# Patient Record
Sex: Female | Born: 1965 | Race: White | Hispanic: No | Marital: Married | Smoking: Former smoker
Health system: Southern US, Community
[De-identification: ages and names within clinical notes are randomized; demographics above are authoritative.]

## PROBLEM LIST (undated history)

## (undated) DIAGNOSIS — Z9989 Dependence on other enabling machines and devices: Secondary | ICD-10-CM

## (undated) DIAGNOSIS — M722 Plantar fascial fibromatosis: Secondary | ICD-10-CM

## (undated) DIAGNOSIS — G5601 Carpal tunnel syndrome, right upper limb: Secondary | ICD-10-CM

## (undated) DIAGNOSIS — I10 Essential (primary) hypertension: Secondary | ICD-10-CM

## (undated) DIAGNOSIS — G4733 Obstructive sleep apnea (adult) (pediatric): Secondary | ICD-10-CM

## (undated) DIAGNOSIS — G8929 Other chronic pain: Secondary | ICD-10-CM

## (undated) DIAGNOSIS — K8020 Calculus of gallbladder without cholecystitis without obstruction: Secondary | ICD-10-CM

## (undated) DIAGNOSIS — E1159 Type 2 diabetes mellitus with other circulatory complications: Secondary | ICD-10-CM

## (undated) DIAGNOSIS — I499 Cardiac arrhythmia, unspecified: Secondary | ICD-10-CM

## (undated) DIAGNOSIS — F419 Anxiety disorder, unspecified: Secondary | ICD-10-CM

## (undated) DIAGNOSIS — M1990 Unspecified osteoarthritis, unspecified site: Secondary | ICD-10-CM

## (undated) DIAGNOSIS — M47816 Spondylosis without myelopathy or radiculopathy, lumbar region: Secondary | ICD-10-CM

## (undated) DIAGNOSIS — F988 Other specified behavioral and emotional disorders with onset usually occurring in childhood and adolescence: Secondary | ICD-10-CM

## (undated) DIAGNOSIS — R413 Other amnesia: Secondary | ICD-10-CM

## (undated) DIAGNOSIS — M4800 Spinal stenosis, site unspecified: Secondary | ICD-10-CM

## (undated) DIAGNOSIS — O903 Peripartum cardiomyopathy: Secondary | ICD-10-CM

## (undated) DIAGNOSIS — E1169 Type 2 diabetes mellitus with other specified complication: Secondary | ICD-10-CM

## (undated) DIAGNOSIS — E1165 Type 2 diabetes mellitus with hyperglycemia: Secondary | ICD-10-CM

## (undated) DIAGNOSIS — R0600 Dyspnea, unspecified: Secondary | ICD-10-CM

## (undated) DIAGNOSIS — I428 Other cardiomyopathies: Secondary | ICD-10-CM

## (undated) DIAGNOSIS — Z87891 Personal history of nicotine dependence: Secondary | ICD-10-CM

## (undated) DIAGNOSIS — G43909 Migraine, unspecified, not intractable, without status migrainosus: Secondary | ICD-10-CM

## (undated) DIAGNOSIS — F32A Depression, unspecified: Secondary | ICD-10-CM

## (undated) DIAGNOSIS — Z6841 Body Mass Index (BMI) 40.0 and over, adult: Secondary | ICD-10-CM

## (undated) DIAGNOSIS — Z0389 Encounter for observation for other suspected diseases and conditions ruled out: Secondary | ICD-10-CM

## (undated) DIAGNOSIS — I509 Heart failure, unspecified: Secondary | ICD-10-CM

## (undated) DIAGNOSIS — F329 Major depressive disorder, single episode, unspecified: Secondary | ICD-10-CM

## (undated) DIAGNOSIS — I5022 Chronic systolic (congestive) heart failure: Secondary | ICD-10-CM

## (undated) DIAGNOSIS — E785 Hyperlipidemia, unspecified: Secondary | ICD-10-CM

## (undated) HISTORY — DX: Anxiety disorder, unspecified: F41.9

## (undated) HISTORY — DX: Type 2 diabetes mellitus with other specified complication: E11.69

## (undated) HISTORY — DX: Other specified behavioral and emotional disorders with onset usually occurring in childhood and adolescence: F98.8

## (undated) HISTORY — DX: Other cardiomyopathies: I42.8

## (undated) HISTORY — PX: OTHER SURGICAL HISTORY: SHX169

## (undated) HISTORY — DX: Type 2 diabetes mellitus with other circulatory complications: E11.59

## (undated) HISTORY — DX: Spondylosis without myelopathy or radiculopathy, lumbar region: M47.816

## (undated) HISTORY — DX: Body Mass Index (BMI) 40.0 and over, adult: Z684

## (undated) HISTORY — PX: CHOLECYSTECTOMY: SHX55

## (undated) HISTORY — DX: Morbid (severe) obesity due to excess calories: E66.01

## (undated) HISTORY — DX: Depression, unspecified: F32.A

## (undated) HISTORY — DX: Migraine, unspecified, not intractable, without status migrainosus: G43.909

## (undated) HISTORY — DX: Essential (primary) hypertension: I10

## (undated) HISTORY — DX: Spinal stenosis, site unspecified: M48.00

## (undated) HISTORY — DX: Obstructive sleep apnea (adult) (pediatric): G47.33

## (undated) HISTORY — DX: Peripartum cardiomyopathy: O90.3

## (undated) HISTORY — DX: Hyperlipidemia, unspecified: E78.5

## (undated) HISTORY — DX: Type 2 diabetes mellitus with hyperglycemia: E11.65

## (undated) HISTORY — DX: Major depressive disorder, single episode, unspecified: F32.9

## (undated) HISTORY — DX: Personal history of nicotine dependence: Z87.891

## (undated) HISTORY — DX: Dependence on other enabling machines and devices: Z99.89

## (undated) HISTORY — DX: Carpal tunnel syndrome, right upper limb: G56.01

## (undated) HISTORY — DX: Plantar fascial fibromatosis: M72.2

## (undated) HISTORY — DX: Encounter for observation for other suspected diseases and conditions ruled out: Z03.89

---

## 2002-04-28 ENCOUNTER — Encounter: Admission: RE | Admit: 2002-04-28 | Discharge: 2002-04-28 | Payer: Self-pay | Admitting: Internal Medicine

## 2002-05-03 ENCOUNTER — Inpatient Hospital Stay (HOSPITAL_COMMUNITY): Admission: AD | Admit: 2002-05-03 | Discharge: 2002-05-03 | Payer: Self-pay | Admitting: Obstetrics and Gynecology

## 2002-05-07 ENCOUNTER — Ambulatory Visit (HOSPITAL_COMMUNITY): Admission: RE | Admit: 2002-05-07 | Discharge: 2002-05-07 | Payer: Self-pay | Admitting: Internal Medicine

## 2002-05-10 ENCOUNTER — Encounter: Admission: RE | Admit: 2002-05-10 | Discharge: 2002-05-10 | Payer: Self-pay | Admitting: Internal Medicine

## 2002-05-12 ENCOUNTER — Encounter: Admission: RE | Admit: 2002-05-12 | Discharge: 2002-05-12 | Payer: Self-pay | Admitting: Internal Medicine

## 2002-05-31 ENCOUNTER — Encounter: Admission: RE | Admit: 2002-05-31 | Discharge: 2002-05-31 | Payer: Self-pay | Admitting: Internal Medicine

## 2002-07-19 ENCOUNTER — Encounter: Admission: RE | Admit: 2002-07-19 | Discharge: 2002-07-19 | Payer: Self-pay | Admitting: Internal Medicine

## 2002-08-31 ENCOUNTER — Encounter: Admission: RE | Admit: 2002-08-31 | Discharge: 2002-08-31 | Payer: Self-pay | Admitting: Internal Medicine

## 2002-09-13 ENCOUNTER — Encounter: Admission: RE | Admit: 2002-09-13 | Discharge: 2002-09-13 | Payer: Self-pay | Admitting: Internal Medicine

## 2002-09-16 ENCOUNTER — Emergency Department (HOSPITAL_COMMUNITY): Admission: EM | Admit: 2002-09-16 | Discharge: 2002-09-16 | Payer: Self-pay | Admitting: *Deleted

## 2002-09-16 ENCOUNTER — Encounter: Payer: Self-pay | Admitting: Emergency Medicine

## 2002-09-20 ENCOUNTER — Encounter (INDEPENDENT_AMBULATORY_CARE_PROVIDER_SITE_OTHER): Payer: Self-pay | Admitting: Specialist

## 2002-09-20 ENCOUNTER — Ambulatory Visit (HOSPITAL_COMMUNITY): Admission: RE | Admit: 2002-09-20 | Discharge: 2002-09-20 | Payer: Self-pay | Admitting: Obstetrics & Gynecology

## 2002-09-29 ENCOUNTER — Encounter: Admission: RE | Admit: 2002-09-29 | Discharge: 2002-09-29 | Payer: Self-pay | Admitting: Obstetrics and Gynecology

## 2002-11-16 ENCOUNTER — Encounter: Admission: RE | Admit: 2002-11-16 | Discharge: 2002-11-16 | Payer: Self-pay | Admitting: Obstetrics and Gynecology

## 2002-12-14 ENCOUNTER — Encounter: Admission: RE | Admit: 2002-12-14 | Discharge: 2002-12-14 | Payer: Self-pay | Admitting: Obstetrics and Gynecology

## 2002-12-20 ENCOUNTER — Encounter: Admission: RE | Admit: 2002-12-20 | Discharge: 2002-12-20 | Payer: Self-pay | Admitting: Internal Medicine

## 2003-02-10 ENCOUNTER — Encounter: Admission: RE | Admit: 2003-02-10 | Discharge: 2003-02-10 | Payer: Self-pay | Admitting: Internal Medicine

## 2003-05-19 ENCOUNTER — Encounter: Admission: RE | Admit: 2003-05-19 | Discharge: 2003-05-19 | Payer: Self-pay | Admitting: Internal Medicine

## 2003-11-17 ENCOUNTER — Ambulatory Visit: Payer: Self-pay | Admitting: Internal Medicine

## 2003-11-23 ENCOUNTER — Ambulatory Visit: Payer: Self-pay | Admitting: Cardiology

## 2003-12-19 ENCOUNTER — Ambulatory Visit: Payer: Self-pay | Admitting: Internal Medicine

## 2003-12-20 ENCOUNTER — Ambulatory Visit: Payer: Self-pay | Admitting: Obstetrics and Gynecology

## 2004-01-10 ENCOUNTER — Ambulatory Visit: Payer: Self-pay | Admitting: Internal Medicine

## 2004-02-21 ENCOUNTER — Emergency Department (HOSPITAL_COMMUNITY): Admission: EM | Admit: 2004-02-21 | Discharge: 2004-02-21 | Payer: Self-pay | Admitting: Family Medicine

## 2004-04-19 ENCOUNTER — Ambulatory Visit: Payer: Self-pay | Admitting: Internal Medicine

## 2004-05-02 ENCOUNTER — Ambulatory Visit: Payer: Self-pay | Admitting: Internal Medicine

## 2004-05-21 ENCOUNTER — Ambulatory Visit: Payer: Self-pay | Admitting: Cardiology

## 2004-06-16 ENCOUNTER — Emergency Department (HOSPITAL_COMMUNITY): Admission: EM | Admit: 2004-06-16 | Discharge: 2004-06-16 | Payer: Self-pay | Admitting: Family Medicine

## 2004-06-19 ENCOUNTER — Ambulatory Visit: Payer: Self-pay

## 2004-06-30 ENCOUNTER — Emergency Department (HOSPITAL_COMMUNITY): Admission: EM | Admit: 2004-06-30 | Discharge: 2004-06-30 | Payer: Self-pay | Admitting: Family Medicine

## 2004-07-02 ENCOUNTER — Ambulatory Visit: Payer: Self-pay | Admitting: Internal Medicine

## 2004-08-22 ENCOUNTER — Inpatient Hospital Stay (HOSPITAL_COMMUNITY): Admission: AD | Admit: 2004-08-22 | Discharge: 2004-08-22 | Payer: Self-pay | Admitting: *Deleted

## 2004-08-30 ENCOUNTER — Ambulatory Visit: Payer: Self-pay | Admitting: Obstetrics and Gynecology

## 2004-09-30 ENCOUNTER — Emergency Department (HOSPITAL_COMMUNITY): Admission: EM | Admit: 2004-09-30 | Discharge: 2004-09-30 | Payer: Self-pay | Admitting: Emergency Medicine

## 2004-10-11 ENCOUNTER — Ambulatory Visit: Payer: Self-pay | Admitting: *Deleted

## 2004-12-31 ENCOUNTER — Ambulatory Visit: Payer: Self-pay | Admitting: Cardiology

## 2005-01-03 ENCOUNTER — Ambulatory Visit: Payer: Self-pay | Admitting: Internal Medicine

## 2005-03-08 ENCOUNTER — Ambulatory Visit: Payer: Self-pay | Admitting: Internal Medicine

## 2005-03-23 ENCOUNTER — Emergency Department (HOSPITAL_COMMUNITY): Admission: EM | Admit: 2005-03-23 | Discharge: 2005-03-23 | Payer: Self-pay | Admitting: Emergency Medicine

## 2005-04-05 ENCOUNTER — Ambulatory Visit: Payer: Self-pay | Admitting: Family Medicine

## 2005-04-22 ENCOUNTER — Ambulatory Visit: Payer: Self-pay | Admitting: Internal Medicine

## 2005-04-25 ENCOUNTER — Encounter (INDEPENDENT_AMBULATORY_CARE_PROVIDER_SITE_OTHER): Payer: Self-pay | Admitting: Internal Medicine

## 2005-04-25 LAB — CONVERTED CEMR LAB: Pap Smear: NORMAL

## 2005-06-03 ENCOUNTER — Ambulatory Visit (HOSPITAL_COMMUNITY): Admission: RE | Admit: 2005-06-03 | Discharge: 2005-06-03 | Payer: Self-pay | Admitting: *Deleted

## 2005-06-05 ENCOUNTER — Encounter: Admission: RE | Admit: 2005-06-05 | Discharge: 2005-06-05 | Payer: Self-pay

## 2005-06-17 ENCOUNTER — Ambulatory Visit: Payer: Self-pay | Admitting: Cardiology

## 2005-09-06 ENCOUNTER — Ambulatory Visit: Payer: Self-pay | Admitting: Internal Medicine

## 2005-12-27 ENCOUNTER — Encounter: Admission: RE | Admit: 2005-12-27 | Discharge: 2005-12-27 | Payer: Self-pay | Admitting: Obstetrics and Gynecology

## 2006-01-02 ENCOUNTER — Ambulatory Visit: Payer: Self-pay | Admitting: Internal Medicine

## 2006-01-09 ENCOUNTER — Ambulatory Visit: Payer: Self-pay | Admitting: Cardiology

## 2006-04-28 ENCOUNTER — Ambulatory Visit: Payer: Self-pay | Admitting: Internal Medicine

## 2006-05-26 ENCOUNTER — Ambulatory Visit: Payer: Self-pay | Admitting: Internal Medicine

## 2006-06-05 ENCOUNTER — Encounter: Admission: RE | Admit: 2006-06-05 | Discharge: 2006-06-05 | Payer: Self-pay | Admitting: Internal Medicine

## 2006-07-04 ENCOUNTER — Ambulatory Visit: Payer: Self-pay | Admitting: Cardiology

## 2006-07-04 LAB — CONVERTED CEMR LAB
BUN: 18 mg/dL (ref 6–23)
CO2: 29 meq/L (ref 19–32)
Calcium: 9.1 mg/dL (ref 8.4–10.5)
Chloride: 108 meq/L (ref 96–112)
Cholesterol: 163 mg/dL (ref 0–200)
Creatinine, Ser: 0.8 mg/dL (ref 0.4–1.2)
GFR calc Af Amer: 102 mL/min
GFR calc non Af Amer: 84 mL/min
Glucose, Bld: 92 mg/dL (ref 70–99)
HDL: 37.9 mg/dL — ABNORMAL LOW (ref >39.0)
LDL Cholesterol: 109 mg/dL — ABNORMAL HIGH (ref 0–99)
Potassium: 3.8 meq/L (ref 3.5–5.1)
Sodium: 141 meq/L (ref 135–145)
Total CHOL/HDL Ratio: 4.3
Triglycerides: 83 mg/dL (ref 0–149)
VLDL: 17 mg/dL (ref 0–40)

## 2006-07-12 ENCOUNTER — Encounter (INDEPENDENT_AMBULATORY_CARE_PROVIDER_SITE_OTHER): Payer: Self-pay | Admitting: Internal Medicine

## 2006-07-12 DIAGNOSIS — G43909 Migraine, unspecified, not intractable, without status migrainosus: Secondary | ICD-10-CM | POA: Insufficient documentation

## 2006-07-12 DIAGNOSIS — Z6841 Body Mass Index (BMI) 40.0 and over, adult: Secondary | ICD-10-CM

## 2006-07-12 DIAGNOSIS — F988 Other specified behavioral and emotional disorders with onset usually occurring in childhood and adolescence: Secondary | ICD-10-CM | POA: Insufficient documentation

## 2006-07-12 DIAGNOSIS — I428 Other cardiomyopathies: Secondary | ICD-10-CM

## 2006-07-12 HISTORY — DX: Other cardiomyopathies: I42.8

## 2006-07-12 HISTORY — DX: Morbid (severe) obesity due to excess calories: E66.01

## 2006-07-15 ENCOUNTER — Ambulatory Visit: Payer: Self-pay | Admitting: Cardiology

## 2006-07-25 ENCOUNTER — Ambulatory Visit: Payer: Self-pay | Admitting: Cardiology

## 2006-09-19 ENCOUNTER — Emergency Department (HOSPITAL_COMMUNITY): Admission: EM | Admit: 2006-09-19 | Discharge: 2006-09-19 | Payer: Self-pay | Admitting: Emergency Medicine

## 2006-10-02 ENCOUNTER — Other Ambulatory Visit: Payer: Self-pay

## 2006-10-03 ENCOUNTER — Inpatient Hospital Stay (HOSPITAL_COMMUNITY): Admission: AD | Admit: 2006-10-03 | Discharge: 2006-10-06 | Payer: Self-pay | Admitting: Psychiatry

## 2006-10-03 ENCOUNTER — Other Ambulatory Visit: Payer: Self-pay | Admitting: Emergency Medicine

## 2006-10-03 ENCOUNTER — Ambulatory Visit: Payer: Self-pay | Admitting: Psychiatry

## 2006-10-24 ENCOUNTER — Ambulatory Visit (HOSPITAL_COMMUNITY): Admission: RE | Admit: 2006-10-24 | Discharge: 2006-10-24 | Payer: Self-pay | Admitting: Internal Medicine

## 2006-12-11 ENCOUNTER — Encounter: Admission: RE | Admit: 2006-12-11 | Discharge: 2006-12-11 | Payer: Self-pay | Admitting: Internal Medicine

## 2007-01-05 ENCOUNTER — Ambulatory Visit: Payer: Self-pay | Admitting: Cardiology

## 2007-01-13 ENCOUNTER — Ambulatory Visit: Payer: Self-pay

## 2007-01-13 ENCOUNTER — Encounter: Payer: Self-pay | Admitting: Cardiology

## 2007-02-12 ENCOUNTER — Ambulatory Visit: Payer: Self-pay | Admitting: Internal Medicine

## 2007-02-12 LAB — CONVERTED CEMR LAB
ALT: 18 units/L (ref 0–35)
AST: 21 units/L (ref 0–37)
Albumin: 4.3 g/dL (ref 3.5–5.2)
Alkaline Phosphatase: 36 units/L — ABNORMAL LOW (ref 39–117)
BUN: 15 mg/dL (ref 6–23)
Basophils Absolute: 0.1 10*3/uL (ref 0.0–0.1)
Basophils Relative: 1 % (ref 0–1)
CO2: 24 meq/L (ref 19–32)
Calcium: 9.3 mg/dL (ref 8.4–10.5)
Chloride: 104 meq/L (ref 96–112)
Cholesterol: 187 mg/dL (ref 0–200)
Creatinine, Ser: 0.75 mg/dL (ref 0.40–1.20)
Eosinophils Absolute: 0.2 10*3/uL (ref 0.2–0.7)
Eosinophils Relative: 2 % (ref 0–5)
Glucose, Bld: 77 mg/dL (ref 70–99)
HCT: 47.3 % — ABNORMAL HIGH (ref 36.0–46.0)
HDL: 62 mg/dL (ref >39)
Hemoglobin: 15.6 g/dL — ABNORMAL HIGH (ref 12.0–15.0)
LDL Cholesterol: 104 mg/dL — ABNORMAL HIGH (ref 0–99)
Lymphocytes Relative: 28 % (ref 12–46)
Lymphs Abs: 2.9 10*3/uL (ref 0.7–4.0)
MCHC: 33 g/dL (ref 30.0–36.0)
MCV: 89.8 fL (ref 78.0–100.0)
Monocytes Absolute: 0.7 10*3/uL (ref 0.1–1.0)
Monocytes Relative: 7 % (ref 3–12)
Neutro Abs: 6.3 10*3/uL (ref 1.7–7.7)
Neutrophils Relative %: 63 % (ref 43–77)
Platelets: 269 10*3/uL (ref 150–400)
Potassium: 4.3 meq/L (ref 3.5–5.3)
RBC: 5.27 M/uL — ABNORMAL HIGH (ref 3.87–5.11)
RDW: 14.2 % (ref 11.5–15.5)
Sodium: 140 meq/L (ref 135–145)
TSH: 1.671 microintl units/mL (ref 0.350–5.50)
Total Bilirubin: 0.6 mg/dL (ref 0.3–1.2)
Total CHOL/HDL Ratio: 3
Total Protein: 7.3 g/dL (ref 6.0–8.3)
Triglycerides: 103 mg/dL (ref <150)
VLDL: 21 mg/dL (ref 0–40)
WBC: 10 10*3/uL (ref 4.0–10.5)

## 2007-04-09 ENCOUNTER — Ambulatory Visit: Payer: Self-pay | Admitting: Internal Medicine

## 2007-04-09 ENCOUNTER — Encounter: Payer: Self-pay | Admitting: Internal Medicine

## 2007-04-09 LAB — CONVERTED CEMR LAB
Chlamydia, DNA Probe: NEGATIVE
GC Probe Amp, Genital: NEGATIVE

## 2007-04-30 ENCOUNTER — Ambulatory Visit: Payer: Self-pay | Admitting: Internal Medicine

## 2007-05-18 ENCOUNTER — Ambulatory Visit: Payer: Self-pay | Admitting: Cardiology

## 2007-05-18 LAB — CONVERTED CEMR LAB: TSH: 2.92 microintl units/mL (ref 0.35–5.50)

## 2007-05-25 ENCOUNTER — Emergency Department (HOSPITAL_COMMUNITY): Admission: EM | Admit: 2007-05-25 | Discharge: 2007-05-25 | Payer: Self-pay | Admitting: Emergency Medicine

## 2007-06-18 ENCOUNTER — Encounter: Admission: RE | Admit: 2007-06-18 | Discharge: 2007-06-18 | Payer: Self-pay | Admitting: Internal Medicine

## 2007-06-23 ENCOUNTER — Ambulatory Visit: Payer: Self-pay | Admitting: Cardiology

## 2007-06-23 LAB — CONVERTED CEMR LAB
BUN: 18 mg/dL (ref 6–23)
CO2: 29 meq/L (ref 19–32)
Calcium: 9.8 mg/dL (ref 8.4–10.5)
Chloride: 106 meq/L (ref 96–112)
Creatinine, Ser: 0.8 mg/dL (ref 0.4–1.2)
GFR calc Af Amer: 101 mL/min
GFR calc non Af Amer: 84 mL/min
Glucose, Bld: 83 mg/dL (ref 70–99)
Magnesium: 2.2 mg/dL (ref 1.5–2.5)
Potassium: 4.4 meq/L (ref 3.5–5.1)
Sodium: 140 meq/L (ref 135–145)

## 2007-07-01 ENCOUNTER — Ambulatory Visit: Payer: Self-pay | Admitting: Internal Medicine

## 2007-07-12 ENCOUNTER — Emergency Department (HOSPITAL_COMMUNITY): Admission: EM | Admit: 2007-07-12 | Discharge: 2007-07-12 | Payer: Self-pay | Admitting: Family Medicine

## 2007-07-14 ENCOUNTER — Emergency Department (HOSPITAL_COMMUNITY): Admission: EM | Admit: 2007-07-14 | Discharge: 2007-07-14 | Payer: Self-pay | Admitting: Emergency Medicine

## 2007-11-24 ENCOUNTER — Ambulatory Visit: Payer: Self-pay | Admitting: Internal Medicine

## 2007-12-04 ENCOUNTER — Ambulatory Visit: Payer: Self-pay | Admitting: Cardiology

## 2008-04-14 ENCOUNTER — Ambulatory Visit: Payer: Self-pay | Admitting: Internal Medicine

## 2008-04-14 LAB — CONVERTED CEMR LAB
ALT: 24 units/L (ref 0–35)
AST: 22 units/L (ref 0–37)
Albumin: 4.1 g/dL (ref 3.5–5.2)
Alkaline Phosphatase: 35 units/L — ABNORMAL LOW (ref 39–117)
BUN: 20 mg/dL (ref 6–23)
Basophils Absolute: 0.1 10*3/uL (ref 0.0–0.1)
Basophils Relative: 1 % (ref 0–1)
CO2: 25 meq/L (ref 19–32)
Calcium: 9.7 mg/dL (ref 8.4–10.5)
Chloride: 105 meq/L (ref 96–112)
Cholesterol: 241 mg/dL — ABNORMAL HIGH (ref 0–200)
Creatinine, Ser: 0.72 mg/dL (ref 0.40–1.20)
Eosinophils Absolute: 0.2 10*3/uL (ref 0.0–0.7)
Eosinophils Relative: 2 % (ref 0–5)
Glucose, Bld: 129 mg/dL — ABNORMAL HIGH (ref 70–99)
HCT: 47.4 % — ABNORMAL HIGH (ref 36.0–46.0)
HDL: 67 mg/dL (ref >39)
Hemoglobin: 15.3 g/dL — ABNORMAL HIGH (ref 12.0–15.0)
LDL Cholesterol: 146 mg/dL — ABNORMAL HIGH (ref 0–99)
Lymphocytes Relative: 21 % (ref 12–46)
Lymphs Abs: 2 10*3/uL (ref 0.7–4.0)
MCHC: 32.3 g/dL (ref 30.0–36.0)
MCV: 89.9 fL (ref 78.0–100.0)
Monocytes Absolute: 0.6 10*3/uL (ref 0.1–1.0)
Monocytes Relative: 6 % (ref 3–12)
Neutro Abs: 6.5 10*3/uL (ref 1.7–7.7)
Neutrophils Relative %: 70 % (ref 43–77)
Platelets: 259 10*3/uL (ref 150–400)
Potassium: 4.2 meq/L (ref 3.5–5.3)
RBC: 5.27 M/uL — ABNORMAL HIGH (ref 3.87–5.11)
RDW: 14.4 % (ref 11.5–15.5)
Sodium: 141 meq/L (ref 135–145)
TSH: 3.656 microintl units/mL (ref 0.350–4.50)
Total Bilirubin: 0.6 mg/dL (ref 0.3–1.2)
Total CHOL/HDL Ratio: 3.6
Total Protein: 7.4 g/dL (ref 6.0–8.3)
Triglycerides: 142 mg/dL (ref <150)
VLDL: 28 mg/dL (ref 0–40)
WBC: 9.3 10*3/uL (ref 4.0–10.5)

## 2008-05-09 ENCOUNTER — Ambulatory Visit: Payer: Self-pay | Admitting: Internal Medicine

## 2008-05-09 LAB — CONVERTED CEMR LAB
ALT: 20 units/L (ref 0–35)
AST: 21 units/L (ref 0–37)
Albumin: 4.1 g/dL (ref 3.5–5.2)
Alkaline Phosphatase: 36 units/L — ABNORMAL LOW (ref 39–117)
BUN: 12 mg/dL (ref 6–23)
Basophils Absolute: 0.1 10*3/uL (ref 0.0–0.1)
Basophils Relative: 1 % (ref 0–1)
CO2: 24 meq/L (ref 19–32)
Calcium: 9.4 mg/dL (ref 8.4–10.5)
Chloride: 104 meq/L (ref 96–112)
Cholesterol: 237 mg/dL — ABNORMAL HIGH (ref 0–200)
Creatinine, Ser: 0.72 mg/dL (ref 0.40–1.20)
Eosinophils Absolute: 0.2 10*3/uL (ref 0.0–0.7)
Eosinophils Relative: 2 % (ref 0–5)
Glucose, Bld: 89 mg/dL (ref 70–99)
HCT: 47.5 % — ABNORMAL HIGH (ref 36.0–46.0)
HDL: 62 mg/dL (ref >39)
Hemoglobin: 15 g/dL (ref 12.0–15.0)
LDL Cholesterol: 148 mg/dL — ABNORMAL HIGH (ref 0–99)
Lymphocytes Relative: 20 % (ref 12–46)
Lymphs Abs: 2.1 10*3/uL (ref 0.7–4.0)
MCHC: 31.6 g/dL (ref 30.0–36.0)
MCV: 89.3 fL (ref 78.0–100.0)
Monocytes Absolute: 0.6 10*3/uL (ref 0.1–1.0)
Monocytes Relative: 6 % (ref 3–12)
Neutro Abs: 7.6 10*3/uL (ref 1.7–7.7)
Neutrophils Relative %: 72 % (ref 43–77)
Platelets: 275 10*3/uL (ref 150–400)
Potassium: 4.2 meq/L (ref 3.5–5.3)
RBC: 5.32 M/uL — ABNORMAL HIGH (ref 3.87–5.11)
RDW: 14.2 % (ref 11.5–15.5)
Sodium: 141 meq/L (ref 135–145)
Total Bilirubin: 0.5 mg/dL (ref 0.3–1.2)
Total CHOL/HDL Ratio: 3.8
Total Protein: 7.2 g/dL (ref 6.0–8.3)
Triglycerides: 136 mg/dL (ref <150)
VLDL: 27 mg/dL (ref 0–40)
WBC: 10.5 10*3/uL (ref 4.0–10.5)

## 2008-06-03 ENCOUNTER — Ambulatory Visit: Payer: Self-pay | Admitting: Cardiology

## 2008-06-03 ENCOUNTER — Encounter: Payer: Self-pay | Admitting: Cardiology

## 2008-06-03 DIAGNOSIS — I152 Hypertension secondary to endocrine disorders: Secondary | ICD-10-CM

## 2008-06-03 DIAGNOSIS — E1159 Type 2 diabetes mellitus with other circulatory complications: Secondary | ICD-10-CM | POA: Insufficient documentation

## 2008-06-03 DIAGNOSIS — I10 Essential (primary) hypertension: Secondary | ICD-10-CM

## 2008-06-03 HISTORY — DX: Hypertension secondary to endocrine disorders: I15.2

## 2008-06-03 HISTORY — DX: Type 2 diabetes mellitus with other circulatory complications: E11.59

## 2008-06-08 ENCOUNTER — Telehealth (INDEPENDENT_AMBULATORY_CARE_PROVIDER_SITE_OTHER): Payer: Self-pay | Admitting: *Deleted

## 2008-06-14 ENCOUNTER — Telehealth: Payer: Self-pay | Admitting: Cardiology

## 2008-09-22 ENCOUNTER — Encounter: Admission: RE | Admit: 2008-09-22 | Discharge: 2008-09-22 | Payer: Self-pay | Admitting: Internal Medicine

## 2008-09-28 ENCOUNTER — Telehealth: Payer: Self-pay | Admitting: Cardiology

## 2008-10-10 ENCOUNTER — Telehealth: Payer: Self-pay | Admitting: Cardiology

## 2008-10-26 LAB — CONVERTED CEMR LAB: Pap Smear: NEGATIVE

## 2008-11-09 ENCOUNTER — Ambulatory Visit: Payer: Self-pay | Admitting: Internal Medicine

## 2008-11-09 ENCOUNTER — Telehealth (INDEPENDENT_AMBULATORY_CARE_PROVIDER_SITE_OTHER): Payer: Self-pay | Admitting: *Deleted

## 2008-11-10 ENCOUNTER — Ambulatory Visit: Payer: Self-pay | Admitting: Internal Medicine

## 2008-11-11 ENCOUNTER — Telehealth (INDEPENDENT_AMBULATORY_CARE_PROVIDER_SITE_OTHER): Payer: Self-pay | Admitting: *Deleted

## 2008-11-11 ENCOUNTER — Inpatient Hospital Stay (HOSPITAL_COMMUNITY): Admission: AD | Admit: 2008-11-11 | Discharge: 2008-11-11 | Payer: Self-pay | Admitting: Obstetrics & Gynecology

## 2008-11-17 ENCOUNTER — Ambulatory Visit: Payer: Self-pay | Admitting: Obstetrics & Gynecology

## 2008-11-17 ENCOUNTER — Encounter: Payer: Self-pay | Admitting: Advanced Practice Midwife

## 2008-11-17 ENCOUNTER — Other Ambulatory Visit: Admission: RE | Admit: 2008-11-17 | Discharge: 2008-11-17 | Payer: Self-pay | Admitting: Obstetrics & Gynecology

## 2008-12-02 ENCOUNTER — Ambulatory Visit: Payer: Self-pay | Admitting: Obstetrics & Gynecology

## 2008-12-27 ENCOUNTER — Emergency Department (HOSPITAL_COMMUNITY): Admission: EM | Admit: 2008-12-27 | Discharge: 2008-12-27 | Payer: Self-pay | Admitting: Emergency Medicine

## 2008-12-27 ENCOUNTER — Telehealth (INDEPENDENT_AMBULATORY_CARE_PROVIDER_SITE_OTHER): Payer: Self-pay | Admitting: *Deleted

## 2008-12-29 ENCOUNTER — Telehealth (INDEPENDENT_AMBULATORY_CARE_PROVIDER_SITE_OTHER): Payer: Self-pay | Admitting: *Deleted

## 2008-12-30 ENCOUNTER — Ambulatory Visit: Payer: Self-pay | Admitting: Internal Medicine

## 2009-01-16 ENCOUNTER — Ambulatory Visit: Payer: Self-pay | Admitting: Cardiology

## 2009-01-16 DIAGNOSIS — R9431 Abnormal electrocardiogram [ECG] [EKG]: Secondary | ICD-10-CM | POA: Insufficient documentation

## 2009-02-15 ENCOUNTER — Ambulatory Visit: Payer: Self-pay | Admitting: Internal Medicine

## 2009-03-19 ENCOUNTER — Emergency Department (HOSPITAL_COMMUNITY): Admission: EM | Admit: 2009-03-19 | Discharge: 2009-03-19 | Payer: Self-pay | Admitting: Family Medicine

## 2009-03-20 ENCOUNTER — Telehealth: Payer: Self-pay | Admitting: Cardiology

## 2009-03-23 ENCOUNTER — Encounter: Payer: Self-pay | Admitting: Cardiology

## 2009-04-05 ENCOUNTER — Ambulatory Visit: Payer: Self-pay | Admitting: Internal Medicine

## 2009-04-05 DIAGNOSIS — G5600 Carpal tunnel syndrome, unspecified upper limb: Secondary | ICD-10-CM | POA: Insufficient documentation

## 2009-04-07 ENCOUNTER — Telehealth: Payer: Self-pay | Admitting: Cardiology

## 2009-04-26 ENCOUNTER — Telehealth: Payer: Self-pay | Admitting: Cardiology

## 2009-07-28 ENCOUNTER — Telehealth: Payer: Self-pay | Admitting: Cardiology

## 2009-08-23 ENCOUNTER — Telehealth: Payer: Self-pay | Admitting: Cardiology

## 2009-08-23 ENCOUNTER — Ambulatory Visit: Payer: Self-pay | Admitting: Cardiology

## 2009-10-10 ENCOUNTER — Encounter: Admission: RE | Admit: 2009-10-10 | Discharge: 2009-10-10 | Payer: Self-pay | Admitting: Internal Medicine

## 2009-10-10 LAB — HM MAMMOGRAPHY: HM Mammogram: NEGATIVE

## 2009-12-04 ENCOUNTER — Encounter: Payer: Self-pay | Admitting: Cardiology

## 2010-03-18 ENCOUNTER — Encounter: Payer: Self-pay | Admitting: Internal Medicine

## 2010-03-27 NOTE — Progress Notes (Signed)
Summary: question on lamisil   Phone Note Call from Patient Call back at [phone]   Caller: Patient Reason for Call: Talk to Nurse Summary of Call: pt has bad fungus ,legs,feet,hands, went to dermatologist and was given lamisil 250 mg. pt wants to know if this is ok. call pt [phone] Initial call taken by: Ammie del Tomasa Rand,  June 14, 2008 11:00 AM    OK to use Lamisil as ordered. LM on VM.  Charolotte Capuchin, RN  June 14, 2008 11:55 AM

## 2010-03-27 NOTE — Assessment & Plan Note (Signed)
Summary: [facility]   Visit Type:  Follow-up Primary Provider:  Donia Guiles, MD  CC:  Previous Cardiomyopathy.  History of Present Illness: The patient presents for follow up of the above.  As has been the case on multiple visits, she has no acute cardiac complaints.  Her problems all center around depression, anxiety and I am convinced mania.  She has palpitations related to stress.  She has SOB because of weight gain secondary to over eating to deal with the stress.  She does not have PND or orthopnea or chest pain.  Current Medications (verified): 1)  Coreg 25 Mg Tabs (Carvedilol) .[name].. 1tpobid 2)  Furosemide 20 Mg Tabs (Furosemide) .[name].. 1tpoqam 3)  Lamictal  Tabs (Lamotrigine Tabs) .... Current Dosing and Medication Managed By Mental Health 4)  Adderall  Tabs (Amphetamine-Dextroamphetamine Tabs) .... Current Dosing and Medication Managed By Mental Health 5)  Enalapril Maleate 20 Mg Tabs (Enalapril Maleate) .[name].. 1tpobid 6)  Ibuprofen 800 Mg Tabs (Ibuprofen) .[name].. 1tpoq8hprn For Back Pain  Allergies (verified): 1)  ! Penicillin   Past History:  Past Medical History:    Reviewed history from 06/02/2008 and no changes required:     Cardiomyopathy (presumed postpartum previously, EF now 60%),      obesity, hypertension, PVCs, depression, and attention deficit disorder  Past Surgical History:    Reviewed history from 06/02/2008 and no changes required:    Gastric bypass  Review of Systems       As stated in the HPI and negative for all other systems.  Vital Signs:  Patient profile:   45 year old female Height:      64 inches Weight:      279 pounds BMI:     48.06 Pulse rate:   85 / minute BP sitting:   140 / 97  (right arm)  Physical Exam  General:  Well developed, well nourished, in no acute distress. Head:  normocephalic and atraumatic Eyes:  PERRLA/EOM intact; conjunctiva and lids normal. Mouth:  Teeth, gums and palate normal. Oral mucosa normal. Neck:   Neck supple, no JVD. No masses, thyromegaly or abnormal cervical nodes. Chest Wall:  no deformities or breast masses noted Lungs:  Clear bilaterally to auscultation and percussion. Heart:  Non-displaced PMI, chest non-tender; regular rate and rhythm, S1, S2 without murmurs, rubs or gallops. Carotid upstroke normal, no bruit. Normal abdominal aortic size, no bruits. Femorals normal pulses, no bruits. Pedals normal pulses. No edema, no varicosities. Abdomen:  Bowel sounds positive; abdomen soft and non-tender without masses, organomegaly, or hernias noted. No hepatosplenomegaly, obese Msk:  Back normal, normal gait. Muscle strength and tone normal. Pulses:  pulses normal in all 4 extremities Extremities:  No clubbing or cyanosis. Neurologic:  Alert and oriented x 3. Psych:  depressed affect, anxious, and agitated.     EKG  Procedure date:  06/03/2008  Findings:      Normal sinus rhythm. No acute ST T wave changes.  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  There is no evidence that it is reduced now.  She will continue with the meds as listed.  Problem # 2:  ADHD (ICD-314.01) Her psychiatric problems or her major issue. I have encouraged close followup with mental health.  Problem # 3:  ESSENTIAL HYPERTENSION, BENIGN (ICD-401.1) Her blood pressure is borderline. She weight loss. I will not change her meds.  She understands the goal is systolic less than 140 and  diastolic less than 90  Patient Instructions: 1)  I will see her in 6 months.

## 2010-03-27 NOTE — Progress Notes (Signed)
Summary: triage/blood pressure  Phone Note Call from Patient   Reason for Call: Talk to Nurse Summary of Call: Patient needs F/U on Blood Pressure.Marland KitchenMarland KitchenMarland KitchenDiscussed with Dr. Reche Dixon.Marland KitchenMarland KitchenOkay to put in Acute...to have blood pressure evaluated.... Initial call taken by: Conchita Paris,  December 29, 2008 2:46 PM

## 2010-03-27 NOTE — Letter (Signed)
Summary: [facility] Progress Note   [facility] Progress Note   Imported By: Roderic Ovens 01/03/2010 10:11:48  _____________________________________________________________________  External Attachment:    Type:   Image     Comment:   External Document

## 2010-03-27 NOTE — Assessment & Plan Note (Signed)
Summary: new / medicare/ # / cd   Vital Signs:  Patient profile:   45 year old female Height:      64 inches (162.56 cm) Weight:      284.2 pounds (129.18 kg) O2 Sat:      97 % on Room air Temp:     99.1 degrees F (37.28 degrees C) oral Pulse rate:   90 / minute BP sitting:   142 / 92  (left arm) Cuff size:   large  Vitals Entered By: Orlan Leavens (April 05, 2009 8:58 AM)  O2 Flow:  Room air CC: New patient Is Patient Diabetic? No Pain Assessment Patient in pain? no        Primary Care Provider:  Newt Lukes MD  CC:  New patient.  History of Present Illness: new pt to me and our division - here to est care with PCP  1) cardiomyopathy -  follows with cards ([facility]) for same - reports compliance with ongoing medical treatment and no changes in medication dose or frequency. denies adverse side effects related to current therapy. no SOB or edema, no CP  2) depression/anxiety/ADHD - follows with behav health for med mgmt of same - has appointment this week reports mood and behaviour have been stable without flares  3) HA and migraine hx - generally controlled with lamicatal, as needed ibuprofen no change in meds - compliant as rx'd  4) HTN - recently begun on amlodipine in addition to other cardiac meds deneis swelling or edema, no HA or vision changes, no weakness  5) c/o carpal tunnel symptoms - right side prev only occ symptoms (<1-2/m), now almost daily worst awaking in AM not using (nevewr rx'd) wrist splint not interested in surg  Preventive Screening-Counseling & Management  Alcohol-Tobacco     Alcohol drinks/day: 0     Alcohol Counseling: not indicated; patient does not drink     Smoking Status: quit     Tobacco Counseling: not to resume use of tobacco products  Caffeine-Diet-Exercise     Does Patient Exercise: no     Exercise Counseling: to improve exercise regimen  Safety-Violence-Falls     Seat Belt Use: yes     Firearms in the  Home: no firearms in the home     Smoke Detectors: yes     Violence in the Home: no risk noted     Fall Risk Counseling: not indicated; no significant falls noted  Clinical Review Panels:  Prevention   Last Mammogram:  No specific mammographic evidence of malignancy.   (10/26/2008)   Last Pap Smear:  Interpretation/Result:Negative for intraepithelial Lesion or Malignancy.    (10/26/2008)  Lipid Management   Cholesterol:  237 (05/09/2008)   LDL (bad choesterol):  148 (05/09/2008)   HDL (good cholesterol):  62 (05/09/2008)  CBC   WBC:  10.5 (05/09/2008)   RBC:  5.32 (05/09/2008)   Hgb:  15.0 (05/09/2008)   Hct:  47.5 (05/09/2008)   Platelets:  275 (05/09/2008)   MCV  89.3 (05/09/2008)   MCHC  31.6 (05/09/2008)   RDW  14.2 (05/09/2008)   PMN:  72 (05/09/2008)   Lymphs:  20 (05/09/2008)   Monos:  6 (05/09/2008)   Eosinophils:  2 (05/09/2008)   Basophil:  1 (05/09/2008)  Complete Metabolic Panel   Glucose:  89 (05/09/2008)   Sodium:  141 (05/09/2008)   Potassium:  4.2 (05/09/2008)   Chloride:  104 (05/09/2008)   CO2:  24 (05/09/2008)   BUN:  12 (05/09/2008)   Creatinine:  0.72 (05/09/2008)   Albumin:  4.1 (05/09/2008)   Total Protein:  7.2 (05/09/2008)   Calcium:  9.4 (05/09/2008)   Total Bili:  0.5 (05/09/2008)   Alk Phos:  36 (05/09/2008)   SGPT (ALT):  20 (05/09/2008)   SGOT (AST):  21 (05/09/2008)   Current Medications (verified): 1)  Furosemide 20 Mg Tabs (Furosemide) .[name].. 1tpoqam 2)  Adderall  Tabs (Amphetamine-Dextroamphetamine Tabs) .... Current Dosing and Medication Managed By Mental Health 3)  Enalapril Maleate 20 Mg Tabs (Enalapril Maleate) .[name].. 1tpobid 4)  Ibuprofen 800 Mg Tabs (Ibuprofen) .[name].. 1tpoq8hprn For Back Pain 5)  Carvedilol 25 Mg Tabs (Carvedilol) .... Take 1 Tablet By Mouth Twice A Day 6)  Carvedilol 6.25 Mg Tabs (Carvedilol) .... Take 1 Tablet By Mouth Twice A Day 7)  Klor-Con M10 10 Meq Cr-Tabs (Potassium Chloride Crys Cr) .[name].. 1 By Mouth  Daily 8)  Amlodipine Besylate 5 Mg Tabs (Amlodipine Besylate) .... One By Mouth Daily 9)  Lamictal 200 Mg Tabs (Lamotrigine) .... Take 1 Two Times A Day 10)  Clonazepam 0.5 Mg Tabs (Clonazepam) .... Take 1 Two Times A Day  Allergies: 1)  ! Penicillin 2)  ! * Cymbalta  Past History:  Past Medical History: Cardiomyopathy (presumed postpartum previously, EF now 60%) obesity hypertension  PVCs depression/anxiety ADHD  MD rooster: [facility]  Past Surgical History: Gastric bypass Child birth (2003) Cardiomyopathy (2003)  Family History: Reviewed history from 06/02/2008 and no changes required.  Significant for hypertension, diabetes, and colitis. Family History of Alcoholism/Addiction  Social History: She is a Futures trader and disabled.   She denies tobacco - quit 2005; no alcohol or drug use. married, lives with spouse (when he is not traveling) and son Smoking Status:  quit Does Patient Exercise:  no Seat Belt Use:  yes  Review of Systems       see HPI above. I have reviewed all other systems and they were negative.   Physical Exam  General:  Well developed, well nourished, in no acute distress. overweight-appearing.   Eyes:  vision grossly intact; pupils equal, round and reactive to light.  conjunctiva and lids normal.    Ears:  normal pinnae bilaterally, without erythema, swelling, or tenderness to palpation. TMs clear, without effusion, or cerumen impaction. Hearing grossly normal bilaterally   Mouth:  teeth and gums in good repair; mucous membranes moist, without lesions or ulcers. oropharynx clear without exudate, no erythema.  Neck:  No deformities, masses, or tenderness noted. Lungs:  normal respiratory effort, no intercostal retractions or use of accessory muscles; normal breath sounds bilaterally - no crackles and no wheezes.    Heart:  normal rate, regular rhythm, no murmur, and no rub. BLE without edema. normal DP  pulses and normal cap refill in all 4 extremities    Abdomen:  obese, soft, non-tender, normal bowel sounds, no distention; no masses and no appreciable hepatomegaly or splenomegaly.   Msk:  No deformity or scoliosis noted of thoracic or lumbar spine.   Neurologic:  alert & oriented X3 and cranial nerves II-XII symetrically intact.  strength normal in all extremities, sensation intact to light touch, and gait normal. speech fluent without dysarthria or aphasia; follows commands with good comprehension.  Skin:  no rashes, vesicles, ulcers, or erythema. No nodules or irregularity to palpation.  Cervical Nodes:  No lymphadenopathy noted Axillary Nodes:  No palpable lymphadenopathy Psych:  Oriented X3, memory intact for recent and remote,  normally interactive, good eye contact, not anxious appearing, not depressed appearing, and not agitated.      Impression & Recommendations:  Problem # 1:  ESSENTIAL HYPERTENSION, BENIGN (ICD-401.1)  Her updated medication list for this problem includes:    Furosemide 20 Mg Tabs (Furosemide) .[name]... 1 by mouth once daily    Enalapril Maleate 20 Mg Tabs (Enalapril maleate) .[name]... 1 by mouth two times a day    Amlodipine Besylate 5 Mg Tabs (Amlodipine besylate) ..... One by mouth daily    Carvedilol 25 Mg Tabs (Carvedilol) .[name]... Take 1 tablet by mouth twice a day    Carvedilol 6.25 Mg Tabs (Carvedilol) .[name]... Take 1 tablet by mouth twice a day  BP today: 142/92 Prior BP: 158/108 (01/16/2009)  Labs Reviewed: K+: 4.2 (05/09/2008) Creat: : 0.72 (05/09/2008)   Chol: 237 (05/09/2008)   HDL: 62 (05/09/2008)   LDL: 148 (05/09/2008)   TG: 136 (05/09/2008)  Problem # 2:  OBESITY (ICD-278.00)  We had a discussion about losing weight with diet and exercise.  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  She will continue on the meds as listed.  Problem # 4:  ADHD (ICD-314.01)  Her psychiatric problems or her major issue. I have encouraged close followup with mental health.  Problem # 5:  DEPRESSION (ICD-311) cont to follow at [facility] for same  The following medications were removed from the medication list:    Clonazepam 0.125 Mg Tbdp (Clonazepam) .[name]... 1 by mouth as needed Her updated medication list for this problem includes:    Clonazepam 0.5 Mg Tabs (Clonazepam) .[name]... Take 1 two times a day  Problem # 6:  MIGRAINE HEADACHE (ICD-346.90)  Her updated medication list for this problem includes:    Carvedilol 25 Mg Tabs (Carvedilol) .[name]... Take 1 tablet by mouth twice a day    Carvedilol 6.25 Mg Tabs (Carvedilol) .[name]... Take 1 tablet by mouth twice a day    Ibuprofen 800 Mg Tabs (Ibuprofen) .[name]... 1 by mouth every 8 hours as needed for back pain  Problem # 7:  CARPAL TUNNEL SYNDROME, RIGHT (ICD-354.0) advised use of cockup wrist splint and B6 100u/d - consider NCS or hand eval if symptoms persisit or worsen Labs Reviewed: TSH: 3.656 (04/14/2008)     Problem # 8:  other  Time spent with patient 45 minutes, more than 50% of this time was spent counseling patient on depression and ADHD, weight control, reviewing her meds and medical hx; also education and recs for conserv tx of CTS to hopefully avoid surgery  Complete Medication List: 1)  Furosemide 20 Mg Tabs (Furosemide) .[name].. 1 by mouth once daily 2)  Klor-con M10 10 Meq Cr-tabs (Potassium chloride crys cr) .[name].. 1 by mouth daily 3)  Enalapril Maleate 20 Mg Tabs (Enalapril maleate) .[name].. 1 by mouth two times a day 4)  Amlodipine Besylate 5 Mg Tabs (Amlodipine besylate) .... One by mouth daily 5)  Carvedilol 25 Mg Tabs (Carvedilol) .... Take 1 tablet by mouth twice a day 6)  Carvedilol 6.25 Mg Tabs (Carvedilol) .... Take 1 tablet by mouth twice a day 7)  Lamictal 200 Mg Tabs (Lamotrigine) .... Take 1 two times a day 8)  Adderall  Tabs (Amphetamine-dextroamphetamine tabs) .... Current dosing and medication managed by mental health 9)  Clonazepam 0.5 Mg Tabs (Clonazepam) .Marland KitchenMarland KitchenMarland Kitchen  Take 1 two times a day 10)  Womens Multivitamin Plus Tabs (Multiple vitamins-minerals) .[name].. 1 by mouth once daily 11)  Ibuprofen 800 Mg Tabs (Ibuprofen) .[name].. 1 by mouth every 8 hours as needed for back pain  Patient Instructions: 1)  it was good to see you today.  2)  no medication changes recommended today - continue as currently prescribed 3)  it is important that you work on losing weight - monitor your diet and consume fewer calories such as less carbohydrates (sugar) and less fat. you also need to increase your physical activity level - start by walking for 10-20 minutes 3 times per week and work up to 30 minutes 4-5 times each week.  4)  wear cock up wrist splint at bedtime and as needed and take Vit B6 100 units/day for your carpal tunnel symptoms - let us know if this becomes worse 5)  Please schedule a follow-up appointment in 6 months, sooner if problems.      Pap Smear  Procedure date:  10/26/2008  Findings:      Interpretation/Result:Negative for intraepithelial Lesion or Malignancy.     Mammogram  Procedure date:  10/26/2008  Findings:      No specific mammographic evidence of malignancy.      Orders Added: 1)  New Patient Level IV [62130]

## 2010-03-27 NOTE — Progress Notes (Signed)
Summary: faxed to Manuela Neptune  Phone Note From Other Clinic   Summary of Call: I received a phone call from Manuela Neptune [facility], [address]  [place], [place]) concerning this patient.  The patient will need further (increased) pharmacologic treatment of her ADD.  She has had no recent cardiac symptoms and her EF is much improved compared with previous.  Based on this and per [facility] suggestions that patient has no obvious increased risk for the meds used to treat this condition.  I will follow her going forward. Initial call taken by: [name], MD, [facility],  April 26, 2009 4:45 PM

## 2010-03-27 NOTE — Progress Notes (Signed)
Summary: RETAIN FLUIDS/ B/P ISSUES  Phone Note Call from Patient Call back at Home Phone [phone] Call back at [phone]- CELL   Caller: Patient Reason for Call: Talk to Nurse Summary of Call: NOT FEELLING WELL, RETAIN FLUID X 3 DAYS .  UP/DOWN B/P WAS NOT TAKEN TODAY.  Initial call taken by: Lorne Skeens,  September 28, 2008 12:25 PM  Follow-up for Phone Call        taking meds and furosemide 20mg  in am as directed, bilat LE edema L>R, feet feel, no sob, going on for about 3-4days, explained ef had returned to normal, pt reports no change in diet regarding salt and no extra fluid intake, will take extra lasix today and c/b if not better Meredith Staggers, RN  September 28, 2008 1:52 PM

## 2010-03-27 NOTE — Assessment & Plan Note (Signed)
Summary: rov   Visit Type:  Follow-up Primary Provider:  Newt Lukes MD  CC:  HTN and Cardiomyopathy.  History of Present Illness: [name] presents for followup of her cardiomyopathy and hypertension. As usual her complaints are noncardiac. She has significant problems with ADHD and weight and depression. She's had problems with consistent medical followup for this. This is unfortunate because at one point in time she had made significant strides in her mood and weight loss and has since slid backward. Despite this she doesn't report any shortness of breath with usual activities. She's not having any PND or orthopnea. She's not having any chest pressure, neck or arm discomfort. She is having no new palpitations.  Current Medications (verified): 1)  Furosemide 20 Mg Tabs (Furosemide) .[name].. 1 By Mouth Once Daily 2)  Klor-Con M10 10 Meq Cr-Tabs (Potassium Chloride Crys Cr) .[name].. 1 By Mouth Daily 3)  Enalapril Maleate 20 Mg Tabs (Enalapril Maleate) .[name].. 1 By Mouth Two Times A Day 4)  Amlodipine Besylate 5 Mg Tabs (Amlodipine Besylate) .... Stopped 5)  Coreg 6.25 Mg Tabs (Carvedilol) .... One Tablet Twice A Day 6)  Lamictal 200 Mg Tabs (Lamotrigine) .... Take 1 Two Times A Day 7)  Adderall  Tabs (Amphetamine-Dextroamphetamine Tabs) .... Current Dosing and Medication Managed By Mental Health 8)  Clonazepam 0.5 Mg Tabs (Clonazepam) .... Take 1 Two Times A Day As Needed Q 9)  Womens Multivitamin Plus  Tabs (Multiple Vitamins-Minerals) .[name].. 1 By Mouth Once Daily 10)  Ibuprofen 800 Mg Tabs (Ibuprofen) .[name].. 1 By Mouth Every 8 Hours As Needed For Back Pain 11)  Coreg 25 Mg Tabs (Carvedilol) .... One Twice A Day  Allergies (verified): 1)  ! Penicillin 2)  ! * Cymbalta  Past History:  Past Medical History: Cardiomyopathy (presumed postpartum previously, EF now 60%) obesity hypertension  PVCs depression/anxiety ADHD  MD roster: [facility]  Past  Surgical History: Reviewed history from 04/05/2009 and no changes required. Gastric bypass Child birth (2003) Cardiomyopathy (2003)  Review of Systems       As stated in the HPI and negative for all other systems.   Vital Signs:  Patient profile:   45 year old female Height:      64 inches Weight:      299 pounds BMI:     51.51 Pulse rate:   102 / minute Resp:     18 per minute BP sitting:   142 / 94  (right arm)  Vitals Entered By: Marrion Coy, CNA (August 23, 2009 10:37 AM)  Physical Exam  General:  Well developed, well nourished, in no acute distress. Head:  normocephalic and atraumatic Neck:  Neck supple, no JVD. No masses, thyromegaly or abnormal cervical nodes. Chest Wall:  no deformities or breast masses noted Lungs:  Clear bilaterally to auscultation and percussion. Heart:  Non-displaced PMI, chest non-tender; regular rate and rhythm, S1, S2 without murmurs, rubs or gallops. Carotid upstroke normal, no bruit. Normal abdominal aortic size, no bruits. Femorals normal pulses, no bruits. Pedals normal pulses. No edema, no varicosities. Abdomen:  Bowel sounds positive; abdomen soft and non-tender without masses, organomegaly, or hernias noted. No hepatosplenomegaly. Msk:  Back normal, normal gait. Muscle strength and tone normal. Extremities:  No clubbing or cyanosis. Neurologic:  Alert and oriented x 3. Skin:  Intact without lesions or rashes. Cervical Nodes:  no significant adenopathy Psych:  anxious.     EKG  Procedure date:  08/23/2009  Findings:  catheter sinus rhythm, rate 102, rightward axis, QTC prolonged, no acute ST-T wave changes  Impression & Recommendations:  Problem # 1:  ESSENTIAL HYPERTENSION, BENIGN (ICD-401.1) Her blood pressure is slightly elevated. She needs weight loss. I will increase her carvedilol as well. 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 She is having no new symptoms. No further testing is suggested.  Problem # 3:  ABNORMAL ELECTROCARDIOGRAM (ICD-794.31) She does have a mildly prolonged QTC and needs to avoid further QT prolonging drugs. She has had no symptoms related to this.  Patient Instructions: 1)  Your physician recommends that you schedule a follow-up appointment in: 12 months 2)  Your physician has recommended you make the following change in your medication: Increase coreg to 37.5 mg by mouth two times a day 3)  Ibuprofen 800 mg by mouth daily as needed Prescriptions: IBUPROFEN 800 MG TABS (IBUPROFEN) take one tablet by mouth daily as needed  #90 x 0   Entered by:   Dossie Arbour, RN, BSN   Authorized by:   [name], MD, [facility]   Signed by:   Dossie Arbour, RN, BSN on 08/23/2009   Method used:   Electronically to        [id number] (retail)       [address]       [place], [place]  [address]       Ph: [phone] or [phone]       Fax: [phone]   RxID:   [id number] COREG 12.5 MG TABS (CARVEDILOL) take one tablet by mouth daily  #180 x 3   Entered by:   Dossie Arbour, RN, BSN   Authorized by:   [name], MD, [facility]   Signed by:   Dossie Arbour, RN, BSN on 08/23/2009   Method used:   Electronically to        [id number] (retail)       [address]       [place], [place]  [address]       Ph: [phone] or [phone]       Fax: [phone]   RxID:   [id number]  The above prescription was reviewed before the patient left the office.  It was noted to be sent in for once daily but is to be twice daily.  This was corrected in the pharmacy and with the patient. [name], MD, [facility]  August 23, 2009 2:32 PM

## 2010-03-27 NOTE — Progress Notes (Signed)
  Medications Added CARVEDILOL 25 MG TABS (CARVEDILOL) Take 1 tablet by mouth twice a day CARVEDILOL 6.25 MG TABS (CARVEDILOL) Take 1 tablet by mouth twice a day KLOR-CON M10 10 MEQ CR-TABS (POTASSIUM CHLORIDE CRYS CR)        Phone Note Refill Request Call back at [phone] Message from:  Patient on June 08, 2008 8:25 AM  Refills Requested: Medication #1:  COREG 25 MG TABS 1tpobid per pt call the insurance still will not approve for non generic med and she is allergic. What else cah she do  Initial call taken by: Omer Jack,  June 08, 2008 8:26 AM  Follow-up for Phone Call        Rx refill handled  see Rx refill request Omer Jack  Jul 05, 2008 12:59 PM     New/Updated Medications: CARVEDILOL 25 MG TABS (CARVEDILOL) Take 1 tablet by mouth twice a day CARVEDILOL 6.25 MG TABS (CARVEDILOL) Take 1 tablet by mouth twice a day KLOR-CON M10 10 MEQ CR-TABS (POTASSIUM CHLORIDE CRYS CR)

## 2010-03-27 NOTE — Progress Notes (Signed)
Summary: calling regarding some meds  Phone Note Call from Patient Call back at Home Phone [phone]   Caller: Patient Summary of Call: returning call about some meds Initial call taken by: Judie Grieve,  October 10, 2008 10:58 AM  Follow-up for Phone Call        talked with patient-pt requesting refill for Coreg 6.25mg  twice daily- pt states she takes a Coreg 6.25 and 25mg  two times a day  I will refill this for her and have made her an appt with Dr [name] for 12/15/08-I asked her to bring all of her medication with her at the time of her OV with Dr [name]     Prescriptions: COREG 6.25 MG TABS (CARVEDILOL) one by mouth bid  #180 x 3   Entered by:   Katina Dung, RN, BSN   Authorized by:   [name], MD, [facility]   Signed by:   Katina Dung, RN, BSN on 10/10/2008   Method used:   Electronically to        [facility]       [address]       [place], [place]  [address]       Ph: [phone] or [phone]       Fax: [phone]   RxID:   [id number]

## 2010-03-27 NOTE — Progress Notes (Signed)
Summary: refill meds  Phone Note Refill Request Call back at Home Phone [phone] Message from:  Patient on July 28, 2009 12:39 PM  Refills Requested: Medication #1:  FUROSEMIDE 20 MG TABS 1 by mouth once daily   Supply Requested: 3 months  Medication #2:  KLOR-CON M10 10 MEQ CR-TABS 1 by mouth daily   Supply Requested: 3 months  Medication #3:  ENALAPRIL MALEATE 20 MG TABS 1 by mouth two times a day   [facility] Requested[facility] [address] [facility][phone]   Method Requested: Fax to Local Pharmacy Initial call taken by: Lorne Skeens,  July 28, 2009 12:40 PM  Follow-up for Phone Call        Spoke wtih pharmacy rx sent in for 90 day supply.  Pt notified. Marrion Coy, CNA  July 28, 2009 1:37 PM   Follow-up by: Marrion Coy, CNA,  July 28, 2009 1:37 PM

## 2010-03-27 NOTE — Progress Notes (Signed)
Summary: needs letter stating she has to have Coreg and not carvedilol  Phone Note Call from Patient Call back at Home Phone [phone]   Caller: Patient Reason for Call: Talk to Nurse Summary of Call: needs letter stating why she needs coreg 25 mg and that she has tried all generic brands, letter needs to have attn appeals department, with fast appeals fax to [phone] [facility] Account # [id number] Initial call taken by: Migdalia Dk,  March 20, 2009 11:59 AM  Follow-up for Phone Call        PT CALLING AGAIN ABOUT PAPER WORK.SIGN Follow-up by: Judie Grieve,  March 22, 2009 2:44 PM  Additional Follow-up for Phone Call Additional follow up Details #1::        OK to write note.  She has been intolerant of carvedilol. Additional Follow-up by: [name], MD, [facility],  March 23, 2009 8:58 AM    Additional Follow-up for Phone Call Additional follow up Details #2::    Letter typed and faxed as requested Follow-up by: Charolotte Capuchin, RN,  March 23, 2009 9:39 AM

## 2010-03-27 NOTE — Progress Notes (Signed)
Summary: triage/headache/HTN  Phone Note Call from Patient   Caller: Patient Reason for Call: Talk to Nurse Summary of Call: Patient called stating that she has had severe headaches since 10/23.Marland KitchenMarland Kitchenpatient states the headaches are in the "top, back and just all over my head". Patient denies worse headache ever head, but says it is really bad.Marland KitchenMarland KitchenPatient has a history of migraine headaches.Marland KitchenMarland KitchenShe has been taking Ibuprofen 800mg  for it and it eases it a little but comes right back.Marland KitchenMarland KitchenShe has also been taking claritin.Marland KitchenMarland KitchenPatient denies nausea...usually when its a migraine she has nausea....patient took Blood Pressure while we were on the phone and it was 190/124  pulse 99.Marland KitchenMarland KitchenAdvised patient to have husband take her to ED (we do not have any acute visits left today)...Marland KitchenMarland KitchenHusband was there and she said he would take her.... Initial call taken by: Conchita Paris,  December 27, 2008 11:21 AM

## 2010-03-27 NOTE — Progress Notes (Signed)
Summary: Coreg  Phone Note Outgoing Call   Summary of Call: Prescription originally sent to Pharmacy was for Coreg 12. 5 mg by mouth daily. It should be for Coreg 12. 5 mg by mouth two times a day. Pharmacy called and order changed.  I called pt to let her know to update med list.  Pt verbally understood to take 37. 5 mg Coreg by mouth two times a day while here for office visit today. Left message to call back.  Initial call taken by: Dossie Arbour, RN, BSN,  August 23, 2009 12:39 PM  Follow-up for Phone Call        Spoke with pt and gave her above information. She has not picked prescription up yet and I assured her that prescription at pharmacy has correct directions.  She verbalizes understanding of how to take coreg. Follow-up by: Dossie Arbour, RN, BSN,  August 24, 2009 5:49 PM

## 2010-03-27 NOTE — Assessment & Plan Note (Signed)
Summary: f60m   Visit Type:  Follow-up Primary Provider:  Donia Guiles, MD  CC:  Cardiomyopathy.  History of Present Illness: The patient presents for followup of her hypertension and previous cardiomyopathy. Since the last visit she has had problems with headaches. She has had at least one episode of a severe headache with a blood pressure recorded at 190/107. She was seen in the emergency room. She says that she was treated for the headache. She had no change to her antihypertensives. She saw a primary provider and has not changed. She is unhappy with the level of service from her primary clinic. She does take her blood pressure at home and it's been running in the 150s systolic above 100 diastolic. She had one episode of lower extremity swelling several weeks ago that persisted for a few days.  She's not been particularly active. She's had a steady weight gain. However, she denies any shortness of breath, PND or orthopnea. She's had no palpitations, presyncope or syncope. She has no chest pain.  Current Medications (verified): 1)  Furosemide 20 Mg Tabs (Furosemide) .[name].. 1tpoqam 2)  Lamictal  Tabs (Lamotrigine Tabs) .... Current Dosing and Medication Managed By Mental Health 3)  Adderall  Tabs (Amphetamine-Dextroamphetamine Tabs) .... Current Dosing and Medication Managed By Mental Health 4)  Enalapril Maleate 20 Mg Tabs (Enalapril Maleate) .[name].. 1tpobid 5)  Ibuprofen 800 Mg Tabs (Ibuprofen) .[name].. 1tpoq8hprn For Back Pain 6)  Carvedilol 25 Mg Tabs (Carvedilol) .... Take 1 Tablet By Mouth Twice A Day 7)  Carvedilol 6.25 Mg Tabs (Carvedilol) .... Take 1 Tablet By Mouth Twice A Day 8)  Klor-Con M10 10 Meq Cr-Tabs (Potassium Chloride Crys Cr) .[name].. 1 By Mouth Daily 9)  Clonazepam 0.125 Mg Tbdp (Clonazepam) .[name].. 1 By Mouth As Needed  Allergies (verified): 1)  ! Penicillin  Past History:  Past Medical History: Reviewed history from 06/02/2008 and no changes required.  Cardiomyopathy (presumed  postpartum previously, EF now 60%),   obesity, hypertension, PVCs, depression, and attention deficit disorder  Past Surgical History: Reviewed history from 06/02/2008 and no changes required. Gastric bypass  Review of Systems       As stated in the HPI and negative for all other systems.   Vital Signs:  Patient profile:   45 year old female Height:      64 inches Weight:      286 pounds BMI:     49.27 Pulse rate:   92 / minute Resp:     16 per minute BP sitting:   158 / 108  (right arm)  Vitals Entered By: Marrion Coy, CNA (January 16, 2009 8:43 AM)  Physical Exam  General:  Well developed, well nourished, in no acute distress. Head:  normocephalic and atraumatic Eyes:  PERRLA/EOM intact; conjunctiva and lids normal. Mouth:  Teeth, gums and palate normal. Oral mucosa normal. Neck:  Neck supple, no JVD. No masses, thyromegaly or abnormal cervical nodes. Chest Wall:  no deformities or breast masses noted Lungs:  Clear bilaterally to auscultation and percussion. Abdomen:  Bowel sounds positive; abdomen soft and non-tender without masses, organomegaly, or hernias noted. No hepatosplenomegaly. Msk:  Back normal, normal gait. Muscle strength and tone normal. Extremities:  No clubbing or cyanosis. Neurologic:  Alert and oriented x 3. Skin:  Intact without lesions or rashes. Cervical Nodes:  no significant adenopathy Axillary Nodes:  no significant adenopathy Psych:  Normal affect.   Detailed Cardiovascular Exam  Neck    Carotids: Carotids full and equal bilaterally  without bruits.      Neck Veins: Normal, no JVD.    Heart    Inspection: no deformities or lifts noted.      Palpation: normal PMI with no thrills palpable.      Auscultation: regular rate and rhythm, S1, S2 without murmurs, rubs, gallops, or clicks.    Vascular    Abdominal Aorta: no palpable masses, pulsations, or audible bruits.      Femoral Pulses: normal femoral pulses bilaterally.      Pedal  Pulses: normal pedal pulses bilaterally.      Radial Pulses: normal radial pulses bilaterally.      Peripheral Circulation: no clubbing, cyanosis, or edema noted with normal capillary refill.     EKG  Procedure date:  01/16/2009  Findings:      nsinus rhythm, rate 92, left axis deviation, QTC prolonged, no acute ST-T wave changes.  Impression & Recommendations:  Problem # 1:  ESSENTIAL HYPERTENSION, BENIGN (ICD-401.1) Her blood pressure is above target. Therapeutic lifestyle changes are not working. Therefore, I will add Norvasc 5 mg daily. Orders: EKG w/ Interpretation (93000)  Problem # 2:  ABNORMAL ELECTROCARDIOGRAM (ICD-794.31) She does have a mildly prolonged QTC. However, she's had no symptoms related to this. She would need to avoid QT prolonging drugs going forward. We will repeat EKGs in the future.  Problem # 3:  OBESITY (ICD-278.00) We again had a discussion about losing weight with diet and exercise.  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 She will continue on the meds as listed. Orders: EKG w/ Interpretation (93000)  Problem # 5:  EDEMA (ICD-782.3) We discussed p.r.n. dosing of Lasix.  Patient Instructions: 1)  Your physician recommends that you schedule a follow-up appointment in: 6 month with Dr [name] 2)  Your physician has recommended you make the following change in your medication: Norvasc (Amlodipine 5 mg daily) 3)  Your physician recommends a low cholesterol, low fat diet. 4)  Your physician encouraged you to lose weight for better health. Prescriptions: AMLODIPINE BESYLATE 5 MG TABS (AMLODIPINE BESYLATE) one by mouth daily  #90 x 3   Entered by:   Charolotte Capuchin, RN   Authorized by:   [name], MD, [facility]   Signed by:   Charolotte Capuchin, RN on 01/16/2009   Method used:   [facility]ronically to        [facility]       [address]       [place], [place]  [address]       Ph: [phone] or [phone]       Fax: [phone]   RxID:   [id number]

## 2010-03-27 NOTE — Letter (Signed)
Summary: Generic Letter  Architectural technologist, Main Office  [address]   [place], [place] [address]   Phone: [phone]  Fax: [phone]          March 23, 2009 MRN: [id number]    RE: GITTY OSTERLUND [id number] [place], [place]  [address]    To Whom It may Concern:  Ms Mundo is under my care for history of cardiomyopathy.  In the past, I have managed her successfully with brand name carvedilol (Coreg).  She  has had a significant improvement in her ejection fraction with this and other therapies.  When we have tried to switch her to generic carvedilol, she has been intolerant of this medication with severe side effects.  Therefore, I requested exception to the rule for generics and requested this patient please be prescribed Coreg at the requested dose.  If you have any further questions about this please do not hesitate to call me.     Sincerely,     Dr [name]

## 2010-03-27 NOTE — Progress Notes (Signed)
Summary: question regarding coreg/   Phone Note Call from Patient Call back at Home Phone [phone] Call back at [phone]    Caller: Patient Reason for Call: Talk to Nurse Summary of Call: Per pt calling regarding her rx coreg, pls would like a call back today.  Initial call taken by: Lorne Skeens,  April 07, 2009 4:30 PM  Follow-up for Phone Call        spoke with pt, she had recieved carvedilol 25 and 6.25 mg tablets from mail order and need Coreg brand name as she is allergic to carvedilol.  I spoke with [facility] and they d/c the rx and have marked her file that she should only have Coreg.  Pt is aware and new rx has been [facility] Follow-up by: Charolotte Capuchin, RN,  April 07, 2009 5:51 PM    New/Updated Medications: COREG 6.25 MG TABS (CARVEDILOL) one tablet twice a day [BMN] COREG 25 MG TABS (CARVEDILOL) one twice a day [BMN] Prescriptions: COREG 25 MG TABS (CARVEDILOL) one twice a day Brand medically necessary #180 x 3   Entered by:   Charolotte Capuchin, RN   Authorized by:   [name], MD, [facility]   Signed by:   Charolotte Capuchin, RN on 04/07/2009   Method used:   Electronically to        [facility]       [address]       [place], [place]  [address]       Ph: [phone] or [phone]       Fax: [phone]   RxID:   [id number] COREG 6.25 MG TABS (CARVEDILOL) one tablet twice a day Brand medically necessary #180 x 3   Entered by:   Charolotte Capuchin, RN   Authorized by:   [name], MD, [facility]   Signed by:   Charolotte Capuchin, RN on 04/07/2009   Method used:   Electronically to        [facility]       [address]       [place], [place]  [address]       Ph: [phone] or [phone]       Fax: [phone]   RxID:   [id number]

## 2010-03-27 NOTE — Progress Notes (Signed)
Summary: Bleeding  Phone Note Call from Patient   Reason for Call: Talk to Nurse Summary of Call: patient left message...she had just seen Dr. Reche Dixon 9/15 with abd cramping for one week.Marland KitchenMarland KitchenAt that time she didn't have any bleeding..States Dr. Reche Dixon had thought she had a virus...patient states using tampon for bleeding...not as heavy as normal period but more than a little bright red blood.Marland KitchenMarland KitchenI spoke with Dr. Reche Dixon and he thought this probably was a period that may be irregular.Marland KitchenMarland KitchenI called patient back and she was about to [facility] into [facility]Marland KitchenMarland KitchenI informed patient of my conversation with Dr. Kerrin Champagne..however patient feels that since this is not like the periods she has always had there is something else wrong.Marland KitchenMarland KitchenAdvised patient to keep [place] informed.. Initial call taken by: Conchita Paris,  November 11, 2008 9:46 AM

## 2010-03-27 NOTE — Progress Notes (Signed)
Summary: triage call-abdominal cramping * 1 week  Phone Note Call from Patient   Details for Reason: Triage call: request appointment today for abdominal cramps original onset 11-02-08 associated with103 fever, next day felt some better so declined going to ER or MD, 11-04-08 started feeling worse again, improves and then has pain again. Seemed to resolve on or about 11-06-08. States as of yesterday she started having the pain-cramps, again, limiting ability to walk up and down steps secondary to abdominal pain. [phone], [phone]  Describes pain and diffuse, beneath belly button, across abdomen, today pain 3 /10, c/o nausea, denies vomiting, last bm-today, states had some diarrhea yesterday, states now is "pasty" having to wipe multiple times to get clean Vaginal discharge-denies Worsens with ambulation, contact with area and lifting legs to get into bed- improves with IBU Denies urinary burning, frequency or urgency Currently afebrile Denies injury Last pap-2/09-normal with Candida present Last menstrual cycle-states cycles are regular and occurred around July 29th   Acute appointment with Dr. Reche Dixon at 1030. Will need UA, wet prep collected when rooming for providers review.   Initial call taken by: Blondell Reveal RN,  November 09, 2008 9:43 AM

## 2010-05-30 LAB — POCT I-STAT, CHEM 8
BUN: 13 mg/dL (ref 6–23)
Calcium, Ion: 1.19 mmol/L (ref 1.12–1.32)
Chloride: 103 mEq/L (ref 96–112)
Creatinine, Ser: 0.5 mg/dL (ref 0.4–1.2)
Glucose, Bld: 87 mg/dL (ref 70–99)
HCT: 42 % (ref 36.0–46.0)
Hemoglobin: 14.3 g/dL (ref 12.0–15.0)
Potassium: 4 mEq/L (ref 3.5–5.1)
Sodium: 139 mEq/L (ref 135–145)
TCO2: 28 mmol/L (ref 0–100)

## 2010-06-01 LAB — CBC
HCT: 41.8 % (ref 36.0–46.0)
Hemoglobin: 14.1 g/dL (ref 12.0–15.0)
MCHC: 33.8 g/dL (ref 30.0–36.0)
MCV: 89.5 fL (ref 78.0–100.0)
Platelets: 268 10*3/uL (ref 150–400)
RBC: 4.67 MIL/uL (ref 3.87–5.11)
RDW: 13.5 % (ref 11.5–15.5)
WBC: 9.9 10*3/uL (ref 4.0–10.5)

## 2010-06-01 LAB — URINALYSIS, ROUTINE W REFLEX MICROSCOPIC
Bilirubin Urine: NEGATIVE
Glucose, UA: NEGATIVE mg/dL
Ketones, ur: NEGATIVE mg/dL
Leukocytes, UA: NEGATIVE
Nitrite: NEGATIVE
Protein, ur: NEGATIVE mg/dL
Specific Gravity, Urine: 1.02 (ref 1.005–1.030)
Urobilinogen, UA: 0.2 mg/dL (ref 0.0–1.0)
pH: 5.5 (ref 5.0–8.0)

## 2010-06-01 LAB — WET PREP, GENITAL
Clue Cells Wet Prep HPF POC: NONE SEEN
Trich, Wet Prep: NONE SEEN
Yeast Wet Prep HPF POC: NONE SEEN

## 2010-06-01 LAB — GC/CHLAMYDIA PROBE AMP, GENITAL
Chlamydia, DNA Probe: NEGATIVE
GC Probe Amp, Genital: NEGATIVE

## 2010-06-01 LAB — URINE MICROSCOPIC-ADD ON

## 2010-06-01 LAB — POCT PREGNANCY, URINE
Preg Test, Ur: NEGATIVE
Preg Test, Ur: NEGATIVE

## 2010-07-10 NOTE — Assessment & Plan Note (Signed)
[facility]                            CARDIOLOGY OFFICE NOTE   DAO, MEARNS                       MRN:          [id number]  DATE:07/04/2006                            DOB:          27-Apr-1965    PRIMARY:  Dr. Donia Guiles.   REASON FOR PRESENTATION:  Evaluate the patient for dilated  cardiomyopathy.   HISTORY OF PRESENT ILLNESS:  The patient returns for followup.  She is  now 45 years old.  She has done well since I last saw her.  She has  actually lost 45 pounds on a diet.  She states she feels better.  She is  doing heart strides at [facility]  She is not having any chest  discomfort, neck or arm discomfort.  She is not having any palpitations,  pre-syncope, or syncope.  She is having no PND or orthopnea.  She has  emotional stress, which she is actually doing well with this.   PAST MEDICAL HISTORY:  Cardiomyopathy (questionably related to  pregnancy).  Morbid obesity.  Hypertension.  Depression.  Attention  deficit disorder.   ALLERGIES:  PENICILLIN.   CURRENT MEDICATIONS:  1. Multivitamin.  2. Klor-Con 10 mEq daily.  3. Coreg 37.5 mg b.i.d.  4. Lamictal 200 mg b.i.d.  5. Enalapril 20 mg b.i.d.  6. Nuvaring.  7. Furosemide 20 mg daily.  8. Adderall 10 mg b.i.d.   REVIEW OF SYSTEMS:  As stated in the HPI and otherwise negative for  other systems.   PHYSICAL EXAMINATION:  The patient is in no distress.  Blood pressure 145/94 (repeat 138/86), heart rate 88 and regular, weight  238 pounds, body mass index 40.  HEENT:  Eyelids unremarkable.  Pupils equal, round, and reactive to  light.  Fundi not visualized.  Oral mucosa unremarkable.  NECK:  No jugular venous distension at 45 degrees.  Carotid upstroke  brisk and symmetric, no bruits, thyromegaly.  LYMPHATICS:  No cervical, axillary, or inguinal adenopathy.  LUNGS:  Clear to auscultation bilaterally.  BACK:  No costovertebral angle tenderness.  CHEST:   Unremarkable.  HEART:  PMI not displaced or sustained, S1 and S2 within normal limits,  no S3, no S4, no clicks, rubs, murmurs.  ABDOMEN:  Obese, positive bowel sounds, normal in frequency and pitch,  no bruits, rebound, guarding.  No midline pulsatile mass, hepatomegaly,  splenomegaly.  SKIN:  No rashes, no nodules.  EXTREMITIES:  With 2+ pulses throughout, no edema.   EKG:  Sinus rhythm, rate 88, axis within normal limits, intervals within  normal limits.  No acute ST-T wave changes.   ASSESSMENT AND PLAN:  1. Cardiomyopathy.  The patient is doing well with regard to this.      She has had an improvement in her ejection fraction on previous      evaluations.  She has class I symptoms.  At this point, she will      continue the medications as listed.  2. I greatly applaud her weight loss and encourage more of the same.  3. Risk reduction.  She requests a prescription  for omega 3 fatty      acids, and I think this is reasonable.  I have written for this at      [place] 1 g b.i.d.  4. Medications, she asked today why she was not taking an aspirin a      day.  We had a long discussion today in the office about the      various risk reductions (greater than a half hour).  She does not      need aspirin.  5. Followup.  We will see her again in 6 months.     [name], MD, [facility]     JH/MedQ  DD: 07/04/2006  DT: 07/04/2006  Job #: [id number]

## 2010-07-10 NOTE — H&P (Signed)
[name], [name] NO.:  [id number]   MEDICAL RECORD NO.:  [id number]          PATIENT TYPE:  IPS   LOCATION:  0502                          FACILITY:  [facility]   PHYSICIAN:  Geoffery Lyons, M.D.      DATE OF BIRTH:  [birth year]   DATE OF ADMISSION:  10/03/2006  DATE OF DISCHARGE:                       PSYCHIATRIC ADMISSION ASSESSMENT   IDENTIFYING INFORMATION:  This is an involuntary admission to the  services of Dr. Geoffery Lyons.  This is a 45 year old divorcing white  female.  She presented to the ED at [facility] yesterday.  EMS  brought her in.  She had taken an unknown amount of Klonopin  approximately 50 minutes ago.  This was her second suicide attempt.  Her  UDS was positive for benzodiazepines as well as amphetamines.  Her  alcohol level was less than 5.  She is prescribed Klonopin and Adderall.  Apparently, she went to a therapy session yesterday where her husband  indicated that he was ready to not be married anymore and this triggered  this attempt.  They have been having issues for quite some time and this  was also the reason on September 19, 2006 that she also took some amount of  extra Klonopin at that point in time.   PAST PSYCHIATRIC HISTORY:  She is under the care of Ellis Savage, NP, and  Junie Bame, therapist.   SOCIAL HISTORY:  She is very difficult to get information out of.  She  is quite tangential, circumstantial but apparently she does receive  disability.  She is married and she does have a 105-year-old son.   FAMILY HISTORY:  She denies.   ALCOHOL/DRUG HISTORY:  She denies.   PRIMARY CARE PHYSICIAN:  Primary care Alanie Syler is Dr. Leta Jungling __________  from [facility].   MEDICATIONS:  The medication list is Adderall 10 mg b.i.d., Klonopin 0.5  mg b.i.d., Coreg 25 mg b.i.d., enalapril maleate oral 20 mg b.i.d., Klor-  Con 10 mg q.d., Lamictal 200 mg q.a.m. and q.h.s., Lasix 20 mg q.d. and  she has also has a NuvaRing in place.   ALLERGIES:  She stated that CYMBALTA gave her mood swings and she also  has an allergy to PENICILLIN.   POSITIVE PHYSICAL FINDINGS:  She is obese.  She was medically cleared in  the ED.  On admission to our unit, we do not have a height or weight.  She would not cooperate.  Her temperature is 98.6, her blood pressure  121/80, pulse 95, respirations 22.  She is status post a cholecystectomy  and she had pregnancy-induced cardiomyopathy that is quite improved  apparently at this point in time.   LABORATORY DATA:  She no other remarkable lab findings.   MENTAL STATUS EXAM:  She is an obese white female.  She is alert x4.  She is mildly irritable.  She gives history in a disorganized way.  She  is evasive in answering questions about overdose and circumstances that  led to it.  She is not overtly delusional.  There are no signs and  symptoms of psychosis.  Her mood is moderately depressed.  Affect is  flat.  Acknowledges she is in crisis but attributes this to problems and  confusion around providers and treatment.   IMPRESSION:  AXIS I:  Mood disorder not otherwise specified.  She  reports a history for attention-deficit disorder and is prescribed  Adderall.  AXIS II:  Cluster B traits.  AXIS III:  Obesity, cardiomyopathy, hypertension.  AXIS IV:  Problems with primary support group.  AXIS V:   PLAN:  To increase the data and history.  We are going to explore  available supports for her.  Review her current treatment.   ESTIMATED LENGTH OF STAY:  Undetermined at this time.      Mickie Leonarda Salon, P.A.-C.      Geoffery Lyons, M.D.  Electronically Signed    MD/MEDQ  D:  10/04/2006  T:  10/04/2006  Job:  [id number]

## 2010-07-10 NOTE — Assessment & Plan Note (Signed)
[facility] HEALTHCARE                            CARDIOLOGY OFFICE NOTE   [name], [name]                       MRN:          [id number]  DATE:07/15/2006                            DOB:          14-Jul-1965    PRIMARY CARE PHYSICIAN:  Dr. Donia Guiles.   REASON FOR PRESENTATION:  Evaluate patient with lightheadedness.   HISTORY OF PRESENT ILLNESS:  The patient called on Friday because she  felt lightheaded. She was standing in line at the post office. She felt  lightheaded but did not have to sit down. She did not lose  consciousness. She felt poorly over the weekend. She continued to have  some dizziness particularly with standing. She had some symptoms  consistent with mild orthostasis. She did record blood pressures that  have been lower than previous. In fact she was in the 90s over 70s at 1  point. She did have some discomfort under her left breast. This was  sharp and fleeting. She has had no substernal chest pressure, neck, or  arm discomfort. She has had no palpitations. She has had no new  shortness of breath, denies any PND, or orthopnea. She just feels  fatigued. She has had no fevers or chills. She has had no diarrhea or  constipation.   The patient did feel dizzy and had these symptoms. She was seen at [facility] in [place] and actually had reasonable blood pressure with no  arrhythmias noted.   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 She previously had an ejection fraction of 30% to 40%.  However, her most recent echo demonstrated her ejection fraction to be  approximately 50%), obesity (the patient has done a beautiful job losing  weight and continues to be on a diet), hypertension, depression,  attention deficit disorder.   ALLERGIES:  PENICILLIN.   MEDICATIONS:  1. Multivitamin.  2. Klor-Con 10 mEq daily.  3. Coreg 37.5 mg b.i.d.  4. Lamictal.  5. Enalapril 20 mg b.i.d.  6. Nuvaring.  7. Furosemide 20 mg daily.  8. Adderall 10 mg b.i.d.   REVIEW OF SYSTEMS:  As stated in the HPI and otherwise negative for  other systems.   PHYSICAL EXAMINATION:  The patient is in no distress. Blood pressure  123/83, heart rate 76 and regular, weight 235 pounds, body mass index  40. Blood pressure with slight orthostatic drop (123/83 down to 108/72  at 5 minutes of standing).  HEENT: __________ Unremarkable. Pupils equal, round, and reactive to  light. Fundi not Visualized. Oral mucosa unremarkable.  NECK: No jugular venous distension, wave form within normal limits.  Carotid upstrokes bruits and symmetric. No bruits. No thyromegaly.  LYMPHATICS: No cervical, axillary, inguinal adenopathy.  LUNGS: Clear to auscultation bilaterally.  BACK: No costovertebral angle tenderness.  CHEST: Unremarkable.  HEART: PMI not displaced or sustained. S1, and S2 within normal limits.  No S3. No S4, no clicks, rubs, no murmurs.  ABDOMEN: Obese, positive bowel sounds normal in frequency and pitch. No  bruits. No rebound. No guarding. No midline pulsatile mass. No  hepatomegaly. No  splenomegaly.  SKIN: No rashes. No nodules.  EXTREMITIES: 2 + pulses throughout. No edema, no cyanosis, or clubbing.  NEURO: Oriented to person, place, and time. Cranial nerves II-XII  grossly intact, motor grossly intact throughout.   EKG: Sinus rhythm, rate 76, left axis deviation, no acute ST-T wave  changes.   ASSESSMENT/PLAN:  1. Dizziness, the patient's dizziness is of unclear etiology. She does      have some orthostatic blood pressure drop and has had some low      blood pressures. Therefore, I am going to have her hold her      diuretic for 2 day. I am going reduce her Coreg to 25 mg b.i.d. She      will then let us know how she feels. She had recent labs that      included  a chemistry which were fine. If by the end of the week      she is not feeling better, we will see her back and make further       adjustments. She knows to come to the emergency room should she      have any increasing symptoms, pre syncope, or syncope.  2. Cardiomyopathy, I will make medication changes as above.  3. Follow up,  I will see her back in a few  months if she does      better.     [name], MD, [facility]  Electronically Signed    JH/MedQ  DD: 07/15/2006  DT: 07/15/2006  Job #: [id number]

## 2010-07-10 NOTE — Assessment & Plan Note (Signed)
[facility] HEALTHCARE                            CARDIOLOGY OFFICE NOTE   ANGLES, TREVIZO                       MRN:          [id number]  DATE:05/18/2007                            DOB:          [birth year]    PRIMARY:  Dineen Kid. Reche Dixon, M.D.   REASON FOR PRESENTATION:  Patient with tachycardia.   HISTORY OF PRESENT ILLNESS:  The patient is 45 years old.  She has a  history of a cardiomyopathy felt to be postpartum.  Her last ejection  fraction; however, last fall was 60%.  She is undergoing quite a bit of  emotional stress.  She has not been exercising, trying to deal with  this.  She got back to the gym recently.  With a minimal a level of  activity; her heart rate, she said was racing and skipping.  She said  that people at the gym did record her heart rate, and told her that she  could not exercise, again, until she had doctors excuse.  She said that  she was only able to go a couple minutes on the bicycle before getting  fatigued.  This is unusual for her.  Again, she does report that she has  not been exercising.  She says at night, her heart is beating fast.  But, again, she admits to being under incredible emotional stress more  than her baseline.  She is not describing PND or orthopnea.  She is not  describing presyncope or syncope.  She has not been having any chest  discomfort.  She was tearful in the office today.   PAST MEDICAL HISTORY:  1. Cardiomyopathy (see above).  2. Obesity.  3. Hypertension.  4. Depression.  5. Attention deficit disorder.   ALLERGIES:  PENICILLIN and intolerance to generic carvedilol.   MEDICATIONS:  1. Multivitamin.  2. Klor-Con.  3. Lamictal 200 mg b.i.d.  4. Enalapril 20 mg b.i.d.  5. Furosemide 20 mg daily.  6. Adderall.  7. Coreg 25 mg b.i.d.   REVIEW OF SYSTEMS:  As stated in the HPI and otherwise negative for  other systems.   PHYSICAL EXAMINATION:  The patient is in no distress.  Her blood  pressure 149/98, heart rate 91 and regular.  HEENT:  Eyes are unremarkable, pupils equal, round, react to light,  fundi not visualized.  Oral mucosa normal.  NECK:  No jugular distention at 45 degrees.  Carotid upstroke brisk and  symmetric.  No bruits or thyromegaly.  LYMPHATICS:  No cervical, axillary, or inguinal adenopathy.  LUNGS:  Clear to auscultation bilaterally.  BACK:  No costovertebral tenderness.  CHEST:  Unremarkable.  HEART:  PMI not displaced or sustained, S1-S2 within normal limits, no  S3-S4, no clicks, no rubs, no murmurs.  ABDOMEN:  Obese, positive bowel sounds normal in frequency and pitch, no  bruits, no rebound, no guarding or midline pulsatile mass.  No  hepatomegaly, no splenomegaly.  SKIN:  No rashes, no nodules.  EXTREMITIES:  2+ pulses throughout, no edema, no cyanosis, no clubbing.  NEURO:  Oriented to person, place, time.  Cranial nerves  II-XII grossly  intact.  Motor grossly intact.   EKG:  Sinus rhythm, rate 90, axis within normal limits, intervals within  normal limits, no acute ST-wave changes.   ASSESSMENT AND PLAN:  1. Palpitations.  I spent greater than 1/2 hour talking to this      patient about all of her social situation.  She is under quite a      bit of stress and it is very difficult.  I think that this is the      predominant reason for her tachy palpitations.  However, I have to      be sure that she did not have any other dysrhythmia; and so I would      like her to wear a 24-hour Holter monitor and go back to the gym.      We can see exactly what is happening with the level of exertion      which she will be trying to achieve for her exercise regimen.  We      are also going to check a TSH as I do not see one recently.  2. Cardiomyopathy.  This is resolved.  However, she will continue the      medications as listed.  3. Anxiety.  Again, we talked about issues for quite a awhile.  She      does have a therapist and needs to continue to  work through these      issues with him.  4. Followup.  Will see back in about a month or sooner if needed.     [name], MD, [facility]  Electronically Signed    JH/MedQ  DD: 05/18/2007  DT: 05/18/2007  Job #: [id number]   cc:   Dineen Kid. Reche Dixon, M.D.

## 2010-07-10 NOTE — Assessment & Plan Note (Signed)
[facility] HEALTHCARE                            CARDIOLOGY OFFICE NOTE   [name], [name]                       MRN:          [id number]  DATE:06/23/2007                            DOB:          1965-10-04    PRIMARY CARE PHYSICIAN:  Dineen Kid. Reche Dixon, M.D.   REASON FOR PRESENTATION:  Evaluate the patient with palpitations.   HISTORY OF PRESENT ILLNESS:  The patient is a pleasant 45 year old with  a history of postpartum cardiomyopathy, though her ejection fraction has  improved.  The last visit, she was having palpitations.  An event  monitor demonstrated PVCs.  She was having these about 1/60-70 beats.  She noticed these.  She was under quite a bit of stress  She is under a  little less stress now.  She notices palpations more when she is  resting.  She has not had any presyncope or syncope.  She is not having  any new chest discomfort.  She is not having any new shortness of breath  and denies PND or orthopnea.   PAST MEDICAL HISTORY:  1. Previous cardiomyopathy (presumed postpartum).  2. Obesity.  3. Hypertension.  4. PVCs.  5. Depression.  6. Attention deficit disorder.   ALLERGIES:  PENICILLIN, AND INTOLERANCES TO GENERIC CARVEDILOL.   MEDICATIONS:  1. Multivitamin.  2. Klor-Con.  3. Lamictal.  4. Enalapril 20 mg b.i.d.  5. Furosemide 20 mg daily.  6. Adderall 10 mg b.i.d.  7. Coreg 25 mg b.i.d.   REVIEW OF SYSTEMS:  As stated in the HPI, and otherwise negative for  other systems.   PHYSICAL EXAMINATION:  GENERAL:  The patient is in no distress.  VITAL SIGNS:  Blood pressure 133/88, heart rate 96 and regular, weight  235 pounds.  HEENT:  Eyes unremarkable.  Pupils equal, round and reactive to light.  Fundi not visualized.  Oral mucosa unremarkable.  NECK:  No JVD at 45 degrees.  Carotid upstroke brisk and symmetric.  No  bruits or thyromegaly.  LYMPHATICS:  No adenopathy.  LUNGS:  Clear to auscultation bilaterally.  BACK:  No  costovertebral tenderness.  CHEST:  Unremarkable.  HEART:  PMI not displaced or sustained.  S1, S2 within normal limits.  No S3, S4, clicks, rubs, murmurs.  ABDOMEN:  Obese, positive bowel sounds.  Normal in frequency and pitch.  No bruits, rebound, guarding.  No midline pulsatile mass.  No  hepatomegaly.  No splenomegaly.  SKIN:  No rashes, no nodules.  EXTREMITIES:  Pulses 2+ without edema.   ASSESSMENT/PLAN:  1. PVCs.  The patient is having frequent PVCs.  I do not think this is      related to her Adderall.  At this point, I am going to try to      increase her Carvedilol.  We tried to give her 6.25 b.i.d. in      addition to her 57.  However, she only takes brand and could not      get this.  I have written prescriptions for Coreg 25 mg plus 6.25      mg b.i.d.  Hopefully, she can get this filled.  We will check a B-      met and a magnesium today.  2. Cardiomyopathy.  Her ejection fraction is back to normal.  She will      be managed with medications as listed.  3. Depression.  I have encouraged continued follow up with Dr. Reche Dixon.   FOLLOWUP:  I will see her back in about four months or sooner if needed.     Samantha Rotunda, MD, [facility]  Electronically Signed    JH/MedQ  DD: 06/23/2007  DT: 06/23/2007  Job #: [id number]   cc:   Dineen Kid. Reche Dixon, M.D.

## 2010-07-10 NOTE — Assessment & Plan Note (Signed)
[facility] HEALTHCARE                            CARDIOLOGY OFFICE NOTE   DANYA, SPEARMAN                       MRN:          [id number]  DATE:12/04/2007                            DOB:          1965-07-09    PRIMARY CARE PHYSICIAN:  Dineen Kid. Reche Dixon, MD.   REASON FOR PRESENTATION:  Evaluate the patient with palpitations.   HISTORY OF PRESENT ILLNESS:  The patient is a 45 year old white female  with history palpitations.  She has had a long history of stress and  anxiety.  She has had a cardiomyopathy, but her ejection fraction in the  last evaluation was 65%.  She has had documented PVCs.  She returns for  followup.  As she usual states she had a lot of emotional stress.  Her  husband who had been gone for months has now back.  She dealing with  this.  She has some gained weight from not exercising and eating too  much.  She has been essentially a single mom racing her young son.  She  denies any new chest discomfort, neck or arm discomfort.  She has not  had any new palpitations, though she will occasionally feel these.  She  has not had any presyncope or syncope.  She has not had any new  shortness of breath.  Denies any PND or orthopnea.   PAST MEDICAL HISTORY:  Cardiomyopathy (presumed postpartum previously),  obesity, hypertension, PVCs, depression, and attention deficit disorder.   ALLERGIES:  PENICILLIN.  She is also intolerant to GENERIC CARVEDILOL.   MEDICATIONS:  1. Multivitamin.  2. Klor-Con.  3. Lamictal.  4. Enalapril 20 mg b.i.d.  5. Furosemide 20 mg daily.  6. Adderall 10 mg b.i.d.  7. Coreg 31.25 mg b.i.d.   REVIEW OF SYSTEMS:  As stated in the HPI and otherwise negative for  other systems.   PHYSICAL EXAMINATION:  GENERAL:  The patient is in no distress.  She is  pleasant.  VITAL SIGNS:  Blood pressure 144/96, heart rate 79 and regular, weight  249 pounds, and body mass index 41.  HEENT:  Eyelids are unremarkable, pupils are  equal, round and reactive  to light, fundi not visualized, oral mucosa normal.  NECK:  No jugular venous distention at 45 degrees, carotid upstroke  brisk and symmetric, no bruits, no thyromegaly.  LYMPHATICS:  No cervical, axillary, or inguinal adenopathy.  LUNGS:  Clear to auscultation bilaterally.  BACK:  No costovertebral angle tenderness.  CHEST:  Unremarkable.  HEART:  PMI not displaced or sustained, S1 and S2 within normal limits.  No S3, no S4, no clicks, on rubs, no murmurs.  ABDOMEN:  Obese, positive bowel sounds normal in frequency and pitch, no  bruits, no rebound, no guarding or midline pulsatile mass, no  hepatomegaly, no splenomegaly.  SKIN:  No rashes, no nodule.  EXTREMITIES:  2+ pulses, no edema.   ASSESSMENT AND PLAN:  1. Palpitations.  The patient is no longer petechiae bothered by      these.  No further cardiac workup is suggested.  2. Cardiomyopathy.  Her ejection fraction  has improved overtime.  She      remained on the medicines as listed.  3. Attention deficit disorder.  I did suggest that it would be safe      for her to be on a higher dose of Adderall per psychiatrist thought      this was necessary.  She has a normal ejection fraction now and no      abnormalities on her EKG.  4. Followup.  I will see her back in 6 months or sooner if needed.     [name], MD, [facility]  Electronically Signed    JH/MedQ  DD: 12/04/2007  DT: 12/05/2007  Job #: [id number]   cc:   Dineen Kid. Reche Dixon, M.D.

## 2010-07-10 NOTE — Letter (Signed)
May 24, 2008     RE:  KALYNN, DECLERCQ  MRN:  [id number]  /  DOB:  Nov 03, 1965   To whom it may concern:   Ms. Blakney is under my care for history of cardiomyopathy.  In the past,  I have managed her successfully with brand name carvedilol (Coreg).  She  has had a significant improvement in her ejection fraction with this and  other therapies.  When we have tried to switch her to generic  carvedilol, she has been intolerant of this medications with severe side  effects.  Therefore, I requested exception to the rule for generics and  requested this patient please be prescribed Coreg at the requested dose.  If you have any further questions about this please do not hesitate to  call me.    Sincerely,      [name], MD, [facility]  Electronically Signed    JH/MedQ  DD: 05/24/2008  DT: 05/25/2008  Job #: [id number]

## 2010-07-10 NOTE — Assessment & Plan Note (Signed)
[facility] HEALTHCARE                            CARDIOLOGY OFFICE NOTE   [name], [name]                       MRN:          [id number]  DATE:01/05/2007                            DOB:          03-18-1965    PRIMARY CARE PHYSICIAN:  [name], M.D.   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   HISTORY OF PRESENT ILLNESS:  Patient is 45 years old.  She presents for  followup of cardiomyopathy.  Since I last saw her, she has had  significant social stress.  She has been at [facility] with suicidal  gesture.  She is going through a divorce.  She is quite depressed and  anxious.  She is getting help at mental health.  She is still working  out a couple of times per week.  She is very fatigued, but she admits  that she is sleeping very poorly.  She is not getting any chest  pressure, neck or arm discomfort.  She is not getting any palpitations,  presyncope, or syncope.  She denies any PND or orthopnea.   PAST MEDICAL HISTORY:  1. Cardiomyopathy (questionably related to pregnancy).  She previously      had an ejection fraction of 30-40%.  However, her most recent echo      demonstrated her ejection fraction to be approximately 50%.  2. Obesity.  3. Hypertension.  4. Depression.  5. Attention-deficit disorder.   ALLERGIES:  PENICILLIN.  An intolerance of GENERIC CARVEDILOL.   MEDICATIONS:  1. Multivitamins.  2. Klor-Con.  3. Lamictal.  4. Enalapril 20 mg b.i.d.  5. Furosemide 20 mg daily.  6. Adderall.  7. Coreg 25 mg b.i.d.   REVIEW OF SYSTEMS:  As stated in the HPI, otherwise negative for other  systems.   PHYSICAL EXAMINATION:  Patient is in no distress.  Blood pressure 144/92, heart rate 85 and regular.  HEENT:  Eyelids unremarkable.  Pupils are equal, round and reactive to  light.  Fundi not visualized.  Oral mucosa unremarkable.  NECK:  No jugular venous distention at 45 degrees.  Carotid upstroke  brisk  and symmetric.  No bruits, no thyromegaly.  LYMPHATICS:  No cervical, axillary, or inguinal adenopathy.  LUNGS:  Clear to auscultation bilaterally.  BACK:  No costovertebral angle tenderness.  CHEST:  Unremarkable.  HEART:  PMI not displaced or sustained.  S1 and S2 within normal limits.  No S3, no S4, no clicks, rubs, or murmurs.  ABDOMEN:  Obese, positive bowel sounds.  Normal in frequency and pitch.  No bruits, rebound, guarding.  There are no midline pulsatile masses.  No hepatomegaly, no splenomegaly.  SKIN:  No rashes, no nodules.  EXTREMITIES:  Pulses 2+ throughout.  No cyanosis, no clubbing, no edema.  NEUROLOGIC:  Alert and oriented to person, place, and time.  Cranial  nerves II-XII grossly intact.  Motor grossly intact throughout.   EKG:  Sinus rhythm, rate 85, axis within normal limits, intervals within  normal limits.  No acute ST-T wave changes.   ASSESSMENT/PLAN:  1. Cardiomyopathy:  It has been  a couple of years since I have      reassessed the patient's ejection fraction.  She has a little more      dyspnea than she had previously.  Therefore, I will go ahead and      get an echocardiogram.  Further evaluation based on these results.  2. Depression:  This is the patient's most profound problem.  She has      done very well in the past to lift herself up from difficult      situations and manage her psychiatric problems.  I gave her      encouragement that she could continue to do the same.  She is going      to follow up with mental health.  She is having some trouble      getting into Dr. Benjaman Lobe office, some problems including the pain      she is having after a fall.  I will talk with Dr. Reche [name] to see if      we can facilitate her followup with him.  3. Obesity:  She is encouraged to continue the exercise and lose      weight.  4. Followup:  We will see her back in six months or sooner if needed.     [name], MD, [facility]  Electronically Signed     JH/MedQ  DD: 01/05/2007  DT: 01/06/2007  Job #: [id number]   cc:   [name], M.D.

## 2010-07-10 NOTE — Assessment & Plan Note (Signed)
[facility]                            CARDIOLOGY OFFICE NOTE   REET, SCHARRER                       MRN:          [id number]  DATE:07/25/2006                            DOB:          [birth year]    REASON FOR VISIT:  [name] is an add-on today for nausea and just  not feeling well.   She is going through a lot with her husband and has had a lot of anxiety  recently.   She is followed by Dr. 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  Dr.  [name] saw her on Jul 15, 2006 at which time she was having some  lightheadedness.  He cut back on her Coreg from 37.5 to 25 mg b.i.d.  Remarkably, she was mildly orthostatic that day going from 123 to 108.   She is not having any more problems per se with orthostatic symptoms.  Her biggest issue is just nausea without vomiting.  She has had no  fever, chills, headache, abdominal pain, cramps, night sweats, melena,  hematochezia, diarrhea or constipation.   MEDICATIONS:  Her medications are unchanged except the Coreg is now at  25 mg b.i.d.  She is on an excellent program.   She has been eating on a regular basis and tries to eat five small meals  a day.  She stays well hydrated.   PHYSICAL EXAMINATION:  VITAL SIGNS:  Her blood pressure today is 116/81,  pulse is 87 and regular.  Her weight is 235 which is identical to what  she was on Jul 15, 2006.  GENERAL APPEARANCE:  She is in no acute distress.  She looks very  depressed and tired.  HEENT:  Normocephalic, atraumatic.  Pupils equal, round, reactive to  light and accommodation. Extraocular movements intact. Sclerae clear.  Facial symmetry is normal.  Dentition is satisfactory.  NECK:  Supple.  Carotid upstrokes are equal bilaterally without bruits.  There is no JVD.  Thyroid is not enlarged.  Trachea is midline.  LUNGS:  Clear to auscultation.  HEART:  Reveals a poorly appreciated PMI.   She has normal S1 and S2  without gallop.  ABDOMEN:  Exam is soft, nondistended.  She is obese making organomegaly  difficult to assess.  There is no discrete tenderness.  There is  specifically no right upper quadrant tenderness or Murphy's sign.  EXTREMITIES:  Reveal trace edema.  Pulses are present.  NEUROLOGICAL:  Intact.  SKIN:  Is unremarkable.   DISCUSSION:  I had a nice talk with [name].  I think this is mostly  related to stress.  I do not see any organic etiology for her nausea  today.  I think she is euvolemic and I have made no  changes in her program.  I think cutting the Coreg back has helped her  symptomatically.  I will have her return to Dr. [name] as scheduled.     Thomas C. Daleen Squibb, MD, [facility]  Electronically Signed    TCW/MedQ  DD: 07/25/2006  DT: 07/25/2006  Job #:  [id number]   cc:   [name], MD, [facility]

## 2010-07-13 NOTE — Assessment & Plan Note (Signed)
[facility]                              CARDIOLOGY OFFICE NOTE   Samantha, FERRAIOLO                       MRN:          [id number]  DATE:01/09/2006                            DOB:          1966-02-11    The patient is a pleasant 45 year old white female with a prior history of a  dilatated cardiomyopathy. The patient presents for follow-up of the above.  She has done well since I last saw her. She spent a month in [place] and did  a lot of walking there. With that, she did not have any chest pain or  excessive dyspnea. She has had no palpitations, pre-syncope, or syncope. She  has had no PND or orthopnea.   PAST MEDICAL HISTORY:  1. Cardiomyopathy (questionably related to pregnancy).  2. Morbid obesity.  3. Hypertension.  4. Depression.  5. Attention deficit disorder.   ALLERGIES:  PENICILLIN.   MEDICATIONS:  1. Multivitamin.  2. Klor-Con 10 mEq daily.  3. Coreg 37.5 mg b.i.d.  4. Lamictal 200 mg b.i.d.  5. Enalapril 200 mg b.i.d.  6. Nuva-Ring birth control.  7. Adderall 10 mg daily.   REVIEW OF SYSTEMS:  As stated in the HPI and otherwise negative for other  systems.   PHYSICAL EXAMINATION:  The patient is in no distress. She has a pleasant  affect. Her blood pressure is 126/80, heart rate is 96 and regular, weight  282 pounds, body mass index is 46.  HEENT: Eyes unremarkable. Pupils equal, round, and reactive to light. Fundi  within normal limits. Oral mucosa is unremarkable.  NECK: No jugular venous distention at 45 degrees. Carotid upstroke brisk and  symmetrical. No bruit, no thyromegaly.  LYMPHATICS: No adenopathy.  LUNGS: Clear to auscultation bilaterally.  BACK: No costovertebral angle tenderness.  CHEST: Unremarkable.  HEART: PMI not displaced or sustained. S1, S2 within normal limits. No S3.  No S4. No murmurs.  ABDOMEN: Obese, positive bowel sounds, normal in frequency, pitch. No  bruits. No rebounds. No guarding. No  midline pulsatile mass. No  hepatomegaly, splenomegaly.  SKIN: No rashes, no nodules.  EXTREMITIES: 2+ pulses, no edema.   EKG: Sinus rhythm, rate 96, axis within normal limits, intervals within  normal limits, no acute ST wave change.   ASSESSMENT/PLAN:  1. Cardiomyopathy. The patient has had much improved ejection fraction.      She is having no symptoms at this point. She will continue with      medications as listed. No further cardiovascular testing is suggested.  2. Obesity. She understands the need to lose weight with diet and      exercise.  3. Followup. Will see her back in 6 months.     [name], MD, [facility]  Electronically Signed    JH/MedQ  DD: 01/09/2006  DT: 01/09/2006  Job #: [id number]   cc:   Dineen Kid. Reche Dixon, M.D.

## 2010-07-13 NOTE — Group Therapy Note (Signed)
NAMETIJA, BISS NO.:  [id number]   MEDICAL RECORD NO.:  [id number]          PATIENT TYPE:  WOC   LOCATION:  [facility]                   FACILITY:  [facility]   PHYSICIAN:  Tinnie Gens, MD        DATE OF BIRTH:  [birth year]   DATE OF SERVICE:  04/05/2005                                    CLINIC NOTE   CHIEF COMPLAINT:  Yearly examination.   HISTORY OF PRESENT ILLNESS:  Patient is a 45 year old gravida 2, para 1-0-1-  1 who is an established patient of this practice and has been treated for  amenorrhea and anovulatory bleeding.  In the past she has previously been on  NuvaRing.  She had the Mirena IUD removed previously.  The NuvaRing seems to  be working well for her.   PAST MEDICAL HISTORY:  1.  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  2.  ADHD.  3.  Depression.  4.  Anxiety.   PAST SURGICAL HISTORY:  1.  D&C.  2.  Cholecystectomy.   OB HISTORY:  She is a G2, P1.  One vaginal delivery.  One AB for medical  necessity secondary to cardiomyopathy.   GYN:  No history of abnormal Pap.  No history of STDs.   SOCIAL HISTORY:  She is a homemaker and disabled.  She denies tobacco,  alcohol, or drug use.   FAMILY HISTORY:  Significant for hypertension, diabetes, and colitis.   MEDICATIONS:  1.  Coreg 2.5 mg one p.o. b.i.d.  2.  Furosemide 25 mg one p.o. daily.  3.  Klor-Con 10 mEq one p.o. daily.  4.  Enalapril 20 mg one p.o. b.i.d.  5.  Lamictal 20 mg one p.o. b.i.d.  6.  Adderall 10 mg one p.o. b.i.d.  7.  NuvaRing one every month.   ALLERGIES:  PENICILLIN.   14-point review of systems were reviewed.  The patient complains of some  sinus pressure and ear pain today.  She had previously been on medications  for this and has followed up with some physician.  She denies shortness of  breath, chest pain, nausea, vomiting, diarrhea, constipation, and abdominal  pain, headache, vision changes.  She does report some  mild memory loss.  This has been going on for years.  She denies significant lower extremity  swelling.  She reports normal cycles.   PREVENTIVE SERVICES:  Last Pap smear was October of 2005.  She has never had  a mammogram.  She turns 40 this year and no history of colonoscopy.  Patient  says maybe had cholesterol checked by her cardiologist who is at [facility].   PHYSICAL EXAMINATION:  VITAL SIGNS:  As noted in the chart.  Weight is 272  today.  Blood pressure 121/76, pulse 92.  GENERAL:  She is an obese female in no acute distress.  NECK:  Supple with normal thyroid.  LUNGS:  Clear bilaterally.  CV:  Regular rate and rhythm.  No murmurs, rubs, or gallops.  ABDOMEN:  Soft, nontender, nondistended.  BREASTS:  Symmetric with everted  nipples.  No significant masses detected.  There is no supraclavicular or axillary adenopathy.  GENITOURINARY:  Normal external female genitalia.  The vagina is pink and  rugated.  Cervix is posterior, small, without lesion.  The uterus and adnexa  cannot be well appreciated secondary to body habitus.   IMPRESSION:  1.  Yearly examination.  2.  Anovulatory amenorrhea.  Cycles regulated with NuvaRing.  3.  Significant history of postpartum cardiomyopathy.  4.  Anxiety, depression, and ADHD.   PLAN:  1.  Have refilled her NuvaRing today.  2.  Have scheduled her for a mammogram coming in April.  3.  If the patient's next Pap is normal she can probably get every other      year Paps.  4.  Follow up with primary care physician for other preventive medication      needs.           ______________________________  Tinnie Gens, MD     TP/MEDQ  D:  04/05/2005  T:  04/05/2005  Job:  [id number]

## 2010-07-13 NOTE — Group Therapy Note (Signed)
[name], [name] NO.:  [id number]   MEDICAL RECORD NO.:  [id number]          PATIENT TYPE:  WOC   LOCATION[facility]  [facility]                   FACILITY:  [facility]   PHYSICIAN:  Argentina Donovan, MD        DATE OF BIRTH:  May 31, 1965   DATE OF SERVICE:  08/30/2004                                    CLINIC NOTE   REASON FOR VISIT:  The patient is a 45 year old white female gravida 2 para  1-0-1-1 who had severe cardiomyopathy with her last pregnancy and was sent  to [place] for birth control. We have tried many methods. She could not tolerate  the Mirena IUD because of cramps and has been fairly successful on the  NuvaRing over the past year. Several months ago she started having  breakthrough bleeding, it stopped for a month, has started again, and it  seems to be working fine. We have discussed with her the European method for  taking it. She is going to continue on the NuvaRing. However, she did go in  with her bleeding problem to the emergency room and they got an ultrasound  which showed two tiny 1 cm fibroids and a normal-size uterus with a normal  endometrium. Other than that, she has been in good health and her  cardiomyopathy has not posed any problem. She does have a problem with  fatigue and she does weigh 276 pounds and is 5 feet 3 inches. I talked to  her about her waking and sleeping habits. She does snore, she does wake up  not well rested, and I think that sleep apnea may very well be a problem  with this patient. We have, number one, suggested that before she tries to  get pregnant again she talks to a cardiologist who is familiar with  cardiomyopathy in a pregnant patient and I suggested that she call one of  the [facility]. Secondly, when she gets back from [place],  we will go ahead and get her scheduled for a sleep study. And thirdly, will  have her continue on the NuvaRing the way that she is doing. In the event  that the breakthrough bleeding  starts again, I would do a D&C and  hysteroscopy to evaluate the endometrial cavity.       PR/MEDQ  D:  08/30/2004  T:  08/30/2004  Job:  [id number]

## 2010-07-13 NOTE — Group Therapy Note (Signed)
NAMESIMAYA, LUMADUE NO.:  [id number]   MEDICAL RECORD NO.:  [id number]          PATIENT TYPE:  WOC   [facility]ATION[facility]                   [facility]ACILITY[facility]  [facility]   PHYSICIAN:  Argentina Donovan, MD        DATE OF BIRTH:  28-Jul-1965   DATE OF SERVICE:  12/20/2003                                    CLINIC NOTE   HISTORY:  The patient is a 45 year old 0, 1, 0, 0, 1 with a severe  cardiomyopathy, probably secondary to her last pregnancy, who has been  warned never to get pregnant again, and has been on NuvaRing with success.  For financial reasons she switched for a short time to the Ortho-Novum and  has had some heavy bleeding since then, but is going back on the NuvaRing.  Her other medications are __________, furosemide, Klor-Con and Enalapril.   PHYSICAL EXAMINATION:  ABDOMEN:  Soft, nontender, with no masses, or  organomegaly.  PELVIC:  External genitalia normal.  Uterus within normal limits.  Vagina is  clean, well-rugated.  Cervix is clean, nulliparous.  I could not do on  palpating because of the habitus of the patient, nor could the adnexa.  A  Pap smear was taken.  An annual NuvaRing was given to the patient, and she will be taking it with  a three-month on, one week off schedule.      PR/MEDQ  D:  12/20/2003  T:  12/20/2003  Job:  [id number]

## 2010-07-13 NOTE — Group Therapy Note (Signed)
   [name], [name]                          ACCOUNT NO.:  [id number]   MEDICAL RECORD NO.:  [id number]                   PATIENT TYPE:  OUT   LOCATION:  [facility]                           FACILITY:  [facility]   PHYSICIAN:  Argentina Donovan, MD                     DATE OF BIRTH:  09/23/1965   DATE OF SERVICE:  12/14/2002                                    CLINIC NOTE   CHIEF COMPLAINT:  This is a 45 year old patient who we saw shortly after a  miscarriage in August who wanted an IUD but because she had just had a D&C  we suggested the NuvaRing for one month and then we would put the IUD in  later.  She came in because she was at her wits end weighing 290 pounds,  having a cardiomyopathy which really contraindicated any further  pregnancies.  We tried the IUD Mirena on her in September and she came in  today unable to tolerate it with severe cramping, so the IUD was removed.  Apparently, her cardiologist has told her that she should not be on a birth  control pill but I see no reason not to put her on the pill and she has no  other alternative.  We want to avoid surgery.  She cannot tolerate the IUD.  She has no money to afford either the patch or the NuvaRing, and she is  afraid of the weight gain from the Depo-Provera, and we can probably supply  her every three months with at least three packs gratis of birth control  pills from our sample supply.  This patient has serious problems and I think  we should work with her to see if we cannot help her solve some of these, at  least as far as birth control goes.  I have told her to discuss with her  cardiologist what the basic reason is she did not want her to go on the pill  and see if she had any alternative.  Her husband is from [place] and so  vasectomy is out of the question.   IMPRESSION:  Cardiomyopathy, for family planning consultation.                                               Argentina Donovan, MD    PR/MEDQ  D:  12/14/2002  T:   12/14/2002  Job:  [id number]

## 2010-07-13 NOTE — Group Therapy Note (Signed)
NAMEJACLYNE, [name] NO.:  [id number]   MEDICAL RECORD NO.:  [id number]          PATIENT TYPE:  WOC   [facility]ATION[facility]                   [facility]   PHYSICIAN:  Carolanne Grumbling, M.D.   DATE OF BIRTH:  1965/09/25   DATE OF SERVICE:                                    CLINIC NOTE   HISTORY OF PRESENT ILLNESS:  The patient is a 45 year old G1, P1 who  presents with irregular menses.  She was last seen by Dr. Okey [name] on August 30, 2004 for heavy periods.  Please see dictation for full details.  Today, the  patient reports that Jul 23, 2004 through August 25, 2004 she bled everyday and  then, on August 26, 2004, she placed a Nuvaring in, and did not DC that until  September 16, 2004.  She placed another Nuvaring on September 23, 2004, and will not  remove it until October 14, 2004.  The patient is concerned because she did  not have a period following the removal of the last Nuvaring.  She endorses  multiple menstrual cramps.  She does not think she could be pregnant.   PHYSICAL EXAMINATION:  VITAL SIGNS:  Temperature 99.9, blood pressure  117/84, pulse 97, weight 275.8, height 5 feet 3 inches.  GENERAL:  Well-developed, well-nourished female in no apparent distress.   LABORATORY DATA:  Urine beta hCG is negative.   IMPRESSION:  Dysfunctional uterine bleeding; currently amenorrhea.   PLAN:  She is discussed with Dr. Okey [name].  He reassures the patient that this  can be normal, and no interventions are warranted at this time.  She is to  continue using the Nuvaring, and after her next cycle, she can start using  the European method of using the Nuvaring continuously.  If she continues to  be amenorrheic following those 2 cycles then she can return to clinic to  discuss her options if she so desires.           ______________________________  Carolanne Grumbling, M.D.     TW/MEDQ  D:  10/11/2004  T:  10/11/2004  Job:  [id number]

## 2010-07-13 NOTE — Discharge Summary (Signed)
[name], [name] NO.:  [id number]   MEDICAL RECORD NO.:  [id number]          PATIENT TYPE:  IPS   LOCATION:  0502                          FACILITY[facility]   PHYSICIAN:  Geoffery Lyons, M.D.      DATE OF BIRTH:  December 11, 1965   DATE OF ADMISSION:  10/03/2006  DATE OF DISCHARGE:  10/06/2006                               DISCHARGE SUMMARY   CHIEF COMPLAINT AND PRESENT ILLNESS:  This was the first admission to  [facility] for this 45 year old divorcing white  female.  Presented to the ED at [facility].  EMS brought her in.  She  had taken an unknown amount of Klonopin.  Second suicide gesture.  Being  prescribed Klonopin and Adderall.  Apparently, she went to a therapy  session where her husband indicated that he was ready to not be married  anymore.  This triggered this attempt.  They had been having issues for  quite some time.   PAST PSYCHIATRIC HISTORY:  Under the care of [name] and [name].   ALCOHOL/DRUG HISTORY:  Denies active use of any substances.   MEDICATIONS:  Adderall 10 mg twice a day, Klonopin 0.5 mg twice a day,  Coreg 25 mg twice a day, enalapril 20 mg twice a day, Klor-Con 10 mg per  day, Lamictal 200 mg in the morning and at bedtime, Lasix 20 mg per day.   PHYSICAL EXAMINATION:  Performed and failed to show any acute findings.   LABORATORY DATA:  Unremarkable.   MENTAL STATUS EXAM:  Alert, cooperative female, irritable, somewhat  disorganized, some pressure, evasive in answering questions about  overdose and circumstances that led to it.  No evidence of delusions.  Mood is anxious, irritable, depressed.  Affect is constricted.  No  active suicidal or homicidal ideation.  No hallucinations.  Cognition  well-preserved.   ADMISSION DIAGNOSES:  AXIS I:  Mood disorder not otherwise specified.  Attention-deficit hyperactivity disorder, by history.  AXIS II:  No diagnosis.  AXIS III:  Cardiomyopathy, hypertension.  AXIS  IV:  Moderate.  AXIS V:  GAF upon admission 35; highest GAF in the last year 60.   HOSPITAL COURSE:  She was admitted.  She was started in individual and  group psychotherapy.  She reported a history of depression and anxiety  that turned out to be ADHD.  Disorganized, unreliable historian.  By  October 06, 2006, endorsed she was having a difficult time.  She had been  in this area for 4-1/2 years.  Husband in [place].  She is raising  their 40-year-old child.  He was coming __________  to months to be here  with them.  The plan was for eventually them getting back together but  it was taking too long to get reunited.  Apparently, husband called her,  he is Muslim, and he went to [place] and got married to another female.  According to the patient and his belief system, he can marry more than  once.  This has created a lot of stress for her.  Endorsed depression,  ADHD.  She was apparently given Cymbalta and increased up to 60 mg and  this produced mood swings with suicidal thoughts.  She was told to go  back down to 30.  She has been very upset, irritable, agitated, somewhat  circumstantial, tangential.  At the time of this evaluation, more  focused, more contained, endorsed she was not crazy, that she felt these  were side effects from the medication and that she really was ready to  go.  Mother was in this area from [place].  Endorsed that she has a  good support system.  Endorsed no suicidal or homicidal ideation.  Endorsed that she just got upset with the situation with the husband and  that Cymbalta 60 mg per day threw her into some mood swings.  Somewhat  anxious but mostly upset because she was in the hospital so we had a  very hard time trying to establish a baseline.  We worked on having a  family session with the mother where all these things could be  clarified.  The social worker, [name], and got interview with the  mother.  Apparently, the mother stated that the  patient needed to stay  in the hospital longer due to being angry with the husband and need to  have the hole repaired in the roof of the home prior to discharge.  The  patient wanted to leave the hospital and those were not criteria to  maintain her.  She continued to deny active suicidal or homicidal  ideation.  There was a family session on October 06, 2006.  She said she  was ready to go home.  Wanted to see her son.  She said that she  understood that the relationship was over, wanted a divorce, but was  wanting to be home and take care of her child, did not want him to leave  to go to [place] and take him with him.  As she was stable enough and  the family, more so the mother and the best friend, were in the session  and they were concurring that she was going to recover faster being at  home, we went ahead and discharged to outpatient follow-up.   DISCHARGE DIAGNOSES:  AXIS I:  Mood disorder not otherwise specified.  Attention-deficit hyperactivity disorder, by history.  AXIS II:  No diagnosis.  AXIS III:  Cardiomyopathy, hypertension.  AXIS IV:  Moderate.  AXIS V:  GAF upon discharge 55-60.   DISCHARGE MEDICATIONS:  1. K-Dur 10 mEq daily.  2. Lamictal 200 mg in the morning, 200 mg at bedtime.  3. Lasix 20 mg per day.  4. Coreg 25 mg twice a day.  5. Vasotec 20 mg twice a day.   FOLLOWUP:  [name] at [facility] and [name] for  psychotherapy.      Geoffery Lyons, M.D.  Electronically Signed     IL/MEDQ  D:  10/28/2006  T:  10/28/2006  Job:  [id number]

## 2010-07-13 NOTE — Op Note (Signed)
   [name], [name]                          ACCOUNT NO.:  [id number]   MEDICAL RECORD NO.:  [id number]                   PATIENT TYPE:  AMB   LOCATION:  SDC                                  FACILITY:  [facility]   PHYSICIAN:  Ilda Mori, M.D.                DATE OF BIRTH:  1965/07/10   DATE OF PROCEDURE:  09/20/2002  DATE OF DISCHARGE:                                 OPERATIVE REPORT   PREOPERATIVE DIAGNOSIS:  Therapeutic abortion.   POSTOPERATIVE DIAGNOSIS:  Therapeutic abortion.   PROCEDURE:  Dilatation evacuation.   SURGEON:  Ilda Mori, M.D.   ANESTHESIA:  IV sedation with paracervical block.   ESTIMATED BLOOD LOSS:  20 mL.   FINDINGS:  Products of conception consistent with a seven-week intrauterine  pregnancy.   INDICATIONS FOR PROCEDURE:  This is a 45 year old gravida 3, para 1 female  who was found to have an intrauterine pregnancy on ultrasound of  approximately seven weeks gestation at this time.  The patient's past  medical history is complicated by a nonischemic cardiomyopathy which  occurred after the birth of her first child in February of 2003.  Her last  evaluation in March of 2004, showed an ejection fraction of 30 to 40% with  diffuse left ventricular hypokinesis.  Her cardiologist and family doctor  and a perinatal consultation all agreed that to continue this present  pregnancy would be a potentially life-threatening situation for the mother.  After this counseling, the patient elected to terminate the pregnancy.   DESCRIPTION OF PROCEDURE:  The patient was taken to the operating room and  placed in the dorsal lithotomy position.  IV sedation was administered.  The  perineum and vagina were prepped and draped in sterile fashion.  The  anterior lip of the cervix was grasped with single-tooth tenaculum, 20 mL of  1% lidocaine was infiltrated in the paracervical tissues.  The internal os  of the cervix was dilated with Shawnie Pons dilators to 21 [place].  A  #7 suction  curette was introduced into the endometrial cavity and the products of  conception were evacuated.  The procedure was then terminated and the  patient left the operating room in good condition.                                               Ilda Mori, M.D.   RK/MEDQ  D:  09/20/2002  T:  09/20/2002  Job:  [id number]   cc:   Jules Schick, M.D.  [facility]  [facility]   [name], M.D.

## 2010-07-25 ENCOUNTER — Encounter: Payer: Self-pay | Admitting: Internal Medicine

## 2010-07-26 ENCOUNTER — Telehealth: Payer: Self-pay | Admitting: *Deleted

## 2010-07-26 ENCOUNTER — Ambulatory Visit (INDEPENDENT_AMBULATORY_CARE_PROVIDER_SITE_OTHER)
Admission: RE | Admit: 2010-07-26 | Discharge: 2010-07-26 | Disposition: A | Discharge status: Home / Self Care | Payer: Medicare Other | Source: Ambulatory Visit | Attending: Internal Medicine | Admitting: Internal Medicine

## 2010-07-26 ENCOUNTER — Encounter: Payer: Self-pay | Admitting: Internal Medicine

## 2010-07-26 ENCOUNTER — Ambulatory Visit (INDEPENDENT_AMBULATORY_CARE_PROVIDER_SITE_OTHER): Payer: Medicare Other | Admitting: Internal Medicine

## 2010-07-26 DIAGNOSIS — M25561 Pain in right knee: Secondary | ICD-10-CM

## 2010-07-26 DIAGNOSIS — M25519 Pain in unspecified shoulder: Secondary | ICD-10-CM

## 2010-07-26 DIAGNOSIS — M25511 Pain in right shoulder: Secondary | ICD-10-CM

## 2010-07-26 DIAGNOSIS — M25569 Pain in unspecified knee: Secondary | ICD-10-CM

## 2010-07-26 MED ORDER — IBUPROFEN 800 MG PO TABS: 800.0000 mg | ORAL_TABLET | Freq: Three times a day (TID) | ORAL | Status: DC | PRN

## 2010-07-26 MED ORDER — KETOROLAC TROMETHAMINE 30 MG/ML IJ SOLN
30.0000 mg | Freq: Once | INTRAMUSCULAR | Status: AC
  Administered 2010-07-26: 30 mg via INTRAMUSCULAR

## 2010-07-26 NOTE — Progress Notes (Signed)
  Subjective:    Patient ID: [name], female    DOB: 03-10-1965, 45 y.o.   MRN: [id number]  HPI complains of right knee pain - Onset 4 weeks ago - No swelling but feels "mushy" No precipitating trauma recalled - no remote injury or hx same Also Right shoulder pain x 2 weeks - no precipitating injury Most pain with reaching behind back  Also reviewed chronic medical issues: cardiomyopathy hx-  follows with cards (hochrein) for same -  reports compliance with ongoing medical treatment and no changes in medication dose or frequency.  denies adverse side effects related to current therapy. no SOB or edema, no CP   depression/anxiety/ADHD -  follows with behav health for med mgmt of same - reports mood and behaviour have been stable without flares   HA and migraine hx -  generally controlled with lamicatal, as needed ibuprofen  no change in meds - compliant as rx'd   HTN - the patient reports compliance with medication(s) as prescribed. Denies adverse side effects -no swelling or edema, no HA or vision changes, no weakness    Past Medical History  Diagnosis Date  . ADHD   . CARDIOMYOPATHY, PERIPARTUM, POSTPARTUM   . MIGRAINE HEADACHE   . OBESITY   . DEPRESSION   . Essential hypertension, benign   . CARPAL TUNNEL SYNDROME, RIGHT   . Anxiety      Review of Systems  Cardiovascular: Negative for chest pain.  Musculoskeletal: Negative for back pain.  Neurological: Negative for weakness.       Objective:   Physical Exam BP 132/90  Pulse 103  Temp(Src) 98.1 F (36.7 C) (Oral)  Ht 5\' 4"  (1.626 m)  Wt 295 lb (133.811 kg)  BMI 50.64 kg/m2  SpO2 96% Physical Exam  Constitutional: She is morbidly obese; oriented to person, place, and time. She appears well-developed and well-nourished. No distress.  Cardiovascular: Normal rate, regular rhythm and normal heart sounds.  No murmur heard. No BLE edema. Pulmonary/Chest: Effort normal and breath sounds normal. No respiratory  distress. She has no wheezes.  Musculoskeletal: No gross deformities. R knee with boggy synovitis, tender to palpation of joint line with crepitus on ROM. R shoulder with +impingement signs (painful reaching behind back)  Wt Readings from Last 3 Encounters:  07/26/10 295 lb (133.811 kg)  08/23/09 299 lb (135.626 kg)  04/05/09 284 lb 3.2 oz (128.912 kg)        Assessment & Plan:  R knee pain - suspect underlying DJD, exac by weight/stress - check xray now - refill ibuprofen and refer to sports med/ortho R shoulder pain - tendonitis - tx as for knee with NSAIDs, xray and ortho eval/refer - if symptoms not improved with NSAIDs, could consider steroid injections IM Tordol given today for acute pain symptoms

## 2010-07-26 NOTE — Telephone Encounter (Signed)
At visit [facility] [place], med was sent there, will [facility]

## 2010-07-26 NOTE — Telephone Encounter (Signed)
Pt is requesting a 90 day supply on her Ibuprofen she states she can get for same price as a 30 day supply.Marland KitchenMarland Kitchen5/31/12@11 :37am/LMB

## 2010-07-26 NOTE — Patient Instructions (Signed)
It was good to see you today. Test(s) ordered today. Your results will be called to you after review (48-72hours after test completion). If any changes need to be made, you will be notified at that time. we'll make referral to orthopedic specialist for your pains. Our office will contact you regarding appointment(s) once made. Tordol shot for pain and refill on ibuprofen done -= Your prescription(s) have been submitted to your pharmacy. Please take as directed and contact our office if you believe you are having problem(s) with the medication(s).

## 2010-07-26 NOTE — Telephone Encounter (Signed)
Agree as here - thanks

## 2010-07-26 NOTE — Telephone Encounter (Signed)
Per md ok to send 90 day # 120. Sent new rx to pharmacy..07/26/10@12 :01pm/LMB

## 2010-08-06 ENCOUNTER — Ambulatory Visit: Payer: Medicare Other | Attending: Sports Medicine | Admitting: Physical Therapy

## 2010-08-06 DIAGNOSIS — M2560 Stiffness of unspecified joint, not elsewhere classified: Secondary | ICD-10-CM | POA: Insufficient documentation

## 2010-08-06 DIAGNOSIS — M25569 Pain in unspecified knee: Secondary | ICD-10-CM | POA: Insufficient documentation

## 2010-08-06 DIAGNOSIS — IMO0001 Reserved for inherently not codable concepts without codable children: Secondary | ICD-10-CM | POA: Insufficient documentation

## 2010-08-06 DIAGNOSIS — M25519 Pain in unspecified shoulder: Secondary | ICD-10-CM | POA: Insufficient documentation

## 2010-08-08 ENCOUNTER — Ambulatory Visit: Payer: Medicare Other | Admitting: Physical Therapy

## 2010-08-10 ENCOUNTER — Ambulatory Visit: Payer: Medicare Other | Admitting: Physical Therapy

## 2010-08-13 ENCOUNTER — Ambulatory Visit: Payer: Medicare Other | Admitting: Physical Therapy

## 2010-08-15 ENCOUNTER — Encounter: Payer: Medicare Other | Admitting: Physical Therapy

## 2010-08-17 ENCOUNTER — Encounter: Payer: Medicare Other | Admitting: Physical Therapy

## 2010-08-20 ENCOUNTER — Encounter: Payer: Medicare Other | Admitting: Physical Therapy

## 2010-08-20 ENCOUNTER — Other Ambulatory Visit: Payer: Self-pay | Admitting: Cardiology

## 2010-08-22 ENCOUNTER — Encounter: Payer: Medicare Other | Admitting: Physical Therapy

## 2010-08-24 ENCOUNTER — Encounter: Payer: Medicare Other | Admitting: Physical Therapy

## 2010-08-27 ENCOUNTER — Encounter: Payer: Medicare Other | Admitting: Physical Therapy

## 2010-08-31 ENCOUNTER — Encounter: Payer: Medicare Other | Admitting: Physical Therapy

## 2010-11-21 LAB — CK TOTAL AND CKMB (NOT AT ARMC)
CK, MB: 4
Relative Index: 3.2 — ABNORMAL HIGH
Total CK: 126

## 2010-11-21 LAB — DIFFERENTIAL
Basophils Absolute: 0.1
Basophils Relative: 1
Eosinophils Absolute: 0.2
Eosinophils Relative: 2
Lymphocytes Relative: 18
Lymphs Abs: 1.8
Monocytes Absolute: 0.8
Monocytes Relative: 7
Neutro Abs: 7.5
Neutrophils Relative %: 72

## 2010-11-21 LAB — POCT I-STAT, CHEM 8
BUN: 18
Calcium, Ion: 1.07 — ABNORMAL LOW
Chloride: 102
Creatinine, Ser: 0.8
Glucose, Bld: 99
HCT: 44
Hemoglobin: 15
Potassium: 4.1
Sodium: 138
TCO2: 27

## 2010-11-21 LAB — D-DIMER, QUANTITATIVE: D-Dimer, Quant: 0.26

## 2010-11-21 LAB — TROPONIN I
Troponin I: 0.01
Troponin I: <0.01

## 2010-11-21 LAB — CBC
HCT: 41.4
Hemoglobin: 14.4
MCHC: 34.8
MCV: 86.4
Platelets: 265
RBC: 4.78
RDW: 13.2
WBC: 10.3

## 2010-11-21 LAB — POCT CARDIAC MARKERS
CKMB, poc: 1.6
Myoglobin, poc: 40.8
Operator id: 288831
Troponin i, poc: <0.05

## 2010-12-03 ENCOUNTER — Other Ambulatory Visit: Payer: Self-pay | Admitting: *Deleted

## 2010-12-03 DIAGNOSIS — I10 Essential (primary) hypertension: Secondary | ICD-10-CM

## 2010-12-07 ENCOUNTER — Encounter (INDEPENDENT_AMBULATORY_CARE_PROVIDER_SITE_OTHER): Payer: Medicare Other

## 2010-12-07 ENCOUNTER — Telehealth: Payer: Self-pay | Admitting: Cardiology

## 2010-12-07 ENCOUNTER — Other Ambulatory Visit: Payer: Self-pay | Admitting: *Deleted

## 2010-12-07 DIAGNOSIS — R0989 Other specified symptoms and signs involving the circulatory and respiratory systems: Secondary | ICD-10-CM

## 2010-12-07 DIAGNOSIS — I10 Essential (primary) hypertension: Secondary | ICD-10-CM

## 2010-12-07 MED ORDER — CARVEDILOL 12.5 MG PO TABS: 12.5000 mg | ORAL_TABLET | Freq: Two times a day (BID) | ORAL | Status: DC

## 2010-12-07 MED ORDER — ENALAPRIL MALEATE 20 MG PO TABS: 20.0000 mg | ORAL_TABLET | Freq: Two times a day (BID) | ORAL | Status: DC

## 2010-12-07 NOTE — Telephone Encounter (Signed)
Walk In Pt Form " Pt Needs Refill On Medication" sent to [facility]  12/07/10/km

## 2010-12-07 NOTE — Telephone Encounter (Signed)
Per pt walk in pt needs refills and they were sent into pharmacy

## 2010-12-10 ENCOUNTER — Other Ambulatory Visit: Payer: Self-pay

## 2010-12-10 LAB — CBC
HCT: 39.9
HCT: 46
Hemoglobin: 13.5
Hemoglobin: 15.5 — ABNORMAL HIGH
MCHC: 34
MCHC: 33.7
MCV: 87.9
MCV: 88.5
Platelets: 226
Platelets: 237
RBC: 4.53
RBC: 5.2 — ABNORMAL HIGH
RDW: 14
RDW: 14.4 — ABNORMAL HIGH
WBC: 7.4
WBC: 8.2

## 2010-12-10 LAB — BASIC METABOLIC PANEL
BUN: 14
BUN: 10
CO2: 27
CO2: 29
Calcium: 9
Calcium: 9.8
Chloride: 107
Chloride: 102
Creatinine, Ser: 0.8
Creatinine, Ser: 0.87
GFR calc Af Amer: >60
GFR calc Af Amer: >60
GFR calc non Af Amer: >60
GFR calc non Af Amer: >60
Glucose, Bld: 101 — ABNORMAL HIGH
Glucose, Bld: 100 — ABNORMAL HIGH
Potassium: 3.5
Potassium: 3.7
Sodium: 139
Sodium: 140

## 2010-12-10 LAB — DIFFERENTIAL
Basophils Absolute: 0
Basophils Absolute: 0.1
Basophils Relative: 0
Basophils Relative: 1
Eosinophils Absolute: 0.1
Eosinophils Absolute: 0.2
Eosinophils Relative: 1
Eosinophils Relative: 2
Lymphocytes Relative: 21
Lymphocytes Relative: 28
Lymphs Abs: 1.6
Lymphs Abs: 2.3
Monocytes Absolute: 0.5
Monocytes Absolute: 0.6
Monocytes Relative: 7
Monocytes Relative: 7
Neutro Abs: 5.2
Neutro Abs: 5.1
Neutrophils Relative %: 70
Neutrophils Relative %: 62

## 2010-12-10 LAB — RAPID URINE DRUG SCREEN, HOSP PERFORMED
Amphetamines: POSITIVE — AB
Amphetamines: POSITIVE — AB
Barbiturates: NOT DETECTED
Barbiturates: NOT DETECTED
Benzodiazepines: POSITIVE — AB
Benzodiazepines: POSITIVE — AB
Cocaine: NOT DETECTED
Cocaine: NOT DETECTED
Opiates: NOT DETECTED
Opiates: NOT DETECTED
Tetrahydrocannabinol: NOT DETECTED
Tetrahydrocannabinol: NOT DETECTED

## 2010-12-10 LAB — ACETAMINOPHEN LEVEL
Acetaminophen (Tylenol), Serum: <0.1 — ABNORMAL LOW
Acetaminophen (Tylenol), Serum: <10 — ABNORMAL LOW

## 2010-12-10 LAB — SALICYLATE LEVEL
Salicylate Lvl: <4
Salicylate Lvl: <4

## 2010-12-10 LAB — ETHANOL
Alcohol, Ethyl (B): <5
Alcohol, Ethyl (B): <5

## 2010-12-10 LAB — PREGNANCY, URINE: Preg Test, Ur: NEGATIVE

## 2010-12-11 ENCOUNTER — Telehealth: Payer: Self-pay | Admitting: Cardiology

## 2010-12-11 DIAGNOSIS — I5022 Chronic systolic (congestive) heart failure: Secondary | ICD-10-CM

## 2010-12-11 NOTE — Telephone Encounter (Signed)
[facility] Coreg 6.25 and 12.5 in generic, or will need fax with ok on refills stating brand name only

## 2010-12-13 ENCOUNTER — Ambulatory Visit (INDEPENDENT_AMBULATORY_CARE_PROVIDER_SITE_OTHER): Payer: Medicare Other | Admitting: Cardiology

## 2010-12-13 ENCOUNTER — Encounter: Payer: Self-pay | Admitting: Cardiology

## 2010-12-13 ENCOUNTER — Other Ambulatory Visit: Payer: Self-pay | Admitting: *Deleted

## 2010-12-13 DIAGNOSIS — I10 Essential (primary) hypertension: Secondary | ICD-10-CM

## 2010-12-13 DIAGNOSIS — F909 Attention-deficit hyperactivity disorder, unspecified type: Secondary | ICD-10-CM

## 2010-12-13 DIAGNOSIS — E669 Obesity, unspecified: Secondary | ICD-10-CM

## 2010-12-13 DIAGNOSIS — O903 Peripartum cardiomyopathy: Secondary | ICD-10-CM

## 2010-12-13 MED ORDER — AMLODIPINE BESYLATE 5 MG PO TABS: 5.0000 mg | ORAL_TABLET | Freq: Every day | ORAL | Status: DC

## 2010-12-13 MED ORDER — CARVEDILOL 6.25 MG PO TABS: 6.2500 mg | ORAL_TABLET | Freq: Two times a day (BID) | ORAL | Status: DC

## 2010-12-13 MED ORDER — CARVEDILOL 25 MG PO TABS: 25.0000 mg | ORAL_TABLET | Freq: Two times a day (BID) | ORAL | Status: DC

## 2010-12-13 MED ORDER — FUROSEMIDE 20 MG PO TABS: 20.0000 mg | ORAL_TABLET | Freq: Every day | ORAL | Status: DC

## 2010-12-13 MED ORDER — ENALAPRIL MALEATE 20 MG PO TABS: 20.0000 mg | ORAL_TABLET | Freq: Two times a day (BID) | ORAL | Status: DC

## 2010-12-13 MED ORDER — MULTI-VITAMIN/MINERALS PO TABS: 1.0000 | ORAL_TABLET | Freq: Every day | ORAL | Status: DC

## 2010-12-13 MED ORDER — POTASSIUM CHLORIDE CRYS ER 10 MEQ PO TBCR: 10.0000 meq | EXTENDED_RELEASE_TABLET | Freq: Every day | ORAL | Status: DC

## 2010-12-13 NOTE — Telephone Encounter (Signed)
Patient has an appointment today. [name] will verify dose with patient today.

## 2010-12-13 NOTE — Telephone Encounter (Signed)
Patient has an appointment today. [name] will verify dose with patient. [name]

## 2010-12-13 NOTE — Patient Instructions (Signed)
Please start Norvasc (amlodipine) 5 mg a day Continue all other medications as listed  Please keep a blood pressure diary and bring with you to your appointment in 3 weeks.  Your physician has requested that you have an echocardiogram the same day you see Tereso Newcomer. Echocardiography is a painless test that uses sound waves to create images of your heart. It provides your doctor with information about the size and shape of your heart and how well your heart's chambers and valves are working. This procedure takes approximately one hour. There are no restrictions for this procedure.  Follow up with Tereso Newcomer, PA in 3 weeks.

## 2010-12-13 NOTE — Progress Notes (Signed)
HPI The patient was referred back by her psychiatrist.  She is do for follow up of her HTN and previous cardiomyopathy.  I have not seen her for a year. Her psychiatrist recently questioned whether she should continue Adderall as her blood pressure has been elevated. This medication has worked remarkably for her ADD. The patient does not want to stop this drug and less there is a significant contraindication.  She had a previous cardiomyopathy with a mildly reduced ejection fraction. However, her last echo in 08 demonstrated an EF of 60%. I did put a 24-hour blood pressure monitor on her and it demonstrated that her blood pressures are consistently elevated even during sleep. This is predominantly systolic. She has had one episode of chest discomfort about 2 days ago. This was a tightness while shopping. She became anxious with this. This passed after several minutes. She's had some palpitations rarely. She's had some dizziness and lightheadedness. She climbs stairs frequently and cannot bring on any symptoms with this. She does have chronic shortness of breath probably related to weight and deconditioning. However, she is not describing any new PND or orthopnea.  Allergies  Allergen Reactions  . Duloxetine     REACTION: Sucidal thoughts  . Penicillins     REACTION: rash    Current Outpatient Prescriptions  Medication Sig Dispense Refill  . amphetamine-dextroamphetamine (ADDERALL XR) 20 MG 24 hr capsule Take 20 mg by mouth every morning.        . carvedilol (COREG) 25 MG tablet Take 25 mg by mouth 2 (two) times daily with a meal. PT TAKES BOTH 6.25 mg bid and 25mg  bid       . carvedilol (COREG) 6.25 MG tablet Take 6.25 mg by mouth 2 (two) times daily with a meal.       . clonazePAM (KLONOPIN) 1 MG tablet Take 1 mg by mouth 2 (two) times daily as needed.        . enalapril (VASOTEC) 20 MG tablet Take 1 tablet (20 mg total) by mouth 2 (two) times daily.  180 tablet  1  . furosemide (LASIX) 20 MG  tablet TAKE 1 TABLET EVERY DAY  90 tablet  2  . ibuprofen (ADVIL,MOTRIN) 800 MG tablet Take 1 tablet (800 mg total) by mouth every 8 (eight) hours as needed.  120 tablet  1  . KLOR-CON M10 10 MEQ tablet TAKE 1 TABLET EVERY DAY  90 tablet  2    Past Medical History  Diagnosis Date  . ADHD   . CARDIOMYOPATHY, PERIPARTUM, POSTPARTUM     resolved, EF 60%  . MIGRAINE HEADACHE   . OBESITY   . DEPRESSION   . Essential hypertension, benign   . CARPAL TUNNEL SYNDROME, RIGHT   . Anxiety     Past Surgical History  Procedure Date  . Gastric bypass   . Child birth 2003  . Cardiomyopathy 2003    ROS:   PHYSICAL EXAM BP 142/92  Pulse 93  Ht 5\' 4"  (1.626 m)  Wt 292 lb (132.45 kg)  BMI 50.12 kg/m2 GENERAL:  Well appearing HEENT:  Pupils equal round and reactive, fundi not visualized, oral mucosa unremarkable NECK:  No jugular venous distention, waveform within normal limits, carotid upstroke brisk and symmetric, no bruits, no thyromegaly LYMPHATICS:  No cervical, inguinal adenopathy LUNGS:  Clear to auscultation bilaterally BACK:  No CVA tenderness CHEST:  Unremarkable HEART:  PMI not displaced or sustained,S1 and S2 within normal limits, no S3, no S4, no clicks,  no rubs, no murmurs ABD:  Flat, positive bowel sounds normal in frequency in pitch, no bruits, no rebound, no guarding, no midline pulsatile mass, no hepatomegaly, no splenomegaly, obese EXT:  2 plus pulses throughout, no edema, no cyanosis no clubbing SKIN:  No rashes no nodules NEURO:  Cranial nerves II through XII grossly intact, motor grossly intact throughout PSYCH:  Cognitively intact, oriented to person place and time  EKG:  Sinus rhythm, rate 94, LAD, LVH by voltage criteria, intervals within normal limits, no acute ST-T wave changes.   ASSESSMENT AND PLAN

## 2010-12-13 NOTE — Assessment & Plan Note (Signed)
We have discussed at length multiple times in the past the need for weight loss.

## 2010-12-13 NOTE — Assessment & Plan Note (Signed)
This has been a severe problem in the past and it has responded very well to her current meds.  I will check the echo and control the BP.  I do not see a clear contraindication to her current meds if we get her BP controlled.

## 2010-12-13 NOTE — Assessment & Plan Note (Signed)
I will add Norvasc 5 mg daily to her meds.  She will keep a BP diary.  We will make further adjustments based on home readings.

## 2010-12-13 NOTE — Telephone Encounter (Signed)
Pt saw Dr [name] today refill was done by [facility] RN

## 2010-12-13 NOTE — Assessment & Plan Note (Signed)
I will order a follow up echo when she comes back with her BP diary in about 3 weeks.

## 2010-12-19 NOTE — Telephone Encounter (Signed)
Pt calling wanting 3 month supply of multi-vitamins. Pt said one month supply was called and would like that changed to 3 month supply. Please return pt call to discuss if other 2 months will be called into pharmacy for pt. Please call back.

## 2010-12-19 NOTE — Telephone Encounter (Signed)
Pt calling stating that medicare doesn't pay for pt Coreg and pt is allergic to the generic and pt wants to know if nurse can get pt samples of coreg. Please return pt call to discuss further.

## 2010-12-20 MED ORDER — MULTIPLE VITAMINS-MINERALS PO CAPS: 1.0000 | ORAL_CAPSULE | Freq: Every day | ORAL | Status: DC

## 2010-12-20 NOTE — Telephone Encounter (Signed)
I have obtained the prior approval paperwork (after several attempts to have it faxed to [place]).  It has been completed and will be faxed back to the company along with prior documentation of pts need for Coreg brand name.  A need RX will be sent into the pharmacy for a 90 day supply of a multi-vitamin.  Will contact pt with approval of brand name Coreg once it has been received.

## 2010-12-20 NOTE — Telephone Encounter (Signed)
Addended by: Sharin Grave on: 12/20/2010 04:51 PM   Modules accepted: Orders

## 2011-01-03 ENCOUNTER — Other Ambulatory Visit (HOSPITAL_COMMUNITY): Payer: Medicare Other

## 2011-01-03 ENCOUNTER — Ambulatory Visit: Payer: Medicare Other | Admitting: Physician Assistant

## 2011-01-15 ENCOUNTER — Other Ambulatory Visit (HOSPITAL_COMMUNITY): Payer: Medicare Other

## 2011-01-15 ENCOUNTER — Other Ambulatory Visit: Payer: Medicare Other

## 2011-01-15 ENCOUNTER — Ambulatory Visit (INDEPENDENT_AMBULATORY_CARE_PROVIDER_SITE_OTHER): Payer: Medicare Other | Admitting: Internal Medicine

## 2011-01-15 ENCOUNTER — Other Ambulatory Visit: Payer: Self-pay | Admitting: Internal Medicine

## 2011-01-15 ENCOUNTER — Encounter: Payer: Self-pay | Admitting: Internal Medicine

## 2011-01-15 ENCOUNTER — Ambulatory Visit: Payer: Medicare Other | Admitting: Physician Assistant

## 2011-01-15 ENCOUNTER — Telehealth: Payer: Self-pay | Admitting: *Deleted

## 2011-01-15 VITALS — BP 130/90 | HR 93 | Temp 98.8°F

## 2011-01-15 DIAGNOSIS — Z23 Encounter for immunization: Secondary | ICD-10-CM

## 2011-01-15 DIAGNOSIS — J069 Acute upper respiratory infection, unspecified: Secondary | ICD-10-CM

## 2011-01-15 DIAGNOSIS — I10 Essential (primary) hypertension: Secondary | ICD-10-CM

## 2011-01-15 DIAGNOSIS — J029 Acute pharyngitis, unspecified: Secondary | ICD-10-CM

## 2011-01-15 LAB — POCT RAPID STREP A (OFFICE): Rapid Strep A Screen: NEGATIVE

## 2011-01-15 MED ORDER — PNEUMOCOCCAL VAC POLYVALENT 25 MCG/0.5ML IJ INJ: 0.5000 mL | INJECTION | Freq: Once | INTRAMUSCULAR | Status: DC

## 2011-01-15 NOTE — Patient Instructions (Signed)
It was good to see you today. Rapid strep and throat culture ordered today. Your results will be called to you after review (48-72hours after test completion). If any changes need to be made, you will be notified at that time. If you develop worsening symptoms or fever, call and we can reconsider antibiotics, but it does not appear necessary to use antibiotics at this time. Use salt water gargle and Tylenol as needed for symptoms related to viral infection Other medications reviewed and no changes recommended at this time Schedule followup appointment in 2-4 weeks once feeling well for other blood work such as diabetes and cholesterol as reviewed Flu shot and pneumonia shot done today

## 2011-01-15 NOTE — Progress Notes (Signed)
  Subjective:    Patient ID: [name], female    DOB: 08/22/1965, 45 y.o.   MRN: [id number]  HPI  complains of sore throat  Onset 4 days ago, slightly improved since onset Reports exposure to sick contacts and Strep associated with myalgias and fatigue, especially nocturnal Also dry cough in last 24 hours  Past Medical History  Diagnosis Date  . ADHD   . CARDIOMYOPATHY, PERIPARTUM, POSTPARTUM     resolved, EF 60%  . MIGRAINE HEADACHE   . OBESITY   . DEPRESSION   . Essential hypertension, benign   . CARPAL TUNNEL SYNDROME, RIGHT   . Anxiety     Review of Systems  Constitutional: Positive for chills and fatigue. Negative for fever and unexpected weight change.  Respiratory: Positive for cough. Negative for shortness of breath.   Cardiovascular: Negative for chest pain and leg swelling.  Neurological: Negative for light-headedness and headaches.       Objective:   Physical Exam BP 130/90  Pulse 93  Temp(Src) 98.8 F (37.1 C) (Oral)  SpO2 96% Wt Readings from Last 3 Encounters:  12/13/10 292 lb (132.45 kg)  07/26/10 295 lb (133.811 kg)  08/23/09 299 lb (135.626 kg)   Constitutional: She is morbidly obese but appears well-developed and well-nourished. No distress.  HENT: Head: Normocephalic and atraumatic. Ears: B TMs ok, no erythema or effusion; Nose: Nose normal.  Mouth/Throat: Oropharynx is mildly red but clear and moist. No oropharyngeal exudate.  Eyes: Conjunctivae and EOM are normal. Pupils are equal, round, and reactive to light. No scleral icterus.  Neck: Normal range of motion. Neck supple. No JVD or LAD present. No thyromegaly present.  Cardiovascular: Normal rate, regular rhythm and normal heart sounds.  No murmur heard. No BLE edema. Pulmonary/Chest: Effort normal and breath sounds normal. No respiratory distress. She has no wheezes.    Lab Results  Component Value Date   WBC 9.9 11/11/2008   HGB 14.3 12/27/2008   HCT 42.0 12/27/2008   PLT 268  11/11/2008   GLUCOSE 87 12/27/2008   CHOL 237* 05/09/2008   TRIG 136 05/09/2008   HDL 62 05/09/2008   LDLCALC 148* 05/09/2008   ALT 20 05/09/2008   AST 21 05/09/2008   NA 139 12/27/2008   K 4.0 12/27/2008   CL 103 12/27/2008   CREATININE 0.5 12/27/2008   BUN 13 12/27/2008   CO2 24 05/09/2008   TSH 3.656 04/14/2008        Assessment & Plan:  Pharyngitis, acute - recent strep exposure - suspect symptoms are viral in etiology but will check rapid strep and culture at patient request - hold antibiotics unless positive test results, symptomatic care recommended: Tylenol, hydration and salt gargles Okay for immunizations as requested

## 2011-01-15 NOTE — Telephone Encounter (Signed)
Called pt back advise her to use cold press on arm, but if she develop fever sign of severe allergic reaction SOB, wheezing, hives, weakness, fast heart beat or dizziness to go to Er or urgent care to ger evaluated. If pain isn't better please make f/u appt with Dr. Felicity Coyer to be re-evaluted....01/15/11@4 :45pm/LMB

## 2011-01-15 NOTE — Telephone Encounter (Signed)
Pt came in this am and received a pneumonia shot in her (R) arm, Pt states now arm is very painful, hot to the touch, denies any swelling or other symptoms. ? If she is having allergic reaction. Did inform pt will send msg to another md to see what they advise...01/15/11@3 :59pm/LMB

## 2011-01-15 NOTE — Assessment & Plan Note (Signed)
Addition of Norvasc to beta blocker October 2012 by cardiology Blood pressure improved - The current medical regimen is effective;  continue present plan and medications.  BP Readings from Last 3 Encounters:  01/15/11 130/90  12/13/10 142/92  07/26/10 132/90

## 2011-01-16 ENCOUNTER — Telehealth: Payer: Self-pay

## 2011-01-16 NOTE — Telephone Encounter (Signed)
[facility] Triage Call Report Triage Record Num: [id number] Operator: Sula Rumple Patient Name: [name] Call Date & Time: 01/15/2011 9:56:02PM Patient Phone: [phone] PCP: Donia Guiles Patient Gender: Female PCP Fax : [phone] Patient DOB: [birth year] [facility] Name[facility] Reason for Call: Pt calling on 01/15/11 states she had a flu shot and pneumonia shot today in the office/ Pneumonia shot injection site is swollen/hard/warm to touch/not red/painful/ swelling is the size of a palm of a hand/pt was advised ice and ibuprofen/pt is afebrile/all emergent sx ruled out per Skin Lesions protocol. Protocol(s) Used: Immunization Reactions (Pediatric) Recommended Outcome per Protocol: Provide Home/Self Care Reason for Outcome: Pneumococcus vaccine reactions Care Advice: ~ 01/15/2011 10:21:42PM Page 1 of 1 [email or web address]

## 2011-01-16 NOTE — Telephone Encounter (Signed)
Noted and agree as advised - thanks 

## 2011-01-16 NOTE — Telephone Encounter (Signed)
[facility] Triage Call Report Triage Record Num: [id number] Operator: Lyn Hollingshead Patient Name: [name] Call Date & Time: 01/15/2011 6:44:32PM Patient Phone: [phone] PCP: Donia Guiles Patient Gender: Female PCP Fax : [phone] Patient DOB: 12-22-1965 [facility] Name[facility] Reason for Call: Caller: Diandra/Patient; PCP: Rene Paci; CB#: [phone]; Call Reason: Large, Hard, Red Area On Arm Where Pt Rcvd Pneumonia Shot Today; Sx Onset: 01/15/2011; Sx Notes: ; Afebrile; Wt: ; Guideline Used: Skin Lesion; Disp:Home care advice; Appt Scheduled?: no Pt received Pneumonia vaccine today and has hard, red, swollen area. She also complain of pain in the arm. She states that the area that is swollen is about the size of her hand. Emergent s/s of Skin lesion r/o. Pt to try ice pack and Ibuprofen. F/U call scheduled. Pt will not be home until between 8:30 and 9:00pm. Recommended pt get a temp for return call, she was not at a point that she could take it during call. Protocol(s) Used: Skin Lesions Recommended Outcome per Protocol: Provide Home/Self Care Reason for Outcome: Localized soreness/tenderness, swelling or discoloration of skin at injection site (including immunizations) Care Advice: ~ Apply a cool compress to the area for 20 minutes 3 to 4 times a day to relieve discomfort. Analgesic/Antipyretic Advice - Acetaminophen: Consider acetaminophen as directed on label or by pharmacist/provider for pain or fever PRECAUTIONS: - Use if there is no history of liver disease, alcoholism, or intake of three or more alcohol drinks per day - Only if approved by provider during pregnancy or when breastfeeding - During pregnancy, acetaminophen should not be taken more than 3 consecutive days without telling provider - Do not exceed recommended dose or frequency ~ Vaccination / Immunization Advice: - Side effects of vaccination / immunization vary with each type. (See  [employer] topic "Immunizations - Adult" for specific information.) - Common mild side effects of a vaccination / immunization may include mild redness, general swelling, soreness or a bruise at the injection site, a mild or moderate fever up to 101.5 F (38.6 C), headache, muscle aches, joint aches or tiredness. These symptoms may last 1 to 3 days. - Call provider if symptoms do not improve with 3 days of home care. - Call EMS 911 if having signs and symptoms of a severe allergic reaction (such as severe difficulty breathing, throat tightness, chest pain, sudden swelling of face, lips or tongue, etc.). Life-threatening reactions from vaccines are very rare. ~ 01/15/2011 7:04:20PM Page 1 of 1 [email or web address]

## 2011-01-17 LAB — CULTURE, GROUP A STREP: Organism ID, Bacteria: NORMAL

## 2011-01-24 ENCOUNTER — Other Ambulatory Visit (HOSPITAL_COMMUNITY): Payer: Medicare Other | Admitting: Radiology

## 2011-01-24 ENCOUNTER — Telehealth: Payer: Self-pay | Admitting: *Deleted

## 2011-01-24 ENCOUNTER — Ambulatory Visit: Payer: Medicare Other | Admitting: Physician Assistant

## 2011-01-24 NOTE — Telephone Encounter (Signed)
I have returned call and spoken with psychiatrist regarding same. Psychiatrist will continue to manage Adderall prescription and will call here periodically to review her blood pressures on our records from office visit encounters here.

## 2011-01-24 NOTE — Telephone Encounter (Signed)
Requesting to speak with md. Pt came in to be evaluated. Want to rx adderal xr 20 mg daily, but pt BP was elevated. Have spoke with pt cardiologist and they will be monitoring BP. Want to discuss will pcp comfortable taking over rx the adderal...01/24/11@8 :24am/LMB

## 2011-02-01 ENCOUNTER — Ambulatory Visit: Payer: Medicare Other | Admitting: Physician Assistant

## 2011-02-01 ENCOUNTER — Other Ambulatory Visit (HOSPITAL_COMMUNITY): Payer: Medicare Other | Admitting: Radiology

## 2011-02-04 ENCOUNTER — Ambulatory Visit: Payer: Medicare Other | Admitting: Internal Medicine

## 2011-02-11 ENCOUNTER — Ambulatory Visit: Payer: Medicare Other | Admitting: Internal Medicine

## 2011-03-08 ENCOUNTER — Encounter: Payer: Self-pay | Admitting: Internal Medicine

## 2011-03-08 ENCOUNTER — Ambulatory Visit (INDEPENDENT_AMBULATORY_CARE_PROVIDER_SITE_OTHER): Payer: Medicare Other | Admitting: Internal Medicine

## 2011-03-08 ENCOUNTER — Telehealth: Payer: Self-pay | Admitting: Internal Medicine

## 2011-03-08 ENCOUNTER — Ambulatory Visit (INDEPENDENT_AMBULATORY_CARE_PROVIDER_SITE_OTHER)
Admission: RE | Admit: 2011-03-08 | Discharge: 2011-03-08 | Disposition: A | Discharge status: Home / Self Care | Payer: Medicare Other | Source: Ambulatory Visit | Attending: Internal Medicine | Admitting: Internal Medicine

## 2011-03-08 VITALS — BP 126/88 | HR 80 | Temp 98.4°F | Resp 16

## 2011-03-08 DIAGNOSIS — M542 Cervicalgia: Secondary | ICD-10-CM

## 2011-03-08 DIAGNOSIS — M436 Torticollis: Secondary | ICD-10-CM | POA: Insufficient documentation

## 2011-03-08 MED ORDER — METHOCARBAMOL 750 MG PO TABS: 750.0000 mg | ORAL_TABLET | Freq: Four times a day (QID) | ORAL | Status: AC

## 2011-03-08 NOTE — Patient Instructions (Signed)
Torticollis, Acute You have suddenly (acutely) developed a twisted neck (torticollis). This is usually a self-limited condition. CAUSES  Acute torticollis may be caused by malposition, trauma or infection. Most commonly, acute torticollis is caused by sleeping in an awkward position. Torticollis may also be caused by the flexion, extension or twisting of the neck muscles beyond their normal position. Sometimes, the exact cause may not be known. SYMPTOMS  Usually, there is pain and limited movement of the neck. Your neck may twist to one side. DIAGNOSIS  The diagnosis is often made by physical examination. X-rays, CT scans or MRIs may be done if there is a history of trauma or concern of infection. TREATMENT  For a common, stiff neck that develops during sleep, treatment is focused on relaxing the contracted neck muscle. Medications (including shots) may be used to treat the problem. Most cases resolve in several days. Torticollis usually responds to conservative physical therapy. If left untreated, the shortened and spastic neck muscle can cause deformities in the face and neck. Rarely, surgery is required. HOME CARE INSTRUCTIONS   Use over-the-counter and prescription medications as directed by your caregiver.   Do stretching exercises and massage the neck as directed by your caregiver.   Follow up with physical therapy if needed and as directed by your caregiver.  SEEK IMMEDIATE MEDICAL CARE IF:   You develop difficulty breathing or noisy breathing (stridor).   You drool, develop trouble swallowing or have pain with swallowing.   You develop numbness or weakness in the hands or feet.   You have changes in speech or vision.   You have problems with urination or bowel movements.   You have difficulty walking.   You have a fever.   You have increased pain.  MAKE SURE YOU:   Understand these instructions.   Will watch your condition.   Will get help right away if you are not  doing well or get worse.  Document Released: 02/09/2000 Document Revised: 10/24/2010 Document Reviewed: 03/22/2009 [employer] Patient Information 2012 [employer]. 

## 2011-03-09 ENCOUNTER — Encounter: Payer: Self-pay | Admitting: Internal Medicine

## 2011-03-09 NOTE — Assessment & Plan Note (Signed)
I will check a plain film of the c-spine to look for structural lesion

## 2011-03-09 NOTE — Assessment & Plan Note (Signed)
She will continue ibuprofen and will add on robaxin for additional symptom relief, also gave her pt ed material

## 2011-03-09 NOTE — Progress Notes (Signed)
Subjective:    Patient ID: [name], female    DOB: [birth year], 46 y.o.   MRN: [id number]  Neck Pain  This is a new problem. Episode onset: for 3 weeks. The problem occurs intermittently. The problem has been unchanged. The pain is associated with a sleep position (sleeping in a car during a long trip to [place]). The pain is present in the right side. The quality of the pain is described as aching. The pain is at a severity of 3/10. The pain is mild. The symptoms are aggravated by position. Stiffness is present all day. Pertinent negatives include no chest pain, fever, headaches, leg pain, numbness, pain with swallowing, paresis, photophobia, syncope, tingling, trouble swallowing, visual change, weakness or weight loss. She has tried NSAIDs for the symptoms. The treatment provided moderate relief.      Review of Systems  Constitutional: Negative.  Negative for fever and weight loss.  HENT: Positive for neck pain. Negative for trouble swallowing.   Eyes: Negative.  Negative for photophobia.  Respiratory: Negative.   Cardiovascular: Negative.  Negative for chest pain and syncope.  Gastrointestinal: Negative.   Genitourinary: Negative.   Skin: Negative.   Neurological: Negative for tingling, weakness, numbness and headaches.  Hematological: Negative.   Psychiatric/Behavioral: Negative.        Objective:   Physical Exam  Vitals reviewed. Constitutional: She is oriented to person, place, and time. She appears well-developed and well-nourished. No distress.  HENT:  Head: Normocephalic and atraumatic.  Mouth/Throat: Oropharynx is clear and moist. No oropharyngeal exudate.  Eyes: Conjunctivae are normal. Right eye exhibits no discharge. Left eye exhibits no discharge. No scleral icterus.  Neck: Normal range of motion. Neck supple. No JVD present. No tracheal deviation present. No thyromegaly present.  Cardiovascular: Normal rate, regular rhythm, normal heart sounds and intact distal  pulses.  Exam reveals no gallop and no friction rub.   No murmur heard. Pulmonary/Chest: Breath sounds normal. No stridor. No respiratory distress. She has no wheezes. She has no rales. She exhibits no tenderness.  Abdominal: Soft. Bowel sounds are normal. She exhibits no distension. There is no tenderness. There is no rebound and no guarding.  Musculoskeletal: Normal range of motion. She exhibits no edema and no tenderness.       Cervical back: Normal. She exhibits normal range of motion, no tenderness, no bony tenderness, no swelling, no edema, no deformity, no laceration, no pain, no spasm and normal pulse.  Lymphadenopathy:    She has no cervical adenopathy.  Neurological: She is alert and oriented to person, place, and time. She has normal strength. She displays no atrophy, no tremor and normal reflexes. No cranial nerve deficit or sensory deficit. She exhibits normal muscle tone. She displays a negative Romberg sign. She displays no seizure activity. Coordination and gait normal.  Reflex Scores:      Tricep reflexes are 1+ on the right side and 1+ on the left side.      Bicep reflexes are 1+ on the right side.      Brachioradialis reflexes are 1+ on the right side and 1+ on the left side.      Patellar reflexes are 1+ on the right side.      Achilles reflexes are 1+ on the right side and 1+ on the left side. Skin: Skin is warm and dry. No rash noted. She is not diaphoretic. No erythema. No pallor.  Psychiatric: She has a normal mood and affect. Her behavior is normal. Judgment  and thought content normal.          Assessment & Plan:

## 2011-03-12 ENCOUNTER — Encounter: Payer: Self-pay | Admitting: Internal Medicine

## 2011-03-13 ENCOUNTER — Ambulatory Visit: Payer: Medicare Other | Admitting: Internal Medicine

## 2011-03-25 ENCOUNTER — Encounter: Payer: Self-pay | Admitting: Internal Medicine

## 2011-03-25 ENCOUNTER — Ambulatory Visit (INDEPENDENT_AMBULATORY_CARE_PROVIDER_SITE_OTHER): Payer: Medicare Other | Admitting: Internal Medicine

## 2011-03-25 ENCOUNTER — Telehealth: Payer: Self-pay

## 2011-03-25 VITALS — BP 138/80 | HR 64 | Temp 98.7°F | Resp 20

## 2011-03-25 DIAGNOSIS — M5410 Radiculopathy, site unspecified: Secondary | ICD-10-CM | POA: Insufficient documentation

## 2011-03-25 DIAGNOSIS — M5412 Radiculopathy, cervical region: Secondary | ICD-10-CM

## 2011-03-25 DIAGNOSIS — I10 Essential (primary) hypertension: Secondary | ICD-10-CM

## 2011-03-25 DIAGNOSIS — M542 Cervicalgia: Secondary | ICD-10-CM

## 2011-03-25 DIAGNOSIS — IMO0002 Reserved for concepts with insufficient information to code with codable children: Secondary | ICD-10-CM

## 2011-03-25 DIAGNOSIS — J209 Acute bronchitis, unspecified: Secondary | ICD-10-CM

## 2011-03-25 MED ORDER — METHOCARBAMOL 750 MG PO TABS: 750.0000 mg | ORAL_TABLET | Freq: Four times a day (QID) | ORAL | Status: DC

## 2011-03-25 MED ORDER — PROMETHAZINE-DM 6.25-15 MG/5ML PO SYRP: 5.0000 mL | ORAL_SOLUTION | Freq: Four times a day (QID) | ORAL | Status: AC | PRN

## 2011-03-25 MED ORDER — OXYCODONE-ACETAMINOPHEN 7.5-325 MG PO TABS: 1.0000 | ORAL_TABLET | ORAL | Status: AC | PRN

## 2011-03-25 MED ORDER — AZITHROMYCIN 500 MG PO TABS: 500.0000 mg | ORAL_TABLET | Freq: Every day | ORAL | Status: AC

## 2011-03-25 NOTE — Assessment & Plan Note (Signed)
Continue ibuprofen and robaxin, add percocet for additional relief of symptoms, refer to pain med for evaluation and treatment

## 2011-03-25 NOTE — Assessment & Plan Note (Signed)
Start zpak for the infection and a cough suppressant 

## 2011-03-25 NOTE — Patient Instructions (Signed)

## 2011-03-25 NOTE — Progress Notes (Signed)
Subjective:    Patient ID: [name], female    DOB: [birth year], 46 y.o.   MRN: [id number]  Neck Pain  This is a recurrent problem. The current episode started more than 1 month ago. The problem occurs constantly. The problem has been gradually worsening. The pain is associated with nothing. The pain is present in the right side. The quality of the pain is described as shooting and stabbing. The pain is at a severity of 6/10. The pain is moderate. The symptoms are aggravated by coughing. The pain is worse during the day. Stiffness is present all day. Associated symptoms include paresis (in her RUE). Pertinent negatives include no chest pain, fever, headaches, leg pain, numbness, pain with swallowing, photophobia, syncope, trouble swallowing, visual change, weakness or weight loss. She has tried NSAIDs and muscle relaxants for the symptoms. The treatment provided mild relief.  Cough This is a new problem. The current episode started in the past 7 days. The problem has been gradually worsening. The problem occurs every few minutes. The cough is productive of purulent sputum. Associated symptoms include chills, nasal congestion, postnasal drip, rhinorrhea and a sore throat. Pertinent negatives include no chest pain, ear congestion, ear pain, fever, headaches, heartburn, hemoptysis, myalgias, rash, shortness of breath, sweats, weight loss or wheezing. The symptoms are aggravated by nothing. She has tried OTC cough suppressant for the symptoms. The treatment provided mild relief.      Review of Systems  Constitutional: Positive for chills. Negative for fever, weight loss, diaphoresis, activity change, appetite change, fatigue and unexpected weight change.  HENT: Positive for sore throat, rhinorrhea, sneezing, neck pain, postnasal drip and sinus pressure. Negative for hearing loss, ear pain, nosebleeds, congestion, facial swelling, drooling, mouth sores, trouble swallowing, neck stiffness, dental  problem, voice change, tinnitus and ear discharge.   Eyes: Negative.  Negative for photophobia.  Respiratory: Positive for cough. Negative for apnea, hemoptysis, choking, chest tightness, shortness of breath, wheezing and stridor.   Cardiovascular: Negative for chest pain, palpitations, leg swelling and syncope.  Gastrointestinal: Negative for heartburn, nausea, vomiting, abdominal pain, diarrhea, constipation, blood in stool and abdominal distention.  Genitourinary: Negative for dysuria, urgency, frequency, hematuria, flank pain, decreased urine volume, enuresis, difficulty urinating and dyspareunia.  Musculoskeletal: Negative for myalgias, back pain, joint swelling, arthralgias and gait problem.  Skin: Negative for color change, pallor, rash and wound.  Neurological: Negative for dizziness, tremors, seizures, syncope, facial asymmetry, speech difficulty, weakness, light-headedness, numbness and headaches.  Hematological: Negative for adenopathy. Does not bruise/bleed easily.  Psychiatric/Behavioral: Negative.        Objective:   Physical Exam  Vitals reviewed. Constitutional: She is oriented to person, place, and time. She appears well-developed and well-nourished. No distress.  HENT:  Head: Normocephalic and atraumatic.  Mouth/Throat: Oropharynx is clear and moist. No oropharyngeal exudate.  Eyes: Conjunctivae are normal. Right eye exhibits no discharge. Left eye exhibits no discharge. No scleral icterus.  Neck: Normal range of motion. Neck supple. No JVD present. No tracheal deviation present. No thyromegaly present.  Cardiovascular: Normal rate, regular rhythm, normal heart sounds and intact distal pulses.  Exam reveals no gallop and no friction rub.   No murmur heard. Pulmonary/Chest: Effort normal and breath sounds normal. No stridor. No respiratory distress. She has no wheezes. She has no rales. She exhibits no tenderness.  Abdominal: Soft. Bowel sounds are normal. She exhibits no  distension and no mass. There is no tenderness. There is no rebound and no guarding.  Musculoskeletal: She  exhibits no edema and no tenderness.       Cervical back: Normal. She exhibits normal range of motion, no tenderness, no bony tenderness, no swelling, no edema, no deformity, no laceration, no pain, no spasm and normal pulse.  Lymphadenopathy:    She has no cervical adenopathy.  Neurological: She is alert and oriented to person, place, and time. She has normal strength. She displays no atrophy and normal reflexes. No cranial nerve deficit or sensory deficit. She displays a negative Romberg sign. Coordination and gait normal. She displays no Babinski's sign on the right side. She displays no Babinski's sign on the left side.  Reflex Scores:      Tricep reflexes are 1+ on the right side and 1+ on the left side.      Bicep reflexes are 1+ on the right side and 1+ on the left side.      Brachioradialis reflexes are 1+ on the right side and 1+ on the left side.      Patellar reflexes are 1+ on the right side and 1+ on the left side.      Achilles reflexes are 1+ on the right side. Skin: Skin is warm and dry. No rash noted. She is not diaphoretic. No erythema. No pallor.  Psychiatric: She has a normal mood and affect. Her behavior is normal. Judgment and thought content normal.          Lab Results  Component Value Date   WBC 9.9 11/11/2008   HGB 14.3 12/27/2008   HCT 42.0 12/27/2008   PLT 268 11/11/2008   GLUCOSE 87 12/27/2008   CHOL 237* 05/09/2008   TRIG 136 05/09/2008   HDL 62 05/09/2008   LDLCALC 148* 05/09/2008   ALT 20 05/09/2008   AST 21 05/09/2008   NA 139 12/27/2008   K 4.0 12/27/2008   CL 103 12/27/2008   CREATININE 0.5 12/27/2008   BUN 13 12/27/2008   CO2 24 05/09/2008   TSH 3.656 04/14/2008   Assessment & Plan:

## 2011-03-25 NOTE — Telephone Encounter (Signed)
I do not think she is contagious anymore

## 2011-03-25 NOTE — Assessment & Plan Note (Signed)
Her BP is well controlled 

## 2011-03-25 NOTE — Telephone Encounter (Signed)
Patient notified

## 2011-03-25 NOTE — Assessment & Plan Note (Signed)
As above.

## 2011-03-25 NOTE — Telephone Encounter (Signed)
Patient called [facility] stating that she was seen today at office visit and would like to know if she is contagious and if so how long. Please advise Thanks

## 2011-03-26 ENCOUNTER — Telehealth: Payer: Self-pay

## 2011-03-26 NOTE — Telephone Encounter (Signed)
Promethazine Dm not covered by insurance. Please advise on alternative. Thanks

## 2011-04-03 ENCOUNTER — Other Ambulatory Visit (INDEPENDENT_AMBULATORY_CARE_PROVIDER_SITE_OTHER): Payer: Medicare Other

## 2011-04-03 ENCOUNTER — Telehealth: Payer: Self-pay | Admitting: *Deleted

## 2011-04-03 ENCOUNTER — Encounter: Payer: Self-pay | Admitting: Internal Medicine

## 2011-04-03 ENCOUNTER — Ambulatory Visit (INDEPENDENT_AMBULATORY_CARE_PROVIDER_SITE_OTHER): Payer: Medicare Other | Admitting: Internal Medicine

## 2011-04-03 DIAGNOSIS — M5412 Radiculopathy, cervical region: Secondary | ICD-10-CM

## 2011-04-03 DIAGNOSIS — I10 Essential (primary) hypertension: Secondary | ICD-10-CM

## 2011-04-03 DIAGNOSIS — R5383 Other fatigue: Secondary | ICD-10-CM

## 2011-04-03 DIAGNOSIS — R5381 Other malaise: Secondary | ICD-10-CM

## 2011-04-03 LAB — TSH: TSH: 2.48 u[IU]/mL (ref 0.35–5.50)

## 2011-04-03 LAB — LIPID PANEL
Cholesterol: 206 mg/dL — ABNORMAL HIGH (ref 0–200)
HDL: 50 mg/dL (ref >39.00)
Total CHOL/HDL Ratio: 4
Triglycerides: 118 mg/dL (ref 0.0–149.0)
VLDL: 23.6 mg/dL (ref 0.0–40.0)

## 2011-04-03 LAB — HEPATIC FUNCTION PANEL
ALT: 31 U/L (ref 0–35)
AST: 25 U/L (ref 0–37)
Albumin: 3.9 g/dL (ref 3.5–5.2)
Alkaline Phosphatase: 34 U/L — ABNORMAL LOW (ref 39–117)
Bilirubin, Direct: 0.1 mg/dL (ref 0.0–0.3)
Total Bilirubin: 0.5 mg/dL (ref 0.3–1.2)
Total Protein: 7.5 g/dL (ref 6.0–8.3)

## 2011-04-03 LAB — CBC WITH DIFFERENTIAL/PLATELET
Basophils Absolute: 0.1 10*3/uL (ref 0.0–0.1)
Basophils Relative: 0.5 % (ref 0.0–3.0)
Eosinophils Absolute: 0.1 10*3/uL (ref 0.0–0.7)
Eosinophils Relative: 1 % (ref 0.0–5.0)
HCT: 44.4 % (ref 36.0–46.0)
Hemoglobin: 15.2 g/dL — ABNORMAL HIGH (ref 12.0–15.0)
Lymphocytes Relative: 22.6 % (ref 12.0–46.0)
Lymphs Abs: 2.5 10*3/uL (ref 0.7–4.0)
MCHC: 34.2 g/dL (ref 30.0–36.0)
MCV: 86.6 fl (ref 78.0–100.0)
Monocytes Absolute: 0.8 10*3/uL (ref 0.1–1.0)
Monocytes Relative: 6.9 % (ref 3.0–12.0)
Neutro Abs: 7.7 10*3/uL (ref 1.4–7.7)
Neutrophils Relative %: 69 % (ref 43.0–77.0)
Platelets: 288 10*3/uL (ref 150.0–400.0)
RBC: 5.13 Mil/uL — ABNORMAL HIGH (ref 3.87–5.11)
RDW: 15.3 % — ABNORMAL HIGH (ref 11.5–14.6)
WBC: 11.2 10*3/uL — ABNORMAL HIGH (ref 4.5–10.5)

## 2011-04-03 LAB — BASIC METABOLIC PANEL
BUN: 13 mg/dL (ref 6–23)
CO2: 29 mEq/L (ref 19–32)
Calcium: 9.1 mg/dL (ref 8.4–10.5)
Chloride: 104 mEq/L (ref 96–112)
Creatinine, Ser: 0.7 mg/dL (ref 0.4–1.2)
GFR: 104.37 mL/min (ref >60.00)
Glucose, Bld: 98 mg/dL (ref 70–99)
Potassium: 4 mEq/L (ref 3.5–5.1)
Sodium: 140 mEq/L (ref 135–145)

## 2011-04-03 LAB — LDL CHOLESTEROL, DIRECT: Direct LDL: 143.8 mg/dL

## 2011-04-03 MED ORDER — NYSTATIN 100000 UNIT/ML MT SUSP: 500000.0000 [IU] | Freq: Four times a day (QID) | OROMUCOSAL | Status: DC

## 2011-04-03 NOTE — Progress Notes (Signed)
Subjective:    Patient ID: [name], female    DOB: [birth year], 46 y.o.   MRN: [id number]  HPI here for follow up -  reviewed chronic medical issues:  cardiomyopathy hx-  follows with cards (hochrein) for same -  reports compliance with ongoing medical treatment and no changes in medication dose or frequency.  denies adverse side effects related to current therapy. no SOB or edema, no CP   depression/anxiety/ADHD -  follows with behav health for med mgmt of same - reports mood and behaviour have been stable without flares   HA and migraine hx -  generally controlled with lamicatal, as needed ibuprofen  no change in meds - compliant as rx'd   HTN - the patient reports compliance with medication(s) as prescribed. Denies adverse side effects -no swelling or edema, no HA or vision changes, no weakness   Also complains of thrush - recent antibiotics for bronchitis symptoms   Neck pain - seen by by partner for same 1/11 and 1/28 > approved with conservative care including muscle relaxants, ibuprofen and when necessary Percocet - complains of weakness and numbness in bilateral upper extremity - also right greater than left-sided neck pain and back pain - no falls or balance problems   Past Medical History  Diagnosis Date  . ADHD   . CARDIOMYOPATHY, PERIPARTUM, POSTPARTUM     resolved, EF 60%  . MIGRAINE HEADACHE   . OBESITY   . DEPRESSION   . Essential hypertension, benign   . CARPAL TUNNEL SYNDROME, RIGHT   . Anxiety      Review of Systems  Cardiovascular: Negative for chest pain.  Musculoskeletal: Negative for back pain.  Neurological: Negative for weakness.       Objective:   Physical Exam  BP 128/90  Pulse 95  Temp(Src) 98.2 F (36.8 C) (Oral)  Wt 288 lb (130.636 kg)  SpO2 96% Wt Readings from Last 3 Encounters:  04/03/11 288 lb (130.636 kg)  12/13/10 292 lb (132.45 kg)  07/26/10 295 lb (133.811 kg)   Constitutional: She is morbidly obese. She appears  well-developed and well-nourished. No distress. HEENT: Oropharynx with thrush. Neck: Left greater than right paravertebral neck spasm - but full range of motion and supple. No lymphadenopathy  Cardiovascular: Normal rate, regular rhythm and normal heart sounds.  No murmur heard. No BLE edema. Pulmonary/Chest: Effort normal and breath sounds normal. No respiratory distress. She has no wheezes.  MSkel; Back: full range of motion of thoracic and lumbar spine. Non tender to palpation. Negative straight leg raise. DTR's are symmetrically intact. Sensation intact in all dermatomes of the lower extremities. Full strength to manual muscle testing. patient is able to heel toe walk without difficulty and ambulates with normal gait. Neurologic: Bilaterally hand grip slightly diminished strength - 4+/5 B UE strength - CN 2-12 symmetrically intach   Lab Results  Component Value Date   WBC 9.9 11/11/2008   HGB 14.3 12/27/2008   HCT 42.0 12/27/2008   PLT 268 11/11/2008   GLUCOSE 87 12/27/2008   CHOL 237* 05/09/2008   TRIG 136 05/09/2008   HDL 62 05/09/2008   LDLCALC 148* 05/09/2008   ALT 20 05/09/2008   AST 21 05/09/2008   NA 139 12/27/2008   K 4.0 12/27/2008   CL 103 12/27/2008   CREATININE 0.5 12/27/2008   BUN 13 12/27/2008   CO2 24 05/09/2008   TSH 3.656 04/14/2008        Assessment & Plan:  Neck pain x  1 month - unresponseive to [employer] care - complains of BUE numbness - 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

## 2011-04-03 NOTE — Telephone Encounter (Signed)
Yes - agree as here - thanks [employer]

## 2011-04-03 NOTE — Patient Instructions (Signed)
It was good to see you today. We have reviewed your prior records including labs and tests today Medications reviewed, no changes at this time. Nystatin solution for thrush - Your prescription(s) have been submitted to your pharmacy. Please take as directed and contact our office if you believe you are having problem(s) with the medication(s). Test(s) ordered today. Your results will be called to you after review (48-72hours after test completion). If any changes need to be made, you will be notified at that time. we'll make referral for MRI neck and to spine specialist. Our office will contact you regarding appointment(s) once made. We'll cancel referral to pain clinic for now Please schedule followup in 6 weeks to continue review, call sooner if problems.

## 2011-04-03 NOTE — Telephone Encounter (Signed)
Pt was referred by Dr. Yetta Barre for pain management. Pt had appt this am with Dr. Felicity Coyer discuss issue. Per Dr. Felicity Coyer pt doesn't need pain management at this time, Was told to call and cx appt. Called [facility] spoke with April cx appt for 04/12/11 per md... 04/03/11@10 :01am/LMB

## 2011-04-04 ENCOUNTER — Other Ambulatory Visit: Payer: Self-pay | Admitting: Internal Medicine

## 2011-04-04 ENCOUNTER — Encounter: Payer: Self-pay | Admitting: Internal Medicine

## 2011-04-04 DIAGNOSIS — E1169 Type 2 diabetes mellitus with other specified complication: Secondary | ICD-10-CM | POA: Insufficient documentation

## 2011-04-04 DIAGNOSIS — E785 Hyperlipidemia, unspecified: Secondary | ICD-10-CM | POA: Insufficient documentation

## 2011-04-04 HISTORY — DX: Type 2 diabetes mellitus with other specified complication: E11.69

## 2011-04-04 MED ORDER — SIMVASTATIN 20 MG PO TABS: 20.0000 mg | ORAL_TABLET | Freq: Every day | ORAL | Status: DC

## 2011-04-09 ENCOUNTER — Other Ambulatory Visit: Payer: Medicare Other

## 2011-04-10 ENCOUNTER — Ambulatory Visit
Admission: RE | Admit: 2011-04-10 | Discharge: 2011-04-10 | Disposition: A | Discharge status: Home / Self Care | Payer: Medicare Other | Source: Ambulatory Visit | Attending: Internal Medicine | Admitting: Internal Medicine

## 2011-04-10 DIAGNOSIS — M5412 Radiculopathy, cervical region: Secondary | ICD-10-CM

## 2011-04-12 ENCOUNTER — Ambulatory Visit: Payer: Medicare Other | Admitting: Physical Medicine & Rehabilitation

## 2011-04-18 ENCOUNTER — Telehealth: Payer: Self-pay

## 2011-04-18 NOTE — Telephone Encounter (Signed)
Pt called stating she has an area on the back of her tongue that resembles a skin tag that is painful. Pt is unsure if this is a side effect of oral thrush she was recently treated for, please advise, does pt need OV to evaluate?

## 2011-04-19 NOTE — Telephone Encounter (Signed)
Pt advised and will call back if needed for referral to dentist or oral surgeon.

## 2011-04-19 NOTE — Telephone Encounter (Signed)
Tongue growths are not "side effects" of thrush or its treatment - i would advise OTC ora-gel for pain control and OV with dentist or oral surg if unimproved or worse in next 7-10d - thanks

## 2011-05-16 ENCOUNTER — Ambulatory Visit (INDEPENDENT_AMBULATORY_CARE_PROVIDER_SITE_OTHER): Payer: Medicare Other | Admitting: Internal Medicine

## 2011-05-16 ENCOUNTER — Encounter: Payer: Self-pay | Admitting: Internal Medicine

## 2011-05-16 VITALS — BP 142/92 | HR 86 | Temp 98.6°F | Ht 65.0 in | Wt 280.0 lb

## 2011-05-16 DIAGNOSIS — M5412 Radiculopathy, cervical region: Secondary | ICD-10-CM

## 2011-05-16 DIAGNOSIS — E785 Hyperlipidemia, unspecified: Secondary | ICD-10-CM

## 2011-05-16 DIAGNOSIS — I10 Essential (primary) hypertension: Secondary | ICD-10-CM

## 2011-05-16 MED ORDER — HYDRALAZINE HCL 10 MG PO TABS: 10.0000 mg | ORAL_TABLET | Freq: Three times a day (TID) | ORAL | Status: DC

## 2011-05-16 NOTE — Assessment & Plan Note (Signed)
Prescribed simva 03/2011 - intermittent compliance thus far Encouraged compliance by reviewing labs and goal for CM hx

## 2011-05-16 NOTE — Assessment & Plan Note (Signed)
MRI 03/2011: Mr Cervical Spine Wo Contrast   IMPRESSION: C3-4:  Non compressive disc bulge.  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  Indentation of the right side of the cord.  C5-6:  Central to right-sided osteophyte and disc herniation with caudal migration of disc material behind C6.  Indentation of the right side of the cord.  C6-7:  Shallow disc protrusion without apparent neural compression.  C7-T1:  Shallow disc protrusion without apparent neural compression.  Because of the findings at the C4-5 and C5-6, surgical consultation is suggested.  Original Report Authenticated By: Thomasenia Sales, M.D.   Has seen spine and scoli specialist (saullo) 03/2011 Prev improved s/p pred pak Pt will follow up there as needed if recurrent pain

## 2011-05-16 NOTE — Assessment & Plan Note (Signed)
BP Readings from Last 3 Encounters:  05/16/11 142/92  04/03/11 128/90  03/25/11 138/80   Needs slightly tighter control -  On max ACEI, and beta-blocker -  No higher on amlodipine due to co administration with simvastatin Add hydralazine 10 tid -  Recheck here or with cards on same in next 2-6 weeks

## 2011-05-16 NOTE — Progress Notes (Signed)
  Subjective:    Patient ID: [name], female    DOB: [birth year], 46 y.o.   MRN: [id number]  HPI here for follow up -  reviewed chronic medical issues:  Neck pain - seen by by partner for same 1/11 and 1/28 > then me 03/2011  MRI showed impingement - seen by Saullo at spine specialist for same  improved with conservative care including pred pak,  muscle relaxants, ibuprofen and prn Percocet - improved of weakness and numbness in bilateral upper extremity - also improved right greater than left-sided neck pain and back pain - no falls or balance problems  cardiomyopathy hx-  follows with cards (hochrein) for same - on beta-blocker, acei, and lasix reports compliance with ongoing medical treatment and no changes in medication dose or frequency.  denies adverse side effects related to current therapy. no shortness of breath, edema, or chest pain    depression/anxiety/ADHD -  follows with behav health for med mgmt of same - reports mood and behaviour have been stable without flares   HTN - the patient reports compliance with medication(s) as prescribed, but "too high". Denies adverse side effects -no swelling or edema, no HA or vision changes, no weakness   dyslipidemia - rx'd simva 03/2011 but intermittent compliance   Past Medical History  Diagnosis Date  . ADHD   . CARDIOMYOPATHY, PERIPARTUM, POSTPARTUM     resolved, EF 60%  . MIGRAINE HEADACHE   . OBESITY   . DEPRESSION   . Essential hypertension, benign   . CARPAL TUNNEL SYNDROME, RIGHT   . Anxiety      Review of Systems  Cardiovascular: Negative for chest pain and palpitations.  Musculoskeletal: Negative for back pain and gait problem.  Neurological: Negative for weakness and numbness.       Objective:   Physical Exam  BP 142/92  Pulse 86  Temp(Src) 98.6 F (37 C) (Oral)  Ht 5\' 5"  (1.651 m)  Wt 280 lb (127.007 kg)  BMI 46.59 kg/m2  SpO2 96% Wt Readings from Last 3 Encounters:  05/16/11 280 lb (127.007 kg)    04/03/11 288 lb (130.636 kg)  12/13/10 292 lb (132.45 kg)   Constitutional: She is morbidly obese. She appears well-developed and well-nourished. No distress. Neck: Left greater than right paravertebral neck spasm - but full range of motion and supple. No lymphadenopathy  Cardiovascular: Normal rate, regular rhythm and normal heart sounds.  No murmur heard. No BLE edema. Pulmonary/Chest: Effort normal and breath sounds normal. No respiratory distress. She has no wheezes.  Neurologic: Bilaterally hand grip slightly diminished strength - 4+/5 B UE strength - CN 2-12 symmetrically intach   Lab Results  Component Value Date   WBC 11.2* 04/03/2011   HGB 15.2* 04/03/2011   HCT 44.4 04/03/2011   PLT 288.0 04/03/2011   GLUCOSE 98 04/03/2011   CHOL 206* 04/03/2011   TRIG 118.0 04/03/2011   HDL 50.00 04/03/2011   LDLDIRECT 143.8 04/03/2011   LDLCALC 148* 05/09/2008   ALT 31 04/03/2011   AST 25 04/03/2011   NA 140 04/03/2011   K 4.0 04/03/2011   CL 104 04/03/2011   CREATININE 0.7 04/03/2011   BUN 13 04/03/2011   CO2 29 04/03/2011   TSH 2.48 04/03/2011        Assessment & Plan:   See problem list. Medications and labs reviewed today.

## 2011-05-16 NOTE — Patient Instructions (Signed)
It was good to see you today. Start hydralazine 10mg  3x/day for blood pressure - also take simvastatin for cholesterol as reviewed today Your prescription(s) have been submitted to your pharmacy. Please take as directed and contact our office if you believe you are having problem(s) with the medication(s). No other med changes - Please schedule followup in 8 weeks to recheck blood pressure , call sooner if problems. Good job on your weight loss! keep up the good work!

## 2011-06-10 ENCOUNTER — Encounter: Payer: Self-pay | Admitting: Internal Medicine

## 2011-06-10 ENCOUNTER — Telehealth: Payer: Self-pay | Admitting: Internal Medicine

## 2011-06-10 ENCOUNTER — Encounter: Payer: Self-pay | Admitting: *Deleted

## 2011-06-10 ENCOUNTER — Ambulatory Visit (INDEPENDENT_AMBULATORY_CARE_PROVIDER_SITE_OTHER): Payer: Medicare Other | Admitting: Internal Medicine

## 2011-06-10 VITALS — BP 130/82 | HR 84 | Temp 98.5°F | Ht 65.0 in | Wt 282.1 lb

## 2011-06-10 DIAGNOSIS — R059 Cough, unspecified: Secondary | ICD-10-CM

## 2011-06-10 DIAGNOSIS — F909 Attention-deficit hyperactivity disorder, unspecified type: Secondary | ICD-10-CM

## 2011-06-10 DIAGNOSIS — R05 Cough: Secondary | ICD-10-CM

## 2011-06-10 DIAGNOSIS — J309 Allergic rhinitis, unspecified: Secondary | ICD-10-CM

## 2011-06-10 MED ORDER — AMPHETAMINE-DEXTROAMPHET ER 20 MG PO CP24: 20.0000 mg | ORAL_CAPSULE | ORAL | Status: DC

## 2011-06-10 MED ORDER — LORATADINE 10 MG PO TABS
10.0000 mg | ORAL_TABLET | Freq: Every day | ORAL | Status: DC | PRN
Start: 2011-06-10 — End: 2013-05-20

## 2011-06-10 MED ORDER — PROMETHAZINE-DM 6.25-15 MG/5ML PO SYRP: 5.0000 mL | ORAL_SOLUTION | Freq: Four times a day (QID) | ORAL | Status: DC | PRN

## 2011-06-10 MED ORDER — BENZONATATE 200 MG PO CAPS: 200.0000 mg | ORAL_CAPSULE | Freq: Three times a day (TID) | ORAL | Status: AC | PRN

## 2011-06-10 NOTE — Assessment & Plan Note (Signed)
Refill on adderall today - Cleared by cards to continue same fall 2012 - BP ok

## 2011-06-10 NOTE — Telephone Encounter (Signed)
Pt left msg on vm stating insurance want cover rx's that was sent in. Tessalon Pearls is $44 & cough syrup is $25, and pt states she can't afford. Requesting md to change to something else... 06/10/11@3 :39pm/LMB

## 2011-06-10 NOTE — Telephone Encounter (Signed)
Nothing rx is more affordable - try OTC Robitussin DM 4x/day instead (generic store brand ok)

## 2011-06-10 NOTE — Progress Notes (Signed)
  Subjective:    Patient ID: [name], female    DOB: 07-14-1965, 46 y.o.   MRN: [id number]  Cough This is a new problem. The current episode started 1 to 4 weeks ago. The problem has been waxing and waning. The problem occurs every few minutes. The cough is non-productive. Associated symptoms include nasal congestion, postnasal drip and a sore throat. Pertinent negatives include no chest pain, chills, heartburn, hemoptysis, shortness of breath or wheezing. The symptoms are aggravated by pollens. She has tried OTC cough suppressant for the symptoms.   Past Medical History  Diagnosis Date  . ADHD   . CARDIOMYOPATHY, PERIPARTUM, POSTPARTUM     resolved, EF 60%  . MIGRAINE HEADACHE   . OBESITY   . DEPRESSION   . Essential hypertension, benign   . CARPAL TUNNEL SYNDROME, RIGHT   . Anxiety      Review of Systems  Constitutional: Negative for chills.  HENT: Positive for sore throat and postnasal drip.   Respiratory: Positive for cough. Negative for hemoptysis, shortness of breath and wheezing.   Cardiovascular: Negative for chest pain.  Gastrointestinal: Negative for heartburn.       Objective:   Physical Exam BP 130/82  Pulse 84  Temp(Src) 98.5 F (36.9 C) (Oral)  Ht 5\' 5"  (1.651 m)  Wt 282 lb 1.9 oz (127.969 kg)  BMI 46.95 kg/m2  SpO2 97% Wt Readings from Last 3 Encounters:  06/10/11 282 lb 1.9 oz (127.969 kg)  05/16/11 280 lb (127.007 kg)  04/03/11 288 lb (130.636 kg)   Constitutional: She appears well-developed and well-nourished. No distress.  HENT: Head: Normocephalic and atraumatic. Nontender sinus to palpation Ears: B TMs ok, no erythema or effusion; Nose: Nose normal. Mouth/Throat: Oropharynx is red but no oropharyngeal exudate.  cobblestoning with PND Eyes: Conjunctivae and EOM are normal. Pupils are equal, round, and reactive to light. No scleral icterus.  Neck: Normal range of motion. Neck supple. No JVD present. No thyromegaly present.  Cardiovascular:  Normal rate, regular rhythm and normal heart sounds.  No murmur heard. No BLE edema. Pulmonary/Chest: Dry hacking cough with conversational effort. Effort normal and breath sounds normal. No respiratory distress. She has no wheezes. Psychiatric: She has a normal mood and affect. Her behavior is normal. Judgment and thought content normal.    Lab Results  Component Value Date   WBC 11.2* 04/03/2011   HGB 15.2* 04/03/2011   HCT 44.4 04/03/2011   PLT 288.0 04/03/2011   GLUCOSE 98 04/03/2011   CHOL 206* 04/03/2011   TRIG 118.0 04/03/2011   HDL 50.00 04/03/2011   LDLDIRECT 143.8 04/03/2011   LDLCALC 148* 05/09/2008   ALT 31 04/03/2011   AST 25 04/03/2011   NA 140 04/03/2011   K 4.0 04/03/2011   CL 104 04/03/2011   CREATININE 0.7 04/03/2011   BUN 13 04/03/2011   CO2 29 04/03/2011   TSH 2.48 04/03/2011       Assessment & Plan:  Cough - post nasal drip Suspect component of allergic rhinitis   Add antihistamine and cough suppression - ex done No role for abx reviewed - pt to call if worse of unimproved  Also requests jury excuse note - generated same today

## 2011-06-10 NOTE — Patient Instructions (Signed)
It was good to see you today. If you develop worsening symptoms or fever, call and we can reconsider antibiotics, but it does not appear necessary to use antibiotics at this time. Start Claritin once daily for next 2 weeks and use promethazine DM or Tessalon for cough symptoms - Your prescription(s) have been submitted to your pharmacy. Please take as directed and contact our office if you believe you are having problem(s) with the medication(s). Use salt water gargle for throat and Tylenol as needed  Payton Mccallum excuse note provided today as requested Refill on Adderall as requested

## 2011-06-11 NOTE — Telephone Encounter (Signed)
Notified pt with md recommendations.. 06/11/11@9 :21am/LMB

## 2011-06-12 ENCOUNTER — Telehealth: Payer: Self-pay | Admitting: *Deleted

## 2011-06-12 NOTE — Telephone Encounter (Signed)
Notified pt with md response.. 06/12/11@2 :27pm/LMB

## 2011-06-12 NOTE — Telephone Encounter (Signed)
Pt states she want to double check with md on cough syrup she recommend. Pharmacist states that the Robitussin DM will raise her BP & she is already hypertensive.Marland KitchenMarland KitchenMarland Kitchen4/17/13@2 :02pm/LMB

## 2011-06-12 NOTE — Telephone Encounter (Signed)
Robitussin DM is ok for pt with hypertension  -

## 2011-06-14 ENCOUNTER — Other Ambulatory Visit: Payer: Self-pay | Admitting: Cardiology

## 2011-06-14 NOTE — Telephone Encounter (Signed)
..   Requested Prescriptions   Pending Prescriptions Disp Refills  . enalapril (VASOTEC) 20 MG tablet [Pharmacy Med Name: ENALAPRIL MALEATE 20 MG TAB] 180 tablet 0    Sig: TAKE 1 TABLET TWICE A DAY

## 2011-06-18 ENCOUNTER — Ambulatory Visit (INDEPENDENT_AMBULATORY_CARE_PROVIDER_SITE_OTHER): Payer: Medicare Other | Admitting: Cardiology

## 2011-06-18 ENCOUNTER — Encounter: Payer: Self-pay | Admitting: *Deleted

## 2011-06-18 ENCOUNTER — Encounter: Payer: Self-pay | Admitting: Cardiology

## 2011-06-18 DIAGNOSIS — F3289 Other specified depressive episodes: Secondary | ICD-10-CM

## 2011-06-18 DIAGNOSIS — E669 Obesity, unspecified: Secondary | ICD-10-CM

## 2011-06-18 DIAGNOSIS — O903 Peripartum cardiomyopathy: Secondary | ICD-10-CM

## 2011-06-18 DIAGNOSIS — I10 Essential (primary) hypertension: Secondary | ICD-10-CM

## 2011-06-18 DIAGNOSIS — I428 Other cardiomyopathies: Secondary | ICD-10-CM

## 2011-06-18 DIAGNOSIS — F329 Major depressive disorder, single episode, unspecified: Secondary | ICD-10-CM

## 2011-06-18 DIAGNOSIS — M542 Cervicalgia: Secondary | ICD-10-CM

## 2011-06-18 MED ORDER — HYDRALAZINE HCL 10 MG PO TABS: 10.0000 mg | ORAL_TABLET | Freq: Three times a day (TID) | ORAL | Status: DC

## 2011-06-18 MED ORDER — CARVEDILOL 25 MG PO TABS: 25.0000 mg | ORAL_TABLET | Freq: Two times a day (BID) | ORAL | Status: DC

## 2011-06-18 MED ORDER — ENALAPRIL MALEATE 20 MG PO TABS: 20.0000 mg | ORAL_TABLET | Freq: Two times a day (BID) | ORAL | Status: DC

## 2011-06-18 MED ORDER — FUROSEMIDE 20 MG PO TABS: 20.0000 mg | ORAL_TABLET | Freq: Every day | ORAL | Status: DC

## 2011-06-18 MED ORDER — SIMVASTATIN 20 MG PO TABS: 20.0000 mg | ORAL_TABLET | Freq: Every day | ORAL | Status: DC

## 2011-06-18 MED ORDER — POTASSIUM CHLORIDE CRYS ER 10 MEQ PO TBCR: 10.0000 meq | EXTENDED_RELEASE_TABLET | Freq: Every day | ORAL | Status: DC

## 2011-06-18 MED ORDER — CARVEDILOL 6.25 MG PO TABS: 6.2500 mg | ORAL_TABLET | Freq: Two times a day (BID) | ORAL | Status: DC

## 2011-06-18 MED ORDER — AMLODIPINE BESYLATE 5 MG PO TABS: 5.0000 mg | ORAL_TABLET | Freq: Every day | ORAL | Status: DC

## 2011-06-18 NOTE — Assessment & Plan Note (Signed)
I reviewed her medications with her.  She will follow with her spine doctors.

## 2011-06-18 NOTE — Assessment & Plan Note (Signed)
She has canceled recent echoes and I would like to get another baseline ejection fraction and so I will reschedule an echocardiogram. She will continue her medications.

## 2011-06-18 NOTE — Assessment & Plan Note (Signed)
We discussed her life stresses and in particular her ADHD son.  (Greater than 40 minutes with this appt. With 75% face to face time.)

## 2011-06-18 NOTE — Patient Instructions (Signed)
The current medical regimen is effective;  continue present plan and medications.  Your physician has requested that you have an echocardiogram. Echocardiography is a painless test that uses sound waves to create images of your heart. It provides your doctor with information about the size and shape of your heart and how well your heart's chambers and valves are working. This procedure takes approximately one hour. There are no restrictions for this procedure.  Follow up in 1 year with Dr Hochrein.  You will receive a letter in the mail 2 months before you are due.  Please call us when you receive this letter to schedule your follow up appointment.  

## 2011-06-18 NOTE — Assessment & Plan Note (Signed)
Her blood pressure is too high today but she will be several blood pressure readings over the last few months and they were normal. She laments taking so many medications but I convinced her that this is necessary.

## 2011-06-18 NOTE — Progress Notes (Signed)
HPI The patient presents for followup of her previous cardiomyopathy and hypertension. Since last saw her she has had no new cardiovascular complaints. She denies any chest pressure, neck or arm discomfort. She's had no new palpitations, presyncope or syncope. She has some chronic dyspnea but no new complaints. Her biggest issue continues to be emotional stress.    Allergies  Allergen Reactions  . Duloxetine     REACTION: Sucidal thoughts  . Penicillins     REACTION: rash    Current Outpatient Prescriptions  Medication Sig Dispense Refill  . amLODipine (NORVASC) 5 MG tablet Take 1 tablet (5 mg total) by mouth daily.  90 tablet  3  . amphetamine-dextroamphetamine (ADDERALL XR) 20 MG 24 hr capsule Take 1 capsule (20 mg total) by mouth every morning.  30 capsule  0  . carvedilol (COREG) 25 MG tablet Take 1 tablet (25 mg total) by mouth 2 (two) times daily with a meal. PT TAKES BOTH 6.25 mg bid and 25mg  bid  180 tablet  3  . carvedilol (COREG) 6.25 MG tablet Take 1 tablet (6.25 mg total) by mouth 2 (two) times daily with a meal.  180 tablet  3  . clonazePAM (KLONOPIN) 1 MG tablet Take 1 mg by mouth 2 (two) times daily as needed.        . enalapril (VASOTEC) 20 MG tablet Take 1 tablet (20 mg total) by mouth 2 (two) times daily.  180 tablet  3  . furosemide (LASIX) 20 MG tablet Take 1 tablet (20 mg total) by mouth daily.  90 tablet  3  . gabapentin (NEURONTIN) 300 MG capsule Take 300 mg by mouth 3 (three) times daily.      . hydrALAZINE (APRESOLINE) 10 MG tablet Take 1 tablet (10 mg total) by mouth 3 (three) times daily.  90 tablet  1  . ibuprofen (ADVIL,MOTRIN) 800 MG tablet Take 1 tablet (800 mg total) by mouth every 8 (eight) hours as needed.  120 tablet  1  . loratadine (CLARITIN) 10 MG tablet Take 1 tablet (10 mg total) by mouth daily as needed for allergies.  30 tablet  2  . methocarbamol (ROBAXIN) 750 MG tablet Take 750 mg by mouth 4 (four) times daily as needed.      . Multiple  Vitamins-Minerals CAPS Take 1 capsule by mouth daily.  90 capsule  3  . oxyCODONE-acetaminophen (ROXICET) 5-325 MG/5ML solution Take by mouth every 4 (four) hours as needed.      . potassium chloride (KLOR-CON M10) 10 MEQ tablet Take 1 tablet (10 mEq total) by mouth daily.  90 tablet  3  . simvastatin (ZOCOR) 20 MG tablet Take 1 tablet (20 mg total) by mouth at bedtime.  90 tablet  3  . benzonatate (TESSALON) 200 MG capsule Take 1 capsule (200 mg total) by mouth 3 (three) times daily as needed for cough.  30 capsule  1  . DISCONTD: enalapril (VASOTEC) 20 MG tablet TAKE 1 TABLET TWICE A DAY  180 tablet  0    Past Medical History  Diagnosis Date  . ADHD   . CARDIOMYOPATHY, PERIPARTUM, POSTPARTUM     resolved, EF 60%  . MIGRAINE HEADACHE   . OBESITY   . DEPRESSION   . Essential hypertension, benign   . CARPAL TUNNEL SYNDROME, RIGHT   . Anxiety     Past Surgical History  Procedure Date  . Gastric bypass   . Child birth 2003  . Cardiomyopathy 2003   ROS:  Spine pain.  Otherwise as stated in the HPI and negative for all other systems.  PHYSICAL EXAM BP 150/100  Pulse 86  Ht 5\' 4"  (1.626 m)  Wt 279 lb 1.9 oz (126.608 kg)  BMI 47.91 kg/m2 GENERAL:  Well appearing HEENT:  Pupils equal round and reactive, fundi not visualized, oral mucosa unremarkable NECK:  No jugular venous distention, waveform within normal limits, carotid upstroke brisk and symmetric, no bruits, no thyromegaly LYMPHATICS:  No cervical, inguinal adenopathy LUNGS:  Clear to auscultation bilaterally BACK:  No CVA tenderness CHEST:  Unremarkable HEART:  PMI not displaced or sustained,S1 and S2 within normal limits, no S3, no S4, no clicks, no rubs, no murmurs ABD:  Flat, positive bowel sounds normal in frequency in pitch, no bruits, no rebound, no guarding, no midline pulsatile mass, no hepatomegaly, no splenomegaly, obese EXT:  2 plus pulses throughout, no edema, no cyanosis no clubbing NEURO:  Cranial nerves II  through XII grossly intact, motor grossly intact throughout PSYCH:  Cognitively intact, oriented to person place and time, tearful  EKG:  Sinus rhythm, rate 86, LAD, intervals within normal limits, no acute ST-T wave changes. 06/18/2011  ASSESSMENT AND PLAN

## 2011-06-18 NOTE — Assessment & Plan Note (Signed)
The patient understands the need to lose weight with diet and exercise. We have discussed specific strategies for this.  

## 2011-06-27 ENCOUNTER — Ambulatory Visit (HOSPITAL_COMMUNITY): Payer: Medicare Other | Attending: Cardiology

## 2011-06-27 ENCOUNTER — Other Ambulatory Visit: Payer: Self-pay

## 2011-06-27 DIAGNOSIS — I10 Essential (primary) hypertension: Secondary | ICD-10-CM | POA: Insufficient documentation

## 2011-06-27 DIAGNOSIS — E669 Obesity, unspecified: Secondary | ICD-10-CM | POA: Insufficient documentation

## 2011-06-27 DIAGNOSIS — I428 Other cardiomyopathies: Secondary | ICD-10-CM | POA: Insufficient documentation

## 2011-07-18 ENCOUNTER — Ambulatory Visit: Payer: Medicare Other | Admitting: Internal Medicine

## 2011-08-19 ENCOUNTER — Other Ambulatory Visit: Payer: Self-pay | Admitting: Internal Medicine

## 2011-09-11 NOTE — Telephone Encounter (Signed)
error 

## 2011-10-07 ENCOUNTER — Telehealth: Payer: Self-pay | Admitting: Cardiology

## 2011-10-07 NOTE — Telephone Encounter (Signed)
Patient called because she said has been diagnose recently with CHF. She travel to [place] on July 17 th via car her LE got edematous. Pt  is back in [place] now , the edema is worse. Sometimes the swelling is better other times is worse even when  she put her feet up. Patient said her feet and ankle are tight, painful and burning, she is not able to wear shoes, also she is not able to see her ankles because of the swelling. She denies SOB. Patient was offered an appointment with the PA, when Dr. [name] is in the office this week because MD does not have any openings on his schedule until 11/05/11, pt refused. She  does not want to see any one else. Patient would like to see if MD agreed to [facility] his schedule.

## 2011-10-07 NOTE — Telephone Encounter (Signed)
New msg Pt has noticed some swelling of lower legs and wanted to talk to you

## 2011-10-08 NOTE — Telephone Encounter (Signed)
The patient will need to be seen by an extender if I have no openings.  She can be scheduled on a day that I am in the office.

## 2011-10-09 NOTE — Telephone Encounter (Signed)
Called the patient about coming in on 10/10/2011 to see [name] while Dr [name] was here and she did not want to. She also stated that she was a sleep and was not quite with it, and that she would call the office back tomorrow

## 2011-10-22 ENCOUNTER — Telehealth: Payer: Self-pay | Admitting: Cardiology

## 2011-10-22 NOTE — Telephone Encounter (Signed)
Pt states she has been having bilateral edema and leg pain for over one month now but she has been out of town.  She is highly concerned and wants to be seen.  She does not want to see any one but Dr [name].  Pt given a work in appt for Friday at 12:15 pm

## 2011-10-22 NOTE — Telephone Encounter (Signed)
New Problem:    Patient called in wanting to speak with you because she wants to be scheduled to see Dr. [name] within the next two weeks while she is in town, because her both of her legs (up to her knee) and feet have been swollen for the past month and a half.  Patient states that her legs and feet are in pain, like the skin is going to tear and feels like they are burning.  Patient states that her breathing is fine and does not complain of any other issues.  Patient declined to see a PA/NP when she called the last time and this time.  Patient declined to wait until 11/13/11 to see Dr. [name].  Patient does not prefer to see other physicians that are not familiar with her case.  Please call back.

## 2011-10-25 ENCOUNTER — Ambulatory Visit (INDEPENDENT_AMBULATORY_CARE_PROVIDER_SITE_OTHER): Payer: Medicare Other | Admitting: Cardiology

## 2011-10-25 ENCOUNTER — Encounter: Payer: Self-pay | Admitting: Cardiology

## 2011-10-25 VITALS — BP 138/90 | HR 88 | Ht 64.0 in | Wt 293.1 lb

## 2011-10-25 DIAGNOSIS — R0602 Shortness of breath: Secondary | ICD-10-CM

## 2011-10-25 DIAGNOSIS — I10 Essential (primary) hypertension: Secondary | ICD-10-CM

## 2011-10-25 DIAGNOSIS — O903 Peripartum cardiomyopathy: Secondary | ICD-10-CM

## 2011-10-25 DIAGNOSIS — I509 Heart failure, unspecified: Secondary | ICD-10-CM

## 2011-10-25 DIAGNOSIS — R609 Edema, unspecified: Secondary | ICD-10-CM

## 2011-10-25 DIAGNOSIS — R600 Localized edema: Secondary | ICD-10-CM

## 2011-10-25 LAB — BASIC METABOLIC PANEL
BUN: 10 mg/dL (ref 6–23)
CO2: 27 mEq/L (ref 19–32)
Calcium: 9.1 mg/dL (ref 8.4–10.5)
Chloride: 104 mEq/L (ref 96–112)
Creatinine, Ser: 0.9 mg/dL (ref 0.4–1.2)
GFR: 72.44 mL/min (ref >60.00)
Glucose, Bld: 83 mg/dL (ref 70–99)
Potassium: 3.7 mEq/L (ref 3.5–5.1)
Sodium: 138 mEq/L (ref 135–145)

## 2011-10-25 LAB — BRAIN NATRIURETIC PEPTIDE: Pro B Natriuretic peptide (BNP): 32 pg/mL (ref 0.0–100.0)

## 2011-10-25 MED ORDER — CARVEDILOL 6.25 MG PO TABS: 6.2500 mg | ORAL_TABLET | Freq: Two times a day (BID) | ORAL | Status: DC

## 2011-10-25 MED ORDER — CARVEDILOL 25 MG PO TABS: 25.0000 mg | ORAL_TABLET | Freq: Two times a day (BID) | ORAL | Status: DC

## 2011-10-25 NOTE — Patient Instructions (Addendum)
Your physician recommends that you schedule a follow-up appointment in: 1 month with Tereso Newcomer, PA  Your physician has requested that you have an echocardiogram. Echocardiography is a painless test that uses sound waves to create images of your heart. It provides your doctor with information about the size and shape of your heart and how well your heart's chambers and valves are working. This procedure takes approximately one hour. There are no restrictions for this procedure.   Your physician has requested that you have a lower or upper extremity venous duplex. This test is an ultrasound of the veins in the legs or arms. It looks at venous blood flow that carries blood from the heart to the legs or arms. Allow one hour for a Lower Venous exam. Allow thirty minutes for an Upper Venous exam. There are no restrictions or special instructions.  Your physician recommends that you return for lab work in:  5 days--BMP    Your physician has recommended you make the following change in your medication: Increase furosemide to 40 mg twice daily for 4 days then return to normal dose. Increase potassium to 20 meq daily for 4 days then return to normal dose.

## 2011-10-25 NOTE — Progress Notes (Signed)
HPI The patient presents for evaluation of edema. She said she was driving to work about 6 weeks ago and noticed increased swelling on the drive up. She had significant lower extremity edema through her entire trip though she probably be more salt as they were eating out quite a bit. However, on coming back to [place] 2 weeks ago she's continued had increased lower extremity swelling. I do note that her weight is up 14 pounds since last visit. She has had no new shortness of breath, PND or orthopnea interestingly. She's not had any chest pressure, neck or arm discomfort. She does have left greater than right lower extremity swelling with some discomfort that she's had her left leg. She has not changed her diuretic dose.   Allergies  Allergen Reactions  . Duloxetine     REACTION: Sucidal thoughts  . Penicillins     REACTION: rash    Current Outpatient Prescriptions  Medication Sig Dispense Refill  . amLODipine (NORVASC) 5 MG tablet Take 1 tablet (5 mg total) by mouth daily.  90 tablet  3  . amphetamine-dextroamphetamine (ADDERALL XR) 20 MG 24 hr capsule Take 1 capsule (20 mg total) by mouth every morning.  30 capsule  0  . carvedilol (COREG) 25 MG tablet Take 1 tablet (25 mg total) by mouth 2 (two) times daily with a meal. PT TAKES BOTH 6.25 mg bid and 25mg  bid  180 tablet  3  . carvedilol (COREG) 6.25 MG tablet Take 1 tablet (6.25 mg total) by mouth 2 (two) times daily with a meal.  180 tablet  3  . clonazePAM (KLONOPIN) 1 MG tablet Take 1 mg by mouth 2 (two) times daily as needed.        . enalapril (VASOTEC) 20 MG tablet Take 1 tablet (20 mg total) by mouth 2 (two) times daily.  180 tablet  3  . furosemide (LASIX) 20 MG tablet Take 1 tablet (20 mg total) by mouth daily.  90 tablet  3  . gabapentin (NEURONTIN) 300 MG capsule Take 300 mg by mouth as needed.       . hydrALAZINE (APRESOLINE) 10 MG tablet Take 1 tablet (10 mg total) by mouth 3 (three) times daily.  90 tablet  3  .  ibuprofen (ADVIL,MOTRIN) 800 MG tablet Take 1 tablet (800 mg total) by mouth every 8 (eight) hours as needed.  120 tablet  1  . lamoTRIgine (LAMICTAL) 200 MG tablet Take 200 mg by mouth daily.      [name] loratadine (CLARITIN) 10 MG tablet Take 1 tablet (10 mg total) by mouth daily as needed for allergies.  30 tablet  2  . methocarbamol (ROBAXIN) 750 MG tablet Take 750 mg by mouth 4 (four) times daily as needed.      [name] oxyCODONE-acetaminophen (ROXICET) 5-325 MG/5ML solution Take by mouth every 4 (four) hours as needed.      . potassium chloride (KLOR-CON M10) 10 MEQ tablet Take 1 tablet (10 mEq total) by mouth daily.  90 tablet  3  . simvastatin (ZOCOR) 20 MG tablet Take 1 tablet (20 mg total) by mouth at bedtime.  90 tablet  3  . DISCONTD: hydrALAZINE (APRESOLINE) 10 MG tablet TAKE 1 TABLET (10 MG TOTAL) BY MOUTH 3 (THREE) TIMES DAILY.  90 tablet  1    Past Medical History  Diagnosis Date  . ADHD   . CARDIOMYOPATHY, PERIPARTUM, POSTPARTUM     resolved, EF 60%  . MIGRAINE HEADACHE   .  OBESITY   . DEPRESSION   . Essential hypertension, benign   . CARPAL TUNNEL SYNDROME, RIGHT   . Anxiety     Past Surgical History  Procedure Date  . Gastric bypass   . Child birth 2003  . Cardiomyopathy 2003   ROS:  Spine pain.  Otherwise as stated in the HPI and negative for all other systems.  PHYSICAL EXAM BP 138/90  Pulse 88  Ht 5\' 4"  (1.626 m)  Wt 293 lb 1.9 oz (132.958 kg)  BMI 50.31 kg/m2 GENERAL:  Well appearing HEENT:  Pupils equal round and reactive, fundi not visualized, oral mucosa unremarkable NECK:  No jugular venous distention, waveform within normal limits, carotid upstroke brisk and symmetric, no bruits, no thyromegaly LYMPHATICS:  No cervical, inguinal adenopathy LUNGS:  Clear to auscultation bilaterally BACK:  No CVA tenderness CHEST:  Unremarkable HEART:  PMI not displaced or sustained,S1 and S2 within normal limits, no S3, no S4, no clicks, no rubs, no murmurs ABD:  Flat,  positive bowel sounds normal in frequency in pitch, no bruits, no rebound, no guarding, no midline pulsatile mass, no hepatomegaly, no splenomegaly, obese EXT:  2 plus pulses throughout, mild to moderate left greater than right lower extremityedema, no cyanosis no clubbing NEURO:  Cranial nerves II through XII grossly intact, motor grossly intact throughout PSYCH:  Cognitively intact, oriented to person place and time, tearful  EKG:  Sinus rhythm, rate 88, axis within normal limits, intervals within normal limits, no acute ST-T wave changes. 10/25/2011  ASSESSMENT AND PLAN  Edema - This may be related to increased salt intake recently. At this point I will however need to exclude DVT with lower extremity venous Dopplers. I am going to have her increase her Lasix to 40 mg twice a day for 4 days with increased potassium to 20 mEq. She'll get a basic metabolic profile today and a basic metabolic profile in 5 days. She'll restrict her salt and keep her feet elevated.  CARDIOMYOPATHY, PERIPARTUM, POSTPARTUM -  She says that this is reminiscent of her previous acute heart failure though it is atypical. She'll get a BNP and a repeat echocardiogram.   ESSENTIAL HYPERTENSION, BENIGN -  The blood pressure is at target. No change in medications is indicated. We will continue with therapeutic lifestyle changes (TLC).

## 2011-10-30 ENCOUNTER — Other Ambulatory Visit (INDEPENDENT_AMBULATORY_CARE_PROVIDER_SITE_OTHER): Payer: Medicare Other

## 2011-10-30 ENCOUNTER — Encounter (INDEPENDENT_AMBULATORY_CARE_PROVIDER_SITE_OTHER): Payer: Medicare Other

## 2011-10-30 DIAGNOSIS — R0602 Shortness of breath: Secondary | ICD-10-CM

## 2011-10-30 DIAGNOSIS — O903 Peripartum cardiomyopathy: Secondary | ICD-10-CM

## 2011-10-30 DIAGNOSIS — I10 Essential (primary) hypertension: Secondary | ICD-10-CM

## 2011-10-30 DIAGNOSIS — M79609 Pain in unspecified limb: Secondary | ICD-10-CM

## 2011-10-30 DIAGNOSIS — R609 Edema, unspecified: Secondary | ICD-10-CM

## 2011-10-30 LAB — BASIC METABOLIC PANEL
BUN: 17 mg/dL (ref 6–23)
CO2: 31 mEq/L (ref 19–32)
Calcium: 9.4 mg/dL (ref 8.4–10.5)
Chloride: 101 mEq/L (ref 96–112)
Creatinine, Ser: 0.8 mg/dL (ref 0.4–1.2)
GFR: 85.62 mL/min (ref >60.00)
Glucose, Bld: 86 mg/dL (ref 70–99)
Potassium: 3.9 mEq/L (ref 3.5–5.1)
Sodium: 139 mEq/L (ref 135–145)

## 2011-11-06 ENCOUNTER — Ambulatory Visit (HOSPITAL_COMMUNITY): Payer: Medicare Other | Attending: Cardiology

## 2011-11-06 DIAGNOSIS — O903 Peripartum cardiomyopathy: Secondary | ICD-10-CM

## 2011-11-06 DIAGNOSIS — R609 Edema, unspecified: Secondary | ICD-10-CM | POA: Insufficient documentation

## 2011-11-06 DIAGNOSIS — Z87891 Personal history of nicotine dependence: Secondary | ICD-10-CM | POA: Insufficient documentation

## 2011-11-06 DIAGNOSIS — I428 Other cardiomyopathies: Secondary | ICD-10-CM

## 2011-11-06 DIAGNOSIS — I10 Essential (primary) hypertension: Secondary | ICD-10-CM | POA: Insufficient documentation

## 2011-11-06 NOTE — Progress Notes (Signed)
Echocardiogram performed.  

## 2011-11-13 ENCOUNTER — Telehealth: Payer: Self-pay | Admitting: Cardiovascular Disease

## 2011-11-13 NOTE — Telephone Encounter (Signed)
I left a message of the patient's results on her identified voice mail of her lower extremity venous ultrasound and echo.

## 2011-11-13 NOTE — Telephone Encounter (Signed)
Pt rtn call to [place] from yesterday

## 2011-11-18 ENCOUNTER — Encounter: Payer: Self-pay | Admitting: Internal Medicine

## 2011-11-18 ENCOUNTER — Ambulatory Visit (INDEPENDENT_AMBULATORY_CARE_PROVIDER_SITE_OTHER): Payer: Medicare Other | Admitting: Internal Medicine

## 2011-11-18 ENCOUNTER — Telehealth: Payer: Self-pay | Admitting: Cardiology

## 2011-11-18 VITALS — BP 140/100 | HR 96 | Temp 98.5°F | Resp 15 | Wt 301.0 lb

## 2011-11-18 DIAGNOSIS — Z23 Encounter for immunization: Secondary | ICD-10-CM

## 2011-11-18 DIAGNOSIS — I10 Essential (primary) hypertension: Secondary | ICD-10-CM

## 2011-11-18 DIAGNOSIS — R609 Edema, unspecified: Secondary | ICD-10-CM

## 2011-11-18 DIAGNOSIS — Z79899 Other long term (current) drug therapy: Secondary | ICD-10-CM

## 2011-11-18 MED ORDER — FUROSEMIDE 40 MG PO TABS: 40.0000 mg | ORAL_TABLET | Freq: Every day | ORAL | Status: DC

## 2011-11-18 MED ORDER — HYDRALAZINE HCL 25 MG PO TABS: 25.0000 mg | ORAL_TABLET | Freq: Three times a day (TID) | ORAL | Status: DC

## 2011-11-18 MED ORDER — AMPHETAMINE-DEXTROAMPHET ER 30 MG PO CP24: 30.0000 mg | ORAL_CAPSULE | ORAL | Status: DC

## 2011-11-18 MED ORDER — TRAMADOL HCL 50 MG PO TABS: 50.0000 mg | ORAL_TABLET | Freq: Three times a day (TID) | ORAL | Status: DC | PRN

## 2011-11-18 NOTE — Assessment & Plan Note (Signed)
BP Readings from Last 3 Encounters:  11/18/11 140/100  10/25/11 138/90  06/18/11 150/100   Needs slightly tighter control -  Stop amlodipine due to edema On max ACEI, and beta-blocker -  increase hydralazine to 25 tid and increase lasix due to edema Recheck here or with cards on same in next few week

## 2011-11-18 NOTE — Telephone Encounter (Signed)
Advised patient. She has an appointment scheduled for 9/30 with Bing Neighbors PA, will schedule lab check then.

## 2011-11-18 NOTE — Telephone Encounter (Signed)
New problem:  Dr. Felicity Coyer changed the dosage of furosemide . Please advise if Potassium should be changed as well.

## 2011-11-18 NOTE — Telephone Encounter (Signed)
She can continue on the current dose of potassium and get a BMET in two weeks.

## 2011-11-18 NOTE — Progress Notes (Signed)
Subjective:    Patient ID: [name], female    DOB: August 12, 1965, 46 y.o.   MRN: [id number]  HPI here for [name] edema x 6-8 weeks -  Recent evaluation with cardiology shows preserved LV and no DVT on Doppler Admits to sodium indiscretion due to dietary problems but no medication changes or NSIADS associated with "tightness" feeling and L knee pain, but no trauma recalled  Also reviewed chronic medical issues:  Neck pain, chronic - MRI 03/2011 showed impingement - seen by Saullo at spine specialist for same  improved with conservative care including pred pak,  muscle relaxants, ibuprofen and prn Percocet - improved of weakness and numbness in bilateral upper extremity - also improved right greater than left-sided neck pain and back pain - no falls or balance problems  cardiomyopathy hx- resolved by echo, normal LVEF 60% 10/2011 follows with cards (hochrein) for same - on beta-blocker, acei, and lasix reports compliance with ongoing medical treatment and no changes in medication dose or frequency.  denies adverse side effects related to current therapy. no shortness of breath, edema, or chest pain    depression/anxiety/ADHD -  follows with behav health for med mgmt of same - reports mood and behaviour have been stable without flares   HTN - the patient reports compliance with medication(s) as prescribed - ? adverse side effects -increasing edema, see above. No headache or vision changes, no weakness   dyslipidemia - prescribed simva 03/2011 but intermittent compliance   Past Medical History  Diagnosis Date  . ADHD   . CARDIOMYOPATHY, PERIPARTUM, POSTPARTUM     resolved, EF 60% echo 10/2011  . MIGRAINE HEADACHE   . OBESITY   . DEPRESSION   . Essential hypertension, benign   . CARPAL TUNNEL SYNDROME, RIGHT   . Anxiety      Review of Systems  Cardiovascular: Negative for chest pain and palpitations.  Musculoskeletal: Negative for back pain and gait problem.  Neurological:  Negative for weakness and numbness.       Objective:   Physical Exam  BP 140/100  Pulse 96  Temp 98.5 F (36.9 C) (Oral)  Resp 15  Wt 301 lb (136.533 kg)  SpO2 96% Wt Readings from Last 3 Encounters:  11/18/11 301 lb (136.533 kg)  10/25/11 293 lb 1.9 oz (132.958 kg)  06/18/11 279 lb 1.9 oz (126.608 kg)   Constitutional: She is morbidly obese. She appears well-developed and well-nourished. No distress. Neck: mild paravertebral neck spasm - but full range of motion and supple. No lymphadenopathy  Cardiovascular: Normal rate, regular rhythm and normal heart sounds.  No murmur heard. Mild [name] fullness knee down as compared to RLE, but no edema at this time - shows photos of marked L>R LE edema at end of day. Pulmonary/Chest: Effort normal and breath sounds normal. No respiratory distress. She has no wheezes.  MSekl: L knee - boggy synovitis - minimally tender to palpation over joint line; FROM and ligamentous function intact    Lab Results  Component Value Date   WBC 11.2* 04/03/2011   HGB 15.2* 04/03/2011   HCT 44.4 04/03/2011   PLT 288.0 04/03/2011   GLUCOSE 86 10/30/2011   CHOL 206* 04/03/2011   TRIG 118.0 04/03/2011   HDL 50.00 04/03/2011   LDLDIRECT 143.8 04/03/2011   LDLCALC 148* 05/09/2008   ALT 31 04/03/2011   AST 25 04/03/2011   NA 139 10/30/2011   K 3.9 10/30/2011   CL 101 10/30/2011   CREATININE 0.8 10/30/2011  BUN 17 10/30/2011   CO2 31 10/30/2011   TSH 2.48 04/03/2011       Assessment & Plan:  [name] edema - no DVT or CHF with cards eval earlier this month - suspect venous insuff exacerbated by obesity and diet/Na indiscretion - The patient is reassured that these symptoms do not appear to represent a serious or threatening condition.  Stop amlodipine (see HTN next), increase diuretic and advised compression hose in addition to weight loss and diet changes  See problem list. Medications and labs reviewed today.

## 2011-11-18 NOTE — Patient Instructions (Signed)
It was good to see you today. We have reviewed your prior records including labs and tests today Stop amlodipine and ibuprofen now due to swelling Increase furosemide to 40mg  daily Increase hydralazine to 25 mg 3x/day Use tramadol as needed for pain in place of ibuprofen Work on lifestyle changes as discussed (low fat, low carb, increased protein diet; improved exercise efforts; weight loss) to control sugar, blood pressure and cholesterol levels and/or reduce risk of developing other medical problems. Please schedule followup in 3 months to recheck blood pressure and weight, call sooner if problems.

## 2011-11-18 NOTE — Telephone Encounter (Signed)
Patient saw Dr Felicity Coyer today and she increased her Lasix to 40 mg (from 20 mg) daily.  Patient is concerned that her potassium 10 meq daily was not increased. Per patient she discussed with the nurse and she asked Dr Felicity Coyer and was told to continue same dose.  Patients last potassium was 3.9 on 10/30/11.  Patient wanted for Dr [name] to be aware of Lasix change and his opinion on the potassium.  Advised he was out of the office but would forward to him and Orlie Dakin RN for review. Did advise patient to increase potassium intake with her food for now.

## 2011-11-21 ENCOUNTER — Telehealth: Payer: Self-pay | Admitting: *Deleted

## 2011-11-21 DIAGNOSIS — M25569 Pain in unspecified knee: Secondary | ICD-10-CM

## 2011-11-21 NOTE — Telephone Encounter (Signed)
Pt states that she was recently switched to Tramadol for pain medication and it is not helping. Pt wants to know if dosage can be increased or if she can be switched to an alternative medication. Please advise.

## 2011-11-22 MED ORDER — TRAMADOL HCL 50 MG PO TABS: 50.0000 mg | ORAL_TABLET | Freq: Four times a day (QID) | ORAL | Status: DC | PRN

## 2011-11-22 NOTE — Telephone Encounter (Signed)
May increase tramadol dose to 1 or 2 tablets every 6 hours as needed for pain, max 8 tablets in 24 hours

## 2011-11-22 NOTE — Telephone Encounter (Signed)
Left message for pt to callback office.  

## 2011-11-25 ENCOUNTER — Other Ambulatory Visit (INDEPENDENT_AMBULATORY_CARE_PROVIDER_SITE_OTHER): Payer: Medicare Other

## 2011-11-25 ENCOUNTER — Encounter: Payer: Self-pay | Admitting: Physician Assistant

## 2011-11-25 ENCOUNTER — Telehealth: Payer: Self-pay | Admitting: Cardiology

## 2011-11-25 ENCOUNTER — Ambulatory Visit (INDEPENDENT_AMBULATORY_CARE_PROVIDER_SITE_OTHER): Payer: Medicare Other | Admitting: Physician Assistant

## 2011-11-25 VITALS — BP 133/82 | HR 96 | Resp 18 | Ht 64.0 in | Wt 297.0 lb

## 2011-11-25 DIAGNOSIS — Z79899 Other long term (current) drug therapy: Secondary | ICD-10-CM

## 2011-11-25 DIAGNOSIS — I10 Essential (primary) hypertension: Secondary | ICD-10-CM

## 2011-11-25 DIAGNOSIS — R609 Edema, unspecified: Secondary | ICD-10-CM

## 2011-11-25 DIAGNOSIS — O903 Peripartum cardiomyopathy: Secondary | ICD-10-CM

## 2011-11-25 LAB — BASIC METABOLIC PANEL
BUN: 17 mg/dL (ref 6–23)
CO2: 28 mEq/L (ref 19–32)
Calcium: 9.2 mg/dL (ref 8.4–10.5)
Chloride: 103 mEq/L (ref 96–112)
Creatinine, Ser: 0.7 mg/dL (ref 0.4–1.2)
GFR: 95.54 mL/min (ref >60.00)
Glucose, Bld: 87 mg/dL (ref 70–99)
Potassium: 4.1 mEq/L (ref 3.5–5.1)
Sodium: 138 mEq/L (ref 135–145)

## 2011-11-25 NOTE — Telephone Encounter (Signed)
New  Problem:  Seen today by Tereso Newcomer . need procedure code for ted hose stocking

## 2011-11-25 NOTE — Patient Instructions (Signed)
Your physician recommends that you have lab work today: bmp  You have been given an order for compression stockings to be worn. If the prescription version is not covered for you, then you may try an over the counter support sock.  Your physician recommends that you continue on your current medications as directed. Please refer to the Current Medication list given to you today.  Your physician wants you to follow-up in: 6 months with Dr. [name]. You will receive a reminder letter in the mail two months in advance. If you don't receive a letter, please call our office to schedule the follow-up appointment.

## 2011-11-25 NOTE — Telephone Encounter (Signed)
Pt informed of MD's advisement. Pt wants to know if Tramadol can be used for HA, or if she needs to take something else for her HA if she has one (pt would take Ibuprofen for HA and knee pain but MD told her to d/c).

## 2011-11-25 NOTE — Telephone Encounter (Signed)
Per Charmaine/ [name] in the coding/billing department- the CPT code for knee high support stockings at 30-40 mmHG is A6531. This has been given to the patient as she states medicare will not cover these, but medicare may. They were asking for the CPT code.

## 2011-11-25 NOTE — Progress Notes (Signed)
[address] [place], [place]  [address] Phone: [phone] Fax:  [phone]  Date:  11/25/2011   Name:  [name]   DOB:  11/07/1965   MRN:  [id number]  PCP:  Rene Paci, MD  Primary Cardiologist:  Dr. [name]  Primary Electrophysiologist:  None    History of Present Illness: [name] is a 46 y.o. female who returns for followup on edema.  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 Her ejection fraction was previously 30-40%. This has returned to normal. She was recently seen in followup by Dr. [name] 10/25/11. She had increased lower extremity edema. She was given several days of Lasix and asked to keep her legs elevated. She was set up for followup echocardiogram as well as LE venous Dopplers. Echocardiogram demonstrated stable LV function with an ejection fraction of 50%. Venous Dopplers were negative for DVT.  Since being, she has seen her PCP. Her Norvasc was discontinued. Her hydralazine was adjusted for blood pressure. She was also asked to increase her Lasix. She continues to have LAD edema. This is worse on the left. Of note, she has left knee pain. She just had her pain medications adjusted by her PCP. She denies significant changes in her dyspnea. She denies orthopnea, PND. She denies chest pain. She denies syncope.   Labs (2/13): K 4, creatinine 0.7, ALT 31, LDL 143.8, Hgb 15.2, TSH 2.48 Labs (8/13): K 3.7, creatinine 0.9 Labs (9/13): K 3.9, creatinine 0.8   Wt Readings from Last 3 Encounters:  11/25/11 297 lb (134.718 kg)  11/18/11 301 lb (136.533 kg)  10/25/11 293 lb 1.9 oz (132.958 kg)     Past Medical History  Diagnosis Date  . ADHD   . CARDIOMYOPATHY, PERIPARTUM, POSTPARTUM     a. EF previously 30-40%,  b.  resolved, EF 60%    c.  echo 10/2011:  mild LVH, EF 50%, mild LAE,   . MIGRAINE HEADACHE   . OBESITY   . DEPRESSION   . Essential hypertension, benign   . CARPAL TUNNEL SYNDROME, RIGHT    . Anxiety   . Edema     a. LE venous dopplers negative for DVT 10/2011    Current Outpatient Prescriptions  Medication Sig Dispense Refill  . amphetamine-dextroamphetamine (ADDERALL XR) 30 MG 24 hr capsule Take 1 capsule (30 mg total) by mouth every morning.  30 capsule  0  . carvedilol (COREG) 25 MG tablet Take 1 tablet (25 mg total) by mouth 2 (two) times daily with a meal. PT TAKES BOTH 6.25 mg bid and 25mg  bid  180 tablet  3  . carvedilol (COREG) 6.25 MG tablet Take 1 tablet (6.25 mg total) by mouth 2 (two) times daily with a meal.  180 tablet  3  . clonazePAM (KLONOPIN) 1 MG tablet Take 1 mg by mouth 2 (two) times daily as needed.        . enalapril (VASOTEC) 20 MG tablet Take 1 tablet (20 mg total) by mouth 2 (two) times daily.  180 tablet  3  . furosemide (LASIX) 40 MG tablet Take 1 tablet (40 mg total) by mouth daily.  90 tablet  3  . gabapentin (NEURONTIN) 300 MG capsule Take 300 mg by mouth as needed.       . hydrALAZINE (APRESOLINE) 25 MG tablet Take 1 tablet (25 mg total) by mouth 3 (three) times daily.  90 tablet  3  . lamoTRIgine (LAMICTAL) 200 MG  tablet Take 200 mg by mouth daily.      [name] loratadine (CLARITIN) 10 MG tablet Take 1 tablet (10 mg total) by mouth daily as needed for allergies.  30 tablet  2  . methocarbamol (ROBAXIN) 750 MG tablet Take 750 mg by mouth 4 (four) times daily as needed.      . potassium chloride (KLOR-CON M10) 10 MEQ tablet Take 1 tablet (10 mEq total) by mouth daily.  90 tablet  3  . simvastatin (ZOCOR) 20 MG tablet Take 1 tablet (20 mg total) by mouth at bedtime.  90 tablet  3  . traMADol (ULTRAM) 50 MG tablet Take 1-2 tablets (50-100 mg total) by mouth every 6 (six) hours as needed for pain.  60 tablet  0    Allergies: Allergies  Allergen Reactions  . Duloxetine     REACTION: Sucidal thoughts  . Penicillins     REACTION: rash    History  Substance Use Topics  . Smoking status: Former Smoker    Quit date: 02/25/1997  . Smokeless tobacco:  Not on file   Comment: Married, lives with spouse (when he is not traveling) and son  . Alcohol Use: No     PHYSICAL EXAM: VS:  BP 133/82  Pulse 96  Resp 18  Ht 5\' 4"  (1.626 m)  Wt 297 lb (134.718 kg)  BMI 50.98 kg/m2  SpO2 93% Well nourished, well developed, in no acute distress HEENT: normal Neck: no JVD at 90 Cardiac:  normal S1, S2; RRR; no murmur Lungs:  clear to auscultation bilaterally, no wheezing, rhonchi or rales Abd: soft, nontender, no hepatomegaly Ext: Very trace bilateral LE edema, L>R Skin: warm and dry Neuro:  CNs 2-12 intact, no focal abnormalities noted  EKG:  Sinus rhythm, heart rate 96, poor R wave progression, no ischemic changes, no change from prior tracing      ASSESSMENT AND PLAN:  1. Edema:  Overall seems to be consistent with venous insufficiency. I recommended continuing her current regimen. Her left leg is probably worse due to her ongoing knee pain. I have given her a prescription for lower extremity compression stockings. She has been instructed to keep her legs elevated as much as possible. She has been asked to increase activity and to lose weight. Check bmet to follow up on renal fxn and K+ today.  2. Peripartum Cardiomyopathy: Her ejection fraction remains preserved.  Followup with Dr. [name] in 6 months.  3. Hypertension:  Controlled.  4. Knee Pain: I have asked her to followup with her primary care provider for this issue.  Signed, Tereso Newcomer, PA-C  9:10 AM 11/25/2011

## 2011-11-25 NOTE — Telephone Encounter (Signed)
Okay to use tramadol for any pain, headache or musculoskeletal Refer to ortho done as requested

## 2011-11-25 NOTE — Telephone Encounter (Signed)
Pt also would like a referral for a orthopedic specialist to evaluate her knee pain and swelling.

## 2011-11-26 NOTE — Telephone Encounter (Signed)
No, I feel her leg swelling is related to knee.  Therefore, pt should see orthopedist first. Refer to ortho already done Can refer to vein specialist after orthopedist evaluation if still needed

## 2011-11-26 NOTE — Telephone Encounter (Signed)
Pt advised and is requesting a referral to a vein specialist to evaluate leg swelling.

## 2011-11-26 NOTE — Telephone Encounter (Signed)
Pt advised and states that "she's wrong and she's not going to argue but she's not listening and doesn't even care about my health". Pt also says that "either she is not listening or she just does not care" but she has cancelled appointment with Dr Charlett Blake because she has her own ortho which she has made an appointment to see this week.

## 2011-11-26 NOTE — Telephone Encounter (Signed)
Left message on machine for pt to return my call  

## 2011-11-27 ENCOUNTER — Telehealth: Payer: Self-pay | Admitting: Internal Medicine

## 2011-11-27 NOTE — Telephone Encounter (Signed)
Ok, thanks for the note.  I feel this pt would best be served by a PCP with whom she has more trust.   Truddie Hidden, let's generate a dismissal letter from my practice - thanks

## 2011-11-27 NOTE — Telephone Encounter (Signed)
Dismissal Letter sent by Certified Mail 11/27/2011  Received the Return Receipt showing the patient has picked up the Dismissal Letter 12/16/2011

## 2011-11-27 NOTE — Telephone Encounter (Signed)
Pt dismissal activation form completed, signed, and placed on Lou's desk

## 2011-12-04 ENCOUNTER — Telehealth: Payer: Self-pay | Admitting: Cardiology

## 2011-12-04 NOTE — Telephone Encounter (Signed)
plz return call to patient [phone] regarding leg swelling  And coldness issues.  She would like to possibly be referred to vein specialist.

## 2011-12-04 NOTE — Telephone Encounter (Signed)
Unable to call pt from the [facility] office - will have to try to call back next day in the office

## 2011-12-06 NOTE — Telephone Encounter (Signed)
PT AWARE  AWAITING DR [facility] TO REVIEW  WILL CALL  BACK  ONCE ANSWER GIVEN./CY

## 2011-12-06 NOTE — Telephone Encounter (Signed)
Pt states she has been having an increasing problem with burning feeling in both feet but they are cold.  She describes "pins and needles" with pain radiating into her ankles and up into her legs.  The discomfort is keeping her up some nights.  Discussed with pt re: self referral to Dr Donia Ast.  She will call them for an appointment

## 2011-12-06 NOTE — Telephone Encounter (Signed)
Dr Molli Hazard  [address] [facility]  [address] [place]  [place]

## 2011-12-06 NOTE — Telephone Encounter (Signed)
Attempted have pt come into the office for further evaluation.  She refused stating she only wants a referral to a vein specialist.  Will forward to Dr [name] for recommendations.

## 2011-12-09 ENCOUNTER — Telehealth: Payer: Self-pay | Admitting: *Deleted

## 2011-12-09 NOTE — Telephone Encounter (Signed)
Pt was given the name of Dr Hilda Blades.

## 2011-12-09 NOTE — Telephone Encounter (Signed)
Message copied by Sharin Grave on Mon Dec 09, 2011 10:07 AM ------      Message from: Sherri Rad C      Created: Mon Nov 25, 2011 10:03 AM       Dr. [name] and Elita Quick,            Misty Stanley saw Thornton today and is very concerned about the edema in her legs. Scott gave her a prescription today for support socks to be worn and to follow up with Dr. Felicity Coyer. She states she is having a hard time having a lot of confidence in Dr. Felicity Coyer. She wants to get to the bottom of her edema. She is wanting to see a specialist. I advised she try her support hose first. She still wants to see someone or know from you would you recommend a vein specialist. I told her I would get you a message and that [name] would call her back with recommendations.            Thanks

## 2012-01-15 ENCOUNTER — Telehealth: Payer: Self-pay | Admitting: Cardiology

## 2012-01-15 NOTE — Telephone Encounter (Signed)
No SBE or clearance needed per Dr [name].  Office aware. Note will be faxed at their request.

## 2012-01-15 NOTE — Telephone Encounter (Signed)
Pt is having emergency dental surgery today for a broken tooth @ [facility] Dr. Leta Jungling L. Rementer and they need to know if she needs pre meds or clearance of some sort. Please call asap she is there now.

## 2012-01-21 ENCOUNTER — Other Ambulatory Visit: Payer: Self-pay | Admitting: Cardiology

## 2012-02-06 ENCOUNTER — Ambulatory Visit: Payer: Medicare Other | Admitting: Internal Medicine

## 2012-03-11 ENCOUNTER — Telehealth: Payer: Self-pay | Admitting: *Deleted

## 2012-03-11 ENCOUNTER — Telehealth: Payer: Self-pay | Admitting: Cardiology

## 2012-03-11 NOTE — Telephone Encounter (Signed)
Per husband - pt is asleep - states pt has not been feeling well due to her depression and has not been taking her medications as ordered.  He reports she has been complaining of chest pain and palpitations.  She has been given an appointment with an extender for 1/16 at 11:30 am.  Husband is aware and will let pt know.

## 2012-03-11 NOTE — Telephone Encounter (Signed)
Per husband - pt does not want to see anyone but Dr [name] - aware his next available appointment is not until 1/31.  Pt is going to restart her medications as listed however she will be starting carvedilol to 6.25 mg one bid.  Husband states understanding and will call back if any problems or concerns prior to the appointment.

## 2012-03-11 NOTE — Telephone Encounter (Signed)
New Problem:    Patient's husband is calling in because the patient is not feeling well and would like to be seen today.  Patient has not been taking her medication for the past month and a half.  Please call back.

## 2012-03-12 ENCOUNTER — Ambulatory Visit: Payer: Medicare Other | Admitting: Nurse Practitioner

## 2012-03-23 ENCOUNTER — Telehealth: Payer: Self-pay | Admitting: Cardiology

## 2012-03-23 NOTE — Telephone Encounter (Signed)
Pt was scheduled to see PA 1/16 at 11:30 but she didn't want to see a PA.  She was scheduled to see Dr [name] 1/31 at 3pm and her husband was agreeable.  Pt re-scheduled to see Dr [name] per her request.

## 2012-03-23 NOTE — Telephone Encounter (Signed)
Pt called asking For Copies Of Records, She has appt Friday W/ Hochrein She will Sign ROI and Pick up records after Appt. 03/23/12/KM

## 2012-03-23 NOTE — Telephone Encounter (Signed)
New Problem:    Patient called in wanting to know why her appointment on 03/27/12 is at 3:00pm instead of 11:30am like she believes her husband was told.  Please call back.

## 2012-03-27 ENCOUNTER — Telehealth: Payer: Self-pay | Admitting: Cardiology

## 2012-03-27 ENCOUNTER — Ambulatory Visit: Payer: Medicare Other | Admitting: Cardiology

## 2012-03-27 NOTE — Telephone Encounter (Signed)
Pt Cancelled Appt For Today (03/27/12) W/ Hochrein I will place her records in My Drawer For Whenever She Decides to Pick up 03/27/12/KM

## 2012-04-23 ENCOUNTER — Ambulatory Visit: Payer: Medicare Other | Admitting: Cardiology

## 2012-05-18 ENCOUNTER — Other Ambulatory Visit: Payer: Self-pay | Admitting: *Deleted

## 2012-05-18 ENCOUNTER — Encounter: Payer: Self-pay | Admitting: Cardiology

## 2012-05-18 ENCOUNTER — Ambulatory Visit (INDEPENDENT_AMBULATORY_CARE_PROVIDER_SITE_OTHER): Payer: Medicare Other | Admitting: Cardiology

## 2012-05-18 VITALS — BP 142/90 | HR 97 | Ht 64.0 in | Wt 294.2 lb

## 2012-05-18 DIAGNOSIS — I10 Essential (primary) hypertension: Secondary | ICD-10-CM

## 2012-05-18 DIAGNOSIS — E669 Obesity, unspecified: Secondary | ICD-10-CM

## 2012-05-18 DIAGNOSIS — O903 Peripartum cardiomyopathy: Secondary | ICD-10-CM

## 2012-05-18 LAB — BASIC METABOLIC PANEL
BUN: 12 mg/dL (ref 6–23)
CO2: 29 mEq/L (ref 19–32)
Calcium: 8.9 mg/dL (ref 8.4–10.5)
Chloride: 102 mEq/L (ref 96–112)
Creatinine, Ser: 0.8 mg/dL (ref 0.4–1.2)
GFR: 79.43 mL/min (ref >60.00)
Glucose, Bld: 100 mg/dL — ABNORMAL HIGH (ref 70–99)
Potassium: 4.3 mEq/L (ref 3.5–5.1)
Sodium: 137 mEq/L (ref 135–145)

## 2012-05-18 MED ORDER — CARVEDILOL 25 MG PO TABS: 25.0000 mg | ORAL_TABLET | Freq: Two times a day (BID) | ORAL | Status: DC

## 2012-05-18 MED ORDER — SIMVASTATIN 20 MG PO TABS: 20.0000 mg | ORAL_TABLET | Freq: Every day | ORAL | Status: DC

## 2012-05-18 MED ORDER — FUROSEMIDE 20 MG PO TABS: 20.0000 mg | ORAL_TABLET | Freq: Every day | ORAL | Status: DC

## 2012-05-18 MED ORDER — POTASSIUM CHLORIDE CRYS ER 10 MEQ PO TBCR: 10.0000 meq | EXTENDED_RELEASE_TABLET | Freq: Every day | ORAL | Status: DC

## 2012-05-18 MED ORDER — ENALAPRIL MALEATE 20 MG PO TABS: 20.0000 mg | ORAL_TABLET | Freq: Two times a day (BID) | ORAL | Status: DC

## 2012-05-18 MED ORDER — HYDRALAZINE HCL 25 MG PO TABS: 25.0000 mg | ORAL_TABLET | Freq: Every day | ORAL | Status: DC

## 2012-05-18 MED ORDER — CARVEDILOL 6.25 MG PO TABS: 6.2500 mg | ORAL_TABLET | Freq: Two times a day (BID) | ORAL | Status: DC

## 2012-05-18 MED ORDER — CARVEDILOL 25 MG PO TABS: ORAL_TABLET | ORAL | Status: DC

## 2012-05-18 NOTE — Progress Notes (Signed)
HPI The patient presents for evaluation of edema and palpitations.   She has called several times with very symptoms. However, clearly she's very depressed and has a lot of stress and anxiety.  She was fired by Dr. Felicity Coyer and is going to start seeing Dr. Reche Dixon.  She says she's going to walk into psychiatry clinic tomorrow and reestablish care. She's been having leg pain and has some venous insufficiency. She has seen vein a vascular specialist. Edema that she had is improving she wears compression stockings. She's had palpitations, dyspnea, chest discomfort. However, these have been intermittent and associated with stress. She did have a BNP in the 30s last September.   Allergies  Allergen Reactions  . Duloxetine     REACTION: Sucidal thoughts  . Penicillins     REACTION: rash    Current Outpatient Prescriptions  Medication Sig Dispense Refill  . amphetamine-dextroamphetamine (ADDERALL XR) 30 MG 24 hr capsule Take 1 capsule (30 mg total) by mouth every morning.  30 capsule  0  . carvedilol (COREG) 25 MG tablet Take 25 mg by mouth 2 (two) times daily with a meal. PT TAKES BOTH 6.25 mg bid and 25mg  bid NAME BRAND ONLY      . carvedilol (COREG) 6.25 MG tablet Take 6.25 mg by mouth 2 (two) times daily with a meal. BRAND NAME ONLY      . clonazePAM (KLONOPIN) 1 MG tablet Take 1 mg by mouth 2 (two) times daily as needed.        . enalapril (VASOTEC) 20 MG tablet Take 1 tablet (20 mg total) by mouth 2 (two) times daily.  180 tablet  3  . furosemide (LASIX) 40 MG tablet Take 20 mg by mouth daily.      [name] gabapentin (NEURONTIN) 300 MG capsule Take 300 mg by mouth as needed.       . hydrALAZINE (APRESOLINE) 25 MG tablet Take 25 mg by mouth daily.      [name] ibuprofen (ADVIL,MOTRIN) 800 MG tablet Take 800 mg by mouth as needed for pain.      [name] lamoTRIgine (LAMICTAL) 200 MG tablet Take 200 mg by mouth daily.      [name] loratadine (CLARITIN) 10 MG tablet Take 1 tablet (10 mg total) by mouth daily as needed  for allergies.  30 tablet  2  . potassium chloride (KLOR-CON M10) 10 MEQ tablet Take 1 tablet (10 mEq total) by mouth daily.  90 tablet  3  . simvastatin (ZOCOR) 20 MG tablet Take 20 mg by mouth daily.      . traMADol (ULTRAM) 50 MG tablet Take 1-2 tablets (50-100 mg total) by mouth every 6 (six) hours as needed for pain.  60 tablet  0   No current facility-administered medications for this visit.    Past Medical History  Diagnosis Date  . ADHD   . CARDIOMYOPATHY, PERIPARTUM, POSTPARTUM     a. EF previously 30-40%,  b.  resolved, EF 60%    c.  echo 10/2011:  mild LVH, EF 50%, mild LAE,   . MIGRAINE HEADACHE   . OBESITY   . DEPRESSION   . Essential hypertension, benign   . CARPAL TUNNEL SYNDROME, RIGHT   . Anxiety   . Edema     a. LE venous dopplers negative for DVT 10/2011    Past Surgical History  Procedure Laterality Date  . Gastric bypass    . Child birth  2003  . Cardiomyopathy  2003   ROS:  As stated in the HPI and negative for all other systems.  PHYSICAL EXAM BP 142/90  Pulse 97  Ht 5\' 4"  (1.626 m)  Wt 294 lb 3.2 oz (133.448 kg)  BMI 50.47 kg/m2 GENERAL:  Well appearing HEENT:  Pupils equal round and reactive, fundi not visualized, oral mucosa unremarkable NECK:  No jugular venous distention, waveform within normal limits, carotid upstroke brisk and symmetric, no bruits, no thyromegaly LYMPHATICS:  No cervical, inguinal adenopathy LUNGS:  Clear to auscultation bilaterally BACK:  No CVA tenderness CHEST:  Unremarkable HEART:  PMI not displaced or sustained,S1 and S2 within normal limits, no S3, no S4, no clicks, no rubs, no murmurs ABD:  Flat, positive bowel sounds normal in frequency in pitch, no bruits, no rebound, no guarding, no midline pulsatile mass, no hepatomegaly, no splenomegaly, obese EXT:  2 plus pulses throughout, mild to moderate left greater than right lower extremityedema, no cyanosis no clubbing NEURO:  Cranial nerves II through XII grossly intact,  motor grossly intact throughout PSYCH:  Cognitively intact, oriented to person place and time, tearful   ASSESSMENT AND PLAN  Edema - This is improved and related to venous insufficiency. No further cardiovascular testing is suggested.  CARDIOMYOPATHY, PERIPARTUM, POSTPARTUM -  Her EF was low normal when checked in September. BNP was normal. No further evaluation is indicated.  ESSENTIAL HYPERTENSION, BENIGN -  The blood pressure is at target. No change in medications is indicated. We will continue with therapeutic lifestyle changes (TLC).  ANXIETY DEPRESSION - This is the main issue. This drives many of her sympotms.  I will defer to Dr. Reche Dixon   (Greater than 25 minutes reviewing all data with greater than 50% face to face with the patient).

## 2012-05-18 NOTE — Patient Instructions (Addendum)
The current medical regimen is effective;  continue present plan and medications.  Please have blood work today (BMP).  Follow up with Dr Reche Dixon for cholesterol treatment and testing.  Follow up in 1 year with Dr [name].  You will receive a letter in the mail 2 months before you are due.  Please call us when you receive this letter to schedule your follow up appointment.

## 2012-05-19 ENCOUNTER — Telehealth: Payer: Self-pay | Admitting: *Deleted

## 2012-05-19 NOTE — Telephone Encounter (Signed)
Message copied by Tarri Fuller on Tue May 19, 2012  3:36 PM ------      Message from: [name]      Created: Mon May 18, 2012  5:25 PM       The labs are OK.  Call Ms. Farha with the results and send results to the primary provider.       ------

## 2012-05-19 NOTE — Telephone Encounter (Signed)
pt notified about lab results with verbal understanding , will fax to PCP as well

## 2012-06-05 ENCOUNTER — Telehealth: Payer: Self-pay | Admitting: Cardiology

## 2012-06-05 NOTE — Telephone Encounter (Signed)
Per pt call - very upset crying - pt has slurred speech and is very difficult to understand.  States from what I can understand she took Clonidine Sunday night because she couldn't sleep as Rxed by Dr Reche Dixon.  When she woke Monday she reports she wasn't "doing so good"  She called her friend who took her to [facility].  She reports being there since Monday but doesn't know what is going on.  She reports she is not getting her medications correctly and needs [place] to clarify them.  She is begging me for help and not to forget her.  She reports she sees an emergency exit and may try to go thru it.  I advised her she should not do this.  Instructed pt I will attempt to call to help figure out what is going on and to clarify her medications.  I called [facility] to talk her nurse but was told she was on break and I need to call back in 15 minutes.  I will attempt to call back.

## 2012-06-05 NOTE — Telephone Encounter (Signed)
New Problem:    Patient called in wanting to speak with you.

## 2012-06-05 NOTE — Telephone Encounter (Signed)
Spoke with [name] who is aware I located documentation of the reason pt needs brand name only.  (rash and indigestion).  She will make note of it in the chart.  She is aware that pt has again called here wanting me to review the medications she is being been there for her psychiatric condition.  [name] is aware I informed the pt I have no control over medications RXd there and no knowledge of psychiatric drugs as related to her case. She is going to discuss with pt not to continue to call here or her phone privileges will be revoked.

## 2012-06-05 NOTE — Telephone Encounter (Signed)
Called and spoke [name] who is the nurse taking care of the patient today.  I voiced my concerns with her as conveyed by the pt.  Darlene states understanding however is not at liberty to discuss pt's care with me due to HIPPA.  She is aware the pt states she doesn't know why she is there, she is over medicated, can not walk without holding on to something and they are giving her her medications incorrectly.    While typing this note - the pt has called and has signed a release allowing them to discuss her care with me.  Pt wants to go home but they are telling her she cant because of instability with walking.  She states she is having trouble walking because of the medications they are giving her.  The nurse there is to call back to discuss her care.

## 2012-06-05 NOTE — Telephone Encounter (Signed)
[name] called back from [facility]  Pt had an involuntary commitment for suicidal ideation, manic depression misuse of medications.  They are adjusting her psychiatric medications and this may be the cause of her slurred speech and mental status changes.  [name] needs the reason as to why pt can only take brand name coreg.  Advised there is no documentation of the reason in this electronic medical record but it is known to me that the pt has only been able to tolerate brand name for a number of years.  Aware I will locate the documentation and call her back.  As soon as I got off the phone with the nurse - pt has called pt again and is upset because of her medications and what she is being given.  She is aware I have no control over the medications she is given while she is there.  She asked that I verify her medication with [name] so that she knows what she is taking.  She does not trust them (the caregivers) there.

## 2012-07-23 ENCOUNTER — Telehealth: Payer: Self-pay | Admitting: Cardiology

## 2012-07-23 NOTE — Telephone Encounter (Signed)
Ok to use that dietary supplement.

## 2012-07-23 NOTE — Telephone Encounter (Signed)
Pt aware.

## 2012-07-23 NOTE — Telephone Encounter (Signed)
New problem     A friend of hers had told her they had use papaya  to make a shake to reduce or help with her blood pressure. Please advise is this safe or any concern from Dr. [name]

## 2012-07-23 NOTE — Telephone Encounter (Signed)
Will have MD review

## 2012-07-23 NOTE — Telephone Encounter (Signed)
Per Dr [name] OK to use papaya

## 2012-10-20 ENCOUNTER — Telehealth: Payer: Self-pay | Admitting: Cardiology

## 2012-10-20 NOTE — Telephone Encounter (Signed)
New Prob  Pt states she needs clearance to have a root canal. She said she believes that [facility] faxed a letter today. She wants to make sure we got it and she also said she needs to keep a copy as well. She asked if you could give her a call back.

## 2012-10-20 NOTE — Telephone Encounter (Signed)
Pt aware form received and that I will take it to [facility] office tomorrow for Dr [name] to review and complete.  Form needs to be faxed to [phone] [facility] and to [phone] [facility]

## 2012-10-21 ENCOUNTER — Telehealth: Payer: Self-pay | Admitting: Cardiology

## 2012-10-21 NOTE — Telephone Encounter (Signed)
Pt aware paperwork faxed to MD for dental procedure to both [phone] and [phone]  Original mailed to pt at her request

## 2012-10-21 NOTE — Telephone Encounter (Signed)
New problem   Pt is calling in reference to the letter that was suppose to be written about cardiac clearance before sx. Please call pt

## 2012-10-21 NOTE — Telephone Encounter (Signed)
New Problem   Pt states the fax that was sent to the Dr. Isidore Moos was not legible and the dentist states they will not complete the procedure on 8/27.

## 2012-10-21 NOTE — Telephone Encounter (Signed)
Pt aware paperwork has been completed and faxed again.  Confirmation of fax going thru to both offices was received.

## 2012-10-21 NOTE — Telephone Encounter (Signed)
**Note De-Identified  Obfuscation** Pt states that she needs letter re faxed to both dentist offices and to include in note how high pts BP can be to have dental procedures (?). She needs notes faxed by 3:45 as their office closes at 4 pm.

## 2012-10-27 ENCOUNTER — Telehealth: Payer: Self-pay | Admitting: Cardiology

## 2012-10-27 NOTE — Telephone Encounter (Signed)
New Prob  Pt states that the office that was going to do the root canal cannot do it. She wants to know can you fax it to [phone] [facility] and then can you mail her a copy.

## 2012-11-10 NOTE — Telephone Encounter (Signed)
Left messages for pt who never returned my call

## 2013-02-08 ENCOUNTER — Telehealth: Payer: Self-pay | Admitting: Cardiology

## 2013-02-08 NOTE — Telephone Encounter (Signed)
Last ov summary faxed to Dr. Norval Gable office at [phone].  Awaiting confirmation

## 2013-02-08 NOTE — Telephone Encounter (Signed)
Spoke with Marchelle Folks at [facility] who states the paperwork was received and that she will contact the patient.

## 2013-02-08 NOTE — Telephone Encounter (Signed)
Spoke with patient who states she is having excruciating tooth pain and needs extraction.  Patient states she needs cardiac clearance to have extraction tomorrow.  Clearance was faxed several months ago to a different dentist and patient states they would not do the procedure.  States she has been given the run-around and has finally found a dentist that can do the work she needs.  Per patient [facility] will not accept the previous clearance faxed to the other offices - states it has to be on their letterhead.  Patient states [facility] is faxing clearance and she would like it faxed back to them today.  I advised patient that Dr. [name] is not in the office today but that I will ask Dr. Myrtis Ser, the DOD to sign clearance once I get the fax.  I advised patient that I will call her back to let her know whether or not clearance has been given.  Patient verbalized understanding.

## 2013-02-08 NOTE — Telephone Encounter (Signed)
New problem   Pt need tooth extraction and dental office [facility] is faxing over clearance form for pt to have sx tomorrow. Pt need form to be filled out ASAP so she can go out of town.

## 2013-02-09 NOTE — Telephone Encounter (Signed)
Follow Up  Pt called requesting a referral to a dentist that will take pt's with heart conditions. Please call

## 2013-02-09 NOTE — Telephone Encounter (Signed)
New message  Pt called states that the dental office is still stating that they do not have the clearance.. Would like a call back to assist.

## 2013-02-11 ENCOUNTER — Encounter: Payer: Self-pay | Admitting: *Deleted

## 2013-02-11 NOTE — Telephone Encounter (Signed)
Letter faxed to Dr [facility] for clearance to number below.  Lm for pt that letter of clearance was faxed and their office will contact her once records have been reviewed.

## 2013-04-10 ENCOUNTER — Other Ambulatory Visit: Payer: Self-pay | Admitting: Cardiology

## 2013-05-17 ENCOUNTER — Emergency Department (HOSPITAL_COMMUNITY)
Admission: EM | Admit: 2013-05-17 | Discharge: 2013-05-17 | Disposition: A | Discharge status: Home / Self Care | Payer: Medicare Other | Attending: Emergency Medicine | Admitting: Emergency Medicine

## 2013-05-17 ENCOUNTER — Emergency Department (HOSPITAL_COMMUNITY): Payer: Medicare Other

## 2013-05-17 ENCOUNTER — Encounter (HOSPITAL_COMMUNITY): Payer: Self-pay | Admitting: Emergency Medicine

## 2013-05-17 DIAGNOSIS — Z9889 Other specified postprocedural states: Secondary | ICD-10-CM | POA: Insufficient documentation

## 2013-05-17 DIAGNOSIS — Z9884 Bariatric surgery status: Secondary | ICD-10-CM | POA: Insufficient documentation

## 2013-05-17 DIAGNOSIS — I10 Essential (primary) hypertension: Secondary | ICD-10-CM | POA: Insufficient documentation

## 2013-05-17 DIAGNOSIS — I509 Heart failure, unspecified: Secondary | ICD-10-CM | POA: Insufficient documentation

## 2013-05-17 DIAGNOSIS — Z9089 Acquired absence of other organs: Secondary | ICD-10-CM | POA: Insufficient documentation

## 2013-05-17 DIAGNOSIS — Z3202 Encounter for pregnancy test, result negative: Secondary | ICD-10-CM | POA: Insufficient documentation

## 2013-05-17 DIAGNOSIS — Z8639 Personal history of other endocrine, nutritional and metabolic disease: Secondary | ICD-10-CM | POA: Insufficient documentation

## 2013-05-17 DIAGNOSIS — G43909 Migraine, unspecified, not intractable, without status migrainosus: Secondary | ICD-10-CM | POA: Insufficient documentation

## 2013-05-17 DIAGNOSIS — Z880 Allergy status to penicillin: Secondary | ICD-10-CM | POA: Insufficient documentation

## 2013-05-17 DIAGNOSIS — Z79899 Other long term (current) drug therapy: Secondary | ICD-10-CM | POA: Insufficient documentation

## 2013-05-17 DIAGNOSIS — R1013 Epigastric pain: Secondary | ICD-10-CM | POA: Insufficient documentation

## 2013-05-17 DIAGNOSIS — Z87891 Personal history of nicotine dependence: Secondary | ICD-10-CM | POA: Insufficient documentation

## 2013-05-17 DIAGNOSIS — F411 Generalized anxiety disorder: Secondary | ICD-10-CM | POA: Insufficient documentation

## 2013-05-17 DIAGNOSIS — F909 Attention-deficit hyperactivity disorder, unspecified type: Secondary | ICD-10-CM | POA: Insufficient documentation

## 2013-05-17 DIAGNOSIS — Z862 Personal history of diseases of the blood and blood-forming organs and certain disorders involving the immune mechanism: Secondary | ICD-10-CM | POA: Insufficient documentation

## 2013-05-17 LAB — I-STAT TROPONIN, ED: Troponin i, poc: 0.01 ng/mL (ref 0.00–0.08)

## 2013-05-17 LAB — URINALYSIS, ROUTINE W REFLEX MICROSCOPIC
Bilirubin Urine: NEGATIVE
Glucose, UA: NEGATIVE mg/dL
Hgb urine dipstick: NEGATIVE
Ketones, ur: NEGATIVE mg/dL
Leukocytes, UA: NEGATIVE
Nitrite: NEGATIVE
Protein, ur: 30 mg/dL — AB
Specific Gravity, Urine: 1.023 (ref 1.005–1.030)
Urobilinogen, UA: 1 mg/dL (ref 0.0–1.0)
pH: 7 (ref 5.0–8.0)

## 2013-05-17 LAB — URINE MICROSCOPIC-ADD ON

## 2013-05-17 LAB — CBC
HCT: 43.3 % (ref 36.0–46.0)
Hemoglobin: 14 g/dL (ref 12.0–15.0)
MCH: 26.9 pg (ref 26.0–34.0)
MCHC: 32.3 g/dL (ref 30.0–36.0)
MCV: 83.1 fL (ref 78.0–100.0)
Platelets: 304 10*3/uL (ref 150–400)
RBC: 5.21 MIL/uL — ABNORMAL HIGH (ref 3.87–5.11)
RDW: 15.5 % (ref 11.5–15.5)
WBC: 10.2 10*3/uL (ref 4.0–10.5)

## 2013-05-17 LAB — BASIC METABOLIC PANEL
BUN: 14 mg/dL (ref 6–23)
CO2: 26 mEq/L (ref 19–32)
Calcium: 8.9 mg/dL (ref 8.4–10.5)
Chloride: 103 mEq/L (ref 96–112)
Creatinine, Ser: 0.62 mg/dL (ref 0.50–1.10)
GFR calc Af Amer: >90 mL/min (ref >90)
GFR calc non Af Amer: >90 mL/min (ref >90)
Glucose, Bld: 130 mg/dL — ABNORMAL HIGH (ref 70–99)
Potassium: 3.7 mEq/L (ref 3.7–5.3)
Sodium: 143 mEq/L (ref 137–147)

## 2013-05-17 LAB — HEPATIC FUNCTION PANEL
ALT: 19 U/L (ref 0–35)
AST: 20 U/L (ref 0–37)
Albumin: 3.5 g/dL (ref 3.5–5.2)
Alkaline Phosphatase: 37 U/L — ABNORMAL LOW (ref 39–117)
Bilirubin, Direct: <0.2 mg/dL (ref 0.0–0.3)
Total Bilirubin: 1.1 mg/dL (ref 0.3–1.2)
Total Protein: 7.3 g/dL (ref 6.0–8.3)

## 2013-05-17 LAB — POC URINE PREG, ED: Preg Test, Ur: NEGATIVE

## 2013-05-17 LAB — PRO B NATRIURETIC PEPTIDE: Pro B Natriuretic peptide (BNP): 1491 pg/mL — ABNORMAL HIGH (ref 0–125)

## 2013-05-17 LAB — LIPASE, BLOOD: Lipase: 25 U/L (ref 11–59)

## 2013-05-17 MED ORDER — PANTOPRAZOLE SODIUM 40 MG PO TBEC
40.0000 mg | DELAYED_RELEASE_TABLET | Freq: Once | ORAL | Status: AC
  Administered 2013-05-17: 40 mg via ORAL
  Filled 2013-05-17: qty 1

## 2013-05-17 MED ORDER — FAMOTIDINE 20 MG PO TABS
20.0000 mg | ORAL_TABLET | Freq: Once | ORAL | Status: AC
  Administered 2013-05-17: 20 mg via ORAL
  Filled 2013-05-17: qty 1

## 2013-05-17 MED ORDER — NITROGLYCERIN 2 % TD OINT
1.0000 [in_us] | TOPICAL_OINTMENT | Freq: Once | TRANSDERMAL | Status: AC
  Administered 2013-05-17: 1 [in_us] via TOPICAL
  Filled 2013-05-17: qty 1

## 2013-05-17 MED ORDER — FUROSEMIDE 10 MG/ML IJ SOLN
40.0000 mg | Freq: Once | INTRAMUSCULAR | Status: AC
  Administered 2013-05-17: 40 mg via INTRAVENOUS
  Filled 2013-05-17: qty 4

## 2013-05-17 MED ORDER — GI COCKTAIL ~~LOC~~
30.0000 mL | Freq: Once | ORAL | Status: AC
  Administered 2013-05-17: 30 mL via ORAL
  Filled 2013-05-17: qty 30

## 2013-05-17 NOTE — ED Notes (Signed)
Had the  Same full feeling a few times before  Last year and last month took some prune juice never has been constipated before she states but when she was kid she would maybe go every 3 days

## 2013-05-17 NOTE — Discharge Instructions (Signed)
Heart Failure Heart failure means your heart has trouble pumping blood. This makes it hard for your body to work well. Heart failure is usually a long-term (chronic) condition. You must take good care of yourself and follow your doctor's treatment plan. HOME CARE  Take your heart medicine as told by your doctor.  Do not stop taking medicine unless your doctor tells you to.  Do not skip any dose of medicine.  Refill your medicines before they run out.  Take other medicines only as told by your doctor or pharmacist.  Stay active if told by your doctor. The elderly and people with severe heart failure should talk with a doctor about physical activity.  Eat heart healthy foods. Choose foods that are without trans fat and are low in saturated fat, cholesterol, and salt (sodium). This includes fresh or frozen fruits and vegetables, fish, lean meats, fat-free or low-fat dairy foods, whole grains, and high-fiber foods. Lentils and dried peas and beans (legumes) are also good choices.  Limit salt if told by your doctor.  Cook in a healthy way. Roast, grill, broil, bake, poach, steam, or stir-fry foods.  Limit fluids as told by your doctor.  Weigh yourself every morning. Do this after you pee (urinate) and before you eat breakfast. Write down your weight to give to your doctor.  Take your blood pressure and write it down if your doctor tell you to.  Ask your doctor how to check your pulse. Check your pulse as told.  Lose weight if told by your doctor.  Stop smoking or chewing tobacco. Do not use gum or patches that help you quit without your doctor's approval.  Schedule and go to doctor visits as told.  Nonpregnant women should have no more than 1 drink a day. Men should have no more than 2 drinks a day. Talk to your doctor about drinking alcohol.  Stop illegal drug use.  Stay current with shots (immunizations).  Manage your health conditions as told by your doctor.  Learn to manage  your stress.  Rest when you are tired.  If it is really hot outside:  Avoid intense activities.  Use air conditioning or fans, or get in a cooler place.  Avoid caffeine and alcohol.  Wear loose-fitting, lightweight, and light-colored clothing.  If it is really cold outside:  Avoid intense activities.  Layer your clothing.  Wear mittens or gloves, a hat, and a scarf when going outside.  Avoid alcohol.  Learn about heart failure and get support as needed.  Get help to maintain or improve your quality of life and your ability to care for yourself as needed. GET HELP IF:   You gain 03 lb/1.4 kg or more in 1 day or 05 lb/2.3 kg in a week.  You are more short of breath than usual.  You cannot do your normal activities.  You tire easily.  You cough more than normal, especially with activity.  You have any or more puffiness (swelling) in areas such as your hands, feet, ankles, or belly (abdomen).  You cannot sleep because it is hard to breathe.  You feel like your heart is beating fast (palpitations).  You get dizzy or lightheaded when you stand up. GET HELP RIGHT AWAY IF:   You have worse trouble breathing.  There is a change in mental status, such as becoming less alert or not being able to focus.  You have chest pain or discomfort.  You faint.  You develop a fever or  other concerns. MAKE SURE YOU:   Understand these instructions.  Will watch your condition.  Will get help right away if you are not doing well or get worse. Document Released: 11/21/2007 Document Revised: 06/08/2012 Document Reviewed: 09/12/2011 [facility] Patient Information 2014 [place], [place].  Restart your Lasix and take 40 mg daily for the next 2 days, then resume 20mg  once daily.

## 2013-05-17 NOTE — ED Notes (Signed)
To xray via str

## 2013-05-17 NOTE — ED Provider Notes (Addendum)
CSN: [id number]     Arrival date & time 05/17/13  1660 History   First MD Initiated Contact with Patient 05/17/13 [phone]     Chief Complaint  Patient presents with  . Shortness of Breath  . Abdominal Pain     (Consider location/radiation/quality/duration/timing/severity/associated sxs/prior Treatment) HPI 48 year old female with history of laparoscopic cholecystectomy in patient states she did not had gastric bypass surgery which is contrary to the past surgical history documented in the patient's record; patient states the past surgical history in the computer record is wrong; patient presents with gradual onset 4 day history of epigastric fullness which is mild discomfort constant worse with palpation worse with position changes worse with eating or drinking nonexertional nonpleuritic in without chest pain without fever with mild to moderate exertional shortness of breath; it is also hard to take a deep breath because her abdomen feels full she is no nausea vomiting no diarrhea no bloody stool no dysuria no vaginal bleeding no vaginal discharge. There is no treatment prior to arrival. She does have a slight cough for last few days. She has a history of postpartum cardiomyopathy which is improved (EF 30% improved to 50%) she also has a history of peripheral edema in the past which is also not problem currently. She ran out of her Lasix when her symptoms started. Pt has not weighed herself recently. Past Medical History  Diagnosis Date  . ADHD   . CARDIOMYOPATHY, PERIPARTUM, POSTPARTUM     a. EF previously 30-40%,  b.  resolved, EF 60%    c.  echo 10/2011:  mild LVH, EF 50%, mild LAE,   . MIGRAINE HEADACHE   . OBESITY   . DEPRESSION   . Essential hypertension, benign   . CARPAL TUNNEL SYNDROME, RIGHT   . Anxiety   . Edema     a. LE venous dopplers negative for DVT 10/2011   Past Surgical History  Procedure Laterality Date  . Gastric bypass    . Child birth  2003  . Cardiomyopathy  2003    Family History  Problem Relation Age of Onset  . Hypertension Other   . Diabetes Other   . Colitis Other   . Alcohol abuse Other    History  Substance Use Topics  . Smoking status: Former Smoker    Quit date: 02/25/1997  . Smokeless tobacco: Not on file     Comment: Married, lives with spouse (when he is not traveling) and son  . Alcohol Use: No   OB History   Grav Para Term Preterm Abortions TAB SAB Ect Mult Living                 Review of Systems  10 Systems reviewed and are negative for acute change except as noted in the HPI.  Allergies  Duloxetine and Penicillins  Home Medications   Current Outpatient Rx  Name  Route  Sig  Dispense  Refill  . amLODipine (NORVASC) 5 MG tablet   Oral   Take 5 mg by mouth daily.         [name] amphetamine-dextroamphetamine (ADDERALL XR) 30 MG 24 hr capsule   Oral   Take 1 capsule (30 mg total) by mouth every morning.   30 capsule   0   . carvedilol (COREG) 25 MG tablet   Oral   Take 25 mg by mouth 2 (two) times daily with a meal.         . carvedilol (COREG) 6.25 MG tablet  Oral   Take 6.25 mg by mouth 2 (two) times daily with a meal.         . dextromethorphan-guaiFENesin (TUSSIN DM) 10-100 MG/5ML liquid   Oral   Take 10 mLs by mouth every 4 (four) hours as needed for cough.         . enalapril (VASOTEC) 20 MG tablet   Oral   Take 1 tablet (20 mg total) by mouth 2 (two) times daily.   180 tablet   3   . fluconazole (DIFLUCAN) 150 MG tablet   Oral   Take 150 mg by mouth once.         . furosemide (LASIX) 20 MG tablet   Oral   Take 1 tablet (20 mg total) by mouth daily.   90 tablet   3   . lamoTRIgine (LAMICTAL) 200 MG tablet   Oral   Take 200 mg by mouth daily.         . potassium chloride (KLOR-CON M10) 10 MEQ tablet   Oral   Take 1 tablet (10 mEq total) by mouth daily.   90 tablet   3   . simvastatin (ZOCOR) 20 MG tablet   Oral   Take 1 tablet (20 mg total) by mouth daily.   90  tablet   3   . clindamycin (CLEOCIN T) 1 % lotion   Topical   Apply 1 application topically as directed.         [name] EXPIRED: loratadine (CLARITIN) 10 MG tablet   Oral   Take 1 tablet (10 mg total) by mouth daily as needed for allergies.   30 tablet   2    BP 130/92  Pulse 91  Temp(Src) 98.4 F (36.9 C) (Oral)  Resp 19  Ht 5' (1.524 m)  SpO2 95% Physical Exam  Nursing note and vitals reviewed. Constitutional:  Awake, alert, nontoxic appearance.  HENT:  Head: Atraumatic.  Eyes: Right eye exhibits no discharge. Left eye exhibits no discharge.  Neck: Neck supple.  Cardiovascular: Normal rate and regular rhythm.   No murmur heard. Pulmonary/Chest: Effort normal and breath sounds normal. No respiratory distress. She has no wheezes. She has no rales. She exhibits no tenderness.  Normal room air pulse oximetry at 95%  Abdominal: Soft. Bowel sounds are normal. She exhibits no distension and no mass. There is tenderness. There is no rebound and no guarding.  Minimal epigastric tenderness only; the rest of the abdomen is nontender without rebound  Musculoskeletal: She exhibits no edema and no tenderness.  Baseline ROM, no obvious new focal weakness.  Neurological:  Mental status and motor strength appears baseline for patient and situation.  Skin: No rash noted.  Psychiatric: She has a normal mood and affect.    ED Course  Procedures (including critical care time) Patient understand and agree with initial ED impression and plan with expectations set for ED visit. Pt feels improved after observation and/or treatment in ED. Labs and CXR suggest HF exacerbation. Pt has Cards appt this week and will re-start Lasix. Patient voided over 1 L in the emergency room feels better once discharge. [place] Reviewed  CBC - Abnormal; Notable for the following:    RBC 5.21 (*)    All other components within normal limits  BASIC METABOLIC PANEL - Abnormal; Notable for the  following:    Glucose, Bld 130 (*)    All other components within normal limits  PRO B NATRIURETIC PEPTIDE - Abnormal; Notable  for the following:    Pro B Natriuretic peptide (BNP) 1491.0 (*)    All other components within normal limits  URINALYSIS, ROUTINE W REFLEX MICROSCOPIC - Abnormal; Notable for the following:    APPearance CLOUDY (*)    Protein, ur 30 (*)    All other components within normal limits  HEPATIC FUNCTION PANEL - Abnormal; Notable for the following:    Alkaline Phosphatase 37 (*)    All other components within normal limits  URINE MICROSCOPIC-ADD ON - Abnormal; Notable for the following:    Squamous Epithelial / LPF FEW (*)    Bacteria, UA FEW (*)    All other components within normal limits  LIPASE, BLOOD  I-STAT TROPOININ, ED  POC URINE PREG, ED   Imaging Review Dg Chest 2 View  05/17/2013   CLINICAL DATA:  Shortness of breath hypertension, cardiomyopathy.  EXAM: CHEST  2 VIEW  COMPARISON:  12/25/2012  FINDINGS: There is cardiomegaly. Diffuse interstitial prominence throughout the lungs has increased since prior study concerning for interstitial edema. Bibasilar focal atelectasis or edema. No effusions. No acute bony abnormality.  IMPRESSION: Findings compatible with mild to moderate CHF.   Electronically Signed   By: Rolm Baptise M.D.   On: 05/17/2013 10:10     EKG Interpretation   Date/Time:  Monday May 17 2013 08:23:49 EDT Ventricular Rate:  107 PR Interval:  154 QRS Duration: 90 QT Interval:  370 QTC Calculation: 493 R Axis:   4 Text Interpretation:  Sinus tachycardia with occasional Premature  ventricular complexes and Fusion complexes Biatrial enlargement  Nonspecific T wave abnormality No significant change since last tracing  Confirmed by [facility]  MD, Jenny Reichmann ([id number]) on 05/17/2013 8:39:32 AM      MDM   Final diagnoses:  Heart failure    I doubt any other EMC precluding discharge at this time including, but not necessarily limited to the  following:SBI, ACS.    Babette Relic, MD 05/17/13 1816  Babette Relic, MD 05/17/13 [phone]

## 2013-05-17 NOTE — ED Notes (Signed)
Pt reports shortness of breath and epigastric fullness feeling onset Friday. Pt reports history of cardiomyopathy and symptoms similar.

## 2013-05-17 NOTE — ED Notes (Signed)
Pt states has had sob cough and bloating feeling in stomach since Friday  Cough made her feel more sob and  She took some tussinex for the cough no nausea she states no diarrhea or vomiting  Only eating small amts eating big makes her feel fuller.

## 2013-05-17 NOTE — ED Notes (Signed)
Patient states she has not taken any of her home medications since Thursday.

## 2013-05-18 ENCOUNTER — Telehealth (HOSPITAL_BASED_OUTPATIENT_CLINIC_OR_DEPARTMENT_OTHER): Payer: Self-pay | Admitting: Emergency Medicine

## 2013-05-18 NOTE — Telephone Encounter (Signed)
patient called stated she was told she would recieve an RX of Lasix and did not receive RX.  Notified Dr Stevie Kern. T.O. Given for  RX Lasix 20 mg tabs, take 40 mg today and tomorrow, then 20 mg daily, dispense 10 tab, no refills, read back and verified. RX  called to [facility] [phone].

## 2013-05-20 ENCOUNTER — Encounter: Payer: Self-pay | Admitting: Cardiology

## 2013-05-20 ENCOUNTER — Ambulatory Visit: Payer: Medicare Other | Admitting: Cardiology

## 2013-05-20 ENCOUNTER — Encounter: Payer: Self-pay | Admitting: *Deleted

## 2013-05-20 ENCOUNTER — Ambulatory Visit (INDEPENDENT_AMBULATORY_CARE_PROVIDER_SITE_OTHER): Payer: Medicare Other | Admitting: Cardiology

## 2013-05-20 VITALS — BP 120/88 | HR 87 | Ht 64.0 in | Wt 281.0 lb

## 2013-05-20 DIAGNOSIS — Z79899 Other long term (current) drug therapy: Secondary | ICD-10-CM

## 2013-05-20 DIAGNOSIS — R0602 Shortness of breath: Secondary | ICD-10-CM

## 2013-05-20 DIAGNOSIS — R0609 Other forms of dyspnea: Secondary | ICD-10-CM

## 2013-05-20 DIAGNOSIS — R9431 Abnormal electrocardiogram [ECG] [EKG]: Secondary | ICD-10-CM

## 2013-05-20 DIAGNOSIS — O903 Peripartum cardiomyopathy: Secondary | ICD-10-CM

## 2013-05-20 DIAGNOSIS — R0989 Other specified symptoms and signs involving the circulatory and respiratory systems: Secondary | ICD-10-CM

## 2013-05-20 DIAGNOSIS — I10 Essential (primary) hypertension: Secondary | ICD-10-CM

## 2013-05-20 DIAGNOSIS — R0683 Snoring: Secondary | ICD-10-CM

## 2013-05-20 LAB — BASIC METABOLIC PANEL
BUN: 19 mg/dL (ref 6–23)
CO2: 29 mEq/L (ref 19–32)
Calcium: 9.3 mg/dL (ref 8.4–10.5)
Chloride: 103 mEq/L (ref 96–112)
Creatinine, Ser: 1 mg/dL (ref 0.4–1.2)
GFR: 62.18 mL/min (ref >60.00)
Glucose, Bld: 100 mg/dL — ABNORMAL HIGH (ref 70–99)
Potassium: 4.3 mEq/L (ref 3.5–5.1)
Sodium: 142 mEq/L (ref 135–145)

## 2013-05-20 LAB — BRAIN NATRIURETIC PEPTIDE: Pro B Natriuretic peptide (BNP): 191 pg/mL — ABNORMAL HIGH (ref 0.0–100.0)

## 2013-05-20 MED ORDER — FUROSEMIDE 40 MG PO TABS: 40.0000 mg | ORAL_TABLET | Freq: Every day | ORAL | Status: DC

## 2013-05-20 MED ORDER — CARVEDILOL 6.25 MG PO TABS: 6.2500 mg | ORAL_TABLET | Freq: Two times a day (BID) | ORAL | Status: DC

## 2013-05-20 MED ORDER — AMLODIPINE BESYLATE 5 MG PO TABS: 5.0000 mg | ORAL_TABLET | Freq: Every day | ORAL | Status: DC

## 2013-05-20 MED ORDER — ENALAPRIL MALEATE 20 MG PO TABS: 20.0000 mg | ORAL_TABLET | Freq: Two times a day (BID) | ORAL | Status: DC

## 2013-05-20 MED ORDER — CARVEDILOL 25 MG PO TABS: 25.0000 mg | ORAL_TABLET | Freq: Two times a day (BID) | ORAL | Status: DC

## 2013-05-20 MED ORDER — POTASSIUM CHLORIDE CRYS ER 20 MEQ PO TBCR: 20.0000 meq | EXTENDED_RELEASE_TABLET | Freq: Every day | ORAL | Status: DC

## 2013-05-20 NOTE — Patient Instructions (Addendum)
The current medical regimen is effective;  continue present plan and medications.  Please have blood work today (BMP and BNP)  Your physician has requested that you have an echocardiogram. Echocardiography is a painless test that uses sound waves to create images of your heart. It provides your doctor with information about the size and shape of your heart and how well your heart's chambers and valves are working. This procedure takes approximately one hour. There are no restrictions for this procedure.  Your physician has recommended that you have a sleep study. This test records several body functions during sleep, including: brain activity, eye movement, oxygen and carbon dioxide blood levels, heart rate and rhythm, breathing rate and rhythm, the flow of air through your mouth and nose, snoring, body muscle movements, and chest and belly movement.  Follow up in 1 month with Margaret Pyle.

## 2013-05-20 NOTE — Progress Notes (Signed)
HPI The patient presents for evaluation of edema and palpitations.   She was in the ED recently with some mild dyspnea and possibly volume overload. She reports that this started with more GI symptoms. She would feel or after eating very little. He then had some shortness of breath. She had this a couple of times. It became so severe that on Monday she presented to the emergency room. There she was found to have a mildly elevated BNP level as well as edema on chest x-ray. She was treated with increased dose of diuretic. She did report that recently she had an episode of shortness of breath and some elevated blood pressures and received an IV medication at Dr. Ronnell Freshwater office. I don't have these records. Her blood pressure in the emergency room was okay. She says she still not breathing at baseline but better than she was the other day. Her weight is actually down from their peak. Her swelling is not excessive compared to previous. She's not having any new palpitations, presyncope or syncope. She did have some slight soreness to deep inspiration with all of this. She's trying to watch her salt. She's not describing any new PND or orthopnea currently.   Allergies  Allergen Reactions  . Duloxetine     REACTION: Sucidal thoughts  . Penicillins     REACTION: rash    Current Outpatient Prescriptions  Medication Sig Dispense Refill  . amLODipine (NORVASC) 5 MG tablet Take 5 mg by mouth daily.      [name] amphetamine-dextroamphetamine (ADDERALL XR) 30 MG 24 hr capsule Take 1 capsule (30 mg total) by mouth every morning.  30 capsule  0  . carvedilol (COREG) 25 MG tablet Take 25 mg by mouth 2 (two) times daily with a meal.      . carvedilol (COREG) 6.25 MG tablet Take 6.25 mg by mouth 2 (two) times daily with a meal.      . clindamycin (CLEOCIN T) 1 % lotion Apply 1 application topically as directed.      [name] dextromethorphan-guaiFENesin (TUSSIN DM) 10-100 MG/5ML liquid Take 10 mLs by mouth every 4 (four)  hours as needed for cough.      . enalapril (VASOTEC) 20 MG tablet Take 1 tablet (20 mg total) by mouth 2 (two) times daily.  180 tablet  3  . fluconazole (DIFLUCAN) 150 MG tablet Take 150 mg by mouth once.      . furosemide (LASIX) 20 MG tablet Take 1 tablet (20 mg total) by mouth daily.  90 tablet  3  . ibuprofen (ADVIL,MOTRIN) 800 MG tablet       . ipratropium (ATROVENT) 0.03 % nasal spray PRN      . lamoTRIgine (LAMICTAL) 200 MG tablet Take 200 mg by mouth daily.      . potassium chloride (KLOR-CON M10) 10 MEQ tablet Take 1 tablet (10 mEq total) by mouth daily.  90 tablet  3  . simvastatin (ZOCOR) 20 MG tablet Take 20 mg by mouth daily.       No current facility-administered medications for this visit.    Past Medical History  Diagnosis Date  . ADHD   . CARDIOMYOPATHY, PERIPARTUM, POSTPARTUM     a. EF previously 30-40%,  b.  resolved, EF 60%    c.  echo 10/2011:  mild LVH, EF 50%, mild LAE,   . MIGRAINE HEADACHE   . OBESITY   . DEPRESSION   . Essential hypertension, benign   . CARPAL TUNNEL SYNDROME, RIGHT   .  Anxiety   . Edema     a. LE venous dopplers negative for DVT 10/2011    Past Surgical History  Procedure Laterality Date  . Gastric bypass    . Child birth  2003  . Cardiomyopathy  2003   ROS:  Snoring, depression.  Otherwise as stated in the HPI and negative for all other systems.  PHYSICAL EXAM BP 120/88  Pulse 87  Ht 5\' 4"  (1.626 m)  Wt 281 lb (127.461 kg)  BMI 48.21 kg/m2 GENERAL:  Well appearing NECK:  No jugular venous distention, waveform within normal limits, carotid upstroke brisk and symmetric, no bruits, no thyromegaly LUNGS:  Clear to auscultation bilaterally BACK:  No CVA tenderness CHEST:  Unremarkable HEART:  PMI not displaced or sustained,S1 and S2 within normal limits, no S3, no S4, no clicks, no rubs, no murmurs ABD:  Flat, positive bowel sounds normal in frequency in pitch, no bruits, no rebound, no guarding, no midline pulsatile mass, no  hepatomegaly, no splenomegaly, obese EXT:  2 plus pulses throughout, mild to moderate left greater than right lower extremityedema, no cyanosis no clubbing  EKG:  Sinus rhythm, rate 87, premature ectopic complexes, QTC prolonged, poor anterior R wave progression.  05/20/2013  ASSESSMENT AND PLAN  EDEMA - I will continue the 40 mg of Lasix for now.    CARDIOMYOPATHY, PERIPARTUM, POSTPARTUM -  I will check an echocardiogram given the recent presentation.   ESSENTIAL HYPERTENSION, BENIGN -  The blood pressure is at target. No change in medications is indicated. We will continue with therapeutic lifestyle changes (TLC).  ANXIETY DEPRESSION - She reports increased depression.  This has been managed by TALBOT, DAVID Loletha Grayer, MD  SNORING/APNEA - I will order a sleep study.

## 2013-06-08 ENCOUNTER — Ambulatory Visit (HOSPITAL_COMMUNITY): Payer: Medicare Other | Attending: Cardiovascular Disease | Admitting: Cardiology

## 2013-06-08 ENCOUNTER — Other Ambulatory Visit (HOSPITAL_COMMUNITY): Payer: Self-pay | Admitting: Cardiology

## 2013-06-08 DIAGNOSIS — O903 Peripartum cardiomyopathy: Secondary | ICD-10-CM

## 2013-06-08 DIAGNOSIS — I429 Cardiomyopathy, unspecified: Secondary | ICD-10-CM

## 2013-06-08 DIAGNOSIS — R0601 Orthopnea: Secondary | ICD-10-CM

## 2013-06-08 DIAGNOSIS — R9431 Abnormal electrocardiogram [ECG] [EKG]: Secondary | ICD-10-CM

## 2013-06-08 NOTE — Progress Notes (Signed)
Echo performed. 

## 2013-06-16 ENCOUNTER — Encounter: Payer: Self-pay | Admitting: *Deleted

## 2013-06-16 ENCOUNTER — Telehealth: Payer: Self-pay | Admitting: Cardiology

## 2013-06-16 NOTE — Telephone Encounter (Signed)
New problem         Pt needs a call back she is having problems with her teeth and would like to know what she should do 2 of her fillings fell out one down to the nerve.  Pt stated she has Cardiomyopathy and usually she needs a letter for the DDS. She is in a lot of pain.    Pt is scheduled for a Colonoscopy in May and she may go under, does she need to cancel that and reschedule it.     Please give her a call.

## 2013-06-16 NOTE — Telephone Encounter (Signed)
Pt aware she may have dental work and colonoscopy without restriction.  She is requesting another letter stating she can have dental work which will be completed and mailed to her home address today.

## 2013-06-21 ENCOUNTER — Telehealth: Payer: Self-pay | Admitting: Cardiology

## 2013-06-21 NOTE — Telephone Encounter (Signed)
Printed copy of letter from 4/22 stating this information but hand wrote OK to take medications and OK to have X-rays.  Left message for pt this as been completed.  Letter taken to MR to be faxed.

## 2013-06-21 NOTE — Telephone Encounter (Signed)
New Message  Pt called states that she has been sent a letter for cardiac clearance for dental proceedure. Pt has an appt on 4/28 at 9 am . Pt states Dr. Juleen China is requesting a new letter confirming she may take all of her med's without adjustment and that no pre-meds are needed. The letter will also need to state that it is [facility] for her to have X rays. Please call to confirm that the letter has been faxed.   Fax # [phone] Attn; Dr. Eugene Gavia

## 2013-06-22 ENCOUNTER — Encounter: Payer: Self-pay | Admitting: Cardiology

## 2013-06-22 ENCOUNTER — Ambulatory Visit: Payer: Medicare Other | Admitting: Physician Assistant

## 2013-06-22 ENCOUNTER — Ambulatory Visit (INDEPENDENT_AMBULATORY_CARE_PROVIDER_SITE_OTHER): Payer: Medicare Other | Admitting: Cardiology

## 2013-06-22 VITALS — BP 152/92 | HR 89 | Ht 64.0 in | Wt 280.3 lb

## 2013-06-22 DIAGNOSIS — O903 Peripartum cardiomyopathy: Secondary | ICD-10-CM

## 2013-06-22 MED ORDER — CARVEDILOL 12.5 MG PO TABS: 12.5000 mg | ORAL_TABLET | Freq: Two times a day (BID) | ORAL | Status: DC

## 2013-06-22 NOTE — Patient Instructions (Signed)
Please increase your Carvedilol to 37.5 mg twice a day. Continue all other medications as listed.  Follow up in 2 months with Dr Percival Spanish.

## 2013-06-22 NOTE — Progress Notes (Signed)
HPI The patient presents for evaluation of edema and palpitations and cardiomyopathy.   Recently she was in the emergency room with heart failure symptoms. I sent her echocardiogram which didn't demonstrate her ejection fraction to be about 30%. The last one was probably about 50%. When I first started treating her her EF was 30%. She's had no acute chest pain or other symptoms. She does think that her breathing is better with Lasix if she has been taking. She denies any further PND or orthopnea. She's had no new palpitations, presyncope or syncope. She's had no PND though she had some transient left leg edema recently. As usual she is under a lot of stress.    Allergies  Allergen Reactions  . Duloxetine     REACTION: Sucidal thoughts  . Penicillins     REACTION: rash    Current Outpatient Prescriptions  Medication Sig Dispense Refill  . amLODipine (NORVASC) 5 MG tablet Take 1 tablet (5 mg total) by mouth daily.  90 tablet  3  . amphetamine-dextroamphetamine (ADDERALL XR) 30 MG 24 hr capsule Take 1 capsule (30 mg total) by mouth every morning.  30 capsule  0  . carvedilol (COREG) 25 MG tablet Take 1 tablet (25 mg total) by mouth 2 (two) times daily with a meal.  180 tablet  3  . carvedilol (COREG) 6.25 MG tablet Take 1 tablet (6.25 mg total) by mouth 2 (two) times daily with a meal.  180 tablet  3  . clindamycin (CLEOCIN T) 1 % lotion Apply 1 application topically as directed.      . clonazePAM (KLONOPIN) 1 MG tablet Take 1 mg by mouth as needed.      . enalapril (VASOTEC) 20 MG tablet Take 1 tablet (20 mg total) by mouth 2 (two) times daily.  180 tablet  3  . fluconazole (DIFLUCAN) 150 MG tablet Take 150 mg by mouth once.      . furosemide (LASIX) 40 MG tablet Take 1 tablet (40 mg total) by mouth daily.  90 tablet  3  . ibuprofen (ADVIL,MOTRIN) 800 MG tablet       . ipratropium (ATROVENT) 0.03 % nasal spray PRN      . lamoTRIgine (LAMICTAL) 200 MG tablet Take 200 mg by mouth daily.        . potassium chloride SA (KLOR-CON M10) 20 MEQ tablet Take 1 tablet (20 mEq total) by mouth daily.  90 tablet  3  . simvastatin (ZOCOR) 20 MG tablet Take 20 mg by mouth daily.       No current facility-administered medications for this visit.    Past Medical History  Diagnosis Date  . ADHD   . CARDIOMYOPATHY, PERIPARTUM, POSTPARTUM     a. EF previously 30-40%,  b.  resolved, EF 60%    c.  echo 10/2011:  mild LVH, EF 50%, mild LAE,   . MIGRAINE HEADACHE   . OBESITY   . DEPRESSION   . Essential hypertension, benign   . CARPAL TUNNEL SYNDROME, RIGHT   . Anxiety   . Edema     a. LE venous dopplers negative for DVT 10/2011    Past Surgical History  Procedure Laterality Date  . Child birth  2003  . Cholecystectomy     ROS:  Snoring, depression, anxiety.  Otherwise as stated in the HPI and negative for all other systems.  PHYSICAL EXAM BP 152/92  Pulse 89  Ht 5\' 4"  (1.626 m)  Wt 280 lb 4.8 oz (  127.143 kg)  BMI 48.09 kg/m2 GENERAL:  Well appearing NECK:  No jugular venous distention, waveform within normal limits, carotid upstroke brisk and symmetric, no bruits, no thyromegaly LUNGS:  Clear to auscultation bilaterally BACK:  No CVA tenderness CHEST:  Unremarkable HEART:  PMI not displaced or sustained,S1 and S2 within normal limits, no S3, no S4, no clicks, no rubs, no murmurs ABD:  Flat, positive bowel sounds normal in frequency in pitch, no bruits, no rebound, no guarding, no midline pulsatile mass, no hepatomegaly, no splenomegaly, obese EXT:  2 plus pulses throughout, mild to moderate left greater than right lower extremityedema, no cyanosis no clubbing  ASSESSMENT AND PLAN  EDEMA - I will continue the 40 mg of Lasix for now and other meds as listed.    CARDIOMYOPATHY, PERIPARTUM, POSTPARTUM -   her ejection fraction is back down to 30% which was what was previously. I am going to continue to titrate her medications by increasing her beta blocker. She will be getting a  sleep study as below. At this point I do not think repeat ischemia study is  Indicated that she's had no symptoms consistent with an ischemic etiology.  And no risk factors.    ESSENTIAL HYPERTENSION, BENIGN -  This is being managed in the context of treating his CHF  ANXIETY DEPRESSION - This has been managed by TALBOT, DAVID C, MD.  We again spent a long time talking about this.    SNORING/APNEA - She was scheduled for a sleep study but had to cancel this.  This is important and she will reschedule.     (Greater than 40 minutes reviewing all data with greater than 50% face to face with the patient).

## 2013-06-24 ENCOUNTER — Encounter (HOSPITAL_BASED_OUTPATIENT_CLINIC_OR_DEPARTMENT_OTHER): Payer: Medicare Other

## 2013-07-09 ENCOUNTER — Encounter (HOSPITAL_BASED_OUTPATIENT_CLINIC_OR_DEPARTMENT_OTHER): Payer: Medicare Other

## 2013-07-14 ENCOUNTER — Telehealth: Payer: Self-pay | Admitting: Cardiology

## 2013-07-14 NOTE — Telephone Encounter (Signed)
New Message  Jasmine with [facility] with [facility] rehab states that they have received a referal via fax. They cannot use the Diagnosis code.. please call back to change the code.

## 2013-07-14 NOTE — Telephone Encounter (Signed)
Spoke with [place] from rehab at [facility].  She needs DX code to be changed to SOB which she does carry.  She will fax the form back so that it can be changed.

## 2013-07-16 NOTE — Telephone Encounter (Signed)
Paperwork received, corrected and given to MR to be faxed back to [facility].

## 2013-07-28 ENCOUNTER — Telehealth: Payer: Self-pay | Admitting: Cardiology

## 2013-08-11 ENCOUNTER — Ambulatory Visit: Payer: Medicare Other | Admitting: Cardiology

## 2013-08-13 ENCOUNTER — Other Ambulatory Visit: Payer: Self-pay | Admitting: *Deleted

## 2013-08-13 DIAGNOSIS — Z79899 Other long term (current) drug therapy: Secondary | ICD-10-CM

## 2013-08-13 DIAGNOSIS — I10 Essential (primary) hypertension: Secondary | ICD-10-CM

## 2013-08-13 NOTE — Telephone Encounter (Signed)
Will send to refills to follow up - RX was send into pharmacy as Brand name only.    According to lab work [name] had requested pt have repeat BMP back in 05/2013 and I don't see where it was done unless it was done somewhere else. Rose - please ask her if she has had bloodwork anywhere else.

## 2013-08-13 NOTE — Telephone Encounter (Signed)
New Message   Pt called states that she is allergic to the off brand coreg can she get the name brand version And will she need a lab before 11/08/2013////SR

## 2013-11-08 ENCOUNTER — Ambulatory Visit (INDEPENDENT_AMBULATORY_CARE_PROVIDER_SITE_OTHER): Payer: Medicare Other | Admitting: Cardiology

## 2013-11-08 ENCOUNTER — Encounter: Payer: Self-pay | Admitting: Cardiology

## 2013-11-08 ENCOUNTER — Telehealth: Payer: Self-pay | Admitting: Cardiology

## 2013-11-08 VITALS — BP 148/90 | HR 90 | Ht 64.0 in | Wt 294.7 lb

## 2013-11-08 DIAGNOSIS — E782 Mixed hyperlipidemia: Secondary | ICD-10-CM

## 2013-11-08 DIAGNOSIS — R0989 Other specified symptoms and signs involving the circulatory and respiratory systems: Secondary | ICD-10-CM

## 2013-11-08 DIAGNOSIS — I10 Essential (primary) hypertension: Secondary | ICD-10-CM

## 2013-11-08 DIAGNOSIS — R0602 Shortness of breath: Secondary | ICD-10-CM

## 2013-11-08 DIAGNOSIS — Z79899 Other long term (current) drug therapy: Secondary | ICD-10-CM

## 2013-11-08 DIAGNOSIS — O903 Peripartum cardiomyopathy: Secondary | ICD-10-CM

## 2013-11-08 DIAGNOSIS — R5383 Other fatigue: Secondary | ICD-10-CM

## 2013-11-08 DIAGNOSIS — R5381 Other malaise: Secondary | ICD-10-CM

## 2013-11-08 LAB — COMPLETE METABOLIC PANEL WITH GFR
ALT: 16 U/L (ref 0–35)
AST: 16 U/L (ref 0–37)
Albumin: 3.8 g/dL (ref 3.5–5.2)
Alkaline Phosphatase: 33 U/L — ABNORMAL LOW (ref 39–117)
BUN: 7 mg/dL (ref 6–23)
CO2: 25 mEq/L (ref 19–32)
Calcium: 8.7 mg/dL (ref 8.4–10.5)
Chloride: 103 mEq/L (ref 96–112)
Creat: 0.53 mg/dL (ref 0.50–1.10)
GFR, Est African American: >89 mL/min
GFR, Est Non African American: >89 mL/min
Glucose, Bld: 92 mg/dL (ref 70–99)
Potassium: 4.1 mEq/L (ref 3.5–5.3)
Sodium: 140 mEq/L (ref 135–145)
Total Bilirubin: 0.5 mg/dL (ref 0.2–1.2)
Total Protein: 6.5 g/dL (ref 6.0–8.3)

## 2013-11-08 LAB — LIPID PANEL
Cholesterol: 155 mg/dL (ref 0–200)
HDL: 47 mg/dL (ref >39)
LDL Cholesterol: 78 mg/dL (ref 0–99)
Total CHOL/HDL Ratio: 3.3 Ratio
Triglycerides: 148 mg/dL (ref <150)
VLDL: 30 mg/dL (ref 0–40)

## 2013-11-08 LAB — TSH: TSH: 1.496 u[IU]/mL (ref 0.350–4.500)

## 2013-11-08 MED ORDER — SIMVASTATIN 20 MG PO TABS: 20.0000 mg | ORAL_TABLET | Freq: Every day | ORAL | Status: DC

## 2013-11-08 MED ORDER — FUROSEMIDE 40 MG PO TABS: 40.0000 mg | ORAL_TABLET | Freq: Every day | ORAL | Status: DC

## 2013-11-08 MED ORDER — ENALAPRIL MALEATE 20 MG PO TABS: 20.0000 mg | ORAL_TABLET | Freq: Two times a day (BID) | ORAL | Status: DC

## 2013-11-08 MED ORDER — POTASSIUM CHLORIDE CRYS ER 20 MEQ PO TBCR: 20.0000 meq | EXTENDED_RELEASE_TABLET | Freq: Every day | ORAL | Status: DC

## 2013-11-08 NOTE — Telephone Encounter (Signed)
Returned call to patient she stated lab cancelled.

## 2013-11-08 NOTE — Telephone Encounter (Signed)
Pt called in stating that she is downstairs at [facility] and there were not labs put in for her and she has an appt with Dr. Percival Spanish at 9:15am today. Please send labs ASAP.   Thanks

## 2013-11-08 NOTE — Patient Instructions (Signed)
Your physician recommends that you schedule a follow-up appointment in:  6 months  Have your labs done today  We are ordering an Echo in 6 months

## 2013-11-08 NOTE — Progress Notes (Signed)
HPI The patient presents for evaluation of edema and palpitations and cardiomyopathy.  Since last saw her she continues to have problems with depression more than any other issue it seems. She does get short of breath walking a moderate distance on level ground and her weight is up slightly but her swelling is less than previous. She's had no acute exacerbations of her CHF. She denies any chest pressure, neck or arm discomfort. She does need to have apparently a procedure for uterine polyps. She also did not schedule her colonoscopy and didn't follow through with the sleep study.  Allergies  Allergen Reactions  . Duloxetine     REACTION: Sucidal thoughts  . Penicillins     REACTION: rash    Current Outpatient Prescriptions  Medication Sig Dispense Refill  . amLODipine (NORVASC) 5 MG tablet Take 10 mg by mouth daily.      [name] amphetamine-dextroamphetamine (ADDERALL XR) 30 MG 24 hr capsule Take 1 capsule (30 mg total) by mouth every morning.  30 capsule  0  . carvedilol (COREG) 12.5 MG tablet Take 1 tablet (12.5 mg total) by mouth 2 (two) times daily with a meal.  180 tablet  3  . carvedilol (COREG) 25 MG tablet Take 1 tablet (25 mg total) by mouth 2 (two) times daily with a meal.  180 tablet  3  . clindamycin (CLEOCIN T) 1 % lotion Apply 1 application topically as directed.      . clindamycin (CLEOCIN) 300 MG capsule Take 300 mg by mouth as needed.       . clonazePAM (KLONOPIN) 1 MG tablet Take 1 mg by mouth as needed.      . enalapril (VASOTEC) 20 MG tablet Take 1 tablet (20 mg total) by mouth 2 (two) times daily.  180 tablet  3  . furosemide (LASIX) 40 MG tablet Take 1 tablet (40 mg total) by mouth daily.  90 tablet  3  . hydrocortisone valerate ointment (WEST-CORT) 0.2 %       . ibuprofen (ADVIL,MOTRIN) 800 MG tablet Take 800 mg by mouth as needed.       [name] ipratropium (ATROVENT) 0.03 % nasal spray 1 spray. PRN      . lamoTRIgine (LAMICTAL) 200 MG tablet Take 200 mg by mouth daily.        . potassium chloride SA (KLOR-CON M10) 20 MEQ tablet Take 1 tablet (20 mEq total) by mouth daily.  90 tablet  3  . simvastatin (ZOCOR) 20 MG tablet Take 20 mg by mouth daily.       No current facility-administered medications for this visit.    Past Medical History  Diagnosis Date  . ADHD   . CARDIOMYOPATHY, PERIPARTUM, POSTPARTUM     a. EF previously 30-40%,  b.  resolved, EF 60%    c.  echo 10/2011:  mild LVH, EF 50%, mild LAE,   . MIGRAINE HEADACHE   . OBESITY   . DEPRESSION   . Essential hypertension, benign   . CARPAL TUNNEL SYNDROME, RIGHT   . Anxiety   . Edema     a. LE venous dopplers negative for DVT 10/2011    Past Surgical History  Procedure Laterality Date  . Child birth  2003  . Cholecystectomy     ROS:  As stated in the HPI and negative for all other systems.  PHYSICAL EXAM BP 148/90  Pulse 90  Ht 5\' 4"  (1.626 m)  Wt 294 lb 11.2 oz (133.675 kg)  BMI  50.56 kg/m2 GENERAL:  Well appearing NECK:  No jugular venous distention, waveform within normal limits, carotid upstroke brisk and symmetric, no bruits, no thyromegaly LUNGS:  Clear to auscultation bilaterally BACK:  No CVA tenderness CHEST:  Unremarkable HEART:  PMI not displaced or sustained,S1 and S2 within normal limits, no S3, no S4, no clicks, no rubs, no murmurs ABD:  Flat, positive bowel sounds normal in frequency in pitch, no bruits, no rebound, no guarding, no midline pulsatile mass, no hepatomegaly, no splenomegaly, obese EXT:  2 plus pulses throughout, mild to trace left greater than right lower extremity edema, no cyanosis no clubbing NEURO:  Tearful  EKG:  Sinus rhythm, rate 71, axis within normal limits, intervals within normal limits, premature ectopic complexes, poor anterior R wave progression.  11/08/2013   ASSESSMENT AND PLAN  EDEMA - I will continue the 40 mg of Lasix for now and other meds as listed.  She will get a BMET today.  We discussed lower salt as she has not been completely  compliant.    CARDIOMYOPATHY, PERIPARTUM, POSTPARTUM -  Her ejection fraction is 30%. She is at target with her meds.  I would like for her to have a sleep study but she hasn't scheduled this. I do think she seems to be euvolemic at this point.  ESSENTIAL HYPERTENSION, BENIGN -  This is being managed in the context of treating his CHF.  Her BP is slightly elevated but she did just have her Norvasc increased.   ANXIETY DEPRESSION - This has been managed by TALBOT, DAVID C, MD.  She is very depressed but she has no active suicidal thoughts per our conversation.  However, she should have active follow up with her psychiatrist.    Estanislado Emms - She was scheduled for a sleep study but had to cancel this.  This is important and she will reschedule.    PREOP -   She says that she might be having a GYN procedure. I don't have the details on this might require general anesthesia. She's not in overt heart failure though she does have a cardiomyopathy. Provided it is a low blood loss expected for this procedure she would not be high risk based on current ACC/AHA guidelines that she would have to have close follow up of her respiratory status given presumed sleep apnea, morbid obesity and her nonischemic cardiomyopathy.  I think these things would make her moderate risk for respiratory complications or arrhythmias post procedure and she would be at risk for volume overload and would need very careful attention to fluid management procedure.

## 2013-11-09 LAB — BRAIN NATRIURETIC PEPTIDE: Brain Natriuretic Peptide: 156.1 pg/mL — ABNORMAL HIGH (ref 0.0–100.0)

## 2013-11-26 ENCOUNTER — Inpatient Hospital Stay (HOSPITAL_COMMUNITY)
Admission: EM | Admit: 2013-11-26 | Discharge: 2013-11-28 | DRG: 292 | Disposition: A | Discharge status: Home / Self Care | Payer: Medicare Other | Attending: Cardiology | Admitting: Cardiology

## 2013-11-26 ENCOUNTER — Encounter (HOSPITAL_COMMUNITY): Payer: Self-pay | Admitting: Emergency Medicine

## 2013-11-26 DIAGNOSIS — Z91148 Patient's other noncompliance with medication regimen for other reason: Secondary | ICD-10-CM

## 2013-11-26 DIAGNOSIS — I5021 Acute systolic (congestive) heart failure: Secondary | ICD-10-CM

## 2013-11-26 DIAGNOSIS — I110 Hypertensive heart disease with heart failure: Principal | ICD-10-CM | POA: Diagnosis present

## 2013-11-26 DIAGNOSIS — Z880 Allergy status to penicillin: Secondary | ICD-10-CM

## 2013-11-26 DIAGNOSIS — I428 Other cardiomyopathies: Secondary | ICD-10-CM | POA: Diagnosis present

## 2013-11-26 DIAGNOSIS — Z6841 Body Mass Index (BMI) 40.0 and over, adult: Secondary | ICD-10-CM

## 2013-11-26 DIAGNOSIS — I5023 Acute on chronic systolic (congestive) heart failure: Secondary | ICD-10-CM | POA: Diagnosis present

## 2013-11-26 DIAGNOSIS — I5043 Acute on chronic combined systolic (congestive) and diastolic (congestive) heart failure: Secondary | ICD-10-CM | POA: Diagnosis present

## 2013-11-26 DIAGNOSIS — O903 Peripartum cardiomyopathy: Secondary | ICD-10-CM

## 2013-11-26 DIAGNOSIS — I10 Essential (primary) hypertension: Secondary | ICD-10-CM

## 2013-11-26 DIAGNOSIS — E1169 Type 2 diabetes mellitus with other specified complication: Secondary | ICD-10-CM | POA: Diagnosis present

## 2013-11-26 DIAGNOSIS — E876 Hypokalemia: Secondary | ICD-10-CM | POA: Diagnosis present

## 2013-11-26 DIAGNOSIS — I517 Cardiomegaly: Secondary | ICD-10-CM | POA: Diagnosis present

## 2013-11-26 DIAGNOSIS — F418 Other specified anxiety disorders: Secondary | ICD-10-CM | POA: Diagnosis present

## 2013-11-26 DIAGNOSIS — Z888 Allergy status to other drugs, medicaments and biological substances status: Secondary | ICD-10-CM

## 2013-11-26 DIAGNOSIS — F329 Major depressive disorder, single episode, unspecified: Secondary | ICD-10-CM | POA: Diagnosis present

## 2013-11-26 DIAGNOSIS — E785 Hyperlipidemia, unspecified: Secondary | ICD-10-CM | POA: Diagnosis present

## 2013-11-26 DIAGNOSIS — Z87891 Personal history of nicotine dependence: Secondary | ICD-10-CM

## 2013-11-26 DIAGNOSIS — E1159 Type 2 diabetes mellitus with other circulatory complications: Secondary | ICD-10-CM | POA: Diagnosis present

## 2013-11-26 DIAGNOSIS — O94 Sequelae of complication of pregnancy, childbirth, and the puerperium: Secondary | ICD-10-CM

## 2013-11-26 DIAGNOSIS — Z9114 Patient's other noncompliance with medication regimen: Secondary | ICD-10-CM | POA: Diagnosis present

## 2013-11-26 DIAGNOSIS — F908 Attention-deficit hyperactivity disorder, other type: Secondary | ICD-10-CM

## 2013-11-26 DIAGNOSIS — F909 Attention-deficit hyperactivity disorder, unspecified type: Secondary | ICD-10-CM | POA: Diagnosis present

## 2013-11-26 DIAGNOSIS — Z79899 Other long term (current) drug therapy: Secondary | ICD-10-CM

## 2013-11-26 HISTORY — DX: Heart failure, unspecified: I50.9

## 2013-11-26 LAB — BASIC METABOLIC PANEL
Anion gap: 13 (ref 5–15)
BUN: 12 mg/dL (ref 6–23)
CO2: 27 mEq/L (ref 19–32)
Calcium: 9.2 mg/dL (ref 8.4–10.5)
Chloride: 100 mEq/L (ref 96–112)
Creatinine, Ser: 0.56 mg/dL (ref 0.50–1.10)
GFR calc Af Amer: >90 mL/min (ref >90)
GFR calc non Af Amer: >90 mL/min (ref >90)
Glucose, Bld: 104 mg/dL — ABNORMAL HIGH (ref 70–99)
Potassium: 3.4 mEq/L — ABNORMAL LOW (ref 3.7–5.3)
Sodium: 140 mEq/L (ref 137–147)

## 2013-11-26 LAB — CBC
HCT: 40 % (ref 36.0–46.0)
Hemoglobin: 13.5 g/dL (ref 12.0–15.0)
MCH: 28.1 pg (ref 26.0–34.0)
MCHC: 33.8 g/dL (ref 30.0–36.0)
MCV: 83.2 fL (ref 78.0–100.0)
Platelets: 274 10*3/uL (ref 150–400)
RBC: 4.81 MIL/uL (ref 3.87–5.11)
RDW: 15.2 % (ref 11.5–15.5)
WBC: 10.7 10*3/uL — ABNORMAL HIGH (ref 4.0–10.5)

## 2013-11-26 LAB — PRO B NATRIURETIC PEPTIDE: Pro B Natriuretic peptide (BNP): 1785 pg/mL — ABNORMAL HIGH (ref 0–125)

## 2013-11-26 LAB — I-STAT TROPONIN, ED: Troponin i, poc: 0.01 ng/mL (ref 0.00–0.08)

## 2013-11-26 NOTE — ED Notes (Signed)
Patient here with numerous complaints. Most concerning is her complaints of shortness of breath and chest pain. States history of cardiomyopathy. Has had ongoing shortness of breath for several days now. Chest pain has been intermittent. Also explains she has been under a lot of stress and sometimes that will cause her to have problems like this as she is prone to Anxiety. Additionally reports that her doctors changed her ADD medications around 11/02/13 and she hasn't been able to get the medications filled since that time, and she has been without those.

## 2013-11-27 ENCOUNTER — Emergency Department (HOSPITAL_COMMUNITY): Payer: Medicare Other

## 2013-11-27 ENCOUNTER — Encounter (HOSPITAL_COMMUNITY): Payer: Self-pay | Admitting: General Practice

## 2013-11-27 DIAGNOSIS — I5023 Acute on chronic systolic (congestive) heart failure: Secondary | ICD-10-CM | POA: Diagnosis present

## 2013-11-27 DIAGNOSIS — F418 Other specified anxiety disorders: Secondary | ICD-10-CM | POA: Diagnosis present

## 2013-11-27 DIAGNOSIS — E876 Hypokalemia: Secondary | ICD-10-CM | POA: Diagnosis present

## 2013-11-27 DIAGNOSIS — Z87891 Personal history of nicotine dependence: Secondary | ICD-10-CM | POA: Diagnosis not present

## 2013-11-27 DIAGNOSIS — F419 Anxiety disorder, unspecified: Secondary | ICD-10-CM | POA: Diagnosis present

## 2013-11-27 DIAGNOSIS — Z79899 Other long term (current) drug therapy: Secondary | ICD-10-CM | POA: Diagnosis not present

## 2013-11-27 DIAGNOSIS — Z6841 Body Mass Index (BMI) 40.0 and over, adult: Secondary | ICD-10-CM | POA: Diagnosis not present

## 2013-11-27 DIAGNOSIS — I5021 Acute systolic (congestive) heart failure: Secondary | ICD-10-CM | POA: Diagnosis present

## 2013-11-27 DIAGNOSIS — I428 Other cardiomyopathies: Secondary | ICD-10-CM | POA: Diagnosis present

## 2013-11-27 DIAGNOSIS — I10 Essential (primary) hypertension: Secondary | ICD-10-CM

## 2013-11-27 DIAGNOSIS — I5043 Acute on chronic combined systolic (congestive) and diastolic (congestive) heart failure: Secondary | ICD-10-CM | POA: Diagnosis present

## 2013-11-27 DIAGNOSIS — F909 Attention-deficit hyperactivity disorder, unspecified type: Secondary | ICD-10-CM | POA: Diagnosis present

## 2013-11-27 DIAGNOSIS — Z880 Allergy status to penicillin: Secondary | ICD-10-CM | POA: Diagnosis not present

## 2013-11-27 DIAGNOSIS — E785 Hyperlipidemia, unspecified: Secondary | ICD-10-CM | POA: Diagnosis present

## 2013-11-27 DIAGNOSIS — I517 Cardiomegaly: Secondary | ICD-10-CM | POA: Diagnosis present

## 2013-11-27 DIAGNOSIS — Z9114 Patient's other noncompliance with medication regimen: Secondary | ICD-10-CM | POA: Diagnosis present

## 2013-11-27 DIAGNOSIS — Z888 Allergy status to other drugs, medicaments and biological substances status: Secondary | ICD-10-CM | POA: Diagnosis not present

## 2013-11-27 DIAGNOSIS — F329 Major depressive disorder, single episode, unspecified: Secondary | ICD-10-CM | POA: Diagnosis present

## 2013-11-27 DIAGNOSIS — I429 Cardiomyopathy, unspecified: Secondary | ICD-10-CM

## 2013-11-27 DIAGNOSIS — I110 Hypertensive heart disease with heart failure: Secondary | ICD-10-CM | POA: Diagnosis present

## 2013-11-27 LAB — MAGNESIUM: Magnesium: 1.7 mg/dL (ref 1.5–2.5)

## 2013-11-27 LAB — TROPONIN I
Troponin I: <0.3 ng/mL (ref <0.30)
Troponin I: <0.3 ng/mL (ref <0.30)

## 2013-11-27 MED ORDER — SODIUM CHLORIDE 0.9 % IJ SOLN
3.0000 mL | Freq: Two times a day (BID) | INTRAMUSCULAR | Status: DC
  Administered 2013-11-27 – 2013-11-28 (×3): 3 mL via INTRAVENOUS

## 2013-11-27 MED ORDER — LORAZEPAM 1 MG PO TABS
1.0000 mg | ORAL_TABLET | Freq: Once | ORAL | Status: AC
  Administered 2013-11-27: 1 mg via ORAL
  Filled 2013-11-27: qty 1

## 2013-11-27 MED ORDER — CARVEDILOL 12.5 MG PO TABS
12.5000 mg | ORAL_TABLET | Freq: Two times a day (BID) | ORAL | Status: DC
  Administered 2013-11-27 – 2013-11-28 (×3): 12.5 mg via ORAL
  Filled 2013-11-27 (×5): qty 1

## 2013-11-27 MED ORDER — AMPHETAMINE-DEXTROAMPHET ER 30 MG PO CP24: 30.0000 mg | ORAL_CAPSULE | Freq: Every day | ORAL | Status: DC

## 2013-11-27 MED ORDER — AMPHETAMINE-DEXTROAMPHET ER 10 MG PO CP24
30.0000 mg | ORAL_CAPSULE | Freq: Every day | ORAL | Status: DC
  Administered 2013-11-27 – 2013-11-28 (×2): 30 mg via ORAL
  Filled 2013-11-27 (×2): qty 3

## 2013-11-27 MED ORDER — ENALAPRIL MALEATE 20 MG PO TABS
20.0000 mg | ORAL_TABLET | Freq: Two times a day (BID) | ORAL | Status: DC
  Administered 2013-11-27 – 2013-11-28 (×3): 20 mg via ORAL
  Filled 2013-11-27 (×4): qty 1

## 2013-11-27 MED ORDER — SODIUM CHLORIDE 0.9 % IJ SOLN: 3.0000 mL | INTRAMUSCULAR | Status: DC | PRN

## 2013-11-27 MED ORDER — CARVEDILOL 25 MG PO TABS
37.5000 mg | ORAL_TABLET | Freq: Two times a day (BID) | ORAL | Status: DC
  Filled 2013-11-27 (×3): qty 1

## 2013-11-27 MED ORDER — SODIUM CHLORIDE 0.9 % IV SOLN: 250.0000 mL | INTRAVENOUS | Status: DC | PRN

## 2013-11-27 MED ORDER — INFLUENZA VAC SPLIT QUAD 0.5 ML IM SUSY
0.5000 mL | PREFILLED_SYRINGE | INTRAMUSCULAR | Status: AC
  Administered 2013-11-27: 0.5 mL via INTRAMUSCULAR
  Filled 2013-11-27: qty 0.5

## 2013-11-27 MED ORDER — FUROSEMIDE 40 MG PO TABS
40.0000 mg | ORAL_TABLET | Freq: Every day | ORAL | Status: DC
  Administered 2013-11-28: 40 mg via ORAL
  Filled 2013-11-27: qty 1

## 2013-11-27 MED ORDER — CLONAZEPAM 0.5 MG PO TABS
1.0000 mg | ORAL_TABLET | Freq: Three times a day (TID) | ORAL | Status: DC | PRN
  Administered 2013-11-27 (×2): 1 mg via ORAL
  Filled 2013-11-27 (×2): qty 2

## 2013-11-27 MED ORDER — LAMOTRIGINE 200 MG PO TABS
200.0000 mg | ORAL_TABLET | Freq: Every day | ORAL | Status: DC
  Administered 2013-11-27 – 2013-11-28 (×2): 200 mg via ORAL
  Filled 2013-11-27 (×3): qty 1

## 2013-11-27 MED ORDER — POTASSIUM CHLORIDE CRYS ER 20 MEQ PO TBCR
40.0000 meq | EXTENDED_RELEASE_TABLET | Freq: Once | ORAL | Status: AC
Start: 2013-11-27 — End: 2013-11-27
  Administered 2013-11-27: 40 meq via ORAL
  Filled 2013-11-27: qty 2

## 2013-11-27 MED ORDER — POTASSIUM CHLORIDE CRYS ER 20 MEQ PO TBCR
40.0000 meq | EXTENDED_RELEASE_TABLET | Freq: Once | ORAL | Status: AC
  Administered 2013-11-27: 40 meq via ORAL
  Filled 2013-11-27: qty 2

## 2013-11-27 MED ORDER — ONDANSETRON HCL 4 MG/2ML IJ SOLN: 4.0000 mg | Freq: Four times a day (QID) | INTRAMUSCULAR | Status: DC | PRN

## 2013-11-27 MED ORDER — SIMVASTATIN 20 MG PO TABS
20.0000 mg | ORAL_TABLET | Freq: Every day | ORAL | Status: DC
  Administered 2013-11-27 – 2013-11-28 (×2): 20 mg via ORAL
  Filled 2013-11-27 (×2): qty 1

## 2013-11-27 MED ORDER — ACETAMINOPHEN 325 MG PO TABS: 650.0000 mg | ORAL_TABLET | ORAL | Status: DC | PRN

## 2013-11-27 MED ORDER — AMLODIPINE BESYLATE 10 MG PO TABS
10.0000 mg | ORAL_TABLET | Freq: Every day | ORAL | Status: DC
  Administered 2013-11-27 – 2013-11-28 (×2): 10 mg via ORAL
  Filled 2013-11-27 (×2): qty 1

## 2013-11-27 MED ORDER — FUROSEMIDE 10 MG/ML IJ SOLN
80.0000 mg | Freq: Once | INTRAMUSCULAR | Status: AC
Start: 2013-11-27 — End: 2013-11-27
  Administered 2013-11-27: 80 mg via INTRAVENOUS
  Filled 2013-11-27: qty 8

## 2013-11-27 MED ORDER — FUROSEMIDE 10 MG/ML IJ SOLN
40.0000 mg | Freq: Two times a day (BID) | INTRAMUSCULAR | Status: AC
  Administered 2013-11-27 (×2): 40 mg via INTRAVENOUS
  Filled 2013-11-27 (×3): qty 4

## 2013-11-27 MED ORDER — MAGNESIUM SULFATE 40 MG/ML IJ SOLN
2.0000 g | Freq: Once | INTRAMUSCULAR | Status: AC
  Administered 2013-11-27: 2 g via INTRAVENOUS
  Filled 2013-11-27: qty 50

## 2013-11-27 MED ORDER — HEPARIN SODIUM (PORCINE) 5000 UNIT/ML IJ SOLN
5000.0000 [IU] | Freq: Three times a day (TID) | INTRAMUSCULAR | Status: DC
  Administered 2013-11-27 – 2013-11-28 (×4): 5000 [IU] via SUBCUTANEOUS
  Filled 2013-11-27 (×6): qty 1

## 2013-11-27 NOTE — Progress Notes (Signed)
UR completed 

## 2013-11-27 NOTE — ED Notes (Signed)
Pt. assisted to [facility] tolerated well. Assisted back to stretcher.

## 2013-11-27 NOTE — H&P (Signed)
Cardiology History and Physical  PCP: Marijean Bravo, MD Cardiologist: Dr. Minus Breeding  History of Present Illness (and review of medical records): [name] is a 48 y.o. female who presents for evaluation of shortness of breath.  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  She presents with 3-4 days of progressive shortness of breath and dyspnea on exertion with mild activities.  She initally thought it was due her ongoing stress exacerbating anxiety.  However, symptoms have persisted more than usually for her anxiety.  Associated symptoms reported include PND, 3-4 pillow orthopnea, wheezing, swelling of hands and legs.  She also reports mild chest discomfort 4/10 which last a few secs.  She has been non-compliant with medications over the past 2-3 weeks.  In the ED she was found to have BNP 1785, trp 0.01 and CXR with mild pulmonary edema.  Review of Systems Respiratory: positive for wheezing Cardiovascular: negative for near-syncope and syncope Musculoskeletal:positive for back pain Behavioral/Psych: positive for anxiety, depression and increseased social stressors Further review of systems was otherwise negative other than stated in HPI.  Echo 05/2013 ------------------------------------------------------------ Study Conclusions  - Left ventricle: The cavity size was severely dilated. Wall thickness was normal. Systolic function was moderately to severely reduced. The estimated ejection fraction was in the range of 30% to 35%. Diffuse hypokinesis. Akinesis of the midinferior myocardium. Features are consistent with a pseudonormal left ventricular filling pattern, with concomitant abnormal relaxation and increased filling pressure (grade 2 diastolic dysfunction). - Left atrium: The atrium was mildly to moderately dilated.   Patient Active Problem List   Diagnosis Date Noted  . Acute CHF (congestive heart failure) 11/27/2013  . Dyslipidemia 04/04/2011   . Cervical radicular pain 03/25/2011  . Neck pain, acute 03/08/2011  . CARPAL TUNNEL SYNDROME, RIGHT 04/05/2009  . ABNORMAL ELECTROCARDIOGRAM 01/16/2009  . ESSENTIAL HYPERTENSION, BENIGN 06/03/2008  . OBESITY 07/12/2006  . DEPRESSION 07/12/2006  . ADHD 07/12/2006  . MIGRAINE HEADACHE 07/12/2006  . CARDIOMYOPATHY, PERIPARTUM, POSTPARTUM 07/12/2006   Past Medical History  Diagnosis Date  . ADHD   . CARDIOMYOPATHY, PERIPARTUM, POSTPARTUM     a. EF previously 30-40%,  b.  resolved, EF 60%    c.  echo 10/2011:  mild LVH, EF 50%, mild LAE,   . MIGRAINE HEADACHE   . OBESITY   . DEPRESSION   . Essential hypertension, benign   . CARPAL TUNNEL SYNDROME, RIGHT   . Anxiety   . Edema     a. LE venous dopplers negative for DVT 10/2011  . CHF (congestive heart failure)     Past Surgical History  Procedure Laterality Date  . Child birth  2003  . Cholecystectomy      Prescriptions prior to admission  Medication Sig Dispense Refill  . amLODipine (NORVASC) 5 MG tablet Take 10 mg by mouth daily.      [name] amphetamine-dextroamphetamine (ADDERALL XR) 30 MG 24 hr capsule Take 30 mg by mouth daily.      . carvedilol (COREG) 12.5 MG tablet Take 12.5 mg by mouth 2 (two) times daily with a meal. *takes along with 87m doses for 37.527mbid*      . carvedilol (COREG) 25 MG tablet Take 25 mg by mouth 2 (two) times daily with a meal. *takes along with 12.mg doses for 37.100m69mid*      . clonazePAM (KLONOPIN) 1 MG tablet Take 1 mg by mouth 3 (three) times daily as needed for anxiety.       .[name]  enalapril (VASOTEC) 20 MG tablet Take 20 mg by mouth 2 (two) times daily.      . furosemide (LASIX) 40 MG tablet Take 40 mg by mouth daily.      [name] ibuprofen (ADVIL,MOTRIN) 800 MG tablet Take 800 mg by mouth every 8 (eight) hours as needed for headache or mild pain.       [name] lamoTRIgine (LAMICTAL) 200 MG tablet Take 200 mg by mouth daily.      . potassium chloride SA (K-DUR,KLOR-CON) 20 MEQ tablet Take 20 mEq by mouth daily.       . simvastatin (ZOCOR) 20 MG tablet Take 20 mg by mouth daily.       Allergies  Allergen Reactions  . Duloxetine     REACTION: Sucidal thoughts  . Penicillins     REACTION: rash  . Prozac [Fluoxetine Hcl] Other (See Comments)    More depressed    History  Substance Use Topics  . Smoking status: Former Smoker    Quit date: 02/25/1997  . Smokeless tobacco: Not on file     Comment: Married, lives with spouse (when he is not traveling) and son  . Alcohol Use: No    Family History  Problem Relation Age of Onset  . Hypertension Other   . Diabetes Other   . Colitis Other   . Alcohol abuse Other      Objective:  Patient Vitals for the past 8 hrs:  BP Pulse Resp SpO2 Height Weight  11/27/13 0357 - - - - 5' 4"  (1.626 m) 134.3 kg (296 lb 1.2 oz)  11/27/13 0330 124/73 mmHg 92 26 89 % - -  11/27/13 0315 140/82 mmHg 91 24 91 % - -  11/27/13 0300 148/90 mmHg 95 27 93 % - -  11/27/13 0245 137/62 mmHg 65 26 96 % - -  11/27/13 0230 164/107 mmHg 99 24 94 % - -  11/27/13 0215 159/98 mmHg 95 17 97 % - -  11/27/13 0200 153/109 mmHg 101 18 97 % - -  11/27/13 0100 120/74 mmHg 98 18 97 % - -  11/27/13 0045 144/99 mmHg 97 13 98 % - -  11/27/13 0030 151/101 mmHg 99 13 92 % - -  11/27/13 0015 154/100 mmHg 99 20 97 % - -  11/27/13 0000 149/94 mmHg 104 20 98 % - -  11/26/13 2358 154/101 mmHg 104 17 99 % - -   General appearance: obese female, no distress Head: Normocephalic, without obvious abnormality, atraumatic Eyes: conjunctivae/corneas clear. PERRL, EOM's intact. Neck: thick neck difficult to appreciate JVD Lungs: clear to auscultation bilaterally Chest wall: no tenderness Heart: regular rate and rhythm, S1, S2 normal, no murmur,  Abdomen: soft, non-tender; bowel sounds normal Extremities: BLE  edema Pulses: 2+ and symmetric Neurologic: Grossly normal  Results for orders placed during the hospital encounter of 11/26/13 (from the past 48 hour(s))  CBC     Status: Abnormal    Collection Time    11/26/13  9:35 PM      Result Value Ref Range   WBC 10.7 (*) 4.0 - 10.5 K/uL   RBC 4.81  3.87 - 5.11 MIL/uL   Hemoglobin 13.5  12.0 - 15.0 g/dL   HCT 40.0  36.0 - 46.0 %   MCV 83.2  78.0 - 100.0 fL   MCH 28.1  26.0 - 34.0 pg   MCHC 33.8  30.0 - 36.0 g/dL   RDW 15.2  11.5 - 15.5 %   Platelets 274  150 - 400 K/uL  PRO B NATRIURETIC PEPTIDE     Status: Abnormal   Collection Time    11/26/13  9:35 PM      Result Value Ref Range   Pro B Natriuretic peptide (BNP) 1785.0 (*) 0 - 125 pg/mL  BASIC METABOLIC PANEL     Status: Abnormal   Collection Time    11/26/13  9:35 PM      Result Value Ref Range   Sodium 140  137 - 147 mEq/L   Potassium 3.4 (*) 3.7 - 5.3 mEq/L   Chloride 100  96 - 112 mEq/L   CO2 27  19 - 32 mEq/L   Glucose, Bld 104 (*) 70 - 99 mg/dL   BUN 12  6 - 23 mg/dL   Creatinine, Ser 0.56  0.50 - 1.10 mg/dL   Calcium 9.2  8.4 - 10.5 mg/dL   GFR calc non Af Amer >90  >90 mL/min   GFR calc Af Amer >90  >90 mL/min   Comment: (NOTE)     The eGFR has been calculated using the CKD EPI equation.     This calculation has not been validated in all clinical situations.     eGFR's persistently <90 mL/min signify possible Chronic Kidney     Disease.   Anion gap 13  5 - 15  I-STAT TROPOININ, ED     Status: None   Collection Time    11/26/13  9:42 PM      Result Value Ref Range   Troponin i, poc 0.01  0.00 - 0.08 ng/mL   Comment 3            Comment: Due to the release kinetics of cTnI,     a negative result within the first hours     of the onset of symptoms does not rule out     myocardial infarction with certainty.     If myocardial infarction is still suspected,     repeat the test at appropriate intervals.  MAGNESIUM     Status: None   Collection Time    11/27/13  4:34 AM      Result Value Ref Range   Magnesium 1.7  1.5 - 2.5 mg/dL  TROPONIN I     Status: None   Collection Time    11/27/13  4:34 AM      Result Value Ref Range   Troponin I <0.30   <0.30 ng/mL   Comment:            Due to the release kinetics of cTnI,     a negative result within the first hours     of the onset of symptoms does not rule out     myocardial infarction with certainty.     If myocardial infarction is still suspected,     repeat the test at appropriate intervals.   Dg Chest Portable 1 View  11/27/2013   CLINICAL DATA:  Left mid chest pain and upper back pain. Shortness of breath.  EXAM: PORTABLE CHEST - 1 VIEW  COMPARISON:  Chest radiograph 05/17/2013  FINDINGS: Stable cardiomegaly. Low lung volumes. Mild bilateral diffuse interstitial pulmonary opacities. No definite pleural effusion or pneumothorax. Regional skeleton is unremarkable.  IMPRESSION: Cardiomegaly. Mild interstitial pulmonary opacities suggestive of mild interstitial pulmonary edema.   Electronically Signed   By: Lovey Newcomer M.D.   On: 11/27/2013 01:20    ECG:  Sinus tachycardia, HR 115, LAD, probably left atrial abnormality, other  than increased HR, likely no significant change from prior.  Assessment: Acute on chronic CHF, combined systolic and diastolic  Nonischemic CMP EF 30-35% Hypertensive heart disease Hypokalemia Hypomagnesemia Morbid Obesity Non-compliance  Plan: 1. Cardiology Observation  2. Continuous monitoring on Telemetry. 3. Repeat ekg on admit, prn chest pain or arrythmia 4. Trend cardiac biomarkers, check tsh 5. IV diuresis with Lasix 26m BID, strict I/Os, daily weights 6. Continue home ACEi, titrate back up BB 7. Also Norvasc for additional BP control 8. Replace electrolytes. 9. Discussed importance of compliance with medications.

## 2013-11-27 NOTE — Progress Notes (Signed)
Attempted to receive report from ED.

## 2013-11-27 NOTE — Progress Notes (Addendum)
3L net negative overnight. She is breathing better. Troponin negative x 2. BP medications adjusted and subsequently her BP is improved. No clear indication for echo (last in 4/15), will d/c. Continue IV diuresis, may be able to d/c home tomorrow if appropriately diuresed.  Pixie Casino, MD, [facility] Attending Cardiologist [place]

## 2013-11-27 NOTE — ED Notes (Signed)
Attempted report 

## 2013-11-27 NOTE — ED Provider Notes (Signed)
CSN: [id number]     Arrival date & time 11/26/13  2044 History   First MD Initiated Contact with Patient 11/27/13 0009     Chief Complaint  Patient presents with  . Shortness of Breath  . Chest Pain     Patient is a 48 y.o. female presenting with shortness of breath and chest pain.  Shortness of Breath Severity:  Moderate Onset quality:  Gradual Duration:  4 days Timing:  Intermittent Progression:  Worsening Chronicity:  Recurrent Relieved by:  Rest Worsened by:  Exertion Associated symptoms: chest pain   Associated symptoms: no cough, no fever and no vomiting   Chest Pain Associated symptoms: shortness of breath   Associated symptoms: no cough, no fever and not vomiting   pt presents for SOB for past 4 days She reports it has been worsening, worse with exertion and lying flat, improved with sitting up She also reports brief episodes of sharp chest pain No fever/cough She has h/o postpartum cardiomyopathy and is actively managed by cardiology She reports this feels similar to prior CHF episodes   Past Medical History  Diagnosis Date  . ADHD   . CARDIOMYOPATHY, PERIPARTUM, POSTPARTUM     a. EF previously 30-40%,  b.  resolved, EF 60%    c.  echo 10/2011:  mild LVH, EF 50%, mild LAE,   . MIGRAINE HEADACHE   . OBESITY   . DEPRESSION   . Essential hypertension, benign   . CARPAL TUNNEL SYNDROME, RIGHT   . Anxiety   . Edema     a. LE venous dopplers negative for DVT 10/2011   Past Surgical History  Procedure Laterality Date  . Child birth  2003  . Cholecystectomy     Family History  Problem Relation Age of Onset  . Hypertension Other   . Diabetes Other   . Colitis Other   . Alcohol abuse Other    History  Substance Use Topics  . Smoking status: Former Smoker    Quit date: 02/25/1997  . Smokeless tobacco: Not on file     Comment: Married, lives with spouse (when he is not traveling) and son  . Alcohol Use: No   OB History   Grav Para Term Preterm Abortions  TAB SAB Ect Mult Living                 Review of Systems  Constitutional: Negative for fever.  Respiratory: Positive for shortness of breath. Negative for cough.   Cardiovascular: Positive for chest pain.  Gastrointestinal: Negative for vomiting.  Psychiatric/Behavioral: The patient is nervous/anxious.   All other systems reviewed and are negative.     Allergies  Duloxetine and Penicillins  Home Medications   Prior to Admission medications   Medication Sig Start Date End Date Taking? Authorizing Provider  amLODipine (NORVASC) 5 MG tablet Take 10 mg by mouth daily. 05/20/13   Minus Breeding, MD  amphetamine-dextroamphetamine (ADDERALL XR) 30 MG 24 hr capsule Take 1 capsule (30 mg total) by mouth every morning. 11/18/11   Rowe Clack, MD  carvedilol (COREG) 12.5 MG tablet Take 1 tablet (12.5 mg total) by mouth 2 (two) times daily with a meal. 06/22/13   Minus Breeding, MD  carvedilol (COREG) 25 MG tablet Take 1 tablet (25 mg total) by mouth 2 (two) times daily with a meal. 05/20/13   Minus Breeding, MD  clindamycin (CLEOCIN T) 1 % lotion Apply 1 application topically as directed. 05/13/13   Historical Provider, MD  clindamycin (  CLEOCIN) 300 MG capsule Take 300 mg by mouth as needed.  09/01/13   Historical Provider, MD  clonazePAM (KLONOPIN) 1 MG tablet Take 1 mg by mouth as needed. 06/08/13   Historical Provider, MD  enalapril (VASOTEC) 20 MG tablet Take 1 tablet (20 mg total) by mouth 2 (two) times daily. 11/08/13   Minus Breeding, MD  furosemide (LASIX) 40 MG tablet Take 1 tablet (40 mg total) by mouth daily. 11/08/13   Minus Breeding, MD  hydrocortisone valerate ointment (WEST-CORT) 0.2 %  08/19/13   Historical Provider, MD  ibuprofen (ADVIL,MOTRIN) 800 MG tablet Take 800 mg by mouth as needed.  05/14/13   Historical Provider, MD  ipratropium (ATROVENT) 0.03 % nasal spray 1 spray. PRN 05/13/13   Historical Provider, MD  lamoTRIgine (LAMICTAL) 200 MG tablet Take 200 mg by mouth daily.     Historical Provider, MD  potassium chloride SA (K-DUR,KLOR-CON) 20 MEQ tablet Take 1 tablet (20 mEq total) by mouth daily. 11/08/13   Minus Breeding, MD  simvastatin (ZOCOR) 20 MG tablet Take 1 tablet (20 mg total) by mouth daily. 11/08/13 12/13/14  Minus Breeding, MD   BP 154/101  Pulse 104  Temp(Src) 97.8 F (36.6 C) (Oral)  Resp 17  Ht 5\' 4"  (1.626 m)  Wt 295 lb (133.811 kg)  BMI 50.61 kg/m2  SpO2 99% Physical Exam CONSTITUTIONAL: Well developed/well nourished HEAD: Normocephalic/atraumatic EYES: EOMI/PERRL ENMT: Mucous membranes moist NECK: supple no meningeal signs SPINE:entire spine nontender CV: S1/S2 noted, no murmurs/rubs/gallops noted LUNGS: crackles bilaterally no apparent distress ABDOMEN: soft, nontender, no rebound or guarding GU:no cva tenderness NEURO: Pt is awake/alert, moves all extremitiesx4 EXTREMITIES: pulses normal, full ROM. Edema noted to bilateral LE SKIN: warm, color normal PSYCH: no abnormalities of mood noted  ED Course  Procedures 1:09 AM Pt with likely CHF exacerbation Lasix/NTG ordered D/w dr Colon Flattery with cardiology, will evaluate for admission  Labs Review Labs Reviewed  CBC - Abnormal; Notable for the following:    WBC 10.7 (*)    All other components within normal limits  PRO B NATRIURETIC PEPTIDE - Abnormal; Notable for the following:    Pro B Natriuretic peptide (BNP) 1785.0 (*)    All other components within normal limits  BASIC METABOLIC PANEL - Abnormal; Notable for the following:    Potassium 3.4 (*)    Glucose, Bld 104 (*)    All other components within normal limits  I-STAT TROPOININ, ED    Imaging Review No results found.   EKG Interpretation   Date/Time:  Friday November 26 2013 20:49:21 EDT Ventricular Rate:  115 PR Interval:  152 QRS Duration: 92 QT Interval:  352 QTC Calculation: 486 R Axis:   -39 Text Interpretation:  Sinus tachycardia Right atrial enlargement Left axis  deviation Pulmonary disease pattern  Minimal voltage criteria for LVH, may  be normal variant Abnormal ECG No significant change since last tracing  Confirmed by Christy Gentles  MD, Elenore Rota ([id number]) on 11/27/2013 12:06:19 AM      MDM   Final diagnoses:  Acute systolic congestive heart failure    Nursing notes including past medical history and social history reviewed and considered in documentation Labs/vital reviewed and considered xrays reviewed and considered Previous records reviewed and considered     Sharyon Cable, MD 11/27/13 0109

## 2013-11-27 NOTE — ED Provider Notes (Signed)
NTG not ordered as BP drop transiently, cardiology here to see patient  Sharyon Cable, MD 11/27/13 [phone]

## 2013-11-27 NOTE — ED Notes (Signed)
Admit MD at bedside

## 2013-11-28 DIAGNOSIS — I10 Essential (primary) hypertension: Secondary | ICD-10-CM

## 2013-11-28 DIAGNOSIS — I5023 Acute on chronic systolic (congestive) heart failure: Secondary | ICD-10-CM

## 2013-11-28 DIAGNOSIS — I509 Heart failure, unspecified: Secondary | ICD-10-CM

## 2013-11-28 DIAGNOSIS — O903 Peripartum cardiomyopathy: Secondary | ICD-10-CM

## 2013-11-28 DIAGNOSIS — Z9114 Patient's other noncompliance with medication regimen: Secondary | ICD-10-CM

## 2013-11-28 DIAGNOSIS — F908 Attention-deficit hyperactivity disorder, other type: Secondary | ICD-10-CM

## 2013-11-28 LAB — BASIC METABOLIC PANEL
Anion gap: 13 (ref 5–15)
BUN: 24 mg/dL — ABNORMAL HIGH (ref 6–23)
CO2: 26 mEq/L (ref 19–32)
Calcium: 9.5 mg/dL (ref 8.4–10.5)
Chloride: 98 mEq/L (ref 96–112)
Creatinine, Ser: 0.75 mg/dL (ref 0.50–1.10)
GFR calc Af Amer: >90 mL/min (ref >90)
GFR calc non Af Amer: >90 mL/min (ref >90)
Glucose, Bld: 107 mg/dL — ABNORMAL HIGH (ref 70–99)
Potassium: 4 mEq/L (ref 3.7–5.3)
Sodium: 137 mEq/L (ref 137–147)

## 2013-11-28 LAB — PRO B NATRIURETIC PEPTIDE: Pro B Natriuretic peptide (BNP): 796.2 pg/mL — ABNORMAL HIGH (ref 0–125)

## 2013-11-28 MED ORDER — FUROSEMIDE 40 MG PO TABS: 40.0000 mg | ORAL_TABLET | Freq: Every day | ORAL | Status: DC

## 2013-11-28 MED ORDER — CARVEDILOL 12.5 MG PO TABS: 12.5000 mg | ORAL_TABLET | Freq: Two times a day (BID) | ORAL | Status: DC

## 2013-11-28 MED ORDER — POTASSIUM CHLORIDE CRYS ER 20 MEQ PO TBCR: 20.0000 meq | EXTENDED_RELEASE_TABLET | Freq: Every day | ORAL | Status: DC

## 2013-11-28 MED ORDER — AMLODIPINE BESYLATE 10 MG PO TABS: 10.0000 mg | ORAL_TABLET | Freq: Every day | ORAL | Status: DC

## 2013-11-28 MED ORDER — ACETAMINOPHEN 325 MG PO TABS: 650.0000 mg | ORAL_TABLET | ORAL | Status: DC | PRN

## 2013-11-28 MED ORDER — SIMVASTATIN 20 MG PO TABS: 20.0000 mg | ORAL_TABLET | Freq: Every day | ORAL | Status: DC

## 2013-11-28 MED ORDER — ENALAPRIL MALEATE 20 MG PO TABS: 20.0000 mg | ORAL_TABLET | Freq: Two times a day (BID) | ORAL | Status: DC

## 2013-11-28 NOTE — Progress Notes (Signed)
Patient is being discharged. All DC instructions given, patient verbalizes understanding. DC IV, DC Tele.

## 2013-11-28 NOTE — Discharge Summary (Signed)
Patient ID: [name],  MRN: [id number], DOB/AGE: [birth year] 48 y.o.  Admit date: 11/26/2013 Discharge date: 11/28/2013  Primary Care Provider: Marijean Bravo, MD Primary Cardiologist: Dr Percival Spanish  Discharge Diagnoses Principal Problem:   Acute on chronic systolic CHF (congestive heart failure), NYHA class 3 Active Problems:   Uncontrolled hypertension   CARDIOMYOPATHY, PERIPARTUM, POSTPARTUM   Noncompliance with medications   Morbid obesity   Dyslipidemia   Attention deficit hyperactivity disorder (ADHD)    Hospital Course:  48 y.o. Female, followed by Dr Percival Spanish,  Presented 11/26/13 for evaluation of shortness of breath. 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 She presents with 3-4 days of progressive shortness of breath and dyspnea on exertion with mild activities. She initally thought it was due her ongoing stress exacerbating anxiety. She has been non-compliant with medications over the past 2-3 weeks. In the ED she was found to have BNP 1785, trp 0.01 and CXR with mild pulmonary edema. She was admitted, diuresed with IV Lasix, and her cardiac medications resumed. Dr Debara Pickett feels she can be discharged 11/27/13.    Discharge Vitals:  Blood pressure 105/63, pulse 79, temperature 97.5 F (36.4 C), temperature source Oral, resp. rate 20, height 5\' 4"  (1.626 m), weight 293 lb 10.4 oz (133.2 kg), last menstrual period 11/24/2013, SpO2 98.00%.    Labs: Results for orders placed during the hospital encounter of 11/26/13 (from the past 24 hour(s))  BASIC METABOLIC PANEL     Status: Abnormal   Collection Time    11/28/13  3:49 AM      Result Value Ref Range   Sodium 137  137 - 147 mEq/L   Potassium 4.0  3.7 - 5.3 mEq/L   Chloride 98  96 - 112 mEq/L   CO2 26  19 - 32 mEq/L   Glucose, Bld 107 (*) 70 - 99 mg/dL   BUN 24 (*) 6 - 23 mg/dL   Creatinine, Ser 0.75  0.50 - 1.10 mg/dL   Calcium 9.5  8.4 - 10.5 mg/dL   GFR calc non Af Amer >90   >90 mL/min   GFR calc Af Amer >90  >90 mL/min   Anion gap 13  5 - 15  PRO B NATRIURETIC PEPTIDE     Status: Abnormal   Collection Time    11/28/13  3:49 AM      Result Value Ref Range   Pro B Natriuretic peptide (BNP) 796.2 (*) 0 - 125 pg/mL    Disposition:      Follow-up Information   Follow up with Minus Breeding, MD. (office will call you)    Specialty:  Cardiology   Contact information:   [address] [place] [address] [phone]       Discharge Medications:    Medication List         acetaminophen 325 MG tablet  Commonly known as:  TYLENOL  Take 2 tablets (650 mg total) by mouth every 4 (four) hours as needed for headache or mild pain.     amLODipine 10 MG tablet  Commonly known as:  NORVASC  Take 1 tablet (10 mg total) by mouth daily.     amphetamine-dextroamphetamine 30 MG 24 hr capsule  Commonly known as:  ADDERALL XR  Take 30 mg by mouth daily.     carvedilol 12.5 MG tablet  Commonly known as:  COREG  Take 1 tablet (12.5 mg total) by mouth 2 (two) times daily  with a meal.     clonazePAM 1 MG tablet  Commonly known as:  KLONOPIN  Take 1 mg by mouth 3 (three) times daily as needed for anxiety.     enalapril 20 MG tablet  Commonly known as:  VASOTEC  Take 1 tablet (20 mg total) by mouth 2 (two) times daily.     furosemide 40 MG tablet  Commonly known as:  LASIX  Take 1 tablet (40 mg total) by mouth daily.     ibuprofen 800 MG tablet  Commonly known as:  ADVIL,MOTRIN  Take 800 mg by mouth every 8 (eight) hours as needed for headache or mild pain.     lamoTRIgine 200 MG tablet  Commonly known as:  LAMICTAL  Take 200 mg by mouth daily.     potassium chloride SA 20 MEQ tablet  Commonly known as:  K-DUR,KLOR-CON  Take 1 tablet (20 mEq total) by mouth daily.     simvastatin 20 MG tablet  Commonly known as:  ZOCOR  Take 1 tablet (20 mg total) by mouth daily.         Duration of Discharge Encounter: Greater than 30 minutes  including physician time.  Angelena Form PA-C 11/28/2013 10:17 AM

## 2013-11-28 NOTE — Plan of Care (Signed)
Problem: Phase I Progression Outcomes Goal: EF % per last Echo/documented,Core Reminder form on chart Outcome: Completed/Met Date Met:  11/28/13 EF 30-35%(April 2015)

## 2013-11-28 NOTE — Progress Notes (Signed)
DAILY PROGRESS NOTE  Subjective:  Further net negative. Weight is down from 296 to 293 lbs.  Blood pressure appears much better controlled today. BNP improved from 1785 to 796.  Objective:  Temp:  [98.1 F (36.7 C)-98.4 F (36.9 C)] 98.1 F (36.7 C) (10/04 0456) Pulse Rate:  [85-100] 88 (10/04 0456) Resp:  [18-20] 18 (10/04 0456) BP: (106-131)/(67-86) 121/86 mmHg (10/04 0456) SpO2:  [96 %-98 %] 98 % (10/04 0456) Weight:  [293 lb 10.4 oz (133.2 kg)] 293 lb 10.4 oz (133.2 kg) (10/04 0456) Weight change: -1 lb 5.6 oz (-0.611 kg)  Intake/Output from previous day: 10/03 0701 - 10/04 0700 In: 1080 [P.O.:1080] Out: 1900 [Urine:1900]  Intake/Output from this shift:    Medications: Current Facility-Administered Medications  Medication Dose Route Frequency Provider Last Rate Last Dose  . 0.9 %  sodium chloride infusion  250 mL Intravenous PRN Alwyn Pea, MD      . acetaminophen (TYLENOL) tablet 650 mg  650 mg Oral Q4H PRN Alwyn Pea, MD      . amLODipine (NORVASC) tablet 10 mg  10 mg Oral Daily Alwyn Pea, MD   10 mg at 11/27/13 1048  . amphetamine-dextroamphetamine (ADDERALL XR) 24 hr capsule 30 mg  30 mg Oral Daily Satira Sark, MD   30 mg at 11/27/13 1516  . carvedilol (COREG) tablet 12.5 mg  12.5 mg Oral BID WC Alwyn Pea, MD   12.5 mg at 11/28/13 5830  . clonazePAM (KLONOPIN) tablet 1 mg  1 mg Oral TID PRN Alwyn Pea, MD   1 mg at 11/27/13 2222  . enalapril (VASOTEC) tablet 20 mg  20 mg Oral BID Alwyn Pea, MD   20 mg at 11/27/13 2221  . furosemide (LASIX) tablet 40 mg  40 mg Oral Daily Pixie Casino, MD      . heparin injection 5,000 Units  5,000 Units Subcutaneous 3 times per day Alwyn Pea, MD   5,000 Units at 11/28/13 [phone]  . lamoTRIgine (LAMICTAL) tablet 200 mg  200 mg Oral Daily Pixie Casino, MD   200 mg at 11/27/13 1517  . ondansetron (ZOFRAN) injection 4 mg  4 mg Intravenous Q6H PRN Alwyn Pea, MD        . simvastatin (ZOCOR) tablet 20 mg  20 mg Oral Daily Alwyn Pea, MD   20 mg at 11/27/13 1047  . sodium chloride 0.9 % injection 3 mL  3 mL Intravenous Q12H Alwyn Pea, MD   3 mL at 11/27/13 2238  . sodium chloride 0.9 % injection 3 mL  3 mL Intravenous PRN Alwyn Pea, MD        Physical Exam: General appearance: alert and no distress Neck: no carotid bruit and no JVD Lungs: clear to auscultation bilaterally Heart: regular rate and rhythm, S1, S2 normal, no murmur, click, rub or gallop Abdomen: soft, non-tender; bowel sounds normal; no masses,  no organomegaly Extremities: extremities normal, atraumatic, no cyanosis or edema Pulses: 2+ and symmetric Skin: Skin color, texture, turgor normal. No rashes or lesions Neurologic: Grossly normal Psych: Normal  Lab Results: Results for orders placed during the hospital encounter of 11/26/13 (from the past 48 hour(s))  CBC     Status: Abnormal   Collection Time    11/26/13  9:35 PM      Result Value Ref Range   WBC 10.7 (*) 4.0 - 10.5 K/uL   RBC 4.81  3.87 - 5.11 MIL/uL   Hemoglobin 13.5  12.0 - 15.0  g/dL   HCT 40.0  36.0 - 46.0 %   MCV 83.2  78.0 - 100.0 fL   MCH 28.1  26.0 - 34.0 pg   MCHC 33.8  30.0 - 36.0 g/dL   RDW 15.2  11.5 - 15.5 %   Platelets 274  150 - 400 K/uL  PRO B NATRIURETIC PEPTIDE     Status: Abnormal   Collection Time    11/26/13  9:35 PM      Result Value Ref Range   Pro B Natriuretic peptide (BNP) 1785.0 (*) 0 - 125 pg/mL  BASIC METABOLIC PANEL     Status: Abnormal   Collection Time    11/26/13  9:35 PM      Result Value Ref Range   Sodium 140  137 - 147 mEq/L   Potassium 3.4 (*) 3.7 - 5.3 mEq/L   Chloride 100  96 - 112 mEq/L   CO2 27  19 - 32 mEq/L   Glucose, Bld 104 (*) 70 - 99 mg/dL   BUN 12  6 - 23 mg/dL   Creatinine, Ser 0.56  0.50 - 1.10 mg/dL   Calcium 9.2  8.4 - 10.5 mg/dL   GFR calc non Af Amer >90  >90 mL/min   GFR calc Af Amer >90  >90 mL/min   Comment: (NOTE)     The eGFR  has been calculated using the CKD EPI equation.     This calculation has not been validated in all clinical situations.     eGFR's persistently <90 mL/min signify possible Chronic Kidney     Disease.   Anion gap 13  5 - 15  I-STAT TROPOININ, ED     Status: None   Collection Time    11/26/13  9:42 PM      Result Value Ref Range   Troponin i, poc 0.01  0.00 - 0.08 ng/mL   Comment 3            Comment: Due to the release kinetics of cTnI,     a negative result within the first hours     of the onset of symptoms does not rule out     myocardial infarction with certainty.     If myocardial infarction is still suspected,     repeat the test at appropriate intervals.  MAGNESIUM     Status: None   Collection Time    11/27/13  4:34 AM      Result Value Ref Range   Magnesium 1.7  1.5 - 2.5 mg/dL  TROPONIN I     Status: None   Collection Time    11/27/13  4:34 AM      Result Value Ref Range   Troponin I <0.30  <0.30 ng/mL   Comment:            Due to the release kinetics of cTnI,     a negative result within the first hours     of the onset of symptoms does not rule out     myocardial infarction with certainty.     If myocardial infarction is still suspected,     repeat the test at appropriate intervals.  TROPONIN I     Status: None   Collection Time    11/27/13 10:00 AM      Result Value Ref Range   Troponin I <0.30  <0.30 ng/mL   Comment:            Due to the release  kinetics of cTnI,     a negative result within the first hours     of the onset of symptoms does not rule out     myocardial infarction with certainty.     If myocardial infarction is still suspected,     repeat the test at appropriate intervals.  BASIC METABOLIC PANEL     Status: Abnormal   Collection Time    11/28/13  3:49 AM      Result Value Ref Range   Sodium 137  137 - 147 mEq/L   Potassium 4.0  3.7 - 5.3 mEq/L   Chloride 98  96 - 112 mEq/L   CO2 26  19 - 32 mEq/L   Glucose, Bld 107 (*) 70 - 99 mg/dL     BUN 24 (*) 6 - 23 mg/dL   Comment: DELTA CHECK NOTED   Creatinine, Ser 0.75  0.50 - 1.10 mg/dL   Calcium 9.5  8.4 - 10.5 mg/dL   GFR calc non Af Amer >90  >90 mL/min   GFR calc Af Amer >90  >90 mL/min   Comment: (NOTE)     The eGFR has been calculated using the CKD EPI equation.     This calculation has not been validated in all clinical situations.     eGFR's persistently <90 mL/min signify possible Chronic Kidney     Disease.   Anion gap 13  5 - 15  PRO B NATRIURETIC PEPTIDE     Status: Abnormal   Collection Time    11/28/13  3:49 AM      Result Value Ref Range   Pro B Natriuretic peptide (BNP) 796.2 (*) 0 - 125 pg/mL    Imaging: Dg Chest Portable 1 View  11/27/2013   CLINICAL DATA:  Left mid chest pain and upper back pain. Shortness of breath.  EXAM: PORTABLE CHEST - 1 VIEW  COMPARISON:  Chest radiograph 05/17/2013  FINDINGS: Stable cardiomegaly. Low lung volumes. Mild bilateral diffuse interstitial pulmonary opacities. No definite pleural effusion or pneumothorax. Regional skeleton is unremarkable.  IMPRESSION: Cardiomegaly. Mild interstitial pulmonary opacities suggestive of mild interstitial pulmonary edema.   Electronically Signed   By: Lovey Newcomer M.D.   On: 11/27/2013 01:20    Assessment:  Principal Problem:   Acute on chronic systolic CHF (congestive heart failure), NYHA class 3 Active Problems:   Morbid obesity   Malignant essential hypertension with CHF, NYHA class 3   CARDIOMYOPATHY, PERIPARTUM, POSTPARTUM   Noncompliance with medications   Plan:  1. Improved today after re-establishing medications. Closing on diuretic endpoint. Wants to go home to care for her son with Asperger's.  I believe she is adequately diuresed. Will transition back to home lasix 40 mg daily today. Ok for d/c from my standpoint. Follow-up with Dr. Percival Spanish or MLP in 5-7 days.   Time Spent Directly with Patient:  15 minutes  Length of Stay:  LOS: 2 days   Pixie Casino, MD,  [facility] Attending Cardiologist [facility]  HILTY,Kenneth C 11/28/2013, 7:54 AM

## 2013-11-28 NOTE — Discharge Instructions (Signed)

## 2013-11-29 ENCOUNTER — Telehealth: Payer: Self-pay | Admitting: Cardiology

## 2013-11-29 NOTE — Telephone Encounter (Signed)
Closed encounter °

## 2013-12-05 ENCOUNTER — Inpatient Hospital Stay (HOSPITAL_COMMUNITY)
Admission: AD | Admit: 2013-12-05 | Discharge: 2013-12-08 | DRG: 885 | Disposition: A | Discharge status: Home / Self Care | Payer: Medicare Other | Source: Intra-hospital | Attending: Psychiatry | Admitting: Psychiatry

## 2013-12-05 ENCOUNTER — Encounter (HOSPITAL_COMMUNITY): Payer: Self-pay | Admitting: *Deleted

## 2013-12-05 DIAGNOSIS — F909 Attention-deficit hyperactivity disorder, unspecified type: Secondary | ICD-10-CM | POA: Diagnosis present

## 2013-12-05 DIAGNOSIS — F332 Major depressive disorder, recurrent severe without psychotic features: Secondary | ICD-10-CM | POA: Diagnosis present

## 2013-12-05 DIAGNOSIS — I5022 Chronic systolic (congestive) heart failure: Secondary | ICD-10-CM | POA: Diagnosis present

## 2013-12-05 DIAGNOSIS — F418 Other specified anxiety disorders: Secondary | ICD-10-CM | POA: Diagnosis present

## 2013-12-05 DIAGNOSIS — E1159 Type 2 diabetes mellitus with other circulatory complications: Secondary | ICD-10-CM | POA: Diagnosis present

## 2013-12-05 DIAGNOSIS — O903 Peripartum cardiomyopathy: Secondary | ICD-10-CM

## 2013-12-05 DIAGNOSIS — Z8249 Family history of ischemic heart disease and other diseases of the circulatory system: Secondary | ICD-10-CM

## 2013-12-05 DIAGNOSIS — Z6841 Body Mass Index (BMI) 40.0 and over, adult: Secondary | ICD-10-CM

## 2013-12-05 DIAGNOSIS — E785 Hyperlipidemia, unspecified: Secondary | ICD-10-CM

## 2013-12-05 DIAGNOSIS — Z87891 Personal history of nicotine dependence: Secondary | ICD-10-CM

## 2013-12-05 DIAGNOSIS — I5042 Chronic combined systolic (congestive) and diastolic (congestive) heart failure: Secondary | ICD-10-CM | POA: Diagnosis present

## 2013-12-05 DIAGNOSIS — M5412 Radiculopathy, cervical region: Secondary | ICD-10-CM

## 2013-12-05 DIAGNOSIS — I10 Essential (primary) hypertension: Secondary | ICD-10-CM | POA: Diagnosis present

## 2013-12-05 DIAGNOSIS — Z599 Problem related to housing and economic circumstances, unspecified: Secondary | ICD-10-CM

## 2013-12-05 DIAGNOSIS — Z833 Family history of diabetes mellitus: Secondary | ICD-10-CM

## 2013-12-05 DIAGNOSIS — E1169 Type 2 diabetes mellitus with other specified complication: Secondary | ICD-10-CM | POA: Diagnosis present

## 2013-12-05 DIAGNOSIS — G4700 Insomnia, unspecified: Secondary | ICD-10-CM | POA: Diagnosis present

## 2013-12-05 DIAGNOSIS — M542 Cervicalgia: Secondary | ICD-10-CM | POA: Diagnosis present

## 2013-12-05 DIAGNOSIS — I428 Other cardiomyopathies: Secondary | ICD-10-CM | POA: Diagnosis present

## 2013-12-05 DIAGNOSIS — M5410 Radiculopathy, site unspecified: Secondary | ICD-10-CM | POA: Diagnosis present

## 2013-12-05 HISTORY — DX: Chronic systolic (congestive) heart failure: I50.22

## 2013-12-05 LAB — COMPREHENSIVE METABOLIC PANEL
ALT: 21 U/L (ref 0–35)
AST: 20 U/L (ref 0–37)
Albumin: 3.5 g/dL (ref 3.5–5.2)
Alkaline Phosphatase: 37 U/L — ABNORMAL LOW (ref 39–117)
Anion gap: 12 (ref 5–15)
BUN: 18 mg/dL (ref 6–23)
CO2: 27 mEq/L (ref 19–32)
Calcium: 9.1 mg/dL (ref 8.4–10.5)
Chloride: 103 mEq/L (ref 96–112)
Creatinine, Ser: 0.74 mg/dL (ref 0.50–1.10)
GFR calc Af Amer: >90 mL/min (ref >90)
GFR calc non Af Amer: >90 mL/min (ref >90)
Glucose, Bld: 120 mg/dL — ABNORMAL HIGH (ref 70–99)
Potassium: 4 mEq/L (ref 3.7–5.3)
Sodium: 142 mEq/L (ref 137–147)
Total Bilirubin: 0.5 mg/dL (ref 0.3–1.2)
Total Protein: 7.3 g/dL (ref 6.0–8.3)

## 2013-12-05 LAB — TSH: TSH: 1.53 u[IU]/mL (ref 0.350–4.500)

## 2013-12-05 LAB — CBC
HCT: 43.6 % (ref 36.0–46.0)
Hemoglobin: 14.3 g/dL (ref 12.0–15.0)
MCH: 27.8 pg (ref 26.0–34.0)
MCHC: 32.8 g/dL (ref 30.0–36.0)
MCV: 84.7 fL (ref 78.0–100.0)
Platelets: 318 10*3/uL (ref 150–400)
RBC: 5.15 MIL/uL — ABNORMAL HIGH (ref 3.87–5.11)
RDW: 14.9 % (ref 11.5–15.5)
WBC: 10.2 10*3/uL (ref 4.0–10.5)

## 2013-12-05 MED ORDER — IBUPROFEN 600 MG PO TABS
600.0000 mg | ORAL_TABLET | Freq: Three times a day (TID) | ORAL | Status: DC | PRN
  Administered 2013-12-05: 600 mg via ORAL
  Filled 2013-12-05: qty 1

## 2013-12-05 MED ORDER — LORAZEPAM 2 MG/ML IJ SOLN
2.0000 mg | Freq: Once | INTRAMUSCULAR | Status: AC
  Administered 2013-12-05: 2 mg via INTRAMUSCULAR

## 2013-12-05 MED ORDER — CLONAZEPAM 1 MG PO TABS
1.0000 mg | ORAL_TABLET | Freq: Three times a day (TID) | ORAL | Status: DC | PRN
  Administered 2013-12-05 – 2013-12-08 (×6): 1 mg via ORAL
  Filled 2013-12-05 (×7): qty 1

## 2013-12-05 MED ORDER — AMLODIPINE BESYLATE 10 MG PO TABS
10.0000 mg | ORAL_TABLET | Freq: Every day | ORAL | Status: DC
  Administered 2013-12-05 – 2013-12-08 (×4): 10 mg via ORAL
  Filled 2013-12-05 (×5): qty 1
  Filled 2013-12-05: qty 2
  Filled 2013-12-05: qty 1

## 2013-12-05 MED ORDER — CARVEDILOL 12.5 MG PO TABS
12.5000 mg | ORAL_TABLET | Freq: Two times a day (BID) | ORAL | Status: DC
  Administered 2013-12-05 – 2013-12-07 (×5): 12.5 mg via ORAL
  Filled 2013-12-05 (×11): qty 1

## 2013-12-05 MED ORDER — FUROSEMIDE 40 MG PO TABS
40.0000 mg | ORAL_TABLET | Freq: Every day | ORAL | Status: DC
  Administered 2013-12-05 – 2013-12-08 (×4): 40 mg via ORAL
  Filled 2013-12-05 (×6): qty 1
  Filled 2013-12-05: qty 2

## 2013-12-05 MED ORDER — ENALAPRIL MALEATE 20 MG PO TABS
20.0000 mg | ORAL_TABLET | Freq: Two times a day (BID) | ORAL | Status: DC
  Administered 2013-12-05 – 2013-12-08 (×6): 20 mg via ORAL
  Filled 2013-12-05 (×9): qty 1
  Filled 2013-12-05: qty 4
  Filled 2013-12-05: qty 1

## 2013-12-05 MED ORDER — LAMOTRIGINE 100 MG PO TABS
200.0000 mg | ORAL_TABLET | Freq: Every day | ORAL | Status: DC
  Administered 2013-12-05 – 2013-12-08 (×4): 200 mg via ORAL
  Filled 2013-12-05 (×3): qty 1
  Filled 2013-12-05: qty 6
  Filled 2013-12-05: qty 1
  Filled 2013-12-05: qty 2
  Filled 2013-12-05: qty 1

## 2013-12-05 MED ORDER — ALUM & MAG HYDROXIDE-SIMETH 200-200-20 MG/5ML PO SUSP
30.0000 mL | ORAL | Status: DC | PRN
  Administered 2013-12-06 – 2013-12-07 (×2): 30 mL via ORAL

## 2013-12-05 MED ORDER — SIMVASTATIN 20 MG PO TABS
20.0000 mg | ORAL_TABLET | Freq: Every day | ORAL | Status: DC
  Administered 2013-12-06 – 2013-12-07 (×2): 20 mg via ORAL
  Filled 2013-12-05 (×5): qty 1

## 2013-12-05 MED ORDER — ACETAMINOPHEN 325 MG PO TABS
650.0000 mg | ORAL_TABLET | Freq: Four times a day (QID) | ORAL | Status: DC | PRN
  Administered 2013-12-07: 650 mg via ORAL
  Filled 2013-12-05: qty 2

## 2013-12-05 MED ORDER — POTASSIUM CHLORIDE CRYS ER 20 MEQ PO TBCR
20.0000 meq | EXTENDED_RELEASE_TABLET | Freq: Every day | ORAL | Status: DC
  Administered 2013-12-06 – 2013-12-08 (×3): 20 meq via ORAL
  Filled 2013-12-05 (×5): qty 1

## 2013-12-05 MED ORDER — MAGNESIUM HYDROXIDE 400 MG/5ML PO SUSP: 30.0000 mL | Freq: Every day | ORAL | Status: DC | PRN

## 2013-12-05 NOTE — Progress Notes (Signed)
Patient ID: [name], female   DOB: [birth year], 48 y.o.   MRN: [id number] Admit Note. D. Patient direct admit to adult unit [facility]. Pt reports feeling depressed, with suicidal ideations. Pt upon admission very tearful, anxious. Pt reports several stressors including son with mental illness, chronic pain, multiple co-morbidities, financial stress. Pt reports suicidal ideation but is able to contract for safety. Pt HIGH FALL RISK. Pt upon admission very anxious, notified May Augistin NP and orders received for Ativan 2mg  IM stat. Pt oriented to the unit. Skin searched and noted rash to pannus/ lower abdomen. Pt accepted injection without issue. Will continue to monitor q 15 minute for safety.

## 2013-12-05 NOTE — Progress Notes (Signed)
Psychoeducational Group Note  Date:  12/05/2013 Time:  2343  Group Topic/Focus:  Wrap-Up Group:   The focus of this group is to help patients review their daily goal of treatment and discuss progress on daily workbooks.  Participation Level: Did Not Attend  Participation Quality:  Not Applicable  Affect:  Not Applicable  Cognitive:  Not Applicable  Insight:  Not Applicable  Engagement in Group: Not Applicable  Additional Comments: The patient did not attend group since her visitor was still on the unit.   Archie Balboa S 12/05/2013, 11:42 PM

## 2013-12-05 NOTE — Progress Notes (Signed)
Patient received from [facility] directly. She called 911 with SI. She has history of depression. She was received at [facility] and patient was highly anxious and she was covering her face crying. She has extensive cardiac history of Obesity/Hypertension/cardiomyopathy/CHF and was just recently admitted to [facility] on 10/3 for progressive dyspnea and anxiety. She was discharged 11/28/13 with cardiology follow up. [facility] Jearld Adjutant M.D.) called and discussed patient status. We will consult with Hospitalist Service as needed. Ativan 2 mg one time with good effect. She was able to complete [facility] Triage and Nursing admission calm and cooperating. CBC/CMP/TSH ordered. Resumed medications from home.

## 2013-12-05 NOTE — BH Assessment (Deleted)
Patient received from [facility] directly.  She called 911 with SI.  She has history of depression.  She was received at [facility] and patient was highly anxious and she was covering her face crying.  She has extensive cardiac history of Obesity/Hypertension/cardiomyopathy/CHF and was just recently admitted to [facility] on 10/3 for progressive dyspnea and anxiety.  She was discharged 11/28/13 with cardiology follow up.  [facility] Jearld Adjutant M.D.) called and discussed patient status.  We will consult with Hospitalist Service as needed.  Ativan 2 mg one time with good effect.  She was able to complete [facility] Triage and Nursing admission calm and cooperating.  CBC/CMP/TSH ordered.  Resumed medications from home.

## 2013-12-06 ENCOUNTER — Encounter (HOSPITAL_COMMUNITY): Payer: Self-pay | Admitting: Internal Medicine

## 2013-12-06 DIAGNOSIS — F909 Attention-deficit hyperactivity disorder, unspecified type: Secondary | ICD-10-CM

## 2013-12-06 DIAGNOSIS — O903 Peripartum cardiomyopathy: Secondary | ICD-10-CM

## 2013-12-06 DIAGNOSIS — M5412 Radiculopathy, cervical region: Secondary | ICD-10-CM

## 2013-12-06 DIAGNOSIS — I5022 Chronic systolic (congestive) heart failure: Secondary | ICD-10-CM | POA: Diagnosis present

## 2013-12-06 DIAGNOSIS — F332 Major depressive disorder, recurrent severe without psychotic features: Principal | ICD-10-CM

## 2013-12-06 DIAGNOSIS — I5042 Chronic combined systolic (congestive) and diastolic (congestive) heart failure: Secondary | ICD-10-CM | POA: Diagnosis present

## 2013-12-06 DIAGNOSIS — I10 Essential (primary) hypertension: Secondary | ICD-10-CM

## 2013-12-06 MED ORDER — AMPHETAMINE-DEXTROAMPHET ER 10 MG PO CP24
30.0000 mg | ORAL_CAPSULE | Freq: Every day | ORAL | Status: DC
  Administered 2013-12-06 – 2013-12-08 (×3): 30 mg via ORAL
  Filled 2013-12-06 (×3): qty 3

## 2013-12-06 MED ORDER — IBUPROFEN 800 MG PO TABS
800.0000 mg | ORAL_TABLET | Freq: Three times a day (TID) | ORAL | Status: DC | PRN
  Administered 2013-12-06 – 2013-12-08 (×3): 800 mg via ORAL
  Filled 2013-12-06 (×3): qty 1

## 2013-12-06 MED ORDER — PANTOPRAZOLE SODIUM 40 MG PO TBEC
40.0000 mg | DELAYED_RELEASE_TABLET | Freq: Every day | ORAL | Status: DC
  Administered 2013-12-08: 40 mg via ORAL
  Filled 2013-12-06 (×5): qty 1

## 2013-12-06 NOTE — Progress Notes (Signed)
[place] Group Notes:  (Nursing/MHT/Case Management/Adjunct)  Date:  12/06/2013  Time:  1:40 PM  Type of Therapy:  Therapeutic Activity  Participation Level:  Minimal  Participation Quality:  Drowsy and Engaged  Activity: Patients participated in jeopardy like game, answering questions relating to wellness in categories such as Fruits, Fitness, Veggies, Exercise, and Food Safety     Clint Bolder 12/06/2013, 1:40 PM

## 2013-12-06 NOTE — Progress Notes (Signed)
Patient ID: [name], female   DOB: Dec 14, 1965, 48 y.o.   MRN: [id number] She has been up  And interacting with peers and staff.She has requested and received prn medications for anxiety and pain today that was effective. Internal medication came and saw her today see new order.  She has been on the phone often today. Self inventory this AM. Depression 1, hopelessness 1, anxiety 7. Denies withdrawal symptoms and SI thoughts. She has not c/o any chest discomfort today

## 2013-12-06 NOTE — [facility] Group Notes (Signed)
[facility] LCSW Group Therapy 12/06/2013  1:15 PM   Type of Therapy: Group Therapy  Participation Level: Did Not Attend.   Tilden Fossa, MSW, [place] Worker [facility] [phone]

## 2013-12-06 NOTE — H&P (Signed)
Psychiatric Admission Assessment Adult  Patient Identification:  [name] Date of Evaluation:  12/06/2013 Chief Complaint:  DEPRESSION History of Present Illness:  Major depression Elements:  Location:  Major depression. Quality:  Feelings of hopelessness, worthlessness, insomnia, desperation. Severity:  Severe, developed SI. Timing:  Overwhelmed at the care and scope of son's Asperger's diagnosis along with his other medical conditions. Duration:  Long history, PMH-ADD. Context:  Overwhelmed since moving to [place] from [place].  [name] Associated Signs/Synptoms: Depression Symptoms:  depressed mood, anhedonia, insomnia, fatigue, feelings of worthlessness/guilt, hopelessness, (Hypo) Manic Symptoms:  Labiality of Mood, Anxiety Symptoms:  Excessive Worry, Psychotic Symptoms:  NA PTSD Symptoms: Negative Total Time spent with patient: 45 minutes  Psychiatric Specialty Exam: Physical Exam  Constitutional: She appears well-developed.  HENT:  Head: Normocephalic.  Eyes: Right eye exhibits no discharge. Left eye exhibits no discharge. No scleral icterus.  Neck: Normal range of motion.  Cardiovascular: Normal rate.   Respiratory: Effort normal.  GI: Soft.  Musculoskeletal: Normal range of motion.  Neurological: She is alert.  Skin: Skin is warm.  Psychiatric: Her speech is normal. Judgment normal. Her mood appears anxious. She is withdrawn. Cognition and memory are normal. She exhibits a depressed mood.    Review of Systems  Constitutional: Negative.   HENT: Negative.   Eyes: Negative.   Respiratory: Negative.   Cardiovascular: Negative.   Genitourinary: Negative.   Musculoskeletal: Negative.   Skin: Negative.   Neurological: Negative.   Endo/Heme/Allergies: Negative.   Psychiatric/Behavioral: Positive for depression. Negative for suicidal ideas, hallucinations, memory loss and substance abuse. The patient is nervous/anxious and has insomnia.     Blood pressure 105/66, pulse 76,  temperature 97.8 F (36.6 C), temperature source Oral, resp. rate 16, height _0  (1.626 m), weight 131.543 kg (290 lb), last menstrual period 11/24/2013, SpO2 98.00%.Body mass index is 49.75 kg/(m^2).   General Appearance: Disheveled  Eye Contact:: Good  Speech: Normal Rate  Volume: Normal  Mood: depressed and anxious  Affect: Labile and anxious  Thought Process: Goal Directed and Linear  Orientation: Full (Time, Place, and Person)  Thought Content: no hallucinations, no delusions, ruminative about current stressors  Suicidal Thoughts: No- at this time denies any suicidal plan or intention and contracts for safety on the unit  Homicidal Thoughts: No  Memory: recent and remote grossly intact  Judgement: Fair  Insight: Fair  Psychomotor Activity: Decreased  Concentration: Good  Recall: Good  Fund of Knowledge:Good  Language: Good  Akathisia: No  Handed: Right  AIMS (if indicated): Assets: Communication Skills  Resilience  Sleep: Number of Hours: 4.75   Musculoskeletal: Strength & Muscle Tone: within normal limits Gait & Station: normal Patient leans: N/A  Past Psychiatric History: Diagnosis:  Depression  Hospitalizations:    Outpatient Care:    Substance Abuse Care:  Denies  Self-Mutilation:  Denies  Suicidal Attempts:  Denies  Violent Behaviors:  Denies   Past Medical History:   Past Medical History  Diagnosis Date  . ADHD   . CARDIOMYOPATHY, PERIPARTUM, POSTPARTUM     a. EF previously 30-40%,  b.  resolved, EF 60%    c.  echo 10/2011:  mild LVH, EF 50%, mild LAE,   . MIGRAINE HEADACHE   . OBESITY   . DEPRESSION   . Essential hypertension, benign   . CARPAL TUNNEL SYNDROME, RIGHT   . Anxiety   . Edema     a. LE venous dopplers negative for DVT 10/2011  . CHF (congestive heart failure)  Cardiac History:  Cardiomyopathy, EF 30-35%, CHF Allergies:   Allergies  Allergen Reactions  . Duloxetine     REACTION: Sucidal thoughts  . Penicillins     REACTION:  rash  . Prozac [Fluoxetine Hcl] Other (See Comments)    More depressed   PTA Medications: Prescriptions prior to admission  Medication Sig Dispense Refill  . amLODipine (NORVASC) 10 MG tablet Take 1 tablet (10 mg total) by mouth daily.  30 tablet  11  . amphetamine-dextroamphetamine (ADDERALL XR) 30 MG 24 hr capsule Take 30 mg by mouth daily.      . carvedilol (COREG) 12.5 MG tablet Take 1 tablet (12.5 mg total) by mouth 2 (two) times daily with a meal.  60 tablet  11  . clonazePAM (KLONOPIN) 1 MG tablet Take 1 mg by mouth 3 (three) times daily as needed for anxiety.       . enalapril (VASOTEC) 20 MG tablet Take 1 tablet (20 mg total) by mouth 2 (two) times daily.  30 tablet  11  . furosemide (LASIX) 40 MG tablet Take 1 tablet (40 mg total) by mouth daily.  30 tablet  11  . ibuprofen (ADVIL,MOTRIN) 800 MG tablet Take 800 mg by mouth every 8 (eight) hours as needed for headache or mild pain.       [name] lamoTRIgine (LAMICTAL) 200 MG tablet Take 200 mg by mouth daily.      . potassium chloride SA (K-DUR,KLOR-CON) 20 MEQ tablet Take 1 tablet (20 mEq total) by mouth daily.  30 tablet  11  . simvastatin (ZOCOR) 20 MG tablet Take 1 tablet (20 mg total) by mouth daily.  30 tablet  11  . acetaminophen (TYLENOL) 325 MG tablet Take 2 tablets (650 mg total) by mouth every 4 (four) hours as needed for headache or mild pain.        Previous Psychotropic Medications:  Medication/Dose  Had allergic reactions when taking Duloxetine and Fluoxetine               Substance Abuse History in the last 12 months:  No.  Consequences of Substance Abuse: Negative  Social History:  reports that she quit smoking about 16 years ago. She does not have any smokeless tobacco history on file. She reports that she does not drink alcohol or use illicit drugs. Additional Social History: Pain Medications: ibuprofen  Prescriptions: see pta medication  History of alcohol / drug use?: No history of alcohol / drug  abuse Longest period of sobriety (when/how long): n/a Withdrawal Symptoms:  (n/a)  Current Place of Residence:  [employer] of Birth:  [place] Family Members:  Son Marital Status:  Married Children:  Sons:  1  Daughters: Relationships: Education:  Dentist Problems/Performance: Religious Beliefs/Practices: History of Abuse (Emotional/Phsycial/Sexual) Occupational Experiences; Nature conservation officer History:  None. Legal History: Hobbies/Interests:  Family History:   Family History  Problem Relation Age of Onset  . Hypertension Other   . Diabetes Other   . Colitis Other   . Alcohol abuse Other     Results for orders placed during the hospital encounter of 12/05/13 (from the past 72 hour(s))  CBC     Status: Abnormal   Collection Time    12/05/13  7:15 PM      Result Value Ref Range   WBC 10.2  4.0 - 10.5 K/uL   RBC 5.15 (*) 3.87 - 5.11 MIL/uL   Hemoglobin 14.3  12.0 - 15.0 g/dL   HCT 43.6  36.0 - 46.0 %  MCV 84.7  78.0 - 100.0 fL   MCH 27.8  26.0 - 34.0 pg   MCHC 32.8  30.0 - 36.0 g/dL   RDW 14.9  11.5 - 15.5 %   Platelets 318  150 - 400 K/uL   Comment: Performed at [place]     Status: Abnormal   Collection Time    12/05/13  7:15 PM      Result Value Ref Range   Sodium 142  137 - 147 mEq/L   Potassium 4.0  3.7 - 5.3 mEq/L   Chloride 103  96 - 112 mEq/L   CO2 27  19 - 32 mEq/L   Glucose, Bld 120 (*) 70 - 99 mg/dL   BUN 18  6 - 23 mg/dL   Creatinine, Ser 0.74  0.50 - 1.10 mg/dL   Calcium 9.1  8.4 - 10.5 mg/dL   Total Protein 7.3  6.0 - 8.3 g/dL   Albumin 3.5  3.5 - 5.2 g/dL   AST 20  0 - 37 U/L   ALT 21  0 - 35 U/L   Alkaline Phosphatase 37 (*) 39 - 117 U/L   Total Bilirubin 0.5  0.3 - 1.2 mg/dL   GFR calc non Af Amer >90  >90 mL/min   GFR calc Af Amer >90  >90 mL/min   Comment: (NOTE)     The eGFR has been calculated using the CKD EPI equation.     This calculation has not been validated in all  clinical situations.     eGFR's persistently <90 mL/min signify possible Chronic Kidney     Disease.   Anion gap 12  5 - 15   Comment: Performed at [facility]  TSH     Status: None   Collection Time    12/05/13  7:15 PM      Result Value Ref Range   TSH 1.530  0.350 - 4.500 uIU/mL   Comment: Performed at [facility]   Psychological Evaluations:  Assessment:   DSM5:  Schizophrenia Disorders:  NA Obsessive-Compulsive Disorders:  NA Trauma-Stressor Disorders:  NA Substance/Addictive Disorders:  NA Depressive Disorders:  Major Depressive Disorder - Severe (296.23)  AXIS I:  Major Depression, Recurrent severe AXIS II:  Deferred AXIS III:   Past Medical History  Diagnosis Date  . ADHD   . CARDIOMYOPATHY, PERIPARTUM, POSTPARTUM     a. EF previously 30-40%,  b.  resolved, EF 60%    c.  echo 10/2011:  mild LVH, EF 50%, mild LAE,   . MIGRAINE HEADACHE   . OBESITY   . DEPRESSION   . Essential hypertension, benign   . CARPAL TUNNEL SYNDROME, RIGHT   . Anxiety   . Edema     a. LE venous dopplers negative for DVT 10/2011  . CHF (congestive heart failure)    AXIS IV:  economic problems, occupational problems and other psychosocial or environmental problems AXIS V:  41-50 serious symptoms  Treatment Plan/Recommendations:   Observation Level/Precautions:  15 minute checks  Laboratory:  Labs resulted, reviewed, and stable at this time.   Psychotherapy:  Group therapy, individual therapy, psychoeducation  Medications:  See MAR above  Consultations: None    Discharge Concerns: None    Estimated LOS: 5-7 days  Other:  N/A    Treatment Plan Summary: Daily contact with patient to assess and evaluate symptoms and progress in treatment Medication management Current Medications:  Current Facility-Administered Medications  Medication Dose Route Frequency Provider Last Rate Last Dose  . acetaminophen (TYLENOL) tablet 650 mg  650 mg Oral Q6H PRN Janett Labella, NP      . alum & mag hydroxide-simeth (MAALOX/MYLANTA) 200-200-20 MG/5ML suspension 30 mL  30 mL Oral Q4H PRN Janett Labella, NP      . amLODipine (NORVASC) tablet 10 mg  10 mg Oral Daily Janett Labella, NP   10 mg at 12/06/13 0925  . carvedilol (COREG) tablet 12.5 mg  12.5 mg Oral BID WC Janett Labella, NP   12.5 mg at 12/06/13 0926  . clonazePAM (KLONOPIN) tablet 1 mg  1 mg Oral TID PRN Janett Labella, NP   1 mg at 12/05/13 2315  . enalapril (VASOTEC) tablet 20 mg  20 mg Oral BID Freda Munro May Agustin, NP   20 mg at 12/06/13 5694  . furosemide (LASIX) tablet 40 mg  40 mg Oral Daily Janett Labella, NP   40 mg at 12/06/13 0925  . ibuprofen (ADVIL,MOTRIN) tablet 600 mg  600 mg Oral Q8H PRN Malena Peer, NP   600 mg at 12/05/13 2356  . lamoTRIgine (LAMICTAL) tablet 200 mg  200 mg Oral Daily Janett Labella, NP   200 mg at 12/06/13 3700  . magnesium hydroxide (MILK OF MAGNESIA) suspension 30 mL  30 mL Oral Daily PRN Freda Munro May Agustin, NP      . potassium chloride SA (K-DUR,KLOR-CON) CR tablet 20 mEq  20 mEq Oral Daily Evanna Glenda Chroman, NP   20 mEq at 12/06/13 0925  . simvastatin (ZOCOR) tablet 20 mg  20 mg Oral q1800 Janett Labella, NP       I certify that inpatient services furnished can reasonably be expected to improve the patient's condition.   AGUSTIN, [place], AGNP-BC 10/12/20159:56 AM  I have discussed case with NP and have met with patient. Agree with NP assessment and plan Patient is a 48 year old female, admitted to the hospital due to depression and anxiety in the context of severe stressors. She has a history of pregnancy induced cardiomyopathy, and has had CHF in the past. She has a teenaged son who has Asperger's syndrome and who needs significant care from her. Her husband found a job in [place] so he is most often gone from the house and she feels overwhelmed without his support.  States she has a history of ADHD and has been on  Adderall XR for years.  Presents with anxiety, depression, neuro-vegetative symptoms of depression, but is not currently suicidal and is not psychotic. Have requested Hospitalist Consult, as patient is concerned about current medications/doses for CHF/ Cardiac History- depending on report/opinion of Hospitalist will consider restarting Adderall, which patient is concerned about not being on. Will continue to monitor mood and affect.

## 2013-12-06 NOTE — Progress Notes (Signed)
Attended group 

## 2013-12-06 NOTE — BHH Group Notes (Signed)
[facility] LCSW Aftercare Discharge Planning Group Note   12/06/2013 10:55 AM  Participation Quality: limited, guarded    Mood/Affect:  Depressed and Lethargic  Depression Rating:  Unrated, called out of group  Anxiety Rating:  Unrated, called out of group  Thoughts of Suicide:  Yes, says "maybe, it gets harder every day" Will you contract for safety?   NA, called out of group for meds, CSW will follow up  Current AVH:  No  Plan for Discharge/Comments:  Had appt at [facility] for therapy, appt cancelled by txist, pt would like to be rescheduled, wants help w parenting stress due to recent diagnosis of Aspergers in 68 yo son.    Transportation Means: unsure, did not complete group  Supports: husband is "part time"  Husband, works in [place] as he cannot find job locally.  Beverely Pace

## 2013-12-06 NOTE — Progress Notes (Signed)
Patient ID: [name], female   DOB: 07-12-1965, 48 y.o.   MRN: [id number] D)  Shaila has been trying to adjust to the unit this evening, and admits to having a lot of anxiety, most of it related to her health.  She states she developed post partum cardiomyopathy, noticing SOB 4 days after she went home from the hospital and having the feeling she was going to die if she didn't get help.  States she has been able to manage the last 12 years, but was in the hospital 6 months ago with CHF, and again this month, and stated in the 6 months between the hospitalizations her EF has dropped significantly, but was unable to give a number.  She shared she is anxious about her son, 34, who has asperger's, and is afraid that she will need help and doesn't want him to find her or have to call an ambulance.  Also afraid that her husband will be in [place], where he has opened a shop, and not be there for herself and her son.  Talked for quite some time about her experiences with SOB and trying to cope here in [place] while her husband is away.  Her family is from [place] and isn't happy here, but doesn't feel they can afford to live in [place].   A)   Requested and was given klonopin for her anxiety as well as other meds ordered for this evening.  Was given time for 1:1 and validated her concerns, and was given support and reassurance that staff would be available to help her.  Was able to contract for safety.  Will continue to monitor for safety, and continue POC R)  Safety maintained.
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
## 2013-12-06 NOTE — Consult Note (Signed)
[facility] Medical Consultation  [name] NGE:[id number] DOB: June 23, 1965 DOA: 12/05/2013 PCP: Marijean Bravo, MD   Requesting physician: Dr. Sindy Messing Date of consultation: 12/06/2013 Reason for consultation: Medication reviewed/management of medical issues  Impression/Recommendations Principal Problem:   MDD (major depressive disorder), recurrent episode, severe Active Problems:   Morbid obesity   Attention deficit hyperactivity disorder (ADHD)   Uncontrolled hypertension   CARDIOMYOPATHY, PERIPARTUM, POSTPARTUM   Neck pain, acute   Cervical radicular pain   Dyslipidemia   Chronic systolic CHF (congestive heart failure), NYHA class 3  #1 recurrent major depressive disorder/anxiety Per primary team.  #2 chronic systolic heart failure NYHA class III/history of peripartum/postpartum cardiomyopathy Currently well compensated. Patient currently asymptomatic.Changes were explained to the patient and she seemed to understand and is okay with her current regimen. I agree with continuing current doses of medications that patient was discharged on including Norvasc, Coreg, enalapril, Lasix, potassium, Zocor. Followup with cardiologist on discharge.  #3 hyperlipidemia Continue Zocor current dose.  #4 hypertension Stable. Continue current regimen of Lasix, Coreg, Norvasc, enalapril  #5 ADHD Okay from a medical standpoint to resume her home regimen of Adderall.  #6 chronic neck pain/chronic cervical radicular pain Patient was on ibuprofen 800 mg 3 times daily as needed. Continue current dose.   #7 morbid obesity  #8 prophylaxis Will place on a PPI for GI prophylaxis. DVT prophylaxis per primary team.     I will followup again tomorrow. Please contact me if I can be of assistance in the meanwhile. Thank you for this consultation.  Chief Complaint: Depression/Anxiety  HPI:  Patient is a 48 year old Caucasian female with history of postpartum cardiomyopathy EF of 30-35%,  hypertension, hyperlipidemia, ADHD who was recently hospitalized 11/26/2013 to 11/28/2013 for acute on chronic systolic heart failure who was admitted to behavioral [place] for major depressive disorder and anxiety. We were consulted for management of patient's medications for her chronic medical issues. Patient denies any fevers, no chills, no nausea, no vomiting, no abdominal pain, no chest pain, no shortness of breath, no paroxysmal nocturnal dyspnea, no orthopnea, no dysuria, no abdominal pain, no diarrhea, no constipation, no weakness. Patient states was on higher doses of Coreg prior to her hospitalization however was not compliant with these. Patient states when she was discharged it was explained to her that patient was not compliant with her medications and a such she was discharged on a lower dose which may then be possibly titrated at a later date. Patient is somewhat concerned that she may need to be on a higher dose that she was supposed to be on prior to her hospitalization for her CHF exacerbation. Patient also concerned that she has not been resumed back on Adderall.  Review of Systems: As per history of present illness otherwise negative As per history of present illness otherwise negative.  Past Medical History  Diagnosis Date  . ADHD   . CARDIOMYOPATHY, PERIPARTUM, POSTPARTUM     a. EF previously 30-40%,  b.  resolved, EF 60%    c.  echo 10/2011:  mild LVH, EF 50%, mild LAE,   . MIGRAINE HEADACHE   . OBESITY   . DEPRESSION   . Essential hypertension, benign   . CARPAL TUNNEL SYNDROME, RIGHT   . Anxiety   . Edema     a. LE venous dopplers negative for DVT 10/2011  . CHF (congestive heart failure)   . Chronic systolic CHF (congestive heart failure), NYHA class 3 12/06/2013   Past Surgical History  Procedure Laterality Date  . Child birth  2003  . Cholecystectomy     Social History:  reports that she quit smoking about 16 years ago. She does not have any smokeless  tobacco history on file. She reports that she does not drink alcohol or use illicit drugs.  Allergies  Allergen Reactions  . Duloxetine     REACTION: Sucidal thoughts  . Penicillins     REACTION: rash  . Prozac [Fluoxetine Hcl] Other (See Comments)    More depressed   Family History  Problem Relation Age of Onset  . Hypertension Other   . Diabetes Other   . Colitis Other   . Alcohol abuse Other     Prior to Admission medications   Medication Sig Start Date End Date Taking? Authorizing Provider  amLODipine (NORVASC) 10 MG tablet Take 1 tablet (10 mg total) by mouth daily. 11/28/13  Yes Luke K Kilroy, PA-C  amphetamine-dextroamphetamine (ADDERALL XR) 30 MG 24 hr capsule Take 30 mg by mouth daily.   Yes Historical Provider, MD  carvedilol (COREG) 12.5 MG tablet Take 1 tablet (12.5 mg total) by mouth 2 (two) times daily with a meal. 11/28/13  Yes Luke K Kilroy, PA-C  clonazePAM (KLONOPIN) 1 MG tablet Take 1 mg by mouth 3 (three) times daily as needed for anxiety.  06/08/13  Yes Historical Provider, MD  enalapril (VASOTEC) 20 MG tablet Take 1 tablet (20 mg total) by mouth 2 (two) times daily. 11/28/13  Yes Luke K Kilroy, PA-C  furosemide (LASIX) 40 MG tablet Take 1 tablet (40 mg total) by mouth daily. 11/28/13  Yes Luke K Kilroy, PA-C  ibuprofen (ADVIL,MOTRIN) 800 MG tablet Take 800 mg by mouth every 8 (eight) hours as needed for headache or mild pain.  05/14/13  Yes Historical Provider, MD  lamoTRIgine (LAMICTAL) 200 MG tablet Take 200 mg by mouth daily.   Yes Historical Provider, MD  potassium chloride SA (K-DUR,KLOR-CON) 20 MEQ tablet Take 1 tablet (20 mEq total) by mouth daily. 11/28/13  Yes Luke K Kilroy, PA-C  simvastatin (ZOCOR) 20 MG tablet Take 1 tablet (20 mg total) by mouth daily. 11/28/13  Yes Erlene Quan, PA-C  acetaminophen (TYLENOL) 325 MG tablet Take 2 tablets (650 mg total) by mouth every 4 (four) hours as needed for headache or mild pain. 11/28/13   Erlene Quan, PA-C    Physical Exam: Blood pressure 110/75, pulse 89, temperature 97.8 F (36.6 C), temperature source Oral, resp. rate 16, height 5\' 4"  (1.626 m), weight 131.543 kg (290 lb), last menstrual period 11/24/2013, SpO2 98.00%. Filed Vitals:   12/06/13 1301  BP: 110/75  Pulse: 89  Temp:   Resp:      General:  Well-developed well-nourished in no acute cardiopulmonary distress. Speaking in full sentences.  Eyes: PERRLA. EOMI  ENT: Oropharynx is clear, no lesions, no exudates. Neck is supple with no lymphadenopathy.  Neck: Supple with no lymphadenopathy.  Cardiovascular: Regular rate rhythm no murmurs rubs or gallops. Distant heart sounds secondary to body habitus.  Respiratory: Lungs clear to auscultation bilaterally. No wheezes, no crackles, no rhonchi.  Abdomen: Soft, nontender, nondistended, positive bowel sounds.  Skin: No rashes or lesions.  Musculoskeletal: 5 out of 5 bilateral upper extremity strength. 5 out of 5 bilateral lower extremity strength.  Psychiatric: Alert. Fair mood. Fair affect. Fair insight. Fair judgment.  Neurologic: Alert and oriented x3. Cranial nerves II through XII are grossly intact. No focal deficits.  Labs on Admission:  Basic Metabolic  Panel:  Recent Labs Lab 12/05/13 1915  NA 142  K 4.0  CL 103  CO2 27  GLUCOSE 120*  BUN 18  CREATININE 0.74  CALCIUM 9.1   Liver Function Tests:  Recent Labs Lab 12/05/13 1915  AST 20  ALT 21  ALKPHOS 37*  BILITOT 0.5  PROT 7.3  ALBUMIN 3.5   No results found for this basename: LIPASE, AMYLASE,  in the last 168 hours No results found for this basename: AMMONIA,  in the last 168 hours CBC:  Recent Labs Lab 12/05/13 1915  WBC 10.2  HGB 14.3  HCT 43.6  MCV 84.7  PLT 318   Cardiac Enzymes: No results found for this basename: CKTOTAL, CKMB, CKMBINDEX, TROPONINI,  in the last 168 hours BNP: No components found with this basename: POCBNP,  CBG: No results found for this basename: GLUCAP,  in  the last 168 hours  Radiological Exams on Admission: No results found.  EKG: None  Time spent: [place] MD [facility] Pager [phone]  If 7PM-7AM, please contact night-coverage [email or web address] Password [email or web address] 12/06/2013, 4:08 PM

## 2013-12-07 MED ORDER — SERTRALINE HCL 50 MG PO TABS
50.0000 mg | ORAL_TABLET | Freq: Every day | ORAL | Status: DC
  Administered 2013-12-07 – 2013-12-08 (×2): 50 mg via ORAL
  Filled 2013-12-07 (×2): qty 1
  Filled 2013-12-07: qty 3
  Filled 2013-12-07 (×2): qty 1

## 2013-12-07 NOTE — Progress Notes (Signed)
Patient ID: [name], female   DOB: September 18, 1965, 48 y.o.   MRN: [id number] She has been up and about interacting more today with peers and staff. Has requested one prn for anxiety this afternoon. Has been calmer pleasant today. Spoke of having a family meeting tomorrow.

## 2013-12-07 NOTE — Progress Notes (Signed)
Pt did not attend AA meeting during evening group time, despite being prompted to do so by staff.

## 2013-12-07 NOTE — Progress Notes (Signed)
[facility] MD Progress Note  12/07/2013 12:47 PM [name]  MRN:  [id number] Subjective: States that she feels anxious and subjectively depressed , but  Acknowledges mood is better. She states she is hoping to discharge soon. Objective: I have discussed case with treatment team and have met with patient. As per notes, patient has been intermittently labile and irritable. She has been going to groups and is visible in milieu.  Husband visited yesterday. Patient states that husband being in town currently ( normally husband is in [place]) is very reassuring and is helping her feel better. Patient has  has continued to report significant anxiety, and tends to ruminate about her stressors.  She also describes some depression and is hoping for " some antidepressant that will help my mood and my anxiety symptoms" She states that in the past Prozac did not work, felt more depressed- of note, she listed it as allergy upon admission, but at this time DENIES any actual side effect or allergy from Prozac, simply that it did not work for her. She does remember having tried Zoloft years ago and does not remember having had side effects or problems with it.  Appreciate Hospitalist Consult- continuing current doses of cardiac medications advised.  Patient has been restarted on adderall, which she states she has been on for years for ADHD and without which she has difficulty focusing and completing tasks. At this time denies medication side effects.   Diagnosis:  Major Depression, ADHD by History  Total Time spent with patient: 25 minutes    ADL's: improved grooming  Sleep: fair - wakes up often  Appetite:  Good   Suicidal Ideation:  Currently denies any suicidal ideations Homicidal Ideation:  Currently denies any homicidal ideations AEB (as evidenced by):  Psychiatric Specialty Exam: Physical Exam  Review of Systems  Constitutional: Negative for fever and chills.  Eyes: Negative.   Respiratory: Negative  for cough, shortness of breath and wheezing.   Cardiovascular: Negative for chest pain.  Gastrointestinal: Negative for nausea, vomiting, abdominal pain and diarrhea.  Genitourinary: Negative for dysuria, urgency and frequency.  Skin: Negative for rash.  Neurological: Negative for headaches.  Psychiatric/Behavioral: Positive for depression. Negative for suicidal ideas and hallucinations. The patient is nervous/anxious.     Blood pressure 126/84, pulse 88, temperature 97.8 F (36.6 C), temperature source Oral, resp. rate 20, height 5' 4"  (1.626 m), weight 131.543 kg (290 lb), last menstrual period 11/24/2013, SpO2 98.00%.Body mass index is 49.75 kg/(m^2).  General Appearance: improved grooming   Eye Contact::  Good  Speech:  Normal Rate  Volume:  Normal  Mood:  improving  Affect:  more reactive, smiles often  Thought Process:  Goal Directed and Linear  Orientation:  Other:  fully alert and attentive   Thought Content:  denies hallucinations, no delusions, ruminates about her stressors, particularly the stress of being a single parent to her son, as hier husband was out of state.  Suicidal Thoughts:  No  Homicidal Thoughts:  No  Memory:  recent and remote grossly intact   Judgement:  Fair  Insight:  Fair  Psychomotor Activity:  Normal  Concentration:  Good  Recall:  Good  Fund of Knowledge:Good  Language: Good  Akathisia:  No  Handed:  Right  AIMS (if indicated):     Assets:  Communication Skills Desire for Improvement Resilience Social Support  Sleep:  Number of Hours: 4.75   Musculoskeletal: Strength & Muscle Tone: within normal limits Gait & Station: normal Patient leans:  N/A  Current Medications: Current Facility-Administered Medications  Medication Dose Route Frequency Provider Last Rate Last Dose  . acetaminophen (TYLENOL) tablet 650 mg  650 mg Oral Q6H PRN Janett Labella, NP      . alum & mag hydroxide-simeth (MAALOX/MYLANTA) 200-200-20 MG/5ML suspension 30 mL   30 mL Oral Q4H PRN Janett Labella, NP   30 mL at 12/06/13 2323  . amLODipine (NORVASC) tablet 10 mg  10 mg Oral Daily Janett Labella, NP   10 mg at 12/07/13 0846  . amphetamine-dextroamphetamine (ADDERALL XR) 24 hr capsule 30 mg  30 mg Oral Daily Eugenie Filler, MD   30 mg at 12/07/13 0846  . carvedilol (COREG) tablet 12.5 mg  12.5 mg Oral BID WC Janett Labella, NP   12.5 mg at 12/07/13 0847  . clonazePAM (KLONOPIN) tablet 1 mg  1 mg Oral TID PRN Janett Labella, NP   1 mg at 12/06/13 2220  . enalapril (VASOTEC) tablet 20 mg  20 mg Oral BID Freda Munro May Agustin, NP   20 mg at 12/07/13 0846  . furosemide (LASIX) tablet 40 mg  40 mg Oral Daily Janett Labella, NP   40 mg at 12/07/13 0846  . ibuprofen (ADVIL,MOTRIN) tablet 800 mg  800 mg Oral Q8H PRN Neita Garnet, MD   800 mg at 12/06/13 1343  . lamoTRIgine (LAMICTAL) tablet 200 mg  200 mg Oral Daily Janett Labella, NP   200 mg at 12/07/13 0846  . magnesium hydroxide (MILK OF MAGNESIA) suspension 30 mL  30 mL Oral Daily PRN Freda Munro May Agustin, NP      . pantoprazole (PROTONIX) EC tablet 40 mg  40 mg Oral Q0600 Irine Seal V, MD      . potassium chloride SA (K-DUR,KLOR-CON) CR tablet 20 mEq  20 mEq Oral Daily Evanna Glenda Chroman, NP   20 mEq at 12/07/13 0846  . simvastatin (ZOCOR) tablet 20 mg  20 mg Oral q1800 Janett Labella, NP   20 mg at 12/06/13 5809    Lab Results:  Results for orders placed during the hospital encounter of 12/05/13 (from the past 48 hour(s))  CBC     Status: Abnormal   Collection Time    12/05/13  7:15 PM      Result Value Ref Range   WBC 10.2  4.0 - 10.5 K/uL   RBC 5.15 (*) 3.87 - 5.11 MIL/uL   Hemoglobin 14.3  12.0 - 15.0 g/dL   HCT 43.6  36.0 - 46.0 %   MCV 84.7  78.0 - 100.0 fL   MCH 27.8  26.0 - 34.0 pg   MCHC 32.8  30.0 - 36.0 g/dL   RDW 14.9  11.5 - 15.5 %   Platelets 318  150 - 400 K/uL   Comment: Performed at [facility] PANEL      Status: Abnormal   Collection Time    12/05/13  7:15 PM      Result Value Ref Range   Sodium 142  137 - 147 mEq/L   Potassium 4.0  3.7 - 5.3 mEq/L   Chloride 103  96 - 112 mEq/L   CO2 27  19 - 32 mEq/L   Glucose, Bld 120 (*) 70 - 99 mg/dL   BUN 18  6 - 23 mg/dL   Creatinine, Ser 0.74  0.50 - 1.10 mg/dL   Calcium 9.1  8.4 - 10.5 mg/dL   Total Protein 7.3  6.0 - 8.3 g/dL   Albumin 3.5  3.5 - 5.2 g/dL   AST 20  0 - 37 U/L   ALT 21  0 - 35 U/L   Alkaline Phosphatase 37 (*) 39 - 117 U/L   Total Bilirubin 0.5  0.3 - 1.2 mg/dL   GFR calc non Af Amer >90  >90 mL/min   GFR calc Af Amer >90  >90 mL/min   Comment: (NOTE)     The eGFR has been calculated using the CKD EPI equation.     This calculation has not been validated in all clinical situations.     eGFR's persistently <90 mL/min signify possible Chronic Kidney     Disease.   Anion gap 12  5 - 15   Comment: Performed at [facility]  TSH     Status: None   Collection Time    12/05/13  7:15 PM      Result Value Ref Range   TSH 1.530  0.350 - 4.500 uIU/mL   Comment: Performed at [facility]    Physical Findings: AIMS: Facial and Oral Movements Muscles of Facial Expression: None, normal Lips and Perioral Area: None, normal Jaw: None, normal Tongue: None, normal,Extremity Movements Upper (arms, wrists, hands, fingers): None, normal Lower (legs, knees, ankles, toes): None, normal, Trunk Movements Neck, shoulders, hips: None, normal, Overall Severity Severity of abnormal movements (highest score from questions above): None, normal Incapacitation due to abnormal movements: None, normal Patient's awareness of abnormal movements (rate only patient's report): No Awareness, Dental Status Current problems with teeth and/or dentures?: No Does patient usually wear dentures?: No  CIWA:    COWS:     Assessment: Patient is improved , she presents with improved mood and affect, and is focusing more on being  discharged soon. The fact that her husband is now in town, and they are working on options that will not separate him from her, is helping her to feel better. She does, however, describe chronic anxiety and depression and is interested in an SSRI  To address this. As above, remembers a prior zoloft trial as well tolerated.  Denies any side effects from Adderall/ Lamictal, and vitals are stable.    Treatment Plan Summary: Daily contact with patient to assess and evaluate symptoms and progress in treatment Medication management See below   Plan: Continue inpatient treatment  Lamictal 200 mgrs QDAY Adderall XR 30 mgrs QDAY Start Zoloft 50 mgrs QDAY - side effects and rationale discussed  Klonopin PRNS for severe anxiety Consider discharge soon as she continues to improve - treatment team working on disposition planning     Medical Decision Making Problem Points:  Established problem, stable/improving (1), Review of last therapy session (1) and Review of psycho-social stressors (1) Data Points:  Review or order clinical lab tests (1) Review of medication regiment & side effects (2) Review of new medications or change in dosage (2)  I certify that inpatient services furnished can reasonably be expected to improve the patient's condition.   Latravion Graves 12/07/2013, 12:47 PM

## 2013-12-07 NOTE — Progress Notes (Signed)
Recreation Therapy Notes  Animal-Assisted Activity/Therapy (AAA/T) Program Checklist/Progress Notes Patient Eligibility Criteria Checklist & Daily Group note for Rec Tx Intervention  Date: 10.13.2015 Time: 2:45pm Location: [address]   AAA/T Program Assumption of Risk Form signed by Patient/ or Parent Legal Guardian yes  Patient is free of allergies or sever asthma yes  Patient reports no fear of animals yes  Patient reports no history of cruelty to animals yes   Patient understands his/her participation is voluntary yes  Patient washes hands before animal contact yes  Patient washes hands after animal contact yes  Behavioral Response: Appropriate   Education: Hand Washing, Appropriate Animal Interaction   Education Outcome: Acknowledges education.   Clinical Observations/Feedback: Patient arrived to session during last 10 minutes. While in group session patient was engaged and attentive, petting therapy dog appropriate, asking appropriate questions about therapy dog and his training and sharing stories about her pets at home. 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    Laureen Ochs Yarelin Reichardt, LRT/CTRS  Lane Hacker 12/07/2013 4:51 PM

## 2013-12-07 NOTE — BHH Group Notes (Signed)
[facility] LCSW Group Therapy  12/07/2013   1:15 PM   Type of Therapy:  Group Therapy  Participation Level:  Active  Participation Quality:  Attentive, Sharing and Supportive  Affect:  Depressed and Flat  Cognitive:  Alert and Oriented  Insight:  Developing/Improving and Engaged  Engagement in Therapy:  Developing/Improving and Engaged  Modes of Intervention:  Clarification, Confrontation, Discussion, Education, Exploration, Limit-setting, Orientation, Problem-solving, Rapport Building, Art therapist, Socialization and Support  Summary of Progress/Problems: The topic for group therapy was feelings about diagnosis.  Pt actively participated in group discussion on their past and current diagnosis and how they feel towards this.  Pt also identified how society and family members judge them, based on their diagnosis as well as stereotypes and stigmas.  Patient discussed the difficulty of living with ADD as well as her frustrations regarding finding appropriate and effective outpatient services. CSW's and other group members provided patient with emotional support and encouragement.  Tilden Fossa, MSW, [place] Worker [facility] [phone]

## 2013-12-07 NOTE — Progress Notes (Signed)
Nothing further to add at this time. Will sign off. Call with questions.
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
## 2013-12-07 NOTE — Tx Team (Signed)
Interdisciplinary Treatment Plan Update (Adult) Date: 12/07/2013   Time Reviewed: 9:30 AM  Progress in Treatment: Attending groups: Patient new to milieu, CSW continuing to assess  Participating in groups: Patient new to milieu, CSW continuing to assess Taking medication as prescribed: Yes Tolerating medication: Yes Family/Significant other contact made: No, CSW continuing to assess for appropriate collateral contact Patient understands diagnosis: Yes Discussing patient identified problems/goals with staff: Yes Medical problems stabilized or resolved: Yes Denies suicidal/homicidal ideation: Treatment team continuing to assess Issues/concerns per patient self-inventory: Yes Other:  New problem(s) identified: N/A  Discharge Plan or Barriers: CSW continuing to assess.  Reason for Continuation of Hospitalization:  Depression Anxiety Medication Stabilization   Comments: N/A  Estimated length of stay: 3-5 days  For review of initial/current patient goals, please see plan of care. Patient received from [facility] directly. She called 911 with SI. She has history of depression. She was received at [facility] and patient was highly anxious and she was covering her face crying. She has extensive cardiac history of Obesity/Hypertension/cardiomyopathy/CHF and was just recently admitted to [facility] on 10/3 for progressive dyspnea and anxiety. She was discharged 11/28/13 with cardiology follow up.   Attendees: Patient:    Family:    Physician: Dr. Parke Poisson; Dr. Sabra Heck 12/07/2013 9:30 AM  Nursing: Darrol Angel; Satira Sark, RN 12/07/2013 9:30 AM  Clinical Social Worker: Tilden Fossa,  [place] 12/07/2013 9:30 AM  Other: Joette Catching, LCSW 12/07/2013 9:30 AM  Other: Marijo Sanes Liaison 12/07/2013 9:30 AM  Other: Lars Pinks, Case Manager 12/07/2013 9:30 AM  Other:  12/07/2013 9:30 AM  Other:    Other:    Other:    Other:    Other:         Scribe for Treatment Team:   Tilden Fossa, MSW, [employer] [phone]

## 2013-12-07 NOTE — Progress Notes (Signed)
Pt has been anxious and irritable most of the evening.  At the beginning of the shift, her husband was visiting, and she was crying when he left.  He was going home to get some things that she said she needed.  When he returned, writer was asked to go out to the lobby and go through the things so he could take what she could not have back home.  Pt had specifically asked about glasses and husband had brought three pair, along with her purse and some undergarments.  Writer went through items and brought what pt could have back to the unit.  Pt was irritable that she could not have some of the items and became argumentative with Probation officer and MHT on the hall.  Pt was reminded of the rules and told that her husband could bring the items back in the morning and enquire of administration about them.  Later, pt was given her prn Klonopin for bedtime and also Mylanta for indigestion.  Pt denies SI/HI/AV at this time.  Discharge plans are in process.  Pt medicated as ordered.  Support and encouragement offered.  Safety maintained with q15 minute checks.

## 2013-12-08 MED ORDER — LAMOTRIGINE 200 MG PO TABS: 200.0000 mg | ORAL_TABLET | Freq: Every day | ORAL | Status: DC

## 2013-12-08 MED ORDER — AMPHETAMINE-DEXTROAMPHET ER 30 MG PO CP24: 30.0000 mg | ORAL_CAPSULE | Freq: Every day | ORAL | Status: DC

## 2013-12-08 MED ORDER — SERTRALINE HCL 50 MG PO TABS: 50.0000 mg | ORAL_TABLET | Freq: Every day | ORAL | Status: DC

## 2013-12-08 MED ORDER — ENALAPRIL MALEATE 20 MG PO TABS: 20.0000 mg | ORAL_TABLET | Freq: Two times a day (BID) | ORAL | Status: DC

## 2013-12-08 MED ORDER — POTASSIUM CHLORIDE CRYS ER 20 MEQ PO TBCR: 20.0000 meq | EXTENDED_RELEASE_TABLET | Freq: Every day | ORAL | Status: DC

## 2013-12-08 MED ORDER — FUROSEMIDE 40 MG PO TABS: 40.0000 mg | ORAL_TABLET | Freq: Every day | ORAL | Status: DC

## 2013-12-08 MED ORDER — CARVEDILOL 12.5 MG PO TABS: 12.5000 mg | ORAL_TABLET | Freq: Two times a day (BID) | ORAL | Status: DC

## 2013-12-08 MED ORDER — SIMVASTATIN 20 MG PO TABS: 20.0000 mg | ORAL_TABLET | Freq: Every day | ORAL | Status: DC

## 2013-12-08 MED ORDER — AMLODIPINE BESYLATE 10 MG PO TABS: 10.0000 mg | ORAL_TABLET | Freq: Every day | ORAL | Status: DC

## 2013-12-08 NOTE — Progress Notes (Signed)
Pt reports she has had a better day today.  She says she has attended groups and feels her thoughts are more positive.  Pt appears to be brighter today.  Pt denies SI/HI/AV.  Pt reports to this writer that she is having a family meeting tomorrow, and then will probably be discharged.  Pt feels her meds are working for her, although she does report that the Coreg she is being given here is the generic and she does not tolerate the generic well.  Writer put a MD note in that pt needs the Coreg to be brand name.  Pt is taking Ibuprofen for her back pain and alternating with the tylenol.  Pt voices no other needs at this time.  Support and encouragement offered.  Safety maintained with q15 minute checks.

## 2013-12-08 NOTE — BHH Group Notes (Signed)
   [facility] LCSW Aftercare Discharge Planning Group Note  12/08/2013  8:45 AM   Participation Quality: Alert, Appropriate and Oriented  Mood/Affect: Appropriate  Depression Rating: Unable to rate with number but reports that she experiences no to little depression at this time  Anxiety Rating: Unable to rate with number but reports that she feels anxious to return home today  Thoughts of Suicide: Pt denies SI/HI  Will you contract for safety? Yes  Current AVH: Pt denies  Plan for Discharge/Comments: Pt attended discharge planning group and actively participated in group. CSW provided pt with today's workbook. Patient reports that she feels well today and is ready to discharge home with her family. Patient agreeable to follow up with Dr. Darylene Price at [facility] for medication management.   Transportation Means: Pt reports access to transportation  Supports: Patient's husband has been identified as a support.  Tilden Fossa, MSW, [place] Worker [facility] [phone]

## 2013-12-08 NOTE — Plan of Care (Signed)
Problem: Alteration in mood Goal: LTG-Patient reports reduction in suicidal thoughts (Patient reports reduction in suicidal thoughts and is able to verbalize a safety plan for whenever patient is feeling suicidal)  Outcome: Completed/Met Date Met:  12/08/13 Pt denies suicidal thoughts at this time.

## 2013-12-08 NOTE — Discharge Summary (Signed)
Physician Discharge Summary Note  Patient:  [name] is an 48 y.o., female MRN:  [id number] DOB:  1965-09-27 Patient phone:  [phone] (home)  Patient address:   [place] [address],  Total Time spent with patient: 30 minutes  Date of Admission:  12/05/2013 Date of Discharge: 12/08/13  Reason for Admission:  Depression  Discharge Diagnoses: Principal Problem:   MDD (major depressive disorder), recurrent episode, severe Active Problems:   Morbid obesity   Attention deficit hyperactivity disorder (ADHD)   Uncontrolled hypertension   CARDIOMYOPATHY, PERIPARTUM, POSTPARTUM   Neck pain, acute   Cervical radicular pain   Dyslipidemia   Chronic systolic CHF (congestive heart failure), NYHA class 3  See Physician SRA Psychiatric Specialty Exam: Physical Exam  Psychiatric: She has a normal mood and affect. Her speech is normal and behavior is normal. Judgment and thought content normal. Cognition and memory are normal.    Review of Systems  Constitutional: Negative.   HENT: Negative.   Eyes: Negative.   Respiratory: Negative.   Cardiovascular: Negative.   Gastrointestinal: Negative.   Genitourinary: Negative.   Musculoskeletal: Negative.   Skin: Negative.   Neurological: Negative.   Endo/Heme/Allergies: Negative.   Psychiatric/Behavioral: Positive for depression (Stable with treatment ). The patient is nervous/anxious (Stable with treatment ).     Blood pressure 121/82, pulse 79, temperature 98.2 F (36.8 C), temperature source Oral, resp. rate 20, height 5' 4"  (1.626 m), weight 131.543 kg (290 lb), last menstrual period 11/24/2013, SpO2 98.00%.Body mass index is 49.75 kg/(m^2).   Past Psychiatric History: See H&P Diagnosis:  Hospitalizations:  Outpatient Care:  Substance Abuse Care:  Self-Mutilation:  Suicidal Attempts:  Violent Behaviors:   Musculoskeletal: Strength & Muscle Tone: within normal limits Gait & Station:  normal Patient leans: N/A  DSM5: Axis Diagnosis:  AXIS I: Major Depression, without psychotic symptoms, versus Adjustment Disorder with Depressed Mood . ADHD by history.  AXIS II: Deferred  AXIS III:  Past Medical History   Diagnosis  Date   .  ADHD    .  CARDIOMYOPATHY, PERIPARTUM, POSTPARTUM      a. EF previously 30-40%, b. resolved, EF 60% c. echo 10/2011: mild LVH, EF 50%, mild LAE,   .  MIGRAINE HEADACHE    .  OBESITY    .  DEPRESSION    .  Essential hypertension, benign    .  CARPAL TUNNEL SYNDROME, RIGHT    .  Anxiety    .  Edema      a. LE venous dopplers negative for DVT 10/2011   .  CHF (congestive heart failure)    .  Chronic systolic CHF (congestive heart failure), NYHA class 3  12/06/2013    AXIS IV: chronic medical illness, son has Asperger's Syndrome, husband out of state for work  AXIS V: 51-60 moderate symptoms   Level of Care:  OP  Hospital Course: [name] is a 48 year old female, admitted to the hospital due to depression and anxiety in the context of severe stressors. She has a history of pregnancy induced cardiomyopathy, and has had CHF in the past. She has a teenaged son who has Asperger's syndrome and who needs significant care from her. Her husband found a job in [place] so he is most often gone from the house and she feels overwhelmed without his support. States she has a history of ADHD and has been on Adderall XR for years. Presents with anxiety, depression, neuro-vegetative symptoms of  depression, but is not currently suicidal and is not psychotic.         [name] was admitted to the adult 300 unit where she was evaluated and her symptoms were identified. Medication management was discussed and implemented. Her home medications of Lamictal, Adderrall XR, and Klonopin was continued. She was started on Zoloft 50 mg daily for symptoms of depression and anxiety. The patient had reported that the antidepressant Prozac had not worked for her in the past.  Patient clarified that she actually did not have an allergy to Prozac as listed. She was encouraged to participate in unit programming. Medical problems were identified and treated appropriately. Patient was seen by a cardiologist to evaluate the appropriateness of her medications for her history of congestive heart failure.  Home medication was restarted as needed. She was evaluated each day by a clinical provider to ascertain the patient's response to treatment.  Improvement was noted by the patient's report of decreasing symptoms, improved sleep and appetite, affect, medication tolerance, behavior, and participation in unit programming.  The patient was asked each day to complete a self inventory noting mood, mental status, pain, new symptoms, anxiety and concerns.         She responded well to medication and being in a therapeutic and supportive environment. Positive and appropriate behavior was noted and the patient was motivated for recovery.  She worked closely with the treatment team and case manager to develop a discharge plan with appropriate goals. Coping skills, problem solving as well as relaxation therapies were also part of the unit programming.         By the day of discharge she was in much improved condition than upon admission. Patient was also feeling less stress due to her husband being back in town to provide more support. Symptoms were reported as significantly decreased or resolved completely. The patient denied SI/HI and voiced no AVH. She was motivated to continue taking medication with a goal of continued improvement in mental health. [name] was discharged home with a plan to follow up as noted below.  Consults:  cardiology  Significant Diagnostic Studies:  Chemistry panel, elevated BNP ordered by cardiology, CBC  Discharge Vitals:   Blood pressure 121/82, pulse 79, temperature 98.2 F (36.8 C), temperature source Oral, resp. rate 20, height 5' 4"  (1.626 m), weight 131.543  kg (290 lb), last menstrual period 11/24/2013, SpO2 98.00%. Body mass index is 49.75 kg/(m^2). Lab Results:   Results for orders placed during the hospital encounter of 12/05/13 (from the past 72 hour(s))  CBC     Status: Abnormal   Collection Time    12/05/13  7:15 PM      Result Value Ref Range   WBC 10.2  4.0 - 10.5 K/uL   RBC 5.15 (*) 3.87 - 5.11 MIL/uL   Hemoglobin 14.3  12.0 - 15.0 g/dL   HCT 43.6  36.0 - 46.0 %   MCV 84.7  78.0 - 100.0 fL   MCH 27.8  26.0 - 34.0 pg   MCHC 32.8  30.0 - 36.0 g/dL   RDW 14.9  11.5 - 15.5 %   Platelets 318  150 - 400 K/uL   Comment: Performed at [facility] PANEL     Status: Abnormal   Collection Time    12/05/13  7:15 PM      Result Value Ref Range   Sodium 142  137 - 147 mEq/L   Potassium 4.0  3.7 - 5.3 mEq/L   Chloride 103  96 - 112 mEq/L   CO2 27  19 - 32 mEq/L   Glucose, Bld 120 (*) 70 - 99 mg/dL   BUN 18  6 - 23 mg/dL   Creatinine, Ser 0.74  0.50 - 1.10 mg/dL   Calcium 9.1  8.4 - 10.5 mg/dL   Total Protein 7.3  6.0 - 8.3 g/dL   Albumin 3.5  3.5 - 5.2 g/dL   AST 20  0 - 37 U/L   ALT 21  0 - 35 U/L   Alkaline Phosphatase 37 (*) 39 - 117 U/L   Total Bilirubin 0.5  0.3 - 1.2 mg/dL   GFR calc non Af Amer >90  >90 mL/min   GFR calc Af Amer >90  >90 mL/min   Comment: (NOTE)     The eGFR has been calculated using the CKD EPI equation.     This calculation has not been validated in all clinical situations.     eGFR's persistently <90 mL/min signify possible Chronic Kidney     Disease.   Anion gap 12  5 - 15   Comment: Performed at [facility]  TSH     Status: None   Collection Time    12/05/13  7:15 PM      Result Value Ref Range   TSH 1.530  0.350 - 4.500 uIU/mL   Comment: Performed at [facility]    Physical Findings: AIMS: Facial and Oral Movements Muscles of Facial Expression: None, normal Lips and Perioral Area: None, normal Jaw: None, normal Tongue:  None, normal,Extremity Movements Upper (arms, wrists, hands, fingers): None, normal Lower (legs, knees, ankles, toes): None, normal, Trunk Movements Neck, shoulders, hips: None, normal, Overall Severity Severity of abnormal movements (highest score from questions above): None, normal Incapacitation due to abnormal movements: None, normal Patient's awareness of abnormal movements (rate only patient's report): No Awareness, Dental Status Current problems with teeth and/or dentures?: No Does patient usually wear dentures?: No  CIWA:    COWS:     Psychiatric Specialty Exam: See Psychiatric Specialty Exam and Suicide Risk Assessment completed by Attending Physician prior to discharge.  Discharge destination:  Home  Is patient on multiple antipsychotic therapies at discharge:  No   Has Patient had three or more failed trials of antipsychotic monotherapy by history:  No  Recommended Plan for Multiple Antipsychotic Therapies: NA      Discharge Instructions   Discharge instructions    Complete by:  As directed   Please follow up with your Primary Care Provider for further management of medical problems such as Hypertension.            Medication List       Indication   amLODipine 10 MG tablet  Commonly known as:  NORVASC  Take 1 tablet (10 mg total) by mouth daily.   Indication:  High Blood Pressure     amphetamine-dextroamphetamine 30 MG 24 hr capsule  Commonly known as:  ADDERALL XR  Take 1 capsule (30 mg total) by mouth daily.   Indication:  Attention Deficit Hyperactivity Disorder     carvedilol 12.5 MG tablet  Commonly known as:  COREG  Take 1 tablet (12.5 mg total) by mouth 2 (two) times daily with a meal.   Indication:  High Blood Pressure of Unknown Cause     clonazePAM 1 MG tablet  Commonly known as:  KLONOPIN  Take 1 mg by mouth 3 (three)  times daily as needed for anxiety.      enalapril 20 MG tablet  Commonly known as:  VASOTEC  Take 1 tablet (20 mg total)  by mouth 2 (two) times daily.   Indication:  High Blood Pressure     furosemide 40 MG tablet  Commonly known as:  LASIX  Take 1 tablet (40 mg total) by mouth daily.   Indication:  Edema     ibuprofen 800 MG tablet  Commonly known as:  ADVIL,MOTRIN  Take 800 mg by mouth every 8 (eight) hours as needed for headache or mild pain.      lamoTRIgine 200 MG tablet  Commonly known as:  LAMICTAL  Take 1 tablet (200 mg total) by mouth daily.   Indication:  Mood Stabilization     potassium chloride SA 20 MEQ tablet  Commonly known as:  K-DUR,KLOR-CON  Take 1 tablet (20 mEq total) by mouth daily.   Indication:  Low Amount of Potassium in the Blood     sertraline 50 MG tablet  Commonly known as:  ZOLOFT  Take 1 tablet (50 mg total) by mouth daily.   Indication:  Major Depressive Disorder     simvastatin 20 MG tablet  Commonly known as:  ZOCOR  Take 1 tablet (20 mg total) by mouth daily.   Indication:  Inherited Heterozygous Hypercholesterolemia       Follow-up Information   Follow up with [facility] On 12/21/2013. (Therapy appointment with Dr. Elita Boone at 11:15 am. Please call office if you need to reschedule.)    Contact information:   [address] [place], [place] [address] Appointments: [phone]       Follow up with [facility] On 01/06/2014. (Please follow up with Crystal Monegue for medication management appointment at 10:45 am. Please call office if you need to reschedule.)    Contact information:   Address: [address] [place] [place], [place] [address] [phone]     Follow-up recommendations:   Activity: As tolerated  Diet: Heart Healthy  Tests: NA  Other: See below   Comments:   Take all your medications as prescribed by your mental healthcare provider.  Report any adverse effects and or reactions from your medicines to your outpatient provider promptly.  Patient is instructed and cautioned to not  engage in alcohol and or illegal drug use while on prescription medicines.  In the event of worsening symptoms, patient is instructed to call the crisis hotline, 911 and or go to the nearest ED for appropriate evaluation and treatment of symptoms.  Follow-up with your primary care provider for your other medical issues, concerns and or health care needs.   Total Discharge Time:  Greater than 30 minutes.  SignedElmarie Shiley NP-C 12/08/2013, 3:19 PM  Patient seen, Suicide Assessment Completed.  Disposition Plan Reviewed

## 2013-12-08 NOTE — Clinical Social Work Note (Signed)
CSW attempted to contact patient's Husband [name] [phone], no answer. CSW left voicemail, awaiting return call.  Tilden Fossa, MSW, [place] Worker [facility] [phone]

## 2013-12-08 NOTE — BHH Group Notes (Signed)
[place] LCSW Group Therapy 12/08/2013  1:15 PM   Type of Therapy: Group Therapy  Participation Level: Did Not Attend.   Tilden Fossa, MSW, [place] Worker [facility] [phone]

## 2013-12-08 NOTE — Plan of Care (Signed)
Problem: Diagnosis: Increased Risk For Suicide Attempt Goal: LTG-Patient Will Report Improved Mood and Deny Suicidal LTG (by discharge) Patient will report improved mood and deny suicidal ideation.  Outcome: Completed/Met Date Met:  12/08/13 Pt reports she is feeling better and has been able to talk to her husband about her feelings.  She says she is no longer having suicidal thoughts.

## 2013-12-08 NOTE — BHH Suicide Risk Assessment (Signed)
[place] INPATIENT:  Family/Significant Other Suicide Prevention Education  Suicide Prevention Education:  Education Completed; Husband [name] [phone],  (name of family member/significant other) has been identified by the patient as the family member/significant other with whom the patient will be residing, and identified as the person(s) who will aid the patient in the event of a mental health crisis (suicidal ideations/suicide attempt).  With written consent from the patient, the family member/significant other has been provided the following suicide prevention education, prior to the and/or following the discharge of the patient.  The suicide prevention education provided includes the following:  Suicide risk factors  Suicide prevention and interventions  National Suicide Hotline telephone number  [facility] assessment telephone number  [facility] Emergency Assistance [place] and/or Residential Mobile Crisis Unit telephone number  Request made of family/significant other to:  Remove weapons (e.g., guns, rifles, knives), all items previously/currently identified as safety concern.    Remove drugs/medications (over-the-counter, prescriptions, illicit drugs), all items previously/currently identified as a safety concern.  The family member/significant other verbalizes understanding of the suicide prevention education information provided.  The family member/significant other agrees to remove the items of safety concern listed above.  Ola Raap, Casimiro Needle 12/08/2013, 11:42 AM

## 2013-12-08 NOTE — Progress Notes (Signed)
Patient discharged per physician order; patient denies SI/HI and A/V hallucinations; patient received samples, prescriptions, and copy of AVS after it was reviewed; patient received her personal belongings of perfume that was in her medication drawer and it was placed; patient had no other questions or concerns at this time; patient verbalized and signed that she received all belongings from bedside and locker; patient left the unit ambulatory
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
## 2013-12-08 NOTE — Progress Notes (Signed)
[facility] Adult Case Management Discharge Plan :  Will you be returning to the same living situation after discharge: Yes,  patient will return home with her husband and son At discharge, do you have transportation home?:Yes,  patient's husband will provide transportation Do you have the ability to pay for your medications:Yes,  patient will be provided with prescriptions at discharge. She verbalizes her ability to obtain medications.  Release of information consent forms completed and in the chart;  Patient's signature needed at discharge.  Patient to Follow up at: Follow-up Information   Follow up with [facility] On 12/21/2013. (Therapy appointment with Dr. Elita Boone at 11:15 am. Please call office if you need to reschedule.)    Contact information:   [address] [place], [place] [address] Appointments: [phone]       Follow up with [facility] On 01/06/2014. (Please follow up with Crystal Monegue for medication management appointment at 10:45 am. Please call office if you need to reschedule.)    Contact information:   Address: [address] [place], [place] [address] [phone]      Patient denies SI/HI:   Yes,  denies    Safety Planning and Suicide Prevention discussed:  Yes,  with patient and husband  Demorris Choyce, Casimiro Needle 12/08/2013, 11:42 AM

## 2013-12-08 NOTE — Plan of Care (Signed)
Problem: Ineffective individual coping Goal: STG: Patient will remain free from self harm Outcome: Completed/Met Date Met:  12/08/13 Pt has remained free from self harm and is med compliant on the unit.

## 2013-12-08 NOTE — BHH Suicide Risk Assessment (Signed)
Demographic Factors:  48 year old female, married, has a teenaged son.   Total Time spent with patient: 30 minutes  Psychiatric Specialty Exam: Physical Exam  ROS  Blood pressure 121/82, pulse 79, temperature 98.2 F (36.8 C), temperature source Oral, resp. rate 20, height 5\' 4"  (1.626 m), weight 131.543 kg (290 lb), last menstrual period 11/24/2013, SpO2 98.00%.Body mass index is 49.75 kg/(m^2).  General Appearance: improved grooming  Eye Contact::  Good  Speech:  Normal Rate  Volume:  Normal  Mood:  improved mood and affect  Affect:  affect is more reactive, smiles often and appropriately  Thought Process:  Goal Directed and Linear  Orientation:  Full (Time, Place, and Person)  Thought Content:  no hallucinations, no delusions, less ruminative about stressors  Suicidal Thoughts:  No- at this time denies any suicidal ideations or self injurious thoughts .  Homicidal Thoughts:  No  Memory:  recent and remote grossly intact  Judgement:  Other:  improved   Insight:  Fair  Psychomotor Activity:  Normal  Concentration:  Good  Recall:  Good  Fund of Knowledge:Good  Language: Good  Akathisia:  No  Handed:  Right  AIMS (if indicated):     Assets:  Communication Skills Desire for Improvement Resilience  Sleep:  Number of Hours: 5    Musculoskeletal: Strength & Muscle Tone: within normal limits Gait & Station: normal Patient leans: N/A   Mental Status Per Nursing Assessment::   On Admission:  Suicidal ideation indicated by patient  Current Mental Status by Physician: As noted, at this time patient is improved, with improved mood, fuller range of affect, no thought disorder, less ruminative, not suicidal , not homicidal, future oriented  Loss Factors: Son has Asperger's and OCD symptoms, patient's husband working out of state, chronic medical issues ( has history of cardiomyopathy)   Historical Factors: History of Anxiety and Depression related to psychosocial  stressors.   Risk Reduction Factors:   Responsible for children under 29 years of age, Sense of responsibility to family, Living with another person, especially a relative and Positive coping skills or problem solving skills  Continued Clinical Symptoms:  As above, mood and affect currently improved   Cognitive Features That Contribute To Risk:  Fully alert and attentive, oriented x 3- no gross cognitive deficits noted   Suicide Risk:  Mild:  Suicidal ideation of limited frequency, intensity, duration, and specificity.  There are no identifiable plans, no associated intent, mild dysphoria and related symptoms, good self-control (both objective and subjective assessment), few other risk factors, and identifiable protective factors, including available and accessible social support.  Discharge Diagnoses:   AXIS I:  Major Depression, without psychotic symptoms, versus Adjustment Disorder with Depressed Mood . ADHD by history. AXIS II:  Deferred AXIS III:   Past Medical History  Diagnosis Date  . ADHD   . CARDIOMYOPATHY, PERIPARTUM, POSTPARTUM     a. EF previously 30-40%,  b.  resolved, EF 60%    c.  echo 10/2011:  mild LVH, EF 50%, mild LAE,   . MIGRAINE HEADACHE   . OBESITY   . DEPRESSION   . Essential hypertension, benign   . CARPAL TUNNEL SYNDROME, RIGHT   . Anxiety   . Edema     a. LE venous dopplers negative for DVT 10/2011  . CHF (congestive heart failure)   . Chronic systolic CHF (congestive heart failure), NYHA class 3 12/06/2013   AXIS IV:  chronic medical illness, son has Asperger's Syndrome,  husband out of state for work AXIS V:  51-60 moderate symptoms  Plan Of Care/Follow-up recommendations:  Activity:  As tolerated Diet:  Heart Healthy Tests:  NA Other:  See below  Is patient on multiple antipsychotic therapies at discharge:  No   Has Patient had three or more failed trials of antipsychotic monotherapy by history:  No  Recommended Plan for Multiple  Antipsychotic Therapies: NA  Patient is leaving unit in good spirits. Plans to return home- husband currently in town which patient states is very helpful for her. Follow up as below  Follow up with [facility] On 12/21/2013. (Therapy appointment with Dr. Elita Boone at 11:15 am. Please call office if you need to reschedule.)  Contact information:  [address]  [place], [place] [address]  Appointments: [phone]  Follow up with [facility] On 01/06/2014. (Please follow up with Crystal Monegue for medication management appointment at 10:45 am. Please call office if you need to reschedule.)  Contact information:  Address: [address] [place], [place] [address]  [phone]   Also, has established cardiologist for ongoing outpatient medical care as needed. ( Dr. Percival Spanish). States she has appointment within the next two weeks.   COBOS, FERNANDO 12/08/2013, 12:42 PM

## 2013-12-13 NOTE — Progress Notes (Signed)
Patient Discharge Instructions:  After Visit Summary (AVS):   Faxed to:  12/13/13 Discharge Summary Note:   Faxed to:  12/13/13 Psychiatric Admission Assessment Note:   Faxed to:  12/13/13 Suicide Risk Assessment - Discharge Assessment:   Faxed to:  12/13/13 Faxed/Sent to the Next Level Care provider:  12/13/13 Faxed to [place] @ [phone] Faxed to [place] @ [place], 12/13/2013, 1:39 PM

## 2013-12-14 ENCOUNTER — Ambulatory Visit: Payer: Medicare Other | Admitting: Physician Assistant

## 2013-12-29 ENCOUNTER — Encounter: Payer: Self-pay | Admitting: Cardiology

## 2013-12-29 ENCOUNTER — Ambulatory Visit (INDEPENDENT_AMBULATORY_CARE_PROVIDER_SITE_OTHER): Payer: Medicare Other | Admitting: Cardiology

## 2013-12-29 VITALS — BP 134/94 | HR 92 | Ht 64.0 in | Wt 289.0 lb

## 2013-12-29 DIAGNOSIS — I10 Essential (primary) hypertension: Secondary | ICD-10-CM

## 2013-12-29 DIAGNOSIS — O903 Peripartum cardiomyopathy: Secondary | ICD-10-CM

## 2013-12-29 DIAGNOSIS — I5022 Chronic systolic (congestive) heart failure: Secondary | ICD-10-CM

## 2013-12-29 DIAGNOSIS — I5023 Acute on chronic systolic (congestive) heart failure: Secondary | ICD-10-CM

## 2013-12-29 NOTE — Progress Notes (Signed)
HPI The patient presents for evaluation of edema and palpitations and cardiomyopathy.  Since last saw her she was in the hospital with volume overload and some chest discomfort. She was also hospitalized and has been managed for depression.  She thinks her breathing is better. However, she was concerned because she did have left arm discomfort prior to coming to the hospital. She has continued to have some fleeting chest discomfort. She still under a great deal of stress. She's not having the severe swelling that she was having more acute shortness of breath she has her chronic dyspnea. She's not having any new PND or orthopnea. Chest pain is difficult to quantify or qualify her for her. It doesn't seem to be reproducible with activity.  I reviewed the hospital records.    Allergies  Allergen Reactions  . Duloxetine     REACTION: Sucidal thoughts  . Penicillins     REACTION: rash  . Prozac [Fluoxetine Hcl] Other (See Comments)    More depressed    Current Outpatient Prescriptions  Medication Sig Dispense Refill  . amLODipine (NORVASC) 10 MG tablet Take 1 tablet (10 mg total) by mouth daily. 30 tablet 11  . amphetamine-dextroamphetamine (ADDERALL XR) 30 MG 24 hr capsule Take 1 capsule (30 mg total) by mouth daily.  0  . carvedilol (COREG) 12.5 MG tablet Take 1 tablet (12.5 mg total) by mouth 2 (two) times daily with a meal. 60 tablet 11  . carvedilol (COREG) 25 MG tablet Take 25 mg by mouth 2 (two) times daily with a meal.    . clonazePAM (KLONOPIN) 1 MG tablet Take 1 mg by mouth 3 (three) times daily as needed for anxiety.     . enalapril (VASOTEC) 20 MG tablet Take 1 tablet (20 mg total) by mouth 2 (two) times daily. 30 tablet 11  . furosemide (LASIX) 40 MG tablet Take 1 tablet (40 mg total) by mouth daily. 30 tablet 11  . ibuprofen (ADVIL,MOTRIN) 800 MG tablet Take 800 mg by mouth every 8 (eight) hours as needed for headache or mild pain.     [name] lamoTRIgine (LAMICTAL) 200 MG tablet Take  1 tablet (200 mg total) by mouth daily. 30 tablet 0  . potassium chloride SA (K-DUR,KLOR-CON) 20 MEQ tablet Take 1 tablet (20 mEq total) by mouth daily. 30 tablet 11  . sertraline (ZOLOFT) 50 MG tablet Take 1 tablet (50 mg total) by mouth daily. 30 tablet 0  . simvastatin (ZOCOR) 20 MG tablet Take 1 tablet (20 mg total) by mouth daily. 30 tablet 11   No current facility-administered medications for this visit.    Past Medical History  Diagnosis Date  . ADHD   . CARDIOMYOPATHY, PERIPARTUM, POSTPARTUM     a. EF previously 30-40%,  b.  resolved, EF 60%    c.  echo 10/2011:  mild LVH, EF 50%, mild LAE,   . MIGRAINE HEADACHE   . OBESITY   . DEPRESSION   . Essential hypertension, benign   . CARPAL TUNNEL SYNDROME, RIGHT   . Anxiety   . Edema     a. LE venous dopplers negative for DVT 10/2011  . CHF (congestive heart failure)   . Chronic systolic CHF (congestive heart failure), NYHA class 3 12/06/2013    Past Surgical History  Procedure Laterality Date  . Child birth  2003  . Cholecystectomy     ROS:  As stated in the HPI and negative for all other systems.  PHYSICAL EXAM BP 134/94  mmHg  Pulse 92  Ht 5\' 4"  (1.626 m)  Wt 289 lb (131.09 kg)  BMI 49.58 kg/m2 GENERAL:  Well appearing NECK:  No jugular venous distention, waveform within normal limits, carotid upstroke brisk and symmetric, no bruits, no thyromegaly LUNGS:  Clear to auscultation bilaterally BACK:  No CVA tenderness CHEST:  Unremarkable HEART:  PMI not displaced or sustained,S1 and S2 within normal limits, no S3, no S4, no clicks, no rubs, no murmurs ABD:  Flat, positive bowel sounds normal in frequency in pitch, no bruits, no rebound, no guarding, no midline pulsatile mass, no hepatomegaly, no splenomegaly, obese EXT:  2 plus pulses throughout, mild to trace left greater than right lower extremity edema, no cyanosis no clubbing NEURO:  Tearful   ASSESSMENT AND PLAN  EDEMA - She will continue the meds as  listed.  CARDIOMYOPATHY, PERIPARTUM, POSTPARTUM -  Her ejection fraction is 30%. She is at target with her meds. I do think she seems to be euvolemic at this point.  She will remain on the meds as listed.    ESSENTIAL HYPERTENSION, BENIGN -  This is being managed in the context of treating his CHF.  Her BP is slightly elevated but she did just have her Norvasc increased.   ANXIETY DEPRESSION - This has been managed by TALBOT, DAVID C, MD.  She is now on Zoloft.     SNORING/APNEA - Again she is supposed to schedule a sleep study but she has not done this.  I will reorder this.    CHEST PAIN - She needs a stress test.  She would not be able to walk on a treadmill.  She will have a [employer].

## 2013-12-29 NOTE — Patient Instructions (Signed)
Schedule Lexiscan Myoview follow instructions given.  Schedule Sleep Study   Your physician wants you to follow-up in: 6 months. You will receive a reminder letter in the mail two months in advance. If you don't receive a letter, please call our office to schedule the follow-up appointment.

## 2014-01-10 ENCOUNTER — Telehealth: Payer: Self-pay | Admitting: Cardiology

## 2014-01-10 NOTE — Telephone Encounter (Signed)
Spoke with pt, discussed with kristin alvstad pharm md, okay given for patient to take the ansaid.

## 2014-01-10 NOTE — Telephone Encounter (Signed)
Patient in excruciating pain (back).  SHe was prescribed Nabumetone 500 mg.  She wants to make sure it is OK for her to take it.

## 2014-01-11 ENCOUNTER — Ambulatory Visit (INDEPENDENT_AMBULATORY_CARE_PROVIDER_SITE_OTHER): Payer: Medicare Other | Admitting: Psychiatry

## 2014-01-11 ENCOUNTER — Encounter (INDEPENDENT_AMBULATORY_CARE_PROVIDER_SITE_OTHER): Payer: Self-pay

## 2014-01-11 ENCOUNTER — Encounter (HOSPITAL_COMMUNITY): Payer: Self-pay | Admitting: Psychiatry

## 2014-01-11 VITALS — BP 126/74 | HR 93 | Ht 64.0 in | Wt 292.0 lb

## 2014-01-11 DIAGNOSIS — F329 Major depressive disorder, single episode, unspecified: Secondary | ICD-10-CM

## 2014-01-11 DIAGNOSIS — F331 Major depressive disorder, recurrent, moderate: Secondary | ICD-10-CM

## 2014-01-11 DIAGNOSIS — F909 Attention-deficit hyperactivity disorder, unspecified type: Secondary | ICD-10-CM

## 2014-01-11 DIAGNOSIS — F419 Anxiety disorder, unspecified: Secondary | ICD-10-CM

## 2014-01-11 DIAGNOSIS — F988 Other specified behavioral and emotional disorders with onset usually occurring in childhood and adolescence: Secondary | ICD-10-CM

## 2014-01-11 MED ORDER — CLONAZEPAM 1 MG PO TABS: 1.0000 mg | ORAL_TABLET | Freq: Two times a day (BID) | ORAL | Status: DC

## 2014-01-11 MED ORDER — SERTRALINE HCL 50 MG PO TABS: 100.0000 mg | ORAL_TABLET | Freq: Every day | ORAL | Status: DC

## 2014-01-11 MED ORDER — LAMOTRIGINE 200 MG PO TABS: 200.0000 mg | ORAL_TABLET | Freq: Every day | ORAL | Status: DC

## 2014-01-11 MED ORDER — AMPHETAMINE-DEXTROAMPHET ER 30 MG PO CP24: 30.0000 mg | ORAL_CAPSULE | Freq: Every day | ORAL | Status: DC

## 2014-01-11 NOTE — Progress Notes (Signed)
Psychiatric Assessment Adult  Patient Identification:  [name] Date of Evaluation:  01/11/2014 Chief Complaint: Depression, ADHD by history  History of Chief Complaint:   Chief Complaint  Patient presents with  . Depression  . ADD    HPI 48 year old female, known to me from recent psychiatric admission for depression. ( 12/05/13 through the 15th) . At the time she had been feeling overwhelmed and quite depressed in the context of her husband being away in [place] while she took care of their home and her 32 year old son ( who has Asperger's) .  She improved significantly and was discharged back home. She returns today to establish outpatient psychiatric treatment here at [facility]. She had been referred to [facility], but states she prefers to follow up here. States that her PCP renewed her medications as prescribed upon discharge to allow her enough time to see this provider without running out. In general she has been doing well and at this time does not endorse or present with any severe depression. Minimizes neuro- vegetative symptoms of depression and is not anhedonic or sad. Her husband has returned from [facility] and they are now together , which is a contributor to her feeling better and stable at this time. Patient does continue to feel subjectively anxious, and states that she has a long history of having a " touch of OCD", such as having certain rituals, particularly in home cleaning. It does not appear, however, that she has any symptoms of enough severity to warrant a current diagnosis of OCD. For many years she has been on Adderall XR at 30 mgrs a day- denies any misuse or abuse, prescribed for ADHD symptoms. She denies having any side effects at this time. Patient is also on Klonopin, which she has taken for a long time, and which she is agreeing to gradually taper down. She has been on Lamictal for a period of years- she states she is unsure why it was prescribed- she suspects that  the prescribed may have thought she had bipolar disorder, which she is not endorsing a history of , but states " It does not cause side effects and seems to help my mood". Patient has been on Zoloft 50 mgrs QDAY over the last several weeks and has had no side effects thus far. Review of Systems No headaches, No chest pain, no shortness of breath , no orthopnea or DNP at this time,  No coughing , no dysuria, no edema.  Physical Exam- not performed  Depressive Symptoms: anxiety, not depressed at this time  (Hypo) Manic Symptoms:   Elevated Mood:  No Irritable Mood:  No Grandiosity:  No Distractibility:  Yes Labiality of Mood:  No Delusions:  No Hallucinations:  No Impulsivity:  No Sexually Inappropriate Behavior:  No Financial Extravagance:  No Flight of Ideas:  No  Anxiety Symptoms: Excessive Worry:  No Panic Symptoms:  No Agoraphobia:  No Obsessive Compulsive: Yes  Symptoms: states she tends to count and recheck but not of enough severity or duration to warrant a diagnosis of OCD at this time Specific Phobias:  No Social Anxiety:  No  Psychotic Symptoms:  Hallucinations: No None Delusions:  No Paranoia:  No   Ideas of Reference:  No  PTSD Symptoms: Ever had a traumatic exposure:  No Had a traumatic exposure in the last month:  No Re-experiencing: No None Hypervigilance:  No Hyperarousal: No None Avoidance: No None  Traumatic Brain Injury: No   Past Psychiatric History:  Diagnosis: MDD, ADHD  Hospitalizations: One recent psychiatric admission  Here at [facility] in October/15  Outpatient Care: Establishing outpatient treatment here.  Substance Abuse Care: Denies substance abuse other than cannabis use many years ago  Self-Mutilation: Denies   Suicidal Attempts: Denies   Violent Behaviors: Denies    Past Medical History:   As below- and patient has a history of CHF, which first started as a complication of pregnancy. She states that it has been stable for a period of time  and currently has no symptoms. She does have a cardiologist, and in fact has an upcoming routine stress test scheduled. Of note, she states her cardiologist knows about her psychiatric medications, to include her being on Adderall XR for a period of years, and has told her that she can continue taking it. Past Medical History  Diagnosis Date  . ADHD   . CARDIOMYOPATHY, PERIPARTUM, POSTPARTUM     a. EF previously 30-40%,  b.  resolved, EF 60%    c.  echo 10/2011:  mild LVH, EF 50%, mild LAE,   . MIGRAINE HEADACHE   . OBESITY   . DEPRESSION   . Essential hypertension, benign   . CARPAL TUNNEL SYNDROME, RIGHT   . Anxiety   . Edema     a. LE venous dopplers negative for DVT 10/2011  . CHF (congestive heart failure)   . Chronic systolic CHF (congestive heart failure), NYHA class 3 12/06/2013   History of Loss of Consciousness:  No Seizure History:  No Cardiac History:  Yes- CHF as per history Allergies:   Allergies  Allergen Reactions  . Duloxetine     REACTION: Sucidal thoughts  . Penicillins     REACTION: rash  . Prozac [Fluoxetine Hcl] Other (See Comments)    More depressed   Current Medications:  Current Outpatient Prescriptions  Medication Sig Dispense Refill  . amLODipine (NORVASC) 10 MG tablet Take 1 tablet (10 mg total) by mouth daily. 30 tablet 11  . amphetamine-dextroamphetamine (ADDERALL XR) 30 MG 24 hr capsule Take 1 capsule (30 mg total) by mouth daily.  0  . carvedilol (COREG) 12.5 MG tablet Take 1 tablet (12.5 mg total) by mouth 2 (two) times daily with a meal. 60 tablet 11  . carvedilol (COREG) 25 MG tablet Take 25 mg by mouth 2 (two) times daily with a meal.    . clonazePAM (KLONOPIN) 1 MG tablet Take 1 mg by mouth 3 (three) times daily as needed for anxiety.     . enalapril (VASOTEC) 20 MG tablet Take 1 tablet (20 mg total) by mouth 2 (two) times daily. 30 tablet 11  . furosemide (LASIX) 40 MG tablet Take 1 tablet (40 mg total) by mouth daily. 30 tablet 11  .  ibuprofen (ADVIL,MOTRIN) 800 MG tablet Take 800 mg by mouth every 8 (eight) hours as needed for headache or mild pain.     [name] lamoTRIgine (LAMICTAL) 200 MG tablet Take 1 tablet (200 mg total) by mouth daily. 30 tablet 0  . potassium chloride SA (K-DUR,KLOR-CON) 20 MEQ tablet Take 1 tablet (20 mEq total) by mouth daily. 30 tablet 11  . sertraline (ZOLOFT) 50 MG tablet Take 1 tablet (50 mg total) by mouth daily. 30 tablet 0  . simvastatin (ZOCOR) 20 MG tablet Take 1 tablet (20 mg total) by mouth daily. 30 tablet 11   No current facility-administered medications for this visit.    Previous Psychotropic Medications:  Medication Dose  Klonopin 1 mgr TID PRN  Adderall XR 30 mgrs QAM   Lamictal 200 mgrs QDAY    Zoloft 50 mgrs QDAY             Substance Abuse History in the last 12 months: Substance Age of 1st Use Last Use Amount Specific Type  Nicotine    stopped smoking 20 years ago      Alcohol No history      Cannabis No history of drug abuse  Other than cannabis in her 20's     Opiates      Cocaine      Methamphetamines      LSD      Ecstasy      Benzodiazepines      Caffeine      Inhalants      Others:                          Medical Consequences of Substance Abuse:  None   Legal Consequences of Substance Abuse: N/A  Family Consequences of Substance Abuse: N/A   Blackouts:  Denies  DT's:   Denies  Withdrawal Symptoms:  No None  Social History: Current Place of Residence:[place]  Place of Birth:  Family Members:  Marital Status:  Married Children:   Sons: one adolescent son, who has Asperger's ( Mild)   Daughters:  Relationships: husband supportive, currently living with her ( normally works in [employer])  Education:  Dentist Problems/Performance:  Religious Beliefs/Practices:  History of Abuse: none Pensions consultant; 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 Legal History: None  Hobbies/Interests: prefers to stay at home  with family  Family History:   Family History  Problem Relation Age of Onset  . Hypertension Other   . Diabetes Other   . Colitis Other   . Alcohol abuse Other     Mental Status Examination/Evaluation: Objective:  Appearance: Well Groomed  Eye Contact::  Good  Speech:  Normal Rate  Volume:  Normal  Mood:  Euthymic at this time , full range of affect   Affect:  Full Range  Thought Process:  Goal Directed and Linear  Orientation:  Full (Time, Place, and Person)  Thought Content:  no hallucinations, no delusions   Suicidal Thoughts:  No  Homicidal Thoughts:  No  Judgement:  Good  Insight:  Present  Psychomotor Activity:  Normal  Akathisia:  Negative  Handed:  Right  AIMS (if indicated):    Assets:  Communication Skills Desire for Improvement Resilience Social Support    Laboratory/X-Ray Psychological Evaluation(s)       Assessment:  48 year old female with a history of Major Depression and ADHD, S/P psychiatric admission for depression about one and a a half months ago. Currently presents stable , euthymic, and tolerating medications well. She does have some chronic anxiety symptoms, but at this time is functioning well in daily activities.  AXIS I MDD without psychotic features, ADHD   AXIS II Deferred  AXIS III Past Medical History  Diagnosis Date  . ADHD   . CARDIOMYOPATHY, PERIPARTUM, POSTPARTUM     a. EF previously 30-40%,  b.  resolved, EF 60%    c.  echo 10/2011:  mild LVH, EF 50%, mild LAE,   . MIGRAINE HEADACHE   . OBESITY   . DEPRESSION   . Essential hypertension, benign   . CARPAL TUNNEL SYNDROME, RIGHT   . Anxiety   . Edema     a. LE  venous dopplers negative for DVT 10/2011  . CHF (congestive heart failure)   . Chronic systolic CHF (congestive heart failure), NYHA class 3 12/06/2013     AXIS IV Son has Asperger's, car broke down  AXIS V 61-70 mild symptoms   Treatment Plan/Recommendations:  Plan of Care: Will see in one to two months, agrees to  contact me sooner if any worsening prior   Laboratory:  None   Psychotherapy: Has a therapist, Dr. Ivan Croft at [facility]  Medications: Increase ZOLOFT to 100 mgrs QDAY- Continue KLONOPIN at 1 mgr BID, continue ADDERALL XR  30 mgrs QAM, continue LAMICTAL 200 mgrs QDAY   Routine PRN Medications:  No  Consultations: No   Safety Concerns:  Patient aware of potential for adderall to increase BP and pulse- states she has been on it for many years without incident and that her cardiologist is in agreement with her to take it.  Agrees to contact clinic if any worsening prior to next appt in 5-6 weeks  Other:  Scheduled  By her Cardiolotgist for  A stress test in 2 weeks ( routine)     Neita Garnet, MD 11/17/20152:04 PM

## 2014-01-17 ENCOUNTER — Other Ambulatory Visit: Payer: Self-pay

## 2014-01-17 DIAGNOSIS — Z1231 Encounter for screening mammogram for malignant neoplasm of breast: Secondary | ICD-10-CM

## 2014-01-18 ENCOUNTER — Encounter (HOSPITAL_COMMUNITY): Payer: Self-pay

## 2014-01-18 ENCOUNTER — Telehealth (HOSPITAL_COMMUNITY): Payer: Self-pay

## 2014-01-18 NOTE — Telephone Encounter (Signed)
Encounter complete. 

## 2014-01-19 ENCOUNTER — Encounter (HOSPITAL_COMMUNITY): Payer: Self-pay

## 2014-01-21 ENCOUNTER — Telehealth: Payer: Self-pay | Admitting: Cardiology

## 2014-01-21 NOTE — Telephone Encounter (Signed)
Called back and got answering machine.

## 2014-01-21 NOTE — Telephone Encounter (Signed)
Returned call to patient no answer.LMTC. 

## 2014-01-21 NOTE — Telephone Encounter (Signed)
New Msg   Patient husband returning call. Please contact at [phone].

## 2014-01-24 ENCOUNTER — Telehealth: Payer: Self-pay | Admitting: Cardiology

## 2014-01-24 NOTE — Telephone Encounter (Signed)
Pt. Coming in tomorrow for stress test

## 2014-01-24 NOTE — Telephone Encounter (Signed)
Patient informed. 

## 2014-01-24 NOTE — Telephone Encounter (Signed)
Pt called in stating that she has been having some severe back pain and is on numerous pain meds. She would like to know if she should proceed with having these procedures this week. Please call  Thanks

## 2014-01-25 ENCOUNTER — Ambulatory Visit (HOSPITAL_COMMUNITY)
Admission: RE | Admit: 2014-01-25 | Discharge: 2014-01-25 | Disposition: A | Discharge status: Home / Self Care | Payer: Medicare Other | Source: Ambulatory Visit | Attending: Cardiovascular Disease | Admitting: Cardiovascular Disease

## 2014-01-25 DIAGNOSIS — Z79899 Other long term (current) drug therapy: Secondary | ICD-10-CM | POA: Diagnosis not present

## 2014-01-25 DIAGNOSIS — I5022 Chronic systolic (congestive) heart failure: Secondary | ICD-10-CM | POA: Diagnosis present

## 2014-01-25 DIAGNOSIS — I10 Essential (primary) hypertension: Secondary | ICD-10-CM | POA: Insufficient documentation

## 2014-01-25 DIAGNOSIS — O903 Peripartum cardiomyopathy: Secondary | ICD-10-CM | POA: Insufficient documentation

## 2014-01-25 DIAGNOSIS — I5023 Acute on chronic systolic (congestive) heart failure: Secondary | ICD-10-CM | POA: Diagnosis not present

## 2014-01-25 MED ORDER — AMINOPHYLLINE 25 MG/ML IV SOLN
200.0000 mg | Freq: Once | INTRAVENOUS | Status: AC
  Administered 2014-01-25: 200 mg via INTRAVENOUS

## 2014-01-25 MED ORDER — REGADENOSON 0.4 MG/5ML IV SOLN
0.4000 mg | Freq: Once | INTRAVENOUS | Status: AC
  Administered 2014-01-25: 0.4 mg via INTRAVENOUS

## 2014-01-25 MED ORDER — TECHNETIUM TC 99M SESTAMIBI GENERIC - CARDIOLITE
31.3000 | Freq: Once | INTRAVENOUS | Status: AC | PRN
  Administered 2014-01-25: 31.3 via INTRAVENOUS

## 2014-01-25 NOTE — Procedures (Addendum)
[place] [address] [place] [address] [phone]  Cardiology Nuclear Med Study  [name] is a 48 y.o. female     MRN : [id number]     DOB: [birth year]  Procedure Date: 01/25/2014  Nuclear Med Background Indication for Stress Test:  Evaluation for Ischemia History:  Cardiomyopathy, Chronic CHF, no prior MPI Cardiac Risk Factors: History of Smoking, Hypertension and Lipids  Symptoms:  Chest Pain, DOE, Palpitations and SOB   Nuclear Pre-Procedure Caffeine/Decaff Intake:  9:00pm NPO After: 7:00am   IV Site: L Forearm  IV 0.9% NS with Angio Cath:  22g  Chest Size (in):  n/a IV Started by: Otho Perl, CNMT  Height: 5\' 4"  (1.626 m)  Cup Size: DDD  BMI:  Body mass index is 49.58 kg/(m^2). Weight:  289 lb (131.09 kg)   Tech Comments:  n/a    Nuclear Med Study 1 or 2 day study: 2 day  Stress Test Type:  [place] Provider:  Minus Breeding, MD   Resting Radionuclide: Technetium 61m Sestamibi  Resting Radionuclide Dose: 31.3 mCi   Stress Radionuclide:  Technetium 75m Sestamibi  Stress Radionuclide Dose: 31.5 mCi           Stress Protocol Rest HR: 74 Stress HR: 81  Rest BP: 100/70 Stress BP: 86/47  Exercise Time (min): n/a METS: n/a   Predicted Max HR: 172 bpm % Max HR: 48.26 bpm Rate Pressure Product: 8300  Dose of Adenosine (mg):  n/a Dose of Lexiscan: 0.4 mg  Dose of Atropine (mg): n/a Dose of Dobutamine: n/a mcg/kg/min (at max HR)  Stress Test Technologist: Leane Para, CCT Nuclear Technologist: Otho Perl, CNMT   Rest Procedure:  Myocardial perfusion imaging was performed at rest 45 minutes following the intravenous administration of Technetium 106m Sestamibi. Stress Procedure:  The patient received IV Lexiscan 0.4 mg over 15-seconds.  Technetium 11m Sestamibi injected IV at 30-seconds.  Patient experienced mild SOB and Hypotension; without symptoms and 200 mg Aminophylline IV  was administered.  There were no significant changes with Lexiscan.  Quantitative spect images were obtained after a 45 minute delay.  Transient Ischemic Dilatation (Normal <1.22):  0.97 QGS EDV:  196 ml QGS ESV:  125 ml LV Ejection Fraction: 37%       Rest ECG: NSR - Normal EKG  Stress ECG: No significant change from baseline ECG  QPS Raw Data Images:  Normal; no motion artifact; normal heart/lung ratio. Stress Images:  Normal homogeneous uptake in all areas of the myocardium. Rest Images:  Normal homogeneous uptake in all areas of the myocardium. Subtraction (SDS):  No evidence of ischemia.  Impression Exercise Capacity:  Lexiscan with no exercise. BP Response:  Hypotensive blood pressure response. Clinical Symptoms:  No significant symptoms noted. ECG Impression:  No significant ST segment change suggestive of ischemia. Comparison with Prior Nuclear Study: No images to compare  Overall Impression:  Low risk stress nuclear study LV dilatation, apical thinning.  LV Wall Motion:  Moderate global LV dysfunction c/w 2D echo   Lorretta Harp, MD  01/26/2014 11:56 AM

## 2014-01-26 ENCOUNTER — Ambulatory Visit (HOSPITAL_COMMUNITY)
Admission: RE | Admit: 2014-01-26 | Discharge: 2014-01-26 | Disposition: A | Discharge status: Home / Self Care | Payer: Medicare Other | Source: Ambulatory Visit | Attending: Cardiovascular Disease | Admitting: Cardiovascular Disease

## 2014-01-26 DIAGNOSIS — I10 Essential (primary) hypertension: Secondary | ICD-10-CM | POA: Diagnosis not present

## 2014-01-26 DIAGNOSIS — I509 Heart failure, unspecified: Secondary | ICD-10-CM | POA: Diagnosis not present

## 2014-01-26 DIAGNOSIS — I5023 Acute on chronic systolic (congestive) heart failure: Secondary | ICD-10-CM

## 2014-01-26 DIAGNOSIS — F17210 Nicotine dependence, cigarettes, uncomplicated: Secondary | ICD-10-CM | POA: Insufficient documentation

## 2014-01-26 DIAGNOSIS — I429 Cardiomyopathy, unspecified: Secondary | ICD-10-CM | POA: Diagnosis not present

## 2014-01-26 DIAGNOSIS — Z136 Encounter for screening for cardiovascular disorders: Secondary | ICD-10-CM | POA: Diagnosis present

## 2014-01-26 MED ORDER — TECHNETIUM TC 99M SESTAMIBI GENERIC - CARDIOLITE
30.0000 | Freq: Once | INTRAVENOUS | Status: AC | PRN
  Administered 2014-01-26: 30 via INTRAVENOUS

## 2014-01-26 MED ORDER — TECHNETIUM TC 99M SESTAMIBI GENERIC - CARDIOLITE: 30.0000 | Freq: Once | INTRAVENOUS | Status: AC | PRN

## 2014-02-08 ENCOUNTER — Ambulatory Visit (INDEPENDENT_AMBULATORY_CARE_PROVIDER_SITE_OTHER): Payer: Medicare Other | Admitting: Psychiatry

## 2014-02-08 ENCOUNTER — Encounter (HOSPITAL_COMMUNITY): Payer: Self-pay | Admitting: Psychiatry

## 2014-02-08 VITALS — BP 126/78 | HR 88 | Wt 289.0 lb

## 2014-02-08 DIAGNOSIS — F419 Anxiety disorder, unspecified: Secondary | ICD-10-CM

## 2014-02-08 DIAGNOSIS — F909 Attention-deficit hyperactivity disorder, unspecified type: Secondary | ICD-10-CM

## 2014-02-08 DIAGNOSIS — F329 Major depressive disorder, single episode, unspecified: Secondary | ICD-10-CM

## 2014-02-08 DIAGNOSIS — F331 Major depressive disorder, recurrent, moderate: Secondary | ICD-10-CM

## 2014-02-08 MED ORDER — CLONAZEPAM 1 MG PO TABS: 1.0000 mg | ORAL_TABLET | Freq: Two times a day (BID) | ORAL | Status: DC

## 2014-02-08 MED ORDER — LAMOTRIGINE 200 MG PO TABS: 200.0000 mg | ORAL_TABLET | Freq: Every day | ORAL | Status: DC

## 2014-02-08 MED ORDER — SERTRALINE HCL 100 MG PO TABS: 100.0000 mg | ORAL_TABLET | Freq: Every day | ORAL | Status: DC

## 2014-02-08 MED ORDER — SERTRALINE HCL 50 MG PO TABS: 100.0000 mg | ORAL_TABLET | Freq: Every day | ORAL | Status: DC

## 2014-02-08 NOTE — Progress Notes (Signed)
   [facility] Follow-up Outpatient Visit- Medication Management Note- 25 minutes   [name] 05/02/1965  Date: 02/08/14    Subjective:  Patient returns for medication management and support. States that overall she has been doing well, but has been facing some increased stressors. She recently has developed sciatica and needed a course of steroids and a local injection. She is feeling a " little better" but continues to have some pain. Her brother in Sports coach, who resides in [place], has had an MI and has needed a toe surgery due to DM. Her husband has travelled to [place] to visit  Brother, and plans to return next week to pick up patient and go back to [place] to visit other family members for the holiday. Patient reports anxiety related to the above stressors , but states that overall she is doing well and states that the Zoloft " does seem to be working". Objective : At this time patient presents with improved mood and affect. Some ongoing anxiety related to stressors as above. Denies medication side effects. Feels medications are helping. We reviewed doses and potential side effects. Of note, she denies any misuse or abuse of Klonopin. We discussed stressors as above and coping skills she uses to address stressors. Of note, patient sees an outpatient therapist in addition to medication management. Regarding her medical illnesses ( History of CHF) , states she has been stable, and denies worsening symptoms, orthopnea or DPN at this time. Sleep, appetite, energy level are all unchanged . Denies anhedonia. NO suicidal ideations.  Filed Vitals:   02/08/14 1314  BP: 126/78  Pulse: 88    Mental Status Examination  Appearance: Well groomed Alert: Yes Attention: good  Cooperative: Yes Eye Contact: Good Speech: Normal in tone , volume and rate  Psychomotor Activity: Normal Memory/Concentration: recent and remote grossly intact  Oriented: 0x3 Mood: Euthymic Affect: Appropriate and  Congruent Thought Processes and Associations: Goal Directed and Linear Fund of Knowledge: Good Thought Content: No hallucinations, no delusions, no suicidal ideations, no homicidal ideations, future oriented, for example thinking of what Christmas gifts to buy for family members pending her upcoming visit to [place]. Insight: Good Judgement: Good  Diagnosis: MDD/ADHD/Anxiety Disorder NOS  ASSESSMENT: Currently stable, and presents euthymic, in spite of stressors relating to brother in law being physically ill, upcoming trip to [place] for holidays. Tolerating medications well and states Zoloft 100 mgrsd QDAY is effective and well tolerated   Treatment Plan:  RENEW MEDICATIONS- patient is requesting 90 day supply to make her copay more affordable.  Renew ZOLOFT 100 mgrs QDAY, LAMICTAL 200 mgrsd QDAY  ( 90 day supply), and renew KLONOPIN 1 mgr BID ( 30 day supply with two refills)- of note,  Written paper script given for Klonopin as computer/printer did not print out Klonopin script. ( Adderall is prescribed by her PCP at this time)  Patient to return in 5- 6  weeks and agrees to contact me sooner if any worsening or concerns prior .     Neita Garnet, MD

## 2014-02-09 ENCOUNTER — Ambulatory Visit: Payer: Self-pay

## 2014-03-22 ENCOUNTER — Ambulatory Visit (HOSPITAL_COMMUNITY): Payer: Self-pay | Admitting: Psychiatry

## 2014-03-29 ENCOUNTER — Ambulatory Visit (INDEPENDENT_AMBULATORY_CARE_PROVIDER_SITE_OTHER): Payer: Medicare Other | Admitting: Psychiatry

## 2014-03-29 DIAGNOSIS — F909 Attention-deficit hyperactivity disorder, unspecified type: Secondary | ICD-10-CM

## 2014-03-29 DIAGNOSIS — F329 Major depressive disorder, single episode, unspecified: Secondary | ICD-10-CM

## 2014-03-29 DIAGNOSIS — F331 Major depressive disorder, recurrent, moderate: Secondary | ICD-10-CM

## 2014-03-29 DIAGNOSIS — F419 Anxiety disorder, unspecified: Secondary | ICD-10-CM

## 2014-03-29 MED ORDER — CLONAZEPAM 1 MG PO TABS: 1.0000 mg | ORAL_TABLET | Freq: Three times a day (TID) | ORAL | Status: DC | PRN

## 2014-03-29 MED ORDER — LAMOTRIGINE 200 MG PO TABS: 200.0000 mg | ORAL_TABLET | Freq: Every day | ORAL | Status: DC

## 2014-03-29 MED ORDER — SERTRALINE HCL 100 MG PO TABS: 150.0000 mg | ORAL_TABLET | Freq: Every day | ORAL | Status: DC

## 2014-03-29 NOTE — Progress Notes (Signed)
[facility] [phone] Progress Note  [name] [id number] 49 y.o.  03/29/2014 6:47 PM  I met with patient for 30 minutes  Subjective-  Patient states she has not been doing well recently. She has felt depressed, often overwhelmed. She describes severe psychosocial stressors. She reports her husband, who is originally from [place], has apparently left for [place], without telling her. She states she suspects he has another woman there. She states she has though of divorcing him, but loves him, and is also dependent on him financially.  Reports that this has happened in the past and that when he leaves overseas, he usually return in 2 months or so. She reports that without his support it is more difficult to pay bills, take care of household, etc.  Objective: Patient reports significant psychosocial stressors as above. Although depressed, she denies any suicidal ideations, and states she has her son to care for and because of him she would never consider hurting self. States that although often feeling tired and overwhelmed, she has generally been able to take care of basic activities of daily life such as cooking, cleaning house, taking son to school, etc. She states sleep is variable, appetite is "OK", energy level tends to be low. She denies any SI and denies any psychotic symptoms. She states she has been struggling with increased anxiety, at times feels " panicky".  She denies medication side effects, and feels medications are helping albeit partially. She had contacted me via phone regarding these stressors recently and I had recommended she consider IOP level of care- patient states " I can't do this because I can't afford it". She does state she would agree to see a therapist in addition to seeing me for medication management. She responds well to support, encouragement, empathy.    History of Present Illness: Suicidal Ideation: No Plan Formed: No Patient has means to carry  out plan: No  Homicidal Ideation: No Plan Formed: No Patient has means to carry out plan: No  Review of Systems: Psychiatric: Agitation: No Hallucination: No Depressed Mood: Yes Insomnia: Yes ( intermittent)  Hypersomnia: No Altered Concentration: No Feels Worthless: decreased sense of self esteem Grandiose Ideas: Negative Belief In Special Powers: No New/Increased Substance Abuse: No Compulsions: No  Neurologic: Headache: No Seizure: No Paresthesias: No  Past Medical Family, Social History: as above, patient currently facing significant stressors.  Outpatient Encounter Prescriptions as of 03/29/2014  Medication Sig  . amLODipine (NORVASC) 10 MG tablet Take 1 tablet (10 mg total) by mouth daily.  [name] amphetamine-dextroamphetamine (ADDERALL XR) 30 MG 24 hr capsule Take 1 capsule (30 mg total) by mouth daily.  . carvedilol (COREG) 12.5 MG tablet Take 1 tablet (12.5 mg total) by mouth 2 (two) times daily with a meal.  . carvedilol (COREG) 25 MG tablet Take 25 mg by mouth 2 (two) times daily with a meal.  . clonazePAM (KLONOPIN) 1 MG tablet Take 1 tablet (1 mg total) by mouth 3 (three) times daily as needed for anxiety.  . enalapril (VASOTEC) 20 MG tablet Take 1 tablet (20 mg total) by mouth 2 (two) times daily.  . furosemide (LASIX) 40 MG tablet Take 1 tablet (40 mg total) by mouth daily.  [name] ibuprofen (ADVIL,MOTRIN) 800 MG tablet Take 800 mg by mouth every 8 (eight) hours as needed for headache or mild pain.   [name] lamoTRIgine (LAMICTAL) 200 MG tablet Take 1 tablet (200 mg total) by mouth daily.  . potassium chloride SA (K-DUR,KLOR-CON) 20 MEQ tablet  Take 1 tablet (20 mEq total) by mouth daily.  . sertraline (ZOLOFT) 100 MG tablet Take 1.5 tablets (150 mg total) by mouth daily.  . simvastatin (ZOCOR) 20 MG tablet Take 1 tablet (20 mg total) by mouth daily.  . [DISCONTINUED] clonazePAM (KLONOPIN) 1 MG tablet Take 1 tablet (1 mg total) by mouth 2 (two) times daily.  . [DISCONTINUED]  clonazePAM (KLONOPIN) 1 MG tablet Take 1 tablet (1 mg total) by mouth 3 (three) times daily as needed for anxiety.  . [DISCONTINUED] lamoTRIgine (LAMICTAL) 200 MG tablet Take 1 tablet (200 mg total) by mouth daily.  . [DISCONTINUED] sertraline (ZOLOFT) 100 MG tablet Take 1 tablet (100 mg total) by mouth daily.    Past Psychiatric History/Hospitalization(s): Anxiety: Yes Bipolar Disorder: No Depression: Yes Mania: No Psychosis: No Schizophrenia: No Personality Disorder: No Hospitalization for psychiatric illness: Yes- for depression History of Electroconvulsive Shock Therapy: No Prior Suicide Attempts: No  Physical Exam: Constitutional:  There were no vitals taken for this visit.  General Appearance: well groomed , fully alert, attentive, oriented x 3   Musculoskeletal: Strength & Muscle Tone: within normal limits Gait & Station: normal Patient leans: N/A  Psychiatric: Speech (describe rate, volume, coherence, spontaneity, and abnormalities if any):  Normal   Thought Process (describe rate, content, abstract reasoning, and computation): linear   Associations: Coherent and Relevant  Thoughts:  No suicidal ideations, no homicidal ideations, no hallucinations, no delusions  Mental Status: Orientation:alert , attentive, oriented x 3  Mood & Affect: depressed affect and sad , tearful when discussing psychosocial stressors Attention Span & Concentration: within normal limits   Medical Decision Making (Choose Three): Review of Psycho-Social Stressors (1), Established Problem, Worsening (2) and Review of Medication Regimen & Side Effects (2)  Assessment: Patient more depressed and anxious in the context of significant marital, psychosocial stressors. She is not suicidal or psychotic. She identifies her son as a protective factor against considering suicide and remains future oriented. She is tolerating medications well.   Dx- Major Depression without psychotic symptoms, ADHD by  history.    Plan:  1. Patient referred to individual psychotherapy, hopefully every week for now. 2. Will see again in two weeks- agrees to contact clinic sooner if worsening prior and agrees to go to ED if any SI should emerge. 3. Increase Klonopin to 1 mgr BID + 1 mgr PRN for severe anxiety ( max dose 3 mgrs/ 24 hours)  4. Increase Zoloft to 150 mgrs QDAY  5. Lamictal 200 mgrs QDAY ( Of note, patient has been on stimulant medication for years ( Adderall) for ADHD- denies side effects. I have reviewed side effect profile with her, and potential for abuse and anxiogenic effect of stimulants. Patient denies having any side effects and states she cannot function well without it. It is prescribed by PCP.)    Neita Garnet, MD 03/29/2014

## 2014-04-12 ENCOUNTER — Ambulatory Visit (INDEPENDENT_AMBULATORY_CARE_PROVIDER_SITE_OTHER): Payer: Medicare Other | Admitting: Psychiatry

## 2014-04-12 VITALS — BP 146/94 | HR 103 | Ht 64.0 in | Wt 288.4 lb

## 2014-04-12 DIAGNOSIS — F339 Major depressive disorder, recurrent, unspecified: Secondary | ICD-10-CM

## 2014-04-12 DIAGNOSIS — F331 Major depressive disorder, recurrent, moderate: Secondary | ICD-10-CM

## 2014-04-12 DIAGNOSIS — F419 Anxiety disorder, unspecified: Secondary | ICD-10-CM

## 2014-04-12 MED ORDER — SERTRALINE HCL 100 MG PO TABS: 100.0000 mg | ORAL_TABLET | Freq: Two times a day (BID) | ORAL | Status: DC

## 2014-04-12 MED ORDER — CLONAZEPAM 1 MG PO TABS: 1.0000 mg | ORAL_TABLET | Freq: Three times a day (TID) | ORAL | Status: DC | PRN

## 2014-04-12 MED ORDER — LAMOTRIGINE 200 MG PO TABS: 200.0000 mg | ORAL_TABLET | Freq: Every day | ORAL | Status: DC

## 2014-04-12 NOTE — Progress Notes (Signed)
[facility] [phone] Progress Note  [name] [id number] 49 y.o.  04/12/2014 1:43 PM  Duration- 30 minutes  Subjective- patient states it has been difficult for her recently due to her husband being away, out of the [place], and states she has been ruminating about terminating the relationship, divorcing, but states she does not want to do this because she  Loves him still and because she is financially dependent upon him.  Objective - patient presents partially improved compared to her most recent visit. Her grooming is improved, her range of affect is better, and she smiled and even laughed during session appropriately . She has been able to function better in daily activities, and spoke about being able to contact the Heating and Air repairman after her heater broke, and then contact them again when it was not fixed properly. She was also able to organize a sleep over for her 14 year old son, and to arrange for  His birthday party. She has been texting back and forth with her husband , who is currently in [place]. She states husband has been Engineering geologist and said that he will be back " soon". She states, however, that she has lost trust in him. She does not endorse any major neuro-vegetative symptoms of depression. She does have chronic SOB with exertion related to her cardiac illness, and has some chronic insomnia. We have reviewed medication side effects- she is tolerating these well. She denies any misuse or abuse of BZD, and states she normally takes  Two tablets a day, and only takes a third one occasionally when  She feels she is going into a panic attack. No side effects on Zoloft, and there has been good, but partial response to it. I have again encouraged her to consider tapering down her Stimulant medication, and expressed concern about how this medication might have negative cardiac side effects such as tachycardia , HTN  ( patient has a history of CHF). Of note, this  medication is prescribed by her PCP. Patient states she  Been on this medication for years, has no side effects, and  that her both  PCP and Cardiologist know she is on it . Patient responds well to support, encouragement, empathy, review of coping skills .  History of Present Illness: Suicidal Ideation: No Plan Formed: No Patient has means to carry out plan: No  Homicidal Ideation: No Plan Formed: No Patient has means to carry out plan: No  Review of Systems: No headaches, no chest pain, shortness of breath upon exertion, frequent diarrhea/loose stools, no worsening peripheral edema Psychiatric: Agitation: No Hallucination: No Depressed Mood: Yes- but improved compared to prior  Insomnia: Yes Hypersomnia: No Altered Concentration: No Feels Worthless: No Grandiose Ideas: No Belief In Special Powers: No New/Increased Substance Abuse: No Compulsions: No  Neurologic: Headache: Negative Seizure: Negative Paresthesias: No    Outpatient Encounter Prescriptions as of 04/12/2014  Medication Sig  . amLODipine (NORVASC) 10 MG tablet Take 1 tablet (10 mg total) by mouth daily.  [name] amphetamine-dextroamphetamine (ADDERALL XR) 30 MG 24 hr capsule Take 1 capsule (30 mg total) by mouth daily.  . carvedilol (COREG) 12.5 MG tablet Take 1 tablet (12.5 mg total) by mouth 2 (two) times daily with a meal.  . carvedilol (COREG) 25 MG tablet Take 25 mg by mouth 2 (two) times daily with a meal.  . clonazePAM (KLONOPIN) 1 MG tablet Take 1 tablet (1 mg total) by mouth 3 (three) times daily as needed for anxiety.  [name]  enalapril (VASOTEC) 20 MG tablet Take 1 tablet (20 mg total) by mouth 2 (two) times daily.  . furosemide (LASIX) 40 MG tablet Take 1 tablet (40 mg total) by mouth daily.  [name] ibuprofen (ADVIL,MOTRIN) 800 MG tablet Take 800 mg by mouth every 8 (eight) hours as needed for headache or mild pain.   [name] lamoTRIgine (LAMICTAL) 200 MG tablet Take 1 tablet (200 mg total) by mouth daily.  . potassium  chloride SA (K-DUR,KLOR-CON) 20 MEQ tablet Take 1 tablet (20 mEq total) by mouth daily.  . sertraline (ZOLOFT) 100 MG tablet Take 1.5 tablets (150 mg total) by mouth daily.  . simvastatin (ZOCOR) 20 MG tablet Take 1 tablet (20 mg total) by mouth daily.    Past Psychiatric History/Hospitalization(s): Anxiety: Yes Bipolar Disorder: No Depression: Yes Mania: No Psychosis: No Schizophrenia: No Personality Disorder: No Hospitalization for psychiatric illness: Yes- for depression History of Electroconvulsive Shock Therapy: No Prior Suicide Attempts: No  Physical Exam: Constitutional:  BP 146/94 mmHg  Pulse 103  Ht 5\' 4"  (1.626 m)  Wt 288 lb 6.4 oz (130.817 kg)  BMI 49.48 kg/m2  General Appearance: alert, oriented, no acute distress and obese  Musculoskeletal: Strength & Muscle Tone: within normal limits Gait & Station: normal Patient leans: N/A  Psychiatric: Speech (describe rate, volume, coherence, spontaneity, and abnormalities if any): normal   Thought Process (describe rate, content, abstract reasoning, and computation): linear , well organized   Associations: Coherent  Thoughts: normal and no hallucinations, no delusions, no suicidal ideations, denies any homicidal ideations  Mental Status: Orientation: fully alert and attentive and oriented x 3  Mood & Affect: depressed affect, but improved mood compared to prior presentation and affect more reactive  Attention Span & Concentration: normal   Medical Decision Making (Choose Three): Established Problem, Stable/Improving (1), Review of Psycho-Social Stressors (1), Review of Last Therapy Session (1) and Review of Medication Regimen & Side Effects (2)  Assessment: Patient  Has chronic depression, recently worsened due to marital stress, husband leaving for [place] to visit his relatives, and limited local support. However, she is functioning better, and able to address daily stressors and functioning better than before.  Denies medication side effects and is responding well to Zoloft thus far .   Dx- MDD  Recurrent, without psychotic features    Plan: Will see in four weeks , patient agrees to contact me sooner if any worsening prior - patient also seeing psychotherapist for individual therapy tomorrow for the first time. Renew Klonopin 1 mgr BID + 1 mgr PRN if severe anxiety Increase Zoloft to 100 mgrs BID Renew Lamictal 200 mgrs Hubert Azure, MD 04/12/2014

## 2014-04-13 ENCOUNTER — Ambulatory Visit (INDEPENDENT_AMBULATORY_CARE_PROVIDER_SITE_OTHER): Payer: Medicare Other | Admitting: Clinical

## 2014-04-13 ENCOUNTER — Encounter (HOSPITAL_COMMUNITY): Payer: Self-pay | Admitting: Clinical

## 2014-04-13 ENCOUNTER — Telehealth (HOSPITAL_COMMUNITY): Payer: Self-pay

## 2014-04-13 DIAGNOSIS — F411 Generalized anxiety disorder: Secondary | ICD-10-CM | POA: Diagnosis not present

## 2014-04-13 DIAGNOSIS — F331 Major depressive disorder, recurrent, moderate: Secondary | ICD-10-CM | POA: Diagnosis not present
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
## 2014-04-20 ENCOUNTER — Ambulatory Visit (INDEPENDENT_AMBULATORY_CARE_PROVIDER_SITE_OTHER): Payer: Medicare Other | Admitting: Clinical

## 2014-04-20 ENCOUNTER — Telehealth (HOSPITAL_COMMUNITY): Payer: Self-pay | Admitting: *Deleted

## 2014-04-20 ENCOUNTER — Telehealth: Payer: Self-pay | Admitting: Cardiology

## 2014-04-20 DIAGNOSIS — F411 Generalized anxiety disorder: Secondary | ICD-10-CM | POA: Diagnosis not present

## 2014-04-20 DIAGNOSIS — F331 Major depressive disorder, recurrent, moderate: Secondary | ICD-10-CM | POA: Diagnosis not present

## 2014-04-20 MED ORDER — SERTRALINE HCL 100 MG PO TABS: 100.0000 mg | ORAL_TABLET | Freq: Two times a day (BID) | ORAL | Status: DC

## 2014-04-20 NOTE — Telephone Encounter (Signed)
Spoke with patient via phone- doing better, no SI. Tolerating medications well. Due to concern about copay, is asking for Zoloft to be called in x 30 days rather than 15 days. Has appt. To see me in about three weeks.  Will contact pharmacy to approve as above.

## 2014-04-20 NOTE — Telephone Encounter (Signed)
RE: sleep aid  Pt called PCP first. Pt was advised to contact Dr. Percival Spanish in advance of her sleep study to see if a sleep aid would be recommended. She is requesting something to help her sleep for that night while test is conducted.  I let patient know I would route request to Dr. Percival Spanish, he could determine appropriate use.

## 2014-04-20 NOTE — Telephone Encounter (Signed)
Met with Dr. Parke Poisson today who requested this nurse send in a new order for patient's Zoloft 15m, 2 BID with 1 refill, #60 as he was now approving a 30 day supply of medication after earlier conversation with patient this week.  E-scribed in new order to [id number]   Called patient to inform the new order was e-scribed in to her pharmacy.  [facility] inform of new 30 day supply order e-scribed over and to make sure they did not continue to fill from order 04/12/14.  Pharmacist, MRonalee Beltsconfirmed new order was there and canceled out refill from previous order as will now fill prescription from new order.

## 2014-04-20 NOTE — Telephone Encounter (Signed)
[name] is calling to see if Dr. Percival Spanish would send her some sleep aid medication to her pharmacy to hel p her sleep . FYI: having some problems with her phone freezing , if she does not answer please call back .  Thanks

## 2014-04-21 MED ORDER — ZOLPIDEM TARTRATE 5 MG PO TABS: 5.0000 mg | ORAL_TABLET | Freq: Once | ORAL | Status: DC

## 2014-04-21 NOTE — Telephone Encounter (Signed)
Received call back from sleep clinic. They are not authorized to give meds. Was advised to call in Rx for patient.

## 2014-04-21 NOTE — Telephone Encounter (Signed)
I checked with one of the sleep MDs.  It is OK for her to have a one time dose of 5 mg Ambien for the sleep study.  Please check with the people administering the study.  Can they give that to her at the time of the study?

## 2014-04-21 NOTE — Telephone Encounter (Signed)
Pt called. Advised that I had attempted to get in touch w/ sleep clinic and am waiting on callback.  Pt preferred pharmacy is [facility]..will call in rx if I don't hear from Sleep clinic this afternoon. She understands that due to med effects she would have to wait until she arrived at her destination before taking the medication.

## 2014-04-21 NOTE — Telephone Encounter (Signed)
Left message w/ sleep disorders center [phone]

## 2014-04-21 NOTE — Telephone Encounter (Signed)
Called in Rx to pt's preferred pharmacy. Called pt to notify. She expressed thanks and understanding of instructions given.

## 2014-04-22 ENCOUNTER — Encounter (HOSPITAL_COMMUNITY): Payer: Self-pay | Admitting: Clinical

## 2014-04-22 ENCOUNTER — Ambulatory Visit (HOSPITAL_BASED_OUTPATIENT_CLINIC_OR_DEPARTMENT_OTHER): Payer: Medicare Other | Attending: Cardiology

## 2014-04-22 VITALS — Ht 64.0 in | Wt 289.0 lb

## 2014-04-22 DIAGNOSIS — I10 Essential (primary) hypertension: Secondary | ICD-10-CM | POA: Diagnosis not present

## 2014-04-22 DIAGNOSIS — I5022 Chronic systolic (congestive) heart failure: Secondary | ICD-10-CM

## 2014-04-22 DIAGNOSIS — Z6841 Body Mass Index (BMI) 40.0 and over, adult: Secondary | ICD-10-CM | POA: Insufficient documentation

## 2014-04-22 DIAGNOSIS — G4733 Obstructive sleep apnea (adult) (pediatric): Secondary | ICD-10-CM

## 2014-04-22 DIAGNOSIS — Z9989 Dependence on other enabling machines and devices: Secondary | ICD-10-CM

## 2014-04-22 DIAGNOSIS — O903 Peripartum cardiomyopathy: Secondary | ICD-10-CM | POA: Insufficient documentation

## 2014-04-22 DIAGNOSIS — I5023 Acute on chronic systolic (congestive) heart failure: Secondary | ICD-10-CM | POA: Diagnosis not present

## 2014-04-22 NOTE — Progress Notes (Signed)
   THERAPIST PROGRESS NOTE  Session Time: 12:30- 1:30  Participation Level: Active  Behavioral Response: Casual and NeatAlertDepressed  Type of Therapy: Individual Therapy  Treatment Goals addressed: improve psychiatric symptoms, emotional regulation   Interventions: Motivational Interviewing and Other: Grounding and mindfulness techniques  Summary: Livianna Petraglia is a 49 y.o. female who presents with Major Depressive Disorder and Generalized Anxiety Disorder  Suicidal/Homicidal: Nowithout intent/plan  Therapist Response:  Belvia met with clinician for an individual session. Monike discussed her psychiatric symptoms and her current life stressors. Amira shared that she had been having a lot of stress due to factors in her marriage. She shared about some of her husbands behaviors and how they affect her. She shared also about some of the challenges she is having with her son due to his behaviors. She shared that she believes he is on the Autism Spectrum and that he is getting tested for this. Tarri shared that her stressors cause her to cry a lot and that she feels like she has no one who understands them. Client shared some of her thoughts about the stressors. Clinician introduced grounding and mindfulness techniques. Clinician discussed the purpose for the techniques. Client and clinician practiced the techniques together. Client and clinician discussed the process and application of the techniques. Rease asked some clarifying questions. Claretha agreed to practice 2 techniques daily until next session.  Plan: Return again in 1 week.  Diagnosis: Axis I: Major Depressive Disorder and Generalized Anxiety Disorder        Daryan Cagley A, LCSW 04/22/2014

## 2014-04-24 NOTE — Addendum Note (Signed)
Addended by: Shelva Majestic A on: 04/24/2014 02:26 PM   Modules accepted: Level of Service

## 2014-04-24 NOTE — Sleep Study (Signed)
NAME: [name] DATE OF BIRTH:  12-18-1965 MEDICAL RECORD NUMBER [id number]  LOCATION: [facility]  PHYSICIAN: KELLY,THOMAS A  DATE OF STUDY: 04/22/2014  SLEEP STUDY TYPE: Split Night/Positive Airway Pressure Titration               REFERRING PHYSICIAN: Minus Breeding, MD  INDICATION FOR STUDY:  [name] is a 49 year old female who is referred for sleep study by Dr. Orlean Patten and to evaluate for sleep apnea.  Patient has a history of hypertension, obesity, and prior cardiomyopathy.  She does snore and notes daytime fatigue.  EPWORTH SLEEPINESS SCORE:  10 , which is compatible with excessive daytime sleepiness. HEIGHT: 5' 4"  (162.6 cm)  WEIGHT: 289 lb (131.09 kg)    Body mass index is 49.58 kg/(m^2).  NECK SIZE: 17 in.  MEDICATIONS:  zolpidem (AMBIEN) 5 MG tablet 5 mg, Once simvastatin (ZOCOR) 20 MG tablet 20 mg, Daily sertraline (ZOLOFT) 100 MG tablet 100 mg, 2 times daily potassium chloride SA (K-DUR,KLOR-CON) 20 MEQ tablet 20 mEq, Daily lamoTRIgine (LAMICTAL) 200 MG tablet 200 mg, Daily ibuprofen (ADVIL,MOTRIN) 800 MG tablet 800 mg, Every 8 hours PRN  Note: Received from: External Pharmacy (Written 05/20/2013 0938)  furosemide (LASIX) 40 MG tablet 40 mg, Daily enalapril (VASOTEC) 20 MG tablet 20 mg, 2 times daily clonazePAM (KLONOPIN) 1 MG tablet 1 mg, 3 times daily PRN carvedilol (COREG) 25 MG tablet 25 mg, 2 times daily with meals  View all carvedilol notes  carvedilol (COREG) 12.5 MG tablet 12.5 mg, 2 times daily with meals  View all carvedilol notes  amphetamine-dextroamphetamine (ADDERALL XR) 30 MG 24 hr capsule 30 mg, Daily amLODipine (NORVASC) 10 MG tablet 10 mg, Daily   SLEEP ARCHITECTURE:  The patient met split-night criteria.  On the diagnostic portion of the study, she slept for 120 minutes out of 122.5 minutes.  Sleep onset to sleep was 29.5 minutes.  REM sleep was not achieved.  She slept 10 minutes in stage I (8.3%), 110 minutes in stage II  (91.7%), 0 minutes in stage III or REM sleep.  Due to an AHI of greater than 15/hr after 2 hours, she was transitioned to CPAP titration.  She slept 189.5 minutes out of a sleep.  Of time of 214.5 minutes with a sleep efficiency of 88.1% with CPAP therapy.  She slept 7.5 minutes in stage I (4.0%), 169.5 minutes in stage II, and Pred disease 89.4%), 0 minutes in stage III, and 12.5 minutes and REM sleep (6.6%).  With CPAP titration she spent 91 minutes suine and 98.5 minutes with nonsupine sleep.  Moderate to severe snoring was noted on the baseline portion of the study, which resolved with CPAP therapy.  On the diagnostic baseline study there were 27 arousals with an index of 13.5 and with  CPAP there were 20 arousals with an index of 6.3   RESPIRATORY DATA:  On the baseline portion of the study, there were 2 obstructive apneas, 0 central apneas and 0 mixed apneas apneas, 40, hypopneas.  The apnea-hypopnea index (AHI) was 33.1 per hour which places the patient in the category of severe obstructive sleep apnea-hypopnea syndrome.  CPAP titration was initiated at 5 cm water pressure and was titrated up to 11 cm water pressure.  Due to continued oxygen desaturation 1 L/m of supplemental oxygen was administered.  The AHI at 11 cm pressure was 4.4/hr.   OXYGEN DATA:  On the diagnostic portion of the study, the lowest oxygen desaturation with  non-REM sleep was 69%.  REM sleep was not achieved.  With CPAP therapy, the lowest oxygen desaturation was 72% at 5 cm water pressure.  Due to continued low oxygen saturations, supplemental oxygen at 1 L/m was implemented and the O2 saturation increased to 88%.  CARDIAC DATA:  The patient was in sinus rhythm at an average heart rate of 83 bpm on the diagnostic portion and 73 bpm with CPAP titration.  There were rare to occasional PACs and PVCs.  MOVEMENT/PARASOMNIA:  There were 0 periodic limb movements.  IMPRESSION/ RECOMMENDATION:   Severe obstructive sleep  apnea/hypoxia syndrome. Absence of REM sleep on the diagnostic portion of the study. Respiratory events with oxygen desaturation to 69% with non-REM sleep on the diagnostic baseline study. Abnormal sleep architecture related to respiratory events. No evidence for nocturnal myoclonus  Successful CPAP titration with initial use of CPAP with C-Flex/EPR of 3 at 11 cm water pressure with 1 L/m of supplemental oxygen, and heated humidification.  The patient used an F&P Pilaro Q mask for this CPAP portion of the study.  Recommend download in 30 days in sleep clinic evaluation.    [place], American Board of Sleep Medicine  ELECTRONICALLY SIGNED ON:  04/24/2014, 2:04 PM [place] PH: [phone]   FX: [phone] [place]

## 2014-04-26 ENCOUNTER — Ambulatory Visit (INDEPENDENT_AMBULATORY_CARE_PROVIDER_SITE_OTHER): Payer: Medicare Other | Admitting: Clinical

## 2014-04-26 ENCOUNTER — Encounter (HOSPITAL_COMMUNITY): Payer: Self-pay | Admitting: Clinical

## 2014-04-26 DIAGNOSIS — F411 Generalized anxiety disorder: Secondary | ICD-10-CM | POA: Diagnosis not present

## 2014-04-26 DIAGNOSIS — F331 Major depressive disorder, recurrent, moderate: Secondary | ICD-10-CM | POA: Diagnosis not present

## 2014-04-26 NOTE — Progress Notes (Signed)
   THERAPIST PROGRESS NOTE  Session Time: 1:02 -2:00  Participation Level: Active  Behavioral Response: DisheveledAlertDepressed  Type of Therapy: Individual Therapy  Treatment Goals addressed: improve psychiatric symptoms, emotional regulations skills   Interventions: Motivational Interviewing and Other: Grounding and Mindfulness techniques  Summary: [name] is a 49 y.o. female who presents with Major Depressive Disorder, recurrent, moderate and generalized anxiety disorder .   Suicidal/Homicidal: Nowithout intent/plan  Therapist Response:  [name] met with clinician for an individual session. [name] discussed her psychiatric symptoms, her current life events and her homework. [name] shared that she had not been feeling well. She shared that she had an upset stomach but choose to attend session anyway for fear of being charged for not canceling 24 hours prior. [name] shared that she had gone to a sleep study and believes she has sleep apnea. She shared that this event was traumatic because when trying on the sleep mask she had sensations that reminded her of her heart problems. [name] shared that she was unable to do her daily grounding and mindfulness homework because she was too busy preparing for her sleep study. Client and clinician discussed the homework and its importance in her progress towards her goals. [name] shared a story about her family in which she began to cry. Clinician suggested that they practice a grounding technique together so that client could more easily make the transition from her therapy appointment to her regular life. Client and clinician practiced a technique together. [name] agreed to practice 2 techniques daily until next session. Clinician gave client a packet on improving self esteem (one of client's goals) [name] agreed to complete the packet and bring it to her next session.  Plan: Return again in 1 -2 weeks.  Diagnosis: Axis I: Major Depressive Disorder,  recurrent, moderate and generalized anxiety disorder .      [name] A, LCSW 04/26/2014

## 2014-04-27 DIAGNOSIS — F418 Other specified anxiety disorders: Secondary | ICD-10-CM | POA: Diagnosis not present

## 2014-04-27 DIAGNOSIS — F339 Major depressive disorder, recurrent, unspecified: Secondary | ICD-10-CM | POA: Diagnosis not present

## 2014-04-27 DIAGNOSIS — F909 Attention-deficit hyperactivity disorder, unspecified type: Secondary | ICD-10-CM | POA: Diagnosis not present

## 2014-04-27 DIAGNOSIS — M79605 Pain in left leg: Secondary | ICD-10-CM | POA: Diagnosis not present

## 2014-04-27 DIAGNOSIS — O903 Peripartum cardiomyopathy: Secondary | ICD-10-CM | POA: Diagnosis not present

## 2014-04-28 ENCOUNTER — Encounter: Payer: Self-pay | Admitting: Cardiovascular Disease

## 2014-05-02 ENCOUNTER — Telehealth: Payer: Self-pay | Admitting: Cardiology

## 2014-05-02 NOTE — Telephone Encounter (Signed)
Please call her and tell her it might be several days before the sleep doctor reads the study.  We will call her when we have the result.

## 2014-05-02 NOTE — Telephone Encounter (Signed)
I called pt back & explained the wait on results.   Pt then opened a conversation about having a lot of anxiety about wearing CPAP.  She feels this will be adding to her already apparent state of anxiety.  She states that during the sleep study, she noted to the technician that she thinks her uvula may be origin of airway obstruction and wanted to know how to proceed on the possibility of a procedure to cut or reduce the uvula. I advised that I will bring this to Dr. Rosezella Florida attention in case it had not been noted. Will defer to him for specialist referral or next steps. She feels a surgery, if warranted, would be a preference to wearing CPAP. I did explain that this may not preclude the need to wear CPAP.

## 2014-05-02 NOTE — Telephone Encounter (Signed)
Pt anxious about sleep study results. I do not see a result note.  She has CPAP company calling her and she's unsure what she needs to do as she hasn't heart results yet.

## 2014-05-02 NOTE — Telephone Encounter (Signed)
Pt would like sleep study results from 04-22-14 please.

## 2014-05-03 ENCOUNTER — Ambulatory Visit (HOSPITAL_COMMUNITY): Payer: Self-pay | Admitting: Clinical

## 2014-05-10 ENCOUNTER — Ambulatory Visit (HOSPITAL_COMMUNITY): Payer: Self-pay | Admitting: Psychiatry

## 2014-05-10 NOTE — Progress Notes (Unsigned)
05/10/14 at 3,00 PM  Phone Contact.  Patient did not come to her outpatient appt. I called her . She states she has a migraine, so could not come in for appt today. Plans to reschedule. Denies SI, states she has enough medication for now, will call if any issues prior to next appt.  Neita Garnet, MD

## 2014-05-12 ENCOUNTER — Ambulatory Visit (HOSPITAL_COMMUNITY): Payer: Self-pay | Admitting: Clinical

## 2014-05-16 ENCOUNTER — Telehealth (HOSPITAL_COMMUNITY): Payer: Self-pay

## 2014-05-17 ENCOUNTER — Ambulatory Visit (HOSPITAL_COMMUNITY): Payer: Self-pay | Admitting: Clinical

## 2014-05-24 ENCOUNTER — Ambulatory Visit (HOSPITAL_COMMUNITY): Payer: Self-pay | Admitting: Clinical

## 2014-05-29 ENCOUNTER — Other Ambulatory Visit: Payer: Self-pay | Admitting: Cardiology

## 2014-05-29 ENCOUNTER — Other Ambulatory Visit (HOSPITAL_COMMUNITY): Payer: Self-pay | Admitting: Psychiatry

## 2014-06-28 ENCOUNTER — Telehealth: Payer: Self-pay | Admitting: Cardiology

## 2014-06-28 NOTE — Telephone Encounter (Signed)
Pt had her sleep study test about 2 months ago. She was told that Dr Warren Lacy would call her with the results.Pt still have not heard anything.

## 2014-06-28 NOTE — Telephone Encounter (Signed)
Message sent to Dr.Hochrein. 

## 2014-06-29 NOTE — Telephone Encounter (Signed)
Please let her know that this was severe sleep apnea.  I am not sure why the I did not see the results in my in basket.  She needs to have an appt with Dr. Claiborne Billings

## 2014-06-29 NOTE — Telephone Encounter (Signed)
Left message for patient to return call.

## 2014-07-01 ENCOUNTER — Telehealth: Payer: Self-pay | Admitting: *Deleted

## 2014-07-01 NOTE — Telephone Encounter (Signed)
Left message to return a call to discuss sleep study results. 

## 2014-07-01 NOTE — Telephone Encounter (Signed)
Left message [facility] going to try to submit CPAP order through to Medicare since cut off missed by only 2 days. We will notify her of outcome next week.

## 2014-07-01 NOTE — Telephone Encounter (Signed)
Phone call with patient referencing her sleep study results. Her results were explained to her in great detail. All of her questions were answered to her satisfaction. Patient admits to [employer] company calling her on 3 separate occasions and she never returned a call to them. I explained to her that Medicare patients have to be set up within a certain time from the doctors appointment. She has now passed the 6 month time frame by 2 days. I have spoken with Blair Heys with [name]. She is going to attempt to run it through Medicare to see if it will be accepted. If not the patient may have to repeat the sleep study. Patient was made aware of this.

## 2014-07-01 NOTE — Telephone Encounter (Signed)
Appt canceled per Mariann Laster - pt needs to be on CPAP x2 months before return visit. Pt was given this instruction, advised to call at her convenience to set this up.

## 2014-07-01 NOTE — Telephone Encounter (Signed)
Called pt in regards to Dr. Rosezella Florida last office note.  This was a lengthy conversation (+20 mins.) Pt states she was hesitant to start CPAP, O2. Still wants to see a specialist about her uvula. Informed that best recommendation was to start on CPAP use asap. We discussed rationale for CPAP, home O2 equipment, this was ordered by Dr. Claiborne Billings - explained to pt that she needed to be seen by Dr. Claiborne Billings to f/u on this. Scheduled appt for 6/27.  Some evident emotional barriers present when trying to communicate w/ patient. Known psych history which she acknowledges. She continually restates hesitancy to begin suggested interventions for her sleep apnea. Compliance may be at issue due to patient's readiness for decision making. She does verbalize understanding of the importance of starting on CPAP at physician's order, f/u w/ Dr. Claiborne Billings for this.

## 2014-07-03 ENCOUNTER — Other Ambulatory Visit (HOSPITAL_COMMUNITY): Payer: Self-pay | Admitting: Psychiatry

## 2014-07-04 ENCOUNTER — Telehealth: Payer: Self-pay | Admitting: Cardiovascular Disease

## 2014-07-04 NOTE — Telephone Encounter (Signed)
Jeani Hawking is calling back to speak with Mariann Laster about an order that was put in for the pt. Please call  Thanks

## 2014-07-05 ENCOUNTER — Ambulatory Visit (INDEPENDENT_AMBULATORY_CARE_PROVIDER_SITE_OTHER): Payer: Medicare Other | Admitting: Psychiatry

## 2014-07-05 ENCOUNTER — Encounter (HOSPITAL_COMMUNITY): Payer: Self-pay | Admitting: Psychiatry

## 2014-07-05 VITALS — BP 128/84 | HR 88 | Ht 64.0 in | Wt 291.2 lb

## 2014-07-05 DIAGNOSIS — F419 Anxiety disorder, unspecified: Secondary | ICD-10-CM | POA: Diagnosis not present

## 2014-07-05 DIAGNOSIS — F331 Major depressive disorder, recurrent, moderate: Secondary | ICD-10-CM | POA: Diagnosis not present

## 2014-07-05 MED ORDER — SERTRALINE HCL 100 MG PO TABS: 100.0000 mg | ORAL_TABLET | Freq: Two times a day (BID) | ORAL | Status: DC

## 2014-07-05 MED ORDER — LAMOTRIGINE 200 MG PO TABS: 200.0000 mg | ORAL_TABLET | Freq: Every day | ORAL | Status: DC

## 2014-07-05 MED ORDER — CLONAZEPAM 1 MG PO TABS: 1.0000 mg | ORAL_TABLET | Freq: Two times a day (BID) | ORAL | Status: DC | PRN

## 2014-07-05 NOTE — Telephone Encounter (Signed)
Returned a call to [employer] with [name]. She called to inform me that the order for the patient's oxygen will not be covered due to the patient not having a lung diagnosis. The patient would also need to go have another CPAP titration study with specific diagnosis termination of hypoxemia or some type of hyperventilation and more than likely it will still be denied. The orders for the CPAP machine has been done and Blair Heys is working to get the patient set up for this. Dr. Claiborne Billings will be notified of this.

## 2014-07-05 NOTE — Progress Notes (Signed)
[facility] [phone] Progress Note  [name] [id number] 49 y.o.  07/05/2014 2:23 PM  Subjective - at this time patient is improved . She had not returned to clinic in two to three months, but had maintained some phone contact . She had been feeling more depressed in the context of ( primarily) , marital stress, but also related to significant financial difficulties . Her marital situation is currently stabilized , and her mood is better at this time.  Objective - patient has a history of severe recurrent major depression. She had been feeling more depressed recently , after she found out her husband, who is from [place], had travelled there  For several weeks . He returned a few weeks ago, and as noted, she is now feeling better. She states she is less depressed, and presents with a full range of affect. Insofar as medications , she denies medication side effects and she feels they are helping . She is on Klonopin, Lamictal, Zoloft. She is also on Stimulant medication prescribed by her PCP.( Denies side effects, states she feels better on it, with improved concentration and energy)  She states she takes one and sometimes two Klonopins per day, but not more than that- denies any abuse or misuse. Patient has a history of Chronic Cardial Illness/CHF- states she has been stable , and denies any current CHF symptoms. Of note, she states she was recently diagnosed with sleep apnea and is in the process of  Being fitted with CPAP mask.  Today she came to session with her husband and  At her request I met with her first and then with both of them together. Meeting with patient was 20 minutes and with patient/husband 25 minutes . They  spoke about their  situation/stressors.  Husband has a job in [place], but does not need to be there all the time and can often stay in [place] several weeks at a time. Husband, who is from [place], has a  Young son there , who lives with the mother/mother's  family. He states that he feels obligated to go visit periodically, and states that there are also cultural issues he has to take into account and that a child with no father is less tolerated /accepted by society there, so he feels obligated to go periodically.  He  recently left for [place] for several weeks as he found out that son was having foot surgery. He admits it is difficult for him to face patient and let her know he is leaving, " because I don't want to ruin her day", so he often calls after he has left . Patient reports that  When he is gone she tends to feel worse, very depressed, sad, and subjectively helpless with issues such as car breaking down, dealing with unforseen issues, as husband normally deals with these. She also says that because this has happened a few times already, she feels more distrustful of him  and wants him to stop going there without her, and wants him to consider bringing his son to live with them here in [place] so he does not feel compelled to leave . This stressor is not new, and has  Caused marital tension although they state that when husband is here they have a pleasant , good relationship. She denies any domestic violence. Both reiterated their love for each other during meeting.    History of Present Illness: Suicidal Ideation: No Plan Formed: No Patient has means to carry out plan:  No  Homicidal Ideation: No Plan Formed: No Patient has means to carry out plan: No  Review of Systems: Psychiatric: Agitation: No Hallucination: No Depressed Mood: Yes- but improved  Insomnia: Yes- improved as well  Hypersomnia: No Altered Concentration: No Feels Worthless: Yes- improved over recent weeks , tends to feel better when husband in town. Grandiose Ideas: No Belief In Special Powers: No New/Increased Substance Abuse: No Compulsions: No  Neurologic: Headache: No Seizure: No Paresthesias: No  Past Medical Family, Social History:  See above    Outpatient Encounter Prescriptions as of 07/05/2014  Medication Sig  . amLODipine (NORVASC) 10 MG tablet Take 1 tablet (10 mg total) by mouth daily.  [name] amphetamine-dextroamphetamine (ADDERALL XR) 30 MG 24 hr capsule Take 1 capsule (30 mg total) by mouth daily.  . carvedilol (COREG) 12.5 MG tablet Take 1 tablet (12.5 mg total) by mouth 2 (two) times daily with a meal.  . carvedilol (COREG) 25 MG tablet Take 25 mg by mouth 2 (two) times daily with a meal.  . clonazePAM (KLONOPIN) 1 MG tablet Take 1 tablet (1 mg total) by mouth 3 (three) times daily as needed for anxiety.  . enalapril (VASOTEC) 20 MG tablet Take 1 tablet (20 mg total) by mouth 2 (two) times daily.  . enalapril (VASOTEC) 20 MG tablet TAKE 1 TABLET (20 MG TOTAL) BY MOUTH 2 (TWO) TIMES DAILY.  . furosemide (LASIX) 40 MG tablet Take 1 tablet (40 mg total) by mouth daily.  . furosemide (LASIX) 40 MG tablet TAKE 1 TABLET (40 MG TOTAL) BY MOUTH DAILY.  [name] ibuprofen (ADVIL,MOTRIN) 800 MG tablet Take 800 mg by mouth every 8 (eight) hours as needed for headache or mild pain.   [name] lamoTRIgine (LAMICTAL) 200 MG tablet Take 1 tablet (200 mg total) by mouth daily.  . potassium chloride SA (K-DUR,KLOR-CON) 20 MEQ tablet Take 1 tablet (20 mEq total) by mouth daily.  . sertraline (ZOLOFT) 100 MG tablet Take 1 tablet (100 mg total) by mouth 2 (two) times daily.  . simvastatin (ZOCOR) 20 MG tablet Take 1 tablet (20 mg total) by mouth daily.  [name] zolpidem (AMBIEN) 5 MG tablet Take 1 tablet (5 mg total) by mouth once. For use at sleep study on 04/22/14.   No facility-administered encounter medications on file as of 07/05/2014.    Past Psychiatric History/Hospitalization(s): Anxiety: Yes Bipolar Disorder: No Depression: Yes Mania: No Psychosis: No Schizophrenia: No Personality Disorder: No Hospitalization for psychiatric illness: Yes History of Electroconvulsive Shock Therapy: No Prior Suicide Attempts: No  Physical Exam: Constitutional:  BP  128/84 mmHg  Pulse 88  Ht 5' 4"  (1.626 m)  Wt 291 lb 3.2 oz (132.087 kg)  BMI 49.96 kg/m2  General Appearance: alert, oriented, no acute distress and obese  Musculoskeletal: Strength & Muscle Tone: within normal limits Gait & Station: normal Patient leans: N/A  Psychiatric: Speech (describe rate, volume, coherence, spontaneity, and abnormalities if any):  Normal in tone, volume and rate   Thought Process (describe rate, content, abstract reasoning, and computation):  Linear ,well organized   Associations: Coherent and Relevant  Thoughts: no hallucinations, no delusions, not internally preoccupied   Mental Status: Orientation: fully oriented x 3  Mood & Affect: normal affect and at this time reactive , bright affect  Attention Span & Concentration:  Within normal   Medical Decision Making (Choose Three): Established Problem, Stable/Improving (1), Review of Psycho-Social Stressors (1) and Review of Medication Regimen & Side Effects (2)  Assessment: patient currently significantly  improved compared to recent. She returns for appt after 2-3 months. Had been feeling more depressed but is now better, and today seems euthymic. Describes her depressive symptoms fluctuate at least partially relating to marital stress. Husband is from [place] and sometimes travels there for weeks to be with a son he has there, often without telling her or allowing her to prepare for this. Patient states  This situation has resulted in decreased trust and worries  about infidelity.   At this time, she reports improved mood and admits she feels much better when " he is home". Tolerating medications well, denies side effects.  Axis I:  MDD, Recurrent, no psychotic symptoms  Axis II:  Deferred   Axis III: Obesity, history of CHF, history of Sleep Apnea  Axis IV: marital stress, son has Asperger's Disorder, financial stressors  Axis V: 60-65 at present    Plan:  1. Lamictal 200 mgrs QDAY, Zoloft 100  mgrs BID for Depression. 2. Klonopin 1 mgr BID PRN for Anxiety. 3. Encouraged patient to work on getting her CPAP to manage sleep apnea and we reviewed how Sleep apnea can adversely affect mood . 4. Encouraged FAMILY /COUPLES THERAPY and offered to provide referral if wanted . 5. Have reviewed with patient concern about stimulant medication being potentially addictive and potentially causing cardiac side effects- she states her cardiologist is aware of medication/dose and that she functions better on it . It is being prescribed by her PCP. Of note, patient requests 90 day scripts as it makes her copay affordable . Agrees to return in one month, sooner if any worsening issue prior .   Neita Garnet, MD 07/05/2014

## 2014-07-05 NOTE — Telephone Encounter (Signed)
acknowledged

## 2014-07-12 ENCOUNTER — Telehealth: Payer: Self-pay | Admitting: Cardiology

## 2014-07-12 MED ORDER — CARVEDILOL 12.5 MG PO TABS: 12.5000 mg | ORAL_TABLET | Freq: Two times a day (BID) | ORAL | Status: DC

## 2014-07-12 MED ORDER — CARVEDILOL 25 MG PO TABS: 25.0000 mg | ORAL_TABLET | Freq: Two times a day (BID) | ORAL | Status: DC

## 2014-07-12 MED ORDER — ENALAPRIL MALEATE 20 MG PO TABS: 20.0000 mg | ORAL_TABLET | Freq: Two times a day (BID) | ORAL | Status: DC

## 2014-07-12 MED ORDER — FUROSEMIDE 40 MG PO TABS: 40.0000 mg | ORAL_TABLET | Freq: Every day | ORAL | Status: DC

## 2014-07-12 MED ORDER — AMLODIPINE BESYLATE 10 MG PO TABS: 10.0000 mg | ORAL_TABLET | Freq: Every day | ORAL | Status: DC

## 2014-07-12 MED ORDER — SIMVASTATIN 20 MG PO TABS: 20.0000 mg | ORAL_TABLET | Freq: Every day | ORAL | Status: DC

## 2014-07-12 MED ORDER — POTASSIUM CHLORIDE CRYS ER 20 MEQ PO TBCR: 20.0000 meq | EXTENDED_RELEASE_TABLET | Freq: Every day | ORAL | Status: DC

## 2014-07-12 NOTE — Telephone Encounter (Signed)
°  1. Which medications need to be refilled? Carvedilol 12.5&25mg , Klor- Con, Simvastatin, Enalapril,Amlodipine,Furosemide, and Zolpidem  2. Which pharmacy is medication to be sent to? [facility] on [address].   3. Do they need a 30 day or 90 day supply? 90  4. Would they like a call back once the medication has been sent to the pharmacy? Yes   * She also would like to speak with Dr. Evette Georges nurse about the sleep apnea and oxygen test. She says Mariann Laster was suppose to contact her last week. *   Please call Thanks

## 2014-07-12 NOTE — Telephone Encounter (Signed)
Rx(s) sent to pharmacy electronically. Patient notified.   Appointment rescheduled from July to June per patient preference.   Message routed to [place] to contact patient

## 2014-07-14 ENCOUNTER — Telehealth: Payer: Self-pay | Admitting: *Deleted

## 2014-07-14 ENCOUNTER — Telehealth: Payer: Self-pay | Admitting: Cardiovascular Disease

## 2014-07-14 NOTE — Telephone Encounter (Signed)
Please call.

## 2014-07-14 NOTE — Telephone Encounter (Signed)
Returned corrected CPAP order to [facility] signed by Dr. Percival Spanish (primary cardiologist) in Dr. Evette Georges absence.

## 2014-07-14 NOTE — Telephone Encounter (Signed)
Returned a call to [place] [facility]. She called to inform me that the CPAP order received was stamped and medicare will not accept. She will send over a new order on their sheet for a hand signature. Since Dr. Claiborne Billings is out of town this week I will have her primary cardiologist Dr. Percival Spanish to sign the order for patient to receive her CPAP and supplies.

## 2014-07-15 NOTE — Telephone Encounter (Signed)
Can this encounter be closed?

## 2014-07-20 DIAGNOSIS — F909 Attention-deficit hyperactivity disorder, unspecified type: Secondary | ICD-10-CM | POA: Diagnosis not present

## 2014-07-20 DIAGNOSIS — M25561 Pain in right knee: Secondary | ICD-10-CM | POA: Diagnosis not present

## 2014-07-20 DIAGNOSIS — G4733 Obstructive sleep apnea (adult) (pediatric): Secondary | ICD-10-CM | POA: Diagnosis not present

## 2014-07-20 DIAGNOSIS — G5602 Carpal tunnel syndrome, left upper limb: Secondary | ICD-10-CM | POA: Diagnosis not present

## 2014-07-20 DIAGNOSIS — G5601 Carpal tunnel syndrome, right upper limb: Secondary | ICD-10-CM | POA: Diagnosis not present

## 2014-07-20 DIAGNOSIS — M545 Low back pain: Secondary | ICD-10-CM | POA: Diagnosis not present

## 2014-07-20 DIAGNOSIS — M79672 Pain in left foot: Secondary | ICD-10-CM | POA: Diagnosis not present

## 2014-07-20 DIAGNOSIS — F418 Other specified anxiety disorders: Secondary | ICD-10-CM | POA: Diagnosis not present

## 2014-08-02 ENCOUNTER — Ambulatory Visit (INDEPENDENT_AMBULATORY_CARE_PROVIDER_SITE_OTHER): Payer: Medicare Other | Admitting: Psychiatry

## 2014-08-02 ENCOUNTER — Encounter (HOSPITAL_COMMUNITY): Payer: Self-pay | Admitting: Psychiatry

## 2014-08-02 DIAGNOSIS — F419 Anxiety disorder, unspecified: Secondary | ICD-10-CM

## 2014-08-02 DIAGNOSIS — F339 Major depressive disorder, recurrent, unspecified: Secondary | ICD-10-CM

## 2014-08-02 DIAGNOSIS — F331 Major depressive disorder, recurrent, moderate: Secondary | ICD-10-CM

## 2014-08-02 MED ORDER — SERTRALINE HCL 100 MG PO TABS: 100.0000 mg | ORAL_TABLET | Freq: Two times a day (BID) | ORAL | Status: DC

## 2014-08-02 MED ORDER — LAMOTRIGINE 200 MG PO TABS: 200.0000 mg | ORAL_TABLET | Freq: Every day | ORAL | Status: DC

## 2014-08-02 MED ORDER — CLONAZEPAM 1 MG PO TABS: 1.0000 mg | ORAL_TABLET | Freq: Two times a day (BID) | ORAL | Status: DC | PRN

## 2014-08-02 NOTE — Progress Notes (Signed)
[facility] MD Progress Note  08/02/2014 2:33 PM [name]  MRN:  [id number] Subjective:   Patient states she feels better. She has been less depressed, and has been feeling more animated. She is not anhedonic at this time and for example she states she has started gardening as a pastime, is more interested in reading.  She is also less anxious overall. At this time she is tolerating medications well and denies medication side effects. Of note, she states she was recently formally diagnosed with Sleep Apnea. She had been fearful of CPAP machine, as when she first tried it on she felt smothered, short of breath, very anxious. Thankfully, she states she was given a larger mask she tolerates well, and has been using it regularly, with overall improvement in quality of sleep.   Objective : Patient presents with improved mood and affect . At this time euthymic, with a full range of affect.  Mood seems to have improved/ stabilized at least partly in the context of improved marital relationship. Her husband has been home consistently except for short trips to [facility] on business, and she states " when he is here with me it is wonderful".  She is not describing or endorsing any current neuro-vegetative symptoms of depression. She has a history of pregnancy related cardiomyopathy, for which she follows up with a Cardiologist , no recent CHF symptoms endorsed . Of note, patient states that during her most recent episode of depression, which was severe, she had stopped going to her therapist at [facility], due to anhedonia, lack of energy to get out of the house. Now that she is feeling better, she is wanting to resume treatment , as she felt it was helping. I have encouraged her to do so. She is denying any abuse or misuse of prescribed Klonopin- states she occasionally takes less than prescribed , but most often takes it as prescribed. We have reviewed side effects,and she is aware of abuse potential. Regarding  Lamictal, patient has been on this medication for years with good response and tolerance. There is no clear history of bipolarity, but she feels Lamictal has helped her mood .    Principal Problem:  Major Depression  Recurrent .  Diagnosis:   Patient Active Problem List   Diagnosis Date Noted  . Chronic systolic CHF (congestive heart failure), NYHA class 3 [I50.22] 12/06/2013  . MDD (major depressive disorder), recurrent episode, severe [F33.2] 12/05/2013  . Noncompliance with medications [Z91.14] 11/28/2013  . Acute on chronic systolic CHF (congestive heart failure), NYHA class 3 [I50.23] 11/27/2013  . Dyslipidemia [E78.5] 04/04/2011  . Cervical radicular pain [M54.12] 03/25/2011  . Neck pain, acute [M54.2] 03/08/2011  . CARPAL TUNNEL SYNDROME, RIGHT [G56.00] 04/05/2009  . ABNORMAL ELECTROCARDIOGRAM [R94.31] 01/16/2009  . Uncontrolled hypertension [I10] 06/03/2008  . Morbid obesity [E66.01] 07/12/2006  . DEPRESSION [F32.9] 07/12/2006  . Attention deficit hyperactivity disorder (ADHD) [F90.9] 07/12/2006  . MIGRAINE HEADACHE [G43.909] 07/12/2006  . 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 ADHD   . CARDIOMYOPATHY, PERIPARTUM, POSTPARTUM     a. EF previously 30-40%,  b.  resolved, EF 60%    c.  echo 10/2011:  mild LVH, EF 50%, mild LAE,   . MIGRAINE HEADACHE   . OBESITY   . DEPRESSION   . Essential hypertension, benign   . CARPAL TUNNEL SYNDROME, RIGHT   . Anxiety   .  Edema     a. LE venous dopplers negative for DVT 10/2011  . CHF (congestive heart failure)   . Chronic systolic CHF (congestive heart failure), NYHA class 3 12/06/2013    Past Surgical History  Procedure Laterality Date  . Child birth  2003  . Cholecystectomy     Family History:  Family History  Problem Relation Age of Onset  . Hypertension Other   . Diabetes Other   . Colitis  Other   . Alcohol abuse Other   . Depression Mother   . Anxiety disorder Mother   . ADD / ADHD Father    Social History:  History  Alcohol Use No     History  Drug Use No    History   Social History  . Marital Status: Married    Spouse Name: N/A  . Number of Children: N/A  . Years of Education: N/A   Social History Main Topics  . Smoking status: Former Smoker    Quit date: 02/25/1997  . Smokeless tobacco: Not on file     Comment: Married, lives with spouse (when he is not traveling) and son  . Alcohol Use: No  . Drug Use: No  . Sexual Activity: Not Currently   Other Topics Concern  . Not on file   Social History Narrative   Additional History:    Sleep: improved   Appetite:  Good   Assessment:   Musculoskeletal: Strength & Muscle Tone: within normal limits Gait & Station: normal Patient leans: N/A   Psychiatric Specialty Exam: Physical Exam  ROS no rash, no current  shortness of breath or chest pain . No Nausea, no vomiting.  There were no vitals taken for this visit.There is no weight on file to calculate BMI.  General Appearance: Well Groomed  Engineer, water::  Good  Speech:  Normal Rate  Volume:  Normal  Mood:  Euthymic  Affect:  Appropriate and Full Range  Thought Process:  Goal Directed and Linear  Orientation:  Full (Time, Place, and Person)  Thought Content:  denies hallucinations, no delusions   Suicidal Thoughts:  No  Homicidal Thoughts:  No  Memory:  recent and remote intact   Judgement:  Good  Insight:  Present  Psychomotor Activity:  Normal  Concentration:  Good  Recall:  Good  Fund of Knowledge:Good  Language: Good  Akathisia:  Negative  Handed:  Right  AIMS (if indicated):     Assets:  Communication Skills Desire for Improvement Housing Resilience  ADL's:  Intact- improved compared to prior visits   Cognition: WNL  Sleep:   States she is sleeping better now that she has a CPAP machine .     Current Medications: Current  Outpatient Prescriptions  Medication Sig Dispense Refill  . amLODipine (NORVASC) 10 MG tablet Take 1 tablet (10 mg total) by mouth daily. 90 tablet 3  . amphetamine-dextroamphetamine (ADDERALL XR) 30 MG 24 hr capsule Take 1 capsule (30 mg total) by mouth daily. 30 capsule 0  . carvedilol (COREG) 12.5 MG tablet Take 1 tablet (12.5 mg total) by mouth 2 (two) times daily with a meal. 180 tablet 3  . carvedilol (COREG) 25 MG tablet Take 1 tablet (25 mg total) by mouth 2 (two) times daily with a meal. 180 tablet 3  . clonazePAM (KLONOPIN) 1 MG tablet Take 1 tablet (1 mg total) by mouth 2 (two) times daily as needed for anxiety. 60 tablet 1  . enalapril (VASOTEC) 20 MG tablet TAKE  1 TABLET (20 MG TOTAL) BY MOUTH 2 (TWO) TIMES DAILY. 180 tablet 0  . enalapril (VASOTEC) 20 MG tablet Take 1 tablet (20 mg total) by mouth 2 (two) times daily. 180 tablet 3  . furosemide (LASIX) 40 MG tablet TAKE 1 TABLET (40 MG TOTAL) BY MOUTH DAILY. 90 tablet 0  . furosemide (LASIX) 40 MG tablet Take 1 tablet (40 mg total) by mouth daily. 90 tablet 3  . ibuprofen (ADVIL,MOTRIN) 800 MG tablet Take 800 mg by mouth every 8 (eight) hours as needed for headache or mild pain.     [name] lamoTRIgine (LAMICTAL) 200 MG tablet Take 1 tablet (200 mg total) by mouth daily. 90 tablet 0  . potassium chloride SA (K-DUR,KLOR-CON) 20 MEQ tablet Take 1 tablet (20 mEq total) by mouth daily. 90 tablet 3  . sertraline (ZOLOFT) 100 MG tablet Take 1 tablet (100 mg total) by mouth 2 (two) times daily. 180 tablet 0  . simvastatin (ZOCOR) 20 MG tablet Take 1 tablet (20 mg total) by mouth daily. 90 tablet 3  . zolpidem (AMBIEN) 5 MG tablet Take 1 tablet (5 mg total) by mouth once. For use at sleep study on 04/22/14. 1 tablet 0   No current facility-administered medications for this visit.    Lab Results: No results found for this or any previous visit (from the past 48 hour(s)).  Physical Findings: AIMS:  , ,  ,  ,    CIWA:    COWS:       Assessment- at this time patient stable, euthymic, no symptoms of depression. Doing well in daily activities. Improvement seems at least partly to coincide with and be related to improved relationship with husband, and increased marital stability at this time. Tolerating medications well, feels they are helping. Of note, now on CPAP for documented sleep apnea.  Treatment Plan Summary: Will see patient in 8 weeks, but agrees to contact me sooner if any worsening prior. Have renewed medications as above - ( Lamictal 200 mgrs QDAY, Zoloft 100 mgrs BID, Klonopin 1 mgr BID ). Of note, patient has requested 90 day refill in order to minimize copay .  As above, Patient is wanting to resume psychotherapy  With her therapist at [facility] ). States she had stopped going , had missed appt. During most recent episode of severe depression, due to " not having the energy to make the appt because of my depression". Now that she is better wants to resume treatment there , as she feels it was very helpful. I support this and have encouraged her to do so.    Medical Decision Making:  Established Problem, Stable/Improving (1), Review of Psycho-Social Stressors (1) and Review of Medication Regimen & Side Effects (2)     COBOS, FERNANDO 08/02/2014, 2:33 PM

## 2014-08-11 ENCOUNTER — Encounter: Payer: Self-pay | Admitting: Cardiology

## 2014-08-11 ENCOUNTER — Ambulatory Visit (INDEPENDENT_AMBULATORY_CARE_PROVIDER_SITE_OTHER): Payer: Medicare Other | Admitting: Cardiology

## 2014-08-11 VITALS — BP 120/90 | HR 91 | Ht 64.0 in | Wt 295.2 lb

## 2014-08-11 DIAGNOSIS — I5022 Chronic systolic (congestive) heart failure: Secondary | ICD-10-CM | POA: Diagnosis not present

## 2014-08-11 DIAGNOSIS — I10 Essential (primary) hypertension: Secondary | ICD-10-CM

## 2014-08-11 NOTE — Patient Instructions (Signed)
Your physician recommends that you schedule a follow-up appointment in 1 month with Dr Claiborne Billings for evaluation of sleep apnea.  Dr Percival Spanish recommends that you schedule a follow-up appointment in 6 months. You will receive a reminder letter in the mail two months in advance. If you don't receive a letter, please call our office to schedule the follow-up appointment.

## 2014-08-11 NOTE — Progress Notes (Signed)
HPI The patient presents for evaluation of edema and palpitations and cardiomyopathy.   Her last EF was 35%.  She did have a stress perfusion study that did not demonstrate ischemia.  She had a sleep study.  She has been diagnosed with sleep apnea.  She seems to be doing a little bit better emotionally. She seems to be euvolemic. Her breathing is not acutely decompensated. She still gets short of breath with some activities. Her weight is up slowly but I don't think this is fluid weight. She's not having any chest discomfort, neck or arm discomfort. He's not having any palpitations, presyncope or syncope. Her edema is reduced.  Allergies  Allergen Reactions  . Duloxetine     REACTION: Sucidal thoughts  . Penicillins     REACTION: rash  . Prozac [Fluoxetine Hcl] Other (See Comments)    More depressed    Current Outpatient Prescriptions  Medication Sig Dispense Refill  . amLODipine (NORVASC) 10 MG tablet Take 1 tablet (10 mg total) by mouth daily. 90 tablet 3  . amphetamine-dextroamphetamine (ADDERALL XR) 30 MG 24 hr capsule Take 1 capsule (30 mg total) by mouth daily. 30 capsule 0  . carvedilol (COREG) 12.5 MG tablet Take 1 tablet (12.5 mg total) by mouth 2 (two) times daily with a meal. 180 tablet 3  . carvedilol (COREG) 25 MG tablet Take 1 tablet (25 mg total) by mouth 2 (two) times daily with a meal. 180 tablet 3  . clonazePAM (KLONOPIN) 1 MG tablet Take 1 tablet (1 mg total) by mouth 2 (two) times daily as needed for anxiety. 60 tablet 0  . enalapril (VASOTEC) 20 MG tablet TAKE 1 TABLET (20 MG TOTAL) BY MOUTH 2 (TWO) TIMES DAILY. 180 tablet 0  . enalapril (VASOTEC) 20 MG tablet Take 1 tablet (20 mg total) by mouth 2 (two) times daily. 180 tablet 3  . furosemide (LASIX) 40 MG tablet TAKE 1 TABLET (40 MG TOTAL) BY MOUTH DAILY. 90 tablet 0  . furosemide (LASIX) 40 MG tablet Take 1 tablet (40 mg total) by mouth daily. 90 tablet 3  . ibuprofen (ADVIL,MOTRIN) 800 MG tablet Take 800 mg by  mouth every 8 (eight) hours as needed for headache or mild pain.     [name] lamoTRIgine (LAMICTAL) 200 MG tablet Take 1 tablet (200 mg total) by mouth daily. 90 tablet 1  . potassium chloride SA (K-DUR,KLOR-CON) 20 MEQ tablet Take 1 tablet (20 mEq total) by mouth daily. 90 tablet 3  . sertraline (ZOLOFT) 100 MG tablet Take 1 tablet (100 mg total) by mouth 2 (two) times daily. 180 tablet 1  . simvastatin (ZOCOR) 20 MG tablet Take 1 tablet (20 mg total) by mouth daily. 90 tablet 3  . zolpidem (AMBIEN) 5 MG tablet Take 1 tablet (5 mg total) by mouth once. For use at sleep study on 04/22/14. 1 tablet 0   No current facility-administered medications for this visit.    Past Medical History  Diagnosis Date  . ADHD   . CARDIOMYOPATHY, PERIPARTUM, POSTPARTUM     a. EF previously 30-40%,  b.  resolved, EF 60%    c.  echo 10/2011:  mild LVH, EF 50%, mild LAE,   . MIGRAINE HEADACHE   . OBESITY   . DEPRESSION   . Essential hypertension, benign   . CARPAL TUNNEL SYNDROME, RIGHT   . Anxiety   . Edema     a. LE venous dopplers negative for DVT 10/2011  . CHF (congestive heart  failure)   . Chronic systolic CHF (congestive heart failure), NYHA class 3 12/06/2013    Past Surgical History  Procedure Laterality Date  . Child birth  2003  . Cholecystectomy     ROS:  As stated in the HPI and negative for all other systems.  PHYSICAL EXAM BP 120/90 mmHg  Pulse 91  Ht 5\' 4"  (1.626 m)  Wt 295 lb 3.2 oz (133.902 kg)  BMI 50.65 kg/m2 GENERAL:  Well appearing NECK:  No jugular venous distention, waveform within normal limits, carotid upstroke brisk and symmetric, no bruits, no thyromegaly LUNGS:  Clear to auscultation bilaterally BACK:  No CVA tenderness CHEST:  Unremarkable HEART:  PMI not displaced or sustained,S1 and S2 within normal limits, no S3, no S4, no clicks, no rubs, no murmurs ABD:  Flat, positive bowel sounds normal in frequency in pitch, no bruits, no rebound, no guarding, no midline pulsatile  mass, no hepatomegaly, no splenomegaly, obese EXT:  2 plus pulses throughout, mild to trace left greater than right lower extremity edema, no cyanosis no clubbing NEURO: Nonfocal.    EKG:  Sinus rhythm, rate 91, axis within normal limits, poor anterior R wave progression, no acute ST-T wave changes. 08/11/2014  ASSESSMENT AND PLAN  EDEMA - She will continue the meds as listed.  She seems to be euvolemic  CARDIOMYOPATHY, PERIPARTUM, POSTPARTUM -  Her ejection fraction is 35%. She is at target with her meds. I do think she seems to be euvolemic at this point.  She will remain on the meds as listed.    ESSENTIAL HYPERTENSION, BENIGN -  This is being managed in the context of treating his CHF.  No change in therapy is indicated.   ANXIETY DEPRESSION - This has been managed by a psychiatrist  She seems to be doing better.   APNEA - She has been diagnosed with sleep apnea. She is wearing CPAP and having a little trouble with it. I will schedule her to see Dr. Claiborne Billings.

## 2014-08-22 ENCOUNTER — Encounter: Payer: Self-pay | Admitting: Cardiovascular Disease

## 2014-08-22 ENCOUNTER — Ambulatory Visit: Payer: Self-pay | Admitting: Cardiovascular Disease

## 2014-08-30 ENCOUNTER — Encounter: Payer: Self-pay | Admitting: *Deleted

## 2014-09-01 ENCOUNTER — Telehealth: Payer: Self-pay | Admitting: Cardiovascular Disease

## 2014-09-01 NOTE — Telephone Encounter (Signed)
[name] is is calling because she has some questions about her machine , please call    Thanks

## 2014-09-05 NOTE — Telephone Encounter (Signed)
Returned a call to patient on Friday 09/02/14. She informs me that  She has some problems with her machine.complains that she has not heard back from [place] with [facility], and she is leaving town this weekend. I told her that I will call to see if I can get in touch with Caryl Pina and have her to contact before she leaves town.

## 2014-09-07 ENCOUNTER — Ambulatory Visit: Payer: Self-pay | Admitting: Cardiology

## 2014-09-15 ENCOUNTER — Ambulatory Visit: Payer: Self-pay | Admitting: Cardiovascular Disease

## 2014-09-16 ENCOUNTER — Telehealth: Payer: Self-pay | Admitting: Cardiovascular Disease

## 2014-09-16 NOTE — Telephone Encounter (Signed)
Received a call from [facility] with [name].She stated they received form back on patient,but needs Dr.Kelly's signature.She will refax form.

## 2014-10-04 ENCOUNTER — Ambulatory Visit (INDEPENDENT_AMBULATORY_CARE_PROVIDER_SITE_OTHER): Payer: Medicare Other | Admitting: Psychiatry

## 2014-10-04 VITALS — BP 118/88 | HR 112 | Ht 64.0 in | Wt 294.2 lb

## 2014-10-04 DIAGNOSIS — F339 Major depressive disorder, recurrent, unspecified: Secondary | ICD-10-CM

## 2014-10-04 DIAGNOSIS — F331 Major depressive disorder, recurrent, moderate: Secondary | ICD-10-CM

## 2014-10-05 ENCOUNTER — Encounter (HOSPITAL_COMMUNITY): Payer: Self-pay | Admitting: Psychiatry

## 2014-10-05 NOTE — Progress Notes (Signed)
[facility] MD Progress Note  10/05/2014 4:58 PM [name]  MRN:  [id number] Subjective:  Patient reports feeling more depressed recently, which she attributes to a series of stressors. Her husband has again returned to [place] , and she has difficulty handling daily household responsibilities on her own. She states her son , who has autism spectrum disorder, can be challenging to manage as well. Her major issue is that she " had told my husband that if he did this to me again I would leave him" so that now that he has again left to [place] /left her alone she is thinking about separation /divorce, which is " really difficult to think about ". States she recently visited her family in [place], and recently took her son to [place].  She is hoping she can relocate to [facility] as she has family there but does not have the finances. She states that she thought she was going to inherit a house from family member, but got only a small share . Objective : Patient presents depressed, ruminative about above stressors, intermittently tearful. However, range of affect improved as session progressed, and she did smile at times appropriately during session. Describes some decreased energy level , at times feels anhedonic, although states she enjoyed recent  Trips to [place] and to [place] .  Denies suicidal ideations. States medications are well tolerated, denies side effects. Denies abusing prescribed medications . Principal Problem:  MDD without psychotic features, recurrent . Diagnosis:   Patient Active Problem List   Diagnosis Date Noted  . Chronic systolic CHF (congestive heart failure), NYHA class 3 [I50.22] 12/06/2013  . MDD (major depressive disorder), recurrent episode, severe [F33.2] 12/05/2013  . Noncompliance with medications [Z91.14] 11/28/2013  . Acute on chronic systolic CHF (congestive heart failure), NYHA class 3 [I50.23] 11/27/2013  . Dyslipidemia [E78.5] 04/04/2011  . Cervical radicular pain [M54.12]  03/25/2011  . Neck pain, acute [M54.2] 03/08/2011  . CARPAL TUNNEL SYNDROME, RIGHT [G56.00] 04/05/2009  . ABNORMAL ELECTROCARDIOGRAM [R94.31] 01/16/2009  . Uncontrolled hypertension [I10] 06/03/2008  . Morbid obesity [E66.01] 07/12/2006  . DEPRESSION [F32.9] 07/12/2006  . Attention deficit hyperactivity disorder (ADHD) [F90.9] 07/12/2006  . MIGRAINE HEADACHE [G43.909] 07/12/2006  . 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 ADHD   . CARDIOMYOPATHY, PERIPARTUM, POSTPARTUM     a. EF previously 30-40%,  b.  resolved, EF 60%    c.  echo 10/2011:  mild LVH, EF 50%, mild LAE,   . MIGRAINE HEADACHE   . OBESITY   . DEPRESSION   . Essential hypertension, benign   . CARPAL TUNNEL SYNDROME, RIGHT   . Anxiety   . Edema     a. LE venous dopplers negative for DVT 10/2011  . CHF (congestive heart failure)   . Chronic systolic CHF (congestive heart failure), NYHA class 3 12/06/2013    Past Surgical History  Procedure Laterality Date  . Child birth  2003  . Cholecystectomy     Family History:  Family History  Problem Relation Age of Onset  . Hypertension Other   . Diabetes Other   . Colitis Other   . Alcohol abuse Other   . Depression Mother   . Anxiety disorder Mother   . ADD / ADHD Father    Social History:  History  Alcohol Use No     History  Drug Use No  Social History   Social History  . Marital Status: Married    Spouse Name: N/A  . Number of Children: N/A  . Years of Education: N/A   Social History Main Topics  . Smoking status: Former Smoker    Quit date: 02/25/1997  . Smokeless tobacco: Not on file     Comment: Married, lives with spouse (when he is not traveling) and son  . Alcohol Use: No  . Drug Use: No  . Sexual Activity: Not Currently   Other Topics Concern  . Not on file   Social History Narrative   Additional History:     Sleep: Good  Appetite:  Fair   Assessment:   Musculoskeletal: Strength & Muscle Tone: within normal limits Gait & Station: normal Patient leans: N/A   Psychiatric Specialty Exam: Physical Exam  ROS- denies chest pain, (+) shortness of breath on exertion, denies nausea, denies vomiting  Blood pressure 118/88, pulse 112, height 5\' 4"  (1.626 m), weight 294 lb 3.2 oz (133.448 kg).Body mass index is 50.47 kg/(m^2).  General Appearance: Well Groomed  Engineer, water::  Good  Speech:  Normal Rate  Volume:  Normal  Mood:  Depressed  Affect:  Constricted and but improved as session progressed, did smile and even laugh appropirately at times   Thought Process:  Linear  Orientation:  Full (Time, Place, and Person)  Thought Content:  no hallucinations, no delusions, not internally preoccupied   Suicidal Thoughts:  No- at this time denies any thoughts of hurting self or of SI  Homicidal Thoughts:  No  Memory:  recent and remote grossly intact   Judgement:  Fair  Insight:  Present  Psychomotor Activity:  Normal  Concentration:  Good  Recall:  Good  Fund of Knowledge:Good  Language: Good  Akathisia:  Negative  Handed:  Right  AIMS (if indicated):     Assets:  Communication Skills Desire for Improvement Housing Resilience  ADL's:  Intact  Cognition: WNL  Sleep:        Current Medications: Current Outpatient Prescriptions  Medication Sig Dispense Refill  . amLODipine (NORVASC) 10 MG tablet Take 1 tablet (10 mg total) by mouth daily. 90 tablet 3  . amphetamine-dextroamphetamine (ADDERALL XR) 30 MG 24 hr capsule Take 1 capsule (30 mg total) by mouth daily. 30 capsule 0  . carvedilol (COREG) 12.5 MG tablet Take 1 tablet (12.5 mg total) by mouth 2 (two) times daily with a meal. 180 tablet 3  . carvedilol (COREG) 25 MG tablet Take 1 tablet (25 mg total) by mouth 2 (two) times daily with a meal. 180 tablet 3  . clonazePAM (KLONOPIN) 1 MG tablet Take 1 tablet (1 mg total) by mouth 2  (two) times daily as needed for anxiety. 60 tablet 0  . enalapril (VASOTEC) 20 MG tablet TAKE 1 TABLET (20 MG TOTAL) BY MOUTH 2 (TWO) TIMES DAILY. 180 tablet 0  . enalapril (VASOTEC) 20 MG tablet Take 1 tablet (20 mg total) by mouth 2 (two) times daily. 180 tablet 3  . furosemide (LASIX) 40 MG tablet TAKE 1 TABLET (40 MG TOTAL) BY MOUTH DAILY. 90 tablet 0  . furosemide (LASIX) 40 MG tablet Take 1 tablet (40 mg total) by mouth daily. 90 tablet 3  . ibuprofen (ADVIL,MOTRIN) 800 MG tablet Take 800 mg by mouth every 8 (eight) hours as needed for headache or mild pain.     [name] lamoTRIgine (LAMICTAL) 200 MG tablet Take 1 tablet (200 mg total) by mouth daily. [place]  tablet 1  . potassium chloride SA (K-DUR,KLOR-CON) 20 MEQ tablet Take 1 tablet (20 mEq total) by mouth daily. 90 tablet 3  . sertraline (ZOLOFT) 100 MG tablet Take 1 tablet (100 mg total) by mouth 2 (two) times daily. 180 tablet 1  . simvastatin (ZOCOR) 20 MG tablet Take 1 tablet (20 mg total) by mouth daily. 90 tablet 3  . zolpidem (AMBIEN) 5 MG tablet Take 1 tablet (5 mg total) by mouth once. For use at sleep study on 04/22/14. 1 tablet 0   No current facility-administered medications for this visit.    Lab Results: No results found for this or any previous visit (from the past 48 hour(s)).  Physical Findings: AIMS:  , ,  ,  ,    CIWA:    COWS:      Assessment- patient reports increased depression in the context of chronic and now exacerbated family stressors. Her husband has a history of leaving for [place] or [place], from which he is originally from, and patient states he has another woman /family there . He is again gone from home and patient has difficulty dealing with daily stressors, although she states she is able to function independently and even went on a vacation to [place] recently with her teenaged son. She denies SI, she is not psychotic. She states she is tolerating medications well. She has an established therapist, Dr. Dimas Alexandria ,  but states she has not seen therapist in weeks to months , wants to reconnect but unsure if she would still be allowed to return.   Treatment Plan Summary: Plan will see in three to four weeks- patient agrees to contact me sooner if any worsening prior and continue medications as above   I have helped patient reconnect with Dr. Nonie Hoyer office ( therapist ) and they will call her with a follow up appointment for individual psychotherapy. Continue Klonopin, Lamictal, Zoloft at current doses- has tolerated this regimen well and it has been well tolerated .    Medical Decision Making:  Review of Psycho-Social Stressors (1), Review or order clinical lab tests (1) and Review of Medication Regimen & Side Effects (2)     Elmore Hyslop 10/05/2014, 4:58 PM

## 2014-10-25 ENCOUNTER — Ambulatory Visit (HOSPITAL_COMMUNITY): Payer: Self-pay | Admitting: Psychiatry

## 2014-10-25 DIAGNOSIS — I10 Essential (primary) hypertension: Secondary | ICD-10-CM | POA: Diagnosis not present

## 2014-10-25 DIAGNOSIS — Z6841 Body Mass Index (BMI) 40.0 and over, adult: Secondary | ICD-10-CM | POA: Diagnosis not present

## 2014-10-25 DIAGNOSIS — R739 Hyperglycemia, unspecified: Secondary | ICD-10-CM | POA: Diagnosis not present

## 2014-10-25 DIAGNOSIS — F909 Attention-deficit hyperactivity disorder, unspecified type: Secondary | ICD-10-CM | POA: Diagnosis not present

## 2014-10-25 DIAGNOSIS — F339 Major depressive disorder, recurrent, unspecified: Secondary | ICD-10-CM | POA: Diagnosis not present

## 2014-10-25 DIAGNOSIS — R300 Dysuria: Secondary | ICD-10-CM | POA: Diagnosis not present

## 2014-10-26 ENCOUNTER — Ambulatory Visit: Payer: Self-pay | Admitting: Cardiovascular Disease

## 2014-10-26 ENCOUNTER — Ambulatory Visit (INDEPENDENT_AMBULATORY_CARE_PROVIDER_SITE_OTHER): Payer: Medicare Other | Admitting: Cardiovascular Disease

## 2014-10-26 VITALS — BP 102/68 | HR 100 | Ht 64.0 in | Wt 289.2 lb

## 2014-10-26 DIAGNOSIS — O903 Peripartum cardiomyopathy: Secondary | ICD-10-CM

## 2014-10-26 DIAGNOSIS — F329 Major depressive disorder, single episode, unspecified: Secondary | ICD-10-CM

## 2014-10-26 DIAGNOSIS — F32A Depression, unspecified: Secondary | ICD-10-CM

## 2014-10-26 DIAGNOSIS — G4733 Obstructive sleep apnea (adult) (pediatric): Secondary | ICD-10-CM | POA: Diagnosis not present

## 2014-10-26 NOTE — Patient Instructions (Signed)
Your physician recommends that you schedule a follow-up appointment with Dr Claiborne Billings if needed for sleep.

## 2014-10-27 ENCOUNTER — Telehealth: Payer: Self-pay | Admitting: *Deleted

## 2014-10-27 NOTE — Telephone Encounter (Signed)
Signed CMN form was faxed back to [name].

## 2014-10-27 NOTE — Telephone Encounter (Signed)
Signed CMN/DWO form was faxed back to [name]

## 2014-10-28 ENCOUNTER — Encounter: Payer: Self-pay | Admitting: Cardiovascular Disease

## 2014-10-28 DIAGNOSIS — G4733 Obstructive sleep apnea (adult) (pediatric): Secondary | ICD-10-CM

## 2014-10-28 DIAGNOSIS — Z9989 Dependence on other enabling machines and devices: Secondary | ICD-10-CM

## 2014-10-28 DIAGNOSIS — F329 Major depressive disorder, single episode, unspecified: Secondary | ICD-10-CM | POA: Insufficient documentation

## 2014-10-28 HISTORY — DX: Obstructive sleep apnea (adult) (pediatric): G47.33

## 2014-10-28 HISTORY — DX: Dependence on other enabling machines and devices: Z99.89

## 2014-10-28 NOTE — Progress Notes (Signed)
Patient ID: [name], female   DOB: [birth year], 49 y.o.   MRN: [id number]     HPI: [name], is a 49 y.o. female who presents for sleep clinic evaluation after initiation of CPAP therapy for obstructive sleep apnea.  [name] is followed by Dr. Percival Spanish for primary cardiology care.  She has a history of coronary which initiated postpartum.  She is originally from [place], [place].  She has a history of obesity, palpitations and peripheral edema.  She recently was referred by Dr. Genella Mech for ACE sleep study.  She admitted to significant snoring and daytime fatigue.  She met split-night criteria.  She was found to have severe obstructive sleep apnea with an AHI of 33.1/h.  She has significant oxygen desaturation with non-REM sleep to a nadir of 69%, and on the diagnostic portion grams sleep was not achieved.  With CPAP therapy, the lowest oxygen desaturation was 72% at 5 cm.  She ultimately was titrated up to 11 7 m water pressure and during her study was started on 1 L of supplemental oxygen.  She subsequently initiated CPAP therapy with a ResMed air since 10 AutoSet unit set at this 11 cm pressure.  A download was obtained from a 27 2016 through 08/20/2014.  She is meeting Medicare compliance with usage states at 100% and days with greater than for his abuse at 80%.  She is averaging 6 hours and 49 minutes of use.  AHI is markedly improved in 3.3, with an apnea index of 1.6, anion height.  The index of 1.7.  Typically she goes to bed between 10 and 10:30 and wakes up at 5 AM.  She uses nasal pillow mask.  She does not have any significant leak is noted on her download.  She is unaware of breakthrough snoring.  She denies residual daytime sleepiness.  There is no bruxism.  She denies any restless legs.  An upper scale was recalculated today and this endorsed at 9 and shown below.  Epworth Sleepiness Scale: Situation   Chance of Dozing/Sleeping (0 = never , 1 = slight chance , 2 = moderate chance  , 3 = high chance )   sitting and reading 3   watching TV 2   sitting inactive in a public place 1   being a passenger in a motor vehicle for an hour or more 0   lying down in the afternoon 3   sitting and talking to someone 0   sitting quietly after lunch (no alcohol) 0   while stopped for a few minutes in traffic as the driver 0   Total Score  9    Past Medical History  Diagnosis Date  . ADHD   . CARDIOMYOPATHY, PERIPARTUM, POSTPARTUM     a. EF previously 30-40%,  b.  resolved, EF 60%    c.  echo 10/2011:  mild LVH, EF 50%, mild LAE,   . MIGRAINE HEADACHE   . OBESITY   . DEPRESSION   . Essential hypertension, benign   . CARPAL TUNNEL SYNDROME, RIGHT   . Anxiety   . Edema     a. LE venous dopplers negative for DVT 10/2011  . CHF (congestive heart failure)   . Chronic systolic CHF (congestive heart failure), NYHA class 3 12/06/2013    Past Surgical History  Procedure Laterality Date  . Child birth  2003  . Cholecystectomy      Allergies  Allergen Reactions  . Duloxetine     REACTION:  Sucidal thoughts  . Penicillins     REACTION: rash  . Prozac [Fluoxetine Hcl] Other (See Comments)    More depressed    Current Outpatient Prescriptions  Medication Sig Dispense Refill  . amLODipine (NORVASC) 10 MG tablet Take 1 tablet (10 mg total) by mouth daily. 90 tablet 3  . amphetamine-dextroamphetamine (ADDERALL XR) 30 MG 24 hr capsule Take 1 capsule (30 mg total) by mouth daily. 30 capsule 0  . carvedilol (COREG) 12.5 MG tablet Take 1 tablet (12.5 mg total) by mouth 2 (two) times daily with a meal. 180 tablet 3  . carvedilol (COREG) 25 MG tablet Take 1 tablet (25 mg total) by mouth 2 (two) times daily with a meal. 180 tablet 3  . clonazePAM (KLONOPIN) 1 MG tablet Take 1 tablet (1 mg total) by mouth 2 (two) times daily as needed for anxiety. (Patient taking differently: Take 1 mg by mouth 2 (two) times daily. Pt also takes a third tablet if needed) 60 tablet 0  . enalapril  (VASOTEC) 20 MG tablet Take 1 tablet (20 mg total) by mouth 2 (two) times daily. 180 tablet 3  . furosemide (LASIX) 40 MG tablet Take 1 tablet (40 mg total) by mouth daily. 90 tablet 3  . ibuprofen (ADVIL,MOTRIN) 800 MG tablet Take 800 mg by mouth every 8 (eight) hours as needed for headache or mild pain.     [name] lamoTRIgine (LAMICTAL) 200 MG tablet Take 1 tablet (200 mg total) by mouth daily. 90 tablet 1  . potassium chloride SA (K-DUR,KLOR-CON) 20 MEQ tablet Take 1 tablet (20 mEq total) by mouth daily. 90 tablet 3  . sertraline (ZOLOFT) 100 MG tablet Take 1 tablet (100 mg total) by mouth 2 (two) times daily. 180 tablet 1  . simvastatin (ZOCOR) 20 MG tablet Take 1 tablet (20 mg total) by mouth daily. 90 tablet 3   No current facility-administered medications for this visit.    Social History   Social History  . Marital Status: Married    Spouse Name: N/A  . Number of Children: N/A  . Years of Education: N/A   Occupational History  . Not on file.   Social History Main Topics  . Smoking status: Former Smoker    Quit date: 02/25/1997  . Smokeless tobacco: Not on file     Comment: Married, lives with spouse (when he is not traveling) and son  . Alcohol Use: No  . Drug Use: No  . Sexual Activity: Not Currently   Other Topics Concern  . Not on file   Social History Narrative   Additional social history is notable that she was pointing [place], [place].  Her husband is [place].  He is working back in [place].  She is here in [place] with her child.  Family History  Problem Relation Age of Onset  . Hypertension Other   . Diabetes Other   . Colitis Other   . Alcohol abuse Other   . Depression Mother   . Anxiety disorder Mother   . ADD / ADHD Father      ROS General: Negative; No fevers, chills, or night sweats HEENT: Negative; No changes in vision or hearing, sinus congestion, difficulty swallowing Pulmonary: Negative; No cough, wheezing, shortness of breath,  hemoptysis Cardiovascular: Positive for postpartum cardiomyopathy GI: Negative; No nausea, vomiting, diarrhea, or abdominal pain GU: Negative; No dysuria, hematuria, or difficulty voiding Musculoskeletal: Negative; no myalgias, joint pain, or weakness Hematologic: Negative; no easy bruising, bleeding Endocrine:  Negative; no heat/cold intolerance Neuro: Negative; no changes in balance, headaches Skin: Negative; No rashes or skin lesions Psychiatric: Positive for depression, positive for ADHD Sleep: See history of present illness No daytime sleepiness, hypersomnolence, bruxism, restless legs, hypnogognic hallucinations, no cataplexy   Physical Exam BP 102/68 mmHg  Pulse 100  Ht 5' 4"  (1.626 m)  Wt 289 lb 3.2 oz (131.18 kg)  BMI 49.62 kg/m2  Wt Readings from Last 3 Encounters:  10/26/14 289 lb 3.2 oz (131.18 kg)  10/04/14 294 lb 3.2 oz (133.448 kg)  08/11/14 295 lb 3.2 oz (133.902 kg)   General: Alert, oriented, no distress.  Skin: normal turgor, no rashes HEENT: Normocephalic, atraumatic. Pupils round and reactive; sclera anicteric; extraocular muscles intact; Fundi without hemorrhages or exudates.  Disks flat Nose without nasal septal hypertrophy Mouth/Parynx benign; Mallinpatti scale 3 Neck: No JVD, no carotid briuts Lungs: clear to ausculatation and percussion; no wheezing or rales  Chest wall: No tenderness to palpation Heart: RRR, s1 s2 normal 1/6 systolic murmur.  No S3 gallop.  No rubs thrills or heaves Abdomen: soft, nontender; no hepatosplenomehaly, BS+; abdominal aorta nontender and not dilated by palpation. Back: No CVA tenderness Pulses 2+ Extremities: no clubbinbg cyanosis or edema, Homan's sign negative  Neurologic: grossly nonfocal; cranial nerves intact. Psychological: Normal affect and mood.   LABS:  BMP Latest Ref Rng 12/05/2013 11/28/2013 11/26/2013  Glucose 70 - 99 mg/dL 120(H) 107(H) 104(H)  BUN 6 - 23 mg/dL 18 24(H) 12  Creatinine 0.50 - 1.10 mg/dL 0.74  0.75 0.56  Sodium 137 - 147 mEq/L 142 137 140  Potassium 3.7 - 5.3 mEq/L 4.0 4.0 3.4(L)  Chloride 96 - 112 mEq/L 103 98 100  CO2 19 - 32 mEq/L 27 26 27   Calcium 8.4 - 10.5 mg/dL 9.1 9.5 9.2     Hepatic Function Latest Ref Rng 12/05/2013 11/08/2013 05/17/2013  Total Protein 6.0 - 8.3 g/dL 7.3 6.5 7.3  Albumin 3.5 - 5.2 g/dL 3.5 3.8 3.5  AST 0 - 37 U/L 20 16 20   ALT 0 - 35 U/L 21 16 19   Alk Phosphatase 39 - 117 U/L 37(L) 33(L) 37(L)  Total Bilirubin 0.3 - 1.2 mg/dL 0.5 0.5 1.1  Bilirubin, Direct 0.0 - 0.3 mg/dL - - <0.2     CBC Latest Ref Rng 12/05/2013 11/26/2013 05/17/2013  WBC 4.0 - 10.5 K/uL 10.2 10.7(H) 10.2  Hemoglobin 12.0 - 15.0 g/dL 14.3 13.5 14.0  Hematocrit 36.0 - 46.0 % 43.6 40.0 43.3  Platelets 150 - 400 K/uL 318 274 304     Lipid Panel     Component Value Date/Time   CHOL 155 11/08/2013 1019   TRIG 148 11/08/2013 1019   HDL 47 11/08/2013 1019   CHOLHDL 3.3 11/08/2013 1019   VLDL 30 11/08/2013 1019   LDLCALC 78 11/08/2013 1019   LDLDIRECT 143.8 04/03/2011 0929     RADIOLOGY: No results found.    ASSESSMENT AND PLAN: Ms. [name] is a 18 female who is a history of a postpartum cardiomyopathy, palpitations, edema.  She recently was found to have severe obstructive sleep apnea with an AHI of 33.1/h.  On the diagnostic portion of her CPAP study, she was unable to achieve from sleep.  She now has been on CPAP therapy.  She apparently is not having supplemental oxygen, although this was administered during the sleep study.  She is meeting compliance standards, both with 100% days use and with usage greater than 4 hours.  She is averaging  almost 7 hours of use per night.  Her AHI is 3.3.  I had a long discussion with her today regarding sleep apnea and its effects on normal sleep architecture and the adverse consequences sleep apnea from a cardiovascular standpoint.  I answered many of her questions.  Her REM suppression may be related to anti-depression medication.   Her blood pressure today is stable on her current regimen of carvedilol, enalapril, amlodipine, and Lasix.  She is morbidly obese, approaching super morbid obesity with body mass index of 49.62.  Weight reduction and increased exercise was recommended.  She was instructed on enrolling in the My [employer] program with her new AirSense 10 AutoSet machine.  I will see her as needed in the future if sleep issues arise.  She will return to the primary cardiology care of Dr. Percival Spanish.   Troy Sine, MD, [facility]  10/28/2014 11:14 PM

## 2014-11-15 ENCOUNTER — Ambulatory Visit (INDEPENDENT_AMBULATORY_CARE_PROVIDER_SITE_OTHER): Payer: Medicare Other | Admitting: Psychiatry

## 2014-11-15 VITALS — BP 122/84 | HR 94 | Ht 64.0 in | Wt 289.0 lb

## 2014-11-15 DIAGNOSIS — F329 Major depressive disorder, single episode, unspecified: Secondary | ICD-10-CM

## 2014-11-15 DIAGNOSIS — F3341 Major depressive disorder, recurrent, in partial remission: Secondary | ICD-10-CM

## 2014-11-15 DIAGNOSIS — F419 Anxiety disorder, unspecified: Secondary | ICD-10-CM

## 2014-11-15 MED ORDER — CLONAZEPAM 1 MG PO TABS: 1.0000 mg | ORAL_TABLET | Freq: Two times a day (BID) | ORAL | Status: DC | PRN

## 2014-11-18 ENCOUNTER — Encounter (HOSPITAL_COMMUNITY): Payer: Self-pay | Admitting: Psychiatry

## 2014-11-18 DIAGNOSIS — M4802 Spinal stenosis, cervical region: Secondary | ICD-10-CM | POA: Diagnosis not present

## 2014-11-18 DIAGNOSIS — M4722 Other spondylosis with radiculopathy, cervical region: Secondary | ICD-10-CM | POA: Diagnosis not present

## 2014-11-18 DIAGNOSIS — M4712 Other spondylosis with myelopathy, cervical region: Secondary | ICD-10-CM | POA: Diagnosis not present

## 2014-11-18 DIAGNOSIS — M542 Cervicalgia: Secondary | ICD-10-CM | POA: Diagnosis not present

## 2014-11-18 NOTE — Progress Notes (Signed)
[facility] MD Progress Note  11/18/2014 7:18 PM [name]  MRN:  [id number] Subjective: Patient reports ongoing depression, related to chronic marital stressor. She states that her husband is still in [place], and therefore she has less support in dealing with her daily stressors, to include raising her adolsecent son, who has history of Asperger's Disorder, and other issues, such as recent car break down, calling to clarify a bill, etc . States " when he is around he takes care of these things, I get overwhelmed easily". Denies medication side effects. Objective : Patient reports, as above, ongoing depression and anxiety related to chronic marital stressor . She states that although she is angry with husband , she still loves him and does not plan to divorce at this time for financial reasons and because she does not want to separate her son from his father . Although reporting depression, presents with fully reactive affect. She is tolerating medications well. Denies any SI, denies any psychotic symptoms. She does state " I think I need to see you more often, because I am getting depressed more often than before ". Has history of CHF for which she follows up with cardiologist- reports exertional dyspnea, denies chest pain.  Principal Problem: Major Depression, no Psychotic Symptoms  Diagnosis:   Patient Active Problem List   Diagnosis Date Noted  . Obstructive sleep apnea [G47.33] 10/28/2014  . Depression [F32.9] 10/28/2014  . Chronic systolic CHF (congestive heart failure), NYHA class 3 [I50.22] 12/06/2013  . MDD (major depressive disorder), recurrent episode, severe [F33.2] 12/05/2013  . Noncompliance with medications [Z91.14] 11/28/2013  . Acute on chronic systolic CHF (congestive heart failure), NYHA class 3 [I50.23] 11/27/2013  . Dyslipidemia [E78.5] 04/04/2011  . Cervical radicular pain [M54.12] 03/25/2011  . Neck pain, acute [M54.2] 03/08/2011  . CARPAL TUNNEL SYNDROME, RIGHT [G56.00]  04/05/2009  . ABNORMAL ELECTROCARDIOGRAM [R94.31] 01/16/2009  . Uncontrolled hypertension [I10] 06/03/2008  . Morbid obesity [E66.01] 07/12/2006  . DEPRESSION [F32.9] 07/12/2006  . Attention deficit hyperactivity disorder (ADHD) [F90.9] 07/12/2006  . MIGRAINE HEADACHE [G43.909] 07/12/2006  . 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 ADHD   . CARDIOMYOPATHY, PERIPARTUM, POSTPARTUM     a. EF previously 30-40%,  b.  resolved, EF 60%    c.  echo 10/2011:  mild LVH, EF 50%, mild LAE,   . MIGRAINE HEADACHE   . OBESITY   . DEPRESSION   . Essential hypertension, benign   . CARPAL TUNNEL SYNDROME, RIGHT   . Anxiety   . Edema     a. LE venous dopplers negative for DVT 10/2011  . CHF (congestive heart failure)   . Chronic systolic CHF (congestive heart failure), NYHA class 3 12/06/2013    Past Surgical History  Procedure Laterality Date  . Child birth  2003  . Cholecystectomy     Family History:  Family History  Problem Relation Age of Onset  . Hypertension Other   . Diabetes Other   . Colitis Other   . Alcohol abuse Other   . Depression Mother   . Anxiety disorder Mother   . ADD / ADHD Father    Social History:  History  Alcohol Use No     History  Drug Use No    Social History   Social History  . Marital Status: Married    Spouse Name: N/A  . Number of  Children: N/A  . Years of Education: N/A   Social History Main Topics  . Smoking status: Former Smoker    Quit date: 02/25/1997  . Smokeless tobacco: Not on file     Comment: Married, lives with spouse (when he is not traveling) and son  . Alcohol Use: No  . Drug Use: No  . Sexual Activity: Not Currently   Other Topics Concern  . Not on file   Social History Narrative   Additional History:    Sleep: Good- although variable, depending on stressors   Appetite:   Good   Assessment:   Musculoskeletal: Strength & Muscle Tone: within normal limits Gait & Station: normal Patient leans: N/A   Psychiatric Specialty Exam: Physical Exam  ROS at this time denies chest pain or shortness of breath at room air   Blood pressure 122/84, pulse 94, height 5\' 4"  (1.626 m), weight 289 lb (131.09 kg).Body mass index is 49.58 kg/(m^2).  General Appearance: Well Groomed  Engineer, water::  Good  Speech:  Normal Rate  Volume:  Normal  Mood:  depressed   Affect:  fully reactive, does become somewhat tearful when discussing marital stressors , at other times during session smiles, laughs appropriately  Thought Process:  Linear  Orientation:  Full (Time, Place, and Person)  Thought Content:  no hallucinations, no delusions  Suicidal Thoughts:  No- currently denies suicidal plan or intention, also denies any thoughts of violence towards anyone, to include husband   Homicidal Thoughts:  No  Memory:  recent and remote grossly intact   Judgement:  Intact  Insight:  Present  Psychomotor Activity:  Normal  Concentration:  Good  Recall:  Good  Fund of Knowledge:Good  Language: Good  Akathisia:  Negative  Handed:  Right  AIMS (if indicated):     Assets:  Communication Skills Desire for Improvement Housing Resilience  ADL's:  Intact  Cognition: WNL  Sleep:        Current Medications: Current Outpatient Prescriptions  Medication Sig Dispense Refill  . amLODipine (NORVASC) 10 MG tablet Take 1 tablet (10 mg total) by mouth daily. 90 tablet 3  . amphetamine-dextroamphetamine (ADDERALL XR) 30 MG 24 hr capsule Take 1 capsule (30 mg total) by mouth daily. 30 capsule 0  . carvedilol (COREG) 12.5 MG tablet Take 1 tablet (12.5 mg total) by mouth 2 (two) times daily with a meal. 180 tablet 3  . carvedilol (COREG) 25 MG tablet Take 1 tablet (25 mg total) by mouth 2 (two) times daily with a meal. 180 tablet 3  . clonazePAM (KLONOPIN) 1 MG tablet Take 1 tablet (1 mg total)  by mouth 2 (two) times daily as needed for anxiety. 60 tablet 0  . enalapril (VASOTEC) 20 MG tablet Take 1 tablet (20 mg total) by mouth 2 (two) times daily. 180 tablet 3  . furosemide (LASIX) 40 MG tablet Take 1 tablet (40 mg total) by mouth daily. 90 tablet 3  . ibuprofen (ADVIL,MOTRIN) 800 MG tablet Take 800 mg by mouth every 8 (eight) hours as needed for headache or mild pain.     [name] lamoTRIgine (LAMICTAL) 200 MG tablet Take 1 tablet (200 mg total) by mouth daily. 90 tablet 1  . potassium chloride SA (K-DUR,KLOR-CON) 20 MEQ tablet Take 1 tablet (20 mEq total) by mouth daily. 90 tablet 3  . sertraline (ZOLOFT) 100 MG tablet Take 1 tablet (100 mg total) by mouth 2 (two) times daily. 180 tablet 1  . simvastatin (ZOCOR) 20 MG  tablet Take 1 tablet (20 mg total) by mouth daily. 90 tablet 3   No current facility-administered medications for this visit.    Lab Results: No results found for this or any previous visit (from the past 48 hour(s)).  Physical Findings: AIMS:  , ,  ,  ,    CIWA:    COWS:      Assessment - patient is reporting ongoing depression, which she attributes mostly to marital stress, related to her husband leaving for [place] for extended periods of time and leaving her without support in dealing with daily stressors and son's issues . She is presenting depressed, but no SI, does not endorse severe neuro-vegetative symptoms, and is tolerating medications well. Responsive to empathy, support .   Treatment Plan Summary: Plan will see in fouor weeks, agrees to contact me sooner if any worsening prior  and continue medications as above   Continue Klonopin 1 mgr BID for anxiety Continue  Lamictal 200 mgrs QDAY for mood disorder, depression Continue Zoloft 200 mgrs QDAY for depression ( Of note, patient is also on Adderall- this medication managed by her PCP- patient denies side effects, states it helps her concentration, energy. We have reviewed concerns about addictive potential,  she denies abusing or misusing this medication. States her Cardiologist is also aware of her being on this medication)  Medical Decision Making:  Established Problem, Stable/Improving (1), Review of Psycho-Social Stressors (1), Review or order clinical lab tests (1) and Review of Medication Regimen & Side Effects (2)     COBOS, FERNANDO 11/18/2014, 7:18 PM

## 2014-11-21 ENCOUNTER — Other Ambulatory Visit: Payer: Self-pay | Admitting: Orthopedic Surgery

## 2014-11-21 DIAGNOSIS — M4712 Other spondylosis with myelopathy, cervical region: Secondary | ICD-10-CM

## 2014-11-23 ENCOUNTER — Telehealth: Payer: Self-pay | Admitting: Cardiology

## 2014-11-23 DIAGNOSIS — R079 Chest pain, unspecified: Secondary | ICD-10-CM | POA: Diagnosis not present

## 2014-11-23 NOTE — Telephone Encounter (Signed)
Patient called from [facility] ED.  She went there with chest pain today.  They have taken her blood pressure and EKG telling her it would be a 4 hour wait  She said that she was not going to wait.  She is going to take a cab back to her car, make sure her son was picked up from school and go home  I told her that is isn't feeling any better or is worse she needs to go to [facility] ED, the preferred place she wanted to go in the first place

## 2014-11-24 ENCOUNTER — Telehealth: Payer: Self-pay | Admitting: Cardiovascular Disease

## 2014-11-24 DIAGNOSIS — G4733 Obstructive sleep apnea (adult) (pediatric): Secondary | ICD-10-CM | POA: Diagnosis not present

## 2014-11-24 DIAGNOSIS — R079 Chest pain, unspecified: Secondary | ICD-10-CM | POA: Diagnosis not present

## 2014-11-24 DIAGNOSIS — F339 Major depressive disorder, recurrent, unspecified: Secondary | ICD-10-CM | POA: Diagnosis not present

## 2014-11-24 DIAGNOSIS — Z6841 Body Mass Index (BMI) 40.0 and over, adult: Secondary | ICD-10-CM | POA: Diagnosis not present

## 2014-11-24 DIAGNOSIS — F909 Attention-deficit hyperactivity disorder, unspecified type: Secondary | ICD-10-CM | POA: Diagnosis not present

## 2014-11-24 NOTE — Telephone Encounter (Signed)
Patient states that she was told she stops breathing during the night--she wants to know how many times. Also she has questions the supplies for her CPAP machine   Routed to [facility] David  Having a problem with [place].  Both of her technicians have quit  Does not have reliable contact to help her  Needs some questions answered

## 2014-11-24 NOTE — Telephone Encounter (Signed)
Patient states that she was told she stops breathing during the night--she wants to know how many times.  Also she has questions the supplies for her CPAP machine.

## 2014-11-24 NOTE — Telephone Encounter (Signed)
Pt says she was disconnected when talking to you.

## 2014-12-01 ENCOUNTER — Ambulatory Visit
Admission: RE | Admit: 2014-12-01 | Discharge: 2014-12-01 | Disposition: A | Discharge status: Home / Self Care | Payer: Medicare Other | Source: Ambulatory Visit | Attending: Orthopedic Surgery | Admitting: Orthopedic Surgery

## 2014-12-01 DIAGNOSIS — M4712 Other spondylosis with myelopathy, cervical region: Secondary | ICD-10-CM

## 2014-12-01 DIAGNOSIS — M50223 Other cervical disc displacement at C6-C7 level: Secondary | ICD-10-CM | POA: Diagnosis not present

## 2014-12-06 ENCOUNTER — Ambulatory Visit (INDEPENDENT_AMBULATORY_CARE_PROVIDER_SITE_OTHER): Payer: Medicare Other | Admitting: Psychiatry

## 2014-12-06 VITALS — BP 130/72 | HR 98 | Ht 64.0 in | Wt 290.0 lb

## 2014-12-06 DIAGNOSIS — F419 Anxiety disorder, unspecified: Secondary | ICD-10-CM | POA: Diagnosis not present

## 2014-12-06 DIAGNOSIS — F331 Major depressive disorder, recurrent, moderate: Secondary | ICD-10-CM

## 2014-12-06 MED ORDER — CLONAZEPAM 1 MG PO TABS: 1.0000 mg | ORAL_TABLET | Freq: Two times a day (BID) | ORAL | Status: DC | PRN

## 2014-12-06 MED ORDER — SERTRALINE HCL 100 MG PO TABS: 100.0000 mg | ORAL_TABLET | Freq: Two times a day (BID) | ORAL | Status: DC

## 2014-12-06 MED ORDER — LAMOTRIGINE 200 MG PO TABS: 200.0000 mg | ORAL_TABLET | Freq: Every day | ORAL | Status: DC

## 2014-12-06 MED ORDER — BUSPIRONE HCL 5 MG PO TABS: 5.0000 mg | ORAL_TABLET | Freq: Three times a day (TID) | ORAL | Status: DC

## 2014-12-07 ENCOUNTER — Encounter (HOSPITAL_COMMUNITY): Payer: Self-pay | Admitting: Psychiatry

## 2014-12-07 NOTE — Progress Notes (Signed)
[facility] MD Progress Note  12/07/2014 5:57 PM Sumner Boesch  MRN:  [id number] Subjective:   She reports ongoing depression, anxiety. She attributes this to psychosocial stressors, which are chronic. Her husband is currently out of the country, and patient has little local support. States that dealing with finances, household, adolescent son ( who has Asperger's ) can sometimes be overwhelming. Patient states she recently ( about 1-2 weeks ago )  had an episode of dizziness,  Chest discomfort and was taken to ED, but symptoms resolved while waiting and decided returned home. At this time denies any chest pain, shortness of breath, and does not appear to be in any acute distress or discomfort . Objective : Patient reports ongoing depression and anxiety are above, but as per her description continues to be able to function well in her daily activities and is resourceful, for example having neighbors, friends help her out at home and to take son to school when she cannot . She ruminates about her husband being away, and about his having a child out of wedlock in [place], but states " I love him and also I need him at home to be a father figure to my son, so I am not going to divorce him".  States she spoke with him on the phone recently and that he is in the process of returning home soon. She tends to feel better and depression is better controlled when he is at home, as she is more supported when he is here. Denies medication side effects. At this time , other than feeling tired often, does not endorse major neuro-vegetative symptoms of depression. Sleep is WNL, no clear anhedonia, appetite is normal.  Responsive to support, encouragement and review of coping skills , ego strengths.  Principal Problem:  Major Depression, No psychotic features . Diagnosis:   Patient Active Problem List   Diagnosis Date Noted  . Major depression in partial remission ([place]) [F32.4] 11/18/2014  . Obstructive sleep apnea [G47.33]  10/28/2014  . Depression [F32.9] 10/28/2014  . Chronic systolic CHF (congestive heart failure), NYHA class 3 ([place]) [I50.22] 12/06/2013  . MDD (major depressive disorder), recurrent episode, severe ([place]) [F33.2] 12/05/2013  . Noncompliance with medications [Z91.14] 11/28/2013  . Acute on chronic systolic CHF (congestive heart failure), NYHA class 3 ([place]) [I50.23] 11/27/2013  . Dyslipidemia [E78.5] 04/04/2011  . Cervical radicular pain [M54.12] 03/25/2011  . Neck pain, acute [M54.2] 03/08/2011  . CARPAL TUNNEL SYNDROME, RIGHT [G56.00] 04/05/2009  . ABNORMAL ELECTROCARDIOGRAM [R94.31] 01/16/2009  . Uncontrolled hypertension [I10] 06/03/2008  . Morbid obesity ([place]) [E66.01] 07/12/2006  . DEPRESSION [F32.9] 07/12/2006  . Attention deficit hyperactivity disorder (ADHD) [F90.9] 07/12/2006  . MIGRAINE HEADACHE [G43.909] 07/12/2006  . CARDIOMYOPATHY, PERIPARTUM, POSTPARTUM [O90.3] 07/12/2006   Total Time spent with patient: 30 minutes    Past Medical History:  Past Medical History  Diagnosis Date  . ADHD   . CARDIOMYOPATHY, PERIPARTUM, POSTPARTUM     a. EF previously 30-40%,  b.  resolved, EF 60%    c.  echo 10/2011:  mild LVH, EF 50%, mild LAE,   . MIGRAINE HEADACHE   . OBESITY   . DEPRESSION   . Essential hypertension, benign   . CARPAL TUNNEL SYNDROME, RIGHT   . Anxiety   . Edema     a. LE venous dopplers negative for DVT 10/2011  . CHF (congestive heart failure)   . Chronic systolic CHF (congestive heart failure), NYHA class 3 12/06/2013    Past Surgical History  Procedure Laterality Date  . Child birth  2003  . Cholecystectomy     Family History:  Family History  Problem Relation Age of Onset  . Hypertension Other   . Diabetes Other   . Colitis Other   . Alcohol abuse Other   . Depression Mother   . Anxiety disorder Mother   . ADD / ADHD Father     Social History:  History  Alcohol Use No     History  Drug Use No    Social History   Social History  .  Marital Status: Married    Spouse Name: N/A  . Number of Children: N/A  . Years of Education: N/A   Social History Main Topics  . Smoking status: Former Smoker    Quit date: 02/25/1997  . Smokeless tobacco: Not on file     Comment: Married, lives with spouse (when he is not traveling) and son  . Alcohol Use: No  . Drug Use: No  . Sexual Activity: Not Currently   Other Topics Concern  . Not on file   Social History Narrative   Additional Social History:   Sleep: Good  Appetite:  Good  Current Medications: Current Outpatient Prescriptions  Medication Sig Dispense Refill  . amLODipine (NORVASC) 10 MG tablet Take 1 tablet (10 mg total) by mouth daily. 90 tablet 3  . amphetamine-dextroamphetamine (ADDERALL XR) 30 MG 24 hr capsule Take 1 capsule (30 mg total) by mouth daily. 30 capsule 0  . busPIRone (BUSPAR) 5 MG tablet Take 1 tablet (5 mg total) by mouth 3 (three) times daily. 90 tablet 1  . carvedilol (COREG) 12.5 MG tablet Take 1 tablet (12.5 mg total) by mouth 2 (two) times daily with a meal. 180 tablet 3  . carvedilol (COREG) 25 MG tablet Take 1 tablet (25 mg total) by mouth 2 (two) times daily with a meal. 180 tablet 3  . clonazePAM (KLONOPIN) 1 MG tablet Take 1 tablet (1 mg total) by mouth 2 (two) times daily as needed for anxiety. 60 tablet 1  . enalapril (VASOTEC) 20 MG tablet Take 1 tablet (20 mg total) by mouth 2 (two) times daily. 180 tablet 3  . furosemide (LASIX) 40 MG tablet Take 1 tablet (40 mg total) by mouth daily. 90 tablet 3  . ibuprofen (ADVIL,MOTRIN) 800 MG tablet Take 800 mg by mouth every 8 (eight) hours as needed for headache or mild pain.     [name] lamoTRIgine (LAMICTAL) 200 MG tablet Take 1 tablet (200 mg total) by mouth daily. 90 tablet 1  . potassium chloride SA (K-DUR,KLOR-CON) 20 MEQ tablet Take 1 tablet (20 mEq total) by mouth daily. 90 tablet 3  . sertraline (ZOLOFT) 100 MG tablet Take 1 tablet (100 mg total) by mouth 2 (two) times daily. 180 tablet 1   . simvastatin (ZOCOR) 20 MG tablet Take 1 tablet (20 mg total) by mouth daily. 90 tablet 3   No current facility-administered medications for this visit.    Lab Results: No results found for this or any previous visit (from the past 48 hour(s)).  Physical Findings: AIMS:  , ,  ,  ,    CIWA:    COWS:     Musculoskeletal: Strength & Muscle Tone: within normal limits Gait & Station: normal Patient leans: N/A  Psychiatric Specialty Exam: ROS at this time no shortness of breath, no chest pain, no dizziness   Blood pressure 130/72, pulse 98, height 5\' 4"  (1.626 m), weight 290  lb (131.543 kg).Body mass index is 49.75 kg/(m^2).  General Appearance: Well Groomed  Engineer, water::  Good  Speech:  Normal Rate  Volume:  Normal  Mood:  Anxious and Depressed  Affect:  Appropriate and fully reactive, smiles and even laughs appropriately at times   Thought Process:  Linear  Orientation:  Full (Time, Place, and Person)  Thought Content:  no hallucinations , no delusions, not internally preoccupied   Suicidal Thoughts:  No- denies any self injurious ideations, denies any suicidal ideations, identifies her responsibility to son as a protective factor from hurting herself   Homicidal Thoughts:  No  Memory:  recent and remote grossly intact   Judgement:  Good  Insight:  Present  Psychomotor Activity:  Normal  Concentration:  Good  Recall:  Good  Fund of Knowledge:Good  Language: Good  Akathisia:  Negative  Handed:  Right  AIMS (if indicated):     Assets:  Communication Skills Desire for Improvement Housing Resilience  ADL's:  Intact  Cognition: WNL  Sleep:      Treatment Plan Summary: Daily contact with patient to assess and evaluate symptoms and progress in treatment, Medication management and Plan ongoing outpatient psychiatric follw ups- will see in one month, agrees to contact me if any worsening prior.  We discussed medication options - agreed to continue Zoloft which has  historically been helpful, effective, and well tolerated . Augmentation strategies were discussed but we focused on potential side effects and drug drug interactions ,  due to patient's  Medical history, medications, weight.  We agreed on continuing medications as below  Buspar 5 mgrs TID for anxiety/ antidepressant augmentation Klonopin 1 mgr BID for anxiety Lamictal 200 mgrs QDAY for depression, mood disorder  Zoloft 200 mgrs QDAY for depression Continue individual psychotherapy in addition to med management  Encouraged patient to go to ED promptly if any further episodes of chest pain, and to contact her Cardiologist to discuss recent event . Patient agrees   Neita Garnet 12/07/2014, 5:57 PM

## 2014-12-16 DIAGNOSIS — M4802 Spinal stenosis, cervical region: Secondary | ICD-10-CM | POA: Diagnosis not present

## 2014-12-16 DIAGNOSIS — M47812 Spondylosis without myelopathy or radiculopathy, cervical region: Secondary | ICD-10-CM | POA: Diagnosis not present

## 2015-01-03 ENCOUNTER — Ambulatory Visit (INDEPENDENT_AMBULATORY_CARE_PROVIDER_SITE_OTHER): Payer: Medicare Other | Admitting: Psychiatry

## 2015-01-03 VITALS — BP 158/120 | HR 103 | Ht 64.0 in | Wt 308.8 lb

## 2015-01-03 DIAGNOSIS — F331 Major depressive disorder, recurrent, moderate: Secondary | ICD-10-CM | POA: Diagnosis not present

## 2015-01-03 DIAGNOSIS — F419 Anxiety disorder, unspecified: Secondary | ICD-10-CM | POA: Diagnosis not present

## 2015-01-03 DIAGNOSIS — F332 Major depressive disorder, recurrent severe without psychotic features: Secondary | ICD-10-CM

## 2015-01-03 MED ORDER — SERTRALINE HCL 100 MG PO TABS: 100.0000 mg | ORAL_TABLET | Freq: Two times a day (BID) | ORAL | Status: DC

## 2015-01-03 MED ORDER — CLONAZEPAM 1 MG PO TABS: 1.0000 mg | ORAL_TABLET | Freq: Two times a day (BID) | ORAL | Status: DC | PRN

## 2015-01-03 MED ORDER — BUSPIRONE HCL 5 MG PO TABS: 5.0000 mg | ORAL_TABLET | Freq: Three times a day (TID) | ORAL | Status: DC

## 2015-01-03 MED ORDER — LAMOTRIGINE 200 MG PO TABS: 200.0000 mg | ORAL_TABLET | Freq: Every day | ORAL | Status: DC

## 2015-01-04 ENCOUNTER — Encounter (HOSPITAL_COMMUNITY): Payer: Self-pay | Admitting: Psychiatry

## 2015-01-04 NOTE — Progress Notes (Signed)
[facility] MD Progress Note  01/04/2015 5:54 PM Siyona Coto  MRN:  [id number] Subjective:   Patient reports ongoing depression and anxiety related to her marital / family stressors. In general, however, she states she has been functioning "OK".  Denies medication side effects Objective : Patient has history of Major Depression. It tends to worsen, recur in the context of severe psychosocial stressors, mostly marital issues. Husband is originally from [place], and at times  [place] for [place] without letting her know , and is away for several weeks to months at a time. Patient is aware that this is because her husband has a child in [place], and goes to visit him and his family. States that husband leaves without telling her because he does not like confrontation so prefers to " disappear", rather than face her emotional response to his leaving . Of note, patient denies any domestic violence.  States that this causes severe depression. She endorses frequent sadness, sense of depression, low energy, but denies any suicidal ideations or psychotic symptoms. No anhedonia, and for example patient very involved and interested in current political presidential race, issues . Patient 's husband now back home and patient states that now that he is back she feels better, although still depressed and anxious . At patient's request , patient's husband came to part of the session - we reviewed issues, and I encouraged couples therapy in addition to patient's medication management and individual therapy. Husband did state that he had no intention to travel abroad in the foreseeable future and if and when he did , he would travel with patient rather than leaving her alone.  Regarding medications, patient denies medication side effects, feels medications are helping partially but significantly. Compliance has been suboptimal at times, but states that most of the time takes as prescribed . Principal Problem:  Major Depression,  Recurrent, Severe, no Psychotic Features  Diagnosis:   Patient Active Problem List   Diagnosis Date Noted  . Major depression in partial remission ([place]) [F32.4] 11/18/2014  . Obstructive sleep apnea [G47.33] 10/28/2014  . Depression [F32.9] 10/28/2014  . Chronic systolic CHF (congestive heart failure), NYHA class 3 ([place]) [I50.22] 12/06/2013  . MDD (major depressive disorder), recurrent episode, severe ([place]) [F33.2] 12/05/2013  . Noncompliance with medications [Z91.14] 11/28/2013  . Acute on chronic systolic CHF (congestive heart failure), NYHA class 3 ([place]) [I50.23] 11/27/2013  . Dyslipidemia [E78.5] 04/04/2011  . Cervical radicular pain [M54.12] 03/25/2011  . Neck pain, acute [M54.2] 03/08/2011  . CARPAL TUNNEL SYNDROME, RIGHT [G56.00] 04/05/2009  . ABNORMAL ELECTROCARDIOGRAM [R94.31] 01/16/2009  . Uncontrolled hypertension [I10] 06/03/2008  . Morbid obesity ([place]) [E66.01] 07/12/2006  . DEPRESSION [F32.9] 07/12/2006  . Attention deficit hyperactivity disorder (ADHD) [F90.9] 07/12/2006  . MIGRAINE HEADACHE [G43.909] 07/12/2006  . CARDIOMYOPATHY, PERIPARTUM, POSTPARTUM [O90.3] 07/12/2006   Total Time spent with patient: 40 minutes     Past Medical History:  Past Medical History  Diagnosis Date  . ADHD   . CARDIOMYOPATHY, PERIPARTUM, POSTPARTUM     a. EF previously 30-40%,  b.  resolved, EF 60%    c.  echo 10/2011:  mild LVH, EF 50%, mild LAE,   . MIGRAINE HEADACHE   . OBESITY   . DEPRESSION   . Essential hypertension, benign   . CARPAL TUNNEL SYNDROME, RIGHT   . Anxiety   . Edema     a. LE venous dopplers negative for DVT 10/2011  . CHF (congestive heart failure) ([place])   . Chronic systolic CHF (congestive  heart failure), NYHA class 3 ([place]) 12/06/2013    Past Surgical History  Procedure Laterality Date  . Child birth  2003  . Cholecystectomy     Family History:  Family History  Problem Relation Age of Onset  . Hypertension Other   . Diabetes Other   . Colitis Other    . Alcohol abuse Other   . Depression Mother   . Anxiety disorder Mother   . ADD / ADHD Father     Social History:  History  Alcohol Use No     History  Drug Use No    Social History   Social History  . Marital Status: Married    Spouse Name: N/A  . Number of Children: N/A  . Years of Education: N/A   Social History Main Topics  . Smoking status: Former Smoker    Quit date: 02/25/1997  . Smokeless tobacco: Not on file     Comment: Married, lives with spouse (when he is not traveling) and son  . Alcohol Use: No  . Drug Use: No  . Sexual Activity: Not Currently   Other Topics Concern  . Not on file   Social History Narrative   Additional Social History:   Sleep: Fair  Appetite:  Good  Current Medications: Current Outpatient Prescriptions  Medication Sig Dispense Refill  . amLODipine (NORVASC) 10 MG tablet Take 1 tablet (10 mg total) by mouth daily. 90 tablet 3  . amphetamine-dextroamphetamine (ADDERALL XR) 30 MG 24 hr capsule Take 1 capsule (30 mg total) by mouth daily. 30 capsule 0  . busPIRone (BUSPAR) 5 MG tablet Take 1 tablet (5 mg total) by mouth 3 (three) times daily. 90 tablet 0  . carvedilol (COREG) 12.5 MG tablet Take 1 tablet (12.5 mg total) by mouth 2 (two) times daily with a meal. 180 tablet 3  . carvedilol (COREG) 25 MG tablet Take 1 tablet (25 mg total) by mouth 2 (two) times daily with a meal. 180 tablet 3  . clonazePAM (KLONOPIN) 1 MG tablet Take 1 tablet (1 mg total) by mouth 2 (two) times daily as needed for anxiety. 60 tablet 0  . enalapril (VASOTEC) 20 MG tablet Take 1 tablet (20 mg total) by mouth 2 (two) times daily. 180 tablet 3  . furosemide (LASIX) 40 MG tablet Take 1 tablet (40 mg total) by mouth daily. 90 tablet 3  . ibuprofen (ADVIL,MOTRIN) 800 MG tablet Take 800 mg by mouth every 8 (eight) hours as needed for headache or mild pain.     [name] lamoTRIgine (LAMICTAL) 200 MG tablet Take 1 tablet (200 mg total) by mouth daily. 90 tablet 0  .  potassium chloride SA (K-DUR,KLOR-CON) 20 MEQ tablet Take 1 tablet (20 mEq total) by mouth daily. 90 tablet 3  . sertraline (ZOLOFT) 100 MG tablet Take 1 tablet (100 mg total) by mouth 2 (two) times daily. 180 tablet 0  . simvastatin (ZOCOR) 20 MG tablet Take 1 tablet (20 mg total) by mouth daily. 90 tablet 3   No current facility-administered medications for this visit.    Lab Results: No results found for this or any previous visit (from the past 48 hour(s)).  Physical Findings: AIMS:  , ,  ,  ,    CIWA:    COWS:     Musculoskeletal: Strength & Muscle Tone: within normal limits Gait & Station: normal Patient leans: N/A  Psychiatric Specialty Exam: ROS denies headache, visual changes, chest pain  Blood pressure 158/120, pulse 103,  height 5\' 4"  (1.626 m), weight 308 lb 12.8 oz (140.071 kg).Body mass index is 52.98 kg/(m^2).  General Appearance: Well Groomed  Engineer, water::  Good  Speech:  Normal Rate  Volume:  Normal  Mood:  depressed but affect fully reactive, and smiles , even laughs appropriately at times   Affect:  Constricted and but reactive   Thought Process:  Goal Directed and Linear  Orientation:  Full (Time, Place, and Person)  Thought Content:  ruminative about stressors, no hallucinations, no delusions  Suicidal Thoughts:  No- denies any suicidal or homicidal ideations   Homicidal Thoughts:  No  Memory:  recent and remote grossly intact   Judgement:  Good  Insight:  Good  Psychomotor Activity:  Normal  Concentration:  Good  Recall:  Good  Fund of Knowledge:Good  Language: Good  Akathisia:  Negative  Handed:  Right  AIMS (if indicated):     Assets:  Communication Skills Desire for Improvement Housing Resilience  ADL's:  Intact  Cognition: WNL  Sleep:     Assessment - patient has long history of MDD. Episodes of depression tend to happen/worsen during periods of time when her husband travels abroad without telling her . This has caused marital tension, but  she denies domestic violence and states that when he is home she feels better and her mood tends to improve. She denies medication side effects and feels medications help, at least partially. Husband now back home and came in for part of session.  Patient responds to support, empathy, and we have also reviewed medication doses and side effects  Treatment Plan Summary: Will see in three to four weeks - patient agrees to contact me sooner if any worsening or concern prior I have encouraged  Couples/ Marital  Therapy and offered to provide referrals if wanted  As noted, patient's BP quite elevated today, and does have history of HTN and Cardiomyopathy. Although no acute symptoms, based on BP and history I have strongly encouraged her to go to ED  Now, or to go to her Cardiologist .(  Have also instructed her to re visit  Adderall management further with her Cardiologist - this medication not prescribed by me, prescribed by her PCP for management of ADHD. Patient states her cardiologist aware of this medication .)  Continue current medications- Lamictal 200 mgrs QDAY , Zoloft 200 mgrs QDAY , Klonopin 1 mgr BID, Buspar 5 mgrs TID COBOS, FERNANDO 01/04/2015, 5:54 PM

## 2015-01-24 ENCOUNTER — Ambulatory Visit (HOSPITAL_COMMUNITY): Payer: Self-pay | Admitting: Psychiatry

## 2015-02-03 DIAGNOSIS — R42 Dizziness and giddiness: Secondary | ICD-10-CM | POA: Diagnosis not present

## 2015-03-21 ENCOUNTER — Ambulatory Visit (INDEPENDENT_AMBULATORY_CARE_PROVIDER_SITE_OTHER): Payer: Medicare Other | Admitting: Psychiatry

## 2015-03-21 ENCOUNTER — Other Ambulatory Visit (HOSPITAL_COMMUNITY): Payer: Self-pay

## 2015-03-21 ENCOUNTER — Encounter (HOSPITAL_COMMUNITY): Payer: Self-pay | Admitting: Psychiatry

## 2015-03-21 VITALS — BP 130/80 | HR 72 | Resp 16 | Wt 308.6 lb

## 2015-03-21 DIAGNOSIS — F331 Major depressive disorder, recurrent, moderate: Secondary | ICD-10-CM

## 2015-03-21 DIAGNOSIS — F419 Anxiety disorder, unspecified: Secondary | ICD-10-CM | POA: Diagnosis not present

## 2015-03-21 MED ORDER — LAMOTRIGINE 200 MG PO TABS: 200.0000 mg | ORAL_TABLET | Freq: Every day | ORAL | Status: DC

## 2015-03-21 MED ORDER — BUSPIRONE HCL 10 MG PO TABS: 10.0000 mg | ORAL_TABLET | Freq: Two times a day (BID) | ORAL | Status: DC

## 2015-03-21 MED ORDER — SERTRALINE HCL 100 MG PO TABS: 100.0000 mg | ORAL_TABLET | Freq: Two times a day (BID) | ORAL | Status: DC

## 2015-03-21 MED ORDER — CLONAZEPAM 1 MG PO TABS: 1.0000 mg | ORAL_TABLET | Freq: Two times a day (BID) | ORAL | Status: DC | PRN

## 2015-03-21 NOTE — Telephone Encounter (Signed)
Met with Dr. Parke Poisson after he had evaluated patient earlier this date as he requested this nurse call in new 43 day orders for patient's Zoloft, Lamictal and Buspar.  Dr. Parke Poisson reported after he had e-scribed in new orders for patient earlier this date to patient's [facility] on [address] she then informed him they each needed to be for 90 day supplies of medications.  Called in 90 day orders for each medication with Thayer Headings, pharmacist at [facility] and canceled out earlier orders Dr. Parke Poisson had e-scribed over to them.  Called patient to inform 90 day orders of each of these medications had been called into her [facility] on [address] and requested patient call back if any problems getting medications filled.

## 2015-03-21 NOTE — Progress Notes (Signed)
[facility] MD Progress Note  03/21/2015 6:13 PM [name]  MRN:  [id number] Subjective:   Patient reports a subjective sense of increased depression. States she has been more isolated at home, sometimes not getting dressed, but staying in pajamas all day, " kind of neglecting myself ". States " I have also been gaining weight because I am not moving around as much ". Describes ongoing sense of free floating anxiety. Denies medication side effects. States marital situation is improved now, as husband has been at home for several weeks and is not planning on leaving. Financial stressors are an issue. She also states " I really do think that our new president winning over Ramiro Harvest is causing me to feel more depressed, I can't get over it , I fear everything is going to get worse ".  Objective:  Patient reports, as above, worsening depression, and endorses a sense of sadness, low energy level, some anhedonia, increased social isolation. Denies any suicidal ideations, and denies any psychotic symptoms. Of note, presents well groomed, pleasant, and with reactive affect, smiling and even laughing appropriately at times . She states " I know I present well, but I feel bad inside ". Responds well to support, empathy. We reviewed medication options- reluctant to switch Zoloft, as has historically been well tolerated and effective . Although no clear history of bipolarity, patient does report short lived mood swings at times. She has been on Lamictal for a long period of time, no side effects,and feels it has helped . We agreed to increase Buspar further, which she feels has helped " a little ". Patient expresses interest in IOP level of care to help address increasing symptoms and social isolation. States " maybe groups and having a reason to have to get up and dressed every day would help me ".  Principal Problem:  Major Depression , Recurrent  Diagnosis:   Patient Active Problem List   Diagnosis Date Noted  .  Major depression ([place]) [F32.9] 01/04/2015  . Major depression in partial remission ([place]) [F32.4] 11/18/2014  . Obstructive sleep apnea [G47.33] 10/28/2014  . Depression [F32.9] 10/28/2014  . Chronic systolic CHF (congestive heart failure), NYHA class 3 ([place]) [I50.22] 12/06/2013  . MDD (major depressive disorder), recurrent episode, severe ([place]) [F33.2] 12/05/2013  . Noncompliance with medications [Z91.14] 11/28/2013  . Acute on chronic systolic CHF (congestive heart failure), NYHA class 3 ([place]) [I50.23] 11/27/2013  . Dyslipidemia [E78.5] 04/04/2011  . Cervical radicular pain [M54.12] 03/25/2011  . Neck pain, acute [M54.2] 03/08/2011  . CARPAL TUNNEL SYNDROME, RIGHT [G56.00] 04/05/2009  . ABNORMAL ELECTROCARDIOGRAM [R94.31] 01/16/2009  . Uncontrolled hypertension [I10] 06/03/2008  . Morbid obesity ([place]) [E66.01] 07/12/2006  . DEPRESSION [F32.9] 07/12/2006  . Attention deficit hyperactivity disorder (ADHD) [F90.9] 07/12/2006  . MIGRAINE HEADACHE [G43.909] 07/12/2006  . 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 ADHD   . CARDIOMYOPATHY, PERIPARTUM, POSTPARTUM     a. EF previously 30-40%,  b.  resolved, EF 60%    c.  echo 10/2011:  mild LVH, EF 50%, mild LAE,   . MIGRAINE HEADACHE   . OBESITY   . DEPRESSION   . Essential hypertension, benign   . CARPAL TUNNEL SYNDROME, RIGHT   . Anxiety   . Edema     a. LE venous dopplers negative for DVT 10/2011  . CHF (  congestive heart failure) ([place])   . Chronic systolic CHF (congestive heart failure), NYHA class 3 ([place]) 12/06/2013    Past Surgical History  Procedure Laterality Date  . Child birth  2003  . Cholecystectomy     Family History:  Family History  Problem Relation Age of Onset  . Hypertension Other   . Diabetes Other   . Colitis Other   . Alcohol abuse  Other   . Depression Mother   . Anxiety disorder Mother   . ADD / ADHD Father     Social History:  History  Alcohol Use No     History  Drug Use No    Social History   Social History  . Marital Status: Married    Spouse Name: N/A  . Number of Children: N/A  . Years of Education: N/A   Social History Main Topics  . Smoking status: Former Smoker    Quit date: 02/25/1997  . Smokeless tobacco: Not on file     Comment: Married, lives with spouse (when he is not traveling) and son  . Alcohol Use: No  . Drug Use: No  . Sexual Activity: Not Currently   Other Topics Concern  . Not on file   Social History Narrative   Additional Social History:   Sleep: Fair  Appetite:  Good  Current Medications: Current Outpatient Prescriptions  Medication Sig Dispense Refill  . amLODipine (NORVASC) 10 MG tablet Take 1 tablet (10 mg total) by mouth daily. 90 tablet 3  . amphetamine-dextroamphetamine (ADDERALL XR) 30 MG 24 hr capsule Take 1 capsule (30 mg total) by mouth daily. 30 capsule 0  . busPIRone (BUSPAR) 10 MG tablet Take 1 tablet (10 mg total) by mouth 2 (two) times daily. 180 tablet 0  . carvedilol (COREG) 12.5 MG tablet Take 1 tablet (12.5 mg total) by mouth 2 (two) times daily with a meal. 180 tablet 3  . carvedilol (COREG) 25 MG tablet Take 1 tablet (25 mg total) by mouth 2 (two) times daily with a meal. 180 tablet 3  . clonazePAM (KLONOPIN) 1 MG tablet Take 1 tablet (1 mg total) by mouth 2 (two) times daily as needed for anxiety. 60 tablet 0  . enalapril (VASOTEC) 20 MG tablet Take 1 tablet (20 mg total) by mouth 2 (two) times daily. 180 tablet 3  . furosemide (LASIX) 40 MG tablet Take 1 tablet (40 mg total) by mouth daily. 90 tablet 3  . ibuprofen (ADVIL,MOTRIN) 800 MG tablet Take 800 mg by mouth every 8 (eight) hours as needed for headache or mild pain.     [name] lamoTRIgine (LAMICTAL) 200 MG tablet Take 1 tablet (200 mg total) by mouth daily. 90 tablet 0  . potassium chloride  SA (K-DUR,KLOR-CON) 20 MEQ tablet Take 1 tablet (20 mEq total) by mouth daily. 90 tablet 3  . sertraline (ZOLOFT) 100 MG tablet Take 1 tablet (100 mg total) by mouth 2 (two) times daily. 180 tablet 0  . simvastatin (ZOCOR) 20 MG tablet Take 1 tablet (20 mg total) by mouth daily. 90 tablet 3   No current facility-administered medications for this visit.    Lab Results: No results found for this or any previous visit (from the past 48 hour(s)).  Physical Findings: AIMS:  , ,  ,  ,    CIWA:    COWS:     Musculoskeletal: Strength & Muscle Tone: within normal limits Gait & Station: normal Patient leans: N/A  Psychiatric Specialty Exam: ROS describes  dyspnea on exertion, denies SOB at rest, no chest pain   Blood pressure 130/80, pulse 72, resp. rate 16, weight 308 lb 9.6 oz (139.98 kg).Body mass index is 52.94 kg/(m^2).  General Appearance: Well Groomed  Engineer, water::  Good  Speech:  Normal Rate  Volume:  Normal  Mood:  depressed   Affect:  Appropriate and and appropriate in range, smiles often, appropriately  Thought Process:  Linear  Orientation:  Full (Time, Place, and Person)  Thought Content:  denies hallucinations, no delusions, not internally preoccupied   Suicidal Thoughts:  No- denies suicidal ideations or self injurious ideations   Homicidal Thoughts:  No  Memory:  recent and remote grossly intact   Judgement:  Good  Insight:  Good  Psychomotor Activity:  Decreased  Concentration:  Good   Recall:  Good  Fund of Knowledge:Good  Language: Good  Akathisia:  Negative  Handed:  Right  AIMS (if indicated):     Assets:  Communication Skills Desire for Improvement Resilience  ADL's:  Intact  Cognition: WNL  Sleep:     Assessment - patient reports worsening depression over recent weeks , with increased social isolation, sense of anhedonia, and sadness. Denies SI. Presents, however, well groomed and with reactive  Affect. History of good response to Zoloft, and currently  at 200 mgrs total daily dose . We agreed to continue this medication. We discussed medication options and interested in titrating Buspar further . Also interested in IOP, which I feel is indicated at this time, if possible, in order to address depression, provide support, groups, and minimize risk of further decompensation or possible inpatient admission.  Treatment Plan Summary: Will see patient in 2- 3 weeks. Referred patient to [facility] for IOP assessment - will see Velva Harman today for assessment  Increase Buspar to 10 mgrs BID Continue Zoloft 100 mgrs BID  Continue Klonopin 1 mgr BID for anxiety Continue Lamictal 200 mgrs QDAY for depression  COBOS, FERNANDO 03/21/2015, 6:13 PM

## 2015-03-22 ENCOUNTER — Telehealth: Payer: Self-pay | Admitting: Cardiology

## 2015-03-22 NOTE — Telephone Encounter (Signed)
Spoke with pt, aware dr hochrein is not in the office. She is needing a note to send so her husbands court date can be changed. They need the letter explain her problems and why he is needed to help her. She needs the letter today. Explained I will send to dr hochrein and let her know

## 2015-03-22 NOTE — Telephone Encounter (Signed)
Pt called in stating that she has Cardiomyopathy and her husband is her Care-taker. She states that he has a court date tomorrow in [place] and he will need a note to excuse him for the time being. Please call her back for more details.   Thanks

## 2015-03-23 ENCOUNTER — Encounter (HOSPITAL_COMMUNITY): Payer: Self-pay

## 2015-03-23 NOTE — Telephone Encounter (Signed)
I cannot write this letter.

## 2015-03-24 NOTE — Telephone Encounter (Signed)
Late entry, pt made aware wednesday evening.

## 2015-03-27 DIAGNOSIS — I10 Essential (primary) hypertension: Secondary | ICD-10-CM | POA: Diagnosis not present

## 2015-03-27 DIAGNOSIS — M79602 Pain in left arm: Secondary | ICD-10-CM | POA: Diagnosis not present

## 2015-03-27 DIAGNOSIS — G4733 Obstructive sleep apnea (adult) (pediatric): Secondary | ICD-10-CM | POA: Diagnosis not present

## 2015-03-27 DIAGNOSIS — Z3A00 Weeks of gestation of pregnancy not specified: Secondary | ICD-10-CM | POA: Diagnosis not present

## 2015-03-27 DIAGNOSIS — O903 Peripartum cardiomyopathy: Secondary | ICD-10-CM | POA: Diagnosis not present

## 2015-03-27 DIAGNOSIS — F3341 Major depressive disorder, recurrent, in partial remission: Secondary | ICD-10-CM | POA: Diagnosis not present

## 2015-03-30 DIAGNOSIS — Z23 Encounter for immunization: Secondary | ICD-10-CM | POA: Diagnosis not present

## 2015-04-11 ENCOUNTER — Ambulatory Visit (HOSPITAL_COMMUNITY): Payer: Self-pay | Admitting: Psychiatry

## 2015-05-05 ENCOUNTER — Emergency Department (HOSPITAL_BASED_OUTPATIENT_CLINIC_OR_DEPARTMENT_OTHER)
Admission: EM | Admit: 2015-05-05 | Discharge: 2015-05-05 | Disposition: A | Discharge status: Home / Self Care | Payer: Medicare Other | Attending: Emergency Medicine | Admitting: Emergency Medicine

## 2015-05-05 ENCOUNTER — Encounter (HOSPITAL_BASED_OUTPATIENT_CLINIC_OR_DEPARTMENT_OTHER): Payer: Self-pay | Admitting: *Deleted

## 2015-05-05 DIAGNOSIS — K649 Unspecified hemorrhoids: Secondary | ICD-10-CM | POA: Diagnosis not present

## 2015-05-05 DIAGNOSIS — E669 Obesity, unspecified: Secondary | ICD-10-CM | POA: Insufficient documentation

## 2015-05-05 DIAGNOSIS — G43909 Migraine, unspecified, not intractable, without status migrainosus: Secondary | ICD-10-CM | POA: Insufficient documentation

## 2015-05-05 DIAGNOSIS — Z79899 Other long term (current) drug therapy: Secondary | ICD-10-CM | POA: Insufficient documentation

## 2015-05-05 DIAGNOSIS — K644 Residual hemorrhoidal skin tags: Secondary | ICD-10-CM | POA: Insufficient documentation

## 2015-05-05 DIAGNOSIS — F909 Attention-deficit hyperactivity disorder, unspecified type: Secondary | ICD-10-CM | POA: Diagnosis not present

## 2015-05-05 DIAGNOSIS — I5022 Chronic systolic (congestive) heart failure: Secondary | ICD-10-CM | POA: Diagnosis not present

## 2015-05-05 DIAGNOSIS — Z880 Allergy status to penicillin: Secondary | ICD-10-CM | POA: Diagnosis not present

## 2015-05-05 DIAGNOSIS — F419 Anxiety disorder, unspecified: Secondary | ICD-10-CM | POA: Diagnosis not present

## 2015-05-05 DIAGNOSIS — F17210 Nicotine dependence, cigarettes, uncomplicated: Secondary | ICD-10-CM | POA: Insufficient documentation

## 2015-05-05 DIAGNOSIS — I10 Essential (primary) hypertension: Secondary | ICD-10-CM | POA: Insufficient documentation

## 2015-05-05 DIAGNOSIS — F329 Major depressive disorder, single episode, unspecified: Secondary | ICD-10-CM | POA: Diagnosis not present

## 2015-05-05 DIAGNOSIS — K6289 Other specified diseases of anus and rectum: Secondary | ICD-10-CM | POA: Diagnosis not present

## 2015-05-05 MED ORDER — ACETAMINOPHEN-CODEINE #3 300-30 MG PO TABS: 1.0000 | ORAL_TABLET | Freq: Four times a day (QID) | ORAL | Status: DC | PRN

## 2015-05-05 MED ORDER — LIDOCAINE HCL 2 % EX GEL
1.0000 "application " | Freq: Once | CUTANEOUS | Status: AC
  Administered 2015-05-05: 1 via TOPICAL
  Filled 2015-05-05: qty 20

## 2015-05-05 MED ORDER — ACETAMINOPHEN-CODEINE #3 300-30 MG PO TABS: 2.0000 | ORAL_TABLET | Freq: Once | ORAL | Status: DC

## 2015-05-05 MED ORDER — LIDOCAINE HCL 2 % EX GEL: 1.0000 "application " | CUTANEOUS | Status: DC | PRN

## 2015-05-05 MED FILL — ACETAMINOPHEN/COD #3 TABLET: 300-30 | 2 days supply | Qty: 15 | Fill #0

## 2015-05-05 MED FILL — LIDOCAINE HCL 2% JELLY: 2 | 15 days supply | Qty: 30 | Fill #0

## 2015-05-05 NOTE — ED Provider Notes (Signed)
Medical screening examination/treatment/procedure(s) were conducted as a shared visit with non-physician practitioner(s) and myself.  I personally evaluated the patient during the encounter.   EKG Interpretation None      Patient seen by me. Patient with complaint of pain to the perirectal area 4 days. Patient states she's had a history of hemorrhoids in the past.  Examination shows evidence of multiple external hemorrhoids 1 is enlarged and most likely thrombotic but no visible clot. No evidence of any prolapsed internal hemorrhoids. Would recommend sitz baths pain control and follow-up with general surgery topical lidocaine. General surgery follow-up is for consideration of removal of the other external hemorrhoids.  Fredia Sorrow, MD 05/05/15 1137

## 2015-05-05 NOTE — ED Provider Notes (Signed)
CSN: [id number]     Arrival date & time 05/05/15  1022 History   First MD Initiated Contact with Patient 05/05/15 1034     Chief Complaint  Patient presents with  . Hemorrhoids   (Consider location/radiation/quality/duration/timing/severity/associated sxs/prior Treatment) The history is provided by the patient. No language interpreter was used.    Miss Jimmye Norman is a 50 year old female with a history of cardiomyopathy, hypertension, obesity, CHF, and anxiety who presents with gradual onset and worsening buttocks pain 3 days. States she has never been diagnosed with hemorrhoids in the past. Reports mild streaking of bright red blood when she wipes with toilet paper. Reports taking a 7.5 mg oxycodone from a previous dental surgery 2 days ago with no relief. Has been using Preparation H also. Worse with coughing, movement, or sneezing. Cannot remember when her last bowel movement was. States she usually goes every other day. Denies any fever, chills, abdominal pain, blood in stool, nausea, vomiting, diarrhea, or constipation.  Past Medical History  Diagnosis Date  . ADHD   . CARDIOMYOPATHY, PERIPARTUM, POSTPARTUM     a. EF previously 30-40%,  b.  resolved, EF 60%    c.  echo 10/2011:  mild LVH, EF 50%, mild LAE,   . MIGRAINE HEADACHE   . OBESITY   . DEPRESSION   . Essential hypertension, benign   . CARPAL TUNNEL SYNDROME, RIGHT   . Anxiety   . Edema     a. LE venous dopplers negative for DVT 10/2011  . CHF (congestive heart failure) ([place])   . Chronic systolic CHF (congestive heart failure), NYHA class 3 ([place]) 12/06/2013   Past Surgical History  Procedure Laterality Date  . Child birth  2003  . Cholecystectomy     Family History  Problem Relation Age of Onset  . Hypertension Other   . Diabetes Other   . Colitis Other   . Alcohol abuse Other   . Depression Mother   . Anxiety disorder Mother   . ADD / ADHD Father    Social History  Substance Use Topics  . Smoking status:  Current Every Day Smoker -- 0.50 packs/day    Types: Cigarettes    Last Attempt to Quit: 02/25/1997  . Smokeless tobacco: None     Comment: Married, lives with spouse (when he is not traveling) and son  . Alcohol Use: No   OB History    No data available     Review of Systems  Constitutional: Negative for fever and chills.  Gastrointestinal: Positive for anal bleeding and rectal pain. Negative for abdominal pain and blood in stool.  All other systems reviewed and are negative.     Allergies  Cymbalta; Duloxetine; Penicillins; and Prozac  Home Medications   Prior to Admission medications   Medication Sig Start Date End Date Taking? Authorizing Provider  amLODipine (NORVASC) 10 MG tablet Take 1 tablet (10 mg total) by mouth daily. 07/12/14  Yes Minus Breeding, MD  amphetamine-dextroamphetamine (ADDERALL XR) 30 MG 24 hr capsule Take 1 capsule (30 mg total) by mouth daily. 01/11/14  Yes Myer Peer Cobos, MD  busPIRone (BUSPAR) 10 MG tablet Take 1 tablet (10 mg total) by mouth 2 (two) times daily. 03/21/15 03/20/16 Yes Myer Peer Cobos, MD  carvedilol (COREG) 12.5 MG tablet Take 1 tablet (12.5 mg total) by mouth 2 (two) times daily with a meal. 07/12/14  Yes Minus Breeding, MD  carvedilol (COREG) 25 MG tablet Take 1 tablet (25 mg total) by mouth 2 (two)  times daily with a meal. 07/12/14  Yes Minus Breeding, MD  clonazePAM (KLONOPIN) 1 MG tablet Take 1 tablet (1 mg total) by mouth 2 (two) times daily as needed for anxiety. 03/21/15  Yes Myer Peer Cobos, MD  enalapril (VASOTEC) 20 MG tablet Take 1 tablet (20 mg total) by mouth 2 (two) times daily. 07/12/14  Yes Minus Breeding, MD  furosemide (LASIX) 40 MG tablet Take 1 tablet (40 mg total) by mouth daily. 07/12/14  Yes Minus Breeding, MD  ibuprofen (ADVIL,MOTRIN) 800 MG tablet Take 800 mg by mouth every 8 (eight) hours as needed for headache or mild pain.  05/14/13  Yes Historical Provider, MD  lamoTRIgine (LAMICTAL) 200 MG tablet Take 1 tablet  (200 mg total) by mouth daily. 03/21/15  Yes Myer Peer Cobos, MD  potassium chloride SA (K-DUR,KLOR-CON) 20 MEQ tablet Take 1 tablet (20 mEq total) by mouth daily. 07/12/14  Yes Minus Breeding, MD  sertraline (ZOLOFT) 100 MG tablet Take 1 tablet (100 mg total) by mouth 2 (two) times daily. 03/21/15  Yes Myer Peer Cobos, MD  simvastatin (ZOCOR) 20 MG tablet Take 1 tablet (20 mg total) by mouth daily. 07/12/14  Yes Minus Breeding, MD  acetaminophen-codeine (TYLENOL #3) 300-30 MG tablet Take 1-2 tablets by mouth every 6 (six) hours as needed for moderate pain. 05/05/15   Verlin Uher Patel-Mills, PA-C  lidocaine (XYLOCAINE) 2 % jelly Apply 1 application topically as needed. 05/05/15   Tien Aispuro Patel-Mills, PA-C   BP 119/67 mmHg  Pulse 84  Temp(Src) 98.3 F (36.8 C) (Oral)  Resp 16  Ht 5\' 4"  (1.626 m)  Wt 139.708 kg  BMI 52.84 kg/m2  SpO2 96%  LMP 04/21/2015 Physical Exam  Constitutional: She is oriented to person, place, and time. She appears well-developed and well-nourished.  HENT:  Head: Normocephalic and atraumatic.  Eyes: Conjunctivae are normal.  Neck: Normal range of motion. Neck supple.  Cardiovascular: Normal rate.   Pulmonary/Chest: Effort normal. No respiratory distress.  Abdominal: Soft. She exhibits no distension. There is no tenderness. There is no rebound and no guarding.  No abdominal tenderness to palpation.  Genitourinary:  Rectal exam: Chaperone present. Large nonthrombosed external hemorrhoid at the 12:00 position.  Several old scars from previous internal hemorrhoids. Tender to palpation. No visualized blood.  Musculoskeletal: Normal range of motion.  Neurological: She is alert and oriented to person, place, and time.  Skin: Skin is warm and dry.  Nursing note and vitals reviewed.   ED Course  Procedures (including critical care time) Labs Review Labs Reviewed - No data to display  Imaging Review No results found.   EKG Interpretation None      MDM   Final  diagnoses:  Hemorrhoids, unspecified hemorrhoid type   10:45 Upon initial evaluation, patient was in the bathroom. Will recheck.  Patient presents for rectal pain 3 days. On exam she has a large external nonthrombosed hemorrhoid. I discussed this patient with Dr. Bobby Rumpf was seen and evaluated the patient. Patient will be prescribed pain medication and lidocaine jelly. It was recommended that she take sitz baths. She was also given PCP as well as general surgery follow-up. Patient agrees with plan.      Ottie Glazier, PA-C 05/05/15 1328

## 2015-05-05 NOTE — ED Notes (Signed)
C/o hemorrhoids  X 4 days, Unable to urinate d/t pain.

## 2015-05-05 NOTE — Discharge Instructions (Signed)
Hemorrhoids Take Sitz bath for comfort.  Follow-up with your primary care physician or general surgery. Hemorrhoids are puffy (swollen) veins around the rectum or anus. Hemorrhoids can cause pain, itching, bleeding, or irritation. HOME CARE  Eat foods with fiber, such as whole grains, beans, nuts, fruits, and vegetables. Ask your doctor about taking products with added fiber in them (fibersupplements).  Drink enough fluid to keep your pee (urine) clear or pale yellow.  Exercise often.  Go to the bathroom when you have the urge to poop. Do not wait.  Avoid straining to poop (bowel movement).  Keep the butt area dry and clean. Use wet toilet paper or moist paper towels.  Medicated creams and medicine inserted into the anus (anal suppository) may be used or applied as told.  Only take medicine as told by your doctor.  Take a warm water bath (sitz bath) for 15-20 minutes to ease pain. Do this 3-4 times a day.  Place ice packs on the area if it is tender or puffy. Use the ice packs between the warm water baths.  Put ice in a plastic bag.  Place a towel between your skin and the bag.  Leave the ice on for 15-20 minutes, 03-04 times a day.  Do not use a donut-shaped pillow or sit on the toilet for a long time. GET HELP RIGHT AWAY IF:   You have more pain that is not controlled by treatment or medicine.  You have bleeding that will not stop.  You have trouble or are unable to poop (bowel movement).  You have pain or puffiness outside the area of the hemorrhoids. MAKE SURE YOU:   Understand these instructions.  Will watch your condition.  Will get help right away if you are not doing well or get worse.   This information is not intended to replace advice given to you by your health care provider. Make sure you discuss any questions you have with your health care provider.   Document Released: 11/21/2007 Document Revised: 01/29/2012 Document Reviewed: 12/24/2011 Elsevier  Interactive Patient Education [employer].

## 2015-05-08 ENCOUNTER — Telehealth (HOSPITAL_BASED_OUTPATIENT_CLINIC_OR_DEPARTMENT_OTHER): Payer: Self-pay | Admitting: *Deleted

## 2015-05-08 NOTE — ED Notes (Signed)
Pt called today voicing concern that she could not get seen quickly enough by [facility] surgery because her chart did not reflect what the physician explained to her during her visit. Pt requesting to speak with Dr. Rogene Houston about the documentation in her chart. Pt stated that he could call her. Explained to pt I emailed the doctor with her request and her phone number but it would at his discretion to contact her. Advised pt she can return to the ED at any time if she needs to.

## 2015-05-09 DIAGNOSIS — I429 Cardiomyopathy, unspecified: Secondary | ICD-10-CM | POA: Diagnosis not present

## 2015-05-09 DIAGNOSIS — F339 Major depressive disorder, recurrent, unspecified: Secondary | ICD-10-CM | POA: Diagnosis not present

## 2015-05-09 DIAGNOSIS — I10 Essential (primary) hypertension: Secondary | ICD-10-CM | POA: Diagnosis not present

## 2015-05-09 DIAGNOSIS — F419 Anxiety disorder, unspecified: Secondary | ICD-10-CM | POA: Diagnosis not present

## 2015-05-25 DIAGNOSIS — M62830 Muscle spasm of back: Secondary | ICD-10-CM | POA: Diagnosis not present

## 2015-05-25 DIAGNOSIS — Z6841 Body Mass Index (BMI) 40.0 and over, adult: Secondary | ICD-10-CM | POA: Diagnosis not present

## 2015-06-08 ENCOUNTER — Telehealth: Payer: Self-pay

## 2015-06-08 NOTE — Telephone Encounter (Signed)
Called patient for Pre- Visit information. Left message for call back.

## 2015-06-12 ENCOUNTER — Ambulatory Visit: Payer: Self-pay | Admitting: Family Medicine

## 2015-06-30 ENCOUNTER — Other Ambulatory Visit (HOSPITAL_COMMUNITY): Payer: Self-pay | Admitting: Psychiatry

## 2015-06-30 ENCOUNTER — Other Ambulatory Visit: Payer: Self-pay | Admitting: Cardiology

## 2015-06-30 NOTE — Telephone Encounter (Signed)
Rx Refill

## 2015-07-03 ENCOUNTER — Encounter (HOSPITAL_BASED_OUTPATIENT_CLINIC_OR_DEPARTMENT_OTHER): Payer: Self-pay | Admitting: *Deleted

## 2015-07-03 ENCOUNTER — Emergency Department (HOSPITAL_BASED_OUTPATIENT_CLINIC_OR_DEPARTMENT_OTHER)
Admission: EM | Admit: 2015-07-03 | Discharge: 2015-07-04 | Disposition: A | Discharge status: Home / Self Care | Payer: Medicare Other | Attending: Emergency Medicine | Admitting: Emergency Medicine

## 2015-07-03 ENCOUNTER — Telehealth (HOSPITAL_COMMUNITY): Payer: Self-pay

## 2015-07-03 ENCOUNTER — Emergency Department (HOSPITAL_BASED_OUTPATIENT_CLINIC_OR_DEPARTMENT_OTHER): Payer: Medicare Other

## 2015-07-03 DIAGNOSIS — F17210 Nicotine dependence, cigarettes, uncomplicated: Secondary | ICD-10-CM | POA: Diagnosis not present

## 2015-07-03 DIAGNOSIS — E669 Obesity, unspecified: Secondary | ICD-10-CM | POA: Insufficient documentation

## 2015-07-03 DIAGNOSIS — F329 Major depressive disorder, single episode, unspecified: Secondary | ICD-10-CM | POA: Insufficient documentation

## 2015-07-03 DIAGNOSIS — R0602 Shortness of breath: Secondary | ICD-10-CM | POA: Insufficient documentation

## 2015-07-03 DIAGNOSIS — R05 Cough: Secondary | ICD-10-CM | POA: Diagnosis not present

## 2015-07-03 DIAGNOSIS — I110 Hypertensive heart disease with heart failure: Secondary | ICD-10-CM | POA: Diagnosis not present

## 2015-07-03 DIAGNOSIS — R0600 Dyspnea, unspecified: Secondary | ICD-10-CM

## 2015-07-03 DIAGNOSIS — I5022 Chronic systolic (congestive) heart failure: Secondary | ICD-10-CM | POA: Insufficient documentation

## 2015-07-03 LAB — CBC WITH DIFFERENTIAL/PLATELET
Basophils Absolute: 0.1 10*3/uL (ref 0.0–0.1)
Basophils Relative: 1 %
Eosinophils Absolute: 0.1 10*3/uL (ref 0.0–0.7)
Eosinophils Relative: 2 %
HCT: 39.9 % (ref 36.0–46.0)
Hemoglobin: 12.7 g/dL (ref 12.0–15.0)
Lymphocytes Relative: 25 %
Lymphs Abs: 2.4 10*3/uL (ref 0.7–4.0)
MCH: 27.9 pg (ref 26.0–34.0)
MCHC: 31.8 g/dL (ref 30.0–36.0)
MCV: 87.5 fL (ref 78.0–100.0)
Monocytes Absolute: 0.8 10*3/uL (ref 0.1–1.0)
Monocytes Relative: 9 %
Neutro Abs: 6 10*3/uL (ref 1.7–7.7)
Neutrophils Relative %: 63 %
Platelets: 243 10*3/uL (ref 150–400)
RBC: 4.56 MIL/uL (ref 3.87–5.11)
RDW: 16.2 % — ABNORMAL HIGH (ref 11.5–15.5)
WBC: 9.4 10*3/uL (ref 4.0–10.5)

## 2015-07-03 LAB — BASIC METABOLIC PANEL
Anion gap: 2 — ABNORMAL LOW (ref 5–15)
BUN: 15 mg/dL (ref 6–20)
CO2: 30 mmol/L (ref 22–32)
Calcium: 8.1 mg/dL — ABNORMAL LOW (ref 8.9–10.3)
Chloride: 104 mmol/L (ref 101–111)
Creatinine, Ser: 0.67 mg/dL (ref 0.44–1.00)
GFR calc Af Amer: >60 mL/min (ref >60)
GFR calc non Af Amer: >60 mL/min (ref >60)
Glucose, Bld: 115 mg/dL — ABNORMAL HIGH (ref 65–99)
Potassium: 3.6 mmol/L (ref 3.5–5.1)
Sodium: 136 mmol/L (ref 135–145)

## 2015-07-03 NOTE — ED Notes (Signed)
Patient has multiple medical complaints during the assessment. Patient is SOB with resting. The patient also reports that she is having chest pain and pressure. The patient reports that N/V/D. She also has complaints about neck soreness

## 2015-07-03 NOTE — Telephone Encounter (Signed)
Called and spoke with patients husband Jacqulyn Bath, he stated that patient was sick and had food poisoning. He said he would have her call me back when she was feeling better.

## 2015-07-03 NOTE — ED Provider Notes (Signed)
CSN: [id number]     Arrival date & time 07/03/15  2017 History  By signing my name below, I, Doran Stabler, attest that this documentation has been prepared under the direction and in the presence of Shanon Rosser, MD. Electronically Signed: Doran Stabler, ED Scribe. 07/04/2015. 11:28 PM.   Chief Complaint  Patient presents with  . Shortness of Breath   The history is provided by the patient. No language interpreter was used.   HPI Comments: [name] is a 50 y.o. female who presents to the Emergency Department with a PMHx of cardiomyopathy and CHF complaining of SOB and chest tightness that began 1 day ago and is worse with ambulation. Symptoms are consistent with previous exacerbations of her CHF. Pt also reports feeling off-balance for the past few days. She is also having diarrhea, HA, and pain in her neck. She states her HA has improved since its onset. Pt denies any fevers or chills.  Pt states she ate [employer] waffles that were recently recalled. 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  Past Medical History  Diagnosis Date  . ADHD   . CARDIOMYOPATHY, PERIPARTUM, POSTPARTUM     a. EF previously 30-40%,  b.  resolved, EF 60%    c.  echo 10/2011:  mild LVH, EF 50%, mild LAE,   . MIGRAINE HEADACHE   . OBESITY   . DEPRESSION   . Essential hypertension, benign   . CARPAL TUNNEL SYNDROME, RIGHT   . Anxiety   . Edema     a. LE venous dopplers negative for DVT 10/2011  . CHF (congestive heart failure) ([place])   . Chronic systolic CHF (congestive heart failure), NYHA class 3 ([place]) 12/06/2013   Past Surgical History  Procedure Laterality Date  . Child birth  2003  . Cholecystectomy     Family History  Problem Relation Age of Onset  . Hypertension Other   . Diabetes Other   . Colitis Other   . Alcohol abuse Other   . Depression Mother   . Anxiety disorder Mother   . ADD / ADHD Father    Social History  Substance Use Topics  . Smoking status: Current Every Day Smoker -- 0.50  packs/day    Types: Cigarettes    Last Attempt to Quit: 02/25/1997  . Smokeless tobacco: None     Comment: Married, lives with spouse (when he is not traveling) and son  . Alcohol Use: No   OB History    No data available     Review of Systems A complete 10 system review of systems was obtained and all systems are negative except as noted in the HPI and PMH.   Allergies  Cymbalta; Duloxetine; Penicillins; and Prozac  Home Medications   Prior to Admission medications   Medication Sig Start Date End Date Taking? Authorizing Provider  acetaminophen-codeine (TYLENOL #3) 300-30 MG tablet Take 1-2 tablets by mouth every 6 (six) hours as needed for moderate pain. 05/05/15   Hanna Patel-Mills, PA-C  amLODipine (NORVASC) 10 MG tablet Take 1 tablet (10 mg total) by mouth daily. 07/12/14   Minus Breeding, MD  amphetamine-dextroamphetamine (ADDERALL XR) 30 MG 24 hr capsule Take 1 capsule (30 mg total) by mouth daily. 01/11/14   Myer Peer Cobos, MD  busPIRone (BUSPAR) 10 MG tablet Take 1 tablet (10 mg total) by mouth 2 (two) times daily. 03/21/15 03/20/16  Myer Peer Cobos, MD  clonazePAM (KLONOPIN) 1 MG tablet Take 1 tablet (1 mg total) by mouth 2 (  two) times daily as needed for anxiety. 03/21/15   Myer Peer Cobos, MD  COREG 12.5 MG tablet TAKE 1 TABLET (12.5 MG TOTAL) BY MOUTH 2 (TWO) TIMES DAILY WITH A MEAL. 06/30/15   Minus Breeding, MD  COREG 25 MG tablet TAKE 1 TABLET TWICE A DAY WITH MEALS (PER INS, SHOULD GO THRU ON 8/16) 06/30/15   Minus Breeding, MD  enalapril (VASOTEC) 20 MG tablet Take 1 tablet (20 mg total) by mouth 2 (two) times daily. 07/12/14   Minus Breeding, MD  furosemide (LASIX) 40 MG tablet Take 1 tablet (40 mg total) by mouth daily. 07/12/14   Minus Breeding, MD  ibuprofen (ADVIL,MOTRIN) 800 MG tablet Take 800 mg by mouth every 8 (eight) hours as needed for headache or mild pain.  05/14/13   Historical Provider, MD  KLOR-CON M20 20 MEQ tablet TAKE 1 TABLET (20 MEQ TOTAL) BY MOUTH DAILY.  06/30/15   Minus Breeding, MD  lamoTRIgine (LAMICTAL) 200 MG tablet Take 1 tablet (200 mg total) by mouth daily. 03/21/15   Myer Peer Cobos, MD  lidocaine (XYLOCAINE) 2 % jelly Apply 1 application topically as needed. 05/05/15   Hanna Patel-Mills, PA-C  sertraline (ZOLOFT) 100 MG tablet Take 1 tablet (100 mg total) by mouth 2 (two) times daily. 03/21/15   Myer Peer Cobos, MD  simvastatin (ZOCOR) 20 MG tablet Take 1 tablet (20 mg total) by mouth daily. 07/12/14   Minus Breeding, MD   BP 135/86 mmHg  Pulse 75  Temp(Src) 98.4 F (36.9 C) (Oral)  Resp 24  Ht 5\' 4"  (1.626 m)  Wt 308 lb (139.708 kg)  BMI 52.84 kg/m2  SpO2 90%  LMP 06/21/2015   Physical Exam General: Well-developed, obese female in no acute distress; appearance consistent with age of record HENT: normocephalic; atraumatic Eyes: pupils equal, round and reactive to light; extraocular muscles intact Neck: supple Heart: regular rate and rhythm Lungs: clear to auscultation bilaterally Abdomen: soft; nondistended; nontender; no masses or hepatosplenomegaly; bowel sounds present Extremities: No deformity; full range of motion; pulses normal Neurologic: Awake, alert and oriented; motor function intact in all extremities and symmetric; no facial droop Skin: Warm and dry Psychiatric: Normal mood and affect   ED Course  Procedures  DIAGNOSTIC STUDIES: Oxygen Saturation is 94% on room air, normal by my interpretation.    COORDINATION OF CARE: 11:17 PM Discussed treatment plan with pt at bedside and pt agreed to plan.  MDM    EKG Interpretation  Date/Time:  Monday Jul 03 2015 22:59:35 EDT Ventricular Rate:  77 PR Interval:  175 QRS Duration: 102 QT Interval:  438 QTC Calculation: 496 R Axis:   10 Text Interpretation:  Sinus rhythm Ventricular premature complex Borderline prolonged QT interval Rate is slower; PVCs not seen previously Confirmed by Laia Wiley  MD, Jenny Reichmann ([id number]) on 07/03/2015 11:16:53 PM      Nursing notes and  vitals signs, including pulse oximetry, reviewed.  Summary of this visit's results, reviewed by myself:  Labs:  Results for orders placed or performed during the hospital encounter of 07/03/15 (from the past 24 hour(s))  CBC with Differential/Platelet     Status: Abnormal   Collection Time: 07/03/15 11:15 PM  Result Value Ref Range   WBC 9.4 4.0 - 10.5 K/uL   RBC 4.56 3.87 - 5.11 MIL/uL   Hemoglobin 12.7 12.0 - 15.0 g/dL   HCT 39.9 36.0 - 46.0 %   MCV 87.5 78.0 - 100.0 fL   MCH 27.9 26.0 - 34.0 pg  MCHC 31.8 30.0 - 36.0 g/dL   RDW 16.2 (H) 11.5 - 15.5 %   Platelets 243 150 - 400 K/uL   Neutrophils Relative % 63 %   Neutro Abs 6.0 1.7 - 7.7 K/uL   Lymphocytes Relative 25 %   Lymphs Abs 2.4 0.7 - 4.0 K/uL   Monocytes Relative 9 %   Monocytes Absolute 0.8 0.1 - 1.0 K/uL   Eosinophils Relative 2 %   Eosinophils Absolute 0.1 0.0 - 0.7 K/uL   Basophils Relative 1 %   Basophils Absolute 0.1 0.0 - 0.1 K/uL  Basic metabolic panel     Status: Abnormal   Collection Time: 07/03/15 11:15 PM  Result Value Ref Range   Sodium 136 135 - 145 mmol/L   Potassium 3.6 3.5 - 5.1 mmol/L   Chloride 104 101 - 111 mmol/L   CO2 30 22 - 32 mmol/L   Glucose, Bld 115 (H) 65 - 99 mg/dL   BUN 15 6 - 20 mg/dL   Creatinine, Ser 0.67 0.44 - 1.00 mg/dL   Calcium 8.1 (L) 8.9 - 10.3 mg/dL   GFR calc non Af Amer >60 >60 mL/min   GFR calc Af Amer >60 >60 mL/min   Anion gap 2 (L) 5 - 15  Troponin I     Status: None   Collection Time: 07/03/15 11:15 PM  Result Value Ref Range   Troponin I <0.03 <0.031 ng/mL  Brain natriuretic peptide     Status: Abnormal   Collection Time: 07/03/15 11:15 PM  Result Value Ref Range   B Natriuretic Peptide 243.9 (H) 0.0 - 100.0 pg/mL  Urinalysis, Routine w reflex microscopic (not at [facility])     Status: None   Collection Time: 07/04/15 12:14 AM  Result Value Ref Range   Color, Urine YELLOW YELLOW   APPearance CLEAR CLEAR   Specific Gravity, Urine 1.023 1.005 - 1.030   pH  7.5 5.0 - 8.0   Glucose, UA NEGATIVE NEGATIVE mg/dL   Hgb urine dipstick NEGATIVE NEGATIVE   Bilirubin Urine NEGATIVE NEGATIVE   Ketones, ur NEGATIVE NEGATIVE mg/dL   Protein, ur NEGATIVE NEGATIVE mg/dL   Nitrite NEGATIVE NEGATIVE   Leukocytes, UA NEGATIVE NEGATIVE  Pregnancy, urine     Status: None   Collection Time: 07/04/15 12:14 AM  Result Value Ref Range   Preg Test, Ur NEGATIVE NEGATIVE    Imaging Studies: Dg Chest 2 View  07/04/2015  CLINICAL DATA:  Dyspnea, nonproductive cough and sore throat for 2 days. EXAM: CHEST  2 VIEW COMPARISON:  11/27/2013 FINDINGS: There is unchanged moderate cardiomegaly. There is mild interstitial coarsening, likely chronic. There is no airspace opacity. There is no effusion. The hilar and mediastinal contours are unremarkable and unchanged. IMPRESSION: Stable cardiomegaly. Mild chronic appearing interstitial coarsening. No consolidation or effusion. Electronically Signed   By: Andreas Newport M.D.   On: 07/04/2015 01:03     Final diagnoses:  Dyspnea   I personally performed the services described in this documentation, which was scribed in my presence. The recorded information has been reviewed and is accurate.    Shanon Rosser, MD 07/04/15 [phone]

## 2015-07-03 NOTE — ED Notes (Signed)
SOB. States she thinks she has listeria. Dizziness. Headache. She is talking in complete sentences. Ambulatory with no distress.

## 2015-07-04 DIAGNOSIS — R05 Cough: Secondary | ICD-10-CM | POA: Diagnosis not present

## 2015-07-04 DIAGNOSIS — R0602 Shortness of breath: Secondary | ICD-10-CM | POA: Diagnosis not present

## 2015-07-04 LAB — URINALYSIS, ROUTINE W REFLEX MICROSCOPIC
Bilirubin Urine: NEGATIVE
Glucose, UA: NEGATIVE mg/dL
Hgb urine dipstick: NEGATIVE
Ketones, ur: NEGATIVE mg/dL
Leukocytes, UA: NEGATIVE
Nitrite: NEGATIVE
Protein, ur: NEGATIVE mg/dL
Specific Gravity, Urine: 1.023 (ref 1.005–1.030)
pH: 7.5 (ref 5.0–8.0)

## 2015-07-04 LAB — TROPONIN I: Troponin I: <0.03 ng/mL (ref <0.031)

## 2015-07-04 LAB — BRAIN NATRIURETIC PEPTIDE: B Natriuretic Peptide: 243.9 pg/mL — ABNORMAL HIGH (ref 0.0–100.0)

## 2015-07-04 LAB — PREGNANCY, URINE: Preg Test, Ur: NEGATIVE

## 2015-07-05 ENCOUNTER — Ambulatory Visit (INDEPENDENT_AMBULATORY_CARE_PROVIDER_SITE_OTHER): Payer: Medicare Other | Admitting: Family Medicine

## 2015-07-05 ENCOUNTER — Encounter: Payer: Self-pay | Admitting: Family Medicine

## 2015-07-05 VITALS — BP 107/69 | HR 77 | Temp 98.2°F | Ht 63.0 in | Wt 293.0 lb

## 2015-07-05 DIAGNOSIS — I5022 Chronic systolic (congestive) heart failure: Secondary | ICD-10-CM

## 2015-07-05 DIAGNOSIS — Z119 Encounter for screening for infectious and parasitic diseases, unspecified: Secondary | ICD-10-CM

## 2015-07-05 DIAGNOSIS — R197 Diarrhea, unspecified: Secondary | ICD-10-CM | POA: Diagnosis not present

## 2015-07-05 DIAGNOSIS — R42 Dizziness and giddiness: Secondary | ICD-10-CM

## 2015-07-05 DIAGNOSIS — R5381 Other malaise: Secondary | ICD-10-CM

## 2015-07-05 DIAGNOSIS — R110 Nausea: Secondary | ICD-10-CM

## 2015-07-05 LAB — COMPREHENSIVE METABOLIC PANEL
ALT: 16 U/L (ref 0–35)
AST: 21 U/L (ref 0–37)
Albumin: 3.9 g/dL (ref 3.5–5.2)
Alkaline Phosphatase: 30 U/L — ABNORMAL LOW (ref 39–117)
BUN: 12 mg/dL (ref 6–23)
CO2: 30 mEq/L (ref 19–32)
Calcium: 9.4 mg/dL (ref 8.4–10.5)
Chloride: 102 mEq/L (ref 96–112)
Creatinine, Ser: 0.8 mg/dL (ref 0.40–1.20)
GFR: 80.66 mL/min (ref >60.00)
Glucose, Bld: 126 mg/dL — ABNORMAL HIGH (ref 70–99)
Potassium: 3.7 mEq/L (ref 3.5–5.1)
Sodium: 142 mEq/L (ref 135–145)
Total Bilirubin: 1.4 mg/dL — ABNORMAL HIGH (ref 0.2–1.2)
Total Protein: 7.6 g/dL (ref 6.0–8.3)

## 2015-07-05 MED ORDER — MECLIZINE HCL 12.5 MG PO TABS: 12.5000 mg | ORAL_TABLET | Freq: Three times a day (TID) | ORAL | Status: DC | PRN

## 2015-07-05 NOTE — Progress Notes (Addendum)
[place] at [facility] [address] [place], [place] [address] [phone]  Date:  07/05/2015   Name:  [name]   DOB:  07-Apr-1965   MRN:  [id number]  PCP:  Lamar Blinks, MD    Chief Complaint: Establish Care   History of Present Illness:  [name] is a 50 y.o. very pleasant female patient who presents with the following:  Here today as a new patient. She is a pt of Dr. Parke Poisson for her psychiatric concerns. Last visit with him in January- noted to have worsening depression, isolation, sadness.  She uses zoloft  She was in the ER on Monday with complaint of SOB, she was afraid that her CHF was getting worse. Also had complaint of diarrhea and was concerned about listeria from eating some waffles that were recalled for listeria contamination.  Tropoinin negative, BNP middle of the road at 243, CXR did not show any acute fluid effusion.    Her primary cardiologist is Dr. Percival Spanish.  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  Most recent echo 2015, EF 30- 35%.  Negative myoview 12/15  Also a few days ago she was "off balance" and fell a couple of times.  She has noted "some really bad headaches, some neck and shoulder soreness or stiffness that started out in the neck and it went down towards the shoulder blades. Some muscle aches.  I started getting the diarrhea, I had one episode of chills, my husband checked my temperature and it was normal."    She thinks that she may have listeria from reading about the symptoms.    No vomiting, no fever. She does complain of nausea She notes loose stools and has brought [place] a sample today She has had vertigo in the past and thinks that she may have this again. She cannot describe her sx well but states she will feel dizzy more if she moves her head quickly.  This seems to be what resulted in her falls the other day  She does take lasix 40 once a day at  baseline.  Also 20 meq of potassium   BP Readings from Last 3 Encounters:  07/05/15 107/69  07/04/15 129/73  05/05/15 119/67     Wt Readings from Last 3 Encounters:  07/05/15 293 lb (132.904 kg)  07/03/15 308 lb (139.708 kg)  05/05/15 308 lb (139.708 kg)     Patient Active Problem List   Diagnosis Date Noted  . Major depression ([place]) 01/04/2015  . Major depression in partial remission ([place]) 11/18/2014  . Obstructive sleep apnea 10/28/2014  . Depression 10/28/2014  . Chronic systolic CHF (congestive heart failure), NYHA class 3 ([place]) 12/06/2013  . MDD (major depressive disorder), recurrent episode, severe ([place]) 12/05/2013  . Noncompliance with medications 11/28/2013  . Acute on chronic systolic CHF (congestive heart failure), NYHA class 3 ([place]) 11/27/2013  . Dyslipidemia 04/04/2011  . Cervical radicular pain 03/25/2011  . Neck pain, acute 03/08/2011  . CARPAL TUNNEL SYNDROME, RIGHT 04/05/2009  . ABNORMAL ELECTROCARDIOGRAM 01/16/2009  . Uncontrolled hypertension 06/03/2008  . Morbid obesity ([place]) 07/12/2006  . DEPRESSION 07/12/2006  . Attention deficit hyperactivity disorder (ADHD) 07/12/2006  . MIGRAINE HEADACHE 07/12/2006  . CARDIOMYOPATHY, PERIPARTUM, POSTPARTUM 07/12/2006    Past Medical History  Diagnosis Date  . ADHD   . CARDIOMYOPATHY, PERIPARTUM, POSTPARTUM     a. EF previously 30-40%,  b.  resolved, EF 60%  c.  echo 10/2011:  mild LVH, EF 50%, mild LAE,   . MIGRAINE HEADACHE   . OBESITY   . DEPRESSION   . Essential hypertension, benign   . CARPAL TUNNEL SYNDROME, RIGHT   . Anxiety   . Edema     a. LE venous dopplers negative for DVT 10/2011  . CHF (congestive heart failure) ([place])   . Chronic systolic CHF (congestive heart failure), NYHA class 3 ([place]) 12/06/2013    Past Surgical History  Procedure Laterality Date  . Child birth  2003  . Cholecystectomy      Social History  Substance Use Topics  . Smoking status: Current Every Day Smoker -- 0.50  packs/day    Types: Cigarettes    Last Attempt to Quit: 02/25/1997  . Smokeless tobacco: None     Comment: Married, lives with spouse (when he is not traveling) and son  . Alcohol Use: No    Family History  Problem Relation Age of Onset  . Hypertension Other   . Diabetes Other   . Colitis Other   . Alcohol abuse Other   . Depression Mother   . Anxiety disorder Mother   . ADD / ADHD Father     Allergies  Allergen Reactions  . Cymbalta [Duloxetine Hcl] Other (See Comments)    depressed  . Duloxetine     REACTION: Sucidal thoughts  . Penicillins     REACTION: rash  . Prozac [Fluoxetine Hcl] Other (See Comments)    More depressed    Medication list has been reviewed and updated.  Current Outpatient Prescriptions on File Prior to Visit  Medication Sig Dispense Refill  . acetaminophen-codeine (TYLENOL #3) 300-30 MG tablet Take 1-2 tablets by mouth every 6 (six) hours as needed for moderate pain. 15 tablet 0  . amLODipine (NORVASC) 10 MG tablet Take 1 tablet (10 mg total) by mouth daily. 90 tablet 3  . amphetamine-dextroamphetamine (ADDERALL XR) 30 MG 24 hr capsule Take 1 capsule (30 mg total) by mouth daily. 30 capsule 0  . busPIRone (BUSPAR) 10 MG tablet Take 1 tablet (10 mg total) by mouth 2 (two) times daily. 180 tablet 0  . clonazePAM (KLONOPIN) 1 MG tablet Take 1 tablet (1 mg total) by mouth 2 (two) times daily as needed for anxiety. 60 tablet 0  . COREG 12.5 MG tablet TAKE 1 TABLET (12.5 MG TOTAL) BY MOUTH 2 (TWO) TIMES DAILY WITH A MEAL. 180 tablet 3  . COREG 25 MG tablet TAKE 1 TABLET TWICE A DAY WITH MEALS (PER INS, SHOULD GO THRU ON 8/16) 180 tablet 2  . enalapril (VASOTEC) 20 MG tablet Take 1 tablet (20 mg total) by mouth 2 (two) times daily. 180 tablet 3  . furosemide (LASIX) 40 MG tablet Take 1 tablet (40 mg total) by mouth daily. 90 tablet 3  . ibuprofen (ADVIL,MOTRIN) 800 MG tablet Take 800 mg by mouth every 8 (eight) hours as needed for headache or mild pain.      [name] KLOR-CON M20 20 MEQ tablet TAKE 1 TABLET (20 MEQ TOTAL) BY MOUTH DAILY. 90 tablet 3  . lamoTRIgine (LAMICTAL) 200 MG tablet Take 1 tablet (200 mg total) by mouth daily. 90 tablet 0  . lidocaine (XYLOCAINE) 2 % jelly Apply 1 application topically as needed. 30 mL 0  . sertraline (ZOLOFT) 100 MG tablet Take 1 tablet (100 mg total) by mouth 2 (two) times daily. 180 tablet 0  . simvastatin (ZOCOR) 20 MG tablet Take 1 tablet (  20 mg total) by mouth daily. 90 tablet 3   No current facility-administered medications on file prior to visit.    Review of Systems:  As per HPI- otherwise negative.   Physical Examination: Filed Vitals:   07/05/15 0950 07/05/15 0955  BP: 107/69   Pulse: 74 77  Temp: 98.2 F (36.8 C)    Filed Vitals:   07/05/15 0950  Height: 5\' 3"  (1.6 m)  Weight: 293 lb (132.904 kg)   Body mass index is 51.92 kg/(m^2). Ideal Body Weight: Weight in (lb) to have BMI = 25: 140.8  GEN: WDWN, NAD, Non-toxic, A & O x 3,quite obese, looks well HEENT: Atraumatic, Normocephalic. Neck supple. No masses, No LAD.  Bilateral TM wnl, oropharynx normal.  PEERL,EOMI.   Ears and Nose: No external deformity. CV: RRR, No M/G/R. No JVD. No thrill. No extra heart sounds. PULM: CTA B, no wheezes, crackles, rhonchi. No retractions. No resp. distress. No accessory muscle use. ABD: S, NT, ND. No rebound. No HSM. EXTR: No c/c/.  Pt feels that she has edema of her ankles.  No pitting or other obvious edema on exam NEURO Normal gait. Normal strength, sensation and ROM of all extremities  PSYCH: Normally interactive. Conversant. Not depressed or anxious appearing.  Calm demeanor.    Assessment and Plan: Chronic systolic congestive heart failure ([place]) - Plan: Comprehensive metabolic panel  Diarrhea, unspecified type - Plan: Stool culture  Malaise - Plan: Blood culture (routine single)  Screening examination for infectious disease - Plan: Blood culture (routine single)  Nausea without  vomiting - Plan: meclizine (ANTIVERT) 12.5 MG tablet  Vertigo  Here today to establish care and with numerous other issues.   Her weight is stable to decreased which is reassuring that her CHF is not out of hand.  She does feel like she is swollen though and has some sx of orthopnea.  Normal CXR 2 days ago.  cmp today- if lytes ok she can increase her lasix for a couple of days.     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  She would like to cultrue her stool which is ok.  Also very doubtful of any systemic infection but will do a blood culture for completeness  She has vertigo and nausea.  Recent prolonged QTc on EKG so will not use zofran.  Will treat with meclizine as needed  Will call her to go over labs when results are in [place], MD  Called her to go over labs 5/15- Randell Loop is adding on a listeria PCR test for [place]. Left message with her labs so far, will be back in touch with listeria. I hope that she is feeling better, let me know if not   Results for orders placed or performed in visit on 07/05/15  Stool culture  Result Value Ref Range   Organism ID, Bacteria No Salmonella,Shigella,Campylobacter,Yersinia,or    Organism ID, Bacteria No E.coli [id number] isolated.   Blood culture (routine single)  Result Value Ref Range   Preliminary Report Blood Culture received; No Growth to date;    Preliminary Report Culture will be held for 5 days before issuing    Preliminary Report a Final Negative report.   Comprehensive metabolic panel  Result Value Ref Range   Sodium 142 135 - 145 mEq/L   Potassium 3.7 3.5 - 5.1 mEq/L   Chloride 102 96 - 112 mEq/L   CO2 30 19 - 32 mEq/L   Glucose, Bld  126 (H) 70 - 99 mg/dL   BUN 12 6 - 23 mg/dL   Creatinine, Ser 0.80 0.40 - 1.20 mg/dL   Total Bilirubin 1.4 (H) 0.2 - 1.2 mg/dL   Alkaline Phosphatase 30 (L) 39 - 117 U/L   AST 21 0 - 37 U/L   ALT 16 0 - 35 U/L   Total Protein 7.6 6.0 - 8.3 g/dL    Albumin 3.9 3.5 - 5.2 g/dL   Calcium 9.4 8.4 - 10.5 mg/dL   GFR 80.66 >60.00 mL/min

## 2015-07-05 NOTE — Patient Instructions (Signed)
It was nice to see you today We will check your stool for listeria and I will also have the lab do a blood culture. 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 I will check your metabolic profile today and make sure you can tolerate an extra dose of lasix for a day or two.   For the time being cut your amlodipine in half- 5mg  a day  I will be in touch with your labs asap

## 2015-07-05 NOTE — Progress Notes (Signed)
Pre visit review using our clinic review tool, if applicable. No additional management support is needed unless otherwise documented below in the visit note. 

## 2015-07-09 LAB — STOOL CULTURE

## 2015-07-11 LAB — CULTURE, BLOOD (SINGLE): Organism ID, Bacteria: NO GROWTH

## 2015-07-19 ENCOUNTER — Encounter: Payer: Self-pay | Admitting: Family Medicine

## 2015-07-19 ENCOUNTER — Ambulatory Visit (INDEPENDENT_AMBULATORY_CARE_PROVIDER_SITE_OTHER): Payer: Medicare Other | Admitting: Family Medicine

## 2015-07-19 VITALS — BP 131/84 | HR 86 | Temp 98.8°F | Ht 63.0 in | Wt 294.0 lb

## 2015-07-19 DIAGNOSIS — F909 Attention-deficit hyperactivity disorder, unspecified type: Secondary | ICD-10-CM

## 2015-07-19 DIAGNOSIS — F988 Other specified behavioral and emotional disorders with onset usually occurring in childhood and adolescence: Secondary | ICD-10-CM

## 2015-07-19 DIAGNOSIS — I5022 Chronic systolic (congestive) heart failure: Secondary | ICD-10-CM

## 2015-07-19 DIAGNOSIS — Z79899 Other long term (current) drug therapy: Secondary | ICD-10-CM | POA: Diagnosis not present

## 2015-07-19 MED ORDER — AMPHETAMINE-DEXTROAMPHET ER 30 MG PO CP24: 30.0000 mg | ORAL_CAPSULE | Freq: Every day | ORAL | Status: DC

## 2015-07-19 NOTE — Patient Instructions (Addendum)
Please come and see me in a couple of months for a pap unless I find a recent one in Dr. Ronnell Freshwater records  Please stop by the lab for a urine drug screen today (required for all of our patients on controlled medications such as adderall), and also please fill out a records release for Dr. Jobe Igo today at the front desk  LAB- UDS please

## 2015-07-19 NOTE — Progress Notes (Signed)
[place] at [facility] [address], [place], [place] [address] [phone]  Date:  07/19/2015   Name:  [name]   DOB:  1965-05-30   MRN:  [id number]  PCP:  Lamar Blinks, MD    Chief Complaint: Follow-up   History of Present Illness:  [name] is a 50 y.o. very pleasant female patient who presents with the following:  History of CHF.  Here today for a follow-up visit.  Last seen by myself on 5/10; partial HPI as follows  Her primary cardiologist is Dr. Percival Spanish. 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 Most recent echo 2015, EF 30- 35%. Negative myoview 12/15  Wt Readings from Last 3 Encounters:  07/19/15 294 lb (133.358 kg)  07/05/15 293 lb (132.904 kg)  07/03/15 308 lb (139.708 kg)    She notes that her last PCP Dr. Jobe Igo was treating her with adderall for ADD. She did not mention this to me at our last visit.   The [facility] does show that she has received addreall ER 30 mg monthly fills.  Most recently filled 07/05/15.  No unexpected entries. She wonders if I can take over this rs for her  She is taking both 12.5 and 25 of coreg BID to equal 37.5 mg  Her psychiatrist rx buspar and lamictal for her- he does not prescribe her adderall.   She was dx with ADD at approx age 22 She has tried other medications but has had SE such as elevation of her BP or interference with herother meds  Patient Active Problem List   Diagnosis Date Noted  . Major depression ([place]) 01/04/2015  . Major depression in partial remission ([place]) 11/18/2014  . Obstructive sleep apnea 10/28/2014  . Depression 10/28/2014  . Chronic systolic CHF (congestive heart failure), NYHA class 3 ([place]) 12/06/2013  . MDD (major depressive disorder), recurrent episode, severe ([place]) 12/05/2013  . Noncompliance with medications 11/28/2013  . Acute on chronic systolic CHF (congestive heart failure), NYHA class 3 ([place])  11/27/2013  . Dyslipidemia 04/04/2011  . Cervical radicular pain 03/25/2011  . Neck pain, acute 03/08/2011  . CARPAL TUNNEL SYNDROME, RIGHT 04/05/2009  . ABNORMAL ELECTROCARDIOGRAM 01/16/2009  . Uncontrolled hypertension 06/03/2008  . Morbid obesity ([place]) 07/12/2006  . DEPRESSION 07/12/2006  . Attention deficit hyperactivity disorder (ADHD) 07/12/2006  . MIGRAINE HEADACHE 07/12/2006  . CARDIOMYOPATHY, PERIPARTUM, POSTPARTUM 07/12/2006    Past Medical History  Diagnosis Date  . ADHD   . CARDIOMYOPATHY, PERIPARTUM, POSTPARTUM     a. EF previously 30-40%,  b.  resolved, EF 60%    c.  echo 10/2011:  mild LVH, EF 50%, mild LAE,   . MIGRAINE HEADACHE   . OBESITY   . DEPRESSION   . Essential hypertension, benign   . CARPAL TUNNEL SYNDROME, RIGHT   . Anxiety   . Edema     a. LE venous dopplers negative for DVT 10/2011  . CHF (congestive heart failure) ([place])   . Chronic systolic CHF (congestive heart failure), NYHA class 3 ([place]) 12/06/2013  . Sleep apnea     Past Surgical History  Procedure Laterality Date  . Child birth  2003  . Cholecystectomy      Social History  Substance Use Topics  . Smoking status: Current Every Day Smoker -- 0.50 packs/day    Types: Cigarettes    Last Attempt to Quit: 02/25/1997  . Smokeless tobacco: None  Comment: Married, lives with spouse (when he is not traveling) and son  . Alcohol Use: No    Family History  Problem Relation Age of Onset  . Hypertension Other   . Diabetes Other   . Colitis Other   . Alcohol abuse Other   . Depression Mother   . Anxiety disorder Mother   . ADD / ADHD Father     Allergies  Allergen Reactions  . Cymbalta [Duloxetine Hcl] Other (See Comments)    depressed  . Duloxetine     REACTION: Sucidal thoughts  . Penicillins     REACTION: rash  . Prozac [Fluoxetine Hcl] Other (See Comments)    More depressed    Medication list has been reviewed and updated.  Current Outpatient Prescriptions on File Prior  to Visit  Medication Sig Dispense Refill  . amLODipine (NORVASC) 10 MG tablet Take 1 tablet (10 mg total) by mouth daily. 90 tablet 3  . amphetamine-dextroamphetamine (ADDERALL XR) 30 MG 24 hr capsule Take 1 capsule (30 mg total) by mouth daily. 30 capsule 0  . busPIRone (BUSPAR) 10 MG tablet Take 1 tablet (10 mg total) by mouth 2 (two) times daily. 180 tablet 0  . clonazePAM (KLONOPIN) 1 MG tablet Take 1 tablet (1 mg total) by mouth 2 (two) times daily as needed for anxiety. (Patient taking differently: Take 1 mg by mouth. Take 1 tablet (1 mg total) by mouth 2 (two) times daily for anxiety. Take third tablet if needed.) 60 tablet 0  . COREG 12.5 MG tablet TAKE 1 TABLET (12.5 MG TOTAL) BY MOUTH 2 (TWO) TIMES DAILY WITH A MEAL. 180 tablet 3  . COREG 25 MG tablet TAKE 1 TABLET TWICE A DAY WITH MEALS (PER INS, SHOULD GO THRU ON 8/16) 180 tablet 2  . enalapril (VASOTEC) 20 MG tablet Take 1 tablet (20 mg total) by mouth 2 (two) times daily. 180 tablet 3  . furosemide (LASIX) 40 MG tablet Take 1 tablet (40 mg total) by mouth daily. 90 tablet 3  . ibuprofen (ADVIL,MOTRIN) 800 MG tablet Take 800 mg by mouth every 8 (eight) hours as needed for headache or mild pain.     [name] KLOR-CON M20 20 MEQ tablet TAKE 1 TABLET (20 MEQ TOTAL) BY MOUTH DAILY. 90 tablet 3  . lamoTRIgine (LAMICTAL) 200 MG tablet Take 1 tablet (200 mg total) by mouth daily. 90 tablet 0  . lidocaine (XYLOCAINE) 2 % jelly Apply 1 application topically as needed. 30 mL 0  . sertraline (ZOLOFT) 100 MG tablet Take 1 tablet (100 mg total) by mouth 2 (two) times daily. 180 tablet 0  . simvastatin (ZOCOR) 20 MG tablet Take 1 tablet (20 mg total) by mouth daily. 90 tablet 3  . meclizine (ANTIVERT) 12.5 MG tablet Take 1 tablet (12.5 mg total) by mouth 3 (three) times daily as needed for dizziness. Or nausea (Patient not taking: Reported on 07/19/2015) 30 tablet 0   No current facility-administered medications on file prior to visit.    Review of  Systems:  As per HPI- otherwise negative.   Physical Examination: Filed Vitals:   07/19/15 0952  BP: 131/84  Pulse: 86  Temp: 98.8 F (37.1 C)   Filed Vitals:   07/19/15 0952  Height: 5\' 3"  (1.6 m)  Weight: 294 lb (133.358 kg)   Body mass index is 52.09 kg/(m^2). Ideal Body Weight: Weight in (lb) to have BMI = 25: 140.8  GEN: WDWN, NAD, Non-toxic, A & O x 3, obese,  looks well HEENT: Atraumatic, Normocephalic. Neck supple. No masses, No LAD. Ears and Nose: No external deformity. CV: RRR, No M/G/R. No JVD. No thrill. No extra heart sounds. PULM: CTA B, no wheezes, crackles, rhonchi. No retractions. No resp. distress. No accessory muscle use. ABD: S, NT, ND, +BS. No rebound. No HSM. EXTR: No c/c/e NEURO Normal gait.  PSYCH: Normally interactive. Conversant. Not depressed or anxious appearing.  Calm demeanor.    Assessment and Plan: ADD (attention deficit disorder) - Plan: amphetamine-dextroamphetamine (ADDERALL XR) 30 MG 24 hr capsule, DISCONTINUED: amphetamine-dextroamphetamine (ADDERALL XR) 30 MG 24 hr capsule  Chronic systolic congestive heart failure (H[facility])  Morbid obesity, unspecified obesity type ([place])  Began to rx her adderall xr for her today. She will take UDS and we will get her on normal routine of refills every 3 months and face to face visit every 6 months assuming all goes well She is taking her cardiac medications and her weight is stable, no sx of CHF exacerbation or hypervolemia right now unfortunately she is quite obese with BMI over 50 which is detrimental to her overall health  Records release today Plan to recheck in 2 months   Signed Lamar Blinks, MD

## 2015-07-19 NOTE — Progress Notes (Signed)
Pre visit review using our clinic tool,if applicable. No additional management support is needed unless otherwise documented below in the visit note.  

## 2015-07-21 ENCOUNTER — Telehealth: Payer: Self-pay | Admitting: Family Medicine

## 2015-07-21 NOTE — Telephone Encounter (Signed)
Leanne from [facility] called in because she received a release form requesting pt's records. She says that there records are also on EPIC.

## 2015-07-28 NOTE — Telephone Encounter (Signed)
Called her- got her drug screen which showed adderall and klonopin as expected.  She reports that she is still taking klonopin on a regular basis from her psychiatrist.  Her drug screen is low risk

## 2015-08-23 ENCOUNTER — Encounter: Payer: Self-pay | Admitting: Family Medicine

## 2015-08-31 ENCOUNTER — Ambulatory Visit (INDEPENDENT_AMBULATORY_CARE_PROVIDER_SITE_OTHER): Payer: Medicare Other | Admitting: Family Medicine

## 2015-08-31 ENCOUNTER — Encounter: Payer: Self-pay | Admitting: Family Medicine

## 2015-08-31 VITALS — BP 115/67 | HR 92 | Temp 98.0°F | Ht 63.0 in | Wt 284.0 lb

## 2015-08-31 DIAGNOSIS — M5441 Lumbago with sciatica, right side: Secondary | ICD-10-CM

## 2015-08-31 DIAGNOSIS — M79641 Pain in right hand: Secondary | ICD-10-CM | POA: Diagnosis not present

## 2015-08-31 DIAGNOSIS — M79642 Pain in left hand: Secondary | ICD-10-CM | POA: Diagnosis not present

## 2015-08-31 MED ORDER — CYCLOBENZAPRINE HCL 10 MG PO TABS: ORAL_TABLET | ORAL | Status: DC

## 2015-08-31 MED ORDER — OXYCODONE-ACETAMINOPHEN 2.5-325 MG PO TABS: 1.0000 | ORAL_TABLET | Freq: Four times a day (QID) | ORAL | Status: DC | PRN

## 2015-08-31 NOTE — Patient Instructions (Signed)
We will try oxycodone and flexeril for your back pain.  We need to be cautious with these as you are on other sedating medications as well. Avoid driving or drinking any alcohol while you are using these medications  Avoid taking both oxycodone and flexeril at the same time unless absolutely necessary.  If you are also taking your klonopin I would recommend that you take a 1/2 klonopin only  I would recommend that you follow-up with your spine specialist to help [place] with your back pain in the future  [phone]

## 2015-08-31 NOTE — Progress Notes (Signed)
[facility] [address], [place], [place] [address] [phone]  Date:  08/31/2015   Name:  [name]   DOB:  Oct 15, 1965   MRN:  [id number]  PCP:  Lamar Blinks, MD    Chief Complaint: Body pain   History of Present Illness:  [name] is a 50 y.o. very pleasant female patient who presents with the following:  History of CHF due to post- partum cardiomyopathy.  She was last admitted 11/2013 with acute on chronic CHF with most recent EF 30-35% April 2015.  Negative stress 01/2014  She is treated for ADD with adderall Psychiatry treats her with buspar and lamictal for depression and anxiety.  She is not known to have psychosis or bipolar disorder.   Wt Readings from Last 3 Encounters:  08/31/15 284 lb (128.822 kg)  07/19/15 294 lb (133.358 kg)  07/05/15 293 lb (132.904 kg)   She states that "I'm in trouble, I'm in a lot of pain."  She describes various pains in several locations of her body.  She is a challenging historian and gives information about a lot of different issues that occurred at different times in rapid sucession.   [facility] (spinal stenosis)- she most recently saw Dr. Sherlyn Lick at some point in the last few years, but it seems she does not have a surgical case at that time.    Most recent cervical spine MRI 11/2014 1. Similar appearance of right paramedian disc protrusions at C4-5 and more prominently at C5-6 with distortion of the right side of the cord at both levels but no abnormal signal. 2. Mild uncovertebral and facet disease at C2-3 and C3-4 without significant stenosis. 3. Mild left foraminal narrowing at C4-5. 4. Shallow disc protrusions at C6-7 and C7-T1 without significant stenosis.  Also states that she has spine disease ine her lower back- MRI of her lower back not available currently She notes that "my back hurts all the  time."  She has bilateral hand pain that will be intermittent and came back 7- 10 days ago. She has been told by neurosurg that this was due to her neck.  It is worse in the mornings She also noted onset of right sided sciatica about a month ago, will wax and wane  She was on lyrica in the past- maybe 1-2 years ago. This did seem to help her.  She had some left at home and started taking it again 3 days ago. It does seem to be helping her with her hand pain but does not help with her neck or back pain  She is supposed to travel to [place] by car soon and is worried about how she will do during this long trip  We called her old PCP who had treated her with toradol, percocet, cyclobenzaprine for her back pain flares.  She was using 7.5 mg of oxycodone per dose at that time She uses klonopin 1mg  BID.  Has used antivert for vertigo or nausea in the past but is not taking this now  Patient Active Problem List   Diagnosis Date Noted  . Major depression ([place]) 01/04/2015  . Major depression in partial remission ([place]) 11/18/2014  . Obstructive sleep apnea 10/28/2014  . Depression 10/28/2014  . Chronic systolic CHF (congestive heart failure), NYHA class 3 ([place]) 12/06/2013  . MDD (major depressive disorder), recurrent episode, severe ([place]) 12/05/2013  .  Noncompliance with medications 11/28/2013  . Acute on chronic systolic CHF (congestive heart failure), NYHA class 3 ([place]) 11/27/2013  . Dyslipidemia 04/04/2011  . Cervical radicular pain 03/25/2011  . Neck pain, acute 03/08/2011  . CARPAL TUNNEL SYNDROME, RIGHT 04/05/2009  . ABNORMAL ELECTROCARDIOGRAM 01/16/2009  . Uncontrolled hypertension 06/03/2008  . Morbid obesity ([place]) 07/12/2006  . DEPRESSION 07/12/2006  . Attention deficit hyperactivity disorder (ADHD) 07/12/2006  . MIGRAINE HEADACHE 07/12/2006  . CARDIOMYOPATHY, PERIPARTUM, POSTPARTUM 07/12/2006    Past Medical History  Diagnosis Date  . ADHD   . CARDIOMYOPATHY, PERIPARTUM, POSTPARTUM      a. EF previously 30-40%,  b.  resolved, EF 60%    c.  echo 10/2011:  mild LVH, EF 50%, mild LAE,   . MIGRAINE HEADACHE   . OBESITY   . DEPRESSION   . Essential hypertension, benign   . CARPAL TUNNEL SYNDROME, RIGHT   . Anxiety   . Edema     a. LE venous dopplers negative for DVT 10/2011  . CHF (congestive heart failure) ([place])   . Chronic systolic CHF (congestive heart failure), NYHA class 3 ([place]) 12/06/2013  . Sleep apnea     Past Surgical History  Procedure Laterality Date  . Child birth  2003  . Cholecystectomy      Social History  Substance Use Topics  . Smoking status: Current Every Day Smoker -- 0.50 packs/day    Types: Cigarettes    Last Attempt to Quit: 02/25/1997  . Smokeless tobacco: None     Comment: Married, lives with spouse (when he is not traveling) and son  . Alcohol Use: No    Family History  Problem Relation Age of Onset  . Hypertension Other   . Diabetes Other   . Colitis Other   . Alcohol abuse Other   . Depression Mother   . Anxiety disorder Mother   . ADD / ADHD Father     Allergies  Allergen Reactions  . Cymbalta [Duloxetine Hcl] Other (See Comments)    depressed  . Duloxetine     REACTION: Sucidal thoughts  . Penicillins     REACTION: rash  . Prozac [Fluoxetine Hcl] Other (See Comments)    More depressed    Medication list has been reviewed and updated.  Current Outpatient Prescriptions on File Prior to Visit  Medication Sig Dispense Refill  . amLODipine (NORVASC) 10 MG tablet Take 1 tablet (10 mg total) by mouth daily. 90 tablet 3  . amphetamine-dextroamphetamine (ADDERALL XR) 30 MG 24 hr capsule Take 1 capsule (30 mg total) by mouth daily. Ok to fill on 30 days after rx 30 capsule 0  . busPIRone (BUSPAR) 10 MG tablet Take 1 tablet (10 mg total) by mouth 2 (two) times daily. 180 tablet 0  . clonazePAM (KLONOPIN) 1 MG tablet Take 1 tablet (1 mg total) by mouth 2 (two) times daily as needed for anxiety. (Patient taking differently: Take  1 mg by mouth. Take 1 tablet (1 mg total) by mouth 2 (two) times daily for anxiety. Take third tablet if needed.) 60 tablet 0  . COREG 12.5 MG tablet TAKE 1 TABLET (12.5 MG TOTAL) BY MOUTH 2 (TWO) TIMES DAILY WITH A MEAL. 180 tablet 3  . COREG 25 MG tablet TAKE 1 TABLET TWICE A DAY WITH MEALS (PER INS, SHOULD GO THRU ON 8/16) 180 tablet 2  . enalapril (VASOTEC) 20 MG tablet Take 1 tablet (20 mg total) by mouth 2 (two) times daily. 180 tablet 3  .  furosemide (LASIX) 40 MG tablet Take 1 tablet (40 mg total) by mouth daily. 90 tablet 3  . ibuprofen (ADVIL,MOTRIN) 800 MG tablet Take 800 mg by mouth every 8 (eight) hours as needed for headache or mild pain.     [name] KLOR-CON M20 20 MEQ tablet TAKE 1 TABLET (20 MEQ TOTAL) BY MOUTH DAILY. 90 tablet 3  . lamoTRIgine (LAMICTAL) 200 MG tablet Take 1 tablet (200 mg total) by mouth daily. 90 tablet 0  . meclizine (ANTIVERT) 12.5 MG tablet Take 1 tablet (12.5 mg total) by mouth 3 (three) times daily as needed for dizziness. Or nausea 30 tablet 0  . sertraline (ZOLOFT) 100 MG tablet Take 1 tablet (100 mg total) by mouth 2 (two) times daily. 180 tablet 0  . simvastatin (ZOCOR) 20 MG tablet Take 1 tablet (20 mg total) by mouth daily. 90 tablet 3   No current facility-administered medications on file prior to visit.    Review of Systems:  As per HPI- otherwise negative.   Physical Examination: Filed Vitals:   08/31/15 1607  BP: 115/67  Pulse: 92  Temp: 98 F (36.7 C)   Filed Vitals:   08/31/15 1607  Height: 5\' 3"  (1.6 m)  Weight: 284 lb (128.822 kg)   Body mass index is 50.32 kg/(m^2). Ideal Body Weight: Weight in (lb) to have BMI = 25: 140.8  GEN: WDWN, NAD, Non-toxic, A & O x 3, BMI 50 HEENT: Atraumatic, Normocephalic. Neck supple. No masses, No LAD. Ears and Nose: No external deformity. CV: RRR, No M/G/R. No JVD. No thrill. No extra heart sounds. PULM: CTA B, no wheezes, crackles, rhonchi. No retractions. No resp. distress. No accessory muscle  use. EXTR: No c/c/e NEURO Normal gait.  PSYCH: Normally interactive. Conversant. Not depressed or anxious appearing.  Calm demeanor.  Bilateral hands and wrists are normal.  Back exam is somewhat limited by body habitus.  She endorses tenderness in the paraspinous lumbar muscles.  Flexion is restricted.  Appears uncomfortable in getting up onto exam table, but has normal bilateral LE strength, sensation and DTR, negative SLR bilaterally    Assessment and Plan: Right-sided low back pain with right-sided sciatica - Plan: cyclobenzaprine (FLEXERIL) 10 MG tablet, oxycodone-acetaminophen (PERCOCET) 2.5-325 MG tablet  Bilateral hand pain  Morbid obesity, unspecified obesity type ([place])  Here today with complaint of multiple sites of pain in her body  She would like me to tx percocet 7.5 as well as flexeril.  However explained that I am concerned about excessive sedation- she is also taking klonopin and lamictal.  rx for 2.5 mg of oxycodone as below.  Also asked her to follow-up with her NSG; she has what sounds like a long history of spine trouble that I do not have access to.   Meds ordered this encounter  Medications  . cyclobenzaprine (FLEXERIL) 10 MG tablet    Sig: Take 1/2 pill twice a day as needed for muscle spasm    Dispense:  30 tablet    Refill:  0  . oxycodone-acetaminophen (PERCOCET) 2.5-325 MG tablet    Sig: Take 1 tablet by mouth every 6 (six) hours as needed for pain.    Dispense:  30 tablet    Refill:  0   We will try oxycodone and flexeril for your back pain.  We need to be cautious with these as you are on other sedating medications as well. Avoid driving or drinking any alcohol while you are using these medications  Avoid taking both  oxycodone and flexeril at the same time unless absolutely necessary.  If you are also taking your klonopin I would recommend that you take a 1/2 klonopin only  I would recommend that you follow-up with your spine specialist to help [place] with your  back pain in the future  [id number]    Signed Lamar Blinks, MD

## 2015-08-31 NOTE — Progress Notes (Signed)
Pre visit review using our clinic review tool, if applicable. No additional management support is needed unless otherwise documented below in the visit note. 

## 2015-09-05 ENCOUNTER — Telehealth: Payer: Self-pay | Admitting: Cardiology

## 2015-09-05 ENCOUNTER — Other Ambulatory Visit: Payer: Self-pay | Admitting: Cardiology

## 2015-09-05 ENCOUNTER — Other Ambulatory Visit (HOSPITAL_COMMUNITY): Payer: Self-pay | Admitting: Psychiatry

## 2015-09-05 NOTE — Telephone Encounter (Signed)
New message     Patient calling aware that Dr. Percival Spanish is not in the office today only wants to speak with Dr. Percival Spanish when he returns.     Patient PCP move to [place]. Patient having some issues with Dr. Edilia Bo regarding pain medication - percocet 2.5 -325mg     No chest pain, No sob.

## 2015-09-05 NOTE — Telephone Encounter (Signed)
Returned call to patient. She had a number of concerns r/t her new PCP. She stated she wanted Dr [employer] professional opinion if it was ok for her to be on Percocet 7.5/325 and a muscle relaxer r/t her heart. Dr Lorelei Pont did not prescribe her the medications the patient was asking for. Patient is due for office visit with Dr Percival Spanish. This was scheduled for 10/16/15 as patient is leaving soon to go to [place] for about a month. She talked in length about her spinal stenosis pain in her neck and back and is concerned about her pain management. She says she has a spinal doctor so I encouraged her to reach out to him if necessary. She is looking forward speaking with Dr Percival Spanish about her concerns. She is content with the plan to discuss at appt in august.   Will forward to Dr Percival Spanish as an Juluis Rainier.

## 2015-09-06 ENCOUNTER — Other Ambulatory Visit: Payer: Self-pay | Admitting: Cardiology

## 2015-10-15 NOTE — Progress Notes (Addendum)
HPI The patient presents for evaluation of edema and palpitations and cardiomyopathy.   Her last EF was 35%.  She did have a stress perfusion study that did not demonstrate ischemia in 2015.  She had a sleep study.  She has been diagnosed with sleep apnea..  She has many issues with anxiety and somatic complaints.  She has significant complaints of back pain neck pain and neuropathy. She talked at great length about this today. She says that she's not been given some of her pain medications because of her history of heart failure. When we finally started talking about cardiac issues she doesn't seem to have any acute complaints. However, I was able to review and ER visit from May of this year. She had some dyspnea and a very mildly elevated BNP although there was no other evidence of edema. She's not describing any new PND or orthopnea. She's not having any palpitations, presyncope or syncope. She only had lower extremity swelling she had to drive to [place] recently.  Allergies  Allergen Reactions  . Cymbalta [Duloxetine Hcl] Other (See Comments)    depressed  . Duloxetine     REACTION: Sucidal thoughts  . Penicillins     REACTION: rash  . Prozac [Fluoxetine Hcl] Other (See Comments)    More depressed    Current Outpatient Prescriptions  Medication Sig Dispense Refill  . amLODipine (NORVASC) 10 MG tablet Take 1 tablet (10 mg total) by mouth daily. 90 tablet 3  . amphetamine-dextroamphetamine (ADDERALL XR) 30 MG 24 hr capsule Take 1 capsule (30 mg total) by mouth daily. Ok to fill on 30 days after rx 30 capsule 0  . busPIRone (BUSPAR) 10 MG tablet TAKE 1 TABLET TWICE A DAY 180 tablet 0  . carvedilol (COREG) 12.5 MG tablet Take 1 tablet (12.5 mg total) by mouth 2 (two) times daily with a meal. 180 tablet 3  . carvedilol (COREG) 25 MG tablet Take 1 tablet (25 mg total) by mouth 2 (two) times daily with a meal. 180 tablet 3  . clonazePAM (KLONOPIN) 1 MG tablet TAKE 1 TABLET BY MOUTH 3 TIMES A  DAY AS NEEDED FOR ANXIETY 90 tablet 1  . cyclobenzaprine (FLEXERIL) 10 MG tablet Take 1/2 pill twice a day as needed for muscle spasm 30 tablet 0  . enalapril (VASOTEC) 20 MG tablet Take 1 tablet (20 mg total) by mouth 2 (two) times daily. 180 tablet 3  . furosemide (LASIX) 40 MG tablet Take 1 tablet (40 mg total) by mouth daily. 90 tablet 3  . ibuprofen (ADVIL,MOTRIN) 800 MG tablet Take 800 mg by mouth every 8 (eight) hours as needed for headache or mild pain.     [name] lamoTRIgine (LAMICTAL) 200 MG tablet TAKE 1 TABLET EVERY DAY 90 tablet 0  . oxycodone-acetaminophen (PERCOCET) 2.5-325 MG tablet Take 1 tablet by mouth every 6 (six) hours as needed for pain. 30 tablet 0  . oxyCODONE-acetaminophen (PERCOCET) 7.5-325 MG tablet Take 1 tablet by mouth every 4 (four) hours as needed for severe pain.    . potassium chloride SA (KLOR-CON M20) 20 MEQ tablet Take 1 tablet (20 mEq total) by mouth daily. 90 tablet 3  . sertraline (ZOLOFT) 100 MG tablet TAKE 1 TABLET TWICE A DAY 180 tablet 0  . simvastatin (ZOCOR) 20 MG tablet Take 1 tablet (20 mg total) by mouth daily at 6 PM. 90 tablet 3   No current facility-administered medications for this visit.     Past Medical History:  Diagnosis Date  . ADHD   . Anxiety   . CARDIOMYOPATHY, PERIPARTUM, POSTPARTUM    a. EF previously 30-40%,  b.  resolved, EF 60%    c.  echo 10/2011:  mild LVH, EF 50%, mild LAE,   . CARPAL TUNNEL SYNDROME, RIGHT   . CHF (congestive heart failure) ([place])   . Chronic systolic CHF (congestive heart failure), NYHA class 3 ([place]) 12/06/2013  . DEPRESSION   . Edema    a. LE venous dopplers negative for DVT 10/2011  . Essential hypertension, benign   . MIGRAINE HEADACHE   . OBESITY   . Sleep apnea     Past Surgical History:  Procedure Laterality Date  . child birth  2003  . CHOLECYSTECTOMY     ROS:  As stated in the HPI and negative for all other systems.  PHYSICAL EXAM BP 116/68 (BP Location: Left Arm, Patient Position:  Sitting, Cuff Size: Large)   Pulse 94   Ht 5\' 3"  (1.6 m)   Wt 294 lb 9.6 oz (133.6 kg)   BMI 52.19 kg/m  GENERAL:  Well appearing NECK:  No jugular venous distention, waveform within normal limits, carotid upstroke brisk and symmetric, no bruits, no thyromegaly LUNGS:  Clear to auscultation bilaterally BACK:  No CVA tenderness CHEST:  Unremarkable HEART:  PMI not displaced or sustained,S1 and S2 within normal limits, no S3, no S4, no clicks, no rubs, no murmurs ABD:  Flat, positive bowel sounds normal in frequency in pitch, no bruits, no rebound, no guarding, no midline pulsatile mass, no hepatomegaly, no splenomegaly, obese EXT:  2 plus pulses throughout, mild to trace left greater than right lower extremity edema, no cyanosis no clubbing NEURO: Nonfocal.    EKG:  Sinus rhythm, rate 77, ectopy, axis within normal limits, poor anterior R wave progression, no acute ST-T wave changes.   07/15/15  ASSESSMENT AND PLAN  EDEMA - She will continue the meds as listed.  She seems to be euvolemic.  I will be checking an echocardiogram.  Further medical management will be based on this.   CARDIOMYOPATHY, PERIPARTUM, POSTPARTUM -  As above  ESSENTIAL HYPERTENSION, BENIGN -  This is being managed in the context of treating his CHF.  No change in therapy is indicated.   ANXIETY DEPRESSION - This is ongoing.  She continues to have active therapy for this.   APNEA - She has been diagnosed with sleep apnea. She has been prescribed CPAP  BACK PAIN - I told her I will defer management to her primary provider.  She was apparently on nonsteroidals which is helping.  These would not be absolutely contraindicated unless she has worsening dyspnea.    ED records reviewed.

## 2015-10-16 ENCOUNTER — Ambulatory Visit (INDEPENDENT_AMBULATORY_CARE_PROVIDER_SITE_OTHER): Payer: Medicare Other | Admitting: Cardiology

## 2015-10-16 ENCOUNTER — Encounter: Payer: Self-pay | Admitting: Cardiology

## 2015-10-16 VITALS — BP 116/68 | HR 94 | Ht 63.0 in | Wt 294.6 lb

## 2015-10-16 DIAGNOSIS — I5043 Acute on chronic combined systolic (congestive) and diastolic (congestive) heart failure: Secondary | ICD-10-CM

## 2015-10-16 DIAGNOSIS — R0602 Shortness of breath: Secondary | ICD-10-CM

## 2015-10-16 DIAGNOSIS — I429 Cardiomyopathy, unspecified: Secondary | ICD-10-CM

## 2015-10-16 MED ORDER — CARVEDILOL 12.5 MG PO TABS: 12.5000 mg | ORAL_TABLET | Freq: Two times a day (BID) | ORAL | 3 refills | Status: DC

## 2015-10-16 MED ORDER — ENALAPRIL MALEATE 20 MG PO TABS: 20.0000 mg | ORAL_TABLET | Freq: Two times a day (BID) | ORAL | 3 refills | Status: DC

## 2015-10-16 MED ORDER — AMLODIPINE BESYLATE 10 MG PO TABS: 10.0000 mg | ORAL_TABLET | Freq: Every day | ORAL | 3 refills | Status: DC

## 2015-10-16 MED ORDER — FUROSEMIDE 40 MG PO TABS: 40.0000 mg | ORAL_TABLET | Freq: Every day | ORAL | 3 refills | Status: DC

## 2015-10-16 MED ORDER — CARVEDILOL 25 MG PO TABS: 25.0000 mg | ORAL_TABLET | Freq: Two times a day (BID) | ORAL | 3 refills | Status: DC

## 2015-10-16 MED ORDER — POTASSIUM CHLORIDE CRYS ER 20 MEQ PO TBCR: 20.0000 meq | EXTENDED_RELEASE_TABLET | Freq: Every day | ORAL | 3 refills | Status: DC

## 2015-10-16 MED ORDER — SIMVASTATIN 20 MG PO TABS: 20.0000 mg | ORAL_TABLET | Freq: Every day | ORAL | 3 refills | Status: DC

## 2015-10-16 NOTE — Patient Instructions (Signed)
Medication Instructions:  Continue current medication  Labwork: NONE  Testing/Procedures: Your physician has requested that you have an echocardiogram. Echocardiography is a painless test that uses sound waves to create images of your heart. It provides your doctor with information about the size and shape of your heart and how well your heart's chambers and valves are working. This procedure takes approximately one hour. There are no restrictions for this procedure.  Follow-Up: Your physician wants you to follow-up in: 1 Year. You will receive a reminder letter in the mail two months in advance. If you don't receive a letter, please call our office to schedule the follow-up appointment.   Any Other Special Instructions Will Be Listed Below (If Applicable).   If you need a refill on your cardiac medications before your next appointment, please call your pharmacy.

## 2015-10-17 ENCOUNTER — Encounter (HOSPITAL_COMMUNITY): Payer: Self-pay | Admitting: Psychiatry

## 2015-10-17 ENCOUNTER — Ambulatory Visit (HOSPITAL_COMMUNITY): Payer: Self-pay | Admitting: Psychiatry

## 2015-10-17 VITALS — BP 128/74 | HR 106 | Ht 63.0 in | Wt 298.6 lb

## 2015-10-17 DIAGNOSIS — F332 Major depressive disorder, recurrent severe without psychotic features: Secondary | ICD-10-CM

## 2015-10-18 MED ORDER — CLONAZEPAM 1 MG PO TABS: 1.0000 mg | ORAL_TABLET | Freq: Two times a day (BID) | ORAL | 1 refills | Status: DC

## 2015-10-18 NOTE — Progress Notes (Unsigned)
BH MD/PA/NP OP Progress Note  10/18/2015 6:38 PM [name]  MRN:  [id number]  Chief Complaint: patient returns for medication management appointment  Subjective:   Reports she has been doing relatively well, although reports some ongoing , chronic depression  and anxiety in the context of stressors . Denies medication side effects. Objective : Patient returns for follow up- last seen several months ago.  She reports she continues to feel depressed, anxious, related to chronic stressors. Mainly these are family stressors, her husband often travels out of the country without her knowledge,leaving her the responsibilities of managing home, finances and their teenaged son, who has learning disabilities . Another stressor is that her PCP of many years retired and she had a strong therapeutic alliance with him. States " the new Doctor did not want to continue some of the medications ".  Reports she has recently had some increased back pain, due to which she was prescribed oxycodone , which she states she has been taking occasionally when her back pain intensifies . States has not taken recently . In spite of above stressors, states that she has been functioning well in her daily activities, taking care of her home, helping her son transition to another school, and recently travelling to [facility] to see her parents and family Does not endorse any major neuro-vegetative symptoms of depression , although states energy level tends to be low .  Denies suicidal ideations, denies any homicidal ideations  Presents with reactive affect, smiles at times appropriately .   Visit Diagnosis:  History of MDD  Past Psychiatric History: Depression , Anxiety   Past Medical History:  Past Medical History:  Diagnosis Date  . ADHD   . Anxiety   . CARDIOMYOPATHY, PERIPARTUM, POSTPARTUM    a. EF previously 30-40%,  b.  resolved, EF 60%    c.  echo 10/2011:  mild LVH, EF 50%, mild LAE,   . CARPAL TUNNEL SYNDROME, RIGHT    . CHF (congestive heart failure) ([place])   . Chronic systolic CHF (congestive heart failure), NYHA class 3 ([place]) 12/06/2013  . DEPRESSION   . Edema    a. LE venous dopplers negative for DVT 10/2011  . Essential hypertension, benign   . MIGRAINE HEADACHE   . OBESITY   . Sleep apnea     Past Surgical History:  Procedure Laterality Date  . child birth  2003  . CHOLECYSTECTOMY        Family History:  Family History  Problem Relation Age of Onset  . Depression Mother   . Anxiety disorder Mother   . ADD / ADHD Father   . Hypertension Other   . Diabetes Other   . Colitis Other   . Alcohol abuse Other     Social History:  Social History   Social History  . Marital status: Married    Spouse name: N/A  . Number of children: N/A  . Years of education: N/A   Social History Main Topics  . Smoking status: Current Every Day Smoker    Packs/day: 0.50    Types: Cigarettes    Last attempt to quit: 02/25/1997  . Smokeless tobacco: None     Comment: Married, lives with spouse (when he is not traveling) and son  . Alcohol use No  . Drug use: No  . Sexual activity: Not Currently   Other Topics Concern  . None   Social History Narrative  . None    Allergies:  Allergies  Allergen Reactions  .  Cymbalta [Duloxetine Hcl] Other (See Comments)    depressed  . Duloxetine     REACTION: Sucidal thoughts  . Penicillins     REACTION: rash  . Prozac [Fluoxetine Hcl] Other (See Comments)    More depressed    Metabolic Disorder Labs: No results found for: HGBA1C, MPG No results found for: PROLACTIN Lab Results  Component Value Date   CHOL 155 11/08/2013   TRIG 148 11/08/2013   HDL 47 11/08/2013   CHOLHDL 3.3 11/08/2013   VLDL 30 11/08/2013   LDLCALC 78 11/08/2013   LDLCALC 148 (H) 05/09/2008     Current Medications: Current Outpatient Prescriptions  Medication Sig Dispense Refill  . amLODipine (NORVASC) 10 MG tablet Take 1 tablet (10 mg total) by mouth daily. 90 tablet  3  . amphetamine-dextroamphetamine (ADDERALL XR) 30 MG 24 hr capsule Take 1 capsule (30 mg total) by mouth daily. Ok to fill on 30 days after rx 30 capsule 0  . busPIRone (BUSPAR) 10 MG tablet TAKE 1 TABLET TWICE A DAY 180 tablet 0  . carvedilol (COREG) 12.5 MG tablet Take 1 tablet (12.5 mg total) by mouth 2 (two) times daily with a meal. 180 tablet 3  . carvedilol (COREG) 25 MG tablet Take 1 tablet (25 mg total) by mouth 2 (two) times daily with a meal. 180 tablet 3  . clonazePAM (KLONOPIN) 1 MG tablet TAKE 1 TABLET BY MOUTH 3 TIMES A DAY AS NEEDED FOR ANXIETY 90 tablet 1  . cyclobenzaprine (FLEXERIL) 10 MG tablet Take 1/2 pill twice a day as needed for muscle spasm 30 tablet 0  . enalapril (VASOTEC) 20 MG tablet Take 1 tablet (20 mg total) by mouth 2 (two) times daily. 180 tablet 3  . furosemide (LASIX) 40 MG tablet Take 1 tablet (40 mg total) by mouth daily. 90 tablet 3  . ibuprofen (ADVIL,MOTRIN) 800 MG tablet Take 800 mg by mouth every 8 (eight) hours as needed for headache or mild pain.     [name] lamoTRIgine (LAMICTAL) 200 MG tablet TAKE 1 TABLET EVERY DAY 90 tablet 0  . oxycodone-acetaminophen (PERCOCET) 2.5-325 MG tablet Take 1 tablet by mouth every 6 (six) hours as needed for pain. 30 tablet 0  . oxyCODONE-acetaminophen (PERCOCET) 7.5-325 MG tablet Take 1 tablet by mouth every 4 (four) hours as needed for severe pain.    . potassium chloride SA (KLOR-CON M20) 20 MEQ tablet Take 1 tablet (20 mEq total) by mouth daily. 90 tablet 3  . sertraline (ZOLOFT) 100 MG tablet TAKE 1 TABLET TWICE A DAY 180 tablet 0  . simvastatin (ZOCOR) 20 MG tablet Take 1 tablet (20 mg total) by mouth daily at 6 PM. 90 tablet 3   No current facility-administered medications for this visit.     Neurologic: Headache: Negative Seizure: Negative Paresthesias: Negative  Musculoskeletal: Strength & Muscle Tone: within normal limits Gait & Station: normal Patient leans: N/A  Psychiatric Specialty Exam: ROS no  chest pain or shortness of breath at room air at this time, recent back pain, now improving   Blood pressure 128/74, pulse (!) 106, height 5\' 3"  (1.6 m), weight 298 lb 9.6 oz (135.4 kg).Body mass index is 52.89 kg/m.  General Appearance: Well Groomed  Eye Contact:  Good  Speech:  Normal Rate  Volume:  Normal  Mood:  reports feeling some depression which she attributes to stressors   Affect:  Appropriate and reactive, smiles often and appropriately   Thought Process:  Goal Directed  Orientation:  Full (Time, Place, and Person)  Thought Content: no hallucinations, no delusions , not internally preoccupied    Suicidal Thoughts:  No denies any suicidal or self injurious ideations, has established her son as a protective factor from considering suicide   Homicidal Thoughts:  No denies violent or homicidal ideations   Memory:  recent and remote grossly intact   Judgement:  Other:  good   Insight:  Good  Psychomotor Activity:  Normal  Concentration:  Concentration: Good and Attention Span: Good  Recall:  Good  Fund of Knowledge: Good  Language: Good  Akathisia:  Negative  Handed:  Right  AIMS (if indicated):  No abnormal involuntary movements noted or reported  Assets:  Desire for Improvement Resilience  ADL's:  Intact  Cognition: WNL  Sleep:   Stable    Assessment - patient functioning well in daily activities at this time, reports some chronic depression and anxiety related mainly to marital stressors. Her husband works in [employer], is from another country, and often travels abroad without telling her, leaving her with home [place] responsibilities . She presents , however, euthymic, with a reactive , full range of affect . Denies medication side effects. *Of note, states she was recently prescribed Percocet for back pain, although not taking regularly . I have cautioned her about risk of accidental overdose and excessive sedation particularly as she is treated with Klonopin- patient  expresses understanding of not combining these medications   Treatment Plan Summary:Medication management and Plan will see in one month . Patient agrees to contact me sooner if any worsening prior . No medication changes at this time- patient does not need script at this time . Patient instructed not to combine BZD with Opiate analgesic- expresses understanding- see above  Of note, currently taking Klonopin 1 mgr BID  Of note, patient now OFF Adderall, which had been prescribed by PCP, states new PCP did not want to continue this medication .    Neita Garnet, MD 10/18/2015, 6:38 PM

## 2015-10-24 ENCOUNTER — Other Ambulatory Visit: Payer: Self-pay | Admitting: Family Medicine

## 2015-10-25 ENCOUNTER — Other Ambulatory Visit: Payer: Self-pay | Admitting: Emergency Medicine

## 2015-10-25 ENCOUNTER — Telehealth: Payer: Self-pay | Admitting: Cardiology

## 2015-10-25 MED ORDER — IBUPROFEN 800 MG PO TABS: 800.0000 mg | ORAL_TABLET | Freq: Three times a day (TID) | ORAL | 1 refills | Status: DC | PRN

## 2015-10-25 NOTE — Telephone Encounter (Signed)
I did send my note but also sent a staff message today.

## 2015-10-25 NOTE — Telephone Encounter (Signed)
Returned patient call:  Patient reports she had OV with MD Hochrein on 8/21 and had a discussion with him regarding this issue and wanted to follow up.    Reports she use to see MD Jobe Igo for PCP for 13 years but he moved to [facility] and was transferred over to MD Copland.  Pt reports she recently went to see MD Copland for her back issues/pain and MD did not want to give her her usual medication for pain that MD Jobe Igo use to give her (toradol) d/t cardiomyopathy. Reports MD Jobe Igo use to give this to her  if needed.  Pt reports she discussed these medications with MD Hochrein and he approved of her taking them and was informed he would contact MD Copland to inform her of his approval.    Pt wanting to know if MD Hochrein has contacted MD Copland to let her know he approved of this medication and if he had a recommendation of another PCP that would be a good fit for her.    Advised pt I would send message to MD [facility] for advice and follow up.

## 2015-10-25 NOTE — Telephone Encounter (Signed)
Received refill request for IBUPROFEN 800 MG TABLET. Last office visit 08/31/15 and last refill 05/14/2013. Will it be ok to refill? Please advise.

## 2015-10-25 NOTE — Telephone Encounter (Signed)
Received call from patient-made aware that MD Hochrein has sent his note and a message to PCP today.  Pt verbalized understanding.

## 2015-10-25 NOTE — Telephone Encounter (Signed)
Pt wants to know if Dr Warren Lacy have contacted Dr Radene Ou primary doctor? This was regarding medications that he thought she should continue to take that was prescribed previously by Dr Lysle Pearl.Also would like Dr Warren Lacy to recommend another primary doctor.Please call pt for further details.

## 2015-10-25 NOTE — Telephone Encounter (Signed)
Recent note from cardiology- ok to use NSAIDs for her as long as no sx of CHF

## 2015-10-25 NOTE — Telephone Encounter (Signed)
Attempt to call patient-no answer, lmtcb. 

## 2015-10-26 ENCOUNTER — Other Ambulatory Visit: Payer: Self-pay

## 2015-10-26 ENCOUNTER — Ambulatory Visit (HOSPITAL_COMMUNITY): Payer: Medicare Other | Attending: Cardiology

## 2015-10-26 DIAGNOSIS — I110 Hypertensive heart disease with heart failure: Secondary | ICD-10-CM | POA: Insufficient documentation

## 2015-10-26 DIAGNOSIS — E785 Hyperlipidemia, unspecified: Secondary | ICD-10-CM | POA: Diagnosis not present

## 2015-10-26 DIAGNOSIS — Z720 Tobacco use: Secondary | ICD-10-CM | POA: Insufficient documentation

## 2015-10-26 DIAGNOSIS — I429 Cardiomyopathy, unspecified: Secondary | ICD-10-CM

## 2015-10-26 DIAGNOSIS — I509 Heart failure, unspecified: Secondary | ICD-10-CM | POA: Diagnosis not present

## 2015-10-26 DIAGNOSIS — Z6841 Body Mass Index (BMI) 40.0 and over, adult: Secondary | ICD-10-CM | POA: Diagnosis not present

## 2015-10-26 DIAGNOSIS — R0602 Shortness of breath: Secondary | ICD-10-CM | POA: Diagnosis not present

## 2015-11-07 DIAGNOSIS — G959 Disease of spinal cord, unspecified: Secondary | ICD-10-CM | POA: Diagnosis not present

## 2015-11-07 DIAGNOSIS — M545 Low back pain: Secondary | ICD-10-CM | POA: Diagnosis not present

## 2015-11-07 DIAGNOSIS — M542 Cervicalgia: Secondary | ICD-10-CM | POA: Diagnosis not present

## 2015-11-08 ENCOUNTER — Other Ambulatory Visit: Payer: Self-pay | Admitting: Orthopedic Surgery

## 2015-11-08 DIAGNOSIS — M545 Low back pain: Secondary | ICD-10-CM

## 2015-11-08 DIAGNOSIS — G959 Disease of spinal cord, unspecified: Secondary | ICD-10-CM

## 2015-11-08 DIAGNOSIS — M546 Pain in thoracic spine: Secondary | ICD-10-CM

## 2015-11-14 ENCOUNTER — Ambulatory Visit (HOSPITAL_COMMUNITY): Payer: Self-pay | Admitting: Psychiatry

## 2015-12-28 ENCOUNTER — Telehealth: Payer: Self-pay | Admitting: Cardiology

## 2015-12-28 NOTE — Telephone Encounter (Signed)
Patient states someone called while she was on the phone.  rn informed patient not sure who called but they should call back if needed

## 2015-12-28 NOTE — Telephone Encounter (Signed)
Follow up   Pt verbalized that someone called her and she is returning the call

## 2015-12-28 NOTE — Telephone Encounter (Signed)
Spoke to patient - instruction given to make an appointment with an extender.or see a primary. RN  discussed with patient of her options- office visit ,or hospital visit , if needed. Patient states she does not have a primary . They left town. Patient still adamant in seeing Dr Everlean Alstrom spent 15 minutes discussing with patient  - the options for assistance. - see an extender tomorrow -go to ER if symptoms are concerning and she can not wait - offered an appointment later in the month with Dr Percival Spanish. Patient decide to take the appointment and schedule an appt later this month.appointment made.

## 2015-12-28 NOTE — Telephone Encounter (Signed)
Spoke to patient  C/o fullness in abdomen for the last week to week in half. States difficulty in stomach  Or drinking. Difficulty to get symptoms from patient - has not weight herself Has not checked blood pressures. - unable to give precise days that have taken extra furosemide- only states she has taken every other day but not sure how long . - states she under stress , been home with sonl( Asperger) lately by herself, Patient request to se Dr Percival Spanish only Offered appointmtent with extenders  Today or tomorrow. Patient decline,wanted RN to review with Dr Percival Spanish. WILL DISCUSS WITH Dr Percival Spanish and patient back

## 2015-12-28 NOTE — Telephone Encounter (Signed)
New message  Pt's husband calling  Wife has harding of the stomach after eating tiny amounts/effects breathing  Wants a call back

## 2015-12-29 ENCOUNTER — Encounter: Payer: Self-pay | Admitting: Physician Assistant

## 2015-12-29 ENCOUNTER — Ambulatory Visit (INDEPENDENT_AMBULATORY_CARE_PROVIDER_SITE_OTHER): Payer: Medicare Other | Admitting: Physician Assistant

## 2015-12-29 VITALS — BP 120/76 | HR 78 | Ht 63.0 in | Wt 297.0 lb

## 2015-12-29 DIAGNOSIS — R1010 Upper abdominal pain, unspecified: Secondary | ICD-10-CM | POA: Diagnosis not present

## 2015-12-29 DIAGNOSIS — I5023 Acute on chronic systolic (congestive) heart failure: Secondary | ICD-10-CM | POA: Diagnosis not present

## 2015-12-29 DIAGNOSIS — O903 Peripartum cardiomyopathy: Secondary | ICD-10-CM | POA: Diagnosis not present

## 2015-12-29 DIAGNOSIS — Z23 Encounter for immunization: Secondary | ICD-10-CM

## 2015-12-29 DIAGNOSIS — I10 Essential (primary) hypertension: Secondary | ICD-10-CM | POA: Diagnosis not present

## 2015-12-29 MED ORDER — DOCUSATE SODIUM 100 MG PO CAPS: ORAL_CAPSULE | ORAL | 4 refills | Status: DC

## 2015-12-29 MED ORDER — FUROSEMIDE 40 MG PO TABS: 40.0000 mg | ORAL_TABLET | Freq: Every day | ORAL | 3 refills | Status: DC

## 2015-12-29 NOTE — Patient Instructions (Addendum)
Medication Instructions:  START Colace 100mg  Take 1-2 tabs by mouth twice a day You can take 1 extra 40mg  tablet of Lasix as needed   Labwork: Your physician recommends that you return for lab work in: TODAY-BNP, BMP  Testing/Procedures: None   Follow-Up: Your physician recommends that you schedule a follow-up appointment in: 1st Available WITH DR [facility]  Any Other Special Instructions Will Be Listed Below (If Applicable).  1. CONTINUE TO CHECK YOUR WEIGHT DAILY 2. Referral to GI to Dr Rod Mae ph. [phone]  If you need a refill on your cardiac medications before your next appointment, please call your pharmacy.

## 2015-12-29 NOTE — Progress Notes (Signed)
Cardiology Office Note   Date:  12/29/2015   ID:  [name], DOB 1965/04/16, MRN [id number]  PCP:  Samantha Blinks, MD  Cardiologist:  Dr [name] 10/16/2015  Samantha Ferries, PA-C   Chief Complaint  Patient presents with  . Shortness of Breath    stomach pain    History of Present Illness: [name] is a 50 y.o. female with a history of NICM w/ peripartum EF 30-40%>>50% in 2013>>30-35% 09/2015 echo, ADHD, S-D-CHF, depression, HTN, OSA, obesity.  [name] presents for Evaluation of her volume status.  [name] has chronic social stressors because she is the primary caregiver for her son who has asked burgers. Her husband was out of the country for prolonged periods of time, and she does not have a lot of help locally. He is now back and she is able to get some of her issues addressed.  She has daytime edema. She was having some nighttime edema, but took the extra Lasix for the last 5/7 days. Her lower extremity edema has improved. She states that her legs become just huge during the day, but she is not having any lower extremity edema at this time and it is in the afternoon. She has not been able to wear compression socks but the knee-high stockings that she has on now have a good amount of elastic in them. There is minimal edema present.  She describes significant dyspnea on exertion. She states that she can't walk from the bed to the bathroom which is only about 10 feet, without getting short of breath. However, she is able to care for her son, and take care of the household. She has dyspnea with this but just stops to rest when she needs to. Her dyspnea becomes much worse after meals. Most of her problems seen potentiated by GI issues.  What had been happening on an intermittent basis, but is now daily, is that when she eats, her upper abdomen will become very firm and hard. She states that she is eating very little because of this (but has not lost any weight). She drinks  water to take her pills, and even has some affects with just the water. Her stomach will get firm and hard and make her feel short of breath after she eats. This effect will last several hours.  She also reports problems with constipation. Her stools have sometimes been hard and difficult to pass. Other stools have been like "peanut butter". She has not seen any blood.  She saw [name] in [place] a couple of years ago, and was supposed to have a colonoscopy. However, because her husband was once again out of town, she never had the procedure done. She is willing to have the evaluation performed now.   Past Medical History:  Diagnosis Date  . ADHD   . Anxiety   . CARDIOMYOPATHY, PERIPARTUM, POSTPARTUM    a. EF previously 30-40%,  b.  resolved, EF 60%    c.  echo 10/2011:  mild LVH, EF 50%, mild LAE,   . CARPAL TUNNEL SYNDROME, RIGHT   . CHF (congestive heart failure) ([place])   . Chronic systolic CHF (congestive heart failure), NYHA class 3 ([place]) 12/06/2013  . DEPRESSION   . Edema    a. LE venous dopplers negative for DVT 10/2011  . Essential hypertension, benign   . MIGRAINE HEADACHE   . OBESITY   . Sleep apnea     Past Surgical History:  Procedure Laterality Date  .  child birth  2003  . CHOLECYSTECTOMY      Current Outpatient Prescriptions  Medication Sig Dispense Refill  . amLODipine (NORVASC) 10 MG tablet Take 1 tablet (10 mg total) by mouth daily. 90 tablet 3  . amphetamine-dextroamphetamine (ADDERALL XR) 30 MG 24 hr capsule Take 1 capsule by mouth daily.    . busPIRone (BUSPAR) 10 MG tablet TAKE 1 TABLET TWICE A DAY 180 tablet 0  . carvedilol (COREG) 12.5 MG tablet Take 1 tablet (12.5 mg total) by mouth 2 (two) times daily with a meal. 180 tablet 3  . carvedilol (COREG) 25 MG tablet Take 1 tablet (25 mg total) by mouth 2 (two) times daily with a meal. 180 tablet 3  . clonazePAM (KLONOPIN) 1 MG tablet Take 1 tablet (1 mg total) by mouth 2 (two) times daily. 90 tablet 1  .  cyclobenzaprine (FLEXERIL) 10 MG tablet Take 1/2 pill twice a day as needed for muscle spasm 30 tablet 0  . enalapril (VASOTEC) 20 MG tablet Take 1 tablet (20 mg total) by mouth 2 (two) times daily. 180 tablet 3  . furosemide (LASIX) 40 MG tablet Take 1 tablet (40 mg total) by mouth daily. 90 tablet 3  . ibuprofen (ADVIL,MOTRIN) 800 MG tablet Take 1 tablet (800 mg total) by mouth every 8 (eight) hours as needed for headache or mild pain. 30 tablet 1  . lamoTRIgine (LAMICTAL) 200 MG tablet TAKE 1 TABLET EVERY DAY 90 tablet 0  . oxycodone-acetaminophen (PERCOCET) 2.5-325 MG tablet Take 1 tablet by mouth every 6 (six) hours as needed for pain. 30 tablet 0  . oxyCODONE-acetaminophen (PERCOCET) 7.5-325 MG tablet Take 1 tablet by mouth every 4 (four) hours as needed for severe pain.    . potassium chloride SA (KLOR-CON M20) 20 MEQ tablet Take 1 tablet (20 mEq total) by mouth daily. 90 tablet 3  . sertraline (ZOLOFT) 100 MG tablet TAKE 1 TABLET TWICE A DAY 180 tablet 0  . simvastatin (ZOCOR) 20 MG tablet Take 1 tablet (20 mg total) by mouth daily at 6 PM. 90 tablet 3   No current facility-administered medications for this visit.     Allergies:   Cymbalta [duloxetine hcl]; Duloxetine; Penicillins; and Prozac [fluoxetine hcl]    Social History:  The patient  reports that she has been smoking Cigarettes.  She has been smoking about 0.50 packs per day. She has never used smokeless tobacco. She reports that she does not drink alcohol or use drugs.   Family History:  The patient's family history includes ADD / ADHD in her father; Alcohol abuse in her other; Anxiety disorder in her mother; Colitis in her other; Depression in her mother; Diabetes in her other; Hypertension in her other.    ROS:  Please see the history of present illness. All other systems are reviewed and negative.    PHYSICAL EXAM: VS:  BP 120/76   Pulse 78   Ht 5\' 3"  (1.6 m)   Wt 297 lb (134.7 kg)   BMI 52.61 kg/m  , BMI Body mass  index is 52.61 kg/m. GEN: Well nourished, well developed, female in no acute distress  HEENT: normal for age  Neck: no JVD, no carotid bruit, no masses Cardiac: RRR; Soft murmur, no rubs, or gallops Respiratory: Decreased breath sounds bases but clear to auscultation bilaterally, normal work of breathing GI: soft, nontender, nondistended, + BS Samantha: no deformity or atrophy; minimal trace edema; distal pulses are 2+ in all 4 extremities   Skin:  warm and dry, no rash Neuro:  Strength and sensation are intact Psych: euthymic mood, full affect   EKG:  EKG is ordered today. The ekg ordered today demonstrates sinus rhythm, heart rate 78, meets criteria for LVH  ECHO: 10/26/2015 - Left ventricle: The cavity size was mildly dilated. Wall   thickness was increased in a pattern of mild LVH. Systolic   function was moderately to severely reduced. The estimated   ejection fraction was in the range of 30% to 35%. Diffuse   hypokinesis. Features are consistent with a pseudonormal left   ventricular filling pattern, with concomitant abnormal relaxation   and increased filling pressure (grade 2 diastolic dysfunction). - Aortic valve: There was no stenosis. - Mitral valve: There was no significant regurgitation. - Right ventricle: The cavity size was normal. Systolic function   was normal. - Pulmonary arteries: No complete TR doppler jet so unable to   estimate PA systolic pressure. - Systemic veins: IVC poorly visualized. Impressions: - Mildly dilated LV with mild LV hypertrophy. EF 30-35%, diffuse   hypokinesis. Moderate diastolic dysfunction. Normal RV size and   systolic function. No significant valvular abnormalities.    Recent Labs: 07/03/2015: B Natriuretic Peptide 243.9; Hemoglobin 12.7; Platelets 243 07/05/2015: ALT 16; BUN 12; Creatinine, Ser 0.80; Potassium 3.7; Sodium 142    Lipid Panel    Component Value Date/Time   CHOL 155 11/08/2013 1019   TRIG 148 11/08/2013 1019   HDL  47 11/08/2013 1019   CHOLHDL 3.3 11/08/2013 1019   VLDL 30 11/08/2013 1019   LDLCALC 78 11/08/2013 1019   LDLDIRECT 143.8 04/03/2011 0929     Wt Readings from Last 3 Encounters:  12/29/15 297 lb (134.7 kg)  10/16/15 294 lb 9.6 oz (133.6 kg)  08/31/15 284 lb (128.8 kg)     Other studies Reviewed: Additional studies/ records that were reviewed today include: Office notes and testing.  ASSESSMENT AND PLAN:  1.  Acute on chronic combined systolic and diastolic CHF: Although her weight is up, she is not severely volume overloaded by exam. We will check a BMET and a BNP. She is advised that it is okay for her to continue doing the extra Lasix tablet daily as needed for lower extremity edema. She was also advised that 80 mg of Lasix a day is not an extremely high dose and if that is how much it takes to control her volume, that will be okay. She ambulated with staff in the office, and was able to go several hundred feet. She felt short of breath, but her oxygen saturation remained 95% or better.  2. GI issues: It seems that her GI issues are affecting her cardiac issues. We will refer her back to [name] to continue her GI evaluation which was incomplete cause of family issues. She is encouraged to eat small frequent meals. Her GI exam today was essentially benign.  3. Gen. health care: She requested flu vaccine today and one was given  Current medicines are reviewed at length with the patient today.  The patient has concerns regarding medicines. Concerns were addressed  The following changes have been made:  Give her extra Lasix tablets so she can take 2 a day as needed.  Labs/ tests ordered today include:   Orders Placed This Encounter  Procedures  . Flu Vaccine QUAD 36+ mos IM  . Basic metabolic panel  . Brain natriuretic peptide  . Ambulatory referral to Gastroenterology  . EKG 12-Lead  Disposition:   FU with Dr. Percival [name]  Signed, Samantha Ferries, PA-C  12/29/2015 5:17 PM     [place] Phone: [phone]; Fax: [phone]  This note was written with the assistance of speech recognition software. Please excuse any transcriptional errors.

## 2016-01-02 ENCOUNTER — Encounter: Payer: Self-pay | Admitting: Cardiology

## 2016-01-02 ENCOUNTER — Telehealth: Payer: Self-pay | Admitting: Cardiology

## 2016-01-02 NOTE — Telephone Encounter (Signed)
Left message for patient to call back to schedule next available appt with Dr. Percival Spanish.  Also, patient has been referred to Dr. Rod Mae at [place] [phone]).  Patient should be able to call and schedule appt.  I tried to call twice and was on hold for long periods of time and did not get through.

## 2016-01-08 ENCOUNTER — Ambulatory Visit: Payer: Medicare Other | Admitting: Cardiology

## 2016-01-15 NOTE — Telephone Encounter (Signed)
Follow up    Pt verbalized that she is calling for rn

## 2016-01-16 DIAGNOSIS — R197 Diarrhea, unspecified: Secondary | ICD-10-CM | POA: Diagnosis not present

## 2016-01-16 DIAGNOSIS — R109 Unspecified abdominal pain: Secondary | ICD-10-CM | POA: Diagnosis not present

## 2016-01-16 DIAGNOSIS — K589 Irritable bowel syndrome without diarrhea: Secondary | ICD-10-CM | POA: Diagnosis not present

## 2016-01-16 DIAGNOSIS — K5900 Constipation, unspecified: Secondary | ICD-10-CM | POA: Diagnosis not present

## 2016-01-17 DIAGNOSIS — R6881 Early satiety: Secondary | ICD-10-CM | POA: Diagnosis not present

## 2016-01-17 DIAGNOSIS — K219 Gastro-esophageal reflux disease without esophagitis: Secondary | ICD-10-CM | POA: Diagnosis not present

## 2016-01-17 DIAGNOSIS — K760 Fatty (change of) liver, not elsewhere classified: Secondary | ICD-10-CM | POA: Diagnosis not present

## 2016-01-17 DIAGNOSIS — K5900 Constipation, unspecified: Secondary | ICD-10-CM | POA: Diagnosis not present

## 2016-01-17 DIAGNOSIS — Z9049 Acquired absence of other specified parts of digestive tract: Secondary | ICD-10-CM | POA: Diagnosis not present

## 2016-01-17 DIAGNOSIS — K921 Melena: Secondary | ICD-10-CM | POA: Diagnosis not present

## 2016-01-17 DIAGNOSIS — R140 Abdominal distension (gaseous): Secondary | ICD-10-CM | POA: Diagnosis not present

## 2016-01-17 DIAGNOSIS — R188 Other ascites: Secondary | ICD-10-CM | POA: Diagnosis not present

## 2016-01-24 DIAGNOSIS — H5203 Hypermetropia, bilateral: Secondary | ICD-10-CM | POA: Diagnosis not present

## 2016-01-24 DIAGNOSIS — H52221 Regular astigmatism, right eye: Secondary | ICD-10-CM | POA: Diagnosis not present

## 2016-01-24 DIAGNOSIS — H35033 Hypertensive retinopathy, bilateral: Secondary | ICD-10-CM | POA: Diagnosis not present

## 2016-01-24 DIAGNOSIS — H524 Presbyopia: Secondary | ICD-10-CM | POA: Diagnosis not present

## 2016-01-24 DIAGNOSIS — H04203 Unspecified epiphora, bilateral lacrimal glands: Secondary | ICD-10-CM | POA: Diagnosis not present

## 2016-01-25 ENCOUNTER — Telehealth: Payer: Self-pay | Admitting: Cardiology

## 2016-01-25 DIAGNOSIS — O903 Peripartum cardiomyopathy: Secondary | ICD-10-CM

## 2016-01-25 DIAGNOSIS — K760 Fatty (change of) liver, not elsewhere classified: Secondary | ICD-10-CM | POA: Diagnosis not present

## 2016-01-25 DIAGNOSIS — R160 Hepatomegaly, not elsewhere classified: Secondary | ICD-10-CM | POA: Diagnosis not present

## 2016-01-25 DIAGNOSIS — R1010 Upper abdominal pain, unspecified: Secondary | ICD-10-CM

## 2016-01-25 DIAGNOSIS — I5023 Acute on chronic systolic (congestive) heart failure: Secondary | ICD-10-CM

## 2016-01-25 DIAGNOSIS — I10 Essential (primary) hypertension: Secondary | ICD-10-CM

## 2016-01-25 NOTE — Telephone Encounter (Signed)
Spoke with Raiann at [facility] she states that she dos not see that pt was there 01-17-16 and will need to come in for re-draw and labs have to be re-ordered.  Lab re-ordered  Left detailed message for pt to come in for re-draw before Tuesday so lab will be back for appt

## 2016-01-25 NOTE — Telephone Encounter (Signed)
New Message ° °Pt call requesting to speak with RN about lab results. Please call back to discuss  °

## 2016-01-25 NOTE — Telephone Encounter (Signed)
Spoke with pt states that she is call ing for her lab results-she states that she went to the lab 01-17-16  I do not see that they have been drawn. LMTCB at [facility] lab to see what happened.

## 2016-02-01 NOTE — Progress Notes (Deleted)
HPI The patient presents for evaluation of edema and palpitations and cardiomyopathy.   Her last EF was 35%.  She did have a stress perfusion study that did not demonstrate ischemia in 2015.  She had a sleep study.  She has been diagnosed with sleep apnea..  She has many issues with anxiety and somatic complaints.  She has significant complaints of back pain neck pain and neuropathy.  She recently saw Rosaria Ferries PAc.  ***   She talked at great length about this today. She says that she's not been given some of her pain medications because of her history of heart failure. When we finally started talking about cardiac issues she doesn't seem to have any acute complaints. However, I was able to review and ER visit from May of this year. She had some dyspnea and a very mildly elevated BNP although there was no other evidence of edema. She's not describing any new PND or orthopnea. She's not having any palpitations, presyncope or syncope. She only had lower extremity swelling she had to drive to [place] recently.  Allergies  Allergen Reactions  . Cymbalta [Duloxetine Hcl] Other (See Comments)    depressed  . Duloxetine     REACTION: Sucidal thoughts  . Penicillins     REACTION: rash  . Prozac [Fluoxetine Hcl] Other (See Comments)    More depressed    Current Outpatient Prescriptions  Medication Sig Dispense Refill  . amLODipine (NORVASC) 10 MG tablet Take 1 tablet (10 mg total) by mouth daily. 90 tablet 3  . amphetamine-dextroamphetamine (ADDERALL XR) 30 MG 24 hr capsule Take 1 capsule by mouth daily.    . busPIRone (BUSPAR) 10 MG tablet TAKE 1 TABLET TWICE A DAY 180 tablet 0  . carvedilol (COREG) 12.5 MG tablet Take 1 tablet (12.5 mg total) by mouth 2 (two) times daily with a meal. 180 tablet 3  . carvedilol (COREG) 25 MG tablet Take 1 tablet (25 mg total) by mouth 2 (two) times daily with a meal. 180 tablet 3  . clonazePAM (KLONOPIN) 1 MG tablet Take 1 tablet (1 mg total) by mouth 2  (two) times daily. 90 tablet 1  . cyclobenzaprine (FLEXERIL) 10 MG tablet Take 1/2 pill twice a day as needed for muscle spasm 30 tablet 0  . docusate sodium (COLACE) 100 MG capsule Take 1-2 tabs by mouth twice a day 120 capsule 4  . enalapril (VASOTEC) 20 MG tablet Take 1 tablet (20 mg total) by mouth 2 (two) times daily. 180 tablet 3  . furosemide (LASIX) 40 MG tablet Take 1 tablet (40 mg total) by mouth daily. Ok to take a extra tab daily as needed 180 tablet 3  . ibuprofen (ADVIL,MOTRIN) 800 MG tablet Take 1 tablet (800 mg total) by mouth every 8 (eight) hours as needed for headache or mild pain. 30 tablet 1  . lamoTRIgine (LAMICTAL) 200 MG tablet TAKE 1 TABLET EVERY DAY 90 tablet 0  . oxycodone-acetaminophen (PERCOCET) 2.5-325 MG tablet Take 1 tablet by mouth every 6 (six) hours as needed for pain. 30 tablet 0  . oxyCODONE-acetaminophen (PERCOCET) 7.5-325 MG tablet Take 1 tablet by mouth every 4 (four) hours as needed for severe pain.    . potassium chloride SA (KLOR-CON M20) 20 MEQ tablet Take 1 tablet (20 mEq total) by mouth daily. 90 tablet 3  . sertraline (ZOLOFT) 100 MG tablet TAKE 1 TABLET TWICE A DAY 180 tablet 0  . simvastatin (ZOCOR) 20 MG tablet Take 1  tablet (20 mg total) by mouth daily at 6 PM. 90 tablet 3   No current facility-administered medications for this visit.     Past Medical History:  Diagnosis Date  . ADHD   . Anxiety   . CARDIOMYOPATHY, PERIPARTUM, POSTPARTUM    a. EF previously 30-40%,  b.  resolved, EF 60%    c.  echo 10/2011:  mild LVH, EF 50%, mild LAE,   . CARPAL TUNNEL SYNDROME, RIGHT   . CHF (congestive heart failure) ([place])   . Chronic systolic CHF (congestive heart failure), NYHA class 3 ([place]) 12/06/2013  . DEPRESSION   . Edema    a. LE venous dopplers negative for DVT 10/2011  . Essential hypertension, benign   . MIGRAINE HEADACHE   . OBESITY   . Sleep apnea     Past Surgical History:  Procedure Laterality Date  . child birth  2003  .  CHOLECYSTECTOMY     ROS:  As stated in the HPI and negative for all other systems.  PHYSICAL EXAM There were no vitals taken for this visit. GENERAL:  Well appearing NECK:  No jugular venous distention, waveform within normal limits, carotid upstroke brisk and symmetric, no bruits, no thyromegaly LUNGS:  Clear to auscultation bilaterally BACK:  No CVA tenderness CHEST:  Unremarkable HEART:  PMI not displaced or sustained,S1 and S2 within normal limits, no S3, no S4, no clicks, no rubs, no murmurs ABD:  Flat, positive bowel sounds normal in frequency in pitch, no bruits, no rebound, no guarding, no midline pulsatile mass, no hepatomegaly, no splenomegaly, obese EXT:  2 plus pulses throughout, mild to trace left greater than right lower extremity edema, no cyanosis no clubbing NEURO: Nonfocal.    EKG:  Sinus rhythm, rate ***, ectopy, axis within normal limits, poor anterior R wave progression, no acute ST-T wave changes.   07/15/15  ASSESSMENT AND PLAN  EDEMA - She will continue the meds as listed.  She seems to be euvolemic.  I will be checking an echocardiogram.  Further medical management will be based on this.   CARDIOMYOPATHY, PERIPARTUM, POSTPARTUM -  As above  ESSENTIAL HYPERTENSION, BENIGN -  This is being managed in the context of treating his CHF.  No change in therapy is indicated.   ANXIETY DEPRESSION - This is ongoing.  She continues to have active therapy for this.   APNEA - She has been diagnosed with sleep apnea. She has been prescribed CPAP  BACK PAIN - I told her I will defer management to her primary provider.  She was apparently on nonsteroidals which is helping.  These would not be absolutely contraindicated unless she has worsening dyspnea.    ED records reviewed.

## 2016-02-02 ENCOUNTER — Ambulatory Visit: Payer: Medicare Other | Admitting: Cardiology

## 2016-02-06 ENCOUNTER — Telehealth: Payer: Self-pay | Admitting: Cardiology

## 2016-02-06 NOTE — Telephone Encounter (Signed)
Left detailed message.   

## 2016-02-06 NOTE — Telephone Encounter (Signed)
Request for surgical clearance:  1. What type of surgery is being performed? Colonoscopy and Endo   2. When is this surgery scheduled? 02-12-16   3. Are there any medications that need to be held prior to surgery and how long?General Cardiac Clearance-needs this aasap  4. Name of physician performing surgery?Dr Tora Duck   5. What is your office phone and fax number? [phone] and fax is [phone]

## 2016-02-06 NOTE — Telephone Encounter (Signed)
Faxed via Epic

## 2016-02-06 NOTE — Telephone Encounter (Signed)
OK for procedure.  Need low volume prep however.

## 2016-02-12 DIAGNOSIS — Z880 Allergy status to penicillin: Secondary | ICD-10-CM | POA: Diagnosis not present

## 2016-02-12 DIAGNOSIS — R110 Nausea: Secondary | ICD-10-CM | POA: Diagnosis not present

## 2016-02-12 DIAGNOSIS — G4730 Sleep apnea, unspecified: Secondary | ICD-10-CM | POA: Diagnosis not present

## 2016-02-12 DIAGNOSIS — I10 Essential (primary) hypertension: Secondary | ICD-10-CM | POA: Diagnosis not present

## 2016-02-12 DIAGNOSIS — E7800 Pure hypercholesterolemia, unspecified: Secondary | ICD-10-CM | POA: Diagnosis not present

## 2016-02-12 DIAGNOSIS — Z79899 Other long term (current) drug therapy: Secondary | ICD-10-CM | POA: Diagnosis not present

## 2016-02-12 DIAGNOSIS — K5730 Diverticulosis of large intestine without perforation or abscess without bleeding: Secondary | ICD-10-CM | POA: Diagnosis not present

## 2016-02-12 DIAGNOSIS — I429 Cardiomyopathy, unspecified: Secondary | ICD-10-CM | POA: Diagnosis not present

## 2016-02-12 DIAGNOSIS — Z6841 Body Mass Index (BMI) 40.0 and over, adult: Secondary | ICD-10-CM | POA: Diagnosis not present

## 2016-02-12 DIAGNOSIS — Z888 Allergy status to other drugs, medicaments and biological substances status: Secondary | ICD-10-CM | POA: Diagnosis not present

## 2016-02-12 DIAGNOSIS — K648 Other hemorrhoids: Secondary | ICD-10-CM | POA: Diagnosis not present

## 2016-02-12 DIAGNOSIS — K5909 Other constipation: Secondary | ICD-10-CM | POA: Diagnosis not present

## 2016-02-12 DIAGNOSIS — K5900 Constipation, unspecified: Secondary | ICD-10-CM | POA: Diagnosis not present

## 2016-02-12 DIAGNOSIS — Z87891 Personal history of nicotine dependence: Secondary | ICD-10-CM | POA: Diagnosis not present

## 2016-02-12 DIAGNOSIS — K210 Gastro-esophageal reflux disease with esophagitis: Secondary | ICD-10-CM | POA: Diagnosis not present

## 2016-03-11 ENCOUNTER — Ambulatory Visit: Payer: Medicare Other | Admitting: Cardiology

## 2016-04-02 ENCOUNTER — Telehealth: Payer: Self-pay | Admitting: Cardiology

## 2016-04-02 NOTE — Telephone Encounter (Signed)
OK to see APP

## 2016-04-02 NOTE — Telephone Encounter (Signed)
New message     Pt c/o Shortness Of Breath: STAT if SOB developed within the last 24 hours or pt is noticeably SOB on the phone  1. Are you currently SOB (can you hear that pt is SOB on the phone)? no  2. How long have you been experiencing SOB? Over a month   3. Are you SOB when sitting or when up moving around? Yes , cant walk from bed to bathroom without SOB, cant breathe when she lays down   4. Are you currently experiencing any other symptoms?  Having pain in neck to rt shoulder and shoulder blade

## 2016-04-02 NOTE — Telephone Encounter (Signed)
New message    Pt also thinks she has pneumonia maybe? Having pain in Rt breast area? Yesterday she started getting pains on left side under breast

## 2016-04-02 NOTE — Telephone Encounter (Signed)
Pt of Dr. Percival Spanish Upcoming visit on 2/16.  Spoke to patient. She gave a very lengthy, disjointed narrative regarding her continued shortness of breath and feeling of getting tired at minimal exertion.  Regarding chest symptoms, she denies congestion, coughing, but notes pain on breathing She was very obviously breathing heavily on phone. When I asked about this, she states it has been going on for months.  She has declined interest in going to ED. She is fearful of catching flu. "Would have went to the emergency room a few times, but with the outbreak of the flu" didn't want to go.  Getting out of breath with activity. Noted 30-35% EF in August essentially unchanged from prior study. She's taking extra lasix as advised.  Informs me that as advised when seen by Suanne Marker in November, she went to GI doctor for workup, which included endo and colonoscopy, all looks OK  States "Acid reflux meds helped"  I asked about PCP follow up. She denies really having any acute issues. Also states  "I don't have a primary care". Explains her long establihsed PCP moved to [facility], and gave her name of Dr. Edilia Bo, patient only saw her twice and didn't feel like she wanted to listen to her. Never went back.  Offered APP visit this week. Patient potentially amenable to this, but strongly prefers to see Dr. Percival Spanish. Also wants recommendation for new PCP.  Will route for advice.

## 2016-04-03 ENCOUNTER — Ambulatory Visit (INDEPENDENT_AMBULATORY_CARE_PROVIDER_SITE_OTHER): Payer: Medicare Other | Admitting: Cardiology

## 2016-04-03 ENCOUNTER — Ambulatory Visit
Admission: RE | Admit: 2016-04-03 | Discharge: 2016-04-03 | Disposition: A | Discharge status: Home / Self Care | Payer: Medicare Other | Source: Ambulatory Visit | Attending: Cardiology | Admitting: Cardiology

## 2016-04-03 ENCOUNTER — Encounter: Payer: Self-pay | Admitting: Cardiology

## 2016-04-03 VITALS — BP 137/86 | HR 92 | Ht 63.0 in | Wt 313.0 lb

## 2016-04-03 DIAGNOSIS — R0602 Shortness of breath: Secondary | ICD-10-CM | POA: Diagnosis not present

## 2016-04-03 LAB — BASIC METABOLIC PANEL
BUN: 21 mg/dL (ref 7–25)
CO2: 28 mmol/L (ref 20–31)
Calcium: 9.1 mg/dL (ref 8.6–10.4)
Chloride: 103 mmol/L (ref 98–110)
Creat: 0.88 mg/dL (ref 0.50–1.05)
Glucose, Bld: 105 mg/dL — ABNORMAL HIGH (ref 65–99)
Potassium: 4.2 mmol/L (ref 3.5–5.3)
Sodium: 141 mmol/L (ref 135–146)

## 2016-04-03 LAB — CBC
HCT: 40.2 % (ref 35.0–45.0)
Hemoglobin: 12.7 g/dL (ref 11.7–15.5)
MCH: 26.1 pg — ABNORMAL LOW (ref 27.0–33.0)
MCHC: 31.6 g/dL — ABNORMAL LOW (ref 32.0–36.0)
MCV: 82.5 fL (ref 80.0–100.0)
MPV: 9.1 fL (ref 7.5–12.5)
Platelets: 265 10*3/uL (ref 140–400)
RBC: 4.87 MIL/uL (ref 3.80–5.10)
RDW: 16.1 % — ABNORMAL HIGH (ref 11.0–15.0)
WBC: 9.7 10*3/uL (ref 3.8–10.8)

## 2016-04-03 NOTE — Telephone Encounter (Signed)
[place] has open spots today. Called patient, amenable to visit this AM.  Of note, pt had a BMET and BNP drawn over a month ago, our system shows these as still active orders/never completed. She's asking if she'll need to get these redrawn prior to visit. Advised results would probably not be ready by time of visit, would just have her come to appt and provider can order needed labs. Pt voiced understanding and thanks.

## 2016-04-03 NOTE — Progress Notes (Signed)
04/03/2016 [name]   [birth year]  [id number]  Primary Physician Lamar Blinks, MD Primary Cardiologist: Dr. Percival Spanish    Reason for Visit/CC: Exertional Dyspnea   HPI:  The patient is a 51 year old female, followed by Dr. 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 Her last EF was 30- 35% by echo in 2015. She did have a stress perfusion study in 2015 that did not demonstrate ischemia.  She is also obese with a history of sleep apnea. She has anxiety.   She presents to clinic today as an add-on for evaluation of dyspnea. She reports a history of chronic dyspnea however she notes worsening symptoms over the last several weeks. She is very short of breath with any type of physical activity. While in clinic today, the patient was observed ambulating from the bathroom back to her exam room and appeared very short of breath with exertion. Oxygen saturations were checked and were stable in the low 90s.  Symptoms improve with rest. She also notes associated atypical chest pain along her right and left rib cage. This is sharp in nature and also pleuritic, exacerbated by deep breathing. She also notes similar pain between her shoulder blades. She denies any lower extremity pain or swelling however she does admit to recent prolonged travel by car. She traveled to [place] in late December and returned in early January. This was a 10+ hour car ride, each way. She has no prior history of blood clots. She is not visibly volume overloaded on exam. EKG shows NSR with possible LAE. No arrhythmias or ST abnormalities. She reports full compliance with Lasix. BP is 137/66. HR is in the 80s.     Current Meds  Medication Sig  . amLODipine (NORVASC) 10 MG tablet Take 1 tablet (10 mg total) by mouth daily.  [name] amphetamine-dextroamphetamine (ADDERALL XR) 30 MG 24 hr capsule Take 1 capsule by mouth daily.  . busPIRone (BUSPAR) 10 MG tablet TAKE 1 TABLET TWICE A DAY  .  carvedilol (COREG) 12.5 MG tablet Take 1 tablet (12.5 mg total) by mouth 2 (two) times daily with a meal.  . carvedilol (COREG) 25 MG tablet Take 1 tablet (25 mg total) by mouth 2 (two) times daily with a meal.  . clonazePAM (KLONOPIN) 1 MG tablet Take 1 tablet (1 mg total) by mouth 2 (two) times daily.  . cyclobenzaprine (FLEXERIL) 10 MG tablet Take 1/2 pill twice a day as needed for muscle spasm  . docusate sodium (COLACE) 100 MG capsule Take 1-2 tabs by mouth twice a day  . enalapril (VASOTEC) 20 MG tablet Take 1 tablet (20 mg total) by mouth 2 (two) times daily.  . furosemide (LASIX) 40 MG tablet Take 1 tablet (40 mg total) by mouth daily. Ok to take a extra tab daily as needed  . ibuprofen (ADVIL,MOTRIN) 800 MG tablet Take 1 tablet (800 mg total) by mouth every 8 (eight) hours as needed for headache or mild pain.  [name] lamoTRIgine (LAMICTAL) 200 MG tablet TAKE 1 TABLET EVERY DAY  . oxycodone-acetaminophen (PERCOCET) 2.5-325 MG tablet Take 1 tablet by mouth every 6 (six) hours as needed for pain.  [name] oxyCODONE-acetaminophen (PERCOCET) 7.5-325 MG tablet Take 1 tablet by mouth every 4 (four) hours as needed for severe pain.  . potassium chloride SA (KLOR-CON M20) 20 MEQ tablet Take 1 tablet (20 mEq total) by mouth daily.  . sertraline (ZOLOFT) 100 MG tablet TAKE 1 TABLET TWICE A DAY  . simvastatin (ZOCOR)  20 MG tablet Take 1 tablet (20 mg total) by mouth daily at 6 PM.   Allergies  Allergen Reactions  . Fluoxetine Other (See Comments)    More depressed  . Cymbalta [Duloxetine Hcl] Other (See Comments)    depressed  . Duloxetine     REACTION: Sucidal thoughts  . Penicillins     REACTION: rash  . Prozac [Fluoxetine Hcl] Other (See Comments)    More depressed   Past Medical History:  Diagnosis Date  . ADHD   . Anxiety   . CARDIOMYOPATHY, PERIPARTUM, POSTPARTUM    a. EF previously 30-40%,  b.  resolved, EF 60%    c.  echo 10/2011:  mild LVH, EF 50%, mild LAE,   . CARPAL TUNNEL SYNDROME,  RIGHT   . CHF (congestive heart failure) ([place])   . Chronic systolic CHF (congestive heart failure), NYHA class 3 ([place]) 12/06/2013  . DEPRESSION   . Edema    a. LE venous dopplers negative for DVT 10/2011  . Essential hypertension, benign   . MIGRAINE HEADACHE   . OBESITY   . Sleep apnea    Family History  Problem Relation Age of Onset  . Depression Mother   . Anxiety disorder Mother   . ADD / ADHD Father   . Hypertension Other   . Diabetes Other   . Colitis Other   . Alcohol abuse Other    Past Surgical History:  Procedure Laterality Date  . child birth  2003  . CHOLECYSTECTOMY     Social History   Social History  . Marital status: Married    Spouse name: N/A  . Number of children: N/A  . Years of education: N/A   Occupational History  . Not on file.   Social History Main Topics  . Smoking status: Current Every Day Smoker    Packs/day: 0.50    Types: Cigarettes    Last attempt to quit: 02/25/1997  . Smokeless tobacco: Never Used     Comment: Married, lives with spouse (when he is not traveling) and son  . Alcohol use No  . Drug use: No  . Sexual activity: Not Currently   Other Topics Concern  . Not on file   Social History Narrative  . No narrative on file     Review of Systems: General: negative for chills, fever, night sweats or weight changes.  Cardiovascular: negative for chest pain, dyspnea on exertion, edema, orthopnea, palpitations, paroxysmal nocturnal dyspnea or shortness of breath Dermatological: negative for rash Respiratory: negative for cough or wheezing Urologic: negative for hematuria Abdominal: negative for nausea, vomiting, diarrhea, bright red blood per rectum, melena, or hematemesis Neurologic: negative for visual changes, syncope, or dizziness All other systems reviewed and are otherwise negative except as noted above.   Physical Exam:  Blood pressure 137/86, pulse 92, height 5\' 3"  (1.6 m), weight (!) 313 lb (142 kg).  General  appearance: alert, cooperative and mildly dyspnic after exertion, obese  Neck: no carotid bruit and no JVD Lungs: clear to auscultation bilaterally Heart: regular rate and rhythm, S1, S2 normal, no murmur, click, rub or gallop Extremities: trace bilateral LEE Pulses: 2+ and symmetric Skin: Skin color, texture, turgor normal. No rashes or lesions Neurologic: Grossly normal  EKG NSR. Possible LAE  ASSESSMENT AND PLAN:   1. Exertional Dyspnea: her O2 sats are stable in clinic in the low 90s. EKG shows NSR w/o ST abnormalties or arrhthymias. Vital signs are stable. Lung and cardiac exams are fairly  unremarkable. Given associated pleuritic chest pain and recent prolonged travel by car to [place], we will need to r/o the possibility of PE. We will start with a d-dimer. If +, she will need a CT scan. We will also check a CBC to r/o anemia and BNP to r/o acute CHF. We will check a BMP as well, in the event that we will need to increase her diuretics. We will also check a CXR to r/o other acute cardiopulmonary disease. She did have a low risk stress test in 2015, however if the above w/u returns negative, then we will need to consider repeat ischemic testing to r/o the presence of CAD. I think she may benefit from a Right +/- LHC and possibly repeat echo. She has f/u with Dr. Percival Spanish next week. He can determine need for additional testing based on results of w/u we conduct today.    [name] PA-C 04/03/2016 11:57 AM

## 2016-04-03 NOTE — Patient Instructions (Addendum)
Medication Instructions:  Your physician recommends that you continue on your current medications as directed. Please refer to the Current Medication list given to you today.  Labwork: Your physician recommends that you return for lab work in: TODAY-D-DIMER, CBC, BMP, BNP  Testing/Procedures: A chest x-ray takes a picture of the organs and structures inside the chest, including the heart, lungs, and blood vessels. This test can show several things, including, whether the heart is enlarges; whether fluid is building up in the lungs; and whether pacemaker / defibrillator leads are still in place.  [place]   [phone]  Follow-Up: Your physician recommends that you schedule a follow-up appointment in: [place]   Any Other Special Instructions Will Be Listed Below (If Applicable).  If you need a refill on your cardiac medications before your next appointment, please call your pharmacy.

## 2016-04-04 ENCOUNTER — Telehealth: Payer: Self-pay | Admitting: Cardiology

## 2016-04-04 ENCOUNTER — Emergency Department (HOSPITAL_COMMUNITY): Payer: Medicare Other

## 2016-04-04 ENCOUNTER — Emergency Department (HOSPITAL_COMMUNITY)
Admission: EM | Admit: 2016-04-04 | Discharge: 2016-04-04 | Disposition: A | Discharge status: Home / Self Care | Payer: Medicare Other | Attending: Emergency Medicine | Admitting: Emergency Medicine

## 2016-04-04 ENCOUNTER — Encounter (HOSPITAL_COMMUNITY): Payer: Self-pay | Admitting: Emergency Medicine

## 2016-04-04 ENCOUNTER — Telehealth: Payer: Self-pay | Admitting: *Deleted

## 2016-04-04 DIAGNOSIS — F17210 Nicotine dependence, cigarettes, uncomplicated: Secondary | ICD-10-CM | POA: Diagnosis not present

## 2016-04-04 DIAGNOSIS — F909 Attention-deficit hyperactivity disorder, unspecified type: Secondary | ICD-10-CM | POA: Diagnosis not present

## 2016-04-04 DIAGNOSIS — Z79899 Other long term (current) drug therapy: Secondary | ICD-10-CM | POA: Diagnosis not present

## 2016-04-04 DIAGNOSIS — R0602 Shortness of breath: Secondary | ICD-10-CM | POA: Diagnosis not present

## 2016-04-04 DIAGNOSIS — I10 Essential (primary) hypertension: Secondary | ICD-10-CM | POA: Diagnosis not present

## 2016-04-04 DIAGNOSIS — I110 Hypertensive heart disease with heart failure: Secondary | ICD-10-CM | POA: Insufficient documentation

## 2016-04-04 DIAGNOSIS — I5023 Acute on chronic systolic (congestive) heart failure: Secondary | ICD-10-CM | POA: Diagnosis not present

## 2016-04-04 DIAGNOSIS — R7989 Other specified abnormal findings of blood chemistry: Secondary | ICD-10-CM

## 2016-04-04 DIAGNOSIS — I509 Heart failure, unspecified: Secondary | ICD-10-CM | POA: Diagnosis not present

## 2016-04-04 LAB — CBC WITH DIFFERENTIAL/PLATELET
Basophils Absolute: 0.1 10*3/uL (ref 0.0–0.1)
Basophils Relative: 1 %
Eosinophils Absolute: 0.2 10*3/uL (ref 0.0–0.7)
Eosinophils Relative: 2 %
HCT: 39.5 % (ref 36.0–46.0)
Hemoglobin: 12.4 g/dL (ref 12.0–15.0)
Lymphocytes Relative: 22 %
Lymphs Abs: 2.4 10*3/uL (ref 0.7–4.0)
MCH: 26.4 pg (ref 26.0–34.0)
MCHC: 31.4 g/dL (ref 30.0–36.0)
MCV: 84 fL (ref 78.0–100.0)
Monocytes Absolute: 0.9 10*3/uL (ref 0.1–1.0)
Monocytes Relative: 8 %
Neutro Abs: 7.5 10*3/uL (ref 1.7–7.7)
Neutrophils Relative %: 67 %
Platelets: 259 10*3/uL (ref 150–400)
RBC: 4.7 MIL/uL (ref 3.87–5.11)
RDW: 15.8 % — ABNORMAL HIGH (ref 11.5–15.5)
WBC: 11 10*3/uL — ABNORMAL HIGH (ref 4.0–10.5)

## 2016-04-04 LAB — D-DIMER, QUANTITATIVE: D-Dimer, Quant: 0.85 mcg/mL FEU — ABNORMAL HIGH (ref <0.50)

## 2016-04-04 LAB — BRAIN NATRIURETIC PEPTIDE
B Natriuretic Peptide: 139.5 pg/mL — ABNORMAL HIGH (ref 0.0–100.0)
Brain Natriuretic Peptide: 157.2 pg/mL — ABNORMAL HIGH (ref <100)

## 2016-04-04 LAB — I-STAT CHEM 8, ED
BUN: 25 mg/dL — ABNORMAL HIGH (ref 6–20)
Calcium, Ion: 1.12 mmol/L — ABNORMAL LOW (ref 1.15–1.40)
Chloride: 105 mmol/L (ref 101–111)
Creatinine, Ser: 0.8 mg/dL (ref 0.44–1.00)
Glucose, Bld: 106 mg/dL — ABNORMAL HIGH (ref 65–99)
HCT: 40 % (ref 36.0–46.0)
Hemoglobin: 13.6 g/dL (ref 12.0–15.0)
Potassium: 4 mmol/L (ref 3.5–5.1)
Sodium: 141 mmol/L (ref 135–145)
TCO2: 27 mmol/L (ref 0–100)

## 2016-04-04 LAB — BASIC METABOLIC PANEL
Anion gap: 8 (ref 5–15)
BUN: 22 mg/dL — ABNORMAL HIGH (ref 6–20)
CO2: 26 mmol/L (ref 22–32)
Calcium: 9 mg/dL (ref 8.9–10.3)
Chloride: 104 mmol/L (ref 101–111)
Creatinine, Ser: 0.83 mg/dL (ref 0.44–1.00)
GFR calc Af Amer: >60 mL/min (ref >60)
GFR calc non Af Amer: >60 mL/min (ref >60)
Glucose, Bld: 112 mg/dL — ABNORMAL HIGH (ref 65–99)
Potassium: 4.1 mmol/L (ref 3.5–5.1)
Sodium: 138 mmol/L (ref 135–145)

## 2016-04-04 LAB — I-STAT TROPONIN, ED: Troponin i, poc: 0 ng/mL (ref 0.00–0.08)

## 2016-04-04 MED ORDER — IOPAMIDOL (ISOVUE-370) INJECTION 76%
INTRAVENOUS | Status: AC
  Administered 2016-04-04: 100 mL
  Filled 2016-04-04: qty 100

## 2016-04-04 NOTE — Telephone Encounter (Signed)
Returned call to patient.She stated she feels bad.Sob cannot walk from room to room without being sob.She has gained 13 lbs since 11/17.Stated she wanted to know results of recent lab and cxr.Spoke to Dr.Hochrein he advised to go to [facility] ER.

## 2016-04-04 NOTE — ED Notes (Signed)
Received call from PCP.  States patient being sent to ED for elevated d-dimer.

## 2016-04-04 NOTE — ED Provider Notes (Signed)
[place] DEPT Provider Note   CSN: [id number] Arrival date & time: 04/04/16  1630     History   Chief Complaint Chief Complaint  Patient presents with  . Shortness of Breath    HPI Secora Villegas is a 51 y.o. female.  51 yo F with exertional dyspnea for past month.  Seen at cards clinic yesterday with +ddimer, called to come in here for further eval.  Doesn't feel she is more fluid overloaded than normal.  Chest pain with this as well.  Denies cough, congestion, fevers.    The history is provided by the patient.  Shortness of Breath  This is a new problem. The average episode lasts 1 month. The problem occurs continuously.The current episode started more than 1 week ago. The problem has not changed since onset.Pertinent negatives include no fever, no headaches, no rhinorrhea, no wheezing, no chest pain and no vomiting. She has tried nothing for the symptoms. The treatment provided no relief. Associated medical issues include heart failure. Associated medical issues do not include COPD.    Past Medical History:  Diagnosis Date  . ADHD   . Anxiety   . CARDIOMYOPATHY, PERIPARTUM, POSTPARTUM    a. EF previously 30-40%,  b.  resolved, EF 60%    c.  echo 10/2011:  mild LVH, EF 50%, mild LAE,   . CARPAL TUNNEL SYNDROME, RIGHT   . CHF (congestive heart failure) ([place])   . Chronic systolic CHF (congestive heart failure), NYHA class 3 ([place]) 12/06/2013  . DEPRESSION   . Edema    a. LE venous dopplers negative for DVT 10/2011  . Essential hypertension, benign   . MIGRAINE HEADACHE   . OBESITY   . Sleep apnea     Patient Active Problem List   Diagnosis Date Noted  . Major depression 01/04/2015  . Major depression in partial remission ([place]) 11/18/2014  . Obstructive sleep apnea 10/28/2014  . Depression 10/28/2014  . Chronic systolic CHF (congestive heart failure), NYHA class 3 ([place]) 12/06/2013  . MDD (major depressive disorder), recurrent episode, severe ([place]) 12/05/2013  .  Noncompliance with medications 11/28/2013  . Acute on chronic systolic CHF (congestive heart failure), NYHA class 3 ([place]) 11/27/2013  . Dyslipidemia 04/04/2011  . Cervical radicular pain 03/25/2011  . Neck pain, acute 03/08/2011  . CARPAL TUNNEL SYNDROME, RIGHT 04/05/2009  . ABNORMAL ELECTROCARDIOGRAM 01/16/2009  . Uncontrolled hypertension 06/03/2008  . Morbid obesity ([place]) 07/12/2006  . DEPRESSION 07/12/2006  . Attention deficit hyperactivity disorder (ADHD) 07/12/2006  . MIGRAINE HEADACHE 07/12/2006  . CARDIOMYOPATHY, PERIPARTUM, POSTPARTUM 07/12/2006    Past Surgical History:  Procedure Laterality Date  . child birth  2003  . CHOLECYSTECTOMY      OB History    No data available       Home Medications    Prior to Admission medications   Medication Sig Start Date End Date Taking? Authorizing Provider  amLODipine (NORVASC) 10 MG tablet Take 1 tablet (10 mg total) by mouth daily. 10/16/15   Minus Breeding, MD  amphetamine-dextroamphetamine (ADDERALL XR) 30 MG 24 hr capsule Take 1 capsule by mouth daily. 10/26/15   Historical Provider, MD  busPIRone (BUSPAR) 10 MG tablet TAKE 1 TABLET TWICE A DAY 09/07/15   Jenne Campus, MD  carvedilol (COREG) 12.5 MG tablet Take 1 tablet (12.5 mg total) by mouth 2 (two) times daily with a meal. 10/16/15   Minus Breeding, MD  carvedilol (COREG) 25 MG tablet Take 1 tablet (25 mg total) by mouth  2 (two) times daily with a meal. 10/16/15   Minus Breeding, MD  clonazePAM (KLONOPIN) 1 MG tablet Take 1 tablet (1 mg total) by mouth 2 (two) times daily. 10/18/15   Jenne Campus, MD  cyclobenzaprine (FLEXERIL) 10 MG tablet Take 1/2 pill twice a day as needed for muscle spasm 08/31/15   Gay Filler Copland, MD  docusate sodium (COLACE) 100 MG capsule Take 1-2 tabs by mouth twice a day 12/29/15   Evelene Croon Barrett, PA-C  enalapril (VASOTEC) 20 MG tablet Take 1 tablet (20 mg total) by mouth 2 (two) times daily. 10/16/15   Minus Breeding, MD  furosemide (LASIX)  40 MG tablet Take 1 tablet (40 mg total) by mouth daily. Ok to take a extra tab daily as needed 12/29/15   Evelene Croon Barrett, PA-C  ibuprofen (ADVIL,MOTRIN) 800 MG tablet Take 1 tablet (800 mg total) by mouth every 8 (eight) hours as needed for headache or mild pain. 10/25/15   Darreld Mclean, MD  lamoTRIgine (LAMICTAL) 200 MG tablet TAKE 1 TABLET EVERY DAY 09/07/15   Jenne Campus, MD  oxycodone-acetaminophen (PERCOCET) 2.5-325 MG tablet Take 1 tablet by mouth every 6 (six) hours as needed for pain. 08/31/15   Gay Filler Copland, MD  oxyCODONE-acetaminophen (PERCOCET) 7.5-325 MG tablet Take 1 tablet by mouth every 4 (four) hours as needed for severe pain.    Historical Provider, MD  potassium chloride SA (KLOR-CON M20) 20 MEQ tablet Take 1 tablet (20 mEq total) by mouth daily. 10/16/15   Minus Breeding, MD  sertraline (ZOLOFT) 100 MG tablet TAKE 1 TABLET TWICE A DAY 09/07/15   Jenne Campus, MD  simvastatin (ZOCOR) 20 MG tablet Take 1 tablet (20 mg total) by mouth daily at 6 PM. 10/16/15   Minus Breeding, MD    Family History Family History  Problem Relation Age of Onset  . Depression Mother   . Anxiety disorder Mother   . ADD / ADHD Father   . Hypertension Other   . Diabetes Other   . Colitis Other   . Alcohol abuse Other     Social History Social History  Substance Use Topics  . Smoking status: Current Every Day Smoker    Packs/day: 0.50    Types: Cigarettes    Last attempt to quit: 02/25/1997  . Smokeless tobacco: Never Used     Comment: Married, lives with spouse (when he is not traveling) and son  . Alcohol use No     Allergies   Fluoxetine; Cymbalta [duloxetine hcl]; Duloxetine; Penicillins; and Prozac [fluoxetine hcl]   Review of Systems Review of Systems  Constitutional: Negative for chills and fever.  HENT: Negative for congestion and rhinorrhea.   Eyes: Negative for redness and visual disturbance.  Respiratory: Positive for shortness of breath. Negative for  wheezing.   Cardiovascular: Negative for chest pain and palpitations.  Gastrointestinal: Negative for nausea and vomiting.  Genitourinary: Negative for dysuria and urgency.  Musculoskeletal: Negative for arthralgias and myalgias.  Skin: Negative for pallor and wound.  Neurological: Negative for dizziness and headaches.     Physical Exam Updated Vital Signs BP 126/87   Pulse 83   Temp 99.1 F (37.3 C) (Oral)   Resp 20   SpO2 94%   Physical Exam  Constitutional: She is oriented to person, place, and time. She appears well-developed and well-nourished. No distress.  HENT:  Head: Normocephalic and atraumatic.  Eyes: EOM are normal. Pupils are equal, round, and reactive to light.  Neck: Normal range of motion. Neck supple.  Cardiovascular: Normal rate and regular rhythm.  Exam reveals no gallop and no friction rub.   No murmur heard. Pulmonary/Chest: Effort normal. She has no wheezes. She has no rales.  Abdominal: Soft. She exhibits no distension. There is no tenderness.  Musculoskeletal: She exhibits no edema or tenderness.  Neurological: She is alert and oriented to person, place, and time.  Skin: Skin is warm and dry. She is not diaphoretic.  Psychiatric: She has a normal mood and affect. Her behavior is normal.  Nursing note and vitals reviewed.    ED Treatments / Results  Labs (all labs ordered are listed, but only abnormal results are displayed) Labs Reviewed  CBC WITH DIFFERENTIAL/PLATELET - Abnormal; Notable for the following:       Result Value   WBC 11.0 (*)    RDW 15.8 (*)    All other components within normal limits  BASIC METABOLIC PANEL - Abnormal; Notable for the following:    Glucose, Bld 112 (*)    BUN 22 (*)    All other components within normal limits  BRAIN NATRIURETIC PEPTIDE - Abnormal; Notable for the following:    B Natriuretic Peptide 139.5 (*)    All other components within normal limits  I-STAT CHEM 8, ED - Abnormal; Notable for the  following:    BUN 25 (*)    Glucose, Bld 106 (*)    Calcium, Ion 1.12 (*)    All other components within normal limits  I-STAT TROPOININ, ED    EKG  EKG Interpretation  Date/Time:  Thursday April 04 2016 16:36:55 EST Ventricular Rate:  91 PR Interval:  166 QRS Duration: 94 QT Interval:  388 QTC Calculation: 477 R Axis:   -37 Text Interpretation:  Normal sinus rhythm Left axis deviation Anterior infarct , age undetermined Abnormal ECG No significant change since last tracing Confirmed by Kyrianna Barletta MD, Quillian Quince [phone]) on 04/04/2016 6:26:09 PM       Radiology Dg Chest 2 View  Result Date: 04/03/2016 CLINICAL DATA:  Recurrent shortness of breath over the last month. EXAM: CHEST  2 VIEW COMPARISON:  07/04/2015 FINDINGS: Chronic cardiomegaly. Mediastinal shadows are normal. There is mild patchy density both lung bases that could be atelectasis or mild pneumonia. There is fluid in the fissures and probable venous hypertension. No frank edema. IMPRESSION: Mild density in the lower lungs a could be mild atelectasis or pneumonia. Small amount of fluid in the fissures. These films could be consistent with mild congestive heart failure and/or mild basilar pneumonia. Electronically Signed   By: Nelson Chimes M.D.   On: 04/03/2016 14:44   Ct Angio Chest Pe W And/or Wo Contrast  Result Date: 04/04/2016 CLINICAL DATA:  Dyspnea, intermittent chest pain x1 month with elevated D-dimer. Current smoker, hypertension and CHF. EXAM: CT ANGIOGRAPHY CHEST WITH CONTRAST TECHNIQUE: Multidetector CT imaging of the chest was performed using the standard protocol during bolus administration of intravenous contrast. Multiplanar CT image reconstructions and MIPs were obtained to evaluate the vascular anatomy. CONTRAST:  100 cc Isovue 370 IV COMPARISON:  None. FINDINGS: Cardiovascular: No large central pulmonary embolus. Respiratory motion artifacts limit assessment of more distal pulmonary arterial branches beyond the second  order branches. No aortic aneurysm or dissection. Cardiomegaly with coronary arteriosclerosis. Mediastinum/Nodes: No mediastinal nor axillary lymphadenopathy. Mildly enlarged reactive hilar lymph nodes, the greatest 13 mm on the left. Unremarkable esophagus. Trachea and mainstem bronchi are patent. Lungs/Pleura: Respiratory motion artifacts limit assessment. Minimal thorax,  effusion or pulmonary consolidations. Mild diffuse ground-glass opacity in the lungs likely related to hypoventilatory change. Upper Abdomen: Cholecystectomy. The visualized adrenal glands, pancreas and spleen are unremarkable. Musculoskeletal: Degenerative change along the dorsal spine. No acute osseous abnormality. Review of the MIP images confirms the above findings. IMPRESSION: No large central pulmonary embolus. No pneumonic consolidation or effusion. No pneumothorax. Electronically Signed   By: Ashley Royalty M.D.   On: 04/04/2016 19:41    Procedures Procedures (including critical care time)  Medications Ordered in ED Medications  iopamidol (ISOVUE-370) 76 % injection (100 mLs  Contrast Given 04/04/16 1917)     Initial Impression / Assessment and Plan / ED Course  I have reviewed the triage vital signs and the nursing notes.  Pertinent labs & imaging results that were available during my care of the patient were reviewed by me and considered in my medical decision making (see chart for details).     51 yo F with exertional sob.  Going on for past month.  + ddimer in cards office yesterday, will CT for possible PE.  CT scan with no PE, no pulmonary edema, no pneumonia.  Offered admission with patient. Discussed the risks and benefits of admission. Patient's electing for outpatient follow-up with her cardiologist. I discussed that I was unable to determine whether or not this was a heart attack or not and that she may need serial troponins overnight and stress test. She will call her cardiologist in the morning.  11:25 PM:   I have discussed the diagnosis/risks/treatment options with the patient and family and believe the pt to be eligible for discharge home to follow-up with PCP. We also discussed returning to the ED immediately if new or worsening sx occur. We discussed the sx which are most concerning (e.g., sudden worsening pain, fever, inability to tolerate by mouth) that necessitate immediate return. Medications administered to the patient during their visit and any new prescriptions provided to the patient are listed below.  Medications given during this visit Medications  iopamidol (ISOVUE-370) 76 % injection (100 mLs  Contrast Given 04/04/16 1917)     The patient appears reasonably screen and/or stabilized for discharge and I doubt any other medical condition or other [facility] requiring further screening, evaluation, or treatment in the ED at this time prior to discharge.     Final Clinical Impressions(s) / ED Diagnoses   Final diagnoses:  SOB (shortness of breath) on exertion    New Prescriptions Discharge Medication List as of 04/04/2016  9:29 PM       Deno Etienne, DO 04/04/16 2325

## 2016-04-04 NOTE — ED Notes (Signed)
Pt ambulated in hallway SPO2 dropped to 92% at it's lowest though pt. Appeared very SOB during ambulation trial

## 2016-04-04 NOTE — Addendum Note (Signed)
Addended by: Gaetano Net on: 04/04/2016 03:59 PM   Modules accepted: Orders

## 2016-04-04 NOTE — Telephone Encounter (Signed)
BRITTIANY SIMMONS PA  CALLED RE: PT  HAS  ELEVATED  D DIMER   NEEDS TO  GO TO ER  FOR  CHEST CT TO RULE OUT  PE .PT  AWARE  AND  IS ON WAY TO  [place] .Adonis Housekeeper

## 2016-04-04 NOTE — Telephone Encounter (Signed)
I instructed triage to call pt and inform to go to ED for STAT CT to r/o PE in the setting of dyspnea and an elevated d-dimer

## 2016-04-04 NOTE — Telephone Encounter (Signed)
Returned pts call and she is heading to the ER.

## 2016-04-04 NOTE — ED Triage Notes (Signed)
Pt here with increased SOB and pain in chest worse with cough; pt sent here for possible CHF or PNA

## 2016-04-04 NOTE — ED Notes (Signed)
PT wants something for pain

## 2016-04-04 NOTE — Telephone Encounter (Signed)
Pt wants to know if her xrays results are back from yesterday please.Also if her lab results are ready. Please call today if possible.

## 2016-04-05 ENCOUNTER — Telehealth: Payer: Self-pay | Admitting: Cardiology

## 2016-04-05 NOTE — Telephone Encounter (Signed)
Patient made aware of results as well as recommendations from [place] PA to increase Lasix 40mg  BID over the weekend.  Pt verbalized understanding and aware of appointment on Monday.

## 2016-04-05 NOTE — Telephone Encounter (Signed)
Close encounter 

## 2016-04-05 NOTE — Telephone Encounter (Signed)
Results of CT Scan did not show any blood clot in lungs. Her BNP was slightly elevated. I recommend that she increase her Lasix to 40 mg BID over the weekend and to keep her f/u with Dr. Percival Spanish on Monday to reassess symptoms and for repeat BMP. Her other labs were ok. No anemia. Her white count was 9.7 so I don't think this is PNA. CXR looks similar to the once she had ilast May. We may need to consider a Right and left heart cath to further assess the cause of her dyspnea. I will leave this decision to Dr. Percival Spanish.

## 2016-04-05 NOTE — Telephone Encounter (Signed)
Returned call and spoke to patient- patient very concerned about ongoing symptoms and confused about her blood work and testing that she has had completed.    After chart review-advised patient of why she was sent to the ER and why the CT scan was done.  Also reviewed her chest xray and lab work with her as well.  Reassured patient that so far the testing is reassuring at this point and she has an appointment on Monday 2/12 with Dr. Percival Spanish to discuss further testing due to her ongoing symptoms.    Patient verbalized understanding and appreciated talking with her.  Patient states if Dr. Percival Spanish does not think this is cardiac related she would like to be referred to a pulmonologist.  Also requesting to be referred to a PCP-as hers moved out of town and she does not want to go back to her current PCP.  Request I let Dr. Percival Spanish know this so he is aware for appointment on Monday.    Advised I would make Dr. Percival Spanish aware and she can discuss with him on Monday.  Forwarded to Dr. Percival Spanish and primary to make aware.

## 2016-04-05 NOTE — Telephone Encounter (Signed)
[name] is calling on behalf of wife, [name]. He states that his wife was told to go to the ER yesterday during her visit with [place] for a CT scan and to have blood work completed. The ER could not find the diagnosis, however the results for the CT scan and the blood work were "horrible." [name] would like a call back on what his wife should do next and if she needs to come in for a stress test. Please call, thanks.

## 2016-04-07 NOTE — Progress Notes (Signed)
HPI The patient presents for evaluation of edema and palpitations and cardiomyopathy.   Her last EF was 35%.  This has been stable since 2015.  I repeated this in August of last year.   She did have a stress perfusion study that did not demonstrate ischemia in 2015.  She had a sleep study.  She has been diagnosed with sleep apnea.  Since I last saw her she had had an office visit in which she had some pleuritic type chest pain and she has had increasing SOB.  She did have oxygen saturations in the office that were in the low 90s with ambulation.  However, she was very subjectively SOB.   She had a mildly elevated D Dimer and BNP.  She has had multiple phone calls to the office complaining of SOB.  We sent her to the ED.  I reviewed these records from last week.  She had a CT which demonstrated no PE and no edema or evidence of pneumonia.   POC troponin was negative.  She returns for follow up.  She continues to describe dyspnea with activities such as walking a short distance on level ground. She's not describing chest pressure or neck discomfort. She's not describing palpitations, presyncope or syncope. She does have some mild chronic lower leg swelling. She's not describing PND or orthopnea.  She does have some left arm discomfort when she is coughing that she feels down in her wrist.   Allergies  Allergen Reactions  . Fluoxetine Other (See Comments)    More depressed  . Cymbalta [Duloxetine Hcl] Other (See Comments)    depressed  . Duloxetine     REACTION: Sucidal thoughts  . Penicillins     REACTION: rash  . Prozac [Fluoxetine Hcl] Other (See Comments)    More depressed    Current Outpatient Prescriptions  Medication Sig Dispense Refill  . amLODipine (NORVASC) 10 MG tablet Take 1 tablet (10 mg total) by mouth daily. 90 tablet 3  . busPIRone (BUSPAR) 10 MG tablet TAKE 1 TABLET TWICE A DAY 180 tablet 0  . carvedilol (COREG) 12.5 MG tablet Take 1 tablet (12.5 mg total) by mouth 2 (two)  times daily with a meal. 180 tablet 3  . carvedilol (COREG) 25 MG tablet Take 1 tablet (25 mg total) by mouth 2 (two) times daily with a meal. 180 tablet 3  . clonazePAM (KLONOPIN) 1 MG tablet Take 1 tablet (1 mg total) by mouth 2 (two) times daily. 90 tablet 1  . cyclobenzaprine (FLEXERIL) 10 MG tablet Take 1/2 pill twice a day as needed for muscle spasm (Patient taking differently: Take 5 mg by mouth 2 (two) times daily as needed for muscle spasms. Take 1/2 pill twice a day as needed for muscle spasm) 30 tablet 0  . docusate sodium (COLACE) 100 MG capsule Take 1-2 tabs by mouth twice a day 120 capsule 4  . enalapril (VASOTEC) 20 MG tablet Take 1 tablet (20 mg total) by mouth 2 (two) times daily. 180 tablet 3  . furosemide (LASIX) 40 MG tablet Take 1 tablet (40 mg total) by mouth daily. Ok to take a extra tab daily as needed 180 tablet 3  . ibuprofen (ADVIL,MOTRIN) 800 MG tablet Take 1 tablet (800 mg total) by mouth every 8 (eight) hours as needed for headache or mild pain. 30 tablet 1  . lamoTRIgine (LAMICTAL) 200 MG tablet TAKE 1 TABLET EVERY DAY 90 tablet 0  . oxyCODONE-acetaminophen (PERCOCET) 7.5-325 MG tablet Take  1 tablet by mouth every 4 (four) hours as needed for severe pain.    . potassium chloride SA (KLOR-CON M20) 20 MEQ tablet Take 1 tablet (20 mEq total) by mouth daily. 90 tablet 3  . sertraline (ZOLOFT) 100 MG tablet TAKE 1 TABLET TWICE A DAY 180 tablet 0  . simvastatin (ZOCOR) 20 MG tablet Take 1 tablet (20 mg total) by mouth daily at 6 PM. 90 tablet 3   No current facility-administered medications for this visit.     Past Medical History:  Diagnosis Date  . ADHD   . Anxiety   . CARDIOMYOPATHY, PERIPARTUM, POSTPARTUM    a. EF previously 30-40%,  b.  resolved, EF 60%    c.  echo 10/2011:  mild LVH, EF 50%, mild LAE,   . CARPAL TUNNEL SYNDROME, RIGHT   . CHF (congestive heart failure) ([place])   . Chronic systolic CHF (congestive heart failure), NYHA class 3 ([place]) 12/06/2013  .  DEPRESSION   . Edema    a. LE venous dopplers negative for DVT 10/2011  . Essential hypertension, benign   . MIGRAINE HEADACHE   . OBESITY   . Sleep apnea     Past Surgical History:  Procedure Laterality Date  . child birth  2003  . CHOLECYSTECTOMY     ROS:  As stated in the HPI and negative for all other systems.  PHYSICAL EXAM BP 106/72   Pulse 84   Resp (!) 96   Ht 5\' 3"  (1.6 m)   Wt (!) 310 lb (140.6 kg)   BMI 54.91 kg/m  GENERAL:  Well appearing NECK:  No jugular venous distention, waveform within normal limits, carotid upstroke brisk and symmetric, no bruits, no thyromegaly LUNGS:  Clear to auscultation bilaterally BACK:  No CVA tenderness CHEST:  Unremarkable HEART:  PMI not displaced or sustained,S1 and S2 within normal limits, no S3, no S4, no clicks, no rubs, no murmurs ABD:  Flat, positive bowel sounds normal in frequency in pitch, no bruits, no rebound, no guarding, no midline pulsatile mass, no hepatomegaly, no splenomegaly, obese EXT:  2 plus pulses throughout, mild to trace left greater than right lower extremity edema, no cyanosis no clubbing NEURO: Nonfocal.     ASSESSMENT AND PLAN  DYSPNEA -  This is the predominant complaints.  However, there has been no evidence of decompensated CHF.  She did not have a clinical or Xray evidence for pneumonia or URI.  She has no evidence for PE.     Her weight is up 16 lbs since I saw her last year.  However, given the fact that she has the cardiomyopathy and worsening symptoms right and left heart catheterization is indicated. The patient understands that risks included but are not limited to stroke (1 in 1000), death (1 in 56), kidney failure [usually temporary] (1 in 500), bleeding (1 in 200), allergic reaction [possibly serious] (1 in 200).  The patient understands and agrees to proceed.  I will check another basic metabolic profile again today.  EDEMA - This will be evaluated as above. She's on a higher dose of Lasix  and she will continue with this.  CARDIOMYOPATHY, PERIPARTUM, POSTPARTUM -  As above  ESSENTIAL HYPERTENSION, BENIGN -  This is being managed in the context of treating his CHF.  No change in therapy is indicated.   ANXIETY DEPRESSION - This is ongoing.  She continues to have active therapy for this.   APNEA - She has been diagnosed with sleep  apnea. She has been prescribed CPAP.    BACK PAIN - I told her I will defer management to her primary provider.   ED records reviewed for this visit.

## 2016-04-08 ENCOUNTER — Encounter: Payer: Self-pay | Admitting: Cardiology

## 2016-04-08 ENCOUNTER — Ambulatory Visit (INDEPENDENT_AMBULATORY_CARE_PROVIDER_SITE_OTHER): Payer: Medicare Other | Admitting: Cardiology

## 2016-04-08 VITALS — BP 106/72 | HR 84 | Resp 96 | Ht 63.0 in | Wt 310.0 lb

## 2016-04-08 DIAGNOSIS — R5383 Other fatigue: Secondary | ICD-10-CM | POA: Diagnosis not present

## 2016-04-08 DIAGNOSIS — D689 Coagulation defect, unspecified: Secondary | ICD-10-CM | POA: Diagnosis not present

## 2016-04-08 DIAGNOSIS — R0602 Shortness of breath: Secondary | ICD-10-CM

## 2016-04-08 DIAGNOSIS — Z01812 Encounter for preprocedural laboratory examination: Secondary | ICD-10-CM

## 2016-04-08 DIAGNOSIS — I5023 Acute on chronic systolic (congestive) heart failure: Secondary | ICD-10-CM | POA: Diagnosis not present

## 2016-04-08 LAB — BASIC METABOLIC PANEL
BUN: 20 mg/dL (ref 7–25)
CO2: 31 mmol/L (ref 20–31)
Calcium: 9.1 mg/dL (ref 8.6–10.4)
Chloride: 84 mmol/L — ABNORMAL LOW (ref 98–110)
Creat: 0.91 mg/dL (ref 0.50–1.05)
Glucose, Bld: 99 mg/dL (ref 65–99)
Potassium: 4 mmol/L (ref 3.5–5.3)
Sodium: 141 mmol/L (ref 135–146)

## 2016-04-08 LAB — TSH: TSH: 5.45 mIU/L — ABNORMAL HIGH

## 2016-04-08 NOTE — Patient Instructions (Signed)
Medication Instructions:  Continue current medications  Labwork: Pre-Op Labs  Testing/Procedures: Your physician has requested that you have a Left and Right cardiac catheterization. Cardiac catheterization is used to diagnose and/or treat various heart conditions. Doctors may recommend this procedure for a number of different reasons. The most common reason is to evaluate chest pain. Chest pain can be a symptom of coronary artery disease (CAD), and cardiac catheterization can show whether plaque is narrowing or blocking your heart's arteries. This procedure is also used to evaluate the valves, as well as measure the blood flow and oxygen levels in different parts of your heart. For further information please visit [email or web address]. Please follow instruction sheet, as given.  Follow-Up: Your physician recommends that you schedule a follow-up appointment in: As Cath   Any Other Special Instructions Will Be Listed Below (If Applicable).   If you need a refill on your cardiac medications before your next appointment, please call your pharmacy.

## 2016-04-09 LAB — PROTIME-INR
INR: 1
Prothrombin Time: 10.8 s (ref 9.0–11.5)

## 2016-04-09 LAB — CBC
HCT: 43.5 % (ref 35.0–45.0)
Hemoglobin: 13.6 g/dL (ref 11.7–15.5)
MCH: 26.5 pg — ABNORMAL LOW (ref 27.0–33.0)
MCHC: 31.3 g/dL — ABNORMAL LOW (ref 32.0–36.0)
MCV: 84.8 fL (ref 80.0–100.0)
MPV: 9.3 fL (ref 7.5–12.5)
Platelets: 329 10*3/uL (ref 140–400)
RBC: 5.13 MIL/uL — ABNORMAL HIGH (ref 3.80–5.10)
RDW: 16.1 % — ABNORMAL HIGH (ref 11.0–15.0)
WBC: 9.5 10*3/uL (ref 3.8–10.8)

## 2016-04-09 LAB — APTT: aPTT: 31 s (ref 22–34)

## 2016-04-11 ENCOUNTER — Ambulatory Visit (HOSPITAL_COMMUNITY)
Admission: RE | Admit: 2016-04-11 | Discharge: 2016-04-11 | Disposition: A | Discharge status: Home / Self Care | Payer: Medicare Other | Source: Ambulatory Visit | Attending: Cardiovascular Disease | Admitting: Cardiovascular Disease

## 2016-04-11 ENCOUNTER — Other Ambulatory Visit: Payer: Self-pay | Admitting: Cardiology

## 2016-04-11 ENCOUNTER — Encounter (HOSPITAL_COMMUNITY)
Admission: RE | Disposition: A | Discharge status: Home / Self Care | Payer: Self-pay | Source: Ambulatory Visit | Attending: Cardiovascular Disease

## 2016-04-11 DIAGNOSIS — I428 Other cardiomyopathies: Secondary | ICD-10-CM | POA: Insufficient documentation

## 2016-04-11 DIAGNOSIS — Z6841 Body Mass Index (BMI) 40.0 and over, adult: Secondary | ICD-10-CM | POA: Diagnosis not present

## 2016-04-11 DIAGNOSIS — R609 Edema, unspecified: Secondary | ICD-10-CM | POA: Diagnosis not present

## 2016-04-11 DIAGNOSIS — I110 Hypertensive heart disease with heart failure: Secondary | ICD-10-CM | POA: Diagnosis not present

## 2016-04-11 DIAGNOSIS — F909 Attention-deficit hyperactivity disorder, unspecified type: Secondary | ICD-10-CM | POA: Insufficient documentation

## 2016-04-11 DIAGNOSIS — F329 Major depressive disorder, single episode, unspecified: Secondary | ICD-10-CM | POA: Diagnosis not present

## 2016-04-11 DIAGNOSIS — G4730 Sleep apnea, unspecified: Secondary | ICD-10-CM | POA: Insufficient documentation

## 2016-04-11 DIAGNOSIS — I5023 Acute on chronic systolic (congestive) heart failure: Secondary | ICD-10-CM

## 2016-04-11 DIAGNOSIS — I5043 Acute on chronic combined systolic (congestive) and diastolic (congestive) heart failure: Secondary | ICD-10-CM | POA: Diagnosis not present

## 2016-04-11 DIAGNOSIS — G43909 Migraine, unspecified, not intractable, without status migrainosus: Secondary | ICD-10-CM | POA: Insufficient documentation

## 2016-04-11 DIAGNOSIS — F419 Anxiety disorder, unspecified: Secondary | ICD-10-CM | POA: Insufficient documentation

## 2016-04-11 DIAGNOSIS — M549 Dorsalgia, unspecified: Secondary | ICD-10-CM | POA: Insufficient documentation

## 2016-04-11 DIAGNOSIS — Z880 Allergy status to penicillin: Secondary | ICD-10-CM | POA: Diagnosis not present

## 2016-04-11 HISTORY — PX: RIGHT/LEFT HEART CATH AND CORONARY ANGIOGRAPHY: CATH118266

## 2016-04-11 LAB — POCT I-STAT 3, ART BLOOD GAS (G3+)
Acid-Base Excess: 4 mmol/L — ABNORMAL HIGH (ref 0.0–2.0)
Bicarbonate: 29.8 mmol/L — ABNORMAL HIGH (ref 20.0–28.0)
O2 Saturation: 95 %
TCO2: 31 mmol/L (ref 0–100)
pCO2 arterial: 47.7 mmHg (ref 32.0–48.0)
pH, Arterial: 7.403 (ref 7.350–7.450)
pO2, Arterial: 77 mmHg — ABNORMAL LOW (ref 83.0–108.0)

## 2016-04-11 LAB — POCT I-STAT 3, VENOUS BLOOD GAS (G3P V)
Acid-Base Excess: 3 mmol/L — ABNORMAL HIGH (ref 0.0–2.0)
Bicarbonate: 29.5 mmol/L — ABNORMAL HIGH (ref 20.0–28.0)
O2 Saturation: 72 %
TCO2: 31 mmol/L (ref 0–100)
pCO2, Ven: 50.7 mmHg (ref 44.0–60.0)
pH, Ven: 7.373 (ref 7.250–7.430)
pO2, Ven: 40 mmHg (ref 32.0–45.0)

## 2016-04-11 LAB — HCG, SERUM, QUALITATIVE: Preg, Serum: NEGATIVE

## 2016-04-11 SURGERY — RIGHT/LEFT HEART CATH AND CORONARY ANGIOGRAPHY
Anesthesia: LOCAL

## 2016-04-11 MED ORDER — ASPIRIN 81 MG PO CHEW
CHEWABLE_TABLET | ORAL | Status: AC
  Administered 2016-04-11: 81 mg via ORAL
  Filled 2016-04-11: qty 1

## 2016-04-11 MED ORDER — LIDOCAINE HCL (PF) 1 % IJ SOLN
INTRAMUSCULAR | Status: AC
Start: 2016-04-11 — End: 2016-04-11
  Filled 2016-04-11: qty 30

## 2016-04-11 MED ORDER — SODIUM CHLORIDE 0.9 % IV SOLN: INTRAVENOUS | Status: AC

## 2016-04-11 MED ORDER — HEPARIN SODIUM (PORCINE) 1000 UNIT/ML IJ SOLN
INTRAMUSCULAR | Status: AC
  Filled 2016-04-11: qty 1

## 2016-04-11 MED ORDER — LIDOCAINE HCL (PF) 1 % IJ SOLN
INTRAMUSCULAR | Status: DC | PRN
  Administered 2016-04-11: 2 mL
  Administered 2016-04-11: 3 mL

## 2016-04-11 MED ORDER — SODIUM CHLORIDE 0.9% FLUSH: 3.0000 mL | INTRAVENOUS | Status: DC | PRN

## 2016-04-11 MED ORDER — SODIUM CHLORIDE 0.9 % IV SOLN: 250.0000 mL | INTRAVENOUS | Status: DC | PRN

## 2016-04-11 MED ORDER — HEPARIN (PORCINE) IN NACL 2-0.9 UNIT/ML-% IJ SOLN
INTRAMUSCULAR | Status: DC | PRN
  Administered 2016-04-11: 1000 mL

## 2016-04-11 MED ORDER — ASPIRIN 81 MG PO CHEW
81.0000 mg | CHEWABLE_TABLET | ORAL | Status: AC
  Administered 2016-04-11: 81 mg via ORAL

## 2016-04-11 MED ORDER — VERAPAMIL HCL 2.5 MG/ML IV SOLN
INTRAVENOUS | Status: DC | PRN
  Administered 2016-04-11: 10 mL via INTRA_ARTERIAL

## 2016-04-11 MED ORDER — IOPAMIDOL (ISOVUE-370) INJECTION 76%
INTRAVENOUS | Status: DC | PRN
  Administered 2016-04-11: 70 mL via INTRA_ARTERIAL

## 2016-04-11 MED ORDER — MIDAZOLAM HCL 2 MG/2ML IJ SOLN
INTRAMUSCULAR | Status: DC | PRN
  Administered 2016-04-11 (×2): 1 mg via INTRAVENOUS

## 2016-04-11 MED ORDER — HEPARIN (PORCINE) IN NACL 2-0.9 UNIT/ML-% IJ SOLN
INTRAMUSCULAR | Status: AC
  Filled 2016-04-11: qty 1000

## 2016-04-11 MED ORDER — IOPAMIDOL (ISOVUE-370) INJECTION 76%
INTRAVENOUS | Status: AC
  Filled 2016-04-11: qty 100

## 2016-04-11 MED ORDER — VERAPAMIL HCL 2.5 MG/ML IV SOLN
INTRAVENOUS | Status: AC
  Filled 2016-04-11: qty 2

## 2016-04-11 MED ORDER — SODIUM CHLORIDE 0.9 % IV SOLN
INTRAVENOUS | Status: DC
  Administered 2016-04-11: 10:00:00 via INTRAVENOUS

## 2016-04-11 MED ORDER — MIDAZOLAM HCL 2 MG/2ML IJ SOLN
INTRAMUSCULAR | Status: AC
  Filled 2016-04-11: qty 2

## 2016-04-11 MED ORDER — SODIUM CHLORIDE 0.9% FLUSH: 3.0000 mL | Freq: Two times a day (BID) | INTRAVENOUS | Status: DC

## 2016-04-11 MED ORDER — FENTANYL CITRATE (PF) 100 MCG/2ML IJ SOLN
INTRAMUSCULAR | Status: AC
  Filled 2016-04-11: qty 2

## 2016-04-11 MED ORDER — HEPARIN SODIUM (PORCINE) 1000 UNIT/ML IJ SOLN
INTRAMUSCULAR | Status: DC | PRN
  Administered 2016-04-11: 6000 [IU] via INTRAVENOUS

## 2016-04-11 MED ORDER — FENTANYL CITRATE (PF) 100 MCG/2ML IJ SOLN
INTRAMUSCULAR | Status: DC | PRN
  Administered 2016-04-11 (×2): 25 ug via INTRAVENOUS

## 2016-04-11 SURGICAL SUPPLY — 14 items
CATH BALLN WEDGE 5F 110CM (CATHETERS) ×1 IMPLANT
CATH INFINITI 5 FR JL3.5 (CATHETERS) ×1 IMPLANT
CATH INFINITI 5FR ANG PIGTAIL (CATHETERS) ×1 IMPLANT
CATH INFINITI JR4 5F (CATHETERS) ×1 IMPLANT
DEVICE RAD COMP TR BAND LRG (VASCULAR PRODUCTS) ×1 IMPLANT
GLIDESHEATH SLEND SS 6F .021 (SHEATH) ×2 IMPLANT
GUIDEWIRE .025 260CM (WIRE) ×1 IMPLANT
GUIDEWIRE INQWIRE 1.5J.035X260 (WIRE) IMPLANT
INQWIRE 1.5J .035X260CM (WIRE) ×2
KIT HEART LEFT (KITS) ×2 IMPLANT
PACK CARDIAC CATHETERIZATION (CUSTOM PROCEDURE TRAY) ×2 IMPLANT
SYR MEDRAD MARK V 150ML (SYRINGE) ×2 IMPLANT
TRANSDUCER W/STOPCOCK (MISCELLANEOUS) ×2 IMPLANT
TUBING CIL FLEX 10 FLL-RA (TUBING) ×2 IMPLANT

## 2016-04-11 NOTE — Interval H&P Note (Signed)
History and Physical Interval Note:  04/11/2016 10:11 AM  [name]  has presented today for cardiac cath with the diagnosis of dyspnea. The various methods of treatment have been discussed with the patient and family. After consideration of risks, benefits and other options for treatment, the patient has consented to  Procedure(s): Right/Left Heart Cath and Coronary Angiography (N/A) as a surgical intervention .  The patient's history has been reviewed, patient examined, no change in status, stable for surgery.  I have reviewed the patient's chart and labs.  Questions were answered to the patient's satisfaction.    Cath Lab Visit (complete for each Cath Lab visit)  Clinical Evaluation Leading to the Procedure:   ACS: No.  Non-ACS:    Anginal Classification: CCS II  Anti-ischemic medical therapy: Maximal Therapy (2 or more classes of medications)  Non-Invasive Test Results: No non-invasive testing performed  Prior CABG: No previous CABG         Lauree Chandler

## 2016-04-11 NOTE — H&P (View-Only) (Signed)
HPI The patient presents for evaluation of edema and palpitations and cardiomyopathy.   Her last EF was 35%.  This has been stable since 2015.  I repeated this in August of last year.   She did have a stress perfusion study that did not demonstrate ischemia in 2015.  She had a sleep study.  She has been diagnosed with sleep apnea.  Since I last saw her she had had an office visit in which she had some pleuritic type chest pain and she has had increasing SOB.  She did have oxygen saturations in the office that were in the low 90s with ambulation.  However, she was very subjectively SOB.   She had a mildly elevated D Dimer and BNP.  She has had multiple phone calls to the office complaining of SOB.  We sent her to the ED.  I reviewed these records from last week.  She had a CT which demonstrated no PE and no edema or evidence of pneumonia.   POC troponin was negative.  She returns for follow up.  She continues to describe dyspnea with activities such as walking a short distance on level ground. She's not describing chest pressure or neck discomfort. She's not describing palpitations, presyncope or syncope. She does have some mild chronic lower leg swelling. She's not describing PND or orthopnea.  She does have some left arm discomfort when she is coughing that she feels down in her wrist.   Allergies  Allergen Reactions  . Fluoxetine Other (See Comments)    More depressed  . Cymbalta [Duloxetine Hcl] Other (See Comments)    depressed  . Duloxetine     REACTION: Sucidal thoughts  . Penicillins     REACTION: rash  . Prozac [Fluoxetine Hcl] Other (See Comments)    More depressed    Current Outpatient Prescriptions  Medication Sig Dispense Refill  . amLODipine (NORVASC) 10 MG tablet Take 1 tablet (10 mg total) by mouth daily. 90 tablet 3  . busPIRone (BUSPAR) 10 MG tablet TAKE 1 TABLET TWICE A DAY 180 tablet 0  . carvedilol (COREG) 12.5 MG tablet Take 1 tablet (12.5 mg total) by mouth 2 (two)  times daily with a meal. 180 tablet 3  . carvedilol (COREG) 25 MG tablet Take 1 tablet (25 mg total) by mouth 2 (two) times daily with a meal. 180 tablet 3  . clonazePAM (KLONOPIN) 1 MG tablet Take 1 tablet (1 mg total) by mouth 2 (two) times daily. 90 tablet 1  . cyclobenzaprine (FLEXERIL) 10 MG tablet Take 1/2 pill twice a day as needed for muscle spasm (Patient taking differently: Take 5 mg by mouth 2 (two) times daily as needed for muscle spasms. Take 1/2 pill twice a day as needed for muscle spasm) 30 tablet 0  . docusate sodium (COLACE) 100 MG capsule Take 1-2 tabs by mouth twice a day 120 capsule 4  . enalapril (VASOTEC) 20 MG tablet Take 1 tablet (20 mg total) by mouth 2 (two) times daily. 180 tablet 3  . furosemide (LASIX) 40 MG tablet Take 1 tablet (40 mg total) by mouth daily. Ok to take a extra tab daily as needed 180 tablet 3  . ibuprofen (ADVIL,MOTRIN) 800 MG tablet Take 1 tablet (800 mg total) by mouth every 8 (eight) hours as needed for headache or mild pain. 30 tablet 1  . lamoTRIgine (LAMICTAL) 200 MG tablet TAKE 1 TABLET EVERY DAY 90 tablet 0  . oxyCODONE-acetaminophen (PERCOCET) 7.5-325 MG tablet Take  1 tablet by mouth every 4 (four) hours as needed for severe pain.    . potassium chloride SA (KLOR-CON M20) 20 MEQ tablet Take 1 tablet (20 mEq total) by mouth daily. 90 tablet 3  . sertraline (ZOLOFT) 100 MG tablet TAKE 1 TABLET TWICE A DAY 180 tablet 0  . simvastatin (ZOCOR) 20 MG tablet Take 1 tablet (20 mg total) by mouth daily at 6 PM. 90 tablet 3   No current facility-administered medications for this visit.     Past Medical History:  Diagnosis Date  . ADHD   . Anxiety   . CARDIOMYOPATHY, PERIPARTUM, POSTPARTUM    a. EF previously 30-40%,  b.  resolved, EF 60%    c.  echo 10/2011:  mild LVH, EF 50%, mild LAE,   . CARPAL TUNNEL SYNDROME, RIGHT   . CHF (congestive heart failure) ([place])   . Chronic systolic CHF (congestive heart failure), NYHA class 3 ([place]) 12/06/2013  .  DEPRESSION   . Edema    a. LE venous dopplers negative for DVT 10/2011  . Essential hypertension, benign   . MIGRAINE HEADACHE   . OBESITY   . Sleep apnea     Past Surgical History:  Procedure Laterality Date  . child birth  2003  . CHOLECYSTECTOMY     ROS:  As stated in the HPI and negative for all other systems.  PHYSICAL EXAM BP 106/72   Pulse 84   Resp (!) 96   Ht 5\' 3"  (1.6 m)   Wt (!) 310 lb (140.6 kg)   BMI 54.91 kg/m  GENERAL:  Well appearing NECK:  No jugular venous distention, waveform within normal limits, carotid upstroke brisk and symmetric, no bruits, no thyromegaly LUNGS:  Clear to auscultation bilaterally BACK:  No CVA tenderness CHEST:  Unremarkable HEART:  PMI not displaced or sustained,S1 and S2 within normal limits, no S3, no S4, no clicks, no rubs, no murmurs ABD:  Flat, positive bowel sounds normal in frequency in pitch, no bruits, no rebound, no guarding, no midline pulsatile mass, no hepatomegaly, no splenomegaly, obese EXT:  2 plus pulses throughout, mild to trace left greater than right lower extremity edema, no cyanosis no clubbing NEURO: Nonfocal.     ASSESSMENT AND PLAN  DYSPNEA -  This is the predominant complaints.  However, there has been no evidence of decompensated CHF.  She did not have a clinical or Xray evidence for pneumonia or URI.  She has no evidence for PE.     Her weight is up 16 lbs since I saw her last year.  However, given the fact that she has the cardiomyopathy and worsening symptoms right and left heart catheterization is indicated. The patient understands that risks included but are not limited to stroke (1 in 1000), death (1 in 68), kidney failure [usually temporary] (1 in 500), bleeding (1 in 200), allergic reaction [possibly serious] (1 in 200).  The patient understands and agrees to proceed.  I will check another basic metabolic profile again today.  EDEMA - This will be evaluated as above. She's on a higher dose of Lasix  and she will continue with this.  CARDIOMYOPATHY, PERIPARTUM, POSTPARTUM -  As above  ESSENTIAL HYPERTENSION, BENIGN -  This is being managed in the context of treating his CHF.  No change in therapy is indicated.   ANXIETY DEPRESSION - This is ongoing.  She continues to have active therapy for this.   APNEA - She has been diagnosed with sleep  apnea. She has been prescribed CPAP.    BACK PAIN - I told her I will defer management to her primary provider.   ED records reviewed for this visit.

## 2016-04-11 NOTE — Discharge Instructions (Signed)

## 2016-04-12 ENCOUNTER — Encounter (HOSPITAL_COMMUNITY): Payer: Self-pay | Admitting: Cardiovascular Disease

## 2016-04-12 ENCOUNTER — Ambulatory Visit: Payer: Self-pay | Admitting: Cardiology

## 2016-04-17 ENCOUNTER — Ambulatory Visit (INDEPENDENT_AMBULATORY_CARE_PROVIDER_SITE_OTHER): Payer: Medicare Other | Admitting: Cardiology

## 2016-04-17 ENCOUNTER — Encounter: Payer: Self-pay | Admitting: Cardiology

## 2016-04-17 VITALS — BP 112/72 | HR 83 | Ht 63.0 in | Wt 307.0 lb

## 2016-04-17 DIAGNOSIS — I5023 Acute on chronic systolic (congestive) heart failure: Secondary | ICD-10-CM

## 2016-04-17 DIAGNOSIS — F909 Attention-deficit hyperactivity disorder, unspecified type: Secondary | ICD-10-CM

## 2016-04-17 DIAGNOSIS — G4733 Obstructive sleep apnea (adult) (pediatric): Secondary | ICD-10-CM | POA: Diagnosis not present

## 2016-04-17 DIAGNOSIS — IMO0001 Reserved for inherently not codable concepts without codable children: Secondary | ICD-10-CM

## 2016-04-17 DIAGNOSIS — Z79899 Other long term (current) drug therapy: Secondary | ICD-10-CM | POA: Diagnosis not present

## 2016-04-17 DIAGNOSIS — I428 Other cardiomyopathies: Secondary | ICD-10-CM

## 2016-04-17 DIAGNOSIS — Z8659 Personal history of other mental and behavioral disorders: Secondary | ICD-10-CM

## 2016-04-17 DIAGNOSIS — Z0389 Encounter for observation for other suspected diseases and conditions ruled out: Secondary | ICD-10-CM | POA: Diagnosis not present

## 2016-04-17 HISTORY — DX: Reserved for inherently not codable concepts without codable children: IMO0001

## 2016-04-17 LAB — BASIC METABOLIC PANEL
BUN: 20 mg/dL (ref 7–25)
CO2: 28 mmol/L (ref 20–31)
Calcium: 9.1 mg/dL (ref 8.6–10.4)
Chloride: 103 mmol/L (ref 98–110)
Creat: 1.01 mg/dL (ref 0.50–1.05)
Glucose, Bld: 94 mg/dL (ref 65–99)
Potassium: 4.5 mmol/L (ref 3.5–5.3)
Sodium: 140 mmol/L (ref 135–146)

## 2016-04-17 NOTE — Patient Instructions (Signed)
Medication Instructions:  Your physician recommends that you continue on your current medications as directed. Please refer to the Current Medication list given to you today.  If you need a refill on your cardiac medications before your next appointment, please call your pharmacy.  Labwork: BMP TODAY AT [facility] ON THE 1ST FLOOR  Follow-Up: Your physician recommends that you schedule a follow-up appointment in: [place]   Thank you for choosing [facility] at [facility]!!    Kerby Moors, LPN

## 2016-04-17 NOTE — Assessment & Plan Note (Signed)
Most recent EF was 25-30% by cath 04/11/16

## 2016-04-17 NOTE — Assessment & Plan Note (Signed)
Normal cors 04/11/16

## 2016-04-17 NOTE — Assessment & Plan Note (Signed)
BMI 54 

## 2016-04-17 NOTE — Assessment & Plan Note (Signed)
Seems stable

## 2016-04-17 NOTE — Progress Notes (Signed)
04/17/2016 [name]   1965-08-23  [id number]  Primary Physician Samantha Blinks, MD Primary Cardiologist: Dr [name]  HPI:  51 y/o morbidly obese female with a history of cardiomyopathy-presumed to be NICM.  She did have a stress perfusion study that did not demonstrate ischemia in 2015.  Her last EF was 35% by echo in August of 2017. She has been diagnosed with sleep apnea and is on C-pap. She saw [name] in the office 04/03/16. Labs were drawn including a D-dimer. This was elevated -0.85 and she was sent to the ED for a CT.  The CTA demonstrated no PE and no edema,  And no evidence of pneumonia.  POC troponin was negative.  Her BNP was slightly elevated 157, and on 04/04/16 [name] suggested she increase her lasix to 40 mg BID. She returned for follow up with Dr [name] 04/08/16.  She continued to describe dyspnea with activities such as walking a short distance on level ground. She did not describe chest pressure or neck discomfort, palpitations, presyncope or syncope. She does have some mild chronic lower leg swelling. Dr [name] felt it would be to proceed with a Rt and Lt heart cath and this was done 04/11/16. This revealed normal coronaries, EF 25-30%, mild pulmonary HTN, and elevated LVEDP. At cath it was suggested she increase her Lasix to 40 mg BID (though actually she was already on that dose).   She is in the office today for follow up. She is a difficult historian, often straying off topic. She admits she is doing a little better-less DOE. She attributes this to a diet she started. She has lost some wgt- 307 lbs today (she was 313 on 04/03/16). When I suggested this was secondary to increased Lasix and diuresis, she dismissed this.    Current Outpatient Prescriptions  Medication Sig Dispense Refill  . amLODipine (NORVASC) 10 MG tablet Take 1 tablet (10 mg total) by mouth daily. 90 tablet 3  . busPIRone (BUSPAR) 10 MG tablet TAKE 1 TABLET TWICE A DAY 180 tablet 0  .  carvedilol (COREG) 12.5 MG tablet Take 1 tablet (12.5 mg total) by mouth 2 (two) times daily with a meal. 180 tablet 3  . carvedilol (COREG) 25 MG tablet Take 1 tablet (25 mg total) by mouth 2 (two) times daily with a meal. 180 tablet 3  . clonazePAM (KLONOPIN) 1 MG tablet Take 1 tablet (1 mg total) by mouth 2 (two) times daily. 90 tablet 1  . cyclobenzaprine (FLEXERIL) 10 MG tablet Take 1/2 pill twice a day as needed for muscle spasm (Patient taking differently: Take 5 mg by mouth 2 (two) times daily as needed for muscle spasms. Take 1/2 pill twice a day as needed for muscle spasm) 30 tablet 0  . docusate sodium (COLACE) 100 MG capsule Take 1-2 tabs by mouth twice a day 120 capsule 4  . enalapril (VASOTEC) 20 MG tablet Take 1 tablet (20 mg total) by mouth 2 (two) times daily. 180 tablet 3  . furosemide (LASIX) 40 MG tablet Take 40 mg by mouth 2 (two) times daily.    [name] ibuprofen (ADVIL,MOTRIN) 800 MG tablet Take 1 tablet (800 mg total) by mouth every 8 (eight) hours as needed for headache or mild pain. 30 tablet 1  . lamoTRIgine (LAMICTAL) 200 MG tablet TAKE 1 TABLET EVERY DAY 90 tablet 0  . oxyCODONE-acetaminophen (PERCOCET) 7.5-325 MG tablet Take 1 tablet by mouth every 4 (four) hours as needed for severe pain.    [name]  potassium chloride SA (KLOR-CON M20) 20 MEQ tablet Take 1 tablet (20 mEq total) by mouth daily. 90 tablet 3  . sertraline (ZOLOFT) 100 MG tablet TAKE 1 TABLET TWICE A DAY 180 tablet 0  . simvastatin (ZOCOR) 20 MG tablet Take 1 tablet (20 mg total) by mouth daily at 6 PM. 90 tablet 3   No current facility-administered medications for this visit.     Allergies  Allergen Reactions  . Fluoxetine Other (See Comments)    More depressed  . Cymbalta [Duloxetine Hcl] Other (See Comments)    depressed  . Duloxetine     REACTION: Sucidal thoughts  . Penicillins     REACTION: rash  . Prozac [Fluoxetine Hcl] Other (See Comments)    More depressed    Social History   Social History    . Marital status: Married    Spouse name: N/A  . Number of children: N/A  . Years of education: N/A   Occupational History  . Not on file.   Social History Main Topics  . Smoking status: Current Every Day Smoker    Packs/day: 0.50    Types: Cigarettes    Last attempt to quit: 02/25/1997  . Smokeless tobacco: Never Used     Comment: Married, lives with spouse (when he is not traveling) and son  . Alcohol use No  . Drug use: No  . Sexual activity: Not Currently   Other Topics Concern  . Not on file   Social History Narrative  . No narrative on file     Review of Systems: General: negative for chills, fever, night sweats or weight changes.  Cardiovascular: negative for chest pain, edema, orthopnea, palpitations, paroxysmal nocturnal dyspnea  Dermatological: negative for rash Respiratory: negative for cough or wheezing Urologic: negative for hematuria Abdominal: negative for nausea, vomiting, diarrhea, bright red blood per rectum, melena, or hematemesis Neurologic: negative for visual changes, syncope, or dizziness All other systems reviewed and are otherwise negative except as noted above.    Blood pressure 112/72, pulse 83, height 5\' 3"  (1.6 m), weight (!) 307 lb (139.3 kg).  General appearance: alert, cooperative, no distress and morbidly obese Neck: no carotid bruit and no JVD Lungs: clear to auscultation bilaterally Heart: regular rate and rhythm Extremities: no edema Skin: Skin color, texture, turgor normal. No rashes or lesions Neurologic: Grossly normal    ASSESSMENT AND PLAN:   Acute on chronic systolic heart failure (HCC) Recent LVEDP at cath with a slightly elevated BNP (only 139- not sure this will be a reliable marker for her).  She has diuresed 6 lbs since 04/03/16  Normal coronary arteries Normal cors 04/11/16  NICM (nonischemic cardiomyopathy) ([place]) Most recent EF was 25-30% by cath 04/11/16  Obstructive sleep apnea Reports compliance with  C-pap  Morbid obesity (HCC) BMI 54  History of depression Seems stable  Attention deficit hyperactivity disorder (ADHD) This was apparent to a slight degree during my interview   PLAN  I had a long discussion with Ms Daves and tried to explain the recent diagnostic tests and the results. She also told me she recently had a complete GI which was essential normal. She seems a little frustrated that her tests haven't turned up something new to explain her dyspnea. I don't think she is convinced she if still fluid overloaded.  I suggested she continue Lasix 40 mg BID for now and continue until her renal function bumps or she gets closer to her dry wgt.  After talking with  her I think her dry is around 295 lbs. She'll need a follow up echo in 3 months and possibly be considered for transition to [facility] once she is volume stable, and consideration for an ICD if her EF is still depressed.   I asked her if she had ever considered bariatric surgery and she said she won't do that. She tells me she has lost 100 lbs before on diet in the past and will do it again. She asked about a referral to a pulmonologist and a referral to [facility]" [facility] and I told I would discuss these issues with Dr [name]. She also wants a referral to another PCP.   Kerin Ransom PA-C 04/17/2016 12:38 PM

## 2016-04-17 NOTE — Assessment & Plan Note (Signed)
Recent LVEDP at cath with a slightly elevated BNP (only 139- not sure this will be a reliable marker for her).  She has diuresed 6 lbs since 04/03/16

## 2016-04-17 NOTE — Assessment & Plan Note (Signed)
Reports compliance with C-pap 

## 2016-04-17 NOTE — Assessment & Plan Note (Signed)
This was apparent to a slight degree during my interview

## 2016-04-18 ENCOUNTER — Telehealth: Payer: Self-pay | Admitting: Cardiology

## 2016-04-18 NOTE — Telephone Encounter (Signed)
Patient notified of preliminary BMET results - all values normal. Advised she will contacted if changes are warranted by provider.

## 2016-04-18 NOTE — Telephone Encounter (Signed)
New Message     Pt calling to get blood work results

## 2016-04-26 ENCOUNTER — Other Ambulatory Visit (HOSPITAL_COMMUNITY): Payer: Self-pay | Admitting: Psychiatry

## 2016-04-26 ENCOUNTER — Other Ambulatory Visit: Payer: Self-pay | Admitting: Family Medicine

## 2016-04-29 ENCOUNTER — Other Ambulatory Visit: Payer: Self-pay | Admitting: Emergency Medicine

## 2016-04-29 MED ORDER — IBUPROFEN 800 MG PO TABS: 800.0000 mg | ORAL_TABLET | Freq: Three times a day (TID) | ORAL | 1 refills | Status: DC | PRN

## 2016-04-29 NOTE — Telephone Encounter (Signed)
Received refill request for ibuprofen (ADVIL, MOTRIN) 800 MG tablet. Last office visit 08/31/15 and last refill 10/25/15. Is it ok to refill? Please advise.

## 2016-05-01 ENCOUNTER — Encounter: Payer: Self-pay | Admitting: *Deleted

## 2016-05-14 ENCOUNTER — Ambulatory Visit (HOSPITAL_COMMUNITY): Payer: Self-pay | Admitting: Psychiatry

## 2016-05-21 ENCOUNTER — Other Ambulatory Visit (HOSPITAL_COMMUNITY): Payer: Self-pay

## 2016-05-21 ENCOUNTER — Ambulatory Visit (INDEPENDENT_AMBULATORY_CARE_PROVIDER_SITE_OTHER): Payer: Medicare Other | Admitting: Psychiatry

## 2016-05-21 ENCOUNTER — Encounter (HOSPITAL_COMMUNITY): Payer: Self-pay | Admitting: Psychiatry

## 2016-05-21 VITALS — BP 108/70 | HR 88 | Ht 63.75 in | Wt 296.0 lb

## 2016-05-21 DIAGNOSIS — Z79899 Other long term (current) drug therapy: Secondary | ICD-10-CM

## 2016-05-21 DIAGNOSIS — Z888 Allergy status to other drugs, medicaments and biological substances status: Secondary | ICD-10-CM

## 2016-05-21 DIAGNOSIS — Z818 Family history of other mental and behavioral disorders: Secondary | ICD-10-CM | POA: Diagnosis not present

## 2016-05-21 DIAGNOSIS — F330 Major depressive disorder, recurrent, mild: Secondary | ICD-10-CM | POA: Diagnosis not present

## 2016-05-21 DIAGNOSIS — F17210 Nicotine dependence, cigarettes, uncomplicated: Secondary | ICD-10-CM

## 2016-05-21 DIAGNOSIS — Z811 Family history of alcohol abuse and dependence: Secondary | ICD-10-CM | POA: Diagnosis not present

## 2016-05-21 DIAGNOSIS — Z880 Allergy status to penicillin: Secondary | ICD-10-CM | POA: Diagnosis not present

## 2016-05-21 MED ORDER — CLONAZEPAM 1 MG PO TABS: ORAL_TABLET | ORAL | 0 refills | Status: DC

## 2016-05-21 MED ORDER — SERTRALINE HCL 100 MG PO TABS: ORAL_TABLET | ORAL | 0 refills | Status: DC

## 2016-05-21 MED ORDER — BUSPIRONE HCL 10 MG PO TABS: 10.0000 mg | ORAL_TABLET | Freq: Two times a day (BID) | ORAL | 0 refills | Status: DC

## 2016-05-22 NOTE — Progress Notes (Signed)
BH MD/PA/NP OP Progress Note  05/22/2016 3:54 PM [name]  MRN:  [id number]  Chief Complaint:  Returns for follow up - history of depression . Chief Complaint    Depression     Subjective: patient reports she has continued to experience some chronic depression, associated to chronic and persistent stressors- husband often travels to [facility] and abroad, sometimes without telling her , and leading her to carry home responsibilities by herself often. Other stressors are her adolescent son being diagnosed with  Asperger's Disorder and financial difficulties .   Objective : Patient had not returned since 8/17. She states she had been in [place] to visit family, and had also been dealing with some chronic medical symptoms- intermittent abdominal pain, discomfort, which she states was worked up with endoscopy, colonoscopy, with no findings. She reports she is doing "OK", and as above, reports history of chronic stressors, and states she tends to feel chronically anxious and depressed. She is functioning well in her activities, which include taking care of household and of her teenaged son. She denies any suicidal ideations. She does not endorse any significant neuro-vegetative symptoms of depression. At this time presents with a fully reactive affect . Patient reports that because she has not renewed medications recently she has either run out ( or has been taking less dose) of her medications  She is unsure of exact doses she is taking at this time. We reviewed at length. It appears that she is no longer taking Lamictal, and may have recently ran out of Zoloft, but is unsure . She states she still has some Klonopin, and states she takes one tablet ( 1 mgr) per day a few  times a week. She states she is no longer taking Adderall , which had been prescribed by another physician, and states she is not taking any Opiate analgesics either.  Visit Diagnosis: No diagnosis found.  Past Psychiatric History:  history of depression, anxiety  Past Medical History:  Past Medical History:  Diagnosis Date  . ADHD   . Anxiety   . CARDIOMYOPATHY, PERIPARTUM, POSTPARTUM    a. EF previously 30-40%,  b.  resolved, EF 60%    c.  echo 10/2011:  mild LVH, EF 50%, mild LAE,   . CARPAL TUNNEL SYNDROME, RIGHT   . CHF (congestive heart failure) ([place])   . Chronic systolic CHF (congestive heart failure), NYHA class 3 ([place]) 12/06/2013  . DEPRESSION   . Edema    a. LE venous dopplers negative for DVT 10/2011  . Essential hypertension, benign   . MIGRAINE HEADACHE   . OBESITY   . Sleep apnea     Past Surgical History:  Procedure Laterality Date  . child birth  2003  . CHOLECYSTECTOMY    . RIGHT/LEFT HEART CATH AND CORONARY ANGIOGRAPHY N/A 04/11/2016   Procedure: Right/Left Heart Cath and Coronary Angiography;  Surgeon: Burnell Blanks, MD;  Location: [facility];  Service: Cardiovascular;  Laterality: N/A;    Family Psychiatric History: non contributory   Family History:  Family History  Problem Relation Age of Onset  . Depression Mother   . Anxiety disorder Mother   . ADD / ADHD Father   . Hypertension Other   . Diabetes Other   . Colitis Other   . Alcohol abuse Other     Social History:  Social History   Social History  . Marital status: Married    Spouse name: N/A  . Number of children: N/A  .  Years of education: N/A   Social History Main Topics  . Smoking status: Current Every Day Smoker    Packs/day: 0.50    Types: Cigarettes    Last attempt to quit: 02/25/1997  . Smokeless tobacco: Never Used     Comment: Married, lives with spouse (when he is not traveling) and son  . Alcohol use No  . Drug use: No  . Sexual activity: Yes    Partners: Male    Birth control/ protection: None   Other Topics Concern  . None   Social History Narrative  . None    Allergies:  Allergies  Allergen Reactions  . Fluoxetine Other (See Comments)    More depressed  . Cymbalta  [Duloxetine Hcl] Other (See Comments)    depressed  . Duloxetine     REACTION: Sucidal thoughts  . Penicillins     REACTION: rash  . Prozac [Fluoxetine Hcl] Other (See Comments)    More depressed    Metabolic Disorder Labs: No results found for: HGBA1C, MPG No results found for: PROLACTIN Lab Results  Component Value Date   CHOL 155 11/08/2013   TRIG 148 11/08/2013   HDL 47 11/08/2013   CHOLHDL 3.3 11/08/2013   VLDL 30 11/08/2013   LDLCALC 78 11/08/2013   LDLCALC 148 (H) 05/09/2008     Current Medications: Current Outpatient Prescriptions  Medication Sig Dispense Refill  . amLODipine (NORVASC) 10 MG tablet Take 1 tablet (10 mg total) by mouth daily. 90 tablet 3  . carvedilol (COREG) 12.5 MG tablet Take 1 tablet (12.5 mg total) by mouth 2 (two) times daily with a meal. 180 tablet 3  . carvedilol (COREG) 25 MG tablet Take 1 tablet (25 mg total) by mouth 2 (two) times daily with a meal. 180 tablet 3  . enalapril (VASOTEC) 20 MG tablet Take 1 tablet (20 mg total) by mouth 2 (two) times daily. 180 tablet 3  . furosemide (LASIX) 40 MG tablet Take 40 mg by mouth 2 (two) times daily.    [name] ibuprofen (ADVIL,MOTRIN) 800 MG tablet Take 1 tablet (800 mg total) by mouth every 8 (eight) hours as needed for headache or mild pain. 30 tablet 1  . potassium chloride SA (KLOR-CON M20) 20 MEQ tablet Take 1 tablet (20 mEq total) by mouth daily. 90 tablet 3  . simvastatin (ZOCOR) 20 MG tablet Take 1 tablet (20 mg total) by mouth daily at 6 PM. 90 tablet 3  . busPIRone (BUSPAR) 10 MG tablet Take 1 tablet (10 mg total) by mouth 2 (two) times daily. 180 tablet 0  . clonazePAM (KLONOPIN) 1 MG tablet Take .5 mg qd prn anxiety 30 tablet 0  . cyclobenzaprine (FLEXERIL) 10 MG tablet Take 1/2 pill twice a day as needed for muscle spasm (Patient not taking: Reported on 05/21/2016) 30 tablet 0  . docusate sodium (COLACE) 100 MG capsule Take 1-2 tabs by mouth twice a day (Patient not taking: Reported on 05/21/2016)  120 capsule 4  . sertraline (ZOLOFT) 100 MG tablet 1 tablet po qd 90 tablet 0   No current facility-administered medications for this visit.     Neurologic: Headache: Negative Seizure: Negative Paresthesias: Negative  Musculoskeletal: Strength & Muscle Tone: within normal limits Gait & Station: normal Patient leans: N/A  Psychiatric Specialty Exam: ROS reports dyspnea on exertion , but no SOB at room air at this time, no chest pain   Blood pressure 108/70, pulse 88, height 5' 3.75" (1.619 m), weight 134.3 kg (296  lb), SpO2 95 %.Body mass index is 51.21 kg/m.  General Appearance: Well Groomed  Eye Contact:  Good  Speech:  Normal Rate  Volume:  Normal  Mood:  reports chronic depression, but states she is stable at this time  Affect:  Appropriate and Full Range  Thought Process:  Linear and Descriptions of Associations: Intact  Orientation:  Full (Time, Place, and Person)  Thought Content: no hallucinations, no delusions    Suicidal Thoughts:  No denies suicidal or self injurious ideations, denies any homicidal or violent ideations  Homicidal Thoughts:  No  Memory:  recent and remote grossly intact   Judgement:  Intact  Insight:  Present  Psychomotor Activity:  Normal  Concentration:  Concentration: Good and Attention Span: Good  Recall:  Good  Fund of Knowledge: Good  Language: Good  Akathisia:  Negative  Handed:  Right  AIMS (if indicated):   Assets:  Communication Skills Desire for Improvement Social Support  ADL's:  Intact  Cognition: WNL  Sleep:  Stable    Patient was last seen for follow up on 8/ 2017. At this time presents stable, with full range of affect and no significant neuro-vegetative symptoms. No SI. She has ran out of some of her medications. She has history of good response , tolerance to Buspar, Zoloft .  Treatment Plan Summary:Medication management and Plan will see in one month, agrees to contact me sooner if any worsening prior   Continue ZOLOFT  at 100 mgrs QDAY at this time- for depression Continue BUSPAR at 10 mgrs BID at this time- for anxiety  We discussed rationale to minimize long term use of BZDs if possible- at this time:   Continue KLONOPIN at 0.5 mgrs QDAY PRN for anxiety  Will not restart LAMICTAL at this time, as has been off it for a period of time and presents stable.  Jenne Campus, MD 05/22/2016, 3:54 PM

## 2016-05-23 NOTE — Progress Notes (Signed)
HPI The patient presents for evaluation of edema and palpitations and cardiomyopathy.   Her last EF was 35%.  This had been stable since 2015.  I repeated this in August of last year.   She did have a stress perfusion study that did not demonstrate ischemia in 2015.  She had a sleep study.  She has been diagnosed with sleep apnea.  She ws in the ED recently with SOB.   She had a CT which demonstrated no PE and no edema or evidence of pneumonia.   POC troponin was negative.  She had cath 04/11/16. This revealed normal coronaries, EF 25-30%, mild pulmonary HTN, and elevated LVEDP.  She returns for follow up.   She is doing better.  She is dieting and has lost weight.  Her breathing is improved but still not at baseline.  The patient denies any new symptoms such as chest discomfort, neck or arm discomfort. There have been no reported palpitations, presyncope or syncope.   Allergies  Allergen Reactions  . Fluoxetine Other (See Comments)    More depressed  . Cymbalta [Duloxetine Hcl] Other (See Comments)    depressed  . Duloxetine     REACTION: Sucidal thoughts  . Penicillins     REACTION: rash  . Prozac [Fluoxetine Hcl] Other (See Comments)    More depressed    Current Outpatient Prescriptions  Medication Sig Dispense Refill  . amLODipine (NORVASC) 10 MG tablet Take 1 tablet (10 mg total) by mouth daily. 90 tablet 3  . busPIRone (BUSPAR) 10 MG tablet Take 1 tablet (10 mg total) by mouth 2 (two) times daily. 180 tablet 0  . carvedilol (COREG) 12.5 MG tablet Take 1 tablet (12.5 mg total) by mouth 2 (two) times daily with a meal. 180 tablet 3  . carvedilol (COREG) 25 MG tablet Take 1 tablet (25 mg total) by mouth 2 (two) times daily with a meal. 180 tablet 3  . clonazePAM (KLONOPIN) 1 MG tablet Take .5 mg qd prn anxiety 30 tablet 0  . cyclobenzaprine (FLEXERIL) 10 MG tablet Take 1/2 pill twice a day as needed for muscle spasm 30 tablet 0  . docusate sodium (COLACE) 100 MG capsule Take 1-2 tabs  by mouth twice a day 120 capsule 4  . enalapril (VASOTEC) 20 MG tablet Take 1 tablet (20 mg total) by mouth 2 (two) times daily. 180 tablet 3  . furosemide (LASIX) 40 MG tablet Take 1 tablet (40 mg total) by mouth 2 (two) times daily. 180 tablet 3  . ibuprofen (ADVIL,MOTRIN) 800 MG tablet Take 1 tablet (800 mg total) by mouth every 8 (eight) hours as needed for headache or mild pain. 30 tablet 1  . potassium chloride SA (KLOR-CON M20) 20 MEQ tablet Take 1 tablet (20 mEq total) by mouth daily. 90 tablet 3  . sertraline (ZOLOFT) 100 MG tablet 1 tablet po qd 90 tablet 0  . simvastatin (ZOCOR) 20 MG tablet Take 1 tablet (20 mg total) by mouth daily at 6 PM. 90 tablet 3   No current facility-administered medications for this visit.     Past Medical History:  Diagnosis Date  . ADHD   . Anxiety   . CARDIOMYOPATHY, PERIPARTUM, POSTPARTUM    a. EF previously 30-40%,  b.  resolved, EF 60%    c.  echo 10/2011:  mild LVH, EF 50%, mild LAE,   . CARPAL TUNNEL SYNDROME, RIGHT   . CHF (congestive heart failure) ([place])   . Chronic systolic  CHF (congestive heart failure), NYHA class 3 ([place]) 12/06/2013  . DEPRESSION   . Edema    a. LE venous dopplers negative for DVT 10/2011  . Essential hypertension, benign   . MIGRAINE HEADACHE   . OBESITY   . Sleep apnea     Past Surgical History:  Procedure Laterality Date  . child birth  2003  . CHOLECYSTECTOMY    . RIGHT/LEFT HEART CATH AND CORONARY ANGIOGRAPHY N/A 04/11/2016   Procedure: Right/Left Heart Cath and Coronary Angiography;  Surgeon: Burnell Blanks, MD;  Location: [facility];  Service: Cardiovascular;  Laterality: N/A;   ROS:     As stated in the HPI and negative for all other systems.  PHYSICAL EXAM BP 123/82   Pulse 83   Ht 5' 3.5" (1.613 m)   Wt 299 lb 9.6 oz (135.9 kg)   BMI 52.24 kg/m  GENERAL:  Well appearing NECK:  No jugular venous distention, waveform within normal limits, carotid upstroke brisk and symmetric, no  bruits, no thyromegaly LUNGS:  Clear to auscultation bilaterally BACK:  No CVA tenderness CHEST:  Unremarkable HEART:  PMI not displaced or sustained,S1 and S2 within normal limits, no S3, no S4, no clicks, no rubs, no no murmurs ABD:  Flat, positive bowel sounds normal in frequency in pitch, no bruits, no rebound, no guarding, no midline pulsatile mass, no hepatomegaly, no splenomegaly, obese EXT:  2 plus pulses throughout, trace edema, no cyanosis no clubbing NEURO: Nonfocal.    EKG: NA  Lab Results  Component Value Date   TSH 5.45 (H) 04/08/2016     ASSESSMENT AND PLAN  DYSPNEA -  This is improved. However, she's not at baseline. Her pulmonary function testing.    OBESITY - I am very pleased with her weight loss. She will continue with more of the same.  EDEMA - This is improved.  No change in thein meds is indicated.   CARDIOMYOPATHY, PERIPARTUM, POSTPARTUM -  As above  ESSENTIAL HYPERTENSION, BENIGN -  This is being managed in the context of treating his CHF.  No change in therapy is indicated.   BP is at target  ANXIETY DEPRESSION -  This is ongoing.  She continues to have active therapy for this.   She did see a psychiatrist recently.   APNEA - She has been diagnosed with sleep apnea. She has been prescribed CPAP.   She says this works well  [place] - I told her I will defer management to her primary provider.  She wants a new PCP.  I gave her some names.

## 2016-05-24 ENCOUNTER — Ambulatory Visit (INDEPENDENT_AMBULATORY_CARE_PROVIDER_SITE_OTHER): Payer: Medicare Other | Admitting: Cardiology

## 2016-05-24 ENCOUNTER — Encounter: Payer: Self-pay | Admitting: Cardiology

## 2016-05-24 ENCOUNTER — Telehealth: Payer: Self-pay | Admitting: *Deleted

## 2016-05-24 VITALS — BP 123/82 | HR 83 | Ht 63.5 in | Wt 299.6 lb

## 2016-05-24 DIAGNOSIS — M7989 Other specified soft tissue disorders: Secondary | ICD-10-CM | POA: Diagnosis not present

## 2016-05-24 DIAGNOSIS — R0602 Shortness of breath: Secondary | ICD-10-CM | POA: Diagnosis not present

## 2016-05-24 DIAGNOSIS — I5022 Chronic systolic (congestive) heart failure: Secondary | ICD-10-CM

## 2016-05-24 MED ORDER — FUROSEMIDE 40 MG PO TABS: 40.0000 mg | ORAL_TABLET | Freq: Two times a day (BID) | ORAL | 3 refills | Status: DC

## 2016-05-24 NOTE — Patient Instructions (Signed)
If you need a refill on your cardiac medications before your next appointment, please call your pharmacy.  Labwork: NONE  Testing/Procedures: Your physician has recommended that you have a pulmonary function test. Pulmonary Function Tests are a group of tests that measure how well air moves in and out of your lungs.   Follow-Up: Your physician wants you to follow-up in: 6 MONTHS You will receive a reminder letter in the mail two months in advance. If you don't receive a letter, please call our office July 2018 to schedule the [facility] follow-up appointment.   Thank you for choosing [facility] at [facility], LPN

## 2016-05-24 NOTE — Telephone Encounter (Signed)
Cardiac rehab form faxed to [facility]

## 2016-06-17 ENCOUNTER — Telehealth: Payer: Self-pay | Admitting: Cardiology

## 2016-06-17 NOTE — Telephone Encounter (Signed)
Pt wants to know who was the doctor that Dr Warren Lacy wanted her to see for her breathing problems? She wanted you to make an appointment for her.

## 2016-06-17 NOTE — Telephone Encounter (Signed)
Returned the phone call to the patient. She needs her PFT to be scheduled. It has been routed to scheduling.

## 2016-06-18 ENCOUNTER — Encounter: Payer: Self-pay | Admitting: Family Medicine

## 2016-06-18 ENCOUNTER — Ambulatory Visit (INDEPENDENT_AMBULATORY_CARE_PROVIDER_SITE_OTHER): Payer: Medicare Other | Admitting: Family Medicine

## 2016-06-18 ENCOUNTER — Telehealth: Payer: Self-pay | Admitting: Cardiology

## 2016-06-18 VITALS — BP 128/88 | HR 84 | Temp 98.5°F | Ht 63.5 in | Wt 293.4 lb

## 2016-06-18 DIAGNOSIS — F331 Major depressive disorder, recurrent, moderate: Secondary | ICD-10-CM | POA: Diagnosis not present

## 2016-06-18 DIAGNOSIS — G4733 Obstructive sleep apnea (adult) (pediatric): Secondary | ICD-10-CM | POA: Diagnosis not present

## 2016-06-18 DIAGNOSIS — F900 Attention-deficit hyperactivity disorder, predominantly inattentive type: Secondary | ICD-10-CM

## 2016-06-18 DIAGNOSIS — F17200 Nicotine dependence, unspecified, uncomplicated: Secondary | ICD-10-CM | POA: Diagnosis not present

## 2016-06-18 DIAGNOSIS — I10 Essential (primary) hypertension: Secondary | ICD-10-CM

## 2016-06-18 DIAGNOSIS — I428 Other cardiomyopathies: Secondary | ICD-10-CM

## 2016-06-18 DIAGNOSIS — R0602 Shortness of breath: Secondary | ICD-10-CM | POA: Diagnosis not present

## 2016-06-18 DIAGNOSIS — E785 Hyperlipidemia, unspecified: Secondary | ICD-10-CM

## 2016-06-18 NOTE — Progress Notes (Signed)
[name] is a 51 y.o. female is here to [place].   History of Present Illness:   [name] CMA acting as scribe for Dr. Juleen China.  Shortness of Breath  This is a chronic problem. The problem occurs daily. The problem has been unchanged. Associated symptoms include neck pain. Pertinent negatives include no abdominal pain, chest pain, claudication, coryza, ear pain, fever, headaches, hemoptysis, leg pain, leg swelling, orthopnea, PND, rash, rhinorrhea, sore throat, sputum production, swollen glands, syncope, vomiting or wheezing. The symptoms are aggravated by any activity and lying flat. Risk factors include smoking. She has tried body position changes and rest for the symptoms. The treatment provided mild relief. Her past medical history is significant for allergies and a heart failure.   SEE A/P FOR OTHER CHRONIC ISSUES REVIEWED WITH PATIENT.  Health Maintenance Due  Topic Date Due  . TETANUS/TDAP  05/28/1984  . MAMMOGRAM  05/29/2015  . COLONOSCOPY  05/29/2015  . PAP SMEAR  08/08/2015   PMHx, SurgHx, SocialHx, Medications, and Allergies were reviewed in the Visit Navigator and updated as appropriate.   Past Medical History:  Diagnosis Date  . ADD (attention deficit disorder)   . Anxiety   . Cardiomyopathy, peripartum, postpartum   . Carpal tunnel syndrome of right wrist   . CHF (congestive heart failure) ([place])   . Chronic systolic CHF (congestive heart failure), NYHA class 3 ([place]) 12/06/2013  . Depression   . Essential hypertension, benign   . Migraines   . Morbid obesity ([place])   . OSA (obstructive sleep apnea)   . Plantar fasciitis   . Sleep apnea     Past Surgical History:  Procedure Laterality Date  . CHOLECYSTECTOMY    . RIGHT/LEFT HEART CATH AND CORONARY ANGIOGRAPHY N/A 04/11/2016   Procedure: Right/Left Heart Cath and Coronary Angiography;  Surgeon: Burnell Blanks, MD;  Location: [facility];  Service: Cardiovascular;  Laterality: N/A;      Family History  Problem Relation Age of Onset  . Depression Mother   . Anxiety disorder Mother   . Diabetes Mother   . Hypertension Mother   . ADD / ADHD Father   . Diabetes Father   . Hypertension Father   . Hypertension Other   . Diabetes Other   . Colitis Other   . Alcohol abuse Other    Social History  Substance Use Topics  . Smoking status: Current Every Day Smoker    Packs/day: 0.50    Types: Cigarettes    Last attempt to quit: 02/25/1997  . Smokeless tobacco: Never Used     Comment: Married, lives with spouse (when he is not traveling) and son  . Alcohol use No   Current Medications and Allergies:   .  amLODipine (NORVASC) 10 MG tablet, Take 1 tablet (10 mg total) by mouth daily., Disp: 90 tablet, Rfl: 3 .  busPIRone (BUSPAR) 10 MG tablet, Take 1 tablet (10 mg total) by mouth 2 (two) times daily., Disp: 180 tablet, Rfl: 0 .  carvedilol (COREG) 12.5 MG tablet, Take 1 tablet (12.5 mg total) by mouth 2 (two) times daily with a meal., Disp: 180 tablet, Rfl: 3 .  carvedilol (COREG) 25 MG tablet, Take 1 tablet (25 mg total) by mouth 2 (two) times daily with a meal., Disp: 180 tablet, Rfl: 3 .  clonazePAM (KLONOPIN) 1 MG tablet, Take .5 mg qd prn anxiety, Disp: 30 tablet, Rfl: 0 .  cyclobenzaprine (FLEXERIL) 10 MG tablet, Take 1/2 pill twice  a day as needed for muscle spasm, Disp: 30 tablet, Rfl: 0 .  docusate sodium (COLACE) 100 MG capsule, Take 1-2 tabs by mouth twice a day, Disp: 120 capsule, Rfl: 4 .  enalapril (VASOTEC) 20 MG tablet, Take 1 tablet (20 mg total) by mouth 2 (two) times daily., Disp: 180 tablet, Rfl: 3 .  furosemide (LASIX) 40 MG tablet, Take 1 tablet (40 mg total) by mouth 2 (two) times daily., Disp: 180 tablet, Rfl: 3 .  ibuprofen (ADVIL,MOTRIN) 800 MG tablet, Take 1 tablet (800 mg total) by mouth every 8 (eight) hours as needed for headache or mild pain. .  potassium chloride SA (KLOR-CON M20) 20 MEQ tablet, Take 1 tablet (20 mEq total) by mouth daily.,  Disp: 90 tablet, Rfl: 3 .  sertraline (ZOLOFT) 100 MG tablet, 1 tablet po qd, Disp: 90 tablet, Rfl: 0 .  simvastatin (ZOCOR) 20 MG tablet, Take 1 tablet (20 mg total) by mouth daily at 6 PM., Disp: 90 tablet, Rfl: 3  Allergies  Allergen Reactions  . Fluoxetine Other (See Comments)    More depressed  . Cymbalta [Duloxetine Hcl] Other (See Comments)    depressed  . Duloxetine     REACTION: Sucidal thoughts  . Penicillins     REACTION: rash  . Prozac [Fluoxetine Hcl] Other (See Comments)    More depressed   Review of Systems:   Review of Systems  Constitutional: Positive for malaise/fatigue. Negative for chills and fever.  HENT: Positive for congestion. Negative for ear pain, rhinorrhea, sinus pain and sore throat.   Eyes: Negative for blurred vision and double vision.  Respiratory: Positive for shortness of breath. Negative for cough, hemoptysis, sputum production and wheezing.   Cardiovascular: Negative for chest pain, palpitations, orthopnea, claudication, leg swelling, syncope and PND.  Gastrointestinal: Positive for nausea. Negative for abdominal pain, constipation, diarrhea and vomiting.       When driving.  Musculoskeletal: Positive for neck pain.       Chronic.  Skin: Negative for itching and rash.  Neurological: Negative for dizziness and headaches.  Psychiatric/Behavioral: Positive for depression. Negative for hallucinations and memory loss.       Being treated for depression.   Vitals:   Vitals:   06/18/16 1109  BP: 128/88  Pulse: 84  Temp: 98.5 F (36.9 C)  TempSrc: Oral  SpO2: 95%  Weight: 293 lb 6.4 oz (133.1 kg)  Height: 5' 3.5" (1.613 m)     Body mass index is 51.16 kg/m.  Physical Exam:   Physical Exam  Constitutional: She appears well-nourished.  HENT:  Head: Normocephalic and atraumatic.  Eyes: EOM are normal. Pupils are equal, round, and reactive to light.  Neck: Normal range of motion. Neck supple.  Cardiovascular: Normal rate, regular  rhythm, normal heart sounds and intact distal pulses.   Pulmonary/Chest: Effort normal.  Abdominal: Soft.  Skin: Skin is warm.  Psychiatric: She has a normal mood and affect. Her behavior is normal.  Nursing note and vitals reviewed.  Assessment and Plan:    Problem List Items Addressed This Visit      Cardiovascular and Mediastinum   Hypertension    Taking medications as prescribed without side effects.   Wt Readings from Last 3 Encounters:  06/18/16 293 lb 6.4 oz (133.1 kg)  05/24/16 299 lb 9.6 oz (135.9 kg)  04/17/16 (!) 307 lb (139.3 kg)   Reports that she has been smoking Cigarettes.  She has been smoking about 0.50 packs per day.  BP Readings from Last 3 Encounters:  06/18/16 128/88  05/24/16 123/82  04/17/16 112/72   Lab Results  Component Value Date   CREATININE 1.01 04/17/2016        NICM (nonischemic cardiomyopathy) ([place])    Catheterization on 04/11/16.   There is moderate left ventricular systolic dysfunction.  LV end diastolic pressure is moderately elevated.  The left ventricular ejection fraction is 25-35% by visual estimate.  There is no mitral valve regurgitation.  Hemodynamic findings consistent with mild pulmonary hypertension.   1. No angiographic evidence of CAD 2. Non-ischemic cardiomyopathy with elevated filling pressures.         Respiratory   Obstructive sleep apnea syndrome    CPAP compliant.         Other   Morbid obesity ([place])    BMI 51. 06/22/2016 The patient is asked to make an attempt to improve diet and exercise patterns to aid in medical management of this problem.       Dyslipidemia    Controlled on Zocor 20 mg daily. Due for lipid panel.      Major depressive disorder    Followed by [facility] at [facility]. Buspirone 10 MG BID, Sertraline 100 daily, Lamictal.  Hx: Has been severe, with 2 hospitalizations in past. Husband supportive when home-has an electronics store owned with his family in the [place], [place],  and he on occasion abruptly will leave to manage the store, creating upheavals at home. Her son has Asperger's and requires a good deal of support and management. She has a few very supportive friends, and keeps in touch with her family in [facility]). Was getting therapy at [facility] (Dr Bebe Shaggy) and patient got depressed "I could not get out of bed to get to my appointments" which led to a disruption in her care. She is trying to determine whether to re-establish at [facility] or get started with counseling in [place]. Dr Parke Poisson made some initial referrals for her but there were issues with insurance or copayments.No current decompensations, SI, and functional capacity relatively intact. Son's schooling currently an issue (at a charter school which is not meeting his needs).      Attention deficit disorder    I am okay with restarting the medication, with approval from Cardiology.      SOB (shortness of breath) - Primary    Ongoing issue. EF 25-35%. Current smoker. Morbidly obese. Deconditioned. Upcoming appointment with Pulmonology for PFTs.         . Reviewed expectations re: course of current medical issues. . Discussed self-management of symptoms. . Outlined signs and symptoms indicating need for more acute intervention. . Patient verbalized understanding and all questions were answered. . See orders for this visit as documented in the electronic medical record. . Patient received an After Visit Summary.  Records requested if needed. I spent 60 minutes with this patient, greater than 50% was face-to-face time counseling regarding the above diagnoses.  CMA served as Education administrator during this visit. History, Physical, and Plan performed by medical provider. Documentation and orders reviewed and attested to. Briscoe Deutscher, D.O.  Briscoe Deutscher, [place], [facility] [place] [facility] 06/22/2016   Follow-up: 2 weeks. NOTE: The patient has many complicated medical issues and is difficult  to redirect. Will meet more frequently until we are able to go over all concerns.   Meds ordered this encounter  Medications  . lamoTRIgine (LAMICTAL) 200 MG tablet    Sig: Take 200 mg by mouth daily.  There are no discontinued medications. No orders of the defined types were placed in this encounter.

## 2016-06-18 NOTE — Telephone Encounter (Signed)
Called patient regarding PFT test scheduled 07-09-16 at 9 a.m. At [facility] on [facility].  No inhalers 3 hours before, no smoking 1 hour before, no alcohol 4 hours before and no vigorous exercise 30 minutes before.  Also wear loose clothing.  Patient understood the directions.

## 2016-06-18 NOTE — Progress Notes (Signed)
Pre visit review using our clinic review tool, if applicable. No additional management support is needed unless otherwise documented below in the visit note. 

## 2016-06-22 ENCOUNTER — Encounter: Payer: Self-pay | Admitting: Family Medicine

## 2016-06-22 DIAGNOSIS — R0602 Shortness of breath: Secondary | ICD-10-CM | POA: Insufficient documentation

## 2016-06-22 DIAGNOSIS — Z87891 Personal history of nicotine dependence: Secondary | ICD-10-CM

## 2016-06-22 HISTORY — DX: Personal history of nicotine dependence: Z87.891

## 2016-06-22 NOTE — Assessment & Plan Note (Signed)
Controlled on Zocor 20 mg daily. Due for lipid panel.

## 2016-06-22 NOTE — Assessment & Plan Note (Signed)
BMI 51. 06/22/2016 The patient is asked to make an attempt to improve diet and exercise patterns to aid in medical management of this problem.

## 2016-06-22 NOTE — Assessment & Plan Note (Signed)
Followed by [facility] at [facility]. Buspirone 10 MG BID, Sertraline 100 daily, Lamictal.  Hx: Has been severe, with 2 hospitalizations in past. Husband supportive when home-has an electronics store owned with his family in the [place], [place], and he on occasion abruptly will leave to manage the store, creating upheavals at home. Her son has Asperger's and requires a good deal of support and management. She has a few very supportive friends, and keeps in touch with her family in [facility]). Was getting therapy at [facility] (Dr Bebe Shaggy) and patient got depressed "I could not get out of bed to get to my appointments" which led to a disruption in her care. She is trying to determine whether to re-establish at [facility] or get started with counseling in [place]. Dr Parke Poisson made some initial referrals for her but there were issues with insurance or copayments.No current decompensations, SI, and functional capacity relatively intact. Son's schooling currently an issue (at a charter school which is not meeting his needs).

## 2016-06-22 NOTE — Assessment & Plan Note (Signed)
Taking medications as prescribed without side effects.   Wt Readings from Last 3 Encounters:  06/18/16 293 lb 6.4 oz (133.1 kg)  05/24/16 299 lb 9.6 oz (135.9 kg)  04/17/16 (!) 307 lb (139.3 kg)   Reports that she has been smoking Cigarettes.  She has been smoking about 0.50 packs per day.   BP Readings from Last 3 Encounters:  06/18/16 128/88  05/24/16 123/82  04/17/16 112/72   Lab Results  Component Value Date   CREATININE 1.01 04/17/2016

## 2016-06-22 NOTE — Assessment & Plan Note (Addendum)
Ongoing issue. EF 25-35%. Current smoker. Morbidly obese. Deconditioned. Upcoming appointment with Pulmonology for PFTs.

## 2016-06-22 NOTE — Assessment & Plan Note (Signed)
Catheterization on 04/11/16.   There is moderate left ventricular systolic dysfunction.  LV end diastolic pressure is moderately elevated.  The left ventricular ejection fraction is 25-35% by visual estimate.  There is no mitral valve regurgitation.  Hemodynamic findings consistent with mild pulmonary hypertension.   1. No angiographic evidence of CAD 2. Non-ischemic cardiomyopathy with elevated filling pressures.

## 2016-06-22 NOTE — Assessment & Plan Note (Signed)
I am okay with restarting the medication, with approval from Cardiology.

## 2016-06-22 NOTE — Assessment & Plan Note (Signed)
I advised patient to quit smoking, and offered support. [facility] QUITLINE: 1-800-QUIT-NOW (1-800-784-8669).  

## 2016-06-22 NOTE — Assessment & Plan Note (Signed)
CPAP compliant.

## 2016-06-25 ENCOUNTER — Ambulatory Visit (HOSPITAL_COMMUNITY): Payer: Self-pay | Admitting: Psychiatry

## 2016-06-25 NOTE — Progress Notes (Signed)
Pre visit review using our clinic review tool, if applicable. No additional management support is needed unless otherwise documented below in the visit note. 

## 2016-06-25 NOTE — Progress Notes (Addendum)
Subjective:   [name] is a 51 y.o. female who presents for an Initial Medicare Annual Wellness Visit.  Pt sees psychiatrist monthly. Pt states she suffers from depression. Possible needs referral for therapist. Pt denies any suicidal thoughts or actions.  Pt would also like to be formally tested for ADD and Asperger's.  Review of Systems    No ROS.  Medicare Wellness Visit.  Cardiac Risk Factors include: dyslipidemia;hypertension;obesity (BMI >30kg/m2)   Sleep patterns: Goes to sleep at 11. Wakes up at 5:00 due to an extensive morning routine. States she feels tired all the time.   Living environment; residence and Firearm Safety: Lives with husband and son in [place]. Seat Belt Safety/Bike Helmet: Wears seat belt.   Counseling:   Eye Exam- Yearly. Dental- Yearly.  Female:   Pap-   Dr Mable Fill    Mammo-  Pt believes it has been over a year. Prefers to go to the [facility]. Will check with her OBGYN with date needed.            CCS- 02/12/16 Mild reflux esophagitis Sigmoid Diverticulosis, internal hemorrhoids     Objective:    Today's Vitals   06/26/16 1112  BP: 118/70  Pulse: 88  Resp: 16  SpO2: 96%  Weight: 295 lb (133.8 kg)  Height: 5\' 4"  (1.626 m)   Body mass index is 50.64 kg/m.   Current Medications (verified) Outpatient Encounter Prescriptions as of 06/26/2016  Medication Sig  . amLODipine (NORVASC) 10 MG tablet Take 1 tablet (10 mg total) by mouth daily.  . busPIRone (BUSPAR) 10 MG tablet Take 1 tablet (10 mg total) by mouth 2 (two) times daily.  . carvedilol (COREG) 12.5 MG tablet Take 1 tablet (12.5 mg total) by mouth 2 (two) times daily with a meal.  . carvedilol (COREG) 25 MG tablet Take 1 tablet (25 mg total) by mouth 2 (two) times daily with a meal.  . clonazePAM (KLONOPIN) 1 MG tablet Take .5 mg qd prn anxiety  . cyclobenzaprine (FLEXERIL) 10 MG tablet Take 1/2 pill twice a day as needed for muscle spasm  . docusate sodium (COLACE) 100 MG  capsule Take 1-2 tabs by mouth twice a day  . enalapril (VASOTEC) 20 MG tablet Take 1 tablet (20 mg total) by mouth 2 (two) times daily.  . furosemide (LASIX) 40 MG tablet Take 1 tablet (40 mg total) by mouth 2 (two) times daily.  [name] ibuprofen (ADVIL,MOTRIN) 800 MG tablet Take 1 tablet (800 mg total) by mouth every 8 (eight) hours as needed for headache or mild pain.  [name] lamoTRIgine (LAMICTAL) 200 MG tablet Take 200 mg by mouth daily.  . potassium chloride SA (KLOR-CON M20) 20 MEQ tablet Take 1 tablet (20 mEq total) by mouth daily.  . sertraline (ZOLOFT) 100 MG tablet 1 tablet po qd  . simvastatin (ZOCOR) 20 MG tablet Take 1 tablet (20 mg total) by mouth daily at 6 PM.   No facility-administered encounter medications on file as of 06/26/2016.     Allergies (verified) Fluoxetine; Cymbalta [duloxetine hcl]; Duloxetine; Penicillins; and Prozac [fluoxetine hcl]   History: Past Medical History:  Diagnosis Date  . ADD (attention deficit disorder)   . Anxiety   . Back pain   . Cardiomyopathy, peripartum, postpartum   . Carpal tunnel syndrome of right wrist   . CHF (congestive heart failure) ([place])   . Chronic systolic CHF (congestive heart failure), NYHA class 3 ([place]) 12/06/2013  . Depression   .  Essential hypertension, benign   . Migraines   . Morbid obesity ([place])   . OSA (obstructive sleep apnea)   . Plantar fasciitis   . Plantar fasciitis   . Sleep apnea   . Spinal stenosis   . Vaso vagal episode    Past Surgical History:  Procedure Laterality Date  . CHOLECYSTECTOMY    . RIGHT/LEFT HEART CATH AND CORONARY ANGIOGRAPHY N/A 04/11/2016   Procedure: Right/Left Heart Cath and Coronary Angiography;  Surgeon: Burnell Blanks, MD;  Location: [facility];  Service: Cardiovascular;  Laterality: N/A;   Family History  Problem Relation Age of Onset  . Depression Mother   . Anxiety disorder Mother   . Diabetes Mother   . Hypertension Mother   . ADD / ADHD Father   . Diabetes  Father   . Hypertension Father   . Hypertension Other   . Diabetes Other   . Colitis Other   . Alcohol abuse Other    Social History   Occupational History  . Not on file.   Social History Main Topics  . Smoking status: Current Every Day Smoker    Packs/day: 0.50    Types: Cigarettes    Last attempt to quit: 02/25/1997  . Smokeless tobacco: Never Used     Comment: Married, lives with spouse (when he is not traveling) and son  . Alcohol use No  . Drug use: No  . Sexual activity: Yes    Partners: Male    Birth control/ protection: None    Tobacco Counseling Ready to quit: Not Answered Counseling given: Not Answered   Activities of Daily Living In your present state of health, do you have any difficulty performing the following activities: 06/26/2016 04/11/2016  Hearing? N N  Vision? N N  Difficulty concentrating or making decisions? Y N  Walking or climbing stairs? Y N  Dressing or bathing? N N  Doing errands, shopping? N -  Preparing Food and eating ? N -  Using the Toilet? N -  In the past six months, have you accidently leaked urine? Y -  Do you have problems with loss of bowel control? N -  Managing your Medications? N -  Managing your Finances? N -  Housekeeping or managing your Housekeeping? N -  Some recent data might be hidden    Immunizations and Health Maintenance Immunization History  Administered Date(s) Administered  . Influenza Split 01/15/2011, 11/18/2011  . Influenza, High Dose Seasonal PF 01/15/2011, 11/18/2011, 11/27/2013, 03/30/2015  . Influenza,inj,Quad PF,36+ Mos 11/27/2013, 12/29/2015  . Pneumococcal Polysaccharide-23 01/15/2011   Health Maintenance Due  Topic Date Due  . TETANUS/TDAP  05/28/1984  . MAMMOGRAM  05/29/2015  . COLONOSCOPY  05/29/2015  . PAP SMEAR  08/08/2015    Patient Care Team: Briscoe Deutscher, DO as PCP - General (Family Medicine) Minus Breeding, MD as Consulting Physician (Cardiology)  Indicate any recent Medical  Services you may have received from other than [facility] providers in the past year (date may be approximate).     Assessment:   This is a routine wellness examination for [name].   Hearing/Vision screen Hearing Screening Comments: Able to hear conversational tones w/o difficulty. No issues reported.   Vision Screening Comments: Wears glasses.   Dietary issues and exercise activities discussed: Current Exercise Habits: The patient does not participate in regular exercise at present, Exercise limited by: respiratory conditions(s)   Diet (meal preparation, eat out, water intake, caffeinated beverages, dairy products, fruits and vegetables): states  she has been on a diet and eating 5 meals/day but the last week she has been stress eating and does not feel satisfied with food.      Goals    . Follow up with Pulmonologist       Depression Screen PHQ 2/9 Scores 06/26/2016 07/19/2015 07/05/2015  PHQ - 2 Score 4 2 0  PHQ- 9 Score 21 5 -  Exception Documentation - Other- indicate reason in comment box -  Not completed - Patient treated for depression and anxiety, Family HX -    Fall Risk Fall Risk  07/19/2015 07/05/2015  Falls in the past year? No Yes  Number falls in past yr: - 2 or more  Injury with Fall? - No    Cognitive Function:   Ad8 score reviewed for issues:  Issues making decisions:no  Less interest in hobbies / activities:no  Repeats questions, stories (family complaining):no  Trouble using ordinary gadgets (microwave, computer, phone):no  Forgets the month or year: no  Mismanaging finances: no  Remembering appts:no  Daily problems with thinking and/or memory:no Ad8 score is=0      Screening Tests Health Maintenance  Topic Date Due  . TETANUS/TDAP  05/28/1984  . MAMMOGRAM  05/29/2015  . COLONOSCOPY  05/29/2015  . PAP SMEAR  08/08/2015  . INFLUENZA VACCINE  09/25/2016  . HIV Screening  Completed      Plan:    Bring a copy of your advance directives to your  next office visit. Request records for [facility] and Dr Mable Fill.   I have personally reviewed and noted the following in the patient's chart:   . Medical and social history . Use of alcohol, tobacco or illicit drugs  . Current medications and supplements . Functional ability and status . Nutritional status . Physical activity . Advanced directives . List of other physicians . Vitals . Screenings to include cognitive, depression, and falls . Referrals and appointments  In addition, I have reviewed and discussed with patient certain preventive protocols, quality metrics, and best practice recommendations. A written personalized care plan for preventive services as well as general preventive health recommendations were provided to patient.     Ree Edman, RN   06/26/2016    I have personally reviewed the Medicare Annual Wellness questionnaire and have noted 1. The patient's medical and social history 2. Their use of alcohol, tobacco or illicit drugs 3. Their current medications and supplements 4. The patient's functional ability including ADL's, fall risks, home safety risks and hearing or visual impairment. 5. Diet and physical activities 6. Evidence for depression or mood disorders 7. Reviewed Updated provider list, see scanned forms and [facility] Snapshot.   The patients weight, height, BMI and visual acuity have been recorded in the chart I have made referrals, counseling and provided education to the patient based review of the above and I have provided the pt with a written personalized care plan for preventive services.  I have provided the patient with a copy of your personalized plan for preventive services. Instructed to take the time to review along with their updated medication list.   Briscoe Deutscher, D.O. [place], [facility]

## 2016-06-26 ENCOUNTER — Ambulatory Visit (INDEPENDENT_AMBULATORY_CARE_PROVIDER_SITE_OTHER): Payer: Medicare Other | Admitting: *Deleted

## 2016-06-26 ENCOUNTER — Other Ambulatory Visit (HOSPITAL_COMMUNITY): Payer: Self-pay

## 2016-06-26 ENCOUNTER — Encounter: Payer: Self-pay | Admitting: *Deleted

## 2016-06-26 ENCOUNTER — Encounter (HOSPITAL_COMMUNITY): Payer: Self-pay | Admitting: Psychiatry

## 2016-06-26 ENCOUNTER — Ambulatory Visit (INDEPENDENT_AMBULATORY_CARE_PROVIDER_SITE_OTHER): Payer: Medicare Other | Admitting: Psychiatry

## 2016-06-26 VITALS — BP 118/70 | HR 88 | Resp 16 | Ht 64.0 in | Wt 295.0 lb

## 2016-06-26 VITALS — BP 120/70 | HR 80 | Ht 64.0 in | Wt 296.0 lb

## 2016-06-26 DIAGNOSIS — Z79899 Other long term (current) drug therapy: Secondary | ICD-10-CM

## 2016-06-26 DIAGNOSIS — Z811 Family history of alcohol abuse and dependence: Secondary | ICD-10-CM | POA: Diagnosis not present

## 2016-06-26 DIAGNOSIS — F17210 Nicotine dependence, cigarettes, uncomplicated: Secondary | ICD-10-CM | POA: Diagnosis not present

## 2016-06-26 DIAGNOSIS — F3341 Major depressive disorder, recurrent, in partial remission: Secondary | ICD-10-CM

## 2016-06-26 DIAGNOSIS — Z0000 Encounter for general adult medical examination without abnormal findings: Secondary | ICD-10-CM

## 2016-06-26 DIAGNOSIS — Z818 Family history of other mental and behavioral disorders: Secondary | ICD-10-CM

## 2016-06-26 MED ORDER — CLONAZEPAM 1 MG PO TABS: ORAL_TABLET | ORAL | 0 refills | Status: DC

## 2016-06-26 NOTE — Patient Instructions (Addendum)
Bring a copy of your advance directives to your next office visit. Follow up with PCP as instructed.

## 2016-06-28 NOTE — Progress Notes (Signed)
[place] MD/PA/NP OP Progress Note  06/28/2016 6:14 PM [name]  MRN:  [id number]  Chief Complaint: return for medication management Subjective:  Overall doing well in daily activities. Has been feeling better physically. Continues to describe chronic stressors, mainly chronic financial difficulties, husband spending a lot of time away from home ( he is currently in [place]), and dealing with her adolescent son, who has Asperger's.  Reports medications are well tolerated, denies side effects. Objective :  Patient reports anxiety relating to above mentioned stressors, but overall has been doing well. Currently denies severe depression or significant neuro-vegetative symptoms of depression, denies any suicidal ideations, and presents euthymic, with an appropriate and reactive affect. Denies suicidal ideations . She had complained of intermittent but significant abdominal discomfort on her last visit, for which she had seen her PCP and Cardiologist. States this has improved and currently does not present uncomfortable or in any distress .  Visit Diagnosis:  MDD , no psychotic features  Past Psychiatric History: Depression  Past Medical History:  Past Medical History:  Diagnosis Date  . ADD (attention deficit disorder)   . Anxiety   . Back pain   . Cardiomyopathy, peripartum, postpartum   . Carpal tunnel syndrome of right wrist   . CHF (congestive heart failure) ([place])   . Chronic systolic CHF (congestive heart failure), NYHA class 3 ([place]) 12/06/2013  . Depression   . Essential hypertension, benign   . Migraines   . Morbid obesity ([place])   . OSA (obstructive sleep apnea)   . Plantar fasciitis   . Plantar fasciitis   . Sleep apnea   . Spinal stenosis   . Vaso vagal episode     Past Surgical History:  Procedure Laterality Date  . CHOLECYSTECTOMY    . RIGHT/LEFT HEART CATH AND CORONARY ANGIOGRAPHY N/A 04/11/2016   Procedure: Right/Left Heart Cath and Coronary Angiography;  Surgeon: Burnell Blanks, MD;  Location: [facility];  Service: Cardiovascular;  Laterality: N/A;    Family Psychiatric History: non contributory   Family History:  Family History  Problem Relation Age of Onset  . Depression Mother   . Anxiety disorder Mother   . Diabetes Mother   . Hypertension Mother   . ADD / ADHD Father   . Diabetes Father   . Hypertension Father   . Hypertension Other   . Diabetes Other   . Colitis Other   . Alcohol abuse Other     Social History:  Social History   Social History  . Marital status: Married    Spouse name: N/A  . Number of children: N/A  . Years of education: N/A   Social History Main Topics  . Smoking status: Current Some Day Smoker    Packs/day: 0.50    Types: Cigarettes    Last attempt to quit: 02/25/1997  . Smokeless tobacco: Never Used     Comment: Married, lives with spouse (when he is not traveling) and son  . Alcohol use No  . Drug use: No  . Sexual activity: Yes    Partners: Male    Birth control/ protection: None   Other Topics Concern  . None   Social History Narrative  . None    Allergies:  Allergies  Allergen Reactions  . Fluoxetine Other (See Comments)    More depressed  . Cymbalta [Duloxetine Hcl] Other (See Comments)    depressed  . Duloxetine     REACTION: Sucidal thoughts  . Penicillins  REACTION: rash  . Prozac [Fluoxetine Hcl] Other (See Comments)    More depressed    Metabolic Disorder Labs: No results found for: HGBA1C, MPG No results found for: PROLACTIN Lab Results  Component Value Date   CHOL 155 11/08/2013   TRIG 148 11/08/2013   HDL 47 11/08/2013   CHOLHDL 3.3 11/08/2013   VLDL 30 11/08/2013   LDLCALC 78 11/08/2013   LDLCALC 148 (H) 05/09/2008     Current Medications: Current Outpatient Prescriptions  Medication Sig Dispense Refill  . amLODipine (NORVASC) 10 MG tablet Take 1 tablet (10 mg total) by mouth daily. 90 tablet 3  . busPIRone (BUSPAR) 10 MG tablet Take 1 tablet (10 mg  total) by mouth 2 (two) times daily. 180 tablet 0  . carvedilol (COREG) 12.5 MG tablet Take 1 tablet (12.5 mg total) by mouth 2 (two) times daily with a meal. 180 tablet 3  . carvedilol (COREG) 25 MG tablet Take 1 tablet (25 mg total) by mouth 2 (two) times daily with a meal. 180 tablet 3  . clonazePAM (KLONOPIN) 1 MG tablet Take .5 mg qd prn anxiety 15 tablet 0  . cyclobenzaprine (FLEXERIL) 10 MG tablet Take 1/2 pill twice a day as needed for muscle spasm 30 tablet 0  . docusate sodium (COLACE) 100 MG capsule Take 1-2 tabs by mouth twice a day 120 capsule 4  . enalapril (VASOTEC) 20 MG tablet Take 1 tablet (20 mg total) by mouth 2 (two) times daily. 180 tablet 3  . furosemide (LASIX) 40 MG tablet Take 1 tablet (40 mg total) by mouth 2 (two) times daily. 180 tablet 3  . ibuprofen (ADVIL,MOTRIN) 800 MG tablet Take 1 tablet (800 mg total) by mouth every 8 (eight) hours as needed for headache or mild pain. 30 tablet 1  . lamoTRIgine (LAMICTAL) 200 MG tablet Take 200 mg by mouth daily.    . potassium chloride SA (KLOR-CON M20) 20 MEQ tablet Take 1 tablet (20 mEq total) by mouth daily. 90 tablet 3  . sertraline (ZOLOFT) 100 MG tablet 1 tablet po qd 90 tablet 0  . simvastatin (ZOCOR) 20 MG tablet Take 1 tablet (20 mg total) by mouth daily at 6 PM. 90 tablet 3   No current facility-administered medications for this visit.     Neurologic: Headache: No Seizure: No Paresthesias: No  Musculoskeletal: Strength & Muscle Tone: within normal limits Gait & Station: normal Patient leans: N/A  Psychiatric Specialty Exam: ROS no current chest pain or shortness of breath, no vomiting   Blood pressure 120/70, pulse 80, height 5\' 4"  (1.626 m), weight 134.3 kg (296 lb).Body mass index is 50.81 kg/m.  General Appearance: Well Groomed  Eye Contact:  Good  Speech:  Normal Rate  Volume:  Normal  Mood:  improved, presents euthymic  Affect:  Full Range  Thought Process:  Linear and Descriptions of  Associations: Intact  Orientation:  Full (Time, Place, and Person)  Thought Content: no hallucinations, no delusions, ruminative about stressors   Suicidal Thoughts:  No denies suicidal or self injurious ideations, denies homicidal ideations  Homicidal Thoughts:  No  Memory:  recent and remote grossly intact   Judgement:  Good  Insight:  Good  Psychomotor Activity:  Normal  Concentration:  Concentration: Good and Attention Span: Good  Recall:  Good  Fund of Knowledge: Good  Language: Good  Akathisia:  Negative  Handed:  Right  AIMS (if indicated):  No abnormal movements noted or reported  Assets:  Communication  Skills Desire for Improvement Housing Resilience  ADL's:  Intact  Cognition: WNL  Sleep:  OK     Treatment Plan Summary:Medication management Will see in 4-56 weeks Patient agrees to contact me sooner if any worsening prior Buspar 10 mgrs BID for anxiety, and as augmentation Lamictal 200 mgrs QDAY for mood disorder  Zoloft 100 mgrs QDAY for depression Klonopin 0.5 mgrs QDAY PRN for anxiety as needed   Jenne Campus, MD 06/28/2016, 6:14 PM

## 2016-07-09 ENCOUNTER — Telehealth: Payer: Self-pay | Admitting: Family Medicine

## 2016-07-09 ENCOUNTER — Ambulatory Visit (INDEPENDENT_AMBULATORY_CARE_PROVIDER_SITE_OTHER): Payer: Medicare Other | Admitting: Internal Medicine

## 2016-07-09 DIAGNOSIS — R0602 Shortness of breath: Secondary | ICD-10-CM | POA: Diagnosis not present

## 2016-07-09 LAB — PULMONARY FUNCTION TEST
DL/VA % pred: 99 %
DL/VA: 4.72 ml/min/mmHg/L
DLCO cor % pred: 69 %
DLCO cor: 16.32 ml/min/mmHg
DLCO unc % pred: 72 %
DLCO unc: 17.2 ml/min/mmHg
FEF 25-75 Post: 2.27 L/sec
FEF 25-75 Pre: 1.91 L/sec
FEF2575-%Change-Post: 18 %
FEF2575-%Pred-Post: 84 %
FEF2575-%Pred-Pre: 71 %
FEV1-%Change-Post: 2 %
FEV1-%Pred-Post: 75 %
FEV1-%Pred-Pre: 73 %
FEV1-Post: 2.06 L
FEV1-Pre: 2.01 L
FEV1FVC-%Change-Post: 3 %
FEV1FVC-%Pred-Pre: 101 %
FEV6-%Change-Post: -3 %
FEV6-%Pred-Post: 71 %
FEV6-%Pred-Pre: 73 %
FEV6-Post: 2.41 L
FEV6-Pre: 2.48 L
FEV6FVC-%Pred-Post: 103 %
FEV6FVC-%Pred-Pre: 103 %
FVC-%Change-Post: 0 %
FVC-%Pred-Post: 71 %
FVC-%Pred-Pre: 71 %
FVC-Post: 2.46 L
FVC-Pre: 2.48 L
Post FEV1/FVC ratio: 84 %
Post FEV6/FVC ratio: 100 %
Pre FEV1/FVC ratio: 81 %
Pre FEV6/FVC Ratio: 100 %
RV % pred: 80 %
RV: 1.43 L
TLC % pred: 76 %
TLC: 3.79 L

## 2016-07-09 NOTE — Telephone Encounter (Signed)
ROI fax to Dr. Norman Clay office

## 2016-07-09 NOTE — Progress Notes (Signed)
PFT done today. 

## 2016-07-12 ENCOUNTER — Telehealth: Payer: Self-pay | Admitting: Family Medicine

## 2016-07-12 NOTE — Telephone Encounter (Signed)
Rec'd from [facility] forward 3 pages to [place] DO

## 2016-07-12 NOTE — Telephone Encounter (Signed)
Rec'd from [facility] forward 21 pages to [employer] DO

## 2016-07-15 ENCOUNTER — Telehealth: Payer: Self-pay | Admitting: *Deleted

## 2016-07-15 DIAGNOSIS — R942 Abnormal results of pulmonary function studies: Secondary | ICD-10-CM

## 2016-07-15 NOTE — Telephone Encounter (Signed)
Referral for Pulmonologist placed

## 2016-07-15 NOTE — Telephone Encounter (Signed)
-----   Message from Minus Breeding, MD sent at 07/14/2016  6:21 PM EDT ----- Please refer to pulmonary.

## 2016-07-18 ENCOUNTER — Ambulatory Visit (INDEPENDENT_AMBULATORY_CARE_PROVIDER_SITE_OTHER): Payer: Medicare Other | Admitting: Family Medicine

## 2016-07-18 ENCOUNTER — Encounter: Payer: Self-pay | Admitting: Family Medicine

## 2016-07-18 ENCOUNTER — Ambulatory Visit (INDEPENDENT_AMBULATORY_CARE_PROVIDER_SITE_OTHER): Payer: Medicare Other

## 2016-07-18 VITALS — BP 118/76 | HR 78 | Temp 98.0°F | Ht 63.5 in | Wt 296.4 lb

## 2016-07-18 DIAGNOSIS — R1013 Epigastric pain: Secondary | ICD-10-CM

## 2016-07-18 DIAGNOSIS — Z13220 Encounter for screening for lipoid disorders: Secondary | ICD-10-CM | POA: Diagnosis not present

## 2016-07-18 DIAGNOSIS — I10 Essential (primary) hypertension: Secondary | ICD-10-CM

## 2016-07-18 DIAGNOSIS — R140 Abdominal distension (gaseous): Secondary | ICD-10-CM

## 2016-07-18 DIAGNOSIS — R0602 Shortness of breath: Secondary | ICD-10-CM

## 2016-07-18 DIAGNOSIS — K5900 Constipation, unspecified: Secondary | ICD-10-CM

## 2016-07-18 LAB — LIPID PANEL
Cholesterol: 204 mg/dL — ABNORMAL HIGH (ref 0–200)
HDL: 51.1 mg/dL (ref >39.00)
LDL Cholesterol: 128 mg/dL — ABNORMAL HIGH (ref 0–99)
NonHDL: 152.69
Total CHOL/HDL Ratio: 4
Triglycerides: 123 mg/dL (ref 0.0–149.0)
VLDL: 24.6 mg/dL (ref 0.0–40.0)

## 2016-07-18 MED ORDER — OMEPRAZOLE 20 MG PO CPDR: 20.0000 mg | DELAYED_RELEASE_CAPSULE | Freq: Every day | ORAL | 3 refills | Status: DC

## 2016-07-18 MED ORDER — PSYLLIUM 0.52 G PO CAPS: 0.5200 g | ORAL_CAPSULE | Freq: Every day | ORAL | 0 refills | Status: DC

## 2016-07-18 NOTE — Progress Notes (Signed)
[name] is a 51 y.o. female is here for follow up.  History of Present Illness:   [name] CMA acting as scribe for Dr. Juleen China.  CC: Patient comes in today for a follow up on her shortness of breath. PFT have been done and they come back abnormal. Patient stated that she thinks that it is her stomach that is causing the shortness of breath. She stated that the shortness of breath is intermittent.  Shortness of Breath  This is a chronic problem. The current episode started more than 1 month ago. The problem occurs daily. The problem has been waxing and waning. Associated symptoms include headaches. Pertinent negatives include no abdominal pain, chest pain, ear pain, fever, hemoptysis, leg pain, orthopnea, PND, rhinorrhea, sore throat, sputum production, syncope, vomiting or wheezing. The symptoms are aggravated by exercise, eating and lying flat. She has tried body position changes and rest for the symptoms. The treatment provided no relief. Her past medical history is significant for a heart failure.  Constipation  This is a recurrent problem. The problem has been waxing and waning since onset. Her stool frequency is 2 to 3 times per week. The stool is described as firm. The patient is not on a high fiber diet. She does not exercise regularly. There has not been adequate water intake. Associated symptoms include back pain. Pertinent negatives include no abdominal pain, diarrhea, fever, nausea or vomiting. Risk factors include obesity, immobility and stress. She has tried nothing for the symptoms.      Health Maintenance Due  Topic Date Due  . TETANUS/TDAP  05/28/1984  . MAMMOGRAM  05/29/2015  . PAP SMEAR  08/08/2015   PMHx, SurgHx, SocialHx, FamHx, Medications, and Allergies were reviewed in the Visit Navigator and updated as appropriate.   Patient Active Problem List   Diagnosis Date Noted  . SOB (shortness of breath) 06/22/2016  . Smoker 06/22/2016  . Normal coronary  arteries 04/17/2016  . Obstructive sleep apnea syndrome 10/28/2014  . Major depressive disorder 10/28/2014  . Chronic systolic CHF (congestive heart failure), NYHA class 3 ([place]) 12/06/2013  . Dyslipidemia 04/04/2011  . Hypertension 06/03/2008  . Morbid obesity ([place]) 07/12/2006  . NICM (nonischemic cardiomyopathy) ([place]) 07/12/2006  . Attention deficit disorder 07/12/2006   Social History  Substance Use Topics  . Smoking status: Current Some Day Smoker    Packs/day: 0.50    Types: Cigarettes    Last attempt to quit: 02/25/1997  . Smokeless tobacco: Never Used     Comment: Married, lives with spouse (when he is not traveling) and son  . Alcohol use No   Current Medications and Allergies:   .  amLODipine (NORVASC) 10 MG tablet, Take 1 tablet (10 mg total) by mouth daily., Disp: 90 tablet, Rfl: 3 .  busPIRone (BUSPAR) 10 MG tablet, Take 1 tablet (10 mg total) by mouth 2 (two) times daily., Disp: 180 tablet, Rfl: 0 .  carvedilol (COREG) 12.5 MG tablet, Take 1 tablet (12.5 mg total) by mouth 2 (two) times daily with a meal., Disp: 180 tablet, Rfl: 3 .  carvedilol (COREG) 25 MG tablet, Take 1 tablet (25 mg total) by mouth 2 (two) times daily with a meal., Disp: 180 tablet, Rfl: 3 .  clonazePAM (KLONOPIN) 1 MG tablet, Take .5 mg qd prn anxiety, Disp: 15 tablet, Rfl: 0 .  cyclobenzaprine (FLEXERIL) 10 MG tablet, Take 1/2 pill twice a day as needed for muscle spasm, Disp: 30 tablet, Rfl: 0 .  docusate sodium (  COLACE) 100 MG capsule, Take 1-2 tabs by mouth twice a day, Disp: 120 capsule, Rfl: 4 .  enalapril (VASOTEC) 20 MG tablet, Take 1 tablet (20 mg total) by mouth 2 (two) times daily., Disp: 180 tablet, Rfl: 3 .  furosemide (LASIX) 40 MG tablet, Take 1 tablet (40 mg total) by mouth 2 (two) times daily., Disp: 180 tablet, Rfl: 3 .  ibuprofen (ADVIL,MOTRIN) 800 MG tablet, Take 1 tablet (800 mg total) by mouth every 8 (eight) hours as needed for headache or mild pain. [name]  lamoTRIgine (LAMICTAL) 200  MG tablet, Take 200 mg by mouth daily., Disp: , Rfl:  .  potassium chloride SA (KLOR-CON M20) 20 MEQ tablet, Take 1 tablet (20 mEq total) by mouth daily., Disp: 90 tablet, Rfl: 3 .  sertraline (ZOLOFT) 100 MG tablet, 1 tablet po qd, Disp: 90 tablet, Rfl: 0 .  simvastatin (ZOCOR) 20 MG tablet, Take 1 tablet (20 mg total) by mouth daily at 6 PM.  Allergies  Allergen Reactions  . Fluoxetine Other (See Comments)    More depressed  . Cymbalta [Duloxetine Hcl] Other (See Comments)    depressed  . Duloxetine     REACTION: Sucidal thoughts  . Penicillins     REACTION: rash  . Prozac [Fluoxetine Hcl] Other (See Comments)    More depressed   Review of Systems   Review of Systems  Constitutional: Positive for malaise/fatigue. Negative for chills and fever.  HENT: Negative for ear pain, rhinorrhea, sinus pain and sore throat.   Eyes: Negative for blurred vision and double vision.  Respiratory: Positive for shortness of breath. Negative for cough, hemoptysis, sputum production and wheezing.   Cardiovascular: Negative for chest pain, palpitations, orthopnea, syncope and PND.  Gastrointestinal: Positive for constipation. Negative for abdominal pain, diarrhea, nausea and vomiting.  Musculoskeletal: Positive for back pain. Negative for joint pain.       Chronic.   Neurological: Positive for dizziness and headaches.  Psychiatric/Behavioral: Positive for depression. Negative for hallucinations and memory loss.    Vitals:   Vitals:   07/18/16 1033  BP: 118/76  Pulse: 78  Temp: 98 F (36.7 C)  TempSrc: Oral  SpO2: 97%  Weight: 296 lb 6.4 oz (134.4 kg)  Height: 5' 3.5" (1.613 m)     Body mass index is 51.68 kg/m.   Physical Exam:   Physical Exam  Constitutional: She appears well-developed and well-nourished. No distress.  HENT:  Head: Normocephalic and atraumatic.  Eyes: EOM are normal. Pupils are equal, round, and reactive to light.  Neck: Normal range of motion. Neck supple.    Cardiovascular: Normal rate, regular rhythm, normal heart sounds and intact distal pulses.   Pulmonary/Chest: Effort normal.  Abdominal: Soft. She exhibits distension. There is no guarding.  Neurological: Coordination abnormal.  Skin: Skin is warm.  Psychiatric: She has a normal mood and affect. Her behavior is normal.  Nursing note and vitals reviewed.   Results for orders placed or performed in visit on 07/18/16  Lipid panel  Result Value Ref Range   Cholesterol 204 (H) 0 - 200 mg/dL   Triglycerides 123.0 0.0 - 149.0 mg/dL   HDL 51.10 >39.00 mg/dL   VLDL 24.6 0.0 - 40.0 mg/dL   LDL Cholesterol 128 (H) 0 - 99 mg/dL   Total CHOL/HDL Ratio 4    NonHDL 152.69    EXAM: ABDOMEN - 1 VIEW  COMPARISON:  None.  FINDINGS: The bowel gas pattern is normal. Status post cholecystectomy. Phleboliths are noted  in the pelvis. Moderate amount of stool is seen throughout the colon.  IMPRESSION: Moderate stool burden is noted. No abnormal bowel dilatation is noted.  Assessment and Plan:   Jazman was seen today for follow-up.  Diagnoses and all orders for this visit:  Constipation, unspecified constipation type Comments: See AVS for management that was discussed. May be contributing to SOB. Orders: -     psyllium (METAMUCIL) 0.52 g capsule; Take 1 capsule (0.52 g total) by mouth daily.  Dyspepsia Comments: The patient mentions significant hunger, even after eating, associated with SOB and bloating. Will cover for possible gastritis/reflux. Orders: -     omeprazole (PRILOSEC) 20 MG capsule; Take 1 capsule (20 mg total) by mouth daily.  Need for lipid screening -     Lipid panel  Essential hypertension Comments: Well controlled.  No signs of complications, medication side effects, or red flags.  Continue current regimen.   Orders: -     Lipid panel  Abdominal bloating Comments: KUB with excess stool. Orders: -     DG Abd 1 View  SOB (shortness of  breath) Comments: Multifactorial. Recently with slightly abnormal PFTs. Hx of CHF. Deconditioned. Bowel distention. Some evidence of of reflux. Will maximize the last two. Will help to look into CardioPulm Rehab.     . Reviewed expectations re: course of current medical issues. . Discussed self-management of symptoms. . Outlined signs and symptoms indicating need for more acute intervention. . Patient verbalized understanding and all questions were answered. [name] Health Maintenance issues including appropriate healthy diet, exercise, and smoking avoidance were discussed with patient. . See orders for this visit as documented in the electronic medical record. . Patient received an After Visit Summary.  CMA served as Education administrator during this visit. History, Physical, and Plan performed by medical provider. The above documentation has been reviewed and is accurate and complete. Briscoe Deutscher, D.O.  Briscoe Deutscher, DO [place] [place] 07/21/2016  Future Appointments Date Time Provider [place]  07/23/2016 1:30 PM [place], Myer Peer, MD [facility] None  10/18/2016 9:45 AM Briscoe Deutscher, DO [facility] None  06/27/2017 10:00 AM Stephanie Acre, RN [facility] None

## 2016-07-20 ENCOUNTER — Other Ambulatory Visit (HOSPITAL_COMMUNITY): Payer: Self-pay | Admitting: Psychiatry

## 2016-07-21 NOTE — Patient Instructions (Signed)
MANAGEMENT OF CHRONIC CONSTIPATION   Push fluids, all day, everyday  Eat lots of high fiber foods-fruits, veggies, bran and whole grain instead of white bread  Be active everyday. Inactivity makes constipation worse.  Add psyllium daily (Metamucil) which comes in capsules now. Start very low dose and work up to recommended dose on bottle daily.  Stay away from Milk of Magnesia or any magnesium containing laxative, unless you need it to clear things out rarely. It is an addictive laxative and your gut will become dependent on it.  If that is not working, I would start Miralax, which you can buy in generic 17 gms daily. It's a powder and not an "addictive laxative". Take it every day and titrate the dose up or down to get the daily BM.  

## 2016-07-23 ENCOUNTER — Ambulatory Visit (HOSPITAL_COMMUNITY): Payer: Self-pay | Admitting: Psychiatry

## 2016-10-15 ENCOUNTER — Other Ambulatory Visit: Payer: Self-pay | Admitting: Cardiology

## 2016-10-15 ENCOUNTER — Other Ambulatory Visit (HOSPITAL_COMMUNITY): Payer: Self-pay | Admitting: Psychiatry

## 2016-10-17 ENCOUNTER — Other Ambulatory Visit (HOSPITAL_COMMUNITY): Payer: Self-pay

## 2016-10-17 MED ORDER — SERTRALINE HCL 100 MG PO TABS: ORAL_TABLET | ORAL | 0 refills | Status: DC

## 2016-10-18 ENCOUNTER — Other Ambulatory Visit: Payer: Self-pay

## 2016-10-18 ENCOUNTER — Ambulatory Visit (INDEPENDENT_AMBULATORY_CARE_PROVIDER_SITE_OTHER): Payer: Medicare Other | Admitting: Family Medicine

## 2016-10-18 ENCOUNTER — Encounter: Payer: Self-pay | Admitting: Family Medicine

## 2016-10-18 VITALS — BP 130/92 | HR 90 | Ht 63.0 in | Wt 330.4 lb

## 2016-10-18 DIAGNOSIS — I428 Other cardiomyopathies: Secondary | ICD-10-CM

## 2016-10-18 DIAGNOSIS — F331 Major depressive disorder, recurrent, moderate: Secondary | ICD-10-CM | POA: Diagnosis not present

## 2016-10-18 DIAGNOSIS — R198 Other specified symptoms and signs involving the digestive system and abdomen: Secondary | ICD-10-CM | POA: Diagnosis not present

## 2016-10-18 DIAGNOSIS — R300 Dysuria: Secondary | ICD-10-CM

## 2016-10-18 DIAGNOSIS — R635 Abnormal weight gain: Secondary | ICD-10-CM | POA: Diagnosis not present

## 2016-10-18 DIAGNOSIS — F902 Attention-deficit hyperactivity disorder, combined type: Secondary | ICD-10-CM

## 2016-10-18 DIAGNOSIS — M7702 Medial epicondylitis, left elbow: Secondary | ICD-10-CM

## 2016-10-18 DIAGNOSIS — F17200 Nicotine dependence, unspecified, uncomplicated: Secondary | ICD-10-CM

## 2016-10-18 LAB — URINALYSIS, ROUTINE W REFLEX MICROSCOPIC
Bilirubin Urine: NEGATIVE
Hgb urine dipstick: NEGATIVE
Ketones, ur: NEGATIVE
Leukocytes, UA: NEGATIVE
Nitrite: NEGATIVE
RBC / HPF: NONE SEEN (ref 0–?)
Specific Gravity, Urine: 1.025 (ref 1.000–1.030)
Urine Glucose: NEGATIVE
Urobilinogen, UA: 0.2 (ref 0.0–1.0)
pH: 5.5 (ref 5.0–8.0)

## 2016-10-18 LAB — POCT URINALYSIS DIPSTICK
Bilirubin, UA: NEGATIVE
Blood, UA: NEGATIVE
Glucose, UA: NEGATIVE
Ketones, UA: NEGATIVE
Leukocytes, UA: NEGATIVE
Nitrite, UA: NEGATIVE
Protein, UA: 15
Spec Grav, UA: >=1.03 — AB (ref 1.010–1.025)
Urobilinogen, UA: 0.2 E.U./dL
pH, UA: 6 (ref 5.0–8.0)

## 2016-10-18 LAB — POCT GLYCOSYLATED HEMOGLOBIN (HGB A1C): Hemoglobin A1C: 6

## 2016-10-18 MED ORDER — DICYCLOMINE HCL 20 MG PO TABS: 20.0000 mg | ORAL_TABLET | Freq: Four times a day (QID) | ORAL | 0 refills | Status: DC

## 2016-10-18 NOTE — Progress Notes (Signed)
[name] is a 51 y.o. female is here for follow up.  History of Present Illness:   [name] CMA acting as scribe for Dr. Juleen China.  HPI: Patient comes in today for a follow up.  Dysuria. Patient is having some urinary frequency and dysuria. She denies fever, chills, abdominal pain, and vaginal discharge.  SOB. Ongoing issue that waxes and wanes. It has increased lately. She denies CP, wheeze, cough, increased edema.   Stress. Patient got a call from the hospital in [place] about mother being in hospital 2 days ago. Patient stated that the hospital made it sound like her mother was dying. Patient was upset because of being so far away. Her mother is now out of ICU and in a private room. Patient was upset because her mothers nursing home did not call her. Patient talked about multiple childhood issues that upsets her.   Diarrhea. Patient stated that yesterday that she had diarrhea yesterday and was afraid to drive anywhere because of having to go to the bathroom. Later that day she felt like she was constipation. No issues today, but she fears that anxiety may cause some of her bowel issues. No prior diagnosis of IBS. She did improve after starting psyllium at our last visit.   Arm Pain. Left side, at elbow. Intermittent. Pain is worse with extension. No skin changes. Started shortly after lifting a heavy item recently. Patient stated that she seen a commercial on TV about IV dyes causing problems with muscle pain.   Weight gain. Patient stated that she has gained a lot of weight - 34 pounds since last visit. She stated that she does binge eat, has not exercised at all, spends most of her day on the sofa, and stopped her low carbohydrate diet.   Health Maintenance Due  Topic Date Due  . TETANUS/TDAP  05/28/1984  . MAMMOGRAM  05/29/2015  . PAP SMEAR  08/08/2015  . INFLUENZA VACCINE  09/25/2016   Depression screen [facility] 2/9 10/18/2016 06/26/2016 07/19/2015  Decreased Interest 2 2 1     Down, Depressed, Hopeless 3 2 1   PHQ - 2 Score 5 4 2   Altered sleeping 0 2 1  Tired, decreased energy 0 3 1  Change in appetite 1 3 1   Feeling bad or failure about yourself  1 3 0  Trouble concentrating 0 3 0  Moving slowly or fidgety/restless 0 3 0  Suicidal thoughts 1 0 0  PHQ-9 Score 8 21 5   Difficult doing work/chores Not difficult at all Extremely dIfficult Somewhat difficult  Some recent data might be hidden   PMHx, SurgHx, SocialHx, FamHx, Medications, and Allergies were reviewed in the Visit Navigator and updated as appropriate.   Patient Active Problem List   Diagnosis Date Noted  . CHF (congestive heart failure), NYHA class IV, acute on chronic, systolic ([place]) 28/36/6294  . Hyperglycemia 10/19/2016  . SOB (shortness of breath) 06/22/2016  . Smoker 06/22/2016  . Normal coronary arteries 04/17/2016  . Obstructive sleep apnea syndrome 10/28/2014  . Major depressive disorder 10/28/2014  . Chronic systolic CHF (congestive heart failure), NYHA class 3 ([place]) 12/06/2013  . Dyslipidemia 04/04/2011  . Hypertension 06/03/2008  . Morbid obesity ([place]) 07/12/2006  . NICM (nonischemic cardiomyopathy) ([place]) 07/12/2006  . Attention deficit disorder 07/12/2006   Social History  Substance Use Topics  . Smoking status: Current Some Day Smoker    Packs/day: 0.50    Types: Cigarettes    Last attempt to quit: 02/25/1997  .  Smokeless tobacco: Never Used     Comment: Married, lives with spouse (when he is not traveling) and son  . Alcohol use No   Current Medications and Allergies:   .  amLODipine (NORVASC) 10 MG tablet, Take 1 tablet (10 mg total) by mouth daily., Disp: 90 tablet, Rfl: 3 .  busPIRone (BUSPAR) 10 MG tablet, Take 1 tablet (10 mg total) by mouth 2 (two) times daily., Disp: 180 tablet, Rfl: 0 .  carvedilol (COREG) 25 MG tablet, Take 1 tablet (25 mg total) by mouth 2 (two) times daily with a meal., Disp: 180 tablet, Rfl: 3 .  clonazePAM (KLONOPIN) 1 MG tablet, Take .5 mg  qd prn anxiety, Disp: 15 tablet, Rfl: 0 .  docusate sodium (COLACE) 100 MG capsule, Take 1-2 tabs by mouth twice a day, Disp: 120 capsule, Rfl: 4 .  enalapril (VASOTEC) 20 MG tablet, Take 1 tablet (20 mg total) by mouth 2 (two) times daily., Disp: 180 tablet, Rfl: 3 .  furosemide (LASIX) 40 MG tablet, Take 1 tablet (40 mg total) by mouth 2 (two) times daily., Disp: 180 tablet, Rfl: 3 .  lamoTRIgine (LAMICTAL) 200 MG tablet, Take 200 mg by mouth daily., Disp: , Rfl:  .  omeprazole (PRILOSEC) 20 MG capsule, Take 1 capsule (20 mg total) by mouth daily. (Patient not taking: Reported on 10/18/2016), Disp: 30 capsule, Rfl: 3 .  potassium chloride SA (KLOR-CON M20) 20 MEQ tablet, Take 1 tablet (20 mEq total) by mouth daily., Disp: 90 tablet, Rfl: 3 .  psyllium (METAMUCIL) 0.52 g capsule, Take 1 capsule (0.52 g total) by mouth daily., Disp: 30 capsule, Rfl: 0 .  sertraline (ZOLOFT) 100 MG tablet, 1 tablet po qd, Disp: 90 tablet, Rfl: 0 .  simvastatin (ZOCOR) 20 MG tablet, Take 1 tablet (20 mg total) by mouth daily at 6 PM., Disp: 90 tablet, Rfl: 3    Allergies  Allergen Reactions  . Fluoxetine Other (See Comments)    More depressed  . Cymbalta [Duloxetine Hcl] Other (See Comments)    depressed  . Duloxetine     REACTION: Sucidal thoughts  . Penicillins     REACTION: rash Has patient had a PCN reaction causing immediate rash, facial/tongue/throat swelling, SOB or lightheadedness with hypotension: YES Has patient had a PCN reaction causing severe rash involving mucus membranes or skin necrosis: NO Has patient had a PCN reaction that required hospitalization: YES Has patient had a PCN reaction occurring within the last 10 years: NO If all of the above answers are "NO", then may proceed with Cephalosporin use.   . Prozac [Fluoxetine Hcl] Other (See Comments)    More depressed   Review of Systems   Pertinent items are noted in the HPI. Otherwise, ROS is negative.  Vitals:   Vitals:    10/18/16 0857  BP: (!) 130/92  Pulse: 90  SpO2: 94%  Weight: (!) 330 lb 6.4 oz (149.9 kg)  Height: 5\' 3"  (1.6 m)     Body mass index is 58.53 kg/m.   Physical Exam:   Physical Exam  Constitutional: She appears well-nourished.  HENT:  Head: Normocephalic and atraumatic.  Eyes: Pupils are equal, round, and reactive to light. EOM are normal.  Neck: Normal range of motion. Neck supple.  Cardiovascular: Normal rate, regular rhythm and intact distal pulses.   Pulmonary/Chest: Effort normal. No respiratory distress. She has no wheezes.  Abdominal: Soft. Bowel sounds are normal. There is no tenderness.  Musculoskeletal: Normal range of motion.  Left  medial epicondylitis.  Neurological: She is alert.  Skin: Skin is warm.  Psychiatric: She has a normal mood and affect. Her behavior is normal.  Nursing note and vitals reviewed.   Results for orders placed or performed in visit on 10/18/16  POCT urinalysis dipstick  Result Value Ref Range   Color, UA yellow    Clarity, UA clear    Glucose, UA negative    Bilirubin, UA negative    Ketones, UA negatve    Spec Grav, UA >=1.030 (A) 1.010 - 1.025   Blood, UA negative    pH, UA 6.0 5.0 - 8.0   Protein, UA 15 mg    Urobilinogen, UA 0.2 0.2 or 1.0 E.U./dL   Nitrite, UA negative    Leukocytes, UA Negative Negative   Assessment and Plan:   Lindamarie was seen today for follow-up.  Diagnoses and all orders for this visit:  Burning with urination Comments: Urine without LE/nitrites. Will hold treatment today and have the patient monitor her symptoms closely. Orders: -     Urinalysis, Routine w reflex microscopic  Weight gain Comments: Significant. Noticeable around abdomen since last visit.  Orders: -     POCT glycosylated hemoglobin (Hb A1C)  Medial epicondylitis of elbow, left Comments: Mild tendonitis +/- strain. Discussed symptomatic care, stretches, and exercises.   NICM (nonischemic cardiomyopathy) ([place]) Comments: Most  recent EF was 25-30% by cath 04/11/16.   Attention deficit hyperactivity disorder (ADHD), combined type Comments: Patient would like to restart Adderral. Will need clearance from Cardiology. I don't believe that they will give it.   Smoker Comments: Recommend smoking cessation.  Morbid obesity ([place]) Comments: Significant weight gain. Hx of CHF, but patient reports binging behavior and lack of movement. Will look into Bariatric treatment options for patient.   Moderate episode of recurrent major depressive disorder ([place]) Comments: Consider bipolar features and favor improved mood stabilizer treatment.   Alternating constipation and diarrhea Comments: Consider IBS. Trial Bentyl. Orders: -     dicyclomine (BENTYL) 20 MG tablet; Take 1 tablet (20 mg total) by mouth every 6 (six) hours. (Patient not taking: Reported on 10/19/2016)   . Reviewed expectations re: course of current medical issues. . Discussed self-management of symptoms. . Outlined signs and symptoms indicating need for more acute intervention. . Patient verbalized understanding and all questions were answered. [name] Health Maintenance issues including appropriate healthy diet, exercise, and smoking avoidance were discussed with patient. . See orders for this visit as documented in the electronic medical record. . Patient received an After Visit Summary.  CMA served as Education administrator during this visit. History, Physical, and Plan performed by medical provider. The above documentation has been reviewed and is accurate and complete. Briscoe Deutscher, D.O.  Briscoe Deutscher, DO [place] 10/19/2016  Future Appointments Date Time Provider [place]  01/13/2017 11:30 AM Briscoe Deutscher, DO [facility] None  06/27/2017 10:00 AM Riki Sheer, Tyler Aas, RN [facility] None

## 2016-10-19 ENCOUNTER — Observation Stay (HOSPITAL_COMMUNITY)
Admission: EM | Admit: 2016-10-19 | Discharge: 2016-10-20 | Disposition: A | Discharge status: Home / Self Care | Payer: Medicare Other | Attending: Internal Medicine | Admitting: Internal Medicine

## 2016-10-19 ENCOUNTER — Encounter (HOSPITAL_COMMUNITY): Payer: Self-pay | Admitting: Emergency Medicine

## 2016-10-19 ENCOUNTER — Emergency Department (HOSPITAL_COMMUNITY): Payer: Medicare Other

## 2016-10-19 DIAGNOSIS — I5089 Other heart failure: Secondary | ICD-10-CM | POA: Diagnosis not present

## 2016-10-19 DIAGNOSIS — Z79899 Other long term (current) drug therapy: Secondary | ICD-10-CM | POA: Insufficient documentation

## 2016-10-19 DIAGNOSIS — I110 Hypertensive heart disease with heart failure: Principal | ICD-10-CM | POA: Insufficient documentation

## 2016-10-19 DIAGNOSIS — F419 Anxiety disorder, unspecified: Secondary | ICD-10-CM | POA: Diagnosis not present

## 2016-10-19 DIAGNOSIS — Z6841 Body Mass Index (BMI) 40.0 and over, adult: Secondary | ICD-10-CM | POA: Insufficient documentation

## 2016-10-19 DIAGNOSIS — I509 Heart failure, unspecified: Secondary | ICD-10-CM | POA: Diagnosis not present

## 2016-10-19 DIAGNOSIS — G4733 Obstructive sleep apnea (adult) (pediatric): Secondary | ICD-10-CM | POA: Diagnosis not present

## 2016-10-19 DIAGNOSIS — R0602 Shortness of breath: Secondary | ICD-10-CM | POA: Diagnosis not present

## 2016-10-19 DIAGNOSIS — M5441 Lumbago with sciatica, right side: Secondary | ICD-10-CM | POA: Insufficient documentation

## 2016-10-19 DIAGNOSIS — I428 Other cardiomyopathies: Secondary | ICD-10-CM | POA: Diagnosis not present

## 2016-10-19 DIAGNOSIS — E1169 Type 2 diabetes mellitus with other specified complication: Secondary | ICD-10-CM | POA: Diagnosis present

## 2016-10-19 DIAGNOSIS — F17210 Nicotine dependence, cigarettes, uncomplicated: Secondary | ICD-10-CM | POA: Insufficient documentation

## 2016-10-19 DIAGNOSIS — E1159 Type 2 diabetes mellitus with other circulatory complications: Secondary | ICD-10-CM | POA: Diagnosis present

## 2016-10-19 DIAGNOSIS — I5023 Acute on chronic systolic (congestive) heart failure: Secondary | ICD-10-CM | POA: Diagnosis present

## 2016-10-19 DIAGNOSIS — I5022 Chronic systolic (congestive) heart failure: Secondary | ICD-10-CM

## 2016-10-19 DIAGNOSIS — E876 Hypokalemia: Secondary | ICD-10-CM | POA: Insufficient documentation

## 2016-10-19 DIAGNOSIS — K5900 Constipation, unspecified: Secondary | ICD-10-CM | POA: Insufficient documentation

## 2016-10-19 DIAGNOSIS — E785 Hyperlipidemia, unspecified: Secondary | ICD-10-CM | POA: Diagnosis present

## 2016-10-19 DIAGNOSIS — I700 Atherosclerosis of aorta: Secondary | ICD-10-CM | POA: Insufficient documentation

## 2016-10-19 DIAGNOSIS — F909 Attention-deficit hyperactivity disorder, unspecified type: Secondary | ICD-10-CM | POA: Diagnosis not present

## 2016-10-19 DIAGNOSIS — G43909 Migraine, unspecified, not intractable, without status migrainosus: Secondary | ICD-10-CM | POA: Insufficient documentation

## 2016-10-19 DIAGNOSIS — I152 Hypertension secondary to endocrine disorders: Secondary | ICD-10-CM | POA: Diagnosis present

## 2016-10-19 DIAGNOSIS — Z9989 Dependence on other enabling machines and devices: Secondary | ICD-10-CM | POA: Insufficient documentation

## 2016-10-19 DIAGNOSIS — F329 Major depressive disorder, single episode, unspecified: Secondary | ICD-10-CM | POA: Diagnosis not present

## 2016-10-19 DIAGNOSIS — I10 Essential (primary) hypertension: Secondary | ICD-10-CM

## 2016-10-19 DIAGNOSIS — Z880 Allergy status to penicillin: Secondary | ICD-10-CM | POA: Diagnosis not present

## 2016-10-19 DIAGNOSIS — R0682 Tachypnea, not elsewhere classified: Secondary | ICD-10-CM | POA: Diagnosis not present

## 2016-10-19 DIAGNOSIS — R739 Hyperglycemia, unspecified: Secondary | ICD-10-CM | POA: Diagnosis not present

## 2016-10-19 DIAGNOSIS — Z888 Allergy status to other drugs, medicaments and biological substances status: Secondary | ICD-10-CM | POA: Diagnosis not present

## 2016-10-19 DIAGNOSIS — Z87891 Personal history of nicotine dependence: Secondary | ICD-10-CM | POA: Diagnosis present

## 2016-10-19 DIAGNOSIS — R079 Chest pain, unspecified: Secondary | ICD-10-CM | POA: Diagnosis not present

## 2016-10-19 DIAGNOSIS — F988 Other specified behavioral and emotional disorders with onset usually occurring in childhood and adolescence: Secondary | ICD-10-CM | POA: Diagnosis not present

## 2016-10-19 LAB — TROPONIN I: Troponin I: <0.03 ng/mL (ref <0.03)

## 2016-10-19 LAB — CREATININE, SERUM
Creatinine, Ser: 0.93 mg/dL (ref 0.44–1.00)
GFR calc Af Amer: >60 mL/min (ref >60)
GFR calc non Af Amer: >60 mL/min (ref >60)

## 2016-10-19 LAB — COMPREHENSIVE METABOLIC PANEL
ALT: 31 U/L (ref 14–54)
AST: 31 U/L (ref 15–41)
Albumin: 3.2 g/dL — ABNORMAL LOW (ref 3.5–5.0)
Alkaline Phosphatase: 32 U/L — ABNORMAL LOW (ref 38–126)
Anion gap: 10 (ref 5–15)
BUN: 13 mg/dL (ref 6–20)
CO2: 25 mmol/L (ref 22–32)
Calcium: 8.4 mg/dL — ABNORMAL LOW (ref 8.9–10.3)
Chloride: 107 mmol/L (ref 101–111)
Creatinine, Ser: 0.85 mg/dL (ref 0.44–1.00)
GFR calc Af Amer: >60 mL/min (ref >60)
GFR calc non Af Amer: >60 mL/min (ref >60)
Glucose, Bld: 148 mg/dL — ABNORMAL HIGH (ref 65–99)
Potassium: 4.3 mmol/L (ref 3.5–5.1)
Sodium: 142 mmol/L (ref 135–145)
Total Bilirubin: 1.2 mg/dL (ref 0.3–1.2)
Total Protein: 6.5 g/dL (ref 6.5–8.1)

## 2016-10-19 LAB — CBC WITH DIFFERENTIAL/PLATELET
Basophils Absolute: 0 10*3/uL (ref 0.0–0.1)
Basophils Relative: 0 %
Eosinophils Absolute: 0.1 10*3/uL (ref 0.0–0.7)
Eosinophils Relative: 0 %
HCT: 40 % (ref 36.0–46.0)
Hemoglobin: 12.6 g/dL (ref 12.0–15.0)
Lymphocytes Relative: 8 %
Lymphs Abs: 1.3 10*3/uL (ref 0.7–4.0)
MCH: 27.1 pg (ref 26.0–34.0)
MCHC: 31.5 g/dL (ref 30.0–36.0)
MCV: 86 fL (ref 78.0–100.0)
Monocytes Absolute: 0.9 10*3/uL (ref 0.1–1.0)
Monocytes Relative: 6 %
Neutro Abs: 13.1 10*3/uL — ABNORMAL HIGH (ref 1.7–7.7)
Neutrophils Relative %: 86 %
Platelets: 227 10*3/uL (ref 150–400)
RBC: 4.65 MIL/uL (ref 3.87–5.11)
RDW: 15 % (ref 11.5–15.5)
WBC: 15.3 10*3/uL — ABNORMAL HIGH (ref 4.0–10.5)

## 2016-10-19 LAB — HEMOGLOBIN A1C
Hgb A1c MFr Bld: 6 % — ABNORMAL HIGH (ref 4.8–5.6)
Hgb A1c MFr Bld: 6 % — ABNORMAL HIGH (ref 4.8–5.6)
Mean Plasma Glucose: 125.5 mg/dL
Mean Plasma Glucose: 125.5 mg/dL

## 2016-10-19 LAB — CBC
HCT: 40.5 % (ref 36.0–46.0)
Hemoglobin: 13 g/dL (ref 12.0–15.0)
MCH: 27.4 pg (ref 26.0–34.0)
MCHC: 32.1 g/dL (ref 30.0–36.0)
MCV: 85.4 fL (ref 78.0–100.0)
Platelets: 252 10*3/uL (ref 150–400)
RBC: 4.74 MIL/uL (ref 3.87–5.11)
RDW: 15 % (ref 11.5–15.5)
WBC: 11.8 10*3/uL — ABNORMAL HIGH (ref 4.0–10.5)

## 2016-10-19 LAB — TSH: TSH: 0.804 u[IU]/mL (ref 0.350–4.500)

## 2016-10-19 LAB — BRAIN NATRIURETIC PEPTIDE: B Natriuretic Peptide: 296.2 pg/mL — ABNORMAL HIGH (ref 0.0–100.0)

## 2016-10-19 MED ORDER — TRAMADOL HCL 50 MG PO TABS
50.0000 mg | ORAL_TABLET | Freq: Four times a day (QID) | ORAL | Status: DC | PRN
Start: 2016-10-19 — End: 2016-10-20
  Administered 2016-10-20: 50 mg via ORAL
  Filled 2016-10-19: qty 1

## 2016-10-19 MED ORDER — METHYLPREDNISOLONE SODIUM SUCC 125 MG IJ SOLR
125.0000 mg | Freq: Once | INTRAMUSCULAR | Status: AC
  Administered 2016-10-19: 125 mg via INTRAVENOUS
  Filled 2016-10-19: qty 2

## 2016-10-19 MED ORDER — ENOXAPARIN SODIUM 40 MG/0.4ML ~~LOC~~ SOLN
40.0000 mg | SUBCUTANEOUS | Status: DC
  Administered 2016-10-19: 40 mg via SUBCUTANEOUS
  Filled 2016-10-19: qty 0.4

## 2016-10-19 MED ORDER — CARVEDILOL 25 MG PO TABS
25.0000 mg | ORAL_TABLET | Freq: Two times a day (BID) | ORAL | Status: DC
Start: 2016-10-20 — End: 2016-10-20
  Administered 2016-10-20 (×2): 25 mg via ORAL
  Filled 2016-10-19 (×2): qty 1

## 2016-10-19 MED ORDER — LORAZEPAM 2 MG/ML IJ SOLN
1.0000 mg | Freq: Once | INTRAMUSCULAR | Status: AC
  Administered 2016-10-19: 1 mg via INTRAVENOUS
  Filled 2016-10-19: qty 1

## 2016-10-19 MED ORDER — FUROSEMIDE 10 MG/ML IJ SOLN
80.0000 mg | Freq: Once | INTRAMUSCULAR | Status: AC
  Administered 2016-10-19: 80 mg via INTRAVENOUS
  Filled 2016-10-19: qty 8

## 2016-10-19 MED ORDER — CLONAZEPAM 0.5 MG PO TABS
0.5000 mg | ORAL_TABLET | Freq: Every day | ORAL | Status: DC | PRN
  Administered 2016-10-19: 0.5 mg via ORAL
  Filled 2016-10-19: qty 1

## 2016-10-19 MED ORDER — DICYCLOMINE HCL 20 MG PO TABS: 20.0000 mg | ORAL_TABLET | Freq: Four times a day (QID) | ORAL | Status: DC

## 2016-10-19 MED ORDER — SODIUM CHLORIDE 0.9% FLUSH
3.0000 mL | Freq: Two times a day (BID) | INTRAVENOUS | Status: DC
  Administered 2016-10-20: 3 mL via INTRAVENOUS

## 2016-10-19 MED ORDER — IOPAMIDOL (ISOVUE-370) INJECTION 76%
INTRAVENOUS | Status: AC
  Administered 2016-10-19: 100 mL via INTRAVENOUS
  Filled 2016-10-19: qty 100

## 2016-10-19 MED ORDER — ENALAPRIL MALEATE 20 MG PO TABS
20.0000 mg | ORAL_TABLET | Freq: Two times a day (BID) | ORAL | Status: DC
  Administered 2016-10-19 – 2016-10-20 (×2): 20 mg via ORAL
  Filled 2016-10-19 (×3): qty 1

## 2016-10-19 MED ORDER — ACETAMINOPHEN 650 MG RE SUPP: 650.0000 mg | Freq: Four times a day (QID) | RECTAL | Status: DC | PRN

## 2016-10-19 MED ORDER — DOCUSATE SODIUM 100 MG PO CAPS: 100.0000 mg | ORAL_CAPSULE | Freq: Every day | ORAL | Status: DC

## 2016-10-19 MED ORDER — SODIUM CHLORIDE 0.9% FLUSH: 3.0000 mL | INTRAVENOUS | Status: DC | PRN

## 2016-10-19 MED ORDER — SIMVASTATIN 20 MG PO TABS
20.0000 mg | ORAL_TABLET | Freq: Every day | ORAL | Status: DC
  Administered 2016-10-20: 20 mg via ORAL
  Filled 2016-10-19: qty 1

## 2016-10-19 MED ORDER — AMLODIPINE BESYLATE 10 MG PO TABS
10.0000 mg | ORAL_TABLET | Freq: Every day | ORAL | Status: DC
  Administered 2016-10-20: 10 mg via ORAL
  Filled 2016-10-19: qty 1

## 2016-10-19 MED ORDER — FUROSEMIDE 10 MG/ML IJ SOLN
80.0000 mg | Freq: Two times a day (BID) | INTRAMUSCULAR | Status: DC
  Administered 2016-10-19 – 2016-10-20 (×3): 80 mg via INTRAVENOUS
  Filled 2016-10-19 (×3): qty 8

## 2016-10-19 MED ORDER — SODIUM CHLORIDE 0.9% FLUSH
3.0000 mL | Freq: Two times a day (BID) | INTRAVENOUS | Status: DC
  Administered 2016-10-19 – 2016-10-20 (×2): 3 mL via INTRAVENOUS

## 2016-10-19 MED ORDER — SERTRALINE HCL 100 MG PO TABS
100.0000 mg | ORAL_TABLET | Freq: Every day | ORAL | Status: DC
  Administered 2016-10-20: 100 mg via ORAL
  Filled 2016-10-19: qty 1

## 2016-10-19 MED ORDER — AMPHETAMINE-DEXTROAMPHET ER 30 MG PO CP24: 30.0000 mg | ORAL_CAPSULE | Freq: Every day | ORAL | Status: DC

## 2016-10-19 MED ORDER — LAMOTRIGINE 25 MG PO TABS
200.0000 mg | ORAL_TABLET | Freq: Every day | ORAL | Status: DC
  Administered 2016-10-20: 200 mg via ORAL
  Filled 2016-10-19: qty 8

## 2016-10-19 MED ORDER — SODIUM CHLORIDE 0.9 % IV SOLN
INTRAVENOUS | Status: DC
  Administered 2016-10-19: 11:00:00 via INTRAVENOUS

## 2016-10-19 MED ORDER — BUSPIRONE HCL 10 MG PO TABS
10.0000 mg | ORAL_TABLET | Freq: Two times a day (BID) | ORAL | Status: DC
  Administered 2016-10-19 – 2016-10-20 (×2): 10 mg via ORAL
  Filled 2016-10-19 (×2): qty 1

## 2016-10-19 MED ORDER — SODIUM CHLORIDE 0.9 % IV SOLN: 250.0000 mL | INTRAVENOUS | Status: DC | PRN

## 2016-10-19 MED ORDER — CYCLOBENZAPRINE HCL 5 MG PO TABS: 5.0000 mg | ORAL_TABLET | Freq: Two times a day (BID) | ORAL | Status: DC | PRN

## 2016-10-19 MED ORDER — POLYETHYLENE GLYCOL 3350 17 G PO PACK: 17.0000 g | PACK | Freq: Every day | ORAL | Status: DC | PRN

## 2016-10-19 MED ORDER — PSYLLIUM 95 % PO PACK
1.0000 | PACK | Freq: Every day | ORAL | Status: DC
  Administered 2016-10-20: 1 via ORAL
  Filled 2016-10-19: qty 1

## 2016-10-19 MED ORDER — ACETAMINOPHEN 325 MG PO TABS: 650.0000 mg | ORAL_TABLET | Freq: Four times a day (QID) | ORAL | Status: DC | PRN

## 2016-10-19 MED ORDER — POTASSIUM CHLORIDE CRYS ER 20 MEQ PO TBCR
20.0000 meq | EXTENDED_RELEASE_TABLET | Freq: Every day | ORAL | Status: DC
  Administered 2016-10-20: 20 meq via ORAL
  Filled 2016-10-19: qty 1

## 2016-10-19 NOTE — ED Notes (Signed)
Attempted report 

## 2016-10-19 NOTE — ED Notes (Signed)
Pt ate a meal.

## 2016-10-19 NOTE — ED Notes (Signed)
Respiratory rate and ease of breathing has improved, pt able to talk without becoming short of breath.

## 2016-10-19 NOTE — H&P (Signed)
History and Physical    [name] JME:[id number] DOB: Apr 04, 1965 DOA: 10/19/2016  PCP: Briscoe Deutscher, DO  Patient coming from: Home  I have personally extensively reviewed patient's old medical records in [place]  Chief Complaint: Shortness of breath 3 days  HPI: [name] is a 51 y.o. female with medical history significant of pregnancy-induced cardiomyopathy, hypertension, ADHD, and super morbid obesity who presents from home complaining of shortness of breath that began 3 days ago. She complains of orthopnea which is increased in the number of pillows she uses, she has significant dyspnea on exertion as well. She denies any anginal chest pain. She has no cough or fever. She has a lot of stress and anxiety due to a special needs child. EMS was called today to the home and patient was found to be in extremis. She was given albuterol and Solu-Medrol and placed on a CPAP machine. She was transferred here for further evaluation. She states that she has had a 30 pound weight gain but she thinks it's because she went off her diet that's been over 3 weeks. She currently weighs more than 330 pounds. She is compliant with her medicine and recently saw her primary care physician just yesterday. She wanted to refer her for further testing for elevated blood glucoses. Don't think the shortness of breath was discussed at this meeting. Patient states that she is compliant with a fluid restriction drinking 1-1/2 L of water. Unfortunately she does not count ice cream or other semisolid liquids as a fluid in her fluid count. She does not weigh herself daily. She is extremely worried about getting home for her son. He must be at school on Monday and she is his only transportation. She worries that he may lose his financial aid for the private school he is currently attending.  She was diagnosed with pregnancy-induced cardiomyopathy 15 years ago. She has a great deal stress in her life and states she  has difficulty not stress eating. With regards to her cardiomyopathy she had a right and left heart catheterization in February 2018 which showed an ejection fraction of 25-35%. At that time plan was to increase her Lasix to 40 mg twice a day until she was to follow-up in the office. She remains on that dose.  ED Course: Patient was rapidly assessed and found to have COPD versus CHF. Chest x-ray showed small right and left pleural effusions and atelectasis. Her exam was so impressive that a CTA of the chest was obtained and she was found to have small right greater than left pleural effusions, atelectasis and scattered groundglass opacities consistent with fluid volume overload. Given her degree of shortness of breath, oxygen requirement which is new for her and the fact that she required CPAP on presentation she was referred to me for further evaluation and management.  Review of Systems: As per HPI otherwise complete review of systems negative.    Past Medical History:  Diagnosis Date  . ADD (attention deficit disorder)   . Anxiety   . Back pain   . Cardiomyopathy, peripartum, postpartum   . Carpal tunnel syndrome of right wrist   . CHF (congestive heart failure) ([place])   . Chronic systolic CHF (congestive heart failure), NYHA class 3 ([place]) 12/06/2013  . Depression   . Essential hypertension, benign   . Migraines   . Morbid obesity ([place])   . OSA (obstructive sleep apnea)   . Plantar fasciitis   . Plantar fasciitis   . Sleep  apnea   . Spinal stenosis   . Vaso vagal episode     Past Surgical History:  Procedure Laterality Date  . CHOLECYSTECTOMY    . RIGHT/LEFT HEART CATH AND CORONARY ANGIOGRAPHY N/A 04/11/2016   Procedure: Right/Left Heart Cath and Coronary Angiography;  Surgeon: Burnell Blanks, MD;  Location: [facility];  Service: Cardiovascular;  Laterality: N/A;     reports that she has been smoking Cigarettes.  She has been smoking about 0.50 packs per day. She  has never used smokeless tobacco. She reports that she does not drink alcohol or use drugs.  Allergies  Allergen Reactions  . Fluoxetine Other (See Comments)    More depressed  . Cymbalta [Duloxetine Hcl] Other (See Comments)    depressed  . Duloxetine     REACTION: Sucidal thoughts  . Penicillins     REACTION: rash  . Prozac [Fluoxetine Hcl] Other (See Comments)    More depressed    Family History  Problem Relation Age of Onset  . Depression Mother   . Anxiety disorder Mother   . Diabetes Mother   . Hypertension Mother   . ADD / ADHD Father   . Diabetes Father   . Hypertension Father   . Hypertension Other   . Diabetes Other   . Colitis Other   . Alcohol abuse Other      Prior to Admission medications   Medication Sig Start Date End Date Taking? Authorizing Provider  amLODipine (NORVASC) 10 MG tablet Take 1 tablet (10 mg total) by mouth daily. 10/16/15   Minus Breeding, MD  amphetamine-dextroamphetamine (ADDERALL XR) 30 MG 24 hr capsule Take 30 mg by mouth daily.    [provider]  busPIRone (BUSPAR) 10 MG tablet Take 1 tablet (10 mg total) by mouth 2 (two) times daily. 05/21/16   Cobos, Myer Peer, MD  carvedilol (COREG) 25 MG tablet Take 1 tablet (25 mg total) by mouth 2 (two) times daily with a meal. 10/16/15   Minus Breeding, MD  clonazePAM (KLONOPIN) 1 MG tablet Take .5 mg qd prn anxiety 06/26/16   Cobos, Myer Peer, MD  COREG 12.5 MG tablet TAKE 1 TABLET BY MOUTH TWICE A DAY WITH A MEAL 10/15/16   Minus Breeding, MD  cyclobenzaprine (FLEXERIL) 10 MG tablet Take 1/2 pill twice a day as needed for muscle spasm 08/31/15   Copland, Gay Filler, MD  dicyclomine (BENTYL) 20 MG tablet Take 1 tablet (20 mg total) by mouth every 6 (six) hours. 10/18/16   Briscoe Deutscher, DO  docusate sodium (COLACE) 100 MG capsule Take 1-2 tabs by mouth twice a day 12/29/15   Barrett, Evelene Croon, PA-C  enalapril (VASOTEC) 20 MG tablet Take 1 tablet (20 mg total) by mouth 2 (two) times daily.  10/16/15   Minus Breeding, MD  furosemide (LASIX) 40 MG tablet Take 1 tablet (40 mg total) by mouth 2 (two) times daily. 05/24/16   Minus Breeding, MD  ibuprofen (ADVIL,MOTRIN) 800 MG tablet Take 1 tablet (800 mg total) by mouth every 8 (eight) hours as needed for headache or mild pain. 04/29/16   Copland, Gay Filler, MD  lamoTRIgine (LAMICTAL) 200 MG tablet Take 200 mg by mouth daily.    [provider]  omeprazole (PRILOSEC) 20 MG capsule Take 1 capsule (20 mg total) by mouth daily. Patient not taking: Reported on 10/18/2016 07/18/16   Briscoe Deutscher, DO  potassium chloride SA (KLOR-CON M20) 20 MEQ tablet Take 1 tablet (20 mEq total) by  mouth daily. 10/16/15   Minus Breeding, MD  psyllium (METAMUCIL) 0.52 g capsule Take 1 capsule (0.52 g total) by mouth daily. 07/18/16   Briscoe Deutscher, DO  sertraline (ZOLOFT) 100 MG tablet 1 tablet po qd 10/17/16   Cobos, Myer Peer, MD  simvastatin (ZOCOR) 20 MG tablet Take 1 tablet (20 mg total) by mouth daily at 6 PM. 10/16/15   Minus Breeding, MD    Physical Exam: Vitals:   10/19/16 1400 10/19/16 1430 10/19/16 1500 10/19/16 1530  BP: 129/89 135/86 (!) 139/94 (!) 146/93  Pulse:  87 92 88  Resp: 19   (!) 25  Temp:      TempSrc:      SpO2:  95% 95% 96%  Weight:      Height:       .TCS Constitutional: NAD, calm, comfortable Vitals:   10/19/16 1400 10/19/16 1430 10/19/16 1500 10/19/16 1530  BP: 129/89 135/86 (!) 139/94 (!) 146/93  Pulse:  87 92 88  Resp: 19   (!) 25  Temp:      TempSrc:      SpO2:  95% 95% 96%  Weight:      Height:       Eyes: PERRL, lids and conjunctivae normal ENMT: Mucous membranes are moist. Posterior pharynx clear of any exudate or lesions.Normal dentition.  Neck: normal, supple, no masses, no thyromegaly, Unable to appreciate JVD Respiratory: Coarse breath sounds bilaterally, no wheezing, bilateral Rales at the bases one third of the way up. Increase respiratory effort. No accessory muscle use.  Cardiovascular:  Regular rate and rhythm, no murmurs / rubs / gallops. Bilateral lower extremity edema palpable below the calves and at the hips. 2+ pedal pulses. No carotid bruits.  Abdomen: no tenderness, no masses palpated. No hepatosplenomegaly. Bowel sounds positive.  Musculoskeletal: no clubbing / cyanosis. No joint deformity upper and lower extremities. Good ROM, no contractures. Normal muscle tone.  Skin: no rashes, lesions, ulcers. No induration Neurologic: CN 2-12 grossly intact. Sensation intact, DTR normal. Strength 5/5 in all 4.  Psychiatric: Normal judgment and insight. Alert and oriented x 3. Normal mood.     Labs on Admission: I have personally reviewed following labs and imaging studies  CBC:  Recent Labs Lab 10/19/16 1009  WBC 15.3*  NEUTROABS 13.1*  HGB 12.6  HCT 40.0  MCV 86.0  PLT 341   Basic Metabolic Panel:  Recent Labs Lab 10/19/16 1009  NA 142  K 4.3  CL 107  CO2 25  GLUCOSE 148*  BUN 13  CREATININE 0.85  CALCIUM 8.4*   GFR: Estimated Creatinine Clearance: 112.9 mL/min (by C-G formula based on SCr of 0.85 mg/dL). Liver Function Tests:  Recent Labs Lab 10/19/16 1009  AST 31  ALT 31  ALKPHOS 32*  BILITOT 1.2  PROT 6.5  ALBUMIN 3.2*   No results for input(s): LIPASE, AMYLASE in the last 168 hours. No results for input(s): AMMONIA in the last 168 hours. Coagulation Profile: No results for input(s): INR, PROTIME in the last 168 hours. Cardiac Enzymes:  Recent Labs Lab 10/19/16 1009  TROPONINI <0.03   BNP (last 3 results) No results for input(s): PROBNP in the last 8760 hours. HbA1C:  Recent Labs  10/18/16 1013  HGBA1C 6.0   CBG: No results for input(s): GLUCAP in the last 168 hours. Lipid Profile: No results for input(s): CHOL, HDL, LDLCALC, TRIG, CHOLHDL, LDLDIRECT in the last 72 hours. Thyroid Function Tests: No results for input(s): TSH, T4TOTAL, FREET4, T3FREE, THYROIDAB in  the last 72 hours. Anemia Panel: No results for input(s):  VITAMINB12, FOLATE, FERRITIN, TIBC, IRON, RETICCTPCT in the last 72 hours. Urine analysis:    Component Value Date/Time   COLORURINE YELLOW 10/18/2016 1010   APPEARANCEUR SL CLOUDY (A) 10/18/2016 1010   LABSPEC 1.025 10/18/2016 1010   PHURINE 5.5 10/18/2016 1010   GLUCOSEU NEGATIVE 10/18/2016 1010   HGBUR NEGATIVE 10/18/2016 1010   BILIRUBINUR NEGATIVE 10/18/2016 1010   BILIRUBINUR negative 10/18/2016 0921   KETONESUR NEGATIVE 10/18/2016 1010   PROTEINUR 15 mg 10/18/2016 0921   PROTEINUR NEGATIVE 07/04/2015 0014   UROBILINOGEN 0.2 10/18/2016 1010   NITRITE NEGATIVE 10/18/2016 1010   LEUKOCYTESUR NEGATIVE 10/18/2016 1010    Radiological Exams on Admission: Dg Chest 2 View  Result Date: 10/19/2016 CLINICAL DATA:  Shortness of breath. EXAM: CHEST  2 VIEW COMPARISON:  April 03, 2016 FINDINGS: Markedly limited study due to the low volume technique and patient body habitus. Haziness over the right chest is largely due to overlapping soft tissues. Cardiomegaly. The hila and mediastinum are unremarkable. No pneumothorax. No nodules or masses. No focal infiltrates. No overt edema. IMPRESSION: The study is markedly limited due to the low volume technique and patient body habitus. Haziness over the right chest is largely due to overlapping soft tissues. No overt edema. Electronically Signed   By: Dorise Bullion III M.D   On: 10/19/2016 11:36   Ct Angio Chest Pe W/cm &/or Wo Cm  Result Date: 10/19/2016 CLINICAL DATA:  Pt reports shortness of breath with hx of CHF. Pt states swelling throughout EXAM: CT ANGIOGRAPHY CHEST TECHNIQUE: Multidetector CT imaging through the chest was performed using the standard protocol during bolus administration of intravenous contrast. Multiplanar reconstructed images and MIPs were obtained and reviewed to evaluate the vascular anatomy. CONTRAST:  100 cc Isovue 370 COMPARISON:  04/04/2016 FINDINGS: CTA CHEST FINDINGS Cardiovascular: Right ventricle nondilated.  Satisfactory opacification of pulmonary arteries noted, and there is no evidence of pulmonary emboli. Adequate contrast opacification of the thoracic aorta with no evidence of dissection, aneurysm, or stenosis. There is classic 3-vessel brachiocephalic arch anatomy without proximal stenosis. Minimal plaque in the distal aortic arch. Mediastinum/Nodes: No pericardial effusion. No hilar or mediastinal adenopathy. Lungs/Pleura: Small pleural effusions right greater than left. Dependent atelectasis posteriorly in both lower lobes. Scattered geographic areas of mild ground-glass attenuation. Patient motion during the acquisition degrades fine detail. No pneumothorax. Musculoskeletal: Anterior vertebral endplate spurring at multiple levels in the mid and lower thoracic spine. No fracture or worrisome bone lesion. Review of the MIP images confirms the above findings. Upper abdomen:  No acute findings. IMPRESSION: 1. Negative for acute PE or thoracic aortic dissection. 2. Small pleural effusions and geographic mild ground-glass opacities throughout both lungs suggesting mild fluid overload. 3.  Aortic Atherosclerosis (ICD10-170.0) Electronically Signed   By: Lucrezia Europe M.D.   On: 10/19/2016 14:23    EKG: Independently reviewed. SR, LAE, abnormal RWP, LVH  Assessment/Plan Principal Problem:   CHF (congestive heart failure), NYHA class IV, acute on chronic, systolic (HCC) Active Problems:   Morbid obesity ([place])   Hypertension   Obstructive sleep apnea syndrome   Dyslipidemia   Major depressive disorder   Attention deficit disorder   Smoker   Hyperglycemia  1. Congestive heart failure [place] heart cessation class for acute on chronic systolic: Patient will be admitted into the hospital she is already on appropriate beta blockade, ACE inhibitor and lipid lowering medication. I believe the patient could benefit from reeducation about  congestive heart failure and management at home. She needs better  understanding of her fluid restriction as well as value of daily weights.  2. Morbid obesity: I spent a significant time discussing with the patient and anti-inflammatory low glycemic index diet for weight loss. We discussed an 18 hour day fast as well as appropriate choices for anti-inflammatory foods. She will need further discussion going forward.  3. Hypertension: Blood pressures were acceptable this morning's afternoon they have risen. Patient awaiting afternoon meds. We'll continue home medications.  4. Obstructive sleep apnea syndrome: Patient will use her CPAP from home. We'll ask respiratory to insure that she gets it.  5. Dyslipidemia: continue statin.  6. Major depressive disorder: continue Zoloft.  7. Attention deficit disorder: Patient would like to restart her Adderall. This will need to be cleared by cardiology.  8. Tobacco use: patient reports smoking approximately half a pack a day of cigarettes on some days. She is encouraged to quit smoking. Commence smoking cessation counseling prior to discharge  9. Hyperglycemia: Patient has had elevated blood glucoses on several readings. Will check a hemoglobin A1c.  Patient would very much like to discharge tomorrow as she is concerned about getting her son to school on Monday. She is placed in observation with the hopes of discharge tomorrow.  DVT prophylaxis: Lovenox Code Status: Full Family Communication: No family present. Disposition Plan: Home Consults called: None Admission status: Observation   Lady Deutscher MD, FACP [facility] Pager [phone]  If 7PM-7AM, please contact night-coverage [email or web address] Password [facility]  10/19/2016, 3:55 PM

## 2016-10-19 NOTE — ED Provider Notes (Addendum)
[facility] DEPT Provider Note   CSN: [id number] Arrival date & time: 10/19/16  4696     History   Chief Complaint Chief Complaint  Patient presents with  . Shortness of Breath    HPI [name] is a 51 y.o. female.  51 year old female with history of chronic systolic heart failure presents with shortness of breath 3 days. Describes orthopnea as well as dyspnea on exertion. Denies any anginal chest pain. No cough or fever. Does note increased stress and anxiety. Called EMS today and patient was found to be an extremis. She was given albuterol, Solu-Medrol as well as placed on Ci-pap And transported here.no further history obtainable due to her current condition      Past Medical History:  Diagnosis Date  . ADD (attention deficit disorder)   . Anxiety   . Back pain   . Cardiomyopathy, peripartum, postpartum   . Carpal tunnel syndrome of right wrist   . CHF (congestive heart failure) ([place])   . Chronic systolic CHF (congestive heart failure), NYHA class 3 ([place]) 12/06/2013  . Depression   . Essential hypertension, benign   . Migraines   . Morbid obesity ([place])   . OSA (obstructive sleep apnea)   . Plantar fasciitis   . Plantar fasciitis   . Sleep apnea   . Spinal stenosis   . Vaso vagal episode     Patient Active Problem List   Diagnosis Date Noted  . SOB (shortness of breath) 06/22/2016  . Smoker 06/22/2016  . Normal coronary arteries 04/17/2016  . Obstructive sleep apnea syndrome 10/28/2014  . Major depressive disorder 10/28/2014  . Chronic systolic CHF (congestive heart failure), NYHA class 3 ([place]) 12/06/2013  . Dyslipidemia 04/04/2011  . Hypertension 06/03/2008  . Morbid obesity ([place]) 07/12/2006  . NICM (nonischemic cardiomyopathy) ([place]) 07/12/2006  . Attention deficit disorder 07/12/2006    Past Surgical History:  Procedure Laterality Date  . CHOLECYSTECTOMY    . RIGHT/LEFT HEART CATH AND CORONARY ANGIOGRAPHY N/A 04/11/2016   Procedure: Right/Left  Heart Cath and Coronary Angiography;  Surgeon: Burnell Blanks, MD;  Location: [facility];  Service: Cardiovascular;  Laterality: N/A;    OB History    No data available       Home Medications    Prior to Admission medications   Medication Sig Start Date End Date Taking? Authorizing Provider  amLODipine (NORVASC) 10 MG tablet Take 1 tablet (10 mg total) by mouth daily. 10/16/15   Minus Breeding, MD  amphetamine-dextroamphetamine (ADDERALL XR) 30 MG 24 hr capsule Take 30 mg by mouth daily.    [provider]  busPIRone (BUSPAR) 10 MG tablet Take 1 tablet (10 mg total) by mouth 2 (two) times daily. 05/21/16   Cobos, Myer Peer, MD  carvedilol (COREG) 25 MG tablet Take 1 tablet (25 mg total) by mouth 2 (two) times daily with a meal. 10/16/15   Minus Breeding, MD  clonazePAM (KLONOPIN) 1 MG tablet Take .5 mg qd prn anxiety 06/26/16   Cobos, Myer Peer, MD  COREG 12.5 MG tablet TAKE 1 TABLET BY MOUTH TWICE A DAY WITH A MEAL 10/15/16   Minus Breeding, MD  cyclobenzaprine (FLEXERIL) 10 MG tablet Take 1/2 pill twice a day as needed for muscle spasm 08/31/15   Copland, Gay Filler, MD  dicyclomine (BENTYL) 20 MG tablet Take 1 tablet (20 mg total) by mouth every 6 (six) hours. 10/18/16   Briscoe Deutscher, DO  docusate sodium (COLACE) 100 MG capsule Take 1-2 tabs  by mouth twice a day 12/29/15   Barrett, Evelene Croon, PA-C  enalapril (VASOTEC) 20 MG tablet Take 1 tablet (20 mg total) by mouth 2 (two) times daily. 10/16/15   Minus Breeding, MD  furosemide (LASIX) 40 MG tablet Take 1 tablet (40 mg total) by mouth 2 (two) times daily. 05/24/16   Minus Breeding, MD  ibuprofen (ADVIL,MOTRIN) 800 MG tablet Take 1 tablet (800 mg total) by mouth every 8 (eight) hours as needed for headache or mild pain. 04/29/16   Copland, Gay Filler, MD  lamoTRIgine (LAMICTAL) 200 MG tablet Take 200 mg by mouth daily.    [provider]  omeprazole (PRILOSEC) 20 MG capsule Take 1 capsule (20 mg total) by mouth  daily. Patient not taking: Reported on 10/18/2016 07/18/16   Briscoe Deutscher, DO  potassium chloride SA (KLOR-CON M20) 20 MEQ tablet Take 1 tablet (20 mEq total) by mouth daily. 10/16/15   Minus Breeding, MD  psyllium (METAMUCIL) 0.52 g capsule Take 1 capsule (0.52 g total) by mouth daily. 07/18/16   Briscoe Deutscher, DO  sertraline (ZOLOFT) 100 MG tablet 1 tablet po qd 10/17/16   Cobos, Myer Peer, MD  simvastatin (ZOCOR) 20 MG tablet Take 1 tablet (20 mg total) by mouth daily at 6 PM. 10/16/15   Minus Breeding, MD    Family History Family History  Problem Relation Age of Onset  . Depression Mother   . Anxiety disorder Mother   . Diabetes Mother   . Hypertension Mother   . ADD / ADHD Father   . Diabetes Father   . Hypertension Father   . Hypertension Other   . Diabetes Other   . Colitis Other   . Alcohol abuse Other     Social History Social History  Substance Use Topics  . Smoking status: Current Some Day Smoker    Packs/day: 0.50    Types: Cigarettes    Last attempt to quit: 02/25/1997  . Smokeless tobacco: Never Used     Comment: Married, lives with spouse (when he is not traveling) and son  . Alcohol use No     Allergies   Fluoxetine; Cymbalta [duloxetine hcl]; Duloxetine; Penicillins; and Prozac [fluoxetine hcl]   Review of Systems Review of Systems  Unable to perform ROS: Acuity of condition     Physical Exam Updated Vital Signs BP 126/77   Pulse 87   Temp 98.5 F (36.9 C) (Temporal)   Resp (!) 21   Ht 1.6 m (5\' 3" )   Wt (!) 149.7 kg (330 lb)   SpO2 95%   BMI 58.46 kg/m   Physical Exam  Constitutional: She is oriented to person, place, and time. She appears well-developed and well-nourished.  Non-toxic appearance. No distress.  HENT:  Head: Normocephalic and atraumatic.  Eyes: Pupils are equal, round, and reactive to light. Conjunctivae, EOM and lids are normal.  Neck: Normal range of motion. Neck supple. No tracheal deviation present. No thyroid mass  present.  Cardiovascular: Normal rate, regular rhythm and normal heart sounds.  Exam reveals no gallop.   No murmur heard. Pulmonary/Chest: Effort normal. No stridor. No respiratory distress. She has decreased breath sounds in the right lower field and the left lower field. She has wheezes in the right lower field and the left lower field. She has no rhonchi. She has no rales.  Abdominal: Soft. Normal appearance and bowel sounds are normal. She exhibits no distension. There is no tenderness. There is no rebound and no CVA tenderness.  Musculoskeletal: Normal range of motion. She exhibits no edema or tenderness.  Lymphadenopathy:  3 plus bilateral le edema  Neurological: She is alert and oriented to person, place, and time. She has normal strength. No cranial nerve deficit or sensory deficit. GCS eye subscore is 4. GCS verbal subscore is 5. GCS motor subscore is 6.  Skin: Skin is warm and dry. No abrasion and no rash noted.  Psychiatric: Her behavior is normal. Her mood appears anxious. Her speech is rapid and/or pressured.  Nursing note and vitals reviewed.    ED Treatments / Results  Labs (all labs ordered are listed, but only abnormal results are displayed) Labs Reviewed  BRAIN NATRIURETIC PEPTIDE  CBC WITH DIFFERENTIAL/PLATELET  COMPREHENSIVE METABOLIC PANEL  TROPONIN I    EKG  EKG Interpretation  Date/Time:  Saturday October 19 2016 09:52:51 EDT Ventricular Rate:  87 PR Interval:    QRS Duration: 96 QT Interval:  383 QTC Calculation: 461 R Axis:   -23 Text Interpretation:  Sinus rhythm Probable left atrial enlargement Abnormal R-wave progression, late transition Left ventricular hypertrophy ST elev, probable normal early repol pattern No significant change since last tracing Confirmed by Lacretia Leigh ([id number]) on 10/19/2016 10:17:09 AM       Radiology No results found.  Procedures Procedures (including critical care time)  Medications Ordered in ED Medications  0.9 %   sodium chloride infusion (not administered)  LORazepam (ATIVAN) injection 1 mg (not administered)     Initial Impression / Assessment and Plan / ED Course  I have reviewed the triage vital signs and the nursing notes.  Pertinent labs & imaging results that were available during my care of the patient were reviewed by me and considered in my medical decision making (see chart for details).    Patient arrived on C-pap initially. Pulse oximetry pretty stable and she was taken off this therapy. Was placed on 4 L nasal cannula. She is also very anxious and medicated with Ativan 1 mg. Feels much better at this time. Patient's BNP is mildly elevated and CT consistent with fluid overload. Patient given Lasix 80 mg IV push and will admit to the hospitalist service.   CRITICAL CARE Performed by: Leota Jacobsen Total critical care time: 50 minutes Critical care time was exclusive of separately billable procedures and treating other patients. Critical care was necessary to treat or prevent imminent or life-threatening deterioration. Critical care was time spent personally by me on the following activities: development of treatment plan with patient and/or surrogate as well as nursing, discussions with consultants, evaluation of patient's response to treatment, examination of patient, obtaining history from patient or surrogate, ordering and performing treatments and interventions, ordering and review of laboratory studies, ordering and review of radiographic studies, pulse oximetry and re-evaluation of patient's condition.   Final Clinical Impressions(s) / ED Diagnoses   Final diagnoses:  SOB (shortness of breath)    New Prescriptions New Prescriptions   No medications on file     Lacretia Leigh, MD 10/19/16 1433    Lacretia Leigh, MD 10/19/16 [phone]

## 2016-10-19 NOTE — ED Triage Notes (Addendum)
Pt arrives on CPAP via EMS. Pt reports shortness of breath with hx of CHF. Pt states swelling throughout. Pt became short of breath with exertion when moving from stretcher to bed.  Pt arrives with 20G PIV placed to [place] by EMS attempts X2 with obvious infiltration to IV. Removed by me.  20GPIV placed to L lateral AC, good blood return noted. Pt trialed on nasal cannula, pt talking rapidly and frequently.  Pt states she stopped taking her Adderal.

## 2016-10-19 NOTE — Progress Notes (Signed)
New pt admission from ED. Pt brought to the floor in stable condition. Vitals taken. Initial Assessment done. All immediate pertinent needs to patient addressed. Patient Guide given to patient. Important safety instructions relating to hospitalization reviewed with patient. Patient verbalized understanding. Will continue to monitor pt. 

## 2016-10-19 NOTE — ED Notes (Signed)
Patient transported to X-ray 

## 2016-10-20 ENCOUNTER — Other Ambulatory Visit (HOSPITAL_COMMUNITY): Payer: Self-pay | Admitting: Psychiatry

## 2016-10-20 ENCOUNTER — Other Ambulatory Visit: Payer: Self-pay | Admitting: Family Medicine

## 2016-10-20 ENCOUNTER — Other Ambulatory Visit: Payer: Self-pay | Admitting: Cardiology

## 2016-10-20 ENCOUNTER — Encounter (HOSPITAL_COMMUNITY): Payer: Self-pay | Admitting: *Deleted

## 2016-10-20 DIAGNOSIS — I5023 Acute on chronic systolic (congestive) heart failure: Secondary | ICD-10-CM | POA: Diagnosis not present

## 2016-10-20 DIAGNOSIS — F331 Major depressive disorder, recurrent, moderate: Secondary | ICD-10-CM | POA: Diagnosis not present

## 2016-10-20 DIAGNOSIS — E785 Hyperlipidemia, unspecified: Secondary | ICD-10-CM

## 2016-10-20 DIAGNOSIS — I5022 Chronic systolic (congestive) heart failure: Secondary | ICD-10-CM

## 2016-10-20 DIAGNOSIS — I110 Hypertensive heart disease with heart failure: Secondary | ICD-10-CM | POA: Diagnosis not present

## 2016-10-20 LAB — BASIC METABOLIC PANEL
Anion gap: 10 (ref 5–15)
BUN: 21 mg/dL — ABNORMAL HIGH (ref 6–20)
CO2: 32 mmol/L (ref 22–32)
Calcium: 8.5 mg/dL — ABNORMAL LOW (ref 8.9–10.3)
Chloride: 101 mmol/L (ref 101–111)
Creatinine, Ser: 0.87 mg/dL (ref 0.44–1.00)
GFR calc Af Amer: >60 mL/min (ref >60)
GFR calc non Af Amer: >60 mL/min (ref >60)
Glucose, Bld: 164 mg/dL — ABNORMAL HIGH (ref 65–99)
Potassium: 3.1 mmol/L — ABNORMAL LOW (ref 3.5–5.1)
Sodium: 143 mmol/L (ref 135–145)

## 2016-10-20 LAB — HIV ANTIBODY (ROUTINE TESTING W REFLEX): HIV Screen 4th Generation wRfx: NONREACTIVE

## 2016-10-20 MED ORDER — POTASSIUM CHLORIDE CRYS ER 20 MEQ PO TBCR
40.0000 meq | EXTENDED_RELEASE_TABLET | Freq: Once | ORAL | Status: AC
  Administered 2016-10-20: 40 meq via ORAL
  Filled 2016-10-20: qty 2

## 2016-10-20 NOTE — Consult Note (Signed)
Cardiology Consultation:   Patient ID: [name]; [id number]; 19-Dec-1965   Admit date: 10/19/2016 Date of Consult: 10/20/2016  Primary Care Provider: Briscoe Deutscher, DO Primary Cardiologist: Dr Percival Spanish Primary Electrophysiologist:  n/a   Patient Profile:   [name] is a 51 y.o. female with a hx of NICM w/ peripartum EF 30-40%>>50% in 2013>>30-35% 09/2015 echo, ADHD, S-D-CHF, depression, HTN, OSA, obesity, who is being seen today for the evaluation of CHF at the request of Dr Grandville Silos.  History of Present Illness:   [name] admits that she has been stressed by multiple family issues recently. In addition, she had to travel to [place] to see her very ill mother.   In the setting of all this, she got off the eating plan that was helping her lose weight. She started medicating herself with food. She has gained about 30 lbs.  She has not been tracking her weight. She has not been watching what she eats.  She was doing pretty well from a symptom standpoint until 08/21. She got a call that her mother was in the hospital, she felt very stressed. She having cramping pain in her abdomen, not new.  She developed increasing DOE, was getting SOB going back and forth to the bathroom. She describes orthopnea and PND.  Since admission, she has diuresed with 2 doses of IV Lasix. She has lost 7 lbs and is breathing much better. She has some mild LE edema, but gets this during the day.   She was only taking the Lasix 40 mg once a day.    Past Medical History:  Diagnosis Date  . ADD (attention deficit disorder)   . Anxiety   . Back pain   . Cardiomyopathy, peripartum, postpartum   . Carpal tunnel syndrome of right wrist   . CHF (congestive heart failure) ([place])   . Chronic systolic CHF (congestive heart failure), NYHA class 3 ([place]) 12/06/2013  . Depression   . Essential hypertension, benign   . Migraines   . Morbid obesity ([place])   . OSA (obstructive sleep apnea)   . Plantar fasciitis    . Plantar fasciitis   . Sleep apnea   . Spinal stenosis   . Vaso vagal episode     Past Surgical History:  Procedure Laterality Date  . CHOLECYSTECTOMY    . RIGHT/LEFT HEART CATH AND CORONARY ANGIOGRAPHY N/A 04/11/2016   Procedure: Right/Left Heart Cath and Coronary Angiography;  Surgeon: Burnell Blanks, MD;  Location: [facility];  Service: Cardiovascular;  Laterality: N/A;     Inpatient Medications: Scheduled Meds: . amLODipine  10 mg Oral Daily  . busPIRone  10 mg Oral BID  . carvedilol  25 mg Oral BID WC  . enalapril  20 mg Oral BID  . enoxaparin (LOVENOX) injection  40 mg Subcutaneous Q24H  . furosemide  80 mg Intravenous BID  . lamoTRIgine  200 mg Oral Daily  . potassium chloride SA  20 mEq Oral Daily  . psyllium  1 packet Oral Daily  . sertraline  100 mg Oral Daily  . simvastatin  20 mg Oral q1800  . sodium chloride flush  3 mL Intravenous Q12H  . sodium chloride flush  3 mL Intravenous Q12H   Continuous Infusions: . sodium chloride     PRN Meds: sodium chloride, acetaminophen **OR** acetaminophen, clonazePAM, cyclobenzaprine, polyethylene glycol, sodium chloride flush, traMADol Medication Sig  amLODipine (NORVASC) 10 MG tablet Take 1 tablet (10 mg total) by mouth daily.  busPIRone (  BUSPAR) 10 MG tablet Take 1 tablet (10 mg total) by mouth 2 (two) times daily.  carvedilol (COREG) 25 MG tablet Take 1 tablet (25 mg total) by mouth 2 (two) times daily with a meal.  clonazePAM (KLONOPIN) 1 MG tablet Take .5 mg qd prn anxiety  enalapril (VASOTEC) 20 MG tablet Take 1 tablet (20 mg total) by mouth 2 (two) times daily.  furosemide (LASIX) 40 MG tablet Take 1 tablet (40 mg total) by mouth 2 (two) times daily.  ibuprofen (ADVIL,MOTRIN) 800 MG tablet Take 1 tablet (800 mg total) by mouth every 8 (eight) hours as needed for headache or mild pain.  lamoTRIgine (LAMICTAL) 200 MG tablet Take 200 mg by mouth daily.  potassium chloride SA (KLOR-CON M20) 20 MEQ tablet  Take 1 tablet (20 mEq total) by mouth daily.  psyllium (METAMUCIL) 0.52 g capsule Take 1 capsule (0.52 g total) by mouth daily.  sertraline (ZOLOFT) 100 MG tablet 1 tablet po qd Patient taking differently: Take 100 mg by mouth daily.   simvastatin (ZOCOR) 20 MG tablet Take 1 tablet (20 mg total) by mouth daily at 6 PM. Patient taking differently: Take 20 mg by mouth daily.   cyclobenzaprine (FLEXERIL) 10 MG tablet Take 1/2 pill twice a day as needed for muscle spasm  dicyclomine (BENTYL) 20 MG tablet Take 1 tablet (20 mg total) by mouth every 6 (six) hours. Patient not taking: Reported on 10/19/2016    Allergies:    Allergies  Allergen Reactions  . Fluoxetine Other (See Comments)    More depressed  . Cymbalta [Duloxetine Hcl] Other (See Comments)    depressed  . Duloxetine     REACTION: Sucidal thoughts  . Penicillins     REACTION: rash Has patient had a PCN reaction causing immediate rash, facial/tongue/throat swelling, SOB or lightheadedness with hypotension: YES Has patient had a PCN reaction causing severe rash involving mucus membranes or skin necrosis: NO Has patient had a PCN reaction that required hospitalization: YES Has patient had a PCN reaction occurring within the last 10 years: NO If all of the above answers are "NO", then may proceed with Cephalosporin use.   . Prozac [Fluoxetine Hcl] Other (See Comments)    More depressed   Social History:   Social History   Social History  . Marital status: Married    Spouse name: N/A  . Number of children: N/A  . Years of education: N/A   Occupational History  . Not on file.   Social History Main Topics  . Smoking status: Current Some Day Smoker    Packs/day: 0.50    Types: Cigarettes    Last attempt to quit: 02/25/1997  . Smokeless tobacco: Never Used     Comment: Married, lives with spouse (when he is not traveling) and son  . Alcohol use No  . Drug use: No  . Sexual activity: Yes    Partners: Male    Birth  control/ protection: None   Other Topics Concern  . Not on file   Social History Narrative  . No narrative on file    Family History:   The patient's family history includes ADD / ADHD in her father; Alcohol abuse in her other; Anxiety disorder in her mother; Colitis in her other; Depression in her mother; Diabetes in her father, mother, and other; Hypertension in her father, mother, and other. Pt indicated that her mother is alive. She indicated that her father is alive. She indicated that her sister is alive.  She indicated that her maternal grandmother is deceased. She indicated that her maternal grandfather is deceased. She indicated that her paternal grandmother is deceased. She indicated that her paternal grandfather is deceased. She indicated that the status of her other is unknown.    ROS:  Please see the history of present illness.  All other ROS reviewed and negative.      Physical Exam/Data:   Vitals:   10/20/16 0117 10/20/16 0255 10/20/16 0554 10/20/16 1200  BP: 132/84  124/71 (!) 147/62  Pulse: 91 90 (!) 108 87  Resp:  16  18  Temp:   98 F (36.7 C) 97.9 F (36.6 C)  TempSrc:   Oral Oral  SpO2: 94% 94% 92% 94%  Weight:   (!) 323 lb 6.4 oz (146.7 kg)   Height:        Intake/Output Summary (Last 24 hours) at 10/20/16 1638 Last data filed at 10/20/16 1307  Gross per 24 hour  Intake              309 ml  Output             4925 ml  Net            -4616 ml   Filed Weights   10/19/16 0955 10/19/16 1800 10/20/16 0554  Weight: (!) 330 lb (149.7 kg) (!) 327 lb 14.4 oz (148.7 kg) (!) 323 lb 6.4 oz (146.7 kg)   Body mass index is 57.29 kg/m.  General:  Well nourished, well developed, in no acute distress HEENT: normal Lymph: no adenopathy Neck: no JVD seen, difficult to assess 2nd body habitus Endocrine:  No thryomegaly Vascular: No carotid bruits; 4/4 pulses 2+  Cardiac:  normal S1, S2; RRR; no murmur  Lungs:  clear to auscultation bilaterally, no wheezing,  rhonchi or rales  Abd: soft, nontender, no hepatomegaly  Ext: 1+ edema Musculoskeletal:  No deformities, BUE and BLE strength normal and equal Skin: warm and dry  Neuro:  CNs 2-12 intact, no focal abnormalities noted Psych:  Normal affect   EKG:  The EKG was personally reviewed and demonstrates:  SR, HR 87, no acute ischemic changes Telemetry:  Telemetry was personally reviewed and demonstrates:  SR, ST and a 12 bt run of NSVT  Relevant CV Studies: ECHO: 10/26/2015 - Left ventricle: The cavity size was mildly dilated. Wall   thickness was increased in a pattern of mild LVH. Systolic   function was moderately to severely reduced. The estimated   ejection fraction was in the range of 30% to 35%. Diffuse   hypokinesis. Features are consistent with a pseudonormal left   ventricular filling pattern, with concomitant abnormal relaxation   and increased filling pressure (grade 2 diastolic dysfunction). - Aortic valve: There was no stenosis. - Mitral valve: There was no significant regurgitation. - Right ventricle: The cavity size was normal. Systolic function   was normal. - Pulmonary arteries: No complete TR doppler jet so unable to   estimate PA systolic pressure. - Systemic veins: IVC poorly visualized. Impressions: - Mildly dilated LV with mild LV hypertrophy. EF 30-35%, diffuse   hypokinesis. Moderate diastolic dysfunction. Normal RV size and   systolic function. No significant valvular abnormalities.  CARDIAC CATH: 04/11/2016  There is moderate left ventricular systolic dysfunction.  LV end diastolic pressure is moderately elevated.  The left ventricular ejection fraction is 25-35% by visual estimate.  There is no mitral valve regurgitation.  Hemodynamic findings consistent with mild pulmonary hypertension.  1. No  angiographic evidence of CAD 2. Non-ischemic cardiomyopathy with elevated filling pressures.  Recommendations: I will have her increase her Lasix to 40 mg po  BID until she is seen in follow up in 1 week in our office.    Laboratory Data:  Chemistry Recent Labs Lab 10/19/16 1009 10/19/16 1846 10/20/16 0512  NA 142  --  143  K 4.3  --  3.1*  CL 107  --  101  CO2 25  --  32  GLUCOSE 148*  --  164*  BUN 13  --  21*  CREATININE 0.85 0.93 0.87  CALCIUM 8.4*  --  8.5*  GFRNONAA >60 >60 >60  GFRAA >60 >60 >60  ANIONGAP 10  --  10     Recent Labs Lab 10/19/16 1009  PROT 6.5  ALBUMIN 3.2*  AST 31  ALT 31  ALKPHOS 32*  BILITOT 1.2   Hematology Recent Labs Lab 10/19/16 1009 10/19/16 1846  WBC 15.3* 11.8*  RBC 4.65 4.74  HGB 12.6 13.0  HCT 40.0 40.5  MCV 86.0 85.4  MCH 27.1 27.4  MCHC 31.5 32.1  RDW 15.0 15.0  PLT 227 252   Cardiac Enzymes Recent Labs Lab 10/19/16 1009  TROPONINI <0.03     BNP Recent Labs Lab 10/19/16 1009  BNP 296.2*    Radiology/Studies:  Dg Chest 2 View  Result Date: 10/19/2016 CLINICAL DATA:  Shortness of breath. EXAM: CHEST  2 VIEW COMPARISON:  April 03, 2016 FINDINGS: Markedly limited study due to the low volume technique and patient body habitus. Haziness over the right chest is largely due to overlapping soft tissues. Cardiomegaly. The hila and mediastinum are unremarkable. No pneumothorax. No nodules or masses. No focal infiltrates. No overt edema. IMPRESSION: The study is markedly limited due to the low volume technique and patient body habitus. Haziness over the right chest is largely due to overlapping soft tissues. No overt edema. Electronically Signed   By: Dorise Bullion III M.D   On: 10/19/2016 11:36   Ct Angio Chest Pe W/cm &/or Wo Cm  Result Date: 10/19/2016 CLINICAL DATA:  Pt reports shortness of breath with hx of CHF. Pt states swelling throughout EXAM: CT ANGIOGRAPHY CHEST TECHNIQUE: Multidetector CT imaging through the chest was performed using the standard protocol during bolus administration of intravenous contrast. Multiplanar reconstructed images and MIPs were obtained  and reviewed to evaluate the vascular anatomy. CONTRAST:  100 cc Isovue 370 COMPARISON:  04/04/2016 FINDINGS: CTA CHEST FINDINGS Cardiovascular: Right ventricle nondilated. Satisfactory opacification of pulmonary arteries noted, and there is no evidence of pulmonary emboli. Adequate contrast opacification of the thoracic aorta with no evidence of dissection, aneurysm, or stenosis. There is classic 3-vessel brachiocephalic arch anatomy without proximal stenosis. Minimal plaque in the distal aortic arch. Mediastinum/Nodes: No pericardial effusion. No hilar or mediastinal adenopathy. Lungs/Pleura: Small pleural effusions right greater than left. Dependent atelectasis posteriorly in both lower lobes. Scattered geographic areas of mild ground-glass attenuation. Patient motion during the acquisition degrades fine detail. No pneumothorax. Musculoskeletal: Anterior vertebral endplate spurring at multiple levels in the mid and lower thoracic spine. No fracture or worrisome bone lesion. Review of the MIP images confirms the above findings. Upper abdomen:  No acute findings. IMPRESSION: 1. Negative for acute PE or thoracic aortic dissection. 2. Small pleural effusions and geographic mild ground-glass opacities throughout both lungs suggesting mild fluid overload. 3.  Aortic Atherosclerosis (ICD10-170.0) Electronically Signed   By: Lucrezia Europe M.D.   On: 10/19/2016 14:23  Assessment and Plan:   Principal Problem: 1.  CHF (congestive heart failure), NYHA class IV, acute on chronic, systolic (HCC) - agree with diuresis - re-inenforced dietary compliance - she medicates herself with food in times of stress - encouraged her to do daily weights - if she is willing to do daily weights and was given parameters to take extra Lasix prn for 3 lbs in a day or 5 lbs in a week. - MD advise if any further inpatient workup is needed. (her son is to start school tomorrow, it is a special school and she needs to be the one to take  him)  2. Hypokalemia - give an additional 40 meq Kdur today  Otherwise, per IM Active Problems:   Morbid obesity ([place])   Hypertension   Dyslipidemia   Obstructive sleep apnea syndrome   Major depressive disorder   Attention deficit disorder   Smoker   Hyperglycemia    Signed, Lenoard Aden  10/20/2016 4:38 PM   History and all data above reviewed.  Patient examined.  I agree with the findings as above. She is well known to me.  She has nonischemic cardiomyopathy. However, many of her symptoms are related to her poor social situation.   She has been stress eating.  She is not watching her salt.  She doesn't weight herself.  She does report that she takes her meds.  Her dyspnea became more acute over the last 4 or so days.  She feels better now that she has diuresed 4 liters.    The patient exam reveals COR:RRR  ,  Lungs: Clear  ,  Abd: Positive bowel sounds, no rebound no guarding, Ext Mild edema  .  All available labs, radiology testing, previous records reviewed. Agree with documented assessment and plan. Acute on chronic systolic and diastolic HF.  She has done well with IV diuresis.  We discussed (again) salt and fluid and daily weights.  We discussed PRN lasix.  I think she can go home and follow closely with [place].      Jeneen Rinks Drako Maese  6:28 PM  10/20/2016

## 2016-10-20 NOTE — Progress Notes (Signed)
Patient went to bathroom  approx. [id number] and could not get her monitor to come  back on. CCMD allled to inform this writer who was in a chemo room. Informed CCMD that I was working on patient's tele box. Soon Ronalee Belts and this Probation officer changed leads, but still could not get it to turn on. Josph Macho also tried. Prior to leaving, this Probation officer asked Lauren to also tried. Well after few minutes, she got it on.

## 2016-10-20 NOTE — Progress Notes (Signed)
Pt has been given discharge teaching, Tele monitor d/c IV d/c waiting on transportation.

## 2016-10-20 NOTE — Discharge Summary (Signed)
Physician Discharge Summary  [name] ACZ:[id number] DOB: May 07, 1965 DOA: 10/19/2016  PCP: Briscoe Deutscher, DO  Admit date: 10/19/2016 Discharge date: 10/20/2016  Time spent: 60 minutes  Recommendations for Outpatient Follow-up:  1. Follow-up with Dr. Percival [name], cardiology 1-2 weeks. On follow-up patient will need a basic metabolic profile done to follow-up on electrolytes and renal function. Patient's CHF will need to be reassessed.   Discharge Diagnoses:  Principal Problem:   Acute on chronic systolic CHF (congestive heart failure) (HCC) Active Problems:   Morbid obesity ([place])   Hypertension   Dyslipidemia   Obstructive sleep apnea syndrome   Major depressive disorder   Attention deficit disorder   Smoker   Hyperglycemia   Discharge Condition: Stable and improved  Diet recommendation: Heart healthy  Filed Weights   10/19/16 0955 10/19/16 1800 10/20/16 0554  Weight: (!) 149.7 kg (330 lb) (!) 148.7 kg (327 lb 14.4 oz) (!) 146.7 kg (323 lb 6.4 oz)    History of present illness:  Per Dr. Orinda [name] is a 51 y.o. female with medical history significant of pregnancy-induced cardiomyopathy, hypertension, ADHD, and super morbid obesity who presents from home complaining of shortness of breath that began 3 days ago. She complains of orthopnea which is increased in the number of pillows she uses, she has significant dyspnea on exertion as well. She denies any anginal chest pain. She has no cough or fever. She has a lot of stress and anxiety due to a special needs child. EMS was called today to the home and patient was found to be in extremis. She was given albuterol and Solu-Medrol and placed on a CPAP machine. She was transferred here for further evaluation. She states that she has had a 30 pound weight gain but she thinks it's because she went off her diet that's been over 3 weeks. She currently weighs more than 330 pounds. She is compliant with her medicine and recently saw  her primary care physician just yesterday. She wanted to refer her for further testing for elevated blood glucoses. Don't think the shortness of breath was discussed at this meeting. Patient states that she is compliant with a fluid restriction drinking 1-1/2 L of water. Unfortunately she does not count ice cream or other semisolid liquids as a fluid in her fluid count. She does not weigh herself daily. She is extremely worried about getting home for her son. He must be at school on Monday and she is his only transportation. She worries that he may lose his financial aid for the private school he is currently attending.  She was diagnosed with pregnancy-induced cardiomyopathy 15 years ago. She has a great deal stress in her life and states she has difficulty not stress eating. With regards to her cardiomyopathy she had a right and left heart catheterization in February 2018 which showed an ejection fraction of 25-35%. At that time plan was to increase her Lasix to 40 mg twice a day until she was to follow-up in the office. She remains on that dose.  ED Course: Patient was rapidly assessed and found to have COPD versus CHF. Chest x-ray showed small right and left pleural effusions and atelectasis. Her exam was so impressive that a CTA of the chest was obtained and she was found to have small right greater than left pleural effusions, atelectasis and scattered groundglass opacities consistent with fluid volume overload. Given her degree of shortness of breath, oxygen requirement which is new for her and the fact that she required  CPAP on presentation she was referred to me for further evaluation and management.   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 Patient had been under a lot of stress and had some dietary indiscretions. Chest x-ray which was done was limited due to body habitus. CT angiogram of the chest was done which was negative for PE however consistent with pulmonary edema/volume overload. Patient was admitted to telemetry placed on IV Lasix and diuresed during the hospitalization. Patient was -5.766 L. Patient improved clinically. Patient was also maintained on home regimen of Norvasc, Coreg, Vasotec, Zocor. Due to patient's complex cardiac history cardiology was consulted. Patient was also noted to be anxious to leave on the day of admission as his son was starting school on Monday with special needs and she had to be the person to take him to school and was concerned that he will lose his [name] if he was unable to get to school. Cardiology assessed the patient and felt patient was diuresed adequately and cleared patient for discharge. Patient be discharged home on home regimen of Lasix 40 mg twice daily. Patient is to follow-up with cardiology in the outpatient setting in the next 1-2 weeks.  #2 hypertension Patient was maintained on home regimen of Norvasc, Coreg, Vasotec. Outpatient follow-up.  #3 morbid obesity  #4 major depressive disorder/anxiety Patient was maintained on a home regimen of Zoloft and Lamictal and BuSpar. Outpatient follow-up with a psychiatrist.  #5 hyperlipidemia  patient was maintained on a home regimen of statin.  The rest of patient's chronic medical issues remained stable.  #6  Procedures:  Chest x-ray 10/19/2016  CT angiogram chest 10/19/2016  Consultations:  Cardiology: Dr. Percival [name] 10/20/2016  Discharge Exam: Vitals:   10/20/16 0554 10/20/16 1200  BP: 124/71 (!) 147/62  Pulse: (!) 108 87  Resp:  18  Temp: 98 F (36.7 C) 97.9 F (36.6 C)  SpO2: 92% 94%     General: NAD Cardiovascular: RRR Respiratory: CTAB  Discharge Instructions   Discharge Instructions    Amb Referral to HF Clinic    Complete by:  As directed    Diet - low sodium heart healthy    Complete by:  As directed    Increase activity slowly    Complete by:  As directed      Current Discharge Medication List    CONTINUE these medications which have NOT CHANGED   Details  amLODipine (NORVASC) 10 MG tablet Take 1 tablet (10 mg total) by mouth daily. Qty: 90 tablet, Refills: 3    busPIRone (BUSPAR) 10 MG tablet Take 1 tablet (10 mg total) by mouth 2 (two) times daily. Qty: 180 tablet, Refills: 0    carvedilol (COREG) 25 MG tablet Take 1 tablet (25 mg total) by mouth 2 (two) times daily with a meal. Qty: 180 tablet, Refills: 3    clonazePAM (KLONOPIN) 1 MG tablet Take .5 mg qd prn anxiety Qty: 15 tablet, Refills: 0    enalapril (VASOTEC) 20 MG tablet Take 1 tablet (20 mg total) by mouth 2 (two) times daily. Qty: 180 tablet, Refills: 3    furosemide (LASIX) 40 MG tablet  Take 1 tablet (40 mg total) by mouth 2 (two) times daily. Qty: 180 tablet, Refills: 3    ibuprofen (ADVIL,MOTRIN) 800 MG tablet Take 1 tablet (800 mg total) by mouth every 8 (eight) hours as needed for headache or mild pain. Qty: 30 tablet, Refills: 1    lamoTRIgine (LAMICTAL) 200 MG tablet Take 200 mg by mouth daily.    potassium chloride SA (KLOR-CON M20) 20 MEQ tablet Take 1 tablet (20 mEq total) by mouth daily. Qty: 90 tablet, Refills: 3    psyllium (METAMUCIL) 0.52 g capsule Take 1 capsule (0.52 g total) by mouth daily. Qty: 30 capsule, Refills: 0   Associated Diagnoses: Constipation, unspecified constipation type    sertraline (ZOLOFT) 100 MG tablet 1 tablet po qd Qty: 90 tablet, Refills: 0    simvastatin (ZOCOR) 20 MG tablet Take 1 tablet (20 mg total) by mouth daily at 6 PM. Qty: 90 tablet, Refills: 3    cyclobenzaprine (FLEXERIL) 10 MG tablet Take 1/2 pill twice a day as needed  for muscle spasm Qty: 30 tablet, Refills: 0   Associated Diagnoses: Right-sided low back pain with right-sided sciatica    dicyclomine (BENTYL) 20 MG tablet Take 1 tablet (20 mg total) by mouth every 6 (six) hours. Qty: 30 tablet, Refills: 0   Associated Diagnoses: Alternating constipation and diarrhea       Allergies  Allergen Reactions  . Fluoxetine Other (See Comments)    More depressed  . Cymbalta [Duloxetine Hcl] Other (See Comments)    depressed  . Duloxetine     REACTION: Sucidal thoughts  . Penicillins     REACTION: rash Has patient had a PCN reaction causing immediate rash, facial/tongue/throat swelling, SOB or lightheadedness with hypotension: YES Has patient had a PCN reaction causing severe rash involving mucus membranes or skin necrosis: NO Has patient had a PCN reaction that required hospitalization: YES Has patient had a PCN reaction occurring within the last 10 years: NO If all of the above answers are "NO", then may proceed with Cephalosporin use.   . Prozac [Fluoxetine Hcl] Other (See Comments)    More depressed   Follow-up Information    Minus Breeding, MD. Schedule an appointment as soon as possible for a visit in 1 week(s).   Specialty:  Cardiology Why:  f/u in 1-2 weeks. Contact information: [address] [place] [address] [phone]            The results of significant diagnostics from this hospitalization (including imaging, microbiology, ancillary and laboratory) are listed below for reference.    Significant Diagnostic Studies: Dg Chest 2 View  Result Date: 10/19/2016 CLINICAL DATA:  Shortness of breath. EXAM: CHEST  2 VIEW COMPARISON:  April 03, 2016 FINDINGS: Markedly limited study due to the low volume technique and patient body habitus. Haziness over the right chest is largely due to overlapping soft tissues. Cardiomegaly. The hila and mediastinum are unremarkable. No pneumothorax. No nodules or masses. No focal  infiltrates. No overt edema. IMPRESSION: The study is markedly limited due to the low volume technique and patient body habitus. Haziness over the right chest is largely due to overlapping soft tissues. No overt edema. Electronically Signed   By: Dorise Bullion III M.D   On: 10/19/2016 11:36   Ct Angio Chest Pe W/cm &/or Wo Cm  Result Date: 10/19/2016 CLINICAL DATA:  Pt reports shortness of breath with hx of CHF. Pt states swelling throughout EXAM: CT ANGIOGRAPHY CHEST TECHNIQUE: Multidetector CT imaging  through the chest was performed using the standard protocol during bolus administration of intravenous contrast. Multiplanar reconstructed images and MIPs were obtained and reviewed to evaluate the vascular anatomy. CONTRAST:  100 cc Isovue 370 COMPARISON:  04/04/2016 FINDINGS: CTA CHEST FINDINGS Cardiovascular: Right ventricle nondilated. Satisfactory opacification of pulmonary arteries noted, and there is no evidence of pulmonary emboli. Adequate contrast opacification of the thoracic aorta with no evidence of dissection, aneurysm, or stenosis. There is classic 3-vessel brachiocephalic arch anatomy without proximal stenosis. Minimal plaque in the distal aortic arch. Mediastinum/Nodes: No pericardial effusion. No hilar or mediastinal adenopathy. Lungs/Pleura: Small pleural effusions right greater than left. Dependent atelectasis posteriorly in both lower lobes. Scattered geographic areas of mild ground-glass attenuation. Patient motion during the acquisition degrades fine detail. No pneumothorax. Musculoskeletal: Anterior vertebral endplate spurring at multiple levels in the mid and lower thoracic spine. No fracture or worrisome bone lesion. Review of the MIP images confirms the above findings. Upper abdomen:  No acute findings. IMPRESSION: 1. Negative for acute PE or thoracic aortic dissection. 2. Small pleural effusions and geographic mild ground-glass opacities throughout both lungs suggesting mild fluid  overload. 3.  Aortic Atherosclerosis (ICD10-170.0) Electronically Signed   By: Lucrezia Europe M.D.   On: 10/19/2016 14:23    Microbiology: No results found for this or any previous visit (from the past 240 hour(s)).   Labs: Basic Metabolic Panel:  Recent Labs Lab 10/19/16 1009 10/19/16 1846 10/20/16 0512  NA 142  --  143  K 4.3  --  3.1*  CL 107  --  101  CO2 25  --  32  GLUCOSE 148*  --  164*  BUN 13  --  21*  CREATININE 0.85 0.93 0.87  CALCIUM 8.4*  --  8.5*   Liver Function Tests:  Recent Labs Lab 10/19/16 1009  AST 31  ALT 31  ALKPHOS 32*  BILITOT 1.2  PROT 6.5  ALBUMIN 3.2*   No results for input(s): LIPASE, AMYLASE in the last 168 hours. No results for input(s): AMMONIA in the last 168 hours. CBC:  Recent Labs Lab 10/19/16 1009 10/19/16 1846  WBC 15.3* 11.8*  NEUTROABS 13.1*  --   HGB 12.6 13.0  HCT 40.0 40.5  MCV 86.0 85.4  PLT 227 252   Cardiac Enzymes:  Recent Labs Lab 10/19/16 1009  TROPONINI <0.03   BNP: BNP (last 3 results)  Recent Labs  04/03/16 1303 04/04/16 1839 10/19/16 1009  BNP 157.2* 139.5* 296.2*    ProBNP (last 3 results) No results for input(s): PROBNP in the last 8760 hours.  CBG: No results for input(s): GLUCAP in the last 168 hours.     SignedIrine Seal MD.  [facility] 10/20/2016, 7:03 PM

## 2016-10-20 NOTE — Discharge Instructions (Signed)
WEIGH DAILY  If your breathing is ok, your weight tomorrow morning is your dry weight. If you gain 3 lbs in a day or 5 lbs in a week, take Lasix 40 mg bid till your weight gets down.

## 2016-10-20 NOTE — Progress Notes (Signed)
Pt has a concern that nobody has seen her so far in terms of cardiologist and want to be discharged today since her son has a school tomorrow, paged MD regarding her concerns

## 2016-10-21 ENCOUNTER — Telehealth: Payer: Self-pay | Admitting: Cardiology

## 2016-10-21 MED ORDER — CARVEDILOL 25 MG PO TABS: 25.0000 mg | ORAL_TABLET | Freq: Two times a day (BID) | ORAL | 3 refills | Status: DC

## 2016-10-21 MED ORDER — ENALAPRIL MALEATE 20 MG PO TABS: 20.0000 mg | ORAL_TABLET | Freq: Two times a day (BID) | ORAL | 3 refills | Status: DC

## 2016-10-21 MED ORDER — SIMVASTATIN 20 MG PO TABS: 20.0000 mg | ORAL_TABLET | Freq: Every day | ORAL | 3 refills | Status: DC

## 2016-10-21 MED ORDER — COREG 12.5 MG PO TABS: 12.5000 mg | ORAL_TABLET | Freq: Two times a day (BID) | ORAL | 3 refills | Status: DC

## 2016-10-21 MED ORDER — POTASSIUM CHLORIDE CRYS ER 20 MEQ PO TBCR: 20.0000 meq | EXTENDED_RELEASE_TABLET | Freq: Every day | ORAL | 3 refills | Status: DC

## 2016-10-21 MED ORDER — AMLODIPINE BESYLATE 10 MG PO TABS: 10.0000 mg | ORAL_TABLET | Freq: Every day | ORAL | 3 refills | Status: DC

## 2016-10-21 MED ORDER — FUROSEMIDE 40 MG PO TABS: 40.0000 mg | ORAL_TABLET | Freq: Two times a day (BID) | ORAL | 3 refills | Status: DC

## 2016-10-21 NOTE — Telephone Encounter (Signed)
New Message   *STAT* If patient is at the pharmacy, call can be transferred to refill team.   1. Which medications need to be refilled? (please list name of each medication and dose if known) Potassium 20, Amlodipine 10, Simvastatin 20, Enalapril 20, coreg 25  2. Which pharmacy/location (including street and city if local pharmacy) is medication to be sent to? [facility] [address]  3. Do they need a 30 day or 90 day supply? [place]

## 2016-10-21 NOTE — Telephone Encounter (Signed)
REFILL 

## 2016-10-21 NOTE — Telephone Encounter (Signed)
New message      Pt c/o medication issue:  1. Name of Medication: coreg 2. How are you currently taking this medication (dosage and times per day)? 25mg  3. Are you having a reaction (difficulty breathing--STAT)? no  4. What is your medication issue?  Calling to get prior authorization on medication

## 2016-10-21 NOTE — Telephone Encounter (Signed)
New Message   Pt c/o medication issue:  1. Name of Medication:  Coreg  2. How are you currently taking this medication (dosage and times per day)?  25mg   3. Are you having a reaction (difficulty breathing--STAT)? no  4. What is your medication issue?  This medication needs a prior authorization sent in

## 2016-10-21 NOTE — Telephone Encounter (Signed)
Left message for pt PA is complete and approved for one year #[id number].

## 2016-10-21 NOTE — Telephone Encounter (Signed)
[facility] [address] is aware.  Per pt---she takes brand name COREG. Takes 25 mg and 12.5 mg both twice daily.  Also sent in 90 day supply of Furosemide, Amlodipine, Simvastatin, Enalapril, and Potassium.

## 2016-10-21 NOTE — Telephone Encounter (Signed)
Follow up       Just letting you know pt is at the pharmacy. I advised pt the best time to go to the pharmacy to get medication is after pharmacy has called to let them know it is ready.  I told her all I could do was to let nurse know she is at [facility] not guarantee it would be filled while she waited

## 2016-11-22 ENCOUNTER — Encounter: Payer: Self-pay | Admitting: Cardiology

## 2016-12-03 NOTE — Progress Notes (Signed)
HPI The patient presents for evaluation of cardiomyopathy.   She had cath 04/11/16. This revealed normal coronaries, EF 25-30%, mild pulmonary HTN, and elevated LVEDP.  She was in the hospital in August and I saw her at that time.  She had acute on chronic diastolic HF.  She has come back today and she is profoundly depressed. She is not expressing any suicidal ideation but she has tremendous social stress. This interferes with her ability to care for herself. She has been taking her medications. She takes her diuretic twice daily and then as needed. Her weight seemed to be stable. However, she has a little more shortness of breath today. Some mild lower extremity swelling. She doesn't watch salt routinely particularly when her mood is as down as this is. She's not having any chest pressure, neck or arm discomfort. She denies any PND or orthopnea. She's not having any new palpitations, presyncope or syncope.  Allergies  Allergen Reactions  . Fluoxetine Other (See Comments)    More depressed  . Cymbalta [Duloxetine Hcl] Other (See Comments)    depressed  . Duloxetine     REACTION: Sucidal thoughts  . Penicillins     REACTION: rash Has patient had a PCN reaction causing immediate rash, facial/tongue/throat swelling, SOB or lightheadedness with hypotension: YES Has patient had a PCN reaction causing severe rash involving mucus membranes or skin necrosis: NO Has patient had a PCN reaction that required hospitalization: YES Has patient had a PCN reaction occurring within the last 10 years: NO If all of the above answers are "NO", then may proceed with Cephalosporin use.   . Prozac [Fluoxetine Hcl] Other (See Comments)    More depressed    Current Outpatient Prescriptions  Medication Sig Dispense Refill  . amLODipine (NORVASC) 10 MG tablet TAKE 1 TABLET BY MOUTH EVERY DAY 90 tablet 2  . busPIRone (BUSPAR) 10 MG tablet Take 1 tablet (10 mg total) by mouth 2 (two) times daily. 180 tablet 0  .  carvedilol (COREG) 25 MG tablet Take 1 tablet (25 mg total) by mouth 2 (two) times daily with a meal. 180 tablet 3  . clonazePAM (KLONOPIN) 1 MG tablet Take .5 mg qd prn anxiety 15 tablet 0  . COREG 12.5 MG tablet Take 1 tablet (12.5 mg total) by mouth 2 (two) times daily. 180 tablet 3  . cyclobenzaprine (FLEXERIL) 10 MG tablet Take 1/2 pill twice a day as needed for muscle spasm 30 tablet 0  . dicyclomine (BENTYL) 20 MG tablet Take 1 tablet (20 mg total) by mouth every 6 (six) hours. 30 tablet 0  . enalapril (VASOTEC) 20 MG tablet TAKE 1 TABLET (20 MG TOTAL) BY MOUTH 2 (TWO) TIMES DAILY. 180 tablet 2  . ibuprofen (ADVIL,MOTRIN) 800 MG tablet Take 1 tablet (800 mg total) by mouth every 8 (eight) hours as needed for headache or mild pain. 30 tablet 1  . lamoTRIgine (LAMICTAL) 200 MG tablet Take 200 mg by mouth daily.    . potassium chloride SA (KLOR-CON M20) 20 MEQ tablet Take 1 tablet (20 mEq total) by mouth daily. 90 tablet 3  . psyllium (METAMUCIL) 0.52 g capsule Take 1 capsule (0.52 g total) by mouth daily. 30 capsule 0  . sertraline (ZOLOFT) 100 MG tablet 1 tablet po qd (Patient taking differently: Take 100 mg by mouth daily. ) 90 tablet 0  . simvastatin (ZOCOR) 20 MG tablet TAKE 1 TABLET (20 MG TOTAL) BY MOUTH DAILY. 90 tablet 2  .  torsemide (DEMADEX) 20 MG tablet Take 1 tablet (20 mg total) by mouth 2 (two) times daily. As needed 210 tablet 3   No current facility-administered medications for this visit.     Past Medical History:  Diagnosis Date  . ADD (attention deficit disorder)   . Anxiety   . Back pain   . Cardiomyopathy, peripartum, postpartum   . Carpal tunnel syndrome of right wrist   . CHF (congestive heart failure) ([place])   . Chronic systolic CHF (congestive heart failure), NYHA class 3 ([place]) 12/06/2013  . Depression   . Essential hypertension, benign   . Migraines   . Morbid obesity ([place])   . OSA (obstructive sleep apnea)   . Plantar fasciitis   . Plantar fasciitis   .  Sleep apnea   . Spinal stenosis   . Vaso vagal episode     Past Surgical History:  Procedure Laterality Date  . CHOLECYSTECTOMY    . RIGHT/LEFT HEART CATH AND CORONARY ANGIOGRAPHY N/A 04/11/2016   Procedure: Right/Left Heart Cath and Coronary Angiography;  Surgeon: Burnell Blanks, MD;  Location: [place] [facility];  Service: Cardiovascular;  Laterality: N/A;   ROS:     As stated in the HPI and negative for all other systems.  PHYSICAL EXAM BP (!) 146/104   Pulse 95   Ht 5\' 6"  (1.676 m)   Wt (!) 329 lb (149.2 kg)   SpO2 94%   BMI 53.10 kg/m   GENERAL:  Well appearing NECK:  No jugular venous distention, waveform within normal limits, carotid upstroke brisk and symmetric, no bruits, no thyromegaly LUNGS:  Clear to auscultation bilaterally CHEST:  Unremarkable HEART:  PMI not displaced or sustained,S1 and S2 within normal limits, no S3, no S4, no clicks, no rubs, no murmurs ABD:  Flat, positive bowel sounds normal in frequency in pitch, no bruits, no rebound, no guarding, no midline pulsatile mass, no hepatomegaly, no splenomegaly EXT:  2 plus pulses throughout, mild/mod leg bilateral edema, no cyanosis no clubbing PSYCH:  Very tearful    EKG: NA  Lab Results  Component Value Date   TSH 0.804 10/19/2016     ASSESSMENT AND PLAN  DYSPNEA -  She has some increased dyspnea today. I'm to switch her to torsemide 20 mg twice daily and then as needed. She's a basic metabolic profile in 7 days.  She will remain on the other meds.    OBESITY - She has had stable weights.  She is not mentally in a place where she can work on this.   I am very pleased with her weight loss. She will continue with more of the same.  CARDIOMYOPATHY, PERIPARTUM, POSTPARTUM -  EF was as above.  I will continue to manage this with meds as listed.   ESSENTIAL HYPERTENSION, BENIGN -  This is being managed in the context of treating his CHF.  Her BP is not at target.  However, she is on  polypharmacy and her social situation makes this all very difficult.  I will continue to assess and address.   ANXIETY DEPRESSION -  She is not expressing suicidal ideation. However, is very depressed. Her social situation is awful. She might need to be hospitalized but she has no one to take care of her son. Her husband comes and goes and is going for long periods of time and currently is gone. I will talk with her primary provider to see if there might be any social services that could be  prescribed.  APNEA - She has used CPAP. With this therapy.

## 2016-12-04 ENCOUNTER — Ambulatory Visit (INDEPENDENT_AMBULATORY_CARE_PROVIDER_SITE_OTHER): Payer: Medicare Other | Admitting: Cardiology

## 2016-12-04 ENCOUNTER — Encounter: Payer: Self-pay | Admitting: Cardiology

## 2016-12-04 VITALS — BP 146/104 | HR 95 | Ht 66.0 in | Wt 329.0 lb

## 2016-12-04 DIAGNOSIS — I5043 Acute on chronic combined systolic (congestive) and diastolic (congestive) heart failure: Secondary | ICD-10-CM

## 2016-12-04 DIAGNOSIS — Z79899 Other long term (current) drug therapy: Secondary | ICD-10-CM

## 2016-12-04 MED ORDER — TORSEMIDE 20 MG PO TABS: 20.0000 mg | ORAL_TABLET | Freq: Two times a day (BID) | ORAL | 3 refills | Status: DC

## 2016-12-04 NOTE — Patient Instructions (Signed)
Medication Instructions:  STOP Furosemide  START- Torsemide 20 mg twice a day   If you need a refill on your cardiac medications before your next appointment, please call your pharmacy.  Labwork: BMP in 2 week HERE IN OUR OFFICE AT [facility]  Testing/Procedures: None Ordered  Follow-Up: Your physician wants you to follow-up in: 2 Months.    Thank you for choosing [facility] at [facility]!!

## 2016-12-05 ENCOUNTER — Encounter: Payer: Self-pay | Admitting: Cardiology

## 2016-12-21 ENCOUNTER — Other Ambulatory Visit: Payer: Self-pay | Admitting: Family Medicine

## 2016-12-21 DIAGNOSIS — K5900 Constipation, unspecified: Secondary | ICD-10-CM

## 2016-12-23 NOTE — Telephone Encounter (Signed)
Please advise on medication refill. You have never prescribed this.

## 2016-12-26 ENCOUNTER — Other Ambulatory Visit: Payer: Self-pay | Admitting: Emergency Medicine

## 2016-12-26 MED ORDER — IBUPROFEN 800 MG PO TABS: 800.0000 mg | ORAL_TABLET | Freq: Three times a day (TID) | ORAL | 1 refills | Status: DC | PRN

## 2017-01-13 ENCOUNTER — Encounter: Payer: Self-pay | Admitting: Family Medicine

## 2017-01-13 ENCOUNTER — Ambulatory Visit (INDEPENDENT_AMBULATORY_CARE_PROVIDER_SITE_OTHER): Payer: Medicare Other | Admitting: Family Medicine

## 2017-01-13 VITALS — BP 136/78 | HR 80 | Temp 97.9°F | Ht 66.0 in | Wt 332.0 lb

## 2017-01-13 DIAGNOSIS — Z23 Encounter for immunization: Secondary | ICD-10-CM

## 2017-01-13 DIAGNOSIS — G4733 Obstructive sleep apnea (adult) (pediatric): Secondary | ICD-10-CM | POA: Diagnosis not present

## 2017-01-13 DIAGNOSIS — I5022 Chronic systolic (congestive) heart failure: Secondary | ICD-10-CM

## 2017-01-13 DIAGNOSIS — F332 Major depressive disorder, recurrent severe without psychotic features: Secondary | ICD-10-CM

## 2017-01-13 DIAGNOSIS — F900 Attention-deficit hyperactivity disorder, predominantly inattentive type: Secondary | ICD-10-CM

## 2017-01-13 DIAGNOSIS — R7303 Prediabetes: Secondary | ICD-10-CM

## 2017-01-13 LAB — COMPREHENSIVE METABOLIC PANEL
ALT: 24 U/L (ref 0–35)
AST: 21 U/L (ref 0–37)
Albumin: 3.8 g/dL (ref 3.5–5.2)
Alkaline Phosphatase: 32 U/L — ABNORMAL LOW (ref 39–117)
BUN: 18 mg/dL (ref 6–23)
CO2: 33 mEq/L — ABNORMAL HIGH (ref 19–32)
Calcium: 9.5 mg/dL (ref 8.4–10.5)
Chloride: 100 mEq/L (ref 96–112)
Creatinine, Ser: 0.76 mg/dL (ref 0.40–1.20)
GFR: 85.06 mL/min (ref >60.00)
Glucose, Bld: 112 mg/dL — ABNORMAL HIGH (ref 70–99)
Potassium: 3.9 mEq/L (ref 3.5–5.1)
Sodium: 138 mEq/L (ref 135–145)
Total Bilirubin: 0.6 mg/dL (ref 0.2–1.2)
Total Protein: 7 g/dL (ref 6.0–8.3)

## 2017-01-13 NOTE — Progress Notes (Signed)
[name] is a 51 y.o. female is here for follow up.  History of Present Illness:   HPI: See Assessment and Plan section for Problem Based Charting of issues discussed today.  Health Maintenance Due  Topic Date Due  . TETANUS/TDAP  05/28/1984  . MAMMOGRAM  05/29/2015  . PAP SMEAR  08/08/2015   Depression screen [facility] 2/9 10/18/2016 06/26/2016 07/19/2015  Decreased Interest 2 2 1   Down, Depressed, Hopeless 3 2 1   PHQ - 2 Score 5 4 2   Altered sleeping 0 2 1  Tired, decreased energy 0 3 1  Change in appetite 1 3 1   Feeling bad or failure about yourself  1 3 0  Trouble concentrating 0 3 0  Moving slowly or fidgety/restless 0 3 0  Suicidal thoughts 1 0 0  PHQ-9 Score 8 21 5   Difficult doing work/chores Not difficult at all Extremely dIfficult Somewhat difficult  Some recent data might be hidden   PMHx, SurgHx, SocialHx, FamHx, Medications, and Allergies were reviewed in the Visit Navigator and updated as appropriate.   Patient Active Problem List   Diagnosis Date Noted  . Acute on chronic systolic CHF (congestive heart failure) ([place]) 10/19/2016  . Hyperglycemia 10/19/2016  . SOB (shortness of breath) 06/22/2016  . Smoker 06/22/2016  . Normal coronary arteries 04/17/2016  . Obstructive sleep apnea syndrome 10/28/2014  . Major depressive disorder 10/28/2014  . Chronic systolic CHF (congestive heart failure), NYHA class 3 ([place]) 12/06/2013  . Dyslipidemia 04/04/2011  . Hypertension 06/03/2008  . Morbid obesity ([place]) 07/12/2006  . NICM (nonischemic cardiomyopathy) ([place]) 07/12/2006  . Attention deficit disorder 07/12/2006   Social History   Tobacco Use  . Smoking status: Current Some Day Smoker    Packs/day: 0.50    Types: Cigarettes    Last attempt to quit: 02/25/1997    Years since quitting: 19.9  . Smokeless tobacco: Never Used  . Tobacco comment: Married, lives with spouse (when he is not traveling) and son  Substance Use Topics  . Alcohol use: No  . Drug use: No    Current Medications and Allergies:   .  amLODipine (NORVASC) 10 MG tablet, TAKE 1 TABLET BY MOUTH EVERY DAY, Disp: 90 tablet, Rfl: 2 .  busPIRone (BUSPAR) 10 MG tablet, Take 1 tablet (10 mg total) by mouth 2 (two) times daily., Disp: 180 tablet, Rfl: 0 .  carvedilol (COREG) 25 MG tablet, Take 1 tablet (25 mg total) by mouth 2 (two) times daily with a meal., Disp: 180 tablet, Rfl: 3 .  clonazePAM (KLONOPIN) 1 MG tablet, Take .5 mg qd prn anxiety, Disp: 15 tablet, Rfl: 0 .  COREG 12.5 MG tablet, Take 1 tablet (12.5 mg total) by mouth 2 (two) times daily., Disp: 180 tablet, Rfl: 3 .  [facility] DAILY FIBER 0.52 g capsule, TAKE 1 CAPSULE (0.52 G TOTAL) BY MOUTH DAILY., Disp: 100 capsule, Rfl: 0 .  cyclobenzaprine (FLEXERIL) 10 MG tablet, Take 1/2 pill twice a day as needed for muscle spasm, Disp: 30 tablet, Rfl: 0 .  dicyclomine (BENTYL) 20 MG tablet, Take 1 tablet (20 mg total) by mouth every 6 (six) hours., Disp: 30 tablet, Rfl: 0 .  enalapril (VASOTEC) 20 MG tablet, TAKE 1 TABLET (20 MG TOTAL) BY MOUTH 2 (TWO) TIMES DAILY., Disp: 180 tablet, Rfl: 2 .  ibuprofen (ADVIL,MOTRIN) 800 MG tablet, Take 1 tablet (800 mg total) by mouth every 8 (eight) hours as needed for headache or mild pain., Disp: 30 tablet, Rfl: 1 .  lamoTRIgine (LAMICTAL) 200 MG tablet, Take 200 mg by mouth daily., Disp: , Rfl:  .  potassium chloride SA (KLOR-CON M20) 20 MEQ tablet, Take 1 tablet (20 mEq total) by mouth daily., Disp: 90 tablet, Rfl: 3 .  sertraline (ZOLOFT) 100 MG tablet, 1 tablet po qd (Patient taking differently: Take 100 mg by mouth daily. ), Disp: 90 tablet, Rfl: 0 .  simvastatin (ZOCOR) 20 MG tablet, TAKE 1 TABLET (20 MG TOTAL) BY MOUTH DAILY., Disp: 90 tablet, Rfl: 2 .  torsemide (DEMADEX) 20 MG tablet, Take 1 tablet (20 mg total) by mouth 2 (two) times daily. As needed, Disp: 210 tablet, Rfl: 3   Allergies  Allergen Reactions  . Fluoxetine Other (See Comments)    More depressed  . Cymbalta [Duloxetine Hcl]  Other (See Comments)    depressed  . Duloxetine     REACTION: Sucidal thoughts  . Penicillins     REACTION: rash Has patient had a PCN reaction causing immediate rash, facial/tongue/throat swelling, SOB or lightheadedness with hypotension: YES Has patient had a PCN reaction causing severe rash involving mucus membranes or skin necrosis: NO Has patient had a PCN reaction that required hospitalization: YES Has patient had a PCN reaction occurring within the last 10 years: NO If all of the above answers are "NO", then may proceed with Cephalosporin use.   . Prozac [Fluoxetine Hcl] Other (See Comments)    More depressed   Review of Systems   Pertinent items are noted in the HPI. Otherwise, ROS is negative.  Vitals:   Vitals:   01/13/17 1151  BP: 136/78  Pulse: 80  Temp: 97.9 F (36.6 C)  TempSrc: Oral  SpO2: 95%  Weight: (!) 332 lb (150.6 kg)  Height: 5' 6"  (1.676 m)     Body mass index is 53.59 kg/m. Physical Exam:   Physical Exam  Constitutional: She appears well-nourished.  HENT:  Head: Normocephalic and atraumatic.  Eyes: EOM are normal. Pupils are equal, round, and reactive to light.  Neck: Normal range of motion. Neck supple.  Cardiovascular: Normal rate, regular rhythm, normal heart sounds and intact distal pulses.  Pulmonary/Chest: Effort normal.  Abdominal: Soft.  Skin: Skin is warm.  Psychiatric: She has a normal mood and affect. Her behavior is normal.  Nursing note and vitals reviewed.   Results for orders placed or performed in visit on 01/13/17  Comp Met (CMET)  Result Value Ref Range   Sodium 138 135 - 145 mEq/L   Potassium 3.9 3.5 - 5.1 mEq/L   Chloride 100 96 - 112 mEq/L   CO2 33 (H) 19 - 32 mEq/L   Glucose, Bld 112 (H) 70 - 99 mg/dL   BUN 18 6 - 23 mg/dL   Creatinine, Ser 0.76 0.40 - 1.20 mg/dL   Total Bilirubin 0.6 0.2 - 1.2 mg/dL   Alkaline Phosphatase 32 (L) 39 - 117 U/L   AST 21 0 - 37 U/L   ALT 24 0 - 35 U/L   Total Protein 7.0 6.0 -  8.3 g/dL   Albumin 3.8 3.5 - 5.2 g/dL   Calcium 9.5 8.4 - 10.5 mg/dL   GFR 85.06 >60.00 mL/min   Assessment and Plan:   [name] was seen today for follow-up.  Diagnoses and all orders for this visit:  Morbid obesity ([place]) Comments: She is not exercising or watching food choices. See below. The patient is asked to make an attempt to improve diet and exercise patterns to aid in medical management of  this problem. I will discuss starting a GLP-1 at our next visit in one month.   Chronic systolic congestive heart failure [facility]) Comments: Patient saw her Cardiologist last month. She feels that she is becoming fluid overloaded again. Will increase Torsemide for a few days as trial. Discussed the importance of movement and low salt diet. CMP above.  Wt Readings from Last 3 Encounters:  01/13/17 (!) 332 lb (150.6 kg)  12/04/16 (!) 329 lb (149.2 kg)  10/20/16 (!) 323 lb 6.4 oz (146.7 kg)   Orders: -     Comp Met (CMET)  Attention deficit hyperactivity disorder (ADHD), predominantly inattentive type Comments: No stimulants due to CHF.  Prediabetes Comments: A1c is 6.0. Will discuss GLP1 at next visit for preDM, cardiac, and weight benefits.   OSA (obstructive sleep apnea) Comments: Patient not using CPAP. This is essential is treating her other issues. We discussed this at length. Will ask TCM for help.   NOTE: She is sleeping up to 18 hours per day. Can fall asleep on the toilet.   Need for immunization against influenza -     Flu Vaccine QUAD 36+ mos IM  Severe episode of recurrent major depressive disorder, without psychotic features ([place]) Comments: She has a Publishing rights manager that she has not been seeing due to "feeling too bad." We discussed this issues for > 25 minutes. No SI/HI. Much of this is situational, exacerbated by her medical issues, though she has a baseline Dx of bipolar disorder and ADHD. We discussed emergency plans.    . Reviewed expectations re:  course of current medical issues. . Discussed self-management of symptoms. . Outlined signs and symptoms indicating need for more acute intervention. . Patient verbalized understanding and all questions were answered. [name] Health Maintenance issues including appropriate healthy diet, exercise, and smoking avoidance were discussed with patient. . See orders for this visit as documented in the electronic medical record. . Patient received an After Visit Summary.  Briscoe Deutscher, DO [place] [place] 01/19/2017  Future Appointments  Date Time Provider [place]  01/30/2017  9:00 AM Minus Breeding, MD [facility]  02/12/2017  9:00 AM Briscoe Deutscher, DO [facility] None  06/27/2017 10:00 AM Williemae Area, RN [facility] None   Records requested if needed. Time spent with the patient: 60 minutes, of which >50% was spent in obtaining information about her symptoms, reviewing her previous labs, evaluations, and treatments, counseling her about her condition (please see the discussed topics above), and developing a plan to further investigate it; she had a number of questions which I addressed.

## 2017-01-13 NOTE — Patient Instructions (Signed)
I will see if we can help with you CPAP supplies. YOU HAVE TO USE THIS!!!!  You have prediabetes. Follow up in one month to discuss weight loss. The medication that we use for this will help with prediabetes management.   Follow up with your Psychiatrist.   Increase your Torsemide to twice daily for one week. Make sure to take potassium with it.   Check your weight daily.

## 2017-01-19 ENCOUNTER — Encounter: Payer: Self-pay | Admitting: Family Medicine

## 2017-01-29 NOTE — Progress Notes (Signed)
HPI The patient presents for evaluation of cardiomyopathy.   She had cath 04/11/16. This revealed normal coronaries, EF 25-30%, mild pulmonary HTN, and elevated LVEDP.  She was in the hospital in August.  She had acute on chronic diastolic HF.  She came back to the office after this and she is profoundly depressed.   I called her primary MD to follow up.   I switched her to torsemide at that appt.  Since then she is felt somewhat better.  She still has shortness of breath with activity.  Her weights been slowly up and she has been watching her diet.  She has not been active because she is had chronic back pain.  She is actually taking 40 mg of torsemide most days.  She is not describing any chest pressure, neck or arm discomfort.  Not having any new palpitations, presyncope or syncope.  Allergies  Allergen Reactions  . Fluoxetine Other (See Comments)    More depressed  . Cymbalta [Duloxetine Hcl] Other (See Comments)    depressed  . Duloxetine     REACTION: Sucidal thoughts  . Penicillins     REACTION: rash Has patient had a PCN reaction causing immediate rash, facial/tongue/throat swelling, SOB or lightheadedness with hypotension: YES Has patient had a PCN reaction causing severe rash involving mucus membranes or skin necrosis: NO Has patient had a PCN reaction that required hospitalization: YES Has patient had a PCN reaction occurring within the last 10 years: NO If all of the above answers are "NO", then may proceed with Cephalosporin use.   . Prozac [Fluoxetine Hcl] Other (See Comments)    More depressed    Current Outpatient Medications  Medication Sig Dispense Refill  . amLODipine (NORVASC) 10 MG tablet TAKE 1 TABLET BY MOUTH EVERY DAY 90 tablet 2  . busPIRone (BUSPAR) 10 MG tablet Take 1 tablet (10 mg total) by mouth 2 (two) times daily. 180 tablet 0  . carvedilol (COREG) 25 MG tablet Take 1 tablet (25 mg total) by mouth 2 (two) times daily with a meal. 180 tablet 3  .  clonazePAM (KLONOPIN) 1 MG tablet Take .5 mg qd prn anxiety 15 tablet 0  . COREG 12.5 MG tablet Take 1 tablet (12.5 mg total) by mouth 2 (two) times daily. 180 tablet 3  . [facility] DAILY FIBER 0.52 g capsule TAKE 1 CAPSULE (0.52 G TOTAL) BY MOUTH DAILY. 100 capsule 0  . enalapril (VASOTEC) 20 MG tablet TAKE 1 TABLET (20 MG TOTAL) BY MOUTH 2 (TWO) TIMES DAILY. 180 tablet 2  . ibuprofen (ADVIL,MOTRIN) 800 MG tablet Take 1 tablet (800 mg total) by mouth every 8 (eight) hours as needed for headache or mild pain. 30 tablet 1  . lamoTRIgine (LAMICTAL) 200 MG tablet Take 200 mg by mouth daily.    . potassium chloride SA (KLOR-CON M20) 20 MEQ tablet Take 1 tablet (20 mEq total) by mouth daily. 90 tablet 3  . sertraline (ZOLOFT) 100 MG tablet 1 tablet po qd (Patient taking differently: Take 100 mg by mouth daily. ) 90 tablet 0  . simvastatin (ZOCOR) 20 MG tablet TAKE 1 TABLET (20 MG TOTAL) BY MOUTH DAILY. 90 tablet 2  . torsemide (DEMADEX) 20 MG tablet Take 40 mg in the morning and 20 mg 6 hours later    . cyclobenzaprine (FLEXERIL) 10 MG tablet Take 1/2 pill twice a day as needed for muscle spasm 30 tablet 0  . dicyclomine (BENTYL) 20 MG tablet Take 1  tablet (20 mg total) by mouth every 6 (six) hours. 30 tablet 0  . spironolactone (ALDACTONE) 25 MG tablet Take 1 tablet (25 mg total) by mouth daily. 90 tablet 3   No current facility-administered medications for this visit.     Past Medical History:  Diagnosis Date  . ADD (attention deficit disorder)   . Anxiety   . Back pain   . Cardiomyopathy, peripartum, postpartum   . Carpal tunnel syndrome of right wrist   . CHF (congestive heart failure) ([place])   . Chronic systolic CHF (congestive heart failure), NYHA class 3 ([place]) 12/06/2013  . Depression   . Essential hypertension, benign   . Migraines   . Morbid obesity ([place])   . OSA (obstructive sleep apnea)   . Plantar fasciitis   . Plantar fasciitis   . Sleep apnea   . Spinal stenosis   . Vaso vagal  episode     Past Surgical History:  Procedure Laterality Date  . CHOLECYSTECTOMY    . RIGHT/LEFT HEART CATH AND CORONARY ANGIOGRAPHY N/A 04/11/2016   Procedure: Right/Left Heart Cath and Coronary Angiography;  Surgeon: Burnell Blanks, MD;  Location: [facility];  Service: Cardiovascular;  Laterality: N/A;   ROS:    As stated in the HPI and negative for all other systems.  PHYSICAL EXAM BP 118/81   Pulse 76   Ht 5\' 4"  (1.626 m)   Wt (!) 331 lb (150.1 kg)   BMI 56.82 kg/m   GENERAL:  Well appearing NECK:  No jugular venous distention, waveform within normal limits, carotid upstroke brisk and symmetric, no bruits, no thyromegaly LUNGS:  Clear to auscultation bilaterally CHEST:  Unremarkable HEART:  PMI not displaced or sustained,S1 and S2 within normal limits, no S3, no S4, no clicks, no rubs, no murmurs ABD:  Flat, positive bowel sounds normal in frequency in pitch, no bruits, no rebound, no guarding, no midline pulsatile mass, no hepatomegaly, no splenomegaly EXT:  2 plus pulses throughout, moderate edema, no cyanosis no clubbing    EKG: NA   Lab Results  Component Value Date   TSH 0.804 10/19/2016     ASSESSMENT AND PLAN  DYSPNEA -  Today I am going to increase her torsemide to 40 mg in the morning and 20 mg 6 hours later.  I am also going to add Spiriva lactone 25 mg as below.  OBESITY - She is going to be referred to [facility].   She needs increased exercise.    CARDIOMYOPATHY, PERIPARTUM, POSTPARTUM -  EF was as above.  I will follow guidelines for spironolactone follow up in heart failure.  (Check potassium levels and  renal function 3--4 days and 1 week, then at least monthly for first 3 months and every 3 months thereafter after initiation of spironolactone.)    ESSENTIAL HYPERTENSION, BENIGN -  This is being managed in the context of treating his CHF.       ANXIETY DEPRESSION - She saw Briscoe Deutscher, DO and is also encouraged to follow up  with her psychologist and psychiatrist.    APNEA - She was not using CPAP.  However, she wants to start using this and needs a Sleep Clinic Appt.

## 2017-01-30 ENCOUNTER — Ambulatory Visit (INDEPENDENT_AMBULATORY_CARE_PROVIDER_SITE_OTHER): Payer: Medicare Other | Admitting: Cardiology

## 2017-01-30 ENCOUNTER — Encounter: Payer: Self-pay | Admitting: Cardiology

## 2017-01-30 VITALS — BP 118/81 | HR 76 | Ht 64.0 in | Wt 331.0 lb

## 2017-01-30 DIAGNOSIS — Z79899 Other long term (current) drug therapy: Secondary | ICD-10-CM

## 2017-01-30 DIAGNOSIS — I5023 Acute on chronic systolic (congestive) heart failure: Secondary | ICD-10-CM | POA: Diagnosis not present

## 2017-01-30 DIAGNOSIS — G4730 Sleep apnea, unspecified: Secondary | ICD-10-CM

## 2017-01-30 DIAGNOSIS — I10 Essential (primary) hypertension: Secondary | ICD-10-CM | POA: Diagnosis not present

## 2017-01-30 MED ORDER — SPIRONOLACTONE 25 MG PO TABS: 25.0000 mg | ORAL_TABLET | Freq: Every day | ORAL | 3 refills | Status: DC

## 2017-01-30 NOTE — Patient Instructions (Addendum)
Medication Instructions:  START- Spironolactone 25 mg daily INCREASE- Torsemide 40 mg in the morning and 20 mg 6 hours later  If you need a refill on your cardiac medications before your next appointment, please call your pharmacy.  Labwork: BMP 3-4 days, BMP in 1 week, BMP in 1 Month. HERE IN OUR OFFICE AT [facility]  Take the provided lab slips for you to take with you to the lab for you blood draw.   You will NOT need to fast   You may go to any [facility] lab that is convenient for you however, we do have a lab in our office that is able to assist you. You do NOT need an appointment for our lab. Once in our office lobby there is a podium to the right of the check-in desk where you are to sign-in and ring a doorbell to alert [place] you are here. Lab is open Monday-Friday from 8:00am to 4:00pm; and is closed for lunch from 12:45p-1:45pm   Testing/Procedures: None Ordered   Follow-Up: Your physician wants you to follow-up in: Sleep Consult with Dr Claiborne Billings.  Your physician recommends that you schedule a follow-up appointment in: 6 Months with Dr Percival Spanish.    Thank you for choosing [facility] at [facility]!!

## 2017-02-04 ENCOUNTER — Ambulatory Visit (HOSPITAL_COMMUNITY): Payer: Self-pay | Admitting: Psychiatry

## 2017-02-06 ENCOUNTER — Other Ambulatory Visit: Payer: Self-pay | Admitting: *Deleted

## 2017-02-09 ENCOUNTER — Other Ambulatory Visit (HOSPITAL_COMMUNITY): Payer: Self-pay | Admitting: Psychiatry

## 2017-02-11 ENCOUNTER — Encounter (HOSPITAL_COMMUNITY): Payer: Self-pay | Admitting: Psychiatry

## 2017-02-11 ENCOUNTER — Ambulatory Visit (INDEPENDENT_AMBULATORY_CARE_PROVIDER_SITE_OTHER): Payer: Medicare Other | Admitting: Psychiatry

## 2017-02-11 VITALS — BP 136/82 | HR 99 | Ht 63.0 in | Wt 330.4 lb

## 2017-02-11 DIAGNOSIS — Z87891 Personal history of nicotine dependence: Secondary | ICD-10-CM

## 2017-02-11 DIAGNOSIS — Z818 Family history of other mental and behavioral disorders: Secondary | ICD-10-CM

## 2017-02-11 DIAGNOSIS — F331 Major depressive disorder, recurrent, moderate: Secondary | ICD-10-CM

## 2017-02-11 DIAGNOSIS — Z811 Family history of alcohol abuse and dependence: Secondary | ICD-10-CM

## 2017-02-11 MED ORDER — BUSPIRONE HCL 10 MG PO TABS: 10.0000 mg | ORAL_TABLET | Freq: Two times a day (BID) | ORAL | 0 refills | Status: DC

## 2017-02-11 MED ORDER — SERTRALINE HCL 100 MG PO TABS: ORAL_TABLET | ORAL | 0 refills | Status: DC

## 2017-02-11 MED ORDER — CLONAZEPAM 1 MG PO TABS: ORAL_TABLET | ORAL | 1 refills | Status: DC

## 2017-02-11 MED ORDER — LAMOTRIGINE 200 MG PO TABS: 200.0000 mg | ORAL_TABLET | Freq: Every day | ORAL | 1 refills | Status: DC

## 2017-02-11 NOTE — Progress Notes (Signed)
BH MD/PA/NP OP Progress Note  02/11/2017 3:24 PM [name]  MRN:  [id number]  Chief Complaint: returns for medication management- history of depression and anxiety  HPI: Patient had not returned for appointment in several months.  She states she has been having increased symptoms related to her history of Cardiac Disease ( has history of HTN, Cardiomyopathy) and has been seeing her cardiologist regularly, who is in the process of adjusting her medications. States she is now feeling " a little better", but still easily fatigued, with exertional dyspnea, and concerned that she has gained about 20lbs over recent weeks to months. Reports some increased depression, anxiety related to the above. She presents with full range, bright affect, and denies any suicidal ideations. Denies anhedonia. Chronic family stressors ( husband had history of travelling abroad without her knowledge, son has history of Autism Spectrum Disorder) have improved. Son is doing well in school, his grades have improved, relationship with husband improved . Denies medication side effects.  Visit Diagnosis: MDD Past Psychiatric History: history of depression  Past Medical History:  Past Medical History:  Diagnosis Date  . ADD (attention deficit disorder)   . Anxiety   . Back pain   . Cardiomyopathy, peripartum, postpartum   . Carpal tunnel syndrome of right wrist   . CHF (congestive heart failure) ([place])   . Chronic systolic CHF (congestive heart failure), NYHA class 3 ([place]) 12/06/2013  . Depression   . Essential hypertension, benign   . Migraines   . Morbid obesity ([place])   . OSA (obstructive sleep apnea)   . Plantar fasciitis   . Plantar fasciitis   . Sleep apnea   . Spinal stenosis   . Vaso vagal episode     Past Surgical History:  Procedure Laterality Date  . CHOLECYSTECTOMY    . RIGHT/LEFT HEART CATH AND CORONARY ANGIOGRAPHY N/A 04/11/2016   Procedure: Right/Left Heart Cath and Coronary Angiography;   Surgeon: Burnell Blanks, MD;  Location: [facility];  Service: Cardiovascular;  Laterality: N/A;    Family Psychiatric History: non contributory   Family History:  Family History  Problem Relation Age of Onset  . Depression Mother   . Anxiety disorder Mother   . Diabetes Mother   . Hypertension Mother   . ADD / ADHD Father   . Diabetes Father   . Hypertension Father   . Hypertension Other   . Diabetes Other   . Colitis Other   . Alcohol abuse Other     Social History:  Social History   Socioeconomic History  . Marital status: Married    Spouse name: None  . Number of children: None  . Years of education: None  . Highest education level: None  Social Needs  . Financial resource strain: None  . Food insecurity - worry: None  . Food insecurity - inability: None  . Transportation needs - medical: None  . Transportation needs - non-medical: None  Occupational History  . None  Tobacco Use  . Smoking status: Former Smoker    Packs/day: 0.50    Types: Cigarettes    Last attempt to quit: 02/25/1997    Years since quitting: 19.9  . Smokeless tobacco: Never Used  . Tobacco comment: Married, lives with spouse (when he is not traveling) and son  Substance and Sexual Activity  . Alcohol use: No  . Drug use: No  . Sexual activity: Yes    Partners: Male    Birth control/protection: None  Other  Topics Concern  . None  Social History Narrative  . None    Allergies:  Allergies  Allergen Reactions  . Fluoxetine Other (See Comments)    More depressed  . Cymbalta [Duloxetine Hcl] Other (See Comments)    depressed  . Duloxetine     REACTION: Sucidal thoughts  . Penicillins     REACTION: rash Has patient had a PCN reaction causing immediate rash, facial/tongue/throat swelling, SOB or lightheadedness with hypotension: YES Has patient had a PCN reaction causing severe rash involving mucus membranes or skin necrosis: NO Has patient had a PCN reaction that  required hospitalization: YES Has patient had a PCN reaction occurring within the last 10 years: NO If all of the above answers are "NO", then may proceed with Cephalosporin use.   . Prozac [Fluoxetine Hcl] Other (See Comments)    More depressed    Metabolic Disorder Labs: Lab Results  Component Value Date   HGBA1C 6.0 (H) 10/19/2016   HGBA1C 6.0 (H) 10/19/2016   MPG 125.5 10/19/2016   MPG 125.5 10/19/2016   No results found for: PROLACTIN Lab Results  Component Value Date   CHOL 204 (H) 07/18/2016   TRIG 123.0 07/18/2016   HDL 51.10 07/18/2016   CHOLHDL 4 07/18/2016   VLDL 24.6 07/18/2016   LDLCALC 128 (H) 07/18/2016   LDLCALC 78 11/08/2013   Lab Results  Component Value Date   TSH 0.804 10/19/2016   TSH 5.45 (H) 04/08/2016    Therapeutic Level Labs: No results found for: LITHIUM No results found for: VALPROATE No components found for:  CBMZ  Current Medications: Current Outpatient Medications  Medication Sig Dispense Refill  . amLODipine (NORVASC) 10 MG tablet TAKE 1 TABLET BY MOUTH EVERY DAY 90 tablet 2  . busPIRone (BUSPAR) 10 MG tablet Take 1 tablet (10 mg total) by mouth 2 (two) times daily. 180 tablet 0  . carvedilol (COREG) 25 MG tablet Take 1 tablet (25 mg total) by mouth 2 (two) times daily with a meal. 180 tablet 3  . clonazePAM (KLONOPIN) 1 MG tablet Take .5 mg qd prn anxiety 30 tablet 1  . COREG 12.5 MG tablet Take 1 tablet (12.5 mg total) by mouth 2 (two) times daily. 180 tablet 3  . [facility] DAILY FIBER 0.52 g capsule TAKE 1 CAPSULE (0.52 G TOTAL) BY MOUTH DAILY. 100 capsule 0  . cyclobenzaprine (FLEXERIL) 10 MG tablet Take 1/2 pill twice a day as needed for muscle spasm 30 tablet 0  . dicyclomine (BENTYL) 20 MG tablet Take 1 tablet (20 mg total) by mouth every 6 (six) hours. 30 tablet 0  . enalapril (VASOTEC) 20 MG tablet TAKE 1 TABLET (20 MG TOTAL) BY MOUTH 2 (TWO) TIMES DAILY. 180 tablet 2  . ibuprofen (ADVIL,MOTRIN) 800 MG tablet Take 1 tablet (800 mg  total) by mouth every 8 (eight) hours as needed for headache or mild pain. 30 tablet 1  . lamoTRIgine (LAMICTAL) 200 MG tablet Take 1 tablet (200 mg total) by mouth daily. 30 tablet 1  . potassium chloride SA (KLOR-CON M20) 20 MEQ tablet Take 1 tablet (20 mEq total) by mouth daily. 90 tablet 3  . sertraline (ZOLOFT) 100 MG tablet Take one and half tablets PO QDAY 90 tablet 0  . simvastatin (ZOCOR) 20 MG tablet TAKE 1 TABLET (20 MG TOTAL) BY MOUTH DAILY. 90 tablet 2  . spironolactone (ALDACTONE) 25 MG tablet Take 1 tablet (25 mg total) by mouth daily. 90 tablet 3  . torsemide (DEMADEX)  20 MG tablet Take 40 mg in the morning and 20 mg 6 hours later     No current facility-administered medications for this visit.      Musculoskeletal: Strength & Muscle Tone: within normal limits Gait & Station: normal Patient leans: Backward  Psychiatric Specialty Exam: ROS reports exertional dyspnea, denies chest pain at present , no vomiting , but reports intermittent nausea  Blood pressure 136/82, pulse 99, height 5\' 3"  (1.6 m), weight (!) 149.9 kg (330 lb 6.4 oz).Body mass index is 58.53 kg/m.  General Appearance: Well Groomed  Eye Contact:  Good  Speech:  Normal Rate  Volume:  Normal  Mood:  reports some increased depression  Affect:  Appropriate and Full Range  Thought Process:  Linear and Descriptions of Associations: Intact  Orientation:  Full (Time, Place, and Person)  Thought Content: no hallucinations, no delusions, not internally preoccupied    Suicidal Thoughts:  No denies suicidal or self injurious ideations, denies homicidal or violent ideations  Homicidal Thoughts:  No  Memory:  recent and remote grossly intact   Judgement:  Other:  good   Insight:  Present  Psychomotor Activity:  Decreased  Concentration:  Concentration: Good and Attention Span: Good  Recall:  Good  Fund of Knowledge: Good  Language: Good  Akathisia:  Negative  Handed:  Right  AIMS (if indicated):   Assets:   Desire for Improvement Resilience  ADL's:  Intact  Cognition: WNL  Sleep:  Good   Screenings: AUDIT     Admission (Discharged) from 12/05/2013 in [facility]  Alcohol Use Disorder Identification Test Final Score (AUDIT)  0    PHQ2-9     Office Visit from 10/18/2016 in [facility] from 06/26/2016 in [facility] from 07/19/2015 in [facility] Visit from 07/05/2015 in [facility] at [facility]  PHQ-2 Total Score  5  4  2   0  PHQ-9 Total Score  8  21  5   No data       Assessment and Plan: patient reports some increased depression in the context of medical illness ( history of cardiomyopathy,), as she has had increased symptoms over recent weeks- reports dyspnea o exertion, peripheral edema. At this time breathing comfortably at room air . She is seeing her cardiologist regularly and reports medications are being adjusted . She presents with bright and full range of affect, denies any SI. History of long term management, good response and no side effects to Zoloft /Lamictal .  Increase Zoloft from 100 mgrs QDAY to 150 mgrs QDAY . No other medication changes at this time . Will see patient in one month, agrees to contact me sooner if any worsening prior .   Jenne Campus, MD 02/11/2017, 3:24 PM

## 2017-02-12 ENCOUNTER — Ambulatory Visit (INDEPENDENT_AMBULATORY_CARE_PROVIDER_SITE_OTHER): Payer: Medicare Other | Admitting: Family Medicine

## 2017-02-12 ENCOUNTER — Encounter: Payer: Self-pay | Admitting: Family Medicine

## 2017-02-12 DIAGNOSIS — G4733 Obstructive sleep apnea (adult) (pediatric): Secondary | ICD-10-CM | POA: Diagnosis not present

## 2017-02-12 DIAGNOSIS — I5022 Chronic systolic (congestive) heart failure: Secondary | ICD-10-CM

## 2017-02-12 DIAGNOSIS — G8929 Other chronic pain: Secondary | ICD-10-CM | POA: Diagnosis not present

## 2017-02-12 DIAGNOSIS — R7303 Prediabetes: Secondary | ICD-10-CM | POA: Diagnosis not present

## 2017-02-12 DIAGNOSIS — M545 Low back pain: Secondary | ICD-10-CM

## 2017-02-12 DIAGNOSIS — F17200 Nicotine dependence, unspecified, uncomplicated: Secondary | ICD-10-CM

## 2017-02-12 LAB — BRAIN NATRIURETIC PEPTIDE: Pro B Natriuretic peptide (BNP): 44 pg/mL (ref 0.0–100.0)

## 2017-02-12 LAB — COMPREHENSIVE METABOLIC PANEL
ALT: 18 U/L (ref 0–35)
AST: 19 U/L (ref 0–37)
Albumin: 4.1 g/dL (ref 3.5–5.2)
Alkaline Phosphatase: 44 U/L (ref 39–117)
BUN: 24 mg/dL — ABNORMAL HIGH (ref 6–23)
CO2: 31 mEq/L (ref 19–32)
Calcium: 9.3 mg/dL (ref 8.4–10.5)
Chloride: 97 mEq/L (ref 96–112)
Creatinine, Ser: 1.03 mg/dL (ref 0.40–1.20)
GFR: 59.87 mL/min — ABNORMAL LOW (ref >60.00)
Glucose, Bld: 106 mg/dL — ABNORMAL HIGH (ref 70–99)
Potassium: 4.5 mEq/L (ref 3.5–5.1)
Sodium: 139 mEq/L (ref 135–145)
Total Bilirubin: 0.6 mg/dL (ref 0.2–1.2)
Total Protein: 7.6 g/dL (ref 6.0–8.3)

## 2017-02-12 LAB — POCT GLYCOSYLATED HEMOGLOBIN (HGB A1C): Hemoglobin A1C: 6

## 2017-02-12 MED ORDER — GABAPENTIN 100 MG PO CAPS
200.0000 mg | ORAL_CAPSULE | Freq: Three times a day (TID) | ORAL | 3 refills | Status: DC
Start: 2017-02-12 — End: 2017-03-24

## 2017-02-12 NOTE — Progress Notes (Addendum)
[name] is a 51 y.o. female is here for follow up.  History of Present Illness:   HPI: See Assessment and Plan section for Problem Based Charting of issues discussed today.  Health Maintenance Due  Topic Date Due  . TETANUS/TDAP  05/28/1984  . MAMMOGRAM  05/29/2015  . PAP SMEAR  08/08/2015   Depression screen [facility] 2/9 10/18/2016 06/26/2016 07/19/2015  Decreased Interest 2 2 1   Down, Depressed, Hopeless 3 2 1   PHQ - 2 Score 5 4 2   Altered sleeping 0 2 1  Tired, decreased energy 0 3 1  Change in appetite 1 3 1   Feeling bad or failure about yourself  1 3 0  Trouble concentrating 0 3 0  Moving slowly or fidgety/restless 0 3 0  Suicidal thoughts 1 0 0  PHQ-9 Score 8 21 5   Difficult doing work/chores Not difficult at all Extremely dIfficult Somewhat difficult  Some recent data might be hidden   PMHx, SurgHx, SocialHx, FamHx, Medications, and Allergies were reviewed in the Visit Navigator and updated as appropriate.   Patient Active Problem List   Diagnosis Date Noted  . Acute on chronic systolic CHF (congestive heart failure) ([place]) 10/19/2016  . Hyperglycemia 10/19/2016  . SOB (shortness of breath) 06/22/2016  . Smoker 06/22/2016  . Normal coronary arteries 04/17/2016  . Obstructive sleep apnea syndrome 10/28/2014  . Major depressive disorder 10/28/2014  . Chronic systolic CHF (congestive heart failure), NYHA class 3 ([place]) 12/06/2013  . Dyslipidemia 04/04/2011  . Hypertension 06/03/2008  . Morbid obesity ([place]) 07/12/2006  . NICM (nonischemic cardiomyopathy) ([place]) 07/12/2006  . Attention deficit disorder 07/12/2006   Social History   Tobacco Use  . Smoking status: Former Smoker    Packs/day: 0.50    Types: Cigarettes    Last attempt to quit: 02/25/1997    Years since quitting: 19.9  . Smokeless tobacco: Never Used  . Tobacco comment: Married, lives with spouse (when he is not traveling) and son  Substance Use Topics  . Alcohol use: No  . Drug use: No   Current  Medications and Allergies:   .  amLODipine (NORVASC) 10 MG tablet, TAKE 1 TABLET BY MOUTH EVERY DAY, Disp: 90 tablet, Rfl: 2 .  busPIRone (BUSPAR) 10 MG tablet, Take 1 tablet (10 mg total) by mouth 2 (two) times daily., Disp: 180 tablet, Rfl: 0 .  carvedilol (COREG) 25 MG tablet, Take 1 tablet (25 mg total) by mouth 2 (two) times daily with a meal., Disp: 180 tablet, Rfl: 3 .  clonazePAM (KLONOPIN) 1 MG tablet, Take .5 mg qd prn anxiety, Disp: 30 tablet, Rfl: 1 .  COREG 12.5 MG tablet, Take 1 tablet (12.5 mg total) by mouth 2 (two) times daily., Disp: 180 tablet, Rfl: 3 .  [facility] DAILY FIBER 0.52 g capsule, TAKE 1 CAPSULE (0.52 G TOTAL) BY MOUTH DAILY., Disp: 100 capsule, Rfl: 0 .  cyclobenzaprine (FLEXERIL) 10 MG tablet, Take 1/2 pill twice a day as needed for muscle spasm, Disp: 30 tablet, Rfl: 0 .  dicyclomine (BENTYL) 20 MG tablet, Take 1 tablet (20 mg total) by mouth every 6 (six) hours., Disp: 30 tablet, Rfl: 0 .  enalapril (VASOTEC) 20 MG tablet, TAKE 1 TABLET (20 MG TOTAL) BY MOUTH 2 (TWO) TIMES DAILY., Disp: 180 tablet, Rfl: 2 .  ibuprofen (ADVIL,MOTRIN) 800 MG tablet, Take 1 tablet (800 mg total) by mouth every 8 (eight) hours as needed for headache or mild pain., Disp: 30 tablet, Rfl: 1 .  lamoTRIgine (LAMICTAL) 200 MG tablet, Take 1 tablet (200 mg total) by mouth daily., Disp: 30 tablet, Rfl: 1 .  potassium chloride SA (KLOR-CON M20) 20 MEQ tablet, Take 1 tablet (20 mEq total) by mouth daily., Disp: 90 tablet, Rfl: 3 .  sertraline (ZOLOFT) 100 MG tablet, Take one and half tablets PO QDAY, Disp: 90 tablet, Rfl: 0 .  simvastatin (ZOCOR) 20 MG tablet, TAKE 1 TABLET (20 MG TOTAL) BY MOUTH DAILY., Disp: 90 tablet, Rfl: 2 .  spironolactone (ALDACTONE) 25 MG tablet, Take 1 tablet (25 mg total) by mouth daily., Disp: 90 tablet, Rfl: 3 .  torsemide (DEMADEX) 20 MG tablet, Take 40 mg in the morning and 20 mg 6 hours later, Disp: , Rfl:    Allergies  Allergen Reactions  . Fluoxetine Other (See  Comments)    More depressed  . Cymbalta [Duloxetine Hcl] Other (See Comments)    depressed  . Duloxetine     REACTION: Sucidal thoughts  . Penicillins     REACTION: rash Has patient had a PCN reaction causing immediate rash, facial/tongue/throat swelling, SOB or lightheadedness with hypotension: YES Has patient had a PCN reaction causing severe rash involving mucus membranes or skin necrosis: NO Has patient had a PCN reaction that required hospitalization: YES Has patient had a PCN reaction occurring within the last 10 years: NO If all of the above answers are "NO", then may proceed with Cephalosporin use.   . Prozac [Fluoxetine Hcl] Other (See Comments)    More depressed   Review of Systems   Pertinent items are noted in the HPI. Otherwise, ROS is negative.  Vitals:   Vitals:   02/12/17 0904  BP: 110/62  Pulse: 80  Temp: (!) 97.1 F (36.2 C)  TempSrc: Oral  SpO2: 94%  Weight: (!) 330 lb 6.4 oz (149.9 kg)     Body mass index is 58.53 kg/m.   Physical Exam:   Physical Exam  Constitutional: She appears well-nourished.  HENT:  Head: Normocephalic and atraumatic.  Eyes: EOM are normal. Pupils are equal, round, and reactive to light.  Neck: Normal range of motion. Neck supple.  Cardiovascular: Normal rate, regular rhythm, normal heart sounds and intact distal pulses.  Pulmonary/Chest: Effort normal.  Abdominal: Soft.  Skin: Skin is warm.  Psychiatric: Her behavior is normal. Her affect is labile. Her speech is tangential.  Nursing note and vitals reviewed.   Results for orders placed or performed in visit on 01/13/17  Comp Met (CMET)  Result Value Ref Range   Sodium 138 135 - 145 mEq/L   Potassium 3.9 3.5 - 5.1 mEq/L   Chloride 100 96 - 112 mEq/L   CO2 33 (H) 19 - 32 mEq/L   Glucose, Bld 112 (H) 70 - 99 mg/dL   BUN 18 6 - 23 mg/dL   Creatinine, Ser 0.76 0.40 - 1.20 mg/dL   Total Bilirubin 0.6 0.2 - 1.2 mg/dL   Alkaline Phosphatase 32 (L) 39 - 117 U/L   AST 21  0 - 37 U/L   ALT 24 0 - 35 U/L   Total Protein 7.0 6.0 - 8.3 g/dL   Albumin 3.8 3.5 - 5.2 g/dL   Calcium 9.5 8.4 - 10.5 mg/dL   GFR 85.06 >60.00 mL/min   Lab Results  Component Value Date   HGBA1C 6.0 (H) 10/19/2016   HGBA1C 6.0 (H) 10/19/2016   Assessment and Plan:   Talani was seen today for follow-up.  Diagnoses and all orders for this visit:  Morbid obesity ([place]) Comments: Patient's weight is stable today.  Today, she states multiple times that she is worried about fluid overload. BNP WNL. Taking diuretic. We reviewed the option of Bariatric Surgery. Will discuss further weight loss option at our next visit.   Obstructive sleep apnea syndrome Comments: Patient continues to be noncompliant with CPAP.  We discussed the importance of obtaining the CPAP.  Prediabetes Comments: Current symptoms: Edema, SOB. Maintaining a diabetic diet? []   YES  [x]   NO Trying to exercise on a regular basis? []   YES  [x]   NO  On ACE inhibitor or angiotensin II receptor blocker? [x]   YES  []   NO  Lab Results  Component Value Date   HGBA1C 6.0 02/12/2017    No results found for: Derl Barrow  Lab Results  Component Value Date   CHOL 204 (H) 07/18/2016   HDL 51.10 07/18/2016   LDLCALC 128 (H) 07/18/2016   LDLDIRECT 143.8 04/03/2011   TRIG 123.0 07/18/2016   CHOLHDL 4 07/18/2016     Wt Readings from Last 3 Encounters:  02/12/17 (!) 330 lb 6.4 oz (149.9 kg)  01/30/17 (!) 331 lb (150.1 kg)  01/13/17 (!) 332 lb (150.6 kg)   BP Readings from Last 3 Encounters:  02/12/17 110/62  01/30/17 118/81  01/13/17 136/78   Lab Results  Component Value Date   CREATININE 1.03 67/20/9470   Chronic systolic CHF (congestive heart failure), NYHA class 3 ([facility]) Comments: Followed by cardiology.  She stopped torsemide and spironolactone x 3 days ago because she is "tired of peeing." She has not felt a change in edema. BNP 44 today. She states that she became dizzy - with mild nausea. Had a  few low BP numbers - 102/67, 95/64 with pulse 69. [facility] and asked for [facility]ist for guidance. She did call any of her doctors. "I was dying."  Smoker Comments: I advised patient to quit smoking, and offered support.  QUITLINE: 1-800-QUIT-NOW (731)103-4619).  Back Pain Wearing a back brace bought on TV infomercial. Not helping.   . Reviewed expectations re: course of current medical issues. . Discussed self-management of symptoms. . Outlined signs and symptoms indicating need for more acute intervention. . Patient verbalized understanding and all questions were answered. [name] Health Maintenance issues including appropriate healthy diet, exercise, and smoking avoidance were discussed with patient. . See orders for this visit as documented in the electronic medical record. . Patient received an After Visit Summary.  Briscoe Deutscher, DO [place], [place] 02/12/2017  Future Appointments  Date Time Provider [place]  03/18/2017  1:00 PM Cobos, Myer Peer, MD [facility] None  03/26/2017 11:00 AM Troy Sine, MD [facility]  06/27/2017 10:00 AM Williemae Area, RN [facility]   Orders Placed This Encounter  Procedures  . Comprehensive metabolic panel  . Brain natriuretic peptide  . POCT glycosylated hemoglobin (Hb A1C)   Meds ordered this encounter  Medications  . gabapentin (NEURONTIN) 100 MG capsule    Sig: Take 2 capsules (200 mg total) by mouth 3 (three) times daily.    Dispense:  90 capsule    Refill:  3   Records requested if needed. Time spent with the patient: 30 minutes, of which >50% was spent in obtaining information about her symptoms, reviewing her previous labs, evaluations, and treatments, counseling her about her condition (please see the discussed topics above), and developing a plan to further investigate it; she had a number of questions which I addressed.

## 2017-02-21 ENCOUNTER — Ambulatory Visit: Payer: Medicare Other | Admitting: Family Medicine

## 2017-03-05 ENCOUNTER — Other Ambulatory Visit: Payer: Self-pay | Admitting: *Deleted

## 2017-03-05 NOTE — Telephone Encounter (Signed)
Called to talk with [name] to asked if she had started the Spironolactone given to her in December, and if so had she had the blood work done, pt husband stated she is asleep at the moment and will call back after she wake up.

## 2017-03-11 ENCOUNTER — Encounter: Payer: Self-pay | Admitting: Family Medicine

## 2017-03-11 ENCOUNTER — Ambulatory Visit (INDEPENDENT_AMBULATORY_CARE_PROVIDER_SITE_OTHER): Payer: Medicare Other | Admitting: Family Medicine

## 2017-03-11 ENCOUNTER — Ambulatory Visit (INDEPENDENT_AMBULATORY_CARE_PROVIDER_SITE_OTHER): Payer: Medicare Other

## 2017-03-11 ENCOUNTER — Telehealth: Payer: Self-pay | Admitting: Family Medicine

## 2017-03-11 VITALS — BP 136/68 | HR 86

## 2017-03-11 DIAGNOSIS — R300 Dysuria: Secondary | ICD-10-CM | POA: Diagnosis not present

## 2017-03-11 DIAGNOSIS — M545 Low back pain: Secondary | ICD-10-CM | POA: Diagnosis not present

## 2017-03-11 DIAGNOSIS — M48061 Spinal stenosis, lumbar region without neurogenic claudication: Secondary | ICD-10-CM | POA: Diagnosis not present

## 2017-03-11 LAB — COMPREHENSIVE METABOLIC PANEL
ALT: 16 U/L (ref 0–35)
AST: 16 U/L (ref 0–37)
Albumin: 3.8 g/dL (ref 3.5–5.2)
Alkaline Phosphatase: 41 U/L (ref 39–117)
BUN: 20 mg/dL (ref 6–23)
CO2: 32 mEq/L (ref 19–32)
Calcium: 8.7 mg/dL (ref 8.4–10.5)
Chloride: 101 mEq/L (ref 96–112)
Creatinine, Ser: 0.85 mg/dL (ref 0.40–1.20)
GFR: 74.71 mL/min (ref >60.00)
Glucose, Bld: 124 mg/dL — ABNORMAL HIGH (ref 70–99)
Potassium: 4 mEq/L (ref 3.5–5.1)
Sodium: 141 mEq/L (ref 135–145)
Total Bilirubin: 0.5 mg/dL (ref 0.2–1.2)
Total Protein: 7.3 g/dL (ref 6.0–8.3)

## 2017-03-11 LAB — POCT URINALYSIS DIPSTICK
Bilirubin, UA: NEGATIVE
Blood, UA: NEGATIVE
Glucose, UA: NEGATIVE
Ketones, UA: NEGATIVE
Nitrite, UA: NEGATIVE
Protein, UA: NEGATIVE
Spec Grav, UA: 1.02 (ref 1.010–1.025)
Urobilinogen, UA: 0.2 E.U./dL
pH, UA: 5 (ref 5.0–8.0)

## 2017-03-11 MED ORDER — HYDROCODONE-ACETAMINOPHEN 5-325 MG PO TABS: 1.0000 | ORAL_TABLET | Freq: Four times a day (QID) | ORAL | 0 refills | Status: DC | PRN

## 2017-03-11 NOTE — Telephone Encounter (Signed)
Please advise 

## 2017-03-11 NOTE — Telephone Encounter (Signed)
Patient evaluated today

## 2017-03-11 NOTE — Telephone Encounter (Signed)
Patient came into the office to make an appointment for a follow up and Ellie was speaking with her. Ellie stated that the patient was hunched over the counter and complaining of back pains. Ellie offered to make her an appointment today however, the patient declined and stated that she did not want to go to the ER.  I came over and encouraged the patient to see the triage nurse today. The patient then stated she felt dizzy and nauseous. I got her a wheelchair and went to get the triage nurse [name] to speak with the patient.

## 2017-03-11 NOTE — Telephone Encounter (Signed)
The patient would like to know if she needs to take her potassium. She states that her cardiologist changed her medications, and it caused her potassium to make her sick. The patient states she will make an appointment with her cardiologist, however would like to know what to do about her potassium in the meanwhile.   The patient also would like to know if she should make an appointment with a spine and scoliosis doctor, due to her back issues.  The patient also states she thinks she has alzheimer's.   Please advise the patient on these issues.

## 2017-03-11 NOTE — Telephone Encounter (Signed)
We are seeing patient.

## 2017-03-12 LAB — URINE CULTURE
MICRO NUMBER:: 90060468
Result:: NO GROWTH
SPECIMEN QUALITY:: ADEQUATE

## 2017-03-14 ENCOUNTER — Telehealth: Payer: Self-pay

## 2017-03-14 NOTE — Telephone Encounter (Signed)
Called patient and moved appointment. To 38min for extended time.

## 2017-03-16 NOTE — Progress Notes (Signed)
[name] is a 52 y.o. female here for an acute visit.  History of Present Illness:   HPI:   PMHx, SurgHx, SocialHx, Medications, and Allergies were reviewed in the Visit Navigator and updated as appropriate.  Current Medications:   Current Outpatient Medications:  .  amLODipine (NORVASC) 10 MG tablet, TAKE 1 TABLET BY MOUTH EVERY DAY, Disp: 90 tablet, Rfl: 2 .  busPIRone (BUSPAR) 10 MG tablet, Take 1 tablet (10 mg total) by mouth 2 (two) times daily., Disp: 180 tablet, Rfl: 0 .  carvedilol (COREG) 25 MG tablet, Take 1 tablet (25 mg total) by mouth 2 (two) times daily with a meal., Disp: 180 tablet, Rfl: 3 .  clonazePAM (KLONOPIN) 1 MG tablet, Take .5 mg qd prn anxiety, Disp: 30 tablet, Rfl: 1 .  COREG 12.5 MG tablet, Take 1 tablet (12.5 mg total) by mouth 2 (two) times daily., Disp: 180 tablet, Rfl: 3 .  [facility] DAILY FIBER 0.52 g capsule, TAKE 1 CAPSULE (0.52 G TOTAL) BY MOUTH DAILY., Disp: 100 capsule, Rfl: 0 .  dicyclomine (BENTYL) 20 MG tablet, Take 1 tablet (20 mg total) by mouth every 6 (six) hours., Disp: 30 tablet, Rfl: 0 .  enalapril (VASOTEC) 20 MG tablet, TAKE 1 TABLET (20 MG TOTAL) BY MOUTH 2 (TWO) TIMES DAILY., Disp: 180 tablet, Rfl: 2 .  gabapentin (NEURONTIN) 100 MG capsule, Take 2 capsules (200 mg total) by mouth 3 (three) times daily., Disp: 90 capsule, Rfl: 3 .  ibuprofen (ADVIL,MOTRIN) 800 MG tablet, Take 1 tablet (800 mg total) by mouth every 8 (eight) hours as needed for headache or mild pain., Disp: 30 tablet, Rfl: 1 .  lamoTRIgine (LAMICTAL) 200 MG tablet, Take 1 tablet (200 mg total) by mouth daily., Disp: 30 tablet, Rfl: 1 .  potassium chloride SA (KLOR-CON M20) 20 MEQ tablet, Take 1 tablet (20 mEq total) by mouth daily., Disp: 90 tablet, Rfl: 3 .  sertraline (ZOLOFT) 100 MG tablet, Take one and half tablets PO QDAY, Disp: 90 tablet, Rfl: 0 .  simvastatin (ZOCOR) 20 MG tablet, TAKE 1 TABLET (20 MG TOTAL) BY MOUTH DAILY., Disp: 90 tablet, Rfl: 2 .  torsemide  (DEMADEX) 20 MG tablet, Take 40 mg in the morning and 20 mg 6 hours later, Disp: , Rfl:    Allergies  Allergen Reactions  . Fluoxetine Other (See Comments)    More depressed  . Cymbalta [Duloxetine Hcl] Other (See Comments)    depressed  . Duloxetine     REACTION: Sucidal thoughts  . Penicillins     REACTION: rash Has patient had a PCN reaction causing immediate rash, facial/tongue/throat swelling, SOB or lightheadedness with hypotension: YES Has patient had a PCN reaction causing severe rash involving mucus membranes or skin necrosis: NO Has patient had a PCN reaction that required hospitalization: YES Has patient had a PCN reaction occurring within the last 10 years: NO If all of the above answers are "NO", then may proceed with Cephalosporin use.   . Prozac [Fluoxetine Hcl] Other (See Comments)    More depressed   Review of Systems:   Pertinent items are noted in the HPI. Otherwise, ROS is negative.  Vitals:   Vitals:   03/11/17 1029  BP: 136/68  Pulse: 86  SpO2: 96%     There is no height or weight on file to calculate BMI.  Physical Exam:   Physical Exam  Constitutional: She appears well-nourished.  HENT:  Head: Normocephalic and atraumatic.  Eyes: EOM are normal.  Pupils are equal, round, and reactive to light.  Neck: Normal range of motion. Neck supple.  Cardiovascular: Normal rate, regular rhythm, normal heart sounds and intact distal pulses.  Pulmonary/Chest: Effort normal.  Abdominal: Soft.  Musculoskeletal:  In wheelchair.  Skin: Skin is warm.  Psychiatric: She has a normal mood and affect. Her behavior is normal.  Nursing note and vitals reviewed.   Results for orders placed or performed in visit on 03/11/17  Urine Culture  Result Value Ref Range   MICRO NUMBER: [id number]    SPECIMEN QUALITY: ADEQUATE    Sample Source NOT GIVEN    STATUS: FINAL    Result: No Growth   Comprehensive metabolic panel  Result Value Ref Range   Sodium 141 135 - 145  mEq/L   Potassium 4.0 3.5 - 5.1 mEq/L   Chloride 101 96 - 112 mEq/L   CO2 32 19 - 32 mEq/L   Glucose, Bld 124 (H) 70 - 99 mg/dL   BUN 20 6 - 23 mg/dL   Creatinine, Ser 0.85 0.40 - 1.20 mg/dL   Total Bilirubin 0.5 0.2 - 1.2 mg/dL   Alkaline Phosphatase 41 39 - 117 U/L   AST 16 0 - 37 U/L   ALT 16 0 - 35 U/L   Total Protein 7.3 6.0 - 8.3 g/dL   Albumin 3.8 3.5 - 5.2 g/dL   Calcium 8.7 8.4 - 10.5 mg/dL   GFR 74.71 >60.00 mL/min  POCT urinalysis dipstick  Result Value Ref Range   Color, UA Yellow    Clarity, UA Cloudy    Glucose, UA Negative    Bilirubin, UA Negative    Ketones, UA Negative    Spec Grav, UA 1.020 1.010 - 1.025   Blood, UA Negative    pH, UA 5.0 5.0 - 8.0   Protein, UA Negative    Urobilinogen, UA 0.2 0.2 or 1.0 E.U./dL   Nitrite, UA Negative    Leukocytes, UA Small (1+) (A) Negative   Appearance     Odor     EXAM: LUMBAR SPINE - 2-3 VIEW  COMPARISON:  Supine abdominal radiograph of Jul 18, 2016  FINDINGS: The lumbar vertebral bodies are preserved in height. There is chronic levocurvature centered at L2 which is part of an S shaped thoracolumbar scoliosis which was better demonstrated on the previous study. There is disc space narrowing at L1-2, L3-4, and L4-5. There is no spondylolisthesis. There is mild facet joint hypertrophy from L3 through S1. The pedicles and transverse processes are intact where visualized.  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 No compression fracture or spondylolisthesis.  Assessment and Plan:   1. Acute low back pain, unspecified back pain laterality, with sciatica presence unspecified  Low Back Pain Patient complains of acute on chronic low back pain. The patient first noted symptoms several years ago. It was related to no known injury. The pain is rated severe, and is located at the across the lower back. The pain is described as aching, sharp, soreness and stiffness  and occurs all day. The symptoms have been progressive. Symptoms are exacerbated by extension. Factors which relieve the pain include nothing. Other associated symptoms include burning pain in the right leg and burning pain in the left leg. Previous history of symptoms: the problem is long-standing. Treatment efforts have included rest and OTC NSAIDS, without relief.  Will recommend evaluation by PMR to see if injections could help.   - POCT urinalysis dipstick -  DG Lumbar Spine 2-3 Views; Future - HYDROcodone-acetaminophen (NORCO/VICODIN) 5-325 MG tablet; Take 1 tablet by mouth every 6 (six) hours as needed for moderate pain.  Dispense: 20 tablet; Refill: 0 - Comprehensive metabolic panel - Urine Culture  2. Dysuria - Urine Culture   . Reviewed expectations re: course of current medical issues. . Discussed self-management of symptoms. . Outlined signs and symptoms indicating need for more acute intervention. . Patient verbalized understanding and all questions were answered. [name] Health Maintenance issues including appropriate healthy diet, exercise, and smoking avoidance were discussed with patient. . See orders for this visit as documented in the electronic medical record. . Patient received an After Visit Summary.   Briscoe Deutscher, DO [place], [facility] 03/16/2017

## 2017-03-17 ENCOUNTER — Ambulatory Visit: Payer: Medicare Other | Admitting: Family Medicine

## 2017-03-18 ENCOUNTER — Ambulatory Visit: Payer: Medicare Other | Admitting: Family Medicine

## 2017-03-18 ENCOUNTER — Ambulatory Visit (HOSPITAL_COMMUNITY): Payer: Medicare Other | Admitting: Psychiatry

## 2017-03-21 ENCOUNTER — Encounter: Payer: Self-pay | Admitting: Family Medicine

## 2017-03-24 ENCOUNTER — Other Ambulatory Visit: Payer: Self-pay

## 2017-03-24 ENCOUNTER — Telehealth (HOSPITAL_COMMUNITY): Payer: Self-pay

## 2017-03-24 ENCOUNTER — Ambulatory Visit: Payer: Medicare Other | Admitting: Family Medicine

## 2017-03-24 MED ORDER — GABAPENTIN 100 MG PO CAPS: 200.0000 mg | ORAL_CAPSULE | Freq: Three times a day (TID) | ORAL | 1 refills | Status: DC

## 2017-03-24 NOTE — Telephone Encounter (Signed)
I spoke to the patient about her appointment and she states she is unable to come in at this time. She would like to know when is the next time you will have available. Please advise, thank you

## 2017-03-25 ENCOUNTER — Ambulatory Visit (HOSPITAL_COMMUNITY): Payer: Self-pay | Admitting: Psychiatry

## 2017-03-26 ENCOUNTER — Ambulatory Visit: Payer: Self-pay | Admitting: Cardiovascular Disease

## 2017-04-19 ENCOUNTER — Telehealth (HOSPITAL_COMMUNITY): Payer: Self-pay

## 2017-04-19 DIAGNOSIS — F331 Major depressive disorder, recurrent, moderate: Secondary | ICD-10-CM

## 2017-04-19 NOTE — Telephone Encounter (Signed)
OK to renew medication 1 month, no refills  Please also ask her to reschedule an appointment to see me Thanks Dr. Parke Poisson

## 2017-04-19 NOTE — Telephone Encounter (Signed)
Medication refill request - Fax from [facility] requesting a refill of patient's prescribed Lamictal. Patient last seen 02/11/17, cancelled appt. 03/25/17 and has not called back to reschedule.

## 2017-04-21 MED ORDER — LAMOTRIGINE 200 MG PO TABS: 200.0000 mg | ORAL_TABLET | Freq: Every day | ORAL | 0 refills | Status: DC

## 2017-04-21 NOTE — Telephone Encounter (Signed)
A new one month order for patient's Lamictal e-scribed to [facility]'s [facility] on [address] this date per Dr. Parke Poisson authorization and with instruction, evaluation required for further refills.

## 2017-04-28 ENCOUNTER — Other Ambulatory Visit: Payer: Self-pay | Admitting: *Deleted

## 2017-05-24 ENCOUNTER — Other Ambulatory Visit (HOSPITAL_COMMUNITY): Payer: Self-pay | Admitting: Psychiatry

## 2017-05-24 DIAGNOSIS — F331 Major depressive disorder, recurrent, moderate: Secondary | ICD-10-CM

## 2017-06-05 ENCOUNTER — Telehealth: Payer: Self-pay | Admitting: Family Medicine

## 2017-06-05 NOTE — Telephone Encounter (Signed)
Copied from [place] [phone]. Topic: Quick Communication - See Telephone Encounter >> Jun 05, 2017 11:02 AM Boyd Kerbs wrote: CRM for notification.   Pt. Is saying she is in [place] (had to go to see her mom, she is not doing well) saying she is in a lot of pain with her back and Sciatica.   She does not know what to do.  She is having a lot of anxiety due to her pain and her mom.  Asking if Dr. Juleen China or nurse would call her on what she can do   See Telephone encounter for: 06/05/17.

## 2017-06-05 NOTE — Telephone Encounter (Signed)
Patient states she is going through a lot now- she is a lot of pain. Patient is in [place] to stay with her mother ( in nursing home with COPD- hospice has been called in)- she is very stressed- she is having a lot of pain in her right leg and shoulder. She is taking hydrocodone 5-325 mg and Cyclobenzaprine 5 mg. Patient has lower back pain and she is using stairs/walking. She is using the medication at night because she can't drive on them. Patient states she is having spasms on the R. Patient wants to know if she should come back home for appointment? Her spinal doctor does not have appointment until 4/25. Contact # [phone]. Patient wants to know if there is anything that Dr Juleen China can do for her.

## 2017-06-05 NOTE — Telephone Encounter (Signed)
Do you want me to have her come in or tell her to go to urgent care near her?

## 2017-06-06 NOTE — Telephone Encounter (Signed)
She should go to urgent care.

## 2017-06-06 NOTE — Telephone Encounter (Signed)
Spoke with patient and she stated that she is going to be coming home next week. She is wanting to see Dr. Juleen China when she gets back in town. I told her to wait until she gets in town to call and schedule. I advised that if she starts to feel worse to go to an urgent care. She stated that she does not want to see an urgent care at this time. Her husband is coming to pick her up to bring her home. She again stated that she would rather wait until she got back home to be seen due to her doctors knowing her.

## 2017-06-18 ENCOUNTER — Other Ambulatory Visit: Payer: Self-pay | Admitting: Family Medicine

## 2017-06-27 ENCOUNTER — Ambulatory Visit: Payer: Self-pay | Admitting: *Deleted

## 2017-07-02 ENCOUNTER — Ambulatory Visit: Payer: Medicare Other | Admitting: Family Medicine

## 2017-07-03 ENCOUNTER — Telehealth (HOSPITAL_COMMUNITY): Payer: Self-pay

## 2017-07-03 NOTE — Telephone Encounter (Signed)
Patient is calling, she is out of state and has been helping her mother - her mom passed away yesterday. Patient was very tearful and is requesting a call from you as soon as possible. She said she is at her dads and does not get great reception, but she would like you to try. Her number is [phone], thank you

## 2017-07-10 ENCOUNTER — Ambulatory Visit: Payer: Self-pay | Admitting: *Deleted

## 2017-07-14 ENCOUNTER — Ambulatory Visit (INDEPENDENT_AMBULATORY_CARE_PROVIDER_SITE_OTHER): Payer: Medicare Other

## 2017-07-14 ENCOUNTER — Ambulatory Visit: Payer: Self-pay

## 2017-07-14 ENCOUNTER — Telehealth: Payer: Self-pay | Admitting: Cardiology

## 2017-07-14 ENCOUNTER — Ambulatory Visit (INDEPENDENT_AMBULATORY_CARE_PROVIDER_SITE_OTHER): Payer: Medicare Other | Admitting: Family Medicine

## 2017-07-14 ENCOUNTER — Encounter: Payer: Self-pay | Admitting: Family Medicine

## 2017-07-14 VITALS — BP 126/68 | HR 82 | Temp 97.6°F | Ht 64.0 in | Wt 330.8 lb

## 2017-07-14 DIAGNOSIS — R0602 Shortness of breath: Secondary | ICD-10-CM

## 2017-07-14 DIAGNOSIS — R635 Abnormal weight gain: Secondary | ICD-10-CM

## 2017-07-14 DIAGNOSIS — R1084 Generalized abdominal pain: Secondary | ICD-10-CM

## 2017-07-14 MED ORDER — POTASSIUM CHLORIDE CRYS ER 20 MEQ PO TBCR: 20.0000 meq | EXTENDED_RELEASE_TABLET | Freq: Two times a day (BID) | ORAL | 3 refills | Status: DC

## 2017-07-14 NOTE — Progress Notes (Signed)
[name] is a 52 y.o. female is here for follow up.  History of Present Illness:   [name], CMA acting as scribe for Dr. Briscoe Deutscher.   Patient just coming back from [place] where she was taking care of mother until she passed. She was their for over two months.   [name] is a 52 y.o. female who presents for evaluation of shortness of breath. Dyspnea has been moderate and severe, and generally lasting hours. Episodes usually occur all day and have been gradually worsening. Associated symptoms include: leg swelling, light-headed/dizzy and palpitations. The symptoms are aggravated by anxiety, exertion, recumbency and stress, and relieved by nothing. She called he Cardiologist and was told to double Lasix, which she has done for a few days.  Patient also complains of generalized abdominal fullness, bloating, and some RUQ discomfort. No bowel changes.  Health Maintenance Due  Topic Date Due  . TETANUS/TDAP  05/28/1984  . MAMMOGRAM  05/29/2015  . PAP SMEAR  08/08/2015   Depression screen [facility] 2/9 10/18/2016 06/26/2016 07/19/2015  Decreased Interest 2 2 1   Down, Depressed, Hopeless 3 2 1   PHQ - 2 Score 5 4 2   Altered sleeping 0 2 1  Tired, decreased energy 0 3 1  Change in appetite 1 3 1   Feeling bad or failure about yourself  1 3 0  Trouble concentrating 0 3 0  Moving slowly or fidgety/restless 0 3 0  Suicidal thoughts 1 0 0  PHQ-9 Score 8 21 5   Difficult doing work/chores Not difficult at all Extremely dIfficult Somewhat difficult  Some recent data might be hidden   PMHx, SurgHx, SocialHx, FamHx, Medications, and Allergies were reviewed in the Visit Navigator and updated as appropriate.   Patient Active Problem List   Diagnosis Date Noted  . Acute on chronic systolic CHF (congestive heart failure) ([name]) 10/19/2016  . Hyperglycemia 10/19/2016  . SOB (shortness of breath) 06/22/2016  . Smoker 06/22/2016  . Normal coronary arteries 04/17/2016  . Obstructive sleep  apnea syndrome 10/28/2014  . Major depressive disorder 10/28/2014  . Chronic systolic CHF (congestive heart failure), NYHA class 3 ([place]) 12/06/2013  . Dyslipidemia 04/04/2011  . Hypertension 06/03/2008  . Morbid obesity ([place]) 07/12/2006  . NICM (nonischemic cardiomyopathy) ([place]) 07/12/2006  . Attention deficit disorder 07/12/2006   Social History   Tobacco Use  . Smoking status: Former Smoker    Packs/day: 0.50    Types: Cigarettes    Last attempt to quit: 02/25/1997    Years since quitting: 20.3  . Smokeless tobacco: Never Used  . Tobacco comment: Married, lives with spouse (when he is not traveling) and son  Substance Use Topics  . Alcohol use: No  . Drug use: No   Current Medications and Allergies:    Current Outpatient Medications:  .  amLODipine (NORVASC) 10 MG tablet, TAKE 1 TABLET BY MOUTH EVERY DAY, Disp: 90 tablet, Rfl: 2 .  busPIRone (BUSPAR) 10 MG tablet, Take 1 tablet (10 mg total) by mouth 2 (two) times daily., Disp: 180 tablet, Rfl: 0 .  carvedilol (COREG) 25 MG tablet, Take 1 tablet (25 mg total) by mouth 2 (two) times daily with a meal., Disp: 180 tablet, Rfl: 3 .  clonazePAM (KLONOPIN) 1 MG tablet, Take .5 mg qd prn anxiety, Disp: 30 tablet, Rfl: 1 .  COREG 12.5 MG tablet, Take 1 tablet (12.5 mg total) by mouth 2 (two) times daily., Disp: 180 tablet, Rfl: 3 .  [facility] DAILY FIBER 0.52 g  capsule, TAKE 1 CAPSULE (0.52 G TOTAL) BY MOUTH DAILY., Disp: 100 capsule, Rfl: 0 .  dicyclomine (BENTYL) 20 MG tablet, Take 1 tablet (20 mg total) by mouth every 6 (six) hours., Disp: 30 tablet, Rfl: 0 .  enalapril (VASOTEC) 20 MG tablet, TAKE 1 TABLET (20 MG TOTAL) BY MOUTH 2 (TWO) TIMES DAILY., Disp: 180 tablet, Rfl: 2 .  gabapentin (NEURONTIN) 100 MG capsule, TAKE 2 CAPSULES (200 MG TOTAL) BY MOUTH 3 (THREE) TIMES DAILY., Disp: 270 capsule, Rfl: 0 .  HYDROcodone-acetaminophen (NORCO/VICODIN) 5-325 MG tablet, Take 1 tablet by mouth every 6 (six) hours as needed for moderate pain.,  Disp: 20 tablet, Rfl: 0 .  ibuprofen (ADVIL,MOTRIN) 800 MG tablet, Take 1 tablet (800 mg total) by mouth every 8 (eight) hours as needed for headache or mild pain., Disp: 30 tablet, Rfl: 1 .  lamoTRIgine (LAMICTAL) 200 MG tablet, Take 1 tablet (200 mg total) by mouth daily., Disp: 30 tablet, Rfl: 0 .  potassium chloride SA (KLOR-CON M20) 20 MEQ tablet, Take 1 tablet (20 mEq total) by mouth 2 (two) times daily., Disp: 180 tablet, Rfl: 3 .  sertraline (ZOLOFT) 100 MG tablet, Take one and half tablets PO QDAY, Disp: 90 tablet, Rfl: 0 .  simvastatin (ZOCOR) 20 MG tablet, TAKE 1 TABLET (20 MG TOTAL) BY MOUTH DAILY., Disp: 90 tablet, Rfl: 2    Allergies  Allergen Reactions  . Fluoxetine Other (See Comments)    More depressed  . Cymbalta [Duloxetine Hcl] Other (See Comments)    depressed  . Duloxetine     REACTION: Sucidal thoughts  . Penicillins     REACTION: rash Has patient had a PCN reaction causing immediate rash, facial/tongue/throat swelling, SOB or lightheadedness with hypotension: YES Has patient had a PCN reaction causing severe rash involving mucus membranes or skin necrosis: NO Has patient had a PCN reaction that required hospitalization: YES Has patient had a PCN reaction occurring within the last 10 years: NO If all of the above answers are "NO", then may proceed with Cephalosporin use.   . Prozac [Fluoxetine Hcl] Other (See Comments)    More depressed   Review of Systems   Pertinent items are noted in the HPI. Otherwise, ROS is negative.  Vitals:   Vitals:   07/14/17 1422  BP: 126/68  Pulse: 82  Temp: 97.6 F (36.4 C)  TempSrc: Oral  SpO2: 93%  Weight: (!) 330 lb 12.8 oz (150 kg)  Height: 5\' 4"  (1.626 m)     Body mass index is 56.78 kg/m.  Physical Exam:   Physical Exam  Constitutional: She appears well-nourished.  HENT:  Head: Normocephalic and atraumatic.  Eyes: Pupils are equal, round, and reactive to light. EOM are normal.  Neck: Normal range of  motion. Neck supple.  Cardiovascular: Regular rhythm and intact distal pulses. Tachycardia present.  Murmur heard.  Systolic murmur is present with a grade of 3/6. Pulmonary/Chest: Effort normal. She has wheezes. She has rales.  Abdominal: Soft. There is generalized tenderness.  Musculoskeletal: She exhibits edema.  Skin: Skin is warm. Capillary refill takes less than 2 seconds.  Psychiatric: She has a normal mood and affect. Her behavior is normal.  Nursing note and vitals reviewed.   Assessment and Plan:   Edith was seen today for shortness of breath.  Diagnoses and all orders for this visit:  Shortness of breath -     DG Chest 2 View; Future -     Ambulatory referral to Pulmonology -  CT Abdomen Pelvis W Contrast -     CBC with Differential/Platelet -     Comprehensive metabolic panel -     TSH  Generalized abdominal pain -     CT Abdomen Pelvis W Contrast -     CBC with Differential/Platelet -     Comprehensive metabolic panel -     TSH  Weight gain -     CT Abdomen Pelvis W Contrast -     CBC with Differential/Platelet -     Comprehensive metabolic panel -     TSH   . Reviewed expectations re: course of current medical issues. . Discussed self-management of symptoms. . Outlined signs and symptoms indicating need for more acute intervention. . Patient verbalized understanding and all questions were answered. [name] Health Maintenance issues including appropriate healthy diet, exercise, and smoking avoidance were discussed with patient. . See orders for this visit as documented in the electronic medical record. . Patient received an After Visit Summary.  Briscoe Deutscher, DO [place], [facility] 07/14/2017  Future Appointments  Date Time Provider [place]  08/19/2017 10:20 AM Minus Breeding, MD [facility]   CMA served as scribe during this visit. History, Physical, and Plan performed by medical provider. The above documentation has been  reviewed and is accurate and complete. Briscoe Deutscher, D.O.  Records requested if needed. Time spent with the patient: 60 minutes, of which >50% was spent in obtaining information about her symptoms, reviewing her previous labs, evaluations, and treatments, counseling her about her condition (please see the discussed topics above), and developing a plan to further investigate it; she had a number of questions which I addressed.

## 2017-07-14 NOTE — Telephone Encounter (Signed)
Patient came in for appointment.  

## 2017-07-14 NOTE — Telephone Encounter (Signed)
See note

## 2017-07-14 NOTE — Telephone Encounter (Signed)
Spoke with husband and patient and offered 3:00 appointment but patient is currently at PCP. Advised to have PCP address issue and if needs cardiology call back.

## 2017-07-14 NOTE — Telephone Encounter (Signed)
Pt. Reports she has been having shortness of breath "for over a year and it's just getting worse." Reports she has been in [place] taking care of her mother ,who has recently died. States sometimes she has wheezing. Gets short of breath walking around in her home. Denies chest pain or cough. Appointment made for today. Reason for Disposition . [1] MODERATE longstanding difficulty breathing (e.g., speaks in phrases, SOB even at rest, pulse 100-120) AND [2] SAME as normal  Answer Assessment - Initial Assessment Questions 1. RESPIRATORY STATUS: "Describe your breathing?" (e.g., wheezing, shortness of breath, unable to speak, severe coughing)      Shortness of breath at rest; periods of wheezing 2. ONSET: "When did this breathing problem begin?"      More than a year ago 3. PATTERN "Does the difficult breathing come and go, or has it been constant since it started?"      Getting worse 4. SEVERITY: "How bad is your breathing?" (e.g., mild, moderate, severe)    - MILD: No SOB at rest, mild SOB with walking, speaks normally in sentences, can lay down, no retractions, pulse < 100.    - MODERATE: SOB at rest, SOB with minimal exertion and prefers to sit, cannot lie down flat, speaks in phrases, mild retractions, audible wheezing, pulse 100-120.    - SEVERE: Very SOB at rest, speaks in single words, struggling to breathe, sitting hunched forward, retractions, pulse > 120      Moderate 5. RECURRENT SYMPTOM: "Have you had difficulty breathing before?" If so, ask: "When was the last time?" and "What happened that time?"      This has been going on for years 6. CARDIAC HISTORY: "Do you have any history of heart disease?" (e.g., heart attack, angina, bypass surgery, angioplasty)      Cardiomyopathy 7. LUNG HISTORY: "Do you have any history of lung disease?"  (e.g., pulmonary embolus, asthma, emphysema)     No 8. CAUSE: "What do you think is causing the breathing problem?"      Unsure 9. OTHER SYMPTOMS: "Do  you have any other symptoms? (e.g., dizziness, runny nose, cough, chest pain, fever)     No 10. PREGNANCY: "Is there any chance you are pregnant?" "When was your last menstrual period?"       No 11. TRAVEL: "Have you traveled out of the country in the last month?" (e.g., travel history, exposures)       No  Protocols used: BREATHING DIFFICULTY-A-AH

## 2017-07-14 NOTE — Telephone Encounter (Signed)
New message    Pt c/o Shortness Of Breath: STAT if SOB developed within the last 24 hours or pt is noticeably SOB on the phone  1. Are you currently SOB (can you hear that pt is SOB on the phone)? Yes. Can not hear on phone, speaking to husband  2. How long have you been experiencing SOB? Started 3 days ago  3. Are you SOB when sitting or when up moving around? Both.   4. Are you currently experiencing any other symptoms? Pt husband states that pt is having a full feeling in her stomach   Pt husband requested appt. Wanted to see Dr. Percival Spanish, scheduled for June 23rd, 10:20am. Pt was put on waiting list.

## 2017-07-14 NOTE — Progress Notes (Deleted)
[name] is a 51 y.o. female here for an acute visit.  History of Present Illness:   [name], CMA acting as scribe for Dr. Briscoe Deutscher.   HPI: Patient here today for Shortness of breath. She is not having any cough. Some wheezing. She has been taking double lasix like her cardiology directed her to.  She is working on getting follow up with cardiology.   Patient just coming back from [place] where she was taking care of mother until she passed. She was their for over two months. She had hard time with nursing home making mistakes.   PMHx, SurgHx, SocialHx, Medications, and Allergies were reviewed in the Visit Navigator and updated as appropriate.  Current Medications:   Current Outpatient Medications:  .  amLODipine (NORVASC) 10 MG tablet, TAKE 1 TABLET BY MOUTH EVERY DAY, Disp: 90 tablet, Rfl: 2 .  busPIRone (BUSPAR) 10 MG tablet, Take 1 tablet (10 mg total) by mouth 2 (two) times daily., Disp: 180 tablet, Rfl: 0 .  carvedilol (COREG) 25 MG tablet, Take 1 tablet (25 mg total) by mouth 2 (two) times daily with a meal., Disp: 180 tablet, Rfl: 3 .  clonazePAM (KLONOPIN) 1 MG tablet, Take .5 mg qd prn anxiety, Disp: 30 tablet, Rfl: 1 .  COREG 12.5 MG tablet, Take 1 tablet (12.5 mg total) by mouth 2 (two) times daily., Disp: 180 tablet, Rfl: 3 .  [facility] DAILY FIBER 0.52 g capsule, TAKE 1 CAPSULE (0.52 G TOTAL) BY MOUTH DAILY., Disp: 100 capsule, Rfl: 0 .  dicyclomine (BENTYL) 20 MG tablet, Take 1 tablet (20 mg total) by mouth every 6 (six) hours., Disp: 30 tablet, Rfl: 0 .  enalapril (VASOTEC) 20 MG tablet, TAKE 1 TABLET (20 MG TOTAL) BY MOUTH 2 (TWO) TIMES DAILY., Disp: 180 tablet, Rfl: 2 .  gabapentin (NEURONTIN) 100 MG capsule, TAKE 2 CAPSULES (200 MG TOTAL) BY MOUTH 3 (THREE) TIMES DAILY., Disp: 270 capsule, Rfl: 0 .  HYDROcodone-acetaminophen (NORCO/VICODIN) 5-325 MG tablet, Take 1 tablet by mouth every 6 (six) hours as needed for moderate pain., Disp: 20 tablet, Rfl: 0 .   ibuprofen (ADVIL,MOTRIN) 800 MG tablet, Take 1 tablet (800 mg total) by mouth every 8 (eight) hours as needed for headache or mild pain., Disp: 30 tablet, Rfl: 1 .  lamoTRIgine (LAMICTAL) 200 MG tablet, Take 1 tablet (200 mg total) by mouth daily., Disp: 30 tablet, Rfl: 0 .  potassium chloride SA (KLOR-CON M20) 20 MEQ tablet, Take 1 tablet (20 mEq total) by mouth daily., Disp: 90 tablet, Rfl: 3 .  sertraline (ZOLOFT) 100 MG tablet, Take one and half tablets PO QDAY, Disp: 90 tablet, Rfl: 0 .  simvastatin (ZOCOR) 20 MG tablet, TAKE 1 TABLET (20 MG TOTAL) BY MOUTH DAILY., Disp: 90 tablet, Rfl: 2 .  torsemide (DEMADEX) 20 MG tablet, Take 40 mg in the morning and 20 mg 6 hours later, Disp: , Rfl:    Allergies  Allergen Reactions  . Fluoxetine Other (See Comments)    More depressed  . Cymbalta [Duloxetine Hcl] Other (See Comments)    depressed  . Duloxetine     REACTION: Sucidal thoughts  . Penicillins     REACTION: rash Has patient had a PCN reaction causing immediate rash, facial/tongue/throat swelling, SOB or lightheadedness with hypotension: YES Has patient had a PCN reaction causing severe rash involving mucus membranes or skin necrosis: NO Has patient had a PCN reaction that required hospitalization: YES Has patient had a PCN reaction occurring  within the last 10 years: NO If all of the above answers are "NO", then may proceed with Cephalosporin use.   . Prozac [Fluoxetine Hcl] Other (See Comments)    More depressed   Review of Systems:   Pertinent items are noted in the HPI. Otherwise, ROS is negative.  Vitals:   Vitals:   07/14/17 1422  BP: 126/68  Pulse: 82  Temp: 97.6 F (36.4 C)  TempSrc: Oral  SpO2: 93%  Weight: (!) 330 lb 12.8 oz (150 kg)  Height: 5\' 4"  (1.626 m)     Body mass index is 56.78 kg/m.  Physical Exam:   Physical Exam Results for orders placed or performed in visit on 03/11/17  Urine Culture  Result Value Ref Range   MICRO NUMBER: [id number]     SPECIMEN QUALITY: ADEQUATE    Sample Source NOT GIVEN    STATUS: FINAL    Result: No Growth   Comprehensive metabolic panel  Result Value Ref Range   Sodium 141 135 - 145 mEq/L   Potassium 4.0 3.5 - 5.1 mEq/L   Chloride 101 96 - 112 mEq/L   CO2 32 19 - 32 mEq/L   Glucose, Bld 124 (H) 70 - 99 mg/dL   BUN 20 6 - 23 mg/dL   Creatinine, Ser 0.85 0.40 - 1.20 mg/dL   Total Bilirubin 0.5 0.2 - 1.2 mg/dL   Alkaline Phosphatase 41 39 - 117 U/L   AST 16 0 - 37 U/L   ALT 16 0 - 35 U/L   Total Protein 7.3 6.0 - 8.3 g/dL   Albumin 3.8 3.5 - 5.2 g/dL   Calcium 8.7 8.4 - 10.5 mg/dL   GFR 74.71 >60.00 mL/min  POCT urinalysis dipstick  Result Value Ref Range   Color, UA Yellow    Clarity, UA Cloudy    Glucose, UA Negative    Bilirubin, UA Negative    Ketones, UA Negative    Spec Grav, UA 1.020 1.010 - 1.025   Blood, UA Negative    pH, UA 5.0 5.0 - 8.0   Protein, UA Negative    Urobilinogen, UA 0.2 0.2 or 1.0 E.U./dL   Nitrite, UA Negative    Leukocytes, UA Small (1+) (A) Negative   Appearance     Odor      Assessment and Plan:   There are no diagnoses linked to this encounter.  . Reviewed expectations re: course of current medical issues. . Discussed self-management of symptoms. . Outlined signs and symptoms indicating need for more acute intervention. . Patient verbalized understanding and all questions were answered. [name] Health Maintenance issues including appropriate healthy diet, exercise, and smoking avoidance were discussed with patient. . See orders for this visit as documented in the electronic medical record. . Patient received an After Visit Summary.  CMA served as Education administrator during this visit. History, Physical, and Plan performed by medical provider. The above documentation has been reviewed and is accurate and complete. Briscoe Deutscher, [place], [place], [place] 07/14/2017  ***Did you take ownership of the note?

## 2017-07-15 LAB — CBC WITH DIFFERENTIAL/PLATELET
Basophils Absolute: 0.1 10*3/uL (ref 0.0–0.1)
Basophils Relative: 0.9 % (ref 0.0–3.0)
Eosinophils Absolute: 0.1 10*3/uL (ref 0.0–0.7)
Eosinophils Relative: 1.2 % (ref 0.0–5.0)
HCT: 42.3 % (ref 36.0–46.0)
Hemoglobin: 14 g/dL (ref 12.0–15.0)
Lymphocytes Relative: 17.7 % (ref 12.0–46.0)
Lymphs Abs: 1.9 10*3/uL (ref 0.7–4.0)
MCHC: 33.1 g/dL (ref 30.0–36.0)
MCV: 84.2 fl (ref 78.0–100.0)
Monocytes Absolute: 0.7 10*3/uL (ref 0.1–1.0)
Monocytes Relative: 6.4 % (ref 3.0–12.0)
Neutro Abs: 7.7 10*3/uL (ref 1.4–7.7)
Neutrophils Relative %: 73.8 % (ref 43.0–77.0)
Platelets: 275 10*3/uL (ref 150.0–400.0)
RBC: 5.02 Mil/uL (ref 3.87–5.11)
RDW: 15.1 % (ref 11.5–15.5)
WBC: 10.5 10*3/uL (ref 4.0–10.5)

## 2017-07-15 LAB — COMPREHENSIVE METABOLIC PANEL
ALT: 17 U/L (ref 0–35)
AST: 19 U/L (ref 0–37)
Albumin: 3.8 g/dL (ref 3.5–5.2)
Alkaline Phosphatase: 33 U/L — ABNORMAL LOW (ref 39–117)
BUN: 10 mg/dL (ref 6–23)
CO2: 31 mEq/L (ref 19–32)
Calcium: 9 mg/dL (ref 8.4–10.5)
Chloride: 98 mEq/L (ref 96–112)
Creatinine, Ser: 0.71 mg/dL (ref 0.40–1.20)
GFR: 91.83 mL/min (ref >60.00)
Glucose, Bld: 99 mg/dL (ref 70–99)
Potassium: 4 mEq/L (ref 3.5–5.1)
Sodium: 138 mEq/L (ref 135–145)
Total Bilirubin: 0.7 mg/dL (ref 0.2–1.2)
Total Protein: 7.6 g/dL (ref 6.0–8.3)

## 2017-07-15 LAB — TSH: TSH: 4.18 u[IU]/mL (ref 0.35–4.50)

## 2017-07-20 ENCOUNTER — Encounter: Payer: Self-pay | Admitting: Family Medicine

## 2017-07-22 ENCOUNTER — Telehealth: Payer: Self-pay | Admitting: Family Medicine

## 2017-07-22 NOTE — Telephone Encounter (Signed)
See note

## 2017-07-22 NOTE — Telephone Encounter (Unsigned)
Copied from [place] [phone]. Topic: Quick Communication - See Telephone Encounter >> Jul 22, 2017 10:50 AM Neva Seat wrote: Lab results from last Mon 07-14-17. Pt is having a rash that has been on legs and hands. - Not sure if it's ringworm. Pt needing Cpap supplier - needing supplies.  Pt needing to know if she is being referred to the pulmonologist - CT scan. Pt is having heavy periods.  Furosemide and  potassium chloride SA (KLOR-CON M20) 20 MEQ tablet - pt is having questions on her dosage and directions.

## 2017-07-23 NOTE — Telephone Encounter (Signed)
Left message to return call to our office.  

## 2017-07-23 NOTE — Telephone Encounter (Signed)
Called patient to address all the following:   Lab results from last Mon 07-14-17. We have not received lab results informed patient that I would send message to provider.  Pt is having a rash that has been on legs and hands. - Not sure if it's ringworm. Patient informed that she will need to be evaluated for that app made in 1 hr slot on Monday  Pt needing Cpap supplier - needing supplies. I know that we have been back and forth patient states that she is using cpap but she is almost out of supplies. States that we have ordered and she received call but could not talk at the time and never got call back.  Pt needing to know if she is being referred to the pulmonologist - CT scan. Has app Monday for CT and referral in process. Patient informed.   Pt is having heavy periods.  Does not have cycle but every few months states she has been on for 5 days now and is very heavy. Bleeding through large pad every 15-20 minutes. She is having large clots as well. I advised multiple times to go to ED that this need to be evaluated. She states that we sent to GYN in past for it. I have reviewed chart and do not see referral. Patient refused to go to ED.     Furosemide and  potassium chloride SA (KLOR-CON M20) 20 MEQ tablet - pt is having questions on her dosage and directions. Patient wants to know if she should be taking two tab twice a day or one bid.   **Pt is in long app on Monday.

## 2017-07-28 ENCOUNTER — Ambulatory Visit (INDEPENDENT_AMBULATORY_CARE_PROVIDER_SITE_OTHER): Payer: Medicare Other | Admitting: Family Medicine

## 2017-07-28 ENCOUNTER — Ambulatory Visit (INDEPENDENT_AMBULATORY_CARE_PROVIDER_SITE_OTHER)
Admission: RE | Admit: 2017-07-28 | Discharge: 2017-07-28 | Disposition: A | Discharge status: Home / Self Care | Payer: Medicare Other | Source: Ambulatory Visit | Attending: Family Medicine | Admitting: Family Medicine

## 2017-07-28 VITALS — BP 118/72 | HR 85 | Temp 98.9°F | Ht 64.0 in | Wt 329.6 lb

## 2017-07-28 DIAGNOSIS — G4733 Obstructive sleep apnea (adult) (pediatric): Secondary | ICD-10-CM | POA: Diagnosis not present

## 2017-07-28 DIAGNOSIS — R0602 Shortness of breath: Secondary | ICD-10-CM | POA: Diagnosis not present

## 2017-07-28 DIAGNOSIS — R1084 Generalized abdominal pain: Secondary | ICD-10-CM

## 2017-07-28 DIAGNOSIS — F902 Attention-deficit hyperactivity disorder, combined type: Secondary | ICD-10-CM | POA: Diagnosis not present

## 2017-07-28 DIAGNOSIS — N200 Calculus of kidney: Secondary | ICD-10-CM | POA: Diagnosis not present

## 2017-07-28 DIAGNOSIS — R21 Rash and other nonspecific skin eruption: Secondary | ICD-10-CM

## 2017-07-28 DIAGNOSIS — N926 Irregular menstruation, unspecified: Secondary | ICD-10-CM | POA: Diagnosis not present

## 2017-07-28 MED ORDER — IOPAMIDOL (ISOVUE-300) INJECTION 61%
100.0000 mL | Freq: Once | INTRAVENOUS | Status: AC | PRN
  Administered 2017-07-28: 100 mL via INTRAVENOUS

## 2017-07-28 MED ORDER — CLOTRIMAZOLE-BETAMETHASONE 1-0.05 % EX CREA: 1.0000 "application " | TOPICAL_CREAM | Freq: Two times a day (BID) | CUTANEOUS | 2 refills | Status: DC

## 2017-07-28 MED ORDER — PSYLLIUM 500 MG PO CAPS: ORAL_CAPSULE | ORAL | 2 refills | Status: DC

## 2017-07-28 NOTE — Progress Notes (Signed)
[name] is a 52 y.o. female is here for follow up.  History of Present Illness:  [name], CMA acting as scribe for Dr. Briscoe Deutscher.   HPI: Patient in for follow up on CT as well as labs. She also has detailed message that she would like to address as well.    Rash:Both hands, both legs and feet. She has few spots that look like ring worms.   Labs: Lab results were reviewed with patient in office informed that they are good.   Menstrual cycle: Patient states that she has had abnormal cycle for several years but got better as she got older. Now she does not have every month but they are very heavy. She is not able to walk around the room without having to change tampon. She was sent to GYN but patient never followed up. She would like to have another referral to GYN to talk about that further.   Results CT reviewed with patient in office and all questions were answered. Copy given of labs and both CT and labs for patient records.   Abdominal pain: Patient is concerned that with normal CT why is she still having a lot of tightness and abdominal pain that causes shortness of breath.  She is not able to to walk across the room with out shortness of breath. Patient is not interested in having bariatric surgery and does not feel that it would be helpful for her. She would like to get [place]. She has had hard time getting covered in the past by her insurance. She is afraid to exercise at all with out supervision. She is not interested in meeting with nutrition.   She has not seen in therapy recently but will work on getting appointment with them. She is having increased depression due to medical issues and emotional issues.  She feels like a lot of problems are emotional. She is having increased anxiety due to taking her medications.   Health Maintenance Due  Topic Date Due  . TETANUS/TDAP  05/28/1984  . MAMMOGRAM  05/29/2015  . PAP SMEAR   08/08/2015   Depression screen [facility] 2/9 10/18/2016 06/26/2016 07/19/2015  Decreased Interest 2 2 1   Down, Depressed, Hopeless 3 2 1   PHQ - 2 Score 5 4 2   Altered sleeping 0 2 1  Tired, decreased energy 0 3 1  Change in appetite 1 3 1   Feeling bad or failure about yourself  1 3 0  Trouble concentrating 0 3 0  Moving slowly or fidgety/restless 0 3 0  Suicidal thoughts 1 0 0  PHQ-9 Score 8 21 5   Difficult doing work/chores Not difficult at all Extremely dIfficult Somewhat difficult  Some recent data might be hidden   PMHx, SurgHx, SocialHx, FamHx, Medications, and Allergies were reviewed in the Visit Navigator and updated as appropriate.   Patient Active Problem List   Diagnosis Date Noted  . Acute on chronic systolic CHF (congestive heart failure) ([place]) 10/19/2016  . Hyperglycemia 10/19/2016  . SOB (shortness of breath) 06/22/2016  . Smoker 06/22/2016  . Normal coronary arteries 04/17/2016  . Obstructive sleep apnea syndrome 10/28/2014  . Major depressive disorder 10/28/2014  . Chronic systolic CHF (congestive heart failure), NYHA class 3 ([place]) 12/06/2013  . Dyslipidemia 04/04/2011  . Hypertension 06/03/2008  . Morbid obesity ([place]) 07/12/2006  . NICM (nonischemic cardiomyopathy) ([place]) 07/12/2006  . Attention deficit disorder 07/12/2006   Social History   Tobacco Use  .  Smoking status: Former Smoker    Packs/day: 0.50    Types: Cigarettes    Last attempt to quit: 02/25/1997    Years since quitting: 20.4  . Smokeless tobacco: Never Used  . Tobacco comment: Married, lives with spouse (when he is not traveling) and son  Substance Use Topics  . Alcohol use: No  . Drug use: No   Current Medications and Allergies:   Current Outpatient Medications:  .  amLODipine (NORVASC) 10 MG tablet, TAKE 1 TABLET BY MOUTH EVERY DAY, Disp: 90 tablet, Rfl: 2 .  busPIRone (BUSPAR) 10 MG tablet, Take 1 tablet (10 mg total) by mouth 2 (two) times daily., Disp: 180 tablet, Rfl: 0 .  carvedilol  (COREG) 25 MG tablet, Take 1 tablet (25 mg total) by mouth 2 (two) times daily with a meal., Disp: 180 tablet, Rfl: 3 .  clonazePAM (KLONOPIN) 1 MG tablet, Take .5 mg qd prn anxiety, Disp: 30 tablet, Rfl: 1 .  COREG 12.5 MG tablet, Take 1 tablet (12.5 mg total) by mouth 2 (two) times daily., Disp: 180 tablet, Rfl: 3 .  clotrimazole-betamethasone (LOTRISONE) cream, Apply 1 application topically 2 (two) times daily., Disp: 45 g, Rfl: 2 .  dicyclomine (BENTYL) 20 MG tablet, Take 1 tablet (20 mg total) by mouth every 6 (six) hours. (Patient not taking: Reported on 07/28/2017), Disp: 30 tablet, Rfl: 0 .  enalapril (VASOTEC) 20 MG tablet, TAKE 1 TABLET (20 MG TOTAL) BY MOUTH 2 (TWO) TIMES DAILY. (Patient not taking: Reported on 07/28/2017), Disp: 180 tablet, Rfl: 2 .  furosemide (LASIX) 20 MG tablet, Two tablet by mouth twice a day, Disp: 360 tablet, Rfl: 1 .  gabapentin (NEURONTIN) 100 MG capsule, TAKE 2 CAPSULES (200 MG TOTAL) BY MOUTH 3 (THREE) TIMES DAILY. (Patient not taking: Reported on 07/28/2017), Disp: 270 capsule, Rfl: 0 .  HYDROcodone-acetaminophen (NORCO/VICODIN) 5-325 MG tablet, Take 1 tablet by mouth every 6 (six) hours as needed for moderate pain. (Patient not taking: Reported on 07/28/2017), Disp: 20 tablet, Rfl: 0 .  ibuprofen (ADVIL,MOTRIN) 800 MG tablet, Take 1 tablet (800 mg total) by mouth every 8 (eight) hours as needed for headache or mild pain. (Patient not taking: Reported on 07/28/2017), Disp: 30 tablet, Rfl: 1 .  lamoTRIgine (LAMICTAL) 200 MG tablet, Take 1 tablet (200 mg total) by mouth daily. (Patient not taking: Reported on 07/28/2017), Disp: 30 tablet, Rfl: 0 .  potassium chloride SA (KLOR-CON M20) 20 MEQ tablet, Take 1 tablet (20 mEq total) by mouth 2 (two) times daily. (Patient not taking: Reported on 07/28/2017), Disp: 180 tablet, Rfl: 3 .  Psyllium (NAT-RUL PSYLLIUM SEED HUSKS) 500 MG CAPS, 1-2 po qid as needed, Disp: 240 each, Rfl: 2 .  sertraline (ZOLOFT) 100 MG tablet, Take one and  half tablets PO QDAY (Patient not taking: Reported on 07/28/2017), Disp: 90 tablet, Rfl: 0 .  simvastatin (ZOCOR) 20 MG tablet, TAKE 1 TABLET (20 MG TOTAL) BY MOUTH DAILY. (Patient not taking: Reported on 07/28/2017), Disp: 90 tablet, Rfl: 2   Allergies  Allergen Reactions  . Fluoxetine Other (See Comments)    More depressed  . Cymbalta [Duloxetine Hcl] Other (See Comments)    depressed  . Duloxetine     REACTION: Sucidal thoughts  . Penicillins     REACTION: rash Has patient had a PCN reaction causing immediate rash, facial/tongue/throat swelling, SOB or lightheadedness with hypotension: YES Has patient had a PCN reaction causing severe rash involving mucus membranes or skin necrosis: NO Has patient  had a PCN reaction that required hospitalization: YES Has patient had a PCN reaction occurring within the last 10 years: NO If all of the above answers are "NO", then may proceed with Cephalosporin use.   . Prozac [Fluoxetine Hcl] Other (See Comments)    More depressed   Review of Systems   Pertinent items are noted in the HPI. Otherwise, ROS is negative.  Vitals:   Vitals:   07/28/17 1257  BP: 118/72  Pulse: 85  Temp: 98.9 F (37.2 C)  TempSrc: Oral  SpO2: 93%  Weight: (!) 329 lb 9.6 oz (149.5 kg)  Height: 5\' 4"  (1.626 m)     Body mass index is 56.58 kg/m.  Physical Exam:   Physical Exam  Constitutional: She appears well-nourished.  HENT:  Head: Normocephalic and atraumatic.  Eyes: Pupils are equal, round, and reactive to light. EOM are normal.  Neck: Normal range of motion. Neck supple.  Cardiovascular: Regular rhythm and intact distal pulses. Tachycardia present.  Murmur heard.  Systolic murmur is present with a grade of 3/6. Pulmonary/Chest: Effort normal.  Abdominal: Soft. There is generalized tenderness.  Musculoskeletal: She exhibits edema.  Skin: Skin is warm. Capillary refill takes less than 2 seconds.  Psychiatric: She has a normal mood and affect. Her  behavior is normal. Her speech is tangential.  Nursing note and vitals reviewed.  Results for orders placed or performed in visit on 07/14/17  CBC with Differential/Platelet  Result Value Ref Range   WBC 10.5 4.0 - 10.5 K/uL   RBC 5.02 3.87 - 5.11 Mil/uL   Hemoglobin 14.0 12.0 - 15.0 g/dL   HCT 42.3 36.0 - 46.0 %   MCV 84.2 78.0 - 100.0 fl   MCHC 33.1 30.0 - 36.0 g/dL   RDW 15.1 11.5 - 15.5 %   Platelets 275.0 150.0 - 400.0 K/uL   Neutrophils Relative % 73.8 43.0 - 77.0 %   Lymphocytes Relative 17.7 12.0 - 46.0 %   Monocytes Relative 6.4 3.0 - 12.0 %   Eosinophils Relative 1.2 0.0 - 5.0 %   Basophils Relative 0.9 0.0 - 3.0 %   Neutro Abs 7.7 1.4 - 7.7 K/uL   Lymphs Abs 1.9 0.7 - 4.0 K/uL   Monocytes Absolute 0.7 0.1 - 1.0 K/uL   Eosinophils Absolute 0.1 0.0 - 0.7 K/uL   Basophils Absolute 0.1 0.0 - 0.1 K/uL  Comprehensive metabolic panel  Result Value Ref Range   Sodium 138 135 - 145 mEq/L   Potassium 4.0 3.5 - 5.1 mEq/L   Chloride 98 96 - 112 mEq/L   CO2 31 19 - 32 mEq/L   Glucose, Bld 99 70 - 99 mg/dL   BUN 10 6 - 23 mg/dL   Creatinine, Ser 0.71 0.40 - 1.20 mg/dL   Total Bilirubin 0.7 0.2 - 1.2 mg/dL   Alkaline Phosphatase 33 (L) 39 - 117 U/L   AST 19 0 - 37 U/L   ALT 17 0 - 35 U/L   Total Protein 7.6 6.0 - 8.3 g/dL   Albumin 3.8 3.5 - 5.2 g/dL   Calcium 9.0 8.4 - 10.5 mg/dL   GFR 91.83 >60.00 mL/min  TSH  Result Value Ref Range   TSH 4.18 0.35 - 4.50 uIU/mL    Assessment and Plan:   [name] was seen today for follow-up.  Diagnoses and all orders for this visit:  Abnormal menses Comments: To GYN. Was offered ablation in the past.  Orders: -     Ambulatory referral to  Gynecology  Rash of hands Comments: Atopic appearing.  Morbid obesity ([place]) Comments: Discussed weight loss treatments, including bariatric surgery. Patient states that she knows how to lose weight and will restart her diet. Interested in PT.  SOB (shortness of breath) Comments: CT  chest, abdomen, pelvis without cause. Likely all obesity-related, deconditioning.   Obstructive sleep apnea syndrome Comments: Will order CPAP supplies. Referral to Pulmonology.  Attention deficit hyperactivity disorder (ADHD), combined type Comments: No stimulants due to CVD.  Other orders -     Psyllium (NAT-RUL PSYLLIUM SEED HUSKS) 500 MG CAPS; 1-2 po qid as needed -     clotrimazole-betamethasone (LOTRISONE) cream; Apply 1 application topically 2 (two) times daily.    . Reviewed expectations re: course of current medical issues. . Discussed self-management of symptoms. . Outlined signs and symptoms indicating need for more acute intervention. . Patient verbalized understanding and all questions were answered. [name] Health Maintenance issues including appropriate healthy diet, exercise, and smoking avoidance were discussed with patient. . See orders for this visit as documented in the electronic medical record. . Patient received an After Visit Summary.  Briscoe Deutscher, DO [place], [place] 08/03/2017  Future Appointments  Date Time Provider [place]  08/19/2017 10:20 AM Minus Breeding, MD [facility]  08/21/2017  3:00 PM Salvadore Dom, MD [place] None  09/02/2017  9:15 AM Marshell Garfinkel, MD [facility] None  10/28/2017  9:20 AM Briscoe Deutscher, DO [facility]   CMA served as scribe during this visit. History, Physical, and Plan performed by medical provider. The above documentation has been reviewed and is accurate and complete. Briscoe Deutscher, D.O.  Records requested if needed. Time spent with the patient: 60 minutes, of which >50% was spent in obtaining information about her symptoms, reviewing her previous labs, evaluations, and treatments, counseling her about her condition (please see the discussed topics above), and developing a plan to further investigate it; she had a number of questions which I addressed.

## 2017-07-31 ENCOUNTER — Ambulatory Visit: Payer: Self-pay

## 2017-07-31 ENCOUNTER — Other Ambulatory Visit: Payer: Self-pay

## 2017-07-31 NOTE — Telephone Encounter (Signed)
Please advise was going to call in refills but not sure what dose of furosemide she is on.

## 2017-07-31 NOTE — Telephone Encounter (Signed)
See note

## 2017-07-31 NOTE — Telephone Encounter (Signed)
  Reason for Disposition . General information question, no triage required and triager able to answer question  Answer Assessment - Initial Assessment Questions 1. REASON FOR CALL or QUESTION: "What is your reason for calling today?" or "How can I best help you?" or "What question do you have that I can help answer?"     Pt calling regarding several issues: Pt states that she is having intermittent issues with SOB and her CPAP. Pt states that she has been worked up several times and has had imaging, blood work, colonoscopy and endoscopy and tests were all negative. Pt states that she feels like her stomach is bloated like she "ate an elephant." Pt states this has been an issue for several years.  She was asking about if a referral has been made for to the pulmonologist.   NT made appt for pt July 9 at 0915 with Dr. Concepcion Living. Pt provided with address and phone number to [facility] pulmonology. She stated that she needs new CPAP supplies. She stated that her breathing issues are affecting her life and she keeps gaining weight. Pt stated that she was interested in calling [facility] to try to get active again.   Pt requesting contact info for supplier for CPAP supplies. Pt is also requesting a 90 day supply for her Furosemide which she stated that she is taking 2 pills in the am and 2 pills in the evening. This drug was not noted on her med list. She also stated that she needs 90 prescription for her potassium. She stated that Dr Juleen China increased dose from 1 per day to 2 per day. She would [facility] [address]  Protocols used: INFORMATION ONLY CALL-A-AH

## 2017-08-01 MED ORDER — FUROSEMIDE 20 MG PO TABS: ORAL_TABLET | ORAL | 1 refills | Status: DC

## 2017-08-01 NOTE — Telephone Encounter (Signed)
Called patient let her know that we called in refills and order will be sent for c pap

## 2017-08-01 NOTE — Telephone Encounter (Signed)
To Pulmonology for CPAP supplies, sleep study items. Call in Lasix 20 mg tabs for her. Okay 90 day refill.

## 2017-08-01 NOTE — Addendum Note (Signed)
Addended by: Francella Solian on: 08/01/2017 03:38 PM   Modules accepted: Orders

## 2017-08-03 ENCOUNTER — Encounter: Payer: Self-pay | Admitting: Family Medicine

## 2017-08-04 ENCOUNTER — Telehealth: Payer: Self-pay | Admitting: Cardiology

## 2017-08-04 NOTE — Telephone Encounter (Signed)
New Message:      Pt wants to get enrolled in the Cardiac Rehab Program,she will fax over the referral.

## 2017-08-04 NOTE — Telephone Encounter (Signed)
Faxed signed cardiac rehab form to [facility] [place].

## 2017-08-04 NOTE — Telephone Encounter (Signed)
Will forward to nya, dr hochrein's medical assistant to watch for referral.

## 2017-08-07 ENCOUNTER — Telehealth: Payer: Self-pay | Admitting: Family Medicine

## 2017-08-07 NOTE — Telephone Encounter (Signed)
See note.   Copied from [place] [id number]. Topic: Referral - Status >> Jul 31, 2017  9:45 AM Conception Chancy, NT wrote: Reason for CRM: patient is calling and states she would like a status on her referral for Pulmonary as she has not heard from anyone and as well as the status of getting Cpap supplies. She states she has not heard from a supply company. She has also not heard from [facility]. She would like a rush on this.  >> Jul 31, 2017 12:32 PM Jasper Loser, CMA wrote: I will check on pulmonology referral. I do not see recent cardiology referral. Will forward to [facility] to follow-up on CPAP supplies and cardiology referral.  >> Jul 31, 2017  1:11 PM Jasper Loser, CMA wrote: It appears pt was contacted today by pulmonology and scheduled for 09/02/17. >> Aug 07, 2017  9:59 AM Ivar Drape wrote: [place] w/[facility] [phone] stated that they need the statement that the patient is complying and benefiting from Cpap therapy on the chart notes. Also the order does not have the doctor's credentials on it.  It just has the doctor's signature on it. So they will need a new order done.

## 2017-08-11 NOTE — Telephone Encounter (Signed)
Patient has app with pulmonology on 09/02/17 at 9:15 per note. Last phone conversation with patient she was given that information by me as well as contact information for that office. I have sent in order for supplies and they should have contacted patient. When I call back I will give her contact for them so that she can call. Just wanted to check and see if you had any idea how to send referral to [facility] ?

## 2017-08-11 NOTE — Telephone Encounter (Signed)
[facility] is the cardiopulmonary rehab, I believe. Send to [place] to complete.

## 2017-08-14 ENCOUNTER — Other Ambulatory Visit: Payer: Self-pay

## 2017-08-14 DIAGNOSIS — R0602 Shortness of breath: Secondary | ICD-10-CM

## 2017-08-15 ENCOUNTER — Telehealth: Payer: Self-pay | Admitting: Family Medicine

## 2017-08-15 NOTE — Telephone Encounter (Signed)
Copied from [place] [phone]. Topic: Quick Communication - See Telephone Encounter >> Aug 15, 2017 10:52 AM Ahmed Prima L wrote: CRM for notification. See Telephone encounter for: 08/15/17.  Raven, from [facility] called and needs chart notes on the patient , the ones in epic did not say she was complying with the cpap. Also, the order that was received at their office did not have any of Dr Alcario Drought information on it.  Call back @ [phone], Fax number [phone]

## 2017-08-15 NOTE — Telephone Encounter (Signed)
See note

## 2017-08-18 ENCOUNTER — Encounter: Payer: Self-pay | Admitting: Cardiology

## 2017-08-18 NOTE — Telephone Encounter (Signed)
[name] I can not figure out if I put the referral in right will you check for me?

## 2017-08-18 NOTE — Progress Notes (Addendum)
HPI The patient presents for evaluation of cardiomyopathy.   She had cath 04/11/16. This revealed normal coronaries, EF 25-30%, mild pulmonary HTN, and elevated LVEDP.  She was in the hospital in August 2018.  She had acute on chronic systolic and diastolic HF. She presents for follow up.   She presents for follow-up.  Her weights have been stable for about a year.  She has had some abdominal distention.  She describes a firm mass.  She can take a deep breath but it gets fullness in her chest when she does this and she feels like she has perhaps early satiety.  She is tried to diet and she has not lost weight.  She does get chronically short of breath walking short to moderate distance on level ground.  She has chronic lower extremity swelling.  She is not having any new PND or orthopnea.  She is not having any new palpitations, presyncope or syncope.  She did have a CT of her abdomen chest and I reviewed these and there were no findings and in particular no suggestion of why she might feel like she has a mass.  She is actually seen a gastroenterologist as well and reports EGD and colonoscopy without etiology.  She is due to see a pulmonologist.  She is had a lot of stress at home.  Since I last saw her her mother died in [place].  She had to go there and take care of those affairs.    Allergies  Allergen Reactions  . Fluoxetine Other (See Comments)    More depressed  . Cymbalta [Duloxetine Hcl] Other (See Comments)    depressed  . Duloxetine     REACTION: Sucidal thoughts  . Penicillins     REACTION: rash Has patient had a PCN reaction causing immediate rash, facial/tongue/throat swelling, SOB or lightheadedness with hypotension: YES Has patient had a PCN reaction causing severe rash involving mucus membranes or skin necrosis: NO Has patient had a PCN reaction that required hospitalization: YES Has patient had a PCN reaction occurring within the last 10 years: NO If all of the above  answers are "NO", then may proceed with Cephalosporin use.   . Prozac [Fluoxetine Hcl] Other (See Comments)    More depressed    Current Outpatient Medications  Medication Sig Dispense Refill  . amLODipine (NORVASC) 10 MG tablet TAKE 1 TABLET BY MOUTH EVERY DAY 90 tablet 2  . busPIRone (BUSPAR) 10 MG tablet Take 1 tablet (10 mg total) by mouth 2 (two) times daily. 180 tablet 0  . carvedilol (COREG) 25 MG tablet Take 1 tablet (25 mg total) by mouth 2 (two) times daily with a meal. 180 tablet 3  . clonazePAM (KLONOPIN) 1 MG tablet Take .5 mg qd prn anxiety 30 tablet 1  . clotrimazole-betamethasone (LOTRISONE) cream Apply 1 application topically 2 (two) times daily. 45 g 2  . COREG 12.5 MG tablet Take 1 tablet (12.5 mg total) by mouth 2 (two) times daily. 180 tablet 3  . dicyclomine (BENTYL) 20 MG tablet Take 1 tablet (20 mg total) by mouth every 6 (six) hours. 30 tablet 0  . enalapril (VASOTEC) 20 MG tablet TAKE 1 TABLET (20 MG TOTAL) BY MOUTH 2 (TWO) TIMES DAILY. 180 tablet 2  . furosemide (LASIX) 20 MG tablet Two tablet by mouth twice a day 360 tablet 1  . gabapentin (NEURONTIN) 100 MG capsule TAKE 2 CAPSULES (200 MG TOTAL) BY MOUTH 3 (THREE) TIMES DAILY. Murphy  capsule 0  . HYDROcodone-acetaminophen (NORCO/VICODIN) 5-325 MG tablet Take 1 tablet by mouth every 6 (six) hours as needed for moderate pain. 20 tablet 0  . ibuprofen (ADVIL,MOTRIN) 800 MG tablet Take 1 tablet (800 mg total) by mouth every 8 (eight) hours as needed for headache or mild pain. 30 tablet 1  . lamoTRIgine (LAMICTAL) 200 MG tablet Take 1 tablet (200 mg total) by mouth daily. 30 tablet 0  . potassium chloride SA (KLOR-CON M20) 20 MEQ tablet Take 1 tablet (20 mEq total) by mouth 2 (two) times daily. 180 tablet 3  . Psyllium (NAT-RUL PSYLLIUM SEED HUSKS) 500 MG CAPS 1-2 po qid as needed 240 each 2  . sertraline (ZOLOFT) 100 MG tablet Take one and half tablets PO QDAY 90 tablet 0  . simvastatin (ZOCOR) 20 MG tablet TAKE 1 TABLET  (20 MG TOTAL) BY MOUTH DAILY. 90 tablet 2   No current facility-administered medications for this visit.     Past Medical History:  Diagnosis Date  . ADD (attention deficit disorder)   . Anxiety   . Cardiomyopathy, peripartum, postpartum   . Carpal tunnel syndrome of right wrist   . CHF (congestive heart failure) ([place])   . Chronic systolic CHF (congestive heart failure), NYHA class 3 ([place]) 12/06/2013  . Depression   . Essential hypertension, benign   . Migraines   . Morbid obesity ([place])   . OSA (obstructive sleep apnea)   . Plantar fasciitis   . Spinal stenosis     Past Surgical History:  Procedure Laterality Date  . CHOLECYSTECTOMY    . RIGHT/LEFT HEART CATH AND CORONARY ANGIOGRAPHY N/A 04/11/2016   Procedure: Right/Left Heart Cath and Coronary Angiography;  Surgeon: Burnell Blanks, MD;  Location: [facility];  Service: Cardiovascular;  Laterality: N/A;   ROS:    As stated in the HPI and negative for all other systems.  PHYSICAL EXAM BP 110/62   Pulse 84   Ht 5\' 4"  (1.626 m)   Wt (!) 332 lb (150.6 kg)   LMP 07/20/2017   BMI 56.99 kg/m   GENERAL:  Well appearing NECK:  No jugular venous distention, waveform within normal limits, carotid upstroke brisk and symmetric, no bruits, no thyromegaly LUNGS:  Clear to auscultation bilaterally CHEST:  Unremarkable HEART:  PMI not displaced or sustained,S1 and S2 within normal limits, no S3, no S4, no clicks, no rubs, no murmurs ABD:  Flat, positive bowel sounds normal in frequency in pitch, no bruits, no rebound, no guarding, no midline pulsatile mass, no hepatomegaly, no splenomegaly, morbidly obese EXT:  2 plus pulses throughout, moderate edema, no cyanosis no clubbing  EKG:   Sinus rhythm, rate 86, left axis deviation, QTC prolonged, possible left atrial enlargement, no acute ST-T wave changes.  Lab Results  Component Value Date   TSH 4.18 07/14/2017     ASSESSMENT AND PLAN  DYSPNEA -  The patient seems  to have baseline dyspnea.  Her volume actually seems to be relatively well controlled.  She has a sense of fullness that has been extensively worked up in looking over her data I do not see a clear etiology for this.  I am going to check an echocardiogram.  Labs are up-to-date.  We could consider increased diuresis.  I will await the results of the echocardiogram.  OBESITY - She has been battling this for years.  She is going to finally start Heart Strides and perhaps this will help.   CHRONIC SYSTOLIC HF -  I will check the echo as above.  I had to go back through the records to figure out why the Spironolactone disappeared.  It went away in December of last year after I was trying to titrate it.  Her primary doctor reports that the patient stated that she stopped it because she was "tired of peeing".  She otherwise tolerated this.  She did not have any issues with excessive diuresis or hyperkalemia.  I will have to see if I can talk her into this again in the future.  ESSENTIAL HYPERTENSION, BENIGN -  This is being managed in the context of treating his CHF  ANXIETY DEPRESSION - She is to follow with Briscoe Deutscher, DO about this.   APNEA - I will bring her back to talk to our sleep nurse about her multiple questions.

## 2017-08-19 ENCOUNTER — Ambulatory Visit (INDEPENDENT_AMBULATORY_CARE_PROVIDER_SITE_OTHER): Payer: Medicare Other | Admitting: Cardiology

## 2017-08-19 ENCOUNTER — Encounter: Payer: Self-pay | Admitting: Cardiology

## 2017-08-19 ENCOUNTER — Encounter

## 2017-08-19 VITALS — BP 110/62 | HR 84 | Ht 64.0 in | Wt 332.0 lb

## 2017-08-19 DIAGNOSIS — G4730 Sleep apnea, unspecified: Secondary | ICD-10-CM | POA: Diagnosis not present

## 2017-08-19 DIAGNOSIS — I5022 Chronic systolic (congestive) heart failure: Secondary | ICD-10-CM

## 2017-08-19 NOTE — Patient Instructions (Signed)
Medication Instructions:  Continue current medications  If you need a refill on your cardiac medications before your next appointment, please call your pharmacy.  Labwork: None Ordered   Testing/Procedures: Your physician has requested that you have an echocardiogram. Echocardiography is a painless test that uses sound waves to create images of your heart. It provides your doctor with information about the size and shape of your heart and how well your heart's chambers and valves are working. This procedure takes approximately one hour. There are no restrictions for this procedure.   Follow-Up: Your physician wants you to follow-up in: 1 Year. You should receive a reminder letter in the mail two months in advance. If you do not receive a letter, please call our office [phone].      Thank you for choosing [facility]!!       

## 2017-08-20 NOTE — Telephone Encounter (Signed)
Called office do not have order at all can you re send that for me.

## 2017-08-21 ENCOUNTER — Encounter

## 2017-08-21 ENCOUNTER — Ambulatory Visit: Payer: Medicare Other | Admitting: Obstetrics and Gynecology

## 2017-08-22 ENCOUNTER — Ambulatory Visit (HOSPITAL_COMMUNITY): Payer: Medicare Other | Attending: Cardiovascular Disease

## 2017-08-22 ENCOUNTER — Other Ambulatory Visit: Payer: Self-pay

## 2017-08-22 DIAGNOSIS — G4733 Obstructive sleep apnea (adult) (pediatric): Secondary | ICD-10-CM | POA: Diagnosis not present

## 2017-08-22 DIAGNOSIS — I429 Cardiomyopathy, unspecified: Secondary | ICD-10-CM | POA: Diagnosis not present

## 2017-08-22 DIAGNOSIS — I110 Hypertensive heart disease with heart failure: Secondary | ICD-10-CM | POA: Diagnosis not present

## 2017-08-22 DIAGNOSIS — I5022 Chronic systolic (congestive) heart failure: Secondary | ICD-10-CM

## 2017-08-22 DIAGNOSIS — Z6841 Body Mass Index (BMI) 40.0 and over, adult: Secondary | ICD-10-CM | POA: Insufficient documentation

## 2017-08-25 ENCOUNTER — Telehealth: Payer: Self-pay | Admitting: *Deleted

## 2017-08-26 ENCOUNTER — Ambulatory Visit (HOSPITAL_COMMUNITY): Payer: Self-pay | Admitting: Psychiatry

## 2017-08-26 ENCOUNTER — Telehealth: Payer: Self-pay | Admitting: *Deleted

## 2017-08-26 NOTE — Telephone Encounter (Signed)
Called patient per Dr Percival Spanish. Patient states that she needs new CPAP supplies however she doesn't have a MDE provider. Patient was notified that she needs a compliance appointment with Dr Claiborne Billings since she has MCR/MCD insurance. I also informed her that I pulled up her CPAP usage information and it shows that she hasn't been using her machine consistently. It shows she last used the machine on June 9th.  Patient gave me multiple reasons why there has been breaks in her using the machine. I stated to the patient that per medicare guidelines she has to be compliant before they will cover any of her CPAP supplies. If compliance cannot be shown she will have to start the process all over again beginning with having another sleep study. While searching the chart I was able to find her compliance note from 2016. At that time Dr Claiborne Billings stated in his note that he would see her only as needed. The patient's download shows her AHI to be elevated at 12. The patient was informed of this and  that I will give the download to Dr Claiborne Billings for review and recommendation.  He may or may not make adjustments to her CPAP pressure until she can get appointment to see him. I will call her again tomorrow to advise of Dr Evette Georges recommendation. Meantime she needs to use the machine nightly in order to become compliant for insurance purposes. Patient voiced understanding and will wait for a return phone call from me tomorrow. I spent 30 minutes with this patient on the phone discussing the importance of  CPAP compliance.

## 2017-08-26 NOTE — Telephone Encounter (Signed)
Left message Dr Claiborne Billings did adjust her CPAP settings. She is to start using her machine tonight and every night in order to become compliant. Someone from our office will be contacting her [facility] appointment.

## 2017-09-02 ENCOUNTER — Ambulatory Visit (INDEPENDENT_AMBULATORY_CARE_PROVIDER_SITE_OTHER): Payer: Medicare Other | Admitting: Pulmonary Disease

## 2017-09-02 ENCOUNTER — Encounter: Payer: Self-pay | Admitting: Pulmonary Disease

## 2017-09-02 ENCOUNTER — Ambulatory Visit (HOSPITAL_COMMUNITY): Payer: Self-pay | Admitting: Psychiatry

## 2017-09-02 VITALS — BP 132/78 | HR 88 | Ht 64.0 in | Wt 331.4 lb

## 2017-09-02 DIAGNOSIS — R0602 Shortness of breath: Secondary | ICD-10-CM | POA: Diagnosis not present

## 2017-09-02 LAB — NITRIC OXIDE: Nitric Oxide: 14

## 2017-09-02 MED ORDER — FLUTICASONE FUROATE-VILANTEROL 200-25 MCG/INH IN AEPB: 1.0000 | INHALATION_SPRAY | Freq: Every day | RESPIRATORY_TRACT | 4 refills | Status: DC

## 2017-09-02 MED ORDER — ALBUTEROL SULFATE HFA 108 (90 BASE) MCG/ACT IN AERS: 2.0000 | INHALATION_SPRAY | Freq: Four times a day (QID) | RESPIRATORY_TRACT | 2 refills | Status: DC | PRN

## 2017-09-02 MED ORDER — FLUTICASONE FUROATE-VILANTEROL 200-25 MCG/INH IN AEPB: 1.0000 | INHALATION_SPRAY | Freq: Every day | RESPIRATORY_TRACT | 0 refills | Status: DC

## 2017-09-02 NOTE — Progress Notes (Signed)
[name]    [id number]    Sep 23, 1965  Primary Care Physician:Wallace, Danae Chen, DO  Referring Physician: Briscoe Deutscher, [place] [address], [place] [address]  Chief complaint: Consult for dyspnea  HPI: 52 year old with history of nonischemic cardiomyopathy, cath with clean coronaries, EF 25-30%, mild pulmonary hypertension, OSA, BMI 57 Referred here for evaluation of dyspnea.  States that she has a feeling of fullness, firmness in the abdomen associated with dyspnea, inability to take a deep breath.  There are no abnormalities on CT chest abdomen pelvis.  She has been worked up by GI recently and had EGD, colonoscopy which are normal as per the patient.  I do not have these records to review.   She also follows with Dr. Percival Spanish for management of chronic systolic heart failure.  She had a recent echocardiogram done and the cardiology office is planning to start her on Entresto  Has history of OSA but is not compliant with CPAP. She is working with cardiology to get her CPAP restarted Also has issues with obesity and gained 30 pounds over the past year.  She is working on starting an exercise program to get her weight down.  Noted to have stress and anxiety, personal stressors at home.  Recently she spend some time in [place] as her mother was ill.  Her mother passed away in 07/25/2017  Pets: 2 cats, no birds, farm animals, dogs Occupation: Used to work in a Theatre manager, nursing home, as a Surveyor, minerals.  Disabled since 2005 Exposures: No known exposures, no mold, hot tub, Jacuzzi Smoking history: 5-pack-year smoker.  Quit in 2016 Travel history: Used to live in [place].  No significant recent travel. Relevant family history: No significant family history.  Outpatient Encounter Medications as of 09/02/2017  Medication Sig  . amLODipine (NORVASC) 10 MG tablet TAKE 1 TABLET BY MOUTH EVERY DAY  . busPIRone (BUSPAR) 10 MG tablet Take 1 tablet (10 mg total) by mouth 2 (two)  times daily.  . carvedilol (COREG) 25 MG tablet Take 1 tablet (25 mg total) by mouth 2 (two) times daily with a meal.  . clonazePAM (KLONOPIN) 1 MG tablet Take .5 mg qd prn anxiety  . clotrimazole-betamethasone (LOTRISONE) cream Apply 1 application topically 2 (two) times daily.  [name] COREG 12.5 MG tablet Take 1 tablet (12.5 mg total) by mouth 2 (two) times daily.  [name] dicyclomine (BENTYL) 20 MG tablet Take 1 tablet (20 mg total) by mouth every 6 (six) hours.  . enalapril (VASOTEC) 20 MG tablet TAKE 1 TABLET (20 MG TOTAL) BY MOUTH 2 (TWO) TIMES DAILY.  . furosemide (LASIX) 20 MG tablet Two tablet by mouth twice a day  . gabapentin (NEURONTIN) 100 MG capsule TAKE 2 CAPSULES (200 MG TOTAL) BY MOUTH 3 (THREE) TIMES DAILY.  [name] HYDROcodone-acetaminophen (NORCO/VICODIN) 5-325 MG tablet Take 1 tablet by mouth every 6 (six) hours as needed for moderate pain.  [name] ibuprofen (ADVIL,MOTRIN) 800 MG tablet Take 1 tablet (800 mg total) by mouth every 8 (eight) hours as needed for headache or mild pain.  [name] lamoTRIgine (LAMICTAL) 200 MG tablet Take 1 tablet (200 mg total) by mouth daily.  . potassium chloride SA (KLOR-CON M20) 20 MEQ tablet Take 1 tablet (20 mEq total) by mouth 2 (two) times daily.  . Psyllium (NAT-RUL PSYLLIUM SEED HUSKS) 500 MG CAPS 1-2 po qid as needed  . sertraline (ZOLOFT) 100 MG tablet Take one and half tablets PO  QDAY  . simvastatin (ZOCOR) 20 MG tablet TAKE 1 TABLET (20 MG TOTAL) BY MOUTH DAILY.  . [DISCONTINUED] fluticasone furoate-vilanterol (BREO ELLIPTA) 200-25 MCG/INH AEPB Inhale 1 puff into the lungs daily for 1 day.  . [DISCONTINUED] fluticasone furoate-vilanterol (BREO ELLIPTA) 200-25 MCG/INH AEPB Inhale 1 puff into the lungs daily for 1 day.   No facility-administered encounter medications on file as of 09/02/2017.     Allergies as of 09/02/2017 - Review Complete 09/02/2017  Allergen Reaction Noted  . Fluoxetine Other (See Comments) 11/27/2013  . Cymbalta [duloxetine hcl] Other (See  Comments) 05/05/2015  . Duloxetine    . Penicillins    . Prozac [fluoxetine hcl] Other (See Comments) 11/27/2013    Past Medical History:  Diagnosis Date  . ADD (attention deficit disorder)   . Anxiety   . Cardiomyopathy, peripartum, postpartum   . Carpal tunnel syndrome of right wrist   . CHF (congestive heart failure) ([place])   . Chronic systolic CHF (congestive heart failure), NYHA class 3 ([place]) 12/06/2013  . Depression   . Essential hypertension, benign   . Migraines   . Morbid obesity ([place])   . OSA (obstructive sleep apnea)   . Plantar fasciitis   . Spinal stenosis     Past Surgical History:  Procedure Laterality Date  . CHOLECYSTECTOMY    . RIGHT/LEFT HEART CATH AND CORONARY ANGIOGRAPHY N/A 04/11/2016   Procedure: Right/Left Heart Cath and Coronary Angiography;  Surgeon: Burnell Blanks, MD;  Location: [facility];  Service: Cardiovascular;  Laterality: N/A;    Family History  Problem Relation Age of Onset  . Depression Mother   . Anxiety disorder Mother   . Diabetes Mother   . Hypertension Mother   . ADD / ADHD Father   . Diabetes Father   . Hypertension Father   . Hypertension Other   . Diabetes Other   . Colitis Other   . Alcohol abuse Other     Social History   Socioeconomic History  . Marital status: Married    Spouse name: Not on file  . Number of children: Not on file  . Years of education: Not on file  . Highest education level: Not on file  Occupational History  . Not on file  Social Needs  . Financial resource strain: Not on file  . Food insecurity:    Worry: Not on file    Inability: Not on file  . Transportation needs:    Medical: Not on file    Non-medical: Not on file  Tobacco Use  . Smoking status: Former Smoker    Packs/day: 0.50    Types: Cigarettes    Last attempt to quit: 02/25/1997    Years since quitting: 20.5  . Smokeless tobacco: Never Used  . Tobacco comment: Married, lives with spouse (when he is not  traveling) and son  Substance and Sexual Activity  . Alcohol use: No  . Drug use: No  . Sexual activity: Yes    Partners: Male    Birth control/protection: None  Lifestyle  . Physical activity:    Days per week: Not on file    Minutes per session: Not on file  . Stress: Not on file  Relationships  . Social connections:    Talks on phone: Not on file    Gets together: Not on file    Attends religious service: Not on file    Active member of club or organization: Not on file  Attends meetings of clubs or organizations: Not on file    Relationship status: Not on file  . Intimate partner violence:    Fear of current or ex partner: Not on file    Emotionally abused: Not on file    Physically abused: Not on file    Forced sexual activity: Not on file  Other Topics Concern  . Not on file  Social History Narrative  . Not on file    Review of systems: Review of Systems  Constitutional: Negative for fever and chills.  HENT: Negative.   Eyes: Negative for blurred vision.  Respiratory: as per HPI  Cardiovascular: Negative for chest pain and palpitations.  Gastrointestinal: Negative for vomiting, diarrhea, blood per rectum. Genitourinary: Negative for dysuria, urgency, frequency and hematuria.  Musculoskeletal: Negative for myalgias, back pain and joint pain.  Skin: Negative for itching and rash.  Neurological: Negative for dizziness, tremors, focal weakness, seizures and loss of consciousness.  Endo/Heme/Allergies: Negative for environmental allergies.  Psychiatric/Behavioral: Negative for depression, suicidal ideas and hallucinations.  All other systems reviewed and are negative.  Physical Exam: Blood pressure 132/78, pulse 88, height 5\' 4"  (1.626 m), weight (!) 331 lb 6.4 oz (150.3 kg), SpO2 93 %. Gen:      No acute distress HEENT:  EOMI, sclera anicteric Neck:     No masses; no thyromegaly Lungs:    Clear to auscultation bilaterally; normal respiratory effort CV:          Regular rate and rhythm; no murmurs Abd:      + bowel sounds; soft, non-tender; no palpable masses, no distension Ext:    No edema; adequate peripheral perfusion Skin:      Warm and dry; no rash Neuro: alert and oriented x 3 Psych: normal mood and affect  Data Reviewed: CT chest abdomen pelvis 07/28/2017- normal lungs with no interstitial opacity.  No acute abnormality in the chest or abdomen pelvis.  I have reviewed the images personally.  Echocardiogram 08/22/2017-  Left ventricle: The cavity size was moderately dilated. Wall   thickness was increased in a pattern of mild LVH. Systolic   function was moderately reduced. The estimated ejection fraction   was in the range of 35% to 40%. Diffuse hypokinesis. Features are   consistent with a pseudonormal left ventricular filling pattern,   with concomitant abnormal relaxation and increased filling   pressure (grade 2 diastolic dysfunction).  FENO 09/02/2017-14  PFTs 07/09/2016 FVC 2.46 [71%], FEV1 2.06 (1 5%], F/F 84, TLC 76%, DLCO 72%,  DLCO/VA 99% No obstruction, minimal restriction, diffusion defect.  Assessment:  Evaluation for dyspnea PFTs reviewed with no significant obstruction.  There is mild restriction and diffusion impairment that corrects for alveolar volume.  These abnormalities are due to her body habitus given the reduction in RV.  There is no evidence of interstitial lung disease or lung abnormalities on recent CT of the chest  Suspect that her dyspnea is mainly secondary to a combination of deconditioning, body habitus, cardiomyopathy I will schedule her for a cardiopulmonary exercise test for better evaluation.  Try albuterol rescue inhaler as needed  Cardiomyopathy Follow up with cardiology.  I have asked her to contact her cardiologist office since they are trying to get an touch with her to get started on Entresto  OSA Encouraged her to start using the CPAP.  She follows up with heart failure clinic for management of  CPAP.  Plan/Recommendations: - Cardiopulmonary exercise test - Albuterol as needed. - Encouraged weight loss  and exercise.  Marshell Garfinkel MD [facility] 09/02/2017, 9:31 AM  CC: Briscoe Deutscher, DO

## 2017-09-02 NOTE — Patient Instructions (Signed)
We will give you an albuterol inhaler to help with the symptoms of dyspnea.  Use it as needed up to 4 times a day We will schedule you for a cardiopulmonary exercise test to further evaluate the reason for your shortness of breath Please continue to work on weight loss   Make sure you call your cardiologist as they are trying to get in touch with you regarding your medications Follow-up in 1 to 2 months.

## 2017-09-03 ENCOUNTER — Telehealth (HOSPITAL_COMMUNITY): Payer: Self-pay

## 2017-09-03 NOTE — Telephone Encounter (Signed)
Appointment - Left a message after speaking with patient 09/02/17 to reschedule for Dr. Parke Poisson that day. Requested patient call back to confirm offered time to see him on 09/10/17 at 9 or 10 am.

## 2017-09-08 ENCOUNTER — Other Ambulatory Visit: Payer: Self-pay | Admitting: Family Medicine

## 2017-09-08 NOTE — Telephone Encounter (Signed)
Ok to refill 

## 2017-09-08 NOTE — Progress Notes (Signed)
52 y.o.  [id number]. MarriedCaucasianF referred by Dr Juleen China for evaluation of abnormal uterine bleeding. She is bleeding every 3-4 months x 5-6 days, very heavy. At the heaviest she is going through a super tampon every 15 minutes, now using poise tampons (for leakage) because her bleeding so heavily. Occasionally gets light headed.  Last period was last month.  Long term night sweats (since she was a teen). No hot flashes.  Sexually active (but not for many months), no contraception. Occasional deep dyspareunia.   She has a 9 year old son, she developed cardiomyopathy during that pregnancy. He has Asperger's and ADD. She was advised to terminate her next pregnancy secondary to her heart issues.    Recent normal TSH, not anemic in 5/19.   No LMP recorded. (Menstrual status: Irregular Periods).          Sexually active: Yes.    The current method of family planning is none.    Exercising: No.   Smoker:no  Health Maintenance: Pap:   2014 History of abnormal Pap: no MMG:2014 Colonoscopy: 2019 BMD:  none  TDaP: unsure  Gardasil: none   reports that she quit smoking about 20 years ago. Her smoking use included cigarettes. She smoked 0.50 packs per day. She has never used smokeless tobacco. She reports that she does not drink alcohol or use drugs. On disability secondary to Cardiomyopathy and back issues.   Past Medical History:  Diagnosis Date  . ADD (attention deficit disorder)   . Anxiety   . Cardiomyopathy, peripartum, postpartum   . Carpal tunnel syndrome of right wrist   . CHF (congestive heart failure) ([place])   . Chronic systolic CHF (congestive heart failure), NYHA class 3 ([place]) 12/06/2013  . Depression   . Essential hypertension, benign   . Migraines   . Morbid obesity ([place])   . OSA (obstructive sleep apnea)   . Plantar fasciitis   . Spinal stenosis     Past Surgical History:  Procedure Laterality Date  . CHOLECYSTECTOMY    . RIGHT/LEFT HEART CATH AND CORONARY  ANGIOGRAPHY N/A 04/11/2016   Procedure: Right/Left Heart Cath and Coronary Angiography;  Surgeon: Burnell Blanks, MD;  Location: [facility];  Service: Cardiovascular;  Laterality: N/A;    Current Outpatient Medications  Medication Sig Dispense Refill  . amLODipine (NORVASC) 10 MG tablet TAKE 1 TABLET BY MOUTH EVERY DAY 90 tablet 2  . busPIRone (BUSPAR) 10 MG tablet Take 1 tablet (10 mg total) by mouth 2 (two) times daily. 180 tablet 0  . carvedilol (COREG) 25 MG tablet Take 1 tablet (25 mg total) by mouth 2 (two) times daily with a meal. 180 tablet 3  . clonazePAM (KLONOPIN) 1 MG tablet Take .5 mg qd prn anxiety 30 tablet 1  . clotrimazole-betamethasone (LOTRISONE) cream Apply 1 application topically 2 (two) times daily. 45 g 2  . COREG 12.5 MG tablet Take 1 tablet (12.5 mg total) by mouth 2 (two) times daily. 180 tablet 3  . dicyclomine (BENTYL) 20 MG tablet Take 1 tablet (20 mg total) by mouth every 6 (six) hours. 30 tablet 0  . enalapril (VASOTEC) 20 MG tablet TAKE 1 TABLET (20 MG TOTAL) BY MOUTH 2 (TWO) TIMES DAILY. 180 tablet 2  . furosemide (LASIX) 20 MG tablet Two tablet by mouth twice a day 360 tablet 1  . gabapentin (NEURONTIN) 100 MG capsule TAKE 2 CAPSULES BY MOUTH 3 TIMES DAILY. 270 capsule 0  . ibuprofen (ADVIL,MOTRIN) 800 MG tablet Take  1 tablet (800 mg total) by mouth every 8 (eight) hours as needed for headache or mild pain. 30 tablet 1  . lamoTRIgine (LAMICTAL) 200 MG tablet Take 1 tablet (200 mg total) by mouth daily. 30 tablet 0  . potassium chloride SA (KLOR-CON M20) 20 MEQ tablet Take 1 tablet (20 mEq total) by mouth 2 (two) times daily. 180 tablet 3  . Psyllium (NAT-RUL PSYLLIUM SEED HUSKS) 500 MG CAPS 1-2 po qid as needed 240 each 2  . sertraline (ZOLOFT) 100 MG tablet Take one and half tablets PO QDAY 90 tablet 0  . simvastatin (ZOCOR) 20 MG tablet TAKE 1 TABLET (20 MG TOTAL) BY MOUTH DAILY. 90 tablet 2  . albuterol (PROVENTIL HFA;VENTOLIN HFA) 108 (90  Base) MCG/ACT inhaler Inhale 2 puffs into the lungs every 6 (six) hours as needed for wheezing or shortness of breath. (Patient not taking: Reported on 09/09/2017) 1 Inhaler 2  . HYDROcodone-acetaminophen (NORCO/VICODIN) 5-325 MG tablet Take 1 tablet by mouth every 6 (six) hours as needed for moderate pain. (Patient not taking: Reported on 09/09/2017) 20 tablet 0   No current facility-administered medications for this visit.     Family History  Problem Relation Age of Onset  . Depression Mother   . Anxiety disorder Mother   . Diabetes Mother   . Hypertension Mother   . ADD / ADHD Father   . Diabetes Father   . Hypertension Father   . Hypertension Other   . Diabetes Other   . Colitis Other   . Alcohol abuse Other     Review of Systems  Constitutional: Negative.   HENT: Negative.   Eyes: Negative.   Respiratory: Negative.   Cardiovascular: Negative.   Gastrointestinal: Negative.   Endocrine: Negative.   Genitourinary:       Menstrual cramps Fibroids per patient Urinary leakage  Musculoskeletal: Negative.   Skin: Negative.   Allergic/Immunologic: Negative.   Neurological: Negative.   Hematological: Negative.   Psychiatric/Behavioral:       Anxiety depression    Exam:   BP 134/83 (BP Location: Left Arm, Patient Position: Sitting, Cuff Size: Large)   Pulse 88   Ht 5\' 4"  (1.626 m)   Wt (!) 338 lb (153.3 kg)   BMI 58.02 kg/m   Weight change: @WEIGHTCHANGE @ Height:   Height: 5\' 4"  (162.6 cm)  Ht Readings from Last 3 Encounters:  09/09/17 5\' 4"  (1.626 m)  09/02/17 5\' 4"  (1.626 m)  08/19/17 5\' 4"  (1.626 m)    General appearance: alert, cooperative and appears stated age Head: Normocephalic, without obvious abnormality, atraumatic Neck: no adenopathy, supple, symmetrical, trachea midline and thyroid normal to inspection and palpation Lungs: clear to auscultation bilaterally Cardiovascular: regular rate and rhythm Breasts: normal appearance, no masses or  tenderness Abdomen: soft, non-tender; non distended,  no masses,  no organomegaly Extremities: extremities normal, atraumatic, no cyanosis or edema Skin: Skin color, texture, turgor normal. No rashes or lesions Lymph nodes: Cervical, supraclavicular, and axillary nodes normal. No abnormal inguinal nodes palpated Neurologic: Grossly normal   Pelvic: External genitalia:  no lesions              Urethra:  normal appearing urethra with no masses, tenderness or lesions              Bartholins and Skenes: normal                 Vagina: normal appearing vagina with normal color and discharge, no lesions  Cervix: polyp noted, removed with ringed forceps               Bimanual Exam:  Uterus:  exam limitied by BMI, but no clear masses              Adnexa: no mass, fullness, tenderness               Rectovaginal: Confirms               Anus:  normal sphincter tone, no lesions  Chaperone was present for exam.  A:  Oligomenorrhea, likely perimenopausal, normal TSH  Abnormal uterine bleeding, at risk of hyperplasia. Normal TSH and CBC in 5/19  Obesity  Multiple medical issues   P:   Pap with hpv   UPT negative  Return for ultrasound, possible sonohysterogram, possible endometrial biopsy  Suspect she may get anemic after an episode of heavy bleeding.   CC: Dr Juleen China

## 2017-09-09 ENCOUNTER — Other Ambulatory Visit (HOSPITAL_COMMUNITY)
Admission: RE | Admit: 2017-09-09 | Discharge: 2017-09-09 | Disposition: A | Discharge status: Home / Self Care | Payer: Medicare Other | Source: Ambulatory Visit | Attending: Obstetrics and Gynecology | Admitting: Obstetrics and Gynecology

## 2017-09-09 ENCOUNTER — Encounter: Payer: Self-pay | Admitting: Obstetrics and Gynecology

## 2017-09-09 ENCOUNTER — Ambulatory Visit: Payer: Medicare Other | Admitting: Obstetrics and Gynecology

## 2017-09-09 VITALS — BP 134/83 | HR 88 | Ht 64.0 in | Wt 338.0 lb

## 2017-09-09 DIAGNOSIS — Z6841 Body Mass Index (BMI) 40.0 and over, adult: Secondary | ICD-10-CM

## 2017-09-09 DIAGNOSIS — R32 Unspecified urinary incontinence: Secondary | ICD-10-CM

## 2017-09-09 DIAGNOSIS — N921 Excessive and frequent menstruation with irregular cycle: Secondary | ICD-10-CM | POA: Diagnosis not present

## 2017-09-09 DIAGNOSIS — N914 Secondary oligomenorrhea: Secondary | ICD-10-CM

## 2017-09-09 DIAGNOSIS — N841 Polyp of cervix uteri: Secondary | ICD-10-CM

## 2017-09-09 LAB — POCT URINE PREGNANCY: Preg Test, Ur: NEGATIVE

## 2017-09-09 LAB — POCT URINALYSIS DIPSTICK
Bilirubin, UA: NEGATIVE
Blood, UA: NEGATIVE
Glucose, UA: NEGATIVE
Ketones, UA: NEGATIVE
Leukocytes, UA: NEGATIVE
Nitrite, UA: NEGATIVE
Protein, UA: NEGATIVE
Spec Grav, UA: 1.01 (ref 1.010–1.025)
Urobilinogen, UA: 0.2 E.U./dL
pH, UA: 5 (ref 5.0–8.0)

## 2017-09-11 ENCOUNTER — Telehealth: Payer: Self-pay | Admitting: Obstetrics and Gynecology

## 2017-09-11 LAB — CYTOLOGY - PAP
Diagnosis: NEGATIVE
HPV: NOT DETECTED

## 2017-09-11 NOTE — Telephone Encounter (Signed)
Call returned to patient to schedule recommended ultrasound with possible sonohysterogram and possible endometrial biopsy. Patient advises her last monthly period was approximately 08/19/17. Explained to patient if needs to proceed with the additional procedures, we may need to scheduling around her cycle.   Patient has additional questions as to what to expect with these procedures. Patient states she had some polyps removed at appointment on 09/09/17 and said she is still having some bleeding and burning and wants to know how the recommended procedures compare to the polyp removal.  Routing to Triage Nurse for review

## 2017-09-11 NOTE — Telephone Encounter (Signed)
Patient left message returning call to [facility]. Ph: [phone]

## 2017-09-12 NOTE — Telephone Encounter (Signed)
Left message to call Krystine Pabst at [phone].  

## 2017-09-15 ENCOUNTER — Encounter: Payer: Self-pay | Admitting: *Deleted

## 2017-09-16 NOTE — Telephone Encounter (Signed)
Left message to call Brasen Bundren at [phone].  

## 2017-09-16 NOTE — Telephone Encounter (Signed)
Spoke with patient, questions answered regarding PUS, possible SHGM and EMB. Patient denies any current bleeding, vaginal odor or d/c. Patient reports "burning where polyp was removed, not really sure of location". Recommended patient keep PUS as scheduled for 09/30/17, return call to office if any new symptoms develop.   Routing to provider for final review. Patient is agreeable to disposition. Will close encounter.   Cc: Lerry Liner

## 2017-09-18 ENCOUNTER — Ambulatory Visit (HOSPITAL_COMMUNITY): Payer: Medicare Other | Admitting: Psychiatry

## 2017-09-18 ENCOUNTER — Encounter (HOSPITAL_COMMUNITY): Payer: Self-pay | Admitting: Psychiatry

## 2017-09-18 DIAGNOSIS — F331 Major depressive disorder, recurrent, moderate: Secondary | ICD-10-CM

## 2017-09-18 MED ORDER — LAMOTRIGINE 200 MG PO TABS: 200.0000 mg | ORAL_TABLET | Freq: Every day | ORAL | 1 refills | Status: DC

## 2017-09-18 MED ORDER — BUSPIRONE HCL 10 MG PO TABS: 10.0000 mg | ORAL_TABLET | Freq: Two times a day (BID) | ORAL | 1 refills | Status: DC

## 2017-09-18 MED ORDER — CLONAZEPAM 1 MG PO TABS: ORAL_TABLET | ORAL | 1 refills | Status: DC

## 2017-09-18 MED ORDER — SERTRALINE HCL 100 MG PO TABS: ORAL_TABLET | ORAL | 1 refills | Status: DC

## 2017-09-18 NOTE — Progress Notes (Unsigned)
BH MD/PA/NP OP Progress Note  09/18/2017 12:22 PM [name]  MRN:  [id number]  Chief Complaint: history of depresssion HPI: returns for medication management  Patient is a 52 year old married female, lives with husband and a 7 year old son. Unemployed . She had not returned for follow up in several months, but had contacted me via phone expressing interest in resuming regular appointments. She had spent several weeks/months in [place] due to her mother's failing health. Mother passed away recently.  At this time patient reports some persistent depression, anxiety , which she currently attributes mostly to concerns about her physical health and chronic medical issues . She reports she has exertion dyspnea, for which she has been followed by her Cardiologist and her Pulmonologist. Further work up in progress, and states she is scheduled for stress test . She has a history of cardiomyopathy, morbid obesity, Obstructive Sleep Apnea. She also describes deconditioning due to difficulty with moving or exercising . Due to above reports she has been feeling depressed, discouraged. Denies suicidal ideations, no psychotic symptoms. Describes mild anhedonia, but states she enjoys spending time with husband, son, and is future oriented, for example spoke about her plans to dye her hair in another color and about wanting to spend more time with her son during his Summer vacation. States she has remained compliant with psychiatric medications, denies side effects. Reports she has continued to take Lamictal regularly .  Denies side effects.  Visit Diagnosis:    ICD-10-CM   1. Moderate episode of recurrent major depressive disorder (HCC) F33.1 lamoTRIgine (LAMICTAL) 200 MG tablet    Past Psychiatric History: depression  Past Medical History:  Past Medical History:  Diagnosis Date  . ADD (attention deficit disorder)   . Anxiety   . Cardiomyopathy, peripartum, postpartum   . Carpal tunnel syndrome of right  wrist   . CHF (congestive heart failure) ([place])   . Chronic systolic CHF (congestive heart failure), NYHA class 3 ([place]) 12/06/2013  . Depression   . Essential hypertension, benign   . Migraines   . Morbid obesity ([place])   . OSA (obstructive sleep apnea)   . Plantar fasciitis   . Spinal stenosis     Past Surgical History:  Procedure Laterality Date  . CHOLECYSTECTOMY    . RIGHT/LEFT HEART CATH AND CORONARY ANGIOGRAPHY N/A 04/11/2016   Procedure: Right/Left Heart Cath and Coronary Angiography;  Surgeon: Burnell Blanks, MD;  Location: [facility];  Service: Cardiovascular;  Laterality: N/A;    Family Psychiatric History:   Family History:  Family History  Problem Relation Age of Onset  . Depression Mother   . Anxiety disorder Mother   . Diabetes Mother   . Hypertension Mother   . ADD / ADHD Father   . Diabetes Father   . Hypertension Father   . Hypertension Other   . Diabetes Other   . Colitis Other   . Alcohol abuse Other     Social History:  Social History   Socioeconomic History  . Marital status: Married    Spouse name: Not on file  . Number of children: Not on file  . Years of education: Not on file  . Highest education level: Not on file  Occupational History  . Not on file  Social Needs  . Financial resource strain: Not on file  . Food insecurity:    Worry: Not on file    Inability: Not on file  . Transportation needs:  Medical: Not on file    Non-medical: Not on file  Tobacco Use  . Smoking status: Former Smoker    Packs/day: 0.50    Types: Cigarettes    Last attempt to quit: 02/25/1997    Years since quitting: 20.5  . Smokeless tobacco: Never Used  . Tobacco comment: Married, lives with spouse (when he is not traveling) and son  Substance and Sexual Activity  . Alcohol use: No  . Drug use: No  . Sexual activity: Yes    Partners: Male    Birth control/protection: None  Lifestyle  . Physical activity:    Days per week: Not on file     Minutes per session: Not on file  . Stress: Not on file  Relationships  . Social connections:    Talks on phone: Not on file    Gets together: Not on file    Attends religious service: Not on file    Active member of club or organization: Not on file    Attends meetings of clubs or organizations: Not on file    Relationship status: Not on file  Other Topics Concern  . Not on file  Social History Narrative  . Not on file    Allergies:  Allergies  Allergen Reactions  . Fluoxetine Other (See Comments)    More depressed  . Cymbalta [Duloxetine Hcl] Other (See Comments)    depressed  . Duloxetine     REACTION: Sucidal thoughts  . Penicillins     REACTION: rash Has patient had a PCN reaction causing immediate rash, facial/tongue/throat swelling, SOB or lightheadedness with hypotension: YES Has patient had a PCN reaction causing severe rash involving mucus membranes or skin necrosis: NO Has patient had a PCN reaction that required hospitalization: YES Has patient had a PCN reaction occurring within the last 10 years: NO If all of the above answers are "NO", then may proceed with Cephalosporin use.   . Prozac [Fluoxetine Hcl] Other (See Comments)    More depressed    Metabolic Disorder Labs: Lab Results  Component Value Date   HGBA1C 6.0 02/12/2017   MPG 125.5 10/19/2016   MPG 125.5 10/19/2016   No results found for: PROLACTIN Lab Results  Component Value Date   CHOL 204 (H) 07/18/2016   TRIG 123.0 07/18/2016   HDL 51.10 07/18/2016   CHOLHDL 4 07/18/2016   VLDL 24.6 07/18/2016   LDLCALC 128 (H) 07/18/2016   LDLCALC 78 11/08/2013   Lab Results  Component Value Date   TSH 4.18 07/14/2017   TSH 0.804 10/19/2016    Therapeutic Level Labs: No results found for: LITHIUM No results found for: VALPROATE No components found for:  CBMZ  Current Medications: Current Outpatient Medications  Medication Sig Dispense Refill  . amLODipine (NORVASC) 10 MG tablet TAKE 1  TABLET BY MOUTH EVERY DAY 90 tablet 2  . busPIRone (BUSPAR) 10 MG tablet Take 1 tablet (10 mg total) by mouth 2 (two) times daily. 60 tablet 1  . carvedilol (COREG) 25 MG tablet Take 1 tablet (25 mg total) by mouth 2 (two) times daily with a meal. 180 tablet 3  . clonazePAM (KLONOPIN) 1 MG tablet Take .5 mg qd prn anxiety 21 tablet 1  . clotrimazole-betamethasone (LOTRISONE) cream Apply 1 application topically 2 (two) times daily. 45 g 2  . COREG 12.5 MG tablet Take 1 tablet (12.5 mg total) by mouth 2 (two) times daily. 180 tablet 3  . dicyclomine (BENTYL) 20 MG tablet Take 1  tablet (20 mg total) by mouth every 6 (six) hours. 30 tablet 0  . enalapril (VASOTEC) 20 MG tablet TAKE 1 TABLET (20 MG TOTAL) BY MOUTH 2 (TWO) TIMES DAILY. 180 tablet 2  . furosemide (LASIX) 20 MG tablet Two tablet by mouth twice a day 360 tablet 1  . gabapentin (NEURONTIN) 100 MG capsule TAKE 2 CAPSULES BY MOUTH 3 TIMES DAILY. 270 capsule 0  . ibuprofen (ADVIL,MOTRIN) 800 MG tablet Take 1 tablet (800 mg total) by mouth every 8 (eight) hours as needed for headache or mild pain. 30 tablet 1  . lamoTRIgine (LAMICTAL) 200 MG tablet Take 1 tablet (200 mg total) by mouth daily. 30 tablet 1  . potassium chloride SA (KLOR-CON M20) 20 MEQ tablet Take 1 tablet (20 mEq total) by mouth 2 (two) times daily. 180 tablet 3  . Psyllium (NAT-RUL PSYLLIUM SEED HUSKS) 500 MG CAPS 1-2 po qid as needed 240 each 2  . sertraline (ZOLOFT) 100 MG tablet Take two tablets PO QDAY 60 tablet 1  . simvastatin (ZOCOR) 20 MG tablet TAKE 1 TABLET (20 MG TOTAL) BY MOUTH DAILY. 90 tablet 2  . albuterol (PROVENTIL HFA;VENTOLIN HFA) 108 (90 Base) MCG/ACT inhaler Inhale 2 puffs into the lungs every 6 (six) hours as needed for wheezing or shortness of breath. (Patient not taking: Reported on 09/09/2017) 1 Inhaler 2  . HYDROcodone-acetaminophen (NORCO/VICODIN) 5-325 MG tablet Take 1 tablet by mouth every 6 (six) hours as needed for moderate pain. (Patient not taking:  Reported on 09/09/2017) 20 tablet 0   No current facility-administered medications for this visit.      Musculoskeletal: Strength & Muscle Tone: within normal limits Gait & Station: normal Patient leans: N/A  Psychiatric Specialty Exam: ROS reports dyspnea on exertion, denies chest pain, describes intermittent epigastric discomfort, no rash    Blood pressure 111/65, pulse 88, height 5' 3.5" (1.613 m), weight (!) 149.7 kg (330 lb).Body mass index is 57.54 kg/m.  General Appearance: Well Groomed  Eye Contact:  Good  Speech:  Normal Rate  Volume:  Normal  Mood:  reports she feels depressed, which she attributes to stressors as above  Affect:  somewhat constricted, but reactive, smiles and even laughs appropriately during session  Thought Process:  Linear and Descriptions of Associations: Intact  Orientation:  Full (Time, Place, and Person)  Thought Content: no hallucinations, no delusions, not internally preoccupied    Suicidal Thoughts:  No denies suicidal or self injurious ideations  Homicidal Thoughts:  No  Memory:  recent and remote grossly intact   Judgement:  Other:  present  Insight:  Present  Psychomotor Activity:  Decreased  Concentration:  Concentration: Good and Attention Span: Good  Recall:  Good  Fund of Knowledge: Good  Language: Good  Akathisia:  Negative  Handed:  Right  AIMS (if indicated): not done  Assets:  Desire for Improvement Resilience  ADL's:  Intact  Cognition: WNL  Sleep:  Good   Screenings: AUDIT     Admission (Discharged) from 12/05/2013 in [facility]  Alcohol Use Disorder Identification Test Final Score (AUDIT)  0    PHQ2-9     Office Visit from 10/18/2016 in [facility] from 06/26/2016 in [facility] from 07/19/2015 in [facility] Visit from 07/05/2015 in [facility] at  [facility]  PHQ-2 Total Score  5  4  2   0  PHQ-9 Total Score  8  21  5   -       Assessment and Plan: 52 year old female, history of depression, presents for follow up appointment. Describes feeling depressed related to medical issues and to loss of her mother earlier this year. Affect is fully reactive at this time. Denies suicidal ideations and is future oriented . She reports history of dyspnea on exertion which has limited her ability to function on daily basis- she is being followed by both a Cardiologist and a Pulmonologist for this condition. She reports that due to her weight it is difficult for her to exercise, resulting in increased weight and deconditioning . We have reviewed the likely benefits of gradual weight loss, and I have encouraged her to ask her Cardiologist and PCP about whether bariatric surgery could be a possible option for her.  Insofar as psychiatric medications, she states she is taking them regularly,  tolerating them well, feels they are at least partially helpful.  Renew Lamcital 200 mgrs QDAY, Klonopin now decreased to 0.5 mgrs QDAY PRN for anxiety, Zoloft, now increased to 200 mgrs QDAY , Buspar at 10 mgrs BID. She is aware of side effects, including risk of severe rash on Lamictal.  Will see in 3-4 weeks , agrees to contact me sooner if any worsening prior . I have recommended she also start oupatient individual psychotherapy in addition to psychiatric medication management.   Jenne Campus, MD 09/18/2017, 12:22 PM

## 2017-09-19 ENCOUNTER — Other Ambulatory Visit: Payer: Self-pay | Admitting: Obstetrics and Gynecology

## 2017-09-19 ENCOUNTER — Telehealth (HOSPITAL_COMMUNITY): Payer: Self-pay | Admitting: Cardiology

## 2017-09-19 DIAGNOSIS — Z1231 Encounter for screening mammogram for malignant neoplasm of breast: Secondary | ICD-10-CM

## 2017-09-19 NOTE — Telephone Encounter (Signed)
Spoke with patient regarding echo results. Patient stated that she was very upset after not having the results, and being told we had tried to contact her after she has never received any phone calls. I advised patient I was sorry to hear that she was having all of those issues, but I was glad to go over the results with her. Patient was given echo results, set up with an appointment to see Almyra Deforest, PA to discuss the medication that Dr.Hochrein advised she should start.   Patient had no questions or concerns.

## 2017-09-19 NOTE — Telephone Encounter (Signed)
Pt called for her ECHO results 6/28 please give her a call.

## 2017-09-19 NOTE — Telephone Encounter (Signed)
Follow up  ° ° °Patient is returning call in reference to echocardiogram.  °

## 2017-09-23 ENCOUNTER — Ambulatory Visit (HOSPITAL_COMMUNITY): Payer: Medicare Other | Attending: Pulmonary Disease

## 2017-09-23 DIAGNOSIS — R0602 Shortness of breath: Secondary | ICD-10-CM | POA: Insufficient documentation

## 2017-09-24 ENCOUNTER — Ambulatory Visit (HOSPITAL_COMMUNITY): Payer: Self-pay | Admitting: Psychiatry

## 2017-09-29 ENCOUNTER — Telehealth: Payer: Self-pay | Admitting: Obstetrics and Gynecology

## 2017-09-29 NOTE — Telephone Encounter (Signed)
Call from [place], [place] [employer] at approximately 1215.  Patient is safe and not in danger.   Routing to Dr Talbert Nan for review. Encounter closed.

## 2017-09-29 NOTE — Telephone Encounter (Signed)
Patient called in and stated she needed to cancel her appointment for her ultrasound procedure scheduled for 10/11/17 with Dr. Talbert Nan as her car was in the shop and she did not have transportation. Patient's speech was slurred and she stated she was depressed and "about to jump out the window". Call was disconnected, I immediately notified Lamont Snowball, RN to call patient back.

## 2017-09-29 NOTE — Telephone Encounter (Addendum)
Call to patient approximately 1125 following patient call to our office.  States " I am not doing good."  She is at home with 52 year old son. Sounds groggy and has slurred speech. Voicing frustration over health, car and financial issues. Husband out of country and delayed return is adding to her stress. States "so overwhelmed."  Asked if she had taken any medication. States has only taken her regular medication. Can take extra Clonazepam but denies having taken any extra today. Denies thoughts to harm self or others. States " I dont know what she thinks I said."  Agrees to Call Dr Sindy Messing. Has her son with her and is able to reach out to neighbors for help if needed.    Olivia Mackie Fast RN present for 10 minute call.  Call to non-emergent [facility]. Requested wellness check on patient.

## 2017-09-29 NOTE — Progress Notes (Deleted)
GYNECOLOGY  VISIT   HPI: 52 y.o.   Married  Caucasian  female   [id number] with No LMP recorded. (Menstrual status: Irregular Periods).   here for consult after PUS and EMB.  GYNECOLOGIC HISTORY: No LMP recorded. (Menstrual status: Irregular Periods). Contraception:None Menopausal hormone therapy: None        OB History    Gravida  2   Para  1   Term  1   Preterm      AB  1   Living  1     SAB      TAB  1   Ectopic      Multiple      Live Births                 Patient Active Problem List   Diagnosis Date Noted  . Acute on chronic systolic CHF (congestive heart failure) ([place]) 10/19/2016  . Hyperglycemia 10/19/2016  . SOB (shortness of breath) 06/22/2016  . Smoker 06/22/2016  . Normal coronary arteries 04/17/2016  . Obstructive sleep apnea syndrome 10/28/2014  . Major depressive disorder 10/28/2014  . Chronic systolic CHF (congestive heart failure), NYHA class 3 ([place]) 12/06/2013  . Dyslipidemia 04/04/2011  . Hypertension 06/03/2008  . Morbid obesity ([place]) 07/12/2006  . NICM (nonischemic cardiomyopathy) ([place]) 07/12/2006  . Attention deficit disorder 07/12/2006    Past Medical History:  Diagnosis Date  . ADD (attention deficit disorder)   . Anxiety   . Cardiomyopathy, peripartum, postpartum   . Carpal tunnel syndrome of right wrist   . CHF (congestive heart failure) ([place])   . Chronic systolic CHF (congestive heart failure), NYHA class 3 ([place]) 12/06/2013  . Depression   . Essential hypertension, benign   . Migraines   . Morbid obesity ([place])   . OSA (obstructive sleep apnea)   . Plantar fasciitis   . Spinal stenosis     Past Surgical History:  Procedure Laterality Date  . CHOLECYSTECTOMY    . RIGHT/LEFT HEART CATH AND CORONARY ANGIOGRAPHY N/A 04/11/2016   Procedure: Right/Left Heart Cath and Coronary Angiography;  Surgeon: Burnell Blanks, MD;  Location: [facility];  Service: Cardiovascular;  Laterality: N/A;    Current  Outpatient Medications  Medication Sig Dispense Refill  . albuterol (PROVENTIL HFA;VENTOLIN HFA) 108 (90 Base) MCG/ACT inhaler Inhale 2 puffs into the lungs every 6 (six) hours as needed for wheezing or shortness of breath. (Patient not taking: Reported on 09/09/2017) 1 Inhaler 2  . amLODipine (NORVASC) 10 MG tablet TAKE 1 TABLET BY MOUTH EVERY DAY 90 tablet 2  . busPIRone (BUSPAR) 10 MG tablet Take 1 tablet (10 mg total) by mouth 2 (two) times daily. 60 tablet 1  . carvedilol (COREG) 25 MG tablet Take 1 tablet (25 mg total) by mouth 2 (two) times daily with a meal. 180 tablet 3  . clonazePAM (KLONOPIN) 1 MG tablet Take .5 mg qd prn anxiety 21 tablet 1  . clotrimazole-betamethasone (LOTRISONE) cream Apply 1 application topically 2 (two) times daily. 45 g 2  . COREG 12.5 MG tablet Take 1 tablet (12.5 mg total) by mouth 2 (two) times daily. 180 tablet 3  . dicyclomine (BENTYL) 20 MG tablet Take 1 tablet (20 mg total) by mouth every 6 (six) hours. 30 tablet 0  . enalapril (VASOTEC) 20 MG tablet TAKE 1 TABLET (20 MG TOTAL) BY MOUTH 2 (TWO) TIMES DAILY. 180 tablet 2  . furosemide (LASIX) 20 MG tablet Two tablet by mouth twice a  day 360 tablet 1  . gabapentin (NEURONTIN) 100 MG capsule TAKE 2 CAPSULES BY MOUTH 3 TIMES DAILY. 270 capsule 0  . HYDROcodone-acetaminophen (NORCO/VICODIN) 5-325 MG tablet Take 1 tablet by mouth every 6 (six) hours as needed for moderate pain. (Patient not taking: Reported on 09/09/2017) 20 tablet 0  . ibuprofen (ADVIL,MOTRIN) 800 MG tablet Take 1 tablet (800 mg total) by mouth every 8 (eight) hours as needed for headache or mild pain. 30 tablet 1  . lamoTRIgine (LAMICTAL) 200 MG tablet Take 1 tablet (200 mg total) by mouth daily. 30 tablet 1  . potassium chloride SA (KLOR-CON M20) 20 MEQ tablet Take 1 tablet (20 mEq total) by mouth 2 (two) times daily. 180 tablet 3  . Psyllium (NAT-RUL PSYLLIUM SEED HUSKS) 500 MG CAPS 1-2 po qid as needed 240 each 2  . sertraline (ZOLOFT) 100 MG  tablet Take two tablets PO QDAY 60 tablet 1  . simvastatin (ZOCOR) 20 MG tablet TAKE 1 TABLET (20 MG TOTAL) BY MOUTH DAILY. 90 tablet 2   No current facility-administered medications for this visit.      ALLERGIES: Fluoxetine; Cymbalta [duloxetine hcl]; Duloxetine; Penicillins; and Prozac [fluoxetine hcl]  Family History  Problem Relation Age of Onset  . Depression Mother   . Anxiety disorder Mother   . Diabetes Mother   . Hypertension Mother   . ADD / ADHD Father   . Diabetes Father   . Hypertension Father   . Hypertension Other   . Diabetes Other   . Colitis Other   . Alcohol abuse Other     Social History   Socioeconomic History  . Marital status: Married    Spouse name: Not on file  . Number of children: Not on file  . Years of education: Not on file  . Highest education level: Not on file  Occupational History  . Not on file  Social Needs  . Financial resource strain: Not on file  . Food insecurity:    Worry: Not on file    Inability: Not on file  . Transportation needs:    Medical: Not on file    Non-medical: Not on file  Tobacco Use  . Smoking status: Former Smoker    Packs/day: 0.50    Types: Cigarettes    Last attempt to quit: 02/25/1997    Years since quitting: 20.6  . Smokeless tobacco: Never Used  . Tobacco comment: Married, lives with spouse (when he is not traveling) and son  Substance and Sexual Activity  . Alcohol use: No  . Drug use: No  . Sexual activity: Yes    Partners: Male    Birth control/protection: None  Lifestyle  . Physical activity:    Days per week: Not on file    Minutes per session: Not on file  . Stress: Not on file  Relationships  . Social connections:    Talks on phone: Not on file    Gets together: Not on file    Attends religious service: Not on file    Active member of club or organization: Not on file    Attends meetings of clubs or organizations: Not on file    Relationship status: Not on file  . Intimate partner  violence:    Fear of current or ex partner: Not on file    Emotionally abused: Not on file    Physically abused: Not on file    Forced sexual activity: Not on file  Other Topics Concern  .  Not on file  Social History Narrative  . Not on file    ROS  PHYSICAL EXAMINATION:    There were no vitals taken for this visit.    General appearance: alert, cooperative and appears stated age Neck: no adenopathy, supple, symmetrical, trachea midline and thyroid {[facility] AMB PHY EX THYROID NORM DEFAULT:[phone]::"normal to inspection and palpation"} Breasts: {Exam; breast:13139::"normal appearance, no masses or tenderness"} Abdomen: soft, non-tender; non distended, no masses,  no organomegaly  Pelvic: External genitalia:  no lesions              Urethra:  normal appearing urethra with no masses, tenderness or lesions              Bartholins and Skenes: normal                 Vagina: normal appearing vagina with normal color and discharge, no lesions              Cervix: {CH[facility] AMB PHY EX CERVIX NORM DEFAULT:[phone]::"no lesions"}              Bimanual Exam:  Uterus:  {CH[facility] AMB PHY EX UTERUS NORM [phone]::"normal size, contour, position, consistency, mobility, non-tender"}              Adnexa: {CHL AMB PHY EX ADNEXA NO MASS DEFAULT:[phone]::"no mass, fullness, tenderness"}              Rectovaginal: {yes no:[id number]}.  Confirms.              Anus:  normal sphincter tone, no lesions  Chaperone was present for exam.  ASSESSMENT     PLAN    An After Visit Summary was printed and given to the patient.  *** minutes face to face time of which over 50% was spent in counseling.

## 2017-09-30 ENCOUNTER — Other Ambulatory Visit: Payer: Medicare Other

## 2017-09-30 ENCOUNTER — Other Ambulatory Visit: Payer: Medicare Other | Admitting: Obstetrics and Gynecology

## 2017-10-01 ENCOUNTER — Other Ambulatory Visit: Payer: Self-pay | Admitting: Cardiology

## 2017-10-01 ENCOUNTER — Other Ambulatory Visit: Payer: Self-pay | Admitting: Family Medicine

## 2017-10-01 NOTE — Telephone Encounter (Signed)
Rx sent to pharmacy   

## 2017-10-03 ENCOUNTER — Telehealth: Payer: Self-pay | Admitting: *Deleted

## 2017-10-03 NOTE — Telephone Encounter (Signed)
Returned a call to patient. She called to say that she hasn't heard from me nor her MDE company to get new supplies. She states that I told her that I was going to get her new supplies. I told her that I did not say this to her. Per our last conversation I stated to her that she will need to have appointment to see Dr Claiborne Billings. Also he adjusted her CPAP settings until she could get in due to her AHI being elevated. The patient was very argumentative about what said and states she cannot sleep for fear of breathing in "bacteria" while she is sleeping. At this point I just told the patient that it is pointless to discuss the previous conversation since we both recall it differently. I then asked her how far away she lives. I can possibly provide her with a new sample mask and tubing until she can get supplies. She states she does not live far, however her husband just returned from being out of the country and he would need to bring her because her car has broken down. She asked what day next week she could come. I told the patient that I will be here everyday next week with the exception of Thursday and Friday. I then told her that since she is concerned about breathing in bacteria I feel she needs to come by today. After some further reluctance, she agreed to come by the office today. She states she has to first shower and get dressed. I also informed the patient that I will send a referral to [facility] since she states her other [facility] ([name]) is located in [place].

## 2017-10-03 NOTE — Telephone Encounter (Signed)
Patient arrived  @ 4:45 pm to pick up CPAP mask sample. A hose was not provided as the one I have wasn't compatible to her machine. I looked at the hose that she is currently using and it looked fine. There were no obvious signs of it being unclean nor was there any visible tears or holes. She put the sample mask on and adjusted it to fit while here in the office. After a lengthy conversation about her overall health the patient appeared satisfied with the mask and left. She was instructed to call me to let me know how the mask worked for her. She was also informed that once [facility] receives her insurance benefit information they will be contacting her. Their contact information has been provided to the patient.

## 2017-10-07 ENCOUNTER — Other Ambulatory Visit: Payer: Self-pay | Admitting: Obstetrics and Gynecology

## 2017-10-07 ENCOUNTER — Ambulatory Visit (INDEPENDENT_AMBULATORY_CARE_PROVIDER_SITE_OTHER): Payer: Medicare Other

## 2017-10-07 ENCOUNTER — Other Ambulatory Visit: Payer: Self-pay

## 2017-10-07 ENCOUNTER — Encounter: Payer: Self-pay | Admitting: Obstetrics and Gynecology

## 2017-10-07 ENCOUNTER — Ambulatory Visit (INDEPENDENT_AMBULATORY_CARE_PROVIDER_SITE_OTHER): Payer: Medicare Other | Admitting: Obstetrics and Gynecology

## 2017-10-07 VITALS — BP 130/80 | HR 64 | Wt 345.0 lb

## 2017-10-07 DIAGNOSIS — E1165 Type 2 diabetes mellitus with hyperglycemia: Secondary | ICD-10-CM | POA: Insufficient documentation

## 2017-10-07 DIAGNOSIS — N921 Excessive and frequent menstruation with irregular cycle: Secondary | ICD-10-CM

## 2017-10-07 NOTE — Progress Notes (Signed)
GYNECOLOGY  VISIT   HPI: 52 y.o.   Married  Caucasian  female   [id number] with Patient's last menstrual period was 08/18/2017 (exact date).   here for consult after PUS done for AUB.  Recent normal TSH, not anemic in 5/19.   GYNECOLOGIC HISTORY: Patient's last menstrual period was 08/18/2017 (exact date). Contraception:None Menopausal hormone therapy: None        OB History    Gravida  2   Para  1   Term  1   Preterm      AB  1   Living  1     SAB      TAB  1   Ectopic      Multiple      Live Births                 Patient Active Problem List   Diagnosis Date Noted  . Acute on chronic systolic CHF (congestive heart failure) ([place]) 10/19/2016  . Hyperglycemia 10/19/2016  . SOB (shortness of breath) 06/22/2016  . Smoker 06/22/2016  . Normal coronary arteries 04/17/2016  . Obstructive sleep apnea syndrome 10/28/2014  . Major depressive disorder 10/28/2014  . Chronic systolic CHF (congestive heart failure), NYHA class 3 ([place]) 12/06/2013  . Dyslipidemia 04/04/2011  . Hypertension 06/03/2008  . Morbid obesity ([place]) 07/12/2006  . NICM (nonischemic cardiomyopathy) ([place]) 07/12/2006  . Attention deficit disorder 07/12/2006    Past Medical History:  Diagnosis Date  . ADD (attention deficit disorder)   . Anxiety   . Cardiomyopathy, peripartum, postpartum   . Carpal tunnel syndrome of right wrist   . CHF (congestive heart failure) ([place])   . Chronic systolic CHF (congestive heart failure), NYHA class 3 ([place]) 12/06/2013  . Depression   . Essential hypertension, benign   . Migraines   . Morbid obesity ([place])   . OSA (obstructive sleep apnea)   . Plantar fasciitis   . Spinal stenosis     Past Surgical History:  Procedure Laterality Date  . CHOLECYSTECTOMY    . RIGHT/LEFT HEART CATH AND CORONARY ANGIOGRAPHY N/A 04/11/2016   Procedure: Right/Left Heart Cath and Coronary Angiography;  Surgeon: Burnell Blanks, MD;  Location: [facility];  Service:  Cardiovascular;  Laterality: N/A;    Current Outpatient Medications  Medication Sig Dispense Refill  . amLODipine (NORVASC) 10 MG tablet TAKE 1 TABLET BY MOUTH EVERY DAY 90 tablet 2  . busPIRone (BUSPAR) 10 MG tablet Take 1 tablet (10 mg total) by mouth 2 (two) times daily. 60 tablet 1  . carvedilol (COREG) 25 MG tablet Take 1 tablet (25 mg total) by mouth 2 (two) times daily with a meal. 180 tablet 3  . clonazePAM (KLONOPIN) 1 MG tablet Take .5 mg qd prn anxiety 21 tablet 1  . clotrimazole-betamethasone (LOTRISONE) cream Apply 1 application topically 2 (two) times daily. 45 g 2  . COREG 12.5 MG tablet Take 1 tablet (12.5 mg total) by mouth 2 (two) times daily. 180 tablet 3  . dicyclomine (BENTYL) 20 MG tablet Take 1 tablet (20 mg total) by mouth every 6 (six) hours. 30 tablet 0  . enalapril (VASOTEC) 20 MG tablet TAKE 1 TABLET (20 MG TOTAL) BY MOUTH 2 (TWO) TIMES DAILY. 180 tablet 2  . furosemide (LASIX) 20 MG tablet Two tablet by mouth twice a day 360 tablet 1  . gabapentin (NEURONTIN) 100 MG capsule TAKE 2 CAPSULES BY MOUTH 3 TIMES DAILY. 270 capsule 0  . HYDROcodone-acetaminophen (NORCO/VICODIN)  5-325 MG tablet Take 1 tablet by mouth every 6 (six) hours as needed for moderate pain. 20 tablet 0  . ibuprofen (ADVIL,MOTRIN) 800 MG tablet TAKE 1 TABLET (800 MG TOTAL) BY MOUTH EVERY 8 (EIGHT) HOURS AS NEEDED FOR HEADACHE OR MILD PAIN. 14 tablet 0  . lamoTRIgine (LAMICTAL) 200 MG tablet Take 1 tablet (200 mg total) by mouth daily. 30 tablet 1  . potassium chloride SA (KLOR-CON M20) 20 MEQ tablet Take 1 tablet (20 mEq total) by mouth 2 (two) times daily. 180 tablet 3  . Psyllium (NAT-RUL PSYLLIUM SEED HUSKS) 500 MG CAPS 1-2 po qid as needed 240 each 2  . sertraline (ZOLOFT) 100 MG tablet Take two tablets PO QDAY 60 tablet 1  . simvastatin (ZOCOR) 20 MG tablet TAKE 1 TABLET BY MOUTH EVERY DAY 90 tablet 2  . albuterol (PROVENTIL HFA;VENTOLIN HFA) 108 (90 Base) MCG/ACT inhaler Inhale 2 puffs into the  lungs every 6 (six) hours as needed for wheezing or shortness of breath. (Patient not taking: Reported on 10/07/2017) 1 Inhaler 2   No current facility-administered medications for this visit.      ALLERGIES: Fluoxetine; Cymbalta [duloxetine hcl]; Duloxetine; Penicillins; and Prozac [fluoxetine hcl]  Family History  Problem Relation Age of Onset  . Depression Mother   . Anxiety disorder Mother   . Diabetes Mother   . Hypertension Mother   . ADD / ADHD Father   . Diabetes Father   . Hypertension Father   . Hypertension Other   . Diabetes Other   . Colitis Other   . Alcohol abuse Other     Social History   Socioeconomic History  . Marital status: Married    Spouse name: Not on file  . Number of children: Not on file  . Years of education: Not on file  . Highest education level: Not on file  Occupational History  . Not on file  Social Needs  . Financial resource strain: Not on file  . Food insecurity:    Worry: Not on file    Inability: Not on file  . Transportation needs:    Medical: Not on file    Non-medical: Not on file  Tobacco Use  . Smoking status: Former Smoker    Packs/day: 0.50    Types: Cigarettes    Last attempt to quit: 02/25/1997    Years since quitting: 20.6  . Smokeless tobacco: Never Used  . Tobacco comment: Married, lives with spouse (when he is not traveling) and son  Substance and Sexual Activity  . Alcohol use: No  . Drug use: No  . Sexual activity: Yes    Partners: Male    Birth control/protection: None  Lifestyle  . Physical activity:    Days per week: Not on file    Minutes per session: Not on file  . Stress: Not on file  Relationships  . Social connections:    Talks on phone: Not on file    Gets together: Not on file    Attends religious service: Not on file    Active member of club or organization: Not on file    Attends meetings of clubs or organizations: Not on file    Relationship status: Not on file  . Intimate partner  violence:    Fear of current or ex partner: Not on file    Emotionally abused: Not on file    Physically abused: Not on file    Forced sexual activity: Not on file  Other Topics Concern  . Not on file  Social History Narrative  . Not on file    Review of Systems  Constitutional: Negative.        Weight gain  HENT: Negative.   Eyes: Negative.   Respiratory: Negative.   Cardiovascular: Positive for leg swelling.  Gastrointestinal: Positive for constipation and diarrhea.       Change in stools Bloating  Genitourinary: Positive for frequency and urgency.       Excessive bleeding Painful menses Menses changes Nocturia  Musculoskeletal: Positive for back pain and joint pain.  Skin: Positive for itching and rash.  Neurological: Negative.   Endo/Heme/Allergies:       Excessive thirst Craving sweets  Psychiatric/Behavioral: The patient is nervous/anxious.        Depression    PHYSICAL EXAMINATION:    BP 130/80 (BP Location: Right Arm, Patient Position: Sitting, Cuff Size: Large)   Pulse 64   Wt (!) 345 lb (156.5 kg)   LMP 08/18/2017 (Exact Date)   BMI 60.16 kg/m     General appearance: alert, cooperative and appears stated age  Pelvic: External genitalia:  no lesions              Urethra:  normal appearing urethra with no masses, tenderness or lesions              Bartholins and Skenes: normal                 Vagina: normal appearing vagina with normal color and discharge, no lesions              Cervix:  no lesions  Sonohysterogram The procedure and risks of the procedure were reviewed with the patient, consent form was signed. A speculum was placed in the vagina and the cervix was cleansed with betadine. The sonohysterogram catheter was inserted into the uterine cavity without difficulty. The speculum was removed. Saline was infused under direct observation with the ultrasound. An intracavitary defect was noted, concerning for a possible polyp. The catheter was removed.      Chaperone was present for exam.  Ultrasound images were reviewed with the patient and her husband  ASSESSMENT AUB, defect on sonohysterogram concerning for endometrial polyp    PLAN Recommend hysteroscopy, D&C She will need pre-op clearance   An After Visit Summary was printed and given to the patient.  ~15 minutes face to face time of which over 50% was spent in counseling.   CC: Dr Juleen China, Dr Percival Spanish

## 2017-10-08 ENCOUNTER — Encounter: Payer: Self-pay | Admitting: Obstetrics and Gynecology

## 2017-10-09 ENCOUNTER — Encounter: Payer: Self-pay | Admitting: Physician Assistant

## 2017-10-09 ENCOUNTER — Ambulatory Visit (INDEPENDENT_AMBULATORY_CARE_PROVIDER_SITE_OTHER): Payer: Medicare Other | Admitting: Physician Assistant

## 2017-10-09 ENCOUNTER — Other Ambulatory Visit: Payer: Self-pay | Admitting: Cardiology

## 2017-10-09 VITALS — BP 134/86 | HR 81 | Ht 63.5 in | Wt 348.0 lb

## 2017-10-09 DIAGNOSIS — G4733 Obstructive sleep apnea (adult) (pediatric): Secondary | ICD-10-CM | POA: Diagnosis not present

## 2017-10-09 DIAGNOSIS — M7989 Other specified soft tissue disorders: Secondary | ICD-10-CM

## 2017-10-09 DIAGNOSIS — M79605 Pain in left leg: Secondary | ICD-10-CM | POA: Diagnosis not present

## 2017-10-09 DIAGNOSIS — M79604 Pain in right leg: Secondary | ICD-10-CM

## 2017-10-09 DIAGNOSIS — R7303 Prediabetes: Secondary | ICD-10-CM | POA: Diagnosis not present

## 2017-10-09 DIAGNOSIS — I420 Dilated cardiomyopathy: Secondary | ICD-10-CM

## 2017-10-09 DIAGNOSIS — R0600 Dyspnea, unspecified: Secondary | ICD-10-CM

## 2017-10-09 DIAGNOSIS — R0609 Other forms of dyspnea: Secondary | ICD-10-CM

## 2017-10-09 MED ORDER — FUROSEMIDE 40 MG PO TABS: 40.0000 mg | ORAL_TABLET | Freq: Every day | ORAL | 0 refills | Status: DC

## 2017-10-09 MED ORDER — SACUBITRIL-VALSARTAN 49-51 MG PO TABS: 1.0000 | ORAL_TABLET | Freq: Two times a day (BID) | ORAL | 0 refills | Status: DC

## 2017-10-09 MED ORDER — POTASSIUM CHLORIDE CRYS ER 20 MEQ PO TBCR: 20.0000 meq | EXTENDED_RELEASE_TABLET | Freq: Every day | ORAL | 3 refills | Status: DC

## 2017-10-09 MED ORDER — FUROSEMIDE 40 MG PO TABS: ORAL_TABLET | ORAL | 0 refills | Status: DC

## 2017-10-09 NOTE — Patient Instructions (Addendum)
Medication Instructions:  INCREASE Lasix to 40mg  twice daily for 3 days then decrease Lasix to 40mg  once a day  INCREASE Potassium to 75meq for twice a day for 3 days then decrease to 24meq once a day DISCONTINUE Enalapril  START Entresto 49/51mg  Take 1 tablet twice a day START THIS MEDICATION TOMORROW AFTERNOON  If you need a refill on your cardiac medications before your next appointment, please call your pharmacy.  Labwork:  NONE   Testing/Procedures: Your physician has requested that you have a lower extremity venous duplex. This test is an ultrasound of the veins in the legs. It looks at venous blood flow that carries blood from the heart to the legs. Allow one hour for a Lower Venous exam.  There are no restrictions or special instructions.  Follow-Up: Your physician recommends that you schedule a follow-up appointment in: 2 WEEKS WITH HAO MENG, PA-C  Any Other Special Instructions Will Be Listed Below (If Applicable).

## 2017-10-09 NOTE — Progress Notes (Signed)
Cardiology Office Note    Date:  10/11/2017   ID:  [name], DOB Mar 05, 1965, MRN [id number]  PCP:  Briscoe Deutscher, DO  Cardiologist:  Dr. Percival Spanish OSA: Dr. Claiborne Billings   Chief Complaint  Patient presents with  . Follow-up    pt is very SOB on the walk to the exam room--denies CP; states she is a little dizzy; also states he gets SOB on minimal exertion frequently    History of Present Illness:  [name] is a 52 y.o. 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  Patient had a normal coronary arteries on cardiac catheterization in February 2018.  Ejection fraction was 25 to 30% the time.  This also showed elevated LVEDP and mild pulmonary hypertension.  She had acute on chronic combined systolic and diastolic heart failure in August 2018.  She has chronic dyspnea on exertion.  Most recent echocardiogram obtained on 08/14/2017 showed EF 35 to 40%, diffuse hypokinesis, grade 2 DD.  Given persistent LV dysfunction, it was recommended for her to start on Entresto.  Recent cardiopulmonary stress test on 09/25/2017 was limited due to submaximal effort to do an exercise, normal functional capacity when compared to matched sedentary norms regarding her gas exchange, she did desaturate with exercise to as low as 88% with peak exercise which may contribute to her syndrome.  Presents today for cardiology office visit.  She was very short of breath walking in.  In the past month, she has gained roughly 10 pounds.  Her most recent echocardiogram showed her EF remain low.  On further review, she had positive d-dimer and negative CT angiogram of the chest in 2018.  However given her persistent shortness of breath, I do worry about DVT and PE.  I will obtain lower extremity venous Doppler.  I think majority of her shortness of breath is her uncontrolled weight gain combining with decreased  ejection fraction.  Still taking 40 mg twice daily of Lasix, she has only been taking it on a daily basis.  If the same applied to the potassium supplement, she is taking 20 mEq daily instead.  According to her husband and the patient, she did not want to go to the bathroom too frequently.  Interestingly enough, on physical exam, I do not think she is severely volume overloaded.  I will however increase her Lasix and potassium supplement for 3 days only as a trial.  During the meantime, I started her on Entresto therapy after discontinuing her enalapril.  She is aware that she need to hold enalapril for 36 hours before she started on the [facility] therapy.  During the meantime, and I encouraged her to lose more weight and increase activity level.  During that visit she has to go to the bathroom, upon returning, her O2 saturation was 85%.  Initially, I think she qualify for home oxygen, however on a formal ambulatory O2 test, her O2 saturations stayed at 89% or above throughout the entire walk.  I will hold off on ordering ambulatory O2 for her.   Past Medical History:  Diagnosis Date  . ADD (attention deficit disorder)   . Anxiety   . Cardiomyopathy, peripartum, postpartum   . Carpal tunnel syndrome of right wrist   . CHF (congestive heart failure) ([place])   . Chronic systolic CHF (congestive heart failure), NYHA class 3 ([place]) 12/06/2013  . Depression   . Essential hypertension, benign   .  Migraines   . Morbid obesity ([place])   . OSA (obstructive sleep apnea)   . Plantar fasciitis   . Prediabetes   . Spinal stenosis     Past Surgical History:  Procedure Laterality Date  . CHOLECYSTECTOMY    . RIGHT/LEFT HEART CATH AND CORONARY ANGIOGRAPHY N/A 04/11/2016   Procedure: Right/Left Heart Cath and Coronary Angiography;  Surgeon: Burnell Blanks, MD;  Location: [facility];  Service: Cardiovascular;  Laterality: N/A;    Current Medications: Outpatient Medications Prior to Visit    Medication Sig Dispense Refill  . albuterol (PROVENTIL HFA;VENTOLIN HFA) 108 (90 Base) MCG/ACT inhaler Inhale 2 puffs into the lungs every 6 (six) hours as needed for wheezing or shortness of breath. 1 Inhaler 2  . amLODipine (NORVASC) 10 MG tablet TAKE 1 TABLET BY MOUTH EVERY DAY 90 tablet 2  . busPIRone (BUSPAR) 10 MG tablet Take 1 tablet (10 mg total) by mouth 2 (two) times daily. 60 tablet 1  . carvedilol (COREG) 25 MG tablet Take 1 tablet (25 mg total) by mouth 2 (two) times daily with a meal. 180 tablet 3  . clonazePAM (KLONOPIN) 1 MG tablet Take .5 mg qd prn anxiety 21 tablet 1  . clotrimazole-betamethasone (LOTRISONE) cream Apply 1 application topically 2 (two) times daily. 45 g 2  . dicyclomine (BENTYL) 20 MG tablet Take 1 tablet (20 mg total) by mouth every 6 (six) hours. 30 tablet 0  . gabapentin (NEURONTIN) 100 MG capsule TAKE 2 CAPSULES BY MOUTH 3 TIMES DAILY. 270 capsule 0  . HYDROcodone-acetaminophen (NORCO/VICODIN) 5-325 MG tablet Take 1 tablet by mouth every 6 (six) hours as needed for moderate pain. 20 tablet 0  . ibuprofen (ADVIL,MOTRIN) 800 MG tablet TAKE 1 TABLET (800 MG TOTAL) BY MOUTH EVERY 8 (EIGHT) HOURS AS NEEDED FOR HEADACHE OR MILD PAIN. 14 tablet 0  . lamoTRIgine (LAMICTAL) 200 MG tablet Take 1 tablet (200 mg total) by mouth daily. 30 tablet 1  . Psyllium (NAT-RUL PSYLLIUM SEED HUSKS) 500 MG CAPS 1-2 po qid as needed 240 each 2  . sertraline (ZOLOFT) 100 MG tablet Take two tablets PO QDAY 60 tablet 1  . simvastatin (ZOCOR) 20 MG tablet TAKE 1 TABLET BY MOUTH EVERY DAY 90 tablet 2  . COREG 12.5 MG tablet Take 1 tablet (12.5 mg total) by mouth 2 (two) times daily. 180 tablet 3  . enalapril (VASOTEC) 20 MG tablet TAKE 1 TABLET (20 MG TOTAL) BY MOUTH 2 (TWO) TIMES DAILY. 180 tablet 2  . furosemide (LASIX) 20 MG tablet Two tablet by mouth twice a day 360 tablet 1  . potassium chloride SA (KLOR-CON M20) 20 MEQ tablet Take 1 tablet (20 mEq total) by mouth 2 (two) times  daily. 180 tablet 3   No facility-administered medications prior to visit.      Allergies:   Fluoxetine; Cymbalta [duloxetine hcl]; Duloxetine; Penicillins; and Prozac [fluoxetine hcl]   Social History   Socioeconomic History  . Marital status: Married    Spouse name: Not on file  . Number of children: Not on file  . Years of education: Not on file  . Highest education level: Not on file  Occupational History  . Not on file  Social Needs  . Financial resource strain: Not on file  . Food insecurity:    Worry: Not on file    Inability: Not on file  . Transportation needs:    Medical: Not on file    Non-medical: Not on file  Tobacco Use  . Smoking status: Former Smoker    Packs/day: 0.50    Types: Cigarettes    Last attempt to quit: 02/25/1997    Years since quitting: 20.6  . Smokeless tobacco: Never Used  . Tobacco comment: Married, lives with spouse (when he is not traveling) and son  Substance and Sexual Activity  . Alcohol use: No  . Drug use: No  . Sexual activity: Yes    Partners: Male    Birth control/protection: None  Lifestyle  . Physical activity:    Days per week: Not on file    Minutes per session: Not on file  . Stress: Not on file  Relationships  . Social connections:    Talks on phone: Not on file    Gets together: Not on file    Attends religious service: Not on file    Active member of club or organization: Not on file    Attends meetings of clubs or organizations: Not on file    Relationship status: Not on file  Other Topics Concern  . Not on file  Social History Narrative  . Not on file     Family History:  The patient's  family history includes ADD / ADHD in her father; Alcohol abuse in her other; Anxiety disorder in her mother; Colitis in her other; Depression in her mother; Diabetes in her father, mother, and other; Hypertension in her father, mother, and other.   ROS:   Please see the history of present illness.    ROS All other systems  reviewed and are negative.   PHYSICAL EXAM:   VS:  BP 134/86 (BP Location: Left Arm, Patient Position: Sitting, Cuff Size: Large)   Pulse 81   Ht 5' 3.5" (1.613 m)   Wt (!) 348 lb (157.9 kg)   BMI 60.68 kg/m    GEN: Well nourished, well developed, in no acute distress  HEENT: normal  Neck: no JVD, carotid bruits, or masses Cardiac: RRR; no murmurs, rubs, or gallops,no edema  Respiratory:  clear to auscultation bilaterally, normal work of breathing GI: soft, nontender, nondistended, + BS MS: no deformity or atrophy  Skin: warm and dry, no rash Neuro:  Alert and Oriented x 3, Strength and sensation are intact Psych: euthymic mood, full affect  Wt Readings from Last 3 Encounters:  10/09/17 (!) 348 lb (157.9 kg)  10/07/17 (!) 345 lb (156.5 kg)  09/09/17 (!) 338 lb (153.3 kg)      Studies/Labs Reviewed:   EKG:  EKG is not ordered today.    Recent Labs: 10/19/2016: B Natriuretic Peptide 296.2 02/12/2017: Pro B Natriuretic peptide (BNP) 44.0 07/14/2017: ALT 17; BUN 10; Creatinine, Ser 0.71; Hemoglobin 14.0; Platelets 275.0; Potassium 4.0; Sodium 138; TSH 4.18   Lipid Panel    Component Value Date/Time   CHOL 204 (H) 07/18/2016 1125   TRIG 123.0 07/18/2016 1125   HDL 51.10 07/18/2016 1125   CHOLHDL 4 07/18/2016 1125   VLDL 24.6 07/18/2016 1125   LDLCALC 128 (H) 07/18/2016 1125   LDLDIRECT 143.8 04/03/2011 0929    Additional studies/ records that were reviewed today include:   Cath 04/11/2016  There is moderate left ventricular systolic dysfunction.  LV end diastolic pressure is moderately elevated.  The left ventricular ejection fraction is 25-35% by visual estimate.  There is no mitral valve regurgitation.  Hemodynamic findings consistent with mild pulmonary hypertension.   1. No angiographic evidence of CAD 2. Non-ischemic cardiomyopathy with elevated filling pressures.  Recommendations: I will have her increase her Lasix to 40 mg po BID until she is seen in  follow up in 1 week in our office.     Echo 08/22/2017 LV EF: 35% -   40% Study Conclusions  - Left ventricle: The cavity size was moderately dilated. Wall   thickness was increased in a pattern of mild LVH. Systolic   function was moderately reduced. The estimated ejection fraction   was in the range of 35% to 40%. Diffuse hypokinesis. Features are   consistent with a pseudonormal left ventricular filling pattern,   with concomitant abnormal relaxation and increased filling   pressure (grade 2 diastolic dysfunction).   Cardiopulmonary Stress test 09/25/2017 Conclusion: The interpretation of this test is limited due to submaximal effort during the exercise. Based on available data, exercise testing with gas exchange demonstrates normal functional capacity when compared to matched sedentary norms. However, patient desaturated with exertion to a low of 88% at peak exercise, which is likely a primary factor of her symptoms. Patient is also morbidly obese and is severely limited due to her body habitus and related restrictive lung physiology. Please correlate Clinically,   ASSESSMENT:    1. DOE (dyspnea on exertion)   2. Swelling of lower extremity   3. Pain in both lower extremities   4. Morbid obesity ([place])   5. OSA (obstructive sleep apnea)   6. Prediabetes   7. Dilated cardiomyopathy ([place])      PLAN:  In order of problems listed above:  1. Dyspnea on exertion: This is likely multifactorial in nature.  Likely due to combination of morbid obesity and LV dysfunction.  She likely has obesity hypoventilation syndrome as well.  On initial arrival, her O2 saturation was 85% after walking from the bathroom to the exam room, however on formal ambulatory O2 test, her O2 saturation remained 89% and above.  She is very short of breath with minimal exertion.  I recommend a lower extremity venous Doppler to rule out DVT.  During the meantime, we need to focus on both weight loss and improvement of  her ejection fraction.  Otherwise, long-term prognosis would be very poor.  So want to do a trial of higher dose of diuretic.  She apparently has not been taking her Lasix or potassium as twice daily dosing as previously indicated, I instructed her to take them twice a day for the next 3 days before resuming daily dosing.  2. Dilated cardiomyopathy: She has a history of postpartum cardiomyopathy.  Ejection fraction on the most recent echocardiogram in June was 35%.  He is on carvedilol.  She was previously on spironolactone, however this was discontinued.  I will start her on Entresto for now.  In the future I hope to wean off amlodipine and continue to uptitrate heart failure medications including Entresto and reinitiating spironolactone.  Once she is maximized on a good regimen of heart failure medications, I plan to repeat echocardiogram in 3 months.  3. Lower extremity pain: I did not see obvious ipsilateral swelling on the lower extremity, however given that shortness of breath, I recommend a venous Doppler to rule out DVT  4. Morbid obesity: This is more the main problem, aggressive weight loss is recommended.  Fortunately, the shortness of breath on exertion is inhibiting her ability to exercise.  5. Prediabetes: Managed by primary care provider  6. Obstructive sleep apnea: On CPAP    Medication Adjustments/Labs and Tests Ordered: Current medicines are reviewed at length with  the patient today.  Concerns regarding medicines are outlined above.  Medication changes, Labs and Tests ordered today are listed in the Patient Instructions below. Patient Instructions  Medication Instructions:  INCREASE Lasix to 40mg  twice daily for 3 days then decrease Lasix to 40mg  once a day  INCREASE Potassium to 20meq for twice a day for 3 days then decrease to 45meq once a day DISCONTINUE Enalapril  START Entresto 49/51mg  Take 1 tablet twice a day START THIS MEDICATION TOMORROW AFTERNOON  If you need a  refill on your cardiac medications before your next appointment, please call your pharmacy.  Labwork:  NONE   Testing/Procedures: Your physician has requested that you have a lower extremity venous duplex. This test is an ultrasound of the veins in the legs. It looks at venous blood flow that carries blood from the heart to the legs. Allow one hour for a Lower Venous exam.  There are no restrictions or special instructions.  Follow-Up: Your physician recommends that you schedule a follow-up appointment in: 2 WEEKS WITH Seleta Hovland, PA-C  Any Other Special Instructions Will Be Listed Below (If Applicable).          Hilbert Corrigan, [place]  10/11/2017 3:30 PM    [address], [place], [place]  [address] Phone: [phone]; Fax: [phone]

## 2017-10-10 ENCOUNTER — Other Ambulatory Visit: Payer: Self-pay

## 2017-10-10 ENCOUNTER — Ambulatory Visit
Admission: RE | Admit: 2017-10-10 | Discharge: 2017-10-10 | Disposition: A | Discharge status: Home / Self Care | Payer: Medicare Other | Source: Ambulatory Visit | Attending: Obstetrics and Gynecology | Admitting: Obstetrics and Gynecology

## 2017-10-10 DIAGNOSIS — Z1231 Encounter for screening mammogram for malignant neoplasm of breast: Secondary | ICD-10-CM

## 2017-10-10 MED ORDER — COREG 12.5 MG PO TABS: 12.5000 mg | ORAL_TABLET | Freq: Two times a day (BID) | ORAL | 3 refills | Status: DC

## 2017-10-10 NOTE — Telephone Encounter (Signed)
Rx(s) sent to pharmacy electronically.  

## 2017-10-11 ENCOUNTER — Encounter: Payer: Self-pay | Admitting: Physician Assistant

## 2017-10-13 ENCOUNTER — Other Ambulatory Visit: Payer: Self-pay | Admitting: Obstetrics and Gynecology

## 2017-10-13 DIAGNOSIS — R928 Other abnormal and inconclusive findings on diagnostic imaging of breast: Secondary | ICD-10-CM

## 2017-10-16 ENCOUNTER — Ambulatory Visit (HOSPITAL_COMMUNITY): Payer: Self-pay | Admitting: Psychiatry

## 2017-10-17 ENCOUNTER — Ambulatory Visit (HOSPITAL_COMMUNITY)
Admission: RE | Admit: 2017-10-17 | Discharge: 2017-10-17 | Disposition: A | Discharge status: Home / Self Care | Payer: Medicare Other | Source: Ambulatory Visit | Attending: Internal Medicine | Admitting: Internal Medicine

## 2017-10-17 ENCOUNTER — Telehealth: Payer: Self-pay

## 2017-10-17 DIAGNOSIS — M79604 Pain in right leg: Secondary | ICD-10-CM

## 2017-10-17 DIAGNOSIS — M79605 Pain in left leg: Secondary | ICD-10-CM | POA: Diagnosis not present

## 2017-10-17 DIAGNOSIS — M7989 Other specified soft tissue disorders: Secondary | ICD-10-CM

## 2017-10-17 NOTE — Telephone Encounter (Signed)
Pt in the office today for a test and had questions about her CPAP supplies. Spoke with pt as the mask given to her a few weeks ago does not work as well as she thought. She is wanting updated on her supplies from [place] with representative at [employer] on 8/16 they sent documents for Dr. Claiborne Billings to sign. I informed her that he was out to the eoffice but on his return this week we would get it signed and sent over. Rep. At [employer] was going to refax to our office as I could not put my hands on it at that time.  Patient verbalized understanding, no additional questions at this time.

## 2017-10-20 ENCOUNTER — Ambulatory Visit
Admission: RE | Admit: 2017-10-20 | Discharge: 2017-10-20 | Disposition: A | Discharge status: Home / Self Care | Payer: Medicare Other | Source: Ambulatory Visit | Attending: Obstetrics and Gynecology | Admitting: Obstetrics and Gynecology

## 2017-10-20 ENCOUNTER — Other Ambulatory Visit: Payer: Self-pay | Admitting: Obstetrics and Gynecology

## 2017-10-20 DIAGNOSIS — R928 Other abnormal and inconclusive findings on diagnostic imaging of breast: Secondary | ICD-10-CM | POA: Diagnosis not present

## 2017-10-20 DIAGNOSIS — N63 Unspecified lump in unspecified breast: Secondary | ICD-10-CM

## 2017-10-20 DIAGNOSIS — N6310 Unspecified lump in the right breast, unspecified quadrant: Secondary | ICD-10-CM | POA: Diagnosis not present

## 2017-10-21 ENCOUNTER — Ambulatory Visit (INDEPENDENT_AMBULATORY_CARE_PROVIDER_SITE_OTHER): Payer: Medicare Other | Admitting: Psychiatry

## 2017-10-21 ENCOUNTER — Encounter (HOSPITAL_COMMUNITY): Payer: Self-pay | Admitting: Psychiatry

## 2017-10-21 VITALS — BP 112/76 | HR 86 | Ht 62.5 in | Wt 339.0 lb

## 2017-10-21 DIAGNOSIS — I509 Heart failure, unspecified: Secondary | ICD-10-CM | POA: Diagnosis not present

## 2017-10-21 DIAGNOSIS — I110 Hypertensive heart disease with heart failure: Secondary | ICD-10-CM | POA: Diagnosis not present

## 2017-10-21 DIAGNOSIS — F3341 Major depressive disorder, recurrent, in partial remission: Secondary | ICD-10-CM | POA: Diagnosis not present

## 2017-10-21 DIAGNOSIS — Z87891 Personal history of nicotine dependence: Secondary | ICD-10-CM | POA: Diagnosis not present

## 2017-10-21 DIAGNOSIS — I429 Cardiomyopathy, unspecified: Secondary | ICD-10-CM

## 2017-10-21 DIAGNOSIS — I5022 Chronic systolic (congestive) heart failure: Secondary | ICD-10-CM | POA: Diagnosis not present

## 2017-10-21 DIAGNOSIS — Z818 Family history of other mental and behavioral disorders: Secondary | ICD-10-CM | POA: Diagnosis not present

## 2017-10-21 NOTE — Progress Notes (Signed)
No evidence of DVT.

## 2017-10-21 NOTE — Progress Notes (Signed)
BH MD/PA/NP OP Progress Note  10/21/2017 3:07 PM [name]  MRN:  [id number]  Chief Complaint: Patient returns for medication management appointment HPI: 52-year old married female, history of depression and anxiety.  History of medical illnesses to include cardiomyopathy,HTN, CHF, obesity. Returns for her scheduled appointment. She reports she is feeling better and presents with an improved mood and a fuller range of affect compared to last visit.  She attributes this to improving physical symptoms.  States her cardiologist has adjusted medications and that she is now "breathing more easily" and notices she has more energy and is not as easily tired .  Of note, she states that she joined a rehab program for patients with cardiac illnesses which entails regular exercise (starts in 2 weeks). She also describes a more stable relationship with her husband.  In the past husband had left for periods of time without informing her, but he has now been at home consistently for more than a year and their relationship has improved. She reports lingering depression and continues to describe grief/morning regarding the recent death of her mother, who died earlier this year.  She does however endorse that her mood is better and presents at this time with a full range of affect, smiling and even laughing at times appropriately during session. Denies medication side effects. 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 ADD (attention deficit disorder)   . Anxiety   . Cardiomyopathy, peripartum, postpartum   . Carpal tunnel syndrome of right wrist   . CHF (congestive heart failure) ([place])   . Chronic systolic CHF (congestive heart failure), NYHA class 3 ([place]) 12/06/2013  . Depression   . Essential hypertension, benign   . Migraines   . Morbid obesity ([place])   . OSA (obstructive sleep apnea)   .  Plantar fasciitis   . Prediabetes   . Spinal stenosis     Past Surgical History:  Procedure Laterality Date  . CHOLECYSTECTOMY    . RIGHT/LEFT HEART CATH AND CORONARY ANGIOGRAPHY N/A 04/11/2016   Procedure: Right/Left Heart Cath and Coronary Angiography;  Surgeon: Burnell Blanks, MD;  Location: [facility];  Service: Cardiovascular;  Laterality: N/A;    Family Psychiatric History:   Family History:  Family History  Problem Relation Age of Onset  . Depression Mother   . Anxiety disorder Mother   . Diabetes Mother   . Hypertension Mother   . ADD / ADHD Father   . Diabetes Father   . Hypertension Father   . Hypertension Other   . Diabetes Other   . Colitis Other   . Alcohol abuse Other     Social History:  Social History   Socioeconomic History  . Marital status: Married    Spouse name: Not on file  . Number of children: Not on file  . Years of education: Not on file  . Highest education level: Not on file  Occupational History  . Not on file  Social Needs  . Financial resource strain: Not on file  . Food insecurity:    Worry: Not on file    Inability: Not on file  . Transportation needs:    Medical: Not on file    Non-medical: Not on file  Tobacco Use  . Smoking status: Former Smoker    Packs/day: 0.50    Types: Cigarettes    Last attempt  to quit: 02/25/1997    Years since quitting: 20.6  . Smokeless tobacco: Never Used  . Tobacco comment: Married, lives with spouse (when he is not traveling) and son  Substance and Sexual Activity  . Alcohol use: No  . Drug use: No  . Sexual activity: Yes    Partners: Male    Birth control/protection: None  Lifestyle  . Physical activity:    Days per week: Not on file    Minutes per session: Not on file  . Stress: Not on file  Relationships  . Social connections:    Talks on phone: Not on file    Gets together: Not on file    Attends religious service: Not on file    Active member of club or  organization: Not on file    Attends meetings of clubs or organizations: Not on file    Relationship status: Not on file  Other Topics Concern  . Not on file  Social History Narrative  . Not on file    Allergies:  Allergies  Allergen Reactions  . Fluoxetine Other (See Comments)    More depressed  . Cymbalta [Duloxetine Hcl] Other (See Comments)    depressed  . Duloxetine     REACTION: Sucidal thoughts  . Penicillins     REACTION: rash Has patient had a PCN reaction causing immediate rash, facial/tongue/throat swelling, SOB or lightheadedness with hypotension: YES Has patient had a PCN reaction causing severe rash involving mucus membranes or skin necrosis: NO Has patient had a PCN reaction that required hospitalization: YES Has patient had a PCN reaction occurring within the last 10 years: NO If all of the above answers are "NO", then may proceed with Cephalosporin use.   . Prozac [Fluoxetine Hcl] Other (See Comments)    More depressed    Metabolic Disorder Labs: Lab Results  Component Value Date   HGBA1C 6.0 02/12/2017   MPG 125.5 10/19/2016   MPG 125.5 10/19/2016   No results found for: PROLACTIN Lab Results  Component Value Date   CHOL 204 (H) 07/18/2016   TRIG 123.0 07/18/2016   HDL 51.10 07/18/2016   CHOLHDL 4 07/18/2016   VLDL 24.6 07/18/2016   LDLCALC 128 (H) 07/18/2016   LDLCALC 78 11/08/2013   Lab Results  Component Value Date   TSH 4.18 07/14/2017   TSH 0.804 10/19/2016    Therapeutic Level Labs: No results found for: LITHIUM No results found for: VALPROATE No components found for:  CBMZ  Current Medications: Current Outpatient Medications  Medication Sig Dispense Refill  . albuterol (PROVENTIL HFA;VENTOLIN HFA) 108 (90 Base) MCG/ACT inhaler Inhale 2 puffs into the lungs every 6 (six) hours as needed for wheezing or shortness of breath. 1 Inhaler 2  . amLODipine (NORVASC) 10 MG tablet TAKE 1 TABLET BY MOUTH EVERY DAY 90 tablet 2  . busPIRone  (BUSPAR) 10 MG tablet Take 1 tablet (10 mg total) by mouth 2 (two) times daily. 60 tablet 1  . carvedilol (COREG) 25 MG tablet Take 1 tablet (25 mg total) by mouth 2 (two) times daily with a meal. 180 tablet 3  . clonazePAM (KLONOPIN) 1 MG tablet Take .5 mg qd prn anxiety 21 tablet 1  . clotrimazole-betamethasone (LOTRISONE) cream Apply 1 application topically 2 (two) times daily. 45 g 2  . COREG 12.5 MG tablet Take 1 tablet (12.5 mg total) by mouth 2 (two) times daily with a meal. 180 tablet 3  . dicyclomine (BENTYL) 20 MG tablet Take 1  tablet (20 mg total) by mouth every 6 (six) hours. 30 tablet 0  . furosemide (LASIX) 40 MG tablet Take 1 tablet (40 mg total) by mouth daily. Take as directed 30 tablet 0  . ibuprofen (ADVIL,MOTRIN) 800 MG tablet TAKE 1 TABLET (800 MG TOTAL) BY MOUTH EVERY 8 (EIGHT) HOURS AS NEEDED FOR HEADACHE OR MILD PAIN. 14 tablet 0  . lamoTRIgine (LAMICTAL) 200 MG tablet Take 1 tablet (200 mg total) by mouth daily. 30 tablet 1  . potassium chloride SA (KLOR-CON M20) 20 MEQ tablet Take 1 tablet (20 mEq total) by mouth daily. 180 tablet 3  . Psyllium (NAT-RUL PSYLLIUM SEED HUSKS) 500 MG CAPS 1-2 po qid as needed 240 each 2  . sacubitril-valsartan (ENTRESTO) 49-51 MG Take 1 tablet by mouth 2 (two) times daily. 60 tablet 0  . sertraline (ZOLOFT) 100 MG tablet Take two tablets PO QDAY 60 tablet 1  . simvastatin (ZOCOR) 20 MG tablet TAKE 1 TABLET BY MOUTH EVERY DAY 90 tablet 2   No current facility-administered medications for this visit.      Musculoskeletal: Strength & Muscle Tone: within normal limits Gait & Station: normal Patient leans: N/A  Psychiatric Specialty Exam: ROS no chest pain, reports improving dyspnea  Blood pressure 112/76, pulse 86, height 5' 2.5" (1.588 m), weight (!) 153.8 kg, SpO2 95 %.Body mass index is 61.02 kg/m.  General Appearance: Well Groomed  Eye Contact:  Good  Speech:  Normal Rate  Volume:  Normal  Mood:  Improving mood and currently  seems euthymic  Affect:  Appropriate and Full Range  Thought Process:  Linear and Descriptions of Associations: Intact  Orientation:  Full (Time, Place, and Person)  Thought Content: No hallucinations, no delusions, not internally preoccupied   Suicidal Thoughts:  No-denies suicidal ideations, no self-injurious ideations  Homicidal Thoughts:  No  Memory:  Recent and remote grossly intact  Judgement:  Other:  Present  Insight:  Present  Psychomotor Activity:  Normal  Concentration:  Concentration: Good and Attention Span: Good  Recall:  Good  Fund of Knowledge: Good  Language: Good  Akathisia:  Negative  Handed:  Right  AIMS (if indicated): No abnormal movements noted or reported  Assets:  Desire for Improvement Resilience  ADL's:  Intact  Cognition: WNL  Sleep:  Good   Screenings: AUDIT     Admission (Discharged) from 12/05/2013 in [facility]  Alcohol Use Disorder Identification Test Final Score (AUDIT)  0    PHQ2-9     Office Visit from 10/18/2016 in [place] from 06/26/2016 in [place] Visit from 07/19/2015 in [facility] at [place] Visit from 07/05/2015 in [place] at [facility]  PHQ-2 Total Score  5  4  2   0  PHQ-9 Total Score  8  21  5   -       Assessment and Plan: Patient is presenting with improving mood and range of affect.  Today presents euthymic with a bright affect.  Does not endorse any major neurovegetative symptoms of depression at this time.  Acknowledges partial improvement and attributes improvement at least partially to improving dyspnea and feeling her medical/cardiac symptoms are better controlled. Denies medication side effects.  Currently does not need scripts as she has refills available.  No medications prescribed today/no medication changes made. Continue BuSpar 10 mg twice  daily, Klonopin 0.5 mg daily as needed for  anxiety, Lamictal 200 mg daily, Zoloft 200 mg daily. We will see in 1 month, agrees to contact me sooner should to be any worsening prior.   Jenne Campus, MD 10/21/2017, 3:07 PM

## 2017-10-22 ENCOUNTER — Ambulatory Visit (INDEPENDENT_AMBULATORY_CARE_PROVIDER_SITE_OTHER): Payer: Medicare Other | Admitting: Physician Assistant

## 2017-10-22 ENCOUNTER — Encounter: Payer: Self-pay | Admitting: Physician Assistant

## 2017-10-22 ENCOUNTER — Telehealth: Payer: Self-pay | Admitting: Obstetrics and Gynecology

## 2017-10-22 ENCOUNTER — Telehealth: Payer: Self-pay | Admitting: *Deleted

## 2017-10-22 VITALS — BP 111/67 | HR 75 | Ht 62.5 in | Wt 342.6 lb

## 2017-10-22 DIAGNOSIS — O903 Peripartum cardiomyopathy: Secondary | ICD-10-CM

## 2017-10-22 DIAGNOSIS — G4733 Obstructive sleep apnea (adult) (pediatric): Secondary | ICD-10-CM | POA: Diagnosis not present

## 2017-10-22 DIAGNOSIS — I5022 Chronic systolic (congestive) heart failure: Secondary | ICD-10-CM

## 2017-10-22 DIAGNOSIS — Z01810 Encounter for preprocedural cardiovascular examination: Secondary | ICD-10-CM

## 2017-10-22 DIAGNOSIS — R7303 Prediabetes: Secondary | ICD-10-CM | POA: Diagnosis not present

## 2017-10-22 DIAGNOSIS — I5023 Acute on chronic systolic (congestive) heart failure: Secondary | ICD-10-CM | POA: Diagnosis not present

## 2017-10-22 MED ORDER — SPIRONOLACTONE 25 MG PO TABS: 12.5000 mg | ORAL_TABLET | Freq: Every day | ORAL | 3 refills | Status: DC

## 2017-10-22 MED ORDER — SACUBITRIL-VALSARTAN 97-103 MG PO TABS: 1.0000 | ORAL_TABLET | Freq: Two times a day (BID) | ORAL | 1 refills | Status: DC

## 2017-10-22 MED ORDER — FUROSEMIDE 40 MG PO TABS: 40.0000 mg | ORAL_TABLET | Freq: Two times a day (BID) | ORAL | 0 refills | Status: DC

## 2017-10-22 NOTE — Telephone Encounter (Signed)
Patient was here in the office for a visit with Almyra Deforest and inquired about her CPAP supplies. I called over to [facility] and spoke with Angie, who informed me that she hasn't been able to get in contact with the patient's [facility] provider to check benefits. She is trying to see if they will cover whatever cost that her [facility] doesn't cover. Angie informed me that she will try contacting [facility] again today. Once she has verified the patient's benefits she will call her. This information along with Angies's contact information was given to the patient.

## 2017-10-22 NOTE — Telephone Encounter (Signed)
Patient is currently at her cardiologist and they have not heard from Dr. Talbert Nan about getting clearance for a procedure she needs to have done.

## 2017-10-22 NOTE — Telephone Encounter (Signed)
Return call to patient. No answer, Voicemailbox full.   Pt needs to have pulmonary clearance per cardiology.  Pulmonary appointment on 11/04/17.   Pt ordered to have Dilatation and Curettage Hysteroscopy with Dr. Talbert Nan. For AUB, defect on sonohysterogram concerning for endometrial polyp.   Needs cardiology and or pulmonary clearance in order to schedule.

## 2017-10-22 NOTE — Progress Notes (Signed)
Cardiology Office Note    Date:  10/23/2017   ID:  [name], DOB 1965-11-05, MRN [id number]  PCP:  Briscoe Deutscher, DO  Cardiologist:   Dr. Percival Spanish OSA: Dr. Claiborne Billings    Chief Complaint  Patient presents with  . Follow-up    seen for Dr. Percival Spanish.     History of Present Illness:  [name] is a 52 y.o. 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  Patient had a normal coronary arteries on cardiac catheterization in February 2018.  Ejection fraction was 25 to 30% the time.  This also showed elevated LVEDP and mild pulmonary hypertension.  She had acute on chronic combined systolic and diastolic heart failure in August 2018.  She has chronic dyspnea on exertion.  Most recent echocardiogram obtained on 08/14/2017 showed EF 35 to 40%, diffuse hypokinesis, grade 2 DD.  Given persistent LV dysfunction, it was recommended for her to start on Entresto.  Recent cardiopulmonary stress test on 09/25/2017 was limited due to submaximal effort to do an exercise, normal functional capacity when compared to matched sedentary norms regarding her gas exchange, she did desaturate with exercise to as low as 88% with peak exercise which may contribute to her syndrome.  I last saw the patient on 10/09/2017, she was very short of breath when she walked in.  She also gained roughly 10 pounds in 1 month as well.  I obtained a lower extremity venous Doppler which was negative.  I think majority of her shortness of breath is related to uncontrolled weight gain, and was decreased ejection fraction.  According to the husband, she was taking her Lasix as potassium only daily instead of twice daily, however patient is under the impression she is taking it twice a day.  I increased her Lasix and potassium for 3 days only as a trial.  During the meantime, I also started her on Entresto therapy after  discontinuing her enalapril.  She presents today for cardiology office visit, she is feeling much better on the [facility].  She is breathing better as well.  I think she is stable to go through with gynecological surgery from cardiac perspective.  Otherwise, her blood pressure stable, I will restart her spironolactone at 12.5 mg daily.  I also uptitrated Entresto to 97-103 mg twice daily.  She will need basic metabolic panel today and also in 1 week.  At this time, I will discontinue amlodipine and focus on heart failure medication titration.  I recommended a repeat echocardiogram in 3 months before her next visit with Dr. Percival Spanish.    Past Medical History:  Diagnosis Date  . ADD (attention deficit disorder)   . Anxiety   . Cardiomyopathy, peripartum, postpartum   . Carpal tunnel syndrome of right wrist   . CHF (congestive heart failure) ([place])   . Chronic systolic CHF (congestive heart failure), NYHA class 3 ([place]) 12/06/2013  . Depression   . Essential hypertension, benign   . Migraines   . Morbid obesity ([place])   . OSA (obstructive sleep apnea)   . Plantar fasciitis   . Prediabetes   . Spinal stenosis     Past Surgical History:  Procedure Laterality Date  . CHOLECYSTECTOMY    . RIGHT/LEFT HEART CATH AND CORONARY ANGIOGRAPHY N/A 04/11/2016   Procedure: Right/Left Heart Cath and Coronary Angiography;  Surgeon: Burnell Blanks, MD;  Location: [facility];  Service:  Cardiovascular;  Laterality: N/A;    Current Medications: Outpatient Medications Prior to Visit  Medication Sig Dispense Refill  . busPIRone (BUSPAR) 10 MG tablet Take 1 tablet (10 mg total) by mouth 2 (two) times daily. 60 tablet 1  . carvedilol (COREG) 25 MG tablet Take 1 tablet (25 mg total) by mouth 2 (two) times daily with a meal. 180 tablet 3  . clonazePAM (KLONOPIN) 1 MG tablet Take .5 mg qd prn anxiety 21 tablet 1  . clotrimazole-betamethasone (LOTRISONE) cream Apply 1 application topically 2 (two) times  daily. 45 g 2  . COREG 12.5 MG tablet Take 1 tablet (12.5 mg total) by mouth 2 (two) times daily with a meal. 180 tablet 3  . dicyclomine (BENTYL) 20 MG tablet Take 1 tablet (20 mg total) by mouth every 6 (six) hours. 30 tablet 0  . ibuprofen (ADVIL,MOTRIN) 800 MG tablet TAKE 1 TABLET (800 MG TOTAL) BY MOUTH EVERY 8 (EIGHT) HOURS AS NEEDED FOR HEADACHE OR MILD PAIN. 14 tablet 0  . lamoTRIgine (LAMICTAL) 200 MG tablet Take 1 tablet (200 mg total) by mouth daily. 30 tablet 1  . potassium chloride SA (KLOR-CON M20) 20 MEQ tablet Take 1 tablet (20 mEq total) by mouth daily. 180 tablet 3  . Psyllium (NAT-RUL PSYLLIUM SEED HUSKS) 500 MG CAPS 1-2 po qid as needed 240 each 2  . sertraline (ZOLOFT) 100 MG tablet Take two tablets PO QDAY 60 tablet 1  . simvastatin (ZOCOR) 20 MG tablet TAKE 1 TABLET BY MOUTH EVERY DAY 90 tablet 2  . amLODipine (NORVASC) 10 MG tablet TAKE 1 TABLET BY MOUTH EVERY DAY 90 tablet 2  . furosemide (LASIX) 40 MG tablet Take 1 tablet (40 mg total) by mouth daily. Take as directed 30 tablet 0  . sacubitril-valsartan (ENTRESTO) 49-51 MG Take 1 tablet by mouth 2 (two) times daily. 60 tablet 0  . albuterol (PROVENTIL HFA;VENTOLIN HFA) 108 (90 Base) MCG/ACT inhaler Inhale 2 puffs into the lungs every 6 (six) hours as needed for wheezing or shortness of breath. 1 Inhaler 2   No facility-administered medications prior to visit.      Allergies:   Fluoxetine; Cymbalta [duloxetine hcl]; Duloxetine; Penicillins; and Prozac [fluoxetine hcl]   Social History   Socioeconomic History  . Marital status: Married    Spouse name: Not on file  . Number of children: Not on file  . Years of education: Not on file  . Highest education level: Not on file  Occupational History  . Not on file  Social Needs  . Financial resource strain: Not on file  . Food insecurity:    Worry: Not on file    Inability: Not on file  . Transportation needs:    Medical: Not on file    Non-medical: Not on file    Tobacco Use  . Smoking status: Former Smoker    Packs/day: 0.50    Types: Cigarettes    Last attempt to quit: 02/25/1997    Years since quitting: 20.6  . Smokeless tobacco: Never Used  . Tobacco comment: Married, lives with spouse (when he is not traveling) and son  Substance and Sexual Activity  . Alcohol use: No  . Drug use: No  . Sexual activity: Yes    Partners: Male    Birth control/protection: None  Lifestyle  . Physical activity:    Days per week: Not on file    Minutes per session: Not on file  . Stress: Not on file  Relationships  .  Social connections:    Talks on phone: Not on file    Gets together: Not on file    Attends religious service: Not on file    Active member of club or organization: Not on file    Attends meetings of clubs or organizations: Not on file    Relationship status: Not on file  Other Topics Concern  . Not on file  Social History Narrative  . Not on file     Family History:  The patient's family history includes ADD / ADHD in her father; Alcohol abuse in her other; Anxiety disorder in her mother; Colitis in her other; Depression in her mother; Diabetes in her father, mother, and other; Hypertension in her father, mother, and other.   ROS:   Please see the history of present illness.    ROS All other systems reviewed and are negative.   PHYSICAL EXAM:   VS:  BP 111/67   Pulse 75   Ht 5' 2.5" (1.588 m)   Wt (!) 342 lb 9.6 oz (155.4 kg)   BMI 61.66 kg/m    GEN: Well nourished, well developed, in no acute distress  HEENT: normal  Neck: no JVD, carotid bruits, or masses Cardiac: RRR; no murmurs, rubs, or gallops,no edema  Respiratory:  clear to auscultation bilaterally, normal work of breathing GI: soft, nontender, nondistended, + BS MS: no deformity or atrophy  Skin: warm and dry, no rash Neuro:  Alert and Oriented x 3, Strength and sensation are intact Psych: euthymic mood, full affect  Wt Readings from Last 3 Encounters:   10/22/17 (!) 342 lb 9.6 oz (155.4 kg)  10/09/17 (!) 348 lb (157.9 kg)  10/07/17 (!) 345 lb (156.5 kg)      Studies/Labs Reviewed:   EKG:  EKG is not ordered today.    Recent Labs: 02/12/2017: Pro B Natriuretic peptide (BNP) 44.0 07/14/2017: ALT 17; Hemoglobin 14.0; Platelets 275.0; TSH 4.18 10/22/2017: BUN 20; Creatinine, Ser 0.75; Potassium 4.6; Sodium 142   Lipid Panel    Component Value Date/Time   CHOL 204 (H) 07/18/2016 1125   TRIG 123.0 07/18/2016 1125   HDL 51.10 07/18/2016 1125   CHOLHDL 4 07/18/2016 1125   VLDL 24.6 07/18/2016 1125   LDLCALC 128 (H) 07/18/2016 1125   LDLDIRECT 143.8 04/03/2011 0929    Additional studies/ records that were reviewed today include:   Cath 04/11/2016  There is moderate left ventricular systolic dysfunction.  LV end diastolic pressure is moderately elevated.  The left ventricular ejection fraction is 25-35% by visual estimate.  There is no mitral valve regurgitation.  Hemodynamic findings consistent with mild pulmonary hypertension.  1. No angiographic evidence of CAD 2. Non-ischemic cardiomyopathy with elevated filling pressures.   Recommendations: I will have her increase her Lasix to 40 mg po BID until she is seen in follow up in 1 week in our office.     Echo 08/22/2017 LV EF: 35% - 40% Study Conclusions  - Left ventricle: The cavity size was moderately dilated. Wall thickness was increased in a pattern of mild LVH. Systolic function was moderately reduced. The estimated ejection fraction was in the range of 35% to 40%. Diffuse hypokinesis. Features are consistent with a pseudonormal left ventricular filling pattern, with concomitant abnormal relaxation and increased filling pressure (grade 2 diastolic dysfunction).   Cardiopulmonary Stress test 09/25/2017 Conclusion: The interpretation of this test is limited due to submaximal effort during the exercise. Based on available data, exercise testing  with gas exchange  demonstrates normal functional capacity when compared to matched sedentary norms. However, patient desaturated with exertion to a low of 88% at peak exercise, which is likely a primary factor of her symptoms. Patient is also morbidly obese and is severely limited due to her body habitus and related restrictive lung physiology. Please correlate Clinically,    ASSESSMENT:    1. Chronic systolic heart failure ([place])   2. Morbid obesity ([place])   3. OSA (obstructive sleep apnea)   4. Prediabetes   5. Postpartum cardiomyopathy   6. Preop cardiovascular exam      PLAN:  In order of problems listed above:  1. Chronic systolic heart failure: She has managed to lose 6 pounds after a temporary increasing the diuretic and initiation of Entresto.  Her breathing is much better.  2. Preoperative clearance: Upcoming gynecological surgery by Dr. Talbert Nan.  Her breathing has significantly improved compared to last week, she is cleared to proceed with the procedure.  3. Postpartum cardiomyopathy: We will continue to uptitrate heart failure medication.  Increase Entresto to 97-103 mg twice daily.  Start on spironolactone 12.5 mg daily.  Basic metabolic panel today and also in 1 week.  Will stop amlodipine.  Obtain repeat echocardiogram in 3 months before her next visit with Dr. Percival Spanish  4. Prediabetes: Managed by primary care provider  5. Morbid obesity: Weight loss recommended.  She recently enrolled herself in a exercise program.  This was started yesterday.    Medication Adjustments/Labs and Tests Ordered: Current medicines are reviewed at length with the patient today.  Concerns regarding medicines are outlined above.  Medication changes, Labs and Tests ordered today are listed in the Patient Instructions below. Patient Instructions  Medication Instructions:  Your physician has recommended you make the following change in your medication:  1) STOP Amlodipine 2) START Spironolactone  12.5mg  daily. An Rx has been sent to your pharmacy 3) INCREASE Entresto to 97-103mg  bid. An Rx has been sent to your pharmacy   Labwork: Bmet today  Your physician recommends that you return for lab work in: 1 week ( repeat Bmet)   Testing/Procedures: Your physician has requested that you have an echocardiogram. Echocardiography is a painless test that uses sound waves to create images of your heart. It provides your doctor with information about the size and shape of your heart and how well your heart's chambers and valves are working. This procedure takes approximately one hour. There are no restrictions for this procedure. (To be scheduled the end of November 2019)  Follow-Up: Your physician recommends that you schedule a follow-up appointment with Dr.Hochrein after your echo  Follow up as planned with Dr.Kelly    Any Other Special Instructions Will Be Listed Below (If Applicable).     If you need a refill on your cardiac medications before your next appointment, please call your pharmacy.      Hilbert Corrigan, [place]  10/23/2017 1:30 PM    [facility] [address], [place], [place]  [address] Phone: [phone]; Fax: [phone]

## 2017-10-22 NOTE — Patient Instructions (Addendum)
Medication Instructions:  Your physician has recommended you make the following change in your medication:  1) STOP Amlodipine 2) START Spironolactone 12.5mg  daily. An Rx has been sent to your pharmacy 3) INCREASE Entresto to 97-103mg  bid. An Rx has been sent to your pharmacy   Labwork: Bmet today  Your physician recommends that you return for lab work in: 1 week ( repeat Bmet)   Testing/Procedures: Your physician has requested that you have an echocardiogram. Echocardiography is a painless test that uses sound waves to create images of your heart. It provides your doctor with information about the size and shape of your heart and how well your heart's chambers and valves are working. This procedure takes approximately one hour. There are no restrictions for this procedure. (To be scheduled the end of November 2019)  Follow-Up: Your physician recommends that you schedule a follow-up appointment with Dr.Hochrein after your echo  Follow up as planned with Dr.Kelly    Any Other Special Instructions Will Be Listed Below (If Applicable).     If you need a refill on your cardiac medications before your next appointment, please call your pharmacy.

## 2017-10-23 ENCOUNTER — Other Ambulatory Visit (HOSPITAL_COMMUNITY): Payer: Self-pay | Admitting: Psychiatry

## 2017-10-23 ENCOUNTER — Encounter: Payer: Self-pay | Admitting: Physician Assistant

## 2017-10-23 LAB — BASIC METABOLIC PANEL
BUN/Creatinine Ratio: 27 — ABNORMAL HIGH (ref 9–23)
BUN: 20 mg/dL (ref 6–24)
CO2: 25 mmol/L (ref 20–29)
Calcium: 9.6 mg/dL (ref 8.7–10.2)
Chloride: 102 mmol/L (ref 96–106)
Creatinine, Ser: 0.75 mg/dL (ref 0.57–1.00)
GFR calc Af Amer: 106 mL/min/{1.73_m2} (ref >59)
GFR calc non Af Amer: 92 mL/min/{1.73_m2} (ref >59)
Glucose: 103 mg/dL — ABNORMAL HIGH (ref 65–99)
Potassium: 4.6 mmol/L (ref 3.5–5.2)
Sodium: 142 mmol/L (ref 134–144)

## 2017-10-23 NOTE — Telephone Encounter (Signed)
The patient has been cleared by her Cardiologist for surgery, her bleeding has improved.  CC: Lamont Snowball, RN

## 2017-10-23 NOTE — Telephone Encounter (Signed)
Routing to Dr.

## 2017-10-24 NOTE — Telephone Encounter (Signed)
Call to patient. Advised we have received clearance for surgery from cardiology.  Patient states she has many medical appointments and will need to call back with her calendar to plan surgery date.

## 2017-10-24 NOTE — Telephone Encounter (Signed)
Patient returned call to Sally. °

## 2017-10-27 ENCOUNTER — Other Ambulatory Visit: Payer: Self-pay | Admitting: Cardiology

## 2017-10-28 ENCOUNTER — Telehealth: Payer: Self-pay | Admitting: *Deleted

## 2017-10-28 ENCOUNTER — Ambulatory Visit: Payer: Self-pay | Admitting: Family Medicine

## 2017-10-28 NOTE — Telephone Encounter (Signed)
Cardiology Clearance. Surgery scheduled for 10-31-17.  Encounter closed.

## 2017-10-28 NOTE — Patient Instructions (Addendum)
Your procedure is scheduled on: Friday October 31, 2017 at 10:15 am  Enter through the Main Entrance of [facility] at: 8:45 am  Pick up the phone at the desk and dial [phone].  Call this number if you have problems the morning of surgery: [phone].  Remember: Do NOT eat food or drink any liquids after: Midnight on Thursday September 5  Take these medicines the morning of surgery with a SIP OF WATER: BUSPAR, COREG, KLONOPIN, BENTYL, ENTRESOL, SERTRALINE, SIMVASTATIN  STOP ALL VITAMINS, SUPPLEMENTS, HERBAL MEDICATIONS NOW  DO NOT SMOKE DAY OF SURGERY  BRUSH YOUR TEETH DAY OF SURGERY  Do NOT wear jewelry (body piercing), metal hair clips/bobby pins, make-up, or nail polish. Do NOT wear lotions, powders, or perfumes.  You may wear deoderant. Do NOT shave for 48 hours prior to surgery. Do NOT bring valuables to the hospital. Contacts, dentures, or bridgework may not be worn into surgery.  Have a responsible adult drive you home and stay with you for 24 hours after your procedure

## 2017-10-28 NOTE — Telephone Encounter (Signed)
Call to patient to discuss surgery dates. Patient agreeable to proceed this Friday 10-31-17. Advised will schedule and call back once confirmed.

## 2017-10-28 NOTE — Telephone Encounter (Signed)
Call from patient. Confirmed surgery scheduled for 10-31-17 at 1000 at [facility]. Surgery instruction sheet reviewed.    Call to [facility] and left message requesting PAT appointment.   Routing to provider for final review.  Will close encounter.

## 2017-10-28 NOTE — Telephone Encounter (Signed)
-----   Message from [place], [place] sent at 10/22/2017 11:24 AM EDT ----- Regarding: RE: Cardiac clearance I have seen [name] today. Her breathing significantly improved after diuresis and medication adjustment. I cleared her for surgery. I have given her the clearance letter. You can also see the clearance letter under Letters section in Epic.   Thank you  Almyra Deforest  ----- Message ----- From: Almyra Deforest, [place] Sent: 10/14/2017   5:27 PM EDT To: Huey Romans, RN, Almyra Deforest, PA Subject: RE: Cardiac clearance                          I did see her, but she was so short of breath at the time I do not think she could undergo any surgery. I think she is stable from cardiac perspective, but it is the breathing I am quite concerned of. I plan to reassess her next week. Is this surgery urgent or can it be delayed? May want to clear with pulmonology as well given her shortness of breath.   Thanks  Almyra Deforest  ----- Message ----- From: Huey Romans, RN Sent: 10/08/2017   5:27 PM EDT To: Almyra Deforest, PA Subject: Cardiac clearance                              [name] is scheduled to see you on Thurs, 10-09-17 at 230.  Dr Sumner Boast is recommending Hysteroscopy/D&C for AUB and endometrial polyp. Due to her BMI, she is also at risk for endometrial hyperplasia.  Can you provide cardiac clearance and any additional instructions for potential surgery date of 10-21-17?   If you have questions, our number is [phone].  Thank you,  Lamont Snowball, RN for Dr Talbert Nan

## 2017-10-29 ENCOUNTER — Other Ambulatory Visit: Payer: Self-pay

## 2017-10-29 ENCOUNTER — Encounter (HOSPITAL_COMMUNITY): Payer: Self-pay

## 2017-10-29 ENCOUNTER — Encounter (HOSPITAL_COMMUNITY)
Admission: RE | Admit: 2017-10-29 | Discharge: 2017-10-29 | Disposition: A | Discharge status: Home / Self Care | Payer: Medicare Other | Source: Ambulatory Visit | Attending: Obstetrics and Gynecology | Admitting: Obstetrics and Gynecology

## 2017-10-29 DIAGNOSIS — N939 Abnormal uterine and vaginal bleeding, unspecified: Secondary | ICD-10-CM | POA: Diagnosis not present

## 2017-10-29 DIAGNOSIS — F988 Other specified behavioral and emotional disorders with onset usually occurring in childhood and adolescence: Secondary | ICD-10-CM | POA: Diagnosis not present

## 2017-10-29 DIAGNOSIS — Z6841 Body Mass Index (BMI) 40.0 and over, adult: Secondary | ICD-10-CM | POA: Diagnosis not present

## 2017-10-29 DIAGNOSIS — N879 Dysplasia of cervix uteri, unspecified: Secondary | ICD-10-CM | POA: Diagnosis not present

## 2017-10-29 DIAGNOSIS — E785 Hyperlipidemia, unspecified: Secondary | ICD-10-CM | POA: Diagnosis not present

## 2017-10-29 DIAGNOSIS — I5022 Chronic systolic (congestive) heart failure: Secondary | ICD-10-CM | POA: Diagnosis not present

## 2017-10-29 DIAGNOSIS — I110 Hypertensive heart disease with heart failure: Secondary | ICD-10-CM | POA: Diagnosis not present

## 2017-10-29 DIAGNOSIS — G4733 Obstructive sleep apnea (adult) (pediatric): Secondary | ICD-10-CM | POA: Diagnosis not present

## 2017-10-29 DIAGNOSIS — F329 Major depressive disorder, single episode, unspecified: Secondary | ICD-10-CM | POA: Diagnosis not present

## 2017-10-29 DIAGNOSIS — F419 Anxiety disorder, unspecified: Secondary | ICD-10-CM | POA: Diagnosis not present

## 2017-10-29 DIAGNOSIS — I429 Cardiomyopathy, unspecified: Secondary | ICD-10-CM | POA: Diagnosis not present

## 2017-10-29 DIAGNOSIS — Z87891 Personal history of nicotine dependence: Secondary | ICD-10-CM | POA: Diagnosis not present

## 2017-10-29 DIAGNOSIS — Z880 Allergy status to penicillin: Secondary | ICD-10-CM | POA: Diagnosis not present

## 2017-10-29 DIAGNOSIS — N841 Polyp of cervix uteri: Secondary | ICD-10-CM | POA: Diagnosis not present

## 2017-10-29 DIAGNOSIS — N840 Polyp of corpus uteri: Secondary | ICD-10-CM | POA: Diagnosis not present

## 2017-10-29 DIAGNOSIS — Z79899 Other long term (current) drug therapy: Secondary | ICD-10-CM | POA: Diagnosis not present

## 2017-10-29 DIAGNOSIS — Z888 Allergy status to other drugs, medicaments and biological substances status: Secondary | ICD-10-CM | POA: Diagnosis not present

## 2017-10-29 DIAGNOSIS — M170 Bilateral primary osteoarthritis of knee: Secondary | ICD-10-CM | POA: Diagnosis not present

## 2017-10-29 HISTORY — DX: Calculus of gallbladder without cholecystitis without obstruction: K80.20

## 2017-10-29 HISTORY — DX: Unspecified osteoarthritis, unspecified site: M19.90

## 2017-10-29 HISTORY — DX: Dyspnea, unspecified: R06.00

## 2017-10-29 LAB — BASIC METABOLIC PANEL
Anion gap: 8 (ref 5–15)
BUN: 18 mg/dL (ref 6–20)
CO2: 26 mmol/L (ref 22–32)
Calcium: 8.9 mg/dL (ref 8.9–10.3)
Chloride: 103 mmol/L (ref 98–111)
Creatinine, Ser: 0.78 mg/dL (ref 0.44–1.00)
GFR calc Af Amer: >60 mL/min (ref >60)
GFR calc non Af Amer: >60 mL/min (ref >60)
Glucose, Bld: 139 mg/dL — ABNORMAL HIGH (ref 70–99)
Potassium: 4.2 mmol/L (ref 3.5–5.1)
Sodium: 137 mmol/L (ref 135–145)

## 2017-10-29 LAB — CBC
HCT: 44.4 % (ref 36.0–46.0)
Hemoglobin: 14.4 g/dL (ref 12.0–15.0)
MCH: 28 pg (ref 26.0–34.0)
MCHC: 32.4 g/dL (ref 30.0–36.0)
MCV: 86.2 fL (ref 78.0–100.0)
Platelets: 217 10*3/uL (ref 150–400)
RBC: 5.15 MIL/uL — ABNORMAL HIGH (ref 3.87–5.11)
RDW: 15.7 % — ABNORMAL HIGH (ref 11.5–15.5)
WBC: 8.8 10*3/uL (ref 4.0–10.5)

## 2017-10-29 NOTE — Pre-Procedure Instructions (Signed)
Dr. Lanetta Inch  Viewed and okay' d EKG, aware of history and cardiac clearance in epic

## 2017-10-30 ENCOUNTER — Encounter: Payer: Self-pay | Admitting: Obstetrics and Gynecology

## 2017-10-30 ENCOUNTER — Ambulatory Visit (INDEPENDENT_AMBULATORY_CARE_PROVIDER_SITE_OTHER): Payer: Medicare Other | Admitting: Obstetrics and Gynecology

## 2017-10-30 ENCOUNTER — Other Ambulatory Visit: Payer: Self-pay

## 2017-10-30 ENCOUNTER — Telehealth: Payer: Self-pay

## 2017-10-30 VITALS — BP 138/82 | Wt 340.0 lb

## 2017-10-30 DIAGNOSIS — Z01818 Encounter for other preprocedural examination: Secondary | ICD-10-CM

## 2017-10-30 DIAGNOSIS — N939 Abnormal uterine and vaginal bleeding, unspecified: Secondary | ICD-10-CM

## 2017-10-30 DIAGNOSIS — Z79899 Other long term (current) drug therapy: Secondary | ICD-10-CM | POA: Diagnosis not present

## 2017-10-30 LAB — BASIC METABOLIC PANEL
BUN/Creatinine Ratio: 20 (ref 9–23)
BUN: 16 mg/dL (ref 6–24)
CO2: 24 mmol/L (ref 20–29)
Calcium: 9.2 mg/dL (ref 8.7–10.2)
Chloride: 100 mmol/L (ref 96–106)
Creatinine, Ser: 0.82 mg/dL (ref 0.57–1.00)
GFR calc Af Amer: 95 mL/min/{1.73_m2} (ref >59)
GFR calc non Af Amer: 83 mL/min/{1.73_m2} (ref >59)
Glucose: 168 mg/dL — ABNORMAL HIGH (ref 65–99)
Potassium: 4.3 mmol/L (ref 3.5–5.2)
Sodium: 140 mmol/L (ref 134–144)

## 2017-10-30 LAB — HEMOGLOBIN A1C
Hgb A1c MFr Bld: 6.5 % — ABNORMAL HIGH (ref 4.8–5.6)
Mean Plasma Glucose: 139.85 mg/dL

## 2017-10-30 NOTE — Telephone Encounter (Signed)
-----   Message from [place], [place] sent at 10/24/2017  4:33 PM EDT ----- Regarding: [name] Loll Please have patient check with her pharmacy on the cost of entresto, I discussed with our clinical pharmacist, if she does have dual medicare and medicaid, she should qualify for entresto at little or no cost. We may have to do a prior authorization if his pharmacy requires it.   Hilbert Corrigan PA Pager: [phone]

## 2017-10-30 NOTE — Progress Notes (Signed)
GYNECOLOGY  VISIT   HPI: 52 y.o.   Married  Caucasian  female   [id number] with No LMP recorded. (Menstrual status: Irregular Periods).   here for pre-op. The patient has a h/o abnormal uterine bleeding and possible polyp on sonohysterogram. She had a normal pap with negative hpv, a normal CBC, and a normal TSH. She has a h/o heart disease and has had clearance for surgery by her Cardiologist.   GYNECOLOGIC HISTORY: No LMP recorded. (Menstrual status: Irregular Periods). Contraception:none (not sexually active) Menopausal hormone therapy: NA        OB History    Gravida  2   Para  1   Term  1   Preterm      AB  1   Living  1     SAB      TAB  1   Ectopic      Multiple      Live Births                 Patient Active Problem List   Diagnosis Date Noted  . Prediabetes   . Acute on chronic systolic CHF (congestive heart failure) ([place]) 10/19/2016  . Hyperglycemia 10/19/2016  . SOB (shortness of breath) 06/22/2016  . Smoker 06/22/2016  . Normal coronary arteries 04/17/2016  . Obstructive sleep apnea syndrome 10/28/2014  . Major depressive disorder 10/28/2014  . Chronic systolic CHF (congestive heart failure), NYHA class 3 ([place]) 12/06/2013  . Dyslipidemia 04/04/2011  . Hypertension 06/03/2008  . Morbid obesity ([place]) 07/12/2006  . NICM (nonischemic cardiomyopathy) ([place]) 07/12/2006  . Attention deficit disorder 07/12/2006    Past Medical History:  Diagnosis Date  . ADD (attention deficit disorder)   . Anxiety   . Arthritis    both knees right worse than left  . Cardiomyopathy, peripartum, postpartum   . Carpal tunnel syndrome of right wrist   . CHF (congestive heart failure) ([place])   . Chronic systolic CHF (congestive heart failure), NYHA class 3 ([place]) 12/06/2013  . Depression   . Dyspnea   . Essential hypertension, benign   . Gallstones   . Migraines   . Morbid obesity ([place])   . OSA (obstructive sleep apnea)   . Plantar fasciitis   . Pre-diabetes   .  Prediabetes   . Spinal stenosis   . Spinal stenosis    back pain    Past Surgical History:  Procedure Laterality Date  . CHOLECYSTECTOMY    . RIGHT/LEFT HEART CATH AND CORONARY ANGIOGRAPHY N/A 04/11/2016   Procedure: Right/Left Heart Cath and Coronary Angiography;  Surgeon: Burnell Blanks, MD;  Location: [facility];  Service: Cardiovascular;  Laterality: N/A;  . sonogram for blood clots     no blockages    Current Outpatient Medications  Medication Sig Dispense Refill  . amLODipine (NORVASC) 10 MG tablet TAKE 1 TABLET BY MOUTH EVERY DAY 90 tablet 1  . busPIRone (BUSPAR) 10 MG tablet Take 1 tablet (10 mg total) by mouth 2 (two) times daily. 60 tablet 1  . carvedilol (COREG) 25 MG tablet Take 1 tablet (25 mg total) by mouth 2 (two) times daily with a meal. (Patient taking differently: Take 37.5 mg by mouth 2 (two) times daily with a meal. ) 180 tablet 3  . clonazePAM (KLONOPIN) 1 MG tablet Take .5 mg qd prn anxiety 21 tablet 1  . clotrimazole-betamethasone (LOTRISONE) cream Apply 1 application topically 2 (two) times daily. 45 g 2  . COREG 12.5  MG tablet Take 1 tablet (12.5 mg total) by mouth 2 (two) times daily with a meal. (Patient taking differently: Take 37.5 mg by mouth 2 (two) times daily with a meal. ) 180 tablet 3  . dicyclomine (BENTYL) 20 MG tablet Take 1 tablet (20 mg total) by mouth every 6 (six) hours. 30 tablet 0  . furosemide (LASIX) 40 MG tablet Take 1 tablet (40 mg total) by mouth 2 (two) times daily. Take as directed 30 tablet 0  . ibuprofen (ADVIL,MOTRIN) 800 MG tablet TAKE 1 TABLET (800 MG TOTAL) BY MOUTH EVERY 8 (EIGHT) HOURS AS NEEDED FOR HEADACHE OR MILD PAIN. 14 tablet 0  . lamoTRIgine (LAMICTAL) 200 MG tablet Take 1 tablet (200 mg total) by mouth daily. 30 tablet 1  . potassium chloride SA (KLOR-CON M20) 20 MEQ tablet Take 1 tablet (20 mEq total) by mouth daily. 180 tablet 3  . Psyllium (NAT-RUL PSYLLIUM SEED HUSKS) 500 MG CAPS 1-2 po qid as needed  240 each 2  . sacubitril-valsartan (ENTRESTO) 97-103 MG Take 1 tablet by mouth 2 (two) times daily. 180 tablet 1  . sertraline (ZOLOFT) 100 MG tablet Take two tablets PO QDAY 60 tablet 1  . simvastatin (ZOCOR) 20 MG tablet TAKE 1 TABLET BY MOUTH EVERY DAY 90 tablet 2  . spironolactone (ALDACTONE) 25 MG tablet Take 0.5 tablets (12.5 mg total) by mouth daily. 45 tablet 3   No current facility-administered medications for this visit.      ALLERGIES: Fluoxetine; Cymbalta [duloxetine hcl]; and Penicillins  Fluoxetine and cymbalta worsen depression. Rash with PCN  Family History  Problem Relation Age of Onset  . Depression Mother   . Anxiety disorder Mother   . Diabetes Mother   . Hypertension Mother   . ADD / ADHD Father   . Diabetes Father   . Hypertension Father   . Hypertension Other   . Diabetes Other   . Colitis Other   . Alcohol abuse Other     Social History   Socioeconomic History  . Marital status: Married    Spouse name: Not on file  . Number of children: Not on file  . Years of education: Not on file  . Highest education level: Not on file  Occupational History  . Not on file  Social Needs  . Financial resource strain: Not on file  . Food insecurity:    Worry: Not on file    Inability: Not on file  . Transportation needs:    Medical: Not on file    Non-medical: Not on file  Tobacco Use  . Smoking status: Former Smoker    Packs/day: 0.50    Types: Cigarettes    Last attempt to quit: 02/25/1997    Years since quitting: 20.6  . Smokeless tobacco: Never Used  . Tobacco comment: Married, lives with spouse (when he is not traveling) and son  Substance and Sexual Activity  . Alcohol use: No  . Drug use: No  . Sexual activity: Yes    Partners: Male    Birth control/protection: None  Lifestyle  . Physical activity:    Days per week: Not on file    Minutes per session: Not on file  . Stress: Not on file  Relationships  . Social connections:    Talks on  phone: Not on file    Gets together: Not on file    Attends religious service: Not on file    Active member of club or organization: Not on  file    Attends meetings of clubs or organizations: Not on file    Relationship status: Not on file  . Intimate partner violence:    Fear of current or ex partner: Not on file    Emotionally abused: Not on file    Physically abused: Not on file    Forced sexual activity: Not on file  Other Topics Concern  . Not on file  Social History Narrative  . Not on file    Review of Systems  Constitutional: Negative.   HENT: Negative.   Eyes: Negative.   Respiratory: Negative.   Cardiovascular: Positive for leg swelling.  Gastrointestinal: Positive for diarrhea.  Genitourinary: Negative.   Musculoskeletal: Positive for myalgias.  Skin: Negative.   Neurological: Negative.   Endo/Heme/Allergies: Negative.   Psychiatric/Behavioral: The patient is nervous/anxious.   All other systems reviewed and are negative. She has IBS, goes from constipation to diarrhea. No change  PHYSICAL EXAMINATION:    BP 138/82   Wt (!) 340 lb (154.2 kg)   BMI 60.23 kg/m     General appearance: alert, cooperative and appears stated age Neck: no adenopathy, supple, symmetrical, trachea midline and thyroid normal to inspection and palpation Heart: regular rate and rhythm Lungs: CTAB Abdomen: obese, soft, non-tender; bowel sounds normal; no masses,  no organomegaly Extremities: normal, atraumatic, no cyanosis Skin: normal color, texture and turgor, no rashes or lesions Lymph: normal cervical supraclavicular and inguinal nodes Neurologic: grossly normal   ASSESSMENT Morbidly obese female with multiple medical issues presented with AUB and possible endometrial polyp of sonohysterogram. Normal CBC, TSH, pap.  H/O heart disease and COPD    PLAN She has been cleared for surgery by her Cardiologist Plan: hysteroscopy, polypectomy, dilation and curettage. Reviewed risks,  including: bleeding, infection, uterine perforation, fluid overload, need for further sugery Check UPT today (not sexually active for over a month). Patient unable to void, will check UPT prior to surgery tomorrow.    An After Visit Summary was printed and given to the patient.   CC: Dr Juleen China, Dr Percival Spanish

## 2017-10-30 NOTE — H&P (View-Only) (Signed)
GYNECOLOGY  VISIT   HPI: 52 y.o.   Married  Caucasian  female   [id number] with No LMP recorded. (Menstrual status: Irregular Periods).   here for pre-op. The patient has a h/o abnormal uterine bleeding and possible polyp on sonohysterogram. She had a normal pap with negative hpv, a normal CBC, and a normal TSH. She has a h/o heart disease and has had clearance for surgery by her Cardiologist.   GYNECOLOGIC HISTORY: No LMP recorded. (Menstrual status: Irregular Periods). Contraception:none (not sexually active) Menopausal hormone therapy: NA        OB History    Gravida  2   Para  1   Term  1   Preterm      AB  1   Living  1     SAB      TAB  1   Ectopic      Multiple      Live Births                 Patient Active Problem List   Diagnosis Date Noted  . Prediabetes   . Acute on chronic systolic CHF (congestive heart failure) ([place]) 10/19/2016  . Hyperglycemia 10/19/2016  . SOB (shortness of breath) 06/22/2016  . Smoker 06/22/2016  . Normal coronary arteries 04/17/2016  . Obstructive sleep apnea syndrome 10/28/2014  . Major depressive disorder 10/28/2014  . Chronic systolic CHF (congestive heart failure), NYHA class 3 ([place]) 12/06/2013  . Dyslipidemia 04/04/2011  . Hypertension 06/03/2008  . Morbid obesity ([place]) 07/12/2006  . NICM (nonischemic cardiomyopathy) ([place]) 07/12/2006  . Attention deficit disorder 07/12/2006    Past Medical History:  Diagnosis Date  . ADD (attention deficit disorder)   . Anxiety   . Arthritis    both knees right worse than left  . Cardiomyopathy, peripartum, postpartum   . Carpal tunnel syndrome of right wrist   . CHF (congestive heart failure) ([place])   . Chronic systolic CHF (congestive heart failure), NYHA class 3 ([place]) 12/06/2013  . Depression   . Dyspnea   . Essential hypertension, benign   . Gallstones   . Migraines   . Morbid obesity ([place])   . OSA (obstructive sleep apnea)   . Plantar fasciitis   . Pre-diabetes   .  Prediabetes   . Spinal stenosis   . Spinal stenosis    back pain    Past Surgical History:  Procedure Laterality Date  . CHOLECYSTECTOMY    . RIGHT/LEFT HEART CATH AND CORONARY ANGIOGRAPHY N/A 04/11/2016   Procedure: Right/Left Heart Cath and Coronary Angiography;  Surgeon: Burnell Blanks, MD;  Location: [facility];  Service: Cardiovascular;  Laterality: N/A;  . sonogram for blood clots     no blockages    Current Outpatient Medications  Medication Sig Dispense Refill  . amLODipine (NORVASC) 10 MG tablet TAKE 1 TABLET BY MOUTH EVERY DAY 90 tablet 1  . busPIRone (BUSPAR) 10 MG tablet Take 1 tablet (10 mg total) by mouth 2 (two) times daily. 60 tablet 1  . carvedilol (COREG) 25 MG tablet Take 1 tablet (25 mg total) by mouth 2 (two) times daily with a meal. (Patient taking differently: Take 37.5 mg by mouth 2 (two) times daily with a meal. ) 180 tablet 3  . clonazePAM (KLONOPIN) 1 MG tablet Take .5 mg qd prn anxiety 21 tablet 1  . clotrimazole-betamethasone (LOTRISONE) cream Apply 1 application topically 2 (two) times daily. 45 g 2  . COREG 12.5  MG tablet Take 1 tablet (12.5 mg total) by mouth 2 (two) times daily with a meal. (Patient taking differently: Take 37.5 mg by mouth 2 (two) times daily with a meal. ) 180 tablet 3  . dicyclomine (BENTYL) 20 MG tablet Take 1 tablet (20 mg total) by mouth every 6 (six) hours. 30 tablet 0  . furosemide (LASIX) 40 MG tablet Take 1 tablet (40 mg total) by mouth 2 (two) times daily. Take as directed 30 tablet 0  . ibuprofen (ADVIL,MOTRIN) 800 MG tablet TAKE 1 TABLET (800 MG TOTAL) BY MOUTH EVERY 8 (EIGHT) HOURS AS NEEDED FOR HEADACHE OR MILD PAIN. 14 tablet 0  . lamoTRIgine (LAMICTAL) 200 MG tablet Take 1 tablet (200 mg total) by mouth daily. 30 tablet 1  . potassium chloride SA (KLOR-CON M20) 20 MEQ tablet Take 1 tablet (20 mEq total) by mouth daily. 180 tablet 3  . Psyllium (NAT-RUL PSYLLIUM SEED HUSKS) 500 MG CAPS 1-2 po qid as needed  240 each 2  . sacubitril-valsartan (ENTRESTO) 97-103 MG Take 1 tablet by mouth 2 (two) times daily. 180 tablet 1  . sertraline (ZOLOFT) 100 MG tablet Take two tablets PO QDAY 60 tablet 1  . simvastatin (ZOCOR) 20 MG tablet TAKE 1 TABLET BY MOUTH EVERY DAY 90 tablet 2  . spironolactone (ALDACTONE) 25 MG tablet Take 0.5 tablets (12.5 mg total) by mouth daily. 45 tablet 3   No current facility-administered medications for this visit.      ALLERGIES: Fluoxetine; Cymbalta [duloxetine hcl]; and Penicillins  Fluoxetine and cymbalta worsen depression. Rash with PCN  Family History  Problem Relation Age of Onset  . Depression Mother   . Anxiety disorder Mother   . Diabetes Mother   . Hypertension Mother   . ADD / ADHD Father   . Diabetes Father   . Hypertension Father   . Hypertension Other   . Diabetes Other   . Colitis Other   . Alcohol abuse Other     Social History   Socioeconomic History  . Marital status: Married    Spouse name: Not on file  . Number of children: Not on file  . Years of education: Not on file  . Highest education level: Not on file  Occupational History  . Not on file  Social Needs  . Financial resource strain: Not on file  . Food insecurity:    Worry: Not on file    Inability: Not on file  . Transportation needs:    Medical: Not on file    Non-medical: Not on file  Tobacco Use  . Smoking status: Former Smoker    Packs/day: 0.50    Types: Cigarettes    Last attempt to quit: 02/25/1997    Years since quitting: 20.6  . Smokeless tobacco: Never Used  . Tobacco comment: Married, lives with spouse (when he is not traveling) and son  Substance and Sexual Activity  . Alcohol use: No  . Drug use: No  . Sexual activity: Yes    Partners: Male    Birth control/protection: None  Lifestyle  . Physical activity:    Days per week: Not on file    Minutes per session: Not on file  . Stress: Not on file  Relationships  . Social connections:    Talks on  phone: Not on file    Gets together: Not on file    Attends religious service: Not on file    Active member of club or organization: Not on  file    Attends meetings of clubs or organizations: Not on file    Relationship status: Not on file  . Intimate partner violence:    Fear of current or ex partner: Not on file    Emotionally abused: Not on file    Physically abused: Not on file    Forced sexual activity: Not on file  Other Topics Concern  . Not on file  Social History Narrative  . Not on file    Review of Systems  Constitutional: Negative.   HENT: Negative.   Eyes: Negative.   Respiratory: Negative.   Cardiovascular: Positive for leg swelling.  Gastrointestinal: Positive for diarrhea.  Genitourinary: Negative.   Musculoskeletal: Positive for myalgias.  Skin: Negative.   Neurological: Negative.   Endo/Heme/Allergies: Negative.   Psychiatric/Behavioral: The patient is nervous/anxious.   All other systems reviewed and are negative. She has IBS, goes from constipation to diarrhea. No change  PHYSICAL EXAMINATION:    BP 138/82   Wt (!) 340 lb (154.2 kg)   BMI 60.23 kg/m     General appearance: alert, cooperative and appears stated age Neck: no adenopathy, supple, symmetrical, trachea midline and thyroid normal to inspection and palpation Heart: regular rate and rhythm Lungs: CTAB Abdomen: obese, soft, non-tender; bowel sounds normal; no masses,  no organomegaly Extremities: normal, atraumatic, no cyanosis Skin: normal color, texture and turgor, no rashes or lesions Lymph: normal cervical supraclavicular and inguinal nodes Neurologic: grossly normal   ASSESSMENT Morbidly obese female with multiple medical issues presented with AUB and possible endometrial polyp of sonohysterogram. Normal CBC, TSH, pap.  H/O heart disease and COPD    PLAN She has been cleared for surgery by her Cardiologist Plan: hysteroscopy, polypectomy, dilation and curettage. Reviewed risks,  including: bleeding, infection, uterine perforation, fluid overload, need for further sugery Check UPT today (not sexually active for over a month). Patient unable to void, will check UPT prior to surgery tomorrow.    An After Visit Summary was printed and given to the patient.   CC: Dr Juleen China, Dr Percival Spanish

## 2017-10-30 NOTE — Telephone Encounter (Signed)
Called patient but her mailbox is full.   

## 2017-10-31 ENCOUNTER — Ambulatory Visit (HOSPITAL_COMMUNITY): Payer: Medicare Other | Admitting: Certified Registered Nurse Anesthetist

## 2017-10-31 ENCOUNTER — Encounter (HOSPITAL_COMMUNITY)
Admission: RE | Disposition: A | Discharge status: Home / Self Care | Payer: Self-pay | Source: Ambulatory Visit | Attending: Obstetrics and Gynecology

## 2017-10-31 ENCOUNTER — Ambulatory Visit (HOSPITAL_COMMUNITY)
Admission: RE | Admit: 2017-10-31 | Discharge: 2017-10-31 | Disposition: A | Discharge status: Home / Self Care | Payer: Medicare Other | Source: Ambulatory Visit | Attending: Obstetrics and Gynecology | Admitting: Obstetrics and Gynecology

## 2017-10-31 ENCOUNTER — Encounter (HOSPITAL_COMMUNITY): Payer: Self-pay

## 2017-10-31 DIAGNOSIS — Z888 Allergy status to other drugs, medicaments and biological substances status: Secondary | ICD-10-CM | POA: Insufficient documentation

## 2017-10-31 DIAGNOSIS — N939 Abnormal uterine and vaginal bleeding, unspecified: Secondary | ICD-10-CM | POA: Diagnosis not present

## 2017-10-31 DIAGNOSIS — F329 Major depressive disorder, single episode, unspecified: Secondary | ICD-10-CM | POA: Insufficient documentation

## 2017-10-31 DIAGNOSIS — I429 Cardiomyopathy, unspecified: Secondary | ICD-10-CM | POA: Diagnosis not present

## 2017-10-31 DIAGNOSIS — F988 Other specified behavioral and emotional disorders with onset usually occurring in childhood and adolescence: Secondary | ICD-10-CM | POA: Insufficient documentation

## 2017-10-31 DIAGNOSIS — Z6841 Body Mass Index (BMI) 40.0 and over, adult: Secondary | ICD-10-CM | POA: Insufficient documentation

## 2017-10-31 DIAGNOSIS — E785 Hyperlipidemia, unspecified: Secondary | ICD-10-CM | POA: Insufficient documentation

## 2017-10-31 DIAGNOSIS — I110 Hypertensive heart disease with heart failure: Secondary | ICD-10-CM | POA: Diagnosis not present

## 2017-10-31 DIAGNOSIS — G4733 Obstructive sleep apnea (adult) (pediatric): Secondary | ICD-10-CM | POA: Diagnosis not present

## 2017-10-31 DIAGNOSIS — Z880 Allergy status to penicillin: Secondary | ICD-10-CM | POA: Insufficient documentation

## 2017-10-31 DIAGNOSIS — N840 Polyp of corpus uteri: Secondary | ICD-10-CM | POA: Diagnosis not present

## 2017-10-31 DIAGNOSIS — F419 Anxiety disorder, unspecified: Secondary | ICD-10-CM | POA: Insufficient documentation

## 2017-10-31 DIAGNOSIS — Z79899 Other long term (current) drug therapy: Secondary | ICD-10-CM | POA: Insufficient documentation

## 2017-10-31 DIAGNOSIS — I5022 Chronic systolic (congestive) heart failure: Secondary | ICD-10-CM | POA: Diagnosis not present

## 2017-10-31 DIAGNOSIS — N848 Polyp of other parts of female genital tract: Secondary | ICD-10-CM | POA: Diagnosis not present

## 2017-10-31 DIAGNOSIS — N841 Polyp of cervix uteri: Secondary | ICD-10-CM | POA: Insufficient documentation

## 2017-10-31 DIAGNOSIS — N879 Dysplasia of cervix uteri, unspecified: Secondary | ICD-10-CM | POA: Insufficient documentation

## 2017-10-31 DIAGNOSIS — D689 Coagulation defect, unspecified: Secondary | ICD-10-CM

## 2017-10-31 DIAGNOSIS — Z87891 Personal history of nicotine dependence: Secondary | ICD-10-CM | POA: Insufficient documentation

## 2017-10-31 DIAGNOSIS — M170 Bilateral primary osteoarthritis of knee: Secondary | ICD-10-CM | POA: Insufficient documentation

## 2017-10-31 HISTORY — PX: DILATATION & CURETTAGE/HYSTEROSCOPY WITH MYOSURE: SHX6511

## 2017-10-31 LAB — GLUCOSE, CAPILLARY: Glucose-Capillary: 122 mg/dL — ABNORMAL HIGH (ref 70–99)

## 2017-10-31 LAB — PREGNANCY, URINE: Preg Test, Ur: NEGATIVE

## 2017-10-31 SURGERY — DILATATION & CURETTAGE/HYSTEROSCOPY WITH MYOSURE
Anesthesia: General | Site: Vagina

## 2017-10-31 MED ORDER — PROMETHAZINE HCL 25 MG/ML IJ SOLN: 6.2500 mg | INTRAMUSCULAR | Status: DC | PRN

## 2017-10-31 MED ORDER — LACTATED RINGERS IV SOLN
INTRAVENOUS | Status: DC
  Administered 2017-10-31: 75 mL/h via INTRAVENOUS

## 2017-10-31 MED ORDER — OXYCODONE HCL 5 MG PO TABS: 5.0000 mg | ORAL_TABLET | Freq: Once | ORAL | Status: DC | PRN

## 2017-10-31 MED ORDER — DEXAMETHASONE SODIUM PHOSPHATE 10 MG/ML IJ SOLN
INTRAMUSCULAR | Status: DC | PRN
  Administered 2017-10-31: 4 mg via INTRAVENOUS

## 2017-10-31 MED ORDER — SOD CITRATE-CITRIC ACID 500-334 MG/5ML PO SOLN
30.0000 mL | ORAL | Status: AC
  Administered 2017-10-31: 30 mL via ORAL

## 2017-10-31 MED ORDER — LIDOCAINE HCL (CARDIAC) PF 100 MG/5ML IV SOSY
PREFILLED_SYRINGE | INTRAVENOUS | Status: DC | PRN
  Administered 2017-10-31: 75 mg via INTRAVENOUS

## 2017-10-31 MED ORDER — PROPOFOL 10 MG/ML IV BOLUS
INTRAVENOUS | Status: DC | PRN
  Administered 2017-10-31: 150 mg via INTRAVENOUS

## 2017-10-31 MED ORDER — FENTANYL CITRATE (PF) 100 MCG/2ML IJ SOLN
INTRAMUSCULAR | Status: DC | PRN
  Administered 2017-10-31 (×2): 50 ug via INTRAVENOUS

## 2017-10-31 MED ORDER — OXYCODONE HCL 5 MG/5ML PO SOLN: 5.0000 mg | Freq: Once | ORAL | Status: DC | PRN

## 2017-10-31 MED ORDER — MEPERIDINE HCL 25 MG/ML IJ SOLN: 6.2500 mg | INTRAMUSCULAR | Status: DC | PRN

## 2017-10-31 MED ORDER — MIDAZOLAM HCL 2 MG/2ML IJ SOLN
INTRAMUSCULAR | Status: AC
  Filled 2017-10-31: qty 2

## 2017-10-31 MED ORDER — SCOPOLAMINE 1 MG/3DAYS TD PT72
1.0000 | MEDICATED_PATCH | Freq: Once | TRANSDERMAL | Status: DC
  Administered 2017-10-31: 1.5 mg via TRANSDERMAL

## 2017-10-31 MED ORDER — HYDROMORPHONE HCL 1 MG/ML IJ SOLN: 0.2500 mg | INTRAMUSCULAR | Status: DC | PRN

## 2017-10-31 MED ORDER — KETOROLAC TROMETHAMINE 30 MG/ML IJ SOLN
INTRAMUSCULAR | Status: AC
  Filled 2017-10-31: qty 1

## 2017-10-31 MED ORDER — KETOROLAC TROMETHAMINE 30 MG/ML IJ SOLN
INTRAMUSCULAR | Status: DC | PRN
  Administered 2017-10-31: 30 mg via INTRAVENOUS

## 2017-10-31 MED ORDER — SCOPOLAMINE 1 MG/3DAYS TD PT72
MEDICATED_PATCH | TRANSDERMAL | Status: AC
  Administered 2017-10-31: 1.5 mg via TRANSDERMAL
  Filled 2017-10-31: qty 1

## 2017-10-31 MED ORDER — PROPOFOL 10 MG/ML IV BOLUS
INTRAVENOUS | Status: AC
  Filled 2017-10-31: qty 20

## 2017-10-31 MED ORDER — ONDANSETRON HCL 4 MG/2ML IJ SOLN
INTRAMUSCULAR | Status: AC
  Filled 2017-10-31: qty 2

## 2017-10-31 MED ORDER — MIDAZOLAM HCL 2 MG/2ML IJ SOLN
INTRAMUSCULAR | Status: DC | PRN
  Administered 2017-10-31: 2 mg via INTRAVENOUS

## 2017-10-31 MED ORDER — ONDANSETRON HCL 4 MG/2ML IJ SOLN
INTRAMUSCULAR | Status: DC | PRN
  Administered 2017-10-31: 4 mg via INTRAVENOUS

## 2017-10-31 MED ORDER — SOD CITRATE-CITRIC ACID 500-334 MG/5ML PO SOLN
ORAL | Status: AC
  Administered 2017-10-31: 30 mL via ORAL
  Filled 2017-10-31: qty 15

## 2017-10-31 MED ORDER — FENTANYL CITRATE (PF) 100 MCG/2ML IJ SOLN
INTRAMUSCULAR | Status: AC
  Filled 2017-10-31: qty 2

## 2017-10-31 MED ORDER — DEXAMETHASONE SODIUM PHOSPHATE 4 MG/ML IJ SOLN
INTRAMUSCULAR | Status: AC
  Filled 2017-10-31: qty 1

## 2017-10-31 SURGICAL SUPPLY — 16 items
CANISTER SUCT 3000ML PPV (MISCELLANEOUS) ×2 IMPLANT
CATH ROBINSON RED A/P 16FR (CATHETERS) IMPLANT
DEVICE MYOSURE LITE (MISCELLANEOUS) IMPLANT
DEVICE MYOSURE REACH (MISCELLANEOUS) IMPLANT
FILTER ARTHROSCOPY CONVERTOR (FILTER) ×2 IMPLANT
GLOVE BIOGEL PI IND STRL 7.0 (GLOVE) ×2 IMPLANT
GLOVE BIOGEL PI INDICATOR 7.0 (GLOVE) ×2
GLOVE ECLIPSE 6.5 STRL STRAW (GLOVE) ×2 IMPLANT
GOWN STRL REUS W/TWL LRG LVL3 (GOWN DISPOSABLE) ×4 IMPLANT
PACK VAGINAL MINOR WOMEN LF (CUSTOM PROCEDURE TRAY) ×2 IMPLANT
PAD OB MATERNITY 4.3X12.25 (PERSONAL CARE ITEMS) ×2 IMPLANT
SEAL ROD LENS SCOPE MYOSURE (ABLATOR) ×2 IMPLANT
SYR 20CC LL (SYRINGE) IMPLANT
TOWEL OR 17X24 6PK STRL BLUE (TOWEL DISPOSABLE) ×4 IMPLANT
TUBING AQUILEX INFLOW (TUBING) ×2 IMPLANT
TUBING AQUILEX OUTFLOW (TUBING) ×2 IMPLANT

## 2017-10-31 NOTE — Anesthesia Postprocedure Evaluation (Signed)
Anesthesia Post Note  Patient: [name]  Procedure(s) Performed: DILATATION & CURETTAGE/HYSTEROSCOPY WITH MYOSURE (N/A Vagina )     Patient location during evaluation: PACU Anesthesia Type: General Level of consciousness: awake and alert Pain management: pain level controlled Vital Signs Assessment: post-procedure vital signs reviewed and stable Respiratory status: spontaneous breathing, nonlabored ventilation and respiratory function stable Cardiovascular status: blood pressure returned to baseline and stable Postop Assessment: no apparent nausea or vomiting Anesthetic complications: no    Last Vitals:  Vitals:   10/31/17 1152 10/31/17 1155  BP:    Pulse: 71 72  Resp: 17 11  Temp: 36.7 C   SpO2: 94% 97%    Last Pain:  Vitals:   10/31/17 1152  TempSrc: Oral  PainSc:    Pain Goal: Patients Stated Pain Goal: 2 (10/31/17 0909)               Lynda Rainwater

## 2017-10-31 NOTE — Anesthesia Preprocedure Evaluation (Signed)
Anesthesia Evaluation  Patient identified by MRN, date of birth, ID band Patient awake    Reviewed: Allergy & Precautions, NPO status , Patient's Chart, lab work & pertinent test results, reviewed documented beta blocker date and time   Airway Mallampati: II  TM Distance: >3 FB Neck ROM: Full    Dental no notable dental hx.    Pulmonary neg pulmonary ROS, shortness of breath, sleep apnea , former smoker,    Pulmonary exam normal breath sounds clear to auscultation       Cardiovascular hypertension, Pt. on medications and Pt. on home beta blockers +CHF  negative cardio ROS Normal cardiovascular exam Rhythm:Regular Rate:Normal     Neuro/Psych  Headaches, Anxiety Depression negative neurological ROS  negative psych ROS   GI/Hepatic negative GI ROS, Neg liver ROS,   Endo/Other  negative endocrine ROSMorbid obesity  Renal/GU negative Renal ROS  negative genitourinary   Musculoskeletal negative musculoskeletal ROS (+) Arthritis , Osteoarthritis,    Abdominal (+) + obese,   Peds negative pediatric ROS (+)  Hematology negative hematology ROS (+)   Anesthesia Other Findings   Reproductive/Obstetrics negative OB ROS                             Anesthesia Physical Anesthesia Plan  ASA: III  Anesthesia Plan: General   Post-op Pain Management:    Induction: Intravenous  PONV Risk Score and Plan: 3 and Ondansetron, Dexamethasone and Midazolam  Airway Management Planned: LMA  Additional Equipment:   Intra-op Plan:   Post-operative Plan: Extubation in OR  Informed Consent: I have reviewed the patients History and Physical, chart, labs and discussed the procedure including the risks, benefits and alternatives for the proposed anesthesia with the patient or authorized representative who has indicated his/her understanding and acceptance.   Dental advisory given  Plan Discussed with:  CRNA  Anesthesia Plan Comments:         Anesthesia Quick Evaluation

## 2017-10-31 NOTE — Transfer of Care (Signed)
Immediate Anesthesia Transfer of Care Note  Patient: [name]  Procedure(s) Performed: DILATATION & CURETTAGE/HYSTEROSCOPY WITH MYOSURE (N/A Vagina )  Patient Location: PACU  Anesthesia Type:General  Level of Consciousness: awake, alert  and oriented  Airway & Oxygen Therapy: Patient Spontanous Breathing and Patient connected to nasal cannula oxygen  Post-op Assessment: Report given to RN and Post -op Vital signs reviewed and stable  Post vital signs: Reviewed and stable  Last Vitals:  Vitals Value Taken Time  BP 114/71 10/31/2017 11:00 AM  Temp    Pulse 64 10/31/2017 11:05 AM  Resp 19 10/31/2017 11:05 AM  SpO2 93 % 10/31/2017 11:05 AM  Vitals shown include unvalidated device data.  Last Pain:  Vitals:   10/31/17 0909  TempSrc: Oral  PainSc: 6       Patients Stated Pain Goal: 2 (28/20/60 1561)  Complications: No apparent anesthesia complications

## 2017-10-31 NOTE — Anesthesia Procedure Notes (Signed)
Procedure Name: LMA Insertion Date/Time: 10/31/2017 10:16 AM Performed by: Bufford Spikes, CRNA Pre-anesthesia Checklist: Patient identified, Emergency Drugs available, Suction available and Patient being monitored Patient Re-evaluated:Patient Re-evaluated prior to induction Oxygen Delivery Method: Circle system utilized Preoxygenation: Pre-oxygenation with 100% oxygen Induction Type: IV induction Ventilation: Mask ventilation without difficulty LMA: LMA inserted LMA Size: 4.0 Number of attempts: 1 Airway Equipment and Method: Bite block Placement Confirmation: positive ETCO2 Tube secured with: Tape Dental Injury: Teeth and Oropharynx as per pre-operative assessment

## 2017-10-31 NOTE — Op Note (Signed)
Preoperative Diagnosis: Abnormal uterine bleeding  Postoperative Diagnosis: same, endometrial and endocervical polyps  Procedure: Hysteroscopy, polypectomy, dilation and curettage  Surgeon: Dr Sumner Boast  Assistants: None  Anesthesia: General via LMA  EBL: 5 cc  Fluids: 750 cc LR  Fluid deficit: 160 cc  Urine output: not recorded  Indications for surgery: The patient is a 52 yo female, who presented with abnormal uterine bleeding. Work up included a normal CBC, normal TSH, possible polyp on sonohysterogram.  The risks of the surgery were reviewed with the patient and the consent form was signed prior to her surgery.  Findings: EUA: normal sized uterus. No adnexal masses noted. Hysteroscopy: endocervical polyps, sessile endometrial polyp, normal tubal ostia bilaterally, thickened endometrium  Specimens: endocervical polyp, endometrial polyp and endocervical polyp, endocervical curettage, endometrial curettage   Procedure: The patient was taken to the operating room with an IV in place. She was placed in the dorsal lithotomy position and anesthesia was administered. She was prepped and draped in the usual sterile fashion for a vaginal procedure. She voided on the way to the OR. A weighted speculum was placed in the vagina and a single tooth tenaculum was placed on the anterior lip of the cervix. A portion of cervical polyp was removed with the polyp forceps. The cervix was dilated to a #7 hagar dilator. The uterus was sounded to 8 cm. The myosure hysteroscope was inserted into the uterine cavity. With continuous infusion of normal saline, the uterine cavity was visualized with the above findings. The myosure reach was used to remove the endocervical and endometrial polyps. The myosure was then removed. The cavity was then curetted with the small sharp curette. The cavity had the characteristically gritty texture at the end of the procedure. The curette and the single tooth tenaculum were  removed. The speculum was removed. The patients perineum was cleansed of betadine and she was taken out of the dorsal lithotomy position.  Upon awakening the LMA was removed and the patient was transferred to the recovery room in stable and awake condition.  The sponge and instrument count were correct. There were no complications.    CC: Dr Juleen China, Dr Percival Spanish

## 2017-10-31 NOTE — Discharge Instructions (Signed)
DISCHARGE INSTRUCTIONS: D&C / D&E °The following instructions have been prepared to help you care for yourself upon your return home. °  °Personal hygiene: °• Use sanitary pads for vaginal drainage, not tampons. °• Shower the day after your procedure. °• NO tub baths, pools or Jacuzzis for 2-3 weeks. °• Wipe front to back after using the bathroom. ° °Activity and limitations: °• Do NOT drive or operate any equipment for 24 hours. The effects of anesthesia are still present and drowsiness may result. °• Do NOT rest in bed all day. °• Walking is encouraged. °• Walk up and down stairs slowly. °• You may resume your normal activity in one to two days or as indicated by your physician. ° °Sexual activity: NO intercourse for at least 2 weeks after the procedure, or as indicated by your physician. ° °Diet: Eat a light meal as desired this evening. You may resume your usual diet tomorrow. ° °Return to work: You may resume your work activities in one to two days or as indicated by your doctor. ° °What to expect after your surgery: Expect to have vaginal bleeding/discharge for 2-3 days and spotting for up to 10 days. It is not unusual to have soreness for up to 1-2 weeks. You may have a slight burning sensation when you urinate for the first day. Mild cramps may continue for a couple of days. You may have a regular period in 2-6 weeks. ° °Call your doctor for any of the following: °• Excessive vaginal bleeding, saturating and changing one pad every hour. °• Inability to urinate 6 hours after discharge from hospital. °• Pain not relieved by pain medication. °• Fever of 100.4° F or greater. °• Unusual vaginal discharge or odor. ° ° Call for an appointment:  ° ° °Patient’s signature: ______________________ ° °Nurse’s signature ________________________ ° °Support person's signature_______________________ ° ° °DISCHARGE INSTRUCTIONS: D&C / D&E °The following instructions have been prepared to help you care for yourself upon your  return home. °  °Personal hygiene: °• Use sanitary pads for vaginal drainage, not tampons. °• Shower the day after your procedure. °• NO tub baths, pools or Jacuzzis for 2-3 weeks. °• Wipe front to back after using the bathroom. ° °Activity and limitations: °• Do NOT drive or operate any equipment for 24 hours. The effects of anesthesia are still present and drowsiness may result. °• Do NOT rest in bed all day. °• Walking is encouraged. °• Walk up and down stairs slowly. °• You may resume your normal activity in one to two days or as indicated by your physician. ° °Sexual activity: NO intercourse for at least 2 weeks after the procedure, or as indicated by your physician. ° °Diet: Eat a light meal as desired this evening. You may resume your usual diet tomorrow. ° °Return to work: You may resume your work activities in one to two days or as indicated by your doctor. ° °What to expect after your surgery: Expect to have vaginal bleeding/discharge for 2-3 days and spotting for up to 10 days. It is not unusual to have soreness for up to 1-2 weeks. You may have a slight burning sensation when you urinate for the first day. Mild cramps may continue for a couple of days. You may have a regular period in 2-6 weeks. ° °Call your doctor for any of the following: °• Excessive vaginal bleeding, saturating and changing one pad every hour. °• Inability to urinate 6 hours after discharge from hospital. °• Pain not relieved by   pain medication. °• Fever of 100.4° F or greater. °• Unusual vaginal discharge or odor. ° ° Call for an appointment:  ° ° °Patient’s signature: ______________________ ° °Nurse’s signature ________________________ ° °Support person's signature_______________________ ° ° ° °

## 2017-10-31 NOTE — Interval H&P Note (Signed)
History and Physical Interval Note:  10/31/2017 10:04 AM  [name]  has presented today for surgery, with the diagnosis of AUB, endometrial polyp  The various methods of treatment have been discussed with the patient and family. After consideration of risks, benefits and other options for treatment, the patient has consented to  Procedure(s): [place] (N/A) as a surgical intervention .  The patient's history has been reviewed, patient examined, no change in status, stable for surgery.  I have reviewed the patient's chart and labs.  Questions were answered to the patient's satisfaction.     Salvadore Dom

## 2017-11-01 ENCOUNTER — Encounter (HOSPITAL_COMMUNITY): Payer: Self-pay | Admitting: Obstetrics and Gynecology

## 2017-11-03 ENCOUNTER — Telehealth: Payer: Self-pay | Admitting: *Deleted

## 2017-11-03 NOTE — Telephone Encounter (Signed)
Reviewed results with patient, see result note dated 11/03/17. Patient states her menses started night before surgery, 9/5. States she was not expecting to have bleeding after. Changing large pad 3-4x/day, "not as heavy as previous menses". Denies pain, N/V, fever/chills, SOB, weakness, fatigue, lightheadedness.   Advised patient can have bleeding after surgery, instructed to continue to monitor, call with heavy bleeding, changing saturated pad q1-2 hrs or any new symptoms. Advised patient Dr. Talbert Nan will review, I will return call if any additional recommendations.    Routing to provider for final review. Patient is agreeable to disposition. Will close encounter.

## 2017-11-03 NOTE — Telephone Encounter (Addendum)
Notes recorded by Burnice Logan, RN on 11/03/2017 at 2:43 PM EDT Spoke with patient, advised of results and recommendations as seen below per Dr. Talbert Nan. See telephone encounter dated 11/03/17 to review additional concerns with provider. ------  Notes recorded by Salvadore Dom, MD on 11/03/2017 at 1:13 PM EDT Please inform the patient that her pathology is all benign (polyps and endometrium). We will discuss a plan at her post op visit.

## 2017-11-04 ENCOUNTER — Ambulatory Visit (INDEPENDENT_AMBULATORY_CARE_PROVIDER_SITE_OTHER): Payer: Medicare Other | Admitting: Pulmonary Disease

## 2017-11-04 ENCOUNTER — Other Ambulatory Visit: Payer: Self-pay | Admitting: Cardiology

## 2017-11-04 ENCOUNTER — Encounter: Payer: Self-pay | Admitting: Pulmonary Disease

## 2017-11-04 VITALS — BP 136/74 | HR 76 | Ht 63.0 in | Wt 337.0 lb

## 2017-11-04 DIAGNOSIS — R0602 Shortness of breath: Secondary | ICD-10-CM | POA: Diagnosis not present

## 2017-11-04 DIAGNOSIS — Z23 Encounter for immunization: Secondary | ICD-10-CM | POA: Diagnosis not present

## 2017-11-04 NOTE — Telephone Encounter (Signed)
Spoke with patient and she stated spoke with [facility] and the pharmacist told her her Entresto medication was covered.  While patient had me on the phone she wanted to know if we had received a prior auth for her Coreg. I told her I have not received anything, but I would speak with Nya and get back with her. She requested brand name medically necessary. She voiced understanding and requested a call back once the Prior Auth had been completed.

## 2017-11-04 NOTE — Telephone Encounter (Signed)
Spoke with patient and informed her that [name] recommended she contact her insurance to price the [employer]. Patient stated she didn't know she was suppose to check but she is busy and she will call the office back later to discuss this matter.

## 2017-11-04 NOTE — Progress Notes (Signed)
[name]    [id number]    [birth year]  Primary Care Physician:Wallace, Danae Chen, DO  Referring Physician: Briscoe Deutscher, [address] [place], [place] [address]  Chief complaint: Follow-up for dyspnea  HPI: 52 year old with history of nonischemic cardiomyopathy, cath with clean coronaries, EF 25-30%, mild pulmonary hypertension, OSA, BMI 57 Referred here for evaluation of dyspnea.  States that she has a feeling of fullness, firmness in the abdomen associated with dyspnea, inability to take a deep breath.  There are no abnormalities on CT chest abdomen pelvis.  She has been worked up by GI recently and had EGD, colonoscopy which are normal as per the patient.  I do not have these records to review.   She also follows with Dr. Percival Spanish for management of chronic systolic heart failure.  Has history of OSA but is not compliant with CPAP. She is working with cardiology to get her CPAP restarted Also has issues with obesity and gained 30 pounds over the past year.  She is working on starting an exercise program to get her weight down.  Noted to have stress and anxiety, personal stressors at home.  Recently she spend some time in [place] as her mother was ill.  Her mother passed away in 07/22/17  Pets: 2 cats, no birds, farm animals, dogs Occupation: Used to work in a Theatre manager, nursing home, as a Surveyor, minerals.  Disabled since 2005 Exposures: No known exposures, no mold, hot tub, Jacuzzi Smoking history: 5-pack-year smoker.  Quit in 2016 Travel history: Used to live in [place].  No significant recent travel. Relevant family history: No significant family history.   Interim history: Follows up with cardiology.  Started on spironolactone and Entresto with improvement in symptoms She had a CPST which showed submaximal effort, limitation due to body habitus  Overall she feels improved with her breathing.  She does not need to use her rescue inhaler.  Outpatient Encounter  Medications as of 11/04/2017  Medication Sig  . amLODipine (NORVASC) 10 MG tablet TAKE 1 TABLET BY MOUTH EVERY DAY  . busPIRone (BUSPAR) 10 MG tablet Take 1 tablet (10 mg total) by mouth 2 (two) times daily.  . carvedilol (COREG) 25 MG tablet Take 1 tablet (25 mg total) by mouth 2 (two) times daily with a meal. (Patient taking differently: Take 37.5 mg by mouth 2 (two) times daily with a meal. )  . clonazePAM (KLONOPIN) 1 MG tablet Take .5 mg qd prn anxiety  . clotrimazole-betamethasone (LOTRISONE) cream Apply 1 application topically 2 (two) times daily.  [name] COREG 12.5 MG tablet Take 1 tablet (12.5 mg total) by mouth 2 (two) times daily with a meal. (Patient taking differently: Take 37.5 mg by mouth 2 (two) times daily with a meal. )  . dicyclomine (BENTYL) 20 MG tablet Take 1 tablet (20 mg total) by mouth every 6 (six) hours.  . furosemide (LASIX) 40 MG tablet Take 1 tablet (40 mg total) by mouth 2 (two) times daily. Take as directed  . ibuprofen (ADVIL,MOTRIN) 800 MG tablet TAKE 1 TABLET (800 MG TOTAL) BY MOUTH EVERY 8 (EIGHT) HOURS AS NEEDED FOR HEADACHE OR MILD PAIN.  [name] lamoTRIgine (LAMICTAL) 200 MG tablet Take 1 tablet (200 mg total) by mouth daily.  . potassium chloride SA (KLOR-CON M20) 20 MEQ tablet Take 1 tablet (20 mEq total) by mouth daily.  . Psyllium (NAT-RUL PSYLLIUM SEED HUSKS) 500 MG CAPS 1-2 po qid as needed  .  sacubitril-valsartan (ENTRESTO) 97-103 MG Take 1 tablet by mouth 2 (two) times daily.  . sertraline (ZOLOFT) 100 MG tablet Take two tablets PO QDAY  . simvastatin (ZOCOR) 20 MG tablet TAKE 1 TABLET BY MOUTH EVERY DAY  . spironolactone (ALDACTONE) 25 MG tablet Take 0.5 tablets (12.5 mg total) by mouth daily.   No facility-administered encounter medications on file as of 11/04/2017.     Allergies as of 11/04/2017 - Review Complete 11/04/2017  Allergen Reaction Noted  . Fluoxetine Other (See Comments) 11/27/2013  . Cymbalta [duloxetine hcl] Other (See Comments) 05/05/2015    . Penicillins      Past Medical History:  Diagnosis Date  . ADD (attention deficit disorder)   . Anxiety   . Arthritis    both knees right worse than left  . Cardiomyopathy, peripartum, postpartum   . Carpal tunnel syndrome of right wrist   . CHF (congestive heart failure) ([place])   . Chronic systolic CHF (congestive heart failure), NYHA class 3 ([place]) 12/06/2013  . Depression   . Dyspnea   . Essential hypertension, benign   . Gallstones   . Migraines   . Morbid obesity ([place])   . OSA (obstructive sleep apnea)   . Plantar fasciitis   . Pre-diabetes   . Prediabetes   . Spinal stenosis   . Spinal stenosis    back pain    Past Surgical History:  Procedure Laterality Date  . CHOLECYSTECTOMY    . DILATATION & CURETTAGE/HYSTEROSCOPY WITH MYOSURE N/A 10/31/2017   Procedure: DILATATION & CURETTAGE/HYSTEROSCOPY WITH MYOSURE;  Surgeon: Salvadore Dom, MD;  Location: [facility];  Service: Gynecology;  Laterality: N/A;  . RIGHT/LEFT HEART CATH AND CORONARY ANGIOGRAPHY N/A 04/11/2016   Procedure: Right/Left Heart Cath and Coronary Angiography;  Surgeon: Burnell Blanks, MD;  Location: [facility];  Service: Cardiovascular;  Laterality: N/A;  . sonogram for blood clots     no blockages    Family History  Problem Relation Age of Onset  . Depression Mother   . Anxiety disorder Mother   . Diabetes Mother   . Hypertension Mother   . ADD / ADHD Father   . Diabetes Father   . Hypertension Father   . Hypertension Other   . Diabetes Other   . Colitis Other   . Alcohol abuse Other     Social History   Socioeconomic History  . Marital status: Married    Spouse name: Not on file  . Number of children: Not on file  . Years of education: Not on file  . Highest education level: Not on file  Occupational History  . Not on file  Social Needs  . Financial resource strain: Not on file  . Food insecurity:    Worry: Not on file    Inability: Not on file  . Transportation  needs:    Medical: Not on file    Non-medical: Not on file  Tobacco Use  . Smoking status: Former Smoker    Packs/day: 0.50    Types: Cigarettes    Last attempt to quit: 02/25/1997    Years since quitting: 20.7  . Smokeless tobacco: Never Used  . Tobacco comment: Married, lives with spouse (when he is not traveling) and son  Substance and Sexual Activity  . Alcohol use: No  . Drug use: No  . Sexual activity: Yes    Partners: Male    Birth control/protection: None  Lifestyle  . Physical activity:    Days  per week: Not on file    Minutes per session: Not on file  . Stress: Not on file  Relationships  . Social connections:    Talks on phone: Not on file    Gets together: Not on file    Attends religious service: Not on file    Active member of club or organization: Not on file    Attends meetings of clubs or organizations: Not on file    Relationship status: Not on file  . Intimate partner violence:    Fear of current or ex partner: Not on file    Emotionally abused: Not on file    Physically abused: Not on file    Forced sexual activity: Not on file  Other Topics Concern  . Not on file  Social History Narrative  . Not on file    Review of systems: Review of Systems  Constitutional: Negative for fever and chills.  HENT: Negative.   Eyes: Negative for blurred vision.  Respiratory: as per HPI  Cardiovascular: Negative for chest pain and palpitations.  Gastrointestinal: Negative for vomiting, diarrhea, blood per rectum. Genitourinary: Negative for dysuria, urgency, frequency and hematuria.  Musculoskeletal: Negative for myalgias, back pain and joint pain.  Skin: Negative for itching and rash.  Neurological: Negative for dizziness, tremors, focal weakness, seizures and loss of consciousness.  Endo/Heme/Allergies: Negative for environmental allergies.  Psychiatric/Behavioral: Negative for depression, suicidal ideas and hallucinations.  All other systems reviewed and are  negative.  Physical Exam: Blood pressure 136/74, pulse 76, height 5\' 3"  (1.6 m), weight (!) 337 lb (152.9 kg), SpO2 95 %. Gen:      No acute distress HEENT:  EOMI, sclera anicteric Neck:     No masses; no thyromegaly Lungs:    Clear to auscultation bilaterally; normal respiratory effort CV:         Regular rate and rhythm; no murmurs Abd:      + bowel sounds; soft, non-tender; no palpable masses, no distension Ext:    Trace edema; adequate peripheral perfusion Skin:      Warm and dry; no rash Neuro: alert and oriented x 3 Psych: normal mood and affect  Data Reviewed: CT chest abdomen pelvis 07/28/2017- normal lungs with no interstitial opacity.  No acute abnormality in the chest or abdomen pelvis.  I have reviewed the images personally.  Echocardiogram 08/22/2017-  Left ventricle: The cavity size was moderately dilated. Wall   thickness was increased in a pattern of mild LVH. Systolic   function was moderately reduced. The estimated ejection fraction   was in the range of 35% to 40%. Diffuse hypokinesis. Features are   consistent with a pseudonormal left ventricular filling pattern,   with concomitant abnormal relaxation and increased filling   pressure (grade 2 diastolic dysfunction).  FENO 09/02/2017-14  PFTs 07/09/2016 FVC 2.46 [71%], FEV1 2.06 (1 5%], F/F 84, TLC 76%, DLCO 72%,  DLCO/VA 99% No obstruction, minimal restriction, diffusion defect.  CPST 09/25/17 The interpretation of this test is limited due to submaximal effort during the exercise. Based on available data, exercise testing with gas exchange demonstrates normal functional capacity when compared to matched sedentary norms. However, patient desaturated with exertion to a low of 88% at peak exercise, which is likely a primary factor of her symptoms. Patient is also morbidly obese and is severely limited due to her body habitus and related restrictive lung physiology. Please correlate clinically.   Assessment:  Follow up  for PFTs reviewed with no significant obstruction.  There is mild restriction and diffusion impairment that corrects for alveolar volume.  These abnormalities are due to her body habitus given the reduction in RV.  There is no evidence of interstitial lung disease or lung abnormalities on recent CT of the chest  Suspect that her dyspnea is secondary to a combination of deconditioning, body habitus, cardiomyopathy CPST reviewed with normal functional capacity, submaximal effort and limitation due to body habitus related restrictive lung physiology. I do not see any intrinsic lung abnormality to explain her dyspnea.  Advised continued follow-up with cardiology, weight loss with diet and exercise Follow-up as needed.  Cardiomyopathy Follow-up with cardiology.  OSA She follows up with heart failure clinic for management of CPAP.  Plan/Recommendations: - Flu vaccine - Encouraged weight loss and exercise.  Marshell Garfinkel MD [facility] 11/04/2017, 10:12 AM  CC: Briscoe Deutscher, DO

## 2017-11-04 NOTE — Patient Instructions (Signed)
I am glad that your breathing is improving Continue to follow-up with your heart doctor, work on diet, weight loss and exercise You can follow-up with [place] as needed We will give a flu shot today.

## 2017-11-05 ENCOUNTER — Other Ambulatory Visit: Payer: Self-pay

## 2017-11-07 ENCOUNTER — Encounter: Payer: Self-pay | Admitting: Family Medicine

## 2017-11-07 ENCOUNTER — Ambulatory Visit (INDEPENDENT_AMBULATORY_CARE_PROVIDER_SITE_OTHER): Payer: Medicare Other | Admitting: Family Medicine

## 2017-11-07 VITALS — BP 134/80 | HR 82 | Temp 98.2°F | Ht 63.0 in | Wt 333.2 lb

## 2017-11-07 DIAGNOSIS — R0602 Shortness of breath: Secondary | ICD-10-CM | POA: Diagnosis not present

## 2017-11-07 DIAGNOSIS — M545 Low back pain: Secondary | ICD-10-CM

## 2017-11-07 DIAGNOSIS — Z23 Encounter for immunization: Secondary | ICD-10-CM | POA: Diagnosis not present

## 2017-11-07 DIAGNOSIS — Z77128 Contact with and (suspected) exposure to other hazards in the physical environment: Secondary | ICD-10-CM

## 2017-11-07 DIAGNOSIS — E1165 Type 2 diabetes mellitus with hyperglycemia: Secondary | ICD-10-CM | POA: Diagnosis not present

## 2017-11-07 DIAGNOSIS — G4733 Obstructive sleep apnea (adult) (pediatric): Secondary | ICD-10-CM

## 2017-11-07 DIAGNOSIS — G8929 Other chronic pain: Secondary | ICD-10-CM | POA: Diagnosis not present

## 2017-11-07 MED ORDER — METFORMIN HCL ER 750 MG PO TB24: ORAL_TABLET | ORAL | 1 refills | Status: DC

## 2017-11-07 MED ORDER — IBUPROFEN 800 MG PO TABS: 800.0000 mg | ORAL_TABLET | Freq: Three times a day (TID) | ORAL | 1 refills | Status: DC | PRN

## 2017-11-07 NOTE — Progress Notes (Signed)
[name] is a 52 y.o. female is here for follow up.  History of Present Illness:   [name], CMA acting as scribe for Dr. Briscoe Deutscher.   HPI: Patient in office for follow up. She has been seen by cardiology and pulmonology SOB improved. She had procedure with Dr. Talbert Nan on 10/31/17. She has had improvement but hs is still having little spotting. She has spoke to office and given instructions from them.   She was seen by cardiology and labs were drawn. She received a call that her blood sugar was a little elevated and should follow up with our office for that. She will start [facility] on 10/1917 with [facility].   Patient is not using C Pap due to issue with not being able to get supplies. She has been in contact with company and she states that they are not able to get part that she needs. She states that she is having a hard time with the prices even though her insurance pays for it. She is still concerned that they are overcharging. She states that she is using her old system now.   She has had increased stress due to home renovations and getting her rental company to fix things that are wrong with her home. Her husband is now living back in [place] and she feels that they are getting adjusted after living apart for so long.   Back pain: She is having trouble walking long distances right now due to back pain. She is planing on making an appointment with the [facility] where she has been seen in the past. She is taking over the counter medications with little improvement. She is looking forward to starting exercising soon and hopes it will give her some improvement on so many things.   Labs: Last labs done with Dr. Talbert Nan on 11/08/17 with and A1C of 6.5. She was informed of all medication options for diabetes control. After reviewing all options she has agreed to start metformin 750xr 1 at bedtime for one month then two at bed time if tolerated. She is also going to  work on changing her diet as well.   There are no preventive care reminders to display for this patient. Depression screen [facility] 2/9 11/07/2017 10/18/2016 06/26/2016  Decreased Interest 1 2 2   Down, Depressed, Hopeless 1 3 2   PHQ - 2 Score 2 5 4   Altered sleeping 0 0 2  Tired, decreased energy 3 0 3  Change in appetite 2 1 3   Feeling bad or failure about yourself  1 1 3   Trouble concentrating 3 0 3  Moving slowly or fidgety/restless 0 0 3  Suicidal thoughts 0 1 0  PHQ-9 Score 11 8 21   Difficult doing work/chores Somewhat difficult Not difficult at all Extremely dIfficult  Some recent data might be hidden   PMHx, SurgHx, SocialHx, FamHx, Medications, and Allergies were reviewed in the Visit Navigator and updated as appropriate.   Patient Active Problem List   Diagnosis Date Noted  . Prediabetes   . Acute on chronic systolic CHF (congestive heart failure) ([place]) 10/19/2016  . Hyperglycemia 10/19/2016  . SOB (shortness of breath) 06/22/2016  . Smoker 06/22/2016  . Normal coronary arteries 04/17/2016  . Obstructive sleep apnea syndrome 10/28/2014  . Major depressive disorder 10/28/2014  . Chronic systolic CHF (congestive heart failure), NYHA class 3 ([place]) 12/06/2013  . Dyslipidemia 04/04/2011  . Hypertension 06/03/2008  . Morbid obesity ([place]) 07/12/2006  .  NICM (nonischemic cardiomyopathy) ([place]) 07/12/2006  . Attention deficit disorder 07/12/2006   Social History   Tobacco Use  . Smoking status: Former Smoker    Packs/day: 0.50    Types: Cigarettes    Last attempt to quit: 02/25/1997    Years since quitting: 20.7  . Smokeless tobacco: Never Used  . Tobacco comment: Married, lives with spouse (when he is not traveling) and son  Substance Use Topics  . Alcohol use: No  . Drug use: No   Current Medications and Allergies:   .  amLODipine (NORVASC) 10 MG tablet, TAKE 1 TABLET BY MOUTH EVERY DAY, Disp: 90 tablet, Rfl: 1 .  busPIRone (BUSPAR) 10 MG tablet, Take 1 tablet (10 mg  total) by mouth 2 (two) times daily., Disp: 60 tablet, Rfl: 1 .  carvedilol (COREG) 12.5 MG tablet, Please specify directions, refills and quantity, Disp: 1 tablet, Rfl: 0 .  carvedilol (COREG) 25 MG tablet, Take 1 tablet (25 mg total) by mouth 2 (two) times daily with a meal. (Patient taking differently: Take 37.5 mg by mouth 2 (two) times daily with a meal. ), Disp: 180 tablet, Rfl: 3 .  clonazePAM (KLONOPIN) 1 MG tablet, Take .5 mg qd prn anxiety, Disp: 21 tablet, Rfl: 1 .  clotrimazole-betamethasone (LOTRISONE) cream, Apply 1 application topically 2 (two) times daily., Disp: 45 g, Rfl: 2 .  dicyclomine (BENTYL) 20 MG tablet, Take 1 tablet (20 mg total) by mouth every 6 (six) hours., Disp: 30 tablet, Rfl: 0 .  furosemide (LASIX) 40 MG tablet, Take 1 tablet (40 mg total) by mouth 2 (two) times daily. Take as directed, Disp: 30 tablet, Rfl: 0 .  ibuprofen (ADVIL,MOTRIN) 800 MG tablet, TAKE 1 TABLET (800 MG TOTAL) BY MOUTH EVERY 8 (EIGHT) HOURS AS NEEDED FOR HEADACHE OR MILD PAIN., Disp: 14 tablet, Rfl: 0 .  lamoTRIgine (LAMICTAL) 200 MG tablet, Take 1 tablet (200 mg total) by mouth daily., Disp: 30 tablet, Rfl: 1 .  potassium chloride SA (KLOR-CON M20) 20 MEQ tablet, Take 1 tablet (20 mEq total) by mouth daily., Disp: 180 tablet, Rfl: 3 .  Psyllium (NAT-RUL PSYLLIUM SEED HUSKS) 500 MG CAPS, 1-2 po qid as needed, Disp: 240 each, Rfl: 2 .  sacubitril-valsartan (ENTRESTO) 97-103 MG, Take 1 tablet by mouth 2 (two) times daily., Disp: 180 tablet, Rfl: 1 .  sertraline (ZOLOFT) 100 MG tablet, Take two tablets PO QDAY, Disp: 60 tablet, Rfl: 1 .  simvastatin (ZOCOR) 20 MG tablet, TAKE 1 TABLET BY MOUTH EVERY DAY, Disp: 90 tablet, Rfl: 2 .  spironolactone (ALDACTONE) 25 MG tablet, Take 0.5 tablets (12.5 mg total) by mouth daily., Disp: 45 tablet, Rfl: 3   Allergies  Allergen Reactions  . Fluoxetine Other (See Comments)    More depressed  . Cymbalta [Duloxetine Hcl] Other (See Comments)    depressed  .  Penicillins     REACTION: rash Has patient had a PCN reaction causing immediate rash, facial/tongue/throat swelling, SOB or lightheadedness with hypotension: YES Has patient had a PCN reaction causing severe rash involving mucus membranes or skin necrosis: NO Has patient had a PCN reaction that required hospitalization: YES Has patient had a PCN reaction occurring within the last 10 years: NO If all of the above answers are "NO", then may proceed with Cephalosporin use.    Review of Systems   Pertinent items are noted in the HPI. Otherwise, ROS is negative.  Vitals:   Vitals:   11/07/17 0951  BP: 134/80  Pulse:  82  Temp: 98.2 F (36.8 C)  TempSrc: Oral  SpO2: 93%  Weight: (!) 333 lb 3.2 oz (151.1 kg)  Height: 5\' 3"  (1.6 m)     Body mass index is 59.02 kg/m.  Physical Exam:   Physical Exam  Constitutional: She appears well-nourished.  HENT:  Head: Normocephalic and atraumatic.  Eyes: Pupils are equal, round, and reactive to light. EOM are normal.  Neck: Normal range of motion. Neck supple.  Cardiovascular: Regular rhythm and intact distal pulses. Tachycardia present.  Murmur heard.  Systolic murmur is present with a grade of 3/6. Pulmonary/Chest: Effort normal.  Abdominal: Soft.  Skin: Skin is warm. Capillary refill takes less than 2 seconds.  Psychiatric: She has a normal mood and affect. Her behavior is normal. Her speech is tangential.  Nursing note and vitals reviewed.   Results for orders placed or performed during the hospital encounter of 10/31/17  Hemoglobin A1c  Result Value Ref Range   Hgb A1c MFr Bld 6.5 (H) 4.8 - 5.6 %   Mean Plasma Glucose 139.85 mg/dL  Pregnancy, urine  Result Value Ref Range   Preg Test, Ur NEGATIVE NEGATIVE  Glucose, capillary  Result Value Ref Range   Glucose-Capillary 122 (H) 70 - 99 mg/dL   Comment 1 Document in Chart    Comment 2 Call MD NNP PA CNM     Assessment and Plan:   Sharita was seen today for  follow-up.  Diagnoses and all orders for this visit:  Type 2 diabetes mellitus with hyperglycemia, without long-term current use of insulin ([place]) Comments: NEW. Discussed diagnosis and interventions. Offered education and exercise classes. She will undergoing CP Rehab. Orders: -     metFORMIN (GLUCOPHAGE XR) 750 MG 24 hr tablet; Two tablets at bedtime.  Need for Tdap vaccination -     Tdap vaccine greater than or equal to 7yo IM  Chronic midline low back pain, with sciatica presence unspecified Comments: [facility]  Orders: -     ibuprofen (ADVIL,MOTRIN) 800 MG tablet; Take 1 tablet (800 mg total) by mouth every 8 (eight) hours as needed for headache or mild pain. -     Ambulatory referral to Spine Surgery  Obstructive sleep apnea syndrome Comments: Not compliant due to cost per patient. Discussed the importance of using cpap.   SOB (shortness of breath) Comments: Improved since addition of Entresto.  Contact with and (suspected) exposure to other hazards in the physical environment Comments: Ceiling in sunroom falling. Not fixed in > 1 year.   . Reviewed expectations re: course of current medical issues. . Discussed self-management of symptoms. . Outlined signs and symptoms indicating need for more acute intervention. . Patient verbalized understanding and all questions were answered. [name] Health Maintenance issues including appropriate healthy diet, exercise, and smoking avoidance were discussed with patient. . See orders for this visit as documented in the electronic medical record. . Patient received an After Visit Summary.  CMA served as Education administrator during this visit. History, Physical, and Plan performed by medical provider. The above documentation has been reviewed and is accurate and complete. Briscoe Deutscher, D.O.  Briscoe Deutscher, DO [place], [facility] 11/16/2017

## 2017-11-07 NOTE — Patient Instructions (Addendum)
You are down 10 lbs from 10/22/17 WAY TO GO !!!  Start the Metformin 750mg  XR 1 tablet at night for 1 month then if you do ok with that increase to two at bed time.

## 2017-11-09 ENCOUNTER — Other Ambulatory Visit: Payer: Self-pay | Admitting: Family Medicine

## 2017-11-10 ENCOUNTER — Telehealth: Payer: Self-pay | Admitting: Cardiology

## 2017-11-10 MED ORDER — CARVEDILOL 12.5 MG PO TABS: 12.5000 mg | ORAL_TABLET | Freq: Two times a day (BID) | ORAL | 1 refills | Status: DC

## 2017-11-10 MED ORDER — CARVEDILOL 25 MG PO TABS: 25.0000 mg | ORAL_TABLET | Freq: Two times a day (BID) | ORAL | 1 refills | Status: DC

## 2017-11-10 NOTE — Telephone Encounter (Signed)
Spoke with patient and she stated that she does not need this prescription. She only takes it PRN when her hands go numb. She still has plenty.

## 2017-11-10 NOTE — Telephone Encounter (Signed)
New Message    *STAT* If patient is at the pharmacy, call can be transferred to refill team.   1. Which medications need to be refilled? (please list name of each medication and dose if known) carvedilol (COREG) 25 MG tablet   2. Which pharmacy/location (including street and city if local pharmacy) is medication to be sent to? [id number] - [place], [place] - [address]. Do they need a 30 day or 90 day supply? 90 day

## 2017-11-10 NOTE — Telephone Encounter (Signed)
Pharmacy called about Carvedilol pharmacist states that pt was there over the  Weekend and was trying to refill medication. Ok to fill brand-only per last notes/medication list.   Lm2cb-need to know what type of reaction that she has to generic medication because pharmacist states that she will need a PA for Carvedilol.

## 2017-11-10 NOTE — Telephone Encounter (Signed)
Please advise. This looks like it was discontinued by another provider.

## 2017-11-10 NOTE — Telephone Encounter (Signed)
Clarify with patient or pharmacy.

## 2017-11-11 NOTE — Telephone Encounter (Signed)
lm2cb  

## 2017-11-11 NOTE — Telephone Encounter (Signed)
lm2cb-unable to do PA without reaction from generic

## 2017-11-11 NOTE — Telephone Encounter (Signed)
Pt called back she states that her reaction with taking the generic carvedilol is acid reflux, inability to swallow with the reflux and nausea. Please start prior auth ASAP as she will be out of medication soon and call back pt with status of PA and keep her apprised of the status. She is very upset, she states that she has been doing this for 2 weeks with no success

## 2017-11-13 ENCOUNTER — Ambulatory Visit: Payer: Medicare Other | Admitting: Obstetrics and Gynecology

## 2017-11-13 DIAGNOSIS — I5022 Chronic systolic (congestive) heart failure: Secondary | ICD-10-CM | POA: Diagnosis not present

## 2017-11-13 NOTE — Telephone Encounter (Addendum)
Prior authorization is complete and has been approved by insurance. [facility]oke with [facility] to make sure they received the same notification and Pharmacist stated the medication went through. Left message on patients voicemail informing her that her medication has been approved; advised to contact office with any questions or concerns.

## 2017-11-13 NOTE — Progress Notes (Deleted)
GYNECOLOGY  VISIT   HPI: 52 y.o.   Married White or Caucasian Not Hispanic or Latino  female   [phone] with No LMP recorded. (Menstrual status: Irregular Periods).   here for   2 week post op visit   GYNECOLOGIC HISTORY: No LMP recorded. (Menstrual status: Irregular Periods). Contraception:*** Menopausal hormone therapy: ***        OB History    Gravida  2   Para  1   Term  1   Preterm      AB  1   Living  1     SAB      TAB  1   Ectopic      Multiple      Live Births                 Patient Active Problem List   Diagnosis Date Noted  . Prediabetes   . Acute on chronic systolic CHF (congestive heart failure) ([place]) 10/19/2016  . Hyperglycemia 10/19/2016  . SOB (shortness of breath) 06/22/2016  . Smoker 06/22/2016  . Normal coronary arteries 04/17/2016  . Obstructive sleep apnea syndrome 10/28/2014  . Major depressive disorder 10/28/2014  . Chronic systolic CHF (congestive heart failure), NYHA class 3 ([place]) 12/06/2013  . Dyslipidemia 04/04/2011  . Hypertension 06/03/2008  . Morbid obesity ([place]) 07/12/2006  . NICM (nonischemic cardiomyopathy) ([place]) 07/12/2006  . Attention deficit disorder 07/12/2006    Past Medical History:  Diagnosis Date  . ADD (attention deficit disorder)   . Anxiety   . Arthritis    both knees right worse than left  . Cardiomyopathy, peripartum, postpartum   . Carpal tunnel syndrome of right wrist   . CHF (congestive heart failure) ([place])   . Chronic systolic CHF (congestive heart failure), NYHA class 3 ([place]) 12/06/2013  . Depression   . Dyspnea   . Essential hypertension, benign   . Gallstones   . Migraines   . Morbid obesity ([place])   . OSA (obstructive sleep apnea)   . Plantar fasciitis   . Pre-diabetes   . Prediabetes   . Spinal stenosis   . Spinal stenosis    back pain    Past Surgical History:  Procedure Laterality Date  . CHOLECYSTECTOMY    . DILATATION & CURETTAGE/HYSTEROSCOPY WITH MYOSURE N/A 10/31/2017   Procedure: DILATATION & CURETTAGE/HYSTEROSCOPY WITH MYOSURE;  Surgeon: Salvadore Dom, MD;  Location: [facility];  Service: Gynecology;  Laterality: N/A;  . RIGHT/LEFT HEART CATH AND CORONARY ANGIOGRAPHY N/A 04/11/2016   Procedure: Right/Left Heart Cath and Coronary Angiography;  Surgeon: Burnell Blanks, MD;  Location: [facility];  Service: Cardiovascular;  Laterality: N/A;  . sonogram for blood clots     no blockages    Current Outpatient Medications  Medication Sig Dispense Refill  . amLODipine (NORVASC) 10 MG tablet TAKE 1 TABLET BY MOUTH EVERY DAY 90 tablet 1  . busPIRone (BUSPAR) 10 MG tablet Take 1 tablet (10 mg total) by mouth 2 (two) times daily. 60 tablet 1  . carvedilol (COREG) 12.5 MG tablet Take 1 tablet (12.5 mg total) by mouth 2 (two) times daily with a meal. 180 tablet 1  . carvedilol (COREG) 25 MG tablet Take 1 tablet (25 mg total) by mouth 2 (two) times daily with a meal. 180 tablet 1  . clonazePAM (KLONOPIN) 1 MG tablet Take .5 mg qd prn anxiety 21 tablet 1  . clotrimazole-betamethasone (LOTRISONE) cream Apply 1 application topically 2 (two) times daily. [place]  g 2  . dicyclomine (BENTYL) 20 MG tablet Take 1 tablet (20 mg total) by mouth every 6 (six) hours. 30 tablet 0  . furosemide (LASIX) 40 MG tablet Take 1 tablet (40 mg total) by mouth 2 (two) times daily. Take as directed 30 tablet 0  . ibuprofen (ADVIL,MOTRIN) 800 MG tablet Take 1 tablet (800 mg total) by mouth every 8 (eight) hours as needed for headache or mild pain. 90 tablet 1  . lamoTRIgine (LAMICTAL) 200 MG tablet Take 1 tablet (200 mg total) by mouth daily. 30 tablet 1  . metFORMIN (GLUCOPHAGE XR) 750 MG 24 hr tablet Two tablets at bedtime. 120 tablet 1  . potassium chloride SA (KLOR-CON M20) 20 MEQ tablet Take 1 tablet (20 mEq total) by mouth daily. 180 tablet 3  . Psyllium (NAT-RUL PSYLLIUM SEED HUSKS) 500 MG CAPS 1-2 po qid as needed 240 each 2  . sacubitril-valsartan (ENTRESTO) 97-103 MG Take 1  tablet by mouth 2 (two) times daily. 180 tablet 1  . sertraline (ZOLOFT) 100 MG tablet Take two tablets PO QDAY 60 tablet 1  . simvastatin (ZOCOR) 20 MG tablet TAKE 1 TABLET BY MOUTH EVERY DAY 90 tablet 2  . spironolactone (ALDACTONE) 25 MG tablet Take 0.5 tablets (12.5 mg total) by mouth daily. 45 tablet 3   No current facility-administered medications for this visit.      ALLERGIES: Fluoxetine; Cymbalta [duloxetine hcl]; and Penicillins  Family History  Problem Relation Age of Onset  . Depression Mother   . Anxiety disorder Mother   . Diabetes Mother   . Hypertension Mother   . ADD / ADHD Father   . Diabetes Father   . Hypertension Father   . Hypertension Other   . Diabetes Other   . Colitis Other   . Alcohol abuse Other     Social History   Socioeconomic History  . Marital status: Married    Spouse name: Not on file  . Number of children: Not on file  . Years of education: Not on file  . Highest education level: Not on file  Occupational History  . Not on file  Social Needs  . Financial resource strain: Not on file  . Food insecurity:    Worry: Not on file    Inability: Not on file  . Transportation needs:    Medical: Not on file    Non-medical: Not on file  Tobacco Use  . Smoking status: Former Smoker    Packs/day: 0.50    Types: Cigarettes    Last attempt to quit: 02/25/1997    Years since quitting: 20.7  . Smokeless tobacco: Never Used  . Tobacco comment: Married, lives with spouse (when he is not traveling) and son  Substance and Sexual Activity  . Alcohol use: No  . Drug use: No  . Sexual activity: Yes    Partners: Male    Birth control/protection: None  Lifestyle  . Physical activity:    Days per week: Not on file    Minutes per session: Not on file  . Stress: Not on file  Relationships  . Social connections:    Talks on phone: Not on file    Gets together: Not on file    Attends religious service: Not on file    Active member of club or  organization: Not on file    Attends meetings of clubs or organizations: Not on file    Relationship status: Not on file  . Intimate partner violence:  Fear of current or ex partner: Not on file    Emotionally abused: Not on file    Physically abused: Not on file    Forced sexual activity: Not on file  Other Topics Concern  . Not on file  Social History Narrative  . Not on file    Review of Systems  Constitutional: Negative.   HENT: Negative.   Eyes: Negative.   Respiratory: Negative.   Cardiovascular: Negative.   Gastrointestinal: Negative.   Endocrine: Negative.   Genitourinary: Negative.   Musculoskeletal: Negative.   Skin: Negative.   Allergic/Immunologic: Negative.   Neurological: Negative.   Hematological: Negative.   Psychiatric/Behavioral: Negative.     PHYSICAL EXAMINATION:    There were no vitals taken for this visit.    General appearance: alert, cooperative and appears stated age Neck: no adenopathy, supple, symmetrical, trachea midline and thyroid {CHL AMB PHY EX THYROID NORM DEFAULT:[phone]::"normal to inspection and palpation"} Breasts: {Exam; breast:13139::"normal appearance, no masses or tenderness"} Abdomen: soft, non-tender; non distended, no masses,  no organomegaly  Pelvic: External genitalia:  no lesions              Urethra:  normal appearing urethra with no masses, tenderness or lesions              Bartholins and Skenes: normal                 Vagina: normal appearing vagina with normal color and discharge, no lesions              Cervix: {CHL AMB PHY EX CERVIX NORM [phone]::"no lesions"}              Bimanual Exam:  Uterus:  {CHL AMB PHY EX UTERUS NORM [phone]::"normal size, contour, position, consistency, mobility, non-tender"}              Adnexa: {CHL AMB PHY EX ADNEXA NO MASS DEFAULT:[phone]::"no mass, fullness, tenderness"}              Rectovaginal: {yes no:[id number]}.  Confirms.              Anus:  normal  sphincter tone, no lesions  Chaperone was present for exam.  ASSESSMENT     PLAN    An After Visit Summary was printed and given to the patient.  *** minutes face to face time of which over 50% was spent in counseling.

## 2017-11-16 ENCOUNTER — Encounter: Payer: Self-pay | Admitting: Family Medicine

## 2017-11-17 ENCOUNTER — Other Ambulatory Visit (HOSPITAL_COMMUNITY): Payer: Self-pay | Admitting: Psychiatry

## 2017-11-17 DIAGNOSIS — I5022 Chronic systolic (congestive) heart failure: Secondary | ICD-10-CM | POA: Diagnosis not present

## 2017-11-17 DIAGNOSIS — F331 Major depressive disorder, recurrent, moderate: Secondary | ICD-10-CM

## 2017-11-18 ENCOUNTER — Encounter (HOSPITAL_COMMUNITY): Payer: Self-pay | Admitting: Psychiatry

## 2017-11-18 ENCOUNTER — Telehealth: Payer: Self-pay

## 2017-11-18 ENCOUNTER — Ambulatory Visit (INDEPENDENT_AMBULATORY_CARE_PROVIDER_SITE_OTHER): Payer: Medicare Other | Admitting: Psychiatry

## 2017-11-18 DIAGNOSIS — F331 Major depressive disorder, recurrent, moderate: Secondary | ICD-10-CM

## 2017-11-18 DIAGNOSIS — Z560 Unemployment, unspecified: Secondary | ICD-10-CM

## 2017-11-18 DIAGNOSIS — Z818 Family history of other mental and behavioral disorders: Secondary | ICD-10-CM | POA: Diagnosis not present

## 2017-11-18 DIAGNOSIS — Z87891 Personal history of nicotine dependence: Secondary | ICD-10-CM

## 2017-11-18 DIAGNOSIS — Z811 Family history of alcohol abuse and dependence: Secondary | ICD-10-CM | POA: Diagnosis not present

## 2017-11-18 MED ORDER — LAMOTRIGINE 200 MG PO TABS: 200.0000 mg | ORAL_TABLET | Freq: Every day | ORAL | 1 refills | Status: DC

## 2017-11-18 MED ORDER — BUSPIRONE HCL 10 MG PO TABS: 10.0000 mg | ORAL_TABLET | Freq: Two times a day (BID) | ORAL | 1 refills | Status: DC

## 2017-11-18 MED ORDER — SERTRALINE HCL 100 MG PO TABS: ORAL_TABLET | ORAL | 1 refills | Status: DC

## 2017-11-18 MED ORDER — CLONAZEPAM 1 MG PO TABS: ORAL_TABLET | ORAL | 1 refills | Status: DC

## 2017-11-18 NOTE — Telephone Encounter (Signed)
Clarify with patient

## 2017-11-18 NOTE — Progress Notes (Signed)
BH MD/PA/NP OP Progress Note  11/18/2017 4:24 PM [name]  MRN:  [id number]  Chief Complaint: Patient returns for medication management appointment HPI: 52 year old married female, currently not employed, lives with husband and 27 year old son. History of major depression. Patient reports some improvement.  She endorses some residual depression and anxiety related to chronic stressors, financial health-related.  Stressors include having only one car for the household and having difficulty with finances.  She does, however, acknowledge feeling "better".  Denies suicidal ideations. Currently she is presenting euthymic with a full range /bright affect.  She has been more proactive regarding her physical health and has started going to cardiac rehab.  She has been more active and has lost 15 pounds over the last several weeks.  She states her exertional dyspnea has decreased partially. At this time does not endorse side effects. No suicidal ideations.  Visit Diagnosis:    ICD-10-CM   1. Moderate episode of recurrent major depressive disorder (HCC) F33.1 lamoTRIgine (LAMICTAL) 200 MG tablet    Past Psychiatric History:   Past Medical History:  Past Medical History:  Diagnosis Date  . ADD (attention deficit disorder)   . Anxiety   . Arthritis    both knees right worse than left  . Cardiomyopathy, peripartum, postpartum   . Carpal tunnel syndrome of right wrist   . CHF (congestive heart failure) ([place])   . Chronic systolic CHF (congestive heart failure), NYHA class 3 ([place]) 12/06/2013  . Depression   . Dyspnea   . Essential hypertension, benign   . Gallstones   . Migraines   . Morbid obesity ([place])   . OSA (obstructive sleep apnea)   . Plantar fasciitis   . Pre-diabetes   . Prediabetes   . Spinal stenosis   . Spinal stenosis    back pain    Past Surgical History:  Procedure Laterality Date  . CHOLECYSTECTOMY    . DILATATION & CURETTAGE/HYSTEROSCOPY WITH MYOSURE N/A 10/31/2017    Procedure: DILATATION & CURETTAGE/HYSTEROSCOPY WITH MYOSURE;  Surgeon: Salvadore Dom, MD;  Location: [facility];  Service: Gynecology;  Laterality: N/A;  . RIGHT/LEFT HEART CATH AND CORONARY ANGIOGRAPHY N/A 04/11/2016   Procedure: Right/Left Heart Cath and Coronary Angiography;  Surgeon: Burnell Blanks, MD;  Location: [facility];  Service: Cardiovascular;  Laterality: N/A;  . sonogram for blood clots     no blockages    Family Psychiatric History:   Family History:  Family History  Problem Relation Age of Onset  . Depression Mother   . Anxiety disorder Mother   . Diabetes Mother   . Hypertension Mother   . ADD / ADHD Father   . Diabetes Father   . Hypertension Father   . Hypertension Other   . Diabetes Other   . Colitis Other   . Alcohol abuse Other     Social History:  Social History   Socioeconomic History  . Marital status: Married    Spouse name: Not on file  . Number of children: Not on file  . Years of education: Not on file  . Highest education level: Not on file  Occupational History  . Not on file  Social Needs  . Financial resource strain: Not on file  . Food insecurity:    Worry: Not on file    Inability: Not on file  . Transportation needs:    Medical: Not on file    Non-medical: Not on file  Tobacco Use  . Smoking status: Former  Smoker    Packs/day: 0.50    Types: Cigarettes    Last attempt to quit: 02/25/1997    Years since quitting: 20.7  . Smokeless tobacco: Never Used  . Tobacco comment: Married, lives with spouse (when he is not traveling) and son  Substance and Sexual Activity  . Alcohol use: No  . Drug use: No  . Sexual activity: Yes    Partners: Male    Birth control/protection: None  Lifestyle  . Physical activity:    Days per week: Not on file    Minutes per session: Not on file  . Stress: Not on file  Relationships  . Social connections:    Talks on phone: Not on file    Gets together: Not on file    Attends  religious service: Not on file    Active member of club or organization: Not on file    Attends meetings of clubs or organizations: Not on file    Relationship status: Not on file  Other Topics Concern  . Not on file  Social History Narrative  . Not on file    Allergies:  Allergies  Allergen Reactions  . Fluoxetine Other (See Comments)    More depressed  . Cymbalta [Duloxetine Hcl] Other (See Comments)    depressed  . Penicillins     REACTION: rash Has patient had a PCN reaction causing immediate rash, facial/tongue/throat swelling, SOB or lightheadedness with hypotension: YES Has patient had a PCN reaction causing severe rash involving mucus membranes or skin necrosis: NO Has patient had a PCN reaction that required hospitalization: YES Has patient had a PCN reaction occurring within the last 10 years: NO If all of the above answers are "NO", then may proceed with Cephalosporin use.     Metabolic Disorder Labs: Lab Results  Component Value Date   HGBA1C 6.5 (H) 10/29/2017   MPG 139.85 10/29/2017   MPG 125.5 10/19/2016   MPG 125.5 10/19/2016   No results found for: PROLACTIN Lab Results  Component Value Date   CHOL 204 (H) 07/18/2016   TRIG 123.0 07/18/2016   HDL 51.10 07/18/2016   CHOLHDL 4 07/18/2016   VLDL 24.6 07/18/2016   LDLCALC 128 (H) 07/18/2016   LDLCALC 78 11/08/2013   Lab Results  Component Value Date   TSH 4.18 07/14/2017   TSH 0.804 10/19/2016    Therapeutic Level Labs: No results found for: LITHIUM No results found for: VALPROATE No components found for:  CBMZ  Current Medications: Current Outpatient Medications  Medication Sig Dispense Refill  . amLODipine (NORVASC) 10 MG tablet TAKE 1 TABLET BY MOUTH EVERY DAY 90 tablet 1  . busPIRone (BUSPAR) 10 MG tablet Take 1 tablet (10 mg total) by mouth 2 (two) times daily. 60 tablet 1  . carvedilol (COREG) 12.5 MG tablet Take 1 tablet (12.5 mg total) by mouth 2 (two) times daily with a meal. 180  tablet 1  . carvedilol (COREG) 25 MG tablet Take 1 tablet (25 mg total) by mouth 2 (two) times daily with a meal. 180 tablet 1  . clonazePAM (KLONOPIN) 1 MG tablet Take .5 mg qd prn anxiety 21 tablet 1  . clotrimazole-betamethasone (LOTRISONE) cream Apply 1 application topically 2 (two) times daily. 45 g 2  . dicyclomine (BENTYL) 20 MG tablet Take 1 tablet (20 mg total) by mouth every 6 (six) hours. 30 tablet 0  . furosemide (LASIX) 40 MG tablet Take 1 tablet (40 mg total) by mouth 2 (two) times daily.  Take as directed 30 tablet 0  . ibuprofen (ADVIL,MOTRIN) 800 MG tablet Take 1 tablet (800 mg total) by mouth every 8 (eight) hours as needed for headache or mild pain. 90 tablet 1  . lamoTRIgine (LAMICTAL) 200 MG tablet Take 1 tablet (200 mg total) by mouth daily. 30 tablet 1  . metFORMIN (GLUCOPHAGE XR) 750 MG 24 hr tablet Two tablets at bedtime. 120 tablet 1  . potassium chloride SA (KLOR-CON M20) 20 MEQ tablet Take 1 tablet (20 mEq total) by mouth daily. 180 tablet 3  . Psyllium (NAT-RUL PSYLLIUM SEED HUSKS) 500 MG CAPS 1-2 po qid as needed 240 each 2  . sacubitril-valsartan (ENTRESTO) 97-103 MG Take 1 tablet by mouth 2 (two) times daily. 180 tablet 1  . sertraline (ZOLOFT) 100 MG tablet Take two tablets PO QDAY 60 tablet 1  . simvastatin (ZOCOR) 20 MG tablet TAKE 1 TABLET BY MOUTH EVERY DAY 90 tablet 2  . spironolactone (ALDACTONE) 25 MG tablet Take 0.5 tablets (12.5 mg total) by mouth daily. 45 tablet 3   No current facility-administered medications for this visit.      Musculoskeletal: Strength & Muscle Tone: within normal limits Gait & Station: normal Patient leans: N/A  Psychiatric Specialty Exam: ROS no chest pain, no dyspnea at room air, vomiting  There were no vitals taken for this visit.There is no height or weight on file to calculate BMI.  General Appearance: Well Groomed  Eye Contact:  Good  Speech:  Normal Rate  Volume:  Normal  Mood:  Improved mood and currently  presents euthymic  Affect:  Appropriate and Full Range  Thought Process:  Linear and Descriptions of Associations: Intact  Orientation:  Full (Time, Place, and Person)  Thought Content: No hallucinations, no delusions   Suicidal Thoughts:  No-denies suicidal or self-injurious ideations, future oriented, denies homicidal or violent ideations  Homicidal Thoughts:  No  Memory:  Recent and remote grossly intact  Judgement:  Other:  Improving  Insight:  Present  Psychomotor Activity:  Normal  Concentration:  Concentration: Good and Attention Span: Good  Recall:  Good  Fund of Knowledge: Good  Language: Good  Akathisia:  Negative  Handed:  Right  AIMS (if indicated):  AIMS test not done, does not endorse any involuntary movements  Assets:  Desire for Improvement Resilience Social Support  ADL's:  Intact  Cognition: WNL  Sleep:  Good   Screenings: AUDIT     Admission (Discharged) from 12/05/2013 in [facility]  Alcohol Use Disorder Identification Test Final Score (AUDIT)  0    PHQ2-9     Office Visit from 11/07/2017 in [facility] Visit from 10/18/2016 in [facility] from 06/26/2016 in [facility] from 07/19/2015 in [facility] Visit from 07/05/2015 in [facility] at [facility]  PHQ-2 Total Score  2  5  4  2   0  PHQ-9 Total Score  11  8  21  5   -       Assessment and Plan: Patient acknowledges improving and is presenting with a bright/full range of affect, currently euthymic.  She also reports increased physical activity via cardiac rehab participation with some improvement in exertional dyspnea, and an improved sense of well being . Currently tolerating medications well Reviewed BuSpar 10 mg twice daily, Klonopin 0.5 mg daily as needed for anxiety, Lamictal 200 mg  daily  for mood disorder, Zoloft 200 mg daily for anxiety/depression. Side effects reviewed. We will see patient in 6 to 8 weeks, agrees to contact me sooner should to be any worsening or prior.   Jenne Campus, MD 11/18/2017, 4:24 PM

## 2017-11-18 NOTE — Telephone Encounter (Signed)
Fax received from pharmacy requesting refill on gabapentin I do not see on medication list.

## 2017-11-19 DIAGNOSIS — I5022 Chronic systolic (congestive) heart failure: Secondary | ICD-10-CM | POA: Diagnosis not present

## 2017-11-19 NOTE — Telephone Encounter (Signed)
Left message to return call to our office.  

## 2017-11-20 DIAGNOSIS — I5022 Chronic systolic (congestive) heart failure: Secondary | ICD-10-CM | POA: Diagnosis not present

## 2017-11-24 DIAGNOSIS — I5022 Chronic systolic (congestive) heart failure: Secondary | ICD-10-CM | POA: Diagnosis not present

## 2017-11-26 DIAGNOSIS — I5022 Chronic systolic (congestive) heart failure: Secondary | ICD-10-CM | POA: Diagnosis not present

## 2017-11-26 NOTE — Telephone Encounter (Signed)
Called patient l/m to call office  

## 2017-11-27 DIAGNOSIS — I5022 Chronic systolic (congestive) heart failure: Secondary | ICD-10-CM | POA: Diagnosis not present

## 2017-11-28 DIAGNOSIS — M4802 Spinal stenosis, cervical region: Secondary | ICD-10-CM | POA: Diagnosis not present

## 2017-11-28 DIAGNOSIS — M5030 Other cervical disc degeneration, unspecified cervical region: Secondary | ICD-10-CM | POA: Diagnosis not present

## 2017-11-28 DIAGNOSIS — M47812 Spondylosis without myelopathy or radiculopathy, cervical region: Secondary | ICD-10-CM | POA: Diagnosis not present

## 2017-11-28 DIAGNOSIS — M549 Dorsalgia, unspecified: Secondary | ICD-10-CM | POA: Diagnosis not present

## 2017-12-01 DIAGNOSIS — I5022 Chronic systolic (congestive) heart failure: Secondary | ICD-10-CM | POA: Diagnosis not present

## 2017-12-01 NOTE — Progress Notes (Signed)
GYNECOLOGY  VISIT   HPI: 52 y.o.   Married White or Caucasian Not Hispanic or Latino  female   [phone] with Patient's last menstrual period was 10/30/2017 (approximate).   here one month s/p hysteroscopy, polypectomy, D&C for a postoperative check. Pathology returned with secretory endometrium, benign endometrial and endocervical polyps. She spotted for about a week after surgery.  Prior to her surgery she was cycling every 3-4 months x 5-6 days with very heavy flow. Normal CBC, TSH, pap.   GYNECOLOGIC HISTORY: Patient's last menstrual period was 10/30/2017 (approximate). Contraception: None Menopausal hormone therapy: None        OB History    Gravida  2   Para  1   Term  1   Preterm      AB  1   Living  1     SAB      TAB  1   Ectopic      Multiple      Live Births                 Patient Active Problem List   Diagnosis Date Noted  . Type 2 diabetes mellitus with hyperglycemia ([place])   . Acute on chronic systolic CHF (congestive heart failure) ([place]) 10/19/2016  . Hyperglycemia 10/19/2016  . SOB (shortness of breath) 06/22/2016  . Smoker 06/22/2016  . Normal coronary arteries 04/17/2016  . Obstructive sleep apnea syndrome 10/28/2014  . Major depressive disorder 10/28/2014  . Chronic systolic CHF (congestive heart failure), NYHA class 3 ([place]) 12/06/2013  . Hyperlipidemia associated with type 2 diabetes mellitus ([place]) 04/04/2011  . Hypertension associated with diabetes ([place]) 06/03/2008  . Morbid obesity ([place]) 07/12/2006  . NICM (nonischemic cardiomyopathy) ([place]) 07/12/2006  . Attention deficit disorder 07/12/2006    Past Medical History:  Diagnosis Date  . ADD (attention deficit disorder)   . Anxiety   . Arthritis    both knees right worse than left  . Cardiomyopathy, peripartum, postpartum   . Carpal tunnel syndrome of right wrist   . CHF (congestive heart failure) ([place])   . Chronic systolic CHF (congestive heart failure), NYHA class 3 ([place])  12/06/2013  . Depression   . Dyspnea   . Essential hypertension, benign   . Gallstones   . Migraines   . Morbid obesity ([place])   . OSA (obstructive sleep apnea)   . Plantar fasciitis   . Pre-diabetes   . Prediabetes   . Spinal stenosis   . Spinal stenosis    back pain    Past Surgical History:  Procedure Laterality Date  . CHOLECYSTECTOMY    . DILATATION & CURETTAGE/HYSTEROSCOPY WITH MYOSURE N/A 10/31/2017   Procedure: DILATATION & CURETTAGE/HYSTEROSCOPY WITH MYOSURE;  Surgeon: Salvadore Dom, MD;  Location: [facility];  Service: Gynecology;  Laterality: N/A;  . RIGHT/LEFT HEART CATH AND CORONARY ANGIOGRAPHY N/A 04/11/2016   Procedure: Right/Left Heart Cath and Coronary Angiography;  Surgeon: Burnell Blanks, MD;  Location: [facility];  Service: Cardiovascular;  Laterality: N/A;  . sonogram for blood clots     no blockages    Current Outpatient Medications  Medication Sig Dispense Refill  . amLODipine (NORVASC) 10 MG tablet TAKE 1 TABLET BY MOUTH EVERY DAY 90 tablet 1  . busPIRone (BUSPAR) 10 MG tablet Take 1 tablet (10 mg total) by mouth 2 (two) times daily. 60 tablet 1  . carvedilol (COREG) 12.5 MG tablet Take 1 tablet (12.5 mg total) by mouth 2 (two) times daily  with a meal. 180 tablet 1  . carvedilol (COREG) 25 MG tablet Take 1 tablet (25 mg total) by mouth 2 (two) times daily with a meal. 180 tablet 1  . clonazePAM (KLONOPIN) 1 MG tablet Take .5 mg qd prn anxiety 21 tablet 1  . clotrimazole-betamethasone (LOTRISONE) cream Apply 1 application topically 2 (two) times daily. 45 g 2  . dicyclomine (BENTYL) 20 MG tablet Take 1 tablet (20 mg total) by mouth every 6 (six) hours. 30 tablet 0  . furosemide (LASIX) 40 MG tablet Take 1 tablet (40 mg total) by mouth 2 (two) times daily. Take as directed 30 tablet 0  . ibuprofen (ADVIL,MOTRIN) 800 MG tablet Take 1 tablet (800 mg total) by mouth every 8 (eight) hours as needed for headache or mild pain. 90 tablet 1  .  lamoTRIgine (LAMICTAL) 200 MG tablet Take 1 tablet (200 mg total) by mouth daily. 30 tablet 1  . metFORMIN (GLUCOPHAGE XR) 750 MG 24 hr tablet Two tablets at bedtime. 120 tablet 1  . potassium chloride SA (KLOR-CON M20) 20 MEQ tablet Take 1 tablet (20 mEq total) by mouth daily. 180 tablet 3  . Psyllium (NAT-RUL PSYLLIUM SEED HUSKS) 500 MG CAPS 1-2 po qid as needed 240 each 2  . sacubitril-valsartan (ENTRESTO) 97-103 MG Take 1 tablet by mouth 2 (two) times daily. 180 tablet 1  . sertraline (ZOLOFT) 100 MG tablet Take two tablets PO QDAY 60 tablet 1  . simvastatin (ZOCOR) 20 MG tablet TAKE 1 TABLET BY MOUTH EVERY DAY 90 tablet 2  . spironolactone (ALDACTONE) 25 MG tablet Take 0.5 tablets (12.5 mg total) by mouth daily. 45 tablet 3   No current facility-administered medications for this visit.      ALLERGIES: Fluoxetine; Cymbalta [duloxetine hcl]; and Penicillins  Family History  Problem Relation Age of Onset  . Depression Mother   . Anxiety disorder Mother   . Diabetes Mother   . Hypertension Mother   . ADD / ADHD Father   . Diabetes Father   . Hypertension Father   . Hypertension Other   . Diabetes Other   . Colitis Other   . Alcohol abuse Other     Social History   Socioeconomic History  . Marital status: Married    Spouse name: Not on file  . Number of children: Not on file  . Years of education: Not on file  . Highest education level: Not on file  Occupational History  . Not on file  Social Needs  . Financial resource strain: Not on file  . Food insecurity:    Worry: Not on file    Inability: Not on file  . Transportation needs:    Medical: Not on file    Non-medical: Not on file  Tobacco Use  . Smoking status: Former Smoker    Packs/day: 0.50    Types: Cigarettes    Last attempt to quit: 02/25/1997    Years since quitting: 20.7  . Smokeless tobacco: Never Used  . Tobacco comment: Married, lives with spouse (when he is not traveling) and son  Substance and  Sexual Activity  . Alcohol use: No  . Drug use: No  . Sexual activity: Yes    Partners: Male    Birth control/protection: None  Lifestyle  . Physical activity:    Days per week: Not on file    Minutes per session: Not on file  . Stress: Not on file  Relationships  . Social connections:  Talks on phone: Not on file    Gets together: Not on file    Attends religious service: Not on file    Active member of club or organization: Not on file    Attends meetings of clubs or organizations: Not on file    Relationship status: Not on file  . Intimate partner violence:    Fear of current or ex partner: Not on file    Emotionally abused: Not on file    Physically abused: Not on file    Forced sexual activity: Not on file  Other Topics Concern  . Not on file  Social History Narrative  . Not on file    Review of Systems  Constitutional: Negative.   HENT: Negative.   Eyes: Negative.   Respiratory: Negative.   Cardiovascular: Negative.   Gastrointestinal: Negative.   Endocrine: Negative.   Genitourinary: Negative.   Musculoskeletal: Negative.   Skin: Positive for rash.  Allergic/Immunologic: Negative.   Neurological: Negative.   Hematological: Negative.   Psychiatric/Behavioral: Negative.     PHYSICAL EXAMINATION:    BP 136/76 (BP Location: Right Arm, Patient Position: Sitting, Cuff Size: Large)   Pulse 76   Wt (!) 328 lb 6.4 oz (149 kg)   LMP 10/30/2017 (Approximate)   BMI 58.17 kg/m     General appearance: alert, cooperative and appears stated age Abdomen: soft, non-tender; non distended, no masses,  no organomegaly   ASSESSMENT H/O AUB S/P hysteroscopy, polypectomy, D&C. Pathology benign, secretory endometrium    PLAN Discussed option of do nothing, cyclic provera and mirena IUD Will treat with cyclic provera, discussed reasoning and use of provera  F/U in 6 months, calendar all bleeding   An After Visit Summary was printed and given to the patient.  ~15  minutes face to face time of which over 50% was spent in counseling.   CC: Dr Juleen China, Dr Percival Spanish

## 2017-12-02 ENCOUNTER — Other Ambulatory Visit: Payer: Self-pay

## 2017-12-02 ENCOUNTER — Ambulatory Visit (INDEPENDENT_AMBULATORY_CARE_PROVIDER_SITE_OTHER): Payer: Medicare Other | Admitting: Obstetrics and Gynecology

## 2017-12-02 ENCOUNTER — Encounter: Payer: Self-pay | Admitting: Obstetrics and Gynecology

## 2017-12-02 VITALS — BP 136/76 | HR 76 | Wt 328.4 lb

## 2017-12-02 DIAGNOSIS — N939 Abnormal uterine and vaginal bleeding, unspecified: Secondary | ICD-10-CM | POA: Diagnosis not present

## 2017-12-02 DIAGNOSIS — Z9889 Other specified postprocedural states: Secondary | ICD-10-CM | POA: Diagnosis not present

## 2017-12-02 MED ORDER — MEDROXYPROGESTERONE ACETATE 5 MG PO TABS: ORAL_TABLET | ORAL | 1 refills | Status: DC

## 2017-12-02 NOTE — Patient Instructions (Signed)
Take provera 5 mg x 5 days every other month if no spontaneous menses. Calendar when you take the provera and any bleeding you do.  Take the provera starting on November 1, then January 1, then March 1

## 2017-12-03 DIAGNOSIS — I5022 Chronic systolic (congestive) heart failure: Secondary | ICD-10-CM | POA: Diagnosis not present

## 2017-12-04 DIAGNOSIS — I5022 Chronic systolic (congestive) heart failure: Secondary | ICD-10-CM | POA: Diagnosis not present

## 2017-12-04 DIAGNOSIS — M47816 Spondylosis without myelopathy or radiculopathy, lumbar region: Secondary | ICD-10-CM | POA: Diagnosis not present

## 2017-12-04 DIAGNOSIS — G894 Chronic pain syndrome: Secondary | ICD-10-CM | POA: Diagnosis not present

## 2017-12-04 DIAGNOSIS — M4802 Spinal stenosis, cervical region: Secondary | ICD-10-CM | POA: Diagnosis not present

## 2017-12-04 HISTORY — DX: Spondylosis without myelopathy or radiculopathy, lumbar region: M47.816

## 2017-12-05 ENCOUNTER — Ambulatory Visit: Payer: Medicare Other | Admitting: Cardiovascular Disease

## 2017-12-05 ENCOUNTER — Encounter

## 2017-12-05 NOTE — Telephone Encounter (Signed)
Called patient l/m to call office  

## 2017-12-08 DIAGNOSIS — I5022 Chronic systolic (congestive) heart failure: Secondary | ICD-10-CM | POA: Diagnosis not present

## 2017-12-10 DIAGNOSIS — I5022 Chronic systolic (congestive) heart failure: Secondary | ICD-10-CM | POA: Diagnosis not present

## 2017-12-10 DIAGNOSIS — M47816 Spondylosis without myelopathy or radiculopathy, lumbar region: Secondary | ICD-10-CM | POA: Diagnosis not present

## 2017-12-11 DIAGNOSIS — I5022 Chronic systolic (congestive) heart failure: Secondary | ICD-10-CM | POA: Diagnosis not present

## 2017-12-12 ENCOUNTER — Other Ambulatory Visit (HOSPITAL_COMMUNITY): Payer: Self-pay | Admitting: Psychiatry

## 2017-12-12 DIAGNOSIS — F331 Major depressive disorder, recurrent, moderate: Secondary | ICD-10-CM

## 2017-12-15 DIAGNOSIS — I5022 Chronic systolic (congestive) heart failure: Secondary | ICD-10-CM | POA: Diagnosis not present

## 2017-12-19 DIAGNOSIS — M47816 Spondylosis without myelopathy or radiculopathy, lumbar region: Secondary | ICD-10-CM | POA: Diagnosis not present

## 2017-12-24 DIAGNOSIS — I5022 Chronic systolic (congestive) heart failure: Secondary | ICD-10-CM | POA: Diagnosis not present

## 2017-12-25 DIAGNOSIS — I5022 Chronic systolic (congestive) heart failure: Secondary | ICD-10-CM | POA: Diagnosis not present

## 2017-12-26 DIAGNOSIS — M47816 Spondylosis without myelopathy or radiculopathy, lumbar region: Secondary | ICD-10-CM | POA: Diagnosis not present

## 2017-12-27 ENCOUNTER — Other Ambulatory Visit: Payer: Self-pay | Admitting: Cardiology

## 2017-12-29 DIAGNOSIS — I5022 Chronic systolic (congestive) heart failure: Secondary | ICD-10-CM | POA: Diagnosis not present

## 2017-12-29 NOTE — Telephone Encounter (Signed)
Rx(s) sent to pharmacy electronically.  

## 2017-12-30 ENCOUNTER — Encounter (HOSPITAL_COMMUNITY): Payer: Self-pay | Admitting: Psychiatry

## 2017-12-30 ENCOUNTER — Ambulatory Visit (INDEPENDENT_AMBULATORY_CARE_PROVIDER_SITE_OTHER): Payer: Medicare Other | Admitting: Psychiatry

## 2017-12-30 DIAGNOSIS — F331 Major depressive disorder, recurrent, moderate: Secondary | ICD-10-CM

## 2017-12-30 DIAGNOSIS — F419 Anxiety disorder, unspecified: Secondary | ICD-10-CM | POA: Diagnosis not present

## 2017-12-30 MED ORDER — LAMOTRIGINE 200 MG PO TABS: 200.0000 mg | ORAL_TABLET | Freq: Every day | ORAL | 1 refills | Status: DC

## 2017-12-30 MED ORDER — SERTRALINE HCL 100 MG PO TABS: ORAL_TABLET | ORAL | 1 refills | Status: DC

## 2017-12-30 MED ORDER — CLONAZEPAM 1 MG PO TABS: ORAL_TABLET | ORAL | 1 refills | Status: DC

## 2017-12-30 MED ORDER — BUSPIRONE HCL 10 MG PO TABS: 10.0000 mg | ORAL_TABLET | Freq: Two times a day (BID) | ORAL | 1 refills | Status: DC

## 2017-12-30 NOTE — Progress Notes (Signed)
BH MD/PA/NP OP Progress Note  12/30/2017 3:32 PM [name]  MRN:  [id number]  Chief Complaint: Patient returns for medication management appointment  HPI: 52 year old female, unemployed, lives with husband and teenaged son.  History of major depression. History of chronic medical conditions including cardiomyopathy, CHF, hypertension, obesity. Reports some persistent anxiety and some depression related to chronic stressors.  Husband is currently not working and so finances are a challenge.  This has been compounded by 1 of their cars breaking down and being too expensive to fix at this time.  Overall she states she is doing well.  Continues to grieve the death of her mother who passed away several months ago.  Often feels anxious and ruminates about her financial concerns.  She does not, however, endorse significant neurovegetative symptoms and presents euthymic during this visit.  She spoke about finding a ring which is a family heirloom.  States she thinks she lost it during a trip but later found it at home in the laundry basket.  States she feels that this was a sign that she is being watched over by her deceased grandmother and mother. She has been going to cardiac rehab and states that her stamina has improved significantly.  She can now walk around the gym's track several times without significant dyspnea which in the past she could not even walk around once without having to stop.  She is also lost 10 pounds.  I provided praise and encouraged her to continue these efforts.  Denies medication side effects.  Denies suicidal ideations. Visit Diagnosis:    ICD-10-CM   1. Moderate episode of recurrent major depressive disorder (HCC) F33.1 lamoTRIgine (LAMICTAL) 200 MG tablet    Past Psychiatric History:   Past Medical History:  Past Medical History:  Diagnosis Date  . ADD (attention deficit disorder)   . Anxiety   . Arthritis    both knees right worse than left  . Cardiomyopathy,  peripartum, postpartum   . Carpal tunnel syndrome of right wrist   . CHF (congestive heart failure) ([place])   . Chronic systolic CHF (congestive heart failure), NYHA class 3 ([place]) 12/06/2013  . Depression   . Dyspnea   . Essential hypertension, benign   . Gallstones   . Migraines   . Morbid obesity ([place])   . OSA (obstructive sleep apnea)   . Plantar fasciitis   . Pre-diabetes   . Prediabetes   . Spinal stenosis   . Spinal stenosis    back pain    Past Surgical History:  Procedure Laterality Date  . CHOLECYSTECTOMY    . DILATATION & CURETTAGE/HYSTEROSCOPY WITH MYOSURE N/A 10/31/2017   Procedure: DILATATION & CURETTAGE/HYSTEROSCOPY WITH MYOSURE;  Surgeon: Salvadore Dom, MD;  Location: [facility];  Service: Gynecology;  Laterality: N/A;  . RIGHT/LEFT HEART CATH AND CORONARY ANGIOGRAPHY N/A 04/11/2016   Procedure: Right/Left Heart Cath and Coronary Angiography;  Surgeon: Burnell Blanks, MD;  Location: [facility];  Service: Cardiovascular;  Laterality: N/A;  . sonogram for blood clots     no blockages    Family Psychiatric History:   Family History:  Family History  Problem Relation Age of Onset  . Depression Mother   . Anxiety disorder Mother   . Diabetes Mother   . Hypertension Mother   . ADD / ADHD Father   . Diabetes Father   . Hypertension Father   . Hypertension Other   . Diabetes Other   . Colitis Other   .  Alcohol abuse Other     Social History:  Social History   Socioeconomic History  . Marital status: Married    Spouse name: Not on file  . Number of children: Not on file  . Years of education: Not on file  . Highest education level: Not on file  Occupational History  . Not on file  Social Needs  . Financial resource strain: Not on file  . Food insecurity:    Worry: Not on file    Inability: Not on file  . Transportation needs:    Medical: Not on file    Non-medical: Not on file  Tobacco Use  . Smoking status: Former Smoker     Packs/day: 0.50    Types: Cigarettes    Last attempt to quit: 02/25/1997    Years since quitting: 20.8  . Smokeless tobacco: Never Used  . Tobacco comment: Married, lives with spouse (when he is not traveling) and son  Substance and Sexual Activity  . Alcohol use: No  . Drug use: No  . Sexual activity: Yes    Partners: Male    Birth control/protection: None  Lifestyle  . Physical activity:    Days per week: Not on file    Minutes per session: Not on file  . Stress: Not on file  Relationships  . Social connections:    Talks on phone: Not on file    Gets together: Not on file    Attends religious service: Not on file    Active member of club or organization: Not on file    Attends meetings of clubs or organizations: Not on file    Relationship status: Not on file  Other Topics Concern  . Not on file  Social History Narrative  . Not on file    Allergies:  Allergies  Allergen Reactions  . Fluoxetine Other (See Comments)    More depressed  . Cymbalta [Duloxetine Hcl] Other (See Comments)    depressed  . Penicillins     REACTION: rash Has patient had a PCN reaction causing immediate rash, facial/tongue/throat swelling, SOB or lightheadedness with hypotension: YES Has patient had a PCN reaction causing severe rash involving mucus membranes or skin necrosis: NO Has patient had a PCN reaction that required hospitalization: YES Has patient had a PCN reaction occurring within the last 10 years: NO If all of the above answers are "NO", then may proceed with Cephalosporin use.     Metabolic Disorder Labs: Lab Results  Component Value Date   HGBA1C 6.5 (H) 10/29/2017   MPG 139.85 10/29/2017   MPG 125.5 10/19/2016   MPG 125.5 10/19/2016   No results found for: PROLACTIN Lab Results  Component Value Date   CHOL 204 (H) 07/18/2016   TRIG 123.0 07/18/2016   HDL 51.10 07/18/2016   CHOLHDL 4 07/18/2016   VLDL 24.6 07/18/2016   LDLCALC 128 (H) 07/18/2016   LDLCALC 78  11/08/2013   Lab Results  Component Value Date   TSH 4.18 07/14/2017   TSH 0.804 10/19/2016    Therapeutic Level Labs: No results found for: LITHIUM No results found for: VALPROATE No components found for:  CBMZ  Current Medications: Current Outpatient Medications  Medication Sig Dispense Refill  . amLODipine (NORVASC) 10 MG tablet TAKE 1 TABLET BY MOUTH EVERY DAY 90 tablet 1  . busPIRone (BUSPAR) 10 MG tablet Take 1 tablet (10 mg total) by mouth 2 (two) times daily. 60 tablet 1  . carvedilol (COREG) 12.5 MG tablet  Take 1 tablet (12.5 mg total) by mouth 2 (two) times daily with a meal. 180 tablet 1  . carvedilol (COREG) 25 MG tablet Take 1 tablet (25 mg total) by mouth 2 (two) times daily with a meal. 180 tablet 1  . clonazePAM (KLONOPIN) 1 MG tablet Take .5 mg qd prn anxiety 21 tablet 1  . clotrimazole-betamethasone (LOTRISONE) cream Apply 1 application topically 2 (two) times daily. 45 g 2  . dicyclomine (BENTYL) 20 MG tablet Take 1 tablet (20 mg total) by mouth every 6 (six) hours. 30 tablet 0  . furosemide (LASIX) 40 MG tablet Take 1 tablet (40 mg total) by mouth 2 (two) times daily. Take as directed 30 tablet 0  . furosemide (LASIX) 40 MG tablet TAKE 1 TABLET BY MOUTH TWICE A DAY 180 tablet 3  . ibuprofen (ADVIL,MOTRIN) 800 MG tablet Take 1 tablet (800 mg total) by mouth every 8 (eight) hours as needed for headache or mild pain. 90 tablet 1  . lamoTRIgine (LAMICTAL) 200 MG tablet Take 1 tablet (200 mg total) by mouth daily. 30 tablet 1  . medroxyPROGESTERone (PROVERA) 5 MG tablet Take one tablet a day for 5 days every other month if no spontaneous cycle. Start on November, 1,2019. 15 tablet 1  . metFORMIN (GLUCOPHAGE XR) 750 MG 24 hr tablet Two tablets at bedtime. 120 tablet 1  . potassium chloride SA (KLOR-CON M20) 20 MEQ tablet Take 1 tablet (20 mEq total) by mouth daily. 180 tablet 3  . Psyllium (NAT-RUL PSYLLIUM SEED HUSKS) 500 MG CAPS 1-2 po qid as needed 240 each 2  .  sacubitril-valsartan (ENTRESTO) 97-103 MG Take 1 tablet by mouth 2 (two) times daily. 180 tablet 1  . sertraline (ZOLOFT) 100 MG tablet Take two tablets PO QDAY 60 tablet 1  . simvastatin (ZOCOR) 20 MG tablet TAKE 1 TABLET BY MOUTH EVERY DAY 90 tablet 2  . spironolactone (ALDACTONE) 25 MG tablet Take 0.5 tablets (12.5 mg total) by mouth daily. 45 tablet 3   No current facility-administered medications for this visit.      Musculoskeletal: Strength & Muscle Tone: within normal limits Gait & Station: normal Patient leans: N/A  Psychiatric Specialty Exam: ROS no chest pain, no shortness of breath, no vomiting  Blood pressure 112/81, pulse (!) 52, height 5' 3.5" (1.613 m), weight (!) 146.5 kg.Body mass index is 56.32 kg/m.  General Appearance: Well Groomed  Eye Contact:  Good  Speech:  Normal Rate  Volume:  Normal  Mood:  Stable, overall euthymic  Affect:  Reactive, full in range, briefly tearful when speaking mother, who passed away a few months ago  Thought Process:  Linear and Descriptions of Associations: Intact  Orientation:  Full (Time, Place, and Person)  Thought Content: No hallucinations, no delusions   Suicidal Thoughts:  No denies suicidal or self-injurious ideations, no homicidal ideations  Homicidal Thoughts:  No  Memory:  Recent and remote grossly intact  Judgement:  Good  Insight:  Present  Psychomotor Activity:  Normal  Concentration:  Concentration: Good and Attention Span: Good  Recall:  Good  Fund of Knowledge: Good  Language: Good  Akathisia:  Negative  Handed:  Right  AIMS (if indicated): AIMS test not done today/patient denies abnormal movements and none are noted  Assets:  Communication Skills Desire for Improvement Resilience Social Support  ADL's:  Intact  Cognition: WNL  Sleep:  Good   Screenings: AUDIT     Admission (Discharged) from 12/05/2013 in [facility]  Alcohol Use Disorder Identification Test Final  Score (AUDIT)  0    PHQ2-9     Office Visit from 11/07/2017 in [facility] Visit from 10/18/2016 in [facility] from 06/26/2016 in [facility] Visit from 07/19/2015 in [facility] Visit from 07/05/2015 in [facility] at [facility]  PHQ-2 Total Score  2  5  4  2   0  PHQ-9 Total Score  11  8  21  5   -       Assessment and Plan: Patient presents stable, currently euthymic, with a reactive/full range of affect.  No suicidal ideations.  She does continue to express anxiety and some sadness related to financial difficulties and to the death of her mother several months ago.  Of note, she seems very motivated in cardiac rehab she has been participating in with good results including some weight loss and decreased dyspnea. Denies medication side effects. Continue BuSpar 10 mg twice daily, Klonopin 0.5 mg daily PRN for anxiety, Lamictal 200 mg daily for mood disorder, Zoloft 200 mg daily for mood disorder/depression.  We will see patient in 6 weeks.  She agrees to contact clinic sooner should to be any worsening or concerns prior.    Jenne Campus, MD 12/30/2017, 3:32 PM

## 2017-12-31 DIAGNOSIS — I5022 Chronic systolic (congestive) heart failure: Secondary | ICD-10-CM | POA: Diagnosis not present

## 2018-01-01 DIAGNOSIS — I5022 Chronic systolic (congestive) heart failure: Secondary | ICD-10-CM | POA: Diagnosis not present

## 2018-01-05 DIAGNOSIS — I5022 Chronic systolic (congestive) heart failure: Secondary | ICD-10-CM | POA: Diagnosis not present

## 2018-01-07 ENCOUNTER — Other Ambulatory Visit: Payer: Self-pay | Admitting: Family Medicine

## 2018-01-07 DIAGNOSIS — M545 Low back pain, unspecified: Secondary | ICD-10-CM

## 2018-01-07 DIAGNOSIS — I5022 Chronic systolic (congestive) heart failure: Secondary | ICD-10-CM | POA: Diagnosis not present

## 2018-01-07 DIAGNOSIS — G8929 Other chronic pain: Secondary | ICD-10-CM

## 2018-01-08 DIAGNOSIS — I5022 Chronic systolic (congestive) heart failure: Secondary | ICD-10-CM | POA: Diagnosis not present

## 2018-01-12 DIAGNOSIS — I5022 Chronic systolic (congestive) heart failure: Secondary | ICD-10-CM | POA: Diagnosis not present

## 2018-01-14 DIAGNOSIS — I5022 Chronic systolic (congestive) heart failure: Secondary | ICD-10-CM | POA: Diagnosis not present

## 2018-01-15 ENCOUNTER — Other Ambulatory Visit (HOSPITAL_COMMUNITY): Payer: Self-pay

## 2018-01-15 DIAGNOSIS — I5022 Chronic systolic (congestive) heart failure: Secondary | ICD-10-CM | POA: Diagnosis not present

## 2018-01-16 ENCOUNTER — Ambulatory Visit (HOSPITAL_COMMUNITY): Payer: Medicare Other | Attending: Cardiology

## 2018-01-16 ENCOUNTER — Other Ambulatory Visit: Payer: Self-pay

## 2018-01-16 DIAGNOSIS — I5022 Chronic systolic (congestive) heart failure: Secondary | ICD-10-CM

## 2018-01-16 MED ORDER — PERFLUTREN LIPID MICROSPHERE
1.0000 mL | INTRAVENOUS | Status: AC | PRN
  Administered 2018-01-16: 3 mL via INTRAVENOUS

## 2018-01-19 DIAGNOSIS — I5022 Chronic systolic (congestive) heart failure: Secondary | ICD-10-CM | POA: Diagnosis not present

## 2018-01-21 ENCOUNTER — Other Ambulatory Visit: Payer: Self-pay | Admitting: Physician Assistant

## 2018-01-21 DIAGNOSIS — I5022 Chronic systolic (congestive) heart failure: Secondary | ICD-10-CM | POA: Diagnosis not present

## 2018-01-21 NOTE — Progress Notes (Signed)
She can come back to discuss with me next week.

## 2018-01-27 ENCOUNTER — Ambulatory Visit (HOSPITAL_COMMUNITY): Payer: Medicare Other | Admitting: Psychiatry

## 2018-01-27 NOTE — Progress Notes (Signed)
HPI The patient presents for evaluation of cardiomyopathy.   She had cath 04/11/16. This revealed normal coronaries, EF 25-30%, mild pulmonary HTN, and elevated LVEDP.  She was in the hospital in August 2018.  She had acute on chronic systolic and diastolic HF. She presents for follow up.   EF last month was 30 - 35%.  She came in today for follow-up.  She is very concerned because the last time he said her EF was about 35 to 40%.  However, going back this looks about the same as it did in 2017 in 2015.  She is much improved symptomatically.  She is doing cardiac rehab in [facility].  She is able to walk around the track 7.  Previously she was only able to do it once.  Her weight is down about 20 pounds.  She gets winded when she walks around the track as much as she is but she is not describing any resting shortness of breath, PND or orthopnea.  She has not had any new palpitations, presyncope or syncope.  She denies any chest pressure, neck or arm discomfort.     Allergies  Allergen Reactions  . Fluoxetine Other (See Comments)    More depressed  . Cymbalta [Duloxetine Hcl] Other (See Comments)    depressed  . Penicillins     REACTION: rash Has patient had a PCN reaction causing immediate rash, facial/tongue/throat swelling, SOB or lightheadedness with hypotension: YES Has patient had a PCN reaction causing severe rash involving mucus membranes or skin necrosis: NO Has patient had a PCN reaction that required hospitalization: YES Has patient had a PCN reaction occurring within the last 10 years: NO If all of the above answers are "NO", then may proceed with Cephalosporin use.     Current Outpatient Medications  Medication Sig Dispense Refill  . amLODipine (NORVASC) 5 MG tablet Take 1 tablet (5 mg total) by mouth daily. 90 tablet 3  . busPIRone (BUSPAR) 10 MG tablet Take 1 tablet (10 mg total) by mouth 2 (two) times daily. 60 tablet 1  . carvedilol (COREG) 12.5 MG tablet Take 1 tablet  (12.5 mg total) by mouth 2 (two) times daily with a meal. 180 tablet 1  . carvedilol (COREG) 25 MG tablet Take 1 tablet (25 mg total) by mouth 2 (two) times daily with a meal. 180 tablet 1  . clonazePAM (KLONOPIN) 1 MG tablet Take .5 mg qd prn anxiety 21 tablet 1  . clotrimazole-betamethasone (LOTRISONE) cream Apply 1 application topically 2 (two) times daily. 45 g 2  . dicyclomine (BENTYL) 20 MG tablet Take 1 tablet (20 mg total) by mouth every 6 (six) hours. 30 tablet 0  . furosemide (LASIX) 40 MG tablet Take 1 tablet (40 mg total) by mouth 2 (two) times daily. Take as directed 30 tablet 0  . ibuprofen (ADVIL,MOTRIN) 800 MG tablet TAKE 1 TABLET BY MOUTH EVERY 8 HOURS AS NEEDED FOR HEADACHE OR MILD PAIN 90 tablet 1  . lamoTRIgine (LAMICTAL) 200 MG tablet Take 1 tablet (200 mg total) by mouth daily. 30 tablet 1  . medroxyPROGESTERone (PROVERA) 5 MG tablet Take one tablet a day for 5 days every other month if no spontaneous cycle. Start on November, 1,2019. 15 tablet 1  . metFORMIN (GLUCOPHAGE XR) 750 MG 24 hr tablet Two tablets at bedtime. 120 tablet 1  . potassium chloride SA (KLOR-CON M20) 20 MEQ tablet Take 1 tablet (20 mEq total) by mouth daily. 180 tablet 3  .  Psyllium (NAT-RUL PSYLLIUM SEED HUSKS) 500 MG CAPS 1-2 po qid as needed 240 each 2  . sacubitril-valsartan (ENTRESTO) 97-103 MG Take 1 tablet by mouth 2 (two) times daily. 180 tablet 1  . sertraline (ZOLOFT) 100 MG tablet Take two tablets PO QDAY 60 tablet 1  . simvastatin (ZOCOR) 20 MG tablet TAKE 1 TABLET BY MOUTH EVERY DAY 90 tablet 2  . spironolactone (ALDACTONE) 25 MG tablet Take 1 tablet (25 mg total) by mouth daily. 90 tablet 3   No current facility-administered medications for this visit.     Past Medical History:  Diagnosis Date  . ADD (attention deficit disorder)   . Anxiety   . Arthritis    both knees right worse than left  . Cardiomyopathy, peripartum, postpartum   . Carpal tunnel syndrome of right wrist   . CHF  (congestive heart failure) ([place])   . Chronic systolic CHF (congestive heart failure), NYHA class 3 ([place]) 12/06/2013  . Depression   . Dyspnea   . Essential hypertension, benign   . Gallstones   . Migraines   . Morbid obesity ([place])   . OSA (obstructive sleep apnea)   . Plantar fasciitis   . Pre-diabetes   . Prediabetes   . Spinal stenosis   . Spinal stenosis    back pain    Past Surgical History:  Procedure Laterality Date  . CHOLECYSTECTOMY    . DILATATION & CURETTAGE/HYSTEROSCOPY WITH MYOSURE N/A 10/31/2017   Procedure: DILATATION & CURETTAGE/HYSTEROSCOPY WITH MYOSURE;  Surgeon: Salvadore Dom, MD;  Location: [facility];  Service: Gynecology;  Laterality: N/A;  . RIGHT/LEFT HEART CATH AND CORONARY ANGIOGRAPHY N/A 04/11/2016   Procedure: Right/Left Heart Cath and Coronary Angiography;  Surgeon: Burnell Blanks, MD;  Location: [facility];  Service: Cardiovascular;  Laterality: N/A;  . sonogram for blood clots     no blockages    ROS:   As stated in the HPI and negative for all other systems.   PHYSICAL EXAM BP 112/70 (BP Location: Left Wrist, Patient Position: Sitting, Cuff Size: Normal)   Pulse 83   Ht 5\' 3"  (1.6 m)   Wt (!) 325 lb (147.4 kg)   BMI 57.57 kg/m   GENERAL:  Well appearing NECK:  No jugular venous distention, waveform within normal limits, carotid upstroke brisk and symmetric, no bruits, no thyromegaly LUNGS:  Clear to auscultation bilaterally CHEST:  Unremarkable HEART:  PMI not displaced or sustained,S1 and S2 within normal limits, no S3, no S4, no clicks, no rubs, no murmurs ABD:  Flat, positive bowel sounds normal in frequency in pitch, no bruits, no rebound, no guarding, no midline pulsatile mass, no hepatomegaly, no splenomegaly EXT:  2 plus pulses throughout, no edema, no cyanosis no clubbing    EKG:   NA  Lab Results  Component Value Date   TSH 4.18 07/14/2017     ASSESSMENT AND PLAN  DYSPNEA -  Her breathing is improved  compared to baseline.  She has chronic systolic and diastolic heart failure.  However, today I think she is euvolemic.  I will change medications as described below.  OBESITY - I am proud of her weight loss and I encouraged slow and steadily that she continue with this.   CHRONIC SYSTOLIC HF -  I am going to reduce her Norvasc to 5 mg daily in favor of increasing her spironolactone to 25 mg daily. I will follow guidelines for spironolactone follow up in heart failure.  (Check potassium levels  and  renal function 3--4 days and 1 week, then at least monthly for first 3 months and every 3 months thereafter after initiation of spironolactone.)  ESSENTIAL HYPERTENSION, BENIGN -  Again I will be managing this in the context of treating her heart failure and trying to bring down her amlodipine in favor of the optimal medical therapy for heart failure.   ANXIETY DEPRESSION - She is followed by Dr. Juleen China.   SLEEP APNEA - She has an appointment for follow-up of this.  She needs to continue in [facility] pulmonary rehab.  We will make a referral for this.

## 2018-01-29 ENCOUNTER — Encounter: Payer: Self-pay | Admitting: Cardiology

## 2018-01-29 ENCOUNTER — Ambulatory Visit (INDEPENDENT_AMBULATORY_CARE_PROVIDER_SITE_OTHER): Payer: Medicare Other | Admitting: Cardiology

## 2018-01-29 VITALS — BP 112/70 | HR 83 | Ht 63.0 in | Wt 325.0 lb

## 2018-01-29 DIAGNOSIS — Z79899 Other long term (current) drug therapy: Secondary | ICD-10-CM | POA: Diagnosis not present

## 2018-01-29 DIAGNOSIS — I5022 Chronic systolic (congestive) heart failure: Secondary | ICD-10-CM

## 2018-01-29 MED ORDER — AMLODIPINE BESYLATE 5 MG PO TABS: 5.0000 mg | ORAL_TABLET | Freq: Every day | ORAL | 3 refills | Status: DC

## 2018-01-29 MED ORDER — SPIRONOLACTONE 25 MG PO TABS: 25.0000 mg | ORAL_TABLET | Freq: Every day | ORAL | 3 refills | Status: DC

## 2018-01-29 NOTE — Patient Instructions (Addendum)
Medication Instructions:  INCREASE- Spironolactone 25 mg daily DECREASE- Amlodipine 5 mg daily  If you need a refill on your cardiac medications before your next appointment, please call your pharmacy.  Labwork: BMP in 2 weeks  If you have labs (blood work) drawn today and your tests are completely normal, you will receive your results only by: [name] MyChart Message (if you have MyChart) OR . A paper copy in the mail If you have any lab test that is abnormal or we need to change your treatment, we will call you to review the results.  Testing/Procedures: None Ordered  Follow-Up: . You will need a follow up appointment in 2 Months.   At [facility], you and your health needs are our priority.  As part of our continuing mission to provide you with exceptional heart care, we have created designated Provider Care Teams.  These Care Teams include your primary Cardiologist (physician) and Advanced Practice Providers (APPs -  Physician Assistants and Nurse Practitioners) who all work together to provide you with the care you need, when you need it.   Thank you for choosing [facility]!!

## 2018-02-02 DIAGNOSIS — I5022 Chronic systolic (congestive) heart failure: Secondary | ICD-10-CM | POA: Diagnosis not present

## 2018-02-03 ENCOUNTER — Ambulatory Visit: Payer: Medicare Other | Admitting: Cardiology

## 2018-02-04 DIAGNOSIS — I5022 Chronic systolic (congestive) heart failure: Secondary | ICD-10-CM | POA: Diagnosis not present

## 2018-02-05 DIAGNOSIS — I5022 Chronic systolic (congestive) heart failure: Secondary | ICD-10-CM | POA: Diagnosis not present

## 2018-02-06 ENCOUNTER — Ambulatory Visit (INDEPENDENT_AMBULATORY_CARE_PROVIDER_SITE_OTHER): Payer: Medicare Other | Admitting: Family Medicine

## 2018-02-06 ENCOUNTER — Encounter: Payer: Self-pay | Admitting: Family Medicine

## 2018-02-06 VITALS — BP 124/74 | HR 77 | Temp 98.6°F | Ht 63.0 in | Wt 321.4 lb

## 2018-02-06 DIAGNOSIS — G4733 Obstructive sleep apnea (adult) (pediatric): Secondary | ICD-10-CM | POA: Diagnosis not present

## 2018-02-06 DIAGNOSIS — Z9989 Dependence on other enabling machines and devices: Secondary | ICD-10-CM

## 2018-02-06 DIAGNOSIS — I152 Hypertension secondary to endocrine disorders: Secondary | ICD-10-CM

## 2018-02-06 DIAGNOSIS — Z6841 Body Mass Index (BMI) 40.0 and over, adult: Secondary | ICD-10-CM | POA: Diagnosis not present

## 2018-02-06 DIAGNOSIS — E1159 Type 2 diabetes mellitus with other circulatory complications: Secondary | ICD-10-CM

## 2018-02-06 DIAGNOSIS — E1169 Type 2 diabetes mellitus with other specified complication: Secondary | ICD-10-CM

## 2018-02-06 DIAGNOSIS — N6009 Solitary cyst of unspecified breast: Secondary | ICD-10-CM

## 2018-02-06 DIAGNOSIS — F3341 Major depressive disorder, recurrent, in partial remission: Secondary | ICD-10-CM

## 2018-02-06 DIAGNOSIS — I10 Essential (primary) hypertension: Secondary | ICD-10-CM

## 2018-02-06 DIAGNOSIS — E1165 Type 2 diabetes mellitus with hyperglycemia: Secondary | ICD-10-CM

## 2018-02-06 DIAGNOSIS — F902 Attention-deficit hyperactivity disorder, combined type: Secondary | ICD-10-CM

## 2018-02-06 DIAGNOSIS — E785 Hyperlipidemia, unspecified: Secondary | ICD-10-CM

## 2018-02-06 DIAGNOSIS — M47816 Spondylosis without myelopathy or radiculopathy, lumbar region: Secondary | ICD-10-CM | POA: Diagnosis not present

## 2018-02-06 DIAGNOSIS — I428 Other cardiomyopathies: Secondary | ICD-10-CM | POA: Diagnosis not present

## 2018-02-06 LAB — HEMOGLOBIN A1C: Hgb A1c MFr Bld: 6.4 % (ref 4.6–6.5)

## 2018-02-06 LAB — COMPREHENSIVE METABOLIC PANEL
ALT: 22 U/L (ref 0–35)
AST: 25 U/L (ref 0–37)
Albumin: 4.1 g/dL (ref 3.5–5.2)
Alkaline Phosphatase: 41 U/L (ref 39–117)
BUN: 21 mg/dL (ref 6–23)
CO2: 27 mEq/L (ref 19–32)
Calcium: 8.8 mg/dL (ref 8.4–10.5)
Chloride: 101 mEq/L (ref 96–112)
Creatinine, Ser: 0.71 mg/dL (ref 0.40–1.20)
GFR: 91.63 mL/min (ref >60.00)
Glucose, Bld: 137 mg/dL — ABNORMAL HIGH (ref 70–99)
Potassium: 3.9 mEq/L (ref 3.5–5.1)
Sodium: 139 mEq/L (ref 135–145)
Total Bilirubin: 0.7 mg/dL (ref 0.2–1.2)
Total Protein: 7 g/dL (ref 6.0–8.3)

## 2018-02-06 NOTE — Progress Notes (Signed)
[name] is a 52 y.o. female is here for follow up.  History of Present Illness:   [name], CMA acting as scribe for Dr. Briscoe Deutscher.   HPI: Patient in office for follow up on Blood sugars. Last A1C was 6.5 on 10/29/17. Tolerating Metformin, taking it once daily. Helps with constipation. Down < 10 pounds. Working on diet changes and doing cardiopulmonary rehab. Feels stronger, now able to walk 8 laps. No CP. Still with some SOB, but admits that anxiety likely playing a role. Still on Entresto. Norvasc recently increased. Latest ECHO stable.  Anxiety increased. Her mother died early this year. She does not feel that her extended family has been supportive as no one has called. She is worried that she may die from her multiple medical issues and that her autistic son will not have anyone. Her husband does like with them now, but she says that her son does not like his dad. Husband also with medical issues but no insurance, so struggling.   Ceiling in sunroom still an issue and she is struggling to get someone to fix it. She is very worried about the mold/mildew exposure.  She points out 2 masses on her chest, between her breasts. The newest one is flesh-colored and has drained thick material in the past. The other has been there for years, is around the xiphoid process. She has been told in the past that it is part of her anatomy. She is worried that it is related to her SOB. Previous CT of abdomen and chest reviewed and without findings that may be related to this mass.  Health Maintenance Due  Topic Date Due  . FOOT EXAM  05/29/1975  . OPHTHALMOLOGY EXAM  05/29/1975   Depression screen [facility] 2/9 11/07/2017 10/18/2016 06/26/2016  Decreased Interest 1 2 2   Down, Depressed, Hopeless 1 3 2   PHQ - 2 Score 2 5 4   Altered sleeping 0 0 2  Tired, decreased energy 3 0 3  Change in appetite 2 1 3   Feeling bad or failure about yourself  1 1 3   Trouble concentrating 3 0 3  Moving slowly or  fidgety/restless 0 0 3  Suicidal thoughts 0 1 0  PHQ-9 Score 11 8 21   Difficult doing work/chores Somewhat difficult Not difficult at all Extremely dIfficult  Some recent data might be hidden   PMHx, SurgHx, SocialHx, FamHx, Medications, and Allergies were reviewed in the Visit Navigator and updated as appropriate.   Patient Active Problem List   Diagnosis Date Noted  . BMI 50.0-59.9, adult ([place]) 02/06/2018  . Spondylosis without myelopathy or radiculopathy, lumbar region 12/04/2017  . Type 2 diabetes mellitus with hyperglycemia ([place])   . SOB (shortness of breath) 06/22/2016  . History of smoking 06/22/2016  . Normal coronary arteries 04/17/2016  . Obstructive sleep apnea syndrome 10/28/2014  . Major depressive disorder 10/28/2014  . Chronic systolic CHF (congestive heart failure), NYHA class 3 ([place]) 12/06/2013  . Hyperlipidemia associated with type 2 diabetes mellitus ([place]) 04/04/2011  . Hypertension associated with diabetes ([place]) 06/03/2008  . Morbid obesity ([place]) 07/12/2006  . NICM (nonischemic cardiomyopathy) ([place]) 07/12/2006  . Attention deficit disorder 07/12/2006   Social History   Tobacco Use  . Smoking status: Former Smoker    Packs/day: 0.50    Types: Cigarettes    Last attempt to quit: 02/25/1997    Years since quitting: 20.9  . Smokeless tobacco: Never Used  . Tobacco comment: Married, lives with spouse (when he  is not traveling) and son  Substance Use Topics  . Alcohol use: No  . Drug use: No   Current Medications and Allergies:   .  amLODipine (NORVASC) 10 MG tablet, Take 1 tablet by mouth daily., Disp: , Rfl:  .  busPIRone (BUSPAR) 10 MG tablet, Take 1 tablet (10 mg total) by mouth 2 (two) times daily., Disp: 60 tablet, Rfl: 1 .  carvedilol (COREG) 12.5 MG tablet, Take 1 tablet (12.5 mg total) by mouth 2 (two) times daily with a meal., Disp: 180 tablet, Rfl: 1 .  carvedilol (COREG) 25 MG tablet, Take 1 tablet (25 mg total) by mouth 2 (two) times daily with a  meal., Disp: 180 tablet, Rfl: 1 .  clonazePAM (KLONOPIN) 1 MG tablet, Take .5 mg qd prn anxiety, Disp: 21 tablet, Rfl: 1 .  clotrimazole-betamethasone (LOTRISONE) cream, Apply 1 application topically 2 (two) times daily., Disp: 45 g, Rfl: 2 .  dicyclomine (BENTYL) 20 MG tablet, Take 1 tablet (20 mg total) by mouth every 6 (six) hours., Disp: 30 tablet, Rfl: 0 .  furosemide (LASIX) 40 MG tablet, Take 1 tablet (40 mg total) by mouth 2 (two) times daily. Take as directed, Disp: 30 tablet, Rfl: 0 .  ibuprofen (ADVIL,MOTRIN) 800 MG tablet, TAKE 1 TABLET BY MOUTH EVERY 8 HOURS AS NEEDED FOR HEADACHE OR MILD PAIN, Disp: 90 tablet, Rfl: 1 .  lamoTRIgine (LAMICTAL) 200 MG tablet, Take 1 tablet (200 mg total) by mouth daily., Disp: 30 tablet, Rfl: 1 .  medroxyPROGESTERone (PROVERA) 5 MG tablet, Take one tablet a day for 5 days every other month if no spontaneous cycle. Start on November, 1,2019., Disp: 15 tablet, Rfl: 1 .  metFORMIN (GLUCOPHAGE XR) 750 MG 24 hr tablet, Two tablets at bedtime., Disp: 120 tablet, Rfl: 1 .  potassium chloride SA (KLOR-CON M20) 20 MEQ tablet, Take 1 tablet (20 mEq total) by mouth daily., Disp: 180 tablet, Rfl: 3 .  Psyllium (NAT-RUL PSYLLIUM SEED HUSKS) 500 MG CAPS, 1-2 po qid as needed, Disp: 240 each, Rfl: 2 .  sacubitril-valsartan (ENTRESTO) 97-103 MG, Take 1 tablet by mouth 2 (two) times daily., Disp: 180 tablet, Rfl: 1 .  sertraline (ZOLOFT) 100 MG tablet, Take two tablets PO QDAY, Disp: 60 tablet, Rfl: 1 .  simvastatin (ZOCOR) 20 MG tablet, TAKE 1 TABLET BY MOUTH EVERY DAY, Disp: 90 tablet, Rfl: 2 .  spironolactone (ALDACTONE) 25 MG tablet, Take 1 tablet (25 mg total) by mouth daily., Disp: 90 tablet, Rfl: 3   Allergies  Allergen Reactions  . Fluoxetine Other (See Comments)    More depressed  . Cymbalta [Duloxetine Hcl] Other (See Comments)    depressed  . Penicillins     REACTION: rash Has patient had a PCN reaction causing immediate rash, facial/tongue/throat  swelling, SOB or lightheadedness with hypotension: YES Has patient had a PCN reaction causing severe rash involving mucus membranes or skin necrosis: NO Has patient had a PCN reaction that required hospitalization: YES Has patient had a PCN reaction occurring within the last 10 years: NO If all of the above answers are "NO", then may proceed with Cephalosporin use.    Review of Systems   Pertinent items are noted in the HPI. Otherwise, a complete ROS is negative.  Vitals:   Vitals:   02/06/18 0916  BP: 124/74  Pulse: 77  Temp: 98.6 F (37 C)  TempSrc: Oral  SpO2: 95%  Weight: (!) 321 lb 6.4 oz (145.8 kg)  Height: 5' 3"  (1.6  m)     Body mass index is 56.93 kg/m.  Physical Exam:   Physical Exam Vitals signs and nursing note reviewed.  HENT:     Head: Normocephalic and atraumatic.  Eyes:     Pupils: Pupils are equal, round, and reactive to light.  Neck:     Musculoskeletal: Normal range of motion and neck supple.  Cardiovascular:     Rate and Rhythm: Normal rate and regular rhythm.     Heart sounds: Murmur present. Systolic murmur present with a grade of 3/6.  Pulmonary:     Effort: Pulmonary effort is normal.  Abdominal:     Palpations: Abdomen is soft.  Skin:    General: Skin is warm.     Capillary Refill: Capillary refill takes less than 2 seconds.  Psychiatric:        Speech: Speech is tangential.        Behavior: Behavior normal.    Diabetic Foot Exam - Simple   Simple Foot Form Diabetic Foot exam was performed with the following findings:  Yes 02/06/2018  9:26 AM  Visual Inspection No deformities, no ulcerations, no other skin breakdown bilaterally:  Yes Sensation Testing Intact to touch and monofilament testing bilaterally:  Yes Pulse Check Posterior Tibialis and Dorsalis pulse intact bilaterally:  Yes Comments     Assessment and Plan:   Hypertension associated with diabetes ([place]) Followed by Cardiology and doing well with Cardiac Rehab. She is  able to walk for much longer and with less dyspnea. [name] Loll has been very helpful. Norvasc was increased at most recent visit with Cardiology.  BP Readings from Last 3 Encounters:  02/06/18 124/74  01/29/18 112/70  12/02/17 136/76   Lab Results  Component Value Date   CREATININE 0.71 02/06/2018   Wt Readings from Last 3 Encounters:  02/06/18 (!) 321 lb 6.4 oz (145.8 kg)  01/29/18 (!) 325 lb (147.4 kg)  12/02/17 (!) 328 lb 6.4 oz (149 kg)    NICM (nonischemic cardiomyopathy) ([place]) 01/16/18 ECHO:   - Procedure narrative: Transthoracic echocardiography. Image   quality was suboptimal. The study was technically difficult.   Intravenous contrast (Definity) was administered. - Left ventricle: The cavity size was moderately dilated. Wall   thickness was increased in a pattern of mild LVH. Systolic   function was moderately to severely reduced. The estimated   ejection fraction was in the range of 30% to 35%. Diffuse   hypokinesis. Doppler parameters are consistent with abnormal left   ventricular relaxation (grade 1 diastolic dysfunction). - Mitral valve: Calcified annulus. - Atrial septum: There was an atrial septal aneurysm.  Hyperlipidemia associated with type 2 diabetes mellitus ([place]), on Zocor History: Is the patient taking medications without problems? Yes. Does the patient complain of muscle aches?  No. Trying to exercise on a regular basis? Yes. Compliant with diet? Yes.  Lab Results  Component Value Date   CHOL 204 (H) 07/18/2016   HDL 51.10 07/18/2016   LDLCALC 128 (H) 07/18/2016   LDLDIRECT 143.8 04/03/2011   TRIG 123.0 07/18/2016   CHOLHDL 4 07/18/2016   Lab Results  Component Value Date   ALT 22 02/06/2018   AST 25 02/06/2018   ALKPHOS 41 02/06/2018   BILITOT 0.7 02/06/2018     Assessment/Plan: Dyslipidemia under good control. Continue dietary measures. Continue regular exercise, an average 40 minutes of moderate to vigorous-intensity aerobic activity 3 or  4 times per week. Lipid-lowering medications: continue Zocor.  Morbid obesity with BMI of 50.0-59.9, adult ([place])  History: Working on weight loss through rehab and following a diet that has helped her in the past. We discussed bariatric surgery at the last visit, but she is not interested. Compliant with Metformin. Will consider GLP1-RA in the future.   Wt Readings from Last 3 Encounters:  02/06/18 (!) 321 lb 6.4 oz (145.8 kg)  01/29/18 (!) 325 lb (147.4 kg)  12/02/17 (!) 328 lb 6.4 oz (149 kg)   Assessment/Plan: The patient is asked to make an attempt to improve diet and exercise patterns to aid in medical management of this problem. Recheck at next visit.   Type 2 diabetes mellitus with hyperglycemia (HCC) History: Medication compliance: compliant all of the time, diabetic diet compliance: compliant most of the time, home glucose monitoring: is not performed, further diabetic ROS: no polyuria or polydipsia, no unusual visual symptoms, no hypoglycemia, no medication side effects noted, weight has decreased.  Lab Results  Component Value Date   HGBA1C 6.4 02/06/2018   Assessment:        Diabetes Mellitus: stable. Plan: 1.  Patient is counseled on appropriate foot care. 2.  BP goal < 130/80. 3.  LDL goal of < 100, HDL > 40 and TG < 150.  4.  Eye Exam yearly and Dental Exam every 6 months. 5.  Dietary recommendations: < 100 g carbohydrates daily. 6.  Physical Activity recommendations: as tolerated. 7.  Pneumovax at diagnosis and once 65+. Wait five years between first dose and dose after 65.  8.  Influenza annually.  9.  Will offer Trulicity or Ozempic.  Major depressive disorder Followed by Behavioral Health. Has been severe, with 2 hospitalizations in past. Husband supportive now that he is at home, was living in [place] for 15 years, visiting when he could. Son at [employer], on scholarship, he has Asperger's and requires a good deal of support and management. She has a few very  supportive friends, and used to keep in touch with her family in [facility]) until her mother died and no one called her to give condolences.  Orders Placed This Encounter  Procedures  . Comp Met (CMET)  . Hemoglobin A1c   No orders of the defined types were placed in this encounter.   . Reviewed expectations re: course of current medical issues. . Discussed self-management of symptoms. . Outlined signs and symptoms indicating need for more acute intervention. . Patient verbalized understanding and all questions were answered. [name] Health Maintenance issues including appropriate healthy diet, exercise, and smoking avoidance were discussed with patient. . See orders for this visit as documented in the electronic medical record. . Patient received an After Visit Summary.  Briscoe Deutscher, DO [place], [place] 02/08/2018  CMA served as Education administrator during this visit. History, Physical, and Plan performed by medical provider. The above documentation has been reviewed and is accurate and complete. Briscoe Deutscher, D.O.

## 2018-02-08 ENCOUNTER — Encounter: Payer: Self-pay | Admitting: Family Medicine

## 2018-02-08 NOTE — Assessment & Plan Note (Signed)
History: Working on weight loss through rehab and following a diet that has helped her in the past. We discussed bariatric surgery at the last visit, but she is not interested. Compliant with Metformin. Will consider GLP1-RA in the future.   Wt Readings from Last 3 Encounters:  02/06/18 (!) 321 lb 6.4 oz (145.8 kg)  01/29/18 (!) 325 lb (147.4 kg)  12/02/17 (!) 328 lb 6.4 oz (149 kg)   Assessment/Plan: The patient is asked to make an attempt to improve diet and exercise patterns to aid in medical management of this problem. Recheck at next visit.

## 2018-02-08 NOTE — Assessment & Plan Note (Signed)
History: Medication compliance: compliant all of the time, diabetic diet compliance: compliant most of the time, home glucose monitoring: is not performed, further diabetic ROS: no polyuria or polydipsia, no unusual visual symptoms, no hypoglycemia, no medication side effects noted, weight has decreased.  Lab Results  Component Value Date   HGBA1C 6.4 02/06/2018   Assessment:        Diabetes Mellitus: stable. Plan: 1.  Patient is counseled on appropriate foot care. 2.  BP goal < 130/80. 3.  LDL goal of < 100, HDL > 40 and TG < 150.  4.  Eye Exam yearly and Dental Exam every 6 months. 5.  Dietary recommendations: < 100 g carbohydrates daily. 6.  Physical Activity recommendations: as tolerated. 7.  Pneumovax at diagnosis and once 65+. Wait five years between first dose and dose after 65.  8.  Influenza annually.  9.  Will offer Trulicity or Ozempic.

## 2018-02-08 NOTE — Assessment & Plan Note (Signed)
01/16/18 ECHO:   - Procedure narrative: Transthoracic echocardiography. Image   quality was suboptimal. The study was technically difficult.   Intravenous contrast (Definity) was administered. - Left ventricle: The cavity size was moderately dilated. Wall   thickness was increased in a pattern of mild LVH. Systolic   function was moderately to severely reduced. The estimated   ejection fraction was in the range of 30% to 35%. Diffuse   hypokinesis. Doppler parameters are consistent with abnormal left   ventricular relaxation (grade 1 diastolic dysfunction). - Mitral valve: Calcified annulus. - Atrial septum: There was an atrial septal aneurysm.

## 2018-02-08 NOTE — Assessment & Plan Note (Signed)
Followed by [facility]. Has been severe, with 2 hospitalizations in past. Husband supportive now that he is at home, was living in [place] for 15 years, visiting when he could. Son at [employer], on scholarship, he has Asperger's and requires a good deal of support and management. She has a few very supportive friends, and used to keep in touch with her family in [facility]) until her mother died and no one called her to give condolences.

## 2018-02-08 NOTE — Assessment & Plan Note (Signed)
Followed by Cardiology and doing well with Cardiac Rehab. She is able to walk for much longer and with less dyspnea. [name] has been very helpful. Norvasc was increased at most recent visit with Cardiology.  BP Readings from Last 3 Encounters:  02/06/18 124/74  01/29/18 112/70  12/02/17 136/76   Lab Results  Component Value Date   CREATININE 0.71 02/06/2018   Wt Readings from Last 3 Encounters:  02/06/18 (!) 321 lb 6.4 oz (145.8 kg)  01/29/18 (!) 325 lb (147.4 kg)  12/02/17 (!) 328 lb 6.4 oz (149 kg)

## 2018-02-08 NOTE — Assessment & Plan Note (Signed)
History: Is the patient taking medications without problems? Yes. Does the patient complain of muscle aches?  No. Trying to exercise on a regular basis? Yes. Compliant with diet? Yes.  Lab Results  Component Value Date   CHOL 204 (H) 07/18/2016   HDL 51.10 07/18/2016   LDLCALC 128 (H) 07/18/2016   LDLDIRECT 143.8 04/03/2011   TRIG 123.0 07/18/2016   CHOLHDL 4 07/18/2016   Lab Results  Component Value Date   ALT 22 02/06/2018   AST 25 02/06/2018   ALKPHOS 41 02/06/2018   BILITOT 0.7 02/06/2018     Assessment/Plan: Dyslipidemia under good control. Continue dietary measures. Continue regular exercise, an average 40 minutes of moderate to vigorous-intensity aerobic activity 3 or 4 times per week. Lipid-lowering medications: continue Zocor.

## 2018-02-09 DIAGNOSIS — I5022 Chronic systolic (congestive) heart failure: Secondary | ICD-10-CM | POA: Diagnosis not present

## 2018-02-10 ENCOUNTER — Ambulatory Visit (HOSPITAL_COMMUNITY): Payer: Medicare Other | Admitting: Psychiatry

## 2018-02-11 DIAGNOSIS — I5022 Chronic systolic (congestive) heart failure: Secondary | ICD-10-CM | POA: Diagnosis not present

## 2018-02-12 DIAGNOSIS — I5022 Chronic systolic (congestive) heart failure: Secondary | ICD-10-CM | POA: Diagnosis not present

## 2018-02-16 DIAGNOSIS — I5022 Chronic systolic (congestive) heart failure: Secondary | ICD-10-CM | POA: Diagnosis not present

## 2018-02-17 NOTE — Addendum Note (Signed)
Addended by: Gwenyth Ober R on: 02/17/2018 11:02 AM   Modules accepted: Orders

## 2018-02-17 NOTE — Addendum Note (Signed)
Addended by: Gwenyth Ober R on: 02/17/2018 12:35 PM   Modules accepted: Orders

## 2018-03-02 ENCOUNTER — Ambulatory Visit (HOSPITAL_COMMUNITY): Payer: Medicare Other | Admitting: Psychiatry

## 2018-03-03 ENCOUNTER — Telehealth (HOSPITAL_COMMUNITY): Payer: Self-pay | Admitting: *Deleted

## 2018-03-03 ENCOUNTER — Ambulatory Visit (HOSPITAL_COMMUNITY): Payer: Self-pay | Admitting: Psychiatry

## 2018-03-03 NOTE — Telephone Encounter (Signed)
Received referral from Gertie Exon, [place] cardiac re[facility].  Referral for pulmonary rehab inadvertently sent to [place].  Dianne routed this referral to [facility], pt resides in [place].  Medical review of pt chart reveals, pt finished cardiac rehab phase II at [facility] on 12/23 with the diagnosis of systolic heart failure.  Called pt to inquire preference of location.  Message left.  Called Dr. Juleen China office and spoke to [facility] referral coordinator.  Informed that pt would not be eligible to participate in cardiac rehab again since finishing in December.  Pt does not have any other supporting diagnosis that is medicare reimbursable.  Pt can participate in pulmonary rehab (non telemetry) and would need a medically supported diagnosis.  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 PFT within 5 years.  Also medicaid does not reimburse for pulmonary rehab and pt would be responsible for 20%.  Message will be relayed to nurse, await a return call. Cherre Huger, BSN Cardiac and Training and development officer

## 2018-03-04 ENCOUNTER — Other Ambulatory Visit: Payer: Self-pay | Admitting: Family Medicine

## 2018-03-04 DIAGNOSIS — E1165 Type 2 diabetes mellitus with hyperglycemia: Secondary | ICD-10-CM

## 2018-03-08 ENCOUNTER — Other Ambulatory Visit: Payer: Self-pay | Admitting: Cardiology

## 2018-03-09 ENCOUNTER — Encounter (INDEPENDENT_AMBULATORY_CARE_PROVIDER_SITE_OTHER): Payer: Self-pay

## 2018-03-09 ENCOUNTER — Encounter: Payer: Self-pay | Admitting: Cardiovascular Disease

## 2018-03-09 ENCOUNTER — Ambulatory Visit (INDEPENDENT_AMBULATORY_CARE_PROVIDER_SITE_OTHER): Payer: Medicare Other | Admitting: Cardiovascular Disease

## 2018-03-09 VITALS — BP 114/74 | HR 77 | Ht 63.0 in | Wt 323.0 lb

## 2018-03-09 DIAGNOSIS — G4733 Obstructive sleep apnea (adult) (pediatric): Secondary | ICD-10-CM

## 2018-03-09 DIAGNOSIS — I5022 Chronic systolic (congestive) heart failure: Secondary | ICD-10-CM

## 2018-03-09 DIAGNOSIS — F909 Attention-deficit hyperactivity disorder, unspecified type: Secondary | ICD-10-CM | POA: Diagnosis not present

## 2018-03-09 DIAGNOSIS — Z8659 Personal history of other mental and behavioral disorders: Secondary | ICD-10-CM

## 2018-03-09 DIAGNOSIS — I10 Essential (primary) hypertension: Secondary | ICD-10-CM | POA: Diagnosis not present

## 2018-03-09 DIAGNOSIS — I428 Other cardiomyopathies: Secondary | ICD-10-CM

## 2018-03-09 NOTE — Patient Instructions (Signed)
Medication Instructions:  The current medical regimen is effective;  continue present plan and medications.  If you need a refill on your cardiac medications before your next appointment, please call your pharmacy.    Follow-Up: At [facility], you and your health needs are our priority.  As part of our continuing mission to provide you with exceptional heart care, we have created designated Provider Care Teams.  These Care Teams include your primary Cardiologist (physician) and Advanced Practice Providers (APPs -  Physician Assistants and Nurse Practitioners) who all work together to provide you with the care you need, when you need it. You will need a follow up appointment in 3 months.   You may see Dr.Kelly or one of the following Advanced Practice Providers on your designated Care Team: Almyra Deforest, [place] . Fabian Sharp, PA-C  Any Other Special Instructions Will Be Listed Below (If Applicable). Go see choice about the machine if they can give a replacement while they fix yours.

## 2018-03-09 NOTE — Progress Notes (Signed)
Patient ID: [name], female   DOB: [birth year], 53 y.o.   MRN: [id number]     HPI: [name] is a 53 y.o. female who initially seen by me in 2016 for initial sleep clinic evaluation after initiation of CPAP therapy for obstructive sleep apnea.  I have not seen her since.  She presents to the office today for follow-up sleep evaluation with complaint that her machine is beginning to malfunction.  [name] is followed by Dr. Percival Spanish for primary cardiology care.  She has a history of coronary which initiated postpartum.  She is originally from [place], [place].  She has a history of obesity, palpitations and peripheral edema.  She had undergone a sleep study on April 22, 2014 and had complaints of she significant snoring and daytime fatigue.  She met split-night criteria.  She was found to have severe obstructive sleep apnea with an AHI of 33.1/h and significant oxygen desaturation with non-REM sleep to a nadir of 69%.  She was unable to achieve REM sleep  on the diagnostic evaluation.  With CPAP therapy, the lowest oxygen desaturation was 72% at 5 cm.  She ultimately was titrated up to 11 cm water pressure and during her study was started on 1 L of supplemental oxygen.  She subsequently initiated CPAP therapy with a ResMed air since 10 AutoSet unit set at this 11 cm pressure.  A download from Jul 22, 2014 through 08/20/2014 revealed Medicare compliance with usage states at 100% and days with greater than for his abuse at 80%.  She is averaging 6 hours and 49 minutes of use.  AHI was markedly improved in 3.3, with an apnea index of 1.6 and hyponea index of 1.7.  She was going to bed between 10 and 10:30 and waking up at 5 AM.  She uses nasal pillow mask.  She did not have any significant leak is noted on her download.  She was unaware of breakthrough snoring, residual daytime sleepiness, bruxism or restless legs.  An Epworth Sleepiness Scale score endorsed at 9.  Over the past several years, she is  been followed by Dr. Percival Spanish for cardiomyopathy.  An echo Doppler study November 2019 showed an EF of 30 to 35% with mild LVH and grade 1 diastolic dysfunction.  Last seen by Dr. Percival Spanish in December 2019 follow-up sleep evaluation was advised.  Patient states recently her machine has begun to malfunction.  Electrically it tends to intermittently shut off.  She admits to excellent compliance prior to the machine malfunction.  In the office I obtained a download from August 26, 2017 through January 25, 2018.  Usage days was 82% and she was averaging 5 hours and 33 minutes.  She has a ResMed air sense 10 AutoSet unit with a minimum pressure at 10 and maximum pressure at 20.  AHI was 3.4.  Her 95th percentile pressure was elevated at 17 with an average maximum pressure of 18.1.  Over the past month, her machine has not functioned consistently.  This has led to reduction in compliance.  She presents for reevaluation.  Her DME company is [facility] [employer].  She had asked if I could recommend pulmonary rehab.  She has been participating in [employer].   Past Medical History:  Diagnosis Date  . ADD (attention deficit disorder)   . Anxiety   . Arthritis    both knees right worse than left  . Cardiomyopathy, peripartum, postpartum   . Carpal tunnel syndrome of right wrist   .  CHF (congestive heart failure) ([place])   . Chronic systolic CHF (congestive heart failure), NYHA class 3 ([place]) 12/06/2013  . Depression   . Dyspnea   . Essential hypertension, benign   . Gallstones   . History of smoking 06/22/2016  . Hyperlipidemia associated with type 2 diabetes mellitus ([place]), on Zocor 04/04/2011  . Hypertension associated with diabetes ([place]) 06/03/2008  . Migraines   . Morbid obesity ([place])   . Morbid obesity with BMI of 50.0-59.9, adult ([place]) 07/12/2006  . NICM (nonischemic cardiomyopathy) ([place]) 07/12/2006   01/16/18 ECHO:   - Procedure narrative: Transthoracic echocardiography. Image   quality was suboptimal.  The study was technically difficult.   Intravenous contrast (Definity) was administered. - Left ventricle: The cavity size was moderately dilated. Wall   thickness was increased in a pattern of mild LVH. Systolic   function was moderately to severely reduced. The estimated   ejection fraction w  . Normal coronary arteries 04/17/2016  . OSA (obstructive sleep apnea)   . OSA on CPAP 10/28/2014  . Plantar fasciitis   . Spinal stenosis    back pain  . Spondylosis without myelopathy or radiculopathy, lumbar region 12/04/2017  . Type 2 diabetes mellitus with hyperglycemia [facility])     Past Surgical History:  Procedure Laterality Date  . CHOLECYSTECTOMY    . DILATATION & CURETTAGE/HYSTEROSCOPY WITH MYOSURE N/A 10/31/2017   Procedure: DILATATION & CURETTAGE/HYSTEROSCOPY WITH MYOSURE;  Surgeon: Salvadore Dom, MD;  Location: [facility];  Service: Gynecology;  Laterality: N/A;  . RIGHT/LEFT HEART CATH AND CORONARY ANGIOGRAPHY N/A 04/11/2016   Procedure: Right/Left Heart Cath and Coronary Angiography;  Surgeon: Burnell Blanks, MD;  Location: [facility];  Service: Cardiovascular;  Laterality: N/A;  . sonogram for blood clots     no blockages    Allergies  Allergen Reactions  . Fluoxetine Other (See Comments)    More depressed  . Cymbalta [Duloxetine Hcl] Other (See Comments)    depressed  . Penicillins     REACTION: rash Has patient had a PCN reaction causing immediate rash, facial/tongue/throat swelling, SOB or lightheadedness with hypotension: YES Has patient had a PCN reaction causing severe rash involving mucus membranes or skin necrosis: NO Has patient had a PCN reaction that required hospitalization: YES Has patient had a PCN reaction occurring within the last 10 years: NO If all of the above answers are "NO", then may proceed with Cephalosporin use.     Current Outpatient Medications  Medication Sig Dispense Refill  . amLODipine (NORVASC) 5 MG tablet Take 1 tablet by mouth  daily.     . busPIRone (BUSPAR) 10 MG tablet Take 1 tablet (10 mg total) by mouth 2 (two) times daily. 60 tablet 1  . carvedilol (COREG) 12.5 MG tablet Take 1 tablet (12.5 mg total) by mouth 2 (two) times daily with a meal. 180 tablet 1  . carvedilol (COREG) 25 MG tablet Take 1 tablet (25 mg total) by mouth 2 (two) times daily with a meal. 180 tablet 1  . clonazePAM (KLONOPIN) 1 MG tablet Take .5 mg qd prn anxiety 21 tablet 1  . clotrimazole-betamethasone (LOTRISONE) cream Apply 1 application topically 2 (two) times daily. 45 g 2  . dicyclomine (BENTYL) 20 MG tablet Take 1 tablet (20 mg total) by mouth every 6 (six) hours. 30 tablet 0  . furosemide (LASIX) 40 MG tablet Take 1 tablet (40 mg total) by mouth 2 (two) times daily. Take as directed 30 tablet 0  .  ibuprofen (ADVIL,MOTRIN) 800 MG tablet TAKE 1 TABLET BY MOUTH EVERY 8 HOURS AS NEEDED FOR HEADACHE OR MILD PAIN 90 tablet 1  . lamoTRIgine (LAMICTAL) 200 MG tablet Take 1 tablet (200 mg total) by mouth daily. 30 tablet 1  . medroxyPROGESTERone (PROVERA) 5 MG tablet Take one tablet a day for 5 days every other month if no spontaneous cycle. Start on November, 1,2019. 15 tablet 1  . metFORMIN (GLUCOPHAGE-XR) 750 MG 24 hr tablet TAKE 2 TABLETS BY MOUTH AT BEDTIME 120 tablet 1  . potassium chloride SA (KLOR-CON M20) 20 MEQ tablet Take 1 tablet (20 mEq total) by mouth daily. 180 tablet 3  . Psyllium (NAT-RUL PSYLLIUM SEED HUSKS) 500 MG CAPS 1-2 po qid as needed 240 each 2  . sacubitril-valsartan (ENTRESTO) 97-103 MG Take 1 tablet by mouth 2 (two) times daily. 180 tablet 1  . sertraline (ZOLOFT) 100 MG tablet Take two tablets PO QDAY 60 tablet 1  . simvastatin (ZOCOR) 20 MG tablet TAKE 1 TABLET BY MOUTH EVERY DAY 90 tablet 2  . spironolactone (ALDACTONE) 25 MG tablet Take 1 tablet (25 mg total) by mouth daily. 90 tablet 3   No current facility-administered medications for this visit.     Social History   Socioeconomic History  . Marital status:  Married    Spouse name: Not on file  . Number of children: Not on file  . Years of education: Not on file  . Highest education level: Not on file  Occupational History  . Not on file  Social Needs  . Financial resource strain: Not on file  . Food insecurity:    Worry: Not on file    Inability: Not on file  . Transportation needs:    Medical: Not on file    Non-medical: Not on file  Tobacco Use  . Smoking status: Former Smoker    Packs/day: 0.50    Types: Cigarettes    Last attempt to quit: 02/25/1997    Years since quitting: 21.0  . Smokeless tobacco: Never Used  . Tobacco comment: Married, lives with spouse (when he is not traveling) and son  Substance and Sexual Activity  . Alcohol use: No  . Drug use: No  . Sexual activity: Yes    Partners: Male    Birth control/protection: None  Lifestyle  . Physical activity:    Days per week: Not on file    Minutes per session: Not on file  . Stress: Not on file  Relationships  . Social connections:    Talks on phone: Not on file    Gets together: Not on file    Attends religious service: Not on file    Active member of club or organization: Not on file    Attends meetings of clubs or organizations: Not on file    Relationship status: Not on file  . Intimate partner violence:    Fear of current or ex partner: Not on file    Emotionally abused: Not on file    Physically abused: Not on file    Forced sexual activity: Not on file  Other Topics Concern  . Not on file  Social History Narrative  . Not on file   Additional social history is notable that she was pointing [place], [place].  Her husband is [place].  He is working back in [place].  She is here in [place] with her child.  Family History  Problem Relation Age of Onset  . Depression  Mother   . Anxiety disorder Mother   . Diabetes Mother   . Hypertension Mother   . ADD / ADHD Father   . Diabetes Father   . Hypertension Father   . Hypertension Other   .  Diabetes Other   . Colitis Other   . Alcohol abuse Other      ROS General: Negative; No fevers, chills, or night sweats HEENT: Negative; No changes in vision or hearing, sinus congestion, difficulty swallowing Pulmonary: Negative; No cough, wheezing, shortness of breath, hemoptysis Cardiovascular: Positive for postpartum cardiomyopathy GI: Negative; No nausea, vomiting, diarrhea, or abdominal pain GU: Negative; No dysuria, hematuria, or difficulty voiding Musculoskeletal: Negative; no myalgias, joint pain, or weakness Hematologic: Negative; no easy bruising, bleeding Endocrine: Negative; no heat/cold intolerance Neuro: Negative; no changes in balance, headaches Skin: Negative; No rashes or skin lesions Psychiatric: Positive for depression, positive for ADHD Sleep: See history of present illness No daytime sleepiness, hypersomnolence, bruxism, restless legs, hypnogognic hallucinations, no cataplexy   Physical Exam BP 114/74   Pulse 77   Ht _0  (1.6 m)   Wt (!) 323 lb (146.5 kg)   BMI 57.22 kg/m   Wt Readings from Last 3 Encounters:  03/09/18 (!) 323 lb (146.5 kg)  02/06/18 (!) 321 lb 6.4 oz (145.8 kg)  01/29/18 (!) 325 lb (147.4 kg)   General: Alert, oriented, no distress.  Super morbid obesity. Skin: normal turgor, no rashes, warm and dry HEENT: Normocephalic, atraumatic. Pupils equal round and reactive to light; sclera anicteric; extraocular muscles intact;  Nose without nasal septal hypertrophy Mouth/Parynx benign; Mallinpatti scale 3/4 Neck:Thick neck;  No JVD, no carotid bruits; normal carotid upstroke Lungs: clear to ausculatation and percussion; no wheezing or rales Chest wall: without tenderness to palpitation Heart: PMI not displaced, RRR, s1 s2 normal, 1/6 systolic murmur, no diastolic murmur, no rubs, gallops, thrills, or heaves Abdomen: Significant abdominal panniculus; soft, nontender; no hepatosplenomehaly, BS+; abdominal aorta nontender and not dilated by  palpation. Back: no CVA tenderness Pulses 2+ Musculoskeletal: full range of motion, normal strength, no joint deformities Extremities: no clubbing cyanosis or edema, Homan's sign negative  Neurologic: grossly nonfocal; Cranial nerves grossly wnl Psychologic: Normal mood and affect   ECG (independently read by me): Normal sinus rhythm at 77 bpm.  Left axis deviation.  LVH by voltage criteria in aVL.  LABS:  BMP Latest Ref Rng & Units 02/06/2018 10/30/2017 10/29/2017  Glucose 70 - 99 mg/dL 137(H) 168(H) 139(H)  BUN 6 - 23 mg/dL _1 Creatinine 0.40 - 1.20 mg/dL 0.71 0.82 0.78  BUN/Creat Ratio 9 - 23 - 20 -  Sodium 135 - 145 mEq/L 139 140 137  Potassium 3.5 - 5.1 mEq/L 3.9 4.3 4.2  Chloride 96 - 112 mEq/L 101 100 103  CO2 19 - 32 mEq/L _2 Calcium 8.4 - 10.5 mg/dL 8.8 9.2 8.9     Hepatic Function Latest Ref Rng & Units 02/06/2018 07/14/2017 03/11/2017  Total Protein 6.0 - 8.3 g/dL 7.0 7.6 7.3  Albumin 3.5 - 5.2 g/dL 4.1 3.8 3.8  AST 0 - 37 U/L _3 ALT 0 - 35 U/L _4 Alk Phosphatase 39 - 117 U/L 41 33(L) 41  Total Bilirubin 0.2 - 1.2 mg/dL 0.7 0.7 0.5  Bilirubin, Direct 0.0 - 0.3 mg/dL - - -     CBC Latest Ref Rng & Units 10/29/2017 07/14/2017 10/19/2016  WBC 4.0 - 10.5 K/uL 8.8 10.5 11.8(H)  Hemoglobin 12.0 - 15.0 g/dL 14.4 14.0 13.0  Hematocrit 36.0 - 46.0 % 44.4 42.3 40.5  Platelets 150 - 400 K/uL 217 275.0 252     Lipid Panel     Component Value Date/Time   CHOL 204 (H) 07/18/2016 1125   TRIG 123.0 07/18/2016 1125   HDL 51.10 07/18/2016 1125   CHOLHDL 4 07/18/2016 1125   VLDL 24.6 07/18/2016 1125   LDLCALC 128 (H) 07/18/2016 1125   LDLDIRECT 143.8 04/03/2011 0929     RADIOLOGY: No results found.   IMPRESSION:  1. OSA (obstructive sleep apnea)   2. Chronic systolic heart failure ([place])   3. Essential hypertension   4. Morbid obesity ([place])   5. NICM (nonischemic cardiomyopathy) ([place])   6. History of depression   7. Attention deficit  hyperactivity disorder (ADHD), unspecified ADHD type     ASSESSMENT AND PLAN: [name] is a 67 female who is a history of a postpartum cardiomyopathy, palpitations, edema.  She recently was found to have severe obstructive sleep apnea with an AHI of 33.1/h.  On the diagnostic portion of her CPAP study, she was unable to achieve REM  sleep.  He has been documented to have developed a cardiomyopathy with most recent echo cardiographic assessment of EF at 30 to 35%.  I reviewed her recent download and retrospectively went back for the past 6 months for compliance.  Prior to recent machine malfunction she has been consistently compliant.  Her most recent AHI is 3.8.  Since her 95th percentile pressure is elevated I am changing her CPAP a minimum pressure to 12 cm water pressure to a maximum of 20.  I have recommended that she [facility] so that they can inspect to see if her machine is fixable.  Her machine is not yet 52 years old but if it is malfunctioning she may qualify for a new machine.  She states that recently she has been depressed since her mother died last 2022-07-19.  She has a history of attention deficit disorder and has been off Calderol since she was found to have reduced LV function.  Typically she is going to bed at 1130 and wakes up at 5 AM.  She continues to participate in [facility] cardiac health strides.  She would like also as result of her sleep apnea to be a participant in the [facility] pulmonary rehabilitation program.  She provided [place] with a contacts to see if this can be arranged.  We discussed the importance of weight loss and she has lost some weight and she was commended for this. I will see her in 3 months for reevaluation or sooner if problems arise.  Will return to the primary cardiology care of Dr. Percival Spanish.  Time spent: 30 minutes Troy Sine, MD, [facility]  03/10/2018 4:07 PM

## 2018-03-10 ENCOUNTER — Other Ambulatory Visit: Payer: Self-pay | Admitting: Cardiology

## 2018-03-10 ENCOUNTER — Telehealth (HOSPITAL_COMMUNITY): Payer: Self-pay | Admitting: *Deleted

## 2018-03-10 ENCOUNTER — Encounter: Payer: Self-pay | Admitting: Cardiovascular Disease

## 2018-03-10 NOTE — Telephone Encounter (Signed)
Spoke to [place] at Dr. Juleen China office for update on pt preference for pulmonary rehab.  Pt would like to go to [facility] since she just completed cardiac rehab in December at [facility].  Will close this referral. Dr. Juleen China office to contact [facility] regional for their referral process. Cherre Huger, BSN Cardiac and Training and development officer

## 2018-04-04 ENCOUNTER — Other Ambulatory Visit: Payer: Self-pay | Admitting: Family Medicine

## 2018-04-04 DIAGNOSIS — G8929 Other chronic pain: Secondary | ICD-10-CM

## 2018-04-04 DIAGNOSIS — M545 Low back pain: Principal | ICD-10-CM

## 2018-04-08 ENCOUNTER — Ambulatory Visit: Payer: Medicare Other

## 2018-04-21 ENCOUNTER — Other Ambulatory Visit: Payer: Self-pay | Admitting: Obstetrics and Gynecology

## 2018-04-21 NOTE — Telephone Encounter (Signed)
Medication refill request: Provera  Last OV: 12/02/17   Next AEX: 06/03/18 Last MMG (if hormonal medication request): 10/20/17 Bi-rads 3 probably benign  Refill authorized: #[id number] with 1 RF

## 2018-04-27 ENCOUNTER — Other Ambulatory Visit: Payer: Self-pay

## 2018-05-04 ENCOUNTER — Other Ambulatory Visit: Payer: Self-pay | Admitting: Family Medicine

## 2018-05-04 DIAGNOSIS — E1165 Type 2 diabetes mellitus with hyperglycemia: Secondary | ICD-10-CM

## 2018-05-04 NOTE — Telephone Encounter (Signed)
What should she be on?

## 2018-05-04 NOTE — Telephone Encounter (Signed)
750 mg er to take two at night.

## 2018-05-08 ENCOUNTER — Ambulatory Visit: Payer: Medicare Other | Admitting: Family Medicine

## 2018-05-23 ENCOUNTER — Other Ambulatory Visit: Payer: Self-pay | Admitting: Family Medicine

## 2018-05-23 DIAGNOSIS — E1165 Type 2 diabetes mellitus with hyperglycemia: Secondary | ICD-10-CM

## 2018-05-25 ENCOUNTER — Other Ambulatory Visit: Payer: Self-pay

## 2018-05-25 DIAGNOSIS — I5022 Chronic systolic (congestive) heart failure: Secondary | ICD-10-CM

## 2018-05-25 MED ORDER — SACUBITRIL-VALSARTAN 97-103 MG PO TABS: 1.0000 | ORAL_TABLET | Freq: Two times a day (BID) | ORAL | 2 refills | Status: DC

## 2018-05-26 ENCOUNTER — Telehealth (HOSPITAL_COMMUNITY): Payer: Self-pay

## 2018-05-26 DIAGNOSIS — F331 Major depressive disorder, recurrent, moderate: Secondary | ICD-10-CM

## 2018-05-26 NOTE — Telephone Encounter (Signed)
Patient is asking for a refill on her Lamictal. Please review and advise, thank you

## 2018-05-28 MED ORDER — LAMOTRIGINE 200 MG PO TABS: 200.0000 mg | ORAL_TABLET | Freq: Every day | ORAL | 0 refills | Status: DC

## 2018-05-31 ENCOUNTER — Other Ambulatory Visit: Payer: Self-pay | Admitting: Family Medicine

## 2018-05-31 DIAGNOSIS — E1165 Type 2 diabetes mellitus with hyperglycemia: Secondary | ICD-10-CM

## 2018-06-01 NOTE — Telephone Encounter (Addendum)
Pharmacy requesting alternative to Metformin 750 because it is on back order. Pt requesting 90 day supply. Virtual visit scheduled for 06/03/18.

## 2018-06-01 NOTE — Telephone Encounter (Signed)
Last OV 02/06/2018 Next OV not scheduled  Pharmacy comment: Alternative Requested:METFORMIN ER 750MG  IS CURRENTLY ON BACKORDER, CAN YOU SWITCH TO SOMETHING ELSE?  Forwarding to Dr. Juleen China

## 2018-06-01 NOTE — Telephone Encounter (Signed)
Okay refill but set up virtual visit please.

## 2018-06-02 NOTE — Telephone Encounter (Signed)
Spoke with pt and advised regarding new rx directions. She had requested 90 day supply so she will not pick up rx until after her appointment with Dr. Juleen China tomorrow and will request new rx for 90 day supply then to see if appropriate.

## 2018-06-03 ENCOUNTER — Ambulatory Visit: Payer: Medicare Other | Admitting: Obstetrics and Gynecology

## 2018-06-03 ENCOUNTER — Encounter: Payer: Self-pay | Admitting: Family Medicine

## 2018-06-03 ENCOUNTER — Other Ambulatory Visit: Payer: Self-pay

## 2018-06-03 ENCOUNTER — Ambulatory Visit (INDEPENDENT_AMBULATORY_CARE_PROVIDER_SITE_OTHER): Payer: Medicare Other | Admitting: Family Medicine

## 2018-06-03 VITALS — Temp 97.6°F | Ht 63.0 in | Wt 323.0 lb

## 2018-06-03 DIAGNOSIS — Z9989 Dependence on other enabling machines and devices: Secondary | ICD-10-CM

## 2018-06-03 DIAGNOSIS — R7989 Other specified abnormal findings of blood chemistry: Secondary | ICD-10-CM | POA: Diagnosis not present

## 2018-06-03 DIAGNOSIS — R0602 Shortness of breath: Secondary | ICD-10-CM

## 2018-06-03 DIAGNOSIS — R635 Abnormal weight gain: Secondary | ICD-10-CM

## 2018-06-03 DIAGNOSIS — E1165 Type 2 diabetes mellitus with hyperglycemia: Secondary | ICD-10-CM | POA: Diagnosis not present

## 2018-06-03 DIAGNOSIS — Z6841 Body Mass Index (BMI) 40.0 and over, adult: Secondary | ICD-10-CM

## 2018-06-03 DIAGNOSIS — Z79899 Other long term (current) drug therapy: Secondary | ICD-10-CM

## 2018-06-03 DIAGNOSIS — I5022 Chronic systolic (congestive) heart failure: Secondary | ICD-10-CM | POA: Diagnosis not present

## 2018-06-03 DIAGNOSIS — G4733 Obstructive sleep apnea (adult) (pediatric): Secondary | ICD-10-CM | POA: Diagnosis not present

## 2018-06-03 MED ORDER — FUROSEMIDE 40 MG PO TABS: 40.0000 mg | ORAL_TABLET | Freq: Two times a day (BID) | ORAL | 2 refills | Status: DC

## 2018-06-03 NOTE — Progress Notes (Addendum)
Virtual Visit via Video   I connected with [name] by a video enabled telemedicine application and verified that I am speaking with the correct person using two identifiers. Location patient: Home Location provider: [facility], Office Persons participating in the virtual visit: [name], Samantha Deutscher, DO [name], CMA acting as scribe for Dr. Briscoe [name].   I discussed the limitations of evaluation and management by telemedicine and the availability of in person appointments. The patient expressed understanding and agreed to proceed.  Subjective:   EZM:OQHUTML has had increased anxiety due to covid. She does see Psychiatry. She has felt very overwhelmed. Her husband is stuck out of the country.   She does feel like she has had some increased in weight being inside more due to self quarantine. She was instructed that she and her son are ok to get outside just to avoid being around people. She has been staying in the house for 6 weeks.   Reviewed all precautions and expectations with prevention of Covid-19  Review of Systems  Constitutional: Positive for malaise/fatigue. Negative for chills, fever and weight loss.  Respiratory: Positive for shortness of breath. Negative for cough and wheezing.   Cardiovascular: Negative for chest pain, palpitations and leg swelling.  Gastrointestinal: Negative for abdominal pain, constipation, diarrhea, nausea and vomiting.  Genitourinary: Negative for dysuria and urgency.  Musculoskeletal: Positive for joint pain. Negative for myalgias.  Skin: Negative for rash.  Neurological: Negative for dizziness and headaches.  Psychiatric/Behavioral: Positive for depression. Negative for substance abuse and suicidal ideas. The patient is nervous/anxious.     Patient Active Problem List   Diagnosis Date Noted  . Elevated TSH 06/06/2018  . Spondylosis without myelopathy or radiculopathy, lumbar region 12/04/2017  . Type 2 diabetes mellitus  with hyperglycemia ([place])   . SOB (shortness of breath) 06/22/2016  . History of smoking 06/22/2016  . Normal coronary arteries 04/17/2016  . OSA on CPAP 10/28/2014  . Major depressive disorder 10/28/2014  . Chronic systolic CHF (congestive heart failure), NYHA class 3 ([name]) 12/06/2013  . Hyperlipidemia associated with type 2 diabetes mellitus ([place]), on Zocor 04/04/2011  . Hypertension associated with diabetes ([place]) 06/03/2008  . Morbid obesity with BMI of 50.0-59.9, adult ([name]) 07/12/2006  . NICM (nonischemic cardiomyopathy) ([name]) 07/12/2006  . Attention deficit disorder 07/12/2006    Social History   Tobacco Use  . Smoking status: Former Smoker    Packs/day: 0.50    Types: Cigarettes    Last attempt to quit: 02/25/1997    Years since quitting: 21.2  . Smokeless tobacco: Never Used  . Tobacco comment: Married, lives with spouse (when he is not traveling) and son  Substance Use Topics  . Alcohol use: No   Current Outpatient Medications:  .  amLODipine (NORVASC) 5 MG tablet, Take 1 tablet by mouth daily. , Disp: , Rfl:  .  busPIRone (BUSPAR) 10 MG tablet, Take 1 tablet (10 mg total) by mouth 2 (two) times daily., Disp: 60 tablet, Rfl: 1 .  carvedilol (COREG) 12.5 MG tablet, Take 1 tablet (12.5 mg total) by mouth 2 (two) times daily with a meal., Disp: 180 tablet, Rfl: 1 .  carvedilol (COREG) 25 MG tablet, Take 1 tablet (25 mg total) by mouth 2 (two) times daily with a meal., Disp: 180 tablet, Rfl: 1 .  clonazePAM (KLONOPIN) 1 MG tablet, Take .5 mg qd prn anxiety, Disp: 21 tablet, Rfl: 1 .  clotrimazole-betamethasone (LOTRISONE) cream, APPLY TO AFFECTED AREA TWICE A DAY,  Disp: 45 g, Rfl: 2 .  dicyclomine (BENTYL) 20 MG tablet, Take 1 tablet (20 mg total) by mouth every 6 (six) hours., Disp: 30 tablet, Rfl: 0 .  furosemide (LASIX) 40 MG tablet, Take 1 tablet (40 mg total) by mouth 2 (two) times daily. Take as directed, Disp: 30 tablet, Rfl: 0 .  ibuprofen (ADVIL,MOTRIN) 800 MG tablet,  TAKE 1 TABLET BY MOUTH EVERY 8 HOURS AS NEEDED FOR HEADACHE OR MILD PAIN, Disp: 90 tablet, Rfl: 1 .  lamoTRIgine (LAMICTAL) 200 MG tablet, Take 1 tablet (200 mg total) by mouth daily. Patient needs appt. For future refills, Disp: 30 tablet, Rfl: 0 .  medroxyPROGESTERone (PROVERA) 5 MG tablet, TAKE ONE TABLET A DAY FOR 5 DAYS EVERY OTHER MONTH IF NO SPONTANEOUS CYCLE. START ON December 26 2017, Disp: 15 tablet, Rfl: 1 .  metFORMIN (GLUCOPHAGE-XR) 500 MG 24 hr tablet, Take 3 tablets (1,500 mg total) by mouth at bedtime. Please specify directions, refills and quantity, Disp: 90 tablet, Rfl: 2 .  potassium chloride SA (KLOR-CON M20) 20 MEQ tablet, Take 1 tablet (20 mEq total) by mouth daily., Disp: 180 tablet, Rfl: 3 .  Psyllium (NAT-RUL PSYLLIUM SEED HUSKS) 500 MG CAPS, 1-2 po qid as needed, Disp: 240 each, Rfl: 2 .  sacubitril-valsartan (ENTRESTO) 97-103 MG, Take 1 tablet by mouth 2 (two) times daily., Disp: 180 tablet, Rfl: 2 .  sertraline (ZOLOFT) 100 MG tablet, Take two tablets PO QDAY, Disp: 60 tablet, Rfl: 1 .  simvastatin (ZOCOR) 20 MG tablet, TAKE 1 TABLET BY MOUTH EVERY DAY, Disp: 90 tablet, Rfl: 2 .  spironolactone (ALDACTONE) 25 MG tablet, Take 1 tablet (25 mg total) by mouth daily., Disp: 90 tablet, Rfl: 3  Allergies  Allergen Reactions  . Fluoxetine Other (See Comments)    More depressed  . Cymbalta [Duloxetine Hcl] Other (See Comments)    depressed  . Penicillins     REACTION: rash Has patient had a PCN reaction causing immediate rash, facial/tongue/throat swelling, SOB or lightheadedness with hypotension: YES Has patient had a PCN reaction causing severe rash involving mucus membranes or skin necrosis: NO Has patient had a PCN reaction that required hospitalization: YES Has patient had a PCN reaction occurring within the last 10 years: NO If all of the above answers are "NO", then may proceed with Cephalosporin use.    Objective:   VITALS: Per patient if applicable, see  vitals. GENERAL: Alert  and in no acute distress. CARDIOPULMONARY: No increased WOB. Speaking in clear sentences. PSYCH: Pleasant and cooperative. Speech normal rate and rhythm.  NEURO: Oriented as arrived to appointment on time with no prompting.   Assessment and Plan:   [name] was seen today for follow-up.  Diagnoses and all orders for this visit:  Type 2 diabetes mellitus with hyperglycemia, without long-term current use of insulin (HCC) Comments: Stable. Continue current treatment.  Orders: -     Comprehensive metabolic panel; Future -     Hemoglobin A1c; Future -     Hemoglobin A1c -     Comprehensive metabolic panel  Chronic systolic CHF (congestive heart failure), NYHA class 3 (HCC) Comments: No edema per patient today.  Orders: -     furosemide (LASIX) 40 MG tablet; Take 1 tablet (40 mg total) by mouth 2 (two) times daily. Take as directed  OSA on CPAP Comments: Compliant now that CPAP is working and feels a little better.   SOB (shortness of breath) Comments: Stable.  Orders: -  CBC with Differential/Platelet; Future -     Comprehensive metabolic panel; Future -     Iron, TIBC and Ferritin Panel; Future -     TSH; Future -     Iron, TIBC and Ferritin Panel -     CBC with Differential/Platelet  Medication management Comments: Medicine Reconciliation: patient taking Entresto plus Enalapril? Husband usually gets medications together. Labs okay. Will let Cardiology know.  Orders: -     Vitamin B12; Future -     Vitamin B12  Morbid obesity with BMI of 50.0-59.9, adult ([place]) Comments: Gaining. Not exercising or eating well.   Weight gain -     Hemoglobin A1c; Future -     TSH; Future -     TSH  Elevated TSH Comments: Recheck in 2-6 weeks with free T4.     [name] Reviewed expectations re: course of current medical issues. . Discussed self-management of symptoms. . Outlined signs and symptoms indicating need for more acute intervention. . Patient  verbalized understanding and all questions were answered. [name] Health Maintenance issues including appropriate healthy diet, exercise, and smoking avoidance were discussed with patient. . See orders for this visit as documented in the electronic medical record.  Samantha Deutscher, DO  Records requested if needed. Time spent: 45 minutes, of which >50% was spent in obtaining information about her symptoms, reviewing her previous labs, evaluations, and treatments, counseling her about her condition (please see the discussed topics above), and developing a plan to further investigate it; she had a number of questions which I addressed.

## 2018-06-04 DIAGNOSIS — E1165 Type 2 diabetes mellitus with hyperglycemia: Secondary | ICD-10-CM | POA: Diagnosis not present

## 2018-06-04 DIAGNOSIS — Z9989 Dependence on other enabling machines and devices: Secondary | ICD-10-CM | POA: Diagnosis not present

## 2018-06-04 DIAGNOSIS — Z79899 Other long term (current) drug therapy: Secondary | ICD-10-CM | POA: Diagnosis not present

## 2018-06-04 DIAGNOSIS — R0602 Shortness of breath: Secondary | ICD-10-CM | POA: Diagnosis not present

## 2018-06-04 DIAGNOSIS — G4733 Obstructive sleep apnea (adult) (pediatric): Secondary | ICD-10-CM | POA: Diagnosis not present

## 2018-06-04 DIAGNOSIS — I5022 Chronic systolic (congestive) heart failure: Secondary | ICD-10-CM | POA: Diagnosis not present

## 2018-06-04 LAB — COMPREHENSIVE METABOLIC PANEL
ALT: 20 U/L (ref 0–35)
AST: 16 U/L (ref 0–37)
Albumin: 4 g/dL (ref 3.5–5.2)
Alkaline Phosphatase: 37 U/L — ABNORMAL LOW (ref 39–117)
BUN: 17 mg/dL (ref 6–23)
CO2: 28 mEq/L (ref 19–32)
Calcium: 9.3 mg/dL (ref 8.4–10.5)
Chloride: 101 mEq/L (ref 96–112)
Creatinine, Ser: 0.7 mg/dL (ref 0.40–1.20)
GFR: 87.53 mL/min (ref >60.00)
Glucose, Bld: 123 mg/dL — ABNORMAL HIGH (ref 70–99)
Potassium: 4.2 mEq/L (ref 3.5–5.1)
Sodium: 139 mEq/L (ref 135–145)
Total Bilirubin: 0.8 mg/dL (ref 0.2–1.2)
Total Protein: 6.9 g/dL (ref 6.0–8.3)

## 2018-06-04 LAB — CBC WITH DIFFERENTIAL/PLATELET
Basophils Absolute: 0.1 10*3/uL (ref 0.0–0.1)
Basophils Relative: 0.7 % (ref 0.0–3.0)
Eosinophils Absolute: 0.1 10*3/uL (ref 0.0–0.7)
Eosinophils Relative: 1.3 % (ref 0.0–5.0)
HCT: 43.5 % (ref 36.0–46.0)
Hemoglobin: 14.6 g/dL (ref 12.0–15.0)
Lymphocytes Relative: 21.5 % (ref 12.0–46.0)
Lymphs Abs: 1.7 10*3/uL (ref 0.7–4.0)
MCHC: 33.6 g/dL (ref 30.0–36.0)
MCV: 85.7 fl (ref 78.0–100.0)
Monocytes Absolute: 0.6 10*3/uL (ref 0.1–1.0)
Monocytes Relative: 7.9 % (ref 3.0–12.0)
Neutro Abs: 5.3 10*3/uL (ref 1.4–7.7)
Neutrophils Relative %: 68.6 % (ref 43.0–77.0)
Platelets: 244 10*3/uL (ref 150.0–400.0)
RBC: 5.07 Mil/uL (ref 3.87–5.11)
RDW: 14.2 % (ref 11.5–15.5)
WBC: 7.7 10*3/uL (ref 4.0–10.5)

## 2018-06-04 LAB — HEMOGLOBIN A1C: Hgb A1c MFr Bld: 6.7 % — ABNORMAL HIGH (ref 4.6–6.5)

## 2018-06-04 LAB — VITAMIN B12: Vitamin B-12: 321 pg/mL (ref 211–911)

## 2018-06-04 LAB — TSH: TSH: 6.53 u[IU]/mL — ABNORMAL HIGH (ref 0.35–4.50)

## 2018-06-05 LAB — IRON,TIBC AND FERRITIN PANEL
%SAT: 26 % (calc) (ref 16–45)
Ferritin: 45 ng/mL (ref 16–232)
Iron: 92 ug/dL (ref 45–160)
TIBC: 351 mcg/dL (calc) (ref 250–450)

## 2018-06-06 ENCOUNTER — Encounter: Payer: Self-pay | Admitting: Family Medicine

## 2018-06-06 DIAGNOSIS — R7989 Other specified abnormal findings of blood chemistry: Secondary | ICD-10-CM | POA: Insufficient documentation

## 2018-06-09 ENCOUNTER — Telehealth: Payer: Self-pay | Admitting: *Deleted

## 2018-06-09 ENCOUNTER — Other Ambulatory Visit: Payer: Self-pay

## 2018-06-09 DIAGNOSIS — R946 Abnormal results of thyroid function studies: Secondary | ICD-10-CM

## 2018-06-09 NOTE — Telephone Encounter (Signed)
Call and spoke with pt she stated that she call her husband because he is the one that prepared her medications, she is not taking Enalapril, medication was stop when [name] Loll was started, pt apologize for the misunderstanding with her PCP

## 2018-06-09 NOTE — Telephone Encounter (Signed)
-----   Message from Minus Breeding, MD sent at 06/07/2018  2:52 PM EDT ----- The patient was taken off of enalapril in August when she was started on Entresto.  This was clearly outlined in her AVS and this med has not been on her med list.  Please call and make sure this is the case.  The pharmacy should not be filling enalapril.  Please document this in the chart.

## 2018-06-12 ENCOUNTER — Telehealth: Payer: Self-pay | Admitting: Cardiology

## 2018-06-15 ENCOUNTER — Telehealth: Payer: Self-pay | Admitting: Cardiovascular Disease

## 2018-06-15 NOTE — Telephone Encounter (Signed)
LMTCB to change visit with Dr. Claiborne Billings on 4/22 to virtual visit and also sign up for MyChart.

## 2018-06-16 NOTE — Telephone Encounter (Addendum)
Spoke with pt. She stated she previously had an appt with her PCP which was a video visit and was a "terrible experience for her." She stated there were problems with the video, and then they talked on the phone and she did not feel like her questions were answered or that the provider was able to fully examine her.  Explained to pt that sometimes there are technical difficulties, but hopefully that would not happen again. Explained that Dr. Claiborne Billings is using Doxy.Me to send a text to her phone and she can click on the link which directs her to the visit. Informed pt that it has been working well for most patients.   Pt then stated that she was not sure she wanted to go through all of that again, but she has been having problems with her CPAP machine and has been feeling more short of breath when walking short distances. Pt stated the suction on the face mask is not working as well. She received a new piece for that in the mail and is going to try it. She also stated it feels like there is not enough air coming out and thinks there could be a leak or hole in the tube. Pt agreed to try video visit again, but could not do a morning appointment because she has also been suffering from insomnia.  Pt has been rescheduled to 4/27 at 3:20 pm for an afternoon appointment. Told pt I will send this message to [name], LPN and Dr. Claiborne Billings to advise. Explained that they may go ahead and call to talk about the problem with CPAP machine and symptoms. Pt verbalized thanks and understanding.      Virtual Visit Pre-Appointment Phone Call  "(Name), I am calling you today to discuss your upcoming appointment. We are currently trying to limit exposure to the virus that causes COVID-19 by seeing patients at home rather than in the office."  1. "What is the BEST phone number to call the day of the visit?" - include this in appointment notes  2. "Do you have or have access to (through a family member/friend) a smartphone with  video capability that we can use for your visit?" a. If yes - list this number in appt notes as "cell" (if different from BEST phone #) and list the appointment type as a VIDEO visit in appointment notes b. If no - list the appointment type as a PHONE visit in appointment notes  3. Confirm consent - "In the setting of the current Covid19 crisis, you are scheduled for a (phone or video) visit with your provider on (date) at (time).  Just as we do with many in-office visits, in order for you to participate in this visit, we must obtain consent.  If you'd like, I can send this to your mychart (if signed up) or email for you to review.  Otherwise, I can obtain your verbal consent now.  All virtual visits are billed to your insurance company just like a normal visit would be.  By agreeing to a virtual visit, we'd like you to understand that the technology does not allow for your provider to perform an examination, and thus may limit your provider's ability to fully assess your condition. If your provider identifies any concerns that need to be evaluated in person, we will make arrangements to do so.  Finally, though the technology is pretty good, we cannot assure that it will always work on either your or our end, and in the  setting of a video visit, we may have to convert it to a phone-only visit.  In either situation, we cannot ensure that we have a secure connection.  Are you willing to proceed?" STAFF: Did the patient verbally acknowledge consent to telehealth visit? Document YES/NO here: Yes  4. Advise patient to be prepared - "Two hours prior to your appointment, go ahead and check your blood pressure, pulse, oxygen saturation, and your weight (if you have the equipment to check those) and write them all down. When your visit starts, your provider will ask you for this information. If you have an Apple Watch or Kardia device, please plan to have heart rate information ready on the day of your appointment.  Please have a pen and paper handy nearby the day of the visit as well."  5. Give patient instructions for MyChart download to smartphone OR Doximity/Doxy.me as below if video visit (depending on what platform provider is using)  6. Inform patient they will receive a phone call 15 minutes prior to their appointment time (may be from unknown caller ID) so they should be prepared to answer    TELEPHONE CALL NOTE  [name] has been deemed a candidate for a follow-up tele-health visit to limit community exposure during the Covid-19 pandemic. I spoke with the patient via phone to ensure availability of phone/video source, confirm preferred email & phone number, and discuss instructions and expectations.  I reminded [name] to be prepared with any vital sign and/or heart rhythm information that could potentially be obtained via home monitoring, at the time of her visit. I reminded [name] to expect a phone call prior to her visit.  [name] 06/16/2018 3:12 PM   IF USING DOXIMITY or DOXY.ME - The patient will receive a link just prior to their visit by text.     FULL LENGTH CONSENT FOR TELE-HEALTH VISIT   I hereby voluntarily request, consent and authorize [facility] and its employed or contracted physicians, physician assistants, nurse practitioners or other licensed health care professionals (the Practitioner), to provide me with telemedicine health care services (the "Services") as deemed necessary by the treating Practitioner. I acknowledge and consent to receive the Services by the Practitioner via telemedicine. I understand that the telemedicine visit will involve communicating with the Practitioner through live audiovisual communication technology and the disclosure of certain medical information by electronic transmission. I acknowledge that I have been given the opportunity to request an in-person assessment or other available alternative prior to the telemedicine  visit and am voluntarily participating in the telemedicine visit.  I understand that I have the right to withhold or withdraw my consent to the use of telemedicine in the course of my care at any time, without affecting my right to future care or treatment, and that the Practitioner or I may terminate the telemedicine visit at any time. I understand that I have the right to inspect all information obtained and/or recorded in the course of the telemedicine visit and may receive copies of available information for a reasonable fee.  I understand that some of the potential risks of receiving the Services via telemedicine include:  [name] Delay or interruption in medical evaluation due to technological equipment failure or disruption; . Information transmitted may not be sufficient (e.g. poor resolution of images) to allow for appropriate medical decision making by the Practitioner; and/or  . In rare instances, security protocols could fail, causing a breach of personal health information.  Furthermore, I acknowledge  that it is my responsibility to provide information about my medical history, conditions and care that is complete and accurate to the best of my ability. I acknowledge that Practitioner's advice, recommendations, and/or decision may be based on factors not within their control, such as incomplete or inaccurate data provided by me or distortions of diagnostic images or specimens that may result from electronic transmissions. I understand that the practice of medicine is not an exact science and that Practitioner makes no warranties or guarantees regarding treatment outcomes. I acknowledge that I will receive a copy of this consent concurrently upon execution via email to the email address I last provided but may also request a printed copy by calling the office of [facility].    I understand that my insurance will be billed for this visit.   I have read or had this consent read to me. . I  understand the contents of this consent, which adequately explains the benefits and risks of the Services being provided via telemedicine.  . I have been provided ample opportunity to ask questions regarding this consent and the Services and have had my questions answered to my satisfaction. . I give my informed consent for the services to be provided through the use of telemedicine in my medical care  By participating in this telemedicine visit I agree to the above.

## 2018-06-16 NOTE — Telephone Encounter (Signed)
Anyway you can help with this patients CPAP questions?  she's on Dr.Kellys schedule 04/27, but maybe see if we can assist the issue until then.  Thank you!

## 2018-06-17 ENCOUNTER — Ambulatory Visit: Payer: Medicare Other | Admitting: Cardiovascular Disease

## 2018-06-22 ENCOUNTER — Telehealth: Payer: Self-pay

## 2018-06-22 ENCOUNTER — Telehealth: Payer: Medicare Other | Admitting: Cardiovascular Disease

## 2018-06-22 NOTE — Telephone Encounter (Signed)
Attempted to contact patient regarding upcoming appointment today with Dr.Kelly at 3:20- patient did not answer after several attempts to contact patient. LVM to call back to prepare for the appointment, no response.

## 2018-06-23 ENCOUNTER — Telehealth: Payer: Self-pay | Admitting: Cardiovascular Disease

## 2018-06-23 NOTE — Telephone Encounter (Signed)
Busy signal

## 2018-06-23 NOTE — Telephone Encounter (Signed)
Patient was scheduled, no showed appointment. Advised scheduling to call and reschedule her with next available.

## 2018-06-29 ENCOUNTER — Ambulatory Visit: Payer: Self-pay | Admitting: Family Medicine

## 2018-06-29 NOTE — Telephone Encounter (Signed)
I don't have the ability to find a place for her husband. [facility] has drive-through testing.

## 2018-06-29 NOTE — Telephone Encounter (Addendum)
Last visit was 06/03/2018. Taking prescribed Zoloft daily and Clonazepam prn for anxiety.   Called pt she reports that the Clonazepam is not what she needs. She just needs the stress of her life to just stop. She has health conditions that have her concerned about being around her husband after he returns from being out of the country. He got an e-mail from "the government" yesterday saying that he would have to fly home today. She is concerned because they did not have any time to prepare a place for him to stay.  He is traveling home from [place], he has been there since January. He will travel to [place] and driving home from there. She said that someone told her that our office would be able to help her find somewhere for her husband to stay d/t her health conditions. Prior to being on the plane he was not symptomatic. She is concerned because of his air travel.   Pt also states that she is unable to come in for labs tomorrow because her care is not running. She also does not want to do a virtual visit because she had a very bad experience last time. She reports that she ended up doing a phone visit and she was unable to communicate with Dr. Juleen China the way that she wants to. She wants a hands on visit face-to-face but she doesn't have transportation to the office.   I was unable to get a clear answer from pt explaining what exactly she feels Dr. Juleen China can do to help her.   Forwarding to Dr. Juleen China to advise.   She would also like to know how accurate and how soon the blood test would be able to tell if he currently has the virus. How soon can the test confirm dx.

## 2018-06-29 NOTE — Telephone Encounter (Signed)
Pt. Calling and is very anxious because her husband is coming home from out of the country - she is concerned about possible exposure to COVID 19 from him. States her anxiety is overwhelming because she is trying to find some where for him to stay and self-quarantine before he comes in the home. Having shortness of breath, tearful. States she tried a virtual visit."a couple of weeks ago and it was a Therapist, art." "I'm not tech savvy." Spoke with Hailey in the practice and will send triage note for review. Please advise pt. Leave message if she does not answer.  Answer Assessment - Initial Assessment Questions 1. CONCERN: "What happened that made you call today?"     Overwhelmed  2. ANXIETY SYMPTOM SCREENING: "Can you describe how you have been feeling?"  (e.g., tense, restless, panicky, anxious, keyed up, trouble sleeping, trouble concentrating)     Anxious 3. ONSET: "How long have you been feeling this way?"     Started in Jan. And getting worse 4. RECURRENT: "Have you felt this way before?"  If yes: "What happened that time?" "What helped these feelings go away in the past?"      Yes 5. RISK OF HARM - SUICIDAL IDEATION:  "Do you ever have thoughts of hurting or killing yourself?"  (e.g., yes, no, no but preoccupation with thoughts about death)   - INTENT:  "Do you have thoughts of hurting or killing yourself right NOW?" (e.g., yes, no, N/A)   - PLAN: "Do you have a specific plan for how you would do this?" (e.g., gun, knife, overdose, no plan, N/A)     No 6. RISK OF HARM - HOMICIDAL IDEATION:  "Do you ever have thoughts of hurting or killing someone else?"  (e.g., yes, no, no but preoccupation with thoughts about death)   - INTENT:  "Do you have thoughts of hurting or killing someone right NOW?" (e.g., yes, no, N/A)   - PLAN: "Do you have a specific plan for how you would do this?" (e.g., gun, knife, no plan, N/A)      No 7. FUNCTIONAL IMPAIRMENT: "How have things been going for you overall in your  life? Have you had any more difficulties than usual doing your normal daily activities?"  (e.g., better, same, worse; self-care, school, work, Tree surgeon)     Husband coming home from [place] and she is concerned about getting COVID 19. 8. SUPPORT: "Who is with you now?" "Who do you live with?" "Do you have family or friends nearby who you can talk to?"      Son 9. THERAPIST: "Do you have a counselor or therapist? Name?"     Has a psychiatrist 72. STRESSORS: "Has there been any new stress or recent changes in your life?"       Husband is coming home 58. CAFFEINE ABUSE: "Do you drink caffeinated beverages, and how much each day?" (e.g., coffee, tea, colas)       No 12. SUBSTANCE ABUSE: "Do you use any illegal drugs or alcohol?"       No 13. OTHER SYMPTOMS: "Do you have any other physical symptoms right now?" (e.g., chest pain, palpitations, difficulty breathing, fever)       Some shortness of breath 14. PREGNANCY: "Is there any chance you are pregnant?" "When was your last menstrual period?"       No  Protocols used: ANXIETY AND PANIC ATTACK-A-AH

## 2018-06-29 NOTE — Telephone Encounter (Signed)
Called pt and left VM to call the office.  

## 2018-06-29 NOTE — Telephone Encounter (Signed)
See note

## 2018-06-30 ENCOUNTER — Other Ambulatory Visit: Payer: Medicare Other

## 2018-07-10 NOTE — Telephone Encounter (Signed)
2 attempts have been made to contact pt. Spoke with Dr. Juleen China, advise OK to close encounter.

## 2018-07-26 ENCOUNTER — Other Ambulatory Visit (HOSPITAL_COMMUNITY): Payer: Self-pay | Admitting: Psychiatry

## 2018-08-06 ENCOUNTER — Telehealth: Payer: Self-pay

## 2018-08-06 NOTE — Telephone Encounter (Signed)
Copied from [facility] [id number]. Topic: General - Other >> Aug 06, 2018  2:56 PM Rainey Pines A wrote: Patient would like a callback from [facility] in regards to metformin she was prescribed.

## 2018-08-12 ENCOUNTER — Other Ambulatory Visit: Payer: Self-pay

## 2018-08-12 MED ORDER — METFORMIN HCL 500 MG PO TABS: 1500.0000 mg | ORAL_TABLET | Freq: Two times a day (BID) | ORAL | 0 refills | Status: DC

## 2018-08-12 NOTE — Telephone Encounter (Signed)
Called patient let her now we will call in IR metformin for her to start taking.

## 2018-08-14 ENCOUNTER — Telehealth: Payer: Self-pay | Admitting: Family Medicine

## 2018-08-14 ENCOUNTER — Ambulatory Visit: Payer: Medicare Other | Admitting: Cardiology

## 2018-08-14 NOTE — Telephone Encounter (Signed)
Pt has questions about her new metformin and medicare not covering it. Please advise.

## 2018-08-16 ENCOUNTER — Other Ambulatory Visit: Payer: Self-pay | Admitting: Cardiology

## 2018-08-17 NOTE — Telephone Encounter (Signed)
Will review at f/u tomorrow.

## 2018-08-18 ENCOUNTER — Ambulatory Visit: Payer: Medicare Other | Admitting: Family Medicine

## 2018-08-24 ENCOUNTER — Telehealth: Payer: Self-pay | Admitting: Physical Therapy

## 2018-08-24 NOTE — Telephone Encounter (Signed)
Copied from [place] [phone]. Topic: General - Other >> Aug 21, 2018  5:43 PM Mcneil, Ja-Kwan wrote: Reason for CRM: Vaughan Basta with [facility] called to request that patient be given a new Rx of metformin hydrochloride 500 mg (7 day supply of 35 pills).

## 2018-08-24 NOTE — Telephone Encounter (Signed)
Spoke with [place] at [employer]  There is a quantity limit for Metformin of 5 tablets per day.  Patient currently takes 6 tablets qd.  Quantity override approved per [place].  Case # [id number]  Copy in patient's chart.

## 2018-08-25 ENCOUNTER — Other Ambulatory Visit: Payer: Self-pay | Admitting: Family Medicine

## 2018-08-25 ENCOUNTER — Other Ambulatory Visit: Payer: Self-pay

## 2018-08-25 ENCOUNTER — Encounter: Payer: Self-pay | Admitting: Family Medicine

## 2018-08-25 ENCOUNTER — Ambulatory Visit (INDEPENDENT_AMBULATORY_CARE_PROVIDER_SITE_OTHER): Payer: Medicare Other | Admitting: Family Medicine

## 2018-08-25 VITALS — BP 109/73 | HR 82 | Temp 97.8°F | Ht 63.0 in | Wt 338.8 lb

## 2018-08-25 DIAGNOSIS — E1169 Type 2 diabetes mellitus with other specified complication: Secondary | ICD-10-CM | POA: Diagnosis not present

## 2018-08-25 DIAGNOSIS — Z9989 Dependence on other enabling machines and devices: Secondary | ICD-10-CM

## 2018-08-25 DIAGNOSIS — R002 Palpitations: Secondary | ICD-10-CM | POA: Diagnosis not present

## 2018-08-25 DIAGNOSIS — F418 Other specified anxiety disorders: Secondary | ICD-10-CM

## 2018-08-25 DIAGNOSIS — B372 Candidiasis of skin and nail: Secondary | ICD-10-CM | POA: Diagnosis not present

## 2018-08-25 DIAGNOSIS — I152 Hypertension secondary to endocrine disorders: Secondary | ICD-10-CM

## 2018-08-25 DIAGNOSIS — Z6841 Body Mass Index (BMI) 40.0 and over, adult: Secondary | ICD-10-CM | POA: Diagnosis not present

## 2018-08-25 DIAGNOSIS — R61 Generalized hyperhidrosis: Secondary | ICD-10-CM | POA: Diagnosis not present

## 2018-08-25 DIAGNOSIS — E785 Hyperlipidemia, unspecified: Secondary | ICD-10-CM

## 2018-08-25 DIAGNOSIS — I5022 Chronic systolic (congestive) heart failure: Secondary | ICD-10-CM

## 2018-08-25 DIAGNOSIS — R4789 Other speech disturbances: Secondary | ICD-10-CM

## 2018-08-25 DIAGNOSIS — G4733 Obstructive sleep apnea (adult) (pediatric): Secondary | ICD-10-CM | POA: Diagnosis not present

## 2018-08-25 DIAGNOSIS — I428 Other cardiomyopathies: Secondary | ICD-10-CM | POA: Diagnosis not present

## 2018-08-25 DIAGNOSIS — E1165 Type 2 diabetes mellitus with hyperglycemia: Secondary | ICD-10-CM

## 2018-08-25 DIAGNOSIS — E1159 Type 2 diabetes mellitus with other circulatory complications: Secondary | ICD-10-CM | POA: Diagnosis not present

## 2018-08-25 DIAGNOSIS — R0602 Shortness of breath: Secondary | ICD-10-CM

## 2018-08-25 DIAGNOSIS — R946 Abnormal results of thyroid function studies: Secondary | ICD-10-CM

## 2018-08-25 DIAGNOSIS — I10 Essential (primary) hypertension: Secondary | ICD-10-CM

## 2018-08-25 LAB — CBC WITH DIFFERENTIAL/PLATELET
Basophils Absolute: 0.1 10*3/uL (ref 0.0–0.1)
Basophils Relative: 0.6 % (ref 0.0–3.0)
Eosinophils Absolute: 0.1 10*3/uL (ref 0.0–0.7)
Eosinophils Relative: 1.3 % (ref 0.0–5.0)
HCT: 44 % (ref 36.0–46.0)
Hemoglobin: 14.4 g/dL (ref 12.0–15.0)
Lymphocytes Relative: 15.6 % (ref 12.0–46.0)
Lymphs Abs: 1.5 10*3/uL (ref 0.7–4.0)
MCHC: 32.8 g/dL (ref 30.0–36.0)
MCV: 86.6 fl (ref 78.0–100.0)
Monocytes Absolute: 0.7 10*3/uL (ref 0.1–1.0)
Monocytes Relative: 6.7 % (ref 3.0–12.0)
Neutro Abs: 7.4 10*3/uL (ref 1.4–7.7)
Neutrophils Relative %: 75.8 % (ref 43.0–77.0)
Platelets: 232 10*3/uL (ref 150.0–400.0)
RBC: 5.08 Mil/uL (ref 3.87–5.11)
RDW: 15.8 % — ABNORMAL HIGH (ref 11.5–15.5)
WBC: 9.8 10*3/uL (ref 4.0–10.5)

## 2018-08-25 LAB — COMPREHENSIVE METABOLIC PANEL
ALT: 18 U/L (ref 0–35)
AST: 15 U/L (ref 0–37)
Albumin: 4.1 g/dL (ref 3.5–5.2)
Alkaline Phosphatase: 39 U/L (ref 39–117)
BUN: 21 mg/dL (ref 6–23)
CO2: 29 mEq/L (ref 19–32)
Calcium: 9.4 mg/dL (ref 8.4–10.5)
Chloride: 97 mEq/L (ref 96–112)
Creatinine, Ser: 0.81 mg/dL (ref 0.40–1.20)
GFR: 73.9 mL/min (ref >60.00)
Glucose, Bld: 115 mg/dL — ABNORMAL HIGH (ref 70–99)
Potassium: 4.6 mEq/L (ref 3.5–5.1)
Sodium: 137 mEq/L (ref 135–145)
Total Bilirubin: 0.6 mg/dL (ref 0.2–1.2)
Total Protein: 7.3 g/dL (ref 6.0–8.3)

## 2018-08-25 LAB — T4, FREE: Free T4: 0.67 ng/dL (ref 0.60–1.60)

## 2018-08-25 LAB — TSH: TSH: 3.08 u[IU]/mL (ref 0.35–4.50)

## 2018-08-25 MED ORDER — CLONAZEPAM 1 MG PO TABS: 1.0000 mg | ORAL_TABLET | Freq: Two times a day (BID) | ORAL | 1 refills | Status: DC | PRN

## 2018-08-25 MED ORDER — NYSTATIN 100000 UNIT/GM EX CREA: 1.0000 "application " | TOPICAL_CREAM | Freq: Two times a day (BID) | CUTANEOUS | 0 refills | Status: DC

## 2018-08-25 MED ORDER — DRYSOL 20 % EX SOLN: Freq: Every day | CUTANEOUS | 0 refills | Status: DC

## 2018-08-25 NOTE — Patient Instructions (Signed)
In regards to the over ride of metformin  Quantity override approved per [place].  Case # [id number]

## 2018-08-25 NOTE — Progress Notes (Signed)
[name] is a 53 y.o. female is here for follow up.  History of Present Illness:   [name], CMA acting as scribe for Dr. Briscoe Deutscher.   HPI:   Patient has had increased issues with word finding and memory. This has been on going for a while. Hx of ADHD and recognizes that this is part of her issue. Followed by Psychiatry. FamHx of Alzheimer's.   She has been having issues with her blood pressure getting low and feels like she is getting dizzy with this.   Episodes of SOB that "pops in." Associated with dizziness, nausea. Lasts 20 minutes to a few hours. Has treated by eating a banana for potassium and going to sleep. Sometimes clonazepam helps. Has happened 3-4 times in the last 2 weeks.   She is having increased rash in her groin area that is becoming very sensitive. Saw a little blood after wiping once. No pain. Minimal itch.   Letter for mold in sunroom and leak in roof in master bedroom. She would like to give to rental company for repairs.   Health Maintenance Due  Topic Date Due  . OPHTHALMOLOGY EXAM  05/29/1975   Depression screen [facility] 2/9 11/07/2017 10/18/2016 06/26/2016  Decreased Interest 1 2 2   Down, Depressed, Hopeless 1 3 2   PHQ - 2 Score 2 5 4   Altered sleeping 0 0 2  Tired, decreased energy 3 0 3  Change in appetite 2 1 3   Feeling bad or failure about yourself  1 1 3   Trouble concentrating 3 0 3  Moving slowly or fidgety/restless 0 0 3  Suicidal thoughts 0 1 0  PHQ-9 Score 11 8 21   Difficult doing work/chores Somewhat difficult Not difficult at all Extremely dIfficult  Some recent data might be hidden   PMHx, SurgHx, SocialHx, FamHx, Medications, and Allergies were reviewed in the Visit Navigator and updated as appropriate.   Patient Active Problem List   Diagnosis Date Noted  . Elevated TSH 06/06/2018  . Spondylosis without myelopathy or radiculopathy, lumbar region 12/04/2017  . Type 2 diabetes mellitus with hyperglycemia ([place])   . SOB  (shortness of breath) 06/22/2016  . History of smoking 06/22/2016  . Normal coronary arteries 04/17/2016  . OSA on CPAP 10/28/2014  . Major depressive disorder 10/28/2014  . Chronic systolic CHF (congestive heart failure), NYHA class 3 ([place]) 12/06/2013  . Hyperlipidemia associated with type 2 diabetes mellitus ([place]), on Zocor 04/04/2011  . Hypertension associated with diabetes ([place]) 06/03/2008  . Morbid obesity with BMI of 50.0-59.9, adult ([place]) 07/12/2006  . NICM (nonischemic cardiomyopathy) ([place]) 07/12/2006  . Attention deficit disorder 07/12/2006   Social History   Tobacco Use  . Smoking status: Former Smoker    Packs/day: 0.50    Types: Cigarettes    Quit date: 02/25/1997    Years since quitting: 21.5  . Smokeless tobacco: Never Used  . Tobacco comment: Married, lives with spouse (when he is not traveling) and son  Substance Use Topics  . Alcohol use: No  . Drug use: No   Current Medications and Allergies   Current Outpatient Medications:  .  aluminum chloride (DRYSOL) 20 % external solution, Apply topically at bedtime., Disp: 35 mL, Rfl: 0 .  amLODipine (NORVASC) 5 MG tablet, Take 1 tablet by mouth daily. , Disp: , Rfl:  .  busPIRone (BUSPAR) 10 MG tablet, Take 1 tablet (10 mg total) by mouth 2 (two) times daily., Disp: 60 tablet, Rfl: 1 .  carvedilol (  COREG) 12.5 MG tablet, Take 1 tablet (12.5 mg total) by mouth 2 (two) times daily with a meal., Disp: 180 tablet, Rfl: 1 .  carvedilol (COREG) 25 MG tablet, Take 1 tablet (25 mg total) by mouth 2 (two) times daily with a meal., Disp: 180 tablet, Rfl: 1 .  clonazePAM (KLONOPIN) 1 MG tablet, Take .5 mg qd prn anxiety, Disp: 21 tablet, Rfl: 1 .  clonazePAM (KLONOPIN) 1 MG tablet, Take 1 tablet (1 mg total) by mouth 2 (two) times daily as needed for anxiety., Disp: 40 tablet, Rfl: 1 .  clotrimazole-betamethasone (LOTRISONE) cream, APPLY TO AFFECTED AREA TWICE A DAY, Disp: 45 g, Rfl: 2 .  furosemide (LASIX) 40 MG tablet, Take 1  tablet (40 mg total) by mouth 2 (two) times daily. Take as directed, Disp: 60 tablet, Rfl: 2 .  ibuprofen (ADVIL,MOTRIN) 800 MG tablet, TAKE 1 TABLET BY MOUTH EVERY 8 HOURS AS NEEDED FOR HEADACHE OR MILD PAIN, Disp: 90 tablet, Rfl: 1 .  lamoTRIgine (LAMICTAL) 200 MG tablet, Take 1 tablet (200 mg total) by mouth daily. Patient needs appt. For future refills, Disp: 30 tablet, Rfl: 0 .  metFORMIN (GLUCOPHAGE) 500 MG tablet, Take 3 tablets (1,500 mg total) by mouth 2 (two) times daily with a meal., Disp: 180 tablet, Rfl: 0 .  metFORMIN (GLUCOPHAGE-XR) 500 MG 24 hr tablet, Take 3 tablets (1,500 mg total) by mouth at bedtime. Please specify directions, refills and quantity, Disp: 90 tablet, Rfl: 2 .  nystatin cream (MYCOSTATIN), Apply 1 application topically 2 (two) times daily., Disp: 30 g, Rfl: 0 .  potassium chloride SA (KLOR-CON M20) 20 MEQ tablet, Take 1 tablet (20 mEq total) by mouth daily., Disp: 180 tablet, Rfl: 3 .  sacubitril-valsartan (ENTRESTO) 97-103 MG, Take 1 tablet by mouth 2 (two) times daily., Disp: 180 tablet, Rfl: 2 .  sertraline (ZOLOFT) 100 MG tablet, Take two tablets PO QDAY, Disp: 60 tablet, Rfl: 1 .  simvastatin (ZOCOR) 20 MG tablet, TAKE 1 TABLET BY MOUTH EVERY DAY, Disp: 90 tablet, Rfl: 2 .  spironolactone (ALDACTONE) 25 MG tablet, Take 1 tablet (25 mg total) by mouth daily., Disp: 90 tablet, Rfl: 3   Allergies  Allergen Reactions  . Fluoxetine Other (See Comments)    More depressed  . Cymbalta [Duloxetine Hcl] Other (See Comments)    depressed  . Penicillins     REACTION: rash Has patient had a PCN reaction causing immediate rash, facial/tongue/throat swelling, SOB or lightheadedness with hypotension: YES Has patient had a PCN reaction causing severe rash involving mucus membranes or skin necrosis: NO Has patient had a PCN reaction that required hospitalization: YES Has patient had a PCN reaction occurring within the last 10 years: NO If all of the above answers are  "NO", then may proceed with Cephalosporin use.    Review of Systems   Pertinent items are noted in the HPI. Otherwise, a complete ROS is negative.  Vitals   Vitals:   08/25/18 1100  BP: 109/73  Pulse: 82  Temp: 97.8 F (36.6 C)  TempSrc: Oral  Weight: (!) 338 lb 12.8 oz (153.7 kg)  Height: 5\' 3"  (1.6 m)     Body mass index is 60.02 kg/m.  Physical Exam   Physical Exam Vitals signs and nursing note reviewed.  HENT:     Head: Normocephalic and atraumatic.  Eyes:     Pupils: Pupils are equal, round, and reactive to light.  Neck:     Musculoskeletal: Normal range of motion  and neck supple.  Cardiovascular:     Rate and Rhythm: Normal rate and regular rhythm.     Heart sounds: Murmur present. Systolic murmur present with a grade of 3/6.  Pulmonary:     Effort: Pulmonary effort is normal.  Abdominal:     Palpations: Abdomen is soft.  Skin:    General: Skin is warm.     Capillary Refill: Capillary refill takes less than 2 seconds.     Findings: Rash present. Rash is macular.  Psychiatric:        Speech: Speech is tangential.        Behavior: Behavior normal.    Mini-Cog - 08/25/18 1156    Normal clock drawing test?  yes    How many words correct?  3      Assessment and Plan   [name] was seen today for memory loss, dizziness and rash.  Diagnoses and all orders for this visit:  Morbid obesity with BMI of 50.0-59.9, adult ([place])  Hypertension associated with diabetes ([place])  NICM (nonischemic cardiomyopathy) ([place])  Hyperlipidemia associated with type 2 diabetes mellitus ([place]), on Zocor  Chronic systolic CHF (congestive heart failure), NYHA class 3 (HCC)  OSA on CPAP  Type 2 diabetes mellitus with hyperglycemia, without long-term current use of insulin (HCC)  Word finding difficulty -     CBC with Differential/Platelet -     Comprehensive metabolic panel  Intermittent palpitations -     Ambulatory referral to Cardiology  Situational anxiety -      clonazePAM (KLONOPIN) 1 MG tablet; Take 1 tablet (1 mg total) by mouth 2 (two) times daily as needed for anxiety.  Candidal intertrigo -     nystatin cream (MYCOSTATIN); Apply 1 application topically 2 (two) times daily.  Excessive sweating -     aluminum chloride (DRYSOL) 20 % external solution; Apply topically at bedtime.  SOB (shortness of breath)  Abnormal thyroid function test -     TSH -     T4, free   . Orders and follow up as documented in [place], reviewed diet, exercise and weight control, cardiovascular risk and specific lipid/LDL goals reviewed, reviewed medications and side effects in detail.  . Reviewed expectations re: course of current medical issues. . Outlined signs and symptoms indicating need for more acute intervention. . Patient verbalized understanding and all questions were answered. . Patient received an After Visit Summary.  CMA served as Education administrator during this visit. History, Physical, and Plan performed by medical provider. The above documentation has been reviewed and is accurate and complete. Briscoe Deutscher, D.O.  Briscoe Deutscher, DO [place], [place] 08/25/2018  Records requested if needed. Time spent: 50 minutes, of which >50% was spent in obtaining information about her symptoms, reviewing her previous labs, evaluations, and treatments, counseling her about her condition (please see the discussed topics above), and developing a plan to further investigate it; she had a number of questions which I addressed.

## 2018-08-26 ENCOUNTER — Other Ambulatory Visit: Payer: Self-pay | Admitting: Physical Therapy

## 2018-08-26 ENCOUNTER — Encounter: Payer: Self-pay | Admitting: Physical Therapy

## 2018-08-26 ENCOUNTER — Telehealth: Payer: Self-pay | Admitting: Family Medicine

## 2018-08-26 ENCOUNTER — Other Ambulatory Visit: Payer: Self-pay

## 2018-08-26 ENCOUNTER — Telehealth: Payer: Self-pay

## 2018-08-26 DIAGNOSIS — I5022 Chronic systolic (congestive) heart failure: Secondary | ICD-10-CM

## 2018-08-26 DIAGNOSIS — R002 Palpitations: Secondary | ICD-10-CM

## 2018-08-26 MED ORDER — METFORMIN HCL 500 MG PO TABS: 1000.0000 mg | ORAL_TABLET | Freq: Two times a day (BID) | ORAL | 0 refills | Status: DC

## 2018-08-26 NOTE — Telephone Encounter (Signed)
See note

## 2018-08-26 NOTE — Telephone Encounter (Signed)
Spoke to patient and clarified that Metformin 500 mg tablet dosage is supposed to be 2 tablets po BID.  Spoke to [place] at [employer] on [address].    Patient aware

## 2018-08-26 NOTE — Telephone Encounter (Signed)
While speaking with patient to clarify dosage of Metformin, she requested that I mail a copy of her most recent medication list as well as a letter that was written by Dr. Juleen China back in December of 2019 regarding a leak in the ceiling of her condo.  An updated letter dated today reiterating the same concerns was mailed to patient per her request.    She states nothing has been done about the issue and this additional documentation will help her case.  Copies of all of the above were mailed to patient per her request.

## 2018-08-26 NOTE — Telephone Encounter (Signed)
Patient called regarding her medication  metFORMIN, patient states the pharmacy only has a 30 day supply and Patient states she needs a 90 day supply due to insurance purposes. Patient is also confused about medication dosage and if the right release dosage.  Call back # [phone]  [id number] [address], [place] (Phone) [phone] (Fax)

## 2018-08-30 ENCOUNTER — Other Ambulatory Visit: Payer: Self-pay | Admitting: Family Medicine

## 2018-09-11 ENCOUNTER — Other Ambulatory Visit (HOSPITAL_COMMUNITY): Payer: Self-pay | Admitting: Psychiatry

## 2018-09-11 DIAGNOSIS — F331 Major depressive disorder, recurrent, moderate: Secondary | ICD-10-CM

## 2018-09-11 DIAGNOSIS — F418 Other specified anxiety disorders: Secondary | ICD-10-CM

## 2018-09-20 ENCOUNTER — Other Ambulatory Visit: Payer: Self-pay | Admitting: Family Medicine

## 2018-09-20 DIAGNOSIS — G8929 Other chronic pain: Secondary | ICD-10-CM

## 2018-10-06 ENCOUNTER — Ambulatory Visit (INDEPENDENT_AMBULATORY_CARE_PROVIDER_SITE_OTHER): Payer: Medicare Other | Admitting: Psychiatry

## 2018-10-06 ENCOUNTER — Other Ambulatory Visit: Payer: Self-pay

## 2018-10-06 ENCOUNTER — Encounter (HOSPITAL_COMMUNITY): Payer: Self-pay | Admitting: Psychiatry

## 2018-10-06 DIAGNOSIS — F418 Other specified anxiety disorders: Secondary | ICD-10-CM

## 2018-10-06 DIAGNOSIS — F3341 Major depressive disorder, recurrent, in partial remission: Secondary | ICD-10-CM

## 2018-10-06 DIAGNOSIS — F331 Major depressive disorder, recurrent, moderate: Secondary | ICD-10-CM

## 2018-10-06 MED ORDER — BUSPIRONE HCL 10 MG PO TABS: 10.0000 mg | ORAL_TABLET | Freq: Two times a day (BID) | ORAL | 0 refills | Status: DC

## 2018-10-06 MED ORDER — CLONAZEPAM 0.5 MG PO TABS: 0.5000 mg | ORAL_TABLET | Freq: Two times a day (BID) | ORAL | 0 refills | Status: DC | PRN

## 2018-10-06 MED ORDER — SERTRALINE HCL 100 MG PO TABS: ORAL_TABLET | ORAL | 0 refills | Status: DC

## 2018-10-06 MED ORDER — LAMOTRIGINE 200 MG PO TABS: 200.0000 mg | ORAL_TABLET | Freq: Every day | ORAL | 0 refills | Status: DC

## 2018-10-06 NOTE — Progress Notes (Signed)
BH MD/PA/NP OP Progress Note  10/06/2018 9:40 AM [name]  MRN:  [id number]  Chief Complaint: Medication management appointment HPI: This medication management appointment was phone based.  Limitations regarding this mode of communication reviewed and patient identity verified using 2 identifiers. 53 year old female, history of MDD and anxiety.  History of chronic medical illnesses (cardiomyopathy, CHF, Sleep Apnea).  She lives with her husband and 13 year old son. Patient had last been seen in November 2019.  She recently contacted clinic and requested a follow-up appointment. She reports that she has been struggling with some increased anxiety, particularly in the context of COVID epidemic.  Due to her chronic illnesses she has made efforts to minimize contact with others/staying side is much as possible.  She is worried about whether her son was going into 12th grade will be attending classes virtually or not, she also worries about "the general state that our country is in "I will think it has ever been this bad."  She also endorses some depression but tends to emphasize anxiety as most significant symptom at this time.  Denies suicidal ideations, is future oriented, stating she is looking forward to pandemic to be over and life to normalize for her.  She reports her husband has been very supportive, although there is some chronic marital tension as in the past he has traveled out of the country without her knowledge. She reports she has been generally compliant with medications and denies having run out of any of her psychiatric medications.  She states she got refills earlier this year and that her PCP also renewed medications. Denies medication side effects. States she has been taking Klonopin at 0.5 mg twice daily several times out of the week due to anxiety.  Denies taking more than prescribed. Patient's affect appears reactive and appropriate, and during session she was noted to laugh at  times appropriately.   Visit Diagnosis: MDD Past Psychiatric History:   Past Medical History:  Past Medical History:  Diagnosis Date  . ADD (attention deficit disorder)   . Anxiety   . Arthritis    both knees right worse than left  . Cardiomyopathy, peripartum, postpartum   . Carpal tunnel syndrome of right wrist   . CHF (congestive heart failure) ([place])   . Chronic systolic CHF (congestive heart failure), NYHA class 3 ([place]) 12/06/2013  . Depression   . Dyspnea   . Essential hypertension, benign   . Gallstones   . History of smoking 06/22/2016  . Hyperlipidemia associated with type 2 diabetes mellitus ([place]), on Zocor 04/04/2011  . Hypertension associated with diabetes ([place]) 06/03/2008  . Migraines   . Morbid obesity ([place])   . Morbid obesity with BMI of 50.0-59.9, adult ([place]) 07/12/2006  . NICM (nonischemic cardiomyopathy) ([place]) 07/12/2006   01/16/18 ECHO:   - Procedure narrative: Transthoracic echocardiography. Image   quality was suboptimal. The study was technically difficult.   Intravenous contrast (Definity) was administered. - Left ventricle: The cavity size was moderately dilated. Wall   thickness was increased in a pattern of mild LVH. Systolic   function was moderately to severely reduced. The estimated   ejection fraction w  . Normal coronary arteries 04/17/2016  . OSA (obstructive sleep apnea)   . OSA on CPAP 10/28/2014  . Plantar fasciitis   . Spinal stenosis    back pain  . Spondylosis without myelopathy or radiculopathy, lumbar region 12/04/2017  . Type 2 diabetes mellitus with hyperglycemia [facility])     Past Surgical History:  Procedure Laterality Date  . CHOLECYSTECTOMY    . DILATATION & CURETTAGE/HYSTEROSCOPY WITH MYOSURE N/A 10/31/2017   Procedure: DILATATION & CURETTAGE/HYSTEROSCOPY WITH MYOSURE;  Surgeon: Salvadore Dom, MD;  Location: [facility];  Service: Gynecology;  Laterality: N/A;  . RIGHT/LEFT HEART CATH AND CORONARY ANGIOGRAPHY N/A 04/11/2016   Procedure:  Right/Left Heart Cath and Coronary Angiography;  Surgeon: Burnell Blanks, MD;  Location: [facility];  Service: Cardiovascular;  Laterality: N/A;  . sonogram for blood clots     no blockages    Family Psychiatric History:   Family History:  Family History  Problem Relation Age of Onset  . Depression Mother   . Anxiety disorder Mother   . Diabetes Mother   . Hypertension Mother   . ADD / ADHD Father   . Diabetes Father   . Hypertension Father   . Hypertension Other   . Diabetes Other   . Colitis Other   . Alcohol abuse Other     Social History:  Social History   Socioeconomic History  . Marital status: Married    Spouse name: Not on file  . Number of children: Not on file  . Years of education: Not on file  . Highest education level: Not on file  Occupational History  . Not on file  Social Needs  . Financial resource strain: Not on file  . Food insecurity    Worry: Not on file    Inability: Not on file  . Transportation needs    Medical: Not on file    Non-medical: Not on file  Tobacco Use  . Smoking status: Former Smoker    Packs/day: 0.50    Types: Cigarettes    Quit date: 02/25/1997    Years since quitting: 21.6  . Smokeless tobacco: Never Used  . Tobacco comment: Married, lives with spouse (when he is not traveling) and son  Substance and Sexual Activity  . Alcohol use: No  . Drug use: No  . Sexual activity: Yes    Partners: Male    Birth control/protection: None  Lifestyle  . Physical activity    Days per week: Not on file    Minutes per session: Not on file  . Stress: Not on file  Relationships  . Social Herbalist on phone: Not on file    Gets together: Not on file    Attends religious service: Not on file    Active member of club or organization: Not on file    Attends meetings of clubs or organizations: Not on file    Relationship status: Not on file  Other Topics Concern  . Not on file  Social History Narrative  .  Not on file    Allergies:  Allergies  Allergen Reactions  . Fluoxetine Other (See Comments)    More depressed  . Cymbalta [Duloxetine Hcl] Other (See Comments)    depressed  . Penicillins     REACTION: rash Has patient had a PCN reaction causing immediate rash, facial/tongue/throat swelling, SOB or lightheadedness with hypotension: YES Has patient had a PCN reaction causing severe rash involving mucus membranes or skin necrosis: NO Has patient had a PCN reaction that required hospitalization: YES Has patient had a PCN reaction occurring within the last 10 years: NO If all of the above answers are "NO", then may proceed with Cephalosporin use.     Metabolic Disorder Labs: Lab Results  Component Value Date   HGBA1C 6.7 (H) 06/04/2018  MPG 139.85 10/29/2017   MPG 125.5 10/19/2016   MPG 125.5 10/19/2016   No results found for: PROLACTIN Lab Results  Component Value Date   CHOL 204 (H) 07/18/2016   TRIG 123.0 07/18/2016   HDL 51.10 07/18/2016   CHOLHDL 4 07/18/2016   VLDL 24.6 07/18/2016   LDLCALC 128 (H) 07/18/2016   LDLCALC 78 11/08/2013   Lab Results  Component Value Date   TSH 3.08 08/25/2018   TSH 6.53 (H) 06/04/2018    Therapeutic Level Labs: No results found for: LITHIUM No results found for: VALPROATE No components found for:  CBMZ  Current Medications: Current Outpatient Medications  Medication Sig Dispense Refill  . aluminum chloride (DRYSOL) 20 % external solution Apply topically at bedtime. 35 mL 0  . amLODipine (NORVASC) 5 MG tablet Take 1 tablet by mouth daily.     . busPIRone (BUSPAR) 10 MG tablet Take 1 tablet (10 mg total) by mouth 2 (two) times daily. 60 tablet 1  . carvedilol (COREG) 12.5 MG tablet Take 1 tablet (12.5 mg total) by mouth 2 (two) times daily with a meal. 180 tablet 1  . carvedilol (COREG) 25 MG tablet Take 1 tablet (25 mg total) by mouth 2 (two) times daily with a meal. 180 tablet 1  . clonazePAM (KLONOPIN) 1 MG tablet Take .5 mg  qd prn anxiety 21 tablet 1  . clonazePAM (KLONOPIN) 1 MG tablet Take 1 tablet (1 mg total) by mouth 2 (two) times daily as needed for anxiety. 40 tablet 1  . clotrimazole-betamethasone (LOTRISONE) cream APPLY TO AFFECTED AREA TWICE A DAY 45 g 2  . furosemide (LASIX) 40 MG tablet Take 1 tablet (40 mg total) by mouth 2 (two) times daily. Take as directed 60 tablet 2  . ibuprofen (ADVIL) 800 MG tablet TAKE 1 TABLET BY MOUTH EVERY 8 HOURS AS NEEDED FOR HEADACHE OR MILD PAIN 90 tablet 1  . KLOR-CON M20 20 MEQ tablet TAKE 1 TABLET BY MOUTH TWICE A DAY 180 tablet 3  . lamoTRIgine (LAMICTAL) 200 MG tablet Take 1 tablet (200 mg total) by mouth daily. Patient needs appt. For future refills 30 tablet 0  . metFORMIN (GLUCOPHAGE) 500 MG tablet Take 2 tablets (1,000 mg total) by mouth 2 (two) times daily with a meal. 180 tablet 0  . nystatin cream (MYCOSTATIN) Apply 1 application topically 2 (two) times daily. 30 g 0  . sacubitril-valsartan (ENTRESTO) 97-103 MG Take 1 tablet by mouth 2 (two) times daily. 180 tablet 2  . sertraline (ZOLOFT) 100 MG tablet Take two tablets PO QDAY 60 tablet 1  . simvastatin (ZOCOR) 20 MG tablet TAKE 1 TABLET BY MOUTH EVERY DAY 90 tablet 2  . spironolactone (ALDACTONE) 25 MG tablet Take 1 tablet (25 mg total) by mouth daily. 90 tablet 3   No current facility-administered medications for this visit.      Musculoskeletal: NA  Psychiatric Specialty Exam: Please take into account limitations in obtaining a full mental status exam in the context of phone communication ROS  There were no vitals taken for this visit.There is no height or weight on file to calculate BMI.  General Appearance: NA  Eye Contact:  NA  Speech:  Normal Rate  Volume:  Normal  Mood:  Reports some depression  Affect:  Presents reactive and full in range, laughs appropriately during session at times  Thought Process:  Linear and Descriptions of Associations: Intact  Orientation:  Full (Time, Place, and  Person)  Thought Content: Denies  any hallucinations no delusions are expressed   Suicidal Thoughts:  No denies suicidal or self-injurious ideations, denies homicidal or violent ideations  Homicidal Thoughts:  No  Memory:  Recent and remote grossly intact  Judgement:  Other:  Present  Insight:  Present  Psychomotor Activity:  NA  Concentration:  Concentration: Good and Attention Span: Good  Recall:  Good  Fund of Knowledge: Good  Language: Good  Akathisia:  Negative  Handed:  Right  AIMS (if indicated): not done   Assets:  Communication Skills Desire for Improvement Housing Resilience Social Support  ADL's:  NA  Cognition: WNL  Sleep:  Good-uses CPAP   Screenings: AUDIT     Admission (Discharged) from 12/05/2013 in [facility]  Alcohol Use Disorder Identification Test Final Score (AUDIT)  0    PHQ2-9     Office Visit from 11/07/2017 in [facility] Visit from 10/18/2016 in [facility] from 06/26/2016 in [facility] from 07/19/2015 in [facility] Visit from 07/05/2015 in [facility] at [facility]  PHQ-2 Total Score  2  5  4  2   0  PHQ-9 Total Score  11  8  21  5   -       Assessment and Plan:  53 year old female with a history of MDD and anxiety, chronic medical illnesses.  Had been lost to follow-up since November 2019.  Recently called and asked to reschedule appointment.  She reports that in general she has been stable although does endorse some increased anxiety and mild depression in the context of stressors related to COVID epidemic (increased isolation, concerns about becoming infected, concerns about her teenage son and school, etc.).  She reports she has not run out of medication and has been taking medications regularly.  She denies side effects.  She denies any  suicidal ideations and during this conversation she appeared to exhibit a full range of affect. She is requesting refills for medications, we have reviewed side effects.  She is asking for a 90-day refill as this would significantly decrease her co-pay, but agrees to follow-up appointment in 1 month.  She agrees to contact clinic sooner should there be any worsening or concern prior. Renew BuSpar 10 mg twice daily, Lamictal 200 mg daily, Zoloft 200 mg daily, Klonopin 0.5 mg twice daily PRN for anxiety.  Jenne Campus, MD 10/06/2018, 9:40 AM

## 2018-10-07 ENCOUNTER — Telehealth: Payer: Self-pay | Admitting: Family Medicine

## 2018-10-07 ENCOUNTER — Other Ambulatory Visit: Payer: Self-pay

## 2018-10-07 MED ORDER — METFORMIN HCL 500 MG PO TABS: 1000.0000 mg | ORAL_TABLET | Freq: Two times a day (BID) | ORAL | 0 refills | Status: DC

## 2018-10-07 NOTE — Telephone Encounter (Signed)
Sent in called patient and let know.

## 2018-10-07 NOTE — Telephone Encounter (Signed)
Medication Refill - Medication: metFORMIN (GLUCOPHAGE) 500 MG tablet (90 DAY SUPPLY) Pharmacy has attempted multiple times to get prescription sent over.) Patient would like a callback from Dr. Juleen China nurse once medication has been called in.)   Has the patient contacted their pharmacy?Yes (Agent: If no, request that the patient contact the pharmacy for the refill.) (Agent: If yes, when and what did the pharmacy advise?)Contact PCP  Preferred Pharmacy (with phone number or street name):  [id number] [address], [place] [phone] (Phone) [phone] (Fax)      Agent: Please be advised that RX refills may take up to 3 business days. We ask that you follow-up with your pharmacy.

## 2018-10-07 NOTE — Telephone Encounter (Signed)
See note

## 2018-10-13 ENCOUNTER — Encounter: Payer: Self-pay | Admitting: Cardiology

## 2018-10-13 ENCOUNTER — Ambulatory Visit (INDEPENDENT_AMBULATORY_CARE_PROVIDER_SITE_OTHER): Payer: Medicare Other | Admitting: Cardiology

## 2018-10-13 ENCOUNTER — Other Ambulatory Visit: Payer: Self-pay

## 2018-10-13 VITALS — BP 104/80 | HR 83 | Temp 97.4°F | Ht 64.0 in | Wt 329.0 lb

## 2018-10-13 DIAGNOSIS — I5022 Chronic systolic (congestive) heart failure: Secondary | ICD-10-CM | POA: Diagnosis not present

## 2018-10-13 DIAGNOSIS — I10 Essential (primary) hypertension: Secondary | ICD-10-CM | POA: Diagnosis not present

## 2018-10-13 NOTE — Progress Notes (Signed)
HPI The patient presents for evaluation of cardiomyopathy.   She had cath 04/11/16. This revealed normal coronaries, EF 25-30%, mild pulmonary HTN, and elevated LVEDP.  She was in the hospital in August 2018.  She had acute on chronic systolic and diastolic HF. She presents for follow up.   EF 12/2017 was 30 - 35%.  She came in today for follow-up.   She has done relatively well since I saw her.  She is gained weight since the pandemic started.  She has occasional swelling and takes an extra 40mg  Lasix.  She has occasional dizzy episodes.  She had some mild nausea.  She has random headaches.  She is not describing PND or orthopnea.  She says some days the breathing is better than others and she can walk further but she is not as severe she was when she was hospitalized.  Allergies  Allergen Reactions  . Fluoxetine Other (See Comments)    More depressed  . Cymbalta [Duloxetine Hcl] Other (See Comments)    depressed  . Penicillins     REACTION: rash Has patient had a PCN reaction causing immediate rash, facial/tongue/throat swelling, SOB or lightheadedness with hypotension: YES Has patient had a PCN reaction causing severe rash involving mucus membranes or skin necrosis: NO Has patient had a PCN reaction that required hospitalization: YES Has patient had a PCN reaction occurring within the last 10 years: NO If all of the above answers are "NO", then may proceed with Cephalosporin use.     Current Outpatient Medications  Medication Sig Dispense Refill  . aluminum chloride (DRYSOL) 20 % external solution Apply topically at bedtime. 35 mL 0  . amLODipine (NORVASC) 5 MG tablet Take 1 tablet by mouth daily.     . busPIRone (BUSPAR) 10 MG tablet Take 1 tablet (10 mg total) by mouth 2 (two) times daily. 180 tablet 0  . carvedilol (COREG) 12.5 MG tablet Take 1 tablet (12.5 mg total) by mouth 2 (two) times daily with a meal. 180 tablet 1  . carvedilol (COREG) 25 MG tablet Take 1 tablet (25 mg  total) by mouth 2 (two) times daily with a meal. 180 tablet 1  . clonazePAM (KLONOPIN) 0.5 MG tablet Take 1 tablet (0.5 mg total) by mouth 2 (two) times daily as needed for anxiety. 60 tablet 0  . clotrimazole-betamethasone (LOTRISONE) cream APPLY TO AFFECTED AREA TWICE A DAY 45 g 2  . furosemide (LASIX) 40 MG tablet Take 1 tablet (40 mg total) by mouth 2 (two) times daily. Take as directed 60 tablet 2  . ibuprofen (ADVIL) 800 MG tablet TAKE 1 TABLET BY MOUTH EVERY 8 HOURS AS NEEDED FOR HEADACHE OR MILD PAIN 90 tablet 1  . KLOR-CON M20 20 MEQ tablet TAKE 1 TABLET BY MOUTH TWICE A DAY 180 tablet 3  . lamoTRIgine (LAMICTAL) 200 MG tablet Take 1 tablet (200 mg total) by mouth daily. Patient needs appt. For future refills 90 tablet 0  . metFORMIN (GLUCOPHAGE) 500 MG tablet Take 2 tablets (1,000 mg total) by mouth 2 (two) times daily with a meal. 180 tablet 0  . nystatin cream (MYCOSTATIN) Apply 1 application topically 2 (two) times daily. 30 g 0  . sacubitril-valsartan (ENTRESTO) 97-103 MG Take 1 tablet by mouth 2 (two) times daily. 180 tablet 2  . sertraline (ZOLOFT) 100 MG tablet Take two tablets PO QDAY 180 tablet 0  . simvastatin (ZOCOR) 20 MG tablet TAKE 1 TABLET BY MOUTH EVERY DAY 90 tablet  2  . spironolactone (ALDACTONE) 25 MG tablet Take 1 tablet (25 mg total) by mouth daily. 90 tablet 3   No current facility-administered medications for this visit.     Past Medical History:  Diagnosis Date  . ADD (attention deficit disorder)   . Anxiety   . Arthritis    both knees right worse than left  . Cardiomyopathy, peripartum, postpartum   . Carpal tunnel syndrome of right wrist   . CHF (congestive heart failure) ([place])   . Chronic systolic CHF (congestive heart failure), NYHA class 3 ([place]) 12/06/2013  . Depression   . Dyspnea   . Essential hypertension, benign   . Gallstones   . History of smoking 06/22/2016  . Hyperlipidemia associated with type 2 diabetes mellitus ([place]), on Zocor 04/04/2011   . Hypertension associated with diabetes ([place]) 06/03/2008  . Migraines   . Morbid obesity ([place])   . Morbid obesity with BMI of 50.0-59.9, adult ([place]) 07/12/2006  . NICM (nonischemic cardiomyopathy) ([place]) 07/12/2006   01/16/18 ECHO:   - Procedure narrative: Transthoracic echocardiography. Image   quality was suboptimal. The study was technically difficult.   Intravenous contrast (Definity) was administered. - Left ventricle: The cavity size was moderately dilated. Wall   thickness was increased in a pattern of mild LVH. Systolic   function was moderately to severely reduced. The estimated   ejection fraction w  . Normal coronary arteries 04/17/2016  . OSA (obstructive sleep apnea)   . OSA on CPAP 10/28/2014  . Plantar fasciitis   . Spinal stenosis    back pain  . Spondylosis without myelopathy or radiculopathy, lumbar region 12/04/2017  . Type 2 diabetes mellitus with hyperglycemia [facility])     Past Surgical History:  Procedure Laterality Date  . CHOLECYSTECTOMY    . DILATATION & CURETTAGE/HYSTEROSCOPY WITH MYOSURE N/A 10/31/2017   Procedure: DILATATION & CURETTAGE/HYSTEROSCOPY WITH MYOSURE;  Surgeon: Salvadore Dom, MD;  Location: [facility];  Service: Gynecology;  Laterality: N/A;  . RIGHT/LEFT HEART CATH AND CORONARY ANGIOGRAPHY N/A 04/11/2016   Procedure: Right/Left Heart Cath and Coronary Angiography;  Surgeon: Burnell Blanks, MD;  Location: [facility];  Service: Cardiovascular;  Laterality: N/A;  . sonogram for blood clots     no blockages    ROS:   As stated in the HPI and negative for all other systems.   PHYSICAL EXAM BP 104/80 (BP Location: Left Wrist, Patient Position: Sitting, Cuff Size: Normal)   Pulse 83   Temp (!) 97.4 F (36.3 C)   Ht 5\' 4"  (1.626 m)   Wt (!) 329 lb (149.2 kg)   BMI 56.47 kg/m   GENERAL:  Well appearing NECK:  No jugular venous distention, waveform within normal limits, carotid upstroke brisk and symmetric, no bruits, no thyromegaly  LUNGS:  Clear to auscultation bilaterally CHEST:  Unremarkable HEART:  PMI not displaced or sustained,S1 and S2 within normal limits, no S3, no S4, no clicks, no rubs, no murmurs ABD:  Flat, positive bowel sounds normal in frequency in pitch, no bruits, no rebound, no guarding, no midline pulsatile mass, no hepatomegaly, no splenomegaly EXT:  2 plus pulses throughout, no edema, no cyanosis no clubbing  EKG:   NSR, rate 83, leftward axis, intervals WNL, poor anterior R wave progression.  No acute ST T wave changes   Lab Results  Component Value Date   TSH 3.08 08/25/2018     ASSESSMENT AND PLAN  DYSPNEA -  Her breathing is at baseline.  She needs to lose weight and watch salt.  I am going to repeat an echo in Nov.  Otherwise no change in therapy.   OBESITY - I again encouraged weight loss.    I am proud of her weight loss and I encouraged slow and steadily that she continue with this.   CHRONIC SYSTOLIC HF -  No change in therapy.  BMET was OK in June.     ESSENTIAL HYPERTENSION, BENIGN -  This is being managed in the context of treating his CHF  ANXIETY DEPRESSION - She is followed by Dr. Juleen China and is up to date with follow up.   SLEEP APNEA - She is getting this treated.  I will defer to Dr. Claiborne Billings.

## 2018-10-13 NOTE — Patient Instructions (Addendum)
Medication Instructions:  Your physician recommends that you continue on your current medications as directed. Please refer to the Current Medication list given to you today.  If you need a refill on your cardiac medications before your next appointment, please call your pharmacy.   Lab work: NONE   Testing/Procedures: Your physician has requested that you have an echocardiogram. Echocardiography is a painless test that uses sound waves to create images of your heart. It provides your doctor with information about the size and shape of your heart and how well your heart's chambers and valves are working. This procedure takes approximately one hour. There are no restrictions for this procedure. IN November  [facility] [address]  Follow-Up: At [facility], you and your health needs are our priority.  As part of our continuing mission to provide you with exceptional heart care, we have created designated Provider Care Teams.  These Care Teams include your primary Cardiologist (physician) and Advanced Practice Providers (APPs -  Physician Assistants and Nurse Practitioners) who all work together to provide you with the care you need, when you need it. You will need a follow up appointment in 3 months FEW DAYS AFTER ECHO  You may see DR [facility]  or one of the following Advanced Practice Providers on your designated Care Team:   Rosaria Ferries, PA-C . Jory Sims, DNP, ANP

## 2018-10-19 ENCOUNTER — Encounter: Payer: Self-pay | Admitting: *Deleted

## 2018-11-15 ENCOUNTER — Other Ambulatory Visit: Payer: Self-pay | Admitting: Family Medicine

## 2018-11-16 ENCOUNTER — Other Ambulatory Visit: Payer: Self-pay

## 2018-11-16 DIAGNOSIS — I5022 Chronic systolic (congestive) heart failure: Secondary | ICD-10-CM

## 2018-11-16 MED ORDER — FUROSEMIDE 40 MG PO TABS: 40.0000 mg | ORAL_TABLET | Freq: Two times a day (BID) | ORAL | 7 refills | Status: DC

## 2018-11-26 ENCOUNTER — Ambulatory Visit: Payer: Medicare Other

## 2018-12-09 ENCOUNTER — Other Ambulatory Visit: Payer: Self-pay | Admitting: Family Medicine

## 2018-12-09 DIAGNOSIS — G8929 Other chronic pain: Secondary | ICD-10-CM

## 2018-12-27 ENCOUNTER — Other Ambulatory Visit (HOSPITAL_COMMUNITY): Payer: Self-pay | Admitting: Psychiatry

## 2018-12-27 DIAGNOSIS — F331 Major depressive disorder, recurrent, moderate: Secondary | ICD-10-CM

## 2018-12-30 ENCOUNTER — Other Ambulatory Visit: Payer: Self-pay

## 2018-12-30 ENCOUNTER — Ambulatory Visit (HOSPITAL_COMMUNITY): Payer: Medicare Other | Attending: Cardiovascular Disease

## 2018-12-30 DIAGNOSIS — I10 Essential (primary) hypertension: Secondary | ICD-10-CM | POA: Insufficient documentation

## 2018-12-30 DIAGNOSIS — I5022 Chronic systolic (congestive) heart failure: Secondary | ICD-10-CM | POA: Insufficient documentation

## 2018-12-31 ENCOUNTER — Telehealth: Payer: Self-pay

## 2018-12-31 NOTE — Telephone Encounter (Signed)
Called pt with echo results. Left message per DPR.

## 2019-01-07 NOTE — Progress Notes (Signed)
HPI The patient presents for evaluation of cardiomyopathy.   She had cath 04/11/16. This revealed normal coronaries, EF 25-30%, mild pulmonary HTN, and elevated LVEDP.  She was in the hospital in August 2018.  She had acute on chronic systolic and diastolic HF. She presents for follow up.   EF 12/2017 was 30 - 35%.  Her most recent echo demonstrated the EF to be 50 - 55%.    She presents with lots of complaints around anxiety.  She is been watching too much of the election.  However, she is really not having any new cardiovascular complaints.  She gets short of breath when she walks moderate distance but she is not having any resting shortness of breath.  She is not having any PND or orthopnea.  She has no new chest pressure, neck or arm discomfort.  Allergies  Allergen Reactions  . Fluoxetine Other (See Comments)    More depressed  . Cymbalta [Duloxetine Hcl] Other (See Comments)    depressed  . Penicillins     REACTION: rash Has patient had a PCN reaction causing immediate rash, facial/tongue/throat swelling, SOB or lightheadedness with hypotension: YES Has patient had a PCN reaction causing severe rash involving mucus membranes or skin necrosis: NO Has patient had a PCN reaction that required hospitalization: YES Has patient had a PCN reaction occurring within the last 10 years: NO If all of the above answers are "NO", then may proceed with Cephalosporin use.     Current Outpatient Medications  Medication Sig Dispense Refill  . amLODipine (NORVASC) 5 MG tablet Take 1 tablet by mouth daily.     . busPIRone (BUSPAR) 10 MG tablet Take 1 tablet (10 mg total) by mouth 2 (two) times daily. 180 tablet 0  . carvedilol (COREG) 12.5 MG tablet Take 1 tablet (12.5 mg total) by mouth 2 (two) times daily with a meal. 180 tablet 1  . carvedilol (COREG) 25 MG tablet Take 1 tablet (25 mg total) by mouth 2 (two) times daily with a meal. 180 tablet 1  . clonazePAM (KLONOPIN) 0.5 MG tablet Take 1  tablet (0.5 mg total) by mouth 2 (two) times daily as needed for anxiety. 60 tablet 0  . clotrimazole-betamethasone (LOTRISONE) cream APPLY TO AFFECTED AREA TWICE A DAY 45 g 2  . furosemide (LASIX) 40 MG tablet Take 1 tablet (40 mg total) by mouth 2 (two) times daily. Take as directed 30 tablet 7  . ibuprofen (ADVIL) 800 MG tablet TAKE 1 TABLET BY MOUTH EVERY 8 HOURS AS NEEDED FOR HEADACHE OR MILD PAIN 90 tablet 1  . KLOR-CON M20 20 MEQ tablet TAKE 1 TABLET BY MOUTH TWICE A DAY 180 tablet 3  . lamoTRIgine (LAMICTAL) 200 MG tablet Take 1 tablet (200 mg total) by mouth daily. Patient needs appt. For future refills 90 tablet 0  . metFORMIN (GLUCOPHAGE) 500 MG tablet TAKE 2 TABLETS BY MOUTH TWICE A DAY 360 tablet 3  . sacubitril-valsartan (ENTRESTO) 97-103 MG Take 1 tablet by mouth 2 (two) times daily. 180 tablet 2  . sertraline (ZOLOFT) 100 MG tablet Take two tablets PO QDAY 180 tablet 0  . simvastatin (ZOCOR) 20 MG tablet TAKE 1 TABLET BY MOUTH EVERY DAY 90 tablet 2  . spironolactone (ALDACTONE) 25 MG tablet Take 1 tablet (25 mg total) by mouth daily. 90 tablet 3   No current facility-administered medications for this visit.     Past Medical History:  Diagnosis Date  . ADD (attention  deficit disorder)   . Anxiety   . Arthritis    both knees right worse than left  . Cardiomyopathy, peripartum, postpartum   . Carpal tunnel syndrome of right wrist   . CHF (congestive heart failure) ([place])   . Chronic systolic CHF (congestive heart failure), NYHA class 3 ([place]) 12/06/2013  . Depression   . Dyspnea   . Essential hypertension, benign   . Gallstones   . History of smoking 06/22/2016  . Hyperlipidemia associated with type 2 diabetes mellitus ([place]), on Zocor 04/04/2011  . Hypertension associated with diabetes ([place]) 06/03/2008  . Migraines   . Morbid obesity ([place])   . Morbid obesity with BMI of 50.0-59.9, adult ([place]) 07/12/2006  . NICM (nonischemic cardiomyopathy) ([place]) 07/12/2006   01/16/18 ECHO:    - Procedure narrative: Transthoracic echocardiography. Image   quality was suboptimal. The study was technically difficult.   Intravenous contrast (Definity) was administered. - Left ventricle: The cavity size was moderately dilated. Wall   thickness was increased in a pattern of mild LVH. Systolic   function was moderately to severely reduced. The estimated   ejection fraction w  . Normal coronary arteries 04/17/2016  . OSA (obstructive sleep apnea)   . OSA on CPAP 10/28/2014  . Plantar fasciitis   . Spinal stenosis    back pain  . Spondylosis without myelopathy or radiculopathy, lumbar region 12/04/2017  . Type 2 diabetes mellitus with hyperglycemia [facility])     Past Surgical History:  Procedure Laterality Date  . CHOLECYSTECTOMY    . DILATATION & CURETTAGE/HYSTEROSCOPY WITH MYOSURE N/A 10/31/2017   Procedure: DILATATION & CURETTAGE/HYSTEROSCOPY WITH MYOSURE;  Surgeon: Salvadore Dom, MD;  Location: [facility];  Service: Gynecology;  Laterality: N/A;  . RIGHT/LEFT HEART CATH AND CORONARY ANGIOGRAPHY N/A 04/11/2016   Procedure: Right/Left Heart Cath and Coronary Angiography;  Surgeon: Burnell Blanks, MD;  Location: [facility];  Service: Cardiovascular;  Laterality: N/A;  . sonogram for blood clots     no blockages    ROS:   As stated in the HPI and negative for all other systems.   PHYSICAL EXAM BP 102/66   Pulse 84   Temp (!) 97.1 F (36.2 C)   Ht 5' 3.5" (1.613 m)   Wt (!) 333 lb 6.4 oz (151.2 kg)   SpO2 95%   BMI 58.13 kg/m   GENERAL:  Well appearing NECK:  No jugular venous distention, waveform within normal limits, carotid upstroke brisk and symmetric, no bruits, no thyromegaly LUNGS:  Clear to auscultation bilaterally CHEST:  Unremarkable HEART:  PMI not displaced or sustained,S1 and S2 within normal limits, no S3, no S4, no clicks, no rubs, no murmurs ABD:  Flat, positive bowel sounds normal in frequency in pitch, no bruits, no rebound, no guarding, no midline  pulsatile mass, no hepatomegaly, no splenomegaly EXT:  2 plus pulses throughout, no edema, no cyanosis no clubbing   EKG:   NA   Lab Results  Component Value Date   TSH 3.08 08/25/2018     ASSESSMENT AND PLAN  DYSPNEA -  Her breathing is at baseline.  I am pleased to see an EF is not gotten worse.  No change in therapy.   OBESITY - We talked about this at length in the past.  No change in therapy.  CHRONIC SYSTOLIC HF -  As above.   ESSENTIAL HYPERTENSION, BENIGN -  This is being managed in the context of treating her CHF.    SLEEP  APNEA - She is getting this treated.  I defer to Dr. Claiborne Billings.

## 2019-01-08 ENCOUNTER — Ambulatory Visit (INDEPENDENT_AMBULATORY_CARE_PROVIDER_SITE_OTHER): Payer: Medicare Other | Admitting: Cardiology

## 2019-01-08 ENCOUNTER — Encounter: Payer: Self-pay | Admitting: Cardiology

## 2019-01-08 ENCOUNTER — Other Ambulatory Visit: Payer: Self-pay

## 2019-01-08 VITALS — BP 102/66 | HR 84 | Temp 97.1°F | Ht 63.5 in | Wt 333.4 lb

## 2019-01-08 DIAGNOSIS — I10 Essential (primary) hypertension: Secondary | ICD-10-CM | POA: Diagnosis not present

## 2019-01-08 DIAGNOSIS — Z23 Encounter for immunization: Secondary | ICD-10-CM | POA: Diagnosis not present

## 2019-01-08 DIAGNOSIS — I5022 Chronic systolic (congestive) heart failure: Secondary | ICD-10-CM | POA: Diagnosis not present

## 2019-01-08 NOTE — Patient Instructions (Signed)
Medication Instructions:  NO CHANGE *If you need a refill on your cardiac medications before your next appointment, please call your pharmacy*  Lab Work: If you have labs (blood work) drawn today and your tests are completely normal, you will receive your results only by: [name] MyChart Message (if you have MyChart) OR . A paper copy in the mail If you have any lab test that is abnormal or we need to change your treatment, we will call you to review the results.  Follow-Up: At [facility], you and your health needs are our priority.  As part of our continuing mission to provide you with exceptional heart care, we have created designated Provider Care Teams.  These Care Teams include your primary Cardiologist (physician) and Advanced Practice Providers (APPs -  Physician Assistants and Nurse Practitioners) who all work together to provide you with the care you need, when you need it.  Your next appointment:   6 months  The format for your next appointment:   In Person  Provider:   Minus Breeding, MD  Other Instructions [id number]

## 2019-01-20 ENCOUNTER — Telehealth: Payer: Self-pay

## 2019-01-20 DIAGNOSIS — N6310 Unspecified lump in the right breast, unspecified quadrant: Secondary | ICD-10-CM

## 2019-01-20 NOTE — Telephone Encounter (Signed)
Please contact patient to schedule follow up for right breast imaging. Patient in 04 recall

## 2019-01-25 ENCOUNTER — Other Ambulatory Visit: Payer: Self-pay | Admitting: Obstetrics and Gynecology

## 2019-01-25 DIAGNOSIS — N6310 Unspecified lump in the right breast, unspecified quadrant: Secondary | ICD-10-CM

## 2019-01-25 NOTE — Telephone Encounter (Signed)
Spoke with patient. Patient would like to scheduled follow up. Call to the [facility]. Bilateral diagnostic mammogram with right breast ultrasound scheduled for 02/02/2019 at 9 am. Patient is agreeable to date and time.  Cc: Terence Lux, CMA  Routing to provider and will close encounter.

## 2019-02-01 ENCOUNTER — Telehealth: Payer: Self-pay | Admitting: *Deleted

## 2019-02-01 ENCOUNTER — Ambulatory Visit: Payer: Medicare Other | Admitting: Physician Assistant

## 2019-02-01 NOTE — Telephone Encounter (Signed)
Spoke to pt, c/o vertigo and nausea started on Thursday. Pt said got worse last night had pressure in head, took Ibuprofen no relief. Pt said she feels awful, eyes are sensitive to light does not feel she can make it in here. Told pt need to go to Urgent care of ED. Pt said she is afraid of COVID. Told pt they are being very careful at these places make sure you wear a mask and hand sanitize. Pt verbalized understanding. Asked her if any one can take her? Pt said yes her husband. Told her okay need to have him take you to Urgent care or ED to be evaluated. Pt verbalized understanding.

## 2019-02-01 NOTE — Telephone Encounter (Signed)
Left message on voicemail to call office.  

## 2019-02-02 ENCOUNTER — Other Ambulatory Visit: Payer: Medicare Other

## 2019-02-09 ENCOUNTER — Other Ambulatory Visit: Payer: Self-pay | Admitting: Physician Assistant

## 2019-02-09 DIAGNOSIS — I5022 Chronic systolic (congestive) heart failure: Secondary | ICD-10-CM

## 2019-02-09 NOTE — Telephone Encounter (Signed)
Rx has been sent to the pharmacy electronically. ° °

## 2019-02-12 ENCOUNTER — Other Ambulatory Visit: Payer: Self-pay | Admitting: Family Medicine

## 2019-02-12 ENCOUNTER — Other Ambulatory Visit: Payer: Self-pay | Admitting: Cardiovascular Disease

## 2019-02-12 ENCOUNTER — Other Ambulatory Visit: Payer: Medicare Other

## 2019-02-12 DIAGNOSIS — G8929 Other chronic pain: Secondary | ICD-10-CM

## 2019-02-12 DIAGNOSIS — I5022 Chronic systolic (congestive) heart failure: Secondary | ICD-10-CM

## 2019-02-15 NOTE — Telephone Encounter (Signed)
Rx(s) sent to pharmacy electronically.  

## 2019-02-24 ENCOUNTER — Other Ambulatory Visit: Payer: Medicare Other

## 2019-03-08 ENCOUNTER — Other Ambulatory Visit: Payer: Self-pay | Admitting: Cardiology

## 2019-04-14 IMAGING — US US BREAST*R* LIMITED INC AXILLA
1 series · 11 of 11 positions shown · non-contrast
Comparison: October 10, 2017 and earlier priors

CLINICAL DATA: 52-year-old patient recalled recent screening for
evaluation of a possible right breast mass.

EXAM:
DIGITAL DIAGNOSTIC RIGHT MAMMOGRAM WITH CAD AND TOMO
ULTRASOUND RIGHT BREAST

[Series 1: us breast*right* limited inc axilla · 0.06mm/px · 11 of 11 slices shown]
[im 1/11]
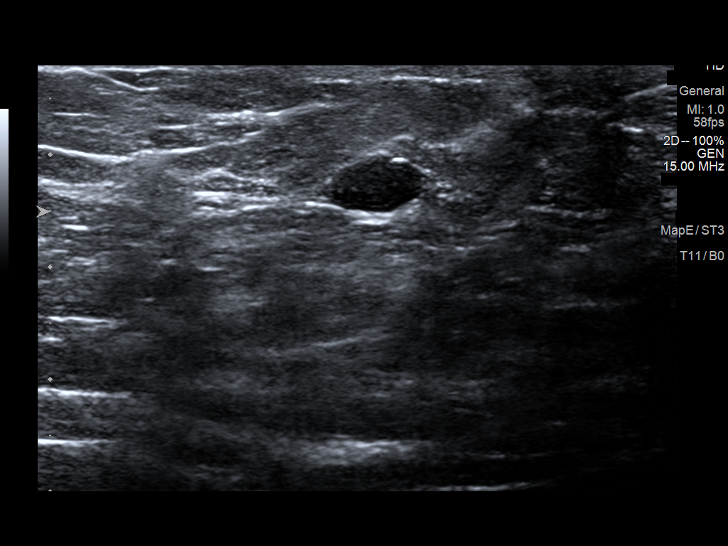
[im 2/11]
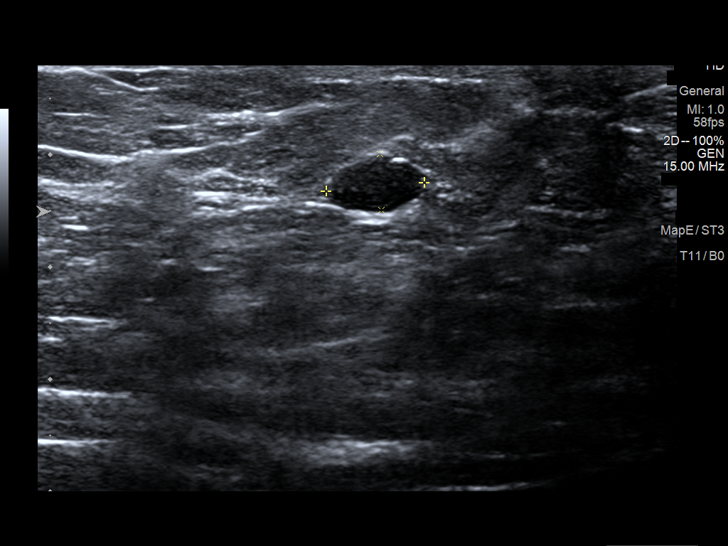
[im 3/11]
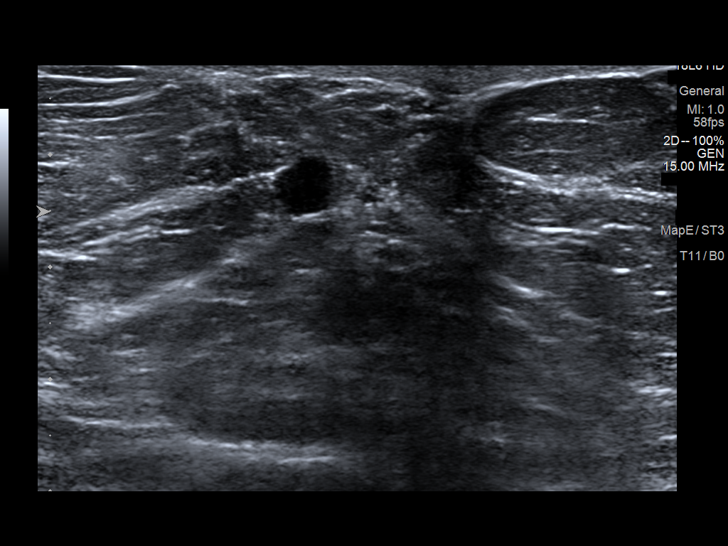
[im 4/11]
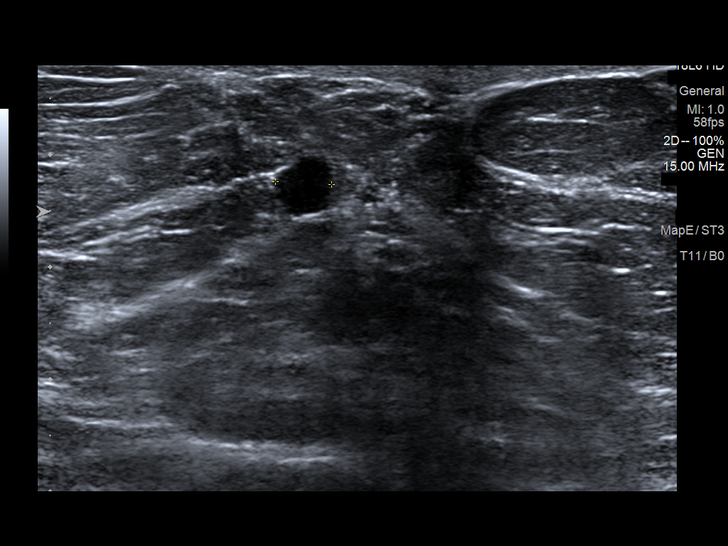
[im 5/11]
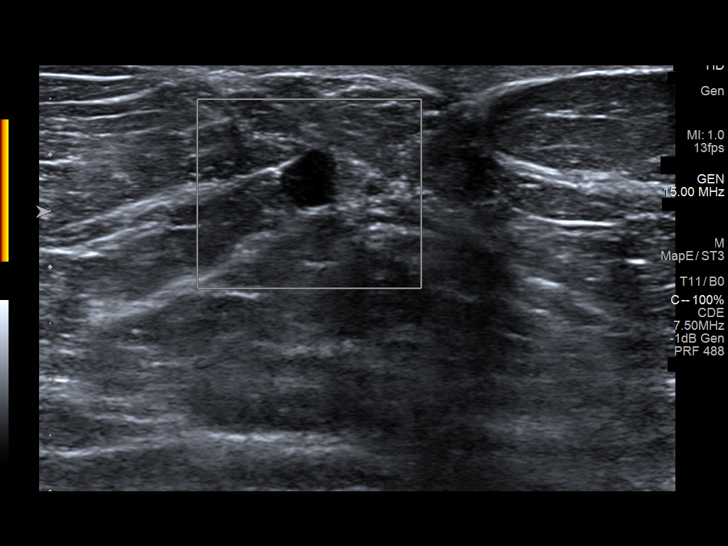
[im 6/11]
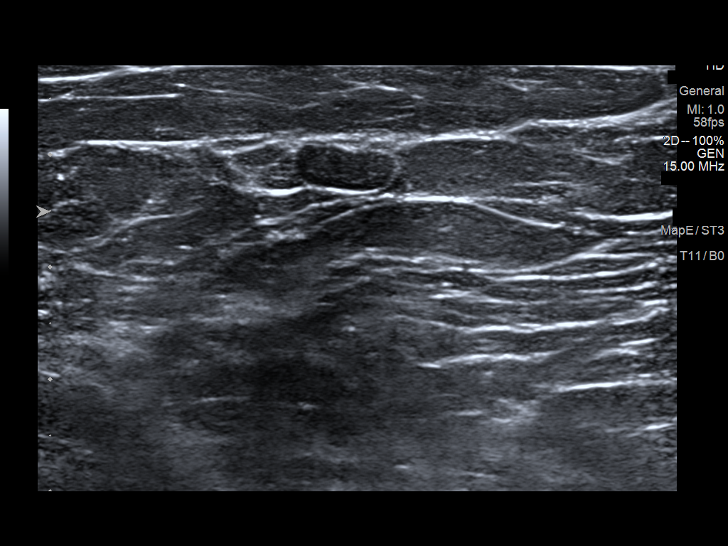
[im 7/11]
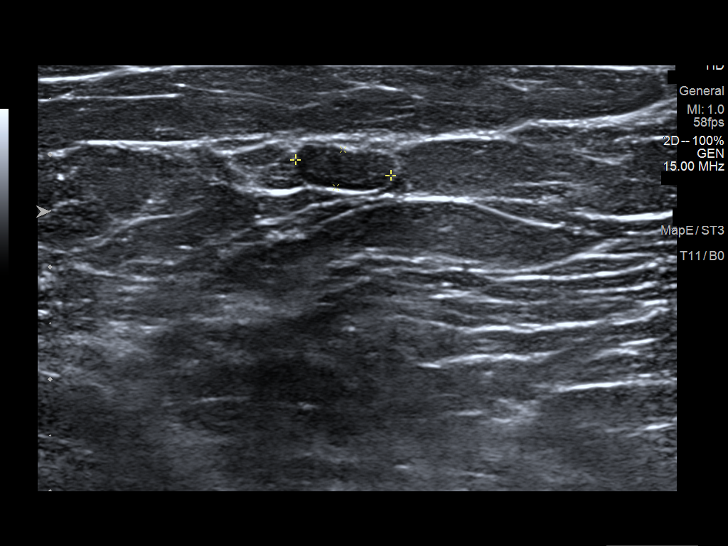
[im 8/11]
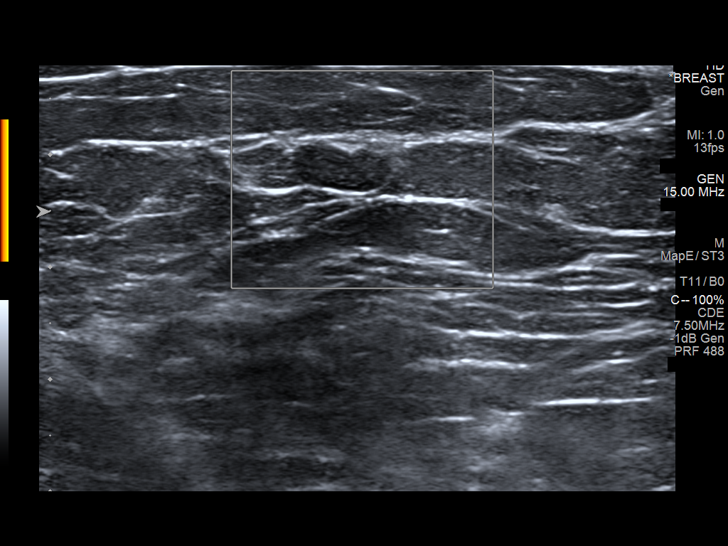
[im 9/11]
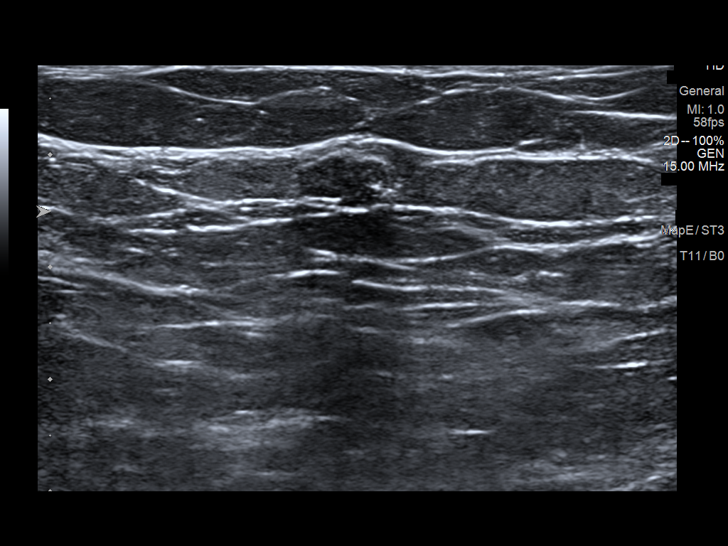
[im 10/11]
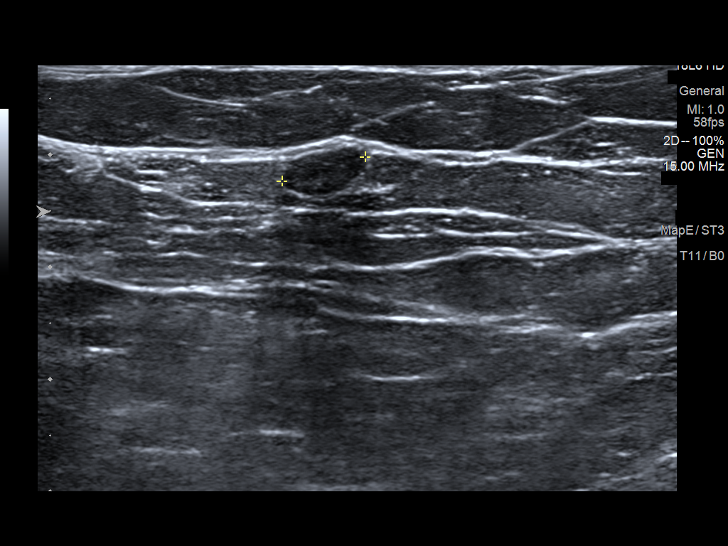
[im 11/11]
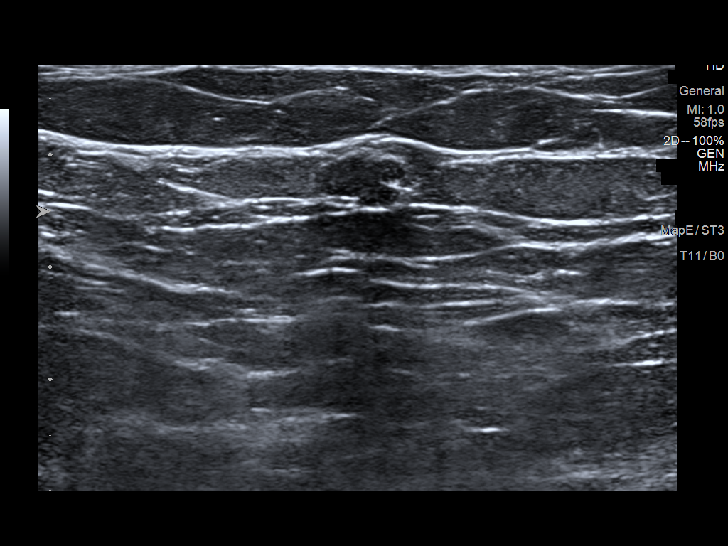

[11 of 11 positions shown; findings below may reference images not displayed]

ACR Breast Density Category b: There are scattered areas of
fibroglandular density.
FINDINGS: Focal spot compression views of the outer right breast confirm 2
subcentimeter circumscribed oval masses.

Mammographic images were processed with CAD.

Targeted ultrasound is performed, showing a 0.9 x 0.5 x 0.5 cm oval
cyst with some internal debris at 9 position 1 cm from nipple.

At 10 o'clock position 4 cm from nipple is a circumscribed oval
parallel hypoechoic mass measuring 0.9 x 0.3 0.8 cm. There is no
associated vascular flow. This mass is probably benign, and favored
to be a fibroadenoma.
IMPRESSION: Probably benign 0.9 cm mass at 10 o'clock position right breast.
This is favored to be a fibroadenoma.

Benign mildly complicated cyst at 9 o'clock position right breast.

RECOMMENDATION:
Right breast ultrasound is recommended in 6 months to follow-up the
probably benign mass at 10 o'clock position. The patient and I
discussed the option of ultrasound-guided biopsy for a probably
benign mass. At this point, she prefers imaging follow-up.

I have discussed the findings and recommendations with the patient.
Results were also provided in writing at the conclusion of the
visit. If applicable, a reminder letter will be sent to the patient
regarding the next appointment.

BI-RADS CATEGORY  3: Probably benign.

## 2019-04-14 IMAGING — MG DIGITAL DIAGNOSTIC UNILATERAL RIGHT MAMMOGRAM WITH TOMO AND CAD
6 series · 6 of 18 positions shown · non-contrast
Comparison: October 10, 2017 and earlier priors

CLINICAL DATA: 52-year-old patient recalled recent screening for
evaluation of a possible right breast mass.

EXAM:
DIGITAL DIAGNOSTIC RIGHT MAMMOGRAM WITH CAD AND TOMO
ULTRASOUND RIGHT BREAST

[R MLO synth-2D]
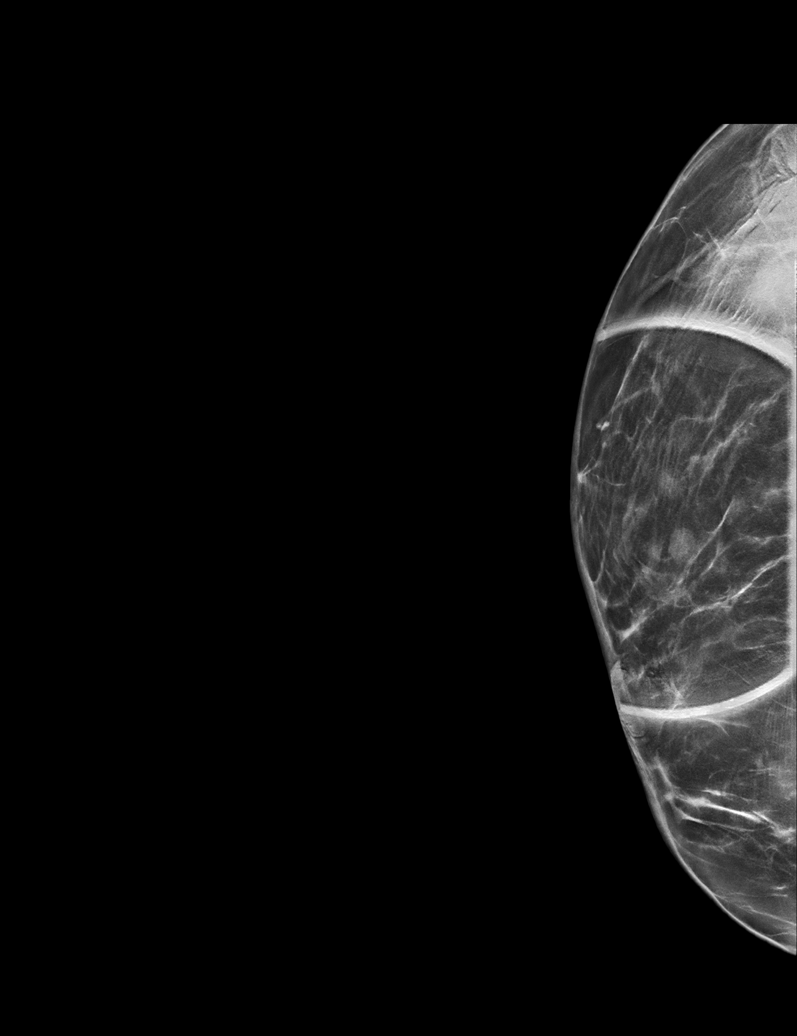

[R CC synth-2D (1 of 2)]
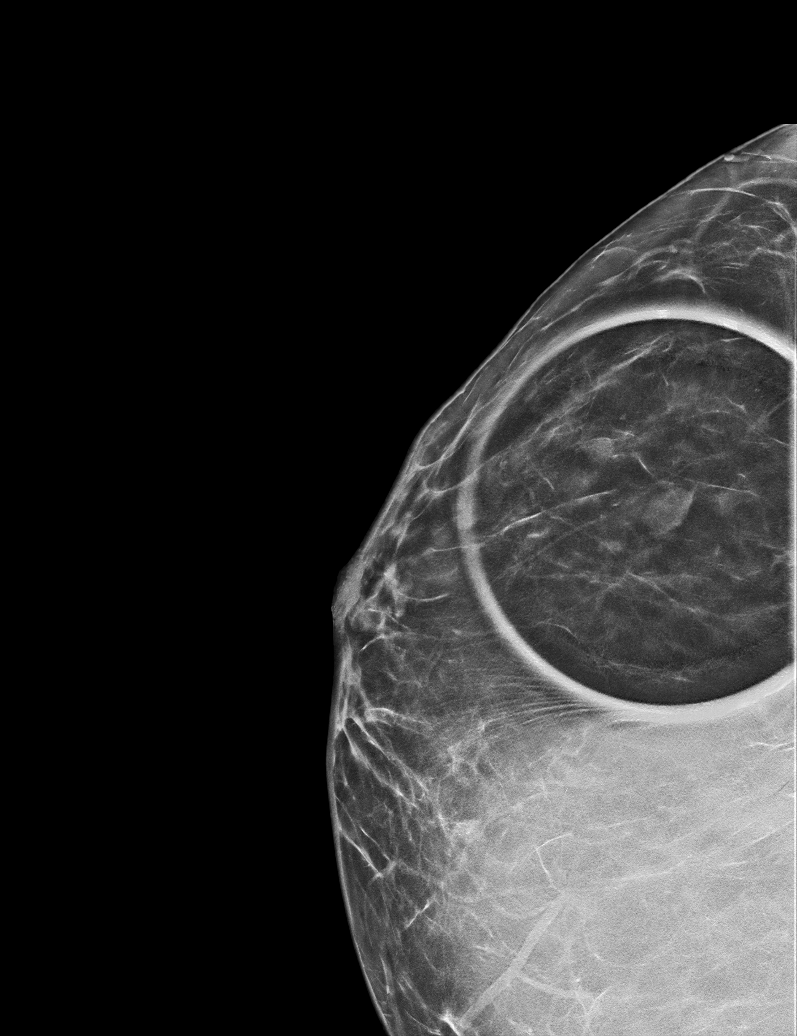

[R CC synth-2D (2 of 2)]
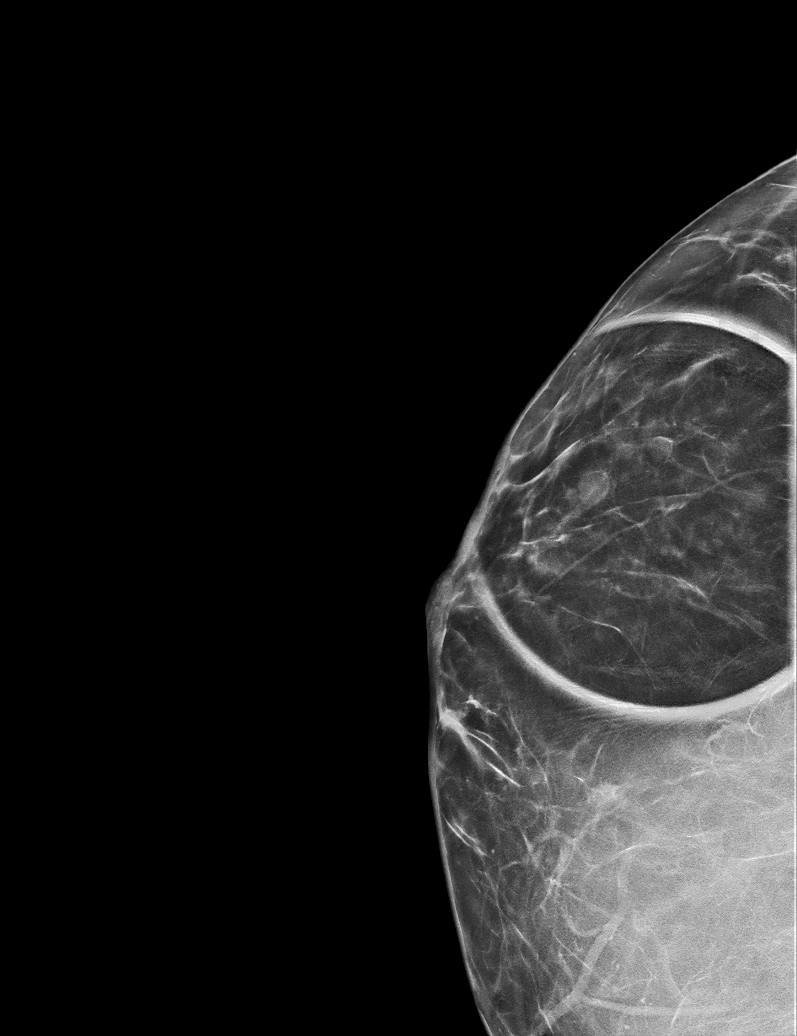

[R CC tomo (1 of 2) · tomo slice 33/64.0]
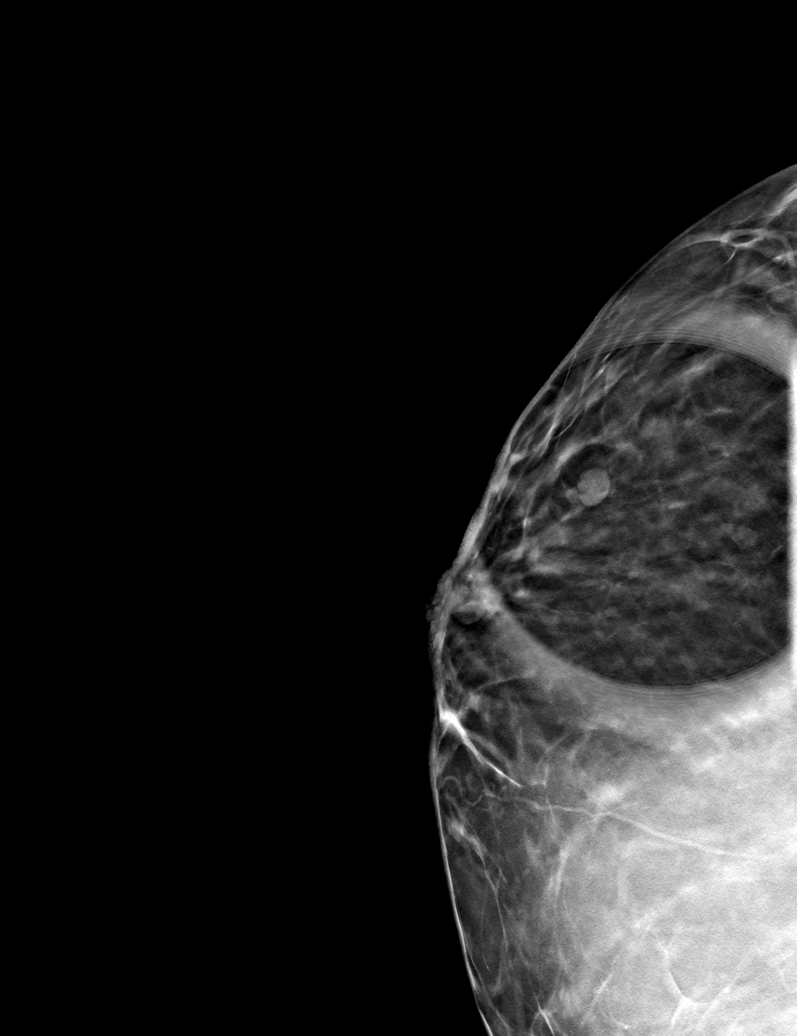

[R MLO tomo · tomo slice 30/59.0]
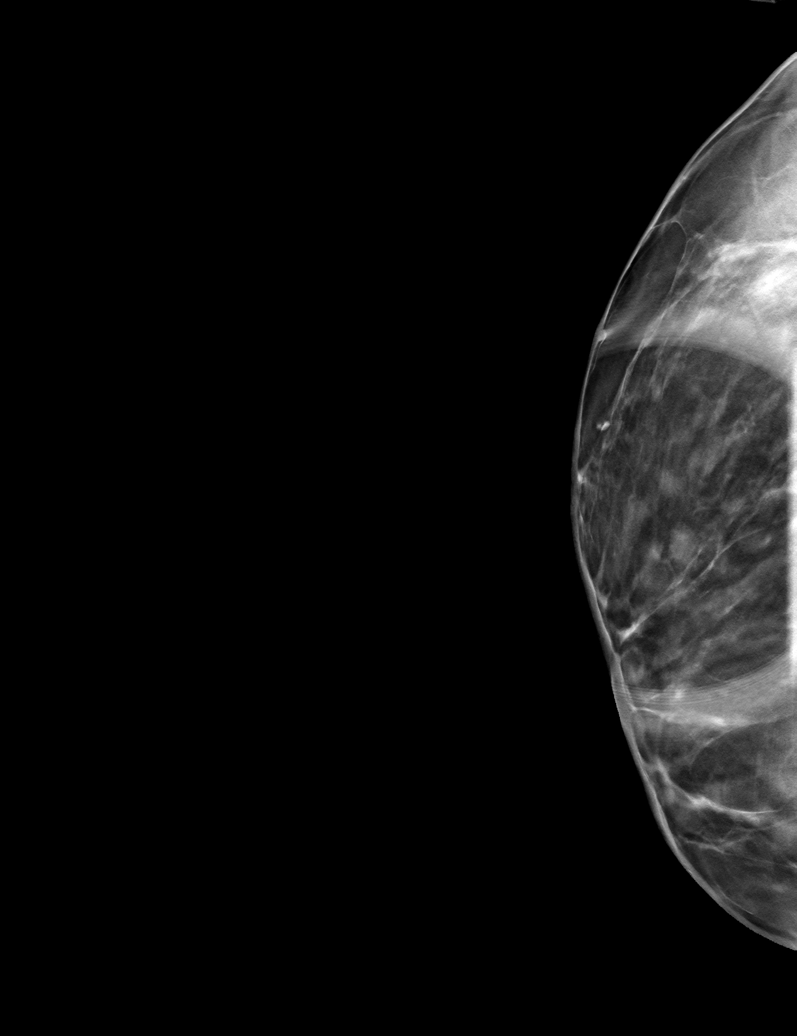

[R CC tomo (2 of 2) · tomo slice 33/65.0]
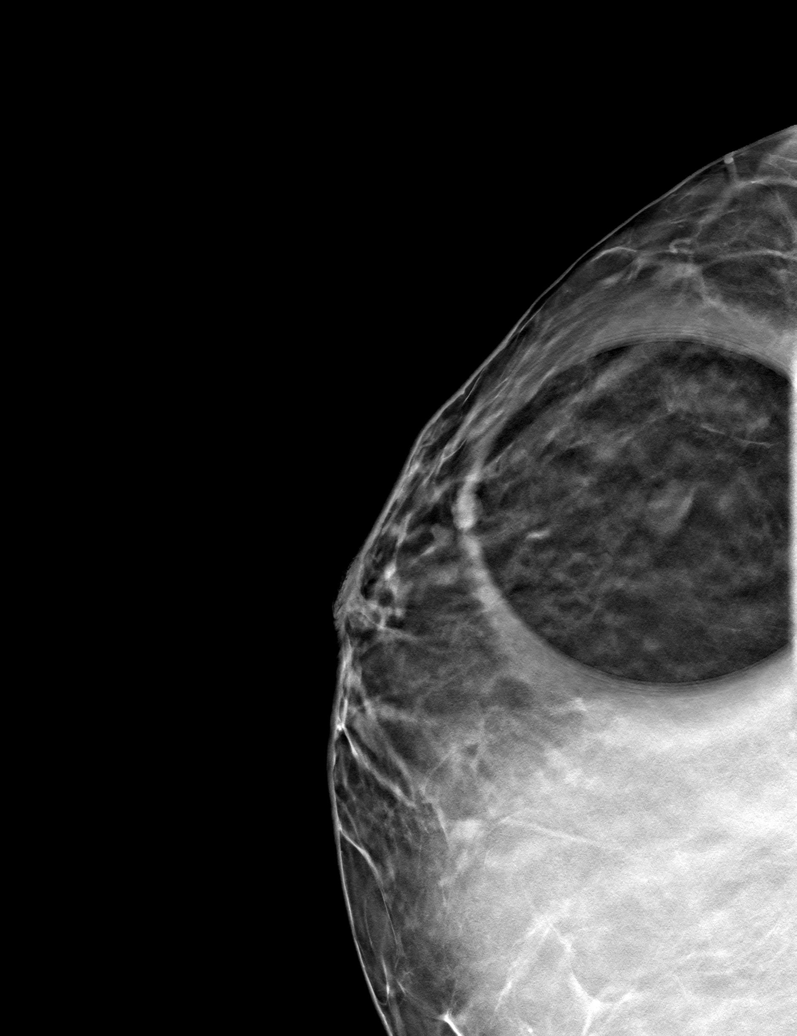

[6 of 18 positions shown; findings below may reference images not displayed]

ACR Breast Density Category b: There are scattered areas of
fibroglandular density.
FINDINGS: Focal spot compression views of the outer right breast confirm 2
subcentimeter circumscribed oval masses.

Mammographic images were processed with CAD.

Targeted ultrasound is performed, showing a 0.9 x 0.5 x 0.5 cm oval
cyst with some internal debris at 9 position 1 cm from nipple.

At 10 o'clock position 4 cm from nipple is a circumscribed oval
parallel hypoechoic mass measuring 0.9 x 0.3 0.8 cm. There is no
associated vascular flow. This mass is probably benign, and favored
to be a fibroadenoma.
IMPRESSION: Probably benign 0.9 cm mass at 10 o'clock position right breast.
This is favored to be a fibroadenoma.

Benign mildly complicated cyst at 9 o'clock position right breast.

RECOMMENDATION:
Right breast ultrasound is recommended in 6 months to follow-up the
probably benign mass at 10 o'clock position. The patient and I
discussed the option of ultrasound-guided biopsy for a probably
benign mass. At this point, she prefers imaging follow-up.

I have discussed the findings and recommendations with the patient.
Results were also provided in writing at the conclusion of the
visit. If applicable, a reminder letter will be sent to the patient
regarding the next appointment.

BI-RADS CATEGORY  3: Probably benign.

## 2019-05-24 ENCOUNTER — Encounter (HOSPITAL_COMMUNITY): Payer: Self-pay | Admitting: Psychiatry

## 2019-05-24 ENCOUNTER — Other Ambulatory Visit: Payer: Self-pay

## 2019-05-24 ENCOUNTER — Ambulatory Visit (INDEPENDENT_AMBULATORY_CARE_PROVIDER_SITE_OTHER): Payer: Medicare Other | Admitting: Psychiatry

## 2019-05-24 DIAGNOSIS — F3341 Major depressive disorder, recurrent, in partial remission: Secondary | ICD-10-CM

## 2019-05-24 MED ORDER — BUSPIRONE HCL 10 MG PO TABS: 10.0000 mg | ORAL_TABLET | Freq: Two times a day (BID) | ORAL | 0 refills | Status: DC

## 2019-05-24 MED ORDER — SERTRALINE HCL 50 MG PO TABS: 50.0000 mg | ORAL_TABLET | Freq: Every day | ORAL | 0 refills | Status: DC

## 2019-05-24 NOTE — Progress Notes (Signed)
BH MD/PA/NP OP Progress Note  05/24/2019 3:45 PM [name]  MRN:  [id number]  Chief Complaint: Patient returns for medication management appointment HPI: 54 year old female.  History of MDD, Anxiety Disorder ( consider GAD).  Of note, patient had not returned for follow-up since August 2020.  As per chart, medications (BuSpar, Lamictal, Zoloft, Klonopin as needed) were last prescribed at that time. Patient reports she has been struggling with increased symptoms, particularly of anxiety.  She attributes this to ongoing stressors.  Her husband is originally from [place] and has a 90 year old child there.  He is currently in [place] and working on trying to have the child come and live with them here in the [place].  She is unsure if and when this will occur.  Her husband is her major support system and so when he is out of the country she finds her self with less support in dealing with daily activities/issues.  She also describes chronic financial difficulties and has been trying to get her landlord to fix their ceiling for a long period of time, without success thus far. Patient has a history of chronic medical illnesses, to include cardiomyopathy, CHF, HTN.  She has an established cardiologist.  She states her PCP recently switched practices so that at this time she does not have an established PCP. Patient reports that in the context of the above stressors as well as related to Covid epidemic she has mainly been staying at home, with limited social interactions. She reports she reschedule appointment as she has been experiencing increased anxiety. Currently does not endorse significant neurovegetative symptoms.  Does report some decreased energy which she acknowledges may be related to underlying medical illness.  Denies SI. As above, patient had not returned for med management since August.  She states, however that she just recently ran out of her psychiatric medications .  States she ran out of  Klonopin, which she was taking at 0.5 mg daily as needed for anxiety, about a week ago.  She has been off BuSpar and lamotrigine for a longer period of time.  Currently she is unsure when she last took Zoloft. She does remember her medications as helpful and well-tolerated.  Does not not remember having had side effects.  Visit Diagnosis: MDD, consider GAD versus Adjustment Disorder  Past Psychiatric History:   Past Medical History:  Past Medical History:  Diagnosis Date  . ADD (attention deficit disorder)   . Anxiety   . Arthritis    both knees right worse than left  . Cardiomyopathy, peripartum, postpartum   . Carpal tunnel syndrome of right wrist   . CHF (congestive heart failure) ([place])   . Chronic systolic CHF (congestive heart failure), NYHA class 3 ([place]) 12/06/2013  . Depression   . Dyspnea   . Essential hypertension, benign   . Gallstones   . History of smoking 06/22/2016  . Hyperlipidemia associated with type 2 diabetes mellitus ([place]), on Zocor 04/04/2011  . Hypertension associated with diabetes ([place]) 06/03/2008  . Migraines   . Morbid obesity ([place])   . Morbid obesity with BMI of 50.0-59.9, adult ([place]) 07/12/2006  . NICM (nonischemic cardiomyopathy) ([place]) 07/12/2006   01/16/18 ECHO:   - Procedure narrative: Transthoracic echocardiography. Image   quality was suboptimal. The study was technically difficult.   Intravenous contrast (Definity) was administered. - Left ventricle: The cavity size was moderately dilated. Wall   thickness was increased in a pattern of mild LVH. Systolic   function was moderately to severely  reduced. The estimated   ejection fraction w  . Normal coronary arteries 04/17/2016  . OSA (obstructive sleep apnea)   . OSA on CPAP 10/28/2014  . Plantar fasciitis   . Spinal stenosis    back pain  . Spondylosis without myelopathy or radiculopathy, lumbar region 12/04/2017  . Type 2 diabetes mellitus with hyperglycemia [facility])     Past Surgical History:  Procedure  Laterality Date  . CHOLECYSTECTOMY    . DILATATION & CURETTAGE/HYSTEROSCOPY WITH MYOSURE N/A 10/31/2017   Procedure: DILATATION & CURETTAGE/HYSTEROSCOPY WITH MYOSURE;  Surgeon: Salvadore Dom, MD;  Location: [facility];  Service: Gynecology;  Laterality: N/A;  . RIGHT/LEFT HEART CATH AND CORONARY ANGIOGRAPHY N/A 04/11/2016   Procedure: Right/Left Heart Cath and Coronary Angiography;  Surgeon: Burnell Blanks, MD;  Location: [facility];  Service: Cardiovascular;  Laterality: N/A;  . sonogram for blood clots     no blockages    Family Psychiatric History:   Family History:  Family History  Problem Relation Age of Onset  . Depression Mother   . Anxiety disorder Mother   . Diabetes Mother   . Hypertension Mother   . ADD / ADHD Father   . Diabetes Father   . Hypertension Father   . Hypertension Other   . Diabetes Other   . Colitis Other   . Alcohol abuse Other     Social History:  Social History   Socioeconomic History  . Marital status: Married    Spouse name: Not on file  . Number of children: Not on file  . Years of education: Not on file  . Highest education level: Not on file  Occupational History  . Not on file  Tobacco Use  . Smoking status: Former Smoker    Packs/day: 0.50    Types: Cigarettes    Quit date: 02/25/1997    Years since quitting: 22.2  . Smokeless tobacco: Never Used  . Tobacco comment: Married, lives with spouse (when he is not traveling) and son  Substance and Sexual Activity  . Alcohol use: No  . Drug use: No  . Sexual activity: Yes    Partners: Male    Birth control/protection: None  Other Topics Concern  . Not on file  Social History Narrative  . Not on file   Social Determinants of Health   Financial Resource Strain:   . Difficulty of Paying Living Expenses:   Food Insecurity:   . Worried About Charity fundraiser in the Last Year:   . Arboriculturist in the Last Year:   Transportation Needs:   . Lexicographer (Medical):   [name] Lack of Transportation (Non-Medical):   Physical Activity:   . Days of Exercise per Week:   . Minutes of Exercise per Session:   Stress:   . Feeling of Stress :   Social Connections:   . Frequency of Communication with Friends and Family:   . Frequency of Social Gatherings with Friends and Family:   . Attends Religious Services:   . Active Member of Clubs or Organizations:   . Attends Archivist Meetings:   [name] Marital Status:     Allergies:  Allergies  Allergen Reactions  . Fluoxetine Other (See Comments)    More depressed  . Cymbalta [Duloxetine Hcl] Other (See Comments)    depressed  . Penicillins     REACTION: rash Has patient had a PCN reaction causing immediate rash, facial/tongue/throat swelling, SOB or  lightheadedness with hypotension: YES Has patient had a PCN reaction causing severe rash involving mucus membranes or skin necrosis: NO Has patient had a PCN reaction that required hospitalization: YES Has patient had a PCN reaction occurring within the last 10 years: NO If all of the above answers are "NO", then may proceed with Cephalosporin use.     Metabolic Disorder Labs: Lab Results  Component Value Date   HGBA1C 6.7 (H) 06/04/2018   MPG 139.85 10/29/2017   MPG 125.5 10/19/2016   MPG 125.5 10/19/2016   No results found for: PROLACTIN Lab Results  Component Value Date   CHOL 204 (H) 07/18/2016   TRIG 123.0 07/18/2016   HDL 51.10 07/18/2016   CHOLHDL 4 07/18/2016   VLDL 24.6 07/18/2016   LDLCALC 128 (H) 07/18/2016   LDLCALC 78 11/08/2013   Lab Results  Component Value Date   TSH 3.08 08/25/2018   TSH 6.53 (H) 06/04/2018    Therapeutic Level Labs: No results found for: LITHIUM No results found for: VALPROATE No components found for:  CBMZ  Current Medications: Current Outpatient Medications  Medication Sig Dispense Refill  . amLODipine (NORVASC) 5 MG tablet Take 1 tablet by mouth daily.     . busPIRone  (BUSPAR) 10 MG tablet Take 1 tablet (10 mg total) by mouth 2 (two) times daily. 60 tablet 0  . carvedilol (COREG) 12.5 MG tablet Take 1 tablet (12.5 mg total) by mouth 2 (two) times daily with a meal. 180 tablet 1  . carvedilol (COREG) 25 MG tablet Take 1 tablet (25 mg total) by mouth 2 (two) times daily with a meal. 180 tablet 1  . clonazePAM (KLONOPIN) 0.5 MG tablet Take 1 tablet (0.5 mg total) by mouth 2 (two) times daily as needed for anxiety. (Patient not taking: Reported on 05/24/2019) 60 tablet 0  . clotrimazole-betamethasone (LOTRISONE) cream APPLY TO AFFECTED AREA TWICE A DAY 45 g 2  . furosemide (LASIX) 40 MG tablet TAKE 1 TABLET BY MOUTH 2 TIMES A DAY AS DIRECTED 180 tablet 3  . ibuprofen (ADVIL) 800 MG tablet TAKE 1 TABLET BY MOUTH EVERY 8 HOURS AS NEEDED FOR HEADACHE OR MILD PAIN 90 tablet 1  . KLOR-CON M20 20 MEQ tablet TAKE 1 TABLET BY MOUTH TWICE A DAY 180 tablet 3  . lamoTRIgine (LAMICTAL) 200 MG tablet Take 1 tablet (200 mg total) by mouth daily. Patient needs appt. For future refills (Patient not taking: Reported on 05/24/2019) 90 tablet 0  . metFORMIN (GLUCOPHAGE) 500 MG tablet TAKE 2 TABLETS BY MOUTH TWICE A DAY 360 tablet 3  . sacubitril-valsartan (ENTRESTO) 97-103 MG Take 1 tablet by mouth 2 (two) times daily. 180 tablet 3  . sertraline (ZOLOFT) 50 MG tablet Take 1 tablet (50 mg total) by mouth daily. 30 tablet 0  . simvastatin (ZOCOR) 20 MG tablet TAKE 1 TABLET BY MOUTH EVERY DAY 90 tablet 2  . spironolactone (ALDACTONE) 25 MG tablet Take 1 tablet (25 mg total) by mouth daily. 90 tablet 3   No current facility-administered medications for this visit.     Musculoskeletal: Strength & Muscle Tone: within normal limits Gait & Station: normal Patient leans: N/A  Psychiatric Specialty Exam: Review of Systems reports shortness of breath on exertion, no chest pain  There were no vitals taken for this visit.There is no height or weight on file to calculate BMI.  General  Appearance: Well Groomed  Eye Contact:  Good  Speech:  Normal Rate  Volume:  Normal  Mood:  Anxious and Depressed  Affect:  Anxious, improves during session, smiles and even laughs appropriately during session  Thought Process:  Linear and Descriptions of Associations: Intact  Orientation:  Full (Time, Place, and Person)  Thought Content: No hallucinations, no delusions   Suicidal Thoughts:  No  Homicidal Thoughts:  No  Memory:  Recent and remote grossly intact  Judgement:  Other:  Present  Insight:  Present  Psychomotor Activity:  Normal  Concentration:  Concentration: Good and Attention Span: Good  Recall:  Good  Fund of Knowledge: Good  Language: Good  Akathisia:  Negative  Handed:  Right  AIMS (if indicated):   Assets:  Communication Skills Desire for Improvement Resilience  ADL's:  Intact  Cognition: WNL  Sleep:  Good   Screenings: AUDIT     Admission (Discharged) from 12/05/2013 in [facility]  Alcohol Use Disorder Identification Test Final Score (AUDIT)  0    PHQ2-9     Office Visit from 11/07/2017 in [facility] Visit from 10/18/2016 in [place] [facility] from 06/26/2016 in [facility] from 07/19/2015 in [facility] Visit from 07/05/2015 in [facility] at [facility]  PHQ-2 Total Score  2  5  4  2   0  PHQ-9 Total Score  11  8  21  5   --       Assessment and Plan:  54 year old female.  History of MDD and anxiety.  Returns for medication management after period of several months, had last been seen in August 2020.  She reports she has been experiencing increased depression and anxiety, primarily the latter.  Attributes to significant/chronic family stressors (husband spending significant amounts of time out of the country, having a son in his country of origin,  currently wanting to bring the son to the [place] for patient and him to continue raising).  As per chart notes, patient was last prescribed medications on 8/11 (90-day supply).  She reports he only recently ran out of Klonopin, which she was taking at 0.5 mg daily as needed for anxiety.  Currently she is not presenting with any symptoms of BZD withdrawal.  She reports she has stopped taking BuSpar, Lamictal and currently is unsure when she stopped Zoloft.  She has a history of good response to these medications without side effects. We discussed options. We will resume Zoloft at 50 mg daily initially, BuSpar at 10 mg twice daily initially.  Patient will also be referred for individual psychotherapy which I think will help provide further support and management with regards to her chronic stressors and coping skills to manage. Patient will continue seeing her cardiologist-as above, reports that she currently does not have an established PCP.  We reviewed the importance of having a PCP she can follow-up with regularly for monitoring and medical management.  She states she will call her cardiologist for referrals.  I will see patient again on 4/12.  She agrees to contact me sooner should there be any worsening or concern prior.     Jenne Campus, MD 05/24/2019, 3:45 PM

## 2019-05-26 DIAGNOSIS — H10413 Chronic giant papillary conjunctivitis, bilateral: Secondary | ICD-10-CM | POA: Diagnosis not present

## 2019-05-26 DIAGNOSIS — H04123 Dry eye syndrome of bilateral lacrimal glands: Secondary | ICD-10-CM | POA: Diagnosis not present

## 2019-05-26 DIAGNOSIS — H524 Presbyopia: Secondary | ICD-10-CM | POA: Diagnosis not present

## 2019-06-07 ENCOUNTER — Ambulatory Visit (HOSPITAL_COMMUNITY): Payer: Medicare Other | Admitting: Psychiatry

## 2019-06-09 ENCOUNTER — Telehealth (HOSPITAL_COMMUNITY): Payer: Self-pay

## 2019-06-09 NOTE — Telephone Encounter (Signed)
Received fax from pharmacy requesting 90 day supply of medications sertraline and buspirone hcl instead of the ordered 30 day supply. If appropriate please send to [facility] on [address].

## 2019-06-24 ENCOUNTER — Ambulatory Visit (HOSPITAL_COMMUNITY): Payer: Medicare Other | Admitting: Psychiatry

## 2019-07-01 ENCOUNTER — Ambulatory Visit: Payer: Medicare Other | Admitting: Cardiology

## 2019-07-19 ENCOUNTER — Encounter: Payer: Self-pay | Admitting: Cardiology

## 2019-09-02 ENCOUNTER — Telehealth: Payer: Self-pay | Admitting: Cardiology

## 2019-09-02 NOTE — Telephone Encounter (Signed)
[name] is calling requesting to speak with Dr. Percival Spanish in regards to the Covid Vaccine. She states she has many questions in regards to it and wants to make 100% sure this is something her family should be doing first. Please advise.

## 2019-09-02 NOTE — Telephone Encounter (Signed)
Spoke with pt, she has cardiomyopathy and she has been waiting to get the vaccine. She recently heard the side affect of myocarditis in young adult men and is very concerned about this with her history. She would like to talk to dr hochrein about her concerns. Aware he is out of the office until Monday but will forward this message to him. Pt agreed with this plan.

## 2019-09-09 ENCOUNTER — Encounter: Payer: Self-pay | Admitting: Cardiology

## 2019-09-09 NOTE — Telephone Encounter (Signed)
I spoke with the patient about Covid vaccine.

## 2019-09-09 NOTE — Progress Notes (Signed)
Cardiology Office Note   Date:  09/10/2019   ID:  [name], DOB 1965-09-21, MRN [id number]  PCP:  Patient, No Pcp Per  Cardiologist:   No primary care provider on file.  Chief Complaint  Patient presents with  . Shortness of Breath      History of Present Illness: [name] is a 54 y.o. female who initially seen by me in 2016 for initial sleep clinic evaluation after initiation of CPAP therapy for obstructive sleep apnea. She had cath 04/11/16. This revealed normal coronaries, EF 25-30%, mild pulmonary HTN, and elevated LVEDP.  She was in the hospital in August 2018.  She had acute on chronic systolic and diastolic HF. She presents for follow up.   EF 12/2017 was 30 - 35%.  Her most recent echo demonstrated the EF to be 50 - 55%.      She unfortunately has gotten more depressed.  She is not been doing any activity.  She has been eating more.  She put on weight.  She is having lots of back pain and joint problems.  This is really limiting her ability to do anything.  She is having more shortness of breath.  She describes shortness of breath walking short distance on level ground.  She is not describing new PND or orthopnea.  She is not having any new chest pressure, neck or arm discomfort.  Her lower extremity swelling comes and goes.  Past Medical History:  Diagnosis Date  . ADD (attention deficit disorder)   . Anxiety   . Arthritis    both knees right worse than left  . Carpal tunnel syndrome of right wrist   . Depression   . Essential hypertension, benign   . Gallstones   . History of smoking 06/22/2016  . Hyperlipidemia associated with type 2 diabetes mellitus ([place]), on Zocor 04/04/2011  . Hypertension associated with diabetes ([place]) 06/03/2008  . Migraines   . Morbid obesity ([place])   . Morbid obesity with BMI of 50.0-59.9, adult ([place]) 07/12/2006  . NICM (nonischemic cardiomyopathy) ([place]) 07/12/2006   01/16/18 ECHO:   - Procedure narrative: Transthoracic echocardiography.  Image   quality was suboptimal. The study was technically difficult.   Intravenous contrast (Definity) was administered. - Left ventricle: The cavity size was moderately dilated. Wall   thickness was increased in a pattern of mild LVH. Systolic   function was moderately to severely reduced. The estimated   ejection fraction w  . Normal coronary arteries 04/17/2016  . OSA on CPAP 10/28/2014  . Spinal stenosis    back pain  . Spondylosis without myelopathy or radiculopathy, lumbar region 12/04/2017  . Type 2 diabetes mellitus with hyperglycemia [facility])     Past Surgical History:  Procedure Laterality Date  . CHOLECYSTECTOMY    . DILATATION & CURETTAGE/HYSTEROSCOPY WITH MYOSURE N/A 10/31/2017   Procedure: DILATATION & CURETTAGE/HYSTEROSCOPY WITH MYOSURE;  Surgeon: Salvadore Dom, MD;  Location: [facility];  Service: Gynecology;  Laterality: N/A;  . RIGHT/LEFT HEART CATH AND CORONARY ANGIOGRAPHY N/A 04/11/2016   Procedure: Right/Left Heart Cath and Coronary Angiography;  Surgeon: Burnell Blanks, MD;  Location: [facility];  Service: Cardiovascular;  Laterality: N/A;  . sonogram for blood clots     no blockages     Current Outpatient Medications  Medication Sig Dispense Refill  . amLODipine (NORVASC) 5 MG tablet Take 1 tablet by mouth daily.     . busPIRone (BUSPAR) 10 MG tablet Take 1 tablet (10  mg total) by mouth 2 (two) times daily. 60 tablet 0  . carvedilol (COREG) 12.5 MG tablet Take 1 tablet (12.5 mg total) by mouth 2 (two) times daily with a meal. 180 tablet 1  . carvedilol (COREG) 25 MG tablet Take 1 tablet (25 mg total) by mouth 2 (two) times daily with a meal. 180 tablet 1  . clotrimazole-betamethasone (LOTRISONE) cream APPLY TO AFFECTED AREA TWICE A DAY 45 g 2  . furosemide (LASIX) 40 MG tablet TAKE 1 TABLET BY MOUTH 2 TIMES A DAY AS DIRECTED 180 tablet 3  . ibuprofen (ADVIL) 800 MG tablet TAKE 1 TABLET BY MOUTH EVERY 8 HOURS AS NEEDED FOR HEADACHE OR MILD PAIN 90 tablet 1   . KLOR-CON M20 20 MEQ tablet TAKE 1 TABLET BY MOUTH TWICE A DAY 180 tablet 3  . lamoTRIgine (LAMICTAL) 200 MG tablet Take 1 tablet (200 mg total) by mouth daily. Patient needs appt. For future refills 90 tablet 0  . metFORMIN (GLUCOPHAGE) 500 MG tablet TAKE 2 TABLETS BY MOUTH TWICE A DAY 360 tablet 3  . sacubitril-valsartan (ENTRESTO) 97-103 MG Take 1 tablet by mouth 2 (two) times daily. 180 tablet 3  . sertraline (ZOLOFT) 50 MG tablet Take 1 tablet (50 mg total) by mouth daily. 30 tablet 0  . simvastatin (ZOCOR) 20 MG tablet TAKE 1 TABLET BY MOUTH EVERY DAY 90 tablet 2  . spironolactone (ALDACTONE) 25 MG tablet Take 1 tablet (25 mg total) by mouth daily. 90 tablet 3   No current facility-administered medications for this visit.    Allergies:   Fluoxetine, Cymbalta [duloxetine hcl], and Penicillins    ROS:  Please see the history of present illness.   Otherwise, review of systems are positive for none.   All other systems are reviewed and negative.    PHYSICAL EXAM: VS:  BP 130/85   Pulse 77   Temp (!) 96.8 F (36 C)   Ht 5\' 4"  (1.626 m)   Wt (!) 349 lb (158.3 kg)   SpO2 96%   BMI 59.91 kg/m  , BMI Body mass index is 59.91 kg/m. GENERAL:  Well appearing NECK:  No jugular venous distention, waveform within normal limits, carotid upstroke brisk and symmetric, no bruits, no thyromegaly LUNGS:  Clear to auscultation bilaterally CHEST:  Unremarkable HEART:  PMI not displaced or sustained,S1 and S2 within normal limits, no S3, no S4, no clicks, no rubs, no murmurs ABD:  Flat, positive bowel sounds normal in frequency in pitch, no bruits, no rebound, no guarding, no midline pulsatile mass, no hepatomegaly, no splenomegaly EXT:  2 plus pulses throughout, no edema, no cyanosis no clubbing   EKG:  EKG is ordered today. The ekg ordered today demonstrates sinus rhythm, rate 77, axis within normal limits, intervals within normal limits, no acute ST-T wave changes.   Recent Labs: No  results found for requested labs within last 8760 hours.    Lipid Panel    Component Value Date/Time   CHOL 204 (H) 07/18/2016 1125   TRIG 123.0 07/18/2016 1125   HDL 51.10 07/18/2016 1125   CHOLHDL 4 07/18/2016 1125   VLDL 24.6 07/18/2016 1125   LDLCALC 128 (H) 07/18/2016 1125   LDLDIRECT 143.8 04/03/2011 0929      Wt Readings from Last 3 Encounters:  09/10/19 (!) 349 lb (158.3 kg)  01/08/19 (!) 333 lb 6.4 oz (151.2 kg)  10/13/18 (!) 329 lb (149.2 kg)      Other studies Reviewed: Additional studies/ records that  were reviewed today include: None. Review of the above records demonstrates:  NA   ASSESSMENT AND PLAN:  DYSPNEA -  I think her shortness of breath is probably multifactorial.  Her ejection fraction actually was better in November.  I am going to go ahead and give her an extra 40 mg of Lasix in the morning for a week along with 20 mEq of potassium.  I get a basic metabolic profile and a BNP today.  She needs to be seen back in the clinic in about a month.   CHRONIC SYSTOLIC HF -  Her ejection fraction was improved as above.  She is on excellent medical regimen.  No change in therapy other than above.  ESSENTIAL HYPERTENSION, BENIGN -  Her blood pressure is controlled.  No change in therapy.  SLEEP APNEA - She uses CPAP.  Current medicines are reviewed at length with the patient today.  The patient does not have concerns regarding medicines.  The following changes have been made:  As above  Labs/ tests ordered today include:   Orders Placed This Encounter  Procedures  . Basic metabolic panel  . Brain natriuretic peptide  . EKG 12-Lead     Disposition:   FU with me in one month.     Signed, Minus Breeding, MD  09/10/2019 3:00 PM    [place]

## 2019-09-10 ENCOUNTER — Other Ambulatory Visit: Payer: Self-pay

## 2019-09-10 ENCOUNTER — Ambulatory Visit (INDEPENDENT_AMBULATORY_CARE_PROVIDER_SITE_OTHER): Payer: Medicare Other | Admitting: Cardiology

## 2019-09-10 ENCOUNTER — Encounter: Payer: Self-pay | Admitting: Cardiology

## 2019-09-10 VITALS — BP 130/85 | HR 77 | Temp 96.8°F | Ht 64.0 in | Wt 349.0 lb

## 2019-09-10 DIAGNOSIS — I5042 Chronic combined systolic (congestive) and diastolic (congestive) heart failure: Secondary | ICD-10-CM

## 2019-09-10 DIAGNOSIS — R0602 Shortness of breath: Secondary | ICD-10-CM

## 2019-09-10 DIAGNOSIS — I10 Essential (primary) hypertension: Secondary | ICD-10-CM | POA: Diagnosis not present

## 2019-09-10 NOTE — Patient Instructions (Signed)
Medication Instructions:  Take Lasix 80 mg in the morning, and 40 mg in the evening for the next 1 week. When you do this take extra 20 meq of potassium.   *If you need a refill on your cardiac medications before your next appointment, please call your pharmacy*   Lab Work: BNP, BMET today   If you have labs (blood work) drawn today and your tests are completely normal, you will receive your results only by: [name] MyChart Message (if you have MyChart) OR . A paper copy in the mail If you have any lab test that is abnormal or we need to change your treatment, we will call you to review the results.   Follow-Up: At [facility], you and your health needs are our priority.  As part of our continuing mission to provide you with exceptional heart care, we have created designated Provider Care Teams.  These Care Teams include your primary Cardiologist (physician) and Advanced Practice Providers (APPs -  Physician Assistants and Nurse Practitioners) who all work together to provide you with the care you need, when you need it.  We recommend signing up for the patient portal called "MyChart".  Sign up information is provided on this After Visit Summary.  MyChart is used to connect with patients for Virtual Visits (Telemedicine).  Patients are able to view lab/test results, encounter notes, upcoming appointments, etc.  Non-urgent messages can be sent to your provider as well.   To learn more about what you can do with MyChart, go to [email or web address].    Your next appointment:   1 month(s)  The format for your next appointment:   In Person  Provider:   Minus Breeding, MD   Other Instructions For PCP information please call [phone].

## 2019-09-11 LAB — BASIC METABOLIC PANEL
BUN/Creatinine Ratio: 17 (ref 9–23)
BUN: 17 mg/dL (ref 6–24)
CO2: 24 mmol/L (ref 20–29)
Calcium: 9.6 mg/dL (ref 8.7–10.2)
Chloride: 97 mmol/L (ref 96–106)
Creatinine, Ser: 1 mg/dL (ref 0.57–1.00)
GFR calc Af Amer: 74 mL/min/{1.73_m2} (ref >59)
GFR calc non Af Amer: 64 mL/min/{1.73_m2} (ref >59)
Glucose: 96 mg/dL (ref 65–99)
Potassium: 5.2 mmol/L (ref 3.5–5.2)
Sodium: 140 mmol/L (ref 134–144)

## 2019-09-11 LAB — BRAIN NATRIURETIC PEPTIDE: BNP: 54.2 pg/mL (ref 0.0–100.0)

## 2019-10-13 NOTE — Progress Notes (Signed)
Cardiology Office Note   Date:  10/14/2019   ID:  [name], DOB 1966-01-24, MRN [id number]  PCP:  Patient, No Pcp Per  Cardiologist:   No primary care provider on file.  Chief Complaint  Patient presents with  . Shortness of Breath      History of Present Illness: [name] is a 54 y.o. female who initially seen by me in 2016 for initial sleep clinic evaluation after initiation of CPAP therapy for obstructive sleep apnea. She had cath 04/11/16. This revealed normal coronaries, EF 25-30%, mild pulmonary HTN, and elevated LVEDP.  She was in the hospital in August 2018.  She had acute on chronic systolic and diastolic HF. She presents for follow up.   EF 12/2017 was 30 - 35%.  Her most recent echo demonstrated the EF to be 50 - 55%.      At the last visit she had increased dyspnea and I treated her with increased diuretic.    Her BNP was not elevated.  She did not get better with 7 days of increased diuretic.  Today she returns.  Her weight is down a couple pounds.  Is up about 23 pounds compared to her low in November.  She is very tearful today.  She is clearly depressed.  She does not have follow-up with her psychiatrist and currently is not seeing a therapist.  She reports dyspnea with mild exertion.  She is not describing PND or orthopnea.  Her lower extremity swelling comes and goes.  She drinks a fair amount of fluid but she seems to be reasonably good with salt.  I do think she eats prepared foods.  She is unable to be active because of joint pains and back pains and her shortness of breath.  She is not having any new chest pressure, neck or arm discomfort.  Past Medical History:  Diagnosis Date  . ADD (attention deficit disorder)   . Anxiety   . Arthritis    both knees right worse than left  . Carpal tunnel syndrome of right wrist   . Depression   . Essential hypertension, benign   . Gallstones   . History of smoking 06/22/2016  . Hyperlipidemia associated with type 2  diabetes mellitus ([place]), on Zocor 04/04/2011  . Hypertension associated with diabetes ([place]) 06/03/2008  . Migraines   . Morbid obesity ([place])   . Morbid obesity with BMI of 50.0-59.9, adult ([place]) 07/12/2006  . NICM (nonischemic cardiomyopathy) ([place]) 07/12/2006   01/16/18 ECHO:   - Procedure narrative: Transthoracic echocardiography. Image   quality was suboptimal. The study was technically difficult.   Intravenous contrast (Definity) was administered. - Left ventricle: The cavity size was moderately dilated. Wall   thickness was increased in a pattern of mild LVH. Systolic   function was moderately to severely reduced. The estimated   ejection fraction w  . Normal coronary arteries 04/17/2016  . OSA on CPAP 10/28/2014  . Spinal stenosis    back pain  . Spondylosis without myelopathy or radiculopathy, lumbar region 12/04/2017  . Type 2 diabetes mellitus with hyperglycemia [facility])     Past Surgical History:  Procedure Laterality Date  . CHOLECYSTECTOMY    . DILATATION & CURETTAGE/HYSTEROSCOPY WITH MYOSURE N/A 10/31/2017   Procedure: DILATATION & CURETTAGE/HYSTEROSCOPY WITH MYOSURE;  Surgeon: Salvadore Dom, MD;  Location: [facility];  Service: Gynecology;  Laterality: N/A;  . RIGHT/LEFT HEART CATH AND CORONARY ANGIOGRAPHY N/A 04/11/2016   Procedure: Right/Left Heart Cath and Coronary  Angiography;  Surgeon: Burnell Blanks, MD;  Location: [facility];  Service: Cardiovascular;  Laterality: N/A;  . sonogram for blood clots     no blockages     Current Outpatient Medications  Medication Sig Dispense Refill  . amLODipine (NORVASC) 5 MG tablet Take 1 tablet by mouth daily.     . busPIRone (BUSPAR) 10 MG tablet Take 1 tablet (10 mg total) by mouth 2 (two) times daily. 60 tablet 0  . carvedilol (COREG) 12.5 MG tablet Take 1 tablet (12.5 mg total) by mouth 2 (two) times daily with a meal. 180 tablet 1  . carvedilol (COREG) 25 MG tablet Take 1 tablet (25 mg total) by mouth 2 (two) times daily  with a meal. 180 tablet 1  . clotrimazole-betamethasone (LOTRISONE) cream APPLY TO AFFECTED AREA TWICE A DAY 45 g 2  . furosemide (LASIX) 80 MG tablet Take 1 tablet (80 mg total) by mouth 2 (two) times daily. 60 tablet 11  . ibuprofen (ADVIL) 800 MG tablet TAKE 1 TABLET BY MOUTH EVERY 8 HOURS AS NEEDED FOR HEADACHE OR MILD PAIN 90 tablet 1  . lamoTRIgine (LAMICTAL) 200 MG tablet Take 1 tablet (200 mg total) by mouth daily. Patient needs appt. For future refills 90 tablet 0  . metFORMIN (GLUCOPHAGE) 500 MG tablet TAKE 2 TABLETS BY MOUTH TWICE A DAY 360 tablet 3  . potassium chloride SA (KLOR-CON M20) 20 MEQ tablet Take 2 tablets (40 mEq total) by mouth daily. 180 tablet 3  . sacubitril-valsartan (ENTRESTO) 97-103 MG Take 1 tablet by mouth 2 (two) times daily. 180 tablet 3  . sertraline (ZOLOFT) 50 MG tablet Take 1 tablet (50 mg total) by mouth daily. 30 tablet 0  . simvastatin (ZOCOR) 20 MG tablet TAKE 1 TABLET BY MOUTH EVERY DAY 90 tablet 2  . spironolactone (ALDACTONE) 25 MG tablet Take 1 tablet (25 mg total) by mouth daily. 90 tablet 3   No current facility-administered medications for this visit.    Allergies:   Fluoxetine, Cymbalta [duloxetine hcl], and Penicillins    ROS:  Please see the history of present illness.   Otherwise, review of systems are positive for none.   All other systems are reviewed and negative.    PHYSICAL EXAM: VS:  BP (!) 140/100   Pulse 91   Temp (!) 96.8 F (36 C)   Ht 5\' 4"  (1.626 m)   Wt (!) 347 lb (157.4 kg)   SpO2 94%   BMI 59.56 kg/m  , BMI Body mass index is 59.56 kg/m. GENERAL:  Well appearing NECK:  No jugular venous distention, waveform within normal limits, carotid upstroke brisk and symmetric, no bruits, no thyromegaly LUNGS:  Clear to auscultation bilaterally CHEST:  Unremarkable HEART:  PMI not displaced or sustained,S1 and S2 within normal limits, no S3, no S4, no clicks, no rubs, no murmurs ABD:  Flat, positive bowel sounds normal in  frequency in pitch, no bruits, no rebound, no guarding, no midline pulsatile mass, no hepatomegaly, no splenomegaly EXT:  2 plus pulses throughout, mild bilateral lower extremity edema, no cyanosis no clubbing   EKG:  EKG is not ordered today.    Recent Labs: 09/10/2019: BNP 54.2; BUN 17; Creatinine, Ser 1.00; Potassium 5.2; Sodium 140    Lipid Panel    Component Value Date/Time   CHOL 204 (H) 07/18/2016 1125   TRIG 123.0 07/18/2016 1125   HDL 51.10 07/18/2016 1125   CHOLHDL 4 07/18/2016 1125   VLDL 24.6  07/18/2016 1125   LDLCALC 128 (H) 07/18/2016 1125   LDLDIRECT 143.8 04/03/2011 0929      Wt Readings from Last 3 Encounters:  10/14/19 (!) 347 lb (157.4 kg)  09/10/19 (!) 349 lb (158.3 kg)  01/08/19 (!) 333 lb 6.4 oz (151.2 kg)      Other studies Reviewed: Additional studies/ records that were reviewed today include: Labs Review of the above records demonstrates:  See elsewhere   ASSESSMENT AND PLAN:  DYSPNEA -  I do think this is multifactorial.  Her BNP was normal but it can be falsely low in patients with obesity.  I do think there is some element of chronic and acute on chronic systolic and diastolic heart failure.  Her ejection fraction was improved in November.  She is on a reasonable medical therapy.  I am going to increase her Lasix to 80 mg twice a day and her potassium to 40 mEq daily and check a basic metabolic profile in 5 days.  I did review pulmonary function testing and cardiopulmonary stress testing done a few years ago and there was no evidence of asthma or other lung disease.  It was felt that a lot of her symptoms were related to weight and deconditioning.   CHRONIC SYSTOLIC HF -  Her ejection fraction was improved as above.  This will be managed as above.   ESSENTIAL HYPERTENSION, BENIGN -  Her blood pressure is elevated but this is unusual.  She can keep a blood pressure diary and if she continues to be elevated at the next visit we might have to  further titrate her medications.   SLEEP APNEA - She uses CPAP.    DEPRESSION - This is clearly not controlled well.  I told her she needs to get follow-up with her psychiatrist and be referred again to a therapist.  COVID EDUCATION:   She has had the first of her vaccines.   Current medicines are reviewed at length with the patient today.  The patient does not have concerns regarding medicines.  The following changes have been made:  As above  Labs/ tests ordered today include:   Orders Placed This Encounter  Procedures  . DG Chest 2 View  . Basic metabolic panel     Disposition:   FU with an APP or me in one month.     Signed, Minus Breeding, MD  10/14/2019 10:31 AM    [facility]

## 2019-10-14 ENCOUNTER — Ambulatory Visit (INDEPENDENT_AMBULATORY_CARE_PROVIDER_SITE_OTHER): Payer: Medicare Other | Admitting: Cardiology

## 2019-10-14 ENCOUNTER — Encounter: Payer: Self-pay | Admitting: Cardiology

## 2019-10-14 ENCOUNTER — Other Ambulatory Visit: Payer: Self-pay

## 2019-10-14 VITALS — BP 140/100 | HR 91 | Temp 96.8°F | Ht 64.0 in | Wt 347.0 lb

## 2019-10-14 DIAGNOSIS — R0602 Shortness of breath: Secondary | ICD-10-CM | POA: Diagnosis not present

## 2019-10-14 DIAGNOSIS — I5022 Chronic systolic (congestive) heart failure: Secondary | ICD-10-CM | POA: Diagnosis not present

## 2019-10-14 MED ORDER — FUROSEMIDE 80 MG PO TABS: 80.0000 mg | ORAL_TABLET | Freq: Two times a day (BID) | ORAL | 11 refills | Status: DC

## 2019-10-14 MED ORDER — POTASSIUM CHLORIDE CRYS ER 20 MEQ PO TBCR: 40.0000 meq | EXTENDED_RELEASE_TABLET | Freq: Every day | ORAL | 3 refills | Status: DC

## 2019-10-14 NOTE — Patient Instructions (Addendum)
Medication Instructions:  Start Lasix 80mg  BID Start 47meq of Potassium daily *If you need a refill on your cardiac medications before your next appointment, please call your pharmacy*  Lab Work: Your physician recommends that you return for lab work in: 5 days (BMP) If you have labs (blood work) drawn today and your tests are completely normal, you will receive your results only by: [name] MyChart Message (if you have MyChart) OR . A paper copy in the mail If you have any lab test that is abnormal or we need to change your treatment, we will call you to review the results.  Testing/Procedures: A chest x-ray takes a picture of the organs and structures inside the chest, including the heart, lungs, and blood vessels. This test can show several things, including, whether the heart is enlarges; whether fluid is building up in the lungs; and whether pacemaker / defibrillator leads are still in place. [facility]  Follow-Up: At [facility], you and your health needs are our priority.  As part of our continuing mission to provide you with exceptional heart care, we have created designated Provider Care Teams.  These Care Teams include your primary Cardiologist (physician) and Advanced Practice Providers (APPs -  Physician Assistants and Nurse Practitioners) who all work together to provide you with the care you need, when you need it.  We recommend signing up for the patient portal called "MyChart".  Sign up information is provided on this After Visit Summary.  MyChart is used to connect with patients for Virtual Visits (Telemedicine).  Patients are able to view lab/test results, encounter notes, upcoming appointments, etc.  Non-urgent messages can be sent to your provider as well.   To learn more about what you can do with MyChart, go to [email or web address].    Your next appointment:   Thursday 11/18/19 at 9:45am  The format for your next appointment:   In  Person  Provider:   Almyra Deforest, PA

## 2019-11-15 ENCOUNTER — Telehealth: Payer: Self-pay | Admitting: Cardiology

## 2019-11-15 NOTE — Telephone Encounter (Signed)
Spoke with patient who reports she has already discussed with Dr. Percival Spanish the need for a primary care provider who can help manage her medical conditions - she states he was thinking of a name of someone, waiting on a call back from them, but she has not heard back from our office. She did not want a general recommendation of a [place] practice, for example, but instead someone Dr. Percival Spanish knows/recommends personally.

## 2019-11-15 NOTE — Telephone Encounter (Signed)
New Message:    Pt saud Dr Percival Spanish was supposed to give a name of a good primary care doctor.

## 2019-11-18 ENCOUNTER — Ambulatory Visit: Payer: Medicare Other | Admitting: Physician Assistant

## 2019-11-18 NOTE — Telephone Encounter (Signed)
I have sent out 3 or 4 requests to PCPs and either had no response or have had "no" as a response.  I don't have any tricks up my sleeve.  She is likely to have better luck by picking names close to her or in town and working down the list to see who is taking new patients.  Sorry.  I tried.

## 2019-11-18 NOTE — Telephone Encounter (Signed)
Returned call to patient with MD advice. Advised she can search the [employer] for PCPs taking new patients and they may often have bios to read. Suggested she reach out to friends to find out if they have good rapport with any providers.

## 2019-11-29 ENCOUNTER — Telehealth: Payer: Self-pay | Admitting: General Practice

## 2019-11-29 ENCOUNTER — Ambulatory Visit (INDEPENDENT_AMBULATORY_CARE_PROVIDER_SITE_OTHER): Payer: Medicare Other | Admitting: Psychiatry

## 2019-11-29 ENCOUNTER — Other Ambulatory Visit: Payer: Self-pay

## 2019-11-29 ENCOUNTER — Encounter (HOSPITAL_COMMUNITY): Payer: Self-pay | Admitting: Psychiatry

## 2019-11-29 DIAGNOSIS — F331 Major depressive disorder, recurrent, moderate: Secondary | ICD-10-CM | POA: Diagnosis not present

## 2019-11-29 MED ORDER — BUSPIRONE HCL 10 MG PO TABS: 10.0000 mg | ORAL_TABLET | Freq: Two times a day (BID) | ORAL | 0 refills | Status: DC

## 2019-11-29 MED ORDER — SERTRALINE HCL 50 MG PO TABS: 50.0000 mg | ORAL_TABLET | Freq: Every day | ORAL | 0 refills | Status: DC

## 2019-11-29 NOTE — Telephone Encounter (Signed)
Dr. Parke Poisson called the office and was trying to get the pt established with a PCP and was wondering if you would be willing to accept. Please advise.

## 2019-11-29 NOTE — Progress Notes (Signed)
BH MD/PA/NP OP Progress Note  11/29/2019 1:17 PM [name]  MRN:  [id number]  Chief Complaint: Patient returns for medication management appointment HPI: 54 year old female.  History of MDD, Anxiety Disorder ( consider GAD).  Of note, patient had not returned for follow-up since March/2021. She reports she has been off her psychiatric medications for several months. She reports persistent symptoms of depression, anxiety, and describes neuro-vegetative symptoms of depression to include a subjective sense of anhedonia, low energy level . Attributes anxiety ( and depression) in part to persistent dyspnea , which she describes as exertional. She describes becoming dyspneic when walking more than several steps or when doing any activities more strenuous than above. Of note, she follows with her outpatient cardiologist, Dr. Percival Spanish. Dyspnea not felt to be related to CHF- As noted - 10/14/19 visit note indicates that her EF has actually improved from 25% in 2018, to 35% in 2019, to 55 % most recently. Brain Natriuretic Peptide was 54.2 on 09/10/19.  ( She does not endorse NPD or Orthopnea. Also does not report peripheral edema at this time)Dr. Hochrein's note also indicates patient was experiencing increased depression. She acknowledges sedentary lifestyle with little physical activity. August /21 BMI 59.5  She reports her personal life is " about the same"- her son is in college but still lives with her, her husband and her have an amicable relationship, but there is chronic tension as he has another child ( out of wedlock) out of the country. She has agreed for him to have this child live with them, but states that Visa/legal requirements are complicated and lengthy.  No suicidal ideations , and presents future oriented, focused on getting established with a PCP for ongoing medical monitoring and management.  States her Cardiologist has advised her to get a PCP but has had difficulty in doing so in part "  because I want to see someone who I know is good".  As noted , reports she has been off her psychiatric medications for several months . I had last seen her on 05/24/2019 ( at which time presentation was similar) . At the time we had resumed Zoloft 50 mgrs QDAY and Buspar 10 mgrs BID, which she had been on previously without side effects.     Visit Diagnosis: MDD, consider GAD versus Adjustment Disorder  Past Psychiatric History:   Past Medical History:  Past Medical History:  Diagnosis Date  . ADD (attention deficit disorder)   . Anxiety   . Arthritis    both knees right worse than left  . Carpal tunnel syndrome of right wrist   . Depression   . Essential hypertension, benign   . Gallstones   . History of smoking 06/22/2016  . Hyperlipidemia associated with type 2 diabetes mellitus ([place]), on Zocor 04/04/2011  . Hypertension associated with diabetes ([place]) 06/03/2008  . Migraines   . Morbid obesity ([place])   . Morbid obesity with BMI of 50.0-59.9, adult ([place]) 07/12/2006  . NICM (nonischemic cardiomyopathy) ([place]) 07/12/2006   01/16/18 ECHO:   - Procedure narrative: Transthoracic echocardiography. Image   quality was suboptimal. The study was technically difficult.   Intravenous contrast (Definity) was administered. - Left ventricle: The cavity size was moderately dilated. Wall   thickness was increased in a pattern of mild LVH. Systolic   function was moderately to severely reduced. The estimated   ejection fraction w  . Normal coronary arteries 04/17/2016  . OSA on CPAP 10/28/2014  . Spinal stenosis  back pain  . Spondylosis without myelopathy or radiculopathy, lumbar region 12/04/2017  . Type 2 diabetes mellitus with hyperglycemia [facility])     Past Surgical History:  Procedure Laterality Date  . CHOLECYSTECTOMY    . DILATATION & CURETTAGE/HYSTEROSCOPY WITH MYOSURE N/A 10/31/2017   Procedure: DILATATION & CURETTAGE/HYSTEROSCOPY WITH MYOSURE;  Surgeon: Salvadore Dom, MD;  Location: [place]  [facility];  Service: Gynecology;  Laterality: N/A;  . RIGHT/LEFT HEART CATH AND CORONARY ANGIOGRAPHY N/A 04/11/2016   Procedure: Right/Left Heart Cath and Coronary Angiography;  Surgeon: Burnell Blanks, MD;  Location: [place] [facility];  Service: Cardiovascular;  Laterality: N/A;  . sonogram for blood clots     no blockages    Family Psychiatric History:   Family History:  Family History  Problem Relation Age of Onset  . Depression Mother   . Anxiety disorder Mother   . Diabetes Mother   . Hypertension Mother   . ADD / ADHD Father   . Diabetes Father   . Hypertension Father   . Hypertension Other   . Diabetes Other   . Colitis Other   . Alcohol abuse Other     Social History:  Social History   Socioeconomic History  . Marital status: Married    Spouse name: Not on file  . Number of children: Not on file  . Years of education: Not on file  . Highest education level: Not on file  Occupational History  . Not on file  Tobacco Use  . Smoking status: Former Smoker    Packs/day: 0.50    Types: Cigarettes    Quit date: 02/25/1997    Years since quitting: 22.7  . Smokeless tobacco: Never Used  . Tobacco comment: Married, lives with spouse (when he is not traveling) and son  Vaping Use  . Vaping Use: Never used  Substance and Sexual Activity  . Alcohol use: No  . Drug use: No  . Sexual activity: Yes    Partners: Male    Birth control/protection: None  Other Topics Concern  . Not on file  Social History Narrative  . Not on file   Social Determinants of Health   Financial Resource Strain:   . Difficulty of Paying Living Expenses: Not on file  Food Insecurity:   . Worried About Charity fundraiser in the Last Year: Not on file  . Ran Out of Food in the Last Year: Not on file  Transportation Needs:   . Lack of Transportation (Medical): Not on file  . Lack of Transportation (Non-Medical): Not on file  Physical Activity:   . Days of Exercise per Week: Not on file   . Minutes of Exercise per Session: Not on file  Stress:   . Feeling of Stress : Not on file  Social Connections:   . Frequency of Communication with Friends and Family: Not on file  . Frequency of Social Gatherings with Friends and Family: Not on file  . Attends Religious Services: Not on file  . Active Member of Clubs or Organizations: Not on file  . Attends Archivist Meetings: Not on file  . Marital Status: Not on file    Allergies:  Allergies  Allergen Reactions  . Fluoxetine Other (See Comments)    More depressed  . Cymbalta [Duloxetine Hcl] Other (See Comments)    depressed  . Penicillins     REACTION: rash Has patient had a PCN reaction causing immediate rash, facial/tongue/throat swelling, SOB or lightheadedness with  hypotension: YES Has patient had a PCN reaction causing severe rash involving mucus membranes or skin necrosis: NO Has patient had a PCN reaction that required hospitalization: YES Has patient had a PCN reaction occurring within the last 10 years: NO If all of the above answers are "NO", then may proceed with Cephalosporin use.     Metabolic Disorder Labs: Lab Results  Component Value Date   HGBA1C 6.7 (H) 06/04/2018   MPG 139.85 10/29/2017   MPG 125.5 10/19/2016   MPG 125.5 10/19/2016   No results found for: PROLACTIN Lab Results  Component Value Date   CHOL 204 (H) 07/18/2016   TRIG 123.0 07/18/2016   HDL 51.10 07/18/2016   CHOLHDL 4 07/18/2016   VLDL 24.6 07/18/2016   LDLCALC 128 (H) 07/18/2016   LDLCALC 78 11/08/2013   Lab Results  Component Value Date   TSH 3.08 08/25/2018   TSH 6.53 (H) 06/04/2018    Therapeutic Level Labs: No results found for: LITHIUM No results found for: VALPROATE No components found for:  CBMZ  Current Medications: Current Outpatient Medications  Medication Sig Dispense Refill  . amLODipine (NORVASC) 5 MG tablet Take 1 tablet by mouth daily.     . busPIRone (BUSPAR) 10 MG tablet Take 1 tablet  (10 mg total) by mouth 2 (two) times daily. 60 tablet 0  . carvedilol (COREG) 12.5 MG tablet Take 1 tablet (12.5 mg total) by mouth 2 (two) times daily with a meal. 180 tablet 1  . carvedilol (COREG) 25 MG tablet Take 1 tablet (25 mg total) by mouth 2 (two) times daily with a meal. 180 tablet 1  . clotrimazole-betamethasone (LOTRISONE) cream APPLY TO AFFECTED AREA TWICE A DAY 45 g 2  . furosemide (LASIX) 80 MG tablet Take 1 tablet (80 mg total) by mouth 2 (two) times daily. 60 tablet 11  . ibuprofen (ADVIL) 800 MG tablet TAKE 1 TABLET BY MOUTH EVERY 8 HOURS AS NEEDED FOR HEADACHE OR MILD PAIN 90 tablet 1  . lamoTRIgine (LAMICTAL) 200 MG tablet Take 1 tablet (200 mg total) by mouth daily. Patient needs appt. For future refills 90 tablet 0  . metFORMIN (GLUCOPHAGE) 500 MG tablet TAKE 2 TABLETS BY MOUTH TWICE A DAY 360 tablet 3  . potassium chloride SA (KLOR-CON M20) 20 MEQ tablet Take 2 tablets (40 mEq total) by mouth daily. 180 tablet 3  . sacubitril-valsartan (ENTRESTO) 97-103 MG Take 1 tablet by mouth 2 (two) times daily. 180 tablet 3  . sertraline (ZOLOFT) 50 MG tablet Take 1 tablet (50 mg total) by mouth daily. 30 tablet 0  . simvastatin (ZOCOR) 20 MG tablet TAKE 1 TABLET BY MOUTH EVERY DAY 90 tablet 2  . spironolactone (ALDACTONE) 25 MG tablet Take 1 tablet (25 mg total) by mouth daily. 90 tablet 3   No current facility-administered medications for this visit.     Musculoskeletal: Strength & Muscle Tone: within normal limits Gait & Station: normal Patient leans: N/A  Psychiatric Specialty Exam: Review of Systems reports shortness of breath on exertion, no NPD, no Orthopnea, no peripheral edema  There were no vitals taken for this visit.There is no height or weight on file to calculate BMI.  General Appearance: Well Groomed  Eye Contact:  Good  Speech:  Normal Rate  Volume:  Normal  Mood:  reports feeling depressed  Affect:  constricted, but improves partially during session, smiles  at times appropriately during session  Thought Process:  Linear and Descriptions of Associations: Intact  Orientation:  Full (Time, Place, and Person)  Thought Content: No hallucinations, no delusions   Suicidal Thoughts:  No - no suicidal ideations and presents future oriented  Homicidal Thoughts:  No  Memory:  Recent and remote grossly intact  Judgement:  Fair  Insight:  Fair  Psychomotor Activity:  Normal  Concentration:  Concentration: Good and Attention Span: Good  Recall:  Good  Fund of Knowledge: Good  Language: Good  Akathisia:  Negative  Handed:  Right  AIMS (if indicated):   Assets:  Communication Skills Desire for Improvement Resilience  ADL's:  Intact  Cognition: WNL  Sleep:  Good   Screenings: AUDIT     Admission (Discharged) from 12/05/2013 in [place] [facility]  Alcohol Use Disorder Identification Test Final Score (AUDIT) 0    PHQ2-9     Office Visit from 11/07/2017 in [place] Visit from 10/18/2016 in [place] from 06/26/2016 in [place] from 07/19/2015 in [facility] Visit from 07/05/2015 in [place] at [facility]  PHQ-2 Total Score 2 5 4 2  0  PHQ-9 Total Score 11 8 21 5  --       Assessment and Plan:  54 year old female.  History of MDD and anxiety.  Returns for medication management - had last been seen in March /2021. She reports she has not been taking psychiatric medications for several months.  She reports ongoing depression, associated with a sense of anhedonia, decreased energy level. No SI and no psychotic symptoms noted or endorsed .  Attributes depression in part to persistent dyspnea, which she describes as exertional . For this she continues to follow with her cardiologist, who has noted that her EF has tended to improve over  recent years , most recently 50-55 %.   As noted, weight ( BMI 59.5) may be contributing to described pulmonary symptoms.  We reviewed options. She is interested in resuming antidepressant medication management. I have also encouraged her to start individual psychotherapy, but currently she declines referral . She is interested in referral to [place] to establish care with PCP, and I have at her request called [place] clinic in order to initiate referral process .  We reviewed importance of taking her psychiatric medications consistently and that there is a lag between medication initiation and antidepressant /antianxiety response, as well as importance of regular outpatient follow ups .   We will resume Zoloft at 50 mg daily initially, BuSpar at 10 mg twice daily initially.  Will see in about 3 weeks for follow up- she agrees to contact clinic sooner if any worsening prior.        Jenne Campus, MD 11/29/2019, 1:17 PM

## 2020-01-04 NOTE — Telephone Encounter (Signed)
Called patient and left a voicemail to give the office a call to get a new pt appointment.

## 2020-01-09 ENCOUNTER — Other Ambulatory Visit: Payer: Self-pay | Admitting: Family Medicine

## 2020-01-09 DIAGNOSIS — G8929 Other chronic pain: Secondary | ICD-10-CM

## 2020-01-10 ENCOUNTER — Other Ambulatory Visit: Payer: Self-pay

## 2020-01-10 ENCOUNTER — Encounter (HOSPITAL_COMMUNITY): Payer: Self-pay | Admitting: Psychiatry

## 2020-01-10 ENCOUNTER — Ambulatory Visit (INDEPENDENT_AMBULATORY_CARE_PROVIDER_SITE_OTHER): Payer: Medicare Other | Admitting: Psychiatry

## 2020-01-10 DIAGNOSIS — F331 Major depressive disorder, recurrent, moderate: Secondary | ICD-10-CM

## 2020-01-10 MED ORDER — SERTRALINE HCL 100 MG PO TABS: 100.0000 mg | ORAL_TABLET | Freq: Every day | ORAL | 1 refills | Status: DC

## 2020-01-10 MED ORDER — BUSPIRONE HCL 15 MG PO TABS: 15.0000 mg | ORAL_TABLET | Freq: Two times a day (BID) | ORAL | 1 refills | Status: DC

## 2020-01-10 NOTE — Progress Notes (Signed)
BH MD/PA/NP OP Progress Note  01/10/2020 2:40 PM [name]  MRN:  [id number]  Chief Complaint: Patient returns for medication management appointment HPI: 54 year old female.  History of MDD, Anxiety Disorder ( consider GAD).   [name] reports she has restarted psychiatric medications and is currently them regularly ( Zoloft 50 mgr QDAY and Buspar at 10 mgr BID-* she had been on these meds in the past and they were restarted on her most recent visit ( 11/29/2019) . Reports she continues to feel anxious at times. Mood is partially improved but still depressed . Lingering neuro-vegetative symptoms include low energy level and some anhedonia. No SI, no psychotic symptoms. Anxiety is related in part to dyspnea, which has been chronic but which she feels has worsened overtime ( describes exertional dyspnea, for which she follows with cardiologist.  Also has history of OSA), but also feels she tends to worry excessively beyond above physical / medical concerns . She is noted to experience some dyspnea/ shortness of breath as she walks from waiting room to office. No shortness of breath noted while sitting /at rest .  Her marital relationship is described as improved and currently stable. She worries , however, as husband has in the past travelled overseas to see family and another son he has without informing her . States " it's like I call him on the phone and if he does not pick up right away I feel panicked ".  She states her son is doing well ( first year of college ) , and is making good grades. She worries about social aspect of college experience.   Denies medication side effects. She is interested in starting Klonopin , which she had taken in the past for anxiety . Have reviewed rationale to avoid /minimize use of BZD at this time and potential adverse impact on OSA related to sedating medications .   She reports she had been motivated to make an initial appointment with her new PCP at [facility]  , but had misplaced phone so has not done so. At her request I helped her make the appointment , which is scheduled for later this week. We have reviewed importance of establishing/maintaining  PCP care .    Visit Diagnosis: MDD, consider GAD versus Adjustment Disorder  Past Psychiatric History:   Past Medical History:  Past Medical History:  Diagnosis Date  . ADD (attention deficit disorder)   . Anxiety   . Arthritis    both knees right worse than left  . Carpal tunnel syndrome of right wrist   . Depression   . Essential hypertension, benign   . Gallstones   . History of smoking 06/22/2016  . Hyperlipidemia associated with type 2 diabetes mellitus ([place]), on Zocor 04/04/2011  . Hypertension associated with diabetes ([place]) 06/03/2008  . Migraines   . Morbid obesity ([place])   . Morbid obesity with BMI of 50.0-59.9, adult ([place]) 07/12/2006  . NICM (nonischemic cardiomyopathy) ([place]) 07/12/2006   01/16/18 ECHO:   - Procedure narrative: Transthoracic echocardiography. Image   quality was suboptimal. The study was technically difficult.   Intravenous contrast (Definity) was administered. - Left ventricle: The cavity size was moderately dilated. Wall   thickness was increased in a pattern of mild LVH. Systolic   function was moderately to severely reduced. The estimated   ejection fraction w  . Normal coronary arteries 04/17/2016  . OSA on CPAP 10/28/2014  . Spinal stenosis    back pain  . Spondylosis without myelopathy  or radiculopathy, lumbar region 12/04/2017  . Type 2 diabetes mellitus with hyperglycemia [facility])     Past Surgical History:  Procedure Laterality Date  . CHOLECYSTECTOMY    . DILATATION & CURETTAGE/HYSTEROSCOPY WITH MYOSURE N/A 10/31/2017   Procedure: DILATATION & CURETTAGE/HYSTEROSCOPY WITH MYOSURE;  Surgeon: Salvadore Dom, MD;  Location: [place] [facility];  Service: Gynecology;  Laterality: N/A;  . RIGHT/LEFT HEART CATH AND CORONARY ANGIOGRAPHY N/A 04/11/2016   Procedure: Right/Left Heart  Cath and Coronary Angiography;  Surgeon: Burnell Blanks, MD;  Location: [place] [facility];  Service: Cardiovascular;  Laterality: N/A;  . sonogram for blood clots     no blockages    Family Psychiatric History:   Family History:  Family History  Problem Relation Age of Onset  . Depression Mother   . Anxiety disorder Mother   . Diabetes Mother   . Hypertension Mother   . ADD / ADHD Father   . Diabetes Father   . Hypertension Father   . Hypertension Other   . Diabetes Other   . Colitis Other   . Alcohol abuse Other     Social History:  Social History   Socioeconomic History  . Marital status: Married    Spouse name: Not on file  . Number of children: Not on file  . Years of education: Not on file  . Highest education level: Not on file  Occupational History  . Not on file  Tobacco Use  . Smoking status: Former Smoker    Packs/day: 0.50    Types: Cigarettes    Quit date: 02/25/1997    Years since quitting: 22.8  . Smokeless tobacco: Never Used  . Tobacco comment: Married, lives with spouse (when he is not traveling) and son  Vaping Use  . Vaping Use: Never used  Substance and Sexual Activity  . Alcohol use: No  . Drug use: No  . Sexual activity: Yes    Partners: Male    Birth control/protection: None  Other Topics Concern  . Not on file  Social History Narrative  . Not on file   Social Determinants of Health   Financial Resource Strain:   . Difficulty of Paying Living Expenses: Not on file  Food Insecurity:   . Worried About Charity fundraiser in the Last Year: Not on file  . Ran Out of Food in the Last Year: Not on file  Transportation Needs:   . Lack of Transportation (Medical): Not on file  . Lack of Transportation (Non-Medical): Not on file  Physical Activity:   . Days of Exercise per Week: Not on file  . Minutes of Exercise per Session: Not on file  Stress:   . Feeling of Stress : Not on file  Social Connections:   . Frequency of  Communication with Friends and Family: Not on file  . Frequency of Social Gatherings with Friends and Family: Not on file  . Attends Religious Services: Not on file  . Active Member of Clubs or Organizations: Not on file  . Attends Archivist Meetings: Not on file  . Marital Status: Not on file    Allergies:  Allergies  Allergen Reactions  . Fluoxetine Other (See Comments)    More depressed  . Cymbalta [Duloxetine Hcl] Other (See Comments)    depressed  . Penicillins     REACTION: rash Has patient had a PCN reaction causing immediate rash, facial/tongue/throat swelling, SOB or lightheadedness with hypotension: YES Has patient had a PCN  reaction causing severe rash involving mucus membranes or skin necrosis: NO Has patient had a PCN reaction that required hospitalization: YES Has patient had a PCN reaction occurring within the last 10 years: NO If all of the above answers are "NO", then may proceed with Cephalosporin use.     Metabolic Disorder Labs: Lab Results  Component Value Date   HGBA1C 6.7 (H) 06/04/2018   MPG 139.85 10/29/2017   MPG 125.5 10/19/2016   MPG 125.5 10/19/2016   No results found for: PROLACTIN Lab Results  Component Value Date   CHOL 204 (H) 07/18/2016   TRIG 123.0 07/18/2016   HDL 51.10 07/18/2016   CHOLHDL 4 07/18/2016   VLDL 24.6 07/18/2016   LDLCALC 128 (H) 07/18/2016   LDLCALC 78 11/08/2013   Lab Results  Component Value Date   TSH 3.08 08/25/2018   TSH 6.53 (H) 06/04/2018    Therapeutic Level Labs: No results found for: LITHIUM No results found for: VALPROATE No components found for:  CBMZ  Current Medications: Current Outpatient Medications  Medication Sig Dispense Refill  . amLODipine (NORVASC) 5 MG tablet Take 1 tablet by mouth daily.     . busPIRone (BUSPAR) 10 MG tablet Take 1 tablet (10 mg total) by mouth 2 (two) times daily. 60 tablet 0  . carvedilol (COREG) 12.5 MG tablet Take 1 tablet (12.5 mg total) by mouth 2  (two) times daily with a meal. 180 tablet 1  . carvedilol (COREG) 25 MG tablet Take 1 tablet (25 mg total) by mouth 2 (two) times daily with a meal. 180 tablet 1  . clotrimazole-betamethasone (LOTRISONE) cream APPLY TO AFFECTED AREA TWICE A DAY 45 g 2  . furosemide (LASIX) 80 MG tablet Take 1 tablet (80 mg total) by mouth 2 (two) times daily. 60 tablet 11  . ibuprofen (ADVIL) 800 MG tablet TAKE 1 TABLET BY MOUTH EVERY 8 HOURS AS NEEDED FOR HEADACHE OR MILD PAIN 90 tablet 1  . metFORMIN (GLUCOPHAGE) 500 MG tablet TAKE 2 TABLETS BY MOUTH TWICE A DAY 360 tablet 3  . potassium chloride SA (KLOR-CON M20) 20 MEQ tablet Take 2 tablets (40 mEq total) by mouth daily. 180 tablet 3  . sacubitril-valsartan (ENTRESTO) 97-103 MG Take 1 tablet by mouth 2 (two) times daily. 180 tablet 3  . sertraline (ZOLOFT) 50 MG tablet Take 1 tablet (50 mg total) by mouth daily. 30 tablet 0  . simvastatin (ZOCOR) 20 MG tablet TAKE 1 TABLET BY MOUTH EVERY DAY 90 tablet 2  . spironolactone (ALDACTONE) 25 MG tablet Take 1 tablet (25 mg total) by mouth daily. 90 tablet 3   No current facility-administered medications for this visit.     Musculoskeletal: Strength & Muscle Tone: within normal limits Gait & Station: normal Patient leans: N/A  Psychiatric Specialty Exam: Review of Systems reports shortness of breath on exertion  There were no vitals taken for this visit.There is no height or weight on file to calculate BMI.  General Appearance: Well Groomed  Eye Contact:  Good  Speech:  Normal Rate  Volume:  Normal  Mood:  remains depressed, but mood has  improved compared to previous visit   Affect:  constricted, vaguely anxious, tends to improve during session , becomes more reactive   Thought Process:  Linear and Descriptions of Associations: Intact  Orientation:  Full (Time, Place, and Person)  Thought Content: No hallucinations, no delusions   Suicidal Thoughts:  No - no suicidal ideations  Homicidal Thoughts:  No  Memory:  Recent and remote grossly intact  Judgement:  Present   Insight:  Present   Psychomotor Activity:  Normal  Concentration:  Concentration: Good and Attention Span: Good  Recall:  Good  Fund of Knowledge: Good  Language: Good  Akathisia:  Negative  Handed:  Right  AIMS (if indicated):   Assets:  Communication Skills Desire for Improvement Resilience  ADL's:  Intact  Cognition: WNL  Sleep:  Good   Screenings: AUDIT     Admission (Discharged) from 12/05/2013 in [place] [facility]  Alcohol Use Disorder Identification Test Final Score (AUDIT) 0    PHQ2-9     Office Visit from 11/07/2017 in [place] Visit from 10/18/2016 in [place] from 06/26/2016 in [place] Visit from 07/19/2015 in [facility] Visit from 07/05/2015 in [place] at [facility]  PHQ-2 Total Score 2 5 4 2  0  PHQ-9 Total Score 11 8 21 5  --       Assessment and Plan:  54 year old female.  History of MDD and anxiety.  Recently restarted Zoloft, Buspar for depression and anxiety management after having been off psychiatric medications for a period of months   She is tolerating medications well and does not endorse side effects. She does report some lingering depression and anxiety, and reports worrying about family and health issues . Dyspnea ( exertional) is reported as worsening overtime and may be contributing to anxiety as well .   Affect noted to be reactive and to improve during session. No SI, no psychotic symptoms.  Will titrate Zoloft  To 100  mg daily and  BuSpar to 15 mg twice daily.  Has appointment to see her new PCP later this week. She already  has an established outpatient cardiologist .   Will see in about 4 weeks for follow up- she agrees to contact clinic sooner if any worsening  prior.        Jenne Campus, MD 01/10/2020, 2:40 PM

## 2020-01-11 ENCOUNTER — Other Ambulatory Visit: Payer: Self-pay | Admitting: Family Medicine

## 2020-01-11 DIAGNOSIS — F418 Other specified anxiety disorders: Secondary | ICD-10-CM

## 2020-01-12 ENCOUNTER — Ambulatory Visit: Payer: Medicare Other | Admitting: Internal Medicine

## 2020-01-13 ENCOUNTER — Telehealth: Payer: Self-pay | Admitting: Cardiology

## 2020-01-13 NOTE — Telephone Encounter (Signed)
Patient is aware will route to Dr Percival Spanish  To review and comment.

## 2020-01-13 NOTE — Telephone Encounter (Signed)
Can we get this message to Dr. Parke Poisson that I don't see a cardiac contraindication to take the clonazepam for anxiety.  Dr. Percival Spanish

## 2020-01-13 NOTE — Telephone Encounter (Signed)
Patient calling in to request approval from Dr. Percival Spanish to take Clonazepam. Patients psychiatrist, Dr. Parke Poisson, is hesitating to put her on the medication due to patients heart condition. Patient would like Dr. Percival Spanish to call her to go over if the medication is okay to be on. Dr. Parke Poisson office can be reached at [phone]  Patient says if Dr. Percival Spanish has any other suggestions for medication that will help decrease anxiety she is open to hearing it.    Please call/advise. Thank you!

## 2020-01-14 ENCOUNTER — Telehealth (HOSPITAL_COMMUNITY): Payer: Self-pay | Admitting: *Deleted

## 2020-01-14 ENCOUNTER — Other Ambulatory Visit: Payer: Self-pay | Admitting: Family Medicine

## 2020-01-14 ENCOUNTER — Other Ambulatory Visit (HOSPITAL_COMMUNITY): Payer: Self-pay | Admitting: *Deleted

## 2020-01-14 DIAGNOSIS — F418 Other specified anxiety disorders: Secondary | ICD-10-CM

## 2020-01-14 MED ORDER — CLONAZEPAM 0.5 MG PO TABS
0.5000 mg | ORAL_TABLET | Freq: Two times a day (BID) | ORAL | 0 refills | Status: DC | PRN
Start: 2020-01-14 — End: 2020-02-08

## 2020-01-14 NOTE — Telephone Encounter (Signed)
Writer spoke with pt to inform Rx for Clonazepam was called in to [facility]. As Cardiologist has stated no contraindications. Nurse instructed pt not to take medication at bedtime due to her OAD. Pt verbalizes understanding. This is also added tp SIG.

## 2020-01-25 ENCOUNTER — Ambulatory Visit: Payer: Medicare Other | Admitting: Internal Medicine

## 2020-01-25 ENCOUNTER — Other Ambulatory Visit: Payer: Self-pay

## 2020-02-07 ENCOUNTER — Other Ambulatory Visit: Payer: Self-pay

## 2020-02-07 ENCOUNTER — Ambulatory Visit: Payer: Medicare Other | Admitting: Internal Medicine

## 2020-02-07 ENCOUNTER — Telehealth (INDEPENDENT_AMBULATORY_CARE_PROVIDER_SITE_OTHER): Payer: Medicare Other | Admitting: Psychiatry

## 2020-02-07 DIAGNOSIS — F332 Major depressive disorder, recurrent severe without psychotic features: Secondary | ICD-10-CM

## 2020-02-07 MED ORDER — SERTRALINE HCL 100 MG PO TABS: 150.0000 mg | ORAL_TABLET | Freq: Every day | ORAL | 0 refills | Status: DC

## 2020-02-07 MED ORDER — BUSPIRONE HCL 15 MG PO TABS: 15.0000 mg | ORAL_TABLET | Freq: Two times a day (BID) | ORAL | 0 refills | Status: DC

## 2020-02-07 NOTE — Progress Notes (Signed)
02/07/2020 visit addendum  Phone based session Location of parties Patient- home Provider- [facility] outpatient clinic   Duration of session 35 minutes   F Adithya Difrancesco MD

## 2020-02-07 NOTE — Progress Notes (Signed)
BH MD/PA/NP OP Progress Note  02/07/2020 3:54 PM [name]  MRN:  [id number]  Chief Complaint: Patient returns for medication management appointment This appointment was done telephonically. Patient's identity verified using two different identifiers . Limitations associated with this mode of communication have been reviewed.  HPI: 54 year old female.  History of MDD, Anxiety Disorder ( consider GAD).   She reports that she has continued to experience depression and anxiety. Describes some anhedonia and subjectively low energy level. Denies SI. No psychotic symptoms reported or noted .  With regards to anxiety, she reports for example a subjective sense of increased anxiety or " dread" at times, but no overt panic attacks. Although generally enjoys being out of the house , has had agoraphobic type symptoms at times, preferring to remain at home  due to anxiety.  She reports increased anxiety related to recent stressors- ruminates  about issues related to PCP appointment. States that establishing initial visit with PCP was complicated by her having had a poor Dr /patient relationship with a PCP back in 2013, at which time the Doctor -patient relationship was terminated via a letter . States " I had to explain what had happened before they agreed to see me". She is now on low dose Klonopin  PRN  for anxiety, which she states she is taking only occasionally.  Received note from her cardiologist related to having no medical/cardiac contraindication to initiating BZD .   Denies suicidal ideations .     Visit Diagnosis: MDD, consider GAD versus Adjustment Disorder  Past Psychiatric History:   Past Medical History:  Past Medical History:  Diagnosis Date  . ADD (attention deficit disorder)   . Anxiety   . Arthritis    both knees right worse than left  . Carpal tunnel syndrome of right wrist   . Depression   . Essential hypertension, benign   . Gallstones   . History of smoking 06/22/2016   . Hyperlipidemia associated with type 2 diabetes mellitus ([place]), on Zocor 04/04/2011  . Hypertension associated with diabetes ([place]) 06/03/2008  . Migraines   . Morbid obesity ([place])   . Morbid obesity with BMI of 50.0-59.9, adult ([place]) 07/12/2006  . NICM (nonischemic cardiomyopathy) ([place]) 07/12/2006   01/16/18 ECHO:   - Procedure narrative: Transthoracic echocardiography. Image   quality was suboptimal. The study was technically difficult.   Intravenous contrast (Definity) was administered. - Left ventricle: The cavity size was moderately dilated. Wall   thickness was increased in a pattern of mild LVH. Systolic   function was moderately to severely reduced. The estimated   ejection fraction w  . Normal coronary arteries 04/17/2016  . OSA on CPAP 10/28/2014  . Spinal stenosis    back pain  . Spondylosis without myelopathy or radiculopathy, lumbar region 12/04/2017  . Type 2 diabetes mellitus with hyperglycemia [facility])     Past Surgical History:  Procedure Laterality Date  . CHOLECYSTECTOMY    . DILATATION & CURETTAGE/HYSTEROSCOPY WITH MYOSURE N/A 10/31/2017   Procedure: DILATATION & CURETTAGE/HYSTEROSCOPY WITH MYOSURE;  Surgeon: Salvadore Dom, MD;  Location: [facility];  Service: Gynecology;  Laterality: N/A;  . RIGHT/LEFT HEART CATH AND CORONARY ANGIOGRAPHY N/A 04/11/2016   Procedure: Right/Left Heart Cath and Coronary Angiography;  Surgeon: Burnell Blanks, MD;  Location: [place];  Service: Cardiovascular;  Laterality: N/A;  . sonogram for blood clots     no blockages    Family Psychiatric History:   Family History:  Family History  Problem  Relation Age of Onset  . Depression Mother   . Anxiety disorder Mother   . Diabetes Mother   . Hypertension Mother   . ADD / ADHD Father   . Diabetes Father   . Hypertension Father   . Hypertension Other   . Diabetes Other   . Colitis Other   . Alcohol abuse Other     Social History:  Social History   Socioeconomic History   . Marital status: Married    Spouse name: Not on file  . Number of children: Not on file  . Years of education: Not on file  . Highest education level: Not on file  Occupational History  . Not on file  Tobacco Use  . Smoking status: Former Smoker    Packs/day: 0.50    Types: Cigarettes    Quit date: 02/25/1997    Years since quitting: 22.9  . Smokeless tobacco: Never Used  . Tobacco comment: Married, lives with spouse (when he is not traveling) and son  Vaping Use  . Vaping Use: Never used  Substance and Sexual Activity  . Alcohol use: No  . Drug use: No  . Sexual activity: Yes    Partners: Male    Birth control/protection: None  Other Topics Concern  . Not on file  Social History Narrative  . Not on file   Social Determinants of Health   Financial Resource Strain: Not on file  Food Insecurity: Not on file  Transportation Needs: Not on file  Physical Activity: Not on file  Stress: Not on file  Social Connections: Not on file    Allergies:  Allergies  Allergen Reactions  . Fluoxetine Other (See Comments)    More depressed  . Cymbalta [Duloxetine Hcl] Other (See Comments)    depressed  . Penicillins     REACTION: rash Has patient had a PCN reaction causing immediate rash, facial/tongue/throat swelling, SOB or lightheadedness with hypotension: YES Has patient had a PCN reaction causing severe rash involving mucus membranes or skin necrosis: NO Has patient had a PCN reaction that required hospitalization: YES Has patient had a PCN reaction occurring within the last 10 years: NO If all of the above answers are "NO", then may proceed with Cephalosporin use.     Metabolic Disorder Labs: Lab Results  Component Value Date   HGBA1C 6.7 (H) 06/04/2018   MPG 139.85 10/29/2017   MPG 125.5 10/19/2016   MPG 125.5 10/19/2016   No results found for: PROLACTIN Lab Results  Component Value Date   CHOL 204 (H) 07/18/2016   TRIG 123.0 07/18/2016   HDL 51.10 07/18/2016    CHOLHDL 4 07/18/2016   VLDL 24.6 07/18/2016   LDLCALC 128 (H) 07/18/2016   LDLCALC 78 11/08/2013   Lab Results  Component Value Date   TSH 3.08 08/25/2018   TSH 6.53 (H) 06/04/2018    Therapeutic Level Labs: No results found for: LITHIUM No results found for: VALPROATE No components found for:  CBMZ  Current Medications: Current Outpatient Medications  Medication Sig Dispense Refill  . amLODipine (NORVASC) 5 MG tablet Take 1 tablet by mouth daily.     . busPIRone (BUSPAR) 15 MG tablet Take 1 tablet (15 mg total) by mouth 2 (two) times daily. 60 tablet 1  . carvedilol (COREG) 12.5 MG tablet Take 1 tablet (12.5 mg total) by mouth 2 (two) times daily with a meal. 180 tablet 1  . carvedilol (COREG) 25 MG tablet Take 1 tablet (25 mg total) by  mouth 2 (two) times daily with a meal. 180 tablet 1  . clonazePAM (KLONOPIN) 0.5 MG tablet Take 1 tablet (0.5 mg total) by mouth 2 (two) times daily as needed for anxiety. Do NOT take at bedtime. 60 tablet 0  . clotrimazole-betamethasone (LOTRISONE) cream APPLY TO AFFECTED AREA TWICE A DAY 45 g 2  . furosemide (LASIX) 80 MG tablet Take 1 tablet (80 mg total) by mouth 2 (two) times daily. 60 tablet 11  . ibuprofen (ADVIL) 800 MG tablet TAKE 1 TABLET BY MOUTH EVERY 8 HOURS AS NEEDED FOR HEADACHE OR MILD PAIN 90 tablet 1  . metFORMIN (GLUCOPHAGE) 500 MG tablet TAKE 2 TABLETS BY MOUTH TWICE A DAY 360 tablet 3  . potassium chloride SA (KLOR-CON M20) 20 MEQ tablet Take 2 tablets (40 mEq total) by mouth daily. 180 tablet 3  . sacubitril-valsartan (ENTRESTO) 97-103 MG Take 1 tablet by mouth 2 (two) times daily. 180 tablet 3  . sertraline (ZOLOFT) 100 MG tablet Take 1 tablet (100 mg total) by mouth daily. 30 tablet 1  . simvastatin (ZOCOR) 20 MG tablet TAKE 1 TABLET BY MOUTH EVERY DAY 90 tablet 2  . spironolactone (ALDACTONE) 25 MG tablet Take 1 tablet (25 mg total) by mouth daily. 90 tablet 3   No current facility-administered medications for this visit.      Musculoskeletal:   Psychiatric Specialty Exam: please take into account difficulties in obtaining full MSE in the context of phone communication Review of Systems  Exertional dyspnea   There were no vitals taken for this visit.There is no height or weight on file to calculate BMI.  General Appearance: NA  Eye Contact:  NA  Speech:  Normal Rate  Volume:  Normal  Mood:  reports feeling depressed   Affect:  difficult to assess as phone based session  Thought Process:  Linear and Descriptions of Associations: Intact  Orientation:  Full (Time, Place, and Person)  Thought Content: No hallucinations, no delusions   Suicidal Thoughts:  No - no suicidal ideations  Homicidal Thoughts:  No  Memory:  Recent and remote grossly intact  Judgement:  Present   Insight:  Present   Psychomotor Activity:  Normal  Concentration:  Concentration: Good and Attention Span: Good  Recall:  Good  Fund of Knowledge: Good  Language: Good  Akathisia:  Negative  Handed:  Right  AIMS (if indicated):   Assets:  Communication Skills Desire for Improvement Resilience  ADL's:  Intact  Cognition: WNL  Sleep:  Good   Screenings: AUDIT   Flowsheet Row Admission (Discharged) from 12/05/2013 in [facility]  Alcohol Use Disorder Identification Test Final Score (AUDIT) 0    PHQ2-9   Flowsheet Row Office Visit from 11/07/2017 in [facility] Visit from 10/18/2016 in [facility] from 06/26/2016 in [facility] Visit from 07/19/2015 in [facility] Visit from 07/05/2015 in [facility]  PHQ-2 Total Score 2 5 4 2  0  PHQ-9 Total Score 11 8 21 5  --       Assessment and Plan:   Ms B. reports still feeling depressed, with neuro-vegetative symptoms including a sense of anhedonia and low energy  level , as well as anxiety, with a frequent subjective sense of anxiety, worry,  vague feelings of " dread" at times, but no overt panic attacks. Denies SI. No psychotic symptoms. Tolerating  medications well thus far .  Increase Zoloft to 150 mgr QDAY . Continue Buspar 15 mgr BID, continue Klonopin 0.5 mgr BID PRN for anxiety.  I have encouraged her to establish individual psychotherapy management as well .  Has an initial appt with PCP later this week.  Will see in 3-4 weeks , agrees to contact clinic sooner if any worsening prior .       Jenne Campus, MD 02/07/2020, 3:54 PM

## 2020-02-08 ENCOUNTER — Other Ambulatory Visit (HOSPITAL_COMMUNITY): Payer: Self-pay | Admitting: *Deleted

## 2020-02-08 MED ORDER — CLONAZEPAM 0.5 MG PO TABS: 0.5000 mg | ORAL_TABLET | Freq: Two times a day (BID) | ORAL | 0 refills | Status: DC | PRN

## 2020-02-10 ENCOUNTER — Ambulatory Visit (INDEPENDENT_AMBULATORY_CARE_PROVIDER_SITE_OTHER): Payer: Medicare Other | Admitting: Internal Medicine

## 2020-02-10 ENCOUNTER — Encounter: Payer: Self-pay | Admitting: Internal Medicine

## 2020-02-10 ENCOUNTER — Other Ambulatory Visit: Payer: Self-pay

## 2020-02-10 VITALS — BP 126/84 | HR 93 | Temp 98.2°F | Ht 64.0 in | Wt 351.0 lb

## 2020-02-10 DIAGNOSIS — Z1231 Encounter for screening mammogram for malignant neoplasm of breast: Secondary | ICD-10-CM

## 2020-02-10 DIAGNOSIS — G8929 Other chronic pain: Secondary | ICD-10-CM | POA: Diagnosis not present

## 2020-02-10 DIAGNOSIS — I428 Other cardiomyopathies: Secondary | ICD-10-CM | POA: Diagnosis not present

## 2020-02-10 DIAGNOSIS — M545 Low back pain, unspecified: Secondary | ICD-10-CM

## 2020-02-10 DIAGNOSIS — I152 Hypertension secondary to endocrine disorders: Secondary | ICD-10-CM

## 2020-02-10 DIAGNOSIS — E1159 Type 2 diabetes mellitus with other circulatory complications: Secondary | ICD-10-CM | POA: Diagnosis not present

## 2020-02-10 DIAGNOSIS — E1169 Type 2 diabetes mellitus with other specified complication: Secondary | ICD-10-CM

## 2020-02-10 DIAGNOSIS — R040 Epistaxis: Secondary | ICD-10-CM | POA: Diagnosis not present

## 2020-02-10 DIAGNOSIS — Z23 Encounter for immunization: Secondary | ICD-10-CM | POA: Diagnosis not present

## 2020-02-10 DIAGNOSIS — E1165 Type 2 diabetes mellitus with hyperglycemia: Secondary | ICD-10-CM

## 2020-02-10 DIAGNOSIS — E785 Hyperlipidemia, unspecified: Secondary | ICD-10-CM

## 2020-02-10 LAB — LIPID PANEL
Cholesterol: 193 mg/dL (ref 0–200)
HDL: 48.9 mg/dL (ref >39.00)
LDL Cholesterol: 115 mg/dL — ABNORMAL HIGH (ref 0–99)
NonHDL: 144.28
Total CHOL/HDL Ratio: 4
Triglycerides: 147 mg/dL (ref 0.0–149.0)
VLDL: 29.4 mg/dL (ref 0.0–40.0)

## 2020-02-10 LAB — URINALYSIS, ROUTINE W REFLEX MICROSCOPIC
Bilirubin Urine: NEGATIVE
Hgb urine dipstick: NEGATIVE
Leukocytes,Ua: NEGATIVE
Nitrite: NEGATIVE
RBC / HPF: NONE SEEN (ref 0–?)
Specific Gravity, Urine: >=1.03 — AB (ref 1.000–1.030)
Total Protein, Urine: NEGATIVE
Urine Glucose: NEGATIVE
Urobilinogen, UA: 1 (ref 0.0–1.0)
pH: 5.5 (ref 5.0–8.0)

## 2020-02-10 LAB — BASIC METABOLIC PANEL WITH GFR
BUN: 16 mg/dL (ref 6–23)
CO2: 32 meq/L (ref 19–32)
Calcium: 9.6 mg/dL (ref 8.4–10.5)
Chloride: 103 meq/L (ref 96–112)
Creatinine, Ser: 0.86 mg/dL (ref 0.40–1.20)
GFR: 76.44 mL/min (ref >60.00)
Glucose, Bld: 109 mg/dL — ABNORMAL HIGH (ref 70–99)
Potassium: 4 meq/L (ref 3.5–5.1)
Sodium: 140 meq/L (ref 135–145)

## 2020-02-10 LAB — HEPATIC FUNCTION PANEL
ALT: 25 U/L (ref 0–35)
AST: 21 U/L (ref 0–37)
Albumin: 4.1 g/dL (ref 3.5–5.2)
Alkaline Phosphatase: 34 U/L — ABNORMAL LOW (ref 39–117)
Bilirubin, Direct: 0.2 mg/dL (ref 0.0–0.3)
Total Bilirubin: 0.9 mg/dL (ref 0.2–1.2)
Total Protein: 7.9 g/dL (ref 6.0–8.3)

## 2020-02-10 LAB — CBC WITH DIFFERENTIAL/PLATELET
Basophils Absolute: 0.1 K/uL (ref 0.0–0.1)
Basophils Relative: 0.7 % (ref 0.0–3.0)
Eosinophils Absolute: 0.1 K/uL (ref 0.0–0.7)
Eosinophils Relative: 1.4 % (ref 0.0–5.0)
HCT: 48.2 % — ABNORMAL HIGH (ref 36.0–46.0)
Hemoglobin: 15.7 g/dL — ABNORMAL HIGH (ref 12.0–15.0)
Lymphocytes Relative: 18.6 % (ref 12.0–46.0)
Lymphs Abs: 1.8 K/uL (ref 0.7–4.0)
MCHC: 32.6 g/dL (ref 30.0–36.0)
MCV: 84.6 fl (ref 78.0–100.0)
Monocytes Absolute: 0.6 K/uL (ref 0.1–1.0)
Monocytes Relative: 6.3 % (ref 3.0–12.0)
Neutro Abs: 7.3 K/uL (ref 1.4–7.7)
Neutrophils Relative %: 73 % (ref 43.0–77.0)
Platelets: 266 K/uL (ref 150.0–400.0)
RBC: 5.7 Mil/uL — ABNORMAL HIGH (ref 3.87–5.11)
RDW: 15.2 % (ref 11.5–15.5)
WBC: 10 K/uL (ref 4.0–10.5)

## 2020-02-10 LAB — MICROALBUMIN / CREATININE URINE RATIO
Creatinine,U: 245.8 mg/dL
Microalb Creat Ratio: 1.5 mg/g (ref 0.0–30.0)
Microalb, Ur: 3.6 mg/dL — ABNORMAL HIGH (ref 0.0–1.9)

## 2020-02-10 LAB — POCT GLYCOSYLATED HEMOGLOBIN (HGB A1C): Hemoglobin A1C: 7.1 % — AB (ref 4.0–5.6)

## 2020-02-10 LAB — TSH: TSH: 3.56 u[IU]/mL (ref 0.35–4.50)

## 2020-02-10 MED ORDER — XIGDUO XR 10-1000 MG PO TB24
1.0000 | ORAL_TABLET | Freq: Every day | ORAL | 1 refills | Status: DC
Start: 2020-02-10 — End: 2020-06-19

## 2020-02-10 MED ORDER — SHINGRIX 50 MCG/0.5ML IM SUSR: 0.5000 mL | Freq: Once | INTRAMUSCULAR | 1 refills | Status: AC

## 2020-02-10 NOTE — Patient Instructions (Signed)
Type 2 Diabetes Mellitus, Diagnosis, Adult Type 2 diabetes (type 2 diabetes mellitus) is a long-term (chronic) disease. In type 2 diabetes, one or both of these problems may be present:  The pancreas does not make enough of a hormone called insulin.  Cells in the body do not respond properly to insulin that the body makes (insulin resistance). Normally, insulin allows blood sugar (glucose) to enter cells in the body. The cells use glucose for energy. Insulin resistance or lack of insulin causes excess glucose to build up in the blood instead of going into cells. As a result, high blood glucose (hyperglycemia) develops. What increases the risk? The following factors may make you more likely to develop type 2 diabetes:  Having a family member with type 2 diabetes.  Being overweight or obese.  Having an inactive (sedentary) lifestyle.  Having been diagnosed with insulin resistance.  Having a history of prediabetes, gestational diabetes, or polycystic ovary syndrome (PCOS).  Being of American-Indian, African-American, Hispanic/Latino, or Asian/Pacific Islander descent. What are the signs or symptoms? In the early stage of this condition, you may not have symptoms. Symptoms develop slowly and may include:  Increased thirst (polydipsia).  Increased hunger(polyphagia).  Increased urination (polyuria).  Increased urination during the night (nocturia).  Unexplained weight loss.  Frequent infections that keep coming back (recurring).  Fatigue.  Weakness.  Vision changes, such as blurry vision.  Cuts or bruises that are slow to heal.  Tingling or numbness in the hands or feet.  Dark patches on the skin (acanthosis nigricans). How is this diagnosed? This condition is diagnosed based on your symptoms, your medical history, a physical exam, and your blood glucose level. Your blood glucose may be checked with one or more of the following blood tests:  A fasting blood glucose (FBG)  test. You will not be allowed to eat (you will fast) for 8 hours or longer before a blood sample is taken.  A random blood glucose test. This test checks blood glucose at any time of day regardless of when you ate.  An A1c (hemoglobin A1c) blood test. This test provides information about blood glucose control over the previous 2-3 months.  An oral glucose tolerance test (OGTT). This test measures your blood glucose at two times: ? After fasting. This is your baseline blood glucose level. ? Two hours after drinking a beverage that contains glucose. You may be diagnosed with type 2 diabetes if:  Your FBG level is 126 mg/dL (7.0 mmol/L) or higher.  Your random blood glucose level is 200 mg/dL (11.1 mmol/L) or higher.  Your A1c level is 6.5% or higher.  Your OGTT result is higher than 200 mg/dL (11.1 mmol/L). These blood tests may be repeated to confirm your diagnosis. How is this treated? Your treatment may be managed by a specialist called an endocrinologist. Type 2 diabetes may be treated by following instructions from your health care provider about:  Making diet and lifestyle changes. This may include: ? Following an individualized nutrition plan that is developed by a diet and nutrition specialist (registered dietitian). ? Exercising regularly. ? Finding ways to manage stress.  Checking your blood glucose level as often as told.  Taking diabetes medicines or insulin daily. This helps to keep your blood glucose levels in the healthy range. ? If you use insulin, you may need to adjust the dosage depending on how physically active you are and what foods you eat. Your health care provider will tell you how to adjust your dosage.    Taking medicines to help prevent complications from diabetes, such as: ? Aspirin. ? Medicine to lower cholesterol. ? Medicine to control blood pressure. Your health care provider will set individualized treatment goals for you. Your goals will be based on  your age, other medical conditions you have, and how you respond to diabetes treatment. Generally, the goal of treatment is to maintain the following blood glucose levels:  Before meals (preprandial): 80-130 mg/dL (4.4-7.2 mmol/L).  After meals (postprandial): below 180 mg/dL (10 mmol/L).  A1c level: less than 7%. Follow these instructions at home: Questions to ask your health care provider  Consider asking the following questions: ? Do I need to meet with a diabetes educator? ? Where can I find a support group for people with diabetes? ? What equipment will I need to manage my diabetes at home? ? What diabetes medicines do I need, and when should I take them? ? How often do I need to check my blood glucose? ? What number can I call if I have questions? ? When is my next appointment? General instructions  Take over-the-counter and prescription medicines only as told by your health care provider.  Keep all follow-up visits as told by your health care provider. This is important.  For more information about diabetes, visit: ? American Diabetes Association (ADA): www.diabetes.org ? American Association of Diabetes Educators (AADE): www.diabeteseducator.org Contact a health care provider if:  Your blood glucose is at or above 240 mg/dL (13.3 mmol/L) for 2 days in a row.  You have been sick or have had a fever for 2 days or longer, and you are not getting better.  You have any of the following problems for more than 6 hours: ? You cannot eat or drink. ? You have nausea and vomiting. ? You have diarrhea. Get help right away if:  Your blood glucose is lower than 54 mg/dL (3.0 mmol/L).  You become confused or you have trouble thinking clearly.  You have difficulty breathing.  You have moderate or large ketone levels in your urine. Summary  Type 2 diabetes (type 2 diabetes mellitus) is a long-term (chronic) disease. In type 2 diabetes, the pancreas does not make enough of a  hormone called insulin, or cells in the body do not respond properly to insulin that the body makes (insulin resistance).  This condition is treated by making diet and lifestyle changes and taking diabetes medicines or insulin.  Your health care provider will set individualized treatment goals for you. Your goals will be based on your age, other medical conditions you have, and how you respond to diabetes treatment.  Keep all follow-up visits as told by your health care provider. This is important. This information is not intended to replace advice given to you by your health care provider. Make sure you discuss any questions you have with your health care provider. Document Revised: 04/11/2017 Document Reviewed: 03/17/2015 Elsevier Patient Education  2020 [employer]  

## 2020-02-10 NOTE — Progress Notes (Signed)
Subjective:  Patient ID: [name], female    DOB: [birth year]  Age: 54 y.o. MRN: [id number]  CC: Diabetes and Hypertension  This visit occurred during the SARS-CoV-2 public health emergency.  Safety protocols were in place, including screening questions prior to the visit, additional usage of staff PPE, and extensive cleaning of exam room while observing appropriate contact time as indicated for disinfecting solutions.   NEW TO ME  HPI [name] presents for for establishing.  She has multiple medical illnesses and chronic symptoms.  She comes in today for evaluation and treatment of diabetes.  It sounds like she has diabetic neuropathy with burning pain in her feet.  She complains of polyuria but denies polydipsia or polyphagia.  She is not currently monitoring her blood sugar or treating the diabetes.  She has had an extensive cardiovascular work-up for shortness of breath and DOE.  She has a history of pregnancy-induced cardiomyopathy.  She tells me she is taking all of the listed medications.  She is seeing a psychiatrist for treatment of ADHD, depression, and anxiety.  She has a history of sleep apnea and is using a CPAP machine.  She has chronic low back pain and wants to know if she has ankylosing spondylitis.  She has chronic epistaxis on the left side and nasal congestion.  History [name] has a past medical history of ADD (attention deficit disorder), Anxiety, Arthritis, Carpal tunnel syndrome of right wrist, Depression, Essential hypertension, benign, Gallstones, History of smoking (06/22/2016), Hyperlipidemia associated with type 2 diabetes mellitus ([place]), on Zocor (04/04/2011), Hypertension associated with diabetes ([place]) (06/03/2008), Migraines, Morbid obesity ([place]), Morbid obesity with BMI of 50.0-59.9, adult ([place]) (07/12/2006), NICM (nonischemic cardiomyopathy) ([place]) (07/12/2006), Normal coronary arteries (04/17/2016), OSA on CPAP (10/28/2014), Spinal stenosis, Spondylosis without myelopathy  or radiculopathy, lumbar region (12/04/2017), and Type 2 diabetes mellitus with hyperglycemia ([place]).   She has a past surgical history that includes Cholecystectomy; RIGHT/LEFT HEART CATH AND CORONARY ANGIOGRAPHY (N/A, 04/11/2016); sonogram for blood clots; and Dilatation & curettage/hysteroscopy with myosure (N/A, 10/31/2017).   Her family history includes ADD / ADHD in her father; Alcohol abuse in an other family member; Anxiety disorder in her mother; Colitis in an other family member; Depression in her mother; Diabetes in her father, mother, and another family member; Hypertension in her father, mother, and another family member.She reports that she quit smoking about 22 years ago. Her smoking use included cigarettes. She smoked 0.50 packs per day. She has never used smokeless tobacco. She reports that she does not drink alcohol and does not use drugs.  Outpatient Medications Prior to Visit  Medication Sig Dispense Refill  . amLODipine (NORVASC) 5 MG tablet Take 1 tablet by mouth daily.     . busPIRone (BUSPAR) 15 MG tablet Take 1 tablet (15 mg total) by mouth 2 (two) times daily. 60 tablet 0  . carvedilol (COREG) 12.5 MG tablet Take 1 tablet (12.5 mg total) by mouth 2 (two) times daily with a meal. 180 tablet 1  . carvedilol (COREG) 25 MG tablet Take 1 tablet (25 mg total) by mouth 2 (two) times daily with a meal. 180 tablet 1  . clonazePAM (KLONOPIN) 0.5 MG tablet Take 1 tablet (0.5 mg total) by mouth 2 (two) times daily as needed for anxiety. Do NOT take at bedtime. 20 tablet 0  . clotrimazole-betamethasone (LOTRISONE) cream APPLY TO AFFECTED AREA TWICE A DAY 45 g 2  . furosemide (LASIX) 80 MG tablet Take 1 tablet (80 mg total) by  mouth 2 (two) times daily. 60 tablet 11  . potassium chloride SA (KLOR-CON M20) 20 MEQ tablet Take 2 tablets (40 mEq total) by mouth daily. 180 tablet 3  . sacubitril-valsartan (ENTRESTO) 97-103 MG Take 1 tablet by mouth 2 (two) times daily. 180 tablet 3  . sertraline  (ZOLOFT) 100 MG tablet Take 1.5 tablets (150 mg total) by mouth daily. 45 tablet 0  . simvastatin (ZOCOR) 20 MG tablet TAKE 1 TABLET BY MOUTH EVERY DAY 90 tablet 2  . ibuprofen (ADVIL) 800 MG tablet TAKE 1 TABLET BY MOUTH EVERY 8 HOURS AS NEEDED FOR HEADACHE OR MILD PAIN 90 tablet 1  . metFORMIN (GLUCOPHAGE) 500 MG tablet TAKE 2 TABLETS BY MOUTH TWICE A DAY 360 tablet 3  . spironolactone (ALDACTONE) 25 MG tablet Take 1 tablet (25 mg total) by mouth daily. 90 tablet 3   No facility-administered medications prior to visit.    ROS Review of Systems  Constitutional: Positive for fatigue and unexpected weight change (wt gain). Negative for appetite change, chills, diaphoresis and fever.  HENT: Positive for congestion and nosebleeds. Negative for sinus pain.   Respiratory: Positive for apnea and shortness of breath. Negative for cough, chest tightness, wheezing and stridor.   Cardiovascular: Negative for chest pain, palpitations and leg swelling.  Gastrointestinal: Negative for abdominal pain, constipation, diarrhea, nausea and vomiting.  Endocrine: Positive for polyuria. Negative for polydipsia and polyphagia.  Genitourinary: Negative.  Negative for difficulty urinating and dysuria.  Musculoskeletal: Positive for back pain and gait problem.  Skin: Negative.  Negative for color change and pallor.  Neurological: Negative for dizziness, weakness and numbness.  Hematological: Negative for adenopathy. Does not bruise/bleed easily.  Psychiatric/Behavioral: Negative.     Objective:  BP 126/84   Pulse 93   Temp 98.2 F (36.8 C) (Oral)   Ht 5\' 4"  (1.626 m)   Wt (!) 351 lb (159.2 kg)   SpO2 94%   BMI 60.25 kg/m   Physical Exam Constitutional:      Appearance: She is obese.  HENT:     Nose: Mucosal edema and congestion present. No rhinorrhea.     Right Nostril: No foreign body, epistaxis or septal hematoma.     Left Nostril: No foreign body, epistaxis or septal hematoma.     Mouth/Throat:      Mouth: Mucous membranes are moist.  Eyes:     General: No scleral icterus.    Conjunctiva/sclera: Conjunctivae normal.  Cardiovascular:     Rate and Rhythm: Normal rate and regular rhythm.     Heart sounds: No murmur heard.   Pulmonary:     Effort: Pulmonary effort is normal.     Breath sounds: No stridor. No wheezing or rhonchi.  Abdominal:     General: Abdomen is protuberant. Bowel sounds are normal. There is no distension.     Palpations: Abdomen is soft. There is no hepatomegaly, splenomegaly or mass.  Musculoskeletal:        General: Normal range of motion.     Cervical back: Neck supple.     Right lower leg: No edema.     Left lower leg: No edema.  Skin:    General: Skin is warm and dry.  Neurological:     General: No focal deficit present.     Mental Status: She is alert and oriented to person, place, and time.  Psychiatric:        Attention and Perception: She is inattentive.  Mood and Affect: Mood is anxious and depressed. Affect is tearful.        Speech: Speech is tangential.        Behavior: Behavior normal. Behavior is cooperative.        Thought Content: Thought content normal. Thought content is not paranoid or delusional. Thought content does not include homicidal or suicidal ideation.        Judgment: Judgment normal.     Lab Results  Component Value Date   WBC 10.0 02/10/2020   HGB 15.7 (H) 02/10/2020   HCT 48.2 (H) 02/10/2020   PLT 266.0 02/10/2020   GLUCOSE 109 (H) 02/10/2020   CHOL 193 02/10/2020   TRIG 147.0 02/10/2020   HDL 48.90 02/10/2020   LDLDIRECT 143.8 04/03/2011   LDLCALC 115 (H) 02/10/2020   ALT 25 02/10/2020   AST 21 02/10/2020   NA 140 02/10/2020   K 4.0 02/10/2020   CL 103 02/10/2020   CREATININE 0.86 02/10/2020   BUN 16 02/10/2020   CO2 32 02/10/2020   TSH 3.56 02/10/2020   INR 1.0 04/08/2016   HGBA1C 7.1 (A) 02/10/2020   MICROALBUR 3.6 (H) 02/10/2020    Assessment & Plan:   [name] was seen today for diabetes  and hypertension.  Diagnoses and all orders for this visit:  Type 2 diabetes mellitus with hyperglycemia, without long-term current use of insulin ([place])- Her A1c is at 7.1%.  I recommended that she take Metformin and an SGLT2 inhibitor. -     Basic metabolic panel; Future -     Microalbumin / creatinine urine ratio; Future -     Cancel: Hemoglobin A1c; Future -     Urinalysis, Routine w reflex microscopic; Future -     POCT glycosylated hemoglobin (Hb A1C) -     Dapagliflozin-metFORMIN HCl ER (XIGDUO XR) 11-998 MG TB24; Take 1 tablet by mouth daily. -     HM Diabetes Foot Exam -     Ambulatory referral to Ophthalmology -     Urinalysis, Routine w reflex microscopic -     Microalbumin / creatinine urine ratio -     Basic metabolic panel  Hyperlipidemia associated with type 2 diabetes mellitus ([place])- She has achieved her LDL goal and is doing well on the statin. -     Lipid panel; Future -     Hepatic function panel; Future -     TSH; Future -     TSH -     Hepatic function panel -     Lipid panel  Hypertension associated with diabetes ([place])- Her blood pressure is adequately well controlled.  Electrolytes and renal function are normal. -     CBC with Differential/Platelet; Future -     Basic metabolic panel; Future -     Hepatic function panel; Future -     TSH; Future -     Urinalysis, Routine w reflex microscopic; Future -     Urinalysis, Routine w reflex microscopic -     TSH -     Hepatic function panel -     Basic metabolic panel -     CBC with Differential/Platelet  Epistaxis -     Ambulatory referral to ENT  NICM (nonischemic cardiomyopathy) (HCC) -     Dapagliflozin-metFORMIN HCl ER (XIGDUO XR) 11-998 MG TB24; Take 1 tablet by mouth daily.  Need for shingles vaccine -     Zoster Vaccine Adjuvanted [facility]) injection; Inject 0.5 mLs into the muscle once for 1  dose.  Chronic bilateral low back pain without sciatica -     Ambulatory referral to Sports  Medicine  Visit for screening mammogram -     MM DIGITAL SCREENING BILATERAL; Future  Other orders -     Flu Vaccine QUAD 6+ mos PF IM (Fluarix Quad PF) -     Pneumococcal polysaccharide vaccine 23-valent greater than or equal to 2yo subcutaneous/IM   I have discontinued [name]'s metFORMIN and ibuprofen. I am also having her start on Xigduo XR and Shingrix. Additionally, I am having her maintain her carvedilol, carvedilol, spironolactone, amLODipine, clotrimazole-betamethasone, Entresto, simvastatin, furosemide, potassium chloride SA, busPIRone, sertraline, and clonazePAM.  Meds ordered this encounter  Medications  . Dapagliflozin-metFORMIN HCl ER (XIGDUO XR) 11-998 MG TB24    Sig: Take 1 tablet by mouth daily.    Dispense:  90 tablet    Refill:  1  . Zoster Vaccine Adjuvanted [facility]) injection    Sig: Inject 0.5 mLs into the muscle once for 1 dose.    Dispense:  0.5 mL    Refill:  1     Follow-up: Return in about 3 months (around 05/10/2020).  [name], MD

## 2020-02-11 DIAGNOSIS — Z1231 Encounter for screening mammogram for malignant neoplasm of breast: Secondary | ICD-10-CM | POA: Insufficient documentation

## 2020-02-12 ENCOUNTER — Encounter: Payer: Self-pay | Admitting: Internal Medicine

## 2020-02-15 ENCOUNTER — Encounter: Payer: Self-pay | Admitting: Internal Medicine

## 2020-03-06 ENCOUNTER — Telehealth (INDEPENDENT_AMBULATORY_CARE_PROVIDER_SITE_OTHER): Payer: Medicare Other | Admitting: Psychiatry

## 2020-03-06 ENCOUNTER — Other Ambulatory Visit: Payer: Self-pay

## 2020-03-06 DIAGNOSIS — F332 Major depressive disorder, recurrent severe without psychotic features: Secondary | ICD-10-CM | POA: Diagnosis not present

## 2020-03-06 MED ORDER — BUSPIRONE HCL 15 MG PO TABS: 15.0000 mg | ORAL_TABLET | Freq: Three times a day (TID) | ORAL | 0 refills | Status: DC

## 2020-03-06 MED ORDER — CLONAZEPAM 1 MG PO TABS
1.0000 mg | ORAL_TABLET | Freq: Every day | ORAL | 0 refills | Status: DC | PRN
Start: 2020-03-06 — End: 2020-04-03

## 2020-03-06 MED ORDER — SERTRALINE HCL 100 MG PO TABS: 200.0000 mg | ORAL_TABLET | Freq: Every day | ORAL | 0 refills | Status: DC

## 2020-03-06 NOTE — Progress Notes (Signed)
BH MD/PA/NP OP Progress Note  03/06/2020 1:19 PM [name]  MRN:  [id number]  Chief Complaint: Patient returns for medication management appointment   HPI: 55 year old female.  History of MDD, Anxiety Disorder ( consider GAD).   Reports continues to experience some anxiety and depression.  Affect appears improved and more reactive today.  No SI/ no psychotic symptoms. Future oriented .  Recently established care with PCP, and is also looking forward to appointment with specialist to help address back pain. ( Of note, patient is not currently taking opiate medications) . Started on Metformin, reports has had some nausea related to this medication but is continuing to take, as she feels this side effect may resolve after a couple of weeks. Denies medication side effects. She feels, however, that Zoloft has been only partially helpful, as continues to experience some anxiety, depression and subjective sense of low energy/motivation. She states she has been taking Klonopin 2-3 x per week if that, does not take daily, but when she does take it, she feels 1 mgr dose is more effective than 0.5 mgr .  Today we reviewed diagnosis- she reports history of recurrent severe depressive episodes . She has had brief episodes of feeling more energetic but states " it is never too much, I just feel like I have a little more energy and can get things done".  No clear history of hypomania or mania.  We discussed medication management options- has recently been diagnosed with peripheral neuropathy and we reviewed Cymbalta as an option. She states that a prior Cymbalta trial resulted in worsened depression. Also remembers Prozac as ineffective in the past. Had improved/ been stable on Zoloft during past trial.     Visit Diagnosis: MDD, consider GAD versus Adjustment Disorder  Past Psychiatric History:   Past Medical History:  Past Medical History:  Diagnosis Date  . ADD (attention deficit disorder)   .  Anxiety   . Arthritis    both knees right worse than left  . Carpal tunnel syndrome of right wrist   . Depression   . Essential hypertension, benign   . Gallstones   . History of smoking 06/22/2016  . Hyperlipidemia associated with type 2 diabetes mellitus ([place]), on Zocor 04/04/2011  . Hypertension associated with diabetes ([place]) 06/03/2008  . Migraines   . Morbid obesity ([place])   . Morbid obesity with BMI of 50.0-59.9, adult ([place]) 07/12/2006  . NICM (nonischemic cardiomyopathy) ([place]) 07/12/2006   01/16/18 ECHO:   - Procedure narrative: Transthoracic echocardiography. Image   quality was suboptimal. The study was technically difficult.   Intravenous contrast (Definity) was administered. - Left ventricle: The cavity size was moderately dilated. Wall   thickness was increased in a pattern of mild LVH. Systolic   function was moderately to severely reduced. The estimated   ejection fraction w  . Normal coronary arteries 04/17/2016  . OSA on CPAP 10/28/2014  . Spinal stenosis    back pain  . Spondylosis without myelopathy or radiculopathy, lumbar region 12/04/2017  . Type 2 diabetes mellitus with hyperglycemia [facility]     Past Surgical History:  Procedure Laterality Date  . CHOLECYSTECTOMY    . DILATATION & CURETTAGE/HYSTEROSCOPY WITH MYOSURE N/A 10/31/2017   Procedure: DILATATION & CURETTAGE/HYSTEROSCOPY WITH MYOSURE;  Surgeon: Salvadore Dom, MD;  Location: [facility];  Service: Gynecology;  Laterality: N/A;  . RIGHT/LEFT HEART CATH AND CORONARY ANGIOGRAPHY N/A 04/11/2016   Procedure: Right/Left Heart Cath and Coronary Angiography;  Surgeon: Burnell Blanks, MD;  Location: [facility];  Service: Cardiovascular;  Laterality: N/A;  . sonogram for blood clots     no blockages    Family Psychiatric History:   Family History:  Family History  Problem Relation Age of Onset  . Depression Mother   . Anxiety disorder Mother   . Diabetes Mother   . Hypertension Mother   . ADD / ADHD  Father   . Diabetes Father   . Hypertension Father   . Hypertension Other   . Diabetes Other   . Colitis Other   . Alcohol abuse Other     Social History:  Social History   Socioeconomic History  . Marital status: Married    Spouse name: Not on file  . Number of children: Not on file  . Years of education: Not on file  . Highest education level: Not on file  Occupational History  . Not on file  Tobacco Use  . Smoking status: Former Smoker    Packs/day: 0.50    Types: Cigarettes    Quit date: 02/25/1997    Years since quitting: 23.0  . Smokeless tobacco: Never Used  . Tobacco comment: Married, lives with spouse (when he is not traveling) and son  Vaping Use  . Vaping Use: Never used  Substance and Sexual Activity  . Alcohol use: No  . Drug use: No  . Sexual activity: Yes    Partners: Male    Birth control/protection: None  Other Topics Concern  . Not on file  Social History Narrative  . Not on file   Social Determinants of Health   Financial Resource Strain: Not on file  Food Insecurity: Not on file  Transportation Needs: Not on file  Physical Activity: Not on file  Stress: Not on file  Social Connections: Not on file    Allergies:  Allergies  Allergen Reactions  . Fluoxetine Other (See Comments)    More depressed  . Cymbalta [Duloxetine Hcl] Other (See Comments)    depressed  . Penicillins     REACTION: rash Has patient had a PCN reaction causing immediate rash, facial/tongue/throat swelling, SOB or lightheadedness with hypotension: YES Has patient had a PCN reaction causing severe rash involving mucus membranes or skin necrosis: NO Has patient had a PCN reaction that required hospitalization: YES Has patient had a PCN reaction occurring within the last 10 years: NO If all of the above answers are "NO", then may proceed with Cephalosporin use.     Metabolic Disorder Labs: Lab Results  Component Value Date   HGBA1C 7.1 (A) 02/10/2020   MPG 139.85  10/29/2017   MPG 125.5 10/19/2016   MPG 125.5 10/19/2016   No results found for: PROLACTIN Lab Results  Component Value Date   CHOL 193 02/10/2020   TRIG 147.0 02/10/2020   HDL 48.90 02/10/2020   CHOLHDL 4 02/10/2020   VLDL 29.4 02/10/2020   LDLCALC 115 (H) 02/10/2020   LDLCALC 128 (H) 07/18/2016   Lab Results  Component Value Date   TSH 3.56 02/10/2020   TSH 3.08 08/25/2018    Therapeutic Level Labs: No results found for: LITHIUM No results found for: VALPROATE No components found for:  CBMZ  Current Medications: Current Outpatient Medications  Medication Sig Dispense Refill  . amLODipine (NORVASC) 5 MG tablet Take 1 tablet by mouth daily.     . busPIRone (BUSPAR) 15 MG tablet Take 1 tablet (15 mg total) by mouth 2 (two) times daily. 60 tablet 0  .  carvedilol (COREG) 12.5 MG tablet Take 1 tablet (12.5 mg total) by mouth 2 (two) times daily with a meal. 180 tablet 1  . carvedilol (COREG) 25 MG tablet Take 1 tablet (25 mg total) by mouth 2 (two) times daily with a meal. 180 tablet 1  . clonazePAM (KLONOPIN) 0.5 MG tablet Take 1 tablet (0.5 mg total) by mouth 2 (two) times daily as needed for anxiety. Do NOT take at bedtime. 20 tablet 0  . clotrimazole-betamethasone (LOTRISONE) cream APPLY TO AFFECTED AREA TWICE A DAY 45 g 2  . Dapagliflozin-metFORMIN HCl ER (XIGDUO XR) 11-998 MG TB24 Take 1 tablet by mouth daily. 90 tablet 1  . furosemide (LASIX) 80 MG tablet Take 1 tablet (80 mg total) by mouth 2 (two) times daily. 60 tablet 11  . potassium chloride SA (KLOR-CON M20) 20 MEQ tablet Take 2 tablets (40 mEq total) by mouth daily. 180 tablet 3  . sacubitril-valsartan (ENTRESTO) 97-103 MG Take 1 tablet by mouth 2 (two) times daily. 180 tablet 3  . sertraline (ZOLOFT) 100 MG tablet Take 1.5 tablets (150 mg total) by mouth daily. 45 tablet 0  . simvastatin (ZOCOR) 20 MG tablet TAKE 1 TABLET BY MOUTH EVERY DAY 90 tablet 2  . spironolactone (ALDACTONE) 25 MG tablet Take 1 tablet (25  mg total) by mouth daily. 90 tablet 3   No current facility-administered medications for this visit.     Musculoskeletal:   Psychiatric Specialty Exam:  Review of Systems  Reports nausea,dyspnea has improved  There were no vitals taken for this visit.There is no height or weight on file to calculate BMI.  General Appearance: Well Groomed  Eye Contact:  Good  Speech:  Normal Rate  Volume:  Normal  Mood:  continues to report depression and some anxiety  Affect:  overall presents reactive, smiles at times appropriately  Thought Process:  Linear and Descriptions of Associations: Intact  Orientation:  Full (Time, Place, and Person)  Thought Content: No hallucinations, no delusions   Suicidal Thoughts:  No - no suicidal ideations/future oriented   Homicidal Thoughts:  No  Memory:  Recent and remote grossly intact  Judgement:  Present   Insight:  Present   Psychomotor Activity:  Normal  Concentration:  Concentration: Good and Attention Span: Good  Recall:  Good  Fund of Knowledge: Good  Language: Good  Akathisia:  Negative  Handed:  Right  AIMS (if indicated):   Assets:  Communication Skills Desire for Improvement Resilience  ADL's:  Intact  Cognition: WNL  Sleep:  Good   Screenings: AUDIT   Flowsheet Row Admission (Discharged) from 12/05/2013 in [facility]  Alcohol Use Disorder Identification Test Final Score (AUDIT) 0    PHQ2-9   [place] Office Visit from 02/10/2020 in [place] at [facility] Visit from 11/07/2017 in [place] from 10/18/2016 in [place] from 06/26/2016 in [place] from 07/19/2015 in [place] at [facility] Total Score 0 2 5 4 2   PHQ-9 Total Score 0 11 8 21 5        Assessment and Plan:   Continues to report some depression and anxiety.  Describes persistent sense of low energy and decreased motivation. No SI and is future oriented, no hallucinations. Affect presents improved today. Recently established care with PCP and started on Metformin, most recent hgbA1C 7.1  Tolerating Zoloft, Buspar , Klonopin  well , denies side effects but states improvement has been partial/limited . We discussed options.  Increase Zoloft to 200  mgr QDAY . Increase Buspar to 15 mgr TID, change  Klonopin to 1 mgr PO QDAY PRN  for anxiety. As noted, takes 2-3 x per week as needed.   I have continued to encourage  her to establish individual psychotherapy management as well .  Will see in 3-4 weeks , agrees to contact clinic sooner if any worsening prior .       Jenne Campus, MD 03/06/2020, 1:19 PM

## 2020-03-08 ENCOUNTER — Other Ambulatory Visit: Payer: Self-pay

## 2020-03-08 ENCOUNTER — Ambulatory Visit (INDEPENDENT_AMBULATORY_CARE_PROVIDER_SITE_OTHER): Payer: Medicare Other | Admitting: Family Medicine

## 2020-03-08 ENCOUNTER — Other Ambulatory Visit (HOSPITAL_COMMUNITY): Payer: Self-pay | Admitting: Psychiatry

## 2020-03-08 ENCOUNTER — Encounter: Payer: Self-pay | Admitting: Family Medicine

## 2020-03-08 ENCOUNTER — Ambulatory Visit (INDEPENDENT_AMBULATORY_CARE_PROVIDER_SITE_OTHER): Payer: Medicare Other

## 2020-03-08 VITALS — BP 130/92 | HR 90 | Ht 64.0 in | Wt 341.0 lb

## 2020-03-08 DIAGNOSIS — M47816 Spondylosis without myelopathy or radiculopathy, lumbar region: Secondary | ICD-10-CM | POA: Diagnosis not present

## 2020-03-08 DIAGNOSIS — G8929 Other chronic pain: Secondary | ICD-10-CM

## 2020-03-08 DIAGNOSIS — M545 Low back pain, unspecified: Secondary | ICD-10-CM

## 2020-03-08 NOTE — Patient Instructions (Signed)
[facility] Xray back today Vit D 2000IU daily Ice 20 min 2x a day Do think depression and weight are playing a role See me in 6-8 weeks and we can do MRI

## 2020-03-08 NOTE — Progress Notes (Signed)
[place] Phone: [phone] Subjective:   [name], am serving as a scribe for Dr. Hulan Saas. This visit occurred during the SARS-CoV-2 public health emergency.  Safety protocols were in place, including screening questions prior to the visit, additional usage of staff PPE, and extensive cleaning of exam room while observing appropriate contact time as indicated for disinfecting solutions.   I'm seeing this patient by the request  of:  Samantha Lima, MD  CC: Low back pain  [id number]  [name] is a 55 y.o. female coming in with complaint of lumbar spine pain. Patient states that she has had pain for years. Diminished sensation in bilateral quads. Intermittent itching in quads that she is unable to stop. Was told she has scoliosis. Was given trigger point injections in back which were suppose to evaluate if she could be a candidate for the epidural injection. Patient describes what sounds like an epidural which did not alleviate her pain. Pain improves when in seated position. Increases with standing. Patient feels like she may suffer from neuropathy since 2013.  Patient states that at one point she was going to have another procedure and it sounds like she is describing radiofrequency ablation.  Did not have any relief.  I do not have the notes from this.      Patient did have x-rays of the lumbar spine done in 2019.  Found to have moderate degenerative disc disease and facet arthropathy at multiple levels of the lumbar spine  Past Medical History:  Diagnosis Date  . ADD (attention deficit disorder)   . Anxiety   . Arthritis    both knees right worse than left  . Carpal tunnel syndrome of right wrist   . Depression   . Essential hypertension, benign   . Gallstones   . History of smoking 06/22/2016  . Hyperlipidemia associated with type 2 diabetes mellitus ([place]), on Zocor 04/04/2011  .  Hypertension associated with diabetes ([place]) 06/03/2008  . Migraines   . Morbid obesity ([place])   . Morbid obesity with BMI of 50.0-59.9, adult ([place]) 07/12/2006  . NICM (nonischemic cardiomyopathy) ([place]) 07/12/2006   01/16/18 ECHO:   - Procedure narrative: Transthoracic echocardiography. Image   quality was suboptimal. The study was technically difficult.   Intravenous contrast (Definity) was administered. - Left ventricle: The cavity size was moderately dilated. Wall   thickness was increased in a pattern of mild LVH. Systolic   function was moderately to severely reduced. The estimated   ejection fraction w  . Normal coronary arteries 04/17/2016  . OSA on CPAP 10/28/2014  . Spinal stenosis    back pain  . Spondylosis without myelopathy or radiculopathy, lumbar region 12/04/2017  . Type 2 diabetes mellitus with hyperglycemia [facility])    Past Surgical History:  Procedure Laterality Date  . CHOLECYSTECTOMY    . DILATATION & CURETTAGE/HYSTEROSCOPY WITH MYOSURE N/A 10/31/2017   Procedure: DILATATION & CURETTAGE/HYSTEROSCOPY WITH MYOSURE;  Surgeon: Salvadore Dom, MD;  Location: [facility];  Service: Gynecology;  Laterality: N/A;  . RIGHT/LEFT HEART CATH AND CORONARY ANGIOGRAPHY N/A 04/11/2016   Procedure: Right/Left Heart Cath and Coronary Angiography;  Surgeon: Burnell Blanks, MD;  Location: [facility];  Service: Cardiovascular;  Laterality: N/A;  . sonogram for blood clots     no blockages   Social History   Socioeconomic History  . Marital status: Married    Spouse name: Not on file  .  Number of children: Not on file  . Years of education: Not on file  . Highest education level: Not on file  Occupational History  . Not on file  Tobacco Use  . Smoking status: Former Smoker    Packs/day: 0.50    Types: Cigarettes    Quit date: 02/25/1997    Years since quitting: 23.0  . Smokeless tobacco: Never Used  . Tobacco comment: Married, lives with spouse (when he is not traveling) and  son  Vaping Use  . Vaping Use: Never used  Substance and Sexual Activity  . Alcohol use: No  . Drug use: No  . Sexual activity: Yes    Partners: Male    Birth control/protection: None  Other Topics Concern  . Not on file  Social History Narrative  . Not on file   Social Determinants of Health   Financial Resource Strain: Not on file  Food Insecurity: Not on file  Transportation Needs: Not on file  Physical Activity: Not on file  Stress: Not on file  Social Connections: Not on file   Allergies  Allergen Reactions  . Fluoxetine Other (See Comments)    More depressed  . Cymbalta [Duloxetine Hcl] Other (See Comments)    depressed  . Penicillins     REACTION: rash Has patient had a PCN reaction causing immediate rash, facial/tongue/throat swelling, SOB or lightheadedness with hypotension: YES Has patient had a PCN reaction causing severe rash involving mucus membranes or skin necrosis: NO Has patient had a PCN reaction that required hospitalization: YES Has patient had a PCN reaction occurring within the last 10 years: NO If all of the above answers are "NO", then may proceed with Cephalosporin use.    Family History  Problem Relation Age of Onset  . Depression Mother   . Anxiety disorder Mother   . Diabetes Mother   . Hypertension Mother   . ADD / ADHD Father   . Diabetes Father   . Hypertension Father   . Hypertension Other   . Diabetes Other   . Colitis Other   . Alcohol abuse Other     Current Outpatient Medications (Endocrine & Metabolic):  [name]  Dapagliflozin-metFORMIN HCl ER (XIGDUO XR) 11-998 MG TB24, Take 1 tablet by mouth daily.  Current Outpatient Medications (Cardiovascular):  .  amLODipine (NORVASC) 5 MG tablet, Take 1 tablet by mouth daily.  .  carvedilol (COREG) 12.5 MG tablet, Take 1 tablet (12.5 mg total) by mouth 2 (two) times daily with a meal. .  carvedilol (COREG) 25 MG tablet, Take 1 tablet (25 mg total) by mouth 2 (two) times daily with a  meal. .  furosemide (LASIX) 80 MG tablet, Take 1 tablet (80 mg total) by mouth 2 (two) times daily. .  sacubitril-valsartan (ENTRESTO) 97-103 MG, Take 1 tablet by mouth 2 (two) times daily. .  simvastatin (ZOCOR) 20 MG tablet, TAKE 1 TABLET BY MOUTH EVERY DAY .  spironolactone (ALDACTONE) 25 MG tablet, Take 1 tablet (25 mg total) by mouth daily.     Current Outpatient Medications (Other):  .  busPIRone (BUSPAR) 15 MG tablet, Take 1 tablet (15 mg total) by mouth 3 (three) times daily. .  clonazePAM (KLONOPIN) 1 MG tablet, Take 1 tablet (1 mg total) by mouth daily as needed for anxiety. Do NOT take at bedtime. .  clotrimazole-betamethasone (LOTRISONE) cream, APPLY TO AFFECTED AREA TWICE A DAY .  potassium chloride SA (KLOR-CON M20) 20 MEQ tablet, Take 2 tablets (40 mEq total) by  mouth daily. .  sertraline (ZOLOFT) 100 MG tablet, Take 2 tablets (200 mg total) by mouth daily.   Reviewed prior external information including notes and imaging from  primary care provider As well as notes that were available from care everywhere and other healthcare systems.  Past medical history, social, surgical and family history all reviewed in electronic medical record.  No pertanent information unless stated regarding to the chief complaint.   Review of Systems:  No headache, visual changes, nausea, vomiting, diarrhea, constipation, dizziness, abdominal pain, skin rash, fevers, chills, night sweats, weight loss, swollen lymph nodes, body aches, joint swelling, chest pain, shortness of breath, mood changes. POSITIVE muscle aches  Objective  Blood pressure (!) 130/92, pulse 90, height 5\' 4"  (1.626 m), weight (!) 341 lb (154.7 kg), SpO2 96 %.   General: No apparent distress alert and oriented x3 mood and affect normal, dressed appropriately.  Morbidly obese HEENT: Pupils equal, extraocular movements intact  Respiratory: Patient's speak in full sentences and does not appear short of breath  Cardiovascular:  1+ lower extremity edema, non tender, no erythema  Gait mild antalgic Back exam: Low back exam shows the patient does have some loss of lordosis.  Difficult to assess secondary to patient's body habitus.  Negative straight leg test but worsening pain with extension of the back.     Impression and Recommendations:     The above documentation has been reviewed and is accurate and complete Lyndal Pulley, DO

## 2020-03-08 NOTE — Assessment & Plan Note (Addendum)
Contributing to the weight.  We will discuss at follow-up about possible referral to either healthy weight and wellness or the possibility to gastric bypass encouraged her to work on weight if she can and will start the aquatic therapy so less force on her back

## 2020-03-08 NOTE — Assessment & Plan Note (Signed)
Patient has had this problem for quite some time.  Do look through patient's previous imaging.  Patient does have a high anxiety that also I think is contributing.  We did discuss weight loss would be important for her.  Patient knows this but wants her back to feel better.  We discussed that due to her size at this point I would recommend aquatic physical therapy and patient was referred appropriately.  Due to patient's other comorbidities did not feel comfortable adding other medications at this time.  We did bring up the idea of gabapentin and she told me that it did not seem to work.  Patient is on very high doses of Lasix for her congestive heart failure and would need to further evaluate but would want to avoid probably chronic anti-inflammatories.  Patient will see me again in 6 to 8 weeks.  Continuing to have difficulty I would consider advanced imaging and questionable epidurals to help her with the pain.  We will also need to discuss potential gastric bypass if this continues.

## 2020-03-19 ENCOUNTER — Other Ambulatory Visit (HOSPITAL_COMMUNITY): Payer: Self-pay | Admitting: Psychiatry

## 2020-03-31 ENCOUNTER — Other Ambulatory Visit (HOSPITAL_COMMUNITY): Payer: Self-pay | Admitting: Psychiatry

## 2020-04-03 ENCOUNTER — Encounter (HOSPITAL_COMMUNITY): Payer: Self-pay | Admitting: Psychiatry

## 2020-04-03 ENCOUNTER — Other Ambulatory Visit: Payer: Self-pay

## 2020-04-03 ENCOUNTER — Telehealth (HOSPITAL_COMMUNITY): Payer: Self-pay | Admitting: Psychiatry

## 2020-04-03 ENCOUNTER — Telehealth (INDEPENDENT_AMBULATORY_CARE_PROVIDER_SITE_OTHER): Payer: Medicare Other | Admitting: Psychiatry

## 2020-04-03 DIAGNOSIS — F3341 Major depressive disorder, recurrent, in partial remission: Secondary | ICD-10-CM | POA: Diagnosis not present

## 2020-04-03 MED ORDER — BUSPIRONE HCL 15 MG PO TABS: 15.0000 mg | ORAL_TABLET | Freq: Three times a day (TID) | ORAL | 0 refills | Status: DC

## 2020-04-03 MED ORDER — SERTRALINE HCL 100 MG PO TABS
200.0000 mg | ORAL_TABLET | Freq: Every day | ORAL | 0 refills | Status: DC
Start: 2020-04-03 — End: 2020-07-11

## 2020-04-03 MED ORDER — ARIPIPRAZOLE 2 MG PO TABS: 2.0000 mg | ORAL_TABLET | Freq: Every day | ORAL | 0 refills | Status: DC

## 2020-04-03 MED ORDER — LORAZEPAM 0.5 MG PO TABS: 0.5000 mg | ORAL_TABLET | Freq: Two times a day (BID) | ORAL | 1 refills | Status: DC | PRN

## 2020-04-03 NOTE — Progress Notes (Signed)
[place] MD/PA/NP OP Progress Note  04/03/2020 11:39 AM [name]  MRN:  [id number]  Chief Complaint: Patient returns for medication management appointment  This appointment was conducted via phone. Patient's identity confirmed via two identifiers . Limitations associated with this type of communication have been reviewed .   Location of parties  Provider- outpatient behavioral health clinic Patient- home  Duration - 30 minutes   HPI: 55 year old female.  History of MDD, Anxiety Disorder ( consider GAD).   Patient reports some persistent depression and anxiety, and states it has been worsened lately mainly because her husband has travelled out of the country to visit a sick relative and she is unsure when he will return. ( She explains he also has a son from a prior relationship in [place], where he is originally from, and there is a past history of him leaving the country for extended periods of time ) . States she is angry with him for having left the country even though she asked him not to and is considering separation.   She reports some increased social isolation , partly related to [place] and partly because her husband has left, but continues to function in daily activities, and interacts regularly with her son, who is in a nearby college . Denies suicidal ideations or thoughts of dying and states " I need to be there for my son". No psychotic symptoms.    She denies medication side effects but feels that in spite of Zoloft /Buspar ( which she has been on in the past with positive  response) she continues to struggle with depression, anxiety. She is also on Klonopin 1 mgr QDAY PRN for anxiety and states that it does not help her so she has stopped taking it .        Visit Diagnosis: MDD, consider GAD versus Adjustment Disorder  Past Psychiatric History:   Past Medical History:  Past Medical History:  Diagnosis Date  . ADD (attention deficit disorder)   . Anxiety   . Arthritis     both knees right worse than left  . Carpal tunnel syndrome of right wrist   . Depression   . Essential hypertension, benign   . Gallstones   . History of smoking 06/22/2016  . Hyperlipidemia associated with type 2 diabetes mellitus ([place]), on Zocor 04/04/2011  . Hypertension associated with diabetes ([place]) 06/03/2008  . Migraines   . Morbid obesity ([place])   . Morbid obesity with BMI of 50.0-59.9, adult ([place]) 07/12/2006  . NICM (nonischemic cardiomyopathy) ([place]) 07/12/2006   01/16/18 ECHO:   - Procedure narrative: Transthoracic echocardiography. Image   quality was suboptimal. The study was technically difficult.   Intravenous contrast (Definity) was administered. - Left ventricle: The cavity size was moderately dilated. Wall   thickness was increased in a pattern of mild LVH. Systolic   function was moderately to severely reduced. The estimated   ejection fraction w  . Normal coronary arteries 04/17/2016  . OSA on CPAP 10/28/2014  . Spinal stenosis    back pain  . Spondylosis without myelopathy or radiculopathy, lumbar region 12/04/2017  . Type 2 diabetes mellitus with hyperglycemia [facility])     Past Surgical History:  Procedure Laterality Date  . CHOLECYSTECTOMY    . DILATATION & CURETTAGE/HYSTEROSCOPY WITH MYOSURE N/A 10/31/2017   Procedure: DILATATION & CURETTAGE/HYSTEROSCOPY WITH MYOSURE;  Surgeon: Salvadore Dom, MD;  Location: [facility];  Service: Gynecology;  Laterality: N/A;  . RIGHT/LEFT HEART CATH AND CORONARY ANGIOGRAPHY N/A 04/11/2016  Procedure: Right/Left Heart Cath and Coronary Angiography;  Surgeon: Burnell Blanks, MD;  Location: [facility];  Service: Cardiovascular;  Laterality: N/A;  . sonogram for blood clots     no blockages    Family Psychiatric History:   Family History:  Family History  Problem Relation Age of Onset  . Depression Mother   . Anxiety disorder Mother   . Diabetes Mother   . Hypertension Mother   . ADD / ADHD Father   . Diabetes Father    . Hypertension Father   . Hypertension Other   . Diabetes Other   . Colitis Other   . Alcohol abuse Other     Social History:  Social History   Socioeconomic History  . Marital status: Married    Spouse name: Not on file  . Number of children: Not on file  . Years of education: Not on file  . Highest education level: Not on file  Occupational History  . Not on file  Tobacco Use  . Smoking status: Former Smoker    Packs/day: 0.50    Types: Cigarettes    Quit date: 02/25/1997    Years since quitting: 23.1  . Smokeless tobacco: Never Used  . Tobacco comment: Married, lives with spouse (when he is not traveling) and son  Vaping Use  . Vaping Use: Never used  Substance and Sexual Activity  . Alcohol use: No  . Drug use: No  . Sexual activity: Yes    Partners: Male    Birth control/protection: None  Other Topics Concern  . Not on file  Social History Narrative  . Not on file   Social Determinants of Health   Financial Resource Strain: Not on file  Food Insecurity: Not on file  Transportation Needs: Not on file  Physical Activity: Not on file  Stress: Not on file  Social Connections: Not on file    Allergies:  Allergies  Allergen Reactions  . Fluoxetine Other (See Comments)    More depressed  . Cymbalta [Duloxetine Hcl] Other (See Comments)    depressed  . Penicillins     REACTION: rash Has patient had a PCN reaction causing immediate rash, facial/tongue/throat swelling, SOB or lightheadedness with hypotension: YES Has patient had a PCN reaction causing severe rash involving mucus membranes or skin necrosis: NO Has patient had a PCN reaction that required hospitalization: YES Has patient had a PCN reaction occurring within the last 10 years: NO If all of the above answers are "NO", then may proceed with Cephalosporin use.     Metabolic Disorder Labs: Lab Results  Component Value Date   HGBA1C 7.1 (A) 02/10/2020   MPG 139.85 10/29/2017   MPG 125.5  10/19/2016   MPG 125.5 10/19/2016   No results found for: PROLACTIN Lab Results  Component Value Date   CHOL 193 02/10/2020   TRIG 147.0 02/10/2020   HDL 48.90 02/10/2020   CHOLHDL 4 02/10/2020   VLDL 29.4 02/10/2020   LDLCALC 115 (H) 02/10/2020   LDLCALC 128 (H) 07/18/2016   Lab Results  Component Value Date   TSH 3.56 02/10/2020   TSH 3.08 08/25/2018    Therapeutic Level Labs: No results found for: LITHIUM No results found for: VALPROATE No components found for:  CBMZ  Current Medications: Current Outpatient Medications  Medication Sig Dispense Refill  . amLODipine (NORVASC) 5 MG tablet Take 1 tablet by mouth daily.     . busPIRone (BUSPAR) 15 MG tablet Take 1 tablet (  15 mg total) by mouth 3 (three) times daily. 90 tablet 0  . carvedilol (COREG) 12.5 MG tablet Take 1 tablet (12.5 mg total) by mouth 2 (two) times daily with a meal. 180 tablet 1  . carvedilol (COREG) 25 MG tablet Take 1 tablet (25 mg total) by mouth 2 (two) times daily with a meal. 180 tablet 1  . clonazePAM (KLONOPIN) 1 MG tablet Take 1 tablet (1 mg total) by mouth daily as needed for anxiety. Do NOT take at bedtime. 15 tablet 0  . clotrimazole-betamethasone (LOTRISONE) cream APPLY TO AFFECTED AREA TWICE A DAY 45 g 2  . Dapagliflozin-metFORMIN HCl ER (XIGDUO XR) 11-998 MG TB24 Take 1 tablet by mouth daily. 90 tablet 1  . furosemide (LASIX) 80 MG tablet Take 1 tablet (80 mg total) by mouth 2 (two) times daily. 60 tablet 11  . potassium chloride SA (KLOR-CON M20) 20 MEQ tablet Take 2 tablets (40 mEq total) by mouth daily. 180 tablet 3  . sacubitril-valsartan (ENTRESTO) 97-103 MG Take 1 tablet by mouth 2 (two) times daily. 180 tablet 3  . sertraline (ZOLOFT) 100 MG tablet Take 2 tablets (200 mg total) by mouth daily. 60 tablet 0  . simvastatin (ZOCOR) 20 MG tablet TAKE 1 TABLET BY MOUTH EVERY DAY 90 tablet 2  . spironolactone (ALDACTONE) 25 MG tablet Take 1 tablet (25 mg total) by mouth daily. 90 tablet 3    No current facility-administered medications for this visit.     Musculoskeletal:   Psychiatric Specialty Exam: please note limitations in getting a full MSE in the context of phone based appointment Review of Systems  Reports nausea,dyspnea has improved  There were no vitals taken for this visit.There is no height or weight on file to calculate BMI.  General Appearance: NA  Eye Contact:  NA  Speech:  Normal Rate  Volume:  Normal  Mood:  reports persistent depression  Affect:  congruent   Thought Process:  Linear and Descriptions of Associations: Intact  Orientation:  Full (Time, Place, and Person)  Thought Content: No hallucinations, no delusions   Suicidal Thoughts:  No - no suicidal ideations, identifies son /family as protective factor  Homicidal Thoughts:  No  Memory:  Recent and remote grossly intact  Judgement:  Present   Insight:  Present   Psychomotor Activity:  NA  Concentration:  Concentration: Good and Attention Span: Good  Recall:  Good  Fund of Knowledge: Good  Language: Good  Akathisia:  Negative  Handed:  Right  AIMS (if indicated):   Assets:  Communication Skills Desire for Improvement Resilience  ADL's:  Intact  Cognition: WNL  Sleep:  Good   Screenings: AUDIT   Flowsheet Row Admission (Discharged) from 12/05/2013 in [place] [facility]  Alcohol Use Disorder Identification Test Final Score (AUDIT) 0    PHQ2-9   Flowsheet Row Office Visit from 02/10/2020 in [place] [facility] Visit from 11/07/2017 in [place] Visit from 10/18/2016 in [place] from 06/26/2016 in [place] Visit from 07/19/2015 in [facility] at [facility] Total Score 0 2 5 4 2   PHQ-9 Total Score 0 11 8 21 5        Assessment and Plan:   She reports persistent depression, anxiety, and states  it has increased after her husband left the country to visit a sick relative even though she had asked him not  to , and is unsure when he might return .  Denies suicidal ideations, identifies son as protective factor against considering self harm. Denies medication side effects, and is tolerating Zoloft /Buspar well. She has been on these meds in the past at which time she improved , but states that they seem less effective currently . With regards to Klonopin PRN, states she feels it is not working for anxiety and has stopped taking it . Denies any BZD misuse or abuse .  We discussed treatment options, and agreed on below   Continue  Zoloft  200  mgr QDAY, Buspar 15 mgr TID, add Abilify 2 mgr QDAY for antidepressant augmentation . Side effects reviewed   D/C  Klonopin PRN ( has already stopped taking it)  and start Ativan 0.5 mgr BID PRN for anxiety.   I have continued to encourage  her to establish individual psychotherapy and will refer to IOP for added support    Will see in 3-4 weeks , agrees to contact clinic sooner if any worsening prior .       Jenne Campus, MD 04/03/2020, 11:39 AM

## 2020-04-04 ENCOUNTER — Telehealth (HOSPITAL_COMMUNITY): Payer: Self-pay | Admitting: *Deleted

## 2020-04-04 NOTE — Telephone Encounter (Signed)
Opened in error

## 2020-04-26 ENCOUNTER — Ambulatory Visit: Payer: Medicare Other | Admitting: Family Medicine

## 2020-05-01 ENCOUNTER — Telehealth (INDEPENDENT_AMBULATORY_CARE_PROVIDER_SITE_OTHER): Payer: Medicare Other | Admitting: Psychiatry

## 2020-05-01 ENCOUNTER — Encounter (HOSPITAL_COMMUNITY): Payer: Self-pay | Admitting: Psychiatry

## 2020-05-01 ENCOUNTER — Other Ambulatory Visit: Payer: Self-pay

## 2020-05-01 DIAGNOSIS — F3340 Major depressive disorder, recurrent, in remission, unspecified: Secondary | ICD-10-CM

## 2020-05-01 NOTE — Progress Notes (Signed)
BH MD/PA/NP OP Progress Note  05/01/2020 11:44 AM [name]  MRN:  [id number]  Chief Complaint: Patient returns for medication management appointment  This appointment was conducted via phone. Patient's identity confirmed via two identifiers . Limitations associated with this type of communication have been reviewed .   Location of parties  Provider- outpatient behavioral health clinic Patient- home  Duration - 35 minutes   HPI: 55 year old female.  History of MDD, Anxiety Disorder ( consider GAD).   [name] reports she continues to feel depressed and anxious. In brief, her husband has a history of leaving the country to go to his country of origin for relatively extended peers of time during which she is left alone.  She states that he recently left with very little notice and she does not know when he might return.  He has a child in his country of origin and she suspects that he may have 2 families.  She states that being alone is stressful and anxiety provoking partly because she has chronic medical illnesses which are debilitating and make daily functioning more difficult.  She states "when he is here he is a wonderful husband but I cannot take this anymore" and she is considering separating/divorcing when he returns. She describes feeling depressed, sad, at times does not leave her home for a period of a day or more.  Currently her son is home from college and she is focusing on him, driving him places and spending time with him.  She denies current suicidal ideations and identifies her love for her son as a protective factor but states "if it were not for him I do not know".  She is tearful during her description of the above stressors.  She also states she is ashamed to talk about them with people she may not know which has made her be more reluctant to start psychotherapy. Insofar as medications she has been taking them regularly.  She states that she has not yet picked up the most  recent prescriptions and therefore has not yet started Abilify or switched to low-dose Ativan.  She denies medication side effects.  She responds to support, encouragement and identification of equal strength/coping skills.  No psychotic symptoms.      Visit Diagnosis: MDD, consider GAD versus Adjustment Disorder  Past Psychiatric History:   Past Medical History:  Past Medical History:  Diagnosis Date  . ADD (attention deficit disorder)   . Anxiety   . Arthritis    both knees right worse than left  . Carpal tunnel syndrome of right wrist   . Depression   . Essential hypertension, benign   . Gallstones   . History of smoking 06/22/2016  . Hyperlipidemia associated with type 2 diabetes mellitus ([place]), on Zocor 04/04/2011  . Hypertension associated with diabetes ([place]) 06/03/2008  . Migraines   . Morbid obesity ([place])   . Morbid obesity with BMI of 50.0-59.9, adult ([place]) 07/12/2006  . NICM (nonischemic cardiomyopathy) ([place]) 07/12/2006   01/16/18 ECHO:   - Procedure narrative: Transthoracic echocardiography. Image   quality was suboptimal. The study was technically difficult.   Intravenous contrast (Definity) was administered. - Left ventricle: The cavity size was moderately dilated. Wall   thickness was increased in a pattern of mild LVH. Systolic   function was moderately to severely reduced. The estimated   ejection fraction w  . Normal coronary arteries 04/17/2016  . OSA on CPAP 10/28/2014  . Spinal stenosis    back pain  .  Spondylosis without myelopathy or radiculopathy, lumbar region 12/04/2017  . Type 2 diabetes mellitus with hyperglycemia [facility])     Past Surgical History:  Procedure Laterality Date  . CHOLECYSTECTOMY    . DILATATION & CURETTAGE/HYSTEROSCOPY WITH MYOSURE N/A 10/31/2017   Procedure: DILATATION & CURETTAGE/HYSTEROSCOPY WITH MYOSURE;  Surgeon: Salvadore Dom, MD;  Location: [facility];  Service: Gynecology;  Laterality: N/A;  . RIGHT/LEFT HEART CATH AND CORONARY  ANGIOGRAPHY N/A 04/11/2016   Procedure: Right/Left Heart Cath and Coronary Angiography;  Surgeon: Burnell Blanks, MD;  Location: [facility];  Service: Cardiovascular;  Laterality: N/A;  . sonogram for blood clots     no blockages    Family Psychiatric History:   Family History:  Family History  Problem Relation Age of Onset  . Depression Mother   . Anxiety disorder Mother   . Diabetes Mother   . Hypertension Mother   . ADD / ADHD Father   . Diabetes Father   . Hypertension Father   . Hypertension Other   . Diabetes Other   . Colitis Other   . Alcohol abuse Other     Social History:  Social History   Socioeconomic History  . Marital status: Married    Spouse name: Not on file  . Number of children: Not on file  . Years of education: Not on file  . Highest education level: Not on file  Occupational History  . Not on file  Tobacco Use  . Smoking status: Former Smoker    Packs/day: 0.50    Types: Cigarettes    Quit date: 02/25/1997    Years since quitting: 23.1  . Smokeless tobacco: Never Used  . Tobacco comment: Married, lives with spouse (when he is not traveling) and son  Vaping Use  . Vaping Use: Never used  Substance and Sexual Activity  . Alcohol use: No  . Drug use: No  . Sexual activity: Yes    Partners: Male    Birth control/protection: None  Other Topics Concern  . Not on file  Social History Narrative  . Not on file   Social Determinants of Health   Financial Resource Strain: Not on file  Food Insecurity: Not on file  Transportation Needs: Not on file  Physical Activity: Not on file  Stress: Not on file  Social Connections: Not on file    Allergies:  Allergies  Allergen Reactions  . Fluoxetine Other (See Comments)    More depressed  . Cymbalta [Duloxetine Hcl] Other (See Comments)    depressed  . Penicillins     REACTION: rash Has patient had a PCN reaction causing immediate rash, facial/tongue/throat swelling, SOB or  lightheadedness with hypotension: YES Has patient had a PCN reaction causing severe rash involving mucus membranes or skin necrosis: NO Has patient had a PCN reaction that required hospitalization: YES Has patient had a PCN reaction occurring within the last 10 years: NO If all of the above answers are "NO", then may proceed with Cephalosporin use.     Metabolic Disorder Labs: Lab Results  Component Value Date   HGBA1C 7.1 (A) 02/10/2020   MPG 139.85 10/29/2017   MPG 125.5 10/19/2016   MPG 125.5 10/19/2016   No results found for: PROLACTIN Lab Results  Component Value Date   CHOL 193 02/10/2020   TRIG 147.0 02/10/2020   HDL 48.90 02/10/2020   CHOLHDL 4 02/10/2020   VLDL 29.4 02/10/2020   LDLCALC 115 (H) 02/10/2020   LDLCALC 128 (H)  07/18/2016   Lab Results  Component Value Date   TSH 3.56 02/10/2020   TSH 3.08 08/25/2018    Therapeutic Level Labs: No results found for: LITHIUM No results found for: VALPROATE No components found for:  CBMZ  Current Medications: Current Outpatient Medications  Medication Sig Dispense Refill  . amLODipine (NORVASC) 5 MG tablet Take 1 tablet by mouth daily.     . ARIPiprazole (ABILIFY) 2 MG tablet Take 1 tablet (2 mg total) by mouth daily. 30 tablet 0  . busPIRone (BUSPAR) 15 MG tablet Take 1 tablet (15 mg total) by mouth 3 (three) times daily. 90 tablet 0  . carvedilol (COREG) 12.5 MG tablet Take 1 tablet (12.5 mg total) by mouth 2 (two) times daily with a meal. 180 tablet 1  . carvedilol (COREG) 25 MG tablet Take 1 tablet (25 mg total) by mouth 2 (two) times daily with a meal. 180 tablet 1  . clotrimazole-betamethasone (LOTRISONE) cream APPLY TO AFFECTED AREA TWICE A DAY 45 g 2  . Dapagliflozin-metFORMIN HCl ER (XIGDUO XR) 11-998 MG TB24 Take 1 tablet by mouth daily. 90 tablet 1  . furosemide (LASIX) 80 MG tablet Take 1 tablet (80 mg total) by mouth 2 (two) times daily. 60 tablet 11  . LORazepam (ATIVAN) 0.5 MG tablet Take 1 tablet  (0.5 mg total) by mouth 2 (two) times daily as needed for anxiety. 30 tablet 1  . potassium chloride SA (KLOR-CON M20) 20 MEQ tablet Take 2 tablets (40 mEq total) by mouth daily. 180 tablet 3  . sacubitril-valsartan (ENTRESTO) 97-103 MG Take 1 tablet by mouth 2 (two) times daily. 180 tablet 3  . sertraline (ZOLOFT) 100 MG tablet Take 2 tablets (200 mg total) by mouth daily. 60 tablet 0  . simvastatin (ZOCOR) 20 MG tablet TAKE 1 TABLET BY MOUTH EVERY DAY 90 tablet 2  . spironolactone (ALDACTONE) 25 MG tablet Take 1 tablet (25 mg total) by mouth daily. 90 tablet 3   No current facility-administered medications for this visit.     Musculoskeletal:   Psychiatric Specialty Exam: please note limitations in getting a full MSE in the context of phone based appointment Review of Systems dyspnea on exertion  There were no vitals taken for this visit.There is no height or weight on file to calculate BMI.  General Appearance: NA  Eye Contact:  NA  Speech:  Normal Rate  Volume:  Normal  Mood: Reports she continues to feel depressed which she attributes to her marital stressors as above  Affect:  congruent , Tearful at times  Thought Process:  Linear and Descriptions of Associations: Intact  Orientation:  Full (Time, Place, and Person)  Thought Content: No hallucinations, no delusions   Suicidal Thoughts:  No - no suicidal ideations, identifies son Regis Bill as protective factor  Homicidal Thoughts:  No  Memory:  Recent and remote grossly intact  Judgement:  Present   Insight:  Present   Psychomotor Activity:  NA  Concentration:  Concentration: Good and Attention Span: Good  Recall:  Good  Fund of Knowledge: Good  Language: Good  Akathisia:  Negative  Handed:  Right  AIMS (if indicated):   Assets:  Communication Skills Desire for Improvement Resilience  ADL's:  Intact  Cognition: WNL  Sleep:  Good   Screenings: AUDIT   Flowsheet Row Admission (Discharged) from 12/05/2013 in [facility]  Alcohol Use Disorder Identification Test Final Score (AUDIT) 0    PHQ2-9   Flowsheet Row  Office Visit from 02/10/2020 in [place] at [facility] Visit from 11/07/2017 in [place] Visit from 10/18/2016 in [facility] from 06/26/2016 in [place] from 07/19/2015 in [facility] at [facility] Total Score 0 2 5 4 2   PHQ-9 Total Score 0 11 8 21 5        Assessment and Plan:   Patient presents depressed, anxious, ruminative about her significant marital stressors.  As described, her husband has a history of traveling back to his country of origin for long periods  of time without giving her time to prepare/little notice.  He has a child in his country of origin and she states she is suspicious he might actually have 2 families.  She is currently denying suicidal ideations and identifies her love for her son as a protective factor.  No psychotic symptoms are noted or endorsed.  She is tolerating medications well but states she has not yet picked up her prescription from our last appointment, meaning she has not yet started Abilify, which had been started as an augmentation strategy to address depression.  She is ambivalent and concerned about psychotherapy, partly due to subjective feeling of shame regarding the above stressors.  I have provided reassurance, support and have strongly encouraged her to consider psychotherapy.  We will also refer her to the IOP or PHP depending on availability.  We will see her in 3 to 4 weeks.  She agrees to contact clinic sooner should there be any worsening or concern prior.  She agrees to pick up her prescriptions (see above)  Zoloft 200 mg daily, BuSpar 15 mg 3 times daily, Abilify 2 mg daily for antidepressant augmentation, discontinue Klonopin as needed which she has  already stopped taking and start Ativan 0.5 mg twice daily as needed for anxiety.      Jenne Campus, MD 05/01/2020, 11:44 AM

## 2020-05-03 ENCOUNTER — Telehealth (HOSPITAL_COMMUNITY): Payer: Self-pay | Admitting: Psychiatry

## 2020-05-03 NOTE — Telephone Encounter (Signed)
D:  Dr. Parke Poisson referred pt to [facility].  A:  Placed call to orient pt and provide her with a start date.  Pt is declining MH-IOP.  "My situation is different and I don't want to discuss my issues in a group situation.  I really wish that Dr. Parke Poisson was a psychologist."  When case manager recommended an individual therapist; pt declined; stating that she can't find the right fit."  Case manager redirected pt back to Dr. Parke Poisson and Alison Murray, LPN.  R:  Pt receptive.

## 2020-05-09 ENCOUNTER — Ambulatory Visit: Payer: Medicare Other | Admitting: Physical Therapy

## 2020-05-10 ENCOUNTER — Ambulatory Visit: Payer: Medicare Other | Admitting: Internal Medicine

## 2020-05-11 ENCOUNTER — Telehealth: Payer: Self-pay | Admitting: Internal Medicine

## 2020-05-11 NOTE — Telephone Encounter (Signed)
LVM for pt to rtn my call to schedule AWV with [facility]. Please schedule awv if pt calls the office.  

## 2020-05-16 ENCOUNTER — Telehealth (INDEPENDENT_AMBULATORY_CARE_PROVIDER_SITE_OTHER): Payer: Medicare Other | Admitting: Psychiatry

## 2020-05-16 ENCOUNTER — Other Ambulatory Visit: Payer: Self-pay

## 2020-05-16 DIAGNOSIS — F331 Major depressive disorder, recurrent, moderate: Secondary | ICD-10-CM | POA: Diagnosis not present

## 2020-05-16 MED ORDER — LORAZEPAM 0.5 MG PO TABS: 0.5000 mg | ORAL_TABLET | Freq: Two times a day (BID) | ORAL | 1 refills | Status: DC | PRN

## 2020-05-16 NOTE — Progress Notes (Signed)
BH MD/PA/NP OP Progress Note  05/16/2020 11:19 AM [name]  MRN:  [id number]  Chief Complaint: Patient returns for medication management appointment  This appointment was conducted via phone. Patient's identity confirmed via two identifiers . Limitations associated with this type of communication have been reviewed .   Location of parties  Provider- outpatient behavioral health clinic Patient- home  Duration - 35 minutes   HPI: 55 year old female.  History of MDD, Anxiety Disorder ( consider GAD).   She continues to report depression and anxiety.  Attributes this mainly to marital issues and limited support. In brief, she has been married for 20+ years.  Her husband is originally from [place].  He has a history of at times leaving to go to [place] with little or no notice to patient.  A few weeks ago he left again and she states she does not know when he will come back.  She states that she is dependent on him not only for emotional support but also for daily activities such as for example upkeep in their apartment, changing light bulbs and fixtures, maintaining their car, and similar.  She states that because of her chronic medical illnesses and mobility issues it is hard for her to do these activities on her own.  Also she states she feels frustrated and embarrassed that she is married to "someone who can just take off like that".  She states she considers separation or divorce but on the other hand recognize that when he is home he is very supportive.  She reports ongoing symptoms of depression such as crying often when ruminating about the above stressors, low energy level, some anhedonia.  She has had intermittent thoughts of "giving up" but denies suicidal plan or intention and she identifies love for her son as a protective factor.  She has a 9 year old who is currently in college but lives with her.  He does not drive and she drives and from and to college, and states she realizes  it is her " duty as a mother" to be there for him.  She also has 1 good friend whom she talks to a couple times a week.  States that her friend has offered her to stay with her for a few days for support, and has encouraged her to visit her more often, which patient has not done yet.  Of note, we had referred her to partial hospital program or intensive outpatient program for added support but patient states she is not interested in this treatment modality mainly because of group therapies.  States "I do not like to share my dirty laundry in front of people that I do not know".   She states that she has not recently picked up her psychiatric medications and therefore has not taken any for the last several days.  She denies side effects.     Visit Diagnosis: MDD, consider GAD versus Adjustment Disorder  Past Psychiatric History:   Past Medical History:  Past Medical History:  Diagnosis Date  . ADD (attention deficit disorder)   . Anxiety   . Arthritis    both knees right worse than left  . Carpal tunnel syndrome of right wrist   . Depression   . Essential hypertension, benign   . Gallstones   . History of smoking 06/22/2016  . Hyperlipidemia associated with type 2 diabetes mellitus ([place]), on Zocor 04/04/2011  . Hypertension associated with diabetes ([place]) 06/03/2008  . Migraines   . Morbid obesity ([place])   .  Morbid obesity with BMI of 50.0-59.9, adult ([place]) 07/12/2006  . NICM (nonischemic cardiomyopathy) ([place]) 07/12/2006   01/16/18 ECHO:   - Procedure narrative: Transthoracic echocardiography. Image   quality was suboptimal. The study was technically difficult.   Intravenous contrast (Definity) was administered. - Left ventricle: The cavity size was moderately dilated. Wall   thickness was increased in a pattern of mild LVH. Systolic   function was moderately to severely reduced. The estimated   ejection fraction w  . Normal coronary arteries 04/17/2016  . OSA on CPAP 10/28/2014  . Spinal  stenosis    back pain  . Spondylosis without myelopathy or radiculopathy, lumbar region 12/04/2017  . Type 2 diabetes mellitus with hyperglycemia [facility])     Past Surgical History:  Procedure Laterality Date  . CHOLECYSTECTOMY    . DILATATION & CURETTAGE/HYSTEROSCOPY WITH MYOSURE N/A 10/31/2017   Procedure: DILATATION & CURETTAGE/HYSTEROSCOPY WITH MYOSURE;  Surgeon: Salvadore Dom, MD;  Location: [facility];  Service: Gynecology;  Laterality: N/A;  . RIGHT/LEFT HEART CATH AND CORONARY ANGIOGRAPHY N/A 04/11/2016   Procedure: Right/Left Heart Cath and Coronary Angiography;  Surgeon: Burnell Blanks, MD;  Location: [facility];  Service: Cardiovascular;  Laterality: N/A;  . sonogram for blood clots     no blockages    Family Psychiatric History:   Family History:  Family History  Problem Relation Age of Onset  . Depression Mother   . Anxiety disorder Mother   . Diabetes Mother   . Hypertension Mother   . ADD / ADHD Father   . Diabetes Father   . Hypertension Father   . Hypertension Other   . Diabetes Other   . Colitis Other   . Alcohol abuse Other     Social History:  Social History   Socioeconomic History  . Marital status: Married    Spouse name: Not on file  . Number of children: Not on file  . Years of education: Not on file  . Highest education level: Not on file  Occupational History  . Not on file  Tobacco Use  . Smoking status: Former Smoker    Packs/day: 0.50    Types: Cigarettes    Quit date: 02/25/1997    Years since quitting: 23.2  . Smokeless tobacco: Never Used  . Tobacco comment: Married, lives with spouse (when he is not traveling) and son  Vaping Use  . Vaping Use: Never used  Substance and Sexual Activity  . Alcohol use: No  . Drug use: No  . Sexual activity: Yes    Partners: Male    Birth control/protection: None  Other Topics Concern  . Not on file  Social History Narrative  . Not on file   Social Determinants of Health    Financial Resource Strain: Not on file  Food Insecurity: Not on file  Transportation Needs: Not on file  Physical Activity: Not on file  Stress: Not on file  Social Connections: Not on file    Allergies:  Allergies  Allergen Reactions  . Fluoxetine Other (See Comments)    More depressed  . Cymbalta [Duloxetine Hcl] Other (See Comments)    depressed  . Penicillins     REACTION: rash Has patient had a PCN reaction causing immediate rash, facial/tongue/throat swelling, SOB or lightheadedness with hypotension: YES Has patient had a PCN reaction causing severe rash involving mucus membranes or skin necrosis: NO Has patient had a PCN reaction that required hospitalization: YES Has patient had a PCN  reaction occurring within the last 10 years: NO If all of the above answers are "NO", then may proceed with Cephalosporin use.     Metabolic Disorder Labs: Lab Results  Component Value Date   HGBA1C 7.1 (A) 02/10/2020   MPG 139.85 10/29/2017   MPG 125.5 10/19/2016   MPG 125.5 10/19/2016   No results found for: PROLACTIN Lab Results  Component Value Date   CHOL 193 02/10/2020   TRIG 147.0 02/10/2020   HDL 48.90 02/10/2020   CHOLHDL 4 02/10/2020   VLDL 29.4 02/10/2020   LDLCALC 115 (H) 02/10/2020   LDLCALC 128 (H) 07/18/2016   Lab Results  Component Value Date   TSH 3.56 02/10/2020   TSH 3.08 08/25/2018    Therapeutic Level Labs: No results found for: LITHIUM No results found for: VALPROATE No components found for:  CBMZ  Current Medications: Current Outpatient Medications  Medication Sig Dispense Refill  . amLODipine (NORVASC) 5 MG tablet Take 1 tablet by mouth daily.     . ARIPiprazole (ABILIFY) 2 MG tablet Take 1 tablet (2 mg total) by mouth daily. 30 tablet 0  . busPIRone (BUSPAR) 15 MG tablet Take 1 tablet (15 mg total) by mouth 3 (three) times daily. 90 tablet 0  . carvedilol (COREG) 12.5 MG tablet Take 1 tablet (12.5 mg total) by mouth 2 (two) times daily  with a meal. 180 tablet 1  . carvedilol (COREG) 25 MG tablet Take 1 tablet (25 mg total) by mouth 2 (two) times daily with a meal. 180 tablet 1  . clotrimazole-betamethasone (LOTRISONE) cream APPLY TO AFFECTED AREA TWICE A DAY 45 g 2  . Dapagliflozin-metFORMIN HCl ER (XIGDUO XR) 11-998 MG TB24 Take 1 tablet by mouth daily. 90 tablet 1  . furosemide (LASIX) 80 MG tablet Take 1 tablet (80 mg total) by mouth 2 (two) times daily. 60 tablet 11  . LORazepam (ATIVAN) 0.5 MG tablet Take 1 tablet (0.5 mg total) by mouth 2 (two) times daily as needed for anxiety. 30 tablet 1  . potassium chloride SA (KLOR-CON M20) 20 MEQ tablet Take 2 tablets (40 mEq total) by mouth daily. 180 tablet 3  . sacubitril-valsartan (ENTRESTO) 97-103 MG Take 1 tablet by mouth 2 (two) times daily. 180 tablet 3  . sertraline (ZOLOFT) 100 MG tablet Take 2 tablets (200 mg total) by mouth daily. 60 tablet 0  . simvastatin (ZOCOR) 20 MG tablet TAKE 1 TABLET BY MOUTH EVERY DAY 90 tablet 2  . spironolactone (ALDACTONE) 25 MG tablet Take 1 tablet (25 mg total) by mouth daily. 90 tablet 3   No current facility-administered medications for this visit.     Musculoskeletal:   Psychiatric Specialty Exam: please note limitations in getting a full MSE in the context of phone based appointment Review of Systems dyspnea on exertion  There were no vitals taken for this visit.There is no height or weight on file to calculate BMI.  General Appearance: NA  Eye Contact:  NA  Speech:  Normal Rate  Volume:  Normal  Mood: Reports ongoing depression which she attributes to marital issues as described above  Affect:  congruent , Tearful at times  Thought Process:  Linear and Descriptions of Associations: Intact  Orientation:  Full (Time, Place, and Person)  Thought Content: No hallucinations, no delusions   Suicidal Thoughts:  No - no suicidal plan or intention, identifies son Regis Bill as protective factor  Homicidal Thoughts:  No  Memory:   Recent and remote grossly intact  Judgement:  Present   Insight:  Present   Psychomotor Activity:  NA  Concentration:  Concentration: Good and Attention Span: Good  Recall:  Good  Fund of Knowledge: Good  Language: Good  Akathisia:  Negative  Handed:  Right  AIMS (if indicated):   Assets:  Communication Skills Desire for Improvement Resilience  ADL's:  Intact  Cognition: WNL  Sleep:  Good   Screenings: AUDIT   Flowsheet Row Admission (Discharged) from 12/05/2013 in [facility]  Alcohol Use Disorder Identification Test Final Score (AUDIT) 0    PHQ2-9   Flowsheet Row Office Visit from 02/10/2020 in [facility] Visit from 11/07/2017 in [facility] Visit from 10/18/2016 in [facility] from 06/26/2016 in [place] from 07/19/2015 in [facility] Total Score 0 2 5 4 2   PHQ-9 Total Score 0 11 8 21 5        Assessment and Plan:   She remains depressed and endorses some persistent neurovegetative symptoms.  She attributes these mainly to ongoing marital issues.  Her husband is again out of the country and patient states she has been unable to ascertain when he will be returning.  States that without him, and due to her mobility issues, daily activities such as maintaining her vehicle, changing light bulbs and up keeping in her apartment are challenging.  She also struggles with/is ambivalent about whether she should separate from him or not.  Responds well to support, encouragement and identification of equal strengths and coping skills that she has.  She denies suicidal ideations and states that "I have to be there for my son" who lives with her. Of note she has not taken her psychiatric medications over the last several days because she has not yet gone to the pharmacy to pick  up the prescription.  Denies side effects.  We have reviewed importance of medication compliance. Patient not interested in IOP or partial hospital program at this time.  I have encouraged her to consider individual psychotherapy in addition to medication management.  She states she will consider this option.  I will ask front desk to provide her with referrals to initiate individual psychotherapy.  She is agreeing to go to the ED should there be any significant worsening or any emergence of suicidal ideations.  I will see her again in about 10 days she agrees to contact clinic sooner should there be any worsening prior.  No medication changes at this time      Jenne Campus, MD 05/16/2020, 11:19 AM

## 2020-05-23 ENCOUNTER — Ambulatory Visit (INDEPENDENT_AMBULATORY_CARE_PROVIDER_SITE_OTHER): Payer: Medicare Other | Admitting: Licensed Clinical Social Worker

## 2020-05-23 ENCOUNTER — Other Ambulatory Visit: Payer: Self-pay

## 2020-05-23 DIAGNOSIS — F988 Other specified behavioral and emotional disorders with onset usually occurring in childhood and adolescence: Secondary | ICD-10-CM | POA: Diagnosis not present

## 2020-05-23 DIAGNOSIS — F332 Major depressive disorder, recurrent severe without psychotic features: Secondary | ICD-10-CM
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
## 2020-05-29 ENCOUNTER — Telehealth (INDEPENDENT_AMBULATORY_CARE_PROVIDER_SITE_OTHER): Payer: Medicare Other | Admitting: Psychiatry

## 2020-05-29 ENCOUNTER — Other Ambulatory Visit: Payer: Self-pay

## 2020-05-29 DIAGNOSIS — F3341 Major depressive disorder, recurrent, in partial remission: Secondary | ICD-10-CM

## 2020-05-29 NOTE — Progress Notes (Signed)
[place] MD/PA/NP OP Progress Note  05/29/2020 2:50 PM [name]  MRN:  [id number]  Chief Complaint: Patient returns for medication management appointment  This appointment was conducted via phone. Patient's identity confirmed via two identifiers . Limitations associated with this type of communication have been reviewed .   Location of parties  Provider- outpatient behavioral health clinic Patient- home  Duration - 35 minutes   HPI: 55 year old female.  History of MDD, Anxiety Disorder ( consider GAD).   She continues to report depression, anxiety related in part to family stressors , mainly her husband having left to go overseas without informing her . States she did finally hear from him - he called last week. States they spoke and he told her he had [place] which had delayed his projected return to the [place] and that he was tending to a medically ill brother .  She states she is unsure if she can believe him and thinks that he probably has another woman out of the country ( she does know he has a son ) but states she thinks his phone call means he is returning soon.  She states she is angry with him and that she no longer trusts him but that when he is at home he provides , treats her well ( denies any domestic violence ) and helps with maintenance of the home. States for example that she has been trying to deal with a leak which has ruined a carpet and that she cannot do this until he returns . She states she is considering separating from him after he returns. She did start individual psychotherapy recently, and states she feels it helped  " to be able to talk about stuff" and also states she has a good friend who has been very supportive and visited her regularly . She states " but I am embarrassed to talk about this stuff ( referring to above marital issues) , I shouldn't be, it is him who should be embarrassed ". With regards to medications she states she has not had time to go pick up scripts  so that she has not been taking her psychiatric medications recently. States that she had some left and decided to restart but they have been put into a box together so that " I could not tell them apart ". I cautioned her NOT to take any medication she cannot clearly identify and to ONLY take medications that are clearly identified and at doses prescribed. She expressed understanding.     Compared to last session, she acknowledges some improvement and her affect appeared better as well, and at times laughed during session appropriately . Denies SI. No psychotic symptoms.     Visit Diagnosis: MDD, consider GAD versus Adjustment Disorder  Past Psychiatric History:   Past Medical History:  Past Medical History:  Diagnosis Date  . ADD (attention deficit disorder)   . Anxiety   . Arthritis    both knees right worse than left  . Carpal tunnel syndrome of right wrist   . Depression   . Essential hypertension, benign   . Gallstones   . History of smoking 06/22/2016  . Hyperlipidemia associated with type 2 diabetes mellitus ([place]), on Zocor 04/04/2011  . Hypertension associated with diabetes ([place]) 06/03/2008  . Migraines   . Morbid obesity ([place])   . Morbid obesity with BMI of 50.0-59.9, adult ([place]) 07/12/2006  . NICM (nonischemic cardiomyopathy) ([place]) 07/12/2006   01/16/18 ECHO:   - Procedure narrative: Transthoracic echocardiography.  Image   quality was suboptimal. The study was technically difficult.   Intravenous contrast (Definity) was administered. - Left ventricle: The cavity size was moderately dilated. Wall   thickness was increased in a pattern of mild LVH. Systolic   function was moderately to severely reduced. The estimated   ejection fraction w  . Normal coronary arteries 04/17/2016  . OSA on CPAP 10/28/2014  . Spinal stenosis    back pain  . Spondylosis without myelopathy or radiculopathy, lumbar region 12/04/2017  . Type 2 diabetes mellitus with hyperglycemia [facility])     Past Surgical  History:  Procedure Laterality Date  . CHOLECYSTECTOMY    . DILATATION & CURETTAGE/HYSTEROSCOPY WITH MYOSURE N/A 10/31/2017   Procedure: DILATATION & CURETTAGE/HYSTEROSCOPY WITH MYOSURE;  Surgeon: Salvadore Dom, MD;  Location: [facility];  Service: Gynecology;  Laterality: N/A;  . RIGHT/LEFT HEART CATH AND CORONARY ANGIOGRAPHY N/A 04/11/2016   Procedure: Right/Left Heart Cath and Coronary Angiography;  Surgeon: Burnell Blanks, MD;  Location: [facility];  Service: Cardiovascular;  Laterality: N/A;  . sonogram for blood clots     no blockages    Family Psychiatric History:   Family History:  Family History  Problem Relation Age of Onset  . Depression Mother   . Anxiety disorder Mother   . Diabetes Mother   . Hypertension Mother   . ADD / ADHD Father   . Diabetes Father   . Hypertension Father   . Hypertension Other   . Diabetes Other   . Colitis Other   . Alcohol abuse Other     Social History:  Social History   Socioeconomic History  . Marital status: Married    Spouse name: Not on file  . Number of children: Not on file  . Years of education: Not on file  . Highest education level: Not on file  Occupational History  . Not on file  Tobacco Use  . Smoking status: Former Smoker    Packs/day: 0.50    Types: Cigarettes    Quit date: 02/25/1997    Years since quitting: 23.2  . Smokeless tobacco: Never Used  . Tobacco comment: Married, lives with spouse (when he is not traveling) and son  Vaping Use  . Vaping Use: Never used  Substance and Sexual Activity  . Alcohol use: No  . Drug use: No  . Sexual activity: Yes    Partners: Male    Birth control/protection: None  Other Topics Concern  . Not on file  Social History Narrative  . Not on file   Social Determinants of Health   Financial Resource Strain: Not on file  Food Insecurity: Not on file  Transportation Needs: Not on file  Physical Activity: Not on file  Stress: Not on file  Social  Connections: Not on file    Allergies:  Allergies  Allergen Reactions  . Fluoxetine Other (See Comments)    More depressed  . Cymbalta [Duloxetine Hcl] Other (See Comments)    depressed  . Penicillins     REACTION: rash Has patient had a PCN reaction causing immediate rash, facial/tongue/throat swelling, SOB or lightheadedness with hypotension: YES Has patient had a PCN reaction causing severe rash involving mucus membranes or skin necrosis: NO Has patient had a PCN reaction that required hospitalization: YES Has patient had a PCN reaction occurring within the last 10 years: NO If all of the above answers are "NO", then may proceed with Cephalosporin use.     Metabolic  Disorder Labs: Lab Results  Component Value Date   HGBA1C 7.1 (A) 02/10/2020   MPG 139.85 10/29/2017   MPG 125.5 10/19/2016   MPG 125.5 10/19/2016   No results found for: PROLACTIN Lab Results  Component Value Date   CHOL 193 02/10/2020   TRIG 147.0 02/10/2020   HDL 48.90 02/10/2020   CHOLHDL 4 02/10/2020   VLDL 29.4 02/10/2020   LDLCALC 115 (H) 02/10/2020   LDLCALC 128 (H) 07/18/2016   Lab Results  Component Value Date   TSH 3.56 02/10/2020   TSH 3.08 08/25/2018    Therapeutic Level Labs: No results found for: LITHIUM No results found for: VALPROATE No components found for:  CBMZ  Current Medications: Current Outpatient Medications  Medication Sig Dispense Refill  . amLODipine (NORVASC) 5 MG tablet Take 1 tablet by mouth daily.     . ARIPiprazole (ABILIFY) 2 MG tablet Take 1 tablet (2 mg total) by mouth daily. 30 tablet 0  . busPIRone (BUSPAR) 15 MG tablet Take 1 tablet (15 mg total) by mouth 3 (three) times daily. 90 tablet 0  . carvedilol (COREG) 12.5 MG tablet Take 1 tablet (12.5 mg total) by mouth 2 (two) times daily with a meal. 180 tablet 1  . carvedilol (COREG) 25 MG tablet Take 1 tablet (25 mg total) by mouth 2 (two) times daily with a meal. 180 tablet 1  . clotrimazole-betamethasone  (LOTRISONE) cream APPLY TO AFFECTED AREA TWICE A DAY 45 g 2  . Dapagliflozin-metFORMIN HCl ER (XIGDUO XR) 11-998 MG TB24 Take 1 tablet by mouth daily. 90 tablet 1  . furosemide (LASIX) 80 MG tablet Take 1 tablet (80 mg total) by mouth 2 (two) times daily. 60 tablet 11  . LORazepam (ATIVAN) 0.5 MG tablet Take 1 tablet (0.5 mg total) by mouth 2 (two) times daily as needed for anxiety. 10 tablet 1  . potassium chloride SA (KLOR-CON M20) 20 MEQ tablet Take 2 tablets (40 mEq total) by mouth daily. 180 tablet 3  . sacubitril-valsartan (ENTRESTO) 97-103 MG Take 1 tablet by mouth 2 (two) times daily. 180 tablet 3  . sertraline (ZOLOFT) 100 MG tablet Take 2 tablets (200 mg total) by mouth daily. 60 tablet 0  . simvastatin (ZOCOR) 20 MG tablet TAKE 1 TABLET BY MOUTH EVERY DAY 90 tablet 2  . spironolactone (ALDACTONE) 25 MG tablet Take 1 tablet (25 mg total) by mouth daily. 90 tablet 3   No current facility-administered medications for this visit.     Musculoskeletal:   Psychiatric Specialty Exam: please note limitations in getting a full MSE in the context of phone based appointment Review of Systems dyspnea on exertion  There were no vitals taken for this visit.There is no height or weight on file to calculate BMI.  General Appearance: NA  Eye Contact:  NA  Speech:  Normal Rate  Volume:  Normal  Mood: describes ongoing depression, but some improvement compared to recent  Affect: more reactive, laughed briefly at times during session  Thought Process:  Linear and Descriptions of Associations: Intact  Orientation:  Full (Time, Place, and Person)  Thought Content: No hallucinations, no delusions   Suicidal Thoughts:  No - no suicidal plan or intention, identifies son Regis Bill as protective factor  Homicidal Thoughts:  No  Memory:  Recent and remote grossly intact  Judgement:  Present   Insight:  Present   Psychomotor Activity:  NA  Concentration:  Concentration: Good and Attention Span: Good   Recall:  Good  Fund  of Knowledge: Good  Language: Good  Akathisia:  Negative  Handed:  Right  AIMS (if indicated):   Assets:  Communication Skills Desire for Improvement Resilience  ADL's:  Intact  Cognition: WNL  Sleep:  Good   Screenings: AUDIT   Flowsheet Row Admission (Discharged) from 12/05/2013 in [facility]  Alcohol Use Disorder Identification Test Final Score (AUDIT) 0    GAD-7   Flowsheet Row Counselor from 05/23/2020 in [facility]  Total GAD-7 Score 10    PHQ2-9   Flowsheet Row Counselor from 05/23/2020 in [facility] Visit from 02/10/2020 in [facility] Visit from 11/07/2017 in [facility] Visit from 10/18/2016 in [facility] from 06/26/2016 in [facility]  PHQ-2 Total Score 6 0 2 5 4   PHQ-9 Total Score 23 0 11 8 21     Flowsheet Row Counselor from 05/23/2020 in [place] CATEGORY No Risk       Assessment and Plan:   She continues to endorse increased depression and anxiety in the context of family stressors. In brief, her husband has a history of periodically leaving and returning to his country of origin for periods of time and this last time he left without informing her or letting her know when he might come back. She knows that he has a son out of the country but suspects he may essentially have another family there, which he has denied . She states he recently called and told her he had had COVID and was tending to a sick brother which was delaying his return. States she does not believe him but knows this means he is returning soon. States that he is needed at home as a provider and to help do household maintenance and repairs and is ambivalent about whether to seek separation. Today  she appears better than on last session , with a more reactive affect . Denies SI, denies HI towards husband . Responds well to support and encouragement , review of coping skills . She did start individual psychotherapy , which I have encouraged her to continue. She was not interested in IOP or PHP referral.  Of concern she admits she has not been taking her psychiatric medications recently as has not been able to go to pharmacy recently to pick the refills up. I have stressed importance of medication compliance and the decreased likelihood of clinical improvement without adequate medication compliance . Will see in 2-3 weeks . Agrees to contact clinic sooner if any worsening prior. No medications prescribed at this time. She has med refills available at pharmacy.      Jenne Campus, MD 05/29/2020, 2:50 PM

## 2020-05-30 ENCOUNTER — Other Ambulatory Visit: Payer: Self-pay

## 2020-05-30 ENCOUNTER — Ambulatory Visit (INDEPENDENT_AMBULATORY_CARE_PROVIDER_SITE_OTHER): Payer: Medicare Other | Admitting: Licensed Clinical Social Worker

## 2020-05-30 DIAGNOSIS — F988 Other specified behavioral and emotional disorders with onset usually occurring in childhood and adolescence: Secondary | ICD-10-CM

## 2020-05-30 DIAGNOSIS — F332 Major depressive disorder, recurrent severe without psychotic features: Secondary | ICD-10-CM

## 2020-05-30 NOTE — Progress Notes (Signed)
Virtual Visit via Telephone Note   I connected with Roberto Hlavaty on 05/30/20 at 11:00am by telephone and verified that I am speaking with the correct person using two identifiers.   I discussed the limitations, risks, security and privacy concerns of performing an evaluation and management service by telephone and the availability of in person appointments. I also discussed with the patient that there may be a patient responsible charge related to this service. The patient expressed understanding and agreed to proceed.   I discussed the assessment and treatment plan with the patient. The patient was provided an opportunity to ask questions and all were answered. The patient agreed with the plan and demonstrated an understanding of the instructions.   The patient was advised to call back or seek an in-person evaluation if the symptoms worsen or if the condition fails to improve as anticipated.   I provided 1 hour of non-face-to-face time during this encounter.     Shade Flood, LCSW, LCAS ________________________________ THERAPIST PROGRESS NOTE   Session Time: 11:00am - 12:00pm   Location: Patient: Patient Home Provider: [facility]    Participation Level: Active   Behavioral Response: Alert, depressed mood   Type of Therapy:  Individual Therapy   Treatment Goals addressed: Depression, panic attack, and anxiety management; Medication Compliance; Monitoring substance use   Interventions: CBT; relapse prevention    Summary: Denym Christenberry is a 55 year old married Caucasian female on disability that presented today for therapy appointment with diagnoses of Major depressive disorder, recurrent, severe with anxious distress; and ADD.         Suicidal/Homicidal: None; without plan or intent.    Therapist Response: Clinician spoke with [place] for telephone session today and assessed for safety, sobriety, and medication compliance.  Lisia spoke in a manner that was alert, oriented x5, with no  evidence or self-report of active SI/HI or A/V H.  Khristen reported that she has not been taking medication as prescribed due to ineffectiveness of dose, and clinician encouraged her to speak with her psychiatrist about this issue for guidance.  She reported that she has been using marijuana x1 every other day and denies any negative consequences at this time, stating "Its still a good escape for me".  Clinician inquired about Pearlina's current emotional ratings, as well as any significant changes in thoughts, feelings, or behavior since completion of assessment. Azariyah reported scores of 8/10 for depression, 7/10 for anxiety, 5/10 for anger/irritability, and experienced 1 panic attack over past week.  Nolan reported that her birthday was a few days ago, and her friends wanted to take her out, but she resisted, stating "No one understands how depression affects me.  I just don't want to do anything".  Katherinne reported that one success was making the decision to quit smoking cigarettes yesterday due to ongoing breathing issues which she feels have worsened.  Effie stated "Its very important to me that I not buy anymore, but it seems like I always find an excuse to smoke".  Clinician utilized a handout with [place] focused on relapse prevention, including identification of external and internal triggers which could promote cigarette use, as well as reinforcers for maintaining abstinence from cigarettes from this point on.  Kimiyah reported that there are several recent triggers which led her to begin smoking again, such as marital stress, unexpected challenges with household/car troubles, caring for her son, and medical fears, in addition to internal triggers such as feeling depressed, anxious, overwhelmed, or abandoned.  She  reported that reinforcement to maintain abstinence includes preventing breathing from worsening, in addition to her heart problems, high blood pressure, and being in good enough shape to care for her son  with special needs and attend his eventual graduation.  Intervention was effective, as evidenced by [name] reporting greater conviction to abstain from smoking cigarettes anymore and insight into negative impact this is having upon both her mental and physical health following this discussion, stating "I want to be healthier and more independent".  Clinician encouraged Devanee to remain consistent with doctor appointments to monitor medical issues which are influencing depression, and will continue to monitor.         Plan: Follow up again in 1 week.   Diagnosis: Major depressive disorder, recurrent, severe with anxious distress; and ADD.   Shade Flood, LCSW, LCAS 05/30/20

## 2020-06-06 ENCOUNTER — Other Ambulatory Visit: Payer: Self-pay

## 2020-06-06 ENCOUNTER — Ambulatory Visit (INDEPENDENT_AMBULATORY_CARE_PROVIDER_SITE_OTHER): Payer: Medicare Other | Admitting: Licensed Clinical Social Worker

## 2020-06-06 DIAGNOSIS — F988 Other specified behavioral and emotional disorders with onset usually occurring in childhood and adolescence: Secondary | ICD-10-CM

## 2020-06-06 DIAGNOSIS — F332 Major depressive disorder, recurrent severe without psychotic features: Secondary | ICD-10-CM | POA: Diagnosis not present

## 2020-06-06 NOTE — Progress Notes (Signed)
Virtual Visit via Telephone Note   I connected with [name] on 06/06/20 at 10:10am by telephone and verified that I am speaking with the correct person using two identifiers.   I discussed the limitations, risks, security and privacy concerns of performing an evaluation and management service by telephone and the availability of in person appointments. I also discussed with the patient that there may be a patient responsible charge related to this service. The patient expressed understanding and agreed to proceed.   I discussed the assessment and treatment plan with the patient. The patient was provided an opportunity to ask questions and all were answered. The patient agreed with the plan and demonstrated an understanding of the instructions.   The patient was advised to call back or seek an in-person evaluation if the symptoms worsen or if the condition fails to improve as anticipated.  I provided 50 minutes of non-face-to-face time during this encounter.     Shade Flood, LCSW, LCAS ________________________________ THERAPIST PROGRESS NOTE   Session Time: 10:10am - 11:00am   Location: Patient: Patient Home Provider: [facility]    Participation Level: Active   Behavioral Response: Alert, depressed mood   Type of Therapy:  Individual Therapy   Treatment Goals addressed: Depression, panic attack, and anxiety management; Medication Compliance; Monitoring substance use   Interventions: CBT, grief and loss processing    Summary: [name] is a 55 year old married Caucasian female on disability that presented today for therapy appointment with diagnoses of Major depressive disorder, recurrent, severe with anxious distress; and ADD.         Suicidal/Homicidal: None; without plan or intent.    Therapist Response: Clinician spoke with [name] for telephone session today, as she had originally scheduled an in person session, but did not show up on time, so clinician called her at  10:10am and she requested to talk on the phone instead due to health issues.  Clinician assessed for safety, sobriety, and medication compliance.  [name] spoke in a manner that was alert, oriented x5, with no evidence or self-report of active SI/HI or A/V H.  [name] reported that she continues to experience difficulty with breathing that has remained consistent since last session, but has not gone to the doctor to be assessed.  Clinician encouraged [name] to monitor health closely, outreach PCP today for guidance, and call 911 if condition worsens. She reported that she also remains noncompliant with medication and continues using marijuana x1 every other day.  Clinician encouraged [name] to consider how smoking could affect breathing and begin cutting back on habits like this which could negatively affect both mental and physical health.  Clinician inquired about [name]'s current emotional ratings, as well as any significant changes in thoughts, feelings, or behavior since completion of assessment. [name] reported scores of 9/10 for depression, 9/10 for anxiety, 0/10 for anger/irritability.  [name] reported that her biggest challenge recently was learning that an 'Imam' passed away that had been assisting her son and her with food, and support for several months while her husband has been away, which has led to increased depression, and emotional sensitivity.  Clinician discussed stages of grief model with [name] today (I.e. denial, depression, bargaining, anger, and acceptance), as well as strategies to facilitate healthy processing of recent loss, including importance of staying close to support system, and considering engagement in grief and loss support groups in the community available through agencies like [facility]. Intervention was effective, as evidenced by [name] reporting greater  insight into how grief is affecting her mood and outlook at present, in addition to increased motivation to connect with  family for support, such as her father, who she is trying to improve relationship with due to increased awareness of mortality. [name] stated "I do feel really sad and emotional, like there is this sense of disbelief. I think it has made me think more about my mother passing away 3 years ago too, because I'm still going through those feelings".  Clinician will continue to monitor.     Plan: Follow up again in 1 week.   Diagnosis: Major depressive disorder, recurrent, severe with anxious distress; and ADD.   Shade Flood, [place], LCAS 06/06/20

## 2020-06-12 ENCOUNTER — Telehealth (INDEPENDENT_AMBULATORY_CARE_PROVIDER_SITE_OTHER): Payer: Medicare Other | Admitting: Psychiatry

## 2020-06-12 ENCOUNTER — Encounter (HOSPITAL_COMMUNITY): Payer: Self-pay | Admitting: Psychiatry

## 2020-06-12 ENCOUNTER — Telehealth: Payer: Self-pay | Admitting: Cardiology

## 2020-06-12 ENCOUNTER — Other Ambulatory Visit: Payer: Self-pay

## 2020-06-12 DIAGNOSIS — F331 Major depressive disorder, recurrent, moderate: Secondary | ICD-10-CM | POA: Diagnosis not present

## 2020-06-12 DIAGNOSIS — I5022 Chronic systolic (congestive) heart failure: Secondary | ICD-10-CM

## 2020-06-12 DIAGNOSIS — I10 Essential (primary) hypertension: Secondary | ICD-10-CM

## 2020-06-12 DIAGNOSIS — R0602 Shortness of breath: Secondary | ICD-10-CM

## 2020-06-12 MED ORDER — SACUBITRIL-VALSARTAN 24-26 MG PO TABS: 1.0000 | ORAL_TABLET | Freq: Two times a day (BID) | ORAL | 5 refills | Status: DC

## 2020-06-12 NOTE — Telephone Encounter (Signed)
I have suggested that she restart Lasix, potassium and spironolactone as previously prescribed with 1/2 dose Entresto and not starting the Norvasc.  I would start the Coreg at 12.5 bid.  The better option is for her to go to the ED as was suggested.  If she gets worse with her breathing she needs to call 911. She needs to get labs BMET, TSH, CBC two days after starting her meds back.  Look for any APP slots that open (cancellations this week.)  Otherwise I will see her next week.  I will have to look for a spot.

## 2020-06-12 NOTE — Telephone Encounter (Signed)
Pt c/o Shortness Of Breath: STAT if SOB developed within the last 24 hours or pt is noticeably SOB on the phone  1. Are you currently SOB (can you hear that pt is SOB on the phone)? Yes   2. How long have you been experiencing SOB?  Past few months    3. Are you SOB when sitting or when up moving around?  When up and moving around  4. Are you currently experiencing any other symptoms?  Anxiety, headache (she assumes is due to the weather)

## 2020-06-12 NOTE — Progress Notes (Signed)
BH MD/PA/NP OP Progress Note  06/12/2020 5:43 PM [name]  MRN:  [id number]  Chief Complaint: Patient returns for medication management appointment  This appointment was conducted via phone. Patient's identity confirmed via two identifiers . Limitations associated with this type of communication have been reviewed .   Location of parties  Provider- outpatient behavioral health clinic Patient- home  Duration - 35 minutes   HPI: 55 year old female.  History of MDD, Anxiety Disorder ( consider GAD).   She reports that her husband returned for overseas this last weekend.  She states that they had a long conversation where she told him she knew that he had a son in another country but that he could not just leave for periods of time without informing her , and stressed to him how his unannounced departures increase her anxiety, sense of insecurity . States he was receptive and stated he would not travel overseas again without her consent . States " he has said the same before " but reports she is less anxious and scared now that he is back. She reports she had not been taking her psychiatric ( or other ) medications except for Lasix, which she was taking inconsistently. Husband apparently took her to the pharmacy and they have picked up her medications and is planning on starting them today. She reports dyspnea on exertion and I have encouraged her to follow up with  her PCP and her Cardiologist - she agrees to go to ED if any severe dyspnea or other acute medical concerns . She denies medication side effects, although as noted was not recently taking them . Denies SI at this time.  We reviewed psychiatric medication regimen .       Visit Diagnosis: MDD, consider GAD versus Adjustment Disorder  Past Psychiatric History:   Past Medical History:  Past Medical History:  Diagnosis Date  . ADD (attention deficit disorder)   . Anxiety   . Arthritis    both knees right worse than left   . Carpal tunnel syndrome of right wrist   . Depression   . Essential hypertension, benign   . Gallstones   . History of smoking 06/22/2016  . Hyperlipidemia associated with type 2 diabetes mellitus ([place]), on Zocor 04/04/2011  . Hypertension associated with diabetes ([place]) 06/03/2008  . Migraines   . Morbid obesity ([place])   . Morbid obesity with BMI of 50.0-59.9, adult ([place]) 07/12/2006  . NICM (nonischemic cardiomyopathy) ([place]) 07/12/2006   01/16/18 ECHO:   - Procedure narrative: Transthoracic echocardiography. Image   quality was suboptimal. The study was technically difficult.   Intravenous contrast (Definity) was administered. - Left ventricle: The cavity size was moderately dilated. Wall   thickness was increased in a pattern of mild LVH. Systolic   function was moderately to severely reduced. The estimated   ejection fraction w  . Normal coronary arteries 04/17/2016  . OSA on CPAP 10/28/2014  . Spinal stenosis    back pain  . Spondylosis without myelopathy or radiculopathy, lumbar region 12/04/2017  . Type 2 diabetes mellitus with hyperglycemia [facility])     Past Surgical History:  Procedure Laterality Date  . CHOLECYSTECTOMY    . DILATATION & CURETTAGE/HYSTEROSCOPY WITH MYOSURE N/A 10/31/2017   Procedure: DILATATION & CURETTAGE/HYSTEROSCOPY WITH MYOSURE;  Surgeon: Salvadore Dom, MD;  Location: [facility];  Service: Gynecology;  Laterality: N/A;  . RIGHT/LEFT HEART CATH AND CORONARY ANGIOGRAPHY N/A 04/11/2016   Procedure: Right/Left Heart Cath and Coronary Angiography;  Surgeon: Harrell Gave  Santina Evans, MD;  Location: [facility];  Service: Cardiovascular;  Laterality: N/A;  . sonogram for blood clots     no blockages    Family Psychiatric History:   Family History:  Family History  Problem Relation Age of Onset  . Depression Mother   . Anxiety disorder Mother   . Diabetes Mother   . Hypertension Mother   . ADD / ADHD Father   . Diabetes Father   . Hypertension Father   .  Hypertension Other   . Diabetes Other   . Colitis Other   . Alcohol abuse Other     Social History:  Social History   Socioeconomic History  . Marital status: Married    Spouse name: Not on file  . Number of children: Not on file  . Years of education: Not on file  . Highest education level: Not on file  Occupational History  . Not on file  Tobacco Use  . Smoking status: Former Smoker    Packs/day: 0.50    Types: Cigarettes    Quit date: 02/25/1997    Years since quitting: 23.3  . Smokeless tobacco: Never Used  . Tobacco comment: Married, lives with spouse (when he is not traveling) and son  Vaping Use  . Vaping Use: Never used  Substance and Sexual Activity  . Alcohol use: No  . Drug use: No  . Sexual activity: Yes    Partners: Male    Birth control/protection: None  Other Topics Concern  . Not on file  Social History Narrative  . Not on file   Social Determinants of Health   Financial Resource Strain: Not on file  Food Insecurity: Not on file  Transportation Needs: Not on file  Physical Activity: Not on file  Stress: Not on file  Social Connections: Not on file    Allergies:  Allergies  Allergen Reactions  . Fluoxetine Other (See Comments)    More depressed  . Cymbalta [Duloxetine Hcl] Other (See Comments)    depressed  . Penicillins     REACTION: rash Has patient had a PCN reaction causing immediate rash, facial/tongue/throat swelling, SOB or lightheadedness with hypotension: YES Has patient had a PCN reaction causing severe rash involving mucus membranes or skin necrosis: NO Has patient had a PCN reaction that required hospitalization: YES Has patient had a PCN reaction occurring within the last 10 years: NO If all of the above answers are "NO", then may proceed with Cephalosporin use.     Metabolic Disorder Labs: Lab Results  Component Value Date   HGBA1C 7.1 (A) 02/10/2020   MPG 139.85 10/29/2017   MPG 125.5 10/19/2016   MPG 125.5 10/19/2016    No results found for: PROLACTIN Lab Results  Component Value Date   CHOL 193 02/10/2020   TRIG 147.0 02/10/2020   HDL 48.90 02/10/2020   CHOLHDL 4 02/10/2020   VLDL 29.4 02/10/2020   LDLCALC 115 (H) 02/10/2020   LDLCALC 128 (H) 07/18/2016   Lab Results  Component Value Date   TSH 3.56 02/10/2020   TSH 3.08 08/25/2018    Therapeutic Level Labs: No results found for: LITHIUM No results found for: VALPROATE No components found for:  CBMZ  Current Medications: Current Outpatient Medications  Medication Sig Dispense Refill  . amLODipine (NORVASC) 5 MG tablet Take 1 tablet by mouth daily.  (Patient not taking: Reported on 06/12/2020)    . ARIPiprazole (ABILIFY) 2 MG tablet Take 1 tablet (2 mg total) by  mouth daily. 30 tablet 0  . busPIRone (BUSPAR) 15 MG tablet Take 1 tablet (15 mg total) by mouth 3 (three) times daily. 90 tablet 0  . carvedilol (COREG) 12.5 MG tablet Take 1 tablet (12.5 mg total) by mouth 2 (two) times daily with a meal. 180 tablet 1  . clotrimazole-betamethasone (LOTRISONE) cream APPLY TO AFFECTED AREA TWICE A DAY 45 g 2  . Dapagliflozin-metFORMIN HCl ER (XIGDUO XR) 11-998 MG TB24 Take 1 tablet by mouth daily. 90 tablet 1  . furosemide (LASIX) 80 MG tablet Take 1 tablet (80 mg total) by mouth 2 (two) times daily. 60 tablet 11  . LORazepam (ATIVAN) 0.5 MG tablet Take 1 tablet (0.5 mg total) by mouth 2 (two) times daily as needed for anxiety. 10 tablet 1  . potassium chloride SA (KLOR-CON M20) 20 MEQ tablet Take 2 tablets (40 mEq total) by mouth daily. 180 tablet 3  . sacubitril-valsartan (ENTRESTO) 24-26 MG Take 1 tablet by mouth 2 (two) times daily. 60 tablet 5  . sertraline (ZOLOFT) 100 MG tablet Take 2 tablets (200 mg total) by mouth daily. 60 tablet 0  . simvastatin (ZOCOR) 20 MG tablet TAKE 1 TABLET BY MOUTH EVERY DAY 90 tablet 2   No current facility-administered medications for this visit.     Musculoskeletal:   Psychiatric Specialty Exam: please  note limitations in getting a full MSE in the context of phone based appointment Review of Systems dyspnea on exertion  There were no vitals taken for this visit.There is no height or weight on file to calculate BMI.  General Appearance: NA  Eye Contact:  NA  Speech:  Normal Rate  Volume:  Normal  Mood: reports chronic depression, acknowledges feeling less anxious as her husband has returned .   Affect: seems less constricted   Thought Process:  Linear and Descriptions of Associations: Intact  Orientation:  Full (Time, Place, and Person)  Thought Content: No hallucinations, no delusions   Suicidal Thoughts:  No - no suicidal plan or intention, identifies son Regis Bill as protective factor  Homicidal Thoughts:  No  Memory:  Recent and remote grossly intact  Judgement:  Present   Insight:  Present   Psychomotor Activity:  NA  Concentration:  Concentration: Good and Attention Span: Good  Recall:  Good  Fund of Knowledge: Good  Language: Good  Akathisia:  Negative  Handed:  Right  AIMS (if indicated):   Assets:  Communication Skills Desire for Improvement Resilience  ADL's:  Intact  Cognition: WNL  Sleep:  Good   Screenings: AUDIT   Flowsheet Row Admission (Discharged) from 12/05/2013 in [facility]  Alcohol Use Disorder Identification Test Final Score (AUDIT) 0    GAD-7   Flowsheet Row Counselor from 05/23/2020 in [place]  Total GAD-7 Score 10    PHQ2-9   Flowsheet Row Counselor from 05/23/2020 in [place] Office Visit from 02/10/2020 in [place] at [facility] Visit from 11/07/2017 in [place] Visit from 10/18/2016 in [place] from 06/26/2016 in [place]  PHQ-2 Total Score 6 0 2 5 4   PHQ-9 Total Score 23 0 11 8 21     Flowsheet Row Counselor from  05/23/2020 in [place] No Risk       Assessment and Plan:   She describes decreased anxiety in the context of her husband  returning from overseas over the weekend . She reports she was able to review how his unannounced departures affect her mood and anxiety symptoms . States he was receptive , but worries that he may leave again in the future as he reportedly has a son overseas. For now, acknowledges improved anxiety related to her husband being back. She states she  is dependent on him to help around the home and in daily activities and reports they picked up her medications from the pharmacy , and is re-starting her psychiatric medications today.  We reviewed medications and how to restart . Reviewed side effects. Restart Zoloft 50 mgr QDAY x one week, increase to 100 mgr QDAY if well tolerated , for depression, anxiety Restart Ativan 0.5 mgr BID PRN for severe anxiety Restart Abilify 2 mgr QDAY for augmentation Restart Buspar at 15 mgr QDAY initially and titrate to 15 mgr BID in one week if well tolerated   Note patient has started seeing a therapist for individual therapy. Will see again in 3-4 weeks, agrees to contact clinic sooner if any worsening prior to medication concerns  Encouraged to make follow up appointments with her PCP and Cardiologist   Does not need meds renewed  at this time    Jenne Campus, MD 06/12/2020, 5:43 PM

## 2020-06-12 NOTE — Telephone Encounter (Signed)
Minus Breeding, MD  Fidel Levy, RN I would start the Lasix, spironolactone and potassium back as previously described. I will reduce the Coreg to 12.5 bid to begin with and the Enresto to 1/2 pill bid (if we can split that) if it cannot be split I would do a new prescription for Entresto 24/26 bid. I would not suggest the Norvasc at this point.

## 2020-06-12 NOTE — Telephone Encounter (Signed)
Spoke with patient of Dr. Percival Spanish - she has pregnancy-induced cardiomyopathy since 2003 She reports shortness of breath for over a year -- said MD is aware of this She said she never "gets anywhere" when she goes to any of her doctors She reports her symptoms have worsened over the last few months She reports bad anxiety, depression, her husband has been out of the country, she is alone with her son at home She reports "extreme" depression She is frustrated that she is unable to get to the bottom of any of her ailments - back pain, anxiety/depression, breathing She is afraid to go to sleep, afraid she will not wake up  She reports she has been so depressed that she has abdonded taking her medications -- off for 2 months  >> She rationed her meds because she was running out and couldn't/wouldn't leave the house to get refills >> Her husband returned home -- got her meds back together and she will resume  -- took lasix last night but she was up to go to the bathroom multiple times but was short of breath She feels like she cannot breathe with minimal exertion  She has not been upstairs in her house in months. She has showered once in the last month due to feeling out of breath She reports fullness in her stomach/chest - she feels like this not allowing her lungs to expand   She has a pulse ox - mid-70s-92/93. Normally 85-89 per her report -- unsure if this is pulse or oxygen reading? She does not want to go to hospital - fearful of COVID, frustrated that the hospital will get you to a "safe" place and then release you to go to regular providers  She does not have daily weights - said she is over 300lbs  She has back pain issues - she was referred for water therapy but cancelled everything. She would benefit from this from a physical standpoint and possibly and emotional standpoint. She agreed to make this a goal.  Advised that patient go to ED, medcenter or urgent care for an acute evaluation  but she wants Dr. Rosezella [place] opinion She would also like guidance on how to safely resume her cardiac medications  She had a virtual visit with her psychiatrist and they have adjusted her medications, changed doses - not sure if this is updated in epic.    Total time 38mins

## 2020-06-12 NOTE — Telephone Encounter (Signed)
Patient is NOT taking spironolactone 25mg  - last refilled in 01/29/2018. She has not been on this for some time. Removed from med list  Provided patient med instructions per MD: - no amlodipine - decrease entresto to 24/26mg  twice daily - resume lasix, potassium, carvedilol at 12.5mg  BID  Med list updated.  CBC, BMET, TSH ordered - advised to have non-fasting labs done Thursday 4/21   She has a pending COVID booster, Shingrix vaccine.   She also has a Xigduo prescription - prescribed by PCP

## 2020-06-13 ENCOUNTER — Ambulatory Visit (INDEPENDENT_AMBULATORY_CARE_PROVIDER_SITE_OTHER): Payer: Medicare Other | Admitting: Licensed Clinical Social Worker

## 2020-06-13 ENCOUNTER — Other Ambulatory Visit: Payer: Self-pay

## 2020-06-13 DIAGNOSIS — F332 Major depressive disorder, recurrent severe without psychotic features: Secondary | ICD-10-CM | POA: Diagnosis not present

## 2020-06-13 DIAGNOSIS — F988 Other specified behavioral and emotional disorders with onset usually occurring in childhood and adolescence: Secondary | ICD-10-CM

## 2020-06-13 NOTE — Progress Notes (Signed)
Virtual Visit via Telephone Note   I connected with [name] on 06/13/20 at 10:00am by telephone and verified that I am speaking with the correct person using two identifiers.   I discussed the limitations, risks, security and privacy concerns of performing an evaluation and management service by telephone and the availability of in person appointments. I also discussed with the patient that there may be a patient responsible charge related to this service. The patient expressed understanding and agreed to proceed.   I discussed the assessment and treatment plan with the patient. The patient was provided an opportunity to ask questions and all were answered. The patient agreed with the plan and demonstrated an understanding of the instructions.   The patient was advised to call back or seek an in-person evaluation if the symptoms worsen or if the condition fails to improve as anticipated.   I provided 1 hour of non-face-to-face time during this encounter.     Shade Flood, LCSW, LCAS ________________________________ THERAPIST PROGRESS NOTE   Session Time: 10:00am - 11:00am  Location: Patient: Patient Home Provider: [facility]    Participation Level: Active   Behavioral Response: Alert, anxious mood   Type of Therapy:  Individual Therapy   Treatment Goals addressed: Depression, panic attack, and anxiety management; Medication Compliance; Monitoring substance use; Scheduling appointments with medical providers   Interventions: CBT, mindful breathing meditation, problem solving   Summary: [name] is a 55 year old married Caucasian female on disability that presented today for therapy appointment with diagnoses of Major depressive disorder, recurrent, severe with anxious distress; and ADD.         Suicidal/Homicidal: None; without plan or intent.    Therapist Response: Clinician spoke with [name] for telephone session today, as she remains unable to come for in-person  sessions due to health issues, or access virtual meetings.  Clinician assessed for safety, sobriety, and medication compliance.  [name] spoke in a manner that was alert, oriented x5, with no evidence or self-report of active SI/HI or A/V H.  [name] reported that she continues to experience difficulty with breathing that has remained consistent since last session, but has not gone to the doctor to be assessed.  Clinician encouraged [name] to monitor health closely, outreach PCP today for guidance, and call 911 if condition worsens. She reported that she is taking medication as prescribed now that her husband is back and assisted her with getting scripts refilled.  [name] reported that she is now smoking marijuana 2-3 times per week.  Clinician inquired about [name]'s current emotional ratings, as well as any significant changes in thoughts, feelings, or behavior since completion of assessment. [name] reported scores of 9/10 for depression, 9/10 for anxiety, 8/10 for irritability.  [name] reported that she has had 2-3 panic attacks since last session.  [name] reported that she is still having issues with breathing and this has not improved, although she did reach out to her cardiologist for assistance yesterday in [place] and she will see him next week for updates.  [name] reported that he recommended she restart medication at lower dose and take it easy until next appointment.  She stated "The anxiety of not knowing why I can't breathe or why I'm having issues with it is very stressful and upsetting".  Clinician praised Advertising account planner for outreaching her doctor and encouraged her to continue following guidance from medical providers, take medication as prescribed, and call 911 if her condition worsens.  Clinician offered to work with [name] on practicing  relaxation techniques today to reduce stress and redirect focus from aspects of the situation she cannot presently change to those she has more control over.  [name] was agreeable to  trying this, so clinician proposed practice of mindful breathing meditation, and guided her through process of getting comfortable, establishing a relaxed breathing rhythm, and then focusing upon maintaining this for 10 minutes while acknowledging thoughts and/or feelings that arose, but then letting them go to stay grounded in present and calm.  Intervention was effective, as evidenced by [name] participating in activity successfully and reporting that she felt more at peace afterward, stating "It feels like I took a vacation from my worries.  It was beneficial and I should do it a lot more.  I need to take care of myself and treat myself better".  [name] reported that while her husband is back home, she will plan to sit down with him and make a list of responsibilities in the house she needs assistance with to further reduce stress. She reported that she would call 911 should physical condition worsen prior to meeting with doctor.  Clinician encouraged [name] to prioritize these tasks beforehand in terms of importance to ensure most stress-inducing and impactful problems are addressed first for maximum benefit. Clinician will continue to monitor.      Plan: Follow up again in 1 week.   Diagnosis: Major depressive disorder, recurrent, severe with anxious distress; and ADD.   Shade Flood, [place], LCAS 06/13/20

## 2020-06-15 DIAGNOSIS — I5022 Chronic systolic (congestive) heart failure: Secondary | ICD-10-CM | POA: Diagnosis not present

## 2020-06-15 DIAGNOSIS — I10 Essential (primary) hypertension: Secondary | ICD-10-CM | POA: Diagnosis not present

## 2020-06-15 DIAGNOSIS — R0602 Shortness of breath: Secondary | ICD-10-CM | POA: Diagnosis not present

## 2020-06-15 NOTE — Telephone Encounter (Signed)
Spoke with patient and scheduled appointment for 06/19/20 @ 0820 per MD note.

## 2020-06-15 NOTE — Telephone Encounter (Signed)
Message Received: Today Minus Breeding, MD  Fidel Levy, RN; Vennie Homans I could see her on Monday at 8:20 but she has to be there at 8:20. If she is late I would not be able to see her.  If she does not use this time please let me know as I have others who can fill that spot. Thanks.

## 2020-06-16 ENCOUNTER — Encounter: Payer: Self-pay | Admitting: *Deleted

## 2020-06-16 LAB — BASIC METABOLIC PANEL
BUN/Creatinine Ratio: 20 (ref 9–23)
BUN: 16 mg/dL (ref 6–24)
CO2: 23 mmol/L (ref 20–29)
Calcium: 9.1 mg/dL (ref 8.7–10.2)
Chloride: 102 mmol/L (ref 96–106)
Creatinine, Ser: 0.8 mg/dL (ref 0.57–1.00)
Glucose: 119 mg/dL — ABNORMAL HIGH (ref 65–99)
Potassium: 4.6 mmol/L (ref 3.5–5.2)
Sodium: 144 mmol/L (ref 134–144)
eGFR: 87 mL/min/{1.73_m2} (ref >59)

## 2020-06-16 LAB — TSH: TSH: 4.26 u[IU]/mL (ref 0.450–4.500)

## 2020-06-16 LAB — CBC
Hematocrit: 43.5 % (ref 34.0–46.6)
Hemoglobin: 14.2 g/dL (ref 11.1–15.9)
MCH: 28 pg (ref 26.6–33.0)
MCHC: 32.6 g/dL (ref 31.5–35.7)
MCV: 86 fL (ref 79–97)
Platelets: 262 10*3/uL (ref 150–450)
RBC: 5.07 x10E6/uL (ref 3.77–5.28)
RDW: 14.6 % (ref 11.7–15.4)
WBC: 11.1 10*3/uL — ABNORMAL HIGH (ref 3.4–10.8)

## 2020-06-17 NOTE — Progress Notes (Signed)
Cardiology Office Note   Date:  06/19/2020   ID:  [name], DOB January 22, 1966, MRN [id number]  PCP:  [name], MD  Cardiologist:   No primary care provider on file.  Chief Complaint  Patient presents with  . Shortness of Breath      History of Present Illness: [name] is a 55 y.o. female who initially seen by me in 2016 for initial sleep clinic evaluation after initiation of CPAP therapy for obstructive sleep apnea. She had cath 04/11/16. This revealed normal coronaries, EF 25-30%, mild pulmonary HTN, and elevated LVEDP.  She was in the hospital in August 2018.  She had acute on chronic systolic and diastolic HF. She presents for follow up.   EF 12/2017 was 30 - 35%.  Her most recent echo demonstrated the EF to be 50 - 55% in Nov.    She called recently.  Unfortunately she is had severe problems with depression.  She has not been leaving her home.  She has not been taking her medications.  She has had weight gain and increased dyspnea.  We left her stone instructions recently on how to restart her medications since she been off them for a while.  She is currently taking them as listed below.  We did follow-up with some blood work recently and her thyroid electrolytes and CBC were essentially normal.  She presents for office follow-up.    She became very depressed because her husband left which she tends to do for 2 to 3 months at a time.  She has lots of social stressors.  She comes with 2 pages of complaints including back pain and sciatica.  She has a full feeling in her abdomen.  She has a slight lump in her chest wall.  From a cardiovascular standpoint she stopped all of her medicines for over 2 months.  We did restart of the other day at lower doses.  She stopped all antidepressants and is now working with a psychiatrist and is back on the meds as listed below.  She is dyspneic walking short distance on level ground.  She says her pulse ox shows saturations sometimes in the  70s and 80s.   Past Medical History:  Diagnosis Date  . ADD (attention deficit disorder)   . Anxiety   . Arthritis    both knees right worse than left  . Carpal tunnel syndrome of right wrist   . Depression   . Essential hypertension, benign   . Gallstones   . History of smoking 06/22/2016  . Hyperlipidemia associated with type 2 diabetes mellitus ([place]), on Zocor 04/04/2011  . Hypertension associated with diabetes ([place]) 06/03/2008  . Migraines   . Morbid obesity ([place])   . Morbid obesity with BMI of 50.0-59.9, adult ([place]) 07/12/2006  . NICM (nonischemic cardiomyopathy) ([place]) 07/12/2006   01/16/18 ECHO:   - Procedure narrative: Transthoracic echocardiography. Image   quality was suboptimal. The study was technically difficult.   Intravenous contrast (Definity) was administered. - Left ventricle: The cavity size was moderately dilated. Wall   thickness was increased in a pattern of mild LVH. Systolic   function was moderately to severely reduced. The estimated   ejection fraction w  . Normal coronary arteries 04/17/2016  . OSA on CPAP 10/28/2014  . Spinal stenosis    back pain  . Spondylosis without myelopathy or radiculopathy, lumbar region 12/04/2017  . Type 2 diabetes mellitus with hyperglycemia [facility])     Past Surgical History:  Procedure Laterality Date  . CHOLECYSTECTOMY    . DILATATION & CURETTAGE/HYSTEROSCOPY WITH MYOSURE N/A 10/31/2017   Procedure: DILATATION & CURETTAGE/HYSTEROSCOPY WITH MYOSURE;  Surgeon: Salvadore Dom, MD;  Location: [facility];  Service: Gynecology;  Laterality: N/A;  . RIGHT/LEFT HEART CATH AND CORONARY ANGIOGRAPHY N/A 04/11/2016   Procedure: Right/Left Heart Cath and Coronary Angiography;  Surgeon: Burnell Blanks, MD;  Location: [place] [facility];  Service: Cardiovascular;  Laterality: N/A;  . sonogram for blood clots     no blockages     Current Outpatient Medications  Medication Sig Dispense Refill  . amLODipine (NORVASC) 5 MG tablet Take 1  tablet by mouth daily.    . ARIPiprazole (ABILIFY) 2 MG tablet Take 1 tablet (2 mg total) by mouth daily. 30 tablet 0  . busPIRone (BUSPAR) 15 MG tablet Take 1 tablet (15 mg total) by mouth 3 (three) times daily. 90 tablet 0  . carvedilol (COREG) 12.5 MG tablet Take 1 tablet (12.5 mg total) by mouth 2 (two) times daily with a meal. 180 tablet 1  . clotrimazole-betamethasone (LOTRISONE) cream APPLY TO AFFECTED AREA TWICE A DAY 45 g 2  . furosemide (LASIX) 80 MG tablet Take 1 tablet (80 mg total) by mouth 2 (two) times daily. 60 tablet 11  . LORazepam (ATIVAN) 0.5 MG tablet Take 1 tablet (0.5 mg total) by mouth 2 (two) times daily as needed for anxiety. 10 tablet 1  . potassium chloride SA (KLOR-CON M20) 20 MEQ tablet Take 2 tablets (40 mEq total) by mouth daily. 180 tablet 3  . sacubitril-valsartan (ENTRESTO) 49-51 MG Take 1 tablet by mouth 2 (two) times daily. 60 tablet 2  . sertraline (ZOLOFT) 100 MG tablet Take 2 tablets (200 mg total) by mouth daily. 60 tablet 0  . simvastatin (ZOCOR) 20 MG tablet TAKE 1 TABLET BY MOUTH EVERY DAY 90 tablet 2  . Dapagliflozin-metFORMIN HCl ER (XIGDUO XR) 11-998 MG TB24 Take 1 tablet by mouth daily. 90 tablet 3   No current facility-administered medications for this visit.    Allergies:   Fluoxetine, Cymbalta [duloxetine hcl], and Penicillins    ROS:  Please see the history of present illness.   Otherwise, review of systems are positive for back pain, sciatica, hand numbness, abdominal fullness, early satiety.   All other systems are reviewed and negative.    PHYSICAL EXAM: VS:  BP (!) 148/100   Pulse (!) 113   Ht 5\' 4"  (1.626 m)   Wt (!) 341 lb 12.8 oz (155 kg)   SpO2 95%   BMI 58.67 kg/m  , BMI Body mass index is 58.67 kg/m. GENERAL:  Well appearing NECK:  No jugular venous distention, waveform within normal limits, carotid upstroke brisk and symmetric, no bruits, no thyromegaly LUNGS:  Clear to auscultation bilaterally CHEST:   Unremarkable HEART:  PMI not displaced or sustained,S1 and S2 within normal limits, no S3, no S4, no clicks, no rubs, no murmurs ABD:  Flat, positive bowel sounds normal in frequency in pitch, no bruits, no rebound, no guarding, no midline pulsatile mass, no hepatomegaly, no splenomegaly EXT:  2 plus pulses throughout, trace edema, no cyanosis no clubbing  EKG:  EKG is ordered today. Sinus rhythm, rate 80, leftward axis, premature ventricular contractions, poor anterior R wave progression   Recent Labs: 09/10/2019: BNP 54.2 02/10/2020: ALT 25 06/15/2020: BUN 16; Creatinine, Ser 0.80; Hemoglobin 14.2; Platelets 262; Potassium 4.6; Sodium 144; TSH 4.260    Lipid Panel    Component  Value Date/Time   CHOL 193 02/10/2020 1536   TRIG 147.0 02/10/2020 1536   HDL 48.90 02/10/2020 1536   CHOLHDL 4 02/10/2020 1536   VLDL 29.4 02/10/2020 1536   LDLCALC 115 (H) 02/10/2020 1536   LDLDIRECT 143.8 04/03/2011 0929      Wt Readings from Last 3 Encounters:  06/19/20 (!) 341 lb 12.8 oz (155 kg)  03/08/20 (!) 341 lb (154.7 kg)  02/10/20 (!) 351 lb (159.2 kg)      Other studies Reviewed: Additional studies/ records that were reviewed today include: Labs .   (Greater than 40 minutes reviewing all data with greater than 50% face to face with the patient).  Review of the above records demonstrates: See elsewhere   ASSESSMENT AND PLAN:  DYSPNEA -  Today is going to continue current dose of diuretic.  I am going to increase her Entresto to 49/51 twice daily.  I will recheck a basic metabolic profile and a BNP level as well as a chest x-ray.  She requests referral to pulmonary.  Await to see the results of the above first.  Unsure her dyspnea is multifactorial at this point I do not think we need another echocardiogram until I restarted titrating her meds.   CHRONIC SYSTOLIC HF -  Unfortunately her situation is complicated by her depression and significant social stressors.  This will be managed as  above.  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  SLEEP APNEA - We will get her follow-up for her CPAP.   DEPRESSION - She is now actively having treatment of this again.      DM -  I am going to resume the XigDuo 11/998 daily  Current medicines are reviewed at length with the patient today.  The patient does not have concerns regarding medicines.  The following changes have been made:  As above  Labs/ tests ordered today include:   Orders Placed This Encounter  Procedures  . DG Chest 2 View  . Basic metabolic panel  . Brain natriuretic peptide  . EKG 12-Lead     Disposition:   FU with APP in one month.      Signed, Minus Breeding, MD  06/19/2020 9:51 AM    [place]

## 2020-06-19 ENCOUNTER — Ambulatory Visit (INDEPENDENT_AMBULATORY_CARE_PROVIDER_SITE_OTHER): Payer: Medicare Other | Admitting: Cardiology

## 2020-06-19 ENCOUNTER — Other Ambulatory Visit: Payer: Self-pay

## 2020-06-19 ENCOUNTER — Encounter: Payer: Self-pay | Admitting: Cardiology

## 2020-06-19 VITALS — BP 148/100 | HR 113 | Ht 64.0 in | Wt 341.8 lb

## 2020-06-19 DIAGNOSIS — I5022 Chronic systolic (congestive) heart failure: Secondary | ICD-10-CM | POA: Diagnosis not present

## 2020-06-19 DIAGNOSIS — I428 Other cardiomyopathies: Secondary | ICD-10-CM

## 2020-06-19 DIAGNOSIS — R0602 Shortness of breath: Secondary | ICD-10-CM | POA: Diagnosis not present

## 2020-06-19 DIAGNOSIS — I10 Essential (primary) hypertension: Secondary | ICD-10-CM

## 2020-06-19 DIAGNOSIS — E1165 Type 2 diabetes mellitus with hyperglycemia: Secondary | ICD-10-CM | POA: Diagnosis not present

## 2020-06-19 MED ORDER — ENTRESTO 49-51 MG PO TABS: 1.0000 | ORAL_TABLET | Freq: Two times a day (BID) | ORAL | 2 refills | Status: DC

## 2020-06-19 MED ORDER — XIGDUO XR 10-1000 MG PO TB24: 1.0000 | ORAL_TABLET | Freq: Every day | ORAL | 3 refills | Status: DC

## 2020-06-19 NOTE — Patient Instructions (Addendum)
Medication Instructions:   INCREASE Entresto to 49-51 2 times a day  TAKE Xigduo 11-998 mg daily *If you need a refill on your cardiac medications before your next appointment, please call your pharmacy*  Lab Work: Your physician recommends that you return for lab work TODAY:   BMET  BNP If you have labs (blood work) drawn today and your tests are completely normal, you will receive your results only by: [name] MyChart Message (if you have MyChart) OR . A paper copy in the mail If you have any lab test that is abnormal or we need to change your treatment, we will call you to review the results.  Testing/Procedures: A chest x-ray takes a picture of the organs and structures inside the chest, including the heart, lungs, and blood vessels. This test can show several things, including, whether the heart is enlarges; whether fluid is building up in the lungs; and whether pacemaker / defibrillator leads are still in place.   Follow-Up: At [facility], you and your health needs are our priority.  As part of our continuing mission to provide you with exceptional heart care, we have created designated Provider Care Teams.  These Care Teams include your primary Cardiologist (physician) and Advanced Practice Providers (APPs -  Physician Assistants and Nurse Practitioners) who all work together to provide you with the care you need, when you need it.  We recommend signing up for the patient portal called "MyChart".  Sign up information is provided on this After Visit Summary.  MyChart is used to connect with patients for Virtual Visits (Telemedicine).  Patients are able to view lab/test results, encounter notes, upcoming appointments, etc.  Non-urgent messages can be sent to your provider as well.   To learn more about what you can do with MyChart, go to [email or web address].    Your next appointment:   1 month(s)  The format for your next appointment:   In Person  Provider:   APP   Other  Instructions

## 2020-06-20 ENCOUNTER — Ambulatory Visit (INDEPENDENT_AMBULATORY_CARE_PROVIDER_SITE_OTHER): Payer: Medicare Other | Admitting: Licensed Clinical Social Worker

## 2020-06-20 DIAGNOSIS — F332 Major depressive disorder, recurrent severe without psychotic features: Secondary | ICD-10-CM | POA: Diagnosis not present

## 2020-06-20 DIAGNOSIS — F988 Other specified behavioral and emotional disorders with onset usually occurring in childhood and adolescence: Secondary | ICD-10-CM | POA: Diagnosis not present

## 2020-06-20 LAB — BASIC METABOLIC PANEL
BUN/Creatinine Ratio: 25 — ABNORMAL HIGH (ref 9–23)
BUN: 22 mg/dL (ref 6–24)
CO2: 28 mmol/L (ref 20–29)
Calcium: 9.3 mg/dL (ref 8.7–10.2)
Chloride: 99 mmol/L (ref 96–106)
Creatinine, Ser: 0.87 mg/dL (ref 0.57–1.00)
Glucose: 128 mg/dL — ABNORMAL HIGH (ref 65–99)
Potassium: 4.2 mmol/L (ref 3.5–5.2)
Sodium: 141 mmol/L (ref 134–144)
eGFR: 79 mL/min/{1.73_m2} (ref >59)

## 2020-06-20 LAB — BRAIN NATRIURETIC PEPTIDE: BNP: 106.5 pg/mL — ABNORMAL HIGH (ref 0.0–100.0)

## 2020-06-20 NOTE — Progress Notes (Signed)
Virtual Visit via Telephone Note   I connected with [name] on 06/20/20 at 10:15am by telephone and verified that I am speaking with the correct person using two identifiers.   I discussed the limitations, risks, security and privacy concerns of performing an evaluation and management service by telephone and the availability of in person appointments. I also discussed with the patient that there may be a patient responsible charge related to this service. The patient expressed understanding and agreed to proceed.   I discussed the assessment and treatment plan with the patient. The patient was provided an opportunity to ask questions and all were answered. The patient agreed with the plan and demonstrated an understanding of the instructions.   The patient was advised to call back or seek an in-person evaluation if the symptoms worsen or if the condition fails to improve as anticipated.   I provided 45 minutes of non-face-to-face time during this encounter.     Shade Flood, LCSW, LCAS ________________________________ THERAPIST PROGRESS NOTE   Session Time: 10:15am - 11:00am  Location: Patient: Patient Home Provider: [facility] [place] Office    Participation Level: Active   Behavioral Response: Alert, anxious mood   Type of Therapy:  Individual Therapy   Treatment Goals addressed: Depression, panic attack, and anxiety management; Medication Compliance; Monitoring substance use; Scheduling appointments with medical providers   Interventions: CBT, mindfulness, pain management meditation    Summary: [name] is a 55 year old married Caucasian female on disability that presented today for therapy appointment with diagnoses of Major depressive disorder, recurrent, severe with anxious distress; and ADD.         Suicidal/Homicidal: None; without plan or intent.    Therapist Response: Clinician spoke with [name] for telephone session today at 10:15am due to her ongoing issues with health, and  initial technology issues with her phone that prevented speaking sooner.  Clinician assessed for safety, sobriety, and medication compliance.  [name] spoke in a manner that was alert, oriented x5, with no evidence or self-report of active SI/HI or A/V H.  [name] reported compliance with medication, but continues smoking marijuana several times per week.  Clinician inquired about [name]'s emotional ratings today, as well as any significant changes in thoughts, feelings, or behavior since last session. [name] reported scores of 7/10 for depression, 9/10 for anxiety, 0/10 for anger/irritability.  [name] reported that she has had roughly 3 panic attacks since previous session.  [name] reported that she continues having issues with breathing, but there have been some better days, and she met with cardiologist, who restarted her on medication.  [name] reported that she has tried to be more active, including walking up the stairs to the bedroom, and limiting use of her mobility scooter, but pain has been an impediment to this goal.  Clinician offered to assist [name] with this physical discomfort today by guiding her through pain relief meditation involving achieving relaxed breathing rhythm, then observing areas of pain in the body, practicing acceptance, and reciting positive affirmations while focusing the mind on achieving state of relaxation over course of 10 minutes practice.  Intervention was effective, as evidenced by [name] participating in activity and reporting that she felt a reduction in physical pain and anxiety, stating "It was very peaceful.  I felt that my mind was quieter and there weren't any racing thoughts.  It was like a mini vacation from the pain.  I realized that I wasn't in pain 100% of the time, and could relax and enjoy that  state".  Clinician provided [name] with a copy of exercise in a secure email to practice as part of developing self-care routine and will continue to monitor.      Plan: Follow up  again in 1 week.   Diagnosis: Major depressive disorder, recurrent, severe with anxious distress; and ADD.     , LCSW, LCAS 06/20/20 

## 2020-06-21 ENCOUNTER — Ambulatory Visit
Admission: RE | Admit: 2020-06-21 | Discharge: 2020-06-21 | Disposition: A | Discharge status: Home / Self Care | Payer: Medicare Other | Source: Ambulatory Visit | Attending: Cardiology | Admitting: Cardiology

## 2020-06-21 DIAGNOSIS — R0602 Shortness of breath: Secondary | ICD-10-CM

## 2020-06-27 ENCOUNTER — Ambulatory Visit (INDEPENDENT_AMBULATORY_CARE_PROVIDER_SITE_OTHER): Payer: Medicare Other | Admitting: Licensed Clinical Social Worker

## 2020-06-27 ENCOUNTER — Other Ambulatory Visit: Payer: Self-pay

## 2020-06-27 DIAGNOSIS — F332 Major depressive disorder, recurrent severe without psychotic features: Secondary | ICD-10-CM | POA: Diagnosis not present

## 2020-06-27 DIAGNOSIS — F988 Other specified behavioral and emotional disorders with onset usually occurring in childhood and adolescence: Secondary | ICD-10-CM

## 2020-06-27 NOTE — Progress Notes (Signed)
Virtual Visit via Telephone Note   I connected with [name] on 06/27/20 at 10:00am by telephone and verified that I am speaking with the correct person using two identifiers.   I discussed the limitations, risks, security and privacy concerns of performing an evaluation and management service by telephone and the availability of in person appointments. I also discussed with the patient that there may be a patient responsible charge related to this service. The patient expressed understanding and agreed to proceed.   I discussed the assessment and treatment plan with the patient. The patient was provided an opportunity to ask questions and all were answered. The patient agreed with the plan and demonstrated an understanding of the instructions.   The patient was advised to call back or seek an in-person evaluation if the symptoms worsen or if the condition fails to improve as anticipated.   I provided 1 hour of non-face-to-face time during this encounter.     Shade Flood, LCSW, LCAS ________________________________ THERAPIST PROGRESS NOTE   Session Time: 10:00am - 11:00am  Location: Patient: Patient Home Provider: [facility] [place] Office    Participation Level: Active   Behavioral Response: Alert, anxious mood   Type of Therapy:  Individual Therapy   Treatment Goals addressed: Depression, panic attack, and anxiety management; Medication Compliance; Monitoring substance use   Interventions: CBT, motivational interviewing    Summary: [name] is a 55 year old married Caucasian female on disability that presented today for therapy appointment with diagnoses of Major depressive disorder, recurrent, severe with anxious distress; and ADD.         Suicidal/Homicidal: None; without plan or intent.    Therapist Response: Clinician spoke with [name] for telephone appointment today due to her ongoing issues with health, transportation, and technological barriers.  Clinician assessed for safety,  sobriety, and medication compliance.  [name] spoke in a manner that was alert, oriented x5, with no evidence or self-report of active SI/HI or A/V H.  [name] reported that she continues taking medication as prescribed.  Clinician inquired about [name]'s emotional ratings today, as well as any significant changes in thoughts, feelings, or behavior since last session. Fatuma reported scores of 8/10 for depression, 9/10 for anxiety, 7/10 for irritability.  Thirza denied experiencing any panic attacks since last session.  [name] reported that she has been abstaining from use of marijuana over past week due to her husband's disapproval of this habit, and this has led to some irritability and resentment, making her contemplate how important the habit is to her.  Clinician utilized motivational interviewing with [name] to gauge and enhance motivation towards changing maladaptive behavior at this time in favor of healthier substitutes.  Clinician explored healthier self-care activities that [name] could engage in during free time which would be more rewarding as she continues abstinence, including hobbies, reasonably physical activities, etc.  [name] appears to be in 'contemplation' stage of change and expressed some hesitation about fully committing to sobriety at this time, but noted she was able to do gardening over the weekend which she was very proud of, stating "I haven't done anything like that in a few years and I hurt afterward, but it looks beautiful and was a thrill".  Interventions were effective, as evidenced by [name] reporting that although depression limits her motivation level most days, she will continue to put effort towards meaningful activities with her free time during upcoming days, stating "I know I have to keep trying and I'll eventually find some relief".  Clinician encouraged  Lovella to continue abstinence from harmful substances, take medications as prescribed, will continue to monitor.      Plan: Follow  up again in 1 week.   Diagnosis: Major depressive disorder, recurrent, severe with anxious distress; and ADD.   Shade Flood, LCSW, LCAS 06/27/20

## 2020-06-30 ENCOUNTER — Other Ambulatory Visit: Payer: Self-pay

## 2020-06-30 ENCOUNTER — Ambulatory Visit (INDEPENDENT_AMBULATORY_CARE_PROVIDER_SITE_OTHER): Payer: Medicare Other | Admitting: Internal Medicine

## 2020-06-30 ENCOUNTER — Encounter: Payer: Self-pay | Admitting: Internal Medicine

## 2020-06-30 ENCOUNTER — Ambulatory Visit: Payer: Medicare Other

## 2020-06-30 VITALS — BP 132/84 | HR 89 | Temp 99.0°F | Ht 64.0 in | Wt 333.4 lb

## 2020-06-30 DIAGNOSIS — G8929 Other chronic pain: Secondary | ICD-10-CM

## 2020-06-30 DIAGNOSIS — E1165 Type 2 diabetes mellitus with hyperglycemia: Secondary | ICD-10-CM

## 2020-06-30 DIAGNOSIS — M542 Cervicalgia: Secondary | ICD-10-CM | POA: Diagnosis not present

## 2020-06-30 DIAGNOSIS — Z6841 Body Mass Index (BMI) 40.0 and over, adult: Secondary | ICD-10-CM

## 2020-06-30 DIAGNOSIS — M545 Low back pain, unspecified: Secondary | ICD-10-CM | POA: Diagnosis not present

## 2020-06-30 MED ORDER — METHYLPREDNISOLONE ACETATE 40 MG/ML IJ SUSP
40.0000 mg | Freq: Once | INTRAMUSCULAR | Status: AC
  Administered 2020-06-30: 40 mg via INTRAMUSCULAR

## 2020-06-30 MED ORDER — KETOROLAC TROMETHAMINE 30 MG/ML IJ SOLN
30.0000 mg | Freq: Once | INTRAMUSCULAR | Status: AC
  Administered 2020-06-30: 30 mg via INTRAMUSCULAR

## 2020-06-30 NOTE — Assessment & Plan Note (Signed)
Complicated by HTN, hyperlipidemia. Last HgA1c 7.1. She is advised to keep follow up with PCP every 6 months at least and is due in June for this follow up. She is taking xigduo currently which is indicated given her systolic heart failure. She is on simvastatin and ACE-I.

## 2020-06-30 NOTE — Assessment & Plan Note (Signed)
Given depo-medrol 40 mg IM and toradol 30 mg IM. Does have concurrent diabetes but control is acceptable for injection with last HgA1c 7.1. We did discuss that her weight is likely a factor in the severity and will worsen her spinal arthritis. Referral to neurosurgery to help assess a longer term management strategy and we did discuss if this helps it would likely be short term only.

## 2020-06-30 NOTE — Assessment & Plan Note (Signed)
Given depo-medrol 40 mg IM and toradol 30 mg IM. Could be related to cervical stenosis. She has moderate to severe lumbar degeneration as of 2019. We did discuss how weight does cause accelerated arthritis progression in the spine and other joints which may be contributing to her pain.

## 2020-06-30 NOTE — Patient Instructions (Signed)
We have given you the shots today which may help the back pain.  We will get you in with a spine specialist to talk about the back longer term. Losing weight will help to prevent progression

## 2020-06-30 NOTE — Progress Notes (Signed)
   Subjective:   Patient ID: [name], female    DOB: Oct 04, 1965, 55 y.o.   MRN: [id number]  HPI The patient is a 55 YO female coming in for concerns about neck pain (pain started about a week ago in the neck and shoulder, right sided, going into her neck, started all of the sudden, denies exertion or trigger known, since that time has been worsening, she has had injections for similar before and wanted some relief for this, denies numbness or weakness in her arms) and low back pain (going on for years, has not seen spine specialist recently, has gotten several what sounds like IM steroid shots, she cannot recall exactly, and maybe a prior spinal injection, she is worried and wants to get this fixed so that she can move around without pain, was supposed to start PT water for this but got overwhelmed by her depression, this is under a little better control now and she wants to work on her health) and diabetes (she has questions about how progressed this is, thinks her numbers were just checked by cardiology, taking xigduo, complicated).   Review of Systems  Constitutional: Positive for activity change. Negative for appetite change, chills, fatigue, fever and unexpected weight change.  Respiratory: Negative.   Cardiovascular: Negative.   Gastrointestinal: Negative.   Musculoskeletal: Positive for arthralgias, back pain, myalgias, neck pain and neck stiffness. Negative for gait problem and joint swelling.  Skin: Negative.   Neurological: Negative.     Objective:  Physical Exam Constitutional:      Appearance: She is well-developed. She is obese.  HENT:     Head: Normocephalic and atraumatic.  Cardiovascular:     Rate and Rhythm: Normal rate and regular rhythm.  Pulmonary:     Effort: Pulmonary effort is normal. No respiratory distress.     Breath sounds: Normal breath sounds. No wheezing or rales.  Abdominal:     General: Bowel sounds are normal. There is no distension.     Palpations:  Abdomen is soft.     Tenderness: There is no abdominal tenderness. There is no rebound.  Musculoskeletal:        General: Tenderness present.     Cervical back: Normal range of motion.  Skin:    General: Skin is warm and dry.  Neurological:     Mental Status: She is alert and oriented to person, place, and time.     Coordination: Coordination normal.     Vitals:   06/30/20 0757  BP: 132/84  Pulse: 89  Temp: 99 F (37.2 C)  TempSrc: Oral  SpO2: 94%  Weight: (!) 333 lb 6.4 oz (151.2 kg)  Height: 5\' 4"  (1.626 m)    This visit occurred during the SARS-CoV-2 public health emergency.  Safety protocols were in place, including screening questions prior to the visit, additional usage of staff PPE, and extensive cleaning of exam room while observing appropriate contact time as indicated for disinfecting solutions.   Assessment & Plan:  Depo-medrol 40 mg IM and Toradol 30 mg IM given at visit

## 2020-06-30 NOTE — Assessment & Plan Note (Signed)
She is aware that this contributes to her back pain and encouraged her to make visit with her PCP to work on plan for weight loss and possible medication addition/changes.

## 2020-07-05 ENCOUNTER — Other Ambulatory Visit: Payer: Self-pay

## 2020-07-05 ENCOUNTER — Ambulatory Visit (INDEPENDENT_AMBULATORY_CARE_PROVIDER_SITE_OTHER): Payer: Medicare Other | Admitting: Licensed Clinical Social Worker

## 2020-07-05 DIAGNOSIS — F332 Major depressive disorder, recurrent severe without psychotic features: Secondary | ICD-10-CM

## 2020-07-05 DIAGNOSIS — F988 Other specified behavioral and emotional disorders with onset usually occurring in childhood and adolescence: Secondary | ICD-10-CM | POA: Diagnosis not present

## 2020-07-05 NOTE — Progress Notes (Signed)
Virtual Visit via Telephone Note   I connected with [name] on 07/05/20 at 10:15am by telephone and verified that I am speaking with the correct person using two identifiers.   I discussed the limitations, risks, security and privacy concerns of performing an evaluation and management service by telephone and the availability of in person appointments. I also discussed with the patient that there may be a patient responsible charge related to this service. The patient expressed understanding and agreed to proceed.   I discussed the assessment and treatment plan with the patient. The patient was provided an opportunity to ask questions and all were answered. The patient agreed with the plan and demonstrated an understanding of the instructions.   The patient was advised to call back or seek an in-person evaluation if the symptoms worsen or if the condition fails to improve as anticipated.   I provided 45 minutes of non-face-to-face time during this encounter.     Samantha Flood, LCSW, LCAS ________________________________ THERAPIST PROGRESS NOTE   Session Time: 10:15am - 11:00am  Location: Patient: Patient Home Provider: Clinical Home Office    Participation Level: Active   Behavioral Response: Alert, anxious mood   Type of Therapy:  Individual Therapy   Treatment Goals addressed: Depression, panic attack, and anxiety management; Medication Compliance; Monitoring substance use   Interventions: CBT, 5-4-3-2-1 grounding    Summary: [name] is a 55 year old married Caucasian female on disability that presented today for therapy appointment with diagnoses of Major depressive disorder, recurrent, severe with anxious distress; and ADD.         Suicidal/Homicidal: None; without plan or intent.    Therapist Response: Clinician spoke with [name] for telephone session today due to her ongoing issues with health, transportation, and technological barriers.  Clinician was unable to reach  [name] until 3rd outreach attempt at 10:15am, and she reported that she has been missing calls, and apologized.  Clinician assessed for safety, sobriety, and medication compliance.  [name] spoke in a manner that was alert, oriented x5, with no evidence or self-report of active SI/HI or A/V H.  [name] reported ongoing compliance with medication, and has limited marijuana use to x2 over past week.  Clinician inquired about [name]'s current emotional ratings, as well as any significant changes in thoughts, feelings, or behavior since previous session. [name] reported scores of 8/10 for depression, 10/10 for anxiety, 5/10 for irritability.  [name] reported that one success over past week was talking to her husband about them entering marriage counseling together, stating "We haven't followed through with it, but he seemed open to it".  [name] reported that one challenge was experiencing a panic attack last week, stating "It was really bad, I had this feeling of dread".  Clinician inquired about [name]'s insight into triggers which might have influenced this episode, and how she attempted to cope.  [name] reported that she woke up feeling 'jittery' that day and her son left to go spend time with a friend, so this may have influenced her anxiety.  Clinician offered to teach [name] 5-4-3-2-1 grounding technique today to assist in temporary distraction from distressing thoughts/feelings in the future that could influence panic attacks.  [name] was agreeable to this, so clinician guided her through process of using her environment for distraction by identifying 5 things she could see, 4 things she could touch, 3 things she could hear, 2 things she could smell, and 1 thing she could taste. Intervention was effective, as evidenced by [name] participating in  activity successfully and reporting that she feels it could be helpful if she practices it often, stating "I think if it comes naturally it could be really good to take my mind off  things".  Clinician will continue to monitor.      Plan: Follow up again in 1 week.   Diagnosis: Major depressive disorder, recurrent, severe with anxious distress; and ADD.   Samantha Flood, LCSW, LCAS 07/05/20

## 2020-07-07 ENCOUNTER — Ambulatory Visit (INDEPENDENT_AMBULATORY_CARE_PROVIDER_SITE_OTHER): Payer: Medicare Other | Admitting: Physician Assistant

## 2020-07-07 ENCOUNTER — Other Ambulatory Visit: Payer: Self-pay

## 2020-07-07 ENCOUNTER — Encounter: Payer: Self-pay | Admitting: Physician Assistant

## 2020-07-07 VITALS — BP 115/77 | HR 78 | Ht 64.0 in | Wt 333.6 lb

## 2020-07-07 DIAGNOSIS — E785 Hyperlipidemia, unspecified: Secondary | ICD-10-CM

## 2020-07-07 DIAGNOSIS — I428 Other cardiomyopathies: Secondary | ICD-10-CM | POA: Diagnosis not present

## 2020-07-07 DIAGNOSIS — R0600 Dyspnea, unspecified: Secondary | ICD-10-CM

## 2020-07-07 DIAGNOSIS — E119 Type 2 diabetes mellitus without complications: Secondary | ICD-10-CM

## 2020-07-07 DIAGNOSIS — I10 Essential (primary) hypertension: Secondary | ICD-10-CM

## 2020-07-07 NOTE — Patient Instructions (Signed)
Medication Instructions:  Your physician recommends that you continue on your current medications as directed. Please refer to the Current Medication list given to you today.  *If you need a refill on your cardiac medications before your next appointment, please call your pharmacy*  Lab Work: NONE ordered at this time of appointment   If you have labs (blood work) drawn today and your tests are completely normal, you will receive your results only by: [name] MyChart Message (if you have MyChart) OR . A paper copy in the mail If you have any lab test that is abnormal or we need to change your treatment, we will call you to review the results.  Testing/Procedures: NONE ordered at this time of appointment   Follow-Up: At [facility], you and your health needs are our priority.  As part of our continuing mission to provide you with exceptional heart care, we have created designated Provider Care Teams.  These Care Teams include your primary Cardiologist (physician) and Advanced Practice Providers (APPs -  Physician Assistants and Nurse Practitioners) who all work together to provide you with the care you need, when you need it.  We recommend signing up for the patient portal called "MyChart".  Sign up information is provided on this After Visit Summary.  MyChart is used to connect with patients for Virtual Visits (Telemedicine).  Patients are able to view lab/test results, encounter notes, upcoming appointments, etc.  Non-urgent messages can be sent to your provider as well.   To learn more about what you can do with MyChart, go to [email or web address].    Your next appointment:   3 month(s)  The format for your next appointment:   In Person  Provider:   Minus Breeding, MD  Other Instructions

## 2020-07-07 NOTE — Progress Notes (Signed)
Cardiology Office Note:    Date:  07/09/2020   ID:  [name], DOB October 26, 1965, MRN [id number]  PCP:  [name], MD   [place] Providers Cardiologist:  Minus Breeding, MD     Referring MD: [name], MD   Chief Complaint  Patient presents with  . Follow-up    Seen for Dr Percival Spanish    History of Present Illness:    [name] is a 55 y.o. female with a hx of ADD, depression, hypertension, hyperlipidemia, DM II, morbid obesity, obstructive sleep apnea on CPAP therapy and a history of nonischemic cardiomyopathy.  Cardiac catheterization in February 2018 revealed: Normal coronary arteries, EF 25 to 30%, mild pulmonary hypertension, elevated LVEDP.  She had acute on chronic systolic and diastolic heart failure in August 2018.  Repeat echocardiogram in November 2019 showed EF 30 to 35%.  Most recent echocardiogram in November 2021 showed EF has improved to 50 to 55%.  Patient was last seen by Dr. Percival Spanish on 06/19/2020, it seems she has been dealing with depression and stopped all her medication for over 2 months, the medication has been restarted at lower dose.  She was still complaining of dyspnea, Entresto was increased to 49-51 mg twice a day.  Since Entresto was increased, her breathing has improved.  Her chest x-ray showed no acute finding.  I will hold off on pulmonology referral.  I suspect her shortness of breath is due to combination of morbid obesity and history of heart failure.  She appears to be euvolemic on physical exam.  She always has slightly worsening left lower extremity edema compared to the right side.  Previous venous Doppler in 2019 was negative for DVT.  She did ask about the COVID-19 booster, I would highly recommend it.  She complained of memory issues, none of her cardiac medication should cause her memory issue.    Past Medical History:  Diagnosis Date  . ADD (attention deficit disorder)   . Anxiety   . Arthritis    both knees right worse than  left  . Carpal tunnel syndrome of right wrist   . Depression   . Essential hypertension, benign   . Gallstones   . History of smoking 06/22/2016  . Hyperlipidemia associated with type 2 diabetes mellitus ([place]), on Zocor 04/04/2011  . Hypertension associated with diabetes ([place]) 06/03/2008  . Migraines   . Morbid obesity ([place])   . Morbid obesity with BMI of 50.0-59.9, adult ([place]) 07/12/2006  . NICM (nonischemic cardiomyopathy) ([place]) 07/12/2006   01/16/18 ECHO:   - Procedure narrative: Transthoracic echocardiography. Image   quality was suboptimal. The study was technically difficult.   Intravenous contrast (Definity) was administered. - Left ventricle: The cavity size was moderately dilated. Wall   thickness was increased in a pattern of mild LVH. Systolic   function was moderately to severely reduced. The estimated   ejection fraction w  . Normal coronary arteries 04/17/2016  . OSA on CPAP 10/28/2014  . Spinal stenosis    back pain  . Spondylosis without myelopathy or radiculopathy, lumbar region 12/04/2017  . Type 2 diabetes mellitus with hyperglycemia [facility])     Past Surgical History:  Procedure Laterality Date  . CHOLECYSTECTOMY    . DILATATION & CURETTAGE/HYSTEROSCOPY WITH MYOSURE N/A 10/31/2017   Procedure: DILATATION & CURETTAGE/HYSTEROSCOPY WITH MYOSURE;  Surgeon: Salvadore Dom, MD;  Location: [place] [facility];  Service: Gynecology;  Laterality: N/A;  . RIGHT/LEFT HEART CATH AND CORONARY ANGIOGRAPHY N/A 04/11/2016  Procedure: Right/Left Heart Cath and Coronary Angiography;  Surgeon: Burnell Blanks, MD;  Location: [place] [facility];  Service: Cardiovascular;  Laterality: N/A;  . sonogram for blood clots     no blockages    Current Medications: Current Meds  Medication Sig  . amLODipine (NORVASC) 5 MG tablet Take 1 tablet by mouth daily.  . ARIPiprazole (ABILIFY) 2 MG tablet Take 1 tablet (2 mg total) by mouth daily.  . busPIRone (BUSPAR) 15 MG tablet Take 1 tablet (15 mg total) by  mouth 3 (three) times daily.  . carvedilol (COREG) 12.5 MG tablet Take 1 tablet (12.5 mg total) by mouth 2 (two) times daily with a meal.  . clotrimazole-betamethasone (LOTRISONE) cream APPLY TO AFFECTED AREA TWICE A DAY  . Dapagliflozin-metFORMIN HCl ER (XIGDUO XR) 11-998 MG TB24 Take 1 tablet by mouth daily.  . furosemide (LASIX) 80 MG tablet Take 1 tablet (80 mg total) by mouth 2 (two) times daily.  [name] LORazepam (ATIVAN) 0.5 MG tablet Take 1 tablet (0.5 mg total) by mouth 2 (two) times daily as needed for anxiety.  . potassium chloride SA (KLOR-CON M20) 20 MEQ tablet Take 2 tablets (40 mEq total) by mouth daily.  . sacubitril-valsartan (ENTRESTO) 49-51 MG Take 1 tablet by mouth 2 (two) times daily.  . sertraline (ZOLOFT) 100 MG tablet Take 2 tablets (200 mg total) by mouth daily.  . simvastatin (ZOCOR) 20 MG tablet TAKE 1 TABLET BY MOUTH EVERY DAY     Allergies:   Fluoxetine, Cymbalta [duloxetine hcl], and Penicillins   Social History   Socioeconomic History  . Marital status: Married    Spouse name: Not on file  . Number of children: Not on file  . Years of education: Not on file  . Highest education level: Not on file  Occupational History  . Not on file  Tobacco Use  . Smoking status: Former Smoker    Packs/day: 0.50    Types: Cigarettes    Quit date: 02/25/1997    Years since quitting: 23.3  . Smokeless tobacco: Never Used  . Tobacco comment: Married, lives with spouse (when he is not traveling) and son  Vaping Use  . Vaping Use: Never used  Substance and Sexual Activity  . Alcohol use: No  . Drug use: No  . Sexual activity: Yes    Partners: Male    Birth control/protection: None  Other Topics Concern  . Not on file  Social History Narrative  . Not on file   Social Determinants of Health   Financial Resource Strain: Not on file  Food Insecurity: Not on file  Transportation Needs: Not on file  Physical Activity: Not on file  Stress: Not on file  Social  Connections: Not on file     Family History: The patient's family history includes ADD / ADHD in her father; Alcohol abuse in an other family member; Anxiety disorder in her mother; Colitis in an other family member; Depression in her mother; Diabetes in her father, mother, and another family member; Hypertension in her father, mother, and another family member.  ROS:   Please see the history of present illness.     All other systems reviewed and are negative.  EKGs/Labs/Other Studies Reviewed:    The following studies were reviewed today:  Echo 12/30/2018 1. Left ventricular ejection fraction, by visual estimation, is 50 to  55%. The left ventricle has low normal function. There is no left  ventricular hypertrophy.  2. Mildly dilated left  ventricular internal cavity size.  3. Inferior basal wall hypokinesis   Abnormal GLS -12.2.  4. Global right ventricle has normal systolic function.The right  ventricular size is normal. No increase in right ventricular wall  thickness.  5. Left atrial size was moderately dilated.  6. Right atrial size was normal.  7. The mitral valve is normal in structure. No evidence of mitral valve  regurgitation. No evidence of mitral stenosis.  8. The tricuspid valve is normal in structure. Tricuspid valve  regurgitation is trivial.  9. The aortic valve is normal in structure. Aortic valve regurgitation is  not visualized. Mild aortic valve sclerosis without stenosis.  10. The pulmonic valve was grossly normal. Pulmonic valve regurgitation is  trivial.  11. Normal pulmonary artery systolic pressure.  12. The inferior vena cava is normal in size with greater than 50%  respiratory variability, suggesting right atrial pressure of 3 mmHg.    EKG:  EKG is not ordered today.    Recent Labs: 02/10/2020: ALT 25 06/15/2020: Hemoglobin 14.2; Platelets 262; TSH 4.260 06/19/2020: BNP 106.5; BUN 22; Creatinine, Ser 0.87; Potassium 4.2; Sodium 141   Recent Lipid Panel    Component Value Date/Time   CHOL 193 02/10/2020 1536   TRIG 147.0 02/10/2020 1536   HDL 48.90 02/10/2020 1536   CHOLHDL 4 02/10/2020 1536   VLDL 29.4 02/10/2020 1536   LDLCALC 115 (H) 02/10/2020 1536   LDLDIRECT 143.8 04/03/2011 0929     Risk Assessment/Calculations:       Physical Exam:    VS:  BP 115/77   Pulse 78   Ht 5\' 4"  (1.626 m)   Wt (!) 333 lb 9.6 oz (151.3 kg)   SpO2 97%   BMI 57.26 kg/m     Wt Readings from Last 3 Encounters:  07/07/20 (!) 333 lb 9.6 oz (151.3 kg)  06/30/20 (!) 333 lb 6.4 oz (151.2 kg)  06/19/20 (!) 341 lb 12.8 oz (155 kg)     GEN:  Well nourished, well developed in no acute distress HEENT: Normal NECK: No JVD; No carotid bruits LYMPHATICS: No lymphadenopathy CARDIAC: RRR, no murmurs, rubs, gallops RESPIRATORY:  Clear to auscultation without rales, wheezing or rhonchi  ABDOMEN: Soft, non-tender, non-distended MUSCULOSKELETAL:  No edema; No deformity  SKIN: Warm and dry NEUROLOGIC:  Alert and oriented x 3 PSYCHIATRIC:  Normal affect   ASSESSMENT:    1. Dyspnea, unspecified type   2. NICM (nonischemic cardiomyopathy) ([place])   3. Essential hypertension   4. Hyperlipidemia LDL goal <70   5. Controlled type 2 diabetes mellitus without complication, without long-term current use of insulin (HCC)    PLAN:    In order of problems listed above:  1. Dyspnea: Her symptom is likely due to combination of obesity and history of heart failure.  Ever since [place] was increased, dyspnea has improved.  Chest x-ray does not show acute pulmonary issue, I will hold off on pulmonology referral  2. Nonischemic cardiomyopathy: On carvedilol and Entresto.  EF normalized on recent echocardiogram.  3. Hypertension: Blood pressure stable  4. Hyperlipidemia: On Zocor  5. DM2: Managed by primary care provider        Medication Adjustments/Labs and Tests Ordered: Current medicines are reviewed at length with the patient  today.  Concerns regarding medicines are outlined above.  No orders of the defined types were placed in this encounter.  No orders of the defined types were placed in this encounter.   Patient Instructions  Medication Instructions:  Your  physician recommends that you continue on your current medications as directed. Please refer to the Current Medication list given to you today.  *If you need a refill on your cardiac medications before your next appointment, please call your pharmacy*  Lab Work: NONE ordered at this time of appointment   If you have labs (blood work) drawn today and your tests are completely normal, you will receive your results only by: [name] MyChart Message (if you have MyChart) OR . A paper copy in the mail If you have any lab test that is abnormal or we need to change your treatment, we will call you to review the results.  Testing/Procedures: NONE ordered at this time of appointment   Follow-Up: At [facility], you and your health needs are our priority.  As part of our continuing mission to provide you with exceptional heart care, we have created designated Provider Care Teams.  These Care Teams include your primary Cardiologist (physician) and Advanced Practice Providers (APPs -  Physician Assistants and Nurse Practitioners) who all work together to provide you with the care you need, when you need it.  We recommend signing up for the patient portal called "MyChart".  Sign up information is provided on this After Visit Summary.  MyChart is used to connect with patients for Virtual Visits (Telemedicine).  Patients are able to view lab/test results, encounter notes, upcoming appointments, etc.  Non-urgent messages can be sent to your provider as well.   To learn more about what you can do with MyChart, go to [email or web address].    Your next appointment:   3 month(s)  The format for your next appointment:   In Person  Provider:   Minus Breeding,  MD  Other Instructions      Signed, Almyra Deforest, [place]  07/09/2020 10:20 PM    [place]

## 2020-07-08 ENCOUNTER — Other Ambulatory Visit (HOSPITAL_COMMUNITY): Payer: Self-pay | Admitting: Psychiatry

## 2020-07-09 ENCOUNTER — Encounter: Payer: Self-pay | Admitting: Physician Assistant

## 2020-07-10 ENCOUNTER — Ambulatory Visit (INDEPENDENT_AMBULATORY_CARE_PROVIDER_SITE_OTHER): Payer: Medicare Other | Admitting: Licensed Clinical Social Worker

## 2020-07-10 ENCOUNTER — Other Ambulatory Visit: Payer: Self-pay

## 2020-07-10 DIAGNOSIS — F988 Other specified behavioral and emotional disorders with onset usually occurring in childhood and adolescence: Secondary | ICD-10-CM | POA: Diagnosis not present

## 2020-07-10 DIAGNOSIS — F332 Major depressive disorder, recurrent severe without psychotic features: Secondary | ICD-10-CM | POA: Diagnosis not present

## 2020-07-10 NOTE — Progress Notes (Signed)
Virtual Visit via Telephone Note   I connected with [name] on 07/10/20 at 10:00am by telephone and verified that I am speaking with the correct person using two identifiers.   I discussed the limitations, risks, security and privacy concerns of performing an evaluation and management service by telephone and the availability of in person appointments. I also discussed with the patient that there may be a patient responsible charge related to this service. The patient expressed understanding and agreed to proceed.   I discussed the assessment and treatment plan with the patient. The patient was provided an opportunity to ask questions and all were answered. The patient agreed with the plan and demonstrated an understanding of the instructions.   The patient was advised to call back or seek an in-person evaluation if the symptoms worsen or if the condition fails to improve as anticipated.   I provided 1 hour of non-face-to-face time during this encounter.     Shade Flood, LCSW, LCAS ________________________________ THERAPIST PROGRESS NOTE   Session Time: 10:00am - 11:00am  Location: Patient: Patient Home Provider: [facility] [place] Office    Participation Level: Active   Behavioral Response: Alert, anxious mood   Type of Therapy:  Individual Therapy   Treatment Goals addressed: Depression, panic attack, and anxiety management; Medication Compliance; Monitoring substance use   Interventions: CBT, challenging anxious thoughts    Summary: [name] is a 55 year old married Caucasian female on disability that presented today for therapy appointment with diagnoses of Major depressive disorder, recurrent, severe with anxious distress; and ADD.         Suicidal/Homicidal: None; without plan or intent.    Therapist Response: Clinician spoke with [name] for telephone session today due to her inability to travel or access video meetings.  Clinician assessed for safety, sobriety, and medication  compliance.  [name] spoke in a manner that was alert, oriented x5, with no evidence or self-report of active SI/HI or A/V H.  [name] reported ongoing compliance with medication, and denied smoking marijuana since last session, stating "It was on my mind, but I don't want to do it around my husband and upset him".  Clinician encouraged [name] to continue with abstinence efforts and praised her for this clean time.  Clinician inquired about [name]'s current emotional ratings, as well as any significant changes in thoughts, feelings, or behavior since previous session. [name] reported scores of 5/10 for depression, 9/10 for anxiety, 0/10 for anger/irritability.  [name] denied any reoccurrence of panic attacks.  [name] reported that one success over past week was scheduling her COVID booster shot, although this has led to some anticipatory anxiety.  Clinician utilized 'worry exploration' CBT handout with [name] today in session to assist her with reducing this anxiety.  This handout featured a series of questions to guide discussion, including identifying the specific worry, comparing available evidence that supports and/or disputes the likelihood of negative outcome(s) occurring to reinforce rational thinking, and coping strategies for handling this challenge should worst case scenario actually come to fruition.  [name] participated in activity and reported that she worries there will be side effects that occur from booster due to what she has read online and heard from peers.  She acknowledged that she was also initially worried about the first two vaccinations she received, but aside from physical pain at the shot site and fatigue, there were no major side effects that fit what she has read or heard, so she feels like the booster will likely have minimal  effects as well.  Intervention was effective, as evidenced by [name] reporting that her anxiety decreased following this conversation on her worry, and she felt more confident  about going through with her booster shot, stating "You have to weigh this stuff out.  I'm gonna try to keep a positive mindset, go ahead and do it because I want to be protected and avoid getting COVID and ending up in the hospital in worse shape".  Clinician encouraged [name] to discuss decision about receiving booster shot with her PCP for medical guidance based upon her preexisting conditions.  Clinician will continue to monitor.     Plan: Follow up again in 1 week.   Diagnosis: Major depressive disorder, recurrent, severe with anxious distress; and ADD.   Shade Flood, LCSW, LCAS 07/10/20

## 2020-07-11 ENCOUNTER — Ambulatory Visit (INDEPENDENT_AMBULATORY_CARE_PROVIDER_SITE_OTHER): Payer: Medicare Other | Admitting: Psychiatry

## 2020-07-11 ENCOUNTER — Encounter (HOSPITAL_COMMUNITY): Payer: Self-pay | Admitting: Psychiatry

## 2020-07-11 ENCOUNTER — Ambulatory Visit: Payer: Medicare Other

## 2020-07-11 ENCOUNTER — Other Ambulatory Visit: Payer: Self-pay

## 2020-07-11 DIAGNOSIS — F3341 Major depressive disorder, recurrent, in partial remission: Secondary | ICD-10-CM | POA: Diagnosis not present

## 2020-07-11 DIAGNOSIS — M542 Cervicalgia: Secondary | ICD-10-CM | POA: Diagnosis not present

## 2020-07-11 DIAGNOSIS — M5416 Radiculopathy, lumbar region: Secondary | ICD-10-CM | POA: Diagnosis not present

## 2020-07-11 MED ORDER — LORAZEPAM 0.5 MG PO TABS: 0.5000 mg | ORAL_TABLET | Freq: Two times a day (BID) | ORAL | 1 refills | Status: DC | PRN

## 2020-07-11 MED ORDER — SERTRALINE HCL 100 MG PO TABS: 150.0000 mg | ORAL_TABLET | Freq: Every day | ORAL | 1 refills | Status: DC

## 2020-07-11 NOTE — Progress Notes (Signed)
BH MD/PA/NP OP Progress Note  07/11/2020 2:22 PM [name]  MRN:  [id number]  Chief Complaint: Patient returns for medication management appointment  This appointment was conducted via phone. Patient's identity confirmed via two identifiers . Limitations associated with this type of communication have been reviewed .   Location of parties  Provider- outpatient behavioral health clinic Patient- home  Duration - 30 minutes   HPI: 55 year old female.  History of MDD, Anxiety Disorder ( consider GAD).   Patient acknowledges she has been feeling " a little better" which she attributes in part to her husband being back home. She states " when he is here we really are a good couple, like best friends ", but reports she continues to have anxiety that he may leave again without her knowledge, as has happened in the past. She reports he does seem more insightful with regards to how his trips out of the country affect her and increase anxiety/depression and has reassured her he has no further trips planned. She is aware he has a son in his country of origin .  Today presents better, noted to laugh at times appropriately during session, and less somatic . Less focused on negative issues and reported looking forward to the Summer . Denies SI. Continues to endorse mild anhedonia and low energy level .  She reports interest in genetic testing as a tool to identify potential antidepressant therapies but is unsure of coverage/cost/affordability , and I encouraged her to investigate further and would order if she is interested in proceeding .  She also inquired about stimulant medication to help her improve focus /concentration - we reviewed rationale to minimize or avoid stimulant medications in the context of their side effect, particularly cardiovascular, profile .   She also seems to be benefiting from individual psychotherapy.       Visit Diagnosis: MDD, consider GAD versus Adjustment Disorder   Past Psychiatric History:   Past Medical History:  Past Medical History:  Diagnosis Date  . ADD (attention deficit disorder)   . Anxiety   . Arthritis    both knees right worse than left  . Carpal tunnel syndrome of right wrist   . Depression   . Essential hypertension, benign   . Gallstones   . History of smoking 06/22/2016  . Hyperlipidemia associated with type 2 diabetes mellitus ([place]), on Zocor 04/04/2011  . Hypertension associated with diabetes ([place]) 06/03/2008  . Migraines   . Morbid obesity ([place])   . Morbid obesity with BMI of 50.0-59.9, adult ([place]) 07/12/2006  . NICM (nonischemic cardiomyopathy) ([place]) 07/12/2006   01/16/18 ECHO:   - Procedure narrative: Transthoracic echocardiography. Image   quality was suboptimal. The study was technically difficult.   Intravenous contrast (Definity) was administered. - Left ventricle: The cavity size was moderately dilated. Wall   thickness was increased in a pattern of mild LVH. Systolic   function was moderately to severely reduced. The estimated   ejection fraction w  . Normal coronary arteries 04/17/2016  . OSA on CPAP 10/28/2014  . Spinal stenosis    back pain  . Spondylosis without myelopathy or radiculopathy, lumbar region 12/04/2017  . Type 2 diabetes mellitus with hyperglycemia [facility])     Past Surgical History:  Procedure Laterality Date  . CHOLECYSTECTOMY    . DILATATION & CURETTAGE/HYSTEROSCOPY WITH MYOSURE N/A 10/31/2017   Procedure: DILATATION & CURETTAGE/HYSTEROSCOPY WITH MYOSURE;  Surgeon: Salvadore Dom, MD;  Location: [facility];  Service: Gynecology;  Laterality: N/A;  .  RIGHT/LEFT HEART CATH AND CORONARY ANGIOGRAPHY N/A 04/11/2016   Procedure: Right/Left Heart Cath and Coronary Angiography;  Surgeon: Burnell Blanks, MD;  Location: [facility];  Service: Cardiovascular;  Laterality: N/A;  . sonogram for blood clots     no blockages    Family Psychiatric History:   Family History:  Family History  Problem  Relation Age of Onset  . Depression Mother   . Anxiety disorder Mother   . Diabetes Mother   . Hypertension Mother   . ADD / ADHD Father   . Diabetes Father   . Hypertension Father   . Hypertension Other   . Diabetes Other   . Colitis Other   . Alcohol abuse Other     Social History:  Social History   Socioeconomic History  . Marital status: Married    Spouse name: Not on file  . Number of children: Not on file  . Years of education: Not on file  . Highest education level: Not on file  Occupational History  . Not on file  Tobacco Use  . Smoking status: Former Smoker    Packs/day: 0.50    Types: Cigarettes    Quit date: 02/25/1997    Years since quitting: 23.3  . Smokeless tobacco: Never Used  . Tobacco comment: Married, lives with spouse (when he is not traveling) and son  Vaping Use  . Vaping Use: Never used  Substance and Sexual Activity  . Alcohol use: No  . Drug use: No  . Sexual activity: Yes    Partners: Male    Birth control/protection: None  Other Topics Concern  . Not on file  Social History Narrative  . Not on file   Social Determinants of Health   Financial Resource Strain: Not on file  Food Insecurity: Not on file  Transportation Needs: Not on file  Physical Activity: Not on file  Stress: Not on file  Social Connections: Not on file    Allergies:  Allergies  Allergen Reactions  . Fluoxetine Other (See Comments)    More depressed  . Cymbalta [Duloxetine Hcl] Other (See Comments)    depressed  . Penicillins     REACTION: rash Has patient had a PCN reaction causing immediate rash, facial/tongue/throat swelling, SOB or lightheadedness with hypotension: YES Has patient had a PCN reaction causing severe rash involving mucus membranes or skin necrosis: NO Has patient had a PCN reaction that required hospitalization: YES Has patient had a PCN reaction occurring within the last 10 years: NO If all of the above answers are "NO", then may proceed  with Cephalosporin use.     Metabolic Disorder Labs: Lab Results  Component Value Date   HGBA1C 7.1 (A) 02/10/2020   MPG 139.85 10/29/2017   MPG 125.5 10/19/2016   MPG 125.5 10/19/2016   No results found for: PROLACTIN Lab Results  Component Value Date   CHOL 193 02/10/2020   TRIG 147.0 02/10/2020   HDL 48.90 02/10/2020   CHOLHDL 4 02/10/2020   VLDL 29.4 02/10/2020   LDLCALC 115 (H) 02/10/2020   LDLCALC 128 (H) 07/18/2016   Lab Results  Component Value Date   TSH 4.260 06/15/2020   TSH 3.56 02/10/2020    Therapeutic Level Labs: No results found for: LITHIUM No results found for: VALPROATE No components found for:  CBMZ  Current Medications: Current Outpatient Medications  Medication Sig Dispense Refill  . amLODipine (NORVASC) 5 MG tablet Take 1 tablet by mouth daily.    [name]  ARIPiprazole (ABILIFY) 2 MG tablet TAKE 1 TABLET BY MOUTH EVERY DAY 30 tablet 0  . busPIRone (BUSPAR) 15 MG tablet TAKE 1 TABLET BY MOUTH 2 TIMES DAILY. 60 tablet 0  . carvedilol (COREG) 12.5 MG tablet Take 1 tablet (12.5 mg total) by mouth 2 (two) times daily with a meal. 180 tablet 1  . clotrimazole-betamethasone (LOTRISONE) cream APPLY TO AFFECTED AREA TWICE A DAY 45 g 2  . Dapagliflozin-metFORMIN HCl ER (XIGDUO XR) 11-998 MG TB24 Take 1 tablet by mouth daily. 90 tablet 3  . furosemide (LASIX) 80 MG tablet Take 1 tablet (80 mg total) by mouth 2 (two) times daily. 60 tablet 11  . LORazepam (ATIVAN) 0.5 MG tablet Take 1 tablet (0.5 mg total) by mouth 2 (two) times daily as needed for anxiety. 10 tablet 1  . potassium chloride SA (KLOR-CON M20) 20 MEQ tablet Take 2 tablets (40 mEq total) by mouth daily. 180 tablet 3  . sacubitril-valsartan (ENTRESTO) 49-51 MG Take 1 tablet by mouth 2 (two) times daily. 60 tablet 2  . sertraline (ZOLOFT) 100 MG tablet Take 2 tablets (200 mg total) by mouth daily. 60 tablet 0  . simvastatin (ZOCOR) 20 MG tablet TAKE 1 TABLET BY MOUTH EVERY DAY 90 tablet 2   No  current facility-administered medications for this visit.     Musculoskeletal:   Psychiatric Specialty Exam: please note limitations in getting a full MSE in the context of phone based appointment Review of Systems dyspnea on exertion  There were no vitals taken for this visit.There is no height or weight on file to calculate BMI.  General Appearance: NA  Eye Contact:  NA  Speech:  Normal Rate  Volume:  Normal  Mood: improving mood   Affect:less constricted , smiles at times appropriately   Thought Process:  Linear and Descriptions of Associations: Intact  Orientation:  Full (Time, Place, and Person)  Thought Content: No hallucinations, no delusions   Suicidal Thoughts:  No - no suicidal plan or intention, identifies son Regis Bill as protective factor  Homicidal Thoughts:  No  Memory:  Recent and remote grossly intact  Judgement:  Present   Insight:  Present   Psychomotor Activity:  NA  Concentration:  Concentration: Good and Attention Span: Good  Recall:  Good  Fund of Knowledge: Good  Language: Good  Akathisia:  Negative  Handed:  Right  AIMS (if indicated):   Assets:  Communication Skills Desire for Improvement Resilience  ADL's:  Intact  Cognition: WNL  Sleep:  Good   Screenings: AUDIT   Flowsheet Row Admission (Discharged) from 12/05/2013 in [place]  Alcohol Use Disorder Identification Test Final Score (AUDIT) 0    GAD-7   Flowsheet Row Counselor from 05/23/2020 in [place]  Total GAD-7 Score 10    PHQ2-9   Flowsheet Row Counselor from 05/23/2020 in [place] [facility] Visit from 02/10/2020 in [place] at [facility] Visit from 11/07/2017 in [place] Visit from 10/18/2016 in [place] from 06/26/2016 in [place]  PHQ-2 Total Score 6 0 2 5 4    PHQ-9 Total Score 23 0 11 8 21     Flowsheet Row Counselor from 05/23/2020 in [place] No Risk       Assessment and Plan:   Partially improved mood and noted to exhibit fuller range of affect .  No SI, and presents future oriented . Does endorse some residual sadness, sense of anhedonia, although improved . Denies medication side effects at this time, and appears to be responding well to Zoloft trial. She is currently on 100 mgr QDAY    Increase Zoloft to 150 mgr QDAY for depression and anxiety   Continue  Ativan 0.5 mgr BID PRN for severe anxiety Continue  Abilify 2 mgr QDAY for augmentation Continue Buspar 15 mgr BID for anxiety   Reviewed side effects, including potential risk of serotonin syndrome   Continue individual therapy.  Will see again in 4 weeks, agrees to contact clinic sooner if any worsening prior to medication concerns  * Expressed interest in couples therapy, which I have encouraged, but unsure if husband would agree.    Jenne Campus, MD 07/11/2020, 2:22 PM

## 2020-07-12 ENCOUNTER — Telehealth: Payer: Self-pay | Admitting: Internal Medicine

## 2020-07-12 ENCOUNTER — Other Ambulatory Visit: Payer: Self-pay | Admitting: Neurological Surgery

## 2020-07-12 DIAGNOSIS — M542 Cervicalgia: Secondary | ICD-10-CM

## 2020-07-12 DIAGNOSIS — M5416 Radiculopathy, lumbar region: Secondary | ICD-10-CM

## 2020-07-12 NOTE — Telephone Encounter (Signed)
See below

## 2020-07-12 NOTE — Telephone Encounter (Signed)
See below for Dr. Nathanial Millman recommendations.

## 2020-07-12 NOTE — Telephone Encounter (Signed)
Noted. Will await for referring physicians response prior to routing to PCP for recommendation.

## 2020-07-12 NOTE — Telephone Encounter (Signed)
I would recommend she see her PCP for long term management of her chronic pain. I apologize she did not have a good experience.

## 2020-07-12 NOTE — Telephone Encounter (Signed)
Patient calling, patient had a bad experience at [facility] yesterday, they wanted to do a procedure to help with her back pain and patient refused because she doesn't want surgery. Patient asked them if they could give her any medication and they said they do not give pain medicine she would have to visit pain management. Patient unsure what she should do from here.

## 2020-07-17 ENCOUNTER — Ambulatory Visit (INDEPENDENT_AMBULATORY_CARE_PROVIDER_SITE_OTHER): Payer: Medicare Other | Admitting: Licensed Clinical Social Worker

## 2020-07-17 ENCOUNTER — Other Ambulatory Visit: Payer: Self-pay

## 2020-07-17 DIAGNOSIS — F332 Major depressive disorder, recurrent severe without psychotic features: Secondary | ICD-10-CM | POA: Diagnosis not present

## 2020-07-17 DIAGNOSIS — F988 Other specified behavioral and emotional disorders with onset usually occurring in childhood and adolescence: Secondary | ICD-10-CM | POA: Diagnosis not present

## 2020-07-17 NOTE — Progress Notes (Signed)
Virtual Visit via Telephone Note   I connected with [name] on 07/17/20 at 10:00am by telephone and verified that I am speaking with the correct person using two identifiers.   I discussed the limitations, risks, security and privacy concerns of performing an evaluation and management service by telephone and the availability of in person appointments. I also discussed with the patient that there may be a patient responsible charge related to this service. The patient expressed understanding and agreed to proceed.   I discussed the assessment and treatment plan with the patient. The patient was provided an opportunity to ask questions and all were answered. The patient agreed with the plan and demonstrated an understanding of the instructions.   The patient was advised to call back or seek an in-person evaluation if the symptoms worsen or if the condition fails to improve as anticipated.   I provided 1 hour of non-face-to-face time during this encounter.     Shade Flood, LCSW, LCAS ________________________________ THERAPIST PROGRESS NOTE   Session Time: 10:00am - 11:00am  Location: Patient: Patient Home Provider: [facility] [place] [facility]    Participation Level: Active   Behavioral Response: Alert, anxious mood   Type of Therapy:  Individual Therapy   Treatment Goals addressed: Depression, panic attack, and anxiety management; Medication Compliance; Monitoring substance use   Interventions: CBT, scheduling self-care activities    Summary: [name] is a 55 year old married Caucasian female on disability that presented today for therapy appointment with diagnoses of Major depressive disorder, recurrent, severe with anxious distress; and ADD.         Suicidal/Homicidal: None; without plan or intent.    Therapist Response: Clinician spoke with [name] for telephone appointment today due to her inability to travel or access video meetings.  Clinician assessed for safety, sobriety, and  medication compliance.  [name] spoke in a manner that was alert, oriented x5, with no evidence or self-report of active SI/HI or A/V H.  [name] reported that she continues taking medication responsibly and denied using alcohol or illicit substances since last session.  Clinician inquired about [name]'s emotional ratings today, as well as any significant changes in thoughts, feelings, or behavior since last session. [name] reported scores of 5/10 for depression, 8/10 for anxiety, and 3/10 for irritability.  [name] reported one panic attack since last session, but could not recall what day or what triggered this.  [name] reported that one success was attending a doctor's appointment with a neurologist recently, and she will schedule an MRI soon.  She reported that she also bought a new mobility scooter, although it didn't meet her expectations, so now she is stressed about when she can get rid of it.  [name] reported that her irritability is up due to recent stressors, and she has found it difficult to make progress on self-care at times, stating "Just talking about it gets my stomach in a knot.  I get paralyzed when it comes to these things.  There is this hesitancy to do anything about it".  Clinician suggested [name] consider creating a realistic weekly schedule she can include various self-care activities in as a healthy outlet for stressors, and explored options with her which might be appealing and not too taxing mentally or physically.  [name] acknowledged that when she is active, her mood tends to improve, and she would like to do at least one thing each day to curb procrastination such as working in her garden, visit yard Psychiatric nurse shops, or take  pictures as part of photography hobby.  Intervention was effective, as evidenced by [name] stating "I think its always helpful to talk about these things so I can express what I go through each week and get feedback from you.  I know I'll feel better once I force myself  to do these things more often".  Clinician encouraged [name] to maintain abstinence from illicit substances, substitute for healthier activities such as those discussed today, and will continue to monitor      Plan: Follow up again in 1 week.   Diagnosis: Major depressive disorder, recurrent, severe with anxious distress; and ADD.   Shade Flood, LCSW, LCAS 07/17/20

## 2020-07-25 ENCOUNTER — Encounter: Payer: Self-pay | Admitting: Internal Medicine

## 2020-07-25 ENCOUNTER — Other Ambulatory Visit: Payer: Self-pay

## 2020-07-25 ENCOUNTER — Ambulatory Visit (INDEPENDENT_AMBULATORY_CARE_PROVIDER_SITE_OTHER): Payer: Medicare Other | Admitting: Internal Medicine

## 2020-07-25 ENCOUNTER — Other Ambulatory Visit: Payer: Self-pay | Admitting: Internal Medicine

## 2020-07-25 VITALS — BP 128/68 | HR 92 | Temp 98.1°F | Resp 16 | Ht 64.0 in | Wt 335.0 lb

## 2020-07-25 DIAGNOSIS — M47816 Spondylosis without myelopathy or radiculopathy, lumbar region: Secondary | ICD-10-CM

## 2020-07-25 DIAGNOSIS — Z23 Encounter for immunization: Secondary | ICD-10-CM | POA: Insufficient documentation

## 2020-07-25 DIAGNOSIS — Z1159 Encounter for screening for other viral diseases: Secondary | ICD-10-CM | POA: Diagnosis not present

## 2020-07-25 DIAGNOSIS — Z1231 Encounter for screening mammogram for malignant neoplasm of breast: Secondary | ICD-10-CM

## 2020-07-25 DIAGNOSIS — M545 Low back pain, unspecified: Secondary | ICD-10-CM | POA: Diagnosis not present

## 2020-07-25 DIAGNOSIS — Z9989 Dependence on other enabling machines and devices: Secondary | ICD-10-CM

## 2020-07-25 DIAGNOSIS — G8929 Other chronic pain: Secondary | ICD-10-CM | POA: Diagnosis not present

## 2020-07-25 DIAGNOSIS — K5904 Chronic idiopathic constipation: Secondary | ICD-10-CM | POA: Insufficient documentation

## 2020-07-25 DIAGNOSIS — E1165 Type 2 diabetes mellitus with hyperglycemia: Secondary | ICD-10-CM

## 2020-07-25 DIAGNOSIS — B353 Tinea pedis: Secondary | ICD-10-CM | POA: Diagnosis not present

## 2020-07-25 DIAGNOSIS — J301 Allergic rhinitis due to pollen: Secondary | ICD-10-CM | POA: Diagnosis not present

## 2020-07-25 DIAGNOSIS — G4733 Obstructive sleep apnea (adult) (pediatric): Secondary | ICD-10-CM | POA: Diagnosis not present

## 2020-07-25 DIAGNOSIS — D225 Melanocytic nevi of trunk: Secondary | ICD-10-CM | POA: Insufficient documentation

## 2020-07-25 LAB — HEMOGLOBIN A1C: Hgb A1c MFr Bld: 6.7 % — ABNORMAL HIGH (ref 4.6–6.5)

## 2020-07-25 MED ORDER — CICLOPIROX OLAMINE 0.77 % EX CREA: TOPICAL_CREAM | Freq: Two times a day (BID) | CUTANEOUS | 1 refills | Status: DC

## 2020-07-25 MED ORDER — SHINGRIX 50 MCG/0.5ML IM SUSR: 0.5000 mL | Freq: Once | INTRAMUSCULAR | 1 refills | Status: AC

## 2020-07-25 MED ORDER — FLUTICASONE PROPIONATE 50 MCG/ACT NA SUSP: 2.0000 | Freq: Every day | NASAL | 1 refills | Status: DC

## 2020-07-25 MED ORDER — LINACLOTIDE 145 MCG PO CAPS: 145.0000 ug | ORAL_CAPSULE | Freq: Every day | ORAL | 1 refills | Status: DC

## 2020-07-25 MED ORDER — TRULANCE 3 MG PO TABS: 1.0000 | ORAL_TABLET | Freq: Every day | ORAL | 1 refills | Status: DC

## 2020-07-25 MED ORDER — TRAMADOL HCL 50 MG PO TABS: 50.0000 mg | ORAL_TABLET | Freq: Two times a day (BID) | ORAL | 5 refills | Status: DC | PRN

## 2020-07-25 NOTE — Patient Instructions (Signed)
Type 2 Diabetes Mellitus, Diagnosis, Adult Type 2 diabetes (type 2 diabetes mellitus) is a long-term, or chronic, disease. In type 2 diabetes, one or both of these problems may be present:  The pancreas does not make enough of a hormone called insulin.  Cells in the body do not respond properly to insulin that the body makes (insulin resistance). Normally, insulin allows blood sugar (glucose) to enter cells in the body. The cells use glucose for energy. Insulin resistance or lack of insulin causes excess glucose to build up in the blood instead of going into cells. This causes high blood glucose (hyperglycemia).  What are the causes? The exact cause of type 2 diabetes is not known. What increases the risk? The following factors may make you more likely to develop this condition:  Having a family member with type 2 diabetes.  Being overweight or obese.  Being inactive (sedentary).  Having been diagnosed with insulin resistance.  Having a history of prediabetes, diabetes when you were pregnant (gestational diabetes), or polycystic ovary syndrome (PCOS). What are the signs or symptoms? In the early stage of this condition, you may not have symptoms. Symptoms develop slowly and may include:  Increased thirst or hunger.  Increased urination.  Unexplained weight loss.  Tiredness (fatigue) or weakness.  Vision changes, such as blurry vision.  Dark patches on the skin. How is this diagnosed? This condition is diagnosed based on your symptoms, your medical history, a physical exam, and your blood glucose level. Your blood glucose may be checked with one or more of the following blood tests:  A fasting blood glucose (FBG) test. You will not be allowed to eat (you will fast) for 8 hours or longer before a blood sample is taken.  A random blood glucose test. This test checks blood glucose at any time of day regardless of when you ate.  An A1C (hemoglobin A1C) blood test. This test  provides information about blood glucose levels over the previous 2-3 months.  An oral glucose tolerance test (OGTT). This test measures your blood glucose at two times: ? After fasting. This is your baseline blood glucose level. ? Two hours after drinking a beverage that contains glucose. You may be diagnosed with type 2 diabetes if:  Your fasting blood glucose level is 126 mg/dL (7.0 mmol/L) or higher.  Your random blood glucose level is 200 mg/dL (11.1 mmol/L) or higher.  Your A1C level is 6.5% or higher.  Your oral glucose tolerance test result is higher than 200 mg/dL (11.1 mmol/L). These blood tests may be repeated to confirm your diagnosis.   How is this treated? Your treatment may be managed by a specialist called an endocrinologist. Type 2 diabetes may be treated by following instructions from your health care provider about:  Making dietary and lifestyle changes. These may include: ? Following a personalized nutrition plan that is developed by a registered dietitian. ? Exercising regularly. ? Finding ways to manage stress.  Checking your blood glucose level as often as told.  Taking diabetes medicines or insulin daily. This helps to keep your blood glucose levels in the healthy range.  Taking medicines to help prevent complications from diabetes. Medicines may include: ? Aspirin. ? Medicine to lower cholesterol. ? Medicine to control blood pressure. Your health care provider will set treatment goals for you. Your goals will be based on your age, other medical conditions you have, and how you respond to diabetes treatment. Generally, the goal of treatment is to maintain the   following blood glucose levels:  Before meals: 80-130 mg/dL (4.4-7.2 mmol/L).  After meals: below 180 mg/dL (10 mmol/L).  A1C level: less than 7%. Follow these instructions at home: Questions to ask your health care provider Consider asking the following questions:  Should I meet with a certified  diabetes care and education specialist?  What diabetes medicines do I need, and when should I take them?  What equipment will I need to manage my diabetes at home?  How often do I need to check my blood glucose?  Where can I find a support group for people with diabetes?  What number can I call if I have questions?  When is my next appointment? General instructions  Take over-the-counter and prescription medicines only as told by your health care provider.  Keep all follow-up visits as told by your health care provider. This is important. Where to find more information  American Diabetes Association (ADA): www.diabetes.org  American Association of Diabetes Care and Education Specialists (ADCES): www.diabeteseducator.org  International Diabetes Federation (IDF): www.idf.org Contact a health care provider if:  Your blood glucose is at or above 240 mg/dL (13.3 mmol/L) for 2 days in a row.  You have been sick or have had a fever for 2 days or longer, and you are not getting better.  You have any of the following problems for more than 6 hours: ? You cannot eat or drink. ? You have nausea and vomiting. ? You have diarrhea. Get help right away if:  You have severe hypoglycemia. This means your blood glucose is lower than 54 mg/dL (3.0 mmol/L).  You become confused or you have trouble thinking clearly.  You have difficulty breathing.  You have moderate or large ketone levels in your urine. These symptoms may represent a serious problem that is an emergency. Do not wait to see if the symptoms will go away. Get medical help right away. Call your local emergency services (911 in the [place] Do not drive yourself to the hospital. Summary  Type 2 diabetes (type 2 diabetes mellitus) is a long-term, or chronic, disease. In type 2 diabetes, the pancreas does not make enough of a hormone called insulin, or cells in the body do not respond properly to insulin that the body makes (insulin  resistance).  This condition is treated by making dietary and lifestyle changes and taking diabetes medicines or insulin.  Your health care provider will set treatment goals for you. Your goals will be based on your age, other medical conditions you have, and how you respond to diabetes treatment.  Keep all follow-up visits as told by your health care provider. This is important. This information is not intended to replace advice given to you by your health care provider. Make sure you discuss any questions you have with your health care provider. Document Revised: 09/08/2019 Document Reviewed: 09/08/2019 Elsevier Patient Education  2021 [employer]  

## 2020-07-25 NOTE — Progress Notes (Signed)
Subjective:  Patient ID: [name], female    DOB: 12/21/1965  Age: 55 y.o. MRN: [id number]  CC: Diabetes and Rash  This visit occurred during the SARS-CoV-2 public health emergency.  Safety protocols were in place, including screening questions prior to the visit, additional usage of staff PPE, and extensive cleaning of exam room while observing appropriate contact time as indicated for disinfecting solutions.    HPI Zhuri Krass presents for f/up -   She complains of pigmented lesions on the front and back of her torso.  She complains of an itchy rash on her right foot.  She has chronic constipation.  She complains of low back pain, hip pain, knee pain.  She recently saw a neurosurgeon and tells me that an MRI is scheduled of her lumbar spine.  She is controlling the pain with Motrin and tramadol.  She tells me she is about to run out of tramadol.  She has a history of sleep apnea but is not currently using a CPAP machine.  She complains of chronic nasal congestion.  Her shortness of breath has recently improved.  She denies chest pain, diaphoresis, dizziness, or lightheadedness.  She complains of weight gain and does not exercise because she experiences too much musculoskeletal pain.  Outpatient Medications Prior to Visit  Medication Sig Dispense Refill  . amLODipine (NORVASC) 5 MG tablet Take 1 tablet by mouth daily.    . ARIPiprazole (ABILIFY) 2 MG tablet TAKE 1 TABLET BY MOUTH EVERY DAY 30 tablet 0  . busPIRone (BUSPAR) 15 MG tablet TAKE 1 TABLET BY MOUTH 2 TIMES DAILY. 60 tablet 0  . carvedilol (COREG) 12.5 MG tablet Take 1 tablet (12.5 mg total) by mouth 2 (two) times daily with a meal. 180 tablet 1  . clotrimazole-betamethasone (LOTRISONE) cream APPLY TO AFFECTED AREA TWICE A DAY 45 g 2  . Dapagliflozin-metFORMIN HCl ER (XIGDUO XR) 11-998 MG TB24 Take 1 tablet by mouth daily. 90 tablet 3  . furosemide (LASIX) 80 MG tablet Take 1 tablet (80 mg total) by mouth 2 (two) times daily. 60  tablet 11  . LORazepam (ATIVAN) 0.5 MG tablet Take 1 tablet (0.5 mg total) by mouth 2 (two) times daily as needed for anxiety. 14 tablet 1  . potassium chloride SA (KLOR-CON M20) 20 MEQ tablet Take 2 tablets (40 mEq total) by mouth daily. 180 tablet 3  . sacubitril-valsartan (ENTRESTO) 49-51 MG Take 1 tablet by mouth 2 (two) times daily. 60 tablet 2  . sertraline (ZOLOFT) 100 MG tablet Take 1.5 tablets (150 mg total) by mouth daily. 45 tablet 1  . simvastatin (ZOCOR) 20 MG tablet TAKE 1 TABLET BY MOUTH EVERY DAY 90 tablet 2   No facility-administered medications prior to visit.    ROS Review of Systems  Constitutional: Positive for unexpected weight change (wt gain). Negative for appetite change, chills, diaphoresis and fatigue.  HENT: Positive for congestion. Negative for facial swelling, nosebleeds, postnasal drip, rhinorrhea, sinus pressure, sinus pain, sneezing, sore throat and trouble swallowing.   Eyes: Negative for visual disturbance.  Respiratory: Positive for shortness of breath. Negative for cough, chest tightness and wheezing.   Cardiovascular: Negative for chest pain, palpitations and leg swelling.  Gastrointestinal: Positive for constipation. Negative for abdominal pain, blood in stool, nausea and vomiting.  Endocrine: Negative.   Genitourinary: Negative.  Negative for difficulty urinating, dysuria and frequency.  Musculoskeletal: Positive for arthralgias and back pain. Negative for gait problem and myalgias.  Skin: Negative.   Neurological: Negative.  Negative for dizziness, weakness, light-headedness, numbness and headaches.  Hematological: Negative for adenopathy. Does not bruise/bleed easily.  Psychiatric/Behavioral: Positive for decreased concentration and dysphoric mood. Negative for agitation, behavioral problems, confusion, hallucinations, self-injury, sleep disturbance and suicidal ideas. The patient is nervous/anxious.     Objective:  BP 128/68 (BP Location: Left  Arm, Patient Position: Sitting, Cuff Size: Large)   Pulse 92   Temp 98.1 F (36.7 C) (Oral)   Resp 16   Ht 5\' 4"  (1.626 m)   Wt (!) 335 lb (152 kg)   SpO2 95%   BMI 57.50 kg/m   BP Readings from Last 3 Encounters:  07/25/20 128/68  07/07/20 115/77  06/30/20 132/84    Wt Readings from Last 3 Encounters:  07/25/20 (!) 335 lb (152 kg)  07/07/20 (!) 333 lb 9.6 oz (151.3 kg)  06/30/20 (!) 333 lb 6.4 oz (151.2 kg)    Physical Exam Vitals reviewed.  Constitutional:      Appearance: She is obese.  HENT:     Nose: Nose normal. No mucosal edema, congestion or rhinorrhea.     Right Sinus: No maxillary sinus tenderness or frontal sinus tenderness.     Left Sinus: No maxillary sinus tenderness or frontal sinus tenderness.     Mouth/Throat:     Mouth: Mucous membranes are moist.  Eyes:     General: No scleral icterus.    Conjunctiva/sclera: Conjunctivae normal.  Cardiovascular:     Rate and Rhythm: Normal rate and regular rhythm.     Heart sounds: No murmur heard.   Pulmonary:     Effort: Pulmonary effort is normal.     Breath sounds: No stridor. No wheezing, rhonchi or rales.  Abdominal:     General: Abdomen is protuberant. Bowel sounds are normal. There is no distension.     Palpations: Abdomen is soft. There is no hepatomegaly, splenomegaly or mass.     Tenderness: There is no abdominal tenderness.     Hernia: No hernia is present.  Musculoskeletal:        General: Normal range of motion.     Cervical back: Neck supple.     Right lower leg: No edema.     Left lower leg: No edema.  Lymphadenopathy:     Cervical: No cervical adenopathy.  Skin:    General: Skin is warm and dry.     Coloration: Skin is not pale.     Findings: Rash present.     Comments: Right heel and plantar side of the foot shows slightly erythematous papules with scale.  On her anterior and posterior torso she has raised, dome-shaped, hyperpigmented epidermal lesions.  Neurological:     General: No  focal deficit present.     Mental Status: She is alert and oriented to person, place, and time. Mental status is at baseline.  Psychiatric:        Mood and Affect: Mood normal.        Behavior: Behavior normal.     Lab Results  Component Value Date   WBC 11.1 (H) 06/15/2020   HGB 14.2 06/15/2020   HCT 43.5 06/15/2020   PLT 262 06/15/2020   GLUCOSE 128 (H) 06/19/2020   CHOL 193 02/10/2020   TRIG 147.0 02/10/2020   HDL 48.90 02/10/2020   LDLDIRECT 143.8 04/03/2011   LDLCALC 115 (H) 02/10/2020   ALT 25 02/10/2020   AST 21 02/10/2020   NA 141 06/19/2020   K 4.2 06/19/2020   CL 99 06/19/2020  CREATININE 0.87 06/19/2020   BUN 22 06/19/2020   CO2 28 06/19/2020   TSH 4.260 06/15/2020   INR 1.0 04/08/2016   HGBA1C 6.7 (H) 07/25/2020   MICROALBUR 3.6 (H) 02/10/2020    DG Chest 2 View  Result Date: 06/22/2020 CLINICAL DATA:  Shortness of breath EXAM: CHEST - 2 VIEW COMPARISON:  Jul 14, 2017. FINDINGS: The heart size and mediastinal contours are within normal limits. No focal consolidation. No pleural effusion. No pneumothorax. Unchanged dextroconvex curvature of thoracic spine. IMPRESSION: No active cardiopulmonary disease. Electronically Signed   By: Dahlia Bailiff MD   On: 06/22/2020 23:27    Assessment & Plan:   Suttyn was seen today for diabetes and rash.  Diagnoses and all orders for this visit:  Type 2 diabetes mellitus with hyperglycemia, without long-term current use of insulin ([place])- Her blood sugar is adequately well controlled.  Will continue the combination of metformin and an SGLT2 inhibitor. -     Hemoglobin A1c; Future -     Ambulatory referral to Ophthalmology -     Hemoglobin A1c  Need for hepatitis C screening test -     Hepatitis C antibody; Future -     Hepatitis C antibody  Spondylosis without myelopathy or radiculopathy, lumbar region -     traMADol (ULTRAM) 50 MG tablet; Take 1 tablet (50 mg total) by mouth every 12 (twelve) hours as  needed.  Morbid obesity ([place])- She agrees to start doing some water aerobics.  Visit for screening mammogram  OSA on CPAP -     Ambulatory referral to Sleep Studies  Chronic bilateral low back pain without sciatica -     traMADol (ULTRAM) 50 MG tablet; Take 1 tablet (50 mg total) by mouth every 12 (twelve) hours as needed.  Athlete's foot on right -     ciclopirox (LOPROX) 0.77 % cream; Apply topically 2 (two) times daily.  Atypical nevus of back -     Ambulatory referral to Dermatology  Need for shingles vaccine -     Zoster Vaccine Adjuvanted [facility]) injection; Inject 0.5 mLs into the muscle once for 1 dose.  Non-seasonal allergic rhinitis due to pollen -     fluticasone (FLONASE) 50 MCG/ACT nasal spray; Place 2 sprays into both nostrils daily.  Chronic idiopathic constipation -     Discontinue: Plecanatide (TRULANCE) 3 MG TABS; Take 1 tablet by mouth daily. -     linaclotide (LINZESS) 145 MCG CAPS capsule; Take 1 capsule (145 mcg total) by mouth daily before breakfast.   I have discontinued Elissa Brayfield's Trulance. I am also having her start on traMADol, ciclopirox, Shingrix, fluticasone, and linaclotide. Additionally, I am having her maintain her carvedilol, amLODipine, clotrimazole-betamethasone, simvastatin, furosemide, potassium chloride SA, Xigduo XR, Entresto, busPIRone, ARIPiprazole, LORazepam, and sertraline.  Meds ordered this encounter  Medications  . traMADol (ULTRAM) 50 MG tablet    Sig: Take 1 tablet (50 mg total) by mouth every 12 (twelve) hours as needed.    Dispense:  60 tablet    Refill:  5  . ciclopirox (LOPROX) 0.77 % cream    Sig: Apply topically 2 (two) times daily.    Dispense:  90 g    Refill:  1  . Zoster Vaccine Adjuvanted [facility]) injection    Sig: Inject 0.5 mLs into the muscle once for 1 dose.    Dispense:  0.5 mL    Refill:  1  . fluticasone (FLONASE) 50 MCG/ACT nasal spray    Sig: Place  2 sprays into both nostrils daily.     Dispense:  48 g    Refill:  1  . DISCONTD: Plecanatide (TRULANCE) 3 MG TABS    Sig: Take 1 tablet by mouth daily.    Dispense:  90 tablet    Refill:  1  . linaclotide (LINZESS) 145 MCG CAPS capsule    Sig: Take 1 capsule (145 mcg total) by mouth daily before breakfast.    Dispense:  90 capsule    Refill:  1     Follow-up: Return in about 6 months (around 01/24/2021).  [name], MD

## 2020-07-26 ENCOUNTER — Ambulatory Visit (INDEPENDENT_AMBULATORY_CARE_PROVIDER_SITE_OTHER): Payer: Medicare Other | Admitting: Psychiatry

## 2020-07-26 ENCOUNTER — Encounter (HOSPITAL_COMMUNITY): Payer: Self-pay | Admitting: Psychiatry

## 2020-07-26 DIAGNOSIS — F3341 Major depressive disorder, recurrent, in partial remission: Secondary | ICD-10-CM

## 2020-07-26 LAB — HEPATITIS C ANTIBODY
Hepatitis C Ab: NONREACTIVE
SIGNAL TO CUT-OFF: 0.01 (ref <1.00)

## 2020-07-26 MED ORDER — LORAZEPAM 0.5 MG PO TABS: 0.5000 mg | ORAL_TABLET | Freq: Two times a day (BID) | ORAL | 1 refills | Status: DC | PRN

## 2020-07-26 MED ORDER — BUSPIRONE HCL 15 MG PO TABS: 15.0000 mg | ORAL_TABLET | Freq: Two times a day (BID) | ORAL | 1 refills | Status: DC

## 2020-07-26 MED ORDER — ARIPIPRAZOLE 2 MG PO TABS: 2.0000 mg | ORAL_TABLET | Freq: Every day | ORAL | 1 refills | Status: DC

## 2020-07-26 MED ORDER — SERTRALINE HCL 100 MG PO TABS: 150.0000 mg | ORAL_TABLET | Freq: Every day | ORAL | 1 refills | Status: DC

## 2020-07-26 NOTE — Progress Notes (Signed)
BH MD/PA/NP OP Progress Note  07/26/2020 1:15 PM [name]  MRN:  [id number]  Chief Complaint: Patient returns for medication management appointment  This appointment was conducted face to face/ in person .    Duration - 40 minutes   HPI: 55 year old female.  History of MDD, Anxiety Disorder ( consider GAD).   Patient presents with her husband, [place].  She acknowledges she is feeling better since he returned , and does present with improved mood and a fuller range of affect . No SI. No psychotic symptoms.  She reports that her husband's unannounced trips overseas ( to [place]) have been an ongoing significant stressor for her not only because of her feeling betrayed by his leaving unannounced but also because during his absences daily activities and upkeep, such as paying bills,  car and home maintenance become much more difficult and describes how during his most recent episode of being away her clothes washing machine  malfunctioned resulting in a leak which caused significant damage which if he had been home could have been repaired immediately without further issue.   She is aware that he has a child out of wedlock in [place] , and he states that these trips are to see his child and to try to navigate the [place] court system to get custody of the child and be able to bring him to [place]. She states she does not believe he is telling the whole story and that she feels he may still have a relationship with a woman there, which he adamantly denies .  In any event, she states that at this time their relationship is "OK" but that she no longer trusts him as she used to and that if he leaves unannounced again she will divorce him. He states he has no plans to leave unannounced and that if he does have to go out of the country again he will bring her with him.  From a clinical perspective , she presents with noticeable improvement and a fuller range of affect today. She acknowledges that "  when he is not around my depression just gets so much worse ".  Currently denies SI. No psychotic symptoms. She does endorse some persistent low energy level and vague anhedonia .  Denies medication side effects.           Visit Diagnosis: MDD, consider GAD versus Adjustment Disorder  Past Psychiatric History:   Past Medical History:  Past Medical History:  Diagnosis Date  . ADD (attention deficit disorder)   . Anxiety   . Arthritis    both knees right worse than left  . Carpal tunnel syndrome of right wrist   . Depression   . Essential hypertension, benign   . Gallstones   . History of smoking 06/22/2016  . Hyperlipidemia associated with type 2 diabetes mellitus ([place]), on Zocor 04/04/2011  . Hypertension associated with diabetes ([place]) 06/03/2008  . Migraines   . Morbid obesity ([place])   . Morbid obesity with BMI of 50.0-59.9, adult ([place]) 07/12/2006  . NICM (nonischemic cardiomyopathy) ([place]) 07/12/2006   01/16/18 ECHO:   - Procedure narrative: Transthoracic echocardiography. Image   quality was suboptimal. The study was technically difficult.   Intravenous contrast (Definity) was administered. - Left ventricle: The cavity size was moderately dilated. Wall   thickness was increased in a pattern of mild LVH. Systolic   function was moderately to severely reduced. The estimated   ejection fraction w  . Normal coronary arteries 04/17/2016  .  OSA on CPAP 10/28/2014  . Spinal stenosis    back pain  . Spondylosis without myelopathy or radiculopathy, lumbar region 12/04/2017  . Type 2 diabetes mellitus with hyperglycemia [facility])     Past Surgical History:  Procedure Laterality Date  . CHOLECYSTECTOMY    . DILATATION & CURETTAGE/HYSTEROSCOPY WITH MYOSURE N/A 10/31/2017   Procedure: DILATATION & CURETTAGE/HYSTEROSCOPY WITH MYOSURE;  Surgeon: Salvadore Dom, MD;  Location: [facility];  Service: Gynecology;  Laterality: N/A;  . RIGHT/LEFT HEART CATH AND CORONARY ANGIOGRAPHY N/A 04/11/2016    Procedure: Right/Left Heart Cath and Coronary Angiography;  Surgeon: Burnell Blanks, MD;  Location: [facility];  Service: Cardiovascular;  Laterality: N/A;  . sonogram for blood clots     no blockages    Family Psychiatric History:   Family History:  Family History  Problem Relation Age of Onset  . Depression Mother   . Anxiety disorder Mother   . Diabetes Mother   . Hypertension Mother   . ADD / ADHD Father   . Diabetes Father   . Hypertension Father   . Hypertension Other   . Diabetes Other   . Colitis Other   . Alcohol abuse Other     Social History:  Social History   Socioeconomic History  . Marital status: Married    Spouse name: Not on file  . Number of children: Not on file  . Years of education: Not on file  . Highest education level: Not on file  Occupational History  . Not on file  Tobacco Use  . Smoking status: Former Smoker    Packs/day: 0.50    Types: Cigarettes    Quit date: 02/25/1997    Years since quitting: 23.4  . Smokeless tobacco: Never Used  . Tobacco comment: Married, lives with spouse (when he is not traveling) and son  Vaping Use  . Vaping Use: Never used  Substance and Sexual Activity  . Alcohol use: No  . Drug use: No  . Sexual activity: Yes    Partners: Male    Birth control/protection: None  Other Topics Concern  . Not on file  Social History Narrative  . Not on file   Social Determinants of Health   Financial Resource Strain: Not on file  Food Insecurity: Not on file  Transportation Needs: Not on file  Physical Activity: Not on file  Stress: Not on file  Social Connections: Not on file    Allergies:  Allergies  Allergen Reactions  . Fluoxetine Other (See Comments)    More depressed  . Cymbalta [Duloxetine Hcl] Other (See Comments)    depressed  . Penicillins     REACTION: rash Has patient had a PCN reaction causing immediate rash, facial/tongue/throat swelling, SOB or lightheadedness with hypotension:  YES Has patient had a PCN reaction causing severe rash involving mucus membranes or skin necrosis: NO Has patient had a PCN reaction that required hospitalization: YES Has patient had a PCN reaction occurring within the last 10 years: NO If all of the above answers are "NO", then may proceed with Cephalosporin use.     Metabolic Disorder Labs: Lab Results  Component Value Date   HGBA1C 6.7 (H) 07/25/2020   MPG 139.85 10/29/2017   MPG 125.5 10/19/2016   MPG 125.5 10/19/2016   No results found for: PROLACTIN Lab Results  Component Value Date   CHOL 193 02/10/2020   TRIG 147.0 02/10/2020   HDL 48.90 02/10/2020   CHOLHDL 4 02/10/2020  VLDL 29.4 02/10/2020   LDLCALC 115 (H) 02/10/2020   LDLCALC 128 (H) 07/18/2016   Lab Results  Component Value Date   TSH 4.260 06/15/2020   TSH 3.56 02/10/2020    Therapeutic Level Labs: No results found for: LITHIUM No results found for: VALPROATE No components found for:  CBMZ  Current Medications: Current Outpatient Medications  Medication Sig Dispense Refill  . amLODipine (NORVASC) 5 MG tablet Take 1 tablet by mouth daily.    . ARIPiprazole (ABILIFY) 2 MG tablet TAKE 1 TABLET BY MOUTH EVERY DAY 30 tablet 0  . busPIRone (BUSPAR) 15 MG tablet TAKE 1 TABLET BY MOUTH 2 TIMES DAILY. 60 tablet 0  . carvedilol (COREG) 12.5 MG tablet Take 1 tablet (12.5 mg total) by mouth 2 (two) times daily with a meal. 180 tablet 1  . ciclopirox (LOPROX) 0.77 % cream Apply topically 2 (two) times daily. 90 g 1  . clotrimazole-betamethasone (LOTRISONE) cream APPLY TO AFFECTED AREA TWICE A DAY 45 g 2  . Dapagliflozin-metFORMIN HCl ER (XIGDUO XR) 11-998 MG TB24 Take 1 tablet by mouth daily. 90 tablet 3  . fluticasone (FLONASE) 50 MCG/ACT nasal spray Place 2 sprays into both nostrils daily. 48 g 1  . furosemide (LASIX) 80 MG tablet Take 1 tablet (80 mg total) by mouth 2 (two) times daily. 60 tablet 11  . linaclotide (LINZESS) 145 MCG CAPS capsule Take 1 capsule  (145 mcg total) by mouth daily before breakfast. 90 capsule 1  . LORazepam (ATIVAN) 0.5 MG tablet Take 1 tablet (0.5 mg total) by mouth 2 (two) times daily as needed for anxiety. 14 tablet 1  . potassium chloride SA (KLOR-CON M20) 20 MEQ tablet Take 2 tablets (40 mEq total) by mouth daily. 180 tablet 3  . sacubitril-valsartan (ENTRESTO) 49-51 MG Take 1 tablet by mouth 2 (two) times daily. 60 tablet 2  . sertraline (ZOLOFT) 100 MG tablet Take 1.5 tablets (150 mg total) by mouth daily. 45 tablet 1  . simvastatin (ZOCOR) 20 MG tablet TAKE 1 TABLET BY MOUTH EVERY DAY 90 tablet 2  . traMADol (ULTRAM) 50 MG tablet Take 1 tablet (50 mg total) by mouth every 12 (twelve) hours as needed. 60 tablet 5   No current facility-administered medications for this visit.     Musculoskeletal:   Psychiatric Specialty Exam: Review of Systems dyspnea on exertion  There were no vitals taken for this visit.There is no height or weight on file to calculate BMI.  General Appearance: Well-groomed, good eye contact, no psychomotor agitation/calm, cooperative  Eye Contact: Makes good eye contact  Speech:  Normal Rate  Volume:  Normal  Mood: improving mood, today exhibiting significantly improved mood and range of affect  Affect: Noticeably improved  Thought Process:  Linear and Descriptions of Associations: Intact  Orientation:  Full (Time, Place, and Person)  Thought Content: No hallucinations, no delusions   Suicidal Thoughts:  No - no suicidal plan or intention  Homicidal Thoughts:  No  Memory:  Recent and remote grossly intact  Judgement:  Present   Insight:  Present   Psychomotor Activity:  NA  Concentration:  Concentration: Good and Attention Span: Good  Recall:  Good  Fund of Knowledge: Good  Language: Good  Akathisia:  Negative  Handed:  Right  AIMS (if indicated):   Assets:  Communication Skills Desire for Improvement Resilience  ADL's:  Intact  Cognition: WNL  Sleep:  Good    Screenings: AUDIT   Flowsheet Row Admission (Discharged) from 12/05/2013  in [facility] [facility]  Alcohol Use Disorder Identification Test Final Score (AUDIT) 0    GAD-7   Flowsheet Row Counselor from 05/23/2020 in [facility]  Total GAD-7 Score 10    PHQ2-9   Flowsheet Row Counselor from 05/23/2020 in [facility] [place] [facility] Visit from 02/10/2020 in [place] at [facility] Visit from 11/07/2017 in [place] Visit from 10/18/2016 in [place] from 06/26/2016 in [place]  PHQ-2 Total Score 6 0 2 5 4   PHQ-9 Total Score 23 0 11 8 21     Flowsheet Row Counselor from 05/23/2020 in [facility] No Risk       Assessment and Plan:   Today presents with husband.  She spoke at length about the negative impact that her husband's unannounced trips overseas have had on her mood and level of functioning .  Acknowledges that her mood improves when he is home.  She does state it has become more difficult for her to trust him based on his history of past unannounced trips out of the country.  He does have a son in [place] whom he states he is trying to bring to the [place], which he states is the reason for his trips.  He states he has no further plans to travel and that if he does he will take patient with him.  No medication side effects reported.   Plan  Continue Zoloft 150 mgr QDAY for depression and anxiety   Continue  Ativan 0.5 mgr BID PRN for severe anxiety Continue  Abilify 2 mgr QDAY for augmentation Continue Buspar 15 mgr BID for anxiety    Continue individual therapy and have recommended they consider couples/family therapy.  Will see again in 4 weeks, agrees to contact clinic sooner if any worsening prior to medication  concerns     Jenne Campus, MD 07/26/2020, 1:15 PM

## 2020-07-31 ENCOUNTER — Telehealth: Payer: Self-pay | Admitting: Internal Medicine

## 2020-07-31 ENCOUNTER — Ambulatory Visit
Admission: RE | Admit: 2020-07-31 | Discharge: 2020-07-31 | Disposition: A | Discharge status: Home / Self Care | Payer: Medicare Other | Source: Ambulatory Visit | Attending: Neurological Surgery | Admitting: Neurological Surgery

## 2020-07-31 ENCOUNTER — Ambulatory Visit (INDEPENDENT_AMBULATORY_CARE_PROVIDER_SITE_OTHER): Payer: Medicare Other | Admitting: Licensed Clinical Social Worker

## 2020-07-31 ENCOUNTER — Other Ambulatory Visit: Payer: Self-pay

## 2020-07-31 DIAGNOSIS — M48061 Spinal stenosis, lumbar region without neurogenic claudication: Secondary | ICD-10-CM | POA: Diagnosis not present

## 2020-07-31 DIAGNOSIS — M545 Low back pain, unspecified: Secondary | ICD-10-CM | POA: Diagnosis not present

## 2020-07-31 DIAGNOSIS — F988 Other specified behavioral and emotional disorders with onset usually occurring in childhood and adolescence: Secondary | ICD-10-CM | POA: Diagnosis not present

## 2020-07-31 DIAGNOSIS — F332 Major depressive disorder, recurrent severe without psychotic features: Secondary | ICD-10-CM | POA: Diagnosis not present

## 2020-07-31 DIAGNOSIS — M5416 Radiculopathy, lumbar region: Secondary | ICD-10-CM

## 2020-07-31 DIAGNOSIS — M4802 Spinal stenosis, cervical region: Secondary | ICD-10-CM | POA: Diagnosis not present

## 2020-07-31 DIAGNOSIS — M542 Cervicalgia: Secondary | ICD-10-CM

## 2020-07-31 NOTE — Telephone Encounter (Signed)
Patient called and said that her pharmacy told her that they are needing to speak with Dr. Ronnald Ramp in regards to traMADol (ULTRAM) 50 MG tablet. She said that they are needing approval. Please advise

## 2020-07-31 NOTE — Telephone Encounter (Signed)
Spoke to the pharmacist. She stated that is was no issue with the Rx. She could have it ready for pick up today.

## 2020-07-31 NOTE — Telephone Encounter (Signed)
Patient called and said that she had her MRI done today. She was wondering if Dr. Ronnald Ramp had received the results and was wondering if she needed to see the neurosurgeon again even though she is not going to have surgery. She said that she is scheduled with the neurosurgeon for 6/14. Please advise

## 2020-07-31 NOTE — Progress Notes (Signed)
Virtual Visit via Telephone Note   I connected with [name] on 07/31/20 at 10:00am by telephone and verified that I am speaking with the correct person using two identifiers.   I discussed the limitations, risks, security and privacy concerns of performing an evaluation and management service by telephone and the availability of in person appointments. I also discussed with the patient that there may be a patient responsible charge related to this service. The patient expressed understanding and agreed to proceed.   I discussed the assessment and treatment plan with the patient. The patient was provided an opportunity to ask questions and all were answered. The patient agreed with the plan and demonstrated an understanding of the instructions.   The patient was advised to call back or seek an in-person evaluation if the symptoms worsen or if the condition fails to improve as anticipated.   I provided 1 hour of non-face-to-face time during this encounter.     Shade Flood, LCSW, LCAS ________________________________ THERAPIST PROGRESS NOTE   Session Time: 10:00am - 11:00am  Location: Patient: Patient Home Provider: [facility] [place] [facility]    Participation Level: Active   Behavioral Response: Alert, anxious mood   Type of Therapy:  Individual Therapy   Treatment Goals addressed: Depression, panic attack, and anxiety management; Medication Compliance; Monitoring substance use   Interventions: CBT: increasing motivation   Summary: [name] is a 55 year old married Caucasian female on disability that presented today for therapy appointment with diagnoses of Major depressive disorder, recurrent, severe with anxious distress; and ADD.         Suicidal/Homicidal: None; without plan or intent.    Therapist Response: Clinician spoke with [name] for telephone session today due to her inability to travel or access video meetings.  Clinician assessed for safety, sobriety, and medication compliance.   [name] spoke in a manner that was alert, oriented x5, with no evidence or self-report of active SI/HI or A/V H.  [name] reported ongoing compliance with medication and denied using alcohol or illicit substances since previous session.  Clinician inquired about [name]'s current emotional ratings, as well as any significant changes in thoughts, feelings, or behavior since previous session. [name] reported scores of 5/10 for depression, 8/10 for anxiety, and 0/10 for anger/irritability.  [name] was unsure about whether she has had reoccurrence of panic attacks over past week. [name] reported that she has been struggling with motivation to accomplish tasks over recent weeks since cutting back on marijuana use, stating "I've done without it for years and years and years, and didn't remember to what extent it helped me, now I got a taste of it, and feel like it should be an option".  Clinician utilized a handout with [employer] today to help improve her motivation without need for substance use via categories of techniques, including ones to change her thinking (I.e. identifying roadblocks/barriers, developing a mantra, noting previous successes), taking steps to make things easier (I.e. planning a time to get started on something, planning time for breaks, minimizing distractions), and creating rewards/generating accountability (I.e. finding a partner with similar goals, establishing realistic rewards, and measuring progress).  Intervention was effective, as evidenced by [name] following along with handout, engaging actively in discussion on techniques and expressing receptiveness to several, stating "I do need to work on some of these things.  I guess I need to start writing things down again because that used to give me a sense of accomplishment".  She reported that her mantra would be "Just do  it", and some goals this week would include doing something with her son, making phone calls to people she hasn't connected with in  awhile, take photos of items to sell online, and follow up with medical providers.  Clinician emailed [name] a copy of this handout to review over following days, and will continue to monitor      Plan: Follow up again in 1 week.   Diagnosis: Major depressive disorder, recurrent, severe with anxious distress; and ADD.   Shade Flood, LCSW, LCAS 07/31/20

## 2020-08-01 ENCOUNTER — Encounter: Payer: Self-pay | Admitting: Internal Medicine

## 2020-08-03 ENCOUNTER — Ambulatory Visit (INDEPENDENT_AMBULATORY_CARE_PROVIDER_SITE_OTHER): Payer: Medicare Other

## 2020-08-03 ENCOUNTER — Other Ambulatory Visit: Payer: Self-pay

## 2020-08-03 VITALS — BP 118/70 | HR 74 | Temp 97.9°F | Ht 64.0 in | Wt 338.4 lb

## 2020-08-03 DIAGNOSIS — E1165 Type 2 diabetes mellitus with hyperglycemia: Secondary | ICD-10-CM

## 2020-08-03 DIAGNOSIS — Z1231 Encounter for screening mammogram for malignant neoplasm of breast: Secondary | ICD-10-CM | POA: Diagnosis not present

## 2020-08-03 DIAGNOSIS — Z0000 Encounter for general adult medical examination without abnormal findings: Secondary | ICD-10-CM

## 2020-08-03 NOTE — Patient Instructions (Signed)
Ms. Dehne , Thank you for taking time to come for your Medicare Wellness Visit. I appreciate your ongoing commitment to your health goals. Please review the following plan we discussed and let me know if I can assist you in the future.   Screening recommendations/referrals: Colonoscopy: 02/12/2016; due every 10 years Mammogram: 10/20/2017; due very 1-2 years Bone Density: never done Recommended yearly ophthalmology/optometry visit for glaucoma screening and checkup Recommended yearly dental visit for hygiene and checkup  Vaccinations: Influenza vaccine: 02/10/2020 Pneumococcal vaccine: 02/10/2020 Tdap vaccine: 11/07/2017 Shingles vaccine: never done  Covid-19: 09/24/2019, 11/25/2019, 07/11/2020  Advanced directives: .sh  Conditions/risks identified: Yes; Reviewed health maintenance screenings with patient today and relevant education, vaccines, and/or referrals were provided. Please continue to do your personal lifestyle choices by: daily care of teeth and gums, regular physical activity (goal should be 5 days a week for 30 minutes), eat a healthy diet, avoid tobacco and drug use, limiting any alcohol intake, taking a low-dose aspirin (if not allergic or have been advised by your provider otherwise) and taking vitamins and minerals as recommended by your provider. Continue doing brain stimulating activities (puzzles, reading, adult coloring books, staying active) to keep memory sharp. Continue to eat heart healthy diet (full of fruits, vegetables, whole grains, lean protein, water--limit salt, fat, and sugar intake) and increase physical activity as tolerated.  Next appointment: Please schedule your next Medicare Wellness Visit with your Nurse Health Advisor in 1 year by calling [phone].  Preventive Care 40-64 Years, Female Preventive care refers to lifestyle choices and visits with your health care provider that can promote health and wellness. What does preventive care include? A yearly  physical exam. This is also called an annual well check. Dental exams once or twice a year. Routine eye exams. Ask your health care provider how often you should have your eyes checked. Personal lifestyle choices, including: Daily care of your teeth and gums. Regular physical activity. Eating a healthy diet. Avoiding tobacco and drug use. Limiting alcohol use. Practicing safe sex. Taking low-dose aspirin daily starting at age 21. Taking vitamin and mineral supplements as recommended by your health care provider. What happens during an annual well check? The services and screenings done by your health care provider during your annual well check will depend on your age, overall health, lifestyle risk factors, and family history of disease. Counseling  Your health care provider may ask you questions about your: Alcohol use. Tobacco use. Drug use. Emotional well-being. Home and relationship well-being. Sexual activity. Eating habits. Work and work Statistician. Method of birth control. Menstrual cycle. Pregnancy history. Screening  You may have the following tests or measurements: Height, weight, and BMI. Blood pressure. Lipid and cholesterol levels. These may be checked every 5 years, or more frequently if you are over 78 years old. Skin check. Lung cancer screening. You may have this screening every year starting at age 53 if you have a 30-pack-year history of smoking and currently smoke or have quit within the past 15 years. Fecal occult blood test (FOBT) of the stool. You may have this test every year starting at age 82. Flexible sigmoidoscopy or colonoscopy. You may have a sigmoidoscopy every 5 years or a colonoscopy every 10 years starting at age 33. Hepatitis C blood test. Hepatitis B blood test. Sexually transmitted disease (STD) testing. Diabetes screening. This is done by checking your blood sugar (glucose) after you have not eaten for a while (fasting). You may have this  done every 1-3 years. Mammogram.  This may be done every 1-2 years. Talk to your health care provider about when you should start having regular mammograms. This may depend on whether you have a family history of breast cancer. BRCA-related cancer screening. This may be done if you have a family history of breast, ovarian, tubal, or peritoneal cancers. Pelvic exam and Pap test. This may be done every 3 years starting at age 68. Starting at age 69, this may be done every 5 years if you have a Pap test in combination with an HPV test. Bone density scan. This is done to screen for osteoporosis. You may have this scan if you are at high risk for osteoporosis. Discuss your test results, treatment options, and if necessary, the need for more tests with your health care provider. Vaccines  Your health care provider may recommend certain vaccines, such as: Influenza vaccine. This is recommended every year. Tetanus, diphtheria, and acellular pertussis (Tdap, Td) vaccine. You may need a Td booster every 10 years. Zoster vaccine. You may need this after age 43. Pneumococcal 13-valent conjugate (PCV13) vaccine. You may need this if you have certain conditions and were not previously vaccinated. Pneumococcal polysaccharide (PPSV23) vaccine. You may need one or two doses if you smoke cigarettes or if you have certain conditions. Talk to your health care provider about which screenings and vaccines you need and how often you need them. This information is not intended to replace advice given to you by your health care provider. Make sure you discuss any questions you have with your health care provider. Document Released: 03/10/2015 Document Revised: 11/01/2015 Document Reviewed: 12/13/2014 Elsevier Interactive Patient Education  2017 [place] Prevention in the Home Falls can cause injuries. They can happen to people of all ages. There are many things you can do to make your home safe and to help  prevent falls. What can I do on the outside of my home? Regularly fix the edges of walkways and driveways and fix any cracks. Remove anything that might make you trip as you walk through a door, such as a raised step or threshold. Trim any bushes or trees on the path to your home. Use bright outdoor lighting. Clear any walking paths of anything that might make someone trip, such as rocks or tools. Regularly check to see if handrails are loose or broken. Make sure that both sides of any steps have handrails. Any raised decks and porches should have guardrails on the edges. Have any leaves, snow, or ice cleared regularly. Use sand or salt on walking paths during winter. Clean up any spills in your garage right away. This includes oil or grease spills. What can I do in the bathroom? Use night lights. Install grab bars by the toilet and in the tub and shower. Do not use towel bars as grab bars. Use non-skid mats or decals in the tub or shower. If you need to sit down in the shower, use a plastic, non-slip stool. Keep the floor dry. Clean up any water that spills on the floor as soon as it happens. Remove soap buildup in the tub or shower regularly. Attach bath mats securely with double-sided non-slip rug tape. Do not have throw rugs and other things on the floor that can make you trip. What can I do in the bedroom? Use night lights. Make sure that you have a light by your bed that is easy to reach. Do not use any sheets or blankets that are too  big for your bed. They should not hang down onto the floor. Have a firm chair that has side arms. You can use this for support while you get dressed. Do not have throw rugs and other things on the floor that can make you trip. What can I do in the kitchen? Clean up any spills right away. Avoid walking on wet floors. Keep items that you use a lot in easy-to-reach places. If you need to reach something above you, use a strong step stool that has a  grab bar. Keep electrical cords out of the way. Do not use floor polish or wax that makes floors slippery. If you must use wax, use non-skid floor wax. Do not have throw rugs and other things on the floor that can make you trip. What can I do with my stairs? Do not leave any items on the stairs. Make sure that there are handrails on both sides of the stairs and use them. Fix handrails that are broken or loose. Make sure that handrails are as long as the stairways. Check any carpeting to make sure that it is firmly attached to the stairs. Fix any carpet that is loose or worn. Avoid having throw rugs at the top or bottom of the stairs. If you do have throw rugs, attach them to the floor with carpet tape. Make sure that you have a light switch at the top of the stairs and the bottom of the stairs. If you do not have them, ask someone to add them for you. What else can I do to help prevent falls? Wear shoes that: Do not have high heels. Have rubber bottoms. Are comfortable and fit you well. Are closed at the toe. Do not wear sandals. If you use a stepladder: Make sure that it is fully opened. Do not climb a closed stepladder. Make sure that both sides of the stepladder are locked into place. Ask someone to hold it for you, if possible. Clearly mark and make sure that you can see: Any grab bars or handrails. First and last steps. Where the edge of each step is. Use tools that help you move around (mobility aids) if they are needed. These include: Canes. Walkers. Scooters. Crutches. Turn on the lights when you go into a dark area. Replace any light bulbs as soon as they burn out. Set up your furniture so you have a clear path. Avoid moving your furniture around. If any of your floors are uneven, fix them. If there are any pets around you, be aware of where they are. Review your medicines with your doctor. Some medicines can make you feel dizzy. This can increase your chance of  falling. Ask your doctor what other things that you can do to help prevent falls. This information is not intended to replace advice given to you by your health care provider. Make sure you discuss any questions you have with your health care provider. Document Released: 12/08/2008 Document Revised: 07/20/2015 Document Reviewed: 03/18/2014 Elsevier Interactive Patient Education  2017 [employer].

## 2020-08-03 NOTE — Progress Notes (Addendum)
Subjective:   [name] is a 55 y.o. female who presents for Medicare Annual (Subsequent) preventive examination.  Review of Systems     Cardiac Risk Factors include: advanced age (>50mn, >>56women);diabetes mellitus;dyslipidemia;hypertension;family history of premature cardiovascular disease;obesity (BMI >30kg/m2)     Objective:    Today's Vitals   08/03/20 1229  BP: 118/70  Pulse: 74  Temp: 97.9 F (36.6 C)  SpO2: 94%  Weight: (!) 338 lb 6.4 oz (153.5 kg)  Height: 5' 4"  (1.626 m)   Body mass index is 58.09 kg/m.  Advanced Directives 08/03/2020 10/29/2017 06/26/2016 04/11/2016 04/04/2016 07/03/2015 05/05/2015  Does Patient Have a Medical Advance Directive? No No No No No No No  Would patient like information on creating a medical advance directive? No - Patient declined Yes (MAU/Ambulatory/Procedural Areas - Information given) Yes (MAU/Ambulatory/Procedural Areas - Information given) No - Patient declined - - Yes - Educational materials given  Some encounter information is confidential and restricted. Go to Review Flowsheets activity to see all data.    Current Medications (verified) Outpatient Encounter Medications as of 08/03/2020  Medication Sig   amLODipine (NORVASC) 5 MG tablet Take 1 tablet by mouth daily.   ARIPiprazole (ABILIFY) 2 MG tablet Take 1 tablet (2 mg total) by mouth daily.   busPIRone (BUSPAR) 15 MG tablet Take 1 tablet (15 mg total) by mouth 2 (two) times daily.   carvedilol (COREG) 12.5 MG tablet Take 1 tablet (12.5 mg total) by mouth 2 (two) times daily with a meal.   ciclopirox (LOPROX) 0.77 % cream Apply topically 2 (two) times daily.   clotrimazole-betamethasone (LOTRISONE) cream APPLY TO AFFECTED AREA TWICE A DAY   Dapagliflozin-metFORMIN HCl ER (XIGDUO XR) 11-998 MG TB24 Take 1 tablet by mouth daily.   fluticasone (FLONASE) 50 MCG/ACT nasal spray Place 2 sprays into both nostrils daily.   furosemide (LASIX) 80 MG tablet Take 1 tablet (80 mg total) by  mouth 2 (two) times daily.   linaclotide (LINZESS) 145 MCG CAPS capsule Take 1 capsule (145 mcg total) by mouth daily before breakfast.   LORazepam (ATIVAN) 0.5 MG tablet Take 1 tablet (0.5 mg total) by mouth 2 (two) times daily as needed for anxiety.   potassium chloride SA (KLOR-CON M20) 20 MEQ tablet Take 2 tablets (40 mEq total) by mouth daily.   sacubitril-valsartan (ENTRESTO) 49-51 MG Take 1 tablet by mouth 2 (two) times daily.   sertraline (ZOLOFT) 100 MG tablet Take 1.5 tablets (150 mg total) by mouth daily.   simvastatin (ZOCOR) 20 MG tablet TAKE 1 TABLET BY MOUTH EVERY DAY   traMADol (ULTRAM) 50 MG tablet Take 1 tablet (50 mg total) by mouth every 12 (twelve) hours as needed.   No facility-administered encounter medications on file as of 08/03/2020.    Allergies (verified) Fluoxetine, Cymbalta [duloxetine hcl], and Penicillins   History: Past Medical History:  Diagnosis Date   ADD (attention deficit disorder)    Anxiety    Arthritis    both knees right worse than left   Carpal tunnel syndrome of right wrist    Depression    Essential hypertension, benign    Gallstones    History of smoking 06/22/2016   Hyperlipidemia associated with type 2 diabetes mellitus ([facility], on Zocor 04/04/2011   Hypertension associated with diabetes ([facility] 06/03/2008   Migraines    Morbid obesity ([facility]    Morbid obesity with BMI of 50.0-59.9, adult ([facility] 07/12/2006   NICM (nonischemic cardiomyopathy) ([facility] 07/12/2006   01/16/18 ECHO:    -  Procedure narrative: Transthoracic echocardiography. Image   quality was suboptimal. The study was technically difficult.   Intravenous contrast (Definity) was administered. - Left ventricle: The cavity size was moderately dilated. Wall   thickness was increased in a pattern of mild LVH. Systolic   function was moderately to severely reduced. The estimated   ejection fraction w   Normal coronary arteries 04/17/2016   OSA on CPAP 10/28/2014   Spinal stenosis    back pain    Spondylosis without myelopathy or radiculopathy, lumbar region 12/04/2017   Type 2 diabetes mellitus with hyperglycemia [facility])    Past Surgical History:  Procedure Laterality Date   CHOLECYSTECTOMY     DILATATION & CURETTAGE/HYSTEROSCOPY WITH MYOSURE N/A 10/31/2017   Procedure: DILATATION & CURETTAGE/HYSTEROSCOPY WITH MYOSURE;  Surgeon: Salvadore Dom, MD;  Location: [facility];  Service: Gynecology;  Laterality: N/A;   RIGHT/LEFT HEART CATH AND CORONARY ANGIOGRAPHY N/A 04/11/2016   Procedure: Right/Left Heart Cath and Coronary Angiography;  Surgeon: Burnell Blanks, MD;  Location: [facility];  Service: Cardiovascular;  Laterality: N/A;   sonogram for blood clots     no blockages   Family History  Problem Relation Age of Onset   Depression Mother    Anxiety disorder Mother    Diabetes Mother    Hypertension Mother    ADD / ADHD Father    Diabetes Father    Hypertension Father    Hypertension Other    Diabetes Other    Colitis Other    Alcohol abuse Other    Social History   Socioeconomic History   Marital status: Married    Spouse name: Not on file   Number of children: Not on file   Years of education: Not on file   Highest education level: Not on file  Occupational History   Not on file  Tobacco Use   Smoking status: Former    Packs/day: 0.50    Pack years: 0.00    Types: Cigarettes    Quit date: 02/25/1997    Years since quitting: 23.4   Smokeless tobacco: Never   Tobacco comments:    Married, lives with spouse (when he is not traveling) and son  Vaping Use   Vaping Use: Never used  Substance and Sexual Activity   Alcohol use: No   Drug use: No   Sexual activity: Yes    Partners: Male    Birth control/protection: None  Other Topics Concern   Not on file  Social History Narrative   Not on file   Social Determinants of Health   Financial Resource Strain: Medium Risk   Difficulty of Paying Living Expenses: Somewhat hard  Food Insecurity: No  Food Insecurity   Worried About Charity fundraiser in the Last Year: Never true   [place] in the Last Year: Never true  Transportation Needs: No Transportation Needs   Lack of Transportation (Medical): No   Lack of Transportation (Non-Medical): No  Physical Activity: Inactive   Days of Exercise per Week: 0 days   Minutes of Exercise per Session: 0 min  Stress: No Stress Concern Present   Feeling of Stress : Not at all  Social Connections: Moderately Isolated   Frequency of Communication with Friends and Family: More than three times a week   Frequency of Social Gatherings with Friends and Family: Once a week   Attends Religious Services: Never   Marine scientist or Organizations: No   Attends  Archivist Meetings: Never   Marital Status: Married    Tobacco Counseling Counseling given: Not Answered Tobacco comments: Married, lives with spouse (when he is not traveling) and son   Clinical Intake:  Pre-visit preparation completed: Yes  Pain : No/denies pain     BMI - recorded: 58.09 Nutritional Status: BMI > 30  Obese Nutritional Risks: None Diabetes: Yes CBG done?: No Did pt. bring in CBG monitor from home?: No  How often do you need to have someone help you when you read instructions, pamphlets, or other written materials from your doctor or pharmacy?: 1 - Never What is the last grade level you completed in school?: Associates Degree  Diabetic? yes  Interpreter Needed?: No  Information entered by :: Lisette Abu, LPN   Activities of Daily Living In your present state of health, do you have any difficulty performing the following activities: 08/03/2020  Hearing? Y  Vision? N  Difficulty concentrating or making decisions? Y  Walking or climbing stairs? Y  Dressing or bathing? Y  Doing errands, shopping? N  Preparing Food and eating ? N  Using the Toilet? N  In the past six months, have you accidently leaked urine? Y  Do you have  problems with loss of bowel control? N  Managing your Medications? N  Managing your Finances? N  Housekeeping or managing your Housekeeping? Y  Some recent data might be hidden    Patient Care Team: [name], MD as PCP - General (Internal Medicine) Salvadore Dom, MD as PCP - OBGYN (Obstetrics and Gynecology) Minus Breeding, MD as PCP - Cardiology (Cardiology) Minus Breeding, MD as Consulting Physician (Cardiology)  Indicate any recent Medical Services you may have received from other than [facility] providers in the past year (date may be approximate).     Assessment:   This is a routine wellness examination for [name].  Hearing/Vision screen No results found.  Dietary issues and exercise activities discussed: Current Exercise Habits: The patient does not participate in regular exercise at present, Exercise limited by: orthopedic condition(s);psychological condition(s);respiratory conditions(s);cardiac condition(s)   Goals Addressed   None   Depression Screen PHQ 2/9 Scores 08/03/2020 02/10/2020 11/07/2017 10/18/2016 06/26/2016 07/19/2015 07/05/2015  PHQ - 2 Score 2 0 2 5 4 2  0  PHQ- 9 Score - 0 11 8 21 5  -  Exception Documentation - - - - - Other- indicate reason in comment box -  Not completed - - - - - Patient treated for depression and anxiety, Family HX -  Some encounter information is confidential and restricted. Go to Review Flowsheets activity to see all data.    Fall Risk Fall Risk  08/03/2020 10/18/2016 07/19/2015 07/05/2015  Falls in the past year? 0 No No Yes  Number falls in past yr: 0 - - 2 or more  Injury with Fall? 0 - - No  Risk for fall due to : Impaired balance/gait - - -  Follow up Falls evaluation completed - - -    FALL RISK PREVENTION PERTAINING TO THE HOME:  Any stairs in or around the home? Yes  If so, are there any without handrails? No  Home free of loose throw rugs in walkways, pet beds, electrical cords, etc? Yes  Adequate lighting in your  home to reduce risk of falls? Yes   ASSISTIVE DEVICES UTILIZED TO PREVENT FALLS:  Life alert? No  Use of a cane, walker or w/c? No  Grab bars in the bathroom? No  Shower  chair or bench in shower? No  Elevated toilet seat or a handicapped toilet? No   TIMED UP AND GO:  Was the test performed? No .  Length of time to ambulate 10 feet: 0 sec.   Gait steady and fast without use of assistive device  Cognitive Function:        Immunizations Immunization History  Administered Date(s) Administered   DTaP 11/02/1965, 04/28/1967, 09/05/1970, 10/09/1972   IPV 11/02/1965, 11/25/1966, 08/05/1969, 08/26/1969   Influenza Split 01/15/2011, 11/18/2011   Influenza, High Dose Seasonal PF 01/15/2011, 11/18/2011, 11/27/2013, 03/30/2015   Influenza,inj,Quad PF,6+ Mos 11/27/2013, 03/30/2015, 12/29/2015, 01/13/2017, 11/04/2017, 01/08/2019, 02/10/2020   Influenza-Unspecified 01/09/2005, 11/27/2013, 03/30/2015   MMR 09/29/1997   Measles 09/12/1966   Mumps 11/01/1977   PFIZER(Purple Top)SARS-COV-2 Vaccination 09/24/2019, 10/29/2019, 07/11/2020   Pneumococcal Polysaccharide-23 01/09/2005, 01/15/2011, 02/10/2020   Tdap 11/07/2017    TDAP status: Up to date  Flu Vaccine status: Up to date  Pneumococcal vaccine status: Up to date  Covid-19 vaccine status: Completed vaccines  Qualifies for Shingles Vaccine? Yes   Zostavax completed No   Shingrix Completed?: No.    Education has been provided regarding the importance of this vaccine. Patient has been advised to call insurance company to determine out of pocket expense if they have not yet received this vaccine. Advised may also receive vaccine at local pharmacy or Health Dept. Verbalized acceptance and understanding.  Screening Tests Health Maintenance  Topic Date Due   Pneumococcal Vaccine 79-56 Years old (1 - PCV) Never done   OPHTHALMOLOGY EXAM  Never done   Zoster Vaccines- Shingrix (1 of 2) Never done   MAMMOGRAM  10/11/2019   PAP  SMEAR-Modifier  09/09/2020   INFLUENZA VACCINE  09/25/2020   COVID-19 Vaccine (4 - Booster for Pfizer series) 10/11/2020   HEMOGLOBIN A1C  01/24/2021   FOOT EXAM  02/09/2021   COLONOSCOPY (Pts 45-68yr Insurance coverage will need to be confirmed)  02/11/2026   TETANUS/TDAP  11/08/2027   PNEUMOCOCCAL POLYSACCHARIDE VACCINE AGE 79-64 HIGH RISK  Completed   Hepatitis C Screening  Completed   HIV Screening  Completed   HPV VACCINES  Aged Out    Health Maintenance  Health Maintenance Due  Topic Date Due   Pneumococcal Vaccine 065639Years old (1 - PCV) Never done   OPHTHALMOLOGY EXAM  Never done   Zoster Vaccines- Shingrix (1 of 2) Never done   MAMMOGRAM  10/11/2019   PAP SMEAR-Modifier  09/09/2020    Colorectal cancer screening: Type of screening: Colonoscopy. Completed 02/12/2016. Repeat every 10 years  Mammogram status: Ordered 08/03/2020. Pt provided with contact info and advised to call to schedule appt.   Lung Cancer Screening: (Low Dose CT Chest recommended if Age 414-80years, 30 pack-year currently smoking OR have quit w/in 15years.) does qualify.   Lung Cancer Screening Referral: no  Additional Screening:  Hepatitis C Screening: does qualify; Completed yes  Vision Screening: Recommended annual ophthalmology exams for early detection of glaucoma and other disorders of the eye. Is the patient up to date with their annual eye exam?  No  Who is the provider or what is the name of the office in which the patient attends annual eye exams? Looking for a provider If pt is not established with a provider, would they like to be referred to a provider to establish care? Yes .   Dental Screening: Recommended annual dental exams for proper oral hygiene  Community Resource Referral / Chronic Care Management: CRR required this visit?  No   CCM required this visit?  No      Plan:     I have personally reviewed and noted the following in the patient's chart:   Medical and social  history Use of alcohol, tobacco or illicit drugs  Current medications and supplements including opioid prescriptions.  Functional ability and status Nutritional status Physical activity Advanced directives List of other physicians Hospitalizations, surgeries, and ER visits in previous 12 months Vitals Screenings to include cognitive, depression, and falls Referrals and appointments  In addition, I have reviewed and discussed with patient certain preventive protocols, quality metrics, and best practice recommendations. A written personalized care plan for preventive services as well as general preventive health recommendations were provided to patient.     Sheral Flow, LPN   03/02/8307   Nurse Notes: n/a

## 2020-08-07 ENCOUNTER — Ambulatory Visit (HOSPITAL_COMMUNITY): Payer: Medicare Other | Admitting: Psychiatry

## 2020-08-08 ENCOUNTER — Encounter: Payer: Self-pay | Admitting: Internal Medicine

## 2020-08-08 DIAGNOSIS — M5136 Other intervertebral disc degeneration, lumbar region: Secondary | ICD-10-CM | POA: Insufficient documentation

## 2020-08-08 DIAGNOSIS — M542 Cervicalgia: Secondary | ICD-10-CM | POA: Diagnosis not present

## 2020-08-08 DIAGNOSIS — M5416 Radiculopathy, lumbar region: Secondary | ICD-10-CM | POA: Diagnosis not present

## 2020-08-08 DIAGNOSIS — I10 Essential (primary) hypertension: Secondary | ICD-10-CM | POA: Diagnosis not present

## 2020-08-08 DIAGNOSIS — Z6841 Body Mass Index (BMI) 40.0 and over, adult: Secondary | ICD-10-CM | POA: Diagnosis not present

## 2020-08-14 ENCOUNTER — Ambulatory Visit (INDEPENDENT_AMBULATORY_CARE_PROVIDER_SITE_OTHER): Payer: Medicare Other | Admitting: Licensed Clinical Social Worker

## 2020-08-14 ENCOUNTER — Other Ambulatory Visit: Payer: Self-pay

## 2020-08-14 DIAGNOSIS — F988 Other specified behavioral and emotional disorders with onset usually occurring in childhood and adolescence: Secondary | ICD-10-CM

## 2020-08-14 DIAGNOSIS — F332 Major depressive disorder, recurrent severe without psychotic features: Secondary | ICD-10-CM

## 2020-08-14 NOTE — Progress Notes (Signed)
Virtual Visit via Telephone Note   I connected with [name] on 08/14/20 at 10:12am by telephone and verified that I am speaking with the correct person using two identifiers.   I discussed the limitations, risks, security and privacy concerns of performing an evaluation and management service by telephone and the availability of in person appointments. I also discussed with the patient that there may be a patient responsible charge related to this service. The patient expressed understanding and agreed to proceed.   I discussed the assessment and treatment plan with the patient. The patient was provided an opportunity to ask questions and all were answered. The patient agreed with the plan and demonstrated an understanding of the instructions.   The patient was advised to call back or seek an in-person evaluation if the symptoms worsen or if the condition fails to improve as anticipated.   I provided 48 minutes of non-face-to-face time during this encounter.     Shade Flood, LCSW, LCAS ________________________________ THERAPIST PROGRESS NOTE   Session Time: 10:12am - 11:00am  Location: Patient: Patient Home Provider: [facility]    Participation Level: Active   Behavioral Response: Alert, anxious mood   Type of Therapy:  Individual Therapy   Treatment Goals addressed: Depression, panic attack, and anxiety management; Medication Compliance; Monitoring substance use   Interventions: CBT, managing boundaries    Summary: [name] is a 55 year old married Caucasian female on disability that presented today for therapy appointment with diagnoses of Major depressive disorder, recurrent, severe with anxious distress; and ADD.         Suicidal/Homicidal: None; without plan or intent.    Therapist Response: Clinician spoke with [name] for telephone appointment today due to her inability to travel or access video meetings.  Clinician could not reach [name] until third telephone  outreach at 10:12am, and she reported that due to trouble with her phone, she has not been receiving calls.  Clinician assessed for safety, sobriety, and medication compliance.  [name] spoke in a manner that was alert, oriented x5, with no evidence or self-report of active SI/HI or A/V H.  [name] reported that she continues taking medication as prescribed and denied any use of alcohol or illicit substances.  Clinician inquired about [name]'s emotional ratings today, as well as any significant changes in thoughts, feelings, or behavior since last session. [name] reported scores of 7/10 for depression, 9/10 for anxiety, and 0/10 for anger/irritability.  [name] reported that she could not remember whether she has had any panic attacks over past 2 weeks.  Clinician recommended that [name] keep a journal to document panic episodes and gain insight into any triggers influencing them, which she was agreeable to.  [name] reported that despite increase in depression and anxiety, she was able to build motivation to go out with family to a [name] art exhibit in [place], as well as [employer] outlets to go shopping another day.  She stated "It was a lot of fun and I'm glad we did it".  Clinician recommended that [name] continue to make time for positive self-care activities during her routine, especially those that include close supports.  [name] was agreeable to this suggestion as well, noting that she has been more mindful recently about who she keeps company with, as she recently encountered someone she has spoken with in some time, and their interactions have been stressful for her.  Clinician utilized a handout on boundaries with [employer] to guide discussion on this subject.  This included  defining what boundaries are (i.e. the limits and rules we set for ourselves within relationships), ways to articulate boundaries to others in a respectful, but assertive way, and tips for improving approach, such as planning ahead regarding what to  say and how to say it, seeking compromise when appropriate, and using confident body language.  Intervention was effective, as evidenced by [name] reporting increased confidence in ability to express her personal needs and make changes to support network when necessary in order to prioritize her mental health.  Clinician will continue to monitor      Plan: Follow up again in 1 week.   Diagnosis: Major depressive disorder, recurrent, severe with anxious distress; and ADD.   Shade Flood, LCSW, LCAS 08/14/20

## 2020-08-21 ENCOUNTER — Ambulatory Visit (INDEPENDENT_AMBULATORY_CARE_PROVIDER_SITE_OTHER): Payer: Medicare Other | Admitting: Licensed Clinical Social Worker

## 2020-08-21 ENCOUNTER — Other Ambulatory Visit: Payer: Self-pay

## 2020-08-21 DIAGNOSIS — F332 Major depressive disorder, recurrent severe without psychotic features: Secondary | ICD-10-CM | POA: Diagnosis not present

## 2020-08-21 DIAGNOSIS — F988 Other specified behavioral and emotional disorders with onset usually occurring in childhood and adolescence: Secondary | ICD-10-CM

## 2020-08-21 NOTE — Progress Notes (Signed)
Virtual Visit via Telephone Note   I connected with Aneyah Lortz on 08/21/20 at 10:00am by telephone and verified that I am speaking with the correct person using two identifiers.   I discussed the limitations, risks, security and privacy concerns of performing an evaluation and management service by telephone and the availability of in person appointments. I also discussed with the patient that there may be a patient responsible charge related to this service. The patient expressed understanding and agreed to proceed.   I discussed the assessment and treatment plan with the patient. The patient was provided an opportunity to ask questions and all were answered. The patient agreed with the plan and demonstrated an understanding of the instructions.   The patient was advised to call back or seek an in-person evaluation if the symptoms worsen or if the condition fails to improve as anticipated.   I provided 1 hour of non-face-to-face time during this encounter.     Shade Flood, LCSW, LCAS ________________________________ THERAPIST PROGRESS NOTE   Session Time: 10:00am - 11:00am   Location: Patient: Patient Home Provider: [facility]    Participation Level: Active   Behavioral Response: Alert, anxious mood   Type of Therapy:  Individual Therapy   Treatment Goals addressed: Depression, panic attack, and anxiety management; Medication Compliance; Monitoring substance use   Interventions: CBT, mental grounding techniques   Summary: Tamecka Milham is a 55 year old married Caucasian female on disability that presented today for therapy appointment with diagnoses of Major depressive disorder, recurrent, severe with anxious distress; and ADD.         Suicidal/Homicidal: None; without plan or intent.    Therapist Response: Clinician spoke with Hollan for telephone session today due to her inability to travel or access video meetings.  Clinician assessed for safety, sobriety, and medication  compliance.  Vera spoke in a manner that was alert, oriented x5, with no evidence or self-report of active SI/HI or A/V H.  Jackolyn reported ongoing compliance with medication.  She reported that she did use marijuana once over past week while her husband was gone, and felt guilty afterward.  Clinician encouraged her to stay mindful of consequences of her use and substitute for healthier coping skills instead.  Clinician inquired about Loeta's current emotional today, as well as any significant changes in thoughts, feelings, or behavior since previous session. Zorah reported scores of 6/10 for depression, 8/10 for anxiety, and 0/10 for anger/irritability.  Unice reported that she believes she had 'a few' panic attacks over the past week, stating "My son and husband went to [place] together, so that had me wound up.  It triggers some panic to think he might go back to [place] again without telling me".  Clinician recommended discussion on grounding techniques today with Ciji based upon ongoing issues with anxiety and panic attacks.  Clinician reminded her that grounding methods such as 5-4-3-2-1 technique can be utilized to temporarily distract from distressing thoughts, or feelings, and covered category of mental grounding techniques with her today, including examples for practice such as describing one's environment in detail, playing a categories game, describing an everyday activity in great detail, using one's imagination to visualize a calming image, and more.  Intervention was effective, as evidenced by [name] engaging in discussion on the subject and expressing receptiveness to some of the techniques, acknowledging that she does tend to focus her attention too much upon things she cannot control, and would benefit from redirecting her attention elsewhere in order  to reduce overall anxiety.  She also reported motivation for being more proactive with healthy distraction activities, such as visiting the farmers  [place] with a friend over the weekend to get out of the home.  Clinician will continue to monitor      Plan: Follow up again in 1 week.   Diagnosis: Major depressive disorder, recurrent, severe with anxious distress; and ADD.   Shade Flood, LCSW, LCAS 08/21/20

## 2020-09-04 ENCOUNTER — Other Ambulatory Visit: Payer: Self-pay

## 2020-09-04 ENCOUNTER — Ambulatory Visit (INDEPENDENT_AMBULATORY_CARE_PROVIDER_SITE_OTHER): Payer: Medicare Other | Admitting: Licensed Clinical Social Worker

## 2020-09-04 DIAGNOSIS — F332 Major depressive disorder, recurrent severe without psychotic features: Secondary | ICD-10-CM

## 2020-09-04 DIAGNOSIS — F988 Other specified behavioral and emotional disorders with onset usually occurring in childhood and adolescence: Secondary | ICD-10-CM

## 2020-09-04 NOTE — Progress Notes (Signed)
Virtual Visit via Telephone Note   I connected with [name] on 09/04/20 at 10:00am by telephone and verified that I am speaking with the correct person using two identifiers.   I discussed the limitations, risks, security and privacy concerns of performing an evaluation and management service by telephone and the availability of in person appointments. I also discussed with the patient that there may be a patient responsible charge related to this service. The patient expressed understanding and agreed to proceed.   I discussed the assessment and treatment plan with the patient. The patient was provided an opportunity to ask questions and all were answered. The patient agreed with the plan and demonstrated an understanding of the instructions.   The patient was advised to call back or seek an in-person evaluation if the symptoms worsen or if the condition fails to improve as anticipated.   I provided 1 hour of non-face-to-face time during this encounter.     Shade Flood, LCSW, LCAS ________________________________ THERAPIST PROGRESS NOTE   Session Time: 10:00am - 11:00am   Location: Patient: Patient Home Provider: [facility] [place] [facility]    Participation Level: Active   Behavioral Response: Alert, anxious and angry mood   Type of Therapy:  Individual Therapy   Treatment Goals addressed: Depression, panic attack, and anxiety management; Medication Compliance; Monitoring substance use   Interventions: CBT, assertive communication skills, marriage counseling referral    Summary: [name] is a 55 year old married Caucasian female on disability that presented today for therapy appointment with diagnoses of Major depressive disorder, recurrent, severe with anxious distress; and ADD.         Suicidal/Homicidal: None; without plan or intent.    Therapist Response: Clinician spoke with [name] for telephone appointment today due to her inability to travel or access video meetings.  Clinician  assessed for safety, sobriety, and medication compliance.  [name] spoke in a manner that was alert, oriented x5, with no evidence or self-report of active SI/HI or A/V H.  [name] reported that she continues taking medication as prescribed.  She reported that she did use marijuana x2 over past 2 weeks.  Clinician inquired about [name]'s emotional ratings today, as well as any significant changes in thoughts, feelings, or behavior since last session. [name] reported scores of 6/10 for depression, 10/10 for anxiety, and 10/10 for anger.  [name] reported that she believes she experienced panic attacks almost daily over the last week, stating "I'm going through some stuff right now".  Clinician inquired about [name]'s present stressors and how she has been attempting to cope.  She reported that her husband left unexpectedly for [place] again, leaving her alone to assist the son, who has [place].  She acknowledged that she used marijuana to alleviate some of this anxiety.  Clinician proposed discussion with Clemencia today on communication skills which could be utilized to increase understanding and support within the relationship.  Clinician covered a handout on 'soft startups' which offered suggestions on how [name] could address a problem assertively with her husband, including tips such as choosing an appropriate time/setting, being mindful of maintaining gentle tone, volume and language, while avoiding triggering nonverbals such as rolling eyes, as well as utilizing "I" statements to express feelings, focusing on one problem at a time, and being respectful.  Clinician also recommended that [name] consider proposing engagement in marriage counseling for additional support in addressing relationship issues.  Intervention was effective, as evidenced by [name] participating in discussion on this subject, and reporting increased motivation  to address abandonment and trust issues with the husband when he returns, stating "I'm going to  let him know how this makes me feels.  It affects me down to my core, and makes my anxiety and depression so much worse".  Clinician will continue to monitor      Plan: Follow up again in 1 week.   Diagnosis: Major depressive disorder, recurrent, severe with anxious distress; and ADD.   Shade Flood, LCSW, LCAS 09/04/20

## 2020-09-11 ENCOUNTER — Ambulatory Visit (INDEPENDENT_AMBULATORY_CARE_PROVIDER_SITE_OTHER): Payer: Medicare Other | Admitting: Licensed Clinical Social Worker

## 2020-09-11 DIAGNOSIS — F332 Major depressive disorder, recurrent severe without psychotic features: Secondary | ICD-10-CM | POA: Diagnosis not present

## 2020-09-11 DIAGNOSIS — F988 Other specified behavioral and emotional disorders with onset usually occurring in childhood and adolescence: Secondary | ICD-10-CM

## 2020-09-11 NOTE — Progress Notes (Signed)
Virtual Visit via Telephone Note   I connected with [name] on 09/11/20 at 10:00am by telephone and verified that I am speaking with the correct person using two identifiers.   I discussed the limitations, risks, security and privacy concerns of performing an evaluation and management service by telephone and the availability of in person appointments. I also discussed with the patient that there may be a patient responsible charge related to this service. The patient expressed understanding and agreed to proceed.   I discussed the assessment and treatment plan with the patient. The patient was provided an opportunity to ask questions and all were answered. The patient agreed with the plan and demonstrated an understanding of the instructions.   The patient was advised to call back or seek an in-person evaluation if the symptoms worsen or if the condition fails to improve as anticipated.   I provided 1 hour of non-face-to-face time during this encounter.     Shade Flood, LCSW, LCAS ________________________________ THERAPIST PROGRESS NOTE   Session Time: 10:00am - 11:00am   Location: Patient: Patient Home Provider: [facility] [place] [facility]    Participation Level: Active   Behavioral Response: Alert, anxious mood   Type of Therapy:  Individual Therapy   Treatment Goals addressed: Depression, panic attack, and anxiety management; Medication Compliance; Monitoring substance use   Interventions: CBT: challenging negative thinking     Summary: [name] is a 55 year old married Caucasian female on disability that presented today for therapy appointment with diagnoses of Major depressive disorder, recurrent, severe with anxious distress; and ADD.         Suicidal/Homicidal: None; without plan or intent.    Therapist Response: Clinician spoke with [name] for telephone session today due to her inability to travel or access video meetings.  Clinician assessed for safety, sobriety, and medication  compliance.  [name] spoke in a manner that was alert, oriented x5, with no evidence or self-report of active SI/HI or A/V H.  [name] reported ongoing compliance with medication.  She reported that she used marijuana x2-3 over past week.  Clinician inquired about [name]'s current emotional ratings, as well as any significant changes in thoughts, feelings, or behavior since last appointment. [name] reported scores of 5/10 for depression, 8/10 for anxiety, and 0/10 for anger/irritability.  [name] reported that she has not been having panic attacks, but feels overwhelmed often.  [name] stated "It seems like every time I turn around there is another problem and I can't get out from underneath them.  Its like I'm cursed".  Due to [name]'s report of increase in negative thoughts, clinician proposed discussing this topic today.  Clinician utilized a handout with [name] that defined negative thinking as irrational thoughts which can promote depression and anxiety, examined common cognitive distortions that might influence this mindset (I.e. catastrophizing, fortune telling, mind reading, etc), and offered tips for challenging them in order to improve overall mood/outlook, such as examining available evidence that counters these thoughts or utilizing positive affirmations as substitutes.  Intervention was effective, as evidenced by [name] actively engaging in discussion on the topic and identifying several examples of distortions to be wary of such as 'should' statements, assuming feelings are facts, catastrophizing, and more, but also identifying strategies for combating them such as practicing coping statements (I.e. "I'm doing the best I can" and "One day at a time") and engaging in healthy distraction activities like cleaning the kitchen.  [name] stated "I know I'm extra sensitive and it takes a lot out  of me, so I'm going to try to look at the positive side of things more".  Clinician will continue to monitor      Plan: Follow  up again in 1 week.   Diagnosis: Major depressive disorder, recurrent, severe with anxious distress; and ADD.   Shade Flood, LCSW, LCAS 09/11/20

## 2020-09-12 DIAGNOSIS — M4722 Other spondylosis with radiculopathy, cervical region: Secondary | ICD-10-CM | POA: Diagnosis not present

## 2020-09-12 DIAGNOSIS — M47816 Spondylosis without myelopathy or radiculopathy, lumbar region: Secondary | ICD-10-CM | POA: Diagnosis not present

## 2020-09-18 ENCOUNTER — Ambulatory Visit (INDEPENDENT_AMBULATORY_CARE_PROVIDER_SITE_OTHER): Payer: Medicare Other | Admitting: Licensed Clinical Social Worker

## 2020-09-18 ENCOUNTER — Other Ambulatory Visit: Payer: Self-pay

## 2020-09-18 ENCOUNTER — Telehealth (INDEPENDENT_AMBULATORY_CARE_PROVIDER_SITE_OTHER): Payer: Medicare Other | Admitting: Psychiatry

## 2020-09-18 ENCOUNTER — Encounter (HOSPITAL_COMMUNITY): Payer: Self-pay

## 2020-09-18 DIAGNOSIS — F339 Major depressive disorder, recurrent, unspecified: Secondary | ICD-10-CM

## 2020-09-18 DIAGNOSIS — F332 Major depressive disorder, recurrent severe without psychotic features: Secondary | ICD-10-CM | POA: Diagnosis not present

## 2020-09-18 DIAGNOSIS — F988 Other specified behavioral and emotional disorders with onset usually occurring in childhood and adolescence: Secondary | ICD-10-CM | POA: Diagnosis not present

## 2020-09-18 NOTE — Progress Notes (Signed)
Virtual Visit via Telephone Note   I connected with [name] on 09/18/20 at 10:00am by telephone and verified that I am speaking with the correct person using two identifiers.   I discussed the limitations, risks, security and privacy concerns of performing an evaluation and management service by telephone and the availability of in person appointments. I also discussed with the patient that there may be a patient responsible charge related to this service. The patient expressed understanding and agreed to proceed.   I discussed the assessment and treatment plan with the patient. The patient was provided an opportunity to ask questions and all were answered. The patient agreed with the plan and demonstrated an understanding of the instructions.   The patient was advised to call back or seek an in-person evaluation if the symptoms worsen or if the condition fails to improve as anticipated.   I provided 1 hour of non-face-to-face time during this encounter.     Shade Flood, LCSW, LCAS ________________________________ THERAPIST PROGRESS NOTE   Session Time: 10:00am - 11:00am   Location: Patient: Patient Home Provider: [facility] [place] [facility]    Participation Level: Active   Behavioral Response: Alert, anxious mood   Type of Therapy:  Individual Therapy   Treatment Goals addressed: Depression, panic attack, and anxiety management; Medication Compliance; Monitoring substance use   Interventions: CBT, mindfulness meditation for pain relief    Summary: [name] is a 55 year old married Caucasian female on disability that presented today for therapy appointment with diagnoses of Major depressive disorder, recurrent, severe with anxious distress; and ADD.         Suicidal/Homicidal: None; without plan or intent.    Therapist Response: Clinician spoke with [name] for telephone appointment today due to her inability to travel or access video meetings.  Clinician assessed for safety, sobriety,  and medication compliance.  [name] spoke in a manner that was alert, oriented x5, with no evidence or self-report of active SI/HI or A/V H.  [name] reported that she continues taking medication responsibly.  She reported that she used marijuana x4 over last week.  Clinician inquired about Maybell's emotional ratings today, as well as any significant changes in thoughts, feelings, or behavior since previous check-in. Kynzee reported scores of 6/10 for depression, 7/10 for anxiety, and 0/10 for anger/irritability.  [name] reported that she has not had any panic attacks, but has been dealing with headaches and back pain that have made it difficult to focus on self-care.  [name] reported that she was able to go out with a friend for dinner and organize some things around the home when pain was more tolerable, stating "It feels good when I can accomplish something".  Clinician offered to teach [name] a mindfulness meditation exercise today focused on pain relief, and she was agreeable to this.  Clinician guided [name] through process of getting comfortable, achieving relaxing breathing rhythm, and then practicing combination activity involving comprehensive body scan, and reciting pain relief affirmations. Intervention was effective, as evidenced by [name] participating in activity successfully and reporting that although she cannot sit for long periods of time comfortably without moving, she did feel more relaxed afterward, and was able to numb the pain in her back temporarily.  Clinician encouraged her to include this practice in her developing self-care routine in order to replace maladaptive behavior such as substance use, and will continue to monitor      Plan: Follow up again in 1 week.   Diagnosis: Major depressive disorder, recurrent, severe  with anxious distress; and ADD.   Shade Flood, LCSW, LCAS 09/18/20

## 2020-09-18 NOTE — Progress Notes (Signed)
09/18/2020 at 2,05 PM  Patient had appointment scheduled today. Did not present for appointment . I contacted her via phone - no answer at this time.  Will ask front desk staff to contact her and reschedule appointment   Gabriel Earing, MD

## 2020-09-21 ENCOUNTER — Other Ambulatory Visit: Payer: Self-pay | Admitting: Internal Medicine

## 2020-09-21 DIAGNOSIS — N63 Unspecified lump in unspecified breast: Secondary | ICD-10-CM

## 2020-09-23 ENCOUNTER — Other Ambulatory Visit: Payer: Self-pay | Admitting: Cardiology

## 2020-10-05 ENCOUNTER — Telehealth: Payer: Self-pay | Admitting: Cardiology

## 2020-10-05 NOTE — Telephone Encounter (Signed)
[name] is calling stating Dr. Percival Spanish had suggest pool rehab awhile back, but it never happen. She is requesting a callback with the information to get in contact with them to set it up. She states she does not know how to use my chart that is something her husband setup and he is not here, so she would only like to here back via phone call. Please advise.

## 2020-10-05 NOTE — Telephone Encounter (Signed)
Returned call to patient left message on personal voice mail to call back. 

## 2020-10-09 ENCOUNTER — Ambulatory Visit: Payer: Medicare Other | Admitting: Cardiology

## 2020-10-09 NOTE — Telephone Encounter (Signed)
Spoke to patient she stated Dr.Hochrein wanted her to go to a pool rehab.Stated she never went and wanted to know name of pool.Advised I will send message to Dr.Hochrein.

## 2020-10-10 NOTE — Telephone Encounter (Signed)
Called patient left Dr.Hochrein's advice on personal voice mail.

## 2020-10-26 ENCOUNTER — Other Ambulatory Visit: Payer: Medicare Other

## 2020-10-29 ENCOUNTER — Other Ambulatory Visit (HOSPITAL_COMMUNITY): Payer: Self-pay | Admitting: Psychiatry

## 2020-11-01 ENCOUNTER — Ambulatory Visit (HOSPITAL_COMMUNITY): Payer: Medicare Other | Admitting: Psychiatry

## 2020-12-27 NOTE — Progress Notes (Signed)
Cardiology Office Note   Date:  12/29/2020   ID:  Theora Vankirk, DOB 07/23/1965, MRN [id number]  PCP:  [name], MD  Cardiologist:   Minus Breeding, MD  Chief Complaint  Patient presents with   Shortness of Breath       History of Present Illness: [name] is a 55 y.o. female who initially seen by me in 2016 for initial sleep clinic evaluation after initiation of CPAP therapy for obstructive sleep apnea. She had cath 04/11/16. This revealed normal coronaries, EF 25-30%, mild pulmonary HTN, and elevated LVEDP.  She was in the hospital in August 2018.  She had acute on chronic systolic and diastolic HF. She presents for follow up.   EF 12/2017 was 30 - 35%.  Her most recent echo demonstrated the EF to be 50 - 55% in Nov.    She called recently.  Unfortunately she is had severe problems with depression.  She has not been leaving her home.  She has not been taking her medications.  She has had weight gain and increased dyspnea.  We left her stone instructions recently on how to restart her medications since she been off them for a while.  She is currently taking them as listed below.  We did follow-up with some blood work recently and her thyroid electrolytes and CBC were essentially normal.  She presents for office follow-up.  She had become depressed and discontinued her meds but we restarted them and have titrated them upward.  She continues to be very depressed but her husband is helping a little bit more and he helps her take her medications.  She is breathing better than she was when she was off of her meds and she has tolerated med titration.  She actually just joined the [facility] that she has not started exercising.  She denies any new shortness of breath, PND or orthopnea.  She said no palpitations, presyncope or syncope.  She is had no new chest discomfort.  She has some left lower extremity swelling.    Past Medical History:  Diagnosis Date   ADD (attention deficit disorder)     Anxiety    Arthritis    both knees right worse than left   Carpal tunnel syndrome of right wrist    Depression    Essential hypertension, benign    Gallstones    History of smoking 06/22/2016   Hyperlipidemia associated with type 2 diabetes mellitus ([place]), on Zocor 04/04/2011   Hypertension associated with diabetes ([place]) 06/03/2008   Migraines    Morbid obesity ([place])    Morbid obesity with BMI of 50.0-59.9, adult ([place]) 07/12/2006   NICM (nonischemic cardiomyopathy) ([place]) 07/12/2006   01/16/18 ECHO:    - Procedure narrative: Transthoracic echocardiography. Image   quality was suboptimal. The study was technically difficult.   Intravenous contrast (Definity) was administered. - Left ventricle: The cavity size was moderately dilated. Wall   thickness was increased in a pattern of mild LVH. Systolic   function was moderately to severely reduced. The estimated   ejection fraction w   Normal coronary arteries 04/17/2016   OSA on CPAP 10/28/2014   Spinal stenosis    back pain   Spondylosis without myelopathy or radiculopathy, lumbar region 12/04/2017   Type 2 diabetes mellitus with hyperglycemia [facility])     Past Surgical History:  Procedure Laterality Date   CHOLECYSTECTOMY     DILATATION & CURETTAGE/HYSTEROSCOPY WITH MYOSURE N/A 10/31/2017   Procedure: DILATATION & CURETTAGE/HYSTEROSCOPY  WITH MYOSURE;  Surgeon: Salvadore Dom, MD;  Location: [facility];  Service: Gynecology;  Laterality: N/A;   RIGHT/LEFT HEART CATH AND CORONARY ANGIOGRAPHY N/A 04/11/2016   Procedure: Right/Left Heart Cath and Coronary Angiography;  Surgeon: Burnell Blanks, MD;  Location: [place] [facility];  Service: Cardiovascular;  Laterality: N/A;   sonogram for blood clots     no blockages     Current Outpatient Medications  Medication Sig Dispense Refill   amLODipine (NORVASC) 5 MG tablet Take 1 tablet by mouth daily.     ARIPiprazole (ABILIFY) 2 MG tablet Take 1 tablet (2 mg total) by mouth daily. 30 tablet 1    busPIRone (BUSPAR) 15 MG tablet Take 1 tablet (15 mg total) by mouth 2 (two) times daily. 60 tablet 1   carvedilol (COREG) 12.5 MG tablet Take 1 tablet (12.5 mg total) by mouth 2 (two) times daily with a meal. 180 tablet 1   ciclopirox (LOPROX) 0.77 % cream Apply topically 2 (two) times daily. 90 g 1   clotrimazole-betamethasone (LOTRISONE) cream APPLY TO AFFECTED AREA TWICE A DAY 45 g 2   Dapagliflozin-metFORMIN HCl ER (XIGDUO XR) 11-998 MG TB24 Take 1 tablet by mouth daily. 90 tablet 3   fluticasone (FLONASE) 50 MCG/ACT nasal spray Place 2 sprays into both nostrils daily. 48 g 1   furosemide (LASIX) 80 MG tablet Take 1 tablet (80 mg total) by mouth 2 (two) times daily. 60 tablet 11   linaclotide (LINZESS) 145 MCG CAPS capsule Take 1 capsule (145 mcg total) by mouth daily before breakfast. 90 capsule 1   LORazepam (ATIVAN) 0.5 MG tablet Take 1 tablet (0.5 mg total) by mouth 2 (two) times daily as needed for anxiety. 20 tablet 1   potassium chloride SA (KLOR-CON M20) 20 MEQ tablet Take 2 tablets (40 mEq total) by mouth daily. 180 tablet 3   sacubitril-valsartan (ENTRESTO) 97-103 MG Take 1 tablet by mouth 2 (two) times daily. 60 tablet 3   sertraline (ZOLOFT) 100 MG tablet Take 1.5 tablets (150 mg total) by mouth daily. 45 tablet 1   simvastatin (ZOCOR) 20 MG tablet TAKE 1 TABLET BY MOUTH EVERY DAY 90 tablet 2   traMADol (ULTRAM) 50 MG tablet Take 1 tablet (50 mg total) by mouth every 12 (twelve) hours as needed. 60 tablet 5   No current facility-administered medications for this visit.    Allergies:   Fluoxetine, Cymbalta [duloxetine hcl], and Penicillins    ROS:  Please see the history of present illness.   Otherwise, review of systems are positive for none.   All other systems are reviewed and negative.    PHYSICAL EXAM: VS:  BP 130/77   Pulse 81   Ht 5\' 4"  (1.626 m)   Wt (!) 333 lb 6.4 oz (151.2 kg)   SpO2 92%   BMI 57.23 kg/m  , BMI Body mass index is 57.23 kg/m. GENERAL:  Well  appearing NECK:  No jugular venous distention, waveform within normal limits, carotid upstroke brisk and symmetric, no bruits, no thyromegaly LUNGS:  Clear to auscultation bilaterally CHEST:  Unremarkable HEART:  PMI not displaced or sustained,S1 and S2 within normal limits, no S3, no S4, no clicks, no rubs, no murmurs ABD:  Flat, positive bowel sounds normal in frequency in pitch, no bruits, no rebound, no guarding, no midline pulsatile mass, no hepatomegaly, no splenomegaly EXT:  2 plus pulses throughout, left leg edema, no cyanosis no clubbing   EKG:  EKG is  ordered  today. Sinus rhythm, rate 81, leftward axis, premature ventricular contractions, poor anterior R wave progression   Recent Labs: 02/10/2020: ALT 25 06/15/2020: Hemoglobin 14.2; Platelets 262; TSH 4.260 06/19/2020: BNP 106.5; BUN 22; Creatinine, Ser 0.87; Potassium 4.2; Sodium 141    Lipid Panel    Component Value Date/Time   CHOL 193 02/10/2020 1536   TRIG 147.0 02/10/2020 1536   HDL 48.90 02/10/2020 1536   CHOLHDL 4 02/10/2020 1536   VLDL 29.4 02/10/2020 1536   LDLCALC 115 (H) 02/10/2020 1536   LDLDIRECT 143.8 04/03/2011 0929      Wt Readings from Last 3 Encounters:  12/29/20 (!) 333 lb 6.4 oz (151.2 kg)  08/03/20 (!) 338 lb 6.4 oz (153.5 kg)  07/25/20 (!) 335 lb (152 kg)      Other studies Reviewed: Additional studies/ records that were reviewed today include: Labs .    Review of the above records demonstrates:    See elsewhere    ASSESSMENT AND PLAN:  DYSPNEA -  This is improved.  I will continue to titrate meds.   CHRONIC SYSTOLIC HF -  Today I will increase the Entresto to 97/103 twice daily.  I will check a basic metabolic profile in about a week.  We are going to facilitate exercise at the [facility] with the [facility].   ESSENTIAL HYPERTENSION, BENIGN -  This is being managed in the context of treating his CHF  SLEEP APNEA - We are putting in an order for new CPAP.  DEPRESSION - She is to  have this managed by her primary provider.  DM -  Per [name], MD her last A1c was 6.7 today.  Current medicines are reviewed at length with the patient today.  The patient does not have concerns regarding medicines.  The following changes have been made:    Labs/ tests ordered today include:   Orders Placed This Encounter  Procedures   Basic metabolic panel   CBC   EKG 12-Lead      Disposition:   FU with APP in 3 months.    Signed, Minus Breeding, MD  12/29/2020 11:35 AM    [place]

## 2020-12-29 ENCOUNTER — Ambulatory Visit (INDEPENDENT_AMBULATORY_CARE_PROVIDER_SITE_OTHER): Payer: Medicare Other | Admitting: Cardiology

## 2020-12-29 ENCOUNTER — Encounter: Payer: Self-pay | Admitting: Cardiology

## 2020-12-29 ENCOUNTER — Telehealth: Payer: Self-pay

## 2020-12-29 ENCOUNTER — Other Ambulatory Visit: Payer: Self-pay

## 2020-12-29 ENCOUNTER — Telehealth: Payer: Self-pay | Admitting: *Deleted

## 2020-12-29 VITALS — BP 130/77 | HR 81 | Ht 64.0 in | Wt 333.4 lb

## 2020-12-29 DIAGNOSIS — E1165 Type 2 diabetes mellitus with hyperglycemia: Secondary | ICD-10-CM | POA: Diagnosis not present

## 2020-12-29 DIAGNOSIS — I10 Essential (primary) hypertension: Secondary | ICD-10-CM

## 2020-12-29 DIAGNOSIS — I5022 Chronic systolic (congestive) heart failure: Secondary | ICD-10-CM | POA: Diagnosis not present

## 2020-12-29 DIAGNOSIS — Z79899 Other long term (current) drug therapy: Secondary | ICD-10-CM | POA: Diagnosis not present

## 2020-12-29 MED ORDER — SACUBITRIL-VALSARTAN 97-103 MG PO TABS: 1.0000 | ORAL_TABLET | Freq: Two times a day (BID) | ORAL | 3 refills | Status: DC

## 2020-12-29 NOTE — Telephone Encounter (Signed)
Patient was seen in the office today by Dr Percival Spanish. She is requesting a new CPAP machine. The one she has is "old" and will not transmit. Order for Airsense 11 sent to [facility]. The patient requested a transfer from [place].

## 2020-12-29 NOTE — Telephone Encounter (Signed)
Received message to call pt reference pool exercise and recs to go to [facility] pt-sounds as though she was referred to [facility] prior to pandemic and did not attend.  Interested in doing pool exercises. Call interrupted as she is out voting. Sent message to MD office to request PRE[facility] referral if appropriate.

## 2020-12-29 NOTE — Addendum Note (Signed)
Addended by: Jacqulynn Cadet on: 12/29/2020 12:28 PM   Modules accepted: Orders

## 2020-12-29 NOTE — Patient Instructions (Signed)
Medication Instructions:  INCREASE Entresto to 97-103 2 times a day.  *If you need a refill on your cardiac medications before your next appointment, please call your pharmacy*  Lab Work: Your physician recommends that you return for lab work in 1 week:  BMET CBC If you have labs (blood work) drawn today and your tests are completely normal, you will receive your results only by: [place] (if you have MyChart) OR A paper copy in the mail If you have any lab test that is abnormal or we need to change your treatment, we will call you to review the results.  Testing/Procedures: NONE ordered at this time of appointment   Follow-Up: At [facility], you and your health needs are our priority.  As part of our continuing mission to provide you with exceptional heart care, we have created designated Provider Care Teams.  These Care Teams include your primary Cardiologist (physician) and Advanced Practice Providers (APPs -  Physician Assistants and Nurse Practitioners) who all work together to provide you with the care you need, when you need it.  We recommend signing up for the patient portal called "MyChart".  Sign up information is provided on this After Visit Summary.  MyChart is used to connect with patients for Virtual Visits (Telemedicine).  Patients are able to view lab/test results, encounter notes, upcoming appointments, etc.  Non-urgent messages can be sent to your provider as well.   To learn more about what you can do with MyChart, go to [email or web address].    Your next appointment:   3 month(s)  The format for your next appointment:   In Person  Provider:   APP   Other Instructions

## 2021-01-08 DIAGNOSIS — I5022 Chronic systolic (congestive) heart failure: Secondary | ICD-10-CM | POA: Diagnosis not present

## 2021-01-08 DIAGNOSIS — I10 Essential (primary) hypertension: Secondary | ICD-10-CM | POA: Diagnosis not present

## 2021-01-08 DIAGNOSIS — Z79899 Other long term (current) drug therapy: Secondary | ICD-10-CM | POA: Diagnosis not present

## 2021-01-09 LAB — CBC
Hematocrit: 48.5 % — ABNORMAL HIGH (ref 34.0–46.6)
Hemoglobin: 15.9 g/dL (ref 11.1–15.9)
MCH: 27.3 pg (ref 26.6–33.0)
MCHC: 32.8 g/dL (ref 31.5–35.7)
MCV: 83 fL (ref 79–97)
Platelets: 275 10*3/uL (ref 150–450)
RBC: 5.82 x10E6/uL — ABNORMAL HIGH (ref 3.77–5.28)
RDW: 14.2 % (ref 11.7–15.4)
WBC: 10.2 10*3/uL (ref 3.4–10.8)

## 2021-01-09 LAB — BASIC METABOLIC PANEL
BUN/Creatinine Ratio: 22 (ref 9–23)
BUN: 17 mg/dL (ref 6–24)
CO2: 27 mmol/L (ref 20–29)
Calcium: 9.6 mg/dL (ref 8.7–10.2)
Chloride: 100 mmol/L (ref 96–106)
Creatinine, Ser: 0.79 mg/dL (ref 0.57–1.00)
Glucose: 109 mg/dL — ABNORMAL HIGH (ref 70–99)
Potassium: 4.1 mmol/L (ref 3.5–5.2)
Sodium: 145 mmol/L — ABNORMAL HIGH (ref 134–144)
eGFR: 88 mL/min/{1.73_m2} (ref >59)

## 2021-01-16 ENCOUNTER — Encounter: Payer: Self-pay | Admitting: *Deleted

## 2021-01-29 ENCOUNTER — Other Ambulatory Visit: Payer: Self-pay | Admitting: Internal Medicine

## 2021-01-29 ENCOUNTER — Telehealth: Payer: Self-pay | Admitting: Internal Medicine

## 2021-01-29 NOTE — Telephone Encounter (Signed)
Patient requesting a call back to discuss medications.

## 2021-01-29 NOTE — Telephone Encounter (Signed)
Pt would like to know is she could have enough refills to get her through to her January OV that scheduled today. If refills are not granted would you like for me to move her OV up to this week or next? Please advise.

## 2021-01-30 NOTE — Telephone Encounter (Signed)
Called pt, LVM.   Needing clarification on what Rx's she need.

## 2021-02-07 ENCOUNTER — Other Ambulatory Visit: Payer: Self-pay | Admitting: Family Medicine

## 2021-02-09 NOTE — Progress Notes (Signed)
[facility] Eit[facility] [place] [address] [place] Phone: [phone] Subjective:   IVilma Meckel, am serving as a scribe for Dr. Hulan Saas. This visit occurred during the SARS-CoV-2 public health emergency.  Safety protocols were in place, including screening questions prior to the visit, additional usage of staff PPE, and extensive cleaning of exam room while observing appropriate contact time as indicated for disinfecting solutions.   I'm seeing this patient by the request  of:  [name], MD  CC: Low back pain, knee pain  GYJ:[id number]  03/08/2020 Patient has had this problem for quite some time.  Do look through patient's previous imaging.  Patient does have a high anxiety that also I think is contributing.  We did discuss weight loss would be important for her.  Patient knows this but wants her back to feel better.  We discussed that due to her size at this point I would recommend aquatic physical therapy and patient was referred appropriately.  Due to patient's other comorbidities did not feel comfortable adding other medications at this time.  We did bring up the idea of gabapentin and she told me that it did not seem to work.  Patient is on very high doses of Lasix for her congestive heart failure and would need to further evaluate but would want to avoid probably chronic anti-inflammatories.  Patient will see me again in 6 to 8 weeks.  Continuing to have difficulty I would consider advanced imaging and questionable epidurals to help her with the pain.  We will also need to discuss potential gastric bypass if this continues.  Contributing to the weight.  We will discuss at follow-up about possible referral to either healthy weight and wellness or the possibility to gastric bypass encouraged her to work on weight if she can and will start the aquatic therapy so less force on her back  Updated 02/12/2021 Anaka Beazer is a 55 y.o. female coming in  with complaint of bilateral knee and LBP. Curve spine can' walk or stand for more than a min without having pain. No interventions have helped with the pain. Knees have been hurting for a while. Back is much more in pain than the knees. Wants to do the aquatic PT. patient is looking for some type of improvement if possible.  Patient states that unfortunately the pain keeps her from doing anything.  Even sitting for a long duration of her standing at the dishwasher causes increasing discomfort.       Past Medical History:  Diagnosis Date   ADD (attention deficit disorder)    Anxiety    Arthritis    both knees right worse than left   Carpal tunnel syndrome of right wrist    Depression    Essential hypertension, benign    Gallstones    History of smoking 06/22/2016   Hyperlipidemia associated with type 2 diabetes mellitus ([place]), on Zocor 04/04/2011   Hypertension associated with diabetes ([place]) 06/03/2008   Migraines    Morbid obesity ([place])    Morbid obesity with BMI of 50.0-59.9, adult ([place]) 07/12/2006   NICM (nonischemic cardiomyopathy) ([place]) 07/12/2006   01/16/18 ECHO:    - Procedure narrative: Transthoracic echocardiography. Image   quality was suboptimal. The study was technically difficult.   Intravenous contrast (Definity) was administered. - Left ventricle: The cavity size was moderately dilated. Wall   thickness was increased in a pattern of mild LVH. Systolic   function was moderately to severely  reduced. The estimated   ejection fraction w   Normal coronary arteries 04/17/2016   OSA on CPAP 10/28/2014   Spinal stenosis    back pain   Spondylosis without myelopathy or radiculopathy, lumbar region 12/04/2017   Type 2 diabetes mellitus with hyperglycemia [facility])    Past Surgical History:  Procedure Laterality Date   CHOLECYSTECTOMY     DILATATION & CURETTAGE/HYSTEROSCOPY WITH MYOSURE N/A 10/31/2017   Procedure: DILATATION & CURETTAGE/HYSTEROSCOPY WITH MYOSURE;  Surgeon: Salvadore Dom,  MD;  Location: [place] [facility];  Service: Gynecology;  Laterality: N/A;   RIGHT/LEFT HEART CATH AND CORONARY ANGIOGRAPHY N/A 04/11/2016   Procedure: Right/Left Heart Cath and Coronary Angiography;  Surgeon: Burnell Blanks, MD;  Location: [place] [facility];  Service: Cardiovascular;  Laterality: N/A;   sonogram for blood clots     no blockages   Social History   Socioeconomic History   Marital status: Married    Spouse name: Not on file   Number of children: Not on file   Years of education: Not on file   Highest education level: Not on file  Occupational History   Not on file  Tobacco Use   Smoking status: Former    Packs/day: 0.50    Types: Cigarettes    Quit date: 02/25/1997    Years since quitting: 23.9   Smokeless tobacco: Never   Tobacco comments:    Married, lives with spouse (when he is not traveling) and son  Vaping Use   Vaping Use: Never used  Substance and Sexual Activity   Alcohol use: No   Drug use: No   Sexual activity: Yes    Partners: Male    Birth control/protection: None  Other Topics Concern   Not on file  Social History Narrative   Not on file   Social Determinants of Health   Financial Resource Strain: Medium Risk   Difficulty of Paying Living Expenses: Somewhat hard  Food Insecurity: No Food Insecurity   Worried About Charity fundraiser in the Last Year: Never true   Ran Out of Food in the Last Year: Never true  Transportation Needs: No Transportation Needs   Lack of Transportation (Medical): No   Lack of Transportation (Non-Medical): No  Physical Activity: Inactive   Days of Exercise per Week: 0 days   Minutes of Exercise per Session: 0 min  Stress: No Stress Concern Present   Feeling of Stress : Not at all  Social Connections: Moderately Isolated   Frequency of Communication with Friends and Family: More than three times a week   Frequency of Social Gatherings with Friends and Family: Once a week   Attends Religious Services: Never    Marine scientist or Organizations: No   Attends Music therapist: Never   Marital Status: Married   Allergies  Allergen Reactions   Fluoxetine Other (See Comments)    More depressed   Cymbalta [Duloxetine Hcl] Other (See Comments)    depressed   Penicillins     REACTION: rash Has patient had a PCN reaction causing immediate rash, facial/tongue/throat swelling, SOB or lightheadedness with hypotension: YES Has patient had a PCN reaction causing severe rash involving mucus membranes or skin necrosis: NO Has patient had a PCN reaction that required hospitalization: YES Has patient had a PCN reaction occurring within the last 10 years: NO If all of the above answers are "NO", then may proceed with Cephalosporin use.    Family History  Problem Relation  Age of Onset   Depression Mother    Anxiety disorder Mother    Diabetes Mother    Hypertension Mother    ADD / ADHD Father    Diabetes Father    Hypertension Father    Hypertension Other    Diabetes Other    Colitis Other    Alcohol abuse Other     Current Outpatient Medications (Endocrine & Metabolic):    Dapagliflozin-metFORMIN HCl ER (XIGDUO XR) 11-998 MG TB24, Take 1 tablet by mouth daily.  Current Outpatient Medications (Cardiovascular):    carvedilol (COREG) 12.5 MG tablet, Take 1 tablet (12.5 mg total) by mouth 2 (two) times daily with a meal.   carvedilol (COREG) 6.25 MG tablet, Take 6.25 mg by mouth 2 (two) times daily with a meal.   furosemide (LASIX) 80 MG tablet, Take 1 tablet (80 mg total) by mouth 2 (two) times daily.   sacubitril-valsartan (ENTRESTO) 97-103 MG, Take 1 tablet by mouth 2 (two) times daily.   simvastatin (ZOCOR) 20 MG tablet, TAKE 1 TABLET BY MOUTH EVERY DAY  Current Outpatient Medications (Respiratory):    fluticasone (FLONASE) 50 MCG/ACT nasal spray, Place 2 sprays into both nostrils daily.  Current Outpatient Medications (Analgesics):    traMADol (ULTRAM) 50 MG tablet, Take 1  tablet (50 mg total) by mouth every 12 (twelve) hours as needed.   Current Outpatient Medications (Other):    ARIPiprazole (ABILIFY) 2 MG tablet, Take 1 tablet (2 mg total) by mouth daily.   busPIRone (BUSPAR) 15 MG tablet, Take 1 tablet (15 mg total) by mouth 2 (two) times daily.   ciclopirox (LOPROX) 0.77 % cream, Apply topically 2 (two) times daily.   clotrimazole-betamethasone (LOTRISONE) cream, APPLY TO AFFECTED AREA TWICE A DAY   linaclotide (LINZESS) 145 MCG CAPS capsule, Take 1 capsule (145 mcg total) by mouth daily before breakfast.   LORazepam (ATIVAN) 0.5 MG tablet, Take 1 tablet (0.5 mg total) by mouth 2 (two) times daily as needed for anxiety.   potassium chloride SA (KLOR-CON M20) 20 MEQ tablet, Take 2 tablets (40 mEq total) by mouth daily.   sertraline (ZOLOFT) 100 MG tablet, Take 1.5 tablets (150 mg total) by mouth daily.   Reviewed prior external information including notes and imaging from  primary care provider As well as notes that were available from care everywhere and other healthcare systems.  Patient did have the MRI of the lumbar spine that were independently visualized by me today showing the patient in June 2022 had facet arthropathy and degenerative changes of multiple areas with foraminal narrowing causing nerve impingement at the L4-L5 and L5-S1 areas.  Past medical history, social, surgical and family history all reviewed in electronic medical record.  No pertanent information unless stated regarding to the chief complaint.   Review of Systems:  No headache, visual changes, nausea, vomiting, diarrhea, constipation, dizziness, abdominal pain, skin rash, fevers, chills, night sweats, weight loss, swollen lymph nodes, joint swelling, chest pain, shortness of breath, mood changes. POSITIVE muscle aches, body aches  Objective  Blood pressure 122/76, pulse 85, height 5\' 4"  (1.626 m), weight (!) 323 lb (146.5 kg), SpO2 94 %.   General: No apparent distress alert and  oriented x3 mood and affect normal, dressed appropriately.  HEENT: Pupils equal, extraocular movements intact  Respiratory: Patient's speak in full sentences and does not appear short of breath  Cardiovascular: No lower extremity edema, non tender, no erythema  Gait antalgic MSK: Low back exam does have significant poor core strength.  Patient has difficulty getting out of the chair.  Severe tenderness over the L4-L5 area and L5-S1 right greater than left.  No midline tenderness.  Unable to do [facility] or significant straight leg test secondary to body habitus   Impression and Recommendations:     The above documentation has been reviewed and is accurate and complete Lyndal Pulley, DO

## 2021-02-12 ENCOUNTER — Ambulatory Visit (INDEPENDENT_AMBULATORY_CARE_PROVIDER_SITE_OTHER): Payer: Medicare Other | Admitting: Family Medicine

## 2021-02-12 ENCOUNTER — Other Ambulatory Visit: Payer: Self-pay

## 2021-02-12 ENCOUNTER — Encounter: Payer: Self-pay | Admitting: Family Medicine

## 2021-02-12 VITALS — BP 122/76 | HR 85 | Ht 64.0 in | Wt 323.0 lb

## 2021-02-12 DIAGNOSIS — G8929 Other chronic pain: Secondary | ICD-10-CM

## 2021-02-12 DIAGNOSIS — Z6841 Body Mass Index (BMI) 40.0 and over, adult: Secondary | ICD-10-CM

## 2021-02-12 DIAGNOSIS — M545 Low back pain, unspecified: Secondary | ICD-10-CM | POA: Diagnosis not present

## 2021-02-12 NOTE — Patient Instructions (Addendum)
[place] [phone] Call Today Referral for Aquatic PT and Weight Management Do prescribed exercises at least 3x a week See you again in 6-8 weeks

## 2021-02-12 NOTE — Assessment & Plan Note (Signed)
Patient has had some chronic pain for quite some time.  In June patient did have an MRI.  MRI did show that patient did have degenerative changes most severe seeming to be at the L5-S1 area.  Facet arthropathy at multiple levels.  Discussed with patient about the possibility of an epidural that I think will be beneficial at the L4-L5 or L5-S1 levels.  Patient is willing to do this and hopefully will be beneficial.  Patient has also gained considerable amount of weight and will refer to healthy weight and wellness.  Patient is a diabetic and I do think a different medications could help both of these problems.  I do believe weight loss would make a significant improvement in the low back pain as well.  In addition to this once patient is feeling better we will have patient start aquatic physical therapy and patient had referral placed today as well.  Follow-up with me again in 4 to 8 weeks

## 2021-02-13 ENCOUNTER — Telehealth: Payer: Self-pay

## 2021-02-13 NOTE — Telephone Encounter (Signed)
Pt was very anxious/nervous for upcoming injection with [place] at [place] on 02/20/21. Valium was called into patients pharmacy, [facility], for her to take 30 min prior to her appointment time. Pt advised to have a driver the day of taking these medications and not to take any other muscle relaxer's or anti-anxiety medications while taking Valium, pt verbalized understanding. This was called in as a prescription and not given at the facility due to patients appointment time being 7:30 AM.

## 2021-02-20 ENCOUNTER — Ambulatory Visit
Admission: RE | Admit: 2021-02-20 | Discharge: 2021-02-20 | Disposition: A | Discharge status: Home / Self Care | Payer: Medicare Other | Source: Ambulatory Visit | Attending: Family Medicine | Admitting: Family Medicine

## 2021-02-20 ENCOUNTER — Other Ambulatory Visit: Payer: Self-pay

## 2021-02-20 DIAGNOSIS — R202 Paresthesia of skin: Secondary | ICD-10-CM | POA: Diagnosis not present

## 2021-02-20 DIAGNOSIS — G8929 Other chronic pain: Secondary | ICD-10-CM

## 2021-02-20 DIAGNOSIS — M545 Low back pain, unspecified: Secondary | ICD-10-CM | POA: Diagnosis not present

## 2021-02-20 NOTE — Discharge Instructions (Signed)

## 2021-02-27 ENCOUNTER — Other Ambulatory Visit: Payer: Self-pay

## 2021-02-27 ENCOUNTER — Encounter (HOSPITAL_BASED_OUTPATIENT_CLINIC_OR_DEPARTMENT_OTHER): Payer: Self-pay | Admitting: Physical Therapy

## 2021-02-27 ENCOUNTER — Ambulatory Visit (HOSPITAL_BASED_OUTPATIENT_CLINIC_OR_DEPARTMENT_OTHER): Payer: Medicare Other | Attending: Family Medicine | Admitting: Physical Therapy

## 2021-02-27 DIAGNOSIS — G8929 Other chronic pain: Secondary | ICD-10-CM | POA: Diagnosis not present

## 2021-02-27 DIAGNOSIS — M545 Low back pain, unspecified: Secondary | ICD-10-CM | POA: Insufficient documentation

## 2021-02-27 DIAGNOSIS — M25561 Pain in right knee: Secondary | ICD-10-CM | POA: Diagnosis not present

## 2021-02-27 NOTE — Therapy (Signed)
OUTPATIENT PHYSICAL THERAPY THORACOLUMBAR EVALUATION   Patient Name: [name] MRN: [id number] DOB:February 24, 1966, 56 y.o., female Today's Date: 02/28/2021   PT End of Session - 02/28/21 1648     Visit Number 1    Number of Visits 12    Date for PT Re-Evaluation 04/11/21    Authorization Type MCR MCD    PT Start Time 1345    PT Stop Time 1429    PT Time Calculation (min) 44 min    Activity Tolerance Patient tolerated treatment well    Behavior During Therapy WFL for tasks assessed/performed             Past Medical History:  Diagnosis Date   ADD (attention deficit disorder)    Anxiety    Arthritis    both knees right worse than left   Carpal tunnel syndrome of right wrist    Depression    Essential hypertension, benign    Gallstones    History of smoking 06/22/2016   Hyperlipidemia associated with type 2 diabetes mellitus ([place]), on Zocor 04/04/2011   Hypertension associated with diabetes ([place]) 06/03/2008   Migraines    Morbid obesity ([place])    Morbid obesity with BMI of 50.0-59.9, adult ([place]) 07/12/2006   NICM (nonischemic cardiomyopathy) ([place]) 07/12/2006   01/16/18 ECHO:    - Procedure narrative: Transthoracic echocardiography. Image   quality was suboptimal. The study was technically difficult.   Intravenous contrast (Definity) was administered. - Left ventricle: The cavity size was moderately dilated. Wall   thickness was increased in a pattern of mild LVH. Systolic   function was moderately to severely reduced. The estimated   ejection fraction w   Normal coronary arteries 04/17/2016   OSA on CPAP 10/28/2014   Spinal stenosis    back pain   Spondylosis without myelopathy or radiculopathy, lumbar region 12/04/2017   Type 2 diabetes mellitus with hyperglycemia [facility])    Past Surgical History:  Procedure Laterality Date   CHOLECYSTECTOMY     DILATATION & CURETTAGE/HYSTEROSCOPY WITH MYOSURE N/A 10/31/2017   Procedure: DILATATION & CURETTAGE/HYSTEROSCOPY WITH MYOSURE;  Surgeon:  Salvadore Dom, MD;  Location: [facility];  Service: Gynecology;  Laterality: N/A;   RIGHT/LEFT HEART CATH AND CORONARY ANGIOGRAPHY N/A 04/11/2016   Procedure: Right/Left Heart Cath and Coronary Angiography;  Surgeon: Burnell Blanks, MD;  Location: [facility];  Service: Cardiovascular;  Laterality: N/A;   sonogram for blood clots     no blockages   Patient Active Problem List   Diagnosis Date Noted   Need for hepatitis C screening test 07/25/2020   Atypical nevus of back 07/25/2020   Athlete's foot on right 07/25/2020   Need for shingles vaccine 07/25/2020   Non-seasonal allergic rhinitis due to pollen 07/25/2020   Chronic idiopathic constipation 07/25/2020   Visit for screening mammogram 02/11/2020   Chronic bilateral low back pain without sciatica 02/10/2020   Morbid obesity ([place]) 01/07/2019   Spondylosis without myelopathy or radiculopathy, lumbar region 12/04/2017   Type 2 diabetes mellitus with hyperglycemia ([place])    Normal coronary arteries 04/17/2016   OSA on CPAP 10/28/2014   Major depressive disorder 56/43/3295   Chronic systolic CHF (congestive heart failure), NYHA class 3 ([place]) 12/06/2013   Hyperlipidemia associated with type 2 diabetes mellitus ([place]), on Zocor 04/04/2011   Hypertension associated with diabetes ([place]) 06/03/2008   Morbid obesity with BMI of 50.0-59.9, adult ([place]) 07/12/2006   NICM (nonischemic cardiomyopathy) ([place]) 07/12/2006   Attention deficit disorder 07/12/2006  PCP: [name], MD  REFERRING PROVIDER: Lyndal Pulley, DO  REFERRING DIAG: [phone] (ICD-10-CM) - Chronic bilateral low back pain without sciatica  THERAPY DIAG:  M54.50,G89.29 (ICD-10-CM) - Chronic bilateral low back pain without sciatica  ONSET DATE: Several years with progression of pain over the past few months   SUBJECTIVE:                                                                                                                                                                                            SUBJECTIVE STATEMENT: Patient has a long history of low back pain. The pain has increased over the past few months. She has gotten to the point where she can not stand for more then 30 seconds without pain. Her gait distance is limited. She can not walk in the store. She is motivated to loose weight an improve her over all fitness.  Three days ago, she had an insidious onset of right knee pain. She has had right knee pain in the past.   PERTINENT HISTORY:  DMII, cardiomyopathy, Morbid obesity, Arthritis bilateral knees, mild paresthesias in her knees from her 20's   PAIN:  Are you having pain? Yes VAS scale: 8/10 standing sitting no pain at all Pain location: Across lower back  Pain orientation: Right and Left  PAIN TYPE: aching Pain description: constant when standing and walking g Aggravating factors: standing and walking  Relieving factors: rest;   PRECAUTIONS: None  WEIGHT BEARING RESTRICTIONS No  FALLS:  Has patient fallen in last 6 months? No, Number of falls: None   LIVING ENVIRONMENT: Pain going up and down stairs.  OCCUPATION:    PLOF: Independent  PATIENT GOALS   To be able to stand, walk, and participate in activity    OBJECTIVE:   DIAGNOSTIC FINDINGS:  MRI: 6/6 2022   IMPRESSION: 1. Multilevel degenerative changes of the lumbar spine, most pronounced at L5-S1 where there is moderate left foraminal stenosis and mild left subarticular recess stenosis. 2. Mild right foraminal stenosis at L3-4 and mild bilateral foraminal stenosis at L4-5. 3. No significant canal stenosis at any level. 4. Fibroid uterus, incompletely evaluated.  PATIENT SURVEYS:  FOTO 36% intake  41% expected in 14 visits   SCREENING FOR RED FLAGS: Bowel or bladder incontinence: No Spinal tumors: No Cauda equina syndrome: No Compression fracture: No Abdominal aneurysm: No  COGNITION:  Overall cognitive status: Within  functional limits for tasks assessed     SENSATION:  Light touch: Appears intact  Stereognosis: Appears intact  Hot/Cold: Appears intact  Proprioception: Appears intact Pain can radiate down both legs  POSTURE:  Morbid obesity; rounded shoulders and forward head   PALPATION: Mild spasming in lower back and upper gluteals  LUMBARAROM/PROM  A/PROM A/PROM  02/28/2021  Flexion 90 without significant pain   Extension Full without pain   Right lateral flexion   Left lateral flexion   Right rotation Full with pain at end range  Left rotation Full with pain at end range    (Blank rows = not tested)    MMT Right 02/28/2021 Left 02/28/2021  Hip flexion 17.1 18.3  Hip extension    Hip abduction 31 29.1  Hip adduction    Hip internal rotation    Hip external rotation    Knee flexion    Knee extension Not measured 2nd to acute knee pain  24.2  Ankle dorsiflexion    Ankle plantarflexion    Ankle inversion    Ankle eversion     (Blank rows = not tested)  AROM of the hips not measure. Patient not put into supine.      FUNCTIONAL TESTS:  Slow transfer from sit to stand  GAIT: Limited hip rotation; limited hip flexion; significant lateral movement bilateral;    TODAY'S TREATMENT  Access Code: [email or web address] URL: [email or web address] Date: 02/28/2021 Prepared by: Carolyne Littles  Exercises Seated Hamstring Stretch - 2 x daily - 7 x weekly - 3 sets - 3 reps - 20 hold Seated Hip Abduction with Resistance - 2 x daily - 7 x weekly - 3 sets - 10 reps Shoulder External Rotation and Scapular Retraction with Resistance - 2 x daily - 7 x weekly - 3 sets - 10 reps    PATIENT EDUCATION:  Education details: reviewed importance of strengthening and progression of activity; POC, symptom management  Person educated: Patient Education method: Explanation, Demonstration, Tactile cues, Verbal cues, and Handouts Education comprehension: verbalized understanding, returned  demonstration, verbal cues required, tactile cues required, and needs further education   HOME EXERCISE PROGRAM: Access Code: [email or web address] URL: [email or web address] Date: 02/28/2021 Prepared by: Carolyne Littles  Exercises Seated Hamstring Stretch - 2 x daily - 7 x weekly - 3 sets - 3 reps - 20 hold Seated Hip Abduction with Resistance - 2 x daily - 7 x weekly - 3 sets - 10 reps Shoulder External Rotation and Scapular Retraction with Resistance - 2 x daily - 7 x weekly - 3 sets - 10 reps   ASSESSMENT:  CLINICAL IMPRESSION: Patient is a 56 year old female with significant lower back pain when she stands and walks. She has had a progressive loss in mobility over the past few months. She has weakness in bilateral LE and her core. She has good flexion mobility in her trunk. She would benefit from skilled aquatic and land therapy to improve ability to perform ADL's and IADL's    Objective impairments include Abnormal gait, cardiopulmonary status limiting activity, decreased activity tolerance, decreased endurance, decreased mobility, difficulty walking, decreased ROM, decreased strength, increased fascial restrictions, impaired sensation, impaired UE functional use, improper body mechanics, postural dysfunction, obesity, and pain. These impairments are limiting patient from cleaning, community activity, driving, meal prep, laundry, yard work, and shopping. Personal factors including 3+ comorbidities: morbid obesity; bilateral knee OA cardiomyopathy   are also affecting patient's functional outcome. Patient will benefit from skilled PT to address above impairments and improve overall function.  REHAB POTENTIAL: Good  CLINICAL DECISION MAKING: Evolving/moderate complexity declining mobility   EVALUATION COMPLEXITY: Moderate   GOALS: Goals reviewed with patient? No  SHORT TERM GOALS:  STG  Name Target Date Goal status  1 Patient will increase gross bilateral LE strength by 5 pounds   Baseline:  03/21/2021 INITIAL  2 Patient will be independent with basic HEP  Baseline:  03/21/2021 INITIAL  3 Patient will stand for 2-3 minutes with <4/10 pain  Baseline: 03/21/2021 INITIAL  LONG TERM GOALS:   LTG Name Target Date Goal status  1 Patient will stand for 10 minutes without increased pain  Baseline: 04/11/2021 INITIAL  2 Patient will ambulate 1000' with <3/10 pain in order to perform ADL's  Baseline: 04/11/2021 INITIAL  3 Patient will be independent with complete HEP for water and land in order to progress mobility.  Baseline: 04/11/2021 INITIAL  PLAN: PT FREQUENCY: 2x/week  PT DURATION: 6 weeks  PLANNED INTERVENTIONS: Therapeutic exercises, Therapeutic activity, Neuro Muscular re-education, Gait training, Patient/Family education, Joint mobilization, Stair training, Aquatic Therapy, Dry Needling, Cryotherapy, Moist heat, Taping, Ultrasound, and Manual therapy  PLAN FOR NEXT SESSION: begin basic stretching, core strengthening, and LE strengthening in the water.    Carney Living PT DPT  02/28/2021, 4:50 PM

## 2021-03-01 ENCOUNTER — Ambulatory Visit (INDEPENDENT_AMBULATORY_CARE_PROVIDER_SITE_OTHER): Payer: Medicare Other | Admitting: Internal Medicine

## 2021-03-01 ENCOUNTER — Other Ambulatory Visit: Payer: Self-pay

## 2021-03-01 ENCOUNTER — Encounter: Payer: Self-pay | Admitting: Internal Medicine

## 2021-03-01 VITALS — BP 120/78 | HR 76 | Temp 98.7°F | Ht 64.0 in | Wt 319.0 lb

## 2021-03-01 DIAGNOSIS — I428 Other cardiomyopathies: Secondary | ICD-10-CM

## 2021-03-01 DIAGNOSIS — F332 Major depressive disorder, recurrent severe without psychotic features: Secondary | ICD-10-CM | POA: Diagnosis not present

## 2021-03-01 DIAGNOSIS — Z1231 Encounter for screening mammogram for malignant neoplasm of breast: Secondary | ICD-10-CM

## 2021-03-01 DIAGNOSIS — Z23 Encounter for immunization: Secondary | ICD-10-CM | POA: Diagnosis not present

## 2021-03-01 DIAGNOSIS — I152 Hypertension secondary to endocrine disorders: Secondary | ICD-10-CM | POA: Diagnosis not present

## 2021-03-01 DIAGNOSIS — E785 Hyperlipidemia, unspecified: Secondary | ICD-10-CM | POA: Diagnosis not present

## 2021-03-01 DIAGNOSIS — E1159 Type 2 diabetes mellitus with other circulatory complications: Secondary | ICD-10-CM | POA: Diagnosis not present

## 2021-03-01 DIAGNOSIS — E1165 Type 2 diabetes mellitus with hyperglycemia: Secondary | ICD-10-CM

## 2021-03-01 DIAGNOSIS — Z124 Encounter for screening for malignant neoplasm of cervix: Secondary | ICD-10-CM

## 2021-03-01 LAB — LIPID PANEL
Cholesterol: 176 mg/dL (ref 0–200)
HDL: 67.6 mg/dL (ref >39.00)
LDL Cholesterol: 86 mg/dL (ref 0–99)
NonHDL: 108.49
Total CHOL/HDL Ratio: 3
Triglycerides: 110 mg/dL (ref 0.0–149.0)
VLDL: 22 mg/dL (ref 0.0–40.0)

## 2021-03-01 LAB — MICROALBUMIN / CREATININE URINE RATIO
Creatinine,U: 102.8 mg/dL
Microalb Creat Ratio: 1.6 mg/g (ref 0.0–30.0)
Microalb, Ur: 1.6 mg/dL (ref 0.0–1.9)

## 2021-03-01 LAB — HEPATIC FUNCTION PANEL
ALT: 15 U/L (ref 0–35)
AST: 13 U/L (ref 0–37)
Albumin: 3.9 g/dL (ref 3.5–5.2)
Alkaline Phosphatase: 44 U/L (ref 39–117)
Bilirubin, Direct: 0.2 mg/dL (ref 0.0–0.3)
Total Bilirubin: 0.8 mg/dL (ref 0.2–1.2)
Total Protein: 7.6 g/dL (ref 6.0–8.3)

## 2021-03-01 LAB — HEMOGLOBIN A1C: Hgb A1c MFr Bld: 6.9 % — ABNORMAL HIGH (ref 4.6–6.5)

## 2021-03-01 MED ORDER — SHINGRIX 50 MCG/0.5ML IM SUSR
0.5000 mL | Freq: Once | INTRAMUSCULAR | 1 refills | Status: AC
Start: 2021-03-01 — End: 2021-03-01

## 2021-03-01 NOTE — Progress Notes (Signed)
Subjective:  Patient ID: [name], female    DOB: [birth year]  Age: 56 y.o. MRN: [id number]  CC: Diabetes and Hyperlipidemia  This visit occurred during the SARS-CoV-2 public health emergency.  Safety protocols were in place, including screening questions prior to the visit, additional usage of staff PPE, and extensive cleaning of exam room while observing appropriate contact time as indicated for disinfecting solutions.    HPI [name] presents for f/up -   She wants to see a new psychiatrist.  She continues to struggle with depression, anxiety, feeling helpless/hopeless, and apathy.  She denies SI or HI.  She admits to noncompliance with many of her meds.  She is not very active because she has chronic musculoskeletal pain.  When she is active she denies chest pain, shortness of breath, diaphoresis, or edema.  Outpatient Medications Prior to Visit  Medication Sig Dispense Refill   ARIPiprazole (ABILIFY) 2 MG tablet Take 1 tablet (2 mg total) by mouth daily. 30 tablet 1   busPIRone (BUSPAR) 15 MG tablet Take 1 tablet (15 mg total) by mouth 2 (two) times daily. 60 tablet 1   carvedilol (COREG) 12.5 MG tablet Take 1 tablet (12.5 mg total) by mouth 2 (two) times daily with a meal. 180 tablet 1   carvedilol (COREG) 6.25 MG tablet Take 6.25 mg by mouth 2 (two) times daily with a meal.     ciclopirox (LOPROX) 0.77 % cream Apply topically 2 (two) times daily. 90 g 1   clotrimazole-betamethasone (LOTRISONE) cream APPLY TO AFFECTED AREA TWICE A DAY 45 g 2   fluticasone (FLONASE) 50 MCG/ACT nasal spray Place 2 sprays into both nostrils daily. 48 g 1   furosemide (LASIX) 80 MG tablet Take 1 tablet (80 mg total) by mouth 2 (two) times daily. 60 tablet 11   linaclotide (LINZESS) 145 MCG CAPS capsule Take 1 capsule (145 mcg total) by mouth daily before breakfast. 90 capsule 1   potassium chloride SA (KLOR-CON M20) 20 MEQ tablet Take 2 tablets (40 mEq total) by mouth daily. 180 tablet 3    sacubitril-valsartan (ENTRESTO) 97-103 MG Take 1 tablet by mouth 2 (two) times daily. 60 tablet 3   sertraline (ZOLOFT) 100 MG tablet Take 1.5 tablets (150 mg total) by mouth daily. 45 tablet 1   traMADol (ULTRAM) 50 MG tablet Take 1 tablet (50 mg total) by mouth every 12 (twelve) hours as needed. 60 tablet 5   Dapagliflozin-metFORMIN HCl ER (XIGDUO XR) 11-998 MG TB24 Take 1 tablet by mouth daily. 90 tablet 3   diazepam (VALIUM) 10 MG tablet Take 10 mg by mouth every morning.     LORazepam (ATIVAN) 0.5 MG tablet Take 1 tablet (0.5 mg total) by mouth 2 (two) times daily as needed for anxiety. 20 tablet 1   simvastatin (ZOCOR) 20 MG tablet TAKE 1 TABLET BY MOUTH EVERY DAY 90 tablet 2   No facility-administered medications prior to visit.    ROS Review of Systems  Constitutional:  Positive for fatigue. Negative for appetite change, chills, diaphoresis and unexpected weight change.  HENT: Negative.    Eyes:  Negative for visual disturbance.  Respiratory:  Negative for cough, chest tightness, shortness of breath and wheezing.   Cardiovascular:  Negative for chest pain, palpitations and leg swelling.  Gastrointestinal:  Negative for abdominal pain, diarrhea, nausea and vomiting.  Endocrine: Negative.   Genitourinary: Negative.  Negative for difficulty urinating and dysuria.  Musculoskeletal:  Positive for arthralgias and back pain. Negative for myalgias  and neck pain.  Skin: Negative.   Neurological: Negative.  Negative for dizziness and weakness.  Hematological:  Negative for adenopathy. Does not bruise/bleed easily.  Psychiatric/Behavioral:  Positive for decreased concentration, dysphoric mood and sleep disturbance. Negative for agitation, behavioral problems, confusion, hallucinations and suicidal ideas. The patient is nervous/anxious. The patient is not hyperactive.    Objective:  BP 120/78 (BP Location: Left Arm, Patient Position: Sitting, Cuff Size: Large)    Pulse 76    Temp 98.7 F  (37.1 C) (Oral)    Ht 5\' 4"  (1.626 m)    Wt (!) 319 lb (144.7 kg)    SpO2 97%    BMI 54.76 kg/m   BP Readings from Last 3 Encounters:  03/01/21 120/78  02/20/21 133/75  02/12/21 122/76    Wt Readings from Last 3 Encounters:  03/01/21 (!) 319 lb (144.7 kg)  02/12/21 (!) 323 lb (146.5 kg)  12/29/20 (!) 333 lb 6.4 oz (151.2 kg)    Physical Exam Vitals reviewed.  Constitutional:      Appearance: She is obese.  HENT:     Nose: Nose normal.     Mouth/Throat:     Mouth: Mucous membranes are moist.  Eyes:     General: No scleral icterus.    Conjunctiva/sclera: Conjunctivae normal.  Cardiovascular:     Rate and Rhythm: Normal rate and regular rhythm.     Heart sounds: No murmur heard. Pulmonary:     Effort: Pulmonary effort is normal.     Breath sounds: No stridor. No wheezing, rhonchi or rales.  Abdominal:     General: Abdomen is protuberant. Bowel sounds are normal.     Palpations: Abdomen is soft. There is no hepatomegaly, splenomegaly or mass.     Tenderness: There is no abdominal tenderness.  Musculoskeletal:        General: No swelling.     Cervical back: Neck supple.     Right lower leg: No edema.     Left lower leg: No edema.  Lymphadenopathy:     Cervical: No cervical adenopathy.  Skin:    General: Skin is warm.  Neurological:     General: No focal deficit present.     Mental Status: She is alert.  Psychiatric:        Attention and Perception: She is inattentive.        Mood and Affect: Mood is anxious and depressed. Affect is angry and tearful. Affect is not labile or flat.        Speech: Speech is tangential. Speech is not rapid and pressured, delayed or slurred.        Behavior: Behavior normal. Behavior is not agitated, slowed, aggressive or hyperactive. Behavior is cooperative.        Thought Content: Thought content is not paranoid or delusional. Thought content does not include homicidal or suicidal ideation.        Cognition and Memory: Cognition normal.     Lab Results  Component Value Date   WBC 10.2 01/08/2021   HGB 15.9 01/08/2021   HCT 48.5 (H) 01/08/2021   PLT 275 01/08/2021   GLUCOSE 109 (H) 01/08/2021   CHOL 176 03/01/2021   TRIG 110.0 03/01/2021   HDL 67.60 03/01/2021   LDLDIRECT 143.8 04/03/2011   LDLCALC 86 03/01/2021   ALT 15 03/01/2021   AST 13 03/01/2021   NA 145 (H) 01/08/2021   K 4.1 01/08/2021   CL 100 01/08/2021   CREATININE 0.79 01/08/2021   BUN  17 01/08/2021   CO2 27 01/08/2021   TSH 4.260 06/15/2020   INR 1.0 04/08/2016   HGBA1C 6.9 (H) 03/01/2021   MICROALBUR 1.6 03/01/2021    DG INJECT DIAG/THERA/INC NEEDLE/CATH/PLC EPI/LUMB/SAC W/IMG  Result Date: 02/20/2021 CLINICAL DATA:  Low back pain extending sacrum. Pain is in the midline. Bilateral leg numbness. FLUOROSCOPY TIME:  Radiation Exposure Index (as provided by the fluoroscopic device): Dose area product 5.25 uGy*m2 PROCEDURE: The procedure, risks, benefits, and alternatives were explained to the patient. Questions regarding the procedure were encouraged and answered. The patient understands and consents to the procedure. LUMBAR EPIDURAL INJECTION: An interlaminar approach was performed on right at L4-5. The overlying skin was cleansed and anesthetized. A 20 gauge epidural needle was advanced using loss-of-resistance technique. DIAGNOSTIC EPIDURAL INJECTION: Injection of Isovue-M 200 shows a good epidural pattern with spread above and below the level of needle placement. Contrast is seen bilaterally. No vascular opacification is seen. THERAPEUTIC EPIDURAL INJECTION: 80 mg of Depo-Medrol mixed with 1.5 mL 1 percent lidocaine were instilled. The procedure was well-tolerated, and the patient was discharged thirty minutes following the injection in good condition. COMPLICATIONS: None IMPRESSION: Technically successful epidural injection on the right L4-5 # 1 Electronically Signed   By: San Morelle M.D.   On: 02/20/2021 08:54    Assessment & Plan:    [name] was seen today for diabetes and hyperlipidemia.  Diagnoses and all orders for this visit:  Type 2 diabetes mellitus with hyperglycemia, without long-term current use of insulin (HCC)-her blood sugar is adequately well controlled.  We will continue the combination of metformin and an SGLT2 inhibitor. -     Microalbumin / creatinine urine ratio; Future -     Hemoglobin A1c; Future -     Hepatic function panel; Future -     HM Diabetes Foot Exam -     Ambulatory referral to Ophthalmology -     Hepatic function panel -     Hemoglobin A1c -     Microalbumin / creatinine urine ratio -     Dapagliflozin-metFORMIN HCl ER (XIGDUO XR) 11-998 MG TB24; Take 1 tablet by mouth daily. -     Consult to [facility]  Hyperlipidemia LDL goal <100- LDL goal achieved. Doing well on the statin   -     Lipid panel; Future -     Hepatic function panel; Future -     Hepatic function panel -     Lipid panel -     simvastatin (ZOCOR) 20 MG tablet; Take 1 tablet (20 mg total) by mouth daily.  Hypertension associated with diabetes ([place])- Her BP is well controlled. -     Urinalysis, Routine w reflex microscopic; Future -     Urinalysis, Routine w reflex microscopic  Visit for screening mammogram -     MM DIGITAL SCREENING BILATERAL; Future  Screening for cervical cancer -     Ambulatory referral to Gynecology  Need for shingles vaccine -     Zoster Vaccine Adjuvanted [facility]) injection; Inject 0.5 mLs into the muscle once for 1 dose.  Need for influenza vaccination -     Flu Vaccine QUAD 6+ mos PF IM (Fluarix Quad PF)  Need for pneumococcal vaccination -     Pneumococcal conjugate vaccine 20-valent (Prevnar 20)  Severe episode of recurrent major depressive disorder, without psychotic features ([place]) -     Ambulatory referral to Psychiatry  NICM (nonischemic cardiomyopathy) ([place]) -     Dapagliflozin-metFORMIN HCl  ER (XIGDUO XR) 11-998 MG TB24; Take 1 tablet by mouth daily. -      Consult to [facility] -     Ambulatory referral to Cardiology   I have discontinued [name]'s LORazepam and diazepam. I have also changed her simvastatin. Additionally, I am having her start on Shingrix. Lastly, I am having her maintain her carvedilol, clotrimazole-betamethasone, furosemide, potassium chloride SA, traMADol, ciclopirox, fluticasone, linaclotide, ARIPiprazole, busPIRone, sertraline, sacubitril-valsartan, carvedilol, and Xigduo XR.  Meds ordered this encounter  Medications   Zoster Vaccine Adjuvanted [facility]) injection    Sig: Inject 0.5 mLs into the muscle once for 1 dose.    Dispense:  0.5 mL    Refill:  1   Dapagliflozin-metFORMIN HCl ER (XIGDUO XR) 11-998 MG TB24    Sig: Take 1 tablet by mouth daily.    Dispense:  90 tablet    Refill:  1   simvastatin (ZOCOR) 20 MG tablet    Sig: Take 1 tablet (20 mg total) by mouth daily.    Dispense:  90 tablet    Refill:  1     Follow-up: Return in about 6 months (around 08/29/2021).  [name], MD

## 2021-03-01 NOTE — Patient Instructions (Addendum)
[facility] [phone]    Type 2 Diabetes Mellitus, Diagnosis, Adult Type 2 diabetes (type 2 diabetes mellitus) is a long-term, or chronic, disease. In type 2 diabetes, one or both of these problems may be present: The pancreas does not make enough of a hormone called insulin. Cells in the body do not respond properly to the insulin that the body makes (insulin resistance). Normally, insulin allows blood sugar (glucose) to enter cells in the body. The cells use glucose for energy. Insulin resistance or lack of insulin causes excess glucose to build up in the blood instead of going into cells. This causes high blood glucose (hyperglycemia).  What are the causes? The exact cause of type 2 diabetes is not known. What increases the risk? The following factors may make you more likely to develop this condition: Having a family member with type 2 diabetes. Being overweight or obese. Being inactive (sedentary). Having been diagnosed with insulin resistance. Having a history of prediabetes, diabetes when you were pregnant (gestational diabetes), or polycystic ovary syndrome (PCOS). What are the signs or symptoms? In the early stage of this condition, you may not have symptoms. Symptoms develop slowly and may include: Increased thirst or hunger. Increased urination. Unexplained weight loss. Tiredness (fatigue) or weakness. Vision changes, such as blurry vision. Dark patches on the skin. How is this diagnosed? This condition is diagnosed based on your symptoms, your medical history, a physical exam, and your blood glucose level. Your blood glucose may be checked with one or more of the following blood tests: A fasting blood glucose (FBG) test. You will not be allowed to eat (you will fast) for 8 hours or longer before a blood sample is taken. A random blood glucose test. This test checks blood glucose at any time of day regardless of when you ate. An A1C (hemoglobin A1C) blood  test. This test provides information about blood glucose levels over the previous 2-3 months. An oral glucose tolerance test (OGTT). This test measures your blood glucose at two times: After fasting. This is your baseline blood glucose level. Two hours after drinking a beverage that contains glucose. You may be diagnosed with type 2 diabetes if: Your fasting blood glucose level is 126 mg/dL (7.0 mmol/L) or higher. Your random blood glucose level is 200 mg/dL (11.1 mmol/L) or higher. Your A1C level is 6.5% or higher. Your oral glucose tolerance test result is higher than 200 mg/dL (11.1 mmol/L). These blood tests may be repeated to confirm your diagnosis. How is this treated? Your treatment may be managed by a specialist called an endocrinologist. Type 2 diabetes may be treated by following instructions from your health care provider about: Making dietary and lifestyle changes. These may include: Following a personalized nutrition plan that is developed by a registered dietitian. Exercising regularly. Finding ways to manage stress. Checking your blood glucose level as often as told. Taking diabetes medicines or insulin daily. This helps to keep your blood glucose levels in the healthy range. Taking medicines to help prevent complications from diabetes. Medicines may include: Aspirin. Medicine to lower cholesterol. Medicine to control blood pressure. Your health care provider will set treatment goals for you. Your goals will be based on your age, other medical conditions you have, and how you respond to diabetes treatment. Generally, the goal of treatment is to maintain the following blood glucose levels: Before meals: 80-130 mg/dL (4.4-7.2 mmol/L). After meals: below 180 mg/dL (10 mmol/L). A1C level: less than 7%. Follow these instructions at  home: Questions to ask your health care provider Consider asking the following questions: Should I meet with a certified diabetes care and education  specialist? What diabetes medicines do I need, and when should I take them? What equipment will I need to manage my diabetes at home? How often do I need to check my blood glucose? Where can I find a support group for people with diabetes? What number can I call if I have questions? When is my next appointment? General instructions Take over-the-counter and prescription medicines only as told by your health care provider. Keep all follow-up visits. This is important. Where to find more information For help and guidance and for more information about diabetes, please visit: American Diabetes Association (ADA): www.diabetes.org American Association of Diabetes Care and Education Specialists (ADCES): www.diabeteseducator.org International Diabetes Federation (IDF): MemberVerification.ca Contact a health care provider if: Your blood glucose is at or above 240 mg/dL (13.3 mmol/L) for 2 days in a row. You have been sick or have had a fever for 2 days or longer, and you are not getting better. You have any of the following problems for more than 6 hours: You cannot eat or drink. You have nausea and vomiting. You have diarrhea. Get help right away if: You have severe hypoglycemia. This means your blood glucose is lower than 54 mg/dL (3.0 mmol/L). You become confused or you have trouble thinking clearly. You have difficulty breathing. You have moderate or large ketone levels in your urine. These symptoms may represent a serious problem that is an emergency. Do not wait to see if the symptoms will go away. Get medical help right away. Call your local emergency services (911 in the [place] Do not drive yourself to the hospital. Summary Type 2 diabetes mellitus is a long-term, or chronic, disease. In type 2 diabetes, the pancreas does not make enough of a hormone called insulin, or cells in the body do not respond properly to insulin that the body makes. This condition is treated by making dietary and  lifestyle changes and taking diabetes medicines or insulin. Your health care provider will set treatment goals for you. Your goals will be based on your age, other medical conditions you have, and how you respond to diabetes treatment. Keep all follow-up visits. This is important. This information is not intended to replace advice given to you by your health care provider. Make sure you discuss any questions you have with your health care provider. Document Revised: 05/08/2020 Document Reviewed: 05/08/2020 Elsevier Patient Education  [place].

## 2021-03-02 ENCOUNTER — Telehealth: Payer: Self-pay | Admitting: *Deleted

## 2021-03-02 ENCOUNTER — Other Ambulatory Visit: Payer: Self-pay

## 2021-03-02 DIAGNOSIS — I119 Hypertensive heart disease without heart failure: Secondary | ICD-10-CM

## 2021-03-02 LAB — URINALYSIS, ROUTINE W REFLEX MICROSCOPIC
Bilirubin Urine: NEGATIVE
Hgb urine dipstick: NEGATIVE
Ketones, ur: NEGATIVE
Leukocytes,Ua: NEGATIVE
Nitrite: NEGATIVE
RBC / HPF: NONE SEEN (ref 0–?)
Specific Gravity, Urine: 1.025 (ref 1.000–1.030)
Total Protein, Urine: NEGATIVE
Urine Glucose: 500 — AB
Urobilinogen, UA: 0.2 (ref 0.0–1.0)
pH: 5.5 (ref 5.0–8.0)

## 2021-03-02 MED ORDER — SIMVASTATIN 20 MG PO TABS: 20.0000 mg | ORAL_TABLET | Freq: Every day | ORAL | 1 refills | Status: DC

## 2021-03-02 MED ORDER — XIGDUO XR 10-1000 MG PO TB24: 1.0000 | ORAL_TABLET | Freq: Every day | ORAL | 1 refills | Status: DC

## 2021-03-02 NOTE — Patient Outreach (Signed)
[facility]) Care Management  03/02/2021  Alvah Gilder 1965-12-09 [id number]  Referral received from [name], MD for care management services to assess for heart failure and diabetes.  Assigned to embedded care coordinator Reginia Naas, RN through [facility] 2300 referral queue for follow up.

## 2021-03-02 NOTE — Chronic Care Management (AMB) (Signed)
Chronic Care Management   Note  03/02/2021 Name: [name] MRN: [id number] DOB: 1965/05/15  [name] is a 56 y.o. year old female who is a primary care patient of [name], MD. I reached out to [name] by phone today in response to a referral sent by [name] PCP.  [name] was given information about Chronic Care Management services today including:  CCM service includes personalized support from designated clinical staff supervised by her physician, including individualized plan of care and coordination with other care providers 24/7 contact phone numbers for assistance for urgent and routine care needs. Service will only be billed when office clinical staff spend 20 minutes or more in a month to coordinate care. Only one practitioner may furnish and bill the service in a calendar month. The patient may stop CCM services at any time (effective at the end of the month) by phone call to the office staff. The patient is responsible for co-pay (up to 20% after annual deductible is met) if co-pay is required by the individual health plan.   Patient agreed to services and verbal consent obtained.   Follow up plan: Telephone appointment with care management team member scheduled for:03/13/21  [facility]  Direct Dial: [phone]

## 2021-03-08 ENCOUNTER — Other Ambulatory Visit: Payer: Self-pay

## 2021-03-08 MED ORDER — SACUBITRIL-VALSARTAN 97-103 MG PO TABS: 1.0000 | ORAL_TABLET | Freq: Two times a day (BID) | ORAL | 3 refills | Status: DC

## 2021-03-13 ENCOUNTER — Ambulatory Visit (INDEPENDENT_AMBULATORY_CARE_PROVIDER_SITE_OTHER): Payer: Medicare Other | Admitting: *Deleted

## 2021-03-13 DIAGNOSIS — F3341 Major depressive disorder, recurrent, in partial remission: Secondary | ICD-10-CM

## 2021-03-13 DIAGNOSIS — I5022 Chronic systolic (congestive) heart failure: Secondary | ICD-10-CM

## 2021-03-13 DIAGNOSIS — E1165 Type 2 diabetes mellitus with hyperglycemia: Secondary | ICD-10-CM

## 2021-03-13 NOTE — Chronic Care Management (AMB) (Signed)
Chronic Care Management   CCM RN Visit Note  03/13/2021 Name: [name] MRN: [id number] DOB: 02/17/1966  Subjective: [name] is a 56 y.o. year old female who is a primary care patient of Samantha Lima, MD. The care management team was consulted for assistance with disease management and care coordination needs.    Engaged with patient by telephone for initial visit in response to provider referral for case management and/or care coordination services.   Consent to Services:  The patient was given information about Chronic Care Management services, agreed to services, and gave verbal consent 03/02/21 prior to initiation of services.  Please see initial visit note for detailed documentation.  Patient agreed to services and verbal consent obtained.   Assessment: Review of patient past medical history, allergies, medications, health status, including review of consultants reports, laboratory and other test data, was performed as part of comprehensive evaluation and provision of chronic care management services.   SDOH (Social Determinants of Health) assessments and interventions performed:  SDOH Interventions    Flowsheet Row Most Recent Value  SDOH Interventions   Food Insecurity Interventions Other (Comment)  [Patient confirms she gets food stamps,  referral made to [facility] team for fresh food pantries]  Housing Interventions Intervention Not Indicated  [lives in 2 level condo with husband and adult son,  has lived there for 19 years,  reports in too much pain to go upstairs to bedroom at night- sleeps downstairs]  Physical Activity Interventions Patient Refused  Transportation Interventions Intervention Not Indicated  [Husband provides transportation]  Depression Interventions/Treatment  Referral to Psychiatry  [psychiatry referral placed by PCP 03/01/21,  CCM CSW referral placed today]      CCM Care Plan Allergies  Allergen Reactions   Fluoxetine Other  (See Comments)    More depressed   Cymbalta [Duloxetine Hcl] Other (See Comments)    depressed   Penicillins     REACTION: rash Has patient had a PCN reaction causing immediate rash, facial/tongue/throat swelling, SOB or lightheadedness with hypotension: YES Has patient had a PCN reaction causing severe rash involving mucus membranes or skin necrosis: NO Has patient had a PCN reaction that required hospitalization: YES Has patient had a PCN reaction occurring within the last 10 years: NO If all of the above answers are "NO", then may proceed with Cephalosporin use.    Outpatient Encounter Medications as of 03/13/2021  Medication Sig   ARIPiprazole (ABILIFY) 2 MG tablet Take 1 tablet (2 mg total) by mouth daily. (Patient not taking: Reported on 03/13/2021)   busPIRone (BUSPAR) 15 MG tablet Take 1 tablet (15 mg total) by mouth 2 (two) times daily.   carvedilol (COREG) 12.5 MG tablet Take 1 tablet (12.5 mg total) by mouth 2 (two) times daily with a meal.   carvedilol (COREG) 6.25 MG tablet Take 6.25 mg by mouth 2 (two) times daily with a meal.   ciclopirox (LOPROX) 0.77 % cream Apply topically 2 (two) times daily.   clotrimazole-betamethasone (LOTRISONE) cream APPLY TO AFFECTED AREA TWICE A DAY   Dapagliflozin-metFORMIN HCl ER (XIGDUO XR) 11-998 MG TB24 Take 1 tablet by mouth daily.   fluticasone (FLONASE) 50 MCG/ACT nasal spray Place 2 sprays into both nostrils daily.   furosemide (LASIX) 80 MG tablet Take 1 tablet (80 mg total) by mouth 2 (two) times daily.   linaclotide (LINZESS) 145 MCG CAPS capsule Take 1 capsule (145 mcg total) by mouth daily before breakfast.   potassium chloride SA (KLOR-CON M20)  20 MEQ tablet Take 2 tablets (40 mEq total) by mouth daily.   sacubitril-valsartan (ENTRESTO) 97-103 MG Take 1 tablet by mouth 2 (two) times daily.   sertraline (ZOLOFT) 100 MG tablet Take 1.5 tablets (150 mg total) by mouth daily. (Patient not taking: Reported on 03/13/2021)   simvastatin  (ZOCOR) 20 MG tablet Take 1 tablet (20 mg total) by mouth daily.   traMADol (ULTRAM) 50 MG tablet Take 1 tablet (50 mg total) by mouth every 12 (twelve) hours as needed.   No facility-administered encounter medications on file as of 03/13/2021.   Patient Active Problem List   Diagnosis Date Noted   Hyperlipidemia LDL goal <100 03/01/2021   DDD (degenerative disc disease), lumbar 08/08/2020   Non-seasonal allergic rhinitis due to pollen 07/25/2020   Chronic idiopathic constipation 07/25/2020   Visit for screening mammogram 02/11/2020   Chronic bilateral low back pain without sciatica 02/10/2020   Spondylosis without myelopathy or radiculopathy, lumbar region 12/04/2017   Type 2 diabetes mellitus with hyperglycemia (HCC)    Normal coronary arteries 04/17/2016   OSA on CPAP 10/28/2014   Major depressive disorder 75/64/3329   Chronic systolic CHF (congestive heart failure), NYHA class 3 ([place]) 12/06/2013   Hyperlipidemia associated with type 2 diabetes mellitus ([place]), on Zocor 04/04/2011   Abnormal electrocardiography 01/16/2009   Hypertension associated with diabetes ([place]) 06/03/2008   Morbid obesity with BMI of 50.0-59.9, adult ([place]) 07/12/2006   NICM (nonischemic cardiomyopathy) ([place]) 07/12/2006   Attention deficit disorder 07/12/2006   Migraine without status migrainosus, not intractable 07/12/2006   Conditions to be addressed/monitored:  CHF, DMII, and Depression  Care Plan : RN Care Manager Plan of Care  Updates made by Samantha Royalty, RN since 03/13/2021 12:00 AM     Problem: Chronic Disease Management Needs   Priority: High     Long-Range Goal: Development of Plan of Care for long term chronic disease management   Start Date: 03/13/2021  Expected End Date: 03/13/2022  Priority: High  Note:   Current Barriers:  Chronic Disease Management support and education needs related to CHF, DMII, and severe depression Chronic back pain: reports "9/10" chronic back on daily basis:  states ongoing pain prevents her from going upstairs to sleep in bedroom, she is currently sleeping downstairs; reports chronic pain makes her unable to do iADL's and her husband is essentially her caregiver for both ADL's and iADL's; she declines referral for pain management provider; states she rarely takes medications for pain; patient also reports that in the past PCP has referred her to outpatient rehabilitation/ PT, however, she never followed up because she is "so immobilized by depression and pain" that she could "never bring herself" to call and schedule with rehabilitation team-- she is currently not interested in another outpatient rehabilitation referral Ongoing depression- reports long-standing history depression in her family and herself; reports she had psychiatric provider that she "liked" very much, however, she did not feel that she was making progress in therapy with previous psychiatric provider: states she did not believe the medications provider prescribed was helping, and "all he wanted to do was to increase the doses;" today, 03/13/21- she confirms that she is not under psychiatric care and that she is NOT taking prescribed medications for depression Self- acknowledges lack of motivation around state of health: she attributes this to both her ongoing sever depression and her chronic back pain Need for resources for affordable dental services and for food acquisition for FRESH food only: has tried other food  pantries, but "they are all unhealthy food choices/ canned" food, which patient is not interested in: [facility] referral placed Need for DME: reports she would like a rollator walker, "for a large person:" states she asked PCP about this at time of PCP office visit and has not yet heard anything; I will reach out to PCP to re-request on patient's behalf  RNCM Clinical Goal(s):  Patient will demonstrate improved health management independence as evidenced by adherence  to plan of care for depression, CHF, and DMII          through collaboration with RN Care manager, provider, and care team.   Interventions: 1:1 collaboration with primary care provider regarding development and update of comprehensive plan of care as evidenced by provider attestation and co-signature Inter-disciplinary care team collaboration (see longitudinal plan of care) Evaluation of current treatment plan related to  self management and patient's adherence to plan as established by provider 03/13/21: Initial Assessment completed Pain assessment updated; patient declines referral to pain management specialist; reports does not like taking medications for pain; reports overall very poor pain management at home; states the only thing that she does to help mitigate pain "is to lay around all day" Falls assessment completed: denies falls x 12 months, but reports several "near falls" as a result of chronic pain-- she is requesting a rollator walker-- will message PCP  03/13/21: SDOH completed; [name] referral placed as indicated, as above 03/13/21: Depression screening updated; patient confirms today that she is NOT suicidal; however, she states frequently throughout our conversation that she "feels immobilized by her depression;" states that she "can't do anything but lay around all day;" "have no motivation to do anything at all;"  Reports she was contacted by [facility] psychiatric provider as per PCP referral 03/01/21-- reports "they will not take my insurance;" she remains interested in obtaining a new psychiatric provider  Confirmed she is NOT currently taking any psychiatric medications; states "it didn't seem to be working, so why take medicine if it is not working" Provided resource for [employer]: she confirms that she has the brochure for this facility and will use if needed/ necessary; re-confirmed address and phone number with patient  [phone]) Discussed that I would place CCM CSW referral for management of mental health needs: encouraged patient to listen out for call from scheduler, who will schedule a phone call with the CSW  Heart Failure Interventions:  (Status: New goal.)  Long Term Goal  Wt Readings from Last 3 Encounters:  03/01/21 (!) 319 lb (144.7 kg)  02/12/21 (!) 323 lb (146.5 kg)  12/29/20 (!) 333 lb 6.4 oz (151.2 kg)  Basic overview and discussion of pathophysiology of Heart Failure reviewed Provided education on low sodium diet Assessed need for readable accurate scales in home Discussed importance of daily weight and advised patient to weigh and record daily Reviewed role of diuretics in prevention of fluid overload and management of heart failure Discussed the importance of keeping all appointments with provider Confirmed patient does not monitor and/ or record daily weights/ blood pressures, etc at home, she states, "I'm so depressed, I just really don't care that much about my health" Confirmed patient's husband manages her medications for her: he fills weekly pill box for patient and she requires intermittent reminding to take as prescribed Reviewed recent PCP office visit 03/01/21 with patient- she confirms no medication changes; states that she requested a prescription for "Nystop medicated powder" for  rash at her breast line; at time of office visit: has not heard back: will re-request from PCP  Diabetes:  (Status: New goal.) Long Term Goal   Lab Results  Component Value Date   HGBA1C 6.9 (H) 03/01/2021  Provided education to patient about basic DM disease process; Reviewed medications with patient and discussed importance of medication adherence;        Reviewed prescribed diet with patient low carb/ sugar, heart healthy, low salt/ low cholesterol: reports overall adherence to diet, has trouble affording fresh food in light of national economy/ high grocery prices-- CCM Community resource care  guide referral placed; Discussed plans with patient for ongoing care management follow up and provided patient with direct contact information for care management team;      Reviewed scheduled/upcoming provider appointments including: 04/02/21 cardiology provider; 08/08/21- [facility] ;         Referral made to social work team for assistance with debilitating depression with request for new psychiatric provider;      Referral made to community resources care guide team for assistance with fresh food pantries/ affordable dental services;      Review of patient status, including review of consultants reports, relevant laboratory and other test results, and medications completed;       Confirmed patient does not monitor/ record blood sugars at home; states, "I am just not motivated to do anything I should be doing;"   Patient Goals/Self-Care Activities: As evidenced by review of EHR, collaboration with care team, and patient reporting during CCM RN CM outreach,  Patient [name] will: Take medications as prescribed Attend all scheduled provider appointments Call pharmacy for medication refills Call provider office for new concerns or questions Continue your efforts to follow heart healthy, low salt, low cholesterol, carbohydrate-modified, low sugar diet Please listen for a call from the [employer] care Guide team: I have asked them to call you to provide resources for food pantries that provide FRESH vegetables/ food, and to provide you with resources for affordable dental services I have placed a referral for the Clinical Social Worker to talk to you about your management of depression and your need for a new psychiatric provider- please listen for a call from the scheduler to contact you to make a telephone appointment with the social worker I have made Dr. Ronnald [name] aware of your request for a rollator walker and for the medicated powder you asked about-- please listen for follow up on these requests from  Dr. Ronnald [name]' office staff Review enclosed educational material                    Plan: Telephone follow up appointment with care management team member scheduled for:  Tuesday, April 17, 2021 at 3:00 pm The patient has been provided with contact information for the care management team and has been advised to call with any health related questions or concerns  Oneta Rack, RN, BSN, [place] [phone]: direct office

## 2021-03-13 NOTE — Patient Instructions (Addendum)
Visit Information   Thank you for taking time to talk with me today. Please don't hesitate to contact me if I can be of assistance to you before our next scheduled telephone appointment.  Below are the goals we discussed today:  Patient Self-Care Activities: Patient [name] will: Take medications as prescribed Attend all scheduled provider appointments Call pharmacy for medication refills Call provider office for new concerns or questions Continue your efforts to follow heart healthy, low salt, low cholesterol, carbohydrate-modified, low sugar diet Please listen for a call from the [employer] care Guide team: I have asked them to call you to provide resources for food pantries that provide FRESH vegetables/ food, and to provide you with resources for affordable dental services I have placed a referral for the Clinical Social Worker to talk to you about your management of depression and your need for a new psychiatric provider- please listen for a call from the scheduler to contact you to make a telephone appointment with the social worker I have made Dr. Ronnald Ramp aware of your request for a rollator walker and for the medicated powder you asked about-- please listen for follow up on these requests from Dr. Ronnald Ramp' office staff Review enclosed educational material                  Our next scheduled telephone follow up visit/ appointment with care management team member is scheduled on:  Tuesday, April 17, 2021 at 3:00 pm- This is a PHONE [place] appointment  If you need to cancel or re-schedule our visit, please call [phone] and our care guide team will be happy to assist you.   I look forward to hearing about your progress.   Oneta Rack, RN, BSN, [place] [phone]: direct office  If you are experiencing a Mental Health or [place] or need someone to talk to, please  call the Suicide and Crisis  Lifeline: 988 call the [place] National Suicide Prevention Lifeline: 808-493-9808 or TTY: 931-821-2827 TTY (707)748-6901) to talk to a trained counselor call 1-800-273-TALK (toll free, 24 hour hotline) go to [facility] ([phone]) call 911   Major Depressive Disorder, Adult Major depressive disorder is a mental health condition. This disorder affects feelings. It can also affect the body. Symptoms of this condition last most of the day, almost every day, for 2 weeks. This disorder can affect: Relationships. Daily activities, such as work and school. Activities that you normally like to do. What are the causes? The cause of this condition is not known. The disorder is likely caused by a mix of things, including: Your personality, such as being a shy person. Your behavior, or how you act toward others. Your thoughts and feelings. Too much alcohol or drugs. How you react to stress. Health and mental problems that you have had for a long time. Things that hurt you in the past (trauma). Big changes in your life, such as divorce. What increases the risk? The following factors may make you more likely to develop this condition: Having family members with depression. Being a woman. Problems in the family. Low levels of some brain chemicals. Things that caused you pain as a child, especially if you lost a parent or were abused. A lot of stress in your life, such as from: Living without basic needs of life, such as food and shelter. Being treated poorly because of race, sex,  or religion (discrimination). Health and mental problems that you have had for a long time. What are the signs or symptoms? The main symptoms of this condition are: Being sad all the time. Being grouchy all the time. Loss of interest in things and activities. Other symptoms include: Sleeping too much or too little. Eating too much or too  little. Gaining or losing weight, without knowing why. Feeling tired or having low energy. Being restless and weak. Feeling hopeless, worthless, or guilty. Trouble thinking clearly or making decisions. Thoughts of hurting yourself or others, or thoughts of ending your life. Spending a lot of time alone. Inability to complete common tasks of daily life. If you have very bad MDD, you may: Believe things that are not true. Hear, see, taste, or feel things that are not there. Have mild depression that lasts for at least 2 years. Feel very sad and hopeless. Have trouble speaking or moving. How is this treated? This condition may be treated with: Talk therapy. This teaches you to know bad thoughts, feelings, and actions and how to change them. This can also help you to communicate with others. This can be done with members of your family. Medicines. These can be used to treat worry (anxiety), depression, or low levels of chemicals in the brain. Lifestyle changes. You may need to: Limit alcohol use. Limit drug use. Get regular exercise. Get plenty of sleep. Make healthy eating choices. Spend more time outdoors. Brain stimulation. This treatment excites the brain. This is done when symptoms are very bad or have not gotten better with other treatments. Follow these instructions at home: Activity Get regular exercise as told. Spend time outdoors as told. Make time to do the things you enjoy. Find ways to deal with stress. Try to: Meditate. Do deep breathing. Spend time in nature. Keep a journal. Return to your normal activities as told by your doctor. Ask your doctor what activities are safe for you. Alcohol and drug use If you drink alcohol: Limit how much you use to: 0-1 drink a day for women. 0-2 drinks a day for men. Be aware of how much alcohol is in your drink. In the U.S., one drink equals one 12 oz bottle of beer (355 mL), one 5 oz glass of wine (148 mL), or one 1 oz glass  of hard liquor (44 mL). Talk to your doctor about: Alcohol use. Alcohol can affect some medicines. Any drug use. General instructions  Take over-the-counter and prescription medicines and herbal preparations only as told by your doctor. Eat a healthy diet. Get a lot of sleep. Think about joining a support group. Your doctor may be able to suggest one. Keep all follow-up visits as told by your doctor. This is important. Where to find more information: [employer] on Mental Illness: www.nami.[place]: https://carter.com/ American Psychiatric Association: www.psychiatry.org/patients-families/ Contact a doctor if: Your symptoms get worse. You get new symptoms. Get help right away if: You hurt yourself. You have serious thoughts about hurting yourself or others. You see, hear, taste, smell, or feel things that are not there. If you ever feel like you may hurt yourself or others, or have thoughts about taking your own life, get help right away. Go to your nearest emergency department or: Call your local emergency services (911 in the U.S.). Call a suicide crisis helpline, such as the [place] at 870-844-3455 or 988 in the [place]. This is open 24 hours a day in the  U.S. Text the Crisis Text Line at (531) 268-2150 (in the U.S.). Summary Major depressive disorder is a mental health condition. This disorder affects feelings. Symptoms of this condition last most of the day, almost every day, for 2 weeks. The symptoms of this disorder can cause problems with relationships and with daily activities. There are treatments and support for people who get this disorder. You may need more than one type of treatment. Get help right away if you have serious thoughts about hurting yourself or others. This information is not intended to replace advice given to you by your health care provider. Make sure you discuss any questions you have with your  health care provider. Document Revised: 09/06/2020 Document Reviewed: 01/23/2019 Elsevier Patient Education  2022 [employer].   Following is a copy of your full care plan:  Care Plan : RN Care Manager Plan of Care  Updates made by Knox Royalty, RN since 03/13/2021 12:00 AM     Problem: Chronic Disease Management Needs   Priority: High     Long-Range Goal: Development of Plan of Care for long term chronic disease management   Start Date: 03/13/2021  Expected End Date: 03/13/2022  Priority: High  Note:   Current Barriers:  Chronic Disease Management support and education needs related to CHF, DMII, and severe depression Chronic back pain: reports "9/10" chronic back on daily basis: states ongoing pain prevents her from going upstairs to sleep in bedroom, she is currently sleeping downstairs; reports chronic pain makes her unable to do iADL's and her husband is essentially her caregiver for both ADL's and iADL's; she declines referral for pain management provider; states she rarely takes medications for pain; patient also reports that in the past PCP has referred her to outpatient rehabilitation/ PT, however, she never followed up because she is "so immobilized by depression and pain" that she could "never bring herself" to call and schedule with rehabilitation team-- she is currently not interested in another outpatient rehabilitation referral Ongoing depression- reports long-standing history depression in her family and herself; reports she had psychiatric provider that she "liked" very much, however, she did not feel that she was making progress in therapy with previous psychiatric provider: states she did not believe the medications provider prescribed was helping, and "all he wanted to do was to increase the doses;" today, 03/13/21- she confirms that she is not under psychiatric care and that she is NOT taking prescribed medications for depression Self- acknowledges lack of motivation  around state of health: she attributes this to both her ongoing sever depression and her chronic back pain Need for resources for affordable dental services and for food acquisition for FRESH food only: has tried other food pantries, but "they are all unhealthy food choices/ canned" food, which patient is not interested in: [place] referral placed Need for DME: reports she would like a rollator walker, "for a large person:" states she asked PCP about this at time of PCP office visit and has not yet heard anything; I will reach out to PCP to re-request on patient's behalf  RNCM Clinical Goal(s):  Patient will demonstrate improved health management independence as evidenced by adherence to plan of care for depression, CHF, and DMII          through collaboration with RN Care manager, provider, and care team.   Interventions: 1:1 collaboration with primary care provider regarding development and update of comprehensive plan of care as evidenced by provider attestation and co-signature Inter-disciplinary care  team collaboration (see longitudinal plan of care) Evaluation of current treatment plan related to  self management and patient's adherence to plan as established by provider 03/13/21: Initial Assessment completed Pain assessment updated; patient declines referral to pain management specialist; reports does not like taking medications for pain; reports overall very poor pain management at home; states the only thing that she does to help mitigate pain "is to lay around all day" Falls assessment completed: denies falls x 12 months, but reports several "near falls" as a result of chronic pain-- she is requesting a rollator walker-- will message PCP  03/13/21: SDOH completed; [place] referral placed as indicated, as above 03/13/21: Depression screening updated; patient confirms today that she is NOT suicidal; however, she states frequently throughout our conversation  that she "feels immobilized by her depression;" states that she "can't do anything but lay around all day;" "have no motivation to do anything at all;"  Reports she was contacted by [facility] psychiatric provider as per PCP referral 03/01/21-- reports "they will not take my insurance;" she remains interested in obtaining a new psychiatric provider  Confirmed she is NOT currently taking any psychiatric medications; states "it didn't seem to be working, so why take medicine if it is not working" Provided resource for [employer]: she confirms that she has the brochure for this facility and will use if needed/ necessary; re-confirmed address and phone number with patient [phone]) Discussed that I would place CCM CSW referral for management of mental health needs: encouraged patient to listen out for call from scheduler, who will schedule a phone call with the CSW  Heart Failure Interventions:  (Status: New goal.)  Long Term Goal  Wt Readings from Last 3 Encounters:  03/01/21 (!) 319 lb (144.7 kg)  02/12/21 (!) 323 lb (146.5 kg)  12/29/20 (!) 333 lb 6.4 oz (151.2 kg)  Basic overview and discussion of pathophysiology of Heart Failure reviewed Provided education on low sodium diet Assessed need for readable accurate scales in home Discussed importance of daily weight and advised patient to weigh and record daily Reviewed role of diuretics in prevention of fluid overload and management of heart failure Discussed the importance of keeping all appointments with provider Confirmed patient does not monitor and/ or record daily weights/ blood pressures, etc at home, she states, "I'm so depressed, I just really don't care that much about my health" Confirmed patient's husband manages her medications for her: he fills weekly pill box for patient and she requires intermittent reminding to take as prescribed Reviewed recent PCP office visit 03/01/21 with patient- she confirms no  medication changes; states that she requested a prescription for "Nystop medicated powder" for rash at her breast line; at time of office visit: has not heard back: will re-request from PCP  Diabetes:  (Status: New goal.) Long Term Goal   Lab Results  Component Value Date   HGBA1C 6.9 (H) 03/01/2021  Provided education to patient about basic DM disease process; Reviewed medications with patient and discussed importance of medication adherence;        Reviewed prescribed diet with patient low carb/ sugar, heart healthy, low salt/ low cholesterol: reports overall adherence to diet, has trouble affording fresh food in light of national economy/ high grocery prices-- CCM Community resource care guide referral placed; Discussed plans with patient for ongoing care management follow up and provided patient with direct contact information for care management team;      Reviewed scheduled/upcoming provider  appointments including: 04/02/21 cardiology provider; 08/08/21- [facility] ;         Referral made to social work team for assistance with debilitating depression with request for new psychiatric provider;      Referral made to community resources care guide team for assistance with fresh food pantries/ affordable dental services;      Review of patient status, including review of consultants reports, relevant laboratory and other test results, and medications completed;       Confirmed patient does not monitor/ record blood sugars at home; states, "I am just not motivated to do anything I should be doing;"   Patient Goals/Self-Care Activities: As evidenced by review of EHR, collaboration with care team, and patient reporting during CCM RN CM outreach,  Patient Arrielle will: Take medications as prescribed Attend all scheduled provider appointments Call pharmacy for medication refills Call provider office for new concerns or questions Continue your efforts to follow heart healthy, low salt, low cholesterol,  carbohydrate-modified, low sugar diet Please listen for a call from the [employer] care Guide team: I have asked them to call you to provide resources for food pantries that provide FRESH vegetables/ food, and to provide you with resources for affordable dental services I have placed a referral for the Clinical Social Worker to talk to you about your management of depression and your need for a new psychiatric provider- please listen for a call from the scheduler to contact you to make a telephone appointment with the social worker I have made Dr. Ronnald Ramp aware of your request for a rollator walker and for the medicated powder you asked about-- please listen for follow up on these requests from Dr. Ronnald Ramp' office staff Review enclosed educational material                    Consent to CCM Services: Ms. Belardo was given information 03/02/21 about Chronic Care Management services including:  CCM service includes personalized support from designated clinical staff supervised by her physician, including individualized plan of care and coordination with other care providers 24/7 contact phone numbers for assistance for urgent and routine care needs. Service will only be billed when office clinical staff spend 20 minutes or more in a month to coordinate care. Only one practitioner may furnish and bill the service in a calendar month. The patient may stop CCM services at any time (effective at the end of the month) by phone call to the office staff. The patient will be responsible for cost sharing (co-pay) of up to 20% of the service fee (after annual deductible is met).  Patient agreed to services and verbal consent obtained.   The patient verbalized understanding of instructions, educational materials, and care plan provided today and agreed to receive a mailed copy of patient instructions, educational materials, and care plan Telephone follow up appointment with care management team member  scheduled for:  Tuesday, April 17, 2021 at 3:00 pm The patient has been provided with contact information for the care management team and has been advised to call with any health related questions or concerns

## 2021-03-14 ENCOUNTER — Telehealth: Payer: Self-pay

## 2021-03-14 ENCOUNTER — Telehealth: Payer: Self-pay | Admitting: *Deleted

## 2021-03-14 ENCOUNTER — Other Ambulatory Visit: Payer: Self-pay | Admitting: Internal Medicine

## 2021-03-14 DIAGNOSIS — B354 Tinea corporis: Secondary | ICD-10-CM | POA: Insufficient documentation

## 2021-03-14 DIAGNOSIS — G8929 Other chronic pain: Secondary | ICD-10-CM

## 2021-03-14 DIAGNOSIS — M5136 Other intervertebral disc degeneration, lumbar region: Secondary | ICD-10-CM

## 2021-03-14 DIAGNOSIS — M545 Low back pain, unspecified: Secondary | ICD-10-CM

## 2021-03-14 MED ORDER — KETOCONAZOLE 2 % EX CREA: 1.0000 "application " | TOPICAL_CREAM | Freq: Two times a day (BID) | CUTANEOUS | 2 refills | Status: DC

## 2021-03-14 NOTE — Chronic Care Management (AMB) (Signed)
°  Care Management   Note  03/14/2021 Name: [name] MRN: [id number] DOB: January 13, 1966  [name] is a 55 y.o. year old female who is a primary care patient of [name], MD and is actively engaged with the care management team. I reached out to [name] by phone today to assist with scheduling an initial visit with the Licensed Clinical Social Worker  Follow up plan: Unsuccessful telephone outreach attempt made. A HIPAA compliant phone message was left for the patient providing contact information and requesting a return call.  The care management team will reach out to the patient again over the next 7 days.  If patient returns call to provider office, please advise to call [place] at [phone].  [place] Management  Direct Dial: [phone]

## 2021-03-14 NOTE — Telephone Encounter (Signed)
-----   Message from [name], MD sent at 03/14/2021  7:27 AM EST ----- 1) can you help with this? 2) Ketoconazole is a better option - I ordered it  TJ  ----- Message ----- From: Knox Royalty, RN Sent: 03/13/2021   5:48 PM EST To: [name], MD  Hi Dr. Lavone Orn, Patient would like for you to order: 1) DME for rollator walker, "a big one for a large person" 2) "Nystop" medicated powder for a rash she has under breasts  I have placed CCM CSW referral for assistance with mental health needs-- patient said she heard from the referral you placed with [facility] and told me "they told me they don't take my insurance;"  hopefully Neoma Laming will be able to help; patient admits she is not taking any of her mental health medications, because "they don't work" Thanks, Oneta Rack, RN, BSN, [place] [phone]: direct office

## 2021-03-14 NOTE — Telephone Encounter (Signed)
DME ordered for wide and/or plus sized rollator. Community message sent to [employer]'s rep Darlina Guys in regard.

## 2021-03-16 ENCOUNTER — Telehealth: Payer: Self-pay

## 2021-03-16 NOTE — Telephone Encounter (Signed)
° °  Telephone encounter was:  Unsuccessful.  03/16/2021 Name: Nizhoni Parlow MRN: [id number] DOB: 04-02-1965  Unsuccessful outbound call made today to assist with:  Food Insecurity and Dentures  Outreach Attempt:  1st Attempt  A HIPAA compliant voice message was left requesting a return call.  Instructed patient to call back at earliest convenience .    [place], Care Management  [phone] [address], [place], [place] [address] Phone: [phone] Email: [email or web address]

## 2021-03-16 NOTE — Chronic Care Management (AMB) (Signed)
°  Care Management   Note  03/16/2021 Name: [name] MRN: [id number] DOB: 1965-09-02  [name] is a 56 y.o. year old female who is a primary care patient of [name], MD and is actively engaged with the care management team. I reached out to [name] by phone today to assist with scheduling an initial visit with the Licensed Clinical Social Worker  Follow up plan: Telephone appointment with care management team member scheduled for:03/21/21  [place] Management  Direct Dial: [phone]

## 2021-03-19 ENCOUNTER — Telehealth: Payer: Self-pay

## 2021-03-19 NOTE — Telephone Encounter (Signed)
° °  Telephone encounter was:  Unsuccessful.  03/19/2021 Name: [name] MRN: [id number] DOB: March 13, 1965  Unsuccessful outbound call made today to assist with:  Food Insecurity and dentures  Outreach Attempt:  2nd Attempt   A HIPAA compliant voice message was left requesting a return call.  Instructed patient to call back at spoke with husband and he asked I call back around 11 am pt was asleep.    [place], Care Management  [phone] [address], [place], [place] [address] Phone: [phone] Email: [email or web address]

## 2021-03-19 NOTE — Telephone Encounter (Signed)
° °  Telephone encounter was:  Unsuccessful.  03/19/2021 Name: [name] MRN: [id number] DOB: Jul 22, 1965  Unsuccessful outbound call made today to assist with:  Food Insecurity and dentures  Outreach Attempt:  2nd Attempt  A HIPAA compliant voice message was left requesting a return call.  Instructed patient to call back at  at earliest convenience.    [place], Care Management  [phone] [address], [place], [place] [address] Phone: [phone] Email: [email or web address]

## 2021-03-20 ENCOUNTER — Ambulatory Visit: Payer: Medicare Other | Admitting: Licensed Clinical Social Worker

## 2021-03-20 DIAGNOSIS — F3341 Major depressive disorder, recurrent, in partial remission: Secondary | ICD-10-CM

## 2021-03-20 DIAGNOSIS — F902 Attention-deficit hyperactivity disorder, combined type: Secondary | ICD-10-CM

## 2021-03-21 ENCOUNTER — Ambulatory Visit: Payer: Medicare Other | Admitting: Licensed Clinical Social Worker

## 2021-03-21 ENCOUNTER — Telehealth: Payer: Self-pay

## 2021-03-21 ENCOUNTER — Other Ambulatory Visit (HOSPITAL_COMMUNITY): Payer: Self-pay | Admitting: Psychiatry

## 2021-03-21 ENCOUNTER — Other Ambulatory Visit: Payer: Self-pay | Admitting: Internal Medicine

## 2021-03-21 DIAGNOSIS — F3341 Major depressive disorder, recurrent, in partial remission: Secondary | ICD-10-CM

## 2021-03-21 DIAGNOSIS — B354 Tinea corporis: Secondary | ICD-10-CM

## 2021-03-21 DIAGNOSIS — F902 Attention-deficit hyperactivity disorder, combined type: Secondary | ICD-10-CM

## 2021-03-21 DIAGNOSIS — J301 Allergic rhinitis due to pollen: Secondary | ICD-10-CM

## 2021-03-21 DIAGNOSIS — K5904 Chronic idiopathic constipation: Secondary | ICD-10-CM

## 2021-03-21 MED ORDER — NYSTATIN 100000 UNIT/ML MT SUSP: 500000.0000 [IU] | Freq: Four times a day (QID) | OROMUCOSAL | 1 refills | Status: AC

## 2021-03-21 NOTE — Patient Instructions (Signed)
Visit Information  Thank you for taking time to visit with me today. Please don't hesitate to contact me if I can be of assistance to you before our next scheduled telephone appointment.  Following are the goals we discussed today: Mental health treatment Patient Self-Care Activities: Keep virtual appointment Sat. Feb 4th at 10:30 with psychiatry  The scheduler will call to get an appointment with Tommi Rumps for therapy  Utilize the information below if you have a mental health crisis [place]  [address] [place], [place]  Crisis [phone]  Our next appointment is by telephone on Feb 8th at 1:30  Please call the care guide team at [phone] if you need to cancel or reschedule your appointment.   If you are experiencing a Mental Health or [place] or need someone to talk to, please call the Suicide and Crisis Lifeline: 988 call the [place] National Suicide Prevention Lifeline: (417) 517-9541 or TTY: (520) 018-3423 TTY 802-241-1502) to talk to a trained counselor call 1-800-273-TALK (toll free, 24 hour hotline) call 911   Patient verbalizes understanding of instructions and care plan provided today and agrees to view in [place]. Active MyChart status confirmed with patient.    [name], LCSW Licensed Clinical Social Worker [place]  [place] [place]  [phone]

## 2021-03-21 NOTE — Chronic Care Management (AMB) (Signed)
Chronic Care Management   Clinical Social Work Note  03/21/2021 Name: [name] MRN: [id number] DOB: 1965/11/22  [name] is a 56 y.o. year old female who is a primary care patient of Samantha Lima, MD. The CCM team was consulted to assist the patient with chronic disease management and/or care coordination needs related to: Mental Health Counseling and Resources.   Engaged with patient by telephone for follow up visit in response to provider referral for social work chronic care management and care coordination services.   Consent to Services:  The patient was given information about Chronic Care Management services, agreed to services, and gave verbal consent prior to initiation of services.  Please see initial visit note for detailed documentation.   Patient agreed to services and consent obtained.   Summary:  Patient continues to experience difficulty with managing symptoms of depression.Marland KitchenLCSW was able to collaborate with [facility] to get appointment right away for patient.  Psychiatry appointment Sat. Feb 4th at 10:30. She will also reconnect with previous therapist [name] at [facility]. ([facility] at [facility] to schedule appointment)  See Care Plan below for interventions and patient self-care actives.  Recommendation: Patient may benefit from, and is in agreement to keep appointments scheduled and ask husband to assist her with getting connected for the virtual appointment.   Follow up Plan: Patient would like continued follow-up from CCM LCSW.  per patient's request will follow up in 2 weeks.  Will call office if needed prior to next encounter.   Assessment: Review of patient past medical history, allergies, medications, and health status, including review of relevant consultants reports was performed today as part of a comprehensive evaluation and provision of chronic care management and care coordination services.     SDOH (Social Determinants of  Health) assessments and interventions performed:    Advanced Directives Status: Not addressed in this encounter.  CCM Care Plan   Conditions to be addressed/monitored: Depression; Mental Health Concerns   Care Plan : LCSW Plan of Care  Updates made by Samantha Cane, LCSW since 03/21/2021 12:00 AM     Problem: Symptoms of Depression not managed      Goal: Symptoms Monitored and Managed with medication and therapy   Start Date: 03/20/2021  Expected End Date: 06/24/2021  This Visit's Progress: On track  Recent Progress: On track  Priority: High  Note:   Current Barriers:  Disease Management support and education needs related to Depression: depressed mood loss of energy/fatigue difficulty concentrating Unable to locate a provider that will take insurance  CSW Clinical Goal(s):  Patient  will work with mental health provider and LCSW to address needs related to symptoms of depression  through collaboration with Clinical Education officer, museum, provider, and care team.   Interventions: 1:1 collaboration with primary care provider regarding development and update of comprehensive plan of care as evidenced by provider attestation and co-signature Inter-disciplinary care team collaboration (see longitudinal plan of care) Evaluation of current treatment plan related to  self management and patient's adherence to plan as established by provider  Mental Health:  (Status: Goal on Track (progressing): YES.) Evaluation of current treatment plan related to ADHD, Mood Instability, and symptoms of depression  Solution-Focused Strategies employed:  Active listening / Reflection utilized  Emotional Support Provided Problem [place] strategies reviewed Provided psychoeducation for mental health needs  Crisis Resource Education / information provided  Collaborated with [facility] health to get medication management and therapy appointment  Patient Self-Care  Activities: Keep virtual appointment Sat. Feb 4th at 10:30 with psychiatry  The scheduler will call to get an appointment with [name] for therapy  Utilize the information below if you have a mental health crisis [facility]  [address] [place], [place]  Crisis [place], [place] Licensed Clinical Social Worker [name] Management  [place]  [phone]

## 2021-03-21 NOTE — Chronic Care Management (AMB) (Signed)
Chronic Care Management    Clinical Social Work Note  03/21/2021 Name: [name] MRN: [id number] DOB: [birth year]  [name] is a 56 y.o. year old female who is a primary care patient of [name], MD. The CCM team was consulted to assist the patient with chronic disease management and/or care coordination needs related to: Mental Health Counseling and Resources.   Engaged with patient by telephone for initial visit in response to provider referral for social work chronic care management and care coordination services.   Consent to Services:  The patient was given the following information about Chronic Care Management services today, agreed to services, and gave verbal consent: 1. CCM service includes personalized support from designated clinical staff supervised by the primary care provider, including individualized plan of care and coordination with other care providers 2. 24/7 contact phone numbers for assistance for urgent and routine care needs. 3. Service will only be billed when office clinical staff spend 20 minutes or more in a month to coordinate care. 4. Only one practitioner may furnish and bill the service in a calendar month. 5.The patient may stop CCM services at any time (effective at the end of the month) by phone call to the office staff. 6. The patient will be responsible for cost sharing (co-pay) of up to 20% of the service fee (after annual deductible is met). Patient agreed to services and consent obtained.  Patient agreed to services and consent obtained.   Summary: Assessed patient's previous and current treatment, coping skills, support system and barriers to care. She continues to experience difficulty with managing symptoms of depression. Currently not treatment but is willing to seek help.Reports extreme depression with history of depression in her family. Most of the time during this encounter was spent trying to locate treatment options.  See Care Plan below  for interventions and patient self-care actives.  Recommendation: Patient may benefit from, and is in agreement for LCSW to make referral to [facility]. Urgent referral sent..   Follow up Plan: Patient would like continued follow-up from CCM LCSW.  per patient's request will follow up in 1 week.  Will call office if needed prior to next encounter.    Assessment: Review of patient past medical history, allergies, medications, and health status, including review of relevant consultants reports was performed today as part of a comprehensive evaluation and provision of chronic care management and care coordination services.     SDOH (Social Determinants of Health) assessments and interventions performed:  SDOH Interventions    Flowsheet Row Most Recent Value  SDOH Interventions   SDOH Interventions for the Following Domains Depression  Depression Interventions/Treatment  Referral to Psychiatry, Counseling        Advanced Directives Status: Not addressed in this encounter.  CCM Care Plan   Conditions to be addressed/monitored: Depression; Mental Health Concerns   Care Plan : LCSW Plan of Care  Updates made by Maurine Cane, LCSW since 03/21/2021 12:00 AM     Problem: Symptoms of Depression not managed      Goal: Symptoms Monitored and Managed with medication and therapy   Start Date: 03/20/2021  Expected End Date: 06/24/2021  This Visit's Progress: On track  Priority: High  Note:   Current Barriers:  Disease Management support and education needs related to Depression: depressed mood loss of energy/fatigue difficulty concentrating Unable to locate a provider that will take insurance  CSW Clinical Goal(s):  Patient  will work with mental health provider  and LCSW to address needs related to symptoms of depression  through collaboration with Clinical Education officer, museum, provider, and care team.   Interventions: 1:1 collaboration with primary care provider  regarding development and update of comprehensive plan of care as evidenced by provider attestation and co-signature Inter-disciplinary care team collaboration (see longitudinal plan of care) Evaluation of current treatment plan related to  self management and patient's adherence to plan as established by provider Review resources, discussed options and provided patient information about  Options for mental health treatment based on need and insurance  Mental Health:  (Status: New goal.) Evaluation of current treatment plan related to ADHD, Mood Instability, and symptoms of depression  Depression screen reviewed  Solution-Focused Strategies employed:  Active listening / Reflection utilized  Problem [place] strategies reviewed Crisis Resource Education / information provided  Discussed referral for psychiatry: for medication management Collaborated with [facility] and [place] referral to [facility] for medication management and therapy   Patient Self-Care Activities: I have placed a referral to [facility] they will contact you.   [facility]  [place], [place]      [name], LCSW Licensed Clinical Social Worker [facility] Management  [place]  [phone]

## 2021-03-21 NOTE — Patient Instructions (Signed)
Visit Information   Thank you for taking time to visit with me today. Please don't hesitate to contact me if I can be of assistance to you before our next scheduled telephone appointment.  Following are the goals we discussed today:   Our next appointment is by telephone in one week.  Please call the care guide team at [phone] if you need to cancel or reschedule your appointment.   If you are experiencing a Mental Health or [place] or need someone to talk to, please call the Suicide and Crisis Lifeline: 988 call the [place] National Suicide Prevention Lifeline: 415-449-4082 or TTY: 517-723-2687 TTY 425-292-3751) to talk to a trained counselor call 1-800-273-TALK (toll free, 24 hour hotline) go to [facility] [address], [place] [phone]) call 911   Following is a copy of your full care plan:  Care Plan : LCSW Plan of Care  Updates made by Maurine Cane, LCSW since 03/21/2021 12:00 AM     Problem: Symptoms of Depression not managed      Goal: Symptoms Monitored and Managed with medication and therapy   Start Date: 03/20/2021  Expected End Date: 06/24/2021  This Visit's Progress: On track  Priority: High  Note:   Current Barriers:  Disease Management support and education needs related to Depression: depressed mood loss of energy/fatigue difficulty concentrating Unable to locate a provider that will take insurance  CSW Clinical Goal(s):  Patient  will work with mental health provider and LCSW to address needs related to symptoms of depression  through collaboration with Clinical Education officer, museum, provider, and care team.   Interventions: 1:1 collaboration with primary care provider regarding development and update of comprehensive plan of care as evidenced by provider attestation and co-signature Inter-disciplinary care team collaboration (see longitudinal plan of care) Evaluation of current treatment plan  related to  self management and patient's adherence to plan as established by provider Review resources, discussed options and provided patient information about  Options for mental health treatment based on need and insurance  Mental Health:  (Status: New goal.) Evaluation of current treatment plan related to ADHD, Mood Instability, and symptoms of depression  Depression screen reviewed  Solution-Focused Strategies employed:  Active listening / Reflection utilized  Problem [place] strategies reviewed Crisis Resource Education / information provided  Discussed referral for psychiatry: for medication management Collaborated with [facility] and [place] referral to [facility] for medication management and therapy   Patient Self-Care Activities: I have placed a referral to [facility] they will contact you.   [facility]  [place], [place]  Crisis [phone]     Consent to CCM Services: Ms. Pinder was given information about Chronic Care Management services including:  CCM service includes personalized support from designated clinical staff supervised by her physician, including individualized plan of care and coordination with other care providers 24/7 contact phone numbers for assistance for urgent and routine care needs. Service will only be billed when office clinical staff spend 20 minutes or more in a month to coordinate care. Only one practitioner may furnish and bill the service in a calendar month. The patient may stop CCM services at any time (effective at the end of the month) by phone call to the office staff. The patient will be responsible for cost sharing (co-pay) of up to 20% of the service fee (after annual deductible is  met).  Patient agreed to services and verbal consent obtained.   Patient verbalizes understanding of  instructions and care plan provided today and agrees to view in [place]. Active MyChart status confirmed with patient.    [name], LCSW Licensed Clinical Social Worker [facility]  [place]  [phone]

## 2021-03-21 NOTE — Telephone Encounter (Signed)
° °  Telephone encounter was:  Unsuccessful.  03/21/2021 Name: [name] MRN: [id number] DOB: Jul 06, 1965  Unsuccessful outbound call made today to assist with:  Food Insecurity and dentures  Outreach Attempt:  3rd Attempt.  Referral closed unable to contact patient.  A HIPAA compliant voice message was left requesting a return call.  Instructed patient to call back at earliest convenience.    [place], Care Management  [phone] [address], [place], [place] [address] Phone: [phone] Email: [email or web address]

## 2021-03-26 ENCOUNTER — Ambulatory Visit (INDEPENDENT_AMBULATORY_CARE_PROVIDER_SITE_OTHER): Payer: Medicare Other | Admitting: Licensed Clinical Social Worker

## 2021-03-26 ENCOUNTER — Other Ambulatory Visit: Payer: Self-pay

## 2021-03-26 DIAGNOSIS — F332 Major depressive disorder, recurrent severe without psychotic features: Secondary | ICD-10-CM

## 2021-03-26 DIAGNOSIS — F988 Other specified behavioral and emotional disorders with onset usually occurring in childhood and adolescence: Secondary | ICD-10-CM

## 2021-03-26 NOTE — Progress Notes (Signed)
Virtual Visit via Telephone Note   I connected with [name] on 03/26/21 at 3:00pm by telephone and verified that I am speaking with the correct person using two identifiers.   I discussed the limitations, risks, security and privacy concerns of performing an evaluation and management service by telephone and the availability of in person appointments. I also discussed with the patient that there may be a patient responsible charge related to this service. The patient expressed understanding and agreed to proceed.   I discussed the assessment and treatment plan with the patient. The patient was provided an opportunity to ask questions and all were answered. The patient agreed with the plan and demonstrated an understanding of the instructions.   The patient was advised to call back or seek an in-person evaluation if the symptoms worsen or if the condition fails to improve as anticipated.   I provided 1 hour of non-face-to-face time during this encounter.     Shade Flood, LCSW, LCAS ________________________________ THERAPIST PROGRESS NOTE   Session Time: 3:00pm - 4:00pm     Location: Patient: Patient Home Provider: [facility] [place] Office    Participation Level: Active   Behavioral Response: Alert, depressed mood   Type of Therapy:  Individual Therapy   Treatment Goals addressed: Depression, panic attack, and anxiety management; Medication Compliance; Monitoring substance use; Psychiatry followup    Interventions: CBT, treatment planning    Summary: [name] is a 56 year old married Caucasian female on disability that presented today for therapy appointment with diagnoses of Major depressive disorder, recurrent, severe with anxious distress; and ADD.         Suicidal/Homicidal: None; without plan or intent.    Therapist Response: Clinician spoke with [name] for telephone session today due to her inability to travel or access video meetings.  Clinician assessed for safety, sobriety, and  medication compliance.  Lisbet spoke in a manner that was alert, oriented x5, with no evidence or self-report of active SI/HI or A/V H.  Tejah reported that she stopped taking medication over 1 month ago, and has scheduled an appointment with a new psychiatrist for 03/31/21.  She denied any alcohol or drug use in over 3 months.  Clinician inquired about [name]'s current emotional ratings, as well as any significant changes in thoughts, feelings, or behavior since last check-in. [name] reported scores of 10/10 for depression, 10/10 for anxiety, and 6/10 for irritability.  [name] reported that she has had a few panic attacks over the past week, stating "I am not doing well".  Clinician assisted [name] in running cost benefit analysis regarding whether to engage in higher level of care based upon recent destabilization.  Breyon declined this recommendation, stating "Therapy alone isn't going to help.  I have a chemical imbalance and need a doctor that understands that".  Clinician recommended updating treatment plan today based upon length of time that has passed since last check-in, and she was agreeable to this, reporting that she lacks motivation and energy to do other activities.  Clinician collaborated with [name] to make updates as follows with her verbal consent provided:  Meet with clinician for individual therapy once per week in order to address progress towards goals and any barriers to success; Meet with new psychiatrist once per month in order to address efficacy of medication and make changes as needed to regimen/dose; Take medications daily as prescribed to reduce severity of symptoms and improve daily functioning; Meet with PCP MD and specialists as needed for ongoing management and  monitoring of physical health conditions;  Reduce depression from average severity level of 10/10 down to an 8/10 in the next 90 days by engaging in 1 hour per day of positive self care activities; Reduce anxiety from average  severity level of 10/10 down to an 8/10 and panic attacks from 4-5 on average weekly down to 2 by utilizing relaxation techniques learned from therapy such as deep breathing, meditation, and/or progressive muscle relaxation, as well as grounding techniques 2-3 times per day; Consider attending free community support groups through [facility] for applicable topics x1 per week in order to increase sense of support, and expand upon coping skills; Maintain abstinence from marijuana in order to avoid negative consequences from returning, including impact upon finances, relationships, and collective mental/physical health; Utilize 4-5 sleep hygiene techniques learned from therapy in order to increase average nightly rest to 8 hours and reduce related feelings of fatigue/irritability; Voluntarily seek hospitalization should SI/HI appear and safety of self and/or others is determined to be at risk due to development of plan/intent to harm.  Progress is evidenced by [name] reaching out for help again following extended absence from therapy, making an appointment to see a new psychiatrist, and following safety plan.  [name] continues to struggle with depression and anxiety, sleep issues, and lack of support. Clinician will continue to recommend higher level of care as long as [name] is appropriate, and will continue to monitor      Plan: Follow up again in 1 week.   Diagnosis: Major depressive disorder, recurrent, severe with anxious distress; and ADD.   Shade Flood, [place], LCAS 03/26/21

## 2021-03-27 ENCOUNTER — Telehealth: Payer: Self-pay

## 2021-03-27 ENCOUNTER — Telehealth: Payer: Self-pay | Admitting: Family Medicine

## 2021-03-27 DIAGNOSIS — E1159 Type 2 diabetes mellitus with other circulatory complications: Secondary | ICD-10-CM | POA: Diagnosis not present

## 2021-03-27 DIAGNOSIS — I509 Heart failure, unspecified: Secondary | ICD-10-CM

## 2021-03-27 DIAGNOSIS — F32A Depression, unspecified: Secondary | ICD-10-CM

## 2021-03-27 NOTE — Telephone Encounter (Signed)
Patient called asking if Dr Tamala Julian would be able to order another epidural injection for her?  Please advise.

## 2021-03-27 NOTE — Telephone Encounter (Signed)
Pt is calling thinking that she was going to get a Nystatin Powder but she received nystatin (MYCOSTATIN) 100000 UNIT/ML suspension for rash under her breast.  Pt CB [phone]

## 2021-03-28 ENCOUNTER — Other Ambulatory Visit: Payer: Self-pay

## 2021-03-28 DIAGNOSIS — M5416 Radiculopathy, lumbar region: Secondary | ICD-10-CM

## 2021-03-28 NOTE — Telephone Encounter (Signed)
Order placed. Patient notified.  

## 2021-03-31 ENCOUNTER — Ambulatory Visit (HOSPITAL_BASED_OUTPATIENT_CLINIC_OR_DEPARTMENT_OTHER): Payer: Medicare Other | Admitting: Psychiatry

## 2021-03-31 ENCOUNTER — Encounter (HOSPITAL_COMMUNITY): Payer: Self-pay | Admitting: Psychiatry

## 2021-03-31 ENCOUNTER — Other Ambulatory Visit: Payer: Self-pay

## 2021-03-31 VITALS — Wt 319.0 lb

## 2021-03-31 DIAGNOSIS — F332 Major depressive disorder, recurrent severe without psychotic features: Secondary | ICD-10-CM

## 2021-03-31 DIAGNOSIS — F419 Anxiety disorder, unspecified: Secondary | ICD-10-CM

## 2021-03-31 NOTE — Progress Notes (Signed)
Virtual Visit via Telephone Note  I connected with [name] on 03/31/21 at 10:00 AM EST by telephone and verified that I am speaking with the correct person using two identifiers.  Location: Patient: Home Provider: Home Office   I discussed the limitations, risks, security and privacy concerns of performing an evaluation and management service by telephone and the availability of in person appointments. I also discussed with the patient that there may be a patient responsible charge related to this service. The patient expressed understanding and agreed to proceed.     [facility] Initial Assessment Note  [name] [id number] 56 y.o.  03/31/2021 10:29 AM  Chief Complaint:  I stopped taking medication because they were not helping me.  History of Present Illness:  Patient is 56 year old married unemployed female who is a patient of Dr. Parke Poisson however has not seen in more than 6 months because she does not feel the medicine is working.  She was on Abilify, BuSpar and Zoloft for a while and has not seen any improvement with the medication and decided to stop the medication on her own.  She noticed for past 7 months increase in her depression which she describes lack of motivation to do things, either sleeping too much or too less, not taking care of herself and decline in personal hygiene.  She also reported feeling tired, sometimes irritable, overwhelmed and stressed.  She noticed some time watching television started to have teeth grinding because of anxiety.  She did not specify any stressors but described had a long history of depression and anxiety and runs in the family.  She did have some difficulty expressing her symptoms and wanted to try something different.  She is also not happy with Dr. Parke Poisson because apparently she had discussed few times the medicine not working but there were no new medication other than increasing the dose.  Otherwise she liked Dr. Parke Poisson a lot.   She denies any paranoia, hallucination, mania, nightmares, flashback or any drug use.  She reported her appetite is okay and her weight is unchanged from the past.  Patient has multiple health issues including cardiomyopathy, diabetes and back pain.  She is taking tramadol, beta-blocker and other medication for her general health needs.  Her last hemoglobin A1c was 6.9 which was done last month.  Her PCP recommended to see a new psychiatrist as she continues to have depressive symptoms.  Patient denies any drug use.  She lives with her 68 year old son who has Asperger.  Patient reported her husband had a second wife in his native country and apparently had a 31 year old son who lives there.  Patient told in the beginning it was very stressful but now she acknowledged and lived with the fact.  She told sometimes her husband leave the country for a few months and that bothers her a lot.  Patient's son is a high functioning Asperger's and attends college.  Patient's current symptoms describe anhedonia, feeling of hopelessness, fatigue but denies any suicidal thoughts, plans or any homicidal thoughts.   Past Psychiatric History: Patient reported history of depression and anxiety since age 69 started in [place].  She has at least 2 psychiatric inpatient treatment.  She was admitted at [facility] but she believes having suicidal ideation because of starting Cymbalta.  She has another inpatient in 2015 because of severe depression when find out about her husband's second marriage.  She is seeing Dr. Parke Poisson since then and had prescribed BuSpar, Abilify,  Zoloft and Lamictal.  She also tried Prozac that made her more depressed.  Denies any history of mania, psychosis.    Family History: Multiple family member has psychiatric disorder.  Past Medical History:  Diagnosis Date   ADD (attention deficit disorder)    Anxiety    Arthritis    both knees right worse than left   Carpal tunnel syndrome of  right wrist    Depression    Essential hypertension, benign    Gallstones    History of smoking 06/22/2016   Hyperlipidemia associated with type 2 diabetes mellitus ([place]), on Zocor 04/04/2011   Hypertension associated with diabetes ([place]) 06/03/2008   Migraines    Morbid obesity ([place])    Morbid obesity with BMI of 50.0-59.9, adult ([place]) 07/12/2006   NICM (nonischemic cardiomyopathy) ([place]) 07/12/2006   01/16/18 ECHO:    - Procedure narrative: Transthoracic echocardiography. Image   quality was suboptimal. The study was technically difficult.   Intravenous contrast (Definity) was administered. - Left ventricle: The cavity size was moderately dilated. Wall   thickness was increased in a pattern of mild LVH. Systolic   function was moderately to severely reduced. The estimated   ejection fraction w   Normal coronary arteries 04/17/2016   OSA on CPAP 10/28/2014   Spinal stenosis    back pain   Spondylosis without myelopathy or radiculopathy, lumbar region 12/04/2017   Type 2 diabetes mellitus with hyperglycemia ([place])      Traumatic brain injury: Denies   Work History; Disable  Psychosocial History; Patient born in [place] but lived in [place] most of her life.  She moved to [place] in 2003.  She is married and lives with her husband and 90 year old son who has Asperger.  Patient reported having a cardiomyopathy after her son was born and she was told cannot have another children.  Patient told her husband had a second wife in his native country and had a son who is 5 years old.  Patient is disabled due to her  Legal History; Patient denies any legal issues.  History Of Abuse; Denies  Substance Abuse History; Denies  Neurologic: Headache: No Seizure: No Paresthesias: No   Outpatient Encounter Medications as of 03/31/2021  Medication Sig   ARIPiprazole (ABILIFY) 2 MG tablet Take 1 tablet (2 mg total) by mouth daily. (Patient not taking: Reported on 03/13/2021)   busPIRone (BUSPAR)  15 MG tablet Take 1 tablet (15 mg total) by mouth 2 (two) times daily.   carvedilol (COREG) 12.5 MG tablet Take 1 tablet (12.5 mg total) by mouth 2 (two) times daily with a meal.   carvedilol (COREG) 6.25 MG tablet Take 6.25 mg by mouth 2 (two) times daily with a meal.   Dapagliflozin-metFORMIN HCl ER (XIGDUO XR) 11-998 MG TB24 Take 1 tablet by mouth daily.   fluticasone (FLONASE) 50 MCG/ACT nasal spray SPRAY 2 SPRAYS INTO EACH NOSTRIL EVERY DAY   furosemide (LASIX) 80 MG tablet Take 1 tablet (80 mg total) by mouth 2 (two) times daily.   ketoconazole (NIZORAL) 2 % cream Apply 1 application topically 2 (two) times daily.   LINZESS 145 MCG CAPS capsule TAKE 1 CAPSULE BY MOUTH DAILY BEFORE BREAKFAST.   nystatin (MYCOSTATIN) 100000 UNIT/ML suspension Take 5 mLs (500,000 Units total) by mouth 4 (four) times daily for 10 days.   potassium chloride SA (KLOR-CON M20) 20 MEQ tablet Take 2 tablets (40 mEq total) by mouth daily.   sacubitril-valsartan (ENTRESTO) 97-103 MG Take 1 tablet by  mouth 2 (two) times daily.   sertraline (ZOLOFT) 100 MG tablet Take 1.5 tablets (150 mg total) by mouth daily. (Patient not taking: Reported on 03/13/2021)   simvastatin (ZOCOR) 20 MG tablet Take 1 tablet (20 mg total) by mouth daily.   traMADol (ULTRAM) 50 MG tablet Take 1 tablet (50 mg total) by mouth every 12 (twelve) hours as needed.   No facility-administered encounter medications on file as of 03/31/2021.    Recent Results (from the past 2160 hour(s))  Basic metabolic panel     Status: Abnormal   Collection Time: 01/08/21  2:23 PM  Result Value Ref Range   Glucose 109 (H) 70 - 99 mg/dL   BUN 17 6 - 24 mg/dL   Creatinine, Ser 0.79 0.57 - 1.00 mg/dL   eGFR 88 >59 mL/min/1.73   BUN/Creatinine Ratio 22 9 - 23   Sodium 145 (H) 134 - 144 mmol/L   Potassium 4.1 3.5 - 5.2 mmol/L   Chloride 100 96 - 106 mmol/L   CO2 27 20 - 29 mmol/L   Calcium 9.6 8.7 - 10.2 mg/dL  CBC     Status: Abnormal   Collection Time:  01/08/21  2:23 PM  Result Value Ref Range   WBC 10.2 3.4 - 10.8 x10E3/uL   RBC 5.82 (H) 3.77 - 5.28 x10E6/uL   Hemoglobin 15.9 11.1 - 15.9 g/dL   Hematocrit 48.5 (H) 34.0 - 46.6 %   MCV 83 79 - 97 fL   MCH 27.3 26.6 - 33.0 pg   MCHC 32.8 31.5 - 35.7 g/dL   RDW 14.2 11.7 - 15.4 %   Platelets 275 150 - 450 x10E3/uL  Urinalysis, Routine w reflex microscopic     Status: Abnormal   Collection Time: 03/01/21  3:30 PM  Result Value Ref Range   Color, Urine YELLOW Yellow;Lt. Yellow;Straw;Dark Yellow;Amber;Green;Red;Brown   APPearance Turbid (A) Clear;Turbid;Slightly Cloudy;Cloudy   Specific Gravity, Urine 1.025 1.000 - 1.030   pH 5.5 5.0 - 8.0   Total Protein, Urine NEGATIVE Negative   Urine Glucose 500 (A) Negative   Ketones, ur NEGATIVE Negative   Bilirubin Urine NEGATIVE Negative   Hgb urine dipstick NEGATIVE Negative   Urobilinogen, UA 0.2 0.0 - 1.0   Leukocytes,Ua NEGATIVE Negative   Nitrite NEGATIVE Negative   WBC, UA 0-2/hpf 0-2/hpf   RBC / HPF none seen 0-2/hpf   Squamous Epithelial / LPF Rare(0-4/hpf) Rare(0-4/hpf)   Uric Acid Crys, UA Presence of (A) None   Amorphous Present (A) None;Present  Hepatic function panel     Status: None   Collection Time: 03/01/21  3:30 PM  Result Value Ref Range   Total Bilirubin 0.8 0.2 - 1.2 mg/dL   Bilirubin, Direct 0.2 0.0 - 0.3 mg/dL   Alkaline Phosphatase 44 39 - 117 U/L   AST 13 0 - 37 U/L   ALT 15 0 - 35 U/L   Total Protein 7.6 6.0 - 8.3 g/dL   Albumin 3.9 3.5 - 5.2 g/dL  Hemoglobin A1c     Status: Abnormal   Collection Time: 03/01/21  3:30 PM  Result Value Ref Range   Hgb A1c MFr Bld 6.9 (H) 4.6 - 6.5 %    Comment: Glycemic Control Guidelines for People with Diabetes:Non Diabetic:  <6%Goal of Therapy: <7%Additional Action Suggested:  >8%   Microalbumin / creatinine urine ratio     Status: None   Collection Time: 03/01/21  3:30 PM  Result Value Ref Range   Microalb, Ur 1.6 0.0 -  1.9 mg/dL   Creatinine,U 102.8 mg/dL   Microalb  Creat Ratio 1.6 0.0 - 30.0 mg/g  Lipid panel     Status: None   Collection Time: 03/01/21  3:30 PM  Result Value Ref Range   Cholesterol 176 0 - 200 mg/dL    Comment: ATP III Classification       Desirable:  < 200 mg/dL               Borderline High:  200 - 239 mg/dL          High:  > = 240 mg/dL   Triglycerides 110.0 0.0 - 149.0 mg/dL    Comment: Normal:  <150 mg/dLBorderline High:  150 - 199 mg/dL   HDL 67.60 >39.00 mg/dL   VLDL 22.0 0.0 - 40.0 mg/dL   LDL Cholesterol 86 0 - 99 mg/dL   Total CHOL/HDL Ratio 3     Comment:                Men          Women1/2 Average Risk     3.4          3.3Average Risk          5.0          4.42X Average Risk          9.6          7.13X Average Risk          15.0          11.0                       NonHDL 108.49     Comment: NOTE:  Non-HDL goal should be 30 mg/dL higher than patient's LDL goal (i.e. LDL goal of < 70 mg/dL, would have non-HDL goal of < 100 mg/dL)      Constitutional:  Wt (!) 319 lb (144.7 kg)    BMI 54.76 kg/m    Musculoskeletal: Strength & Muscle Tone: within normal limits Gait & Station: normal Patient leans: N/A  Psychiatric Specialty Exam: Physical Exam  ROS  Weight (!) 319 lb (144.7 kg).Body mass index is 54.76 kg/m.  General Appearance: NA  Eye Contact:  NA  Speech:  Slow  Volume:  Normal  Mood:  Anxious, Depressed, Dysphoric, Hopeless, and Irritable  Affect:  NA  Thought Process:  Descriptions of Associations: Intact  Orientation:  Full (Time, Place, and Person)  Thought Content:  Rumination  Suicidal Thoughts:  No  Homicidal Thoughts:  No  Memory:  Immediate;   Good Recent;   Good Remote;   Fair  Judgement:  Fair  Insight:  Shallow  Psychomotor Activity:  NA  Concentration:  Concentration: Fair and Attention Span: Fair  Recall:  [employer] of Knowledge:  Fair  Language:  Good  Akathisia:  No  Handed:  Right  AIMS (if indicated):     Assets:  Communication Skills Desire for Improvement Social  Support Transportation  ADL's:  Intact  Cognition:  WNL  Sleep:   more in day time and less at night     Assessment/Plan: Patient is 56 year old female with history of depression, anxiety currently not taking medication for more than 7 months with exacerbation of symptoms.  Her PCP recommended to see her again psychiatrist.  She is a patient of Dr. Parke Poisson.  I reviewed previous notes, history, current medication, blood work results in detail.  I talked to  her about prior medication response and treatment.  So far patient has tried Abilify, BuSpar, Zoloft, Lamictal, Klonopin and Prozac.  We talk about trying to get first genesight testing as patient reluctant to try a new medication given the history of prior side effects.  Patient agreed with the plan.  She will come in the office on Monday to provide a sample for testing.  I will see her in office in 2 weeks and we will consider starting a new medication once we have the results available.  Patient agreed with the plan.  I discussed if she started to have worsening of symptoms or having any suicidal thoughts or homicidal thought that she need to call 911 or go to local emergency room.  Follow-up in 2 weeks.  No new medications given at this visit.    Kathlee Nations, MD 03/31/2021   Follow Up Instructions:    I discussed the assessment and treatment plan with the patient. The patient was provided an opportunity to ask questions and all were answered. The patient agreed with the plan and demonstrated an understanding of the instructions.   The patient was advised to call back or seek an in-person evaluation if the symptoms worsen or if the condition fails to improve as anticipated.  I provided 61 minutes of non-face-to-face time during this encounter.   Kathlee Nations, MD

## 2021-04-02 ENCOUNTER — Encounter: Payer: Self-pay | Admitting: Physician Assistant

## 2021-04-02 ENCOUNTER — Other Ambulatory Visit: Payer: Self-pay

## 2021-04-02 ENCOUNTER — Ambulatory Visit (INDEPENDENT_AMBULATORY_CARE_PROVIDER_SITE_OTHER): Payer: Medicare Other | Admitting: Physician Assistant

## 2021-04-02 VITALS — BP 116/78 | HR 71 | Ht 64.0 in | Wt 326.2 lb

## 2021-04-02 DIAGNOSIS — I5022 Chronic systolic (congestive) heart failure: Secondary | ICD-10-CM | POA: Diagnosis not present

## 2021-04-02 DIAGNOSIS — F332 Major depressive disorder, recurrent severe without psychotic features: Secondary | ICD-10-CM

## 2021-04-02 DIAGNOSIS — E119 Type 2 diabetes mellitus without complications: Secondary | ICD-10-CM | POA: Diagnosis not present

## 2021-04-02 DIAGNOSIS — I10 Essential (primary) hypertension: Secondary | ICD-10-CM

## 2021-04-02 DIAGNOSIS — Z79899 Other long term (current) drug therapy: Secondary | ICD-10-CM | POA: Diagnosis not present

## 2021-04-02 DIAGNOSIS — F419 Anxiety disorder, unspecified: Secondary | ICD-10-CM | POA: Diagnosis not present

## 2021-04-02 DIAGNOSIS — E785 Hyperlipidemia, unspecified: Secondary | ICD-10-CM | POA: Diagnosis not present

## 2021-04-02 DIAGNOSIS — Z01810 Encounter for preprocedural cardiovascular examination: Secondary | ICD-10-CM

## 2021-04-02 MED ORDER — FUROSEMIDE 80 MG PO TABS: 80.0000 mg | ORAL_TABLET | Freq: Every day | ORAL | 3 refills | Status: DC

## 2021-04-02 MED ORDER — SACUBITRIL-VALSARTAN 97-103 MG PO TABS: 1.0000 | ORAL_TABLET | Freq: Two times a day (BID) | ORAL | 3 refills | Status: DC

## 2021-04-02 MED ORDER — CARVEDILOL 12.5 MG PO TABS: 12.5000 mg | ORAL_TABLET | Freq: Two times a day (BID) | ORAL | 1 refills | Status: DC

## 2021-04-02 MED ORDER — POTASSIUM CHLORIDE CRYS ER 20 MEQ PO TBCR: 40.0000 meq | EXTENDED_RELEASE_TABLET | Freq: Every day | ORAL | 3 refills | Status: DC

## 2021-04-02 MED ORDER — CARVEDILOL 12.5 MG PO TABS: 12.5000 mg | ORAL_TABLET | Freq: Two times a day (BID) | ORAL | 3 refills | Status: DC

## 2021-04-02 NOTE — Progress Notes (Signed)
Cardiology Office Note:    Date:  04/04/2021   ID:  [name], DOB 08/08/1965, MRN [id number]  PCP:  [name], MD   [facility] Providers Cardiologist:  Minus Breeding, MD     Referring MD: [name], MD   Chief Complaint  Patient presents with   Shortness of Breath    When walking, not a new issue    History of Present Illness:    [name] is a 56 y.o. female with a hx of ADD, depression, HTN, HLD, DM II, morbid obesity, obstructive sleep apnea on CPAP therapy and a history of nonischemic cardiomyopathy.  Cardiac catheterization in February 2018 revealed normal coronary arteries, EF 25 to 30%, mild pulmonary hypertension, elevated LVEDP.  She had acute on chronic systolic and diastolic heart failure in August 2018.  Repeat echocardiogram in November 2019 showed EF 30 to 35%.  Most recent echocardiogram in November 2021 showed EF has improved to 50 to 55%.  Patient was seen by Dr. Percival Spanish on 06/19/2020, it seems she has been dealing with depression and stopped all her medication for over 2 months, the medication has been restarted at lower dose.  She was still complaining of dyspnea, Entresto was increased to 49-51 mg twice a day. Since Entresto was increased, her breathing has improved.  Her chest x-ray showed no acute finding.  I will hold off on pulmonology referral.  I suspect her shortness of breath is due to combination of morbid obesity and history of heart failure. She always has slightly worsening left lower extremity edema compared to the right side.  Previous venous Doppler in 2019 was negative for DVT.  She was last seen by Dr. Percival Spanish around 12/29/2020, [name] Loll has been increased to 97/103 mg twice a day.  Patient presents today for follow-up.  She is still dealing with significant depression and has recently switched behavioral health provider.  She says she inquired about water aerobic activity last week however never went back for further sessions.  She  denies any chest pain.  Her breathing seems to be stable however she does have dyspnea on more strenuous activity.  Her dyspnea on exertion is likely related to her body habitus.  However she has back pain that prevent her from doing too much activity.  I really think what aerobic activity is the best choice for her.  Unfortunately she has not been going out too much due to depression.  She has a lot of confusion about her medication list.  I called her pharmacy who says she is no longer on carvedilol, Lasix and potassium.  The last prescription was from 2021.  Patient says she is quite sure that she is taking all of them.  She suspect that she self discontinued all medication for a while due to depression and then now she is back taking some to her old medications.  I asked her to verify the home doses of her medication.  I recommended continue carvedilol to 12.5 mg twice a day.  She used to be on 25 mg of carvedilol in the past, however I am hesitant to make her retake 25 mg immediately.  She also mentions due to her depression, she has not taken any of her medication in the afternoon.  Therefore she is essentially daily dosing of Lasix instead of twice a day.  On physical exam, she appears to be euvolemic on the daily dosing of Lasix.  I will keep her Lasix at 80 mg once a  day instead of twice a day.  However I emphasized on the importance of her taking carvedilol and Entresto twice a day.  Because of all the recent medication adjustment, I asked her to repeat a basic metabolic panel in 2 to 3 weeks.  She likely will require epidural back injection at some point, if so, she is cleared to proceed.  Otherwise, she can follow-up with Dr. Percival Spanish in 3 months.   Past Medical History:  Diagnosis Date   ADD (attention deficit disorder)    Anxiety    Arthritis    both knees right worse than left   Carpal tunnel syndrome of right wrist    Depression    Essential hypertension, benign    Gallstones    History  of smoking 06/22/2016   Hyperlipidemia associated with type 2 diabetes mellitus ([place]), on Zocor 04/04/2011   Hypertension associated with diabetes ([place]) 06/03/2008   Migraines    Morbid obesity ([place])    Morbid obesity with BMI of 50.0-59.9, adult ([place]) 07/12/2006   NICM (nonischemic cardiomyopathy) ([place]) 07/12/2006   01/16/18 ECHO:    - Procedure narrative: Transthoracic echocardiography. Image   quality was suboptimal. The study was technically difficult.   Intravenous contrast (Definity) was administered. - Left ventricle: The cavity size was moderately dilated. Wall   thickness was increased in a pattern of mild LVH. Systolic   function was moderately to severely reduced. The estimated   ejection fraction w   Normal coronary arteries 04/17/2016   OSA on CPAP 10/28/2014   Spinal stenosis    back pain   Spondylosis without myelopathy or radiculopathy, lumbar region 12/04/2017   Type 2 diabetes mellitus with hyperglycemia [facility])     Past Surgical History:  Procedure Laterality Date   CHOLECYSTECTOMY     DILATATION & CURETTAGE/HYSTEROSCOPY WITH MYOSURE N/A 10/31/2017   Procedure: DILATATION & CURETTAGE/HYSTEROSCOPY WITH MYOSURE;  Surgeon: Salvadore Dom, MD;  Location: [facility];  Service: Gynecology;  Laterality: N/A;   RIGHT/LEFT HEART CATH AND CORONARY ANGIOGRAPHY N/A 04/11/2016   Procedure: Right/Left Heart Cath and Coronary Angiography;  Surgeon: Burnell Blanks, MD;  Location: [facility];  Service: Cardiovascular;  Laterality: N/A;   sonogram for blood clots     no blockages    Current Medications: Current Meds  Medication Sig   Dapagliflozin-metFORMIN HCl ER (XIGDUO XR) 11-998 MG TB24 Take 1 tablet by mouth daily.   LINZESS 145 MCG CAPS capsule TAKE 1 CAPSULE BY MOUTH DAILY BEFORE BREAKFAST.   simvastatin (ZOCOR) 20 MG tablet Take 1 tablet (20 mg total) by mouth daily.   [DISCONTINUED] carvedilol (COREG) 12.5 MG tablet Take 1 tablet (12.5 mg total) by mouth 2 (two) times  daily with a meal.   [DISCONTINUED] carvedilol (COREG) 6.25 MG tablet Take 6.25 mg by mouth 2 (two) times daily with a meal.   [DISCONTINUED] furosemide (LASIX) 80 MG tablet Take 1 tablet (80 mg total) by mouth 2 (two) times daily.   [DISCONTINUED] potassium chloride SA (KLOR-CON M20) 20 MEQ tablet Take 2 tablets (40 mEq total) by mouth daily.   [DISCONTINUED] sacubitril-valsartan (ENTRESTO) 97-103 MG Take 1 tablet by mouth 2 (two) times daily.     Allergies:   Fluoxetine, Cymbalta [duloxetine hcl], and Penicillins   Social History   Socioeconomic History   Marital status: Married    Spouse name: Not on file   Number of children: Not on file   Years of education: Not on file   Highest education level:  Not on file  Occupational History   Not on file  Tobacco Use   Smoking status: Former    Packs/day: 0.50    Types: Cigarettes    Quit date: 02/25/1997    Years since quitting: 24.1   Smokeless tobacco: Never   Tobacco comments:    Married, lives with spouse (when he is not traveling) and son  Vaping Use   Vaping Use: Never used  Substance and Sexual Activity   Alcohol use: No   Drug use: No   Sexual activity: Yes    Partners: Male    Birth control/protection: None  Other Topics Concern   Not on file  Social History Narrative   Not on file   Social Determinants of Health   Financial Resource Strain: Medium Risk   Difficulty of Paying Living Expenses: Somewhat hard  Food Insecurity: Food Insecurity Present   Worried About Running Out of Food in the Last Year: Sometimes true   Ran Out of Food in the Last Year: Never true  Transportation Needs: No Transportation Needs   Lack of Transportation (Medical): No   Lack of Transportation (Non-Medical): No  Physical Activity: Inactive   Days of Exercise per Week: 0 days   Minutes of Exercise per Session: 0 min  Stress: No Stress Concern Present   Feeling of Stress : Not at all  Social Connections: Moderately Isolated    Frequency of Communication with Friends and Family: More than three times a week   Frequency of Social Gatherings with Friends and Family: Once a week   Attends Religious Services: Never   Marine scientist or Organizations: No   Attends Music therapist: Never   Marital Status: Married     Family History: The patient's family history includes ADD / ADHD in her father; Alcohol abuse in an other family member; Anxiety disorder in her mother; Colitis in an other family member; Depression in her mother; Diabetes in her father, mother, and another family member; Hypertension in her father, mother, and another family member.  ROS:   Please see the history of present illness.     All other systems reviewed and are negative.  EKGs/Labs/Other Studies Reviewed:    The following studies were reviewed today:  Echo 12/30/2018  1. Left ventricular ejection fraction, by visual estimation, is 50 to  55%. The left ventricle has low normal function. There is no left  ventricular hypertrophy.   2. Mildly dilated left ventricular internal cavity size.   3. Inferior basal wall hypokinesis      Abnormal GLS -12.2.   4. Global right ventricle has normal systolic function.The right  ventricular size is normal. No increase in right ventricular wall  thickness.   5. Left atrial size was moderately dilated.   6. Right atrial size was normal.   7. The mitral valve is normal in structure. No evidence of mitral valve  regurgitation. No evidence of mitral stenosis.   8. The tricuspid valve is normal in structure. Tricuspid valve  regurgitation is trivial.   9. The aortic valve is normal in structure. Aortic valve regurgitation is  not visualized. Mild aortic valve sclerosis without stenosis.  10. The pulmonic valve was grossly normal. Pulmonic valve regurgitation is  trivial.  11. Normal pulmonary artery systolic pressure.  12. The inferior vena cava is normal in size with greater than 50%   respiratory variability, suggesting right atrial pressure of 3 mmHg.   EKG:  EKG is not ordered  today.    Recent Labs: 06/15/2020: TSH 4.260 06/19/2020: BNP 106.5 01/08/2021: BUN 17; Creatinine, Ser 0.79; Hemoglobin 15.9; Platelets 275; Potassium 4.1; Sodium 145 03/01/2021: ALT 15  Recent Lipid Panel    Component Value Date/Time   CHOL 176 03/01/2021 1530   TRIG 110.0 03/01/2021 1530   HDL 67.60 03/01/2021 1530   CHOLHDL 3 03/01/2021 1530   VLDL 22.0 03/01/2021 1530   LDLCALC 86 03/01/2021 1530   LDLDIRECT 143.8 04/03/2011 0929     Risk Assessment/Calculations:           Physical Exam:    VS:  BP 116/78    Pulse 71    Ht 5\' 4"  (1.626 m)    Wt (!) 326 lb 3.2 oz (148 kg)    SpO2 97%    BMI 55.99 kg/m     Wt Readings from Last 3 Encounters:  04/02/21 (!) 326 lb 3.2 oz (148 kg)  03/01/21 (!) 319 lb (144.7 kg)  02/12/21 (!) 323 lb (146.5 kg)     GEN:  Well nourished, well developed in no acute distress HEENT: Normal NECK: No JVD; No carotid bruits LYMPHATICS: No lymphadenopathy CARDIAC: RRR, no murmurs, rubs, gallops RESPIRATORY:  Clear to auscultation without rales, wheezing or rhonchi  ABDOMEN: Soft, non-tender, non-distended MUSCULOSKELETAL:  No edema; No deformity  SKIN: Warm and dry NEUROLOGIC:  Alert and oriented x 3 PSYCHIATRIC:  Normal affect   ASSESSMENT:    1. Chronic systolic CHF (congestive heart failure), NYHA class 3 ([place])   2. Medication management   3. Severe episode of recurrent major depressive disorder, without psychotic features ([place])   4. Essential hypertension   5. Hyperlipidemia LDL goal <70   6. Controlled type 2 diabetes mellitus without complication, without long-term current use of insulin ([place])   7. Preop cardiovascular exam    PLAN:    In order of problems listed above:  Chronic systolic heart failure: Continue carvedilol 12.5 mg twice a day and Entresto.  I called her pharmacy, she has not picked up any recent prescriptions from  her pharmacy.  Patient is under the impression that she is taking some of her old medications which she previously has held due to depression.  She will need a basic metabolic panel in few weeks to reassess her renal function and electrolytes  Medication management: See #1.  Continue carvedilol and Entresto.  Obtain basic metabolic panel  Major depression: This continue to be the primary issue that interfere with her compliance.  Hypertension: Blood pressure stable  Hyperlipidemia: On Zocor  DM2: Managed by primary care provider  Preoperative clearance: She is due for upcoming back injection.  She is cleared to proceed.           Medication Adjustments/Labs and Tests Ordered: Current medicines are reviewed at length with the patient today.  Concerns regarding medicines are outlined above.  Orders Placed This Encounter  Procedures   Basic metabolic panel   Meds ordered this encounter  Medications   DISCONTD: carvedilol (COREG) 12.5 MG tablet    Sig: Take 1 tablet (12.5 mg total) by mouth 2 (two) times daily with a meal.    Dispense:  180 tablet    Refill:  1   DISCONTD: sacubitril-valsartan (ENTRESTO) 97-103 MG    Sig: Take 1 tablet by mouth 2 (two) times daily.    Dispense:  60 tablet    Refill:  3   carvedilol (COREG) 12.5 MG tablet    Sig: Take 1 tablet (12.5  mg total) by mouth 2 (two) times daily with a meal.    Dispense:  180 tablet    Refill:  3   DISCONTD: sacubitril-valsartan (ENTRESTO) 97-103 MG    Sig: Take 1 tablet by mouth 2 (two) times daily.    Dispense:  60 tablet    Refill:  3   potassium chloride SA (KLOR-CON M20) 20 MEQ tablet    Sig: Take 2 tablets (40 mEq total) by mouth daily.    Dispense:  180 tablet    Refill:  3   furosemide (LASIX) 80 MG tablet    Sig: Take 1 tablet (80 mg total) by mouth daily.    Dispense:  90 tablet    Refill:  3   sacubitril-valsartan (ENTRESTO) 97-103 MG    Sig: Take 1 tablet by mouth 2 (two) times daily.    Dispense:   180 tablet    Refill:  3    Patient Instructions  Medication Instructions:  Stop Entresto 49-51 and take Entresto 97-103(refill sent to pharmacy)  *If you need a refill on your cardiac medications before your next appointment, please call your pharmacy*   Lab Work: Your physician recommends that you return for lab work in 2-3 weeks BMET  Testing/Procedures: NONE ordered at this time of appointment    Follow-Up: At [facility], you and your health needs are our priority.  As part of our continuing mission to provide you with exceptional heart care, we have created designated Provider Care Teams.  These Care Teams include your primary Cardiologist (physician) and Advanced Practice Providers (APPs -  Physician Assistants and Nurse Practitioners) who all work together to provide you with the care you need, when you need it.  We recommend signing up for the patient portal called "MyChart".  Sign up information is provided on this After Visit Summary.  MyChart is used to connect with patients for Virtual Visits (Telemedicine).  Patients are able to view lab/test results, encounter notes, upcoming appointments, etc.  Non-urgent messages can be sent to your provider as well.   To learn more about what you can do with MyChart, go to [email or web address].    Your next appointment:   3 month(s)  The format for your next appointment:   In Person  Provider:   Minus Breeding, MD     Other Instructions     Signed, Almyra Deforest, [place]  04/04/2021 11:17 PM    [place]

## 2021-04-02 NOTE — Patient Instructions (Addendum)
Medication Instructions:  Stop Entresto 49-51 and take Entresto 97-103(refill sent to pharmacy)  *If you need a refill on your cardiac medications before your next appointment, please call your pharmacy*   Lab Work: Your physician recommends that you return for lab work in 2-3 weeks BMET  Testing/Procedures: NONE ordered at this time of appointment    Follow-Up: At [facility], you and your health needs are our priority.  As part of our continuing mission to provide you with exceptional heart care, we have created designated Provider Care Teams.  These Care Teams include your primary Cardiologist (physician) and Advanced Practice Providers (APPs -  Physician Assistants and Nurse Practitioners) who all work together to provide you with the care you need, when you need it.  We recommend signing up for the patient portal called "MyChart".  Sign up information is provided on this After Visit Summary.  MyChart is used to connect with patients for Virtual Visits (Telemedicine).  Patients are able to view lab/test results, encounter notes, upcoming appointments, etc.  Non-urgent messages can be sent to your provider as well.   To learn more about what you can do with MyChart, go to [email or web address].    Your next appointment:   3 month(s)  The format for your next appointment:   In Person  Provider:   Minus Breeding, MD     Other Instructions

## 2021-04-04 ENCOUNTER — Telehealth: Payer: Medicare Other

## 2021-04-04 ENCOUNTER — Encounter: Payer: Self-pay | Admitting: Physician Assistant

## 2021-04-04 DIAGNOSIS — E119 Type 2 diabetes mellitus without complications: Secondary | ICD-10-CM | POA: Diagnosis not present

## 2021-04-04 DIAGNOSIS — H524 Presbyopia: Secondary | ICD-10-CM | POA: Diagnosis not present

## 2021-04-04 DIAGNOSIS — H5203 Hypermetropia, bilateral: Secondary | ICD-10-CM | POA: Diagnosis not present

## 2021-04-04 LAB — HM DIABETES EYE EXAM

## 2021-04-09 ENCOUNTER — Telehealth: Payer: Self-pay

## 2021-04-09 ENCOUNTER — Other Ambulatory Visit: Payer: Self-pay

## 2021-04-09 ENCOUNTER — Ambulatory Visit (INDEPENDENT_AMBULATORY_CARE_PROVIDER_SITE_OTHER): Payer: Medicare Other | Admitting: Licensed Clinical Social Worker

## 2021-04-09 DIAGNOSIS — F3341 Major depressive disorder, recurrent, in partial remission: Secondary | ICD-10-CM

## 2021-04-09 DIAGNOSIS — I119 Hypertensive heart disease without heart failure: Secondary | ICD-10-CM

## 2021-04-09 DIAGNOSIS — F902 Attention-deficit hyperactivity disorder, combined type: Secondary | ICD-10-CM

## 2021-04-09 NOTE — Patient Instructions (Addendum)
Visit Information  Thank you for taking time to visit with me today. Please don't hesitate to contact me if I can be of assistance to you before our next scheduled telephone appointment.  Following are the goals we discussed today: Mental heath treatment  Keep appointment with psychiatry on Feb. 20th Our next appointment is by telephone on March 6th at 1:15  Please call the care guide team at [phone] if you need to cancel or reschedule your appointment.   If you are experiencing a Mental Health or [place] or need someone to talk to, please call the Suicide and Crisis Lifeline: 988 call the [place] National Suicide Prevention Lifeline: 504-012-9994 or TTY: (209)305-2349 TTY 856-330-6703) to talk to a trained counselor call 1-800-273-TALK (toll free, 24 hour hotline) go to [facility] [address] [place] [phone]) call 911   Patient verbalizes understanding of instructions and care plan provided today and agrees to view in [place]. Active MyChart status confirmed with patient.    [name], LCSW Licensed Clinical Social Worker [facility]  [place]  [phone]

## 2021-04-09 NOTE — Chronic Care Management (AMB) (Signed)
Chronic Care Management   Clinical Social Work Note  04/09/2021 Name: [name] MRN: [id number] DOB: 06/09/1965  [name] is a 56 y.o. year old female who is a primary care patient of [name], MD. The CCM team was consulted to assist the patient with chronic disease management and/or care coordination needs related to: Mental Health Counseling and Resources.   Engaged with patient by telephone for follow up visit in response to provider referral for social work chronic care management and care coordination services.   Consent to Services:  The patient was given information about Chronic Care Management services, agreed to services, and gave verbal consent prior to initiation of services.  Please see initial visit note for detailed documentation.  Patient agreed to services and consent obtained.   Summary: Assessed patient's current treatment, progress, coping skills, support system and barriers to care.  She is making progress with and has connected with a mental health provider . Shared other concerns related to delay in getting rolling walker and provider not sending in Rx for Nystop powder to put under her breast. See Care Plan below for interventions and patient self-care actives.  Recommendation: Patient may benefit from, and is in agreement for LCSW to send [facility] calendar to help with keeping all upcoming appointments; and  writing down task centered iteams discussed today ( dentist, CPAP follow up, call [facility]; dermatology appointment) .   Follow up Plan: Patient would like continued follow-up from CCM LCSW.  per patient's request will follow up in 3 weeks.  Will call office if needed prior to next encounter.   Assessment: Review of patient past medical history, allergies, medications, and health status, including review of relevant consultants reports was performed today as part of a comprehensive evaluation and provision of chronic care management and care coordination  services.     SDOH (Social Determinants of Health) assessments and interventions performed:  SDOH Interventions    Flowsheet Row Most Recent Value  SDOH Interventions   Stress Interventions Provide Counseling        Advanced Directives Status: Not addressed in this encounter.  CCM Care Plan  Conditions to be addressed/monitored: Anxiety and Depression; Mental Health Concerns   Care Plan : LCSW Plan of Care  Updates made by Maurine Cane, LCSW since 04/09/2021 12:00 AM     Problem: Symptoms of Depression not managed      Goal: Symptoms Monitored and Managed with medication and therapy   Start Date: 03/20/2021  Expected End Date: 06/24/2021  This Visit's Progress: On track  Recent Progress: On track  Priority: High  Note:   Current Barriers:  Disease Management support and education needs related to Depression: depressed mood loss of energy/fatigue difficulty concentrating Unable to locate a provider that will take insurance  CSW Clinical Goal(s):  Patient  will work with mental health provider and LCSW to address needs related to symptoms of depression  through collaboration with Clinical Education officer, museum, provider, and care team.   Interventions: 1:1 collaboration with primary care provider regarding development and update of comprehensive plan of care as evidenced by provider attestation and co-signature Inter-disciplinary care team collaboration (see longitudinal plan of care) Evaluation of current treatment plan related to  self management and patient's adherence to plan as established by provider  Mental Health:  (Status: Goal on Track (progressing): YES.) Evaluation of current treatment plan related to ADHD, Mood Instability, and symptoms of depression  Solution-Focused Strategies employed:  Active listening / Reflection utilized  Emotional Support Provided Problem [place] strategies reviewed Provided psychoeducation for mental health needs  Crisis  Resource Education / information provided  Expressed other health related concerns will share with CCM RN ( needs rolling walker; Nystop powder for breast; dentist ; appointments for breast center; and dermatology appointment)  Patient Self-Care Activities: Keep appointment with psychiatry on Feb. 20th  Utilize the information below if you have a mental health crisis [facility]  [address] [place], [place]  Crisis [place], [place] Licensed Clinical Social Worker [facility]  [place]  [phone]

## 2021-04-09 NOTE — Telephone Encounter (Signed)
° °  Telephone encounter was:  Successful.  04/09/2021 Name: [name] MRN: [id number] DOB: January 06, 1966  [name] is a 56 y.o. year old female who is a primary care patient of [name], MD . The community resource team was consulted for assistance with Food Insecurity, Financial Difficulties related to financial strain, and dental assistance  Care guide performed the following interventions: Patient provided with information about care guide support team and interviewed to confirm resource needs.[name] called me 2/13 needing assistance with a dental provider, food pantries, fresh foods and community care clinics  Follow Up Plan:  Care guide will follow up with patient by phone over the next week    [name], Care Management  [phone] [address], [place], [place] [address] Phone: [phone] Email: [email or web address]

## 2021-04-10 ENCOUNTER — Telehealth: Payer: Self-pay

## 2021-04-10 ENCOUNTER — Other Ambulatory Visit: Payer: Self-pay | Admitting: Internal Medicine

## 2021-04-10 ENCOUNTER — Ambulatory Visit: Payer: Medicare Other | Admitting: *Deleted

## 2021-04-10 DIAGNOSIS — I5022 Chronic systolic (congestive) heart failure: Secondary | ICD-10-CM

## 2021-04-10 DIAGNOSIS — E119 Type 2 diabetes mellitus without complications: Secondary | ICD-10-CM

## 2021-04-10 MED ORDER — NYSTATIN 100000 UNIT/GM EX POWD: 1.0000 "application " | Freq: Three times a day (TID) | CUTANEOUS | 3 refills | Status: DC

## 2021-04-10 NOTE — Telephone Encounter (Signed)
Pt has been informed that Rx for nystatin powder has been sent by Dr. Sharlet Salina in PCPs absence and pt has been informed that I have followed up with the [place] team in regard to order that was placed for DME rolling walker on 1/18. I will reach back out to pt once I have a response from [employer] team per [facility]. She expressed understanding.

## 2021-04-10 NOTE — Chronic Care Management (AMB) (Signed)
Chronic Care Management   CCM RN Visit Note  04/10/2021 Name: [name] MRN: [id number] DOB: 1965/08/01  Subjective: [name] is a 56 y.o. year old female who is a primary care patient of [name], MD. The care management team was consulted for assistance with disease management and care coordination needs.    Engaged with patient by telephone for  acute/ unscheduled outreach  in response to provider referral for case management and/or care coordination services.   Consent to Services:  The patient was given information about Chronic Care Management services, agreed to services, and gave verbal consent prior to initiation of services.  Please see initial visit note for detailed documentation.  Patient agreed to services and verbal consent obtained.   Assessment: Review of patient past medical history, allergies, medications, health status, including review of consultants reports, laboratory and other test data, was performed as part of comprehensive evaluation and provision of chronic care management services.   CCM Care Plan  Allergies  Allergen Reactions   Fluoxetine Other (See Comments)    More depressed   Cymbalta [Duloxetine Hcl] Other (See Comments)    depressed   Penicillins     REACTION: rash Has patient had a PCN reaction causing immediate rash, facial/tongue/throat swelling, SOB or lightheadedness with hypotension: YES Has patient had a PCN reaction causing severe rash involving mucus membranes or skin necrosis: NO Has patient had a PCN reaction that required hospitalization: YES Has patient had a PCN reaction occurring within the last 10 years: NO If all of the above answers are "NO", then may proceed with Cephalosporin use.    Outpatient Encounter Medications as of 04/10/2021  Medication Sig   carvedilol (COREG) 12.5 MG tablet Take 1 tablet (12.5 mg total) by mouth 2 (two) times daily with a meal.   Dapagliflozin-metFORMIN HCl ER (XIGDUO XR) 11-998 MG  TB24 Take 1 tablet by mouth daily.   furosemide (LASIX) 80 MG tablet Take 1 tablet (80 mg total) by mouth daily.   LINZESS 145 MCG CAPS capsule TAKE 1 CAPSULE BY MOUTH DAILY BEFORE BREAKFAST.   potassium chloride SA (KLOR-CON M20) 20 MEQ tablet Take 2 tablets (40 mEq total) by mouth daily.   sacubitril-valsartan (ENTRESTO) 97-103 MG Take 1 tablet by mouth 2 (two) times daily.   simvastatin (ZOCOR) 20 MG tablet Take 1 tablet (20 mg total) by mouth daily.   No facility-administered encounter medications on file as of 04/10/2021.   Patient Active Problem List   Diagnosis Date Noted   Tinea corporis 03/14/2021   Hyperlipidemia LDL goal <100 03/01/2021   DDD (degenerative disc disease), lumbar 08/08/2020   Non-seasonal allergic rhinitis due to pollen 07/25/2020   Chronic idiopathic constipation 07/25/2020   Visit for screening mammogram 02/11/2020   Chronic bilateral low back pain without sciatica 02/10/2020   Spondylosis without myelopathy or radiculopathy, lumbar region 12/04/2017   Type 2 diabetes mellitus with hyperglycemia (HCC)    Normal coronary arteries 04/17/2016   OSA on CPAP 10/28/2014   Major depressive disorder 45/62/5638   Chronic systolic CHF (congestive heart failure), NYHA class 3 ([place]) 12/06/2013   Hyperlipidemia associated with type 2 diabetes mellitus ([place]), on Zocor 04/04/2011   Abnormal electrocardiography 01/16/2009   Hypertension associated with diabetes ([place]) 06/03/2008   Morbid obesity with BMI of 50.0-59.9, adult ([place]) 07/12/2006   NICM (nonischemic cardiomyopathy) ([place]) 07/12/2006   Attention deficit disorder 07/12/2006   Migraine without status migrainosus, not intractable 07/12/2006   Conditions to be addressed/monitored:  CHF  and DMII  Care Plan : RN Care Manager Plan of Care  Updates made by Knox Royalty, RN since 04/10/2021 12:00 AM     Problem: Chronic Disease Management Needs   Priority: High     Long-Range Goal: Development of Plan of Care for  long term chronic disease management   Start Date: 03/13/2021  Expected End Date: 03/13/2022  Priority: High  Note:   Current Barriers:  Chronic Disease Management support and education needs related to CHF, DMII, and severe depression Chronic back pain: reports "9/10" chronic back on daily basis: states ongoing pain prevents her from going upstairs to sleep in bedroom, she is currently sleeping downstairs; reports chronic pain makes her unable to do iADL's and her husband is essentially her caregiver for both ADL's and iADL's; she declines referral for pain management provider; states she rarely takes medications for pain; patient also reports that in the past PCP has referred her to outpatient rehabilitation/ PT, however, she never followed up because she is "so immobilized by depression and pain" that she could "never bring herself" to call and schedule with rehabilitation team-- she is currently not interested in another outpatient rehabilitation referral Ongoing depression- reports long-standing history depression in her family and herself; reports she had psychiatric provider that she "liked" very much, however, she did not feel that she was making progress in therapy with previous psychiatric provider: states she did not believe the medications provider prescribed was helping, and "all he wanted to do was to increase the doses;" today, 03/13/21- she confirms that she is not under psychiatric care and that she is NOT taking prescribed medications for depression Self- acknowledges lack of motivation around state of health: she attributes this to both her ongoing sever depression and her chronic back pain Need for resources for affordable dental services and for food acquisition for FRESH food only: has tried other food pantries, but "they are all unhealthy food choices/ canned" food, which patient is not interested in: [place] referral placed Need for DME: reports she would like a  rollator walker, "for a large person:" states she asked PCP about this at time of PCP office visit and has not yet heard anything; I will reach out to PCP to re-request on patient's behalf 04/10/21: second request for requested DME placed with PCP office team  RNCM Clinical Goal(s):  Patient will demonstrate improved health management independence as evidenced by adherence to plan of care for depression, CHF, and DMII          through collaboration with RN Care manager, provider, and care team.   Interventions: 1:1 collaboration with primary care provider regarding development and update of comprehensive plan of care as evidenced by provider attestation and co-signature Inter-disciplinary care team collaboration (see longitudinal plan of care) Evaluation of current treatment plan related to  self management and patient's adherence to plan as established by provider 03/13/21: Initial Assessment completed  04/10/21 Acute/ Unscheduled outreach placed to patient who reported yesterday to CCM CSW that she has not yet received any follow up on her request from 03/13/21 for DME nor received the medication for Nystop medication powder for breast rash Care Coordination outreach to [facility] [phone] spoke with [facility]): [facility] reports "we never received any order for a large rolling walker;" rep confirms that order request must be faxed to [phone]: secure message sent to Dr. Ronnald Ramp and Jari Pigg, who had previously worked on this: requested that second order be faxed asap to [facility]  [facility] Care Coordination outreach to [place]: they confirm  that on 03/25/21 patient picked up both previously ordered prescriptions for ketoconazole cream and Nystatin ORAL suspension Patient reports she already had ketoconazole cream, and "it doesn't work, it stays too wet" Patient reports she had no idea why Nystatin oral suspension was ordered: states she wants the [place]-- and if Dr. Ronnald Ramp does  not wish to order, she would like to know why Patient provided update on efforts I will take today to again have her requests from 03/13/21 addressed; I also encouraged patient to follow up with office team as needed if she has ongoing questions-- she reports she did contact office team on 03/27/21 and has not heard anything back until my call today  From initial outreach 03/13/21:  Pain assessment updated; patient declines referral to pain management specialist; reports does not like taking medications for pain; reports overall very poor pain management at home; states the only thing that she does to help mitigate pain "is to lay around all day" Falls assessment completed: denies falls x 12 months, but reports several "near falls" as a result of chronic pain-- she is requesting a rollator walker-- 04/10/21: will place second message PCP and office team to ensure that DME order has been appropriately placed  03/13/21: SDOH completed; [place] referral placed as indicated, as above 03/13/21: Depression screening updated; patient confirms today that she is NOT suicidal; however, she states frequently throughout our conversation that she "feels immobilized by her depression;" states that she "can't do anything but lay around all day;" "have no motivation to do anything at all;"  Reports she was contacted by [facility] psychiatric provider as per PCP referral 03/01/21-- reports "they will not take my insurance;" she remains interested in obtaining a new psychiatric provider  Confirmed she is NOT currently taking any psychiatric medications; states "it didn't seem to be working, so why take medicine if it is not working" Provided resource for [employer]: she confirms that she has the brochure for this facility and will use if needed/ necessary; re-confirmed address and phone number with patient [phone]) Discussed that I would place CCM CSW referral for  management of mental health needs: encouraged patient to listen out for call from scheduler, who will schedule a phone call with the CSW  Heart Failure Interventions:  (Status: New goal.)  Long Term Goal  Wt Readings from Last 3 Encounters:  03/01/21 (!) 319 lb (144.7 kg)  02/12/21 (!) 323 lb (146.5 kg)  12/29/20 (!) 333 lb 6.4 oz (151.2 kg)  Basic overview and discussion of pathophysiology of Heart Failure reviewed Provided education on low sodium diet Assessed need for readable accurate scales in home Discussed importance of daily weight and advised patient to weigh and record daily Reviewed role of diuretics in prevention of fluid overload and management of heart failure Discussed the importance of keeping all appointments with provider Confirmed patient does not monitor and/ or record daily weights/ blood pressures, etc at home, she states, "I'm so depressed, I just really don't care that much about my health" Confirmed patient's husband manages her medications for her: he fills weekly pill box for patient and she requires intermittent reminding to take as prescribed Reviewed recent PCP office visit 03/01/21 with patient- she confirms no medication changes; states that she requested a prescription for "Nystop medicated powder" for rash at her breast line; at time of office visit: has not heard back: will  re-request from PCP; second request for this medication placed 04/10/21  Diabetes:  (Status: New goal.) Long Term Goal   Lab Results  Component Value Date   HGBA1C 6.9 (H) 03/01/2021  Provided education to patient about basic DM disease process; Reviewed medications with patient and discussed importance of medication adherence;        Reviewed prescribed diet with patient low carb/ sugar, heart healthy, low salt/ low cholesterol: reports overall adherence to diet, has trouble affording fresh food in light of national economy/ high grocery prices-- CCM Community resource care guide referral  placed; Discussed plans with patient for ongoing care management follow up and provided patient with direct contact information for care management team;      Reviewed scheduled/upcoming provider appointments including: 04/02/21 cardiology provider; 08/08/21- [facility] ;         Referral made to social work team for assistance with debilitating depression with request for new psychiatric provider;      Referral made to community resources care guide team for assistance with fresh food pantries/ affordable dental services;      Review of patient status, including review of consultants reports, relevant laboratory and other test results, and medications completed;       Confirmed patient does not monitor/ record blood sugars at home; states, "I am just not motivated to do anything I should be doing;"   Patient Goals/Self-Care Activities: As evidenced by review of EHR, collaboration with care team, and patient reporting during CCM RN CM outreach,  Patient Nalani will: Take medications as prescribed Attend all scheduled provider appointments Call pharmacy for medication refills Call provider office for new concerns or questions Continue your efforts to follow heart healthy, low salt, low cholesterol, carbohydrate-modified, low sugar diet Please listen for a call from the [employer] care Guide team: I have asked them to call you to provide resources for food pantries that provide FRESH vegetables/ food, and to provide you with resources for affordable dental services I have placed a referral for the Clinical Social Worker to talk to you about your management of depression and your need for a new psychiatric provider- please listen for a call from the scheduler to contact you to make a telephone appointment with the social worker I have made Dr. Ronnald Ramp aware of your request for a rollator walker and for the medicated powder you asked about-- please listen for follow up on these requests from Dr. Ronnald Ramp'  office staff Review enclosed educational material                    Plan: Telephone follow up appointment with care management team member scheduled for:  Tuesday April 17, 2021 at 3:00 pm The patient has been provided with contact information for the care management team and has been advised to call with any health related questions or concerns  Oneta Rack, RN, BSN, [place] [phone]: direct office

## 2021-04-10 NOTE — Telephone Encounter (Signed)
° °  Telephone encounter was:  Unsuccessful.  04/10/2021 Name: [name] MRN: [id number] DOB: 1966-01-01  Mailing Resources Patient reqested   [facility]  [phone] [address], [place], [place] [address] Phone: [phone] Email: [email or web address]

## 2021-04-10 NOTE — Telephone Encounter (Signed)
-----   Message from Hoyt Koch, MD sent at 04/10/2021  2:58 PM EST ----- Have sent in nystatin powder to [facility] on file ----- Message ----- From: Jari Pigg, [place]: 04/10/2021   2:39 PM EST To: Knox Royalty, RN, Hoyt Koch, MD  Hi-  Dr. Ronnald Ramp is out of the office this week. I will see if another provider can address her medication concerns. However, the order for the DME was entered on 1/18. I will follow up with [facility] per [employer].   Dr. Sharlet Salina can you assist with the pt's med concern in PCPs absence. Thank you.  Caoimhe Damron, CMA  ----- Message ----- From: Knox Royalty, RN Sent: 04/10/2021   2:30 PM EST To: [name], MD, Jari Pigg, CMA  Hi Dr. Ronnald Ramp, [place]-- Patient is very frustrated that she has not heard back re: her 03/13/21 requests for large rolling walker DME and her request for [place] [facility] today [phone] [place]): [facility] and was told "no order placed for a large rolling walker;" rep said order must be faxed to [phone]: please fax accordingly Patient reports she already had ketoconazole cream, (Rx 03/14/21) and "it doesn't work, it stays too wet," and that she had no idea why Nystatin oral suspension was ordered (Rx 03/21/21): she wants the [place]-- if Dr. Ronnald Ramp does not wish to order, she would like to know why; reports she contacted office team herself on 03/27/21 and has not heard back until my call today Patient has been provided update on efforts taken today and reports she will follow up next week herself if she continues to have lack of follow up; again, patient is very frustrated given she requested all 4 weeks ago [place] Cera Tousey,CCM RN CM [phone]

## 2021-04-12 NOTE — Telephone Encounter (Signed)
I received a response from [facility] team member as copy and pasted below:  Sharyl Nimrod, [place], [place]; Eugenia Pancoast; Miquel Dunn Thank you for letting me know! I'll get it fixed!    Pt has been informed that [facility] is working on getting it corrected.

## 2021-04-12 NOTE — Telephone Encounter (Signed)
Pt has called the office and stated that she received a "regular walker" and not what she requested. She expressed frustration that is has taken a month to receive and that she is feeling overwhelmed with this process. She mentioned that she has mobility issues that causes he not to be able to stand or walk for long periods without stopping to rest stressing the need for a rollator with seat. I apologized to her for the inconvenience and stated that I would follow up with the [facility] team via "high priority" [employer] as copy and pasted below:  "Afternoon-  I received a call from the pt stating that a box was delivered to her home that contained a regular walker. It was not what was ordered or what she requested. The order in Epic was for a "4 wheeled rolling walker WITH seat". I also added in the comment section that the pt requested a "plus size and/or wide rollator". Please correct and reach out to the pt as she is getting very frustrated that this has taken a long time to receive and then to receive something that she did not want is starting to overwhelm her.   Please reach out to the pt to discuss pick up and delivery of the correct DME [phone].  Thank you  Kiaan Overholser, CMA"   I informed the pt that I would follow up with her as soon as I have an update and stated that she may be contacted directly in regard. She expressed understanding.

## 2021-04-14 ENCOUNTER — Other Ambulatory Visit: Payer: Self-pay | Admitting: Internal Medicine

## 2021-04-14 DIAGNOSIS — B354 Tinea corporis: Secondary | ICD-10-CM

## 2021-04-16 ENCOUNTER — Telehealth (HOSPITAL_COMMUNITY): Payer: Medicare Other | Admitting: Psychiatry

## 2021-04-17 ENCOUNTER — Ambulatory Visit: Payer: Medicare Other | Admitting: *Deleted

## 2021-04-17 ENCOUNTER — Other Ambulatory Visit (HOSPITAL_COMMUNITY): Payer: Self-pay | Admitting: *Deleted

## 2021-04-17 ENCOUNTER — Telehealth (HOSPITAL_COMMUNITY): Payer: Self-pay | Admitting: *Deleted

## 2021-04-17 DIAGNOSIS — F3341 Major depressive disorder, recurrent, in partial remission: Secondary | ICD-10-CM

## 2021-04-17 DIAGNOSIS — E119 Type 2 diabetes mellitus without complications: Secondary | ICD-10-CM

## 2021-04-17 DIAGNOSIS — I5022 Chronic systolic (congestive) heart failure: Secondary | ICD-10-CM

## 2021-04-17 MED ORDER — VORTIOXETINE HBR 5 MG PO TABS: 5.0000 mg | ORAL_TABLET | Freq: Every day | ORAL | 0 refills | Status: DC

## 2021-04-17 MED ORDER — VORTIOXETINE HBR 10 MG PO TABS: 10.0000 mg | ORAL_TABLET | Freq: Every day | ORAL | 0 refills | Status: DC

## 2021-04-17 NOTE — Chronic Care Management (AMB) (Signed)
Chronic Care Management   CCM RN Visit Note  04/17/2021 Name: [name] MRN: [id number] DOB: 05-01-1965  Subjective: [name] is a 56 y.o. year old female who is a primary care patient of [name], MD. The care management team was consulted for assistance with disease management and care coordination needs.    Engaged with patient by telephone for follow up visit in response to provider referral for case management and/or care coordination services.   Consent to Services:  The patient was given information about Chronic Care Management services, agreed to services, and gave verbal consent prior to initiation of services.  Please see initial visit note for detailed documentation.  Patient agreed to services and verbal consent obtained.   Assessment: Review of patient past medical history, allergies, medications, health status, including review of consultants reports, laboratory and other test data, was performed as part of comprehensive evaluation and provision of chronic care management services.  CCM Care Plan  Allergies  Allergen Reactions   Fluoxetine Other (See Comments)    More depressed   Cymbalta [Duloxetine Hcl] Other (See Comments)    depressed   Penicillins     REACTION: rash Has patient had a PCN reaction causing immediate rash, facial/tongue/throat swelling, SOB or lightheadedness with hypotension: YES Has patient had a PCN reaction causing severe rash involving mucus membranes or skin necrosis: NO Has patient had a PCN reaction that required hospitalization: YES Has patient had a PCN reaction occurring within the last 10 years: NO If all of the above answers are "NO", then may proceed with Cephalosporin use.    Outpatient Encounter Medications as of 04/17/2021  Medication Sig   carvedilol (COREG) 12.5 MG tablet Take 1 tablet (12.5 mg total) by mouth 2 (two) times daily with a meal.   Dapagliflozin-metFORMIN HCl ER (XIGDUO XR) 11-998 MG TB24 Take 1 tablet  by mouth daily.   furosemide (LASIX) 80 MG tablet Take 1 tablet (80 mg total) by mouth daily.   LINZESS 145 MCG CAPS capsule TAKE 1 CAPSULE BY MOUTH DAILY BEFORE BREAKFAST.   nystatin (MYCOSTATIN/NYSTOP) powder Apply 1 application topically 3 (three) times daily.   potassium chloride SA (KLOR-CON M20) 20 MEQ tablet Take 2 tablets (40 mEq total) by mouth daily.   sacubitril-valsartan (ENTRESTO) 97-103 MG Take 1 tablet by mouth 2 (two) times daily.   simvastatin (ZOCOR) 20 MG tablet Take 1 tablet (20 mg total) by mouth daily.   No facility-administered encounter medications on file as of 04/17/2021.   Patient Active Problem List   Diagnosis Date Noted   Tinea corporis 03/14/2021   Hyperlipidemia LDL goal <100 03/01/2021   DDD (degenerative disc disease), lumbar 08/08/2020   Non-seasonal allergic rhinitis due to pollen 07/25/2020   Chronic idiopathic constipation 07/25/2020   Visit for screening mammogram 02/11/2020   Chronic bilateral low back pain without sciatica 02/10/2020   Spondylosis without myelopathy or radiculopathy, lumbar region 12/04/2017   Type 2 diabetes mellitus with hyperglycemia (HCC)    Normal coronary arteries 04/17/2016   OSA on CPAP 10/28/2014   Major depressive disorder 26/71/2458   Chronic systolic CHF (congestive heart failure), NYHA class 3 ([place]) 12/06/2013   Hyperlipidemia associated with type 2 diabetes mellitus ([place]), on Zocor 04/04/2011   Abnormal electrocardiography 01/16/2009   Hypertension associated with diabetes ([place]) 06/03/2008   Morbid obesity with BMI of 50.0-59.9, adult ([place]) 07/12/2006   NICM (nonischemic cardiomyopathy) ([place]) 07/12/2006   Attention deficit disorder 07/12/2006   Migraine without status migrainosus, not  intractable 07/12/2006   Conditions to be addressed/monitored:  CHF and DMII  Care Plan : RN Care Manager Plan of Care  Updates made by Knox Royalty, RN since 04/17/2021 12:00 AM     Problem: Chronic Disease Management Needs    Priority: High     Long-Range Goal: Ongoing adherence to established Plan of Care for long term chronic disease management   Start Date: 03/13/2021  Expected End Date: 03/13/2022  Priority: High  Note:   Current Barriers:  Chronic Disease Management support and education needs related to CHF, DMII, and severe depression Chronic back pain: reports "9/10" chronic back on daily basis: states ongoing pain prevents her from going upstairs to sleep in bedroom, she is currently sleeping downstairs; reports chronic pain makes her unable to do iADL's and her husband is essentially her caregiver for both ADL's and iADL's; she declines referral for pain management provider; states she rarely takes medications for pain; patient also reports that in the past PCP has referred her to outpatient rehabilitation/ PT, however, she never followed up because she is "so immobilized by depression and pain" that she could "never bring herself" to call and schedule with rehabilitation team-- she is currently not interested in another outpatient rehabilitation referral Ongoing depression- reports long-standing history depression in her family and herself; reports she had psychiatric provider that she "liked" very much, however, she did not feel that she was making progress in therapy with previous psychiatric provider: states she did not believe the medications provider prescribed was helping, and "all he wanted to do was to increase the doses;" today, 03/13/21- she confirms that she is not under psychiatric care and that she is NOT taking prescribed medications for depression 04/17/21: Patient confirms she had initial visit with psychiatric provider; today she tells me that her follow up in-person visit with psychiatric provider was cancelled by the provider, and that the office team is unable to re-schedule a timely appointment; she is very frustrated by this and feels she needs to be seen in person by provider as soon as  possible so that she can be placed on the "right" antidepressant -- she agrees today to continue her efforts to follow up to ensure that this appointment is re- scheduled Self- acknowledges lack of motivation in adhering to plan of care around state of health: she attributes this to both her ongoing severe depression and her chronic back pain Need for resources for affordable dental services and for food acquisition for FRESH food only: has tried other food pantries, but "they are all unhealthy food choices/ canned" food, which patient is not interested in: [facility] referral placed 04/17/21: confirmed [employer] reached out to patient several times, without success: provided patient with number for care guide should she wish to engage with them in the future to obtain resources Need for DME: reports she would like a rollator walker, "for a large person:" states she asked PCP about this at time of PCP office visit and has not yet heard anything; I will reach out to PCP to re-request on patient's behalf 04/10/21: second request for requested DME placed with PCP office team 04/17/21: confirmed patient has finally received the appropriate rolling walker that she requested  RNCM Clinical Goal(s):  Patient will demonstrate improved health management independence as evidenced by adherence to plan of care for depression, CHF, and DMII          through collaboration with RN Care manager, provider, and care  team.   Interventions: 1:1 collaboration with primary care provider regarding development and update of comprehensive plan of care as evidenced by provider attestation and co-signature Inter-disciplinary care team collaboration (see longitudinal plan of care) Evaluation of current treatment plan related to  self management and patient's adherence to plan as established by provider 03/13/21: Initial Assessment completed 04/17/21: Confirmed patient obtained and is using NYSTOP  powder, as per previous request Reviewed upcoming provider office visits: 04/18/21- epidural for back pain; 04/30/21- CCM CSW; 06/11/21- gynecology/ PAP; 06/25/21- [facility]/ cards follow up; confirmed patient aware of all and has plans to attend as scheduled  Heart Failure Interventions:  (Status: 04/17/21: Goal on Track (progressing): YES.)  Long Term Goal  Wt Readings from Last 3 Encounters:  04/02/21 (!) 326 lb 3.2 oz (148 kg)  03/01/21 (!) 319 lb (144.7 kg)  02/12/21 (!) 323 lb (146.5 kg)  Basic overview and discussion of pathophysiology of Heart Failure reviewed Provided education on low sodium diet Assessed need for readable accurate scales in home Discussed importance of daily weight and advised patient to weigh and record daily Reviewed role of diuretics in prevention of fluid overload and management of heart failure Discussed the importance of keeping all appointments with provider Confirmed patient still not monitoring and/ or recording daily weights/ blood pressures, etc at home, continues to report "too depressed to do anything I am supposed to;" she was again encouraged to start monitoring/ recording, even if it is only several times per week Reviewed recent cardiology provider office visit 04/02/21: cardiology provider noted several concerns around patient's medication use at home; whether or not patient was taking medications as prescribed Re- confirmed that patient's husband manages her medications for her: he fills weekly pill box for patient and she requires intermittent reminding to take as prescribed Today, she tells me she "thinks" she is taking everything the way she "should be;" states that she has tried to be more adherent to medication regimen, although she is unable to tell me which medications she is and is not taking; she agrees today to have her husband make a list of her medications for her to have on hand, and she agrees to my placing a CCM Pharmacy referral for medication  adherence/ optimization Continues to state she would like to be placed on antidepressant, but she is very particular about "which" antidepressant, as she has tried several before and then "just stopped" taking them due to her not seeing any changes in her overall outlook/ depressive symptoms-- we discussed need for her to continue her efforts to re-schedule psychiatric provider office visit to have this request addressed- she verbalizes understanding; states she will continue her efforts to have in-person visit re-scheduled Patient confirms today that she "knows" she is taking the diuretic and taking her medication for DMII She mentions that she is taking "buspar" but I do not see this on her medication list, and she is unable to provide me with specific details of this medication, due to her husband handling medications and not being at home at this moment: again encouraged her to have her husband make a list of her CURRENT medications for her review with CCM pharmacy team CCM Pharmacy team referral placed today  Diabetes:  (Status: 04/17/21: Goal on Track (progressing): YES.) Long Term Goal   Lab Results  Component Value Date   HGBA1C 6.9 (H) 03/01/2021  Provided education to patient about basic DM disease process; Reviewed prescribed diet with patient low carb/ sugar, heart healthy, low salt/  low cholesterol: reports overall adherence to diet "the best that I can"; Discussed plans with patient for ongoing care management follow up and provided patient with direct contact information for care management team;      Review of patient status, including review of consultants reports, relevant laboratory and other test results, and medications completed;       Confirmed patient does not monitor/ record blood sugars at home; previously stated, "I am just not motivated to do anything I should be doing;" however, today, she tells me that she "has never been told to" monitor blood sugars at home, not does she  have a glucometer; today, she tells me "Dr. Ronnald Ramp said my lab values are okay;" and "to just take the medications he prescribed" Reports "Dr. Ronnald Ramp told me on 03/01/21 that he was going to order me a new Diabetes medicine that will help me with weight loss;" however, upon review, I do not see any new medications from 03/01/21 office visit-- encouraged her to discuss with CCM pharmacist once outreach is established Assessed patient's understanding of meaning/ significance of A1-C values: fair baseline understanding of same- Reviewed individual historical A1-C trends and provided education around correlation of A1-C value to blood sugar levels at home over 3 months  Patient Goals/Self-Care Activities: As evidenced by review of EHR, collaboration with care team, and patient reporting during CCM RN CM outreach,  Patient Evadne will: Take medications as prescribed Attend all scheduled provider appointments Call pharmacy for medication refills Call provider office for new concerns or questions Continue your efforts to follow heart healthy, low salt, low cholesterol, carbohydrate-modified, low sugar diet [facility] [employer] care Guide team has been trying to contact you to provide resources you have requested for food pantries that provide FRESH vegetables/ food, and to provide you with resources for affordable dental services-- if you are still interested in these resources, please call them at [phone] I am glad you have received the rolling walker you requested-- please use it to prevent falls I have asked the pharmacist at Dr. Ronnald Ramp to contact you about the medications you are taking, to make sure your medications are optimized-- please listen out for a call from the pharmacist and have your medications ready for review; if you have questions/ concerns about your medications, this is your opportunity to have them answered Please continue your efforts to re-schedule your cancelled appointment with  your psychiatric provider Please stay in touch with the Clinical Social Worker for assistance with management of your depression         Plan: Telephone follow up appointment with care management team member scheduled for:  Wednesday May 16, 2021 at 3:00 pm The patient has been provided with contact information for the care management team and has been advised to call with any health related questions or concerns  Oneta Rack, RN, BSN, [place] [phone]: direct office

## 2021-04-17 NOTE — Telephone Encounter (Signed)
Writer spoke with pt to advise that [employer] samples and a copy of [facility] testing results are ready for her to pick up. This nurse reiterated med ed started with Dr. Adele Schilder. Pt verbalizes understanding. Husband is pick up samples and test results tomorrow.

## 2021-04-17 NOTE — Patient Instructions (Addendum)
Visit Information  Eugenie, thank you for taking time to talk with me today. Please don't hesitate to contact me if I can be of assistance to you before our next scheduled telephone appointment.  Below are the goals we discussed today:  Patient Self-Care Activities: Patient [name] will: Take medications as prescribed Attend all scheduled provider appointments Call pharmacy for medication refills Call provider office for new concerns or questions Continue your efforts to follow heart healthy, low salt, low cholesterol, carbohydrate-modified, low sugar diet [facility] [employer] care Guide team has been trying to contact you to provide resources you have requested for food pantries that provide FRESH vegetables/ food, and to provide you with resources for affordable dental services-- if you are still interested in these resources, please call them at [phone] I am glad you have received the rolling walker you requested-- please use it to prevent falls I have asked the pharmacist at Dr. Ronnald Ramp to contact you about the medications you are taking, to make sure your medications are optimized-- please listen out for a call from the pharmacist and have your medications ready for review; if you have questions/ concerns about your medications, this is your opportunity to have them answered Please continue your efforts to re-schedule your cancelled appointment with your psychiatric provider Please stay in touch with the Clinical Social Worker for assistance with management of your depression       Our next scheduled telephone follow up visit/ appointment with care management team member is scheduled on:   Wednesday, May 16, 2021 at 3:00 pm- This is a PHONE CALL appointment  If you need to cancel or re-schedule our visit, please call [phone] and our care guide team will be happy to assist you.   I look forward to hearing about your progress.   Oneta Rack, RN, BSN, [place] [phone]: direct office  If you are experiencing a Mental Health or [place] or need someone to talk to, please  call the Suicide and Crisis Lifeline: 988 call the [place] National Suicide Prevention Lifeline: (770)061-6273 or TTY: (681)038-9084 TTY 586 381 7948) to talk to a trained counselor call 1-800-273-TALK (toll free, 24 hour hotline) go to [facility] [address], [place] [phone]) call 911   The patient verbalized understanding of instructions, educational materials, and care plan provided today and agreed to receive a mailed copy of patient instructions, educational materials, and care plan  Fall Prevention in the Home, Adult Falls can cause injuries and can happen to people of all ages. There are many things you can do to make your home safe and to help prevent falls. Ask for help when making these changes. What actions can I take to prevent falls? General Instructions Use good lighting in all rooms. Replace any light bulbs that burn out. Turn on the lights in dark areas. Use night-lights. Keep items that you use often in easy-to-reach places. Lower the shelves around your home if needed. Set up your furniture so you have a clear path. Avoid moving your furniture around. Do not have throw rugs or other things on the floor that can make you trip. Avoid walking on wet floors. If any of your floors are uneven, fix them. Add color or contrast paint or tape to clearly mark and help you see: Grab bars or handrails. First and last steps of staircases. Where the edge of each step is. If you use a  stepladder: Make sure that it is fully opened. Do not climb a closed stepladder. Make sure the sides of the stepladder are locked in place. Ask someone to hold the stepladder while you use it. Know where your pets are when moving through your home. What can I do in the  bathroom?   Keep the floor dry. Clean up any water on the floor right away. Remove soap buildup in the tub or shower. Use nonskid mats or decals on the floor of the tub or shower. Attach bath mats securely with double-sided, nonslip rug tape. If you need to sit down in the shower, use a plastic, nonslip stool. Install grab bars by the toilet and in the tub and shower. Do not use towel bars as grab bars. What can I do in the bedroom? Make sure that you have a light by your bed that is easy to reach. Do not use any sheets or blankets for your bed that hang to the floor. Have a firm chair with side arms that you can use for support when you get dressed. What can I do in the kitchen? Clean up any spills right away. If you need to reach something above you, use a step stool with a grab bar. Keep electrical cords out of the way. Do not use floor polish or wax that makes floors slippery. What can I do with my stairs? Do not leave any items on the stairs. Make sure that you have a light switch at the top and the bottom of the stairs. Make sure that there are handrails on both sides of the stairs. Fix handrails that are broken or loose. Install nonslip stair treads on all your stairs. Avoid having throw rugs at the top or bottom of the stairs. Choose a carpet that does not hide the edge of the steps on the stairs. Check carpeting to make sure that it is firmly attached to the stairs. Fix carpet that is loose or worn. What can I do on the outside of my home? Use bright outdoor lighting. Fix the edges of walkways and driveways and fix any cracks. Remove anything that might make you trip as you walk through a door, such as a raised step or threshold. Trim any bushes or trees on paths to your home. Check to see if handrails are loose or broken and that both sides of all steps have handrails. Install guardrails along the edges of any raised decks and porches. Clear paths of anything that can make  you trip, such as tools or rocks. Have leaves, snow, or ice cleared regularly. Use sand or salt on paths during winter. Clean up any spills in your garage right away. This includes grease or oil spills. What other actions can I take? Wear shoes that: Have a low heel. Do not wear high heels. Have rubber bottoms. Feel good on your feet and fit well. Are closed at the toe. Do not wear open-toe sandals. Use tools that help you move around if needed. These include: Canes. Walkers. Scooters. Crutches. Review your medicines with your doctor. Some medicines can make you feel dizzy. This can increase your chance of falling. Ask your doctor what else you can do to help prevent falls. Where to find more information Centers for Disease Control and Prevention, [employer]: http://www.wolf.info/ National Institute on Aging: http://kim-miller.com/ Contact a doctor if: You are afraid of falling at home. You feel weak, drowsy, or dizzy at home. You fall at home. Summary There are many simple  things that you can do to make your home safe and to help prevent falls. Ways to make your home safe include removing things that can make you trip and installing grab bars in the bathroom. Ask for help when making these changes in your home. This information is not intended to replace advice given to you by your health care provider. Make sure you discuss any questions you have with your health care provider. Document Revised: 09/15/2019 Document Reviewed: 09/15/2019 Elsevier Patient Education  [place].

## 2021-04-18 ENCOUNTER — Telehealth: Payer: Self-pay | Admitting: Internal Medicine

## 2021-04-18 ENCOUNTER — Other Ambulatory Visit: Payer: Self-pay

## 2021-04-18 ENCOUNTER — Ambulatory Visit
Admission: RE | Admit: 2021-04-18 | Discharge: 2021-04-18 | Disposition: A | Discharge status: Home / Self Care | Payer: Medicare Other | Source: Ambulatory Visit | Attending: Family Medicine | Admitting: Family Medicine

## 2021-04-18 DIAGNOSIS — M5116 Intervertebral disc disorders with radiculopathy, lumbar region: Secondary | ICD-10-CM | POA: Diagnosis not present

## 2021-04-18 DIAGNOSIS — M5416 Radiculopathy, lumbar region: Secondary | ICD-10-CM

## 2021-04-18 MED ORDER — DIAZEPAM 5 MG PO TABS
5.0000 mg | ORAL_TABLET | Freq: Once | ORAL | Status: AC
  Administered 2021-04-18: 5 mg via ORAL

## 2021-04-18 MED ORDER — IOPAMIDOL (ISOVUE-M 200) INJECTION 41%
1.0000 mL | Freq: Once | INTRAMUSCULAR | Status: AC
  Administered 2021-04-18: 1 mL via EPIDURAL

## 2021-04-18 MED ORDER — METHYLPREDNISOLONE ACETATE 40 MG/ML INJ SUSP (RADIOLOG
80.0000 mg | Freq: Once | INTRAMUSCULAR | Status: AC
  Administered 2021-04-18: 80 mg via EPIDURAL

## 2021-04-18 NOTE — Discharge Instructions (Signed)

## 2021-04-18 NOTE — Chronic Care Management (AMB) (Signed)
°  Chronic Care Management   Note  04/18/2021 Name: [name] MRN: [id number] DOB: 06/10/1965  [name] is a 56 y.o. year old female who is a primary care patient of [name], MD. I reached out to [name] by phone today in response to a referral sent by [name]'s PCP, [name], MD.   [name] was given information about Chronic Care Management services today including:  CCM service includes personalized support from designated clinical staff supervised by her physician, including individualized plan of care and coordination with other care providers 24/7 contact phone numbers for assistance for urgent and routine care needs. Service will only be billed when office clinical staff spend 20 minutes or more in a month to coordinate care. Only one practitioner may furnish and bill the service in a calendar month. The patient may stop CCM services at any time (effective at the end of the month) by phone call to the office staff.   Patient wishes to consider information provided and/or speak with a member of the care team before deciding about enrollment in care management services.   Follow up plan: PT WAS CONCERNED ABOUT COST SINCE HER INSURANCE ONLY COVERS 80%   Psychologist, prison and probation services

## 2021-04-24 DIAGNOSIS — F3341 Major depressive disorder, recurrent, in partial remission: Secondary | ICD-10-CM

## 2021-04-24 DIAGNOSIS — E119 Type 2 diabetes mellitus without complications: Secondary | ICD-10-CM | POA: Diagnosis not present

## 2021-04-24 DIAGNOSIS — I5022 Chronic systolic (congestive) heart failure: Secondary | ICD-10-CM | POA: Diagnosis not present

## 2021-04-25 ENCOUNTER — Telehealth: Payer: Self-pay

## 2021-04-25 NOTE — Telephone Encounter (Signed)
Please disreguard ?

## 2021-04-26 ENCOUNTER — Telehealth: Payer: Self-pay

## 2021-04-26 NOTE — Telephone Encounter (Signed)
? ?  Telephone encounter was:  Successful.  ?04/26/2021 ?Name: [name] MRN: [id number] DOB: 05-18-1965 ? ?[name] is a 56 y.o. year old female who is a primary care patient of [name], MD . The community resource team was consulted for assistance with Food Insecurity and Financial Difficulties related to financial strain ? ?Care guide performed the following interventions: Patient provided with information about care guide support team and interviewed to confirm resource needs.patient never received resources in the mail I will remail the resources ? ?Follow Up Plan:  Care guide will follow up with patient by phone over the next two weeks ? ? ? ?Larena Sox ?Care Guide, Embedded Care Coordination ?, Care Management  ?[phone] ?[address], [place], [place] [address] ?Phone: [phone] ?Email: [email or web address] ? ?  ?

## 2021-04-30 ENCOUNTER — Ambulatory Visit (INDEPENDENT_AMBULATORY_CARE_PROVIDER_SITE_OTHER): Payer: Medicare Other | Admitting: Licensed Clinical Social Worker

## 2021-04-30 ENCOUNTER — Telehealth (HOSPITAL_COMMUNITY): Payer: Medicare Other | Admitting: Psychiatry

## 2021-04-30 DIAGNOSIS — F3341 Major depressive disorder, recurrent, in partial remission: Secondary | ICD-10-CM

## 2021-04-30 DIAGNOSIS — F902 Attention-deficit hyperactivity disorder, combined type: Secondary | ICD-10-CM

## 2021-04-30 NOTE — Chronic Care Management (AMB) (Signed)
?Chronic Care Management  ? Clinical Social Work Note ? ?04/30/2021 ?Name: [name] MRN: [id number] DOB: May 29, 1965 ? ?[name] is a 56 y.o. year old female who is a primary care patient of Samantha Lima, MD. The CCM team was consulted to assist the patient with chronic disease management and/or care coordination needs related to:  mental health treatment and medication management .  ? ?Engaged with patient by telephone for follow up visit in response to provider referral for social work chronic care management and care coordination services.  ? ?Consent to Services:  ?The patient was given information about Chronic Care Management services, agreed to services, and gave verbal consent prior to initiation of services.  Please see initial visit note for detailed documentation.  ?Patient agreed to services and consent obtained.  ? ?Summary: Assessed patient's current treatment, progress, coping skills, support system and barriers to care.  She is making progress with connecting to mental health provider , but continues to have difficulty with managing levels of frustration as well as symptoms of anxiety and depression .She received [name] calendar to help with keeping up with appointments.  Has also received walker and Nystop.   See Care Plan below for interventions and patient self-care actives. ? ?Recommendation: Patient may benefit from, and is in agreement to keep all mental health appointments.  ? ?Follow up Plan: Patient would like continued follow-up from CCM LCSW.  per patient's request will follow up in 30 days.  Will call office if needed prior to next encounter. ?  ?Assessment: Review of patient past medical history, allergies, medications, and health status, including review of relevant consultants reports was performed today as part of a comprehensive evaluation and provision of chronic care management and care coordination services.    ? ?SDOH (Social Determinants of Health) assessments and  interventions performed:   ? ?Advanced Directives Status: Not addressed in this encounter. ? ?CCM Care Plan ? ?Conditions to be addressed/monitored: Depression; Mental Health Concerns  ? ?Care Plan : LCSW Plan of Care  ?Updates made by Maurine Cane, LCSW since 04/30/2021 12:00 AM  ?  ? ?Problem: Symptoms of Depression not managed   ?  ? ?Goal: Symptoms Monitored and Managed with medication and therapy   ?Start Date: 03/20/2021  ?This Visit's Progress: On track  ?Recent Progress: On track  ?Priority: High  ?Note:   ?Current Barriers:  ?Disease Management support and education needs related to Depression: depressed mood ?loss of energy/fatigue ?difficulty concentrating ? ?CSW Clinical Goal(s):  ?Patient  will work with mental health provider and LCSW to address needs related to symptoms of depression  through collaboration with Clinical Social Worker, provider, and care team.  ? ?Interventions: ?1:1 collaboration with primary care provider regarding development and update of comprehensive plan of care as evidenced by provider attestation and co-signature ?Inter-disciplinary care team collaboration (see longitudinal plan of care) ?Evaluation of current treatment plan related to  self management and patient's adherence to plan as established by provider ? ?Mental Health:  (Status: Goal on Track (progressing): YES.) ?Evaluation of current treatment plan related to ADHD, Mood Instability, and symptoms of depression  ?Solution-Focused Strategies employed:  ?Active listening / Reflection utilized  ?Emotional Support Provided ?Problem Solving /Task Center strategies reviewed ?Provided psychoeducation for mental health needs  ?Crisis Resource Education / information provided  ?Collaborated with [place] to remove barriers to care for patient with navigating mental health needs ?Expressed other health related concerns will share with CCM RN ?( Has  rolling walker; has Nystop powder for breast; dentist ; ? Needs  to make appointments for breast center; and dermatology appointment) ? ?Patient Self-Care Activities: ?Keep appointment with psychiatry on March 9th, 3:40 ?Utilize the information below if you have a mental health crisis ?[place], [place] ?[place] [phone]  Crisis [phone] ? ?  ? ?Samantha Lanius, LCSW ?Licensed Clinical Social Worker [name]  ?[place]  ?[phone]  ? ? ? ?

## 2021-04-30 NOTE — Patient Instructions (Signed)
Visit Information ? ?Thank you for taking time to visit with me today. Please don't hesitate to contact me if I can be of assistance to you before our next scheduled telephone appointment. ? ?Following are the goals we discussed today: Mental Health Treatment ?Patient Self-Care Activities: ?Keep appointment with psychiatry on March 9th, 3:40 ?Utilize the information below if you have a mental health crisis ?[place], [place] ?[place] [phone]  Crisis [phone] ? ?Our next appointment is by telephone on April 4th  at 1:30 ? ?Please call the care guide team at [phone] if you need to cancel or reschedule your appointment.  ? ?If you are experiencing a Mental Health or [place] or need someone to talk to, please call the Suicide and Crisis Lifeline: 988 ?call the [place] National Suicide Prevention Lifeline: 825-089-3742 or TTY: 5644955767 TTY (774)753-2248) to talk to a trained counselor ?call 1-800-273-TALK (toll free, 24 hour hotline) ?go to [facility] [address], [place] [phone]) ?call 911  ? ?The patient verbalized understanding of instructions, educational materials, and care plan provided today and agreed to receive a mailed copy of patient instructions, educational materials, and care plan.  ? ?[name], LCSW ?Licensed Clinical Social Worker [facility]  ?[place]  ?[phone]   ?

## 2021-05-01 ENCOUNTER — Telehealth (HOSPITAL_COMMUNITY): Payer: Medicare Other | Admitting: Psychiatry

## 2021-05-03 ENCOUNTER — Other Ambulatory Visit: Payer: Self-pay

## 2021-05-03 ENCOUNTER — Ambulatory Visit (HOSPITAL_BASED_OUTPATIENT_CLINIC_OR_DEPARTMENT_OTHER): Payer: Medicare Other | Admitting: Psychiatry

## 2021-05-03 ENCOUNTER — Encounter (HOSPITAL_COMMUNITY): Payer: Self-pay | Admitting: Psychiatry

## 2021-05-03 VITALS — BP 151/79 | HR 87 | Temp 98.5°F | Ht 64.0 in | Wt 321.2 lb

## 2021-05-03 DIAGNOSIS — F332 Major depressive disorder, recurrent severe without psychotic features: Secondary | ICD-10-CM | POA: Diagnosis not present

## 2021-05-03 DIAGNOSIS — F419 Anxiety disorder, unspecified: Secondary | ICD-10-CM

## 2021-05-03 MED ORDER — LORAZEPAM 0.5 MG PO TABS: 0.5000 mg | ORAL_TABLET | Freq: Every day | ORAL | 0 refills | Status: DC | PRN

## 2021-05-03 MED ORDER — VORTIOXETINE HBR 20 MG PO TABS: 20.0000 mg | ORAL_TABLET | Freq: Every day | ORAL | 1 refills | Status: DC

## 2021-05-03 NOTE — Progress Notes (Signed)
[facility] MD Progress Note Note  [name] [id number] 56 y.o.  05/03/2021 3:53 PM  History of Present Illness:  Patient came for her follow-up appointment.  She is a 56 year old married unemployed female who was seen first time on February 4.  She was not happy with the previous provider.  She was taking Abilify and BuSpar and Zoloft for a while and has not seen any improvement.  Patient today came with a lot of concerns, complaints and feeling not getting better with any medication.  We started her on Trintellix and she is taking 10 mg.  She has not seen any improvement but she also has not seen any deterioration in her symptoms.  She feels lot of anxiety and nervousness.  She reported feeling tired, unmotivated to do things.  She gets easily distracted in the conversation.  She like to restart Adderall which she has taken in the past to help her energy.  She has no formal psychological testing for ADHD.  She lives with her 73 year old son who has Asperger.  Patient reported sometimes she feels that she has asperger but she is not sure.  She endorsed a lot of ruminative thoughts.  Though she denies any suicidal thoughts or homicidal thought but sadness, fatigue, lack of energy.  She has diabetes, cardiomyopathy and back pain.  She reported her husband is very loving and she like to live a normal life.  We have recommended therapist but patient reported unless she feels her depression is not improved she cannot consider therapy.  She denies any hallucination, paranoia, suicidal thoughts.  She denies any nightmares.  Past Psychiatric History: H/O depression and anxiety since age 28 started in [place].  H/O two inpatient treatment at [facility]. Saw Dr. Parke Poisson and prescribed BuSpar, Abilify, Klonopin, Zoloft and Lamictal.  Cymbalta made suicidal and Prozac made more depressed.  Denies any history of mania, psychosis.    Family History: Multiple family member has psychiatric  disorder.  Past Medical History:  Diagnosis Date   ADD (attention deficit disorder)    Anxiety    Arthritis    both knees right worse than left   Carpal tunnel syndrome of right wrist    Depression    Essential hypertension, benign    Gallstones    History of smoking 06/22/2016   Hyperlipidemia associated with type 2 diabetes mellitus ([place]), on Zocor 04/04/2011   Hypertension associated with diabetes ([place]) 06/03/2008   Migraines    Morbid obesity ([place])    Morbid obesity with BMI of 50.0-59.9, adult ([place]) 07/12/2006   NICM (nonischemic cardiomyopathy) ([place]) 07/12/2006   01/16/18 ECHO:    - Procedure narrative: Transthoracic echocardiography. Image   quality was suboptimal. The study was technically difficult.   Intravenous contrast (Definity) was administered. - Left ventricle: The cavity size was moderately dilated. Wall   thickness was increased in a pattern of mild LVH. Systolic   function was moderately to severely reduced. The estimated   ejection fraction w   Normal coronary arteries 04/17/2016   OSA on CPAP 10/28/2014   Spinal stenosis    back pain   Spondylosis without myelopathy or radiculopathy, lumbar region 12/04/2017   Type 2 diabetes mellitus with hyperglycemia [facility])      Neurologic: Headache: No Seizure: No Paresthesias: No   Outpatient Encounter Medications as of 05/03/2021  Medication Sig   carvedilol (COREG) 12.5 MG tablet Take 1 tablet (12.5 mg total) by mouth 2 (two) times daily with a meal.  Dapagliflozin-metFORMIN HCl ER (XIGDUO XR) 11-998 MG TB24 Take 1 tablet by mouth daily.   furosemide (LASIX) 80 MG tablet Take 1 tablet (80 mg total) by mouth daily.   LINZESS 145 MCG CAPS capsule TAKE 1 CAPSULE BY MOUTH DAILY BEFORE BREAKFAST.   nystatin (MYCOSTATIN/NYSTOP) powder Apply 1 application topically 3 (three) times daily.   potassium chloride SA (KLOR-CON M20) 20 MEQ tablet Take 2 tablets (40 mEq total) by mouth daily.   sacubitril-valsartan (ENTRESTO) 97-103 MG  Take 1 tablet by mouth 2 (two) times daily.   simvastatin (ZOCOR) 20 MG tablet Take 1 tablet (20 mg total) by mouth daily.   vortioxetine HBr (TRINTELLIX) 10 MG TABS tablet Take 1 tablet (10 mg total) by mouth daily. After completing a week of the 5 mg.   vortioxetine HBr (TRINTELLIX) 5 MG TABS tablet Take 1 tablet (5 mg total) by mouth daily. For 7 days then discontinue and start 10 mg daily.   No facility-administered encounter medications on file as of 05/03/2021.    Recent Results (from the past 2160 hour(s))  Urinalysis, Routine w reflex microscopic     Status: Abnormal   Collection Time: 03/01/21  3:30 PM  Result Value Ref Range   Color, Urine YELLOW Yellow;Lt. Yellow;Straw;Dark Yellow;Amber;Green;Red;Brown   APPearance Turbid (A) Clear;Turbid;Slightly Cloudy;Cloudy   Specific Gravity, Urine 1.025 1.000 - 1.030   pH 5.5 5.0 - 8.0   Total Protein, Urine NEGATIVE Negative   Urine Glucose 500 (A) Negative   Ketones, ur NEGATIVE Negative   Bilirubin Urine NEGATIVE Negative   Hgb urine dipstick NEGATIVE Negative   Urobilinogen, UA 0.2 0.0 - 1.0   Leukocytes,Ua NEGATIVE Negative   Nitrite NEGATIVE Negative   WBC, UA 0-2/hpf 0-2/hpf   RBC / HPF none seen 0-2/hpf   Squamous Epithelial / LPF Rare(0-4/hpf) Rare(0-4/hpf)   Uric Acid Crys, UA Presence of (A) None   Amorphous Present (A) None;Present  Hepatic function panel     Status: None   Collection Time: 03/01/21  3:30 PM  Result Value Ref Range   Total Bilirubin 0.8 0.2 - 1.2 mg/dL   Bilirubin, Direct 0.2 0.0 - 0.3 mg/dL   Alkaline Phosphatase 44 39 - 117 U/L   AST 13 0 - 37 U/L   ALT 15 0 - 35 U/L   Total Protein 7.6 6.0 - 8.3 g/dL   Albumin 3.9 3.5 - 5.2 g/dL  Hemoglobin A1c     Status: Abnormal   Collection Time: 03/01/21  3:30 PM  Result Value Ref Range   Hgb A1c MFr Bld 6.9 (H) 4.6 - 6.5 %    Comment: Glycemic Control Guidelines for People with Diabetes:Non Diabetic:  <6%Goal of Therapy: <7%Additional Action Suggested:   >8%   Microalbumin / creatinine urine ratio     Status: None   Collection Time: 03/01/21  3:30 PM  Result Value Ref Range   Microalb, Ur 1.6 0.0 - 1.9 mg/dL   Creatinine,U 102.8 mg/dL   Microalb Creat Ratio 1.6 0.0 - 30.0 mg/g  Lipid panel     Status: None   Collection Time: 03/01/21  3:30 PM  Result Value Ref Range   Cholesterol 176 0 - 200 mg/dL    Comment: ATP III Classification       Desirable:  < 200 mg/dL               Borderline High:  200 - 239 mg/dL          High:  > =  240 mg/dL   Triglycerides 110.0 0.0 - 149.0 mg/dL    Comment: Normal:  <150 mg/dLBorderline High:  150 - 199 mg/dL   HDL 67.60 >39.00 mg/dL   VLDL 22.0 0.0 - 40.0 mg/dL   LDL Cholesterol 86 0 - 99 mg/dL   Total CHOL/HDL Ratio 3     Comment:                Men          Women1/2 Average Risk     3.4          3.3Average Risk          5.0          4.42X Average Risk          9.6          7.13X Average Risk          15.0          11.0                       NonHDL 108.49     Comment: NOTE:  Non-HDL goal should be 30 mg/dL higher than patient's LDL goal (i.e. LDL goal of < 70 mg/dL, would have non-HDL goal of < 100 mg/dL)  HM DIABETES EYE EXAM     Status: None   Collection Time: 04/04/21 12:00 AM  Result Value Ref Range   HM Diabetic Eye Exam No Retinopathy No Retinopathy      Constitutional:  BP (!) 151/79 (BP Location: Right Arm, Patient Position: Sitting, Cuff Size: Large)    Pulse 87    Temp 98.5 F (36.9 C) (Oral)    Ht '5\' 4"'$  (1.626 m)    Wt (!) 321 lb 3.2 oz (145.7 kg)    SpO2 91%    BMI 55.13 kg/m    Musculoskeletal: Strength & Muscle Tone: within normal limits Gait & Station: normal Patient leans: N/A  Psychiatric Specialty Exam: Physical Exam  Review of Systems  Blood pressure (!) 151/79, pulse 87, temperature 98.5 F (36.9 C), temperature source Oral, height '5\' 4"'$  (1.626 m), weight (!) 321 lb 3.2 oz (145.7 kg), SpO2 91 %.There is no height or weight on file to calculate BMI.  General Appearance:  Casual  Eye Contact:  Fair  Speech:  Slow  Volume:  Decreased  Mood:  Anxious, Depressed, and Dysphoric  Affect:  Constricted and Depressed  Thought Process:  Descriptions of Associations: Intact  Orientation:  Full (Time, Place, and Person)  Thought Content:  Rumination  Suicidal Thoughts:  No  Homicidal Thoughts:  No  Memory:  Immediate;   Good Recent;   Good Remote;   Fair  Judgement:  Fair  Insight:  Shallow  Psychomotor Activity:  Decreased  Concentration:  Concentration: Fair and Attention Span: Fair  Recall:  Good  Fund of Knowledge:  Good  Language:  Good  Akathisia:  No  Handed:  Right  AIMS (if indicated):     Assets:  Communication Skills Desire for Improvement Housing Transportation  ADL's:  Intact  Cognition:  WNL  Sleep:   on and off     Assessment/Plan: Major depressive disorder, recurrent.  Anxiety.  Patient had a lot of ruminative thoughts.  She has not seen a huge improvement with the Trintellix but also has not seen any deterioration.  She is asking any treatment that can offer my provider to help her depression.  Given the medical history and  prior treatment with psychotropic medication I offer [place].  Patient is not sure if she like to consider [place] but willing to try higher dose of Trintellix.  I also recommend she should try low-dose Ativan to help her anxiety to take as needed.  We talked about benzodiazepine dependence tolerance and withdrawal.  In the past she has taken Klonopin prescribed by Dr. Parke Poisson but that did not help her.  I also reviewed Gene-sight testing results with her and medicines are in favorable section.  She like to try higher dose of Trintellix and Ativan as needed and if that did not help she is open about TMS and consider therapy.  We will call Trintellix 20 mg daily and Ativan 0.5 mg to take as needed for severe anxiety.  Recommended to call us back if is any question or any concern.  Follow-up in 6 weeks.   Follow Up  Instructions:    I discussed the assessment and treatment plan with the patient. The patient was provided an opportunity to ask questions and all were answered. The patient agreed with the plan and demonstrated an understanding of the instructions.   The patient was advised to call back or seek an in-person evaluation if the symptoms worsen or if the condition fails to improve as anticipated.    Kathlee Nations, MD

## 2021-05-14 ENCOUNTER — Other Ambulatory Visit (HOSPITAL_COMMUNITY): Payer: Self-pay | Admitting: *Deleted

## 2021-05-14 ENCOUNTER — Telehealth (HOSPITAL_COMMUNITY): Payer: Self-pay | Admitting: *Deleted

## 2021-05-14 DIAGNOSIS — F332 Major depressive disorder, recurrent severe without psychotic features: Secondary | ICD-10-CM

## 2021-05-14 MED ORDER — VORTIOXETINE HBR 20 MG PO TABS: ORAL_TABLET | ORAL | 1 refills | Status: DC

## 2021-05-14 NOTE — Telephone Encounter (Signed)
Writer spoke with pt who called upset that there was no script for Trintellix at her pharmacy. Pt has received samples. Pharmacy never received the script from 05/03/21. Writer confirmed this with pt pharmacy. Pt has been out for two days. She was being titrated up to 20 mg, ok to send Rx for 20 mg? ?

## 2021-05-14 NOTE — Telephone Encounter (Signed)
Yes

## 2021-05-16 ENCOUNTER — Ambulatory Visit: Payer: Medicare Other | Admitting: Cardiovascular Disease

## 2021-05-16 ENCOUNTER — Ambulatory Visit: Payer: Medicare Other | Admitting: *Deleted

## 2021-05-16 DIAGNOSIS — E119 Type 2 diabetes mellitus without complications: Secondary | ICD-10-CM

## 2021-05-16 DIAGNOSIS — I5022 Chronic systolic (congestive) heart failure: Secondary | ICD-10-CM

## 2021-05-17 NOTE — Chronic Care Management (AMB) (Signed)
?Chronic Care Management  ? ?CCM RN Visit Note ? ?05/17/2021 ?Name: [name] MRN: [id number] DOB: [birth year] ? ?Subjective: ?[name] is a 56 y.o. year old female who is a primary care patient of [name], MD. The care management team was consulted for assistance with disease management and care coordination needs.   ? ?Engaged with patient by telephone for follow up visit in response to provider referral for case management and/or care coordination services.  ? ?Consent to Services:  ?The patient was given information about Chronic Care Management services, agreed to services, and gave verbal consent prior to initiation of services.  Please see initial visit note for detailed documentation.  ?Patient agreed to services and verbal consent obtained.  ? ?Assessment: Review of patient past medical history, allergies, medications, health status, including review of consultants reports, laboratory and other test data, was performed as part of comprehensive evaluation and provision of chronic care management services.  ? ?SDOH (Social Determinants of Health) assessments and interventions performed:  ?SDOH Interventions   ? ?Flowsheet Row Most Recent Value  ?SDOH Interventions   ?Housing Interventions Intervention Not Indicated  [continues to live with her husband and her son]  ?Transportation Interventions Intervention Not Indicated  [reports husband continues to provide transportation]  ? ?  ? ?CCM Care Plan ? ?Allergies  ?Allergen Reactions  ? Fluoxetine Other (See Comments)  ?  More depressed  ? Cymbalta [Duloxetine Hcl] Other (See Comments)  ?  depressed  ? Penicillins   ?  REACTION: rash ?Has patient had a PCN reaction causing immediate rash, facial/tongue/throat swelling, SOB or lightheadedness with hypotension: YES ?Has patient had a PCN reaction causing severe rash involving mucus membranes or skin necrosis: NO ?Has patient had a PCN reaction that required hospitalization: YES ?Has patient had a PCN  reaction occurring within the last 10 years: NO ?If all of the above answers are "NO", then may proceed with Cephalosporin use. ?  ? ?Outpatient Encounter Medications as of 05/16/2021  ?Medication Sig  ? carvedilol (COREG) 12.5 MG tablet Take 1 tablet (12.5 mg total) by mouth 2 (two) times daily with a meal.  ? Dapagliflozin-metFORMIN HCl ER (XIGDUO XR) 11-998 MG TB24 Take 1 tablet by mouth daily.  ? furosemide (LASIX) 80 MG tablet Take 1 tablet (80 mg total) by mouth daily.  ? LINZESS 145 MCG CAPS capsule TAKE 1 CAPSULE BY MOUTH DAILY BEFORE BREAKFAST.  ? LORazepam (ATIVAN) 0.5 MG tablet Take 1 tablet (0.5 mg total) by mouth daily as needed for anxiety.  ? nystatin (MYCOSTATIN/NYSTOP) powder Apply 1 application topically 3 (three) times daily.  ? potassium chloride SA (KLOR-CON M20) 20 MEQ tablet Take 2 tablets (40 mEq total) by mouth daily.  ? sacubitril-valsartan (ENTRESTO) 97-103 MG Take 1 tablet by mouth 2 (two) times daily.  ? simvastatin (ZOCOR) 20 MG tablet Take 1 tablet (20 mg total) by mouth daily.  ? vortioxetine HBr (TRINTELLIX) 20 MG TABS tablet Take one tablet by mouth (20 mg total) daily.  ? ?No facility-administered encounter medications on file as of 05/16/2021.  ? ?Patient Active Problem List  ? Diagnosis Date Noted  ? Tinea corporis 03/14/2021  ? Hyperlipidemia LDL goal <100 03/01/2021  ? DDD (degenerative disc disease), lumbar 08/08/2020  ? Non-seasonal allergic rhinitis due to pollen 07/25/2020  ? Chronic idiopathic constipation 07/25/2020  ? Visit for screening mammogram 02/11/2020  ? Chronic bilateral low back pain without sciatica 02/10/2020  ? Spondylosis without myelopathy or radiculopathy, lumbar region 12/04/2017  ?  Type 2 diabetes mellitus with hyperglycemia (HCC)   ? Normal coronary arteries 04/17/2016  ? OSA on CPAP 10/28/2014  ? Major depressive disorder 10/28/2014  ? Chronic systolic CHF (congestive heart failure), NYHA class 3 ([place]) 12/06/2013  ? Hyperlipidemia associated with type 2  diabetes mellitus ([place]), on Zocor 04/04/2011  ? Abnormal electrocardiography 01/16/2009  ? Hypertension associated with diabetes ([place]) 06/03/2008  ? Morbid obesity with BMI of 50.0-59.9, adult ([place]) 07/12/2006  ? NICM (nonischemic cardiomyopathy) ([place]) 07/12/2006  ? Attention deficit disorder 07/12/2006  ? Migraine without status migrainosus, not intractable 07/12/2006  ? ?Conditions to be addressed/monitored:  CHF, DMII, and Depression ? ?Care Plan : RN Care Manager Plan of Care  ?Updates made by Knox Royalty, RN since 05/17/2021 12:00 AM  ?  ? ?Problem: Chronic Disease Management Needs   ?Priority: High  ?  ? ?Long-Range Goal: Ongoing adherence to established Plan of Care for long term chronic disease management   ?Start Date: 03/13/2021  ?Expected End Date: 03/13/2022  ?Priority: High  ?Note:   ?Current Barriers:  ?Chronic Disease Management support and education needs related to CHF, DMII, and severe depression ?Chronic back pain: reports "9/10" chronic back on daily basis: states ongoing pain prevents her from going upstairs to sleep in bedroom, she is currently sleeping downstairs; reports chronic pain makes her unable to do iADL's and her husband is essentially her caregiver for both ADL's and iADL's; she declines referral for pain management provider; states she rarely takes medications for pain; patient also reports that in the past PCP has referred her to outpatient rehabilitation/ PT, however, she never followed up because she is "so immobilized by depression and pain" that she could "never bring herself" to call and schedule with rehabilitation team-- she is currently not interested in another outpatient rehabilitation referral ?Ongoing depression- reports long-standing history depression in her family and herself; reports she had psychiatric provider that she "liked" very much, however, she did not feel that she was making progress in therapy with previous psychiatric provider: states she did not  believe the medications provider prescribed was helping, and "all he wanted to do was to increase the doses;" today, 03/13/21- she confirms that she is not under psychiatric care and that she is NOT taking prescribed medications for depression ?04/17/21: Patient confirms she had initial visit with psychiatric provider; today she tells me that her follow up in-person visit with psychiatric provider was cancelled by the provider, and that the office team is unable to re-schedule a timely appointment; she is very frustrated by this and feels she needs to be seen in person by provider as soon as possible so that she can be placed on the "right" antidepressant -- she agrees today to continue her efforts to follow up to ensure that this appointment is re- scheduled ?05/16/21: confirms she attended in-person office visit with psychiatric provider 05/03/21; confirms she will have ongoing follow up with provider- this was encouraged ?Self- acknowledges lack of motivation in adhering to plan of care around state of health: she attributes this to both her ongoing severe depression and her chronic back pain ?Need for resources for affordable dental services and for food acquisition for FRESH food only: has tried other food pantries, but "they are all unhealthy food choices/ canned" food, which patient is not interested in: [place] referral placed ?04/17/21: confirmed [employer] reached out to patient several times, without success: provided patient with number for care guide should she wish  to engage with them in the future to obtain resources ?05/16/21: confirmed patient spoke with [employer] care guide 04/26/21; confirms she received printed information that was mailed to her- however, she has not yet read or acted on the resources that were provided- she was encouraged to do so promptly ?Need for DME: reports she would like a rollator walker, "for a large person:" states she asked PCP  about this at time of PCP office visit and has not yet heard anything; I will reach out to PCP to re-request on patient's behalf ?04/10/21: second request for requested DME placed with PCP office team ?04/17/21:

## 2021-05-17 NOTE — Patient Instructions (Signed)
Visit Information ? ?Vantasia, thank you for taking time to talk with me today. Please don't hesitate to contact me if I can be of assistance to you before our next scheduled telephone appointment ? ?Below are the goals we discussed today:  ?Patient Self-Care Activities: ?Patient Evangeline will: ?Take medications as prescribed ?Attend all scheduled provider appointments ?Call pharmacy for medication refills ?Call provider office for new concerns or questions ?Continue your efforts to follow heart healthy, low salt, low cholesterol, carbohydrate-modified, low sugar diet ?Keep up the great work preventing falls- continue using your new walker ?Please continue seeing your established psychiatric doctor regularly ?Please stay in touch with the Clinical Social Worker for assistance with management of your depression      ? ?Our next scheduled telephone follow up visit/ appointment is scheduled on:   Friday, Jun 29, 2021 at 3:00 pm- This is a PHONE CALL appointment ? ?If you need to cancel or re-schedule our visit, please call [phone] and our care guide team will be happy to assist you. ?  ?I look forward to hearing about your progress. ?  ?Oneta Rack, RN, BSN, CCRN Alumnus ?[place] ?([phone]: direct office ? ?If you are experiencing a Mental Health or [place] or need someone to talk to, please  ?call the Suicide and Crisis Lifeline: 988 ?call the [place] National Suicide Prevention Lifeline: 603-407-9072 or TTY: 413-104-3759 TTY (907)009-4773) to talk to a trained counselor ?call 1-800-273-TALK (toll free, 24 hour hotline) ?go to [facility] [address] [place] [phone]) ?call 911  ? ?The patient verbalized understanding of instructions, educational materials, and care plan provided today and agreed to receive a mailed copy of patient instructions, educational materials, and care plan ? ?Living With  Heart Failure ?Heart failure is a long-term (chronic) condition in which the heart cannot pump enough blood through the body. When this happens, parts of the body do not get the blood and oxygen they need. ?There is no cure for heart failure at this time, so it is important for you to take good care of yourself and follow the treatment plan you set with your health care provider. If you are living with heart failure, there are ways to help you manage the disease. ?How to manage lifestyle changes ?Living with heart failure requires you to make changes in your life. Your health care team will teach you about the changes you need to make in order to relieve your symptoms and lower your risk of going to the hospital. Work with your health care provider to develop a treatment plan that works for you. ?Activity ?Ask your health care provider about attending cardiac rehabilitation. These programs include aerobic physical activity, which provides many benefits for your heart. ?If no cardiac rehabilitation program is available, ask your health care provider what aerobic exercises are safe for you to do. ?Return to your normal activities as told by your health care provider. Ask your health care provider what activities are safe for you. ?Pace your daily activities and allow time for rest as needed. ?Managing stress ?It is normal to have many emotions about your diagnosis, such as fear, sadness, anger, and loss. If you feel any of these emotions and need help coping, contact your health care provider. Here are some ways to help yourself manage these emotions: ?Talk to friends and family members about your condition. They can give you support and guidance. Explain your symptoms  to them and, if comfortable, invite them to attend appointments or rehabilitation with you. ?Join a support group for people with chronic heart failure. Talking with other people who have the same symptoms may give you new ways of coping with your  disease and your emotions. ?Accept help from others. Do not be ashamed if you need help with certain tasks. ?Use stress management techniques, such as meditation, breathing exercises, or listening to relaxing music. ?Conditions such as depression and anxiety are common in persons with heart failure. Pay attention to changes in your mood, emotions, and stress levels. Tell your health care provider if you have any of the following symptoms: ?Trouble sleeping or a change in your sleeping patterns. ?Feeling sad, down, or depressed more often than not, every day for more than 2 weeks. ?Losing interest in activities you normally enjoy. ?Feeling irritable or crying for no reason. ?Finding yourself worrying about the future often. ?Work ?You may need to develop a plan with your health care provider if heart failure interferes with your ability to work. This may include: ?Reducing your work hours. ?Finding functions that are less active or require less effort. ?Planning rest periods during your work hours. ?Travel ?Talk with your health care provider if you plan to travel. There may be circumstances in which your health care provider recommends that you do not travel or that you delay travel until your condition is under control. ?When you travel, bring your medicine and a list of your medicines. If you are traveling by air, keep your medicines with you in a carry-on bag. ?Consider finding a medical facility in the area you will be traveling to and determine what your health insurance will cover. ?If you will be traveling by public transportation (airplane, train, bus), contact the company prior to traveling if you have special needs. This may include needs related to diet, oxygen, a wheelchair, a seating request, or help with luggage. ?If you use oxygen, make sure to bring enough oxygen with you. ?If you have a battery-powered device, bring a fully charged extra battery with you. ?If you have a device, bring a note from  your health care provider and inform all security screening personnel that you have the device. You may need to go through special screening for safety. ?Sexual activity ? ?Ask your health care provider when it is safe for you to resume sexual activity. ?You may need to start slowly and gradually increase intimacy. You can increase intimacy by doing such things as caressing, touching, and holding each other. ?Get regular exercise as told by your health care provider. This can benefit your sex life by building strength and endurance. ?Sleep ?If your condition interferes with your sleep, find ways to improve your sleep quality, such as: ?Sleep lying on your side, or sleep with your head elevated by raising the head of your bed or using multiple pillows. ?Ask your health care provider about screening for sleep apnea. ?Try to go to sleep and wake up at the same times every day. ?Sleep in a dark, cool room. ?Do not do any physical activity or eating for a few hours before bedtime. ?Plan rest periods during the day, but do not take long naps during the day. ? ?Where to find support ?Consider talking with: ?Family members. ?Close friends. ?A mental health professional or therapist. ?A member of your church, faith, or community group. ?Other sources of support include: ?Local support groups. Ask your health care provider about groups near you. ?Online support  groups, such as those found through the American Heart Association: supportnetwork.heart.org ?Local home care agencies, community agencies, or social agencies. ?A palliative care specialist. Palliative care can help you manage symptoms, promote comfort, improve quality of life, and maintain dignity. ?Where to find more information ?American Heart Association: heart.org ?National Heart, Lung, and Blood Institute: https://www.hartman-hill.biz/ ?Centers for Disease Control and Prevention: https://www.reeves.com/ ?Heart Failure Society of [place]: SolutionApps.it ?Contact a health care  provider if: ?You have a rapid weight gain. ?You have increasing shortness of breath that is unusual for you. ?You are unable to participate in your usual physical activities. ?You tire easily. ?You have difficulty

## 2021-05-21 ENCOUNTER — Encounter: Payer: Self-pay | Admitting: Cardiovascular Disease

## 2021-05-21 ENCOUNTER — Ambulatory Visit (INDEPENDENT_AMBULATORY_CARE_PROVIDER_SITE_OTHER): Payer: Medicare Other | Admitting: Cardiovascular Disease

## 2021-05-21 ENCOUNTER — Other Ambulatory Visit: Payer: Self-pay

## 2021-05-21 DIAGNOSIS — G4733 Obstructive sleep apnea (adult) (pediatric): Secondary | ICD-10-CM

## 2021-05-21 DIAGNOSIS — I5022 Chronic systolic (congestive) heart failure: Secondary | ICD-10-CM

## 2021-05-21 DIAGNOSIS — F32A Depression, unspecified: Secondary | ICD-10-CM | POA: Diagnosis not present

## 2021-05-21 DIAGNOSIS — E1169 Type 2 diabetes mellitus with other specified complication: Secondary | ICD-10-CM

## 2021-05-21 DIAGNOSIS — E785 Hyperlipidemia, unspecified: Secondary | ICD-10-CM | POA: Diagnosis not present

## 2021-05-21 DIAGNOSIS — I428 Other cardiomyopathies: Secondary | ICD-10-CM | POA: Diagnosis not present

## 2021-05-21 DIAGNOSIS — Z79899 Other long term (current) drug therapy: Secondary | ICD-10-CM

## 2021-05-21 NOTE — Progress Notes (Signed)
Patient ID: [name], female   DOB: 08-19-1965, 56 y.o.   MRN: [id number] ? ? ? ? ? ?39 month sleep f/u ? ?HPI: [name] is a 56 y.o. female who initially seen by me in 2016 for initial sleep clinic evaluation after initiation of CPAP therapy for obstructive sleep apnea.  I last saw her in January 2020. She presents for a f/u sleep evaluatiion after receiving a new CPAP machine. ? ?[name] is followed by Dr. Percival Spanish for primary cardiology care.  She has a history of coronary which initiated postpartum.  She is originally from [place], [place].  She has a history of obesity, palpitations and peripheral edema.  She had undergone a sleep study on April 22, 2014 and had complaints of she significant snoring and daytime fatigue.  She met split-night criteria.  She was found to have severe obstructive sleep apnea with an AHI of 33.1/h and significant oxygen desaturation with non-REM sleep to a nadir of 69%.  She was unable to achieve REM sleep  on the diagnostic evaluation.  With CPAP therapy, the lowest oxygen desaturation was 72% at 5 cm.  She ultimately was titrated up to 11 cm water pressure and during her study was started on 1 L of supplemental oxygen.  She subsequently initiated CPAP therapy with a ResMed air since 10 AutoSet unit set at this 11 cm pressure.  A download from Jul 22, 2014 through 08/20/2014 revealed Medicare compliance with usage states at 100% and days with greater than for his abuse at 80%.  She is averaging 6 hours and 49 minutes of use.  AHI was markedly improved in 3.3, with an apnea index of 1.6 and hyponea index of 1.7.  She was going to bed between 10 and 10:30 and waking up at 5 AM.  She uses nasal pillow mask.  She did not have any significant leak is noted on her download. ? ?She was unaware of breakthrough snoring, residual daytime sleepiness, bruxism or restless legs.  An Epworth Sleepiness Scale score endorsed at 9. ? ?Over the past several years, she is been followed by Dr.  Percival Spanish for cardiomyopathy.  An echo Doppler study November 2019 showed an EF of 30 to 35% with mild LVH and grade 1 diastolic dysfunction.  Last seen by Dr. Percival Spanish in December 2019 follow-up sleep evaluation was advised. ? ?I last saw her on March 09, 2018.  At that time, her machine was recently beginning to malfunction and was intermittently shutting off.    She admits to excellent compliance prior to the machine malfunction.  In the office I obtained a download from August 26, 2017 through January 25, 2018.  Usage days was 82% and she was averaging 5 hours and 33 minutes.  She has a ResMed air sense 10 AutoSet unit with a minimum pressure at 10 and maximum pressure at 20.  AHI was 3.4.  Her 95th percentile pressure was elevated at 17 with an average maximum pressure of 18.1.  Over the past month, her machine has not functioned consistently.  This has led to reduction in compliance.  Her DME company is [employer] [facility] [employer].  She had asked if I could recommend pulmonary rehab.  She has been participating in [employer]. ? ?Since I saw her, she received a new ResMed air sense 11 CPAP AutoSet unit on February 14, 2021.  Delay was exacerbated by supply chain issues.  She notices marked improvement with her new machine compared to her older unit.  A download was obtained from February 23 through May 18, 2021.  Usage is 100%.  However she does have moderate mask leak.  Her average use is 6 hours and 56 minutes per night.  Her CPAP unit is set at a pressure range of 10 to 20 cm of water and her 95th percentile pressure is 12.4 with maximum average pressure of 13.4.  AHI despite the leak is excellent at 1.5.  Clinically, she feels well.  However she has had difficulty with her back and knees and recently has been sleeping on a couch due to this discomfort.  An Epworth Sleepiness Scale score was calculated in the office today and this endorsed at 8 arguing against any residual daytime sleepiness.  She is unaware of  any breakthrough snoring.   ? ?Cardiac perspective, she is now on guideline directed medical therapy for cardiomyopathy with Entresto 97/103 mg twice daily, carvedilol 12.5 mg twice daily, furosemide 80 mg daily, and dapagliflozin/metformin combination.  She presents for follow-up sleep evaluation, ? ? ?Past Medical History:  ?Diagnosis Date  ? ADD (attention deficit disorder)   ? Anxiety   ? Arthritis   ? both knees right worse than left  ? Carpal tunnel syndrome of right wrist   ? Depression   ? Essential hypertension, benign   ? Gallstones   ? History of smoking 06/22/2016  ? Hyperlipidemia associated with type 2 diabetes mellitus ([place]), on Zocor 04/04/2011  ? Hypertension associated with diabetes ([place]) 06/03/2008  ? Migraines   ? Morbid obesity ([place])   ? Morbid obesity with BMI of 50.0-59.9, adult ([place]) 07/12/2006  ? NICM (nonischemic cardiomyopathy) ([place]) 07/12/2006  ? 01/16/18 ECHO:    - Procedure narrative: Transthoracic echocardiography. Image   quality was suboptimal. The study was technically difficult.   Intravenous contrast (Definity) was administered. - Left ventricle: The cavity size was moderately dilated. Wall   thickness was increased in a pattern of mild LVH. Systolic   function was moderately to severely reduced. The estimated   ejection fraction w  ? Normal coronary arteries 04/17/2016  ? OSA on CPAP 10/28/2014  ? Spinal stenosis   ? back pain  ? Spondylosis without myelopathy or radiculopathy, lumbar region 12/04/2017  ? Type 2 diabetes mellitus with hyperglycemia (H[facility])   ? ? ?Past Surgical History:  ?Procedure Laterality Date  ? CHOLECYSTECTOMY    ? DILATATION & CURETTAGE/HYSTEROSCOPY WITH MYOSURE N/A 10/31/2017  ? Procedure: DILATATION & CURETTAGE/HYSTEROSCOPY WITH MYOSURE;  Surgeon: Salvadore Dom, MD;  Location: [facility];  Service: Gynecology;  Laterality: N/A;  ? RIGHT/LEFT HEART CATH AND CORONARY ANGIOGRAPHY N/A 04/11/2016  ? Procedure: Right/Left Heart Cath and Coronary Angiography;  Surgeon:  Burnell Blanks, MD;  Location: [facility];  Service: Cardiovascular;  Laterality: N/A;  ? sonogram for blood clots    ? no blockages  ? ? ?Allergies  ?Allergen Reactions  ? Fluoxetine Other (See Comments)  ?  More depressed  ? Cymbalta [Duloxetine Hcl] Other (See Comments)  ?  depressed  ? Penicillins   ?  REACTION: rash ?Has patient had a PCN reaction causing immediate rash, facial/tongue/throat swelling, SOB or lightheadedness with hypotension: YES ?Has patient had a PCN reaction causing severe rash involving mucus membranes or skin necrosis: NO ?Has patient had a PCN reaction that required hospitalization: YES ?Has patient had a PCN reaction occurring within the last 10 years: NO ?If all of the above answers are "NO", then may proceed with Cephalosporin use. ?  ? ? ?  Current Outpatient Medications  ?Medication Sig Dispense Refill  ? carvedilol (COREG) 12.5 MG tablet Take 1 tablet (12.5 mg total) by mouth 2 (two) times daily with a meal. 180 tablet 3  ? Dapagliflozin-metFORMIN HCl ER (XIGDUO XR) 11-998 MG TB24 Take 1 tablet by mouth daily. 90 tablet 1  ? furosemide (LASIX) 80 MG tablet Take 1 tablet (80 mg total) by mouth daily. 90 tablet 3  ? LINZESS 145 MCG CAPS capsule TAKE 1 CAPSULE BY MOUTH DAILY BEFORE BREAKFAST. 90 capsule 1  ? LORazepam (ATIVAN) 0.5 MG tablet Take 1 tablet (0.5 mg total) by mouth daily as needed for anxiety. 20 tablet 0  ? nystatin (MYCOSTATIN/NYSTOP) powder Apply 1 application topically 3 (three) times daily. 120 g 3  ? potassium chloride SA (KLOR-CON M20) 20 MEQ tablet Take 2 tablets (40 mEq total) by mouth daily. 180 tablet 3  ? sacubitril-valsartan (ENTRESTO) 97-103 MG Take 1 tablet by mouth 2 (two) times daily. 180 tablet 3  ? simvastatin (ZOCOR) 20 MG tablet Take 1 tablet (20 mg total) by mouth daily. 90 tablet 1  ? vortioxetine HBr (TRINTELLIX) 20 MG TABS tablet Take one tablet by mouth (20 mg total) daily. 30 tablet 1  ? ?No current facility-administered medications  for this visit.  ? ? ?Social History  ? ?Socioeconomic History  ? Marital status: Married  ?  Spouse name: Not on file  ? Number of children: Not on file  ? Years of education: Not on file  ? Highest ed

## 2021-05-21 NOTE — Patient Instructions (Signed)
Medication Instructions:  ?No changes ?*If you need a refill on your cardiac medications before your next appointment, please call your pharmacy* ? ? ?Lab Work: ?None ordered ?If you have labs (blood work) drawn today and your tests are completely normal, you will receive your results only by: ?MyChart Message (if you have MyChart) OR ?A paper copy in the mail ?If you have any lab test that is abnormal or we need to change your treatment, we will call you to review the results. ? ? ?Testing/Procedures: ?None ordered ? ? ?Follow-Up: ?At [facility], you and your health needs are our priority.  As part of our continuing mission to provide you with exceptional heart care, we have created designated Provider Care Teams.  These Care Teams include your primary Cardiologist (physician) and Advanced Practice Providers (APPs -  Physician Assistants and Nurse Practitioners) who all work together to provide you with the care you need, when you need it. ? ?We recommend signing up for the patient portal called "MyChart".  Sign up information is provided on this After Visit Summary.  MyChart is used to connect with patients for Virtual Visits (Telemedicine).  Patients are able to view lab/test results, encounter notes, upcoming appointments, etc.  Non-urgent messages can be sent to your provider as well.   ?To learn more about what you can do with MyChart, go to [email or web address].   ? ?Your next appointment:   ?12 month(s) ? ?The format for your next appointment:   ?In Person ? ?Provider:   ?Dr. Claiborne Billings ?

## 2021-05-22 LAB — BASIC METABOLIC PANEL
BUN/Creatinine Ratio: 28 — ABNORMAL HIGH (ref 9–23)
BUN: 20 mg/dL (ref 6–24)
CO2: 26 mmol/L (ref 20–29)
Calcium: 9.5 mg/dL (ref 8.7–10.2)
Chloride: 104 mmol/L (ref 96–106)
Creatinine, Ser: 0.72 mg/dL (ref 0.57–1.00)
Glucose: 100 mg/dL — ABNORMAL HIGH (ref 70–99)
Potassium: 4 mmol/L (ref 3.5–5.2)
Sodium: 145 mmol/L — ABNORMAL HIGH (ref 134–144)
eGFR: 99 mL/min/{1.73_m2} (ref >59)

## 2021-05-22 NOTE — Progress Notes (Signed)
Stable renal function and electrolyte

## 2021-05-25 DIAGNOSIS — I5022 Chronic systolic (congestive) heart failure: Secondary | ICD-10-CM

## 2021-05-25 DIAGNOSIS — E119 Type 2 diabetes mellitus without complications: Secondary | ICD-10-CM

## 2021-05-25 DIAGNOSIS — F3341 Major depressive disorder, recurrent, in partial remission: Secondary | ICD-10-CM | POA: Diagnosis not present

## 2021-05-28 ENCOUNTER — Encounter: Payer: Self-pay | Admitting: Cardiovascular Disease

## 2021-05-29 ENCOUNTER — Ambulatory Visit (INDEPENDENT_AMBULATORY_CARE_PROVIDER_SITE_OTHER): Payer: Medicare Other | Admitting: Licensed Clinical Social Worker

## 2021-05-29 ENCOUNTER — Encounter: Payer: Self-pay | Admitting: Licensed Clinical Social Worker

## 2021-05-29 DIAGNOSIS — F332 Major depressive disorder, recurrent severe without psychotic features: Secondary | ICD-10-CM

## 2021-05-29 DIAGNOSIS — F902 Attention-deficit hyperactivity disorder, combined type: Secondary | ICD-10-CM

## 2021-05-29 NOTE — Chronic Care Management (AMB) (Signed)
?Chronic Care Management  ? ? Clinical Social Work Note ? ?05/29/2021 ?Name: [name] MRN: [id number] DOB: 1965/12/12 ? ?[name] is a 56 y.o. year old female who is a primary care patient of [name], MD. The CCM team was consulted to assist the patient with chronic disease management and/or care coordination needs related to:  Coordination of mental health needs and emotional support .  ? ?Engaged with patient by telephone for follow up visit in response to provider referral for social work chronic care management and care coordination services.  ? ?Consent to Services:  ?The patient was given information about Chronic Care Management services, agreed to services, and gave verbal consent prior to initiation of services.  Please see initial visit note for detailed documentation.  ?Patient agreed to services and consent obtained.  ? ?Summary: Assessed patient's current treatment, progress, coping skills, support system and barriers to care.  She continues to experience difficulty with managing her mental health even though she is connected with psychiatry for medication management. Reports medication is not working for her. She continues to feel overwhelmed with daily task and "feeling stuck" with little motivation to do the things she needs to do for herself and family . Reports difficulty expressing herself in refer to her treatment without getting frustrated .  See Care Plan below for interventions and patient self-care actives. ? ?Recommendation: Patient may benefit from, and is in agreement to talk with Dr. Adele Schilder to see if she would be a good candidate for advance mental health treatments ( ECT or [place]). Patient has requested assistance of LCSW to collaborate with psychiatry to explore all treatment options due to her poor response to current and previous mental health medication. LCSW will reach out to Dr. Adele Schilder. ? ?Follow up Plan: Patient would like continued follow-up from CCM LCSW.  per  patient's request will follow up in 30 days.  Will call office if needed prior to next encounter. ?  ?Assessment: Review of patient past medical history, allergies, medications, and health status, including review of relevant consultants reports was performed today as part of a comprehensive evaluation and provision of chronic care management and care coordination services.    ? ?SDOH (Social Determinants of Health) assessments and interventions performed:   ? ?Advanced Directives Status: Not addressed in this encounter. ? ?CCM Care Plan ?Conditions to be addressed/monitored: Anxiety and Depression; Mental Health Concerns  ? ?Care Plan : LCSW Plan of Care  ?Updates made by Maurine Cane, LCSW since 05/29/2021 12:00 AM  ?  ? ?Problem: Symptoms of Depression not managed   ?  ? ?Goal: Symptoms Monitored and Managed with medication and therapy   ?Start Date: 03/20/2021  ?This Visit's Progress: On track  ?Recent Progress: On track  ?Priority: High  ?Note:   ?Current Barriers:  ?Disease Management support and education needs related to Depression: depressed mood ?loss of energy/fatigue ?difficulty concentrating ? ?CSW Clinical Goal(s):  ?Patient  will work with mental health provider and LCSW to address needs related to symptoms of depression  through collaboration with Clinical Social Worker, provider, and care team.  ?Patient will work with LCSW to assist coordination of mental health needs ? ?Interventions: ?1:1 collaboration with primary care provider regarding development and update of comprehensive plan of care as evidenced by provider attestation and co-signature ?Inter-disciplinary care team collaboration (see longitudinal plan of care) ?Evaluation of current treatment plan related to  self management and patient's adherence to plan as established by provider ? ?Mental  Health:  (Status: Goal on Track (progressing): YES.) with connecting to mental health provider, however does not feel like she is making  progress ?Evaluation of current treatment plan related to ADHD, Mood Instability, and symptoms of depression  ?Solution-Focused Strategies employed:  ?Active listening / Reflection utilized  ?Emotional Support Provided ?Problem Solving /Task Center strategies reviewed ?Provided psychoeducation for mental health needs  ?Reviewed mental health medications and discussed importance of compliance: reports no missed dose ?Crisis Resource Education / information provided  ?Collaborated with Dr. Adele Schilder, Psychiatrist per patient's request ?Expressed other health related concerns will share with CCM RN ?( Has rolling walker; has Nystop powder for breast; dentist ; ? Needs to make appointments for breast center; and dermatology appointment) ? ?Patient Self-Care Activities: ?Keep appointment with psychiatry on April 20th  ?Utilize the information below if you have a mental health crisis ?[place], [place] ?[place] [phone]  Crisis [phone] ? ?  ?  ?[name], LCSW ?Licensed Clinical Social Worker [employer]  ?[place]  ?[phone]  ? ?

## 2021-05-29 NOTE — Patient Instructions (Addendum)
Visit Information ? ?Thank you for taking time to visit with me today. Please don't hesitate to contact me if I can be of assistance to you before our next scheduled telephone appointment. ? ?Following are the goals we discussed today:  ?Patient Self-Care Activities: ?Keep appointment with psychiatry on April 20th  ?I sent a message to Dr. Adele Schilder about what you and I discussed ?Utilize the information below if you have a mental health crisis ?[place], [place] ?[place] [phone]  Crisis [phone] ? ?Our next appointment is by telephone on May 8th at 1:30 ? ?Please call the care guide team at [phone] if you need to cancel or reschedule your appointment.  ? ?If you are experiencing a Mental Health or [place] or need someone to talk to, please call the Suicide and Crisis Lifeline: 988 ?call the [place] National Suicide Prevention Lifeline: 971-458-6855 or TTY: (918)012-5024 TTY 986 656 5610) to talk to a trained counselor ?call 1-800-273-TALK (toll free, 24 hour hotline) ?go to [facility] [address] [place] [phone]) ?call 911  ? ?The patient verbalized understanding of instructions, educational materials, and care plan provided today and agreed to receive a mailed copy of patient instructions, educational materials, and care plan.  ? ?[name], LCSW ?Licensed Clinical Social Worker [employer] [facility] [employer]  ?[place]  ?[phone]  ?

## 2021-06-11 ENCOUNTER — Ambulatory Visit: Payer: Medicare Other | Admitting: Obstetrics and Gynecology

## 2021-06-14 ENCOUNTER — Ambulatory Visit (HOSPITAL_BASED_OUTPATIENT_CLINIC_OR_DEPARTMENT_OTHER): Payer: Medicare Other | Admitting: Psychiatry

## 2021-06-14 ENCOUNTER — Encounter (HOSPITAL_COMMUNITY): Payer: Self-pay | Admitting: Psychiatry

## 2021-06-14 ENCOUNTER — Telehealth (HOSPITAL_COMMUNITY): Payer: Self-pay | Admitting: *Deleted

## 2021-06-14 ENCOUNTER — Telehealth: Payer: Self-pay | Admitting: Cardiology

## 2021-06-14 VITALS — BP 167/122 | HR 103 | Resp 20 | Ht 64.0 in | Wt 328.8 lb

## 2021-06-14 DIAGNOSIS — F419 Anxiety disorder, unspecified: Secondary | ICD-10-CM

## 2021-06-14 DIAGNOSIS — F332 Major depressive disorder, recurrent severe without psychotic features: Secondary | ICD-10-CM | POA: Diagnosis not present

## 2021-06-14 DIAGNOSIS — F988 Other specified behavioral and emotional disorders with onset usually occurring in childhood and adolescence: Secondary | ICD-10-CM

## 2021-06-14 MED ORDER — VORTIOXETINE HBR 20 MG PO TABS: ORAL_TABLET | ORAL | 1 refills | Status: DC

## 2021-06-14 MED ORDER — ATOMOXETINE HCL 18 MG PO CAPS: 18.0000 mg | ORAL_CAPSULE | Freq: Every day | ORAL | 0 refills | Status: DC

## 2021-06-14 NOTE — Telephone Encounter (Signed)
Will route to Dr. Percival Spanish for recommendations.  ? ?Psychiatry note is available in Epic.  Dr. Adele Schilder states in order to try a stimulant, pt would need approval from cardiology.  ?

## 2021-06-14 NOTE — Addendum Note (Signed)
Addended by: Berniece Andreas T on: 06/14/2021 03:23 PM ? ? Modules accepted: Level of Service ? ?

## 2021-06-14 NOTE — Progress Notes (Addendum)
? ? ?[facility] ?MD Progress Note Note ? ? ? ?Patient Location: In person ?Provider Location: Office ? ? ?[name] ?[id number] ?56 y.o. ? ?06/14/2021 ?2:05 PM ? ?History of Present Illness:  ?Patient came for her follow-up appointment in person.  On the last visit we started her on Trintellix.  Patient taking 20 mg.  She reported to still struggle with depression, anxiety, lack of energy, fatigue and no motivation to do things.  She insists that she wants to go back on Adderall which she has taken years ago.  Patient reported that the medicine helped her motivation.  It was discontinued by previous psychiatrist after she had a low ejection fraction.  She had tried Abilify, Prozac, Lamictal, Zoloft, BuSpar but reported none of these medicine helped.  She started to cry and very emotional during the session that no one understands her symptoms.  She struggle to go out.  She reported decrease in personal hygiene and neglect.  She stays in her bed all day.  She gets distracted and difficult to keep her task on time.  Her 61 year old son has Asperger.  She feels her symptoms are the same that he had it.  We have done gene psych testing and current medicines are recommended to take as directed.  Even though Cymbalta is also a good option but patient had tried in the past which she believes make her more depressed.  She denies any suicidal thoughts but insists that she feels sometimes hopeless and worthless.  She feels her life is not going anywhere and she looks like a old lady.  She had a lot of ruminative thoughts.  She gets sensitive, irritable and very emotional.  We have offered [place] and therapy but patient at this time not interested.  Today her blood pressure is also very high and she did not took her medication.  Patient has diabetes, cardiomyopathy, back pain.  She denies any hallucinations. ? ?Past Psychiatric History: ?H/O depression and anxiety since age 56 started in [place].  H/O two inpatient  treatment at [facility]. Saw Dr. Parke Poisson and prescribed BuSpar, Abilify, Klonopin, Zoloft and Lamictal.  Cymbalta made suicidal and Prozac made more depressed.  Denies any history of mania, psychosis.   ? ?Family History: ?Multiple family member has psychiatric disorder. ? ?Past Medical History:  ?Diagnosis Date  ? ADD (attention deficit disorder)   ? Anxiety   ? Arthritis   ? both knees right worse than left  ? Carpal tunnel syndrome of right wrist   ? Depression   ? Essential hypertension, benign   ? Gallstones   ? History of smoking 06/22/2016  ? Hyperlipidemia associated with type 2 diabetes mellitus ([place]), on Zocor 04/04/2011  ? Hypertension associated with diabetes ([place]) 06/03/2008  ? Migraines   ? Morbid obesity ([place])   ? Morbid obesity with BMI of 50.0-59.9, adult ([place]) 07/12/2006  ? NICM (nonischemic cardiomyopathy) ([place]) 07/12/2006  ? 01/16/18 ECHO:    - Procedure narrative: Transthoracic echocardiography. Image   quality was suboptimal. The study was technically difficult.   Intravenous contrast (Definity) was administered. - Left ventricle: The cavity size was moderately dilated. Wall   thickness was increased in a pattern of mild LVH. Systolic   function was moderately to severely reduced. The estimated   ejection fraction w  ? Normal coronary arteries 04/17/2016  ? OSA on CPAP 10/28/2014  ? Spinal stenosis   ? back pain  ? Spondylosis without myelopathy or radiculopathy, lumbar region 12/04/2017  ?  Type 2 diabetes mellitus with hyperglycemia (HCC)   ? ? ? ?Neurologic: ?Headache: No ?Seizure: No ?Paresthesias: No ? ? ?Outpatient Encounter Medications as of 06/14/2021  ?Medication Sig  ? carvedilol (COREG) 12.5 MG tablet Take 1 tablet (12.5 mg total) by mouth 2 (two) times daily with a meal.  ? Dapagliflozin-metFORMIN HCl ER (XIGDUO XR) 11-998 MG TB24 Take 1 tablet by mouth daily.  ? furosemide (LASIX) 80 MG tablet Take 1 tablet (80 mg total) by mouth daily.  ? LINZESS 145 MCG CAPS capsule TAKE 1 CAPSULE BY MOUTH DAILY  BEFORE BREAKFAST.  ? LORazepam (ATIVAN) 0.5 MG tablet Take 1 tablet (0.5 mg total) by mouth daily as needed for anxiety.  ? nystatin (MYCOSTATIN/NYSTOP) powder Apply 1 application topically 3 (three) times daily.  ? potassium chloride SA (KLOR-CON M20) 20 MEQ tablet Take 2 tablets (40 mEq total) by mouth daily.  ? sacubitril-valsartan (ENTRESTO) 97-103 MG Take 1 tablet by mouth 2 (two) times daily.  ? simvastatin (ZOCOR) 20 MG tablet Take 1 tablet (20 mg total) by mouth daily.  ? vortioxetine HBr (TRINTELLIX) 20 MG TABS tablet Take one tablet by mouth (20 mg total) daily.  ? ?No facility-administered encounter medications on file as of 06/14/2021.  ? ? ?Recent Results (from the past 2160 hour(s))  ?HM DIABETES EYE EXAM     Status: None  ? Collection Time: 04/04/21 12:00 AM  ?Result Value Ref Range  ? HM Diabetic Eye Exam No Retinopathy No Retinopathy  ?HM DIABETES EYE EXAM     Status: None  ? Collection Time: 04/04/21  2:36 PM  ?Result Value Ref Range  ? HM Diabetic Eye Exam No Retinopathy No Retinopathy  ?Basic metabolic panel     Status: Abnormal  ? Collection Time: 05/21/21  3:56 PM  ?Result Value Ref Range  ? Glucose 100 (H) 70 - 99 mg/dL  ? BUN 20 6 - 24 mg/dL  ? Creatinine, Ser 0.72 0.57 - 1.00 mg/dL  ? eGFR 99 >59 mL/min/1.73  ? BUN/Creatinine Ratio 28 (H) 9 - 23  ? Sodium 145 (H) 134 - 144 mmol/L  ? Potassium 4.0 3.5 - 5.2 mmol/L  ? Chloride 104 96 - 106 mmol/L  ? CO2 26 20 - 29 mmol/L  ? Calcium 9.5 8.7 - 10.2 mg/dL  ? ? ? ? ?Constitutional: ? Wt (!) 321 lb (145.6 kg)   BMI 55.10 kg/m?  ? ? ?Musculoskeletal: ?Strength & Muscle Tone: within normal limits ?Gait & Station: normal ?Patient leans: N/A ? ? ?Psychiatric Specialty Exam: ?Physical Exam  ?Review of Systems  ?Weight (!) 321 lb (145.6 kg).There is no height or weight on file to calculate BMI.  ?General Appearance: Fairly Groomed  ?Eye Contact:  Fair  ?Speech:   Fast  ?Volume:  Increased  ?Mood:  Depressed, Dysphoric, and Irritable  ?Affect:   Constricted and Depressed  ?Thought Process:  Descriptions of Associations: Circumstantial  ?Orientation:  Full (Time, Place, and Person)  ?Thought Content:  Rumination  ?Suicidal Thoughts:  No  ?Homicidal Thoughts:  No  ?Memory:  Immediate;   Good ?Recent;   Fair ?Remote;   Fair  ?Judgement:  Fair  ?Insight:  Shallow  ?Psychomotor Activity:  Decreased  ?Concentration:  Concentration: Fair and Attention Span: Fair  ?Recall:  Fair  ?Fund of Knowledge:  Fair  ?Language:  Good  ?Akathisia:  No  ?Handed:  Right  ?AIMS (if indicated):     ?Assets:  Communication Skills ?Desire for Improvement ?Housing ?Transportation  ?ADL's:  Intact  ?Cognition:  WNL  ?Sleep:   fair  ? ? ? ?Assessment/Plan: ?Major depressive disorder, recurrent.  Anxiety.  ADD, inattentive type. ? ?I reviewed previous notes, current medication, medical history, recent blood work results.  Patient insists to try stimulants.  Given the history of low ejection fraction, hypertension I really like cardiology input to reconsider stimulant.  We have discussed with her previous medication.  She used to take Adderall for years ago but it was discontinued.  I again offer [place] in therapy but patient at this time not interested.  Recommend to try nonstimulant Strattera 18 mg daily for now and if she feels some marginal improvement then she can take twice a day.  In order to consider stimulant she has to be cleared from cardiology and have formal psychological testing to establish diagnosis of ADD.  We will refer her to psychological testing.  Patient like to keep the Trintellix 20 mg.  She has not taken Ativan recently.  Follow-up in 3 weeks.  Patient will contact her cardiology to talk about taking stimulants.  I recommended to call us back if she has any question or any concern. ? ?Collaboration of Care: Other provider involved in patient's care AEB notes are available in epic to review ? ?Patient/Guardian was advised Release of Information must be obtained prior  to any record release in order to collaborate their care with an outside provider. Patient/Guardian was advised if they have not already done so to contact the registration department to sign all necessary forms

## 2021-06-14 NOTE — Telephone Encounter (Signed)
?  Pt c/o medication issue: ? ?1. Name of Medication: adderall or ritalin ? ? vortioxetine HBr (TRINTELLIX) 20 MG TABS tablet  ?atomoxetine (STRATTERA) 18 MG capsule - added today ? ?2. How are you currently taking this medication (dosage and times per day)?  ? ?3. Are you having a reaction (difficulty breathing--STAT)?  ? ?4. What is your medication issue? Pt said, she saw her psychiatrist today and the trintellix that she been taking for a month is not working, she is prescribed today for strattera and her doctor also wanted to add adderall or ritalin but Dr. Viviana Simpler needs a letter of approval from Dr. Percival Spanish that she is ok to take adderall or ritalin ?

## 2021-06-14 NOTE — Telephone Encounter (Signed)
Referrals for ADD/ADHD psychological testing sent to [facility] [place] [facility]. On rooming pt she was hypertensive w/o any symptoms. Vitals taken bi laterally in arms with about 7 minutes between readings with minimal change. Pt admits to not taken antihypertensive today due to medication being a diuretic and pt having several appointments today. Pt states that she will take medication as soon as she gets home/going straight home. Dr. Adele Schilder aware.  ?

## 2021-06-15 ENCOUNTER — Other Ambulatory Visit: Payer: Self-pay | Admitting: Internal Medicine

## 2021-06-15 DIAGNOSIS — E785 Hyperlipidemia, unspecified: Secondary | ICD-10-CM

## 2021-06-15 NOTE — Telephone Encounter (Signed)
Spoke with patient and gave her Dr. Rosezella [place] response. ?"Strattera can cause increased BP and HR but I do not see that these are an absolute contraindication.  Her pharmacist and prescribing physician would need to run these versus her entire medication list to make sure there is no interaction.  However, from a cardiovascular standpoint as long as we watch the heart rate and blood pressure she would have no absolute contraindication if these are medically necessary." ?Recommended that patient begin to monitor BP and P once a day and to call clinic next week to give [place] that information. She said she would. ?

## 2021-06-15 NOTE — Telephone Encounter (Signed)
Minus Breeding, MD  ?Sent: Fri June 15, 2021  8:14 AM  ?To: Loren Racer, RN  ? ? ?  ?   ? ? ?Strattera can cause increased BP and HR but I do not see that these are an absolute contraindication.  Her pharmacist and prescribing physician would need to run these versus her entire medication list to make sure there is no interaction.  However, from a cardiovascular standpoint as long as we watch the heart rate and blood pressure she would have no absolute contraindication if these are medically necessary.  ? ?

## 2021-06-20 ENCOUNTER — Telehealth: Payer: Medicare Other

## 2021-06-24 DIAGNOSIS — F332 Major depressive disorder, recurrent severe without psychotic features: Secondary | ICD-10-CM

## 2021-06-24 NOTE — Progress Notes (Signed)
?  ?Cardiology Office Note ? ? ?Date:  06/25/2021  ? ?ID:  [name], DOB [birth year], MRN [id number] ? ?PCP:  Samantha Lima, MD  ?Cardiologist:   Samantha Breeding, MD ? ? ? ?Chief Complaint  ?Patient presents with  ? Cardiomyopathy  ? ?  ?History of Present Illness: ?[name] is a 56 y.o. female who initially seen by me in 2016 for initial sleep clinic evaluation after initiation of CPAP therapy for obstructive sleep apnea. She had cath 04/11/16. This revealed normal coronaries, EF 25-30%, mild pulmonary HTN, and elevated LVEDP.  She was in the hospital in August 2018.  She had acute on chronic systolic and diastolic HF. She presents for follow up.   EF 12/2017 was 30 - 35%.  Her most recent echo demonstrated the EF to be 50 - 55% in Nov.    She called recently.  Unfortunately she is had severe problems with depression.  She has not been leaving her home.  She has not been taking her medications.  She has had weight gain and increased dyspnea.  We left her stone instructions recently on how to restart her medications since she been off them for a while.  She is currently taking them as listed below.  We did follow-up with some blood work recently and her thyroid electrolytes and CBC were essentially normal.  She presents for office follow-up.  She had become depressed and discontinued her meds but we restarted them and have titrated them upward. ? ?Since I last saw her she saw Almyra Deforest [facility] and she had was not on many of her meds because of her issues with depression.  She had her meds clarified.   Since then she has had no new cardiovascular complaints and she has been working to take her medications.  She is severely depressed and convinced that she has untreated ADHD.  I did answer question regarding her medications and said that she would be able to take ADHD meds as needed.  If she takes something like Strattera that raises her blood pressure she would need to have this followed.  Her blood pressures been  well controlled when she takes her medicines however. ? ? ?Past Medical History:  ?Diagnosis Date  ? ADD (attention deficit disorder)   ? Anxiety   ? Arthritis   ? both knees right worse than left  ? Carpal tunnel syndrome of right wrist   ? Depression   ? Essential hypertension, benign   ? Gallstones   ? History of smoking 06/22/2016  ? Hyperlipidemia associated with type 2 diabetes mellitus ([place]), on Zocor 04/04/2011  ? Hypertension associated with diabetes ([place]) 06/03/2008  ? Migraines   ? Morbid obesity ([place])   ? Morbid obesity with BMI of 50.0-59.9, adult ([place]) 07/12/2006  ? NICM (nonischemic cardiomyopathy) ([place]) 07/12/2006  ? 01/16/18 ECHO:    - Procedure narrative: Transthoracic echocardiography. Image   quality was suboptimal. The study was technically difficult.   Intravenous contrast (Definity) was administered. - Left ventricle: The cavity size was moderately dilated. Wall   thickness was increased in a pattern of mild LVH. Systolic   function was moderately to severely reduced. The estimated   ejection fraction w  ? Normal coronary arteries 04/17/2016  ? OSA on CPAP 10/28/2014  ? Spinal stenosis   ? back pain  ? Spondylosis without myelopathy or radiculopathy, lumbar region 12/04/2017  ? Type 2 diabetes mellitus with hyperglycemia (HCC)   ? ? ?Past Surgical History:  ?  Procedure Laterality Date  ? CHOLECYSTECTOMY    ? DILATATION & CURETTAGE/HYSTEROSCOPY WITH MYOSURE N/A 10/31/2017  ? Procedure: DILATATION & CURETTAGE/HYSTEROSCOPY WITH MYOSURE;  Surgeon: Salvadore Dom, MD;  Location: [facility];  Service: Gynecology;  Laterality: N/A;  ? RIGHT/LEFT HEART CATH AND CORONARY ANGIOGRAPHY N/A 04/11/2016  ? Procedure: Right/Left Heart Cath and Coronary Angiography;  Surgeon: Burnell Blanks, MD;  Location: [facility];  Service: Cardiovascular;  Laterality: N/A;  ? sonogram for blood clots    ? no blockages  ? ? ? ?Current Outpatient Medications  ?Medication Sig Dispense Refill  ? atomoxetine (STRATTERA) 18  MG capsule Take 1 capsule (18 mg total) by mouth daily. 30 capsule 0  ? carvedilol (COREG) 12.5 MG tablet Take 1 tablet (12.5 mg total) by mouth 2 (two) times daily with a meal. 180 tablet 3  ? Dapagliflozin-metFORMIN HCl ER (XIGDUO XR) 11-998 MG TB24 Take 1 tablet by mouth daily. 90 tablet 1  ? fluticasone (FLONASE) 50 MCG/ACT nasal spray Place into both nostrils.    ? furosemide (LASIX) 80 MG tablet Take 1 tablet (80 mg total) by mouth daily. 90 tablet 3  ? LINZESS 145 MCG CAPS capsule TAKE 1 CAPSULE BY MOUTH DAILY BEFORE BREAKFAST. 90 capsule 1  ? LORazepam (ATIVAN) 0.5 MG tablet Take 1 tablet (0.5 mg total) by mouth daily as needed for anxiety. 20 tablet 0  ? nystatin (MYCOSTATIN/NYSTOP) powder Apply 1 application topically 3 (three) times daily. 120 g 3  ? potassium chloride SA (KLOR-CON M20) 20 MEQ tablet Take 2 tablets (40 mEq total) by mouth daily. 180 tablet 3  ? sacubitril-valsartan (ENTRESTO) 97-103 MG Take 1 tablet by mouth 2 (two) times daily. 180 tablet 3  ? simvastatin (ZOCOR) 20 MG tablet TAKE 1 TABLET BY MOUTH EVERY DAY 90 tablet 1  ? vortioxetine HBr (TRINTELLIX) 20 MG TABS tablet Take one tablet by mouth (20 mg total) daily. 30 tablet 1  ? ?No current facility-administered medications for this visit.  ? ? ?Allergies:   Fluoxetine, Cymbalta [duloxetine hcl], and Penicillins  ? ? ?ROS:  Please see the history of present illness.   Otherwise, review of systems are positive for none.   All other systems are reviewed and negative.  ? ? ?PHYSICAL EXAM: ?VS:  BP 126/76   Pulse 80   Ht '5\' 4"'$  (1.626 m)   Wt (!) 314 lb (142.4 kg)   SpO2 96%   BMI 53.90 kg/m?  , BMI Body mass index is 53.9 kg/m?. ?GENERAL:  Well appearing ?NECK:  No jugular venous distention, waveform within normal limits, carotid upstroke brisk and symmetric, no bruits, no thyromegaly ?LUNGS:  Clear to auscultation bilaterally ?CHEST:  Unremarkable ?HEART:  PMI not displaced or sustained,S1 and S2 within normal limits, no S3, no S4,  no clicks, no rubs, no murmurs ?ABD:  Flat, positive bowel sounds normal in frequency in pitch, no bruits, no rebound, no guarding, no midline pulsatile mass, no hepatomegaly, no splenomegaly ?EXT:  2 plus pulses throughout, no edema, no cyanosis no clubbing ? ? ?EKG:  EKG is  not ordered today. ?NA ? ? ?Recent Labs: ?01/08/2021: Hemoglobin 15.9; Platelets 275 ?03/01/2021: ALT 15 ?05/21/2021: BUN 20; Creatinine, Ser 0.72; Potassium 4.0; Sodium 145  ? ? ?Lipid Panel ?   ?Component Value Date/Time  ? CHOL 176 03/01/2021 1530  ? TRIG 110.0 03/01/2021 1530  ? HDL 67.60 03/01/2021 1530  ? CHOLHDL 3 03/01/2021 1530  ? VLDL 22.0 03/01/2021 1530  ?  [place] 86 03/01/2021 1530  ? LDLDIRECT 143.8 04/03/2011 0929  ? ?  ? ?Wt Readings from Last 3 Encounters:  ?06/25/21 (!) 314 lb (142.4 kg)  ?06/14/21 (!) 328 lb 12.8 oz (149.1 kg)  ?05/21/21 (!) 321 lb 3.2 oz (145.7 kg)  ?  ? ? ?Other studies Reviewed: ?Additional studies/ records that were reviewed today include: Psychiatry notes, labs .    ?Review of the above records demonstrates:    See elsewhere  ? ? ?ASSESSMENT AND PLAN: ? ?DYSPNEA -  ?This is much improved.  I will be checking an echo as below.  ? ?CHRONIC SYSTOLIC HF -  ?She seems to be doing well on the meds as listed.  I will follow-up with an echocardiogram.  ? ?ESSENTIAL HYPERTENSION, BENIGN -  ?The blood pressure is well controlled.  No change in therapy.  ? ?SLEEP APNEA - ?She is using her CPAP.  ? ?DEPRESSION - ?We had a long conversation about this.  Again I would not think that her cardiac issue would preclude necessary therapy for depression or if she is diagnosed with ADHD I think she could take a stimulant.  ? ?DM -  ?Per Samantha Lima, MD her last A1c 6.9.  She will continue the meds as listed.  ? ? ?Current medicines are reviewed at length with the patient today.  The patient does not have concerns regarding medicines. ? ?The following changes have been made:  None ? ?Labs/ tests ordered today include:   ? ?Orders Placed This Encounter  ?Procedures  ? ECHOCARDIOGRAM COMPLETE  ? ? ? ? ?Disposition:   FU with APP in 6 months.  ? ? ?Signed, ?Samantha Breeding, MD  ?06/25/2021 1:05 PM    ?[place]

## 2021-06-25 ENCOUNTER — Ambulatory Visit (INDEPENDENT_AMBULATORY_CARE_PROVIDER_SITE_OTHER): Payer: Medicare Other | Admitting: Cardiology

## 2021-06-25 ENCOUNTER — Encounter: Payer: Self-pay | Admitting: Cardiology

## 2021-06-25 VITALS — BP 126/76 | HR 80 | Ht 64.0 in | Wt 314.0 lb

## 2021-06-25 DIAGNOSIS — I5022 Chronic systolic (congestive) heart failure: Secondary | ICD-10-CM

## 2021-06-25 DIAGNOSIS — R0602 Shortness of breath: Secondary | ICD-10-CM

## 2021-06-25 DIAGNOSIS — F32A Depression, unspecified: Secondary | ICD-10-CM

## 2021-06-25 DIAGNOSIS — G4733 Obstructive sleep apnea (adult) (pediatric): Secondary | ICD-10-CM

## 2021-06-25 DIAGNOSIS — I10 Essential (primary) hypertension: Secondary | ICD-10-CM | POA: Diagnosis not present

## 2021-06-25 NOTE — Patient Instructions (Signed)
Medication Instructions:  ? ?OTC- BENFOTIAMINE ? ?*If you need a refill on your cardiac medications before your next appointment, please call your pharmacy* ? ? ?Testing/Procedures: ? ?Your physician has requested that you have an echocardiogram. Echocardiography is a painless test that uses sound waves to create images of your heart. It provides your doctor with information about the size and shape of your heart and how well your heart?s chambers and valves are working. This procedure takes approximately one hour. There are no restrictions for this procedure. [place]Follow-Up: ?At [facility], you and your health needs are our priority.  As part of our continuing mission to provide you with exceptional heart care, we have created designated Provider Care Teams.  These Care Teams include your primary Cardiologist (physician) and Advanced Practice Providers (APPs -  Physician Assistants and Nurse Practitioners) who all work together to provide you with the care you need, when you need it. ? ?We recommend signing up for the patient portal called "MyChart".  Sign up information is provided on this After Visit Summary.  MyChart is used to connect with patients for Virtual Visits (Telemedicine).  Patients are able to view lab/test results, encounter notes, upcoming appointments, etc.  Non-urgent messages can be sent to your provider as well.   ?To learn more about what you can do with MyChart, go to [email or web address].   ? ?Your next appointment:   ?6 month(s) ? ?The format for your next appointment:   ?In Person ? ?Provider:   ?HAO MENG PA-C  ? ? ?Important Information About [place] ? ? ? ? ?  ?

## 2021-06-29 ENCOUNTER — Ambulatory Visit (INDEPENDENT_AMBULATORY_CARE_PROVIDER_SITE_OTHER): Payer: Medicare Other | Admitting: *Deleted

## 2021-06-29 DIAGNOSIS — I5022 Chronic systolic (congestive) heart failure: Secondary | ICD-10-CM

## 2021-06-29 DIAGNOSIS — E119 Type 2 diabetes mellitus without complications: Secondary | ICD-10-CM

## 2021-06-29 NOTE — Chronic Care Management (AMB) (Signed)
?Chronic Care Management  ? ?CCM RN Visit Note ? ?06/29/2021 ?Name: [name] MRN: [id number] DOB: 12-19-1965 ? ?Subjective: ?[name] is a 56 y.o. year old female who is a primary care patient of [name], MD. The care management team was consulted for assistance with disease management and care coordination needs.   ? ?Engaged with patient by telephone for follow up visit in response to provider referral for case management and/or care coordination services.  ? ?Consent to Services:  ?The patient was given information about Chronic Care Management services, agreed to services, and gave verbal consent prior to initiation of services.  Please see initial visit note for detailed documentation.  ?Patient agreed to services and verbal consent obtained.  ? ?Assessment: Review of patient past medical history, allergies, medications, health status, including review of consultants reports, laboratory and other test data, was performed as part of comprehensive evaluation and provision of chronic care management services.  ?CCM Care Plan ? ?Allergies  ?Allergen Reactions  ? Fluoxetine Other (See Comments)  ?  More depressed  ? Cymbalta [Duloxetine Hcl] Other (See Comments)  ?  depressed  ? Penicillins   ?  REACTION: rash ?Has patient had a PCN reaction causing immediate rash, facial/tongue/throat swelling, SOB or lightheadedness with hypotension: YES ?Has patient had a PCN reaction causing severe rash involving mucus membranes or skin necrosis: NO ?Has patient had a PCN reaction that required hospitalization: YES ?Has patient had a PCN reaction occurring within the last 10 years: NO ?If all of the above answers are "NO", then may proceed with Cephalosporin use. ?  ? ?Outpatient Encounter Medications as of 06/29/2021  ?Medication Sig Note  ? atomoxetine (STRATTERA) 18 MG capsule Take 1 capsule (18 mg total) by mouth daily.   ? carvedilol (COREG) 12.5 MG tablet Take 1 tablet (12.5 mg total) by mouth 2 (two) times daily  with a meal.   ? Dapagliflozin-metFORMIN HCl ER (XIGDUO XR) 11-998 MG TB24 Take 1 tablet by mouth daily.   ? fluticasone (FLONASE) 50 MCG/ACT nasal spray Place into both nostrils. 06/25/2021: As needed.  ? furosemide (LASIX) 80 MG tablet Take 1 tablet (80 mg total) by mouth daily.   ? LINZESS 145 MCG CAPS capsule TAKE 1 CAPSULE BY MOUTH DAILY BEFORE BREAKFAST.   ? LORazepam (ATIVAN) 0.5 MG tablet Take 1 tablet (0.5 mg total) by mouth daily as needed for anxiety.   ? nystatin (MYCOSTATIN/NYSTOP) powder Apply 1 application topically 3 (three) times daily.   ? potassium chloride SA (KLOR-CON M20) 20 MEQ tablet Take 2 tablets (40 mEq total) by mouth daily.   ? sacubitril-valsartan (ENTRESTO) 97-103 MG Take 1 tablet by mouth 2 (two) times daily.   ? simvastatin (ZOCOR) 20 MG tablet TAKE 1 TABLET BY MOUTH EVERY DAY   ? vortioxetine HBr (TRINTELLIX) 20 MG TABS tablet Take one tablet by mouth (20 mg total) daily.   ? ?No facility-administered encounter medications on file as of 06/29/2021.  ? ?Patient Active Problem List  ? Diagnosis Date Noted  ? Tinea corporis 03/14/2021  ? Hyperlipidemia LDL goal <100 03/01/2021  ? DDD (degenerative disc disease), lumbar 08/08/2020  ? Non-seasonal allergic rhinitis due to pollen 07/25/2020  ? Chronic idiopathic constipation 07/25/2020  ? Visit for screening mammogram 02/11/2020  ? Chronic bilateral low back pain without sciatica 02/10/2020  ? Spondylosis without myelopathy or radiculopathy, lumbar region 12/04/2017  ? Type 2 diabetes mellitus with hyperglycemia (HCC)   ? Normal coronary arteries 04/17/2016  ? OSA  on CPAP 10/28/2014  ? Major depressive disorder 10/28/2014  ? Chronic systolic CHF (congestive heart failure), NYHA class 3 ([place]) 12/06/2013  ? Hyperlipidemia associated with type 2 diabetes mellitus ([place]), on Zocor 04/04/2011  ? Abnormal electrocardiography 01/16/2009  ? Hypertension associated with diabetes ([place]) 06/03/2008  ? Morbid obesity with BMI of 50.0-59.9, adult ([place])  07/12/2006  ? NICM (nonischemic cardiomyopathy) ([place]) 07/12/2006  ? Attention deficit disorder 07/12/2006  ? Migraine without status migrainosus, not intractable 07/12/2006  ? ?Conditions to be addressed/monitored:  CHF and DMII ? ?Care Plan : RN Care Manager Plan of Care  ?Updates made by Knox Royalty, RN since 06/29/2021 12:00 AM  ?  ? ?Problem: Chronic Disease Management Needs   ?Priority: High  ?  ? ?Long-Range Goal: Ongoing adherence to established Plan of Care for long term chronic disease management   ?Start Date: 03/13/2021  ?Expected End Date: 03/13/2022  ?Priority: High  ?Note:   ?Current Barriers:  ?Chronic Disease Management support and education needs related to CHF, DMII, and severe depression ?Chronic back pain: reports "9/10" chronic back on daily basis: states ongoing pain prevents her from going upstairs to sleep in bedroom, she is currently sleeping downstairs; reports chronic pain makes her unable to do iADL's and her husband is essentially her caregiver for both ADL's and iADL's; she declines referral for pain management provider; states she rarely takes medications for pain; patient also reports that in the past PCP has referred her to outpatient rehabilitation/ PT, however, she never followed up because she is "so immobilized by depression and pain" that she could "never bring herself" to call and schedule with rehabilitation team-- she is currently not interested in another outpatient rehabilitation referral ?Ongoing depression- reports long-standing history depression in her family and herself; reports she had psychiatric provider that she "liked" very much, however, she did not feel that she was making progress in therapy with previous psychiatric provider: states she did not believe the medications provider prescribed was helping, and "all he wanted to do was to increase the doses;" today, 03/13/21- she confirms that she is not under psychiatric care and that she is NOT taking prescribed  medications for depression ?04/17/21: Patient confirms she had initial visit with psychiatric provider; today she tells me that her follow up in-person visit with psychiatric provider was cancelled by the provider, and that the office team is unable to re-schedule a timely appointment; she is very frustrated by this and feels she needs to be seen in person by provider as soon as possible so that she can be placed on the "right" antidepressant -- she agrees today to continue her efforts to follow up to ensure that this appointment is re- scheduled ?05/16/21: confirms she attended in-person office visit with psychiatric provider 05/03/21; confirms she will have ongoing follow up with provider- this was encouraged ?06/29/21: states she had "a horrible" visit with psychiatric provider "last week;" she tells me that "he doesn't give a shit about his patient's - you all should not send any more patient's to him; He does not listen to his patient's and he does not care;" patient is very animated and adamant with her comments; states she does not believe she "can ever go back to see him again;" she acknowledges that she has maintained communication with CCM CSW and I encouraged her to discuss with CCM CSW-- she will not discuss details of this visit with me further today, stated, "it just gets my blood pressure up when I thinks  about it;" I will copy today's note to CCM CSW as an FYI ?Self- acknowledges lack of motivation in adhering to plan of care around state of health: she attributes this to both her ongoing severe depression and her chronic back pain ?Need for resources for affordable dental services and for food acquisition for FRESH food only: has tried other food pantries, but "they are all unhealthy food choices/ canned" food, which patient is not interested in: [facility] referral placed ?04/17/21: confirmed [employer] reached out to patient several times, without success: provided  patient with number for care guide should she wish to engage with them in the future to obtain resources ?05/16/21: confirmed patient spoke with [employer] care guide 04/26/21; confirms she received prin

## 2021-06-29 NOTE — Patient Instructions (Signed)
Visit Information ? ?Saavi, thank you for taking time to talk with me today. Please don't hesitate to contact me if I can be of assistance to you before our next scheduled telephone appointment ? ?Below are the goals we discussed today:  ?Patient Self-Care Activities: ?Patient [place] will: ?Take medications as prescribed ?Attend all scheduled provider appointments ?Call pharmacy for medication refills ?Call provider office for new concerns or questions ?Continue your efforts to follow heart healthy, low salt, low cholesterol, carbohydrate-modified, low sugar diet ?Keep up the great work preventing falls- continue using your walker ?Please stay in touch with the Clinical Social Worker for assistance with management of your depression-- please talk to Hilda Blades about your concerns with your current psychiatric provider  ? ?Our next scheduled telephone follow up visit/ appointment is scheduled on: Wednesday, September 26, 2021 at 3:00 pm- This is a PHONE CALL appointment ? ?If you need to cancel or re-schedule our visit, please call [phone] and our care guide team will be happy to assist you. ?  ?I look forward to hearing about your progress. ?  ?Oneta Rack, RN, BSN, CCRN Alumnus ?[place] ?[phone]: direct office ? ?If you are experiencing a Mental Health or [place] or need someone to talk to, please  ?call the Suicide and Crisis Lifeline: 988 ?call the [place] National Suicide Prevention Lifeline: 281 655 3188 or TTY: 917 599 9534 TTY (629)876-7250) to talk to a trained counselor ?call 1-800-273-TALK (toll free, 24 hour hotline) ?go to [facility] [address] [place] [phone]) ?call 911  ? ?The patient verbalized understanding of instructions, educational materials, and care plan provided today and agreed to receive a mailed copy of patient instructions, educational materials, and care  plan ? ?Echocardiogram ?An echocardiogram is a test that uses sound waves (ultrasound) to produce images of the heart. ?Images from an echocardiogram can provide important information about: ?Heart size and shape. ?The size and thickness and movement of your heart's walls. ?Heart muscle function and strength. ?Heart valve function or if you have stenosis. Stenosis is when the heart valves are too narrow. ?If blood is flowing backward through the heart valves (regurgitation). ?A tumor or infectious growth around the heart valves. ?Areas of heart muscle that are not working well because of poor blood flow or injury from a heart attack. ?Aneurysm detection. An aneurysm is a weak or damaged part of an artery wall. The wall bulges out from the normal force of blood pumping through the body. ?Tell a health care provider about: ?Any allergies you have. ?All medicines you are taking, including vitamins, herbs, eye drops, creams, and over-the-counter medicines. ?Any blood disorders you have. ?Any surgeries you have had. ?Any medical conditions you have. ?Whether you are pregnant or may be pregnant. ?What are the risks? ?Generally, this is a safe test. However, problems may occur, including an allergic reaction to dye (contrast) that may be used during the test. ?What happens before the test? ?No specific preparation is needed. You may eat and drink normally. ?What happens during the test? ? ?You will take off your clothes from the waist up and put on a hospital gown. ?Electrodes or electrocardiogram (ECG)patches may be placed on your chest. The electrodes or patches are then connected to a device that monitors your heart rate and rhythm. ?You will lie down on a table for an ultrasound exam. A gel will be applied to your chest to help sound waves pass through  your skin. ?A handheld device, called a transducer, will be pressed against your chest and moved over your heart. The transducer produces sound waves that travel to your  heart and bounce back (or "echo" back) to the transducer. These sound waves will be captured in real-time and changed into images of your heart that can be viewed on a video monitor. The images will be recorded on a computer and reviewed by your health care provider. ?You may be asked to change positions or hold your breath for a short time. This makes it easier to get different views or better views of your heart. ?In some cases, you may receive contrast through an IV in one of your veins. This can improve the quality of the pictures from your heart. ?The procedure may vary among health care providers and hospitals. ?What can I expect after the test? ?You may return to your normal, everyday life, including diet, activities, and medicines, unless your health care provider tells you not to do that. ?Follow these instructions at home: ?It is up to you to get the results of your test. Ask your health care provider, or the department that is doing the test, when your results will be ready. ?Keep all follow-up visits. This is important. ?Summary ?An echocardiogram is a test that uses sound waves (ultrasound) to produce images of the heart. ?Images from an echocardiogram can provide important information about the size and shape of your heart, heart muscle function, heart valve function, and other possible heart problems. ?You do not need to do anything to prepare before this test. You may eat and drink normally. ?After the echocardiogram is completed, you may return to your normal, everyday life, unless your health care provider tells you not to do that. ?This information is not intended to replace advice given to you by your health care provider. Make sure you discuss any questions you have with your health care provider. ?Document Revised: 10/25/2020 Document Reviewed: 10/05/2019 ?Elsevier Patient Education ? [place]. ?  ? ?

## 2021-07-02 ENCOUNTER — Ambulatory Visit: Payer: Medicare Other | Admitting: Licensed Clinical Social Worker

## 2021-07-02 ENCOUNTER — Encounter: Payer: Self-pay | Admitting: Licensed Clinical Social Worker

## 2021-07-02 DIAGNOSIS — F332 Major depressive disorder, recurrent severe without psychotic features: Secondary | ICD-10-CM

## 2021-07-02 DIAGNOSIS — F902 Attention-deficit hyperactivity disorder, combined type: Secondary | ICD-10-CM

## 2021-07-02 NOTE — Patient Instructions (Addendum)
Visit Information ? ?Thank you for taking time to visit with me today. Please don't hesitate to contact me if I can be of assistance to you before our next scheduled telephone appointment. ? ?Following are the goals we discussed today: Dental needs and Mental health ?Patient Self-Care Activities: ?call Dr. Carmelia Roller to schedule a dental appointment [phone] ?Keep appointment with psychiatry May 12th  ?   ?  ?[name], LCSW ?Licensed Clinical Social Worker [facility]  ?[place]  ?[phone]  ? ?Our next appointment is by telephone on May 22nd at 3:00 ? ?Please call the care guide team at [phone] if you need to cancel or reschedule your appointment.  ? ?If you are experiencing a Mental Health or [place] or need someone to talk to, please call the Suicide and Crisis Lifeline: 988 ?call the [place] National Suicide Prevention Lifeline: 6033851554 or TTY: (331) 363-8267 TTY [phone]) to talk to a trained counselor ?call 1-800-273-TALK (toll free, 24 hour hotline) ?go to [facility] [address], [place] [phone])  ? ?The patient verbalized understanding of instructions, educational materials, and care plan provided today and agreed to receive a mailed copy of patient instructions, educational materials, and care plan.  ? ? ?

## 2021-07-02 NOTE — Chronic Care Management (AMB) (Signed)
?Chronic Care Management  ? Clinical Social Work Note ? ?07/02/2021 ?Name: [name] MRN: [id number] DOB: [birth year] ? ?[name] is a 56 y.o. year old female who is a primary care patient of [name], MD. The CCM team was consulted to assist the patient with chronic disease management and/or care coordination needs related to: [employer]  and [place] and Resources.  ? ?Engaged with patient by telephone for follow up visit in response to provider referral for social work chronic care management and care coordination services.  ? ?Consent to Services:  ?The patient was given information about Chronic Care Management services, agreed to services, and gave verbal consent prior to initiation of services.  Please see initial visit note for detailed documentation.  ? ?Patient agreed to services and consent obtained.  ? ?Summary: Assessed patient's current treatment, progress, coping skills, support system and barriers to care.  ?She is making progress with mental health treatment thought she continues to get frustrated with navigating her care and treatment . Continues to experience other difficulties with getting dental needs met. LCSW called and only able to locate one provider taking new patients with Medicare and Medicaid .  See Care Plan below for interventions and patient self-care actives. ? ?Recommendation: Patient may benefit from, and is in agreement to keep appointments with psychiatry, and call contact information provided for dental options.  ? ?Follow up Plan: Patient would like continued follow-up from CCM LCSW.  per patient's request will follow up in 2 weeks.  Will call office if needed prior to next encounter. ?  ?Assessment: Review of patient past medical history, allergies, medications, and health status, including review of relevant consultants reports was performed today as part of a comprehensive evaluation and provision of chronic care management and care  coordination services.    ? ?SDOH (Social Determinants of Health) assessments and interventions performed:   ? ?Advanced Directives Status: Not addressed in this encounter. ? ?CCM Care Plan ?Conditions to be addressed/monitored: Anxiety and Depression;  dental needs ? ?Care Plan : LCSW Plan of Care  ?Updates made by Maurine Cane, LCSW since 07/02/2021 12:00 AM  ?  ? ?Problem: Symptoms of Depression not managed   ?  ? ?Goal: Symptoms Monitored and Managed with medication and therapy   ?Start Date: 03/20/2021  ?This Visit's Progress: On track  ?Recent Progress: On track  ?Priority: High  ?Note:   ?Current Barriers:  ?Disease Management support and education needs related to Depression: depressed mood ?loss of energy/fatigue ?difficulty concentrating ? ?CSW Clinical Goal(s):  ?Patient  will work with mental health provider and LCSW to address needs related to symptoms of depression  through collaboration with Clinical Social Worker, provider, and care team.  ?Patient will work with LCSW to assist coordination of mental health needs ? ?Interventions: ?1:1 collaboration with primary care provider regarding development and update of comprehensive plan of care as evidenced by provider attestation and co-signature ?Inter-disciplinary care team collaboration (see longitudinal plan of care) ?Evaluation of current treatment plan related to  self management and patient's adherence to plan as established by provider ? ?Mental Health:  (Status: Goal on Track (progressing): YES.) with connecting to mental health provider,  ?Evaluation of current treatment plan related to ADHD, Mood Instability, and symptoms of depression  ?Motivational Interviewing employed ?Solution-Focused Strategies employed:  ?Active listening / Reflection utilized  ?Emotional Support Provided ?Problem Solving /Task Center strategies reviewed ?Provided psychoeducation for mental health needs  ?Reviewed mental health medications and  discussed importance of  compliance: reports no missed dose however has not started taking Stratteral ?Called to located dentist for dental needs ?Collaborated with Dr. Adele Schilder, Psychiatrist per patient's request ? ?Patient Self-Care Activities: ?call Dr. Carmelia Roller to schedule a dental appointment [phone] ?Keep appointment with psychiatry May 12th  ?Utilize the information below if you have a mental health crisis ?[place], [place] ?[place] [phone]  Crisis [phone] ?  ?  ?[name], LCSW ?Licensed Clinical Social Worker [facility]  ?[place]  ?[phone]  ? ?

## 2021-07-06 ENCOUNTER — Other Ambulatory Visit (HOSPITAL_COMMUNITY): Payer: Self-pay | Admitting: Psychiatry

## 2021-07-06 ENCOUNTER — Telehealth (HOSPITAL_COMMUNITY): Payer: Medicare Other | Admitting: Psychiatry

## 2021-07-06 DIAGNOSIS — F988 Other specified behavioral and emotional disorders with onset usually occurring in childhood and adolescence: Secondary | ICD-10-CM

## 2021-07-10 ENCOUNTER — Ambulatory Visit (HOSPITAL_COMMUNITY): Payer: Medicare Other | Attending: Internal Medicine

## 2021-07-10 DIAGNOSIS — R0602 Shortness of breath: Secondary | ICD-10-CM | POA: Diagnosis not present

## 2021-07-10 LAB — ECHOCARDIOGRAM COMPLETE
Area-P 1/2: 2 cm2
S' Lateral: 5.2 cm

## 2021-07-13 ENCOUNTER — Telehealth: Payer: Self-pay | Admitting: *Deleted

## 2021-07-13 MED ORDER — CARVEDILOL 25 MG PO TABS
25.0000 mg | ORAL_TABLET | Freq: Two times a day (BID) | ORAL | 3 refills | Status: DC
Start: 2021-07-13 — End: 2022-06-10

## 2021-07-13 NOTE — Telephone Encounter (Signed)
-----   Message from Minus Breeding, MD sent at 07/11/2021  9:12 PM EDT ----- I would like to increase the Coreg to 25 mg BID.  The EF is a little lower than previous.  Call Ms. Thornell with the results and send results to [name], MD

## 2021-07-13 NOTE — Telephone Encounter (Signed)
pt aware of results  New script sent to the pharmacy  Results forwarded

## 2021-07-16 ENCOUNTER — Ambulatory Visit: Payer: Medicare Other | Admitting: Licensed Clinical Social Worker

## 2021-07-16 DIAGNOSIS — F332 Major depressive disorder, recurrent severe without psychotic features: Secondary | ICD-10-CM

## 2021-07-16 DIAGNOSIS — F902 Attention-deficit hyperactivity disorder, combined type: Secondary | ICD-10-CM

## 2021-07-16 NOTE — Patient Instructions (Signed)
     I am sorry you were unable to keep your phone appointment today.   The Care Guide will contact you to reschedule the phone appointment    [name], LCSW Licensed Clinical Social Worker [facility]  [place]  [phone]

## 2021-07-16 NOTE — Chronic Care Management (AMB) (Signed)
    Clinical Social Work  Care Management   Phone Outreach    07/16/2021 Name: Marcellene Shivley MRN: [id number] DOB: 10-07-1965  Dalanie Kisner is a 55 y.o. year old female who is a primary care patient of [name], MD .  Two calls made Reason for referral: Mental Health Counseling and Resources.    F/U phone call today to assess needs, progress and barriers with care plan goals.   Patient's husband answered phone reports patient is sleeping and unable to keep phone appointment today and requested to reschedule.  Plan:Will route chart to Care Guide to see if patient would like to reschedule phone appointment   Review of patient status, including review of consultants reports, relevant laboratory and other test results, and collaboration with appropriate care team members and the patient's provider was performed as part of comprehensive patient evaluation and provision of care management services.    [name], LCSW Licensed Clinical Social Worker [facility]  [place]  [phone]

## 2021-07-25 ENCOUNTER — Telehealth: Payer: Self-pay | Admitting: *Deleted

## 2021-07-25 DIAGNOSIS — F32A Depression, unspecified: Secondary | ICD-10-CM

## 2021-07-25 DIAGNOSIS — I509 Heart failure, unspecified: Secondary | ICD-10-CM | POA: Diagnosis not present

## 2021-07-25 DIAGNOSIS — Z87891 Personal history of nicotine dependence: Secondary | ICD-10-CM | POA: Diagnosis not present

## 2021-07-25 DIAGNOSIS — E1159 Type 2 diabetes mellitus with other circulatory complications: Secondary | ICD-10-CM | POA: Diagnosis not present

## 2021-07-25 NOTE — Chronic Care Management (AMB) (Signed)
  Chronic Care Management Note  07/25/2021 Name: [name] MRN: [id number] DOB: November 24, 1965  [name] is a 56 y.o. year old female who is a primary care patient of [name], MD and is actively engaged with the care management team. I reached out to [name] by phone today to assist with re-scheduling a follow up visit with the Licensed Clinical Social Worker  Follow up plan: Unsuccessful telephone outreach attempt made. A HIPAA compliant phone message was left for the patient providing contact information and requesting a return call.  The care management team will reach out to the patient again over the next 7 days.  If patient returns call to provider office, please advise to call [place] at [phone].  [facility]  Direct Dial: [phone]

## 2021-08-01 ENCOUNTER — Encounter: Payer: Self-pay | Admitting: Obstetrics and Gynecology

## 2021-08-01 ENCOUNTER — Ambulatory Visit (INDEPENDENT_AMBULATORY_CARE_PROVIDER_SITE_OTHER): Payer: Medicare Other | Admitting: Obstetrics and Gynecology

## 2021-08-01 ENCOUNTER — Other Ambulatory Visit (HOSPITAL_COMMUNITY)
Admission: RE | Admit: 2021-08-01 | Discharge: 2021-08-01 | Disposition: A | Discharge status: Home / Self Care | Payer: Medicare Other | Source: Ambulatory Visit | Attending: Obstetrics and Gynecology | Admitting: Obstetrics and Gynecology

## 2021-08-01 VITALS — BP 135/86 | HR 75 | Ht 64.0 in | Wt 307.0 lb

## 2021-08-01 DIAGNOSIS — Z01419 Encounter for gynecological examination (general) (routine) without abnormal findings: Secondary | ICD-10-CM

## 2021-08-01 DIAGNOSIS — Z1231 Encounter for screening mammogram for malignant neoplasm of breast: Secondary | ICD-10-CM

## 2021-08-01 DIAGNOSIS — N3941 Urge incontinence: Secondary | ICD-10-CM

## 2021-08-01 DIAGNOSIS — Z0001 Encounter for general adult medical examination with abnormal findings: Secondary | ICD-10-CM

## 2021-08-01 DIAGNOSIS — Z1151 Encounter for screening for human papillomavirus (HPV): Secondary | ICD-10-CM | POA: Diagnosis not present

## 2021-08-01 DIAGNOSIS — Z1272 Encounter for screening for malignant neoplasm of vagina: Secondary | ICD-10-CM

## 2021-08-01 DIAGNOSIS — Z01411 Encounter for gynecological examination (general) (routine) with abnormal findings: Secondary | ICD-10-CM

## 2021-08-01 DIAGNOSIS — F3341 Major depressive disorder, recurrent, in partial remission: Secondary | ICD-10-CM | POA: Diagnosis not present

## 2021-08-01 NOTE — Patient Instructions (Signed)

## 2021-08-01 NOTE — Progress Notes (Signed)
NGYN pt presents to establish care. Pt has Hx of depression and anxiety. Pt c/o having weak bladder. No other concerns at this time.

## 2021-08-01 NOTE — Progress Notes (Signed)
[name] is a 56 y.o. G36P1011 female here for a routine annual gynecologic exam.  Current complaints: Problems with major depression.   She also reports some urge incont Sx, feels related to her lasix use. Denies abnormal vaginal bleeding, discharge, pelvic pain, problems with intercourse or other gynecologic concerns.    Gynecologic History No LMP recorded. Patient is perimenopausal. Contraception: post menopausal status Last Pap: uncertain. Results were: normal Last mammogram: uncertain. Results were: normal  Obstetric History OB History  Gravida Para Term Preterm AB Living  '2 1 1 '$ 0 1 1  SAB IAB Ectopic Multiple Live Births  0 1 0 0 1    # Outcome Date GA Lbr Len/2nd Weight Sex Delivery Anes PTL Lv  2 Term 03/31/01    M Vag-Spont   LIV  1 IAB             Past Medical History:  Diagnosis Date   ADD (attention deficit disorder)    Anxiety    Arthritis    both knees right worse than left   Carpal tunnel syndrome of right wrist    Depression    Essential hypertension, benign    Gallstones    History of smoking 06/22/2016   Hyperlipidemia associated with type 2 diabetes mellitus ([place]), on Zocor 04/04/2011   Hypertension associated with diabetes ([place]) 06/03/2008   Migraines    Morbid obesity ([place])    Morbid obesity with BMI of 50.0-59.9, adult ([place]) 07/12/2006   NICM (nonischemic cardiomyopathy) ([place]) 07/12/2006   01/16/18 ECHO:    - Procedure narrative: Transthoracic echocardiography. Image   quality was suboptimal. The study was technically difficult.   Intravenous contrast (Definity) was administered. - Left ventricle: The cavity size was moderately dilated. Wall   thickness was increased in a pattern of mild LVH. Systolic   function was moderately to severely reduced. The estimated   ejection fraction w   Normal coronary arteries 04/17/2016   OSA on CPAP 10/28/2014   Spinal stenosis    back pain   Spondylosis without myelopathy or radiculopathy, lumbar region 12/04/2017   Type 2  diabetes mellitus with hyperglycemia [facility])     Past Surgical History:  Procedure Laterality Date   CHOLECYSTECTOMY     DILATATION & CURETTAGE/HYSTEROSCOPY WITH MYOSURE N/A 10/31/2017   Procedure: DILATATION & CURETTAGE/HYSTEROSCOPY WITH MYOSURE;  Surgeon: Salvadore Dom, MD;  Location: [facility];  Service: Gynecology;  Laterality: N/A;   RIGHT/LEFT HEART CATH AND CORONARY ANGIOGRAPHY N/A 04/11/2016   Procedure: Right/Left Heart Cath and Coronary Angiography;  Surgeon: Burnell Blanks, MD;  Location: [facility];  Service: Cardiovascular;  Laterality: N/A;   sonogram for blood clots     no blockages    Current Outpatient Medications on File Prior to Visit  Medication Sig Dispense Refill   atomoxetine (STRATTERA) 18 MG capsule Take 1 capsule (18 mg total) by mouth daily. 30 capsule 0   carvedilol (COREG) 25 MG tablet Take 1 tablet (25 mg total) by mouth 2 (two) times daily with a meal. 180 tablet 3   Dapagliflozin-metFORMIN HCl ER (XIGDUO XR) 11-998 MG TB24 Take 1 tablet by mouth daily. 90 tablet 1   fluticasone (FLONASE) 50 MCG/ACT nasal spray Place into both nostrils.     furosemide (LASIX) 80 MG tablet Take 1 tablet (80 mg total) by mouth daily. 90 tablet 3   LINZESS 145 MCG CAPS capsule TAKE 1 CAPSULE BY MOUTH DAILY BEFORE BREAKFAST. 90 capsule 1   LORazepam (ATIVAN) 0.5  MG tablet Take 1 tablet (0.5 mg total) by mouth daily as needed for anxiety. 20 tablet 0   nystatin (MYCOSTATIN/NYSTOP) powder Apply 1 application topically 3 (three) times daily. 120 g 3   potassium chloride SA (KLOR-CON M20) 20 MEQ tablet Take 2 tablets (40 mEq total) by mouth daily. 180 tablet 3   sacubitril-valsartan (ENTRESTO) 97-103 MG Take 1 tablet by mouth 2 (two) times daily. 180 tablet 3   simvastatin (ZOCOR) 20 MG tablet TAKE 1 TABLET BY MOUTH EVERY DAY 90 tablet 1   vortioxetine HBr (TRINTELLIX) 20 MG TABS tablet Take one tablet by mouth (20 mg total) daily. 30 tablet 1   No current  facility-administered medications on file prior to visit.    Allergies  Allergen Reactions   Fluoxetine Other (See Comments)    More depressed   Cymbalta [Duloxetine Hcl] Other (See Comments)    depressed   Penicillins     REACTION: rash Has patient had a PCN reaction causing immediate rash, facial/tongue/throat swelling, SOB or lightheadedness with hypotension: YES Has patient had a PCN reaction causing severe rash involving mucus membranes or skin necrosis: NO Has patient had a PCN reaction that required hospitalization: YES Has patient had a PCN reaction occurring within the last 10 years: NO If all of the above answers are "NO", then may proceed with Cephalosporin use.     Social History   Socioeconomic History   Marital status: Married    Spouse name: Not on file   Number of children: Not on file   Years of education: Not on file   Highest education level: Not on file  Occupational History   Not on file  Tobacco Use   Smoking status: Former    Packs/day: 0.50    Types: Cigarettes    Quit date: 02/25/1997    Years since quitting: 24.4   Smokeless tobacco: Never   Tobacco comments:    Married, lives with spouse (when he is not traveling) and son  Vaping Use   Vaping Use: Never used  Substance and Sexual Activity   Alcohol use: No   Drug use: No   Sexual activity: Yes    Partners: Male    Birth control/protection: None  Other Topics Concern   Not on file  Social History Narrative   Not on file   Social Determinants of Health   Financial Resource Strain: High Risk   Difficulty of Paying Living Expenses: Very hard  Food Insecurity: Food Insecurity Present   Worried About [place] in the Last Year: Sometimes true   Ran Out of Food in the Last Year: Sometimes true  Transportation Needs: No Transportation Needs   Lack of Transportation (Medical): No   Lack of Transportation (Non-Medical): No  Physical Activity: Inactive   Days of Exercise per Week:  0 days   Minutes of Exercise per Session: 0 min  Stress: Stress Concern Present   Feeling of Stress : Rather much  Social Connections: Moderately Isolated   Frequency of Communication with Friends and Family: More than three times a week   Frequency of Social Gatherings with Friends and Family: Once a week   Attends Religious Services: Never   Marine scientist or Organizations: No   Attends Music therapist: Never   Marital Status: Married  Human resources officer Violence: Not At Risk   Fear of Current or Ex-Partner: No   Emotionally Abused: No   Physically Abused: No   Sexually Abused:  No    Family History  Problem Relation Age of Onset   Depression Mother    Anxiety disorder Mother    Diabetes Mother    Hypertension Mother    ADD / ADHD Father    Diabetes Father    Hypertension Father    Colitis Father    Hypertension Other    Diabetes Other    Colitis Other    Alcohol abuse Other     The following portions of the patient's history were reviewed and updated as appropriate: allergies, current medications, past family history, past medical history, past social history, past surgical history and problem list.  Review of Systems Pertinent items noted in HPI and remainder of comprehensive ROS otherwise negative.   Objective:  BP 135/86   Pulse 75   Ht '5\' 4"'$  (1.626 m)   Wt (!) 307 lb (139.3 kg)   BMI 52.70 kg/m  CONSTITUTIONAL: Well-developed, well-nourished female in no acute distress.  HENT:  Normocephalic, atraumatic, External right and left ear normal. Oropharynx is clear and moist EYES: Conjunctivae and EOM are normal. Pupils are equal, round, and reactive to light. No scleral icterus.  NECK: Normal range of motion, supple, no masses.  Normal thyroid.  SKIN: Skin is warm and dry. No rash noted. Not diaphoretic. No erythema. No pallor. [place]: Alert and oriented to person, place, and time. Normal reflexes, muscle tone coordination. No cranial nerve  deficit noted. PSYCHIATRIC: Normal mood and affect. Normal behavior. Normal judgment and thought content. CARDIOVASCULAR: Normal heart rate noted, regular rhythm RESPIRATORY: Clear to auscultation bilaterally. Effort and breath sounds normal, no problems with respiration noted. BREASTS: Symmetric in size. No masses, skin changes, nipple drainage, or lymphadenopathy. ABDOMEN: Soft, normal bowel sounds, no distention noted.  No tenderness, rebound or guarding.  PELVIC: Normal appearing external genitalia; normal appearing vaginal mucosa and cervix.  No abnormal discharge noted.  Pap smear obtained.  Normal uterine size, no other palpable masses, no uterine or adnexal tenderness. Bimanula exam limited by pt habitus MUSCULOSKELETAL: Normal range of motion. No tenderness.  No cyanosis, clubbing, or edema.  2+ distal pulses.   Assessment:  Annual gynecologic examination with pap smear Screening Mammogram Depression Urger incont Plan:  Will follow up results of pap smear and manage accordingly. Mammogram scheduled. Declines treatment for her urge incont. [place] referral made Routine preventative health maintenance measures emphasized. Please refer to After Visit Summary for other counseling recommendations.    Chancy Milroy, MD, [place] Attending [place] for [facility], [place]

## 2021-08-02 ENCOUNTER — Other Ambulatory Visit (HOSPITAL_COMMUNITY): Payer: Self-pay | Admitting: Psychiatry

## 2021-08-02 DIAGNOSIS — F988 Other specified behavioral and emotional disorders with onset usually occurring in childhood and adolescence: Secondary | ICD-10-CM

## 2021-08-03 ENCOUNTER — Telehealth: Payer: Self-pay

## 2021-08-03 LAB — CYTOLOGY - PAP
Adequacy: ABSENT
Comment: NEGATIVE
Diagnosis: NEGATIVE
High risk HPV: NEGATIVE

## 2021-08-03 NOTE — Telephone Encounter (Signed)
Pt is calling to requesting a new referral to a new therapists. Pt was seeing a Therapist and had a great rapport but wasn't getting better. Pt was then seen by another person in the same office but that wasn't a good fit.   Now pt is requesting to see  Dr. Norma Fredrickson [address], [place], [place] [address] [phone]

## 2021-08-06 NOTE — Addendum Note (Signed)
Addended by: Tarry Kos on: 08/06/2021 12:00 PM   Modules accepted: Orders

## 2021-08-06 NOTE — Chronic Care Management (AMB) (Signed)
  Chronic Care Management Note  08/06/2021 Name: [name] MRN: [id number] DOB: [birth year]  [name] is a 56 y.o. year old female who is a primary care patient of [name], MD and is actively engaged with the care management team. I reached out to [name] by phone today to assist with re-scheduling a follow up visit with the Licensed Clinical Social Worker  Follow up plan: Unsuccessful telephone outreach attempt made. A HIPAA compliant phone message was left for the patient providing contact information and requesting a return call.  The care management team will reach out to the patient again over the next 7 days.  If patient returns call to provider office, please advise to call [place] at [phone].  [place] Management  Direct Dial: [phone]

## 2021-08-08 ENCOUNTER — Ambulatory Visit (INDEPENDENT_AMBULATORY_CARE_PROVIDER_SITE_OTHER): Payer: Medicare Other | Admitting: *Deleted

## 2021-08-08 DIAGNOSIS — Z0000 Encounter for general adult medical examination without abnormal findings: Secondary | ICD-10-CM | POA: Diagnosis not present

## 2021-08-08 NOTE — Patient Instructions (Signed)
Ms. [name] , Thank you for taking time to come for your Medicare Wellness Visit. I appreciate your ongoing commitment to your health goals. Please review the following plan we discussed and let me know if I can assist you in the future.   Screening recommendations/referrals: Colonoscopy: Education provided Mammogram: Education provided  Recommended yearly ophthalmology/optometry visit for glaucoma screening and checkup Recommended yearly dental visit for hygiene and checkup  Vaccinations: Influenza vaccine: up to date  Pneumococcal vaccine: up to date Tdap vaccine: up to date Shingles vaccine: Education provided     Next appointment: 08-09-2021 @ 11:20   Preventive Care 40-64 Years and Older, Female Preventive care refers to lifestyle choices and visits with your health care provider that can promote health and wellness. What does preventive care include? A yearly physical exam. This is also called an annual well check. Dental exams once or twice a year. Routine eye exams. Ask your health care provider how often you should have your eyes checked. Personal lifestyle choices, including: Daily care of your teeth and gums. Regular physical activity. Eating a healthy diet. Avoiding tobacco and drug use. Limiting alcohol use. Practicing safe sex. Taking low-dose aspirin every day. Taking vitamin and mineral supplements as recommended by your health care provider. What happens during an annual well check? The services and screenings done by your health care provider during your annual well check will depend on your age, overall health, lifestyle risk factors, and family history of disease. Counseling  Your health care provider may ask you questions about your: Alcohol use. Tobacco use. Drug use. Emotional well-being. Home and relationship well-being. Sexual activity. Eating habits. History of falls. Memory and ability to understand (cognition). Work and work  Statistician. Reproductive health. Screening  You may have the following tests or measurements: Height, weight, and BMI. Blood pressure. Lipid and cholesterol levels. These may be checked every 5 years, or more frequently if you are over 80 years old. Skin check. Lung cancer screening. You may have this screening every year starting at age 65 if you have a 30-pack-year history of smoking and currently smoke or have quit within the past 15 years. Fecal occult blood test (FOBT) of the stool. You may have this test every year starting at age 6. Flexible sigmoidoscopy or colonoscopy. You may have a sigmoidoscopy every 5 years or a colonoscopy every 10 years starting at age 52. Hepatitis C blood test. Hepatitis B blood test. Sexually transmitted disease (STD) testing. Diabetes screening. This is done by checking your blood sugar (glucose) after you have not eaten for a while (fasting). You may have this done every 1-3 years. Bone density scan. This is done to screen for osteoporosis. You may have this done starting at age 7. Mammogram. This may be done every 1-2 years. Talk to your health care provider about how often you should have regular mammograms. Talk with your health care provider about your test results, treatment options, and if necessary, the need for more tests. Vaccines  Your health care provider may recommend certain vaccines, such as: Influenza vaccine. This is recommended every year. Tetanus, diphtheria, and acellular pertussis (Tdap, Td) vaccine. You may need a Td booster every 10 years. Zoster vaccine. You may need this after age 82. Pneumococcal 13-valent conjugate (PCV13) vaccine. One dose is recommended after age 59. Pneumococcal polysaccharide (PPSV23) vaccine. One dose is recommended after age 62. Talk to your health care provider about which screenings and vaccines you need and how often you need them. This  information is not intended to replace advice given to you by  your health care provider. Make sure you discuss any questions you have with your health care provider. Document Released: 03/10/2015 Document Revised: 11/01/2015 Document Reviewed: 12/13/2014 Elsevier Interactive Patient Education  2017 [place] Prevention in the Home Falls can cause injuries. They can happen to people of all ages. There are many things you can do to make your home safe and to help prevent falls. What can I do on the outside of my home? Regularly fix the edges of walkways and driveways and fix any cracks. Remove anything that might make you trip as you walk through a door, such as a raised step or threshold. Trim any bushes or trees on the path to your home. Use bright outdoor lighting. Clear any walking paths of anything that might make someone trip, such as rocks or tools. Regularly check to see if handrails are loose or broken. Make sure that both sides of any steps have handrails. Any raised decks and porches should have guardrails on the edges. Have any leaves, snow, or ice cleared regularly. Use sand or salt on walking paths during winter. Clean up any spills in your garage right away. This includes oil or grease spills. What can I do in the bathroom? Use night lights. Install grab bars by the toilet and in the tub and shower. Do not use towel bars as grab bars. Use non-skid mats or decals in the tub or shower. If you need to sit down in the shower, use a plastic, non-slip stool. Keep the floor dry. Clean up any water that spills on the floor as soon as it happens. Remove soap buildup in the tub or shower regularly. Attach bath mats securely with double-sided non-slip rug tape. Do not have throw rugs and other things on the floor that can make you trip. What can I do in the bedroom? Use night lights. Make sure that you have a light by your bed that is easy to reach. Do not use any sheets or blankets that are too big for your bed. They should not hang  down onto the floor. Have a firm chair that has side arms. You can use this for support while you get dressed. Do not have throw rugs and other things on the floor that can make you trip. What can I do in the kitchen? Clean up any spills right away. Avoid walking on wet floors. Keep items that you use a lot in easy-to-reach places. If you need to reach something above you, use a strong step stool that has a grab bar. Keep electrical cords out of the way. Do not use floor polish or wax that makes floors slippery. If you must use wax, use non-skid floor wax. Do not have throw rugs and other things on the floor that can make you trip. What can I do with my stairs? Do not leave any items on the stairs. Make sure that there are handrails on both sides of the stairs and use them. Fix handrails that are broken or loose. Make sure that handrails are as long as the stairways. Check any carpeting to make sure that it is firmly attached to the stairs. Fix any carpet that is loose or worn. Avoid having throw rugs at the top or bottom of the stairs. If you do have throw rugs, attach them to the floor with carpet tape. Make sure that you have a light switch at the top  of the stairs and the bottom of the stairs. If you do not have them, ask someone to add them for you. What else can I do to help prevent falls? Wear shoes that: Do not have high heels. Have rubber bottoms. Are comfortable and fit you well. Are closed at the toe. Do not wear sandals. If you use a stepladder: Make sure that it is fully opened. Do not climb a closed stepladder. Make sure that both sides of the stepladder are locked into place. Ask someone to hold it for you, if possible. Clearly mark and make sure that you can see: Any grab bars or handrails. First and last steps. Where the edge of each step is. Use tools that help you move around (mobility aids) if they are needed. These  include: Canes. Walkers. Scooters. Crutches. Turn on the lights when you go into a dark area. Replace any light bulbs as soon as they burn out. Set up your furniture so you have a clear path. Avoid moving your furniture around. If any of your floors are uneven, fix them. If there are any pets around you, be aware of where they are. Review your medicines with your doctor. Some medicines can make you feel dizzy. This can increase your chance of falling. Ask your doctor what other things that you can do to help prevent falls. This information is not intended to replace advice given to you by your health care provider. Make sure you discuss any questions you have with your health care provider. Document Released: 12/08/2008 Document Revised: 07/20/2015 Document Reviewed: 03/18/2014 Elsevier Interactive Patient Education  2017 [employer].

## 2021-08-08 NOTE — Progress Notes (Signed)
Subjective:   [name] is a 57 y.o. female who presents for Medicare Annual (Subsequent) preventive examination.  I connected with  [name] on 08/08/21 by a telephone enabled telemedicine application and verified that I am speaking with the correct person using two identifiers.   I discussed the limitations of evaluation and management by telemedicine. The patient expressed understanding and agreed to proceed.  Patient location: home  Provider location: Tele-Health- home    Review of Systems     Cardiac Risk Factors include: advanced age (>35mn, >>58women);diabetes mellitus;hypertension;obesity (BMI >30kg/m2);sedentary lifestyle     Objective:    Today's Vitals   08/08/21 1349  PainSc: 5    There is no height or weight on file to calculate BMI.     08/08/2021    1:57 PM 08/08/2021    1:25 PM 02/28/2021    4:48 PM 08/03/2020    1:02 PM 10/29/2017    9:06 AM 06/26/2016   11:10 AM 05/21/2016    1:09 PM  Advanced Directives  Does Patient Have a Medical Advance Directive? _0  No   Would patient like information on creating a medical advance directive?   No - Patient declined No - Patient declined Yes (MAU/Ambulatory/Procedural Areas - Information given) Yes (MAU/Ambulatory/Procedural Areas - Information given)      Information is confidential and restricted. Go to Review Flowsheets to unlock data.    Current Medications (verified) Outpatient Encounter Medications as of 08/08/2021  Medication Sig   atomoxetine (STRATTERA) 18 MG capsule Take 1 capsule (18 mg total) by mouth daily.   carvedilol (COREG) 25 MG tablet Take 1 tablet (25 mg total) by mouth 2 (two) times daily with a meal.   Dapagliflozin-metFORMIN HCl ER (XIGDUO XR) 11-998 MG TB24 Take 1 tablet by mouth daily.   fluticasone (FLONASE) 50 MCG/ACT nasal spray Place into both nostrils.   furosemide (LASIX) 80 MG tablet Take 1 tablet (80 mg total) by mouth daily.   LINZESS 145 MCG CAPS capsule TAKE 1  CAPSULE BY MOUTH DAILY BEFORE BREAKFAST.   LORazepam (ATIVAN) 0.5 MG tablet Take 1 tablet (0.5 mg total) by mouth daily as needed for anxiety.   nystatin (MYCOSTATIN/NYSTOP) powder Apply 1 application topically 3 (three) times daily.   potassium chloride SA (KLOR-CON M20) 20 MEQ tablet Take 2 tablets (40 mEq total) by mouth daily.   sacubitril-valsartan (ENTRESTO) 97-103 MG Take 1 tablet by mouth 2 (two) times daily.   simvastatin (ZOCOR) 20 MG tablet TAKE 1 TABLET BY MOUTH EVERY DAY   vortioxetine HBr (TRINTELLIX) 20 MG TABS tablet Take one tablet by mouth (20 mg total) daily.   No facility-administered encounter medications on file as of 08/08/2021.    Allergies (verified) Fluoxetine, Cymbalta [duloxetine hcl], and Penicillins   History: Past Medical History:  Diagnosis Date   ADD (attention deficit disorder)    Anxiety    Arthritis    both knees right worse than left   Carpal tunnel syndrome of right wrist    Depression    Essential hypertension, benign    Gallstones    History of smoking 06/22/2016   Hyperlipidemia associated with type 2 diabetes mellitus ([facility], on Zocor 04/04/2011   Hypertension associated with diabetes ([facility] 06/03/2008   Migraines    Morbid obesity ([facility]    Morbid obesity with BMI of 50.0-59.9, adult ([facility] [place] 07/12/2006   NIC[place] (nonischemic cardiomyopathy) ([facility] 07/12/2006   01/16/18 ECHO:    - Procedure narrative: Transthoracic echocardiography. Image  quality was suboptimal. The study was technically difficult.   Intravenous contrast (Definity) was administered. - Left ventricle: The cavity size was moderately dilated. Wall   thickness was increased in a pattern of mild LVH. Systolic   function was moderately to severely reduced. The estimated   ejection fraction w   Normal coronary arteries 04/17/2016   OSA on CPAP 10/28/2014   Spinal stenosis    back pain   Spondylosis without myelopathy or radiculopathy, lumbar region 12/04/2017   Type 2 diabetes mellitus with  hyperglycemia [facility])    Past Surgical History:  Procedure Laterality Date   CHOLECYSTECTOMY     DILATATION & CURETTAGE/HYSTEROSCOPY WITH MYOSURE N/A 10/31/2017   Procedure: DILATATION & CURETTAGE/HYSTEROSCOPY WITH MYOSURE;  Surgeon: Salvadore Dom, MD;  Location: [facility];  Service: Gynecology;  Laterality: N/A;   RIGHT/LEFT HEART CATH AND CORONARY ANGIOGRAPHY N/A 04/11/2016   Procedure: Right/Left Heart Cath and Coronary Angiography;  Surgeon: Burnell Blanks, MD;  Location: [facility];  Service: Cardiovascular;  Laterality: N/A;   sonogram for blood clots     no blockages   Family History  Problem Relation Age of Onset   Depression Mother    Anxiety disorder Mother    Diabetes Mother    Hypertension Mother    ADD / ADHD Father    Diabetes Father    Hypertension Father    Colitis Father    Hypertension Other    Diabetes Other    Colitis Other    Alcohol abuse Other    Social History   Socioeconomic History   Marital status: Married    Spouse name: Not on file   Number of children: Not on file   Years of education: Not on file   Highest education level: Not on file  Occupational History   Not on file  Tobacco Use   Smoking status: Former    Packs/day: 0.50    Types: Cigarettes    Quit date: 02/25/1997    Years since quitting: 24.4   Smokeless tobacco: Never   Tobacco comments:    Married, lives with spouse (when he is not traveling) and son  Vaping Use   Vaping Use: Never used  Substance and Sexual Activity   Alcohol use: No   Drug use: No   Sexual activity: Yes    Partners: Male    Birth control/protection: None  Other Topics Concern   Not on file  Social History Narrative   Not on file   Social Determinants of Health   Financial Resource Strain: High Risk (04/09/2021)   Overall Financial Resource Strain (CARDIA)    Difficulty of Paying Living Expenses: Very hard  Food Insecurity: Food Insecurity Present (04/09/2021)   Hunger Vital Sign     Worried About Running Out of Food in the Last Year: Sometimes true    Ran Out of Food in the Last Year: Sometimes true  Transportation Needs: No Transportation Needs (08/08/2021)   PRAPARE - Hydrologist (Medical): No    Lack of Transportation (Non-Medical): No  Physical Activity: Inactive (08/08/2021)   Exercise Vital Sign    Days of Exercise per Week: 0 days    Minutes of Exercise per Session: 0 min  Stress: Stress Concern Present (08/08/2021)   [place]    Feeling of Stress : Very much  Social Connections: Moderately Isolated (08/03/2020)   Social Connection and Isolation Panel [NHANES]  Frequency of Communication with Friends and Family: More than three times a week    Frequency of Social Gatherings with Friends and Family: Once a week    Attends Religious Services: Never    Marine scientist or Organizations: No    Attends Music therapist: Never    Marital Status: Married    Tobacco Counseling Counseling given: Not Answered Tobacco comments: Married, lives with spouse (when he is not traveling) and son   Clinical Intake:  Pre-visit preparation completed: Yes  Pain : 0-10 Pain Score: 5  Pain Location: Back Pain Descriptors / Indicators: Burning, Constant, Aching, Sharp Pain Onset: More than a month ago Pain Frequency: Intermittent Effect of Pain on Daily Activities: yes     Diabetes: Yes Did pt. bring in CBG monitor from home?: No  How often do you need to have someone help you when you read instructions, pamphlets, or other written materials from your doctor or pharmacy?: 1 - Never  Diabetic?   Yes  Nutrition Risk Assessment:  Has the patient had any N/V/D within the last 2 months?  No  Does the patient have any non-healing wounds?  No  Has the patient had any unintentional weight loss or weight gain?  No   Diabetes:  Is the patient diabetic?   Yes  If diabetic, was a CBG obtained today?  No  Did the patient bring in their glucometer from home?  No  How often do you monitor your CBG's? Does not check blood sugar.   Financial Strains and Diabetes Management:  Are you having any financial strains with the device, your supplies or your medication? No .  Does the patient want to be seen by Chronic Care Management for management of their diabetes?  No  Would the patient like to be referred to a Nutritionist or for Diabetic Management?  No   Diabetic Exams:  Diabetic Eye Exam: Pt has been advised about the importance in completing this exam. Diabetic Foot Exam:  Pt has been advised about the importance in completing this exam.   Interpreter Needed?: No  Information entered by :: Leroy Kennedy LPN   Activities of Daily Living    08/08/2021    1:58 PM  In your present state of health, do you have any difficulty performing the following activities:  Hearing? 0  Vision? 0  Difficulty concentrating or making decisions? 0  Walking or climbing stairs? 1  Dressing or bathing? 1  Doing errands, shopping? 1  Preparing Food and eating ? Y  Using the Toilet? N  In the past six months, have you accidently leaked urine? N  Do you have problems with loss of bowel control? N  Managing your Medications? Y  Managing your Finances? Y  Housekeeping or managing your Housekeeping? Y    Patient Care Team: [name], MD as PCP - General (Internal Medicine) Salvadore Dom, MD as PCP - OBGYN (Obstetrics and Gynecology) Minus Breeding, MD as PCP - Cardiology (Cardiology) Minus Breeding, MD as Consulting Physician (Cardiology) Knox Royalty, RN as Case Manager Laurance Flatten, Bosie Helper, LCSW as Social Worker (Licensed Clinical Social Worker)  Indicate any recent [employer] you may have received from other than [facility] providers in the past year (date may be approximate).     Assessment:   This is a routine wellness examination for  [place].  Hearing/Vision screen Hearing Screening - Comments:: No trouble hearing Vision Screening - Comments:: Not up to date  Dietary  issues and exercise activities discussed: Current Exercise Habits: The patient does not participate in regular exercise at present, Exercise limited by: orthopedic condition(s);psychological condition(s)   Goals Addressed             This Visit's Progress    Patient Stated       Find relief in my depression       Depression Screen    08/08/2021    1:31 PM 08/01/2021    3:29 PM 03/13/2021   10:30 AM 03/01/2021    2:33 PM 08/03/2020    1:04 PM 05/23/2020   10:42 AM 02/10/2020    2:14 PM  PHQ 2/9 Scores  PHQ - 2 Score _0 0  PHQ- 9 Score _1 0     Information is confidential and restricted. Go to Review Flowsheets to unlock data.    Fall Risk    08/08/2021    1:49 PM 06/29/2021    3:15 PM 05/16/2021    3:00 PM 03/13/2021   10:30 AM 08/03/2020    1:03 PM  [place] in the past year? 0   0 0  Comment  denies new/ recent falls since last outreach 05/16/21; reports continues to use walker regularly denies new/ recent falls since last outreach 04/17/21; continues using rollator walker    Number falls in past yr: 0   0 0  Injury with Fall? 0   0 0  Comment    N/A- no falls reported   Risk for fall due to :   Impaired mobility;History of fall(s);Orthopedic patient;Medication side effect;Other (Comment) Other (Comment) Impaired balance/gait  Risk for fall due to: Comment   chronic pain chronic pain- back   Follow up Falls evaluation completed;Education provided;Falls prevention discussed Falls prevention discussed Falls prevention discussed Falls prevention discussed;Education provided Falls evaluation completed  Comment    does not currently use assisitve devices- requesting rollator walker- will message PCP     FALL RISK PREVENTION PERTAINING TO THE HOME:  Any stairs in or around the home? Yes  If so, are there any without  handrails? No  Home free of loose throw rugs in walkways, pet beds, electrical cords, etc? Yes  Adequate lighting in your home to reduce risk of falls? Yes   ASSISTIVE DEVICES UTILIZED TO PREVENT FALLS:  Life alert? No  Use of a cane, walker or w/c? Yes  Grab bars in the bathroom? No  Shower chair or bench in shower? Yes  Elevated toilet seat or a handicapped toilet? No   TIMED UP AND GO:  Was the test performed? No .    Cognitive Function:        08/08/2021    1:58 PM  6CIT Screen  What Year? 0 points  What month? 0 points  What time? 0 points  Count back from 20 0 points  Months in reverse 4 points  Repeat phrase 0 points  Total Score 4 points    Immunizations Immunization History  Administered Date(s) Administered   DTaP 11/02/1965, 04/28/1967, 09/05/1970, 10/09/1972   IPV 11/02/1965, 11/25/1966, 08/05/1969, 08/26/1969   Influenza Split 01/15/2011, 11/18/2011   Influenza, High Dose Seasonal PF 01/15/2011, 11/18/2011, 11/27/2013, 03/30/2015   Influenza,inj,Quad PF,6+ Mos 11/27/2013, 03/30/2015, 12/29/2015, 01/13/2017, 11/04/2017, 01/08/2019, 02/10/2020, 03/01/2021   Influenza-Unspecified 01/09/2005, 11/27/2013, 03/30/2015   MMR 09/29/1997   Measles 09/12/1966   Mumps 11/01/1977   PFIZER(Purple Top)SARS-COV-2 Vaccination 09/24/2019, 10/29/2019, 07/11/2020   PNEUMOCOCCAL CONJUGATE-20 03/01/2021  Pneumococcal Polysaccharide-23 01/09/2005, 01/15/2011, 02/10/2020   Tdap 11/07/2017    TDAP status: Up to date  Flu Vaccine status: Up to date  Pneumococcal vaccine status: Up to date  Covid-19 vaccine status: Declined, Education has been provided regarding the importance of this vaccine but patient still declined. Advised may receive this vaccine at local pharmacy or Health Dept.or vaccine clinic. Aware to provide a copy of the vaccination record if obtained from local pharmacy or Health Dept. Verbalized acceptance and understanding.  Qualifies for Shingles Vaccine?  Yes   Zostavax completed No   Shingrix Completed?: No.    Education has been provided regarding the importance of this vaccine. Patient has been advised to call insurance company to determine out of pocket expense if they have not yet received this vaccine. Advised may also receive vaccine at local pharmacy or Health Dept. Verbalized acceptance and understanding.  Screening Tests Health Maintenance  Topic Date Due   Zoster Vaccines- Shingrix (1 of 2) Never done   MAMMOGRAM  10/11/2019   COVID-19 Vaccine (4 - Booster for Pfizer series) 08/17/2021 (Originally 09/05/2020)   HEMOGLOBIN A1C  08/29/2021   INFLUENZA VACCINE  09/25/2021   FOOT EXAM  03/01/2022   OPHTHALMOLOGY EXAM  04/04/2022   PAP SMEAR-Modifier  08/01/2024   COLONOSCOPY (Pts 45-39yr Insurance coverage will need to be confirmed)  02/11/2026   TETANUS/TDAP  11/08/2027   Hepatitis C Screening  Completed   HIV Screening  Completed   HPV VACCINES  Aged Out    Health Maintenance  Health Maintenance Due  Topic Date Due   Zoster Vaccines- Shingrix (1 of 2) Never done   MAMMOGRAM  10/11/2019    Colonoscopy 2017 [facility]  Mammogram status: Ordered  . Pt provided with contact info and advised to call to schedule appt.     Lung Cancer Screening: (Low Dose CT Chest recommended if Age 56-80years, 30 pack-year currently smoking OR have quit w/in 15years.) does not qualify.   Lung Cancer Screening Referral:   Additional Screening:  Hepatitis C Screening: does not qualify; Completed 2022  Vision Screening: Recommended annual ophthalmology exams for early detection of glaucoma and other disorders of the eye. Is the patient up to date with their annual eye exam?  No  Who is the provider or what is the name of the office in which the patient attends annual eye exams?  If pt is not established with a provider, would they like to be referred to a provider to establish care? No .   Dental Screening: Recommended annual  dental exams for proper oral hygiene  Community Resource Referral / Chronic Care Management: CRR required this visit?  No   CCM required this visit?  No      Plan:     I have personally reviewed and noted the following in the patient's chart:   Medical and social history Use of alcohol, tobacco or illicit drugs  Current medications and supplements including opioid prescriptions.  Functional ability and status Nutritional status Physical activity Advanced directives List of other physicians Hospitalizations, surgeries, and ER visits in previous 12 months Vitals Screenings to include cognitive, depression, and falls Referrals and appointments  In addition, I have reviewed and discussed with patient certain preventive protocols, quality metrics, and best practice recommendations. A written personalized care plan for preventive services as well as general preventive health recommendations were provided to patient.     JLeroy Kennedy LPN   [id number]/[id number]  Nurse Notes: patient stated she  does not have a good relationship with therapist, has called for a new referral .   Is feeling really down and depressed, no plans of suicide.     Patient is feeling bloated and full all the time.   It is a recurring thing, has been tested for this in past.  Has no appetite.   Patient states has a spot she has marked by her breast she has concerns   Patient is scheduled for an appointment 08-09-2021 @ 11;20   [place].

## 2021-08-09 ENCOUNTER — Ambulatory Visit (INDEPENDENT_AMBULATORY_CARE_PROVIDER_SITE_OTHER): Payer: Medicare Other | Admitting: Internal Medicine

## 2021-08-09 ENCOUNTER — Encounter: Payer: Self-pay | Admitting: Internal Medicine

## 2021-08-09 VITALS — BP 132/74 | HR 82 | Temp 98.2°F | Resp 16 | Ht 64.0 in | Wt 310.0 lb

## 2021-08-09 DIAGNOSIS — F988 Other specified behavioral and emotional disorders with onset usually occurring in childhood and adolescence: Secondary | ICD-10-CM | POA: Insufficient documentation

## 2021-08-09 DIAGNOSIS — E1159 Type 2 diabetes mellitus with other circulatory complications: Secondary | ICD-10-CM | POA: Diagnosis not present

## 2021-08-09 DIAGNOSIS — F332 Major depressive disorder, recurrent severe without psychotic features: Secondary | ICD-10-CM | POA: Insufficient documentation

## 2021-08-09 DIAGNOSIS — F900 Attention-deficit hyperactivity disorder, predominantly inattentive type: Secondary | ICD-10-CM | POA: Insufficient documentation

## 2021-08-09 DIAGNOSIS — E1165 Type 2 diabetes mellitus with hyperglycemia: Secondary | ICD-10-CM

## 2021-08-09 DIAGNOSIS — Z1231 Encounter for screening mammogram for malignant neoplasm of breast: Secondary | ICD-10-CM

## 2021-08-09 DIAGNOSIS — N3941 Urge incontinence: Secondary | ICD-10-CM

## 2021-08-09 DIAGNOSIS — Z23 Encounter for immunization: Secondary | ICD-10-CM | POA: Insufficient documentation

## 2021-08-09 DIAGNOSIS — K5904 Chronic idiopathic constipation: Secondary | ICD-10-CM | POA: Diagnosis not present

## 2021-08-09 DIAGNOSIS — F3341 Major depressive disorder, recurrent, in partial remission: Secondary | ICD-10-CM

## 2021-08-09 DIAGNOSIS — I152 Hypertension secondary to endocrine disorders: Secondary | ICD-10-CM | POA: Diagnosis not present

## 2021-08-09 LAB — CBC WITH DIFFERENTIAL/PLATELET
Basophils Absolute: 0.1 10*3/uL (ref 0.0–0.1)
Basophils Relative: 0.8 % (ref 0.0–3.0)
Eosinophils Absolute: 0.1 10*3/uL (ref 0.0–0.7)
Eosinophils Relative: 1.6 % (ref 0.0–5.0)
HCT: 48.5 % — ABNORMAL HIGH (ref 36.0–46.0)
Hemoglobin: 15.9 g/dL — ABNORMAL HIGH (ref 12.0–15.0)
Lymphocytes Relative: 19.5 % (ref 12.0–46.0)
Lymphs Abs: 1.7 10*3/uL (ref 0.7–4.0)
MCHC: 32.7 g/dL (ref 30.0–36.0)
MCV: 86.8 fl (ref 78.0–100.0)
Monocytes Absolute: 0.6 10*3/uL (ref 0.1–1.0)
Monocytes Relative: 6.4 % (ref 3.0–12.0)
Neutro Abs: 6.2 10*3/uL (ref 1.4–7.7)
Neutrophils Relative %: 71.7 % (ref 43.0–77.0)
Platelets: 241 10*3/uL (ref 150.0–400.0)
RBC: 5.59 Mil/uL — ABNORMAL HIGH (ref 3.87–5.11)
RDW: 14.4 % (ref 11.5–15.5)
WBC: 8.7 10*3/uL (ref 4.0–10.5)

## 2021-08-09 LAB — BASIC METABOLIC PANEL
BUN: 19 mg/dL (ref 6–23)
CO2: 30 mEq/L (ref 19–32)
Calcium: 9.1 mg/dL (ref 8.4–10.5)
Chloride: 101 mEq/L (ref 96–112)
Creatinine, Ser: 0.81 mg/dL (ref 0.40–1.20)
GFR: 81.27 mL/min (ref >60.00)
Glucose, Bld: 120 mg/dL — ABNORMAL HIGH (ref 70–99)
Potassium: 4 mEq/L (ref 3.5–5.1)
Sodium: 140 mEq/L (ref 135–145)

## 2021-08-09 LAB — URINALYSIS, ROUTINE W REFLEX MICROSCOPIC
Bilirubin Urine: NEGATIVE
Hgb urine dipstick: NEGATIVE
Ketones, ur: NEGATIVE
Leukocytes,Ua: NEGATIVE
Nitrite: NEGATIVE
Specific Gravity, Urine: 1.015 (ref 1.000–1.030)
Total Protein, Urine: NEGATIVE
Urine Glucose: >=1000 — AB
Urobilinogen, UA: 0.2 (ref 0.0–1.0)
pH: 5.5 (ref 5.0–8.0)

## 2021-08-09 LAB — HEMOGLOBIN A1C: Hgb A1c MFr Bld: 6.7 % — ABNORMAL HIGH (ref 4.6–6.5)

## 2021-08-09 LAB — TSH: TSH: 4.88 u[IU]/mL (ref 0.35–5.50)

## 2021-08-09 MED ORDER — VORTIOXETINE HBR 20 MG PO TABS: ORAL_TABLET | ORAL | 1 refills | Status: DC

## 2021-08-09 MED ORDER — SHINGRIX 50 MCG/0.5ML IM SUSR: 0.5000 mL | Freq: Once | INTRAMUSCULAR | 1 refills | Status: AC

## 2021-08-09 MED ORDER — RYBELSUS 3 MG PO TABS: 3.0000 mg | ORAL_TABLET | Freq: Every day | ORAL | 0 refills | Status: DC

## 2021-08-09 MED ORDER — ATOMOXETINE HCL 18 MG PO CAPS: 18.0000 mg | ORAL_CAPSULE | Freq: Every day | ORAL | 1 refills | Status: DC

## 2021-08-09 MED ORDER — ATOMOXETINE HCL 18 MG PO CAPS
18.0000 mg | ORAL_CAPSULE | Freq: Every day | ORAL | 1 refills | Status: DC
Start: 2021-08-09 — End: 2021-08-09

## 2021-08-09 MED ORDER — LINACLOTIDE 145 MCG PO CAPS: ORAL_CAPSULE | ORAL | 1 refills | Status: DC

## 2021-08-09 NOTE — Patient Instructions (Addendum)
[facility] [phone]    Type 2 Diabetes Mellitus, Diagnosis, Adult Type 2 diabetes (type 2 diabetes mellitus) is a long-term, or chronic, disease. In type 2 diabetes, one or both of these problems may be present: The pancreas does not make enough of a hormone called insulin. Cells in the body do not respond properly to the insulin that the body makes (insulin resistance). Normally, insulin allows blood sugar (glucose) to enter cells in the body. The cells use glucose for energy. Insulin resistance or lack of insulin causes excess glucose to build up in the blood instead of going into cells. This causes high blood glucose (hyperglycemia).  What are the causes? The exact cause of type 2 diabetes is not known. What increases the risk? The following factors may make you more likely to develop this condition: Having a family member with type 2 diabetes. Being overweight or obese. Being inactive (sedentary). Having been diagnosed with insulin resistance. Having a history of prediabetes, diabetes when you were pregnant (gestational diabetes), or polycystic ovary syndrome (PCOS). What are the signs or symptoms? In the early stage of this condition, you may not have symptoms. Symptoms develop slowly and may include: Increased thirst or hunger. Increased urination. Unexplained weight loss. Tiredness (fatigue) or weakness. Vision changes, such as blurry vision. Dark patches on the skin. How is this diagnosed? This condition is diagnosed based on your symptoms, your medical history, a physical exam, and your blood glucose level. Your blood glucose may be checked with one or more of the following blood tests: A fasting blood glucose (FBG) test. You will not be allowed to eat (you will fast) for 8 hours or longer before a blood sample is taken. A random blood glucose test. This test checks blood glucose at any time of day regardless of when you ate. An A1C (hemoglobin  A1C) blood test. This test provides information about blood glucose levels over the previous 2-3 months. An oral glucose tolerance test (OGTT). This test measures your blood glucose at two times: After fasting. This is your baseline blood glucose level. Two hours after drinking a beverage that contains glucose. You may be diagnosed with type 2 diabetes if: Your fasting blood glucose level is 126 mg/dL (7.0 mmol/L) or higher. Your random blood glucose level is 200 mg/dL (11.1 mmol/L) or higher. Your A1C level is 6.5% or higher. Your oral glucose tolerance test result is higher than 200 mg/dL (11.1 mmol/L). These blood tests may be repeated to confirm your diagnosis. How is this treated? Your treatment may be managed by a specialist called an endocrinologist. Type 2 diabetes may be treated by following instructions from your health care provider about: Making dietary and lifestyle changes. These may include: Following a personalized nutrition plan that is developed by a registered dietitian. Exercising regularly. Finding ways to manage stress. Checking your blood glucose level as often as told. Taking diabetes medicines or insulin daily. This helps to keep your blood glucose levels in the healthy range. Taking medicines to help prevent complications from diabetes. Medicines may include: Aspirin. Medicine to lower cholesterol. Medicine to control blood pressure. Your health care provider will set treatment goals for you. Your goals will be based on your age, other medical conditions you have, and how you respond to diabetes treatment. Generally, the goal of treatment is to maintain the following blood glucose levels: Before meals: 80-130 mg/dL (4.4-7.2 mmol/L). After meals: below 180 mg/dL (10 mmol/L). A1C level: less than 7%. Follow these instructions  at home: Questions to ask your health care provider Consider asking the following questions: Should I meet with a certified diabetes care  and education specialist? What diabetes medicines do I need, and when should I take them? What equipment will I need to manage my diabetes at home? How often do I need to check my blood glucose? Where can I find a support group for people with diabetes? What number can I call if I have questions? When is my next appointment? General instructions Take over-the-counter and prescription medicines only as told by your health care provider. Keep all follow-up visits. This is important. Where to find more information For help and guidance and for more information about diabetes, please visit: American Diabetes Association (ADA): www.diabetes.org American Association of Diabetes Care and Education Specialists (ADCES): www.diabeteseducator.org International Diabetes Federation (IDF): MemberVerification.ca Contact a health care provider if: Your blood glucose is at or above 240 mg/dL (13.3 mmol/L) for 2 days in a row. You have been sick or have had a fever for 2 days or longer, and you are not getting better. You have any of the following problems for more than 6 hours: You cannot eat or drink. You have nausea and vomiting. You have diarrhea. Get help right away if: You have severe hypoglycemia. This means your blood glucose is lower than 54 mg/dL (3.0 mmol/L). You become confused or you have trouble thinking clearly. You have difficulty breathing. You have moderate or large ketone levels in your urine. These symptoms may represent a serious problem that is an emergency. Do not wait to see if the symptoms will go away. Get medical help right away. Call your local emergency services (911 in the [place] Do not drive yourself to the hospital. Summary Type 2 diabetes mellitus is a long-term, or chronic, disease. In type 2 diabetes, the pancreas does not make enough of a hormone called insulin, or cells in the body do not respond properly to insulin that the body makes. This condition is treated by making  dietary and lifestyle changes and taking diabetes medicines or insulin. Your health care provider will set treatment goals for you. Your goals will be based on your age, other medical conditions you have, and how you respond to diabetes treatment. Keep all follow-up visits. This is important. This information is not intended to replace advice given to you by your health care provider. Make sure you discuss any questions you have with your health care provider. Document Revised: 05/08/2020 Document Reviewed: 05/08/2020 Elsevier Patient Education  [place].

## 2021-08-09 NOTE — Progress Notes (Unsigned)
Subjective:  Patient ID: [name], female    DOB: Jul 14, 1965  Age: 56 y.o. MRN: [id number]  CC: Diabetes and Hypertension   HPI [name] presents for f/up -  She denies chest pain, diaphoresis, or edema.  She continues to complain of a full sensation in her abdomen but she denies nausea, vomiting, diarrhea, or bright red blood per rectum.  She continues to struggle with constipation.  She is changing psychiatrist and asked that I prescribe her Trintellix and Strattera.  Pt is calling to requesting a new referral to a new therapists. Pt was seeing a Therapist and had a great rapport but wasn't getting better. Pt was then seen by another person in the same office but that wasn't a good fit.     Now pt is requesting to see  Dr. Norma Fredrickson  [id number], [place], [place] [address]   Outpatient Medications Prior to Visit  Medication Sig Dispense Refill   carvedilol (COREG) 25 MG tablet Take 1 tablet (25 mg total) by mouth 2 (two) times daily with a meal. 180 tablet 3   Dapagliflozin-metFORMIN HCl ER (XIGDUO XR) 11-998 MG TB24 Take 1 tablet by mouth daily. 90 tablet 1   fluticasone (FLONASE) 50 MCG/ACT nasal spray Place into both nostrils.     furosemide (LASIX) 80 MG tablet Take 1 tablet (80 mg total) by mouth daily. 90 tablet 3   LORazepam (ATIVAN) 0.5 MG tablet Take 1 tablet (0.5 mg total) by mouth daily as needed for anxiety. 20 tablet 0   nystatin (MYCOSTATIN/NYSTOP) powder Apply 1 application topically 3 (three) times daily. 120 g 3   potassium chloride SA (KLOR-CON M20) 20 MEQ tablet Take 2 tablets (40 mEq total) by mouth daily. 180 tablet 3   sacubitril-valsartan (ENTRESTO) 97-103 MG Take 1 tablet by mouth 2 (two) times daily. 180 tablet 3   simvastatin (ZOCOR) 20 MG tablet TAKE 1 TABLET BY MOUTH EVERY DAY 90 tablet 1   atomoxetine (STRATTERA) 18 MG capsule TAKE 1 CAPSULE BY MOUTH DAILY. 30 capsule 0   LINZESS 145 MCG CAPS capsule TAKE 1 CAPSULE BY MOUTH DAILY  BEFORE BREAKFAST. 90 capsule 1   vortioxetine HBr (TRINTELLIX) 20 MG TABS tablet Take one tablet by mouth (20 mg total) daily. 30 tablet 1   No facility-administered medications prior to visit.    ROS Review of Systems  Constitutional:  Positive for fatigue. Negative for diaphoresis and unexpected weight change.  HENT: Negative.  Negative for trouble swallowing.   Eyes: Negative.   Respiratory:  Positive for shortness of breath. Negative for cough, chest tightness and wheezing.   Gastrointestinal:  Positive for constipation. Negative for abdominal pain, blood in stool, diarrhea, nausea and vomiting.  Endocrine: Negative.   Genitourinary:  Positive for urgency. Negative for decreased urine volume, difficulty urinating, dysuria, frequency and hematuria.  Musculoskeletal:  Positive for arthralgias and back pain. Negative for joint swelling and myalgias.  Skin: Negative.   Neurological: Negative.  Negative for dizziness, weakness, light-headedness and headaches.  Hematological:  Negative for adenopathy. Does not bruise/bleed easily.  Psychiatric/Behavioral:  Positive for decreased concentration and dysphoric mood. Negative for behavioral problems, confusion, sleep disturbance and suicidal ideas. The patient is nervous/anxious.     Objective:  BP 132/74 (BP Location: Left Arm, Patient Position: Sitting, Cuff Size: Large)   Pulse 82   Temp 98.2 F (36.8 C) (Oral)   Resp 16   Ht '5\' 4"'$  (1.626 m)   Wt (!) 310 lb (  140.6 kg)   SpO2 94%   BMI 53.21 kg/m   BP Readings from Last 3 Encounters:  08/09/21 132/74  08/01/21 135/86  06/25/21 126/76    Wt Readings from Last 3 Encounters:  08/09/21 (!) 310 lb (140.6 kg)  08/01/21 (!) 307 lb (139.3 kg)  06/25/21 (!) 314 lb (142.4 kg)    Physical Exam Constitutional:      General: She is not in acute distress.    Appearance: She is obese. She is not toxic-appearing or diaphoretic.  HENT:     Nose: Nose normal.     Mouth/Throat:      Mouth: Mucous membranes are moist.  Eyes:     General: No scleral icterus.    Conjunctiva/sclera: Conjunctivae normal.  Cardiovascular:     Rate and Rhythm: Normal rate and regular rhythm.     Heart sounds: No murmur heard. Pulmonary:     Effort: Pulmonary effort is normal.     Breath sounds: No stridor. No wheezing, rhonchi or rales.  Abdominal:     General: Abdomen is protuberant. Bowel sounds are decreased. There is no distension.     Palpations: Abdomen is soft. There is no hepatomegaly, splenomegaly or mass.  Musculoskeletal:        General: Normal range of motion.     Cervical back: Neck supple.     Right lower leg: No edema.     Left lower leg: No edema.  Lymphadenopathy:     Cervical: No cervical adenopathy.  Skin:    General: Skin is warm and dry.  Neurological:     General: No focal deficit present.     Mental Status: She is alert. Mental status is at baseline.  Psychiatric:        Attention and Perception: Perception normal. She is inattentive.        Mood and Affect: Mood is anxious and depressed. Affect is angry. Affect is not blunt, flat or tearful.        Speech: She is communicative. Speech is tangential. Speech is not rapid and pressured or delayed.        Behavior: Behavior normal. Behavior is cooperative.        Thought Content: Thought content normal. Thought content is not paranoid or delusional. Thought content does not include homicidal or suicidal ideation.        Cognition and Memory: Cognition normal.        Judgment: Judgment normal.     Lab Results  Component Value Date   WBC 8.7 08/09/2021   HGB 15.9 (H) 08/09/2021   HCT 48.5 (H) 08/09/2021   PLT 241.0 08/09/2021   GLUCOSE 120 (H) 08/09/2021   CHOL 176 03/01/2021   TRIG 110.0 03/01/2021   HDL 67.60 03/01/2021   LDLDIRECT 143.8 04/03/2011   LDLCALC 86 03/01/2021   ALT 15 03/01/2021   AST 13 03/01/2021   NA 140 08/09/2021   K 4.0 08/09/2021   CL 101 08/09/2021   CREATININE 0.81  08/09/2021   BUN 19 08/09/2021   CO2 30 08/09/2021   TSH 4.88 08/09/2021   INR 1.0 04/08/2016   HGBA1C 6.7 (H) 08/09/2021   MICROALBUR 1.6 03/01/2021    No results found.  Assessment & Plan:   Reylynn was seen today for diabetes and hypertension.  Diagnoses and all orders for this visit:  Hypertension associated with diabetes ([place])- Her blood pressure is adequately well controlled. -     Basic metabolic panel; Future -  CBC with Differential/Platelet; Future -     TSH; Future -     Urinalysis, Routine w reflex microscopic; Future -     Basic metabolic panel -     CBC with Differential/Platelet -     TSH -     Urinalysis, Routine w reflex microscopic  Chronic idiopathic constipation -     Basic metabolic panel; Future -     TSH; Future -     linaclotide (LINZESS) 145 MCG CAPS capsule; TAKE 1 CAPSULE BY MOUTH DAILY BEFORE BREAKFAST. -     Basic metabolic panel -     TSH  Type 2 diabetes mellitus with hyperglycemia, without long-term current use of insulin ([place])- Her blood sugar is well controlled.  Will add semaglutide for cardiovascular risk reduction. -     Basic metabolic panel; Future -     CBC with Differential/Platelet; Future -     Hemoglobin A1c; Future -     Basic metabolic panel -     CBC with Differential/Platelet -     Hemoglobin A1c -     Semaglutide (RYBELSUS) 3 MG TABS; Take 3 mg by mouth daily.  Recurrent major depressive disorder, in partial remission ([place]) -     Ambulatory referral to Psychiatry  Urge incontinence -     Urinalysis, Routine w reflex microscopic; Future -     CULTURE, URINE COMPREHENSIVE; Future -     Urinalysis, Routine w reflex microscopic -     CULTURE, URINE COMPREHENSIVE  Major depressive disorder, recurrent episode, severe with anxious distress (HCC) -     vortioxetine HBr (TRINTELLIX) 20 MG TABS tablet; Take one tablet by mouth (20 mg total) daily.  Attention deficit hyperactivity disorder (ADHD), predominantly inattentive  type -     atomoxetine (STRATTERA) 18 MG capsule; Take 1 capsule (18 mg total) by mouth daily.  Visit for screening mammogram  Need for varicella vaccine -     Zoster Vaccine Adjuvanted [facility]) injection; Inject 0.5 mLs into the muscle once for 1 dose.  Other orders -     Discontinue: atomoxetine (STRATTERA) 18 MG capsule; Take 1 capsule (18 mg total) by mouth daily.   I have changed Tikita Prest's Linzess to linaclotide. I am also having her start on Shingrix and Rybelsus. Additionally, I am having her maintain her Xigduo XR, potassium chloride SA, furosemide, sacubitril-valsartan, nystatin, LORazepam, simvastatin, fluticasone, carvedilol, vortioxetine HBr, and atomoxetine.  Meds ordered this encounter  Medications   vortioxetine HBr (TRINTELLIX) 20 MG TABS tablet    Sig: Take one tablet by mouth (20 mg total) daily.    Dispense:  90 tablet    Refill:  1   linaclotide (LINZESS) 145 MCG CAPS capsule    Sig: TAKE 1 CAPSULE BY MOUTH DAILY BEFORE BREAKFAST.    Dispense:  90 capsule    Refill:  1   DISCONTD: atomoxetine (STRATTERA) 18 MG capsule    Sig: Take 1 capsule (18 mg total) by mouth daily.    Dispense:  90 capsule    Refill:  1   atomoxetine (STRATTERA) 18 MG capsule    Sig: Take 1 capsule (18 mg total) by mouth daily.    Dispense:  90 capsule    Refill:  1   Zoster Vaccine Adjuvanted [facility]) injection    Sig: Inject 0.5 mLs into the muscle once for 1 dose.    Dispense:  0.5 mL    Refill:  1   Semaglutide (RYBELSUS) 3 MG TABS  Sig: Take 3 mg by mouth daily.    Dispense:  30 tablet    Refill:  0     Follow-up: Return in about 6 months (around 02/08/2022).  [name], MD

## 2021-08-11 LAB — CULTURE, URINE COMPREHENSIVE: RESULT:: NO GROWTH

## 2021-08-13 NOTE — Chronic Care Management (AMB) (Signed)
  Chronic Care Management Note  08/13/2021 Name: [name] MRN: [id number] DOB: [birth year]  [name] is a 56 y.o. year old female who is a primary care patient of [name], MD and is actively engaged with the care management team. I reached out to [name] by phone today to assist with re-scheduling an initial visit with the RN Case Manager  Follow up plan: Telephone appointment with care management team member scheduled for:08/21/21  [place] [facility]  Direct Dial: [phone]

## 2021-08-15 ENCOUNTER — Telehealth: Payer: Self-pay | Admitting: Internal Medicine

## 2021-08-15 NOTE — Telephone Encounter (Signed)
Pt called in to r/s appt on 6.27.2023. States her son has an appt that will conflict.   Please call ASAP to r/s

## 2021-08-17 ENCOUNTER — Telehealth (HOSPITAL_COMMUNITY): Payer: Medicare Other | Admitting: Psychiatry

## 2021-08-21 ENCOUNTER — Telehealth: Payer: Medicare Other

## 2021-08-27 ENCOUNTER — Ambulatory Visit: Payer: Self-pay | Admitting: *Deleted

## 2021-08-27 ENCOUNTER — Encounter: Payer: Self-pay | Admitting: *Deleted

## 2021-08-27 ENCOUNTER — Ambulatory Visit (INDEPENDENT_AMBULATORY_CARE_PROVIDER_SITE_OTHER): Payer: Medicare Other | Admitting: Licensed Clinical Social Worker

## 2021-08-27 DIAGNOSIS — F332 Major depressive disorder, recurrent severe without psychotic features: Secondary | ICD-10-CM

## 2021-08-27 DIAGNOSIS — E1165 Type 2 diabetes mellitus with hyperglycemia: Secondary | ICD-10-CM

## 2021-08-27 DIAGNOSIS — I5022 Chronic systolic (congestive) heart failure: Secondary | ICD-10-CM

## 2021-08-27 NOTE — Chronic Care Management (AMB) (Signed)
Chronic Care Management   CCM RN Visit Note  08/27/2021 Name: [name] MRN: [id number] DOB: [birth year]  Subjective: [name] is a 56 y.o. year old female who is a primary care patient of [name], MD. The care management team was consulted for assistance with disease management and care coordination needs.    Collaboration with CCM CSW [name]  for  CCM case closure  in response to provider referral for case management and/or care coordination services.   Consent to Services:  The patient was given information about Chronic Care Management services, agreed to services, and gave verbal consent prior to initiation of services.  Please see initial visit note for detailed documentation.  Patient agreed to services and verbal consent obtained.   Assessment: Review of patient past medical history, allergies, medications, health status, including review of consultants reports, laboratory and other test data, was performed as part of comprehensive evaluation and provision of chronic care management services.  CCM Care Plan  Allergies  Allergen Reactions   Fluoxetine Other (See Comments)    More depressed   Cymbalta [Duloxetine Hcl] Other (See Comments)    depressed   Penicillins     REACTION: rash Has patient had a PCN reaction causing immediate rash, facial/tongue/throat swelling, SOB or lightheadedness with hypotension: YES Has patient had a PCN reaction causing severe rash involving mucus membranes or skin necrosis: NO Has patient had a PCN reaction that required hospitalization: YES Has patient had a PCN reaction occurring within the last 10 years: NO If all of the above answers are "NO", then may proceed with Cephalosporin use.    Outpatient Encounter Medications as of 08/27/2021  Medication Sig Note   atomoxetine (STRATTERA) 18 MG capsule Take 1 capsule (18 mg total) by mouth daily.    carvedilol (COREG) 25 MG tablet Take 1 tablet (25 mg total) by mouth 2 (two)  times daily with a meal.    Dapagliflozin-metFORMIN HCl ER (XIGDUO XR) 11-998 MG TB24 Take 1 tablet by mouth daily.    fluticasone (FLONASE) 50 MCG/ACT nasal spray Place into both nostrils. 06/25/2021: As needed.   furosemide (LASIX) 80 MG tablet Take 1 tablet (80 mg total) by mouth daily.    linaclotide (LINZESS) 145 MCG CAPS capsule TAKE 1 CAPSULE BY MOUTH DAILY BEFORE BREAKFAST.    LORazepam (ATIVAN) 0.5 MG tablet Take 1 tablet (0.5 mg total) by mouth daily as needed for anxiety.    nystatin (MYCOSTATIN/NYSTOP) powder Apply 1 application topically 3 (three) times daily.    potassium chloride SA (KLOR-CON M20) 20 MEQ tablet Take 2 tablets (40 mEq total) by mouth daily.    sacubitril-valsartan (ENTRESTO) 97-103 MG Take 1 tablet by mouth 2 (two) times daily.    Semaglutide (RYBELSUS) 3 MG TABS Take 3 mg by mouth daily.    simvastatin (ZOCOR) 20 MG tablet TAKE 1 TABLET BY MOUTH EVERY DAY    vortioxetine HBr (TRINTELLIX) 20 MG TABS tablet Take one tablet by mouth (20 mg total) daily.    No facility-administered encounter medications on file as of 08/27/2021.   Patient Active Problem List   Diagnosis Date Noted   Major depressive disorder, recurrent episode, severe with anxious distress ([place]) 08/09/2021   Attention deficit hyperactivity disorder (ADHD), predominantly inattentive type 08/09/2021   Need for varicella vaccine 08/09/2021   Urge incontinence 08/01/2021   Tinea corporis 03/14/2021   Hyperlipidemia LDL goal <100 03/01/2021   DDD (degenerative disc disease), lumbar 08/08/2020   Non-seasonal allergic rhinitis due  to pollen 07/25/2020   Chronic idiopathic constipation 07/25/2020   Visit for screening mammogram 02/11/2020   Chronic bilateral low back pain without sciatica 02/10/2020   Spondylosis without myelopathy or radiculopathy, lumbar region 12/04/2017   Type 2 diabetes mellitus with hyperglycemia ([place])    Normal coronary arteries 04/17/2016   OSA on CPAP 10/28/2014   Major  depressive disorder 29/47/6546   Chronic systolic CHF (congestive heart failure), NYHA class 3 ([place]) 12/06/2013   Hyperlipidemia associated with type 2 diabetes mellitus ([place]), on Zocor 04/04/2011   Abnormal electrocardiography 01/16/2009   Hypertension associated with diabetes ([place]) 06/03/2008   Morbid obesity with BMI of 50.0-59.9, adult ([place]) 07/12/2006   NICM (nonischemic cardiomyopathy) ([place]) 07/12/2006   Attention deficit disorder 07/12/2006   Migraine without status migrainosus, not intractable 07/12/2006   Conditions to be addressed/monitored:  CHF and DMII  Care Plan : RN Care Manager Plan of Care  Updates made by Knox Royalty, RN since 08/27/2021 12:00 AM     Problem: Chronic Disease Management Needs   Priority: High     Long-Range Goal: Ongoing adherence to established Plan of Care for long term chronic disease management   Start Date: 03/13/2021  Expected End Date: 03/13/2022  Priority: High  Note:   Current Barriers:  Chronic Disease Management support and education needs related to CHF, DMII, and severe depression Chronic back pain: reports "9/10" chronic back on daily basis: states ongoing pain prevents her from going upstairs to sleep in bedroom, she is currently sleeping downstairs; reports chronic pain makes her unable to do iADL's and her husband is essentially her caregiver for both ADL's and iADL's; she declines referral for pain management provider; states she rarely takes medications for pain; patient also reports that in the past PCP has referred her to outpatient rehabilitation/ PT, however, she never followed up because she is "so immobilized by depression and pain" that she could "never bring herself" to call and schedule with rehabilitation team-- she is currently not interested in another outpatient rehabilitation referral Ongoing depression- reports long-standing history depression in her family and herself; reports she had psychiatric provider that she  "liked" very much, however, she did not feel that she was making progress in therapy with previous psychiatric provider: states she did not believe the medications provider prescribed was helping, and "all he wanted to do was to increase the doses;" today, 03/13/21- she confirms that she is not under psychiatric care and that she is NOT taking prescribed medications for depression 04/17/21: Patient confirms she had initial visit with psychiatric provider; today she tells me that her follow up in-person visit with psychiatric provider was cancelled by the provider, and that the office team is unable to re-schedule a timely appointment; she is very frustrated by this and feels she needs to be seen in person by provider as soon as possible so that she can be placed on the "right" antidepressant -- she agrees today to continue her efforts to follow up to ensure that this appointment is re- scheduled 05/16/21: confirms she attended in-person office visit with psychiatric provider 05/03/21; confirms she will have ongoing follow up with provider- this was encouraged 06/29/21: states she had "a horrible" visit with psychiatric provider "last week;" she tells me that "he doesn't give a shit about his patient's - you all should not send any more patient's to him; He does not listen to his patient's and he does not care;" patient is very animated and adamant with her comments; states  she does not believe she "can ever go back to see him again;" she acknowledges that she has maintained communication with CCM CSW and I encouraged her to discuss with CCM CSW-- she will not discuss details of this visit with me further today, stated, "it just gets my blood pressure up when I thinks about it;" I will copy today's note to CCM CSW as an [place] 08/27/21: confirmed through care coordination/ collaboration with CCM CSW that patient is now established with another psychiatric provider Self- acknowledges lack of motivation in adhering to plan  of care around state of health: she attributes this to both her ongoing severe depression and her chronic back pain Need for resources for affordable dental services and for food acquisition for FRESH food only: has tried other food pantries, but "they are all unhealthy food choices/ canned" food, which patient is not interested in: [place] referral placed 04/17/21: confirmed [employer] reached out to patient several times, without success: provided patient with number for care guide should she wish to engage with them in the future to obtain resources 05/16/21: confirmed patient spoke with [employer] care guide 04/26/21; confirms she received printed information that was mailed to her- however, she has not yet read or acted on the resources that were provided- she was encouraged to do so promptly 06/29/21: followed up with patient on her progress contacting dental providers: she has not yet followed up on resources provided; she tells me today, "the list they sent me is just a long list of dentists-- I don't have time or desire to call all of them to see which will take medicaid patient's, I need for someone to do that for me;" re- explained role of [employer]-- explained that there are different types of medicaid and that it is the responsibility of the patient to act on resources as she has her own knowledge of her specific insurance; I offered to place another care guide referral which she declines- she states, "if they won't just give me a list of dentists that take they type of medicaid I have, or call the dentist for me to find out if they take my medicaid, don't bother" Need for DME: reports she would like a rollator walker, "for a large person:" states she asked PCP about this at time of PCP office visit and has not yet heard anything; I will reach out to PCP to re-request on patient's behalf 04/10/21: second request for requested DME placed  with PCP office team 04/17/21: confirmed patient has finally received the appropriate rolling walker that she requested 05/16/21- confirms this issue is now resolved: she reports using walker regularly and denies new/ recent falls  RNCM Clinical Goal(s):  Patient will demonstrate improved health management independence as evidenced by adherence to plan of care for depression, CHF, and DMII          through collaboration with RN Care manager, provider, and care team.   Interventions: 1:1 collaboration with primary care provider regarding development and update of comprehensive plan of care as evidenced by provider attestation and co-signature Inter-disciplinary care team collaboration (see longitudinal plan of care) Evaluation of current treatment plan related to  self management and patient's adherence to plan as established by provider 03/13/21: Initial Assessment completed Review of patient status, including review of consultants reports, relevant laboratory and other test results, and medications completed 08/27/21: care coordination/ collaboration outreach with CCM CSW, who confirms that patient is now established  with new psychiatric provider and denies ongoing care coordination/ care management needs; CCM RN CM case closure accordingly  Heart Failure Interventions:  (Status: 08/27/21: Goal Met.)  Long Term Goal  Confirmed through care coordination outreach with CCM CSW that patient denies ongoing care coordination/ care management needs  Diabetes:  (Status: 08/27/21: Goal Met.) Long Term Goal   Lab Results  Component Value Date   HGBA1C 6.9 (H) 03/01/2021  Confirmed through care coordination outreach with CCM CSW that patient denies ongoing care coordination/ care management needs     Plan: No further follow up required: Confirmed through care coordination outreach with CCM CSW that patient denies ongoing care coordination/ care management needs; CCM RN CM case closure  accordingly  Oneta Rack, RN, BSN, [place] [phone]: direct office

## 2021-08-27 NOTE — Patient Instructions (Signed)
Visit Information  Congratulations on achieving your goals! It was a pleasure working with you, and I hope you continue to make great strides in improving your health. No Follow up Scheduled:  You do not require continued follow-up by care coordination team Per our conversation, I will disconnect from your care team at this time Please contact the office if needed  Following are the goals we discussed today:  Keep appointment with Dr. Casimiro Needle at [facility] on Aug. 8th at 10:00   If you are experiencing a Mental Health or [place] or need someone to talk to, please call the Suicide and Crisis Lifeline: 988 call the [place] National Suicide Prevention Lifeline: 251-036-7216 or TTY: (320) 408-2504 TTY 505-447-7497) to talk to a trained counselor call 1-800-273-TALK (toll free, 24 hour hotline) go to [facility] [address] [place] [phone]) call 911   Patient verbalizes understanding of instructions and care plan provided today and agrees to view in [place]. Active MyChart status and patient understanding of how to access instructions and care plan via MyChart confirmed with patient.     [name], LCSW Licensed Clinical Social Worker [facility]  [place]  [phone]

## 2021-08-27 NOTE — Chronic Care Management (AMB) (Signed)
Chronic Care Management   Clinical Social Work Note  08/27/2021 Name: [name] MRN: [id number] DOB: 05-25-1965  [name] is a 56 y.o. year old female who is a primary care patient of [name], MD. The CCM team was consulted to assist the patient with chronic disease management and/or care coordination needs related to:  connect with psychiatry .   Engaged with patient by telephone for follow up visit in response to provider referral for social work chronic care management and care coordination services.   Consent to Services:  The patient was given information about Chronic Care Management services, agreed to services, and gave verbal consent prior to initiation of services.  Please see initial visit note for detailed documentation.   Patient agreed to services and consent obtained.   Summary:  Patient is making progress with her mental health needs, she has a new patient appointment with Dr. Casimiro Needle as she did not like the care she was getting from previous provider.No new needs identified during this encounter  CCM LCSW collaborated with CCM RN  to assist with meeting patient's needs.  .  See Care Plan below for interventions and patient self-care actives.  Recommendation: Patient may benefit from, and is in agreement to keep Aug 8th appointment with Dr. Casimiro Needle for medication management.   No Follow up Scheduled:  All care plan goals have been met. Will disconnect from care team after this encounter. Patient has been informed to contact the office if new needs arise. Patient does not require continued follow-up by CCM LCSW. Will contact the office if needed   Assessment: Review of patient past medical history, allergies, medications, and health status, including review of relevant consultants reports was performed today as part of a comprehensive evaluation and provision of chronic care management and care coordination services.     SDOH (Social Determinants of Health)  assessments and interventions performed:    Advanced Directives Status: Not addressed in this encounter.  CCM Care Plan Conditions to be addressed/monitored: Depression;   Care Plan : LCSW Plan of Care  Updates made by Maurine Cane, LCSW since 08/27/2021 12:00 AM     Problem: Symptoms of Depression not managed      Goal: Symptoms Monitored and Managed with medication and therapy Completed 08/27/2021  Start Date: 03/20/2021  This Visit's Progress: On track  Recent Progress: On track  Priority: High  Note:   Current Barriers:  Disease Management support and education needs related to Depression: depressed mood loss of energy/fatigue difficulty concentrating  CSW Clinical Goal(s):  Patient  will work with mental health provider and LCSW to address needs related to symptoms of depression  through collaboration with Clinical Social Worker, provider, and care team.  Patient will work with LCSW to assist coordination of mental health needs  Interventions: 1:1 collaboration with primary care provider regarding development and update of comprehensive plan of care as evidenced by provider attestation and co-signature Inter-disciplinary care team collaboration (see longitudinal plan of care) Evaluation of current treatment plan related to  self management and patient's adherence to plan as established by provider  Mental Health:  (Status: Goal on Track (progressing): YES.) has appointment with Dr. Casimiro Needle at [place] no longer seeing Dr Adele Schilder at [facility] Evaluation of current treatment plan related to ADHD, Mood Instability, and symptoms of depression  Solution-Focused Strategies employed:  Active listening / Reflection utilized   Patient Self-Care Activities: call Dr. Carmelia Roller to schedule a dental appointment  [phone] Keep appointment with Dr. Casimiro Needle at [place] on Aug. 8th at 10:00 Utilize the information below  if you have a mental health crisis [facility]  [address] [place], [place]  [place], [place] Licensed Clinical Social Worker [facility]  [place]  [phone]

## 2021-09-24 DIAGNOSIS — E1165 Type 2 diabetes mellitus with hyperglycemia: Secondary | ICD-10-CM | POA: Diagnosis not present

## 2021-09-24 DIAGNOSIS — I5022 Chronic systolic (congestive) heart failure: Secondary | ICD-10-CM

## 2021-09-24 DIAGNOSIS — F332 Major depressive disorder, recurrent severe without psychotic features: Secondary | ICD-10-CM

## 2021-09-26 ENCOUNTER — Telehealth: Payer: Medicare Other

## 2021-10-05 DIAGNOSIS — F331 Major depressive disorder, recurrent, moderate: Secondary | ICD-10-CM | POA: Diagnosis not present

## 2021-10-28 ENCOUNTER — Other Ambulatory Visit: Payer: Self-pay | Admitting: Internal Medicine

## 2021-10-28 DIAGNOSIS — J301 Allergic rhinitis due to pollen: Secondary | ICD-10-CM

## 2021-10-29 ENCOUNTER — Other Ambulatory Visit: Payer: Self-pay | Admitting: Internal Medicine

## 2021-10-29 DIAGNOSIS — E1165 Type 2 diabetes mellitus with hyperglycemia: Secondary | ICD-10-CM

## 2021-10-30 ENCOUNTER — Other Ambulatory Visit: Payer: Self-pay | Admitting: Internal Medicine

## 2021-11-05 ENCOUNTER — Telehealth: Payer: Self-pay | Admitting: Cardiology

## 2021-11-05 NOTE — Telephone Encounter (Signed)
Returned the call to the patient. She wants to know if Dr. Percival Spanish can recommend a PCP for Aspergers. The patient's son has aged out of the pediatrician.

## 2021-11-05 NOTE — Telephone Encounter (Signed)
Patient wants to know if Dr. Percival Spanish has any recommendations for PCP doctor to see patient's son. Patient's son has asburgers

## 2021-11-07 ENCOUNTER — Other Ambulatory Visit: Payer: Self-pay | Admitting: Internal Medicine

## 2021-11-07 DIAGNOSIS — E1165 Type 2 diabetes mellitus with hyperglycemia: Secondary | ICD-10-CM

## 2021-11-07 DIAGNOSIS — I428 Other cardiomyopathies: Secondary | ICD-10-CM

## 2021-11-07 NOTE — Telephone Encounter (Signed)
The patient has been made aware:   Minus Breeding, MD  You 15 hours ago (5:33 PM)    I am afraid I do not know primary care doctors that specialize in Asperger's

## 2022-02-07 ENCOUNTER — Encounter: Payer: Self-pay | Admitting: Internal Medicine

## 2022-02-07 ENCOUNTER — Ambulatory Visit (INDEPENDENT_AMBULATORY_CARE_PROVIDER_SITE_OTHER): Payer: Medicare Other | Admitting: Internal Medicine

## 2022-02-07 VITALS — BP 162/116 | HR 87 | Temp 98.1°F | Resp 16 | Ht 64.0 in | Wt 315.0 lb

## 2022-02-07 DIAGNOSIS — F332 Major depressive disorder, recurrent severe without psychotic features: Secondary | ICD-10-CM | POA: Diagnosis not present

## 2022-02-07 DIAGNOSIS — I5022 Chronic systolic (congestive) heart failure: Secondary | ICD-10-CM | POA: Diagnosis not present

## 2022-02-07 DIAGNOSIS — I152 Hypertension secondary to endocrine disorders: Secondary | ICD-10-CM

## 2022-02-07 DIAGNOSIS — I428 Other cardiomyopathies: Secondary | ICD-10-CM | POA: Diagnosis not present

## 2022-02-07 DIAGNOSIS — Z23 Encounter for immunization: Secondary | ICD-10-CM | POA: Diagnosis not present

## 2022-02-07 DIAGNOSIS — E785 Hyperlipidemia, unspecified: Secondary | ICD-10-CM | POA: Diagnosis not present

## 2022-02-07 DIAGNOSIS — E1165 Type 2 diabetes mellitus with hyperglycemia: Secondary | ICD-10-CM

## 2022-02-07 DIAGNOSIS — E1159 Type 2 diabetes mellitus with other circulatory complications: Secondary | ICD-10-CM | POA: Diagnosis not present

## 2022-02-07 LAB — URINALYSIS, ROUTINE W REFLEX MICROSCOPIC
Bilirubin Urine: NEGATIVE
Ketones, ur: NEGATIVE
Leukocytes,Ua: NEGATIVE
Nitrite: NEGATIVE
Specific Gravity, Urine: 1.025 (ref 1.000–1.030)
Total Protein, Urine: NEGATIVE
Urine Glucose: 100 — AB
Urobilinogen, UA: 0.2 (ref 0.0–1.0)
pH: 6 (ref 5.0–8.0)

## 2022-02-07 LAB — BASIC METABOLIC PANEL
BUN: 18 mg/dL (ref 6–23)
CO2: 32 mEq/L (ref 19–32)
Calcium: 9.3 mg/dL (ref 8.4–10.5)
Chloride: 104 mEq/L (ref 96–112)
Creatinine, Ser: 0.72 mg/dL (ref 0.40–1.20)
GFR: 93.29 mL/min (ref >60.00)
Glucose, Bld: 110 mg/dL — ABNORMAL HIGH (ref 70–99)
Potassium: 4.1 mEq/L (ref 3.5–5.1)
Sodium: 140 mEq/L (ref 135–145)

## 2022-02-07 LAB — HEPATIC FUNCTION PANEL
ALT: 21 U/L (ref 0–35)
AST: 17 U/L (ref 0–37)
Albumin: 3.8 g/dL (ref 3.5–5.2)
Alkaline Phosphatase: 36 U/L — ABNORMAL LOW (ref 39–117)
Bilirubin, Direct: 0.1 mg/dL (ref 0.0–0.3)
Total Bilirubin: 0.5 mg/dL (ref 0.2–1.2)
Total Protein: 6.9 g/dL (ref 6.0–8.3)

## 2022-02-07 LAB — HEMOGLOBIN A1C: Hgb A1c MFr Bld: 7 % — ABNORMAL HIGH (ref 4.6–6.5)

## 2022-02-07 LAB — BRAIN NATRIURETIC PEPTIDE: Pro B Natriuretic peptide (BNP): 237 pg/mL — ABNORMAL HIGH (ref 0.0–100.0)

## 2022-02-07 MED ORDER — SIMVASTATIN 20 MG PO TABS: 20.0000 mg | ORAL_TABLET | Freq: Every day | ORAL | 1 refills | Status: DC

## 2022-02-07 NOTE — Patient Instructions (Signed)
Hypertension, Adult High blood pressure (hypertension) is when the force of blood pumping through the arteries is too strong. The arteries are the blood vessels that carry blood from the heart throughout the body. Hypertension forces the heart to work harder to pump blood and may cause arteries to become narrow or stiff. Untreated or uncontrolled hypertension can lead to a heart attack, heart failure, a stroke, kidney disease, and other problems. A blood pressure reading consists of a higher number over a lower number. Ideally, your blood pressure should be below 120/80. The first ("top") number is called the systolic pressure. It is a measure of the pressure in your arteries as your heart beats. The second ("bottom") number is called the diastolic pressure. It is a measure of the pressure in your arteries as the heart relaxes. What are the causes? The exact cause of this condition is not known. There are some conditions that result in high blood pressure. What increases the risk? Certain factors may make you more likely to develop high blood pressure. Some of these risk factors are under your control, including: Smoking. Not getting enough exercise or physical activity. Being overweight. Having too much fat, sugar, calories, or salt (sodium) in your diet. Drinking too much alcohol. Other risk factors include: Having a personal history of heart disease, diabetes, high cholesterol, or kidney disease. Stress. Having a family history of high blood pressure and high cholesterol. Having obstructive sleep apnea. Age. The risk increases with age. What are the signs or symptoms? High blood pressure may not cause symptoms. Very high blood pressure (hypertensive crisis) may cause: Headache. Fast or irregular heartbeats (palpitations). Shortness of breath. Nosebleed. Nausea and vomiting. Vision changes. Severe chest pain, dizziness, and seizures. How is this diagnosed? This condition is diagnosed by  measuring your blood pressure while you are seated, with your arm resting on a flat surface, your legs uncrossed, and your feet flat on the floor. The cuff of the blood pressure monitor will be placed directly against the skin of your upper arm at the level of your heart. Blood pressure should be measured at least twice using the same arm. Certain conditions can cause a difference in blood pressure between your right and left arms. If you have a high blood pressure reading during one visit or you have normal blood pressure with other risk factors, you may be asked to: Return on a different day to have your blood pressure checked again. Monitor your blood pressure at home for 1 week or longer. If you are diagnosed with hypertension, you may have other blood or imaging tests to help your health care provider understand your overall risk for other conditions. How is this treated? This condition is treated by making healthy lifestyle changes, such as eating healthy foods, exercising more, and reducing your alcohol intake. You may be referred for counseling on a healthy diet and physical activity. Your health care provider may prescribe medicine if lifestyle changes are not enough to get your blood pressure under control and if: Your systolic blood pressure is above 130. Your diastolic blood pressure is above 80. Your personal target blood pressure may vary depending on your medical conditions, your age, and other factors. Follow these instructions at home: Eating and drinking  Eat a diet that is high in fiber and potassium, and low in sodium, added sugar, and fat. An example of this eating plan is called the DASH diet. DASH stands for Dietary Approaches to Stop Hypertension. To eat this way: Eat   plenty of fresh fruits and vegetables. Try to fill one half of your plate at each meal with fruits and vegetables. Eat whole grains, such as whole-wheat pasta, brown rice, or whole-grain bread. Fill about one  fourth of your plate with whole grains. Eat or drink low-fat dairy products, such as skim milk or low-fat yogurt. Avoid fatty cuts of meat, processed or cured meats, and poultry with skin. Fill about one fourth of your plate with lean proteins, such as fish, chicken without skin, beans, eggs, or tofu. Avoid pre-made and processed foods. These tend to be higher in sodium, added sugar, and fat. Reduce your daily sodium intake. Many people with hypertension should eat less than 1,500 mg of sodium a day. Do not drink alcohol if: Your health care provider tells you not to drink. You are pregnant, may be pregnant, or are planning to become pregnant. If you drink alcohol: Limit how much you have to: 0-1 drink a day for women. 0-2 drinks a day for men. Know how much alcohol is in your drink. In the [place] one drink equals one 12 oz bottle of beer (355 mL), one 5 oz glass of wine (148 mL), or one 1 oz glass of hard liquor (44 mL). Lifestyle  Work with your health care provider to maintain a healthy body weight or to lose weight. Ask what an ideal weight is for you. Get at least 30 minutes of exercise that causes your heart to beat faster (aerobic exercise) most days of the week. Activities may include walking, swimming, or biking. Include exercise to strengthen your muscles (resistance exercise), such as Pilates or lifting weights, as part of your weekly exercise routine. Try to do these types of exercises for 30 minutes at least 3 days a week. Do not use any products that contain nicotine or tobacco. These products include cigarettes, chewing tobacco, and vaping devices, such as e-cigarettes. If you need help quitting, ask your health care provider. Monitor your blood pressure at home as told by your health care provider. Keep all follow-up visits. This is important. Medicines Take over-the-counter and prescription medicines only as told by your health care provider. Follow directions carefully. Blood  pressure medicines must be taken as prescribed. Do not skip doses of blood pressure medicine. Doing this puts you at risk for problems and can make the medicine less effective. Ask your health care provider about side effects or reactions to medicines that you should watch for. Contact a health care provider if you: Think you are having a reaction to a medicine you are taking. Have headaches that keep coming back (recurring). Feel dizzy. Have swelling in your ankles. Have trouble with your vision. Get help right away if you: Develop a severe headache or confusion. Have unusual weakness or numbness. Feel faint. Have severe pain in your chest or abdomen. Vomit repeatedly. Have trouble breathing. These symptoms may be an emergency. Get help right away. Call 911. Do not wait to see if the symptoms will go away. Do not drive yourself to the hospital. Summary Hypertension is when the force of blood pumping through your arteries is too strong. If this condition is not controlled, it may put you at risk for serious complications. Your personal target blood pressure may vary depending on your medical conditions, your age, and other factors. For most people, a normal blood pressure is less than 120/80. Hypertension is treated with lifestyle changes, medicines, or a combination of both. Lifestyle changes include losing weight, eating a healthy,   low-sodium diet, exercising more, and limiting alcohol. This information is not intended to replace advice given to you by your health care provider. Make sure you discuss any questions you have with your health care provider. Document Revised: 12/19/2020 Document Reviewed: 12/19/2020 Elsevier Patient Education  2023 Elsevier Inc.  

## 2022-02-07 NOTE — Progress Notes (Signed)
Hughes Better, CMA did witness conversation today during patient's visit with Dr. Ronnald Ramp. Due to patient being high-risk and currently suicidal (with thoughts of harming herself ) was recommend to go to the ED today via Ambulance . This was recommended for Behavioral Health to get her the necessary help needed. Patient declined to go today due to past experiences. Dr. Ronnald Ramp did stress the importance of her going today to get the help that she needed and patient. In front of husband  patient has declined again to not go.

## 2022-02-07 NOTE — Progress Notes (Signed)
Subjective:  Patient ID: [name], female    DOB: [birth year]  Age: 56 y.o. MRN: [id number]  CC: Diabetes, Hypertension, Hyperlipidemia, Depression, Osteoarthritis, and Back Pain   HPI [name] presents for f/up -  She continues to complain of signs and symptoms of depression.  She feels worthless, helpless, and hopeless.  She thinks about dying every day but she would never commit suicide because she takes care of her son.  She she has seen multiple psychiatrist.  She saw 1 name Dr. Parke Poisson whom she liked but she did not like the meds he prescribed so she is seeing several other psychiatrist.  She said she has never made a connection with psychiatrist because she does not feel like they prescribe the right antidepressants or hear her story.  She continues to complain of anhedonia and said she does not leave her house.  Her husband is with her today.  She is not taking any antidepressants and has not been compliant with any of her other medications.  She denies chest pain, shortness of breath, diaphoresis, or edema.  Outpatient Medications Prior to Visit  Medication Sig Dispense Refill   atomoxetine (STRATTERA) 18 MG capsule Take 1 capsule (18 mg total) by mouth daily. 90 capsule 1   carvedilol (COREG) 25 MG tablet Take 1 tablet (25 mg total) by mouth 2 (two) times daily with a meal. 180 tablet 3   fluticasone (FLONASE) 50 MCG/ACT nasal spray SPRAY 2 SPRAYS INTO EACH NOSTRIL EVERY DAY 48 mL 1   furosemide (LASIX) 80 MG tablet Take 1 tablet (80 mg total) by mouth daily. 90 tablet 3   linaclotide (LINZESS) 145 MCG CAPS capsule TAKE 1 CAPSULE BY MOUTH DAILY BEFORE BREAKFAST. 90 capsule 1   nystatin (MYCOSTATIN/NYSTOP) powder Apply 1 application topically 3 (three) times daily. 120 g 3   potassium chloride SA (KLOR-CON M20) 20 MEQ tablet Take 2 tablets (40 mEq total) by mouth daily. 180 tablet 3   sacubitril-valsartan (ENTRESTO) 97-103 MG Take 1 tablet by mouth 2 (two) times daily. 180 tablet 3    vortioxetine HBr (TRINTELLIX) 20 MG TABS tablet Take one tablet by mouth (20 mg total) daily. 90 tablet 1   XIGDUO XR 11-998 MG TB24 TAKE 1 TABLET BY MOUTH EVERY DAY 90 tablet 1   LORazepam (ATIVAN) 0.5 MG tablet Take 1 tablet (0.5 mg total) by mouth daily as needed for anxiety. 20 tablet 0   simvastatin (ZOCOR) 20 MG tablet TAKE 1 TABLET BY MOUTH EVERY DAY 90 tablet 1   No facility-administered medications prior to visit.    ROS Review of Systems  Constitutional:  Positive for fatigue and unexpected weight change (wt gain). Negative for appetite change, chills and diaphoresis.  HENT: Negative.    Eyes: Negative.   Respiratory:  Negative for cough, chest tightness, shortness of breath and wheezing.   Cardiovascular:  Negative for chest pain, palpitations and leg swelling.  Gastrointestinal:  Negative for abdominal pain, constipation, diarrhea, nausea and vomiting.  Endocrine: Negative.  Negative for polyuria.  Musculoskeletal:  Positive for arthralgias and back pain. Negative for myalgias.  Neurological: Negative.  Negative for dizziness, weakness and headaches.  Hematological:  Negative for adenopathy. Does not bruise/bleed easily.  Psychiatric/Behavioral:  Positive for decreased concentration, dysphoric mood, sleep disturbance and suicidal ideas. Negative for agitation, confusion and self-injury. The patient is nervous/anxious.     Objective:  BP (!) 162/116 (BP Location: Right Arm, Patient Position: Sitting, Cuff Size: Large)   Pulse 87  Temp 98.1 F (36.7 C) (Oral)   Resp 16   Ht '5\' 4"'$  (1.626 m)   Wt (!) 315 lb (142.9 kg)   SpO2 93%   BMI 54.07 kg/m   BP Readings from Last 3 Encounters:  02/07/22 (!) 162/116  08/09/21 132/74  08/01/21 135/86    Wt Readings from Last 3 Encounters:  02/07/22 (!) 315 lb (142.9 kg)  08/09/21 (!) 310 lb (140.6 kg)  08/01/21 (!) 307 lb (139.3 kg)    Physical Exam Vitals reviewed. Exam conducted with a chaperone present Angela Nevin).   Constitutional:      General: She is not in acute distress.    Appearance: She is obese. She is ill-appearing. She is not toxic-appearing or diaphoretic.  HENT:     Mouth/Throat:     Mouth: Mucous membranes are moist.  Eyes:     General: No scleral icterus.    Conjunctiva/sclera: Conjunctivae normal.  Neck:     Vascular: No carotid bruit.  Cardiovascular:     Rate and Rhythm: Normal rate and regular rhythm.     Heart sounds: No murmur heard.    Comments: EKG-- NSR, 85 bpm LAD, LVH No Q waves or ST/T waves changes Pulmonary:     Effort: Pulmonary effort is normal.     Breath sounds: No stridor. No wheezing, rhonchi or rales.  Abdominal:     General: Abdomen is protuberant. Bowel sounds are normal. There is no distension.     Palpations: Abdomen is soft. There is no hepatomegaly, splenomegaly or mass.     Tenderness: There is no abdominal tenderness.  Musculoskeletal:        General: Normal range of motion.     Cervical back: Neck supple.     Right lower leg: No edema.     Left lower leg: No edema.  Lymphadenopathy:     Cervical: No cervical adenopathy.  Skin:    General: Skin is warm and dry.  Neurological:     General: No focal deficit present.     Mental Status: She is alert. Mental status is at baseline.  Psychiatric:        Attention and Perception: Perception normal. She is inattentive.        Mood and Affect: Mood is anxious and depressed. Affect is blunt and tearful. Affect is not angry or inappropriate.        Speech: She is communicative. Speech is tangential. Speech is not rapid and pressured, delayed or slurred.        Behavior: Behavior is not agitated, slowed, aggressive, withdrawn, hyperactive or combative.        Thought Content: Thought content is not paranoid or delusional. Thought content includes suicidal ideation. Thought content does not include homicidal ideation. Thought content does not include homicidal plan.        Cognition and Memory: Cognition  normal.        Judgment: Judgment normal.     Lab Results  Component Value Date   WBC 8.7 08/09/2021   HGB 15.9 (H) 08/09/2021   HCT 48.5 (H) 08/09/2021   PLT 241.0 08/09/2021   GLUCOSE 110 (H) 02/07/2022   CHOL 176 03/01/2021   TRIG 110.0 03/01/2021   HDL 67.60 03/01/2021   LDLDIRECT 143.8 04/03/2011   LDLCALC 86 03/01/2021   ALT 21 02/07/2022   AST 17 02/07/2022   NA 140 02/07/2022   K 4.1 02/07/2022   CL 104 02/07/2022   CREATININE 0.72 02/07/2022   BUN 18  02/07/2022   CO2 32 02/07/2022   TSH 4.88 08/09/2021   INR 1.0 04/08/2016   HGBA1C 7.0 (H) 02/07/2022   MICROALBUR 1.6 03/01/2021    No results found.  Assessment & Plan:   [name] was seen today for diabetes, hypertension, hyperlipidemia, depression, osteoarthritis and back pain.  Diagnoses and all orders for this visit:  Chronic systolic CHF (congestive heart failure), NYHA class 3 ([place])- Her EKG is reassuring.  Her BNP is slightly elevated.  On exam she has a normal volume status. -     EKG 12-Lead -     Brain natriuretic peptide; Future -     Brain natriuretic peptide  Hypertension associated with diabetes ([place])- Her blood pressure is not controlled due to noncompliance. -     EKG 12-Lead -     Urinalysis, Routine w reflex microscopic; Future -     Basic metabolic panel; Future -     Hepatic function panel; Future -     Hepatic function panel -     Basic metabolic panel -     Urinalysis, Routine w reflex microscopic  NICM (nonischemic cardiomyopathy) (HCC)  Type 2 diabetes mellitus with hyperglycemia, without long-term current use of insulin ([place])- Her blood sugar is adequately well-controlled. -     Hemoglobin A1c; Future -     Basic metabolic panel; Future -     Basic metabolic panel -     Hemoglobin A1c  Hyperlipidemia LDL goal <100 -     simvastatin (ZOCOR) 20 MG tablet; Take 1 tablet (20 mg total) by mouth daily.  Severe episode of recurrent major depressive disorder, without psychotic  features ([place])- She is passively suicidal and neglectful.  I recommended that she go to the ED via EMS to be triaged by an emergency psychiatric team.  She refuses to do this.  I had a conversation with her, her husband, and Clayborne Dana to document that it was my recommendation that she be seen urgently today. -     Ambulatory referral to Psychiatry  Other orders -     Flu Vaccine QUAD 6+ mos PF IM (Fluarix Quad PF)   I have discontinued Merla Degeorge's LORazepam. I have also changed her simvastatin. Additionally, I am having her maintain her potassium chloride SA, furosemide, sacubitril-valsartan, nystatin, carvedilol, vortioxetine HBr, linaclotide, atomoxetine, fluticasone, and Xigduo XR.  Meds ordered this encounter  Medications   simvastatin (ZOCOR) 20 MG tablet    Sig: Take 1 tablet (20 mg total) by mouth daily.    Dispense:  90 tablet    Refill:  1     Follow-up: Return in about 6 weeks (around 03/21/2022).  [name], MD

## 2022-02-10 ENCOUNTER — Other Ambulatory Visit: Payer: Self-pay | Admitting: Internal Medicine

## 2022-02-10 DIAGNOSIS — I5022 Chronic systolic (congestive) heart failure: Secondary | ICD-10-CM

## 2022-02-10 DIAGNOSIS — Z1231 Encounter for screening mammogram for malignant neoplasm of breast: Secondary | ICD-10-CM

## 2022-02-10 DIAGNOSIS — I428 Other cardiomyopathies: Secondary | ICD-10-CM

## 2022-02-13 ENCOUNTER — Ambulatory Visit: Payer: Self-pay | Admitting: Licensed Clinical Social Worker

## 2022-02-13 NOTE — Progress Notes (Unsigned)
Cardiology Office Note   Date:  02/14/2022   ID:  [name], DOB Jul 21, 1965, MRN [id number]  PCP:  [name], MD  Cardiologist:   Minus Breeding, MD    Chief Complaint  Patient presents with   Cardiomyopathy     History of Present Illness: [name] is a 56 y.o. female who initially seen by me in 2016 for initial sleep clinic evaluation after initiation of CPAP therapy for obstructive sleep apnea. She had cath 04/11/16. This revealed normal coronaries, EF 25-30%, mild pulmonary HTN, and elevated LVEDP.  She was in the hospital in August 2018.  She had acute on chronic systolic and diastolic HF. She presents for follow up.   EF 12/2017 was 30 - 35%.  Her most recent echo demonstrated the EF to be 50 - 55% in Nov.    She called recently.  Unfortunately she is had severe problems with depression.  She has not been leaving her home.  She has not been taking her medications.  She has had weight gain and increased dyspnea.  We left her stone instructions recently on how to restart her medications since she been off them for a while.  She is currently taking them as listed below.  We did follow-up with some blood work recently and her thyroid electrolytes and CBC were essentially normal.  She presents for office follow-up.  She had become depressed and discontinued her meds but we restarted them and have titrated them upward.  Since I last saw her  she has no new cardiac complaints.  The patient denies any new symptoms such as chest discomfort, neck or arm discomfort. There has been no new shortness of breath, PND or orthopnea. There have been no reported palpitations, presyncope or syncope.   Her biggest issue remains depression.   Past Medical History:  Diagnosis Date   ADD (attention deficit disorder)    Anxiety    Arthritis    both knees right worse than left   Carpal tunnel syndrome of right wrist    Depression    Essential hypertension, benign    Gallstones    History of  smoking 06/22/2016   Hyperlipidemia associated with type 2 diabetes mellitus ([place]), on Zocor 04/04/2011   Hypertension associated with diabetes ([place]) 06/03/2008   Migraines    Morbid obesity ([place])    Morbid obesity with BMI of 50.0-59.9, adult ([place]) 07/12/2006   NICM (nonischemic cardiomyopathy) ([place]) 07/12/2006   01/16/18 ECHO:    - Procedure narrative: Transthoracic echocardiography. Image   quality was suboptimal. The study was technically difficult.   Intravenous contrast (Definity) was administered. - Left ventricle: The cavity size was moderately dilated. Wall   thickness was increased in a pattern of mild LVH. Systolic   function was moderately to severely reduced. The estimated   ejection fraction w   Normal coronary arteries 04/17/2016   OSA on CPAP 10/28/2014   Spinal stenosis    back pain   Spondylosis without myelopathy or radiculopathy, lumbar region 12/04/2017   Type 2 diabetes mellitus with hyperglycemia [facility])     Past Surgical History:  Procedure Laterality Date   CHOLECYSTECTOMY     DILATATION & CURETTAGE/HYSTEROSCOPY WITH MYOSURE N/A 10/31/2017   Procedure: DILATATION & CURETTAGE/HYSTEROSCOPY WITH MYOSURE;  Surgeon: Salvadore Dom, MD;  Location: [facility];  Service: Gynecology;  Laterality: N/A;   RIGHT/LEFT HEART CATH AND CORONARY ANGIOGRAPHY N/A 04/11/2016   Procedure: Right/Left Heart Cath and Coronary Angiography;  Surgeon: Harrell Gave  Santina Evans, MD;  Location: [facility];  Service: Cardiovascular;  Laterality: N/A;   sonogram for blood clots     no blockages     Current Outpatient Medications  Medication Sig Dispense Refill   carvedilol (COREG) 25 MG tablet Take 1 tablet (25 mg total) by mouth 2 (two) times daily with a meal. 180 tablet 3   fluticasone (FLONASE) 50 MCG/ACT nasal spray SPRAY 2 SPRAYS INTO EACH NOSTRIL EVERY DAY 48 mL 1   furosemide (LASIX) 80 MG tablet Take 1 tablet (80 mg total) by mouth daily. 90 tablet 3   potassium chloride SA (KLOR-CON M20)  20 MEQ tablet Take 2 tablets (40 mEq total) by mouth daily. 180 tablet 3   sacubitril-valsartan (ENTRESTO) 97-103 MG Take 1 tablet by mouth 2 (two) times daily. 180 tablet 3   simvastatin (ZOCOR) 20 MG tablet Take 1 tablet (20 mg total) by mouth daily. 90 tablet 1   XIGDUO XR 11-998 MG TB24 TAKE 1 TABLET BY MOUTH EVERY DAY 90 tablet 1   atomoxetine (STRATTERA) 18 MG capsule Take 1 capsule (18 mg total) by mouth daily. (Patient not taking: Reported on 02/14/2022) 90 capsule 1   linaclotide (LINZESS) 145 MCG CAPS capsule TAKE 1 CAPSULE BY MOUTH DAILY BEFORE BREAKFAST. (Patient not taking: Reported on 02/14/2022) 90 capsule 1   nystatin (MYCOSTATIN/NYSTOP) powder Apply 1 application topically 3 (three) times daily. (Patient not taking: Reported on 02/14/2022) 120 g 3   vortioxetine HBr (TRINTELLIX) 20 MG TABS tablet Take one tablet by mouth (20 mg total) daily. (Patient not taking: Reported on 02/14/2022) 90 tablet 1   No current facility-administered medications for this visit.    Allergies:   Fluoxetine, Cymbalta [duloxetine hcl], and Penicillins    ROS:  Please see the history of present illness.   Otherwise, review of systems are positive for chronic back pain.   All other systems are reviewed and negative.    PHYSICAL EXAM: VS:  BP 109/75   Pulse 93   Ht '5\' 4"'$  (1.626 m)   Wt (!) 306 lb 9.6 oz (139.1 kg)   SpO2 94%   BMI 52.63 kg/m  , BMI Body mass index is 52.63 kg/m. GENERAL:  Well appearing NECK:  No jugular venous distention, waveform within normal limits, carotid upstroke brisk and symmetric, no bruits, no thyromegaly LUNGS:  Clear to auscultation bilaterally CHEST:  Unremarkable HEART:  PMI not displaced or sustained,S1 and S2 within normal limits, no S3, no S4, no clicks, no rubs, no murmurs ABD:  Flat, positive bowel sounds normal in frequency in pitch, no bruits, no rebound, no guarding, no midline pulsatile mass, no hepatomegaly, no splenomegaly EXT:  2 plus pulses  throughout, no edema, no cyanosis no clubbing   EKG:  EKG is not ordered today. NA   Recent Labs: 08/09/2021: Hemoglobin 15.9; Platelets 241.0; TSH 4.88 02/07/2022: ALT 21; BUN 18; Creatinine, Ser 0.72; Potassium 4.1; Pro B Natriuretic peptide (BNP) 237.0; Sodium 140    Lipid Panel    Component Value Date/Time   CHOL 176 03/01/2021 1530   TRIG 110.0 03/01/2021 1530   HDL 67.60 03/01/2021 1530   CHOLHDL 3 03/01/2021 1530   VLDL 22.0 03/01/2021 1530   LDLCALC 86 03/01/2021 1530   LDLDIRECT 143.8 04/03/2011 0929      Wt Readings from Last 3 Encounters:  02/14/22 (!) 306 lb 9.6 oz (139.1 kg)  02/07/22 (!) 315 lb (142.9 kg)  08/09/21 (!) 310 lb (140.6 kg)  Other studies Reviewed: Additional studies/ records that were reviewed today include: Labs Review of the above records demonstrates:    See elsewhere   ASSESSMENT AND PLAN:   DYSPNEA -  The patient does not report any change in her breathing.  She does have a mildly reduced ejection fraction.  She is on targeted therapy.  No change in management for this nonischemic cardiomyopathy.   CHRONIC SYSTOLIC HF -  As above.  She will continue current therapy.  ESSENTIAL HYPERTENSION, BENIGN -  The blood pressure is controlled in the context of managing her cardiomyopathy.   DEPRESSION - She does not describe suicidal ideation but she is severely depressed.  I think she will probably need inpatient management.  I have suggested that she go to the emergency room for admission and she will consider this.   DM -  Per [name], MD her last A1c was 7.0   Current medicines are reviewed at length with the patient today.  The patient does not have concerns regarding medicines.  The following changes have been made:  None  Labs/ tests ordered today include:   No orders of the defined types were placed in this encounter.     Disposition:   FU with APP in 6 months.    Signed, Minus Breeding, MD  02/14/2022  2:13 PM    [facility]

## 2022-02-13 NOTE — Patient Instructions (Signed)
Visit Information  Thank you for taking time to visit with me today. Please don't hesitate to contact me if I can be of assistance to you.   Following are the goals we discussed today:   Goals Addressed             This Visit's Progress    Care Coordination Activities for mental health support       Care Coordination Interventions:   Solution-Focused Strategies employed:  Active listening / Reflection utilized  Provided general psycho-education for mental health needs  Suicidal Ideation/Homicidal Ideation assessed:   Discussed referral for psychiatry:   Made referral to [facility] for medication management and therapy            Our next appointment is by telephone on 02/19/22 at 2:00  Please call the care guide team at [phone] if you need to cancel or reschedule your appointment.   If you are experiencing a Mental Health or [place] or need someone to talk to, please call the Suicide and Crisis Lifeline: 988 call the [place] National Suicide Prevention Lifeline: 912 533 1410 or TTY: 340-469-5078 TTY (570) 422-4908) to talk to a trained counselor call 1-800-273-TALK (toll free, 24 hour hotline) go to [facility] [address] [place] [phone])   Patient verbalizes understanding of instructions and care plan provided today and agrees to view in [place]. Active MyChart status and patient understanding of how to access instructions and care plan via MyChart confirmed with patient.     [name], [place] [phone]

## 2022-02-13 NOTE — Patient Outreach (Signed)
  Care Coordination  Initial Visit Note   02/13/2022 Name: [name] MRN: [id number] DOB: 10-31-1965  [name] is a 56 y.o. year old female who sees Samantha Lima, MD for primary care. I spoke with  [name] by phone today.  What matters to the patients health and wellness today?  Managing her mental health    Goals Addressed             This Visit's Progress    Care Coordination Activities for mental health support       Care Coordination Interventions:   Solution-Focused Strategies employed:  Active listening / Reflection utilized  Provided general psycho-education for mental health needs  Suicidal Ideation/Homicidal Ideation assessed:   Discussed referral for psychiatry:   Made referral to [facility] for medication management and therapy            SDOH assessments and interventions completed:  No   Care Coordination Interventions:  Yes, provided   Follow up plan: Follow up call scheduled for 02/19/22    Encounter Outcome:  Pt. Visit Completed {THN Tip this will not be part of the note when signed-REQUIRED REPORT FIELD DO NOT DELETE (Optional):[id number]  [name], [place] [phone]

## 2022-02-14 ENCOUNTER — Ambulatory Visit: Payer: Medicare Other | Attending: Cardiology | Admitting: Cardiology

## 2022-02-14 ENCOUNTER — Encounter: Payer: Self-pay | Admitting: Cardiology

## 2022-02-14 VITALS — BP 109/75 | HR 93 | Ht 64.0 in | Wt 306.6 lb

## 2022-02-14 DIAGNOSIS — R0602 Shortness of breath: Secondary | ICD-10-CM | POA: Insufficient documentation

## 2022-02-14 DIAGNOSIS — I10 Essential (primary) hypertension: Secondary | ICD-10-CM

## 2022-02-14 DIAGNOSIS — F32A Depression, unspecified: Secondary | ICD-10-CM | POA: Diagnosis not present

## 2022-02-14 DIAGNOSIS — I5022 Chronic systolic (congestive) heart failure: Secondary | ICD-10-CM | POA: Diagnosis not present

## 2022-02-14 NOTE — Patient Instructions (Signed)
Medication Instructions:  Continue same medications *If you need a refill on your cardiac medications before your next appointment, please call your pharmacy*   Lab Work: None ordered   Testing/Procedures: None ordered   Follow-Up: At [facility], you and your health needs are our priority.  As part of our continuing mission to provide you with exceptional heart care, we have created designated Provider Care Teams.  These Care Teams include your primary Cardiologist (physician) and Advanced Practice Providers (APPs -  Physician Assistants and Nurse Practitioners) who all work together to provide you with the care you need, when you need it.  We recommend signing up for the patient portal called "MyChart".  Sign up information is provided on this After Visit Summary.  MyChart is used to connect with patients for Virtual Visits (Telemedicine).  Patients are able to view lab/test results, encounter notes, upcoming appointments, etc.  Non-urgent messages can be sent to your provider as well.   To learn more about what you can do with MyChart, go to [email or web address].    Your next appointment: 6 months Call in March to schedule June appointment      The format for your next appointment: Office     Provider:  Almyra Deforest PA    Important Information About [place]

## 2022-02-19 ENCOUNTER — Ambulatory Visit: Payer: Self-pay | Admitting: Licensed Clinical Social Worker

## 2022-02-19 NOTE — Patient Outreach (Signed)
  Care Coordination  Follow Up Visit Note   02/19/2022 Name: [name] MRN: [id number] DOB: 10/01/1965  [name] is a 56 y.o. year old female who sees Samantha Lima, MD for primary care. I spoke with  [name] by phone today.  What matters to the patients health and wellness today?  Would like to reschedule appointment for tomorrow. Reports no one for [facility] has contacted her to schedule appointment for mental health.    Goals Addressed             This Visit's Progress    Care Coordination Activities for mental health support       Care Coordination Interventions:   Solution-Focused Strategies employed:  Active listening / Reflection utilized  Provided general psycho-education for mental health needs  Suicidal Ideation/Homicidal Ideation assessed:   Discussed referral for psychiatry:   Made referral to [facility] for medication management and therapy  F/u call made to [facility]. They have not been able to reach patient. Left voice message. Outreach calls to locate dentist for patient that take her insurance           SDOH assessments and interventions completed:  No    Care Coordination Interventions:  Yes, provided   Follow up plan: Follow up call scheduled for 02/19/22    Encounter Outcome:  Pt. Visit Completed   [name], [place] [phone]

## 2022-02-19 NOTE — Patient Instructions (Signed)
    It was a pleasure speaking with you today. I am sorry you were unable to keep your phone appointment today.   Per your request a Care Coordination phone appointment is scheduled 02/20/22  [name], [place] [phone]

## 2022-02-20 ENCOUNTER — Ambulatory Visit: Payer: Self-pay | Admitting: Licensed Clinical Social Worker

## 2022-02-20 ENCOUNTER — Telehealth (HOSPITAL_COMMUNITY): Payer: Self-pay | Admitting: *Deleted

## 2022-02-20 NOTE — Patient Instructions (Signed)
Visit Information  Thank you for taking time to visit with me today. Please don't hesitate to contact me if I can be of assistance to you.   Following are the goals we discussed today:   Goals Addressed             This Visit's Progress    Care Coordination Activities for mental health support       Care Coordination Interventions:   Solution-Focused Strategies employed:  Active listening / Reflection utilized  Provided general psycho-education for mental health needs  Suicidal Ideation/Homicidal Ideation assessed:   Discussed referral for psychiatry: would like to go back to previous provider Dr. Parke Poisson called but not taking new patients Made referral to [facility] for medication management and therapy  F/u call to [place] with patient on the line to get appointment scheduled  initial appointment is Jan. 3rd 4:45 Discussed need for a dentist and connecting with OBGYN           Our next appointment is by telephone on 03/05/22 at 3:00  Please call the care guide team at [phone] if you need to cancel or reschedule your appointment.   If you are experiencing a Mental Health or [place] or need someone to talk to, please call the Suicide and Crisis Lifeline: 988 call the [place] National Suicide Prevention Lifeline: 408-868-5519 or TTY: (585) 793-0282 TTY (680)831-3747) to talk to a trained counselor call 1-800-273-TALK (toll free, 24 hour hotline) go to [facility] [place] [phone])   The patient verbalized understanding of instructions, educational materials, and care plan provided today and DECLINED offer to receive copy of patient instructions, educational materials, and care plan.    [name], [place] [phone]

## 2022-02-20 NOTE — Patient Outreach (Signed)
  Care Coordination  Follow Up Visit Note   02/20/2022 Name: [name] MRN: [id number] DOB: Dec 29, 1965  [name] is a 56 y.o. year old female who sees [name], MD for primary care. I spoke with  [name] by phone today.  What matters to the patients health and wellness today?  Managing her health needs  Patient continues to experience difficulty with managing symptom of depression and connecting to a provider that will meet her needs. Also needs other health maintenance ( dental and OBGYN)  Recommendation: Patient may benefit from, and is in agreement to : to keep initial psychiatry appointment with [facility] on Jan. 3rd at 4:45. .     Goals Addressed             This Visit's Progress    Care Coordination Activities for mental health support       Care Coordination Interventions:   Solution-Focused Strategies employed:  Active listening / Reflection utilized  Provided general psycho-education for mental health needs  Suicidal Ideation/Homicidal Ideation assessed:   Discussed referral for psychiatry: would like to go back to previous provider Dr. Parke Poisson called but not taking new patients Made referral to [facility] for medication management and therapy  F/u call to [place] with patient on the line to get appointment scheduled  initial appointment is Jan. 3rd 4:45 Discussed need for a dentist and connecting with OBGYN           SDOH assessments and interventions completed:  No    Care Coordination Interventions:  Yes, provided   Follow up plan: Follow up call scheduled for 03/05/22    Encounter Outcome:  Pt. Visit Completed   [name], [place] [phone]

## 2022-02-20 NOTE — Telephone Encounter (Signed)
Patient called stated she's a former patient & when asked can I ask what's this concerning -- she stated it's really personal  & she just rather speak to Dr Parke Poisson     02/21/2022  Phone documentation   Informed Ms. Macari contacted office , asked to speak with me.   Ms . Gashi  states that she has been feeling depressed and that  her PCP office has  referred her for psychiatric outpatient management : she has an upcoming appointment at [facility] in early January . She states she plans to keep this appointment , but  inquired if I could start seeing her for outpatient psychiatric management.   I informed her that I am unfortunately  leaving the clinic soon Therapist, sports is leaving clinic in February ) . She expressed understanding   She  reports she has been feeling depressed. She denies having suicidal ideations and described  love for her son as a protective factor -  explained that  ( son/husband ) " need me  with them ".   I reviewed the importance of keeping her scheduled appointment ,  and reviewed safety recommendation/ that she should go to ED if any SI or other safety concerns .Also reviewed that [facility] in downtown [place] is available at any time if needed.    She did express some interest in IOP level of care and (with her express consent ) I informed CMA for IOP referral.  F Cobos MD

## 2022-02-25 ENCOUNTER — Inpatient Hospital Stay (HOSPITAL_COMMUNITY): Payer: Medicare Other

## 2022-02-25 ENCOUNTER — Emergency Department (HOSPITAL_COMMUNITY): Payer: Medicare Other

## 2022-02-25 ENCOUNTER — Encounter (HOSPITAL_COMMUNITY): Payer: Self-pay | Admitting: Pharmacy Technician

## 2022-02-25 ENCOUNTER — Other Ambulatory Visit: Payer: Self-pay

## 2022-02-25 ENCOUNTER — Inpatient Hospital Stay (HOSPITAL_COMMUNITY)
Admission: EM | Admit: 2022-02-25 | Discharge: 2022-02-27 | DRG: 872 | Disposition: A | Discharge status: Home / Self Care | Payer: Medicare Other | Attending: Internal Medicine | Admitting: Internal Medicine

## 2022-02-25 DIAGNOSIS — F329 Major depressive disorder, single episode, unspecified: Secondary | ICD-10-CM | POA: Diagnosis present

## 2022-02-25 DIAGNOSIS — E876 Hypokalemia: Secondary | ICD-10-CM | POA: Diagnosis present

## 2022-02-25 DIAGNOSIS — R0902 Hypoxemia: Secondary | ICD-10-CM | POA: Diagnosis not present

## 2022-02-25 DIAGNOSIS — N179 Acute kidney failure, unspecified: Secondary | ICD-10-CM | POA: Diagnosis present

## 2022-02-25 DIAGNOSIS — N83202 Unspecified ovarian cyst, left side: Secondary | ICD-10-CM | POA: Diagnosis present

## 2022-02-25 DIAGNOSIS — A4151 Sepsis due to Escherichia coli [E. coli]: Secondary | ICD-10-CM | POA: Diagnosis present

## 2022-02-25 DIAGNOSIS — N926 Irregular menstruation, unspecified: Secondary | ICD-10-CM | POA: Diagnosis present

## 2022-02-25 DIAGNOSIS — R262 Difficulty in walking, not elsewhere classified: Secondary | ICD-10-CM | POA: Diagnosis not present

## 2022-02-25 DIAGNOSIS — Z87891 Personal history of nicotine dependence: Secondary | ICD-10-CM | POA: Diagnosis not present

## 2022-02-25 DIAGNOSIS — R000 Tachycardia, unspecified: Secondary | ICD-10-CM | POA: Diagnosis not present

## 2022-02-25 DIAGNOSIS — I152 Hypertension secondary to endocrine disorders: Secondary | ICD-10-CM | POA: Diagnosis present

## 2022-02-25 DIAGNOSIS — R6521 Severe sepsis with septic shock: Secondary | ICD-10-CM | POA: Diagnosis present

## 2022-02-25 DIAGNOSIS — Z6841 Body Mass Index (BMI) 40.0 and over, adult: Secondary | ICD-10-CM | POA: Diagnosis not present

## 2022-02-25 DIAGNOSIS — E1165 Type 2 diabetes mellitus with hyperglycemia: Secondary | ICD-10-CM | POA: Diagnosis present

## 2022-02-25 DIAGNOSIS — R6520 Severe sepsis without septic shock: Secondary | ICD-10-CM | POA: Diagnosis not present

## 2022-02-25 DIAGNOSIS — N83201 Unspecified ovarian cyst, right side: Secondary | ICD-10-CM | POA: Diagnosis present

## 2022-02-25 DIAGNOSIS — I428 Other cardiomyopathies: Secondary | ICD-10-CM | POA: Diagnosis present

## 2022-02-25 DIAGNOSIS — G4733 Obstructive sleep apnea (adult) (pediatric): Secondary | ICD-10-CM | POA: Diagnosis present

## 2022-02-25 DIAGNOSIS — Z1152 Encounter for screening for COVID-19: Secondary | ICD-10-CM | POA: Diagnosis not present

## 2022-02-25 DIAGNOSIS — M549 Dorsalgia, unspecified: Secondary | ICD-10-CM | POA: Diagnosis present

## 2022-02-25 DIAGNOSIS — T501X6A Underdosing of loop [high-ceiling] diuretics, initial encounter: Secondary | ICD-10-CM | POA: Diagnosis present

## 2022-02-25 DIAGNOSIS — A419 Sepsis, unspecified organism: Secondary | ICD-10-CM | POA: Diagnosis not present

## 2022-02-25 DIAGNOSIS — Z91128 Patient's intentional underdosing of medication regimen for other reason: Secondary | ICD-10-CM

## 2022-02-25 DIAGNOSIS — E785 Hyperlipidemia, unspecified: Secondary | ICD-10-CM | POA: Diagnosis present

## 2022-02-25 DIAGNOSIS — K760 Fatty (change of) liver, not elsewhere classified: Secondary | ICD-10-CM | POA: Diagnosis not present

## 2022-02-25 DIAGNOSIS — G8929 Other chronic pain: Secondary | ICD-10-CM | POA: Diagnosis present

## 2022-02-25 DIAGNOSIS — R0602 Shortness of breath: Secondary | ICD-10-CM | POA: Diagnosis not present

## 2022-02-25 DIAGNOSIS — N390 Urinary tract infection, site not specified: Secondary | ICD-10-CM | POA: Diagnosis present

## 2022-02-25 DIAGNOSIS — I5022 Chronic systolic (congestive) heart failure: Secondary | ICD-10-CM | POA: Diagnosis not present

## 2022-02-25 DIAGNOSIS — R319 Hematuria, unspecified: Secondary | ICD-10-CM | POA: Diagnosis not present

## 2022-02-25 DIAGNOSIS — R160 Hepatomegaly, not elsewhere classified: Secondary | ICD-10-CM | POA: Diagnosis not present

## 2022-02-25 DIAGNOSIS — Z880 Allergy status to penicillin: Secondary | ICD-10-CM

## 2022-02-25 DIAGNOSIS — E1169 Type 2 diabetes mellitus with other specified complication: Secondary | ICD-10-CM | POA: Diagnosis present

## 2022-02-25 DIAGNOSIS — Z79899 Other long term (current) drug therapy: Secondary | ICD-10-CM

## 2022-02-25 DIAGNOSIS — Z888 Allergy status to other drugs, medicaments and biological substances status: Secondary | ICD-10-CM

## 2022-02-25 DIAGNOSIS — R457 State of emotional shock and stress, unspecified: Secondary | ICD-10-CM | POA: Diagnosis not present

## 2022-02-25 DIAGNOSIS — Z87440 Personal history of urinary (tract) infections: Secondary | ICD-10-CM

## 2022-02-25 DIAGNOSIS — R531 Weakness: Secondary | ICD-10-CM | POA: Diagnosis not present

## 2022-02-25 DIAGNOSIS — R109 Unspecified abdominal pain: Secondary | ICD-10-CM | POA: Diagnosis not present

## 2022-02-25 DIAGNOSIS — R509 Fever, unspecified: Secondary | ICD-10-CM | POA: Diagnosis not present

## 2022-02-25 LAB — COMPREHENSIVE METABOLIC PANEL
ALT: 42 U/L (ref 0–44)
ALT: 44 U/L (ref 0–44)
AST: 51 U/L — ABNORMAL HIGH (ref 15–41)
AST: 51 U/L — ABNORMAL HIGH (ref 15–41)
Albumin: 3 g/dL — ABNORMAL LOW (ref 3.5–5.0)
Albumin: 3 g/dL — ABNORMAL LOW (ref 3.5–5.0)
Alkaline Phosphatase: 33 U/L — ABNORMAL LOW (ref 38–126)
Alkaline Phosphatase: 30 U/L — ABNORMAL LOW (ref 38–126)
Anion gap: 15 (ref 5–15)
Anion gap: 12 (ref 5–15)
BUN: 17 mg/dL (ref 6–20)
BUN: 20 mg/dL (ref 6–20)
CO2: 24 mmol/L (ref 22–32)
CO2: 26 mmol/L (ref 22–32)
Calcium: 8.8 mg/dL — ABNORMAL LOW (ref 8.9–10.3)
Calcium: 8.7 mg/dL — ABNORMAL LOW (ref 8.9–10.3)
Chloride: 102 mmol/L (ref 98–111)
Chloride: 102 mmol/L (ref 98–111)
Creatinine, Ser: 1.01 mg/dL — ABNORMAL HIGH (ref 0.44–1.00)
Creatinine, Ser: 1.25 mg/dL — ABNORMAL HIGH (ref 0.44–1.00)
GFR, Estimated: >60 mL/min (ref >60)
GFR, Estimated: 51 mL/min — ABNORMAL LOW (ref >60)
Glucose, Bld: 163 mg/dL — ABNORMAL HIGH (ref 70–99)
Glucose, Bld: 194 mg/dL — ABNORMAL HIGH (ref 70–99)
Potassium: 3.4 mmol/L — ABNORMAL LOW (ref 3.5–5.1)
Potassium: 3.1 mmol/L — ABNORMAL LOW (ref 3.5–5.1)
Sodium: 141 mmol/L (ref 135–145)
Sodium: 140 mmol/L (ref 135–145)
Total Bilirubin: 1.4 mg/dL — ABNORMAL HIGH (ref 0.3–1.2)
Total Bilirubin: 1.5 mg/dL — ABNORMAL HIGH (ref 0.3–1.2)
Total Protein: 6.4 g/dL — ABNORMAL LOW (ref 6.5–8.1)
Total Protein: 6.3 g/dL — ABNORMAL LOW (ref 6.5–8.1)

## 2022-02-25 LAB — CBC WITH DIFFERENTIAL/PLATELET
Abs Immature Granulocytes: 0.04 10*3/uL (ref 0.00–0.07)
Abs Immature Granulocytes: 0.03 10*3/uL (ref 0.00–0.07)
Basophils Absolute: 0 10*3/uL (ref 0.0–0.1)
Basophils Absolute: 0 10*3/uL (ref 0.0–0.1)
Basophils Relative: 1 %
Basophils Relative: 1 %
Eosinophils Absolute: 0 10*3/uL (ref 0.0–0.5)
Eosinophils Absolute: 0 10*3/uL (ref 0.0–0.5)
Eosinophils Relative: 1 %
Eosinophils Relative: 0 %
HCT: 48.2 % — ABNORMAL HIGH (ref 36.0–46.0)
HCT: 46.4 % — ABNORMAL HIGH (ref 36.0–46.0)
Hemoglobin: 15.8 g/dL — ABNORMAL HIGH (ref 12.0–15.0)
Hemoglobin: 15.1 g/dL — ABNORMAL HIGH (ref 12.0–15.0)
Immature Granulocytes: 1 %
Immature Granulocytes: 1 %
Lymphocytes Relative: 11 %
Lymphocytes Relative: 5 %
Lymphs Abs: 0.3 10*3/uL — ABNORMAL LOW (ref 0.7–4.0)
Lymphs Abs: 0.2 10*3/uL — ABNORMAL LOW (ref 0.7–4.0)
MCH: 28.2 pg (ref 26.0–34.0)
MCH: 28.1 pg (ref 26.0–34.0)
MCHC: 32.8 g/dL (ref 30.0–36.0)
MCHC: 32.5 g/dL (ref 30.0–36.0)
MCV: 86.1 fL (ref 80.0–100.0)
MCV: 86.4 fL (ref 80.0–100.0)
Monocytes Absolute: 0.1 10*3/uL (ref 0.1–1.0)
Monocytes Absolute: 0 10*3/uL — ABNORMAL LOW (ref 0.1–1.0)
Monocytes Relative: 2 %
Monocytes Relative: 1 %
Neutro Abs: 2.7 10*3/uL (ref 1.7–7.7)
Neutro Abs: 4.2 10*3/uL (ref 1.7–7.7)
Neutrophils Relative %: 84 %
Neutrophils Relative %: 92 %
Platelets: 128 10*3/uL — ABNORMAL LOW (ref 150–400)
Platelets: 127 10*3/uL — ABNORMAL LOW (ref 150–400)
RBC: 5.6 MIL/uL — ABNORMAL HIGH (ref 3.87–5.11)
RBC: 5.37 MIL/uL — ABNORMAL HIGH (ref 3.87–5.11)
RDW: 14.1 % (ref 11.5–15.5)
RDW: 14.1 % (ref 11.5–15.5)
WBC: 3.1 10*3/uL — ABNORMAL LOW (ref 4.0–10.5)
WBC: 4.5 10*3/uL (ref 4.0–10.5)
nRBC: 0 % (ref 0.0–0.2)
nRBC: 0 % (ref 0.0–0.2)

## 2022-02-25 LAB — PROTIME-INR
INR: 1.2 (ref 0.8–1.2)
Prothrombin Time: 15.4 seconds — ABNORMAL HIGH (ref 11.4–15.2)

## 2022-02-25 LAB — APTT: aPTT: 32 seconds (ref 24–36)

## 2022-02-25 LAB — URINALYSIS, ROUTINE W REFLEX MICROSCOPIC
Bilirubin Urine: NEGATIVE
Glucose, UA: >=500 mg/dL — AB
Ketones, ur: NEGATIVE mg/dL
Nitrite: NEGATIVE
Protein, ur: >=300 mg/dL — AB
RBC / HPF: >50 RBC/hpf — ABNORMAL HIGH (ref 0–5)
Specific Gravity, Urine: 1.018 (ref 1.005–1.030)
WBC, UA: >50 WBC/hpf — ABNORMAL HIGH (ref 0–5)
pH: 5 (ref 5.0–8.0)

## 2022-02-25 LAB — BRAIN NATRIURETIC PEPTIDE: B Natriuretic Peptide: 106.3 pg/mL — ABNORMAL HIGH (ref 0.0–100.0)

## 2022-02-25 LAB — GLUCOSE, CAPILLARY
Glucose-Capillary: 153 mg/dL — ABNORMAL HIGH (ref 70–99)
Glucose-Capillary: 156 mg/dL — ABNORMAL HIGH (ref 70–99)
Glucose-Capillary: 102 mg/dL — ABNORMAL HIGH (ref 70–99)

## 2022-02-25 LAB — LACTIC ACID, PLASMA
Lactic Acid, Venous: 2.5 mmol/L (ref 0.5–1.9)
Lactic Acid, Venous: 2.8 mmol/L (ref 0.5–1.9)
Lactic Acid, Venous: 2 mmol/L (ref 0.5–1.9)
Lactic Acid, Venous: 1.4 mmol/L (ref 0.5–1.9)

## 2022-02-25 LAB — HIV ANTIBODY (ROUTINE TESTING W REFLEX): HIV Screen 4th Generation wRfx: NONREACTIVE

## 2022-02-25 LAB — MAGNESIUM: Magnesium: 1.6 mg/dL — ABNORMAL LOW (ref 1.7–2.4)

## 2022-02-25 LAB — RESP PANEL BY RT-PCR (RSV, FLU A&B, COVID)  RVPGX2
Influenza A by PCR: NEGATIVE
Influenza B by PCR: NEGATIVE
Resp Syncytial Virus by PCR: NEGATIVE
SARS Coronavirus 2 by RT PCR: NEGATIVE

## 2022-02-25 LAB — I-STAT BETA HCG BLOOD, ED (MC, WL, AP ONLY): I-stat hCG, quantitative: <5 m[IU]/mL (ref <5)

## 2022-02-25 LAB — MRSA NEXT GEN BY PCR, NASAL: MRSA by PCR Next Gen: NOT DETECTED

## 2022-02-25 MED ORDER — METRONIDAZOLE 500 MG/100ML IV SOLN
500.0000 mg | Freq: Once | INTRAVENOUS | Status: AC
  Administered 2022-02-25: 500 mg via INTRAVENOUS
  Filled 2022-02-25: qty 100

## 2022-02-25 MED ORDER — VORTIOXETINE HBR 20 MG PO TABS: 20.0000 mg | ORAL_TABLET | Freq: Every day | ORAL | Status: DC

## 2022-02-25 MED ORDER — LACTATED RINGERS IV BOLUS (SEPSIS)
500.0000 mL | Freq: Once | INTRAVENOUS | Status: AC
  Administered 2022-02-25: 500 mL via INTRAVENOUS

## 2022-02-25 MED ORDER — CHLORHEXIDINE GLUCONATE CLOTH 2 % EX PADS
6.0000 | MEDICATED_PAD | Freq: Every day | CUTANEOUS | Status: DC
  Administered 2022-02-25 – 2022-02-27 (×3): 6 via TOPICAL

## 2022-02-25 MED ORDER — VANCOMYCIN HCL IN DEXTROSE 1-5 GM/200ML-% IV SOLN: 1000.0000 mg | Freq: Once | INTRAVENOUS | Status: DC

## 2022-02-25 MED ORDER — SODIUM CHLORIDE 0.9 % IV SOLN
2.0000 g | Freq: Three times a day (TID) | INTRAVENOUS | Status: DC
  Administered 2022-02-25 – 2022-02-26 (×2): 2 g via INTRAVENOUS
  Filled 2022-02-25 (×3): qty 10

## 2022-02-25 MED ORDER — FENTANYL CITRATE PF 50 MCG/ML IJ SOSY
25.0000 ug | PREFILLED_SYRINGE | INTRAMUSCULAR | Status: DC | PRN
  Administered 2022-02-25 – 2022-02-26 (×4): 25 ug via INTRAVENOUS
  Filled 2022-02-25 (×5): qty 1

## 2022-02-25 MED ORDER — MELATONIN 3 MG PO TABS
3.0000 mg | ORAL_TABLET | Freq: Once | ORAL | Status: AC | PRN
  Administered 2022-02-26: 3 mg via ORAL
  Filled 2022-02-25: qty 1

## 2022-02-25 MED ORDER — INSULIN ASPART 100 UNIT/ML IJ SOLN
3.0000 [IU] | INTRAMUSCULAR | Status: DC
  Administered 2022-02-25 (×2): 6 [IU] via SUBCUTANEOUS
  Administered 2022-02-26: 3 [IU] via SUBCUTANEOUS

## 2022-02-25 MED ORDER — SODIUM CHLORIDE 0.9 % IV SOLN
2.0000 g | Freq: Once | INTRAVENOUS | Status: AC
  Administered 2022-02-25: 2 g via INTRAVENOUS
  Filled 2022-02-25: qty 10

## 2022-02-25 MED ORDER — ACETAMINOPHEN 325 MG PO TABS
650.0000 mg | ORAL_TABLET | Freq: Four times a day (QID) | ORAL | Status: DC | PRN
  Administered 2022-02-25 – 2022-02-27 (×3): 650 mg via ORAL
  Filled 2022-02-25 (×3): qty 2

## 2022-02-25 MED ORDER — LACTATED RINGERS IV BOLUS
1000.0000 mL | Freq: Once | INTRAVENOUS | Status: AC
  Administered 2022-02-25: 1000 mL via INTRAVENOUS

## 2022-02-25 MED ORDER — NOREPINEPHRINE 4 MG/250ML-% IV SOLN: 2.0000 ug/min | INTRAVENOUS | Status: DC

## 2022-02-25 MED ORDER — LACTATED RINGERS IV SOLN: INTRAVENOUS | Status: AC

## 2022-02-25 MED ORDER — SODIUM CHLORIDE 0.9 % IV SOLN: 2.0000 g | INTRAVENOUS | Status: DC

## 2022-02-25 MED ORDER — POLYETHYLENE GLYCOL 3350 17 G PO PACK: 17.0000 g | PACK | Freq: Every day | ORAL | Status: DC | PRN

## 2022-02-25 MED ORDER — HEPARIN SODIUM (PORCINE) 5000 UNIT/ML IJ SOLN
5000.0000 [IU] | Freq: Three times a day (TID) | INTRAMUSCULAR | Status: DC
  Administered 2022-02-25 – 2022-02-26 (×3): 5000 [IU] via SUBCUTANEOUS
  Filled 2022-02-25 (×3): qty 1

## 2022-02-25 MED ORDER — POTASSIUM CHLORIDE 20 MEQ PO PACK
40.0000 meq | PACK | Freq: Two times a day (BID) | ORAL | Status: DC
  Administered 2022-02-25: 40 meq via ORAL
  Filled 2022-02-25: qty 2

## 2022-02-25 MED ORDER — LACTATED RINGERS IV BOLUS (SEPSIS)
1000.0000 mL | Freq: Once | INTRAVENOUS | Status: AC
  Administered 2022-02-25: 1000 mL via INTRAVENOUS

## 2022-02-25 MED ORDER — ORAL CARE MOUTH RINSE: 15.0000 mL | OROMUCOSAL | Status: DC | PRN

## 2022-02-25 MED ORDER — VANCOMYCIN HCL 2000 MG/400ML IV SOLN
2000.0000 mg | Freq: Once | INTRAVENOUS | Status: AC
  Administered 2022-02-25: 2000 mg via INTRAVENOUS
  Filled 2022-02-25: qty 400

## 2022-02-25 MED ORDER — ATOMOXETINE HCL 18 MG PO CAPS: 18.0000 mg | ORAL_CAPSULE | Freq: Every day | ORAL | Status: DC

## 2022-02-25 MED ORDER — SODIUM CHLORIDE 0.9 % IV SOLN
250.0000 mL | INTRAVENOUS | Status: DC
  Administered 2022-02-25: 250 mL via INTRAVENOUS

## 2022-02-25 MED ORDER — LACTATED RINGERS IV SOLN: INTRAVENOUS | Status: DC

## 2022-02-25 MED ORDER — DOCUSATE SODIUM 100 MG PO CAPS: 100.0000 mg | ORAL_CAPSULE | Freq: Two times a day (BID) | ORAL | Status: DC | PRN

## 2022-02-25 MED ORDER — SODIUM CHLORIDE 0.9 % IV SOLN: 2.0000 g | Freq: Three times a day (TID) | INTRAVENOUS | Status: DC

## 2022-02-25 MED ORDER — VANCOMYCIN HCL 1500 MG/300ML IV SOLN: 1500.0000 mg | INTRAVENOUS | Status: DC

## 2022-02-25 MED ORDER — POTASSIUM CHLORIDE CRYS ER 20 MEQ PO TBCR
40.0000 meq | EXTENDED_RELEASE_TABLET | Freq: Two times a day (BID) | ORAL | Status: AC
  Administered 2022-02-25 – 2022-02-26 (×2): 40 meq via ORAL
  Filled 2022-02-25 (×2): qty 2

## 2022-02-25 MED ORDER — FLUTICASONE PROPIONATE 50 MCG/ACT NA SUSP
2.0000 | Freq: Every day | NASAL | Status: DC
  Administered 2022-02-25 – 2022-02-27 (×3): 2 via NASAL
  Filled 2022-02-25: qty 16

## 2022-02-25 MED ORDER — LORAZEPAM 2 MG/ML IJ SOLN
0.5000 mg | Freq: Once | INTRAMUSCULAR | Status: DC
  Filled 2022-02-25: qty 1

## 2022-02-25 NOTE — ED Provider Notes (Addendum)
[facility] EMERGENCY DEPARTMENT Provider Note   CSN: [id number] Arrival date & time: 02/25/22  9485     History  No chief complaint on file.   [name] is a 57 y.o. female.  HPI 57 year old female history of morbid obesity, type 2 diabetes, depression, hypertension, nonischemic cardiomyopathy, CHF presents today with fever, dyspnea, and range for frequency of urination.  She states she was seen by her primary care doctor a week and a half ago and was seen for depression.  She states that several days ago she began running a fever up to 102.  That was preceded by some urinary frequency.  She denies other symptoms besides the urinary frequency and the fever until today when she became dyspneic.  She had held her Lasix over the past couple of days due to urinary frequency.  She then became more short of breath.  She came in this morning via EMS and was noted to have an oxygen saturation of 88% and was febrile. Patient was treated with 650 mg of Tylenol, started on 6 L nasal cannula and noted to have temp to 104 with heart rate of 130 blood pressure 126/76 and CBG of 126 prehospital    Home Medications Prior to Admission medications   Medication Sig Start Date End Date Taking? Authorizing Provider  atomoxetine (STRATTERA) 18 MG capsule Take 1 capsule (18 mg total) by mouth daily. Patient not taking: Reported on 02/14/2022 08/09/21   [name], MD  carvedilol (COREG) 25 MG tablet Take 1 tablet (25 mg total) by mouth 2 (two) times daily with a meal. 07/13/21   Minus Breeding, MD  fluticasone [facility]) 50 MCG/ACT nasal spray SPRAY 2 SPRAYS INTO EACH NOSTRIL EVERY DAY 10/28/21   [name], MD  furosemide (LASIX) 80 MG tablet Take 1 tablet (80 mg total) by mouth daily. 04/02/21   Almyra Deforest, PA  linaclotide (LINZESS) 145 MCG CAPS capsule TAKE 1 CAPSULE BY MOUTH DAILY BEFORE BREAKFAST. Patient not taking: Reported on 02/14/2022 08/09/21   [name], MD  nystatin  (MYCOSTATIN/NYSTOP) powder Apply 1 application topically 3 (three) times daily. Patient not taking: Reported on 02/14/2022 04/10/21   Hoyt Koch, MD  potassium chloride SA (KLOR-CON M20) 20 MEQ tablet Take 2 tablets (40 mEq total) by mouth daily. 04/02/21   Almyra Deforest, PA  sacubitril-valsartan (ENTRESTO) 97-103 MG Take 1 tablet by mouth 2 (two) times daily. 04/02/21   Almyra Deforest, PA  simvastatin (ZOCOR) 20 MG tablet Take 1 tablet (20 mg total) by mouth daily. 02/07/22   [name], MD  vortioxetine HBr (TRINTELLIX) 20 MG TABS tablet Take one tablet by mouth (20 mg total) daily. Patient not taking: Reported on 02/14/2022 08/09/21   [name], MD  XIGDUO XR 11-998 MG TB24 TAKE 1 TABLET BY MOUTH EVERY DAY 11/07/21   [name], MD      Allergies    Fluoxetine, Cymbalta [duloxetine hcl], and Penicillins    Review of Systems   Review of Systems  Physical Exam Updated Vital Signs BP (!) 86/47   Pulse (!) 101   Temp (!) 101.2 F (38.4 C) (Axillary)   Resp (!) 24   SpO2 95%  Physical Exam Vitals and nursing note reviewed.  Constitutional:      General: She is not in acute distress.    Appearance: She is obese. She is ill-appearing.  HENT:     Head: Normocephalic.     Right Ear:  External ear normal.     Left Ear: External ear normal.     Nose: Nose normal.     Mouth/Throat:     Pharynx: Oropharynx is clear.  Cardiovascular:     Rate and Rhythm: Regular rhythm. Tachycardia present.     Pulses: Normal pulses.     Heart sounds: Normal heart sounds.  Pulmonary:     Effort: Pulmonary effort is normal.     Breath sounds: Normal breath sounds.  Abdominal:     General: Bowel sounds are normal. There is distension.     Palpations: Abdomen is soft.  Musculoskeletal:        General: Normal range of motion.     Cervical back: Normal range of motion.  Skin:    General: Skin is warm and dry.     Capillary Refill: Capillary refill takes less than 2 seconds.   Neurological:     General: No focal deficit present.     Mental Status: She is alert.  Psychiatric:        Mood and Affect: Mood normal.     ED Results / Procedures / Treatments   Labs (all labs ordered are listed, but only abnormal results are displayed) Labs Reviewed  LACTIC ACID, PLASMA - Abnormal; Notable for the following components:      Result Value   Lactic Acid, Venous 2.5 (*)    All other components within normal limits  COMPREHENSIVE METABOLIC PANEL - Abnormal; Notable for the following components:   Potassium 3.4 (*)    Glucose, Bld 163 (*)    Creatinine, Ser 1.01 (*)    Calcium 8.8 (*)    Total Protein 6.4 (*)    Albumin 3.0 (*)    AST 51 (*)    Alkaline Phosphatase 33 (*)    Total Bilirubin 1.4 (*)    All other components within normal limits  CBC WITH DIFFERENTIAL/PLATELET - Abnormal; Notable for the following components:   WBC 3.1 (*)    RBC 5.60 (*)    Hemoglobin 15.8 (*)    HCT 48.2 (*)    Platelets 128 (*)    Lymphs Abs 0.3 (*)    All other components within normal limits  URINALYSIS, ROUTINE W REFLEX MICROSCOPIC - Abnormal; Notable for the following components:   Color, Urine AMBER (*)    APPearance CLOUDY (*)    Glucose, UA >=500 (*)    Hgb urine dipstick MODERATE (*)    Protein, ur >=300 (*)    Leukocytes,Ua SMALL (*)    RBC / HPF >50 (*)    WBC, UA >50 (*)    Bacteria, UA MANY (*)    All other components within normal limits  BRAIN NATRIURETIC PEPTIDE - Abnormal; Notable for the following components:   B Natriuretic Peptide 106.3 (*)    All other components within normal limits  LACTIC ACID, PLASMA - Abnormal; Notable for the following components:   Lactic Acid, Venous 2.8 (*)    All other components within normal limits  COMPREHENSIVE METABOLIC PANEL - Abnormal; Notable for the following components:   Potassium 3.1 (*)    Glucose, Bld 194 (*)    Creatinine, Ser 1.25 (*)    Calcium 8.7 (*)    Total Protein 6.3 (*)    Albumin 3.0 (*)     AST 51 (*)    Alkaline Phosphatase 30 (*)    Total Bilirubin 1.5 (*)    GFR, Estimated 51 (*)    All other  components within normal limits  CBC WITH DIFFERENTIAL/PLATELET - Abnormal; Notable for the following components:   RBC 5.37 (*)    Hemoglobin 15.1 (*)    HCT 46.4 (*)    Platelets 127 (*)    Lymphs Abs 0.2 (*)    Monocytes Absolute 0.0 (*)    All other components within normal limits  PROTIME-INR - Abnormal; Notable for the following components:   Prothrombin Time 15.4 (*)    All other components within normal limits  RESP PANEL BY RT-PCR (RSV, FLU A&B, COVID)  RVPGX2  CULTURE, BLOOD (ROUTINE X 2)  CULTURE, BLOOD (ROUTINE X 2)  RESP PANEL BY RT-PCR (RSV, FLU A&B, COVID)  RVPGX2  CULTURE, BLOOD (ROUTINE X 2)  CULTURE, BLOOD (ROUTINE X 2)  URINE CULTURE  APTT  LACTIC ACID, PLASMA  LACTIC ACID, PLASMA  URINALYSIS, ROUTINE W REFLEX MICROSCOPIC  I-STAT BETA HCG BLOOD, ED ([facility], [facility], [facility] ONLY)  I-STAT BETA HCG BLOOD, ED ([facility], [facility], [facility] ONLY)    EKG EKG Interpretation  Date/Time:  Monday February 25 2022 09:05:24 EST Ventricular Rate:  117 PR Interval:  145 QRS Duration: 133 QT Interval:  351 QTC Calculation: 490 R Axis:   -74 Text Interpretation: Sinus tachycardia Multiform ventricular premature complexes Nonspecific IVCD with LAD rate has increased since first prior tracing Confirmed by Pattricia Boss [phone]) on 02/25/2022 11:36:34 AM  Radiology DG Chest 2 View  Result Date: 02/25/2022 CLINICAL DATA:  Fevers and short of breath. EXAM: CHEST - 2 VIEW COMPARISON:  06/21/2020 FINDINGS: Cardiomediastinal contours are unremarkable. There is no pleural effusion or edema. No airspace opacities identified. Scoliosis deformity noted involving the thoracic spine. IMPRESSION: No active cardiopulmonary abnormalities. Electronically Signed   By: Kerby Moors M.D.   On: 02/25/2022 08:25    Procedures .Critical Care  Performed by: Pattricia Boss, MD Authorized by: Pattricia Boss, MD    Critical care provider statement:    Critical care time (minutes):  65   Critical care was time spent personally by me on the following activities:  Development of treatment plan with patient or surrogate, discussions with consultants, evaluation of patient's response to treatment, examination of patient, ordering and review of laboratory studies, ordering and review of radiographic studies, ordering and performing treatments and interventions, pulse oximetry, re-evaluation of patient's condition and review of old charts     Medications Ordered in ED Medications  lactated ringers infusion ( Intravenous New Bag/Given 02/25/22 0956)  vancomycin (VANCOREADY) IVPB 2000 mg/400 mL (2,000 mg Intravenous New Bag/Given 02/25/22 1229)  LORazepam (ATIVAN) injection 0.5 mg (0 mg Intravenous Hold 02/25/22 1227)  lactated ringers infusion ( Intravenous Not Given 02/25/22 1207)  lactated ringers bolus 1,000 mL (1,000 mLs Intravenous New Bag/Given 02/25/22 1228)  lactated ringers bolus 500 mL (0 mLs Intravenous Stopped 02/25/22 1151)  aztreonam (AZACTAM) 2 g in sodium chloride 0.9 % 100 mL IVPB (0 g Intravenous Stopped 02/25/22 1046)  metroNIDAZOLE (FLAGYL) IVPB 500 mg (0 mg Intravenous Stopped 02/25/22 1151)    ED Course/ Medical Decision Making/ A&P                           Medical Decision Making Amount and/or Complexity of Data Reviewed Labs: ordered. Radiology: ordered. ECG/medicine tests: ordered.  Risk Prescription drug management. Decision regarding hospitalization.   This patient presents to the ED for concern of shortness of breath, urinary frequency, and fever, this involves an extensive number of treatment options, and is a  complaint that carries with it a high risk of complications and morbidity.  The differential diagnosis includes sepsis, CHF, pneumonia, viral infections including COVID and flu, acute respiratory failure, urinary tract infection, pyelonephritis   Co morbidities that complicate  the patient evaluation  Morbid obesity, hypertension, nonischemic cardiomyopathy apathy, chronic CHF, type 2 diabetes   Additional history obtained:  Additional history obtained from reviewed results of echo on 07/10/2021 with EF 30 to 35% External records from outside source obtained and reviewed including reviewed notes from primary care physician for diabetes and hypertension in June 2023   Lab Tests:  I Ordered, and personally interpreted labs.  The pertinent results include: Patient evaluated with blood cultures, lactic acid elevated 2.8, CBC, complete metabolic panel, urinalysis Significant for greater than 50 blood cells and red blood cells with many bacteria present this was a catheterized specimen Hypokalemia noted Elevated blood sugar at 194 Creatinine increased to 1.25 from baseline of normal COVID swabs obtained and negative for COVID, flu, RSV   Imaging Studies ordered:  I ordered imaging studies including x-Taiz Bickle reviewed interpreted no evidence of focal infiltrate increased markings throughout noted I independently visualized and interpreted imaging which showed no acute infiltratee I agree with the radiologist interpretation   Cardiac Monitoring: / EKG:  The patient was maintained on a cardiac monitor.  I personally viewed and interpreted the cardiac monitored which showed an underlying rhythm of: tachycardia   Consultations Obtained:  I requested consultation with the critical care and discussed lab and imaging findings as well as pertinent plan - they recommend: Waiting evaluation   Problem List / ED Course / Critical interventions / Medication management  Sepsis UTI Dyspnea History of CHF-some increased markings noted on chest x-Sullivan Jacuinde, BNP pending Patient oxygenating well on nasal cannula I ordered medication including broad-spectrum antibiotics, IV fluids for sepsis and UTI Reevaluation of the patient after these medicines showed that the patient  improved I have reviewed the patients home medicines and have made adjustments as needed 12:57 PM Blood pressure is decreased with systolic blood pressure approximately 80 Will give additional fluid bolus  Social Determinants of Health:  Morbid obesity Currently at ideal body weight at 55 kg   Test / Admission - Considered:  Plan admission for sepsis 12:57 PM IV fluids still infusing the last blood pressure measured 86/47        Final Clinical Impression(s) / ED Diagnoses Final diagnoses:  Sepsis, due to unspecified organism, unspecified whether acute organ dysfunction present [facility])    Rx / DC Orders ED Discharge Orders     None         Pattricia Boss, MD 02/25/22 1257    Pattricia Boss, MD 02/25/22 1346

## 2022-02-25 NOTE — Progress Notes (Signed)
eLink Physician-Brief Progress Note Patient Name: [name] DOB: [birth year] MRN: [id number]   Date of Service  02/25/2022  HPI/Events of Note  Received request for sleep aid  eICU Interventions  Melatonin ordered     Intervention Category Minor Interventions: Routine modifications to care plan (e.g. PRN medications for pain, fever)  Judd Lien 02/25/2022, 11:51 PM

## 2022-02-25 NOTE — Sepsis Progress Note (Addendum)
29 Notified bedside nurse of need to draw another repeat lactic acid as the second result was higher than the first.  1440 Pt arrived on unit. Discussed with bedside RN about need for repeat lactic acid. She stated that she will notify lab.

## 2022-02-25 NOTE — Progress Notes (Signed)
Discussed bedside PICC placement with patient and spouse. Patient requested to wait until 02-26-22 AM to decide. VSS. Primary RN Tzintzun notified.

## 2022-02-25 NOTE — Progress Notes (Addendum)
PICC consult: Pt febrile with soft BP. Message to Dr. Lynetta Mare requesting to hold PICC until VS stabilize. USGPIV placed L anterior forearm for vasopressor administration. OK, per MD.

## 2022-02-25 NOTE — Sepsis Progress Note (Signed)
eLink is following this Code Sepsis. °

## 2022-02-25 NOTE — Progress Notes (Signed)
Pharmacy Antibiotic Note  [name] is a 57 y.o. female for which pharmacy has been consulted for vancomycin and aztreonam dosing for sepsis.  Patient with a history of obesity, T2DM, depression, HTN, cardiomyopathy, HF. Patient presenting with fever and dyspnea.  SCr 1.25 - up from 0.72 in December WBC 4.5; LA 2.8; T 101.2; HR 127; RR 24 COVID/Flu - neg  Plan: Aztreonam 2g q8h Metronidazole per MD Vancomycin 2000 mg once then 1500 mg q24hr (eAUC 533.6) unless change in renal function --Scr above baseline may require dosing per level if continues to worsen  Trend WBC, Fever, Renal function F/u cultures, clinical course, WBC, fever De-escalate when able Levels at steady state     Temp (24hrs), Avg:100.5 F (38.1 C), Min:99.8 F (37.7 C), Max:101.2 F (38.4 C)  Recent Labs  Lab 02/25/22 0802 02/25/22 0803  WBC 3.1*  --   CREATININE 1.01*  --   LATICACIDVEN  --  2.5*    Estimated Creatinine Clearance: 86.9 mL/min (A) (by C-G formula based on SCr of 1.01 mg/dL (H)).    Allergies  Allergen Reactions   Fluoxetine Other (See Comments)    More depressed   Cymbalta [Duloxetine Hcl] Other (See Comments)    depressed   Penicillins     REACTION: rash Has patient had a PCN reaction causing immediate rash, facial/tongue/throat swelling, SOB or lightheadedness with hypotension: YES Has patient had a PCN reaction causing severe rash involving mucus membranes or skin necrosis: NO Has patient had a PCN reaction that required hospitalization: YES Has patient had a PCN reaction occurring within the last 10 years: NO If all of the above answers are "NO", then may proceed with Cephalosporin use.     Antimicrobials this admission: aztreonam 1/1 >>  vancomycin 1/1 >>  flagyl 1/1 >>   Microbiology results: Pending  Thank you for allowing pharmacy to be a part of this patient's care.  Lorelei Pont, PharmD, BCPS 02/25/2022 9:45 AM ED Clinical Pharmacist -  [phone]

## 2022-02-25 NOTE — ED Notes (Signed)
PA Murphy aware of Lactic. Pt to be prioritized for a treatment room

## 2022-02-25 NOTE — Progress Notes (Incomplete)
Patient complained of pain in her right leg when she sneezed or passed gas. Pain is sharp and stabbing  but goes a way once the cough , sneeze or passing of gas on over.

## 2022-02-25 NOTE — Progress Notes (Signed)
A 20g L medial forearm USGPIV (ultrasound guided PIV) has been placed for short-term vasopressor infusion. A correctly placed ivWatch must be used when administering Vasopressors. Should this treatment be needed beyond 72 hours, central line access should be obtained.  It will be the responsibility of the bedside nurse to follow best practice to prevent extravasations.

## 2022-02-25 NOTE — ED Triage Notes (Signed)
Pt bib ems from home with reports of covid like symptoms since the 20th. This morning pt became shob. Room air saturation 88%.Given '650mg'$  tylenol en route.  6L [place] at 98% 12 lead LBBB 104F 126/76 HR 130 CBG 126

## 2022-02-25 NOTE — H&P (Signed)
NAME:  [name], MRN:  [id number], DOB:  07-19-1965, LOS: 0 ADMISSION DATE:  02/25/2022, CONSULTATION DATE:  02/25/2022 REFERRING MD:  Jeanell Sparrow - ED MHC, CHIEF COMPLAINT:  septic shock    History of Present Illness:  57 year old woman who presented with septic shock from presumed urinary source.   Came via EMS today because of increased dyspnea.  Fever and chills since 12/28 with increased urinary frequency and hematuria.  History of prior UTI's, no gallstones.   Has received 2 L crystalloid and antibiotics. Lactate elevated at 2.8.  Pertinent  Medical History   Past Medical History:  Diagnosis Date   ADD (attention deficit disorder)    Anxiety    Arthritis    both knees right worse than left   Carpal tunnel syndrome of right wrist    Depression    Essential hypertension, benign    Gallstones    History of smoking 06/22/2016   Hyperlipidemia associated with type 2 diabetes mellitus ([place]), on Zocor 04/04/2011   Hypertension associated with diabetes ([place]) 06/03/2008   Migraines    Morbid obesity ([place])    Morbid obesity with BMI of 50.0-59.9, adult ([place]) 07/12/2006   NICM (nonischemic cardiomyopathy) ([place]) 07/12/2006   01/16/18 ECHO:    - Procedure narrative: Transthoracic echocardiography. Image   quality was suboptimal. The study was technically difficult.   Intravenous contrast (Definity) was administered. - Left ventricle: The cavity size was moderately dilated. Wall   thickness was increased in a pattern of mild LVH. Systolic   function was moderately to severely reduced. The estimated   ejection fraction w   Normal coronary arteries 04/17/2016   OSA on CPAP 10/28/2014   Spinal stenosis    back pain   Spondylosis without myelopathy or radiculopathy, lumbar region 12/04/2017   Type 2 diabetes mellitus with hyperglycemia ([place])    Significant Hospital Events: Including procedures, antibiotic start and stop dates in addition to other pertinent events   1/1 - admission  Interim History /  Subjective:  Complains of usual chronic low back pain.   Objective   Blood pressure (!) 81/48, pulse 96, temperature (!) 101.2 F (38.4 C), temperature source Axillary, resp. rate (!) 24, SpO2 90 %.       No intake or output data in the 24 hours ending 02/25/22 1332 There were no vitals filed for this visit.  Examination: General: morbid obesity. Awake and following commands HENT: no icterus, oral mucosa moist Lungs: Clear to auscultation bilaterally Cardiovascular: Extremities warm.  Heart sounds unremarkable. Abdomen: Subjective lower abdominal tenderness but no costo vertebral angle tenderness Extremities: No edema Neuro: Neurologically intact GU: No Foley catheter  Ancillary tests personally reviewed  Potassium 3.1 Creatinine 1.25 White count 3.1 Hemoglobin 15.8 Platelet count 128 UA shows high reds high whites and positive bacteria Respiratory viral panel negative  Assessment & Plan:   Critically ill due to septic shock requiring titration of fluids and vasopressors secondary to urinary tract infection -Titrate norepinephrine to keep MAP greater than 65 -Continue fluid resuscitation to a total of 5 L.  Monitor for signs of volume overload given history of congestive heart failure. -Continue empiric antibiotics pending culture results -CT abdomen pelvis to rule out urinary obstruction, stone, particularly in the context of hematuria  Chronic back pain -Holding home medications -As needed fentanyl  Nonischemic cardiomyopathy -Holding home GD HFT due to hypotension  Type 2 diabetes -Sliding scale insulin -At risk for complicated pyelonephritis  Major depression -Continue psychiatric medications  Best Practice (right click and "Reselect all SmartList Selections" daily)   Diet/type: clear liquids DVT prophylaxis: prophylactic heparin  GI prophylaxis: N/A Lines: N/A Foley:  N/A Code Status:  full code Last date of multidisciplinary goals of care discussion  [patient and husband updated at bedside 1/1]  CRITICAL CARE Performed by: Kipp Brood   Total critical care time: 35 minutes  Critical care time was exclusive of separately billable procedures and treating other patients.  Critical care was necessary to treat or prevent imminent or life-threatening deterioration.  Critical care was time spent personally by me on the following activities: development of treatment plan with patient and/or surrogate as well as nursing, discussions with consultants, evaluation of patient's response to treatment, examination of patient, obtaining history from patient or surrogate, ordering and performing treatments and interventions, ordering and review of laboratory studies, ordering and review of radiographic studies, pulse oximetry, re-evaluation of patient's condition and participation in multidisciplinary rounds.  Kipp Brood, MD [facility] ICU Physician [place]  Pager: [phone] Mobile: [phone] After hours: [phone].

## 2022-02-25 NOTE — ED Notes (Signed)
Patient on room air. Patient O2 sats are within normal limits. Patients work of breathing normal as well.

## 2022-02-26 ENCOUNTER — Other Ambulatory Visit: Payer: Self-pay

## 2022-02-26 ENCOUNTER — Emergency Department: Payer: Self-pay

## 2022-02-26 DIAGNOSIS — R6521 Severe sepsis with septic shock: Secondary | ICD-10-CM | POA: Diagnosis not present

## 2022-02-26 DIAGNOSIS — A419 Sepsis, unspecified organism: Secondary | ICD-10-CM | POA: Diagnosis not present

## 2022-02-26 LAB — CBC WITH DIFFERENTIAL/PLATELET
Abs Immature Granulocytes: 0.02 10*3/uL (ref 0.00–0.07)
Basophils Absolute: 0 10*3/uL (ref 0.0–0.1)
Basophils Relative: 1 %
Eosinophils Absolute: 0 10*3/uL (ref 0.0–0.5)
Eosinophils Relative: 0 %
HCT: 39.5 % (ref 36.0–46.0)
Hemoglobin: 12.6 g/dL (ref 12.0–15.0)
Immature Granulocytes: 0 %
Lymphocytes Relative: 15 %
Lymphs Abs: 0.9 10*3/uL (ref 0.7–4.0)
MCH: 27.9 pg (ref 26.0–34.0)
MCHC: 31.9 g/dL (ref 30.0–36.0)
MCV: 87.4 fL (ref 80.0–100.0)
Monocytes Absolute: 0.5 10*3/uL (ref 0.1–1.0)
Monocytes Relative: 8 %
Neutro Abs: 4.4 10*3/uL (ref 1.7–7.7)
Neutrophils Relative %: 76 %
Platelets: 109 10*3/uL — ABNORMAL LOW (ref 150–400)
RBC: 4.52 MIL/uL (ref 3.87–5.11)
RDW: 14.2 % (ref 11.5–15.5)
WBC: 5.8 10*3/uL (ref 4.0–10.5)
nRBC: 0 % (ref 0.0–0.2)

## 2022-02-26 LAB — RESPIRATORY PANEL BY PCR

## 2022-02-26 LAB — COMPREHENSIVE METABOLIC PANEL
ALT: 46 U/L — ABNORMAL HIGH (ref 0–44)
AST: 50 U/L — ABNORMAL HIGH (ref 15–41)
Albumin: 2.3 g/dL — ABNORMAL LOW (ref 3.5–5.0)
Alkaline Phosphatase: 19 U/L — ABNORMAL LOW (ref 38–126)
Anion gap: 7 (ref 5–15)
BUN: 16 mg/dL (ref 6–20)
CO2: 23 mmol/L (ref 22–32)
Calcium: 7.8 mg/dL — ABNORMAL LOW (ref 8.9–10.3)
Chloride: 108 mmol/L (ref 98–111)
Creatinine, Ser: 0.96 mg/dL (ref 0.44–1.00)
GFR, Estimated: >60 mL/min (ref >60)
Glucose, Bld: 109 mg/dL — ABNORMAL HIGH (ref 70–99)
Potassium: 3.3 mmol/L — ABNORMAL LOW (ref 3.5–5.1)
Sodium: 138 mmol/L (ref 135–145)
Total Bilirubin: 0.9 mg/dL (ref 0.3–1.2)
Total Protein: 4.9 g/dL — ABNORMAL LOW (ref 6.5–8.1)

## 2022-02-26 LAB — GLUCOSE, CAPILLARY
Glucose-Capillary: 106 mg/dL — ABNORMAL HIGH (ref 70–99)
Glucose-Capillary: 133 mg/dL — ABNORMAL HIGH (ref 70–99)

## 2022-02-26 MED ORDER — SODIUM CHLORIDE 0.9 % IV SOLN
2.0000 g | INTRAVENOUS | Status: DC
  Administered 2022-02-26 – 2022-02-27 (×2): 2 g via INTRAVENOUS
  Filled 2022-02-26 (×2): qty 20

## 2022-02-26 MED ORDER — MAGNESIUM SULFATE 4 GM/100ML IV SOLN
4.0000 g | Freq: Once | INTRAVENOUS | Status: AC
  Administered 2022-02-26: 4 g via INTRAVENOUS
  Filled 2022-02-26: qty 100

## 2022-02-26 MED ORDER — PHENOL 1.4 % MT LIQD
1.0000 | OROMUCOSAL | Status: DC | PRN
  Administered 2022-02-26 (×2): 1 via OROMUCOSAL
  Filled 2022-02-26: qty 177

## 2022-02-26 MED ORDER — ENOXAPARIN SODIUM 80 MG/0.8ML IJ SOSY
80.0000 mg | PREFILLED_SYRINGE | INTRAMUSCULAR | Status: DC
  Administered 2022-02-26 – 2022-02-27 (×2): 80 mg via SUBCUTANEOUS
  Filled 2022-02-26 (×4): qty 0.8

## 2022-02-26 MED ORDER — TRAMADOL HCL 50 MG PO TABS
50.0000 mg | ORAL_TABLET | Freq: Four times a day (QID) | ORAL | Status: DC | PRN
  Administered 2022-02-26: 50 mg via ORAL
  Filled 2022-02-26: qty 1

## 2022-02-26 MED ORDER — DIPHENHYDRAMINE HCL 25 MG PO CAPS: 50.0000 mg | ORAL_CAPSULE | Freq: Once | ORAL | Status: DC | PRN

## 2022-02-26 MED ORDER — SODIUM CHLORIDE 0.9 % IV SOLN: 2.0000 g | INTRAVENOUS | Status: DC

## 2022-02-26 NOTE — Progress Notes (Signed)
Patient transferred to 5N22 via bed after report called. Her husband is aware of room # and transfer

## 2022-02-26 NOTE — Progress Notes (Signed)
  Transition of Care [facility]) Screening Note   Patient Details  Name: [name] Date of Birth: 07-12-1965   Transition of Care [facility]) CM/SW Contact:    Bartholomew Crews, RN Phone Number: [phone] 02/26/2022, 9:32 AM    Transition of Care Department [facility]) has reviewed patient and no TOC needs have been identified at this time. We will continue to monitor patient advancement through interdisciplinary progression rounds. If new patient transition needs arise, please place a TOC consult.

## 2022-02-26 NOTE — Progress Notes (Signed)
eLink Physician-Brief Progress Note Patient Name: [name] DOB: Jul 20, 1965 MRN: [id number]   Date of Service  02/26/2022  HPI/Events of Note  Still unable to sleep despite melatonin  eICU Interventions  Diphenhydramine 50 mg PO ordered     Intervention Category Minor Interventions: Routine modifications to care plan (e.g. PRN medications for pain, fever)  Shona Needles Addis Tuohy 02/26/2022, 2:30 AM

## 2022-02-26 NOTE — Evaluation (Signed)
Physical Therapy Evaluation and Discharge Patient Details Name: [name] MRN: [id number] DOB: 02/17/1966 Today's Date: 02/26/2022  History of Present Illness  Pt is a 57 y.o. F who presents with septic shock from presumed urinary source. Significant PMH: anxiety, morbid obesity, DM2.  Clinical Impression  Patient evaluated by Physical Therapy with no further acute PT needs identified. Pt reports 2+ years of right sided low back pain with intermittent radiculopathy. Pt reports exacerbating factors are standing for long periods and immobility. As a result of back and R knee pain, pt has become increasingly limited in mobility; recently having difficulty climbing stairs or ambulating community distances. Had been using a scooter, which recently broke. On PT evaluation, pt ambulating ~75 ft with no assistive device or physical assist. Demonstrates decreased cardiopulmonary endurance, impaired dynamic balance, and functional weakness. Would greatly benefit from OPPT to address deficits and maximize functional mobility; pt interested in aquatic therapy as well. All education has been completed and the patient has no further questions. PT is signing off. Thank you for this referral.      Recommendations for follow up therapy are one component of a multi-disciplinary discharge planning process, led by the attending physician.  Recommendations may be updated based on patient status, additional functional criteria and insurance authorization.  Follow Up Recommendations Outpatient PT ([facility])      Assistance Recommended at Discharge PRN  Patient can return home with the following  Assistance with cooking/housework;Assist for transportation;Help with stairs or ramp for entrance    Equipment Recommendations None recommended by PT  Recommendations for Other Services       Functional Status Assessment Patient has had a recent decline in their functional status and demonstrates the ability to make  significant improvements in function in a reasonable and predictable amount of time.     Precautions / Restrictions Precautions Precautions: Fall Restrictions Weight Bearing Restrictions: No      Mobility  Bed Mobility               General bed mobility comments: Sitting up on EOB upon arrival    Transfers Overall transfer level: Needs assistance Equipment used: None Transfers: Sit to/from Stand Sit to Stand: Supervision           General transfer comment: Use of momentum to power up    Ambulation/Gait Ambulation/Gait assistance: Supervision Gait Distance (Feet): 75 Feet Assistive device: None Gait Pattern/deviations: Step-through pattern, Decreased stride length, Wide base of support Gait velocity: decreased Gait velocity interpretation: <1.8 ft/sec, indicate of risk for recurrent falls   General Gait Details: Supervision for safety, pt reaching out for intermittent single UE support, mildly antalgic with increased distance, decreased weight through RLE  Stairs            Wheelchair Mobility    Modified Rankin (Stroke Patients Only)       Balance Overall balance assessment: Needs assistance Sitting-balance support: Feet supported Sitting balance-Leahy Scale: Good     Standing balance support: No upper extremity supported, During functional activity Standing balance-Leahy Scale: Good                               Pertinent Vitals/Pain Pain Assessment Pain Assessment: Faces Faces Pain Scale: Hurts little more Pain Location: R low back Pain Descriptors / Indicators: Grimacing, Guarding Pain Intervention(s): Monitored during session    Home Living Family/patient expects to be discharged to:: Private residence Living Arrangements: Spouse/significant other;Children Available Help  at Discharge: Family Type of Home: House Home Access: Stairs to enter Entrance Stairs-Rails: Psychiatric nurse of Steps:  1 Alternate Level Stairs-Number of Steps:  (flight) Home Layout: Two level Home Equipment: Rollator (4 wheels);Electric scooter;Other (comment) (electric scooter broken) Additional Comments: Shower upstairs, 1/2 bath downstairs    Prior Function Prior Level of Function : Independent/Modified Independent             Mobility Comments: household ambulator       Hand Dominance        Extremity/Trunk Assessment   Upper Extremity Assessment Upper Extremity Assessment: Overall WFL for tasks assessed    Lower Extremity Assessment Lower Extremity Assessment: Overall WFL for tasks assessed    Cervical / Trunk Assessment Cervical / Trunk Assessment: Other exceptions Cervical / Trunk Exceptions: increased body habitus  Communication   Communication: No difficulties  Cognition Arousal/Alertness: Awake/alert Behavior During Therapy: WFL for tasks assessed/performed Overall Cognitive Status: Within Functional Limits for tasks assessed                                          General Comments      Exercises Other Exercises Other Exercises: Encouraged seated lumbar flexion and sit to stands for functional strengthening   Assessment/Plan    PT Assessment Patient does not need any further PT services  PT Problem List         PT Treatment Interventions      PT Goals (Current goals can be found in the Care Plan section)  Acute Rehab PT Goals Patient Stated Goal: to improve quality of life PT Goal Formulation: All assessment and education complete, DC therapy    Frequency       Co-evaluation               AM-PAC PT "6 Clicks" Mobility  Outcome Measure Help needed turning from your back to your side while in a flat bed without using bedrails?: None Help needed moving from lying on your back to sitting on the side of a flat bed without using bedrails?: None Help needed moving to and from a bed to a chair (including a wheelchair)?: None Help  needed standing up from a chair using your arms (e.g., wheelchair or bedside chair)?: A Little Help needed to walk in hospital room?: A Little Help needed climbing 3-5 steps with a railing? : A Lot 6 Click Score: 20    End of Session   Activity Tolerance: Patient tolerated treatment well Patient left: in bed;with call bell/phone within reach;with family/visitor present Nurse Communication: Mobility status PT Visit Diagnosis: Unsteadiness on feet (R26.81);Difficulty in walking, not elsewhere classified (R26.2)    Time: 1448-1856 PT Time Calculation (min) (ACUTE ONLY): 39 min   Charges:   PT Evaluation $PT Eval Low Complexity: 1 Low PT Treatments $Therapeutic Activity: 23-37 mins        Wyona Almas, PT, DPT Acute Rehabilitation Services Office [phone]   Deno Etienne 02/26/2022, 4:12 PM

## 2022-02-26 NOTE — Progress Notes (Signed)
   NAME:  [name], MRN:  [id number], DOB:  1965/07/26, LOS: 1 ADMISSION DATE:  02/25/2022, CONSULTATION DATE:  02/25/2022 REFERRING MD:  Jeanell Sparrow - ED MHC, CHIEF COMPLAINT:  septic shock    History of Present Illness:  57 year old woman who presented with septic shock from presumed urinary source.   Came via EMS today because of increased dyspnea.  Fever and chills since 12/28 with increased urinary frequency and hematuria.  History of prior UTI's, no gallstones.   Has received 2 L crystalloid and antibiotics. Lactate elevated at 2.8.  Pertinent  Medical History   Past Medical History:  Diagnosis Date   ADD (attention deficit disorder)    Anxiety    Arthritis    both knees right worse than left   Carpal tunnel syndrome of right wrist    Depression    Essential hypertension, benign    Gallstones    History of smoking 06/22/2016   Hyperlipidemia associated with type 2 diabetes mellitus ([place]), on Zocor 04/04/2011   Hypertension associated with diabetes ([place]) 06/03/2008   Migraines    Morbid obesity ([place])    Morbid obesity with BMI of 50.0-59.9, adult ([place]) 07/12/2006   NICM (nonischemic cardiomyopathy) ([place]) 07/12/2006   01/16/18 ECHO:    - Procedure narrative: Transthoracic echocardiography. Image   quality was suboptimal. The study was technically difficult.   Intravenous contrast (Definity) was administered. - Left ventricle: The cavity size was moderately dilated. Wall   thickness was increased in a pattern of mild LVH. Systolic   function was moderately to severely reduced. The estimated   ejection fraction w   Normal coronary arteries 04/17/2016   OSA on CPAP 10/28/2014   Spinal stenosis    back pain   Spondylosis without myelopathy or radiculopathy, lumbar region 12/04/2017   Type 2 diabetes mellitus with hyperglycemia ([place])    Significant Hospital Events: Including procedures, antibiotic start and stop dates in addition to other pertinent events   1/1 - admission  Interim History /  Subjective:  Improved overall.  Objective   Blood pressure 104/68, pulse 82, temperature 98.3 F (36.8 C), resp. rate 20, height '5\' 4"'$  (1.626 m), weight (!) 144.2 kg, SpO2 94 %.        Intake/Output Summary (Last 24 hours) at 02/26/2022 0856 Last data filed at 02/26/2022 0800 Gross per 24 hour  Intake 7683.8 ml  Output 1200 ml  Net 6483.8 ml   Filed Weights   02/25/22 1500 02/26/22 0350  Weight: (!) 142.8 kg (!) 144.2 kg    Examination: No distress Lungs clear Abd soft Ext warm, no edema Aox3, loquacious  Totality of labs c/w NASH No white count  Assessment & Plan:   Probable urosepsis- improved - Switch to ceftriaxone, f/u culture data, fever curve  Leukopenia, fevers- will send RVP to complete workup  Chronic back pain - PRN tylenol  Ambulatory dysfunction- PT/OT  Nonischemic cardiomyopathy -Holding home GD HFT due to hypotension  Type 2 diabetes - DC SSI, sugars are fine, can resume PO meds on DC  Major depression -Continue psychiatric medications  Irregular menses, complex ovarian cysts- GYN will arrange OP f/u in DC section  Ok to transfer to floor, appreciate TRH taking over starting 02/27/22  Erskine Emery MD PCCM

## 2022-02-27 DIAGNOSIS — R6521 Severe sepsis with septic shock: Secondary | ICD-10-CM

## 2022-02-27 DIAGNOSIS — A419 Sepsis, unspecified organism: Secondary | ICD-10-CM | POA: Diagnosis not present

## 2022-02-27 LAB — CBC WITH DIFFERENTIAL/PLATELET
Abs Immature Granulocytes: 0.02 10*3/uL (ref 0.00–0.07)
Basophils Absolute: 0.1 10*3/uL (ref 0.0–0.1)
Basophils Relative: 1 %
Eosinophils Absolute: 0.1 10*3/uL (ref 0.0–0.5)
Eosinophils Relative: 2 %
HCT: 38.1 % (ref 36.0–46.0)
Hemoglobin: 12.2 g/dL (ref 12.0–15.0)
Immature Granulocytes: 0 %
Lymphocytes Relative: 32 %
Lymphs Abs: 1.5 10*3/uL (ref 0.7–4.0)
MCH: 27.8 pg (ref 26.0–34.0)
MCHC: 32 g/dL (ref 30.0–36.0)
MCV: 86.8 fL (ref 80.0–100.0)
Monocytes Absolute: 0.7 10*3/uL (ref 0.1–1.0)
Monocytes Relative: 14 %
Neutro Abs: 2.3 10*3/uL (ref 1.7–7.7)
Neutrophils Relative %: 51 %
Platelets: 109 10*3/uL — ABNORMAL LOW (ref 150–400)
RBC: 4.39 MIL/uL (ref 3.87–5.11)
RDW: 14.1 % (ref 11.5–15.5)
WBC: 4.6 10*3/uL (ref 4.0–10.5)
nRBC: 0 % (ref 0.0–0.2)

## 2022-02-27 LAB — COMPREHENSIVE METABOLIC PANEL
ALT: 44 U/L (ref 0–44)
AST: 48 U/L — ABNORMAL HIGH (ref 15–41)
Albumin: 2.4 g/dL — ABNORMAL LOW (ref 3.5–5.0)
Alkaline Phosphatase: 23 U/L — ABNORMAL LOW (ref 38–126)
Anion gap: 7 (ref 5–15)
BUN: 14 mg/dL (ref 6–20)
CO2: 24 mmol/L (ref 22–32)
Calcium: 7.7 mg/dL — ABNORMAL LOW (ref 8.9–10.3)
Chloride: 108 mmol/L (ref 98–111)
Creatinine, Ser: 0.81 mg/dL (ref 0.44–1.00)
GFR, Estimated: >60 mL/min (ref >60)
Glucose, Bld: 138 mg/dL — ABNORMAL HIGH (ref 70–99)
Potassium: 3.7 mmol/L (ref 3.5–5.1)
Sodium: 139 mmol/L (ref 135–145)
Total Bilirubin: 0.5 mg/dL (ref 0.3–1.2)
Total Protein: 5.3 g/dL — ABNORMAL LOW (ref 6.5–8.1)

## 2022-02-27 LAB — URINE CULTURE: Culture: >=100000 — AB

## 2022-02-27 LAB — MAGNESIUM: Magnesium: 2.2 mg/dL (ref 1.7–2.4)

## 2022-02-27 MED ORDER — CEPHALEXIN 500 MG PO CAPS: 500.0000 mg | ORAL_CAPSULE | Freq: Three times a day (TID) | ORAL | 0 refills | Status: AC

## 2022-02-27 NOTE — Plan of Care (Signed)
  Problem: Education: Goal: Knowledge of General Education information will improve Description: Including pain rating scale, medication(s)/side effects and non-pharmacologic comfort measures Outcome: Adequate for Discharge   Problem: Health Behavior/Discharge Planning: Goal: Ability to manage health-related needs will improve Outcome: Adequate for Discharge   Problem: Clinical Measurements: Goal: Ability to maintain clinical measurements within normal limits will improve Outcome: Adequate for Discharge Goal: Will remain free from infection Outcome: Adequate for Discharge Goal: Diagnostic test results will improve Outcome: Adequate for Discharge Goal: Respiratory complications will improve Outcome: Adequate for Discharge Goal: Cardiovascular complication will be avoided Outcome: Adequate for Discharge   Problem: Nutrition: Goal: Adequate nutrition will be maintained Outcome: Adequate for Discharge   Problem: Coping: Goal: Level of anxiety will decrease Outcome: Adequate for Discharge

## 2022-02-27 NOTE — Progress Notes (Signed)
Discharge instructions reviewed with pt and her husband.  Copy of instructions given to pt. Informed that her antibiotic script was sent to her pharmacy for pick up. Answered questions for pt.   Pt to d/c'd via wheelchair with belongings, with her husband.          To be escorted by staff.

## 2022-02-27 NOTE — Discharge Summary (Addendum)
Physician Discharge Summary  [name] PYP:[id number] DOB: 23-Mar-1965 DOA: 02/25/2022  PCP: [name], MD  Admit date: 02/25/2022 Discharge date: 02/27/2022  Admitted From: home Disposition:  home  Recommendations for Outpatient Follow-up:  Follow up with PCP in 1-2 weeks  Home Health: none Equipment/Devices: none  Discharge Condition: stable CODE STATUS: Full code Diet Orders (From admission, onward)     Start     Ordered   02/26/22 0758  Diet Carb Modified Fluid consistency: Thin; Room service appropriate? Yes  Diet effective now       Comments: No Pork  Question Answer Comment  Diet-HS Snack? Nothing   Calorie Level Medium 1600-2000   Fluid consistency: Thin   Room service appropriate? Yes      02/26/22 0758            HPI: Per admitting MD, 57 year old woman who presented with septic shock from presumed urinary source. Came via EMS today because of increased dyspnea.  Fever and chills since 12/28 with increased urinary frequency and hematuria.  History of prior UTI's, no gallstones.   Hospital Course / Discharge diagnoses: Principal problem Septic shock due to UTI-she was admitted to the hospital, initially in the ICU, and I think she was very briefly on pressors but eventually responded to fluid.  She was placed on ceftriaxone.  Urine cultures speciated pansensitive E. coli.  With treatment, clinically has improved, was transferred to the floor and has remained stable.  Fever/chills has resolved.  She has no abdominal pain, no nausea or vomiting and able to tolerate a regular diet.  She is ambulating in the room without difficulties.  Given resolution of her symptoms, she will be converted to oral antibiotics and discharged home in stable condition for 8 additional days to complete a total of a 10-day course  Active problems Chronic systolic CHF, nonischemic cardiomyopathy-seeing cardiology as an outpatient.  Now that her blood pressure is normalized and septic  shock has resolved, resume home medications upon discharge Leukopenia-RVP was checked and it was negative Ambulatory dysfunction-likely due to #1, did really well with PT and currently ambulatory DM2-resume home medications Obesity, morbid-BMI 55, she would benefit from weight loss Irregular menses, complex ovarian cysts-follow-up with OB/GYN as an outpatient  Discharge Instructions   Allergies as of 02/27/2022       Reactions   Fluoxetine Other (See Comments)   More depressed   Cymbalta [duloxetine Hcl] Other (See Comments)   depressed   Penicillins    REACTION: rash Has patient had a PCN reaction causing immediate rash, facial/tongue/throat swelling, SOB or lightheadedness with hypotension: YES Has patient had a PCN reaction causing severe rash involving mucus membranes or skin necrosis: NO Has patient had a PCN reaction that required hospitalization: YES Has patient had a PCN reaction occurring within the last 10 years: NO If all of the above answers are "NO", then may proceed with Cephalosporin use.        Medication List     STOP taking these medications    atomoxetine 18 MG capsule Commonly known as: STRATTERA   fluticasone 50 MCG/ACT nasal spray Commonly known as: FLONASE   linaclotide 145 MCG Caps capsule Commonly known as: Linzess   nystatin powder Commonly known as: MYCOSTATIN/NYSTOP   vortioxetine HBr 20 MG Tabs tablet Commonly known as: Trintellix       TAKE these medications    carvedilol 25 MG tablet Commonly known as: COREG Take 1 tablet (25 mg total) by  mouth 2 (two) times daily with a meal.   cephALEXin 500 MG capsule Commonly known as: KEFLEX Take 1 capsule (500 mg total) by mouth 3 (three) times daily for 8 days.   furosemide 80 MG tablet Commonly known as: LASIX Take 1 tablet (80 mg total) by mouth daily.   potassium chloride SA 20 MEQ tablet Commonly known as: Klor-Con M20 Take 2 tablets (40 mEq total) by mouth daily.    sacubitril-valsartan 97-103 MG Commonly known as: ENTRESTO Take 1 tablet by mouth 2 (two) times daily.   simvastatin 20 MG tablet Commonly known as: ZOCOR Take 1 tablet (20 mg total) by mouth daily.   Xigduo XR 11-998 MG Tb24 Generic drug: Dapagliflozin Pro-metFORMIN ER TAKE 1 TABLET BY MOUTH EVERY DAY        Follow-up Information     [name], MD Follow up.   Specialty: Internal Medicine Contact information: [place] [address] [phone]         Salvadore Dom, MD .   Specialty: Obstetrics and Gynecology Contact information: [place] [address] [phone]                Consultations: PCCM  Procedures/Studies:  CT RENAL STONE STUDY  Result Date: 02/25/2022 CLINICAL DATA:  Abdominal pain, flank pain, shortness of breath EXAM: CT ABDOMEN AND PELVIS WITHOUT CONTRAST TECHNIQUE: Multidetector CT imaging of the abdomen and pelvis was performed following the standard protocol without IV contrast. RADIATION DOSE REDUCTION: This exam was performed according to the departmental dose-optimization program which includes automated exposure control, adjustment of the mA and/or kV according to patient size and/or use of iterative reconstruction technique. COMPARISON:  07/28/2017 FINDINGS: Lower chest: No acute abnormality. Hepatobiliary: No focal liver abnormality is seen. Hepatomegaly, maximum coronal span 21.8 cm. Hepatic steatosis. Status post cholecystectomy. No biliary dilatation. Pancreas: Unremarkable. No pancreatic ductal dilatation or surrounding inflammatory changes. Spleen: Normal in size without significant abnormality. Adrenals/Urinary Tract: Adrenal glands are unremarkable. Kidneys are normal, without renal calculi, solid lesion, or hydronephrosis. Bladder is unremarkable. Stomach/Bowel: Stomach is within normal limits. Appendix appears normal. No evidence of bowel wall thickening, distention, or  inflammatory changes. Vascular/Lymphatic: Aortic atherosclerosis. No enlarged abdominal or pelvic lymph nodes. Reproductive: No mass. Somewhat irregular and complex appearing bilateral fluid attenuation ovarian lesions, measuring up 2.7 x 1.6 cm on left (series 3, image 69) and 2.4 x 2.1 cm on the right (series 3, image 73). Other: No abdominal wall hernia or abnormality. No ascites. Musculoskeletal: No acute or significant osseous findings. IMPRESSION: 1. No acute noncontrast CT findings of the abdomen or pelvis to explain abdominal pain. No urinary tract calculi or hydronephrosis. 2. Hepatomegaly and hepatic steatosis. 3. Somewhat irregular and complex appearing bilateral fluid attenuation ovarian lesions, measuring up to 2.7 x 1.6 cm on left and 2.4 x 2.1 cm on the right. These are most likely benign ovarian cysts, however given appearance of complexity and in the postmenopausal setting, recommend dedicated pelvic ultrasound to further evaluate on a nonemergent, outpatient basis. Aortic Atherosclerosis (ICD10-I70.0). Electronically Signed   By: Delanna Ahmadi M.D.   On: 02/25/2022 14:21   [place] EKG SITE RITE  Result Date: 02/25/2022 If Site Rite image not attached, placement could not be confirmed due to current cardiac rhythm.  DG Chest 2 View  Result Date: 02/25/2022 CLINICAL DATA:  Fevers and short of breath. EXAM: CHEST - 2 VIEW COMPARISON:  06/21/2020 FINDINGS: Cardiomediastinal contours are unremarkable. There  is no pleural effusion or edema. No airspace opacities identified. Scoliosis deformity noted involving the thoracic spine. IMPRESSION: No active cardiopulmonary abnormalities. Electronically Signed   By: Kerby Moors M.D.   On: 02/25/2022 08:25     Subjective: - no chest pain, shortness of breath, no abdominal pain, nausea or vomiting.   Discharge Exam: BP 127/79   Pulse 71   Temp 97.8 F (36.6 C) (Oral)   Resp 16   Ht '5\' 4"'$  (1.626 m)   Wt (!) 147.1 kg   SpO2 98%   BMI 55.67 kg/m    General: Pt is alert, awake, not in acute distress Cardiovascular: RRR, S1/S2 +, no rubs, no gallops Respiratory: CTA bilaterally, no wheezing, no rhonchi Abdominal: Soft, NT, ND, bowel sounds + Extremities: no edema, no cyanosis    The results of significant diagnostics from this hospitalization (including imaging, microbiology, ancillary and laboratory) are listed below for reference.     Microbiology: Recent Results (from the past 240 hour(s))  Resp panel by RT-PCR (RSV, Flu A&B, Covid) Anterior Nasal Swab     Status: None   Collection Time: 02/25/22  7:51 AM   Specimen: Anterior Nasal Swab  Result Value Ref Range Status   SARS Coronavirus 2 by RT PCR NEGATIVE NEGATIVE Final    Comment: (NOTE) SARS-CoV-2 target nucleic acids are NOT DETECTED.  The SARS-CoV-2 RNA is generally detectable in upper respiratory specimens during the acute phase of infection. The lowest concentration of SARS-CoV-2 viral copies this assay can detect is 138 copies/mL. A negative result does not preclude SARS-Cov-2 infection and should not be used as the sole basis for treatment or other patient management decisions. A negative result may occur with  improper specimen collection/handling, submission of specimen other than nasopharyngeal swab, presence of viral mutation(s) within the areas targeted by this assay, and inadequate number of viral copies(<138 copies/mL). A negative result must be combined with clinical observations, patient history, and epidemiological information. The expected result is Negative.  Fact Sheet for Patients:  EntrepreneurPulse.com.au  Fact Sheet for Healthcare Providers:  IncredibleEmployment.be  This test is no t yet approved or cleared by the [place] FDA and  has been authorized for detection and/or diagnosis of SARS-CoV-2 by FDA under an Emergency Use Authorization (EUA). This EUA will remain  in effect (meaning this test  can be used) for the duration of the COVID-19 declaration under Section 564(b)(1) of the Act, 21 U.S.C.section 360bbb-3(b)(1), unless the authorization is terminated  or revoked sooner.       Influenza A by PCR NEGATIVE NEGATIVE Final   Influenza B by PCR NEGATIVE NEGATIVE Final    Comment: (NOTE) The Xpert Xpress SARS-CoV-2/FLU/RSV plus assay is intended as an aid in the diagnosis of influenza from Nasopharyngeal swab specimens and should not be used as a sole basis for treatment. Nasal washings and aspirates are unacceptable for Xpert Xpress SARS-CoV-2/FLU/RSV testing.  Fact Sheet for Patients: EntrepreneurPulse.com.au  Fact Sheet for Healthcare Providers: IncredibleEmployment.be  This test is not yet approved or cleared by the [place] FDA and has been authorized for detection and/or diagnosis of SARS-CoV-2 by FDA under an Emergency Use Authorization (EUA). This EUA will remain in effect (meaning this test can be used) for the duration of the COVID-19 declaration under Section 564(b)(1) of the Act, 21 U.S.C. section 360bbb-3(b)(1), unless the authorization is terminated or revoked.     Resp Syncytial Virus by PCR NEGATIVE NEGATIVE Final    Comment: (NOTE) Fact Sheet for Patients:  EntrepreneurPulse.com.au  Fact Sheet for Healthcare Providers: IncredibleEmployment.be  This test is not yet approved or cleared by the [place] FDA and has been authorized for detection and/or diagnosis of SARS-CoV-2 by FDA under an Emergency Use Authorization (EUA). This EUA will remain in effect (meaning this test can be used) for the duration of the COVID-19 declaration under Section 564(b)(1) of the Act, 21 U.S.C. section 360bbb-3(b)(1), unless the authorization is terminated or revoked.  Performed at [facility], [place] [address] [place], [place] [address]   Blood Culture (routine x 2)     Status:  None (Preliminary result)   Collection Time: 02/25/22  9:39 AM   Specimen: BLOOD LEFT ARM  Result Value Ref Range Status   Specimen Description BLOOD LEFT ARM  Final   Special Requests   Final    BOTTLES DRAWN AEROBIC AND ANAEROBIC Blood Culture adequate volume   Culture   Final    NO GROWTH 2 DAYS Performed at [place] [address], [place], [place] [address]    Report Status PENDING  Incomplete  Urine Culture     Status: Abnormal   Collection Time: 02/25/22  9:39 AM   Specimen: In/Out Cath Urine  Result Value Ref Range Status   Specimen Description IN/OUT CATH URINE  Final   Special Requests   Final    NONE Performed at [facility], [place] [address] [place], [place] [address]    Culture >=100,000 COLONIES/mL ESCHERICHIA COLI (A)  Final   Report Status 02/27/2022 FINAL  Final   Organism ID, Bacteria ESCHERICHIA COLI (A)  Final      Susceptibility   Escherichia coli - MIC*    AMPICILLIN 4 SENSITIVE Sensitive     CEFAZOLIN <=4 SENSITIVE Sensitive     CEFEPIME <=0.12 SENSITIVE Sensitive     CEFTRIAXONE <=0.25 SENSITIVE Sensitive     CIPROFLOXACIN <=0.25 SENSITIVE Sensitive     GENTAMICIN <=1 SENSITIVE Sensitive     IMIPENEM <=0.25 SENSITIVE Sensitive     NITROFURANTOIN <=16 SENSITIVE Sensitive     TRIMETH/SULFA <=20 SENSITIVE Sensitive     AMPICILLIN/SULBACTAM <=2 SENSITIVE Sensitive     PIP/TAZO <=4 SENSITIVE Sensitive     * >=100,000 COLONIES/mL ESCHERICHIA COLI  Blood Culture (routine x 2)     Status: None (Preliminary result)   Collection Time: 02/25/22 10:23 AM   Specimen: BLOOD  Result Value Ref Range Status   Specimen Description BLOOD RIGHT ANTECUBITAL  Final   Special Requests   Final    BOTTLES DRAWN AEROBIC AND ANAEROBIC Blood Culture adequate volume   Culture   Final    NO GROWTH 2 DAYS Performed at [facility], [address], [place], [place] [address]    Report Status PENDING  Incomplete  MRSA Next Gen by PCR, Nasal     Status:  None   Collection Time: 02/25/22  3:04 PM   Specimen: Nasal Mucosa; Nasal Swab  Result Value Ref Range Status   MRSA by PCR Next Gen NOT DETECTED NOT DETECTED Final    Comment: (NOTE) The GeneXpert MRSA Assay (FDA approved for NASAL specimens only), is one component of a comprehensive MRSA colonization surveillance program. It is not intended to diagnose MRSA infection nor to guide or monitor treatment for MRSA infections. Test performance is not FDA approved in patients less than 63 years old. Performed at [facility], [place] [address], [place], [place] [address]   Respiratory (~20 pathogens)  panel by PCR     Status: None   Collection Time: 02/26/22  9:37 AM   Specimen: Nasopharyngeal Swab; Respiratory  Result Value Ref Range Status   Adenovirus NOT DETECTED NOT DETECTED Final   Coronavirus 229E NOT DETECTED NOT DETECTED Final    Comment: (NOTE) The Coronavirus on the Respiratory Panel, DOES NOT test for the novel  Coronavirus (2019 nCoV)    Coronavirus HKU1 NOT DETECTED NOT DETECTED Final   Coronavirus NL63 NOT DETECTED NOT DETECTED Final   Coronavirus OC43 NOT DETECTED NOT DETECTED Final   Metapneumovirus NOT DETECTED NOT DETECTED Final   Rhinovirus / Enterovirus NOT DETECTED NOT DETECTED Final   Influenza A NOT DETECTED NOT DETECTED Final   Influenza B NOT DETECTED NOT DETECTED Final   Parainfluenza Virus 1 NOT DETECTED NOT DETECTED Final   Parainfluenza Virus 2 NOT DETECTED NOT DETECTED Final   Parainfluenza Virus 3 NOT DETECTED NOT DETECTED Final   Parainfluenza Virus 4 NOT DETECTED NOT DETECTED Final   Respiratory Syncytial Virus NOT DETECTED NOT DETECTED Final   Bordetella pertussis NOT DETECTED NOT DETECTED Final   Bordetella Parapertussis NOT DETECTED NOT DETECTED Final   Chlamydophila pneumoniae NOT DETECTED NOT DETECTED Final   Mycoplasma pneumoniae NOT DETECTED NOT DETECTED Final    Comment: Performed at [facility], [place]. [address] [place],  [place] [address]     Labs: Basic Metabolic Panel: Recent Labs  Lab 02/25/22 0802 02/25/22 0939 02/25/22 1649 02/26/22 0609 02/27/22 0316  NA 141 140  --  138 139  K 3.4* 3.1*  --  3.3* 3.7  CL 102 102  --  108 108  CO2 24 26  --  23 24  GLUCOSE 163* 194*  --  109* 138*  BUN 17 20  --  16 14  CREATININE 1.01* 1.25*  --  0.96 0.81  CALCIUM 8.8* 8.7*  --  7.8* 7.7*  MG  --   --  1.6*  --  2.2   Liver Function Tests: Recent Labs  Lab 02/25/22 0802 02/25/22 0939 02/26/22 0609 02/27/22 0316  AST 51* 51* 50* 48*  ALT 42 44 46* 44  ALKPHOS 33* 30* 19* 23*  BILITOT 1.4* 1.5* 0.9 0.5  PROT 6.4* 6.3* 4.9* 5.3*  ALBUMIN 3.0* 3.0* 2.3* 2.4*   CBC: Recent Labs  Lab 02/25/22 0802 02/25/22 0939 02/26/22 0609 02/27/22 0316  WBC 3.1* 4.5 5.8 4.6  NEUTROABS 2.7 4.2 4.4 2.3  HGB 15.8* 15.1* 12.6 12.2  HCT 48.2* 46.4* 39.5 38.1  MCV 86.1 86.4 87.4 86.8  PLT 128* 127* 109* 109*   CBG: Recent Labs  Lab 02/25/22 1426 02/25/22 2004 02/25/22 2311 02/26/22 0333 02/26/22 0826  GLUCAP 153* 156* 102* 106* 133*   Hgb A1c No results for input(s): "HGBA1C" in the last 72 hours. Lipid Profile No results for input(s): "CHOL", "HDL", "LDLCALC", "TRIG", "CHOLHDL", "LDLDIRECT" in the last 72 hours. Thyroid function studies No results for input(s): "TSH", "T4TOTAL", "T3FREE", "THYROIDAB" in the last 72 hours.  Invalid input(s): "FREET3" Urinalysis    Component Value Date/Time   COLORURINE AMBER (A) 02/25/2022 0939   APPEARANCEUR CLOUDY (A) 02/25/2022 0939   LABSPEC 1.018 02/25/2022 0939   PHURINE 5.0 02/25/2022 0939   GLUCOSEU >=500 (A) 02/25/2022 0939   GLUCOSEU 100 (A) 02/07/2022 1420   HGBUR MODERATE (A) 02/25/2022 [place] NEGATIVE 02/25/2022 0939   BILIRUBINUR negative 09/09/2017 [place] 02/25/2022 0939   PROTEINUR >=300 (A) 02/25/2022 5277  UROBILINOGEN 0.2 02/07/2022 1420   NITRITE NEGATIVE 02/25/2022 0939   LEUKOCYTESUR SMALL (A) 02/25/2022  0939    FURTHER DISCHARGE INSTRUCTIONS:   Get Medicines reviewed and adjusted: Please take all your medications with you for your next visit with your Primary MD   Laboratory/radiological data: Please request your Primary MD to go over all hospital tests and procedure/radiological results at the follow up, please ask your Primary MD to get all Hospital records sent to his/her office.   In some cases, they will be blood work, cultures and biopsy results pending at the time of your discharge. Please request that your primary care M.D. goes through all the records of your hospital data and follows up on these results.   Also Note the following: If you experience worsening of your admission symptoms, develop shortness of breath, life threatening emergency, suicidal or homicidal thoughts you must seek medical attention immediately by calling 911 or calling your MD immediately  if symptoms less severe.   You must read complete instructions/literature along with all the possible adverse reactions/side effects for all the Medicines you take and that have been prescribed to you. Take any new Medicines after you have completely understood and accpet all the possible adverse reactions/side effects.    Do not drive when taking Pain medications or sleeping medications (Benzodaizepines)   Do not take more than prescribed Pain, Sleep and Anxiety Medications. It is not advisable to combine anxiety,sleep and pain medications without talking with your primary care practitioner   Special Instructions: If you have smoked or chewed Tobacco  in the last 2 yrs please stop smoking, stop any regular Alcohol  and or any Recreational drug use.   Wear Seat belts while driving.   Please note: You were cared for by a hospitalist during your hospital stay. Once you are discharged, your primary care physician will handle any further medical issues. Please note that NO REFILLS for any discharge medications will be  authorized once you are discharged, as it is imperative that you return to your primary care physician (or establish a relationship with a primary care physician if you do not have one) for your post hospital discharge needs so that they can reassess your need for medications and monitor your lab values.  Time coordinating discharge: 40 minutes  SIGNED:  [name], MD, PhD 02/27/2022, 10:21 AM

## 2022-02-27 NOTE — Discharge Instructions (Signed)
Follow with Samantha Lima, MD in 5-7 days  Follow up with ObGyn as an outpatient in 2-4 weeks  Please get a complete blood count and chemistry panel checked by your Primary MD at your next visit, and again as instructed by your Primary MD. Please get your medications reviewed and adjusted by your Primary MD.  Please request your Primary MD to go over all Hospital Tests and Procedure/Radiological results at the follow up, please get all Hospital records sent to your Prim MD by signing hospital release before you go home.  In some cases, there will be blood work, cultures and biopsy results pending at the time of your discharge. Please request that your primary care M.D. goes through all the records of your hospital data and follows up on these results.  If you had Pneumonia of Lung problems at the Hospital: Please get a 2 view Chest X ray done in 6-8 weeks after hospital discharge or sooner if instructed by your Primary MD.  If you have Congestive Heart Failure: Please call your Cardiologist or Primary MD anytime you have any of the following symptoms:  1) 3 pound weight gain in 24 hours or 5 pounds in 1 week  2) shortness of breath, with or without a dry hacking cough  3) swelling in the hands, feet or stomach  4) if you have to sleep on extra pillows at night in order to breathe  Follow cardiac low salt diet and 1.5 lit/day fluid restriction.  If you have diabetes Accuchecks 4 times/day, Once in AM empty stomach and then before each meal. Log in all results and show them to your primary doctor at your next visit. If any glucose reading is under 80 or above 300 call your primary MD immediately.  If you have Seizure/Convulsions/Epilepsy: Please do not drive, operate heavy machinery, participate in activities at heights or participate in high speed sports until you have seen by Primary MD or a Neurologist and advised to do so again. Per [facility] statutes, patients with seizures  are not allowed to drive until they have been seizure-free for six months.  Use caution when using heavy equipment or power tools. Avoid working on ladders or at heights. Take showers instead of baths. Ensure the water temperature is not too high on the home water heater. Do not go swimming alone. Do not lock yourself in a room alone (i.e. bathroom). When caring for infants or small children, sit down when holding, feeding, or changing them to minimize risk of injury to the child in the event you have a seizure. Maintain good sleep hygiene. Avoid alcohol.   If you had Gastrointestinal Bleeding: Please ask your Primary MD to check a complete blood count within one week of discharge or at your next visit. Your endoscopic/colonoscopic biopsies that are pending at the time of discharge, will also need to followed by your Primary MD.  Get Medicines reviewed and adjusted. Please take all your medications with you for your next visit with your Primary MD  Please request your Primary MD to go over all hospital tests and procedure/radiological results at the follow up, please ask your Primary MD to get all Hospital records sent to his/her office.  If you experience worsening of your admission symptoms, develop shortness of breath, life threatening emergency, suicidal or homicidal thoughts you must seek medical attention immediately by calling 911 or calling your MD immediately  if symptoms less severe.  You must read complete instructions/literature along with  all the possible adverse reactions/side effects for all the Medicines you take and that have been prescribed to you. Take any new Medicines after you have completely understood and accpet all the possible adverse reactions/side effects.   Do not drive or operate heavy machinery when taking Pain medications.   Do not take more than prescribed Pain, Sleep and Anxiety Medications  Special Instructions: If you have smoked or chewed Tobacco  in the last 2  yrs please stop smoking, stop any regular Alcohol  and or any Recreational drug use.  Wear Seat belts while driving.  Please note You were cared for by a hospitalist during your hospital stay. If you have any questions about your discharge medications or the care you received while you were in the hospital after you are discharged, you can call the unit and asked to speak with the hospitalist on call if the hospitalist that took care of you is not available. Once you are discharged, your primary care physician will handle any further medical issues. Please note that NO REFILLS for any discharge medications will be authorized once you are discharged, as it is imperative that you return to your primary care physician (or establish a relationship with a primary care physician if you do not have one) for your aftercare needs so that they can reassess your need for medications and monitor your lab values.  You can reach the hospitalist office at phone [phone] or fax [phone]   If you do not have a primary care physician, you can call [phone] for a physician referral.  Activity: As tolerated with Full fall precautions use walker/cane & assistance as needed    Diet: low sodium  Disposition Home

## 2022-02-28 ENCOUNTER — Encounter: Payer: Self-pay | Admitting: *Deleted

## 2022-02-28 ENCOUNTER — Telehealth: Payer: Self-pay | Admitting: *Deleted

## 2022-02-28 DIAGNOSIS — I5022 Chronic systolic (congestive) heart failure: Secondary | ICD-10-CM

## 2022-02-28 NOTE — Patient Outreach (Signed)
Care Coordination TOC Note Transition Care Management Follow-up Telephone Call Date of discharge and from where: Wednesday, 02/27/22, [place]; septic shock from presumable UTI; dyspnea How have you been since you were released from the hospital? Per husband/ caregiver [name], on [facility] DPR: "She is doing okay; she is lying down asleep right now.  I am her caregiver, I had to quit working a long time ago to take care of her; I handle all of her medicine and make sure she has something to eat, help her with her showers, take her to her appointments.  We get $ 200 in food stamps a month and could use more food, so yes please have them call me to give me some resources for food pantries.  I would like to talk to the doctor about getting her one of the scanning blood sugar machines so she doesn't have to prick her fingers all the time.  Please have them call me so we can get an appointment scheduled." Any questions or concerns? Yes 1) concerns with food insecurity: referral placed with [place] team/ [phone], for provision of resources for food acquisition- explained to caregiver that care guide will provide resources and it will then be his responsibility to follow up to access/ use resources- he verbalizes understanding of same 2) Provided education around general insurance requirements for coverage of CGM and encouraged husband to discuss this option with PCP at time of next scheduled office visit 3) No hospital follow up visit scheduled with PCP: requested facilitation of scheduling this appointment from scheduling care guide team; encouraged caregiver to keep phone nearby and listen out for phone call; provided education around importance of making/ attending prompt hospital follow up PCP appointment  Items Reviewed: Did the pt receive and understand the discharge instructions provided? Yes - thoroughly reviewed with caregiver today Medications obtained and verified? Yes  full  medication review completed with caregiver; no discrepancies or concerns identified; confirmed patient has obtained and is taking antibiotics as prescribed post-hospital discharge; confirmed caregiver/ spouse manages all aspects of medication administration Other? No  Any new allergies since your discharge? No  Dietary orders reviewed? Yes Do you have support at home? Yes  husband reports he is patient's primary caregiver and assists with all daily care needs  Home Care and Equipment/Supplies: Were home health services ordered? no If so, what is the name of the agency? N/A  Has the agency set up a time to come to the patient's home? not applicable Were any new equipment or medical supplies ordered?  No What is the name of the medical supply agency? N/A Were you able to get the supplies/equipment? not applicable Do you have any questions related to the use of the equipment or supplies? No N/A  Functional Questionnaire: (I = Independent and D = Dependent) ADLs: D  husband assists as indicated/ needed  Bathing/Dressing- D husband assists as indicated/ needed  Meal Prep- D  Eating- I  Maintaining continence- I  Transferring/Ambulation- D husband assists as indicated/ needed   Managing Meds- D  Follow up appointments reviewed:  PCP Hospital f/u appt confirmed? No  Scheduled to see - on - @ - requested facilitating of scheduling with scheduling care guide team Specialist Hospital f/u appt confirmed? No  Scheduled to see - on - @ - Are transportation arrangements needed? No  If their condition worsens, is the pt aware to call PCP or go to the Emergency Dept.? Yes Was the patient provided with contact  information for the PCP's office or ED? No- husband declined- reports already has contact information for all care providers Was to pt encouraged to call back with questions or concerns? Yes  SDOH assessments and interventions completed:   Yes SDOH Interventions Today    Flowsheet Row  Most Recent Value  SDOH Interventions   Food Insecurity Interventions Other (Comment)  [husband reports gets $200/ month in food stamps,  Edith Endave referral placed]  Transportation Interventions Intervention Not Indicated  [husband provides transportation]      Care Coordination Interventions:  PCP follow up appointment requested Placed referral for [employer] outreach to provide resources for food acquisition; provided education around general requirements for insurance coverage of CGM; provided education/ reinforced dietary recommendations for DMII/ heart healthy/ low salt diet    Encounter Outcome:  Pt. Visit Completed    Oneta Rack, RN, BSN, CCRN Alumnus RN CM Care Coordination/ Transition of [place] Management [phone]: direct office

## 2022-03-01 ENCOUNTER — Telehealth: Payer: Self-pay | Admitting: *Deleted

## 2022-03-01 NOTE — Progress Notes (Signed)
  Care Coordination  Note  03/01/2022 Name: [name] MRN: [id number] DOB: 12-22-1965  [name] is a 57 y.o. year old primary care patient of [name], MD. I reached out to [name] by phone today to assist with scheduling a follow up appointment. [name] verbally consented to my assistance.       Follow up plan: Hospital Follow Up appointment scheduled with (Dr. Ronnald Ramp) on (03/06/22) at (10:00 AM).  [place]  Direct Dial: [phone]

## 2022-03-01 NOTE — Telephone Encounter (Signed)
   Telephone encounter was:  Successful.  03/01/2022 Name: [name] MRN: [id number] DOB: 1965-09-21  [name] is a 57 y.o. year old female who is a primary care patient of [name], MD . The community resource team was consulted for assistance with Food Insecurity and [place] guide performed the following interventions: Patient provided with information about care guide support team and interviewed to confirm resource needs.Provided [place] cares 360 referral for food also provided app [place] and mailed information on food banks  Also care giver stress information as well  Follow Up Plan:  No further follow up planned at this time. The patient has been provided with needed resources.  [place] [phone] [address] , [place] [address] Email : [name]

## 2022-03-03 LAB — CULTURE, BLOOD (ROUTINE X 2)
Culture: NO GROWTH
Culture: NO GROWTH
Special Requests: ADEQUATE
Special Requests: ADEQUATE

## 2022-03-05 ENCOUNTER — Ambulatory Visit: Payer: Self-pay | Admitting: Licensed Clinical Social Worker

## 2022-03-05 ENCOUNTER — Telehealth (HOSPITAL_COMMUNITY): Payer: Self-pay | Admitting: Psychiatry

## 2022-03-05 NOTE — Telephone Encounter (Signed)
D:  Dr. Parke Poisson referred pt to [facility].  A:  Placed call to orient pt but there was no answer.  Left vm with case manager's name and phone number.  Inform Dr. Parke Poisson and Tammy Sours, LPN.

## 2022-03-05 NOTE — Patient Instructions (Signed)
Visit Information  Thank you for taking time to visit with me today. Please don't hesitate to contact me if I can be of assistance to you.   Following are the goals we discussed today:   Goals Addressed             This Visit's Progress    Care Coordination Activities for mental health support       Care Coordination Interventions:   Solution-Focused Strategies employed:  Active listening / Reflection utilized  Emotional Support Provided Problem [place] strategies reviewed Provided general psycho-education for mental health needs  Collaborated with Rita at [facility] IOP refer. Referral placed by Dr. Parke Poisson Discussed mental health treatment options :  Patient is ambivalent with IOP program  Patient missed appointment with [place] scheduled Jan. 3rd 4:45 due to being in the hospital  Discussed other needs: dentist and connecting with OBGYN: Discussed options and provided contact information Starr Lake, DMD [place], [place] [address] [phone]  Dr. Carmelia Roller [address], [place] [address]  [place] for [facility] [place]           [place] next appointment is by telephone on 03/19/22   Please call the care guide team at [phone] if you need to cancel or reschedule your appointment.   If you are experiencing a Mental Health or [place] or need someone to talk to, please call the Suicide and Crisis Lifeline: 988 call the [place] National Suicide Prevention Lifeline: 570-668-3074 or TTY: 650-876-4377 TTY (929) 823-6528) to talk to a trained counselor call 1-800-273-TALK (toll free, 24 hour hotline) go to [facility] [address] [place] [phone])   Patient verbalizes understanding of instructions and care plan provided today and agrees to view in [place]. Active MyChart status and patient understanding of how  to access instructions and care plan via MyChart confirmed with patient.     [name], [place] [phone]

## 2022-03-05 NOTE — Patient Outreach (Signed)
  Care Coordination  Follow Up Visit Note   03/05/2022 Name: [name] MRN: [id number] DOB: 1965-08-28  [name] is a 57 y.o. year old female who sees [name], MD for primary care. I spoke with  [name] by phone today.  What matters to the patients health and wellness today?   Patient continues to experience difficulty with managing her mental health needs. Willing to talk to [facility] about IOP program.  Recommendation: Patient may benefit from, and is in agreement to :  Call to schedule appointment with OBGYN, Dentist and mental health provider.  All contact information provided during this encounter.   Goals Addressed             This Visit's Progress    Care Coordination Activities for mental health support       Care Coordination Interventions:   Solution-Focused Strategies employed:  Active listening / Reflection utilized  Emotional Support Provided Problem [place] strategies reviewed Provided general psycho-education for mental health needs  Collaborated with Rita at [facility] IOP refer. Referral placed by Dr. Parke Poisson Discussed mental health treatment options :  Patient is ambivalent with IOP program  Patient missed appointment with [place] scheduled Jan. 3rd 4:45 due to being in the hospital  Discussed other needs: dentist and connecting with OBGYN: Discussed options and provided contact information Starr Lake, DMD [place], [place] [address] [phone]  Dr. Carmelia Roller [address], [place] [address]  [phone]  [facility] [place]           SDOH assessments and interventions completed:  No   Care Coordination Interventions:  Yes, provided   Follow up plan: Follow up call scheduled for 03/19/22    Encounter Outcome:  Pt. Visit Completed   [name], [place] [phone]

## 2022-03-06 ENCOUNTER — Ambulatory Visit (INDEPENDENT_AMBULATORY_CARE_PROVIDER_SITE_OTHER): Payer: Medicare Other | Admitting: Internal Medicine

## 2022-03-06 ENCOUNTER — Encounter: Payer: Self-pay | Admitting: Internal Medicine

## 2022-03-06 VITALS — BP 120/76 | HR 85 | Temp 97.6°F | Ht 64.0 in | Wt 300.0 lb

## 2022-03-06 DIAGNOSIS — N83202 Unspecified ovarian cyst, left side: Secondary | ICD-10-CM

## 2022-03-06 DIAGNOSIS — B3731 Acute candidiasis of vulva and vagina: Secondary | ICD-10-CM | POA: Diagnosis not present

## 2022-03-06 DIAGNOSIS — N3000 Acute cystitis without hematuria: Secondary | ICD-10-CM

## 2022-03-06 DIAGNOSIS — N83201 Unspecified ovarian cyst, right side: Secondary | ICD-10-CM | POA: Diagnosis not present

## 2022-03-06 DIAGNOSIS — E1165 Type 2 diabetes mellitus with hyperglycemia: Secondary | ICD-10-CM | POA: Diagnosis not present

## 2022-03-06 DIAGNOSIS — M170 Bilateral primary osteoarthritis of knee: Secondary | ICD-10-CM | POA: Diagnosis not present

## 2022-03-06 LAB — URINALYSIS, ROUTINE W REFLEX MICROSCOPIC
Bilirubin Urine: NEGATIVE
Hgb urine dipstick: NEGATIVE
Ketones, ur: NEGATIVE
Leukocytes,Ua: NEGATIVE
Nitrite: NEGATIVE
RBC / HPF: NONE SEEN (ref 0–?)
Specific Gravity, Urine: 1.025 (ref 1.000–1.030)
Total Protein, Urine: NEGATIVE
Urine Glucose: >=1000 — AB
Urobilinogen, UA: 0.2 (ref 0.0–1.0)
pH: 5.5 (ref 5.0–8.0)

## 2022-03-06 LAB — MICROALBUMIN / CREATININE URINE RATIO
Creatinine,U: 125.2 mg/dL
Microalb Creat Ratio: 1 mg/g (ref 0.0–30.0)
Microalb, Ur: 1.3 mg/dL (ref 0.0–1.9)

## 2022-03-06 MED ORDER — FLUCONAZOLE 150 MG PO TABS: 150.0000 mg | ORAL_TABLET | Freq: Once | ORAL | 3 refills | Status: AC

## 2022-03-06 MED ORDER — TRAMADOL HCL 50 MG PO TABS: 50.0000 mg | ORAL_TABLET | Freq: Four times a day (QID) | ORAL | 3 refills | Status: AC | PRN

## 2022-03-06 NOTE — Patient Instructions (Signed)
Ovarian Cyst  An ovarian cyst is a fluid-filled sac that forms on an ovary. The ovaries are small organs that produce eggs in women. Various types of cysts can form on the ovaries. Some may cause symptoms and require treatment. Most ovarian cysts go away on their own, are not cancerous (are benign), and do not cause problems. What are the causes? Ovarian cysts may be caused by: Ovarian hyperstimulation syndrome. This is a condition that can develop from taking fertility medicines. It causes multiple large ovarian cysts to form. Polycystic ovarian syndrome (PCOS). This is a common hormonal disorder that can cause ovarian cysts to form, and can cause problems with your period or fertility. The normal menstrual cycle. What increases the risk? The following factors may make you more likely to develop this condition: Being overweight or obese. Taking fertility medicines. Taking certain forms of hormonal birth control. Smoking. What are the signs or symptoms? Many ovarian cysts do not cause symptoms. If symptoms are present, they may include: Pelvic pain or pressure. Pain in the lower abdomen. Pain during sex. Abdominal swelling. Abnormal menstrual periods. Increasing pain with menstrual periods. How is this diagnosed? These cysts are commonly found during a routine pelvic exam. You may have tests to find out more about the cyst, such as: Ultrasound. CT scan. MRI. Blood tests. How is this treated? Many ovarian cysts go away on their own without treatment. Your health care provider may want to check your cyst regularly for 2-3 months to see if it changes. If you are in menopause, it is especially important to have your cyst monitored closely because menopausal women have a higher rate of ovarian cancer. When treatment is needed, it may include: Medicines to help relieve pain. A procedure to drain the cyst (aspiration). Surgery to remove the whole cyst (cystectomy). Hormone treatment or  birth control pills. These methods are sometimes used to help keep cysts from coming back. Surgery to remove the ovary (oophorectomy). Follow these instructions at home: Take over-the-counter and prescription medicines only as told by your health care provider. Ask your health care provider if any medicine prescribed to you requires you to avoid driving or using machinery. Get regular pelvic exams and Pap tests as often as told by your health care provider. Return to your normal activities as told by your health care provider. Ask your health care provider what activities are safe for you. Do not use any products that contain nicotine or tobacco, such as cigarettes, e-cigarettes, and chewing tobacco. If you need help quitting, ask your health care provider. Keep all follow-up visits. This is important. Contact a health care provider if: Your periods are late, irregular, painful, or they stop. You have pelvic pain that does not go away. You have pressure on your bladder or trouble emptying your bladder completely. You have any of the following: A feeling of fullness. You are gaining weight or losing weight without changing your exercise and eating habits. Pain, swelling, or bloating in the abdomen. Loss of appetite. Pain and pressure in your back and pelvis. You think you may be pregnant. Get help right away if: You have abdominal or pelvic pain that is severe or gets worse. You cannot eat or drink without vomiting. You suddenly develop a fever or chills. Your menstrual period is much heavier than usual. Summary An ovarian cyst is a fluid-filled sac that forms on an ovary. Some ovarian cysts may cause symptoms and require treatment. These cysts are commonly found during a routine pelvic   exam. Many ovarian cysts go away on their own without treatment. This information is not intended to replace advice given to you by your health care provider. Make sure you discuss any questions you have  with your health care provider. Document Revised: 07/22/2019 Document Reviewed: 07/22/2019 Elsevier Patient Education  2023 [employer]  

## 2022-03-06 NOTE — Progress Notes (Signed)
Subjective:  Patient ID: [name], female    DOB: 20-Sep-1965  Age: 57 y.o. MRN: [id number]  CC: Urinary Tract Infection, Osteoarthritis, Back Pain, Diabetes, and Congestive Heart Failure   HPI [name] presents for f/up -  She was recently admitted for urosepsis.  She has completed the antibacterials but now complains of vaginal itching.  She complains of chronic low back and knee pain.  She wants a refill of ibuprofen.  Admit date: 02/25/2022 Discharge date: 02/27/2022   Admitted From: home Disposition:  home   Recommendations for Outpatient Follow-up:  Follow up with PCP in 1-2 weeks   Home Health: none Equipment/Devices: none   Discharge Condition: stable CODE STATUS: Full code Diet Orders (From admission, onward)        Start     Ordered    02/26/22 0758   Diet Carb Modified Fluid consistency: Thin; Room service appropriate? Yes  Diet effective now       Comments: No Pork  Question Answer Comment  Diet-HS Snack? Nothing    Calorie Level Medium 1600-2000    Fluid consistency: Thin    Room service appropriate? Yes       02/26/22 0758                  HPI: Per admitting MD, 57 year old woman who presented with septic shock from presumed urinary source. Came via EMS today because of increased dyspnea.  Fever and chills since 12/28 with increased urinary frequency and hematuria.  History of prior UTI's, no gallstones.    Hospital Course / Discharge diagnoses: Principal problem Septic shock due to UTI-she was admitted to the hospital, initially in the ICU, and I think she was very briefly on pressors but eventually responded to fluid.  She was placed on ceftriaxone.  Urine cultures speciated pansensitive E. coli.  With treatment, clinically has improved, was transferred to the floor and has remained stable.  Fever/chills has resolved.  She has no abdominal pain, no nausea or vomiting and able to tolerate a regular diet.  She is ambulating in the room without  difficulties.  Given resolution of her symptoms, she will be converted to oral antibiotics and discharged home in stable condition for 8 additional days to complete a total of a 10-day course   Active problems Chronic systolic CHF, nonischemic cardiomyopathy-seeing cardiology as an outpatient.  Now that her blood pressure is normalized and septic shock has resolved, resume home medications upon discharge Leukopenia-RVP was checked and it was negative Ambulatory dysfunction-likely due to #1, did really well with PT and currently ambulatory DM2-resume home medications Obesity, morbid-BMI 55, she would benefit from weight loss Irregular menses, complex ovarian cysts-follow-up with OB/GYN as an outpatient  Outpatient Medications Prior to Visit  Medication Sig Dispense Refill   carvedilol (COREG) 25 MG tablet Take 1 tablet (25 mg total) by mouth 2 (two) times daily with a meal. 180 tablet 3   cephALEXin (KEFLEX) 500 MG capsule Take 1 capsule (500 mg total) by mouth 3 (three) times daily for 8 days. 24 capsule 0   furosemide (LASIX) 80 MG tablet Take 1 tablet (80 mg total) by mouth daily. 90 tablet 3   potassium chloride SA (KLOR-CON M20) 20 MEQ tablet Take 2 tablets (40 mEq total) by mouth daily. 180 tablet 3   sacubitril-valsartan (ENTRESTO) 97-103 MG Take 1 tablet by mouth 2 (two) times daily. 180 tablet 3   simvastatin (ZOCOR) 20 MG tablet Take 1 tablet (20 mg  total) by mouth daily. 90 tablet 1   XIGDUO XR 11-998 MG TB24 TAKE 1 TABLET BY MOUTH EVERY DAY 90 tablet 1   No facility-administered medications prior to visit.    ROS Review of Systems  Constitutional:  Positive for unexpected weight change (wt loss). Negative for chills, diaphoresis, fatigue and fever.  Eyes: Negative.   Respiratory:  Negative for cough, chest tightness, shortness of breath and wheezing.   Cardiovascular:  Negative for chest pain, palpitations and leg swelling.  Gastrointestinal: Negative.  Negative for abdominal  pain, diarrhea, nausea and vomiting.  Endocrine: Negative.   Genitourinary:  Negative for decreased urine volume, difficulty urinating, dysuria, flank pain, frequency, genital sores, hematuria, pelvic pain, vaginal bleeding, vaginal discharge and vaginal pain.       ++ vaginal itching  Musculoskeletal:  Positive for arthralgias and back pain. Negative for myalgias and neck pain.  Skin: Negative.  Negative for color change.  Neurological: Negative.  Negative for dizziness and weakness.  Hematological:  Negative for adenopathy. Does not bruise/bleed easily.  Psychiatric/Behavioral: Negative.      Objective:  BP 120/76 (BP Location: Left Arm, Patient Position: Sitting, Cuff Size: Large)   Pulse 85   Temp 97.6 F (36.4 C) (Oral)   Ht '5\' 4"'$  (1.626 m)   Wt 300 lb (136.1 kg)   SpO2 97%   BMI 51.49 kg/m   BP Readings from Last 3 Encounters:  03/06/22 120/76  02/27/22 127/79  02/14/22 109/75    Wt Readings from Last 3 Encounters:  03/06/22 300 lb (136.1 kg)  02/27/22 (!) 324 lb 4.8 oz (147.1 kg)  02/14/22 (!) 306 lb 9.6 oz (139.1 kg)    Physical Exam Vitals reviewed.  Constitutional:      Appearance: Normal appearance. She is obese.  HENT:     Mouth/Throat:     Mouth: Mucous membranes are moist.  Eyes:     General: No scleral icterus.    Conjunctiva/sclera: Conjunctivae normal.  Cardiovascular:     Rate and Rhythm: Normal rate and regular rhythm.     Heart sounds: No murmur heard. Pulmonary:     Effort: Pulmonary effort is normal.     Breath sounds: No stridor. No wheezing, rhonchi or rales.  Abdominal:     General: Abdomen is protuberant. Bowel sounds are normal. There is no distension.     Palpations: Abdomen is soft. There is no hepatomegaly, splenomegaly or mass.     Tenderness: There is no abdominal tenderness.  Musculoskeletal:        General: Normal range of motion.     Cervical back: Neck supple.     Right lower leg: No edema.     Left lower leg: No edema.   Lymphadenopathy:     Cervical: No cervical adenopathy.  Skin:    General: Skin is warm and dry.  Neurological:     General: No focal deficit present.     Mental Status: She is alert.  Psychiatric:        Mood and Affect: Mood normal.        Behavior: Behavior normal.     Lab Results  Component Value Date   WBC 4.6 02/27/2022   HGB 12.2 02/27/2022   HCT 38.1 02/27/2022   PLT 109 (L) 02/27/2022   GLUCOSE 138 (H) 02/27/2022   CHOL 176 03/01/2021   TRIG 110.0 03/01/2021   HDL 67.60 03/01/2021   LDLDIRECT 143.8 04/03/2011   LDLCALC 86 03/01/2021   ALT 44 02/27/2022  AST 48 (H) 02/27/2022   NA 139 02/27/2022   K 3.7 02/27/2022   CL 108 02/27/2022   CREATININE 0.81 02/27/2022   BUN 14 02/27/2022   CO2 24 02/27/2022   TSH 4.88 08/09/2021   INR 1.2 02/25/2022   HGBA1C 7.0 (H) 02/07/2022   MICROALBUR 1.6 03/01/2021    CT RENAL STONE STUDY  Result Date: 02/25/2022 CLINICAL DATA:  Abdominal pain, flank pain, shortness of breath EXAM: CT ABDOMEN AND PELVIS WITHOUT CONTRAST TECHNIQUE: Multidetector CT imaging of the abdomen and pelvis was performed following the standard protocol without IV contrast. RADIATION DOSE REDUCTION: This exam was performed according to the departmental dose-optimization program which includes automated exposure control, adjustment of the mA and/or kV according to patient size and/or use of iterative reconstruction technique. COMPARISON:  07/28/2017 FINDINGS: Lower chest: No acute abnormality. Hepatobiliary: No focal liver abnormality is seen. Hepatomegaly, maximum coronal span 21.8 cm. Hepatic steatosis. Status post cholecystectomy. No biliary dilatation. Pancreas: Unremarkable. No pancreatic ductal dilatation or surrounding inflammatory changes. Spleen: Normal in size without significant abnormality. Adrenals/Urinary Tract: Adrenal glands are unremarkable. Kidneys are normal, without renal calculi, solid lesion, or hydronephrosis. Bladder is unremarkable.  Stomach/Bowel: Stomach is within normal limits. Appendix appears normal. No evidence of bowel wall thickening, distention, or inflammatory changes. Vascular/Lymphatic: Aortic atherosclerosis. No enlarged abdominal or pelvic lymph nodes. Reproductive: No mass. Somewhat irregular and complex appearing bilateral fluid attenuation ovarian lesions, measuring up 2.7 x 1.6 cm on left (series 3, image 69) and 2.4 x 2.1 cm on the right (series 3, image 73). Other: No abdominal wall hernia or abnormality. No ascites. Musculoskeletal: No acute or significant osseous findings. IMPRESSION: 1. No acute noncontrast CT findings of the abdomen or pelvis to explain abdominal pain. No urinary tract calculi or hydronephrosis. 2. Hepatomegaly and hepatic steatosis. 3. Somewhat irregular and complex appearing bilateral fluid attenuation ovarian lesions, measuring up to 2.7 x 1.6 cm on left and 2.4 x 2.1 cm on the right. These are most likely benign ovarian cysts, however given appearance of complexity and in the postmenopausal setting, recommend dedicated pelvic ultrasound to further evaluate on a nonemergent, outpatient basis. Aortic Atherosclerosis (ICD10-I70.0). Electronically Signed   By: Delanna Ahmadi M.D.   On: 02/25/2022 14:21   [place] EKG SITE RITE  Result Date: 02/25/2022 If Site Rite image not attached, placement could not be confirmed due to current cardiac rhythm.  DG Chest 2 View  Result Date: 02/25/2022 CLINICAL DATA:  Fevers and short of breath. EXAM: CHEST - 2 VIEW COMPARISON:  06/21/2020 FINDINGS: Cardiomediastinal contours are unremarkable. There is no pleural effusion or edema. No airspace opacities identified. Scoliosis deformity noted involving the thoracic spine. IMPRESSION: No active cardiopulmonary abnormalities. Electronically Signed   By: Kerby Moors M.D.   On: 02/25/2022 08:25    Assessment & Plan:   [name] was seen today for urinary tract infection, osteoarthritis, back pain, diabetes and congestive  heart failure.  Diagnoses and all orders for this visit:  Acute cystitis without hematuria- The infection has resolved. -     Urinalysis, Routine w reflex microscopic; Future -     CULTURE, URINE COMPREHENSIVE; Future -     CULTURE, URINE COMPREHENSIVE -     Urinalysis, Routine w reflex microscopic  Vaginal yeast infection -     fluconazole (DIFLUCAN) 150 MG tablet; Take 1 tablet (150 mg total) by mouth once for 1 dose.  Type 2 diabetes mellitus with hyperglycemia, without long-term current use of insulin (HCC) -  Microalbumin / creatinine urine ratio; Future -     HM Diabetes Foot Exam -     Microalbumin / creatinine urine ratio  Primary osteoarthritis of both knees- I recommended that she avoid NSAIDs.  Will treat with tramadol. -     traMADol (ULTRAM) 50 MG tablet; Take 1 tablet (50 mg total) by mouth every 6 (six) hours as needed for up to 5 days. -     Ambulatory referral to Sports Medicine  Bilateral ovarian cysts -     US Pelvic Complete With Transvaginal; Future   I am having [name] start on fluconazole and traMADol. I am also having her maintain her potassium chloride SA, furosemide, sacubitril-valsartan, carvedilol, Xigduo XR, simvastatin, and cephALEXin.  Meds ordered this encounter  Medications   fluconazole (DIFLUCAN) 150 MG tablet    Sig: Take 1 tablet (150 mg total) by mouth once for 1 dose.    Dispense:  1 tablet    Refill:  3   traMADol (ULTRAM) 50 MG tablet    Sig: Take 1 tablet (50 mg total) by mouth every 6 (six) hours as needed for up to 5 days.    Dispense:  100 tablet    Refill:  3     Follow-up: No follow-ups on file.  [name], MD

## 2022-03-07 ENCOUNTER — Telehealth (HOSPITAL_COMMUNITY): Payer: Self-pay | Admitting: Psychiatry

## 2022-03-07 NOTE — Telephone Encounter (Signed)
D:  Placed second call to discuss MH-IOP with pt; but according to pt's husband she was asleep.  A:  Will attempt to reach pt again later.  Inform Dr. Parke Poisson and Tammy Sours, LPN.

## 2022-03-08 LAB — CULTURE, URINE COMPREHENSIVE

## 2022-03-14 ENCOUNTER — Encounter: Payer: Medicare Other | Admitting: Sports Medicine

## 2022-03-14 NOTE — Progress Notes (Deleted)
[name] D.[name] Phone: [phone]   Assessment and Plan:     There are no diagnoses linked to this encounter.  ***   Pertinent previous records reviewed include ***   Follow Up: ***     Subjective:   I, [name], am serving as a Education administrator for Doctor Glennon Mac  Chief Complaint: right knee and low back pain   HPI:   03/14/22 Patient is a 57 year old female complaining of right knee and low back pain. Patient states  Relevant Historical Information: ***  Additional pertinent review of systems negative.   Current Outpatient Medications:    carvedilol (COREG) 25 MG tablet, Take 1 tablet (25 mg total) by mouth 2 (two) times daily with a meal., Disp: 180 tablet, Rfl: 3   furosemide (LASIX) 80 MG tablet, Take 1 tablet (80 mg total) by mouth daily., Disp: 90 tablet, Rfl: 3   potassium chloride SA (KLOR-CON M20) 20 MEQ tablet, Take 2 tablets (40 mEq total) by mouth daily., Disp: 180 tablet, Rfl: 3   sacubitril-valsartan (ENTRESTO) 97-103 MG, Take 1 tablet by mouth 2 (two) times daily., Disp: 180 tablet, Rfl: 3   simvastatin (ZOCOR) 20 MG tablet, Take 1 tablet (20 mg total) by mouth daily., Disp: 90 tablet, Rfl: 1   XIGDUO XR 11-998 MG TB24, TAKE 1 TABLET BY MOUTH EVERY DAY, Disp: 90 tablet, Rfl: 1   Objective:     There were no vitals filed for this visit.    There is no height or weight on file to calculate BMI.    Physical Exam:    ***   Electronically signed by:  [name] D.[name] 7:43 AM 03/14/22

## 2022-03-15 NOTE — Progress Notes (Signed)
This encounter was created in error - please disregard.

## 2022-03-19 ENCOUNTER — Ambulatory Visit: Payer: Self-pay

## 2022-03-19 ENCOUNTER — Ambulatory Visit (INDEPENDENT_AMBULATORY_CARE_PROVIDER_SITE_OTHER): Payer: Medicare Other | Admitting: Family Medicine

## 2022-03-19 ENCOUNTER — Ambulatory Visit (INDEPENDENT_AMBULATORY_CARE_PROVIDER_SITE_OTHER): Payer: Medicare Other

## 2022-03-19 ENCOUNTER — Ambulatory Visit: Payer: Self-pay | Admitting: Licensed Clinical Social Worker

## 2022-03-19 VITALS — BP 138/88 | HR 90 | Ht 64.0 in | Wt 305.0 lb

## 2022-03-19 DIAGNOSIS — M545 Low back pain, unspecified: Secondary | ICD-10-CM

## 2022-03-19 DIAGNOSIS — M25561 Pain in right knee: Secondary | ICD-10-CM

## 2022-03-19 DIAGNOSIS — G8929 Other chronic pain: Secondary | ICD-10-CM

## 2022-03-19 DIAGNOSIS — M5416 Radiculopathy, lumbar region: Secondary | ICD-10-CM

## 2022-03-19 DIAGNOSIS — R202 Paresthesia of skin: Secondary | ICD-10-CM | POA: Diagnosis not present

## 2022-03-19 DIAGNOSIS — M1711 Unilateral primary osteoarthritis, right knee: Secondary | ICD-10-CM | POA: Diagnosis not present

## 2022-03-19 MED ORDER — PREGABALIN 75 MG PO CAPS: 75.0000 mg | ORAL_CAPSULE | Freq: Every evening | ORAL | 3 refills | Status: DC | PRN

## 2022-03-19 NOTE — Patient Instructions (Addendum)
Thank you for coming in today.   You received an injection today. Seek immediate medical attention if the joint becomes red, extremely painful, or is oozing fluid.   I've referred you to Aquatic Physical Therapy.  Let us know if you don't hear from them in one week.   Take lyrica at bedtime.  If that does not help with the nerve pain/cold sensation in your feet you could double it up. Let me know if you do that.   Please get an Xray today before you leave   Check back in 6 weeks

## 2022-03-19 NOTE — Progress Notes (Unsigned)
I, [name], LAT, ATC acting as a scribe for Samantha Leader, MD.  Subjective:    CC: R knee and low back pain  HPI: Pt is a 57 y/o female c/o R knee pain x 1-2 years. Pt locates pain to the anterior aspect of the R knee.   R Knee swelling: no Mechanical symptoms: yes Radiates: no Aggravates: laying supine, walking, transitioning to stand Treatments tried: BioFreeze, [employer], prior "shots"  Pt also c/o LBP ongoing for years. Pt had a lumbar ESI on 02/20/21 and 04/18/21. Pt notes that the prior some sort of back injection were not helpful. Pt locates pain to midline-to the R side of her back.  Radiating pain: yes LE numbness/tingling: yes LE weakness: no Aggravates: walking, standing Treatments tried: History of what sounds like facet injections or medial branch block and ablations in the past lumbar spine.  Additionally she notes bothersome cold sensation in her feet bilaterally.  She describes a tingling cold sensation bilateral feet.  She mentions that cardiology has already evaluated this and does not think that it is a circulatory problem. (No ABI has been completed visible in [facility] chart)  Dx imaging: 07/31/20 L-spine MRI  03/08/20 L-spine XR  Pertinent review of Systems: No fevers or chills  Relevant historical information: Diabetes.  Heart failure.  Hypertension.   Objective:    Vitals:   03/19/22 1425  BP: 138/88  Pulse: 90  SpO2: 97%   General: Well Developed, well nourished, and in no acute distress.   MSK: L-spine: Normal appearing Nontender palpation spinal midline. Decreased lumbar motion. Lower extremity strength is intact.  Right knee: Mild effusion. Normal motion with crepitation. Tender palpation medial joint line. Stable ligamentous exam.  Lab and Radiology Results  X-ray images right knee obtained today personally and independently interpreted Mild medial compartment DJD.  Mild patellofemoral DJD.  No acute fractures are  visible. Await formal radiology review  CT scan images of lumbar spine visible on CT scan abdomen or pelvis obtained February 25, 2022 personally independently interpreted. Diffuse degenerative changes present throughout lumbar spine.  Procedure: Real-time Ultrasound Guided Injection of right knee superior lateral patellar space Device: Philips Affiniti 50G Images permanently stored and available for review in PACS Verbal informed consent obtained.  Discussed risks and benefits of procedure. Warned about infection, bleeding, hyperglycemia damage to structures among others. Patient expresses understanding and agreement Time-out conducted.   Noted no overlying erythema, induration, or other signs of local infection.   Skin prepped in a sterile fashion.   Local anesthesia: Topical Ethyl chloride.   With sterile technique and under real time ultrasound guidance: 40 mg of Kenalog and 2 mL of Marcaine injected into knee joint. Fluid seen entering the joint capsule.   Completed without difficulty   Pain immediately resolved suggesting accurate placement of the medication.   Advised to call if fevers/chills, erythema, induration, drainage, or persistent bleeding.   Images permanently stored and available for review in the ultrasound unit.  Impression: Technically successful ultrasound guided injection.      Impression and Recommendations:    Assessment and Plan: 57 y.o. female with chronic low back pain and chronic right knee pain.  Patient is experiencing exacerbation of both of these issues following her recent hospitalization.  Additionally she has bothersome paresthesias sensation bilateral lower extremities..  Right knee pain due to exacerbation of DJD.  Plan for steroid injection and physical therapy as noted below.  Chronic back pain thought to be due to core  weakness and facet arthritis changes seen on CT scan.  She has had trials of other back injections in the past.  I can see 2  epidural steroid injections in her chart and she describes what sounds like medial branch blocks and ablations that have been not very effective.  I think a good starting point is aquatic physical therapy.  If not better would proceed with facet injections.  Additionally she has bothersome paresthesias bilateral extremities.  This sounds a lot like peripheral neuropathy.  These are worse at bedtime.  Will try Lyrica at bedtime.  She is tried gabapentin in the past and did not like it.  Recheck in 6 weeks.    PDMP reviewed during this encounter. Orders Placed This Encounter  Procedures   [place] LIMITED JOINT SPACE STRUCTURES LOW RIGHT(NO LINKED CHARGES)    Order Specific Question:   Reason for Exam (SYMPTOM  OR DIAGNOSIS REQUIRED)    Answer:   right knee pain    Order Specific Question:   Preferred imaging location?    Answer:   [place]   DG Knee AP/LAT W/[place] Right    Standing Status:   Future    Number of Occurrences:   1    Standing Expiration Date:   03/20/2023    Order Specific Question:   Reason for Exam (SYMPTOM  OR DIAGNOSIS REQUIRED)    Answer:   eval knee pain    Order Specific Question:   Is patient pregnant?    Answer:   No    Order Specific Question:   Preferred imaging location?    Answer:   [facility]   Ambulatory referral to Physical Therapy    Referral Priority:   Routine    Referral Type:   Physical Medicine    Referral Reason:   Specialty Services Required    Requested Specialty:   Physical Therapy    Number of Visits Requested:   1   Meds ordered this encounter  Medications   pregabalin (LYRICA) 75 MG capsule    Sig: Take 1 capsule (75 mg total) by mouth at bedtime as needed.    Dispense:  30 capsule    Refill:  3    Discussed warning signs or symptoms. Please see discharge instructions. Patient expresses understanding.   The above documentation has been reviewed and is accurate and complete [name], M.D.

## 2022-03-19 NOTE — Patient Outreach (Signed)
  Care Coordination  Follow Up Visit Note   03/19/2022 Name: [name] MRN: [id number] DOB: [birth year]  [name] is a 57 y.o. year old female who sees Samantha Lima, MD for primary care. I spoke with  [name] by phone today.  What matters to the patients health and wellness today?  Unable to keep appointment today as she is going for x-ray.  Would like to reschedule.     SDOH assessments and interventions completed:  No    Care Coordination Interventions:  No, not indicated   Follow up plan:  Will follow up in 5 to 7 days to reschedule call.    Encounter Outcome:  Pt. Visit Completed   [name], [place] [phone]

## 2022-03-19 NOTE — Patient Instructions (Signed)
    It was a pleasure speaking with you today. I am sorry you were unable to keep your phone appointment today.   I will follow up to reschedule your appointment  [name], [place] [phone]

## 2022-03-21 NOTE — Progress Notes (Signed)
Knee xray shows mild arthritis.

## 2022-03-26 ENCOUNTER — Ambulatory Visit
Admission: RE | Admit: 2022-03-26 | Discharge: 2022-03-26 | Disposition: A | Discharge status: Home / Self Care | Payer: Medicare Other | Source: Ambulatory Visit | Attending: Internal Medicine | Admitting: Internal Medicine

## 2022-03-26 DIAGNOSIS — D251 Intramural leiomyoma of uterus: Secondary | ICD-10-CM | POA: Diagnosis not present

## 2022-03-26 DIAGNOSIS — D252 Subserosal leiomyoma of uterus: Secondary | ICD-10-CM | POA: Diagnosis not present

## 2022-03-26 DIAGNOSIS — Z8742 Personal history of other diseases of the female genital tract: Secondary | ICD-10-CM | POA: Diagnosis not present

## 2022-03-26 DIAGNOSIS — N83201 Unspecified ovarian cyst, right side: Secondary | ICD-10-CM

## 2022-03-27 ENCOUNTER — Telehealth: Payer: Self-pay

## 2022-03-27 NOTE — Telephone Encounter (Signed)
Lyrica denied, has to be used for neuropathic pain associated with spinal cord injury, management of Fibromyalgia, management of postherpetic neuralgia, management of neuropathic pain associated with diabetic peripheral neuropathy, cancer related neuropathic pain, or cancer treatment-related neuropathic pain.   I did not see in patients chart that is was for one of these specifically. If it is can a letter be written or office notes be updated with that info included and I can fax that updated info.

## 2022-03-28 ENCOUNTER — Telehealth: Payer: Self-pay | Admitting: Internal Medicine

## 2022-03-28 NOTE — Telephone Encounter (Signed)
Lyrica using [facility] will cost like $17. Please bypass your insurance and get it using good [facility].  [name]

## 2022-03-28 NOTE — Telephone Encounter (Signed)
Pt has stated she is experiencing burning, itching and some bleeding since having her intravaginal [place]. Pt states the apptmnt they gave her to be seen by a GYN is 04/10/2022. She is wanting to see if Dr. Ronnald Ramp can get her seen sooner then the 04/10/2022 apptmnt. She wants her PCP opinion on this matter and states her vagina is going to fall out and she almost died from infection.  She wants [facility] nurse to cal her back with his instructions ASAP.

## 2022-03-29 ENCOUNTER — Encounter (HOSPITAL_COMMUNITY): Payer: Self-pay

## 2022-03-29 ENCOUNTER — Ambulatory Visit: Payer: Medicare Other | Admitting: Nurse Practitioner

## 2022-03-29 ENCOUNTER — Other Ambulatory Visit: Payer: Self-pay

## 2022-03-29 ENCOUNTER — Ambulatory Visit
Admission: EM | Admit: 2022-03-29 | Discharge: 2022-03-29 | Disposition: A | Discharge status: Home / Self Care | Payer: Medicare Other

## 2022-03-29 ENCOUNTER — Inpatient Hospital Stay (HOSPITAL_COMMUNITY)
Admission: EM | Admit: 2022-03-29 | Discharge: 2022-03-29 | Disposition: A | Discharge status: Home / Self Care | Payer: Medicare Other | Attending: Obstetrics & Gynecology | Admitting: Obstetrics & Gynecology

## 2022-03-29 DIAGNOSIS — R102 Pelvic and perineal pain: Secondary | ICD-10-CM | POA: Insufficient documentation

## 2022-03-29 DIAGNOSIS — L292 Pruritus vulvae: Secondary | ICD-10-CM | POA: Diagnosis not present

## 2022-03-29 DIAGNOSIS — N950 Postmenopausal bleeding: Secondary | ICD-10-CM | POA: Diagnosis not present

## 2022-03-29 DIAGNOSIS — N898 Other specified noninflammatory disorders of vagina: Secondary | ICD-10-CM | POA: Diagnosis not present

## 2022-03-29 LAB — WET PREP, GENITAL
Clue Cells Wet Prep HPF POC: NONE SEEN
Sperm: NONE SEEN
Trich, Wet Prep: NONE SEEN
WBC, Wet Prep HPF POC: >=10 — AB (ref <10)
Yeast Wet Prep HPF POC: NONE SEEN

## 2022-03-29 LAB — URINALYSIS, ROUTINE W REFLEX MICROSCOPIC
Bacteria, UA: NONE SEEN
Bilirubin Urine: NEGATIVE
Glucose, UA: >=500 mg/dL — AB
Ketones, ur: NEGATIVE mg/dL
Leukocytes,Ua: NEGATIVE
Nitrite: NEGATIVE
Protein, ur: NEGATIVE mg/dL
RBC / HPF: >50 RBC/hpf (ref 0–5)
Specific Gravity, Urine: 1.032 — ABNORMAL HIGH (ref 1.005–1.030)
pH: 5 (ref 5.0–8.0)

## 2022-03-29 MED ORDER — MEGESTROL ACETATE 40 MG PO TABS: 40.0000 mg | ORAL_TABLET | Freq: Two times a day (BID) | ORAL | 0 refills | Status: DC

## 2022-03-29 MED ORDER — FLUCONAZOLE 150 MG PO TABS: 150.0000 mg | ORAL_TABLET | Freq: Once | ORAL | 1 refills | Status: AC

## 2022-03-29 NOTE — MAU Provider Note (Signed)
History     CSN: [id number]  Arrival date and time: 03/29/22 6195   Event Date/Time   First Provider Initiated Contact with Patient 03/29/22 2050      Chief Complaint  Patient presents with  . Vaginal Pain   HPI  [name] is a 57 y.o. female who is a G2P1011, but is not currently pregnant.  She presents today for burning, itching, and blood.  She reports occasional pressure.  She states she went for a [place] on 03/26/2022 and "she killed me." She states after the procedure she "felt a little bit up" and clarifies that she was sore. She states towards the evening she started to experience more itching, burning, and light pink blood on the tissue.  She concluded that it was from the "intrusive" procedure.  She states the following day it progressed and has since worsened.  She states it is now bright red and going through her underwear.    Pertinent Gynecological History: Menses: post-menopausal Bleeding: post menopausal bleeding Contraception: none DES exposure: unknown Blood transfusions: none Sexually transmitted diseases: no past history Previous GYN Procedures:  Unsure, Thinks she has a hysterectomy   Last mammogram:  Not Assessed   Last pap:  Unsure  Date: Last Unknown  Past Medical History:  Diagnosis Date  . ADD (attention deficit disorder)   . Anxiety   . Arthritis    both knees right worse than left  . Carpal tunnel syndrome of right wrist   . Depression   . Essential hypertension, benign   . Gallstones   . History of smoking 06/22/2016  . Hyperlipidemia associated with type 2 diabetes mellitus ([place]), on Zocor 04/04/2011  . Hypertension associated with diabetes ([place]) 06/03/2008  . Migraines   . Morbid obesity ([place])   . Morbid obesity with BMI of 50.0-59.9, adult ([place]) 07/12/2006  . NICM (nonischemic cardiomyopathy) ([place]) 07/12/2006   01/16/18 ECHO:    - Procedure narrative: Transthoracic echocardiography. Image   quality was suboptimal. The study was technically  difficult.   Intravenous contrast (Definity) was administered. - Left ventricle: The cavity size was moderately dilated. Wall   thickness was increased in a pattern of mild LVH. Systolic   function was moderately to severely reduced. The estimated   ejection fraction w  . Normal coronary arteries 04/17/2016  . OSA on CPAP 10/28/2014  . Spinal stenosis    back pain  . Spondylosis without myelopathy or radiculopathy, lumbar region 12/04/2017  . Type 2 diabetes mellitus with hyperglycemia [facility])     Past Surgical History:  Procedure Laterality Date  . CHOLECYSTECTOMY    . DILATATION & CURETTAGE/HYSTEROSCOPY WITH MYOSURE N/A 10/31/2017   Procedure: DILATATION & CURETTAGE/HYSTEROSCOPY WITH MYOSURE;  Surgeon: Salvadore Dom, MD;  Location: [facility];  Service: Gynecology;  Laterality: N/A;  . RIGHT/LEFT HEART CATH AND CORONARY ANGIOGRAPHY N/A 04/11/2016   Procedure: Right/Left Heart Cath and Coronary Angiography;  Surgeon: Burnell Blanks, MD;  Location: [facility];  Service: Cardiovascular;  Laterality: N/A;  . sonogram for blood clots     no blockages    Family History  Problem Relation Age of Onset  . Depression Mother   . Anxiety disorder Mother   . Diabetes Mother   . Hypertension Mother   . ADD / ADHD Father   . Diabetes Father   . Hypertension Father   . Colitis Father   . Hypertension Other   . Diabetes Other   . Colitis Other   .  Alcohol abuse Other     Social History   Tobacco Use  . Smoking status: Former    Packs/day: 0.50    Types: Cigarettes    Quit date: 02/25/1997    Years since quitting: 25.1  . Smokeless tobacco: Never  . Tobacco comments:    Married, lives with spouse (when he is not traveling) and son  Vaping Use  . Vaping Use: Never used  Substance Use Topics  . Alcohol use: No  . Drug use: No    Allergies:  Allergies  Allergen Reactions  . Fluoxetine Other (See Comments)    More depressed  . Cymbalta [Duloxetine Hcl] Other (See  Comments)    depressed  . Penicillins     REACTION: rash Has patient had a PCN reaction causing immediate rash, facial/tongue/throat swelling, SOB or lightheadedness with hypotension: YES Has patient had a PCN reaction causing severe rash involving mucus membranes or skin necrosis: NO Has patient had a PCN reaction that required hospitalization: YES Has patient had a PCN reaction occurring within the last 10 years: NO If all of the above answers are "NO", then may proceed with Cephalosporin use.     Medications Prior to Admission  Medication Sig Dispense Refill Last Dose  . carvedilol (COREG) 25 MG tablet Take 1 tablet (25 mg total) by mouth 2 (two) times daily with a meal. 180 tablet 3   . furosemide (LASIX) 80 MG tablet Take 1 tablet (80 mg total) by mouth daily. 90 tablet 3   . potassium chloride SA (KLOR-CON M20) 20 MEQ tablet Take 2 tablets (40 mEq total) by mouth daily. 180 tablet 3   . pregabalin (LYRICA) 75 MG capsule Take 1 capsule (75 mg total) by mouth at bedtime as needed. 30 capsule 3   . sacubitril-valsartan (ENTRESTO) 97-103 MG Take 1 tablet by mouth 2 (two) times daily. 180 tablet 3   . simvastatin (ZOCOR) 20 MG tablet Take 1 tablet (20 mg total) by mouth daily. 90 tablet 1   . XIGDUO XR 11-998 MG TB24 TAKE 1 TABLET BY MOUTH EVERY DAY 90 tablet 1     Review of Systems  Constitutional:  Negative for chills and fever.  Gastrointestinal:  Negative for nausea and vomiting.  Genitourinary:  Positive for vaginal bleeding and vaginal pain (Feels Raw). Negative for difficulty urinating, dysuria and vaginal discharge.  Neurological:  Negative for dizziness, light-headedness and headaches.   Physical Exam   Blood pressure 138/69, pulse 76, temperature 98.6 F (37 C), temperature source Oral, resp. rate 18, height '5\' 4"'$  (1.626 m), weight (!) 136.6 kg, SpO2 97 %.  Physical Exam Vitals reviewed. Exam conducted with a chaperone present.  Constitutional:      Appearance: Normal  appearance.  HENT:     Head: Normocephalic and atraumatic.  Eyes:     Conjunctiva/sclera: Conjunctivae normal.  Cardiovascular:     Rate and Rhythm: Normal rate.  Pulmonary:     Effort: Pulmonary effort is normal. No respiratory distress.  Abdominal:     Palpations: Abdomen is soft.     Tenderness: There is no abdominal tenderness.  Genitourinary:    General: Normal vulva.     Cervix: Cervical bleeding present. No cervical motion tenderness or discharge.  Musculoskeletal:        General: Normal range of motion.     Cervical back: Normal range of motion.  Skin:    General: Skin is warm and dry.  Neurological:     Mental Status: She  is alert and oriented to person, place, and time.  Psychiatric:        Mood and Affect: Mood normal.        Behavior: Behavior normal.    MAU Course  Procedures Results for orders placed or performed during the hospital encounter of 03/29/22 (from the past 24 hour(s))  Urinalysis, Routine w reflex microscopic -Urine, Clean Catch     Status: Abnormal   Collection Time: 03/29/22  8:34 PM  Result Value Ref Range   Color, Urine YELLOW YELLOW   APPearance HAZY (A) CLEAR   Specific Gravity, Urine 1.032 (H) 1.005 - 1.030   pH 5.0 5.0 - 8.0   Glucose, UA >=500 (A) NEGATIVE mg/dL   Hgb urine dipstick LARGE (A) NEGATIVE   Bilirubin Urine NEGATIVE NEGATIVE   Ketones, ur NEGATIVE NEGATIVE mg/dL   Protein, ur NEGATIVE NEGATIVE mg/dL   Nitrite NEGATIVE NEGATIVE   Leukocytes,Ua NEGATIVE NEGATIVE   RBC / HPF >50 0 - 5 RBC/hpf   WBC, UA 6-10 0 - 5 WBC/hpf   Bacteria, UA NONE SEEN NONE SEEN   Squamous Epithelial / HPF 6-10 0 - 5 /HPF   Mucus PRESENT   Wet prep, genital     Status: Abnormal   Collection Time: 03/29/22  8:34 PM  Result Value Ref Range   Yeast Wet Prep HPF POC NONE SEEN NONE SEEN   Trich, Wet Prep NONE SEEN NONE SEEN   Clue Cells Wet Prep HPF POC NONE SEEN NONE SEEN   WBC, Wet Prep HPF POC >=10 (A) <10   Sperm NONE SEEN      MDM ***  Assessment and Plan  ***  Maryann Conners 03/29/2022, 8:50 PM   Reassessment (10:12 PM)

## 2022-03-29 NOTE — Telephone Encounter (Signed)
Pt has called and stated she has noticed she is bleeding more and more since the 30 th of January. Pt has stated she is noticing she is now passing some clots but mainly its bright blood. Pt states the burning and itching has increased as well. Pt states she almost died March 20, 2022 due to developing sepsis from her UTI.  Pt is refusing to go to ED and is wanting a provider to advise her on what to do and send a rx in for her. There are no apptmts available att his time with any provider.

## 2022-03-29 NOTE — ED Triage Notes (Signed)
Pt presents with vaginal itching, burning, bleeding that started after an intravaginal [place] on 1/30.

## 2022-03-29 NOTE — Telephone Encounter (Signed)
The other alternative is gabapentin.  You already tried that and did not like it.  I am prescribing Lyrica for indication that is not on the insurance list.  I am not can to be able to get this medication approved through your insurance.  I can prescribe the gabapentin if you would like.  I am not can to be able to get your insurance to pay for this medication.  Please let me know if you would like me to do.

## 2022-03-29 NOTE — MAU Note (Addendum)
Pt says she was in ER on 02-15-2022- chills and VB Then on 02-25-2022- shivering and VB- with quarter size clots. DX with UTI- was admitted to ICU overnight, then stayed 2 days  and D/C.  Had appointment for an U/S on 03-26-2022. Took all meds. No GYN Dr.  Had Vag U/S - after had itching, burning- in vag  then saw a spot of pink when she wiped. Wed- Thurs symptoms worse  On Thursday am - called Primary - Dr. Alona Bene -did not call her back Today- she called office today -so she called around to GYN's - made an appointment with 04-18-2022.  She already had an appointment on 04-10-2022- but didn't know it .   Now- her vag itches , burning and had VB-red on bedpad- no pad on, no underwear on. Pain- none  Last sex- 6-7 mths

## 2022-03-29 NOTE — ED Notes (Signed)
After reviewing chart, pt was advised by PCP of need to go to ED for c/o-after consulting with [name], [place] I encouraged pt to comply with recommendation and that UC does not perform pelvic exams or rapid blood testing-adult female with pt-pt states she will go to ED-pt NAD-steady gait as leaving UC

## 2022-03-29 NOTE — ED Provider Triage Note (Signed)
Emergency Medicine Provider OB Triage Evaluation Note  [name] is a 57 y.o. female, [id number], at Unknown gestation who presents to the emergency department with complaints of vaginal irritation with bleeding and burning after pelvic ultrasound, recently treated for UTI.  Review of  Systems  Positive: Vaginal irritation Negative: Vomiting, abdominal pain  Physical Exam  BP (!) 171/88 (BP Location: Right Arm)   Pulse 92   Temp 98.1 F (36.7 C) (Oral)   Resp (!) 27   Ht '5\' 4"'$  (1.626 m)   Wt (!) 138.3 kg   SpO2 97%   BMI 52.35 kg/m  General: Awake, no distress  HEENT: Atraumatic  Resp: Normal effort  Cardiac: Normal rate Abd: Nondistended, nontender  MSK: Moves all extremities without difficulty Neuro: Speech clear  Medical Decision Making  Pt evaluated for pregnancy concern and is stable for transfer to MAU. Pt is in agreement with plan for transfer.  7:44 PM Discussed with MAU APP, ERIN, who accepts patient in transfer.  Clinical Impression  No diagnosis found.     Gwenevere Abbot, [place] 03/29/22 1945

## 2022-04-01 LAB — GC/CHLAMYDIA PROBE AMP (~~LOC~~) NOT AT ARMC
Chlamydia: NEGATIVE
Comment: NEGATIVE
Comment: NORMAL
Neisseria Gonorrhea: NEGATIVE

## 2022-04-01 NOTE — Telephone Encounter (Signed)
Called pt and left VM to call the office.  

## 2022-04-03 ENCOUNTER — Other Ambulatory Visit (HOSPITAL_COMMUNITY): Payer: Medicare Other

## 2022-04-05 NOTE — Telephone Encounter (Signed)
Called and spoke with patient and advised of below per Dr. Georgina Snell.   Pt says that she cannot afford Lyrica and does not want to try Gabapentin again because it did not work. She says that her feet are ice cold, like frost bite, she is on disability and cannot even afford toilet paper.   Pt wants to know if Dr. Georgina Snell will have PCP Dr. Ronnald Ramp try sending in the script and getting her insurance to pay for it, if it would be covered then. I advised that unless they have access to information that we do not, the outcome/determination would likely be the same. Pt notes that in the past, meds from her cardiologist have been denied and they were able to call in and get it approved anyway.   Pt would like to know if there is any other alternative or if Dr.Corey will reach out to Dr. Ronnald Ramp about prescribing and getting Lyrica approved.   Forwarding to Dr. Georgina Snell to advise.

## 2022-04-08 ENCOUNTER — Ambulatory Visit: Payer: Self-pay | Admitting: Licensed Clinical Social Worker

## 2022-04-08 NOTE — Patient Instructions (Signed)
Visit Information  Thank you for taking time to visit with me today. Please don't hesitate to contact me if I can be of assistance to you.   Following are the goals we discussed today:   Goals Addressed             This Visit's Progress    Care Coordination Activities for mental health support       Activities in order to accomplish goals.   Keep medical appointment 04/10/22 Continue with compliance of taking medication prescribed by Doctor  Will assist with connecting to dentist based on need and insurance Starr Lake, DMD [address] [place], [place] [address] [phone]  Dr. Carmelia Roller [address], [place] [address]  [phone]             Our next appointment is by telephone on 04/11/22 at 3:00  Please call the care guide team at [phone] if you need to cancel or reschedule your appointment.   If you are experiencing a Mental Health or [place] or need someone to talk to, please call the Suicide and Crisis Lifeline: 988 call the [place] National Suicide Prevention Lifeline: 303-440-3633 or TTY: 450-383-7281 TTY 234-855-1081) to talk to a trained counselor call 1-800-273-TALK (toll free, 24 hour hotline) go to [facility] [address] [place] [phone])   Patient verbalizes understanding of instructions and care plan provided today and agrees to view in [place]. Active MyChart status and patient understanding of how to access instructions and care plan via MyChart confirmed with patient.     [name], [place] [phone]

## 2022-04-08 NOTE — Patient Outreach (Signed)
  Care Coordination  Follow Up Visit Note   04/08/2022 Name: [name] MRN: [id number] DOB: 1965/12/23  [name] is a 57 y.o. year old female who sees [name], MD for primary care. I spoke with  [name] by phone today.  What matters to the patients health and wellness today?   Patient is experiencing difficulty with getting answers related to her health, has not received results from Radiology appointment 03/26/22. Reports no one will return her call LCSW collaborated with OBGYN to assist with meeting patient's needs related to appointment.   Patient has requested LCSW send message to PCP ref. Update on results from Radiology appointment 03/26/22 ( message sent)    Goals Addressed             This Visit's Progress    Care Coordination Activities for mental health support       Activities in order to accomplish goals.   Keep medical appointment 04/10/22 Continue with compliance of taking medication prescribed by Doctor  Will assist with connecting to dentist based on need and insurance Starr Lake, DMD [address] [place], [place] [address] [phone]  Dr. Carmelia Roller [address], [place] [address]  [phone]             SDOH assessments and interventions completed:  No    Care Coordination Interventions:  Yes, provided  Interventions Today    Flowsheet Row Most Recent Value  Chronic Disease   Chronic disease during today's visit Hypertension (HTN), Diabetes, Congestive Heart Failure (CHF), Other  [Depression]  General Interventions   General Interventions Discussed/Reviewed General Interventions Discussed, Communication with  [barriers with navigating care and appointment]  Communication with PCP/Specialists       Follow up plan: Follow up call scheduled for 04/11/22    Encounter Outcome:  Pt. Visit Completed   [name], [place] [phone]

## 2022-04-08 NOTE — Telephone Encounter (Signed)
I am prescribing lyrica for a condition that it was never FDA approved for therefore your insurance will not be able to be talked into it.  If you have fibromyalgia or neuralgia after shingles yes I could convince them.  This medicine is generic now so your issue almost never happens.  Other options are even less good.

## 2022-04-09 ENCOUNTER — Ambulatory Visit (INDEPENDENT_AMBULATORY_CARE_PROVIDER_SITE_OTHER): Payer: Medicare Other | Admitting: Obstetrics and Gynecology

## 2022-04-09 ENCOUNTER — Encounter: Payer: Self-pay | Admitting: Obstetrics and Gynecology

## 2022-04-09 ENCOUNTER — Other Ambulatory Visit (HOSPITAL_COMMUNITY)
Admission: RE | Admit: 2022-04-09 | Discharge: 2022-04-09 | Disposition: A | Discharge status: Home / Self Care | Payer: Medicare Other | Source: Ambulatory Visit | Attending: Obstetrics and Gynecology | Admitting: Obstetrics and Gynecology

## 2022-04-09 VITALS — BP 138/97 | HR 95 | Wt 298.0 lb

## 2022-04-09 DIAGNOSIS — L8990 Pressure ulcer of unspecified site, unspecified stage: Secondary | ICD-10-CM

## 2022-04-09 DIAGNOSIS — N760 Acute vaginitis: Secondary | ICD-10-CM | POA: Diagnosis not present

## 2022-04-09 DIAGNOSIS — N83201 Unspecified ovarian cyst, right side: Secondary | ICD-10-CM

## 2022-04-09 DIAGNOSIS — N950 Postmenopausal bleeding: Secondary | ICD-10-CM | POA: Diagnosis not present

## 2022-04-09 MED ORDER — FLUCONAZOLE 150 MG PO TABS: 150.0000 mg | ORAL_TABLET | ORAL | 3 refills | Status: DC

## 2022-04-09 NOTE — Telephone Encounter (Signed)
Called pt at [phone] (on DPR) and left VM advising of below per Dr. Georgina Snell. Requested a call back with any other questions/concerns. Reminded pt about GoodRx card.

## 2022-04-09 NOTE — Progress Notes (Signed)
Pt presents for ED f/u. Pt is upset in office today and reports she is "bleeding like a pig and passing large clots." Pt also states "I feel like I'm dying. This is not normal." Pt expressed that she feels we do not care and that other providers have overlooked her issue.

## 2022-04-09 NOTE — Progress Notes (Signed)
GYNECOLOGY OFFICE VISIT NOTE  History:   [name] is a 57 y.o. G2P1011 here today for PMB and follow up from her [place].  She had the [place] due to incidental findings by her CT.   She had an [place] on 03/26/22 and showed EL 9 mm and right complicated cyst, possible hemorrhagic cyst vs endometrioma. I looked back at her Korea (saline infused) from 2019 and she was diagnosed with a polyp at that time. She had a D&C which was benign and showed polyps.   I reviewed the images myself and as reported by the radiologist - she has multiple intramural fibroids that are small, her EL was measured 8.6 mm. No specific flow on the [place] in the EL to suggest definitive polyp. Right cyst while complicated does appear most consistent with endometrioma although unusual to have this in post-menopausal setting. The images of this ovary are of poor quality and it could also be artifact.   Her bleeding started following the ultrasound. She reports the [place] itself was uncomfortable.   She went to the [facility] and was diagnosed clinically with yeast infection (she had itching/burning) and was treated accordingly with Diflucan x1 dose. It helped some initially but the symptoms continue to come and go. Her final culture was negative. GC/CT testing was negative from that visit as well. They prescribed her Megace. She took this for a couple days but then stopped it because she felt like she should know why she was bleeding before she took a mediation. It did help initially when she took it.   She does have a history of ovarian cysts prior to menopause per her chart but she has no recollection of this.   She had a normal pap and HPV negative in 07/2021.    Past Medical History:  Diagnosis Date   ADD (attention deficit disorder)    Anxiety    Arthritis    both knees right worse than left   Carpal tunnel syndrome of right wrist    Depression    Essential hypertension, benign    Gallstones    History of smoking 06/22/2016    Hyperlipidemia associated with type 2 diabetes mellitus ([place]), on Zocor 04/04/2011   Hypertension associated with diabetes ([place]) 06/03/2008   Migraines    Morbid obesity ([place])    Morbid obesity with BMI of 50.0-59.9, adult ([place]) 07/12/2006   NICM (nonischemic cardiomyopathy) ([place]) 07/12/2006   01/16/18 ECHO:    - Procedure narrative: Transthoracic echocardiography. Image   quality was suboptimal. The study was technically difficult.   Intravenous contrast (Definity) was administered. - Left ventricle: The cavity size was moderately dilated. Wall   thickness was increased in a pattern of mild LVH. Systolic   function was moderately to severely reduced. The estimated   ejection fraction w   Normal coronary arteries 04/17/2016   OSA on CPAP 10/28/2014   Spinal stenosis    back pain   Spondylosis without myelopathy or radiculopathy, lumbar region 12/04/2017   Type 2 diabetes mellitus with hyperglycemia [facility])     Past Surgical History:  Procedure Laterality Date   CHOLECYSTECTOMY     DILATATION & CURETTAGE/HYSTEROSCOPY WITH MYOSURE N/A 10/31/2017   Procedure: DILATATION & CURETTAGE/HYSTEROSCOPY WITH MYOSURE;  Surgeon: Salvadore Dom, MD;  Location: [facility];  Service: Gynecology;  Laterality: N/A;   RIGHT/LEFT HEART CATH AND CORONARY ANGIOGRAPHY N/A 04/11/2016   Procedure: Right/Left Heart Cath and Coronary Angiography;  Surgeon: Burnell Blanks, MD;  Location: [facility];  Service: Cardiovascular;  Laterality: N/A;   sonogram for blood clots     no blockages    The following portions of the patient's history were reviewed and updated as appropriate: allergies, current medications, past family history, past medical history, past social history, past surgical history and problem list.   Health Maintenance:   Normal pap and negative HRHPV:   Diagnosis  Date Value Ref Range Status  08/01/2021   Final   - Negative for intraepithelial lesion or malignancy (NILM)     Normal mammogram  on 2019. She is due for next MXR and it is ordered.    Review of Systems:  Pertinent items noted in HPI and remainder of comprehensive ROS otherwise negative.  Physical Exam:  BP (!) 138/97   Pulse 95   Wt 298 lb (135.2 kg)   BMI 51.15 kg/m  CONSTITUTIONAL: Well-developed, well-nourished female in no acute distress.  HEENT:  Normocephalic, atraumatic. External right and left ear normal. No scleral icterus.  NECK: Normal range of motion, supple, no masses noted on observation SKIN: No rash noted. Not diaphoretic. No erythema. No pallor. MUSCULOSKELETAL: Normal range of motion. No edema noted. NEUROLOGIC: Alert and oriented to person, place, and time. Normal muscle tone coordination. No cranial nerve deficit noted. PSYCHIATRIC: Normal mood and affect. Normal behavior. Normal judgment and thought content.  PELVIC: Normal appearing external genitalia; normal urethral meatus; normal appearing vaginal mucosa and cervix.  Moderate amount of vaginal bleeding noted from the os. Prior to exam, significant amount of bleeding on pad and a clot noted outside.  Normal uterine size, no other palpable masses, no uterine or adnexal tenderness but exam limited by body habitus. Performed in the presence of a chaperone  Labs and Imaging No results found for this or any previous visit (from the past 168 hour(s)). US Pelvic Complete With Transvaginal  Result Date: 03/26/2022 CLINICAL DATA:  Abnormal CT, BILATERAL ovarian lesions EXAM: TRANSABDOMINAL AND TRANSVAGINAL ULTRASOUND OF PELVIS TECHNIQUE: Both transabdominal and transvaginal ultrasound examinations of the pelvis were performed. Transabdominal technique was performed for global imaging of the pelvis including uterus, ovaries, adnexal regions, and pelvic cul-de-sac. It was necessary to proceed with endovaginal exam following the transabdominal exam to visualize the uterus, endometrium, and ovaries. COMPARISON:  CT abdomen and pelvis 02/25/2022, pelvic  ultrasound 10/07/2017 FINDINGS: Uterus Measurements: 10.5 x 6.5 x 5.3 cm = volume: 186 mL. Anteverted. Heterogeneous myometrium. Subserosal leiomyoma posterior uterus 3.7 cm diameter. Additional small intramural leiomyoma posterior mid uterus 2.0 cm diameter. Anterior wall subserosal leiomyoma upper uterus 2.3 cm diameter. Endometrium Thickness: 9 mm.  No endometrial fluid or mass Right ovary Measurements: 4.5 x 2.8 x 3.6 cm = volume: 23.6 mL. Hypoechoic nodule RIGHT ovary 2.5 x 2.3 x 3.4 cm consistent with complicated cyst, question endometrioma versus hemorrhagic cyst. Left ovary Measurements: Questionably visualized, 2.9 x 2.2 x 2.4 cm = volume: 8.1 mL. Suboptimally seen on transabdominal imaging, not visualized on transvaginal imaging. Other findings No free pelvic fluid or additional adnexal masses. IMPRESSION: Multiple uterine leiomyomata. Complicated cyst RIGHT ovary 3.4 cm diameter question endometrioma versus hemorrhagic cyst; follow-up ultrasound recommended in 6-12 weeks to reassess. Questionable visualization of LEFT ovary; no LEFT adnexal mass identified. Electronically Signed   By: Lavonia Dana M.D.   On: 03/26/2022 16:08   DG Knee AP/LAT W/Sunrise Right  Result Date: 03/21/2022 CLINICAL DATA:  Knee pain. EXAM: RIGHT KNEE 3 VIEWS COMPARISON:  None Available. FINDINGS: Tricompartmental degenerative changes. There appears to be  mild joint space loss in the medial compartment. No fracture, dislocation, or joint effusion. No other significant bony or soft tissue abnormalities are identified. IMPRESSION: Tricompartmental degenerative changes. There appears to be mild joint space loss in the medial compartment. Electronically Signed   By: Dorise Bullion III M.D.   On: 03/21/2022 11:33    Assessment and Plan:  Diagnoses and all orders for this visit:  Postmenopausal bleeding - We discussed differential diagnosis and the most important diagnoses being cancer, precancerous, or polyps. I think she is  having too much bleeding for atrophy especially when not on a blood thinner or other cause of bleeding.  - Discussed options for EMB but review this would not rule out polyp or provide the option for treatment, however it would tell us if cancer or precancerous. She would prefer D&C, hysteroscopy.  - We discussed if it will be sometime before her surgery then I would have her do the Megace.  - She would prefer surgery with a female but after discussing she would take first available if much sooner with a female provider. She was scheduled on 2/20 with Dr. Elgie Congo based on this discussion which did also involve her husband per her request.   Right ovarian cyst - Discussed this could be seen during surgery if we added in a laparoscopy but given it's size and appearance, I think this would not be worth the increased risk associated with surgery. I would advise following the cyst on a repeat US once the bleeding is addressed.   Acute vaginitis - May still be yeast since she noticed improvement with Diflucan x1 dose. Nothing apparent on exam. Discussed it may also be irritation from the blood but difficult to say from exam alone. Cultures done and will send fluconazole.  -     fluconazole (DIFLUCAN) 150 MG tablet; Take 1 tablet (150 mg total) by mouth every 3 (three) days. For three doses -     Cervicovaginal ancillary only( [place])  Pressure injury of skin, unspecified injury stage, unspecified location - I did not see the pressure ulcer but we discussed I would be happy to call in a foam ring for her.    Routine preventative health maintenance measures emphasized. Please refer to After Visit Summary for other counseling recommendations.   Return in about 1 month (around 05/08/2022) for Follow up - may have surgery before then.  Radene Gunning, MD, [place] for [facility], [place]

## 2022-04-10 ENCOUNTER — Ambulatory Visit: Payer: Medicare Other | Admitting: Obstetrics and Gynecology

## 2022-04-10 NOTE — Progress Notes (Signed)
Reviewed pt chart since pt scheduled today @ [facility] for surgery on 04-16-2022.  Noted pt has ef 30% which below anesthesia guideline for ambulatory surgery center.  Sent inbox message to Dr L. Bass and OR scheduler , Jordan informed pt not candidate for surgery center per anesthesia guidelines pt would need to be done main OR.

## 2022-04-11 ENCOUNTER — Ambulatory Visit: Payer: Self-pay | Admitting: Licensed Clinical Social Worker

## 2022-04-11 DIAGNOSIS — N950 Postmenopausal bleeding: Secondary | ICD-10-CM

## 2022-04-11 LAB — CERVICOVAGINAL ANCILLARY ONLY
Bacterial Vaginitis (gardnerella): NEGATIVE
Candida Glabrata: NEGATIVE
Candida Vaginitis: NEGATIVE
Comment: NEGATIVE
Comment: NEGATIVE
Comment: NEGATIVE

## 2022-04-11 NOTE — Patient Outreach (Signed)
  Care Coordination  Follow Up Visit Note   04/11/2022 Name: [name] MRN: [id number] DOB: 28-Sep-1965  [name] is a 57 y.o. year old female who sees [name], MD for primary care. I spoke with  [name] by phone today.  What matters to the patients health and wellness today?    Patient continues to experience difficulty with navigating her mental and physical health needs. She continues to be frustrated with not being able to accomplish these task on her own She is making progress and has appointments scheduled to address health concerns  Reminded patient of all up coming appointments    Goals Addressed             This Visit's Progress    Care Coordination Activities for mental health physical health support       Activities in order to accomplish goals.   Relaxed breathing 3 times daily Continue with compliance of taking medication prescribed by Doctor               SDOH assessments and interventions completed:  Yes  SDOH Interventions Today    Flowsheet Row Most Recent Value  SDOH Interventions   Stress Interventions Provide Counseling        Care Coordination Interventions:  Yes, provided  Interventions Today    Flowsheet Row Most Recent Value  Chronic Disease   Chronic disease during today's visit Hypertension (HTN), Diabetes, Congestive Heart Failure (CHF), Other  [Depression]  General Interventions   General Interventions Discussed/Reviewed General Interventions Reviewed, Doctor Visits  Doctor Visits Discussed/Reviewed Specialist  PCP/Specialist Visits Compliance with follow-up visit  [reviewed up coming appointments]  Education Interventions   Education Provided Provided Education  Provided Verbal Education On Insurance Plans  [[facility] Discussed/Reviewed Mental Health Reviewed, Depression, Coping Strategies  [relaxed breathing, solution focused, emotional  support]       Follow up plan: Follow up call scheduled for 04/16/22  to continue to assist with connecting to mental health provider  Encounter Outcome:  Pt. Visit Completed   [name], [place] [phone]

## 2022-04-11 NOTE — Patient Instructions (Signed)
Visit Information  Thank you for taking time to visit with me today. Please don't hesitate to contact me if I can be of assistance to you.   Following are the goals we discussed today:   Goals Addressed             This Visit's Progress    Care Coordination Activities for mental health physical health support       Activities in order to accomplish goals.   Relaxed breathing 3 times daily Continue with compliance of taking medication prescribed by Doctor               Please call the care guide team at [phone] if you need to cancel or reschedule your appointment.   If you are experiencing a Mental Health or [place] or need someone to talk to, please call the Suicide and Crisis Lifeline: 988 call the [place] National Suicide Prevention Lifeline: 310-127-8169 or TTY: 860-165-0332 TTY 563-523-5097) to talk to a trained counselor call 1-800-273-TALK (toll free, 24 hour hotline) go to [facility] [address], [place] [phone])   Patient verbalizes understanding of instructions and care plan provided today and agrees to view in [place]. Active MyChart status and patient understanding of how to access instructions and care plan via MyChart confirmed with patient.     [name], [place] [phone]

## 2022-04-12 ENCOUNTER — Telehealth: Payer: Self-pay | Admitting: *Deleted

## 2022-04-12 NOTE — Progress Notes (Signed)
Advised of results. See telephone note for details of call.

## 2022-04-12 NOTE — Telephone Encounter (Signed)
TC to pt to notify of negative cultures and Dr. Josefine Class recommendations. I has a lengthy conversation with the patient. She had questions about upcoming surgery and preop appt with Dr. Currie Paris. I clarified the reason for the surgery date change/ pt is high risk and was rescheduled to main OR at [facility]. I clarified the reason, time, date and location of her preop appt with Dr. Currie Paris.The pt expressed concern that she continues to experience heavy VB and now that surgery is postponed to later date she will be bleeding for even longer. We discussed Dr. Josefine Class previous recommendations for pt to take either Megace or Lysteda to control the VB. Pt wants more information on each medication so she can decide which one to take. Pt would like TC from Dr. Damita Dunnings today to discuss. Message sent to Dr. Damita Dunnings vis secure chat.

## 2022-04-12 NOTE — Telephone Encounter (Signed)
Returned TC to pt with updated recommendations/ information from Dr. Damita Dunnings. Advised of risks and benefits of megace vs lysteda. Pt will take lysteda and use as directed by Dr. Damita Dunnings. Advised to notify the office if bleeding does not improve with lysteda.

## 2022-04-15 ENCOUNTER — Ambulatory Visit: Payer: Medicare Other | Admitting: Obstetrics and Gynecology

## 2022-04-15 MED ORDER — TRANEXAMIC ACID 650 MG PO TABS: 1300.0000 mg | ORAL_TABLET | Freq: Three times a day (TID) | ORAL | 2 refills | Status: DC

## 2022-04-16 ENCOUNTER — Ambulatory Visit: Payer: Self-pay | Admitting: Licensed Clinical Social Worker

## 2022-04-17 NOTE — Patient Instructions (Signed)
Visit Information  Thank you for taking time to visit with me today. Please don't hesitate to contact me if I can be of assistance to you.   Following are the goals we discussed today:   Goals Addressed             This Visit's Progress    Care Coordination Activities for mental health physical health support       Activities in order to accomplish goals.   Call [facility] [phone] ( [place] for medication management and to schedule your counseling appointment. Continue with compliance of taking medication prescribed by Doctor Call [facility] to see if they take your insurance   Review dental list that [facility] [place] is mailing you [facility]  [place], [place] [address] [phone]            Our next appointment is by telephone on 04/23/22 at 3:30  Please call the care guide team at [phone] if you need to cancel or reschedule your appointment.   If you are experiencing a Mental Health or [place] or need someone to talk to, please call the Suicide and Crisis Lifeline: 988 call the [place] National Suicide Prevention Lifeline: 971-603-8734 or TTY: 254-445-7736 TTY 575 270 7340) to talk to a trained counselor call 1-800-273-TALK (toll free, 24 hour hotline) go to [facility] [address], [place] [phone]) call 911   Patient verbalizes understanding of instructions and care plan provided today and agrees to view in [place]. Active MyChart status and patient understanding of how to access instructions and care plan via MyChart confirmed with patient.     [name], [place] [phone]

## 2022-04-17 NOTE — Patient Outreach (Signed)
  Care Coordination  Follow Up Visit Note   04/17/2022 Name: [name] MRN: [id number] DOB: Oct 17, 1965  [name] is a 57 y.o. year old female who sees [name], MD for primary care. I spoke with  [name] by phone today.  What matters to the patients health and wellness today?  Would like Dental and Mental health needs met  Patient continues to experience difficulty with navigating and managing her mental health. She is tearful at times today with frustration of not being able to navigate her needs. Discussed barriers to care and utilized solution focus and emotions support to assist with progressing towards her goal. She is making progress with her health concerns, had procedure scheduled in March. .    Goals Addressed             This Visit's Progress    Care Coordination Activities for mental health physical health support       Activities in order to accomplish goals.   Call [facility] [phone] ( [place] for medication management and to schedule your counseling appointment. Continue with compliance of taking medication prescribed by Doctor Call [facility] to see if they take your insurance   Review dental list that [facility] of [place] is mailing you [facility]  [place], [place] [address] [phone]            SDOH assessments and interventions completed:  No  Care Coordination Interventions:  Yes, provided  Interventions Today    Flowsheet Row Most Recent Value  Chronic Disease   Chronic disease during today's visit Diabetes, Hypertension (HTN), Congestive Heart Failure (CHF), Other  [Major Depression ,  ADHD]  General Interventions   General Interventions Discussed/Reviewed General Interventions Reviewed, [employer], Doctor Visits  [Connecting to a dentist]  Doctor Visits Discussed/Reviewed Specialist  Education Interventions   Education Provided Provided Education  Provided Verbal  Education On Mental Health/Coping with Illness  Mental Health Interventions   Mental Health Discussed/Reviewed Mental Health Reviewed, Depression, Anxiety  [Solution Focused , task centered, & motivational interviewing]  Refer to Social Work for resources regarding Mental Health  [discussed options and barariers with connecting to a mental health provider]       Follow up plan: Follow up call scheduled for 04/23/22    Encounter Outcome:  Pt. Visit Completed   [name], [place] [phone]

## 2022-04-21 ENCOUNTER — Other Ambulatory Visit: Payer: Self-pay

## 2022-04-22 ENCOUNTER — Telehealth: Payer: Self-pay

## 2022-04-22 NOTE — Telephone Encounter (Signed)
Patient is upset and vented for aprx. 45 mins. She is taking the Lysteda as directed, but it is not decreasing the vaginal bleeding. She is changing pads every 30 mins and refuses to report to the hospital. Per MD, patient can start the Megace or come in for the endometrial biopsy as discussed. Pt  refuses the EBx but agrees to take the Megace. Patient also reports "severe" cramping and back pain. Patient was not aware she had a prescription on file for Tramadol to treat knee and chronic back pain. Informed patient the Tramadol prescription should also ease the cramping. In addition, she requests to move the surgery date up and follow up on the "donut' rx. Informed patient I will consult with her doctor. Patient agrees and has no further questions.

## 2022-04-23 ENCOUNTER — Ambulatory Visit: Payer: Self-pay | Admitting: Licensed Clinical Social Worker

## 2022-04-23 MED ORDER — FOAM CUSHION THERAPEUTIC RING MISC: 0 refills | Status: DC

## 2022-04-23 NOTE — Addendum Note (Signed)
Addended by: Maryruth Eve on: 04/23/2022 05:20 PM   Modules accepted: Orders

## 2022-04-23 NOTE — Patient Instructions (Signed)
Visit Information  Thank you for taking time to visit with me today. Please don't hesitate to contact me if I can be of assistance to you.   Following are the goals we discussed today:   Goals Addressed             This Visit's Progress    Care Coordination Activities for mental health / physical health support       Activities in order to accomplish goals.   Continue with compliance of taking medication prescribed by Doctor  Keep dental appointment March 20th at 3:00 with  [phone]   Dr. Shary Decamp [address] [place] [address] Bring ID and insurance card              Our next appointment is by telephone on 04/29/22 at 3;30  Please call the care guide team at [phone] if you need to cancel or reschedule your appointment.   If you are experiencing a Mental Health or [place] or need someone to talk to, please call the Suicide and Crisis Lifeline: 988 call the [place] National Suicide Prevention Lifeline: 223-556-6751 or TTY: 862-492-5503 TTY (601) 292-4656) to talk to a trained counselor call 1-800-273-TALK (toll free, 24 hour hotline) go to [facility] [address] [place] [phone])   Patient verbalizes understanding of instructions and care plan provided today and agrees to view in [place]. Active MyChart status and patient understanding of how to access instructions and care plan via MyChart confirmed with patient.     [name], [place] [phone]

## 2022-04-23 NOTE — Patient Outreach (Signed)
  Care Coordination  Follow Up Visit Note   04/23/2022 Name: [name] MRN: [id number] DOB: 06/27/1965  [name] is a 57 y.o. year old female who sees Samantha Lima, MD for primary care. I spoke with  [name] by phone today.  What matters to the patients health and wellness today?  Getting a dentist, having pain when eating    Goals Addressed             This Visit's Progress    Care Coordination Activities for mental health / physical health support       Activities in order to accomplish goals.   Continue with compliance of taking medication prescribed by Doctor  Keep dental appointment March 20th at 3:00 with  [phone]   Dr. Shary Decamp [address] Bring ID and insurance card              SDOH assessments and interventions completed:  No   Care Coordination Interventions:  Yes, provided  Interventions Today    Flowsheet Row Most Recent Value  Chronic Disease   Chronic disease during today's visit Hypertension (HTN), Congestive Heart Failure (CHF), Diabetes  General Interventions   General Interventions Discussed/Reviewed General Interventions Reviewed, [employer], Communication with  Doctor Visits Discussed/Reviewed Doctor Visits Reviewed  [upcoming appointment]  Communication with --  [Assisted patient with getting dental appointment scheduled]  Mental Health Interventions   Mental Health Discussed/Reviewed Mental Health Reviewed, Coping Strategies, Depression       Follow up plan: Follow up call scheduled for 04/29/22    Encounter Outcome:  Pt. Visit Completed   [name], [place] [phone]

## 2022-04-25 ENCOUNTER — Telehealth: Payer: Self-pay | Admitting: *Deleted

## 2022-04-25 NOTE — Telephone Encounter (Signed)
TC from pt seeking clarification on taking megestrol and on upcoming pre op appts. Pt encouraged to follow most recent POC and take megestrol as lysteda did not control bleeding. Pt concerned that megestrol may affect D and C results. Reassured pt that risks and benefits of the Emily were weighed by Dr. Damita Dunnings and that the need for control of VB outweighed the concern for altered procedure results. Pt does report taking megestrol and improvement in bleeding. Advised pt that upcoming appt on 05/02/22 with Dr. Currie Paris was pre op appt with surgeon but that she would have a separate pre op appt for preadmission screening and testing. Pt concerned for current cold symptoms and fears surgery will be cancelled. Pt has not left her home in some time. Reassured that unless she is Covid +, surgery should not be cancelled. Reassured pt that I would f/u with [facility] pre op staff to confirm. Pt concerned that Dr. Currie Paris would be unaware of the decision for pt to take megestrol. Reassured pt of communication between referring provider and surgeon.

## 2022-04-29 ENCOUNTER — Ambulatory Visit: Payer: Self-pay | Admitting: Licensed Clinical Social Worker

## 2022-04-29 NOTE — Patient Outreach (Signed)
  Care Coordination  Follow Up Visit Note   04/29/2022 Name: [name] MRN: [id number] DOB: 29-May-1965  [name] is a 57 y.o. year old female who sees [name], MD for primary care. I spoke with  [name] by phone today.  What matters to the patients health and wellness today?    Patient is making progress with getting appointments scheduled to manage her physical and mental health needs .   Reports she is feeling hopeful today and trying not to get overwhelmed with all of her appointments in March   Goals Addressed             This [place] for mental health / physical health support       Activities in order to accomplish goals.   Continue with compliance of taking medication prescribed by Doctor  Keep dental appointment March 20th at 3:00 with  [phone]   Dr. Shary Decamp [address] [place] [address] Keep mental health appointment March 19th at 4:15 with Staci Acosta at [place] assessments and interventions completed:  Yes  SDOH Interventions Today    Flowsheet Row Most Recent Value  SDOH Interventions   Stress Interventions Provide Counseling  [has therapy appointment scheduled]      Care Coordination Interventions:  Yes, provided  Interventions Today    Flowsheet Row Most Recent Value  Chronic Disease   Chronic disease during today's visit Diabetes, Hypertension (HTN), Congestive Heart Failure (CHF)  General Interventions   General Interventions Discussed/Reviewed General Interventions Reviewed  Education Interventions   Education Provided Provided Education  Mental Health Interventions   Mental Health Discussed/Reviewed Mental Health Reviewed, Coping Strategies, Depression, Anxiety  [has been able to schedule appointment with [facility]/ soultion focused and motivational interviewing]       Follow up plan: Follow up call scheduled for 05/27/22     Encounter Outcome:  Pt. Visit Completed   [name], [place] [phone]

## 2022-04-29 NOTE — Patient Instructions (Signed)
Visit Information  Thank you for taking time to visit with me today. Please don't hesitate to contact me if I can be of assistance to you.   Following are the goals we discussed today:   Goals Addressed             This Visit's Progress    Care Coordination Activities for mental health / physical health support       Activities in order to accomplish goals.   Continue with compliance of taking medication prescribed by Doctor  Keep dental appointment March 20th at 3:00 with  [phone]   Dr. Shary Decamp [place] [address] Keep mental health appointment March 19th at 4:15 with [place] [facility] [place] next appointment is by telephone on 05/27/22 at 3:00  Please call the care guide team at [phone] if you need to cancel or reschedule your appointment.   If you are experiencing a Mental Health or [place] or need someone to talk to, please call the Suicide and Crisis Lifeline: 988 call the [place] National Suicide Prevention Lifeline: 781-348-9612 or TTY: 8732200940 TTY 2268528055) to talk to a trained counselor call 1-800-273-TALK (toll free, 24 hour hotline) go to [facility] [address], [place] [phone])   Patient verbalizes understanding of instructions and care plan provided today and agrees to view in [place]. Active MyChart status and patient understanding of how to access instructions and care plan via MyChart confirmed with patient.     [name], [place] [phone]

## 2022-04-30 ENCOUNTER — Ambulatory Visit: Payer: Medicare Other | Admitting: Family Medicine

## 2022-05-01 NOTE — Progress Notes (Signed)
Surgical Instructions    Your procedure is scheduled on May 07, 2022.  Report to [facility] Main Entrance "A" at 9:30 A.M., then check in with the Admitting office.  Call this number if you have problems the morning of surgery:  [phone]   If you have any questions prior to your surgery date call [phone]: Open Monday-Friday 8am-4pm If you experience any cold or flu symptoms such as cough, fever, chills, shortness of breath, etc. between now and your scheduled surgery, please notify us at the above number     Remember:  Do not eat or drink after midnight the night before your surgery    Take these medicines the morning of surgery with A SIP OF WATER:  carvedilol (COREG)  sacubitril-valsartan (ENTRESTO)   If Needed:  fluticasone (FLONASE)  traMADol (ULTRAM)   Hold XIGDUO XR 72 hours (3 days) prior to surgery. Last dose on 05/03/2022  As of today, STOP taking any Aspirin (unless otherwise instructed by your surgeon) Aleve, Naproxen, Ibuprofen, Motrin, Advil, Goody's, BC's, all herbal medications, fish oil, and all vitamins.   WHAT DO I DO ABOUT MY DIABETES MEDICATION?   Do not take oral diabetes medicines (pills) the morning of surgery.  The day of surgery, do not take other diabetes injectables, including Byetta (exenatide), Bydureon (exenatide ER), Victoza (liraglutide), or Trulicity (dulaglutide).  If your CBG is greater than 220 mg/dL, you may take  of your sliding scale (correction) dose of insulin.   HOW TO MANAGE YOUR DIABETES BEFORE AND AFTER SURGERY  Why is it important to control my blood sugar before and after surgery? Improving blood sugar levels before and after surgery helps healing and can limit problems. A way of improving blood sugar control is eating a healthy diet by:  Eating less sugar and carbohydrates  Increasing activity/exercise  Talking with your doctor about reaching your blood sugar goals High blood sugars (greater than 180 mg/dL) can  raise your risk of infections and slow your recovery, so you will need to focus on controlling your diabetes during the weeks before surgery. Make sure that the doctor who takes care of your diabetes knows about your planned surgery including the date and location.  How do I manage my blood sugar before surgery? Check your blood sugar at least 4 times a day, starting 2 days before surgery, to make sure that the level is not too high or low.  Check your blood sugar the morning of your surgery when you wake up and every 2 hours until you get to the Short Stay unit.  If your blood sugar is less than 70 mg/dL, you will need to treat for low blood sugar: Do not take insulin. Treat a low blood sugar (less than 70 mg/dL) with  cup of clear juice (cranberry or apple), 4 glucose tablets, OR glucose gel. Recheck blood sugar in 15 minutes after treatment (to make sure it is greater than 70 mg/dL). If your blood sugar is not greater than 70 mg/dL on recheck, call [phone] for further instructions. Report your blood sugar to the short stay nurse when you get to Short Stay.  If you are admitted to the hospital after surgery: Your blood sugar will be checked by the staff and you will probably be given insulin after surgery (instead of oral diabetes medicines) to make sure you have good blood sugar levels. The goal for blood sugar control after surgery is 80-180 mg/dL.   Special instructions:    Oral Hygiene is  also important to reduce your risk of infection.  Remember - BRUSH YOUR TEETH THE MORNING OF SURGERY WITH YOUR REGULAR TOOTHPASTE   [facility] [place]- Preparing For Surgery  Before surgery, you can play an important role. Because skin is not sterile, your skin needs to be as free of germs as possible. You can reduce the number of germs on your skin by washing with CHG (chlorahexidine gluconate) Soap before surgery.  CHG is an antiseptic cleaner which kills germs and bonds with the skin to continue  killing germs even after washing.     Please do not use if you have an allergy to CHG or antibacterial soaps. If your skin becomes reddened/irritated stop using the CHG.  Do not shave (including legs and underarms) for at least 48 hours prior to first CHG shower. It is OK to shave your face.  Please follow these instructions carefully.     Shower the NIGHT BEFORE SURGERY and the MORNING OF SURGERY with CHG Soap.   If you chose to wash your hair, wash your hair first as usual with your normal shampoo. After you shampoo, rinse your hair and body thoroughly to remove the shampoo.  Then [employer] and genitals (private parts) with your normal soap and rinse thoroughly to remove soap.  After that Use CHG Soap as you would any other liquid soap. You can apply CHG directly to the skin and wash gently with a scrungie or a clean washcloth.   Apply the CHG Soap to your body ONLY FROM THE NECK DOWN.  Do not use on open wounds or open sores. Avoid contact with your eyes, ears, mouth and genitals (private parts). Wash Face and genitals (private parts)  with your normal soap.   Wash thoroughly, paying special attention to the area where your surgery will be performed.  Thoroughly rinse your body with warm water from the neck down.  DO NOT shower/wash with your normal soap after using and rinsing off the CHG Soap.  Pat yourself dry with a CLEAN TOWEL.  Wear CLEAN PAJAMAS to bed the night before surgery  Place CLEAN SHEETS on your bed the night before your surgery  DO NOT SLEEP WITH PETS.   Day of Surgery:  Take a shower with CHG soap. Wear Clean/Comfortable clothing the morning of surgery Do not apply any deodorants/lotions.   Remember to brush your teeth WITH YOUR REGULAR TOOTHPASTE.  Do not wear jewelry or makeup. Do not wear lotions, powders, perfumes/cologne or deodorant. Do not shave 48 hours prior to surgery.  Men may shave face and neck. Do not bring valuables to the hospital. Do not  wear nail polish, gel polish, artificial nails, or any other type of covering on natural nails (fingers and toes) If you have artificial nails or gel coating that need to be removed by a nail salon, please have this removed prior to surgery. Artificial nails or gel coating may interfere with anesthesia's ability to adequately monitor your vital signs.  [facility] is not responsible for any belongings or valuables.    Do NOT Smoke (Tobacco/Vaping)  24 hours prior to your procedure  If you use a CPAP at night, you may bring your mask for your overnight stay.   Contacts, glasses, hearing aids, dentures or partials may not be worn into surgery, please bring cases for these belongings   For patients admitted to the hospital, discharge time will be determined by your treatment team.   Patients discharged the day of surgery will  not be allowed to drive home, and someone needs to stay with them for 24 hours.   SURGICAL WAITING ROOM VISITATION Patients having surgery or a procedure may have no more than 2 support people in the waiting area - these visitors may rotate.   Children under the age of 12 must have an adult with them who is not the patient. If the patient needs to stay at the hospital during part of their recovery, the visitor guidelines for inpatient rooms apply. Pre-op nurse will coordinate an appropriate time for 1 support person to accompany patient in pre-op.  This support person may not rotate.   Please refer to [email or web address].hu for the visitor guidelines for Inpatients (after your surgery is over and you are in a regular room).    If you received a COVID test during your pre-op visit, it is requested that you wear a mask when out in public, stay away from anyone that may not be feeling well, and notify your surgeon if you develop symptoms. If you have been in contact with anyone that has tested positive in the last 10 days, please  notify your surgeon.    Please read over the following fact sheets that you were given.

## 2022-05-02 ENCOUNTER — Other Ambulatory Visit: Payer: Self-pay

## 2022-05-02 ENCOUNTER — Encounter (HOSPITAL_COMMUNITY): Payer: Self-pay

## 2022-05-02 ENCOUNTER — Encounter: Payer: Self-pay | Admitting: Obstetrics and Gynecology

## 2022-05-02 ENCOUNTER — Ambulatory Visit (INDEPENDENT_AMBULATORY_CARE_PROVIDER_SITE_OTHER): Payer: Medicare Other | Admitting: Obstetrics and Gynecology

## 2022-05-02 ENCOUNTER — Encounter (HOSPITAL_COMMUNITY)
Admission: RE | Admit: 2022-05-02 | Discharge: 2022-05-02 | Disposition: A | Discharge status: Home / Self Care | Payer: Medicare Other | Source: Ambulatory Visit | Attending: Obstetrics and Gynecology | Admitting: Obstetrics and Gynecology

## 2022-05-02 VITALS — BP 119/81 | HR 89 | Temp 98.0°F | Resp 19 | Ht 64.0 in | Wt 303.0 lb

## 2022-05-02 VITALS — BP 116/74 | HR 87 | Ht 64.0 in | Wt 304.0 lb

## 2022-05-02 DIAGNOSIS — I428 Other cardiomyopathies: Secondary | ICD-10-CM | POA: Diagnosis not present

## 2022-05-02 DIAGNOSIS — Z01812 Encounter for preprocedural laboratory examination: Secondary | ICD-10-CM | POA: Diagnosis not present

## 2022-05-02 DIAGNOSIS — I110 Hypertensive heart disease with heart failure: Secondary | ICD-10-CM | POA: Diagnosis not present

## 2022-05-02 DIAGNOSIS — N950 Postmenopausal bleeding: Secondary | ICD-10-CM | POA: Diagnosis not present

## 2022-05-02 DIAGNOSIS — E119 Type 2 diabetes mellitus without complications: Secondary | ICD-10-CM | POA: Insufficient documentation

## 2022-05-02 DIAGNOSIS — I5022 Chronic systolic (congestive) heart failure: Secondary | ICD-10-CM | POA: Insufficient documentation

## 2022-05-02 DIAGNOSIS — R0609 Other forms of dyspnea: Secondary | ICD-10-CM | POA: Diagnosis not present

## 2022-05-02 DIAGNOSIS — Z01818 Encounter for other preprocedural examination: Secondary | ICD-10-CM

## 2022-05-02 LAB — BASIC METABOLIC PANEL
Anion gap: 13 (ref 5–15)
BUN: 22 mg/dL — ABNORMAL HIGH (ref 6–20)
CO2: 19 mmol/L — ABNORMAL LOW (ref 22–32)
Calcium: 9.7 mg/dL (ref 8.9–10.3)
Chloride: 108 mmol/L (ref 98–111)
Creatinine, Ser: 0.9 mg/dL (ref 0.44–1.00)
GFR, Estimated: >60 mL/min (ref >60)
Glucose, Bld: 110 mg/dL — ABNORMAL HIGH (ref 70–99)
Potassium: 4.3 mmol/L (ref 3.5–5.1)
Sodium: 140 mmol/L (ref 135–145)

## 2022-05-02 LAB — CBC
HCT: 46.5 % — ABNORMAL HIGH (ref 36.0–46.0)
Hemoglobin: 14.9 g/dL (ref 12.0–15.0)
MCH: 28.9 pg (ref 26.0–34.0)
MCHC: 32 g/dL (ref 30.0–36.0)
MCV: 90.3 fL (ref 80.0–100.0)
Platelets: 239 10*3/uL (ref 150–400)
RBC: 5.15 MIL/uL — ABNORMAL HIGH (ref 3.87–5.11)
RDW: 15.1 % (ref 11.5–15.5)
WBC: 11 10*3/uL — ABNORMAL HIGH (ref 4.0–10.5)
nRBC: 0 % (ref 0.0–0.2)

## 2022-05-02 LAB — GLUCOSE, CAPILLARY: Glucose-Capillary: 136 mg/dL — ABNORMAL HIGH (ref 70–99)

## 2022-05-02 NOTE — Progress Notes (Signed)
GYNECOLOGY VISIT  Patient name: [name] MRN [id number]  Date of birth: 24-Mar-1965 Chief Complaint:   Pre-op Exam (D & C)   History:  [name] is a 57 y.o. G2P1011 being seen today for preop evaluation for scheduled hysteroscopy, D&C for PMB. Stopped taking it after 3-4 days because her bleeding stopped and not currently bleeding at this time. Continues to have cramping as well as low back pain and upper thigh pain simimlar to menstrual.   Per Dr. Damita Dunnings note: "She had the [place] due to incidental findings by her CT.    She had an [place] on 03/26/22 and showed EL 9 mm and right complicated cyst, possible hemorrhagic cyst vs endometrioma. I looked back at her [place] (saline infused) from 2019 and she was diagnosed with a polyp at that time. She had a D&C which was benign and showed polyps. ... Her bleeding started following the ultrasound. She reports the [place] itself was uncomfortable.    She went to the [facility] and was diagnosed clinically with yeast infection (she had itching/burning) and was treated accordingly with Diflucan x1 dose. It helped some initially but the symptoms continue to come and go. Her final culture was negative. GC/CT testing was negative from that visit as well. They prescribed her Megace. She took this for a couple days but then stopped it because she felt like she should know why she was bleeding before she took a mediation. It did help initially when she took it."  Past Medical History:  Diagnosis Date   ADD (attention deficit disorder)    Anxiety    Arthritis    both knees right worse than left   Carpal tunnel syndrome of right wrist    Depression    Essential hypertension, benign    Gallstones    History of smoking 06/22/2016   Hyperlipidemia associated with type 2 diabetes mellitus ([place]), on Zocor 04/04/2011   Hypertension associated with diabetes ([place]) 06/03/2008   Migraines    Morbid obesity ([place])    Morbid obesity with BMI of 50.0-59.9, adult ([place]) 07/12/2006   NICM  (nonischemic cardiomyopathy) ([place]) 07/12/2006   01/16/18 ECHO:    - Procedure narrative: Transthoracic echocardiography. Image   quality was suboptimal. The study was technically difficult.   Intravenous contrast (Definity) was administered. - Left ventricle: The cavity size was moderately dilated. Wall   thickness was increased in a pattern of mild LVH. Systolic   function was moderately to severely reduced. The estimated   ejection fraction w   Normal coronary arteries 04/17/2016   OSA on CPAP 10/28/2014   Spinal stenosis    back pain   Spondylosis without myelopathy or radiculopathy, lumbar region 12/04/2017   Type 2 diabetes mellitus with hyperglycemia [facility])     Past Surgical History:  Procedure Laterality Date   CHOLECYSTECTOMY     DILATATION & CURETTAGE/HYSTEROSCOPY WITH MYOSURE N/A 10/31/2017   Procedure: DILATATION & CURETTAGE/HYSTEROSCOPY WITH MYOSURE;  Surgeon: Salvadore Dom, MD;  Location: [facility];  Service: Gynecology;  Laterality: N/A;   RIGHT/LEFT HEART CATH AND CORONARY ANGIOGRAPHY N/A 04/11/2016   Procedure: Right/Left Heart Cath and Coronary Angiography;  Surgeon: Burnell Blanks, MD;  Location: [facility];  Service: Cardiovascular;  Laterality: N/A;   sonogram for blood clots     no blockages    The following portions of the patient's history were reviewed and updated as appropriate: allergies, current medications, past family history, past medical history, past social history, past  surgical history and problem list.   Health Maintenance:   Last pap     Component Value Date/Time   DIAGPAP  08/01/2021 1656    - Negative for intraepithelial lesion or malignancy (NILM)   DIAGPAP  09/09/2017 0000    NEGATIVE FOR INTRAEPITHELIAL LESIONS OR MALIGNANCY.   [place] Negative 08/01/2021 1656   ADEQPAP  08/01/2021 1656    Satisfactory for evaluation; transformation zone component ABSENT.   ADEQPAP  09/09/2017 0000    Satisfactory for evaluation   endocervical/transformation zone component PRESENT.     Review of Systems:  Pertinent items are noted in HPI. Comprehensive review of systems was otherwise negative.   Objective:  Physical Exam BP 116/74   Pulse 87   Ht '5\' 4"'$  (1.626 m)   Wt (!) 304 lb (137.9 kg)   BMI 52.18 kg/m    Physical Exam Vitals and nursing note reviewed.  Constitutional:      Appearance: Normal appearance.  HENT:     Head: Normocephalic and atraumatic.  Pulmonary:     Effort: Pulmonary effort is normal.  Skin:    General: Skin is warm and dry.  Neurological:     General: No focal deficit present.     Mental Status: She is alert.  Psychiatric:        Mood and Affect: Mood normal.        Behavior: Behavior normal.        Thought Content: Thought content normal.        Judgment: Judgment normal.       Assessment & Plan:   1. Postmenopausal bleeding Answered all questions regarding potential causes of current bleeding including polyp, endometrial hyperplasia and cancer. Discussed how procedure is carried out, risk of procedure and postoperative expectations.   Patient desires surgical management with hysteroscopy d&c.  The risks of surgery were discussed in detail with the patient including but not limited to: bleeding which may require transfusion or reoperation; infection which may require prolonged hospitalization or re-hospitalization and antibiotic therapy; injury to bowel, bladder, ureters and major vessels or other surrounding organs which may lead to other procedures; formation of adhesions; need for additional procedures including laparotomy or subsequent procedures secondary to intraoperative injury or abnormal pathology; thromboembolic phenomenon; and other postoperative or anesthesia complications.  Patient was told that the likelihood that her condition and symptoms will be treated effectively with this surgical management was very high; the postoperative expectations were also discussed in  detail. The patient also understands the alternative treatment options which were discussed in full. All questions were answered.  Routine preoperative instructions will be given to her by the preoperative nursing team.   I spent 15 minutes with this patient answering questions, reviewing her cart, discussion of procedure in regards to postmenopausal bleeding.    Darliss Cheney, MD [facility] [place]

## 2022-05-02 NOTE — H&P (View-Only) (Signed)
  GYNECOLOGY VISIT  Patient name: [name] MRN [id number]  Date of birth: 05/16/1965 Chief Complaint:   Pre-op Exam (D & C)   History:  [name] is a 57 y.o. G2P1011 being seen today for preop evaluation for scheduled hysteroscopy, D&C for PMB. Stopped taking it after 3-4 days because her bleeding stopped and not currently bleeding at this time. Continues to have cramping as well as low back pain and upper thigh pain simimlar to menstrual.   Per Dr. Duncan note: "She had the US due to incidental findings by her CT.    She had an US on 03/26/22 and showed EL 9 mm and right complicated cyst, possible hemorrhagic cyst vs endometrioma. I looked back at her US (saline infused) from 2019 and she was diagnosed with a polyp at that time. She had a D&C which was benign and showed polyps. ... Her bleeding started following the ultrasound. She reports the US itself was uncomfortable.    She went to the [facility] and was diagnosed clinically with yeast infection (she had itching/burning) and was treated accordingly with Diflucan x1 dose. It helped some initially but the symptoms continue to come and go. Her final culture was negative. GC/CT testing was negative from that visit as well. They prescribed her Megace. She took this for a couple days but then stopped it because she felt like she should know why she was bleeding before she took a mediation. It did help initially when she took it."  Past Medical History:  Diagnosis Date   ADD (attention deficit disorder)    Anxiety    Arthritis    both knees right worse than left   Carpal tunnel syndrome of right wrist    Depression    Essential hypertension, benign    Gallstones    History of smoking 06/22/2016   Hyperlipidemia associated with type 2 diabetes mellitus (HCC), on Zocor 04/04/2011   Hypertension associated with diabetes (HCC) 06/03/2008   Migraines    Morbid obesity (HCC)    Morbid obesity with BMI of 50.0-59.9, adult (HCC) 07/12/2006   NICM  (nonischemic cardiomyopathy) (HCC) 07/12/2006   01/16/18 ECHO:    - Procedure narrative: Transthoracic echocardiography. Image   quality was suboptimal. The study was technically difficult.   Intravenous contrast (Definity) was administered. - Left ventricle: The cavity size was moderately dilated. Wall   thickness was increased in a pattern of mild LVH. Systolic   function was moderately to severely reduced. The estimated   ejection fraction w   Normal coronary arteries 04/17/2016   OSA on CPAP 10/28/2014   Spinal stenosis    back pain   Spondylosis without myelopathy or radiculopathy, lumbar region 12/04/2017   Type 2 diabetes mellitus with hyperglycemia (HCC)     Past Surgical History:  Procedure Laterality Date   CHOLECYSTECTOMY     DILATATION & CURETTAGE/HYSTEROSCOPY WITH MYOSURE N/A 10/31/2017   Procedure: DILATATION & CURETTAGE/HYSTEROSCOPY WITH MYOSURE;  Surgeon: Jertson, Jill Evelyn, MD;  Location: [facility];  Service: Gynecology;  Laterality: N/A;   RIGHT/LEFT HEART CATH AND CORONARY ANGIOGRAPHY N/A 04/11/2016   Procedure: Right/Left Heart Cath and Coronary Angiography;  Surgeon: Christopher D McAlhany, MD;  Location: [facility];  Service: Cardiovascular;  Laterality: N/A;   sonogram for blood clots     no blockages    The following portions of the patient's history were reviewed and updated as appropriate: allergies, current medications, past family history, past medical history, past social history, past   surgical history and problem list.   Health Maintenance:   Last pap     Component Value Date/Time   DIAGPAP  08/01/2021 1656    - Negative for intraepithelial lesion or malignancy (NILM)   DIAGPAP  09/09/2017 0000    NEGATIVE FOR INTRAEPITHELIAL LESIONS OR MALIGNANCY.   HPVHIGH Negative 08/01/2021 1656   ADEQPAP  08/01/2021 1656    Satisfactory for evaluation; transformation zone component ABSENT.   ADEQPAP  09/09/2017 0000    Satisfactory for evaluation   endocervical/transformation zone component PRESENT.     Review of Systems:  Pertinent items are noted in HPI. Comprehensive review of systems was otherwise negative.   Objective:  Physical Exam BP 116/74   Pulse 87   Ht 5' 4" (1.626 m)   Wt (!) 304 lb (137.9 kg)   BMI 52.18 kg/m    Physical Exam Vitals and nursing note reviewed.  Constitutional:      Appearance: Normal appearance.  HENT:     Head: Normocephalic and atraumatic.  Pulmonary:     Effort: Pulmonary effort is normal.  Skin:    General: Skin is warm and dry.  Neurological:     General: No focal deficit present.     Mental Status: She is alert.  Psychiatric:        Mood and Affect: Mood normal.        Behavior: Behavior normal.        Thought Content: Thought content normal.        Judgment: Judgment normal.       Assessment & Plan:   1. Postmenopausal bleeding Answered all questions regarding potential causes of current bleeding including polyp, endometrial hyperplasia and cancer. Discussed how procedure is carried out, risk of procedure and postoperative expectations.   Patient desires surgical management with hysteroscopy d&c.  The risks of surgery were discussed in detail with the patient including but not limited to: bleeding which may require transfusion or reoperation; infection which may require prolonged hospitalization or re-hospitalization and antibiotic therapy; injury to bowel, bladder, ureters and major vessels or other surrounding organs which may lead to other procedures; formation of adhesions; need for additional procedures including laparotomy or subsequent procedures secondary to intraoperative injury or abnormal pathology; thromboembolic phenomenon; and other postoperative or anesthesia complications.  Patient was told that the likelihood that her condition and symptoms will be treated effectively with this surgical management was very high; the postoperative expectations were also discussed in  detail. The patient also understands the alternative treatment options which were discussed in full. All questions were answered.  Routine preoperative instructions will be given to her by the preoperative nursing team.   I spent 15 minutes with this patient answering questions, reviewing her cart, discussion of procedure in regards to postmenopausal bleeding.    Evanell Redlich, MD Minimally Invasive [facility], [facility] 

## 2022-05-02 NOTE — Progress Notes (Signed)
PCP - [name] MD  Cardiologist - Minus Breeding  PPM/ICD - denies Device Orders -  Rep Notified -   Chest x-ray - 02/25/22 EKG - 02/26/22 Stress Test -  ECHO - 09/25/17 Cardiac Cath - 04/11/16  Sleep Study - 04/22/14 CPAP - states she cannot tolerate  Fasting Blood Sugar - states she does not check her sugar and does not have a blood sugar meter.  Checks Blood Sugar _____ times a day  Last dose of GLP1 agonist-  na GLP1 instructions:   Blood Thinner Instructions:na Aspirin Instructions:na  ERAS Protcol -no PRE-SURGERY Ensure or G2-   COVID TEST- na   Anesthesia review: yes- patient has history of cardiomyopathy and has not received cardiac clearance for surgery. [name] Anesthesia PAs aware. Pt made aware that that PAs would be contacting surgeon and cardiology regarding upcoming surgery.   Patient denies shortness of breath, fever, cough and chest pain at PAT appointment   All instructions explained to the patient, with a verbal understanding of the material. Patient agrees to go over the instructions while at home for a better understanding. Patient also instructed to wear a mask when out in public prior to surgery. The opportunity to ask questions was provided.

## 2022-05-02 NOTE — Progress Notes (Signed)
Surgical Instructions    Your procedure is scheduled on May 07, 2022.  Report to [facility] Main Entrance "A" at 8:00 A.M., then check in with the Admitting office.  Call this number if you have problems the morning of surgery:  [phone]   If you have any questions prior to your surgery date call [phone]: Open Monday-Friday 8am-4pm If you experience any cold or flu symptoms such as cough, fever, chills, shortness of breath, etc. between now and your scheduled surgery, please notify us at the above number     Remember:  Do not eat or drink after midnight the night before your surgery    Take these medicines the morning of surgery with A SIP OF WATER:  carvedilol (COREG)  simvastatin (ZOCOR)  megestrol (MEGACE)   If Needed:  fluticasone (FLONASE)  traMADol (ULTRAM)   Hold XIGDUO XR 72 hours (3 days) prior to surgery. Last dose on 05/03/2022  As of today, STOP taking any Aspirin (unless otherwise instructed by your surgeon) Aleve, Naproxen, Ibuprofen, Motrin, Advil, Goody's, BC's, all herbal medications, fish oil, and all vitamins.   WHAT DO I DO ABOUT MY DIABETES MEDICATION?   Do not take oral diabetes medicines (pills) the morning of surgery.  The day of surgery, do not take other diabetes injectables, including Byetta (exenatide), Bydureon (exenatide ER), Victoza (liraglutide), or Trulicity (dulaglutide).  If your CBG is greater than 220 mg/dL, you may take  of your sliding scale (correction) dose of insulin.   HOW TO MANAGE YOUR DIABETES BEFORE AND AFTER SURGERY  Why is it important to control my blood sugar before and after surgery? Improving blood sugar levels before and after surgery helps healing and can limit problems. A way of improving blood sugar control is eating a healthy diet by:  Eating less sugar and carbohydrates  Increasing activity/exercise  Talking with your doctor about reaching your blood sugar goals High blood sugars (greater than 180  mg/dL) can raise your risk of infections and slow your recovery, so you will need to focus on controlling your diabetes during the weeks before surgery. Make sure that the doctor who takes care of your diabetes knows about your planned surgery including the date and location.  How do I manage my blood sugar before surgery? Check your blood sugar at least 4 times a day, starting 2 days before surgery, to make sure that the level is not too high or low.  Check your blood sugar the morning of your surgery when you wake up and every 2 hours until you get to the Short Stay unit.  If your blood sugar is less than 70 mg/dL, you will need to treat for low blood sugar: Do not take insulin. Treat a low blood sugar (less than 70 mg/dL) with  cup of clear juice (cranberry or apple), 4 glucose tablets, OR glucose gel. Recheck blood sugar in 15 minutes after treatment (to make sure it is greater than 70 mg/dL). If your blood sugar is not greater than 70 mg/dL on recheck, call [phone] for further instructions. Report your blood sugar to the short stay nurse when you get to Short Stay.  If you are admitted to the hospital after surgery: Your blood sugar will be checked by the staff and you will probably be given insulin after surgery (instead of oral diabetes medicines) to make sure you have good blood sugar levels. The goal for blood sugar control after surgery is 80-180 mg/dL.   Special instructions:  Oral Hygiene is also important to reduce your risk of infection.  Remember - BRUSH YOUR TEETH THE MORNING OF SURGERY WITH YOUR REGULAR TOOTHPASTE   [facility]- Preparing For Surgery  Before surgery, you can play an important role. Because skin is not sterile, your skin needs to be as free of germs as possible. You can reduce the number of germs on your skin by washing with CHG (chlorahexidine gluconate) Soap before surgery.  CHG is an antiseptic cleaner which kills germs and bonds with the skin to  continue killing germs even after washing.     Please do not use if you have an allergy to CHG or antibacterial soaps. If your skin becomes reddened/irritated stop using the CHG.  Do not shave (including legs and underarms) for at least 48 hours prior to first CHG shower. It is OK to shave your face.  Please follow these instructions carefully.     Shower the NIGHT BEFORE SURGERY and the MORNING OF SURGERY with CHG Soap.   If you chose to wash your hair, wash your hair first as usual with your normal shampoo. After you shampoo, rinse your hair and body thoroughly to remove the shampoo.  Then [employer] and genitals (private parts) with your normal soap and rinse thoroughly to remove soap.  After that Use CHG Soap as you would any other liquid soap. You can apply CHG directly to the skin and wash gently with a scrungie or a clean washcloth.   Apply the CHG Soap to your body ONLY FROM THE NECK DOWN.  Do not use on open wounds or open sores. Avoid contact with your eyes, ears, mouth and genitals (private parts). Wash Face and genitals (private parts)  with your normal soap.   Wash thoroughly, paying special attention to the area where your surgery will be performed.  Thoroughly rinse your body with warm water from the neck down.  DO NOT shower/wash with your normal soap after using and rinsing off the CHG Soap.  Pat yourself dry with a CLEAN TOWEL.  Wear CLEAN PAJAMAS to bed the night before surgery  Place CLEAN SHEETS on your bed the night before your surgery  DO NOT SLEEP WITH PETS.   Day of Surgery:  Take a shower with CHG soap. Wear Clean/Comfortable clothing the morning of surgery Do not apply any deodorants/lotions.   Remember to brush your teeth WITH YOUR REGULAR TOOTHPASTE.  Do not wear jewelry or makeup. Do not wear lotions, powders, perfumes/cologne or deodorant. Do not shave 48 hours prior to surgery.  Men may shave face and neck. Do not bring valuables to the  hospital. Do not wear nail polish, gel polish, artificial nails, or any other type of covering on natural nails (fingers and toes) If you have artificial nails or gel coating that need to be removed by a nail salon, please have this removed prior to surgery. Artificial nails or gel coating may interfere with anesthesia's ability to adequately monitor your vital signs.  [facility] is not responsible for any belongings or valuables.    Do NOT Smoke (Tobacco/Vaping)  24 hours prior to your procedure  If you use a CPAP at night, you may bring your mask for your overnight stay.   Contacts, glasses, hearing aids, dentures or partials may not be worn into surgery, please bring cases for these belongings   For patients admitted to the hospital, discharge time will be determined by your treatment team.   Patients discharged the day  of surgery will not be allowed to drive home, and someone needs to stay with them for 24 hours.   SURGICAL WAITING ROOM VISITATION Patients having surgery or a procedure may have no more than 2 support people in the waiting area - these visitors may rotate.   Children under the age of 44 must have an adult with them who is not the patient. If the patient needs to stay at the hospital during part of their recovery, the visitor guidelines for inpatient rooms apply. Pre-op nurse will coordinate an appropriate time for 1 support person to accompany patient in pre-op.  This support person may not rotate.   Please refer to [email or web address].hu for the visitor guidelines for Inpatients (after your surgery is over and you are in a regular room).    If you received a COVID test during your pre-op visit, it is requested that you wear a mask when out in public, stay away from anyone that may not be feeling well, and notify your surgeon if you develop symptoms. If you have been in contact with anyone that has tested positive in the  last 10 days, please notify your surgeon.    Please read over the following fact sheets that you were given.

## 2022-05-03 ENCOUNTER — Telehealth: Payer: Self-pay | Admitting: Internal Medicine

## 2022-05-03 ENCOUNTER — Encounter: Payer: Self-pay | Admitting: Obstetrics and Gynecology

## 2022-05-03 ENCOUNTER — Other Ambulatory Visit: Payer: Self-pay | Admitting: Obstetrics and Gynecology

## 2022-05-03 ENCOUNTER — Telehealth: Payer: Self-pay | Admitting: Cardiology

## 2022-05-03 DIAGNOSIS — Z1231 Encounter for screening mammogram for malignant neoplasm of breast: Secondary | ICD-10-CM

## 2022-05-03 LAB — HEMOGLOBIN A1C
Hgb A1c MFr Bld: 6.5 % — ABNORMAL HIGH (ref 4.8–5.6)
Mean Plasma Glucose: 140 mg/dL

## 2022-05-03 NOTE — Telephone Encounter (Signed)
Error

## 2022-05-03 NOTE — Telephone Encounter (Signed)
Patient is scheduled for GYN procedure on 05/07/22 and is feeling very anxious. When she had her pre-op appointment yesterday they recommended that she call her PCP and request some Xanax that she can take leading up to the procedure on Tuesday. Patient requests this be sent to  [id number] [place] [place]  She is very anxious and would like a call back as soon as possible at [phone]  Her last OV with PCP was 03-06-22

## 2022-05-03 NOTE — Telephone Encounter (Signed)
Pt c/o medication issue:  1. Name of Medication: Xanax  2. How are you currently taking this medication (dosage and times per day)?   3. Are you having a reaction (difficulty breathing--STAT)? No  4. What is your medication issue? Pt stated they cannot get a hold of their PCP and would like the nurse to call her about how she can get the prescription for an upcoming procedure that is scheduled. The hospital yesterday told the pt she needs a prescription before her procedure to help with her anxiety.

## 2022-05-03 NOTE — Telephone Encounter (Signed)
Spoke to patient . Patient speaking very fast,very concerned she is not able to have something for anxiety prescribed.   Patient states the pre -admission nurse  recommend patient to call her primary Dr Ronnald Ramp to prescribe Xanax for procedure Hysteroscopy and D&C on 05/07/22. Per patient ,she called office and Dr Ronnald Ramp is not available and out of office- ( another provider will not prescribe)   Patient states she would like for Dr Percival Spanish to contact primary office- and suggest that prescription be written because Dr Percival Spanish is aware of her history with anxiety or if Dr Percival Spanish can give her a prescription.  She states she is in between finding a Teacher, music .  RN  informed patient, Dr Percival Spanish is not in office today and he more  than probably would not contact Dr Ronnald Ramp. RN suggest patient to contact  the doctor who is doing the procedure or the GYN ( Dr Damita Dunnings) ,who sent patient to the doctor, Dr Margreta Journey who is doing the procedures.   Patient verbalized understanding ,but  states she will be getting anxious now it will probably get worse over the weekend. She was adamant a message be  sent to Dr Percival Spanish. But again she staes she would  contact both GYN's

## 2022-05-06 ENCOUNTER — Telehealth: Payer: Self-pay | Admitting: *Deleted

## 2022-05-06 ENCOUNTER — Other Ambulatory Visit: Payer: Self-pay | Admitting: Internal Medicine

## 2022-05-06 DIAGNOSIS — F40232 Fear of other medical care: Secondary | ICD-10-CM | POA: Insufficient documentation

## 2022-05-06 MED ORDER — DIAZEPAM 5 MG PO TABS: 5.0000 mg | ORAL_TABLET | Freq: Once | ORAL | 0 refills | Status: AC

## 2022-05-06 NOTE — Telephone Encounter (Signed)
The  request was handled

## 2022-05-06 NOTE — Telephone Encounter (Signed)
-----   Message from Minus Breeding, MD sent at 05/05/2022  4:04 PM EDT ----- Regarding: RE: Cardiac clearance  Dr. Currie Paris,  We have a preop pool that this usually goes through but I can offer the clearance.  I will ask Hilda Blades to make this part of the chart as staff messages are not part of the chart.   Ms. Smolinski has no untreated high risk symptoms or physical findings that would preclude the surgery as indicated.  She is at moderate to high risk for cardiovascular complications because of weight and her systolic and diastolic dysfunction.  Careful attention should be paid to volume management perioperatively and postoperatively.  However, according to ACC/AHA guidelines, no further cardiovascular testing preop would obviate the risk and is not indicated prior to the procedure.    Please be sure to have this conversation with the patient preoperatively.  If further preop discussion si required from our standpoint we would be happy to schedule an appt.  Thank you.  Dr. Percival Spanish  ----- Message ----- From: Darliss Cheney, MD Sent: 05/03/2022   2:28 PM EDT To: Minus Breeding, MD Subject: Cardiac clearance                              Hello, My name is Dr Currie Paris and I am one the gynecologists caring for this patient. She is scheduled for a hysteroscopy, D&C on Tuesday 3/12. Anesthesia noted she has a last documented EF of 30-35% and require cardiac clearance prior to proceeding. Your review and input are greatly appreciated. A letter has been sent as well.  Best,  Dr Currie Paris

## 2022-05-06 NOTE — Anesthesia Preprocedure Evaluation (Signed)
Anesthesia Evaluation  Patient identified by MRN, date of birth, ID band Patient awake    Reviewed: Allergy & Precautions, NPO status , Patient's Chart, lab work & pertinent test results, reviewed documented beta blocker date and time   Airway Mallampati: III  TM Distance: >3 FB Neck ROM: Full    Dental  (+) Dental Advisory Given, Missing   Pulmonary sleep apnea , former smoker   Pulmonary exam normal breath sounds clear to auscultation       Cardiovascular hypertension, Pt. on home beta blockers and Pt. on medications +CHF  Normal cardiovascular exam Rhythm:Regular Rate:Normal     Neuro/Psych  Headaches PSYCHIATRIC DISORDERS Anxiety Depression       GI/Hepatic negative GI ROS, Neg liver ROS,,,  Endo/Other  diabetes, Type 2  Morbid obesity (BMI 52)  Renal/GU negative Renal ROS     Musculoskeletal  (+) Arthritis ,    Abdominal   Peds  (+) ATTENTION DEFICIT DISORDER WITHOUT HYPERACTIVITY Hematology negative hematology ROS (+)   Anesthesia Other Findings Day of surgery medications reviewed with the patient.  Reproductive/Obstetrics PMB                             Anesthesia Physical Anesthesia Plan  ASA: 4  Anesthesia Plan: General   Post-op Pain Management: Tylenol PO (pre-op)*   Induction: Intravenous  PONV Risk Score and Plan: 4 or greater and Midazolam, Dexamethasone and Ondansetron  Airway Management Planned: LMA  Additional Equipment:   Intra-op Plan:   Post-operative Plan: Extubation in OR  Informed Consent: I have reviewed the patients History and Physical, chart, labs and discussed the procedure including the risks, benefits and alternatives for the proposed anesthesia with the patient or authorized representative who has indicated his/her understanding and acceptance.     Dental advisory given  Plan Discussed with: CRNA  Anesthesia Plan Comments: (Eomidate for  induction  PAT note by Karoline Caldwell, PA-C:  Patient follows with cardiology for history of NICM, chronic systolic heart failure, HTN, baseline DOE.  EF 30 to 35% by echo 06/2021.  Dr. Currie Paris reached out directly to Dr. Percival Spanish for preop clearance.  Dr. Percival Spanish responded on 05/05/2022 stating, "She is at moderate to high risk for cardiovascular complications because of weight and her systolic and diastolic dysfunction. Careful attention should be paid to volume management perioperatively and postoperatively. However, according to ACC/AHA guidelines, no further cardiovascular testing preop would obviate the risk and is not indicated prior to the procedure."  History of OSA, intolerant to CPAP.  Non-insulin-dependent DM2, A1c 6.5 on 05/02/2022.  Preop labs reviewed, unremarkable.  EKG 02/25/2022 (EKG done in setting of sepsis secondary to UTI): Sinus tachycardia.  Rate 117. Multiform ventricular premature complexes. Nonspecific IVCD with LAD EKG 02/07/2022: NSR.  Rate 83.  LAD.  Voltage criteria for LVH.  TTE 07/10/21: 1. Left ventricular ejection fraction, by estimation, is 30 to 35%. Left  ventricular ejection fraction by 3D volume is 32 %. The left ventricle has  moderately decreased function. The left ventricle demonstrates global  hypokinesis. The left ventricular  internal cavity size was moderately dilated. Left ventricular diastolic  parameters are consistent with Grade I diastolic dysfunction (impaired  relaxation).  2. Right ventricular systolic function is normal. The right ventricular  size is normal. Tricuspid regurgitation signal is inadequate for assessing  PA pressure.  3. The mitral valve is grossly normal. Trivial mitral valve  regurgitation.  4. The aortic valve is  tricuspid. Aortic valve regurgitation is not  visualized.  5. The inferior vena cava is normal in size with <50% respiratory  variability, suggesting right atrial pressure of 8 mmHg.   Comparison(s):  12/30/18 EF 50-55%.   Cardiopulmonary exercise test 09/25/2017: Conclusion: The interpretation of this test is limited due to submaximal effort during the exercise. Based on available data, exercise testing with gas exchange demonstrates normal functional capacity when compared to matched sedentary norms. However, patient desaturated with exertion to a low of 88% at peak exercise, which is likely a primary factor of her symptoms. Patient is also morbidly obese and is severely limited due to her body habitus and related restrictive lung physiology. Please correlate clinically.   Right/left heart cath 04/11/2016: ? There is moderate left ventricular systolic dysfunction. ? LV end diastolic pressure is moderately elevated. ? The left ventricular ejection fraction is 25-35% by visual estimate. ? There is no mitral valve regurgitation. ? Hemodynamic findings consistent with mild pulmonary hypertension.  1. No angiographic evidence of CAD 2. Non-ischemic cardiomyopathy with elevated filling pressures.   Recommendations: I will have her increase her Lasix to 40 mg po BID until she is seen in follow up in 1 week in our office.   )        Anesthesia Quick Evaluation

## 2022-05-06 NOTE — Progress Notes (Signed)
Anesthesia Chart Review:  Patient follows with cardiology for history of NICM, chronic systolic heart failure, HTN, baseline DOE.  EF 30 to 35% by echo 06/2021.  Dr. Currie Paris reached out directly to Dr. Percival Spanish for preop clearance.  Dr. Percival Spanish responded on 05/05/2022 stating, "She is at moderate to high risk for cardiovascular complications because of weight and her systolic and diastolic dysfunction. Careful attention should be paid to volume management perioperatively and postoperatively. However, according to ACC/AHA guidelines, no further cardiovascular testing preop would obviate the risk and is not indicated prior to the procedure."  History of OSA, intolerant to CPAP.  Non-insulin-dependent DM2, A1c 6.5 on 05/02/2022.  Preop labs reviewed, unremarkable.  EKG 02/25/2022 (EKG done in setting of sepsis secondary to UTI): Sinus tachycardia.  Rate 117. Multiform ventricular premature complexes. Nonspecific IVCD with LAD EKG 02/07/2022: NSR.  Rate 83.  LAD.  Voltage criteria for LVH.  TTE 07/10/21:  1. Left ventricular ejection fraction, by estimation, is 30 to 35%. Left  ventricular ejection fraction by 3D volume is 32 %. The left ventricle has  moderately decreased function. The left ventricle demonstrates global  hypokinesis. The left ventricular  internal cavity size was moderately dilated. Left ventricular diastolic  parameters are consistent with Grade I diastolic dysfunction (impaired  relaxation).   2. Right ventricular systolic function is normal. The right ventricular  size is normal. Tricuspid regurgitation signal is inadequate for assessing  PA pressure.   3. The mitral valve is grossly normal. Trivial mitral valve  regurgitation.   4. The aortic valve is tricuspid. Aortic valve regurgitation is not  visualized.   5. The inferior vena cava is normal in size with <50% respiratory  variability, suggesting right atrial pressure of 8 mmHg.   Comparison(s): 12/30/18 EF 50-55%.    Cardiopulmonary exercise test 09/25/2017: Conclusion: The interpretation of this test is limited due to submaximal effort during the exercise. Based on available data, exercise testing with gas exchange demonstrates normal functional capacity when compared to matched sedentary norms. However, patient desaturated with exertion to a low of 88% at peak exercise, which is likely a primary factor of her symptoms. Patient is also morbidly obese and is severely limited due to her body habitus and related restrictive lung physiology. Please correlate clinically.   Right/left heart cath 04/11/2016: There is moderate left ventricular systolic dysfunction. LV end diastolic pressure is moderately elevated. The left ventricular ejection fraction is 25-35% by visual estimate. There is no mitral valve regurgitation. Hemodynamic findings consistent with mild pulmonary hypertension.   1. No angiographic evidence of CAD 2. Non-ischemic cardiomyopathy with elevated filling pressures.    Recommendations: I will have her increase her Lasix to 40 mg po BID until she is seen in follow up in 1 week in our office.     Wynonia Musty [facility] Short Stay Center/Anesthesiology Phone [phone] 05/06/2022 9:38 AM

## 2022-05-07 ENCOUNTER — Ambulatory Visit (HOSPITAL_COMMUNITY): Payer: Medicare Other | Admitting: Physician Assistant

## 2022-05-07 ENCOUNTER — Ambulatory Visit (HOSPITAL_COMMUNITY)
Admission: RE | Admit: 2022-05-07 | Discharge: 2022-05-07 | Disposition: A | Discharge status: Home / Self Care | Payer: Medicare Other | Source: Ambulatory Visit | Attending: Obstetrics and Gynecology | Admitting: Obstetrics and Gynecology

## 2022-05-07 ENCOUNTER — Other Ambulatory Visit: Payer: Self-pay

## 2022-05-07 ENCOUNTER — Encounter (HOSPITAL_COMMUNITY): Payer: Self-pay | Admitting: Obstetrics and Gynecology

## 2022-05-07 ENCOUNTER — Encounter (HOSPITAL_COMMUNITY)
Admission: RE | Disposition: A | Discharge status: Home / Self Care | Payer: Self-pay | Source: Ambulatory Visit | Attending: Obstetrics and Gynecology

## 2022-05-07 ENCOUNTER — Ambulatory Visit (HOSPITAL_BASED_OUTPATIENT_CLINIC_OR_DEPARTMENT_OTHER): Payer: Medicare Other | Admitting: Physician Assistant

## 2022-05-07 DIAGNOSIS — N950 Postmenopausal bleeding: Secondary | ICD-10-CM

## 2022-05-07 DIAGNOSIS — E119 Type 2 diabetes mellitus without complications: Secondary | ICD-10-CM | POA: Diagnosis not present

## 2022-05-07 DIAGNOSIS — Z87891 Personal history of nicotine dependence: Secondary | ICD-10-CM | POA: Insufficient documentation

## 2022-05-07 DIAGNOSIS — I509 Heart failure, unspecified: Secondary | ICD-10-CM | POA: Diagnosis not present

## 2022-05-07 DIAGNOSIS — G4730 Sleep apnea, unspecified: Secondary | ICD-10-CM | POA: Diagnosis not present

## 2022-05-07 DIAGNOSIS — I110 Hypertensive heart disease with heart failure: Secondary | ICD-10-CM

## 2022-05-07 DIAGNOSIS — Z6841 Body Mass Index (BMI) 40.0 and over, adult: Secondary | ICD-10-CM | POA: Insufficient documentation

## 2022-05-07 DIAGNOSIS — N939 Abnormal uterine and vaginal bleeding, unspecified: Secondary | ICD-10-CM

## 2022-05-07 DIAGNOSIS — N840 Polyp of corpus uteri: Secondary | ICD-10-CM

## 2022-05-07 DIAGNOSIS — Z01818 Encounter for other preprocedural examination: Secondary | ICD-10-CM

## 2022-05-07 DIAGNOSIS — I10 Essential (primary) hypertension: Secondary | ICD-10-CM | POA: Diagnosis not present

## 2022-05-07 HISTORY — PX: HYSTEROSCOPY WITH D & C: SHX1775

## 2022-05-07 LAB — GLUCOSE, CAPILLARY
Glucose-Capillary: 160 mg/dL — ABNORMAL HIGH (ref 70–99)
Glucose-Capillary: 135 mg/dL — ABNORMAL HIGH (ref 70–99)
Glucose-Capillary: 115 mg/dL — ABNORMAL HIGH (ref 70–99)

## 2022-05-07 LAB — POCT PREGNANCY, URINE: Preg Test, Ur: NEGATIVE

## 2022-05-07 SURGERY — DILATATION AND CURETTAGE /HYSTEROSCOPY
Anesthesia: General | Site: Abdomen

## 2022-05-07 MED ORDER — LACTATED RINGERS IV SOLN: INTRAVENOUS | Status: DC

## 2022-05-07 MED ORDER — FENTANYL CITRATE (PF) 250 MCG/5ML IJ SOLN
INTRAMUSCULAR | Status: DC | PRN
  Administered 2022-05-07: 50 ug via INTRAVENOUS

## 2022-05-07 MED ORDER — DEXAMETHASONE SODIUM PHOSPHATE 10 MG/ML IJ SOLN
INTRAMUSCULAR | Status: DC | PRN
  Administered 2022-05-07: 10 mg via INTRAVENOUS

## 2022-05-07 MED ORDER — SODIUM CHLORIDE 0.9 % IR SOLN
Status: DC | PRN
  Administered 2022-05-07: 3000 mL

## 2022-05-07 MED ORDER — FENTANYL CITRATE (PF) 100 MCG/2ML IJ SOLN: 25.0000 ug | INTRAMUSCULAR | Status: DC | PRN

## 2022-05-07 MED ORDER — ETOMIDATE 2 MG/ML IV SOLN
INTRAVENOUS | Status: DC | PRN
  Administered 2022-05-07: 20 mg via INTRAVENOUS

## 2022-05-07 MED ORDER — BUPIVACAINE HCL (PF) 0.5 % IJ SOLN
INTRAMUSCULAR | Status: DC | PRN
  Administered 2022-05-07: 20 mL

## 2022-05-07 MED ORDER — ORAL CARE MOUTH RINSE: 15.0000 mL | Freq: Once | OROMUCOSAL | Status: AC

## 2022-05-07 MED ORDER — CHLORHEXIDINE GLUCONATE 0.12 % MT SOLN
15.0000 mL | Freq: Once | OROMUCOSAL | Status: AC
  Administered 2022-05-07: 15 mL via OROMUCOSAL
  Filled 2022-05-07: qty 15

## 2022-05-07 MED ORDER — LIDOCAINE 2% (20 MG/ML) 5 ML SYRINGE
INTRAMUSCULAR | Status: DC | PRN
  Administered 2022-05-07: 100 mg via INTRAVENOUS

## 2022-05-07 MED ORDER — FENTANYL CITRATE (PF) 250 MCG/5ML IJ SOLN
INTRAMUSCULAR | Status: AC
  Filled 2022-05-07: qty 5

## 2022-05-07 MED ORDER — MIDAZOLAM HCL 2 MG/2ML IJ SOLN
INTRAMUSCULAR | Status: AC
  Filled 2022-05-07: qty 2

## 2022-05-07 MED ORDER — ONDANSETRON HCL 4 MG/2ML IJ SOLN: 4.0000 mg | Freq: Once | INTRAMUSCULAR | Status: DC | PRN

## 2022-05-07 MED ORDER — SODIUM CHLORIDE 0.9 % IR SOLN
Status: DC | PRN
  Administered 2022-05-07: 1000 mL

## 2022-05-07 MED ORDER — INSULIN ASPART 100 UNIT/ML IJ SOLN
0.0000 [IU] | INTRAMUSCULAR | Status: DC | PRN
  Filled 2022-05-07: qty 1

## 2022-05-07 MED ORDER — DEXAMETHASONE SODIUM PHOSPHATE 10 MG/ML IJ SOLN
INTRAMUSCULAR | Status: AC
  Filled 2022-05-07: qty 1

## 2022-05-07 MED ORDER — ETOMIDATE 2 MG/ML IV SOLN
INTRAVENOUS | Status: AC
  Filled 2022-05-07: qty 10

## 2022-05-07 MED ORDER — ACETAMINOPHEN 500 MG PO TABS
1000.0000 mg | ORAL_TABLET | ORAL | Status: AC
  Administered 2022-05-07: 1000 mg via ORAL
  Filled 2022-05-07: qty 2

## 2022-05-07 MED ORDER — ONDANSETRON HCL 4 MG/2ML IJ SOLN
INTRAMUSCULAR | Status: DC | PRN
  Administered 2022-05-07: 4 mg via INTRAVENOUS

## 2022-05-07 MED ORDER — BUPIVACAINE HCL (PF) 0.5 % IJ SOLN
INTRAMUSCULAR | Status: AC
  Filled 2022-05-07: qty 30

## 2022-05-07 MED ORDER — PROPOFOL 10 MG/ML IV BOLUS
INTRAVENOUS | Status: AC
  Filled 2022-05-07: qty 20

## 2022-05-07 MED ORDER — ONDANSETRON HCL 4 MG/2ML IJ SOLN
INTRAMUSCULAR | Status: AC
  Filled 2022-05-07: qty 2

## 2022-05-07 MED ORDER — MIDAZOLAM HCL 2 MG/2ML IJ SOLN
INTRAMUSCULAR | Status: DC | PRN
  Administered 2022-05-07 (×2): 1 mg via INTRAVENOUS

## 2022-05-07 MED ORDER — LIDOCAINE 2% (20 MG/ML) 5 ML SYRINGE
INTRAMUSCULAR | Status: AC
  Filled 2022-05-07: qty 5

## 2022-05-07 SURGICAL SUPPLY — 19 items
CATH ROBINSON RED A/P 16FR (CATHETERS) ×2 IMPLANT
DEVICE MYOSURE LITE (MISCELLANEOUS) IMPLANT
DEVICE MYOSURE REACH (MISCELLANEOUS) IMPLANT
DRSG TELFA 3X8 NADH STRL (GAUZE/BANDAGES/DRESSINGS) ×2 IMPLANT
GAUZE 4X4 16PLY ~~LOC~~+RFID DBL (SPONGE) ×2 IMPLANT
GLOVE BIO SURGEON STRL SZ7 (GLOVE) ×2 IMPLANT
GLOVE BIOGEL PI IND STRL 7.0 (GLOVE) IMPLANT
GOWN STRL REUS W/TWL LRG LVL3 (GOWN DISPOSABLE) IMPLANT
GOWN STRL REUS W/TWL XL LVL3 (GOWN DISPOSABLE) ×2 IMPLANT
KIT PROCEDURE FLUENT (KITS) ×2 IMPLANT
KIT TURNOVER CYSTO (KITS) ×2 IMPLANT
LOOP CUTTING BIPOLAR 21FR (ELECTRODE) IMPLANT
MYOSURE XL FIBROID (MISCELLANEOUS)
PACK VAGINAL MINOR WOMEN LF (CUSTOM PROCEDURE TRAY) ×2 IMPLANT
PAD OB MATERNITY 4.3X12.25 (PERSONAL CARE ITEMS) ×2 IMPLANT
SEAL CERVICAL OMNI LOK (ABLATOR) IMPLANT
SEAL ROD LENS SCOPE MYOSURE (ABLATOR) ×2 IMPLANT
SLEEVE SCD COMPRESS KNEE MED (STOCKING) ×2 IMPLANT
SYSTEM TISS REMOVAL MYOSURE XL (MISCELLANEOUS) IMPLANT

## 2022-05-07 NOTE — Op Note (Addendum)
Preoperative diagnoses:  abnormal uterine bleeding  Postoperative diagnosis: Same  Procedure: D & C, hysteroscopy, polypectomy  Surgeon: Darliss Cheney, MD  Anesthesia: Santa Lighter, MD  Findings:  normal appearing cervix, endometrial cavity atrophic with polypoid lesion on posterior wall near fundus, bilateral tubal ostia visualized  Fluid deficit:160cc normal saline  Estimated blood loss: 5 ml  Pathology: Endometrial curettings   Indications for procedure: 57 y.o. G2P1011 with history of postmenopausal bleeding who presents for operative hysteroscopy, dilation and curettage  Procedure: The patient was taken to the operating room where spinal analgesia was inserted. SCDs were in place.  Time out was performed. Patient was placed in dorsolithotomy in [place]. She was prepped and draped in the usual sterile fashion. A Red Rubber catheter was used to drain her bladder. A speculum was placeed in the vagina. The cervix was visualized anteriorly and grasped with a single-tooth tenaculum. Paracervical block was performed with 0.5% bupivicaine with 20 cc injected. Sequential dilation was performed with Kennon Rounds dilators. The hysteroscope was inserted and the endometrial cavity and inspected. There was a polypoid lesions noted in the endometrial cavity with both ostia seen.  The myosure lite was then introduced into the cavity and the polypoid lesion resected completely and removal of surrounding polypoid tissue within the endometrial cavity. The hysteroscope was removed. Sharp curettage was performed in all 4 quadrants. All instruments were removed from the vagina. All instrument, needle and lap counts were correct x2. The patient was awakened and is recovering in stable condition.  Darliss Cheney MD 05/07/2022 11:54 AM

## 2022-05-07 NOTE — Anesthesia Procedure Notes (Signed)
Procedure Name: LMA Insertion Date/Time: 05/07/2022 11:15 AM  Performed by: Maude Leriche, CRNAPre-anesthesia Checklist: Patient identified, Emergency Drugs available, Suction available, Patient being monitored and Timeout performed Patient Re-evaluated:Patient Re-evaluated prior to induction Oxygen Delivery Method: Circle system utilized Preoxygenation: Pre-oxygenation with 100% oxygen Induction Type: IV induction LMA: LMA inserted LMA Size: 4.0 Placement Confirmation: positive ETCO2 and breath sounds checked- equal and bilateral Tube secured with: Tape Dental Injury: Teeth and Oropharynx as per pre-operative assessment

## 2022-05-07 NOTE — Discharge Instructions (Signed)
We will see how your bleeding is going forward. You can expect some light bleeding and watery discharge for the next few days. The pathology should come back within a week. We will review your pathology at your follow up appointment. You can stop the megace for now so that we can see how your bleeding is.

## 2022-05-07 NOTE — Interval H&P Note (Signed)
History and Physical Interval Note:  05/07/2022 10:42 AM  [name]  has presented today for surgery, with the diagnosis of PMB.  The various methods of treatment have been discussed with the patient and family. After consideration of risks, benefits and other options for treatment, the patient has consented to  Procedure(s): DILATATION AND CURETTAGE /HYSTEROSCOPY (N/A) as a surgical intervention.  The patient's history has been reviewed, patient examined, no change in status, stable for surgery.  I have reviewed the patient's chart and labs.  Questions were answered to the patient's satisfaction.     [name]

## 2022-05-07 NOTE — Anesthesia Postprocedure Evaluation (Signed)
Anesthesia Post Note  Patient: [name]  Procedure(s) Performed: DILATATION AND CURETTAGE /HYSTEROSCOPY (Abdomen)     Patient location during evaluation: PACU Anesthesia Type: General Level of consciousness: awake and alert Pain management: pain level controlled Vital Signs Assessment: post-procedure vital signs reviewed and stable Respiratory status: spontaneous breathing, nonlabored ventilation, respiratory function stable and patient connected to nasal cannula oxygen Cardiovascular status: blood pressure returned to baseline and stable Postop Assessment: no apparent nausea or vomiting Anesthetic complications: no   No notable events documented.  Last Vitals:  Vitals:   05/07/22 1215 05/07/22 1230  BP: (!) 148/82 (!) 145/85  Pulse: 67 69  Resp: 14 14  Temp:  36.7 C  SpO2: 95% 95%    Last Pain:  Vitals:   05/07/22 1230  TempSrc:   PainSc: [place]

## 2022-05-07 NOTE — Transfer of Care (Signed)
Immediate Anesthesia Transfer of Care Note  Patient: [name]  Procedure(s) Performed: DILATATION AND CURETTAGE /HYSTEROSCOPY (Abdomen)  Patient Location: PACU  Anesthesia Type:General  Level of Consciousness: awake, alert , and oriented  Airway & Oxygen Therapy: Patient Spontanous Breathing  Post-op Assessment: Report given to RN, Post -op Vital signs reviewed and stable, Patient moving all extremities X 4, and Patient able to stick tongue midline  Post vital signs: Reviewed  Last Vitals:  Vitals Value Taken Time  BP 146/110 05/07/22 1158  Temp 97.6   Pulse 81 05/07/22 1159  Resp 15 05/07/22 1159  SpO2 99 % 05/07/22 1159  Vitals shown include unvalidated device data.  Last Pain:  Vitals:   05/07/22 0805  TempSrc: Oral  PainSc: 9       Patients Stated Pain Goal: 3 ([id number])  Complications: No notable events documented.

## 2022-05-07 NOTE — Brief Op Note (Signed)
05/07/2022  11:47 AM  PATIENT:  [name]  57 y.o. female  PRE-OPERATIVE DIAGNOSIS:  Postmenopausal bleeding  POST-OPERATIVE DIAGNOSIS:  Postmenopausal bleeding  PROCEDURE:  Procedure(s): DILATATION AND CURETTAGE /HYSTEROSCOPY (N/A)  SURGEON:  Surgeon(s) and Role:    Darliss Cheney, MD - Primary  PHYSICIAN ASSISTANT: none  ASSISTANTS: none   ANESTHESIA:   general and paracervical block  EBL:  5cc   BLOOD ADMINISTERED:none  DRAINS: none   LOCAL MEDICATIONS USED:  BUPIVICAINE   SPECIMEN:  Source of Specimen:  endometrial curettings  DISPOSITION OF SPECIMEN:  PATHOLOGY  COUNTS:  YES  TOURNIQUET:  * No tourniquets in log *  DICTATION: .Note written in EPIC  PLAN OF CARE: Discharge to home after PACU  PATIENT DISPOSITION:  PACU - hemodynamically stable.   Delay start of Pharmacological VTE agent (>24hrs) due to surgical blood loss or risk of bleeding: not applicable

## 2022-05-08 ENCOUNTER — Encounter (HOSPITAL_COMMUNITY): Payer: Self-pay | Admitting: Obstetrics and Gynecology

## 2022-05-08 LAB — SURGICAL PATHOLOGY

## 2022-05-10 ENCOUNTER — Encounter (HOSPITAL_BASED_OUTPATIENT_CLINIC_OR_DEPARTMENT_OTHER): Payer: Self-pay | Admitting: Physical Therapy

## 2022-05-10 ENCOUNTER — Ambulatory Visit (HOSPITAL_BASED_OUTPATIENT_CLINIC_OR_DEPARTMENT_OTHER): Payer: Medicare Other | Attending: Family Medicine | Admitting: Physical Therapy

## 2022-05-10 ENCOUNTER — Other Ambulatory Visit: Payer: Self-pay

## 2022-05-10 DIAGNOSIS — M6281 Muscle weakness (generalized): Secondary | ICD-10-CM

## 2022-05-10 DIAGNOSIS — M545 Low back pain, unspecified: Secondary | ICD-10-CM | POA: Insufficient documentation

## 2022-05-10 DIAGNOSIS — G8929 Other chronic pain: Secondary | ICD-10-CM | POA: Insufficient documentation

## 2022-05-10 DIAGNOSIS — M25561 Pain in right knee: Secondary | ICD-10-CM | POA: Diagnosis not present

## 2022-05-10 DIAGNOSIS — M5459 Other low back pain: Secondary | ICD-10-CM
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
## 2022-05-13 ENCOUNTER — Telehealth: Payer: Self-pay

## 2022-05-13 NOTE — Telephone Encounter (Signed)
Pt requested call because she is having pain and has been bleeding for months.  Pt reports that after the procedure it has progressed since having a procedure.  Per chart review pt has an appt scheduled in two days on Thursday to f/u with Aje[facility].  Pt advised that she can start taking the Megace for the bleeding, ibuprofen every 8 hours for pain, and that we will see her on Thursday.  Pt verbalized understanding.  Leonette Nutting  05/14/22

## 2022-05-15 ENCOUNTER — Ambulatory Visit: Payer: Medicare Other | Admitting: Obstetrics & Gynecology

## 2022-05-15 ENCOUNTER — Encounter: Payer: Self-pay | Admitting: Obstetrics and Gynecology

## 2022-05-16 ENCOUNTER — Encounter: Payer: Self-pay | Admitting: Obstetrics and Gynecology

## 2022-05-16 ENCOUNTER — Other Ambulatory Visit (HOSPITAL_COMMUNITY)
Admission: RE | Admit: 2022-05-16 | Discharge: 2022-05-16 | Disposition: A | Discharge status: Home / Self Care | Payer: Medicare Other | Source: Ambulatory Visit | Attending: Obstetrics and Gynecology | Admitting: Obstetrics and Gynecology

## 2022-05-16 ENCOUNTER — Ambulatory Visit (INDEPENDENT_AMBULATORY_CARE_PROVIDER_SITE_OTHER): Payer: Medicare Other | Admitting: Obstetrics and Gynecology

## 2022-05-16 VITALS — BP 135/88 | HR 93 | Ht 64.0 in | Wt 306.0 lb

## 2022-05-16 DIAGNOSIS — N939 Abnormal uterine and vaginal bleeding, unspecified: Secondary | ICD-10-CM

## 2022-05-16 DIAGNOSIS — N950 Postmenopausal bleeding: Secondary | ICD-10-CM

## 2022-05-16 DIAGNOSIS — R5383 Other fatigue: Secondary | ICD-10-CM | POA: Diagnosis not present

## 2022-05-16 MED ORDER — FLUCONAZOLE 150 MG PO TABS: 150.0000 mg | ORAL_TABLET | Freq: Once | ORAL | 3 refills | Status: AC

## 2022-05-16 MED ORDER — IBUPROFEN 800 MG PO TABS: 800.0000 mg | ORAL_TABLET | Freq: Three times a day (TID) | ORAL | 3 refills | Status: DC | PRN

## 2022-05-16 MED ORDER — DOXYCYCLINE HYCLATE 100 MG PO CAPS: 100.0000 mg | ORAL_CAPSULE | Freq: Two times a day (BID) | ORAL | 0 refills | Status: AC

## 2022-05-16 MED ORDER — DOXYCYCLINE HYCLATE 100 MG PO CAPS: 100.0000 mg | ORAL_CAPSULE | Freq: Two times a day (BID) | ORAL | 0 refills | Status: DC

## 2022-05-16 NOTE — Patient Instructions (Addendum)
We are doing a few lab tests today in response to you having bleeding.   I have ordered a repeat ultrasound to look at your uterus and ovaries to better understand your bleeding.   I have placed a new prescription for your ibuprofen. You are safe to use a tampon and to start pool therapy   We are going to do a week of antibiotics, called doxycycline to see if that helps. Some people get a yeast infection in response to antibiotics so I have sent diflucan just in case.   If the bleeding remains an issue and we cannot find a way to medically solve this, then there can be a discussion regarding surgical treatment, but that would be a much more in depth conversation given your complex medical history and increased risk of having complications.
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
## 2022-05-17 LAB — CBC
Hematocrit: 43.3 % (ref 34.0–46.6)
Hemoglobin: 14.1 g/dL (ref 11.1–15.9)
MCH: 28.7 pg (ref 26.6–33.0)
MCHC: 32.6 g/dL (ref 31.5–35.7)
MCV: 88 fL (ref 79–97)
Platelets: 242 10*3/uL (ref 150–450)
RBC: 4.91 x10E6/uL (ref 3.77–5.28)
RDW: 14.2 % (ref 11.7–15.4)
WBC: 9.1 10*3/uL (ref 3.4–10.8)

## 2022-05-17 LAB — CERVICOVAGINAL ANCILLARY ONLY
Bacterial Vaginitis (gardnerella): NEGATIVE
Candida Glabrata: NEGATIVE
Candida Vaginitis: NEGATIVE
Comment: NEGATIVE
Comment: NEGATIVE
Comment: NEGATIVE

## 2022-05-17 LAB — TSH RFX ON ABNORMAL TO FREE T4: TSH: 2.13 u[IU]/mL (ref 0.450–4.500)

## 2022-05-21 ENCOUNTER — Ambulatory Visit (HOSPITAL_COMMUNITY)
Admission: RE | Admit: 2022-05-21 | Discharge: 2022-05-21 | Disposition: A | Discharge status: Home / Self Care | Payer: Medicare Other | Source: Ambulatory Visit | Attending: Obstetrics and Gynecology | Admitting: Obstetrics and Gynecology

## 2022-05-21 ENCOUNTER — Ambulatory Visit: Payer: Self-pay | Admitting: Licensed Clinical Social Worker

## 2022-05-21 ENCOUNTER — Telehealth: Payer: Self-pay | Admitting: *Deleted

## 2022-05-21 DIAGNOSIS — N950 Postmenopausal bleeding: Secondary | ICD-10-CM | POA: Diagnosis not present

## 2022-05-21 DIAGNOSIS — N939 Abnormal uterine and vaginal bleeding, unspecified: Secondary | ICD-10-CM | POA: Insufficient documentation

## 2022-05-21 NOTE — Telephone Encounter (Signed)
-----   Message from Darliss Cheney, MD sent at 05/17/2022  1:50 PM EDT ----- Please notify that vaginal swab negative for yeast and BV. Thyroid function is normal and blood count is also normal. Will see how her bleeding responds to most recent medication

## 2022-05-21 NOTE — Patient Instructions (Signed)
Visit Information  Thank you for taking time to visit with me today. Please don't hesitate to contact me if I can be of assistance to you.   Following are the goals we discussed today:   Goals Addressed             This Visit's Progress    Care Coordination Activities for mental health / physical health support       Activities in order to accomplish goals.   Continue with compliance of taking medication prescribed by Doctor I am sorry you were unable to have the appointment with [name] at [facility] that you continue to experience barriers with finding a mental health provider.    I have spoken with [name] ( REf. ID # [phone]) they have sent me a list of providers that are in network with your Medicaid.  I will review this list with you at our next encounter              Our next appointment is by telephone on 05/27/22   Please call the care guide team at [phone] if you need to cancel or reschedule your appointment.   If you are experiencing a Mental Health or [place] or need someone to talk to, please call the Suicide and Crisis Lifeline: 988 call the [place] National Suicide Prevention Lifeline: (707) 154-9852 or TTY: 248-025-7692 TTY 6044333837) to talk to a trained counselor call 1-800-273-TALK (toll free, 24 hour hotline) go to [facility] [address] [place] [phone])   Patient verbalizes understanding of instructions and care plan provided today and agrees to view in [place]. Active MyChart status and patient understanding of how to access instructions and care plan via MyChart confirmed with patient.     [name], [place] [phone]

## 2022-05-21 NOTE — Telephone Encounter (Signed)
I called and informed Daizie of results and recommendations per Dr. Currie Paris. She voices understanding. She also reports she is having bleeding and cramping and states the bleeding slows down then starts up again. I asked if she is taking the medications Dr. Currie Paris prescribed and she states yes  she is taking them , but because she is taking so many medications that she  is not taking ibuprofen and megace very often. She is taking the Doxycline. I explained the best for bleeding is the megace and the best for her pain in the ibuprofen but to only take every 8 hours and drink lots of water to help protect her kidneys. She is anxiously awaiting [place] results. I explained they are not back yet and Dr. Currie Paris will review as soon as she can and will probably have her call us with more information. I explained I will route this message to Dr. Currie Paris to update her. She voices understanding. Staci Acosta

## 2022-05-21 NOTE — Patient Outreach (Signed)
  Care Coordination  Follow Up Visit Note   05/21/2022 Name: [name] MRN: [id number] DOB: Nov 07, 1965  [name] is a 57 y.o. year old female who sees [name], MD for primary care. I spoke with  [name] by phone today.  What matters to the patients health and wellness today?  Getting mental health treatment  Patient continues to experience barriers with getting connected for mental health treatment  Goals Addressed             This Visit's Progress    Care Coordination Activities for mental health / physical health support       Activities in order to accomplish goals.   Continue with compliance of taking medication prescribed by Doctor I am sorry you were unable to have the appointment with [name] at [facility] that you continue to experience barriers with finding a mental health provider.    I have spoken with [name] ( REf. ID # [phone]) they have sent me a list of providers that are in network with your Medicaid.  I will review this list with you at our next encounter             SDOH assessments and interventions completed:  No   Care Coordination Interventions:  Yes, provided  Interventions Today    Flowsheet Row Most Recent Value  Chronic Disease   Chronic disease during today's visit Diabetes, Hypertension (HTN), Congestive Heart Failure (CHF)  General Interventions   General Interventions Discussed/Reviewed Communication with  Communication with PCP/Specialists  [facility] providers ( [place], and [place])  [name] will e-mail LCSW a list of providers that patient is able to [place] Discussed, Mental Health Reviewed, Coping Strategies, Anxiety, Depression  [solution  focused, emotional support, active listening, problem soloving]       Follow up plan: Follow up call scheduled for 05/27/22    Encounter  Outcome:  Pt. Visit Completed   [name], [place] [phone]

## 2022-05-22 DIAGNOSIS — D225 Melanocytic nevi of trunk: Secondary | ICD-10-CM | POA: Diagnosis not present

## 2022-05-22 DIAGNOSIS — D224 Melanocytic nevi of scalp and neck: Secondary | ICD-10-CM | POA: Diagnosis not present

## 2022-05-22 DIAGNOSIS — L821 Other seborrheic keratosis: Secondary | ICD-10-CM | POA: Diagnosis not present

## 2022-05-22 DIAGNOSIS — L218 Other seborrheic dermatitis: Secondary | ICD-10-CM | POA: Diagnosis not present

## 2022-05-22 DIAGNOSIS — B353 Tinea pedis: Secondary | ICD-10-CM | POA: Diagnosis not present

## 2022-05-22 DIAGNOSIS — L309 Dermatitis, unspecified: Secondary | ICD-10-CM | POA: Diagnosis not present

## 2022-05-27 ENCOUNTER — Ambulatory Visit: Payer: Self-pay | Admitting: Licensed Clinical Social Worker

## 2022-05-27 NOTE — Patient Instructions (Signed)
Visit Information  Thank you for taking time to visit with me today. Please don't hesitate to contact me if I can be of assistance to you.   Following are the goals we discussed today:   Goals Addressed             This Visit's Progress    Care Coordination Activities for mental health / physical health support       Activities in order to accomplish goals.   Continue with compliance of taking medication prescribed by Doctor I am sorry you were unable to have the appointment with [name] at [place] [facility] that you continue to experience barriers with finding a mental health provider.    I have spoken with [name] ( REf. ID # [phone]) they have sent me a list of providers that are in network with your Medicaid.  We review this list during your appointment.               Our next appointment is by telephone on 05/30/22 at 2:45  Please call the care guide team at [phone] if you need to cancel or reschedule your appointment.   If you are experiencing a Mental Health or [place] or need someone to talk to, please call the Suicide and Crisis Lifeline: 988 call the [place] National Suicide Prevention Lifeline: (765) 659-6052 or TTY: (614)222-7442 TTY (760)181-6040) to talk to a trained counselor call 1-800-273-TALK (toll free, 24 hour hotline) go to [facility] [place] ([phone])   The patient verbalized understanding of instructions, educational materials, and care plan provided today and DECLINED offer to receive copy of patient instructions, educational materials, and care plan.   [name], [place] [phone]

## 2022-05-27 NOTE — Patient Outreach (Signed)
  Care Coordination  Follow Up Visit Note   05/27/2022 Name: [name] MRN: [id number] DOB: [birth year]  [name] is a 57 y.o. year old female who sees Samantha Lima, MD for primary care. I spoke with  [name] by phone today.  What matters to the patients health [name] wellness today?  Finding a mental health provider.    Goals Addressed             This Visit's Progress    Care Coordination Activities for mental health / physical health support       Activities in order to accomplish goals.   Continue with compliance of taking medication prescribed by Doctor I am sorry you were unable to have the appointment with [name] at [name] that you continue to experience barriers with finding a mental health provider.    I have spoken with [name] ( REf. ID # [phone]) they have sent me a list of providers that are in network with your Medicaid.  We review this list during your appointment.              SDOH assessments [name] interventions completed:  No  Care Coordination Interventions:  Yes, provided  Interventions Today    Flowsheet Row Most Recent Value  Chronic Disease   Chronic disease during today's visit Hypertension (HTN), Diabetes, Congestive Heart Failure (CHF)  General Interventions   General Interventions Discussed/Reviewed General Interventions Reviewed, [employer], Doctor Visits, Communication with  Doctor Visits Discussed/Reviewed Specialist  [completed dental appointment ref: remove of teeth]  Communication with --  [mulit outpatient providers for appointments options]  Mental Health Interventions   Mental Health Discussed/Reviewed Coping Strategies, Mental Health Reviewed, Anxiety  [active listening, emotional support, solution focused,]       Follow up plan: Follow up call scheduled for 05/30/22    Encounter Outcome:  Pt. Visit Completed   [name], [place] [phone]

## 2022-05-28 ENCOUNTER — Telehealth: Payer: Self-pay

## 2022-05-28 NOTE — Telephone Encounter (Signed)
-----   Message from Darliss Cheney, MD sent at 05/28/2022 10:59 AM EDT ----- Regarding: contact patient Hello,  Can you contact patient and see if she is still bleeding? If she is still bleeding, will plan for a megace taper (take 3 for 5 days, 2 for 5 days, then 1 for 20 days) followed by provera for 2 weeks of the month until we get the bleeding to minimal to gone.  Thank you,  C Ajewole    Pt reports sporadic bleeding, pink here and there, and the cramping has di

## 2022-05-29 ENCOUNTER — Ambulatory Visit: Payer: Medicare Other | Admitting: Obstetrics and Gynecology

## 2022-05-29 NOTE — Telephone Encounter (Signed)
Called pt and pt informed me that she is not having any pain and scant bleeding.  Verified with Dr. Currie Paris who recommends that pt not start Megace as she is in stable condition.  Notified pt provider's recommendation and to call the office if she starts to have issues with bleeding.  Pt verbalized understanding with no further questions.   Frances Nickels  05/29/22

## 2022-05-30 ENCOUNTER — Ambulatory Visit: Payer: Medicare Other | Admitting: Obstetrics and Gynecology

## 2022-06-03 ENCOUNTER — Ambulatory Visit: Payer: Self-pay | Admitting: Licensed Clinical Social Worker

## 2022-06-03 DIAGNOSIS — F332 Major depressive disorder, recurrent severe without psychotic features: Secondary | ICD-10-CM

## 2022-06-03 DIAGNOSIS — F902 Attention-deficit hyperactivity disorder, combined type: Secondary | ICD-10-CM

## 2022-06-04 ENCOUNTER — Ambulatory Visit: Payer: Self-pay | Admitting: Licensed Clinical Social Worker

## 2022-06-04 NOTE — Patient Outreach (Signed)
  Care Coordination  Follow Up Visit Note   06/04/2022 Name: [name] MRN: [id number] DOB: 09-24-1965  [name] is a 57 y.o. year old female who sees [name], MD for primary care. I spoke with  [name] by phone today.  What matters to the patients health and wellness today?  Managing her health and mental health needs.  Patient continues to experience barriers with navigating her health needs( providers not taking her insurance, concerns with co-pays, providers not taking new patients, and unanswered questions about her Medicaid. Patient is very emotional, overwhelmed and frustrated with try to navigated her care.  Additional time spent with patient during this encounter.   Goals Addressed             This Visit's Progress    Care Coordination Activities for mental health / physical health support       Activities in order to accomplish goals.   Continue with compliance of taking medication prescribed by Doctor I am sorry that you continue to experience barriers with finding a mental health provider.   Keep medical appointment we reviewed today                SDOH assessments and interventions completed:  No  Care Coordination Interventions:  Yes, provided  Interventions Today    Flowsheet Row Most Recent Value  Chronic Disease   Chronic disease during today's visit Hypertension (HTN), Congestive Heart Failure (CHF), Diabetes  General Interventions   General Interventions Discussed/Reviewed General Interventions Reviewed, [employer], Communication with  Communication with --  [facility], [employer], [employer], [employer] ,  [facility]. spoke to Ulen at front desk]  Education Interventions   Education Provided Provided Education  Provided Verbal Education On [employer], Development worker, community, Mental Health/Coping with Illness  Mental Health Interventions   Mental Health  Discussed/Reviewed Mental Health Discussed, Coping Strategies, Anxiety, Depression, Crisis  [trying to find a mental health provider]  Safety Interventions   Safety Discussed/Reviewed Safety Reviewed       Follow up plan:  Referral made to [facility] LCSW will f/u with patient in 2 to 5 days.  Will continue to obtain information to assist patient with barriers to care.    Encounter Outcome:  Pt. Visit Completed   [name], LCSW Social Work Care Coordination  [facility] [employer] [phone]

## 2022-06-04 NOTE — Patient Instructions (Signed)
Visit Information  Thank you for taking time to visit with me today. Please don't hesitate to contact me if I can be of assistance to you.   Following are the goals we discussed today:   Goals Addressed             This Visit's Progress    Care Coordination Activities for mental health / physical health support       Activities in order to accomplish goals.   Continue with compliance of taking medication prescribed by Doctor I am sorry that you continue to experience barriers with finding a mental health provider.   Keep medical appointment we reviewed today for physical therapy, I called to confirm you will not have a co-pay for this visit. You wanted to find out more information about your Medicaid ( per DHHS we will need to talk to your local DSS worker [name]) You would also like to find a mental health provider where you do not require a co-pay.  I have called and placed a referral with [facility]                Please call the care guide team at [phone] if you need to cancel or reschedule your appointment.   If you are experiencing a Mental Health or Behavioral Health Crisis or need someone to talk to, please call the Suicide and Crisis Lifeline: 988 call the [place] National Suicide Prevention Lifeline: 951 070 9535 or TTY: 343-769-1510 TTY 419-686-3024) to talk to a trained counselor call 1-800-273-TALK (toll free, 24 hour hotline) go to [facility] [address], [place] [phone])   The patient verbalized understanding of instructions, educational materials, and care plan provided today and DECLINED offer to receive copy of patient instructions, educational materials, and care plan.   LCSW will continue to provide ongoing support  [name], [employer] Social Work Care Coordination  [facility] [employer] [phone]

## 2022-06-04 NOTE — Patient Instructions (Signed)
Visit Information  Thank you for taking time to visit with me today. Please don't hesitate to contact me if I can be of assistance to you.   Following are the goals we discussed today:   Goals Addressed             This Visit's Progress    Care Coordination Activities for mental health / physical health support       Activities in order to accomplish goals.   Continue with compliance of taking medication prescribed by Doctor I am sorry that you continue to experience barriers with finding a mental health provider.   Keep medical appointment we reviewed today for physical therapy, I called to confirm you will not have a co-pay for this visit. You wanted to find out more information about your Medicaid ( per DHHS we will need to talk to your local DSS worker [name]) I have placed a referral with [facility] we have also reviewed and discussed the walk-in days and hours   Call [name] at the [facility]  [phone] for Medicare options                 Our next appointment is by telephone on 06/17/22 at 2:00  Please call the care guide team at [phone] if you need to cancel or reschedule your appointment.   If you are experiencing a Mental Health or Behavioral Health Crisis or need someone to talk to, please call the Suicide and Crisis Lifeline: 988 call the [place] National Suicide Prevention Lifeline: (601)462-0763 or TTY: 657-115-8123 TTY 419-876-4205) to talk to a trained counselor call 1-800-273-TALK (toll free, 24 hour hotline) go to [facility] [address] [place] [phone])   The patient verbalized understanding of instructions, educational materials, and care plan provided today and DECLINED offer to receive copy of patient instructions, educational materials, and care plan.   [name], LCSW Social Work Care Coordination  [facility] [employer] [phone]

## 2022-06-04 NOTE — Patient Outreach (Signed)
  Care Coordination  Follow Up Visit Note   06/04/2022 Name: [name] MRN: [id number] DOB: 1966/01/13  [name] is a 57 y.o. year old female who sees [name], MD for primary care. I spoke with  [name] by phone today.  What matters to the patients health and wellness today?  Managing mental health   Patient is feeling better today and hopeful with getting connected to a mental health provider.  She will go to the walk in clinic if she starts to feel overwhelmed with her with her mood and emotions. Referral has been placed.   Goals Addressed             This Visit's Progress    Care Coordination Activities for mental health / physical health support       Activities in order to accomplish goals.   Continue with compliance of taking medication prescribed by Doctor I am sorry that you continue to experience barriers with finding a mental health provider.   Keep medical appointment we reviewed today for physical therapy, I called to confirm you will not have a co-pay for this visit. You wanted to find out more information about your Medicaid ( per DHHS we will need to talk to your local DSS worker [name]) I have placed a referral with [facility] we have also reviewed and discussed the walk-in days and hours   Call Samantha Braun at the [name]  [phone] for Medicare options                SDOH assessments and interventions completed:  Yes  Care Coordination Interventions:  Yes, provided  Interventions Today    Flowsheet Row Most Recent Value  Chronic Disease   Chronic disease during today's visit Diabetes, Hypertension (HTN), Congestive Heart Failure (CHF)  General Interventions   Communication with --  [facility],  [name], and [name]]  Education Interventions   Education Provided Provided Education  [employer] clinic days and time]  Provided Proofreader On Mental Health/Coping with Illness, Development worker, community  [reviewed benefits and [employer]]  Mental Health Interventions   Mental Health Discussed/Reviewed Mental Health Reviewed, Coping Strategies, Anxiety, Depression  [referral placed for [facility] and discussed walkin options,  solution focused, task centered]       Follow up plan: Follow up call scheduled for 06/17/22    Encounter Outcome:  Pt. Visit Completed   [name], LCSW Social Work Care Coordination  [facility] [employer] [phone]

## 2022-06-05 ENCOUNTER — Encounter (HOSPITAL_BASED_OUTPATIENT_CLINIC_OR_DEPARTMENT_OTHER): Payer: Self-pay | Admitting: Physical Therapy

## 2022-06-05 ENCOUNTER — Ambulatory Visit (HOSPITAL_BASED_OUTPATIENT_CLINIC_OR_DEPARTMENT_OTHER): Payer: Medicare Other | Attending: Family Medicine | Admitting: Physical Therapy

## 2022-06-05 DIAGNOSIS — M25561 Pain in right knee: Secondary | ICD-10-CM | POA: Diagnosis not present

## 2022-06-05 DIAGNOSIS — G8929 Other chronic pain: Secondary | ICD-10-CM | POA: Insufficient documentation

## 2022-06-05 DIAGNOSIS — M6281 Muscle weakness (generalized): Secondary | ICD-10-CM | POA: Insufficient documentation

## 2022-06-05 DIAGNOSIS — M5459 Other low back pain: Secondary | ICD-10-CM | POA: Insufficient documentation

## 2022-06-05 NOTE — Therapy (Signed)
OUTPATIENT PHYSICAL THERAPY THORACOLUMBAR TREATMENT   Patient Name: [name] MRN: [id number] DOB:12-29-1965, 57 y.o., female Today's Date: 06/05/2022  END OF SESSION:  PT End of Session - 06/05/22 0904     Visit Number 2    Number of Visits 18    Date for PT Re-Evaluation 07/19/22    Authorization Type MCR    PT Start Time 0902    PT Stop Time 0941    PT Time Calculation (min) 39 min             Past Medical History:  Diagnosis Date   ADD (attention deficit disorder)    Anxiety    Arthritis    both knees right worse than left   Carpal tunnel syndrome of right wrist    Depression    Essential hypertension, benign    Gallstones    History of smoking 06/22/2016   Hyperlipidemia associated with type 2 diabetes mellitus (HCC), on Zocor 04/04/2011   Hypertension associated with diabetes 06/03/2008   Migraines    Morbid obesity    Morbid obesity with BMI of 50.0-59.9, adult 07/12/2006   NICM (nonischemic cardiomyopathy) 07/12/2006   01/16/18 ECHO:    - Procedure narrative: Transthoracic echocardiography. Image   quality was suboptimal. The study was technically difficult.   Intravenous contrast (Definity) was administered. - Left ventricle: The cavity size was moderately dilated. Wall   thickness was increased in a pattern of mild LVH. Systolic   function was moderately to severely reduced. The estimated   ejection fraction w   Normal coronary arteries 04/17/2016   OSA on CPAP 10/28/2014   Spinal stenosis    back pain   Spondylosis without myelopathy or radiculopathy, lumbar region 12/04/2017   Type 2 diabetes mellitus with hyperglycemia    Past Surgical History:  Procedure Laterality Date   CHOLECYSTECTOMY     DILATATION & CURETTAGE/HYSTEROSCOPY WITH MYOSURE N/A 10/31/2017   Procedure: DILATATION & CURETTAGE/HYSTEROSCOPY WITH MYOSURE;  Surgeon: Romualdo Bolk, MD;  Location: [facility];  Service: Gynecology;  Laterality: N/A;   HYSTEROSCOPY WITH D & C N/A 05/07/2022    Procedure: DILATATION AND CURETTAGE /HYSTEROSCOPY;  Surgeon: [name], MD;  Location: [facility];  Service: Gynecology;  Laterality: N/A;   RIGHT/LEFT HEART CATH AND CORONARY ANGIOGRAPHY N/A 04/11/2016   Procedure: Right/Left Heart Cath and Coronary Angiography;  Surgeon: Kathleene Hazel, MD;  Location: [facility];  Service: Cardiovascular;  Laterality: N/A;   sonogram for blood clots     no blockages   Patient Active Problem List   Diagnosis Date Noted   Postmenopausal bleeding 05/07/2022   Fear of other medical care 05/06/2022   Acute cystitis without hematuria 03/06/2022   Vaginal yeast infection 03/06/2022   Primary osteoarthritis of both knees 03/06/2022   Bilateral ovarian cysts 03/06/2022   Septic shock 02/25/2022   Major depressive disorder, recurrent episode, severe with anxious distress 08/09/2021   Attention deficit hyperactivity disorder (ADHD), predominantly inattentive type 08/09/2021   Need for varicella vaccine 08/09/2021   Urge incontinence 08/01/2021   Tinea corporis 03/14/2021   Hyperlipidemia LDL goal <100 03/01/2021   DDD (degenerative disc disease), lumbar 08/08/2020   Non-seasonal allergic rhinitis due to pollen 07/25/2020   Chronic idiopathic constipation 07/25/2020   Visit for screening mammogram 02/11/2020   Chronic bilateral low back pain without sciatica 02/10/2020   Spondylosis without myelopathy or radiculopathy, lumbar region 12/04/2017   Type 2 diabetes mellitus with hyperglycemia    Normal coronary arteries  04/17/2016   OSA on CPAP 10/28/2014   Major depressive disorder 10/28/2014   Chronic systolic CHF (congestive heart failure), NYHA class 3 12/06/2013   Hyperlipidemia associated with type 2 diabetes mellitus (HCC), on Zocor 04/04/2011   Abnormal electrocardiography 01/16/2009   Hypertension associated with diabetes 06/03/2008   Morbid obesity with BMI of 50.0-59.9, adult 07/12/2006   NICM (nonischemic cardiomyopathy)  07/12/2006   Attention deficit disorder 07/12/2006   Migraine without status migrainosus, not intractable 07/12/2006    PCP: [name], MD   REFERRING PROVIDER: Rodolph Bong, MD   REFERRING DIAG:   Chronic pain of right knee  M54.50,G89.29 (ICD-10-CM) - Chronic bilateral low back pain without sciatica    Rationale for Evaluation and Treatment: Rehabilitation  THERAPY DIAG:  Other low back pain  Chronic pain of right knee  Muscle weakness (generalized)  ONSET DATE: >5  SUBJECTIVE:                                                                                                                                                                                           SUBJECTIVE STATEMENT: Pt reports she is both anxious and excited about first aquatic session.   PERTINENT HISTORY:  [name] 03/19/22: 57 y.o. female with chronic low back pain and chronic right knee pain  . . . bothersome paresthesias sensation bilateral lower extremities.  . . . Right knee pain due to exacerbation of DJD.  . . . Chronic back pain thought to be due to core weakness and facet arthritis changes seen on CT scan.   PAIN:  Are you having pain? Yes: NPRS scale:8 /10 Pain location: LB to right side, Rt knee  Pain description: ache, pressure Aggravating factors: standing and moving, walking through home Relieving factors: sitting  PRECAUTIONS: Knee and Back  WEIGHT BEARING RESTRICTIONS: No  FALLS:  Has patient fallen in last 6 months? No  LIVING ENVIRONMENT: Lives with: lives with their family and lives with their spouse Lives in: House/apartment Stairs: Yes: Internal: 16 steps; on right going up Has following equipment at home: Walker - 4 wheeled and shower chair  OCCUPATION: disabled  PLOF: Needs assistance with ADLs, Needs assistance with homemaking, and Needs assistance with gait  PATIENT GOALS: increased movement without pain, ADL's, cook, climb stairs, go to the store  without hurting  NEXT MD VISIT: 6 weeks  OBJECTIVE:   DIAGNOSTIC FINDINGS:  X-ray images right knee: Mild medial compartment DJD.  Mild patellofemoral DJD  CT scan images of lumbar spine: Diffuse degenerative changes present throughout lumbar spine   PATIENT SURVEYS:  FOTO Primary score 22% with goal  39%  SCREENING FOR RED FLAGS: none  COGNITION: Overall cognitive status: Within functional limits for tasks assessed     SENSATION: wfl  POSTURE: increased thoracic kyphosis,right shoulder elevated  PALPATION: No tenderness although difficulty to assess due to body habitus  LUMBAR ROM:   AROM eval  Flexion Mid shin  Extension 0  Right lateral flexion 50%  Left lateral flexion 50%  Right rotation 100%  Left rotation 100%   (Blank rows = not tested)  LOWER EXTREMITY ROM:      Limited due to body habitus  LOWER EXTREMITY MMT:    MMT Right eval Left eval  Hip flexion 3+ 3+  Hip extension    Hip abduction    Hip adduction    Hip internal rotation    Hip external rotation    Knee flexion 4- 4-  Knee extension 4+ 4+  Ankle dorsiflexion    Ankle plantarflexion    Ankle inversion    Ankle eversion     (Blank rows = not tested)  LUMBAR SPECIAL TESTS:  Slump test: Negative  FUNCTIONAL TESTS:  5 times sit to stand: 24.16 seated on scooter Timed up and go (TUG): 20.90 from seated on scooter Berg: 35/56  GAIT: Distance walked: 30 ft Assistive device utilized: None Level of assistance:  none Comments: shoulder retraction, antalgic gait pattern  TODAY'S TREATMENT:                                                                                                                              Pt seen for aquatic therapy today.  Treatment took place in water 3.5-4 ft in depth at the [facility] pool. Temp of water was 91.  Pt entered/exited the pool via stairs independently with bilat rail.  * intro to aquatic therapy * holding yellow noodle:  walking  forward/ backwards / side stepping * sitting on bench:  2 rounds on cycling, long sitting hip abdct/ addct, alternating LAQ with DF * return to 3.75 ft: side stepping with arm addct, backwards/forward walking, marching  without support  * holding wall:  hip abdct/addct x 10; heel raises x 10; squats x 10 * TrA set with short blue noodle pull down x 10 * return to walking backward/forward unsupported   Pt requires the buoyancy and hydrostatic pressure of water for support, and to offload joints by unweighting joint load by at least 50 % in navel deep water and by at least 75-80% in chest to neck deep water.  Viscosity of the water is needed for resistance of strengthening. Water current perturbations provides challenge to standing balance requiring increased core activation.     PATIENT EDUCATION:  Education details: Aquatic therapy intro  Person educated: Patient Education method: Explanation Education comprehension: verbalized understanding  HOME EXERCISE PROGRAM: tba  ASSESSMENT:  CLINICAL IMPRESSION: Pt is confident in aquatic setting and able to take direction from therapist on land.  She reported reduction of pain to  1-2/10 during session.  She will benefit from skilled physical therapy beginning aquatic based which she will benefit from the properties of water to improve gait, strength and balance as well as toleration to activity then will transition land based for advancing of treatment.  Goals are ongoing.   OBJECTIVE IMPAIRMENTS: decreased activity tolerance, decreased balance, decreased endurance, decreased knowledge of use of DME, decreased mobility, difficulty walking, decreased strength, impaired sensation, postural dysfunction, obesity, and pain.   ACTIVITY LIMITATIONS: carrying, lifting, bending, sitting, standing, squatting, stairs, transfers, bed mobility, locomotion level, and caring for others  PARTICIPATION LIMITATIONS: meal prep, cleaning, laundry, shopping,  community activity, and occupation  PERSONAL FACTORS: Age, Behavior pattern, Fitness, Time since onset of injury/illness/exacerbation, and 1-2 comorbidities: cardiomyopathy, Oa knees and back, depression/anxiety  are also affecting patient's functional outcome.   REHAB POTENTIAL: Good  CLINICAL DECISION MAKING: Evolving/moderate complexity  EVALUATION COMPLEXITY: Moderate   GOALS: Goals reviewed with patient? Yes  SHORT TERM GOALS: Target date: 06/24/22  Pt will tolerate full aquatic sessions consistently without increase in pain and with improving function to demonstrate good toleration and effectiveness of intervention.   Baseline:TBA Goal status: INITIAL  2.  Pt will climb stairs using combination of step to and alternating pattern without pausing consistently Baseline: TBA Goal status: INITIAL  3.  Pt will report climbing stairs at home 1-2 x weekly to access bedroom and full bath more regularly. Baseline: <1x weekly Goal status: INITIAL  4.  Pt will tolerate walking 8-10 continuous minutes in pool to demonstrate improving toleration to activity Baseline: na Goal status: INITIAL  5.  Pt will improve on 5 X STS test to <or=   18s to demonstrate improving functional lower extremity strength, transitional movements, and balance Baseline: 24.16 Goal status: INITIAL  6.  Pt will improve on Tug test to <or= 14s to demonstrate improvement in lower extremity function, mobility and decreased fall risk.  Baseline:  20.90 Goal status: INITIAL  LONG TERM GOALS: Target date: 07/19/22  Pt to meet stated Foto Goal of 39% to demonstrate improved perception of ability Baseline: 22% Goal status: INITIAL  2.  Pt will be indep with final HEP's (land and aquatic as appropriate) for continued management of condition  Baseline:  Goal status: INITIAL  3.  Pt will walk in store for up to or >15 mins pushing cart Baseline: rides scooter Goal status: INITIAL  4.  Pt will report decrease  in pain with amb to <5/10 to demonstrate improvement management of condition Baseline: 9/10 Goal status: INITIAL  5.  Pt will improve on Berg balance test to >/= 50/56 to demonstrate a decrease in fall risk.  Baseline: 35/56 Goal status: INITIAL  6.  Pt will increase strength of hip by 1 full grade to improve functional mobility. Baseline: see chart Goal status: INITIAL  PLAN:  PT FREQUENCY: 1-2x/week  PT DURATION: 10 weeks skipping 1st 2 weeks to address medical issue  PLANNED INTERVENTIONS: Therapeutic exercises, Therapeutic activity, Neuromuscular re-education, Balance training, Gait training, Patient/Family education, Self Care, Joint mobilization, Stair training, DME instructions, Aquatic Therapy, Dry Needling, Cryotherapy, Moist heat, Taping, Ultrasound, Biofeedback, Ionotophoresis 4mg /ml Dexamethasone, Manual therapy, and Re-evaluation.  PLAN FOR NEXT SESSION: aquatics for general strengthening, gait and balance retraining, proprioceptive retraining, toleration to activity/aerobic capacity, and pain reduction  Mayer Camel, PTA 06/05/22 9:45 AM [facility] [address] [place], [place], [address] Phone: [phone]   Fax:  [phone]

## 2022-06-07 ENCOUNTER — Encounter (HOSPITAL_BASED_OUTPATIENT_CLINIC_OR_DEPARTMENT_OTHER): Payer: Self-pay | Admitting: Physical Therapy

## 2022-06-07 ENCOUNTER — Other Ambulatory Visit: Payer: Self-pay | Admitting: Cardiology

## 2022-06-07 ENCOUNTER — Other Ambulatory Visit: Payer: Self-pay | Admitting: Internal Medicine

## 2022-06-07 ENCOUNTER — Other Ambulatory Visit: Payer: Self-pay | Admitting: Physician Assistant

## 2022-06-07 ENCOUNTER — Ambulatory Visit (HOSPITAL_BASED_OUTPATIENT_CLINIC_OR_DEPARTMENT_OTHER): Payer: Medicare Other | Admitting: Physical Therapy

## 2022-06-07 DIAGNOSIS — M25561 Pain in right knee: Secondary | ICD-10-CM | POA: Diagnosis not present

## 2022-06-07 DIAGNOSIS — M5459 Other low back pain: Secondary | ICD-10-CM | POA: Diagnosis not present

## 2022-06-07 DIAGNOSIS — F332 Major depressive disorder, recurrent severe without psychotic features: Secondary | ICD-10-CM

## 2022-06-07 DIAGNOSIS — M6281 Muscle weakness (generalized): Secondary | ICD-10-CM

## 2022-06-07 DIAGNOSIS — G8929 Other chronic pain: Secondary | ICD-10-CM | POA: Diagnosis not present

## 2022-06-07 NOTE — Therapy (Signed)
OUTPATIENT PHYSICAL THERAPY THORACOLUMBAR TREATMENT   Patient Name: [name] MRN: [id number] DOB:[birth year], 57 y.o., female Today's Date: 06/07/2022  END OF SESSION:  PT End of Session - 06/07/22 1312     Visit Number 3    Number of Visits 18    Date for PT Re-Evaluation 07/19/22    Authorization Type MCR    PT Start Time 1300    PT Stop Time 1340    PT Time Calculation (min) 40 min    Activity Tolerance Patient tolerated treatment well    Behavior During Therapy WFL for tasks assessed/performed             Past Medical History:  Diagnosis Date   ADD (attention deficit disorder)    Anxiety    Arthritis    both knees right worse than left   Carpal tunnel syndrome of right wrist    Depression    Essential hypertension, benign    Gallstones    History of smoking 06/22/2016   Hyperlipidemia associated with type 2 diabetes mellitus (HCC), on Zocor 04/04/2011   Hypertension associated with diabetes 06/03/2008   Migraines    Morbid obesity    Morbid obesity with BMI of 50.0-59.9, adult 07/12/2006   NICM (nonischemic cardiomyopathy) 07/12/2006   01/16/18 ECHO:    - Procedure narrative: Transthoracic echocardiography. Image   quality was suboptimal. The study was technically difficult.   Intravenous contrast (Definity) was administered. - Left ventricle: The cavity size was moderately dilated. Wall   thickness was increased in a pattern of mild LVH. Systolic   function was moderately to severely reduced. The estimated   ejection fraction w   Normal coronary arteries 04/17/2016   OSA on CPAP 10/28/2014   Spinal stenosis    back pain   Spondylosis without myelopathy or radiculopathy, lumbar region 12/04/2017   Type 2 diabetes mellitus with hyperglycemia    Past Surgical History:  Procedure Laterality Date   CHOLECYSTECTOMY     DILATATION & CURETTAGE/HYSTEROSCOPY WITH MYOSURE N/A 10/31/2017   Procedure: DILATATION & CURETTAGE/HYSTEROSCOPY WITH MYOSURE;  Surgeon: Romualdo Bolk, [name];  Location: [facility];  Service: Gynecology;  Laterality: N/A;   HYSTEROSCOPY WITH D & C N/A 05/07/2022   Procedure: DILATATION AND CURETTAGE /HYSTEROSCOPY;  Surgeon: [name], [name];  Location: [facility];  Service: Gynecology;  Laterality: N/A;   RIGHT/LEFT HEART CATH AND CORONARY ANGIOGRAPHY N/A 04/11/2016   Procedure: Right/Left Heart Cath and Coronary Angiography;  Surgeon: Kathleene Hazel, [name];  Location: [facility];  Service: Cardiovascular;  Laterality: N/A;   sonogram for blood clots     no blockages   Patient Active Problem List   Diagnosis Date Noted   Postmenopausal bleeding 05/07/2022   Fear of other medical care 05/06/2022   Acute cystitis without hematuria 03/06/2022   Vaginal yeast infection 03/06/2022   Primary osteoarthritis of both knees 03/06/2022   Bilateral ovarian cysts 03/06/2022   Septic shock 02/25/2022   Major depressive disorder, recurrent episode, severe with anxious distress 08/09/2021   Attention deficit hyperactivity disorder (ADHD), predominantly inattentive type 08/09/2021   Need for varicella vaccine 08/09/2021   Urge incontinence 08/01/2021   Tinea corporis 03/14/2021   Hyperlipidemia LDL goal <100 03/01/2021   DDD (degenerative disc disease), lumbar 08/08/2020   Non-seasonal allergic rhinitis due to pollen 07/25/2020   Chronic idiopathic constipation 07/25/2020   Visit for screening mammogram 02/11/2020   Chronic bilateral low back pain without sciatica 02/10/2020   Spondylosis without myelopathy  or radiculopathy, lumbar region 12/04/2017   Type 2 diabetes mellitus with hyperglycemia    Normal coronary arteries 04/17/2016   OSA on CPAP 10/28/2014   Major depressive disorder 10/28/2014   Chronic systolic CHF (congestive heart failure), NYHA class 3 12/06/2013   Hyperlipidemia associated with type 2 diabetes mellitus (HCC), on Zocor 04/04/2011   Abnormal electrocardiography 01/16/2009   Hypertension associated with diabetes  06/03/2008   Morbid obesity with BMI of 50.0-59.9, adult 07/12/2006   NICM (nonischemic cardiomyopathy) 07/12/2006   Attention deficit disorder 07/12/2006   Migraine without status migrainosus, not intractable 07/12/2006    PCP: [name], [name]   REFERRING PROVIDER: Rodolph Bong, [name]   REFERRING DIAG:   Chronic pain of right knee  M54.50,G89.29 (ICD-10-CM) - Chronic bilateral low back pain without sciatica    Rationale for Evaluation and Treatment: Rehabilitation  THERAPY DIAG:  Other low back pain  Chronic pain of right knee  Muscle weakness (generalized)  ONSET DATE: >5  SUBJECTIVE:                                                                                                                                                                                           SUBJECTIVE STATEMENT: Pt reports she has had more pain since first session.  Was very sore the last 2 days.[name]    PERTINENT HISTORY:  [name] 03/19/22: 57 y.o. female with chronic low back pain and chronic right knee pain  . . . bothersome paresthesias sensation bilateral lower extremities.  . . . Right knee pain due to exacerbation of DJD.  . . . Chronic back pain thought to be due to core weakness and facet arthritis changes seen on CT scan.   PAIN:  Are you having pain? Yes: NPRS scale:"13!" /10 Pain location: LB to bilat hips Pain description: ache, pressure Aggravating factors: standing and moving, walking through home Relieving factors: sitting  PRECAUTIONS: Knee and Back  WEIGHT BEARING RESTRICTIONS: No  FALLS:  Has patient fallen in last 6 months? No  LIVING ENVIRONMENT: Lives with: lives with their family and lives with their spouse Lives in: House/apartment Stairs: Yes: Internal: 16 steps; on right going up Has following equipment at home: Walker - 4 wheeled and shower chair  OCCUPATION: disabled  PLOF: Needs assistance with ADLs, Needs assistance with homemaking, and Needs  assistance with gait  PATIENT GOALS: increased movement without pain, ADL's, cook, climb stairs, go to the store without hurting  NEXT [name] VISIT: 6 weeks  OBJECTIVE:   DIAGNOSTIC FINDINGS:  X-ray images right knee: Mild medial compartment DJD.  Mild patellofemoral DJD  CT  scan images of lumbar spine: Diffuse degenerative changes present throughout lumbar spine   PATIENT SURVEYS:  FOTO Primary score 22% with goal 39%  SCREENING FOR RED FLAGS: none  COGNITION: Overall cognitive status: Within functional limits for tasks assessed     SENSATION: wfl  POSTURE: increased thoracic kyphosis,right shoulder elevated  PALPATION: No tenderness although difficulty to assess due to body habitus  LUMBAR ROM:   AROM eval  Flexion Mid shin  Extension 0  Right lateral flexion 50%  Left lateral flexion 50%  Right rotation 100%  Left rotation 100%   (Blank rows = not tested)  LOWER EXTREMITY ROM:      Limited due to body habitus  LOWER EXTREMITY MMT:    MMT Right eval Left eval  Hip flexion 3+ 3+  Hip extension    Hip abduction    Hip adduction    Hip internal rotation    Hip external rotation    Knee flexion 4- 4-  Knee extension 4+ 4+  Ankle dorsiflexion    Ankle plantarflexion    Ankle inversion    Ankle eversion     (Blank rows = not tested)  LUMBAR SPECIAL TESTS:  Slump test: Negative  FUNCTIONAL TESTS:  5 times sit to stand: 24.16 seated on scooter Timed up and go (TUG): 20.90 from seated on scooter Berg: 35/56  GAIT: Distance walked: 30 ft Assistive device utilized: None Level of assistance:  none Comments: shoulder retraction, antalgic gait pattern  TODAY'S TREATMENT:                                                                                                                              Pt seen for aquatic therapy today.  Treatment took place in water 3.5-4 ft in depth at the [facility] pool. Temp of water was 91.  Pt entered/exited  the pool via stairs independently with bilat rail.  * without support:  walking forward/ backwards / side stepping * sitting on bench: 2 rounds of  alternating LAQ with DF;  cycling, long sitting hip abdct/ addct, ankle circles * seated on bench in water with blue step under feet - STS with forward reach x 5, with ab set * holding wall:  heel/toe raises x 10; LE outward circles x 5;  * high knee marching forward / backward; side stepping with arm addct, *TrA set with short blue noodle pull down x 10 * supported supine for decompression:  nekdoodle, yellow noodle under arms, solid noodle under LEs 2 min   Pt requires the buoyancy and hydrostatic pressure of water for support, and to offload joints by unweighting joint load by at least 50 % in navel deep water and by at least 75-80% in chest to neck deep water.  Viscosity of the water is needed for resistance of strengthening. Water current perturbations provides challenge to standing balance requiring increased core activation.     PATIENT EDUCATION:  Education details: Aquatic  therapy intro  Person educated: Patient Education method: Explanation Education comprehension: verbalized understanding  HOME EXERCISE PROGRAM: tba  ASSESSMENT:  CLINICAL IMPRESSION: Pt tolerated the exercises in water well today.  She reported reduction of pain to 3-4/10 at end of session.  Access to pool outside of therapy sessions is currently a barrier for progression to independent with aquatic HEP.  She will benefit from skilled physical therapy beginning aquatic based which she will benefit from the properties of water to improve gait, strength and balance as well as toleration to activity then will transition land based for advancing of treatment.  Goals are ongoing.   OBJECTIVE IMPAIRMENTS: decreased activity tolerance, decreased balance, decreased endurance, decreased knowledge of use of DME, decreased mobility, difficulty walking, decreased strength,  impaired sensation, postural dysfunction, obesity, and pain.   ACTIVITY LIMITATIONS: carrying, lifting, bending, sitting, standing, squatting, stairs, transfers, bed mobility, locomotion level, and caring for others  PARTICIPATION LIMITATIONS: meal prep, cleaning, laundry, shopping, community activity, and occupation  PERSONAL FACTORS: Age, Behavior pattern, Fitness, Time since onset of injury/illness/exacerbation, and 1-2 comorbidities: cardiomyopathy, Oa knees and back, depression/anxiety  are also affecting patient's functional outcome.   REHAB POTENTIAL: Good  CLINICAL DECISION MAKING: Evolving/moderate complexity  EVALUATION COMPLEXITY: Moderate   GOALS: Goals reviewed with patient? Yes  SHORT TERM GOALS: Target date: 06/24/22  Pt will tolerate full aquatic sessions consistently without increase in pain and with improving function to demonstrate good toleration and effectiveness of intervention.   Baseline:TBA Goal status: INITIAL  2.  Pt will climb stairs using combination of step to and alternating pattern without pausing consistently Baseline: TBA Goal status: INITIAL  3.  Pt will report climbing stairs at home 1-2 x weekly to access bedroom and full bath more regularly. Baseline: <1x weekly Goal status: INITIAL  4.  Pt will tolerate walking 8-10 continuous minutes in pool to demonstrate improving toleration to activity Baseline: na Goal status: INITIAL  5.  Pt will improve on 5 X STS test to <or=   18s to demonstrate improving functional lower extremity strength, transitional movements, and balance Baseline: 24.16 Goal status: INITIAL  6.  Pt will improve on Tug test to <or= 14s to demonstrate improvement in lower extremity function, mobility and decreased fall risk.  Baseline:  20.90 Goal status: INITIAL  LONG TERM GOALS: Target date: 07/19/22  Pt to meet stated Foto Goal of 39% to demonstrate improved perception of ability Baseline: 22% Goal status:  INITIAL  2.  Pt will be indep with final HEP's (land and aquatic as appropriate) for continued management of condition  Baseline:  Goal status: INITIAL  3.  Pt will walk in store for up to or >15 mins pushing cart Baseline: rides scooter Goal status: INITIAL  4.  Pt will report decrease in pain with amb to <5/10 to demonstrate improvement management of condition Baseline: 9/10 Goal status: INITIAL  5.  Pt will improve on Berg balance test to >/= 50/56 to demonstrate a decrease in fall risk.  Baseline: 35/56 Goal status: INITIAL  6.  Pt will increase strength of hip by 1 full grade to improve functional mobility. Baseline: see chart Goal status: INITIAL  PLAN:  PT FREQUENCY: 1-2x/week  PT DURATION: 10 weeks skipping 1st 2 weeks to address medical issue  PLANNED INTERVENTIONS: Therapeutic exercises, Therapeutic activity, Neuromuscular re-education, Balance training, Gait training, Patient/Family education, Self Care, Joint mobilization, Stair training, DME instructions, Aquatic Therapy, Dry Needling, Cryotherapy, Moist heat, Taping, Ultrasound, Biofeedback, Ionotophoresis 4mg /ml Dexamethasone, Manual therapy,  and Re-evaluation.  PLAN FOR NEXT SESSION: aquatics for general strengthening, gait and balance retraining, proprioceptive retraining, toleration to activity/aerobic capacity, and pain reduction  Mayer Camel, PTA 06/07/22 3:49 PM [facility] [address] [place], [place], [address] Phone: [phone]   Fax:  [phone]

## 2022-06-10 ENCOUNTER — Other Ambulatory Visit: Payer: Self-pay

## 2022-06-10 MED ORDER — ENTRESTO 97-103 MG PO TABS: 1.0000 | ORAL_TABLET | Freq: Two times a day (BID) | ORAL | 2 refills | Status: DC

## 2022-06-12 ENCOUNTER — Encounter (HOSPITAL_BASED_OUTPATIENT_CLINIC_OR_DEPARTMENT_OTHER): Payer: Self-pay | Admitting: Physical Therapy

## 2022-06-12 ENCOUNTER — Ambulatory Visit (HOSPITAL_BASED_OUTPATIENT_CLINIC_OR_DEPARTMENT_OTHER): Payer: Medicare Other | Admitting: Physical Therapy

## 2022-06-12 DIAGNOSIS — G8929 Other chronic pain: Secondary | ICD-10-CM | POA: Diagnosis not present

## 2022-06-12 DIAGNOSIS — M6281 Muscle weakness (generalized): Secondary | ICD-10-CM | POA: Diagnosis not present

## 2022-06-12 DIAGNOSIS — M25561 Pain in right knee: Secondary | ICD-10-CM | POA: Diagnosis not present

## 2022-06-12 DIAGNOSIS — M5459 Other low back pain: Secondary | ICD-10-CM

## 2022-06-12 NOTE — Therapy (Signed)
OUTPATIENT PHYSICAL THERAPY THORACOLUMBAR TREATMENT   Patient Name: [name] MRN: [id number] DOB:1965/03/13, 57 y.o., female Today's Date: 06/12/2022  END OF SESSION:  PT End of Session - 06/12/22 1047     Visit Number 4    Number of Visits 18    Date for PT Re-Evaluation 07/19/22    Authorization Type MCR    PT Start Time 1030    PT Stop Time 1110    PT Time Calculation (min) 40 min    Behavior During Therapy WFL for tasks assessed/performed             Past Medical History:  Diagnosis Date   ADD (attention deficit disorder)    Anxiety    Arthritis    both knees right worse than left   Carpal tunnel syndrome of right wrist    Depression    Essential hypertension, benign    Gallstones    History of smoking 06/22/2016   Hyperlipidemia associated with type 2 diabetes mellitus (HCC), on Zocor 04/04/2011   Hypertension associated with diabetes 06/03/2008   Migraines    Morbid obesity    Morbid obesity with BMI of 50.0-59.9, adult 07/12/2006   NICM (nonischemic cardiomyopathy) 07/12/2006   01/16/18 ECHO:    - Procedure narrative: Transthoracic echocardiography. Image   quality was suboptimal. The study was technically difficult.   Intravenous contrast (Definity) was administered. - Left ventricle: The cavity size was moderately dilated. Wall   thickness was increased in a pattern of mild LVH. Systolic   function was moderately to severely reduced. The estimated   ejection fraction w   Normal coronary arteries 04/17/2016   OSA on CPAP 10/28/2014   Spinal stenosis    back pain   Spondylosis without myelopathy or radiculopathy, lumbar region 12/04/2017   Type 2 diabetes mellitus with hyperglycemia    Past Surgical History:  Procedure Laterality Date   CHOLECYSTECTOMY     DILATATION & CURETTAGE/HYSTEROSCOPY WITH MYOSURE N/A 10/31/2017   Procedure: DILATATION & CURETTAGE/HYSTEROSCOPY WITH MYOSURE;  Surgeon: Romualdo Bolk, MD;  Location: [facility];  Service: Gynecology;   Laterality: N/A;   HYSTEROSCOPY WITH D & C N/A 05/07/2022   Procedure: DILATATION AND CURETTAGE /HYSTEROSCOPY;  Surgeon: [name], MD;  Location: [facility];  Service: Gynecology;  Laterality: N/A;   RIGHT/LEFT HEART CATH AND CORONARY ANGIOGRAPHY N/A 04/11/2016   Procedure: Right/Left Heart Cath and Coronary Angiography;  Surgeon: Kathleene Hazel, MD;  Location[facility] INVASIVE CV LAB;  Service: Cardiovascular;  Laterality: N/A;   sonogram for blood clots     no blockages   Patient Active Problem List   Diagnosis Date Noted   Postmenopausal bleeding 05/07/2022   Fear of other medical care 05/06/2022   Acute cystitis without hematuria 03/06/2022   Vaginal yeast infection 03/06/2022   Primary osteoarthritis of both knees 03/06/2022   Bilateral ovarian cysts 03/06/2022   Septic shock 02/25/2022   Major depressive disorder, recurrent episode, severe with anxious distress 08/09/2021   Attention deficit hyperactivity disorder (ADHD), predominantly inattentive type 08/09/2021   Need for varicella vaccine 08/09/2021   Urge incontinence 08/01/2021   Tinea corporis 03/14/2021   Hyperlipidemia LDL goal <100 03/01/2021   DDD (degenerative disc disease), lumbar 08/08/2020   Non-seasonal allergic rhinitis due to pollen 07/25/2020   Chronic idiopathic constipation 07/25/2020   Visit for screening mammogram 02/11/2020   Chronic bilateral low back pain without sciatica 02/10/2020   Spondylosis without myelopathy or radiculopathy, lumbar region 12/04/2017   Type 2  diabetes mellitus with hyperglycemia    Normal coronary arteries 04/17/2016   OSA on CPAP 10/28/2014   Major depressive disorder 10/28/2014   Chronic systolic CHF (congestive heart failure), NYHA class 3 12/06/2013   Hyperlipidemia associated with type 2 diabetes mellitus (HCC), on Zocor 04/04/2011   Abnormal electrocardiography 01/16/2009   Hypertension associated with diabetes 06/03/2008   Morbid obesity with BMI of 50.0-59.9,  adult 07/12/2006   NICM (nonischemic cardiomyopathy) 07/12/2006   Attention deficit disorder 07/12/2006   Migraine without status migrainosus, not intractable 07/12/2006    PCP: [name], MD   REFERRING PROVIDER: Rodolph Bong, MD   REFERRING DIAG:   Chronic pain of right knee  M54.50,G89.29 (ICD-10-CM) - Chronic bilateral low back pain without sciatica    Rationale for Evaluation and Treatment: Rehabilitation  THERAPY DIAG:  Other low back pain  Chronic pain of right knee  Muscle weakness (generalized)  ONSET DATE: >5  SUBJECTIVE:                                                                                                                                                                                           SUBJECTIVE STATEMENT: Pt reports her back has been very sore.  She said she took a nap after last session.    PERTINENT HISTORY:  [name] 03/19/22: 57 y.o. female with chronic low back pain and chronic right knee pain  . . . bothersome paresthesias sensation bilateral lower extremities.  . . . Right knee pain due to exacerbation of DJD.  . . . Chronic back pain thought to be due to core weakness and facet arthritis changes seen on CT scan.   PAIN:  Are you having pain? Yes: NPRS scale:"300!" /10 Pain location: LB to bilat hips Pain description: ache, pressure Aggravating factors: standing and moving, walking through home Relieving factors: sitting  PRECAUTIONS: Knee and Back  WEIGHT BEARING RESTRICTIONS: No  FALLS:  Has patient fallen in last 6 months? No  LIVING ENVIRONMENT: Lives with: lives with their family and lives with their spouse Lives in: House/apartment Stairs: Yes: Internal: 16 steps; on right going up Has following equipment at home: Walker - 4 wheeled and shower chair  OCCUPATION: disabled  PLOF: Needs assistance with ADLs, Needs assistance with homemaking, and Needs assistance with gait  PATIENT GOALS: increased  movement without pain, ADL's, cook, climb stairs, go to the store without hurting  NEXT MD VISIT: 6 weeks  OBJECTIVE:   DIAGNOSTIC FINDINGS:  X-ray images right knee: Mild medial compartment DJD.  Mild patellofemoral DJD  CT scan images of lumbar spine: Diffuse degenerative changes present  throughout lumbar spine   PATIENT SURVEYS:  FOTO Primary score 22% with goal 39%  SCREENING FOR RED FLAGS: none  COGNITION: Overall cognitive status: Within functional limits for tasks assessed     SENSATION: wfl  POSTURE: increased thoracic kyphosis,right shoulder elevated  PALPATION: No tenderness although difficulty to assess due to body habitus  LUMBAR ROM:   AROM eval  Flexion Mid shin  Extension 0  Right lateral flexion 50%  Left lateral flexion 50%  Right rotation 100%  Left rotation 100%   (Blank rows = not tested)  LOWER EXTREMITY ROM:      Limited due to body habitus  LOWER EXTREMITY MMT:    MMT Right eval Left eval  Hip flexion 3+ 3+  Hip extension    Hip abduction    Hip adduction    Hip internal rotation    Hip external rotation    Knee flexion 4- 4-  Knee extension 4+ 4+  Ankle dorsiflexion    Ankle plantarflexion    Ankle inversion    Ankle eversion     (Blank rows = not tested)  LUMBAR SPECIAL TESTS:  Slump test: Negative  FUNCTIONAL TESTS:  5 times sit to stand: 24.16 seated on scooter Timed up and go (TUG): 20.90 from seated on scooter Berg: 35/56  GAIT: Distance walked: 30 ft Assistive device utilized: None Level of assistance:  none Comments: shoulder retraction, antalgic gait pattern  TODAY'S TREATMENT:                                                                                                                              Pt seen for aquatic therapy today.  Treatment took place in water 3.5-4.75 ft in depth at the [facility] pool. Temp of water was 91.  Pt entered/exited the pool via stairs independently with bilat  rail.  * without support:  walking forward/ backwards - 3 laps / side stepping - 3 laps  * relaxed squat - 20 sec * straddling yellow noodle: cycling with breast stroke arms (forward / backward),  holding wall- scissor LEs and cc ski; return to cycling - 2 rounds  * return to walking forward /backward  *TrA set with short blue noodle pull down x 5 -> solid noodle x 10 * prone float with/without noodle * back float without support for decompression    Pt requires the buoyancy and hydrostatic pressure of water for support, and to offload joints by unweighting joint load by at least 50 % in navel deep water and by at least 75-80% in chest to neck deep water.  Viscosity of the water is needed for resistance of strengthening. Water current perturbations provides challenge to standing balance requiring increased core activation.     PATIENT EDUCATION:  Education details: Aquatic therapy progressions/ modifications  Person educated: Patient Education method: Explanation Education comprehension: verbalized understanding  HOME EXERCISE PROGRAM: tba  ASSESSMENT:  CLINICAL IMPRESSION: Pt tolerated the exercises in  water well today, despite arrival with elevated pain level.   She reported reduction of pain to 6-7/10 at end of session.  She did well with extended time in deeper water with buoyancy assist of noodle.  She will benefit from skilled physical therapy beginning aquatic based which she will benefit from the properties of water to improve gait, strength and balance as well as toleration to activity then will transition land based for advancing of treatment.  Goals are ongoing.   OBJECTIVE IMPAIRMENTS: decreased activity tolerance, decreased balance, decreased endurance, decreased knowledge of use of DME, decreased mobility, difficulty walking, decreased strength, impaired sensation, postural dysfunction, obesity, and pain.   ACTIVITY LIMITATIONS: carrying, lifting, bending, sitting,  standing, squatting, stairs, transfers, bed mobility, locomotion level, and caring for others  PARTICIPATION LIMITATIONS: meal prep, cleaning, laundry, shopping, community activity, and occupation  PERSONAL FACTORS: Age, Behavior pattern, Fitness, Time since onset of injury/illness/exacerbation, and 1-2 comorbidities: cardiomyopathy, Oa knees and back, depression/anxiety  are also affecting patient's functional outcome.   REHAB POTENTIAL: Good  CLINICAL DECISION MAKING: Evolving/moderate complexity  EVALUATION COMPLEXITY: Moderate   GOALS: Goals reviewed with patient? Yes  SHORT TERM GOALS: Target date: 06/24/22  Pt will tolerate full aquatic sessions consistently without increase in pain and with improving function to demonstrate good toleration and effectiveness of intervention.   Baseline:TBA Goal status:ONGOING   2.  Pt will climb stairs using combination of step to and alternating pattern without pausing consistently Baseline: TBA Goal status: INITIAL  3.  Pt will report climbing stairs at home 1-2 x weekly to access bedroom and full bath more regularly. Baseline: <1x weekly Goal status: INITIAL  4.  Pt will tolerate walking 8-10 continuous minutes in pool to demonstrate improving toleration to activity Baseline: na Goal status: INITIAL  5.  Pt will improve on 5 X STS test to <or=   18s to demonstrate improving functional lower extremity strength, transitional movements, and balance Baseline: 24.16 Goal status: INITIAL  6.  Pt will improve on Tug test to <or= 14s to demonstrate improvement in lower extremity function, mobility and decreased fall risk.  Baseline:  20.90 Goal status: INITIAL  LONG TERM GOALS: Target date: 07/19/22  Pt to meet stated Foto Goal of 39% to demonstrate improved perception of ability Baseline: 22% Goal status: INITIAL  2.  Pt will be indep with final HEP's (land and aquatic as appropriate) for continued management of condition  Baseline:   Goal status: INITIAL  3.  Pt will walk in store for up to or >15 mins pushing cart Baseline: rides scooter Goal status: INITIAL  4.  Pt will report decrease in pain with amb to <5/10 to demonstrate improvement management of condition Baseline: 9/10 Goal status: INITIAL  5.  Pt will improve on Berg balance test to >/= 50/56 to demonstrate a decrease in fall risk.  Baseline: 35/56 Goal status: INITIAL  6.  Pt will increase strength of hip by 1 full grade to improve functional mobility. Baseline: see chart Goal status: INITIAL  PLAN:  PT FREQUENCY: 1-2x/week  PT DURATION: 10 weeks skipping 1st 2 weeks to address medical issue  PLANNED INTERVENTIONS: Therapeutic exercises, Therapeutic activity, Neuromuscular re-education, Balance training, Gait training, Patient/Family education, Self Care, Joint mobilization, Stair training, DME instructions, Aquatic Therapy, Dry Needling, Cryotherapy, Moist heat, Taping, Ultrasound, Biofeedback, Ionotophoresis 4mg /ml Dexamethasone, Manual therapy, and Re-evaluation.  PLAN FOR NEXT SESSION: aquatics for general strengthening, gait and balance retraining, proprioceptive retraining, toleration to activity/aerobic capacity, and pain reduction Victorino Dike  Harlow Mares, PTA 06/12/22 1:08 PM [facility] [address] [place], [place], [address] Phone: [phone]   Fax:  [phone]

## 2022-06-14 ENCOUNTER — Ambulatory Visit (HOSPITAL_BASED_OUTPATIENT_CLINIC_OR_DEPARTMENT_OTHER): Payer: Medicare Other | Admitting: Physical Therapy

## 2022-06-14 ENCOUNTER — Encounter (HOSPITAL_BASED_OUTPATIENT_CLINIC_OR_DEPARTMENT_OTHER): Payer: Self-pay | Admitting: Physical Therapy

## 2022-06-14 ENCOUNTER — Ambulatory Visit
Admission: RE | Admit: 2022-06-14 | Discharge: 2022-06-14 | Disposition: A | Discharge status: Home / Self Care | Payer: Medicare Other | Source: Ambulatory Visit | Attending: Obstetrics and Gynecology | Admitting: Obstetrics and Gynecology

## 2022-06-14 DIAGNOSIS — M25561 Pain in right knee: Secondary | ICD-10-CM | POA: Diagnosis not present

## 2022-06-14 DIAGNOSIS — M6281 Muscle weakness (generalized): Secondary | ICD-10-CM | POA: Diagnosis not present

## 2022-06-14 DIAGNOSIS — G8929 Other chronic pain: Secondary | ICD-10-CM | POA: Diagnosis not present

## 2022-06-14 DIAGNOSIS — M5459 Other low back pain: Secondary | ICD-10-CM

## 2022-06-14 DIAGNOSIS — Z1231 Encounter for screening mammogram for malignant neoplasm of breast: Secondary | ICD-10-CM

## 2022-06-14 NOTE — Therapy (Signed)
OUTPATIENT PHYSICAL THERAPY THORACOLUMBAR TREATMENT   Patient Name: [name] MRN: [id number] DOB:01-01-1966, 57 y.o., female Today's Date: 06/14/2022  END OF SESSION:  PT End of Session - 06/14/22 1032     Visit Number 5    Number of Visits 18    Date for PT Re-Evaluation 07/19/22    Authorization Type MCR    PT Start Time 1025    PT Stop Time 1105    PT Time Calculation (min) 40 min    Activity Tolerance Patient tolerated treatment well    Behavior During Therapy WFL for tasks assessed/performed             Past Medical History:  Diagnosis Date   ADD (attention deficit disorder)    Anxiety    Arthritis    both knees right worse than left   Carpal tunnel syndrome of right wrist    Depression    Essential hypertension, benign    Gallstones    History of smoking 06/22/2016   Hyperlipidemia associated with type 2 diabetes mellitus (HCC), on Zocor 04/04/2011   Hypertension associated with diabetes 06/03/2008   Migraines    Morbid obesity    Morbid obesity with BMI of 50.0-59.9, adult 07/12/2006   NICM (nonischemic cardiomyopathy) 07/12/2006   01/16/18 ECHO:    - Procedure narrative: Transthoracic echocardiography. Image   quality was suboptimal. The study was technically difficult.   Intravenous contrast (Definity) was administered. - Left ventricle: The cavity size was moderately dilated. Wall   thickness was increased in a pattern of mild LVH. Systolic   function was moderately to severely reduced. The estimated   ejection fraction w   Normal coronary arteries 04/17/2016   OSA on CPAP 10/28/2014   Spinal stenosis    back pain   Spondylosis without myelopathy or radiculopathy, lumbar region 12/04/2017   Type 2 diabetes mellitus with hyperglycemia    Past Surgical History:  Procedure Laterality Date   CHOLECYSTECTOMY     DILATATION & CURETTAGE/HYSTEROSCOPY WITH MYOSURE N/A 10/31/2017   Procedure: DILATATION & CURETTAGE/HYSTEROSCOPY WITH MYOSURE;  Surgeon: Romualdo Bolk, MD;  Location: [facility];  Service: Gynecology;  Laterality: N/A;   HYSTEROSCOPY WITH D & C N/A 05/07/2022   Procedure: DILATATION AND CURETTAGE /HYSTEROSCOPY;  Surgeon: [name], MD;  [facility];  Service: Gynecology;  Laterality: N/A;   RIGHT/LEFT HEART CATH AND CORONARY ANGIOGRAPHY N/A 04/11/2016   Procedure: Right/Left Heart Cath and Coronary Angiography;  Surgeon: Kathleene Hazel, MD;  Location: [facility] CV LAB;  Service: Cardiovascular;  Laterality: N/A;   sonogram for blood clots     no blockages   Patient Active Problem List   Diagnosis Date Noted   Postmenopausal bleeding 05/07/2022   Fear of other medical care 05/06/2022   Acute cystitis without hematuria 03/06/2022   Vaginal yeast infection 03/06/2022   Primary osteoarthritis of both knees 03/06/2022   Bilateral ovarian cysts 03/06/2022   Septic shock 02/25/2022   Major depressive disorder, recurrent episode, severe with anxious distress 08/09/2021   Attention deficit hyperactivity disorder (ADHD), predominantly inattentive type 08/09/2021   Need for varicella vaccine 08/09/2021   Urge incontinence 08/01/2021   Tinea corporis 03/14/2021   Hyperlipidemia LDL goal <100 03/01/2021   DDD (degenerative disc disease), lumbar 08/08/2020   Non-seasonal allergic rhinitis due to pollen 07/25/2020   Chronic idiopathic constipation 07/25/2020   Visit for screening mammogram 02/11/2020   Chronic bilateral low back pain without sciatica 02/10/2020   Spondylosis without myelopathy  or radiculopathy, lumbar region 12/04/2017   Type 2 diabetes mellitus with hyperglycemia    Normal coronary arteries 04/17/2016   OSA on CPAP 10/28/2014   Major depressive disorder 10/28/2014   Chronic systolic CHF (congestive heart failure), NYHA class 3 12/06/2013   Hyperlipidemia associated with type 2 diabetes mellitus (HCC), on Zocor 04/04/2011   Abnormal electrocardiography 01/16/2009   Hypertension associated with diabetes  06/03/2008   Morbid obesity with BMI of 50.0-59.9, adult 07/12/2006   NICM (nonischemic cardiomyopathy) 07/12/2006   Attention deficit disorder 07/12/2006   Migraine without status migrainosus, not intractable 07/12/2006    PCP: [name], MD   REFERRING PROVIDER: Rodolph Bong, MD   REFERRING DIAG:   Chronic pain of right knee  M54.50,G89.29 (ICD-10-CM) - Chronic bilateral low back pain without sciatica    Rationale for Evaluation and Treatment: Rehabilitation  THERAPY DIAG:  Other low back pain  Chronic pain of right knee  Muscle weakness (generalized)  ONSET DATE: >5  SUBJECTIVE:                                                                                                                                                                                           SUBJECTIVE STATEMENT: Pt reports her back continues to be painful.   She is looking into possibly changing insurance so that she can have pool membership options.   PERTINENT HISTORY:  [name] 03/19/22: 57 y.o. female with chronic low back pain and chronic right knee pain  . . . bothersome paresthesias sensation bilateral lower extremities.  . . . Right knee pain due to exacerbation of DJD.  . . . Chronic back pain thought to be due to core weakness and facet arthritis changes seen on CT scan.   PAIN:  Are you having pain? Yes: NPRS scale:10/10 Pain location: LB to bilat hips Pain description: ache, pressure Aggravating factors: standing and moving, walking through home Relieving factors: sitting  PRECAUTIONS: Knee and Back  WEIGHT BEARING RESTRICTIONS: No  FALLS:  Has patient fallen in last 6 months? No  LIVING ENVIRONMENT: Lives with: lives with their family and lives with their spouse Lives in: House/apartment Stairs: Yes: Internal: 16 steps; on right going up Has following equipment at home: Walker - 4 wheeled and shower chair  OCCUPATION: disabled  PLOF: Needs assistance with  ADLs, Needs assistance with homemaking, and Needs assistance with gait  PATIENT GOALS: increased movement without pain, ADL's, cook, climb stairs, go to the store without hurting  NEXT MD VISIT: 6 weeks  OBJECTIVE:   DIAGNOSTIC FINDINGS:  X-ray images right knee: Mild medial compartment DJD.  Mild patellofemoral DJD  CT scan images of lumbar spine: Diffuse degenerative changes present throughout lumbar spine   PATIENT SURVEYS:  FOTO Primary score 22% with goal 39%  SCREENING FOR RED FLAGS: none  COGNITION: Overall cognitive status: Within functional limits for tasks assessed     SENSATION: wfl  POSTURE: increased thoracic kyphosis,right shoulder elevated  PALPATION: No tenderness although difficulty to assess due to body habitus  LUMBAR ROM:   AROM eval  Flexion Mid shin  Extension 0  Right lateral flexion 50%  Left lateral flexion 50%  Right rotation 100%  Left rotation 100%   (Blank rows = not tested)  LOWER EXTREMITY ROM:      Limited due to body habitus  LOWER EXTREMITY MMT:    MMT Right eval Left eval  Hip flexion 3+ 3+  Hip extension    Hip abduction    Hip adduction    Hip internal rotation    Hip external rotation    Knee flexion 4- 4-  Knee extension 4+ 4+  Ankle dorsiflexion    Ankle plantarflexion    Ankle inversion    Ankle eversion     (Blank rows = not tested)  LUMBAR SPECIAL TESTS:  Slump test: Negative  FUNCTIONAL TESTS:  5 times sit to stand: 24.16 seated on scooter Timed up and go (TUG): 20.90 from seated on scooter Berg: 35/56  GAIT: Distance walked: 30 ft Assistive device utilized: None Level of assistance:  none Comments: shoulder retraction, antalgic gait pattern  TODAY'S TREATMENT:                                                                                                                              Pt seen for aquatic therapy today.  Treatment took place in water 3.5-4.75 ft in depth at the [employer] [facility] pool. Temp of water was 91.  Pt entered/exited the pool via stairs independently with bilat rail.  * without support:  walking forward/ backwards - 3 laps  * side stepping with shoulder addct with rainbow hand floats x 2 laps  * straddling yellow noodle: cycling with breast stroke arms (forward / backward) scissor LEs and cc ski; return to cycling - *  * STS at bench in water with feet on blue step with cues for ab set, forward arm reach, neutral head position x 8 * plank with hands on bench - alternating hip ext x 10 *TrA set with alternating + bilat rainbow hand float  pull downs x 10 * return to walking forward /backward  * holding wall: single leg clams x 10 each  * back float without support for decompression    Pt requires the buoyancy and hydrostatic pressure of water for support, and to offload joints by unweighting joint load by at least 50 % in navel deep water and by at least 75-80% in chest to neck deep water.  Viscosity of the water is needed for resistance of  strengthening. Water current perturbations provides challenge to standing balance requiring increased core activation.     PATIENT EDUCATION:  Education details: Aquatic therapy progressions/ modifications  Person educated: Patient Education method: Explanation Education comprehension: verbalized understanding  HOME EXERCISE PROGRAM: tba  ASSESSMENT:  CLINICAL IMPRESSION: Pt continues to report significant pain relief when exercising in the water.  She had difficulty tolerating STS at bench, but tolerance improved when following cues for neutral spine and TrA set.  Pt given encouragement to allow for gradual improvement in standing and movement tolerance on land.   She will benefit from skilled physical therapy beginning aquatic based which she will benefit from the properties of water to improve gait, strength and balance as well as toleration to activity then will transition land based for advancing of  treatment.  Pt has met LTG 1 and 4.     OBJECTIVE IMPAIRMENTS: decreased activity tolerance, decreased balance, decreased endurance, decreased knowledge of use of DME, decreased mobility, difficulty walking, decreased strength, impaired sensation, postural dysfunction, obesity, and pain.   ACTIVITY LIMITATIONS: carrying, lifting, bending, sitting, standing, squatting, stairs, transfers, bed mobility, locomotion level, and caring for others  PARTICIPATION LIMITATIONS: meal prep, cleaning, laundry, shopping, community activity, and occupation  PERSONAL FACTORS: Age, Behavior pattern, Fitness, Time since onset of injury/illness/exacerbation, and 1-2 comorbidities: cardiomyopathy, Oa knees and back, depression/anxiety  are also affecting patient's functional outcome.   REHAB POTENTIAL: Good  CLINICAL DECISION MAKING: Evolving/moderate complexity  EVALUATION COMPLEXITY: Moderate   GOALS: Goals reviewed with patient? Yes  SHORT TERM GOALS: Target date: 06/24/22  Pt will tolerate full aquatic sessions consistently without increase in pain and with improving function to demonstrate good toleration and effectiveness of intervention.   Baseline:TBA Goal status:MET - 06/14/22  2.  Pt will climb stairs using combination of step to and alternating pattern without pausing consistently Baseline: TBA Goal status: INITIAL  3.  Pt will report climbing stairs at home 1-2 x weekly to access bedroom and full bath more regularly. Baseline: <1x weekly Goal status: INITIAL  4.  Pt will tolerate walking 8-10 continuous minutes in pool to demonstrate improving toleration to activity Baseline: na Goal status: MET - 06/14/22  5.  Pt will improve on 5 X STS test to <or=   18s to demonstrate improving functional lower extremity strength, transitional movements, and balance Baseline: 24.16 Goal status: INITIAL  6.  Pt will improve on Tug test to <or= 14s to demonstrate improvement in lower extremity  function, mobility and decreased fall risk.  Baseline:  20.90 Goal status: INITIAL  LONG TERM GOALS: Target date: 07/19/22  Pt to meet stated Foto Goal of 39% to demonstrate improved perception of ability Baseline: 22% Goal status: INITIAL  2.  Pt will be indep with final HEP's (land and aquatic as appropriate) for continued management of condition  Baseline:  Goal status: INITIAL  3.  Pt will walk in store for up to or >15 mins pushing cart Baseline: rides scooter Goal status: INITIAL  4.  Pt will report decrease in pain with amb to <5/10 to demonstrate improvement management of condition Baseline: 9/10 Goal status: INITIAL  5.  Pt will improve on Berg balance test to >/= 50/56 to demonstrate a decrease in fall risk.  Baseline: 35/56 Goal status: INITIAL  6.  Pt will increase strength of hip by 1 full grade to improve functional mobility. Baseline: see chart Goal status: INITIAL  PLAN:  PT FREQUENCY: 1-2x/week  PT DURATION: 10 weeks skipping 1st 2  weeks to address medical issue  PLANNED INTERVENTIONS: Therapeutic exercises, Therapeutic activity, Neuromuscular re-education, Balance training, Gait training, Patient/Family education, Self Care, Joint mobilization, Stair training, DME instructions, Aquatic Therapy, Dry Needling, Cryotherapy, Moist heat, Taping, Ultrasound, Biofeedback, Ionotophoresis /ml Dexamethasone, Manual therapy, and Re-evaluation.  PLAN FOR NEXT SESSION: aquatics for general strengthening, gait and balance retraining, proprioceptive retraining, toleration to activity/aerobic capacity, and pain reduction  Mayer Camel, PTA 06/14/22 11:27 AM [facility] [address] [place], [place], [address] Phone: [phone]   Fax:  [phone]

## 2022-06-17 ENCOUNTER — Ambulatory Visit: Payer: Self-pay | Admitting: Licensed Clinical Social Worker

## 2022-06-17 NOTE — Patient Outreach (Signed)
  Care Coordination  Follow Up Visit Note   06/17/2022 Name: [name] MRN: [id number] DOB: 1965/07/26  [name] is a 57 y.o. year old female who sees [name], MD for primary care. I spoke with  [name] by phone today.  What matters to the patients health and wellness today?  Getting a mental health provider    Goals Addressed             This Visit's Progress    Care Coordination Activities for mental health / physical health support       Activities in order to accomplish goals.   I am glad we were able to get your appointment scheduled with [facility] [phone]   June 4th at 2:00 with,   Otelia Sergeant    And therapist Genia Del June 12th at 1:00 Continue with compliance of taking medication prescribed by Doctor and keep all medical appointments Keep medical appointment we reviewed today for physical therapy, You wanted to find out more information about your Medicaid ( per [facility] we will need to talk to your local DSS worker [name]) I am glad you were able to speak to [name] at the [employer]  [phone] for Medicare options( I understand this was overwhelming to you so we can revisit this at a later date               SDOH assessments and interventions completed:  No   Care Coordination Interventions:  Yes, provided  Interventions Today    Flowsheet Row Most Recent Value  Chronic Disease   Chronic disease during today's visit Hypertension (HTN), Congestive Heart Failure (CHF), Diabetes  General Interventions   General Interventions Discussed/Reviewed Communication with  Communication with --  [( [facility], [facility] , [name],  [place] scheduling appointment]  Exercise Interventions   Exercise Discussed/Reviewed Exercise Reviewed  [enjoying PT in the pool]  Education Interventions   Education Provided Provided Education  Provided Verbal Education On When to see  the doctor  [related to concerns with bleeding]  Mental Health Interventions   Mental Health Discussed/Reviewed Mental Health Reviewed, Depression, Coping Strategies  Safety Interventions   Safety Discussed/Reviewed Safety Reviewed       Follow up plan: Follow up call scheduled for 07/08/22    Encounter Outcome:  Pt. Visit Completed   [name], LCSW Social Work Care Coordination  [facility] [employer] [phone]

## 2022-06-17 NOTE — Patient Instructions (Signed)
Visit Information  Thank you for taking time to visit with me today. Please don't hesitate to contact me if I can be of assistance to you.   Following are the goals we discussed today:   Goals Addressed             This Visit's Progress    Care Coordination Activities for mental health / physical health support       Activities in order to accomplish goals.   I am glad we were able to get your appointment scheduled with [facility] [phone]   June 4th at 2:00 with,   Otelia Sergeant    And therapist Genia Del June 12th at 1:00 Continue with compliance of taking medication prescribed by Doctor and keep all medical appointments Keep medical appointment we reviewed today for physical therapy, You wanted to find out more information about your Medicaid ( per [facility] we will need to talk to your local DSS worker [name]) I am glad you were able to speak to [name] at the [employer]  [phone] for Medicare options( I understand this was overwhelming to you so we can revisit this at a later date                Our next appointment is by telephone on 07/08/22 at 2:00  Please call the care guide team at [phone] if you need to cancel or reschedule your appointment.   If you are experiencing a Mental Health or Behavioral Health Crisis or need someone to talk to, please call the Suicide and Crisis Lifeline: 988 call the [place] National Suicide Prevention Lifeline: 419-783-7847 or TTY: 380 069 5557 TTY (930)580-2679) to talk to a trained counselor call 1-800-273-TALK (toll free, 24 hour hotline) go to [facility] [address] [place] [phone])   The patient verbalized understanding of instructions, educational materials, and care plan provided today and DECLINED offer to receive copy of patient instructions, educational materials, and care plan.   [name], LCSW Social Work Care Coordination   [facility] [employer] [phone]

## 2022-06-19 ENCOUNTER — Other Ambulatory Visit: Payer: Self-pay

## 2022-06-19 ENCOUNTER — Ambulatory Visit (HOSPITAL_BASED_OUTPATIENT_CLINIC_OR_DEPARTMENT_OTHER): Payer: Medicare Other | Admitting: Physical Therapy

## 2022-06-19 ENCOUNTER — Encounter: Payer: Self-pay | Admitting: Obstetrics and Gynecology

## 2022-06-19 ENCOUNTER — Ambulatory Visit (INDEPENDENT_AMBULATORY_CARE_PROVIDER_SITE_OTHER): Payer: Medicare Other | Admitting: Obstetrics and Gynecology

## 2022-06-19 ENCOUNTER — Encounter (HOSPITAL_BASED_OUTPATIENT_CLINIC_OR_DEPARTMENT_OTHER): Payer: Self-pay | Admitting: Physical Therapy

## 2022-06-19 VITALS — BP 114/74 | HR 87 | Wt 307.2 lb

## 2022-06-19 DIAGNOSIS — M25561 Pain in right knee: Secondary | ICD-10-CM | POA: Diagnosis not present

## 2022-06-19 DIAGNOSIS — G8929 Other chronic pain: Secondary | ICD-10-CM | POA: Diagnosis not present

## 2022-06-19 DIAGNOSIS — M5459 Other low back pain: Secondary | ICD-10-CM | POA: Diagnosis not present

## 2022-06-19 DIAGNOSIS — M6281 Muscle weakness (generalized): Secondary | ICD-10-CM | POA: Diagnosis not present

## 2022-06-19 DIAGNOSIS — N914 Secondary oligomenorrhea: Secondary | ICD-10-CM | POA: Diagnosis not present

## 2022-06-19 NOTE — Progress Notes (Signed)
GYNECOLOGY VISIT  Patient name: [name] MRN [id number]  Date of birth: Sep 26, 1965 Chief Complaint:   Post-op Follow-up   History:  [name] is a 57 y.o. G2P1011 being seen today for AUB followup.   Had cramping and pain that started as dark blood and then had more bleeding, has lightened in the interim    Last week had some light bleeding, bright red then brownish. On April 22 had significant cramping and then noticed bleeding when she went to the bathroom. Had a day of bleeding followed by light bleeding/spotting up until today.   Past Medical History:  Diagnosis Date   ADD (attention deficit disorder)    Anxiety    Arthritis    both knees right worse than left   Carpal tunnel syndrome of right wrist    Depression    Essential hypertension, benign    Gallstones    History of smoking 06/22/2016   Hyperlipidemia associated with type 2 diabetes mellitus (HCC), on Zocor 04/04/2011   Hypertension associated with diabetes 06/03/2008   Migraines    Morbid obesity    Morbid obesity with BMI of 50.0-59.9, adult 07/12/2006   NICM (nonischemic cardiomyopathy) 07/12/2006   01/16/18 ECHO:    - Procedure narrative: Transthoracic echocardiography. Image   quality was suboptimal. The study was technically difficult.   Intravenous contrast (Definity) was administered. - Left ventricle: The cavity size was moderately dilated. Wall   thickness was increased in a pattern of mild LVH. Systolic   function was moderately to severely reduced. The estimated   ejection fraction w   Normal coronary arteries 04/17/2016   OSA on CPAP 10/28/2014   Spinal stenosis    back pain   Spondylosis without myelopathy or radiculopathy, lumbar region 12/04/2017   Type 2 diabetes mellitus with hyperglycemia     Past Surgical History:  Procedure Laterality Date   CHOLECYSTECTOMY     DILATATION & CURETTAGE/HYSTEROSCOPY WITH MYOSURE N/A 10/31/2017   Procedure: DILATATION & CURETTAGE/HYSTEROSCOPY WITH MYOSURE;   Surgeon: Romualdo Bolk, MD;  Location: [facility];  Service: Gynecology;  Laterality: N/A;   HYSTEROSCOPY WITH D & C N/A 05/07/2022   Procedure: DILATATION AND CURETTAGE /HYSTEROSCOPY;  Surgeon: [name], MD;  Location: [facility];  Service: Gynecology;  Laterality: N/A;   RIGHT/LEFT HEART CATH AND CORONARY ANGIOGRAPHY N/A 04/11/2016   Procedure: Right/Left Heart Cath and Coronary Angiography;  Surgeon: Kathleene Hazel, MD;  Location: [facility] INVASIVE CV LAB;  Service: Cardiovascular;  Laterality: N/A;   sonogram for blood clots     no blockages    The following portions of the patient's history were reviewed and updated as appropriate: allergies, current medications, past family history, past medical history, past social history, past surgical history and problem list.   Health Maintenance:   Last pap     Component Value Date/Time   DIAGPAP  08/01/2021 1656    - Negative for intraepithelial lesion or malignancy (NILM)   DIAGPAP  09/09/2017 0000    NEGATIVE FOR INTRAEPITHELIAL LESIONS OR MALIGNANCY.   HPVHIGH Negative 08/01/2021 1656   ADEQPAP  08/01/2021 1656    Satisfactory for evaluation; transformation zone component ABSENT.   ADEQPAP  09/09/2017 0000    Satisfactory for evaluation  endocervical/transformation zone component PRESENT.      Review of Systems:  Pertinent items are noted in HPI. Comprehensive review of systems was otherwise negative.   Objective:  Physical Exam BP 114/74   Pulse 87   Wt (!)  307 lb 3.2 oz (139.3 kg)   BMI 52.73 kg/m    Physical Exam Vitals reviewed.  HENT:     Head: Normocephalic and atraumatic.  Pulmonary:     Effort: Pulmonary effort is normal.  Neurological:     General: No focal deficit present.     Mental Status: She is alert.  Psychiatric:        Mood and Affect: Mood is anxious.    Labs and Imaging US PELVIC COMPLETE WITH TRANSVAGINAL  Result Date: 05/22/2022 CLINICAL DATA:  Abnormal uterine bleeding EXAM:  TRANSABDOMINAL AND TRANSVAGINAL ULTRASOUND OF PELVIS TECHNIQUE: Both transabdominal and transvaginal ultrasound examinations of the pelvis were performed. Transabdominal technique was performed for global imaging of the pelvis including uterus, ovaries, adnexal regions, and pelvic cul-de-sac. It was necessary to proceed with endovaginal exam following the transabdominal exam to visualize the endometrium and adnexal structures. COMPARISON:  Pelvic ultrasound 03/26/2022 FINDINGS: Uterus Measurements: 11.4 x 7.8 x 6.6 cm = volume: 309.2 mL. There is a 3.6 x 2.8 x 2.8 cm intramural fibroid posterior left uterine fundus. There is a 2.7 x 2.4 x 2.9 cm intramural fibroid posterior left uterine body. Endometrium Thickness: 8 mm.  No focal abnormality visualized. Right ovary Not visualized or assessed. Left ovary Not visualized or assessed. Other findings No abnormal free fluid. IMPRESSION: 1. Endometrium measures 8 mm. In the setting of post-menopausal bleeding, endometrial sampling is indicated to exclude carcinoma. If results are benign, sonohysterogram should be considered for focal lesion work-up. (Ref: Radiological Reasoning: Algorithmic Workup of Abnormal Vaginal Bleeding with Endovaginal Sonography and Sonohysterography. AJR 2008; 409:W11-91) 2. Uterine fibroids. 3. The ovaries are not visualized or assessed. Recommend short-term follow-up pelvic ultrasound to reassess the previously described right ovarian/right adnexal mass as this is not able to be visualized or evaluated on today's exam. Electronically Signed   By: Annia Belt M.D.   On: 05/22/2022 09:36       Assessment & Plan:   1. Secondary oligomenorrhea Labs today to assess if truly postmenopausal, pathology demonstrated benign endometrium but not atrophic. Discussed continuation of megace therapy to control bleeding and minimize pain with bleeding. May need to cycle progestin therapy until able to achieve amenorrhea. Will have follow up for second  opinion regarding bleeding management. - Estradiol - FSH    [name], MD Minimally Invasive Gynecologic Surgery Center for [facility], [facility]

## 2022-06-19 NOTE — Therapy (Signed)
OUTPATIENT PHYSICAL THERAPY THORACOLUMBAR TREATMENT   Patient Name: [name] MRN: [id number] DOB:06/24/1965, 57 y.o., female Today's Date: 06/19/2022  END OF SESSION:  PT End of Session - 06/19/22 1126     Visit Number 6    Number of Visits 18    Date for PT Re-Evaluation 07/19/22    Authorization Type MCR    PT Start Time 1116    PT Stop Time 1158    PT Time Calculation (min) 42 min    Activity Tolerance Patient tolerated treatment well    Behavior During Therapy WFL for tasks assessed/performed             Past Medical History:  Diagnosis Date   ADD (attention deficit disorder)    Anxiety    Arthritis    both knees right worse than left   Carpal tunnel syndrome of right wrist    Depression    Essential hypertension, benign    Gallstones    History of smoking 06/22/2016   Hyperlipidemia associated with type 2 diabetes mellitus (HCC), on Zocor 04/04/2011   Hypertension associated with diabetes 06/03/2008   Migraines    Morbid obesity    Morbid obesity with BMI of 50.0-59.9, adult 07/12/2006   NICM (nonischemic cardiomyopathy) 07/12/2006   01/16/18 ECHO:    - Procedure narrative: Transthoracic echocardiography. Image   quality was suboptimal. The study was technically difficult.   Intravenous contrast (Definity) was administered. - Left ventricle: The cavity size was moderately dilated. Wall   thickness was increased in a pattern of mild LVH. Systolic   function was moderately to severely reduced. The estimated   ejection fraction w   Normal coronary arteries 04/17/2016   OSA on CPAP 10/28/2014   Spinal stenosis    back pain   Spondylosis without myelopathy or radiculopathy, lumbar region 12/04/2017   Type 2 diabetes mellitus with hyperglycemia    Past Surgical History:  Procedure Laterality Date   CHOLECYSTECTOMY     DILATATION & CURETTAGE/HYSTEROSCOPY WITH MYOSURE N/A 10/31/2017   Procedure: DILATATION & CURETTAGE/HYSTEROSCOPY WITH MYOSURE;  Surgeon: Romualdo Bolk, MD;  Location: [facility];  Service: Gynecology;  Laterality: N/A;   HYSTEROSCOPY WITH D & C N/A 05/07/2022   Procedure: DILATATION AND CURETTAGE /HYSTEROSCOPY;  Surgeon: [name], MD;  Location: [facility];  Service: Gynecology;  Laterality: N/A;   RIGHT/LEFT HEART CATH AND CORONARY ANGIOGRAPHY N/A 04/11/2016   Procedure: Right/Left Heart Cath and Coronary Angiography;  Surgeon: Kathleene Hazel, MD;  Location: [facility];  Service: Cardiovascular;  Laterality: N/A;   sonogram for blood clots     no blockages   Patient Active Problem List   Diagnosis Date Noted   Postmenopausal bleeding 05/07/2022   Fear of other medical care 05/06/2022   Acute cystitis without hematuria 03/06/2022   Vaginal yeast infection 03/06/2022   Primary osteoarthritis of both knees 03/06/2022   Bilateral ovarian cysts 03/06/2022   Septic shock 02/25/2022   Major depressive disorder, recurrent episode, severe with anxious distress 08/09/2021   Attention deficit hyperactivity disorder (ADHD), predominantly inattentive type 08/09/2021   Need for varicella vaccine 08/09/2021   Urge incontinence 08/01/2021   Tinea corporis 03/14/2021   Hyperlipidemia LDL goal <100 03/01/2021   DDD (degenerative disc disease), lumbar 08/08/2020   Non-seasonal allergic rhinitis due to pollen 07/25/2020   Chronic idiopathic constipation 07/25/2020   Visit for screening mammogram 02/11/2020   Chronic bilateral low back pain without sciatica 02/10/2020   Spondylosis without myelopathy  or radiculopathy, lumbar region 12/04/2017   Type 2 diabetes mellitus with hyperglycemia    Normal coronary arteries 04/17/2016   OSA on CPAP 10/28/2014   Major depressive disorder 10/28/2014   Chronic systolic CHF (congestive heart failure), NYHA class 3 12/06/2013   Hyperlipidemia associated with type 2 diabetes mellitus (HCC), on Zocor 04/04/2011   Abnormal electrocardiography 01/16/2009   Hypertension associated with diabetes  06/03/2008   Morbid obesity with BMI of 50.0-59.9, adult 07/12/2006   NICM (nonischemic cardiomyopathy) 07/12/2006   Attention deficit disorder 07/12/2006   Migraine without status migrainosus, not intractable 07/12/2006    PCP: [name], MD   REFERRING PROVIDER: Rodolph Bong, MD   REFERRING DIAG:   Chronic pain of right knee  M54.50,G89.29 (ICD-10-CM) - Chronic bilateral low back pain without sciatica    Rationale for Evaluation and Treatment: Rehabilitation  THERAPY DIAG:  Other low back pain  Chronic pain of right knee  Muscle weakness (generalized)  ONSET DATE: >5  SUBJECTIVE:                                                                                                                                                                                           SUBJECTIVE STATEMENT: Pt reports she had to have a tooth pulled yesterday.  She reports she rolled her Lt ankle while walking to rest room; caught herself and no issues this morning.   PERTINENT HISTORY:  [name] 03/19/22: 57 y.o. female with chronic low back pain and chronic right knee pain  . . . bothersome paresthesias sensation bilateral lower extremities.  . . . Right knee pain due to exacerbation of DJD.  . . . Chronic back pain thought to be due to core weakness and facet arthritis changes seen on CT scan.   PAIN:  Are you having pain? Yes: NPRS scale:9/10 Pain location: LB to bilat hips Pain description: ache, pressure Aggravating factors: standing and moving, walking through home Relieving factors: sitting  PRECAUTIONS: Knee and Back  WEIGHT BEARING RESTRICTIONS: No  FALLS:  Has patient fallen in last 6 months? No  LIVING ENVIRONMENT: Lives with: lives with their family and lives with their spouse Lives in: House/apartment Stairs: Yes: Internal: 16 steps; on right going up Has following equipment at home: Dan Humphreys - 4 wheeled and shower chair  OCCUPATION: disabled  PLOF: Needs  assistance with ADLs, Needs assistance with homemaking, and Needs assistance with gait  PATIENT GOALS: increased movement without pain, ADL's, cook, climb stairs, go to the store without hurting  NEXT MD VISIT: 6 weeks  OBJECTIVE:   DIAGNOSTIC FINDINGS:  X-ray images right knee:  Mild medial compartment DJD.  Mild patellofemoral DJD  CT scan images of lumbar spine: Diffuse degenerative changes present throughout lumbar spine   PATIENT SURVEYS:  FOTO Primary score 22% with goal 39%  SCREENING FOR RED FLAGS: none  COGNITION: Overall cognitive status: Within functional limits for tasks assessed     SENSATION: wfl  POSTURE: increased thoracic kyphosis,right shoulder elevated  PALPATION: No tenderness although difficulty to assess due to body habitus  LUMBAR ROM:   AROM eval  Flexion Mid shin  Extension 0  Right lateral flexion 50%  Left lateral flexion 50%  Right rotation 100%  Left rotation 100%   (Blank rows = not tested)  LOWER EXTREMITY ROM:      Limited due to body habitus  LOWER EXTREMITY MMT:    MMT Right eval Left eval  Hip flexion 3+ 3+  Hip extension    Hip abduction    Hip adduction    Hip internal rotation    Hip external rotation    Knee flexion 4- 4-  Knee extension 4+ 4+  Ankle dorsiflexion    Ankle plantarflexion    Ankle inversion    Ankle eversion     (Blank rows = not tested)  LUMBAR SPECIAL TESTS:  Slump test: Negative  FUNCTIONAL TESTS:  5 times sit to stand: 24.16 seated on scooter Timed up and go (TUG): 20.90 from seated on scooter Berg: 35/56  GAIT: Distance walked: 30 ft Assistive device utilized: None Level of assistance:  none Comments: shoulder retraction, antalgic gait pattern  TODAY'S TREATMENT:                                                                                                                              Pt seen for aquatic therapy today.  Treatment took place in water 3.5-4.75 ft in depth at the  [facility] pool. Temp of water was 91.  Pt entered/exited the pool via stairs independently with bilat rail.  * without support:  walking forward/ backwards - 3 laps  * side stepping with shoulder addct with rainbow hand floats x 2 laps  * marching forward/ backward with rainbow hand floats under water * staggered stance with bilat  * Lt forward step up/Rt retro step down x 10 with light UE support on rails * holding wall:  straight LE circles (outward) x 10;  heel / toe raises x 10 *TrA set with alternating + bilat rainbow hand float  pull downs x 10 * straddling yellow noodle: cycling with breast stroke arms (forward / backward) scissor LEs and cc ski; return to cycling  * plank with hands on bench - alternating hip ext x 10; alternating fire hydrants x 5 * STS at bench in water with feet on blue step with cues for ab set, forward arm reach, hip hinge  x 10  Pt requires the buoyancy and hydrostatic pressure of water for support, and to offload joints by unweighting joint  load by at least 50 % in navel deep water and by at least 75-80% in chest to neck deep water.  Viscosity of the water is needed for resistance of strengthening. Water current perturbations provides challenge to standing balance requiring increased core activation.     PATIENT EDUCATION:  Education details: Aquatic therapy progressions/ modifications  Person educated: Patient Education method: Explanation Education comprehension: verbalized understanding  HOME EXERCISE PROGRAM: tba  ASSESSMENT:  CLINICAL IMPRESSION: Pt completing increased volume of exercises with resistance with good tolerance. No difficulty tolerating STS at bench.  Pt given encouragement to allow for gradual improvement in standing and movement tolerance on land; suggested she use timer to get a baseline of standing tolerance.   She will benefit from skilled physical therapy beginning aquatic based which she will benefit from the  properties of water to improve gait, strength and balance as well as toleration to activity then will transition land based for advancing of treatment. CK STG next visit.   OBJECTIVE IMPAIRMENTS: decreased activity tolerance, decreased balance, decreased endurance, decreased knowledge of use of DME, decreased mobility, difficulty walking, decreased strength, impaired sensation, postural dysfunction, obesity, and pain.   ACTIVITY LIMITATIONS: carrying, lifting, bending, sitting, standing, squatting, stairs, transfers, bed mobility, locomotion level, and caring for others  PARTICIPATION LIMITATIONS: meal prep, cleaning, laundry, shopping, community activity, and occupation  PERSONAL FACTORS: Age, Behavior pattern, Fitness, Time since onset of injury/illness/exacerbation, and 1-2 comorbidities: cardiomyopathy, Oa knees and back, depression/anxiety  are also affecting patient's functional outcome.   REHAB POTENTIAL: Good  CLINICAL DECISION MAKING: Evolving/moderate complexity  EVALUATION COMPLEXITY: Moderate   GOALS: Goals reviewed with patient? Yes  SHORT TERM GOALS: Target date: 06/24/22  Pt will tolerate full aquatic sessions consistently without increase in pain and with improving function to demonstrate good toleration and effectiveness of intervention.   Baseline:TBA Goal status:MET - 06/14/22  2.  Pt will climb stairs using combination of step to and alternating pattern without pausing consistently Baseline: improving - some pauses, but not consistent - 06/19/22 Goal status:ONGOING   3.  Pt will report climbing stairs at home 1-2 x weekly to access bedroom and full bath more regularly. Baseline: <1x weekly Goal status: INITIAL  4.  Pt will tolerate walking 8-10 continuous minutes in pool to demonstrate improving toleration to activity Baseline: na Goal status: MET - 06/14/22  5.  Pt will improve on 5 X STS test to <or=   18s to demonstrate improving functional lower extremity  strength, transitional movements, and balance Baseline: 24.16 Goal status: INITIAL  6.  Pt will improve on Tug test to <or= 14s to demonstrate improvement in lower extremity function, mobility and decreased fall risk.  Baseline:  20.90 Goal status: INITIAL  LONG TERM GOALS: Target date: 07/19/22  Pt to meet stated Foto Goal of 39% to demonstrate improved perception of ability Baseline: 22% Goal status: INITIAL  2.  Pt will be indep with final HEP's (land and aquatic as appropriate) for continued management of condition  Baseline:  Goal status: INITIAL  3.  Pt will walk in store for up to or >15 mins pushing cart Baseline: rides scooter Goal status: INITIAL  4.  Pt will report decrease in pain with amb to <5/10 to demonstrate improvement management of condition Baseline: 9/10 Goal status: INITIAL  5.  Pt will improve on Berg balance test to >/= 50/56 to demonstrate a decrease in fall risk.  Baseline: 35/56 Goal status: INITIAL  6.  Pt will increase strength of  hip by 1 full grade to improve functional mobility. Baseline: see chart Goal status: INITIAL  PLAN:  PT FREQUENCY: 1-2x/week  PT DURATION: 10 weeks skipping 1st 2 weeks to address medical issue  PLANNED INTERVENTIONS: Therapeutic exercises, Therapeutic activity, Neuromuscular re-education, Balance training, Gait training, Patient/Family education, Self Care, Joint mobilization, Stair training, DME instructions, Aquatic Therapy, Dry Needling, Cryotherapy, Moist heat, Taping, Ultrasound, Biofeedback, Ionotophoresis /ml Dexamethasone, Manual therapy, and Re-evaluation.  PLAN FOR NEXT SESSION: aquatics for general strengthening, gait and balance retraining, proprioceptive retraining, toleration to activity/aerobic capacity, and pain reduction  Mayer Camel, PTA 06/19/22 12:01 PM [facility] [address] [place], [place], [address] Phone: [phone]    Fax:  [phone]

## 2022-06-19 NOTE — Patient Instructions (Signed)
1 pill twice a day of the megace

## 2022-06-20 LAB — FOLLICLE STIMULATING HORMONE: FSH: 18.9 m[IU]/mL

## 2022-06-20 LAB — ESTRADIOL: Estradiol: 17.3 pg/mL

## 2022-06-21 ENCOUNTER — Ambulatory Visit (HOSPITAL_BASED_OUTPATIENT_CLINIC_OR_DEPARTMENT_OTHER): Payer: Medicare Other | Admitting: Physical Therapy

## 2022-06-21 ENCOUNTER — Encounter (HOSPITAL_BASED_OUTPATIENT_CLINIC_OR_DEPARTMENT_OTHER): Payer: Self-pay | Admitting: Physical Therapy

## 2022-06-21 DIAGNOSIS — M25561 Pain in right knee: Secondary | ICD-10-CM | POA: Diagnosis not present

## 2022-06-21 DIAGNOSIS — G8929 Other chronic pain: Secondary | ICD-10-CM

## 2022-06-21 DIAGNOSIS — M5459 Other low back pain: Secondary | ICD-10-CM

## 2022-06-21 DIAGNOSIS — M6281 Muscle weakness (generalized): Secondary | ICD-10-CM | POA: Diagnosis not present

## 2022-06-21 NOTE — Therapy (Signed)
OUTPATIENT PHYSICAL THERAPY THORACOLUMBAR TREATMENT   Patient Name: [name] MRN: [id number] DOB:[birth year], 57 y.o., female Today's Date: 06/21/2022  END OF SESSION:  PT End of Session - 06/21/22 1123     Visit Number 7    Number of Visits 18    Date for PT Re-Evaluation 07/19/22    Authorization Type MCR    PT Start Time 1115    PT Stop Time 1155    PT Time Calculation (min) 40 min    Activity Tolerance Patient tolerated treatment well    Behavior During Therapy WFL for tasks assessed/performed             Past Medical History:  Diagnosis Date   ADD (attention deficit disorder)    Anxiety    Arthritis    both knees right worse than left   Carpal tunnel syndrome of right wrist    Depression    Essential hypertension, benign    Gallstones    History of smoking 06/22/2016   Hyperlipidemia associated with type 2 diabetes mellitus (HCC), on Zocor 04/04/2011   Hypertension associated with diabetes (HCC) 06/03/2008   Migraines    Morbid obesity (HCC)    Morbid obesity with BMI of 50.0-59.9, adult (HCC) 07/12/2006   NICM (nonischemic cardiomyopathy) (HCC) 07/12/2006   01/16/18 ECHO:    - Procedure narrative: Transthoracic echocardiography. Image   quality was suboptimal. The study was technically difficult.   Intravenous contrast (Definity) was administered. - Left ventricle: The cavity size was moderately dilated. Wall   thickness was increased in a pattern of mild LVH. Systolic   function was moderately to severely reduced. The estimated   ejection fraction w   Normal coronary arteries 04/17/2016   OSA on CPAP 10/28/2014   Spinal stenosis    back pain   Spondylosis without myelopathy or radiculopathy, lumbar region 12/04/2017   Type 2 diabetes mellitus with hyperglycemia [facility])    Past Surgical History:  Procedure Laterality Date   CHOLECYSTECTOMY     DILATATION & CURETTAGE/HYSTEROSCOPY WITH MYOSURE N/A 10/31/2017   Procedure: DILATATION & CURETTAGE/HYSTEROSCOPY WITH MYOSURE;   Surgeon: Samantha Bolk, MD;  Location: [facility];  Service: Gynecology;  Laterality: N/A;   HYSTEROSCOPY WITH D & C N/A 05/07/2022   Procedure: DILATATION AND CURETTAGE /HYSTEROSCOPY;  Surgeon: Samantha Shire, MD;  Location: [facility] OR;  Service: Gynecology;  Laterality: N/A;   RIGHT/LEFT HEART CATH AND CORONARY ANGIOGRAPHY N/A 04/11/2016   Procedure: Right/Left Heart Cath and Coronary Angiography;  Surgeon: Samantha Hazel, MD;  Location: [facility] INV[facility] CV LAB;  Service: Cardiovascular;  Laterality: N/A;   sonogram for blood clots     no blockages   Patient Active Problem List   Diagnosis Date Noted   Postmenopausal bleeding 05/07/2022   Fear of other medical care 05/06/2022   Acute cystitis without hematuria 03/06/2022   Vaginal yeast infection 03/06/2022   Primary osteoarthritis of both knees 03/06/2022   Bilateral ovarian cysts 03/06/2022   Septic shock (HCC) 02/25/2022   Major depressive disorder, recurrent episode, severe with anxious distress (HCC) 08/09/2021   Attention deficit hyperactivity disorder (ADHD), predominantly inattentive type 08/09/2021   Need for varicella vaccine 08/09/2021   Urge incontinence 08/01/2021   Tinea corporis 03/14/2021   Hyperlipidemia LDL goal <100 03/01/2021   DDD (degenerative disc disease), lumbar 08/08/2020   Non-seasonal allergic rhinitis due to pollen 07/25/2020   Chronic idiopathic constipation 07/25/2020   Visit for screening mammogram 02/11/2020   Chronic bilateral low back pain without  sciatica 02/10/2020   Spondylosis without myelopathy or radiculopathy, lumbar region 12/04/2017   Type 2 diabetes mellitus with hyperglycemia (HCC)    Normal coronary arteries 04/17/2016   OSA on CPAP 10/28/2014   Major depressive disorder 10/28/2014   Chronic systolic CHF (congestive heart failure), NYHA class 3 (HCC) 12/06/2013   Hyperlipidemia associated with type 2 diabetes mellitus (HCC), on Zocor 04/04/2011   Abnormal electrocardiography  01/16/2009   Hypertension associated with diabetes (HCC) 06/03/2008   Morbid obesity with BMI of 50.0-59.9, adult (HCC) 07/12/2006   NICM (nonischemic cardiomyopathy) (HCC) 07/12/2006   Attention deficit disorder 07/12/2006   Migraine without status migrainosus, not intractable 07/12/2006    PCP: Samantha Grandchild, MD   REFERRING PROVIDER: Rodolph Bong, MD   REFERRING DIAG:   Chronic pain of right knee  M54.50,G89.29 (ICD-10-CM) - Chronic bilateral low back pain without sciatica    Rationale for Evaluation and Treatment: Rehabilitation  THERAPY DIAG:  Other low back pain  Chronic pain of right knee  Muscle weakness (generalized)  ONSET DATE: >5  SUBJECTIVE:                                                                                                                                                                                           SUBJECTIVE STATEMENT: Pt reports she went to yard sale this morning and was able to stand on/off for ~3-6 min while looking at clothes.   She reports she gave herself pep talk to endure the standing.    PERTINENT HISTORY:  Samantha Saucier MD 03/19/22: 57 y.o. female with chronic low back pain and chronic right knee pain  . . . bothersome paresthesias sensation bilateral lower extremities.  . . . Right knee pain due to exacerbation of DJD.  . . . Chronic back pain thought to be due to core weakness and facet arthritis changes seen on CT scan.   PAIN:  Are you having pain? Yes: NPRS scale:8/10 Pain location: LB to bilat hips Pain description: ache, pressure Aggravating factors: standing and moving, walking through home Relieving factors: sitting  PRECAUTIONS: Knee and Back  WEIGHT BEARING RESTRICTIONS: No  FALLS:  Has patient fallen in last 6 months? No  LIVING ENVIRONMENT: Lives with: lives with their family and lives with their spouse Lives in: House/apartment Stairs: Yes: Internal: 16 steps; on right going up Has following  equipment at home: Walker - 4 wheeled and shower chair  OCCUPATION: disabled  PLOF: Needs assistance with ADLs, Needs assistance with homemaking, and Needs assistance with gait  PATIENT GOALS: increased movement without pain, ADL's, cook, climb stairs, go to the store without  hurting  NEXT MD VISIT: 6 weeks  OBJECTIVE:   DIAGNOSTIC FINDINGS:  X-ray images right knee: Mild medial compartment DJD.  Mild patellofemoral DJD  CT scan images of lumbar spine: Diffuse degenerative changes present throughout lumbar spine   PATIENT SURVEYS:  FOTO Primary score 22% with goal 39%  SCREENING FOR RED FLAGS: none  COGNITION: Overall cognitive status: Within functional limits for tasks assessed     SENSATION: wfl  POSTURE: increased thoracic kyphosis,right shoulder elevated  PALPATION: No tenderness although difficulty to assess due to body habitus  LUMBAR ROM:   AROM eval  Flexion Mid shin  Extension 0  Right lateral flexion 50%  Left lateral flexion 50%  Right rotation 100%  Left rotation 100%   (Blank rows = not tested)  LOWER EXTREMITY ROM:      Limited due to body habitus  LOWER EXTREMITY MMT:    MMT Right eval Left eval  Hip flexion 3+ 3+  Hip extension    Hip abduction    Hip adduction    Hip internal rotation    Hip external rotation    Knee flexion 4- 4-  Knee extension 4+ 4+  Ankle dorsiflexion    Ankle plantarflexion    Ankle inversion    Ankle eversion     (Blank rows = not tested)  LUMBAR SPECIAL TESTS:  Slump test: Negative  FUNCTIONAL TESTS:  5 times sit to stand: 24.16 seated on scooter Timed up and go (TUG): 20.90 from seated on scooter Berg: 35/56  06/21/22:  5x STS:  18.40 seated on scooter    TUG 12.91 from seated on scooter  GAIT: Distance walked: 30 ft Assistive device utilized: None Level of assistance:  none Comments: shoulder retraction, antalgic gait pattern  TODAY'S TREATMENT:                                                                                                                               Prior to entry in water:  TUG / 5 x STS  Pt seen for aquatic therapy today.  Treatment took place in water 3.5-4.75 ft in depth at the [facility] pool. Temp of water was 91.  Pt entered/exited the pool via stairs independently with bilat rail.  * gentle forward doggy paddle to deeper water followed by back float for decompression * holding yellow noodle:  grapevine L/ R with demo on deck x 2 laps * side stepping with shoulder addct with rainbow hand floats x 2 laps  * marching forward/ backward with bilat  then single rainbow hand floats under water * staggered stance, then wide stance with kick board row x 10 each LE forward  * Lt forward step up/Rt retro step down x 10 with light UE support on rails; Lt lateral step ups UE on wall x 10 * holding wall:  straight LE circles (outward) x 5;  heel / toe raises x 10 *TrA set with alternating +  bilat rainbow hand float  pull downs x 5 * plank with hands on bench - alternating hip ext x 10; alternating fire hydrants x 5 Pt requires the buoyancy and hydrostatic pressure of water for support, and to offload joints by unweighting joint load by at least 50 % in navel deep water and by at least 75-80% in chest to neck deep water.  Viscosity of the water is needed for resistance of strengthening. Water current perturbations provides challenge to standing balance requiring increased core activation.     PATIENT EDUCATION:  Education details: Aquatic therapy progressions/ modifications  Person educated: Patient Education method: Explanation Education comprehension: verbalized understanding  HOME EXERCISE PROGRAM: tba  ASSESSMENT:  CLINICAL IMPRESSION: Pt demonstrated improved 5x STS and TUG times; has partially met STG 5 and met STG6.  She is tolerating exercise in water for full length of session without increase in pain and without need for rest break.  She  will benefit from skilled physical therapy beginning aquatic based which she will benefit from the properties of water to improve gait, strength and balance as well as toleration to activity then will transition land based for advancing of treatment.  Check STG 2 and 3 next session.    OBJECTIVE IMPAIRMENTS: decreased activity tolerance, decreased balance, decreased endurance, decreased knowledge of use of DME, decreased mobility, difficulty walking, decreased strength, impaired sensation, postural dysfunction, obesity, and pain.   ACTIVITY LIMITATIONS: carrying, lifting, bending, sitting, standing, squatting, stairs, transfers, bed mobility, locomotion level, and caring for others  PARTICIPATION LIMITATIONS: meal prep, cleaning, laundry, shopping, community activity, and occupation  PERSONAL FACTORS: Age, Behavior pattern, Fitness, Time since onset of injury/illness/exacerbation, and 1-2 comorbidities: cardiomyopathy, Oa knees and back, depression/anxiety  are also affecting patient's functional outcome.   REHAB POTENTIAL: Good  CLINICAL DECISION MAKING: Evolving/moderate complexity  EVALUATION COMPLEXITY: Moderate   GOALS: Goals reviewed with patient? Yes  SHORT TERM GOALS: Target date: 06/24/22  Pt will tolerate full aquatic sessions consistently without increase in pain and with improving function to demonstrate good toleration and effectiveness of intervention.   Baseline:TBA Goal status:MET - 06/14/22  2.  Pt will climb stairs using combination of step to and alternating pattern without pausing consistently Baseline: improving - some pauses, but not consistent - 06/19/22 Goal status:ONGOING   3.  Pt will report climbing stairs at home 1-2 x weekly to access bedroom and full bath more regularly. Baseline: <1x weekly Goal status: INITIAL  4.  Pt will tolerate walking 8-10 continuous minutes in pool to demonstrate improving toleration to activity Baseline: na Goal status: MET -  06/14/22  5.  Pt will improve on 5 X STS test to <or=   18s to demonstrate improving functional lower extremity strength, transitional movements, and balance Baseline: 24.16 at eval;  18.4s on 06/21/22 Goal status: PARTIALLY MET   6.  Pt will improve on Tug test to <or= 14s to demonstrate improvement in lower extremity function, mobility and decreased fall risk.  Baseline:  20.90 at eval;  12.91 on 06/21/22 Goal status: MET   LONG TERM GOALS: Target date: 07/19/22  Pt to meet stated Foto Goal of 39% to demonstrate improved perception of ability Baseline: 22% Goal status: INITIAL  2.  Pt will be indep with final HEP's (land and aquatic as appropriate) for continued management of condition  Baseline:  Goal status: INITIAL  3.  Pt will walk in store for up to or >15 mins pushing cart Baseline: rides scooter Goal status: INITIAL  4.  Pt will report decrease in pain with amb to <5/10 to demonstrate improvement management of condition Baseline: 9/10 Goal status: INITIAL  5.  Pt will improve on Berg balance test to >/= 50/56 to demonstrate a decrease in fall risk.  Baseline: 35/56 Goal status: INITIAL  6.  Pt will increase strength of hip by 1 full grade to improve functional mobility. Baseline: see chart Goal status: INITIAL  PLAN:  PT FREQUENCY: 1-2x/week  PT DURATION: 10 weeks skipping 1st 2 weeks to address medical issue  PLANNED INTERVENTIONS: Therapeutic exercises, Therapeutic activity, Neuromuscular re-education, Balance training, Gait training, Patient/Family education, Self Care, Joint mobilization, Stair training, DME instructions, Aquatic Therapy, Dry Needling, Cryotherapy, Moist heat, Taping, Ultrasound, Biofeedback, Ionotophoresis 4mg /ml Dexamethasone, Manual therapy, and Re-evaluation.  PLAN FOR NEXT SESSION: aquatics for general strengthening, gait and balance retraining, proprioceptive retraining, toleration to activity/aerobic capacity, and pain reduction  Mayer Camel, PTA 06/21/22 12:43 PM [facility] [address] [place], [place], [address] Phone: [phone]   Fax:  [phone]

## 2022-06-25 ENCOUNTER — Telehealth (HOSPITAL_COMMUNITY): Payer: Self-pay | Admitting: Internal Medicine

## 2022-06-25 ENCOUNTER — Telehealth: Payer: Self-pay

## 2022-06-25 ENCOUNTER — Ambulatory Visit (INDEPENDENT_AMBULATORY_CARE_PROVIDER_SITE_OTHER): Payer: Medicare Other | Admitting: Family Medicine

## 2022-06-25 VITALS — BP 138/80 | HR 94 | Ht 64.0 in

## 2022-06-25 DIAGNOSIS — M545 Low back pain, unspecified: Secondary | ICD-10-CM | POA: Diagnosis not present

## 2022-06-25 DIAGNOSIS — M797 Fibromyalgia: Secondary | ICD-10-CM

## 2022-06-25 DIAGNOSIS — G8929 Other chronic pain: Secondary | ICD-10-CM

## 2022-06-25 MED ORDER — PREGABALIN 75 MG PO CAPS: 75.0000 mg | ORAL_CAPSULE | Freq: Two times a day (BID) | ORAL | 3 refills | Status: DC | PRN

## 2022-06-25 NOTE — Patient Instructions (Signed)
Thank you for coming in today.   Continue aquatic PT.  Look to see membership at the [facility].   Start Lyrica. Indication for Fibromyalgia. This will also help the numb tingling in your legs.   I am working to get a MRI of your low back.   Recheck after we get the results back to plan for injections.

## 2022-06-25 NOTE — Progress Notes (Signed)
Rubin Payor, PhD, LAT, ATC acting as a scribe for Samantha Graham, MD.  [name] is a 57 y.o. female who presents to [facility] at [facility] today for f/u R knee and lumbar radiculopathy. Pt was last seen by Dr. Denyse Amass on 03/19/22 and was given a R knee steroid injection, was prescribed Lyrica, and referred to PT, completing 7 visits. States that she feels like she may be here too early but states pool therapy is amazing and awful at the same time. During therapy feels great while doing it but later that evening she will be hurting pretty bad. States she has seen some progress states she has been able to stand for longer periods of time unlike before. Patient feels discouraged because she feels like she  is benefiting from this therapy and she states she has only till the end of may for this program but feels like she is needing to go longer to help. Patient does not want to go back to the way it was before. States this is insurance wise.   Today, pt reports continued back pain.  She notes pain improving with aquatic therapy as noted above.  She is trying to find a place where she can continue home exercise program in the pool.  She does notes pain multiple locations in her body thought to be fibromyalgia.   Dx imaging: 03/19/22 R knee XR 07/31/20 L-spine MRI             03/08/20 L-spine XR  Pertinent review of systems: No fevers or chills  Relevant historical information: Diabetes, obesity   Exam:  BP 138/80   Pulse 94   Ht 5\' 4"  (1.626 m)   SpO2 95%   BMI 52.73 kg/m  General: Well Developed, well nourished, and in no acute distress.   MSK: L-spine nontender palpation spinal midline.  Tender palpation bilateral paraspinal musculature. Decreased lumbar motion.     Lab and Radiology Results  CT scan images of lumbar spine visible on CT scan abdomen or pelvis obtained February 25, 2022 interpreted January 23 during a prior visit Diffuse degenerative changes  present throughout lumbar spine with facet DJD present lower portion lumbar spine.   Assessment and Plan: 57 y.o. female with chronic bilateral low back pain.  Pain thought to be due to facet DJD and muscle dysfunction.  She is getting benefit some from physical therapy but is not quite good enough.  Plan to continue PT and transition to home exercise program using a pool when able. Will proceed to lumbar spine MRI to further evaluate specifics of facet arthritis for facet injection planning.  Recheck after MRI.  She also has fibromyalgia.  Will start Lyrica for fibromyalgia.     PDMP reviewed during this encounter. Orders Placed This Encounter  Procedures   MR Lumbar Spine Wo Contrast    Standing Status:   Future    Standing Expiration Date:   06/25/2023    Order Specific Question:   What is the patient's sedation requirement?    Answer:   No Sedation    Order Specific Question:   Does the patient have a pacemaker or implanted devices?    Answer:   No    Order Specific Question:   Preferred imaging location?    Answer:   [facility] (table limit-550lbs)   Meds ordered this encounter  Medications   pregabalin (LYRICA) 75 MG capsule    Sig: Take 1 capsule (75 mg total)  by mouth 2 (two) times daily as needed.    Dispense:  60 capsule    Refill:  3    Fibromyalgia     Discussed warning signs or symptoms. Please see discharge instructions. Patient expresses understanding.   The above documentation has been reviewed and is accurate and complete [name], M.D. Total encounter time 30 minutes including face-to-face time with the patient and, reviewing past medical record, and charting on the date of service.

## 2022-06-25 NOTE — Telephone Encounter (Addendum)
-----   Message from [name], MD sent at 06/24/2022  7:33 PM EDT ----- Please notify that she still has some estrogen and the hormones are not completely consistent with someone who is postmenopausal and recommend continuing megace for now to manage bleeding and keep follow up GYN visit to see how her bleeding is controlled  Left message stating to return call to the office.   [name]  06/25/22

## 2022-06-26 ENCOUNTER — Encounter (HOSPITAL_BASED_OUTPATIENT_CLINIC_OR_DEPARTMENT_OTHER): Payer: Self-pay | Admitting: Physical Therapy

## 2022-06-26 ENCOUNTER — Ambulatory Visit (HOSPITAL_BASED_OUTPATIENT_CLINIC_OR_DEPARTMENT_OTHER): Payer: Medicare Other | Attending: Family Medicine | Admitting: Physical Therapy

## 2022-06-26 DIAGNOSIS — M6281 Muscle weakness (generalized): Secondary | ICD-10-CM | POA: Diagnosis not present

## 2022-06-26 DIAGNOSIS — M545 Low back pain, unspecified: Secondary | ICD-10-CM | POA: Diagnosis not present

## 2022-06-26 DIAGNOSIS — M25561 Pain in right knee: Secondary | ICD-10-CM | POA: Insufficient documentation

## 2022-06-26 DIAGNOSIS — M5459 Other low back pain: Secondary | ICD-10-CM | POA: Diagnosis not present

## 2022-06-26 DIAGNOSIS — G8929 Other chronic pain: Secondary | ICD-10-CM | POA: Diagnosis not present

## 2022-06-26 NOTE — Therapy (Signed)
OUTPATIENT PHYSICAL THERAPY THORACOLUMBAR TREATMENT   Patient Name: [name] MRN: [id number] DOB:13-Dec-1965, 57 y.o., female Today's Date: 06/26/2022  END OF SESSION:  PT End of Session - 06/26/22 1127     Visit Number 8    Number of Visits 18    Date for PT Re-Evaluation 07/19/22    Authorization Type MCR    PT Start Time 1119    PT Stop Time 1158    PT Time Calculation (min) 39 min    Activity Tolerance Patient tolerated treatment well    Behavior During Therapy WFL for tasks assessed/performed             Past Medical History:  Diagnosis Date   ADD (attention deficit disorder)    Anxiety    Arthritis    both knees right worse than left   Carpal tunnel syndrome of right wrist    Depression    Essential hypertension, benign    Gallstones    History of smoking 06/22/2016   Hyperlipidemia associated with type 2 diabetes mellitus (HCC), on Zocor 04/04/2011   Hypertension associated with diabetes (HCC) 06/03/2008   Migraines    Morbid obesity (HCC)    Morbid obesity with BMI of 50.0-59.9, adult (HCC) 07/12/2006   NICM (nonischemic cardiomyopathy) (HCC) 07/12/2006   01/16/18 ECHO:    - Procedure narrative: Transthoracic echocardiography. Image   quality was suboptimal. The study was technically difficult.   Intravenous contrast (Definity) was administered. - Left ventricle: The cavity size was moderately dilated. Wall   thickness was increased in a pattern of mild LVH. Systolic   function was moderately to severely reduced. The estimated   ejection fraction w   Normal coronary arteries 04/17/2016   OSA on CPAP 10/28/2014   Spinal stenosis    back pain   Spondylosis without myelopathy or radiculopathy, lumbar region 12/04/2017   Type 2 diabetes mellitus with hyperglycemia [facility])    Past Surgical History:  Procedure Laterality Date   CHOLECYSTECTOMY     DILATATION & CURETTAGE/HYSTEROSCOPY WITH MYOSURE N/A 10/31/2017   Procedure: DILATATION & CURETTAGE/HYSTEROSCOPY WITH MYOSURE;   Surgeon: Romualdo Bolk, MD;  Location: [facility];  Service: Gynecology;  Laterality: N/A;   HYSTEROSCOPY WITH D & C N/A 05/07/2022   Procedure: DILATATION AND CURETTAGE /HYSTEROSCOPY;  Surgeon: [name], MD;  Location: [facility] OR;  Service: Gynecology;  Laterality: N/A;   RIGHT/LEFT HEART CATH AND CORONARY ANGIOGRAPHY N/A 04/11/2016   Procedure: Right/Left Heart Cath and Coronary Angiography;  Surgeon: Kathleene Hazel, MD;  Location: [facility] INVASIVE CV LAB;  Service: Cardiovascular;  Laterality: N/A;   sonogram for blood clots     no blockages   Patient Active Problem List   Diagnosis Date Noted   Postmenopausal bleeding 05/07/2022   Fear of other medical care 05/06/2022   Acute cystitis without hematuria 03/06/2022   Vaginal yeast infection 03/06/2022   Primary osteoarthritis of both knees 03/06/2022   Bilateral ovarian cysts 03/06/2022   Septic shock (HCC) 02/25/2022   Major depressive disorder, recurrent episode, severe with anxious distress (HCC) 08/09/2021   Attention deficit hyperactivity disorder (ADHD), predominantly inattentive type 08/09/2021   Need for varicella vaccine 08/09/2021   Urge incontinence 08/01/2021   Tinea corporis 03/14/2021   Hyperlipidemia LDL goal <100 03/01/2021   DDD (degenerative disc disease), lumbar 08/08/2020   Non-seasonal allergic rhinitis due to pollen 07/25/2020   Chronic idiopathic constipation 07/25/2020   Visit for screening mammogram 02/11/2020   Chronic bilateral low back pain without  sciatica 02/10/2020   Spondylosis without myelopathy or radiculopathy, lumbar region 12/04/2017   Type 2 diabetes mellitus with hyperglycemia (HCC)    Normal coronary arteries 04/17/2016   OSA on CPAP 10/28/2014   Major depressive disorder 10/28/2014   Chronic systolic CHF (congestive heart failure), NYHA class 3 (HCC) 12/06/2013   Hyperlipidemia associated with type 2 diabetes mellitus (HCC), on Zocor 04/04/2011   Abnormal electrocardiography  01/16/2009   Hypertension associated with diabetes (HCC) 06/03/2008   Morbid obesity with BMI of 50.0-59.9, adult (HCC) 07/12/2006   NICM (nonischemic cardiomyopathy) (HCC) 07/12/2006   Attention deficit disorder 07/12/2006   Migraine without status migrainosus, not intractable 07/12/2006    PCP: [name], MD   REFERRING PROVIDER: Rodolph Bong, MD   REFERRING DIAG:   Chronic pain of right knee  M54.50,G89.29 (ICD-10-CM) - Chronic bilateral low back pain without sciatica    Rationale for Evaluation and Treatment: Rehabilitation  THERAPY DIAG:  Other low back pain  Chronic pain of right knee  Muscle weakness (generalized)  ONSET DATE: >5  SUBJECTIVE:                                                                                                                                                                                           SUBJECTIVE STATEMENT: Pt reports she would like to continue with aquatic therapy and is trying to figure out where she can go that is affordable for her.     PERTINENT HISTORY:  [name] 03/19/22: 57 y.o. female with chronic low back pain and chronic right knee pain  . . . bothersome paresthesias sensation bilateral lower extremities.  . . . Right knee pain due to exacerbation of DJD.  . . . Chronic back pain thought to be due to core weakness and facet arthritis changes seen on CT scan.   PAIN:  Are you having pain? Yes: NPRS scale:8/10 Pain location: LB to bilat hips Pain description: ache, pressure Aggravating factors: standing and moving, walking through home Relieving factors: sitting  PRECAUTIONS: Knee and Back  WEIGHT BEARING RESTRICTIONS: No  FALLS:  Has patient fallen in last 6 months? No  LIVING ENVIRONMENT: Lives with: lives with their family and lives with their spouse Lives in: House/apartment Stairs: Yes: Internal: 16 steps; on right going up Has following equipment at home: Dan Humphreys - 4 wheeled and shower  chair  OCCUPATION: disabled  PLOF: Needs assistance with ADLs, Needs assistance with homemaking, and Needs assistance with gait  PATIENT GOALS: increased movement without pain, ADL's, cook, climb stairs, go to the store without hurting  NEXT MD VISIT: 6 weeks  OBJECTIVE:  DIAGNOSTIC FINDINGS:  X-ray images right knee: Mild medial compartment DJD.  Mild patellofemoral DJD  CT scan images of lumbar spine: Diffuse degenerative changes present throughout lumbar spine   PATIENT SURVEYS:  FOTO Primary score 22% with goal 39%  SCREENING FOR RED FLAGS: none  COGNITION: Overall cognitive status: Within functional limits for tasks assessed     SENSATION: wfl  POSTURE: increased thoracic kyphosis,right shoulder elevated  PALPATION: No tenderness although difficulty to assess due to body habitus  LUMBAR ROM:   AROM eval  Flexion Mid shin  Extension 0  Right lateral flexion 50%  Left lateral flexion 50%  Right rotation 100%  Left rotation 100%   (Blank rows = not tested)  LOWER EXTREMITY ROM:      Limited due to body habitus  LOWER EXTREMITY MMT:    MMT Right eval Left eval  Hip flexion 3+ 3+  Hip extension    Hip abduction    Hip adduction    Hip internal rotation    Hip external rotation    Knee flexion 4- 4-  Knee extension 4+ 4+  Ankle dorsiflexion    Ankle plantarflexion    Ankle inversion    Ankle eversion     (Blank rows = not tested)  LUMBAR SPECIAL TESTS:  Slump test: Negative  FUNCTIONAL TESTS:  5 times sit to stand: 24.16 seated on scooter Timed up and go (TUG): 20.90 from seated on scooter Berg: 35/56  06/21/22:  5x STS:  18.40 seated on scooter    TUG 12.91 from seated on scooter  GAIT: Distance walked: 30 ft Assistive device utilized: None Level of assistance:  none Comments: shoulder retraction, antalgic gait pattern  TODAY'S TREATMENT:                                                                                                                               Pt seen for aquatic therapy today.  Treatment took place in water 3.5-4.75 ft in depth at the [facility] pool. Temp of water was 91.  Pt entered/exited the pool via stairs independently with bilat rail.  * without support:  walking forward/ backward  *  grapevine L/ R  x 2 laps * side stepping with shoulder addct with rainbow hand floats x 1 laps ; without floats x 1 lap * staggered stance with kick board row x 10 each LE forward x 2 sets * marching forward/ backward with bilat  then single rainbow hand floats under water * holding solid noodle:  straight LE circles (outward) x 10;  heel / toe raises x 10 *TrA set with solid noodle  pull downs x 10 * light jog forward/ backward * straddling yellow noodle and cycling forward / backward; cc ski, jumping jack LE suspended; cycling  Pt requires the buoyancy and hydrostatic pressure of water for support, and to offload joints by unweighting joint load by at least 50 % in navel deep water  and by at least 75-80% in chest to neck deep water.  Viscosity of the water is needed for resistance of strengthening. Water current perturbations provides challenge to standing balance requiring increased core activation.     PATIENT EDUCATION:  Education details: Aquatic therapy progressions/ modifications   --given list of pools hand out  Person educated: Patient Education method: Explanation Education comprehension: verbalized understanding  HOME EXERCISE PROGRAM: tba  ASSESSMENT:  CLINICAL IMPRESSION: Progressed balance and strengthening exercises today with good tolerance.  She will benefit from skilled physical therapy with aquatic based which she will benefit from the properties of water to improve gait, strength and balance as well as toleration to activity then will transition land based for advancing of treatment.   PT to complete progress note and check goals next session.    OBJECTIVE IMPAIRMENTS:  decreased activity tolerance, decreased balance, decreased endurance, decreased knowledge of use of DME, decreased mobility, difficulty walking, decreased strength, impaired sensation, postural dysfunction, obesity, and pain.   ACTIVITY LIMITATIONS: carrying, lifting, bending, sitting, standing, squatting, stairs, transfers, bed mobility, locomotion level, and caring for others  PARTICIPATION LIMITATIONS: meal prep, cleaning, laundry, shopping, community activity, and occupation  PERSONAL FACTORS: Age, Behavior pattern, Fitness, Time since onset of injury/illness/exacerbation, and 1-2 comorbidities: cardiomyopathy, Oa knees and back, depression/anxiety  are also affecting patient's functional outcome.   REHAB POTENTIAL: Good  CLINICAL DECISION MAKING: Evolving/moderate complexity  EVALUATION COMPLEXITY: Moderate   GOALS: Goals reviewed with patient? Yes  SHORT TERM GOALS: Target date: 06/24/22  Pt will tolerate full aquatic sessions consistently without increase in pain and with improving function to demonstrate good toleration and effectiveness of intervention.   Baseline:TBA Goal status:MET - 06/14/22  2.  Pt will climb stairs using combination of step to and alternating pattern without pausing consistently Baseline: improving - some pauses, but not consistent - 06/19/22 Goal status:ONGOING   3.  Pt will report climbing stairs at home 1-2 x weekly to access bedroom and full bath more regularly. Baseline: <1x weekly Goal status: INITIAL  4.  Pt will tolerate walking 8-10 continuous minutes in pool to demonstrate improving toleration to activity Baseline: na Goal status: MET - 06/14/22  5.  Pt will improve on 5 X STS test to <or=   18s to demonstrate improving functional lower extremity strength, transitional movements, and balance Baseline: 24.16 at eval;  18.4s on 06/21/22 Goal status: PARTIALLY MET   6.  Pt will improve on Tug test to <or= 14s to demonstrate improvement in lower  extremity function, mobility and decreased fall risk.  Baseline:  20.90 at eval;  12.91 on 06/21/22 Goal status: MET   LONG TERM GOALS: Target date: 07/19/22  Pt to meet stated Foto Goal of 39% to demonstrate improved perception of ability Baseline: 22% Goal status: INITIAL  2.  Pt will be indep with final HEP's (land and aquatic as appropriate) for continued management of condition  Baseline:  Goal status: INITIAL  3.  Pt will walk in store for up to or >15 mins pushing cart Baseline: rides scooter Goal status: INITIAL  4.  Pt will report decrease in pain with amb to <5/10 to demonstrate improvement management of condition Baseline: 9/10 Goal status: INITIAL  5.  Pt will improve on Berg balance test to >/= 50/56 to demonstrate a decrease in fall risk.  Baseline: 35/56 Goal status: INITIAL  6.  Pt will increase strength of hip by 1 full grade to improve functional mobility. Baseline: see chart Goal status:  INITIAL  PLAN:  PT FREQUENCY: 1-2x/week  PT DURATION: 10 weeks skipping 1st 2 weeks to address medical issue  PLANNED INTERVENTIONS: Therapeutic exercises, Therapeutic activity, Neuromuscular re-education, Balance training, Gait training, Patient/Family education, Self Care, Joint mobilization, Stair training, DME instructions, Aquatic Therapy, Dry Needling, Cryotherapy, Moist heat, Taping, Ultrasound, Biofeedback, Ionotophoresis 4mg /ml Dexamethasone, Manual therapy, and Re-evaluation.  PLAN FOR NEXT SESSION: aquatics for general strengthening, gait and balance retraining, proprioceptive retraining, toleration to activity/aerobic capacity, and pain reduction   Mayer Camel, PTA 06/26/22 1:01 PM [facility] [address] [place], [place], [address] Phone: [phone]   Fax:  [phone]

## 2022-06-26 NOTE — Telephone Encounter (Signed)
Called pt. Reviewed with patient. Patient expresses a lot of concern about results and bleeding/pain she is having. Plans to follow up with Ervin at appt next week.

## 2022-06-28 ENCOUNTER — Ambulatory Visit (HOSPITAL_BASED_OUTPATIENT_CLINIC_OR_DEPARTMENT_OTHER): Payer: Medicare Other | Admitting: Physical Therapy

## 2022-06-28 ENCOUNTER — Encounter (HOSPITAL_BASED_OUTPATIENT_CLINIC_OR_DEPARTMENT_OTHER): Payer: Self-pay | Admitting: Physical Therapy

## 2022-06-28 DIAGNOSIS — M25561 Pain in right knee: Secondary | ICD-10-CM | POA: Diagnosis not present

## 2022-06-28 DIAGNOSIS — G8929 Other chronic pain: Secondary | ICD-10-CM

## 2022-06-28 DIAGNOSIS — M6281 Muscle weakness (generalized): Secondary | ICD-10-CM | POA: Diagnosis not present

## 2022-06-28 DIAGNOSIS — M545 Low back pain, unspecified: Secondary | ICD-10-CM | POA: Diagnosis not present

## 2022-06-28 DIAGNOSIS — M5459 Other low back pain: Secondary | ICD-10-CM | POA: Diagnosis not present

## 2022-06-28 NOTE — Therapy (Signed)
OUTPATIENT PHYSICAL THERAPY THORACOLUMBAR TREATMENT Progress Note Reporting Period 05/10/22 to 06/28/22  See note below for Objective Data and Assessment of Progress/Goals.      Patient Name: [name] MRN: [id number] DOB:21-Apr-1965, 57 y.o., female Today's Date: 06/28/2022  END OF SESSION:  PT End of Session - 06/28/22 1121     Visit Number 9    Number of Visits 18    Date for PT Re-Evaluation 07/19/22    Authorization Type MCR    Progress Note Due on Visit 19    PT Start Time 1119    PT Stop Time 1200    PT Time Calculation (min) 41 min    Activity Tolerance Patient tolerated treatment well    Behavior During Therapy WFL for tasks assessed/performed             Past Medical History:  Diagnosis Date   ADD (attention deficit disorder)    Anxiety    Arthritis    both knees right worse than left   Carpal tunnel syndrome of right wrist    Depression    Essential hypertension, benign    Gallstones    History of smoking 06/22/2016   Hyperlipidemia associated with type 2 diabetes mellitus (HCC), on Zocor 04/04/2011   Hypertension associated with diabetes (HCC) 06/03/2008   Migraines    Morbid obesity (HCC)    Morbid obesity with BMI of 50.0-59.9, adult (HCC) 07/12/2006   NICM (nonischemic cardiomyopathy) (HCC) 07/12/2006   01/16/18 ECHO:    - Procedure narrative: Transthoracic echocardiography. Image   quality was suboptimal. The study was technically difficult.   Intravenous contrast (Definity) was administered. - Left ventricle: The cavity size was moderately dilated. Wall   thickness was increased in a pattern of mild LVH. Systolic   function was moderately to severely reduced. The estimated   ejection fraction w   Normal coronary arteries 04/17/2016   OSA on CPAP 10/28/2014   Spinal stenosis    back pain   Spondylosis without myelopathy or radiculopathy, lumbar region 12/04/2017   Type 2 diabetes mellitus with hyperglycemia [facility])    Past Surgical History:  Procedure  Laterality Date   CHOLECYSTECTOMY     DILATATION & CURETTAGE/HYSTEROSCOPY WITH MYOSURE N/A 10/31/2017   Procedure: DILATATION & CURETTAGE/HYSTEROSCOPY WITH MYOSURE;  Surgeon: Romualdo Bolk, MD;  Location: [facility];  Service: Gynecology;  Laterality: N/A;   HYSTEROSCOPY WITH D & C N/A 05/07/2022   Procedure: DILATATION AND CURETTAGE /HYSTEROSCOPY;  Surgeon: [name], MD;  Location: [facility] OR;  Service: Gynecology;  Laterality: N/A;   RIGHT/LEFT HEART CATH AND CORONARY ANGIOGRAPHY N/A 04/11/2016   Procedure: Right/Left Heart Cath and Coronary Angiography;  Surgeon: Kathleene Hazel, MD;  Location: [facility] INVASIVE CV LAB;  Service: Cardiovascular;  Laterality: N/A;   sonogram for blood clots     no blockages   Patient Active Problem List   Diagnosis Date Noted   Postmenopausal bleeding 05/07/2022   Fear of other medical care 05/06/2022   Acute cystitis without hematuria 03/06/2022   Vaginal yeast infection 03/06/2022   Primary osteoarthritis of both knees 03/06/2022   Bilateral ovarian cysts 03/06/2022   Septic shock (HCC) 02/25/2022   Major depressive disorder, recurrent episode, severe with anxious distress (HCC) 08/09/2021   Attention deficit hyperactivity disorder (ADHD), predominantly inattentive type 08/09/2021   Need for varicella vaccine 08/09/2021   Urge incontinence 08/01/2021   Tinea corporis 03/14/2021   Hyperlipidemia LDL goal <100 03/01/2021   DDD (degenerative disc disease), lumbar 08/08/2020  Non-seasonal allergic rhinitis due to pollen 07/25/2020   Chronic idiopathic constipation 07/25/2020   Visit for screening mammogram 02/11/2020   Chronic bilateral low back pain without sciatica 02/10/2020   Spondylosis without myelopathy or radiculopathy, lumbar region 12/04/2017   Type 2 diabetes mellitus with hyperglycemia (HCC)    Normal coronary arteries 04/17/2016   OSA on CPAP 10/28/2014   Major depressive disorder 10/28/2014   Chronic systolic CHF (congestive  heart failure), NYHA class 3 (HCC) 12/06/2013   Hyperlipidemia associated with type 2 diabetes mellitus (HCC), on Zocor 04/04/2011   Abnormal electrocardiography 01/16/2009   Hypertension associated with diabetes (HCC) 06/03/2008   Morbid obesity with BMI of 50.0-59.9, adult (HCC) 07/12/2006   NICM (nonischemic cardiomyopathy) (HCC) 07/12/2006   Attention deficit disorder 07/12/2006   Migraine without status migrainosus, not intractable 07/12/2006    PCP: [name], MD   REFERRING PROVIDER: Rodolph Bong, MD   REFERRING DIAG:   Chronic pain of right knee  M54.50,G89.29 (ICD-10-CM) - Chronic bilateral low back pain without sciatica    Rationale for Evaluation and Treatment: Rehabilitation  THERAPY DIAG:  Other low back pain  Chronic pain of right knee  Muscle weakness (generalized)  Chronic bilateral low back pain without sciatica  ONSET DATE: >5  SUBJECTIVE:                                                                                                                                                                                           SUBJECTIVE STATEMENT: Pt reports she is starting to feel better.  She has been able to stand for longer periods of time and more frequently shopping and with showering.    PERTINENT HISTORY:  [name] 03/19/22: 57 y.o. female with chronic low back pain and chronic right knee pain  . . . bothersome paresthesias sensation bilateral lower extremities.  . . . Right knee pain due to exacerbation of DJD.  . . . Chronic back pain thought to be due to core weakness and facet arthritis changes seen on CT scan.   PAIN:  Are you having pain? Yes: NPRS scale: 8/10 Pain location: LB to bilat hips Pain description: ache, pressure Aggravating factors: standing and moving, walking through home Relieving factors: sitting  PRECAUTIONS: Knee and Back  WEIGHT BEARING RESTRICTIONS: No  FALLS:  Has patient fallen in last 6 months?  No  LIVING ENVIRONMENT: Lives with: lives with their family and lives with their spouse Lives in: House/apartment Stairs: Yes: Internal: 16 steps; on right going up Has following equipment at home: Dan Humphreys - 4 wheeled and shower chair  OCCUPATION: disabled  PLOF:  Needs assistance with ADLs, Needs assistance with homemaking, and Needs assistance with gait  PATIENT GOALS: increased movement without pain, ADL's, cook, climb stairs, go to the store without hurting  NEXT MD VISIT: 6 weeks  OBJECTIVE:   DIAGNOSTIC FINDINGS:  X-ray images right knee: Mild medial compartment DJD.  Mild patellofemoral DJD  CT scan images of lumbar spine: Diffuse degenerative changes present throughout lumbar spine   PATIENT SURVEYS:  FOTO Primary score 22% with goal 39% 06/28/22: 22%  SCREENING FOR RED FLAGS: none  COGNITION: Overall cognitive status: Within functional limits for tasks assessed     SENSATION: wfl  POSTURE: increased thoracic kyphosis,right shoulder elevated  PALPATION: No tenderness although difficulty to assess due to body habitus  LUMBAR ROM:   AROM eval 06/28/22  Flexion Mid shin   Extension 0 10d  Right lateral flexion 50% 75%  Left lateral flexion 50% 75%  Right rotation 100%   Left rotation 100%    (Blank rows = not tested)  LOWER EXTREMITY ROM:      Limited due to body habitus  LOWER EXTREMITY MMT:    MMT Right eval Left eval 06/28/22 Right / left  Hip flexion 3+ 3+ 4-/5  Hip extension     Hip abduction     Hip adduction     Hip internal rotation     Hip external rotation     Knee flexion 4- 4- 4+ / 4+  Knee extension 4+ 4+ 5- / 5-  Ankle dorsiflexion     Ankle plantarflexion     Ankle inversion     Ankle eversion      (Blank rows = not tested)  LUMBAR SPECIAL TESTS:  Slump test: Negative  FUNCTIONAL TESTS:  5 times sit to stand: 24.16 seated on scooter Timed up and go (TUG): 20.90 from seated on scooter Berg: 35/56  06/21/22:  5x STS:   18.40 seated on scooter    TUG 12.91 from seated on scooter  06/28/22: Sharlene Motts: 40/56  GAIT: Distance walked: 30 ft Assistive device utilized: None Level of assistance:  none Comments: shoulder retraction, antalgic gait pattern  TODAY'S TREATMENT:                                                                                                                              Pt seen for aquatic therapy today.  Treatment took place in water 3.5-4.75 ft in depth at the [facility] pool. Temp of water was 91.  Pt entered/exited the pool via stairs independently with bilat rail.  * without support:  walking forward/ backward  *  g[place]vine L/ R  x 2 laps * side stepping with shoulder addct with rainbow hand floats x 1 laps ; without floats x 1 lap * staggered stance with kick board row x 10 each LE forward x 2 sets * marching forward/ backward with bilat  then single rainbow hand floats under water * holding solid noodle:  straight LE circles (outward) x 10;  heel / toe raises x 10 *TrA set with solid noodle  pull downs x 10 * light jog forward/ backward * straddling yellow noodle and cycling forward / backward; cc ski, jumping jack LE suspended; cycling  Pt requires the buoyancy and hydrostatic pressure of water for support, and to offload joints by unweighting joint load by at least 50 % in navel deep water and by at least 75-80% in chest to neck deep water.  Viscosity of the water is needed for resistance of strengthening. Water current perturbations provides challenge to standing balance requiring increased core activation.     PATIENT EDUCATION:  Education details: Aquatic therapy progressions/ modifications   --given list of pools hand out  Person educated: Patient Education method: Explanation Education comprehension: verbalized understanding  HOME EXERCISE PROGRAM: Pt will climb stairs 1 -2 x weekly 06/28/22  ASSESSMENT:  CLINICAL IMPRESSION: PN: Pt presents for  re-assessment. She reports she is beginning to see improvements in ability to tolerate standing and toleration of activity. (Went to a church [employer] stayed for a long time). She has demonstrated improvements on all objective testing with decreased fall risk, improved hip strength and lumbar ROM (see charts above.).  She will continue to benefit from skilled physical therapy intervention to improve her safety with mobility and maximize function. Will plan on adding land based intervention in the next few weeks when tolerated.     OBJECTIVE IMPAIRMENTS: decreased activity tolerance, decreased balance, decreased endurance, decreased knowledge of use of DME, decreased mobility, difficulty walking, decreased strength, impaired sensation, postural dysfunction, obesity, and pain.   ACTIVITY LIMITATIONS: carrying, lifting, bending, sitting, standing, squatting, stairs, transfers, bed mobility, locomotion level, and caring for others  PARTICIPATION LIMITATIONS: meal prep, cleaning, laundry, shopping, community activity, and occupation  PERSONAL FACTORS: Age, Behavior pattern, Fitness, Time since onset of injury/illness/exacerbation, and 1-2 comorbidities: cardiomyopathy, Oa knees and back, depression/anxiety  are also affecting patient's functional outcome.   REHAB POTENTIAL: Good  CLINICAL DECISION MAKING: Evolving/moderate complexity  EVALUATION COMPLEXITY: Moderate   GOALS: Goals reviewed with patient? Yes  SHORT TERM GOALS: Target date: 06/24/22  Pt will tolerate full aquatic sessions consistently without increase in pain and with improving function to demonstrate good toleration and effectiveness of intervention.   Baseline:TBA Goal status:MET - 06/14/22  2.  Pt will climb stairs using combination of step to and alternating pattern without pausing consistently Baseline: improving - some pauses, but not consistent - 06/19/22 Goal status:ONGOING   3.  Pt will report climbing stairs at  home 1-2 x weekly to access bedroom and full bath more regularly. Baseline: <1x weekly Pt reports she has been showering at therapy and has not been climbing stair Goal status: INITIAL  4.  Pt will tolerate walking 8-10 continuous minutes in pool to demonstrate improving toleration to activity Baseline: na Goal status: MET - 06/14/22  5.  Pt will improve on 5 X STS test to <or=   18s to demonstrate improving functional lower extremity strength, transitional movements, and balance Baseline: 24.16 at eval;  18.4s on 06/21/22 Goal status: PARTIALLY MET   6.  Pt will improve on Tug test to <or= 14s to demonstrate improvement in lower extremity function, mobility and decreased fall risk.  Baseline:  20.90 at eval;  12.91 on 06/21/22 Goal status: MET   LONG TERM GOALS: Target date: 07/19/22  Pt to meet stated Foto Goal of 39% to demonstrate improved perception of ability Baseline: 22% Goal  status: Ongoing 06/28/22  2.  Pt will be indep with final HEP's (land and aquatic as appropriate) for continued management of condition  Baseline:  Goal status: INITIAL  3.  Pt will walk in store for up to or >15 mins pushing cart Baseline: rides scooter Goal status: INITIAL  4.  Pt will report decrease in pain with amb to <5/10 to demonstrate improvement management of condition Baseline: 9/10 Goal status: ongoing  06/28/22  5.  Pt will improve on Berg balance test to >/= 50/56 to demonstrate a decrease in fall risk.  Baseline: 35/56 Goal status: in progress 06/28/22 (40/56)  6.  Pt will increase strength of hip by 1 full grade to improve functional mobility. Baseline: see chart Goal status: INITIAL  PLAN:  PT FREQUENCY: 1-2x/week  PT DURATION: 10 weeks skipping 1st 2 weeks to address medical issue  PLANNED INTERVENTIONS: Therapeutic exercises, Therapeutic activity, Neuromuscular re-education, Balance training, Gait training, Patient/Family education, Self Care, Joint mobilization, Stair training,  DME instructions, Aquatic Therapy, Dry Needling, Cryotherapy, Moist heat, Taping, Ultrasound, Biofeedback, Ionotophoresis 4mg /ml Dexamethasone, Manual therapy, and Re-evaluation.  PLAN FOR NEXT SESSION: aquatics for general strengthening, gait and balance retraining, proprioceptive retraining, toleration to activity/aerobic capacity, and pain reduction   Rushie Chestnut) Leeon Makar MPT 06/28/22 12:13 PM [facility] [address] [place], [place], [address] Phone: [phone]   Fax:  [phone]

## 2022-07-02 ENCOUNTER — Other Ambulatory Visit: Payer: Self-pay | Admitting: Internal Medicine

## 2022-07-02 DIAGNOSIS — N6310 Unspecified lump in the right breast, unspecified quadrant: Secondary | ICD-10-CM

## 2022-07-03 ENCOUNTER — Ambulatory Visit (INDEPENDENT_AMBULATORY_CARE_PROVIDER_SITE_OTHER): Payer: Medicare Other | Admitting: Obstetrics and Gynecology

## 2022-07-03 ENCOUNTER — Ambulatory Visit (HOSPITAL_BASED_OUTPATIENT_CLINIC_OR_DEPARTMENT_OTHER): Payer: Medicare Other | Admitting: Physical Therapy

## 2022-07-03 ENCOUNTER — Encounter: Payer: Self-pay | Admitting: Obstetrics and Gynecology

## 2022-07-03 ENCOUNTER — Encounter (HOSPITAL_BASED_OUTPATIENT_CLINIC_OR_DEPARTMENT_OTHER): Payer: Self-pay | Admitting: Physical Therapy

## 2022-07-03 ENCOUNTER — Other Ambulatory Visit: Payer: Self-pay

## 2022-07-03 VITALS — BP 103/68 | HR 104 | Wt 306.1 lb

## 2022-07-03 DIAGNOSIS — G8929 Other chronic pain: Secondary | ICD-10-CM

## 2022-07-03 DIAGNOSIS — M545 Low back pain, unspecified: Secondary | ICD-10-CM | POA: Diagnosis not present

## 2022-07-03 DIAGNOSIS — M5459 Other low back pain: Secondary | ICD-10-CM | POA: Diagnosis not present

## 2022-07-03 DIAGNOSIS — M6281 Muscle weakness (generalized): Secondary | ICD-10-CM | POA: Diagnosis not present

## 2022-07-03 DIAGNOSIS — N950 Postmenopausal bleeding: Secondary | ICD-10-CM | POA: Insufficient documentation

## 2022-07-03 DIAGNOSIS — M25561 Pain in right knee: Secondary | ICD-10-CM | POA: Diagnosis not present

## 2022-07-03 NOTE — Progress Notes (Signed)
Ms [name] present for discussion and management options for her PMB. See prior office and procedure notes.  Pt reports having vaginal bleeding off/on since last D & C. Varies from spotting to heavy with cramps and clots and lasting several days. She takes Megace when she has the heavy bleeding and the bleeding stops. Last episode of heavy bleeding 1-2 weeks ago. Does not want to have to take daily medication, if she does not have too.  H/O uterine fibroids  PE AF VSS Lungs clear Heart RRR Abd soft + BS  A/P PMB        Uterine fibroids  Causes of PMB reviewed with pt. Blood levels don't fully support postmenopausal. I suspect this and pt's body habitus are contributing to her PMB. I suspect the uterine fibroids are not the cause. Management options reviewed with pt including medication, IUD and surgery ( hyst). Following discussion pt would like to try an IUD. I think this is a good option for her. Information provided to her and IUD reviewed with her. She will schedule an appt for insertion.

## 2022-07-03 NOTE — Therapy (Signed)
OUTPATIENT PHYSICAL THERAPY THORACOLUMBAR TREATMENT    Patient Name: [name] MRN: [id number] DOB:21-Mar-1965, 57 y.o., female Today's Date: 07/03/2022  END OF SESSION:  PT End of Session - 07/03/22 1131     Visit Number 10    Number of Visits 18    Date for PT Re-Evaluation 07/19/22    Authorization Type MCR    Progress Note Due on Visit 19    PT Start Time 1118    PT Stop Time 1156    PT Time Calculation (min) 38 min    Activity Tolerance Patient tolerated treatment well    Behavior During Therapy WFL for tasks assessed/performed             Past Medical History:  Diagnosis Date   ADD (attention deficit disorder)    Anxiety    Arthritis    both knees right worse than left   Carpal tunnel syndrome of right wrist    Depression    Essential hypertension, benign    Gallstones    History of smoking 06/22/2016   Hyperlipidemia associated with type 2 diabetes mellitus (HCC), on Zocor 04/04/2011   Hypertension associated with diabetes (HCC) 06/03/2008   Migraines    Morbid obesity (HCC)    Morbid obesity with BMI of 50.0-59.9, adult (HCC) 07/12/2006   NICM (nonischemic cardiomyopathy) (HCC) 07/12/2006   01/16/18 ECHO:    - Procedure narrative: Transthoracic echocardiography. Image   quality was suboptimal. The study was technically difficult.   Intravenous contrast (Definity) was administered. - Left ventricle: The cavity size was moderately dilated. Wall   thickness was increased in a pattern of mild LVH. Systolic   function was moderately to severely reduced. The estimated   ejection fraction w   Normal coronary arteries 04/17/2016   OSA on CPAP 10/28/2014   Spinal stenosis    back pain   Spondylosis without myelopathy or radiculopathy, lumbar region 12/04/2017   Type 2 diabetes mellitus with hyperglycemia [facility])    Past Surgical History:  Procedure Laterality Date   CHOLECYSTECTOMY     DILATATION & CURETTAGE/HYSTEROSCOPY WITH MYOSURE N/A 10/31/2017   Procedure: DILATATION &  CURETTAGE/HYSTEROSCOPY WITH MYOSURE;  Surgeon: Romualdo Bolk, MD;  Location: [facility];  Service: Gynecology;  Laterality: N/A;   HYSTEROSCOPY WITH D & C N/A 05/07/2022   Procedure: DILATATION AND CURETTAGE /HYSTEROSCOPY;  Surgeon: [name], MD;  Location: [facility] OR;  Service: Gynecology;  Laterality: N/A;   RIGHT/LEFT HEART CATH AND CORONARY ANGIOGRAPHY N/A 04/11/2016   Procedure: Right/Left Heart Cath and Coronary Angiography;  Surgeon: Kathleene Hazel, MD;  Location: [facility] INVASIVE CV LAB;  Service: Cardiovascular;  Laterality: N/A;   sonogram for blood clots     no blockages   Patient Active Problem List   Diagnosis Date Noted   Postmenopausal bleeding 05/07/2022   Fear of other medical care 05/06/2022   Acute cystitis without hematuria 03/06/2022   Vaginal yeast infection 03/06/2022   Primary osteoarthritis of both knees 03/06/2022   Bilateral ovarian cysts 03/06/2022   Septic shock (HCC) 02/25/2022   Major depressive disorder, recurrent episode, severe with anxious distress (HCC) 08/09/2021   Attention deficit hyperactivity disorder (ADHD), predominantly inattentive type 08/09/2021   Need for varicella vaccine 08/09/2021   Urge incontinence 08/01/2021   Tinea corporis 03/14/2021   Hyperlipidemia LDL goal <100 03/01/2021   DDD (degenerative disc disease), lumbar 08/08/2020   Non-seasonal allergic rhinitis due to pollen 07/25/2020   Chronic idiopathic constipation 07/25/2020   Visit for screening  mammogram 02/11/2020   Chronic bilateral low back pain without sciatica 02/10/2020   Spondylosis without myelopathy or radiculopathy, lumbar region 12/04/2017   Type 2 diabetes mellitus with hyperglycemia (HCC)    Normal coronary arteries 04/17/2016   OSA on CPAP 10/28/2014   Major depressive disorder 10/28/2014   Chronic systolic CHF (congestive heart failure), NYHA class 3 (HCC) 12/06/2013   Hyperlipidemia associated with type 2 diabetes mellitus (HCC), on Zocor 04/04/2011    Abnormal electrocardiography 01/16/2009   Hypertension associated with diabetes (HCC) 06/03/2008   Morbid obesity with BMI of 50.0-59.9, adult (HCC) 07/12/2006   NICM (nonischemic cardiomyopathy) (HCC) 07/12/2006   Attention deficit disorder 07/12/2006   Migraine without status migrainosus, not intractable 07/12/2006    PCP: [name], MD   REFERRING PROVIDER: Rodolph Bong, MD   REFERRING DIAG:   Chronic pain of right knee  M54.50,G89.29 (ICD-10-CM) - Chronic bilateral low back pain without sciatica    Rationale for Evaluation and Treatment: Rehabilitation  THERAPY DIAG:  Other low back pain  Chronic pain of right knee  Muscle weakness (generalized)  ONSET DATE: >5  SUBJECTIVE:                                                                                                                                                                                           SUBJECTIVE STATEMENT: Pt reports she can tell she is getting stronger since starting PT.   PERTINENT HISTORY:  Samantha Saucier MD 03/19/22: 57 y.o. female with chronic low back pain and chronic right knee pain  . . . bothersome paresthesias sensation bilateral lower extremities.  . . . Right knee pain due to exacerbation of DJD.  . . . Chronic back pain thought to be due to core weakness and facet arthritis changes seen on CT scan.   PAIN:  Are you having pain? Yes: NPRS scale: 8/10 Pain location: LB to bilat hips Pain description: ache, pressure Aggravating factors: standing and moving, walking through home Relieving factors: sitting  PRECAUTIONS: Knee and Back  WEIGHT BEARING RESTRICTIONS: No  FALLS:  Has patient fallen in last 6 months? No  LIVING ENVIRONMENT: Lives with: lives with their family and lives with their spouse Lives in: House/apartment Stairs: Yes: Internal: 16 steps; on right going up Has following equipment at home: Dan Humphreys - 4 wheeled and shower chair  OCCUPATION:  disabled  PLOF: Needs assistance with ADLs, Needs assistance with homemaking, and Needs assistance with gait  PATIENT GOALS: increased movement without pain, ADL's, cook, climb stairs, go to the store without hurting  NEXT MD VISIT: 6 weeks  OBJECTIVE:   DIAGNOSTIC FINDINGS:  X-ray images right knee: Mild medial compartment DJD.  Mild patellofemoral DJD  CT scan images of lumbar spine: Diffuse degenerative changes present throughout lumbar spine   PATIENT SURVEYS:  FOTO Primary score 22% with goal 39% 06/28/22: 22%  SCREENING FOR RED FLAGS: none  COGNITION: Overall cognitive status: Within functional limits for tasks assessed     SENSATION: wfl  POSTURE: increased thoracic kyphosis,right shoulder elevated  PALPATION: No tenderness although difficulty to assess due to body habitus  LUMBAR ROM:   AROM eval 06/28/22  Flexion Mid shin   Extension 0 10d  Right lateral flexion 50% 75%  Left lateral flexion 50% 75%  Right rotation 100%   Left rotation 100%    (Blank rows = not tested)  LOWER EXTREMITY ROM:      Limited due to body habitus  LOWER EXTREMITY MMT:    MMT Right eval Left eval 06/28/22 Right / left  Hip flexion 3+ 3+ 4-/5  Hip extension     Hip abduction     Hip adduction     Hip internal rotation     Hip external rotation     Knee flexion 4- 4- 4+ / 4+  Knee extension 4+ 4+ 5- / 5-  Ankle dorsiflexion     Ankle plantarflexion     Ankle inversion     Ankle eversion      (Blank rows = not tested)  LUMBAR SPECIAL TESTS:  Slump test: Negative  FUNCTIONAL TESTS:  5 times sit to stand: 24.16 seated on scooter Timed up and go (TUG): 20.90 from seated on scooter Berg: 35/56  06/21/22:  5x STS:  18.40 seated on scooter    TUG 12.91 from seated on scooter  06/28/22: Sharlene Motts: 40/56  GAIT: Distance walked: 30 ft Assistive device utilized: None Level of assistance:  none Comments: shoulder retraction, antalgic gait pattern  TODAY'S TREATMENT:                                                                                                                               Pt seen for aquatic therapy today.  Treatment took place in water 3.5-4.75 ft in depth at the [facility] pool. Temp of water was 91.  Pt entered/exited the pool via stairs independently with bilat rail.  * without support:  walking forward/ backward  * side stepping with shoulder addct with rainbow -> yellow hand floats x 3 laps * holding yellow hand floats under water at sides, walking forward/ backward * TrA set with thin squoodle pull down x 12 * back against wall with squoodle stomp x 10 each LE * holding wall:  hip abdct/ addct x 10 each LE; leg swings into hip flex/ext x 10 each LE;  heel raises x 10 * light jog forward/ backward * holding wall: LE circles CW/CCW x 5 each direction, each LE *  grapevine L/ R  x 2 laps * staggered stance with kick board  row x 20 each LE forward  * STS at bench in water with feet on blue step, cues for hip hinge and forward arm reach x 10 Pt requires the buoyancy and hydrostatic pressure of water for support, and to offload joints by unweighting joint load by at least 50 % in navel deep water and by at least 75-80% in chest to neck deep water.  Viscosity of the water is needed for resistance of strengthening. Water current perturbations provides challenge to standing balance requiring increased core activation.     PATIENT EDUCATION:  Education details: Aquatic therapy progressions/ modifications   --given list of pools hand out  Person educated: Patient Education method: Explanation Education comprehension: verbalized understanding  HOME EXERCISE PROGRAM: Pt will climb stairs 1 -2 x weekly 06/28/22  ASSESSMENT:  CLINICAL IMPRESSION: Pt tolerated increased resistance (with floatation device) well, without report of increased pain.  She requires cues for form on some of exercises, ie: STS, but otherwise is doing well in the  water.  Continued discussion with her on how she will complete aquatic exercises at d/c.  She will continue to benefit from skilled physical therapy intervention to improve her safety with mobility and maximize function. Will plan on adding land based intervention in the next few weeks when tolerated.  OBJECTIVE IMPAIRMENTS: decreased activity tolerance, decreased balance, decreased endurance, decreased knowledge of use of DME, decreased mobility, difficulty walking, decreased strength, impaired sensation, postural dysfunction, obesity, and pain.   ACTIVITY LIMITATIONS: carrying, lifting, bending, sitting, standing, squatting, stairs, transfers, bed mobility, locomotion level, and caring for others  PARTICIPATION LIMITATIONS: meal prep, cleaning, laundry, shopping, community activity, and occupation  PERSONAL FACTORS: Age, Behavior pattern, Fitness, Time since onset of injury/illness/exacerbation, and 1-2 comorbidities: cardiomyopathy, Oa knees and back, depression/anxiety  are also affecting patient's functional outcome.   REHAB POTENTIAL: Good  CLINICAL DECISION MAKING: Evolving/moderate complexity  EVALUATION COMPLEXITY: Moderate   GOALS: Goals reviewed with patient? Yes  SHORT TERM GOALS: Target date: 06/24/22  Pt will tolerate full aquatic sessions consistently without increase in pain and with improving function to demonstrate good toleration and effectiveness of intervention.   Baseline:TBA Goal status:MET - 06/14/22  2.  Pt will climb stairs using combination of step to and alternating pattern without pausing consistently Baseline: improving - some pauses, but not consistent - 06/19/22 Goal status:ONGOING   3.  Pt will report climbing stairs at home 1-2 x weekly to access bedroom and full bath more regularly. Baseline: <1x weekly Pt reports she has been showering at therapy and has not been climbing stair Goal status: INITIAL  4.  Pt will tolerate walking 8-10 continuous  minutes in pool to demonstrate improving toleration to activity Baseline: na Goal status: MET - 06/14/22  5.  Pt will improve on 5 X STS test to <or=   18s to demonstrate improving functional lower extremity strength, transitional movements, and balance Baseline: 24.16 at eval;  18.4s on 06/21/22 Goal status: PARTIALLY MET   6.  Pt will improve on Tug test to <or= 14s to demonstrate improvement in lower extremity function, mobility and decreased fall risk.  Baseline:  20.90 at eval;  12.91 on 06/21/22 Goal status: MET   LONG TERM GOALS: Target date: 07/19/22  Pt to meet stated Foto Goal of 39% to demonstrate improved perception of ability Baseline: 22% Goal status: Ongoing 06/28/22  2.  Pt will be indep with final HEP's (land and aquatic as appropriate) for continued management of condition  Baseline:  Goal  status: INITIAL  3.  Pt will walk in store for up to or >15 mins pushing cart Baseline: rides scooter Goal status: INITIAL  4.  Pt will report decrease in pain with amb to <5/10 to demonstrate improvement management of condition Baseline: 9/10 Goal status: ongoing  06/28/22  5.  Pt will improve on Berg balance test to >/= 50/56 to demonstrate a decrease in fall risk.  Baseline: 35/56 Goal status: in progress 06/28/22 (40/56)  6.  Pt will increase strength of hip by 1 full grade to improve functional mobility. Baseline: see chart Goal status: INITIAL  PLAN:  PT FREQUENCY: 1-2x/week  PT DURATION: 10 weeks skipping 1st 2 weeks to address medical issue  PLANNED INTERVENTIONS: Therapeutic exercises, Therapeutic activity, Neuromuscular re-education, Balance training, Gait training, Patient/Family education, Self Care, Joint mobilization, Stair training, DME instructions, Aquatic Therapy, Dry Needling, Cryotherapy, Moist heat, Taping, Ultrasound, Biofeedback, Ionotophoresis 4mg /ml Dexamethasone, Manual therapy, and Re-evaluation.  PLAN FOR NEXT SESSION: aquatics for general  strengthening, gait and balance retraining, proprioceptive retraining, toleration to activity/aerobic capacity, and pain reduction Mayer Camel, PTA 07/03/22 1:02 PM [facility] [address] [place], [place], [address] Phone: [phone]   Fax:  [phone]

## 2022-07-05 ENCOUNTER — Encounter (HOSPITAL_BASED_OUTPATIENT_CLINIC_OR_DEPARTMENT_OTHER): Payer: Self-pay | Admitting: Physical Therapy

## 2022-07-05 ENCOUNTER — Ambulatory Visit (HOSPITAL_BASED_OUTPATIENT_CLINIC_OR_DEPARTMENT_OTHER): Payer: Medicare Other | Admitting: Physical Therapy

## 2022-07-05 DIAGNOSIS — M545 Low back pain, unspecified: Secondary | ICD-10-CM | POA: Diagnosis not present

## 2022-07-05 DIAGNOSIS — M6281 Muscle weakness (generalized): Secondary | ICD-10-CM

## 2022-07-05 DIAGNOSIS — M5459 Other low back pain: Secondary | ICD-10-CM

## 2022-07-05 DIAGNOSIS — G8929 Other chronic pain: Secondary | ICD-10-CM | POA: Diagnosis not present

## 2022-07-05 DIAGNOSIS — M25561 Pain in right knee: Secondary | ICD-10-CM | POA: Diagnosis not present

## 2022-07-05 NOTE — Therapy (Signed)
OUTPATIENT PHYSICAL THERAPY THORACOLUMBAR TREATMENT    Patient Name: [name] MRN: [id number] DOB:09-Aug-1965, 57 y.o., female Today's Date: 07/05/2022  END OF SESSION:  PT End of Session - 07/05/22 1118     Visit Number 11    Number of Visits 18    Date for PT Re-Evaluation 07/19/22    Authorization Type MCR    Progress Note Due on Visit 19    PT Start Time 1115    PT Stop Time 1200    PT Time Calculation (min) 45 min    Activity Tolerance Patient tolerated treatment well    Behavior During Therapy WFL for tasks assessed/performed             Past Medical History:  Diagnosis Date   ADD (attention deficit disorder)    Anxiety    Arthritis    both knees right worse than left   Carpal tunnel syndrome of right wrist    Depression    Essential hypertension, benign    Gallstones    History of smoking 06/22/2016   Hyperlipidemia associated with type 2 diabetes mellitus (HCC), on Zocor 04/04/2011   Hypertension associated with diabetes (HCC) 06/03/2008   Migraines    Morbid obesity (HCC)    Morbid obesity with BMI of 50.0-59.9, adult (HCC) 07/12/2006   NICM (nonischemic cardiomyopathy) (HCC) 07/12/2006   01/16/18 ECHO:    - Procedure narrative: Transthoracic echocardiography. Image   quality was suboptimal. The study was technically difficult.   Intravenous contrast (Definity) was administered. - Left ventricle: The cavity size was moderately dilated. Wall   thickness was increased in a pattern of mild LVH. Systolic   function was moderately to severely reduced. The estimated   ejection fraction w   Normal coronary arteries 04/17/2016   OSA on CPAP 10/28/2014   Spinal stenosis    back pain   Spondylosis without myelopathy or radiculopathy, lumbar region 12/04/2017   Type 2 diabetes mellitus with hyperglycemia [facility])    Past Surgical History:  Procedure Laterality Date   CHOLECYSTECTOMY     DILATATION & CURETTAGE/HYSTEROSCOPY WITH MYOSURE N/A 10/31/2017   Procedure: DILATATION &  CURETTAGE/HYSTEROSCOPY WITH MYOSURE;  Surgeon: Romualdo Bolk, MD;  Location: [facility];  Service: Gynecology;  Laterality: N/A;   HYSTEROSCOPY WITH D & C N/A 05/07/2022   Procedure: DILATATION AND CURETTAGE /HYSTEROSCOPY;  Surgeon: [name], MD;  Location: [facility] OR;  Service: Gynecology;  Laterality: N/A;   RIGHT/LEFT HEART CATH AND CORONARY ANGIOGRAPHY N/A 04/11/2016   Procedure: Right/Left Heart Cath and Coronary Angiography;  Surgeon: Kathleene Hazel, MD;  Location: [facility] INVASIVE CV LAB;  Service: Cardiovascular;  Laterality: N/A;   sonogram for blood clots     no blockages   Patient Active Problem List   Diagnosis Date Noted   PMB (postmenopausal bleeding) 07/03/2022   Postmenopausal bleeding 05/07/2022   Fear of other medical care 05/06/2022   Primary osteoarthritis of both knees 03/06/2022   Bilateral ovarian cysts 03/06/2022   Major depressive disorder, recurrent episode, severe with anxious distress (HCC) 08/09/2021   Attention deficit hyperactivity disorder (ADHD), predominantly inattentive type 08/09/2021   Need for varicella vaccine 08/09/2021   Urge incontinence 08/01/2021   Tinea corporis 03/14/2021   Hyperlipidemia LDL goal <100 03/01/2021   DDD (degenerative disc disease), lumbar 08/08/2020   Non-seasonal allergic rhinitis due to pollen 07/25/2020   Chronic idiopathic constipation 07/25/2020   Visit for screening mammogram 02/11/2020   Chronic bilateral low back pain without sciatica 02/10/2020  Spondylosis without myelopathy or radiculopathy, lumbar region 12/04/2017   Type 2 diabetes mellitus with hyperglycemia (HCC)    Normal coronary arteries 04/17/2016   OSA on CPAP 10/28/2014   Major depressive disorder 10/28/2014   Chronic systolic CHF (congestive heart failure), NYHA class 3 (HCC) 12/06/2013   Hyperlipidemia associated with type 2 diabetes mellitus (HCC), on Zocor 04/04/2011   Abnormal electrocardiography 01/16/2009   Hypertension associated  with diabetes (HCC) 06/03/2008   Morbid obesity with BMI of 50.0-59.9, adult (HCC) 07/12/2006   NICM (nonischemic cardiomyopathy) (HCC) 07/12/2006   Attention deficit disorder 07/12/2006   Migraine without status migrainosus, not intractable 07/12/2006    PCP: Samantha Grandchild, MD   REFERRING PROVIDER: Rodolph Bong, MD   REFERRING DIAG:   Chronic pain of right knee  M54.50,G89.29 (ICD-10-CM) - Chronic bilateral low back pain without sciatica    Rationale for Evaluation and Treatment: Rehabilitation  THERAPY DIAG:  Other low back pain  Chronic pain of right knee  Muscle weakness (generalized)  Chronic bilateral low back pain without sciatica  ONSET DATE: >5  SUBJECTIVE:                                                                                                                                                                                           SUBJECTIVE STATEMENT: Pt reports decreased pain after session but return by nighttime.  PERTINENT HISTORY:  Samantha Saucier MD 03/19/22: 57 y.o. female with chronic low back pain and chronic right knee pain  . . . bothersome paresthesias sensation bilateral lower extremities.  . . . Right knee pain due to exacerbation of DJD.  . . . Chronic back pain thought to be due to core weakness and facet arthritis changes seen on CT scan.   PAIN:  Are you having pain? Yes: NPRS scale: 6/10 Pain location: LB to bilat hips Pain description: ache, pressure Aggravating factors: standing and moving, walking through home Relieving factors: sitting  PRECAUTIONS: Knee and Back  WEIGHT BEARING RESTRICTIONS: No  FALLS:  Has patient fallen in last 6 months? No  LIVING ENVIRONMENT: Lives with: lives with their family and lives with their spouse Lives in: House/apartment Stairs: Yes: Internal: 16 steps; on right going up Has following equipment at home: Walker - 4 wheeled and shower chair  OCCUPATION: disabled  PLOF: Needs assistance  with ADLs, Needs assistance with homemaking, and Needs assistance with gait  PATIENT GOALS: increased movement without pain, ADL's, cook, climb stairs, go to the store without hurting  NEXT MD VISIT: 6 weeks  OBJECTIVE:   DIAGNOSTIC FINDINGS:  X-ray images right knee: Mild medial compartment DJD.  Mild patellofemoral DJD  CT scan images of lumbar spine: Diffuse degenerative changes present throughout lumbar spine   PATIENT SURVEYS:  FOTO Primary score 22% with goal 39% 06/28/22: 22%  SCREENING FOR RED FLAGS: none  COGNITION: Overall cognitive status: Within functional limits for tasks assessed     SENSATION: wfl  POSTURE: increased thoracic kyphosis,right shoulder elevated  PALPATION: No tenderness although difficulty to assess due to body habitus  LUMBAR ROM:   AROM eval 06/28/22  Flexion Mid shin   Extension 0 10d  Right lateral flexion 50% 75%  Left lateral flexion 50% 75%  Right rotation 100%   Left rotation 100%    (Blank rows = not tested)  LOWER EXTREMITY ROM:      Limited due to body habitus  LOWER EXTREMITY MMT:    MMT Right eval Left eval 06/28/22 Right / left  Hip flexion 3+ 3+ 4-/5  Hip extension     Hip abduction     Hip adduction     Hip internal rotation     Hip external rotation     Knee flexion 4- 4- 4+ / 4+  Knee extension 4+ 4+ 5- / 5-  Ankle dorsiflexion     Ankle plantarflexion     Ankle inversion     Ankle eversion      (Blank rows = not tested)  LUMBAR SPECIAL TESTS:  Slump test: Negative  FUNCTIONAL TESTS:  5 times sit to stand: 24.16 seated on scooter Timed up and go (TUG): 20.90 from seated on scooter Berg: 35/56  06/21/22:  5x STS:  18.40 seated on scooter    TUG 12.91 from seated on scooter  06/28/22: Sharlene Motts: 40/56  GAIT: Distance walked: 30 ft Assistive device utilized: None Level of assistance:  none Comments: shoulder retraction, antalgic gait pattern  TODAY'S TREATMENT:                                                                                                                               Pt seen for aquatic therapy today.  Treatment took place in water 3.5-4.75 ft in depth at the [facility] pool. Temp of water was 91.  Pt entered/exited the pool via stairs independently with bilat rail.  3.82ft * without support:  walking forward/ backward  *side lunge ue add/abd with rainbow HB x 4 widths (difficulty with coordination) * holding rainbow hand floats under water at sides, walking forward/ backward * TrA set with thin squoodle pull down x 12 wide stance then staggered *  grapevine L/ R  x 4 widths   4.79ft-4.8 ft *L stretch using ladder and hand rail * holding wall:  hip abdct/ addct x 12 each LE; leg swings into hip flex/ext x 12 each LE;  heel raises x 12 * holding wall: LE circles CW/CCW x 10 each direction, each LE *yellow noodle support under arms for decompression then straddled with cycling for recovery   Pt requires  the buoyancy and hydrostatic pressure of water for support, and to offload joints by unweighting joint load by at least 50 % in navel deep water and by at least 75-80% in chest to neck deep water.  Viscosity of the water is needed for resistance of strengthening. Water current perturbations provides challenge to standing balance requiring increased core activation.     PATIENT EDUCATION:  Education details: Aquatic therapy progressions/ modifications   --given list of pools hand out  Person educated: Patient Education method: Explanation Education comprehension: verbalized understanding  HOME EXERCISE PROGRAM: Pt will climb stairs 1 -2 x weekly 06/28/22  ASSESSMENT:  CLINICAL IMPRESSION: Pt edu on proper use of pain scale, given face scale. She was better able to communicate actual pain level 6/10 (rather than her perceived constant 8-10/10 usually reported). Pt completed  first exercises in less submerged area for slight increase in loading for strengthening.   She does  report a slight increase in LBP which reduces with deeper submersion for remainder of exercises.  Goals ongoing  Pt tolerated increased resistance (with floatation device) well, without report of increased pain.    OBJECTIVE IMPAIRMENTS: decreased activity tolerance, decreased balance, decreased endurance, decreased knowledge of use of DME, decreased mobility, difficulty walking, decreased strength, impaired sensation, postural dysfunction, obesity, and pain.   ACTIVITY LIMITATIONS: carrying, lifting, bending, sitting, standing, squatting, stairs, transfers, bed mobility, locomotion level, and caring for others  PARTICIPATION LIMITATIONS: meal prep, cleaning, laundry, shopping, community activity, and occupation  PERSONAL FACTORS: Age, Behavior pattern, Fitness, Time since onset of injury/illness/exacerbation, and 1-2 comorbidities: cardiomyopathy, Oa knees and back, depression/anxiety  are also affecting patient's functional outcome.   REHAB POTENTIAL: Good  CLINICAL DECISION MAKING: Evolving/moderate complexity  EVALUATION COMPLEXITY: Moderate   GOALS: Goals reviewed with patient? Yes  SHORT TERM GOALS: Target date: 06/24/22  Pt will tolerate full aquatic sessions consistently without increase in pain and with improving function to demonstrate good toleration and effectiveness of intervention.   Baseline:TBA Goal status:MET - 06/14/22  2.  Pt will climb stairs using combination of step to and alternating pattern without pausing consistently Baseline: improving - some pauses, but not consistent - 06/19/22 Goal status:ONGOING   3.  Pt will report climbing stairs at home 1-2 x weekly to access bedroom and full bath more regularly. Baseline: <1x weekly Pt reports she has been showering at therapy and has not been climbing stair Goal status: INITIAL  4.  Pt will tolerate walking 8-10 continuous minutes in pool to demonstrate improving toleration to activity Baseline:  na Goal status: MET - 06/14/22  5.  Pt will improve on 5 X STS test to <or=   18s to demonstrate improving functional lower extremity strength, transitional movements, and balance Baseline: 24.16 at eval;  18.4s on 06/21/22 Goal status: PARTIALLY MET   6.  Pt will improve on Tug test to <or= 14s to demonstrate improvement in lower extremity function, mobility and decreased fall risk.  Baseline:  20.90 at eval;  12.91 on 06/21/22 Goal status: MET   LONG TERM GOALS: Target date: 07/19/22  Pt to meet stated Foto Goal of 39% to demonstrate improved perception of ability Baseline: 22% Goal status: Ongoing 06/28/22  2.  Pt will be indep with final HEP's (land and aquatic as appropriate) for continued management of condition  Baseline:  Goal status: INITIAL  3.  Pt will walk in store for up to or >15 mins pushing cart Baseline: rides scooter Goal status: INITIAL  4.  Pt  will report decrease in pain with amb to <5/10 to demonstrate improvement management of condition Baseline: 9/10 Goal status: ongoing  06/28/22  5.  Pt will improve on Berg balance test to >/= 50/56 to demonstrate a decrease in fall risk.  Baseline: 35/56 Goal status: in progress 06/28/22 (40/56)  6.  Pt will increase strength of hip by 1 full grade to improve functional mobility. Baseline: see chart Goal status: INITIAL  PLAN:  PT FREQUENCY: 1-2x/week  PT DURATION: 10 weeks skipping 1st 2 weeks to address medical issue  PLANNED INTERVENTIONS: Therapeutic exercises, Therapeutic activity, Neuromuscular re-education, Balance training, Gait training, Patient/Family education, Self Care, Joint mobilization, Stair training, DME instructions, Aquatic Therapy, Dry Needling, Cryotherapy, Moist heat, Taping, Ultrasound, Biofeedback, Ionotophoresis 4mg /ml Dexamethasone, Manual therapy, and Re-evaluation.  PLAN FOR NEXT SESSION: aquatics for general strengthening, gait and balance retraining, proprioceptive retraining, toleration to  activity/aerobic capacity, and pain reduction   Rushie Chestnut) Madalena Kesecker MPT 07/05/22 11:20 AM [facility] [address] [place], [place], [address] Phone: [phone]   Fax:  [phone]

## 2022-07-08 ENCOUNTER — Ambulatory Visit: Payer: Self-pay | Admitting: Licensed Clinical Social Worker

## 2022-07-08 ENCOUNTER — Other Ambulatory Visit: Payer: Self-pay | Admitting: Internal Medicine

## 2022-07-08 DIAGNOSIS — I428 Other cardiomyopathies: Secondary | ICD-10-CM

## 2022-07-08 DIAGNOSIS — E1165 Type 2 diabetes mellitus with hyperglycemia: Secondary | ICD-10-CM

## 2022-07-08 NOTE — Patient Outreach (Signed)
  Care Coordination  Follow Up Visit Note   07/08/2022 Name: [name] MRN: [id number] DOB: [birth year]  [name] is a 57 y.o. year old female who sees [name], MD for primary care. I spoke with  [name] by phone today.  What matters to the patients health and wellness today?  Managing mental and physical health needs  Patient is making progress with pool therapy.  This has also been beneficial for her mental health. Reports she has noticed her ability to stand longer as she is gaining strength. Discussed continued barriers with her care and possible solutions.   Goals Addressed             This Visit's Progress    Care Coordination Activities for mental health / physical health support       Activities in order to accomplish goals.   I am glad we were able to get your appointment scheduled with [facility] [phone]   June 4th at 2:00 with,   [name]    And therapist [name] June 12th at 1:00 Continue with compliance of taking medication prescribed by Doctor and keep all medical appointments Keep medical appointment we reviewed today for physical therapy, You wanted to find out more information about your Medicaid ( per [facility] we will need to talk to your local DSS worker [name]) I left a message with [name] at the [employer]  [phone] for Medicare options( I understand this was overwhelming to you so we can revisit this together with [name] on a 2 way call.)         SDOH assessments and interventions completed:  No   Care Coordination Interventions:  Yes, provided  Interventions Today    Flowsheet Row Most Recent Value  Chronic Disease   Chronic disease during today's visit Hypertension (HTN), Congestive Heart Failure (CHF), Diabetes  General Interventions   General Interventions Discussed/Reviewed General Interventions Reviewed, Communication with  Communication with --  [facility] [name]  [phone]) to explore Managed Medicare]  Exercise Interventions   Exercise Discussed/Reviewed Exercise Reviewed, Physical Activity  [pool therapy and exploring [employer]]  Mental Health Interventions   Mental Health Discussed/Reviewed Mental Health Reviewed, Anxiety, Depression, Coping Strategies  [active listen, emotional support, problem solving]       Follow up plan: Follow up call scheduled for 5 weeks and sooner once return call is received from the [facility]    Encounter Outcome:  Pt. Visit Completed   [name], LCSW Social Work Care Coordination  [facility] [employer] [phone]

## 2022-07-08 NOTE — Patient Instructions (Signed)
Visit Information  Thank you for taking time to visit with me today. Please don't hesitate to contact me if I can be of assistance to you.   Following are the goals we discussed today:   Goals Addressed             This Visit's Progress    Care Coordination Activities for mental health / physical health support       Activities in order to accomplish goals.   I am glad we were able to get your appointment scheduled with [facility] [phone]   June 4th at 2:00 with,   Otelia Sergeant    And therapist Genia Del June 12th at 1:00 Continue with compliance of taking medication prescribed by Doctor and keep all medical appointments Keep medical appointment we reviewed today for physical therapy, You wanted to find out more information about your Medicaid ( per [facility] we will need to talk to your local DSS worker [name]) I left a message with [name] at the [employer]  [phone] for Medicare options( I understand this was overwhelming to you so we can revisit this together with [name] on a 2 way call.)          Our next appointment is by telephone on 08/12/22 at 3:00  Please call the care guide team at [phone] if you need to cancel or reschedule your appointment.   The patient verbalized understanding of instructions, educational materials, and care plan provided today and DECLINED offer to receive copy of patient instructions, educational materials, and care plan.   [name], LCSW Social Work Care Coordination  [facility] [employer] [phone]

## 2022-07-09 ENCOUNTER — Ambulatory Visit
Admission: RE | Admit: 2022-07-09 | Discharge: 2022-07-09 | Disposition: A | Discharge status: Home / Self Care | Payer: Medicare Other | Source: Ambulatory Visit | Attending: Internal Medicine | Admitting: Internal Medicine

## 2022-07-09 ENCOUNTER — Ambulatory Visit: Payer: Medicare Other | Admitting: Obstetrics and Gynecology

## 2022-07-09 ENCOUNTER — Other Ambulatory Visit: Payer: Self-pay | Admitting: Internal Medicine

## 2022-07-09 DIAGNOSIS — N6310 Unspecified lump in the right breast, unspecified quadrant: Secondary | ICD-10-CM

## 2022-07-09 DIAGNOSIS — N63 Unspecified lump in unspecified breast: Secondary | ICD-10-CM | POA: Diagnosis not present

## 2022-07-09 DIAGNOSIS — D242 Benign neoplasm of left breast: Secondary | ICD-10-CM | POA: Diagnosis not present

## 2022-07-10 ENCOUNTER — Ambulatory Visit (HOSPITAL_BASED_OUTPATIENT_CLINIC_OR_DEPARTMENT_OTHER): Payer: Medicare Other | Admitting: Physical Therapy

## 2022-07-10 ENCOUNTER — Ambulatory Visit: Payer: Medicare Other | Admitting: Obstetrics and Gynecology

## 2022-07-12 ENCOUNTER — Ambulatory Visit (HOSPITAL_BASED_OUTPATIENT_CLINIC_OR_DEPARTMENT_OTHER): Payer: Medicare Other | Admitting: Physical Therapy

## 2022-07-12 ENCOUNTER — Telehealth: Payer: Self-pay | Admitting: Internal Medicine

## 2022-07-12 ENCOUNTER — Other Ambulatory Visit: Payer: Self-pay | Admitting: Internal Medicine

## 2022-07-12 DIAGNOSIS — U071 COVID-19: Secondary | ICD-10-CM

## 2022-07-12 MED ORDER — NIRMATRELVIR/RITONAVIR (PAXLOVID)TABLET: 3.0000 | ORAL_TABLET | Freq: Two times a day (BID) | ORAL | 0 refills | Status: AC

## 2022-07-12 NOTE — Telephone Encounter (Signed)
Patient tested positive for Covid 07/11/2022. She would like to know what she should do/avoid. Her at home test was expired, but it came up positive very quickly. She would also like to know where she can get more tests for her family. She would also like to know if one of the Covid medications can be prescribed and sent to [id number] - [place], [place] - [address]. Best callback is [phone].

## 2022-07-16 ENCOUNTER — Ambulatory Visit: Payer: Self-pay | Admitting: Licensed Clinical Social Worker

## 2022-07-16 NOTE — Patient Instructions (Signed)
Social Work Visit Information  Thank you for taking time to visit with me today. Please don't hesitate to contact me if I can be of assistance to you.   Following are the goals we discussed today:   Goals Addressed             This Visit's Progress    Care Coordination Activities for mental health / physical health support       Activities in order to accomplish goals.   I am glad we were able to get your appointment scheduled with [facility] [phone]   June 4th at 2:00 with,   Otelia Sergeant    And therapist Genia Del June 12th at 1:00 Continue with compliance of taking medication prescribed by Doctor and keep all medical appointments Keep medical appointment we reviewed today for physical therapy, You wanted to find out more information about your Medicaid ( per [facility] we will need to talk to your local DSS worker [name]) I spoke with [name] at the [employer]  [phone] for Medicare options.  At this time [name] is the only one that has all of your providers in network this includes $320 for flex dollars for Benefit card; , dental ,  0 doctor visit, 0 for therapy ; Silver Sneakers , foot care   purse devise with life station ( "fallen and can't get up").        No follow up scheduled with social work at this time. Will follow up in 1 week .Patient will call office if needed prior to next encounter.  Please call the care guide team at [phone] if you need to cancel or reschedule your appointment.   If you or anyone you know are experiencing a Mental Health or Behavioral Health Crisis or need someone to talk to, please call the Suicide and Crisis Lifeline: 988 call the [place] National Suicide Prevention Lifeline: (216) 339-6661 or TTY: 6075939711 TTY 6157968814) to talk to a trained counselor call 1-800-273-TALK (toll free, 24 hour hotline) go to [facility] [address], [place] [phone])    The patient verbalized understanding of instructions, educational materials, and care plan provided today and DECLINED offer to receive copy of patient instructions, educational materials, and care plan.

## 2022-07-16 NOTE — Patient Outreach (Signed)
  Care Coordination  Follow Up Visit Note   07/16/2022 Name: Amar Groshong MRN: [id number] DOB: [birth year]  Mazie Lasseter is a 57 y.o. year old female who sees [name], MD for primary care. I spoke with  [name] by phone today.  What matters to the patients health and wellness today?  Not feeling well and unable to complete phone appointment today,  LCSW was able to collaborate with [name] at the insurance [employer] to gather answers to questions for patient.   Goals Addressed             This Visit's Progress    Care Coordination Activities for mental health / physical health support       Activities in order to accomplish goals.   I am glad we were able to get your appointment scheduled with [facility] [phone]   June 4th at 2:00 with,   Otelia Sergeant    And therapist Genia Del June 12th at 1:00 Continue with compliance of taking medication prescribed by Doctor and keep all medical appointments Keep medical appointment we reviewed today for physical therapy, You wanted to find out more information about your Medicaid ( per [facility] we will need to talk to your local DSS worker [name]) I spoke with [name] at the [employer]  [phone] for Medicare options.  At this time [name] is the only one that has all of your providers in network this includes $320 for flex dollars for Benefit card; , dental ,  0 doctor visit, 0 for therapy ; Silver Sneakers , foot care   purse devise with life station ( "fallen and can't get up").       SDOH assessments and interventions completed:  No  Care Coordination Interventions:  Yes, provided  Interventions Today    Flowsheet Row Most Recent Value  Chronic Disease   Chronic disease during today's visit Hypertension (HTN), Diabetes, Congestive Heart Failure (CHF)  General Interventions   General Interventions Discussed/Reviewed Communication with, General Interventions Reviewed  Communication with --   [Health insurance sho[employer]]  Education Interventions   Provided Verbal Education On Development worker, community  Mental Health Interventions   Mental Health Discussed/Reviewed Mental Health Discussed, Mental Health Reviewed, Anxiety, Depression, Coping Strategies       Follow up plan:  Patient would like LCSW to call back one day next week to reschedule when she is feeling better    Encounter Outcome:  Pt. Visit Completed   [name], LCSW Social Work Care Coordination  [facility] [employer] [phone]

## 2022-07-17 ENCOUNTER — Encounter (HOSPITAL_BASED_OUTPATIENT_CLINIC_OR_DEPARTMENT_OTHER): Payer: Self-pay | Admitting: Physical Therapy

## 2022-07-17 ENCOUNTER — Ambulatory Visit (HOSPITAL_BASED_OUTPATIENT_CLINIC_OR_DEPARTMENT_OTHER): Payer: Medicare Other | Admitting: Physical Therapy

## 2022-07-17 DIAGNOSIS — G8929 Other chronic pain: Secondary | ICD-10-CM

## 2022-07-17 DIAGNOSIS — M545 Low back pain, unspecified: Secondary | ICD-10-CM | POA: Diagnosis not present

## 2022-07-17 DIAGNOSIS — M6281 Muscle weakness (generalized): Secondary | ICD-10-CM

## 2022-07-17 DIAGNOSIS — M5459 Other low back pain: Secondary | ICD-10-CM | POA: Diagnosis not present

## 2022-07-17 DIAGNOSIS — M25561 Pain in right knee: Secondary | ICD-10-CM | POA: Diagnosis not present

## 2022-07-17 NOTE — Therapy (Signed)
OUTPATIENT PHYSICAL THERAPY THORACOLUMBAR TREATMENT    Patient Name: [name] MRN: [id number] DOB:02/25/1966, 57 y.o., female Today's Date: 07/17/2022  END OF SESSION:  PT End of Session - 07/17/22 1123     Visit Number 12    Number of Visits 18    Date for PT Re-Evaluation 07/19/22    Authorization Type MCR    Progress Note Due on Visit 19    PT Start Time 1118    PT Stop Time 1158    PT Time Calculation (min) 40 min    Activity Tolerance Patient tolerated treatment well    Behavior During Therapy WFL for tasks assessed/performed             Past Medical History:  Diagnosis Date   ADD (attention deficit disorder)    Anxiety    Arthritis    both knees right worse than left   Carpal tunnel syndrome of right wrist    Depression    Essential hypertension, benign    Gallstones    History of smoking 06/22/2016   Hyperlipidemia associated with type 2 diabetes mellitus (HCC), on Zocor 04/04/2011   Hypertension associated with diabetes (HCC) 06/03/2008   Migraines    Morbid obesity (HCC)    Morbid obesity with BMI of 50.0-59.9, adult (HCC) 07/12/2006   NICM (nonischemic cardiomyopathy) (HCC) 07/12/2006   01/16/18 ECHO:    - Procedure narrative: Transthoracic echocardiography. Image   quality was suboptimal. The study was technically difficult.   Intravenous contrast (Definity) was administered. - Left ventricle: The cavity size was moderately dilated. Wall   thickness was increased in a pattern of mild LVH. Systolic   function was moderately to severely reduced. The estimated   ejection fraction w   Normal coronary arteries 04/17/2016   OSA on CPAP 10/28/2014   Spinal stenosis    back pain   Spondylosis without myelopathy or radiculopathy, lumbar region 12/04/2017   Type 2 diabetes mellitus with hyperglycemia [facility])    Past Surgical History:  Procedure Laterality Date   CHOLECYSTECTOMY     DILATATION & CURETTAGE/HYSTEROSCOPY WITH MYOSURE N/A 10/31/2017   Procedure: DILATATION &  CURETTAGE/HYSTEROSCOPY WITH MYOSURE;  Surgeon: Romualdo Bolk, MD;  Location: [facility];  Service: Gynecology;  Laterality: N/A;   HYSTEROSCOPY WITH D & C N/A 05/07/2022   Procedure: DILATATION AND CURETTAGE /HYSTEROSCOPY;  Surgeon: [name], MD;  Location: [facility] OR;  Service: Gynecology;  Laterality: N/A;   RIGHT/LEFT HEART CATH AND CORONARY ANGIOGRAPHY N/A 04/11/2016   Procedure: Right/Left Heart Cath and Coronary Angiography;  Surgeon: Kathleene Hazel, MD;  Location: [facility];  Service: Cardiovascular;  Laterality: N/A;   sonogram for blood clots     no blockages   Patient Active Problem List   Diagnosis Date Noted   PMB (postmenopausal bleeding) 07/03/2022   Postmenopausal bleeding 05/07/2022   Fear of other medical care 05/06/2022   Primary osteoarthritis of both knees 03/06/2022   Bilateral ovarian cysts 03/06/2022   Major depressive disorder, recurrent episode, severe with anxious distress (HCC) 08/09/2021   Attention deficit hyperactivity disorder (ADHD), predominantly inattentive type 08/09/2021   Need for varicella vaccine 08/09/2021   Urge incontinence 08/01/2021   Tinea corporis 03/14/2021   Hyperlipidemia LDL goal <100 03/01/2021   DDD (degenerative disc disease), lumbar 08/08/2020   Non-seasonal allergic rhinitis due to pollen 07/25/2020   Chronic idiopathic constipation 07/25/2020   Visit for screening mammogram 02/11/2020   Chronic bilateral low back pain without sciatica 02/10/2020  Spondylosis without myelopathy or radiculopathy, lumbar region 12/04/2017   Type 2 diabetes mellitus with hyperglycemia (HCC)    Normal coronary arteries 04/17/2016   OSA on CPAP 10/28/2014   Major depressive disorder 10/28/2014   Chronic systolic CHF (congestive heart failure), NYHA class 3 (HCC) 12/06/2013   Hyperlipidemia associated with type 2 diabetes mellitus (HCC), on Zocor 04/04/2011   Abnormal electrocardiography 01/16/2009   Hypertension associated  with diabetes (HCC) 06/03/2008   Morbid obesity with BMI of 50.0-59.9, adult (HCC) 07/12/2006   NICM (nonischemic cardiomyopathy) (HCC) 07/12/2006   Attention deficit disorder 07/12/2006   Migraine without status migrainosus, not intractable 07/12/2006    PCP: [name], MD   REFERRING PROVIDER: Rodolph Bong, MD   REFERRING DIAG:   Chronic pain of right knee  M54.50,G89.29 (ICD-10-CM) - Chronic bilateral low back pain without sciatica    Rationale for Evaluation and Treatment: Rehabilitation  THERAPY DIAG:  Other low back pain  Chronic pain of right knee  Muscle weakness (generalized)  ONSET DATE: >5  SUBJECTIVE:                                                                                                                                                                                           SUBJECTIVE STATEMENT: Pt reports she has been away due to illness with covid.  She reports her back is sore from laying around while sick.  "I fell beat up" She states her family encouraged her to come to appt today.   PERTINENT HISTORY:  [name] 03/19/22: 57 y.o. female with chronic low back pain and chronic right knee pain  . . . bothersome paresthesias sensation bilateral lower extremities.  . . . Right knee pain due to exacerbation of DJD.  . . . Chronic back pain thought to be due to core weakness and facet arthritis changes seen on CT scan.   PAIN:  Are you having pain? Yes: NPRS scale: 9/10 Pain location: LB to bilat hips Pain description: ache, pressure Aggravating factors: standing and moving, walking through home Relieving factors: sitting  PRECAUTIONS: Knee and Back  WEIGHT BEARING RESTRICTIONS: No  FALLS:  Has patient fallen in last 6 months? No  LIVING ENVIRONMENT: Lives with: lives with their family and lives with their spouse Lives in: House/apartment Stairs: Yes: Internal: 16 steps; on right going up Has following equipment at home: Walker -  4 wheeled and shower chair  OCCUPATION: disabled  PLOF: Needs assistance with ADLs, Needs assistance with homemaking, and Needs assistance with gait  PATIENT GOALS: increased movement without pain, ADL's, cook, climb stairs, go to the store without  hurting  NEXT MD VISIT: 6 weeks  OBJECTIVE:   DIAGNOSTIC FINDINGS:  X-ray images right knee: Mild medial compartment DJD.  Mild patellofemoral DJD  CT scan images of lumbar spine: Diffuse degenerative changes present throughout lumbar spine   PATIENT SURVEYS:  FOTO Primary score 22% with goal 39% 06/28/22: 22%  SCREENING FOR RED FLAGS: none  COGNITION: Overall cognitive status: Within functional limits for tasks assessed     SENSATION: wfl  POSTURE: increased thoracic kyphosis,right shoulder elevated  PALPATION: No tenderness although difficulty to assess due to body habitus  LUMBAR ROM:   AROM eval 06/28/22  Flexion Mid shin   Extension 0 10d  Right lateral flexion 50% 75%  Left lateral flexion 50% 75%  Right rotation 100%   Left rotation 100%    (Blank rows = not tested)  LOWER EXTREMITY ROM:      Limited due to body habitus  LOWER EXTREMITY MMT:    MMT Right eval Left eval 06/28/22 Right / left  Hip flexion 3+ 3+ 4-/5  Hip extension     Hip abduction     Hip adduction     Hip internal rotation     Hip external rotation     Knee flexion 4- 4- 4+ / 4+  Knee extension 4+ 4+ 5- / 5-  Ankle dorsiflexion     Ankle plantarflexion     Ankle inversion     Ankle eversion      (Blank rows = not tested)  LUMBAR SPECIAL TESTS:  Slump test: Negative  FUNCTIONAL TESTS:  5 times sit to stand: 24.16 seated on scooter Timed up and go (TUG): 20.90 from seated on scooter Berg: 35/56  06/21/22:  5x STS:  18.40 seated on scooter    TUG 12.91 from seated on scooter  06/28/22: Sharlene Motts: 40/56  GAIT: Distance walked: 30 ft Assistive device utilized: None Level of assistance:  none Comments: shoulder retraction, antalgic  gait pattern  TODAY'S TREATMENT:                                                                                                                              Pt seen for aquatic therapy today.  Treatment took place in water 3.5-4.75 ft in depth at the [facility] pool. Temp of water was 91.  Pt entered/exited the pool via stairs independently with bilat rail.  * without support:  walking forward/ backward  *side step with shoulder add/abd with rainbow HB x 2 laps * holding rainbow hand floats under water at sides, marching forward/ backward * vertical squats x 8 * UE on noodle:  3 way LE kick, alternating LEs x 5 each LE * TrA set with thin squoodle pull down x 10 wide stance * straddling yellow noodle:  cycling, suspended jumping jack LEs, cc ski  * back float for decompression   Pt requires the buoyancy and hydrostatic pressure of water for support, and to offload joints by  unweighting joint load by at least 50 % in navel deep water and by at least 75-80% in chest to neck deep water.  Viscosity of the water is needed for resistance of strengthening. Water current perturbations provides challenge to standing balance requiring increased core activation.     PATIENT EDUCATION:  Education details: Aquatic therapy progressions/ modifications   --given list of pools hand out  Person educated: Patient Education method: Explanation Education comprehension: verbalized understanding  HOME EXERCISE PROGRAM: Pt will climb stairs 1 -2 x weekly 06/28/22  ASSESSMENT:  CLINICAL IMPRESSION: Pt has been away from therapy for a week due to illness.  Reviewed previous exercises with good tolerance and report of reduction of back pain when submerged. Pt is progressing gradually towards remaining goals. Pt's next visit is end of plan of care; therapist to assess goals and determine need for additional visits vs d/c to aquatic HEP.      OBJECTIVE IMPAIRMENTS: decreased activity tolerance,  decreased balance, decreased endurance, decreased knowledge of use of DME, decreased mobility, difficulty walking, decreased strength, impaired sensation, postural dysfunction, obesity, and pain.   ACTIVITY LIMITATIONS: carrying, lifting, bending, sitting, standing, squatting, stairs, transfers, bed mobility, locomotion level, and caring for others  PARTICIPATION LIMITATIONS: meal prep, cleaning, laundry, shopping, community activity, and occupation  PERSONAL FACTORS: Age, Behavior pattern, Fitness, Time since onset of injury/illness/exacerbation, and 1-2 comorbidities: cardiomyopathy, Oa knees and back, depression/anxiety  are also affecting patient's functional outcome.   REHAB POTENTIAL: Good  CLINICAL DECISION MAKING: Evolving/moderate complexity  EVALUATION COMPLEXITY: Moderate   GOALS: Goals reviewed with patient? Yes  SHORT TERM GOALS: Target date: 06/24/22  Pt will tolerate full aquatic sessions consistently without increase in pain and with improving function to demonstrate good toleration and effectiveness of intervention.   Baseline:TBA Goal status:MET - 06/14/22  2.  Pt will climb stairs using combination of step to and alternating pattern without pausing consistently Baseline: improving - some pauses, but not consistent - 06/19/22 Goal status:ONGOING   3.  Pt will report climbing stairs at home 1-2 x weekly to access bedroom and full bath more regularly. Baseline: <1x weekly Pt reports she has been showering at therapy and has not been climbing stair Goal status: INITIAL  4.  Pt will tolerate walking 8-10 continuous minutes in pool to demonstrate improving toleration to activity Baseline: na Goal status: MET - 06/14/22  5.  Pt will improve on 5 X STS test to <or=   18s to demonstrate improving functional lower extremity strength, transitional movements, and balance Baseline: 24.16 at eval;  18.4s on 06/21/22 Goal status: PARTIALLY MET   6.  Pt will improve on Tug test  to <or= 14s to demonstrate improvement in lower extremity function, mobility and decreased fall risk.  Baseline:  20.90 at eval;  12.91 on 06/21/22 Goal status: MET   LONG TERM GOALS: Target date: 07/19/22  Pt to meet stated Foto Goal of 39% to demonstrate improved perception of ability Baseline: 22% Goal status: Ongoing 06/28/22  2.  Pt will be indep with final HEP's (land and aquatic as appropriate) for continued management of condition  Baseline:  Goal status: INITIAL  3.  Pt will walk in store for up to or >15 mins pushing cart Baseline: rides scooter Goal status: Ongoing -06/28/22  4.  Pt will report decrease in pain with amb to <5/10 to demonstrate improvement management of condition Baseline: 9/10 Goal status: ongoing  06/28/22  5.  Pt will improve on Berg balance test to >/=  50/56 to demonstrate a decrease in fall risk.  Baseline: 35/56 Goal status: in progress 06/28/22 (40/56)  6.  Pt will increase strength of hip by 1 full grade to improve functional mobility. Baseline: see chart Goal status: INITIAL  PLAN:  PT FREQUENCY: 1-2x/week  PT DURATION: 10 weeks skipping 1st 2 weeks to address medical issue  PLANNED INTERVENTIONS: Therapeutic exercises, Therapeutic activity, Neuromuscular re-education, Balance training, Gait training, Patient/Family education, Self Care, Joint mobilization, Stair training, DME instructions, Aquatic Therapy, Dry Needling, Cryotherapy, Moist heat, Taping, Ultrasound, Biofeedback, Ionotophoresis 4mg /ml Dexamethasone, Manual therapy, and Re-evaluation.  PLAN FOR NEXT SESSION: assess goals for possible recert.   Mayer Camel, PTA 07/17/22 12:05 PM [facility], [facility] [address] [place], [place], [address] Phone: [phone]   Fax:  [phone]

## 2022-07-19 ENCOUNTER — Encounter (HOSPITAL_BASED_OUTPATIENT_CLINIC_OR_DEPARTMENT_OTHER): Payer: Self-pay | Admitting: Physical Therapy

## 2022-07-19 ENCOUNTER — Ambulatory Visit (HOSPITAL_BASED_OUTPATIENT_CLINIC_OR_DEPARTMENT_OTHER): Payer: Medicare Other | Admitting: Physical Therapy

## 2022-07-19 DIAGNOSIS — M5459 Other low back pain: Secondary | ICD-10-CM | POA: Diagnosis not present

## 2022-07-19 DIAGNOSIS — G8929 Other chronic pain: Secondary | ICD-10-CM | POA: Diagnosis not present

## 2022-07-19 DIAGNOSIS — M545 Low back pain, unspecified: Secondary | ICD-10-CM

## 2022-07-19 DIAGNOSIS — M6281 Muscle weakness (generalized): Secondary | ICD-10-CM

## 2022-07-19 DIAGNOSIS — M25561 Pain in right knee: Secondary | ICD-10-CM | POA: Diagnosis not present

## 2022-07-19 NOTE — Therapy (Signed)
OUTPATIENT PHYSICAL THERAPY THORACOLUMBAR TREATMENT PN and Re-cert  Progress Note Reporting Period 05/10/22 to 07/19/22  See note below for Objective Data and Assessment of Progress/Goals.        Patient Name: [name] MRN: [id number] DOB:[birth year], 57 y.o., female Today's Date: 07/19/2022  END OF SESSION:  PT End of Session - 07/19/22 1115     Visit Number 13    Number of Visits 29    Date for PT Re-Evaluation 09/13/22    Authorization Type MCR    Progress Note Due on Visit 19    PT Start Time 1116    PT Stop Time 1200    PT Time Calculation (min) 44 min    Activity Tolerance Patient tolerated treatment well    Behavior During Therapy WFL for tasks assessed/performed             Past Medical History:  Diagnosis Date   ADD (attention deficit disorder)    Anxiety    Arthritis    both knees right worse than left   Carpal tunnel syndrome of right wrist    Depression    Essential hypertension, benign    Gallstones    History of smoking 06/22/2016   Hyperlipidemia associated with type 2 diabetes mellitus (HCC), on Zocor 04/04/2011   Hypertension associated with diabetes (HCC) 06/03/2008   Migraines    Morbid obesity (HCC)    Morbid obesity with BMI of 50.0-59.9, adult (HCC) 07/12/2006   NICM (nonischemic cardiomyopathy) (HCC) 07/12/2006   01/16/18 ECHO:    - Procedure narrative: Transthoracic echocardiography. Image   quality was suboptimal. The study was technically difficult.   Intravenous contrast (Definity) was administered. - Left ventricle: The cavity size was moderately dilated. Wall   thickness was increased in a pattern of mild LVH. Systolic   function was moderately to severely reduced. The estimated   ejection fraction w   Normal coronary arteries 04/17/2016   OSA on CPAP 10/28/2014   Spinal stenosis    back pain   Spondylosis without myelopathy or radiculopathy, lumbar region 12/04/2017   Type 2 diabetes mellitus with hyperglycemia [facility])    Past Surgical  History:  Procedure Laterality Date   CHOLECYSTECTOMY     DILATATION & CURETTAGE/HYSTEROSCOPY WITH MYOSURE N/A 10/31/2017   Procedure: DILATATION & CURETTAGE/HYSTEROSCOPY WITH MYOSURE;  Surgeon: Romualdo Bolk, [name];  Location: [facility];  Service: Gynecology;  Laterality: N/A;   HYSTEROSCOPY WITH D & C N/A 05/07/2022   Procedure: DILATATION AND CURETTAGE /HYSTEROSCOPY;  Surgeon: [name], [name];  Location: [facility] OR;  Service: Gynecology;  Laterality: N/A;   RIGHT/LEFT HEART CATH AND CORONARY ANGIOGRAPHY N/A 04/11/2016   Procedure: Right/Left Heart Cath and Coronary Angiography;  Surgeon: Kathleene Hazel, [name];  Location: [facility] INVASIVE CV LAB;  Service: Cardiovascular;  Laterality: N/A;   sonogram for blood clots     no blockages   Patient Active Problem List   Diagnosis Date Noted   PMB (postmenopausal bleeding) 07/03/2022   Postmenopausal bleeding 05/07/2022   Fear of other medical care 05/06/2022   Primary osteoarthritis of both knees 03/06/2022   Bilateral ovarian cysts 03/06/2022   Major depressive disorder, recurrent episode, severe with anxious distress (HCC) 08/09/2021   Attention deficit hyperactivity disorder (ADHD), predominantly inattentive type 08/09/2021   Need for varicella vaccine 08/09/2021   Urge incontinence 08/01/2021   Tinea corporis 03/14/2021   Hyperlipidemia LDL goal <100 03/01/2021   DDD (degenerative disc disease), lumbar 08/08/2020   Non-seasonal allergic rhinitis due to  pollen 07/25/2020   Chronic idiopathic constipation 07/25/2020   Visit for screening mammogram 02/11/2020   Chronic bilateral low back pain without sciatica 02/10/2020   Spondylosis without myelopathy or radiculopathy, lumbar region 12/04/2017   Type 2 diabetes mellitus with hyperglycemia (HCC)    Normal coronary arteries 04/17/2016   OSA on CPAP 10/28/2014   Major depressive disorder 10/28/2014   Chronic systolic CHF (congestive heart failure), NYHA class 3 (HCC) 12/06/2013    Hyperlipidemia associated with type 2 diabetes mellitus (HCC), on Zocor 04/04/2011   Abnormal electrocardiography 01/16/2009   Hypertension associated with diabetes (HCC) 06/03/2008   Morbid obesity with BMI of 50.0-59.9, adult (HCC) 07/12/2006   NICM (nonischemic cardiomyopathy) (HCC) 07/12/2006   Attention deficit disorder 07/12/2006   Migraine without status migrainosus, not intractable 07/12/2006    PCP: [name], [name]   REFERRING PROVIDER: Rodolph Bong, [name]   REFERRING DIAG:   Chronic pain of right knee  M54.50,G89.29 (ICD-10-CM) - Chronic bilateral low back pain without sciatica    Rationale for Evaluation and Treatment: Rehabilitation  THERAPY DIAG:  Other low back pain  Chronic pain of right knee  Muscle weakness (generalized)  Chronic bilateral low back pain without sciatica  ONSET DATE: >5  SUBJECTIVE:                                                                                                                                                                                           SUBJECTIVE STATEMENT: P t reports right LE is more flexible than right but right knee hurts more She states her family encouraged her to come to appt today.   PERTINENT HISTORY:  [name] 03/19/22: 57 y.o. female with chronic low back pain and chronic right knee pain  . . . bothersome paresthesias sensation bilateral lower extremities.  . . . Right knee pain due to exacerbation of DJD.  . . . Chronic back pain thought to be due to core weakness and facet arthritis changes seen on CT scan.   PAIN:  Are you having pain? Yes: NPRS scale: 8/10 Pain location: LB to bilat hips Pain description: ache, pressure Aggravating factors: standing and moving, walking through home Relieving factors: sitting  PRECAUTIONS: Knee and Back  WEIGHT BEARING RESTRICTIONS: No  FALLS:  Has patient fallen in last 6 months? No  LIVING ENVIRONMENT: Lives with: lives with their family and  lives with their spouse Lives in: House/apartment Stairs: Yes: Internal: 16 steps; on right going up Has following equipment at home: Dan Humphreys - 4 wheeled and shower chair  OCCUPATION: disabled  PLOF: Needs assistance with ADLs, Needs assistance with  homemaking, and Needs assistance with gait  PATIENT GOALS: increased movement without pain, ADL's, cook, climb stairs, go to the store without hurting  NEXT [name] VISIT: 6 weeks  OBJECTIVE:   DIAGNOSTIC FINDINGS:  X-ray images right knee: Mild medial compartment DJD.  Mild patellofemoral DJD  CT scan images of lumbar spine: Diffuse degenerative changes present throughout lumbar spine   PATIENT SURVEYS:  FOTO Primary score 22% with goal 39% 06/28/22: 22%  SCREENING FOR RED FLAGS: none  COGNITION: Overall cognitive status: Within functional limits for tasks assessed     SENSATION: wfl  POSTURE: increased thoracic kyphosis,right shoulder elevated  PALPATION: No tenderness although difficulty to assess due to body habitus  LUMBAR ROM:   AROM eval 06/28/22 07/19/22  Flexion Mid shin    Extension 0 10d wfl  Right lateral flexion 50% 75% full  Left lateral flexion 50% 75% full  Right rotation 100%    Left rotation 100%     (Blank rows = not tested)  LOWER EXTREMITY ROM:      Limited due to body habitus  LOWER EXTREMITY MMT:    MMT Right eval Left eval 06/28/22 Right / left 07/19/22 Right / Left  Hip flexion 3+ 3+ 4-/5 35.6 / 41.7  Hip extension      Hip abduction    35.9 / 33/9  Hip adduction      Hip internal rotation      Hip external rotation      Knee flexion 4- 4- 4+ / 4+   Knee extension 4+ 4+ 5- / 5- 25.6 / 30.1  Ankle dorsiflexion      Ankle plantarflexion      Ankle inversion      Ankle eversion       (Blank rows = not tested)  LUMBAR SPECIAL TESTS:  Slump test: Negative  FUNCTIONAL TESTS:  5 times sit to stand: 24.16 seated on scooter Timed up and go (TUG): 20.90 from seated on scooter Berg:  35/56  06/21/22:  5x STS:  18.40 seated on scooter    TUG 12.91 from seated on scooter  06/28/22: Sharlene Motts: 40/56    07/19/22 TUG: 14.85      5 x STS: 17.11  GAIT: Distance walked: 30 ft Assistive device utilized: None Level of assistance:  none Comments: shoulder retraction, antalgic gait pattern  TODAY'S TREATMENT:        Objective testing Re-assessment                                                                                                                         Pt seen for aquatic therapy today.  Treatment took place in water 3.5-4.75 ft in depth at the [facility] pool. Temp of water was 91.  Pt entered/exited the pool via stairs independently with bilat rail.  * without support:  walking forward/ backward  *side step with shoulder add/abd with rainbow HB x 2 laps * holding rainbow hand floats under water at sides,  marching forward/ backward * vertical squats x 8 rainbow HB submerged to sides * UE on yellow hb:  3 way tap, alternating LEs x 10 each LE * TrA set with thin squoodle pull down x 10 wide stance * straddling yellow noodle:  cycling, suspended jumping jack LEs, cc ski  * back float for decompression   Pt requires the buoyancy and hydrostatic pressure of water for support, and to offload joints by unweighting joint load by at least 50 % in navel deep water and by at least 75-80% in chest to neck deep water.  Viscosity of the water is needed for resistance of strengthening. Water current perturbations provides challenge to standing balance requiring increased core activation.     PATIENT EDUCATION:  Education details: Aquatic therapy progressions/ modifications   --given list of pools hand out  Person educated: Patient Education method: Explanation Education comprehension: verbalized understanding  HOME EXERCISE PROGRAM: Pt will climb stairs 1 -2 x weekly 06/28/22  ASSESSMENT:  CLINICAL IMPRESSION: PN: pt reports she had been doing well until   covid. She has  a Research scientist (physical sciences) to [employer] as of yesterday with access to pool. Pt demonstrates improvement in strength, walking speed, and balance as evidenced by objective testing improvements above. Lumbar ROM improved with less pain sensitivity. She will continue to benefit from skilled PT but will begin alternating with land based/aquatics to begin loading for strength and toleration to activity.      OBJECTIVE IMPAIRMENTS: decreased activity tolerance, decreased balance, decreased endurance, decreased knowledge of use of DME, decreased mobility, difficulty walking, decreased strength, impaired sensation, postural dysfunction, obesity, and pain.   ACTIVITY LIMITATIONS: carrying, lifting, bending, sitting, standing, squatting, stairs, transfers, bed mobility, locomotion level, and caring for others  PARTICIPATION LIMITATIONS: meal prep, cleaning, laundry, shopping, community activity, and occupation  PERSONAL FACTORS: Age, Behavior pattern, Fitness, Time since onset of injury/illness/exacerbation, and 1-2 comorbidities: cardiomyopathy, Oa knees and back, depression/anxiety  are also affecting patient's functional outcome.   REHAB POTENTIAL: Good  CLINICAL DECISION MAKING: Evolving/moderate complexity  EVALUATION COMPLEXITY: Moderate   GOALS: Goals reviewed with patient? Yes  SHORT TERM GOALS: Target date: 06/24/22  Pt will tolerate full aquatic sessions consistently without increase in pain and with improving function to demonstrate good toleration and effectiveness of intervention.   Baseline:TBA Goal status:MET - 06/14/22  2.  Pt will climb stairs using combination of step to and alternating pattern without pausing consistently Baseline: improving - some pauses, but not consistent - 06/19/22 Goal status:ONGOING   3.  Pt will report climbing stairs at home 1-2 x weekly to access bedroom and full bath more regularly. Baseline: <1x weekly Pt reports she has been showering at therapy and  has not been climbing stair Goal status: ongoing 07/19/22  4.  Pt will tolerate walking 8-10 continuous minutes in pool to demonstrate improving toleration to activity Baseline: na Goal status: MET - 06/14/22  5.  Pt will improve on 5 X STS test to <or=   18s to demonstrate improving functional lower extremity strength, transitional movements, and balance Baseline: 24.16 at eval;  18.4s on 06/21/22 Goal status: MET 07/19/22 (17.11)  6.  Pt will improve on Tug test to <or= 14s to demonstrate improvement in lower extremity function, mobility and decreased fall risk.  Baseline:  20.90 at eval;  12.91 on 06/21/22 Goal status: MET   LONG TERM GOALS: Target date: 07/19/22  Pt to meet stated Foto Goal of 39% to demonstrate improved perception of ability Baseline: 22%  Goal status: Ongoing 06/28/22  2.  Pt will be indep with final HEP's (land and aquatic as appropriate) for continued management of condition  Baseline:  Goal status: in process 07/19/22  3.  Pt will walk in store for up to or >15 mins pushing cart Baseline: rides scooter Goal status: Ongoing -06/28/22  4.  Pt will report decrease in pain with amb to <5/10 to demonstrate improvement management of condition Baseline: 9/10 Goal status: ongoing  06/28/22  5.  Pt will improve on Berg balance test to >/= 50/56 to demonstrate a decrease in fall risk.  Baseline: 35/56 Goal status: in progress 06/28/22 (40/56)  6.  Pt will increase strength of hip by 1 full grade to improve functional mobility. Baseline: see chart Goal status: deferred (changes measurement tool) 07/19/22  7.Pt will improve strength in all areas listed by 10lb to demonstrate improved overall physical function  Baseline: see chart  Goal Status: NEW   PLAN:  PT FREQUENCY: 1-2x/week  PT DURATION: 8 weeks alternating settings land and aquatics  PLANNED INTERVENTIONS: Therapeutic exercises, Therapeutic activity, Neuromuscular re-education, Balance training, Gait training,  Patient/Family education, Self Care, Joint mobilization, Stair training, DME instructions, Aquatic Therapy, Dry Needling, Cryotherapy, Moist heat, Taping, Ultrasound, Biofeedback, Ionotophoresis 4mg /ml Dexamethasone, Manual therapy, and Re-evaluation.  PLAN FOR NEXT SESSION: Progress strength, ROM, balance, gait and pain reduction land and aquatics.  Consider DN.   [address] [place]) Weda Baumgarner MPT 07/19/22 1:23 PM [facility] [address] [place], [place], [address] Phone: [phone]   Fax:  [phone]

## 2022-07-24 ENCOUNTER — Ambulatory Visit
Admission: RE | Admit: 2022-07-24 | Discharge: 2022-07-24 | Disposition: A | Discharge status: Home / Self Care | Payer: Medicare Other | Source: Ambulatory Visit | Attending: Family Medicine | Admitting: Family Medicine

## 2022-07-24 DIAGNOSIS — M47817 Spondylosis without myelopathy or radiculopathy, lumbosacral region: Secondary | ICD-10-CM | POA: Diagnosis not present

## 2022-07-24 DIAGNOSIS — G8929 Other chronic pain: Secondary | ICD-10-CM

## 2022-07-29 ENCOUNTER — Telehealth: Payer: Self-pay | Admitting: Licensed Clinical Social Worker

## 2022-07-29 NOTE — Patient Outreach (Signed)
  Care Coordination   07/29/2022 Name: [name] MRN: [id number] DOB: [birth year]   Care Coordination Outreach Attempts:  An unsuccessful telephone outreach was attempted today to offer the patient information about available care coordination services.  Follow Up Plan:  Additional outreach attempts will be made to offer the patient care coordination information and services.   Encounter Outcome:  No Answer   Care Coordination Interventions:  No, not indicated     [name], LCSW Social Work Care Coordination  [facility]ie [facility] [employer] [phone]

## 2022-07-30 DIAGNOSIS — F411 Generalized anxiety disorder: Secondary | ICD-10-CM | POA: Diagnosis not present

## 2022-07-30 DIAGNOSIS — F3181 Bipolar II disorder: Secondary | ICD-10-CM | POA: Diagnosis not present

## 2022-07-30 DIAGNOSIS — F909 Attention-deficit hyperactivity disorder, unspecified type: Secondary | ICD-10-CM | POA: Diagnosis not present

## 2022-07-31 ENCOUNTER — Encounter (HOSPITAL_BASED_OUTPATIENT_CLINIC_OR_DEPARTMENT_OTHER): Payer: Self-pay

## 2022-07-31 ENCOUNTER — Ambulatory Visit (HOSPITAL_BASED_OUTPATIENT_CLINIC_OR_DEPARTMENT_OTHER): Payer: Medicare Other | Attending: Family Medicine

## 2022-07-31 ENCOUNTER — Ambulatory Visit: Payer: Self-pay | Admitting: Licensed Clinical Social Worker

## 2022-07-31 DIAGNOSIS — M5459 Other low back pain: Secondary | ICD-10-CM | POA: Diagnosis not present

## 2022-07-31 DIAGNOSIS — M25561 Pain in right knee: Secondary | ICD-10-CM | POA: Insufficient documentation

## 2022-07-31 DIAGNOSIS — M545 Low back pain, unspecified: Secondary | ICD-10-CM | POA: Diagnosis not present

## 2022-07-31 DIAGNOSIS — M6281 Muscle weakness (generalized): Secondary | ICD-10-CM | POA: Diagnosis not present

## 2022-07-31 DIAGNOSIS — G8929 Other chronic pain: Secondary | ICD-10-CM | POA: Insufficient documentation

## 2022-07-31 NOTE — Patient Outreach (Signed)
  Care Coordination  Follow Up Visit Note   07/31/2022 Name: [name] MRN: [id number] DOB: July 28, 1965  [name] is a 57 y.o. year old female who sees Samantha Grandchild, MD for primary care. I spoke with  [name] by phone today.  What matters to the patients health and wellness today?  Managing her health     Goals Addressed             This Visit's Progress    Care Coordination Activities for mental health / physical health support       Activities in order to accomplish goals.   I am glad we were able to get your appointment scheduled with [name] [phone]   your next appointment is July 12th for medication management with [name] and  July 16th with your therapist [name]  Continue with compliance of taking medication prescribed by Doctor and keep all medical appointments Keep medical appointment we reviewed today for physical therapy, If You wanted to find out more information about your Medicaid ( per [facility] we will need to talk to your local DSS worker [name]) I spoke with [name] at the [employer]  [phone] for Medicare options.  At this time [name] is the only one that has all of your providers in network this includes $320 for flex dollars for Benefit card; , dental ,  0 doctor visit, 0 for therapy ; Silver Sneakers , foot care &   purse devise with life station ( "fallen and can't get up").       SDOH assessments and interventions completed:  Yes   Care Coordination Interventions:  Yes, provided  Interventions Today    Flowsheet Row Most Recent Value  Chronic Disease   Chronic disease during today's visit Hypertension (HTN), Congestive Heart Failure (CHF), Diabetes  General Interventions   General Interventions Discussed/Reviewed Communication with, General Interventions Reviewed  [assisted with setting up MyChart]  Communication with --  [mental health providers at [name]]  Education Interventions   Education  Provided Provided Education  Provided Verbal Education On Other, Development worker, community  [reviewed information from [employer] with patient]  Mental Health Interventions   Mental Health Discussed/Reviewed Mental Health Reviewed, Anxiety, Depression, Coping Strategies  [emotional support,  active listening,  solution focused and problem solving.]       Follow up plan: Follow up call scheduled for 08/12/22    Encounter Outcome:  Pt. Visit Completed   Samantha Hines, LCSW Social Work Care Coordination  [name] [name] [phone]

## 2022-07-31 NOTE — Patient Instructions (Signed)
Social Work Visit Information  Thank you for taking time to visit with me today. Please don't hesitate to contact me if I can be of assistance to you.   Following are the goals we discussed today:   Goals Addressed             This Visit's Progress    Care Coordination Activities for mental health / physical health support       Activities in order to accomplish goals.   I am glad we were able to get your appointment scheduled with [facility] [phone]   your next appointment is July 12th for medication management with Cyndi Lennert and  July 16th with your therapist Genia Del  Continue with compliance of taking medication prescribed by Doctor and keep all medical appointments Keep medical appointment we reviewed today for physical therapy, If You wanted to find out more information about your Medicaid ( per [facility] we will need to talk to your local DSS worker [name]) I spoke with [name] at the [employer]  [phone] for Medicare options.  At this time [name] is the only one that has all of your providers in network this includes $320 for flex dollars for Benefit card; , dental ,  0 doctor visit, 0 for therapy ; Silver Sneakers , foot care &   purse devise with life station ( "fallen and can't get up").        Our next appointment is by telephone on 08/12/22    Please call the care guide team at [phone] if you need to cancel or reschedule your appointment.   If you or anyone you know are experiencing a Mental Health or Behavioral Health Crisis or need someone to talk to, please call the Suicide and Crisis Lifeline: 988 call the [place] National Suicide Prevention Lifeline: 712-387-4273 or TTY: 206-888-6263 TTY (623)112-9308) to talk to a trained counselor call 1-800-273-TALK (toll free, 24 hour hotline)   Patient verbalizes understanding of instructions and care plan provided today and agrees to view in MyChart. Active MyChart status and patient  understanding of how to access instructions and care plan via MyChart confirmed with patient.       [name], LCSW Social Work Care Coordination  [facility] [employer] [phone]

## 2022-07-31 NOTE — Therapy (Signed)
OUTPATIENT PHYSICAL THERAPY THORACOLUMBAR TREATMENT   Patient Name: [name] MRN: [id number] DOB:[birth year], 57 y.o., female Today's Date: 07/31/2022  END OF SESSION:  PT End of Session - 07/31/22 1052     Visit Number 14    Number of Visits 29    Date for PT Re-Evaluation 09/13/22    Authorization Type MCR    Progress Note Due on Visit 19    PT Start Time 1058    PT Stop Time 1148    PT Time Calculation (min) 50 min    Activity Tolerance Patient tolerated treatment well    Behavior During Therapy WFL for tasks assessed/performed              Past Medical History:  Diagnosis Date   ADD (attention deficit disorder)    Anxiety    Arthritis    both knees right worse than left   Carpal tunnel syndrome of right wrist    Depression    Essential hypertension, benign    Gallstones    History of smoking 06/22/2016   Hyperlipidemia associated with type 2 diabetes mellitus (HCC), on Zocor 04/04/2011   Hypertension associated with diabetes (HCC) 06/03/2008   Migraines    Morbid obesity (HCC)    Morbid obesity with BMI of 50.0-59.9, adult (HCC) 07/12/2006   NICM (nonischemic cardiomyopathy) (HCC) 07/12/2006   01/16/18 ECHO:    - Procedure narrative: Transthoracic echocardiography. Image   quality was suboptimal. The study was technically difficult.   Intravenous contrast (Definity) was administered. - Left ventricle: The cavity size was moderately dilated. Wall   thickness was increased in a pattern of mild LVH. Systolic   function was moderately to severely reduced. The estimated   ejection fraction w   Normal coronary arteries 04/17/2016   OSA on CPAP 10/28/2014   Spinal stenosis    back pain   Spondylosis without myelopathy or radiculopathy, lumbar region 12/04/2017   Type 2 diabetes mellitus with hyperglycemia [facility])    Past Surgical History:  Procedure Laterality Date   CHOLECYSTECTOMY     DILATATION & CURETTAGE/HYSTEROSCOPY WITH MYOSURE N/A 10/31/2017   Procedure: DILATATION &  CURETTAGE/HYSTEROSCOPY WITH MYOSURE;  Surgeon: Romualdo Bolk, MD;  Location: [facility];  Service: Gynecology;  Laterality: N/A;   HYSTEROSCOPY WITH D & C N/A 05/07/2022   Procedure: DILATATION AND CURETTAGE /HYSTEROSCOPY;  Surgeon: [name], MD;  Location: [facility] OR;  Service: Gynecology;  Laterality: N/A;   RIGHT/LEFT HEART CATH AND CORONARY ANGIOGRAPHY N/A 04/11/2016   Procedure: Right/Left Heart Cath and Coronary Angiography;  Surgeon: Kathleene Hazel, MD;  Location: [facility] INVASIVE CV LAB;  Service: Cardiovascular;  Laterality: N/A;   sonogram for blood clots     no blockages   Patient Active Problem List   Diagnosis Date Noted   PMB (postmenopausal bleeding) 07/03/2022   Postmenopausal bleeding 05/07/2022   Fear of other medical care 05/06/2022   Primary osteoarthritis of both knees 03/06/2022   Bilateral ovarian cysts 03/06/2022   Major depressive disorder, recurrent episode, severe with anxious distress (HCC) 08/09/2021   Attention deficit hyperactivity disorder (ADHD), predominantly inattentive type 08/09/2021   Need for varicella vaccine 08/09/2021   Urge incontinence 08/01/2021   Tinea corporis 03/14/2021   Hyperlipidemia LDL goal <100 03/01/2021   DDD (degenerative disc disease), lumbar 08/08/2020   Non-seasonal allergic rhinitis due to pollen 07/25/2020   Chronic idiopathic constipation 07/25/2020   Visit for screening mammogram 02/11/2020   Chronic bilateral low back pain without sciatica 02/10/2020  Spondylosis without myelopathy or radiculopathy, lumbar region 12/04/2017   Type 2 diabetes mellitus with hyperglycemia (HCC)    Normal coronary arteries 04/17/2016   OSA on CPAP 10/28/2014   Major depressive disorder 10/28/2014   Chronic systolic CHF (congestive heart failure), NYHA class 3 (HCC) 12/06/2013   Hyperlipidemia associated with type 2 diabetes mellitus (HCC), on Zocor 04/04/2011   Abnormal electrocardiography 01/16/2009   Hypertension associated  with diabetes (HCC) 06/03/2008   Morbid obesity with BMI of 50.0-59.9, adult (HCC) 07/12/2006   NICM (nonischemic cardiomyopathy) (HCC) 07/12/2006   Attention deficit disorder 07/12/2006   Migraine without status migrainosus, not intractable 07/12/2006    PCP: [name], MD   REFERRING PROVIDER: Rodolph Bong, MD   REFERRING DIAG:   Chronic pain of right knee  M54.50,G89.29 (ICD-10-CM) - Chronic bilateral low back pain without sciatica    Rationale for Evaluation and Treatment: Rehabilitation  THERAPY DIAG:  Other low back pain  Chronic pain of right knee  Muscle weakness (generalized)  Chronic bilateral low back pain without sciatica  ONSET DATE: >5  SUBJECTIVE:                                                                                                                                                                                           SUBJECTIVE STATEMENT: Pt with increased R sided LBP. Had MRI for low back on 5/29, waiting to schedule follow up for this. "I'm petrified of trying land therapy."  PERTINENT HISTORY:  [name] 03/19/22: 57 y.o. female with chronic low back pain and chronic right knee pain  . . . bothersome paresthesias sensation bilateral lower extremities.  . . . Right knee pain due to exacerbation of DJD.  . . . Chronic back pain thought to be due to core weakness and facet arthritis changes seen on CT scan.   PAIN:  Are you having pain? Yes: NPRS scale: 8/10 Pain location: LB to bilat hips Pain description: ache, pressure Aggravating factors: standing and moving, walking through home Relieving factors: sitting  PRECAUTIONS: Knee and Back  WEIGHT BEARING RESTRICTIONS: No  FALLS:  Has patient fallen in last 6 months? No  LIVING ENVIRONMENT: Lives with: lives with their family and lives with their spouse Lives in: House/apartment Stairs: Yes: Internal: 16 steps; on right going up Has following equipment at home: Dan Humphreys - 4  wheeled and shower chair  OCCUPATION: disabled  PLOF: Needs assistance with ADLs, Needs assistance with homemaking, and Needs assistance with gait  PATIENT GOALS: increased movement without pain, ADL's, cook, climb stairs, go to the store without hurting  NEXT MD VISIT: 6  weeks  OBJECTIVE:   DIAGNOSTIC FINDINGS:  X-ray images right knee: Mild medial compartment DJD.  Mild patellofemoral DJD  CT scan images of lumbar spine: Diffuse degenerative changes present throughout lumbar spine   PATIENT SURVEYS:  FOTO Primary score 22% with goal 39% 06/28/22: 22%  SCREENING FOR RED FLAGS: none  COGNITION: Overall cognitive status: Within functional limits for tasks assessed     SENSATION: wfl  POSTURE: increased thoracic kyphosis,right shoulder elevated  PALPATION: No tenderness although difficulty to assess due to body habitus  LUMBAR ROM:   AROM eval 06/28/22 07/19/22  Flexion Mid shin    Extension 0 10d wfl  Right lateral flexion 50% 75% full  Left lateral flexion 50% 75% full  Right rotation 100%    Left rotation 100%     (Blank rows = not tested)  LOWER EXTREMITY ROM:      Limited due to body habitus  LOWER EXTREMITY MMT:    MMT Right eval Left eval 06/28/22 Right / left 07/19/22 Right / Left  Hip flexion 3+ 3+ 4-/5 35.6 / 41.7  Hip extension      Hip abduction    35.9 / 33/9  Hip adduction      Hip internal rotation      Hip external rotation      Knee flexion 4- 4- 4+ / 4+   Knee extension 4+ 4+ 5- / 5- 25.6 / 30.1  Ankle dorsiflexion      Ankle plantarflexion      Ankle inversion      Ankle eversion       (Blank rows = not tested)  LUMBAR SPECIAL TESTS:  Slump test: Negative  FUNCTIONAL TESTS:  5 times sit to stand: 24.16 seated on scooter Timed up and go (TUG): 20.90 from seated on scooter Berg: 35/56  06/21/22:  5x STS:  18.40 seated on scooter    TUG 12.91 from seated on scooter  06/28/22: Sharlene Motts: 40/56    07/19/22 TUG: 14.85      5 x STS:  17.11  GAIT: Distance walked: 30 ft Assistive device utilized: None Level of assistance:  none Comments: shoulder retraction, antalgic gait pattern  TODAY'S TREATMENT:        6/4: Seated ball roll at table to L side x10 5" hold  Seated adductor squeeze with trA- 5" 1x10 (4" step under feet ) Seated clam RTB x20 Seated HSS (4" step) 2x30sec each Standing side bend 10sec x3 ea Seated cat/cow- gentle (5" hold x5ea)    Previous : Objective testing Re-assessment                                                                                                                         Pt seen for aquatic therapy today.  Treatment took place in water 3.5-4.75 ft in depth at the [facility] pool. Temp of water was 91.  Pt entered/exited the pool via stairs independently with bilat rail.  * without support:  walking forward/ backward  *side step with shoulder add/abd with rainbow HB x 2 laps * holding rainbow hand floats under water at sides, marching forward/ backward * vertical squats x 8 rainbow HB submerged to sides * UE on yellow hb:  3 way tap, alternating LEs x 10 each LE * TrA set with thin squoodle pull down x 10 wide stance * straddling yellow noodle:  cycling, suspended jumping jack LEs, cc ski  * back float for decompression   Pt requires the buoyancy and hydrostatic pressure of water for support, and to offload joints by unweighting joint load by at least 50 % in navel deep water and by at least 75-80% in chest to neck deep water.  Viscosity of the water is needed for resistance of strengthening. Water current perturbations provides challenge to standing balance requiring increased core activation.     PATIENT EDUCATION:  Education details: Aquatic therapy progressions/ modifications   --given list of pools hand out  Person educated: Patient Education method: Explanation Education comprehension: verbalized understanding  HOME EXERCISE PROGRAM: Pt will climb  stairs 1 -2 x weekly 06/28/22  ASSESSMENT:  CLINICAL IMPRESSION: Educated patient on land therapy expectations.  Initiated land session with gentle range of motion and strengthening exercises.  Patient with overall good tolerance to gentle tasks with cues to stay within pain limitations.  Significant cueing required for patient to stay on task.  The patient also requires cues for breathing during exertion.  With seated hamstring stretch patient reported feeling greater tightness in the left hamstring compared to right.  Greater weakness observed in with seated marching in left LE compared to right.  Will assess patient's response to first land therapy visit and provide patient with land-based HEP next week depending response to exercise.  Educated patient on DOMS and expectations post PT.  Will assess patient's condition next visit and progress as tolerated.  OBJECTIVE IMPAIRMENTS: decreased activity tolerance, decreased balance, decreased endurance, decreased knowledge of use of DME, decreased mobility, difficulty walking, decreased strength, impaired sensation, postural dysfunction, obesity, and pain.   ACTIVITY LIMITATIONS: carrying, lifting, bending, sitting, standing, squatting, stairs, transfers, bed mobility, locomotion level, and caring for others  PARTICIPATION LIMITATIONS: meal prep, cleaning, laundry, shopping, community activity, and occupation  PERSONAL FACTORS: Age, Behavior pattern, Fitness, Time since onset of injury/illness/exacerbation, and 1-2 comorbidities: cardiomyopathy, Oa knees and back, depression/anxiety  are also affecting patient's functional outcome.   REHAB POTENTIAL: Good  CLINICAL DECISION MAKING: Evolving/moderate complexity  EVALUATION COMPLEXITY: Moderate   GOALS: Goals reviewed with patient? Yes  SHORT TERM GOALS: Target date: 06/24/22  Pt will tolerate full aquatic sessions consistently without increase in pain and with improving function to demonstrate  good toleration and effectiveness of intervention.   Baseline:TBA Goal status:MET - 06/14/22  2.  Pt will climb stairs using combination of step to and alternating pattern without pausing consistently Baseline: improving - some pauses, but not consistent - 06/19/22 Goal status:ONGOING   3.  Pt will report climbing stairs at home 1-2 x weekly to access bedroom and full bath more regularly. Baseline: <1x weekly Pt reports she has been showering at therapy and has not been climbing stair Goal status: ongoing 07/19/22  4.  Pt will tolerate walking 8-10 continuous minutes in pool to demonstrate improving toleration to activity Baseline: na Goal status: MET - 06/14/22  5.  Pt will improve on 5 X STS test to <or=   18s to demonstrate improving functional lower extremity strength, transitional movements, and balance  Baseline: 24.16 at eval;  18.4s on 06/21/22 Goal status: MET 07/19/22 (17.11)  6.  Pt will improve on Tug test to <or= 14s to demonstrate improvement in lower extremity function, mobility and decreased fall risk.  Baseline:  20.90 at eval;  12.91 on 06/21/22 Goal status: MET   LONG TERM GOALS: Target date: 07/19/22  Pt to meet stated Foto Goal of 39% to demonstrate improved perception of ability Baseline: 22% Goal status: Ongoing 06/28/22  2.  Pt will be indep with final HEP's (land and aquatic as appropriate) for continued management of condition  Baseline:  Goal status: in process 07/19/22  3.  Pt will walk in store for up to or >15 mins pushing cart Baseline: rides scooter Goal status: Ongoing -06/28/22  4.  Pt will report decrease in pain with amb to <5/10 to demonstrate improvement management of condition Baseline: 9/10 Goal status: ongoing  06/28/22  5.  Pt will improve on Berg balance test to >/= 50/56 to demonstrate a decrease in fall risk.  Baseline: 35/56 Goal status: in progress 06/28/22 (40/56)  6.  Pt will increase strength of hip by 1 full grade to improve functional  mobility. Baseline: see chart Goal status: deferred (changes measurement tool) 07/19/22  7.Pt will improve strength in all areas listed by 10lb to demonstrate improved overall physical function  Baseline: see chart  Goal Status: NEW   PLAN:  PT FREQUENCY: 1-2x/week  PT DURATION: 8 weeks alternating settings land and aquatics  PLANNED INTERVENTIONS: Therapeutic exercises, Therapeutic activity, Neuromuscular re-education, Balance training, Gait training, Patient/Family education, Self Care, Joint mobilization, Stair training, DME instructions, Aquatic Therapy, Dry Needling, Cryotherapy, Moist heat, Taping, Ultrasound, Biofeedback, Ionotophoresis 4mg /ml Dexamethasone, Manual therapy, and Re-evaluation.  PLAN FOR NEXT SESSION: Progress strength, ROM, balance, gait and pain reduction land and aquatics.  Consider DN.  Riki Altes, PTA  07/31/22 11:58 AM [facility] [address] [place], [place], [address] Phone: [phone]   Fax:  [phone]

## 2022-08-01 NOTE — Progress Notes (Signed)
Lumbar spine MRI shows several areas were nerves could possibly be pinched causing some leg pain.  He also have some facet joint arthritis in the low back that could cause back pain.  Recommend return to clinic to go over the results in full detail and talk about the back injection options that you have.

## 2022-08-02 ENCOUNTER — Ambulatory Visit (HOSPITAL_BASED_OUTPATIENT_CLINIC_OR_DEPARTMENT_OTHER): Payer: Medicare Other | Admitting: Physical Therapy

## 2022-08-02 ENCOUNTER — Encounter (HOSPITAL_BASED_OUTPATIENT_CLINIC_OR_DEPARTMENT_OTHER): Payer: Self-pay | Admitting: Physical Therapy

## 2022-08-02 DIAGNOSIS — M25561 Pain in right knee: Secondary | ICD-10-CM | POA: Diagnosis not present

## 2022-08-02 DIAGNOSIS — M545 Low back pain, unspecified: Secondary | ICD-10-CM

## 2022-08-02 DIAGNOSIS — G8929 Other chronic pain: Secondary | ICD-10-CM

## 2022-08-02 DIAGNOSIS — M6281 Muscle weakness (generalized): Secondary | ICD-10-CM | POA: Diagnosis not present

## 2022-08-02 DIAGNOSIS — M5459 Other low back pain: Secondary | ICD-10-CM | POA: Diagnosis not present

## 2022-08-02 NOTE — Therapy (Signed)
OUTPATIENT PHYSICAL THERAPY THORACOLUMBAR TREATMENT   Patient Name: [name] MRN: [id number] DOB:[birth year], 57 y.o., female Today's Date: 08/02/2022  END OF SESSION:  PT End of Session - 08/02/22 1052     Visit Number 15    Number of Visits 29    Date for PT Re-Evaluation 09/13/22    Authorization Type MCR    Progress Note Due on Visit 19    PT Start Time 1033    PT Stop Time 1114    PT Time Calculation (min) 41 min    Activity Tolerance Patient tolerated treatment well    Behavior During Therapy WFL for tasks assessed/performed              Past Medical History:  Diagnosis Date   ADD (attention deficit disorder)    Anxiety    Arthritis    both knees right worse than left   Carpal tunnel syndrome of right wrist    Depression    Essential hypertension, benign    Gallstones    History of smoking 06/22/2016   Hyperlipidemia associated with type 2 diabetes mellitus (HCC), on Zocor 04/04/2011   Hypertension associated with diabetes (HCC) 06/03/2008   Migraines    Morbid obesity (HCC)    Morbid obesity with BMI of 50.0-59.9, adult (HCC) 07/12/2006   NICM (nonischemic cardiomyopathy) (HCC) 07/12/2006   01/16/18 ECHO:    - Procedure narrative: Transthoracic echocardiography. Image   quality was suboptimal. The study was technically difficult.   Intravenous contrast (Definity) was administered. - Left ventricle: The cavity size was moderately dilated. Wall   thickness was increased in a pattern of mild LVH. Systolic   function was moderately to severely reduced. The estimated   ejection fraction w   Normal coronary arteries 04/17/2016   OSA on CPAP 10/28/2014   Spinal stenosis    back pain   Spondylosis without myelopathy or radiculopathy, lumbar region 12/04/2017   Type 2 diabetes mellitus with hyperglycemia [facility])    Past Surgical History:  Procedure Laterality Date   CHOLECYSTECTOMY     DILATATION & CURETTAGE/HYSTEROSCOPY WITH MYOSURE N/A 10/31/2017   Procedure: DILATATION &  CURETTAGE/HYSTEROSCOPY WITH MYOSURE;  Surgeon: Romualdo Bolk, MD;  Location: [facility];  Service: Gynecology;  Laterality: N/A;   HYSTEROSCOPY WITH D & C N/A 05/07/2022   Procedure: DILATATION AND CURETTAGE /HYSTEROSCOPY;  Surgeon: [name], MD;  Location: [facility] OR;  Service: Gynecology;  Laterality: N/A;   RIGHT/LEFT HEART CATH AND CORONARY ANGIOGRAPHY N/A 04/11/2016   Procedure: Right/Left Heart Cath and Coronary Angiography;  Surgeon: Kathleene Hazel, MD;  Location: [facility] INVASIVE CV [facility];  Service: Cardiovascular;  Laterality: N/A;   sonogram for blood clots     no blockages   Patient Active Problem List   Diagnosis Date Noted   PMB (postmenopausal bleeding) 07/03/2022   Postmenopausal bleeding 05/07/2022   Fear of other medical care 05/06/2022   Primary osteoarthritis of both knees 03/06/2022   Bilateral ovarian cysts 03/06/2022   Major depressive disorder, recurrent episode, severe with anxious distress (HCC) 08/09/2021   Attention deficit hyperactivity disorder (ADHD), predominantly inattentive type 08/09/2021   Need for varicella vaccine 08/09/2021   Urge incontinence 08/01/2021   Tinea corporis 03/14/2021   Hyperlipidemia LDL goal <100 03/01/2021   DDD (degenerative disc disease), lumbar 08/08/2020   Non-seasonal allergic rhinitis due to pollen 07/25/2020   Chronic idiopathic constipation 07/25/2020   Visit for screening mammogram 02/11/2020   Chronic bilateral low back pain without sciatica 02/10/2020  Spondylosis without myelopathy or radiculopathy, lumbar region 12/04/2017   Type 2 diabetes mellitus with hyperglycemia (HCC)    Normal coronary arteries 04/17/2016   OSA on CPAP 10/28/2014   Major depressive disorder 10/28/2014   Chronic systolic CHF (congestive heart failure), NYHA class 3 (HCC) 12/06/2013   Hyperlipidemia associated with type 2 diabetes mellitus (HCC), on Zocor 04/04/2011   Abnormal electrocardiography 01/16/2009   Hypertension associated  with diabetes (HCC) 06/03/2008   Morbid obesity with BMI of 50.0-59.9, adult (HCC) 07/12/2006   NICM (nonischemic cardiomyopathy) (HCC) 07/12/2006   Attention deficit disorder 07/12/2006   Migraine without status migrainosus, not intractable 07/12/2006    PCP: [name], MD   REFERRING PROVIDER: Rodolph Bong, MD   REFERRING DIAG:   Chronic pain of right knee  M54.50,G89.29 (ICD-10-CM) - Chronic bilateral low back pain without sciatica    Rationale for Evaluation and Treatment: Rehabilitation  THERAPY DIAG:  Other low back pain  Chronic pain of right knee  Muscle weakness (generalized)  Chronic bilateral low back pain without sciatica  ONSET DATE: >5  SUBJECTIVE:                                                                                                                                                                                           SUBJECTIVE STATEMENT: Pt reports she tolerated land session well, .  She reports she has had pain in Rt shoulder up to neck, starting prior to land session.  "I just took ibuprofen for it today".  Pt reports that she joined the [employer] with her family and went to pool with son this week.   PERTINENT HISTORY:  [name] 03/19/22: 57 y.o. female with chronic low back pain and chronic right knee pain  . . . bothersome paresthesias sensation bilateral lower extremities.  . . . Right knee pain due to exacerbation of DJD.  . . . Chronic back pain thought to be due to core weakness and facet arthritis changes seen on CT scan.   PAIN:  Are you having pain? Yes: NPRS scale: 8/10 Pain location: LB to bilat hips, Rt shoulder  Pain description: ache, pressure Aggravating factors: standing and moving, walking through home Relieving factors: sitting  PRECAUTIONS: Knee and Back  WEIGHT BEARING RESTRICTIONS: No  FALLS:  Has patient fallen in last 6 months? No  LIVING ENVIRONMENT: Lives with: lives with their family and lives with  their spouse Lives in: House/apartment Stairs: Yes: Internal: 16 steps; on right going up Has following equipment at home: Dan Humphreys - 4 wheeled and shower chair  OCCUPATION: disabled  PLOF: Needs assistance with ADLs,  Needs assistance with homemaking, and Needs assistance with gait  PATIENT GOALS: increased movement without pain, ADL's, cook, climb stairs, go to the store without hurting  NEXT MD VISIT: 6 weeks  OBJECTIVE:   DIAGNOSTIC FINDINGS:  X-ray images right knee: Mild medial compartment DJD.  Mild patellofemoral DJD  CT scan images of lumbar spine: Diffuse degenerative changes present throughout lumbar spine   PATIENT SURVEYS:  FOTO Primary score 22% with goal 39% 06/28/22: 22%  SCREENING FOR RED FLAGS: none  COGNITION: Overall cognitive status: Within functional limits for tasks assessed     SENSATION: wfl  POSTURE: increased thoracic kyphosis,right shoulder elevated  PALPATION: No tenderness although difficulty to assess due to body habitus  LUMBAR ROM:   AROM eval 06/28/22 07/19/22  Flexion Mid shin    Extension 0 10d wfl  Right lateral flexion 50% 75% full  Left lateral flexion 50% 75% full  Right rotation 100%    Left rotation 100%     (Blank rows = not tested)  LOWER EXTREMITY ROM:      Limited due to body habitus  LOWER EXTREMITY MMT:    MMT Right eval Left eval 06/28/22 Right / left 07/19/22 Right / Left  Hip flexion 3+ 3+ 4-/5 35.6 / 41.7  Hip extension      Hip abduction    35.9 / 33/9  Hip adduction      Hip internal rotation      Hip external rotation      Knee flexion 4- 4- 4+ / 4+   Knee extension 4+ 4+ 5- / 5- 25.6 / 30.1  Ankle dorsiflexion      Ankle plantarflexion      Ankle inversion      Ankle eversion       (Blank rows = not tested)  LUMBAR SPECIAL TESTS:  Slump test: Negative  FUNCTIONAL TESTS:  5 times sit to stand: 24.16 seated on scooter Timed up and go (TUG): 20.90 from seated on scooter Berg: 35/56  06/21/22:   5x STS:  18.40 seated on scooter    TUG 12.91 from seated on scooter  06/28/22: Sharlene Motts: 40/56    07/19/22 TUG: 14.85      5 x STS: 17.11  GAIT: Distance walked: 30 ft Assistive device utilized: None Level of assistance:  none Comments: shoulder retraction, antalgic gait pattern  TODAY'S TREATMENT:       6/7 Pt seen for aquatic therapy today.  Treatment took place in water 3.5-4.75 ft in depth at the [facility] pool. Temp of water was 91.  Pt entered/exited the pool via stairs independently with bilat rail.  * without support:  walking forward/ backward  *side step with shoulder add/abd without resistance * Rt levator stretch x 20s * bow and arrow with same side step back x 8 each side, demo and heavy cues for technique  * marching with alternating arm row with yellow hand floats on surface, forward/ backward * TrA set with solid noodle pull down x 10 wide stance * UE on yellow hand floats:  3 way tap x 5 each side, single leg clams x 8 each side; leg swings into hip flex/ext  10 each * straddling yellow noodle:  cycling  6/4: Seated ball roll at table to L side x10 5" hold  Seated adductor squeeze with trA- 5" 1x10 (4" step under feet ) Seated clam RTB x20 Seated HSS (4" step) 2x30sec each Standing side bend 10sec x3 ea Seated cat/cow- gentle (5"  hold x5ea)                                                                                                                              PATIENT EDUCATION:  Education details: Aquatic therapy progressions/ modifications Person educated: Patient Education method: Explanation Education comprehension: verbalized understanding  HOME EXERCISE PROGRAM: Pt will climb stairs 1 -2 x weekly 06/28/22  ASSESSMENT:  CLINICAL IMPRESSION: Pt able to tolerate light shoulder exercises without increase in pain.  Back pain gradually decreased while in water.  Encouraged continued use of community pool to provide extended pain relief  outside of aquatic therapy sessions.  Plan to issue aquatic HEP for her next visit.   Assess FOTO next visit if time allows.   OBJECTIVE IMPAIRMENTS: decreased activity tolerance, decreased balance, decreased endurance, decreased knowledge of use of DME, decreased mobility, difficulty walking, decreased strength, impaired sensation, postural dysfunction, obesity, and pain.   ACTIVITY LIMITATIONS: carrying, lifting, bending, sitting, standing, squatting, stairs, transfers, bed mobility, locomotion level, and caring for others  PARTICIPATION LIMITATIONS: meal prep, cleaning, laundry, shopping, community activity, and occupation  PERSONAL FACTORS: Age, Behavior pattern, Fitness, Time since onset of injury/illness/exacerbation, and 1-2 comorbidities: cardiomyopathy, Oa knees and back, depression/anxiety  are also affecting patient's functional outcome.   REHAB POTENTIAL: Good  CLINICAL DECISION MAKING: Evolving/moderate complexity  EVALUATION COMPLEXITY: Moderate   GOALS: Goals reviewed with patient? Yes  SHORT TERM GOALS: Target date: 06/24/22  Pt will tolerate full aquatic sessions consistently without increase in pain and with improving function to demonstrate good toleration and effectiveness of intervention.   Baseline:TBA Goal status:MET - 06/14/22  2.  Pt will climb stairs using combination of step to and alternating pattern without pausing consistently Baseline: improving - some pauses, but not consistent - 06/19/22 Goal status:ONGOING   3.  Pt will report climbing stairs at home 1-2 x weekly to access bedroom and full bath more regularly. Baseline: <1x weekly Pt reports she has been showering at therapy and has not been climbing stair Goal status: ongoing 07/19/22  4.  Pt will tolerate walking 8-10 continuous minutes in pool to demonstrate improving toleration to activity Baseline: na Goal status: MET - 06/14/22  5.  Pt will improve on 5 X STS test to <or=   18s to demonstrate  improving functional lower extremity strength, transitional movements, and balance Baseline: 24.16 at eval;  18.4s on 06/21/22 Goal status: MET 07/19/22 (17.11)  6.  Pt will improve on Tug test to <or= 14s to demonstrate improvement in lower extremity function, mobility and decreased fall risk.  Baseline:  20.90 at eval;  12.91 on 06/21/22 Goal status: MET   LONG TERM GOALS: Target date: 09/13/22  Pt to meet stated Foto Goal of 39% to demonstrate improved perception of ability Baseline: 22% Goal status: Ongoing 06/28/22  2.  Pt will be indep with final HEP's (land and aquatic as appropriate) for continued management of condition  Baseline:  Goal  status: in process 07/19/22  3.  Pt will walk in store for up to or >15 mins pushing cart Baseline: rides scooter Goal status: Ongoing -06/28/22  4.  Pt will report decrease in pain with amb to <5/10 to demonstrate improvement management of condition Baseline: 9/10 Goal status: ongoing  06/28/22  5.  Pt will improve on Berg balance test to >/= 50/56 to demonstrate a decrease in fall risk.  Baseline: 35/56 Goal status: in progress 06/28/22 (40/56)  6.  Pt will increase strength of hip by 1 full grade to improve functional mobility. Baseline: see chart Goal status: deferred (changes measurement tool) 07/19/22  7.Pt will improve strength in all areas listed by 10lb to demonstrate improved overall physical function  Baseline: see chart  Goal Status: NEW   PLAN:  PT FREQUENCY: 1-2x/week  PT DURATION: 8 weeks alternating settings land and aquatics  PLANNED INTERVENTIONS: Therapeutic exercises, Therapeutic activity, Neuromuscular re-education, Balance training, Gait training, Patient/Family education, Self Care, Joint mobilization, Stair training, DME instructions, Aquatic Therapy, Dry Needling, Cryotherapy, Moist heat, Taping, Ultrasound, Biofeedback, Ionotophoresis 4mg /ml Dexamethasone, Manual therapy, and Re-evaluation.  PLAN FOR NEXT SESSION:  Progress strength, ROM, balance, gait and pain reduction land and aquatics.  Consider DN.    Mayer Camel, PTA 08/02/22 5:17 PM [facility] [address] [place], [place], [address] Phone: [phone]   Fax:  [phone]

## 2022-08-03 ENCOUNTER — Other Ambulatory Visit: Payer: Self-pay | Admitting: Internal Medicine

## 2022-08-03 DIAGNOSIS — E785 Hyperlipidemia, unspecified: Secondary | ICD-10-CM

## 2022-08-06 ENCOUNTER — Telehealth: Payer: Self-pay | Admitting: Family Medicine

## 2022-08-06 NOTE — Telephone Encounter (Signed)
Patient would like a call back before having IUD placed on the 6/20.

## 2022-08-07 ENCOUNTER — Ambulatory Visit (HOSPITAL_BASED_OUTPATIENT_CLINIC_OR_DEPARTMENT_OTHER): Payer: Medicare Other

## 2022-08-07 ENCOUNTER — Encounter (HOSPITAL_BASED_OUTPATIENT_CLINIC_OR_DEPARTMENT_OTHER): Payer: Self-pay

## 2022-08-07 DIAGNOSIS — M545 Low back pain, unspecified: Secondary | ICD-10-CM

## 2022-08-07 DIAGNOSIS — M5459 Other low back pain: Secondary | ICD-10-CM | POA: Diagnosis not present

## 2022-08-07 DIAGNOSIS — M25561 Pain in right knee: Secondary | ICD-10-CM | POA: Diagnosis not present

## 2022-08-07 DIAGNOSIS — M6281 Muscle weakness (generalized): Secondary | ICD-10-CM | POA: Diagnosis not present

## 2022-08-07 DIAGNOSIS — G8929 Other chronic pain: Secondary | ICD-10-CM | POA: Diagnosis not present

## 2022-08-07 NOTE — Therapy (Signed)
OUTPATIENT PHYSICAL THERAPY THORACOLUMBAR TREATMENT   Patient Name: [name] MRN: [id number] DOB:April 18, 1965, 57 y.o., female Today's Date: 08/07/2022  END OF SESSION:  PT End of Session - 08/07/22 1126     Visit Number 16    Number of Visits 29    Date for PT Re-Evaluation 09/13/22    Authorization Type MCR    Progress Note Due on Visit 19    PT Start Time 1105    PT Stop Time 1147    PT Time Calculation (min) 42 min    Activity Tolerance Patient tolerated treatment well    Behavior During Therapy WFL for tasks assessed/performed               Past Medical History:  Diagnosis Date   ADD (attention deficit disorder)    Anxiety    Arthritis    both knees right worse than left   Carpal tunnel syndrome of right wrist    Depression    Essential hypertension, benign    Gallstones    History of smoking 06/22/2016   Hyperlipidemia associated with type 2 diabetes mellitus (HCC), on Zocor 04/04/2011   Hypertension associated with diabetes (HCC) 06/03/2008   Migraines    Morbid obesity (HCC)    Morbid obesity with BMI of 50.0-59.9, adult (HCC) 07/12/2006   NICM (nonischemic cardiomyopathy) (HCC) 07/12/2006   01/16/18 ECHO:    - Procedure narrative: Transthoracic echocardiography. Image   quality was suboptimal. The study was technically difficult.   Intravenous contrast (Definity) was administered. - Left ventricle: The cavity size was moderately dilated. Wall   thickness was increased in a pattern of mild LVH. Systolic   function was moderately to severely reduced. The estimated   ejection fraction w   Normal coronary arteries 04/17/2016   OSA on CPAP 10/28/2014   Spinal stenosis    back pain   Spondylosis without myelopathy or radiculopathy, lumbar region 12/04/2017   Type 2 diabetes mellitus with hyperglycemia [facility])    Past Surgical History:  Procedure Laterality Date   CHOLECYSTECTOMY     DILATATION & CURETTAGE/HYSTEROSCOPY WITH MYOSURE N/A 10/31/2017   Procedure: DILATATION  & CURETTAGE/HYSTEROSCOPY WITH MYOSURE;  Surgeon: Romualdo Bolk, MD;  Location: [facility];  Service: Gynecology;  Laterality: N/A;   HYSTEROSCOPY WITH D & C N/A 05/07/2022   Procedure: DILATATION AND CURETTAGE /HYSTEROSCOPY;  Surgeon: [name], MD;  Location: [facility];  Service: Gynecology;  Laterality: N/A;   RIGHT/LEFT HEART CATH AND CORONARY ANGIOGRAPHY N/A 04/11/2016   Procedure: Right/Left Heart Cath and Coronary Angiography;  Surgeon: Kathleene Hazel, MD;  Location: [facility] INVASIVE CV LAB;  Service: Cardiovascular;  Laterality: N/A;   sonogram for blood clots     no blockages   Patient Active Problem List   Diagnosis Date Noted   PMB (postmenopausal bleeding) 07/03/2022   Postmenopausal bleeding 05/07/2022   Fear of other medical care 05/06/2022   Primary osteoarthritis of both knees 03/06/2022   Bilateral ovarian cysts 03/06/2022   Major depressive disorder, recurrent episode, severe with anxious distress (HCC) 08/09/2021   Attention deficit hyperactivity disorder (ADHD), predominantly inattentive type 08/09/2021   Need for varicella vaccine 08/09/2021   Urge incontinence 08/01/2021   Tinea corporis 03/14/2021   Hyperlipidemia LDL goal <100 03/01/2021   DDD (degenerative disc disease), lumbar 08/08/2020   Non-seasonal allergic rhinitis due to pollen 07/25/2020   Chronic idiopathic constipation 07/25/2020   Visit for screening mammogram 02/11/2020   Chronic bilateral low back pain without sciatica 02/10/2020  Spondylosis without myelopathy or radiculopathy, lumbar region 12/04/2017   Type 2 diabetes mellitus with hyperglycemia (HCC)    Normal coronary arteries 04/17/2016   OSA on CPAP 10/28/2014   Major depressive disorder 10/28/2014   Chronic systolic CHF (congestive heart failure), NYHA class 3 (HCC) 12/06/2013   Hyperlipidemia associated with type 2 diabetes mellitus (HCC), on Zocor 04/04/2011   Abnormal electrocardiography 01/16/2009   Hypertension associated  with diabetes (HCC) 06/03/2008   Morbid obesity with BMI of 50.0-59.9, adult (HCC) 07/12/2006   NICM (nonischemic cardiomyopathy) (HCC) 07/12/2006   Attention deficit disorder 07/12/2006   Migraine without status migrainosus, not intractable 07/12/2006    PCP: [name], MD   REFERRING PROVIDER: Rodolph Bong, MD   REFERRING DIAG:   Chronic pain of right knee  M54.50,G89.29 (ICD-10-CM) - Chronic bilateral low back pain without sciatica    Rationale for Evaluation and Treatment: Rehabilitation  THERAPY DIAG:  Other low back pain  Chronic pain of right knee  Muscle weakness (generalized)  Chronic bilateral low back pain without sciatica  ONSET DATE: >5  SUBJECTIVE:                                                                                                                                                                                           SUBJECTIVE STATEMENT: Pt reports she had to lift her scooter by herself into/out of her vehicle. Improved pan level compared to last land appt. Some R knee pain today. Going over MRI results with MD this week. She has been having to do more around the house with her husband being out of town which has been difficult. She states she has trouble standing to do dishes and fix small meals.   PERTINENT HISTORY:  [name] 03/19/22: 57 y.o. female with chronic low back pain and chronic right knee pain  . . . bothersome paresthesias sensation bilateral lower extremities.  . . . Right knee pain due to exacerbation of DJD.  . . . Chronic back pain thought to be due to core weakness and facet arthritis changes seen on CT scan.   PAIN:  Are you having pain? Yes: NPRS scale: 8/10 Pain location: LB to bilat hips, Rt shoulder, neck  Pain description: ache, pressure Aggravating factors: standing and moving, walking through home Relieving factors: sitting  PRECAUTIONS: Knee and Back  WEIGHT BEARING RESTRICTIONS: No  FALLS:  Has  patient fallen in last 6 months? No  LIVING ENVIRONMENT: Lives with: lives with their family and lives with their spouse Lives in: House/apartment Stairs: Yes: Internal: 16 steps; on right going up Has following equipment at  home: Walker - 4 wheeled and shower chair  OCCUPATION: disabled  PLOF: Needs assistance with ADLs, Needs assistance with homemaking, and Needs assistance with gait  PATIENT GOALS: increased movement without pain, ADL's, cook, climb stairs, go to the store without hurting  NEXT MD VISIT: 6 weeks  OBJECTIVE:   DIAGNOSTIC FINDINGS:  X-ray images right knee: Mild medial compartment DJD.  Mild patellofemoral DJD  CT scan images of lumbar spine: Diffuse degenerative changes present throughout lumbar spine   PATIENT SURVEYS:  FOTO Primary score 22% with goal 39% 06/28/22: 22% 08/07/22: Lumbar: 16%, knee: 29%  SCREENING FOR RED FLAGS: none  COGNITION: Overall cognitive status: Within functional limits for tasks assessed     SENSATION: wfl  POSTURE: increased thoracic kyphosis,right shoulder elevated  PALPATION: No tenderness although difficulty to assess due to body habitus  LUMBAR ROM:   AROM eval 06/28/22 07/19/22  Flexion Mid shin    Extension 0 10d wfl  Right lateral flexion 50% 75% full  Left lateral flexion 50% 75% full  Right rotation 100%    Left rotation 100%     (Blank rows = not tested)  LOWER EXTREMITY ROM:      Limited due to body habitus  LOWER EXTREMITY MMT:    MMT Right eval Left eval 06/28/22 Right / left 07/19/22 Right / Left  Hip flexion 3+ 3+ 4-/5 35.6 / 41.7  Hip extension      Hip abduction    35.9 / 33/9  Hip adduction      Hip internal rotation      Hip external rotation      Knee flexion 4- 4- 4+ / 4+   Knee extension 4+ 4+ 5- / 5- 25.6 / 30.1  Ankle dorsiflexion      Ankle plantarflexion      Ankle inversion      Ankle eversion       (Blank rows = not tested)  LUMBAR SPECIAL TESTS:  Slump test:  Negative  FUNCTIONAL TESTS:  5 times sit to stand: 24.16 seated on scooter Timed up and go (TUG): 20.90 from seated on scooter Berg: 35/56  06/21/22:  5x STS:  18.40 seated on scooter    TUG 12.91 from seated on scooter  06/28/22: Sharlene Motts: 40/56    07/19/22 TUG: 14.85      5 x STS: 17.11  GAIT: Distance walked: 30 ft Assistive device utilized: None Level of assistance:  none Comments: shoulder retraction, antalgic gait pattern  TODAY'S TREATMENT:        6/11: Seated HSS (4" step) x30sec each Standing side bend 10sec x3 ea Standing HR 2x10 Side stepping at railing Standing lumbar flexion/extension  Standing ITB stretch x30sec each L stretch at rail  Seated assisted partial sit up- x10 Standing/seated PNF chops x10 standing, then had to sit for second set.   6/7 Pt seen for aquatic therapy today.  Treatment took place in water 3.5-4.75 ft in depth at the [facility] pool. Temp of water was 91.  Pt entered/exited the pool via stairs independently with bilat rail.  * without support:  walking forward/ backward  *side step with shoulder add/abd without resistance * Rt levator stretch x 20s * bow and arrow with same side step back x 8 each side, demo and heavy cues for technique  * marching with alternating arm row with yellow hand floats on surface, forward/ backward * TrA set with solid noodle pull down x 10 wide stance * UE on yellow  hand floats:  3 way tap x 5 each side, single leg clams x 8 each side; leg swings into hip flex/ext  10 each * straddling yellow noodle:  cycling  6/4: Seated ball roll at table to L side x10 5" hold  Seated adductor squeeze with trA- 5" 1x10 (4" step under feet ) Seated clam RTB x20 Seated HSS (4" step) 2x30sec each Standing side bend 10sec x3 ea Seated cat/cow- gentle (5" hold x5ea)                                                                                                                              PATIENT EDUCATION:   Education details: Aquatic therapy progressions/ modifications Person educated: Patient Education method: Explanation Education comprehension: verbalized understanding  HOME EXERCISE PROGRAM: Pt will climb stairs 1 -2 x weekly 06/28/22  ASSESSMENT:  CLINICAL IMPRESSION: Decrease in FOTO scores from last capture. Initiated standing exercises with mild-moderate difficulty reported. She requires seated rest breaks due to fatigue and discomfort in low back with prolonged standing. Pt able to perform both lumbar extension and flexion fairly easily which she reports are both relieving postures. States she has trouble raising from stooped position at kitchen counter into upright position. Added seated core strengthening with overall good tolerance. Will monitor for soreness and pain and progress as able.   OBJECTIVE IMPAIRMENTS: decreased activity tolerance, decreased balance, decreased endurance, decreased knowledge of use of DME, decreased mobility, difficulty walking, decreased strength, impaired sensation, postural dysfunction, obesity, and pain.   ACTIVITY LIMITATIONS: carrying, lifting, bending, sitting, standing, squatting, stairs, transfers, bed mobility, locomotion level, and caring for others  PARTICIPATION LIMITATIONS: meal prep, cleaning, laundry, shopping, community activity, and occupation  PERSONAL FACTORS: Age, Behavior pattern, Fitness, Time since onset of injury/illness/exacerbation, and 1-2 comorbidities: cardiomyopathy, Oa knees and back, depression/anxiety  are also affecting patient's functional outcome.   REHAB POTENTIAL: Good  CLINICAL DECISION MAKING: Evolving/moderate complexity  EVALUATION COMPLEXITY: Moderate   GOALS: Goals reviewed with patient? Yes  SHORT TERM GOALS: Target date: 06/24/22  Pt will tolerate full aquatic sessions consistently without increase in pain and with improving function to demonstrate good toleration and effectiveness of intervention.    Baseline:TBA Goal status:MET - 06/14/22  2.  Pt will climb stairs using combination of step to and alternating pattern without pausing consistently Baseline: improving - some pauses, but not consistent - 06/19/22 Goal status:ONGOING   3.  Pt will report climbing stairs at home 1-2 x weekly to access bedroom and full bath more regularly. Baseline: <1x weekly Pt reports she has been showering at therapy and has not been climbing stair Goal status: ongoing 07/19/22  4.  Pt will tolerate walking 8-10 continuous minutes in pool to demonstrate improving toleration to activity Baseline: na Goal status: MET - 06/14/22  5.  Pt will improve on 5 X STS test to <or=   18s to demonstrate improving functional lower extremity strength, transitional movements, and balance Baseline:  24.16 at eval;  18.4s on 06/21/22 Goal status: MET 07/19/22 (17.11)  6.  Pt will improve on Tug test to <or= 14s to demonstrate improvement in lower extremity function, mobility and decreased fall risk.  Baseline:  20.90 at eval;  12.91 on 06/21/22 Goal status: MET   LONG TERM GOALS: Target date: 09/13/22  Pt to meet stated Foto Goal of 39% to demonstrate improved perception of ability Baseline: 22% Goal status: Ongoing 06/28/22  2.  Pt will be indep with final HEP's (land and aquatic as appropriate) for continued management of condition  Baseline:  Goal status: in process 07/19/22  3.  Pt will walk in store for up to or >15 mins pushing cart Baseline: rides scooter Goal status: Ongoing -06/28/22  4.  Pt will report decrease in pain with amb to <5/10 to demonstrate improvement management of condition Baseline: 9/10 Goal status: ongoing  06/28/22  5.  Pt will improve on Berg balance test to >/= 50/56 to demonstrate a decrease in fall risk.  Baseline: 35/56 Goal status: in progress 06/28/22 (40/56)  6.  Pt will increase strength of hip by 1 full grade to improve functional mobility. Baseline: see chart Goal status: deferred  (changes measurement tool) 07/19/22  7.Pt will improve strength in all areas listed by 10lb to demonstrate improved overall physical function  Baseline: see chart  Goal Status: NEW   PLAN:  PT FREQUENCY: 1-2x/week  PT DURATION: 8 weeks alternating settings land and aquatics  PLANNED INTERVENTIONS: Therapeutic exercises, Therapeutic activity, Neuromuscular re-education, Balance training, Gait training, Patient/Family education, Self Care, Joint mobilization, Stair training, DME instructions, Aquatic Therapy, Dry Needling, Cryotherapy, Moist heat, Taping, Ultrasound, Biofeedback, Ionotophoresis 4mg /ml Dexamethasone, Manual therapy, and Re-evaluation.  PLAN FOR NEXT SESSION: Progress strength, ROM, balance, gait and pain reduction land and aquatics.  Consider DN.    Riki Altes, PTA  08/07/22 12:02 PM [facility] [address] [place], [place], [address] Phone: [phone]   Fax:  [phone]

## 2022-08-08 ENCOUNTER — Ambulatory Visit (INDEPENDENT_AMBULATORY_CARE_PROVIDER_SITE_OTHER): Payer: Medicare Other | Admitting: Family Medicine

## 2022-08-08 ENCOUNTER — Encounter: Payer: Self-pay | Admitting: Family Medicine

## 2022-08-08 VITALS — BP 132/88 | HR 107 | Ht 64.0 in | Wt 310.0 lb

## 2022-08-08 DIAGNOSIS — G8929 Other chronic pain: Secondary | ICD-10-CM | POA: Diagnosis not present

## 2022-08-08 DIAGNOSIS — M545 Low back pain, unspecified: Secondary | ICD-10-CM

## 2022-08-08 DIAGNOSIS — M47816 Spondylosis without myelopathy or radiculopathy, lumbar region: Secondary | ICD-10-CM | POA: Diagnosis not present

## 2022-08-08 NOTE — Progress Notes (Signed)
I, [name], CMA acting as a scribe for Samantha Graham, MD.  [name] is a 57 y.o. female who presents to [facility] today for f/u LBP w/ L-spine MRI review w/ a hx of fibromyalgia. Pt was last seen by Dr. Denyse Amass on 06/25/22 and a L-spine MRI was ordered, she was prescribed Lyrica, and advised to cont aquatic therapy, completing 16 visits.   Today, pt reports continued lower back pain, mostly right-sided. Some muscle soreness on the left side of the lower back after going to therapy. Aqua Therapy has been very helpful. Has been able to stand up straight for 30 seconds which is a huge improvement. Feeling better emotionally.   Pain is located primarily in the right lower back especially with standing for longer than 60 seconds.   Dx imaging: 07/24/22 L-spine MRI 07/31/20 L-spine MRI             03/08/20 L-spine XR  Pertinent review of systems: no fever or chills  Relevant historical information: Heart failure.  Obesity.  Diabetes.   Exam:  BP 132/88   Pulse (!) 107   Ht 5\' 4"  (1.626 m)   Wt (!) 310 lb (140.6 kg)   SpO2 96%   BMI 53.21 kg/m  General: Well Developed, well nourished, and in no acute distress.   MSK: L-spine: Nontender palpation midline.  Decreased lumbar motion.  Lower extremity strength is intact.    Lab and Radiology Results  EXAM: MRI LUMBAR SPINE WITHOUT CONTRAST   TECHNIQUE: Multiplanar, multisequence MR imaging of the lumbar spine was performed. No intravenous contrast was administered.   COMPARISON:  Lumbar spine MRI 07/31/2020   FINDINGS: Segmentation: Standard; the lowest formed disc space is designated L5-S1.   Alignment: There is levocurvature centered at L3. There is no antero or retrolisthesis.   Vertebrae: Vertebral body heights are preserved. Background marrow signal is heterogeneous throughout but without suspicious signal abnormality. A benign intraosseous hemangioma in the L1 vertebral body is  unchanged. There is no marrow edema.   Conus medullaris and cauda equina: Conus extends to the L1 level. Conus and cauda equina appear normal.   Paraspinal and other soft tissues: The paraspinal soft tissues are unremarkable. Uterine fibroids are partially imaged.   Disc levels:   There is multilevel disc desiccation and narrowing throughout the lumbar spine, most advanced at L3-L4 and eccentric to the right due to the scoliotic curvature.   T12-L1: Mild facet arthropathy without significant spinal canal or neural foraminal stenosis, unchanged.   L1-L2: There is a broad-based central protrusion and mild facet arthropathy without significant spinal canal or neural foraminal stenosis, unchanged.   L2-L3: There is left extraforaminal protrusion which may contact the extraforaminal left L2 nerve root, slightly increased since the prior study. Mild facet arthropathy without significant spinal canal or neural foraminal stenosis.   L3-L4: There is a disc bulge with a superimposed right subarticular zone protrusion and moderate right and mild left facet arthropathy resulting in right subarticular zone narrowing with possible impingement of the traversing right L4 nerve root, and mild right and no significant left neural foraminal stenosis. The right subarticular zone narrowing is increased from prior.   L4-L5: There is a diffuse disc bulge and moderate right worse than left facet arthropathy resulting in left subarticular zone narrowing with contact of the traversing left L5 nerve root, and mild bilateral neural foraminal stenosis. Findings are not significantly changed.   L5-S1: There is a prominent left foraminal/extraforaminal  protrusion and moderate left and mild right facet arthropathy resulting in mild left subarticular zone narrowing without evidence of nerve root impingement, and moderate left and mild right neural foraminal stenosis. Findings are unchanged.    IMPRESSION: 1. Moderate lumbar levocurvature with multilevel disc degeneration most advanced at L3-L4, unchanged. 2. Left extraforaminal protrusion at L2-L3 which may contact the exiting L2 nerve root, increased since 2022. 3. Disc bulge and moderate right facet arthropathy at L3-L4 resulting in right subarticular zone narrowing with possible impingement of the traversing right L4 nerve root, increased since 2022. 4. Disc bulge and moderate right worse than left facet arthropathy at L4-L5 resulting in left subarticular zone narrowing with contact of the traversing left L5 nerve root, unchanged. 5. Prominent left foraminal/extraforaminal protrusion at L5-S1 and moderate left facet arthropathy resulting in mild left subarticular zone narrowing without evidence of nerve root impingement, and moderate left neural foraminal stenosis, unchanged.     Electronically Signed   By: Lesia Hausen M.D.   On: 07/31/2022 12:20   I, [name], personally (independently) visualized and performed the interpretation of the images attached in this note.      Assessment and Plan: 57 y.o. female with chronic low back pain without significant radicular symptoms.  Pain thought to be due predominantly to the degenerative changes seen on her recent MRI.  The most likely will problem is the facet arthritis seen at right L4-5 and also at L3-4.  This looks to be the worst facet joint on the right side lower portion of her back.  Will target this for facet joint injection and then ultimately if needed medial branch block and ablation.  She has other facet joints that could be good targets for injection as she does have some bilateral back pain.  She will check back in a few weeks after her facet injection.  Continue home exercise program and pool-based exercises learned in physical therapy.   PDMP not reviewed this encounter. Orders Placed This Encounter  Procedures   DG FACET JT INJ L /S SINGLE LEVEL RIGHT  W/FL/CT    Standing Status:   Future    Standing Expiration Date:   08/08/2023    Order Specific Question:   Reason for Exam (SYMPTOM  OR DIAGNOSIS REQUIRED)    Answer:   Rt L4-5 facet injection    Order Specific Question:   Is the patient pregnant?    Answer:   No    Order Specific Question:   Preferred Imaging Location?    Answer:   [id number]    Order Specific Question:   Radiology Contrast Protocol - do NOT remove file path    Answer:   [email or web address]   No orders of the defined types were placed in this encounter.    Discussed warning signs or symptoms. Please see discharge instructions. Patient expresses understanding.   The above documentation has been reviewed and is accurate and complete [name], M.D.  Total encounter time 30 minutes including face-to-face time with the patient and, reviewing past medical record, and charting on the date of service.

## 2022-08-08 NOTE — Patient Instructions (Addendum)
Good to see you Ordered the facet injection for [facility]. Their number is [phone]. If you don't hear from them by Monday you can give them a call.  We will be in touch once you get the facet injection

## 2022-08-09 ENCOUNTER — Ambulatory Visit (HOSPITAL_BASED_OUTPATIENT_CLINIC_OR_DEPARTMENT_OTHER): Payer: Medicare Other | Admitting: Physical Therapy

## 2022-08-09 ENCOUNTER — Other Ambulatory Visit: Payer: Self-pay | Admitting: Internal Medicine

## 2022-08-09 ENCOUNTER — Encounter (HOSPITAL_BASED_OUTPATIENT_CLINIC_OR_DEPARTMENT_OTHER): Payer: Self-pay | Admitting: Physical Therapy

## 2022-08-09 DIAGNOSIS — E119 Type 2 diabetes mellitus without complications: Secondary | ICD-10-CM | POA: Diagnosis not present

## 2022-08-09 DIAGNOSIS — M5459 Other low back pain: Secondary | ICD-10-CM

## 2022-08-09 DIAGNOSIS — M6281 Muscle weakness (generalized): Secondary | ICD-10-CM

## 2022-08-09 DIAGNOSIS — G8929 Other chronic pain: Secondary | ICD-10-CM

## 2022-08-09 DIAGNOSIS — M545 Low back pain, unspecified: Secondary | ICD-10-CM | POA: Diagnosis not present

## 2022-08-09 DIAGNOSIS — M25561 Pain in right knee: Secondary | ICD-10-CM | POA: Diagnosis not present

## 2022-08-09 DIAGNOSIS — J301 Allergic rhinitis due to pollen: Secondary | ICD-10-CM

## 2022-08-09 LAB — HM DIABETES EYE EXAM

## 2022-08-09 NOTE — Therapy (Signed)
OUTPATIENT PHYSICAL THERAPY THORACOLUMBAR TREATMENT   Patient Name: [name] MRN: [id number] DOB:02/26/1965, 57 y.o., female Today's Date: 08/09/2022  END OF SESSION:  PT End of Session - 08/09/22 1303     Visit Number 17    Number of Visits 29    Date for PT Re-Evaluation 09/13/22    Authorization Type MCR    Progress Note Due on Visit 19    PT Start Time 1301    PT Stop Time 1341    PT Time Calculation (min) 40 min    Activity Tolerance Patient tolerated treatment well    Behavior During Therapy WFL for tasks assessed/performed               Past Medical History:  Diagnosis Date   ADD (attention deficit disorder)    Anxiety    Arthritis    both knees right worse than left   Carpal tunnel syndrome of right wrist    Depression    Essential hypertension, benign    Gallstones    History of smoking 06/22/2016   Hyperlipidemia associated with type 2 diabetes mellitus (HCC), on Zocor 04/04/2011   Hypertension associated with diabetes (HCC) 06/03/2008   Migraines    Morbid obesity (HCC)    Morbid obesity with BMI of 50.0-59.9, adult (HCC) 07/12/2006   NICM (nonischemic cardiomyopathy) (HCC) 07/12/2006   01/16/18 ECHO:    - Procedure narrative: Transthoracic echocardiography. Image   quality was suboptimal. The study was technically difficult.   Intravenous contrast (Definity) was administered. - Left ventricle: The cavity size was moderately dilated. Wall   thickness was increased in a pattern of mild LVH. Systolic   function was moderately to severely reduced. The estimated   ejection fraction w   Normal coronary arteries 04/17/2016   OSA on CPAP 10/28/2014   Spinal stenosis    back pain   Spondylosis without myelopathy or radiculopathy, lumbar region 12/04/2017   Type 2 diabetes mellitus with hyperglycemia [facility])    Past Surgical History:  Procedure Laterality Date   CHOLECYSTECTOMY     DILATATION & CURETTAGE/HYSTEROSCOPY WITH MYOSURE N/A 10/31/2017   Procedure: DILATATION  & CURETTAGE/HYSTEROSCOPY WITH MYOSURE;  Surgeon: Romualdo Bolk, MD;  Location: [facility];  Service: Gynecology;  Laterality: N/A;   HYSTEROSCOPY WITH D & C N/A 05/07/2022   Procedure: DILATATION AND CURETTAGE /HYSTEROSCOPY;  Surgeon: [name], MD;  Location: [facility] OR;  Service: Gynecology;  Laterality: N/A;   RIGHT/LEFT HEART CATH AND CORONARY ANGIOGRAPHY N/A 04/11/2016   Procedure: Right/Left Heart Cath and Coronary Angiography;  Surgeon: Kathleene Hazel, MD;  Location: [facility] INVASIVE CV LAB;  Service: Cardiovascular;  Laterality: N/A;   sonogram for blood clots     no blockages   Patient Active Problem List   Diagnosis Date Noted   PMB (postmenopausal bleeding) 07/03/2022   Postmenopausal bleeding 05/07/2022   Fear of other medical care 05/06/2022   Primary osteoarthritis of both knees 03/06/2022   Bilateral ovarian cysts 03/06/2022   Major depressive disorder, recurrent episode, severe with anxious distress (HCC) 08/09/2021   Attention deficit hyperactivity disorder (ADHD), predominantly inattentive type 08/09/2021   Need for varicella vaccine 08/09/2021   Urge incontinence 08/01/2021   Tinea corporis 03/14/2021   Hyperlipidemia LDL goal <100 03/01/2021   DDD (degenerative disc disease), lumbar 08/08/2020   Non-seasonal allergic rhinitis due to pollen 07/25/2020   Chronic idiopathic constipation 07/25/2020   Visit for screening mammogram 02/11/2020   Chronic bilateral low back pain without sciatica 02/10/2020  Spondylosis without myelopathy or radiculopathy, lumbar region 12/04/2017   Type 2 diabetes mellitus with hyperglycemia (HCC)    Normal coronary arteries 04/17/2016   OSA on CPAP 10/28/2014   Major depressive disorder 10/28/2014   Chronic systolic CHF (congestive heart failure), NYHA class 3 (HCC) 12/06/2013   Hyperlipidemia associated with type 2 diabetes mellitus (HCC), on Zocor 04/04/2011   Abnormal electrocardiography 01/16/2009   Hypertension associated  with diabetes (HCC) 06/03/2008   Morbid obesity with BMI of 50.0-59.9, adult (HCC) 07/12/2006   NICM (nonischemic cardiomyopathy) (HCC) 07/12/2006   Attention deficit disorder 07/12/2006   Migraine without status migrainosus, not intractable 07/12/2006    PCP: [name], MD   REFERRING PROVIDER: Rodolph Bong, MD   REFERRING DIAG:   Chronic pain of right knee  M54.50,G89.29 (ICD-10-CM) - Chronic bilateral low back pain without sciatica    Rationale for Evaluation and Treatment: Rehabilitation  THERAPY DIAG:  Other low back pain  Chronic pain of right knee  Muscle weakness (generalized)  ONSET DATE: >5  SUBJECTIVE:                                                                                                                                                                                           SUBJECTIVE STATEMENT: Pt reports she went to see Dr. Denyse Amass, "He wants me to get another injection in my back".  Pt reports that her husband has been out of [place] since 6/10, which means she has had to do more around the house (dishes, laundry, etc).  She reports she hasn't been able to get to the pool due to schedule, but also it will mean she has to find someone to get her scooter out of the car.    PERTINENT HISTORY:  [name] 03/19/22: 57 y.o. female with chronic low back pain and chronic right knee pain  . . . bothersome paresthesias sensation bilateral lower extremities.  . . . Right knee pain due to exacerbation of DJD.  . . . Chronic back pain thought to be due to core weakness and facet arthritis changes seen on CT scan.   PAIN:  Are you having pain? Yes: NPRS scale: 8/10 Pain location: LB to bilat hips, Rt shoulder, neck  Pain description: ache, pressure Aggravating factors: standing and moving, walking through home Relieving factors: sitting  PRECAUTIONS: Knee and Back  WEIGHT BEARING RESTRICTIONS: No  FALLS:  Has patient fallen in last 6 months?  No  LIVING ENVIRONMENT: Lives with: lives with their family and lives with their spouse Lives in: House/apartment Stairs: Yes: Internal: 16 steps; on right going up Has following  equipment at home: Dan Humphreys - 4 wheeled and shower chair  OCCUPATION: disabled  PLOF: Needs assistance with ADLs, Needs assistance with homemaking, and Needs assistance with gait  PATIENT GOALS: increased movement without pain, ADL's, cook, climb stairs, go to the store without hurting  NEXT MD VISIT: 6 weeks  OBJECTIVE:   DIAGNOSTIC FINDINGS:  X-ray images right knee: Mild medial compartment DJD.  Mild patellofemoral DJD  CT scan images of lumbar spine: Diffuse degenerative changes present throughout lumbar spine   PATIENT SURVEYS:  FOTO Primary score 22% with goal 39% 06/28/22: 22% 08/07/22: Lumbar: 16%, knee: 29%  SCREENING FOR RED FLAGS: none  COGNITION: Overall cognitive status: Within functional limits for tasks assessed     SENSATION: wfl  POSTURE: increased thoracic kyphosis,right shoulder elevated  PALPATION: No tenderness although difficulty to assess due to body habitus  LUMBAR ROM:   AROM eval 06/28/22 07/19/22  Flexion Mid shin    Extension 0 10d wfl  Right lateral flexion 50% 75% full  Left lateral flexion 50% 75% full  Right rotation 100%    Left rotation 100%     (Blank rows = not tested)  LOWER EXTREMITY ROM:      Limited due to body habitus  LOWER EXTREMITY MMT:    MMT Right eval Left eval 06/28/22 Right / left 07/19/22 Right / Left  Hip flexion 3+ 3+ 4-/5 35.6 / 41.7  Hip extension      Hip abduction    35.9 / 33/9  Hip adduction      Hip internal rotation      Hip external rotation      Knee flexion 4- 4- 4+ / 4+   Knee extension 4+ 4+ 5- / 5- 25.6 / 30.1  Ankle dorsiflexion      Ankle plantarflexion      Ankle inversion      Ankle eversion       (Blank rows = not tested)  LUMBAR SPECIAL TESTS:  Slump test: Negative  FUNCTIONAL TESTS:  5 times sit  to stand: 24.16 seated on scooter Timed up and go (TUG): 20.90 from seated on scooter Berg: 35/56  06/21/22:  5x STS:  18.40 seated on scooter    TUG 12.91 from seated on scooter  06/28/22: Sharlene Motts: 40/56    07/19/22 TUG: 14.85      5 x STS: 17.11  GAIT: Distance walked: 30 ft Assistive device utilized: None Level of assistance:  none Comments: shoulder retraction, antalgic gait pattern  TODAY'S TREATMENT:       6/14: Pt seen for aquatic therapy today.  Treatment took place in water 3.5-4.75 ft in depth at the [facility] pool. Temp of water was 91.  Pt entered/exited the pool via stairs independently with bilat rail.  * without support:  walking forward/ backward with reciprocal arm swing *side step with shoulder add/abd with rainbow hand floats -> yellow hand floats * bow and arrow with same side step back x 10 each side, demo and cues for technique  * at >4 ft:  Lt forward step ups x 10; Lt lateral step ups x 10; forward step downs/retro step up x 5 each LE * return to walking forward/ backward  * wood chop holding small yellow ball x 10 each side, cues for technique * staggered stance with weighted ball (11#) pushes again wall  x 10   6/11: Seated HSS (4" step) x30sec each Standing side bend 10sec x3 ea Standing HR 2x10 Side stepping at  railing Standing lumbar flexion/extension  Standing ITB stretch x30sec each L stretch at rail  Seated assisted partial sit up- x10 Standing/seated PNF chops x10 standing, then had to sit for second set.   6/7 Pt seen for aquatic therapy today.  Treatment took place in water 3.5-4.75 ft in depth at the [facility] pool. Temp of water was 91.  Pt entered/exited the pool via stairs independently with bilat rail.  * without support:  walking forward/ backward  *side step with shoulder add/abd without resistance * Rt levator stretch x 20s * bow and arrow with same side step back x 8 each side, demo and heavy cues for  technique  * marching with alternating arm row with yellow hand floats on surface, forward/ backward * TrA set with solid noodle pull down x 10 wide stance * UE on yellow hand floats:  3 way tap x 5 each side, single leg clams x 8 each side; leg swings into hip flex/ext  10 each * straddling yellow noodle:  cycling  6/4: Seated ball roll at table to L side x10 5" hold  Seated adductor squeeze with trA- 5" 1x10 (4" step under feet ) Seated clam RTB x20 Seated HSS (4" step) 2x30sec each Standing side bend 10sec x3 ea Seated cat/cow- gentle (5" hold x5ea)                                                                                                                              PATIENT EDUCATION:  Education details: Aquatic therapy progressions/ modifications Person educated: Patient Education method: Explanation Education comprehension: verbalized understanding  HOME EXERCISE PROGRAM: Pt will climb stairs 1 -2 x weekly 06/28/22  ASSESSMENT:  CLINICAL IMPRESSION: Pt tolerated new exercises without increase in pain.  She reports reduction of back pain from 8/10 to 4/10 when in the water.  Encouraged pt to consider trying to climb her stairs at home to bathroom with shower at least 1x/wk.  Plan to create aquatic HEP that she can take with her to local pool. May trial pt pushing transport chair to/from pool instead of using scooter next visit. Pt has partially met her goals.   OBJECTIVE IMPAIRMENTS: decreased activity tolerance, decreased balance, decreased endurance, decreased knowledge of use of DME, decreased mobility, difficulty walking, decreased strength, impaired sensation, postural dysfunction, obesity, and pain.   ACTIVITY LIMITATIONS: carrying, lifting, bending, sitting, standing, squatting, stairs, transfers, bed mobility, locomotion level, and caring for others  PARTICIPATION LIMITATIONS: meal prep, cleaning, laundry, shopping, community activity, and occupation  PERSONAL  FACTORS: Age, Behavior pattern, Fitness, Time since onset of injury/illness/exacerbation, and 1-2 comorbidities: cardiomyopathy, Oa knees and back, depression/anxiety  are also affecting patient's functional outcome.   REHAB POTENTIAL: Good  CLINICAL DECISION MAKING: Evolving/moderate complexity  EVALUATION COMPLEXITY: Moderate   GOALS: Goals reviewed with patient? Yes  SHORT TERM GOALS: Target date: 06/24/22  Pt will tolerate full aquatic sessions consistently without increase in pain and with improving  function to demonstrate good toleration and effectiveness of intervention.   Baseline:TBA Goal status:MET - 06/14/22  2.  Pt will climb stairs using combination of step to and alternating pattern without pausing consistently Baseline: improving - some pauses, but not consistent - 06/19/22 Goal status:ONGOING   3.  Pt will report climbing stairs at home 1-2 x weekly to access bedroom and full bath more regularly. Baseline: <1x weekly Pt reports she has been showering at therapy and has not been climbing stair Goal status: ongoing 07/19/22  4.  Pt will tolerate walking 8-10 continuous minutes in pool to demonstrate improving toleration to activity Baseline: na Goal status: MET - 06/14/22  5.  Pt will improve on 5 X STS test to <or=   18s to demonstrate improving functional lower extremity strength, transitional movements, and balance Baseline: 24.16 at eval;  18.4s on 06/21/22 Goal status: MET 07/19/22 (17.11)  6.  Pt will improve on Tug test to <or= 14s to demonstrate improvement in lower extremity function, mobility and decreased fall risk.  Baseline:  20.90 at eval;  12.91 on 06/21/22 Goal status: MET   LONG TERM GOALS: Target date: 09/13/22  Pt to meet stated Foto Goal of 39% to demonstrate improved perception of ability Baseline: 22% Goal status: Ongoing 06/28/22  2.  Pt will be indep with final HEP's (land and aquatic as appropriate) for continued management of condition   Baseline:  Goal status: in process 07/19/22  3.  Pt will walk in store for up to or >15 mins pushing cart Baseline: rides scooter Goal status: Ongoing -06/28/22  4.  Pt will report decrease in pain with amb to <5/10 to demonstrate improvement management of condition Baseline: 9/10 Goal status: ongoing  06/28/22  5.  Pt will improve on Berg balance test to >/= 50/56 to demonstrate a decrease in fall risk.  Baseline: 35/56 Goal status: in progress 06/28/22 (40/56)  6.  Pt will increase strength of hip by 1 full grade to improve functional mobility. Baseline: see chart Goal status: deferred (changes measurement tool) 07/19/22  7.Pt will improve strength in all areas listed by 10lb to demonstrate improved overall physical function  Baseline: see chart  Goal Status: NEW   PLAN:  PT FREQUENCY: 1-2x/week  PT DURATION: 8 weeks alternating settings land and aquatics  PLANNED INTERVENTIONS: Therapeutic exercises, Therapeutic activity, Neuromuscular re-education, Balance training, Gait training, Patient/Family education, Self Care, Joint mobilization, Stair training, DME instructions, Aquatic Therapy, Dry Needling, Cryotherapy, Moist heat, Taping, Ultrasound, Biofeedback, Ionotophoresis 4mg /ml Dexamethasone, Manual therapy, and Re-evaluation.  PLAN FOR NEXT SESSION: Progress strength, ROM, balance, gait and pain reduction land and aquatics.  Consider DN.   Mayer Camel, PTA 08/09/22 5:01 PM [facility] [address] [place], [place], [address] Phone: [phone]   Fax:  [phone]

## 2022-08-12 ENCOUNTER — Ambulatory Visit: Payer: Self-pay | Admitting: Licensed Clinical Social Worker

## 2022-08-12 NOTE — Telephone Encounter (Signed)
Call returned to pt. She voiced extreme frustration and anger @ not having her call returned and concerns addressed since she reached out to [place] on 6/11.  Pt also stated that she has called multiple times since last visit on 5/8 and has not received a response. She stated that she has been trying to obtain her AVS information and also wanted to ask questions about the IUD placement procedure which is scheduled to see if this is really the best option for her. Pt further stated that this is unacceptable care and that she is about to call [facility] to report that this office does not care about their patients. Per chart review, there have not been any telephone encounters entered to EMR other than her call originating on 6/11. I acknowledged pt's feelings of frustration and anger and apologized for the delay. I stated that I wanted to address her concerns today to the best of my ability. She stated that for the past month she has experienced episodes of waking in the middle of the night with excruciation period -like pains. The pain is so severe that she cannot talk to anyone nor have anyone speak to her. She has tried Ibuprofen for this pain and it helps somewhat. She voiced her questions regarding IUD for her problem and also wonders if she may have endometriosis because her husband's research revealed that her symptoms are similar to someone with that problem. I informed pt that Dr. Odie Sera operative report from March did not mention endometriosis. I advised her to discuss all of her concerns and questions with Dr. Alysia Penna on 6/20 prior to the IUD insertion. Together they can decide what will be best for her and if she does not want the IUD, she does not have to receive it. Pt voiced understanding of my recommendations and expressed appreciation for the time spent talking with her. At her request, I assured her that I would take her concerns and negative experience forward to the appropriate person. I also stated that  our office does want to provide excellent care to our patients and appreciated her bringing her experience to our attention.

## 2022-08-12 NOTE — Progress Notes (Signed)
Patient was in today for what was scheduled to be a medicare wellness exam but patient wanted to see Dr. Yetta Barre instead. Was unable to get AWV questions answered as patient spent an hour explaining what has been going on, scheduled appt with Dr. Yetta Barre for 6/18. Will need to be rescheduled for a PHONE AWV visit.

## 2022-08-12 NOTE — Patient Instructions (Signed)
Social Work Visit Information  Thank you for taking time to visit with me today. Please don't hesitate to contact me if I can be of assistance to you.   Following are the goals we discussed today:   Goals Addressed             This Visit's Progress    Care Coordination Activities for mental & physical health support       Activities in order to accomplish goals.   I am glad we were able to get your appointment scheduled with [facility] [phone]   your next appointment is July 12th for medication management with Cyndi Lennert and  July 16th with your therapist Genia Del  Continue with compliance of taking medication prescribed by Doctor and keep all medical appointments Keep medical appointment we reviewed today for physical therapy, If You wanted to find out more information about your Medicaid ( per [facility] we will need to talk to your local DSS worker [name]) I spoke with [name] at the [employer]  [phone] for Medicare options.  At this time [name] is the only one that has all of your providers in network this includes $320 for flex dollars for Benefit card; , dental ,  0 doctor visit, 0 for therapy ; Silver Sneakers , foot care &   purse devise with life station ( "fallen and can't get up"). You and I have a phone appointment with [name] for a 3 -way call during our next phone visit        Our next appointment is by telephone on 08/20/22 at 3:15   Please call the care guide team at [phone] if you need to cancel or reschedule your appointment.   If you or anyone you know are experiencing a Mental Health or Behavioral Health Crisis or need someone to talk to, please call the Suicide and Crisis Lifeline: 988 call the [place] National Suicide Prevention Lifeline: 780-518-0336 or TTY: 209-299-6623 TTY (862) 660-4435) to talk to a trained counselor call 1-800-273-TALK (toll free, 24 hour hotline) go to [facility] [address], [place] [phone])   Patient verbalizes understanding of instructions and care plan provided today and agrees to view in MyChart. Active MyChart status and patient understanding of how to access instructions and care plan via MyChart confirmed with patient.       [name], LCSW Social Work Care Coordination  [facility] [employer] [phone]

## 2022-08-12 NOTE — Patient Outreach (Signed)
  Care Coordination  Follow Up Visit Note   08/12/2022 Name: [name] MRN: [id number] DOB: 1966-01-09  [name] is a 57 y.o. year old female who sees Samantha Grandchild, MD for primary care. I spoke with  [name] by phone today.  What matters to the patients health and wellness today?  Running into consistent  barriers with trying to manager her health Patient continues to express frustration with managing her health care needs due to no return calls with trying to get answers prior to [facility] appointment June 20th.  RN from [facility] office will call patient today per message sent via instant chat.  These barriers exacerbate her symptoms of anxiety and depression.  Support provided today to assist with coordinating health needs. Next encounter with LCSW will focus on Medicare with enhanced benefits to assist with meeting needs related to SDOH    Goals Addressed             This Visit's Progress    Care Coordination Activities for mental & physical health support       Activities in order to accomplish goals.   I am glad we were able to get your appointment scheduled with [facility] [phone]   your next appointment is July 12th for medication management with [name] and  July 16th with your therapist [name]  Continue with compliance of taking medication prescribed by Doctor and keep all medical appointments Keep medical appointment we reviewed today for physical therapy, If You wanted to find out more information about your Medicaid ( per [facility] we will need to talk to your local DSS worker [name]) I spoke with [name] at the [employer]  [phone] for Medicare options.  At this time [name] is the only one that has all of your providers in network this includes $320 for flex dollars for Benefit card; , dental ,  0 doctor visit, 0 for therapy ; Silver Sneakers , foot care &   purse devise with life station ( "fallen and can't get up"). You and I have a  phone appointment with [name] for a 3 -way call during our next phone visit       SDOH assessments and interventions completed:  Yes  SDOH Interventions Today    Flowsheet Row Most Recent Value  SDOH Interventions   Housing Interventions Intervention Not Indicated       Care Coordination Interventions:  Yes, provided  Interventions Today    Flowsheet Row Most Recent Value  Chronic Disease   Chronic disease during today's visit Hypertension (HTN), Diabetes, Congestive Heart Failure (CHF)  General Interventions   General Interventions Discussed/Reviewed Level of Care, Communication with  Communication with PCP/Specialists  [OBGYN office,  [employer]]  Level of Care Personal Care Services  [declined PCS at this time]  Education Interventions   Education Provided Provided Education  Provided Verbal Education On Mental Health/Coping with Illness  Mental Health Interventions   Mental Health Discussed/Reviewed Mental Health Reviewed, Anxiety, Depression  [solution focused, emotional support, problem solving]       Follow up plan: Follow up call scheduled for 08/20/22    Encounter Outcome:  Pt. Visit Completed   [name], LCSW Social Work Care Coordination  [facility] [employer] [employer] [phone]

## 2022-08-13 ENCOUNTER — Encounter: Payer: Self-pay | Admitting: Internal Medicine

## 2022-08-13 ENCOUNTER — Ambulatory Visit (INDEPENDENT_AMBULATORY_CARE_PROVIDER_SITE_OTHER): Payer: Medicare Other | Admitting: Internal Medicine

## 2022-08-13 VITALS — BP 128/88 | HR 90 | Temp 98.1°F | Resp 16 | Ht 64.0 in | Wt 305.0 lb

## 2022-08-13 DIAGNOSIS — E1165 Type 2 diabetes mellitus with hyperglycemia: Secondary | ICD-10-CM | POA: Diagnosis not present

## 2022-08-13 DIAGNOSIS — K625 Hemorrhage of anus and rectum: Secondary | ICD-10-CM

## 2022-08-13 DIAGNOSIS — E1159 Type 2 diabetes mellitus with other circulatory complications: Secondary | ICD-10-CM

## 2022-08-13 DIAGNOSIS — I152 Hypertension secondary to endocrine disorders: Secondary | ICD-10-CM | POA: Diagnosis not present

## 2022-08-13 DIAGNOSIS — M5136 Other intervertebral disc degeneration, lumbar region: Secondary | ICD-10-CM | POA: Diagnosis not present

## 2022-08-13 DIAGNOSIS — I5022 Chronic systolic (congestive) heart failure: Secondary | ICD-10-CM

## 2022-08-13 DIAGNOSIS — E1169 Type 2 diabetes mellitus with other specified complication: Secondary | ICD-10-CM | POA: Diagnosis not present

## 2022-08-13 DIAGNOSIS — E785 Hyperlipidemia, unspecified: Secondary | ICD-10-CM | POA: Diagnosis not present

## 2022-08-13 DIAGNOSIS — M51369 Other intervertebral disc degeneration, lumbar region without mention of lumbar back pain or lower extremity pain: Secondary | ICD-10-CM

## 2022-08-13 DIAGNOSIS — Z7985 Long-term (current) use of injectable non-insulin antidiabetic drugs: Secondary | ICD-10-CM

## 2022-08-13 DIAGNOSIS — L719 Rosacea, unspecified: Secondary | ICD-10-CM | POA: Diagnosis not present

## 2022-08-13 DIAGNOSIS — K5904 Chronic idiopathic constipation: Secondary | ICD-10-CM

## 2022-08-13 LAB — LIPID PANEL
Cholesterol: 194 mg/dL (ref 0–200)
HDL: 48.9 mg/dL (ref >39.00)
LDL Cholesterol: 117 mg/dL — ABNORMAL HIGH (ref 0–99)
NonHDL: 144.61
Total CHOL/HDL Ratio: 4
Triglycerides: 138 mg/dL (ref 0.0–149.0)
VLDL: 27.6 mg/dL (ref 0.0–40.0)

## 2022-08-13 LAB — HEMOGLOBIN A1C: Hgb A1c MFr Bld: 6.8 % — ABNORMAL HIGH (ref 4.6–6.5)

## 2022-08-13 LAB — BASIC METABOLIC PANEL
BUN: 16 mg/dL (ref 6–23)
CO2: 29 mEq/L (ref 19–32)
Calcium: 9.5 mg/dL (ref 8.4–10.5)
Chloride: 101 mEq/L (ref 96–112)
Creatinine, Ser: 0.82 mg/dL (ref 0.40–1.20)
GFR: 79.52 mL/min (ref >60.00)
Glucose, Bld: 133 mg/dL — ABNORMAL HIGH (ref 70–99)
Potassium: 3.9 mEq/L (ref 3.5–5.1)
Sodium: 139 mEq/L (ref 135–145)

## 2022-08-13 LAB — HEPATIC FUNCTION PANEL
ALT: 23 U/L (ref 0–35)
AST: 21 U/L (ref 0–37)
Albumin: 4.2 g/dL (ref 3.5–5.2)
Alkaline Phosphatase: 32 U/L — ABNORMAL LOW (ref 39–117)
Bilirubin, Direct: 0.2 mg/dL (ref 0.0–0.3)
Total Bilirubin: 1.3 mg/dL — ABNORMAL HIGH (ref 0.2–1.2)
Total Protein: 7.9 g/dL (ref 6.0–8.3)

## 2022-08-13 LAB — CBC WITH DIFFERENTIAL/PLATELET
Basophils Absolute: 0.1 10*3/uL (ref 0.0–0.1)
Basophils Relative: 0.8 % (ref 0.0–3.0)
Eosinophils Absolute: 0.1 10*3/uL (ref 0.0–0.7)
Eosinophils Relative: 1.4 % (ref 0.0–5.0)
HCT: 49.1 % — ABNORMAL HIGH (ref 36.0–46.0)
Hemoglobin: 15.8 g/dL — ABNORMAL HIGH (ref 12.0–15.0)
Lymphocytes Relative: 17.5 % (ref 12.0–46.0)
Lymphs Abs: 1.9 10*3/uL (ref 0.7–4.0)
MCHC: 32.3 g/dL (ref 30.0–36.0)
MCV: 85.1 fl (ref 78.0–100.0)
Monocytes Absolute: 0.8 10*3/uL (ref 0.1–1.0)
Monocytes Relative: 7.6 % (ref 3.0–12.0)
Neutro Abs: 7.9 10*3/uL — ABNORMAL HIGH (ref 1.4–7.7)
Neutrophils Relative %: 72.7 % (ref 43.0–77.0)
Platelets: 315 10*3/uL (ref 150.0–400.0)
RBC: 5.77 Mil/uL — ABNORMAL HIGH (ref 3.87–5.11)
RDW: 14.7 % (ref 11.5–15.5)
WBC: 10.8 10*3/uL — ABNORMAL HIGH (ref 4.0–10.5)

## 2022-08-13 MED ORDER — TIZANIDINE HCL 2 MG PO TABS
2.0000 mg | ORAL_TABLET | Freq: Three times a day (TID) | ORAL | 0 refills | Status: DC | PRN
Start: 2022-08-13 — End: 2022-11-09

## 2022-08-13 MED ORDER — METRONIDAZOLE 1 % EX GEL
Freq: Every day | CUTANEOUS | 2 refills | Status: DC
Start: 2022-08-13 — End: 2022-12-05

## 2022-08-13 MED ORDER — LINACLOTIDE 145 MCG PO CAPS
145.0000 ug | ORAL_CAPSULE | Freq: Every day | ORAL | 1 refills | Status: DC
Start: 2022-08-13 — End: 2022-12-05

## 2022-08-13 MED ORDER — TIRZEPATIDE 2.5 MG/0.5ML ~~LOC~~ SOAJ
2.5000 mg | SUBCUTANEOUS | 0 refills | Status: DC
Start: 2022-08-13 — End: 2022-08-15

## 2022-08-13 NOTE — Patient Instructions (Signed)
Rosacea Rosacea is a long-term (chronic) condition that affects the skin of the face, including the cheeks, nose, forehead, and chin. This condition can also affect the eyes. Rosacea causes blood vessels near the surface of the skin to enlarge, which results in redness. What are the causes? The cause of this condition is not known. Certain triggers can make rosacea worse, including: Exercise. Sunlight. Very hot or cold temperatures. Hot or spicy foods and drinks. Drinking alcohol. Stress. Taking blood pressure medicine. Long-term use of topical steroids on the face. What increases the risk? You are more likely to develop this condition if you: Are older than 57 years of age. Are a woman. Have light-colored skin (light complexion). Have a family history of rosacea. What are the signs or symptoms? Symptoms of this condition include: Redness of the face. Red bumps or pimples on the face. A red, enlarged nose. Blushing easily. Red lines on the skin. Eye problems such as: Irritated, burning, or itchy feeling in the eyes. Swollen eyelids. Drainage from the eyes. Feeling like there is something in your eye. How is this diagnosed? This condition is diagnosed with a medical history and physical exam. How is this treated? There is no cure for this condition, but treatment can help to control your symptoms. Your health care provider may recommend that you see a skin specialist (dermatologist). Treatment may include: Medicines that are applied to the skin or taken by mouth (orally). This can include antibiotic medicines. Laser treatment to improve the appearance of the skin. Surgery. This is rare. Your health care provider will also recommend the best way to take care of your skin. Even after your skin improves, you will likely need to continue treatment to prevent your rosacea from coming back. Follow these instructions at home: Skin care Take care of your skin as told by your health  care provider. You may be told to do these things: Wash your skin gently two or more times each day. Use mild soap. Use a sunscreen or sunblock with SPF 30 or greater. Use gentle cosmetics that are meant for sensitive skin. Shave with an electric shaver instead of a blade. Lifestyle Try to keep track of what foods trigger this condition. Avoid any triggers. These may include: Spicy foods. Seafood. Cheese. Hot liquids. Nuts. Chocolate. Iodized salt. Do not drink alcohol. Avoid extremely cold or hot temperatures. Try to reduce your stress. If you need help, talk with your health care provider. When you exercise, do these things to stay cool: Limit sun exposure to your face. Use a fan. Do shorter and more frequent intervals of exercise. General instructions Take and apply over-the-counter and prescription medicines only as told by your health care provider. If you were prescribed antibiotics, apply it or take them as told by your health care provider. Do not stop using the antibiotic even if your condition improves. If your eyelids are affected, apply warm compresses to them. Do this as told by your health care provider. Keep all follow-up visits. Contact a health care provider if: Your symptoms get worse. Your symptoms do not improve after 2 months of treatment. You have new symptoms. You have any changes in vision or you have problems with your eyes, such as redness or itching. You feel depressed. You lose your appetite. You have trouble concentrating. Summary Rosacea is a long-term (chronic) condition that affects the skin of the face, including the cheeks, nose, forehead, and chin. Take care of your skin as told by your health care   provider. Take and apply over-the-counter and prescription medicines only as told by your health care provider. Contact a health care provider if your symptoms get worse or if you have any changes in vision or other problems with your eyes, such as  redness or itching. Keep all follow-up visits. This information is not intended to replace advice given to you by your health care provider. Make sure you discuss any questions you have with your health care provider. Document Revised: 04/04/2021 Document Reviewed: 04/04/2021 Elsevier Patient Education  2024 [employer]  

## 2022-08-13 NOTE — Progress Notes (Signed)
Subjective:  Patient ID: [name], female    DOB: [birth year]  Age: 57 y.o. MRN: [id number]  CC: Congestive Heart Failure, Diabetes, Hyperlipidemia, Rash, and Back Pain      HPI Shynia Proske presents for f/up ----  Discussed the use of AI scribe software for clinical note transcription with the patient, who gave verbal consent to proceed.  History of Present Illness   The patient, with a history of chronic LBP and neck pain. They request a muscle relaxer to manage this pain and prevent it from hindering their therapy progress.  They also report a new facial rash, characterized as red with white heads, initially on one side and now spreading to the other. They inquire about the use of ketoconazole for this rash.  The patient has a history of DDD, for which they recently had an MRI. They have had multiple procedures and cortisone shots in the past, with limited success. They are scheduled for another cortisone shot and request a prescription for a MR.  They express concerns about constipation, describing their stools as hard, like rocks, and requiring manual assistance for evacuation. They report occasional blood in the stool and request a referral for a colonoscopy, as it has been some time since their last one and they have a family history of colitis.  The patient also mentions a history of heart failure, with no LE edema. They are currently undergoing pool therapy, which they believe is improving their standing tolerance.       Outpatient Medications Prior to Visit  Medication Sig Dispense Refill   carvedilol (COREG) 25 MG tablet TAKE 1 TABLET (25 MG TOTAL) BY MOUTH TWICE A DAY WITH MEALS 180 tablet 3   furosemide (LASIX) 80 MG tablet Take 1 tablet (80 mg total) by mouth daily. 90 tablet 3   Misc. Devices (FOAM CUSHION THERAPEUTIC RING) MISC Use while sitting 1 each 0   NON FORMULARY Pt uses cpap nightly     potassium chloride SA (KLOR-CON M20) 20 MEQ tablet TAKE 2 TABLETS BY  MOUTH DAILY 180 tablet 2   pregabalin (LYRICA) 75 MG capsule Take 1 capsule (75 mg total) by mouth 2 (two) times daily as needed. 60 capsule 3   sacubitril-valsartan (ENTRESTO) 97-103 MG Take 1 tablet by mouth 2 (two) times daily. 180 tablet 2   simvastatin (ZOCOR) 20 MG tablet TAKE 1 TABLET BY MOUTH EVERY DAY 90 tablet 0   XIGDUO XR 11-998 MG TB24 TAKE 1 TABLET BY MOUTH EVERY DAY 90 tablet 1   ibuprofen (ADVIL) 800 MG tablet Take 1 tablet (800 mg total) by mouth 3 (three) times daily with meals as needed for headache, moderate pain or cramping. 30 tablet 3   linaclotide (LINZESS) 145 MCG CAPS capsule Take 145 mcg by mouth daily before breakfast.     No facility-administered medications prior to visit.    ROS Review of Systems  Constitutional:  Positive for fatigue. Negative for appetite change, diaphoresis and unexpected weight change.  HENT: Negative.    Eyes: Negative.   Respiratory:  Negative for cough, chest tightness, shortness of breath and wheezing.   Cardiovascular:  Negative for chest pain, palpitations and leg swelling.  Gastrointestinal:  Positive for anal bleeding and constipation. Negative for abdominal distention, abdominal pain, blood in stool, diarrhea, nausea and vomiting.  Endocrine: Negative.   Genitourinary: Negative.  Negative for difficulty urinating.  Musculoskeletal:  Positive for back pain and neck pain. Negative for myalgias and neck stiffness.  Skin:  Positive for color change and rash.  Neurological: Negative.  Negative for dizziness, weakness and light-headedness.  Hematological: Negative.  Negative for adenopathy. Does not bruise/bleed easily.  Psychiatric/Behavioral:  Positive for dysphoric mood. Negative for behavioral problems, confusion, self-injury, sleep disturbance and suicidal ideas. The patient is nervous/anxious.     Objective:  BP 128/88 (BP Location: Right Arm, Patient Position: Sitting, Cuff Size: Large)   Pulse 90   Temp 98.1 F (36.7 C)  (Oral)   Resp 16   Ht 5\' 4"  (1.626 m)   Wt (!) 305 lb (138.3 kg)   SpO2 94%   BMI 52.35 kg/m   BP Readings from Last 3 Encounters:  08/13/22 128/88  08/12/22 (!) 138/96  08/08/22 132/88    Wt Readings from Last 3 Encounters:  08/13/22 (!) 305 lb (138.3 kg)  08/12/22 (!) 313 lb (142 kg)  08/08/22 (!) 310 lb (140.6 kg)    Physical Exam Vitals reviewed.  Constitutional:      General: She is not in acute distress.    Appearance: She is obese. She is ill-appearing. She is not toxic-appearing or diaphoretic.  HENT:     Nose: Nose normal.     Mouth/Throat:     Mouth: Mucous membranes are moist.  Eyes:     General: No scleral icterus.    Conjunctiva/sclera: Conjunctivae normal.  Cardiovascular:     Rate and Rhythm: Normal rate and regular rhythm.     Heart sounds: No murmur heard. Pulmonary:     Effort: Pulmonary effort is normal.     Breath sounds: No stridor. No wheezing, rhonchi or rales.  Abdominal:     General: Abdomen is protuberant. Bowel sounds are normal. There is no distension.     Palpations: Abdomen is soft. There is no fluid wave, hepatomegaly, splenomegaly or mass.     Tenderness: There is no abdominal tenderness.  Musculoskeletal:        General: Normal range of motion.     Cervical back: Neck supple.     Right lower leg: No edema.     Left lower leg: No edema.  Lymphadenopathy:     Cervical: No cervical adenopathy.  Skin:    General: Skin is dry.     Findings: Erythema and rash present.  Neurological:     General: No focal deficit present.     Mental Status: She is alert. Mental status is at baseline.  Psychiatric:        Mood and Affect: Mood normal.        Behavior: Behavior normal.     Lab Results  Component Value Date   WBC 10.8 (H) 08/13/2022   HGB 15.8 (H) 08/13/2022   HCT 49.1 (H) 08/13/2022   PLT 315.0 08/13/2022   GLUCOSE 133 (H) 08/13/2022   CHOL 194 08/13/2022   TRIG 138.0 08/13/2022   HDL 48.90 08/13/2022   LDLDIRECT 143.8  04/03/2011   LDLCALC 117 (H) 08/13/2022   ALT 23 08/13/2022   AST 21 08/13/2022   NA 139 08/13/2022   K 3.9 08/13/2022   CL 101 08/13/2022   CREATININE 0.82 08/13/2022   BUN 16 08/13/2022   CO2 29 08/13/2022   TSH 2.130 05/16/2022   INR 1.2 02/25/2022   HGBA1C 6.8 (H) 08/13/2022   MICROALBUR 1.3 03/06/2022    MR Lumbar Spine Wo Contrast  Result Date: 07/31/2022 CLINICAL DATA:  Severe low back pain for 10 years with numbness in both thighs, hands, and feet. EXAM: MRI LUMBAR  SPINE WITHOUT CONTRAST TECHNIQUE: Multiplanar, multisequence MR imaging of the lumbar spine was performed. No intravenous contrast was administered. COMPARISON:  Lumbar spine MRI 07/31/2020 FINDINGS: Segmentation: Standard; the lowest formed disc space is designated L5-S1. Alignment: There is levocurvature centered at L3. There is no antero or retrolisthesis. Vertebrae: Vertebral body heights are preserved. Background marrow signal is heterogeneous throughout but without suspicious signal abnormality. A benign intraosseous hemangioma in the L1 vertebral body is unchanged. There is no marrow edema. Conus medullaris and cauda equina: Conus extends to the L1 level. Conus and cauda equina appear normal. Paraspinal and other soft tissues: The paraspinal soft tissues are unremarkable. Uterine fibroids are partially imaged. Disc levels: There is multilevel disc desiccation and narrowing throughout the lumbar spine, most advanced at L3-L4 and eccentric to the right due to the scoliotic curvature. T12-L1: Mild facet arthropathy without significant spinal canal or neural foraminal stenosis, unchanged. L1-L2: There is a broad-based central protrusion and mild facet arthropathy without significant spinal canal or neural foraminal stenosis, unchanged. L2-L3: There is left extraforaminal protrusion which may contact the extraforaminal left L2 nerve root, slightly increased since the prior study. Mild facet arthropathy without significant spinal  canal or neural foraminal stenosis. L3-L4: There is a disc bulge with a superimposed right subarticular zone protrusion and moderate right and mild left facet arthropathy resulting in right subarticular zone narrowing with possible impingement of the traversing right L4 nerve root, and mild right and no significant left neural foraminal stenosis. The right subarticular zone narrowing is increased from prior. L4-L5: There is a diffuse disc bulge and moderate right worse than left facet arthropathy resulting in left subarticular zone narrowing with contact of the traversing left L5 nerve root, and mild bilateral neural foraminal stenosis. Findings are not significantly changed. L5-S1: There is a prominent left foraminal/extraforaminal protrusion and moderate left and mild right facet arthropathy resulting in mild left subarticular zone narrowing without evidence of nerve root impingement, and moderate left and mild right neural foraminal stenosis. Findings are unchanged. IMPRESSION: 1. Moderate lumbar levocurvature with multilevel disc degeneration most advanced at L3-L4, unchanged. 2. Left extraforaminal protrusion at L2-L3 which may contact the exiting L2 nerve root, increased since 2022. 3. Disc bulge and moderate right facet arthropathy at L3-L4 resulting in right subarticular zone narrowing with possible impingement of the traversing right L4 nerve root, increased since 2022. 4. Disc bulge and moderate right worse than left facet arthropathy at L4-L5 resulting in left subarticular zone narrowing with contact of the traversing left L5 nerve root, unchanged. 5. Prominent left foraminal/extraforaminal protrusion at L5-S1 and moderate left facet arthropathy resulting in mild left subarticular zone narrowing without evidence of nerve root impingement, and moderate left neural foraminal stenosis, unchanged. Electronically Signed   By: Lesia Hausen M.D.   On: 07/31/2022 12:20    Assessment & Plan:   Hyperlipidemia  associated with type 2 diabetes mellitus (HCC), on Zocor -     Lipid panel; Future -     Hepatic function panel; Future  Hyperlipidemia LDL goal <100- LDL goal achieved. Doing well on the statin  -     Lipid panel; Future -     Hepatic function panel; Future  Hypertension associated with diabetes (HCC) -     Basic metabolic panel; Future -     CBC with Differential/Platelet; Future -     Hepatic function panel; Future  Chronic systolic CHF (congestive heart failure), NYHA class 3 (HCC)- She has a normal volume status.  Type  2 diabetes mellitus with hyperglycemia, without long-term current use of insulin (HCC) -     Hemoglobin A1c; Future -     Tirzepatide; Inject 2.5 mg into the skin once a week.  Dispense: 2 mL; Refill: 0  Rosacea, acne -     metroNIDAZOLE; Apply topically daily.  Dispense: 45 g; Refill: 2  DDD (degenerative disc disease), lumbar -     tiZANidine HCl; Take 1 tablet (2 mg total) by mouth every 8 (eight) hours as needed for muscle spasms.  Dispense: 270 tablet; Refill: 0  Chronic idiopathic constipation -     linaCLOtide; Take 1 capsule (145 mcg total) by mouth daily before breakfast.  Dispense: 90 capsule; Refill: 1  BRBPR (bright red blood per rectum) -     Ambulatory referral to Gastroenterology     Follow-up: Return in about 3 months (around 11/13/2022).  Sanda Linger, MD

## 2022-08-14 ENCOUNTER — Encounter (HOSPITAL_BASED_OUTPATIENT_CLINIC_OR_DEPARTMENT_OTHER): Payer: Self-pay

## 2022-08-14 ENCOUNTER — Ambulatory Visit (HOSPITAL_BASED_OUTPATIENT_CLINIC_OR_DEPARTMENT_OTHER): Payer: Medicare Other

## 2022-08-14 DIAGNOSIS — M6281 Muscle weakness (generalized): Secondary | ICD-10-CM

## 2022-08-14 DIAGNOSIS — M5459 Other low back pain: Secondary | ICD-10-CM

## 2022-08-14 DIAGNOSIS — M25561 Pain in right knee: Secondary | ICD-10-CM | POA: Diagnosis not present

## 2022-08-14 DIAGNOSIS — G8929 Other chronic pain: Secondary | ICD-10-CM

## 2022-08-14 DIAGNOSIS — M545 Low back pain, unspecified: Secondary | ICD-10-CM | POA: Diagnosis not present

## 2022-08-14 NOTE — Therapy (Signed)
OUTPATIENT PHYSICAL THERAPY THORACOLUMBAR TREATMENT   Patient Name: [name] MRN: [id number] DOB:[birth year], 57 y.o., female Today's Date: 08/14/2022  END OF SESSION:  PT End of Session - 08/14/22 1305     Visit Number 18    Number of Visits 29    Date for PT Re-Evaluation 09/13/22    Authorization Type MCR    Progress Note Due on Visit 19    PT Start Time 1017    PT Stop Time 1102    PT Time Calculation (min) 45 min    Activity Tolerance Patient tolerated treatment well    Behavior During Therapy WFL for tasks assessed/performed               Past Medical History:  Diagnosis Date   ADD (attention deficit disorder)    Anxiety    Arthritis    both knees right worse than left   Carpal tunnel syndrome of right wrist    Depression    Essential hypertension, benign    Gallstones    History of smoking 06/22/2016   Hyperlipidemia associated with type 2 diabetes mellitus (HCC), on Zocor 04/04/2011   Hypertension associated with diabetes (HCC) 06/03/2008   Migraines    Morbid obesity (HCC)    Morbid obesity with BMI of 50.0-59.9, adult (HCC) 07/12/2006   NICM (nonischemic cardiomyopathy) (HCC) 07/12/2006   01/16/18 ECHO:    - Procedure narrative: Transthoracic echocardiography. Image   quality was suboptimal. The study was technically difficult.   Intravenous contrast (Definity) was administered. - Left ventricle: The cavity size was moderately dilated. Wall   thickness was increased in a pattern of mild LVH. Systolic   function was moderately to severely reduced. The estimated   ejection fraction w   Normal coronary arteries 04/17/2016   OSA on CPAP 10/28/2014   Spinal stenosis    back pain   Spondylosis without myelopathy or radiculopathy, lumbar region 12/04/2017   Type 2 diabetes mellitus with hyperglycemia [facility])    Past Surgical History:  Procedure Laterality Date   CHOLECYSTECTOMY     DILATATION & CURETTAGE/HYSTEROSCOPY WITH MYOSURE N/A 10/31/2017   Procedure: DILATATION  & CURETTAGE/HYSTEROSCOPY WITH MYOSURE;  Surgeon: Romualdo Bolk, MD;  Location: [facility];  Service: Gynecology;  Laterality: N/A;   HYSTEROSCOPY WITH D & C N/A 05/07/2022   Procedure: DILATATION AND CURETTAGE /HYSTEROSCOPY;  Surgeon: [name], MD;  Location: [facility] OR;  Service: Gynecology;  Laterality: N/A;   RIGHT/LEFT HEART CATH AND CORONARY ANGIOGRAPHY N/A 04/11/2016   Procedure: Right/Left Heart Cath and Coronary Angiography;  Surgeon: Kathleene Hazel, MD;  Location: [facility] INVASIVE CV LAB;  Service: Cardiovascular;  Laterality: N/A;   sonogram for blood clots     no blockages   Patient Active Problem List   Diagnosis Date Noted   Rosacea, acne 08/13/2022   BRBPR (bright red blood per rectum) 08/13/2022   Primary osteoarthritis of both knees 03/06/2022   Bilateral ovarian cysts 03/06/2022   Major depressive disorder, recurrent episode, severe with anxious distress (HCC) 08/09/2021   Attention deficit hyperactivity disorder (ADHD), predominantly inattentive type 08/09/2021   Need for varicella vaccine 08/09/2021   Urge incontinence 08/01/2021   Tinea corporis 03/14/2021   Hyperlipidemia LDL goal <100 03/01/2021   DDD (degenerative disc disease), lumbar 08/08/2020   Non-seasonal allergic rhinitis due to pollen 07/25/2020   Chronic idiopathic constipation 07/25/2020   Visit for screening mammogram 02/11/2020   Chronic bilateral low back pain without sciatica 02/10/2020   Spondylosis without myelopathy  or radiculopathy, lumbar region 12/04/2017   Type 2 diabetes mellitus with hyperglycemia (HCC)    Normal coronary arteries 04/17/2016   OSA on CPAP 10/28/2014   Chronic systolic CHF (congestive heart failure), NYHA class 3 (HCC) 12/06/2013   Hyperlipidemia associated with type 2 diabetes mellitus (HCC), on Zocor 04/04/2011   Hypertension associated with diabetes (HCC) 06/03/2008   Morbid obesity with BMI of 50.0-59.9, adult (HCC) 07/12/2006   NICM (nonischemic  cardiomyopathy) (HCC) 07/12/2006   Migraine without status migrainosus, not intractable 07/12/2006    PCP: [name], MD   REFERRING PROVIDER: Rodolph Bong, MD   REFERRING DIAG:   Chronic pain of right knee  M54.50,G89.29 (ICD-10-CM) - Chronic bilateral low back pain without sciatica    Rationale for Evaluation and Treatment: Rehabilitation  THERAPY DIAG:  Other low back pain  Chronic pain of right knee  Muscle weakness (generalized)  Chronic bilateral low back pain without sciatica  ONSET DATE: >5  SUBJECTIVE:                                                                                                                                                                                           SUBJECTIVE STATEMENT: Pt reports continued increased pain since she has had to do more around the house. "By the end of the night, I'm not okay." Pt did climb stair at home to help her son. States she has pain with stair climbing, especially R knee. "I think the shot wore off".  Has injection scheduled for her R low back in mid July.    PERTINENT HISTORY:  [name] 03/19/22: 57 y.o. female with chronic low back pain and chronic right knee pain  . . . bothersome paresthesias sensation bilateral lower extremities.  . . . Right knee pain due to exacerbation of DJD.  . . . Chronic back pain thought to be due to core weakness and facet arthritis changes seen on CT scan.   PAIN:  Are you having pain? Yes: NPRS scale: 8/10 Pain location: LB to bilat hips, Rt shoulder, neck  Pain description: ache, pressure Aggravating factors: standing and moving, walking through home Relieving factors: sitting  PRECAUTIONS: Knee and Back  WEIGHT BEARING RESTRICTIONS: No  FALLS:  Has patient fallen in last 6 months? No  LIVING ENVIRONMENT: Lives with: lives with their family and lives with their spouse Lives in: House/apartment Stairs: Yes: Internal: 16 steps; on right going up Has  following equipment at home: Dan Humphreys - 4 wheeled and shower chair  OCCUPATION: disabled  PLOF: Needs assistance with ADLs, Needs assistance with homemaking, and Needs assistance with gait  PATIENT GOALS: increased movement without pain, ADL's, cook, climb stairs, go to the store without hurting  NEXT MD VISIT: 6 weeks  OBJECTIVE:   DIAGNOSTIC FINDINGS:  X-ray images right knee: Mild medial compartment DJD.  Mild patellofemoral DJD  CT scan images of lumbar spine: Diffuse degenerative changes present throughout lumbar spine   PATIENT SURVEYS:  FOTO Primary score 22% with goal 39% 06/28/22: 22% 08/07/22: Lumbar: 16%, knee: 29%  SCREENING FOR RED FLAGS: none  COGNITION: Overall cognitive status: Within functional limits for tasks assessed     SENSATION: wfl  POSTURE: increased thoracic kyphosis,right shoulder elevated  PALPATION: No tenderness although difficulty to assess due to body habitus  LUMBAR ROM:   AROM eval 06/28/22 07/19/22  Flexion Mid shin    Extension 0 10d wfl  Right lateral flexion 50% 75% full  Left lateral flexion 50% 75% full  Right rotation 100%    Left rotation 100%     (Blank rows = not tested)  LOWER EXTREMITY ROM:      Limited due to body habitus  LOWER EXTREMITY MMT:    MMT Right eval Left eval 06/28/22 Right / left 07/19/22 Right / Left  Hip flexion 3+ 3+ 4-/5 35.6 / 41.7  Hip extension      Hip abduction    35.9 / 33/9  Hip adduction      Hip internal rotation      Hip external rotation      Knee flexion 4- 4- 4+ / 4+   Knee extension 4+ 4+ 5- / 5- 25.6 / 30.1  Ankle dorsiflexion      Ankle plantarflexion      Ankle inversion      Ankle eversion       (Blank rows = not tested)  LUMBAR SPECIAL TESTS:  Slump test: Negative  FUNCTIONAL TESTS:  5 times sit to stand: 24.16 seated on scooter Timed up and go (TUG): 20.90 from seated on scooter Berg: 35/56  06/21/22:  5x STS:  18.40 seated on scooter    TUG 12.91 from seated on  scooter  06/28/22: Sharlene Motts: 40/56    07/19/22 TUG: 14.85      5 x STS: 17.11  GAIT: Distance walked: 30 ft Assistive device utilized: None Level of assistance:  none Comments: shoulder retraction, antalgic gait pattern  TODAY'S TREATMENT:         6/19: Gait training with FWW in hall (1/2 hall and back) Gait with transport wc- 350ft (full lap around clinic. Frequent standing breaks but no seated breaks.)  Standing side bend 10sec x3 ea Standing HR 2x10 Seated marching x20 Standing lumbar flexion/extension x10 Seated assisted partial sit up- x10 LAQ 2# x15ea   6/14: Pt seen for aquatic therapy today.  Treatment took place in water 3.5-4.75 ft in depth at the [facility] pool. Temp of water was 91.  Pt entered/exited the pool via stairs independently with bilat rail.  * without support:  walking forward/ backward with reciprocal arm swing *side step with shoulder add/abd with rainbow hand floats -> yellow hand floats * bow and arrow with same side step back x 10 each side, demo and cues for technique  * at >4 ft:  Lt forward step ups x 10; Lt lateral step ups x 10; forward step downs/retro step up x 5 each LE * return to walking forward/ backward  * wood chop holding small yellow ball x 10 each side, cues for technique * staggered stance with weighted ball (  11#) pushes again wall  x 10   6/11: Seated HSS (4" step) x30sec each Standing side bend 10sec x3 ea Standing HR 2x10 Side stepping at railing Standing lumbar flexion/extension  Standing ITB stretch x30sec each L stretch at rail  Seated assisted partial sit up- x10 Standing/seated PNF chops x10 standing, then had to sit for second set.   6/7 Pt seen for aquatic therapy today.  Treatment took place in water 3.5-4.75 ft in depth at the [facility] pool. Temp of water was 91.  Pt entered/exited the pool via stairs independently with bilat rail.  * without support:  walking forward/ backward  *side step  with shoulder add/abd without resistance * Rt levator stretch x 20s * bow and arrow with same side step back x 8 each side, demo and heavy cues for technique  * marching with alternating arm row with yellow hand floats on surface, forward/ backward * TrA set with solid noodle pull down x 10 wide stance * UE on yellow hand floats:  3 way tap x 5 each side, single leg clams x 8 each side; leg swings into hip flex/ext  10 each * straddling yellow noodle:  cycling   PATIENT EDUCATION:  Education details: Aquatic therapy progressions/ modifications Person educated: Patient Education method: Explanation Education comprehension: verbalized understanding  HOME EXERCISE PROGRAM: Pt will climb stairs 1 -2 x weekly 06/28/22  ASSESSMENT:  CLINICAL IMPRESSION: Trialed ambulation with front wheeled walker in hallway today with overall good tolerance.  She was able to ambulate for increased distance using transport chair in case of seated rest break.  Patient was able to complete 300 feet with standing breaks only.  Added LAQ strengthening due to reports leg weakness impacting her standing tolerance.  She did not complain of knee pain with this.  Educated patient about activity level at home and managing her symptoms.  Suggested patient use transport chair to ambulate to pool for aquatic session.  Patient is hesitant to do this due to her fatigue level following her showers post aquatic therapy.  She is unsure if she would be able to ambulate after her session pushing wheelchair.  Will continue to work on improving ambulation patient endurance and overall strength and conditioning.  Patient requires PN next visit.  OBJECTIVE IMPAIRMENTS: decreased activity tolerance, decreased balance, decreased endurance, decreased knowledge of use of DME, decreased mobility, difficulty walking, decreased strength, impaired sensation, postural dysfunction, obesity, and pain.   ACTIVITY LIMITATIONS: carrying, lifting,  bending, sitting, standing, squatting, stairs, transfers, bed mobility, locomotion level, and caring for others  PARTICIPATION LIMITATIONS: meal prep, cleaning, laundry, shopping, community activity, and occupation  PERSONAL FACTORS: Age, Behavior pattern, Fitness, Time since onset of injury/illness/exacerbation, and 1-2 comorbidities: cardiomyopathy, Oa knees and back, depression/anxiety  are also affecting patient's functional outcome.   REHAB POTENTIAL: Good  CLINICAL DECISION MAKING: Evolving/moderate complexity  EVALUATION COMPLEXITY: Moderate   GOALS: Goals reviewed with patient? Yes  SHORT TERM GOALS: Target date: 06/24/22  Pt will tolerate full aquatic sessions consistently without increase in pain and with improving function to demonstrate good toleration and effectiveness of intervention.   Baseline:TBA Goal status:MET - 06/14/22  2.  Pt will climb stairs using combination of step to and alternating pattern without pausing consistently Baseline: improving - some pauses, but not consistent - 06/19/22 Goal status:ONGOING   3.  Pt will report climbing stairs at home 1-2 x weekly to access bedroom and full bath more regularly. Baseline: <1x weekly Pt reports she has  been showering at therapy and has not been climbing stair Goal status: ongoing 07/19/22  4.  Pt will tolerate walking 8-10 continuous minutes in pool to demonstrate improving toleration to activity Baseline: na Goal status: MET - 06/14/22  5.  Pt will improve on 5 X STS test to <or=   18s to demonstrate improving functional lower extremity strength, transitional movements, and balance Baseline: 24.16 at eval;  18.4s on 06/21/22 Goal status: MET 07/19/22 (17.11)  6.  Pt will improve on Tug test to <or= 14s to demonstrate improvement in lower extremity function, mobility and decreased fall risk.  Baseline:  20.90 at eval;  12.91 on 06/21/22 Goal status: MET   LONG TERM GOALS: Target date: 09/13/22  Pt to meet stated  Foto Goal of 39% to demonstrate improved perception of ability Baseline: 22% Goal status: Ongoing 06/28/22  2.  Pt will be indep with final HEP's (land and aquatic as appropriate) for continued management of condition  Baseline:  Goal status: in process 07/19/22  3.  Pt will walk in store for up to or >15 mins pushing cart Baseline: rides scooter Goal status: Ongoing -06/28/22  4.  Pt will report decrease in pain with amb to <5/10 to demonstrate improvement management of condition Baseline: 9/10 Goal status: ongoing  06/28/22  5.  Pt will improve on Berg balance test to >/= 50/56 to demonstrate a decrease in fall risk.  Baseline: 35/56 Goal status: in progress 06/28/22 (40/56)  6.  Pt will increase strength of hip by 1 full grade to improve functional mobility. Baseline: see chart Goal status: deferred (changes measurement tool) 07/19/22  7.Pt will improve strength in all areas listed by 10lb to demonstrate improved overall physical function  Baseline: see chart  Goal Status: NEW   PLAN:  PT FREQUENCY: 1-2x/week  PT DURATION: 8 weeks alternating settings land and aquatics  PLANNED INTERVENTIONS: Therapeutic exercises, Therapeutic activity, Neuromuscular re-education, Balance training, Gait training, Patient/Family education, Self Care, Joint mobilization, Stair training, DME instructions, Aquatic Therapy, Dry Needling, Cryotherapy, Moist heat, Taping, Ultrasound, Biofeedback, Ionotophoresis 4mg /ml Dexamethasone, Manual therapy, and Re-evaluation.  PLAN FOR NEXT SESSION: Progress strength, ROM, balance, gait and pain reduction land and aquatics.  Consider DN.   Riki Altes, PTA  08/14/22 1:07 PM [facility] [address] [place], [place], [address] Phone: [phone]   Fax:  [phone]

## 2022-08-15 ENCOUNTER — Ambulatory Visit: Payer: Medicare Other | Admitting: Obstetrics and Gynecology

## 2022-08-15 ENCOUNTER — Other Ambulatory Visit: Payer: Self-pay | Admitting: Internal Medicine

## 2022-08-15 DIAGNOSIS — E1165 Type 2 diabetes mellitus with hyperglycemia: Secondary | ICD-10-CM

## 2022-08-16 ENCOUNTER — Encounter (HOSPITAL_BASED_OUTPATIENT_CLINIC_OR_DEPARTMENT_OTHER): Payer: Self-pay | Admitting: Physical Therapy

## 2022-08-16 ENCOUNTER — Ambulatory Visit (HOSPITAL_BASED_OUTPATIENT_CLINIC_OR_DEPARTMENT_OTHER): Payer: Medicare Other | Admitting: Physical Therapy

## 2022-08-16 DIAGNOSIS — G8929 Other chronic pain: Secondary | ICD-10-CM | POA: Diagnosis not present

## 2022-08-16 DIAGNOSIS — M25561 Pain in right knee: Secondary | ICD-10-CM | POA: Diagnosis not present

## 2022-08-16 DIAGNOSIS — M6281 Muscle weakness (generalized): Secondary | ICD-10-CM | POA: Diagnosis not present

## 2022-08-16 DIAGNOSIS — M545 Low back pain, unspecified: Secondary | ICD-10-CM | POA: Diagnosis not present

## 2022-08-16 DIAGNOSIS — M5459 Other low back pain: Secondary | ICD-10-CM

## 2022-08-16 NOTE — Therapy (Signed)
OUTPATIENT PHYSICAL THERAPY THORACOLUMBAR TREATMENT   Patient Name: [name] MRN: [id number] DOB:03-01-1965, 57 y.o., female Today's Date: 08/16/2022  END OF SESSION:  PT End of Session - 08/16/22 1135     Visit Number 19    Number of Visits 29    Date for PT Re-Evaluation 09/13/22    Authorization Type MCR    Progress Note Due on Visit 23    PT Start Time 1115    PT Stop Time 1158    PT Time Calculation (min) 43 min    Activity Tolerance Patient tolerated treatment well    Behavior During Therapy WFL for tasks assessed/performed               Past Medical History:  Diagnosis Date   ADD (attention deficit disorder)    Anxiety    Arthritis    both knees right worse than left   Carpal tunnel syndrome of right wrist    Depression    Essential hypertension, benign    Gallstones    History of smoking 06/22/2016   Hyperlipidemia associated with type 2 diabetes mellitus (HCC), on Zocor 04/04/2011   Hypertension associated with diabetes (HCC) 06/03/2008   Migraines    Morbid obesity (HCC)    Morbid obesity with BMI of 50.0-59.9, adult (HCC) 07/12/2006   NICM (nonischemic cardiomyopathy) (HCC) 07/12/2006   01/16/18 ECHO:    - Procedure narrative: Transthoracic echocardiography. Image   quality was suboptimal. The study was technically difficult.   Intravenous contrast (Definity) was administered. - Left ventricle: The cavity size was moderately dilated. Wall   thickness was increased in a pattern of mild LVH. Systolic   function was moderately to severely reduced. The estimated   ejection fraction w   Normal coronary arteries 04/17/2016   OSA on CPAP 10/28/2014   Spinal stenosis    back pain   Spondylosis without myelopathy or radiculopathy, lumbar region 12/04/2017   Type 2 diabetes mellitus with hyperglycemia [facility])    Past Surgical History:  Procedure Laterality Date   CHOLECYSTECTOMY     DILATATION & CURETTAGE/HYSTEROSCOPY WITH MYOSURE N/A 10/31/2017   Procedure: DILATATION  & CURETTAGE/HYSTEROSCOPY WITH MYOSURE;  Surgeon: Romualdo Bolk, MD;  Location: [facility];  Service: Gynecology;  Laterality: N/A;   HYSTEROSCOPY WITH D & C N/A 05/07/2022   Procedure: DILATATION AND CURETTAGE /HYSTEROSCOPY;  Surgeon: [name], MD;  Location: [facility] OR;  Service: Gynecology;  Laterality: N/A;   RIGHT/LEFT HEART CATH AND CORONARY ANGIOGRAPHY N/A 04/11/2016   Procedure: Right/Left Heart Cath and Coronary Angiography;  Surgeon: Kathleene Hazel, MD;  Location: [facility] INVASIVE CV LAB;  Service: Cardiovascular;  Laterality: N/A;   sonogram for blood clots     no blockages   Patient Active Problem List   Diagnosis Date Noted   Rosacea, acne 08/13/2022   BRBPR (bright red blood per rectum) 08/13/2022   Primary osteoarthritis of both knees 03/06/2022   Bilateral ovarian cysts 03/06/2022   Major depressive disorder, recurrent episode, severe with anxious distress (HCC) 08/09/2021   Attention deficit hyperactivity disorder (ADHD), predominantly inattentive type 08/09/2021   Need for varicella vaccine 08/09/2021   Urge incontinence 08/01/2021   Tinea corporis 03/14/2021   Hyperlipidemia LDL goal <100 03/01/2021   DDD (degenerative disc disease), lumbar 08/08/2020   Non-seasonal allergic rhinitis due to pollen 07/25/2020   Chronic idiopathic constipation 07/25/2020   Visit for screening mammogram 02/11/2020   Chronic bilateral low back pain without sciatica 02/10/2020   Spondylosis without myelopathy  or radiculopathy, lumbar region 12/04/2017   Type 2 diabetes mellitus with hyperglycemia (HCC)    Normal coronary arteries 04/17/2016   OSA on CPAP 10/28/2014   Chronic systolic CHF (congestive heart failure), NYHA class 3 (HCC) 12/06/2013   Hyperlipidemia associated with type 2 diabetes mellitus (HCC), on Zocor 04/04/2011   Hypertension associated with diabetes (HCC) 06/03/2008   Morbid obesity with BMI of 50.0-59.9, adult (HCC) 07/12/2006   NICM (nonischemic  cardiomyopathy) (HCC) 07/12/2006   Migraine without status migrainosus, not intractable 07/12/2006    PCP: [name], MD   REFERRING PROVIDER: Rodolph Bong, MD   REFERRING DIAG:   Chronic pain of right knee  M54.50,G89.29 (ICD-10-CM) - Chronic bilateral low back pain without sciatica    Rationale for Evaluation and Treatment: Rehabilitation  THERAPY DIAG:  Other low back pain  Chronic pain of right knee  Muscle weakness (generalized)  ONSET DATE: >5  SUBJECTIVE:                                                                                                                                                                                           SUBJECTIVE STATEMENT: Pt states she thought she was going to be crippled after last land session, but she wasn't.  Her husband is out of country and she has had to do more around the house.  She reports she has not attempted steps at home due to fear.    PERTINENT HISTORY:  [name] 03/19/22: 57 y.o. female with chronic low back pain and chronic right knee pain  . . . bothersome paresthesias sensation bilateral lower extremities.  . . . Right knee pain due to exacerbation of DJD.  . . . Chronic back pain thought to be due to core weakness and facet arthritis changes seen on CT scan.   PAIN:  Are you having pain? Yes: NPRS scale: 8/10 Pain location: LB to bilat hips, Rt shoulder, neck  Pain description: ache, pressure Aggravating factors: standing and moving, walking through home Relieving factors: sitting  PRECAUTIONS: Knee and Back  WEIGHT BEARING RESTRICTIONS: No  FALLS:  Has patient fallen in last 6 months? No  LIVING ENVIRONMENT: Lives with: lives with their family and lives with their spouse Lives in: House/apartment Stairs: Yes: Internal: 16 steps; on right going up Has following equipment at home: Dan Humphreys - 4 wheeled and shower chair  OCCUPATION: disabled  PLOF: Needs assistance with ADLs, Needs  assistance with homemaking, and Needs assistance with gait  PATIENT GOALS: increased movement without pain, ADL's, cook, climb stairs, go to the store without hurting  NEXT MD VISIT: 6 weeks  OBJECTIVE:   DIAGNOSTIC FINDINGS:  X-ray images right knee: Mild medial compartment DJD.  Mild patellofemoral DJD  CT scan images of lumbar spine: Diffuse degenerative changes present throughout lumbar spine   PATIENT SURVEYS:  FOTO Primary score 22% with goal 39% 06/28/22: 22% 08/07/22: Lumbar: 16%, knee: 29%  SCREENING FOR RED FLAGS: none  COGNITION: Overall cognitive status: Within functional limits for tasks assessed     SENSATION: wfl  POSTURE: increased thoracic kyphosis,right shoulder elevated  PALPATION: No tenderness although difficulty to assess due to body habitus  LUMBAR ROM:   AROM eval 06/28/22 07/19/22  Flexion Mid shin    Extension 0 10d wfl  Right lateral flexion 50% 75% full  Left lateral flexion 50% 75% full  Right rotation 100%    Left rotation 100%     (Blank rows = not tested)  LOWER EXTREMITY ROM:      Limited due to body habitus  LOWER EXTREMITY MMT:    MMT Right eval Left eval 06/28/22 Right / left 07/19/22 Right / Left  Hip flexion 3+ 3+ 4-/5 35.6 / 41.7  Hip extension      Hip abduction    35.9 / 33/9  Hip adduction      Hip internal rotation      Hip external rotation      Knee flexion 4- 4- 4+ / 4+   Knee extension 4+ 4+ 5- / 5- 25.6 / 30.1  Ankle dorsiflexion      Ankle plantarflexion      Ankle inversion      Ankle eversion       (Blank rows = not tested)  LUMBAR SPECIAL TESTS:  Slump test: Negative  FUNCTIONAL TESTS:  5 times sit to stand: 24.16 seated on scooter Timed up and go (TUG): 20.90 from seated on scooter Berg: 35/56  06/21/22:  5x STS:  18.40 seated on scooter    TUG 12.91 from seated on scooter  06/28/22: Sharlene Motts: 40/56    07/19/22 TUG: 14.85      5 x STS: 17.11  GAIT: Distance walked: 30 ft Assistive device  utilized: None Level of assistance:  none Comments: shoulder retraction, antalgic gait pattern  TODAY'S TREATMENT:       6/21: * without support:  walking forward/ backward with reciprocal arm swing *side step with shoulder add/abd without and with yellow hand floats * forward marching and forward walking kicks * UE on yellow hand floats:  leg swings into hip flex/ext and hip abdct/ addct; heel raises; squats (with vertical trunk)  * Lt forward step ups x 10 with BUE on rails * UE on blue hand floats:  knee tucks ->  side to side pendulums x 4 each;  front to back pendulums x 4 with cues and demo for improved technique * return to walking forward/ backward    6/19: Gait training with FWW in hall (1/2 hall and back) Gait with transport wc- 360ft (full lap around clinic. Frequent standing breaks but no seated breaks.)  Standing side bend 10sec x3 ea Standing HR 2x10 Seated marching x20 Standing lumbar flexion/extension x10 Seated assisted partial sit up- x10 LAQ 2# x15ea   6/14: Pt seen for aquatic therapy today.  Treatment took place in water 3.5-4.75 ft in depth at the [facility] pool. Temp of water was 91.  Pt entered/exited the pool via stairs independently with bilat rail.  * without support:  walking forward/ backward with reciprocal arm swing *side step with shoulder add/abd  with rainbow hand floats -> yellow hand floats * bow and arrow with same side step back x 10 each side, demo and cues for technique  * at >4 ft:  Lt forward step ups x 10; Lt lateral step ups x 10; forward step downs/retro step up x 5 each LE * return to walking forward/ backward  * wood chop holding small yellow ball x 10 each side, cues for technique * staggered stance with weighted ball (11#) pushes again wall  x 10   6/11: Seated HSS (4" step) x30sec each Standing side bend 10sec x3 ea Standing HR 2x10 Side stepping at railing Standing lumbar flexion/extension  Standing ITB stretch  x30sec each L stretch at rail  Seated assisted partial sit up- x10 Standing/seated PNF chops x10 standing, then had to sit for second set.   6/7 Pt seen for aquatic therapy today.  Treatment took place in water 3.5-4.75 ft in depth at the [facility] pool. Temp of water was 91.  Pt entered/exited the pool via stairs independently with bilat rail.  * without support:  walking forward/ backward  *side step with shoulder add/abd without resistance * Rt levator stretch x 20s * bow and arrow with same side step back x 8 each side, demo and heavy cues for technique  * marching with alternating arm row with yellow hand floats on surface, forward/ backward * TrA set with solid noodle pull down x 10 wide stance * UE on yellow hand floats:  3 way tap x 5 each side, single leg clams x 8 each side; leg swings into hip flex/ext  10 each * straddling yellow noodle:  cycling   PATIENT EDUCATION:  Education details: Aquatic therapy progressions/ modifications Person educated: Patient Education method: Explanation Education comprehension: verbalized understanding  HOME EXERCISE PROGRAM: Pt will climb stairs 1 -2 x weekly 06/28/22  ASSESSMENT:  CLINICAL IMPRESSION: Trialed higher level core exercise in deeper water with good tolerance. Continued encouragement given to pt to utilize stairs at home for use of shower, and to transition pt from utilizing scooter, to using her rollator to foster more independence at home and in the community. Patient continues to be hesitant due to her fatigue level following her showers post therapy.  Will continue to work on improving ambulation patient endurance and overall strength and conditioning. Progressing gradually towards remaining goals. Therapist to ck LTG 5 / 6 if time allows next visit.    OBJECTIVE IMPAIRMENTS: decreased activity tolerance, decreased balance, decreased endurance, decreased knowledge of use of DME, decreased mobility, difficulty  walking, decreased strength, impaired sensation, postural dysfunction, obesity, and pain.   ACTIVITY LIMITATIONS: carrying, lifting, bending, sitting, standing, squatting, stairs, transfers, bed mobility, locomotion level, and caring for others  PARTICIPATION LIMITATIONS: meal prep, cleaning, laundry, shopping, community activity, and occupation  PERSONAL FACTORS: Age, Behavior pattern, Fitness, Time since onset of injury/illness/exacerbation, and 1-2 comorbidities: cardiomyopathy, Oa knees and back, depression/anxiety  are also affecting patient's functional outcome.   REHAB POTENTIAL: Good  CLINICAL DECISION MAKING: Evolving/moderate complexity  EVALUATION COMPLEXITY: Moderate   GOALS: Goals reviewed with patient? Yes  SHORT TERM GOALS: Target date: 06/24/22  Pt will tolerate full aquatic sessions consistently without increase in pain and with improving function to demonstrate good toleration and effectiveness of intervention.   Baseline:TBA Goal status:MET - 06/14/22  2.  Pt will climb stairs using combination of step to and alternating pattern without pausing consistently Baseline: improving - some pauses, but not consistent - 06/19/22 Goal status:ONGOING  3.  Pt will report climbing stairs at home 1-2 x weekly to access bedroom and full bath more regularly. Baseline: <1x weekly Pt reports she has been showering at therapy and has not been climbing stair Goal status: ongoing 07/19/22  4.  Pt will tolerate walking 8-10 continuous minutes in pool to demonstrate improving toleration to activity Baseline: na Goal status: MET - 06/14/22  5.  Pt will improve on 5 X STS test to <or=   18s to demonstrate improving functional lower extremity strength, transitional movements, and balance Baseline: 24.16 at eval;  18.4s on 06/21/22 Goal status: MET 07/19/22 (17.11)  6.  Pt will improve on Tug test to <or= 14s to demonstrate improvement in lower extremity function, mobility and decreased  fall risk.  Baseline:  20.90 at eval;  12.91 on 06/21/22 Goal status: MET   LONG TERM GOALS: Target date: 09/13/22  Pt to meet stated Foto Goal of 39% to demonstrate improved perception of ability Baseline: 22% Goal status: Ongoing 06/28/22  2.  Pt will be indep with final HEP's (land and aquatic as appropriate) for continued management of condition  Baseline:  Goal status: in process 07/19/22  3.  Pt will walk in store for up to or >15 mins pushing cart Baseline: rides scooter Goal status: Ongoing -06/28/22  4.  Pt will report decrease in pain with amb to <5/10 to demonstrate improvement management of condition Baseline: 9/10 Goal status: ongoing  06/28/22  5.  Pt will improve on Berg balance test to >/= 50/56 to demonstrate a decrease in fall risk.  Baseline: 35/56 Goal status: in progress 06/28/22 (40/56)  6.  Pt will increase strength of hip by 1 full grade to improve functional mobility. Baseline: see chart Goal status: deferred (changes measurement tool) 07/19/22  7.Pt will improve strength in all areas listed by 10lb to demonstrate improved overall physical function  Baseline: see chart  Goal Status: NEW   PLAN:  PT FREQUENCY: 1-2x/week  PT DURATION: 8 weeks alternating settings land and aquatics  PLANNED INTERVENTIONS: Therapeutic exercises, Therapeutic activity, Neuromuscular re-education, Balance training, Gait training, Patient/Family education, Self Care, Joint mobilization, Stair training, DME instructions, Aquatic Therapy, Dry Needling, Cryotherapy, Moist heat, Taping, Ultrasound, Biofeedback, Ionotophoresis 4mg /ml Dexamethasone, Manual therapy, and Re-evaluation.  PLAN FOR NEXT SESSION: Progress strength, ROM, balance, gait and pain reduction land and aquatics.  Encourage use of community pool in between therapy sessions, with use of rollator to/from car to facility.   Mayer Camel, PTA 08/16/22 12:34 PM [facility] [address] [place], [place], [address] Phone: [phone]   Fax:  [phone]

## 2022-08-20 ENCOUNTER — Ambulatory Visit: Payer: Self-pay | Admitting: Licensed Clinical Social Worker

## 2022-08-20 NOTE — Patient Outreach (Signed)
  Care Coordination  Follow Up Visit Note   08/20/2022 Name: [name] MRN: [id number] DOB: Apr 17, 1965  [name] is a 57 y.o. year old female who sees [name], MD for primary care. I spoke with  [name] by phone today.  What matters to the patients health and wellness today?  Managing health and mental health while husband is out of the county   Patient continues to have difficulty managing health due to symptoms of anxiety and depression. Reports being over whelmed with managing daily activities and difficulty with memory. She is making progress with her goal by continuing her PT weekly.  We continue to work to find a mental health provider that will meet her needs.   Goals Addressed             This Visit's Progress    Care Coordination Activities for mental & physical health support       Activities in order to accomplish goals.   I am glad we were able to get your appointment scheduled with [facility] [phone]   your next appointment is July 12th at 9:40 for medication management with Cyndi Lennert and  July 16th with your therapist Genia Del  Continue with compliance of taking medication prescribed by Doctor and keep all medical appointments Keep medical appointment we reviewed today for physical therapy, You have decided to put this on hold to talk with [name] at the [facility]  [phone] for Medicare options.  At this time [name] is the only one that has all of your providers in network this includes $320 for flex dollars for Benefit card; , dental ,  0 doctor visit, 0 for therapy ; Silver Sneakers , foot care &   purse devise with life station ( "fallen and can't get up").        SDOH assessments and interventions completed:  No   Care Coordination Interventions:  Yes, provided  Interventions Today    Flowsheet Row Most Recent Value  Chronic Disease   Chronic disease during today's visit Hypertension (HTN), Diabetes, Congestive  Heart Failure (CHF)  General Interventions   General Interventions Discussed/Reviewed General Interventions Reviewed  [reviewed upcoming appointments and barriers to appointments]  Education Interventions   Education Provided Provided Education  Provided Verbal Education On Mental Health/Coping with Illness  Mental Health Interventions   Mental Health Discussed/Reviewed Mental Health Reviewed, Depression, Coping Strategies, Crisis  [active listening, emotional support, solution focused, problem solving]  Safety Interventions   Safety Discussed/Reviewed Safety Reviewed       Follow up plan: Follow up call scheduled for 08/28/22    Encounter Outcome:  Pt. Visit Completed   [name], LCSW Social Work Care Coordination  [name] [employer] [phone]

## 2022-08-20 NOTE — Patient Instructions (Signed)
Social Work Visit Information  Thank you for taking time to visit with me today. Please don't hesitate to contact me if I can be of assistance to you.   Following are the goals we discussed today:   Goals Addressed             This Visit's Progress    Care Coordination Activities for mental & physical health support       Activities in order to accomplish goals.   I am glad we were able to get your appointment scheduled with [facility] [phone]   your next appointment is July 12th at 9:40 for medication management with Cyndi Lennert and  July 16th with your therapist Genia Del  Continue with compliance of taking medication prescribed by Doctor and keep all medical appointments Keep medical appointment we reviewed today for physical therapy, You have decided to put this on hold to talk with [name] at the [facility]  [phone] for Medicare options.  At this time [name] is the only one that has all of your providers in network this includes $320 for flex dollars for Benefit card; , dental ,  0 doctor visit, 0 for therapy ; Silver Sneakers , foot care &   purse devise with life station ( "fallen and can't get up").         Our next appointment is by telephone on 08/28/22 at 10:30   Please call the care guide team at [phone] if you need to cancel or reschedule your appointment.   If you or anyone you know are experiencing a Mental Health or Behavioral Health Crisis or need someone to talk to, please call the Suicide and Crisis Lifeline: 988 call the [place] National Suicide Prevention Lifeline: 684 125 9379 or TTY: (218)738-0997 TTY (706)354-5235) to talk to a trained counselor call 1-800-273-TALK (toll free, 24 hour hotline) go to [facility] [address], [place] [phone])    Patient verbalizes understanding of instructions and care plan provided today and agrees to view in MyChart. Active MyChart status and  patient understanding of how to access instructions and care plan via MyChart confirmed with patient.      [name], LCSW Social Work Care Coordination  [facility] [employer] [phone]

## 2022-08-21 ENCOUNTER — Encounter (HOSPITAL_BASED_OUTPATIENT_CLINIC_OR_DEPARTMENT_OTHER): Payer: Self-pay

## 2022-08-21 ENCOUNTER — Ambulatory Visit (HOSPITAL_BASED_OUTPATIENT_CLINIC_OR_DEPARTMENT_OTHER): Payer: Medicare Other

## 2022-08-21 DIAGNOSIS — M5459 Other low back pain: Secondary | ICD-10-CM

## 2022-08-21 DIAGNOSIS — G8929 Other chronic pain: Secondary | ICD-10-CM | POA: Diagnosis not present

## 2022-08-21 DIAGNOSIS — M6281 Muscle weakness (generalized): Secondary | ICD-10-CM

## 2022-08-21 DIAGNOSIS — M545 Low back pain, unspecified: Secondary | ICD-10-CM | POA: Diagnosis not present

## 2022-08-21 DIAGNOSIS — M25561 Pain in right knee: Secondary | ICD-10-CM | POA: Diagnosis not present

## 2022-08-21 NOTE — Therapy (Signed)
OUTPATIENT PHYSICAL THERAPY THORACOLUMBAR TREATMENT   Patient Name: [name] MRN: [id number] DOB:19-Dec-1965, 57 y.o., female Today's Date: 08/21/2022  END OF SESSION:  PT End of Session - 08/21/22 1103     Visit Number 20    Number of Visits 29    Date for PT Re-Evaluation 09/13/22    Progress Note Due on Visit 23    PT Start Time 1103    PT Stop Time 1144    PT Time Calculation (min) 41 min    Activity Tolerance Patient tolerated treatment well    Behavior During Therapy WFL for tasks assessed/performed                Past Medical History:  Diagnosis Date   ADD (attention deficit disorder)    Anxiety    Arthritis    both knees right worse than left   Carpal tunnel syndrome of right wrist    Depression    Essential hypertension, benign    Gallstones    History of smoking 06/22/2016   Hyperlipidemia associated with type 2 diabetes mellitus (HCC), on Zocor 04/04/2011   Hypertension associated with diabetes (HCC) 06/03/2008   Migraines    Morbid obesity (HCC)    Morbid obesity with BMI of 50.0-59.9, adult (HCC) 07/12/2006   NICM (nonischemic cardiomyopathy) (HCC) 07/12/2006   01/16/18 ECHO:    - Procedure narrative: Transthoracic echocardiography. Image   quality was suboptimal. The study was technically difficult.   Intravenous contrast (Definity) was administered. - Left ventricle: The cavity size was moderately dilated. Wall   thickness was increased in a pattern of mild LVH. Systolic   function was moderately to severely reduced. The estimated   ejection fraction w   Normal coronary arteries 04/17/2016   OSA on CPAP 10/28/2014   Spinal stenosis    back pain   Spondylosis without myelopathy or radiculopathy, lumbar region 12/04/2017   Type 2 diabetes mellitus with hyperglycemia [facility])    Past Surgical History:  Procedure Laterality Date   CHOLECYSTECTOMY     DILATATION & CURETTAGE/HYSTEROSCOPY WITH MYOSURE N/A 10/31/2017   Procedure: DILATATION & CURETTAGE/HYSTEROSCOPY  WITH MYOSURE;  Surgeon: Romualdo Bolk, MD;  Location: [facility];  Service: Gynecology;  Laterality: N/A;   HYSTEROSCOPY WITH D & C N/A 05/07/2022   Procedure: DILATATION AND CURETTAGE /HYSTEROSCOPY;  Surgeon: [name], MD;  Location: [facility] OR;  Service: Gynecology;  Laterality: N/A;   RIGHT/LEFT HEART CATH AND CORONARY ANGIOGRAPHY N/A 04/11/2016   Procedure: Right/Left Heart Cath and Coronary Angiography;  Surgeon: Kathleene Hazel, MD;  Location: [facility] INVASIVE CV LAB;  Service: Cardiovascular;  Laterality: N/A;   sonogram for blood clots     no blockages   Patient Active Problem List   Diagnosis Date Noted   Rosacea, acne 08/13/2022   BRBPR (bright red blood per rectum) 08/13/2022   Primary osteoarthritis of both knees 03/06/2022   Bilateral ovarian cysts 03/06/2022   Major depressive disorder, recurrent episode, severe with anxious distress (HCC) 08/09/2021   Attention deficit hyperactivity disorder (ADHD), predominantly inattentive type 08/09/2021   Need for varicella vaccine 08/09/2021   Urge incontinence 08/01/2021   Tinea corporis 03/14/2021   Hyperlipidemia LDL goal <100 03/01/2021   DDD (degenerative disc disease), lumbar 08/08/2020   Non-seasonal allergic rhinitis due to pollen 07/25/2020   Chronic idiopathic constipation 07/25/2020   Visit for screening mammogram 02/11/2020   Chronic bilateral low back pain without sciatica 02/10/2020   Spondylosis without myelopathy or radiculopathy, lumbar region 12/04/2017  Type 2 diabetes mellitus with hyperglycemia (HCC)    Normal coronary arteries 04/17/2016   OSA on CPAP 10/28/2014   Chronic systolic CHF (congestive heart failure), NYHA class 3 (HCC) 12/06/2013   Hyperlipidemia associated with type 2 diabetes mellitus (HCC), on Zocor 04/04/2011   Hypertension associated with diabetes (HCC) 06/03/2008   Morbid obesity with BMI of 50.0-59.9, adult (HCC) 07/12/2006   NICM (nonischemic cardiomyopathy) (HCC) 07/12/2006    Migraine without status migrainosus, not intractable 07/12/2006    PCP: [name], MD   REFERRING PROVIDER: Rodolph Bong, MD   REFERRING DIAG:   Chronic pain of right knee  M54.50,G89.29 (ICD-10-CM) - Chronic bilateral low back pain without sciatica    Rationale for Evaluation and Treatment: Rehabilitation  THERAPY DIAG:  Other low back pain  Muscle weakness (generalized)  Chronic pain of right knee  Chronic bilateral low back pain without sciatica  ONSET DATE: >5  SUBJECTIVE:                                                                                                                                                                                           SUBJECTIVE STATEMENT: Pt reports she had a flat tire after last PT session and has not been able to go anywhere. Son drove her today and cannot fit scooter in his car, so walked from the front doors to the clinic without assistance. Pt has 10/10 pain level over Right side of low back.    PERTINENT HISTORY:  [name] 03/19/22: 57 y.o. female with chronic low back pain and chronic right knee pain  . . . bothersome paresthesias sensation bilateral lower extremities.  . . . Right knee pain due to exacerbation of DJD.  . . . Chronic back pain thought to be due to core weakness and facet arthritis changes seen on CT scan.   PAIN:  Are you having pain? Yes: NPRS scale: 10/10 Pain location: LB to bilat hips, Rt shoulder, neck  Pain description: ache, pressure Aggravating factors: standing and moving, walking through home Relieving factors: sitting  PRECAUTIONS: Knee and Back  WEIGHT BEARING RESTRICTIONS: No  FALLS:  Has patient fallen in last 6 months? No  LIVING ENVIRONMENT: Lives with: lives with their family and lives with their spouse Lives in: House/apartment Stairs: Yes: Internal: 16 steps; on right going up Has following equipment at home: Walker - 4 wheeled and shower chair  OCCUPATION:  disabled  PLOF: Needs assistance with ADLs, Needs assistance with homemaking, and Needs assistance with gait  PATIENT GOALS: increased movement without pain, ADL's, cook, climb stairs, go to the store without hurting  NEXT MD VISIT: 6 weeks  OBJECTIVE:   DIAGNOSTIC FINDINGS:  X-ray images right knee: Mild medial compartment DJD.  Mild patellofemoral DJD  CT scan images of lumbar spine: Diffuse degenerative changes present throughout lumbar spine   PATIENT SURVEYS:  FOTO Primary score 22% with goal 39% 06/28/22: 22% 08/07/22: Lumbar: 16%, knee: 29%  SCREENING FOR RED FLAGS: none  COGNITION: Overall cognitive status: Within functional limits for tasks assessed     SENSATION: wfl  POSTURE: increased thoracic kyphosis,right shoulder elevated  PALPATION: No tenderness although difficulty to assess due to body habitus  LUMBAR ROM:   AROM eval 06/28/22 07/19/22  Flexion Mid shin    Extension 0 10d wfl  Right lateral flexion 50% 75% full  Left lateral flexion 50% 75% full  Right rotation 100%    Left rotation 100%     (Blank rows = not tested)  LOWER EXTREMITY ROM:      Limited due to body habitus  LOWER EXTREMITY MMT:    MMT Right eval Left eval 06/28/22 Right / left 07/19/22 Right / Left 08/21/22 Right/left  Hip flexion 3+ 3+ 4-/5 35.6 / 41.7 42.3/42.2  Hip extension       Hip abduction    35.9 / 33/9 35.7/37.0  Hip adduction       Hip internal rotation       Hip external rotation       Knee flexion 4- 4- 4+ / 4+  24.7/34.0  Knee extension 4+ 4+ 5- / 5- 25.6 / 30.1 31.0/36.3  Ankle dorsiflexion       Ankle plantarflexion       Ankle inversion       Ankle eversion        (Blank rows = not tested)  LUMBAR SPECIAL TESTS:  Slump test: Negative  FUNCTIONAL TESTS:  5 times sit to stand: 24.16 seated on scooter Timed up and go (TUG): 20.90 from seated on scooter Berg: 35/56  06/21/22:  5x STS:  18.40 seated on scooter    TUG 12.91 from seated on  scooter  06/28/22: Sharlene Motts: 40/56    07/19/22 TUG: 14.85      5 x STS: 17.11  GAIT: Distance walked: 30 ft Assistive device utilized: None Level of assistance:  none Comments: shoulder retraction, antalgic gait pattern  TODAY'S TREATMENT:        6/26: Gait with transport wc- 333ft (full lap around clinic. Frequent standing breaks but no seated breaks.)  Updated muscle testing Standing L stretch at rail Standing marching x20  LAQ 2# x15ea Standing gastroc stretch- 2x30s R LE Standing hip abduction 2x10ea  6/21: * without support:  walking forward/ backward with reciprocal arm swing *side step with shoulder add/abd without and with yellow hand floats * forward marching and forward walking kicks * UE on yellow hand floats:  leg swings into hip flex/ext and hip abdct/ addct; heel raises; squats (with vertical trunk)  * Lt forward step ups x 10 with BUE on rails * UE on blue hand floats:  knee tucks ->  side to side pendulums x 4 each;  front to back pendulums x 4 with cues and demo for improved technique * return to walking forward/ backward    PATIENT EDUCATION:  Education details: Aquatic therapy progressions/ modifications Person educated: Patient Education method: Explanation Education comprehension: verbalized understanding  HOME EXERCISE PROGRAM: Pt will climb stairs 1 -2 x weekly 06/28/22  ASSESSMENT:  CLINICAL IMPRESSION: Deferred Berg testing due to increase in pain  today. Will reassess next land visit. Despite increased pain levels, she was able to ambulate 332ft in clinic holding onto transport w/c. Continued with standing and seated strengthening with good tolerance. Added gastroc stretch for R LE due to complaints of tightness and tenderness in posterior knee. Updated strength testing today which revealed significant improvement in all areas. Pt reports she has bene able to do more around the house and relying less on others to assist her. She does note that her back  continues to bother her with her increased activity level. She has climbed her stairs at home 1 time following last session.     OBJECTIVE IMPAIRMENTS: decreased activity tolerance, decreased balance, decreased endurance, decreased knowledge of use of DME, decreased mobility, difficulty walking, decreased strength, impaired sensation, postural dysfunction, obesity, and pain.   ACTIVITY LIMITATIONS: carrying, lifting, bending, sitting, standing, squatting, stairs, transfers, bed mobility, locomotion level, and caring for others  PARTICIPATION LIMITATIONS: meal prep, cleaning, laundry, shopping, community activity, and occupation  PERSONAL FACTORS: Age, Behavior pattern, Fitness, Time since onset of injury/illness/exacerbation, and 1-2 comorbidities: cardiomyopathy, Oa knees and back, depression/anxiety  are also affecting patient's functional outcome.   REHAB POTENTIAL: Good  CLINICAL DECISION MAKING: Evolving/moderate complexity  EVALUATION COMPLEXITY: Moderate   GOALS: Goals reviewed with patient? Yes  SHORT TERM GOALS: Target date: 06/24/22  Pt will tolerate full aquatic sessions consistently without increase in pain and with improving function to demonstrate good toleration and effectiveness of intervention.   Baseline:TBA Goal status:MET - 06/14/22  2.  Pt will climb stairs using combination of step to and alternating pattern without pausing consistently Baseline: improving - some pauses, but not consistent - 06/19/22 Goal status:ONGOING   3.  Pt will report climbing stairs at home 1-2 x weekly to access bedroom and full bath more regularly. Baseline: <1x weekly Pt reports she has been showering at therapy and has not been climbing stair Goal status: ongoing 07/19/22  4.  Pt will tolerate walking 8-10 continuous minutes in pool to demonstrate improving toleration to activity Baseline: na Goal status: MET - 06/14/22  5.  Pt will improve on 5 X STS test to <or=   18s to  demonstrate improving functional lower extremity strength, transitional movements, and balance Baseline: 24.16 at eval;  18.4s on 06/21/22 Goal status: MET 07/19/22 (17.11)  6.  Pt will improve on Tug test to <or= 14s to demonstrate improvement in lower extremity function, mobility and decreased fall risk.  Baseline:  20.90 at eval;  12.91 on 06/21/22 Goal status: MET   LONG TERM GOALS: Target date: 09/13/22  Pt to meet stated Foto Goal of 39% to demonstrate improved perception of ability Baseline: 22% Goal status: Ongoing 06/28/22  2.  Pt will be indep with final HEP's (land and aquatic as appropriate) for continued management of condition  Baseline:  Goal status: in process 07/19/22  3.  Pt will walk in store for up to or >15 mins pushing cart Baseline: rides scooter Goal status: Ongoing -06/28/22  4.  Pt will report decrease in pain with amb to <5/10 to demonstrate improvement management of condition Baseline: 9/10 Goal status: ongoing  06/28/22  5.  Pt will improve on Berg balance test to >/= 50/56 to demonstrate a decrease in fall risk.  Baseline: 35/56 Goal status: in progress 06/28/22 (40/56)  6.  Pt will increase strength of hip by 1 full grade to improve functional mobility. Baseline: see chart Goal status: deferred (changes measurement tool) 07/19/22  7.Pt will  improve strength in all areas listed by 10lb to demonstrate improved overall physical function  Baseline: see chart  Goal Status: NEW   PLAN:  PT FREQUENCY: 1-2x/week  PT DURATION: 8 weeks alternating settings land and aquatics  PLANNED INTERVENTIONS: Therapeutic exercises, Therapeutic activity, Neuromuscular re-education, Balance training, Gait training, Patient/Family education, Self Care, Joint mobilization, Stair training, DME instructions, Aquatic Therapy, Dry Needling, Cryotherapy, Moist heat, Taping, Ultrasound, Biofeedback, Ionotophoresis 4mg /ml Dexamethasone, Manual therapy, and Re-evaluation.  PLAN FOR NEXT  SESSION: Progress strength, ROM, balance, gait and pain reduction land and aquatics.  Encourage use of community pool in between therapy sessions, with use of rollator to/from car to facility.   Riki Altes, PTA  08/21/22 12:01 PM [facility] [address] [place], [place], [address] Phone: [phone]   Fax:  [phone]

## 2022-08-23 ENCOUNTER — Encounter (HOSPITAL_BASED_OUTPATIENT_CLINIC_OR_DEPARTMENT_OTHER): Payer: Self-pay

## 2022-08-23 ENCOUNTER — Ambulatory Visit (HOSPITAL_BASED_OUTPATIENT_CLINIC_OR_DEPARTMENT_OTHER): Payer: Medicare Other

## 2022-08-23 DIAGNOSIS — M545 Low back pain, unspecified: Secondary | ICD-10-CM | POA: Diagnosis not present

## 2022-08-23 DIAGNOSIS — M5459 Other low back pain: Secondary | ICD-10-CM

## 2022-08-23 DIAGNOSIS — M6281 Muscle weakness (generalized): Secondary | ICD-10-CM | POA: Diagnosis not present

## 2022-08-23 DIAGNOSIS — G8929 Other chronic pain: Secondary | ICD-10-CM

## 2022-08-23 DIAGNOSIS — M25561 Pain in right knee: Secondary | ICD-10-CM | POA: Diagnosis not present

## 2022-08-23 NOTE — Therapy (Signed)
OUTPATIENT PHYSICAL THERAPY THORACOLUMBAR TREATMENT   Patient Name: [name] MRN: [id number] DOB:[birth year], 57 y.o., female Today's Date: 08/23/2022  END OF SESSION:  PT End of Session - 08/23/22 1101     Visit Number 21    Number of Visits 29    Date for PT Re-Evaluation 09/13/22    Authorization Type MCR    Progress Note Due on Visit 23    PT Start Time 1100    PT Stop Time 1130    PT Time Calculation (min) 30 min    Activity Tolerance Patient tolerated treatment well    Behavior During Therapy WFL for tasks assessed/performed                 Past Medical History:  Diagnosis Date   ADD (attention deficit disorder)    Anxiety    Arthritis    both knees right worse than left   Carpal tunnel syndrome of right wrist    Depression    Essential hypertension, benign    Gallstones    History of smoking 06/22/2016   Hyperlipidemia associated with type 2 diabetes mellitus (HCC), on Zocor 04/04/2011   Hypertension associated with diabetes (HCC) 06/03/2008   Migraines    Morbid obesity (HCC)    Morbid obesity with BMI of 50.0-59.9, adult (HCC) 07/12/2006   NICM (nonischemic cardiomyopathy) (HCC) 07/12/2006   01/16/18 ECHO:    - Procedure narrative: Transthoracic echocardiography. Image   quality was suboptimal. The study was technically difficult.   Intravenous contrast (Definity) was administered. - Left ventricle: The cavity size was moderately dilated. Wall   thickness was increased in a pattern of mild LVH. Systolic   function was moderately to severely reduced. The estimated   ejection fraction w   Normal coronary arteries 04/17/2016   OSA on CPAP 10/28/2014   Spinal stenosis    back pain   Spondylosis without myelopathy or radiculopathy, lumbar region 12/04/2017   Type 2 diabetes mellitus with hyperglycemia [facility])    Past Surgical History:  Procedure Laterality Date   CHOLECYSTECTOMY     DILATATION & CURETTAGE/HYSTEROSCOPY WITH MYOSURE N/A 10/31/2017   Procedure:  DILATATION & CURETTAGE/HYSTEROSCOPY WITH MYOSURE;  Surgeon: Romualdo Bolk, MD;  Location: [facility];  Service: Gynecology;  Laterality: N/A;   HYSTEROSCOPY WITH D & C N/A 05/07/2022   Procedure: DILATATION AND CURETTAGE /HYSTEROSCOPY;  Surgeon: [name], MD;  Location: [facility] OR;  Service: Gynecology;  Laterality: N/A;   RIGHT/LEFT HEART CATH AND CORONARY ANGIOGRAPHY N/A 04/11/2016   Procedure: Right/Left Heart Cath and Coronary Angiography;  Surgeon: Kathleene Hazel, MD;  Location: [facility] INVASIVE CV LAB;  Service: Cardiovascular;  Laterality: N/A;   sonogram for blood clots     no blockages   Patient Active Problem List   Diagnosis Date Noted   Rosacea, acne 08/13/2022   BRBPR (bright red blood per rectum) 08/13/2022   Primary osteoarthritis of both knees 03/06/2022   Bilateral ovarian cysts 03/06/2022   Major depressive disorder, recurrent episode, severe with anxious distress (HCC) 08/09/2021   Attention deficit hyperactivity disorder (ADHD), predominantly inattentive type 08/09/2021   Need for varicella vaccine 08/09/2021   Urge incontinence 08/01/2021   Tinea corporis 03/14/2021   Hyperlipidemia LDL goal <100 03/01/2021   DDD (degenerative disc disease), lumbar 08/08/2020   Non-seasonal allergic rhinitis due to pollen 07/25/2020   Chronic idiopathic constipation 07/25/2020   Visit for screening mammogram 02/11/2020   Chronic bilateral low back pain without sciatica 02/10/2020   Spondylosis  without myelopathy or radiculopathy, lumbar region 12/04/2017   Type 2 diabetes mellitus with hyperglycemia (HCC)    Normal coronary arteries 04/17/2016   OSA on CPAP 10/28/2014   Chronic systolic CHF (congestive heart failure), NYHA class 3 (HCC) 12/06/2013   Hyperlipidemia associated with type 2 diabetes mellitus (HCC), on Zocor 04/04/2011   Hypertension associated with diabetes (HCC) 06/03/2008   Morbid obesity with BMI of 50.0-59.9, adult (HCC) 07/12/2006   NICM (nonischemic  cardiomyopathy) (HCC) 07/12/2006   Migraine without status migrainosus, not intractable 07/12/2006    PCP: [name], MD   REFERRING PROVIDER: Rodolph Bong, MD   REFERRING DIAG:   Chronic pain of right knee  M54.50,G89.29 (ICD-10-CM) - Chronic bilateral low back pain without sciatica    Rationale for Evaluation and Treatment: Rehabilitation  THERAPY DIAG:  Other low back pain  Muscle weakness (generalized)  Chronic pain of right knee  Chronic bilateral low back pain without sciatica  ONSET DATE: >5  SUBJECTIVE:                                                                                                                                                                                           SUBJECTIVE STATEMENT: Pt again ambulates into clinic without AD. She was dropped of at front door by her son. "I wanted to cancel today. My knee and hand really hurts." R hand hurts at rest and worse with activity. Prevents her from sleeping. R knee 10/10 pain and hurts in shin too.    PERTINENT HISTORY:  Samantha Saucier MD 03/19/22: 57 y.o. female with chronic low back pain and chronic right knee pain  . . . bothersome paresthesias sensation bilateral lower extremities.  . . . Right knee pain due to exacerbation of DJD.  . . . Chronic back pain thought to be due to core weakness and facet arthritis changes seen on CT scan.   PAIN:  Are you having pain? Yes: NPRS scale: 10/10 Pain location: LB to bilat hips, Rt shoulder, neck  Pain description: ache, pressure Aggravating factors: standing and moving, walking through home Relieving factors: sitting  PRECAUTIONS: Knee and Back  WEIGHT BEARING RESTRICTIONS: No  FALLS:  Has patient fallen in last 6 months? No  LIVING ENVIRONMENT: Lives with: lives with their family and lives with their spouse Lives in: House/apartment Stairs: Yes: Internal: 16 steps; on right going up Has following equipment at home: Dan Humphreys - 4 wheeled and  shower chair  OCCUPATION: disabled  PLOF: Needs assistance with ADLs, Needs assistance with homemaking, and Needs assistance with gait  PATIENT GOALS: increased movement without pain, ADL's, cook, climb  stairs, go to the store without hurting  NEXT MD VISIT: 6 weeks  OBJECTIVE:   DIAGNOSTIC FINDINGS:  X-ray images right knee: Mild medial compartment DJD.  Mild patellofemoral DJD  CT scan images of lumbar spine: Diffuse degenerative changes present throughout lumbar spine   PATIENT SURVEYS:  FOTO Primary score 22% with goal 39% 06/28/22: 22% 08/07/22: Lumbar: 16%, knee: 29%  SCREENING FOR RED FLAGS: none  COGNITION: Overall cognitive status: Within functional limits for tasks assessed     SENSATION: wfl  POSTURE: increased thoracic kyphosis,right shoulder elevated  PALPATION: No tenderness although difficulty to assess due to body habitus  LUMBAR ROM:   AROM eval 06/28/22 07/19/22  Flexion Mid shin    Extension 0 10d wfl  Right lateral flexion 50% 75% full  Left lateral flexion 50% 75% full  Right rotation 100%    Left rotation 100%     (Blank rows = not tested)  LOWER EXTREMITY ROM:      Limited due to body habitus  LOWER EXTREMITY MMT:    MMT Right eval Left eval 06/28/22 Right / left 07/19/22 Right / Left 08/21/22 Right/left  Hip flexion 3+ 3+ 4-/5 35.6 / 41.7 42.3/42.2  Hip extension       Hip abduction    35.9 / 33/9 35.7/37.0  Hip adduction       Hip internal rotation       Hip external rotation       Knee flexion 4- 4- 4+ / 4+  24.7/34.0  Knee extension 4+ 4+ 5- / 5- 25.6 / 30.1 31.0/36.3  Ankle dorsiflexion       Ankle plantarflexion       Ankle inversion       Ankle eversion        (Blank rows = not tested)  LUMBAR SPECIAL TESTS:  Slump test: Negative  FUNCTIONAL TESTS:  5 times sit to stand: 24.16 seated on scooter Timed up and go (TUG): 20.90 from seated on scooter Berg: 35/56  06/21/22:  5x STS:  18.40 seated on scooter    TUG 12.91  from seated on scooter  06/28/22: Sharlene Motts: 40/56    07/19/22 TUG: 14.85      5 x STS: 17.11  GAIT: Distance walked: 30 ft Assistive device utilized: None Level of assistance:  none Comments: shoulder retraction, antalgic gait pattern  TODAY'S TREATMENT:        6/28: Gait with/without transport wc- 354ft (full lap around clinic. 2 standing rest breaks towards end. Only used w/c initial 51ft, then stopped due to hand pain)  Seated HSS 30sec x3 R  Standing marching x20   Standing hip abduction 2x10ea    6/26: Gait with transport wc- 360ft (full lap around clinic. Frequent standing breaks but no seated breaks.)  Updated muscle testing Standing L stretch at rail Standing marching x20  LAQ 2# x15ea Standing gastroc stretch- 2x30s R LE Standing hip abduction 2x10ea  6/21: * without support:  walking forward/ backward with reciprocal arm swing *side step with shoulder add/abd without and with yellow hand floats * forward marching and forward walking kicks * UE on yellow hand floats:  leg swings into hip flex/ext and hip abdct/ addct; heel raises; squats (with vertical trunk)  * Lt forward step ups x 10 with BUE on rails * UE on blue hand floats:  knee tucks ->  side to side pendulums x 4 each;  front to back pendulums x 4 with cues and demo for improved technique *  return to walking forward/ backward    PATIENT EDUCATION:  Education details: Aquatic therapy progressions/ modifications Person educated: Patient Education method: Explanation Education comprehension: verbalized understanding  HOME EXERCISE PROGRAM: Pt will climb stairs 1 -2 x weekly 06/28/22  ASSESSMENT:  CLINICAL IMPRESSION: Will complete BERG balance testing next session. Pt had increased R knee pain today. Pt showed good effort in PT today and wanted to complete full walking distance. Did well without utilizing w/c for assistance due to her hand pain. Able to complete standing exercises with improved self  correction for posture. Pt shows improving endurance with functional tasks, though remains limited by subjective c/o pain. Decreased session time today due to increase in pain level. Advised pt bring up concerns of neck and hand pain to PCP soon.    OBJECTIVE IMPAIRMENTS: decreased activity tolerance, decreased balance, decreased endurance, decreased knowledge of use of DME, decreased mobility, difficulty walking, decreased strength, impaired sensation, postural dysfunction, obesity, and pain.   ACTIVITY LIMITATIONS: carrying, lifting, bending, sitting, standing, squatting, stairs, transfers, bed mobility, locomotion level, and caring for others  PARTICIPATION LIMITATIONS: meal prep, cleaning, laundry, shopping, community activity, and occupation  PERSONAL FACTORS: Age, Behavior pattern, Fitness, Time since onset of injury/illness/exacerbation, and 1-2 comorbidities: cardiomyopathy, Oa knees and back, depression/anxiety  are also affecting patient's functional outcome.   REHAB POTENTIAL: Good  CLINICAL DECISION MAKING: Evolving/moderate complexity  EVALUATION COMPLEXITY: Moderate   GOALS: Goals reviewed with patient? Yes  SHORT TERM GOALS: Target date: 06/24/22  Pt will tolerate full aquatic sessions consistently without increase in pain and with improving function to demonstrate good toleration and effectiveness of intervention.   Baseline:TBA Goal status:MET - 06/14/22  2.  Pt will climb stairs using combination of step to and alternating pattern without pausing consistently Baseline: improving - some pauses, but not consistent - 06/19/22 Goal status:ONGOING   3.  Pt will report climbing stairs at home 1-2 x weekly to access bedroom and full bath more regularly. Baseline: <1x weekly Pt reports she has been showering at therapy and has not been climbing stair Goal status: ongoing 07/19/22  4.  Pt will tolerate walking 8-10 continuous minutes in pool to demonstrate improving toleration  to activity Baseline: na Goal status: MET - 06/14/22  5.  Pt will improve on 5 X STS test to <or=   18s to demonstrate improving functional lower extremity strength, transitional movements, and balance Baseline: 24.16 at eval;  18.4s on 06/21/22 Goal status: MET 07/19/22 (17.11)  6.  Pt will improve on Tug test to <or= 14s to demonstrate improvement in lower extremity function, mobility and decreased fall risk.  Baseline:  20.90 at eval;  12.91 on 06/21/22 Goal status: MET   LONG TERM GOALS: Target date: 09/13/22  Pt to meet stated Foto Goal of 39% to demonstrate improved perception of ability Baseline: 22% Goal status: Ongoing 06/28/22  2.  Pt will be indep with final HEP's (land and aquatic as appropriate) for continued management of condition  Baseline:  Goal status: in process 07/19/22  3.  Pt will walk in store for up to or >15 mins pushing cart Baseline: rides scooter Goal status: Ongoing -06/28/22  4.  Pt will report decrease in pain with amb to <5/10 to demonstrate improvement management of condition Baseline: 9/10 Goal status: ongoing  06/28/22  5.  Pt will improve on Berg balance test to >/= 50/56 to demonstrate a decrease in fall risk.  Baseline: 35/56 Goal status: in progress 06/28/22 (40/56)  6.  Pt will increase strength of hip by 1 full grade to improve functional mobility. Baseline: see chart Goal status: deferred (changes measurement tool) 07/19/22  7.Pt will improve strength in all areas listed by 10lb to demonstrate improved overall physical function  Baseline: see chart  Goal Status: NEW   PLAN:  PT FREQUENCY: 1-2x/week  PT DURATION: 8 weeks alternating settings land and aquatics  PLANNED INTERVENTIONS: Therapeutic exercises, Therapeutic activity, Neuromuscular re-education, Balance training, Gait training, Patient/Family education, Self Care, Joint mobilization, Stair training, DME instructions, Aquatic Therapy, Dry Needling, Cryotherapy, Moist heat, Taping,  Ultrasound, Biofeedback, Ionotophoresis 4mg /ml Dexamethasone, Manual therapy, and Re-evaluation.  PLAN FOR NEXT SESSION: Progress strength, ROM, balance, gait and pain reduction land and aquatics.  Encourage use of community pool in between therapy sessions, with use of rollator to/from car to facility.   Riki Altes, PTA  08/23/22 2:27 PM [facility] [address] [place], [place], [address] Phone: [phone]   Fax:  [phone]

## 2022-08-28 ENCOUNTER — Encounter (HOSPITAL_BASED_OUTPATIENT_CLINIC_OR_DEPARTMENT_OTHER): Payer: Self-pay

## 2022-08-28 ENCOUNTER — Ambulatory Visit: Payer: Self-pay | Admitting: Licensed Clinical Social Worker

## 2022-08-28 ENCOUNTER — Ambulatory Visit (HOSPITAL_BASED_OUTPATIENT_CLINIC_OR_DEPARTMENT_OTHER): Payer: Medicare Other | Attending: Family Medicine

## 2022-08-28 DIAGNOSIS — M5459 Other low back pain: Secondary | ICD-10-CM | POA: Insufficient documentation

## 2022-08-28 DIAGNOSIS — G8929 Other chronic pain: Secondary | ICD-10-CM | POA: Insufficient documentation

## 2022-08-28 DIAGNOSIS — M545 Low back pain, unspecified: Secondary | ICD-10-CM | POA: Diagnosis not present

## 2022-08-28 DIAGNOSIS — M25561 Pain in right knee: Secondary | ICD-10-CM | POA: Diagnosis not present

## 2022-08-28 DIAGNOSIS — M6281 Muscle weakness (generalized): Secondary | ICD-10-CM | POA: Diagnosis not present

## 2022-08-28 NOTE — Patient Outreach (Signed)
  Care Coordination  In person at Rehab  Visit Note   08/28/2022 Name: [name] MRN: [id number] DOB: [birth year]  [name] is a 57 y.o. year old female who sees [name], MD for primary care. I spoke with  [name] by phone today.  What matters to the patients health and wellness today?  Getting to appointments and improving her mental and physical health.    Goals Addressed             This Visit's Progress    Care Coordination Activities for mental & physical health support       Activities in order to accomplish goals.   Congratulations on making progress with physical therapy  I am glad we were able to get your appointment scheduled with [facility] [phone]   your next appointment is July 12th at 9:40 for medication management with [name] and  July 16th with your therapist [name]  Continue with compliance of taking medication prescribed by Doctor and keep all medical appointments Keep medical appointment we reviewed today for physical therapy, You have decided to put this on hold to talk with [name] at the [facility]  [phone] for Medicare options.  At this time [name] is the only one that has all of your providers in network this includes $320 for flex dollars for Benefit card; , dental ,  0 doctor visit, 0 for therapy ; Silver Sneakers , foot care &   purse devise with life station ( "fallen and can't get up").        SDOH assessments and interventions completed:  No   Care Coordination Interventions:  Yes, provided  Interventions Today    Flowsheet Row Most Recent Value  General Interventions   General Interventions Discussed/Reviewed General Interventions Reviewed  Education Interventions   Provided Verbal Education On Mental Health/Coping with Illness  Mental Health Interventions   Mental Health Discussed/Reviewed Coping Strategies  [active listening, emotional support]       Follow up plan:  Will follow up in 1  week    Encounter Outcome:  Pt. Visit Completed   [name], LCSW Social Work Care Coordination  [facility] [employer] [phone]

## 2022-08-28 NOTE — Patient Instructions (Signed)
Social Work Visit Information  Thank you for taking time to visit with me today. Please don't hesitate to contact me if I can be of assistance to you.   Following are the goals we discussed today:   Goals Addressed             This Visit's Progress    Care Coordination Activities for mental & physical health support       Activities in order to accomplish goals.   Congratulations on making progress with physical therapy  I am glad we were able to get your appointment scheduled with [facility] [phone]   your next appointment is July 12th at 9:40 for medication management with Cyndi Lennert and  July 16th with your therapist Genia Del  Continue with compliance of taking medication prescribed by Doctor and keep all medical appointments Keep medical appointment we reviewed today for physical therapy, You have decided to put this on hold to talk with [name] at the [facility]  [phone] for Medicare options.  At this time [name] is the only one that has all of your providers in network this includes $320 for flex dollars for Benefit card; , dental ,  0 doctor visit, 0 for therapy ; Silver Sneakers , foot care &   purse devise with life station ( "fallen and can't get up").          No follow up scheduled with social work at this time. Will follow up in 7 to 10 days .Patient will call office if needed prior to next encounter.  Please call the care guide team at [phone] if you need to cancel or reschedule your appointment.   If you or anyone you know are experiencing a Mental Health or Behavioral Health Crisis or need someone to talk to, please call the Suicide and Crisis Lifeline: 988 call the [place] National Suicide Prevention Lifeline: 671-376-7628 or TTY: (743)518-3621 TTY 930-217-2420) to talk to a trained counselor call 1-800-273-TALK (toll free, 24 hour hotline) go to [facility] [address], [place]  [phone])   Patient verbalizes understanding of instructions and care plan provided today and agrees to view in MyChart. Active MyChart status and patient understanding of how to access instructions and care plan via MyChart confirmed with patient.      [name], LCSW Social Work Care Coordination  [facility] [employer] [phone]

## 2022-08-28 NOTE — Therapy (Signed)
OUTPATIENT PHYSICAL THERAPY THORACOLUMBAR TREATMENT   Patient Name: [name] MRN: [id number] DOB:1965/04/25, 57 y.o., female Today's Date: 08/28/2022  END OF SESSION:  PT End of Session - 08/28/22 1201     Visit Number 22    Number of Visits 29    Date for PT Re-Evaluation 09/13/22    Authorization Type MCR    Progress Note Due on Visit 23    PT Start Time 1103    PT Stop Time 1150    PT Time Calculation (min) 47 min    Activity Tolerance Patient tolerated treatment well    Behavior During Therapy WFL for tasks assessed/performed                  Past Medical History:  Diagnosis Date   ADD (attention deficit disorder)    Anxiety    Arthritis    both knees right worse than left   Carpal tunnel syndrome of right wrist    Depression    Essential hypertension, benign    Gallstones    History of smoking 06/22/2016   Hyperlipidemia associated with type 2 diabetes mellitus (HCC), on Zocor 04/04/2011   Hypertension associated with diabetes (HCC) 06/03/2008   Migraines    Morbid obesity (HCC)    Morbid obesity with BMI of 50.0-59.9, adult (HCC) 07/12/2006   NICM (nonischemic cardiomyopathy) (HCC) 07/12/2006   01/16/18 ECHO:    - Procedure narrative: Transthoracic echocardiography. Image   quality was suboptimal. The study was technically difficult.   Intravenous contrast (Definity) was administered. - Left ventricle: The cavity size was moderately dilated. Wall   thickness was increased in a pattern of mild LVH. Systolic   function was moderately to severely reduced. The estimated   ejection fraction w   Normal coronary arteries 04/17/2016   OSA on CPAP 10/28/2014   Spinal stenosis    back pain   Spondylosis without myelopathy or radiculopathy, lumbar region 12/04/2017   Type 2 diabetes mellitus with hyperglycemia [facility])    Past Surgical History:  Procedure Laterality Date   CHOLECYSTECTOMY     DILATATION & CURETTAGE/HYSTEROSCOPY WITH MYOSURE N/A 10/31/2017   Procedure:  DILATATION & CURETTAGE/HYSTEROSCOPY WITH MYOSURE;  Surgeon: Romualdo Bolk, MD;  Location: [facility];  Service: Gynecology;  Laterality: N/A;   HYSTEROSCOPY WITH D & C N/A 05/07/2022   Procedure: DILATATION AND CURETTAGE /HYSTEROSCOPY;  Surgeon: [name], MD;  Location: [facility] OR;  Service: Gynecology;  Laterality: N/A;   RIGHT/LEFT HEART CATH AND CORONARY ANGIOGRAPHY N/A 04/11/2016   Procedure: Right/Left Heart Cath and Coronary Angiography;  Surgeon: Kathleene Hazel, MD;  Location: [facility] INVASIVE CV LAB;  Service: Cardiovascular;  Laterality: N/A;   sonogram for blood clots     no blockages   Patient Active Problem List   Diagnosis Date Noted   Rosacea, acne 08/13/2022   BRBPR (bright red blood per rectum) 08/13/2022   Primary osteoarthritis of both knees 03/06/2022   Bilateral ovarian cysts 03/06/2022   Major depressive disorder, recurrent episode, severe with anxious distress (HCC) 08/09/2021   Attention deficit hyperactivity disorder (ADHD), predominantly inattentive type 08/09/2021   Need for varicella vaccine 08/09/2021   Urge incontinence 08/01/2021   Tinea corporis 03/14/2021   Hyperlipidemia LDL goal <100 03/01/2021   DDD (degenerative disc disease), lumbar 08/08/2020   Non-seasonal allergic rhinitis due to pollen 07/25/2020   Chronic idiopathic constipation 07/25/2020   Visit for screening mammogram 02/11/2020   Chronic bilateral low back pain without sciatica 02/10/2020  Spondylosis without myelopathy or radiculopathy, lumbar region 12/04/2017   Type 2 diabetes mellitus with hyperglycemia (HCC)    Normal coronary arteries 04/17/2016   OSA on CPAP 10/28/2014   Chronic systolic CHF (congestive heart failure), NYHA class 3 (HCC) 12/06/2013   Hyperlipidemia associated with type 2 diabetes mellitus (HCC), on Zocor 04/04/2011   Hypertension associated with diabetes (HCC) 06/03/2008   Morbid obesity with BMI of 50.0-59.9, adult (HCC) 07/12/2006   NICM (nonischemic  cardiomyopathy) (HCC) 07/12/2006   Migraine without status migrainosus, not intractable 07/12/2006    PCP: [name], MD   REFERRING PROVIDER: Rodolph Bong, MD   REFERRING DIAG:   Chronic pain of right knee  M54.50,G89.29 (ICD-10-CM) - Chronic bilateral low back pain without sciatica    Rationale for Evaluation and Treatment: Rehabilitation  THERAPY DIAG:  Other low back pain  Muscle weakness (generalized)  Chronic pain of right knee  Chronic bilateral low back pain without sciatica  ONSET DATE: >5  SUBJECTIVE:                                                                                                                                                                                           SUBJECTIVE STATEMENT: Pt reports her vehicle is now fixed, but when she got her scooter out of her trunk today at the clinic, it did not turn on. She was able to meet with her social worker [name] in person prior to PT session, which she was happy about. Increased R sided neck pain today and notes bilateral hand pain. "My low back always hurts."   PERTINENT HISTORY:  [name] 03/19/22: 57 y.o. female with chronic low back pain and chronic right knee pain  . . . bothersome paresthesias sensation bilateral lower extremities.  . . . Right knee pain due to exacerbation of DJD.  . . . Chronic back pain thought to be due to core weakness and facet arthritis changes seen on CT scan.   PAIN:  Are you having pain? Yes: NPRS scale: 10/10 Pain location: LB to bilat hips, Rt shoulder, neck  Pain description: ache, pressure Aggravating factors: standing and moving, walking through home Relieving factors: sitting  PRECAUTIONS: Knee and Back  WEIGHT BEARING RESTRICTIONS: No  FALLS:  Has patient fallen in last 6 months? No  LIVING ENVIRONMENT: Lives with: lives with their family and lives with their spouse Lives in: House/apartment Stairs: Yes: Internal: 16 steps; on  right going up Has following equipment at home: Dan Humphreys - 4 wheeled and shower chair  OCCUPATION: disabled  PLOF: Needs assistance with ADLs, Needs assistance with homemaking, and  Needs assistance with gait  PATIENT GOALS: increased movement without pain, ADL's, cook, climb stairs, go to the store without hurting  NEXT MD VISIT: 6 weeks  OBJECTIVE:   DIAGNOSTIC FINDINGS:  X-ray images right knee: Mild medial compartment DJD.  Mild patellofemoral DJD  CT scan images of lumbar spine: Diffuse degenerative changes present throughout lumbar spine   PATIENT SURVEYS:  FOTO Primary score 22% with goal 39% 06/28/22: 22% 08/07/22: Lumbar: 16%, knee: 29%  SCREENING FOR RED FLAGS: none  COGNITION: Overall cognitive status: Within functional limits for tasks assessed     SENSATION: wfl  POSTURE: increased thoracic kyphosis,right shoulder elevated  PALPATION: No tenderness although difficulty to assess due to body habitus  LUMBAR ROM:   AROM eval 06/28/22 07/19/22  Flexion Mid shin    Extension 0 10d wfl  Right lateral flexion 50% 75% full  Left lateral flexion 50% 75% full  Right rotation 100%    Left rotation 100%     (Blank rows = not tested)  LOWER EXTREMITY ROM:      Limited due to body habitus  LOWER EXTREMITY MMT:    MMT Right eval Left eval 06/28/22 Right / left 07/19/22 Right / Left 08/21/22 Right/left  Hip flexion 3+ 3+ 4-/5 35.6 / 41.7 42.3/42.2  Hip extension       Hip abduction    35.9 / 33/9 35.7/37.0  Hip adduction       Hip internal rotation       Hip external rotation       Knee flexion 4- 4- 4+ / 4+  24.7/34.0  Knee extension 4+ 4+ 5- / 5- 25.6 / 30.1 31.0/36.3  Ankle dorsiflexion       Ankle plantarflexion       Ankle inversion       Ankle eversion        (Blank rows = not tested)  LUMBAR SPECIAL TESTS:  Slump test: Negative  FUNCTIONAL TESTS:  5 times sit to stand: 24.16 seated on scooter Timed up and go (TUG): 20.90 from seated on  scooter Berg: 35/56  06/21/22:  5x STS:  18.40 seated on scooter    TUG 12.91 from seated on scooter  06/28/22: Sharlene Motts: 40/56    07/19/22 TUG: 14.85      5 x STS: 17.11  7/3: Berg 47/56  GAIT: Distance walked: 30 ft Assistive device utilized: None Level of assistance:  none Comments: shoulder retraction, antalgic gait pattern  TODAY'S TREATMENT:        7/3:  BERG Balance Test          Date: 7/3  Sit to Stand 4  Standing unsupported 4  Sitting with back unsupported but feet supported 4  Stand to sit  3  Transfers  4  Standing unsupported with eyes closed 4  Standing unsupported feet together 4  From standing position, reach forward with outstretched arm 3 (9 inches)  From standing position, pick up object from floor 3  From standing position, turn and look behind over each shoulder 4  Turn 360 4  Standing unsupported, alternately place foot on step 3 (4inch step)  Standing unsupported, one foot in front 3  Standing on one leg 0  Total:  47/56     Standing marching 2x10 Standing hip abduction 2x10ea  Step ups 4 inch step 2 x 10 bilateral Gait to locker room at end of session, 1 seated rest break   6/28: Gait with/without transport wc- 380ft (full lap around  clinic. 2 standing rest breaks towards end. Only used w/c initial 20ft, then stopped due to hand pain)  Seated HSS 30sec x3 R  Standing marching x20   Standing hip abduction 2x10ea    6/26: Gait with transport wc- 376ft (full lap around clinic. Frequent standing breaks but no seated breaks.)  Updated muscle testing Standing L stretch at rail Standing marching x20  LAQ 2# x15ea Standing gastroc stretch- 2x30s R LE Standing hip abduction 2x10ea  6/21: * without support:  walking forward/ backward with reciprocal arm swing *side step with shoulder add/abd without and with yellow hand floats * forward marching and forward walking kicks * UE on yellow hand floats:  leg swings into hip flex/ext and hip  abdct/ addct; heel raises; squats (with vertical trunk)  * Lt forward step ups x 10 with BUE on rails * UE on blue hand floats:  knee tucks ->  side to side pendulums x 4 each;  front to back pendulums x 4 with cues and demo for improved technique * return to walking forward/ backward    PATIENT EDUCATION:  Education details: Aquatic therapy progressions/ modifications Person educated: Patient Education method: Explanation Education comprehension: verbalized understanding  HOME EXERCISE PROGRAM: Pt will climb stairs 1 -2 x weekly 06/28/22  ASSESSMENT:  CLINICAL IMPRESSION: Patient able to complete step ups of 4 inch step today with single rail use with minimal difficulty.  Will trial staircase in clinic at Next land session.  Patient with increased fatigue at end of session due to completing Berg balance test today.  She had improved score with this to 47/56.  She was most challenged by single-leg balance and tandem balance.  She did require 2-3 seated rest breaks throughout this test.  Patient has progress note tomorrow to measure progress towards goals.  Patient does report improved endurance with her ADLs, though still remains challenged. pt feels challenged by getting out of car and going up steps/curbs outdoors without railings.  Patient continues to avoid stairs at home which we will continue to address in PT.   OBJECTIVE IMPAIRMENTS: decreased activity tolerance, decreased balance, decreased endurance, decreased knowledge of use of DME, decreased mobility, difficulty walking, decreased strength, impaired sensation, postural dysfunction, obesity, and pain.   ACTIVITY LIMITATIONS: carrying, lifting, bending, sitting, standing, squatting, stairs, transfers, bed mobility, locomotion level, and caring for others  PARTICIPATION LIMITATIONS: meal prep, cleaning, laundry, shopping, community activity, and occupation  PERSONAL FACTORS: Age, Behavior pattern, Fitness, Time since onset of  injury/illness/exacerbation, and 1-2 comorbidities: cardiomyopathy, Oa knees and back, depression/anxiety  are also affecting patient's functional outcome.   REHAB POTENTIAL: Good  CLINICAL DECISION MAKING: Evolving/moderate complexity  EVALUATION COMPLEXITY: Moderate   GOALS: Goals reviewed with patient? Yes  SHORT TERM GOALS: Target date: 06/24/22  Pt will tolerate full aquatic sessions consistently without increase in pain and with improving function to demonstrate good toleration and effectiveness of intervention.   Baseline:TBA Goal status:MET - 06/14/22  2.  Pt will climb stairs using combination of step to and alternating pattern without pausing consistently Baseline: improving - some pauses, but not consistent - 06/19/22 Goal status:ONGOING   3.  Pt will report climbing stairs at home 1-2 x weekly to access bedroom and full bath more regularly. Baseline: <1x weekly Pt reports she has been showering at therapy and has not been climbing stair Goal status: ongoing 07/19/22  4.  Pt will tolerate walking 8-10 continuous minutes in pool to demonstrate improving toleration to activity Baseline: na Goal  status: MET - 06/14/22  5.  Pt will improve on 5 X STS test to <or=   18s to demonstrate improving functional lower extremity strength, transitional movements, and balance Baseline: 24.16 at eval;  18.4s on 06/21/22 Goal status: MET 07/19/22 (17.11)  6.  Pt will improve on Tug test to <or= 14s to demonstrate improvement in lower extremity function, mobility and decreased fall risk.  Baseline:  20.90 at eval;  12.91 on 06/21/22 Goal status: MET   LONG TERM GOALS: Target date: 09/13/22  Pt to meet stated Foto Goal of 39% to demonstrate improved perception of ability Baseline: 22% Goal status: Ongoing 06/28/22  2.  Pt will be indep with final HEP's (land and aquatic as appropriate) for continued management of condition  Baseline:  Goal status: in process 07/19/22  3.  Pt will walk in  store for up to or >15 mins pushing cart Baseline: rides scooter Goal status: Ongoing -06/28/22  4.  Pt will report decrease in pain with amb to <5/10 to demonstrate improvement management of condition Baseline: 9/10 Goal status: ongoing  06/28/22  5.  Pt will improve on Berg balance test to >/= 50/56 to demonstrate a decrease in fall risk.  Baseline: 35/56 Goal status: in progress 08/28/22 (47/56)  6.  Pt will increase strength of hip by 1 full grade to improve functional mobility. Baseline: see chart Goal status: deferred (changes measurement tool) 07/19/22  7.Pt will improve strength in all areas listed by 10lb to demonstrate improved overall physical function  Baseline: see chart  Goal Status: NEW   PLAN:  PT FREQUENCY: 1-2x/week  PT DURATION: 8 weeks alternating settings land and aquatics  PLANNED INTERVENTIONS: Therapeutic exercises, Therapeutic activity, Neuromuscular re-education, Balance training, Gait training, Patient/Family education, Self Care, Joint mobilization, Stair training, DME instructions, Aquatic Therapy, Dry Needling, Cryotherapy, Moist heat, Taping, Ultrasound, Biofeedback, Ionotophoresis 4mg /ml Dexamethasone, Manual therapy, and Re-evaluation.  PLAN FOR NEXT SESSION: Progress strength, ROM, balance, gait and pain reduction land and aquatics.  Encourage use of community pool in between therapy sessions, with use of rollator to/from car to facility.   Riki Altes, PTA  08/28/22 12:02 PM [facility] [address] [place], [place], [address] Phone: [phone]   Fax:  [phone]

## 2022-08-30 ENCOUNTER — Encounter (HOSPITAL_BASED_OUTPATIENT_CLINIC_OR_DEPARTMENT_OTHER): Payer: Self-pay | Admitting: Physical Therapy

## 2022-08-30 ENCOUNTER — Ambulatory Visit (HOSPITAL_BASED_OUTPATIENT_CLINIC_OR_DEPARTMENT_OTHER): Payer: Medicare Other | Admitting: Physical Therapy

## 2022-08-30 DIAGNOSIS — M5459 Other low back pain: Secondary | ICD-10-CM | POA: Diagnosis not present

## 2022-08-30 DIAGNOSIS — G8929 Other chronic pain: Secondary | ICD-10-CM | POA: Diagnosis not present

## 2022-08-30 DIAGNOSIS — M6281 Muscle weakness (generalized): Secondary | ICD-10-CM | POA: Diagnosis not present

## 2022-08-30 DIAGNOSIS — M545 Low back pain, unspecified: Secondary | ICD-10-CM | POA: Diagnosis not present

## 2022-08-30 DIAGNOSIS — M25561 Pain in right knee: Secondary | ICD-10-CM | POA: Diagnosis not present

## 2022-08-30 NOTE — Therapy (Signed)
Progress Note Reporting Period 08/16/22 to 08/30/22  See note below for Objective Data and Assessment of Progress/Goals.     OUTPATIENT PHYSICAL THERAPY THORACOLUMBAR TREATMENT   Patient Name: [name] MRN: [id number] DOB:Nov 22, 1965, 57 y.o., female Today's Date: 08/30/2022  END OF SESSION:  PT End of Session - 08/30/22 1330     Visit Number 23    Number of Visits 29    Date for PT Re-Evaluation 09/13/22    Authorization Type MCR    Progress Note Due on Visit 23    PT Start Time 1315    Activity Tolerance Patient tolerated treatment well    Behavior During Therapy [facility] for tasks assessed/performed                  Past Medical History:  Diagnosis Date   ADD (attention deficit disorder)    Anxiety    Arthritis    both knees right worse than left   Carpal tunnel syndrome of right wrist    Depression    Essential hypertension, benign    Gallstones    History of smoking 06/22/2016   Hyperlipidemia associated with type 2 diabetes mellitus (HCC), on Zocor 04/04/2011   Hypertension associated with diabetes (HCC) 06/03/2008   Migraines    Morbid obesity (HCC)    Morbid obesity with BMI of 50.0-59.9, adult (HCC) 07/12/2006   NICM (nonischemic cardiomyopathy) (HCC) 07/12/2006   01/16/18 ECHO:    - Procedure narrative: Transthoracic echocardiography. Image   quality was suboptimal. The study was technically difficult.   Intravenous contrast (Definity) was administered. - Left ventricle: The cavity size was moderately dilated. Wall   thickness was increased in a pattern of mild LVH. Systolic   function was moderately to severely reduced. The estimated   ejection fraction w   Normal coronary arteries 04/17/2016   OSA on CPAP 10/28/2014   Spinal stenosis    back pain   Spondylosis without myelopathy or radiculopathy, lumbar region 12/04/2017   Type 2 diabetes mellitus with hyperglycemia [facility])    Past Surgical History:  Procedure Laterality Date   CHOLECYSTECTOMY     DILATATION &  CURETTAGE/HYSTEROSCOPY WITH MYOSURE N/A 10/31/2017   Procedure: DILATATION & CURETTAGE/HYSTEROSCOPY WITH MYOSURE;  Surgeon: Romualdo Bolk, MD;  Location: [facility];  Service: Gynecology;  Laterality: N/A;   HYSTEROSCOPY WITH D & C N/A 05/07/2022   Procedure: DILATATION AND CURETTAGE /HYSTEROSCOPY;  Surgeon: [name], MD;  Location: [facility] OR;  Service: Gynecology;  Laterality: N/A;   RIGHT/LEFT HEART CATH AND CORONARY ANGIOGRAPHY N/A 04/11/2016   Procedure: Right/Left Heart Cath and Coronary Angiography;  Surgeon: Kathleene Hazel, MD;  Location: [facility] INVASIVE CV LAB;  Service: Cardiovascular;  Laterality: N/A;   sonogram for blood clots     no blockages   Patient Active Problem List   Diagnosis Date Noted   Rosacea, acne 08/13/2022   BRBPR (bright red blood per rectum) 08/13/2022   Primary osteoarthritis of both knees 03/06/2022   Bilateral ovarian cysts 03/06/2022   Major depressive disorder, recurrent episode, severe with anxious distress (HCC) 08/09/2021   Attention deficit hyperactivity disorder (ADHD), predominantly inattentive type 08/09/2021   Need for varicella vaccine 08/09/2021   Urge incontinence 08/01/2021   Tinea corporis 03/14/2021   Hyperlipidemia LDL goal <100 03/01/2021   DDD (degenerative disc disease), lumbar 08/08/2020   Non-seasonal allergic rhinitis due to pollen 07/25/2020   Chronic idiopathic constipation 07/25/2020   Visit for screening mammogram 02/11/2020   Chronic bilateral low back  pain without sciatica 02/10/2020   Spondylosis without myelopathy or radiculopathy, lumbar region 12/04/2017   Type 2 diabetes mellitus with hyperglycemia (HCC)    Normal coronary arteries 04/17/2016   OSA on CPAP 10/28/2014   Chronic systolic CHF (congestive heart failure), NYHA class 3 (HCC) 12/06/2013   Hyperlipidemia associated with type 2 diabetes mellitus (HCC), on Zocor 04/04/2011   Hypertension associated with diabetes (HCC) 06/03/2008   Morbid obesity with  BMI of 50.0-59.9, adult (HCC) 07/12/2006   NICM (nonischemic cardiomyopathy) (HCC) 07/12/2006   Migraine without status migrainosus, not intractable 07/12/2006    PCP: [name], MD   REFERRING PROVIDER: Rodolph Bong, MD   REFERRING DIAG:   Chronic pain of right knee  M54.50,G89.29 (ICD-10-CM) - Chronic bilateral low back pain without sciatica    Rationale for Evaluation and Treatment: Rehabilitation  THERAPY DIAG:  Other low back pain  Muscle weakness (generalized)  Chronic pain of right knee  Chronic bilateral low back pain without sciatica  ONSET DATE: >5  SUBJECTIVE:                                                                                                                                                                                           SUBJECTIVE STATEMENT: I am cooking, my endurance is better doing dishes, some grocery shopping.  Have not been walking up stairs much.  Having trouble getting up curb.  I have been more tired doing more since husband has been gone.    PERTINENT HISTORY:  [name] 03/19/22: 57 y.o. female with chronic low back pain and chronic right knee pain  . . . bothersome paresthesias sensation bilateral lower extremities.  . . . Right knee pain due to exacerbation of DJD.  . . . Chronic back pain thought to be due to core weakness and facet arthritis changes seen on CT scan.   PAIN:  Are you having pain? Yes: NPRS scale: 9/10 Pain location: LB to bilat hips, Rt shoulder, neck  Pain description: ache, pressure Aggravating factors: standing and moving, walking through home Relieving factors: sitting  PRECAUTIONS: Knee and Back  WEIGHT BEARING RESTRICTIONS: No  FALLS:  Has patient fallen in last 6 months? No  LIVING ENVIRONMENT: Lives with: lives with their family and lives with their spouse Lives in: House/apartment Stairs: Yes: Internal: 16 steps; on right going up Has following equipment at home: Walker - 4  wheeled and shower chair  OCCUPATION: disabled  PLOF: Needs assistance with ADLs, Needs assistance with homemaking, and Needs assistance with gait  PATIENT GOALS: increased movement without pain, ADL's, cook, climb stairs, go to the  store without hurting  NEXT MD VISIT: 6 weeks  OBJECTIVE:   DIAGNOSTIC FINDINGS:  X-ray images right knee: Mild medial compartment DJD.  Mild patellofemoral DJD  CT scan images of lumbar spine: Diffuse degenerative changes present throughout lumbar spine   PATIENT SURVEYS:  FOTO Primary score 22% with goal 39% 06/28/22: 22% 08/07/22: Lumbar: 16%, knee: 29%  SCREENING FOR RED FLAGS: none  COGNITION: Overall cognitive status: Within functional limits for tasks assessed     SENSATION: wfl  POSTURE: increased thoracic kyphosis,right shoulder elevated  PALPATION: No tenderness although difficulty to assess due to body habitus  LUMBAR ROM:   AROM eval 06/28/22 07/19/22  Flexion Mid shin    Extension 0 10d wfl  Right lateral flexion 50% 75% full  Left lateral flexion 50% 75% full  Right rotation 100%    Left rotation 100%     (Blank rows = not tested)  LOWER EXTREMITY ROM:      Limited due to body habitus  LOWER EXTREMITY MMT:    MMT Right eval Left eval 06/28/22 Right / left 07/19/22 Right / Left 08/21/22 Right/left  Hip flexion 3+ 3+ 4-/5 35.6 / 41.7 42.3/42.2  Hip extension       Hip abduction    35.9 / 33/9 35.7/37.0  Hip adduction       Hip internal rotation       Hip external rotation       Knee flexion 4- 4- 4+ / 4+  24.7/34.0  Knee extension 4+ 4+ 5- / 5- 25.6 / 30.1 31.0/36.3  Ankle dorsiflexion       Ankle plantarflexion       Ankle inversion       Ankle eversion        (Blank rows = not tested)  LUMBAR SPECIAL TESTS:  Slump test: Negative  FUNCTIONAL TESTS:  5 times sit to stand: 24.16 seated on scooter Timed up and go (TUG): 20.90 from seated on scooter Berg: 35/56  06/21/22:  5x STS:  18.40 seated on  scooter    TUG 12.91 from seated on scooter  06/28/22: Berg: 40/56    07/19/22 TUG: 14.85      5 x STS: 17.11  7/3: Sharlene Motts 47/56  GAIT: Distance walked: 30 ft Assistive device utilized: None Level of assistance:  none Comments: shoulder retraction, antalgic gait pattern  TODAY'S TREATMENT:       08/30/22 Pt seen for aquatic therapy today.  Treatment took place in water 3.5-4.75 ft in depth at the [facility] pool. Temp of water was 91.  Pt entered/exited the pool via stairs with hand rail.  * without support:  walking forward/ backward with reciprocal arm swing *side step with shoulder add/abd with yellow hand floats 3.6 ft * forward marching and forward walking kicks * UE on yellow hand floats:  leg swings into hip flex/ext and hip abdct/ addct; heel raises; squats (with vertical trunk)  * Lt forward step ups x 10 with BUE on rails * UE on blue hand floats:  knee tucks ->  side to side pendulums x 4 each;  front to back pendulums x 4 with cues and demo for improved technique * return to walking forward/ backward    Pt requires the buoyancy and hydrostatic pressure of water for support, and to offload joints by unweighting joint load by at least 50 % in navel deep water and by at least 75-80% in chest to neck deep water.  Viscosity of the water is  needed for resistance of strengthening. Water current perturbations provides challenge to standing balance requiring increased core activation.     7/3:  BERG Balance Test          Date: 7/3  Sit to Stand 4  Standing unsupported 4  Sitting with back unsupported but feet supported 4  Stand to sit  3  Transfers  4  Standing unsupported with eyes closed 4  Standing unsupported feet together 4  From standing position, reach forward with outstretched arm 3 (9 inches)  From standing position, pick up object from floor 3  From standing position, turn and look behind over each shoulder 4  Turn 360 4  Standing unsupported,  alternately place foot on step 3 (4inch step)  Standing unsupported, one foot in front 3  Standing on one leg 0  Total:  47/56     Standing marching 2x10 Standing hip abduction 2x10ea  Step ups 4 inch step 2 x 10 bilateral Gait to locker room at end of session, 1 seated rest break   6/28: Gait with/without transport wc- 325ft (full lap around clinic. 2 standing rest breaks towards end. Only used w/c initial 20ft, then stopped due to hand pain)  Seated HSS 30sec x3 R  Standing marching x20   Standing hip abduction 2x10ea    6/26: Gait with transport wc- 372ft (full lap around clinic. Frequent standing breaks but no seated breaks.)  Updated muscle testing Standing L stretch at rail Standing marching x20  LAQ 2# x15ea Standing gastroc stretch- 2x30s R LE Standing hip abduction 2x10ea  6/21: * without support:  walking forward/ backward with reciprocal arm swing *side step with shoulder add/abd without and with yellow hand floats * forward marching and forward walking kicks * UE on yellow hand floats:  leg swings into hip flex/ext and hip abdct/ addct; heel raises; squats (with vertical trunk)  * Lt forward step ups x 10 with BUE on rails * UE on blue hand floats:  knee tucks ->  side to side pendulums x 4 each;  front to back pendulums x 4 with cues and demo for improved technique * return to walking forward/ backward    PATIENT EDUCATION:  Education details: Aquatic therapy progressions/ modifications Person educated: Patient Education method: Explanation Education comprehension: verbalized understanding  HOME EXERCISE PROGRAM: Pt will climb stairs 1 -2 x weekly 06/28/22  ASSESSMENT:  CLINICAL IMPRESSION: PN: Pt reports getting membership at [employer].  Has been there a couple of times working in pool. She demonstrates good progress with all functional tests demonstrating improved lower extremity function, mobility and decreased fall risk (as per improvement in [place]  and TUG listed above). Objective testing improvements seen in strength (see chart above).  She has not yet met all goals.  She has been enocuraged to begin climbing stairs at home to gain access to shower. Will re-assess for DC/re-cert at [facility] in 2 weeks. Plan: continue working on stair climbing up to 1 flight.  Land based work on 4-6 inch step with hand rail also instruct with single point cane.     OBJECTIVE IMPAIRMENTS: decreased activity tolerance, decreased balance, decreased endurance, decreased knowledge of use of DME, decreased mobility, difficulty walking, decreased strength, impaired sensation, postural dysfunction, obesity, and pain.   ACTIVITY LIMITATIONS: carrying, lifting, bending, sitting, standing, squatting, stairs, transfers, bed mobility, locomotion level, and caring for others  PARTICIPATION LIMITATIONS: meal prep, cleaning, laundry, shopping, community activity, and occupation  PERSONAL FACTORS: Age, Behavior pattern, Fitness, Time  since onset of injury/illness/exacerbation, and 1-2 comorbidities: cardiomyopathy, Oa knees and back, depression/anxiety  are also affecting patient's functional outcome.   REHAB POTENTIAL: Good  CLINICAL DECISION MAKING: Evolving/moderate complexity  EVALUATION COMPLEXITY: Moderate   GOALS: Goals reviewed with patient? Yes  SHORT TERM GOALS: Target date: 06/24/22  Pt will tolerate full aquatic sessions consistently without increase in pain and with improving function to demonstrate good toleration and effectiveness of intervention.   Baseline:TBA Goal status:MET - 06/14/22  2.  Pt will climb stairs using combination of step to and alternating pattern without pausing consistently Baseline: improving - some pauses, but not consistent - 06/19/22 Goal status:ONGOING   3.  Pt will report climbing stairs at home 1-2 x weekly to access bedroom and full bath more regularly. Baseline: <1x weekly Pt reports she has been showering at therapy and  has not been climbing stair Goal status: ongoing 07/19/22  4.  Pt will tolerate walking 8-10 continuous minutes in pool to demonstrate improving toleration to activity Baseline: na Goal status: MET - 06/14/22  5.  Pt will improve on 5 X STS test to <or=   18s to demonstrate improving functional lower extremity strength, transitional movements, and balance Baseline: 24.16 at eval;  18.4s on 06/21/22 Goal status: MET 07/19/22 (17.11)  6.  Pt will improve on Tug test to <or= 14s to demonstrate improvement in lower extremity function, mobility and decreased fall risk.  Baseline:  20.90 at eval;  12.91 on 06/21/22 Goal status: MET   LONG TERM GOALS: Target date: 09/13/22  Pt to meet stated Foto Goal of 39% to demonstrate improved perception of ability Baseline: 22% Goal status: Ongoing 06/28/22  2.  Pt will be indep with final HEP's (land and aquatic as appropriate) for continued management of condition  Baseline:  Goal status: in process 07/19/22  3.  Pt will walk in store for up to or >15 mins pushing cart Baseline: rides scooter Goal status: Ongoing -06/28/22  4.  Pt will report decrease in pain with amb to <5/10 to demonstrate improvement management of condition Baseline: 9/10 Goal status: ongoing  06/28/22  5.  Pt will improve on Berg balance test to >/= 50/56 to demonstrate a decrease in fall risk.  Baseline: 35/56 Goal status: in progress 08/28/22 (47/56)  6.  Pt will increase strength of hip by 1 full grade to improve functional mobility. Baseline: see chart Goal status: deferred (changes measurement tool) 07/19/22  7.Pt will improve strength in all areas listed by 10lb to demonstrate improved overall physical function  Baseline: see chart  Goal Status: NEW   PLAN:  PT FREQUENCY: 1-2x/week  PT DURATION: 8 weeks alternating settings land and aquatics  PLANNED INTERVENTIONS: Therapeutic exercises, Therapeutic activity, Neuromuscular re-education, Balance training, Gait training,  Patient/Family education, Self Care, Joint mobilization, Stair training, DME instructions, Aquatic Therapy, Dry Needling, Cryotherapy, Moist heat, Taping, Ultrasound, Biofeedback, Ionotophoresis 4mg /ml Dexamethasone, Manual therapy, and Re-evaluation.  PLAN FOR NEXT SESSION: Progress strength, ROM, balance, gait and pain reduction land and aquatics.  Encourage use of community pool in between therapy sessions, with use of rollator to/from car to facility.   Corrie Dandy Bradley) Louana Fontenot MPT 08/30/22 3:03 PM [facility] [address] [place], [place], [address] Phone: [phone]   Fax:  [phone]

## 2022-09-04 ENCOUNTER — Ambulatory Visit (HOSPITAL_BASED_OUTPATIENT_CLINIC_OR_DEPARTMENT_OTHER): Payer: Medicare Other

## 2022-09-04 ENCOUNTER — Encounter (HOSPITAL_BASED_OUTPATIENT_CLINIC_OR_DEPARTMENT_OTHER): Payer: Self-pay

## 2022-09-04 DIAGNOSIS — M25561 Pain in right knee: Secondary | ICD-10-CM | POA: Diagnosis not present

## 2022-09-04 DIAGNOSIS — M6281 Muscle weakness (generalized): Secondary | ICD-10-CM | POA: Diagnosis not present

## 2022-09-04 DIAGNOSIS — G8929 Other chronic pain: Secondary | ICD-10-CM | POA: Diagnosis not present

## 2022-09-04 DIAGNOSIS — M545 Low back pain, unspecified: Secondary | ICD-10-CM | POA: Diagnosis not present

## 2022-09-04 DIAGNOSIS — M5459 Other low back pain: Secondary | ICD-10-CM

## 2022-09-04 NOTE — Therapy (Signed)
OUTPATIENT PHYSICAL THERAPY THORACOLUMBAR TREATMENT   Patient Name: [name] MRN: [id number] DOB:12/27/1965, 57 y.o., female Today's Date: 09/04/2022  END OF SESSION:  PT End of Session - 09/04/22 1136     Visit Number 24    Number of Visits 29    Date for PT Re-Evaluation 09/13/22    Authorization Type MCR    Progress Note Due on Visit 33    PT Start Time 1102    PT Stop Time 1150    PT Time Calculation (min) 48 min    Activity Tolerance Patient tolerated treatment well    Behavior During Therapy WFL for tasks assessed/performed              Past Medical History:  Diagnosis Date   ADD (attention deficit disorder)    Anxiety    Arthritis    both knees right worse than left   Carpal tunnel syndrome of right wrist    Depression    Essential hypertension, benign    Gallstones    History of smoking 06/22/2016   Hyperlipidemia associated with type 2 diabetes mellitus (HCC), on Zocor 04/04/2011   Hypertension associated with diabetes (HCC) 06/03/2008   Migraines    Morbid obesity (HCC)    Morbid obesity with BMI of 50.0-59.9, adult (HCC) 07/12/2006   NICM (nonischemic cardiomyopathy) (HCC) 07/12/2006   01/16/18 ECHO:    - Procedure narrative: Transthoracic echocardiography. Image   quality was suboptimal. The study was technically difficult.   Intravenous contrast (Definity) was administered. - Left ventricle: The cavity size was moderately dilated. Wall   thickness was increased in a pattern of mild LVH. Systolic   function was moderately to severely reduced. The estimated   ejection fraction w   Normal coronary arteries 04/17/2016   OSA on CPAP 10/28/2014   Spinal stenosis    back pain   Spondylosis without myelopathy or radiculopathy, lumbar region 12/04/2017   Type 2 diabetes mellitus with hyperglycemia [facility])    Past Surgical History:  Procedure Laterality Date   CHOLECYSTECTOMY     DILATATION & CURETTAGE/HYSTEROSCOPY WITH MYOSURE N/A 10/31/2017   Procedure:  DILATATION & CURETTAGE/HYSTEROSCOPY WITH MYOSURE;  Surgeon: Romualdo Bolk, MD;  Location: [facility];  Service: Gynecology;  Laterality: N/A;   HYSTEROSCOPY WITH D & C N/A 05/07/2022   Procedure: DILATATION AND CURETTAGE /HYSTEROSCOPY;  Surgeon: [name], MD;  Location: [facility] OR;  Service: Gynecology;  Laterality: N/A;   RIGHT/LEFT HEART CATH AND CORONARY ANGIOGRAPHY N/A 04/11/2016   Procedure: Right/Left Heart Cath and Coronary Angiography;  Surgeon: Kathleene Hazel, MD;  Location: [facility] INVASIVE CV LAB;  Service: Cardiovascular;  Laterality: N/A;   sonogram for blood clots     no blockages   Patient Active Problem List   Diagnosis Date Noted   Rosacea, acne 08/13/2022   BRBPR (bright red blood per rectum) 08/13/2022   Primary osteoarthritis of both knees 03/06/2022   Bilateral ovarian cysts 03/06/2022   Major depressive disorder, recurrent episode, severe with anxious distress (HCC) 08/09/2021   Attention deficit hyperactivity disorder (ADHD), predominantly inattentive type 08/09/2021   Need for varicella vaccine 08/09/2021   Urge incontinence 08/01/2021   Tinea corporis 03/14/2021   Hyperlipidemia LDL goal <100 03/01/2021   DDD (degenerative disc disease), lumbar 08/08/2020   Non-seasonal allergic rhinitis due to pollen 07/25/2020   Chronic idiopathic constipation 07/25/2020   Visit for screening mammogram 02/11/2020   Chronic bilateral low back pain without sciatica 02/10/2020   Spondylosis  without myelopathy or radiculopathy, lumbar region 12/04/2017   Type 2 diabetes mellitus with hyperglycemia (HCC)    Normal coronary arteries 04/17/2016   OSA on CPAP 10/28/2014   Chronic systolic CHF (congestive heart failure), NYHA class 3 (HCC) 12/06/2013   Hyperlipidemia associated with type 2 diabetes mellitus (HCC), on Zocor 04/04/2011   Hypertension associated with diabetes (HCC) 06/03/2008   Morbid obesity with BMI of 50.0-59.9, adult (HCC) 07/12/2006   NICM (nonischemic  cardiomyopathy) (HCC) 07/12/2006   Migraine without status migrainosus, not intractable 07/12/2006    PCP: [name], MD   REFERRING PROVIDER: Rodolph Bong, MD   REFERRING DIAG:   Chronic pain of right knee  M54.50,G89.29 (ICD-10-CM) - Chronic bilateral low back pain without sciatica    Rationale for Evaluation and Treatment: Rehabilitation  THERAPY DIAG:  Other low back pain  Muscle weakness (generalized)  Chronic pain of right knee  Chronic bilateral low back pain without sciatica  ONSET DATE: >5  SUBJECTIVE:                                                                                                                                                                                           SUBJECTIVE STATEMENT: 8/10 pain level in low back. Some pain in back of R knee. Feels occasional SOB especially when out in heat, but can happen indoors at rest as well.     PERTINENT HISTORY:  [name] 03/19/22: 57 y.o. female with chronic low back pain and chronic right knee pain  . . . bothersome paresthesias sensation bilateral lower extremities.  . . . Right knee pain due to exacerbation of DJD.  . . . Chronic back pain thought to be due to core weakness and facet arthritis changes seen on CT scan.   PAIN:  Are you having pain? Yes: NPRS scale: 8/10 Pain location: LB to bilat hips, Rt shoulder, neck  Pain description: ache, pressure Aggravating factors: standing and moving, walking through home Relieving factors: sitting  PRECAUTIONS: Knee and Back  WEIGHT BEARING RESTRICTIONS: No  FALLS:  Has patient fallen in last 6 months? No  LIVING ENVIRONMENT: Lives with: lives with their family and lives with their spouse Lives in: House/apartment Stairs: Yes: Internal: 16 steps; on right going up Has following equipment at home: Dan Humphreys - 4 wheeled and shower chair  OCCUPATION: disabled  PLOF: Needs assistance with ADLs, Needs assistance with homemaking, and  Needs assistance with gait  PATIENT GOALS: increased movement without pain, ADL's, cook, climb stairs, go to the store without hurting  NEXT MD VISIT: 6 weeks  OBJECTIVE:   DIAGNOSTIC FINDINGS:  X-ray images right knee: Mild medial compartment DJD.  Mild patellofemoral DJD  CT scan images of lumbar spine: Diffuse degenerative changes present throughout lumbar spine   PATIENT SURVEYS:  FOTO Primary score 22% with goal 39% 06/28/22: 22% 08/07/22: Lumbar: 16%, knee: 29%  SCREENING FOR RED FLAGS: none  COGNITION: Overall cognitive status: Within functional limits for tasks assessed     SENSATION: wfl  POSTURE: increased thoracic kyphosis,right shoulder elevated  PALPATION: No tenderness although difficulty to assess due to body habitus  LUMBAR ROM:   AROM eval 06/28/22 07/19/22  Flexion Mid shin    Extension 0 10d wfl  Right lateral flexion 50% 75% full  Left lateral flexion 50% 75% full  Right rotation 100%    Left rotation 100%     (Blank rows = not tested)  LOWER EXTREMITY ROM:      Limited due to body habitus  LOWER EXTREMITY MMT:    MMT Right eval Left eval 06/28/22 Right / left 07/19/22 Right / Left 08/21/22 Right/left  Hip flexion 3+ 3+ 4-/5 35.6 / 41.7 42.3/42.2  Hip extension       Hip abduction    35.9 / 33/9 35.7/37.0  Hip adduction       Hip internal rotation       Hip external rotation       Knee flexion 4- 4- 4+ / 4+  24.7/34.0  Knee extension 4+ 4+ 5- / 5- 25.6 / 30.1 31.0/36.3  Ankle dorsiflexion       Ankle plantarflexion       Ankle inversion       Ankle eversion        (Blank rows = not tested)  LUMBAR SPECIAL TESTS:  Slump test: Negative  FUNCTIONAL TESTS:  5 times sit to stand: 24.16 seated on scooter Timed up and go (TUG): 20.90 from seated on scooter Berg: 35/56  06/21/22:  5x STS:  18.40 seated on scooter    TUG 12.91 from seated on scooter  06/28/22: Sharlene Motts: 40/56    07/19/22 TUG: 14.85      5 x STS: 17.11  7/3: Sharlene Motts  47/56  GAIT: Distance walked: 30 ft Assistive device utilized: None Level of assistance:  none Comments: shoulder retraction, antalgic gait pattern  TODAY'S TREATMENT:        09/04/22  Gait traiing with SPC 66ft Without SPC x254ft Standing marching 2x10 Standing HR/TR x20  Step ups 6 inch step 1 x 10 bilateral with single rail support LAQ 2.5# x12 bil Seated lumbar stretching   08/30/22 Pt seen for aquatic therapy today.  Treatment took place in water 3.5-4.75 ft in depth at the [facility] pool. Temp of water was 91.  Pt entered/exited the pool via stairs with hand rail.  * without support:  walking forward/ backward with reciprocal arm swing *side step with shoulder add/abd with yellow hand floats 3.6 ft * forward marching and forward walking kicks * UE on yellow hand floats:  leg swings into hip flex/ext and hip abdct/ addct; heel raises; squats (with vertical trunk)  * Lt forward step ups x 10 with BUE on rails * UE on blue hand floats:  knee tucks ->  side to side pendulums x 4 each;  front to back pendulums x 4 with cues and demo for improved technique * return to walking forward/ backward    Pt requires the buoyancy and hydrostatic pressure of water for support, and to offload joints by unweighting joint load by at  least 50 % in navel deep water and by at least 75-80% in chest to neck deep water.  Viscosity of the water is needed for resistance of strengthening. Water current perturbations provides challenge to standing balance requiring increased core activation.     7/3:  BERG Balance Test          Date: 7/3  Sit to Stand 4  Standing unsupported 4  Sitting with back unsupported but feet supported 4  Stand to sit  3  Transfers  4  Standing unsupported with eyes closed 4  Standing unsupported feet together 4  From standing position, reach forward with outstretched arm 3 (9 inches)  From standing position, pick up object from floor 3  From standing  position, turn and look behind over each shoulder 4  Turn 360 4  Standing unsupported, alternately place foot on step 3 (4inch step)  Standing unsupported, one foot in front 3  Standing on one leg 0  Total:  47/56     Standing marching 2x10 Standing hip abduction 2x10ea  Step ups 4 inch step 2 x 10 bilateral Gait to locker room at end of session, 1 seated rest break   6/28: Gait with/without transport wc- 366ft (full lap around clinic. 2 standing rest breaks towards end. Only used w/c initial 47ft, then stopped due to hand pain)  Seated HSS 30sec x3 R  Standing marching x20   Standing hip abduction 2x10ea    6/26: Gait with transport wc- 387ft (full lap around clinic. Frequent standing breaks but no seated breaks.)  Updated muscle testing Standing L stretch at rail Standing marching x20  LAQ 2# x15ea Standing gastroc stretch- 2x30s R LE Standing hip abduction 2x10ea  6/21: * without support:  walking forward/ backward with reciprocal arm swing *side step with shoulder add/abd without and with yellow hand floats * forward marching and forward walking kicks * UE on yellow hand floats:  leg swings into hip flex/ext and hip abdct/ addct; heel raises; squats (with vertical trunk)  * Lt forward step ups x 10 with BUE on rails * UE on blue hand floats:  knee tucks ->  side to side pendulums x 4 each;  front to back pendulums x 4 with cues and demo for improved technique * return to walking forward/ backward    PATIENT EDUCATION:  Education details: Aquatic therapy progressions/ modifications Person educated: Patient Education method: Explanation Education comprehension: verbalized understanding  HOME EXERCISE PROGRAM: Pt will climb stairs 1 -2 x weekly 06/28/22  ASSESSMENT:  CLINICAL IMPRESSION:   Attempted to initaite gait training wth SPC,; however, pt was not receptive to this after initial trial. She stated "You know I'm not actually going to use this, right?"  "It's making my back hurt a lot." Educated pt on improtance of learning to use [facility] for improvement in ambulation to progress from scooter for longer distances. Pt opted to continue gait training without AD. Pt was able to climb 1/2 flight of stairs in clinic today with single and bilateral UE support. She did partial reciprocal pattern with ascent, and required step to patten with descent. Able to complete step ups at 6" step in clinic with single UE support, but did report increase in back pain following this. Instructed pt to monitor SOB and alert MD of this.   PN: Pt reports getting membership at [facility].  Has been there a couple of times working in pool. She demonstrates good progress with all functional tests demonstrating improved lower  extremity function, mobility and decreased fall risk (as per improvement in Berg and TUG listed above). Objective testing improvements seen in strength (see chart above).  She has not yet met all goals.  She has been enocuraged to begin climbing stairs at home to gain access to shower. Will re-assess for DC/re-cert at [facility] in 2 weeks. Plan: continue working on stair climbing up to 1 flight.  Land based work on 4-6 inch step with hand rail also instruct with single point cane.     OBJECTIVE IMPAIRMENTS: decreased activity tolerance, decreased balance, decreased endurance, decreased knowledge of use of DME, decreased mobility, difficulty walking, decreased strength, impaired sensation, postural dysfunction, obesity, and pain.   ACTIVITY LIMITATIONS: carrying, lifting, bending, sitting, standing, squatting, stairs, transfers, bed mobility, locomotion level, and caring for others  PARTICIPATION LIMITATIONS: meal prep, cleaning, laundry, shopping, community activity, and occupation  PERSONAL FACTORS: Age, Behavior pattern, Fitness, Time since onset of injury/illness/exacerbation, and 1-2 comorbidities: cardiomyopathy, Oa knees and back, depression/anxiety  are also  affecting patient's functional outcome.   REHAB POTENTIAL: Good  CLINICAL DECISION MAKING: Evolving/moderate complexity  EVALUATION COMPLEXITY: Moderate   GOALS: Goals reviewed with patient? Yes  SHORT TERM GOALS: Target date: 06/24/22  Pt will tolerate full aquatic sessions consistently without increase in pain and with improving function to demonstrate good toleration and effectiveness of intervention.   Baseline:TBA Goal status:MET - 06/14/22  2.  Pt will climb stairs using combination of step to and alternating pattern without pausing consistently Baseline: improving - some pauses, but not consistent - 06/19/22 Goal status:ONGOING   3.  Pt will report climbing stairs at home 1-2 x weekly to access bedroom and full bath more regularly. Baseline: <1x weekly Pt reports she has been showering at therapy and has not been climbing stair Goal status: ongoing 07/19/22  4.  Pt will tolerate walking 8-10 continuous minutes in pool to demonstrate improving toleration to activity Baseline: na Goal status: MET - 06/14/22  5.  Pt will improve on 5 X STS test to <or=   18s to demonstrate improving functional lower extremity strength, transitional movements, and balance Baseline: 24.16 at eval;  18.4s on 06/21/22 Goal status: MET 07/19/22 (17.11)  6.  Pt will improve on Tug test to <or= 14s to demonstrate improvement in lower extremity function, mobility and decreased fall risk.  Baseline:  20.90 at eval;  12.91 on 06/21/22 Goal status: MET   LONG TERM GOALS: Target date: 09/13/22  Pt to meet stated Foto Goal of 39% to demonstrate improved perception of ability Baseline: 22% Goal status: Ongoing 06/28/22  2.  Pt will be indep with final HEP's (land and aquatic as appropriate) for continued management of condition  Baseline:  Goal status: in process 07/19/22  3.  Pt will walk in store for up to or >15 mins pushing cart Baseline: rides scooter Goal status: Ongoing -06/28/22  4.  Pt will  report decrease in pain with amb to <5/10 to demonstrate improvement management of condition Baseline: 9/10 Goal status: ongoing  06/28/22  5.  Pt will improve on Berg balance test to >/= 50/56 to demonstrate a decrease in fall risk.  Baseline: 35/56 Goal status: in progress 08/28/22 (47/56)  6.  Pt will increase strength of hip by 1 full grade to improve functional mobility. Baseline: see chart Goal status: deferred (changes measurement tool) 07/19/22  7.Pt will improve strength in all areas listed by 10lb to demonstrate improved overall physical function  Baseline: see chart  Goal  Status: NEW   PLAN:  PT FREQUENCY: 1-2x/week  PT DURATION: 8 weeks alternating settings land and aquatics  PLANNED INTERVENTIONS: Therapeutic exercises, Therapeutic activity, Neuromuscular re-education, Balance training, Gait training, Patient/Family education, Self Care, Joint mobilization, Stair training, DME instructions, Aquatic Therapy, Dry Needling, Cryotherapy, Moist heat, Taping, Ultrasound, Biofeedback, Ionotophoresis 4mg /ml Dexamethasone, Manual therapy, and Re-evaluation.  PLAN FOR NEXT SESSION: Progress strength, ROM, balance, gait and pain reduction land and aquatics.  Encourage use of community pool in between therapy sessions, with use of rollator to/from car to facility.   Riki Altes, PTA  09/04/22 11:55 AM [facility] [address] [place], [place], [address] Phone: [phone]   Fax:  [phone]

## 2022-09-05 ENCOUNTER — Ambulatory Visit: Payer: Medicare Other | Admitting: Licensed Clinical Social Worker

## 2022-09-05 NOTE — Patient Outreach (Signed)
  Care Coordination  Follow Up Visit Note   09/05/2022 Name: [name] MRN: [id number] DOB: April 30, 1965  [name] is a 57 y.o. year old female who sees Samantha Grandchild, MD for primary care. I spoke with  [name] by phone today.  What matters to the patients health and wellness today?    Doing well, making progress with managing health. Prepared for appointment tomorrow and Monday.   Goals Addressed             This Visit's Progress    Care Coordination Activities for mental & physical health support       Activities in order to accomplish goals.   Congratulations on making progress with physical therapy  Keep your appointment scheduled with [facility] [phone]   your next appointment is July 12th at 9:40 for medication management with [name] and  July 16th with your therapist [name]  Continue with compliance of taking medication prescribed by Doctor and keep all medical appointments Keep medical appointment we reviewed today for physical therapy, You have decided to put this on hold to talk with [name] at the [facility]  [phone] for Medicare options.  At this time [name] is the only one that has all of your providers in network this includes $320 for flex dollars for Benefit card; , dental ,  0 doctor visit, 0 for therapy ; Silver Sneakers , foot care &   purse devise with life station ( "fallen and can't get up").        SDOH assessments and interventions completed:  No   Care Coordination Interventions:  Yes, provided   Follow up plan:  Unable to schedule f/u call today.  Will call patient in 3 weeks     Encounter Outcome:  Pt. Visit Completed   Samantha Hines, LCSW Social Work Care Coordination  [facility] [name] [phone]

## 2022-09-05 NOTE — Patient Instructions (Signed)
Social Work Visit Information  Thank you for taking time to visit with me today. Please don't hesitate to contact me if I can be of assistance to you.   Following are the goals we discussed today:   Goals Addressed             This Visit's Progress    Care Coordination Activities for mental & physical health support       Activities in order to accomplish goals.   Congratulations on making progress with physical therapy  Keep your appointment scheduled with [facility] [phone]   your next appointment is July 12th at 9:40 for medication management with Cyndi Lennert and  July 16th with your therapist Genia Del  Continue with compliance of taking medication prescribed by Doctor and keep all medical appointments Keep medical appointment we reviewed today for physical therapy, You have decided to put this on hold to talk with [name] at the [facility]  [phone] for Medicare options.  At this time [name] is the only one that has all of your providers in network this includes $320 for flex dollars for Benefit card; , dental ,  0 doctor visit, 0 for therapy ; Silver Sneakers , foot care &   purse devise with life station ( "fallen and can't get up").         No follow up scheduled with social work at this time. Will follow up in 3 weeks .Patient will call office if needed prior to next encounter.  Please call the care guide team at [phone] if you need to cancel or reschedule your appointment.   If you or anyone you know are experiencing a Mental Health or Behavioral Health Crisis or need someone to talk to, please call the Suicide and Crisis Lifeline: 988 call the [place] National Suicide Prevention Lifeline: 567-875-3279 or TTY: 339-146-3169 TTY 571-470-2733) to talk to a trained counselor call 1-800-273-TALK (toll free, 24 hour hotline) go to [facility] [address], [place] [phone])   Patient verbalizes  understanding of instructions and care plan provided today and agrees to view in MyChart. Active MyChart status and patient understanding of how to access instructions and care plan via MyChart confirmed with patient.       [name], LCSW Social Work Care Coordination  [facility]ie [facility] [employer] [phone]

## 2022-09-06 ENCOUNTER — Ambulatory Visit (HOSPITAL_BASED_OUTPATIENT_CLINIC_OR_DEPARTMENT_OTHER): Payer: Medicare Other | Admitting: Physical Therapy

## 2022-09-06 ENCOUNTER — Encounter: Payer: Self-pay | Admitting: *Deleted

## 2022-09-06 ENCOUNTER — Encounter (HOSPITAL_BASED_OUTPATIENT_CLINIC_OR_DEPARTMENT_OTHER): Payer: Self-pay | Admitting: Physical Therapy

## 2022-09-06 ENCOUNTER — Telehealth: Payer: Self-pay | Admitting: Cardiology

## 2022-09-06 DIAGNOSIS — M25561 Pain in right knee: Secondary | ICD-10-CM | POA: Diagnosis not present

## 2022-09-06 DIAGNOSIS — M545 Low back pain, unspecified: Secondary | ICD-10-CM | POA: Diagnosis not present

## 2022-09-06 DIAGNOSIS — G8929 Other chronic pain: Secondary | ICD-10-CM

## 2022-09-06 DIAGNOSIS — F3181 Bipolar II disorder: Secondary | ICD-10-CM | POA: Diagnosis not present

## 2022-09-06 DIAGNOSIS — M5459 Other low back pain: Secondary | ICD-10-CM

## 2022-09-06 DIAGNOSIS — M6281 Muscle weakness (generalized): Secondary | ICD-10-CM

## 2022-09-06 DIAGNOSIS — Z79899 Other long term (current) drug therapy: Secondary | ICD-10-CM | POA: Diagnosis not present

## 2022-09-06 DIAGNOSIS — F411 Generalized anxiety disorder: Secondary | ICD-10-CM | POA: Diagnosis not present

## 2022-09-06 DIAGNOSIS — F909 Attention-deficit hyperactivity disorder, unspecified type: Secondary | ICD-10-CM | POA: Diagnosis not present

## 2022-09-06 NOTE — Telephone Encounter (Signed)
Patient states she used to take Adderall but felt it stopped working. She has not been on it for years and feel she would like to start back taking Adderall. Her psychiatrist needs a letter from you stating it will be ok to take Adderall. She states while she was off Adderall her life was horrible and she did not want to leave her couch. She was depressed and miserable. This is something she states she discussed with you. The letter will need to be faxed to Merian Capron office # [phone] fax #[phone]

## 2022-09-06 NOTE — Telephone Encounter (Signed)
Left messdage for patient, will generate letter and send to her through my chart.

## 2022-09-06 NOTE — Telephone Encounter (Signed)
Pt c/o medication issue:  1. Name of Medication: Adderall  2. How are you currently taking this medication (dosage and times per day)? NA  3. Are you having a reaction (difficulty breathing--STAT)? No  4. What is your medication issue? Pt states that her Psychiatric NP would like to prescribe her the above medication. Pt wants to make sure that Dr. [name] is okay with this. Pt woul like a callback regarding this matter. Please advise

## 2022-09-06 NOTE — Telephone Encounter (Signed)
Patient called to request the letter be faxed directly to [name] fax #[phone] as she does not use MyChart and she will need to get back on her medications as soon as possible.

## 2022-09-06 NOTE — Therapy (Signed)
OUTPATIENT PHYSICAL THERAPY THORACOLUMBAR TREATMENT   Patient Name: [name] MRN: [id number] DOB:03/31/1965, 57 y.o., female Today's Date: 09/06/2022  END OF SESSION:  PT End of Session - 09/06/22 1119     Visit Number 25    Number of Visits 29    Date for PT Re-Evaluation 09/13/22    Authorization Type MCR    Progress Note Due on Visit 33              Past Medical History:  Diagnosis Date   ADD (attention deficit disorder)    Anxiety    Arthritis    both knees right worse than left   Carpal tunnel syndrome of right wrist    Depression    Essential hypertension, benign    Gallstones    History of smoking 06/22/2016   Hyperlipidemia associated with type 2 diabetes mellitus (HCC), on Zocor 04/04/2011   Hypertension associated with diabetes (HCC) 06/03/2008   Migraines    Morbid obesity (HCC)    Morbid obesity with BMI of 50.0-59.9, adult (HCC) 07/12/2006   NICM (nonischemic cardiomyopathy) (HCC) 07/12/2006   01/16/18 ECHO:    - Procedure narrative: Transthoracic echocardiography. Image   quality was suboptimal. The study was technically difficult.   Intravenous contrast (Definity) was administered. - Left ventricle: The cavity size was moderately dilated. Wall   thickness was increased in a pattern of mild LVH. Systolic   function was moderately to severely reduced. The estimated   ejection fraction w   Normal coronary arteries 04/17/2016   OSA on CPAP 10/28/2014   Spinal stenosis    back pain   Spondylosis without myelopathy or radiculopathy, lumbar region 12/04/2017   Type 2 diabetes mellitus with hyperglycemia [facility])    Past Surgical History:  Procedure Laterality Date   CHOLECYSTECTOMY     DILATATION & CURETTAGE/HYSTEROSCOPY WITH MYOSURE N/A 10/31/2017   Procedure: DILATATION & CURETTAGE/HYSTEROSCOPY WITH MYOSURE;  Surgeon: Romualdo Bolk, MD;  Location: [facility];  Service: Gynecology;  Laterality: N/A;   HYSTEROSCOPY WITH D & C N/A 05/07/2022   Procedure:  DILATATION AND CURETTAGE /HYSTEROSCOPY;  Surgeon: [name], MD;  Location: [facility] OR;  Service: Gynecology;  Laterality: N/A;   RIGHT/LEFT HEART CATH AND CORONARY ANGIOGRAPHY N/A 04/11/2016   Procedure: Right/Left Heart Cath and Coronary Angiography;  Surgeon: Kathleene Hazel, MD;  Location: [facility] INVASIVE CV LAB;  Service: Cardiovascular;  Laterality: N/A;   sonogram for blood clots     no blockages   Patient Active Problem List   Diagnosis Date Noted   Rosacea, acne 08/13/2022   BRBPR (bright red blood per rectum) 08/13/2022   Primary osteoarthritis of both knees 03/06/2022   Bilateral ovarian cysts 03/06/2022   Major depressive disorder, recurrent episode, severe with anxious distress (HCC) 08/09/2021   Attention deficit hyperactivity disorder (ADHD), predominantly inattentive type 08/09/2021   Need for varicella vaccine 08/09/2021   Urge incontinence 08/01/2021   Tinea corporis 03/14/2021   Hyperlipidemia LDL goal <100 03/01/2021   DDD (degenerative disc disease), lumbar 08/08/2020   Non-seasonal allergic rhinitis due to pollen 07/25/2020   Chronic idiopathic constipation 07/25/2020   Visit for screening mammogram 02/11/2020   Chronic bilateral low back pain without sciatica 02/10/2020   Spondylosis without myelopathy or radiculopathy, lumbar region 12/04/2017   Type 2 diabetes mellitus with hyperglycemia (HCC)    Normal coronary arteries 04/17/2016   OSA on CPAP 10/28/2014   Chronic systolic CHF (congestive heart failure), NYHA class 3 (HCC) 12/06/2013  Hyperlipidemia associated with type 2 diabetes mellitus (HCC), on Zocor 04/04/2011   Hypertension associated with diabetes (HCC) 06/03/2008   Morbid obesity with BMI of 50.0-59.9, adult (HCC) 07/12/2006   NICM (nonischemic cardiomyopathy) (HCC) 07/12/2006   Migraine without status migrainosus, not intractable 07/12/2006    PCP: [name], MD   REFERRING PROVIDER: Rodolph Bong, MD   REFERRING DIAG:    Chronic pain of right knee  M54.50,G89.29 (ICD-10-CM) - Chronic bilateral low back pain without sciatica    Rationale for Evaluation and Treatment: Rehabilitation  THERAPY DIAG:  Other low back pain  Muscle weakness (generalized)  Chronic pain of right knee  ONSET DATE: >5  SUBJECTIVE:                                                                                                                                                                                           SUBJECTIVE STATEMENT: Pt reports she had difficulty getting up from couch the evening after last land session.  She reports she had increased LBP.  She reports that she has not been to the [facility] yet since she had been taking care of household duties, etc.  Husband has returned from being out of the country.     PERTINENT HISTORY:  [name] 03/19/22: 57 y.o. female with chronic low back pain and chronic right knee pain  . . . bothersome paresthesias sensation bilateral lower extremities.  . . . Right knee pain due to exacerbation of DJD.  . . . Chronic back pain thought to be due to core weakness and facet arthritis changes seen on CT scan.   PAIN:  Are you having pain? Yes: NPRS scale: 8/10 Pain location: LB to bilat hips, Rt shoulder, neck  Pain description: ache, pressure Aggravating factors: standing and moving, walking through home Relieving factors: sitting  PRECAUTIONS: Knee and Back  WEIGHT BEARING RESTRICTIONS: No  FALLS:  Has patient fallen in last 6 months? No  LIVING ENVIRONMENT: Lives with: lives with their family and lives with their spouse Lives in: House/apartment Stairs: Yes: Internal: 16 steps; on right going up Has following equipment at home: Walker - 4 wheeled and shower chair  OCCUPATION: disabled  PLOF: Needs assistance with ADLs, Needs assistance with homemaking, and Needs assistance with gait  PATIENT GOALS: increased movement without pain, ADL's, cook, climb stairs, go to  the store without hurting  NEXT MD VISIT: 6 weeks  OBJECTIVE:   DIAGNOSTIC FINDINGS:  X-ray images right knee: Mild medial compartment DJD.  Mild patellofemoral DJD  CT scan images of lumbar spine: Diffuse degenerative changes present throughout lumbar spine  PATIENT SURVEYS:  FOTO Primary score 22% with goal 39% 06/28/22: 22% 08/07/22: Lumbar: 16%, knee: 29%  SCREENING FOR RED FLAGS: none  COGNITION: Overall cognitive status: Within functional limits for tasks assessed     SENSATION: wfl  POSTURE: increased thoracic kyphosis,right shoulder elevated  PALPATION: No tenderness although difficulty to assess due to body habitus  LUMBAR ROM:   AROM eval 06/28/22 07/19/22  Flexion Mid shin    Extension 0 10d wfl  Right lateral flexion 50% 75% full  Left lateral flexion 50% 75% full  Right rotation 100%    Left rotation 100%     (Blank rows = not tested)  LOWER EXTREMITY ROM:      Limited due to body habitus  LOWER EXTREMITY MMT:    MMT Right eval Left eval 06/28/22 Right / left 07/19/22 Right / Left 08/21/22 Right/left  Hip flexion 3+ 3+ 4-/5 35.6 / 41.7 42.3/42.2  Hip extension       Hip abduction    35.9 / 33/9 35.7/37.0  Hip adduction       Hip internal rotation       Hip external rotation       Knee flexion 4- 4- 4+ / 4+  24.7/34.0  Knee extension 4+ 4+ 5- / 5- 25.6 / 30.1 31.0/36.3  Ankle dorsiflexion       Ankle plantarflexion       Ankle inversion       Ankle eversion        (Blank rows = not tested)  LUMBAR SPECIAL TESTS:  Slump test: Negative  FUNCTIONAL TESTS:  5 times sit to stand: 24.16 seated on scooter Timed up and go (TUG): 20.90 from seated on scooter Berg: 35/56  06/21/22:  5x STS:  18.40 seated on scooter    TUG 12.91 from seated on scooter  06/28/22: Berg: 40/56    07/19/22 TUG: 14.85      5 x STS: 17.11  7/3: Sharlene Motts 47/56  GAIT: Distance walked: 30 ft Assistive device utilized: None Level of assistance:  none Comments: shoulder  retraction, antalgic gait pattern  TODAY'S TREATMENT:       09/06/22 Pt seen for aquatic therapy today.  Treatment took place in water 3.5-4.75 ft in depth at the [facility] pool. Temp of water was 91.  Pt entered/exited the pool via stairs in step-to pattern with hand rail.  * without support:  walking forward/ backward with reciprocal arm swing *side step with shoulder add/abd with yellow->rainbow hand floats at 4 ft; walking forward / backward with rainbow hand floats under water swinging at sides * wall push up/off x 10 * UE on yellow hand floats:  leg swings into hip flex/ext and hip abdct/ addct; heel raises; squats (with vertical trunk)  * forward marching and forward walking kicks * UE on blue hand floats:  side to side pendulums x 4 each;  front to back pendulums x 4 with cues and demo for improved technique * return to walking forward/ backward    09/04/22  Gait traiing with SPC 26ft Without SPC x271ft Standing marching 2x10 Standing HR/TR x20  Step ups 6 inch step 1 x 10 bilateral with single rail support LAQ 2.5# x12 bil Seated lumbar stretching   08/30/22 Pt seen for aquatic therapy today.  Treatment took place in water 3.5-4.75 ft in depth at the [facility] pool. Temp of water was 91.  Pt entered/exited the pool via stairs with hand rail.  * without support:  walking forward/ backward with reciprocal arm swing *side step with shoulder add/abd with yellow hand floats 3.6 ft * forward marching and forward walking kicks * UE on yellow hand floats:  leg swings into hip flex/ext and hip abdct/ addct; heel raises; squats (with vertical trunk)  * Lt forward step ups x 10 with BUE on rails * UE on blue hand floats:  knee tucks ->  side to side pendulums x 4 each;  front to back pendulums x 4 with cues and demo for improved technique * return to walking forward/ backward    Pt requires the buoyancy and hydrostatic pressure of water for support, and to  offload joints by unweighting joint load by at least 50 % in navel deep water and by at least 75-80% in chest to neck deep water.  Viscosity of the water is needed for resistance of strengthening. Water current perturbations provides challenge to standing balance requiring increased core activation.     7/3:  BERG Balance Test          Date: 7/3  Sit to Stand 4  Standing unsupported 4  Sitting with back unsupported but feet supported 4  Stand to sit  3  Transfers  4  Standing unsupported with eyes closed 4  Standing unsupported feet together 4  From standing position, reach forward with outstretched arm 3 (9 inches)  From standing position, pick up object from floor 3  From standing position, turn and look behind over each shoulder 4  Turn 360 4  Standing unsupported, alternately place foot on step 3 (4inch step)  Standing unsupported, one foot in front 3  Standing on one leg 0  Total:  47/56     Standing marching 2x10 Standing hip abduction 2x10ea  Step ups 4 inch step 2 x 10 bilateral Gait to locker room at end of session, 1 seated rest break   6/28: Gait with/without transport wc- 336ft (full lap around clinic. 2 standing rest breaks towards end. Only used w/c initial 76ft, then stopped due to hand pain)  Seated HSS 30sec x3 R  Standing marching x20   Standing hip abduction 2x10ea    6/26: Gait with transport wc- 363ft (full lap around clinic. Frequent standing breaks but no seated breaks.)  Updated muscle testing Standing L stretch at rail Standing marching x20  LAQ 2# x15ea Standing gastroc stretch- 2x30s R LE Standing hip abduction 2x10ea  6/21: * without support:  walking forward/ backward with reciprocal arm swing *side step with shoulder add/abd without and with yellow hand floats * forward marching and forward walking kicks * UE on yellow hand floats:  leg swings into hip flex/ext and hip abdct/ addct; heel raises; squats (with vertical trunk)  *  Lt forward step ups x 10 with BUE on rails * UE on blue hand floats:  knee tucks ->  side to side pendulums x 4 each;  front to back pendulums x 4 with cues and demo for improved technique * return to walking forward/ backward    PATIENT EDUCATION:  Education details: Aquatic therapy progressions/ modifications Person educated: Patient Education method: Explanation Education comprehension: verbalized understanding  HOME EXERCISE PROGRAM:  Aquatic Access Code: [email or web address] URL: [email or web address] Date: 09/06/2022 Prepared by: [facility] [address] This aquatic home exercise program from [employer] utilizes pictures from land based exercises, but has been adapted prior to lamination and issuance.    ASSESSMENT:  CLINICAL IMPRESSION: Pt continues to get significant relief of back  pain while exercising in the water - pain reduced from 8/10 to 1/10.  She is given minor cues for form of exercise, but she keeps track of repetitions when reminded.  She was issued a laminated copy of aquatic HEP today.  Plan to review at next pool appt in preparation for d/c from aquatics.  End of POC is next week; land therapist to assess goals as able next week and create/issue land HEP.   Pt making good progress towards remaining goals.      OBJECTIVE IMPAIRMENTS: decreased activity tolerance, decreased balance, decreased endurance, decreased knowledge of use of DME, decreased mobility, difficulty walking, decreased strength, impaired sensation, postural dysfunction, obesity, and pain.   ACTIVITY LIMITATIONS: carrying, lifting, bending, sitting, standing, squatting, stairs, transfers, bed mobility, locomotion level, and caring for others  PARTICIPATION LIMITATIONS: meal prep, cleaning, laundry, shopping, community activity, and occupation  PERSONAL FACTORS: Age, Behavior pattern, Fitness, Time since onset of injury/illness/exacerbation, and 1-2 comorbidities:  cardiomyopathy, Oa knees and back, depression/anxiety  are also affecting patient's functional outcome.   REHAB POTENTIAL: Good  CLINICAL DECISION MAKING: Evolving/moderate complexity  EVALUATION COMPLEXITY: Moderate   GOALS: Goals reviewed with patient? Yes  SHORT TERM GOALS: Target date: 06/24/22  Pt will tolerate full aquatic sessions consistently without increase in pain and with improving function to demonstrate good toleration and effectiveness of intervention.   Baseline:TBA Goal status:MET - 06/14/22  2.  Pt will climb stairs using combination of step to and alternating pattern without pausing consistently Baseline: improving - some pauses, but not consistent - 06/19/22 Goal status:ONGOING   3.  Pt will report climbing stairs at home 1-2 x weekly to access bedroom and full bath more regularly. Baseline: <1x weekly Pt reports she has been showering at therapy and has not been climbing stair Goal status: ongoing 07/19/22  4.  Pt will tolerate walking 8-10 continuous minutes in pool to demonstrate improving toleration to activity Baseline: na Goal status: MET - 06/14/22  5.  Pt will improve on 5 X STS test to <or=   18s to demonstrate improving functional lower extremity strength, transitional movements, and balance Baseline: 24.16 at eval;  18.4s on 06/21/22 Goal status: MET 07/19/22 (17.11)  6.  Pt will improve on Tug test to <or= 14s to demonstrate improvement in lower extremity function, mobility and decreased fall risk.  Baseline:  20.90 at eval;  12.91 on 06/21/22 Goal status: MET   LONG TERM GOALS: Target date: 09/13/22  Pt to meet stated Foto Goal of 39% to demonstrate improved perception of ability Baseline: 22% Goal status: Ongoing 06/28/22  2.  Pt will be indep with final HEP's (land and aquatic as appropriate) for continued management of condition  Baseline:  Goal status: in process 07/19/22  3.  Pt will walk in store for up to or >15 mins pushing cart Baseline:  rides scooter Goal status: Ongoing -06/28/22  4.  Pt will report decrease in pain with amb to <5/10 to demonstrate improvement management of condition Baseline: 9/10 Goal status: ongoing  06/28/22  5.  Pt will improve on Berg balance test to >/= 50/56 to demonstrate a decrease in fall risk.  Baseline: 35/56 Goal status: in progress 08/28/22 (47/56)  6.  Pt will increase strength of hip by 1 full grade to improve functional mobility. Baseline: see chart Goal status: deferred (changes measurement tool) 07/19/22  7.Pt will improve strength in all areas listed by 10lb to demonstrate improved overall physical function  Baseline: see chart  Goal Status: NEW   PLAN:  PT FREQUENCY: 1-2x/week  PT DURATION: 8 weeks alternating settings land and aquatics  PLANNED INTERVENTIONS: Therapeutic exercises, Therapeutic activity, Neuromuscular re-education, Balance training, Gait training, Patient/Family education, Self Care, Joint mobilization, Stair training, DME instructions, Aquatic Therapy, Dry Needling, Cryotherapy, Moist heat, Taping, Ultrasound, Biofeedback, Ionotophoresis 4mg /ml Dexamethasone, Manual therapy, and Re-evaluation.  PLAN FOR NEXT SESSION: finalize land HEP (in preparation for potential d/c).  Assess goals as able.    Mayer Camel, PTA 09/06/22 12:34 PM [facility] [address] [place], [place], [address] Phone: [phone]   Fax:  [phone]

## 2022-09-06 NOTE — Telephone Encounter (Signed)
Letter has been faxed.

## 2022-09-09 ENCOUNTER — Ambulatory Visit (HOSPITAL_BASED_OUTPATIENT_CLINIC_OR_DEPARTMENT_OTHER): Payer: Self-pay | Admitting: Physical Therapy

## 2022-09-09 ENCOUNTER — Ambulatory Visit
Admission: RE | Admit: 2022-09-09 | Discharge: 2022-09-09 | Disposition: A | Discharge status: Home / Self Care | Payer: Medicare Other | Source: Ambulatory Visit | Attending: Family Medicine | Admitting: Family Medicine

## 2022-09-09 DIAGNOSIS — M47816 Spondylosis without myelopathy or radiculopathy, lumbar region: Secondary | ICD-10-CM | POA: Diagnosis not present

## 2022-09-09 DIAGNOSIS — M545 Low back pain, unspecified: Secondary | ICD-10-CM | POA: Diagnosis not present

## 2022-09-09 DIAGNOSIS — G8929 Other chronic pain: Secondary | ICD-10-CM | POA: Diagnosis not present

## 2022-09-09 DIAGNOSIS — M47896 Other spondylosis, lumbar region: Secondary | ICD-10-CM | POA: Diagnosis not present

## 2022-09-09 MED ORDER — IOPAMIDOL (ISOVUE-M 200) INJECTION 41%
1.0000 mL | Freq: Once | INTRAMUSCULAR | Status: AC
  Administered 2022-09-09: 1 mL via INTRA_ARTICULAR

## 2022-09-09 MED ORDER — METHYLPREDNISOLONE ACETATE 40 MG/ML INJ SUSP (RADIOLOG
80.0000 mg | Freq: Once | INTRAMUSCULAR | Status: AC
  Administered 2022-09-09: 80 mg via INTRA_ARTICULAR

## 2022-09-09 NOTE — Discharge Instructions (Signed)

## 2022-09-10 ENCOUNTER — Ambulatory Visit: Payer: Self-pay | Admitting: Licensed Clinical Social Worker

## 2022-09-10 ENCOUNTER — Encounter (HOSPITAL_COMMUNITY): Payer: Self-pay

## 2022-09-10 DIAGNOSIS — F3181 Bipolar II disorder: Secondary | ICD-10-CM | POA: Diagnosis not present

## 2022-09-10 DIAGNOSIS — F909 Attention-deficit hyperactivity disorder, unspecified type: Secondary | ICD-10-CM | POA: Diagnosis not present

## 2022-09-10 NOTE — Patient Outreach (Signed)
  Care Coordination  Follow Up Visit Note   09/10/2022 Name: [name] MRN: [id number] DOB: 05/12/1965  [name] is a 57 y.o. year old female who sees [name], MD for primary care. I spoke with  [name] by phone today.  What matters to the patients health and wellness today?  Understanding her mental health medication  Patient is making progress with managing her mental and physical health. Had initial appointment July 12th with mental health provider and has therapy appointment today. Continues  with PT which is also positively impacting her mental health.   Goals Addressed             This Visit's Progress    Care Coordination Activities for mental & physical health support       Activities in order to accomplish goals.   Congratulations on making progress with physical therapy  Keep your appointment scheduled with [facility] [phone]  today  July 16th with your therapist Genia Del  Continue with compliance of taking medication prescribed by Doctor and keep all medical appointments Keep medical appointment we reviewed today for physical therapy, You have decided to put this on hold to talk with [name] at the [facility]  [phone] for Medicare options.  At this time [name] is the only one that has all of your providers in network this includes $320 for flex dollars for Benefit card; , dental ,  0 doctor visit, 0 for therapy ; Silver Sneakers , foot care &   purse devise with life station ( "fallen and can't get up").        SDOH assessments and interventions completed:  No   Care Coordination Interventions:  Yes, provided  Interventions Today    Flowsheet Row Most Recent Value  Chronic Disease   Chronic disease during today's visit Hypertension (HTN), Congestive Heart Failure (CHF), Diabetes  General Interventions   General Interventions Discussed/Reviewed General Interventions Reviewed  Education Interventions   Provided  Verbal Education On Mental Health/Coping with Illness  Mental Health Interventions   Mental Health Discussed/Reviewed Mental Health Reviewed, Coping Strategies, Anxiety  [active listening,  problem solving ,  solution focused]  Pharmacy Interventions   Pharmacy Dicussed/Reviewed Pharmacy Topics Discussed  [facility] appointment with psychiatry they have started her on a new medication]       Follow up plan: Follow up call scheduled for 09/30/22    Encounter Outcome:  Pt. Visit Completed   [name], LCSW Social Work Care Coordination  [facility] [employer] [phone]

## 2022-09-10 NOTE — Patient Instructions (Signed)
Social Work Visit Information  Thank you for taking time to visit with me today. Please don't hesitate to contact me if I can be of assistance to you.   Following are the goals we discussed today:   Goals Addressed             This Visit's Progress    Care Coordination Activities for mental & physical health support       Activities in order to accomplish goals.   Congratulations on making progress with physical therapy  Keep your appointment scheduled with [facility] [phone]  today  July 16th with your therapist Genia Del  Continue with compliance of taking medication prescribed by Doctor and keep all medical appointments Keep medical appointment we reviewed today for physical therapy, You have decided to put this on hold to talk with [name] at the [facility]  [phone] for Medicare options.  At this time [name] is the only one that has all of your providers in network this includes $320 for flex dollars for Benefit card; , dental ,  0 doctor visit, 0 for therapy ; Silver Sneakers , foot care &   purse devise with life station ( "fallen and can't get up").         Our next appointment is by telephone on Aug. 6th  at 3:15   Please call the care guide team at [phone] if you need to cancel or reschedule your appointment.   If you or anyone you know are experiencing a Mental Health or Behavioral Health Crisis or need someone to talk to, please call the Suicide and Crisis Lifeline: 988 call the [place] National Suicide Prevention Lifeline: 540-375-1137 or TTY: 812-174-5076 TTY 309-345-6581) to talk to a trained counselor call 1-800-273-TALK (toll free, 24 hour hotline) go to [facility] [address], [place] [phone])   Patient verbalizes understanding of instructions and care plan provided today and agrees to view in MyChart. Active MyChart status and patient understanding of how to access instructions  and care plan via MyChart confirmed with patient.       [name], LCSW Social Work Care Coordination  [facility] [employer] [phone]

## 2022-09-11 ENCOUNTER — Ambulatory Visit (HOSPITAL_BASED_OUTPATIENT_CLINIC_OR_DEPARTMENT_OTHER): Payer: Medicare Other

## 2022-09-11 ENCOUNTER — Encounter (HOSPITAL_BASED_OUTPATIENT_CLINIC_OR_DEPARTMENT_OTHER): Payer: Self-pay

## 2022-09-11 DIAGNOSIS — M25561 Pain in right knee: Secondary | ICD-10-CM | POA: Diagnosis not present

## 2022-09-11 DIAGNOSIS — M5459 Other low back pain: Secondary | ICD-10-CM

## 2022-09-11 DIAGNOSIS — M6281 Muscle weakness (generalized): Secondary | ICD-10-CM

## 2022-09-11 DIAGNOSIS — M545 Low back pain, unspecified: Secondary | ICD-10-CM | POA: Diagnosis not present

## 2022-09-11 DIAGNOSIS — G8929 Other chronic pain: Secondary | ICD-10-CM | POA: Diagnosis not present

## 2022-09-11 NOTE — Therapy (Signed)
OUTPATIENT PHYSICAL THERAPY THORACOLUMBAR TREATMENT   Patient Name: [name] MRN: [id number] DOB:01/23/1966, 57 y.o., female Today's Date: 09/12/2022  END OF SESSION:  PT End of Session - 09/11/22 1102     Visit Number 26    Number of Visits 29    Date for PT Re-Evaluation 09/13/22    Authorization Type MCR    Progress Note Due on Visit 33    PT Start Time 1102    PT Stop Time 1200    PT Time Calculation (min) 58 min    Activity Tolerance Patient tolerated treatment well    Behavior During Therapy WFL for tasks assessed/performed               Past Medical History:  Diagnosis Date   ADD (attention deficit disorder)    Anxiety    Arthritis    both knees right worse than left   Carpal tunnel syndrome of right wrist    Depression    Essential hypertension, benign    Gallstones    History of smoking 06/22/2016   Hyperlipidemia associated with type 2 diabetes mellitus (HCC), on Zocor 04/04/2011   Hypertension associated with diabetes (HCC) 06/03/2008   Migraines    Morbid obesity (HCC)    Morbid obesity with BMI of 50.0-59.9, adult (HCC) 07/12/2006   NICM (nonischemic cardiomyopathy) (HCC) 07/12/2006   01/16/18 ECHO:    - Procedure narrative: Transthoracic echocardiography. Image   quality was suboptimal. The study was technically difficult.   Intravenous contrast (Definity) was administered. - Left ventricle: The cavity size was moderately dilated. Wall   thickness was increased in a pattern of mild LVH. Systolic   function was moderately to severely reduced. The estimated   ejection fraction w   Normal coronary arteries 04/17/2016   OSA on CPAP 10/28/2014   Spinal stenosis    back pain   Spondylosis without myelopathy or radiculopathy, lumbar region 12/04/2017   Type 2 diabetes mellitus with hyperglycemia [facility])    Past Surgical History:  Procedure Laterality Date   CHOLECYSTECTOMY     DILATATION & CURETTAGE/HYSTEROSCOPY WITH MYOSURE N/A 10/31/2017   Procedure:  DILATATION & CURETTAGE/HYSTEROSCOPY WITH MYOSURE;  Surgeon: Romualdo Bolk, MD;  Location: [facility];  Service: Gynecology;  Laterality: N/A;   HYSTEROSCOPY WITH D & C N/A 05/07/2022   Procedure: DILATATION AND CURETTAGE /HYSTEROSCOPY;  Surgeon: [name], MD;  Location: [facility] OR;  Service: Gynecology;  Laterality: N/A;   RIGHT/LEFT HEART CATH AND CORONARY ANGIOGRAPHY N/A 04/11/2016   Procedure: Right/Left Heart Cath and Coronary Angiography;  Surgeon: Kathleene Hazel, MD;  Location: [facility] INVASIVE CV LAB;  Service: Cardiovascular;  Laterality: N/A;   sonogram for blood clots     no blockages   Patient Active Problem List   Diagnosis Date Noted   Rosacea, acne 08/13/2022   BRBPR (bright red blood per rectum) 08/13/2022   Primary osteoarthritis of both knees 03/06/2022   Bilateral ovarian cysts 03/06/2022   Major depressive disorder, recurrent episode, severe with anxious distress (HCC) 08/09/2021   Attention deficit hyperactivity disorder (ADHD), predominantly inattentive type 08/09/2021   Need for varicella vaccine 08/09/2021   Urge incontinence 08/01/2021   Tinea corporis 03/14/2021   Hyperlipidemia LDL goal <100 03/01/2021   DDD (degenerative disc disease), lumbar 08/08/2020   Non-seasonal allergic rhinitis due to pollen 07/25/2020   Chronic idiopathic constipation 07/25/2020   Visit for screening mammogram 02/11/2020   Chronic bilateral low back pain without sciatica 02/10/2020  Spondylosis without myelopathy or radiculopathy, lumbar region 12/04/2017   Type 2 diabetes mellitus with hyperglycemia (HCC)    Normal coronary arteries 04/17/2016   OSA on CPAP 10/28/2014   Chronic systolic CHF (congestive heart failure), NYHA class 3 (HCC) 12/06/2013   Hyperlipidemia associated with type 2 diabetes mellitus (HCC), on Zocor 04/04/2011   Hypertension associated with diabetes (HCC) 06/03/2008   Morbid obesity with BMI of 50.0-59.9, adult (HCC) 07/12/2006   NICM (nonischemic  cardiomyopathy) (HCC) 07/12/2006   Migraine without status migrainosus, not intractable 07/12/2006    PCP: [name], MD   REFERRING PROVIDER: Rodolph Bong, MD   REFERRING DIAG:   Chronic pain of right knee  M54.50,G89.29 (ICD-10-CM) - Chronic bilateral low back pain without sciatica    Rationale for Evaluation and Treatment: Rehabilitation  THERAPY DIAG:  Other low back pain  Muscle weakness (generalized)  Chronic pain of right knee  Chronic bilateral low back pain without sciatica  ONSET DATE: >5  SUBJECTIVE:                                                                                                                                                                                           SUBJECTIVE STATEMENT: Pt reports she had lumbar injection on Monday. "They told me to wait 3 days before exercise, so I almost cancelled today." "I was in a lot of pain after last land therapy." "When I told my husband I didn't want to use my scooter to go into PT today, he started clapping for me."    PERTINENT HISTORY:  [name] 03/19/22: 57 y.o. female with chronic low back pain and chronic right knee pain  . . . bothersome paresthesias sensation bilateral lower extremities.  . . . Right knee pain due to exacerbation of DJD.  . . . Chronic back pain thought to be due to core weakness and facet arthritis changes seen on CT scan.   PAIN:  Are you having pain? Yes: NPRS scale: 8/10 Pain location: LB to bilat hips, Rt shoulder, neck  Pain description: ache, pressure Aggravating factors: standing and moving, walking through home Relieving factors: sitting  PRECAUTIONS: Knee and Back  WEIGHT BEARING RESTRICTIONS: No  FALLS:  Has patient fallen in last 6 months? No  LIVING ENVIRONMENT: Lives with: lives with their family and lives with their spouse Lives in: House/apartment Stairs: Yes: Internal: 16 steps; on right going up Has following equipment at home: Dan Humphreys  - 4 wheeled and shower chair  OCCUPATION: disabled  PLOF: Needs assistance with ADLs, Needs assistance with homemaking, and Needs assistance with gait  PATIENT GOALS: increased  movement without pain, ADL's, cook, climb stairs, go to the store without hurting  NEXT MD VISIT: 6 weeks  OBJECTIVE:   DIAGNOSTIC FINDINGS:  X-ray images right knee: Mild medial compartment DJD.  Mild patellofemoral DJD  CT scan images of lumbar spine: Diffuse degenerative changes present throughout lumbar spine   PATIENT SURVEYS:  FOTO Primary score 22% with goal 39% 06/28/22: 22% 08/07/22: Lumbar: 16%, knee: 29% 7/17: Lumbar: 44%, knee: 40%  SCREENING FOR RED FLAGS: none  COGNITION: Overall cognitive status: Within functional limits for tasks assessed     SENSATION: wfl  POSTURE: increased thoracic kyphosis,right shoulder elevated  PALPATION: No tenderness although difficulty to assess due to body habitus  LUMBAR ROM:   AROM eval 06/28/22 07/19/22  Flexion Mid shin    Extension 0 10d wfl  Right lateral flexion 50% 75% full  Left lateral flexion 50% 75% full  Right rotation 100%    Left rotation 100%     (Blank rows = not tested)  LOWER EXTREMITY ROM:      Limited due to body habitus  LOWER EXTREMITY MMT:    MMT Right eval Left eval 06/28/22 Right / left 07/19/22 Right / Left 08/21/22 Right/left 7/17   Hip flexion 3+ 3+ 4-/5 35.6 / 41.7 42.3/42.2 50.6/49.2  Hip extension        Hip abduction    35.9 / 33/9 35.7/37.0 41.1/40.6  Hip adduction        Hip internal rotation        Hip external rotation        Knee flexion 4- 4- 4+ / 4+  24.7/34.0 36.2/44.2  Knee extension 4+ 4+ 5- / 5- 25.6 / 30.1 31.0/36.3 33.2/36.0  Ankle dorsiflexion        Ankle plantarflexion        Ankle inversion        Ankle eversion         (Blank rows = not tested)  LUMBAR SPECIAL TESTS:  Slump test: Negative  FUNCTIONAL TESTS:  5 times sit to stand: 24.16 seated on scooter Timed up and go (TUG):  20.90 from seated on scooter Berg: 35/56  06/21/22:  5x STS:  18.40 seated on scooter    TUG 12.91 from seated on scooter  06/28/22: Sharlene Motts: 40/56    07/19/22 TUG: 14.85      5 x STS: 17.11  7/3: Sharlene Motts 47/56  GAIT: Distance walked: 30 ft Assistive device utilized: None Level of assistance:  none Comments: shoulder retraction, antalgic gait pattern  TODAY'S TREATMENT:        09/11/22 FOTO HHD muscle testing Gait training without AD  Standing marching 2x10 Standing HR/TR x20  Gait 350ft Nu-step 4 min L4 Trial of recumbent bike for use at [facility] x32min  HEP update and review of exercises  09/06/22 Pt seen for aquatic therapy today.  Treatment took place in water 3.5-4.75 ft in depth at the [facility] pool. Temp of water was 91.  Pt entered/exited the pool via stairs in step-to pattern with hand rail.  * without support:  walking forward/ backward with reciprocal arm swing *side step with shoulder add/abd with yellow->rainbow hand floats at 4 ft; walking forward / backward with rainbow hand floats under water swinging at sides * wall push up/off x 10 * UE on yellow hand floats:  leg swings into hip flex/ext and hip abdct/ addct; heel raises; squats (with vertical trunk)  * forward marching and forward walking kicks * UE on blue  hand floats:  side to side pendulums x 4 each;  front to back pendulums x 4 with cues and demo for improved technique * return to walking forward/ backward    09/04/22  Gait traiing with SPC 1ft Without SPC x22ft Standing marching 2x10 Standing HR/TR x20  Step ups 6 inch step 1 x 10 bilateral with single rail support LAQ 2.5# x12 bil Seated lumbar stretching   08/30/22 Pt seen for aquatic therapy today.  Treatment took place in water 3.5-4.75 ft in depth at the [facility] pool. Temp of water was 91.  Pt entered/exited the pool via stairs with hand rail.  * without support:  walking forward/ backward with reciprocal arm  swing *side step with shoulder add/abd with yellow hand floats 3.6 ft * forward marching and forward walking kicks * UE on yellow hand floats:  leg swings into hip flex/ext and hip abdct/ addct; heel raises; squats (with vertical trunk)  * Lt forward step ups x 10 with BUE on rails * UE on blue hand floats:  knee tucks ->  side to side pendulums x 4 each;  front to back pendulums x 4 with cues and demo for improved technique * return to walking forward/ backward    Pt requires the buoyancy and hydrostatic pressure of water for support, and to offload joints by unweighting joint load by at least 50 % in navel deep water and by at least 75-80% in chest to neck deep water.  Viscosity of the water is needed for resistance of strengthening. Water current perturbations provides challenge to standing balance requiring increased core activation.     7/3:  BERG Balance Test          Date: 7/3  Sit to Stand 4  Standing unsupported 4  Sitting with back unsupported but feet supported 4  Stand to sit  3  Transfers  4  Standing unsupported with eyes closed 4  Standing unsupported feet together 4  From standing position, reach forward with outstretched arm 3 (9 inches)  From standing position, pick up object from floor 3  From standing position, turn and look behind over each shoulder 4  Turn 360 4  Standing unsupported, alternately place foot on step 3 (4inch step)  Standing unsupported, one foot in front 3  Standing on one leg 0  Total:  47/56     Standing marching 2x10 Standing hip abduction 2x10ea  Step ups 4 inch step 2 x 10 bilateral Gait to locker room at end of session, 1 seated rest break   6/28: Gait with/without transport wc- 330ft (full lap around clinic. 2 standing rest breaks towards end. Only used w/c initial 14ft, then stopped due to hand pain)  Seated HSS 30sec x3 R  Standing marching x20   Standing hip abduction 2x10ea    6/26: Gait with transport wc- 368ft  (full lap around clinic. Frequent standing breaks but no seated breaks.)  Updated muscle testing Standing L stretch at rail Standing marching x20  LAQ 2# x15ea Standing gastroc stretch- 2x30s R LE Standing hip abduction 2x10ea  6/21: * without support:  walking forward/ backward with reciprocal arm swing *side step with shoulder add/abd without and with yellow hand floats * forward marching and forward walking kicks * UE on yellow hand floats:  leg swings into hip flex/ext and hip abdct/ addct; heel raises; squats (with vertical trunk)  * Lt forward step ups x 10 with BUE on rails * UE on blue hand  floats:  knee tucks ->  side to side pendulums x 4 each;  front to back pendulums x 4 with cues and demo for improved technique * return to walking forward/ backward    PATIENT EDUCATION:  Education details: Aquatic therapy progressions/ modifications Person educated: Patient Education method: Explanation Education comprehension: verbalized understanding  HOME EXERCISE PROGRAM:  Access Code: [email or web address] URL: [email or web address] Date: 09/11/2022 Prepared by: Riki Altes   Aquatic Access Code: [email or web address] URL: [email or web address] Date: 09/06/2022 Prepared by: [facility] [address] This aquatic home exercise program from [employer] utilizes pictures from land based exercises, but has been adapted prior to lamination and issuance.    ASSESSMENT:  CLINICAL IMPRESSION: Pt had overall good tolerance for last therex session, though limited in abilities today due to recent injection. Spent time updated strength measurements and reviewing goals. She has made significant improvements in strength since last tested. Her FOTO scores increased to 44% (lumbar) and 40% (knee), meeting both FOTO goals. She has currently met 4/6 STG and 1/6 LTG. She was able to ambulate 350ft again without AD or scooter, but did use occasional support from wall. She  has [employer], so discussed with her potential exercises she can complete at the gym and appropriate starting intensities. Trialed nu-step in clinic which she hd excellent tolerance for. Pt also requested to try recumbent bike as she would like to incorporate this into exercise program at [facility]. This was more challenging due to increased hip flexion; however, she was able to complete full revolutions without significant increase in knee discomfort.      OBJECTIVE IMPAIRMENTS: decreased activity tolerance, decreased balance, decreased endurance, decreased knowledge of use of DME, decreased mobility, difficulty walking, decreased strength, impaired sensation, postural dysfunction, obesity, and pain.   ACTIVITY LIMITATIONS: carrying, lifting, bending, sitting, standing, squatting, stairs, transfers, bed mobility, locomotion level, and caring for others  PARTICIPATION LIMITATIONS: meal prep, cleaning, laundry, shopping, community activity, and occupation  PERSONAL FACTORS: Age, Behavior pattern, Fitness, Time since onset of injury/illness/exacerbation, and 1-2 comorbidities: cardiomyopathy, Oa knees and back, depression/anxiety  are also affecting patient's functional outcome.   REHAB POTENTIAL: Good  CLINICAL DECISION MAKING: Evolving/moderate complexity  EVALUATION COMPLEXITY: Moderate   GOALS: Goals reviewed with patient? Yes  SHORT TERM GOALS: Target date: 06/24/22  Pt will tolerate full aquatic sessions consistently without increase in pain and with improving function to demonstrate good toleration and effectiveness of intervention.   Baseline:TBA Goal status:MET - 06/14/22  2.  Pt will climb stairs using combination of step to and alternating pattern without pausing consistently Baseline: improving - some pauses, but not consistent - 06/19/22 Goal status:ONGOING (still pauses)  3.  Pt will report climbing stairs at home 1-2 x weekly to access bedroom and full bath more  regularly. Baseline: <1x weekly Pt reports she has been showering at therapy and has not been climbing stairs Goal status: ongoing 09/11/22  4.  Pt will tolerate walking 8-10 continuous minutes in pool to demonstrate improving toleration to activity Baseline: na Goal status: MET - 06/14/22  5.  Pt will improve on 5 X STS test to <or=   18s to demonstrate improving functional lower extremity strength, transitional movements, and balance Baseline: 24.16 at eval;  18.4s on 06/21/22 Goal status: MET 07/19/22 (17.11)  6.  Pt will improve on Tug test to <or= 14s to demonstrate improvement in lower extremity function, mobility and decreased fall risk.  Baseline:  20.90 at eval;  12.91 on 06/21/22 Goal status: MET  LONG TERM GOALS: Target date: 09/13/22  Pt to meet stated Foto Goal of 39% to demonstrate improved perception of ability Baseline: 22% Goal status: MET 7/17  2.  Pt will be indep with final HEP's (land and aquatic as appropriate) for continued management of condition  Baseline:  Goal status: in process 7/17  3.  Pt will walk in store for up to or >15 mins pushing cart Baseline: rides scooter Goal status: Ongoing 7/17- still uses scooter  4.  Pt will report decrease in pain with amb to <5/10 to demonstrate improvement management of condition Baseline: 9/10 Goal status: ongoing  7/17 (decreased to 7-8/10 )  5.  Pt will improve on Berg balance test to >/= 50/56 to demonstrate a decrease in fall risk.  Baseline: 35/56 Goal status: in progress 08/28/22 (47/56)  6.  Pt will increase strength of hip by 1 full grade to improve functional mobility. Baseline: see chart Goal status: deferred (changes measurement tool) 5/24/247/17  7.Pt will improve strength in all areas listed by 10lb to demonstrate improved overall physical function  Baseline: see chart  Goal Status: In PROGRESS (met for knees, but not for hips) 7/17   PLAN:  PT FREQUENCY: 1-2x/week  PT DURATION: 8 weeks  alternating settings land and aquatics  PLANNED INTERVENTIONS: Therapeutic exercises, Therapeutic activity, Neuromuscular re-education, Balance training, Gait training, Patient/Family education, Self Care, Joint mobilization, Stair training, DME instructions, Aquatic Therapy, Dry Needling, Cryotherapy, Moist heat, Taping, Ultrasound, Biofeedback, Ionotophoresis 4mg /ml Dexamethasone, Manual therapy, and Re-evaluation.  PLAN FOR NEXT SESSION: finalize land HEP (in preparation for potential d/c).  Assess goals as able.    Riki Altes, PTA  09/12/22 10:04 AM [facility] [address] [place], [place], [address] Phone: [phone]   Fax:  [phone]

## 2022-09-13 ENCOUNTER — Encounter (HOSPITAL_BASED_OUTPATIENT_CLINIC_OR_DEPARTMENT_OTHER): Payer: Self-pay | Admitting: Physical Therapy

## 2022-09-13 ENCOUNTER — Ambulatory Visit (HOSPITAL_BASED_OUTPATIENT_CLINIC_OR_DEPARTMENT_OTHER): Payer: Medicare Other | Admitting: Physical Therapy

## 2022-09-13 DIAGNOSIS — M25561 Pain in right knee: Secondary | ICD-10-CM | POA: Diagnosis not present

## 2022-09-13 DIAGNOSIS — M545 Low back pain, unspecified: Secondary | ICD-10-CM | POA: Diagnosis not present

## 2022-09-13 DIAGNOSIS — M6281 Muscle weakness (generalized): Secondary | ICD-10-CM | POA: Diagnosis not present

## 2022-09-13 DIAGNOSIS — G8929 Other chronic pain: Secondary | ICD-10-CM | POA: Diagnosis not present

## 2022-09-13 DIAGNOSIS — M5459 Other low back pain: Secondary | ICD-10-CM

## 2022-09-13 NOTE — Therapy (Unsigned)
OUTPATIENT PHYSICAL THERAPY THORACOLUMBAR TREATMENT   Patient Name: [name] MRN: [id number] DOB:06/21/1965, 57 y.o., female Today's Date: 09/15/2022  END OF SESSION:  PT End of Session -     Visit Number 27   Number of Visits 32   Date for PT Re-Evaluation 11/08/22   Authorization Type    PT Start Time     PT Stop Time    PT Time Calculation (min)    Activity Tolerance Tol well   Behavior During Therapy  wfl        Past Medical History:  Diagnosis Date   ADD (attention deficit disorder)    Anxiety    Arthritis    both knees right worse than left   Carpal tunnel syndrome of right wrist    Depression    Essential hypertension, benign    Gallstones    History of smoking 06/22/2016   Hyperlipidemia associated with type 2 diabetes mellitus (HCC), on Zocor 04/04/2011   Hypertension associated with diabetes (HCC) 06/03/2008   Migraines    Morbid obesity (HCC)    Morbid obesity with BMI of 50.0-59.9, adult (HCC) 07/12/2006   NICM (nonischemic cardiomyopathy) (HCC) 07/12/2006   01/16/18 ECHO:    - Procedure narrative: Transthoracic echocardiography. Image   quality was suboptimal. The study was technically difficult.   Intravenous contrast (Definity) was administered. - Left ventricle: The cavity size was moderately dilated. Wall   thickness was increased in a pattern of mild LVH. Systolic   function was moderately to severely reduced. The estimated   ejection fraction w   Normal coronary arteries 04/17/2016   OSA on CPAP 10/28/2014   Spinal stenosis    back pain   Spondylosis without myelopathy or radiculopathy, lumbar region 12/04/2017   Type 2 diabetes mellitus with hyperglycemia [facility])    Past Surgical History:  Procedure Laterality Date   CHOLECYSTECTOMY     DILATATION & CURETTAGE/HYSTEROSCOPY WITH MYOSURE N/A 10/31/2017   Procedure: DILATATION & CURETTAGE/HYSTEROSCOPY WITH MYOSURE;  Surgeon: Romualdo Bolk, MD;  Location: [facility];  Service: Gynecology;  Laterality:  N/A;   HYSTEROSCOPY WITH D & C N/A 05/07/2022   Procedure: DILATATION AND CURETTAGE /HYSTEROSCOPY;  Surgeon: [name], MD;  Location: [facility] OR;  Service: Gynecology;  Laterality: N/A;   RIGHT/LEFT HEART CATH AND CORONARY ANGIOGRAPHY N/A 04/11/2016   Procedure: Right/Left Heart Cath and Coronary Angiography;  Surgeon: Kathleene Hazel, MD;  Location: [facility] INVASIVE CV LAB;  Service: Cardiovascular;  Laterality: N/A;   sonogram for blood clots     no blockages   Patient Active Problem List   Diagnosis Date Noted   Rosacea, acne 08/13/2022   BRBPR (bright red blood per rectum) 08/13/2022   Primary osteoarthritis of both knees 03/06/2022   Bilateral ovarian cysts 03/06/2022   Major depressive disorder, recurrent episode, severe with anxious distress (HCC) 08/09/2021   Attention deficit hyperactivity disorder (ADHD), predominantly inattentive type 08/09/2021   Need for varicella vaccine 08/09/2021   Urge incontinence 08/01/2021   Tinea corporis 03/14/2021   Hyperlipidemia LDL goal <100 03/01/2021   DDD (degenerative disc disease), lumbar 08/08/2020   Non-seasonal allergic rhinitis due to pollen 07/25/2020   Chronic idiopathic constipation 07/25/2020   Visit for screening mammogram 02/11/2020   Chronic bilateral low back pain without sciatica 02/10/2020   Spondylosis without myelopathy or radiculopathy, lumbar region 12/04/2017   Type 2 diabetes mellitus with hyperglycemia (HCC)    Normal coronary arteries 04/17/2016   OSA on CPAP 10/28/2014  Chronic systolic CHF (congestive heart failure), NYHA class 3 (HCC) 12/06/2013   Hyperlipidemia associated with type 2 diabetes mellitus (HCC), on Zocor 04/04/2011   Hypertension associated with diabetes (HCC) 06/03/2008   Morbid obesity with BMI of 50.0-59.9, adult (HCC) 07/12/2006   NICM (nonischemic cardiomyopathy) (HCC) 07/12/2006   Migraine without status migrainosus, not intractable 07/12/2006    PCP: [name], MD    REFERRING PROVIDER: Rodolph Bong, MD   REFERRING DIAG:   Chronic pain of right knee  M54.50,G89.29 (ICD-10-CM) - Chronic bilateral low back pain without sciatica    Rationale for Evaluation and Treatment: Rehabilitation  THERAPY DIAG:  Other low back pain  Muscle weakness (generalized)  Chronic pain of right knee  Chronic bilateral low back pain without sciatica  ONSET DATE: >5  SUBJECTIVE:                                                                                                                                                                                           SUBJECTIVE STATEMENT: Pt reports she has climbed stairs at home 2 x this week. She does report slight increase in pain after last land visit maybe secondary to initiating use of recumbent bike. She vu that she may have some intermittent increase in discomfort with the addition of new activity.     PERTINENT HISTORY:  [name] 03/19/22: 57 y.o. female with chronic low back pain and chronic right knee pain  . . . bothersome paresthesias sensation bilateral lower extremities.  . . . Right knee pain due to exacerbation of DJD.  . . . Chronic back pain thought to be due to core weakness and facet arthritis changes seen on CT scan.   PAIN:  Are you having pain? Yes: NPRS scale: 7-8/10 Pain location: LB to bilat hips, Rt shoulder, neck  Pain description: ache, pressure Aggravating factors: standing and moving, walking through home Relieving factors: sitting  PRECAUTIONS: Knee and Back  WEIGHT BEARING RESTRICTIONS: No  FALLS:  Has patient fallen in last 6 months? No  LIVING ENVIRONMENT: Lives with: lives with their family and lives with their spouse Lives in: House/apartment Stairs: Yes: Internal: 16 steps; on right going up Has following equipment at home: Walker - 4 wheeled and shower chair  OCCUPATION: disabled  PLOF: Needs assistance with ADLs, Needs assistance with homemaking, and Needs  assistance with gait  PATIENT GOALS: increased movement without pain, ADL's, cook, climb stairs, go to the store without hurting  NEXT MD VISIT: 6 weeks  OBJECTIVE:   DIAGNOSTIC FINDINGS:  X-ray images right knee: Mild medial compartment DJD.  Mild patellofemoral DJD  CT scan images of lumbar spine: Diffuse degenerative changes present throughout lumbar spine   PATIENT SURVEYS:  FOTO Primary score 22% with goal 39% 06/28/22: 22% 08/07/22: Lumbar: 16%, knee: 29% 7/17: Lumbar: 44%, knee: 40%  SCREENING FOR RED FLAGS: none  COGNITION: Overall cognitive status: Within functional limits for tasks assessed     SENSATION: wfl  POSTURE: increased thoracic kyphosis,right shoulder elevated  PALPATION: No tenderness although difficulty to assess due to body habitus  LUMBAR ROM:   AROM eval 06/28/22 07/19/22 09/13/22  Flexion Mid shin   full  Extension 0 10d wfl   Right lateral flexion 50% 75% full   Left lateral flexion 50% 75% full   Right rotation 100%     Left rotation 100%      (Blank rows = not tested)  LOWER EXTREMITY ROM:      Limited due to body habitus  LOWER EXTREMITY MMT:    MMT Right eval Left eval 06/28/22 Right / left 07/19/22 Right / Left 08/21/22 Right/left 7/17 Right / Left   Hip flexion 3+ 3+ 4-/5 35.6 / 41.7 42.3/42.2 50.6/49.2  Hip extension        Hip abduction    35.9 / 33/9 35.7/37.0 41.1/40.6  Hip adduction        Hip internal rotation        Hip external rotation        Knee flexion 4- 4- 4+ / 4+  24.7/34.0 36.2/44.2  Knee extension 4+ 4+ 5- / 5- 25.6 / 30.1 31.0/36.3 33.2/36.0  Ankle dorsiflexion        Ankle plantarflexion        Ankle inversion        Ankle eversion         (Blank rows = not tested)  LUMBAR SPECIAL TESTS:  Slump test: Negative  FUNCTIONAL TESTS:  5 times sit to stand: 24.16 seated on scooter Timed up and go (TUG): 20.90 from seated on scooter Berg: 35/56  06/21/22:  5x STS:  18.40 seated on scooter    TUG 12.91  from seated on scooter  06/28/22: Berg: 40/56    07/19/22 TUG: 14.85      5 x STS: 17.11  7/3: Berg 47/56 7/19: TUG 15.67           5 x STS: 16.45  GAIT: 09/13/22 Distance walked: 500 ft Assistive device utilized: None Level of assistance:  none Comments: increased sway R/L, becomes more exaggerated with fatigue  TODAY'S TREATMENT:        09/13/22 Re-assess TUG 5 x STS  Self care: Pt instruction on completion of HEP; importance and benefits.  Chronic condition which requires daily exercises/stretching for proper management. Pain management; mental wellness  09/11/22 FOTO HHD muscle testing Gait training without AD  Standing marching 2x10 Standing HR/TR x20  Gait 36ft Nu-step 4 min L4 Trial of recumbent bike for use at [facility] x1min  HEP update and review of exercises  09/06/22  Pt seen for aquatic therapy today.  Treatment took place in water 3.5-4.75 ft in depth at the [facility] pool. Temp of water was 91.  Pt entered/exited the pool via stairs in step-to pattern with hand rail.  * without support:  walking forward/ backward with reciprocal arm swing *side step with shoulder add/abd with yellow->rainbow hand floats at 4 ft; walking forward / backward with rainbow hand floats under water swinging at sides * wall push up/off x 10 * UE on yellow hand floats:  leg  swings into hip flex/ext and hip abdct/ addct; heel raises; squats (with vertical trunk)  * forward marching and forward walking kicks * UE on blue hand floats:  side to side pendulums x 4 each;  front to back pendulums x 4 with cues and demo for improved technique * return to walking forward/ backward     PATIENT EDUCATION:  Education details: Aquatic therapy progressions/ modifications Person educated: Patient Education method: Explanation Education comprehension: verbalized understanding  HOME EXERCISE PROGRAM:  Access Code: [email or web address] URL: [email or web address] Date:  09/11/2022 Prepared by: Riki Altes   Aquatic Access Code: [email or web address] URL: [email or web address] Date: 09/06/2022 Prepared by: [facility] This aquatic home exercise program from MedBridge utilizes pictures from land based exercises, but has been adapted prior to lamination and issuance.    ASSESSMENT:  CLINICAL IMPRESSION: PN:Pt has made good progress throughout in all areas as noted in above charts which demonstrates improvement in functional lower extremity strength, transitional movements, and balance. Of note her strength has improved significantly. Her FOTO scores increased to 44% (lumbar) and 40% (knee), meeting both FOTO goals. She has currently met 4/6 STG and 1/6 LTG. She continues to have deficit in stair climbing negotiation (to full bath in home), toleration to amb as well as high pain sensitivity.  She does has membership to [employer] with acces to exercise machines and pool.  She will continue to benefit from skilled physical therapy 1 x every other week to advance HEP monitoring progress and reach remaining goals.     OBJECTIVE IMPAIRMENTS: decreased activity tolerance, decreased balance, decreased endurance, decreased knowledge of use of DME, decreased mobility, difficulty walking, decreased strength, impaired sensation, postural dysfunction, obesity, and pain.   ACTIVITY LIMITATIONS: carrying, lifting, bending, sitting, standing, squatting, stairs, transfers, bed mobility, locomotion level, and caring for others  PARTICIPATION LIMITATIONS: meal prep, cleaning, laundry, shopping, community activity, and occupation  PERSONAL FACTORS: Age, Behavior pattern, Fitness, Time since onset of injury/illness/exacerbation, and 1-2 comorbidities: cardiomyopathy, Oa knees and back, depression/anxiety  are also affecting patient's functional outcome.   REHAB POTENTIAL: Good  CLINICAL DECISION MAKING: Evolving/moderate complexity  EVALUATION  COMPLEXITY: Moderate   GOALS: Goals reviewed with patient? Yes  SHORT TERM GOALS: Target date: 06/24/22  Pt will tolerate full aquatic sessions consistently without increase in pain and with improving function to demonstrate good toleration and effectiveness of intervention.   Baseline:TBA Goal status:MET - 06/14/22  2.  Pt will climb stairs using combination of step to and alternating pattern without pausing consistently Baseline: improving - some pauses, but not consistent - 06/19/22 Goal status:ONGOING (still pauses)  3.  Pt will report climbing stairs at home 1-2 x weekly to access bedroom and full bath more regularly. Baseline: <1x weekly Pt reports she has been showering at therapy and has not been climbing stairs Goal status: ongoing 09/11/22; Met 09/13/22  4.  Pt will tolerate walking 8-10 continuous minutes in pool to demonstrate improving toleration to activity Baseline: na Goal status: MET - 06/14/22  5.  Pt will improve on 5 X STS test to <or=   18s to demonstrate improving functional lower extremity strength, transitional movements, and balance Baseline: 24.16 at eval;  18.4s on 06/21/22 Goal status: MET 07/19/22 (17.11)  6.  Pt will improve on Tug test to <or= 14s to demonstrate improvement in lower extremity function, mobility and decreased fall risk.  Baseline:  20.90 at eval;  12.91 on 06/21/22 Goal status: MET   LONG TERM GOALS: Target date:  09/13/22  Pt to meet stated Foto Goal of 39% to demonstrate improved perception of ability Baseline: 22% Goal status: MET 7/17  2.  Pt will be indep with final HEP's (land and aquatic as appropriate) for continued management of condition  Baseline:  Goal status: in process 7/17  3.  Pt will walk in store for up to or >15 mins pushing cart Baseline: rides scooter Goal status: Ongoing 7/17- still uses scooter  4.  Pt will report decrease in pain with amb to <5/10 to demonstrate improvement management of condition Baseline:  9/10 Goal status: ongoing  7/17 (decreased to 7-8/10 )  5.  Pt will improve on Berg balance test to >/= 50/56 to demonstrate a decrease in fall risk.  Baseline: 35/56 Goal status: in progress 08/28/22 (47/56)  6.  Pt will increase strength of hip by 1 full grade to improve functional mobility. Baseline: see chart Goal status: deferred (changes measurement tool) 5/24/247/17  7.Pt will improve strength in all areas listed by 10lb to demonstrate improved overall physical function  Baseline: see chart  Goal Status: In PROGRESS (met for knees, but not for hips) 7/17   PLAN:  PT FREQUENCY: 1x every other week x4 for land; 1 more pool session for indep with final HEP  PT DURATION: 8 weeks  PLANNED INTERVENTIONS: Therapeutic exercises, Therapeutic activity, Neuromuscular re-education, Balance training, Gait training, Patient/Family education, Self Care, Joint mobilization, Stair training, DME instructions, Aquatic Therapy, Dry Needling, Cryotherapy, Moist heat, Taping, Ultrasound, Biofeedback, Ionotophoresis 4mg /ml Dexamethasone, Manual therapy, and Re-evaluation.  PLAN FOR NEXT SESSION: finalize land HEP, Stair climbing, amb toleration/aerobic capacity training.  Aquatic x 1 session final HEP instruction   Rushie Chestnut) Taseen Marasigan MPT 09/15/22 12:55 PM [facility] [address] [place], [place], [address] Phone: [phone]   Fax:  [phone]

## 2022-09-16 ENCOUNTER — Ambulatory Visit: Payer: Medicare Other | Admitting: Obstetrics and Gynecology

## 2022-09-17 ENCOUNTER — Ambulatory Visit: Payer: Medicare Other | Admitting: Licensed Clinical Social Worker

## 2022-09-17 DIAGNOSIS — I152 Hypertension secondary to endocrine disorders: Secondary | ICD-10-CM

## 2022-09-17 DIAGNOSIS — I5022 Chronic systolic (congestive) heart failure: Secondary | ICD-10-CM

## 2022-09-17 DIAGNOSIS — F900 Attention-deficit hyperactivity disorder, predominantly inattentive type: Secondary | ICD-10-CM

## 2022-09-17 NOTE — Patient Outreach (Signed)
  Care Coordination  Follow Up Visit Note   09/17/2022 Name: Tonimarie Gritz MRN: [id number] DOB: 04-11-1965  Odessia Asleson is a 57 y.o. year old female who sees [name], MD for primary care. I spoke with  [name] by phone today.  What matters to the patients health and wellness today?    Patient is making great progress with goals set for managing her health needs.  She is concerned today with Drugg interaction /side effects.   Goals Addressed             This Visit's Progress    Care Coordination Activities for mental & physical health support       Activities in order to accomplish goals.   Congratulations on making progress with physical therapy  Keep your appointment scheduled with [facility] [phone]  for medication management and therapy Genia Del) Continue with compliance of taking medication prescribed by Doctor and keep all medical appointments Keep medical appointment we reviewed today for physical therapy, Per your request I have placed a referral with pharmacy team for your concern related to Drugg interaction /side effects  You have decided to put this on hold to talk with [name] at the [employer] [facility]  [phone] for Medicare options.  At this time [name] is the only one that has all of your providers in network this includes $320 for flex dollars for Benefit card; , dental ,  0 doctor visit, 0 for therapy ; Silver Sneakers , foot care &   purse devise with life station ( "fallen and can't get up").        SDOH assessments and interventions completed:  No   Care Coordination Interventions:  Yes, provided  Interventions Today    Flowsheet Row Most Recent Value  Chronic Disease   Chronic disease during today's visit Diabetes, Congestive Heart Failure (CHF), Hypertension (HTN)  General Interventions   General Interventions Discussed/Reviewed General Interventions Reviewed  Education Interventions   Education Provided Provided  Education  Provided Verbal Education On Mental Health/Coping with Illness  Mental Health Interventions   Mental Health Discussed/Reviewed Mental Health Reviewed, Anxiety, Coping Strategies  [solution focused, active listening, problem solving]  Pharmacy Interventions   Pharmacy Dicussed/Reviewed Pharmacy Topics Discussed, Referral to Pharmacist  Referral to Pharmacist Drug interaction/side effects       Follow up plan:  Referral made to Pharmacy for education and Drugg interaction ( Addereal XR)  Follow up call scheduled for 10/01/22 with LCSW    Encounter Outcome:  Pt. Visit Completed   [name], LCSW Social Work Care Coordination  [facility] [employer] [phone]

## 2022-09-17 NOTE — Patient Instructions (Signed)
Social Work Visit Information  Thank you for taking time to visit with me today. Please don't hesitate to contact me if I can be of assistance to you.   Following are the goals we discussed today:   Goals Addressed             This Visit's Progress    Care Coordination Activities for mental & physical health support       Activities in order to accomplish goals.   Congratulations on making progress with physical therapy  Keep your appointment scheduled with [facility] [phone]  for medication management and therapy Genia Del) Continue with compliance of taking medication prescribed by Doctor and keep all medical appointments Keep medical appointment we reviewed today for physical therapy, Per your request I have placed a referral with pharmacy team for your concern related to Drugg interaction /side effects  You have decided to put this on hold to talk with [name] at the [employer] [facility]  [phone] for Medicare options.  At this time [name] is the only one that has all of your providers in network this includes $320 for flex dollars for Benefit card; , dental ,  0 doctor visit, 0 for therapy ; Silver Sneakers , foot care &   purse devise with life station ( "fallen and can't get up").         Our next appointment is by telephone on 10/01/22 at 3:15   Please call the care guide team at [phone] if you need to cancel or reschedule your appointment.   If you or anyone you know are experiencing a Mental Health or Behavioral Health Crisis or need someone to talk to, please call the Suicide and Crisis Lifeline: 988 call the [place] National Suicide Prevention Lifeline: 251-586-2544 or TTY: 6845830435 TTY 367-234-8769) to talk to a trained counselor call 1-800-273-TALK (toll free, 24 hour hotline) go to [facility] [address] [place] [phone])   Patient verbalizes understanding of instructions and care plan  provided today and agrees to view in MyChart. Active MyChart status and patient understanding of how to access instructions and care plan via MyChart confirmed with patient.       [name], LCSW Social Work Care Coordination  [facility] [employer] [phone]

## 2022-09-18 ENCOUNTER — Other Ambulatory Visit: Payer: Medicare Other

## 2022-09-18 NOTE — Progress Notes (Signed)
   09/18/2022  Patient ID: [name], female   DOB: [birth year], 57 y.o.   MRN: [id number]  S/O Telephone visit to discuss recently added medication and benefit versus risk in regard to other medications and diagnoses  Medication Consultation -Patient was previously prescribed adderall for ADHD and MDD, and it appears this was last taken around 2015 -She endorses seeing benefit for several years but stopped medication due to diagnosed heart conditions (cardiomyopathy, CHF) and decreased benefit over time -Patient recently discussed retrial of medication with cardiologist due to increased ADHD and depression symptoms.  Cardiologist agreed that benefit of this medication could be worth the risk, per patient. -She was seen by NP specializing in psychiatry earlier this month whom prescribed Adderall XR 30mg  daily  -Patient and retail pharmacist expressed concern with XR formulation and strength since patient has not taken in quite some  -Patient attempted to contact provider to discuss but was never successful in getting a response -Started taking adderall XR 30mg  daily 7/16- states she has not noticed any difference in symptoms since starting the medication; but she does endorse increased anxiety and racing thoughts she believes to be caused from frustration with provider and situation -She has a follow-up scheduled with the provider 8/15  A/P  Medication Consultation -Discussed risk verus benefit of medication related to her other medication and current disease states -I informed patient that by this point, she should be seeing effects of the medication; but she could continue use until her follow-up appointment 8/15 unless she begins to have negative effects from medication -Educated on other medication options she could discuss with provider at next appt if she is still not experience benefit from adderall xr 30mg  -Recommend checking BP and HR daily, as this medication can increase both, which  would be contraindicated with her current heart conditions  Lenna Gilford, PharmD, DPLA

## 2022-09-25 ENCOUNTER — Other Ambulatory Visit (HOSPITAL_COMMUNITY): Payer: Self-pay

## 2022-09-25 ENCOUNTER — Ambulatory Visit (HOSPITAL_BASED_OUTPATIENT_CLINIC_OR_DEPARTMENT_OTHER): Payer: Medicare Other

## 2022-09-25 ENCOUNTER — Ambulatory Visit (HOSPITAL_COMMUNITY)
Admission: EM | Admit: 2022-09-25 | Discharge: 2022-09-25 | Disposition: A | Discharge status: Home / Self Care | Payer: Medicare Other | Attending: Psychiatry | Admitting: Psychiatry

## 2022-09-25 ENCOUNTER — Telehealth (HOSPITAL_COMMUNITY): Payer: Self-pay

## 2022-09-25 DIAGNOSIS — F411 Generalized anxiety disorder: Secondary | ICD-10-CM | POA: Insufficient documentation

## 2022-09-25 DIAGNOSIS — Z79899 Other long term (current) drug therapy: Secondary | ICD-10-CM | POA: Diagnosis not present

## 2022-09-25 DIAGNOSIS — F339 Major depressive disorder, recurrent, unspecified: Secondary | ICD-10-CM | POA: Insufficient documentation

## 2022-09-25 NOTE — Progress Notes (Signed)
   09/25/22 1928  [facility] Triage Screening (Walk-ins at [facility] only)  How Did You Hear About [place]? Family/Friend  What Is the Reason for Your Visit/Call Today? Pt presents to [facility] voluntarily, accompanied by relative with complaint of anxiety, stress and medication problem. Pt reports experiencing feelings of jumping out of her body, stress related body aches and difficulty focusing. Pt is linked to outpatient therapy services Judge Stall) and Psychiatric NP Merian Capron) and was prescribed Adderall ER 30mg  about 2 weeks ago. Pt believes medication is not working. Pt currently denies SI,HI, AVH and substance/alcohol use.  How Long Has This Been Causing You Problems? 1 wk - 1 month  Have You Recently Had Any Thoughts About Hurting Yourself? No  Are You Planning to Commit Suicide/Harm Yourself At This time? No  Have you Recently Had Thoughts About Hurting Someone Karolee Ohs? No  Are You Planning To Harm Someone At This Time? No  Are you currently experiencing any auditory, visual or other hallucinations? No  Have You Used Any Alcohol or Drugs in the Past 24 Hours? No  Do you have any current medical co-morbidities that require immediate attention? No (Blood pressure is elevated)  Clinician description of patient physical appearance/behavior: Pt is talkative, casually dressed  What Do You Feel Would Help You the Most Today? Treatment for Depression or other mood problem;Medication(s)  If access to [facility] was not available, would you have sought care in the Emergency Department? No  Determination of Need Routine (7 days)  Options For Referral Other: Comment;Outpatient Therapy;Medication Management

## 2022-09-25 NOTE — Discharge Instructions (Signed)
F/u with walk-in psychiatry  

## 2022-09-25 NOTE — ED Provider Notes (Signed)
Behavioral Health Urgent Care Medical Screening Exam  Patient Name: [name] MRN: [id number] Date of Evaluation: 09/25/22 Chief Complaint:   Diagnosis:  Final diagnoses:  Recurrent major depressive disorder, remission status unspecified (HCC)  Generalized anxiety disorder  Medication course changed    History of Present illness: [name] is a 57 y.o. female. With a history of general anxiety disorder, major depressive disorder, ADD currently on Adderall.  Presented to [facility] voluntarily.  Per the patient she was told that if she gets here she we would start her on an antidepressant. Patient is currently seeing a psychiatrist for med management however according to the patient they are not doing anything for her.  According to the patient that she was started on Adderall 30 mg extended release but she has spoken to a pharmacist and 2 nurses  who told her that the 30 mg XR was not the right dose that she is supposed to be on.   According to the patient she was taking Adderall for a while,  however the provider who was prescribing the medication at the time was too busy and she feel like they just wanted her money.   According to the patient she has multiple psychiatrist who has done nothing for her so she didn't go back to their office.  According to patient she lives at home with her husband, she is unemployed because she is on disability.  According to the patient she has been depressed since she was in her 42s.  Per the patient she is desperate to find a great psychiatrist one that will take the extra time to listen to her and get her on an antidepressant.  According to patient she was prescribed Adderall on 09/10/2022 by the new provider whoever  she does not know if that  psychiatrist is qualified because she does not think that she is supposed to be on XR.   Face-to-face observation of patient, patient is alert and oriented x 4, speech is clear, maintaining eye contact.  Patient  observed sitting in the room very anxious patient can be very talkative at times.  Patient is adamant that she needs [place] to start her on the antidepressant.  According to the patient she had called and spoke to a nurse over the phone and the nurse told her she needed to come into the clinic and she will be prescribed an antidepressant.  It is unsure who she spoke with.  Writer discussed with patient the walk-in psychiatry and hours. And the need for her to have a conversation with her current provider.  Patient denies SI, HI, AVH or paranoia.  Patient denies alcohol use.  Patient denies illicit drug use or smoking. Pt does not appear to be influence by extermal or internal stimuli,  however patient appear to be medication seeking and is fix on the idea that she need to start anti-depressant medication.  Pt was give resources and information to walk-in psychiatry.  Pt was advice to call 911 should she experience any SI,  Hi or hallucination.  Upon completion of this assessment,  pt was both medically and mentally stable for discharge.    Recommend discharge for pt to f/I with resources provided   Flowsheet Row ED from 09/25/2022 in [facility] Pre-Admission Testing 60 from 05/02/2022 in [facility] PREADMISSION TESTING ED to Hosp-Admission (Discharged) from 03/29/2022 in [facility] 1S Maternity Assessment Unit  C-SSRS RISK CATEGORY No Risk No Risk No Risk  Psychiatric Specialty Exam  Presentation  General Appearance:Casual  Eye Contact:Good  Speech:Clear and Coherent  Speech Volume:Normal  Handedness:Right   Mood and Affect  Mood: Anxious; Irritable  Affect: Appropriate   Thought Process  Thought Processes: Coherent  Descriptions of Associations:Circumstantial  Orientation:Full (Time, Place and Person)  Thought Content:WDL  Diagnosis of Schizophrenia or Schizoaffective disorder in past: No data recorded  Hallucinations:None  Ideas of  Reference:None  Suicidal Thoughts:No  Homicidal Thoughts:No   Sensorium  Memory: Immediate Fair  Judgment: Fair  Insight: Fair   Art therapist  Concentration: Fair  Attention Span: Fair  Recall: [employer] of Knowledge: Fair  Language: Fair   Psychomotor Activity  Psychomotor Activity: Normal   Assets  Assets: Desire for Improvement   Sleep  Sleep: Fair  Number of hours:  5   Physical Exam: Physical Exam HENT:     Head: Normocephalic.     Nose: Nose normal.  Cardiovascular:     Rate and Rhythm: Tachycardia present.  Musculoskeletal:        General: Normal range of motion.     Cervical back: Normal range of motion.  Neurological:     General: No focal deficit present.     Mental Status: She is alert.  Psychiatric:        Mood and Affect: Mood normal.        Thought Content: Thought content normal.    Review of Systems  Constitutional: Negative.   HENT: Negative.    Eyes: Negative.   Respiratory: Negative.    Cardiovascular: Negative.   Gastrointestinal: Negative.   Genitourinary: Negative.   Musculoskeletal: Negative.   Skin: Negative.   Neurological: Negative.   Psychiatric/Behavioral:  The patient is nervous/anxious.    Blood pressure (!) 169/95, pulse (!) 110, temperature 98.9 F (37.2 C), temperature source Oral, resp. rate 20, SpO2 99%. There is no height or weight on file to calculate BMI.  Musculoskeletal: Strength & Muscle Tone: within normal limits Gait & Station: normal Patient leans: N/A   [facility] MSE Discharge Disposition for Follow up and Recommendations: Based on my evaluation the patient does not appear to have an emergency medical condition and can be discharged with resources and follow up care in outpatient services for Medication Management and Individual Therapy   Sindy Guadeloupe, NP 09/25/2022, 11:38 PM

## 2022-09-25 NOTE — Telephone Encounter (Signed)
Patient called me today very tearful and anxious. Patient used to see Dr. Jama Flavors and has been unable to connect with another provider. Patient talked about "being done with it all" and said that she had contacted the suicide hotline. She was able to contract for safety, letting me know she had no plan and would not do that to her son. I advised patient to go to [facility] - she said she had called and was unable to talk to anyone, I advised her that she needs to walk in. She is scared she will go inpatient but also understands that may be what she needs. After much discussion patient agreed to walk in. She is interested in either PHP or IOP rather than inpatient. She also has a genesight test that was done - it is in the chart

## 2022-09-26 ENCOUNTER — Ambulatory Visit: Payer: Self-pay | Admitting: Licensed Clinical Social Worker

## 2022-09-26 NOTE — Patient Instructions (Signed)
Social Work Visit Information  Thank you for taking time to visit with me today. Please don't hesitate to contact me if I can be of assistance to you.   Following are the goals we discussed today:   Goals Addressed             This Visit's Progress    Care Coordination Activities for mental & physical health support       Activities in order to accomplish goals.   Congratulations on making progress with physical therapy  Keep your appointment scheduled with [facility] [phone]  for medication management 10/10/22 Call [facility] to see if they are able to get you in sooner  Continue with compliance of taking medication prescribed by Doctor and keep all medical appointments  You have decided to put this on hold to talk with [name] at the [employer]  [phone] for Medicare options.  At this time [name] is the only one that has all of your providers in network this includes $320 for flex dollars for Benefit card; , dental ,  0 doctor visit, 0 for therapy ; Silver Sneakers , foot care &   purse devise with life station ( "fallen and can't get up").         Our next appointment is by telephone on 10/01/22    Please call the care guide team at [phone] if you need to cancel or reschedule your appointment.   If you or anyone you know are experiencing a Mental Health or Behavioral Health Crisis or need someone to talk to, please call the Suicide and Crisis Lifeline: 988 call the [place] National Suicide Prevention Lifeline: 434-269-6207 or TTY: (818)105-2575 TTY 4171481988) to talk to a trained counselor call 1-800-273-TALK (toll free, 24 hour hotline) go to [facility] [address] [place] [phone])   Patient verbalizes understanding of instructions and care plan provided today and agrees to view in MyChart. Active MyChart status and patient understanding of how to access instructions and care plan via  MyChart confirmed with patient.       [name], LCSW Social Work Care Coordination  [facility] [employer] [phone]

## 2022-09-26 NOTE — Patient Outreach (Signed)
  Care Coordination  Follow Up Visit Note   09/26/2022 Name: [name] MRN: [id number] DOB: [birth year]  [name] is a 57 y.o. year old female who sees [name], MD for primary care. I spoke with  [name] by phone today.  What matters to the patients health and wellness today?    Experience difficulty with managing her mental health.  She called [phone] this week and went to Behavior Health Outpatient.  Denies SI at this time.   Goals Addressed             This Visit's Progress    Care Coordination Activities for mental & physical health support       Activities in order to accomplish goals.   Congratulations on making progress with physical therapy  Keep your appointment scheduled with [facility] [phone]  for medication management 10/10/22 Call [facility] to see if they are able to get you in sooner  Continue with compliance of taking medication prescribed by Doctor and keep all medical appointments  You have decided to put this on hold to talk with [name] at the [employer]  [phone] for Medicare options.  At this time [name] is the only one that has all of your providers in network this includes $320 for flex dollars for Benefit card; , dental ,  0 doctor visit, 0 for therapy ; Silver Sneakers , foot care &   purse devise with life station ( "fallen and can't get up").        SDOH assessments and interventions completed:  No   Care Coordination Interventions:  Yes, provided  Interventions Today    Flowsheet Row Most Recent Value  Chronic Disease   Chronic disease during today's visit Hypertension (HTN), Diabetes  General Interventions   General Interventions Discussed/Reviewed General Interventions Reviewed  Education Interventions   Provided Verbal Education On Mental Health/Coping with Illness  [feeling of hopeless no SI]  Mental Health Interventions   Mental Health Discussed/Reviewed Mental Health Reviewed, Anxiety, Coping  Strategies, Depression  [active listening,  emotional support,  resetting with anxiety and emotions]       Follow up plan: Follow up call scheduled for 10/01/22    Encounter Outcome:  Pt. Visit Completed   [name], LCSW Social Work Care Coordination  [facility] [employer] [phone]

## 2022-09-30 ENCOUNTER — Encounter: Payer: Medicare Other | Admitting: Licensed Clinical Social Worker

## 2022-10-01 ENCOUNTER — Ambulatory Visit: Payer: Self-pay | Admitting: Licensed Clinical Social Worker

## 2022-10-01 ENCOUNTER — Other Ambulatory Visit: Payer: Self-pay | Admitting: Internal Medicine

## 2022-10-01 DIAGNOSIS — E1165 Type 2 diabetes mellitus with hyperglycemia: Secondary | ICD-10-CM

## 2022-10-01 DIAGNOSIS — I428 Other cardiomyopathies: Secondary | ICD-10-CM

## 2022-10-01 MED ORDER — DAPAGLIFLOZIN PRO-METFORMIN ER 10-1000 MG PO TB24
1.0000 | ORAL_TABLET | Freq: Every day | ORAL | 0 refills | Status: DC
Start: 2022-10-01 — End: 2022-12-17

## 2022-10-01 NOTE — Patient Instructions (Signed)
Social Work Visit Information  Thank you for taking time to visit with me today. Please don't hesitate to contact me if I can be of assistance to you.   Following are the goals we discussed today:   Goals Addressed             This Visit's Progress    Care Coordination Activities for mental & physical health support       Activities in order to accomplish goals.   Congratulations on making progress with physical therapy  Keep your appointment scheduled with [facility] [phone]  for medication management 10/10/22 [name] has on the cancellation list to get you in sooner  Continue with compliance of taking medication prescribed by Doctor and keep all medical appointments   You have decided to put this on hold to talk with [name] at the [employer]  [phone] for Medicare options.  At this time [name] is the only one that has all of your providers in network this includes $320 for flex dollars for Benefit card; , dental ,  0 doctor visit, 0 for therapy ; Silver Sneakers , foot care &   purse devise with life station ( "fallen and can't get up").         No follow up scheduled with social work at this time. Will follow up in 2 weeks .Patient will call office if needed prior to next encounter.  Please call the care guide team at [phone] if you need to cancel or reschedule your appointment.   If you or anyone you know are experiencing a Mental Health or Behavioral Health Crisis or need someone to talk to, please call the Suicide and Crisis Lifeline: 988 call the [place] National Suicide Prevention Lifeline: 808-271-6017 or TTY: 772-798-6117 TTY 346-035-2357) to talk to a trained counselor call 1-800-273-TALK (toll free, 24 hour hotline) go to [facility] [address], [place] [phone])   Patient verbalizes understanding of instructions and care plan provided today and agrees to view in MyChart. Active  MyChart status and patient understanding of how to access instructions and care plan via MyChart confirmed with patient.       [name], LCSW Social Work Care Coordination  [facility] [employer] [phone]

## 2022-10-01 NOTE — Patient Outreach (Signed)
  Care Coordination  Follow Up Visit Note   10/01/2022 Name: [name] MRN: [id number] DOB: 1965/05/11  [name] is a 57 y.o. year old female who sees [name], MD for primary care. I spoke with  [name] by phone today.  What matters to the patients health and wellness today?    Patient continues to experience difficulty with managing her mental health. Reports no missed doses of medication however noticed increase in sensitivity with sound, increased sleep and over eating.  She is currently on the cancellation list with her mental health provider and scheduled appointment is 10/10/22. Patient unable to make schedule f/u appointment at this time.     Goals Addressed             This Visit's Progress    Care Coordination Activities for mental & physical health support       Activities in order to accomplish goals.   Congratulations on making progress with physical therapy  Keep your appointment scheduled with [facility] [phone]  for medication management 10/10/22 [name] has on the cancellation list to get you in sooner  Continue with compliance of taking medication prescribed by Doctor and keep all medical appointments   You have decided to put this on hold to talk with [name] at the [employer]  [phone] for Medicare options.  At this time [name] is the only one that has all of your providers in network this includes $320 for flex dollars for Benefit card; , dental ,  0 doctor visit, 0 for therapy ; Silver Sneakers , foot care &   purse devise with life station ( "fallen and can't get up").        SDOH assessments and interventions completed:  No   Care Coordination Interventions:  Yes, provided  Interventions Today    Flowsheet Row Most Recent Value  Chronic Disease   Chronic disease during today's visit Hypertension (HTN), Congestive Heart Failure (CHF), Diabetes  General Interventions   General Interventions  Discussed/Reviewed General Interventions Reviewed  Mental Health Interventions   Mental Health Discussed/Reviewed Mental Health Reviewed, Anxiety, Depression, Coping Strategies, Crisis  Pharmacy Interventions   Pharmacy Dicussed/Reviewed Pharmacy Topics Reviewed  [reports no missed dose with mental health medication]       Follow up plan:  will f/u with patient in 2 weeks    Encounter Outcome:  Pt. Visit Completed   [name], LCSW Social Work Care Coordination  [facility] [employer] [phone]

## 2022-10-05 ENCOUNTER — Other Ambulatory Visit: Payer: Self-pay | Admitting: Internal Medicine

## 2022-10-05 DIAGNOSIS — E785 Hyperlipidemia, unspecified: Secondary | ICD-10-CM

## 2022-10-09 ENCOUNTER — Ambulatory Visit (HOSPITAL_BASED_OUTPATIENT_CLINIC_OR_DEPARTMENT_OTHER): Payer: Medicare Other

## 2022-10-10 DIAGNOSIS — F3181 Bipolar II disorder: Secondary | ICD-10-CM | POA: Diagnosis not present

## 2022-10-10 DIAGNOSIS — F411 Generalized anxiety disorder: Secondary | ICD-10-CM | POA: Diagnosis not present

## 2022-10-10 DIAGNOSIS — F909 Attention-deficit hyperactivity disorder, unspecified type: Secondary | ICD-10-CM | POA: Diagnosis not present

## 2022-10-12 ENCOUNTER — Other Ambulatory Visit: Payer: Self-pay | Admitting: Internal Medicine

## 2022-10-12 DIAGNOSIS — E1165 Type 2 diabetes mellitus with hyperglycemia: Secondary | ICD-10-CM

## 2022-10-14 ENCOUNTER — Other Ambulatory Visit: Payer: Self-pay | Admitting: Internal Medicine

## 2022-10-14 DIAGNOSIS — E1165 Type 2 diabetes mellitus with hyperglycemia: Secondary | ICD-10-CM

## 2022-10-14 NOTE — Telephone Encounter (Signed)
Pharmacy comment: Product Backordered/Unavailable:PRESCRIBED PRODUCT NOT IN STOCK. PLEASE CONSIDER THE COST-EFFECTIVE POTENTIAL ALTERNATIVE(S) LISTED AND EVALUATE IF APPROPRIATE FOR YOUR PATIENT'S INDICATION AND TREATMENT GOALS.

## 2022-10-16 ENCOUNTER — Ambulatory Visit (HOSPITAL_BASED_OUTPATIENT_CLINIC_OR_DEPARTMENT_OTHER): Payer: Medicare Other | Admitting: Physical Therapy

## 2022-10-21 DIAGNOSIS — F3181 Bipolar II disorder: Secondary | ICD-10-CM | POA: Diagnosis not present

## 2022-10-21 DIAGNOSIS — F909 Attention-deficit hyperactivity disorder, unspecified type: Secondary | ICD-10-CM | POA: Diagnosis not present

## 2022-10-21 DIAGNOSIS — F411 Generalized anxiety disorder: Secondary | ICD-10-CM | POA: Diagnosis not present

## 2022-10-23 ENCOUNTER — Ambulatory Visit (HOSPITAL_BASED_OUTPATIENT_CLINIC_OR_DEPARTMENT_OTHER): Payer: Medicare Other

## 2022-10-24 ENCOUNTER — Encounter: Payer: Self-pay | Admitting: Gastroenterology

## 2022-10-24 ENCOUNTER — Ambulatory Visit (HOSPITAL_COMMUNITY): Payer: Medicare Other | Admitting: Physician Assistant

## 2022-10-24 DIAGNOSIS — F419 Anxiety disorder, unspecified: Secondary | ICD-10-CM

## 2022-10-24 DIAGNOSIS — F332 Major depressive disorder, recurrent severe without psychotic features: Secondary | ICD-10-CM | POA: Diagnosis not present

## 2022-10-24 DIAGNOSIS — F988 Other specified behavioral and emotional disorders with onset usually occurring in childhood and adolescence: Secondary | ICD-10-CM | POA: Diagnosis not present
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
## 2022-10-27 ENCOUNTER — Encounter (HOSPITAL_COMMUNITY): Payer: Self-pay | Admitting: Physician Assistant

## 2022-10-27 DIAGNOSIS — F419 Anxiety disorder, unspecified: Secondary | ICD-10-CM | POA: Insufficient documentation

## 2022-10-27 MED ORDER — ESCITALOPRAM OXALATE 5 MG PO TABS: ORAL_TABLET | ORAL | 1 refills | Status: DC

## 2022-10-27 MED ORDER — GABAPENTIN 100 MG PO CAPS
100.0000 mg | ORAL_CAPSULE | Freq: Three times a day (TID) | ORAL | 1 refills | Status: DC
Start: 2022-10-27 — End: 2022-12-05

## 2022-10-29 ENCOUNTER — Ambulatory Visit: Payer: Self-pay | Admitting: Licensed Clinical Social Worker

## 2022-10-29 NOTE — Patient Outreach (Signed)
  Care Coordination  Follow Up Visit Note   10/29/2022 Name: [name] MRN: [id number] DOB: 1965/10/28  [name] is a 57 y.o. year old female who sees [name], MD for primary care. I spoke with  [name] by phone today.  What matters to the patients health and wellness today?    Patient continues to experience with managing her mental health, continued barriers with connecting to a mental health provider and getting the treatment that she needs for therapy and medication management.    Goals Addressed             This Visit's Progress    COMPLETED: Connect with a mental health provider for therapy and medication management       Activities in order to accomplish goals.   Congratulations on making progress with physical therapy, we discussed reconnecting  Keep your appointment scheduled with [facility] [phone]  unless you decided to continue with the provider at [facility] in Oct. Continue with compliance of taking medication prescribed by Doctor and keep all medical appointments   You have decided to put this on hold to talk with [name] at the [facility]  [phone] for Medicare options.  At this time [name] is the only one that has all of your providers in network this includes $320 for flex dollars for Benefit card; , dental ,  0 doctor visit, 0 for therapy ; Silver Sneakers , foot care &   purse devise with life station.  You many want to revisit these options.        SDOH assessments and interventions completed:  No  Care Coordination Interventions:  Yes, provided  Interventions Today    Flowsheet Row Most Recent Value  Chronic Disease   Chronic disease during today's visit Hypertension (HTN), Diabetes, Congestive Heart Failure (CHF)  Education Interventions   Education Provided Provided Education  Mental Health Interventions   Mental Health Discussed/Reviewed Mental Health Reviewed, Coping Strategies,  Anxiety, Depression  [active listening, solution focused, problem solving]      This LCSW will be transitioning out. Handoff provided to new Child psychotherapist. Follow up plan: Referral made to Lorna Few LCSW Follow up call scheduled for 11/12/2022    Encounter Outcome:  Pt. Visit Completed   [name], LCSW [place]  [facility], [facility] Licensed Clinical Social Work Care Coordinator  Direct Dial: [phone]

## 2022-10-29 NOTE — Patient Instructions (Signed)
Social Work Visit Information  Thank you for taking time to visit with me today. Please don't hesitate to contact me if I can be of assistance to you.   Following are the goals we discussed today:   Goals Addressed             This Visit's Progress    COMPLETED: Connect with a mental health provider for therapy and medication management       Activities in order to accomplish goals.   Congratulations on making progress with physical therapy, we discussed reconnecting  Keep your appointment scheduled with [facility] [phone]  unless you decided to continue with the provider at [facility] in Oct. Continue with compliance of taking medication prescribed by Doctor and keep all medical appointments   You have decided to put this on hold to talk with [name] at the [facility]  [phone] for Medicare options.  At this time [name] is the only one that has all of your providers in network this includes $320 for flex dollars for Benefit card; , dental ,  0 doctor visit, 0 for therapy ; Silver Sneakers , foot care &   purse devise with life station.  You many want to revisit these options.         Your next appointment is by telephone on 11/12/22 at 3:00 with [facility]  Please call the care guide team at [phone] if you need to cancel or reschedule your appointment.   If you or anyone you know are experiencing a Mental Health or Behavioral Health Crisis or need someone to talk to, please call the Suicide and Crisis Lifeline: 988 call the [place] National Suicide Prevention Lifeline: 206 724 5310 or TTY: 903-769-4159 TTY (209)420-6133) to talk to a trained counselor call 1-800-273-TALK (toll free, 24 hour hotline) go to [facility] [address] [place] [phone])   The patient verbalized understanding of instructions, educational materials, and care plan provided today and DECLINED offer to receive  copy of patient instructions, educational materials, and care plan.     [name], LCSW [place]  [facility], [facility] Licensed Clinical Social Work Care Coordinator  Direct Dial: [phone]

## 2022-10-30 ENCOUNTER — Ambulatory Visit (HOSPITAL_COMMUNITY): Payer: Medicare Other | Admitting: Physician Assistant

## 2022-11-04 ENCOUNTER — Other Ambulatory Visit: Payer: Self-pay | Admitting: Physician Assistant

## 2022-11-04 ENCOUNTER — Telehealth (HOSPITAL_COMMUNITY): Payer: Self-pay | Admitting: Physician Assistant

## 2022-11-04 DIAGNOSIS — I5022 Chronic systolic (congestive) heart failure: Secondary | ICD-10-CM

## 2022-11-06 ENCOUNTER — Encounter (HOSPITAL_BASED_OUTPATIENT_CLINIC_OR_DEPARTMENT_OTHER): Payer: Medicare Other

## 2022-11-07 ENCOUNTER — Telehealth (HOSPITAL_COMMUNITY): Payer: Self-pay | Admitting: Physician Assistant

## 2022-11-08 ENCOUNTER — Other Ambulatory Visit (HOSPITAL_COMMUNITY): Payer: Self-pay | Admitting: Physician Assistant

## 2022-11-08 ENCOUNTER — Other Ambulatory Visit: Payer: Self-pay | Admitting: Internal Medicine

## 2022-11-08 DIAGNOSIS — F332 Major depressive disorder, recurrent severe without psychotic features: Secondary | ICD-10-CM

## 2022-11-08 DIAGNOSIS — L719 Rosacea, unspecified: Secondary | ICD-10-CM

## 2022-11-08 MED ORDER — CARIPRAZINE HCL 1.5 MG PO CAPS
1.5000 mg | ORAL_CAPSULE | Freq: Every day | ORAL | 1 refills | Status: DC
Start: 2022-11-08 — End: 2022-12-05

## 2022-11-08 NOTE — Progress Notes (Signed)
Provider was contacted by [place] regarding patient's request for medication to be refilled to pharmacy of choice.  The medication in question is patient's Vraylar, a medication that was prescribed to her by her previous psychiatric provider.  Patient is currently taking Vraylar 1.5 mg daily as an adjunct to the management of her depressive symptoms.  Patient's medication to be e-prescribed to pharmacy of choice.

## 2022-11-08 NOTE — Telephone Encounter (Signed)
Message acknowledged and reviewed. Patient's medication to be e-prescribed to pharmacy of choice.

## 2022-11-09 ENCOUNTER — Other Ambulatory Visit: Payer: Self-pay | Admitting: Internal Medicine

## 2022-11-09 DIAGNOSIS — M5136 Other intervertebral disc degeneration, lumbar region: Secondary | ICD-10-CM

## 2022-11-12 ENCOUNTER — Ambulatory Visit: Payer: Self-pay | Admitting: Licensed Clinical Social Worker

## 2022-11-12 NOTE — Patient Outreach (Signed)
Care Coordination   Initial Visit Note   11/12/2022 Name: [name] MRN: [id number] DOB: [birth year]  [name] is a 57 y.o. year old female who sees Samantha Grandchild, MD for primary care. I spoke with  [name] by phone today.  What matters to the patients health and wellness today?  Patient saw psychologist on August 29,2024. She is trying to take medications as prescribed She spoke of her anxiety issues faced     Goals Addressed             This Visit's Progress    Patient saw psychologist on August 29,2024. She is trying to take medications as prescribed She spoke of her anxiety issues faced       Interventions:  Spoke with client about her mental health needs. She did see psychologist on August 29,2024.  She is trying to take prescribed medications on schedule. She has appointment on 12/05/22 with psychologist Discussed family support. She has support from her spouse and from her son Discussed her support with Samantha Hines LCSW. Discussed ways [name] had helped her . Discussed current status and needs of client.   Client has appointment with counselor scheduled for 12/31/22.   Discussed relaxation techniques of choice:  She listens to music. She enjoys spending time with her spouse and with her son  Discussed that she feels more sad, depressed. She spoke of family history of depression.  She feels that she has knots in her stomach (stress). She has reduced hygiene, reduced showers.  She has low motivation. She says she has low activity level.  She said she wondered if some of her medications were making her more sleepy.  She has hard dime sleeping at night She spoke of pain issues faced She spoke of history of heart issue. She spoke of support from cardiologist Client spoke a good deal about her ADD issues and medications for ADD Discussed sleeping issues.  She said she is fatigued and tired often. Eyes tear up. She feels tired. She rests on the couch a good bit.   Client spoke of anxiety issues. She spoke in rapid fashion.  She had pressured speech. Client spoke of past bleeding issue Spoke of client insurance. She has Medicare A and B . She has Medicaid.  Client feels exhausted.  She is trying to adjust to her medications prescribed.  Encouraged client to call LCSW as needed for SW support at [phone] Client wrote down LCSW name and phone number and was appreciative of call from LCSW today         SDOH assessments and interventions completed:  Yes  SDOH Interventions Today    Flowsheet Row Most Recent Value  SDOH Interventions   Depression Interventions/Treatment  Medication, Currently on Treatment  Physical Activity Interventions Other (Comments)  [patient has decreased activities]  Stress Interventions Other (Comment)  [discussed client needs. She has stress in managing mental health needs. She has stress in managing medical needs]        Care Coordination Interventions:  Yes, provided   Interventions Today    Flowsheet Row Most Recent Value  Chronic Disease   Chronic disease during today's visit Other  [spoke with client about client needs]  General Interventions   General Interventions Discussed/Reviewed General Interventions Discussed, Community Resources  [discussed program support]  Exercise Interventions   Exercise Discussed/Reviewed Physical Activity  [decreased activity]  Physical Activity Discussed/Reviewed Physical Activity Reviewed  [name] discussed prior participation in pool therapy]  Education Interventions  Education Provided Provided Education  Provided Engineer, [employer]  Mental Health Interventions   Mental Health Discussed/Reviewed Coping Strategies, Anxiety  [discussed mental health needs. She had appointment with psychologist 10/24/22. She is trying to sort out her use of prescribed medications. She has called psychologist to discuss mental health medications and is waiting on return  call from psychologist.]  Pharmacy Interventions   Pharmacy Dicussed/Reviewed Pharmacy Topics Discussed  Safety Interventions   Safety Discussed/Reviewed Fall Risk        Follow up plan: Client has LCSW name and phone number and will call LCSW in next 2 weeks if needed  Encounter Outcome:  Patient Visit Completed   Kelton Pillar.Jamire Shabazz MSW, LCSW Licensed Visual merchandiser [facility] Care Management [phone]

## 2022-11-12 NOTE — Patient Instructions (Signed)
Visit Information  Thank you for taking time to visit with me today. Please don't hesitate to contact me if I can be of assistance to you.   Following are the goals we discussed today:   Goals Addressed             This Visit's Progress    Patient saw psychologist on August 29,2024. She is trying to take medications as prescribed She spoke of her anxiety issues faced       Interventions:  Spoke with client about her mental health needs. She did see psychologist on August 29,2024.  She is trying to take prescribed medications on schedule. She has appointment on 12/05/22 with psychologist Discussed family support. She has support from her spouse and from her son Discussed her support with [name] LCSW. Discussed ways [name] had helped her . Discussed current status and needs of client.   Client has appointment with counselor scheduled for 12/31/22.   Discussed relaxation techniques of choice:  She listens to music. She enjoys spending time with her spouse and with her son  Discussed that she feels more sad, depressed. She spoke of family history of depression.  She feels that she has knots in her stomach (stress). She has reduced hygiene, reduced showers.  She has low motivation. She says she has low activity level.  She said she wondered if some of her medications were making her more sleepy.  She has hard dime sleeping at night She spoke of pain issues faced She spoke of history of heart issue. She spoke of support from cardiologist Client spoke a good deal about her ADD issues and medications for ADD Discussed sleeping issues.  She said she is fatigued and tired often. Eyes tear up. She feels tired. She rests on the couch a good bit.  Client spoke of anxiety issues. She spoke in rapid fashion.  She had pressured speech. Client spoke of past bleeding issue Spoke of client insurance. She has Medicare A and B . She has Medicaid.  Client feels exhausted.  She is trying to  adjust to her medications prescribed.  Encouraged client to call LCSW as needed for SW support at [phone] Client wrote down LCSW name and phone number and was appreciative of call from LCSW today        Client has LCSW name and phone number and will call LCSW in next 2 weeks as needed   Please call the care guide team at [phone] if you need to cancel or reschedule your appointment.   If you are experiencing a Mental Health or Behavioral Health Crisis or need someone to talk to, please go to [facility] [address], [place] [phone])   The patient verbalized understanding of instructions, educational materials, and care plan provided today and DECLINED offer to receive copy of patient instructions, educational materials, and care plan.   The patient has been provided with contact information for the care management team and has been advised to call with any health related questions or concerns.   Kelton Pillar.Valyncia Wiens MSW, LCSW Licensed Visual merchandiser [facility] Care Management [phone]

## 2022-11-13 ENCOUNTER — Telehealth: Payer: Self-pay | Admitting: Internal Medicine

## 2022-11-13 NOTE — Telephone Encounter (Signed)
Pt is having uti and yeast infection symptoms and want to know if she needs to set up an appointment with you or not. She is really recommending that she be prescribed something because she CAN NOT wait.. She does not use my chart so someone will need to reach out to her via phone.

## 2022-11-14 ENCOUNTER — Other Ambulatory Visit (HOSPITAL_COMMUNITY): Payer: Self-pay | Admitting: Physician Assistant

## 2022-11-14 DIAGNOSIS — F988 Other specified behavioral and emotional disorders with onset usually occurring in childhood and adolescence: Secondary | ICD-10-CM

## 2022-11-15 ENCOUNTER — Ambulatory Visit (INDEPENDENT_AMBULATORY_CARE_PROVIDER_SITE_OTHER): Payer: Medicare Other | Admitting: Nurse Practitioner

## 2022-11-15 VITALS — BP 128/84 | HR 84 | Temp 98.5°F | Ht 64.0 in | Wt 300.4 lb

## 2022-11-15 DIAGNOSIS — B351 Tinea unguium: Secondary | ICD-10-CM | POA: Diagnosis not present

## 2022-11-15 DIAGNOSIS — E1165 Type 2 diabetes mellitus with hyperglycemia: Secondary | ICD-10-CM | POA: Diagnosis not present

## 2022-11-15 DIAGNOSIS — R55 Syncope and collapse: Secondary | ICD-10-CM | POA: Insufficient documentation

## 2022-11-15 DIAGNOSIS — Z7985 Long-term (current) use of injectable non-insulin antidiabetic drugs: Secondary | ICD-10-CM | POA: Diagnosis not present

## 2022-11-15 DIAGNOSIS — B3731 Acute candidiasis of vulva and vagina: Secondary | ICD-10-CM

## 2022-11-15 DIAGNOSIS — R300 Dysuria: Secondary | ICD-10-CM | POA: Diagnosis not present

## 2022-11-15 LAB — POC URINALSYSI DIPSTICK (AUTOMATED)
Bilirubin, UA: NEGATIVE
Blood, UA: NEGATIVE
Glucose, UA: POSITIVE — AB
Ketones, UA: NEGATIVE
Leukocytes, UA: NEGATIVE
Nitrite, UA: NEGATIVE
Protein, UA: NEGATIVE
Spec Grav, UA: 1.02 (ref 1.010–1.025)
Urobilinogen, UA: 0.2 E.U./dL
pH, UA: 5 (ref 5.0–8.0)

## 2022-11-15 LAB — POCT URINE PREGNANCY: Preg Test, Ur: NEGATIVE

## 2022-11-15 MED ORDER — LANCETS MISC. MISC: 1.0000 | Freq: Three times a day (TID) | 11 refills | Status: DC

## 2022-11-15 MED ORDER — BLOOD GLUCOSE MONITORING SUPPL DEVI: 1.0000 | Freq: Three times a day (TID) | 0 refills | Status: DC

## 2022-11-15 MED ORDER — LANCET DEVICE MISC: 1.0000 | Freq: Three times a day (TID) | 0 refills | Status: DC

## 2022-11-15 MED ORDER — FLUCONAZOLE 150 MG PO TABS: 150.0000 mg | ORAL_TABLET | Freq: Every day | ORAL | 0 refills | Status: DC

## 2022-11-15 MED ORDER — BLOOD GLUCOSE TEST VI STRP: 1.0000 | ORAL_STRIP | Freq: Three times a day (TID) | 11 refills | Status: DC

## 2022-11-15 NOTE — Assessment & Plan Note (Signed)
Acute Etiology unclear. Recommend following up with cardiology to discuss further evaluation will also refer to neurology to rule out seizure activity as well as to evaluate memory concerns.

## 2022-11-15 NOTE — Progress Notes (Signed)
Established Patient Office Visit  Subjective   Patient ID: [name], female    DOB: [birth year]  Age: 57 y.o. MRN: [id number]  Chief Complaint  Patient presents with   Urinary Tract Infection    Burning sensation for about a month now     Patient arrives today for acute visit for the above.  She also has multiple other complaints that she would like to discuss.  Dysuria: Started about 1 month ago.  Patient having dysuria.  Denies any new sexual partners, reports being in a monogamous relationship.  Perimenopausal is still having vaginal bleeding with irregularity.  Near syncopal episode/memory issues: Reports a near syncopal episode a few weeks ago as well as frequent issues with memory and difficulty with her word finding.  Reports she was sitting her chair when all of a sudden she felt tunnel vision.  This was witnessed by husband.  She reports that he denies witnessing any jerky muscle movements.  She also denies true loss of consciousness, however he reports that she stopped responding to him verbally during the episode.  She has type 2 diabetes but did not check blood sugar at the time of this episode.  She also has nonischemic cardiomyopathy as well as CHF.  Toenail discoloration: Located to the third digit of left foot.  No pain to foot.    Review of Systems  Cardiovascular:  Negative for chest pain and palpitations.  Genitourinary:  Positive for dysuria. Negative for frequency, hematuria and urgency.  Neurological:  Positive for loss of consciousness (near syncope).      Objective:     BP 128/84   Pulse 84   Temp 98.5 F (36.9 C) (Temporal)   Ht 5\' 4"  (1.626 m)   Wt (!) 300 lb 6 oz (136.2 kg)   SpO2 95%   BMI 51.56 kg/m    Physical Exam Vitals reviewed.  Constitutional:      General: She is not in acute distress.    Appearance: Normal appearance.  HENT:     Head: Normocephalic and atraumatic.  Neck:     Vascular: No carotid bruit.  Cardiovascular:      Rate and Rhythm: Normal rate and regular rhythm.     Pulses:          Dorsalis pedis pulses are 1+ on the left side.     Heart sounds: Normal heart sounds.  Pulmonary:     Effort: Pulmonary effort is normal.     Breath sounds: Normal breath sounds.  Musculoskeletal:       Feet:  Feet:     Left foot:     Toenail Condition: Left toenails are abnormally thick. Fungal disease present. Skin:    General: Skin is warm and dry.  Neurological:     General: No focal deficit present.     Mental Status: She is alert and oriented to person, place, and time.  Psychiatric:        Mood and Affect: Mood normal.        Behavior: Behavior normal.        Judgment: Judgment normal.      Results for orders placed or performed in visit on 11/15/22  POCT urine pregnancy  Result Value Ref Range   Preg Test, Ur Negative Negative  POCT Urinalysis Dipstick (Automated)  Result Value Ref Range   Color, UA     Clarity, UA     Glucose, UA Positive (A) Negative   Bilirubin, UA negative  Ketones, UA negative    Spec Grav, UA 1.020 1.010 - 1.025   Blood, UA negative    pH, UA 5.0 5.0 - 8.0   Protein, UA Negative Negative   Urobilinogen, UA 0.2 0.2 or 1.0 E.U./dL   Nitrite, UA negative    Leukocytes, UA Negative Negative      The 10-year ASCVD risk score (Arnett DK, et al., 2019) is: 6.7%    Assessment & Plan:   Problem List Items Addressed This Visit       Cardiovascular and Mediastinum   Near syncope    Acute Etiology unclear. Recommend following up with cardiology to discuss further evaluation will also refer to neurology to rule out seizure activity as well as to evaluate memory concerns.      Relevant Orders   Ambulatory referral to Neurology     Endocrine   Type 2 diabetes mellitus with hyperglycemia (HCC)    Chronic Reports that she does not have a glucometer.  Glucometer prescription sent to pharmacy so she can have this on hand to check blood sugar daily and as needed.       Relevant Medications   Blood Glucose Monitoring Suppl DEVI   Glucose Blood (BLOOD GLUCOSE TEST STRIPS) STRP   Lancet Device MISC   Lancets Misc. MISC     Musculoskeletal and Integument   Onychomycosis    Referral to podiatry and dermatology made today.      Relevant Medications   fluconazole (DIFLUCAN) 150 MG tablet   Other Relevant Orders   Ambulatory referral to Podiatry   Ambulatory referral to Dermatology     Genitourinary   Vaginal yeast infection    No evidence of UTI on point-of-care urinalysis.  Will treat empirically for vaginal yeast infection with Diflucan 150 mg tablet x 1.  Will send off urine for culture to verify no UTIs present.  Further recommendations may be made based upon these results.      Relevant Medications   fluconazole (DIFLUCAN) 150 MG tablet   Other Relevant Orders   POCT urine pregnancy (Completed)     Other   Dysuria - Primary    No evidence of UTI on point-of-care urinalysis.  Will treat empirically for vaginal yeast infection with Diflucan 150 mg tablet x 1.  Will send off urine for culture to verify no UTIs present.  Further recommendations may be made based upon these results.      Relevant Orders   Urine Culture   POCT Urinalysis Dipstick (Automated) (Completed)    Return if symptoms worsen or fail to improve.  Total time spent on encounter today was 40 minutes including face-to-face evaluation, review of previous records, and development/discussion of treatment plan.   Elenore Paddy, NP

## 2022-11-15 NOTE — Assessment & Plan Note (Signed)
Chronic Reports that she does not have a glucometer.  Glucometer prescription sent to pharmacy so she can have this on hand to check blood sugar daily and as needed.

## 2022-11-15 NOTE — Assessment & Plan Note (Signed)
Referral to podiatry and dermatology made today.

## 2022-11-15 NOTE — Assessment & Plan Note (Signed)
No evidence of UTI on point-of-care urinalysis.  Will treat empirically for vaginal yeast infection with Diflucan 150 mg tablet x 1.  Will send off urine for culture to verify no UTIs present.  Further recommendations may be made based upon these results.

## 2022-11-16 LAB — URINE CULTURE: Result:: NO GROWTH

## 2022-11-19 ENCOUNTER — Other Ambulatory Visit (HOSPITAL_COMMUNITY): Payer: Self-pay | Admitting: Physician Assistant

## 2022-11-19 ENCOUNTER — Encounter (HOSPITAL_BASED_OUTPATIENT_CLINIC_OR_DEPARTMENT_OTHER): Payer: Self-pay | Admitting: Physical Therapy

## 2022-11-19 ENCOUNTER — Ambulatory Visit (HOSPITAL_BASED_OUTPATIENT_CLINIC_OR_DEPARTMENT_OTHER): Payer: Medicare Other | Attending: Family Medicine | Admitting: Physical Therapy

## 2022-11-19 DIAGNOSIS — F332 Major depressive disorder, recurrent severe without psychotic features: Secondary | ICD-10-CM

## 2022-11-19 DIAGNOSIS — M6281 Muscle weakness (generalized): Secondary | ICD-10-CM | POA: Insufficient documentation

## 2022-11-19 DIAGNOSIS — M25561 Pain in right knee: Secondary | ICD-10-CM | POA: Insufficient documentation

## 2022-11-19 DIAGNOSIS — F419 Anxiety disorder, unspecified: Secondary | ICD-10-CM

## 2022-11-19 DIAGNOSIS — G8929 Other chronic pain: Secondary | ICD-10-CM | POA: Insufficient documentation

## 2022-11-19 DIAGNOSIS — M5459 Other low back pain: Secondary | ICD-10-CM | POA: Insufficient documentation

## 2022-11-19 NOTE — Therapy (Signed)
OUTPATIENT PHYSICAL THERAPY THORACOLUMBAR TREATMENT   Patient Name: [name] MRN: [id number] DOB:17-Sep-1965, 57 y.o., female Today's Date: 11/19/2022  END OF SESSION:  PT End of Session - 11/19/22 1822     Visit Number 28    Number of Visits 38    Date for PT Re-Evaluation 01/14/23    Authorization Type MCR    Progress Note Due on Visit 38    PT Start Time 1701    PT Stop Time 1745    PT Time Calculation (min) 44 min    Activity Tolerance Patient tolerated treatment well    Behavior During Therapy WFL for tasks assessed/performed            PT End of Session -     Visit Number 27   Number of Visits 32   Date for PT Re-Evaluation 11/08/22   Authorization Type    PT Start Time     PT Stop Time    PT Time Calculation (min)    Activity Tolerance Tol well   Behavior During Therapy  wfl        Past Medical History:  Diagnosis Date   ADD (attention deficit disorder)    Anxiety    Arthritis    both knees right worse than left   Carpal tunnel syndrome of right wrist    Depression    Essential hypertension, benign    Gallstones    History of smoking 06/22/2016   Hyperlipidemia associated with type 2 diabetes mellitus (HCC), on Zocor 04/04/2011   Hypertension associated with diabetes (HCC) 06/03/2008   Migraines    Morbid obesity (HCC)    Morbid obesity with BMI of 50.0-59.9, adult (HCC) 07/12/2006   NICM (nonischemic cardiomyopathy) (HCC) 07/12/2006   01/16/18 ECHO:    - Procedure narrative: Transthoracic echocardiography. Image   quality was suboptimal. The study was technically difficult.   Intravenous contrast (Definity) was administered. - Left ventricle: The cavity size was moderately dilated. Wall   thickness was increased in a pattern of mild LVH. Systolic   function was moderately to severely reduced. The estimated   ejection fraction w   Normal coronary arteries 04/17/2016   OSA on CPAP 10/28/2014   Spinal stenosis    back pain   Spondylosis without  myelopathy or radiculopathy, lumbar region 12/04/2017   Type 2 diabetes mellitus with hyperglycemia [facility])    Past Surgical History:  Procedure Laterality Date   CHOLECYSTECTOMY     DILATATION & CURETTAGE/HYSTEROSCOPY WITH MYOSURE N/A 10/31/2017   Procedure: DILATATION & CURETTAGE/HYSTEROSCOPY WITH MYOSURE;  Surgeon: Romualdo Bolk, MD;  Location: [facility];  Service: Gynecology;  Laterality: N/A;   HYSTEROSCOPY WITH D & C N/A 05/07/2022   Procedure: DILATATION AND CURETTAGE /HYSTEROSCOPY;  Surgeon: [name], MD;  Location: [facility] OR;  Service: Gynecology;  Laterality: N/A;   RIGHT/LEFT HEART CATH AND CORONARY ANGIOGRAPHY N/A 04/11/2016   Procedure: Right/Left Heart Cath and Coronary Angiography;  Surgeon: Kathleene Hazel, MD;  Location: [facility] INVASIVE CV LAB;  Service: Cardiovascular;  Laterality: N/A;   sonogram for blood clots     no blockages   Patient Active Problem List   Diagnosis Date Noted   Dysuria 11/15/2022   Onychomycosis 11/15/2022   Near syncope 11/15/2022   Anxiety 10/27/2022   Rosacea, acne 08/13/2022   BRBPR (bright red blood per rectum) 08/13/2022   Vaginal yeast infection 03/06/2022   Primary osteoarthritis of both knees 03/06/2022   Bilateral ovarian cysts 03/06/2022  Major depressive disorder, recurrent episode, severe with anxious distress (HCC) 08/09/2021   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   Need for varicella vaccine 08/09/2021   Urge incontinence 08/01/2021   Tinea corporis 03/14/2021   Hyperlipidemia LDL goal <100 03/01/2021   DDD (degenerative disc disease), lumbar 08/08/2020   Non-seasonal allergic rhinitis due to pollen 07/25/2020   Chronic idiopathic constipation 07/25/2020   Visit for screening mammogram 02/11/2020   Chronic bilateral low back pain without sciatica 02/10/2020   Spondylosis without myelopathy or radiculopathy, lumbar region 12/04/2017   Type 2 diabetes mellitus with hyperglycemia (HCC)     Normal coronary arteries 04/17/2016   OSA on CPAP 10/28/2014   Chronic systolic CHF (congestive heart failure), NYHA class 3 (HCC) 12/06/2013   Hyperlipidemia associated with type 2 diabetes mellitus (HCC), on Zocor 04/04/2011   Hypertension associated with diabetes (HCC) 06/03/2008   Morbid obesity with BMI of 50.0-59.9, adult (HCC) 07/12/2006   NICM (nonischemic cardiomyopathy) (HCC) 07/12/2006   Migraine without status migrainosus, not intractable 07/12/2006    PCP: [name], MD   REFERRING PROVIDER: Rodolph Bong, MD   REFERRING DIAG:   Chronic pain of right knee  M54.50,G89.29 (ICD-10-CM) - Chronic bilateral low back pain without sciatica    Rationale for Evaluation and Treatment: Rehabilitation  THERAPY DIAG:  Other low back pain  Muscle weakness (generalized)  Chronic pain of right knee  ONSET DATE: >5  SUBJECTIVE:                                                                                                                                                                                           SUBJECTIVE STATEMENT: Pt reports she has had a recurrence of her depressive disorder which has limited her ability to attend sessions. She feels she has regressed with her functional ability and mobility. Increased body aches/pain.  She does present walking to setting.      PERTINENT HISTORY:  [name] 03/19/22: 57 y.o. female with chronic low back pain and chronic right knee pain  . . . bothersome paresthesias sensation bilateral lower extremities.  . . . Right knee pain due to exacerbation of DJD.  . . . Chronic back pain thought to be due to core weakness and facet arthritis changes seen on CT scan.   PAIN:  Are you having pain? Yes: NPRS scale: 7-8/10 Pain location: LB to bilat hips, Rt shoulder, neck  Pain description: ache, pressure Aggravating factors: standing and moving, walking through home Relieving factors: sitting  PRECAUTIONS: Knee and  Back  WEIGHT BEARING RESTRICTIONS: No  FALLS:  Has patient fallen  in last 6 months? No  LIVING ENVIRONMENT: Lives with: lives with their family and lives with their spouse Lives in: House/apartment Stairs: Yes: Internal: 16 steps; on right going up Has following equipment at home: Walker - 4 wheeled and shower chair  OCCUPATION: disabled  PLOF: Needs assistance with ADLs, Needs assistance with homemaking, and Needs assistance with gait  PATIENT GOALS: increased movement without pain, ADL's, cook, climb stairs, go to the store without hurting  NEXT MD VISIT: 6 weeks  OBJECTIVE:   DIAGNOSTIC FINDINGS:  X-ray images right knee: Mild medial compartment DJD.  Mild patellofemoral DJD  CT scan images of lumbar spine: Diffuse degenerative changes present throughout lumbar spine   PATIENT SURVEYS:  FOTO Primary score 22% with goal 39% 06/28/22: 22% 08/07/22: Lumbar: 16%, knee: 29% 7/17: Lumbar: 44%, knee: 40%  SCREENING FOR RED FLAGS: none  COGNITION: Overall cognitive status: Within functional limits for tasks assessed     SENSATION: wfl  POSTURE: increased thoracic kyphosis,right shoulder elevated  PALPATION: No tenderness although difficulty to assess due to body habitus  LUMBAR ROM:   AROM eval 06/28/22 07/19/22 09/13/22 11/19/22  Flexion Mid shin   full full  Extension 0 10d wfl  wfl  Right lateral flexion 50% 75% full  75%  Left lateral flexion 50% 75% full  full  Right rotation 100%      Left rotation 100%       (Blank rows = not tested)  LOWER EXTREMITY ROM:      Limited due to body habitus  LOWER EXTREMITY MMT:    MMT Right eval Left eval 06/28/22 Right / left 07/19/22 Right / Left 08/21/22 Right/left 7/17 Right / Left  11/19/22 Right / Left  Hip flexion 3+ 3+ 4-/5 35.6 / 41.7 42.3/42.2 50.6/49.2 45.8 / 40.7  Hip extension         Hip abduction    35.9 / 33/9 35.7/37.0 41.1/40.6 36.4 / 30.3  Hip adduction         Hip internal rotation         Hip  external rotation         Knee flexion 4- 4- 4+ / 4+  24.7/34.0 36.2/44.2   Knee extension 4+ 4+ 5- / 5- 25.6 / 30.1 31.0/36.3 33.2/36.0 27.8 / 24.2  Ankle dorsiflexion         Ankle plantarflexion         Ankle inversion         Ankle eversion          (Blank rows = not tested)  LUMBAR SPECIAL TESTS:  Slump test: Negative  FUNCTIONAL TESTS:  5 times sit to stand: 24.16 seated on scooter Timed up and go (TUG): 20.90 from seated on scooter Berg: 35/56  06/21/22:  5x STS:  18.40 seated on scooter    TUG 12.91 from seated on scooter  06/28/22: Berg: 40/56    07/19/22 TUG: 14.85      5 x STS: 17.11  7/3: Berg 47/56 7/19: TUG 15.67           5 x STS: 16.45  11/19/22:  Berg:41/56  TUG:15.68  5 x STS: 20.03  GAIT: 09/13/22 Distance walked: 500 ft Assistive device utilized: None Level of assistance:  none Comments: increased sway R/L, becomes more exaggerated with fatigue  TODAY'S TREATMENT:       08/15/28 Re-cert TUG 5 x STS Berg  09/13/22 Re-assess TUG 5 x STS  Self care: Pt instruction on completion of  HEP; importance and benefits.  Chronic condition which requires daily exercises/stretching for proper management. Pain management; mental wellness  09/11/22 FOTO HHD muscle testing Gait training without AD  Standing marching 2x10 Standing HR/TR x20  Gait 354ft Nu-step 4 min L4 Trial of recumbent bike for use at [facility] x14min  HEP update and review of exercises  09/06/22  Pt seen for aquatic therapy today.  Treatment took place in water 3.5-4.75 ft in depth at the [facility] pool. Temp of water was 91.  Pt entered/exited the pool via stairs in step-to pattern with hand rail.  * without support:  walking forward/ backward with reciprocal arm swing *side step with shoulder add/abd with yellow->rainbow hand floats at 4 ft; walking forward / backward with rainbow hand floats under water swinging at sides * wall push up/off x 10 * UE on yellow hand floats:   leg swings into hip flex/ext and hip abdct/ addct; heel raises; squats (with vertical trunk)  * forward marching and forward walking kicks * UE on blue hand floats:  side to side pendulums x 4 each;  front to back pendulums x 4 with cues and demo for improved technique * return to walking forward/ backward     PATIENT EDUCATION:  Education details: Aquatic therapy progressions/ modifications Person educated: Patient Education method: Explanation Education comprehension: verbalized understanding  HOME EXERCISE PROGRAM:  Access Code: [email or web address] URL: [email or web address] Date: 09/11/2022 Prepared by: Riki Altes   Aquatic Access Code: [email or web address] URL: [email or web address] Date: 09/06/2022 Prepared by: [facility] [address] This aquatic home exercise program from [employer] utilizes pictures from land based exercises, but has been adapted prior to lamination and issuance.    ASSESSMENT:  CLINICAL IMPRESSION: Pt reports having a depressive disorder and had a relapse back in July.  Her functional testing demonstrates a decline in some areas.  She fortunately has not declined to base level at initial evaluation. She does report she has climbed her stairs a few times since last session which she was not completing prior.  She will continue to benefit from skilled physical therapy intervention.  Plan to alternate land and aquatics to slowly re-introduce her back to exercise.  She reports she has a hard time with motivation.  She is encouraged to investigate possibility of membership here at [facility] well as she feels  most comfortable in this setting.   09/13/22 PN:Pt has made good progress throughout in all areas as noted in above charts which demonstrates improvement in functional lower extremity strength, transitional movements, and balance. Of note her strength has improved significantly. Her FOTO scores increased to 44% (lumbar) and 40% (knee),  meeting both FOTO goals. She has currently met 4/6 STG and 1/6 LTG. She continues to have deficit in stair climbing negotiation (to full bath in home), toleration to amb as well as high pain sensitivity.  She does has membership to [employer] with acces to exercise machines and pool.  She will continue to benefit from skilled physical therapy 1 x every other week to advance HEP monitoring progress and reach remaining goals.     OBJECTIVE IMPAIRMENTS: decreased activity tolerance, decreased balance, decreased endurance, decreased knowledge of use of DME, decreased mobility, difficulty walking, decreased strength, impaired sensation, postural dysfunction, obesity, and pain.   ACTIVITY LIMITATIONS: carrying, lifting, bending, sitting, standing, squatting, stairs, transfers, bed mobility, locomotion level, and caring for others  PARTICIPATION LIMITATIONS: meal prep, cleaning, laundry, shopping, community activity, and occupation  PERSONAL FACTORS: Age, Behavior pattern, Fitness, Time  since onset of injury/illness/exacerbation, and 1-2 comorbidities: cardiomyopathy, Oa knees and back, depression/anxiety  are also affecting patient's functional outcome.   REHAB POTENTIAL: Good  CLINICAL DECISION MAKING: Evolving/moderate complexity  EVALUATION COMPLEXITY: Moderate   GOALS: Goals reviewed with patient? Yes  SHORT TERM GOALS: Target date: 06/24/22  Pt will tolerate full aquatic sessions consistently without increase in pain and with improving function to demonstrate good toleration and effectiveness of intervention.   Baseline:TBA Goal status:MET - 06/14/22  2.  Pt will climb stairs using combination of step to and alternating pattern without pausing consistently Baseline: improving - some pauses, but not consistent - 06/19/22 Goal status:ONGOING (still pauses); 11/19/22  3.  Pt will report climbing stairs at home 1-2 x weekly to access bedroom and full bath more regularly. Baseline: <1x weekly Pt  reports she has been showering at therapy and has not been climbing stairs Goal status: ongoing 09/11/22; Met 09/13/22  4.  Pt will tolerate walking 8-10 continuous minutes in pool to demonstrate improving toleration to activity Baseline: na Goal status: MET - 06/14/22  5.  Pt will improve on 5 X STS test to <or=   18s to demonstrate improving functional lower extremity strength, transitional movements, and balance Baseline: 24.16 at eval;  18.4s on 06/21/22 Goal status: MET 07/19/22 (17.11)  6.  Pt will improve on Tug test to <or= 14s to demonstrate improvement in lower extremity function, mobility and decreased fall risk.  Baseline:  20.90 at eval;  12.91 on 06/21/22 Goal status: MET   LONG TERM GOALS: Target date: 01/14/23  Pt to meet stated Foto Goal of 39% to demonstrate improved perception of ability Baseline: 22% Goal status: MET 7/17  2.  Pt will be indep with final HEP's (land and aquatic as appropriate) for continued management of condition  Baseline:  Goal status: in process 7/17; 11/19/22  3.  Pt will walk in store for up to or >15 mins pushing cart Baseline: rides scooter Goal status: Ongoing 7/17- still uses scooter; 11/19/22  4.  Pt will report decrease in pain with amb to <5/10 to demonstrate improvement management of condition Baseline: 9/10 Goal status: ongoing  7/17 (decreased to 7-8/10 ); 11/19/22 (9/10)  5.  Pt will improve on Berg balance test to >/= 50/56 to demonstrate a decrease in fall risk.  Baseline: 35/56 Goal status: in progress 08/28/22 (47/56); 11/19/22 (41/56)  6.  Pt will increase strength of hip by 1 full grade to improve functional mobility. Baseline: see chart Goal status: deferred (changes measurement tool) 5/24/247/17  7.Pt will improve strength in all areas listed by 10lb to demonstrate improved overall physical function  Baseline: see chart  Goal Status: In PROGRESS (met for knees, but not for hips) 7/17; 11/19/22  8.  Pt will improve on 5 X  STS test to <or=   18s to demonstrate improving functional lower extremity strength, transitional movements, and balance Baseline:20.03 Goal status:New   PLAN:  PT FREQUENCY: 1-2x week  PT DURATION: 8 weeks 10 visits.    PLANNED INTERVENTIONS: Therapeutic exercises, Therapeutic activity, Neuromuscular re-education, Balance training, Gait training, Patient/Family education, Self Care, Joint mobilization, Stair training, DME instructions, Aquatic Therapy, Dry Needling, Cryotherapy, Moist heat, Taping, Ultrasound, Biofeedback, Ionotophoresis 4mg /ml Dexamethasone, Manual therapy, and Re-evaluation.  PLAN FOR NEXT SESSION: Alternate land and aquatics: general strengthening; ROM; stair climbing; Add/modify HEP; endurance training. 5 visits land 5 visits aquatics   Corrie Dandy [place]) Quayshawn Nin MPT 11/19/22 6:24 PM [facility] [phone]  [place], [place], [address] Phone: [phone]   Fax:  [phone]

## 2022-11-20 ENCOUNTER — Encounter: Payer: Self-pay | Admitting: Physician Assistant

## 2022-11-20 NOTE — Telephone Encounter (Signed)
Message acknowledged and reviewed.

## 2022-11-21 ENCOUNTER — Other Ambulatory Visit (HOSPITAL_COMMUNITY): Payer: Self-pay | Admitting: Physician Assistant

## 2022-11-21 DIAGNOSIS — F988 Other specified behavioral and emotional disorders with onset usually occurring in childhood and adolescence: Secondary | ICD-10-CM

## 2022-11-21 MED ORDER — METHYLPHENIDATE HCL 5 MG PO TABS: 5.0000 mg | ORAL_TABLET | Freq: Two times a day (BID) | ORAL | 0 refills | Status: DC

## 2022-11-21 NOTE — Progress Notes (Signed)
Provider was able to reach out to patient to discuss ADHD medication options.  Patient reports that she was prescribed Adderall in the past but states that the medication was ineffective in managing her symptoms.  Provider discussed placing patient on Ritalin 5 mg 2 times daily for the management of her ADHD symptoms.  Provider was able to reach out to patient's cardiologist (Dr. [name]) to discuss whether or not he had concerns for patient being placed on Ritalin.  Dr. [name] approved patient being placed on Ritalin 5 mg 2 times daily for the management of her ADHD symptoms.  Patient's medication to be e-prescribed to pharmacy of choice.

## 2022-11-26 ENCOUNTER — Ambulatory Visit (INDEPENDENT_AMBULATORY_CARE_PROVIDER_SITE_OTHER): Payer: Medicare Other | Admitting: Podiatry

## 2022-11-26 DIAGNOSIS — S90122A Contusion of left lesser toe(s) without damage to nail, initial encounter: Secondary | ICD-10-CM | POA: Diagnosis not present

## 2022-11-26 DIAGNOSIS — B353 Tinea pedis: Secondary | ICD-10-CM

## 2022-11-26 DIAGNOSIS — B359 Dermatophytosis, unspecified: Secondary | ICD-10-CM | POA: Diagnosis not present

## 2022-11-26 NOTE — Progress Notes (Signed)
Chief Complaint  Patient presents with   Nail Problem    LEFT 3RD NAIL IS BLACK, DENIES INJURY SKIN PEELING BILAT R>L  DIABETIC    HPI: 57 y.o. female presents today with 2 concerns:  1) She has a discolored toenail left third toe.  Minimal pain with it but the whole nail is discolored.   She is not sure of any injury. 2) She has peeling skin on the plantar aspect of right foot.  No blister formation.  Does not provide into the interspaces.  No blistering.   Past Medical History:  Diagnosis Date   ADD (attention deficit disorder)    Anxiety    Arthritis    both knees right worse than left   Carpal tunnel syndrome of right wrist    Depression    Essential hypertension, benign    Gallstones    History of smoking 06/22/2016   Hyperlipidemia associated with type 2 diabetes mellitus (HCC), on Zocor 04/04/2011   Hypertension associated with diabetes (HCC) 06/03/2008   Migraines    Morbid obesity (HCC)    Morbid obesity with BMI of 50.0-59.9, adult (HCC) 07/12/2006   NICM (nonischemic cardiomyopathy) (HCC) 07/12/2006   01/16/18 ECHO:    - Procedure narrative: Transthoracic echocardiography. Image   quality was suboptimal. The study was technically difficult.   Intravenous contrast (Definity) was administered. - Left ventricle: The cavity size was moderately dilated. Wall   thickness was increased in a pattern of mild LVH. Systolic   function was moderately to severely reduced. The estimated   ejection fraction w   Normal coronary arteries 04/17/2016   OSA on CPAP 10/28/2014   Spinal stenosis    back pain   Spondylosis without myelopathy or radiculopathy, lumbar region 12/04/2017   Type 2 diabetes mellitus with hyperglycemia [facility])     Past Surgical History:  Procedure Laterality Date   CHOLECYSTECTOMY     DILATATION & CURETTAGE/HYSTEROSCOPY WITH MYOSURE N/A 10/31/2017   Procedure: DILATATION & CURETTAGE/HYSTEROSCOPY WITH MYOSURE;  Surgeon: Romualdo Bolk, MD;  Location: [facility];   Service: Gynecology;  Laterality: N/A;   HYSTEROSCOPY WITH D & C N/A 05/07/2022   Procedure: DILATATION AND CURETTAGE /HYSTEROSCOPY;  Surgeon: [name], MD;  Location: [facility] OR;  Service: Gynecology;  Laterality: N/A;   RIGHT/LEFT HEART CATH AND CORONARY ANGIOGRAPHY N/A 04/11/2016   Procedure: Right/Left Heart Cath and Coronary Angiography;  Surgeon: Kathleene Hazel, MD;  Location: [facility] INVASIVE CV LAB;  Service: Cardiovascular;  Laterality: N/A;   sonogram for blood clots     no blockages    Allergies  Allergen Reactions   Fluoxetine Other (See Comments)    More depressed   Cymbalta [Duloxetine Hcl] Other (See Comments)    depressed   Pork-Derived Products    Penicillins Rash    Has patient had a PCN reaction causing immediate rash, facial/tongue/throat swelling, SOB or lightheadedness with hypotension: YES Has patient had a PCN reaction causing severe rash involving mucus membranes or skin necrosis: NO Has patient had a PCN reaction that required hospitalization: YES Has patient had a PCN reaction occurring within the last 10 years: NO If all of the above answers are "NO", then may proceed with Cephalosporin use.     Physical Exam: General: The patient is alert and oriented x3 in no acute distress.  Dermatology: Skin is warm, dry and supple bilateral lower extremities. Interspaces are clear of maceration and debris.  There is some peeling skin noted on the plantar aspect  of the right foot only and extending up the side margins in the midfoot and rear foot.  No erythema or blister formation is noted.  The left third toenail is ecchymotic subungually.  The nail is well adhered to the skin.  No fluctuance is noted.  No pain on palpation.  Vascular: Palpable pedal pulses bilaterally. Capillary refill within normal limits.  No appreciable edema.  No erythema or calor.  Neurological: Light touch sensation grossly intact bilateral feet.   Musculoskeletal Exam: No pain on  palpation to the left third toe  Assessment/Plan of Care: 1. Tinea pedis of right foot   2. Contusion of third toe of left foot, initial encounter     Discussed clinical findings with patient today.  Informed the patient that the bruised toenail will just have to grow out and then can take up to a year.  The nail make it brittle and as it starts to grow out and the first portion starts to flake away.  Skin samples of the peeling skin were obtained of the plantar aspect of the right foot and sent to [facility] laboratories for fungal culture.  Will call the patient to review results in a few weeks when the results come in and get her started on medication for treatment.   Clerance Lav, DPM, FACFAS [facility]     [address] [place], [place] [address]                Office [phone]  Fax [phone]

## 2022-11-29 ENCOUNTER — Ambulatory Visit (HOSPITAL_BASED_OUTPATIENT_CLINIC_OR_DEPARTMENT_OTHER): Payer: Medicare Other | Attending: Family Medicine | Admitting: Physical Therapy

## 2022-11-29 DIAGNOSIS — M5459 Other low back pain: Secondary | ICD-10-CM | POA: Insufficient documentation

## 2022-11-29 DIAGNOSIS — G8929 Other chronic pain: Secondary | ICD-10-CM | POA: Insufficient documentation

## 2022-11-29 DIAGNOSIS — M25561 Pain in right knee: Secondary | ICD-10-CM | POA: Insufficient documentation

## 2022-11-29 DIAGNOSIS — M6281 Muscle weakness (generalized): Secondary | ICD-10-CM | POA: Insufficient documentation

## 2022-11-29 NOTE — Therapy (Addendum)
OUTPATIENT PHYSICAL THERAPY THORACOLUMBAR TREATMENT PHYSICAL THERAPY DISCHARGE SUMMARY  Visits from Start of Care: 29  Current functional level related to goals / functional outcomes: unknown   Remaining deficits: unknown   Education / Equipment: Management of condition/ HEP   Patient agrees to discharge. Patient goals were partially met. Patient is being discharged due to  Pt hospitalized then did not return.  Addend Corrie Dandy Tomma Lightning) Ziemba MPT 03/26/23 9:33 AM [facility] [address] [place], [place], [address] Phone: [phone]   Fax:  [phone]    Patient Name: [name] MRN: [id number] DOB:[birth year], 57 y.o., female Today's Date: 11/29/2022  END OF SESSION:  PT End of Session - 11/29/22 1044     Visit Number 29    Number of Visits 38    Date for PT Re-Evaluation 01/14/23    Authorization Type MCR    Progress Note Due on Visit 38    PT Start Time 1032    PT Stop Time 1110    PT Time Calculation (min) 38 min    Activity Tolerance Patient tolerated treatment well    Behavior During Therapy WFL for tasks assessed/performed             Past Medical History:  Diagnosis Date   ADD (attention deficit disorder)    Anxiety    Arthritis    both knees right worse than left   Carpal tunnel syndrome of right wrist    Depression    Essential hypertension, benign    Gallstones    History of smoking 06/22/2016   Hyperlipidemia associated with type 2 diabetes mellitus (HCC), on Zocor 04/04/2011   Hypertension associated with diabetes (HCC) 06/03/2008   Migraines    Morbid obesity (HCC)    Morbid obesity with BMI of 50.0-59.9, adult (HCC) 07/12/2006   NICM (nonischemic cardiomyopathy) (HCC) 07/12/2006   01/16/18 ECHO:    - Procedure narrative: Transthoracic echocardiography. Image   quality was suboptimal. The study was technically difficult.   Intravenous contrast (Definity) was administered. - Left  ventricle: The cavity size was moderately dilated. Wall   thickness was increased in a pattern of mild LVH. Systolic   function was moderately to severely reduced. The estimated   ejection fraction w   Normal coronary arteries 04/17/2016   OSA on CPAP 10/28/2014   Spinal stenosis    back pain   Spondylosis without myelopathy or radiculopathy, lumbar region 12/04/2017   Type 2 diabetes mellitus with hyperglycemia [facility])    Past Surgical History:  Procedure Laterality Date   CHOLECYSTECTOMY     DILATATION & CURETTAGE/HYSTEROSCOPY WITH MYOSURE N/A 10/31/2017   Procedure: DILATATION & CURETTAGE/HYSTEROSCOPY WITH MYOSURE;  Surgeon: Romualdo Bolk, MD;  Location: [facility];  Service: Gynecology;  Laterality: N/A;   HYSTEROSCOPY WITH D & C N/A 05/07/2022   Procedure: DILATATION AND CURETTAGE /HYSTEROSCOPY;  Surgeon: [name], MD;  Location: [facility] OR;  Service: Gynecology;  Laterality: N/A;   RIGHT/LEFT HEART CATH AND CORONARY ANGIOGRAPHY N/A 04/11/2016   Procedure: Right/Left Heart Cath and Coronary Angiography;  Surgeon: Kathleene Hazel, MD;  Location: [facility] INVASIVE CV LAB;  Service: Cardiovascular;  Laterality: N/A;   sonogram for blood clots     no blockages   Patient Active Problem List   Diagnosis Date Noted   Dysuria 11/15/2022   Onychomycosis 11/15/2022   Near syncope 11/15/2022   Anxiety 10/27/2022   Rosacea, acne 08/13/2022   BRBPR (bright red blood per rectum)  08/13/2022   Vaginal yeast infection 03/06/2022   Primary osteoarthritis of both knees 03/06/2022   Bilateral ovarian cysts 03/06/2022   Major depressive disorder, recurrent episode, severe with anxious distress (HCC) 08/09/2021   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   Need for varicella vaccine 08/09/2021   Urge incontinence 08/01/2021   Tinea corporis 03/14/2021   Hyperlipidemia LDL goal <100 03/01/2021   DDD (degenerative disc disease), lumbar 08/08/2020   Non-seasonal allergic  rhinitis due to pollen 07/25/2020   Chronic idiopathic constipation 07/25/2020   Visit for screening mammogram 02/11/2020   Chronic bilateral low back pain without sciatica 02/10/2020   Spondylosis without myelopathy or radiculopathy, lumbar region 12/04/2017   Type 2 diabetes mellitus with hyperglycemia (HCC)    Normal coronary arteries 04/17/2016   OSA on CPAP 10/28/2014   Chronic systolic CHF (congestive heart failure), NYHA class 3 (HCC) 12/06/2013   Hyperlipidemia associated with type 2 diabetes mellitus (HCC), on Zocor 04/04/2011   Hypertension associated with diabetes (HCC) 06/03/2008   Morbid obesity with BMI of 50.0-59.9, adult (HCC) 07/12/2006   NICM (nonischemic cardiomyopathy) (HCC) 07/12/2006   Migraine without status migrainosus, not intractable 07/12/2006    PCP: [name], MD   REFERRING PROVIDER: Rodolph Bong, MD   REFERRING DIAG:   Chronic pain of right knee  M54.50,G89.29 (ICD-10-CM) - Chronic bilateral low back pain without sciatica    Rationale for Evaluation and Treatment: Rehabilitation  THERAPY DIAG:  Other low back pain  Muscle weakness (generalized)  Chronic pain of right knee  ONSET DATE: >5  SUBJECTIVE:                                                                                                                                                                                           SUBJECTIVE STATEMENT: Pt reports no new changes since last PT appt.  She reports that she has a family membership to [facility], but hasn't gone in a while.       PERTINENT HISTORY:  [name] 03/19/22: 57 y.o. female with chronic low back pain and chronic right knee pain  . . . bothersome paresthesias sensation bilateral lower extremities.  . . . Right knee pain due to exacerbation of DJD.  . . . Chronic back pain thought to be due to core weakness and facet arthritis changes seen on CT scan.   PAIN:  Are you having pain? Yes: NPRS scale: 8/10 Pain  location: LB to bilat hips, Rt knee  Pain description: ache, pressure Aggravating factors: standing and moving, walking through home Relieving factors: sitting  PRECAUTIONS: Knee and Back  WEIGHT  BEARING RESTRICTIONS: No  FALLS:  Has patient fallen in last 6 months? No  LIVING ENVIRONMENT: Lives with: lives with their family and lives with their spouse Lives in: House/apartment Stairs: Yes: Internal: 16 steps; on right going up Has following equipment at home: Walker - 4 wheeled and shower chair  OCCUPATION: disabled  PLOF: Needs assistance with ADLs, Needs assistance with homemaking, and Needs assistance with gait  PATIENT GOALS: increased movement without pain, ADL's, cook, climb stairs, go to the store without hurting  NEXT MD VISIT: 6 weeks  OBJECTIVE:   DIAGNOSTIC FINDINGS:  X-ray images right knee: Mild medial compartment DJD.  Mild patellofemoral DJD  CT scan images of lumbar spine: Diffuse degenerative changes present throughout lumbar spine   PATIENT SURVEYS:  FOTO Primary score 22% with goal 39% 06/28/22: 22% 08/07/22: Lumbar: 16%, knee: 29% 7/17: Lumbar: 44%, knee: 40%  SCREENING FOR RED FLAGS: none  COGNITION: Overall cognitive status: Within functional limits for tasks assessed     SENSATION: wfl  POSTURE: increased thoracic kyphosis,right shoulder elevated  PALPATION: No tenderness although difficulty to assess due to body habitus  LUMBAR ROM:   AROM eval 06/28/22 07/19/22 09/13/22 11/19/22  Flexion Mid shin   full full  Extension 0 10d wfl  wfl  Right lateral flexion 50% 75% full  75%  Left lateral flexion 50% 75% full  full  Right rotation 100%      Left rotation 100%       (Blank rows = not tested)  LOWER EXTREMITY ROM:      Limited due to body habitus  LOWER EXTREMITY MMT:    MMT Right eval Left eval 06/28/22 Right / left 07/19/22 Right / Left 08/21/22 Right/left 7/17 Right / Left  11/19/22 Right / Left  Hip flexion 3+ 3+ 4-/5 35.6  / 41.7 42.3/42.2 50.6/49.2 45.8 / 40.7  Hip extension         Hip abduction    35.9 / 33/9 35.7/37.0 41.1/40.6 36.4 / 30.3  Hip adduction         Hip internal rotation         Hip external rotation         Knee flexion 4- 4- 4+ / 4+  24.7/34.0 36.2/44.2   Knee extension 4+ 4+ 5- / 5- 25.6 / 30.1 31.0/36.3 33.2/36.0 27.8 / 24.2  Ankle dorsiflexion         Ankle plantarflexion         Ankle inversion         Ankle eversion          (Blank rows = not tested)  LUMBAR SPECIAL TESTS:  Slump test: Negative  FUNCTIONAL TESTS:  5 times sit to stand: 24.16 seated on scooter Timed up and go (TUG): 20.90 from seated on scooter Berg: 35/56  06/21/22:  5x STS:  18.40 seated on scooter    TUG 12.91 from seated on scooter  06/28/22: Berg: 40/56    07/19/22 TUG: 14.85      5 x STS: 17.11  7/3: Berg 47/56 7/19: TUG 15.67           5 x STS: 16.45  11/19/22:  Berg:41/56  TUG:15.68  5 x STS: 20.03  GAIT: 09/13/22 Distance walked: 500 ft Assistive device utilized: None Level of assistance:  none Comments: increased sway R/L, becomes more exaggerated with fatigue  TODAY'S TREATMENT:       11/29/22  Pt seen for aquatic therapy today.  Treatment took place in  water 3.5-4.75 ft in depth at the [facility] pool. Temp of water was 91.  Pt entered/exited the pool via stairs in step-to pattern with hand rail.  * without support:  walking forward/ backward with reciprocal arm swing *side step with shoulder add/abd with rainbow hand floats at 4 ft; walking forward / backward with rainbow hand floats under water swinging at sides * UE with rainbow hand floats at surface:  3 way LE kick x 5 each LE;  heel raises x 10  * wall push up/off x 10 * UE on yellow hand floats:  front to back pendulums x 6 with cues and demo for improved technique, side to side pendulums x 4 * return to walking forward/ backward without/ with hand floats under water   7/42/59 Re-cert TUG 5 x  STS Berg  09/13/22 Re-assess TUG 5 x STS  Self care: Pt instruction on completion of HEP; importance and benefits.  Chronic condition which requires daily exercises/stretching for proper management. Pain management; mental wellness    PATIENT EDUCATION:  Education details: Aquatic therapy progressions/ modifications Person educated: Patient Education method: Explanation Education comprehension: verbalized understanding  HOME EXERCISE PROGRAM:  Access Code: [email or web address] URL: [email or web address] Date: 09/11/2022 Prepared by: Riki Altes   Aquatic Access Code: [email or web address] URL: [email or web address] Date: 09/06/2022 Prepared by: [facility] This aquatic home exercise program from [employer] utilizes pictures from land based exercises, but has been adapted prior to lamination and issuance.    ASSESSMENT:  CLINICAL IMPRESSION: Reviewed previously performed aquatic exercises with good tolerance.  Pt reported overall reduction in generalized pain while exercising in the water.  Encouraged pt to attempt 1 visit to the pool at [facility] prior to next aquatic appt, to build routine and continue having pain relief while in the water.  Pt will benefit from skilled PT intervention to maximize functional mobility with less pain.   Per PT- Plan to alternate land and aquatics to slowly re-introduce her back to exercise.  She reports she has a hard time with motivation.    OBJECTIVE IMPAIRMENTS: decreased activity tolerance, decreased balance, decreased endurance, decreased knowledge of use of DME, decreased mobility, difficulty walking, decreased strength, impaired sensation, postural dysfunction, obesity, and pain.   ACTIVITY LIMITATIONS: carrying, lifting, bending, sitting, standing, squatting, stairs, transfers, bed mobility, locomotion level, and caring for others  PARTICIPATION LIMITATIONS: meal prep, cleaning, laundry, shopping,  community activity, and occupation  PERSONAL FACTORS: Age, Behavior pattern, Fitness, Time since onset of injury/illness/exacerbation, and 1-2 comorbidities: cardiomyopathy, Oa knees and back, depression/anxiety  are also affecting patient's functional outcome.   REHAB POTENTIAL: Good  CLINICAL DECISION MAKING: Evolving/moderate complexity  EVALUATION COMPLEXITY: Moderate   GOALS: Goals reviewed with patient? Yes  SHORT TERM GOALS: Target date: 06/24/22  Pt will tolerate full aquatic sessions consistently without increase in pain and with improving function to demonstrate good toleration and effectiveness of intervention.   Baseline:TBA Goal status:MET - 06/14/22  2.  Pt will climb stairs using combination of step to and alternating pattern without pausing consistently Baseline: improving - some pauses, but not consistent - 06/19/22 Goal status:ONGOING (still pauses); 11/19/22  3.  Pt will report climbing stairs at home 1-2 x weekly to access bedroom and full bath more regularly. Baseline: <1x weekly Pt reports she has been showering at therapy and has not been climbing stairs Goal status: ongoing 09/11/22; Met 09/13/22  4.  Pt will tolerate walking 8-10 continuous minutes in pool to demonstrate improving toleration to  activity Baseline: na Goal status: MET - 06/14/22  5.  Pt will improve on 5 X STS test to <or=   18s to demonstrate improving functional lower extremity strength, transitional movements, and balance Baseline: 24.16 at eval;  18.4s on 06/21/22 Goal status: MET 07/19/22 (17.11)  6.  Pt will improve on Tug test to <or= 14s to demonstrate improvement in lower extremity function, mobility and decreased fall risk.  Baseline:  20.90 at eval;  12.91 on 06/21/22 Goal status: MET   LONG TERM GOALS: Target date: 01/14/23  Pt to meet stated Foto Goal of 39% to demonstrate improved perception of ability Baseline: 22% Goal status: MET 7/17  2.  Pt will be indep with final HEP's  (land and aquatic as appropriate) for continued management of condition  Baseline:  Goal status: in process 7/17; 11/19/22  3.  Pt will walk in store for up to or >15 mins pushing cart Baseline: rides scooter Goal status: Ongoing 7/17- still uses scooter; 11/19/22  4.  Pt will report decrease in pain with amb to <5/10 to demonstrate improvement management of condition Baseline: 9/10 Goal status: ongoing  7/17 (decreased to 7-8/10 ); 11/19/22 (9/10)  5.  Pt will improve on Berg balance test to >/= 50/56 to demonstrate a decrease in fall risk.  Baseline: 35/56 Goal status: in progress 08/28/22 (47/56); 11/19/22 (41/56)  6.  Pt will increase strength of hip by 1 full grade to improve functional mobility. Baseline: see chart Goal status: deferred (changes measurement tool) 5/24/247/17  7.Pt will improve strength in all areas listed by 10lb to demonstrate improved overall physical function  Baseline: see chart  Goal Status: In PROGRESS (met for knees, but not for hips) 7/17; 11/19/22  8.  Pt will improve on 5 X STS test to <or=   18s to demonstrate improving functional lower extremity strength, transitional movements, and balance Baseline:20.03 Goal status:New   PLAN:  PT FREQUENCY: 1-2x week  PT DURATION: 8 weeks 10 visits.    PLANNED INTERVENTIONS: Therapeutic exercises, Therapeutic activity, Neuromuscular re-education, Balance training, Gait training, Patient/Family education, Self Care, Joint mobilization, Stair training, DME instructions, Aquatic Therapy, Dry Needling, Cryotherapy, Moist heat, Taping, Ultrasound, Biofeedback, Ionotophoresis 4mg /ml Dexamethasone, Manual therapy, and Re-evaluation.  PLAN FOR NEXT SESSION: Alternate land and aquatics: general strengthening; ROM; stair climbing; Add/modify HEP; endurance training. 5 visits land 5 visits aquatics  Mayer Camel, [place] 11/29/22 12:48 PM [facility] [address] [place], [place], [address] Phone: [phone]   Fax:  [phone]

## 2022-12-05 ENCOUNTER — Ambulatory Visit (HOSPITAL_COMMUNITY): Payer: Medicare Other | Admitting: Physician Assistant

## 2022-12-05 ENCOUNTER — Emergency Department (HOSPITAL_COMMUNITY): Payer: Medicare Other

## 2022-12-05 ENCOUNTER — Inpatient Hospital Stay (HOSPITAL_COMMUNITY)
Admission: EM | Admit: 2022-12-05 | Discharge: 2022-12-17 | DRG: 286 | Disposition: A | Payer: Medicare Other | Attending: Internal Medicine | Admitting: Internal Medicine

## 2022-12-05 ENCOUNTER — Encounter (HOSPITAL_COMMUNITY): Payer: Self-pay | Admitting: Physician Assistant

## 2022-12-05 ENCOUNTER — Other Ambulatory Visit: Payer: Self-pay

## 2022-12-05 DIAGNOSIS — Z888 Allergy status to other drugs, medicaments and biological substances status: Secondary | ICD-10-CM

## 2022-12-05 DIAGNOSIS — R0989 Other specified symptoms and signs involving the circulatory and respiratory systems: Secondary | ICD-10-CM | POA: Diagnosis not present

## 2022-12-05 DIAGNOSIS — I152 Hypertension secondary to endocrine disorders: Secondary | ICD-10-CM | POA: Diagnosis present

## 2022-12-05 DIAGNOSIS — J9811 Atelectasis: Secondary | ICD-10-CM | POA: Diagnosis not present

## 2022-12-05 DIAGNOSIS — N179 Acute kidney failure, unspecified: Secondary | ICD-10-CM | POA: Diagnosis present

## 2022-12-05 DIAGNOSIS — Z79899 Other long term (current) drug therapy: Secondary | ICD-10-CM

## 2022-12-05 DIAGNOSIS — R739 Hyperglycemia, unspecified: Secondary | ICD-10-CM

## 2022-12-05 DIAGNOSIS — Z8379 Family history of other diseases of the digestive system: Secondary | ICD-10-CM

## 2022-12-05 DIAGNOSIS — Z9049 Acquired absence of other specified parts of digestive tract: Secondary | ICD-10-CM

## 2022-12-05 DIAGNOSIS — Z8249 Family history of ischemic heart disease and other diseases of the circulatory system: Secondary | ICD-10-CM | POA: Diagnosis not present

## 2022-12-05 DIAGNOSIS — J9601 Acute respiratory failure with hypoxia: Secondary | ICD-10-CM | POA: Diagnosis not present

## 2022-12-05 DIAGNOSIS — Y848 Other medical procedures as the cause of abnormal reaction of the patient, or of later complication, without mention of misadventure at the time of the procedure: Secondary | ICD-10-CM | POA: Diagnosis present

## 2022-12-05 DIAGNOSIS — F419 Anxiety disorder, unspecified: Secondary | ICD-10-CM | POA: Diagnosis present

## 2022-12-05 DIAGNOSIS — Z833 Family history of diabetes mellitus: Secondary | ICD-10-CM

## 2022-12-05 DIAGNOSIS — R5381 Other malaise: Secondary | ICD-10-CM | POA: Diagnosis not present

## 2022-12-05 DIAGNOSIS — Z6841 Body Mass Index (BMI) 40.0 and over, adult: Secondary | ICD-10-CM

## 2022-12-05 DIAGNOSIS — Z87891 Personal history of nicotine dependence: Secondary | ICD-10-CM | POA: Diagnosis not present

## 2022-12-05 DIAGNOSIS — F332 Major depressive disorder, recurrent severe without psychotic features: Secondary | ICD-10-CM | POA: Diagnosis not present

## 2022-12-05 DIAGNOSIS — I462 Cardiac arrest due to underlying cardiac condition: Secondary | ICD-10-CM | POA: Diagnosis present

## 2022-12-05 DIAGNOSIS — J9602 Acute respiratory failure with hypercapnia: Secondary | ICD-10-CM | POA: Diagnosis not present

## 2022-12-05 DIAGNOSIS — I472 Ventricular tachycardia, unspecified: Secondary | ICD-10-CM | POA: Diagnosis present

## 2022-12-05 DIAGNOSIS — S81802D Unspecified open wound, left lower leg, subsequent encounter: Secondary | ICD-10-CM | POA: Diagnosis not present

## 2022-12-05 DIAGNOSIS — F909 Attention-deficit hyperactivity disorder, unspecified type: Secondary | ICD-10-CM | POA: Diagnosis not present

## 2022-12-05 DIAGNOSIS — Z4682 Encounter for fitting and adjustment of non-vascular catheter: Secondary | ICD-10-CM | POA: Diagnosis not present

## 2022-12-05 DIAGNOSIS — I5020 Unspecified systolic (congestive) heart failure: Secondary | ICD-10-CM | POA: Diagnosis not present

## 2022-12-05 DIAGNOSIS — I517 Cardiomegaly: Secondary | ICD-10-CM | POA: Diagnosis not present

## 2022-12-05 DIAGNOSIS — S70322A Blister (nonthermal), left thigh, initial encounter: Secondary | ICD-10-CM | POA: Diagnosis present

## 2022-12-05 DIAGNOSIS — E785 Hyperlipidemia, unspecified: Secondary | ICD-10-CM | POA: Diagnosis present

## 2022-12-05 DIAGNOSIS — Z7984 Long term (current) use of oral hypoglycemic drugs: Secondary | ICD-10-CM

## 2022-12-05 DIAGNOSIS — Z452 Encounter for adjustment and management of vascular access device: Secondary | ICD-10-CM | POA: Diagnosis not present

## 2022-12-05 DIAGNOSIS — S0081XA Abrasion of other part of head, initial encounter: Secondary | ICD-10-CM | POA: Diagnosis present

## 2022-12-05 DIAGNOSIS — M79662 Pain in left lower leg: Secondary | ICD-10-CM | POA: Diagnosis not present

## 2022-12-05 DIAGNOSIS — D751 Secondary polycythemia: Secondary | ICD-10-CM | POA: Diagnosis present

## 2022-12-05 DIAGNOSIS — R079 Chest pain, unspecified: Secondary | ICD-10-CM | POA: Diagnosis not present

## 2022-12-05 DIAGNOSIS — E874 Mixed disorder of acid-base balance: Secondary | ICD-10-CM | POA: Diagnosis present

## 2022-12-05 DIAGNOSIS — I5021 Acute systolic (congestive) heart failure: Secondary | ICD-10-CM

## 2022-12-05 DIAGNOSIS — K7200 Acute and subacute hepatic failure without coma: Secondary | ICD-10-CM | POA: Diagnosis not present

## 2022-12-05 DIAGNOSIS — I5042 Chronic combined systolic (congestive) and diastolic (congestive) heart failure: Secondary | ICD-10-CM | POA: Diagnosis present

## 2022-12-05 DIAGNOSIS — E1122 Type 2 diabetes mellitus with diabetic chronic kidney disease: Secondary | ICD-10-CM | POA: Diagnosis present

## 2022-12-05 DIAGNOSIS — E1169 Type 2 diabetes mellitus with other specified complication: Secondary | ICD-10-CM | POA: Diagnosis present

## 2022-12-05 DIAGNOSIS — E872 Acidosis, unspecified: Secondary | ICD-10-CM | POA: Diagnosis not present

## 2022-12-05 DIAGNOSIS — I4901 Ventricular fibrillation: Principal | ICD-10-CM | POA: Diagnosis present

## 2022-12-05 DIAGNOSIS — Z7985 Long-term (current) use of injectable non-insulin antidiabetic drugs: Secondary | ICD-10-CM

## 2022-12-05 DIAGNOSIS — I420 Dilated cardiomyopathy: Secondary | ICD-10-CM | POA: Diagnosis present

## 2022-12-05 DIAGNOSIS — J690 Pneumonitis due to inhalation of food and vomit: Secondary | ICD-10-CM | POA: Diagnosis not present

## 2022-12-05 DIAGNOSIS — R578 Other shock: Secondary | ICD-10-CM | POA: Diagnosis present

## 2022-12-05 DIAGNOSIS — Z818 Family history of other mental and behavioral disorders: Secondary | ICD-10-CM | POA: Diagnosis not present

## 2022-12-05 DIAGNOSIS — T8189XA Other complications of procedures, not elsewhere classified, initial encounter: Secondary | ICD-10-CM | POA: Diagnosis present

## 2022-12-05 DIAGNOSIS — E1152 Type 2 diabetes mellitus with diabetic peripheral angiopathy with gangrene: Secondary | ICD-10-CM | POA: Diagnosis present

## 2022-12-05 DIAGNOSIS — L97928 Non-pressure chronic ulcer of unspecified part of left lower leg with other specified severity: Secondary | ICD-10-CM | POA: Diagnosis not present

## 2022-12-05 DIAGNOSIS — I428 Other cardiomyopathies: Secondary | ICD-10-CM | POA: Diagnosis not present

## 2022-12-05 DIAGNOSIS — I4891 Unspecified atrial fibrillation: Secondary | ICD-10-CM | POA: Diagnosis present

## 2022-12-05 DIAGNOSIS — I493 Ventricular premature depolarization: Secondary | ICD-10-CM | POA: Diagnosis present

## 2022-12-05 DIAGNOSIS — R140 Abdominal distension (gaseous): Secondary | ICD-10-CM | POA: Diagnosis not present

## 2022-12-05 DIAGNOSIS — R404 Transient alteration of awareness: Secondary | ICD-10-CM | POA: Diagnosis not present

## 2022-12-05 DIAGNOSIS — X58XXXA Exposure to other specified factors, initial encounter: Secondary | ICD-10-CM | POA: Diagnosis present

## 2022-12-05 DIAGNOSIS — B3731 Acute candidiasis of vulva and vagina: Secondary | ICD-10-CM | POA: Diagnosis not present

## 2022-12-05 DIAGNOSIS — G4733 Obstructive sleep apnea (adult) (pediatric): Secondary | ICD-10-CM | POA: Diagnosis not present

## 2022-12-05 DIAGNOSIS — N1831 Chronic kidney disease, stage 3a: Secondary | ICD-10-CM | POA: Diagnosis present

## 2022-12-05 DIAGNOSIS — I469 Cardiac arrest, cause unspecified: Principal | ICD-10-CM | POA: Diagnosis present

## 2022-12-05 DIAGNOSIS — S81802A Unspecified open wound, left lower leg, initial encounter: Secondary | ICD-10-CM | POA: Diagnosis not present

## 2022-12-05 DIAGNOSIS — G43909 Migraine, unspecified, not intractable, without status migrainosus: Secondary | ICD-10-CM | POA: Diagnosis present

## 2022-12-05 DIAGNOSIS — E876 Hypokalemia: Secondary | ICD-10-CM | POA: Diagnosis not present

## 2022-12-05 DIAGNOSIS — I5022 Chronic systolic (congestive) heart failure: Secondary | ICD-10-CM | POA: Diagnosis present

## 2022-12-05 DIAGNOSIS — R4182 Altered mental status, unspecified: Secondary | ICD-10-CM | POA: Diagnosis not present

## 2022-12-05 DIAGNOSIS — I5043 Acute on chronic combined systolic (congestive) and diastolic (congestive) heart failure: Secondary | ICD-10-CM | POA: Diagnosis present

## 2022-12-05 DIAGNOSIS — F988 Other specified behavioral and emotional disorders with onset usually occurring in childhood and adolescence: Secondary | ICD-10-CM | POA: Diagnosis not present

## 2022-12-05 DIAGNOSIS — R45851 Suicidal ideations: Secondary | ICD-10-CM | POA: Diagnosis not present

## 2022-12-05 DIAGNOSIS — Z9889 Other specified postprocedural states: Secondary | ICD-10-CM

## 2022-12-05 DIAGNOSIS — Y828 Other medical devices associated with adverse incidents: Secondary | ICD-10-CM | POA: Diagnosis present

## 2022-12-05 DIAGNOSIS — R918 Other nonspecific abnormal finding of lung field: Secondary | ICD-10-CM | POA: Diagnosis not present

## 2022-12-05 DIAGNOSIS — M79604 Pain in right leg: Secondary | ICD-10-CM | POA: Diagnosis not present

## 2022-12-05 DIAGNOSIS — E1165 Type 2 diabetes mellitus with hyperglycemia: Secondary | ICD-10-CM | POA: Diagnosis present

## 2022-12-05 DIAGNOSIS — F32A Depression, unspecified: Secondary | ICD-10-CM | POA: Diagnosis present

## 2022-12-05 DIAGNOSIS — Z811 Family history of alcohol abuse and dependence: Secondary | ICD-10-CM

## 2022-12-05 DIAGNOSIS — Z880 Allergy status to penicillin: Secondary | ICD-10-CM

## 2022-12-05 DIAGNOSIS — G931 Anoxic brain damage, not elsewhere classified: Secondary | ICD-10-CM | POA: Diagnosis not present

## 2022-12-05 DIAGNOSIS — Z91014 Allergy to mammalian meats: Secondary | ICD-10-CM

## 2022-12-05 LAB — CBC
HCT: 51.7 % — ABNORMAL HIGH (ref 36.0–46.0)
Hemoglobin: 15.9 g/dL — ABNORMAL HIGH (ref 12.0–15.0)
MCH: 28.6 pg (ref 26.0–34.0)
MCHC: 30.8 g/dL (ref 30.0–36.0)
MCV: 93.2 fL (ref 80.0–100.0)
Platelets: 246 10*3/uL (ref 150–400)
RBC: 5.55 MIL/uL — ABNORMAL HIGH (ref 3.87–5.11)
RDW: 14.6 % (ref 11.5–15.5)
WBC: 15.8 10*3/uL — ABNORMAL HIGH (ref 4.0–10.5)
nRBC: 0 % (ref 0.0–0.2)

## 2022-12-05 LAB — I-STAT CHEM 8, ED
BUN: 21 mg/dL — ABNORMAL HIGH (ref 6–20)
Calcium, Ion: 1.11 mmol/L — ABNORMAL LOW (ref 1.15–1.40)
Chloride: 104 mmol/L (ref 98–111)
Creatinine, Ser: 1.1 mg/dL — ABNORMAL HIGH (ref 0.44–1.00)
Glucose, Bld: 285 mg/dL — ABNORMAL HIGH (ref 70–99)
HCT: 51 % — ABNORMAL HIGH (ref 36.0–46.0)
Hemoglobin: 17.3 g/dL — ABNORMAL HIGH (ref 12.0–15.0)
Potassium: 3.4 mmol/L — ABNORMAL LOW (ref 3.5–5.1)
Sodium: 140 mmol/L (ref 135–145)
TCO2: 19 mmol/L — ABNORMAL LOW (ref 22–32)

## 2022-12-05 LAB — I-STAT VENOUS BLOOD GAS, ED
Acid-base deficit: 15 mmol/L — ABNORMAL HIGH (ref 0.0–2.0)
Bicarbonate: 16.4 mmol/L — ABNORMAL LOW (ref 20.0–28.0)
Calcium, Ion: 1.09 mmol/L — ABNORMAL LOW (ref 1.15–1.40)
HCT: 50 % — ABNORMAL HIGH (ref 36.0–46.0)
Hemoglobin: 17 g/dL — ABNORMAL HIGH (ref 12.0–15.0)
O2 Saturation: 59 %
Potassium: 3.5 mmol/L (ref 3.5–5.1)
Sodium: 139 mmol/L (ref 135–145)
TCO2: 18 mmol/L — ABNORMAL LOW (ref 22–32)
pCO2, Ven: 59.4 mm[Hg] (ref 44–60)
pH, Ven: 7.049 — CL (ref 7.25–7.43)
pO2, Ven: 44 mm[Hg] (ref 32–45)

## 2022-12-05 LAB — I-STAT ARTERIAL BLOOD GAS, ED
Acid-base deficit: 7 mmol/L — ABNORMAL HIGH (ref 0.0–2.0)
Bicarbonate: 22.6 mmol/L (ref 20.0–28.0)
Calcium, Ion: 1.2 mmol/L (ref 1.15–1.40)
HCT: 48 % — ABNORMAL HIGH (ref 36.0–46.0)
Hemoglobin: 16.3 g/dL — ABNORMAL HIGH (ref 12.0–15.0)
O2 Saturation: 94 %
Patient temperature: 98.2
Potassium: 3.8 mmol/L (ref 3.5–5.1)
Sodium: 137 mmol/L (ref 135–145)
TCO2: 24 mmol/L (ref 22–32)
pCO2 arterial: 60.4 mm[Hg] — ABNORMAL HIGH (ref 32–48)
pH, Arterial: 7.18 — CL (ref 7.35–7.45)
pO2, Arterial: 91 mm[Hg] (ref 83–108)

## 2022-12-05 LAB — COMPREHENSIVE METABOLIC PANEL
ALT: 203 U/L — ABNORMAL HIGH (ref 0–44)
AST: 182 U/L — ABNORMAL HIGH (ref 15–41)
Albumin: 3.1 g/dL — ABNORMAL LOW (ref 3.5–5.0)
Alkaline Phosphatase: 42 U/L (ref 38–126)
Anion gap: 27 — ABNORMAL HIGH (ref 5–15)
BUN: 17 mg/dL (ref 6–20)
CO2: 15 mmol/L — ABNORMAL LOW (ref 22–32)
Calcium: 9.6 mg/dL (ref 8.9–10.3)
Chloride: 100 mmol/L (ref 98–111)
Creatinine, Ser: 1.34 mg/dL — ABNORMAL HIGH (ref 0.44–1.00)
GFR, Estimated: 46 mL/min — ABNORMAL LOW (ref >60)
Glucose, Bld: 289 mg/dL — ABNORMAL HIGH (ref 70–99)
Potassium: 3.7 mmol/L (ref 3.5–5.1)
Sodium: 142 mmol/L (ref 135–145)
Total Bilirubin: 0.9 mg/dL (ref 0.3–1.2)
Total Protein: 6.3 g/dL — ABNORMAL LOW (ref 6.5–8.1)

## 2022-12-05 LAB — LACTIC ACID, PLASMA: Lactic Acid, Venous: >9 mmol/L (ref 0.5–1.9)

## 2022-12-05 LAB — CBG MONITORING, ED
Glucose-Capillary: 270 mg/dL — ABNORMAL HIGH (ref 70–99)
Glucose-Capillary: 243 mg/dL — ABNORMAL HIGH (ref 70–99)

## 2022-12-05 LAB — I-STAT CG4 LACTIC ACID, ED: Lactic Acid, Venous: 13.7 mmol/L (ref 0.5–1.9)

## 2022-12-05 LAB — GLUCOSE, CAPILLARY: Glucose-Capillary: 218 mg/dL — ABNORMAL HIGH (ref 70–99)

## 2022-12-05 LAB — TROPONIN I (HIGH SENSITIVITY): Troponin I (High Sensitivity): 89 ng/L — ABNORMAL HIGH (ref <18)

## 2022-12-05 MED ORDER — CARIPRAZINE HCL 3 MG PO CAPS
3.0000 mg | ORAL_CAPSULE | Freq: Every day | ORAL | 1 refills | Status: DC
Start: 2022-12-05 — End: 2022-12-30

## 2022-12-05 MED ORDER — NOREPINEPHRINE 4 MG/250ML-% IV SOLN
2.0000 ug/min | INTRAVENOUS | Status: DC
  Administered 2022-12-05: 10 ug/min via INTRAVENOUS
  Administered 2022-12-05: 4 ug/min via INTRAVENOUS
  Filled 2022-12-05 (×2): qty 250

## 2022-12-05 MED ORDER — ACETAMINOPHEN 160 MG/5ML PO SOLN
650.0000 mg | ORAL | Status: DC | PRN
  Administered 2022-12-06: 650 mg

## 2022-12-05 MED ORDER — FENTANYL CITRATE PF 50 MCG/ML IJ SOSY
50.0000 ug | PREFILLED_SYRINGE | INTRAMUSCULAR | Status: DC | PRN
  Administered 2022-12-06 – 2022-12-07 (×3): 50 ug via INTRAVENOUS
  Filled 2022-12-05: qty 1
  Filled 2022-12-05: qty 4
  Filled 2022-12-05 (×2): qty 1

## 2022-12-05 MED ORDER — METHYLPHENIDATE HCL 5 MG PO TABS
5.0000 mg | ORAL_TABLET | Freq: Two times a day (BID) | ORAL | 0 refills | Status: DC
Start: 2022-12-05 — End: 2022-12-10

## 2022-12-05 MED ORDER — GABAPENTIN 100 MG PO CAPS
100.0000 mg | ORAL_CAPSULE | Freq: Three times a day (TID) | ORAL | 1 refills | Status: DC
Start: 2022-12-05 — End: 2022-12-27

## 2022-12-05 MED ORDER — FENTANYL CITRATE PF 50 MCG/ML IJ SOSY
50.0000 ug | PREFILLED_SYRINGE | INTRAMUSCULAR | Status: AC | PRN
  Administered 2022-12-05 – 2022-12-06 (×3): 50 ug via INTRAVENOUS
  Filled 2022-12-05 (×2): qty 1

## 2022-12-05 MED ORDER — ACETAMINOPHEN 650 MG RE SUPP: 650.0000 mg | RECTAL | Status: DC | PRN

## 2022-12-05 MED ORDER — POTASSIUM CHLORIDE 10 MEQ/100ML IV SOLN
10.0000 meq | INTRAVENOUS | Status: DC
  Administered 2022-12-05 – 2022-12-06 (×3): 10 meq via INTRAVENOUS
  Filled 2022-12-05 (×5): qty 100

## 2022-12-05 MED ORDER — SODIUM CHLORIDE 0.9% FLUSH
10.0000 mL | Freq: Two times a day (BID) | INTRAVENOUS | Status: DC
  Administered 2022-12-05 – 2022-12-17 (×17): 10 mL via INTRAVENOUS

## 2022-12-05 MED ORDER — FAMOTIDINE 20 MG PO TABS
20.0000 mg | ORAL_TABLET | Freq: Two times a day (BID) | ORAL | Status: DC
  Administered 2022-12-05 – 2022-12-07 (×4): 20 mg
  Filled 2022-12-05 (×4): qty 1

## 2022-12-05 MED ORDER — IOHEXOL 350 MG/ML SOLN
75.0000 mL | Freq: Once | INTRAVENOUS | Status: AC | PRN
  Administered 2022-12-05: 75 mL via INTRAVENOUS

## 2022-12-05 MED ORDER — ESCITALOPRAM OXALATE 10 MG PO TABS
10.0000 mg | ORAL_TABLET | Freq: Every day | ORAL | 1 refills | Status: DC
Start: 2022-12-05 — End: 2022-12-30

## 2022-12-05 MED ORDER — ETOMIDATE 2 MG/ML IV SOLN
INTRAVENOUS | Status: AC | PRN
  Administered 2022-12-05: 30 mg via INTRAVENOUS

## 2022-12-05 MED ORDER — PROPOFOL 1000 MG/100ML IV EMUL
5.0000 ug/kg/min | INTRAVENOUS | Status: DC
  Administered 2022-12-05: 5 ug/kg/min via INTRAVENOUS
  Administered 2022-12-05: 25 ug/kg/min via INTRAVENOUS
  Administered 2022-12-06: 20 ug/kg/min via INTRAVENOUS
  Filled 2022-12-05 (×3): qty 100

## 2022-12-05 MED ORDER — SODIUM CHLORIDE 0.9 % IV SOLN: 250.0000 mL | INTRAVENOUS | Status: DC

## 2022-12-05 MED ORDER — AMIODARONE HCL IN DEXTROSE 360-4.14 MG/200ML-% IV SOLN
30.0000 mg/h | INTRAVENOUS | Status: DC
  Administered 2022-12-06 – 2022-12-09 (×7): 30 mg/h via INTRAVENOUS
  Filled 2022-12-05 (×7): qty 200

## 2022-12-05 MED ORDER — INSULIN ASPART 100 UNIT/ML IJ SOLN
0.0000 [IU] | INTRAMUSCULAR | Status: DC
  Administered 2022-12-05 – 2022-12-06 (×2): 7 [IU] via SUBCUTANEOUS
  Administered 2022-12-06 (×3): 4 [IU] via SUBCUTANEOUS
  Administered 2022-12-06: 3 [IU] via SUBCUTANEOUS
  Administered 2022-12-06 – 2022-12-07 (×5): 4 [IU] via SUBCUTANEOUS
  Administered 2022-12-07 – 2022-12-09 (×5): 3 [IU] via SUBCUTANEOUS
  Administered 2022-12-10 (×2): 4 [IU] via SUBCUTANEOUS

## 2022-12-05 MED ORDER — AMIODARONE HCL IN DEXTROSE 360-4.14 MG/200ML-% IV SOLN
60.0000 mg/h | INTRAVENOUS | Status: DC
  Administered 2022-12-05 (×2): 60 mg/h via INTRAVENOUS
  Filled 2022-12-05: qty 200

## 2022-12-05 MED ORDER — ROCURONIUM BROMIDE 50 MG/5ML IV SOLN
INTRAVENOUS | Status: AC | PRN
  Administered 2022-12-05: 100 mg via INTRAVENOUS

## 2022-12-05 MED ORDER — ACETAMINOPHEN 325 MG PO TABS: 650.0000 mg | ORAL_TABLET | ORAL | Status: DC | PRN

## 2022-12-05 NOTE — Progress Notes (Addendum)
eLink Physician-Brief Progress Note Patient Name: [name] DOB: 1965/09/20 MRN: [id number]   Date of Service  12/05/2022  HPI/Events of Note  57 year old female with a history of nonischemic cardiomyopathy with heart failure with reduced ejection fraction of 30% that was brought in by out-of-hospital cardiac arrest with prolonged ACLS for 40 minutes with intermittent ROSC, 6 shocks and she was intubated in the emergency department.  On examination, she is tachypneic, hypertensive, saturating 98% on 100% FiO2.  Ongoing amiodarone, norepinephrine and propofol.  CPAP positive requiring fentanyl.  Initial results show severe mixed acidosis.  Metabolic panel with anion gap metabolic acidosis, elevated creatinine, transaminitis, and lactic acidosis.  Leukocytosis and polycythemia present.  Hyperglycemia to 285.  CT head with concern for right frontal hypodensities.  CT chest angiography unremarkable.  Enteric tube in appropriate positioning.  eICU Interventions  Maintain norepinephrine as needed for MAP greater than 65  Maintain normothermia, avoid fever, PRN tylenol as needed  DVT prophylaxis with SCDs GI prophylaxis with famotidine   0133 - Trop 2489 - continue trend. Tylenol order to start now, maintain TTM with goal normothermia. Maintain current kcl  0151 -given that the patient is directional and does not exhibit any acute deficits, will hold EEG for now.  Following commands.  0246 -persistently febrile, will add cooling blanket -can hold on Arctic sun if able to abate the fever..  And given high dose of norepi at 18 mcg, add vasopressin as well.  0618 -troponin persistently elevated 3234.  Continue to trend.  Persistent fever up to 101.5.  Obtain cultures. No cooling blankets are available.  If necessary, sedate the patient and initiate Arctic sun.  Fortunately, the patient is following commands but ongoing fever is counterproductive.  Intervention Category Evaluation Type: New  Patient Evaluation  [name] 12/05/2022, 11:24 PM

## 2022-12-05 NOTE — ED Triage Notes (Signed)
Patient BIB [facility] from home for witnessed cardiac arrest. Husband started CPR, EMS continued CPR for 40 minutes on and off with ROSC x 4 minutes upon arrival. EMS shocked 6 times, administered 9 doses of EPI, 300mg  of amiodarone, 2g magnesium. LMA airway in place, respirations agonal and assisted with BVM.

## 2022-12-05 NOTE — Consult Note (Addendum)
Cardiology Consultation   Patient ID: [name] MRN: [id number]; DOB: Apr 06, 1965  Admit date: 12/05/2022 Date of Consult: 12/05/2022  PCP:  [name], MD   [facility] Providers Cardiologist:  [name], MD        Patient Profile:   [name] is a 57 y.o. female with a hx of OSA, diastolic heart failure, morbid obesity, nonischemic CM EF 30-35%, significant depression who is being seen 12/05/2022 after cardiac arrest 2/2 VF History of Present Illness:   [name] is a patient of Dr. [name]. He saw her last December. He originally saw her for OSA.  She underwent a cath per his notes 04/11/2016  showing EF 25-30% and normal CORS. She p/w acute on chronic systolic and diastolic HF 12/2017. EF 30-35%. Her echo from May 2023 was 30-35%. She was managed on GDMT. She reported significant depression back then. Today, he husband notes she continue to deal with this. He states she was not feeling herself just prior to admission to the ED. He left her , and when he came back she was on the ground. Per EMS, her husband attempted CPR. Her rhythm when they got their was Vfib. EMS administered 6 shocks, numerous rounds of epi, multiple boluses of amiodarone, as well as magnesium. She eventually achieved ROSC and was intubated in the ED. Labs significant for WBC 15, nl hgb, K 3.4, Crt 1.1 , venous lactate 13, pH 7.  Her MAPS are normal not on pressor support.    Past Medical History:  Diagnosis Date   ADD (attention deficit disorder)    Anxiety    Arthritis    both knees right worse than left   Carpal tunnel syndrome of right wrist    Depression    Essential hypertension, benign    Gallstones    History of smoking 06/22/2016   Hyperlipidemia associated with type 2 diabetes mellitus (HCC), on Zocor 04/04/2011   Hypertension associated with diabetes (HCC) 06/03/2008   Migraines    Morbid obesity (HCC)    Morbid obesity with BMI of 50.0-59.9, adult (HCC) 07/12/2006   NICM  (nonischemic cardiomyopathy) (HCC) 07/12/2006   01/16/18 ECHO:    - Procedure narrative: Transthoracic echocardiography. Image   quality was suboptimal. The study was technically difficult.   Intravenous contrast (Definity) was administered. - Left ventricle: The cavity size was moderately dilated. Wall   thickness was increased in a pattern of mild LVH. Systolic   function was moderately to severely reduced. The estimated   ejection fraction w   Normal coronary arteries 04/17/2016   OSA on CPAP 10/28/2014   Spinal stenosis    back pain   Spondylosis without myelopathy or radiculopathy, lumbar region 12/04/2017   Type 2 diabetes mellitus with hyperglycemia [facility])     Past Surgical History:  Procedure Laterality Date   CHOLECYSTECTOMY     DILATATION & CURETTAGE/HYSTEROSCOPY WITH MYOSURE N/A 10/31/2017   Procedure: DILATATION & CURETTAGE/HYSTEROSCOPY WITH MYOSURE;  Surgeon: Romualdo Bolk, MD;  Location: [facility];  Service: Gynecology;  Laterality: N/A;   HYSTEROSCOPY WITH D & C N/A 05/07/2022   Procedure: DILATATION AND CURETTAGE /HYSTEROSCOPY;  Surgeon: [name], MD;  Location: [facility] OR;  Service: Gynecology;  Laterality: N/A;   RIGHT/LEFT HEART CATH AND CORONARY ANGIOGRAPHY N/A 04/11/2016   Procedure: Right/Left Heart Cath and Coronary Angiography;  Surgeon: Kathleene Hazel, MD;  Location: [facility] INVASIVE CV LAB;  Service: Cardiovascular;  Laterality: N/A;   sonogram for blood clots  no blockages     Home Medications:  Prior to Admission medications   Medication Sig Start Date End Date Taking? Authorizing Provider  amphetamine-dextroamphetamine (ADDERALL XR) 30 MG 24 hr capsule Take 30 mg by mouth every morning. 09/10/22   [provider]  Blood Glucose Monitoring Suppl DEVI 1 each by Does not apply route in the morning, at noon, and at bedtime. May substitute to any manufacturer covered by patient's insurance. 11/15/22   Elenore Paddy, NP  cariprazine (VRAYLAR) 3 MG  capsule Take 1 capsule (3 mg total) by mouth daily. 12/05/22   Nwoko, Tommas Olp, PA  carvedilol (COREG) 25 MG tablet TAKE 1 TABLET (25 MG TOTAL) BY MOUTH TWICE A DAY WITH MEALS 06/10/22   [name], MD  Dapagliflozin Pro-metFORMIN ER (XIGDUO XR) 11-998 MG TB24 Take 1 tablet by mouth daily. 10/01/22   [name], MD  escitalopram (LEXAPRO) 10 MG tablet Take 1 tablet (10 mg total) by mouth daily. 12/05/22 12/05/23  Nwoko, Tommas Olp, PA  fluconazole (DIFLUCAN) 150 MG tablet Take 1 tablet (150 mg total) by mouth daily. 11/15/22   Elenore Paddy, NP  furosemide (LASIX) 80 MG tablet TAKE 1 TABLET BY MOUTH EVERY DAY 11/04/22   Azalee Course, PA  gabapentin (NEURONTIN) 100 MG capsule Take 1 capsule (100 mg total) by mouth 3 (three) times daily. 12/05/22   Nwoko, Tommas Olp, PA  Glucose Blood (BLOOD GLUCOSE TEST STRIPS) STRP 1 each by In Vitro route in the morning, at noon, and at bedtime. May substitute to any manufacturer covered by patient's insurance. 11/15/22 12/20/23  Elenore Paddy, NP  Lancet Device MISC 1 each by Does not apply route in the morning, at noon, and at bedtime. May substitute to any manufacturer covered by patient's insurance. 11/15/22 12/15/22  Elenore Paddy, NP  Lancets Misc. MISC 1 each by Does not apply route in the morning, at noon, and at bedtime. May substitute to any manufacturer covered by patient's insurance. 11/15/22 12/15/22  Elenore Paddy, NP  linaclotide [facility]) 145 MCG CAPS capsule Take 1 capsule (145 mcg total) by mouth daily before breakfast. 08/13/22   [name], MD  methylphenidate (RITALIN) 5 MG tablet Take 1 tablet (5 mg total) by mouth 2 (two) times daily. 12/05/22   Nwoko, Tommas Olp, PA  metroNIDAZOLE (METROGEL) 1 % gel Apply topically daily. 08/13/22   [name], MD  Misc. Devices (FOAM CUSHION THERAPEUTIC RING) MISC Use while sitting 04/23/22   Milas Hock, MD  NON FORMULARY Pt uses cpap nightly    [provider]  potassium chloride SA (KLOR-CON  M20) 20 MEQ tablet TAKE 2 TABLETS BY MOUTH DAILY 06/10/22   [name], MD  pregabalin (LYRICA) 75 MG capsule Take 1 capsule (75 mg total) by mouth 2 (two) times daily as needed. 06/25/22   Rodolph Bong, MD  sacubitril-valsartan (ENTRESTO) 97-103 MG Take 1 tablet by mouth 2 (two) times daily. 06/10/22   [name], MD  simvastatin (ZOCOR) 20 MG tablet TAKE 1 TABLET BY MOUTH EVERY DAY 10/05/22   [name], MD  tirzepatide [facility]) 2.5 MG/0.5ML Pen INJECT 2.5 MG SUBCUTANEOUSLY WEEKLY 10/14/22   [name], MD  tiZANidine (ZANAFLEX) 2 MG tablet TAKE 1 TABLET BY MOUTH EVERY 8 HOURS AS NEEDED FOR MUSCLE SPASMS. 11/09/22   [name], MD    Inpatient Medications: Scheduled Meds:  Continuous Infusions:  amiodarone 60 mg/hr (12/05/22 1831)   [START ON 12/06/2022] amiodarone  propofol (DIPRIVAN) infusion 5 mcg/kg/min (12/05/22 1841)   PRN Meds:   Allergies:    Allergies  Allergen Reactions   Fluoxetine Other (See Comments)    More depressed   Cymbalta [Duloxetine Hcl] Other (See Comments)    depressed   Pork-Derived Products    Penicillins Rash    Has patient had a PCN reaction causing immediate rash, facial/tongue/throat swelling, SOB or lightheadedness with hypotension: YES Has patient had a PCN reaction causing severe rash involving mucus membranes or skin necrosis: NO Has patient had a PCN reaction that required hospitalization: YES Has patient had a PCN reaction occurring within the last 10 years: NO If all of the above answers are "NO", then may proceed with Cephalosporin use.     Social History:   Social History   Socioeconomic History   Marital status: Married    Spouse name: Not on file   Number of children: Not on file   Years of education: Not on file   Highest education level: Not on file  Occupational History   Not on file  Tobacco Use   Smoking status: Former    Current packs/day: 0.00    Types: Cigarettes    Quit date: 02/25/1997     Years since quitting: 25.7   Smokeless tobacco: Never   Tobacco comments:    Married, lives with spouse (when he is not traveling) and son  Vaping Use   Vaping status: Never Used  Substance and Sexual Activity   Alcohol use: No   Drug use: No   Sexual activity: Not Currently    Partners: Male    Birth control/protection: None  Other Topics Concern   Not on file  Social History Narrative   Not on file   Social Determinants of Health   Financial Resource Strain: High Risk (03/01/2022)   Overall Financial Resource Strain (CARDIA)    Difficulty of Paying Living Expenses: Hard  Food Insecurity: Food Insecurity Present (05/02/2022)   Hunger Vital Sign    Worried About Running Out of Food in the Last Year: Often true    Ran Out of Food in the Last Year: Sometimes true  Transportation Needs: No Transportation Needs (05/02/2022)   PRAPARE - Administrator, Civil Service (Medical): No    Lack of Transportation (Non-Medical): No  Physical Activity: Inactive (11/12/2022)   Exercise Vital Sign    Days of Exercise per Week: 0 days    Minutes of Exercise per Session: 0 min  Stress: Stress Concern Present (11/12/2022)   [employer] of Occupational Health - Occupational Stress Questionnaire    Feeling of Stress : Very much  Social Connections: Socially Isolated (08/08/2021)   Social Connection and Isolation Panel [NHANES]    Frequency of Communication with Friends and Family: Once a week    Frequency of Social Gatherings with Friends and Family: Once a week    Attends Religious Services: Never    Database administrator or Organizations: No    Attends Banker Meetings: Never    Marital Status: Married  Catering manager Violence: Not At Risk (08/08/2021)   Humiliation, Afraid, Rape, and Kick questionnaire    Fear of Current or Ex-Partner: No    Emotionally Abused: No    Physically Abused: No    Sexually Abused: No    Family History:    Family History  Problem  Relation Age of Onset   Depression Mother    Anxiety disorder Mother  Diabetes Mother    Hypertension Mother    ADD / ADHD Father    Diabetes Father    Hypertension Father    Colitis Father    Hypertension Other    Diabetes Other    Colitis Other    Alcohol abuse Other      ROS:  Please see the history of present illness.   All other ROS reviewed and negative.     Physical Exam/Data:   Vitals:   12/05/22 1830 12/05/22 1836 12/05/22 1845 12/05/22 1900  BP: (!) 85/63 104/73 108/80 106/73  Pulse: (!) 55 (!) 36 66 83  Resp: (!) 21  (!) 22 (!) 24  Temp:  (!) 97.2 F (36.2 C) 98.2 F (36.8 C) 98.5 F (36.9 C)  SpO2: 100% 93% 91% 94%   No intake or output data in the 24 hours ending 12/05/22 1931    12/05/2022    1:27 PM 11/15/2022    4:16 PM 08/13/2022    1:24 PM  Last 3 Weights  Weight (lbs)  300 lb 6 oz 305 lb  Weight (kg)  136.249 kg 138.347 kg     Information is confidential and restricted. Go to Review Flowsheets to unlock data.     There is no height or weight on file to calculate BMI.  General:  Intubated and sedated, morbidly obese HEENT: normal Neck: cannot assess JVD Vascular: No carotid bruits; Distal pulses 2+ bilaterally Cardiac:  normal S1, S2; RRR; no murmur  Lungs:  clear to auscultation bilaterally, no wheezing, rhonchi or rales  Abd: non distended Ext: no edema, extremities are cool Musculoskeletal:  No deformities, BUE and BLE strength normal and equal Skin: warm and dry  Neuro:  CNs 2-12 intact, no focal abnormalities noted Psych:  Normal affect   EKG:  The EKG was personally reviewed and demonstrates:  Sinus rhythm with PVCs, LBBB morphology. No significant changes from prior studies  Telemetry:  Telemetry was personally reviewed and demonstrates:  sinus  Relevant CV Studies: TTE 07/10/2021 1. Left ventricular ejection fraction, by estimation, is 30 to 35%. Left  ventricular ejection fraction by 3D volume is 32 %. The left ventricle has   moderately decreased function. The left ventricle demonstrates global  hypokinesis. The left ventricular  internal cavity size was moderately dilated. Left ventricular diastolic  parameters are consistent with Grade I diastolic dysfunction (impaired  relaxation).   2. Right ventricular systolic function is normal. The right ventricular  size is normal. Tricuspid regurgitation signal is inadequate for assessing  PA pressure.   3. The mitral valve is grossly normal. Trivial mitral valve  regurgitation.   4. The aortic valve is tricuspid. Aortic valve regurgitation is not  visualized.   5. The inferior vena cava is normal in size with <50% respiratory  variability, suggesting right atrial pressure of 8 mmHg.    Laboratory Data:  High Sensitivity Troponin:  No results for input(s): "TROPONINIHS" in the last 720 hours.   Chemistry Recent Labs  Lab 12/05/22 1827  NA 139  140  K 3.5  3.4*  CL 104  GLUCOSE 285*  BUN 21*  CREATININE 1.10*    No results for input(s): "PROT", "ALBUMIN", "AST", "ALT", "ALKPHOS", "BILITOT" in the last 168 hours. Lipids No results for input(s): "CHOL", "TRIG", "HDL", "LABVLDL", "LDLCALC", "CHOLHDL" in the last 168 hours.  Hematology Recent Labs  Lab 12/05/22 1825 12/05/22 1827  WBC 15.8*  --   RBC 5.55*  --   HGB 15.9* 17.0*  17.3*  HCT 51.7* 50.0*  51.0*  MCV 93.2  --   MCH 28.6  --   MCHC 30.8  --   RDW 14.6  --   PLT 246  --    Thyroid No results for input(s): "TSH", "FREET4" in the last 168 hours.  BNPNo results for input(s): "BNP", "PROBNP" in the last 168 hours.  DDimer No results for input(s): "DDIMER" in the last 168 hours.   Radiology/Studies:  No results found.   Assessment and Plan:   VF arrest NICM EF 30-35% She had LHC 04/11/2016 that showed no obstructive coronary dx. This is not in the setting STEMI. Will see how she trends before planning LHC. Her lactate is quite high, her prognosis is poor. - consider cooling  protocol - trend lactate, co-ox - continue IV amiodarone - if her rhythm remains stable, will consider LHC as well as ICD, if she has no culprit lesion. +/- lifevest - hold home GDMT   Time Spent Directly with Patient:  I have spent a total of 60 minutes with the patient reviewing hospital notes, telemetry, EKGs, labs and examining the patient as well as establishing an assessment and plan that was discussed personally with the patient.  > 50% of time was spent in direct patient care.    Risk Assessment/Risk Scores:       For questions or updates, please contact [facility] Please consult [email or web address] for contact info under    Signed, Maisie Fus, MD  12/05/2022 7:31 PM

## 2022-12-05 NOTE — H&P (Signed)
NAME:  [name], MRN:  [id number], DOB:  [birth year], LOS: 0 ADMISSION DATE:  12/05/2022, CONSULTATION DATE: 10/10 REFERRING MD: Dr. Particia Nearing, CHIEF COMPLAINT: Cardiac arrest  History of Present Illness:  57 year old female with past medical history as below, which is significant for hypertension, hyperlipidemia, CHF with EF 30 to 35% on GDMT, cardiac cath in 2018 showed no obstruction, DM, and morbid obesity who presented to [facility] emergency department on 10/10 after suffering cardiac arrest.  Patient was at home with her husband when she suffered a witnessed cardiac arrest.  She did have bystander CPR.  Upon EMS arrival she was found to be in VT/VF and ACLS was administered over over several arrest periods totaling up to 40 minutes.  Each arrest was VT/VF.  The patient received 6 defibrillations, 9 epi pushes, IV magnesium, and 2 doses of amiodarone.  Upon arrival to the emergency department she was intubated for airway protection.  She was evaluated by cardiology in the ED and PCCM was asked to evaluate for admission.  Pertinent  Medical History   has a past medical history of ADD (attention deficit disorder), Anxiety, Arthritis, Carpal tunnel syndrome of right wrist, Depression, Essential hypertension, benign, Gallstones, History of smoking (06/22/2016), Hyperlipidemia associated with type 2 diabetes mellitus (HCC), on Zocor (04/04/2011), Hypertension associated with diabetes (HCC) (06/03/2008), Migraines, Morbid obesity (HCC), Morbid obesity with BMI of 50.0-59.9, adult (HCC) (07/12/2006), NICM (nonischemic cardiomyopathy) (HCC) (07/12/2006), Normal coronary arteries (04/17/2016), OSA on CPAP (10/28/2014), Spinal stenosis, Spondylosis without myelopathy or radiculopathy, lumbar region (12/04/2017), and Type 2 diabetes mellitus with hyperglycemia (HCC).   Significant Hospital Events: Including procedures, antibiotic start and stop dates in addition to other pertinent events   10/10 admitted s/p VF  arrest  Interim History / Subjective:    Objective   Blood pressure 106/73, pulse 83, temperature 98.5 F (36.9 C), resp. rate (!) 24, SpO2 94%.    Vent Mode: PRVC FiO2 (%):  [100 %] 100 % Set Rate:  [18 bmp-24 bmp] 24 bmp Vt Set:  [460 mL] 460 mL PEEP:  [8 cmH20] 8 cmH20 Plateau Pressure:  [25 cmH20] 25 cmH20  No intake or output data in the 24 hours ending 12/05/22 2026 There were no vitals filed for this visit.  Examination: General: Morbidly obese female HENT: [place]/AT, PERRL, unable to appreciate JVD Lungs: Distant, good air entry bilaterally.  Cardiovascular: RRR, no MRG Abdomen: Soft, Non-distended Extremities: No acute deformity or significant edema.  Neuro: Unresponsive on propofol 5 GU: Foley making urine  Resolved Hospital Problem list     Assessment & Plan:   Cardiac arrest: VT/VF per report. HFrEF history. Multiple codes downtime totals to 40 mins.  - Admit to ICU - TTM protocol avoid fever - CT head - EEG - IV amiodarone - Cardiology not recommending heparin infusion - Keep K > 4 and Mag > 2  Acute respiratory failure with hypoxia and hypercarbia - Full vent support - Settings reviewed and adjusted - ABG pending - Low dose prop and PRN fent as needed for RASS 0 to -1.   Chronic HFrEF: LVEF 30-35% on most recent echo - appreciate cardiology - Echo - Holding GDMT for hypotension  Lactic acidosis - hemodynamic support - Trend lactic  DM - CBG monitoring and SSI  Hypokalemia AKI - replete K - Trend chemistry   Best Practice (right click and "Reselect all SmartList Selections" daily)   Diet/type: NPO DVT prophylaxis: SCD - pork allergy GI prophylaxis: H2B Lines: N/A Foley:  Yes, and it is still needed Code Status:  full code Last date of multidisciplinary goals of care discussion [ ]  Unable to contact husband via phone  Labs   CBC: Recent Labs  Lab 12/05/22 1825 12/05/22 1827 12/05/22 2008  WBC 15.8*  --   --   HGB 15.9* 17.0*   17.3* 16.3*  HCT 51.7* 50.0*  51.0* 48.0*  MCV 93.2  --   --   PLT 246  --   --     Basic Metabolic Panel: Recent Labs  Lab 12/05/22 1825 12/05/22 1827 12/05/22 2008  NA 142 139  140 137  K 3.7 3.5  3.4* 3.8  CL 100 104  --   CO2 15*  --   --   GLUCOSE 289* 285*  --   BUN 17 21*  --   CREATININE 1.34* 1.10*  --   CALCIUM 9.6  --   --    GFR: Estimated Creatinine Clearance: 77.5 mL/min (A) (by C-G formula based on SCr of 1.1 mg/dL (H)). Recent Labs  Lab 12/05/22 1825 12/05/22 1829 12/05/22 1916  WBC 15.8*  --   --   LATICACIDVEN  --  13.7* >9.0*    Liver Function Tests: Recent Labs  Lab 12/05/22 1825  AST 182*  ALT 203*  ALKPHOS 42  BILITOT 0.9  PROT 6.3*  ALBUMIN 3.1*   No results for input(s): "LIPASE", "AMYLASE" in the last 168 hours. No results for input(s): "AMMONIA" in the last 168 hours.  ABG    Component Value Date/Time   PHART 7.180 (LL) 12/05/2022 2008   PCO2ART 60.4 (H) 12/05/2022 2008   PO2ART 91 12/05/2022 2008   HCO3 22.6 12/05/2022 2008   TCO2 24 12/05/2022 2008   ACIDBASEDEF 7.0 (H) 12/05/2022 2008   O2SAT 94 12/05/2022 2008     Coagulation Profile: No results for input(s): "INR", "PROTIME" in the last 168 hours.  Cardiac Enzymes: No results for input(s): "CKTOTAL", "CKMB", "CKMBINDEX", "TROPONINI" in the last 168 hours.  HbA1C: Hgb A1c MFr Bld  Date/Time Value Ref Range Status  08/13/2022 02:09 PM 6.8 (H) 4.6 - 6.5 % Final    Comment:    Glycemic Control Guidelines for People with Diabetes:Non Diabetic:  <6%Goal of Therapy: <7%Additional Action Suggested:  >8%   05/02/2022 11:37 AM 6.5 (H) 4.8 - 5.6 % Final    Comment:    (NOTE)         Prediabetes: 5.7 - 6.4         Diabetes: >6.4         Glycemic control for adults with diabetes: <7.0     CBG: Recent Labs  Lab 12/05/22 1818 12/05/22 1829  GLUCAP 270* 243*    Review of Systems:   Patient is encephalopathic and/or intubated; therefore, history has been  obtained from chart review.    Past Medical History:  She,  has a past medical history of ADD (attention deficit disorder), Anxiety, Arthritis, Carpal tunnel syndrome of right wrist, Depression, Essential hypertension, benign, Gallstones, History of smoking (06/22/2016), Hyperlipidemia associated with type 2 diabetes mellitus (HCC), on Zocor (04/04/2011), Hypertension associated with diabetes (HCC) (06/03/2008), Migraines, Morbid obesity (HCC), Morbid obesity with BMI of 50.0-59.9, adult (HCC) (07/12/2006), NICM (nonischemic cardiomyopathy) (HCC) (07/12/2006), Normal coronary arteries (04/17/2016), OSA on CPAP (10/28/2014), Spinal stenosis, Spondylosis without myelopathy or radiculopathy, lumbar region (12/04/2017), and Type 2 diabetes mellitus with hyperglycemia (HCC).   Surgical History:   Past Surgical History:  Procedure Laterality Date   CHOLECYSTECTOMY  DILATATION & CURETTAGE/HYSTEROSCOPY WITH MYOSURE N/A 10/31/2017   Procedure: DILATATION & CURETTAGE/HYSTEROSCOPY WITH MYOSURE;  Surgeon: Romualdo Bolk, MD;  Location: [facility] ORS;  Service: Gynecology;  Laterality: N/A;   HYSTEROSCOPY WITH D & C N/A 05/07/2022   Procedure: DILATATION AND CURETTAGE /HYSTEROSCOPY;  Surgeon: [name], MD;  Location: [facility] OR;  Service: Gynecology;  Laterality: N/A;   RIGHT/LEFT HEART CATH AND CORONARY ANGIOGRAPHY N/A 04/11/2016   Procedure: Right/Left Heart Cath and Coronary Angiography;  Surgeon: Kathleene Hazel, MD;  Location: [facility] INVASIVE CV LAB;  Service: Cardiovascular;  Laterality: N/A;   sonogram for blood clots     no blockages     Social History:   reports that she quit smoking about 25 years ago. Her smoking use included cigarettes. She has never used smokeless tobacco. She reports that she does not drink alcohol and does not use drugs.   Family History:  Her family history includes ADD / ADHD in her father; Alcohol abuse in an other family member; Anxiety disorder in her mother; Colitis in  her father and another family member; Depression in her mother; Diabetes in her father, mother, and another family member; Hypertension in her father, mother, and another family member.   Allergies Allergies  Allergen Reactions   Fluoxetine Other (See Comments)    More depressed   Cymbalta [Duloxetine Hcl] Other (See Comments)    depressed   Pork-Derived Products    Penicillins Rash    Has patient had a PCN reaction causing immediate rash, facial/tongue/throat swelling, SOB or lightheadedness with hypotension: YES Has patient had a PCN reaction causing severe rash involving mucus membranes or skin necrosis: NO Has patient had a PCN reaction that required hospitalization: YES Has patient had a PCN reaction occurring within the last 10 years: NO If all of the above answers are "NO", then may proceed with Cephalosporin use.      Home Medications  Prior to Admission medications   Medication Sig Start Date End Date Taking? Authorizing Provider  amphetamine-dextroamphetamine (ADDERALL XR) 30 MG 24 hr capsule Take 30 mg by mouth every morning. 09/10/22   [provider]  Blood Glucose Monitoring Suppl DEVI 1 each by Does not apply route in the morning, at noon, and at bedtime. May substitute to any manufacturer covered by patient's insurance. 11/15/22   Elenore Paddy, NP  cariprazine (VRAYLAR) 3 MG capsule Take 1 capsule (3 mg total) by mouth daily. 12/05/22   Nwoko, Tommas Olp, PA  carvedilol (COREG) 25 MG tablet TAKE 1 TABLET (25 MG TOTAL) BY MOUTH TWICE A DAY WITH MEALS Patient taking differently: Take 25 mg by mouth 2 (two) times daily with a meal. 06/10/22   [name], MD  Dapagliflozin Pro-metFORMIN ER (XIGDUO XR) 11-998 MG TB24 Take 1 tablet by mouth daily. 10/01/22   [name], MD  escitalopram (LEXAPRO) 10 MG tablet Take 1 tablet (10 mg total) by mouth daily. 12/05/22 12/05/23  Nwoko, Tommas Olp, PA  fluconazole (DIFLUCAN) 150 MG tablet Take 1 tablet (150 mg total) by  mouth daily. 11/15/22   Elenore Paddy, NP  furosemide (LASIX) 80 MG tablet TAKE 1 TABLET BY MOUTH EVERY DAY 11/04/22   Azalee Course, PA  gabapentin (NEURONTIN) 100 MG capsule Take 1 capsule (100 mg total) by mouth 3 (three) times daily. 12/05/22   Nwoko, Tommas Olp, PA  Glucose Blood (BLOOD GLUCOSE TEST STRIPS) STRP 1 each by In Vitro route in the morning, at noon, and at bedtime. May  substitute to any manufacturer covered by patient's insurance. 11/15/22 12/20/23  Elenore Paddy, NP  hydrOXYzine (ATARAX) 25 MG tablet Take 25-50 mg by mouth daily as needed for anxiety. 11/12/22   [provider]  ibuprofen (ADVIL) 800 MG tablet Take 800 mg by mouth every 8 (eight) hours as needed for mild pain or moderate pain. 09/13/22   [provider]  Lancet Device MISC 1 each by Does not apply route in the morning, at noon, and at bedtime. May substitute to any manufacturer covered by patient's insurance. 11/15/22 12/15/22  Elenore Paddy, NP  Lancets Misc. MISC 1 each by Does not apply route in the morning, at noon, and at bedtime. May substitute to any manufacturer covered by patient's insurance. 11/15/22 12/15/22  Elenore Paddy, NP  linaclotide [facility]) 145 MCG CAPS capsule Take 1 capsule (145 mcg total) by mouth daily before breakfast. 08/13/22   [name], MD  methylphenidate (RITALIN) 5 MG tablet Take 1 tablet (5 mg total) by mouth 2 (two) times daily. 12/05/22   Nwoko, Tommas Olp, PA  metroNIDAZOLE (METROGEL) 1 % gel Apply topically daily. 08/13/22   [name], MD  Misc. Devices (FOAM CUSHION THERAPEUTIC RING) MISC Use while sitting 04/23/22   Milas Hock, MD  NON FORMULARY Pt uses cpap nightly    [provider]  potassium chloride SA (KLOR-CON M20) 20 MEQ tablet TAKE 2 TABLETS BY MOUTH DAILY 06/10/22   [name], MD  pregabalin (LYRICA) 75 MG capsule Take 1 capsule (75 mg total) by mouth 2 (two) times daily as needed. 06/25/22   Rodolph Bong, MD  sacubitril-valsartan  (ENTRESTO) 97-103 MG Take 1 tablet by mouth 2 (two) times daily. 06/10/22   [name], MD  simvastatin (ZOCOR) 20 MG tablet TAKE 1 TABLET BY MOUTH EVERY DAY 10/05/22   [name], MD  tirzepatide [facility]) 2.5 MG/0.5ML Pen INJECT 2.5 MG SUBCUTANEOUSLY WEEKLY Patient taking differently: Inject 2.5 mg into the skin once a week. 10/14/22   [name], MD  tiZANidine (ZANAFLEX) 2 MG tablet TAKE 1 TABLET BY MOUTH EVERY 8 HOURS AS NEEDED FOR MUSCLE SPASMS. 11/09/22   [name], MD     Critical care time: 51 minutes     Joneen Roach, AGACNP-BC [facility]  See Amion for personal pager PCCM on call pager [phone] until 7pm. Please call Elink 7p-7a. [phone]  12/05/2022 8:56 PM

## 2022-12-05 NOTE — ED Provider Notes (Signed)
[place] EMERGENCY DEPARTMENT AT [facility] Provider Note   CSN: [id number] Arrival date & time: 12/05/22  1814     History  Chief Complaint  Patient presents with   Post CPR    [name] is a 57 y.o. female.  HPI  57 year old female past medical history of OSA, hypertension, hyperlipidemia, CHF, type 2 diabetes presenting as postarrest.  History is provided by EMS.  This was a reported witnessed arrest by husband who attempted CPR.  Rhythm was V-fib plus V. tach on arrival.  EMS administered 6 shocks, numerous rounds of epi, multiple boluses of amiodarone, as well as magnesium.  They state that the patient alternated between ROSC and a resting 3-4 times.  Blood glucose greater than 220.     Home Medications Prior to Admission medications   Medication Sig Start Date End Date Taking? Authorizing Provider  amphetamine-dextroamphetamine (ADDERALL XR) 30 MG 24 hr capsule Take 30 mg by mouth every morning. 09/10/22   [provider]  Blood Glucose Monitoring Suppl DEVI 1 each by Does not apply route in the morning, at noon, and at bedtime. May substitute to any manufacturer covered by patient's insurance. 11/15/22   Elenore Paddy, NP  cariprazine (VRAYLAR) 3 MG capsule Take 1 capsule (3 mg total) by mouth daily. 12/05/22   Nwoko, Tommas Olp, PA  carvedilol (COREG) 25 MG tablet TAKE 1 TABLET (25 MG TOTAL) BY MOUTH TWICE A DAY WITH MEALS Patient taking differently: Take 25 mg by mouth 2 (two) times daily with a meal. 06/10/22   [name], MD  Dapagliflozin Pro-metFORMIN ER (XIGDUO XR) 11-998 MG TB24 Take 1 tablet by mouth daily. 10/01/22   [name], MD  escitalopram (LEXAPRO) 10 MG tablet Take 1 tablet (10 mg total) by mouth daily. 12/05/22 12/05/23  Nwoko, Tommas Olp, PA  fluconazole (DIFLUCAN) 150 MG tablet Take 1 tablet (150 mg total) by mouth daily. 11/15/22   Elenore Paddy, NP  furosemide (LASIX) 80 MG tablet TAKE 1 TABLET BY MOUTH EVERY DAY 11/04/22    Azalee Course, PA  gabapentin (NEURONTIN) 100 MG capsule Take 1 capsule (100 mg total) by mouth 3 (three) times daily. 12/05/22   Nwoko, Tommas Olp, PA  Glucose Blood (BLOOD GLUCOSE TEST STRIPS) STRP 1 each by In Vitro route in the morning, at noon, and at bedtime. May substitute to any manufacturer covered by patient's insurance. 11/15/22 12/20/23  Elenore Paddy, NP  hydrOXYzine (ATARAX) 25 MG tablet Take 25-50 mg by mouth daily as needed for anxiety. 11/12/22   [provider]  ibuprofen (ADVIL) 800 MG tablet Take 800 mg by mouth every 8 (eight) hours as needed for mild pain or moderate pain. 09/13/22   [provider]  Lancet Device MISC 1 each by Does not apply route in the morning, at noon, and at bedtime. May substitute to any manufacturer covered by patient's insurance. 11/15/22 12/15/22  Elenore Paddy, NP  Lancets Misc. MISC 1 each by Does not apply route in the morning, at noon, and at bedtime. May substitute to any manufacturer covered by patient's insurance. 11/15/22 12/15/22  Elenore Paddy, NP  linaclotide [facility]) 145 MCG CAPS capsule Take 1 capsule (145 mcg total) by mouth daily before breakfast. 08/13/22   [name], MD  methylphenidate (RITALIN) 5 MG tablet Take 1 tablet (5 mg total) by mouth 2 (two) times daily. 12/05/22   Nwoko, Tommas Olp, PA  metroNIDAZOLE (METROGEL) 1 % gel Apply topically  daily. 08/13/22   [name], MD  Misc. Devices (FOAM CUSHION THERAPEUTIC RING) MISC Use while sitting 04/23/22   Milas Hock, MD  NON FORMULARY Pt uses cpap nightly    [provider]  potassium chloride SA (KLOR-CON M20) 20 MEQ tablet TAKE 2 TABLETS BY MOUTH DAILY 06/10/22   [name], MD  pregabalin (LYRICA) 75 MG capsule Take 1 capsule (75 mg total) by mouth 2 (two) times daily as needed. 06/25/22   Rodolph Bong, MD  sacubitril-valsartan (ENTRESTO) 97-103 MG Take 1 tablet by mouth 2 (two) times daily. 06/10/22   [name], MD  simvastatin (ZOCOR) 20 MG  tablet TAKE 1 TABLET BY MOUTH EVERY DAY 10/05/22   [name], MD  tirzepatide [facility]) 2.5 MG/0.5ML Pen INJECT 2.5 MG SUBCUTANEOUSLY WEEKLY Patient taking differently: Inject 2.5 mg into the skin once a week. 10/14/22   [name], MD  tiZANidine (ZANAFLEX) 2 MG tablet TAKE 1 TABLET BY MOUTH EVERY 8 HOURS AS NEEDED FOR MUSCLE SPASMS. 11/09/22   [name], MD      Allergies    Fluoxetine, Cymbalta [duloxetine hcl], Pork-derived products, and Penicillins    Review of Systems   Review of Systems  Physical Exam Updated Vital Signs BP 106/73   Pulse 83   Temp 98.5 F (36.9 C)   Resp (!) 24   SpO2 94%  Physical Exam Constitutional:      General: She is in acute distress.     Appearance: She is obese. She is ill-appearing.     Comments: Tachypneic, but with no spontaneous movement.  Not protecting airway  HENT:     Head: Normocephalic.     Comments: Abrasion to left forehead    Nose: Nose normal.     Mouth/Throat:     Mouth: Mucous membranes are moist.  Eyes:     Pupils: Pupils are equal, round, and reactive to light.     Comments: 4 mm and reactive bilaterally  Cardiovascular:     Rate and Rhythm: Normal rate. Rhythm irregular.  Pulmonary:     Comments: Bag-valve-mask on arrival Abdominal:     Tenderness: There is no guarding or rebound.  Skin:    General: Skin is warm and dry.     Findings: No rash.  Neurological:     Comments: Nonresponsive to verbal or physical stimuli     ED Results / Procedures / Treatments   Labs (all labs ordered are listed, but only abnormal results are displayed) Labs Reviewed  CBC - Abnormal; Notable for the following components:      Result Value   WBC 15.8 (*)    RBC 5.55 (*)    Hemoglobin 15.9 (*)    HCT 51.7 (*)    All other components within normal limits  COMPREHENSIVE METABOLIC PANEL - Abnormal; Notable for the following components:   CO2 15 (*)    Glucose, Bld 289 (*)    Creatinine, Ser 1.34 (*)    Total  Protein 6.3 (*)    Albumin 3.1 (*)    AST 182 (*)    ALT 203 (*)    GFR, Estimated 46 (*)    Anion gap 27 (*)    All other components within normal limits  LACTIC ACID, PLASMA - Abnormal; Notable for the following components:   Lactic Acid, Venous >9.0 (*)    All other components within normal limits  CBG MONITORING, ED - Abnormal; Notable for the following components:  Glucose-Capillary 270 (*)    All other components within normal limits  I-STAT CHEM 8, ED - Abnormal; Notable for the following components:   Potassium 3.4 (*)    BUN 21 (*)    Creatinine, Ser 1.10 (*)    Glucose, Bld 285 (*)    Calcium, Ion 1.11 (*)    TCO2 19 (*)    Hemoglobin 17.3 (*)    HCT 51.0 (*)    All other components within normal limits  I-STAT CG4 LACTIC ACID, ED - Abnormal; Notable for the following components:   Lactic Acid, Venous 13.7 (*)    All other components within normal limits  I-STAT VENOUS BLOOD GAS, ED - Abnormal; Notable for the following components:   pH, Ven 7.049 (*)    Bicarbonate 16.4 (*)    TCO2 18 (*)    Acid-base deficit 15.0 (*)    Calcium, Ion 1.09 (*)    HCT 50.0 (*)    Hemoglobin 17.0 (*)    All other components within normal limits  CBG MONITORING, ED - Abnormal; Notable for the following components:   Glucose-Capillary 243 (*)    All other components within normal limits  I-STAT ARTERIAL BLOOD GAS, ED - Abnormal; Notable for the following components:   pH, Arterial 7.180 (*)    pCO2 arterial 60.4 (*)    Acid-base deficit 7.0 (*)    HCT 48.0 (*)    Hemoglobin 16.3 (*)    All other components within normal limits  TROPONIN I (HIGH SENSITIVITY) - Abnormal; Notable for the following components:   Troponin I (High Sensitivity) 89 (*)    All other components within normal limits  LACTIC ACID, PLASMA  TRIGLYCERIDES  I-STAT CG4 LACTIC ACID, ED  TROPONIN I (HIGH SENSITIVITY)    EKG None  Radiology No results found.  Procedures Procedure Name:  Intubation Date/Time: 12/05/2022 7:08 PM  Performed by: Lyman Speller, MDPre-anesthesia Checklist: Patient identified, Timeout performed, Patient being monitored and Suction available Oxygen Delivery Method: Ambu bag Preoxygenation: Pre-oxygenation with 100% oxygen Laryngoscope Size: Glidescope and 3 Grade View: Grade I Tube size: 8.0 mm Number of attempts: 1 Placement Confirmation: ETT inserted through vocal cords under direct vision, Positive ETCO2, CO2 detector and Breath sounds checked- equal and bilateral Secured at: 23 cm Tube secured with: ETT holder        Medications Ordered in ED Medications  amiodarone (NEXTERONE PREMIX) 360-4.14 MG/200ML-% (1.8 mg/mL) IV infusion (60 mg/hr Intravenous New Bag/Given 12/05/22 1831)  amiodarone (NEXTERONE PREMIX) 360-4.14 MG/200ML-% (1.8 mg/mL) IV infusion (has no administration in time range)  propofol (DIPRIVAN) 1000 MG/100ML infusion (5 mcg/kg/min  135.4 kg Intravenous New Bag/Given 12/05/22 1841)  etomidate (AMIDATE) injection (30 mg Intravenous Given 12/05/22 1819)  rocuronium (ZEMURON) injection (100 mg Intravenous Given 12/05/22 1820)    ED Course/ Medical Decision Making/ A&P                                 Medical Decision Making Amount and/or Complexity of Data Reviewed Labs: ordered. Radiology: ordered.  Risk Prescription drug management. Decision regarding hospitalization.   [name] is a 57 y.o. female with PMHX of OSA, hypertension, hyperlipidemia, CHF, type 2 diabetes who presents s/p cardiac arrest in the field. Per EMS, patient was reportedly a witnessed arrest while at home with bystander CPR.  Patient had several rearrest with ROSC upon arrival.  Initial rhythm VF/VT. EMS administered 6 shocks, 9 rounds  of epi, and several rounds of amiodarone and even a magnesium bolus. Approximately code time (across multiple arrests) 40 minutes prior to ROSC.  On arrival, patient had an LMA in place.  This was changed  to an 8 oh cuffed endotracheal tube.  Please see intubation note above for details.  Adequate IV access rapidly obtained on arrival.  Manual blood pressure obtained, which demonstrated blood pressure with MAP consistently greater than 75.  Defibrillator pads placed with defibrillator ready at bedside. After initial stabilization, broad work-up initiated.   Concern for possible ACS, PE, pericardial effusion/tamponade, head bleed/CVA, intra-abdominal pathology, electrolyte derangement, drug-induced, among others as the precipitating event.    Initial EKG showed wide-complex sinus rhythm on my independent review. Not a STEMI.  Given VT/VF arrest, patient ordered for Amio drip.  Due to already received approximately 450mg  of amiodarone with boluses via EMS. Cardiology was consulted who evaluated patient at bedside. They have recommended continuation of amiodarone and will consider LHC.    Broad laboratory work-up obtained as well as CT head, CTA PE study.  No intracranial hemorrhage was seen, but there was a questionable area of low density in the right frontal lobe.  Can consider MRI as an inpatient.  No signs of pulmonary emboli seen on CTA.  No obvious pneumonia.  Lab Work-up: WBC 15.8.  On metabolic panel, patient has a creatinine of 1.34, representative of mild AKI.  Does have elevations in AST and ALT with AST 182 and ALT 203.  Anion gap notably elevated at 27 in the setting of a lactic of 13.7.  Initial troponin 89.  Patient will be admitted to medical ICU for continued management with cardiology following along closely.      Final Clinical Impression(s) / ED Diagnoses Final diagnoses:  Cardiac arrest [facility])    Rx / DC Orders ED Discharge Orders     None         Lyman Speller, MD 12/05/22 2207    Jacalyn Lefevre, MD 12/05/22 2220

## 2022-12-05 NOTE — ED Notes (Signed)
Date and time results received: 12/05/22 21:25 (use smartphrase ".now" to insert current time)  Test: Lactic Acid Critical Value: >9.0  Name of Provider Notified: Cyril Mourning MD  Orders Received? Or Actions Taken?:  MD to review

## 2022-12-05 NOTE — Progress Notes (Signed)
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  Patient is currently being managed on the following psychiatric medications:  Ritalin 5 mg 2 times daily Vraylar 1.5 mg daily Gabapentin 100 mg 3 times daily Lexapro 10 mg daily  Patient reports that her Ritalin was not filled due to insurance.  Since the last encounter, patient reports that she feels stuck in place.  She feels that there are 2 people inside of her with one aspect of her wanting to be productive while the other aspect told her not to do things.  Although patient has been struggling with her mood, she reports that she has been going to pool and land therapy sessions to help with her weight.  She informed provider that when she was told that her therapy session would end, she prematurely stopped attending her sessions.  After some deliberation, patient reports that she has restarted her sessions.  Patient reports that she did not want to come to this appointment originally due to her mood.  She reports that she carries on way too much stress that if often impacted by the current events of the world.  Patient reports that she takes her meds regularly but denies feeling a difference.  She continues to endorse ongoing depression and rates her depression an 8-1/2 to 9 out of 10 with 10 being most severe.  Patient endorses depressive episodes every day.  Patient endorses the following depressive symptoms: feelings of sadness, lack of motivation, feeling overwhelmed, decreased energy,  and excessive worrying.  Patient also endorses anxiety and rates her anxiety at 9 out of 10.  Patient denies the following stressors: car recently breaking down and current day politics.  A PHQ-9 screen was performed with the patient scoring a 21.  A GAD-7 screen was also performed with the patient scoring a 16.  Patient is alert and oriented x 4, calm, cooperative, and fully engaged in conversation during the encounter.  Patient describes her mood as anxious, hopeless, and very distressed.  Patient denies suicidal or homicidal ideations.  She further denies auditory or visual hallucinations and does not appear to be responding to internal/external stimuli.  Patient endorses poor sleep stating that she often wakes up 2-3 times a night.  Patient endorses good appetite and eats on average 2-3 meals per day.  Patient denies alcohol consumption, tobacco use, or illicit drug use.  Visit Diagnosis:    ICD-10-CM   1. Major depressive disorder, recurrent episode, severe with anxious distress (HCC)  F33.2 escitalopram (LEXAPRO) 10 MG tablet    cariprazine (VRAYLAR) 3 MG capsule    2. Anxiety  F41.9 escitalopram (LEXAPRO) 10 MG tablet    gabapentin (NEURONTIN) 100 MG capsule    3. ADD (attention deficit disorder) without hyperactivity  F98.8 methylphenidate (RITALIN) 5 MG tablet      Past Psychiatric History:  Patient has a past psychiatric history significant for Major depressive disorder, anxiety, possible OCD, and ADD.   Past Medical History:  Past Medical History:  Diagnosis Date   ADD (attention deficit disorder)    Anxiety    Arthritis  both knees right worse than left   Carpal tunnel syndrome of right wrist    Depression    Essential hypertension, benign    Gallstones    History of smoking 06/22/2016   Hyperlipidemia associated with type 2 diabetes mellitus (HCC), on Zocor 04/04/2011   Hypertension associated with diabetes (HCC) 06/03/2008   Migraines    Morbid obesity (HCC)    Morbid  obesity with BMI of 50.0-59.9, adult (HCC) 07/12/2006   NICM (nonischemic cardiomyopathy) (HCC) 07/12/2006   01/16/18 ECHO:    - Procedure narrative: Transthoracic echocardiography. Image   quality was suboptimal. The study was technically difficult.   Intravenous contrast (Definity) was administered. - Left ventricle: The cavity size was moderately dilated. Wall   thickness was increased in a pattern of mild LVH. Systolic   function was moderately to severely reduced. The estimated   ejection fraction w   Normal coronary arteries 04/17/2016   OSA on CPAP 10/28/2014   Spinal stenosis    back pain   Spondylosis without myelopathy or radiculopathy, lumbar region 12/04/2017   Type 2 diabetes mellitus with hyperglycemia [facility])     Past Surgical History:  Procedure Laterality Date   CHOLECYSTECTOMY     DILATATION & CURETTAGE/HYSTEROSCOPY WITH MYOSURE N/A 10/31/2017   Procedure: DILATATION & CURETTAGE/HYSTEROSCOPY WITH MYOSURE;  Surgeon: Romualdo Bolk, MD;  Location: [facility];  Service: Gynecology;  Laterality: N/A;   HYSTEROSCOPY WITH D & C N/A 05/07/2022   Procedure: DILATATION AND CURETTAGE /HYSTEROSCOPY;  Surgeon: [name], MD;  Location: [facility] OR;  Service: Gynecology;  Laterality: N/A;   RIGHT/LEFT HEART CATH AND CORONARY ANGIOGRAPHY N/A 04/11/2016   Procedure: Right/Left Heart Cath and Coronary Angiography;  Surgeon: Kathleene Hazel, MD;  Location: [facility] INVASIVE CV LAB;  Service: Cardiovascular;  Laterality: N/A;   sonogram for blood clots     no blockages    Family Psychiatric History:  Patient reports that her mother had several psych issues including depression, anxiety, and possible OCD Father - OCD Son - Asperger's, ADD Grandmother (ma) - depression, anxiety. History of some form of electric/stimulation therapy 2 Uncles - depression, anxiety 2nd cousins - OCD, ADD, depression, anxiety  Family History:  Family History  Problem Relation Age of Onset   Depression Mother     Anxiety disorder Mother    Diabetes Mother    Hypertension Mother    ADD / ADHD Father    Diabetes Father    Hypertension Father    Colitis Father    Hypertension Other    Diabetes Other    Colitis Other    Alcohol abuse Other     Social History:  Social History   Socioeconomic History   Marital status: Married    Spouse name: Not on file   Number of children: Not on file   Years of education: Not on file   Highest education level: Not on file  Occupational History   Not on file  Tobacco Use   Smoking status: Former    Current packs/day: 0.00    Types: Cigarettes    Quit date: 02/25/1997    Years since quitting: 25.7   Smokeless tobacco: Never   Tobacco comments:    Married, lives with spouse (when he is not traveling) and son  Vaping Use   Vaping status: Never Used  Substance and Sexual Activity   Alcohol use: No   Drug use: No   Sexual activity: Not Currently    Partners: Male  Birth control/protection: None  Other Topics Concern   Not on file  Social History Narrative   Not on file   Social Determinants of Health   Financial Resource Strain: High Risk (03/01/2022)   Overall Financial Resource Strain (CARDIA)    Difficulty of Paying Living Expenses: Hard  Food Insecurity: Food Insecurity Present (05/02/2022)   Hunger Vital Sign    Worried About Running Out of Food in the Last Year: Often true    Ran Out of Food in the Last Year: Sometimes true  Transportation Needs: No Transportation Needs (05/02/2022)   PRAPARE - Administrator, Civil Service (Medical): No    Lack of Transportation (Non-Medical): No  Physical Activity: Inactive (11/12/2022)   Exercise Vital Sign    Days of Exercise per Week: 0 days    Minutes of Exercise per Session: 0 min  Stress: Stress Concern Present (11/12/2022)   [employer] of Occupational Health - Occupational Stress Questionnaire    Feeling of Stress : Very much  Social Connections: Socially Isolated  (08/08/2021)   Social Connection and Isolation Panel [NHANES]    Frequency of Communication with Friends and Family: Once a week    Frequency of Social Gatherings with Friends and Family: Once a week    Attends Religious Services: Never    Database administrator or Organizations: No    Attends Banker Meetings: Never    Marital Status: Married    Allergies:  Allergies  Allergen Reactions   Fluoxetine Other (See Comments)    More depressed   Cymbalta [Duloxetine Hcl] Other (See Comments)    depressed   Pork-Derived Products    Penicillins Rash    Has patient had a PCN reaction causing immediate rash, facial/tongue/throat swelling, SOB or lightheadedness with hypotension: YES Has patient had a PCN reaction causing severe rash involving mucus membranes or skin necrosis: NO Has patient had a PCN reaction that required hospitalization: YES Has patient had a PCN reaction occurring within the last 10 years: NO If all of the above answers are "NO", then may proceed with Cephalosporin use.     Metabolic Disorder Labs: Lab Results  Component Value Date   HGBA1C 6.8 (H) 08/13/2022   MPG 140 05/02/2022   MPG 139.85 10/29/2017   No results found for: "PROLACTIN" Lab Results  Component Value Date   CHOL 194 08/13/2022   TRIG 138.0 08/13/2022   HDL 48.90 08/13/2022   CHOLHDL 4 08/13/2022   VLDL 27.6 08/13/2022   LDLCALC 117 (H) 08/13/2022   LDLCALC 86 03/01/2021   Lab Results  Component Value Date   TSH 2.130 05/16/2022   TSH 4.88 08/09/2021    Therapeutic Level Labs: No results found for: "LITHIUM" No results found for: "VALPROATE" No results found for: "CBMZ"  Current Medications: Current Outpatient Medications  Medication Sig Dispense Refill   amphetamine-dextroamphetamine (ADDERALL XR) 30 MG 24 hr capsule Take 30 mg by mouth every morning.     Blood Glucose Monitoring Suppl DEVI 1 each by Does not apply route in the morning, at noon, and at bedtime. May  substitute to any manufacturer covered by patient's insurance. 1 each 0   cariprazine (VRAYLAR) 3 MG capsule Take 1 capsule (3 mg total) by mouth daily. 30 capsule 1   carvedilol (COREG) 25 MG tablet TAKE 1 TABLET (25 MG TOTAL) BY MOUTH TWICE A DAY WITH MEALS 180 tablet 3   Dapagliflozin Pro-metFORMIN ER (XIGDUO XR) 11-998 MG TB24 Take 1 tablet  by mouth daily. 90 tablet 0   escitalopram (LEXAPRO) 10 MG tablet Take 1 tablet (10 mg total) by mouth daily. 90 tablet 1   fluconazole (DIFLUCAN) 150 MG tablet Take 1 tablet (150 mg total) by mouth daily. 1 tablet 0   furosemide (LASIX) 80 MG tablet TAKE 1 TABLET BY MOUTH EVERY DAY 90 tablet 3   gabapentin (NEURONTIN) 100 MG capsule Take 1 capsule (100 mg total) by mouth 3 (three) times daily. 90 capsule 1   Glucose Blood (BLOOD GLUCOSE TEST STRIPS) STRP 1 each by In Vitro route in the morning, at noon, and at bedtime. May substitute to any manufacturer covered by patient's insurance. 100 strip 11   Lancet Device MISC 1 each by Does not apply route in the morning, at noon, and at bedtime. May substitute to any manufacturer covered by patient's insurance. 1 each 0   Lancets Misc. MISC 1 each by Does not apply route in the morning, at noon, and at bedtime. May substitute to any manufacturer covered by patient's insurance. 100 each 11   linaclotide (LINZESS) 145 MCG CAPS capsule Take 1 capsule (145 mcg total) by mouth daily before breakfast. 90 capsule 1   methylphenidate (RITALIN) 5 MG tablet Take 1 tablet (5 mg total) by mouth 2 (two) times daily. 60 tablet 0   metroNIDAZOLE (METROGEL) 1 % gel Apply topically daily. 45 g 2   Misc. Devices (FOAM CUSHION THERAPEUTIC RING) MISC Use while sitting 1 each 0   NON FORMULARY Pt uses cpap nightly     potassium chloride SA (KLOR-CON M20) 20 MEQ tablet TAKE 2 TABLETS BY MOUTH DAILY 180 tablet 2   pregabalin (LYRICA) 75 MG capsule Take 1 capsule (75 mg total) by mouth 2 (two) times daily as needed. 60 capsule 3    sacubitril-valsartan (ENTRESTO) 97-103 MG Take 1 tablet by mouth 2 (two) times daily. 180 tablet 2   simvastatin (ZOCOR) 20 MG tablet TAKE 1 TABLET BY MOUTH EVERY DAY 90 tablet 0   tirzepatide (MOUNJARO) 2.5 MG/0.5ML Pen INJECT 2.5 MG SUBCUTANEOUSLY WEEKLY 2 mL 0   tiZANidine (ZANAFLEX) 2 MG tablet TAKE 1 TABLET BY MOUTH EVERY 8 HOURS AS NEEDED FOR MUSCLE SPASMS. 270 tablet 0   No current facility-administered medications for this visit.     Musculoskeletal: Strength & Muscle Tone: within normal limits Gait & Station: ataxic Patient leans: N/A  Psychiatric Specialty Exam: Review of Systems  Psychiatric/Behavioral:  Positive for decreased concentration, dysphoric mood and sleep disturbance. Negative for hallucinations, self-injury and suicidal ideas. The patient is nervous/anxious. The patient is not hyperactive.     Blood pressure (!) 139/99, pulse 80, temperature 98.3 F (36.8 C), temperature source Oral, height 5\' 4"  (1.626 m), weight 298 lb 9.6 oz (135.4 kg), SpO2 95%.Body mass index is 51.25 kg/m.  General Appearance: Casual  Eye Contact:  Good  Speech:  Clear and Coherent and Normal Rate  Volume:  Normal  Mood:  Anxious and Depressed  Affect:  Congruent  Thought Process:  Coherent, Goal Directed, and Descriptions of Associations: Intact  Orientation:  Full (Time, Place, and Person)  Thought Content: WDL   Suicidal Thoughts:  No  Homicidal Thoughts:  No  Memory:  Immediate;   Good Recent;   Good Remote;   Fair  Judgement:  Good  Insight:  Fair  Psychomotor Activity:  Normal  Concentration:  Concentration: Fair and Attention Span: Fair  Recall:  Good  Fund of Knowledge: Good  Language: Good  Akathisia:  No  Handed:  Right  AIMS (if indicated): not done  Assets:  Communication Skills Desire for Improvement Financial Resources/Insurance Housing Social Support Transportation  ADL's:  Intact  Cognition: WNL  Sleep:  Poor   Screenings: AUDIT    Flowsheet Row  [facility]  Alcohol Use Disorder Identification Test Final Score (AUDIT) 0      GAD-7    Flowsheet Row Clinical Support from 12/05/2022 in [facility] Office Visit from 10/24/2022 in [facility] Office Visit from 06/19/2022 in [facility] Office Visit from 05/02/2022 in [facility] Office Visit from 08/01/2021 in [facility] [place]  Total GAD-7 Score 16 14 17 17 15       PHQ2-9    Flowsheet Row Clinical Support from 12/05/2022 in [facility] Care Coordination from 11/12/2022 in [facility] Office Visit from 10/24/2022 in [facility] Office Visit from 06/19/2022 in [facility] Office Visit from 05/02/2022 in [facility]  PHQ-2 Total Score 6 3 6 6 6   PHQ-9 Total Score 21 10 23 21 20       Flowsheet Row Clinical Support from 12/05/2022 in [facility] Care Coordination from 11/12/2022 in [facility] Office Visit from 10/24/2022 in [facility]  C-SSRS RISK CATEGORY Low Risk Error: Q3, 4, or 5 should not be populated when Q2 is No Low Risk        Assessment and Plan:   Xoie Kreuser is a 57 year old female with a past psychiatric history significant for 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  Patient presents to the encounter stating that her  Ritalin old due to her insurance.  Provider informed patient that her Ritalin most likely has to be authorized by this facility before she can start taking the medication.  Patient vocalized understanding.  Patient continues to endorse worsening depression and anxiety attributed to stressors related to her car recently breaking down and the current political landscape.  In addition to authorizing patient's Ritalin, provider recommended increasing her Vraylar from 1.5 mg to 3 mg daily for the management of her depression.  Patient is agreeable to recommendation.  Patient's medications to be e-prescribed to pharmacy of choice.  Collaboration of Care: Collaboration of Care: Medication Management AEB provider managing patient's psychiatric medications, Primary Care Provider AEB patient being seen by internal medicine, Psychiatrist AEB patient being followed by mental health provider, and Other provider involved in patient's care AEB patient being seen by gastroenterology  Patient/Guardian was advised Release of Information must be obtained prior to any record release in order to collaborate their care with an outside provider. Patient/Guardian was advised if they have not already done so to contact the registration department to sign all necessary forms in order for [place] to release information regarding their care.   Consent: Patient/Guardian gives verbal consent for treatment and assignment of benefits for services provided during this visit. Patient/Guardian expressed understanding and agreed to proceed.   1. Major depressive disorder, recurrent episode, severe with anxious  distress (HCC)  - escitalopram (LEXAPRO) 10 MG tablet; Take 1 tablet (10 mg total) by mouth daily.  Dispense: 90 tablet; Refill: 1 - cariprazine (VRAYLAR) 3 MG capsule; Take 1 capsule (3 mg total) by mouth daily.  Dispense: 30 capsule; Refill: 1  2. Anxiety  - escitalopram (LEXAPRO) 10 MG tablet; Take 1 tablet (10 mg total) by mouth  daily.  Dispense: 90 tablet; Refill: 1 - gabapentin (NEURONTIN) 100 MG capsule; Take 1 capsule (100 mg total) by mouth 3 (three) times daily.  Dispense: 90 capsule; Refill: 1  3. ADD (attention deficit disorder) without hyperactivity  - methylphenidate (RITALIN) 5 MG tablet; Take 1 tablet (5 mg total) by mouth 2 (two) times daily.  Dispense: 60 tablet; Refill: 0  Patient to follow up in 6 weeks Provider spent a total of 43 minutes with the patient/reviewing patient's chart  Meta Hatchet, PA 12/05/2022, 6:07 PM
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
## 2022-12-05 NOTE — Progress Notes (Signed)
Pt transferred from TRA-A to 2H26 with no complications.

## 2022-12-05 NOTE — Progress Notes (Incomplete)
eLink Physician-Brief Progress Note Patient Name: [name] DOB: 01/16/1966 MRN: [id number]   Date of Service  12/05/2022  HPI/Events of Note  57 year old female with a history of nonischemic cardiomyopathy with heart failure with reduced ejection fraction of 30% that was brought in by out-of-hospital cardiac arrest with prolonged ACLS for 40 minutes with intermittent ROSC, 6 shocks and she was intubated in the emergency department.  On examination, she is tachypneic, hypertensive, saturating 98% on 100% FiO2.  Ongoing amiodarone, norepinephrine and propofol.  CPAP positive requiring fentanyl.  Initial results show severe mixed acidosis.  Metabolic panel with anion gap metabolic acidosis, elevated creatinine, transaminitis, and lactic acidosis.  Leukocytosis and polycythemia present.  Hyperglycemia to 285.  CT head with concern for right frontal hypodensities.  CT chest angiography unremarkable.  Enteric tube in appropriate positioning.  eICU Interventions  Maintain norepinephrine as needed for MAP greater than 65    DVT prophylaxis with SCDs GI prophylaxis with famotidine     Intervention Category Evaluation Type: New Patient Evaluation  Jlon Betker 12/05/2022, 11:24 PM

## 2022-12-06 ENCOUNTER — Encounter: Payer: Self-pay | Admitting: Podiatry

## 2022-12-06 ENCOUNTER — Encounter (HOSPITAL_COMMUNITY): Payer: Self-pay | Admitting: Pulmonary Disease

## 2022-12-06 ENCOUNTER — Inpatient Hospital Stay (HOSPITAL_COMMUNITY): Payer: Medicare Other

## 2022-12-06 ENCOUNTER — Other Ambulatory Visit: Payer: Self-pay

## 2022-12-06 DIAGNOSIS — I469 Cardiac arrest, cause unspecified: Secondary | ICD-10-CM | POA: Diagnosis not present

## 2022-12-06 LAB — GLUCOSE, CAPILLARY
Glucose-Capillary: 154 mg/dL — ABNORMAL HIGH (ref 70–99)
Glucose-Capillary: 160 mg/dL — ABNORMAL HIGH (ref 70–99)
Glucose-Capillary: 165 mg/dL — ABNORMAL HIGH (ref 70–99)
Glucose-Capillary: 167 mg/dL — ABNORMAL HIGH (ref 70–99)
Glucose-Capillary: 178 mg/dL — ABNORMAL HIGH (ref 70–99)
Glucose-Capillary: 183 mg/dL — ABNORMAL HIGH (ref 70–99)
Glucose-Capillary: 203 mg/dL — ABNORMAL HIGH (ref 70–99)

## 2022-12-06 LAB — RAPID URINE DRUG SCREEN, HOSP PERFORMED
Amphetamines: NOT DETECTED
Barbiturates: NOT DETECTED
Benzodiazepines: NOT DETECTED
Cocaine: NOT DETECTED
Opiates: NOT DETECTED
Tetrahydrocannabinol: NOT DETECTED

## 2022-12-06 LAB — TROPONIN I (HIGH SENSITIVITY)
Troponin I (High Sensitivity): 1025 ng/L (ref <18)
Troponin I (High Sensitivity): 2849 ng/L (ref <18)
Troponin I (High Sensitivity): 3234 ng/L (ref <18)
Troponin I (High Sensitivity): 636 ng/L (ref <18)
Troponin I (High Sensitivity): 809 ng/L (ref <18)

## 2022-12-06 LAB — BASIC METABOLIC PANEL
Anion gap: 16 — ABNORMAL HIGH (ref 5–15)
BUN: 25 mg/dL — ABNORMAL HIGH (ref 6–20)
CO2: 16 mmol/L — ABNORMAL LOW (ref 22–32)
Calcium: 8.9 mg/dL (ref 8.9–10.3)
Chloride: 106 mmol/L (ref 98–111)
Creatinine, Ser: 1.52 mg/dL — ABNORMAL HIGH (ref 0.44–1.00)
GFR, Estimated: 40 mL/min — ABNORMAL LOW (ref >60)
Glucose, Bld: 211 mg/dL — ABNORMAL HIGH (ref 70–99)
Potassium: 4.5 mmol/L (ref 3.5–5.1)
Sodium: 138 mmol/L (ref 135–145)

## 2022-12-06 LAB — GASTROINTESTINAL PANEL BY PCR, STOOL (REPLACES STOOL CULTURE)

## 2022-12-06 LAB — BLOOD GAS, ARTERIAL
Acid-base deficit: 2.4 mmol/L — ABNORMAL HIGH (ref 0.0–2.0)
Bicarbonate: 21.7 mmol/L (ref 20.0–28.0)
Drawn by: 6928
O2 Saturation: 97.7 %
Patient temperature: 37
pCO2 arterial: 35 mm[Hg] (ref 32–48)
pH, Arterial: 7.4 (ref 7.35–7.45)
pO2, Arterial: 96 mm[Hg] (ref 83–108)

## 2022-12-06 LAB — CBC
HCT: 50.9 % — ABNORMAL HIGH (ref 36.0–46.0)
Hemoglobin: 16.1 g/dL — ABNORMAL HIGH (ref 12.0–15.0)
MCH: 27.9 pg (ref 26.0–34.0)
MCHC: 31.6 g/dL (ref 30.0–36.0)
MCV: 88.2 fL (ref 80.0–100.0)
Platelets: 352 10*3/uL (ref 150–400)
RBC: 5.77 MIL/uL — ABNORMAL HIGH (ref 3.87–5.11)
RDW: 15 % (ref 11.5–15.5)
WBC: 20.5 10*3/uL — ABNORMAL HIGH (ref 4.0–10.5)
nRBC: 0 % (ref 0.0–0.2)

## 2022-12-06 LAB — MAGNESIUM
Magnesium: 2.3 mg/dL (ref 1.7–2.4)
Magnesium: 2.7 mg/dL — ABNORMAL HIGH (ref 1.7–2.4)

## 2022-12-06 LAB — HEPATIC FUNCTION PANEL
ALT: 239 U/L — ABNORMAL HIGH (ref 0–44)
AST: 248 U/L — ABNORMAL HIGH (ref 15–41)
Albumin: 3.2 g/dL — ABNORMAL LOW (ref 3.5–5.0)
Alkaline Phosphatase: 42 U/L (ref 38–126)
Bilirubin, Direct: 0.3 mg/dL — ABNORMAL HIGH (ref 0.0–0.2)
Indirect Bilirubin: 0.9 mg/dL (ref 0.3–0.9)
Total Bilirubin: 1.2 mg/dL (ref 0.3–1.2)
Total Protein: 7 g/dL (ref 6.5–8.1)

## 2022-12-06 LAB — POCT I-STAT 7, (LYTES, BLD GAS, ICA,H+H)
Acid-base deficit: 6 mmol/L — ABNORMAL HIGH (ref 0.0–2.0)
Bicarbonate: 19.6 mmol/L — ABNORMAL LOW (ref 20.0–28.0)
Calcium, Ion: 1.14 mmol/L — ABNORMAL LOW (ref 1.15–1.40)
HCT: 49 % — ABNORMAL HIGH (ref 36.0–46.0)
Hemoglobin: 16.7 g/dL — ABNORMAL HIGH (ref 12.0–15.0)
O2 Saturation: 100 %
Patient temperature: 37.6
Potassium: 4.1 mmol/L (ref 3.5–5.1)
Sodium: 133 mmol/L — ABNORMAL LOW (ref 135–145)
TCO2: 21 mmol/L — ABNORMAL LOW (ref 22–32)
pCO2 arterial: 40 mm[Hg] (ref 32–48)
pH, Arterial: 7.3 — ABNORMAL LOW (ref 7.35–7.45)
pO2, Arterial: 195 mm[Hg] — ABNORMAL HIGH (ref 83–108)

## 2022-12-06 LAB — ETHANOL: Alcohol, Ethyl (B): <10 mg/dL (ref <10)

## 2022-12-06 LAB — PHOSPHORUS: Phosphorus: 6.2 mg/dL — ABNORMAL HIGH (ref 2.5–4.6)

## 2022-12-06 LAB — COOXEMETRY PANEL
Carboxyhemoglobin: 1.4 % (ref 0.5–1.5)
Methemoglobin: <0.7 % (ref 0.0–1.5)
O2 Saturation: 74.2 %
Total hemoglobin: 16.1 g/dL — ABNORMAL HIGH (ref 12.0–16.0)

## 2022-12-06 LAB — SALICYLATE LEVEL: Salicylate Lvl: <7 mg/dL — ABNORMAL LOW (ref 7.0–30.0)

## 2022-12-06 LAB — LACTIC ACID, PLASMA: Lactic Acid, Venous: 2.5 mmol/L (ref 0.5–1.9)

## 2022-12-06 LAB — ACETAMINOPHEN LEVEL: Acetaminophen (Tylenol), Serum: <10 ug/mL — ABNORMAL LOW (ref 10–30)

## 2022-12-06 LAB — C DIFFICILE QUICK SCREEN W PCR REFLEX
C Diff antigen: NEGATIVE
C Diff interpretation: NOT DETECTED
C Diff toxin: NEGATIVE

## 2022-12-06 LAB — MRSA NEXT GEN BY PCR, NASAL: MRSA by PCR Next Gen: NOT DETECTED

## 2022-12-06 LAB — TRIGLYCERIDES: Triglycerides: 208 mg/dL — ABNORMAL HIGH (ref <150)

## 2022-12-06 MED ORDER — MIDAZOLAM HCL 2 MG/2ML IJ SOLN: 2.0000 mg | Freq: Once | INTRAMUSCULAR | Status: AC

## 2022-12-06 MED ORDER — SODIUM CHLORIDE 0.9 % IV SOLN
2.0000 g | INTRAVENOUS | Status: DC
  Administered 2022-12-06 – 2022-12-12 (×7): 2 g via INTRAVENOUS
  Filled 2022-12-06 (×7): qty 20

## 2022-12-06 MED ORDER — ACETAMINOPHEN 650 MG RE SUPP: 650.0000 mg | RECTAL | Status: DC | PRN

## 2022-12-06 MED ORDER — CHLORHEXIDINE GLUCONATE CLOTH 2 % EX PADS
6.0000 | MEDICATED_PAD | Freq: Every day | CUTANEOUS | Status: DC
  Administered 2022-12-06 – 2022-12-17 (×11): 6 via TOPICAL

## 2022-12-06 MED ORDER — ORAL CARE MOUTH RINSE
15.0000 mL | OROMUCOSAL | Status: DC
  Administered 2022-12-06 – 2022-12-07 (×20): 15 mL via OROMUCOSAL

## 2022-12-06 MED ORDER — MIDAZOLAM HCL 2 MG/2ML IJ SOLN
INTRAMUSCULAR | Status: AC
  Administered 2022-12-06: 2 mg via INTRAVENOUS
  Filled 2022-12-06: qty 2

## 2022-12-06 MED ORDER — ORAL CARE MOUTH RINSE: 15.0000 mL | OROMUCOSAL | Status: DC | PRN

## 2022-12-06 MED ORDER — NOREPINEPHRINE 4 MG/250ML-% IV SOLN
0.0000 ug/min | INTRAVENOUS | Status: DC
  Administered 2022-12-06: 16 ug/min via INTRAVENOUS
  Administered 2022-12-06: 15.013 ug/min via INTRAVENOUS
  Administered 2022-12-06: 10 ug/min via INTRAVENOUS
  Administered 2022-12-06: 13 ug/min via INTRAVENOUS
  Administered 2022-12-06: 12 ug/min via INTRAVENOUS
  Administered 2022-12-07: 6 ug/min via INTRAVENOUS
  Filled 2022-12-06 (×5): qty 250

## 2022-12-06 MED ORDER — DEXMEDETOMIDINE HCL IN NACL 400 MCG/100ML IV SOLN
0.0000 ug/kg/h | INTRAVENOUS | Status: DC
  Administered 2022-12-06: 0.3 ug/kg/h via INTRAVENOUS
  Administered 2022-12-06: 0.4 ug/kg/h via INTRAVENOUS
  Administered 2022-12-06: 0.7 ug/kg/h via INTRAVENOUS
  Administered 2022-12-07: 0.5 ug/kg/h via INTRAVENOUS
  Administered 2022-12-07 (×2): 0.7 ug/kg/h via INTRAVENOUS
  Filled 2022-12-06 (×6): qty 100

## 2022-12-06 MED ORDER — HEPARIN SODIUM (PORCINE) 5000 UNIT/ML IJ SOLN
5000.0000 [IU] | Freq: Three times a day (TID) | INTRAMUSCULAR | Status: DC
  Administered 2022-12-06 – 2022-12-17 (×34): 5000 [IU] via SUBCUTANEOUS
  Filled 2022-12-06 (×34): qty 1

## 2022-12-06 MED ORDER — ACETAMINOPHEN 325 MG PO TABS
650.0000 mg | ORAL_TABLET | ORAL | Status: DC | PRN
  Administered 2022-12-07 – 2022-12-17 (×9): 650 mg via ORAL
  Filled 2022-12-06 (×10): qty 2

## 2022-12-06 MED ORDER — ASPIRIN 81 MG PO CHEW
81.0000 mg | CHEWABLE_TABLET | Freq: Every day | ORAL | Status: DC
  Administered 2022-12-06 – 2022-12-07 (×2): 81 mg
  Filled 2022-12-06 (×2): qty 1

## 2022-12-06 MED ORDER — VASOPRESSIN 20 UNITS/100 ML INFUSION FOR SHOCK
0.0400 [IU]/min | INTRAVENOUS | Status: DC
  Administered 2022-12-06: 0.02 [IU]/min via INTRAVENOUS
  Administered 2022-12-06 (×2): 0.04 [IU]/min via INTRAVENOUS
  Filled 2022-12-06 (×3): qty 100

## 2022-12-06 MED ORDER — ACETAMINOPHEN 160 MG/5ML PO SOLN
650.0000 mg | ORAL | Status: DC | PRN
  Administered 2022-12-06 – 2022-12-07 (×5): 650 mg
  Filled 2022-12-06 (×5): qty 20.3

## 2022-12-06 NOTE — Procedures (Signed)
Central Venous Catheter Insertion Procedure Note  [name]  [id number]  December 26, 1965  Date:12/06/22  Time:3:39 PM   Provider Performing:Knoah Nedeau   Procedure: Insertion of Non-tunneled Central Venous [phone]) with US guidance ([id number])   Indication(s) Difficult access  Consent Risks of the procedure as well as the alternatives and risks of each were explained to the patient and/or caregiver.  Consent for the procedure was obtained and is signed in the bedside chart  Anesthesia Topical only with 1% lidocaine   Timeout Verified patient identification, verified procedure, site/side was marked, verified correct patient position, special equipment/implants available, medications/allergies/relevant history reviewed, required imaging and test results available.  Sterile Technique Maximal sterile technique including full sterile barrier drape, hand hygiene, sterile gown, sterile gloves, mask, hair covering, sterile ultrasound probe cover (if used).  Procedure Description Area of catheter insertion was cleaned with chlorhexidine and draped in sterile fashion.  With real-time ultrasound guidance a central venous catheter was placed into the left internal jugular vein. Nonpulsatile blood flow and easy flushing noted in all ports.  The catheter was sutured in place and sterile dressing applied.  Complications/Tolerance None; patient tolerated the procedure well. Chest X-ray is ordered to verify placement for internal jugular or subclavian cannulation.   Chest x-ray is not ordered for femoral cannulation.  EBL Minimal  Specimen(s) None   Lynnell Catalan, MD [facility] ICU Physician [facility] [place]  Pager: [phone] Or Epic Secure Chat After hours: [phone].  12/06/2022, 3:39 PM

## 2022-12-06 NOTE — Procedures (Signed)
Arterial Line Insertion Start/End10/12/2022 3:25 PM, 12/06/2022 3:35 PM  Patient location: ICU. Preanesthetic checklist: patient identified, monitors and equipment checked and timeout performed Right, radial was placed Catheter size: 20 G Hand hygiene performed  and maximum sterile barriers used  Allen's test indicative of satisfactory collateral circulation Attempts: 2 Procedure performed without using ultrasound guided technique. Following insertion, dressing applied and Biopatch. Post procedure assessment: normal  Post procedure complications: unsuccessful attempts. Patient tolerated the procedure well with no immediate complications.

## 2022-12-06 NOTE — H&P (Addendum)
NAME:  Dezirea [name], MRN:  [id number], DOB:  04/10/1965, LOS: 1 ADMISSION DATE:  12/05/2022, CONSULTATION DATE: 10/10 REFERRING MD: Dr. Particia Nearing, CHIEF COMPLAINT: Cardiac arrest  History of Present Illness:  57 year old female with past medical history as below, which is significant for hypertension, hyperlipidemia, CHF with EF 30 to 35% on GDMT, cardiac cath in 2018 showed no obstruction, DM, and morbid obesity who presented to [facility] emergency department on 10/10 after suffering cardiac arrest.  Patient was at home with her husband when she suffered a witnessed cardiac arrest.  She did have bystander CPR.  Upon EMS arrival she was found to be in VT/VF and ACLS was administered over over several arrest periods totaling up to 40 minutes.  Each arrest was VT/VF.  The patient received 6 defibrillations, 9 epi pushes, IV magnesium, and 2 doses of amiodarone.  Upon arrival to the emergency department she was intubated for airway protection.  She was evaluated by cardiology in the ED and PCCM was asked to evaluate for admission.  Pertinent  Medical History   Past Medical History:  Diagnosis Date   ADD (attention deficit disorder)    Anxiety    Arthritis    both knees right worse than left   Carpal tunnel syndrome of right wrist    Depression    Essential hypertension, benign    Gallstones    History of smoking 06/22/2016   Hyperlipidemia associated with type 2 diabetes mellitus (HCC), on Zocor 04/04/2011   Hypertension associated with diabetes (HCC) 06/03/2008   Migraines    Morbid obesity (HCC)    Morbid obesity with BMI of 50.0-59.9, adult (HCC) 07/12/2006   NICM (nonischemic cardiomyopathy) (HCC) 07/12/2006   01/16/18 ECHO:    - Procedure narrative: Transthoracic echocardiography. Image   quality was suboptimal. The study was technically difficult.   Intravenous contrast (Definity) was administered. - Left ventricle: The cavity size was moderately dilated. Wall   thickness was increased in a  pattern of mild LVH. Systolic   function was moderately to severely reduced. The estimated   ejection fraction w   Normal coronary arteries 04/17/2016   OSA on CPAP 10/28/2014   Spinal stenosis    back pain   Spondylosis without myelopathy or radiculopathy, lumbar region 12/04/2017   Type 2 diabetes mellitus with hyperglycemia [facility])    Past Surgical History:  Procedure Laterality Date   CHOLECYSTECTOMY     DILATATION & CURETTAGE/HYSTEROSCOPY WITH MYOSURE N/A 10/31/2017   Procedure: DILATATION & CURETTAGE/HYSTEROSCOPY WITH MYOSURE;  Surgeon: Romualdo Bolk, MD;  Location: [facility] ORS;  Service: Gynecology;  Laterality: N/A;   HYSTEROSCOPY WITH D & C N/A 05/07/2022   Procedure: DILATATION AND CURETTAGE /HYSTEROSCOPY;  Surgeon: [name], MD;  Location: [facility] OR;  Service: Gynecology;  Laterality: N/A;   RIGHT/LEFT HEART CATH AND CORONARY ANGIOGRAPHY N/A 04/11/2016   Procedure: Right/Left Heart Cath and Coronary Angiography;  Surgeon: Kathleene Hazel, MD;  Location: [facility] INVASIVE CV LAB;  Service: Cardiovascular;  Laterality: N/A;   sonogram for blood clots     no blockages      Significant Hospital Events: Including procedures, antibiotic start and stop dates in addition to other pertinent events   10/10 admitted s/p VF arrest  Interim History / Subjective:   With sedation interrupted, the patient is following commands.   Objective   Blood pressure (!) 85/71, pulse 80, temperature (!) 101.5 F (38.6 C), resp. rate (!) 28, weight (!) 138.1 kg, SpO2 97%.  Vent Mode: PRVC FiO2 (%):  [40 %-100 %] 40 % Set Rate:  [18 bmp-28 bmp] 28 bmp Vt Set:  [460 mL] 460 mL PEEP:  [8 cmH20] 8 cmH20 Plateau Pressure:  [21 cmH20-28 cmH20] 21 cmH20   Intake/Output Summary (Last 24 hours) at 12/06/2022 1054 Last data filed at 12/06/2022 0900 Gross per 24 hour  Intake 1288.7 ml  Output 355 ml  Net 933.7 ml   Filed Weights   12/06/22 0547 12/06/22 0554  Weight: (S) (!) 138 kg (!) 138.1  kg   Examination: General: Morbidly obese female HENT: [place]/AT, PERRL, unable to appreciate JVD Lungs: Distant, good air entry bilaterally.  Cardiovascular: RRR, no MRG Abdomen: Soft, Non-distended Extremities: No acute deformity or significant edema.  Mild blanching around site of I/O. Neuro: Off sedation, the patient follows commands in all 4 extremities.  She is attempting to talk around the tube and is perseverating. GU: Foley making urine  Ancillary test personally reviewed:  Creatinine 1.52 Mildly elevated transaminases Elevated troponin demand related pattern Leukocytosis 20.5 Erythrocytosis 16.1 Negative UDS CT head negative.  Assessment & Plan:   VF cardiac arrest on background of HFrEF with known EF 35%. 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 Acute hypoxic respiratory failure Morbid obesity History of type 2 diabetes History of OSA on CPAP History of hypertension History of dyslipidemia.  Plan:  -Minimize sedation.  No need for normothermia as now following commands. - SBT and possible extubation.  -Titrate norepinephrine/vasopressin to keep MAP greater than 65 wean vasopressin off preferentially. If can most of the way off pressors, may be appropriate for extubation.  - Follow up echocardiogram.  Recheck lactate, if has cleared doubtful patient has ongoing shock and need ongoing goal-directed HF management.  -Acceptable glycemic control on insulin sliding scale.  Best Practice (right click and "Reselect all SmartList Selections" daily)   Diet/type: NPO DVT prophylaxis: SCD - pork allergy GI prophylaxis: H2B Lines: N/A Foley:  Yes, and it is still needed Code Status:  full code Last date of multidisciplinary goals of care discussion [ ]  Unable to contact husband via  phone  CRITICAL CARE Performed by: Lynnell Catalan   Total critical care time: 35 minutes  Critical care time was exclusive of separately billable procedures and treating other patients.  Critical care was necessary to treat or prevent imminent or life-threatening deterioration.  Critical care was time spent personally by me on the following activities: development of treatment plan with patient and/or surrogate as well as nursing, discussions with consultants, evaluation of patient's response to treatment, examination of patient, obtaining history from patient or surrogate, ordering and performing treatments and interventions, ordering and review of laboratory studies, ordering and review of radiographic studies, pulse oximetry, re-evaluation of patient's condition and participation in multidisciplinary rounds.  Lynnell Catalan, MD [facility] ICU Physician [facility] [place] [facility]  Pager: [phone] Mobile: [phone] After hours: [phone].

## 2022-12-06 NOTE — Plan of Care (Signed)

## 2022-12-06 NOTE — Progress Notes (Signed)
DAILY PROGRESS NOTE   Patient Name: Nazia Rhines Date of Encounter: 12/06/2022 Cardiologist: [name], MD  Chief Complaint   Intubated, sedated on vent  Patient Profile   Ireene Ballowe is a 57 y.o. female with a hx of OSA, diastolic heart failure, morbid obesity, nonischemic CM EF 30-35%, significant depression who is being seen 12/05/2022 after cardiac arrest 2/2 VF   Subjective   Consult note reviewed overnight - VF arrest, eventually after a long code she had ROSC. Has a histroy of NICM with LVEF 30-35% in 2023, however, her LVEF had improved to 50-55% in 2020 - did not have an AICD. Overnight noted to have some AIVR. Troponin up to 3234. Lactate was >9 initially, now 2.5 today. On amiodarone, pressors - MAP 76.  Objective   Vitals:   12/06/22 0630 12/06/22 0645 12/06/22 0700 12/06/22 0718  BP: (!) 80/66 (!) 83/66 (!) 87/71 (!) 85/71  Pulse: 82 82 82 80  Resp: (!) 28 (!) 28 (!) 28 (!) 28  Temp: (!) 101.8 F (38.8 C) (!) 101.8 F (38.8 C) (!) 101.7 F (38.7 C) (!) 101.5 F (38.6 C)  TempSrc:      SpO2: 97% 97% 97% 97%  Weight:        Intake/Output Summary (Last 24 hours) at 12/06/2022 7829 Last data filed at 12/06/2022 0700 Gross per 24 hour  Intake 1028.92 ml  Output 310 ml  Net 718.92 ml   Filed Weights   12/06/22 0547 12/06/22 0554  Weight: (S) (!) 138 kg (!) 138.1 kg    Physical Exam   General appearance: intubated, sedated on vent, moving extremities with spontaneous eye opening Neck: JVD - a few cm above sternal notch, no carotid bruit, and thyroid not enlarged, symmetric, no tenderness/mass/nodules Lungs: diminished breath sounds bilaterally Heart: regular rate and rhythm Abdomen: soft, non-tender; bowel sounds normal; no masses,  no organomegaly and obese Extremities: extremities normal, atraumatic, no cyanosis or edema Pulses: 2+ and symmetric Skin: Skin color, texture, turgor normal. No rashes or lesions Neurologic: Mental status:  intubated, sedated on vent, not clearly following commands, but does intermittently open eyes and move non-purposefully Psych: Cannot assess  Inpatient Medications    Scheduled Meds:  Chlorhexidine Gluconate Cloth  6 each Topical Daily   famotidine  20 mg Per Tube BID   insulin aspart  0-20 Units Subcutaneous Q4H   mouth rinse  15 mL Mouth Rinse Q2H   sodium chloride flush  10 mL Intravenous Q12H    Continuous Infusions:  amiodarone 30 mg/hr (12/06/22 0700)   cefTRIAXone (ROCEPHIN)  IV     norepinephrine (LEVOPHED) Adult infusion 15.013 mcg/min (12/06/22 0830)   propofol (DIPRIVAN) infusion 30 mcg/kg/min (12/06/22 0700)   vasopressin 0.04 Units/min (12/06/22 0700)    PRN Meds: acetaminophen **OR** acetaminophen (TYLENOL) oral liquid 160 mg/5 mL **OR** acetaminophen, fentaNYL (SUBLIMAZE) injection, mouth rinse   Labs   Results for orders placed or performed during the hospital encounter of 12/05/22 (from the past 48 hour(s))  CBG monitoring, ED     Status: Abnormal   Collection Time: 12/05/22  6:18 PM  Result Value Ref Range   Glucose-Capillary 270 (H) 70 - 99 mg/dL    Comment: Glucose reference range applies only to samples taken after fasting for at least 8 hours.  CBC     Status: Abnormal   Collection Time: 12/05/22  6:25 PM  Result Value Ref Range   WBC 15.8 (H) 4.0 - 10.5 K/uL   RBC  5.55 (H) 3.87 - 5.11 MIL/uL   Hemoglobin 15.9 (H) 12.0 - 15.0 g/dL   HCT 16.1 (H) 09.6 - 04.5 %   MCV 93.2 80.0 - 100.0 fL   MCH 28.6 26.0 - 34.0 pg   MCHC 30.8 30.0 - 36.0 g/dL   RDW 40.9 81.1 - 91.4 %   Platelets 246 150 - 400 K/uL   nRBC 0.0 0.0 - 0.2 %    Comment: Performed at [facility], [address], [place], [place] [address]  Troponin I (High Sensitivity)     Status: Abnormal   Collection Time: 12/05/22  6:25 PM  Result Value Ref Range   Troponin I (High Sensitivity) 89 (H) <18 ng/L    Comment: (NOTE) Elevated high sensitivity troponin I (hsTnI) values and  significant  changes across serial measurements may suggest ACS but many other  chronic and acute conditions are known to elevate hsTnI results.  Refer to the "Links" section for chest pain algorithms and additional  guidance. Performed at [facility], [address] [place], [place] [address]   Comprehensive metabolic panel     Status: Abnormal   Collection Time: 12/05/22  6:25 PM  Result Value Ref Range   Sodium 142 135 - 145 mmol/L   Potassium 3.7 3.5 - 5.1 mmol/L   Chloride 100 98 - 111 mmol/L   CO2 15 (L) 22 - 32 mmol/L   Glucose, Bld 289 (H) 70 - 99 mg/dL    Comment: Glucose reference range applies only to samples taken after fasting for at least 8 hours.   BUN 17 6 - 20 mg/dL   Creatinine, Ser 8.65 (H) 0.44 - 1.00 mg/dL   Calcium 9.6 8.9 - 78.4 mg/dL   Total Protein 6.3 (L) 6.5 - 8.1 g/dL   Albumin 3.1 (L) 3.5 - 5.0 g/dL   AST 696 (H) 15 - 41 U/L   ALT 203 (H) 0 - 44 U/L    Comment: RESULT CONFIRMED BY MANUAL DILUTION   Alkaline Phosphatase 42 38 - 126 U/L   Total Bilirubin 0.9 0.3 - 1.2 mg/dL   GFR, Estimated 46 (L) >60 mL/min    Comment: (NOTE) Calculated using the CKD-EPI Creatinine Equation (2021)    Anion gap 27 (H) 5 - 15    Comment: ELECTROLYTES REPEATED TO VERIFY Performed at [facility], [address], [place], [place] [address]   I-stat chem 8, ED     Status: Abnormal   Collection Time: 12/05/22  6:27 PM  Result Value Ref Range   Sodium 140 135 - 145 mmol/L   Potassium 3.4 (L) 3.5 - 5.1 mmol/L   Chloride 104 98 - 111 mmol/L   BUN 21 (H) 6 - 20 mg/dL   Creatinine, Ser 4.13 (H) 0.44 - 1.00 mg/dL   Glucose, Bld 244 (H) 70 - 99 mg/dL    Comment: Glucose reference range applies only to samples taken after fasting for at least 8 hours.   Calcium, Ion 1.11 (L) 1.15 - 1.40 mmol/L   TCO2 19 (L) 22 - 32 mmol/L   Hemoglobin 17.3 (H) 12.0 - 15.0 g/dL   HCT 01.0 (H) 27.2 - 53.6 %  I-Stat venous blood gas, ([facility] ED, [facility], [facility])     Status: Abnormal    Collection Time: 12/05/22  6:27 PM  Result Value Ref Range   pH, Ven 7.049 (LL) 7.25 - 7.43   pCO2, Ven 59.4 44 - 60 mmHg   pO2, Ven  44 32 - 45 mmHg   Bicarbonate 16.4 (L) 20.0 - 28.0 mmol/L   TCO2 18 (L) 22 - 32 mmol/L   O2 Saturation 59 %   Acid-base deficit 15.0 (H) 0.0 - 2.0 mmol/L   Sodium 139 135 - 145 mmol/L   Potassium 3.5 3.5 - 5.1 mmol/L   Calcium, Ion 1.09 (L) 1.15 - 1.40 mmol/L   HCT 50.0 (H) 36.0 - 46.0 %   Hemoglobin 17.0 (H) 12.0 - 15.0 g/dL   Sample type VENOUS    Comment NOTIFIED PHYSICIAN   I-Stat Lactic Acid     Status: Abnormal   Collection Time: 12/05/22  6:29 PM  Result Value Ref Range   Lactic Acid, Venous 13.7 (HH) 0.5 - 1.9 mmol/L   Comment NOTIFIED PHYSICIAN   POC CBG, ED     Status: Abnormal   Collection Time: 12/05/22  6:29 PM  Result Value Ref Range   Glucose-Capillary 243 (H) 70 - 99 mg/dL    Comment: Glucose reference range applies only to samples taken after fasting for at least 8 hours.  Lactic acid, plasma     Status: Abnormal   Collection Time: 12/05/22  7:16 PM  Result Value Ref Range   Lactic Acid, Venous >9.0 (HH) 0.5 - 1.9 mmol/L    Comment: CRITICAL RESULT CALLED TO, READ BACK BY AND VERIFIED WITH Kathleen Lime RN @2014  ON 12/05/22 BY MAB MT Performed at [facility], [address] [place], [place] [address]   I-Stat arterial blood gas, ED     Status: Abnormal   Collection Time: 12/05/22  8:08 PM  Result Value Ref Range   pH, Arterial 7.180 (LL) 7.35 - 7.45   pCO2 arterial 60.4 (H) 32 - 48 mmHg   pO2, Arterial 91 83 - 108 mmHg   Bicarbonate 22.6 20.0 - 28.0 mmol/L   TCO2 24 22 - 32 mmol/L   O2 Saturation 94 %   Acid-base deficit 7.0 (H) 0.0 - 2.0 mmol/L   Sodium 137 135 - 145 mmol/L   Potassium 3.8 3.5 - 5.1 mmol/L   Calcium, Ion 1.20 1.15 - 1.40 mmol/L   HCT 48.0 (H) 36.0 - 46.0 %   Hemoglobin 16.3 (H) 12.0 - 15.0 g/dL   Patient temperature 29.5 F    Sample type ARTERIAL    Comment NOTIFIED PHYSICIAN   Glucose,  capillary     Status: Abnormal   Collection Time: 12/05/22 10:32 PM  Result Value Ref Range   Glucose-Capillary 218 (H) 70 - 99 mg/dL    Comment: Glucose reference range applies only to samples taken after fasting for at least 8 hours.  MRSA Next Gen by PCR, Nasal     Status: None   Collection Time: 12/05/22 11:02 PM   Specimen: Nasal Mucosa; Nasal Swab  Result Value Ref Range   MRSA by PCR Next Gen NOT DETECTED NOT DETECTED    Comment: (NOTE) The GeneXpert MRSA Assay (FDA approved for NASAL specimens only), is one component of a comprehensive MRSA colonization surveillance program. It is not intended to diagnose MRSA infection nor to guide or monitor treatment for MRSA infections. Test performance is not FDA approved in patients less than 9 years old. Performed at [facility], [address] [place], [place] [address]   Rapid urine drug screen (hospital performed)     Status: None   Collection Time: 12/05/22 11:29 PM  Result Value Ref Range   Opiates NONE DETECTED NONE DETECTED  Cocaine NONE DETECTED NONE DETECTED   Benzodiazepines NONE DETECTED NONE DETECTED   Amphetamines NONE DETECTED NONE DETECTED   Tetrahydrocannabinol NONE DETECTED NONE DETECTED   Barbiturates NONE DETECTED NONE DETECTED    Comment: (NOTE) DRUG SCREEN FOR MEDICAL PURPOSES ONLY.  IF CONFIRMATION IS NEEDED FOR ANY PURPOSE, NOTIFY LAB WITHIN 5 DAYS.  LOWEST DETECTABLE LIMITS FOR URINE DRUG SCREEN Drug Class                     Cutoff (ng/mL) Amphetamine and metabolites    1000 Barbiturate and metabolites    200 Benzodiazepine                 200 Opiates and metabolites        300 Cocaine and metabolites        300 THC                            50 Performed at [facility], [address], [place], [place] [address]   I-STAT 7, (LYTES, BLD GAS, ICA, H+H)     Status: Abnormal   Collection Time: 12/06/22 12:04 AM  Result Value Ref Range   pH, Arterial 7.300 (L) 7.35 - 7.45   pCO2  arterial 40.0 32 - 48 mmHg   pO2, Arterial 195 (H) 83 - 108 mmHg   Bicarbonate 19.6 (L) 20.0 - 28.0 mmol/L   TCO2 21 (L) 22 - 32 mmol/L   O2 Saturation 100 %   Acid-base deficit 6.0 (H) 0.0 - 2.0 mmol/L   Sodium 133 (L) 135 - 145 mmol/L   Potassium 4.1 3.5 - 5.1 mmol/L   Calcium, Ion 1.14 (L) 1.15 - 1.40 mmol/L   HCT 49.0 (H) 36.0 - 46.0 %   Hemoglobin 16.7 (H) 12.0 - 15.0 g/dL   Patient temperature 60.4 C    Sample type ARTERIAL   Lactic acid, plasma     Status: Abnormal   Collection Time: 12/06/22 12:09 AM  Result Value Ref Range   Lactic Acid, Venous 2.5 (HH) 0.5 - 1.9 mmol/L    Comment: CRITICAL RESULT CALLED TO, READ BACK BY AND VERIFIED WITH Tonye Becket RN [phone] M.[facility] Performed at [facility], [address], [place], [place] [address]   Troponin I (High Sensitivity)     Status: Abnormal   Collection Time: 12/06/22 12:09 AM  Result Value Ref Range   Troponin I (High Sensitivity) 2,849 (HH) <18 ng/L    Comment: CRITICAL RESULT CALLED TO, READ BACK BY AND VERIFIED WITH Tonye Becket RN [phone] M.ALAMANO (NOTE) Elevated high sensitivity troponin I (hsTnI) values and significant  changes across serial measurements may suggest ACS but many other  chronic and acute conditions are known to elevate hsTnI results.  Refer to the "Links" section for chest pain algorithms and additional  guidance. Performed at [facility], [address] [place], [place] [address]   CBC     Status: Abnormal   Collection Time: 12/06/22 12:09 AM  Result Value Ref Range   WBC 20.5 (H) 4.0 - 10.5 K/uL   RBC 5.77 (H) 3.87 - 5.11 MIL/uL   Hemoglobin 16.1 (H) 12.0 - 15.0 g/dL   HCT 95.2 (H) 84.1 - 32.4 %   MCV 88.2 80.0 - 100.0 fL   MCH 27.9 26.0 - 34.0 pg   MCHC 31.6 30.0 - 36.0 g/dL   RDW 40.1 02.7 - 25.3 %  Platelets 352 150 - 400 K/uL   nRBC 0.0 0.0 - 0.2 %    Comment: Performed at [facility], [address], [place], [place] [address]  Basic metabolic panel      Status: Abnormal   Collection Time: 12/06/22 12:09 AM  Result Value Ref Range   Sodium 138 135 - 145 mmol/L   Potassium 4.5 3.5 - 5.1 mmol/L   Chloride 106 98 - 111 mmol/L   CO2 16 (L) 22 - 32 mmol/L   Glucose, Bld 211 (H) 70 - 99 mg/dL    Comment: Glucose reference range applies only to samples taken after fasting for at least 8 hours.   BUN 25 (H) 6 - 20 mg/dL   Creatinine, Ser 1.91 (H) 0.44 - 1.00 mg/dL   Calcium 8.9 8.9 - 47.8 mg/dL   GFR, Estimated 40 (L) >60 mL/min    Comment: (NOTE) Calculated using the CKD-EPI Creatinine Equation (2021)    Anion gap 16 (H) 5 - 15    Comment: Performed at [facility], [address], [place], [place] [address]  Phosphorus     Status: Abnormal   Collection Time: 12/06/22 12:09 AM  Result Value Ref Range   Phosphorus 6.2 (H) 2.5 - 4.6 mg/dL    Comment: Performed at [facility], [address], [place], [place] [address]  Hepatic function panel     Status: Abnormal   Collection Time: 12/06/22 12:09 AM  Result Value Ref Range   Total Protein 7.0 6.5 - 8.1 g/dL   Albumin 3.2 (L) 3.5 - 5.0 g/dL   AST 578 (H) 15 - 41 U/L   ALT 239 (H) 0 - 44 U/L   Alkaline Phosphatase 42 38 - 126 U/L   Total Bilirubin 1.2 0.3 - 1.2 mg/dL   Bilirubin, Direct 0.3 (H) 0.0 - 0.2 mg/dL   Indirect Bilirubin 0.9 0.3 - 0.9 mg/dL    Comment: Performed at [facility], [address] [place], [place] [address]  Ethanol     Status: None   Collection Time: 12/06/22 12:09 AM  Result Value Ref Range   Alcohol, Ethyl (B) <10 <10 mg/dL    Comment: (NOTE) Lowest detectable limit for serum alcohol is 10 mg/dL.  For medical purposes only. Performed at [facility], [address], [place], [place] [address]   Acetaminophen level     Status: Abnormal   Collection Time: 12/06/22 12:09 AM  Result Value Ref Range   Acetaminophen (Tylenol), Serum <10 (L) 10 - 30 ug/mL    Comment: (NOTE) Therapeutic concentrations vary significantly. A range of  10-30 ug/mL  may be an effective concentration for many patients. However, some  are best treated at concentrations outside of this range. Acetaminophen concentrations >150 ug/mL at 4 hours after ingestion  and >50 ug/mL at 12 hours after ingestion are often associated with  toxic reactions.  Performed at [facility], [address], [place], [place] [address]   Salicylate level     Status: Abnormal   Collection Time: 12/06/22 12:09 AM  Result Value Ref Range   Salicylate Lvl <7.0 (L) 7.0 - 30.0 mg/dL    Comment: Performed at [facility], [address] [place], [place] [address]  Magnesium     Status: Abnormal   Collection Time: 12/06/22 12:09 AM  Result Value Ref Range   Magnesium 2.7 (H) 1.7 - 2.4 mg/dL    Comment: Performed at  [facility], [address] [place] [place], [place] [address]  Glucose, capillary     Status: Abnormal   Collection Time: 12/06/22 12:15 AM  Result Value Ref Range   Glucose-Capillary 203 (H) 70 - 99 mg/dL    Comment: Glucose reference range applies only to samples taken after fasting for at least 8 hours.  Troponin I (High Sensitivity)     Status: Abnormal   Collection Time: 12/06/22  3:17 AM  Result Value Ref Range   Troponin I (High Sensitivity) 3,234 (HH) <18 ng/L    Comment: CRITICAL VALUE NOTED. VALUE IS CONSISTENT WITH PREVIOUSLY REPORTED/CALLED VALUE (NOTE) Elevated high sensitivity troponin I (hsTnI) values and significant  changes across serial measurements may suggest ACS but many other  chronic and acute conditions are known to elevate hsTnI results.  Refer to the "Links" section for chest pain algorithms and additional  guidance. Performed at [facility], [address], [place], [place] [address]   Triglycerides     Status: Abnormal   Collection Time: 12/06/22  3:20 AM  Result Value Ref Range   Triglycerides 208 (H) <150 mg/dL    Comment: Performed at [facility], [address], [place], [place]  [address]  Glucose, capillary     Status: Abnormal   Collection Time: 12/06/22  5:16 AM  Result Value Ref Range   Glucose-Capillary 160 (H) 70 - 99 mg/dL    Comment: Glucose reference range applies only to samples taken after fasting for at least 8 hours.  Glucose, capillary     Status: Abnormal   Collection Time: 12/06/22  8:04 AM  Result Value Ref Range   Glucose-Capillary 178 (H) 70 - 99 mg/dL    Comment: Glucose reference range applies only to samples taken after fasting for at least 8 hours.    ECG   Sinus rhythm with PVC's, NS IVCD - Personally Reviewed  Telemetry   Sinus rhythm with AIVR - Personally Reviewed  Radiology    CT Angio Chest Pulmonary Embolism (PE) W or WO Contrast  Result Date: 12/05/2022 CLINICAL DATA:  Chest pain. The patient is intubated. Pulmonary embolism suspected. EXAM: CT ANGIOGRAPHY CHEST WITH CONTRAST TECHNIQUE: Multidetector CT imaging of the chest was performed using the standard protocol during bolus administration of intravenous contrast. Multiplanar CT image reconstructions and MIPs were obtained to evaluate the vascular anatomy. RADIATION DOSE REDUCTION: This exam was performed according to the departmental dose-optimization program which includes automated exposure control, adjustment of the mA and/or kV according to patient size and/or use of iterative reconstruction technique. CONTRAST:  75mL OMNIPAQUE IOHEXOL 350 MG/ML SOLN COMPARISON:  Chest radiographs earlier today and CT 10/19/2016 FINDINGS: Cardiovascular: Suboptimal evaluation of the segmental and subsegmental pulmonary arteries due to motion. No evidence of pulmonary embolism. No pericardial effusion. Cardiomegaly. Mediastinum/Nodes: Endotracheal tube tip in the intrathoracic trachea 1.9 cm from the carina. Subdiaphragmatic enteric tube. No thoracic adenopathy. Lungs/Pleura: Bilateral posterior upper lobe atelectasis. Atelectasis in the superior segment of the lower lobes. No pleural effusion or  pneumothorax. Upper Abdomen: Cholecystectomy.  No acute abnormality. Musculoskeletal: No acute fracture. Review of the MIP images confirms the above findings. IMPRESSION: 1. No evidence of pulmonary embolism. 2. Bilateral upper lobe atelectasis. Pneumonia is considered less likely though difficult to exclude. Electronically Signed   By: Minerva Fester M.D.   On: 12/05/2022 21:39   CT Head Wo Contrast  Result Date: 12/05/2022 CLINICAL DATA:  Mental status change, unknown cause EXAM: CT HEAD WITHOUT  CONTRAST TECHNIQUE: Contiguous axial images were obtained from the base of the skull through the vertex without intravenous contrast. RADIATION DOSE REDUCTION: This exam was performed according to the departmental dose-optimization program which includes automated exposure control, adjustment of the mA and/or kV according to patient size and/or use of iterative reconstruction technique. COMPARISON:  None Available. FINDINGS: Brain: Mild streak artifact from adjacent monitoring device. Questionable area of low-density in the right frontal lobe. Gray-white differentiation, series 3, image 28. This is not confirmed on reformats. No acute intracranial hemorrhage. No hydrocephalus. No subdural or extra-axial collection. Vascular: No evidence of hyperdense vessel, although artifact limits assessment. Skull: No fracture or focal lesion.  Frontal hyperostosis. Sinuses/Orbits: No acute finding.  Orogastric tube and enteric tube. Other: None. IMPRESSION: 1. Questionable area of low-density in the right frontal lobe. Gray-white differentiation is preserved. This is not confirmed on reformats. This may be artifactual, recommend MRI for further assessment. 2. No acute intracranial hemorrhage. Electronically Signed   By: Narda Rutherford M.D.   On: 12/05/2022 21:35   DG Abd Portable 1V  Result Date: 12/05/2022 CLINICAL DATA:  Orogastric tube placement. EXAM: PORTABLE ABDOMEN - 1 VIEW COMPARISON:  None Available. FINDINGS:  Portable supine view of the abdomen focused on the left upper quadrant. Tip and side port of the enteric tube below the diaphragm in the stomach. Paucity of bowel gas in the included abdomen. IMPRESSION: Tip and side port of the enteric tube below the diaphragm in the stomach. Electronically Signed   By: Narda Rutherford M.D.   On: 12/05/2022 20:36   DG Chest Port 1 View  Result Date: 12/05/2022 CLINICAL DATA:  Chest pain.  Patient is intubated EXAM: PORTABLE CHEST 1 VIEW COMPARISON:  02/25/2022 FINDINGS: Endotracheal tube tip just below the clavicular heads, the carina is difficult to accurately delineate on the current exam. Enteric tube with tip below the diaphragm not included in the field of view. Very low lung volumes limit assessment. The heart is enlarged. Slight globular appearance of the cardiac silhouette is nonspecific due to technique and low lung volumes. No pneumothorax or large pleural effusion. Bronchovascular crowding. IMPRESSION: 1. Endotracheal tube tip just below the clavicular heads, the carina is difficult to accurately delineate on the current exam. Enteric tube with tip below the diaphragm not included in the field of view. 2. Very low lung volumes with bronchovascular crowding. Cardiomegaly. Electronically Signed   By: Narda Rutherford M.D.   On: 12/05/2022 20:35    Cardiac Studies   Repeat echo pending today  Assessment   Principal Problem:   Cardiac arrest with ventricular fibrillation (HCC)   Plan   Critically ill with long resuscitation, on pressors with improved lactate. Echo pending today. Troponin elevated, possibly suggesting NSTEMI, but history of NICM and this could certainly be due to demand ischemia. Not in a condition for LHC at this point - may consider next week depending on if she progresses neurologically. Agree with current medications, however, would add aspirin given elevated troponin - however, suspect this is NICM - no apparent coronary calcium on my  review of her CT. Holding off on heparin.  Prognosis remains extremely guarded at this point.  CRITICAL CARE TIME: I have spent a total of 35 minutes with patient reviewing hospital notes, telemetry, EKGs, labs and examining the patient as well as establishing an assessment and plan that was discussed with the patient.  > 50% of time was spent in direct patient care. The patient is critically ill with  multi-organ system failure and requires high complexity decision making for assessment and support, frequent evaluation and titration of therapies, application of advanced monitoring technologies and extensive interpretation of multiple databases.   Length of Stay:  LOS: 1 day   Chrystie Nose, MD, [facility], FACP  [place]  [facility]  Medical Director of the [facility] Lipid Disorders &  Cardiovascular Risk Reduction Clinic Diplomate of the American Board of Clinical Lipidology Attending Cardiologist  Direct Dial: [phone]  Fax: [phone]  Website:  [email or web address] 12/06/2022, 8:33 AM

## 2022-12-07 ENCOUNTER — Inpatient Hospital Stay (HOSPITAL_COMMUNITY): Payer: Medicare Other

## 2022-12-07 DIAGNOSIS — I4901 Ventricular fibrillation: Secondary | ICD-10-CM | POA: Diagnosis not present

## 2022-12-07 DIAGNOSIS — I469 Cardiac arrest, cause unspecified: Secondary | ICD-10-CM | POA: Diagnosis not present

## 2022-12-07 DIAGNOSIS — R578 Other shock: Secondary | ICD-10-CM

## 2022-12-07 LAB — BASIC METABOLIC PANEL
Anion gap: 12 (ref 5–15)
BUN: 27 mg/dL — ABNORMAL HIGH (ref 6–20)
CO2: 20 mmol/L — ABNORMAL LOW (ref 22–32)
Calcium: 8 mg/dL — ABNORMAL LOW (ref 8.9–10.3)
Chloride: 104 mmol/L (ref 98–111)
Creatinine, Ser: 1.71 mg/dL — ABNORMAL HIGH (ref 0.44–1.00)
GFR, Estimated: 35 mL/min — ABNORMAL LOW (ref >60)
Glucose, Bld: 174 mg/dL — ABNORMAL HIGH (ref 70–99)
Potassium: 2.9 mmol/L — ABNORMAL LOW (ref 3.5–5.1)
Sodium: 136 mmol/L (ref 135–145)

## 2022-12-07 LAB — GLUCOSE, CAPILLARY
Glucose-Capillary: 115 mg/dL — ABNORMAL HIGH (ref 70–99)
Glucose-Capillary: 127 mg/dL — ABNORMAL HIGH (ref 70–99)
Glucose-Capillary: 150 mg/dL — ABNORMAL HIGH (ref 70–99)
Glucose-Capillary: 161 mg/dL — ABNORMAL HIGH (ref 70–99)
Glucose-Capillary: 166 mg/dL — ABNORMAL HIGH (ref 70–99)
Glucose-Capillary: 181 mg/dL — ABNORMAL HIGH (ref 70–99)

## 2022-12-07 LAB — ECHOCARDIOGRAM COMPLETE
AR max vel: 1.53 cm2
AV Mean grad: 4 mm[Hg]
AV Peak grad: 7 mm[Hg]
Ao pk vel: 1.32 m/s
Area-P 1/2: 3.37 cm2
Calc EF: 30.6 %
Height: 64 in
S' Lateral: 3.9 cm
Single Plane A2C EF: 30.4 %
Single Plane A4C EF: 30.5 %
Weight: 4846.59 [oz_av]

## 2022-12-07 LAB — CBC
HCT: 45.1 % (ref 36.0–46.0)
Hemoglobin: 15.1 g/dL — ABNORMAL HIGH (ref 12.0–15.0)
MCH: 27.8 pg (ref 26.0–34.0)
MCHC: 33.5 g/dL (ref 30.0–36.0)
MCV: 82.9 fL (ref 80.0–100.0)
Platelets: 219 10*3/uL (ref 150–400)
RBC: 5.44 MIL/uL — ABNORMAL HIGH (ref 3.87–5.11)
RDW: 14.6 % (ref 11.5–15.5)
WBC: 19.6 10*3/uL — ABNORMAL HIGH (ref 4.0–10.5)
nRBC: 0 % (ref 0.0–0.2)

## 2022-12-07 LAB — HEMOGLOBIN A1C
Hgb A1c MFr Bld: 6.6 % — ABNORMAL HIGH (ref 4.8–5.6)
Mean Plasma Glucose: 142.72 mg/dL

## 2022-12-07 LAB — HEPATIC FUNCTION PANEL
ALT: 134 U/L — ABNORMAL HIGH (ref 0–44)
AST: 76 U/L — ABNORMAL HIGH (ref 15–41)
Albumin: 2.6 g/dL — ABNORMAL LOW (ref 3.5–5.0)
Alkaline Phosphatase: 36 U/L — ABNORMAL LOW (ref 38–126)
Bilirubin, Direct: 0.2 mg/dL (ref 0.0–0.2)
Indirect Bilirubin: 0.8 mg/dL (ref 0.3–0.9)
Total Bilirubin: 1 mg/dL (ref 0.3–1.2)
Total Protein: 6.3 g/dL — ABNORMAL LOW (ref 6.5–8.1)

## 2022-12-07 LAB — PHOSPHORUS: Phosphorus: 3.5 mg/dL (ref 2.5–4.6)

## 2022-12-07 LAB — MAGNESIUM: Magnesium: 2.1 mg/dL (ref 1.7–2.4)

## 2022-12-07 LAB — LACTIC ACID, PLASMA: Lactic Acid, Venous: 1.3 mmol/L (ref 0.5–1.9)

## 2022-12-07 MED ORDER — POTASSIUM CHLORIDE 10 MEQ/50ML IV SOLN
10.0000 meq | INTRAVENOUS | Status: AC
  Administered 2022-12-07 (×4): 10 meq via INTRAVENOUS
  Filled 2022-12-07 (×4): qty 50

## 2022-12-07 MED ORDER — OXYCODONE HCL 5 MG PO TABS
5.0000 mg | ORAL_TABLET | ORAL | Status: DC | PRN
  Administered 2022-12-07: 5 mg via ORAL
  Filled 2022-12-07: qty 1

## 2022-12-07 MED ORDER — POTASSIUM CHLORIDE 20 MEQ PO PACK
40.0000 meq | PACK | Freq: Two times a day (BID) | ORAL | Status: AC
  Administered 2022-12-07 – 2022-12-08 (×3): 40 meq via ORAL
  Filled 2022-12-07 (×3): qty 2

## 2022-12-07 MED ORDER — FENTANYL CITRATE PF 50 MCG/ML IJ SOSY
PREFILLED_SYRINGE | INTRAMUSCULAR | Status: AC
  Administered 2022-12-07: 25 ug via INTRAVENOUS
  Filled 2022-12-07: qty 1

## 2022-12-07 MED ORDER — ORAL CARE MOUTH RINSE
15.0000 mL | OROMUCOSAL | Status: DC
  Administered 2022-12-07 – 2022-12-17 (×24): 15 mL via OROMUCOSAL

## 2022-12-07 MED ORDER — POTASSIUM CHLORIDE 20 MEQ PO PACK
40.0000 meq | PACK | Freq: Once | ORAL | Status: AC
  Administered 2022-12-07: 40 meq
  Filled 2022-12-07: qty 2

## 2022-12-07 MED ORDER — FAMOTIDINE 20 MG PO TABS
20.0000 mg | ORAL_TABLET | Freq: Two times a day (BID) | ORAL | Status: DC
  Administered 2022-12-07 – 2022-12-10 (×6): 20 mg via ORAL
  Filled 2022-12-07 (×6): qty 1

## 2022-12-07 MED ORDER — FENTANYL CITRATE PF 50 MCG/ML IJ SOSY
25.0000 ug | PREFILLED_SYRINGE | INTRAMUSCULAR | Status: DC | PRN
  Administered 2022-12-07 – 2022-12-08 (×9): 25 ug via INTRAVENOUS
  Filled 2022-12-07 (×9): qty 1

## 2022-12-07 MED ORDER — PERFLUTREN LIPID MICROSPHERE
1.0000 mL | INTRAVENOUS | Status: AC | PRN
  Administered 2022-12-07: 3 mL via INTRAVENOUS

## 2022-12-07 MED ORDER — OXYCODONE HCL 5 MG PO TABS
5.0000 mg | ORAL_TABLET | ORAL | Status: DC | PRN
  Administered 2022-12-07 – 2022-12-17 (×32): 5 mg via ORAL
  Filled 2022-12-07 (×33): qty 1

## 2022-12-07 MED ORDER — ORAL CARE MOUTH RINSE: 15.0000 mL | OROMUCOSAL | Status: DC | PRN

## 2022-12-07 MED ORDER — FUROSEMIDE 10 MG/ML IJ SOLN
60.0000 mg | Freq: Once | INTRAMUSCULAR | Status: AC
  Administered 2022-12-07: 60 mg via INTRAVENOUS
  Filled 2022-12-07: qty 6

## 2022-12-07 MED ORDER — ASPIRIN 81 MG PO CHEW
81.0000 mg | CHEWABLE_TABLET | Freq: Every day | ORAL | Status: DC
  Administered 2022-12-08 – 2022-12-09 (×2): 81 mg via ORAL
  Filled 2022-12-07 (×2): qty 1

## 2022-12-07 NOTE — Procedures (Signed)
Extubation Procedure Note  Patient Details:   Name: [name] DOB: 01-28-1966 MRN: [id number]   Airway Documentation:    Vent end date: 12/07/22 Vent end time: 1508   Evaluation  O2 sats: stable throughout Complications: No apparent complications Patient did tolerate procedure well. Bilateral Breath Sounds: Clear, Diminished   Yes  Patient was extubated per order. Patient had slight cuff leak, MD was okay with extubating. Extubated to 4 LPM [place] at 95%. Had strong productive cough;some bloody sputum. Able to speak. No stridor noted. Vitals stable. RN at bedside.   Kimberlly Norgard M 12/07/2022, 3:18 PM

## 2022-12-07 NOTE — Progress Notes (Signed)
Rounding Note    Patient Name: [name] Date of Encounter: 12/07/2022  [facility] Cardiologist: [name], MD   Subjective   57 year old female with a history of morbid obesity, obstructive sleep apnea, diastolic congestive heart failure, nonischemic cardiomyopathy with an EF of 30 to 35%, ( by echo May , 2023) depression who is seen following a cardiac arrest.  She had VF arrest and had a long code eventually had ROSC. On amio Levophed  Repeat echo scheduled for today    She is waking up well Answered my questions with nodding and shaking her head  Wants the  mittens off her hands    Inpatient Medications    Scheduled Meds:  aspirin  81 mg Per Tube Daily   Chlorhexidine Gluconate Cloth  6 each Topical Daily   famotidine  20 mg Per Tube BID   heparin injection (subcutaneous)  5,000 Units Subcutaneous Q8H   insulin aspart  0-20 Units Subcutaneous Q4H   mouth rinse  15 mL Mouth Rinse Q2H   sodium chloride flush  10 mL Intravenous Q12H   Continuous Infusions:  amiodarone 30 mg/hr (12/07/22 0700)   cefTRIAXone (ROCEPHIN)  IV Stopped (12/06/22 0926)   dexmedetomidine (PRECEDEX) IV infusion 0.7 mcg/kg/hr (12/07/22 0700)   norepinephrine (LEVOPHED) Adult infusion 4 mcg/min (12/07/22 0700)   potassium chloride 10 mEq (12/07/22 0826)   vasopressin Stopped (12/06/22 1835)   PRN Meds: acetaminophen **OR** acetaminophen (TYLENOL) oral liquid 160 mg/5 mL **OR** acetaminophen, fentaNYL (SUBLIMAZE) injection, mouth rinse   Vital Signs    Vitals:   12/07/22 0630 12/07/22 0645 12/07/22 0700 12/07/22 0759  BP:    109/63  Pulse: 83 81 81 80  Resp: (!) 31 (!) 27 (!) 30 (!) 32  Temp:  (!) 101.5 F (38.6 C) (!) 101.5 F (38.6 C) (!) 101.3 F (38.5 C)  TempSrc:      SpO2: 100% 100% 100% 100%  Weight:        Intake/Output Summary (Last 24 hours) at 12/07/2022 0859 Last data filed at 12/07/2022 0700 Gross per 24 hour  Intake 2040.23 ml  Output 1125 ml   Net 915.23 ml      12/07/2022    5:00 AM 12/06/2022    5:54 AM 12/06/2022    5:47 AM  Last 3 Weights  Weight (lbs) 302 lb 14.6 oz 304 lb 7.3 oz 304 lb 3.8 oz   Weight (kg) 137.4 kg 138.1 kg 138 kg      Significant value      Telemetry     Nsr  - Personally Reviewed  ECG     - Personally Reviewed  Physical Exam   GEN: obese, middle age female,  NAD , waking up  Neck: No JVD Cardiac: RRR, no murmurs, rubs, or gallops.  Respiratory: Clear to auscultation bilaterally. GI: Soft, nontender, non-distended  MS: No edema; No deformity. Neuro:  Nonfocal  Psych: Normal affect   Labs    High Sensitivity Troponin:   Recent Labs  Lab 12/06/22 0009 12/06/22 0317 12/06/22 1044 12/06/22 1321 12/06/22 1601  TROPONINIHS 2,849* 3,234* 1,025* 809* 636*     Chemistry Recent Labs  Lab 12/05/22 1825 12/05/22 1827 12/05/22 2008 12/06/22 0004 12/06/22 0009 12/06/22 1044 12/07/22 0334  NA 142 139  140   < > 133* 138  --  136  K 3.7 3.5  3.4*   < > 4.1 4.5  --  2.9*  CL 100 104  --   --  106  --  104  CO2 15*  --   --   --  16*  --  20*  GLUCOSE 289* 285*  --   --  211*  --  174*  BUN 17 21*  --   --  25*  --  27*  CREATININE 1.34* 1.10*  --   --  1.52*  --  1.71*  CALCIUM 9.6  --   --   --  8.9  --  8.0*  MG  --   --   --   --  2.7* 2.3 2.1  PROT 6.3*  --   --   --  7.0  --  6.3*  ALBUMIN 3.1*  --   --   --  3.2*  --  2.6*  AST 182*  --   --   --  248*  --  76*  ALT 203*  --   --   --  239*  --  134*  ALKPHOS 42  --   --   --  42  --  36*  BILITOT 0.9  --   --   --  1.2  --  1.0  GFRNONAA 46*  --   --   --  40*  --  35*  ANIONGAP 27*  --   --   --  16*  --  12   < > = values in this interval not displayed.    Lipids  Recent Labs  Lab 12/06/22 0320  TRIG 208*    Hematology Recent Labs  Lab 12/05/22 1825 12/05/22 1827 12/06/22 0004 12/06/22 0009 12/07/22 0334  WBC 15.8*  --   --  20.5* 19.6*  RBC 5.55*  --   --  5.77* 5.44*  HGB 15.9*   < > 16.7* 16.1*  15.1*  HCT 51.7*   < > 49.0* 50.9* 45.1  MCV 93.2  --   --  88.2 82.9  MCH 28.6  --   --  27.9 27.8  MCHC 30.8  --   --  31.6 33.5  RDW 14.6  --   --  15.0 14.6  PLT 246  --   --  352 219   < > = values in this interval not displayed.   Thyroid No results for input(s): "TSH", "FREET4" in the last 168 hours.  BNPNo results for input(s): "BNP", "PROBNP" in the last 168 hours.  DDimer No results for input(s): "DDIMER" in the last 168 hours.   Radiology    DG CHEST PORT 1 VIEW  Result Date: 12/06/2022 CLINICAL DATA:  Central line placement EXAM: PORTABLE CHEST 1 VIEW COMPARISON:  12/05/2022 radiographs FINDINGS: The patient is rotated to the left on today's radiograph, reducing diagnostic sensitivity and specificity. Endotracheal tube tip 3.2 cm above the carina. A left central line is observed with tip projecting over the right atrium. Low lung volumes are present, causing crowding of the pulmonary vasculature. No pneumothorax observed. Suspected underlying mild enlargement of the cardiopericardial silhouette IMPRESSION: 1. Left central line tip projects over the right atrium. 2. Endotracheal tube tip 3.2 cm above the carina. 3. Low lung volumes. 4. Suspected underlying mild enlargement of the cardiopericardial silhouette. Electronically Signed   By: Gaylyn Rong M.D.   On: 12/06/2022 17:32   CT Angio Chest Pulmonary Embolism (PE) W or WO Contrast  Result Date: 12/05/2022 CLINICAL DATA:  Chest pain. The patient is intubated. Pulmonary embolism suspected. EXAM: CT ANGIOGRAPHY CHEST WITH CONTRAST TECHNIQUE: Multidetector CT  imaging of the chest was performed using the standard protocol during bolus administration of intravenous contrast. Multiplanar CT image reconstructions and MIPs were obtained to evaluate the vascular anatomy. RADIATION DOSE REDUCTION: This exam was performed according to the departmental dose-optimization program which includes automated exposure control, adjustment of  the mA and/or kV according to patient size and/or use of iterative reconstruction technique. CONTRAST:  75mL OMNIPAQUE IOHEXOL 350 MG/ML SOLN COMPARISON:  Chest radiographs earlier today and CT 10/19/2016 FINDINGS: Cardiovascular: Suboptimal evaluation of the segmental and subsegmental pulmonary arteries due to motion. No evidence of pulmonary embolism. No pericardial effusion. Cardiomegaly. Mediastinum/Nodes: Endotracheal tube tip in the intrathoracic trachea 1.9 cm from the carina. Subdiaphragmatic enteric tube. No thoracic adenopathy. Lungs/Pleura: Bilateral posterior upper lobe atelectasis. Atelectasis in the superior segment of the lower lobes. No pleural effusion or pneumothorax. Upper Abdomen: Cholecystectomy.  No acute abnormality. Musculoskeletal: No acute fracture. Review of the MIP images confirms the above findings. IMPRESSION: 1. No evidence of pulmonary embolism. 2. Bilateral upper lobe atelectasis. Pneumonia is considered less likely though difficult to exclude. Electronically Signed   By: Minerva Fester M.D.   On: 12/05/2022 21:39   CT Head Wo Contrast  Result Date: 12/05/2022 CLINICAL DATA:  Mental status change, unknown cause EXAM: CT HEAD WITHOUT CONTRAST TECHNIQUE: Contiguous axial images were obtained from the base of the skull through the vertex without intravenous contrast. RADIATION DOSE REDUCTION: This exam was performed according to the departmental dose-optimization program which includes automated exposure control, adjustment of the mA and/or kV according to patient size and/or use of iterative reconstruction technique. COMPARISON:  None Available. FINDINGS: Brain: Mild streak artifact from adjacent monitoring device. Questionable area of low-density in the right frontal lobe. Gray-white differentiation, series 3, image 28. This is not confirmed on reformats. No acute intracranial hemorrhage. No hydrocephalus. No subdural or extra-axial collection. Vascular: No evidence of  hyperdense vessel, although artifact limits assessment. Skull: No fracture or focal lesion.  Frontal hyperostosis. Sinuses/Orbits: No acute finding.  Orogastric tube and enteric tube. Other: None. IMPRESSION: 1. Questionable area of low-density in the right frontal lobe. Gray-white differentiation is preserved. This is not confirmed on reformats. This may be artifactual, recommend MRI for further assessment. 2. No acute intracranial hemorrhage. Electronically Signed   By: Narda Rutherford M.D.   On: 12/05/2022 21:35   DG Abd Portable 1V  Result Date: 12/05/2022 CLINICAL DATA:  Orogastric tube placement. EXAM: PORTABLE ABDOMEN - 1 VIEW COMPARISON:  None Available. FINDINGS: Portable supine view of the abdomen focused on the left upper quadrant. Tip and side port of the enteric tube below the diaphragm in the stomach. Paucity of bowel gas in the included abdomen. IMPRESSION: Tip and side port of the enteric tube below the diaphragm in the stomach. Electronically Signed   By: Narda Rutherford M.D.   On: 12/05/2022 20:36   DG Chest Port 1 View  Result Date: 12/05/2022 CLINICAL DATA:  Chest pain.  Patient is intubated EXAM: PORTABLE CHEST 1 VIEW COMPARISON:  02/25/2022 FINDINGS: Endotracheal tube tip just below the clavicular heads, the carina is difficult to accurately delineate on the current exam. Enteric tube with tip below the diaphragm not included in the field of view. Very low lung volumes limit assessment. The heart is enlarged. Slight globular appearance of the cardiac silhouette is nonspecific due to technique and low lung volumes. No pneumothorax or large pleural effusion. Bronchovascular crowding. IMPRESSION: 1. Endotracheal tube tip just below the clavicular heads, the carina is difficult to  accurately delineate on the current exam. Enteric tube with tip below the diaphragm not included in the field of view. 2. Very low lung volumes with bronchovascular crowding. Cardiomegaly. Electronically  Signed   By: Narda Rutherford M.D.   On: 12/05/2022 20:35    Cardiac Studies      Patient Profile     57 y.o. female admitted with VF arrest .  40 minutes of CPR with eventual ROSC Now waking up   Assessment & Plan      Cardiac arrest - continue amio for now Is on minimal support . Agree with plans for cath at some point  Echo today  Looks like there are plans to wean her from the vent today  She appears to be waking up nicely   Further management per PCCM   2.  VF:   continue amio for now .               For questions or updates, please contact [facility] Please consult [email or web address] for contact info under        Signed, Kristeen Miss, MD  12/07/2022, 8:59 AM

## 2022-12-07 NOTE — Progress Notes (Signed)
[facility] ADULT ICU REPLACEMENT PROTOCOL   The patient does apply for the [facility] Adult ICU Electrolyte Replacment Protocol based on the criteria listed below:   1.Exclusion criteria: TCTS, ECMO, Dialysis, and Myasthenia Gravis patients 2. Is GFR >/= 30 ml/min? Yes.    Patient's GFR today is 35 3. Is SCr </= 2? Yes.   Patient's SCr is 1.71 mg/dL 4. Did SCr increase >/= 0.5 in 24 hours? No. 5.Pt's weight >40kg  Yes.   6. Abnormal electrolyte(s): K+ 2.9  7. Electrolytes replaced per protocol 8.  Call MD STAT for K+ </= 2.5, Phos </= 1, or Mag </= 1 Physician:  Dr Eather Colas, Jettie Booze 12/07/2022 5:35 AM

## 2022-12-07 NOTE — Progress Notes (Signed)
Echocardiogram 2D Echocardiogram has been performed.  Psalms Olarte N Penne Rosenstock,RDCS 12/07/2022, 11:55 AM

## 2022-12-07 NOTE — Plan of Care (Signed)

## 2022-12-07 NOTE — Plan of Care (Signed)
  Problem: Education: Goal: Ability to describe self-care measures that may prevent or decrease complications (Diabetes Survival Skills Education) will improve Outcome: Progressing   Problem: Coping: Goal: Ability to adjust to condition or change in health will improve Outcome: Progressing   Problem: Fluid Volume: Goal: Ability to maintain a balanced intake and output will improve Outcome: Progressing   

## 2022-12-07 NOTE — Progress Notes (Signed)
NAME:  [name], MRN:  [id number], DOB:  June 04, 1965, LOS: 2 ADMISSION DATE:  12/05/2022, CONSULTATION DATE: 10/10 REFERRING MD: Dr. Particia Nearing, CHIEF COMPLAINT: Cardiac arrest  History of Present Illness:  57 year old female with past medical history as below, which is significant for hypertension, hyperlipidemia, CHF with EF 30 to 35% on GDMT, cardiac cath in 2018 showed no obstruction, DM, and morbid obesity who presented to [facility] emergency department on 10/10 after suffering cardiac arrest.  Patient was at home with her husband when she suffered a witnessed cardiac arrest.  She did have bystander CPR.  Upon EMS arrival she was found to be in VT/VF and ACLS was administered over over several arrest periods totaling up to 40 minutes.  Each arrest was VT/VF.  The patient received 6 defibrillations, 9 epi pushes, IV magnesium, and 2 doses of amiodarone.  Upon arrival to the emergency department she was intubated for airway protection.  She was evaluated by cardiology in the ED and PCCM was asked to evaluate for admission.  Pertinent  Medical History   Past Medical History:  Diagnosis Date   ADD (attention deficit disorder)    Anxiety    Arthritis    both knees right worse than left   Carpal tunnel syndrome of right wrist    Depression    Essential hypertension, benign    Gallstones    History of smoking 06/22/2016   Hyperlipidemia associated with type 2 diabetes mellitus (HCC), on Zocor 04/04/2011   Hypertension associated with diabetes (HCC) 06/03/2008   Migraines    Morbid obesity (HCC)    Morbid obesity with BMI of 50.0-59.9, adult (HCC) 07/12/2006   NICM (nonischemic cardiomyopathy) (HCC) 07/12/2006   01/16/18 ECHO:    - Procedure narrative: Transthoracic echocardiography. Image   quality was suboptimal. The study was technically difficult.   Intravenous contrast (Definity) was administered. - Left ventricle: The cavity size was moderately dilated. Wall   thickness was increased in a  pattern of mild LVH. Systolic   function was moderately to severely reduced. The estimated   ejection fraction w   Normal coronary arteries 04/17/2016   OSA on CPAP 10/28/2014   Spinal stenosis    back pain   Spondylosis without myelopathy or radiculopathy, lumbar region 12/04/2017   Type 2 diabetes mellitus with hyperglycemia [facility])    Past Surgical History:  Procedure Laterality Date   CHOLECYSTECTOMY     DILATATION & CURETTAGE/HYSTEROSCOPY WITH MYOSURE N/A 10/31/2017   Procedure: DILATATION & CURETTAGE/HYSTEROSCOPY WITH MYOSURE;  Surgeon: Romualdo Bolk, MD;  Location: [facility] ORS;  Service: Gynecology;  Laterality: N/A;   HYSTEROSCOPY WITH D & C N/A 05/07/2022   Procedure: DILATATION AND CURETTAGE /HYSTEROSCOPY;  Surgeon: [name], MD;  Location: [facility] OR;  Service: Gynecology;  Laterality: N/A;   RIGHT/LEFT HEART CATH AND CORONARY ANGIOGRAPHY N/A 04/11/2016   Procedure: Right/Left Heart Cath and Coronary Angiography;  Surgeon: Kathleene Hazel, MD;  Location: [facility] INVASIVE CV LAB;  Service: Cardiovascular;  Laterality: N/A;   sonogram for blood clots     no blockages   Significant Hospital Events: Including procedures, antibiotic start and stop dates in addition to other pertinent events   10/10 admitted s/p VF arrest 10/11.  Patient woke up and followed commands.  Tolerated SBT but remained vasopressor dependent.  Central line placed.  Interim History / Subjective:   With sedation interrupted, the patient is following commands.  Now on minimal vasopressor support.  Objective   Blood pressure 109/63, pulse  80, temperature 99.9 F (37.7 C), temperature source Oral, resp. rate (!) 29, weight (!) 137.4 kg, SpO2 100%. CVP:  [2 mmHg-11 mmHg] 11 mmHg  Vent Mode: PSV;CPAP FiO2 (%):  [40 %] 40 % Set Rate:  [20 bmp] 20 bmp Vt Set:  [460 mL] 460 mL PEEP:  [5 cmH20-8 cmH20] 8 cmH20 Pressure Support:  [10 cmH20] 10 cmH20 Plateau Pressure:  [25 cmH20-30 cmH20] 25 cmH20    Intake/Output Summary (Last 24 hours) at 12/07/2022 1104 Last data filed at 12/07/2022 0800 Gross per 24 hour  Intake 1575.39 ml  Output 1080 ml  Net 495.39 ml   Filed Weights   12/06/22 0547 12/06/22 0554 12/07/22 0500  Weight: (S) (!) 138 kg (!) 138.1 kg (!) 137.4 kg   Examination: General: Morbidly obese female HENT: [place]/AT, PERRL, unable to appreciate JVD Lungs: Distant, good air entry bilaterally. Tolerating SBT. Cardiovascular: RRR, no MRG Abdomen: Soft, Non-distended Extremities: No acute deformity or significant edema.  Neuro: Off sedation, the patient follows commands in all 4 extremities.  She is attempting to talk around the tube and is perseverating. GU: Foley making urine  Ancillary test personally reviewed:  Hypokalemia 2.9. Creatinine has risen further to 1.71 SCV O2 74.2% Mildly elevated transaminases-improving Elevated troponin demand related pattern.  Troponin downtrending. Leukocytosis persists at 19.6. Erythrocytosis 16.1 Negative UDS CT head negative. GI pathogen panel negative.  Blood cultures negative. Repeat echocardiogram today shows ejection fraction approximately 30%.  Overall appearance is unchanged from study from a year ago. Assessment & Plan:   VF cardiac arrest on background of HFrEF with known EF 35%. 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 Acute hypoxic respiratory failure.  Possible community-acquired pneumonia. Morbid obesity History of type 2 diabetes History of OSA on CPAP History of hypertension History of dyslipidemia.  Plan:  -Does not appear to be in shock at this time.  Recheck SCV O2.   -Elevated filling pressures/CVP.  Will diurese today. -SBT and possible extubation today. -Once extubated can resume GDMT.  Will consider heart failure  consultation. -Complete 7 days treatment for possible community-acquired pneumonia. -Acceptable glycemic control on insulin sliding scale.  Best Practice (right click and "Reselect all SmartList Selections" daily)   Diet/type: NPO DVT prophylaxis: SCD - pork allergy GI prophylaxis: H2B Lines: N/A Foley:  Yes, and it is still needed Code Status:  full code Last date of multidisciplinary goals of care discussion [ ]  Unable to contact husband via phone  CRITICAL CARE Performed by: Lynnell Catalan   Total critical care time: 35 minutes  Critical care time was exclusive of separately billable procedures and treating other patients.  Critical care was necessary to treat or prevent imminent or life-threatening deterioration.  Critical care was time spent personally by me on the following activities: development of treatment plan with patient and/or surrogate as well as nursing, discussions with consultants, evaluation of patient's response to treatment, examination of patient, obtaining history from patient or surrogate, ordering and performing treatments and interventions, ordering and review of laboratory studies, ordering and review of radiographic studies, pulse oximetry, re-evaluation of patient's condition and participation in multidisciplinary rounds.  Lynnell Catalan, MD [facility] ICU Physician [facility]  Pager: [phone] Mobile: [phone] After hours: [phone].

## 2022-12-08 DIAGNOSIS — I5020 Unspecified systolic (congestive) heart failure: Secondary | ICD-10-CM | POA: Diagnosis not present

## 2022-12-08 DIAGNOSIS — I469 Cardiac arrest, cause unspecified: Secondary | ICD-10-CM | POA: Diagnosis not present

## 2022-12-08 DIAGNOSIS — I4901 Ventricular fibrillation: Secondary | ICD-10-CM | POA: Diagnosis not present

## 2022-12-08 LAB — HEPATIC FUNCTION PANEL
ALT: 83 U/L — ABNORMAL HIGH (ref 0–44)
AST: 41 U/L (ref 15–41)
Albumin: 2.5 g/dL — ABNORMAL LOW (ref 3.5–5.0)
Alkaline Phosphatase: 32 U/L — ABNORMAL LOW (ref 38–126)
Bilirubin, Direct: <0.1 mg/dL (ref 0.0–0.2)
Total Bilirubin: 0.8 mg/dL (ref 0.3–1.2)
Total Protein: 5.7 g/dL — ABNORMAL LOW (ref 6.5–8.1)

## 2022-12-08 LAB — GLUCOSE, CAPILLARY
Glucose-Capillary: 100 mg/dL — ABNORMAL HIGH (ref 70–99)
Glucose-Capillary: 109 mg/dL — ABNORMAL HIGH (ref 70–99)
Glucose-Capillary: 109 mg/dL — ABNORMAL HIGH (ref 70–99)
Glucose-Capillary: 117 mg/dL — ABNORMAL HIGH (ref 70–99)
Glucose-Capillary: 122 mg/dL — ABNORMAL HIGH (ref 70–99)

## 2022-12-08 LAB — CBC
HCT: 40.5 % (ref 36.0–46.0)
Hemoglobin: 13.2 g/dL (ref 12.0–15.0)
MCH: 27.7 pg (ref 26.0–34.0)
MCHC: 32.6 g/dL (ref 30.0–36.0)
MCV: 85.1 fL (ref 80.0–100.0)
Platelets: 154 10*3/uL (ref 150–400)
RBC: 4.76 MIL/uL (ref 3.87–5.11)
RDW: 14.9 % (ref 11.5–15.5)
WBC: 13.2 10*3/uL — ABNORMAL HIGH (ref 4.0–10.5)
nRBC: 0 % (ref 0.0–0.2)

## 2022-12-08 LAB — BASIC METABOLIC PANEL
Anion gap: 9 (ref 5–15)
BUN: 29 mg/dL — ABNORMAL HIGH (ref 6–20)
CO2: 21 mmol/L — ABNORMAL LOW (ref 22–32)
Calcium: 7.5 mg/dL — ABNORMAL LOW (ref 8.9–10.3)
Chloride: 102 mmol/L (ref 98–111)
Creatinine, Ser: 1.73 mg/dL — ABNORMAL HIGH (ref 0.44–1.00)
GFR, Estimated: 34 mL/min — ABNORMAL LOW (ref >60)
Glucose, Bld: 121 mg/dL — ABNORMAL HIGH (ref 70–99)
Potassium: 3.6 mmol/L (ref 3.5–5.1)
Sodium: 132 mmol/L — ABNORMAL LOW (ref 135–145)

## 2022-12-08 LAB — MAGNESIUM: Magnesium: 1.8 mg/dL (ref 1.7–2.4)

## 2022-12-08 LAB — PHOSPHORUS: Phosphorus: 3.6 mg/dL (ref 2.5–4.6)

## 2022-12-08 MED ORDER — ASPIRIN 81 MG PO CHEW
81.0000 mg | CHEWABLE_TABLET | ORAL | Status: AC
  Administered 2022-12-09: 81 mg via ORAL
  Filled 2022-12-08: qty 1

## 2022-12-08 MED ORDER — HYDROXYZINE HCL 10 MG PO TABS
10.0000 mg | ORAL_TABLET | Freq: Four times a day (QID) | ORAL | Status: DC | PRN
  Administered 2022-12-08 – 2022-12-17 (×8): 10 mg via ORAL
  Filled 2022-12-08 (×9): qty 1

## 2022-12-08 MED ORDER — FENTANYL CITRATE PF 50 MCG/ML IJ SOSY
50.0000 ug | PREFILLED_SYRINGE | INTRAMUSCULAR | Status: DC | PRN
  Administered 2022-12-08 – 2022-12-16 (×23): 50 ug via INTRAVENOUS
  Filled 2022-12-08 (×25): qty 1

## 2022-12-08 MED ORDER — SODIUM CHLORIDE 0.9 % IV SOLN: INTRAVENOUS | Status: DC

## 2022-12-08 MED ORDER — ATORVASTATIN CALCIUM 80 MG PO TABS
80.0000 mg | ORAL_TABLET | Freq: Every day | ORAL | Status: DC
  Administered 2022-12-08 – 2022-12-17 (×10): 80 mg via ORAL
  Filled 2022-12-08 (×10): qty 1

## 2022-12-08 NOTE — Progress Notes (Signed)
1145 Dr. Denese Killings called to bedside due to change in area around previous left shin intraosseous site.  Site is now dusky and has multiple blisters surrounding the access site.  There is increased tightness proximal to the site in the groin.  Left dorsalis pedis pulse +2 and is unchanged from previous morning assessment.  Patient does not complain of paraesthesia, pain in minimal and unchanged. Skin color is the same as the right leg. Skin temperature is the same as the right leg.  Patient has voluntary movement of the entire left lower extremity. Dr. Denese Killings consulting specialist to see if intervention is needed.   1200 Ortho paged by Dr. Denese Killings.  Dr. Denese Killings spoke to vascular.  Vascular advised they did not think this was third spacing.  Advised to place petroleum dressing covered with guaze and kurlex.   1330 Ortho at bedside to see patient.  Also agrees patient is not third spacing.  In agreement with petroleum dressing. Will follow patient.

## 2022-12-08 NOTE — Consult Note (Signed)
Reason for Consult: Rule out left leg compartment syndrome Referring Physician: Denese Killings, MD (ICU)  [name] is an 57 y.o. female.  HPI: 57 year old female with significant cardiac related comorbidities.  She was admitted to the hospital on December 05, 2022 after cardiac arrest.  She has been in the hospital since this time in the ICU.  She has initially intubated but has subsequently been extubated.  She received intraosseous fluid management and resuscitative efforts. Since the time of her resuscitative efforts she has had skin changes involving her left leg.  There was some swelling involving her left leg and there was concerns about possible compartment syndrome given the fact that this was a known risk of intraosseous IV fluid administration.  Upon my discussion with her today she does not complain of significant lower leg pain.  Past Medical History:  Diagnosis Date   ADD (attention deficit disorder)    Anxiety    Arthritis    both knees right worse than left   Carpal tunnel syndrome of right wrist    Depression    Essential hypertension, benign    Gallstones    History of smoking 06/22/2016   Hyperlipidemia associated with type 2 diabetes mellitus (HCC), on Zocor 04/04/2011   Hypertension associated with diabetes (HCC) 06/03/2008   Migraines    Morbid obesity (HCC)    Morbid obesity with BMI of 50.0-59.9, adult (HCC) 07/12/2006   NICM (nonischemic cardiomyopathy) (HCC) 07/12/2006   01/16/18 ECHO:    - Procedure narrative: Transthoracic echocardiography. Image   quality was suboptimal. The study was technically difficult.   Intravenous contrast (Definity) was administered. - Left ventricle: The cavity size was moderately dilated. Wall   thickness was increased in a pattern of mild LVH. Systolic   function was moderately to severely reduced. The estimated   ejection fraction w   Normal coronary arteries 04/17/2016   OSA on CPAP 10/28/2014   Spinal stenosis    back pain   Spondylosis  without myelopathy or radiculopathy, lumbar region 12/04/2017   Type 2 diabetes mellitus with hyperglycemia [facility])     Past Surgical History:  Procedure Laterality Date   CHOLECYSTECTOMY     DILATATION & CURETTAGE/HYSTEROSCOPY WITH MYOSURE N/A 10/31/2017   Procedure: DILATATION & CURETTAGE/HYSTEROSCOPY WITH MYOSURE;  Surgeon: Romualdo Bolk, MD;  Location: [facility] ORS;  Service: Gynecology;  Laterality: N/A;   HYSTEROSCOPY WITH D & C N/A 05/07/2022   Procedure: DILATATION AND CURETTAGE /HYSTEROSCOPY;  Surgeon: [name], MD;  Location: [facility] OR;  Service: Gynecology;  Laterality: N/A;   RIGHT/LEFT HEART CATH AND CORONARY ANGIOGRAPHY N/A 04/11/2016   Procedure: Right/Left Heart Cath and Coronary Angiography;  Surgeon: Kathleene Hazel, MD;  Location: [facility] INVASIVE CV LAB;  Service: Cardiovascular;  Laterality: N/A;   sonogram for blood clots     no blockages    Family History  Problem Relation Age of Onset   Depression Mother    Anxiety disorder Mother    Diabetes Mother    Hypertension Mother    ADD / ADHD Father    Diabetes Father    Hypertension Father    Colitis Father    Hypertension Other    Diabetes Other    Colitis Other    Alcohol abuse Other     Social History:  reports that she quit smoking about 25 years ago. Her smoking use included cigarettes. She has never used smokeless tobacco. She reports that she does not drink alcohol and does not use drugs.  Allergies:  Allergies  Allergen Reactions   Fluoxetine Other (See Comments)    More depressed   Cymbalta [Duloxetine Hcl] Other (See Comments)    depressed   Pork-Derived Products    Penicillins Rash    Has patient had a PCN reaction causing immediate rash, facial/tongue/throat swelling, SOB or lightheadedness with hypotension: YES Has patient had a PCN reaction causing severe rash involving mucus membranes or skin necrosis: NO Has patient had a PCN reaction that required hospitalization: YES Has patient  had a PCN reaction occurring within the last 10 years: NO If all of the above answers are "NO", then may proceed with Cephalosporin use.     Medications: I have reviewed the patient's current medications. Scheduled:  aspirin  81 mg Oral Daily   [START ON 12/09/2022] aspirin  81 mg Oral Pre-Cath   atorvastatin  80 mg Oral Daily   Chlorhexidine Gluconate Cloth  6 each Topical Daily   famotidine  20 mg Oral BID   heparin injection (subcutaneous)  5,000 Units Subcutaneous Q8H   insulin aspart  0-20 Units Subcutaneous Q4H   mouth rinse  15 mL Mouth Rinse 4 times per day   sodium chloride flush  10 mL Intravenous Q12H    Results for orders placed or performed during the hospital encounter of 12/05/22 (from the past 24 hour(s))  Lactic acid, plasma     Status: None   Collection Time: 12/07/22  2:17 PM  Result Value Ref Range   Lactic Acid, Venous 1.3 0.5 - 1.9 mmol/L  Glucose, capillary     Status: Abnormal   Collection Time: 12/07/22  4:26 PM  Result Value Ref Range   Glucose-Capillary 150 (H) 70 - 99 mg/dL  Glucose, capillary     Status: Abnormal   Collection Time: 12/07/22  7:35 PM  Result Value Ref Range   Glucose-Capillary 115 (H) 70 - 99 mg/dL  Glucose, capillary     Status: Abnormal   Collection Time: 12/07/22 11:42 PM  Result Value Ref Range   Glucose-Capillary 127 (H) 70 - 99 mg/dL  Glucose, capillary     Status: Abnormal   Collection Time: 12/08/22  4:00 AM  Result Value Ref Range   Glucose-Capillary 117 (H) 70 - 99 mg/dL  CBC     Status: Abnormal   Collection Time: 12/08/22  5:09 AM  Result Value Ref Range   WBC 13.2 (H) 4.0 - 10.5 K/uL   RBC 4.76 3.87 - 5.11 MIL/uL   Hemoglobin 13.2 12.0 - 15.0 g/dL   HCT 02.7 25.3 - 66.4 %   MCV 85.1 80.0 - 100.0 fL   MCH 27.7 26.0 - 34.0 pg   MCHC 32.6 30.0 - 36.0 g/dL   RDW 40.3 47.4 - 25.9 %   Platelets 154 150 - 400 K/uL   nRBC 0.0 0.0 - 0.2 %  Basic metabolic panel     Status: Abnormal   Collection Time: 12/08/22  5:09  AM  Result Value Ref Range   Sodium 132 (L) 135 - 145 mmol/L   Potassium 3.6 3.5 - 5.1 mmol/L   Chloride 102 98 - 111 mmol/L   CO2 21 (L) 22 - 32 mmol/L   Glucose, Bld 121 (H) 70 - 99 mg/dL   BUN 29 (H) 6 - 20 mg/dL   Creatinine, Ser 5.63 (H) 0.44 - 1.00 mg/dL   Calcium 7.5 (L) 8.9 - 10.3 mg/dL   GFR, Estimated 34 (L) >60 mL/min   Anion gap 9 5 -  15  Magnesium     Status: None   Collection Time: 12/08/22  5:09 AM  Result Value Ref Range   Magnesium 1.8 1.7 - 2.4 mg/dL  Phosphorus     Status: None   Collection Time: 12/08/22  5:09 AM  Result Value Ref Range   Phosphorus 3.6 2.5 - 4.6 mg/dL  Hepatic function panel     Status: Abnormal   Collection Time: 12/08/22  5:09 AM  Result Value Ref Range   Total Protein 5.7 (L) 6.5 - 8.1 g/dL   Albumin 2.5 (L) 3.5 - 5.0 g/dL   AST 41 15 - 41 U/L   ALT 83 (H) 0 - 44 U/L   Alkaline Phosphatase 32 (L) 38 - 126 U/L   Total Bilirubin 0.8 0.3 - 1.2 mg/dL   Bilirubin, Direct <1.6 0.0 - 0.2 mg/dL   Indirect Bilirubin NOT CALCULATED 0.3 - 0.9 mg/dL  Glucose, capillary     Status: Abnormal   Collection Time: 12/08/22  7:37 AM  Result Value Ref Range   Glucose-Capillary 100 (H) 70 - 99 mg/dL  Glucose, capillary     Status: Abnormal   Collection Time: 12/08/22 11:13 AM  Result Value Ref Range   Glucose-Capillary 109 (H) 70 - 99 mg/dL     X-ray: Imaging of her left leg not required  ROS: As per HPI  Blood pressure 118/71, pulse 87, temperature 98.4 F (36.9 C), resp. rate 16, weight (!) 142.1 kg, SpO2 95%.  Physical Exam: General: Morbidly obese female, awake alert and oriented.  Her husband is in her room with her today. HENT: [place]/AT, PERRL, unable to appreciate JVD Lungs: Distant, good air entry bilaterally. Chest tender to palpation.  Cardiovascular: RRR, no MRG Abdomen: Soft, Non-distended Extremities: Left leg area of blanching with duskiness.  The area of skin change is marked on her skin and is noted to be fairly expansive with  dimensions of roughly 10 to 12 inches proximal distal and 4 5 inches wide.  Noted eschar on the proximal leg.  Some tenderness. Perhaps mild increase in size in his left lower extremity as compared to the right No pain with passive dorsiflexion or plantarflexion in the left leg other than around the skin Her calf compartments are soft to palpation without reproducible pain No obvious concerns for infection currently. Neuro: Extubated and communicating normally. GU: Foley making urine  Assessment/Plan: 1.  Rule out compartment syndrome left leg 2.  Concerns for skin necrosis involving a large area of the anterior aspect of her lower leg and slightly proximal to the knee  Plan: Upon examination there was no clinical concerns for compartment syndrome.  She did not have pain to palpation of her compartments, more importantly she did not have pain with passive dorsiflexion or plantarflexion of her ankle.  I think the largest concern involving her lower leg at this point is her skin.  If she has ongoing demarcation and necrosis of her skin the primary team can either consult plastic surgery or Dr. Aldean Baker for further evaluation and management. Otherwise she will continue to receive important cardiac care while in the hospital.  Shelda Pal 12/08/2022, 2:04 PM

## 2022-12-08 NOTE — Evaluation (Signed)
Physical Therapy Evaluation Patient Details Name: [name] MRN: [id number] DOB: 04-09-1965 Today's Date: 12/08/2022  History of Present Illness  57 year old female admitted 10/10 after suffering cardiac arrest + bystander CPR and eventual ROSC after 40 minutes.  Past medical history is significant for hypertension, hyperlipidemia, CHF with EF 30 to 35% on GDMT, DM, and morbid obesity, arthritis, ADD, OSA on CPAP, SS/back pain, DM2.  Clinical Impression  Patient presents with decreased mobility due to pain, limited activity tolerance and generalized weakness.  Limited to bed level evaluation today due to pain and no +2 available.  She was mobilizing in her home on her own though staying on main level sleeping on couch not negotiating stairs to bathroom or bedroom.  She reports attending therapy at [facility] water and land based and working to increase her mobility.  Feel she will benefit from skilled PT in the acute setting and may need intensive inpatient rehab prior to d/c home depending on progress.          If plan is discharge home, recommend the following: Two people to help with walking and/or transfers;Assist for transportation;Two people to help with bathing/dressing/bathroom;Help with stairs or ramp for entrance   Can travel by private vehicle        Equipment Recommendations Other (comment) (TBA)  Recommendations for Other Services       Functional Status Assessment Patient has had a recent decline in their functional status and demonstrates the ability to make significant improvements in function in a reasonable and predictable amount of time.     Precautions / Restrictions Precautions Precautions: Fall      Mobility  Bed Mobility               General bed mobility comments: bed level today per pt request and no +2 available    Transfers                        Ambulation/Gait                  Stairs            Wheelchair  Mobility     Tilt Bed    Modified Rankin (Stroke Patients Only)       Balance                                             Pertinent Vitals/Pain Pain Assessment Pain Assessment: Faces Faces Pain Scale: Hurts whole lot Pain Location: chest with movement of LE's Pain Descriptors / Indicators: Guarding, Grimacing, Moaning Pain Intervention(s): Monitored during session, Patient requesting pain meds-RN notified, Limited activity within patient's tolerance    Home Living Family/patient expects to be discharged to:: Private residence Living Arrangements: Spouse/significant other Available Help at Discharge: Family Type of Home:  (condo) Home Access: Stairs to enter   Secretary/administrator of Steps: 1 Alternate Level Stairs-Number of Steps: flight Home Layout: Two level Home Equipment: Rollator (4 wheels);Electric scooter Additional Comments: uses scooter if going to a large store    Prior Function Prior Level of Function : Needs assist             Mobility Comments: does pool and land therapy at [facility] ADLs Comments: spouse does all IADL's     Extremity/Trunk Assessment   Upper Extremity Assessment Upper Extremity Assessment: RUE deficits/detail;LUE deficits/detail  RUE Deficits / Details: AAROM with limited shoulder elevation with chest pain about 110 degrees, elbow/wrist WFL, strength at lest 3/5 not formally tested due to pain LUE Deficits / Details: AAROM with limited shoulder elevation with chest pain about 110 degrees, elbow/wrist WFL, strength at lest 3/5 not formally tested due to pain    Lower Extremity Assessment Lower Extremity Assessment: [name] deficits/detail;RLE deficits/detail RLE Deficits / Details: AAROM WFL, strength hip flexion 2/5, knee extension 3+/5, ankle DF 4/5 RLE Sensation: WNL RLE Coordination: decreased gross motor [name] Deficits / Details: knee and lower leg wrapped with Kerlix and drainage noted at knee; AAROM limited  by pain with knee flexion about 40 degrees in supine; performed quad sets and assisted heel slides [name] Sensation: WNL [name] Coordination: decreased gross motor    Cervical / Trunk Assessment Cervical / Trunk Assessment: Other exceptions Cervical / Trunk Exceptions: pain in chest with movement of arms/legs/neck  Communication   Communication Communication: No apparent difficulties  Cognition Arousal: Alert Behavior During Therapy: WFL for tasks assessed/performed Overall Cognitive Status: Impaired/Different from baseline Area of Impairment: Attention, Memory, Following commands                   Current Attention Level: Focused, Sustained Memory: Decreased recall of precautions, Decreased short-term memory Following Commands: Follows one step commands consistently, Follows one step commands with increased time                General Comments General comments (skin integrity, edema, etc.): spouse in the room and discussed possible need for rehab; VSS on 4L O2    Exercises General Exercises - Upper Extremity Shoulder Flexion: AAROM, 5 reps, Both, Supine Elbow Extension: AROM, Both, 5 reps, Supine General Exercises - Lower Extremity Ankle Circles/Pumps: AROM, 10 reps, Both, Supine Quad Sets: AROM, 5 reps, Left, Supine Heel Slides: AAROM, Both, Right, Left, 5 reps, Other (comment) (3 reps on L due to pain)   Assessment/Plan    PT Assessment Patient needs continued PT services  PT Problem List Decreased strength;Decreased knowledge of precautions;Decreased activity tolerance;Pain;Cardiopulmonary status limiting activity;Decreased knowledge of use of DME;Decreased mobility       PT Treatment Interventions DME instruction;Functional mobility training;Balance training;Patient/family education;Therapeutic activities;Gait training;Stair training;Therapeutic exercise    PT Goals (Current goals can be found in the Care Plan section)  Acute Rehab PT Goals Patient Stated Goal:  to get stronger, go home PT Goal Formulation: With patient/family Time For Goal Achievement: 12/22/22 Potential to Achieve Goals: Good    Frequency Min 1X/week     Co-evaluation               AM-PAC PT "6 Clicks" Mobility  Outcome Measure Help needed turning from your back to your side while in a flat bed without using bedrails?: Total Help needed moving from lying on your back to sitting on the side of a flat bed without using bedrails?: Total Help needed moving to and from a bed to a chair (including a wheelchair)?: Total Help needed standing up from a chair using your arms (e.g., wheelchair or bedside chair)?: Total Help needed to walk in hospital room?: Total Help needed climbing 3-5 steps with a railing? : Total 6 Click Score: 6    End of Session Equipment Utilized During Treatment: Oxygen Activity Tolerance: Patient limited by pain Patient left: in bed;with call bell/phone within reach   PT Visit Diagnosis: Muscle weakness (generalized) (M62.81);Other abnormalities of gait and mobility (R26.89);Pain Pain - Right/Left: Left Pain - part  of body: Knee (& chest)    Time: 4401-0272 PT Time Calculation (min) (ACUTE ONLY): 16 min   Charges:   PT Evaluation $PT Eval Moderate Complexity: 1 Mod   PT General Charges $$ ACUTE PT VISIT: 1 Visit         Sheran Lawless, PT Acute Rehabilitation Services Office:[phone] 12/08/2022   Elray Mcgregor 12/08/2022, 5:42 PM

## 2022-12-08 NOTE — Progress Notes (Addendum)
NAME:  [name], MRN:  [id number], DOB:  08/16/1965, LOS: 3 ADMISSION DATE:  12/05/2022, CONSULTATION DATE: 10/10 REFERRING MD: Dr. Particia Nearing, CHIEF COMPLAINT: Cardiac arrest  History of Present Illness:  57 year old female with past medical history as below, which is significant for hypertension, hyperlipidemia, CHF with EF 30 to 35% on GDMT, cardiac cath in 2018 showed no obstruction, DM, and morbid obesity who presented to [facility] emergency department on 10/10 after suffering cardiac arrest.  Patient was at home with her husband when she suffered a witnessed cardiac arrest.  She did have bystander CPR.  Upon EMS arrival she was found to be in VT/VF and ACLS was administered over over several arrest periods totaling up to 40 minutes.  Each arrest was VT/VF.  The patient received 6 defibrillations, 9 epi pushes, IV magnesium, and 2 doses of amiodarone.  Upon arrival to the emergency department she was intubated for airway protection.  She was evaluated by cardiology in the ED and PCCM was asked to evaluate for admission.  Pertinent  Medical History   Past Medical History:  Diagnosis Date   ADD (attention deficit disorder)    Anxiety    Arthritis    both knees right worse than left   Carpal tunnel syndrome of right wrist    Depression    Essential hypertension, benign    Gallstones    History of smoking 06/22/2016   Hyperlipidemia associated with type 2 diabetes mellitus (HCC), on Zocor 04/04/2011   Hypertension associated with diabetes (HCC) 06/03/2008   Migraines    Morbid obesity (HCC)    Morbid obesity with BMI of 50.0-59.9, adult (HCC) 07/12/2006   NICM (nonischemic cardiomyopathy) (HCC) 07/12/2006   01/16/18 ECHO:    - Procedure narrative: Transthoracic echocardiography. Image   quality was suboptimal. The study was technically difficult.   Intravenous contrast (Definity) was administered. - Left ventricle: The cavity size was moderately dilated. Wall   thickness was increased in a  pattern of mild LVH. Systolic   function was moderately to severely reduced. The estimated   ejection fraction w   Normal coronary arteries 04/17/2016   OSA on CPAP 10/28/2014   Spinal stenosis    back pain   Spondylosis without myelopathy or radiculopathy, lumbar region 12/04/2017   Type 2 diabetes mellitus with hyperglycemia [facility])    Past Surgical History:  Procedure Laterality Date   CHOLECYSTECTOMY     DILATATION & CURETTAGE/HYSTEROSCOPY WITH MYOSURE N/A 10/31/2017   Procedure: DILATATION & CURETTAGE/HYSTEROSCOPY WITH MYOSURE;  Surgeon: Romualdo Bolk, MD;  Location: [facility] ORS;  Service: Gynecology;  Laterality: N/A;   HYSTEROSCOPY WITH D & C N/A 05/07/2022   Procedure: DILATATION AND CURETTAGE /HYSTEROSCOPY;  Surgeon: [name], MD;  Location: [facility] OR;  Service: Gynecology;  Laterality: N/A;   RIGHT/LEFT HEART CATH AND CORONARY ANGIOGRAPHY N/A 04/11/2016   Procedure: Right/Left Heart Cath and Coronary Angiography;  Surgeon: Kathleene Hazel, MD;  Location: [facility] INVASIVE CV LAB;  Service: Cardiovascular;  Laterality: N/A;   sonogram for blood clots     no blockages   Significant Hospital Events: Including procedures, antibiotic start and stop dates in addition to other pertinent events   10/10 admitted s/p VF arrest 10/11.  Patient woke up and followed commands.  Tolerated SBT but remained vasopressor dependent.  Central line placed.  Interim History / Subjective:   Extubated yesterday. Complains of chest pain on palpation from CPR. Left leg has become discolored at site of I/O.  Objective  Blood pressure (!) 122/93, pulse 90, temperature 98.4 F (36.9 C), resp. rate 13, weight (!) 142.1 kg, SpO2 97%. CVP:  [6 mmHg-69 mmHg] 11 mmHg      Intake/Output Summary (Last 24 hours) at 12/08/2022 1210 Last data filed at 12/08/2022 0800 Gross per 24 hour  Intake 771.81 ml  Output 1200 ml  Net -428.19 ml   Filed Weights   12/06/22 0554 12/07/22 0500 12/08/22 0706  Weight:  (!) 138.1 kg (!) 137.4 kg (!) 142.1 kg   Examination: General: Morbidly obese female HENT: [place]/AT, PERRL, unable to appreciate JVD Lungs: Distant, good air entry bilaterally. Chest tender to palpation.  Cardiovascular: RRR, no MRG Abdomen: Soft, Non-distended Extremities: Left leg area of blanching with duskiness. Some tenderness.  Neuro: Extubated and communicating normally. GU: Foley making urine  Ancillary test personally reviewed:  Na 132 Creatinine has leveled at  further to 1.73 SCV O2 74.2% Mildly elevated transaminases-improving Elevated troponin demand related pattern.  Troponin downtrending. Leukocytosis persists at 19.6. Erythrocytosis 16.1 Negative UDS CT head negative. GI pathogen panel negative.  Blood cultures negative. Repeat echocardiogram today shows ejection fraction approximately 30%.  Overall appearance is unchanged from study from a year ago. Assessment & Plan:   Skin necrosis from possible prior extravasation of pressors from I/O removed 2 days ago. Blanching was not apparent then nor did appear to progress until now.  VF cardiac arrest on background of HFrEF with known EF 35%. Type II myocardial injury pattern secondary to cardiac arrest Distributive postcardiac arrest shock - now resolved.  Acute hypoxic respiratory failure.  Possible community-acquired pneumonia. Morbid obesity History of type 2 diabetes History of OSA on CPAP History of hypertension History of dyslipidemia.  Plan:  - Urgent orthopedics consultation. No plastics on-call.  - Spoke with VVS who don't feel compartment syndrome is likely given lack of calf pain and presence of pulses. They recommend wound care, allow injury to demarcate itself and eventual debridement and grafting if necessary.  - No role for phentolamine at this point. Extravasation had likely occurred soon after insertion and deep tissue damage occurred by the time central line was placed and I/O removed.  -Hold on  restarting GDMT until we have a potential operative plan. -Complete 7 days treatment for possible community-acquired pneumonia. -Acceptable glycemic control on insulin sliding scale.  Best Practice (right click and "Reselect all SmartList Selections" daily)   Diet/type: NPO DVT prophylaxis: SCD - pork allergy GI prophylaxis: H2B Lines: N/A Foley:  Yes, and it is still needed Code Status:  full code Last date of multidisciplinary goals of care discussion [ ]  Unable to contact husband via phone  CRITICAL CARE Performed by: Lynnell Catalan   Total critical care time: 35 minutes  Critical care time was exclusive of separately billable procedures and treating other patients.  Critical care was necessary to treat or prevent imminent or life-threatening deterioration.  Critical care was time spent personally by me on the following activities: development of treatment plan with patient and/or surrogate as well as nursing, discussions with consultants, evaluation of patient's response to treatment, examination of patient, obtaining history from patient or surrogate, ordering and performing treatments and interventions, ordering and review of laboratory studies, ordering and review of radiographic studies, pulse oximetry, re-evaluation of patient's condition and participation in multidisciplinary rounds.  Lynnell Catalan, MD [facility] ICU Physician [facility] [place] [facility]  Pager: [phone] Mobile: [phone] After hours: [phone].

## 2022-12-08 NOTE — Plan of Care (Signed)
  Problem: Education: Goal: Ability to describe self-care measures that may prevent or decrease complications (Diabetes Survival Skills Education) will improve Outcome: Progressing   Problem: Clinical Measurements: Goal: Cardiovascular complication will be avoided Outcome: Progressing   Problem: Activity: Goal: Risk for activity intolerance will decrease Outcome: Progressing   Problem: Nutrition: Goal: Adequate nutrition will be maintained Outcome: Progressing

## 2022-12-08 NOTE — Progress Notes (Signed)
Rounding Note    Patient Name: [name] Date of Encounter: 12/08/2022  [facility] Cardiologist: [name], MD   Subjective   57 year old female with a history of morbid obesity, obstructive sleep apnea, diastolic congestive heart failure, nonischemic cardiomyopathy with an EF of 30 to 35%, ( by echo May , 2023) depression who is seen following a cardiac arrest.  She had VF arrest and had a long code eventually had ROSC. On amio Levophed  Repeat echo scheduled for today   Echo from yesterday : LVEF is 30-35% Trivial MR   She was extubated yesterday.  She is awake and alert and is feeling well.  We briefly discussed heart catheterization but she wanted to discuss it further with her husband.  Are I reminded her that she had a heart catheterization approximately 7 years ago.   Inpatient Medications    Scheduled Meds:  aspirin  81 mg Oral Daily   Chlorhexidine Gluconate Cloth  6 each Topical Daily   famotidine  20 mg Oral BID   heparin injection (subcutaneous)  5,000 Units Subcutaneous Q8H   insulin aspart  0-20 Units Subcutaneous Q4H   mouth rinse  15 mL Mouth Rinse 4 times per day   potassium chloride  40 mEq Oral BID   sodium chloride flush  10 mL Intravenous Q12H   Continuous Infusions:  amiodarone 30 mg/hr (12/08/22 0600)   cefTRIAXone (ROCEPHIN)  IV Stopped (12/07/22 1152)   norepinephrine (LEVOPHED) Adult infusion Stopped (12/07/22 1747)   PRN Meds: acetaminophen **OR** acetaminophen (TYLENOL) oral liquid 160 mg/5 mL **OR** acetaminophen, fentaNYL (SUBLIMAZE) injection, mouth rinse, oxyCODONE   Vital Signs    Vitals:   12/08/22 0700 12/08/22 0715 12/08/22 0730 12/08/22 0745  BP:    135/86  Pulse: 90 88 88 93  Resp: 14 15 13 20   Temp:      TempSrc:      SpO2: 97% 96% 97% 96%  Weight:        Intake/Output Summary (Last 24 hours) at 12/08/2022 0845 Last data filed at 12/08/2022 0800 Gross per 24 hour  Intake 771.81 ml  Output 1425  ml  Net -653.19 ml      12/07/2022    5:00 AM 12/06/2022    5:54 AM 12/06/2022    5:47 AM  Last 3 Weights  Weight (lbs) 302 lb 14.6 oz 304 lb 7.3 oz 304 lb 3.8 oz   Weight (kg) 137.4 kg 138.1 kg 138 kg      Significant value      Telemetry    NSR - Personally Reviewed  ECG     - Personally Reviewed  Physical Exam    Physical Exam: Blood pressure 135/86, pulse 93, temperature 98.4 F (36.9 C), resp. rate 20, weight (!) 137.4 kg, SpO2 96%.       GEN: middle age female,  moderately obese  HEENT: Normal NECK: No JVD; No carotid bruits LYMPHATICS: No lymphadenopathy CARDIAC: RRR , no murmurs, rubs, gallops RESPIRATORY:  Clear to auscultation without rales, wheezing or rhonchi  ABDOMEN: Soft, non-tender, non-distended MUSCULOSKELETAL:  No edema; No deformity  SKIN: Warm and dry NEUROLOGIC:  Alert and oriented x 3   Labs    High Sensitivity Troponin:   Recent Labs  Lab 12/06/22 0009 12/06/22 0317 12/06/22 1044 12/06/22 1321 12/06/22 1601  TROPONINIHS 2,849* 3,234* 1,025* 809* 636*     Chemistry Recent Labs  Lab 12/06/22 0009 12/06/22 1044 12/07/22 0334 12/08/22 0509  NA 138  --  136 132*  K 4.5  --  2.9* 3.6  CL 106  --  104 102  CO2 16*  --  20* 21*  GLUCOSE 211*  --  174* 121*  BUN 25*  --  27* 29*  CREATININE 1.52*  --  1.71* 1.73*  CALCIUM 8.9  --  8.0* 7.5*  MG 2.7* 2.3 2.1 1.8  PROT 7.0  --  6.3* 5.7*  ALBUMIN 3.2*  --  2.6* 2.5*  AST 248*  --  76* 41  ALT 239*  --  134* 83*  ALKPHOS 42  --  36* 32*  BILITOT 1.2  --  1.0 0.8  GFRNONAA 40*  --  35* 34*  ANIONGAP 16*  --  12 9    Lipids  Recent Labs  Lab 12/06/22 0320  TRIG 208*    Hematology Recent Labs  Lab 12/06/22 0009 12/07/22 0334 12/08/22 0509  WBC 20.5* 19.6* 13.2*  RBC 5.77* 5.44* 4.76  HGB 16.1* 15.1* 13.2  HCT 50.9* 45.1 40.5  MCV 88.2 82.9 85.1  MCH 27.9 27.8 27.7  MCHC 31.6 33.5 32.6  RDW 15.0 14.6 14.9  PLT 352 219 154   Thyroid No results for input(s):  "TSH", "FREET4" in the last 168 hours.  BNPNo results for input(s): "BNP", "PROBNP" in the last 168 hours.  DDimer No results for input(s): "DDIMER" in the last 168 hours.   Radiology    ECHOCARDIOGRAM COMPLETE  Result Date: 12/07/2022    ECHOCARDIOGRAM REPORT   Patient Name:   [name] Date of Exam: 12/07/2022 Medical Rec #:  [id number]    Height:       64.0 in Accession #:    [id number]   Weight:       302.9 lb Date of Birth:  [birth year]     BSA:          2.334 m Patient Age:    57 years     BP:           133/67 mmHg Patient Gender: F            HR:           78 bpm. Exam Location:  Inpatient Procedure: 2D Echo, Color Doppler, Cardiac Doppler and Intracardiac            Opacification Agent Indications:    Shock  History:        Patient has no prior history of Echocardiogram examinations and                 Patient has prior history of Echocardiogram examinations, most                 recent 07/10/2021. Risk Factors:Diabetes, Dyslipidemia,                 Obstructive Sleep Apnea and Former Smoker.  Sonographer:    Raeford Razor Referring Phys: [phone] Clarene Critchley [facility]  Sonographer Comments: Suboptimal parasternal window, patient is obese and suboptimal subcostal window. IMPRESSIONS  1. Left ventricular ejection fraction, by estimation, is 30 to 35%. The left ventricle has moderately decreased function. The left ventricle demonstrates global hypokinesis. There is mild left ventricular hypertrophy. Indeterminate diastolic filling due  to E-A fusion.  2. Right ventricular systolic function is normal. The right ventricular size is normal.  3. The mitral valve is normal in structure. Trivial mitral valve regurgitation.  4. The aortic valve is tricuspid. Aortic valve regurgitation is not visualized.  5. The  inferior vena cava IVC not well visualized. Comparison(s): No significant change from prior study. FINDINGS  Left Ventricle: Left ventricular ejection fraction, by estimation, is 30 to 35%. The left ventricle has  moderately decreased function. The left ventricle demonstrates global hypokinesis. Definity contrast agent was given IV to delineate the left ventricular endocardial borders. The left ventricular internal cavity size was normal in size. There is mild left ventricular hypertrophy. Indeterminate diastolic filling due to E-A fusion. Right Ventricle: The right ventricular size is normal. Right ventricular systolic function is normal. Left Atrium: Left atrial size was normal in size. Right Atrium: Right atrial size was normal in size. Pericardium: There is no evidence of pericardial effusion. Mitral Valve: The mitral valve is normal in structure. Trivial mitral valve regurgitation. Tricuspid Valve: Tricuspid valve regurgitation is not demonstrated. Aortic Valve: The aortic valve is tricuspid. Aortic valve regurgitation is not visualized. Aortic valve mean gradient measures 4.0 mmHg. Aortic valve peak gradient measures 7.0 mmHg. Pulmonic Valve: Pulmonic valve regurgitation is not visualized. Aorta: The aortic root and ascending aorta are structurally normal, with no evidence of dilitation. Venous: The inferior vena cava IVC not well visualized. IAS/Shunts: The interatrial septum was not well visualized.  LEFT VENTRICLE PLAX 2D LVIDd:         4.70 cm      Diastology LVIDs:         3.90 cm      LV e' medial:    2.94 cm/s LV PW:         1.10 cm      LV E/e' medial:  14.1 LV IVS:        1.00 cm      LV e' lateral:   2.93 cm/s LVOT diam:     2.00 cm      LV E/e' lateral: 14.2 LVOT Area:     3.14 cm  LV Volumes (MOD) LV vol d, MOD A2C: 184.0 ml LV vol d, MOD A4C: 187.0 ml LV vol s, MOD A2C: 128.0 ml LV vol s, MOD A4C: 130.0 ml LV SV MOD A2C:     56.0 ml LV SV MOD A4C:     187.0 ml LV SV MOD BP:      58.1 ml RIGHT VENTRICLE RV Basal diam:  3.80 cm RV Mid diam:    3.30 cm RV S prime:     9.60 cm/s TAPSE (M-mode): 1.5 cm LEFT ATRIUM             Index        RIGHT ATRIUM           Index LA diam:        4.00 cm 1.71 cm/m   RA  Area:     14.40 cm LA Vol (A2C):   19.2 ml 8.23 ml/m   RA Volume:   34.40 ml  14.74 ml/m LA Vol (A4C):   31.0 ml 13.28 ml/m LA Biplane Vol: 26.1 ml 11.18 ml/m  AORTIC VALVE AV Area (Vmax): 1.53 cm AV Vmax:        132.00 cm/s AV Vmean:       87.400 cm/s AV VTI:         0.230 m AV Peak Grad:   7.0 mmHg AV Mean Grad:   4.0 mmHg LVOT Vmax:      64.30 cm/s  AORTA Ao Root diam: 2.70 cm Ao Asc diam:  3.00 cm MITRAL VALVE MV Area (PHT): 3.37 cm    SHUNTS MV Decel Time:  225 msec    Systemic Diam: 2.00 cm MV E velocity: 41.60 cm/s MV A velocity: 59.50 cm/s MV E/A ratio:  0.70 Photographer signed by Carolan Clines Signature Date/Time: 12/07/2022/2:19:27 PM    Final    DG CHEST PORT 1 VIEW  Result Date: 12/06/2022 CLINICAL DATA:  Central line placement EXAM: PORTABLE CHEST 1 VIEW COMPARISON:  12/05/2022 radiographs FINDINGS: The patient is rotated to the left on today's radiograph, reducing diagnostic sensitivity and specificity. Endotracheal tube tip 3.2 cm above the carina. A left central line is observed with tip projecting over the right atrium. Low lung volumes are present, causing crowding of the pulmonary vasculature. No pneumothorax observed. Suspected underlying mild enlargement of the cardiopericardial silhouette IMPRESSION: 1. Left central line tip projects over the right atrium. 2. Endotracheal tube tip 3.2 cm above the carina. 3. Low lung volumes. 4. Suspected underlying mild enlargement of the cardiopericardial silhouette. Electronically Signed   By: Gaylyn Rong M.D.   On: 12/06/2022 17:32    Cardiac Studies      Patient Profile     57 y.o. female admitted with VF arrest .  40 minutes of CPR with eventual ROSC Now waking up   Assessment & Plan      Cardiac arrest - continue amio for now Is on minimal support .   Cath discuss -  We discussed risks, benefits, options  She understands and agrees to proceed   I suspect that she will need an ICD placed.   Further  management per PCCM   2.  VF:   continue amio for now .    3. HFrEF:   EF remains moderately reduced with an LVEF of 30 to 35%. She has trivial mitral regurgitation            For questions or updates, please contact [facility] Please consult [email or web address] for contact info under        Signed, Kristeen Miss, MD  12/08/2022, 8:45 AM

## 2022-12-09 ENCOUNTER — Other Ambulatory Visit (HOSPITAL_COMMUNITY): Payer: Self-pay

## 2022-12-09 ENCOUNTER — Encounter (HOSPITAL_BASED_OUTPATIENT_CLINIC_OR_DEPARTMENT_OTHER): Payer: Self-pay | Admitting: Physical Therapy

## 2022-12-09 ENCOUNTER — Inpatient Hospital Stay (HOSPITAL_COMMUNITY)
Admission: EM | Disposition: A | Discharge status: Rehabilitation Facility | Payer: Self-pay | Source: Home / Self Care | Attending: Cardiology

## 2022-12-09 ENCOUNTER — Ambulatory Visit (HOSPITAL_BASED_OUTPATIENT_CLINIC_OR_DEPARTMENT_OTHER): Payer: Medicare Other

## 2022-12-09 DIAGNOSIS — R739 Hyperglycemia, unspecified: Secondary | ICD-10-CM | POA: Diagnosis not present

## 2022-12-09 DIAGNOSIS — I4901 Ventricular fibrillation: Secondary | ICD-10-CM | POA: Diagnosis not present

## 2022-12-09 DIAGNOSIS — I5021 Acute systolic (congestive) heart failure: Secondary | ICD-10-CM

## 2022-12-09 DIAGNOSIS — I469 Cardiac arrest, cause unspecified: Principal | ICD-10-CM

## 2022-12-09 HISTORY — PX: RIGHT/LEFT HEART CATH AND CORONARY ANGIOGRAPHY: CATH118266

## 2022-12-09 LAB — BASIC METABOLIC PANEL
Anion gap: 11 (ref 5–15)
BUN: 22 mg/dL — ABNORMAL HIGH (ref 6–20)
CO2: 23 mmol/L (ref 22–32)
Calcium: 8.3 mg/dL — ABNORMAL LOW (ref 8.9–10.3)
Chloride: 103 mmol/L (ref 98–111)
Creatinine, Ser: 1.57 mg/dL — ABNORMAL HIGH (ref 0.44–1.00)
GFR, Estimated: 38 mL/min — ABNORMAL LOW (ref >60)
Glucose, Bld: 119 mg/dL — ABNORMAL HIGH (ref 70–99)
Potassium: 3.8 mmol/L (ref 3.5–5.1)
Sodium: 137 mmol/L (ref 135–145)

## 2022-12-09 LAB — GLUCOSE, CAPILLARY
Glucose-Capillary: 105 mg/dL — ABNORMAL HIGH (ref 70–99)
Glucose-Capillary: 117 mg/dL — ABNORMAL HIGH (ref 70–99)
Glucose-Capillary: 127 mg/dL — ABNORMAL HIGH (ref 70–99)
Glucose-Capillary: 145 mg/dL — ABNORMAL HIGH (ref 70–99)
Glucose-Capillary: 113 mg/dL — ABNORMAL HIGH (ref 70–99)
Glucose-Capillary: 111 mg/dL — ABNORMAL HIGH (ref 70–99)

## 2022-12-09 LAB — POCT I-STAT 7, (LYTES, BLD GAS, ICA,H+H)
Acid-base deficit: 3 mmol/L — ABNORMAL HIGH (ref 0.0–2.0)
Acid-base deficit: 4 mmol/L — ABNORMAL HIGH (ref 0.0–2.0)
Bicarbonate: 23.2 mmol/L (ref 20.0–28.0)
Bicarbonate: 24.2 mmol/L (ref 20.0–28.0)
Calcium, Ion: 1.23 mmol/L (ref 1.15–1.40)
Calcium, Ion: 1.23 mmol/L (ref 1.15–1.40)
HCT: 38 % (ref 36.0–46.0)
HCT: 39 % (ref 36.0–46.0)
Hemoglobin: 12.9 g/dL (ref 12.0–15.0)
Hemoglobin: 13.3 g/dL (ref 12.0–15.0)
O2 Saturation: 66 %
O2 Saturation: 92 %
Potassium: 4.1 mmol/L (ref 3.5–5.1)
Potassium: 4.2 mmol/L (ref 3.5–5.1)
Sodium: 137 mmol/L (ref 135–145)
Sodium: 137 mmol/L (ref 135–145)
TCO2: 25 mmol/L (ref 22–32)
TCO2: 26 mmol/L (ref 22–32)
pCO2 arterial: 51 mm[Hg] — ABNORMAL HIGH (ref 32–48)
pCO2 arterial: 53.5 mm[Hg] — ABNORMAL HIGH (ref 32–48)
pH, Arterial: 7.263 — ABNORMAL LOW (ref 7.35–7.45)
pH, Arterial: 7.266 — ABNORMAL LOW (ref 7.35–7.45)
pO2, Arterial: 40 mm[Hg] — CL (ref 83–108)
pO2, Arterial: 74 mm[Hg] — ABNORMAL LOW (ref 83–108)

## 2022-12-09 LAB — CBC
HCT: 41.2 % (ref 36.0–46.0)
Hemoglobin: 13.3 g/dL (ref 12.0–15.0)
MCH: 27.9 pg (ref 26.0–34.0)
MCHC: 32.3 g/dL (ref 30.0–36.0)
MCV: 86.4 fL (ref 80.0–100.0)
Platelets: 159 10*3/uL (ref 150–400)
RBC: 4.77 MIL/uL (ref 3.87–5.11)
RDW: 14.8 % (ref 11.5–15.5)
WBC: 9.9 10*3/uL (ref 4.0–10.5)
nRBC: 0 % (ref 0.0–0.2)

## 2022-12-09 LAB — HEPATIC FUNCTION PANEL
ALT: 63 U/L — ABNORMAL HIGH (ref 0–44)
AST: 29 U/L (ref 15–41)
Albumin: 2.5 g/dL — ABNORMAL LOW (ref 3.5–5.0)
Alkaline Phosphatase: 33 U/L — ABNORMAL LOW (ref 38–126)
Bilirubin, Direct: 0.1 mg/dL (ref 0.0–0.2)
Indirect Bilirubin: 0.8 mg/dL (ref 0.3–0.9)
Total Bilirubin: 0.9 mg/dL (ref 0.3–1.2)
Total Protein: 5.8 g/dL — ABNORMAL LOW (ref 6.5–8.1)

## 2022-12-09 LAB — PHOSPHORUS: Phosphorus: 3.1 mg/dL (ref 2.5–4.6)

## 2022-12-09 LAB — MAGNESIUM: Magnesium: 1.9 mg/dL (ref 1.7–2.4)

## 2022-12-09 SURGERY — RIGHT/LEFT HEART CATH AND CORONARY ANGIOGRAPHY
Anesthesia: LOCAL

## 2022-12-09 MED ORDER — POTASSIUM CHLORIDE CRYS ER 20 MEQ PO TBCR
40.0000 meq | EXTENDED_RELEASE_TABLET | Freq: Once | ORAL | Status: AC
  Administered 2022-12-09: 40 meq via ORAL
  Filled 2022-12-09: qty 2

## 2022-12-09 MED ORDER — FENTANYL CITRATE (PF) 100 MCG/2ML IJ SOLN
INTRAMUSCULAR | Status: AC
  Filled 2022-12-09: qty 2

## 2022-12-09 MED ORDER — SODIUM CHLORIDE 0.9% FLUSH
3.0000 mL | Freq: Two times a day (BID) | INTRAVENOUS | Status: DC
  Administered 2022-12-09 – 2022-12-17 (×12): 3 mL via INTRAVENOUS

## 2022-12-09 MED ORDER — ALPRAZOLAM 0.5 MG PO TABS
0.5000 mg | ORAL_TABLET | Freq: Two times a day (BID) | ORAL | Status: DC | PRN
  Administered 2022-12-09: 0.5 mg via ORAL
  Filled 2022-12-09: qty 1

## 2022-12-09 MED ORDER — CARVEDILOL 6.25 MG PO TABS
6.2500 mg | ORAL_TABLET | Freq: Two times a day (BID) | ORAL | Status: AC
  Administered 2022-12-09 – 2022-12-11 (×5): 6.25 mg via ORAL
  Filled 2022-12-09 (×5): qty 1

## 2022-12-09 MED ORDER — SODIUM CHLORIDE 0.9 % IV SOLN: INTRAVENOUS | Status: AC

## 2022-12-09 MED ORDER — LIDOCAINE HCL (PF) 1 % IJ SOLN
INTRAMUSCULAR | Status: DC | PRN
  Administered 2022-12-09: 2 mL
  Administered 2022-12-09: 5 mL

## 2022-12-09 MED ORDER — HEPARIN (PORCINE) IN NACL 1000-0.9 UT/500ML-% IV SOLN
INTRAVENOUS | Status: DC | PRN
  Administered 2022-12-09 (×2): 500 mL

## 2022-12-09 MED ORDER — MAGNESIUM SULFATE 2 GM/50ML IV SOLN
2.0000 g | Freq: Once | INTRAVENOUS | Status: AC
  Administered 2022-12-09: 2 g via INTRAVENOUS
  Filled 2022-12-09: qty 50

## 2022-12-09 MED ORDER — HEPARIN SODIUM (PORCINE) 1000 UNIT/ML IJ SOLN
INTRAMUSCULAR | Status: DC | PRN
  Administered 2022-12-09: 4000 [IU] via INTRAVENOUS

## 2022-12-09 MED ORDER — SODIUM CHLORIDE 0.9% FLUSH: 3.0000 mL | INTRAVENOUS | Status: DC | PRN

## 2022-12-09 MED ORDER — HEPARIN SODIUM (PORCINE) 1000 UNIT/ML IJ SOLN
INTRAMUSCULAR | Status: AC
  Filled 2022-12-09: qty 10

## 2022-12-09 MED ORDER — LIDOCAINE HCL (PF) 1 % IJ SOLN
INTRAMUSCULAR | Status: AC
  Filled 2022-12-09: qty 30

## 2022-12-09 MED ORDER — MIDAZOLAM HCL 2 MG/2ML IJ SOLN
INTRAMUSCULAR | Status: DC | PRN
  Administered 2022-12-09: 2 mg via INTRAVENOUS

## 2022-12-09 MED ORDER — IOHEXOL 350 MG/ML SOLN
INTRAVENOUS | Status: DC | PRN
  Administered 2022-12-09: 80 mL via INTRA_ARTERIAL

## 2022-12-09 MED ORDER — MIDAZOLAM HCL 2 MG/2ML IJ SOLN
INTRAMUSCULAR | Status: AC
  Filled 2022-12-09: qty 2

## 2022-12-09 MED ORDER — VERAPAMIL HCL 2.5 MG/ML IV SOLN
INTRAVENOUS | Status: AC
  Filled 2022-12-09: qty 2

## 2022-12-09 MED ORDER — FENTANYL CITRATE (PF) 100 MCG/2ML IJ SOLN
INTRAMUSCULAR | Status: DC | PRN
  Administered 2022-12-09: 50 ug via INTRAVENOUS

## 2022-12-09 MED ORDER — CARVEDILOL 6.25 MG PO TABS: 6.2500 mg | ORAL_TABLET | Freq: Two times a day (BID) | ORAL | Status: DC

## 2022-12-09 SURGICAL SUPPLY — 16 items
CATH BALLN WEDGE 5F 110CM (CATHETERS) IMPLANT
CATH INFINITI 5 FR JL3.5 (CATHETERS) IMPLANT
CATH INFINITI 5FR AL1 (CATHETERS) IMPLANT
CATH INFINITI 5FR JK (CATHETERS) IMPLANT
CATH INFINITI 5FR JL4 (CATHETERS) IMPLANT
CATH LAUNCHER 5F JL3 (CATHETERS) IMPLANT
CATHETER LAUNCHER 5F JL3 (CATHETERS) ×1
DEVICE RAD COMP TR BAND LRG (VASCULAR PRODUCTS) IMPLANT
GLIDESHEATH SLEND A-KIT 6F 22G (SHEATH) IMPLANT
GUIDEWIRE INQWIRE 1.5J.035X260 (WIRE) IMPLANT
INQWIRE 1.5J .035X260CM (WIRE) ×1
MAT PREVALON FULL STRYKER (MISCELLANEOUS) IMPLANT
PACK CARDIAC CATHETERIZATION (CUSTOM PROCEDURE TRAY) ×2 IMPLANT
SET ATX-X65L (MISCELLANEOUS) IMPLANT
SHEATH GLIDE SLENDER 4/5FR (SHEATH) IMPLANT
SHEATH PROBE COVER 6X72 (BAG) IMPLANT

## 2022-12-09 NOTE — Progress Notes (Signed)
NAME:  [name], MRN:  [id number], DOB:  1965/08/30, LOS: 4 ADMISSION DATE:  12/05/2022, CONSULTATION DATE: 10/10 REFERRING MD: Dr. Particia Nearing, CHIEF COMPLAINT: Cardiac arrest  History of Present Illness:  57 year old female with past medical history as below, which is significant for hypertension, hyperlipidemia, CHF with EF 30 to 35% on GDMT, cardiac cath in 2018 showed no obstruction, DM, and morbid obesity who presented to [facility] emergency department on 10/10 after suffering cardiac arrest.  Patient was at home with her husband when she suffered a witnessed cardiac arrest.  She did have bystander CPR.  Upon EMS arrival she was found to be in VT/VF and ACLS was administered over over several arrest periods totaling up to 40 minutes.  Each arrest was VT/VF.  The patient received 6 defibrillations, 9 epi pushes, IV magnesium, and 2 doses of amiodarone.  Upon arrival to the emergency department she was intubated for airway protection.  She was evaluated by cardiology in the ED and PCCM was asked to evaluate for admission.  Pertinent  Medical History   Past Medical History:  Diagnosis Date   ADD (attention deficit disorder)    Anxiety    Arthritis    both knees right worse than left   Carpal tunnel syndrome of right wrist    Depression    Essential hypertension, benign    Gallstones    History of smoking 06/22/2016   Hyperlipidemia associated with type 2 diabetes mellitus (HCC), on Zocor 04/04/2011   Hypertension associated with diabetes (HCC) 06/03/2008   Migraines    Morbid obesity (HCC)    Morbid obesity with BMI of 50.0-59.9, adult (HCC) 07/12/2006   NICM (nonischemic cardiomyopathy) (HCC) 07/12/2006   01/16/18 ECHO:    - Procedure narrative: Transthoracic echocardiography. Image   quality was suboptimal. The study was technically difficult.   Intravenous contrast (Definity) was administered. - Left ventricle: The cavity size was moderately dilated. Wall   thickness was increased in a  pattern of mild LVH. Systolic   function was moderately to severely reduced. The estimated   ejection fraction w   Normal coronary arteries 04/17/2016   OSA on CPAP 10/28/2014   Spinal stenosis    back pain   Spondylosis without myelopathy or radiculopathy, lumbar region 12/04/2017   Type 2 diabetes mellitus with hyperglycemia [facility])    Past Surgical History:  Procedure Laterality Date   CHOLECYSTECTOMY     DILATATION & CURETTAGE/HYSTEROSCOPY WITH MYOSURE N/A 10/31/2017   Procedure: DILATATION & CURETTAGE/HYSTEROSCOPY WITH MYOSURE;  Surgeon: Romualdo Bolk, MD;  Location: [facility] ORS;  Service: Gynecology;  Laterality: N/A;   HYSTEROSCOPY WITH D & C N/A 05/07/2022   Procedure: DILATATION AND CURETTAGE /HYSTEROSCOPY;  Surgeon: [name], MD;  Location: [facility] OR;  Service: Gynecology;  Laterality: N/A;   RIGHT/LEFT HEART CATH AND CORONARY ANGIOGRAPHY N/A 04/11/2016   Procedure: Right/Left Heart Cath and Coronary Angiography;  Surgeon: Kathleene Hazel, MD;  Location: [facility] INVASIVE CV LAB;  Service: Cardiovascular;  Laterality: N/A;   sonogram for blood clots     no blockages   Significant Hospital Events: Including procedures, antibiotic start and stop dates in addition to other pertinent events   10/10 admitted s/p VF arrest 10/11.  Patient woke up and followed commands.  Tolerated SBT but remained vasopressor dependent.  Central line placed. 10/14 remains on Amio, for cath today  Interim History / Subjective:  Remains on Amiodarone at 30 SR per tele, rate 88 Extubated 10/12.  Complains of chest  pain on palpation from CPR.  Left leg has become discolored at site of I/O. Dressing is CCI For cardiac cath 10/14, add on >> she is NPO Net - 85 cc T Max 99.8 Poor short term memory  Objective   Blood pressure (!) 140/89, pulse 82, temperature 98.7 F (37.1 C), temperature source Oral, resp. rate 14, weight (!) 138.2 kg, SpO2 97%. CVP:  [3 mmHg-13 mmHg] 7 mmHg      Intake/Output  Summary (Last 24 hours) at 12/09/2022 0721 Last data filed at 12/09/2022 0700 Gross per 24 hour  Intake 531.73 ml  Output 1680 ml  Net -1148.27 ml   Filed Weights   12/07/22 0500 12/08/22 0706 12/09/22 0500  Weight: (!) 137.4 kg (!) 142.1 kg (!) 138.2 kg   Examination: General: Morbidly obese female, awake and alert, supine in bed HENT: Jud/AT, PERRL, No JVD Lungs: Bilateral chest excursion, diminished per bases , few rhonchi, otherwise clear, Chest tender to palpation.  Cardiovascular: RRR, no MRG Abdomen: Soft, Non-distended Extremities: Left leg area of blanching with duskiness. Some tenderness, dressing is CDI.  Neuro: Extubated and communicating normally. GU: Foley making urine  Ancillary test personally reviewed:  Na 137 ( 132) Mag 1.9, orders for repletion placed Creatinine has leveled at  further to 1.57 from 1.73 Mildly elevated transaminases-improving Troponin down trending>> not evaluated 10/14 Leukocytosis persists at 9.9 from 13.2. AST 29 from 41, ALT 63 from 83 Alk Phos 33 from 32 Lactate cleared 10/13 to 1.3 Negative UDS CT head negative. GI pathogen panel negative.  Blood cultures negative. Repeat echocardiogram 10/13  shows ejection fraction approximately 30%.   Overall appearance is unchanged from study from a year ago. Assessment & Plan:   Skin necrosis from possible prior extravasation of pressors from I/O removed 2 days ago. Blanching was not apparent then nor did appear to progress until now.  VF cardiac arrest on background of HFrEF with known EF 35%. Type II myocardial injury pattern secondary to cardiac arrest Distributive postcardiac arrest shock - now resolved.  Acute hypoxic respiratory failure.  Possible community-acquired pneumonia. Morbid obesity History of type 2 diabetes History of OSA on CPAP History of hypertension History of dyslipidemia.  Plan:  - Urgent orthopedics consultation. No plastics on-call.  - Spoke with [facility] who don't  feel compartment syndrome is likely given lack of calf pain and presence of pulses. They recommend wound care, allow injury to demarcate itself and eventual debridement and grafting if necessary. By plastic surgery.  - No role for phentolamine at this point. Extravasation had likely occurred soon after insertion and deep tissue damage occurred by the time central line was placed and I/O removed. >> wound care consulted, should see patient today 10/14. Currently Xeroform gauze and Kerlex -Hold on restarting GDMT until we have a potential operative plan. -Complete 7 days treatment for possible community-acquired pneumonia. -Acceptable glycemic control on insulin sliding scale. - Replete electrolytes as needed>> ordered by pharmacy 10/14 - Trend BMET, Mag and Phos - Maintain K > 4, Mag > 2 - CXR in am and prn to follow pneumonia - For cardiac cath today as add on   Best Practice (right click and "Reselect all SmartList Selections" daily)   Diet/type: NPO DVT prophylaxis: SCD - pork allergy GI prophylaxis: H2B Lines: N/A Foley:  Yes, and it is still needed Code Status:  full code Last date of multidisciplinary goals of care discussion [ ]  Unable to contact husband via phone  CRITICAL CARE Performed by: Maralyn Sago  F Angeni Chaudhuri   Total critical care time: 35 minutes  Critical care time was exclusive of separately billable procedures and treating other patients.  Critical care was necessary to treat or prevent imminent or life-threatening deterioration.  Critical care was time spent personally by me on the following activities: development of treatment plan with patient and/or surrogate as well as nursing, discussions with consultants, evaluation of patient's response to treatment, examination of patient, obtaining history from patient or surrogate, ordering and performing treatments and interventions, ordering and review of laboratory studies, ordering and review of radiographic studies, pulse  oximetry, re-evaluation of patient's condition and participation in multidisciplinary rounds.  Bevelyn Ngo, MSN, AGACNP-BC [facility] See Amion for personal pager PCCM on call pager [phone]  12/09/2022 7:21 AM

## 2022-12-09 NOTE — Progress Notes (Signed)
Progress Note  Patient Name: [name] Date of Encounter: 12/09/2022  Primary Cardiologist:   [name], MD   Subjective   Chest soreness.  No SOB.   Inpatient Medications    Scheduled Meds:  aspirin  81 mg Oral Daily   atorvastatin  80 mg Oral Daily   Chlorhexidine Gluconate Cloth  6 each Topical Daily   famotidine  20 mg Oral BID   heparin injection (subcutaneous)  5,000 Units Subcutaneous Q8H   insulin aspart  0-20 Units Subcutaneous Q4H   mouth rinse  15 mL Mouth Rinse 4 times per day   potassium chloride  40 mEq Oral Once   sodium chloride flush  10 mL Intravenous Q12H   Continuous Infusions:  sodium chloride 10 mL/hr at 12/09/22 0700   amiodarone 30 mg/hr (12/09/22 0700)   cefTRIAXone (ROCEPHIN)  IV Stopped (12/08/22 1003)   magnesium sulfate bolus IVPB     norepinephrine (LEVOPHED) Adult infusion Stopped (12/07/22 1747)   PRN Meds: acetaminophen **OR** acetaminophen (TYLENOL) oral liquid 160 mg/5 mL **OR** acetaminophen, fentaNYL (SUBLIMAZE) injection, hydrOXYzine, mouth rinse, oxyCODONE   Vital Signs    Vitals:   12/09/22 0400 12/09/22 0500 12/09/22 0600 12/09/22 0700  BP: (!) 148/82 (!) 144/83 (!) 142/78 (!) 140/89  Pulse: 86 84 83 82  Resp: 10 12 10 14   Temp:      TempSrc:      SpO2: 94% 95% 95% 97%  Weight:  (!) 138.2 kg      Intake/Output Summary (Last 24 hours) at 12/09/2022 0752 Last data filed at 12/09/2022 0700 Gross per 24 hour  Intake 531.73 ml  Output 1680 ml  Net -1148.27 ml   Filed Weights   12/07/22 0500 12/08/22 0706 12/09/22 0500  Weight: (!) 137.4 kg (!) 142.1 kg (!) 138.2 kg    Telemetry    RRR, sinus tach - Personally Reviewed  ECG    NA - Personally Reviewed  Physical Exam   GEN: No acute distress.   Neck: No  JVD Cardiac: RRR, no murmurs, rubs, or gallops.  Respiratory: Clear  to auscultation bilaterally. GI: Soft, nontender, non-distended  MS: No  edema; No deformity. Neuro:  Nonfocal  Psych:  Normal affect   Labs    Chemistry Recent Labs  Lab 12/07/22 0334 12/08/22 0509 12/09/22 0311  NA 136 132* 137  K 2.9* 3.6 3.8  CL 104 102 103  CO2 20* 21* 23  GLUCOSE 174* 121* 119*  BUN 27* 29* 22*  CREATININE 1.71* 1.73* 1.57*  CALCIUM 8.0* 7.5* 8.3*  PROT 6.3* 5.7* 5.8*  ALBUMIN 2.6* 2.5* 2.5*  AST 76* 41 29  ALT 134* 83* 63*  ALKPHOS 36* 32* 33*  BILITOT 1.0 0.8 0.9  GFRNONAA 35* 34* 38*  ANIONGAP 12 9 11      Hematology Recent Labs  Lab 12/07/22 0334 12/08/22 0509 12/09/22 0311  WBC 19.6* 13.2* 9.9  RBC 5.44* 4.76 4.77  HGB 15.1* 13.2 13.3  HCT 45.1 40.5 41.2  MCV 82.9 85.1 86.4  MCH 27.8 27.7 27.9  MCHC 33.5 32.6 32.3  RDW 14.6 14.9 14.8  PLT 219 154 159    Cardiac EnzymesNo results for input(s): "TROPONINI" in the last 168 hours. No results for input(s): "TROPIPOC" in the last 168 hours.   BNPNo results for input(s): "BNP", "PROBNP" in the last 168 hours.   DDimer No results for input(s): "DDIMER" in the last 168 hours.   Radiology    ECHOCARDIOGRAM COMPLETE  Result  Date: 12/07/2022    ECHOCARDIOGRAM REPORT   Patient Name:   SKYLEIGH JOVEL Date of Exam: 12/07/2022 Medical Rec #:  [id number]    Height:       64.0 in Accession #:    [id number]   Weight:       302.9 lb Date of Birth:  12/18/1965     BSA:          2.334 m Patient Age:    57 years     BP:           133/67 mmHg Patient Gender: F            HR:           78 bpm. Exam Location:  Inpatient Procedure: 2D Echo, Color Doppler, Cardiac Doppler and Intracardiac            Opacification Agent Indications:    Shock  History:        Patient has no prior history of Echocardiogram examinations and                 Patient has prior history of Echocardiogram examinations, most                 recent 07/10/2021. Risk Factors:Diabetes, Dyslipidemia,                 Obstructive Sleep Apnea and Former Smoker.  Sonographer:    Raeford Razor Referring Phys: [id number] Clarene Critchley [facility]  Sonographer Comments: Suboptimal parasternal  window, patient is obese and suboptimal subcostal window. IMPRESSIONS  1. Left ventricular ejection fraction, by estimation, is 30 to 35%. The left ventricle has moderately decreased function. The left ventricle demonstrates global hypokinesis. There is mild left ventricular hypertrophy. Indeterminate diastolic filling due  to E-A fusion.  2. Right ventricular systolic function is normal. The right ventricular size is normal.  3. The mitral valve is normal in structure. Trivial mitral valve regurgitation.  4. The aortic valve is tricuspid. Aortic valve regurgitation is not visualized.  5. The inferior vena cava IVC not well visualized. Comparison(s): No significant change from prior study. FINDINGS  Left Ventricle: Left ventricular ejection fraction, by estimation, is 30 to 35%. The left ventricle has moderately decreased function. The left ventricle demonstrates global hypokinesis. Definity contrast agent was given IV to delineate the left ventricular endocardial borders. The left ventricular internal cavity size was normal in size. There is mild left ventricular hypertrophy. Indeterminate diastolic filling due to E-A fusion. Right Ventricle: The right ventricular size is normal. Right ventricular systolic function is normal. Left Atrium: Left atrial size was normal in size. Right Atrium: Right atrial size was normal in size. Pericardium: There is no evidence of pericardial effusion. Mitral Valve: The mitral valve is normal in structure. Trivial mitral valve regurgitation. Tricuspid Valve: Tricuspid valve regurgitation is not demonstrated. Aortic Valve: The aortic valve is tricuspid. Aortic valve regurgitation is not visualized. Aortic valve mean gradient measures 4.0 mmHg. Aortic valve peak gradient measures 7.0 mmHg. Pulmonic Valve: Pulmonic valve regurgitation is not visualized. Aorta: The aortic root and ascending aorta are structurally normal, with no evidence of dilitation. Venous: The inferior vena cava IVC  not well visualized. IAS/Shunts: The interatrial septum was not well visualized.  LEFT VENTRICLE PLAX 2D LVIDd:         4.70 cm      Diastology LVIDs:         3.90 cm      LV e' medial:  2.94 cm/s LV PW:         1.10 cm      LV E/e' medial:  14.1 LV IVS:        1.00 cm      LV e' lateral:   2.93 cm/s LVOT diam:     2.00 cm      LV E/e' lateral: 14.2 LVOT Area:     3.14 cm  LV Volumes (MOD) LV vol d, MOD A2C: 184.0 ml LV vol d, MOD A4C: 187.0 ml LV vol s, MOD A2C: 128.0 ml LV vol s, MOD A4C: 130.0 ml LV SV MOD A2C:     56.0 ml LV SV MOD A4C:     187.0 ml LV SV MOD BP:      58.1 ml RIGHT VENTRICLE RV Basal diam:  3.80 cm RV Mid diam:    3.30 cm RV S prime:     9.60 cm/s TAPSE (M-mode): 1.5 cm LEFT ATRIUM             Index        RIGHT ATRIUM           Index LA diam:        4.00 cm 1.71 cm/m   RA Area:     14.40 cm LA Vol (A2C):   19.2 ml 8.23 ml/m   RA Volume:   34.40 ml  14.74 ml/m LA Vol (A4C):   31.0 ml 13.28 ml/m LA Biplane Vol: 26.1 ml 11.18 ml/m  AORTIC VALVE AV Area (Vmax): 1.53 cm AV Vmax:        132.00 cm/s AV Vmean:       87.400 cm/s AV VTI:         0.230 m AV Peak Grad:   7.0 mmHg AV Mean Grad:   4.0 mmHg LVOT Vmax:      64.30 cm/s  AORTA Ao Root diam: 2.70 cm Ao Asc diam:  3.00 cm MITRAL VALVE MV Area (PHT): 3.37 cm    SHUNTS MV Decel Time: 225 msec    Systemic Diam: 2.00 cm MV E velocity: 41.60 cm/s MV A velocity: 59.50 cm/s MV E/A ratio:  0.70 Photographer signed by Carolan Clines Signature Date/Time: 12/07/2022/2:19:27 PM    Final     Cardiac Studies   See echo above.   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  Now status post cardiac arrest.    Assessment & Plan    Cardiac arrest:  On IV amiodarone.  Right and left heart cath today.   Likely primary arrhythmic and will benefit from ICD.  BP improving off of pressors.  I will resume low dose Coreg.    Supplement potassium and discontinue IV amiodarone.    Nonischemic cardiomyopathy:  Previous  class I symptoms.. She has not had significant LVH  or evidence of other etiology.   Plan cardiac cath.  Consider MRI.  Slowly begin to resume GDMT after cath.     AKI:  Creat is still up slightly.    Hold off on Entresto  For questions or updates, please contact [facility] Please consult [email or web address] for contact info under Cardiology/STEMI.   Signed, [name], MD  12/09/2022, 7:52 AM

## 2022-12-09 NOTE — Progress Notes (Signed)
Returned from cath lab. Bedside report received. TR band to right wrist with 15cc present. Hand has slow cap refill and is dusky. Reverse barbeau has D waveform. Total of 2cc removed from band with return of C waveform and improved color to her hand. Will closely monitor.

## 2022-12-09 NOTE — Progress Notes (Signed)
Pt doesn't want to wear CPAP for the night, pt on 4L [place]. No distress noted.

## 2022-12-09 NOTE — TOC Benefit Eligibility Note (Signed)
Patient Product/process development scientist completed.    The patient is insured through [employer]. Patient has Medicare and is not eligible for a copay card, but may be able to apply for patient assistance, if available.    Ran test claim for Entresto 24-26 mg and the current 30 day co-pay is $0.00.  Ran test claim for Farxiga 10 mg and the current 30 day co-pay is $0.00.  This test claim was processed through [facility]- copay amounts may vary at other pharmacies due to pharmacy/plan contracts, or as the patient moves through the different stages of their insurance plan.     Roland Earl, CPHT Pharmacy Technician III Certified Patient Advocate [facility] Patient Advocate Team Direct Number: [phone]  Fax: [phone]

## 2022-12-09 NOTE — Progress Notes (Signed)
Inpatient Rehab Admissions Coordinator:   Per therapy recommendations, patient was screened for CIR candidacy by Megan Salon, MS, CCC-SLP . At this time, Pt. is not yet at a level to tolerate the intensity of CIR. However,   Pt. may have potential to progress to becoming a potential CIR candidate, so CIR admissions team will follow and monitor for progress and participation with therapies and place consult order if Pt. appears to be an appropriate candidate. Please contact me with any questions.   Megan Salon, MS, CCC-SLP Rehab Admissions Coordinator  [phone] (celll) [phone] (office)

## 2022-12-09 NOTE — Progress Notes (Signed)
Physical Therapy Treatment Patient Details Name: [name] MRN: [id number] DOB: 02-Jun-1965 Today's Date: 12/09/2022   History of Present Illness 57 year old female admitted 10/10 after suffering cardiac arrest + bystander CPR and eventual ROSC after 40 minutes.  Past medical history is significant for hypertension, hyperlipidemia, CHF with EF 30 to 35% on GDMT, DM, and morbid obesity, arthritis, ADD, OSA on CPAP, SS/back pain, DM2.    PT Comments  With max encouragement pt able to transfer to EOB then OOB to chair. Pt very anxious and highly distractible requiring constant verbal cues to stay on task. Pt continues with chest soreness from CPR and L LE pain from wound. Acute PT to cont to follow.    If plan is discharge home, recommend the following: Two people to help with walking and/or transfers;Assist for transportation;Two people to help with bathing/dressing/bathroom;Help with stairs or ramp for entrance   Can travel by private vehicle        Equipment Recommendations  Other (comment) (TBA)    Recommendations for Other Services       Precautions / Restrictions Precautions Precautions: Fall Restrictions Weight Bearing Restrictions: No     Mobility  Bed Mobility Overal bed mobility: Needs Assistance Bed Mobility: Supine to Sit     Supine to sit: Max assist     General bed mobility comments: multimodal cues and assist for all portions    Transfers Overall transfer level: Needs assistance Equipment used: Rolling walker (2 wheels) Transfers: Sit to/from Stand, Bed to chair/wheelchair/BSC Sit to Stand: Min assist, +2 safety/equipment   Step pivot transfers: Min assist, +2 safety/equipment, From elevated surface       General transfer comment: Needing encouragement for attempt and cues for hand placement, increased time, easily distractible    Ambulation/Gait                   Stairs             Wheelchair Mobility     Tilt Bed     Modified Rankin (Stroke Patients Only)       Balance Overall balance assessment: Needs assistance Sitting-balance support: No upper extremity supported, Feet supported, Bilateral upper extremity supported, Single extremity supported Sitting balance-Leahy Scale: Fair Sitting balance - Comments: CGA approaching supervision static sitting EOB   Standing balance support: Bilateral upper extremity supported, During functional activity Standing balance-Leahy Scale: Poor Standing balance comment: reliant on RW                            Cognition Arousal: Alert Behavior During Therapy: WFL for tasks assessed/performed Overall Cognitive Status: Impaired/Different from baseline Area of Impairment: Orientation, Attention, Memory, Following commands, Safety/judgement, Awareness, Problem solving                 Orientation Level: Disoriented to, Time Current Attention Level: Focused, Sustained (highly distractible) Memory: Decreased recall of precautions, Decreased short-term memory Following Commands: Follows one step commands consistently, Follows one step commands with increased time Safety/Judgement: Decreased awareness of safety Awareness: Intellectual, Emergent Problem Solving: Slow processing, Requires verbal cues General Comments: pt with noted anxiety re: moving, responded well to therapist        Exercises      General Comments General comments (skin integrity, edema, etc.): pt with wound on L shin, covered in dressing      Pertinent Vitals/Pain Pain Assessment Pain Assessment: Faces Faces Pain Scale: Hurts even more Pain Location: chest, [name] Pain  Descriptors / Indicators: Guarding, Grimacing, Moaning Pain Intervention(s): Monitored during session    Home Living                          Prior Function            PT Goals (current goals can now be found in the care plan section) Acute Rehab PT Goals Patient Stated Goal: to get  stronger, go home PT Goal Formulation: With patient/family Time For Goal Achievement: 12/22/22 Potential to Achieve Goals: Good Progress towards PT goals: Progressing toward goals    Frequency    Min 1X/week      PT Plan      Co-evaluation PT/OT/SLP Co-Evaluation/Treatment: Yes Reason for Co-Treatment: For patient/therapist safety PT goals addressed during session: Mobility/safety with mobility        AM-PAC PT "6 Clicks" Mobility   Outcome Measure  Help needed turning from your back to your side while in a flat bed without using bedrails?: A Lot Help needed moving from lying on your back to sitting on the side of a flat bed without using bedrails?: A Lot Help needed moving to and from a bed to a chair (including a wheelchair)?: A Little Help needed standing up from a chair using your arms (e.g., wheelchair or bedside chair)?: A Little Help needed to walk in hospital room?: A Lot Help needed climbing 3-5 steps with a railing? : Total 6 Click Score: 13    End of Session Equipment Utilized During Treatment: Oxygen Activity Tolerance: Patient limited by pain Patient left: with call bell/phone within reach;in chair;with chair alarm set;with nursing/sitter in room Nurse Communication: Mobility status PT Visit Diagnosis: Muscle weakness (generalized) (M62.81);Other abnormalities of gait and mobility (R26.89);Pain Pain - Right/Left: Left Pain - part of body: Knee (& chest)     Time: 1610-9604 PT Time Calculation (min) (ACUTE ONLY): 41 min  Charges:    $Therapeutic Activity: 8-22 mins PT General Charges $$ ACUTE PT VISIT: 1 Visit                     Lewis Shock, PT, DPT Acute Rehabilitation Services Secure chat preferred Office #: [phone]    Iona Hansen 12/09/2022, 7:21 PM

## 2022-12-09 NOTE — Consult Note (Addendum)
WOC Nurse Consult Note: Reason for Consult: Consult requested for left leg.  Pt previously had an intraosseous IV inserted to the left knee which infiltrated with vasopressors, according to the bedside nurse.  Left knee previous insertion site is black adhered eschar, 1X1cm Large blood-filled blisters surrounding to left outer thigh and extending to left calf.  Generalized edema and erythremia.  De-roofed all the blisters using scissors and forceps and removed large amt loose gelatinous material, revealing red moist skin; 37X26X.1cm.  Pt was medicated for pain and tolerated with mod amt discomfort. Large amt pink-yellow fluid drained and relieved the pressure. 90% red, 10% black. Dressing procedure/placement/frequency: Topical treatment orders provided for bedside nurses to perform as follows to absorb drainage and promote healing: Apply Xeroform gauze to left leg wound Q day, then cover with ABD pads and kerlex. Please re-consult if further assistance is needed.  Thank-you,  Cammie Mcgee MSN, RN, CWOCN, [place], CNS [phone]

## 2022-12-09 NOTE — Evaluation (Signed)
Occupational Therapy Evaluation Patient Details Name: [name] MRN: [id number] DOB: [birth year] Today's Date: 12/09/2022   History of Present Illness 57 year old female admitted 10/10 after suffering cardiac arrest + bystander CPR and eventual ROSC after 40 minutes.  Past medical history is significant for hypertension, hyperlipidemia, CHF with EF 30 to 35% on GDMT, DM, and morbid obesity, arthritis, ADD, OSA on CPAP, SS/back pain, DM2.   Clinical Impression   PTA, pt lived with husband who assisted in ADL; at baseline, pt participating in land and water therapies at [facility]. Upon eval, pt presents with decreased activity tolerance, BUE ROM, strength, cardiopulmonary status, balance, and cognition. Pt performing UB ADL with up to mod A and LB ADL with total A this session. Pt with good effort throughout and able to transfer with min A of 2 and min encouragement. Pt with good rehab potential and to benefit from intensive inpatient rehab >3 hours/day to optimize functional independence in ADL and IADL.       If plan is discharge home, recommend the following: Two people to help with walking and/or transfers;A lot of help with bathing/dressing/bathroom;Assistance with cooking/housework;Direct supervision/assist for medications management;Assist for transportation;Direct supervision/assist for financial management;Help with stairs or ramp for entrance    Functional Status Assessment  Patient has had a recent decline in their functional status and demonstrates the ability to make significant improvements in function in a reasonable and predictable amount of time.  Equipment Recommendations  Other (comment) (defer)    Recommendations for Other Services Rehab consult     Precautions / Restrictions Precautions Precautions: Fall Restrictions Weight Bearing Restrictions: No      Mobility Bed Mobility Overal bed mobility: Needs Assistance Bed Mobility: Supine to Sit     Supine to sit:  Max assist     General bed mobility comments: multimodal cues and assist for all portions    Transfers Overall transfer level: Needs assistance Equipment used: Rolling walker (2 wheels) Transfers: Sit to/from Stand, Bed to chair/wheelchair/BSC Sit to Stand: Min assist, +2 safety/equipment     Step pivot transfers: Min assist, +2 safety/equipment, From elevated surface     General transfer comment: Needing encouragement for attempt and cues for hand placment      Balance Overall balance assessment: Needs assistance Sitting-balance support: No upper extremity supported, Feet supported, Bilateral upper extremity supported, Single extremity supported   Sitting balance - Comments: CGA approaching supervision static sitting EOB   Standing balance support: Bilateral upper extremity supported, During functional activity Standing balance-Leahy Scale: Poor Standing balance comment: reliant on RW                           ADL either performed or assessed with clinical judgement   ADL Overall ADL's : Needs assistance/impaired     Grooming: Set up;Sitting;Oral care;Wash/dry face Grooming Details (indicate cue type and reason): increased time to initiate Upper Body Bathing: Moderate assistance;Sitting   Lower Body Bathing: Maximal assistance;Sit to/from stand   Upper Body Dressing : Moderate assistance;Sitting Upper Body Dressing Details (indicate cue type and reason): discomfort with UE mobility Lower Body Dressing: Maximal assistance;Sit to/from stand   Toilet Transfer: Minimal assistance;+2 for physical assistance;+2 for safety/equipment;Stand-pivot;Rolling walker (2 wheels)           Functional mobility during ADLs: Minimal assistance;+2 for physical assistance;+2 for safety/equipment (for SPT)       Vision Baseline Vision/History: 1 Wears glasses (intermittently) Ability to See in Adequate Light: 0 Adequate  Patient Visual Report: No change from  baseline Vision Assessment?: No apparent visual deficits Additional Comments: for BADL; not formally tested     Perception Perception: Not tested       Praxis Praxis: Not tested       Pertinent Vitals/Pain Pain Assessment Pain Assessment: Faces Faces Pain Scale: Hurts even more Pain Location: chest, [name] Pain Descriptors / Indicators: Guarding, Grimacing, Moaning Pain Intervention(s): Limited activity within patient's tolerance, Monitored during session     Extremity/Trunk Assessment Upper Extremity Assessment Upper Extremity Assessment: RUE deficits/detail;LUE deficits/detail;Right hand dominant RUE Deficits / Details: AAROM with limited shoulder elevation with chest pain about 110 degrees, elbow/wrist WFL, strength at lest 3/5 not formally tested due to pain LUE Deficits / Details: AAROM with limited shoulder elevation with chest pain about 110 degrees, elbow/wrist WFL, strength at lest 3/5 not formally tested due to pain   Lower Extremity Assessment Lower Extremity Assessment: Defer to PT evaluation       Communication Communication Communication: No apparent difficulties   Cognition Arousal: Alert Behavior During Therapy: WFL for tasks assessed/performed Overall Cognitive Status: Impaired/Different from baseline Area of Impairment: Orientation, Attention, Memory, Following commands, Safety/judgement, Awareness, Problem solving                 Orientation Level: Disoriented to, Time Current Attention Level: Focused, Sustained (highly distractible) Memory: Decreased recall of precautions, Decreased short-term memory Following Commands: Follows one step commands consistently, Follows one step commands with increased time Safety/Judgement: Decreased awareness of safety Awareness: Intellectual, Emergent Problem Solving: Slow processing, Requires verbal cues       General Comments       Exercises     Shoulder Instructions      Home Living Family/patient  expects to be discharged to:: Private residence Living Arrangements: Spouse/significant other Available Help at Discharge: Family   Home Access: Stairs to enter Entrance Stairs-Number of Steps: 1   Home Layout: Two level Alternate Level Stairs-Number of Steps: flight Alternate Level Stairs-Rails: Left;Right Bathroom Shower/Tub: Chief Strategy Officer: Standard     Home Equipment: Rollator (4 wheels);Electric scooter   Additional Comments: uses scooter if going to a large store      Prior Functioning/Environment Prior Level of Function : Needs assist             Mobility Comments: does pool and land therapy at [facility] ADLs Comments: spouse does all IADL's        OT Problem List: Decreased strength;Decreased activity tolerance;Impaired balance (sitting and/or standing);Decreased cognition;Decreased safety awareness;Decreased knowledge of use of DME or AE      OT Treatment/Interventions: Self-care/ADL training;Therapeutic exercise;DME and/or AE instruction;Balance training;Patient/family education;Therapeutic activities;Cognitive remediation/compensation    OT Goals(Current goals can be found in the care plan section) Acute Rehab OT Goals Patient Stated Goal: get better OT Goal Formulation: With patient Time For Goal Achievement: 12/23/22 Potential to Achieve Goals: Good  OT Frequency: Min 1X/week    Co-evaluation              AM-PAC OT "6 Clicks" Daily Activity     Outcome Measure Help from another person eating meals?: Total Help from another person taking care of personal grooming?: A Little Help from another person toileting, which includes using toliet, bedpan, or urinal?: A Lot Help from another person bathing (including washing, rinsing, drying)?: A Lot Help from another person to put on and taking off regular upper body clothing?: A Lot Help from another person to put on and taking  off regular lower body clothing?: Total 6 Click Score:  11   End of Session Equipment Utilized During Treatment: Gait belt;Rolling walker (2 wheels) Nurse Communication: Mobility status  Activity Tolerance: Patient tolerated treatment well Patient left: in chair;with call bell/phone within reach;with chair alarm set  OT Visit Diagnosis: Unsteadiness on feet (R26.81);Muscle weakness (generalized) (M62.81);Other symptoms and signs involving cognitive function                Time: 1002-1050 OT Time Calculation (min): 48 min Charges:  OT General Charges $OT Visit: 1 Visit OT Evaluation $OT Eval Moderate Complexity: 1 Mod OT Treatments $Self Care/Home Management : 8-22 mins  Tyler Deis, OTR/L [facility] Acute [facility] Office: [phone]   [name] 12/09/2022, 2:30 PM

## 2022-12-09 NOTE — H&P (View-Only) (Signed)
Progress Note  Patient Name: [name] Date of Encounter: 12/09/2022  Primary Cardiologist:   [name], MD   Subjective   Chest soreness.  No SOB.   Inpatient Medications    Scheduled Meds:  aspirin  81 mg Oral Daily   atorvastatin  80 mg Oral Daily   Chlorhexidine Gluconate Cloth  6 each Topical Daily   famotidine  20 mg Oral BID   heparin injection (subcutaneous)  5,000 Units Subcutaneous Q8H   insulin aspart  0-20 Units Subcutaneous Q4H   mouth rinse  15 mL Mouth Rinse 4 times per day   potassium chloride  40 mEq Oral Once   sodium chloride flush  10 mL Intravenous Q12H   Continuous Infusions:  sodium chloride 10 mL/hr at 12/09/22 0700   amiodarone 30 mg/hr (12/09/22 0700)   cefTRIAXone (ROCEPHIN)  IV Stopped (12/08/22 1003)   magnesium sulfate bolus IVPB     norepinephrine (LEVOPHED) Adult infusion Stopped (12/07/22 1747)   PRN Meds: acetaminophen **OR** acetaminophen (TYLENOL) oral liquid 160 mg/5 mL **OR** acetaminophen, fentaNYL (SUBLIMAZE) injection, hydrOXYzine, mouth rinse, oxyCODONE   Vital Signs    Vitals:   12/09/22 0400 12/09/22 0500 12/09/22 0600 12/09/22 0700  BP: (!) 148/82 (!) 144/83 (!) 142/78 (!) 140/89  Pulse: 86 84 83 82  Resp: 10 12 10 14   Temp:      TempSrc:      SpO2: 94% 95% 95% 97%  Weight:  (!) 138.2 kg      Intake/Output Summary (Last 24 hours) at 12/09/2022 0752 Last data filed at 12/09/2022 0700 Gross per 24 hour  Intake 531.73 ml  Output 1680 ml  Net -1148.27 ml   Filed Weights   12/07/22 0500 12/08/22 0706 12/09/22 0500  Weight: (!) 137.4 kg (!) 142.1 kg (!) 138.2 kg    Telemetry    RRR, sinus tach - Personally Reviewed  ECG    NA - Personally Reviewed  Physical Exam   GEN: No acute distress.   Neck: No  JVD Cardiac: RRR, no murmurs, rubs, or gallops.  Respiratory: Clear  to auscultation bilaterally. GI: Soft, nontender, non-distended  MS: No  edema; No deformity. Neuro:  Nonfocal  Psych:  Normal affect   Labs    Chemistry Recent Labs  Lab 12/07/22 0334 12/08/22 0509 12/09/22 0311  NA 136 132* 137  K 2.9* 3.6 3.8  CL 104 102 103  CO2 20* 21* 23  GLUCOSE 174* 121* 119*  BUN 27* 29* 22*  CREATININE 1.71* 1.73* 1.57*  CALCIUM 8.0* 7.5* 8.3*  PROT 6.3* 5.7* 5.8*  ALBUMIN 2.6* 2.5* 2.5*  AST 76* 41 29  ALT 134* 83* 63*  ALKPHOS 36* 32* 33*  BILITOT 1.0 0.8 0.9  GFRNONAA 35* 34* 38*  ANIONGAP 12 9 11      Hematology Recent Labs  Lab 12/07/22 0334 12/08/22 0509 12/09/22 0311  WBC 19.6* 13.2* 9.9  RBC 5.44* 4.76 4.77  HGB 15.1* 13.2 13.3  HCT 45.1 40.5 41.2  MCV 82.9 85.1 86.4  MCH 27.8 27.7 27.9  MCHC 33.5 32.6 32.3  RDW 14.6 14.9 14.8  PLT 219 154 159    Cardiac EnzymesNo results for input(s): "TROPONINI" in the last 168 hours. No results for input(s): "TROPIPOC" in the last 168 hours.   BNPNo results for input(s): "BNP", "PROBNP" in the last 168 hours.   DDimer No results for input(s): "DDIMER" in the last 168 hours.   Radiology    ECHOCARDIOGRAM COMPLETE  Result  Date: 12/07/2022    ECHOCARDIOGRAM REPORT   Patient Name:   SKYLEIGH JOVEL Date of Exam: 12/07/2022 Medical Rec #:  [id number]    Height:       64.0 in Accession #:    [id number]   Weight:       302.9 lb Date of Birth:  12/18/1965     BSA:          2.334 m Patient Age:    57 years     BP:           133/67 mmHg Patient Gender: F            HR:           78 bpm. Exam Location:  Inpatient Procedure: 2D Echo, Color Doppler, Cardiac Doppler and Intracardiac            Opacification Agent Indications:    Shock  History:        Patient has no prior history of Echocardiogram examinations and                 Patient has prior history of Echocardiogram examinations, most                 recent 07/10/2021. Risk Factors:Diabetes, Dyslipidemia,                 Obstructive Sleep Apnea and Former Smoker.  Sonographer:    Raeford Razor Referring Phys: [id number] Clarene Critchley [facility]  Sonographer Comments: Suboptimal parasternal  window, patient is obese and suboptimal subcostal window. IMPRESSIONS  1. Left ventricular ejection fraction, by estimation, is 30 to 35%. The left ventricle has moderately decreased function. The left ventricle demonstrates global hypokinesis. There is mild left ventricular hypertrophy. Indeterminate diastolic filling due  to E-A fusion.  2. Right ventricular systolic function is normal. The right ventricular size is normal.  3. The mitral valve is normal in structure. Trivial mitral valve regurgitation.  4. The aortic valve is tricuspid. Aortic valve regurgitation is not visualized.  5. The inferior vena cava IVC not well visualized. Comparison(s): No significant change from prior study. FINDINGS  Left Ventricle: Left ventricular ejection fraction, by estimation, is 30 to 35%. The left ventricle has moderately decreased function. The left ventricle demonstrates global hypokinesis. Definity contrast agent was given IV to delineate the left ventricular endocardial borders. The left ventricular internal cavity size was normal in size. There is mild left ventricular hypertrophy. Indeterminate diastolic filling due to E-A fusion. Right Ventricle: The right ventricular size is normal. Right ventricular systolic function is normal. Left Atrium: Left atrial size was normal in size. Right Atrium: Right atrial size was normal in size. Pericardium: There is no evidence of pericardial effusion. Mitral Valve: The mitral valve is normal in structure. Trivial mitral valve regurgitation. Tricuspid Valve: Tricuspid valve regurgitation is not demonstrated. Aortic Valve: The aortic valve is tricuspid. Aortic valve regurgitation is not visualized. Aortic valve mean gradient measures 4.0 mmHg. Aortic valve peak gradient measures 7.0 mmHg. Pulmonic Valve: Pulmonic valve regurgitation is not visualized. Aorta: The aortic root and ascending aorta are structurally normal, with no evidence of dilitation. Venous: The inferior vena cava IVC  not well visualized. IAS/Shunts: The interatrial septum was not well visualized.  LEFT VENTRICLE PLAX 2D LVIDd:         4.70 cm      Diastology LVIDs:         3.90 cm      LV e' medial:  2.94 cm/s LV PW:         1.10 cm      LV E/e' medial:  14.1 LV IVS:        1.00 cm      LV e' lateral:   2.93 cm/s LVOT diam:     2.00 cm      LV E/e' lateral: 14.2 LVOT Area:     3.14 cm  LV Volumes (MOD) LV vol d, MOD A2C: 184.0 ml LV vol d, MOD A4C: 187.0 ml LV vol s, MOD A2C: 128.0 ml LV vol s, MOD A4C: 130.0 ml LV SV MOD A2C:     56.0 ml LV SV MOD A4C:     187.0 ml LV SV MOD BP:      58.1 ml RIGHT VENTRICLE RV Basal diam:  3.80 cm RV Mid diam:    3.30 cm RV S prime:     9.60 cm/s TAPSE (M-mode): 1.5 cm LEFT ATRIUM             Index        RIGHT ATRIUM           Index LA diam:        4.00 cm 1.71 cm/m   RA Area:     14.40 cm LA Vol (A2C):   19.2 ml 8.23 ml/m   RA Volume:   34.40 ml  14.74 ml/m LA Vol (A4C):   31.0 ml 13.28 ml/m LA Biplane Vol: 26.1 ml 11.18 ml/m  AORTIC VALVE AV Area (Vmax): 1.53 cm AV Vmax:        132.00 cm/s AV Vmean:       87.400 cm/s AV VTI:         0.230 m AV Peak Grad:   7.0 mmHg AV Mean Grad:   4.0 mmHg LVOT Vmax:      64.30 cm/s  AORTA Ao Root diam: 2.70 cm Ao Asc diam:  3.00 cm MITRAL VALVE MV Area (PHT): 3.37 cm    SHUNTS MV Decel Time: 225 msec    Systemic Diam: 2.00 cm MV E velocity: 41.60 cm/s MV A velocity: 59.50 cm/s MV E/A ratio:  0.70 Photographer signed by Carolan Clines Signature Date/Time: 12/07/2022/2:19:27 PM    Final     Cardiac Studies   See echo above.   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  Now status post cardiac arrest.    Assessment & Plan    Cardiac arrest:  On IV amiodarone.  Right and left heart cath today.   Likely primary arrhythmic and will benefit from ICD.  BP improving off of pressors.  I will resume low dose Coreg.    Supplement potassium and discontinue IV amiodarone.    Nonischemic cardiomyopathy:  Previous  class I symptoms.. She has not had significant LVH  or evidence of other etiology.   Plan cardiac cath.  Consider MRI.  Slowly begin to resume GDMT after cath.     AKI:  Creat is still up slightly.    Hold off on Entresto  For questions or updates, please contact [facility] Please consult [email or web address] for contact info under Cardiology/STEMI.   Signed, [name], MD  12/09/2022, 7:52 AM

## 2022-12-09 NOTE — Interval H&P Note (Signed)
History and Physical Interval Note:  12/09/2022 5:24 PM  Anjelika Ausburn  has presented today for surgery, with the diagnosis of NSTEMI.  The various methods of treatment have been discussed with the patient and family. After consideration of risks, benefits and other options for treatment, the patient has consented to  Procedure(s): RIGHT/LEFT HEART CATH AND CORONARY ANGIOGRAPHY (N/A) and possible coronary intervention as a surgical intervention.  The patient's history has been reviewed, patient examined, no change in status, stable for surgery.  I have reviewed the patient's chart and labs.  Questions were answered to the patient's satisfaction.    Cath Lab Visit (complete for each Cath Lab visit)  Clinical Evaluation Leading to the Procedure:   ACS: Yes.    Non-ACS:    Anginal Classification: CCS IV  Anti-ischemic medical therapy: Minimal Therapy (1 class of medications)  Non-Invasive Test Results: No non-invasive testing performed  Prior CABG: No previous CABG   Yates Decamp

## 2022-12-10 ENCOUNTER — Encounter (HOSPITAL_COMMUNITY): Payer: Self-pay | Admitting: Cardiology

## 2022-12-10 ENCOUNTER — Inpatient Hospital Stay (HOSPITAL_COMMUNITY): Payer: Medicare Other

## 2022-12-10 DIAGNOSIS — S81802A Unspecified open wound, left lower leg, initial encounter: Secondary | ICD-10-CM

## 2022-12-10 DIAGNOSIS — I469 Cardiac arrest, cause unspecified: Secondary | ICD-10-CM | POA: Diagnosis not present

## 2022-12-10 DIAGNOSIS — I4901 Ventricular fibrillation: Secondary | ICD-10-CM

## 2022-12-10 DIAGNOSIS — J9601 Acute respiratory failure with hypoxia: Secondary | ICD-10-CM

## 2022-12-10 LAB — GLUCOSE, CAPILLARY
Glucose-Capillary: 108 mg/dL — ABNORMAL HIGH (ref 70–99)
Glucose-Capillary: 109 mg/dL — ABNORMAL HIGH (ref 70–99)
Glucose-Capillary: 112 mg/dL — ABNORMAL HIGH (ref 70–99)
Glucose-Capillary: 151 mg/dL — ABNORMAL HIGH (ref 70–99)
Glucose-Capillary: 170 mg/dL — ABNORMAL HIGH (ref 70–99)

## 2022-12-10 LAB — BASIC METABOLIC PANEL
Anion gap: 9 (ref 5–15)
BUN: 20 mg/dL (ref 6–20)
CO2: 23 mmol/L (ref 22–32)
Calcium: 8.3 mg/dL — ABNORMAL LOW (ref 8.9–10.3)
Chloride: 104 mmol/L (ref 98–111)
Creatinine, Ser: 1.32 mg/dL — ABNORMAL HIGH (ref 0.44–1.00)
GFR, Estimated: 47 mL/min — ABNORMAL LOW (ref >60)
Glucose, Bld: 108 mg/dL — ABNORMAL HIGH (ref 70–99)
Potassium: 4 mmol/L (ref 3.5–5.1)
Sodium: 136 mmol/L (ref 135–145)

## 2022-12-10 LAB — CBC WITH DIFFERENTIAL/PLATELET
Abs Immature Granulocytes: 0.04 10*3/uL (ref 0.00–0.07)
Basophils Absolute: 0.1 10*3/uL (ref 0.0–0.1)
Basophils Relative: 1 %
Eosinophils Absolute: 0.2 10*3/uL (ref 0.0–0.5)
Eosinophils Relative: 3 %
HCT: 39.1 % (ref 36.0–46.0)
Hemoglobin: 12.8 g/dL (ref 12.0–15.0)
Immature Granulocytes: 1 %
Lymphocytes Relative: 14 %
Lymphs Abs: 1.1 10*3/uL (ref 0.7–4.0)
MCH: 28.2 pg (ref 26.0–34.0)
MCHC: 32.7 g/dL (ref 30.0–36.0)
MCV: 86.1 fL (ref 80.0–100.0)
Monocytes Absolute: 0.9 10*3/uL (ref 0.1–1.0)
Monocytes Relative: 12 %
Neutro Abs: 5.3 10*3/uL (ref 1.7–7.7)
Neutrophils Relative %: 69 %
Platelets: 151 10*3/uL (ref 150–400)
RBC: 4.54 MIL/uL (ref 3.87–5.11)
RDW: 14.8 % (ref 11.5–15.5)
WBC: 7.6 10*3/uL (ref 4.0–10.5)
nRBC: 0 % (ref 0.0–0.2)

## 2022-12-10 LAB — HEPATIC FUNCTION PANEL
ALT: 45 U/L — ABNORMAL HIGH (ref 0–44)
AST: 22 U/L (ref 15–41)
Albumin: 2.4 g/dL — ABNORMAL LOW (ref 3.5–5.0)
Alkaline Phosphatase: 31 U/L — ABNORMAL LOW (ref 38–126)
Bilirubin, Direct: 0.2 mg/dL (ref 0.0–0.2)
Indirect Bilirubin: 0.6 mg/dL (ref 0.3–0.9)
Total Bilirubin: 0.8 mg/dL (ref 0.3–1.2)
Total Protein: 5.5 g/dL — ABNORMAL LOW (ref 6.5–8.1)

## 2022-12-10 LAB — MAGNESIUM: Magnesium: 2.4 mg/dL (ref 1.7–2.4)

## 2022-12-10 LAB — PHOSPHORUS: Phosphorus: 3.2 mg/dL (ref 2.5–4.6)

## 2022-12-10 MED ORDER — DAPAGLIFLOZIN PROPANEDIOL 10 MG PO TABS
10.0000 mg | ORAL_TABLET | Freq: Every day | ORAL | Status: DC
  Administered 2022-12-10 – 2022-12-17 (×8): 10 mg via ORAL
  Filled 2022-12-10 (×8): qty 1

## 2022-12-10 MED ORDER — ALPRAZOLAM 0.5 MG PO TABS
0.5000 mg | ORAL_TABLET | Freq: Two times a day (BID) | ORAL | Status: DC | PRN
  Administered 2022-12-10 – 2022-12-15 (×4): 0.5 mg via ORAL
  Filled 2022-12-10 (×4): qty 1

## 2022-12-10 MED ORDER — RACEPINEPHRINE HCL 2.25 % IN NEBU: 0.5000 mL | INHALATION_SOLUTION | RESPIRATORY_TRACT | Status: DC | PRN

## 2022-12-10 MED ORDER — SACUBITRIL-VALSARTAN 24-26 MG PO TABS
1.0000 | ORAL_TABLET | Freq: Two times a day (BID) | ORAL | Status: AC
  Administered 2022-12-10 – 2022-12-11 (×4): 1 via ORAL
  Filled 2022-12-10 (×5): qty 1

## 2022-12-10 MED ORDER — INSULIN ASPART 100 UNIT/ML IJ SOLN
0.0000 [IU] | Freq: Three times a day (TID) | INTRAMUSCULAR | Status: DC
  Administered 2022-12-11: 2 [IU] via SUBCUTANEOUS
  Administered 2022-12-11 – 2022-12-13 (×5): 3 [IU] via SUBCUTANEOUS
  Administered 2022-12-15: 4 [IU] via SUBCUTANEOUS
  Administered 2022-12-15 – 2022-12-16 (×4): 3 [IU] via SUBCUTANEOUS
  Administered 2022-12-17: 4 [IU] via SUBCUTANEOUS
  Administered 2022-12-17: 3 [IU] via SUBCUTANEOUS

## 2022-12-10 MED FILL — Verapamil HCl IV Soln 2.5 MG/ML: INTRAVENOUS | Qty: 2 | Status: AC

## 2022-12-10 NOTE — Progress Notes (Signed)
   12/10/22 1526  Pain Assessment  Pain Scale 0-10  Pain Score 9  Pain Type Acute pain  Pain Location Chest  Pain Orientation Mid  Pain Descriptors / Indicators Aching  Pain Frequency Intermittent  Pain Onset On-going  Pain Intervention(s) Medication (See eMAR)    CIR admission relayed to RN that patient would like pain medicine from coughing. RN rounded and offered patient IV fentanyl for a 9/10 mid sternal chest pain from coughing spells. Patient relayed to RN "I heard bad things about that drug". RN educated patient on drug but patient would like MD to review pain meds to see if there is any other pain med to offer. RN administered PO oxy per request and paged MD per patient's request.

## 2022-12-10 NOTE — Progress Notes (Signed)
Inpatient Rehab Admissions Coordinator:  ? ?Per therapy recommendations,  patient was screened for CIR candidacy by Devaney Segers, MS, CCC-SLP. At this time, Pt. Appears to be a a potential candidate for CIR. I will place   order for rehab consult per protocol for full assessment. Please contact me any with questions. ? ?Trine Fread, MS, CCC-SLP ?Rehab Admissions Coordinator  ?[phone] (celll) ?[phone] (office) ? ?

## 2022-12-10 NOTE — Progress Notes (Signed)
Inpatient Rehab Coordinator Note:  I met with pt and her spouse, [name],  at bedside to discuss CIR recommendations and goals/expectations of CIR stay.  We reviewed 3 hrs/day of therapy, physician follow up, and average length of stay 2 weeks (dependent upon progress) with goals of supervision.  Pt asks appropriate questions, and is agreeable to rehab.  We reviewed Medicare/Medicaid benefits.  Note plans for EP intervention with likely ICD after line holiday.  Pt does demo some STM deficits throughout assessment.  We will follow for potential admit after completion of acute course and pending progress with therapies.   [name], PT, DPT Admissions Coordinator [phone] 12/10/22  3:22 PM

## 2022-12-10 NOTE — Progress Notes (Signed)
NAME:  [name], MRN:  [id number], DOB:  09/26/1965, LOS: 5 ADMISSION DATE:  12/05/2022, CONSULTATION DATE: 10/10 REFERRING MD: Dr. Particia Nearing, CHIEF COMPLAINT: Cardiac arrest  History of Present Illness:  57 year old female with past medical history as below, which is significant for hypertension, hyperlipidemia, CHF with EF 30 to 35% on GDMT, cardiac cath in 2018 showed no obstruction, DM, and morbid obesity who presented to [facility] emergency department on 10/10 after suffering cardiac arrest.  Patient was at home with her husband when she suffered a witnessed cardiac arrest.  She did have bystander CPR.  Upon EMS arrival she was found to be in VT/VF and ACLS was administered over over several arrest periods totaling up to 40 minutes.  Each arrest was VT/VF.  The patient received 6 defibrillations, 9 epi pushes, IV magnesium, and 2 doses of amiodarone.  Upon arrival to the emergency department she was intubated for airway protection.  She was evaluated by cardiology in the ED and PCCM was asked to evaluate for admission.  Pertinent  Medical History   Past Medical History:  Diagnosis Date   ADD (attention deficit disorder)    Anxiety    Arthritis    both knees right worse than left   Carpal tunnel syndrome of right wrist    Depression    Essential hypertension, benign    Gallstones    History of smoking 06/22/2016   Hyperlipidemia associated with type 2 diabetes mellitus (HCC), on Zocor 04/04/2011   Hypertension associated with diabetes (HCC) 06/03/2008   Migraines    Morbid obesity (HCC)    Morbid obesity with BMI of 50.0-59.9, adult (HCC) 07/12/2006   NICM (nonischemic cardiomyopathy) (HCC) 07/12/2006   01/16/18 ECHO:    - Procedure narrative: Transthoracic echocardiography. Image   quality was suboptimal. The study was technically difficult.   Intravenous contrast (Definity) was administered. - Left ventricle: The cavity size was moderately dilated. Wall   thickness was increased in a  pattern of mild LVH. Systolic   function was moderately to severely reduced. The estimated   ejection fraction w   Normal coronary arteries 04/17/2016   OSA on CPAP 10/28/2014   Spinal stenosis    back pain   Spondylosis without myelopathy or radiculopathy, lumbar region 12/04/2017   Type 2 diabetes mellitus with hyperglycemia [facility])    Past Surgical History:  Procedure Laterality Date   CHOLECYSTECTOMY     DILATATION & CURETTAGE/HYSTEROSCOPY WITH MYOSURE N/A 10/31/2017   Procedure: DILATATION & CURETTAGE/HYSTEROSCOPY WITH MYOSURE;  Surgeon: Romualdo Bolk, MD;  Location: [facility] ORS;  Service: Gynecology;  Laterality: N/A;   HYSTEROSCOPY WITH D & C N/A 05/07/2022   Procedure: DILATATION AND CURETTAGE /HYSTEROSCOPY;  Surgeon: [name], MD;  Location: [facility] OR;  Service: Gynecology;  Laterality: N/A;   RIGHT/LEFT HEART CATH AND CORONARY ANGIOGRAPHY N/A 04/11/2016   Procedure: Right/Left Heart Cath and Coronary Angiography;  Surgeon: Kathleene Hazel, MD;  Location: [facility] INVASIVE CV LAB;  Service: Cardiovascular;  Laterality: N/A;   sonogram for blood clots     no blockages   Significant Hospital Events: Including procedures, antibiotic start and stop dates in addition to other pertinent events   10/10 admitted s/p VF arrest 10/11.  Patient woke up and followed commands.  Tolerated SBT but remained vasopressor dependent.  Central line placed. 10/14 remains on Amio, for cath today  Interim History / Subjective:   Cath yesterday clean Off iv drips Some pain w/ coughing  Objective   Blood  pressure (!) 152/79, pulse 85, temperature 98.5 F (36.9 C), temperature source Oral, resp. rate 13, weight (!) 137.5 kg, SpO2 97%. CVP:  [0 mmHg-10 mmHg] 7 mmHg      Intake/Output Summary (Last 24 hours) at 12/10/2022 7824 Last data filed at 12/10/2022 0700 Gross per 24 hour  Intake 585.21 ml  Output 1155 ml  Net -569.79 ml   Filed Weights   12/08/22 0706 12/09/22 0500 12/10/22 0500   Weight: (!) 142.1 kg (!) 138.2 kg (!) 137.5 kg   Examination: General: NAD HEENT: MM pink/moist; Kincaid in place Neuro: Aox3; MAE CV: s1s2, RRR, no m/r/g PULM:  dim clear BS bilaterally; [place] 4L GI: soft, bsx4 active  Extremities: warm/dry, [name] wrapped Skin: no rashes or lesions   Ancillary test personally reviewed:  Na 137 ( 132) Mag 1.9, orders for repletion placed Creatinine has leveled at  further to 1.57 from 1.73 Mildly elevated transaminases-improving Troponin down trending>> not evaluated 10/14 Leukocytosis persists at 9.9 from 13.2. AST 29 from 41, ALT 63 from 83 Alk Phos 33 from 32 Lactate cleared 10/13 to 1.3 Negative UDS CT head negative. GI pathogen panel negative.  Blood cultures negative. Repeat echocardiogram 10/13  shows ejection fraction approximately 30%.   Overall appearance is unchanged from study from a year ago. Assessment & Plan:   VF cardiac arrest on background of HFrEF with known EF 35%. Type II myocardial injury pattern secondary to cardiac arrest Distributive postcardiac arrest shock - now resolved.  History of hypertension History of dyslipidemia. Plan: -10/14 left heart cath clean -amio stopped yesterday -tele monitoring -cards following; appreciate recs -cont carvedilol and statin -trend and replete electrolytes when able   Skin necrosis from possible prior extravasation of pressors from I/O removed 2 days ago. Blanching was not apparent then nor did appear to progress until now.  - Spoke with [facility] who don't feel compartment syndrome is likely given lack of calf pain and presence of pulses. They recommend wound care, allow injury to demarcate itself and eventual debridement and grafting if necessary. By plastic surgery.  Plan: -ortho following; appreciate recs -woc following  Acute hypoxic respiratory failure.  Possible community-acquired pneumonia. History of OSA on CPAP Plan: -wean Aspers for sats >92% -pulm toiletry -OOB, PT,  OT -rocephin x 7days -cpap at bedtime  AKI Plan: -improving -Trend BMP / urinary output -Replace electrolytes as indicated -Avoid nephrotoxic agents, ensure adequate renal perfusion  History of type 2 diabetes Plan: -ssi and cbg monitoring  Morbid obesity Plan: -weight loss counseling   Best Practice (right click and "Reselect all SmartList Selections" daily)   Diet/type: diet regular DVT prophylaxis: prophylactic heparin   GI prophylaxis: H2B Lines: Central line; work on getting other iv's and consider removing tomorrow Foley:  Yes, and it is no longer needed Code Status:  full code Last date of multidisciplinary goals of care discussion [ ]  10/15 patient and husband at bedside updated  CRITICAL CARE Performed by: Dorothe Pea [facility] 12/10/2022, 7:50 AM  Please see [email or web address] for pager details.  From 7A-7P if no response, please call [phone]. After hours, please call ELink [phone].

## 2022-12-10 NOTE — Consult Note (Addendum)
Cardiology Consultation   Patient ID: [name] MRN: [id number]; DOB: 1965-07-14  Admit date: 12/05/2022 Date of Consult: 12/10/2022  PCP:  [name], MD   [facility] Providers Cardiologist:  [name], MD   {    Patient Profile:   [name] is a 57 y.o. female with a hx of NICM, chronic CHF (EF waxes/wanes 30's-50's), depression (that is described as severe), HTN, obesity, DM, OSA who is being seen 12/10/2022 for the evaluation of cardiac arrest, evaluate for ICD at the request of Dr. [name].  History of Present Illness:   [name] last saw Dr. [name] out patient in Dec 2023, , denied any cardiac symptoms, discussed on/off significant issues with depression on again/off again with her meds, felt that she needed in patient management of her current bout of severe depression and advised to the ER for this reason.  She was admitted 12/05/22 with OOH cardiac arrest. In review of H&P:husband attempted CPR. Her rhythm when they got their was Vfib. EMS administered 6 shocks, numerous rounds of epi, multiple boluses of amiodarone, as well as magnesium. She eventually achieved ROSC and was intubated in the ED required levo for shock  K+ 3.7 BUN/Creat 21/1.10 Lactic acid 13.7 WBC 15.8  Mag 2.7  10/12: waking up and following commands > extubated LVEF is 30-35% Trivial MR 10/13:  cath with normal coronaries LV: 119/7, EDP 17 mmHg.  Ao 110/78, mean 92 mmHg.  No pressure gradient across the aortic valve. RA: 15/13, mean 8 mmHg. RV 47/6, EDP 17 mmHg. PA 46/21, mean 31 mmHg.  PA saturation 66%. PW 18/19, mean 19 mmHg.  AO saturation 92%. CO 6.69, CI 2.83, preserved and normal.  QP/QS 1.00  LABS today K+ 4.0 BUN/Creat 20/1.32 LFTs trending down AST 22 ALT 45 WBC 7.6 H/H 12/39 Plts 151  Out pt meds  Coreg/entresto, lasix, K+  EMS  reviewed: Found unresponsive, no pulse/BP, FD attending pt CPR AED shock Intubated IO access Manual  defibrillation 360J Epi Defibrillated 360 x2 Amio 300mg  360Joules Epi CPR Mag 360Joules Epi x3 360Joules Epi CPR  Epi Rhythm > AFib by report  She denies any CP, palpitations SOB. Details around her event hazy. She recalls going into  the kitchen to talk with her husband, sat in the chair recalls having a sense of feeling ringing in her ears, perhaps a sense of floating or separation from her body.  Nothing else until days later here. Her husband reports that in the last several days she has reported feeling "bad" She clarifies, if have depression bad and physical bad, of late perhaps both. She admitted t severe depression, but denies any overdose of any kind, no extra medications, nothing intentional Her husband says she is not great at taking her meds, in fact misses doses fairly regular, especially the mid-day dose of 3 time dosing meds.  Past Medical History:  Diagnosis Date   ADD (attention deficit disorder)    Anxiety    Arthritis    both knees right worse than left   Carpal tunnel syndrome of right wrist    Depression    Essential hypertension, benign    Gallstones    History of smoking 06/22/2016   Hyperlipidemia associated with type 2 diabetes mellitus (HCC), on Zocor 04/04/2011   Hypertension associated with diabetes (HCC) 06/03/2008   Migraines    Morbid obesity (HCC)    Morbid obesity with BMI of 50.0-59.9, adult (HCC) 07/12/2006   NICM (nonischemic cardiomyopathy) (HCC)  07/12/2006   01/16/18 ECHO:    - Procedure narrative: Transthoracic echocardiography. Image   quality was suboptimal. The study was technically difficult.   Intravenous contrast (Definity) was administered. - Left ventricle: The cavity size was moderately dilated. Wall   thickness was increased in a pattern of mild LVH. Systolic   function was moderately to severely reduced. The estimated   ejection fraction w   Normal coronary arteries 04/17/2016   OSA on CPAP 10/28/2014   Spinal stenosis    back pain    Spondylosis without myelopathy or radiculopathy, lumbar region 12/04/2017   Type 2 diabetes mellitus with hyperglycemia [facility])     Past Surgical History:  Procedure Laterality Date   CHOLECYSTECTOMY     DILATATION & CURETTAGE/HYSTEROSCOPY WITH MYOSURE N/A 10/31/2017   Procedure: DILATATION & CURETTAGE/HYSTEROSCOPY WITH MYOSURE;  Surgeon: Romualdo Bolk, MD;  Location: [facility] ORS;  Service: Gynecology;  Laterality: N/A;   HYSTEROSCOPY WITH D & C N/A 05/07/2022   Procedure: DILATATION AND CURETTAGE /HYSTEROSCOPY;  Surgeon: [name], MD;  Location: [facility] OR;  Service: Gynecology;  Laterality: N/A;   RIGHT/LEFT HEART CATH AND CORONARY ANGIOGRAPHY N/A 04/11/2016   Procedure: Right/Left Heart Cath and Coronary Angiography;  Surgeon: Kathleene Hazel, MD;  Location[facility] INVAS[facility] CV [facility];  Service: Cardiovascular;  Laterality: N/A;   RIGHT/LEFT HEART CATH AND CORONARY ANGIOGRAPHY N/A 12/09/2022   Procedure: RIGHT/LEFT HEART CATH AND CORONARY ANGIOGRAPHY;  Surgeon: Yates Decamp, MD;  Location: [facility] INVASIVE CV LAB;  Service: Cardiovascular;  Laterality: N/A;   sonogram for blood clots     no blockages     Home Medications:  Prior to Admission medications   Medication Sig Start Date End Date Taking? Authorizing Provider  cariprazine (VRAYLAR) 3 MG capsule Take 1 capsule (3 mg total) by mouth daily. Patient taking differently: Take 1.5 mg by mouth daily. 12/05/22  Yes Nwoko, Uchenna E, PA  carvedilol (COREG) 25 MG tablet TAKE 1 TABLET (25 MG TOTAL) BY MOUTH TWICE A DAY WITH MEALS Patient taking differently: Take 25 mg by mouth 2 (two) times daily with a meal. 06/10/22  Yes [name], MD  Dapagliflozin Pro-metFORMIN ER (XIGDUO XR) 11-998 MG TB24 Take 1 tablet by mouth daily. 10/01/22  Yes [name], MD  escitalopram (LEXAPRO) 10 MG tablet Take 1 tablet (10 mg total) by mouth daily. 12/05/22 12/05/23 Yes Nwoko, Stephens Shire E, PA  furosemide (LASIX) 80 MG tablet TAKE 1 TABLET BY MOUTH  EVERY DAY 11/04/22  Yes Azalee Course, PA  gabapentin (NEURONTIN) 100 MG capsule Take 1 capsule (100 mg total) by mouth 3 (three) times daily. 12/05/22  Yes Nwoko, Uchenna E, PA  potassium chloride SA (KLOR-CON M20) 20 MEQ tablet TAKE 2 TABLETS BY MOUTH DAILY 06/10/22  Yes Hochrein, Fayrene Fearing, MD  sacubitril-valsartan (ENTRESTO) 97-103 MG Take 1 tablet by mouth 2 (two) times daily. 06/10/22  Yes [name], MD  simvastatin (ZOCOR) 20 MG tablet TAKE 1 TABLET BY MOUTH EVERY DAY 10/05/22  Yes [name], MD  Blood Glucose Monitoring Suppl DEVI 1 each by Does not apply route in the morning, at noon, and at bedtime. May substitute to any manufacturer covered by patient's insurance. 11/15/22   Elenore Paddy, NP  Glucose Blood (BLOOD GLUCOSE TEST STRIPS) STRP 1 each by In Vitro route in the morning, at noon, and at bedtime. May substitute to any manufacturer covered by patient's insurance. 11/15/22 12/20/23  Elenore Paddy, NP  hydrOXYzine (ATARAX) 25 MG tablet Take 25-50  mg by mouth daily as needed for anxiety. Patient not taking: Reported on 12/05/2022 11/12/22   [provider]  Lancet Device MISC 1 each by Does not apply route in the morning, at noon, and at bedtime. May substitute to any manufacturer covered by patient's insurance. 11/15/22 12/15/22  Elenore Paddy, NP  Lancets Misc. MISC 1 each by Does not apply route in the morning, at noon, and at bedtime. May substitute to any manufacturer covered by patient's insurance. 11/15/22 12/15/22  Elenore Paddy, NP  methylphenidate (RITALIN) 5 MG tablet Take 1 tablet (5 mg total) by mouth 2 (two) times daily. Patient not taking: Reported on 12/05/2022 12/05/22   Meta Hatchet, PA  Misc. Devices (FOAM CUSHION THERAPEUTIC RING) MISC Use while sitting 04/23/22   Milas Hock, MD  NON FORMULARY Pt uses cpap nightly    [provider]  tirzepatide [facility]) 2.5 MG/0.5ML Pen INJECT 2.5 MG SUBCUTANEOUSLY WEEKLY Patient not taking: Reported on  12/05/2022 10/14/22   [name], MD  tiZANidine (ZANAFLEX) 2 MG tablet TAKE 1 TABLET BY MOUTH EVERY 8 HOURS AS NEEDED FOR MUSCLE SPASMS. Patient not taking: Reported on 12/05/2022 11/09/22   [name], MD    Inpatient Medications: Scheduled Meds:  atorvastatin  80 mg Oral Daily   carvedilol  6.25 mg Oral BID WC   Chlorhexidine Gluconate Cloth  6 each Topical Daily   dapagliflozin propanediol  10 mg Oral Daily   famotidine  20 mg Oral BID   heparin injection (subcutaneous)  5,000 Units Subcutaneous Q8H   insulin aspart  0-20 Units Subcutaneous Q4H   mouth rinse  15 mL Mouth Rinse 4 times per day   sacubitril-valsartan  1 tablet Oral BID   sodium chloride flush  10 mL Intravenous Q12H   sodium chloride flush  3 mL Intravenous Q12H   Continuous Infusions:  cefTRIAXone (ROCEPHIN)  IV 2 g (12/10/22 1610)   norepinephrine (LEVOPHED) Adult infusion Stopped (12/07/22 1747)   PRN Meds: acetaminophen **OR** acetaminophen (TYLENOL) oral liquid 160 mg/5 mL **OR** acetaminophen, ALPRAZolam, fentaNYL (SUBLIMAZE) injection, hydrOXYzine, mouth rinse, oxyCODONE, sodium chloride flush  Allergies:    Allergies  Allergen Reactions   Fluoxetine Other (See Comments)    More depressed   Cymbalta [Duloxetine Hcl] Other (See Comments)    depressed   Pork-Derived Products    Penicillins Rash    Has patient had a PCN reaction causing immediate rash, facial/tongue/throat swelling, SOB or lightheadedness with hypotension: YES Has patient had a PCN reaction causing severe rash involving mucus membranes or skin necrosis: NO Has patient had a PCN reaction that required hospitalization: YES Has patient had a PCN reaction occurring within the last 10 years: NO If all of the above answers are "NO", then may proceed with Cephalosporin use.     Social History:   Social History   Socioeconomic History   Marital status: Married    Spouse name: Not on file   Number of children: Not on file    Years of education: Not on file   Highest education level: Not on file  Occupational History   Not on file  Tobacco Use   Smoking status: Former    Current packs/day: 0.00    Types: Cigarettes    Quit date: 02/25/1997    Years since quitting: 25.8   Smokeless tobacco: Never   Tobacco comments:    Married, lives with spouse (when he is not traveling) and son  Vaping Use  Vaping status: Never Used  Substance and Sexual Activity   Alcohol use: No   Drug use: No   Sexual activity: Not Currently    Partners: Male    Birth control/protection: None  Other Topics Concern   Not on file  Social History Narrative   Not on file   Social Determinants of Health   Financial Resource Strain: High Risk (03/01/2022)   Overall Financial Resource Strain (CARDIA)    Difficulty of Paying Living Expenses: Hard  Food Insecurity: No Food Insecurity (12/06/2022)   Hunger Vital Sign    Worried About Running Out of Food in the Last Year: Never true    Ran Out of Food in the Last Year: Never true  Transportation Needs: No Transportation Needs (12/06/2022)   PRAPARE - Administrator, Civil Service (Medical): No    Lack of Transportation (Non-Medical): No  Physical Activity: Inactive (11/12/2022)   Exercise Vital Sign    Days of Exercise per Week: 0 days    Minutes of Exercise per Session: 0 min  Stress: Stress Concern Present (11/12/2022)   [employer] of Occupational Health - Occupational Stress Questionnaire    Feeling of Stress : Very much  Social Connections: Socially Isolated (08/08/2021)   Social Connection and Isolation Panel [NHANES]    Frequency of Communication with Friends and Family: Once a week    Frequency of Social Gatherings with Friends and Family: Once a week    Attends Religious Services: Never    Database administrator or Organizations: No    Attends Banker Meetings: Never    Marital Status: Married  Catering manager Violence: Not At Risk  (08/08/2021)   Humiliation, Afraid, Rape, and Kick questionnaire    Fear of Current or Ex-Partner: No    Emotionally Abused: No    Physically Abused: No    Sexually Abused: No    Family History:   Family History  Problem Relation Age of Onset   Depression Mother    Anxiety disorder Mother    Diabetes Mother    Hypertension Mother    ADD / ADHD Father    Diabetes Father    Hypertension Father    Colitis Father    Hypertension Other    Diabetes Other    Colitis Other    Alcohol abuse Other      ROS:  Please see the history of present illness.  All other ROS reviewed and negative.     Physical Exam/Data:   Vitals:   12/10/22 0600 12/10/22 0700 12/10/22 0800 12/10/22 0900  BP: (!) 157/82 (!) 152/79 (!) 156/91 (!) 145/82  Pulse: 84 85 87 89  Resp: 18 13 20 14   Temp:      TempSrc:      SpO2: 91% 97% 98% 97%  Weight:        Intake/Output Summary (Last 24 hours) at 12/10/2022 1203 Last data filed at 12/10/2022 0800 Gross per 24 hour  Intake 351.33 ml  Output 935 ml  Net -583.67 ml      12/10/2022    5:00 AM 12/09/2022    5:00 AM 12/08/2022    7:06 AM  Last 3 Weights  Weight (lbs) 303 lb 2.1 oz 304 lb 10.8 oz 313 lb 4.4 oz  Weight (kg) 137.5 kg 138.2 kg 142.1 kg     Body mass index is 52.03 kg/m.  General:  Well nourished, well developed, in no acute distress HEENT: normal Neck: no JVD Vascular:  No carotid bruits; Distal pulses 2+ bilaterally Cardiac:  RRR; no murmurs, gallops or rubs Lungs:  CTA b/l no wheezing, rhonchi or rales  Abd: soft, nontender, obese  Ext: no pitting edema Musculoskeletal:  No deformities Skin: L lower leg with very large area of skin sloughing  Neuro:  no focal abnormalities noted Psych:  Normal affect   EKG:  The EKG was personally reviewed and demonstrates:    SR 72bpm, IVCD, LAD, no ST changes   Old: 02/25/22: ST 117bpnm IVCD/LAD  Telemetry:  Telemetry was personally reviewed and demonstrates:   SR 80's-90's  Relevant  CV Studies:  01/07/23: TTE 1. Left ventricular ejection fraction, by estimation, is 30 to 35%. The  left ventricle has moderately decreased function. The left ventricle  demonstrates global hypokinesis. There is mild left ventricular  hypertrophy. Indeterminate diastolic filling due   to E-A fusion.   2. Right ventricular systolic function is normal. The right ventricular  size is normal.   3. The mitral valve is normal in structure. Trivial mitral valve  regurgitation.   4. The aortic valve is tricuspid. Aortic valve regurgitation is not  visualized.   5. The inferior vena cava IVC not well visualized.   Comparison(s): No significant change from prior study.   07/10/21: TTE 1. Left ventricular ejection fraction, by estimation, is 30 to 35%. Left  ventricular ejection fraction by 3D volume is 32 %. The left ventricle has  moderately decreased function. The left ventricle demonstrates global  hypokinesis. The left ventricular  internal cavity size was moderately dilated. Left ventricular diastolic  parameters are consistent with Grade I diastolic dysfunction (impaired  relaxation).   2. Right ventricular systolic function is normal. The right ventricular  size is normal. Tricuspid regurgitation signal is inadequate for assessing  PA pressure.   3. The mitral valve is grossly normal. Trivial mitral valve  regurgitation.   4. The aortic valve is tricuspid. Aortic valve regurgitation is not  visualized.   5. The inferior vena cava is normal in size with <50% respiratory  variability, suggesting right atrial pressure of 8 mmHg.   Comparison(s): 12/30/18 EF 50-55%.   Laboratory Data:  High Sensitivity Troponin:   Recent Labs  Lab 12/06/22 0009 12/06/22 0317 12/06/22 1044 12/06/22 1321 12/06/22 1601  TROPONINIHS 2,849* 3,234* 1,025* 809* 636*     Chemistry Recent Labs  Lab 12/08/22 0509 12/09/22 0311 12/09/22 1737 12/09/22 1740 12/10/22 0227  NA 132* 137 137 137 136   K 3.6 3.8 4.1 4.2 4.0  CL 102 103  --   --  104  CO2 21* 23  --   --  23  GLUCOSE 121* 119*  --   --  108*  BUN 29* 22*  --   --  20  CREATININE 1.73* 1.57*  --   --  1.32*  CALCIUM 7.5* 8.3*  --   --  8.3*  MG 1.8 1.9  --   --  2.4  GFRNONAA 34* 38*  --   --  47*  ANIONGAP 9 11  --   --  9    Recent Labs  Lab 12/08/22 0509 12/09/22 0311 12/10/22 0227  PROT 5.7* 5.8* 5.5*  ALBUMIN 2.5* 2.5* 2.4*  AST 41 29 22  ALT 83* 63* 45*  ALKPHOS 32* 33* 31*  BILITOT 0.8 0.9 0.8   Lipids  Recent Labs  Lab 12/06/22 0320  TRIG 208*    Hematology Recent Labs  Lab 12/08/22 0509 12/09/22 0311 12/09/22  1737 12/09/22 1740 12/10/22 0227  WBC 13.2* 9.9  --   --  7.6  RBC 4.76 4.77  --   --  4.54  HGB 13.2 13.3 12.9 13.3 12.8  HCT 40.5 41.2 38.0 39.0 39.1  MCV 85.1 86.4  --   --  86.1  MCH 27.7 27.9  --   --  28.2  MCHC 32.6 32.3  --   --  32.7  RDW 14.9 14.8  --   --  14.8  PLT 154 159  --   --  151   Thyroid No results for input(s): "TSH", "FREET4" in the last 168 hours.  BNPNo results for input(s): "BNP", "PROBNP" in the last 168 hours.  DDimer No results for input(s): "DDIMER" in the last 168 hours.   Radiology/Studies:   DG CHEST PORT 1 VIEW Result Date: 12/06/2022 CLINICAL DATA:  Central line placement EXAM: PORTABLE CHEST 1 VIEW COMPARISON:  12/05/2022 radiographs FINDINGS: The patient is rotated to the left on today's radiograph, reducing diagnostic sensitivity and specificity. Endotracheal tube tip 3.2 cm above the carina. A left central line is observed with tip projecting over the right atrium. Low lung volumes are present, causing crowding of the pulmonary vasculature. No pneumothorax observed. Suspected underlying mild enlargement of the cardiopericardial silhouette IMPRESSION: 1. Left central line tip projects over the right atrium. 2. Endotracheal tube tip 3.2 cm above the carina. 3. Low lung volumes. 4. Suspected underlying mild enlargement of the cardiopericardial  silhouette. Electronically Signed   By: Gaylyn Rong M.D.   On: 12/06/2022 17:32     Assessment and Plan:   OOH cardiac arrest NICM The pt reports this goes back 20 years and at least initially a post partum CM  She Romero Letizia need an ICD C.MRI may be helpful to better assess her CM  She has a L chest central line This needs to come out today, needs PIV and central line holiday before we can consider ICD. Perhaps looking towards Thursday  Grisela Mesch get pharmacy to evaluate her meds for any pre-arrhythmia or QT side effects  Risk Assessment/Risk Scores:     For questions or updates, please contact [facility] Please consult [email or web address] for contact info under    Signed, Sheilah Pigeon, PA-C  12/10/2022 12:03 PM  I have seen and examined this patient with Francis Dowse.  Agree with above, note added to reflect my findings.  Patient with a past history as above.  Presented to the hospital with cardiac arrest.  She had CPR.  When EMS arrived, she was in ventricular fibrillation.  She remembers feeling poorly prior to the episode, but cannot ultimately describe the episode.  GEN: Well nourished, well developed, in no acute distress  HEENT: normal  Neck: no JVD, carotid bruits, or masses Cardiac: RRR; no murmurs, rubs, or gallops,no edema  Respiratory:  clear to auscultation bilaterally, normal work of breathing GI: soft, nontender, nondistended, + BS MS: no deformity or atrophy  Skin: warm and dry, skin sloughing, left lower leg Neuro:  Strength and sensation are intact Psych: euthymic mood, full affect   Cardiac arrest: Unclear cause.  She does have a longstanding history of cardiomyopathy.  Calem Cocozza plan for cardiac MRI.  She has a long psychiatric history and is on multiple medications.  Travante Knee ask pharmacy to review these medications.  She Kyrah Schiro need ICD prior to discharge.  She does have a left chest central line.  This Harvin Konicek need to be removed. Chronic systolic heart  failure: No obvious volume overload.  Plan per primary team  Skyelynn Rambeau M. Marcine Gadway MD 12/10/2022 4:19 PM

## 2022-12-10 NOTE — Progress Notes (Signed)
IVT consult placed for PIV assessment. Patient currently has central line for medications + lab work. Per primary RN, central line is to be d/c'd due to pacer placement. Patient was stuck numerous times by phlebotomy before central line was placed. Peripheral access possible, however given the difficulty phlebotomy had obtaining labs, would recommend a PICC line to ensure reliable access + access for lab work. Primary RN notified of findings. Will message provider for PICC order.  Atia Haupt Loyola Mast, RN

## 2022-12-10 NOTE — Progress Notes (Addendum)
Pharmacy note: medication review  57 yo female s/p cardiac arrest. Pharmacy consulted to review medications for potential QT prolonging, arrhytmogenic side effects  -See medication lists below  QTc risk -Lexapro (PTA medication): known torsades risk -furosemide: (PTA medication): conditional risk (increased risk with low potassium/magnesium or when used with other QTc prolonging medications -famotidine (not on PTA): Conditional risk -Hydroxyzine: Conditional risk. She has stopped this medication. Last dispense 11/12/22.  -Tizanidine: possible QTc risk.  She has stopped this medication (last dispense 11/09/22) -Amiodarone: Known risk of tosrades. This medication has been discontinued and she was not on it at home  K= 4.0, mg= 2.4 QTc by tele= 0.44 on 10/11.   Recommendations -Out of all her medications, escitalopram is likely the highest risk for QTc prolongation -Consider discontinuing famotidine (she was not on this at home) Will sign off. Please contact pharmacy with any other needs.  Thank you  Harland German, PharmD Clinical Pharmacist **Pharmacist phone directory can now be found on [email or web address] ([email or web address]).  Listed under [facility].    Medications:  Medications Prior to Admission  Medication Sig Dispense Refill Last Dose   cariprazine (VRAYLAR) 3 MG capsule Take 1 capsule (3 mg total) by mouth daily. (Patient taking differently: Take 1.5 mg by mouth daily.) 30 capsule 1 12/04/2022   carvedilol (COREG) 25 MG tablet TAKE 1 TABLET (25 MG TOTAL) BY MOUTH TWICE A DAY WITH MEALS (Patient taking differently: Take 25 mg by mouth 2 (two) times daily with a meal.) 180 tablet 3 12/04/2022   Dapagliflozin Pro-metFORMIN ER (XIGDUO XR) 11-998 MG TB24 Take 1 tablet by mouth daily. 90 tablet 0 12/04/2022   escitalopram (LEXAPRO) 10 MG tablet Take 1 tablet (10 mg total) by mouth daily. 90 tablet 1 12/04/2022   furosemide (LASIX) 80 MG tablet TAKE 1 TABLET BY MOUTH EVERY DAY 90 tablet 3 12/04/2022    gabapentin (NEURONTIN) 100 MG capsule Take 1 capsule (100 mg total) by mouth 3 (three) times daily. 90 capsule 1 12/04/2022   potassium chloride SA (KLOR-CON M20) 20 MEQ tablet TAKE 2 TABLETS BY MOUTH DAILY 180 tablet 2 12/04/2022   sacubitril-valsartan (ENTRESTO) 97-103 MG Take 1 tablet by mouth 2 (two) times daily. 180 tablet 2 12/04/2022   simvastatin (ZOCOR) 20 MG tablet TAKE 1 TABLET BY MOUTH EVERY DAY 90 tablet 0 12/04/2022   Blood Glucose Monitoring Suppl DEVI 1 each by Does not apply route in the morning, at noon, and at bedtime. May substitute to any manufacturer covered by patient's insurance. 1 each 0    Glucose Blood (BLOOD GLUCOSE TEST STRIPS) STRP 1 each by In Vitro route in the morning, at noon, and at bedtime. May substitute to any manufacturer covered by patient's insurance. 100 strip 11    hydrOXYzine (ATARAX) 25 MG tablet Take 25-50 mg by mouth daily as needed for anxiety. (Patient not taking: Reported on 12/05/2022)   Not Taking   Lancet Device MISC 1 each by Does not apply route in the morning, at noon, and at bedtime. May substitute to any manufacturer covered by patient's insurance. 1 each 0    Lancets Misc. MISC 1 each by Does not apply route in the morning, at noon, and at bedtime. May substitute to any manufacturer covered by patient's insurance. 100 each 11    methylphenidate (RITALIN) 5 MG tablet Take 1 tablet (5 mg total) by mouth 2 (two) times daily. (Patient not taking: Reported on 12/05/2022) 60 tablet 0 Not Taking   Misc. Devices (FOAM CUSHION  THERAPEUTIC RING) MISC Use while sitting 1 each 0    NON FORMULARY Pt uses cpap nightly      tirzepatide (MOUNJARO) 2.5 MG/0.5ML Pen INJECT 2.5 MG SUBCUTANEOUSLY WEEKLY (Patient not taking: Reported on 12/05/2022) 2 mL 0 Not Taking   tiZANidine (ZANAFLEX) 2 MG tablet TAKE 1 TABLET BY MOUTH EVERY 8 HOURS AS NEEDED FOR MUSCLE SPASMS. (Patient not taking: Reported on 12/05/2022) 270 tablet 0 Not Taking   Scheduled:   atorvastatin  80  mg Oral Daily   carvedilol  6.25 mg Oral BID WC   Chlorhexidine Gluconate Cloth  6 each Topical Daily   dapagliflozin propanediol  10 mg Oral Daily   famotidine  20 mg Oral BID   heparin injection (subcutaneous)  5,000 Units Subcutaneous Q8H   insulin aspart  0-20 Units Subcutaneous Q4H   mouth rinse  15 mL Mouth Rinse 4 times per day   sacubitril-valsartan  1 tablet Oral BID   sodium chloride flush  10 mL Intravenous Q12H   sodium chloride flush  3 mL Intravenous Q12H

## 2022-12-10 NOTE — Evaluation (Addendum)
Speech Language Pathology Evaluation Patient Details Name: Tymara Luis MRN: [id number] DOB: 12/27/1965 Today's Date: 12/10/2022 Time: 2130-8657 SLP Time Calculation (min) (ACUTE ONLY): 27 min  Problem List:  Patient Active Problem List   Diagnosis Date Noted   Acute respiratory failure with hypoxia (HCC) 12/10/2022   Wound of left leg 12/10/2022   Ventricular fibrillation (HCC) 12/10/2022   Hyperglycemia 12/09/2022   Cardiac arrest (HCC) 12/09/2022   Acute HFrEF (heart failure with reduced ejection fraction) (HCC) 12/09/2022   Cardiac arrest with ventricular fibrillation (HCC) 12/05/2022   Dysuria 11/15/2022   Onychomycosis 11/15/2022   Near syncope 11/15/2022   Anxiety 10/27/2022   Rosacea, acne 08/13/2022   BRBPR (bright red blood per rectum) 08/13/2022   Vaginal yeast infection 03/06/2022   Primary osteoarthritis of both knees 03/06/2022   Bilateral ovarian cysts 03/06/2022   Major depressive disorder, recurrent episode, severe with anxious distress (HCC) 08/09/2021   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   Need for varicella vaccine 08/09/2021   Urge incontinence 08/01/2021   Tinea corporis 03/14/2021   Hyperlipidemia LDL goal <100 03/01/2021   DDD (degenerative disc disease), lumbar 08/08/2020   Non-seasonal allergic rhinitis due to pollen 07/25/2020   Chronic idiopathic constipation 07/25/2020   Visit for screening mammogram 02/11/2020   Chronic bilateral low back pain without sciatica 02/10/2020   Spondylosis without myelopathy or radiculopathy, lumbar region 12/04/2017   Type 2 diabetes mellitus with hyperglycemia (HCC)    Normal coronary arteries 04/17/2016   OSA on CPAP 10/28/2014   Chronic systolic CHF (congestive heart failure), NYHA class 3 (HCC) 12/06/2013   Hyperlipidemia associated with type 2 diabetes mellitus (HCC), on Zocor 04/04/2011   Hypertension associated with diabetes (HCC) 06/03/2008   Morbid obesity with BMI of  50.0-59.9, adult (HCC) 07/12/2006   NICM (nonischemic cardiomyopathy) (HCC) 07/12/2006   Migraine without status migrainosus, not intractable 07/12/2006   Past Medical History:  Past Medical History:  Diagnosis Date   ADD (attention deficit disorder)    Anxiety    Arthritis    both knees right worse than left   Carpal tunnel syndrome of right wrist    Depression    Essential hypertension, benign    Gallstones    History of smoking 06/22/2016   Hyperlipidemia associated with type 2 diabetes mellitus (HCC), on Zocor 04/04/2011   Hypertension associated with diabetes (HCC) 06/03/2008   Migraines    Morbid obesity (HCC)    Morbid obesity with BMI of 50.0-59.9, adult (HCC) 07/12/2006   NICM (nonischemic cardiomyopathy) (HCC) 07/12/2006   01/16/18 ECHO:    - Procedure narrative: Transthoracic echocardiography. Image   quality was suboptimal. The study was technically difficult.   Intravenous contrast (Definity) was administered. - Left ventricle: The cavity size was moderately dilated. Wall   thickness was increased in a pattern of mild LVH. Systolic   function was moderately to severely reduced. The estimated   ejection fraction w   Normal coronary arteries 04/17/2016   OSA on CPAP 10/28/2014   Spinal stenosis    back pain   Spondylosis without myelopathy or radiculopathy, lumbar region 12/04/2017   Type 2 diabetes mellitus with hyperglycemia [facility])    Past Surgical History:  Past Surgical History:  Procedure Laterality Date   CHOLECYSTECTOMY     DILATATION & CURETTAGE/HYSTEROSCOPY WITH MYOSURE N/A 10/31/2017   Procedure: DILATATION & CURETTAGE/HYSTEROSCOPY WITH MYOSURE;  Surgeon: Romualdo Bolk, MD;  Location: [facility] ORS;  Service: Gynecology;  Laterality: N/A;   HYSTEROSCOPY WITH D &  C N/A 05/07/2022   Procedure: DILATATION AND CURETTAGE /HYSTEROSCOPY;  Surgeon: [name], MD;  Location: [facility] OR;  Service: Gynecology;  Laterality: N/A;   RIGHT/LEFT HEART CATH AND CORONARY ANGIOGRAPHY  N/A 04/11/2016   Procedure: Right/Left Heart Cath and Coronary Angiography;  Surgeon: Kathleene Hazel, MD;  [facility] INVAS[facility] CV LAB;  Service: Cardiovascular;  Laterality: N/A;   RIGHT/LEFT HEART CATH AND CORONARY ANGIOGRAPHY N/A 12/09/2022   Procedure: RIGHT/LEFT HEART CATH AND CORONARY ANGIOGRAPHY;  Surgeon: Yates Decamp, MD;  Location: [facility] INVASIVE CV LAB;  Service: Cardiovascular;  Laterality: N/A;   sonogram for blood clots     no blockages   HPI:  57 year old female admitted 10/10 after suffering cardiac arrest + bystander CPR and eventual ROSC after 40 minutes.  Past medical history is significant for hypertension, hyperlipidemia, CHF with EF 30 to 35% on GDMT, DM, and morbid obesity, arthritis, ADD, OSA on CPAP, SS/back pain, DM2.   Assessment / Plan / Recommendation Clinical Impression  Pt lives with her husband and states she is not working and is on disability. She reports history of significant ADD and is not currently on medication. She feels that her cognition may be slightly worse but was hesitant initially that ST would be able to help her "learn anything I haven't already tried and especially since I'm not on medication" She was unable to tell therapist what events led to her hospital admission or why she was here stating "I don't know". Her impairments on the SLUMS in which she scored an 18/30, were  in memory (recalled 1/5 words), divergent naming, mental calculation, sustained attention. Her language and speech intelligibility were within functional limits. Pt  and husband were agreeable to ST on acute and she would benefit from inpatient rehab > 3 hours.    SLP Assessment  SLP Recommendation/Assessment: Patient needs continued Speech Lanaguage Pathology Services SLP Visit Diagnosis: Cognitive communication deficit (R41.841)    Recommendations for follow up therapy are one component of a multi-disciplinary discharge planning process, led by the attending physician.   Recommendations may be updated based on patient status, additional functional criteria and insurance authorization.    Follow Up Recommendations  Acute inpatient rehab (3hours/day)    Assistance Recommended at Discharge     Functional Status Assessment Patient has had a recent decline in their functional status and demonstrates the ability to make significant improvements in function in a reasonable and predictable amount of time.  Frequency and Duration min 2x/week  2 weeks      SLP Evaluation Cognition  Overall Cognitive Status: Impaired/Different from baseline (pt feels thinking is little worse) Arousal/Alertness: Awake/alert Orientation Level: Disoriented to situation;Oriented to person;Oriented to place (disoriented to day, oriented to year) Year: 2024 Day of Week: Incorrect Attention: Sustained Sustained Attention: Impaired Sustained Attention Impairment: Verbal basic (internally distracted) Memory: Impaired Memory Impairment: Decreased recall of new information Awareness: Impaired Awareness Impairment: Intellectual impairment Problem Solving: Appears intact Safety/Judgment: Appears intact       Comprehension  Auditory Comprehension Overall Auditory Comprehension: Appears within functional limits for tasks assessed Visual Recognition/Discrimination Discrimination: Not tested Reading Comprehension Reading Status: Not tested    Expression Expression Primary Mode of Expression: Verbal Verbal Expression Overall Verbal Expression: Appears within functional limits for tasks assessed Initiation: No impairment Repetition:  (NT) Naming: Impairment Divergent:  (named 10 animals in one min) Pragmatics: No impairment Written Expression Dominant Hand: Right Written Expression: Not tested   Oral / Motor  Oral Motor/Sensory Function Overall Oral Motor/Sensory  Function: Within functional limits Motor Speech Overall Motor Speech: Appears within functional limits for tasks  assessed Respiration: Within functional limits Phonation: Normal Resonance: Within functional limits Articulation: Within functional limitis Intelligibility: Intelligible Motor Planning: Witnin functional limits            Royce Macadamia 12/10/2022, 12:42 PM

## 2022-12-10 NOTE — Progress Notes (Signed)
Per Dr. Jenene Slicker communication with Dr. Chestine Spore, reassigning him as primary service. Attending updated in EMR.

## 2022-12-10 NOTE — Progress Notes (Signed)
Progress Note  Patient Name: [name] Date of Encounter: 12/10/2022  Primary Cardiologist:   [name], MD   Subjective   No SOB.  Short term memory disorder.   Inpatient Medications    Scheduled Meds:  atorvastatin  80 mg Oral Daily   carvedilol  6.25 mg Oral BID WC   Chlorhexidine Gluconate Cloth  6 each Topical Daily   famotidine  20 mg Oral BID   heparin injection (subcutaneous)  5,000 Units Subcutaneous Q8H   insulin aspart  0-20 Units Subcutaneous Q4H   mouth rinse  15 mL Mouth Rinse 4 times per day   sodium chloride flush  10 mL Intravenous Q12H   sodium chloride flush  3 mL Intravenous Q12H   Continuous Infusions:  cefTRIAXone (ROCEPHIN)  IV 200 mL/hr at 12/10/22 0700   norepinephrine (LEVOPHED) Adult infusion Stopped (12/07/22 1747)   PRN Meds: acetaminophen **OR** acetaminophen (TYLENOL) oral liquid 160 mg/5 mL **OR** acetaminophen, ALPRAZolam, fentaNYL (SUBLIMAZE) injection, hydrOXYzine, mouth rinse, oxyCODONE, sodium chloride flush   Vital Signs    Vitals:   12/10/22 0400 12/10/22 0500 12/10/22 0600 12/10/22 0700  BP: (!) 147/84 (!) 141/78 (!) 157/82 (!) 152/79  Pulse: 82 81 84 85  Resp: 14 14 18 13   Temp: 98.5 F (36.9 C)     TempSrc: Oral     SpO2: 98% 98% 91% 97%  Weight:  (!) 137.5 kg      Intake/Output Summary (Last 24 hours) at 12/10/2022 0801 Last data filed at 12/10/2022 0700 Gross per 24 hour  Intake 558.62 ml  Output 1115 ml  Net -556.38 ml   Filed Weights   12/08/22 0706 12/09/22 0500 12/10/22 0500  Weight: (!) 142.1 kg (!) 138.2 kg (!) 137.5 kg    Telemetry    RRR, rare PVCS, no NSVT Personally Reviewed  ECG    NA - Personally Reviewed  Physical Exam   GEN: No  acute distress.   Neck: No  JVD Cardiac: RRR, no murmurs, rubs, or gallops.  Respiratory: Clear   to auscultation bilaterally. GI: Soft, nontender, non-distended, normal bowel sounds  MS:  No edema; No deformity. Neuro:   Nonfocal  Psych: Oriented  and appropriate.  Obvious short term memory issues   Labs    Chemistry Recent Labs  Lab 12/08/22 0509 12/09/22 0311 12/09/22 1737 12/09/22 1740 12/10/22 0227  NA 132* 137 137 137 136  K 3.6 3.8 4.1 4.2 4.0  CL 102 103  --   --  104  CO2 21* 23  --   --  23  GLUCOSE 121* 119*  --   --  108*  BUN 29* 22*  --   --  20  CREATININE 1.73* 1.57*  --   --  1.32*  CALCIUM 7.5* 8.3*  --   --  8.3*  PROT 5.7* 5.8*  --   --  5.5*  ALBUMIN 2.5* 2.5*  --   --  2.4*  AST 41 29  --   --  22  ALT 83* 63*  --   --  45*  ALKPHOS 32* 33*  --   --  31*  BILITOT 0.8 0.9  --   --  0.8  GFRNONAA 34* 38*  --   --  47*  ANIONGAP 9 11  --   --  9     Hematology Recent Labs  Lab 12/08/22 0509 12/09/22 0311 12/09/22 1737 12/09/22 1740 12/10/22 0227  WBC 13.2* 9.9  --   --  7.6  RBC 4.76 4.77  --   --  4.54  HGB 13.2 13.3 12.9 13.3 12.8  HCT 40.5 41.2 38.0 39.0 39.1  MCV 85.1 86.4  --   --  86.1  MCH 27.7 27.9  --   --  28.2  MCHC 32.6 32.3  --   --  32.7  RDW 14.9 14.8  --   --  14.8  PLT 154 159  --   --  151    Cardiac EnzymesNo results for input(s): "TROPONINI" in the last 168 hours. No results for input(s): "TROPIPOC" in the last 168 hours.   BNPNo results for input(s): "BNP", "PROBNP" in the last 168 hours.   DDimer No results for input(s): "DDIMER" in the last 168 hours.   Radiology    CARDIAC CATHETERIZATION  Result Date: 12/09/2022 Images from the original result were not included. Right & Left Heart Catheterization 12/09/22: Hemodynamic data: LV: 119/7, EDP 17 mmHg.  Ao 110/78, mean 92 mmHg.  No pressure gradient across the aortic valve. RA: 15/13, mean 8 mmHg. RV 47/6, EDP 17 mmHg. PA 46/21, mean 31 mmHg.  PA saturation 66%. PW 18/19, mean 19 mmHg.  AO saturation 92%. CO 6.69, CI 2.83, preserved and normal.  QP/QS 1.00. Angiographic data: RCA: Dominant, has anterior origin, small to normal. LM: Has superior takeoff, a 5 [place] 3.0 guide catheter was utilized to engage.   Normal left main. LAD: Large-caliber vessel giving origin to 2 large diagonals, smooth and normal. RI: Moderate caliber vessel, smooth and normal. LCx: Large vessel giving origin to large OM1 which has secondary branches and continues in the AV groove is a moderate-sized vessel and gives origin to OM 2 from the distal end.  Smooth and normal. Impression and recommendations: Findings consistent with nonischemic cardiomyopathy.  Mildly elevated PA pressure with elevated EDP.    Cardiac Studies   Cath above:  NL coronaries.   Right heart pressures OK.    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  Now status post cardiac arrest.    Assessment & Plan    Cardiac arrest:  Stopped amiodarone.  Right and left heart cath yesterday with results as above.   Likely primary arrhythmic .  EP consulted.   Nonischemic cardiomyopathy:  Previous class I symptoms.. She has not had significant LVH  or evidence of other etiology.   Slowly begin to resume GDMT.  Seems to be euvolemic.  Coreg 6.25 mg bid started.  I will start [place].  Start Entresto.    AKI:  Creat is improved slightly.  Follow.   Leg wounds:  Seen and dressed by wound nurse.  Resulted from post resuscitation trauma.      Disposition:  Work with PT. Transfer to progressive care.   For questions or updates, please contact [facility] Please consult [email or web address] for contact info under Cardiology/STEMI.   Signed, [name], MD  12/10/2022, 8:01 AM

## 2022-12-11 ENCOUNTER — Encounter (HOSPITAL_COMMUNITY): Payer: Self-pay | Admitting: Pulmonary Disease

## 2022-12-11 ENCOUNTER — Other Ambulatory Visit (HOSPITAL_COMMUNITY): Payer: Self-pay

## 2022-12-11 DIAGNOSIS — I469 Cardiac arrest, cause unspecified: Secondary | ICD-10-CM | POA: Diagnosis not present

## 2022-12-11 DIAGNOSIS — I4901 Ventricular fibrillation: Secondary | ICD-10-CM | POA: Diagnosis not present

## 2022-12-11 LAB — CBC
HCT: 38.7 % (ref 36.0–46.0)
Hemoglobin: 12.6 g/dL (ref 12.0–15.0)
MCH: 27.8 pg (ref 26.0–34.0)
MCHC: 32.6 g/dL (ref 30.0–36.0)
MCV: 85.2 fL (ref 80.0–100.0)
Platelets: 185 10*3/uL (ref 150–400)
RBC: 4.54 MIL/uL (ref 3.87–5.11)
RDW: 14.7 % (ref 11.5–15.5)
WBC: 6.9 10*3/uL (ref 4.0–10.5)
nRBC: 0 % (ref 0.0–0.2)

## 2022-12-11 LAB — LIPOPROTEIN A (LPA): Lipoprotein (a): 60.1 nmol/L — ABNORMAL HIGH (ref <75.0)

## 2022-12-11 LAB — CULTURE, BLOOD (ROUTINE X 2)
Culture: NO GROWTH
Culture: NO GROWTH

## 2022-12-11 LAB — GLUCOSE, CAPILLARY
Glucose-Capillary: 122 mg/dL — ABNORMAL HIGH (ref 70–99)
Glucose-Capillary: 140 mg/dL — ABNORMAL HIGH (ref 70–99)
Glucose-Capillary: 117 mg/dL — ABNORMAL HIGH (ref 70–99)
Glucose-Capillary: 131 mg/dL — ABNORMAL HIGH (ref 70–99)

## 2022-12-11 LAB — BASIC METABOLIC PANEL
Anion gap: 8 (ref 5–15)
BUN: 18 mg/dL (ref 6–20)
CO2: 26 mmol/L (ref 22–32)
Calcium: 8.8 mg/dL — ABNORMAL LOW (ref 8.9–10.3)
Chloride: 104 mmol/L (ref 98–111)
Creatinine, Ser: 1.26 mg/dL — ABNORMAL HIGH (ref 0.44–1.00)
GFR, Estimated: 50 mL/min — ABNORMAL LOW (ref >60)
Glucose, Bld: 136 mg/dL — ABNORMAL HIGH (ref 70–99)
Potassium: 3.9 mmol/L (ref 3.5–5.1)
Sodium: 138 mmol/L (ref 135–145)

## 2022-12-11 MED ORDER — METOPROLOL SUCCINATE ER 50 MG PO TB24
50.0000 mg | ORAL_TABLET | Freq: Every day | ORAL | Status: DC
  Administered 2022-12-12 – 2022-12-17 (×6): 50 mg via ORAL
  Filled 2022-12-11 (×6): qty 1

## 2022-12-11 MED ORDER — LOSARTAN POTASSIUM 50 MG PO TABS
50.0000 mg | ORAL_TABLET | Freq: Every day | ORAL | Status: DC
  Administered 2022-12-12 – 2022-12-17 (×6): 50 mg via ORAL
  Filled 2022-12-11 (×6): qty 1

## 2022-12-11 NOTE — Progress Notes (Addendum)
Rounding Note    Patient Name: [name] Date of Encounter: 12/11/2022  [facility] Cardiologist: [name], MD   Subjective   No new complaints, a little foggy on yesterday's conversation about ICD  Inpatient Medications    Scheduled Meds:  atorvastatin  80 mg Oral Daily   carvedilol  6.25 mg Oral BID WC   Chlorhexidine Gluconate Cloth  6 each Topical Daily   dapagliflozin propanediol  10 mg Oral Daily   heparin injection (subcutaneous)  5,000 Units Subcutaneous Q8H   insulin aspart  0-20 Units Subcutaneous TID WC   mouth rinse  15 mL Mouth Rinse 4 times per day   sacubitril-valsartan  1 tablet Oral BID   sodium chloride flush  10 mL Intravenous Q12H   sodium chloride flush  3 mL Intravenous Q12H   Continuous Infusions:  cefTRIAXone (ROCEPHIN)  IV 2 g (12/11/22 0833)   PRN Meds: acetaminophen **OR** acetaminophen (TYLENOL) oral liquid 160 mg/5 mL **OR** acetaminophen, ALPRAZolam, fentaNYL (SUBLIMAZE) injection, hydrOXYzine, mouth rinse, oxyCODONE, sodium chloride flush   Vital Signs    Vitals:   12/11/22 0444 12/11/22 0456 12/11/22 0751 12/11/22 0815  BP: 118/66  133/73 130/71  Pulse:    77  Resp: 20  16   Temp: 98.3 F (36.8 C)  98.4 F (36.9 C)   TempSrc: Oral  Oral   SpO2: 95%  98%   Weight:  (!) 142.1 kg    Height:        Intake/Output Summary (Last 24 hours) at 12/11/2022 1036 Last data filed at 12/11/2022 0900 Gross per 24 hour  Intake 300 ml  Output 1065 ml  Net -765 ml      12/11/2022    4:56 AM 12/10/2022    2:19 PM 12/10/2022    5:00 AM  Last 3 Weights  Weight (lbs) 313 lb 4.4 oz 311 lb 1.1 oz 303 lb 2.1 oz  Weight (kg) 142.1 kg 141.1 kg 137.5 kg      Telemetry    SR, infrequent PVCs - Personally Reviewed  ECG    No new EKGs - Personally Reviewed  Physical Exam   GEN: No acute distress.   Neck: No JVD Cardiac: RRR, no murmurs, rubs, or gallops.  Respiratory: CTA b/l (ant and lat auscultation only) GI:  Soft, nontender, non-distended  MS: No edema; No deformity. [name] dressing is CDI Neuro:  Nonfocal  Psych: Normal affect   Labs    High Sensitivity Troponin:   Recent Labs  Lab 12/06/22 0009 12/06/22 0317 12/06/22 1044 12/06/22 1321 12/06/22 1601  TROPONINIHS 2,849* 3,234* 1,025* 809* 636*     Chemistry Recent Labs  Lab 12/08/22 0509 12/09/22 0311 12/09/22 1737 12/09/22 1740 12/10/22 0227 12/11/22 0342  NA 132* 137   < > 137 136 138  K 3.6 3.8   < > 4.2 4.0 3.9  CL 102 103  --   --  104 104  CO2 21* 23  --   --  23 26  GLUCOSE 121* 119*  --   --  108* 136*  BUN 29* 22*  --   --  20 18  CREATININE 1.73* 1.57*  --   --  1.32* 1.26*  CALCIUM 7.5* 8.3*  --   --  8.3* 8.8*  MG 1.8 1.9  --   --  2.4  --   PROT 5.7* 5.8*  --   --  5.5*  --   ALBUMIN 2.5* 2.5*  --   --  2.4*  --   AST 41 29  --   --  22  --   ALT 83* 63*  --   --  45*  --   ALKPHOS 32* 33*  --   --  31*  --   BILITOT 0.8 0.9  --   --  0.8  --   GFRNONAA 34* 38*  --   --  47* 50*  ANIONGAP 9 11  --   --  9 8   < > = values in this interval not displayed.    Lipids  Recent Labs  Lab 12/06/22 0320  TRIG 208*    Hematology Recent Labs  Lab 12/09/22 0311 12/09/22 1737 12/09/22 1740 12/10/22 0227 12/11/22 0342  WBC 9.9  --   --  7.6 6.9  RBC 4.77  --   --  4.54 4.54  HGB 13.3   < > 13.3 12.8 12.6  HCT 41.2   < > 39.0 39.1 38.7  MCV 86.4  --   --  86.1 85.2  MCH 27.9  --   --  28.2 27.8  MCHC 32.3  --   --  32.7 32.6  RDW 14.8  --   --  14.8 14.7  PLT 159  --   --  151 185   < > = values in this interval not displayed.   Thyroid No results for input(s): "TSH", "FREET4" in the last 168 hours.  BNPNo results for input(s): "BNP", "PROBNP" in the last 168 hours.  DDimer No results for input(s): "DDIMER" in the last 168 hours.   Radiology    DG CHEST PORT 1 VIEW Result Date: 12/10/2022 CLINICAL DATA:  Pneumonia EXAM: PORTABLE CHEST 1 VIEW COMPARISON:  12/06/2022 FINDINGS: Patient is rotated.  Endotracheal and enteric tubes have been removed. Left subclavian central line remains in place. Cardiac silhouette remains mildly enlarged. Improving aeration of the lung bases with mild residual atelectasis. No pleural effusion or pneumothorax. IMPRESSION: Improving aeration of the lung bases with mild residual atelectasis. Electronically Signed   By: Duanne Guess D.O.   On: 12/10/2022 10:28    Cardiac Studies   12/09/22: R/LHC Right & Left Heart Catheterization 12/09/22: Hemodynamic data: LV: 119/7, EDP 17 mmHg.  Ao 110/78, mean 92 mmHg.  No pressure gradient across the aortic valve. RA: 15/13, mean 8 mmHg. RV 47/6, EDP 17 mmHg. PA 46/21, mean 31 mmHg.  PA saturation 66%. PW 18/19, mean 19 mmHg.  AO saturation 92%. CO 6.69, CI 2.83, preserved and normal.  QP/QS 1.00.   Angiographic data: RCA: Dominant, has anterior origin, small to normal. LM: Has superior takeoff, a 5 [place] 3.0 guide catheter was utilized to engage.  Normal left main. LAD: Large-caliber vessel giving origin to 2 large diagonals, smooth and normal. RI: Moderate caliber vessel, smooth and normal. LCx: Large vessel giving origin to large OM1 which has secondary branches and continues in the AV groove is a moderate-sized vessel and gives origin to OM 2 from the distal end.  Smooth and normal.  01/07/23: TTE 1. Left ventricular ejection fraction, by estimation, is 30 to 35%. The  left ventricle has moderately decreased function. The left ventricle  demonstrates global hypokinesis. There is mild left ventricular  hypertrophy. Indeterminate diastolic filling due   to E-A fusion.   2. Right ventricular systolic function is normal. The right ventricular  size is normal.   3. The mitral valve is normal in structure. Trivial mitral valve  regurgitation.  4. The aortic valve is tricuspid. Aortic valve regurgitation is not  visualized.   5. The inferior vena cava IVC not well visualized.   Comparison(s): No  significant change from prior study.    07/10/21: TTE 1. Left ventricular ejection fraction, by estimation, is 30 to 35%. Left  ventricular ejection fraction by 3D volume is 32 %. The left ventricle has  moderately decreased function. The left ventricle demonstrates global  hypokinesis. The left ventricular  internal cavity size was moderately dilated. Left ventricular diastolic  parameters are consistent with Grade I diastolic dysfunction (impaired  relaxation).   2. Right ventricular systolic function is normal. The right ventricular  size is normal. Tricuspid regurgitation signal is inadequate for assessing  PA pressure.   3. The mitral valve is grossly normal. Trivial mitral valve  regurgitation.   4. The aortic valve is tricuspid. Aortic valve regurgitation is not  visualized.   5. The inferior vena cava is normal in size with <50% respiratory  variability, suggesting right atrial pressure of 8 mmHg.   Comparison(s): 12/30/18 EF 50-55%.   Patient Profile     57 y.o. female w/PMHx of chronic CHF (EF waxes/wanes 30's-50's), depression (that is described as severe), HTN, obesity, DM, OSA admitted with OOH cardiac arrest  Assessment & Plan    OOH cardiac arrest NICM The pt reports this goes back 20 years and at least initially a post partum CM   She Carsten Carstarphen need an ICD Dr. [name] re-discussed rational for ICD and the procedure C.MRI may be helpful to better assess her CM > looks like tomorrow  Central line is out Asako Saliba like to give [name] wound time with antibiotics Twilla Khouri look to likely Friday for implant timing  For questions or updates, please contact [facility] Please consult [email or web address] for contact info under        Signed, Sheilah Pigeon, PA-C  12/11/2022, 10:36 AM    I have seen and examined this patient with Francis Dowse.  Agree with above, note added to reflect my findings.  No further ventricular arrhythmias.  Patient has plans for cardiac MRI.  Kynzlee Hucker  need ICD prior to discharge.  GEN: Well nourished, well developed, in no acute distress  HEENT: normal  Neck: no JVD, carotid bruits, or masses Cardiac: RRR; no murmurs, rubs, or gallops,no edema  Respiratory:  clear to auscultation bilaterally, normal work of breathing GI: soft, nontender, nondistended, + BS MS: no deformity or atrophy  Skin: warm and dry, left lower leg with wrapping  neuro:  Strength and sensation are intact Psych: euthymic mood, full affect   Cardiac arrest: No obvious coronary artery disease.  Plan for cardiac MRI.  Has nonischemic cardiomyopathy.  Karrine Kluttz need ICD prior to discharge. Chronic systolic heart failure: Due to nonischemic cardiomyopathy.  No obvious volume overload. Acute renal failure: Continues to improve Leg wounds: Followed by wound care  Conlin Brahm M. Aiyden Lauderback MD 12/11/2022 11:19 AM

## 2022-12-11 NOTE — TOC Initial Note (Addendum)
Transition of Care [facility]) - Initial/Assessment Note    Patient Details  Name: [name] MRN: [id number] Date of Birth: 1965/07/11  Transition of Care [facility]) CM/SW Contact:    Leone Haven, RN Phone Number: 12/11/2022, 4:07 PM  Clinical Narrative:                 Txr from 2H, From home with spouse, has PCP and insurance on file, states has no HH services in place at this time ,  per pt eval rec CIR, she has walker, scotter, and shower chair.  States spouse will transport them home at dc and he is support system, states gets [facility] [address]  Pta ambulatory with walker.  Patient would like to have a diabetic consult. NCM informed staff RN, consult was ordered.  Patient was asking about sleep study and cpap.  Per  Chapman Fitch PA states patient will need to see her PCP regarding sleep study referral. Also she was asking about CAPS services.  NCM informed her to see her PCP regarding CAPS services.  Expected Discharge Plan: Home w Home Health Services Barriers to Discharge: Continued Medical Work up   Patient Goals and CMS Choice Patient states their goals for this hospitalization and ongoing recovery are:: return home with spouse   Choice offered to / list presented to : NA      Expected Discharge Plan and Services In-house Referral: NA Discharge Planning Services: CM Consult Post Acute Care Choice: NA Living arrangements for the past 2 months: Single Family Home                 DME Arranged: N/A DME Agency: NA       HH Arranged: NA          Prior Living Arrangements/Services Living arrangements for the past 2 months: Single Family Home Lives with:: Spouse Patient language and need for interpreter reviewed:: Yes Do you feel safe going back to the place where you live?: Yes      Need for Family Participation in Patient Care: Yes (Comment) Care giver support system in place?: Yes (comment) Current home services: DME (walker, scooter, shower  chair,) Criminal Activity/Legal Involvement Pertinent to Current Situation/Hospitalization: No - Comment as needed  Activities of Daily Living   ADL Screening (condition at time of admission) Independently performs ADLs?: Yes (appropriate for developmental age) Is the patient deaf or have difficulty hearing?: No Does the patient have difficulty seeing, even when wearing glasses/contacts?: No Does the patient have difficulty concentrating, remembering, or making decisions?: No  Permission Sought/Granted Permission sought to share information with : Case Manager Permission granted to share information with : Yes, Verbal Permission Granted              Emotional Assessment Appearance:: Appears stated age Attitude/Demeanor/Rapport: Engaged Affect (typically observed): Appropriate Orientation: : Oriented to Self, Oriented to Place, Oriented to  Time, Oriented to Situation Alcohol / Substance Use: Not Applicable Psych Involvement: No (comment)  Admission diagnosis:  Cardiac arrest [facility]) [I46.9] Cardiac arrest with ventricular fibrillation (HCC) [I46.9, I49.01] Patient Active Problem List   Diagnosis Date Noted   Acute respiratory failure with hypoxia (HCC) 12/10/2022   Wound of left leg 12/10/2022   Ventricular fibrillation (HCC) 12/10/2022   Hyperglycemia 12/09/2022   Cardiac arrest (HCC) 12/09/2022   Acute HFrEF (heart failure with reduced ejection fraction) (HCC) 12/09/2022   Cardiac arrest with ventricular fibrillation (HCC) 12/05/2022   Dysuria 11/15/2022   Onychomycosis 11/15/2022  Near syncope 11/15/2022   Anxiety 10/27/2022   Rosacea, acne 08/13/2022   BRBPR (bright red blood per rectum) 08/13/2022   Vaginal yeast infection 03/06/2022   Primary osteoarthritis of both knees 03/06/2022   Bilateral ovarian cysts 03/06/2022   Major depressive disorder, recurrent episode, severe with anxious distress (HCC) 08/09/2021   ADD (attention deficit disorder) without  hyperactivity 08/09/2021   Need for varicella vaccine 08/09/2021   Urge incontinence 08/01/2021   Tinea corporis 03/14/2021   Hyperlipidemia LDL goal <100 03/01/2021   DDD (degenerative disc disease), lumbar 08/08/2020   Non-seasonal allergic rhinitis due to pollen 07/25/2020   Chronic idiopathic constipation 07/25/2020   Visit for screening mammogram 02/11/2020   Chronic bilateral low back pain without sciatica 02/10/2020   Spondylosis without myelopathy or radiculopathy, lumbar region 12/04/2017   Type 2 diabetes mellitus with hyperglycemia (HCC)    Normal coronary arteries 04/17/2016   OSA on CPAP 10/28/2014   Chronic systolic CHF (congestive heart failure), NYHA class 3 (HCC) 12/06/2013   Hyperlipidemia associated with type 2 diabetes mellitus (HCC), on Zocor 04/04/2011   Hypertension associated with diabetes (HCC) 06/03/2008   Morbid obesity with BMI of 50.0-59.9, adult (HCC) 07/12/2006   NICM (nonischemic cardiomyopathy) (HCC) 07/12/2006   Migraine without status migrainosus, not intractable 07/12/2006   PCP:  [name], MD Pharmacy:   [id number] [place], [place] - [address] [place] [address] Phone: [phone] Fax: [phone]  [facility] - [place], [place] - [address] [place] [address] Phone: [phone] Fax: [phone]  [facility] [address] [place] [address] Phone: [phone] Fax: [phone]     Social Determinants of Health (SDOH) Social History: SDOH Screenings   Food Insecurity: No Food Insecurity (12/06/2022)  Housing: Patient Unable To Answer (12/11/2022)  Transportation Needs: No Transportation Needs (12/11/2022)  Utilities: Not At Risk (12/06/2022)  Alcohol Screen: Low Risk  (12/11/2022)  Depression (PHQ2-9): High Risk (12/05/2022)  Financial Resource Strain: Low Risk  (12/11/2022)  Physical Activity: Inactive  (11/12/2022)  Social Connections: Socially Isolated (08/08/2021)  Stress: Stress Concern Present (11/12/2022)  Tobacco Use: Medium Risk (12/06/2022)   SDOH Interventions: Housing Interventions: Intervention Not Indicated Transportation Interventions: Intervention Not Indicated Alcohol Usage Interventions: Intervention Not Indicated (Score <7) Financial Strain Interventions: Intervention Not Indicated   Readmission Risk Interventions     No data to display

## 2022-12-11 NOTE — Progress Notes (Signed)
Physical Therapy Treatment Patient Details Name: [name] MRN: [id number] DOB: [birth year] Today's Date: 12/11/2022   History of Present Illness 57 year old female admitted 10/10 after suffering cardiac arrest + bystander CPR and eventual ROSC after 40 minutes. R/L heart cath and coronary angiography 10/14 w/ diagnosis of NSTEMI.  Past medical history is significant for hypertension, hyperlipidemia, CHF with EF 30 to 35% on GDMT, DM, and morbid obesity, arthritis, ADD, OSA on CPAP, SS/back pain, DM2.    PT Comments  Pt in bed upon arrival and agreeable to PT session. Worked on transfers and gait training in today's session. Pt improved in ability to stand and walk with minimal assistance and RW. Pt was fatigued after ambulating ~35 feet with decreased speed and cues for safety with ambulation. Pt requires single step commands with increased time to process. Pt also requires verbal cues for sequencing with mobility and adherence to R UE precautions. Pt is progressing well towards goals. Current d/c recs remain appropriate to help patient return to previous level of independence. Acute PT to follow.      If plan is discharge home, recommend the following: A little help with walking and/or transfers;A little help with bathing/dressing/bathroom;Help with stairs or ramp for entrance     Equipment Recommendations   (TBA)       Precautions / Restrictions Precautions Precautions: Fall;Other (comment) (R radial cath 10/14) Precaution Comments: no lift/pull/push >5# from 10/14 to 10/18 Restrictions Weight Bearing Restrictions: No     Mobility  Bed Mobility Overal bed mobility: Needs Assistance Bed Mobility: Supine to Sit     Supine to sit: HOB elevated, Min assist     General bed mobility comments: MinA to complete moving LE off EOB and for trunk rise. Cues to push with L UE to adhere to precautions.    Transfers Overall transfer level: Needs assistance Equipment used: Rolling walker  (2 wheels) Transfers: Sit to/from Stand Sit to Stand: Mod assist, Min assist (x1 ModA, x1 MinA)           General transfer comment: ModA for initial sit to stand for initial rise, progressed to MinA for second stand. Cues for hand placement and adherance to UE precautions.    Ambulation/Gait Ambulation/Gait assistance: Contact guard assist Gait Distance (Feet): 35 Feet (x5, x30) Assistive device: Rolling walker (2 wheels) Gait Pattern/deviations: Step-through pattern, Shuffle, Decreased stride length, Wide base of support Gait velocity: dec     General Gait Details: very slow gait speed with increased medial/lateral sway likely due to body habitus. Pt with good balance with CGA            Balance Overall balance assessment: Needs assistance Sitting-balance support: No upper extremity supported, Feet supported Sitting balance-Leahy Scale: Fair     Standing balance support: Bilateral upper extremity supported, During functional activity, Reliant on assistive device for balance Standing balance-Leahy Scale: Poor Standing balance comment: reliant on RW           Cognition Arousal: Alert Behavior During Therapy: WFL for tasks assessed/performed Overall Cognitive Status: Within Functional Limits for tasks assessed Area of Impairment: Following commands, Memory        Orientation Level: Disoriented to, Time (alert to month and year)   Memory: Decreased recall of precautions Following Commands: Follows one step commands consistently, Follows one step commands with increased time     Problem Solving: Slow processing, Requires verbal cues General Comments: pt benefits from one step commands with increased time to process. Pt has anxiety with  moving and was doubtful of ability to ambulate. Repeated cues for sequencing with mobility. Pt mentioned having difficulty with memory while in the hospital and being confused.           General Comments General comments (skin  integrity, edema, etc.): VSS on supplemental O2, pt did not remember conversations about rehab. Discussed rehab recommendations. Bandages on L LE      Pertinent Vitals/Pain Pain Assessment Pain Assessment: No/denies pain           PT Goals (current goals can now be found in the care plan section) Acute Rehab PT Goals Patient Stated Goal: to get stronger, go home PT Goal Formulation: With patient/family Time For Goal Achievement: 12/22/22 Potential to Achieve Goals: Good Progress towards PT goals: Progressing toward goals    Frequency    Min 1X/week       AM-PAC PT "6 Clicks" Mobility   Outcome Measure  Help needed turning from your back to your side while in a flat bed without using bedrails?: A Little Help needed moving from lying on your back to sitting on the side of a flat bed without using bedrails?: A Lot Help needed moving to and from a bed to a chair (including a wheelchair)?: A Little Help needed standing up from a chair using your arms (e.g., wheelchair or bedside chair)?: A Lot Help needed to walk in hospital room?: A Little Help needed climbing 3-5 steps with a railing? : A Lot 6 Click Score: 15    End of Session Equipment Utilized During Treatment: Oxygen;Gait belt Activity Tolerance: Patient tolerated treatment well Patient left: in chair;with call bell/phone within reach;with chair alarm set Nurse Communication: Mobility status PT Visit Diagnosis: Muscle weakness (generalized) (M62.81);Other abnormalities of gait and mobility (R26.89)     Time: 4098-1191 PT Time Calculation (min) (ACUTE ONLY): 39 min  Charges:    $Gait Training: 8-22 mins $Therapeutic Activity: 23-37 mins PT General Charges $$ ACUTE PT VISIT: 1 Visit                    Hilton Cork, PT, DPT Secure Chat Preferred  Rehab Office [phone]   Arturo Morton Brion Aliment 12/11/2022, 1:52 PM

## 2022-12-11 NOTE — Progress Notes (Signed)
Progress Note  Patient Name: Analea Muller Date of Encounter: 12/11/2022  Primary Cardiologist:   [name], MD   Subjective   She has some cough.  Chest soreness.  Inpatient Medications    Scheduled Meds:  atorvastatin  80 mg Oral Daily   carvedilol  6.25 mg Oral BID WC   Chlorhexidine Gluconate Cloth  6 each Topical Daily   dapagliflozin propanediol  10 mg Oral Daily   heparin injection (subcutaneous)  5,000 Units Subcutaneous Q8H   insulin aspart  0-20 Units Subcutaneous TID WC   mouth rinse  15 mL Mouth Rinse 4 times per day   sacubitril-valsartan  1 tablet Oral BID   sodium chloride flush  10 mL Intravenous Q12H   sodium chloride flush  3 mL Intravenous Q12H   Continuous Infusions:  cefTRIAXone (ROCEPHIN)  IV 2 g (12/11/22 0833)   PRN Meds: acetaminophen **OR** acetaminophen (TYLENOL) oral liquid 160 mg/5 mL **OR** acetaminophen, ALPRAZolam, fentaNYL (SUBLIMAZE) injection, hydrOXYzine, mouth rinse, oxyCODONE, sodium chloride flush   Vital Signs    Vitals:   12/11/22 0444 12/11/22 0456 12/11/22 0751 12/11/22 0815  BP: 118/66  133/73 130/71  Pulse:    77  Resp: 20  16   Temp: 98.3 F (36.8 C)  98.4 F (36.9 C)   TempSrc: Oral  Oral   SpO2: 95%  98%   Weight:  (!) 142.1 kg    Height:        Intake/Output Summary (Last 24 hours) at 12/11/2022 1052 Last data filed at 12/11/2022 0900 Gross per 24 hour  Intake 300 ml  Output 1065 ml  Net -765 ml   Filed Weights   12/10/22 0500 12/10/22 1419 12/11/22 0456  Weight: (!) 137.5 kg (!) 141.1 kg (!) 142.1 kg    Telemetry  NSR:   Personally Reviewed  ECG    NA - Personally Reviewed  Physical Exam   GEN: No  acute distress.   Neck: No  JVD Cardiac: RRR, no murmurs Respiratory: Clear   to auscultation bilaterally. GI: Soft, nontender, non-distended, normal bowel sounds  MS:  No edema; Left leg bandaged.   Neuro:   Nonfocal  Psych: Oriented and appropriate. Less confused today.    Labs     Chemistry Recent Labs  Lab 12/08/22 0509 12/09/22 0311 12/09/22 1737 12/09/22 1740 12/10/22 0227 12/11/22 0342  NA 132* 137   < > 137 136 138  K 3.6 3.8   < > 4.2 4.0 3.9  CL 102 103  --   --  104 104  CO2 21* 23  --   --  23 26  GLUCOSE 121* 119*  --   --  108* 136*  BUN 29* 22*  --   --  20 18  CREATININE 1.73* 1.57*  --   --  1.32* 1.26*  CALCIUM 7.5* 8.3*  --   --  8.3* 8.8*  PROT 5.7* 5.8*  --   --  5.5*  --   ALBUMIN 2.5* 2.5*  --   --  2.4*  --   AST 41 29  --   --  22  --   ALT 83* 63*  --   --  45*  --   ALKPHOS 32* 33*  --   --  31*  --   BILITOT 0.8 0.9  --   --  0.8  --   GFRNONAA 34* 38*  --   --  47* 50*  ANIONGAP 9 11  --   --  9 8   < > = values in this interval not displayed.     Hematology Recent Labs  Lab 12/09/22 0311 12/09/22 1737 12/09/22 1740 12/10/22 0227 12/11/22 0342  WBC 9.9  --   --  7.6 6.9  RBC 4.77  --   --  4.54 4.54  HGB 13.3   < > 13.3 12.8 12.6  HCT 41.2   < > 39.0 39.1 38.7  MCV 86.4  --   --  86.1 85.2  MCH 27.9  --   --  28.2 27.8  MCHC 32.3  --   --  32.7 32.6  RDW 14.8  --   --  14.8 14.7  PLT 159  --   --  151 185   < > = values in this interval not displayed.    Cardiac EnzymesNo results for input(s): "TROPONINI" in the last 168 hours. No results for input(s): "TROPIPOC" in the last 168 hours.   BNPNo results for input(s): "BNP", "PROBNP" in the last 168 hours.   DDimer No results for input(s): "DDIMER" in the last 168 hours.   Radiology    DG CHEST PORT 1 VIEW  Result Date: 12/10/2022 CLINICAL DATA:  Pneumonia EXAM: PORTABLE CHEST 1 VIEW COMPARISON:  12/06/2022 FINDINGS: Patient is rotated. Endotracheal and enteric tubes have been removed. Left subclavian central line remains in place. Cardiac silhouette remains mildly enlarged. Improving aeration of the lung bases with mild residual atelectasis. No pleural effusion or pneumothorax. IMPRESSION: Improving aeration of the lung bases with mild residual atelectasis.  Electronically Signed   By: Duanne Guess D.O.   On: 12/10/2022 10:28   CARDIAC CATHETERIZATION  Result Date: 12/09/2022 Images from the original result were not included. Right & Left Heart Catheterization 12/09/22: Hemodynamic data: LV: 119/7, EDP 17 mmHg.  Ao 110/78, mean 92 mmHg.  No pressure gradient across the aortic valve. RA: 15/13, mean 8 mmHg. RV 47/6, EDP 17 mmHg. PA 46/21, mean 31 mmHg.  PA saturation 66%. PW 18/19, mean 19 mmHg.  AO saturation 92%. CO 6.69, CI 2.83, preserved and normal.  QP/QS 1.00. Angiographic data: RCA: Dominant, has anterior origin, small to normal. LM: Has superior takeoff, a 5 [place] 3.0 guide catheter was utilized to engage.  Normal left main. LAD: Large-caliber vessel giving origin to 2 large diagonals, smooth and normal. RI: Moderate caliber vessel, smooth and normal. LCx: Large vessel giving origin to large OM1 which has secondary branches and continues in the AV groove is a moderate-sized vessel and gives origin to OM 2 from the distal end.  Smooth and normal. Impression and recommendations: Findings consistent with nonischemic cardiomyopathy.  Mildly elevated PA pressure with elevated EDP.    Cardiac Studies   Cath above:  NL coronaries.   Right heart pressures OK.    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  Now status post cardiac arrest.    Assessment & Plan    Cardiac arrest:  Stopped amiodarone.  Right and left heart cath with results as above.   Likely primary arrhythmic .  EP consulted.   ICD possibly Friday.   Nonischemic cardiomyopathy:  Previous class I symptoms.. She has not had significant LVH  or evidence of other etiology.   Was initially thought to be post partum.   I will look for any infiltrative disease with MRI prior to ICD.    AKI:  Creat continues to improve.   Leg wounds:  Seen and dressed  by wound nurse.   Other:  PT to see today.    For questions or updates, please contact [facility] Please  consult [email or web address] for contact info under Cardiology/STEMI.   Signed, [name], MD  12/11/2022, 10:52 AM

## 2022-12-11 NOTE — Progress Notes (Signed)
Heart Failure Nurse Navigator Progress Note  PCP: [name], MD PCP-Cardiologist: [facility] Admission Diagnosis: Cardiac Arrest Admitted from: Home via EMS  Presentation:   [name] presented to the ED following a witnessed VF Cardiac Arrest. CPR started by husband and EMS continued with 40 minutes downtime. Pt has a history of OSA, Hypertension, Hyperlipidemia, CHF, Diabetes Type 2 and Obesity. Heart Cath consistent with nonischemic cardiomyopathy, mildly elevated PA pressure with Elevate EDP. Troponin 3234.Plans for Cardiac MRI on 10/17 with plans ICD placement.  Patient and husband educated on the signs and symptoms of Heart Failure. Daily weights, when to call the doctor or come to the ED.  Diet and Fluid Restrictions, patient reported to drinking a lot of ice water because of dry mouth.  Non-compliant with salt intake. Pt reported to taking her AM meds but not PM meds do to inconvenience. Also to not wearing her C-PAP at night due to "being a pain in the neck". Patient and husband verbalized understanding of all Heart Failure Education.  ECHO/ LVEF: 30-35%  Clinical Course:  Past Medical History:  Diagnosis Date   ADD (attention deficit disorder)    Anxiety    Arthritis    both knees right worse than left   Carpal tunnel syndrome of right wrist    Depression    Essential hypertension, benign    Gallstones    History of smoking 06/22/2016   Hyperlipidemia associated with type 2 diabetes mellitus (HCC), on Zocor 04/04/2011   Hypertension associated with diabetes (HCC) 06/03/2008   Migraines    Morbid obesity (HCC)    Morbid obesity with BMI of 50.0-59.9, adult (HCC) 07/12/2006   NICM (nonischemic cardiomyopathy) (HCC) 07/12/2006   01/16/18 ECHO:    - Procedure narrative: Transthoracic echocardiography. Image   quality was suboptimal. The study was technically difficult.   Intravenous contrast (Definity) was administered. - Left ventricle: The cavity size was moderately dilated.  Wall   thickness was increased in a pattern of mild LVH. Systolic   function was moderately to severely reduced. The estimated   ejection fraction w   Normal coronary arteries 04/17/2016   OSA on CPAP 10/28/2014   Spinal stenosis    back pain   Spondylosis without myelopathy or radiculopathy, lumbar region 12/04/2017   Type 2 diabetes mellitus with hyperglycemia (HCC)      Social History   Socioeconomic History   Marital status: Married    Spouse name: [name]   Number of children: 1   Years of education: Not on file   Highest education level: Associate degree: academic program  Occupational History   Not on file  Tobacco Use   Smoking status: Former    Current packs/day: 0.00    Types: Cigarettes    Quit date: 02/25/1997    Years since quitting: 25.8   Smokeless tobacco: Never   Tobacco comments:    Married, lives with spouse (when he is not traveling) and son  Vaping Use   Vaping status: Never Used  Substance and Sexual Activity   Alcohol use: No   Drug use: No   Sexual activity: Not Currently    Partners: Male    Birth control/protection: None  Other Topics Concern   Not on file  Social History Narrative   Not on file   Social Determinants of Health   Financial Resource Strain: Low Risk  (12/11/2022)   Overall Financial Resource Strain (CARDIA)    Difficulty of Paying Living Expenses: Not hard at all  Food Insecurity: No Food Insecurity (12/06/2022)   Hunger Vital Sign    Worried About Running Out of Food in the Last Year: Never true    Ran Out of Food in the Last Year: Never true  Transportation Needs: No Transportation Needs (12/11/2022)   PRAPARE - Administrator, Civil Service (Medical): No    Lack of Transportation (Non-Medical): No  Physical Activity: Inactive (11/12/2022)   Exercise Vital Sign    Days of Exercise per Week: 0 days    Minutes of Exercise per Session: 0 min  Stress: Stress Concern Present (11/12/2022)   [employer] of  Occupational Health - Occupational Stress Questionnaire    Feeling of Stress : Very much  Social Connections: Socially Isolated (08/08/2021)   Social Connection and Isolation Panel [NHANES]    Frequency of Communication with Friends and Family: Once a week    Frequency of Social Gatherings with Friends and Family: Once a week    Attends Religious Services: Never    Database administrator or Organizations: No    Attends Engineer, structural: Never    Marital Status: Married   Water engineer and Provision:  Detailed education and instructions provided on heart failure disease management including the following:  Signs and symptoms of Heart Failure When to call the physician Importance of daily weights Low sodium diet Fluid restriction Medication management Anticipated future follow-up appointments  Patient education given on each of the above topics.  Patient acknowledges understanding via teach back method and acceptance of all instructions.  Education Materials:  "Living Better With Heart Failure" Booklet, HF zone tool, & Daily Weight Tracker Tool.  Patient has scale at home: Yes-but does not go up to her current weight.  Encourage her to get a new one. Patient has pill box at home: Yes-Her husband fills them for her.    High Risk Criteria for Readmission and/or Poor Patient Outcomes: Heart failure hospital admissions (last 6 months): 0  No Show rate: 3 Difficult social situation: No Demonstrates medication adherence: Is good at taking her AM meds and not as good at taking her PM meds Primary Language: English Literacy level: Reading, Writing & Comprehension  Barriers of Care:   Medication Non-Compliance (Evening Meds) Diet & Fluid Restrictions Salt Intake  Considerations/Referrals:   Referral made to Heart Failure Pharmacist Stewardship: Yes Referral made to Heart Failure CSW/NCM TOC: No Referral made to Heart & Vascular TOC clinic: Yes-10/28 @ 9:00  AM  Items for Follow-up on DC/TOC: Medication Compliance Diet & Fluid Restrictions ( Diet, Fluid and Salt Intake) Daily Weights Continued Heart Failure Education   Roxy Horseman, RN, BSN [facility] Heart Failure Navigator Secure Chat Only

## 2022-12-11 NOTE — Progress Notes (Signed)
   12/10/22 2221  BiPAP/CPAP/SIPAP  Reason BIPAP/CPAP not in use Non-compliant
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
## 2022-12-11 NOTE — Plan of Care (Signed)
Problem: Education: Goal: Ability to describe self-care measures that may prevent or decrease complications (Diabetes Survival Skills Education) will improve Outcome: Progressing Goal: Individualized Educational Video(s) Outcome: Progressing   Problem: Fluid Volume: Goal: Ability to maintain a balanced intake and output will improve Outcome: Progressing

## 2022-12-11 NOTE — Progress Notes (Signed)
Heart Failure Stewardship Pharmacist Progress Note   PCP: [name], MD PCP-Cardiologist: [name], MD    HPI:  61YOF with a PMH of HFrEF with dilated cardiomyopathy, obesity (BMI 50-59), T2DM, OSA not on CPAP, HLD, depression/bipolar II - initially presenting to ED after witness cardiac arrest with bystander CPR on 10/10.V fib and V tach on arrival. Prolonged ACLS for ~40 min with intermittent ROSC, received 6 shocks. Intubated and admitted to ICU.  Pt was previously established with Dr. Biehle Lions. Had Cath in 2018 showing EF 25-30%, no CAD. 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 Hospitalized with acute on chronic systolic and diastolic HF in 2019, EF 30-35% at that time. Echo from May 2023 was 30-35%. Managed on GDMT.   Echo this admission (10/12) demonstrated EF 30-35%, moderately decreased LV function, global hypokinesis, mild LVH, normal RV- largely unchanged from previous study. [facility] 10/14 demonstrated normal coronary arteries, mildly elevated PA mean 31, PW mean 19, and LVEDP 17, CO 6.7, CI 2.8. EP following, planning for ICD placement after line holiday.   Was on BB, ARNI, SGLT2i, and loop diuretic. Hx shows she was on spironolactone a couple years ago- unclear why discontinued. Per pt and husband - she pretty much does not take any PM medications (this would include carvedilol, Entresto, and Xigduo). Struggles with depression, exacerbated by chronic illness - and reports that she often sleeps late, does not have energy to get up. Therefore, she may take her first doses of medications much later in the day. Her husband manages her medications - uses a pill box, picks them up from the pharmacy. Prior to her arrest- she reports that she did not have nocturnal SOB. Engaged in limited physical activity - but had recently joined the [employer] and participated in therapy at [facility]. Denies significant issues with fluid accumulation in recent  years. Takes lasix intermittently - skips doses when she is going out.   Requests optimization of medication regimen from an adherence standpoint - would prefer daily medications, and more clear instructions on how to take potassium supplement in relation to lasix.   Current HF Medications: Beta Blocker: carvedilol 6.25 mg PO BID ACE/ARB/ARNI: Entresto (sacubitril/valsartan) 24-26 mg PO BID (added 10/15) SGLT2i: dapagliflozin (Farxiga) 10 mg PO daily  (added 10/15)  Prior to admission HF Medications: Diuretic: furosemide 80 mg PO daily (prescribed Sept 2024)   Beta blocker: carvedilol 25 mg PO BID ACE/ARB/ARNI: Entresto (sacubitril/valsartan) 97-103 mg PO BID SGLT2i: dapagliflozin-metformin (Xiguduo XR) 11-998 mg PO BID  Also on Kcl 40 mEq daily at home  Pertinent Lab Values: Serum creatinine 1.32 > 1.26 (BL 0.8-0.9), eGFR 50 mL/min, BUN 18, Potassium 3.9, Sodium 138, Magnesium 2.4, A1c 6.6%, LFTs initially elevated now AST 22, ALT 45. LDL 117 (June 2024).   Vital Signs: Weight: 313 lbs (admission weight: 304 lbs) Blood pressure: 130-140s/60-70s  Heart rate: 70-80s  I/O: net -0.6L yesterday; net -1.3L since admission  Medication Assistance / Insurance Benefits Check: Does the patient have prescription insurance?  Yes Type of insurance plan: Medicare/Medicaid (disability) - medications are affordable   Outpatient Pharmacy:  Prior to admission outpatient pharmacy: [facility] [address] Is the patient willing to use [facility] TOC pharmacy at discharge? Yes Is the patient willing to transition their outpatient pharmacy to utilize a [facility] outpatient pharmacy?   No - [facility]    Assessment: 1. Acute on chronic systolic CHF (LVEF 30-35%), due to nonischemic cardiomyopathy,  now s/p cardiac arrest. NYHA class III symptoms. Pt remains on O2 2/2 post-arrest recovery.  - Weight has increased with post-arrest supportive care - difficult to assess LE edema with  leg wounds and resuscitation trauma. With optimized GDMT, pt may have reduced need for daily diuretic - Agree with recent addition of SGLT2i and resuming Entresto - however, from an adherence standpoint daily ARB like losartan may be better.  - Feel that pt would likely tolerate MRA, and would help with continued diuresis if patient unable to be adherent to [facility]. Scr stable, K wnl. May reduce need for potassium supplementation at discharge. Not currently experiencing dizziness/lightheadedness - but reports that her body feels weird.  - Adherence largely limited by severe depression - would recommend dosing consolidation as much as possible, including potentially transitioning to once daily beta-blocker. At discharge - could recommend taking Xigduo (dapagliflozin/metformin) (06-998) 2 tabs once daily or transition to Xigduo 11-998 once daily, to ensure pt gets full SGLT2i dose - this may reduce glycemic control.   Plan: 1) Medication changes recommended at this time: - Switch carvediol to metoprolol succinate 25 mg PO daily for adherence - Switch Entresto to losartan 25 mg PO daily for adherence - Start spironolactone 12.5 mg PO daily - Continue dapagliflozin 10 mg PO daily  2) Patient assistance: - No patient assistance needs noted at this time.   3)  Education  - Initial education complete, including need to remain adherent to prescribed medications to provide the most benefit in terms of symptoms and reduction in morbidity/mortality. Educated pt that there are medications we can consider that would consolidate her regimen to largely once daily medications. Pt aware that medications will be titrated while inpatient, and may continued to be adjusted at follow-up. Full education to be completed prior to discharge.  Nils Pyle, PharmD PGY1 Pharmacy Resident

## 2022-12-12 ENCOUNTER — Inpatient Hospital Stay (HOSPITAL_COMMUNITY): Payer: Medicare Other

## 2022-12-12 ENCOUNTER — Ambulatory Visit (HOSPITAL_BASED_OUTPATIENT_CLINIC_OR_DEPARTMENT_OTHER): Payer: Medicare Other | Admitting: Physical Therapy

## 2022-12-12 ENCOUNTER — Other Ambulatory Visit (HOSPITAL_COMMUNITY): Payer: Self-pay

## 2022-12-12 DIAGNOSIS — I4901 Ventricular fibrillation: Secondary | ICD-10-CM | POA: Diagnosis not present

## 2022-12-12 DIAGNOSIS — I469 Cardiac arrest, cause unspecified: Secondary | ICD-10-CM | POA: Diagnosis not present

## 2022-12-12 DIAGNOSIS — I428 Other cardiomyopathies: Secondary | ICD-10-CM

## 2022-12-12 DIAGNOSIS — I5022 Chronic systolic (congestive) heart failure: Secondary | ICD-10-CM

## 2022-12-12 LAB — CBC
HCT: 38.7 % (ref 36.0–46.0)
Hemoglobin: 12.9 g/dL (ref 12.0–15.0)
MCH: 28.8 pg (ref 26.0–34.0)
MCHC: 33.3 g/dL (ref 30.0–36.0)
MCV: 86.4 fL (ref 80.0–100.0)
Platelets: 192 10*3/uL (ref 150–400)
RBC: 4.48 MIL/uL (ref 3.87–5.11)
RDW: 14.7 % (ref 11.5–15.5)
WBC: 7 10*3/uL (ref 4.0–10.5)
nRBC: 0 % (ref 0.0–0.2)

## 2022-12-12 LAB — GLUCOSE, CAPILLARY
Glucose-Capillary: 122 mg/dL — ABNORMAL HIGH (ref 70–99)
Glucose-Capillary: 126 mg/dL — ABNORMAL HIGH (ref 70–99)
Glucose-Capillary: 123 mg/dL — ABNORMAL HIGH (ref 70–99)
Glucose-Capillary: 128 mg/dL — ABNORMAL HIGH (ref 70–99)

## 2022-12-12 LAB — BASIC METABOLIC PANEL
Anion gap: 10 (ref 5–15)
BUN: 17 mg/dL (ref 6–20)
CO2: 23 mmol/L (ref 22–32)
Calcium: 8.7 mg/dL — ABNORMAL LOW (ref 8.9–10.3)
Chloride: 108 mmol/L (ref 98–111)
Creatinine, Ser: 1.25 mg/dL — ABNORMAL HIGH (ref 0.44–1.00)
GFR, Estimated: 50 mL/min — ABNORMAL LOW (ref >60)
Glucose, Bld: 126 mg/dL — ABNORMAL HIGH (ref 70–99)
Potassium: 3.7 mmol/L (ref 3.5–5.1)
Sodium: 141 mmol/L (ref 135–145)

## 2022-12-12 MED ORDER — SPIRONOLACTONE 12.5 MG HALF TABLET
12.5000 mg | ORAL_TABLET | Freq: Every day | ORAL | Status: DC
  Administered 2022-12-12 – 2022-12-17 (×6): 12.5 mg via ORAL
  Filled 2022-12-12 (×7): qty 1

## 2022-12-12 MED ORDER — LIVING WELL WITH DIABETES BOOK
Freq: Once | Status: AC
  Filled 2022-12-12: qty 1

## 2022-12-12 MED ORDER — GADOBUTROL 1 MMOL/ML IV SOLN
10.0000 mL | Freq: Once | INTRAVENOUS | Status: AC | PRN
  Administered 2022-12-12: 10 mL via INTRAVENOUS

## 2022-12-12 NOTE — Inpatient Diabetes Management (Signed)
Inpatient Diabetes Program Recommendations  AACE/ADA: New Consensus Statement on Inpatient Glycemic Control (2015)  Target Ranges:  Prepandial:   less than 140 mg/dL      Peak postprandial:   less than 180 mg/dL (1-2 hours)      Critically ill patients:  140 - 180 mg/dL   Lab Results  Component Value Date   GLUCAP 126 (H) 12/12/2022   HGBA1C 6.6 (H) 12/07/2022    Review of Glycemic Control  Diabetes history: DM2 Outpatient Diabetes medications: Xigduo 11-998 mg daily, Mounjaro 2.5 mg weekly Current orders for Inpatient glycemic control: Farxiga 10 mg daily, Novolog 0-20 units correction scale TID  Inpatient Diabetes Program Recommendations:   Spoke with patient at the bedside. Patient was wanting information about Greggory Keen and how it works. States that she has been taking it over the last month, but had not taken it this past week. She did not know about the situation that brought her to the hospital until today. She is very eager to get information about diabetes, what she should eat, medications, etc. Patient does not like to have her fingers stuck for blood sugars, is afraid of pain. She does have ADD and has to deal with stress at home. Suggested that she talk with her physician about continuous glucose monitor such as Freestyle Libre 3, if her insurance will cover without taking insulin. Patient would like to talk with a dietician about things that she can eat, would be [facility] in [facility] for outpatient education. Would need a physician order for her to attend the clinic.  Will order Living Well with Diabetes booklet, add Exit notes on DM, will order dietician consult. Discussed her HgbA1C of 6.6% and what her blood sugars have been running on an average. Recommend that she needs to check blood sugars at home.   Will continue to monitor blood sugars while in the hospital.  Smith Mince RN BSN CDE Diabetes Coordinator Pager: [phone]  8am-5pm

## 2022-12-12 NOTE — Progress Notes (Signed)
Refused   12/12/22 0005  BiPAP/CPAP/SIPAP  Reason BIPAP/CPAP not in use Non-compliant  BiPAP/CPAP /SiPAP Vitals  Resp 17  SpO2 98 %  MEWS Score/Color  MEWS Score 0  MEWS Score Color [place] Si

## 2022-12-12 NOTE — Progress Notes (Addendum)
Rounding Note    Patient Name: [name] Date of Encounter: 12/12/2022  [facility] Cardiologist: [name], MD   Subjective   Chest wall pain, [name] hurts, denies SOB.  Ambulated some today  Inpatient Medications    Scheduled Meds:  atorvastatin  80 mg Oral Daily   Chlorhexidine Gluconate Cloth  6 each Topical Daily   dapagliflozin propanediol  10 mg Oral Daily   heparin injection (subcutaneous)  5,000 Units Subcutaneous Q8H   insulin aspart  0-20 Units Subcutaneous TID WC   losartan  50 mg Oral Daily   metoprolol succinate  50 mg Oral Daily   mouth rinse  15 mL Mouth Rinse 4 times per day   sodium chloride flush  10 mL Intravenous Q12H   sodium chloride flush  3 mL Intravenous Q12H   spironolactone  12.5 mg Oral Daily   Continuous Infusions:  cefTRIAXone (ROCEPHIN)  IV 2 g (12/12/22 1007)   PRN Meds: acetaminophen **OR** acetaminophen (TYLENOL) oral liquid 160 mg/5 mL **OR** acetaminophen, ALPRAZolam, fentaNYL (SUBLIMAZE) injection, hydrOXYzine, mouth rinse, oxyCODONE, sodium chloride flush   Vital Signs    Vitals:   12/12/22 0005 12/12/22 0333 12/12/22 1001 12/12/22 1222  BP:  125/68 131/83   Pulse:  75 82   Resp: 17 12 17    Temp:  98.4 F (36.9 C)  98 F (36.7 C)  TempSrc:  Oral    SpO2: 98% 99%  98%  Weight:  (!) 140.5 kg    Height:        Intake/Output Summary (Last 24 hours) at 12/12/2022 1438 Last data filed at 12/12/2022 1300 Gross per 24 hour  Intake 608 ml  Output 1600 ml  Net -992 ml      12/12/2022    1:01 PM 12/12/2022    3:33 AM 12/11/2022    4:56 AM  Last 3 Weights  Weight (lbs) -- 309 lb 11.9 oz 313 lb 4.4 oz  Weight (kg) -- 140.5 kg 142.1 kg      Telemetry    SR, infrequent PVCs - Personally Reviewed  ECG    No new EKGs - Personally Reviewed  Physical Exam    Unchanged exam GEN: No acute distress.   Neck: No JVD Cardiac: RRR, no murmurs, rubs, or gallops.  Respiratory: CTA b/l (ant and lat  auscultation only) GI: Soft, nontender, non-distended  MS: No edema; No deformity.  [name] dressing is some evidence of drainage, not completely saturated Neuro:  Nonfocal  Psych: Normal affect   Labs    High Sensitivity Troponin:   Recent Labs  Lab 12/06/22 0009 12/06/22 0317 12/06/22 1044 12/06/22 1321 12/06/22 1601  TROPONINIHS 2,849* 3,234* 1,025* 809* 636*     Chemistry Recent Labs  Lab 12/08/22 0509 12/09/22 0311 12/09/22 1737 12/10/22 0227 12/11/22 0342 12/12/22 0305  NA 132* 137   < > 136 138 141  K 3.6 3.8   < > 4.0 3.9 3.7  CL 102 103  --  104 104 108  CO2 21* 23  --  23 26 23   GLUCOSE 121* 119*  --  108* 136* 126*  BUN 29* 22*  --  20 18 17   CREATININE 1.73* 1.57*  --  1.32* 1.26* 1.25*  CALCIUM 7.5* 8.3*  --  8.3* 8.8* 8.7*  MG 1.8 1.9  --  2.4  --   --   PROT 5.7* 5.8*  --  5.5*  --   --   ALBUMIN 2.5* 2.5*  --  2.4*  --   --   AST 41 29  --  22  --   --   ALT 83* 63*  --  45*  --   --   ALKPHOS 32* 33*  --  31*  --   --   BILITOT 0.8 0.9  --  0.8  --   --   GFRNONAA 34* 38*  --  47* 50* 50*  ANIONGAP 9 11  --  9 8 10    < > = values in this interval not displayed.    Lipids  Recent Labs  Lab 12/06/22 0320  TRIG 208*    Hematology Recent Labs  Lab 12/10/22 0227 12/11/22 0342 12/12/22 0305  WBC 7.6 6.9 7.0  RBC 4.54 4.54 4.48  HGB 12.8 12.6 12.9  HCT 39.1 38.7 38.7  MCV 86.1 85.2 86.4  MCH 28.2 27.8 28.8  MCHC 32.7 32.6 33.3  RDW 14.8 14.7 14.7  PLT 151 185 192   Thyroid No results for input(s): "TSH", "FREET4" in the last 168 hours.  BNPNo results for input(s): "BNP", "PROBNP" in the last 168 hours.  DDimer No results for input(s): "DDIMER" in the last 168 hours.   Radiology    DG CHEST PORT 1 VIEW Result Date: 12/10/2022 CLINICAL DATA:  Pneumonia EXAM: PORTABLE CHEST 1 VIEW COMPARISON:  12/06/2022 FINDINGS: Patient is rotated. Endotracheal and enteric tubes have been removed. Left subclavian central line remains in place. Cardiac  silhouette remains mildly enlarged. Improving aeration of the lung bases with mild residual atelectasis. No pleural effusion or pneumothorax. IMPRESSION: Improving aeration of the lung bases with mild residual atelectasis. Electronically Signed   By: Duanne Guess D.O.   On: 12/10/2022 10:28    Cardiac Studies   C.MRI Done/pending read   12/09/22: R/LHC Right & Left Heart Catheterization 12/09/22: Hemodynamic data: LV: 119/7, EDP 17 mmHg.  Ao 110/78, mean 92 mmHg.  No pressure gradient across the aortic valve. RA: 15/13, mean 8 mmHg. RV 47/6, EDP 17 mmHg. PA 46/21, mean 31 mmHg.  PA saturation 66%. PW 18/19, mean 19 mmHg.  AO saturation 92%. CO 6.69, CI 2.83, preserved and normal.  QP/QS 1.00.   Angiographic data: RCA: Dominant, has anterior origin, small to normal. LM: Has superior takeoff, a 5 [place] 3.0 guide catheter was utilized to engage.  Normal left main. LAD: Large-caliber vessel giving origin to 2 large diagonals, smooth and normal. RI: Moderate caliber vessel, smooth and normal. LCx: Large vessel giving origin to large OM1 which has secondary branches and continues in the AV groove is a moderate-sized vessel and gives origin to OM 2 from the distal end.  Smooth and normal.  01/07/23: TTE 1. Left ventricular ejection fraction, by estimation, is 30 to 35%. The  left ventricle has moderately decreased function. The left ventricle  demonstrates global hypokinesis. There is mild left ventricular  hypertrophy. Indeterminate diastolic filling due   to E-A fusion.   2. Right ventricular systolic function is normal. The right ventricular  size is normal.   3. The mitral valve is normal in structure. Trivial mitral valve  regurgitation.   4. The aortic valve is tricuspid. Aortic valve regurgitation is not  visualized.   5. The inferior vena cava IVC not well visualized.   Comparison(s): No significant change from prior study.    07/10/21: TTE 1. Left ventricular  ejection fraction, by estimation, is 30 to 35%. Left  ventricular ejection fraction by 3D volume is 32 %. The  left ventricle has  moderately decreased function. The left ventricle demonstrates global  hypokinesis. The left ventricular  internal cavity size was moderately dilated. Left ventricular diastolic  parameters are consistent with Grade I diastolic dysfunction (impaired  relaxation).   2. Right ventricular systolic function is normal. The right ventricular  size is normal. Tricuspid regurgitation signal is inadequate for assessing  PA pressure.   3. The mitral valve is grossly normal. Trivial mitral valve  regurgitation.   4. The aortic valve is tricuspid. Aortic valve regurgitation is not  visualized.   5. The inferior vena cava is normal in size with <50% respiratory  variability, suggesting right atrial pressure of 8 mmHg.   Comparison(s): 12/30/18 EF 50-55%.   Patient Profile     57 y.o. female w/PMHx of chronic CHF (EF waxes/wanes 30's-50's), depression (that is described as severe), HTN, obesity, DM, OSA admitted with OOH cardiac arrest  Assessment & Plan    OOH cardiac arrest NICM The pt reports this goes back 20 years and at least initially a post partum CM   Samantha [name] need an ICD  Dr. [name] re-discussed rational for ICD and the procedure Central line pulled on 12/10/22  Her LE wound is worrisome and gives her a marked increase in infection risk D/w RN, dressing changed this AM with attending MD, remains unchanged from Tuesday when I last saw it, continues to drain significantly with some bleeding as well. Uncertain though I don't think her body habitus precludes life vest, discussed with rep, I have ordered life vest and they [employer] come by to measure and determine if we can pursue life vest prior to ICD implant  I discussed with the pt and her husband at bedside, they understand  For questions or updates, please contact [facility] Please consult  [email or web address] for contact info under        Signed, Sheilah Pigeon, PA-C  12/12/2022, 2:38 PM    I have seen and examined this patient with [name].  Agree with above, note added to reflect my findings.  Patient resting in bed.  Complaining of musculoskeletal chest pain and pain in her left lower leg  GEN: Well nourished, well developed, in no acute distress  HEENT: normal  Neck: no JVD, carotid bruits, or masses Cardiac: RRR; no murmurs, rubs, or gallops,no edema  Respiratory:  clear to auscultation bilaterally, normal work of breathing GI: soft, nontender, nondistended, + BS MS: no deformity or atrophy  Skin: warm and dry, left lower leg wrapped in place Neuro:  Strength and sensation are intact Psych: euthymic mood, full affect   Out-of-hospital cardiac arrest Chronic systolic heart failure due to nonischemic cardiomyopathy Cardiac MRI is pending as a cause of her cardiomyopathy and possible arrest.  [name] need a defibrillator.  Samantha does have a left lower extremity wound.  Due to the wound, would hold off on implantable defibrillator for now assuming that body habitus does not preclude her from wearing a LifeVest.  [name] M. Aleesia Henney MD 12/12/2022 5:01 PM

## 2022-12-12 NOTE — Progress Notes (Signed)
SATURATION QUALIFICATIONS: (This note is used to comply with regulatory documentation for home oxygen)  Patient Saturations on Room Air at Rest = 91%  Patient Saturations on Room Air while Ambulating = 87%  Patient Saturations on 2 Liters of oxygen while Ambulating = 94%  Please briefly explain why patient needs home oxygen:Pt requires 2LO2 with activity to keep sats >88%.  Will need home O2.  Khamiya Varin M,PT Acute Rehab Services [phone]

## 2022-12-12 NOTE — Progress Notes (Signed)
Heart Failure Stewardship Pharmacist Progress Note   PCP: [name], MD PCP-Cardiologist: [name], MD    HPI:  89YOF with a PMH of HFrEF with dilated cardiomyopathy, obesity (BMI 50-59), T2DM, OSA not on CPAP, HLD, depression/bipolar II - initially presenting to ED after witness cardiac arrest with bystander CPR on 10/10.V fib and V tach on arrival. Prolonged ACLS for ~40 min with intermittent ROSC, received 6 shocks. Intubated and admitted to ICU.  Pt was previously established with Dr. Chelan Lions. Had Cath in 2018 showing EF 25-30%, no CAD. 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 Hospitalized with acute on chronic systolic and diastolic HF in 2019, EF 30-35% at that time. Echo from May 2023 was 30-35%. Managed on GDMT.   Echo this admission (10/12) demonstrated EF 30-35%, moderately decreased LV function, global hypokinesis, mild LVH, normal RV- largely unchanged from previous study. [facility] 10/14 demonstrated normal coronary arteries, mildly elevated PA mean 31, PW mean 19, and LVEDP 17, CO 6.7, CI 2.8. EP following, planning for ICD placement after line holiday.   Was on BB, ARNI, SGLT2i, and loop diuretic. Hx shows she was on spironolactone a couple years ago- unclear why discontinued. Per pt and husband - she pretty much does not take any PM medications (this would include carvedilol, Entresto, and Xigduo). Struggles with depression, exacerbated by chronic illness - and reports that she often sleeps late, does not have energy to get up. Therefore, she may take her first doses of medications much later in the day. Her husband manages her medications - uses a pill box, picks them up from the pharmacy. Prior to her arrest- she reports that she did not have nocturnal SOB. Engaged in limited physical activity - but had recently joined the [employer] and participated in therapy at [facility]. Denies significant issues with fluid accumulation in recent  years. Takes lasix intermittently - skips doses when she is going out.   Requests optimization of medication regimen from an adherence standpoint - would prefer daily medications, and more clear instructions on how to take potassium supplement in relation to lasix.   Primary concern today is obtaining medication for treatment of ADHD. Appears methylphenidate 5 mg PO BID was prescribed in September. Per test claim - PA required.   Current HF Medications: Beta Blocker: metoprolol succinate 50 mg PO daily ACE/ARB/ARNI: losartan 50 mg PO daily  SGLT2i: dapagliflozin (Farxiga) 10 mg PO daily    Prior to admission HF Medications: Diuretic: furosemide 80 mg PO daily  Beta blocker: carvedilol 25 mg PO BID ACE/ARB/ARNI: Entresto (sacubitril/valsartan) 97-103 mg PO BID SGLT2i: dapagliflozin-metformin (Xiguduo XR) 11-998 mg PO daily  Also on Kcl 40 mEq daily at home  Pertinent Lab Values: Serum creatinine 1.25 (BL 0.8-0.9), eGFR 50 mL/min, BUN 17, Potassium 3.7, Sodium 141, Magnesium 2.4, A1c 6.6%, LFTs initially elevated now AST 22, ALT 45. LDL 117 (June 2024).   Vital Signs: Weight: 309 lbs (admission weight: 304 lbs) Blood pressure: 120-130s/60-70s Heart rate: 70-80s I/O: net -1L yesterday; net -1.8L since admission  Medication Assistance / Insurance Benefits Check: Does the patient have prescription insurance?  Yes Type of insurance plan: Medicare/Medicaid (disability) - medications are affordable   Outpatient Pharmacy:  Prior to admission [facility]: [facility] [address] Is the patient willing to use [facility] TOC pharmacy at discharge? Yes Is the patient willing to transition their outpatient pharmacy to utilize a [facility] outpatient pharmacy?   No - [facility]  per husband    Assessment: 1. Acute on chronic systolic CHF (LVEF 30-35%), due to nonischemic cardiomyopathy, now s/p cardiac arrest. NYHA class III symptoms. Pt remains on O2 2/2 post-arrest  recovery.  - Difficult to assess LE edema with leg wounds and resuscitation trauma. With optimized GDMT, pt may have reduced need for daily diuretic - Although tx with ARNI is ideal, transitioned pt to once daily ARB and beta-blocker, after pt and husband endorsed she is unable to be adherent to twice daily medications. Adherence primarily limited by severe depression, ADHD, wake/sleep schedule.  - Pt would likely tolerate MRA, and would help with continued diuresis. May reduce need for K supplement at discharge. Continues to deny dizziness/lightheadedness. Ambulated in room and down hall yesterday. Scr above BL post-arrest, but continues to improve.   Plan: 1) Medication changes recommended at this time: - Continue metoprolol succinate 50 mg PO daily - Continue losartan 50 mg PO daily - Continue dapagliflozin 10 mg PO daily - Consider starting spironolactone 12.5 mg PO daily  2) Patient assistance: - No patient assistance needs noted at this time.   3)  Education  - Initial education complete, including need to remain adherent to prescribed medications to provide the most benefit in terms of symptoms and reduction in morbidity/mortality. Educated pt that there are medications we can consider that would consolidate her regimen to largely once daily medications. Pt aware that medications will be titrated while inpatient, and may continued to be adjusted at follow-up.  - Will review medication changes with pt husband, who manages her medications, when regimen is finalized prior to discharge.  Nils Pyle, PharmD PGY1 Pharmacy Resident

## 2022-12-12 NOTE — Progress Notes (Signed)
Speech Language Pathology Treatment: Cognitive-Linquistic  Patient Details Name: [name] MRN: [id number] DOB: [birth year] Today's Date: 12/12/2022 Time: 1324-4010 SLP Time Calculation (min) (ACUTE ONLY): 25 min  Assessment / Plan / Recommendation Clinical Impression  Pt seen for cognition and assisted sitting more upright in chair. She is making progress since initial assessment.  was able to recall reason for admission today and details of conversations with 2 physicians. Recalled she is having a defibrillator placed and that the MD is working on getting her Ritalin approved. She did need clarification from RN on what medication she was getting after RN previously told her. Pt participated in activity of listening to voicemail, taking notes and answering questions with 70% accuracy. She needed cues to write more details from voicemail and request SLP to replay voicemail. Pt showed SLP her phone where she has taken specific multiple notes in the past. Encouraged her to continue to use her phone for note taking and recall. ST to continue. She was able to sustain her attention but was somewhat tangential on ADD information. She would benefit from inpatient rehab.    HPI HPI: 57 year old female admitted 10/10 after suffering cardiac arrest + bystander CPR and eventual ROSC after 40 minutes.  Past medical history is significant for hypertension, hyperlipidemia, CHF with EF 30 to 35% on GDMT, DM, and morbid obesity, arthritis, ADD, OSA on CPAP, SS/back pain, DM2.      SLP Plan  Continue with current plan of care      Recommendations for follow up therapy are one component of a multi-disciplinary discharge planning process, led by the attending physician.  Recommendations may be updated based on patient status, additional functional criteria and insurance authorization.    Recommendations                   Rehab consult     Intermittent Supervision/Assistance Cognitive communication  deficit (U72.536)     Continue with current plan of care     Royce Macadamia  12/12/2022, 12:40 PM

## 2022-12-12 NOTE — Progress Notes (Signed)
This encounter was created in error - please disregard.  I called patient and left a detailed message to let her know that I would call back.  When I called back patient, she informed me that she had a heart attack and have been in the hospital for a few days now and that she had forgotten about this appointment.  Patient will call office to reschedule when she gets home.

## 2022-12-12 NOTE — Care Management Important Message (Signed)
Important Message  Patient Details  Name: [name] MRN: [id number] Date of Birth: 04/03/1965   Important Message Given:  Yes - Medicare IM     Dorena Bodo 12/12/2022, 1:53 PM

## 2022-12-12 NOTE — Progress Notes (Signed)
Physical Therapy Treatment Patient Details Name: [name] MRN: [id number] DOB: [birth year] Today's Date: 12/12/2022   History of Present Illness 57 year old female admitted 10/10 after suffering cardiac arrest + bystander CPR and eventual ROSC after 40 minutes. R/L heart cath and coronary angiography 10/14 w/ diagnosis of NSTEMI.  Past medical history is significant for hypertension, hyperlipidemia, CHF with EF 30 to 35% on GDMT, DM, and morbid obesity, arthritis, ADD, OSA on CPAP, SS/back pain, DM2.    PT Comments  Pt admitted with above diagnosis. Pt was able to ambulate with rollator and incr distance today with CGA.  Pt is progressing and doing better each day.  Still needs some assist for safety.  Pt currently with functional limitations due to the deficits listed below (see PT Problem List). Pt will benefit from acute skilled PT to increase their independence and safety with mobility to allow discharge.       If plan is discharge home, recommend the following: A little help with walking and/or transfers;A little help with bathing/dressing/bathroom;Help with stairs or ramp for entrance   Can travel by private vehicle        Equipment Recommendations  None recommended by PT    Recommendations for Other Services       Precautions / Restrictions Precautions Precautions: Fall;Other (comment) (R radial cath 10/14) Precaution Comments: no lift/pull/push >5# from 10/14 to 10/18 Restrictions Weight Bearing Restrictions: No RUE Weight Bearing: Weight bearing as tolerated     Mobility  Bed Mobility Overal bed mobility: Needs Assistance Bed Mobility: Supine to Sit     Supine to sit: HOB elevated, Min assist     General bed mobility comments: MinA to complete moving LE off EOB and for trunk rise. Cues to push with L UE to adhere to precautions.    Transfers Overall transfer level: Needs assistance Equipment used: Rollator (4 wheels) Transfers: Sit to/from Stand Sit to Stand:  Min assist           General transfer comment: MinA for initial sit to stand for initial rise,  Cues for hand placement and adherance to UE precautions.    Ambulation/Gait Ambulation/Gait assistance: Contact guard assist Gait Distance (Feet): 35 Feet Assistive device: Rollator (4 wheels) Gait Pattern/deviations: Step-through pattern, Shuffle, Decreased stride length, Wide base of support Gait velocity: dec Gait velocity interpretation: <1.31 ft/sec, indicative of household ambulator   General Gait Details: very slow gait speed with increased medial/lateral sway likely due to body habitus. Pt with good balance with CGA.  Pt recalls how to lock and unlock brakes from prior to admission.   Stairs             Wheelchair Mobility     Tilt Bed    Modified Rankin (Stroke Patients Only)       Balance Overall balance assessment: Needs assistance Sitting-balance support: No upper extremity supported, Feet supported Sitting balance-Leahy Scale: Fair Sitting balance - Comments: CGA approaching supervision static sitting EOB   Standing balance support: Bilateral upper extremity supported, During functional activity, Reliant on assistive device for balance Standing balance-Leahy Scale: Poor Standing balance comment: reliant on RW                            Cognition Arousal: Alert Behavior During Therapy: WFL for tasks assessed/performed Overall Cognitive Status: Within Functional Limits for tasks assessed Area of Impairment: Following commands, Memory  Orientation Level: Disoriented to, Time (alert to month and year) Current Attention Level: Focused, Sustained (highly distractible) Memory: Decreased recall of precautions Following Commands: Follows one step commands consistently, Follows one step commands with increased time Safety/Judgement: Decreased awareness of safety Awareness: Intellectual, Emergent Problem Solving: Slow  processing, Requires verbal cues General Comments: pt benefits from one step commands with increased time to process. Repeated cues for sequencing with mobility. Pt mentioned having difficulty with memory while in the hospital and being confused.        Exercises General Exercises - Upper Extremity Shoulder Flexion: AAROM, 5 reps, Both, Supine Elbow Extension: AROM, Both, 5 reps, Supine General Exercises - Lower Extremity Ankle Circles/Pumps: AROM, 10 reps, Both, Supine Quad Sets: AROM, 5 reps, Left, Supine Heel Slides: AAROM, Both, Right, Left, 5 reps, Other (comment) (3 reps on L due to pain)    General Comments General comments (skin integrity, edema, etc.): Pt on 4LO2 on arrival. Removed and pt did not desat below 91% on RA at rest. With activity, desaturated to 87% therefore replaced 2LO2.  Other VSS.      Pertinent Vitals/Pain Pain Assessment Pain Assessment: Faces Faces Pain Scale: Hurts even more Pain Location: chest when moving, left LE Pain Descriptors / Indicators: Guarding Pain Intervention(s): Limited activity within patient's tolerance, Monitored during session, Repositioned    Home Living                          Prior Function            PT Goals (current goals can now be found in the care plan section) Acute Rehab PT Goals Patient Stated Goal: to get stronger, go home Progress towards PT goals: Progressing toward goals    Frequency    Min 1X/week      PT Plan      Co-evaluation              AM-PAC PT "6 Clicks" Mobility   Outcome Measure  Help needed turning from your back to your side while in a flat bed without using bedrails?: A Little Help needed moving from lying on your back to sitting on the side of a flat bed without using bedrails?: A Lot Help needed moving to and from a bed to a chair (including a wheelchair)?: A Little Help needed standing up from a chair using your arms (e.g., wheelchair or bedside chair)?: A  Little Help needed to walk in hospital room?: A Little Help needed climbing 3-5 steps with a railing? : Total 6 Click Score: 15    End of Session Equipment Utilized During Treatment: Oxygen;Gait belt Activity Tolerance: Patient tolerated treatment well Patient left: in chair;with call bell/phone within reach;with chair alarm set Nurse Communication: Mobility status PT Visit Diagnosis: Muscle weakness (generalized) (M62.81);Other abnormalities of gait and mobility (R26.89) Pain - Right/Left: Left Pain - part of body: Knee (& chest)     Time: 1035-1050 PT Time Calculation (min) (ACUTE ONLY): 15 min  Charges:    $Gait Training: 8-22 mins PT General Charges $$ ACUTE PT VISIT: 1 Visit                     Sinclair Alligood M,PT Acute Rehab Services [phone]    Bevelyn Buckles 12/12/2022, 1:24 PM

## 2022-12-12 NOTE — Progress Notes (Signed)
Progress Note  Patient Name: [name] Date of Encounter: 12/12/2022  Primary Cardiologist:   [name], MD   Subjective   She is breathing OK.  No palpitations.   She has pain with her leg wound and some soreness with her chest post CPR.    Inpatient Medications    Scheduled Meds:  atorvastatin  80 mg Oral Daily   carvedilol  6.25 mg Oral BID WC   Chlorhexidine Gluconate Cloth  6 each Topical Daily   dapagliflozin propanediol  10 mg Oral Daily   heparin injection (subcutaneous)  5,000 Units Subcutaneous Q8H   insulin aspart  0-20 Units Subcutaneous TID WC   losartan  50 mg Oral Daily   metoprolol succinate  50 mg Oral Daily   mouth rinse  15 mL Mouth Rinse 4 times per day   sodium chloride flush  10 mL Intravenous Q12H   sodium chloride flush  3 mL Intravenous Q12H   Continuous Infusions:  cefTRIAXone (ROCEPHIN)  IV 2 g (12/11/22 0833)   PRN Meds: acetaminophen **OR** acetaminophen (TYLENOL) oral liquid 160 mg/5 mL **OR** acetaminophen, ALPRAZolam, fentaNYL (SUBLIMAZE) injection, hydrOXYzine, mouth rinse, oxyCODONE, sodium chloride flush   Vital Signs    Vitals:   12/11/22 1939 12/11/22 2311 12/12/22 0005 12/12/22 0333  BP: 117/74 134/80  125/68  Pulse: 84 81  75  Resp: 19 14 17 12   Temp: 98.6 F (37 C) 98.5 F (36.9 C)  98.4 F (36.9 C)  TempSrc: Oral Oral  Oral  SpO2: 98% 97% 98% 99%  Weight:    (!) 140.5 kg  Height:        Intake/Output Summary (Last 24 hours) at 12/12/2022 0744 Last data filed at 12/12/2022 4034 Gross per 24 hour  Intake 834 ml  Output 1450 ml  Net -616 ml   Filed Weights   12/10/22 1419 12/11/22 0456 12/12/22 0333  Weight: (!) 141.1 kg (!) 142.1 kg (!) 140.5 kg    Telemetry  NSR, rare ventricular ectopy    Personally Reviewed  ECG    NA - Personally Reviewed  Physical Exam   GEN: No  acute distress.   Neck: No  JVD Cardiac: RRR, no murmurs, rubs, or gallops.  Respiratory: Clear   to auscultation  bilaterally. GI: Soft, nontender, non-distended, normal bowel sounds  MS:  No edema; No deformity.  Wound in left leg with no evidence of purulence.   Neuro:   Nonfocal  Psych: Oriented and appropriate     Labs    Chemistry Recent Labs  Lab 12/08/22 0509 12/09/22 0311 12/09/22 1737 12/10/22 0227 12/11/22 0342 12/12/22 0305  NA 132* 137   < > 136 138 141  K 3.6 3.8   < > 4.0 3.9 3.7  CL 102 103  --  104 104 108  CO2 21* 23  --  23 26 23   GLUCOSE 121* 119*  --  108* 136* 126*  BUN 29* 22*  --  20 18 17   CREATININE 1.73* 1.57*  --  1.32* 1.26* 1.25*  CALCIUM 7.5* 8.3*  --  8.3* 8.8* 8.7*  PROT 5.7* 5.8*  --  5.5*  --   --   ALBUMIN 2.5* 2.5*  --  2.4*  --   --   AST 41 29  --  22  --   --   ALT 83* 63*  --  45*  --   --   ALKPHOS 32* 33*  --  31*  --   --  BILITOT 0.8 0.9  --  0.8  --   --   GFRNONAA 34* 38*  --  47* 50* 50*  ANIONGAP 9 11  --  9 8 10    < > = values in this interval not displayed.     Hematology Recent Labs  Lab 12/10/22 0227 12/11/22 0342 12/12/22 0305  WBC 7.6 6.9 7.0  RBC 4.54 4.54 4.48  HGB 12.8 12.6 12.9  HCT 39.1 38.7 38.7  MCV 86.1 85.2 86.4  MCH 28.2 27.8 28.8  MCHC 32.7 32.6 33.3  RDW 14.8 14.7 14.7  PLT 151 185 192    Cardiac EnzymesNo results for input(s): "TROPONINI" in the last 168 hours. No results for input(s): "TROPIPOC" in the last 168 hours.   BNPNo results for input(s): "BNP", "PROBNP" in the last 168 hours.   DDimer No results for input(s): "DDIMER" in the last 168 hours.   Radiology    No results found.  Cardiac Studies   Cath above:  NL coronaries.   Right heart pressures OK.    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  Now status post cardiac arrest.    Assessment & Plan    Cardiac arrest:  Stopped amiodarone.  Right and left heart cath with results as above.   Likely primary arrhythmic .    ICD possibly Friday.   Nonischemic cardiomyopathy:  Previous class I symptoms.. She has  not had significant LVH  or evidence of other etiology.   Was initially thought to be post partum. MRI today to exclude infiltrative etiology prior to getting an MRI.  Of note we found out that the patient has not been taking her BID meds twice daily.  Therefore we are going to switch to Toprol and Cozaar.  I will follow as an out patient to get her back to GDMT.   AKI:  Creat appearst to be at baseline.   Leg wounds:  Dressing by wound nurse.    ADHD:  This is her biggest concern.  She has severe QOL limiting symptoms.  She needs medical therapy.  She was on Adderall and would not want to take this again.  She was waiting for approval of Ritalin.  I discussed that there is a small but increased risk of arrhythmias with stimulants but the risk benefit on this patient has been acceptable.  I think she should continue to have expert driven med adjustment particularly now that she will have an ICD.  I will ask pharmacy to see if we can facilitate approval of the Ritalin.    Other:  PT ambulated in the hallway and she will be able to be discharged home.  She will need wound clinic care as well.     For questions or updates, please contact [facility] Please consult [email or web address] for contact info under Cardiology/STEMI.   Signed, [name], MD  12/12/2022, 7:44 AM

## 2022-12-12 NOTE — Progress Notes (Signed)
Mobility Specialist Progress Note:    12/12/22 1116  Mobility  Activity Ambulated with assistance in hallway  Level of Assistance Contact guard assist, steadying assist  Assistive Device Four wheel walker  Distance Ambulated (ft) 35 ft  Activity Response Tolerated well  Mobility Referral Yes  $Mobility charge 1 Mobility  Mobility Specialist Start Time (ACUTE ONLY) 1051  Mobility Specialist Stop Time (ACUTE ONLY) 1101  Mobility Specialist Time Calculation (min) (ACUTE ONLY) 10 min   Received pt at end of Pt session. Was able to ambulate back to her recliner w/o fault. Ambulated on 2L/min, VSS. C/o chest pain throughout session, otherwise no c/o. Situated on chair w/ call bell and personal belongings in reach. Chair alarm on and all needs met.   Thompson Grayer Mobility Specialist  Please contact vis Secure Chat or  Rehab Office [phone]

## 2022-12-12 NOTE — Plan of Care (Signed)
  Problem: Education: Goal: Individualized Educational Video(s) Outcome: Progressing   Problem: Coping: Goal: Ability to adjust to condition or change in health will improve Outcome: Progressing   Problem: Fluid Volume: Goal: Ability to maintain a balanced intake and output will improve Outcome: Progressing

## 2022-12-12 NOTE — Progress Notes (Signed)
Inpatient Rehab Admissions Coordinator:  Saw pt at bedside. Awaiting medical clearance and bed availability. Will continue to follow.   Wolfgang Phoenix, MS, CCC-SLP Admissions Coordinator [phone]

## 2022-12-13 ENCOUNTER — Other Ambulatory Visit (HOSPITAL_COMMUNITY): Payer: Self-pay

## 2022-12-13 DIAGNOSIS — I4901 Ventricular fibrillation: Secondary | ICD-10-CM | POA: Diagnosis not present

## 2022-12-13 DIAGNOSIS — I469 Cardiac arrest, cause unspecified: Secondary | ICD-10-CM | POA: Diagnosis not present

## 2022-12-13 LAB — BASIC METABOLIC PANEL
Anion gap: 10 (ref 5–15)
BUN: 17 mg/dL (ref 6–20)
CO2: 24 mmol/L (ref 22–32)
Calcium: 9.2 mg/dL (ref 8.9–10.3)
Chloride: 104 mmol/L (ref 98–111)
Creatinine, Ser: 1.06 mg/dL — ABNORMAL HIGH (ref 0.44–1.00)
GFR, Estimated: >60 mL/min (ref >60)
Glucose, Bld: 125 mg/dL — ABNORMAL HIGH (ref 70–99)
Potassium: 4 mmol/L (ref 3.5–5.1)
Sodium: 138 mmol/L (ref 135–145)

## 2022-12-13 LAB — GLUCOSE, CAPILLARY
Glucose-Capillary: 113 mg/dL — ABNORMAL HIGH (ref 70–99)
Glucose-Capillary: 130 mg/dL — ABNORMAL HIGH (ref 70–99)
Glucose-Capillary: 99 mg/dL (ref 70–99)
Glucose-Capillary: 105 mg/dL — ABNORMAL HIGH (ref 70–99)

## 2022-12-13 LAB — CBC
HCT: 37.8 % (ref 36.0–46.0)
Hemoglobin: 12.4 g/dL (ref 12.0–15.0)
MCH: 28.7 pg (ref 26.0–34.0)
MCHC: 32.8 g/dL (ref 30.0–36.0)
MCV: 87.5 fL (ref 80.0–100.0)
Platelets: 220 10*3/uL (ref 150–400)
RBC: 4.32 MIL/uL (ref 3.87–5.11)
RDW: 14.9 % (ref 11.5–15.5)
WBC: 8.1 10*3/uL (ref 4.0–10.5)
nRBC: 0 % (ref 0.0–0.2)

## 2022-12-13 MED ORDER — POLYETHYLENE GLYCOL 3350 17 G PO PACK
17.0000 g | PACK | Freq: Every day | ORAL | Status: DC | PRN
  Administered 2022-12-13: 17 g via ORAL
  Filled 2022-12-13: qty 1

## 2022-12-13 NOTE — Plan of Care (Signed)
[facility] Tonsillectomy/Adenoidectomy, Postoperative PEDS care plan entered in error.

## 2022-12-13 NOTE — Progress Notes (Signed)
Mobility Specialist Progress Note:    12/13/22 1509  Mobility  Activity Transferred from bed to chair  Level of Assistance Minimal assist, patient does 75% or more  Assistive Device Front wheel walker  Distance Ambulated (ft) 3 ft  Activity Response Tolerated well  Mobility Referral Yes  $Mobility charge 1 Mobility  Mobility Specialist Start Time (ACUTE ONLY) 1500  Mobility Specialist Stop Time (ACUTE ONLY) 1509  Mobility Specialist Time Calculation (min) (ACUTE ONLY) 9 min   Pt received in bed, requesting assistance to transfer B>C. SBA while pt sat EOB, MinA to stand and pivot with RW. Tolerated well, asx throughout. Left pt in chair for wound care with RN and NT in room. All needs met, chair alarm on.   Feliciana Rossetti Mobility Specialist Please contact via Special educational needs teacher or  Rehab office at [phone]

## 2022-12-13 NOTE — Consult Note (Signed)
Value-Based Care Institute  [facility] Inpatient Consult   12/13/2022  Kylie Halbrooks 1965/12/13 [id number]   [facility] [[facility]]  Accountable Care Organization [ACO] Patient: Medicare ACO REACH  Primary Care Provider: [name], MD, with [place] at [facility], this provider is listed for the transition of care follow up appointments and calls   [facility] Liaison discussed patient and disposition in unit progression meeting.     The patient was screened for 7 day LOS readmission hospitalization with noted low risk score for unplanned readmission risk in 6 months. Patient has been active in the past with [facility] Coordinators for support with depression follow up.  The patient was assessed for potential [facility]) Care Management service needs for post hospital transition for care coordination. Review of patient's electronic medical record reveals patient is being recommended for an inpatient rehabilitation transition at [facility] facility..   Plan: [facility] Liaison will continue to follow progress and disposition to asess for post hospital community care coordination/management needs.  Referral request for community care coordination: pending disposition.  Anticipate community TOC team to follow up pending disposition.   Community Care Management/Population Health does not replace or interfere with any arrangements made by the Inpatient Transition of Care team.   For questions contact:   Charlesetta Shanks, RN, BSN, CCM [place]  [facility], [facility] Liaison Direct Dial: [phone] or secure chat Website: [email or web address]         .   [name]

## 2022-12-13 NOTE — Plan of Care (Signed)
  Problem: Education: Goal: Ability to describe self-care measures that may prevent or decrease complications (Diabetes Survival Skills Education) will improve Outcome: Progressing Goal: Individualized Educational Video(s) Outcome: Progressing   Problem: Coping: Goal: Ability to adjust to condition or change in health will improve Outcome: Progressing   Problem: Fluid Volume: Goal: Ability to maintain a balanced intake and output will improve Outcome: Progressing   Problem: Health Behavior/Discharge Planning: Goal: Ability to identify and utilize available resources and services will improve Outcome: Progressing Goal: Ability to manage health-related needs will improve Outcome: Progressing   Problem: Metabolic: Goal: Ability to maintain appropriate glucose levels will improve Outcome: Progressing   Problem: Nutritional: Goal: Maintenance of adequate nutrition will improve Outcome: Progressing Goal: Progress toward achieving an optimal weight will improve Outcome: Progressing   Problem: Skin Integrity: Goal: Risk for impaired skin integrity will decrease Outcome: Progressing   Problem: Tissue Perfusion: Goal: Adequacy of tissue perfusion will improve Outcome: Progressing   Problem: Education: Goal: Knowledge of General Education information will improve Description: Including pain rating scale, medication(s)/side effects and non-pharmacologic comfort measures Outcome: Progressing   Problem: Health Behavior/Discharge Planning: Goal: Ability to manage health-related needs will improve Outcome: Progressing   Problem: Clinical Measurements: Goal: Ability to maintain clinical measurements within normal limits will improve Outcome: Progressing Goal: Will remain free from infection Outcome: Progressing Goal: Diagnostic test results will improve Outcome: Progressing Goal: Respiratory complications will improve Outcome: Progressing Goal: Cardiovascular complication will  be avoided Outcome: Progressing   Problem: Activity: Goal: Risk for activity intolerance will decrease Outcome: Progressing   Problem: Nutrition: Goal: Adequate nutrition will be maintained Outcome: Progressing   Problem: Coping: Goal: Level of anxiety will decrease Outcome: Progressing   Problem: Elimination: Goal: Will not experience complications related to bowel motility Outcome: Progressing Goal: Will not experience complications related to urinary retention Outcome: Progressing   Problem: Pain Managment: Goal: General experience of comfort will improve Outcome: Progressing   Problem: Safety: Goal: Ability to remain free from injury will improve Outcome: Progressing   Problem: Skin Integrity: Goal: Risk for impaired skin integrity will decrease Outcome: Progressing   Problem: Education: Goal: Understanding of CV disease, CV risk reduction, and recovery process will improve Outcome: Progressing Goal: Individualized Educational Video(s) Outcome: Progressing   Problem: Activity: Goal: Ability to return to baseline activity level will improve Outcome: Progressing   Problem: Cardiovascular: Goal: Ability to achieve and maintain adequate cardiovascular perfusion will improve Outcome: Progressing Goal: Vascular access site(s) Level 0-1 will be maintained Outcome: Progressing   Problem: Health Behavior/Discharge Planning: Goal: Ability to safely manage health-related needs after discharge will improve Outcome: Progressing   Problem: Education: Goal: Understanding of post-operative needs will improve Outcome: Progressing Goal: Individualized Educational Video(s) Outcome: Progressing   Problem: Clinical Measurements: Goal: Postoperative complications will be avoided or minimized Outcome: Progressing   Problem: Respiratory: Goal: Will regain and/or maintain adequate ventilation Outcome: Progressing

## 2022-12-13 NOTE — Progress Notes (Signed)
   12/13/22 0018  BiPAP/CPAP/SIPAP  $ Non-Invasive Home Ventilator  Subsequent  BiPAP/CPAP/SIPAP Pt Type Adult  BiPAP/CPAP/SIPAP Resmed  Pressure Support 10 cmH20  PEEP 8 cmH20  Patient Home Equipment No

## 2022-12-13 NOTE — Plan of Care (Signed)
  RD consulted for nutrition education regarding diabetes.   Lab Results  Component Value Date   HGBA1C 6.6 (H) 12/07/2022    RD provided "Plate Method" and "Carbohydrate Counting for People with Diabetes" handout from the Academy of Nutrition and Dietetics.   Obtained dietary recall from pt. She mentions that she previously followed the "Ronnell [place] Diet" which consists of 5 small meals daily including a protein/carb/vegetable. She no longer follows this meal pattern however consumes 2-3 meals per day and tries to include a protein/vegetable and carb with each meal. When she consumes breakfast she likes to eat french toast, cereal or a banana. Pt has not consumed soda x5 years. She occasionally enjoys ice cream for dessert.   Discussed different food groups and their effects on blood sugar, emphasizing carbohydrate-containing foods. Provided list of carbohydrates and recommended serving sizes of common foods.  Discussed importance of controlled and consistent carbohydrate intake throughout the day. Provided examples of ways to balance meals/snacks and encouraged intake of high-fiber, whole grain complex carbohydrates. Teach back method used.  Expect good compliance. Pt is interested in ongoing outpatient diet education. Looking into classes at [facility]. Informed her referral placed for outpatient nutrition and education center.   Body mass index is 51.73 kg/m. Pt meets criteria for morbid obesity based on current BMI.  Current diet order is NPO for procedure, patient was consuming approximately 100% of meals on heart healthy/carb modified. Labs and medications reviewed. No further nutrition interventions warranted at this time. RD contact information provided. If additional nutrition issues arise, please re-consult RD.  Drusilla Kanner, RDN, LDN Clinical Nutrition

## 2022-12-13 NOTE — TOC Progression Note (Signed)
Transition of Care [facility]) - Progression Note    Patient Details  Name: [name] MRN: [id number] Date of Birth: 1965-03-20  [facility]) CM/SW Contact  Leone Haven, RN Phone Number: 12/13/2022, 11:07 AM  Clinical Narrative:    NCM received call from [facility] with [employer] Life Vest, NCM scanned information to her.     Expected Discharge Plan: Home w Home Health Services Barriers to Discharge: Continued Medical Work up  Expected Discharge Plan and Services In-house Referral: NA Discharge Planning Services: CM Consult Post Acute Care Choice: NA Living arrangements for the past 2 months: Single Family Home                 DME Arranged: N/A DME Agency: NA       HH Arranged: NA           Social Determinants of Health (SDOH) Interventions SDOH Screenings   Food Insecurity: No Food Insecurity (12/06/2022)  Housing: Patient Unable To Answer (12/11/2022)  Transportation Needs: No Transportation Needs (12/11/2022)  Utilities: Not At Risk (12/06/2022)  Alcohol Screen: Low Risk  (12/11/2022)  Depression (PHQ2-9): High Risk (12/05/2022)  Financial Resource Strain: Low Risk  (12/11/2022)  Physical Activity: Inactive (11/12/2022)  Social Connections: Socially Isolated (08/08/2021)  Stress: Stress Concern Present (11/12/2022)  Tobacco Use: Medium Risk (12/06/2022)    Readmission Risk Interventions     No data to display

## 2022-12-13 NOTE — Progress Notes (Addendum)
Rounding Note    Patient Name: [name] Date of Encounter: 12/13/2022  [facility] Cardiologist: [name], MD   Subjective   NAEO, no new complaints, remains with chest wall and [name] discomfort  Inpatient Medications    Scheduled Meds:  atorvastatin  80 mg Oral Daily   Chlorhexidine Gluconate Cloth  6 each Topical Daily   dapagliflozin propanediol  10 mg Oral Daily   heparin injection (subcutaneous)  5,000 Units Subcutaneous Q8H   insulin aspart  0-20 Units Subcutaneous TID WC   losartan  50 mg Oral Daily   metoprolol succinate  50 mg Oral Daily   mouth rinse  15 mL Mouth Rinse 4 times per day   sodium chloride flush  10 mL Intravenous Q12H   sodium chloride flush  3 mL Intravenous Q12H   spironolactone  12.5 mg Oral Daily   Continuous Infusions:   PRN Meds: acetaminophen **OR** acetaminophen (TYLENOL) oral liquid 160 mg/5 mL **OR** acetaminophen, ALPRAZolam, fentaNYL (SUBLIMAZE) injection, hydrOXYzine, mouth rinse, oxyCODONE, sodium chloride flush   Vital Signs    Vitals:   12/12/22 2103 12/12/22 2352 12/13/22 0531 12/13/22 0819  BP: 104/88 122/85 120/72 126/81  Pulse: 82 80 73   Resp: 15 17 17 17   Temp: 98.3 F (36.8 C) 98.8 F (37.1 C) 98.4 F (36.9 C) 98.2 F (36.8 C)  TempSrc: Oral Oral Oral Oral  SpO2: 100% 98% 99% 96%  Weight:   (!) 136.7 kg   Height:        Intake/Output Summary (Last 24 hours) at 12/13/2022 1131 Last data filed at 12/13/2022 0531 Gross per 24 hour  Intake 240 ml  Output 1250 ml  Net -1010 ml      12/13/2022    5:31 AM 12/12/2022    1:01 PM 12/12/2022    3:33 AM  Last 3 Weights  Weight (lbs) 301 lb 5.9 oz -- 309 lb 11.9 oz  Weight (kg) 136.7 kg -- 140.5 kg      Telemetry    SR, rare PVCs - Personally Reviewed  ECG    No new EKGs - Personally Reviewed  Physical Exam    Unchanged exam GEN: No acute distress.   Neck: No JVD Cardiac: RRR, no murmurs, rubs, or gallops.  Respiratory: CTA b/l  (ant and lat auscultation only) GI: Soft, nontender, non-distended  MS: No edema; No deformity.  [name] dressing is some evidence of drainage, not completely saturated Neuro:  Nonfocal  Psych: Normal affect   Labs    High Sensitivity Troponin:   Recent Labs  Lab 12/06/22 0009 12/06/22 0317 12/06/22 1044 12/06/22 1321 12/06/22 1601  TROPONINIHS 2,849* 3,234* 1,025* 809* 636*     Chemistry Recent Labs  Lab 12/08/22 0509 12/09/22 0311 12/09/22 1737 12/10/22 0227 12/11/22 0342 12/12/22 0305 12/13/22 0500  NA 132* 137   < > 136 138 141 138  K 3.6 3.8   < > 4.0 3.9 3.7 4.0  CL 102 103  --  104 104 108 104  CO2 21* 23  --  23 26 23 24   GLUCOSE 121* 119*  --  108* 136* 126* 125*  BUN 29* 22*  --  20 18 17 17   CREATININE 1.73* 1.57*  --  1.32* 1.26* 1.25* 1.06*  CALCIUM 7.5* 8.3*  --  8.3* 8.8* 8.7* 9.2  MG 1.8 1.9  --  2.4  --   --   --   PROT 5.7* 5.8*  --  5.5*  --   --   --  ALBUMIN 2.5* 2.5*  --  2.4*  --   --   --   AST 41 29  --  22  --   --   --   ALT 83* 63*  --  45*  --   --   --   ALKPHOS 32* 33*  --  31*  --   --   --   BILITOT 0.8 0.9  --  0.8  --   --   --   GFRNONAA 34* 38*  --  47* 50* 50* >60  ANIONGAP 9 11  --  9 8 10 10    < > = values in this interval not displayed.    Lipids  No results for input(s): "CHOL", "TRIG", "HDL", "LABVLDL", "LDLCALC", "CHOLHDL" in the last 168 hours.   Hematology Recent Labs  Lab 12/11/22 0342 12/12/22 0305 12/13/22 0500  WBC 6.9 7.0 8.1  RBC 4.54 4.48 4.32  HGB 12.6 12.9 12.4  HCT 38.7 38.7 37.8  MCV 85.2 86.4 87.5  MCH 27.8 28.8 28.7  MCHC 32.6 33.3 32.8  RDW 14.7 14.7 14.9  PLT 185 192 220   Thyroid No results for input(s): "TSH", "FREET4" in the last 168 hours.  BNPNo results for input(s): "BNP", "PROBNP" in the last 168 hours.  DDimer No results for input(s): "DDIMER" in the last 168 hours.   Radiology    DG CHEST PORT 1 VIEW Result Date: 12/10/2022 CLINICAL DATA:  Pneumonia EXAM: PORTABLE CHEST 1 VIEW  COMPARISON:  12/06/2022 FINDINGS: Patient is rotated. Endotracheal and enteric tubes have been removed. Left subclavian central line remains in place. Cardiac silhouette remains mildly enlarged. Improving aeration of the lung bases with mild residual atelectasis. No pleural effusion or pneumothorax. IMPRESSION: Improving aeration of the lung bases with mild residual atelectasis. Electronically Signed   By: Duanne Guess D.O.   On: 12/10/2022 10:28    Cardiac Studies   C.MRI Done/pending read   12/09/22: R/LHC Right & Left Heart Catheterization 12/09/22: Hemodynamic data: LV: 119/7, EDP 17 mmHg.  Ao 110/78, mean 92 mmHg.  No pressure gradient across the aortic valve. RA: 15/13, mean 8 mmHg. RV 47/6, EDP 17 mmHg. PA 46/21, mean 31 mmHg.  PA saturation 66%. PW 18/19, mean 19 mmHg.  AO saturation 92%. CO 6.69, CI 2.83, preserved and normal.  QP/QS 1.00.   Angiographic data: RCA: Dominant, has anterior origin, small to normal. LM: Has superior takeoff, a 5 [place] 3.0 guide catheter was utilized to engage.  Normal left main. LAD: Large-caliber vessel giving origin to 2 large diagonals, smooth and normal. RI: Moderate caliber vessel, smooth and normal. LCx: Large vessel giving origin to large OM1 which has secondary branches and continues in the AV groove is a moderate-sized vessel and gives origin to OM 2 from the distal end.  Smooth and normal.  01/07/23: TTE 1. Left ventricular ejection fraction, by estimation, is 30 to 35%. The  left ventricle has moderately decreased function. The left ventricle  demonstrates global hypokinesis. There is mild left ventricular  hypertrophy. Indeterminate diastolic filling due   to E-A fusion.   2. Right ventricular systolic function is normal. The right ventricular  size is normal.   3. The mitral valve is normal in structure. Trivial mitral valve  regurgitation.   4. The aortic valve is tricuspid. Aortic valve regurgitation is not   visualized.   5. The inferior vena cava IVC not well visualized.   Comparison(s): No significant change from prior study.  07/10/21: TTE 1. Left ventricular ejection fraction, by estimation, is 30 to 35%. Left  ventricular ejection fraction by 3D volume is 32 %. The left ventricle has  moderately decreased function. The left ventricle demonstrates global  hypokinesis. The left ventricular  internal cavity size was moderately dilated. Left ventricular diastolic  parameters are consistent with Grade I diastolic dysfunction (impaired  relaxation).   2. Right ventricular systolic function is normal. The right ventricular  size is normal. Tricuspid regurgitation signal is inadequate for assessing  PA pressure.   3. The mitral valve is grossly normal. Trivial mitral valve  regurgitation.   4. The aortic valve is tricuspid. Aortic valve regurgitation is not  visualized.   5. The inferior vena cava is normal in size with <50% respiratory  variability, suggesting right atrial pressure of 8 mmHg.   Comparison(s): 12/30/18 EF 50-55%.   Patient Profile     57 y.o. female w/PMHx of chronic CHF (EF waxes/wanes 30's-50's), depression (that is described as severe), HTN, obesity, DM, OSA admitted with OOH cardiac arrest  Assessment & Plan    OOH cardiac arrest NICM The pt reports this goes back 20 years and at least initially a post partum CM   She Sueko Dimichele need an ICD  Dr. [name] re-discussed rational for ICD and the procedure Central line pulled on 12/10/22  Her LE wound is worrisome and gives her a marked increase in infection risk  Uncertain though I don't think her body habitus precludes life vest, discussed with rep, I have ordered life vest, they did not get her paperwork until this morning apparently, once processed and approved they Liliahna Cudd expedite evaluating/fitting her.  Dr [name] discussed with the patient and her husband, if her body habitus Aidynn Polendo not allow life vest we Yesennia Hirota need  to pursue implant understanding increase infection risk, they understand  NPO for now   For questions or updates, please contact [facility] Please consult [email or web address] for contact info under        Signed, Sheilah Pigeon, PA-C  12/13/2022, 11:31 AM    I have seen and examined this patient with Francis Dowse.  Agree with above, note added to reflect my findings.  Feeling well, only complaint is continued chest wall and left lower extremity pain.  GEN: Well nourished, well developed, in no acute distress  HEENT: normal  Neck: no JVD, carotid bruits, or masses Cardiac: RRR; no murmurs, rubs, or gallops,no edema  Respiratory:  clear to auscultation bilaterally, normal work of breathing GI: soft, nontender, nondistended, + BS MS: no deformity or atrophy  Skin: warm and dry, left lower extremity wrapped Neuro:  Strength and sensation are intact Psych: euthymic mood, full affect   Out-of-hospital cardiac arrest Chronic systolic heart failure due to nonischemic cardiomyopathy Patient Lane Eland need ICD.  Due to her lower extremity wounds, we Twanisha Foulk see if she is a candidate for LifeVest.  If she is, would allow for discharge once LifeVest has been fitted, and she can follow-up as an outpatient for ICD therapy.  If LifeVest does not fit, we Rohan Juenger need to discuss with the patient the risk of infection and likely plan for ICD implant during this hospitalization.  Shonn Farruggia M. Aydn Ferrara MD 12/13/2022 3:08 PM

## 2022-12-13 NOTE — Plan of Care (Signed)
  Problem: Coping: Goal: Ability to adjust to condition or change in health will improve Outcome: Progressing   Problem: Health Behavior/Discharge Planning: Goal: Ability to identify and utilize available resources and services will improve Outcome: Progressing   Problem: Health Behavior/Discharge Planning: Goal: Ability to manage health-related needs will improve Outcome: Progressing   Problem: Metabolic: Goal: Ability to maintain appropriate glucose levels will improve Outcome: Progressing

## 2022-12-13 NOTE — Progress Notes (Signed)
Progress Note  Patient Name: [name] Date of Encounter: 12/13/2022  Primary Cardiologist:   [name], MD   Subjective   She continues to have leg pain and chest soreness.    Inpatient Medications    Scheduled Meds:  atorvastatin  80 mg Oral Daily   Chlorhexidine Gluconate Cloth  6 each Topical Daily   dapagliflozin propanediol  10 mg Oral Daily   heparin injection (subcutaneous)  5,000 Units Subcutaneous Q8H   insulin aspart  0-20 Units Subcutaneous TID WC   losartan  50 mg Oral Daily   metoprolol succinate  50 mg Oral Daily   mouth rinse  15 mL Mouth Rinse 4 times per day   sodium chloride flush  10 mL Intravenous Q12H   sodium chloride flush  3 mL Intravenous Q12H   spironolactone  12.5 mg Oral Daily   Continuous Infusions:  cefTRIAXone (ROCEPHIN)  IV 2 g (12/12/22 1007)   PRN Meds: acetaminophen **OR** acetaminophen (TYLENOL) oral liquid 160 mg/5 mL **OR** acetaminophen, ALPRAZolam, fentaNYL (SUBLIMAZE) injection, hydrOXYzine, mouth rinse, oxyCODONE, sodium chloride flush   Vital Signs    Vitals:   12/12/22 1533 12/12/22 2103 12/12/22 2352 12/13/22 0531  BP: (!) 149/87 104/88 122/85 120/72  Pulse: 86 82 80 73  Resp: 16 15 17 17   Temp: 98.4 F (36.9 C) 98.3 F (36.8 C) 98.8 F (37.1 C) 98.4 F (36.9 C)  TempSrc: Oral Oral Oral Oral  SpO2: 99% 100% 98% 99%  Weight:    (!) 136.7 kg  Height:        Intake/Output Summary (Last 24 hours) at 12/13/2022 0817 Last data filed at 12/13/2022 0531 Gross per 24 hour  Intake 250 ml  Output 1250 ml  Net -1000 ml   Filed Weights   12/11/22 0456 12/12/22 0333 12/13/22 0531  Weight: (!) 142.1 kg (!) 140.5 kg (!) 136.7 kg    Telemetry  NSR, rare PVCs:  Personally Reviewed  ECG    NA - Personally Reviewed  Physical Exam   GEN: No  acute distress.   Neck: No  JVD Cardiac: RRR, no murmurs, rubs, or gallops.  Respiratory: Clear   to auscultation bilaterally. GI: Soft, nontender, non-distended,  normal bowel sounds  MS:  NO edema; No deformity.  Left leg wrapped.  Neuro:   Nonfocal  Psych: Oriented and appropriate     Labs    Chemistry Recent Labs  Lab 12/08/22 0509 12/09/22 0311 12/09/22 1737 12/10/22 0227 12/11/22 0342 12/12/22 0305 12/13/22 0500  NA 132* 137   < > 136 138 141 138  K 3.6 3.8   < > 4.0 3.9 3.7 4.0  CL 102 103  --  104 104 108 104  CO2 21* 23  --  23 26 23 24   GLUCOSE 121* 119*  --  108* 136* 126* 125*  BUN 29* 22*  --  20 18 17 17   CREATININE 1.73* 1.57*  --  1.32* 1.26* 1.25* 1.06*  CALCIUM 7.5* 8.3*  --  8.3* 8.8* 8.7* 9.2  PROT 5.7* 5.8*  --  5.5*  --   --   --   ALBUMIN 2.5* 2.5*  --  2.4*  --   --   --   AST 41 29  --  22  --   --   --   ALT 83* 63*  --  45*  --   --   --   ALKPHOS 32* 33*  --  31*  --   --   --  BILITOT 0.8 0.9  --  0.8  --   --   --   GFRNONAA 34* 38*  --  47* 50* 50* >60  ANIONGAP 9 11  --  9 8 10 10    < > = values in this interval not displayed.     Hematology Recent Labs  Lab 12/11/22 0342 12/12/22 0305 12/13/22 0500  WBC 6.9 7.0 8.1  RBC 4.54 4.48 4.32  HGB 12.6 12.9 12.4  HCT 38.7 38.7 37.8  MCV 85.2 86.4 87.5  MCH 27.8 28.8 28.7  MCHC 32.6 33.3 32.8  RDW 14.7 14.7 14.9  PLT 185 192 220    Cardiac EnzymesNo results for input(s): "TROPONINI" in the last 168 hours. No results for input(s): "TROPIPOC" in the last 168 hours.   BNPNo results for input(s): "BNP", "PROBNP" in the last 168 hours.   DDimer No results for input(s): "DDIMER" in the last 168 hours.   Radiology    MR CARDIAC MORPHOLOGY W WO CONTRAST  Result Date: 12/12/2022 CLINICAL DATA:  57F with chronic CHF p/w cardiac arrest. Cath with normal coronary arteries. Echo with EF 30-35%, normal RV function EXAM: CARDIAC MRI TECHNIQUE: The patient was scanned on a 1.5 Tesla Siemens magnet. A dedicated cardiac coil was used. Functional imaging was done using Fiesta sequences. 2,3, and 4 chamber views were done to assess for RWMA's. Modified  Simpson's rule using a short axis stack was used to calculate an ejection fraction on a dedicated work [employer], [employer]. The patient received 10 cc of Gadavist. After 10 minutes inversion recovery sequences were used to assess for infiltration and scar tissue. Phase contrast velocity mapping was performed above the aortic and pulmonic valves CONTRAST:  10 cc  of Gadavist FINDINGS: Left ventricle: -No hypertrophy -Normal size -Moderate systolic dysfunction -Elevated ECV (31%) -Normal T2 values -RV insertion site LGE LV EF:  31% (Normal 52-79%) Absolute volumes: LV EDV: (Normal 78-167 mL) LV ESV: (Normal 21-64 mL) LV SV: 72mL (Normal 52-114 mL) CO: 5.9L/min (Normal 2.7-6.3 L/min) Indexed volumes: LV EDV: 21mL/sq-m (Normal 50-96 mL/sq-m) LV ESV: 3mL/sq-m (Normal 10-40 mL/sq-m) LV SV: 63mL/sq-m (Normal 33-64 mL/sq-m) CI: 2.4L/min/sq-m (Normal 1.9-3.9 L/min/sq-m) Right ventricle: Normal size and systolic function RV EF: 50% (Normal 52-80%) Absolute volumes: RV EDV: (Normal 79-175 mL) RV ESV: 70mL (Normal 13-75 mL) RV SV: 69mL (Normal 56-110 mL) CO: 5.6L/min (Normal 2.7-6 L/min) Indexed volumes: RV EDV: 4mL/sq-m (Normal 51-97 mL/sq-m) RV ESV: 2mL/sq-m (Normal 9-42 mL/sq-m) RV SV: 53mL/sq-m (Normal 35-61 mL/sq-m) CI: 2.2L/min/sq-m (Normal 1.8-3.8 L/min/sq-m) Left atrium: Mild enlargement Right atrium: Normal size Mitral valve: Trivial regurgitation Aortic valve: Trivial regurgitation Tricuspid valve: Trivial regurgitation Pulmonic valve: Trivial regurgitation Aorta: Normal proximal ascending aorta Pericardium: Normal IMPRESSION: 1. Normal LV size, no hypertrophy, and moderate systolic dysfunction (EF 31%) 2.  Normal RV size and systolic function (EF 50%) 3. RV insertion site late gadolinium enhancement, which is a nonspecific scar pattern often seen in setting of elevated pulmonary pressures Electronically Signed   By: Epifanio Lesches M.D.   On: 12/12/2022 22:29   MR CARDIAC  VELOCITY FLOW MAP  Result Date: 12/12/2022 CLINICAL DATA:  57F with chronic CHF p/w cardiac arrest. Cath with normal coronary arteries. Echo with EF 30-35%, normal RV function EXAM: CARDIAC MRI TECHNIQUE: The patient was scanned on a 1.5 Tesla Siemens magnet. A dedicated cardiac coil was used. Functional imaging was done using Fiesta sequences. 2,3, and 4 chamber views were done to assess for RWMA's. Modified  Simpson's rule using a short axis stack was used to calculate an ejection fraction on a dedicated work [employer] officer, [employer]. The patient received 10 cc of Gadavist. After 10 minutes inversion recovery sequences were used to assess for infiltration and scar tissue. Phase contrast velocity mapping was performed above the aortic and pulmonic valves CONTRAST:  10 cc  of Gadavist FINDINGS: Left ventricle: -No hypertrophy -Normal size -Moderate systolic dysfunction -Elevated ECV (31%) -Normal T2 values -RV insertion site LGE LV EF:  31% (Normal 52-79%) Absolute volumes: LV EDV: (Normal 78-167 mL) LV ESV: (Normal 21-64 mL) LV SV: 72mL (Normal 52-114 mL) CO: 5.9L/min (Normal 2.7-6.3 L/min) Indexed volumes: LV EDV: 49mL/sq-m (Normal 50-96 mL/sq-m) LV ESV: 27mL/sq-m (Normal 10-40 mL/sq-m) LV SV: 27mL/sq-m (Normal 33-64 mL/sq-m) CI: 2.4L/min/sq-m (Normal 1.9-3.9 L/min/sq-m) Right ventricle: Normal size and systolic function RV EF: 50% (Normal 52-80%) Absolute volumes: RV EDV: (Normal 79-175 mL) RV ESV: 70mL (Normal 13-75 mL) RV SV: 69mL (Normal 56-110 mL) CO: 5.6L/min (Normal 2.7-6 L/min) Indexed volumes: RV EDV: 11mL/sq-m (Normal 51-97 mL/sq-m) RV ESV: 46mL/sq-m (Normal 9-42 mL/sq-m) RV SV: 57mL/sq-m (Normal 35-61 mL/sq-m) CI: 2.2L/min/sq-m (Normal 1.8-3.8 L/min/sq-m) Left atrium: Mild enlargement Right atrium: Normal size Mitral valve: Trivial regurgitation Aortic valve: Trivial regurgitation Tricuspid valve: Trivial regurgitation Pulmonic valve: Trivial regurgitation Aorta: Normal proximal  ascending aorta Pericardium: Normal IMPRESSION: 1. Normal LV size, no hypertrophy, and moderate systolic dysfunction (EF 31%) 2.  Normal RV size and systolic function (EF 50%) 3. RV insertion site late gadolinium enhancement, which is a nonspecific scar pattern often seen in setting of elevated pulmonary pressures Electronically Signed   By: Epifanio Lesches M.D.   On: 12/12/2022 22:29   MR CARDIAC VELOCITY FLOW MAP  Result Date: 12/12/2022 CLINICAL DATA:  22F with chronic CHF p/w cardiac arrest. Cath with normal coronary arteries. Echo with EF 30-35%, normal RV function EXAM: CARDIAC MRI TECHNIQUE: The patient was scanned on a 1.5 Tesla Siemens magnet. A dedicated cardiac coil was used. Functional imaging was done using Fiesta sequences. 2,3, and 4 chamber views were done to assess for RWMA's. Modified Simpson's rule using a short axis stack was used to calculate an ejection fraction on a dedicated work [employer] officer, [employer]. The patient received 10 cc of Gadavist. After 10 minutes inversion recovery sequences were used to assess for infiltration and scar tissue. Phase contrast velocity mapping was performed above the aortic and pulmonic valves CONTRAST:  10 cc  of Gadavist FINDINGS: Left ventricle: -No hypertrophy -Normal size -Moderate systolic dysfunction -Elevated ECV (31%) -Normal T2 values -RV insertion site LGE LV EF:  31% (Normal 52-79%) Absolute volumes: LV EDV: (Normal 78-167 mL) LV ESV: (Normal 21-64 mL) LV SV: 72mL (Normal 52-114 mL) CO: 5.9L/min (Normal 2.7-6.3 L/min) Indexed volumes: LV EDV: 41mL/sq-m (Normal 50-96 mL/sq-m) LV ESV: 63mL/sq-m (Normal 10-40 mL/sq-m) LV SV: 39mL/sq-m (Normal 33-64 mL/sq-m) CI: 2.4L/min/sq-m (Normal 1.9-3.9 L/min/sq-m) Right ventricle: Normal size and systolic function RV EF: 50% (Normal 52-80%) Absolute volumes: RV EDV: (Normal 79-175 mL) RV ESV: 70mL (Normal 13-75 mL) RV SV: 69mL (Normal 56-110 mL) CO: 5.6L/min (Normal 2.7-6 L/min)  Indexed volumes: RV EDV: 44mL/sq-m (Normal 51-97 mL/sq-m) RV ESV: 47mL/sq-m (Normal 9-42 mL/sq-m) RV SV: 63mL/sq-m (Normal 35-61 mL/sq-m) CI: 2.2L/min/sq-m (Normal 1.8-3.8 L/min/sq-m) Left atrium: Mild enlargement Right atrium: Normal size Mitral valve: Trivial regurgitation Aortic valve: Trivial regurgitation Tricuspid valve: Trivial regurgitation Pulmonic valve: Trivial regurgitation Aorta: Normal proximal ascending aorta Pericardium: Normal IMPRESSION: 1. Normal LV  size, no hypertrophy, and moderate systolic dysfunction (EF 31%) 2.  Normal RV size and systolic function (EF 50%) 3. RV insertion site late gadolinium enhancement, which is a nonspecific scar pattern often seen in setting of elevated pulmonary pressures Electronically Signed   By: Epifanio Lesches M.D.   On: 12/12/2022 22:29    Cardiac Studies   Cath above:  NL coronaries.   Right heart pressures OK.    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  Now status post cardiac arrest.    Assessment & Plan    Cardiac arrest:  Likely primary arrhythmic .    ICD after leg wound has healed more.   Considering a Life Vest if her size allows.   Nonischemic cardiomyopathy:  Previous class I symptoms.. She has not had significant LVH  or evidence of other etiology.   Was initially thought to be post partum.  No significant findings on MRI this admission.  Started spironolactone yesterday.  Switched to once daily meds.  Continue current meds and I will titrate further as an out patient.   AKI:  Creat appears continues to fall.    Leg wounds:  We will arrange out patient wound clinic follow up.   ADHD:  This is her biggest concern.  She was to be started on Ritalin.   See previous note.   Other:  Home in next few days.   She might need home O2 for the short term.    For questions or updates, please contact [facility] Please consult [email or web address] for contact info under Cardiology/STEMI.   Signed, [name],  MD  12/13/2022, 8:17 AM

## 2022-12-13 NOTE — Progress Notes (Signed)
Life vest team unable to see patient today until later this evening Discussed with dr. [name]. Life vest remains our preference for her in the short term given her infection risk. Will await their visit If she is not a life vest candidate, will plan for implant nex week. Discussed with the patient and family bedside, they understand and are agreeable I made attending aware.  Francis Dowse, PA-C

## 2022-12-13 NOTE — Progress Notes (Signed)
Inpatient Rehab Admissions Coordinator:  Continue to await medical readiness and bed availability. Will continue to follow.   Wolfgang Phoenix, MS, CCC-SLP Admissions Coordinator [phone]

## 2022-12-14 DIAGNOSIS — I4901 Ventricular fibrillation: Secondary | ICD-10-CM | POA: Diagnosis not present

## 2022-12-14 DIAGNOSIS — I469 Cardiac arrest, cause unspecified: Secondary | ICD-10-CM | POA: Diagnosis not present

## 2022-12-14 LAB — GLUCOSE, CAPILLARY
Glucose-Capillary: 108 mg/dL — ABNORMAL HIGH (ref 70–99)
Glucose-Capillary: 114 mg/dL — ABNORMAL HIGH (ref 70–99)
Glucose-Capillary: 105 mg/dL — ABNORMAL HIGH (ref 70–99)
Glucose-Capillary: 115 mg/dL — ABNORMAL HIGH (ref 70–99)

## 2022-12-14 MED ORDER — FUROSEMIDE 40 MG PO TABS
40.0000 mg | ORAL_TABLET | Freq: Every day | ORAL | Status: DC
  Administered 2022-12-14 – 2022-12-17 (×4): 40 mg via ORAL
  Filled 2022-12-14 (×4): qty 1

## 2022-12-14 NOTE — Progress Notes (Signed)
   12/14/22 2014  BiPAP/CPAP/SIPAP  BiPAP/CPAP/SIPAP Pt Type Adult  Reason BIPAP/CPAP not in use Non-compliant

## 2022-12-14 NOTE — Progress Notes (Signed)
Progress Note  Patient Name: [name] Date of Encounter: 12/14/2022  Primary Cardiologist:   [name], MD   Subjective   She is complaining or right sided chest pain with movement and palpation.  Coughed up some red clot probably residual from intubation.   Inpatient Medications    Scheduled Meds:  atorvastatin  80 mg Oral Daily   Chlorhexidine Gluconate Cloth  6 each Topical Daily   dapagliflozin propanediol  10 mg Oral Daily   heparin injection (subcutaneous)  5,000 Units Subcutaneous Q8H   insulin aspart  0-20 Units Subcutaneous TID WC   losartan  50 mg Oral Daily   metoprolol succinate  50 mg Oral Daily   mouth rinse  15 mL Mouth Rinse 4 times per day   sodium chloride flush  10 mL Intravenous Q12H   sodium chloride flush  3 mL Intravenous Q12H   spironolactone  12.5 mg Oral Daily   Continuous Infusions:   PRN Meds: acetaminophen **OR** acetaminophen (TYLENOL) oral liquid 160 mg/5 mL **OR** acetaminophen, ALPRAZolam, fentaNYL (SUBLIMAZE) injection, hydrOXYzine, mouth rinse, oxyCODONE, polyethylene glycol, sodium chloride flush   Vital Signs    Vitals:   12/13/22 2341 12/13/22 2344 12/14/22 0219 12/14/22 0504  BP:  134/82  116/77  Pulse:  80    Resp: 14 16  15   Temp:  98.3 F (36.8 C)  98.3 F (36.8 C)  TempSrc:  Oral  Oral  SpO2:  100%  96%  Weight:   135.9 kg   Height:        Intake/Output Summary (Last 24 hours) at 12/14/2022 0847 Last data filed at 12/13/2022 2300 Gross per 24 hour  Intake --  Output 750 ml  Net -750 ml   Filed Weights   12/12/22 0333 12/13/22 0531 12/14/22 0219  Weight: (!) 140.5 kg (!) 136.7 kg 135.9 kg    Telemetry  NSR with PVCs:  Personally Reviewed  ECG    NA - Personally Reviewed  Physical Exam   GEN: No  acute distress.   Neck: No  JVD Cardiac: RRR, no murmurs, rubs, or gallops.  Respiratory: Clear   to auscultation bilaterally. GI: Soft, nontender, non-distended, normal bowel sounds  MS:  No  edema; No deformity. Neuro:   Nonfocal  Psych: Oriented and appropriate   Labs    Chemistry Recent Labs  Lab 12/08/22 0509 12/09/22 0311 12/09/22 1737 12/10/22 0227 12/11/22 0342 12/12/22 0305 12/13/22 0500  NA 132* 137   < > 136 138 141 138  K 3.6 3.8   < > 4.0 3.9 3.7 4.0  CL 102 103  --  104 104 108 104  CO2 21* 23  --  23 26 23 24   GLUCOSE 121* 119*  --  108* 136* 126* 125*  BUN 29* 22*  --  20 18 17 17   CREATININE 1.73* 1.57*  --  1.32* 1.26* 1.25* 1.06*  CALCIUM 7.5* 8.3*  --  8.3* 8.8* 8.7* 9.2  PROT 5.7* 5.8*  --  5.5*  --   --   --   ALBUMIN 2.5* 2.5*  --  2.4*  --   --   --   AST 41 29  --  22  --   --   --   ALT 83* 63*  --  45*  --   --   --   ALKPHOS 32* 33*  --  31*  --   --   --   BILITOT 0.8 0.9  --  0.8  --   --   --   GFRNONAA 34* 38*  --  47* 50* 50* >60  ANIONGAP 9 11  --  9 8 10 10    < > = values in this interval not displayed.     Hematology Recent Labs  Lab 12/11/22 0342 12/12/22 0305 12/13/22 0500  WBC 6.9 7.0 8.1  RBC 4.54 4.48 4.32  HGB 12.6 12.9 12.4  HCT 38.7 38.7 37.8  MCV 85.2 86.4 87.5  MCH 27.8 28.8 28.7  MCHC 32.6 33.3 32.8  RDW 14.7 14.7 14.9  PLT 185 192 220    Cardiac EnzymesNo results for input(s): "TROPONINI" in the last 168 hours. No results for input(s): "TROPIPOC" in the last 168 hours.   BNPNo results for input(s): "BNP", "PROBNP" in the last 168 hours.   DDimer No results for input(s): "DDIMER" in the last 168 hours.   Radiology    MR CARDIAC MORPHOLOGY W WO CONTRAST  Result Date: 12/12/2022 CLINICAL DATA:  81F with chronic CHF p/w cardiac arrest. Cath with normal coronary arteries. Echo with EF 30-35%, normal RV function EXAM: CARDIAC MRI TECHNIQUE: The patient was scanned on a 1.5 Tesla Siemens magnet. A dedicated cardiac coil was used. Functional imaging was done using Fiesta sequences. 2,3, and 4 chamber views were done to assess for RWMA's. Modified Simpson's rule using a short axis stack was used to  calculate an ejection fraction on a dedicated work Research officer, trade union. The patient received 10 cc of Gadavist. After 10 minutes inversion recovery sequences were used to assess for infiltration and scar tissue. Phase contrast velocity mapping was performed above the aortic and pulmonic valves CONTRAST:  10 cc  of Gadavist FINDINGS: Left ventricle: -No hypertrophy -Normal size -Moderate systolic dysfunction -Elevated ECV (31%) -Normal T2 values -RV insertion site LGE LV EF:  31% (Normal 52-79%) Absolute volumes: LV EDV: (Normal 78-167 mL) LV ESV: (Normal 21-64 mL) LV SV: 72mL (Normal 52-114 mL) CO: 5.9L/min (Normal 2.7-6.3 L/min) Indexed volumes: LV EDV: 68mL/sq-m (Normal 50-96 mL/sq-m) LV ESV: 44mL/sq-m (Normal 10-40 mL/sq-m) LV SV: 78mL/sq-m (Normal 33-64 mL/sq-m) CI: 2.4L/min/sq-m (Normal 1.9-3.9 L/min/sq-m) Right ventricle: Normal size and systolic function RV EF: 50% (Normal 52-80%) Absolute volumes: RV EDV: (Normal 79-175 mL) RV ESV: 70mL (Normal 13-75 mL) RV SV: 69mL (Normal 56-110 mL) CO: 5.6L/min (Normal 2.7-6 L/min) Indexed volumes: RV EDV: 1mL/sq-m (Normal 51-97 mL/sq-m) RV ESV: 49mL/sq-m (Normal 9-42 mL/sq-m) RV SV: 50mL/sq-m (Normal 35-61 mL/sq-m) CI: 2.2L/min/sq-m (Normal 1.8-3.8 L/min/sq-m) Left atrium: Mild enlargement Right atrium: Normal size Mitral valve: Trivial regurgitation Aortic valve: Trivial regurgitation Tricuspid valve: Trivial regurgitation Pulmonic valve: Trivial regurgitation Aorta: Normal proximal ascending aorta Pericardium: Normal IMPRESSION: 1. Normal LV size, no hypertrophy, and moderate systolic dysfunction (EF 31%) 2.  Normal RV size and systolic function (EF 50%) 3. RV insertion site late gadolinium enhancement, which is a nonspecific scar pattern often seen in setting of elevated pulmonary pressures Electronically Signed   By: Epifanio Lesches M.D.   On: 12/12/2022 22:29   MR CARDIAC VELOCITY FLOW MAP  Result Date: 12/12/2022 CLINICAL  DATA:  81F with chronic CHF p/w cardiac arrest. Cath with normal coronary arteries. Echo with EF 30-35%, normal RV function EXAM: CARDIAC MRI TECHNIQUE: The patient was scanned on a 1.5 Tesla Siemens magnet. A dedicated cardiac coil was used. Functional imaging was done using Fiesta sequences. 2,3, and 4 chamber views were done to assess for RWMA's. Modified Simpson's rule using a short axis  stack was used to calculate an ejection fraction on a dedicated work Research officer, trade union. The patient received 10 cc of Gadavist. After 10 minutes inversion recovery sequences were used to assess for infiltration and scar tissue. Phase contrast velocity mapping was performed above the aortic and pulmonic valves CONTRAST:  10 cc  of Gadavist FINDINGS: Left ventricle: -No hypertrophy -Normal size -Moderate systolic dysfunction -Elevated ECV (31%) -Normal T2 values -RV insertion site LGE LV EF:  31% (Normal 52-79%) Absolute volumes: LV EDV: (Normal 78-167 mL) LV ESV: (Normal 21-64 mL) LV SV: 72mL (Normal 52-114 mL) CO: 5.9L/min (Normal 2.7-6.3 L/min) Indexed volumes: LV EDV: 36mL/sq-m (Normal 50-96 mL/sq-m) LV ESV: 26mL/sq-m (Normal 10-40 mL/sq-m) LV SV: 75mL/sq-m (Normal 33-64 mL/sq-m) CI: 2.4L/min/sq-m (Normal 1.9-3.9 L/min/sq-m) Right ventricle: Normal size and systolic function RV EF: 50% (Normal 52-80%) Absolute volumes: RV EDV: (Normal 79-175 mL) RV ESV: 70mL (Normal 13-75 mL) RV SV: 69mL (Normal 56-110 mL) CO: 5.6L/min (Normal 2.7-6 L/min) Indexed volumes: RV EDV: 60mL/sq-m (Normal 51-97 mL/sq-m) RV ESV: 24mL/sq-m (Normal 9-42 mL/sq-m) RV SV: 82mL/sq-m (Normal 35-61 mL/sq-m) CI: 2.2L/min/sq-m (Normal 1.8-3.8 L/min/sq-m) Left atrium: Mild enlargement Right atrium: Normal size Mitral valve: Trivial regurgitation Aortic valve: Trivial regurgitation Tricuspid valve: Trivial regurgitation Pulmonic valve: Trivial regurgitation Aorta: Normal proximal ascending aorta Pericardium: Normal IMPRESSION: 1.  Normal LV size, no hypertrophy, and moderate systolic dysfunction (EF 31%) 2.  Normal RV size and systolic function (EF 50%) 3. RV insertion site late gadolinium enhancement, which is a nonspecific scar pattern often seen in setting of elevated pulmonary pressures Electronically Signed   By: Epifanio Lesches M.D.   On: 12/12/2022 22:29   MR CARDIAC VELOCITY FLOW MAP  Result Date: 12/12/2022 CLINICAL DATA:  22F with chronic CHF p/w cardiac arrest. Cath with normal coronary arteries. Echo with EF 30-35%, normal RV function EXAM: CARDIAC MRI TECHNIQUE: The patient was scanned on a 1.5 Tesla Siemens magnet. A dedicated cardiac coil was used. Functional imaging was done using Fiesta sequences. 2,3, and 4 chamber views were done to assess for RWMA's. Modified Simpson's rule using a short axis stack was used to calculate an ejection fraction on a dedicated work Research officer, trade union. The patient received 10 cc of Gadavist. After 10 minutes inversion recovery sequences were used to assess for infiltration and scar tissue. Phase contrast velocity mapping was performed above the aortic and pulmonic valves CONTRAST:  10 cc  of Gadavist FINDINGS: Left ventricle: -No hypertrophy -Normal size -Moderate systolic dysfunction -Elevated ECV (31%) -Normal T2 values -RV insertion site LGE LV EF:  31% (Normal 52-79%) Absolute volumes: LV EDV: (Normal 78-167 mL) LV ESV: (Normal 21-64 mL) LV SV: 72mL (Normal 52-114 mL) CO: 5.9L/min (Normal 2.7-6.3 L/min) Indexed volumes: LV EDV: 31mL/sq-m (Normal 50-96 mL/sq-m) LV ESV: 85mL/sq-m (Normal 10-40 mL/sq-m) LV SV: 32mL/sq-m (Normal 33-64 mL/sq-m) CI: 2.4L/min/sq-m (Normal 1.9-3.9 L/min/sq-m) Right ventricle: Normal size and systolic function RV EF: 50% (Normal 52-80%) Absolute volumes: RV EDV: (Normal 79-175 mL) RV ESV: 70mL (Normal 13-75 mL) RV SV: 69mL (Normal 56-110 mL) CO: 5.6L/min (Normal 2.7-6 L/min) Indexed volumes: RV EDV: 6mL/sq-m (Normal 51-97  mL/sq-m) RV ESV: 7mL/sq-m (Normal 9-42 mL/sq-m) RV SV: 56mL/sq-m (Normal 35-61 mL/sq-m) CI: 2.2L/min/sq-m (Normal 1.8-3.8 L/min/sq-m) Left atrium: Mild enlargement Right atrium: Normal size Mitral valve: Trivial regurgitation Aortic valve: Trivial regurgitation Tricuspid valve: Trivial regurgitation Pulmonic valve: Trivial regurgitation Aorta: Normal proximal ascending aorta Pericardium: Normal IMPRESSION: 1. Normal LV size, no hypertrophy, and moderate systolic  dysfunction (EF 31%) 2.  Normal RV size and systolic function (EF 50%) 3. RV insertion site late gadolinium enhancement, which is a nonspecific scar pattern often seen in setting of elevated pulmonary pressures Electronically Signed   By: Epifanio Lesches M.D.   On: 12/12/2022 22:29    Cardiac Studies   Cath above:  NL coronaries.   Right heart pressures OK.    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  Now status post cardiac arrest.    Assessment & Plan    Cardiac arrest:  Likely primary arrhythmic .    ICD after leg wound has healed more.   She has been fitted with a Life Vest   Nonischemic cardiomyopathy:  Previous class I symptoms.. She has not had significant LVH  or evidence of other etiology.   Was initially thought to be post partum.  No significant findings on MRI this admission.  Titrating back toward her previous GDMT.    I am going to start her back on PO Lasix.  She has required this always in the past.   AKI:  Creat appears continues to fall.     Leg wounds:  Dressing per nursing and wound care.    ADHD:  This is her biggest concern.  She was to be started on Ritalin.     This has been approved and she will start after she gets her implanted ICD.   Other:    Using CPAP at night.  Ambulating with PT.  She might need home O2 for the short term.   We will continue to follow and will need O2 sats checked on RA with ambulation after the ICD.  Most recently sats were still falling and she might need  home O2.    Encouraged incentive spirometry and sitting up today.    For questions or updates, please contact [facility] Please consult [email or web address] for contact info under Cardiology/STEMI.   Signed, [name], MD  12/14/2022, 8:47 AM

## 2022-12-14 NOTE — Plan of Care (Signed)
  Problem: Education: Goal: Ability to describe self-care measures that may prevent or decrease complications (Diabetes Survival Skills Education) will improve Outcome: Progressing   Problem: Nutritional: Goal: Maintenance of adequate nutrition will improve Outcome: Progressing   Problem: Activity: Goal: Risk for activity intolerance will decrease Outcome: Progressing   Problem: Pain Managment: Goal: General experience of comfort will improve Outcome: Progressing

## 2022-12-14 NOTE — Progress Notes (Signed)
   12/13/22 2341  BiPAP/CPAP/SIPAP  Reason BIPAP/CPAP not in use Non-compliant (pt said she not comfortable with cpap at this time)

## 2022-12-14 NOTE — Progress Notes (Signed)
.  Mobility Specialist Progress Note:   12/14/22 1337  Mobility  Activity Ambulated with assistance in room  Level of Assistance Contact guard assist, steadying assist  Assistive Device Front wheel walker  Distance Ambulated (ft) 60 ft  RUE Weight Bearing WBAT  Activity Response Tolerated well  Mobility Referral Yes  $Mobility charge 1 Mobility  Mobility Specialist Start Time (ACUTE ONLY) 1111  Mobility Specialist Stop Time (ACUTE ONLY) 1130  Mobility Specialist Time Calculation (min) (ACUTE ONLY) 19 min   Pre Mobility: 78 HR  During Mobility: 95 HR  Post Mobility: 81 HR  Pt received in chair, agreeable to mobility with encouragement. SB to stand. CG during ambulation. C/o pain in RUE, otherwise asymptomatic throughout. Pt returned to chair with call bell in reach and all needs met.  Leory Plowman  Mobility Specialist Please contact via [employer] office at [phone]

## 2022-12-15 DIAGNOSIS — I4901 Ventricular fibrillation: Secondary | ICD-10-CM | POA: Diagnosis not present

## 2022-12-15 DIAGNOSIS — I469 Cardiac arrest, cause unspecified: Secondary | ICD-10-CM | POA: Diagnosis not present

## 2022-12-15 LAB — GLUCOSE, CAPILLARY
Glucose-Capillary: 107 mg/dL — ABNORMAL HIGH (ref 70–99)
Glucose-Capillary: 183 mg/dL — ABNORMAL HIGH (ref 70–99)
Glucose-Capillary: 122 mg/dL — ABNORMAL HIGH (ref 70–99)

## 2022-12-15 NOTE — Plan of Care (Signed)
  Problem: Coping: Goal: Ability to adjust to condition or change in health will improve Outcome: Progressing   

## 2022-12-15 NOTE — Progress Notes (Signed)
SATURATION QUALIFICATIONS: (This note is used to comply with regulatory documentation for home oxygen)  Patient Saturations on Room Air at Rest = 98%  Patient Saturations on Room Air while Ambulating = 94%  Patient Saturations on 0 Liters of oxygen while Ambulating = 93%  Please briefly explain why patient needs home oxygen: Patient ambulated without any need for supplemental oxygen. Elnita Maxwell, RN

## 2022-12-15 NOTE — Progress Notes (Signed)
   12/15/22 2020  BiPAP/CPAP/SIPAP  BiPAP/CPAP/SIPAP Pt Type Adult  Reason BIPAP/CPAP not in use Non-compliant  BiPAP/CPAP /SiPAP Vitals  Pulse Rate 74  SpO2 96 %

## 2022-12-15 NOTE — Progress Notes (Signed)
Progress Note  Patient Name: [name] Date of Encounter: 12/15/2022  Primary Cardiologist:   [name], MD   Subjective   Very tearful today.  She has a sharp pain in the right upper chest with movement and palpation.    Inpatient Medications    Scheduled Meds:  atorvastatin  80 mg Oral Daily   Chlorhexidine Gluconate Cloth  6 each Topical Daily   dapagliflozin propanediol  10 mg Oral Daily   furosemide  40 mg Oral Daily   heparin injection (subcutaneous)  5,000 Units Subcutaneous Q8H   insulin aspart  0-20 Units Subcutaneous TID WC   losartan  50 mg Oral Daily   metoprolol succinate  50 mg Oral Daily   mouth rinse  15 mL Mouth Rinse 4 times per day   sodium chloride flush  10 mL Intravenous Q12H   sodium chloride flush  3 mL Intravenous Q12H   spironolactone  12.5 mg Oral Daily   Continuous Infusions:   PRN Meds: acetaminophen **OR** acetaminophen (TYLENOL) oral liquid 160 mg/5 mL **OR** acetaminophen, ALPRAZolam, fentaNYL (SUBLIMAZE) injection, hydrOXYzine, mouth rinse, oxyCODONE, polyethylene glycol, sodium chloride flush   Vital Signs    Vitals:   12/14/22 2018 12/15/22 0044 12/15/22 0513 12/15/22 0718  BP: 113/79 126/83 115/74 118/79  Pulse: 79 77 74 80  Resp: 20 15 14 19   Temp: 97.6 F (36.4 C) 97.6 F (36.4 C) 98 F (36.7 C) 98.3 F (36.8 C)  TempSrc: Oral Oral Oral Oral  SpO2: 100% 97% 97% 97%  Weight:   134.1 kg   Height:        Intake/Output Summary (Last 24 hours) at 12/15/2022 0742 Last data filed at 12/15/2022 0517 Gross per 24 hour  Intake 120 ml  Output 1050 ml  Net -930 ml   Filed Weights   12/13/22 0531 12/14/22 0219 12/15/22 0513  Weight: (!) 136.7 kg 135.9 kg 134.1 kg    Telemetry  NSR with PACs:  Personally Reviewed  ECG    NA - Personally Reviewed  Physical Exam   GEN:     Anxious acute distress.   Neck: No  JVD Cardiac: RRR, no murmurs, rubs, or gallops.  Respiratory: Clear   to auscultation  bilaterally. GI: Soft, nontender, non-distended, normal bowel sounds  MS:  No edema; No deformity. Neuro:   Nonfocal  Psych: Oriented and appropriate    Labs    Chemistry Recent Labs  Lab 12/09/22 0311 12/09/22 1737 12/10/22 0227 12/11/22 0342 12/12/22 0305 12/13/22 0500  NA 137   < > 136 138 141 138  K 3.8   < > 4.0 3.9 3.7 4.0  CL 103  --  104 104 108 104  CO2 23  --  23 26 23 24   GLUCOSE 119*  --  108* 136* 126* 125*  BUN 22*  --  20 18 17 17   CREATININE 1.57*  --  1.32* 1.26* 1.25* 1.06*  CALCIUM 8.3*  --  8.3* 8.8* 8.7* 9.2  PROT 5.8*  --  5.5*  --   --   --   ALBUMIN 2.5*  --  2.4*  --   --   --   AST 29  --  22  --   --   --   ALT 63*  --  45*  --   --   --   ALKPHOS 33*  --  31*  --   --   --   BILITOT 0.9  --  0.8  --   --   --   GFRNONAA 38*  --  47* 50* 50* >60  ANIONGAP 11  --  9 8 10 10    < > = values in this interval not displayed.     Hematology Recent Labs  Lab 12/11/22 0342 12/12/22 0305 12/13/22 0500  WBC 6.9 7.0 8.1  RBC 4.54 4.48 4.32  HGB 12.6 12.9 12.4  HCT 38.7 38.7 37.8  MCV 85.2 86.4 87.5  MCH 27.8 28.8 28.7  MCHC 32.6 33.3 32.8  RDW 14.7 14.7 14.9  PLT 185 192 220    Cardiac EnzymesNo results for input(s): "TROPONINI" in the last 168 hours. No results for input(s): "TROPIPOC" in the last 168 hours.   BNPNo results for input(s): "BNP", "PROBNP" in the last 168 hours.   DDimer No results for input(s): "DDIMER" in the last 168 hours.   Radiology    No results found.  Cardiac Studies   Cath above:  NL coronaries.   Right heart pressures OK.    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  Now status post cardiac arrest.    Assessment & Plan    Cardiac arrest:  Likely primary arrhythmic .    ICD after leg wound has healed more.   She has been fitted with a Technical sales engineer and educated.   Nonischemic cardiomyopathy:  Started back on PO Lasix yesterday.   Continue meds as on MAR for compliance we have tried to  change her to once daily meds as it turns out she was not taking her meds twice daily as prescribed.    AKI:  Creat normalized this admission.  Tolerating GDMT meds.   Leg wounds:  Dressing per nursing and wound care.   Needs to have Wound Care Clinic visits arraged at discharge.   ADHD:  This is her biggest concern.  She was to be started on Ritalin.     This has been approved and I believe at the pharmacy.  She will start after she gets her implanted ICD.   Other:    Using CPAP at night.  Ambulating with PT.  She might need home O2 for the short term.   Need to assess this ambulatory sats on RA today.  Chest pain:  This seems to be musculoskeletal.  I will give warm compresses.  She wants to talk to the primary team about home pain med therapy.     For questions or updates, please contact [facility] Please consult [email or web address] for contact info under Cardiology/STEMI.   Signed, [name], MD  12/15/2022, 7:42 AM

## 2022-12-16 ENCOUNTER — Encounter (HOSPITAL_BASED_OUTPATIENT_CLINIC_OR_DEPARTMENT_OTHER): Payer: Medicare Other

## 2022-12-16 DIAGNOSIS — I4901 Ventricular fibrillation: Secondary | ICD-10-CM | POA: Diagnosis not present

## 2022-12-16 DIAGNOSIS — I469 Cardiac arrest, cause unspecified: Secondary | ICD-10-CM | POA: Diagnosis not present

## 2022-12-16 DIAGNOSIS — S81802D Unspecified open wound, left lower leg, subsequent encounter: Secondary | ICD-10-CM

## 2022-12-16 DIAGNOSIS — I5042 Chronic combined systolic (congestive) and diastolic (congestive) heart failure: Secondary | ICD-10-CM

## 2022-12-16 LAB — MAGNESIUM: Magnesium: 1.8 mg/dL (ref 1.7–2.4)

## 2022-12-16 LAB — BASIC METABOLIC PANEL
Anion gap: 14 (ref 5–15)
BUN: 18 mg/dL (ref 6–20)
CO2: 23 mmol/L (ref 22–32)
Calcium: 9 mg/dL (ref 8.9–10.3)
Chloride: 101 mmol/L (ref 98–111)
Creatinine, Ser: 1.24 mg/dL — ABNORMAL HIGH (ref 0.44–1.00)
GFR, Estimated: 51 mL/min — ABNORMAL LOW (ref >60)
Glucose, Bld: 115 mg/dL — ABNORMAL HIGH (ref 70–99)
Potassium: 4.6 mmol/L (ref 3.5–5.1)
Sodium: 138 mmol/L (ref 135–145)

## 2022-12-16 LAB — C-REACTIVE PROTEIN: CRP: 2.8 mg/dL — ABNORMAL HIGH (ref <1.0)

## 2022-12-16 LAB — GLUCOSE, CAPILLARY
Glucose-Capillary: 123 mg/dL — ABNORMAL HIGH (ref 70–99)
Glucose-Capillary: 136 mg/dL — ABNORMAL HIGH (ref 70–99)
Glucose-Capillary: 121 mg/dL — ABNORMAL HIGH (ref 70–99)

## 2022-12-16 MED ORDER — DICLOFENAC SODIUM 1 % EX GEL
2.0000 g | Freq: Four times a day (QID) | CUTANEOUS | Status: DC
  Administered 2022-12-16 – 2022-12-17 (×3): 2 g via TOPICAL
  Filled 2022-12-16: qty 100

## 2022-12-16 NOTE — Progress Notes (Signed)
Inpatient Rehab Admissions Coordinator:   Following for CIR admit when medically ready and bed available, potentially tomorrow.  TRH to consult for LE wound today.  Estill Dooms, PT, DPT Admissions Coordinator [phone] 12/16/22  1:35 PM

## 2022-12-16 NOTE — Plan of Care (Signed)
  Problem: Coping: Goal: Ability to adjust to condition or change in health will improve Outcome: Progressing   Problem: Health Behavior/Discharge Planning: Goal: Ability to manage health-related needs will improve Outcome: Progressing   

## 2022-12-16 NOTE — Plan of Care (Signed)
  Problem: Education: Goal: Ability to describe self-care measures that may prevent or decrease complications (Diabetes Survival Skills Education) will improve Outcome: Progressing   Problem: Education: Goal: Individualized Educational Video(s) Outcome: Progressing   Problem: Fluid Volume: Goal: Ability to maintain a balanced intake and output will improve Outcome: Progressing   Problem: Health Behavior/Discharge Planning: Goal: Ability to identify and utilize available resources and services will improve Outcome: Progressing

## 2022-12-16 NOTE — Consult Note (Addendum)
Initial Consultation Note   Patient: [name] WGN:[id number] DOB: January 07, 1966 PCP: [name], MD DOA: 12/05/2022 DOS: the patient was seen and examined on 12/16/2022 Primary service: [name], MD  Referring physician: Sander Nephew, MD Reason for consult: left leg wound and assume care  Assessment/Plan:[name] is a 57 year old female with pmh postpartum cardiomyopathy, systolic CHF, diabetes mellitus type 2, depression, morbid obesity who presented on 10/10 after suffering cardiac arrest secondary to VT/V-fib arrest requiring intubation, pressure support, and admission into the ICU.  Cardiac workup noted EF to be 30 to 35% with normal coronaries.  Plan was for possible AICD, but patient has left leg wounds for which EP cardiology deferred implantation at this time and recommended LifeVest.  TRH called to assume care and assess leg wound.  Assessment and Plan:  S/p V-fib arrest Patient had been noted to have V-fib as the cause of cardiac arrest.  She had been evaluated by EP and due to the wound of the left leg had been fitted for a LifeVest in the interim. -Continue outpatient follow-up with EP  Chronic systolic congestive heart failure Patient appears to be fairly euvolemic at this time.  Echocardiogram noted EF to be 30 to 35% with normal RV function.  Cardiac catheterization from 10/12 noted normal coronaries. -Continue Lasix, losartan, metoprolol, spironolactone, and Farxiga  Left leg wound secondary to IO placement Patient noted to have developed significant wound of the left leg after IO placement prior to arrival with EMS.  Wound care have been consulted and placed orders for care.  At this time wound appears to be healing. -Continue current wound care orders -Will need to touch base with plastics in a.m.   Old diabetes mellitus type 2, without long-term use of insulin Blood sugars appear to be fairly maintained.  Hemoglobin A1c was noted to be 6.6 on  12/07/2022. -Hypoglycemic protocols -Continue Farxiga -CBGs before every meal with sensitive SSI  Chronic kidney disease stage IIIa Creatinine 1.24 which appears to be around recent baseline. -Continue to monitor  Hyperlipidemia -Continue atorvastatin   TRH will continue to follow the patient.  HPI: [name] is a 57 y.o. female with past medical history of hypertension, postpartum cardiomyopathy, chronic systolic CHF last Ef 30-35%, diabetes mellitus type 2, depression, OSA, and morbid obesity who presented to [facility] on 10/10 after suffering cardiac arrest.  Patient's husband had attempted CPR prior to EMS arrival.  Patient was found to be in VT/VF and ACLS was administered.  It was reported that patient had several arrests totaling up to 40 minutes each noted to be VT/V-fib.  IV access was obtained with use of a left leg IO.  She had been intubated for airway protection and admitted by PCCM on Levophed for shock.  Echocardiogram from 10/12 noted EF to be 30 to 35% with normal RV function.  Patient was noted to have weeping wounds with signs of skin necrosis of the left lower extremity from where the IO had been placed.   Orthopedics had been consulted, but felt there was no signs of compartments syndrome and concerns of skin process involving a large area of the anterior aspect of the lower leg to be followed up with Dr. Lajoyce Corners or plastic surgery.  Subsequently patient had cardiac cath on 10/14 which showed normal coronaries. cardiac MRI obtained 10/17 noted LVEF of 31% with RVEF 50%.  Patient had been able to be extubated and transition out of the ICU.  TRH called to evaluate for management  of left leg wound and to assume care.  Patient notes that she still feels like she has been hit by a [place] truck.  Any movement feels like she is pulling muscles and the pain radiates into her right scapula.  Since the incident she feels like her memory is significantly poor and she has also had severe pain  in her right leg.  Normally she is on ibuprofen 800 mg, but here that they have only been giving her 600 mg and it is not helping.   Review of Systems: As mentioned in the history of present illness. All other systems reviewed and are negative. Past Medical History:  Diagnosis Date   ADD (attention deficit disorder)    Anxiety    Arthritis    both knees right worse than left   Carpal tunnel syndrome of right wrist    Depression    Essential hypertension, benign    Gallstones    History of smoking 06/22/2016   Hyperlipidemia associated with type 2 diabetes mellitus (HCC), on Zocor 04/04/2011   Hypertension associated with diabetes (HCC) 06/03/2008   Migraines    Morbid obesity (HCC)    Morbid obesity with BMI of 50.0-59.9, adult (HCC) 07/12/2006   NICM (nonischemic cardiomyopathy) (HCC) 07/12/2006   01/16/18 ECHO:    - Procedure narrative: Transthoracic echocardiography. Image   quality was suboptimal. The study was technically difficult.   Intravenous contrast (Definity) was administered. - Left ventricle: The cavity size was moderately dilated. Wall   thickness was increased in a pattern of mild LVH. Systolic   function was moderately to severely reduced. The estimated   ejection fraction w   Normal coronary arteries 04/17/2016   OSA on CPAP 10/28/2014   Spinal stenosis    back pain   Spondylosis without myelopathy or radiculopathy, lumbar region 12/04/2017   Type 2 diabetes mellitus with hyperglycemia [facility])    Past Surgical History:  Procedure Laterality Date   CHOLECYSTECTOMY     DILATATION & CURETTAGE/HYSTEROSCOPY WITH MYOSURE N/A 10/31/2017   Procedure: DILATATION & CURETTAGE/HYSTEROSCOPY WITH MYOSURE;  Surgeon: Romualdo Bolk, MD;  Location: [facility] ORS;  Service: Gynecology;  Laterality: N/A;   HYSTEROSCOPY WITH D & C N/A 05/07/2022   Procedure: DILATATION AND CURETTAGE /HYSTEROSCOPY;  Surgeon: [name], MD;  Location: [facility] OR;  Service: Gynecology;  Laterality: N/A;    RIGHT/LEFT HEART CATH AND CORONARY ANGIOGRAPHY N/A 04/11/2016   Procedure: Right/Left Heart Cath and Coronary Angiography;  Surgeon: Kathleene Hazel, MD;  Location: [facility] INVASIVE CV LAB;  Service: Cardiovascular;  Laterality: N/A;   RIGHT/LEFT HEART CATH AND CORONARY ANGIOGRAPHY N/A 12/09/2022   Procedure: RIGHT/LEFT HEART CATH AND CORONARY ANGIOGRAPHY;  Surgeon: Yates Decamp, MD;  Location: [facility] INVASIVE CV LAB;  Service: Cardiovascular;  Laterality: N/A;   sonogram for blood clots     no blockages   Social History:  reports that she quit smoking about 25 years ago. Her smoking use included cigarettes. She has never used smokeless tobacco. She reports that she does not drink alcohol and does not use drugs.  Allergies  Allergen Reactions   Fluoxetine Other (See Comments)    More depressed   Cymbalta [Duloxetine Hcl] Other (See Comments)    depressed   Pork-Derived Products    Penicillins Rash    Has patient had a PCN reaction causing immediate rash, facial/tongue/throat swelling, SOB or lightheadedness with hypotension: YES Has patient had a PCN reaction causing severe rash involving mucus membranes or skin necrosis: NO Has  patient had a PCN reaction that required hospitalization: YES Has patient had a PCN reaction occurring within the last 10 years: NO If all of the above answers are "NO", then may proceed with Cephalosporin use.     Family History  Problem Relation Age of Onset   Depression Mother    Anxiety disorder Mother    Diabetes Mother    Hypertension Mother    ADD / ADHD Father    Diabetes Father    Hypertension Father    Colitis Father    Hypertension Other    Diabetes Other    Colitis Other    Alcohol abuse Other     Prior to Admission medications   Medication Sig Start Date End Date Taking? Authorizing Provider  cariprazine (VRAYLAR) 3 MG capsule Take 1 capsule (3 mg total) by mouth daily. Patient taking differently: Take 1.5 mg by mouth daily. 12/05/22  Yes  Nwoko, Uchenna E, PA  carvedilol (COREG) 25 MG tablet TAKE 1 TABLET (25 MG TOTAL) BY MOUTH TWICE A DAY WITH MEALS Patient taking differently: Take 25 mg by mouth 2 (two) times daily with a meal. 06/10/22  Yes [name], MD  Dapagliflozin Pro-metFORMIN ER (XIGDUO XR) 11-998 MG TB24 Take 1 tablet by mouth daily. 10/01/22  Yes [name], MD  escitalopram (LEXAPRO) 10 MG tablet Take 1 tablet (10 mg total) by mouth daily. 12/05/22 12/05/23 Yes Nwoko, Stephens Shire E, PA  furosemide (LASIX) 80 MG tablet TAKE 1 TABLET BY MOUTH EVERY DAY 11/04/22  Yes Azalee Course, PA  gabapentin (NEURONTIN) 100 MG capsule Take 1 capsule (100 mg total) by mouth 3 (three) times daily. 12/05/22  Yes Nwoko, Uchenna E, PA  potassium chloride SA (KLOR-CON M20) 20 MEQ tablet TAKE 2 TABLETS BY MOUTH DAILY 06/10/22  Yes Hochrein, Fayrene Fearing, MD  sacubitril-valsartan (ENTRESTO) 97-103 MG Take 1 tablet by mouth 2 (two) times daily. 06/10/22  Yes [name], MD  simvastatin (ZOCOR) 20 MG tablet TAKE 1 TABLET BY MOUTH EVERY DAY 10/05/22  Yes [name], MD  Blood Glucose Monitoring Suppl DEVI 1 each by Does not apply route in the morning, at noon, and at bedtime. May substitute to any manufacturer covered by patient's insurance. 11/15/22   Elenore Paddy, NP  Glucose Blood (BLOOD GLUCOSE TEST STRIPS) STRP 1 each by In Vitro route in the morning, at noon, and at bedtime. May substitute to any manufacturer covered by patient's insurance. 11/15/22 12/20/23  Elenore Paddy, NP  Misc. Devices (FOAM CUSHION THERAPEUTIC RING) MISC Use while sitting 04/23/22   Milas Hock, MD  NON FORMULARY Pt uses cpap nightly    [provider]  tirzepatide [facility]) 2.5 MG/0.5ML Pen INJECT 2.5 MG SUBCUTANEOUSLY WEEKLY Patient not taking: Reported on 12/05/2022 10/14/22   [name], MD    Physical Exam: Vitals:   12/15/22 2359 12/16/22 0425 12/16/22 0500 12/16/22 0927  BP: 102/76 127/86  112/69  Pulse: 75 75  80  Resp: 16 11  20   Temp:  98.4 F (36.9 C) 98.7 F (37.1 C)  98.3 F (36.8 C)  TempSrc: Oral Oral  Oral  SpO2: 92% 93%  96%  Weight:   133.9 kg   Height:       Constitutional: Middle-age female who appears to be in some discomfort sitting up in the hospital chair Eyes: PERRL, lids and conjunctivae normal ENMT: Mucous membranes are moist. Posterior pharynx clear of any exudate or lesions.Normal dentition.  Neck: normal, supple, no masses, no  thyromegaly Respiratory: clear to auscultation bilaterally, no wheezing, no crackles. Normal respiratory effort. No accessory muscle use.  Cardiovascular: Regular rate and rhythm, no murmurs / rubs / gallops. No extremity edema. 2+ pedal pulses. No carotid bruits.  Abdomen: no tenderness, no masses palpated. No hepatosplenomegaly. Bowel sounds positive.  Musculoskeletal: no clubbing / cyanosis. No joint deformity upper and lower extremities. Skin: Large wound of the left lower extremity knee down the anterior shin with tissue loss as seen below with serosanguineous drainage present on dressings.   Neurologic: CN 2-12 grossly intact. Sensation intact, DTR normal. Strength 5/5 in all 4.  Psychiatric: Normal judgment and insight. Alert and oriented x 3. Normal mood.    Data Reviewed:   Reviewed labs, imaging, and pertinent records as documented.   Family Communication: Husband updated at bedside Primary team communication: Thank you very much for involving [place] in the care of your patient.  Author: [name], MD 12/16/2022 9:47 AM  For on call review [email or web address].

## 2022-12-16 NOTE — Progress Notes (Addendum)
Heart Failure Nurse Navigator Progress Note  Rescheduled pt TOC because she was still in the hospital and plans for CIR at discharge. Pt's new appointment is 01/06/23 @ 12:00 pm.  Pt made aware and given new appointment card.  Roxy Horseman, RN, BSN [facility] Heart Failure Navigator Secure Chat Only

## 2022-12-16 NOTE — Progress Notes (Signed)
Progress Note  Patient Name: [name] Date of Encounter: 12/16/2022  Primary Cardiologist:   [name], MD   Subjective   BP 127/86, Cr stable at 1.24 this morning. Reports continues to have chest pain and pain from leg wound  Inpatient Medications    Scheduled Meds:  atorvastatin  80 mg Oral Daily   Chlorhexidine Gluconate Cloth  6 each Topical Daily   dapagliflozin propanediol  10 mg Oral Daily   furosemide  40 mg Oral Daily   heparin injection (subcutaneous)  5,000 Units Subcutaneous Q8H   insulin aspart  0-20 Units Subcutaneous TID WC   losartan  50 mg Oral Daily   metoprolol succinate  50 mg Oral Daily   mouth rinse  15 mL Mouth Rinse 4 times per day   sodium chloride flush  10 mL Intravenous Q12H   sodium chloride flush  3 mL Intravenous Q12H   spironolactone  12.5 mg Oral Daily   Continuous Infusions:   PRN Meds: acetaminophen **OR** acetaminophen (TYLENOL) oral liquid 160 mg/5 mL **OR** acetaminophen, ALPRAZolam, fentaNYL (SUBLIMAZE) injection, hydrOXYzine, mouth rinse, oxyCODONE, polyethylene glycol, sodium chloride flush   Vital Signs    Vitals:   12/15/22 2042 12/15/22 2359 12/16/22 0425 12/16/22 0500  BP: 106/70 102/76 127/86   Pulse: 76 75 75   Resp: 20 16 11    Temp: 98.6 F (37 C) 98.4 F (36.9 C) 98.7 F (37.1 C)   TempSrc: Oral Oral Oral   SpO2: 91% 92% 93%   Weight: 133.9 kg   133.9 kg  Height:        Intake/Output Summary (Last 24 hours) at 12/16/2022 0841 Last data filed at 12/16/2022 0015 Gross per 24 hour  Intake 680 ml  Output 1870 ml  Net -1190 ml   Filed Weights   12/15/22 0513 12/15/22 2042 12/16/22 0500  Weight: 134.1 kg 133.9 kg 133.9 kg    Telemetry  NSR with PVCs:  Personally Reviewed  ECG    NA - Personally Reviewed  Physical Exam   GEN:     Anxious no acute distress.   Neck: No  JVD Cardiac: RRR, no murmurs, rubs, or gallops.  Respiratory: Clear   to auscultation bilaterally. GI: Soft, nontender,  non-distended, normal bowel sounds  MS:  No edema; large wound  Neuro:   Nonfocal  Psych: Oriented and appropriate    Labs    Chemistry Recent Labs  Lab 12/10/22 0227 12/11/22 0342 12/12/22 0305 12/13/22 0500 12/16/22 0233  NA 136   < > 141 138 138  K 4.0   < > 3.7 4.0 4.6  CL 104   < > 108 104 101  CO2 23   < > 23 24 23   GLUCOSE 108*   < > 126* 125* 115*  BUN 20   < > 17 17 18   CREATININE 1.32*   < > 1.25* 1.06* 1.24*  CALCIUM 8.3*   < > 8.7* 9.2 9.0  PROT 5.5*  --   --   --   --   ALBUMIN 2.4*  --   --   --   --   AST 22  --   --   --   --   ALT 45*  --   --   --   --   ALKPHOS 31*  --   --   --   --   BILITOT 0.8  --   --   --   --  GFRNONAA 47*   < > 50* >60 51*  ANIONGAP 9   < > 10 10 14    < > = values in this interval not displayed.     Hematology Recent Labs  Lab 12/11/22 0342 12/12/22 0305 12/13/22 0500  WBC 6.9 7.0 8.1  RBC 4.54 4.48 4.32  HGB 12.6 12.9 12.4  HCT 38.7 38.7 37.8  MCV 85.2 86.4 87.5  MCH 27.8 28.8 28.7  MCHC 32.6 33.3 32.8  RDW 14.7 14.7 14.9  PLT 185 192 220    Cardiac EnzymesNo results for input(s): "TROPONINI" in the last 168 hours. No results for input(s): "TROPIPOC" in the last 168 hours.   BNPNo results for input(s): "BNP", "PROBNP" in the last 168 hours.   DDimer No results for input(s): "DDIMER" in the last 168 hours.   Radiology    No results found.  Cardiac Studies   Cath above:  NL coronaries.   Right heart pressures OK.    Patient Profile     57 y.o. 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  Echocardiogram 12/07/2022 showed EF 30 to 35%, normal RV function.  Cath 12/09/2022 showed normal coronary arteries, RA 8, RV 47/6, PA 46/21/31, PCWP 19, CI 2.8.  Cardiac MRI 12/12/2022 showed LVEF 31%, RVEF  50%, RV insertion site LGE. -Continue Lasix 40 mg daily -Continue losartan 50 mg daily, Toprol-XL 50 mg daily, spironolactone 12.5 mg daily, Farxiga 10 mg daily  VF arrest: EP consulted, planning ICD once leg wound heals.  Recommend LifeVest in the meantime.  She has been fitted for lifevest  Hyperlipidemia: On atorvastatin 80 mg daily  T2DM: On SSI, Farxiga  Leg wounds: Wound care consulted.  Main complaint is pain management.  ?needs antibiotics for possible cellulitis though was on rocephin on admission.  Will consult TRH for assistance   For questions or updates, please contact [facility] Please consult [email or web address] for contact info under Cardiology/STEMI.   Signed, Little Ishikawa, MD  12/16/2022, 8:41 AM

## 2022-12-16 NOTE — Progress Notes (Signed)
Physical Therapy Treatment Patient Details Name: [name] MRN: [id number] DOB: [birth year] Today's Date: 12/16/2022   History of Present Illness 57 year old female admitted 10/10 after suffering cardiac arrest + bystander CPR and eventual ROSC after 40 minutes. R/L heart cath and coronary angiography 10/14 w/ diagnosis of NSTEMI.  Past medical history is significant for hypertension, hyperlipidemia, CHF with EF 30 to 35% on GDMT, DM, and morbid obesity, arthritis, ADD, OSA on CPAP, SS/back pain, DM2.    PT Comments  Pt reporting significant R chest wall and L knee pain, states "I hurt all over" and is very distracted by this. Pt demonstrating improving activity tolerance, performing repeated transfers and 90 ft ambulation with rollator during session. Pt does require pericare assist during toileting, and continues to require physical assist for mobility at this time. PT explained PT post-acute recommendations, pt seems to not remember this plan but pt's son at bedside confirms d/c plan. PT to continue to follow.      If plan is discharge home, recommend the following: A little help with walking and/or transfers;A little help with bathing/dressing/bathroom;Help with stairs or ramp for entrance   Can travel by private vehicle        Equipment Recommendations  None recommended by PT    Recommendations for Other Services       Precautions / Restrictions Precautions Precautions: Fall Precaution Comments: no lift/pull/push >5# from 10/14 to 10/18 Restrictions Weight Bearing Restrictions: No RUE Weight Bearing: Weight bearing as tolerated     Mobility  Bed Mobility Overal bed mobility: Needs Assistance Bed Mobility: Supine to Sit     Supine to sit: Mod assist     General bed mobility comments: assist for truncal assist, [name] progression to EOB.    Transfers Overall transfer level: Needs assistance Equipment used: Rollator (4 wheels) Transfers: Sit to/from Stand Sit to  Stand: Min assist           General transfer comment: assist for power up, rise, steadying. stand x2, from EOB and toilet.    Ambulation/Gait Ambulation/Gait assistance: Contact guard assist Gait Distance (Feet): 90 Feet Assistive device: Rollator (4 wheels) Gait Pattern/deviations: Step-through pattern, Shuffle, Decreased stride length, Wide base of support, Trunk flexed Gait velocity: decr     General Gait Details: cues for upright posture and rollator proximity, VSS   Stairs             Wheelchair Mobility     Tilt Bed    Modified Rankin (Stroke Patients Only)       Balance Overall balance assessment: Needs assistance Sitting-balance support: No upper extremity supported, Feet supported Sitting balance-Leahy Scale: Fair     Standing balance support: Bilateral upper extremity supported, During functional activity, Reliant on assistive device for balance Standing balance-Leahy Scale: Poor Standing balance comment: reliant on external assist                            Cognition Arousal: Alert Behavior During Therapy: WFL for tasks assessed/performed Overall Cognitive Status: Within Functional Limits for tasks assessed Area of Impairment: Following commands, Memory, Attention                   Current Attention Level: Sustained (highly distractible) Memory: Decreased recall of precautions Following Commands: Follows one step commands consistently, Follows one step commands with increased time Safety/Judgement: Decreased awareness of safety Awareness: Intellectual, Emergent Problem Solving: Slow processing, Requires verbal cues General Comments: requires cues to  stay on task, benefits from decisive cues. Pt states multiple times "I'm not gonna remember"        Exercises      General Comments General comments (skin integrity, edema, etc.): RA throughout with no DOE, HRmax observed 108 bpm      Pertinent Vitals/Pain Pain  Assessment Pain Assessment: Faces Faces Pain Scale: Hurts little more Pain Location: chest when moving, [name] Pain Descriptors / Indicators: Guarding, Grimacing, Moaning Pain Intervention(s): Monitored during session, Repositioned, Limited activity within patient's tolerance    Home Living                          Prior Function            PT Goals (current goals can now be found in the care plan section) Acute Rehab PT Goals Patient Stated Goal: to get stronger, go home PT Goal Formulation: With patient/family Time For Goal Achievement: 12/22/22 Potential to Achieve Goals: Good Progress towards PT goals: Progressing toward goals    Frequency    Min 1X/week      PT Plan      Co-evaluation              AM-PAC PT "6 Clicks" Mobility   Outcome Measure  Help needed turning from your back to your side while in a flat bed without using bedrails?: A Little Help needed moving from lying on your back to sitting on the side of a flat bed without using bedrails?: A Lot Help needed moving to and from a bed to a chair (including a wheelchair)?: A Lot Help needed standing up from a chair using your arms (e.g., wheelchair or bedside chair)?: A Little Help needed to walk in hospital room?: A Little Help needed climbing 3-5 steps with a railing? : Total 6 Click Score: 14    End of Session   Activity Tolerance: Patient tolerated treatment well;Patient limited by fatigue Patient left: in chair;with call bell/phone within reach;with chair alarm set;with family/visitor present Nurse Communication: Mobility status PT Visit Diagnosis: Muscle weakness (generalized) (M62.81);Other abnormalities of gait and mobility (R26.89) Pain - Right/Left: Left Pain - part of body: Knee     Time: 1049-1130 PT Time Calculation (min) (ACUTE ONLY): 41 min  Charges:    $Gait Training: 8-22 mins $Therapeutic Activity: 23-37 mins PT General Charges $$ ACUTE PT VISIT: 1 Visit                      Marye Round, PT DPT Acute Rehabilitation Services Secure Chat Preferred  Office [phone]    Dasean Brow E Christain [place] 12/16/2022, 12:07 PM

## 2022-12-17 ENCOUNTER — Inpatient Hospital Stay (HOSPITAL_COMMUNITY)
Admission: AD | Admit: 2022-12-17 | Discharge: 2022-12-30 | DRG: 945 | Disposition: A | Discharge status: Home Health | Payer: Medicare Other | Source: Intra-hospital | Attending: Physical Medicine & Rehabilitation | Admitting: Physical Medicine & Rehabilitation

## 2022-12-17 ENCOUNTER — Other Ambulatory Visit: Payer: Self-pay

## 2022-12-17 DIAGNOSIS — G931 Anoxic brain damage, not elsewhere classified: Secondary | ICD-10-CM | POA: Diagnosis present

## 2022-12-17 DIAGNOSIS — Z880 Allergy status to penicillin: Secondary | ICD-10-CM

## 2022-12-17 DIAGNOSIS — M79601 Pain in right arm: Secondary | ICD-10-CM | POA: Diagnosis not present

## 2022-12-17 DIAGNOSIS — E872 Acidosis, unspecified: Secondary | ICD-10-CM | POA: Diagnosis present

## 2022-12-17 DIAGNOSIS — E1122 Type 2 diabetes mellitus with diabetic chronic kidney disease: Secondary | ICD-10-CM | POA: Diagnosis present

## 2022-12-17 DIAGNOSIS — Z79899 Other long term (current) drug therapy: Secondary | ICD-10-CM

## 2022-12-17 DIAGNOSIS — R5381 Other malaise: Secondary | ICD-10-CM | POA: Diagnosis not present

## 2022-12-17 DIAGNOSIS — M47812 Spondylosis without myelopathy or radiculopathy, cervical region: Secondary | ICD-10-CM | POA: Diagnosis not present

## 2022-12-17 DIAGNOSIS — F332 Major depressive disorder, recurrent severe without psychotic features: Secondary | ICD-10-CM | POA: Diagnosis present

## 2022-12-17 DIAGNOSIS — M47816 Spondylosis without myelopathy or radiculopathy, lumbar region: Secondary | ICD-10-CM | POA: Diagnosis present

## 2022-12-17 DIAGNOSIS — B3731 Acute candidiasis of vulva and vagina: Secondary | ICD-10-CM | POA: Diagnosis present

## 2022-12-17 DIAGNOSIS — Z95810 Presence of automatic (implantable) cardiac defibrillator: Secondary | ICD-10-CM

## 2022-12-17 DIAGNOSIS — S81802D Unspecified open wound, left lower leg, subsequent encounter: Secondary | ICD-10-CM | POA: Diagnosis not present

## 2022-12-17 DIAGNOSIS — Z8249 Family history of ischemic heart disease and other diseases of the circulatory system: Secondary | ICD-10-CM | POA: Diagnosis not present

## 2022-12-17 DIAGNOSIS — R6883 Chills (without fever): Secondary | ICD-10-CM | POA: Diagnosis not present

## 2022-12-17 DIAGNOSIS — I4901 Ventricular fibrillation: Secondary | ICD-10-CM | POA: Diagnosis present

## 2022-12-17 DIAGNOSIS — Z794 Long term (current) use of insulin: Secondary | ICD-10-CM | POA: Diagnosis not present

## 2022-12-17 DIAGNOSIS — R0789 Other chest pain: Secondary | ICD-10-CM | POA: Diagnosis present

## 2022-12-17 DIAGNOSIS — M25562 Pain in left knee: Secondary | ICD-10-CM | POA: Diagnosis not present

## 2022-12-17 DIAGNOSIS — I152 Hypertension secondary to endocrine disorders: Secondary | ICD-10-CM | POA: Diagnosis present

## 2022-12-17 DIAGNOSIS — I5022 Chronic systolic (congestive) heart failure: Secondary | ICD-10-CM | POA: Diagnosis present

## 2022-12-17 DIAGNOSIS — G4733 Obstructive sleep apnea (adult) (pediatric): Secondary | ICD-10-CM | POA: Diagnosis present

## 2022-12-17 DIAGNOSIS — Z7985 Long-term (current) use of injectable non-insulin antidiabetic drugs: Secondary | ICD-10-CM

## 2022-12-17 DIAGNOSIS — R200 Anesthesia of skin: Secondary | ICD-10-CM | POA: Diagnosis not present

## 2022-12-17 DIAGNOSIS — E785 Hyperlipidemia, unspecified: Secondary | ICD-10-CM | POA: Diagnosis present

## 2022-12-17 DIAGNOSIS — Z8674 Personal history of sudden cardiac arrest: Secondary | ICD-10-CM

## 2022-12-17 DIAGNOSIS — I959 Hypotension, unspecified: Secondary | ICD-10-CM | POA: Diagnosis not present

## 2022-12-17 DIAGNOSIS — R6884 Jaw pain: Secondary | ICD-10-CM | POA: Diagnosis not present

## 2022-12-17 DIAGNOSIS — I428 Other cardiomyopathies: Secondary | ICD-10-CM | POA: Diagnosis present

## 2022-12-17 DIAGNOSIS — S81802A Unspecified open wound, left lower leg, initial encounter: Secondary | ICD-10-CM | POA: Diagnosis not present

## 2022-12-17 DIAGNOSIS — E1152 Type 2 diabetes mellitus with diabetic peripheral angiopathy with gangrene: Secondary | ICD-10-CM | POA: Diagnosis present

## 2022-12-17 DIAGNOSIS — E1165 Type 2 diabetes mellitus with hyperglycemia: Secondary | ICD-10-CM | POA: Diagnosis present

## 2022-12-17 DIAGNOSIS — N189 Chronic kidney disease, unspecified: Secondary | ICD-10-CM | POA: Diagnosis not present

## 2022-12-17 DIAGNOSIS — K5900 Constipation, unspecified: Secondary | ICD-10-CM | POA: Diagnosis not present

## 2022-12-17 DIAGNOSIS — M25561 Pain in right knee: Secondary | ICD-10-CM | POA: Diagnosis present

## 2022-12-17 DIAGNOSIS — Z87891 Personal history of nicotine dependence: Secondary | ICD-10-CM

## 2022-12-17 DIAGNOSIS — E876 Hypokalemia: Secondary | ICD-10-CM | POA: Diagnosis present

## 2022-12-17 DIAGNOSIS — J301 Allergic rhinitis due to pollen: Secondary | ICD-10-CM | POA: Diagnosis present

## 2022-12-17 DIAGNOSIS — L089 Local infection of the skin and subcutaneous tissue, unspecified: Secondary | ICD-10-CM | POA: Diagnosis not present

## 2022-12-17 DIAGNOSIS — E1169 Type 2 diabetes mellitus with other specified complication: Secondary | ICD-10-CM | POA: Diagnosis present

## 2022-12-17 DIAGNOSIS — I5042 Chronic combined systolic (congestive) and diastolic (congestive) heart failure: Secondary | ICD-10-CM | POA: Diagnosis present

## 2022-12-17 DIAGNOSIS — I469 Cardiac arrest, cause unspecified: Secondary | ICD-10-CM | POA: Diagnosis not present

## 2022-12-17 DIAGNOSIS — R45851 Suicidal ideations: Secondary | ICD-10-CM | POA: Diagnosis present

## 2022-12-17 DIAGNOSIS — R739 Hyperglycemia, unspecified: Secondary | ICD-10-CM | POA: Diagnosis present

## 2022-12-17 DIAGNOSIS — Z6841 Body Mass Index (BMI) 40.0 and over, adult: Secondary | ICD-10-CM

## 2022-12-17 DIAGNOSIS — F430 Acute stress reaction: Secondary | ICD-10-CM | POA: Clinically undetermined

## 2022-12-17 DIAGNOSIS — Z91014 Allergy to mammalian meats: Secondary | ICD-10-CM

## 2022-12-17 DIAGNOSIS — F988 Other specified behavioral and emotional disorders with onset usually occurring in childhood and adolescence: Secondary | ICD-10-CM | POA: Diagnosis present

## 2022-12-17 DIAGNOSIS — R682 Dry mouth, unspecified: Secondary | ICD-10-CM | POA: Diagnosis not present

## 2022-12-17 DIAGNOSIS — Z818 Family history of other mental and behavioral disorders: Secondary | ICD-10-CM

## 2022-12-17 DIAGNOSIS — M1712 Unilateral primary osteoarthritis, left knee: Secondary | ICD-10-CM | POA: Diagnosis not present

## 2022-12-17 DIAGNOSIS — T148XXA Other injury of unspecified body region, initial encounter: Secondary | ICD-10-CM | POA: Diagnosis not present

## 2022-12-17 DIAGNOSIS — F909 Attention-deficit hyperactivity disorder, unspecified type: Secondary | ICD-10-CM | POA: Diagnosis present

## 2022-12-17 DIAGNOSIS — I878 Other specified disorders of veins: Secondary | ICD-10-CM | POA: Diagnosis not present

## 2022-12-17 DIAGNOSIS — F4323 Adjustment disorder with mixed anxiety and depressed mood: Secondary | ICD-10-CM | POA: Clinically undetermined

## 2022-12-17 DIAGNOSIS — F411 Generalized anxiety disorder: Secondary | ICD-10-CM | POA: Diagnosis not present

## 2022-12-17 DIAGNOSIS — Z888 Allergy status to other drugs, medicaments and biological substances status: Secondary | ICD-10-CM

## 2022-12-17 DIAGNOSIS — E119 Type 2 diabetes mellitus without complications: Secondary | ICD-10-CM | POA: Diagnosis not present

## 2022-12-17 DIAGNOSIS — Z833 Family history of diabetes mellitus: Secondary | ICD-10-CM

## 2022-12-17 DIAGNOSIS — M25511 Pain in right shoulder: Secondary | ICD-10-CM | POA: Diagnosis not present

## 2022-12-17 DIAGNOSIS — G8929 Other chronic pain: Secondary | ICD-10-CM | POA: Diagnosis present

## 2022-12-17 DIAGNOSIS — N1831 Chronic kidney disease, stage 3a: Secondary | ICD-10-CM | POA: Diagnosis not present

## 2022-12-17 DIAGNOSIS — F419 Anxiety disorder, unspecified: Secondary | ICD-10-CM | POA: Diagnosis present

## 2022-12-17 LAB — GLUCOSE, CAPILLARY
Glucose-Capillary: 134 mg/dL — ABNORMAL HIGH (ref 70–99)
Glucose-Capillary: 169 mg/dL — ABNORMAL HIGH (ref 70–99)
Glucose-Capillary: 109 mg/dL — ABNORMAL HIGH (ref 70–99)
Glucose-Capillary: 123 mg/dL — ABNORMAL HIGH (ref 70–99)

## 2022-12-17 LAB — BASIC METABOLIC PANEL
Anion gap: 10 (ref 5–15)
BUN: 17 mg/dL (ref 6–20)
CO2: 28 mmol/L (ref 22–32)
Calcium: 8.8 mg/dL — ABNORMAL LOW (ref 8.9–10.3)
Chloride: 99 mmol/L (ref 98–111)
Creatinine, Ser: 1.01 mg/dL — ABNORMAL HIGH (ref 0.44–1.00)
GFR, Estimated: >60 mL/min (ref >60)
Glucose, Bld: 119 mg/dL — ABNORMAL HIGH (ref 70–99)
Potassium: 3.8 mmol/L (ref 3.5–5.1)
Sodium: 137 mmol/L (ref 135–145)

## 2022-12-17 MED ORDER — DAPAGLIFLOZIN PROPANEDIOL 10 MG PO TABS: 10.0000 mg | ORAL_TABLET | Freq: Every day | ORAL | Status: DC

## 2022-12-17 MED ORDER — INSULIN ASPART 100 UNIT/ML IJ SOLN
0.0000 [IU] | Freq: Three times a day (TID) | INTRAMUSCULAR | Status: DC
  Administered 2022-12-18: 1 [IU] via SUBCUTANEOUS
  Administered 2022-12-20: 2 [IU] via SUBCUTANEOUS
  Administered 2022-12-21: 1 [IU] via SUBCUTANEOUS
  Administered 2022-12-22: 2 [IU] via SUBCUTANEOUS
  Administered 2022-12-22: 3 [IU] via SUBCUTANEOUS
  Administered 2022-12-22: 2 [IU] via SUBCUTANEOUS
  Administered 2022-12-23 – 2022-12-26 (×5): 1 [IU] via SUBCUTANEOUS
  Administered 2022-12-28: 2 [IU] via SUBCUTANEOUS
  Administered 2022-12-29: 1 [IU] via SUBCUTANEOUS

## 2022-12-17 MED ORDER — ALUM & MAG HYDROXIDE-SIMETH 200-200-20 MG/5ML PO SUSP
30.0000 mL | ORAL | Status: DC | PRN
Start: 2022-12-17 — End: 2022-12-30

## 2022-12-17 MED ORDER — BENEPROTEIN PO POWD
1.0000 | Freq: Three times a day (TID) | ORAL | Status: DC
  Administered 2022-12-18 – 2022-12-25 (×8): 6 g via ORAL
  Filled 2022-12-17: qty 227

## 2022-12-17 MED ORDER — METOPROLOL SUCCINATE ER 50 MG PO TB24
50.0000 mg | ORAL_TABLET | Freq: Every day | ORAL | Status: DC
  Administered 2022-12-19 – 2022-12-30 (×12): 50 mg via ORAL
  Filled 2022-12-17 (×13): qty 1

## 2022-12-17 MED ORDER — FUROSEMIDE 40 MG PO TABS
40.0000 mg | ORAL_TABLET | Freq: Every day | ORAL | Status: DC
  Administered 2022-12-18 – 2022-12-30 (×13): 40 mg via ORAL
  Filled 2022-12-17 (×13): qty 1

## 2022-12-17 MED ORDER — ALPRAZOLAM 0.5 MG PO TABS
0.5000 mg | ORAL_TABLET | Freq: Two times a day (BID) | ORAL | Status: DC | PRN
  Administered 2022-12-20 – 2022-12-28 (×10): 0.5 mg via ORAL
  Filled 2022-12-17 (×12): qty 1

## 2022-12-17 MED ORDER — PROCHLORPERAZINE 25 MG RE SUPP: 12.5000 mg | Freq: Four times a day (QID) | RECTAL | Status: DC | PRN

## 2022-12-17 MED ORDER — POLYETHYLENE GLYCOL 3350 17 G PO PACK
17.0000 g | PACK | Freq: Every day | ORAL | Status: DC | PRN
  Administered 2022-12-19 – 2022-12-22 (×2): 17 g via ORAL
  Filled 2022-12-17 (×3): qty 1

## 2022-12-17 MED ORDER — VITAMIN C 500 MG PO TABS
500.0000 mg | ORAL_TABLET | Freq: Every day | ORAL | Status: DC
  Administered 2022-12-17 – 2022-12-30 (×14): 500 mg via ORAL
  Filled 2022-12-17 (×14): qty 1

## 2022-12-17 MED ORDER — ATORVASTATIN CALCIUM 80 MG PO TABS
80.0000 mg | ORAL_TABLET | Freq: Every day | ORAL | Status: DC
  Administered 2022-12-18 – 2022-12-30 (×13): 80 mg via ORAL
  Filled 2022-12-17 (×13): qty 1

## 2022-12-17 MED ORDER — DIPHENHYDRAMINE HCL 25 MG PO CAPS: 25.0000 mg | ORAL_CAPSULE | Freq: Four times a day (QID) | ORAL | Status: DC | PRN

## 2022-12-17 MED ORDER — PROCHLORPERAZINE MALEATE 5 MG PO TABS: 5.0000 mg | ORAL_TABLET | Freq: Four times a day (QID) | ORAL | Status: DC | PRN

## 2022-12-17 MED ORDER — ORAL CARE MOUTH RINSE: 15.0000 mL | OROMUCOSAL | Status: DC | PRN

## 2022-12-17 MED ORDER — HYDROXYZINE HCL 10 MG PO TABS
10.0000 mg | ORAL_TABLET | Freq: Four times a day (QID) | ORAL | Status: DC | PRN
  Administered 2022-12-23 – 2022-12-29 (×4): 10 mg via ORAL
  Filled 2022-12-17 (×6): qty 1

## 2022-12-17 MED ORDER — PROCHLORPERAZINE EDISYLATE 10 MG/2ML IJ SOLN: 5.0000 mg | Freq: Four times a day (QID) | INTRAMUSCULAR | Status: DC | PRN

## 2022-12-17 MED ORDER — SILVER SULFADIAZINE 1 % EX CREA
TOPICAL_CREAM | Freq: Two times a day (BID) | CUTANEOUS | Status: DC
  Administered 2022-12-22: 2 via TOPICAL
  Filled 2022-12-17: qty 85

## 2022-12-17 MED ORDER — INSULIN ASPART 100 UNIT/ML IJ SOLN: 0.0000 [IU] | Freq: Three times a day (TID) | INTRAMUSCULAR | Status: DC

## 2022-12-17 MED ORDER — CLOTRIMAZOLE 1 % VA CREA
1.0000 | TOPICAL_CREAM | Freq: Every day | VAGINAL | Status: AC
  Administered 2022-12-17 – 2022-12-20 (×4): 1 via VAGINAL
  Filled 2022-12-17: qty 45

## 2022-12-17 MED ORDER — SILVER SULFADIAZINE 1 % EX CREA
TOPICAL_CREAM | Freq: Two times a day (BID) | CUTANEOUS | Status: DC
  Filled 2022-12-17: qty 85

## 2022-12-17 MED ORDER — ENOXAPARIN SODIUM 40 MG/0.4ML IJ SOSY
40.0000 mg | PREFILLED_SYRINGE | INTRAMUSCULAR | Status: DC
  Administered 2022-12-17 – 2022-12-19 (×3): 40 mg via SUBCUTANEOUS
  Filled 2022-12-17 (×3): qty 0.4

## 2022-12-17 MED ORDER — FLEET ENEMA RE ENEM: 1.0000 | ENEMA | Freq: Once | RECTAL | Status: DC | PRN

## 2022-12-17 MED ORDER — CHLORHEXIDINE GLUCONATE CLOTH 2 % EX PADS: 6.0000 | MEDICATED_PAD | Freq: Every day | CUTANEOUS | Status: DC

## 2022-12-17 MED ORDER — ACETAMINOPHEN 325 MG PO TABS
325.0000 mg | ORAL_TABLET | ORAL | Status: DC | PRN
  Administered 2022-12-18 – 2022-12-22 (×4): 650 mg via ORAL
  Administered 2022-12-26 – 2022-12-27 (×2): 325 mg via ORAL
  Filled 2022-12-17 (×7): qty 2

## 2022-12-17 MED ORDER — TRAZODONE HCL 50 MG PO TABS
25.0000 mg | ORAL_TABLET | Freq: Every evening | ORAL | Status: DC | PRN
  Administered 2022-12-23 – 2022-12-28 (×2): 50 mg via ORAL
  Filled 2022-12-17 (×2): qty 1

## 2022-12-17 MED ORDER — METHYLPHENIDATE HCL 5 MG PO TABS
5.0000 mg | ORAL_TABLET | Freq: Two times a day (BID) | ORAL | Status: DC
  Administered 2022-12-18 – 2022-12-30 (×26): 5 mg via ORAL
  Filled 2022-12-17 (×26): qty 1

## 2022-12-17 MED ORDER — GABAPENTIN 100 MG PO CAPS
100.0000 mg | ORAL_CAPSULE | Freq: Three times a day (TID) | ORAL | Status: DC
  Administered 2022-12-17 – 2022-12-19 (×5): 100 mg via ORAL
  Filled 2022-12-17 (×5): qty 1

## 2022-12-17 MED ORDER — INSULIN ASPART 100 UNIT/ML IJ SOLN: 0.0000 [IU] | Freq: Every day | INTRAMUSCULAR | Status: DC

## 2022-12-17 MED ORDER — ATORVASTATIN CALCIUM 80 MG PO TABS: 80.0000 mg | ORAL_TABLET | Freq: Every day | ORAL | Status: DC

## 2022-12-17 MED ORDER — GUAIFENESIN-DM 100-10 MG/5ML PO SYRP
5.0000 mL | ORAL_SOLUTION | Freq: Four times a day (QID) | ORAL | Status: DC | PRN
Start: 2022-12-17 — End: 2022-12-30

## 2022-12-17 MED ORDER — BISACODYL 10 MG RE SUPP: 10.0000 mg | Freq: Every day | RECTAL | Status: DC | PRN

## 2022-12-17 MED ORDER — METOPROLOL SUCCINATE ER 50 MG PO TB24: 50.0000 mg | ORAL_TABLET | Freq: Every day | ORAL | Status: DC

## 2022-12-17 MED ORDER — SPIRONOLACTONE 25 MG PO TABS: 12.5000 mg | ORAL_TABLET | Freq: Every day | ORAL | Status: DC

## 2022-12-17 MED ORDER — LOSARTAN POTASSIUM 50 MG PO TABS: 50.0000 mg | ORAL_TABLET | Freq: Every day | ORAL | Status: DC

## 2022-12-17 MED ORDER — DICLOFENAC SODIUM 1 % EX GEL
2.0000 g | Freq: Four times a day (QID) | CUTANEOUS | Status: DC
  Administered 2022-12-17 – 2022-12-29 (×37): 2 g via TOPICAL
  Filled 2022-12-17: qty 100

## 2022-12-17 MED ORDER — ALPRAZOLAM 0.5 MG PO TABS: 0.5000 mg | ORAL_TABLET | Freq: Two times a day (BID) | ORAL | Status: DC | PRN

## 2022-12-17 MED ORDER — OXYCODONE HCL 5 MG PO TABS: 5.0000 mg | ORAL_TABLET | ORAL | Status: DC | PRN

## 2022-12-17 MED ORDER — JUVEN PO PACK
1.0000 | PACK | Freq: Two times a day (BID) | ORAL | Status: DC
  Administered 2022-12-18 – 2022-12-30 (×18): 1 via ORAL
  Filled 2022-12-17 (×16): qty 1

## 2022-12-17 MED ORDER — METFORMIN HCL 500 MG PO TABS
250.0000 mg | ORAL_TABLET | Freq: Two times a day (BID) | ORAL | Status: DC
  Administered 2022-12-18 – 2022-12-30 (×25): 250 mg via ORAL
  Filled 2022-12-17 (×25): qty 1

## 2022-12-17 MED ORDER — DAPAGLIFLOZIN PROPANEDIOL 10 MG PO TABS
10.0000 mg | ORAL_TABLET | Freq: Every day | ORAL | Status: DC
  Administered 2022-12-18 – 2022-12-30 (×13): 10 mg via ORAL
  Filled 2022-12-17 (×13): qty 1

## 2022-12-17 MED ORDER — FUROSEMIDE 40 MG PO TABS: 40.0000 mg | ORAL_TABLET | Freq: Every day | ORAL | Status: DC

## 2022-12-17 MED ORDER — LOSARTAN POTASSIUM 50 MG PO TABS
50.0000 mg | ORAL_TABLET | Freq: Every day | ORAL | Status: DC
  Administered 2022-12-19 – 2022-12-20 (×2): 50 mg via ORAL
  Filled 2022-12-17 (×3): qty 1

## 2022-12-17 MED ORDER — OXYCODONE HCL 5 MG PO TABS
5.0000 mg | ORAL_TABLET | ORAL | Status: DC | PRN
  Administered 2022-12-17 – 2022-12-22 (×15): 5 mg via ORAL
  Filled 2022-12-17 (×15): qty 1

## 2022-12-17 MED ORDER — SPIRONOLACTONE 12.5 MG HALF TABLET
12.5000 mg | ORAL_TABLET | Freq: Every day | ORAL | Status: DC
  Administered 2022-12-18 – 2022-12-30 (×13): 12.5 mg via ORAL
  Filled 2022-12-17 (×13): qty 1

## 2022-12-17 NOTE — Progress Notes (Signed)
   12/16/22 2328  BiPAP/CPAP/SIPAP  Reason BIPAP/CPAP not in use Non-compliant (refused)  BiPAP/CPAP /SiPAP Vitals  Resp 16  MEWS Score/Color  MEWS Score 0  MEWS Score Color Green

## 2022-12-17 NOTE — Consult Note (Signed)
[facility]  Reason for Consult: Left lower extremity wound Referring Physician: Dr. Norma Fredrickson is an 57 y.o. female.  HPI: 57 year old female with a past medical history of postpartum cardiomyopathy, history of CHF with EF 30 to 35%, DM, morbid obesity who presented to [facility] ED on 12/05/2022 via EMS for cardiac arrest.  She was admitted to the hospital, initially in the ICU and intubated.  She is now extubated. She did receive intraosseous fluids with subsequent skin changes involving the left lower extremity and resulting left lower extremity wound and some skin necrosis.  There was initially some concern for compartment syndrome and she was evaluated by Ortho on 12/08/2022.  Patient reports today that she "feels as if a truck hit her".  She reports upper extremity soreness, attributes this to ambulating with a walker, changing positions in bed with use of her upper extremities.  She reports that she has been receiving dressing changes to her left lower extremity, she has been avoiding looking at her left leg.    She does not report any pain to her left lower extremity today.  She does report chronic lower extremity swelling, left greater than right.   Patient has been receiving dressing changes with Xeroform daily.   Past Medical History:  Diagnosis Date   ADD (attention deficit disorder)    Anxiety    Arthritis    both knees right worse than left   Carpal tunnel syndrome of right wrist    Depression    Essential hypertension, benign    Gallstones    History of smoking 06/22/2016   Hyperlipidemia associated with type 2 diabetes mellitus (HCC), on Zocor 04/04/2011   Hypertension associated with diabetes (HCC) 06/03/2008   Migraines    Morbid obesity (HCC)    Morbid obesity with BMI of 50.0-59.9, adult (HCC) 07/12/2006   NICM (nonischemic cardiomyopathy) (HCC) 07/12/2006   01/16/18 ECHO:    - Procedure narrative: Transthoracic echocardiography. Image    quality was suboptimal. The study was technically difficult.   Intravenous contrast (Definity) was administered. - Left ventricle: The cavity size was moderately dilated. Wall   thickness was increased in a pattern of mild LVH. Systolic   function was moderately to severely reduced. The estimated   ejection fraction w   Normal coronary arteries 04/17/2016   OSA on CPAP 10/28/2014   Spinal stenosis    back pain   Spondylosis without myelopathy or radiculopathy, lumbar region 12/04/2017   Type 2 diabetes mellitus with hyperglycemia [facility])     Past Surgical History:  Procedure Laterality Date   CHOLECYSTECTOMY     DILATATION & CURETTAGE/HYSTEROSCOPY WITH MYOSURE N/A 10/31/2017   Procedure: DILATATION & CURETTAGE/HYSTEROSCOPY WITH MYOSURE;  Surgeon: Romualdo Bolk, MD;  Location: [facility];  Service: Gynecology;  Laterality: N/A;   HYSTEROSCOPY WITH D & C N/A 05/07/2022   Procedure: DILATATION AND CURETTAGE /HYSTEROSCOPY;  Surgeon: [name], MD;  Location: [facility] OR;  Service: Gynecology;  Laterality: N/A;   RIGHT/LEFT HEART CATH AND CORONARY ANGIOGRAPHY N/A 04/11/2016   Procedure: Right/Left Heart Cath and Coronary Angiography;  Surgeon: Kathleene Hazel, MD;  Location: [facility] INVASIVE CV LAB;  Service: Cardiovascular;  Laterality: N/A;   RIGHT/LEFT HEART CATH AND CORONARY ANGIOGRAPHY N/A 12/09/2022   Procedure: RIGHT/LEFT HEART CATH AND CORONARY ANGIOGRAPHY;  Surgeon: Yates Decamp, MD;  Location: [facility] INVASIVE CV LAB;  Service: Cardiovascular;  Laterality: N/A;   sonogram for blood clots     no blockages  Family History  Problem Relation Age of Onset   Depression Mother    Anxiety disorder Mother    Diabetes Mother    Hypertension Mother    ADD / ADHD Father    Diabetes Father    Hypertension Father    Colitis Father    Hypertension Other    Diabetes Other    Colitis Other    Alcohol abuse Other     Social History:  reports that she quit smoking about 25 years ago. Her smoking  use included cigarettes. She has never used smokeless tobacco. She reports that she does not drink alcohol and does not use drugs.  Allergies:  Allergies  Allergen Reactions   Fluoxetine Other (See Comments)    More depressed   Cymbalta [Duloxetine Hcl] Other (See Comments)    depressed   Pork-Derived Products    Penicillins Rash    Has patient had a PCN reaction causing immediate rash, facial/tongue/throat swelling, SOB or lightheadedness with hypotension: YES Has patient had a PCN reaction causing severe rash involving mucus membranes or skin necrosis: NO Has patient had a PCN reaction that required hospitalization: YES Has patient had a PCN reaction occurring within the last 10 years: NO If all of the above answers are "NO", then may proceed with Cephalosporin use.     Medications: I have reviewed the patient's current medications.  Results for orders placed or performed during the hospital encounter of 12/05/22 (from the past 48 hour(s))  Glucose, capillary     Status: Abnormal   Collection Time: 12/15/22  4:38 PM  Result Value Ref Range   Glucose-Capillary 122 (H) 70 - 99 mg/dL    Comment: Glucose reference range applies only to samples taken after fasting for at least 8 hours.   Comment 1 Notify RN    Comment 2 Document in Chart   Basic metabolic panel     Status: Abnormal   Collection Time: 12/16/22  2:33 AM  Result Value Ref Range   Sodium 138 135 - 145 mmol/L   Potassium 4.6 3.5 - 5.1 mmol/L    Comment: HEMOLYSIS AT THIS LEVEL MAY AFFECT RESULT   Chloride 101 98 - 111 mmol/L   CO2 23 22 - 32 mmol/L   Glucose, Bld 115 (H) 70 - 99 mg/dL    Comment: Glucose reference range applies only to samples taken after fasting for at least 8 hours.   BUN 18 6 - 20 mg/dL   Creatinine, Ser 7.25 (H) 0.44 - 1.00 mg/dL   Calcium 9.0 8.9 - 36.6 mg/dL   GFR, Estimated 51 (L) >60 mL/min    Comment: (NOTE) Calculated using the CKD-EPI Creatinine Equation (2021)    Anion gap 14 5 - 15     Comment: Performed at [facility], [address] [place], [place] [address]  Magnesium     Status: None   Collection Time: 12/16/22  2:33 AM  Result Value Ref Range   Magnesium 1.8 1.7 - 2.4 mg/dL    Comment: Performed at [facility], [address] [place], [place] [address]  Glucose, capillary     Status: Abnormal   Collection Time: 12/16/22  6:36 AM  Result Value Ref Range   Glucose-Capillary 123 (H) 70 - 99 mg/dL    Comment: Glucose reference range applies only to samples taken after fasting for at least 8 hours.  Glucose, capillary     Status: Abnormal   Collection Time: 12/16/22 11:42 AM  Result Value Ref Range   Glucose-Capillary 136 (H) 70 - 99 mg/dL    Comment: Glucose reference range applies only to samples taken after fasting for at least 8 hours.  Glucose, capillary     Status: Abnormal   Collection Time: 12/16/22  4:20 PM  Result Value Ref Range   Glucose-Capillary 121 (H) 70 - 99 mg/dL    Comment: Glucose reference range applies only to samples taken after fasting for at least 8 hours.  C-reactive protein     Status: Abnormal   Collection Time: 12/16/22  6:20 PM  Result Value Ref Range   CRP 2.8 (H) <1.0 mg/dL    Comment: Performed at [facility], [address] [place], [place] [address]  Basic metabolic panel     Status: Abnormal   Collection Time: 12/17/22  4:20 AM  Result Value Ref Range   Sodium 137 135 - 145 mmol/L   Potassium 3.8 3.5 - 5.1 mmol/L   Chloride 99 98 - 111 mmol/L   CO2 28 22 - 32 mmol/L   Glucose, Bld 119 (H) 70 - 99 mg/dL    Comment: Glucose reference range applies only to samples taken after fasting for at least 8 hours.   BUN 17 6 - 20 mg/dL   Creatinine, Ser 7.82 (H) 0.44 - 1.00 mg/dL   Calcium 8.8 (L) 8.9 - 10.3 mg/dL   GFR, Estimated >95 >62 mL/min    Comment: (NOTE) Calculated using the CKD-EPI Creatinine Equation (2021)    Anion gap 10 5 - 15    Comment: Performed at [facility], [address] [place], [place] [address]  Glucose, capillary     Status: Abnormal   Collection Time: 12/17/22  6:14 AM  Result Value Ref Range   Glucose-Capillary 134 (H) 70 - 99 mg/dL    Comment: Glucose reference range applies only to samples taken after fasting for at least 8 hours.  Glucose, capillary     Status: Abnormal   Collection Time: 12/17/22 11:25 AM  Result Value Ref Range   Glucose-Capillary 169 (H) 70 - 99 mg/dL    Comment: Glucose reference range applies only to samples taken after fasting for at least 8 hours.    No results found.  Review of Systems  Constitutional:  Positive for malaise/fatigue. Negative for chills and fever.  Cardiovascular:  Positive for leg swelling. Negative for chest pain.  Gastrointestinal: Negative.    Blood pressure 100/75, pulse 74, temperature 98 F (36.7 C), temperature source Oral, resp. rate 19, height 5\' 4"  (1.626 m), weight 133.5 kg, SpO2 93%. Physical Exam Constitutional:      General: She is not in acute distress.    Appearance: Normal appearance. She is obese. She is ill-appearing. She is not toxic-appearing or diaphoretic.  HENT:     Head: Normocephalic and atraumatic.  Pulmonary:     Effort: Pulmonary effort is normal.     Breath sounds: Normal breath sounds.  Musculoskeletal:        General: Swelling and tenderness (Mild) present.     Left lower leg: Edema present.  Skin:    General: Skin is warm and dry.       Neurological:     General: No focal deficit present.     Mental Status: She is alert and oriented to person, place, and time.  Psychiatric:        Mood and Affect: Mood normal.        Behavior:  Behavior normal.    Left lower extremity: Left lower extremity wound present extending from the distal thigh to the mid anterior shin.  The wound does have the appearance of a burn/infiltration.  There is some eschar present in the proximal area as well as exposed dermis on the anterior lateral aspect extending inferiorly.  She  does have some blistering and sloughing skin present.  The eschar is soft with palpation, she does have some mild tenderness in this area, but no significant pain noted.  Compartments are soft, no reproducible pain noted.  She does have some lower extremity edema and peripheral wound edema present.  There is no surrounding cellulitic changes.  There is no crepitus noted.  I do not appreciate any active drainage at this time.  Marking lines are noted.           Assessment/Plan: Left lower extremity wound extending from distal left thigh to mid shin, overlying the left knee:  57 year old female with a left lower extremity wound she developed after interosseous access.  She has been receiving dressing changes with Xeroform.  She does have some eschar present particularly in the proximal area overlying the knee, the eschar is soft and there is no appreciation for subcutaneous fluid.  Discussed patient's case with Dr. Ladona Ridgel today, she does have some eschar present as well as some areas of dermis exposed.  Recommend twice daily dressing changes with Silvadene to left lower extremity wound, cover this with ABD pads, Kerlix.  We will continue to closely monitor for softening of the eschar over the proximal wound and switched to Vaseline, Xeroform, ABD, Kerlix after 2 to 3 days of Silvadene.  Will update orders.  Discussed with patient that surgical intervention may be necessary, however will continue with local wound care initially and closely follow for improvement.  Discussed with patient she may require debridement, application of wound matrix in the future pending improvement with local wound care.  Patient was agreeable to this plan.  All of her questions were answered to her content.  Pictures were obtained of the patient and placed in the chart with the patient's or guardian's permission.   Kermit Balo Angus Amini, PA-C 12/17/2022, 1:25 PM

## 2022-12-17 NOTE — Progress Notes (Signed)
Occupational Therapy Treatment Patient Details Name: [name] MRN: [id number] DOB: [birth year] Today's Date: 12/17/2022   History of present illness 57 year old female admitted 10/10 after suffering cardiac arrest + bystander CPR and eventual ROSC after 40 minutes. R/L heart cath and coronary angiography 10/14 w/ diagnosis of NSTEMI.  Past medical history is significant for hypertension, hyperlipidemia, CHF with EF 30 to 35% on GDMT, DM, and morbid obesity, arthritis, ADD, OSA on CPAP, SS/back pain, DM2.   OT comments  Pt with slow, steady progress toward established OT goals. Pt performing seated grooming tasks with set-up assist. Focus of session on activity tolerance and OOB mobility. Pt needing multimodal cues for safety and sequencing during bed mobility as well as min A. Able to perform functional mobility this session within room and back to chair. Due to significant change in functional status, recommending intensive multidisciplinary rehabilitation >3 hours/day to optimize safety and independence in ADL.        If plan is discharge home, recommend the following:  Two people to help with walking and/or transfers;A lot of help with bathing/dressing/bathroom;Assistance with cooking/housework;Direct supervision/assist for medications management;Assist for transportation;Direct supervision/assist for financial management;Help with stairs or ramp for entrance   Equipment Recommendations  Other (comment) (defer)    Recommendations for Other Services Rehab consult    Precautions / Restrictions Precautions Precautions: Fall       Mobility Bed Mobility Overal bed mobility: Needs Assistance Bed Mobility: Supine to Sit     Supine to sit: Min assist     General bed mobility comments: for truncal elevation. Significant cueing for technique    Transfers Overall transfer level: Needs assistance Equipment used: Rollator (4 wheels) Transfers: Sit to/from Stand Sit to Stand: Min  assist           General transfer comment: assist for power up, rise, steadying.     Balance Overall balance assessment: Needs assistance Sitting-balance support: No upper extremity supported, Feet supported Sitting balance-Leahy Scale: Fair     Standing balance support: Bilateral upper extremity supported, During functional activity, Reliant on assistive device for balance Standing balance-Leahy Scale: Poor Standing balance comment: reliant on external assist                           ADL either performed or assessed with clinical judgement   ADL Overall ADL's : Needs assistance/impaired     Grooming: Set up;Sitting                   Toilet Transfer: Minimal assistance;Rollator (4 wheels);Ambulation           Functional mobility during ADLs: Minimal assistance;Rollator (4 wheels)      Extremity/Trunk Assessment Upper Extremity Assessment Upper Extremity Assessment: Generalized weakness   Lower Extremity Assessment Lower Extremity Assessment: Defer to PT evaluation        Vision   Vision Assessment?: No apparent visual deficits   Perception     Praxis      Cognition Arousal: Alert Behavior During Therapy: WFL for tasks assessed/performed Overall Cognitive Status: Impaired/Different from baseline Area of Impairment: Following commands, Problem solving, Attention, Memory, Safety/judgement, Awareness                   Current Attention Level: Sustained (highly distractible) Memory: Decreased recall of precautions Following Commands: Follows one step commands consistently, Follows one step commands with increased time Safety/Judgement: Decreased awareness of safety Awareness: Intellectual, Emergent Problem Solving: Slow processing, Requires  verbal cues General Comments: constant cues for sequencing of bed mobility and to sustain attention to tasks. Highly internally and externally distractible        Exercises      Shoulder  Instructions       General Comments on RA with VSS.    Pertinent Vitals/ Pain       Pain Assessment Pain Assessment: Faces Faces Pain Scale: Hurts little more Pain Location: chest when moving, [name] Pain Descriptors / Indicators: Guarding, Grimacing, Moaning Pain Intervention(s): Limited activity within patient's tolerance, Monitored during session  Home Living                                          Prior Functioning/Environment              Frequency  Min 1X/week        Progress Toward Goals  OT Goals(current goals can now be found in the care plan section)  Progress towards OT goals: Progressing toward goals  Acute Rehab OT Goals Patient Stated Goal: get better OT Goal Formulation: With patient Time For Goal Achievement: 12/23/22 Potential to Achieve Goals: Good ADL Goals Pt Will Perform Grooming: with contact guard assist;standing Pt Will Perform Upper Body Bathing: with set-up;sitting Pt Will Perform Lower Body Bathing: with contact guard assist;sit to/from stand Pt Will Perform Lower Body Dressing: with set-up;sit to/from stand Pt Will Transfer to Toilet: with contact guard assist;ambulating;regular height toilet  Plan      Co-evaluation                 AM-PAC OT "6 Clicks" Daily Activity     Outcome Measure   Help from another person eating meals?: None Help from another person taking care of personal grooming?: A Little Help from another person toileting, which includes using toliet, bedpan, or urinal?: A Lot Help from another person bathing (including washing, rinsing, drying)?: A Lot Help from another person to put on and taking off regular upper body clothing?: A Lot Help from another person to put on and taking off regular lower body clothing?: Total 6 Click Score: 14    End of Session Equipment Utilized During Treatment: Rollator (4 wheels)  OT Visit Diagnosis: Unsteadiness on feet (R26.81);Muscle weakness  (generalized) (M62.81);Other symptoms and signs involving cognitive function   Activity Tolerance Patient tolerated treatment well   Patient Left in chair;with call bell/phone within reach;with family/visitor present   Nurse Communication Mobility status        Time: 1540-1610 OT Time Calculation (min): 30 min  Charges: OT General Charges $OT Visit: 1 Visit OT Treatments $Self Care/Home Management : 8-22 mins $Therapeutic Activity: 8-22 mins  Myrla Halsted, OTD, OTR/L [facility] Acute Rehabilitation Office: [phone]   Myrla Halsted 12/17/2022, 4:36 PM

## 2022-12-17 NOTE — Plan of Care (Signed)
  Problem: Coping: Goal: Ability to adjust to condition or change in health will improve Outcome: Progressing   Problem: Fluid Volume: Goal: Ability to maintain a balanced intake and output will improve Outcome: Progressing   Problem: Metabolic: Goal: Ability to maintain appropriate glucose levels will improve Outcome: Progressing   

## 2022-12-17 NOTE — Plan of Care (Signed)
          Wound Plan  Wounds present:  12/05/22 (MARSI) Medical Adhesive-Related Skin Injury Chest Medial;Anterior Burn marks  12/05/22 Laceration Head Left Left forehead 12/05/22 Incision/Other (Comment) Leg Left Wound post IO placement for IV inserted to the left knee which infiltrated with vasopressors (see EPIC for pics)  Interventions:   12/17/22: twice daily dressing changes with Silvadene to left lower extremity wound, cover this with ABD pads, Kerlix.  Plastics to closely monitor for softening of the eschar over the proximal wound and switch to Vaseline, Xeroform, ABD, Kerlix after 2 to 3 days of Silvadene.   HH/CMM diet; eating 75% of meals Dietician consult   Braden Score: 19 Sensory: 4 Moisture: 4 Activity: 2 Mobility: 3 Nutrition: 3 Friction: 3  Contributors: [employer] engineer, RN, [employer], [employer], CNS  Smith Mince RN BSN CDE, Diabetes Coordinator Chana Bode, RN-BC, CRRN

## 2022-12-17 NOTE — Progress Notes (Signed)
Speech Language Pathology Treatment: Cognitive-Linquistic  Patient Details Name: [name] MRN: [id number] DOB: October 07, 1965 Today's Date: 12/17/2022 Time: 3086-5784 SLP Time Calculation (min) (ACUTE ONLY): 13 min  Assessment / Plan / Recommendation Clinical Impression  Pt seen for cognitive intervention focusing on memory, awareness, attention and problem solving. Pt was better able to sustain her attention to speaker and task this session. She reviewed and answered questions from a mock menu with 100% accuracy including calculations. Yesterday in PT she had difficulty recalling disposition plan but today she was able to state that the plans are for inpatient rehab. She also independently recalled that there has been a consult for her left leg wound and what they say may affect when she goes to rehab. Pt's cognition has improved and she appears functional for the acute setting. ST will sign off for acute but recommend she have evaluation in rehab for higher level cognitive abilities. She has a history of ADD and has "always struggled with attention."   HPI HPI: 57 year old female admitted 10/10 after suffering cardiac arrest + bystander CPR and eventual ROSC after 40 minutes.  Past medical history is significant for hypertension, hyperlipidemia, CHF with EF 30 to 35% on GDMT, DM, and morbid obesity, arthritis, ADD, OSA on CPAP, SS/back pain, DM2.      SLP Plan  All goals met (for acute care)      Recommendations for follow up therapy are one component of a multi-disciplinary discharge planning process, led by the attending physician.  Recommendations may be updated based on patient status, additional functional criteria and insurance authorization.    Recommendations                   Rehab consult Oral care BID   Intermittent Supervision/Assistance Cognitive communication deficit (O96.295)     All goals met (for acute care)     Royce Macadamia  12/17/2022, 10:10 AM

## 2022-12-17 NOTE — TOC Transition Note (Signed)
Transition of Care [facility]) - CM/SW Discharge Note   Patient Details  Name: [name] MRN: [id number] Date of Birth: 23-Jun-1965  [facility] CM/SW Contact:  Leone Haven, RN Phone Number: 12/17/2022, 1:56 PM   Clinical Narrative:    Patient is for dc to CIR today,      Barriers to Discharge: Continued Medical Work up   Patient Goals and CMS Choice   Choice offered to / list presented to : NA  Discharge Placement                         Discharge Plan and Services Additional resources added to the After Visit Summary for   In-house Referral: NA Discharge Planning Services: CM Consult Post Acute Care Choice: NA          DME Arranged: N/A DME Agency: NA       HH Arranged: NA          Social Determinants of Health (SDOH) Interventions SDOH Screenings   Food Insecurity: No Food Insecurity (12/06/2022)  Housing: Patient Unable To Answer (12/11/2022)  Transportation Needs: No Transportation Needs (12/11/2022)  Utilities: Not At Risk (12/06/2022)  Alcohol Screen: Low Risk  (12/11/2022)  Depression (PHQ2-9): High Risk (12/05/2022)  Financial Resource Strain: Low Risk  (12/11/2022)  Physical Activity: Inactive (11/12/2022)  Social Connections: Socially Isolated (08/08/2021)  Stress: Stress Concern Present (11/12/2022)  Tobacco Use: Medium Risk (12/06/2022)     Readmission Risk Interventions     No data to display

## 2022-12-17 NOTE — Progress Notes (Signed)
Progress Note  Patient Name: [name] Date of Encounter: 12/17/2022  Primary Cardiologist:   [name], MD   Subjective   BP 135/73, Cr stable at 1.01 this morning. Reports chest pain withy inspiration.  Continues to have leg pain  Inpatient Medications    Scheduled Meds:  atorvastatin  80 mg Oral Daily   Chlorhexidine Gluconate Cloth  6 each Topical Daily   dapagliflozin propanediol  10 mg Oral Daily   diclofenac Sodium  2 g Topical QID   furosemide  40 mg Oral Daily   heparin injection (subcutaneous)  5,000 Units Subcutaneous Q8H   insulin aspart  0-20 Units Subcutaneous TID WC   losartan  50 mg Oral Daily   metoprolol succinate  50 mg Oral Daily   mouth rinse  15 mL Mouth Rinse 4 times per day   sodium chloride flush  10 mL Intravenous Q12H   sodium chloride flush  3 mL Intravenous Q12H   spironolactone  12.5 mg Oral Daily   Continuous Infusions:   PRN Meds: acetaminophen **OR** acetaminophen (TYLENOL) oral liquid 160 mg/5 mL **OR** acetaminophen, ALPRAZolam, hydrOXYzine, mouth rinse, oxyCODONE, polyethylene glycol, sodium chloride flush   Vital Signs    Vitals:   12/16/22 2328 12/17/22 0027 12/17/22 0426 12/17/22 0438  BP:  125/84 135/73   Pulse:  74 72   Resp: 16 17 16    Temp:  97.6 F (36.4 C) 97.7 F (36.5 C)   TempSrc:  Oral Oral   SpO2:  95% 95%   Weight:    133.5 kg  Height:        Intake/Output Summary (Last 24 hours) at 12/17/2022 1191 Last data filed at 12/17/2022 0030 Gross per 24 hour  Intake 120 ml  Output 1350 ml  Net -1230 ml   Filed Weights   12/15/22 2042 12/16/22 0500 12/17/22 0438  Weight: 133.9 kg 133.9 kg 133.5 kg    Telemetry  NSR with PVCs:  Personally Reviewed  ECG    NA - Personally Reviewed  Physical Exam   GEN:     Anxious no acute distress.   Neck: No  JVD Cardiac: RRR, no murmurs, rubs, or gallops.  Respiratory: Clear   to auscultation bilaterally. GI: Soft, nontender, non-distended, normal  bowel sounds  MS:  No edema; bandage over left leg Neuro:   Nonfocal  Psych: Oriented and appropriate    Labs    Chemistry Recent Labs  Lab 12/13/22 0500 12/16/22 0233 12/17/22 0420  NA 138 138 137  K 4.0 4.6 3.8  CL 104 101 99  CO2 24 23 28   GLUCOSE 125* 115* 119*  BUN 17 18 17   CREATININE 1.06* 1.24* 1.01*  CALCIUM 9.2 9.0 8.8*  GFRNONAA >60 51* >60  ANIONGAP 10 14 10      Hematology Recent Labs  Lab 12/11/22 0342 12/12/22 0305 12/13/22 0500  WBC 6.9 7.0 8.1  RBC 4.54 4.48 4.32  HGB 12.6 12.9 12.4  HCT 38.7 38.7 37.8  MCV 85.2 86.4 87.5  MCH 27.8 28.8 28.7  MCHC 32.6 33.3 32.8  RDW 14.7 14.7 14.9  PLT 185 192 220    Cardiac EnzymesNo results for input(s): "TROPONINI" in the last 168 hours. No results for input(s): "TROPIPOC" in the last 168 hours.   BNPNo results for input(s): "BNP", "PROBNP" in the last 168 hours.   DDimer No results for input(s): "DDIMER" in the last 168 hours.   Radiology    No results found.  Cardiac Studies  Cath above:  NL coronaries.   Right heart pressures OK.    Patient Profile     57 y.o. 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  Echocardiogram 12/07/2022 showed EF 30 to 35%, normal RV function.  Cath 12/09/2022 showed normal coronary arteries, RA 8, RV 47/6, PA 46/21/31, PCWP 19, CI 2.8.  Cardiac MRI 12/12/2022 showed LVEF 31%, RVEF 50%, RV insertion site LGE. -Continue Lasix 40 mg daily -Continue losartan 50 mg daily, Toprol-XL 50 mg daily, spironolactone 12.5 mg daily, Farxiga 10 mg daily.  Previously was on entresto and coreg but reports would not take her evening doses of any medications; regimen simplified to once daily dosing  VF arrest: EP consulted, planning ICD once leg wound heals.  Recommend  LifeVest in the meantime.  She has been fitted for lifevest  Hyperlipidemia: On atorvastatin 80 mg daily  T2DM: On SSI, Farxiga  Leg wound: plastic surgery consulted   For questions or updates, please contact [facility] Please consult [email or web address] for contact info under Cardiology/STEMI.   Signed, Little Ishikawa, MD  12/17/2022, 9:22 AM
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
## 2022-12-17 NOTE — Progress Notes (Addendum)
Inpatient Rehab Admissions Coordinator:   Need to hold admit today 2/2 staffing on CIR.  Will plan to admit tomorrow.   Addendum: We can admit today.  Confirmed with staffing.  Dr. Elvera Lennox in agreement.  Will make arrangements.   Estill Dooms, PT, DPT Admissions Coordinator [phone] 12/17/22  4:45 PM

## 2022-12-17 NOTE — Discharge Summary (Signed)
assess for infiltration and scar tissue. Phase contrast velocity mapping was performed above the aortic and pulmonic valves CONTRAST:  10 cc  of Gadavist FINDINGS: Left ventricle: -No hypertrophy -Normal size -Moderate systolic dysfunction -Elevated ECV (31%) -Normal T2 values -RV insertion site LGE LV EF:  31% (Normal 52-79%) Absolute volumes: LV EDV: (Normal 78-167 mL) LV ESV: (Normal 21-64 mL) LV SV: 72mL (Normal 52-114 mL) CO: 5.9L/min (Normal 2.7-6.3 L/min) Indexed volumes: LV EDV: 74mL/sq-m (Normal 50-96 mL/sq-m) LV ESV: 86mL/sq-m (Normal 10-40 mL/sq-m) LV SV: 29mL/sq-m (Normal 33-64 mL/sq-m) CI: 2.4L/min/sq-m (Normal 1.9-3.9 L/min/sq-m) Right ventricle: Normal size and systolic function RV EF: 50% (Normal 52-80%) Absolute volumes: RV EDV: (Normal 79-175 mL) RV ESV: 70mL (Normal 13-75 mL) RV SV: 69mL (Normal 56-110 mL) CO: 5.6L/min (Normal 2.7-6 L/min) Indexed volumes: RV EDV: 20mL/sq-m (Normal 51-97 mL/sq-m) RV ESV: 69mL/sq-m (Normal 9-42 mL/sq-m) RV SV: 37mL/sq-m (Normal 35-61 mL/sq-m) CI: 2.2L/min/sq-m (Normal 1.8-3.8 L/min/sq-m) Left atrium: Mild enlargement Right atrium: Normal size Mitral valve: Trivial regurgitation Aortic valve: Trivial regurgitation Tricuspid valve: Trivial regurgitation Pulmonic valve: Trivial regurgitation Aorta: Normal proximal ascending aorta Pericardium: Normal IMPRESSION: 1. Normal LV size, no hypertrophy, and moderate systolic dysfunction (EF 31%) 2.  Normal RV size and systolic function (EF 50%) 3. RV insertion site late  gadolinium enhancement, which is a nonspecific scar pattern often seen in setting of elevated pulmonary pressures Electronically Signed   By: Epifanio Lesches M.D.   On: 12/12/2022 22:29   DG CHEST PORT 1 VIEW  Result Date: 12/10/2022 CLINICAL DATA:  Pneumonia EXAM: PORTABLE CHEST 1 VIEW COMPARISON:  12/06/2022 FINDINGS: Patient is rotated. Endotracheal and enteric tubes have been removed. Left subclavian central line remains in place. Cardiac silhouette remains mildly enlarged. Improving aeration of the lung bases with mild residual atelectasis. No pleural effusion or pneumothorax. IMPRESSION: Improving aeration of the lung bases with mild residual atelectasis. Electronically Signed   By: Duanne Guess D.O.   On: 12/10/2022 10:28   CARDIAC CATHETERIZATION  Result Date: 12/09/2022 Images from the original result were not included. Right & Left Heart Catheterization 12/09/22: Hemodynamic data: LV: 119/7, EDP 17 mmHg.  Ao 110/78, mean 92 mmHg.  No pressure gradient across the aortic valve. RA: 15/13, mean 8 mmHg. RV 47/6, EDP 17 mmHg. PA 46/21, mean 31 mmHg.  PA saturation 66%. PW 18/19, mean 19 mmHg.  AO saturation 92%. CO 6.69, CI 2.83, preserved and normal.  QP/QS 1.00. Angiographic data: RCA: Dominant, has anterior origin, small to normal. LM: Has superior takeoff, a 5 [place] 3.0 guide catheter was utilized to engage.  Normal left main. LAD: Large-caliber vessel giving origin to 2 large diagonals, smooth and normal. RI: Moderate caliber vessel, smooth and normal. LCx: Large vessel giving origin to large OM1 which has secondary branches and continues in the AV groove is a moderate-sized vessel and gives origin to OM 2 from the distal end.  Smooth and normal. Impression and recommendations: Findings consistent with nonischemic cardiomyopathy.  Mildly elevated PA pressure with elevated EDP.   ECHOCARDIOGRAM COMPLETE  Result Date: 12/07/2022    ECHOCARDIOGRAM REPORT   Patient Name:   TANICE CONDOR Date of Exam: 12/07/2022 Medical Rec #:  [id number]    Height:       64.0 in Accession #:    [id number]   Weight:       302.9 lb Date of Birth:  [birth year]     BSA:          2.334  assess for infiltration and scar tissue. Phase contrast velocity mapping was performed above the aortic and pulmonic valves CONTRAST:  10 cc  of Gadavist FINDINGS: Left ventricle: -No hypertrophy -Normal size -Moderate systolic dysfunction -Elevated ECV (31%) -Normal T2 values -RV insertion site LGE LV EF:  31% (Normal 52-79%) Absolute volumes: LV EDV: (Normal 78-167 mL) LV ESV: (Normal 21-64 mL) LV SV: 72mL (Normal 52-114 mL) CO: 5.9L/min (Normal 2.7-6.3 L/min) Indexed volumes: LV EDV: 74mL/sq-m (Normal 50-96 mL/sq-m) LV ESV: 86mL/sq-m (Normal 10-40 mL/sq-m) LV SV: 29mL/sq-m (Normal 33-64 mL/sq-m) CI: 2.4L/min/sq-m (Normal 1.9-3.9 L/min/sq-m) Right ventricle: Normal size and systolic function RV EF: 50% (Normal 52-80%) Absolute volumes: RV EDV: (Normal 79-175 mL) RV ESV: 70mL (Normal 13-75 mL) RV SV: 69mL (Normal 56-110 mL) CO: 5.6L/min (Normal 2.7-6 L/min) Indexed volumes: RV EDV: 20mL/sq-m (Normal 51-97 mL/sq-m) RV ESV: 69mL/sq-m (Normal 9-42 mL/sq-m) RV SV: 37mL/sq-m (Normal 35-61 mL/sq-m) CI: 2.2L/min/sq-m (Normal 1.8-3.8 L/min/sq-m) Left atrium: Mild enlargement Right atrium: Normal size Mitral valve: Trivial regurgitation Aortic valve: Trivial regurgitation Tricuspid valve: Trivial regurgitation Pulmonic valve: Trivial regurgitation Aorta: Normal proximal ascending aorta Pericardium: Normal IMPRESSION: 1. Normal LV size, no hypertrophy, and moderate systolic dysfunction (EF 31%) 2.  Normal RV size and systolic function (EF 50%) 3. RV insertion site late  gadolinium enhancement, which is a nonspecific scar pattern often seen in setting of elevated pulmonary pressures Electronically Signed   By: Epifanio Lesches M.D.   On: 12/12/2022 22:29   DG CHEST PORT 1 VIEW  Result Date: 12/10/2022 CLINICAL DATA:  Pneumonia EXAM: PORTABLE CHEST 1 VIEW COMPARISON:  12/06/2022 FINDINGS: Patient is rotated. Endotracheal and enteric tubes have been removed. Left subclavian central line remains in place. Cardiac silhouette remains mildly enlarged. Improving aeration of the lung bases with mild residual atelectasis. No pleural effusion or pneumothorax. IMPRESSION: Improving aeration of the lung bases with mild residual atelectasis. Electronically Signed   By: Duanne Guess D.O.   On: 12/10/2022 10:28   CARDIAC CATHETERIZATION  Result Date: 12/09/2022 Images from the original result were not included. Right & Left Heart Catheterization 12/09/22: Hemodynamic data: LV: 119/7, EDP 17 mmHg.  Ao 110/78, mean 92 mmHg.  No pressure gradient across the aortic valve. RA: 15/13, mean 8 mmHg. RV 47/6, EDP 17 mmHg. PA 46/21, mean 31 mmHg.  PA saturation 66%. PW 18/19, mean 19 mmHg.  AO saturation 92%. CO 6.69, CI 2.83, preserved and normal.  QP/QS 1.00. Angiographic data: RCA: Dominant, has anterior origin, small to normal. LM: Has superior takeoff, a 5 [place] 3.0 guide catheter was utilized to engage.  Normal left main. LAD: Large-caliber vessel giving origin to 2 large diagonals, smooth and normal. RI: Moderate caliber vessel, smooth and normal. LCx: Large vessel giving origin to large OM1 which has secondary branches and continues in the AV groove is a moderate-sized vessel and gives origin to OM 2 from the distal end.  Smooth and normal. Impression and recommendations: Findings consistent with nonischemic cardiomyopathy.  Mildly elevated PA pressure with elevated EDP.   ECHOCARDIOGRAM COMPLETE  Result Date: 12/07/2022    ECHOCARDIOGRAM REPORT   Patient Name:   TANICE CONDOR Date of Exam: 12/07/2022 Medical Rec #:  [id number]    Height:       64.0 in Accession #:    [id number]   Weight:       302.9 lb Date of Birth:  [birth year]     BSA:          2.334  assess for infiltration and scar tissue. Phase contrast velocity mapping was performed above the aortic and pulmonic valves CONTRAST:  10 cc  of Gadavist FINDINGS: Left ventricle: -No hypertrophy -Normal size -Moderate systolic dysfunction -Elevated ECV (31%) -Normal T2 values -RV insertion site LGE LV EF:  31% (Normal 52-79%) Absolute volumes: LV EDV: (Normal 78-167 mL) LV ESV: (Normal 21-64 mL) LV SV: 72mL (Normal 52-114 mL) CO: 5.9L/min (Normal 2.7-6.3 L/min) Indexed volumes: LV EDV: 74mL/sq-m (Normal 50-96 mL/sq-m) LV ESV: 86mL/sq-m (Normal 10-40 mL/sq-m) LV SV: 29mL/sq-m (Normal 33-64 mL/sq-m) CI: 2.4L/min/sq-m (Normal 1.9-3.9 L/min/sq-m) Right ventricle: Normal size and systolic function RV EF: 50% (Normal 52-80%) Absolute volumes: RV EDV: (Normal 79-175 mL) RV ESV: 70mL (Normal 13-75 mL) RV SV: 69mL (Normal 56-110 mL) CO: 5.6L/min (Normal 2.7-6 L/min) Indexed volumes: RV EDV: 20mL/sq-m (Normal 51-97 mL/sq-m) RV ESV: 69mL/sq-m (Normal 9-42 mL/sq-m) RV SV: 37mL/sq-m (Normal 35-61 mL/sq-m) CI: 2.2L/min/sq-m (Normal 1.8-3.8 L/min/sq-m) Left atrium: Mild enlargement Right atrium: Normal size Mitral valve: Trivial regurgitation Aortic valve: Trivial regurgitation Tricuspid valve: Trivial regurgitation Pulmonic valve: Trivial regurgitation Aorta: Normal proximal ascending aorta Pericardium: Normal IMPRESSION: 1. Normal LV size, no hypertrophy, and moderate systolic dysfunction (EF 31%) 2.  Normal RV size and systolic function (EF 50%) 3. RV insertion site late  gadolinium enhancement, which is a nonspecific scar pattern often seen in setting of elevated pulmonary pressures Electronically Signed   By: Epifanio Lesches M.D.   On: 12/12/2022 22:29   DG CHEST PORT 1 VIEW  Result Date: 12/10/2022 CLINICAL DATA:  Pneumonia EXAM: PORTABLE CHEST 1 VIEW COMPARISON:  12/06/2022 FINDINGS: Patient is rotated. Endotracheal and enteric tubes have been removed. Left subclavian central line remains in place. Cardiac silhouette remains mildly enlarged. Improving aeration of the lung bases with mild residual atelectasis. No pleural effusion or pneumothorax. IMPRESSION: Improving aeration of the lung bases with mild residual atelectasis. Electronically Signed   By: Duanne Guess D.O.   On: 12/10/2022 10:28   CARDIAC CATHETERIZATION  Result Date: 12/09/2022 Images from the original result were not included. Right & Left Heart Catheterization 12/09/22: Hemodynamic data: LV: 119/7, EDP 17 mmHg.  Ao 110/78, mean 92 mmHg.  No pressure gradient across the aortic valve. RA: 15/13, mean 8 mmHg. RV 47/6, EDP 17 mmHg. PA 46/21, mean 31 mmHg.  PA saturation 66%. PW 18/19, mean 19 mmHg.  AO saturation 92%. CO 6.69, CI 2.83, preserved and normal.  QP/QS 1.00. Angiographic data: RCA: Dominant, has anterior origin, small to normal. LM: Has superior takeoff, a 5 [place] 3.0 guide catheter was utilized to engage.  Normal left main. LAD: Large-caliber vessel giving origin to 2 large diagonals, smooth and normal. RI: Moderate caliber vessel, smooth and normal. LCx: Large vessel giving origin to large OM1 which has secondary branches and continues in the AV groove is a moderate-sized vessel and gives origin to OM 2 from the distal end.  Smooth and normal. Impression and recommendations: Findings consistent with nonischemic cardiomyopathy.  Mildly elevated PA pressure with elevated EDP.   ECHOCARDIOGRAM COMPLETE  Result Date: 12/07/2022    ECHOCARDIOGRAM REPORT   Patient Name:   TANICE CONDOR Date of Exam: 12/07/2022 Medical Rec #:  [id number]    Height:       64.0 in Accession #:    [id number]   Weight:       302.9 lb Date of Birth:  [birth year]     BSA:          2.334  assess for infiltration and scar tissue. Phase contrast velocity mapping was performed above the aortic and pulmonic valves CONTRAST:  10 cc  of Gadavist FINDINGS: Left ventricle: -No hypertrophy -Normal size -Moderate systolic dysfunction -Elevated ECV (31%) -Normal T2 values -RV insertion site LGE LV EF:  31% (Normal 52-79%) Absolute volumes: LV EDV: (Normal 78-167 mL) LV ESV: (Normal 21-64 mL) LV SV: 72mL (Normal 52-114 mL) CO: 5.9L/min (Normal 2.7-6.3 L/min) Indexed volumes: LV EDV: 74mL/sq-m (Normal 50-96 mL/sq-m) LV ESV: 86mL/sq-m (Normal 10-40 mL/sq-m) LV SV: 29mL/sq-m (Normal 33-64 mL/sq-m) CI: 2.4L/min/sq-m (Normal 1.9-3.9 L/min/sq-m) Right ventricle: Normal size and systolic function RV EF: 50% (Normal 52-80%) Absolute volumes: RV EDV: (Normal 79-175 mL) RV ESV: 70mL (Normal 13-75 mL) RV SV: 69mL (Normal 56-110 mL) CO: 5.6L/min (Normal 2.7-6 L/min) Indexed volumes: RV EDV: 20mL/sq-m (Normal 51-97 mL/sq-m) RV ESV: 69mL/sq-m (Normal 9-42 mL/sq-m) RV SV: 37mL/sq-m (Normal 35-61 mL/sq-m) CI: 2.2L/min/sq-m (Normal 1.8-3.8 L/min/sq-m) Left atrium: Mild enlargement Right atrium: Normal size Mitral valve: Trivial regurgitation Aortic valve: Trivial regurgitation Tricuspid valve: Trivial regurgitation Pulmonic valve: Trivial regurgitation Aorta: Normal proximal ascending aorta Pericardium: Normal IMPRESSION: 1. Normal LV size, no hypertrophy, and moderate systolic dysfunction (EF 31%) 2.  Normal RV size and systolic function (EF 50%) 3. RV insertion site late  gadolinium enhancement, which is a nonspecific scar pattern often seen in setting of elevated pulmonary pressures Electronically Signed   By: Epifanio Lesches M.D.   On: 12/12/2022 22:29   DG CHEST PORT 1 VIEW  Result Date: 12/10/2022 CLINICAL DATA:  Pneumonia EXAM: PORTABLE CHEST 1 VIEW COMPARISON:  12/06/2022 FINDINGS: Patient is rotated. Endotracheal and enteric tubes have been removed. Left subclavian central line remains in place. Cardiac silhouette remains mildly enlarged. Improving aeration of the lung bases with mild residual atelectasis. No pleural effusion or pneumothorax. IMPRESSION: Improving aeration of the lung bases with mild residual atelectasis. Electronically Signed   By: Duanne Guess D.O.   On: 12/10/2022 10:28   CARDIAC CATHETERIZATION  Result Date: 12/09/2022 Images from the original result were not included. Right & Left Heart Catheterization 12/09/22: Hemodynamic data: LV: 119/7, EDP 17 mmHg.  Ao 110/78, mean 92 mmHg.  No pressure gradient across the aortic valve. RA: 15/13, mean 8 mmHg. RV 47/6, EDP 17 mmHg. PA 46/21, mean 31 mmHg.  PA saturation 66%. PW 18/19, mean 19 mmHg.  AO saturation 92%. CO 6.69, CI 2.83, preserved and normal.  QP/QS 1.00. Angiographic data: RCA: Dominant, has anterior origin, small to normal. LM: Has superior takeoff, a 5 [place] 3.0 guide catheter was utilized to engage.  Normal left main. LAD: Large-caliber vessel giving origin to 2 large diagonals, smooth and normal. RI: Moderate caliber vessel, smooth and normal. LCx: Large vessel giving origin to large OM1 which has secondary branches and continues in the AV groove is a moderate-sized vessel and gives origin to OM 2 from the distal end.  Smooth and normal. Impression and recommendations: Findings consistent with nonischemic cardiomyopathy.  Mildly elevated PA pressure with elevated EDP.   ECHOCARDIOGRAM COMPLETE  Result Date: 12/07/2022    ECHOCARDIOGRAM REPORT   Patient Name:   TANICE CONDOR Date of Exam: 12/07/2022 Medical Rec #:  [id number]    Height:       64.0 in Accession #:    [id number]   Weight:       302.9 lb Date of Birth:  [birth year]     BSA:          2.334  Physician Discharge Summary  Marlaine Ya TOI:[id number] DOB: [birth year] DOA: 12/05/2022  PCP: [name], MD  Admit date: 12/05/2022 Discharge date: 12/17/2022  Admitted From: home Disposition:  CIR  Recommendations for Outpatient Follow-up:  Follow up with PCP in 1-2 weeks Please obtain BMP/CBC in 2-3 days  Home Health: none Equipment/Devices: none  Discharge Condition: stable CODE STATUS: Full code Diet Orders (From admission, onward)     Start     Ordered   12/13/22 1511  Diet heart healthy/carb modified Room service appropriate? Yes; Fluid consistency: Thin  Diet effective now       Question Answer Comment  Diet-HS Snack? Nothing   Room service appropriate? Yes   Fluid consistency: Thin      12/13/22 1510            HPI: Per admitting MD, Gabriell Deloera is a 57 y.o. female with past medical history of hypertension, postpartum cardiomyopathy, chronic systolic CHF last Ef 30-35%, diabetes mellitus type 2, depression, OSA, and morbid obesity who presented to [facility] on 10/10 after suffering cardiac arrest.  Patient's husband had attempted CPR prior to EMS arrival.  Patient was found to be in VT/VF and ACLS was administered.  It was reported that patient had several arrests totaling up to 40 minutes each noted to be VT/V-fib.  IV access was obtained with use of a left leg IO.  She had been intubated for airway protection and admitted by PCCM on Levophed for shock.  Echocardiogram from 10/12 noted EF to be 30 to 35% with normal RV function.  Patient was noted to have weeping wounds with signs of skin necrosis of the left lower extremity from where the IO had been placed.   Orthopedics had been consulted, but felt there was no signs of compartments syndrome and concerns of skin process involving a large area of the anterior aspect of the lower leg to be followed up with Dr. Lajoyce Corners or plastic surgery.  Subsequently patient had cardiac cath on 10/14 which showed normal  coronaries. cardiac MRI obtained 10/17 noted LVEF of 31% with RVEF 50%.  Patient had been able to be extubated and transition out of the ICU.  TRH called to evaluate for management of left leg wound and to assume care.  Patient notes that she still feels like she has been hit by a [place] truck.  Any movement feels like she is pulling muscles and the pain radiates into her right scapula.  Since the incident she feels like her memory is significantly poor and she has also had severe pain in her right leg.  Normally she is on ibuprofen 800 mg, but here that they have only been giving her 600 mg and it is not helping.   Hospital Course / Discharge diagnoses: Principal Problem:   Cardiac arrest with ventricular fibrillation (HCC) Active Problems:   Chronic combined systolic and diastolic heart failure (HCC)   Hyperglycemia   Cardiac arrest (HCC)   Acute HFrEF (heart failure with reduced ejection fraction) (HCC)   Acute respiratory failure with hypoxia (HCC)   Wound of left leg   Ventricular fibrillation (HCC)  Principal problem S/p V-fib arrest systolic CHF, chronic systolic CHF - Patient had been noted to have V-fib as the cause of cardiac arrest.  Cardiology has been consulted and followed patient while hospitalized.  Echocardiogram showed EF to be 30-35% with normal RV function.  Underwent a cardiac catheterization on 10/12 and she had normal coronary arteries.  She was  Physician Discharge Summary  Marlaine Ya TOI:[id number] DOB: [birth year] DOA: 12/05/2022  PCP: [name], MD  Admit date: 12/05/2022 Discharge date: 12/17/2022  Admitted From: home Disposition:  CIR  Recommendations for Outpatient Follow-up:  Follow up with PCP in 1-2 weeks Please obtain BMP/CBC in 2-3 days  Home Health: none Equipment/Devices: none  Discharge Condition: stable CODE STATUS: Full code Diet Orders (From admission, onward)     Start     Ordered   12/13/22 1511  Diet heart healthy/carb modified Room service appropriate? Yes; Fluid consistency: Thin  Diet effective now       Question Answer Comment  Diet-HS Snack? Nothing   Room service appropriate? Yes   Fluid consistency: Thin      12/13/22 1510            HPI: Per admitting MD, Gabriell Deloera is a 57 y.o. female with past medical history of hypertension, postpartum cardiomyopathy, chronic systolic CHF last Ef 30-35%, diabetes mellitus type 2, depression, OSA, and morbid obesity who presented to [facility] on 10/10 after suffering cardiac arrest.  Patient's husband had attempted CPR prior to EMS arrival.  Patient was found to be in VT/VF and ACLS was administered.  It was reported that patient had several arrests totaling up to 40 minutes each noted to be VT/V-fib.  IV access was obtained with use of a left leg IO.  She had been intubated for airway protection and admitted by PCCM on Levophed for shock.  Echocardiogram from 10/12 noted EF to be 30 to 35% with normal RV function.  Patient was noted to have weeping wounds with signs of skin necrosis of the left lower extremity from where the IO had been placed.   Orthopedics had been consulted, but felt there was no signs of compartments syndrome and concerns of skin process involving a large area of the anterior aspect of the lower leg to be followed up with Dr. Lajoyce Corners or plastic surgery.  Subsequently patient had cardiac cath on 10/14 which showed normal  coronaries. cardiac MRI obtained 10/17 noted LVEF of 31% with RVEF 50%.  Patient had been able to be extubated and transition out of the ICU.  TRH called to evaluate for management of left leg wound and to assume care.  Patient notes that she still feels like she has been hit by a [place] truck.  Any movement feels like she is pulling muscles and the pain radiates into her right scapula.  Since the incident she feels like her memory is significantly poor and she has also had severe pain in her right leg.  Normally she is on ibuprofen 800 mg, but here that they have only been giving her 600 mg and it is not helping.   Hospital Course / Discharge diagnoses: Principal Problem:   Cardiac arrest with ventricular fibrillation (HCC) Active Problems:   Chronic combined systolic and diastolic heart failure (HCC)   Hyperglycemia   Cardiac arrest (HCC)   Acute HFrEF (heart failure with reduced ejection fraction) (HCC)   Acute respiratory failure with hypoxia (HCC)   Wound of left leg   Ventricular fibrillation (HCC)  Principal problem S/p V-fib arrest systolic CHF, chronic systolic CHF - Patient had been noted to have V-fib as the cause of cardiac arrest.  Cardiology has been consulted and followed patient while hospitalized.  Echocardiogram showed EF to be 30-35% with normal RV function.  Underwent a cardiac catheterization on 10/12 and she had normal coronary arteries.  She was  Physician Discharge Summary  Marlaine Ya TOI:[id number] DOB: [birth year] DOA: 12/05/2022  PCP: [name], MD  Admit date: 12/05/2022 Discharge date: 12/17/2022  Admitted From: home Disposition:  CIR  Recommendations for Outpatient Follow-up:  Follow up with PCP in 1-2 weeks Please obtain BMP/CBC in 2-3 days  Home Health: none Equipment/Devices: none  Discharge Condition: stable CODE STATUS: Full code Diet Orders (From admission, onward)     Start     Ordered   12/13/22 1511  Diet heart healthy/carb modified Room service appropriate? Yes; Fluid consistency: Thin  Diet effective now       Question Answer Comment  Diet-HS Snack? Nothing   Room service appropriate? Yes   Fluid consistency: Thin      12/13/22 1510            HPI: Per admitting MD, Gabriell Deloera is a 57 y.o. female with past medical history of hypertension, postpartum cardiomyopathy, chronic systolic CHF last Ef 30-35%, diabetes mellitus type 2, depression, OSA, and morbid obesity who presented to [facility] on 10/10 after suffering cardiac arrest.  Patient's husband had attempted CPR prior to EMS arrival.  Patient was found to be in VT/VF and ACLS was administered.  It was reported that patient had several arrests totaling up to 40 minutes each noted to be VT/V-fib.  IV access was obtained with use of a left leg IO.  She had been intubated for airway protection and admitted by PCCM on Levophed for shock.  Echocardiogram from 10/12 noted EF to be 30 to 35% with normal RV function.  Patient was noted to have weeping wounds with signs of skin necrosis of the left lower extremity from where the IO had been placed.   Orthopedics had been consulted, but felt there was no signs of compartments syndrome and concerns of skin process involving a large area of the anterior aspect of the lower leg to be followed up with Dr. Lajoyce Corners or plastic surgery.  Subsequently patient had cardiac cath on 10/14 which showed normal  coronaries. cardiac MRI obtained 10/17 noted LVEF of 31% with RVEF 50%.  Patient had been able to be extubated and transition out of the ICU.  TRH called to evaluate for management of left leg wound and to assume care.  Patient notes that she still feels like she has been hit by a [place] truck.  Any movement feels like she is pulling muscles and the pain radiates into her right scapula.  Since the incident she feels like her memory is significantly poor and she has also had severe pain in her right leg.  Normally she is on ibuprofen 800 mg, but here that they have only been giving her 600 mg and it is not helping.   Hospital Course / Discharge diagnoses: Principal Problem:   Cardiac arrest with ventricular fibrillation (HCC) Active Problems:   Chronic combined systolic and diastolic heart failure (HCC)   Hyperglycemia   Cardiac arrest (HCC)   Acute HFrEF (heart failure with reduced ejection fraction) (HCC)   Acute respiratory failure with hypoxia (HCC)   Wound of left leg   Ventricular fibrillation (HCC)  Principal problem S/p V-fib arrest systolic CHF, chronic systolic CHF - Patient had been noted to have V-fib as the cause of cardiac arrest.  Cardiology has been consulted and followed patient while hospitalized.  Echocardiogram showed EF to be 30-35% with normal RV function.  Underwent a cardiac catheterization on 10/12 and she had normal coronary arteries.  She was  Physician Discharge Summary  Marlaine Ya TOI:[id number] DOB: [birth year] DOA: 12/05/2022  PCP: [name], MD  Admit date: 12/05/2022 Discharge date: 12/17/2022  Admitted From: home Disposition:  CIR  Recommendations for Outpatient Follow-up:  Follow up with PCP in 1-2 weeks Please obtain BMP/CBC in 2-3 days  Home Health: none Equipment/Devices: none  Discharge Condition: stable CODE STATUS: Full code Diet Orders (From admission, onward)     Start     Ordered   12/13/22 1511  Diet heart healthy/carb modified Room service appropriate? Yes; Fluid consistency: Thin  Diet effective now       Question Answer Comment  Diet-HS Snack? Nothing   Room service appropriate? Yes   Fluid consistency: Thin      12/13/22 1510            HPI: Per admitting MD, Gabriell Deloera is a 57 y.o. female with past medical history of hypertension, postpartum cardiomyopathy, chronic systolic CHF last Ef 30-35%, diabetes mellitus type 2, depression, OSA, and morbid obesity who presented to [facility] on 10/10 after suffering cardiac arrest.  Patient's husband had attempted CPR prior to EMS arrival.  Patient was found to be in VT/VF and ACLS was administered.  It was reported that patient had several arrests totaling up to 40 minutes each noted to be VT/V-fib.  IV access was obtained with use of a left leg IO.  She had been intubated for airway protection and admitted by PCCM on Levophed for shock.  Echocardiogram from 10/12 noted EF to be 30 to 35% with normal RV function.  Patient was noted to have weeping wounds with signs of skin necrosis of the left lower extremity from where the IO had been placed.   Orthopedics had been consulted, but felt there was no signs of compartments syndrome and concerns of skin process involving a large area of the anterior aspect of the lower leg to be followed up with Dr. Lajoyce Corners or plastic surgery.  Subsequently patient had cardiac cath on 10/14 which showed normal  coronaries. cardiac MRI obtained 10/17 noted LVEF of 31% with RVEF 50%.  Patient had been able to be extubated and transition out of the ICU.  TRH called to evaluate for management of left leg wound and to assume care.  Patient notes that she still feels like she has been hit by a [place] truck.  Any movement feels like she is pulling muscles and the pain radiates into her right scapula.  Since the incident she feels like her memory is significantly poor and she has also had severe pain in her right leg.  Normally she is on ibuprofen 800 mg, but here that they have only been giving her 600 mg and it is not helping.   Hospital Course / Discharge diagnoses: Principal Problem:   Cardiac arrest with ventricular fibrillation (HCC) Active Problems:   Chronic combined systolic and diastolic heart failure (HCC)   Hyperglycemia   Cardiac arrest (HCC)   Acute HFrEF (heart failure with reduced ejection fraction) (HCC)   Acute respiratory failure with hypoxia (HCC)   Wound of left leg   Ventricular fibrillation (HCC)  Principal problem S/p V-fib arrest systolic CHF, chronic systolic CHF - Patient had been noted to have V-fib as the cause of cardiac arrest.  Cardiology has been consulted and followed patient while hospitalized.  Echocardiogram showed EF to be 30-35% with normal RV function.  Underwent a cardiac catheterization on 10/12 and she had normal coronary arteries.  She was  assess for infiltration and scar tissue. Phase contrast velocity mapping was performed above the aortic and pulmonic valves CONTRAST:  10 cc  of Gadavist FINDINGS: Left ventricle: -No hypertrophy -Normal size -Moderate systolic dysfunction -Elevated ECV (31%) -Normal T2 values -RV insertion site LGE LV EF:  31% (Normal 52-79%) Absolute volumes: LV EDV: (Normal 78-167 mL) LV ESV: (Normal 21-64 mL) LV SV: 72mL (Normal 52-114 mL) CO: 5.9L/min (Normal 2.7-6.3 L/min) Indexed volumes: LV EDV: 74mL/sq-m (Normal 50-96 mL/sq-m) LV ESV: 86mL/sq-m (Normal 10-40 mL/sq-m) LV SV: 29mL/sq-m (Normal 33-64 mL/sq-m) CI: 2.4L/min/sq-m (Normal 1.9-3.9 L/min/sq-m) Right ventricle: Normal size and systolic function RV EF: 50% (Normal 52-80%) Absolute volumes: RV EDV: (Normal 79-175 mL) RV ESV: 70mL (Normal 13-75 mL) RV SV: 69mL (Normal 56-110 mL) CO: 5.6L/min (Normal 2.7-6 L/min) Indexed volumes: RV EDV: 20mL/sq-m (Normal 51-97 mL/sq-m) RV ESV: 69mL/sq-m (Normal 9-42 mL/sq-m) RV SV: 37mL/sq-m (Normal 35-61 mL/sq-m) CI: 2.2L/min/sq-m (Normal 1.8-3.8 L/min/sq-m) Left atrium: Mild enlargement Right atrium: Normal size Mitral valve: Trivial regurgitation Aortic valve: Trivial regurgitation Tricuspid valve: Trivial regurgitation Pulmonic valve: Trivial regurgitation Aorta: Normal proximal ascending aorta Pericardium: Normal IMPRESSION: 1. Normal LV size, no hypertrophy, and moderate systolic dysfunction (EF 31%) 2.  Normal RV size and systolic function (EF 50%) 3. RV insertion site late  gadolinium enhancement, which is a nonspecific scar pattern often seen in setting of elevated pulmonary pressures Electronically Signed   By: Epifanio Lesches M.D.   On: 12/12/2022 22:29   DG CHEST PORT 1 VIEW  Result Date: 12/10/2022 CLINICAL DATA:  Pneumonia EXAM: PORTABLE CHEST 1 VIEW COMPARISON:  12/06/2022 FINDINGS: Patient is rotated. Endotracheal and enteric tubes have been removed. Left subclavian central line remains in place. Cardiac silhouette remains mildly enlarged. Improving aeration of the lung bases with mild residual atelectasis. No pleural effusion or pneumothorax. IMPRESSION: Improving aeration of the lung bases with mild residual atelectasis. Electronically Signed   By: Duanne Guess D.O.   On: 12/10/2022 10:28   CARDIAC CATHETERIZATION  Result Date: 12/09/2022 Images from the original result were not included. Right & Left Heart Catheterization 12/09/22: Hemodynamic data: LV: 119/7, EDP 17 mmHg.  Ao 110/78, mean 92 mmHg.  No pressure gradient across the aortic valve. RA: 15/13, mean 8 mmHg. RV 47/6, EDP 17 mmHg. PA 46/21, mean 31 mmHg.  PA saturation 66%. PW 18/19, mean 19 mmHg.  AO saturation 92%. CO 6.69, CI 2.83, preserved and normal.  QP/QS 1.00. Angiographic data: RCA: Dominant, has anterior origin, small to normal. LM: Has superior takeoff, a 5 [place] 3.0 guide catheter was utilized to engage.  Normal left main. LAD: Large-caliber vessel giving origin to 2 large diagonals, smooth and normal. RI: Moderate caliber vessel, smooth and normal. LCx: Large vessel giving origin to large OM1 which has secondary branches and continues in the AV groove is a moderate-sized vessel and gives origin to OM 2 from the distal end.  Smooth and normal. Impression and recommendations: Findings consistent with nonischemic cardiomyopathy.  Mildly elevated PA pressure with elevated EDP.   ECHOCARDIOGRAM COMPLETE  Result Date: 12/07/2022    ECHOCARDIOGRAM REPORT   Patient Name:   TANICE CONDOR Date of Exam: 12/07/2022 Medical Rec #:  [id number]    Height:       64.0 in Accession #:    [id number]   Weight:       302.9 lb Date of Birth:  [birth year]     BSA:          2.334  Physician Discharge Summary  Marlaine Ya TOI:[id number] DOB: [birth year] DOA: 12/05/2022  PCP: [name], MD  Admit date: 12/05/2022 Discharge date: 12/17/2022  Admitted From: home Disposition:  CIR  Recommendations for Outpatient Follow-up:  Follow up with PCP in 1-2 weeks Please obtain BMP/CBC in 2-3 days  Home Health: none Equipment/Devices: none  Discharge Condition: stable CODE STATUS: Full code Diet Orders (From admission, onward)     Start     Ordered   12/13/22 1511  Diet heart healthy/carb modified Room service appropriate? Yes; Fluid consistency: Thin  Diet effective now       Question Answer Comment  Diet-HS Snack? Nothing   Room service appropriate? Yes   Fluid consistency: Thin      12/13/22 1510            HPI: Per admitting MD, Gabriell Deloera is a 57 y.o. female with past medical history of hypertension, postpartum cardiomyopathy, chronic systolic CHF last Ef 30-35%, diabetes mellitus type 2, depression, OSA, and morbid obesity who presented to [facility] on 10/10 after suffering cardiac arrest.  Patient's husband had attempted CPR prior to EMS arrival.  Patient was found to be in VT/VF and ACLS was administered.  It was reported that patient had several arrests totaling up to 40 minutes each noted to be VT/V-fib.  IV access was obtained with use of a left leg IO.  She had been intubated for airway protection and admitted by PCCM on Levophed for shock.  Echocardiogram from 10/12 noted EF to be 30 to 35% with normal RV function.  Patient was noted to have weeping wounds with signs of skin necrosis of the left lower extremity from where the IO had been placed.   Orthopedics had been consulted, but felt there was no signs of compartments syndrome and concerns of skin process involving a large area of the anterior aspect of the lower leg to be followed up with Dr. Lajoyce Corners or plastic surgery.  Subsequently patient had cardiac cath on 10/14 which showed normal  coronaries. cardiac MRI obtained 10/17 noted LVEF of 31% with RVEF 50%.  Patient had been able to be extubated and transition out of the ICU.  TRH called to evaluate for management of left leg wound and to assume care.  Patient notes that she still feels like she has been hit by a [place] truck.  Any movement feels like she is pulling muscles and the pain radiates into her right scapula.  Since the incident she feels like her memory is significantly poor and she has also had severe pain in her right leg.  Normally she is on ibuprofen 800 mg, but here that they have only been giving her 600 mg and it is not helping.   Hospital Course / Discharge diagnoses: Principal Problem:   Cardiac arrest with ventricular fibrillation (HCC) Active Problems:   Chronic combined systolic and diastolic heart failure (HCC)   Hyperglycemia   Cardiac arrest (HCC)   Acute HFrEF (heart failure with reduced ejection fraction) (HCC)   Acute respiratory failure with hypoxia (HCC)   Wound of left leg   Ventricular fibrillation (HCC)  Principal problem S/p V-fib arrest systolic CHF, chronic systolic CHF - Patient had been noted to have V-fib as the cause of cardiac arrest.  Cardiology has been consulted and followed patient while hospitalized.  Echocardiogram showed EF to be 30-35% with normal RV function.  Underwent a cardiac catheterization on 10/12 and she had normal coronary arteries.  She was

## 2022-12-18 ENCOUNTER — Encounter (HOSPITAL_COMMUNITY): Payer: Self-pay | Admitting: Physical Medicine & Rehabilitation

## 2022-12-18 ENCOUNTER — Ambulatory Visit: Payer: Medicare Other

## 2022-12-18 ENCOUNTER — Encounter (HOSPITAL_COMMUNITY): Payer: Medicare Other

## 2022-12-18 ENCOUNTER — Ambulatory Visit: Payer: Medicare Other | Admitting: Physician Assistant

## 2022-12-18 DIAGNOSIS — R5381 Other malaise: Secondary | ICD-10-CM | POA: Diagnosis not present

## 2022-12-18 DIAGNOSIS — E785 Hyperlipidemia, unspecified: Secondary | ICD-10-CM

## 2022-12-18 DIAGNOSIS — S81802D Unspecified open wound, left lower leg, subsequent encounter: Secondary | ICD-10-CM

## 2022-12-18 DIAGNOSIS — E119 Type 2 diabetes mellitus without complications: Secondary | ICD-10-CM

## 2022-12-18 DIAGNOSIS — F988 Other specified behavioral and emotional disorders with onset usually occurring in childhood and adolescence: Secondary | ICD-10-CM | POA: Diagnosis not present

## 2022-12-18 DIAGNOSIS — N1831 Chronic kidney disease, stage 3a: Secondary | ICD-10-CM

## 2022-12-18 DIAGNOSIS — Z794 Long term (current) use of insulin: Secondary | ICD-10-CM

## 2022-12-18 DIAGNOSIS — I428 Other cardiomyopathies: Secondary | ICD-10-CM

## 2022-12-18 LAB — COMPREHENSIVE METABOLIC PANEL
ALT: 26 U/L (ref 0–44)
AST: 23 U/L (ref 15–41)
Albumin: 2.8 g/dL — ABNORMAL LOW (ref 3.5–5.0)
Alkaline Phosphatase: 42 U/L (ref 38–126)
Anion gap: 7 (ref 5–15)
BUN: 18 mg/dL (ref 6–20)
CO2: 30 mmol/L (ref 22–32)
Calcium: 8.8 mg/dL — ABNORMAL LOW (ref 8.9–10.3)
Chloride: 99 mmol/L (ref 98–111)
Creatinine, Ser: 1.15 mg/dL — ABNORMAL HIGH (ref 0.44–1.00)
GFR, Estimated: 56 mL/min — ABNORMAL LOW (ref >60)
Glucose, Bld: 125 mg/dL — ABNORMAL HIGH (ref 70–99)
Potassium: 3.5 mmol/L (ref 3.5–5.1)
Sodium: 136 mmol/L (ref 135–145)
Total Bilirubin: 0.9 mg/dL (ref 0.3–1.2)
Total Protein: 6.1 g/dL — ABNORMAL LOW (ref 6.5–8.1)

## 2022-12-18 LAB — CBC WITH DIFFERENTIAL/PLATELET
Abs Immature Granulocytes: 0.05 10*3/uL (ref 0.00–0.07)
Basophils Absolute: 0.1 10*3/uL (ref 0.0–0.1)
Basophils Relative: 1 %
Eosinophils Absolute: 0.2 10*3/uL (ref 0.0–0.5)
Eosinophils Relative: 2 %
HCT: 40 % (ref 36.0–46.0)
Hemoglobin: 12.9 g/dL (ref 12.0–15.0)
Immature Granulocytes: 1 %
Lymphocytes Relative: 22 %
Lymphs Abs: 2.2 10*3/uL (ref 0.7–4.0)
MCH: 28.2 pg (ref 26.0–34.0)
MCHC: 32.3 g/dL (ref 30.0–36.0)
MCV: 87.3 fL (ref 80.0–100.0)
Monocytes Absolute: 0.8 10*3/uL (ref 0.1–1.0)
Monocytes Relative: 8 %
Neutro Abs: 6.8 10*3/uL (ref 1.7–7.7)
Neutrophils Relative %: 66 %
Platelets: 283 10*3/uL (ref 150–400)
RBC: 4.58 MIL/uL (ref 3.87–5.11)
RDW: 14.8 % (ref 11.5–15.5)
WBC: 10 10*3/uL (ref 4.0–10.5)
nRBC: 0 % (ref 0.0–0.2)

## 2022-12-18 LAB — GLUCOSE, CAPILLARY
Glucose-Capillary: 121 mg/dL — ABNORMAL HIGH (ref 70–99)
Glucose-Capillary: 119 mg/dL — ABNORMAL HIGH (ref 70–99)
Glucose-Capillary: 133 mg/dL — ABNORMAL HIGH (ref 70–99)
Glucose-Capillary: 120 mg/dL — ABNORMAL HIGH (ref 70–99)

## 2022-12-18 NOTE — Plan of Care (Signed)
  Problem: Consults Goal: RH STROKE PATIENT EDUCATION Description: See Patient Education module for education specifics  Outcome: Progressing   Problem: RH SKIN INTEGRITY Goal: RH STG SKIN FREE OF INFECTION/BREAKDOWN Description: Manage w min assist Outcome: Progressing Goal: RH STG MAINTAIN SKIN INTEGRITY WITH ASSISTANCE Description: STG Maintain Skin Integrity With min Assistance. Outcome: Progressing Goal: RH STG ABLE TO PERFORM INCISION/WOUND CARE W/ASSISTANCE Description: STG Able To Perform Incision/Wound Care With min Assistance. Outcome: Progressing   Problem: RH SAFETY Goal: RH STG ADHERE TO SAFETY PRECAUTIONS W/ASSISTANCE/DEVICE Description: STG Adhere to Safety Precautions With cues Assistance/Device. Outcome: Progressing   Problem: RH PAIN MANAGEMENT Goal: RH STG PAIN MANAGED AT OR BELOW PT'S PAIN GOAL Description: < 4 with prns Outcome: Progressing   Problem: RH KNOWLEDGE DEFICIT Goal: RH STG INCREASE KNOWLEDGE OF DIABETES Description: Patient and spouse will be able to manage DM with medications and dietary modification using educational resources independently Outcome: Progressing Goal: RH STG INCREASE KNOWLEDGE OF HYPERTENSION Description: Patient and spouse will be able to manage HTN with medications and dietary modification using educational resources independently Outcome: Progressing Goal: RH STG INCREASE KNOWLEGDE OF HYPERLIPIDEMIA Description: Patient and spouse will be able to manage HLD with medications and dietary modification using educational resources independently Outcome: Progressing   Problem: Education: Goal: Ability to demonstrate management of disease process will improve Description: Patient and spouse will be able to manage HF with medications and dietary modification using educational resources independently Outcome: Progressing Goal: Individualized Educational Video(s) Outcome: Progressing   Problem: Education: Goal: Ability to  describe self-care measures that may prevent or decrease complications (Diabetes Survival Skills Education) will improve Outcome: Progressing Goal: Individualized Educational Video(s) Outcome: Progressing   Problem: Coping: Goal: Ability to adjust to condition or change in health will improve Outcome: Progressing   Problem: Fluid Volume: Goal: Ability to maintain a balanced intake and output will improve Outcome: Progressing   Problem: Health Behavior/Discharge Planning: Goal: Ability to identify and utilize available resources and services will improve Outcome: Progressing Goal: Ability to manage health-related needs will improve Outcome: Progressing   Problem: Metabolic: Goal: Ability to maintain appropriate glucose levels will improve Outcome: Progressing   Problem: Nutritional: Goal: Maintenance of adequate nutrition will improve Outcome: Progressing Goal: Progress toward achieving an optimal weight will improve Outcome: Progressing   Problem: Skin Integrity: Goal: Risk for impaired skin integrity will decrease Outcome: Progressing   Problem: Tissue Perfusion: Goal: Adequacy of tissue perfusion will improve Outcome: Progressing

## 2022-12-18 NOTE — H&P (Signed)
Physical Medicine and Rehabilitation Admission H&P    Chief Complaint  Patient presents with   Functional deficits     HPI: [name] is a 57 year old female with history of HTN, NICM w/chronic HFrEF, morbid obesity-_BMI 50, OSA, T2DM, chronic LBP and right knee pain, depression/anxiety who was admitted on 12/05/22 after witnessed VF cardiac arrest with bystander CPR, prolonged ACLS protocol X 40 minutes, started on IV amiodarone, was unresponsive and intubated on arrival to ED.   Hospital course significant for lactic acidosis with hypotension requiring pressors and elevated trops felt to be due to demand ischemia. She tolerated extubation  without difficulty 10/12 and repeat Echo showed EF 30-35% with trivial MR. CT head negative. Elevated WBC felt to be reactive as BC, CXR, GI panel and UA negative. Acute respiratory failure felt to be due to aspiration PNA and was treated with IV ceftriaxone. She received intraosseous IVF management and was found to have developed skin necrosis left leg with swelling and concerns of compartment syndrome. Dr. Charlann Boxer was consulted and did not feel like she had indications of compartment syndrome.  WOC consulted multiple blisters unroofed with orders for local care with Xeroform gauze and daily dressing changes.   Dr, Girard Cooter consulted and felt that cardiac arrest with unclear cause in setting of long standing CM and ICD recommended.  Cardiac MRI and cardiac cath negative for CAD and recommendations for ICD once leg wound resolves.  She has had left upper chest pain felt to be MS in nature and warm compresses added. Do be started on ritalin for ADHD (new medication) Patient reports memory deficits particular in the days after her cardiac arrest.    Review of Systems  Constitutional:  Negative for chills and weight loss.  HENT:  Negative for congestion and hearing loss.   Eyes:  Negative for blurred vision and double vision.  Respiratory:  Negative for  cough.   Cardiovascular:  Positive for chest pain.  Gastrointestinal:  Positive for constipation. Negative for abdominal pain.  Genitourinary:  Positive for dysuria and urgency.  Musculoskeletal:  Positive for back pain (chronic back) and joint pain.  Neurological:  Positive for dizziness, tingling and weakness. Negative for sensory change.  Psychiatric/Behavioral:  Positive for memory loss (can't remember anything now). The patient is nervous/anxious and has insomnia (Sleeps most of the morning).      Past Medical History:  Diagnosis Date   ADD (attention deficit disorder)    Anxiety    Arthritis    both knees right worse than left   Carpal tunnel syndrome of right wrist    Depression    Essential hypertension, benign    Gallstones    History of smoking 06/22/2016   Hyperlipidemia associated with type 2 diabetes mellitus (HCC), on Zocor 04/04/2011   Hypertension associated with diabetes (HCC) 06/03/2008   Migraines    Morbid obesity (HCC)    Morbid obesity with BMI of 50.0-59.9, adult (HCC) 07/12/2006   NICM (nonischemic cardiomyopathy) (HCC) 07/12/2006   01/16/18 ECHO:    - Procedure narrative: Transthoracic echocardiography. Image   quality was suboptimal. The study was technically difficult.   Intravenous contrast (Definity) was administered. - Left ventricle: The cavity size was moderately dilated. Wall   thickness was increased in a pattern of mild LVH. Systolic   function was moderately to severely reduced. The estimated   ejection fraction w   Normal coronary arteries 04/17/2016   OSA on CPAP 10/28/2014   Spinal stenosis  back pain   Spondylosis without myelopathy or radiculopathy, lumbar region 12/04/2017   Type 2 diabetes mellitus with hyperglycemia [facility])     Past Surgical History:  Procedure Laterality Date   CHOLECYSTECTOMY     DILATATION & CURETTAGE/HYSTEROSCOPY WITH MYOSURE N/A 10/31/2017   Procedure: DILATATION & CURETTAGE/HYSTEROSCOPY WITH MYOSURE;  Surgeon: Romualdo Bolk, MD;  Location: [facility] ORS;  Service: Gynecology;  Laterality: N/A;   HYSTEROSCOPY WITH D & C N/A 05/07/2022   Procedure: DILATATION AND CURETTAGE /HYSTEROSCOPY;  Surgeon: [name], MD;  Location: [facility] OR;  Service: Gynecology;  Laterality: N/A;   RIGHT/LEFT HEART CATH AND CORONARY ANGIOGRAPHY N/A 04/11/2016   Procedure: Right/Left Heart Cath and Coronary Angiography;  Surgeon: Kathleene Hazel, MD;  Location: [facility] INVASIVE CV LAB;  Service: Cardiovascular;  Laterality: N/A;   RIGHT/LEFT HEART CATH AND CORONARY ANGIOGRAPHY N/A 12/09/2022   Procedure: RIGHT/LEFT HEART CATH AND CORONARY ANGIOGRAPHY;  Surgeon: Yates Decamp, MD;  Location: [facility] INVASIVE CV LAB;  Service: Cardiovascular;  Laterality: N/A;   sonogram for blood clots     no blockages    Family History  Problem Relation Age of Onset   Depression Mother    Anxiety disorder Mother    Diabetes Mother    Hypertension Mother    ADD / ADHD Father    Diabetes Father    Hypertension Father    Colitis Father    Hypertension Other    Diabetes Other    Colitis Other    Alcohol abuse Other     Social History:  reports that she quit smoking about 25 years ago. Her smoking use included cigarettes. She has never used smokeless tobacco. She reports that she does not drink alcohol and does not use drugs.    Allergies  Allergen Reactions   Fluoxetine Other (See Comments)    More depressed   Cymbalta [Duloxetine Hcl] Other (See Comments)    depressed   Pork-Derived Products    Penicillins Rash    Has patient had a PCN reaction causing immediate rash, facial/tongue/throat swelling, SOB or lightheadedness with hypotension: YES Has patient had a PCN reaction causing severe rash involving mucus membranes or skin necrosis: NO Has patient had a PCN reaction that required hospitalization: YES Has patient had a PCN reaction occurring within the last 10 years: NO If all of the above answers are "NO", then may proceed with  Cephalosporin use.     Medications Prior to Admission  Medication Sig Dispense Refill   ALPRAZolam (XANAX) 0.5 MG tablet Take 1 tablet (0.5 mg total) by mouth 2 (two) times daily as needed for anxiety (if anxiety unrelieved by hydroxyzine).     atorvastatin (LIPITOR) 80 MG tablet Take 1 tablet (80 mg total) by mouth daily.     Blood Glucose Monitoring Suppl DEVI 1 each by Does not apply route in the morning, at noon, and at bedtime. May substitute to any manufacturer covered by patient's insurance. 1 each 0   cariprazine (VRAYLAR) 3 MG capsule Take 1 capsule (3 mg total) by mouth daily. (Patient taking differently: Take 1.5 mg by mouth daily.) 30 capsule 1   dapagliflozin propanediol (FARXIGA) 10 MG TABS tablet Take 1 tablet (10 mg total) by mouth daily.     escitalopram (LEXAPRO) 10 MG tablet Take 1 tablet (10 mg total) by mouth daily. 90 tablet 1   furosemide (LASIX) 40 MG tablet Take 1 tablet (40 mg total) by mouth daily.     gabapentin (NEURONTIN) 100 MG  capsule Take 1 capsule (100 mg total) by mouth 3 (three) times daily. 90 capsule 1   Glucose Blood (BLOOD GLUCOSE TEST STRIPS) STRP 1 each by In Vitro route in the morning, at noon, and at bedtime. May substitute to any manufacturer covered by patient's insurance. 100 strip 11   losartan (COZAAR) 50 MG tablet Take 1 tablet (50 mg total) by mouth daily.     metoprolol succinate (TOPROL-XL) 50 MG 24 hr tablet Take 1 tablet (50 mg total) by mouth daily. Take with or immediately following a meal.     Misc. Devices (FOAM CUSHION THERAPEUTIC RING) MISC Use while sitting 1 each 0   NON FORMULARY Pt uses cpap nightly     oxyCODONE (OXY IR/ROXICODONE) 5 MG immediate release tablet Take 1 tablet (5 mg total) by mouth every 4 (four) hours as needed for moderate pain (pain score 4-6).     spironolactone (ALDACTONE) 25 MG tablet Take 0.5 tablets (12.5 mg total) by mouth daily.     tirzepatide [facility]) 2.5 MG/0.5ML Pen INJECT 2.5 MG SUBCUTANEOUSLY  WEEKLY (Patient not taking: Reported on 12/05/2022) 2 mL 0         Home: Home Living Family/patient expects to be discharged to:: Private residence Living Arrangements: Spouse/significant other Available Help at Discharge: Family Type of Home: House Home Access: Stairs to enter Secretary/administrator of Steps: 1 Home Layout: Two level Alternate Level Stairs-Number of Steps: flight Alternate Level Stairs-Rails: Left, Right Bathroom Shower/Tub: Engineer, manufacturing systems: Standard Home Equipment: Rollator (4 wheels), Electric scooter Additional Comments: uses scooter if going to a large store  Lives With: Spouse   Functional History: Prior Function Prior Level of Function : Needs assist Mobility Comments: does pool and land therapy at [employer] ADLs Comments: spouse does all IADL's   Functional Status:  Mobility: Bed Mobility Overal bed mobility: Needs Assistance Bed Mobility: Supine to Sit Supine to sit: Mod assist General bed mobility comments: assist for truncal assist, [name] progression to EOB. Transfers Overall transfer level: Needs assistance Equipment used: Rollator (4 wheels) Transfers: Sit to/from Stand Sit to Stand: Min assist Bed to/from chair/wheelchair/BSC transfer type:: Step pivot Step pivot transfers: Min assist, +2 safety/equipment, From elevated surface General transfer comment: assist for power up, rise, steadying. stand x2, from EOB and toilet. Ambulation/Gait Ambulation/Gait assistance: Contact guard assist Gait Distance (Feet): 90 Feet Assistive device: Rollator (4 wheels) Gait Pattern/deviations: Step-through pattern, Shuffle, Decreased stride length, Wide base of support, Trunk flexed General Gait Details: cues for upright posture and rollator proximity, VSS Gait velocity: decr Gait velocity interpretation: <1.31 ft/sec, indicative of household ambulator   ADL: ADL Overall ADL's : Needs assistance/impaired Grooming: Set up, Sitting,  Oral care, Wash/dry face Grooming Details (indicate cue type and reason): increased time to initiate Upper Body Bathing: Moderate assistance, Sitting Lower Body Bathing: Maximal assistance, Sit to/from stand Upper Body Dressing : Moderate assistance, Sitting Upper Body Dressing Details (indicate cue type and reason): discomfort with UE mobility Lower Body Dressing: Maximal assistance, Sit to/from stand Toilet Transfer: Minimal assistance, +2 for physical assistance, +2 for safety/equipment, Stand-pivot, Rolling walker (2 wheels) Functional mobility during ADLs: Minimal assistance, +2 for physical assistance, +2 for safety/equipment (for SPT)   Cognition: Cognition Overall Cognitive Status: Within Functional Limits for tasks assessed Arousal/Alertness: Awake/alert Orientation Level: Oriented X4 Year: 2024 Day of Week: Incorrect Attention: Sustained Sustained Attention: Impaired Sustained Attention Impairment: Verbal basic (internally distracted) Memory: Impaired Memory Impairment: Decreased recall of new information Awareness: Impaired Awareness Impairment: Intellectual  impairment Problem Solving: Appears intact Safety/Judgment: Appears intact Cognition Arousal: Alert Behavior During Therapy: WFL for tasks assessed/performed Overall Cognitive Status: Within Functional Limits for tasks assessed Area of Impairment: Following commands, Memory, Attention Orientation Level: Disoriented to, Time (alert to month and year) Current Attention Level: Sustained (highly distractible) Memory: Decreased recall of precautions Following Commands: Follows one step commands consistently, Follows one step commands with increased time Safety/Judgement: Decreased awareness of safety Awareness: Intellectual, Emergent Problem Solving: Slow processing, Requires verbal cues General Comments: requires cues to stay on task, benefits from decisive cues. Pt states multiple times "I'm not gonna remember"           ADL:    Cognition: Cognition Overall Cognitive Status: Impaired/Different from baseline Arousal/Alertness: Awake/alert Orientation Level: Oriented X4 Sustained Attention: Impaired Memory: Impaired Memory Impairment: Decreased recall of new information Cognition Overall Cognitive Status: Impaired/Different from baseline   Blood pressure 111/80, pulse 79, temperature 97.9 F (36.6 C), resp. rate 16, height 5\' 4"  (1.626 m), weight 133.5 kg, SpO2 96%.   General:  No apparent distress, sitting in bedside chair, obese HEENT: Head is normocephalic, atraumatic, PERRLA, EOMI, sclera anicteric, oral mucosa pink and moist, + glasses Neck: Supple without JVD or lymphadenopathy Heart: Reg rate and rhythm. No murmurs rubs or gallops, wearing life vest  Chest: CTA bilaterally without wheezes, rales, or rhonchi; no distress Abdomen: Soft, non-tender, non-distended, bowel sounds positive. Extremities: large [name] wound covered in dressings with areas of serosanguineous noted Psych:Anxious appearing with rapid speech. Got highly anxious/panic stricken with dressing changed on left shin.   Skin: Clean and intact without signs of breakdown Neuro:  Alert and oriented x3, follows commands, cranial nerves II through XII intact, no speech or language deficits noted, + memory deficits Sensation intact light touch in all 4 extremities Strength 4+ out of 5 in bilateral upper extremities Strength 4+ out of 5 right lower extremity Strength 2-3 out of 5 left lower extremity Musculoskeletal: No joint swelling noted  Results for orders placed or performed during the hospital encounter of 12/17/22 (from the past 48 hour(s))  Glucose, capillary     Status: Abnormal   Collection Time: 12/17/22  9:05 PM  Result Value Ref Range   Glucose-Capillary 123 (H) 70 - 99 mg/dL    Comment: Glucose reference range applies only to samples taken after fasting for at least 8 hours.  Comprehensive metabolic panel      Status: Abnormal   Collection Time: 12/18/22  5:00 AM  Result Value Ref Range   Sodium 136 135 - 145 mmol/L   Potassium 3.5 3.5 - 5.1 mmol/L   Chloride 99 98 - 111 mmol/L   CO2 30 22 - 32 mmol/L   Glucose, Bld 125 (H) 70 - 99 mg/dL    Comment: Glucose reference range applies only to samples taken after fasting for at least 8 hours.   BUN 18 6 - 20 mg/dL   Creatinine, Ser 2.53 (H) 0.44 - 1.00 mg/dL   Calcium 8.8 (L) 8.9 - 10.3 mg/dL   Total Protein 6.1 (L) 6.5 - 8.1 g/dL   Albumin 2.8 (L) 3.5 - 5.0 g/dL   AST 23 15 - 41 U/L   ALT 26 0 - 44 U/L   Alkaline Phosphatase 42 38 - 126 U/L   Total Bilirubin 0.9 0.3 - 1.2 mg/dL   GFR, Estimated 56 (L) >60 mL/min    Comment: (NOTE) Calculated using the CKD-EPI Creatinine Equation (2021)    Anion gap 7 5 -  15    Comment: Performed at [facility], [address], [place], [place] [address]  CBC with Differential/Platelet     Status: None   Collection Time: 12/18/22  5:00 AM  Result Value Ref Range   WBC 10.0 4.0 - 10.5 K/uL   RBC 4.58 3.87 - 5.11 MIL/uL   Hemoglobin 12.9 12.0 - 15.0 g/dL   HCT 60.4 54.0 - 98.1 %   MCV 87.3 80.0 - 100.0 fL   MCH 28.2 26.0 - 34.0 pg   MCHC 32.3 30.0 - 36.0 g/dL   RDW 19.1 47.8 - 29.5 %   Platelets 283 150 - 400 K/uL   nRBC 0.0 0.0 - 0.2 %   Neutrophils Relative % 66 %   Neutro Abs 6.8 1.7 - 7.7 K/uL   Lymphocytes Relative 22 %   Lymphs Abs 2.2 0.7 - 4.0 K/uL   Monocytes Relative 8 %   Monocytes Absolute 0.8 0.1 - 1.0 K/uL   Eosinophils Relative 2 %   Eosinophils Absolute 0.2 0.0 - 0.5 K/uL   Basophils Relative 1 %   Basophils Absolute 0.1 0.0 - 0.1 K/uL   Immature Granulocytes 1 %   Abs Immature Granulocytes 0.05 0.00 - 0.07 K/uL    Comment: Performed at [facility], [address] [place], [place] [address]  Glucose, capillary     Status: Abnormal   Collection Time: 12/18/22  6:35 AM  Result Value Ref Range   Glucose-Capillary 121 (H) 70 - 99 mg/dL    Comment: Glucose reference  range applies only to samples taken after fasting for at least 8 hours.   *Note: Due to a large number of results and/or encounters for the requested time period, some results have not been displayed. A complete set of results can be found in Results Review.   No results found.    Blood pressure 111/80, pulse 79, temperature 97.9 F (36.6 C), resp. rate 16, height 5\' 4"  (1.626 m), weight 133.5 kg, SpO2 96%.  Medical Problem List and Plan: 1. 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 Continue life vest   -patient may shower  -ELOS/Goals: 10-14 day,  PT/OT supervision to mod I, SLP mod I   -Admit to CIR  2.  Antithrombotics: -DVT/anticoagulation:  Pharmaceutical: Lovenox  -antiplatelet therapy: N/A 3. Pain Management:  Oxycodone prn for pain control.  4. Mood/Behavior/Sleep: Team to provide ego support. LCSW to follow for evaluation and support.   --Melatonin prn for insomnia.   -antipsychotic agents: N/A at this time   --Neuropsych for coping skills.  5. Neuropsych/cognition: This patient is likely capable of making decisions on her own behalf. 6. Skin/Wound Care: Added proteiin supplements and Vitamin C to promote wound healing  --BID dressing changes with silvadene per plastics 7. Fluids/Electrolytes/Nutrition: Strict I/O. Low salt diet. Juven and prostat added  --Recheck CMET in am 8. VT/VF arrest: Life Vest with plans for ICD once leg wound resolves?  9. NICM w/ HFrEF:  GDMT being resumed by Cards--on Aldactone, Lasix, Cozaar, Farxiga and Lipitor  --Daily wts and monitor for signs of overload. Strict I/O  10. [name] wound with necrosis: Plastic consulted 10/22-->now on silvadene bid for local care --supplements added to promote wound healing.  11.  T2DM: Hgb A1c-  Was pm metformin/jardiance combo and Monjuaro PTA  --monitor BS ac/hs. AKI has resolved-->resume low dose metformin and titrate as indicated 12.  Acute renal failure vs CKD IIIa:  Resolving  13.  H/o severe recurrent depression w/anxiety: Vraylar and Lexapro discontinued due to concerns of QT prolongation  --Is being followed by behavioral health/Dr. Elsie Saas. Had [facility] urgent care visit 09/25/22 --continue Xanax prn   14. OSA: Continue CPAP 15. H/o Lumbar spinal stenosis and  R-knee pain:  Resume gabapentin (mood and pain). She was going to outpatient therapy.   16. Morbid Obesity BMI-50.5: Multifactorial. Encourage/educate on life style and diet changes to help with wt loss and improve overall activity and health. .  17. ADD: Discussed need for activating medication (mood and energy per char review)--has ritalin on home meds but just approved  --Ritalin added per secure chat with Dr. Bjorn Pippin.  18. Vaginal discomfort: Likely due to candidiasis --miconazole added for treatment.  19.  Hyperlipidemia: continue statin   Jacquelynn Cree, PA-C 12/17/2022   I have personally performed a face to face diagnostic evaluation of this patient and formulated the key components of the plan.  Additionally, I have personally reviewed laboratory data, imaging studies, as well as relevant notes and concur with the physician assistant's documentation above.  The patient's status has not changed from the original H&P.  Any changes in documentation from the acute care chart have been noted above.  Fanny Dance, MD, [place]

## 2022-12-18 NOTE — Plan of Care (Signed)
  Problem: RH Problem Solving Goal: LTG Patient will demonstrate problem solving for (SLP) Description: LTG:  Patient will demonstrate problem solving for basic/complex daily situations with cues  (SLP) Flowsheets (Taken 12/18/2022 1152) LTG: Patient will demonstrate problem solving for (SLP): Complex daily situations LTG Patient will demonstrate problem solving for: Modified Independent   Problem: RH Memory Goal: LTG Patient will demonstrate ability for day to day (SLP) Description: LTG:   Patient will demonstrate ability for day to day recall/carryover during cognitive/linguistic activities with assist  (SLP) Flowsheets (Taken 12/18/2022 1152) LTG: Patient will demonstrate ability for day to day recall: Daily complex information LTG: Patient will demonstrate ability for day to day recall/carryover during cognitive/linguistic activities with assist (SLP): Modified Independent Goal: LTG Patient will use memory compensatory aids to (SLP) Description: LTG:  Patient will use memory compensatory aids to recall biographical/new, daily complex information with cues (SLP) Flowsheets (Taken 12/18/2022 1152) LTG: Patient will use memory compensatory aids to (SLP): Modified Independent   Problem: RH Attention Goal: LTG Patient will demonstrate this level of attention during functional activites (SLP) Description: LTG:  Patient will will demonstrate this level of attention during functional activites (SLP) Flowsheets (Taken 12/18/2022 1152) Patient will demonstrate during cognitive/linguistic activities the attention type of: Selective Patient will demonstrate this level of attention during cognitive/linguistic activities in: Community LTG: Patient will demonstrate this level of attention during cognitive/linguistic activities with assistance of (SLP): Modified Independent

## 2022-12-18 NOTE — Progress Notes (Signed)
Inpatient Rehabilitation Care Coordinator Assessment and Plan Patient Details  Name: [name] MRN: [id number] Date of Birth: 09/06/1965  Today's Date: 12/18/2022  Hospital Problems: Principal Problem:   Debility  Past Medical History:  Past Medical History:  Diagnosis Date   ADD (attention deficit disorder)    Anxiety    Arthritis    both knees right worse than left   Carpal tunnel syndrome of right wrist    Depression    Essential hypertension, benign    Gallstones    History of smoking 06/22/2016   Hyperlipidemia associated with type 2 diabetes mellitus (HCC), on Zocor 04/04/2011   Hypertension associated with diabetes (HCC) 06/03/2008   Migraines    Morbid obesity (HCC)    Morbid obesity with BMI of 50.0-59.9, adult (HCC) 07/12/2006   NICM (nonischemic cardiomyopathy) (HCC) 07/12/2006   01/16/18 ECHO:    - Procedure narrative: Transthoracic echocardiography. Image   quality was suboptimal. The study was technically difficult.   Intravenous contrast (Definity) was administered. - Left ventricle: The cavity size was moderately dilated. Wall   thickness was increased in a pattern of mild LVH. Systolic   function was moderately to severely reduced. The estimated   ejection fraction w   Normal coronary arteries 04/17/2016   OSA on CPAP 10/28/2014   Spinal stenosis    back pain   Spondylosis without myelopathy or radiculopathy, lumbar region 12/04/2017   Type 2 diabetes mellitus with hyperglycemia [facility])    Past Surgical History:  Past Surgical History:  Procedure Laterality Date   CHOLECYSTECTOMY     DILATATION & CURETTAGE/HYSTEROSCOPY WITH MYOSURE N/A 10/31/2017   Procedure: DILATATION & CURETTAGE/HYSTEROSCOPY WITH MYOSURE;  Surgeon: Romualdo Bolk, MD;  Location: [facility] ORS;  Service: Gynecology;  Laterality: N/A;   HYSTEROSCOPY WITH D & C N/A 05/07/2022   Procedure: DILATATION AND CURETTAGE /HYSTEROSCOPY;  Surgeon: [name], MD;  Location: [facility] OR;  Service: Gynecology;   Laterality: N/A;   RIGHT/LEFT HEART CATH AND CORONARY ANGIOGRAPHY N/A 04/11/2016   Procedure: Right/Left Heart Cath and Coronary Angiography;  Surgeon: Kathleene Hazel, MD;  Location: [facility] INVASIVE CV LAB;  Service: Cardiovascular;  Laterality: N/A;   RIGHT/LEFT HEART CATH AND CORONARY ANGIOGRAPHY N/A 12/09/2022   Procedure: RIGHT/LEFT HEART CATH AND CORONARY ANGIOGRAPHY;  Surgeon: Yates Decamp, MD;  Location: [facility] INVASIVE CV LAB;  Service: Cardiovascular;  Laterality: N/A;   sonogram for blood clots     no blockages   Social History:  reports that she quit smoking about 25 years ago. Her smoking use included cigarettes. She has never used smokeless tobacco. She reports that she does not drink alcohol and does not use drugs.  Family / Support Systems Marital Status: Married Patient Roles: Spouse, Parent Spouse/Significant Other: [name] [phone] Children: Grown child out of the home Anticipated Caregiver: [name] Ability/Limitations of Caregiver: Husband was providing supervision-min prior to admission to the hospital. Does not work so pt can have her health insurance Caregiver Availability: 24/7 Family Dynamics: Close with husband has limited supports but feels they are strong and rely mostly upon one another  Social History Preferred language: English Religion: Muslim Cultural Background: No issues Education: HS Health Literacy - How often do you need to have someone help you when you read instructions, pamphlets, or other written material from your doctor or pharmacy?: Never Writes: Yes Employment Status: Disabled Marine scientist Issues: No issues Guardian/Conservator: None-according to MD pt is capable of making her own decisions while here   Abuse/Neglect Abuse/Neglect Assessment  Can Be Completed: Yes Physical Abuse: Denies Verbal Abuse: Denies Sexual Abuse: Denies Exploitation of patient/patient's resources: Denies Self-Neglect: Denies  Patient response  to: Social Isolation - How often do you feel lonely or isolated from those around you?: Sometimes  Emotional Status Pt's affect, behavior and adjustment status: Pt is trying to process all that has happened to her and had memory issues from before. She hopes to do well and recover and be ambulatory she hopes to get to the pool at [facility]. She has a history of depression and this can bring her down Recent Psychosocial Issues: other health issues back and knee issues and memory issues Psychiatric History: history of depression and ADHD which pt feels limits her and she is not good with new information. Have placed on neuro-psych list to be seen while here Her ritalin was re-started for her ADHD Substance Abuse History: No issues  Patient / Family Perceptions, Expectations & Goals Pt/Family understanding of illness & functional limitations: Pt and husband can explain her medical issues-pt at times does not remember what happened to her and husband reminds her. She does talk with the MD's involved and wants to be kept updated on her medical issues Premorbid pt/family roles/activities: wife, mom, retiree, etc Anticipated changes in roles/activities/participation: resume Pt/family expectations/goals: Pt states: " I hope I can move better when I leave here."  Husband states: " I will assist her but I know it is important for her to do for herself."  [employer]: Other (Comment) Premorbid Home Care/DME Agencies: Other (Comment) (scooter, rw, tub seat and was getting PT at [facility]) Transportation available at discharge: husband Is the patient able to respond to transportation needs?: Yes In the past 12 months, has lack of transportation kept you from medical appointments or from getting medications?: No In the past 12 months, has lack of transportation kept you from meetings, work, or from getting things needed for daily living?: No Resource referrals  recommended: Neuropsychology  Discharge Planning Living Arrangements: Spouse/significant other Support Systems: Spouse/significant other, Children Type of Residence: Private residence Insurance Resources: [employer], [employer] (specify county) Architect: SSD, Family Support Financial Screen Referred: No Living Expenses: Psychologist, sport and exercise Management: Spouse Does the patient have any problems obtaining your medications?: No Home Management: husband Patient/Family Preliminary Plans: Return home with husband who was assisting her prior to admission. She hopes to do well here. Being evaluated today and goals being set for stay here. Care Coordinator Anticipated Follow Up Needs: HH/OP  Clinical Impression Pleasant female who seems to be overwhelmed with all of the new faces she is seeing. Will give her time to adjust as she continues to process all that has happened to her. Her husband is a strong support and will assist at discharge.  Lucy Chris 12/18/2022, 2:45 PM

## 2022-12-18 NOTE — Patient Care Conference (Signed)
Inpatient RehabilitationTeam Conference and Plan of Care Update Date: 12/18/2022   Time: 12:12 PM    Patient Name: [name]      Medical Record Number: [id number]  Date of Birth: Jul 31, 1965 Sex: Female         Room/Bed: 4M08C/4M08C-01 Payor Info: Payor: MEDICARE / Plan: MEDICARE PART A AND B / Product Type: *No Product type* /    Admit Date/Time:  12/17/2022  6:31 PM  Primary Diagnosis:  Debility  Hospital Problems: Principal Problem:   Debility    Expected Discharge Date: Expected Discharge Date:  (ELOS 10-12 days; PT eval pending)  Team Members Present: Physician leading conference: Dr. Fanny Dance Social Worker Present: Dossie Der, LCSW Nurse Present: Chana Bode, RN PT Present: Raechel Chute, PT OT Present: Lou Cal, OT SLP Present: Jeannie Done, SLP PPS Coordinator present : Fae Pippin, SLP     Current Status/Progress Goal Weekly Team Focus  Bowel/Bladder   continent of b/b   maintain continence   offer toileting q2 hrs and  PRN    Swallow/Nutrition/ Hydration               ADL's   Setup/supervision for UB ADLs, Min A for toileting, Mod A for LB ADLs, functional reach impacted by chest pain.   Supervision due to [employer]   AE as appropriate for LB ADLs.    Mobility      Evals pending         Communication                Safety/Cognition/ Behavioral Observations  likely close to baseline, deficits in memory, attention, and problem solving, full eval pending   modI   memory, attention, problem solving    Pain   complains of pain from compressions, pain bilat scapulae and pain in L leg from trauma. states medication is not taking the pain away. states she usually needs twice as much as others   get pain >5   use PRN pain medication and adjusting patient ice/heat    Skin   large trauma/injury on left leg and under right breast skin reacted to adhesive. also fissure on bottom   maintain infection free wounds  assess  wounds qshift and PRN. Perform dressing changes per orders. look for s/s of infection      Discharge Planning:  New evaluation home with husband need to confirm if will have 24/7 care   Team Discussion: Patient post cardia arrest with anoxic brain injury. Pre-morbid emory deficits (spouse managed medications and finances PTA) and ADHD/depression on Ritalin. Uncontrolled DM and CKD. Limited by cognitive deficits and pain/chest soreness.  Patient on target to meet rehab goals: Evals pending. Needs set up for upper body care and min assist for toileting and mod assist for lower body care.   *See Care Plan and progress notes for long and short-term goals.   Revisions to Treatment Plan:  N/A   Teaching Needs: Safety, medications, skin care, Life Vest wear/care, transfers, toileting, etc.   Current Barriers to Discharge: Home enviroment access/layout and Wound care; 1/2 bath on main; sponge bathed PTA  Possible Resolutions to Barriers: Family education OP follow up services DME: TTB, Bariatric BSC     Medical Summary Current Status: debility, VT, VF, NCIM, OSA, Obesity, ADHD, HLD  Barriers to Discharge: Behavior/Mood;Cardiac Complications;Medical stability;Morbid Obesity;Uncontrolled Pain;Complicated Wound  Barriers to Discharge Comments: debility, VT, VF, NCIM, OSA, Obesity, ADHD, HLD, CKD vs AKI Possible Resolutions to [employer] Focus: Life vest, wound  care, pain control,monitor Labs, monitor CBGs   Continued Need for Acute Rehabilitation Level of Care: The patient requires daily medical management by a physician with specialized training in physical medicine and rehabilitation for the following reasons: Direction of a multidisciplinary physical rehabilitation program to maximize functional independence : Yes Medical management of patient stability for increased activity during participation in an intensive rehabilitation regime.: Yes Analysis of laboratory values and/or  radiology reports with any subsequent need for medication adjustment and/or medical intervention. : Yes   I attest that I was present, lead the team conference, and concur with the assessment and plan of the team.   Chana Bode B 12/18/2022, 3:54 PM

## 2022-12-18 NOTE — Progress Notes (Signed)
Inpatient Rehabilitation  Patient information reviewed and entered into eRehab system by Oyuki Hogan M. Eri Mcevers, M.A., CCC/SLP, PPS Coordinator.  Information including medical coding, functional ability and quality indicators will be reviewed and updated through discharge.    

## 2022-12-18 NOTE — Plan of Care (Signed)
  Problem: RH Balance Goal: LTG Patient will maintain dynamic sitting balance (PT) Description: LTG:  Patient will maintain dynamic sitting balance with assistance during mobility activities (PT) Flowsheets (Taken 12/18/2022 1259) LTG: Pt will maintain dynamic sitting balance during mobility activities with:: Independent Goal: LTG Patient will maintain dynamic standing balance (PT) Description: LTG:  Patient will maintain dynamic standing balance with assistance during mobility activities (PT) Flowsheets (Taken 12/18/2022 1259) LTG: Pt will maintain dynamic standing balance during mobility activities with:: Supervision/Verbal cueing   Problem: Sit to Stand Goal: LTG:  Patient will perform sit to stand with assistance level (PT) Description: LTG:  Patient will perform sit to stand with assistance level (PT) Flowsheets (Taken 12/18/2022 1259) LTG: PT will perform sit to stand in preparation for functional mobility with assistance level: Independent with assistive device   Problem: RH Bed Mobility Goal: LTG Patient will perform bed mobility with assist (PT) Description: LTG: Patient will perform bed mobility with assistance, with/without cues (PT). Flowsheets (Taken 12/18/2022 1259) LTG: Pt will perform bed mobility with assistance level of: Independent with assistive device    Problem: RH Bed to Chair Transfers Goal: LTG Patient will perform bed/chair transfers w/assist (PT) Description: LTG: Patient will perform bed to chair transfers with assistance (PT). Flowsheets (Taken 12/18/2022 1259) LTG: Pt will perform Bed to Chair Transfers with assistance level: Supervision/Verbal cueing   Problem: RH Car Transfers Goal: LTG Patient will perform car transfers with assist (PT) Description: LTG: Patient will perform car transfers with assistance (PT). Flowsheets (Taken 12/18/2022 1259) LTG: Pt will perform car transfers with assist:: Contact Guard/Touching assist   Problem: RH  Ambulation Goal: LTG Patient will ambulate in controlled environment (PT) Description: LTG: Patient will ambulate in a controlled environment, # of feet with assistance (PT). Flowsheets (Taken 12/18/2022 1259) LTG: Pt will ambulate in controlled environ  assist needed:: Supervision/Verbal cueing LTG: Ambulation distance in controlled environment: 150 ft Goal: LTG Patient will ambulate in home environment (PT) Description: LTG: Patient will ambulate in home environment, # of feet with assistance (PT). Flowsheets (Taken 12/18/2022 1259) LTG: Pt will ambulate in home environ  assist needed:: Supervision/Verbal cueing LTG: Ambulation distance in home environment: 50 ft   Problem: RH Stairs Goal: LTG Patient will ambulate up and down stairs w/assist (PT) Description: LTG: Patient will ambulate up and down # of stairs with assistance (PT) Flowsheets (Taken 12/18/2022 1810) LTG: Pt will ambulate up/down stairs assist needed:: Supervision/Verbal cueing LTG: Pt will  ambulate up and down number of stairs: 1

## 2022-12-18 NOTE — Progress Notes (Signed)
Inpatient Rehabilitation Center Individual Statement of Services  Patient Name:  [name]  Date:  12/18/2022  Welcome to the Inpatient Rehabilitation Center.  Our goal is to provide you with an individualized program based on your diagnosis and situation, designed to meet your specific needs.  With this comprehensive rehabilitation program, you will be expected to participate in at least 3 hours of rehabilitation therapies Monday-Friday, with modified therapy programming on the weekends.  Your rehabilitation program will include the following services:  Physical Therapy (PT), Occupational Therapy (OT), Speech Therapy (ST), 24 hour per day rehabilitation nursing, Therapeutic Recreaction (TR), Neuropsychology, Care Coordinator, Rehabilitation Medicine, Nutrition Services, and Pharmacy Services  Weekly team conferences will be held on Wednesday to discuss your progress.  Your Inpatient Rehabilitation Care Coordinator will talk with you frequently to get your input and to update you on team discussions.  Team conferences with you and your family in attendance may also be held.  Expected length of stay: 10-14 days  Overall anticipated outcome: supervision-CGA level  Depending on your progress and recovery, your program may change. Your Inpatient Rehabilitation Care Coordinator will coordinate services and will keep you informed of any changes. Your Inpatient Rehabilitation Care Coordinator's name and contact numbers are listed  below.  The following services may also be recommended but are not provided by the Inpatient Rehabilitation Center:   Home Health Rehabiltiation Services Outpatient Rehabilitation Services    Arrangements will be made to provide these services after discharge if needed.  Arrangements include referral to agencies that provide these services.  Your insurance has been verified to be:  Medicare & medicaid Your primary doctor is:  Sanda Linger  Pertinent information will  be shared with your doctor and your insurance company.  Inpatient Rehabilitation Care Coordinator:  Dossie Der, [place] [phone] or Luna [place]  Information discussed with and copy given to patient by: Lucy Chris, 12/18/2022, 2:47 PM

## 2022-12-18 NOTE — Evaluation (Signed)
Occupational Therapy Assessment and Plan  Patient Details  Name: [name] MRN: [id number] Date of Birth: 15-Apr-1965  OT Diagnosis: acute pain, lumbago (low back pain), muscle weakness (generalized), pain in joint, and decreased activity tolerance Rehab Potential: Rehab Potential (ACUTE ONLY): Good ELOS: 10-12 days   Today's Date: 12/18/2022 OT Individual Time: 9562-1308 OT Individual Time Calculation (min): 71 min     Today's Date: 12/18/2022 OT Individual Time: 1432-1500 OT Individual Time Calculation (min): 28 min     Hospital Problem: Principal Problem:   Debility   Past Medical History:  Past Medical History:  Diagnosis Date   ADD (attention deficit disorder)    Anxiety    Arthritis    both knees right worse than left   Carpal tunnel syndrome of right wrist    Depression    Essential hypertension, benign    Gallstones    History of smoking 06/22/2016   Hyperlipidemia associated with type 2 diabetes mellitus (HCC), on Zocor 04/04/2011   Hypertension associated with diabetes (HCC) 06/03/2008   Migraines    Morbid obesity (HCC)    Morbid obesity with BMI of 50.0-59.9, adult (HCC) 07/12/2006   NICM (nonischemic cardiomyopathy) (HCC) 07/12/2006   01/16/18 ECHO:    - Procedure narrative: Transthoracic echocardiography. Image   quality was suboptimal. The study was technically difficult.   Intravenous contrast (Definity) was administered. - Left ventricle: The cavity size was moderately dilated. Wall   thickness was increased in a pattern of mild LVH. Systolic   function was moderately to severely reduced. The estimated   ejection fraction w   Normal coronary arteries 04/17/2016   OSA on CPAP 10/28/2014   Spinal stenosis    back pain   Spondylosis without myelopathy or radiculopathy, lumbar region 12/04/2017   Type 2 diabetes mellitus with hyperglycemia [facility])    Past Surgical History:  Past Surgical History:  Procedure Laterality Date   CHOLECYSTECTOMY     DILATATION &  CURETTAGE/HYSTEROSCOPY WITH MYOSURE N/A 10/31/2017   Procedure: DILATATION & CURETTAGE/HYSTEROSCOPY WITH MYOSURE;  Surgeon: Romualdo Bolk, MD;  Location: [facility] ORS;  Service: Gynecology;  Laterality: N/A;   HYSTEROSCOPY WITH D & C N/A 05/07/2022   Procedure: DILATATION AND CURETTAGE /HYSTEROSCOPY;  Surgeon: [name], MD;  Location: [facility] OR;  Service: Gynecology;  Laterality: N/A;   RIGHT/LEFT HEART CATH AND CORONARY ANGIOGRAPHY N/A 04/11/2016   Procedure: Right/Left Heart Cath and Coronary Angiography;  Surgeon: Kathleene Hazel, MD;  Location: [facility] INVASIVE CV LAB;  Service: Cardiovascular;  Laterality: N/A;   RIGHT/LEFT HEART CATH AND CORONARY ANGIOGRAPHY N/A 12/09/2022   Procedure: RIGHT/LEFT HEART CATH AND CORONARY ANGIOGRAPHY;  Surgeon: Yates Decamp, MD;  Location: [facility] INVASIVE CV LAB;  Service: Cardiovascular;  Laterality: N/A;   sonogram for blood clots     no blockages    Assessment & Plan Clinical Impression: Patient is a 57 year old female with history of HTN, NICM w/chronic HFrEF, morbid obesity-_BMI 50, OSA, T2DM, chronic LBP and right knee pain, depression/anxiety who was admitted on 12/05/22 after witnessed VF cardiac arrest with bystander CPR, prolonged ACLS protocol X 40 minutes, started on IV amiodarone, was unresponsive and intubated on arrival to ED.   Hospital course significant for lactic acidosis with hypotension requiring pressors and elevated trops felt to be due to demand ischemia. She tolerated extubation  without difficulty 10/12 and repeat Echo showed EF 30-35% with trivial MR. CT head negative. Elevated WBC felt to be reactive as BC, CXR, GI panel and UA  negative. Acute respiratory failure felt to be due to aspiration PNA and was treated with IV ceftriaxone. She received intraosseous IVF management and was found to have developed skin necrosis left leg with swelling and concerns of compartment syndrome. Dr. Charlann Boxer was consulted and did not feel like she had  indications of compartment syndrome.  WOC consulted multiple blisters unroofed with orders for local care with Xeroform gauze and daily dressing changes. Dr, Girard Cooter consulted and felt that cardiac arrest with unclear cause in setting of long standing CM and ICD recommended.  Cardiac MRI and cardiac cath negative for CAD and recommendations for ICD once leg wound resolves.  She has had left upper chest pain felt to be MS in nature and warm compresses added. Do be started on ritalin for ADHD (new medication) Patient reports memory deficits particular in the days after her cardiac arrest. Patient transferred to CIR on 12/17/2022 .    Patient currently requires Setup/Supervision for UB ADLs, Min-Mod A for LB ADLs, and CGA for functional transfers muscle weakness, decreased cardiorespiratoy endurance, decreased memory and delayed processing, and decreased standing balance and decreased balance strategies.  Prior to hospitalization, patient could complete BADLs with Min A from spouse for LB care.   Patient will benefit from skilled intervention to decrease level of assist with basic self-care skills and increase independence with basic self-care skills prior to discharge home with care partner.  Anticipate patient will require 24 hour supervision and follow up home health.  OT - End of Session Activity Tolerance: Tolerates < 10 min activity with changes in vital signs Endurance Deficit: Yes Endurance Deficit Description: decreased OT Assessment Rehab Potential (ACUTE ONLY): Good OT Barriers to Discharge: Inaccessible home environment;Home environment access/layout;Wound Care;Weight;Pending surgery OT Patient demonstrates impairments in the following area(s): Balance;Edema;Endurance;Pain;Safety;Skin Integrity OT Basic ADL's Functional Problem(s): Bathing;Dressing;Toileting OT Transfers Functional Problem(s): Toilet;Tub/Shower OT Plan OT Intensity: Minimum of 1-2 x/day, 45 to 90 minutes OT Frequency: 5  out of 7 days OT Duration/Estimated Length of Stay: 10-12 days OT Treatment/Interventions: Balance/vestibular training;Cognitive remediation/compensation;Community reintegration;Discharge planning;Disease mangement/prevention;DME/adaptive equipment instruction;Functional mobility training;Neuromuscular re-education;Pain management;Patient/family education;Functional electrical stimulation;Self Care/advanced ADL retraining;Skin care/wound managment;Splinting/orthotics;Therapeutic Activities;Therapeutic Exercise;Psychosocial support;UE/LE Strength taining/ROM;UE/LE Coordination activities;Visual/perceptual remediation/compensation;Wheelchair propulsion/positioning OT Basic Self-Care Anticipated Outcome(s): Supervision OT Toileting Anticipated Outcome(s): Supervision OT Bathroom Transfers Anticipated Outcome(s): Supervision OT Recommendation Recommendations for Other Services: Neuropsych consult;Therapeutic Recreation consult Therapeutic Recreation Interventions: Pet therapy;Stress management Patient destination: Home Follow Up Recommendations: Home health OT Equipment Recommended: To be determined   OT Evaluation Precautions/Restrictions  Precautions Precautions: Fall Precaution Comments: Life Vest, Falls, Chronic low back and R-knee pain Restrictions Weight Bearing Restrictions: No RUE Weight Bearing: Weight bearing as tolerated General Chart Reviewed: Yes Family/Caregiver Present: Yes (Spouse) Vital Signs Therapy Vitals Pulse Rate: 79 BP: 111/80 Patient Position (if appropriate): Sitting Oxygen Therapy SpO2: 96 % O2 Device: Room Air Pain Pain Assessment Pain Scale: 0-10 Pain Score: 8  Pain Type: Acute pain;Surgical pain Pain Location: Chest Pain Orientation: Anterior Pain Descriptors / Indicators: Constant;Radiating Pain Onset: On-going Pain Intervention(s): Distraction Multiple Pain Sites: Yes 2nd Pain Site Pain Score: 8 Pain Type: Acute pain Pain Location: Leg Pain  Orientation: Left Pain Descriptors / Indicators: Aching;Burning Home Living/Prior Functioning Home Living Family/patient expects to be discharged to:: Private residence Living Arrangements: Spouse/significant other Available Help at Discharge: Family, Available 24 hours/day Type of Home: House Home Access: Stairs to enter (railings not sturdy, entrance 1 step up, full flight(15-20 turn then 4-5) to go upstairs in home) Entrance Stairs-Number of Steps: 1  Entrance Stairs-Rails: Left, Right Home Layout: Two level, 1/2 bath on main level, Bed/bath upstairs Alternate Level Stairs-Rails: Left, Right Bathroom Shower/Tub: Tub/shower unit Bathroom Toilet: Standard Bathroom Accessibility: No Additional Comments: uses scooter if going to a large store  Lives With: Spouse, Son IADL History Homemaking Responsibilities: No Occupation: On disability Prior Function Level of Independence: Needs assistance with ADLs Bath:  (hand bathing downstairs most of the time) Dressing: Minimal (Socks & shoes)  Able to Take Stairs?: Yes (difficult) Driving: No Vocation: On disability Vision Baseline Vision/History: 1 Wears glasses (reading glasses) Ability to See in Adequate Light: 0 Adequate Patient Visual Report: No change from baseline Vision Assessment?: No apparent visual deficits Perception  Perception: Within Functional Limits Praxis Praxis: WFL Cognition Cognition Overall Cognitive Status: History of cognitive impairments - at baseline Arousal/Alertness: Awake/alert Orientation Level: Place Memory: Impaired Memory Impairment: Decreased recall of new information Attention: Sustained;Selective Sustained Attention: Impaired Sustained Attention Impairment: Verbal basic Awareness: Impaired Awareness Impairment: Intellectual impairment Problem Solving: Impaired Problem Solving Impairment: Verbal basic;Functional basic Executive Function: Reasoning Reasoning: Impaired Reasoning Impairment:  Verbal basic;Functional basic Safety/Judgment: Appears intact Brief Interview for Mental Status (BIMS) Repetition of Three Words (First Attempt): 3 Temporal Orientation: Year: Correct Temporal Orientation: Month: Accurate within 5 days Temporal Orientation: Day: No answer Recall: "Sock": No, could not recall Recall: "Blue": Yes, no cue required Recall: "Bed": Yes, after cueing ("a piece of furniture") BIMS Summary Score: 11 Sensation Sensation Light Touch: Impaired Detail Peripheral sensation comments: Pt reports neuropathic symptoms in B feet, Light Touch Impaired Details: Impaired [name];Impaired RLE Hot/Cold: Appears Intact Proprioception: Appears Intact Stereognosis: Appears Intact Coordination Gross Motor Movements are Fluid and Coordinated: No Fine Motor Movements are Fluid and Coordinated: Yes (Pt does report pain in R digits, palm, and wrist during extended gripping activities.) Coordination and Movement Description: Decreased due to generalized weakness/debility & [name] wound Motor  Motor Motor: Within Functional Limits  Trunk/Postural Assessment  Cervical Assessment Cervical Assessment: Exceptions to [facility] (Forward head) Thoracic Assessment Thoracic Assessment: Exceptions to [facility] (Rounded shoulders) Lumbar Assessment Lumbar Assessment: Exceptions to [facility] (Posterior pelvic tilt) Postural Control Postural Control: Within Functional Limits  Balance Balance Balance Assessed: Yes Static Sitting Balance Static Sitting - Balance Support: Feet supported Static Sitting - Level of Assistance: 6: Modified independent (Device/Increase time) Dynamic Sitting Balance Dynamic Sitting - Balance Support: Feet supported;During functional activity Dynamic Sitting - Level of Assistance: 5: Stand by assistance (CGA) Dynamic Sitting - Balance Activities: Lateral lean/weight shifting;Forward lean/weight shifting;Reaching for objects Static Standing Balance Static Standing - Balance Support:  Bilateral upper extremity supported;During functional activity Static Standing - Level of Assistance: 5: Stand by assistance (CGA) Dynamic Standing Balance Dynamic Standing - Balance Support: During functional activity;Bilateral upper extremity supported Dynamic Standing - Level of Assistance: 5: Stand by assistance (CGA) Dynamic Standing - Balance Activities: Lateral lean/weight shifting;Forward lean/weight shifting;Reaching for objects Dynamic Standing - Comments: during ambulation Extremity/Trunk Assessment RUE Assessment RUE Assessment: Exceptions to [facility] Active Range of Motion (AROM) Comments: Limited by pain, ~90 degrees flexion/abduction General Strength Comments: 3+/5 LUE Assessment LUE Assessment: Exceptions to [facility] Active Range of Motion (AROM) Comments: Limited by pain, ~90 degrees flexion/abduction General Strength Comments: 3+/5  Care Tool Care Tool Self Care--See flowsheets  Care Tool Cognition Expression of Ideas and Wants Expression of Ideas and Wants: 3. Some difficulty - exhibits some difficulty with expressing needs and ideas (e.g, some words or finishing thoughts) or speech is not clear  Understanding Verbal and Non-Verbal Content Understanding Verbal and Non-Verbal  Content: 4. Understands (complex and basic) - clear comprehension without cues or repetitions   Memory/Recall Ability Memory/Recall Ability : Current season;That he or she is in a hospital/hospital unit   Refer to Care Plan for Long Term Goals  SHORT TERM GOAL WEEK 1 OT Short Term Goal 1 (Week 1): STGs=LTGs due to patient's length of stay.  Recommendations for other services: Neuropsych and Therapeutic Recreation  Pet therapy and Stress management   Skilled Therapeutic Intervention ADL ADL Eating: Independent Where Assessed-Eating: Bed level Grooming: Setup Where Assessed-Grooming: Standing at sink Upper Body Bathing: Setup Where Assessed-Upper Body Bathing: Sitting at sink Lower Body Bathing:  Minimal assistance Where Assessed-Lower Body Bathing: Sitting at sink Upper Body Dressing: Setup (Setup but painful due to compresssions) Where Assessed-Upper Body Dressing: Sitting at sink Lower Body Dressing: Moderate assistance Where Assessed-Lower Body Dressing: Edge of bed Toileting: Minimal assistance Where Assessed-Toileting: Toilet;Bedside Commode Toilet Transfer: Furniture conservator/restorer Method: Proofreader: Grab bars;Extra wide bedside commode Aeronautical engineer) Tub/Shower Transfer: Unable to assess Film/video editor: Unable to assess Mobility  Transfers Sit to Stand: Contact Guard/Touching assist Stand to Sit: Contact Guard/Touching assist  Skilled Intervention:   Session 1: Pt received resting in bed for skilled OT session with focus on comprehensive OT evaluation. Pt agreeable to interventions, demonstrating overall pleasant mood. Pt reported 8/10 pain in low back and [name], medicated during session. OT offering intermediate rest breaks and positioning suggestions throughout session to address pain/fatigue and maximize participation/safety in session.   Session began with introduction to OT role, OT POC, and general orientation to rehab unit/schedule. Pt completes toileting and sponge-bathing at sink-side with levels of assistance noted above, pt performs functional transfers with CGA, Min A for management of Life Vest battery pack.   Pt remained sitting in [facility] with all immediate needs met at end of session. Pt continues to be appropriate for skilled OT intervention to promote further functional independence.   Session 2: Pt received on toilet with NT present, skilled OT session with focus on toileting aids. Pt agreeable to interventions, demonstrating overall pleasant mood. Pt with un-rated pain in low back. OT offering intermediate rest breaks and positioning suggestions throughout session to address pain/fatigue and maximize participation/safety in  session.   Pt completes toileting with supervision + RW, ambulating to sink-side for washing hands with CGA + RW. Seated back on WC, pt and OT discuss functional reach for posterior peri-area. OT educating on toileting aids including tongs and portable bidet. Handouts provided.  Pt remained sitting in [facility] with all immediate needs met at end of session. Pt continues to be appropriate for skilled OT intervention to promote further functional independence.   Discharge Criteria: Patient will be discharged from OT if patient refuses treatment 3 consecutive times without medical reason, if treatment goals not met, if there is a change in medical status, if patient makes no progress towards goals or if patient is discharged from hospital.  The above assessment, treatment plan, treatment alternatives and goals were discussed and mutually agreed upon: by patient  Lou Cal, OTR/L, MSOT  12/18/2022, 12:01 PM

## 2022-12-18 NOTE — Progress Notes (Signed)
Inpatient Rehabilitation Admission Medication Review by a Pharmacist  A complete drug regimen review was completed for this patient to identify any potential clinically significant medication issues.  High Risk Drug Classes Is patient taking? Indication by Medication  Antipsychotic Yes, as an intravenous medication Prochlorperazine - PRN nausea  Anticoagulant No   Antibiotic/Antifungal Yes Clotrimazole - yeast infection  Opioid Yes Oxycodone - PRN pain  Antiplatelet No   Hypoglycemics/insulin Yes Dapagliflozin - CHF, DM Insulin aspart, metformin - DM  Vasoactive Medication Yes Metoprolol, Losartan, Spironolactone - CHF, HTN Furosemide - CHF  Chemotherapy No   Other Yes Vit C - supplement Atorvastatin - HLD Diclofenac gel - topical pain relief Gabapentin - neuropathy Methylphenidate - attention/alertness Alprazolam, hydroxyzine - PRN anxiety Trazodone - PRN sleep      Type of Medication Issue Identified Description of Issue Recommendation(s)  Drug Interaction(s) (clinically significant)     Duplicate Therapy     Allergy     No Medication Administration End Date     Incorrect Dose     Additional Drug Therapy Needed     Significant med changes from prior encounter (inform family/care partners about these prior to discharge). Coreg > metoprolol Xigduo Dcd (individually reordered) Entresto > losartan Simvastatin > atorvastatin  Mounjaro held during CIR admission  Vraylar, escitalopram discontinued due to Qtc prolongation risk Communicate medication changes with patient/family at discharge  Other       Clinically significant medication issues were identified that warrant physician communication and completion of prescribed/recommended actions by midnight of the next day:  No   Pharmacist comments:  would not take her evening doses of any medications; medication regimen for discharge simplified to once daily dosing per cardiology   Time spent performing this drug  regimen review (minutes): 20   Thank you for allowing pharmacy to be a part of this patient's care.  Thelma Barge, PharmD Clinical Pharmacist

## 2022-12-18 NOTE — Progress Notes (Signed)
   12/17/22 2256  BiPAP/CPAP/SIPAP  Reason BIPAP/CPAP not in use Non-compliant

## 2022-12-18 NOTE — Progress Notes (Signed)
Initial Nutrition Assessment  DOCUMENTATION CODES:   Morbid obesity  INTERVENTION:  -Continue Juven bid for conditionally essential amino acids to aide in wound healing.  -Continue Beneprotein 1 scoop (6 g) tid to help meet estimated protein needs.  -Continue vitamin C 500 mg for micronutrient support for wound healing  -Diet education provided today, needs to be reviewed while spouse is present as patient does not seem to be retaining information. Outpatient referral submitted.   NUTRITION DIAGNOSIS:   Food and nutrition related knowledge deficit related to limited prior education as evidenced by per patient/family report, other (comment) (consult for diet education).   GOAL:   Patient will meet greater than or equal to 90% of their needs    MONITOR:   PO intake, Skin, Supplement acceptance, Labs, Weight trends  REASON FOR ASSESSMENT:   Consult Wound healing, Assessment of nutrition requirement/status, Diet education  ASSESSMENT: 57 y/o female admitted 12/05/22 after a witnessed cardiac arrest with bystander CPR, prolonged ACLS protocol x40 minutes, intubated on arrival to ED. Required pressor support, extubated 10/12.   PMH: HTN, NICM with chronic heart failure, morbid obesity, OSA, T2DM, chronic LBP, depression, anxiety, remote smoking hx.  Patient received diet education for diabetes 10/18 with husband at bedside. She does not recall most of the information given previously.   She has followed "[employer]" meal plan in the past and therefore familiar where the CHO food sources, portion sizes. Referral sent for outpatient diabetic education on 10/18. Today we reviewed diet basics for consistent CHO meal plan. Also discussed nutrition recommendations to support wound healing. She is receiving Juven and Beneprotein which she does not care for however, the rationale explained and patient verbalizes intention to consume.  Medications reviewed and include vitamin C 500 mg  daily, lasix, novolog SS with meals and bedtime, metformin 250 mg bid, spironolactone,   Labs: CBG 109-169 past 24 hr, hgb A1C 6.6%    NUTRITION - FOCUSED PHYSICAL EXAM:  Flowsheet Row Most Recent Value  Orbital Region No depletion  Upper Arm Region No depletion  Thoracic and Lumbar Region No depletion  Buccal Region No depletion  Temple Region Mild depletion  Clavicle Bone Region No depletion  Clavicle and Acromion Bone Region No depletion  Scapular Bone Region Unable to assess  [unable to reach scapular area, patient unable to reposition.]  Dorsal Hand No depletion  Patellar Region No depletion  Anterior Thigh Region No depletion  Posterior Calf Region Mild depletion  Edema (RD Assessment) Mild  [RLE mild, [name] moderate]  Hair Reviewed  Eyes Reviewed  Mouth Reviewed  Skin Reviewed  Nails Reviewed       Diet Order:   Diet Order             Diet heart healthy/carb modified Room service appropriate? Yes; Fluid consistency: Thin  Diet effective now                   EDUCATION NEEDS:   Education needs have been addressed  Skin:     Last BM:  10/18, type 4-large  Height:   Ht Readings from Last 1 Encounters:  12/18/22 5\' 4"  (1.626 m)    Weight:   Wt Readings from Last 1 Encounters:  12/18/22 133.5 kg    Ideal Body Weight:  56.8 kg  BMI:  Body mass index is 50.52 kg/m.  Estimated Nutritional Needs:   Kcal:  1450-1700  Protein:  74-85  Fluid:  1700    Alvino Chapel, RDLD  Clinical Dietitian See AMION for contact information

## 2022-12-18 NOTE — Plan of Care (Signed)
  Problem: RH Balance Goal: LTG Patient will maintain dynamic standing with ADLs (OT) Description: LTG:  Patient will maintain dynamic standing balance with assist during activities of daily living (OT)  Flowsheets (Taken 12/18/2022 1633) LTG: Pt will maintain dynamic standing balance during ADLs with: Supervision/Verbal cueing   Problem: RH Bathing Goal: LTG Patient will bathe all body parts with assist levels (OT) Description: LTG: Patient will bathe all body parts with assist levels (OT) Flowsheets (Taken 12/18/2022 1633) LTG: Pt will perform bathing with assistance level/cueing: Supervision/Verbal cueing LTG: Position pt will perform bathing:  At sink  Shower   Problem: RH Dressing Goal: LTG Patient will perform upper body dressing (OT) Description: LTG Patient will perform upper body dressing with assist, with/without cues (OT). Flowsheets (Taken 12/18/2022 1633) LTG: Pt will perform upper body dressing with assistance level of: Supervision/Verbal cueing Goal: LTG Patient will perform lower body dressing w/assist (OT) Description: LTG: Patient will perform lower body dressing with assist, with/without cues in positioning using equipment (OT) Flowsheets (Taken 12/18/2022 1633) LTG: Pt will perform lower body dressing with assistance level of: Supervision/Verbal cueing   Problem: RH Toileting Goal: LTG Patient will perform toileting task (3/3 steps) with assistance level (OT) Description: LTG: Patient will perform toileting task (3/3 steps) with assistance level (OT)  Flowsheets (Taken 12/18/2022 1633) LTG: Pt will perform toileting task (3/3 steps) with assistance level: Supervision/Verbal cueing   Problem: RH Toilet Transfers Goal: LTG Patient will perform toilet transfers w/assist (OT) Description: LTG: Patient will perform toilet transfers with assist, with/without cues using equipment (OT) Flowsheets (Taken 12/18/2022 1633) LTG: Pt will perform toilet transfers with  assistance level of: Supervision/Verbal cueing   Problem: RH Tub/Shower Transfers Goal: LTG Patient will perform tub/shower transfers w/assist (OT) Description: LTG: Patient will perform tub/shower transfers with assist, with/without cues using equipment (OT) Flowsheets (Taken 12/18/2022 1633) LTG: Pt will perform tub/shower stall transfers with assistance level of: Supervision/Verbal cueing

## 2022-12-18 NOTE — Evaluation (Signed)
Speech Language Pathology Assessment and Plan  Patient Details  Name: [name] MRN: [id number] Date of Birth: December 28, 1965  SLP Diagnosis: Cognitive Impairments  Rehab Potential: Good ELOS: 10- 14 days    Today's Date: 12/18/2022 SLP Individual Time: 0905-1005 SLP Individual Time Calculation (min): 60 min   Hospital Problem: Principal Problem:   Debility  Past Medical History:  Past Medical History:  Diagnosis Date   ADD (attention deficit disorder)    Anxiety    Arthritis    both knees right worse than left   Carpal tunnel syndrome of right wrist    Depression    Essential hypertension, benign    Gallstones    History of smoking 06/22/2016   Hyperlipidemia associated with type 2 diabetes mellitus (HCC), on Zocor 04/04/2011   Hypertension associated with diabetes (HCC) 06/03/2008   Migraines    Morbid obesity (HCC)    Morbid obesity with BMI of 50.0-59.9, adult (HCC) 07/12/2006   NICM (nonischemic cardiomyopathy) (HCC) 07/12/2006   01/16/18 ECHO:    - Procedure narrative: Transthoracic echocardiography. Image   quality was suboptimal. The study was technically difficult.   Intravenous contrast (Definity) was administered. - Left ventricle: The cavity size was moderately dilated. Wall   thickness was increased in a pattern of mild LVH. Systolic   function was moderately to severely reduced. The estimated   ejection fraction w   Normal coronary arteries 04/17/2016   OSA on CPAP 10/28/2014   Spinal stenosis    back pain   Spondylosis without myelopathy or radiculopathy, lumbar region 12/04/2017   Type 2 diabetes mellitus with hyperglycemia [facility])    Past Surgical History:  Past Surgical History:  Procedure Laterality Date   CHOLECYSTECTOMY     DILATATION & CURETTAGE/HYSTEROSCOPY WITH MYOSURE N/A 10/31/2017   Procedure: DILATATION & CURETTAGE/HYSTEROSCOPY WITH MYOSURE;  Surgeon: Romualdo Bolk, MD;  Location: [facility];  Service: Gynecology;  Laterality: N/A;   HYSTEROSCOPY WITH  D & C N/A 05/07/2022   Procedure: DILATATION AND CURETTAGE /HYSTEROSCOPY;  Surgeon: [name], MD;  Location: [facility] OR;  Service: Gynecology;  Laterality: N/A;   RIGHT/LEFT HEART CATH AND CORONARY ANGIOGRAPHY N/A 04/11/2016   Procedure: Right/Left Heart Cath and Coronary Angiography;  Surgeon: Kathleene Hazel, MD;  Location: [facility] INVASIVE CV LAB;  Service: Cardiovascular;  Laterality: N/A;   RIGHT/LEFT HEART CATH AND CORONARY ANGIOGRAPHY N/A 12/09/2022   Procedure: RIGHT/LEFT HEART CATH AND CORONARY ANGIOGRAPHY;  Surgeon: Yates Decamp, MD;  Location: [facility] INVASIVE CV LAB;  Service: Cardiovascular;  Laterality: N/A;   sonogram for blood clots     no blockages    Assessment / Plan / Recommendation Clinical Impression  [name] is a 57 year old female with history of HTN, NICM w/chronic HFrEF, morbid obesity-_BMI 50, OSA, T2DM, chronic LBP and right knee pain, depression/anxiety who was admitted on 12/05/22 after witnessed VF cardiac arrest with bystander CPR, prolonged ACLS protocol X 40 minutes, started on IV amiodarone, was unresponsive and intubated on arrival to ED.   She tolerated extubation  without difficulty 10/12 and repeat Echo showed EF 30-35% with trivial MR. CT head negative. EAcute respiratory failure felt to be due to aspiration PNA and was treated with IV ceftriaxone. She received intraosseous IVF management and was found to have developed skin necrosis left leg with swelling and concerns of compartment syndrome. Dr. Charlann Boxer was consulted and did not feel like she had indications of compartment syndrome.  WOC consulted multiple blisters unroofed with orders for local care  with Xeroform gauze and daily dressing changes. Dr, Girard Cooter consulted and felt that cardiac arrest with unclear cause in setting of long standing CM and ICD recommended.  Cardiac MRI and cardiac cath negative for CAD and recommendations for ICD once leg wound resolves.  She has had left upper chest pain felt to be MS  in nature and warm compresses added. Do be started on ritalin for ADHD. Pt was admitted to CIR on 12/17/22.  COGNISTAT administered and revealed strengths in constructional ability, attention, memory, and executive function. She demonstrated mild impairments in orientation; due to inability to recall her age and the current date. Pt also had mild impairments in calculations. Pt acted as her own historian and reports cognitive deficits at baseline 2/2 ADD (Pt was started on Ritalin during this hospitalization). She reported her husband completed her medication and financial management. She also reports consistent use of her phone as an external aid with frequent use of notes and calendar apps. Informal assessment revealed deficits in sustained and selective attention requiring redirection cues throughout session. Pt also demonstrated impaired recall with inability to recall events leaving up to cardiac arrest.   Swallowing screen completed with pt safely consuming regular solids and thin liquids with appeared adequate bolus formation, transit and deglutition and without s/sx pen/asp.  Pt would benefit from skilled SLP services to maximize cognition in order to maximize her independence prior to discharge. Anticipate pt will not need supervision at home or f/u home health SLP services.      Skilled Therapeutic Interventions          BSE, informal assessment measures, and COGNISTAT administered. Please see full report for additional details.     SLP Assessment  Patient will need skilled Speech Lanaguage Pathology Services during CIR admission    Recommendations  SLP Diet Recommendations: Age appropriate regular solids;Thin Liquid Administration via: Straw;Cup Medication Administration: Whole meds with liquid Supervision: Patient able to self feed Postural Changes and/or Swallow Maneuvers: Seated upright 90 degrees;Upright 30-60 min after meal Oral Care Recommendations: Oral care BID Follow up  Recommendations: None Equipment Recommended: None recommended by SLP    SLP Frequency 3 to 5 out of 7 days   SLP Duration  SLP Intensity  SLP Treatment/Interventions 10- 14 days  Minumum of 1-2 x/day, 30 to 90 minutes  Cognitive remediation/compensation;Internal/external aids;Speech/Language facilitation;Cueing hierarchy;Environmental controls;Functional tasks;Patient/family education;Therapeutic Activities    Pain Pain Assessment Pain Scale: 0-10 Pain Score: 8  Pain Type: Acute pain;Surgical pain Pain Location: Chest Pain Orientation: Anterior Pain Descriptors / Indicators: Constant;Radiating Pain Onset: On-going Pain Intervention(s): Distraction Multiple Pain Sites: Yes 2nd Pain Site Pain Score: 8 Pain Type: Acute pain Pain Location: Leg Pain Orientation: Left Pain Descriptors / Indicators: Aching;Burning  Prior Functioning Cognitive/Linguistic Baseline: Baseline deficits Type of Home: House  Lives With: Spouse;Son Available Help at Discharge: Family;Available 24 hours/day Vocation: On disability  SLP Evaluation Cognition Overall Cognitive Status: History of cognitive impairments - at baseline Arousal/Alertness: Awake/alert Orientation Level: Oriented X4 Year: 2024 Month: October Day of Week: Correct Attention: Sustained;Selective Sustained Attention: Impaired Sustained Attention Impairment: Verbal basic Memory: Impaired Memory Impairment: Decreased recall of new information Awareness: Impaired Awareness Impairment: Intellectual impairment Problem Solving: Impaired Problem Solving Impairment: Verbal basic;Functional basic Executive Function: Reasoning Reasoning: Impaired Reasoning Impairment: Verbal basic;Functional basic Safety/Judgment: Appears intact  Comprehension Auditory Comprehension Overall Auditory Comprehension: Appears within functional limits for tasks assessed Visual Recognition/Discrimination Discrimination: Not tested Reading  Comprehension Reading Status: Not tested Expression Expression Primary Mode of Expression: Verbal  Verbal Expression Overall Verbal Expression: Appears within functional limits for tasks assessed Initiation: No impairment Naming: No impairment Pragmatics: No impairment Interfering Components: Attention;Premorbid deficit Written Expression Dominant Hand: Right Written Expression: Not tested Oral Motor Oral Motor/Sensory Function Overall Oral Motor/Sensory Function: Within functional limits Motor Speech Overall Motor Speech: Appears within functional limits for tasks assessed Respiration: Within functional limits Phonation: Normal Resonance: Within functional limits Articulation: Within functional limitis Intelligibility: Intelligible Motor Planning: Witnin functional limits  Care Tool Care Tool Cognition Ability to hear (with hearing aid or hearing appliances if normally used Ability to hear (with hearing aid or hearing appliances if normally used): 0. Adequate - no difficulty in normal conservation, social interaction, listening to TV   Expression of Ideas and Wants Expression of Ideas and Wants: 3. Some difficulty - exhibits some difficulty with expressing needs and ideas (e.g, some words or finishing thoughts) or speech is not clear   Understanding Verbal and Non-Verbal Content Understanding Verbal and Non-Verbal Content: 4. Understands (complex and basic) - clear comprehension without cues or repetitions  Memory/Recall Ability Memory/Recall Ability : Current season;That he or she is in a hospital/hospital unit    Bedside Swallowing Assessment General Temperature Spikes Noted: No Respiratory Status: Room air History of Recent Intubation: Yes Behavior/Cognition: Alert;Cooperative Oral Cavity - Dentition: Adequate natural dentition Self-Feeding Abilities: Able to feed self Patient Positioning: Upright in bed Baseline Vocal Quality: Normal  Oral Care Assessment Lips:  Symmetrical Tongue: Pink;Moist Mucous Membrane(s): Moist;Pink Saliva: Moist, saliva free flowing Level of Consciousness: Alert Is patient on any of following O2 devices?: None of the above Nutritional status: No high risk factors Oral Assessment Risk : Low Risk Ice Chips Ice chips: Not tested Thin Liquid Thin Liquid: Within functional limits Presentation: Straw;Self Fed Nectar Thick Nectar Thick Liquid: Not tested Honey Thick Honey Thick Liquid: Not tested Puree Puree: Not tested Solid Solid: Within functional limits Presentation: Self Fed BSE Assessment Risk for Aspiration Impact on safety and function: No limitations  Short Term Goals: Week 1: SLP Short Term Goal 1 (Week 1): Patient will complete moderately complex problem solving with 90% acc with sup A SLP Short Term Goal 2 (Week 1): Patient will recall novel information given a 10- 15 minute delay with 90% acc given sup A SLP Short Term Goal 3 (Week 1): Patient will demonstrate sustained attention during functional tasks for >10 minutes given min multimodal A  Refer to Care Plan for Long Term Goals  Recommendations for other services: None   Discharge Criteria: Patient will be discharged from SLP if patient refuses treatment 3 consecutive times without medical reason, if treatment goals not met, if there is a change in medical status, if patient makes no progress towards goals or if patient is discharged from hospital.  The above assessment, treatment plan, treatment alternatives and goals were discussed and mutually agreed upon: by patient  Renaee Munda 12/18/2022, 11:37 AM

## 2022-12-18 NOTE — Progress Notes (Signed)
Physical Therapy Assessment and Plan  Patient Details  Name: [name] MRN: [id number] Date of Birth: 15-Feb-1966  PT Diagnosis: Abnormal posture, Abnormality of gait, Difficulty walking, Edema, Impaired cognition, Impaired sensation, Low back pain, and Muscle weakness Rehab Potential: Good ELOS: 10-12 days   Today's Date: 12/18/2022 PT Individual Time: 1105-1205 PT Individual Time Calculation (min): 60 min    Hospital Problem: Principal Problem:   Debility   Past Medical History:  Past Medical History:  Diagnosis Date   ADD (attention deficit disorder)    Anxiety    Arthritis    both knees right worse than left   Carpal tunnel syndrome of right wrist    Depression    Essential hypertension, benign    Gallstones    History of smoking 06/22/2016   Hyperlipidemia associated with type 2 diabetes mellitus (HCC), on Zocor 04/04/2011   Hypertension associated with diabetes (HCC) 06/03/2008   Migraines    Morbid obesity (HCC)    Morbid obesity with BMI of 50.0-59.9, adult (HCC) 07/12/2006   NICM (nonischemic cardiomyopathy) (HCC) 07/12/2006   01/16/18 ECHO:    - Procedure narrative: Transthoracic echocardiography. Image   quality was suboptimal. The study was technically difficult.   Intravenous contrast (Definity) was administered. - Left ventricle: The cavity size was moderately dilated. Wall   thickness was increased in a pattern of mild LVH. Systolic   function was moderately to severely reduced. The estimated   ejection fraction w   Normal coronary arteries 04/17/2016   OSA on CPAP 10/28/2014   Spinal stenosis    back pain   Spondylosis without myelopathy or radiculopathy, lumbar region 12/04/2017   Type 2 diabetes mellitus with hyperglycemia [facility])    Past Surgical History:  Past Surgical History:  Procedure Laterality Date   CHOLECYSTECTOMY     DILATATION & CURETTAGE/HYSTEROSCOPY WITH MYOSURE N/A 10/31/2017   Procedure: DILATATION & CURETTAGE/HYSTEROSCOPY WITH MYOSURE;  Surgeon:  Romualdo Bolk, MD;  Location: [facility];  Service: Gynecology;  Laterality: N/A;   HYSTEROSCOPY WITH D & C N/A 05/07/2022   Procedure: DILATATION AND CURETTAGE /HYSTEROSCOPY;  Surgeon: [name], MD;  Location: [facility] OR;  Service: Gynecology;  Laterality: N/A;   RIGHT/LEFT HEART CATH AND CORONARY ANGIOGRAPHY N/A 04/11/2016   Procedure: Right/Left Heart Cath and Coronary Angiography;  Surgeon: Kathleene Hazel, MD;  Location: [facility] INVASIVE CV LAB;  Service: Cardiovascular;  Laterality: N/A;   RIGHT/LEFT HEART CATH AND CORONARY ANGIOGRAPHY N/A 12/09/2022   Procedure: RIGHT/LEFT HEART CATH AND CORONARY ANGIOGRAPHY;  Surgeon: Yates Decamp, MD;  Location: [facility] INVASIVE CV LAB;  Service: Cardiovascular;  Laterality: N/A;   sonogram for blood clots     no blockages    Assessment & Plan Clinical Impression: Patient is a 57 y.o. year old female with PMH of HTN, CHF with EF 30-35% on GDMT, DM, obesity, arthritis, ADD, OSA on CPAP (intermittently compliant), chronic back pain and knee pain admitted to [facility] on 10/10 after a witnessed cardiac arrest with bystander CPR. On EMS arrival she was found to be in VT/VF and ACLS was administered over several arrest periods, totaling up to 40 minutes. Pt received 6 defibrillations, 9 rounds of epi, 2 rounds of amio, and 1 magnesium. Upon arrival to ED she was intubated for airway protection. She did require pressor support. Labs on admission WBC 15, K 3.4, Crt 1.1, lactate 13, pH 7. Echo showed elevated filling pressors and required diuresis. She was extubated on 10/12 and is currently on room air.  She developed a wound on her [name] from prior extravasation of pressors on their initiation. Orthopedics consulted and felt no concern for compartment syndrome and recommended observe for demarcation and possible debridement. Arrest felt to be arrhythmic in nature and EP consulted. Recommendations for cardiac MRI and ICD placement vs lifevest MRI showed normal LV size  with moderate systolic dysfunction, EF 31%, normal RV size, EF 50%. EP felt lifevest more appropriate at this time given LE wound and risk for infection. Plastic surgery consult for [name] and recommending continued wound care with dressing changes, and potential for surgical intervention down the road.   Patient transferred to CIR on 12/17/2022 .   Patient currently requires min with mobility secondary to muscle weakness and muscle joint tightness, decreased cardiorespiratoy endurance, unbalanced muscle activation,  ,  , decreased attention, decreased awareness, decreased problem solving, decreased memory, and delayed processing,  , and decreased standing balance and decreased balance strategies.  Prior to hospitalization, patient was supervision using furniture walking assist with mobility and lived with Spouse, Son in a House home.  Home access is 1Stairs to enter (entrance 1 step w/ BHR not sturdy, full flight in home to 2nd level 15-20 turn then 4-5).  Patient will benefit from skilled PT intervention to maximize safe functional mobility, minimize fall risk, and decrease caregiver burden for planned discharge home with 24 hour supervision.  Anticipate patient will benefit from follow up OP at discharge.  PT - End of Session Activity Tolerance: Tolerates 30+ min activity with multiple rests Endurance Deficit: Yes Endurance Deficit Description: decreased PT Assessment Rehab Potential (ACUTE/IP ONLY): Good PT Barriers to Discharge: Home environment access/layout;Wound Care PT Patient demonstrates impairments in the following area(s): Balance;Behavior;Edema;Endurance;Motor;Pain;Safety;Sensory;Skin Integrity PT Transfers Functional Problem(s): Car;Bed to Chair;Bed Mobility PT Locomotion Functional Problem(s): Ambulation;Stairs;Wheelchair Mobility PT Plan PT Intensity: Minimum of 1-2 x/day ,45 to 90 minutes PT Frequency: 5 out of 7 days PT Duration Estimated Length of Stay: 10-12 days PT  Treatment/Interventions: Ambulation/gait training;Cognitive remediation/compensation;Discharge planning;DME/adaptive equipment instruction;Functional mobility training;Pain management;Psychosocial support;Therapeutic Activities;UE/LE Strength taining/ROM;Balance/vestibular training;Neuromuscular re-education;Stair training;Therapeutic Exercise;Skin care/wound management;Patient/family education;Community reintegration;UE/LE Coordination activities;Disease management/prevention PT Transfers Anticipated Outcome(s): supervision PT Locomotion Anticipated Outcome(s): supervision PT Recommendation Recommendations for Other Services: Neuropsych consult Follow Up Recommendations: 24 hour supervision/assistance;Outpatient PT Patient destination: Home Equipment Recommended: To be determined   PT Evaluation Precautions/Restrictions Precautions Precautions: Fall Precaution Comments: Life Vest, Falls, Chronic low back and R-knee pain Restrictions Weight Bearing Restrictions: No RUE Weight Bearing: Weight bearing as tolerated Pain Pain Assessment Pain Scale: 0-10 Pain Score: 9  Pain Type: Acute pain;Surgical pain Pain Location: Chest Pain Orientation: Anterior Pain Descriptors / Indicators: Constant;Radiating Pain Onset: On-going Pain Intervention(s): Distraction Multiple Pain Sites: Yes 2nd Pain Site Pain Score: 8 Pain Type: Acute pain Pain Location: Leg Pain Orientation: Left Pain Descriptors / Indicators: Aching;Burning Pain Interference Pain Interference Pain Effect on Sleep: 3. Frequently Pain Interference with Therapy Activities: 3. Frequently Pain Interference with Day-to-Day Activities: 3. Frequently Home Living/Prior Functioning Home Living Family/patient expects to be discharged to:: Private residence Living Arrangements: Spouse/significant other Available Help at Discharge: Family;Available 24 hours/day (son in college but lives at home, husband home most of the time does not  work) Type of Home: House Home Access: Stairs to enter (entrance 1 step w/ BHR not sturdy, full flight in home to 2nd level 15-20 turn then 4-5) Entrance Stairs-Number of Steps: 1 Entrance Stairs-Rails: Left;Right Home Layout: Two level;1/2 bath on main level;Bed/bath upstairs Alternate Level Stairs-Rails: Left;Right Bathroom Shower/Tub: Tub/shower unit  Bathroom Toilet: Standard Bathroom Accessibility: No Additional Comments: uses scooter if going to a large store  Lives With: Spouse;Son Prior Function Level of Independence: Needs assistance with ADLs;Needs assistance with homemaking;Needs assistance with gait Bath:  (bathing downstairs in sink most of the time, 2 days weeks goes upstairs to shower)  Able to Take Stairs?: Yes (difficult, only takes stairs 2 times a week) Driving: No Vocation: On disability Vision/Perception  Vision - History Ability to See in Adequate Light: 0 Adequate Perception Perception: Within Functional Limits Praxis Praxis: WFL  Cognition Overall Cognitive Status: Impaired/Different from baseline Arousal/Alertness: Awake/alert Orientation Level: Oriented X4 Year: 2024 Month: October Day of Week: Correct Attention: Sustained Sustained Attention: Impaired Sustained Attention Impairment: Verbal basic Memory: Impaired Memory Impairment: Decreased recall of new information (was not 100% sure what happened day of arrival and what happened. Can not remember if someone has already told her what happened yet.) Awareness: Impaired Awareness Impairment: Intellectual impairment Problem Solving: Appears intact Problem Solving Impairment: Verbal basic;Functional basic Executive Function: Reasoning Reasoning: Impaired Reasoning Impairment: Verbal basic;Functional basic Safety/Judgment: Appears intact Sensation Sensation Light Touch: Impaired Detail Peripheral sensation comments: deficits L > R at feet, difficult to assess lower leg d/t wound wrap Light Touch  Impaired Details: Impaired [name] Hot/Cold: Not tested Proprioception: Not tested Stereognosis: Not tested Coordination Gross Motor Movements are Fluid and Coordinated: No (pain limited) Fine Motor Movements are Fluid and Coordinated: Yes Coordination and Movement Description: Decreased due to generalized weakness/debility & [name] wound Motor  Motor Motor: Other (comment) Motor - Skilled Clinical Observations: weakness d/t deconditioning and pain in multiple sites (back, ribs, chest, [name])  Trunk/Postural Assessment  Cervical Assessment Cervical Assessment: Exceptions to [facility] (Forward head) Thoracic Assessment Thoracic Assessment: Exceptions to [facility] (Rounded shoulders) Lumbar Assessment Lumbar Assessment: Exceptions to [facility] (Posterior pelvic tilt) Postural Control Postural Control: Within Functional Limits  Balance Balance Balance Assessed: Yes Static Sitting Balance Static Sitting - Balance Support: Feet supported Static Sitting - Level of Assistance: 5: Stand by assistance Dynamic Sitting Balance Dynamic Sitting - Balance Support: Feet supported;During functional activity Dynamic Sitting - Level of Assistance: 4: Min assist Dynamic Sitting - Balance Activities: Lateral lean/weight shifting;Forward lean/weight shifting;Reaching for objects Static Standing Balance Static Standing - Balance Support: Bilateral upper extremity supported;During functional activity Static Standing - Level of Assistance: 5: Stand by assistance Dynamic Standing Balance Dynamic Standing - Balance Support: During functional activity;Bilateral upper extremity supported Dynamic Standing - Level of Assistance: Other (comment) (CGA) Dynamic Standing - Balance Activities: Lateral lean/weight shifting;Forward lean/weight shifting;Reaching for objects Dynamic Standing - Comments: during ambulation Extremity Assessment  RLE Assessment RLE Assessment: Within Functional Limits Passive Range of Motion (PROM) Comments:  WFL Active Range of Motion (AROM) Comments: WFL General Strength Comments: grossly 4/5 [name] Assessment [name] Assessment: Exceptions to [facility] Passive Range of Motion (PROM) Comments: WFL Active Range of Motion (AROM) Comments: limited knee flex/ext d/t pain General Strength Comments: grossly 3/5, did not formally test d/t high pain, during ambulation pt demo no buckling  Care Tool Care Tool Bed Mobility Roll left and right activity Roll left and right activity did not occur: Safety/medical concerns      Sit to lying activity Sit to lying activity did not occur: Safety/medical concerns      Lying to sitting on side of bed activity Lying to sitting on side of bed activity did not occur: the ability to move from lying on the back to sitting on the side of the bed with no back support.:  Safety/medical concerns       Care Tool Transfers Sit to stand transfer   Sit to stand assist level: Contact Guard/Touching assist    Chair/bed transfer   Chair/bed transfer assist level: Contact Guard/Touching assist    Car transfer Car transfer activity did not occur: Safety/medical concerns        Care Tool Locomotion Ambulation   Assist level: Contact Guard/Touching assist Assistive device: Rollator Max distance: 80  Walk 10 feet activity   Assist level: Contact Guard/Touching assist Assistive device: Rollator   Walk 50 feet with 2 turns activity   Assist level: Contact Guard/Touching assist Assistive device: Rollator  Walk 150 feet activity Walk 150 feet activity did not occur: Safety/medical concerns      Walk 10 feet on uneven surfaces activity Walk 10 feet on uneven surfaces activity did not occur: Safety/medical concerns      Stairs   Assist level: Contact Guard/Touching assist Stairs assistive device: 2 hand rails Max number of stairs: 4  Walk up/down 1 step activity   Walk up/down 1 step (curb) assist level: Contact Guard/Touching assist Walk up/down 1 step or curb assistive  device: 2 hand rails  Walk up/down 4 steps activity   Walk up/down 4 steps assist level: Contact Guard/Touching assist Walk up/down 4 steps assistive device: 2 hand rails  Walk up/down 12 steps activity Walk up/down 12 steps activity did not occur: Safety/medical concerns      Pick up small objects from floor   Pick up small object from the floor assist level: Dependent - Patient 0%    Wheelchair Is the patient using a wheelchair?: Yes Type of Wheelchair: Manual   Wheelchair assist level: Dependent - Patient 0%    Wheel 50 feet with 2 turns activity   Assist Level: Dependent - Patient 0%  Wheel 150 feet activity   Assist Level: Dependent - Patient 0%    Refer to Care Plan for Long Term Goals  SHORT TERM GOAL WEEK 1 PT Short Term Goal 1 (Week 1): pt will ambulate 150 ft CGA PT Short Term Goal 2 (Week 1): pt will perform STS w/ supervision PT Short Term Goal 3 (Week 1): pt will perform bed mobility w/ minA PT Short Term Goal 4 (Week 1): pt will ambulate 8 stairs CGA PT Short Term Goal 5 (Week 1): pt will perform bed to chair transfer supervision  Recommendations for other services: Neuropsych  Skilled Therapeutic Intervention Evaluation completed (see details above) with patient education regarding purpose of PT evaluation, PT POC and goals, therapy schedule, weekly team meetings, and other CIR information including safety plan and fall risk safety. Pt performed the below functional mobility tasks with the specified levels of skilled cuing and assistance.  Erionna currently is CGA for fuctional mobility such as STS, bed to chair transfers, ambulating up to 90 ft w/ rollator, and navigating 4 steps w/ a step to pattern and BHR, 1st 6inch step up/down. Pt is limited by her chronic LBP, acute chest pain/rib pain, as well as acute [name] pain d/t open wound. Pt is hyperverbose and reports having hx of anxiety and depression.  Mobility Transfers Transfers: Sit to Stand;Stand to Sit;Stand  Pivot Transfers Sit to Stand: Contact Guard/Touching assist Stand to Sit: Contact Guard/Touching assist Stand Pivot Transfers: Contact Guard/Touching assist Stand Pivot Transfer Details: Verbal cues for sequencing;Verbal cues for technique;Verbal cues for precautions/safety;Verbal cues for gait pattern;Verbal cues for safe use of DME/AE Transfer (Assistive device): Rollator Locomotion  Gait Ambulation: Yes  Gait Assistance: Contact Guard/Touching assist Assistive device: Rollator Gait Assistance Details: Verbal cues for sequencing;Verbal cues for technique;Verbal cues for precautions/safety;Verbal cues for gait pattern;Verbal cues for safe use of DME/AE;Visual cues/gestures for precautions/safety;Visual cues/gestures for sequencing;Visual cues for safe use of DME/AE Gait Gait: Yes Gait Pattern: Impaired Gait Pattern: Step-through pattern;Decreased step length - right;Decreased step length - left;Poor foot clearance - left;Poor foot clearance - right;Decreased stance time - left;Decreased stance time - right;Decreased stride length;Decreased hip/knee flexion - right;Decreased hip/knee flexion - left Gait velocity: decr Stairs / Additional Locomotion Stairs: Yes Stairs Assistance: Contact Guard/Touching assist Stair Management Technique: Two rails;Step to pattern;Backwards;Forwards (1st 6 inch step up/down x4) Number of Stairs: 4 Height of Stairs: 6 Curb: Contact Guard/Touching assist Wheelchair Mobility Wheelchair Mobility: Yes Wheelchair Assistance: Dependent - Patient 0% Wheelchair Parts Management: Needs assistance   Pt left in WC, alarm activated, nursing present, all needs met.  Discharge Criteria: Patient will be discharged from PT if patient refuses treatment 3 consecutive times without medical reason, if treatment goals not met, if there is a change in medical status, if patient makes no progress towards goals or if patient is discharged from hospital.  The above assessment,  treatment plan, treatment alternatives and goals were discussed and mutually agreed upon: by patient  Gilman Buttner 12/18/2022, 12:42 PM

## 2022-12-19 ENCOUNTER — Ambulatory Visit (HOSPITAL_BASED_OUTPATIENT_CLINIC_OR_DEPARTMENT_OTHER): Payer: Medicare Other | Admitting: Physical Therapy

## 2022-12-19 DIAGNOSIS — I469 Cardiac arrest, cause unspecified: Secondary | ICD-10-CM | POA: Diagnosis not present

## 2022-12-19 DIAGNOSIS — R5381 Other malaise: Secondary | ICD-10-CM | POA: Diagnosis not present

## 2022-12-19 DIAGNOSIS — R0789 Other chest pain: Secondary | ICD-10-CM | POA: Diagnosis not present

## 2022-12-19 DIAGNOSIS — I5022 Chronic systolic (congestive) heart failure: Secondary | ICD-10-CM | POA: Diagnosis not present

## 2022-12-19 DIAGNOSIS — E119 Type 2 diabetes mellitus without complications: Secondary | ICD-10-CM

## 2022-12-19 DIAGNOSIS — N189 Chronic kidney disease, unspecified: Secondary | ICD-10-CM | POA: Diagnosis present

## 2022-12-19 DIAGNOSIS — E1169 Type 2 diabetes mellitus with other specified complication: Secondary | ICD-10-CM

## 2022-12-19 DIAGNOSIS — S81802D Unspecified open wound, left lower leg, subsequent encounter: Secondary | ICD-10-CM | POA: Diagnosis not present

## 2022-12-19 LAB — GLUCOSE, CAPILLARY
Glucose-Capillary: 119 mg/dL — ABNORMAL HIGH (ref 70–99)
Glucose-Capillary: 116 mg/dL — ABNORMAL HIGH (ref 70–99)
Glucose-Capillary: 94 mg/dL (ref 70–99)
Glucose-Capillary: 116 mg/dL — ABNORMAL HIGH (ref 70–99)
Glucose-Capillary: 127 mg/dL — ABNORMAL HIGH (ref 70–99)

## 2022-12-19 MED ORDER — GABAPENTIN 100 MG PO CAPS
200.0000 mg | ORAL_CAPSULE | Freq: Three times a day (TID) | ORAL | Status: DC
  Administered 2022-12-19 – 2022-12-20 (×3): 200 mg via ORAL
  Filled 2022-12-19 (×3): qty 2

## 2022-12-19 MED ORDER — LIDOCAINE 5 % EX PTCH
2.0000 | MEDICATED_PATCH | Freq: Every day | CUTANEOUS | Status: DC
  Administered 2022-12-19 – 2022-12-30 (×10): 2 via TRANSDERMAL
  Filled 2022-12-19 (×13): qty 2

## 2022-12-19 NOTE — Progress Notes (Signed)
Progress Note  Patient Name: [name] Date of Encounter: 12/19/2022  Primary Cardiologist:   [name], MD   Subjective   BP 107/64 Reports chest pain with inspiration.  Wearing lifevest  Inpatient Medications    Scheduled Meds:  ascorbic acid  500 mg Oral Daily   atorvastatin  80 mg Oral Daily   clotrimazole  1 Applicatorful Vaginal QHS   dapagliflozin propanediol  10 mg Oral Daily   diclofenac Sodium  2 g Topical QID   enoxaparin (LOVENOX) injection  40 mg Subcutaneous Q24H   furosemide  40 mg Oral Daily   gabapentin  200 mg Oral TID   insulin aspart  0-5 Units Subcutaneous QHS   insulin aspart  0-9 Units Subcutaneous TID WC   lidocaine  2 patch Transdermal Q24H   losartan  50 mg Oral Daily   metFORMIN  250 mg Oral BID WC   methylphenidate  5 mg Oral BID WC   metoprolol succinate  50 mg Oral Daily   nutrition supplement (JUVEN)  1 packet Oral BID BM   protein supplement  1 Scoop Oral TID WC   silver sulfADIAZINE   Topical BID   spironolactone  12.5 mg Oral Daily   Continuous Infusions:   PRN Meds: acetaminophen, ALPRAZolam, alum & mag hydroxide-simeth, bisacodyl, diphenhydrAMINE, guaiFENesin-dextromethorphan, hydrOXYzine, mouth rinse, oxyCODONE, polyethylene glycol, prochlorperazine **OR** prochlorperazine **OR** prochlorperazine, sodium phosphate, traZODone   Vital Signs    Vitals:   12/18/22 1420 12/18/22 1929 12/19/22 0351 12/19/22 0353  BP: 102/87 108/71 107/64   Pulse: (!) 102 91 81   Resp: 19 18    Temp: 97.9 F (36.6 C) 98.4 F (36.9 C)    TempSrc: Oral Oral    SpO2: 99% 99% 95%   Weight:    (!) 151 kg  Height:        Intake/Output Summary (Last 24 hours) at 12/19/2022 1047 Last data filed at 12/19/2022 0855 Gross per 24 hour  Intake 720 ml  Output --  Net 720 ml   Filed Weights   12/18/22 0900 12/19/22 0353  Weight: 133.5 kg (!) 151 kg    Telemetry  Not on telemetry  Personally Reviewed  ECG    NA - Personally  Reviewed  Physical Exam   GEN:     Anxious no acute distress.   Neck: No  JVD Cardiac: RRR, no murmurs, rubs, or gallops.  Respiratory: Clear   to auscultation bilaterally. GI: Soft, nontender, non-distended, normal bowel sounds  MS:  No edema; bandage over left leg Neuro:   Nonfocal  Psych: Oriented and appropriate    Labs    Chemistry Recent Labs  Lab 12/16/22 0233 12/17/22 0420 12/18/22 0500  NA 138 137 136  K 4.6 3.8 3.5  CL 101 99 99  CO2 23 28 30   GLUCOSE 115* 119* 125*  BUN 18 17 18   CREATININE 1.24* 1.01* 1.15*  CALCIUM 9.0 8.8* 8.8*  PROT  --   --  6.1*  ALBUMIN  --   --  2.8*  AST  --   --  23  ALT  --   --  26  ALKPHOS  --   --  42  BILITOT  --   --  0.9  GFRNONAA 51* >60 56*  ANIONGAP 14 10 7      Hematology Recent Labs  Lab 12/13/22 0500 12/18/22 0500  WBC 8.1 10.0  RBC 4.32 4.58  HGB 12.4 12.9  HCT 37.8 40.0  MCV  87.5 87.3  MCH 28.7 28.2  MCHC 32.8 32.3  RDW 14.9 14.8  PLT 220 283    Cardiac EnzymesNo results for input(s): "TROPONINI" in the last 168 hours. No results for input(s): "TROPIPOC" in the last 168 hours.   BNPNo results for input(s): "BNP", "PROBNP" in the last 168 hours.   DDimer No results for input(s): "DDIMER" in the last 168 hours.   Radiology    No results found.  Cardiac Studies   Cath above:  NL coronaries.   Right heart pressures OK.    Patient Profile     57 y.o. 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  Echocardiogram 12/07/2022 showed EF 30 to 35%, normal RV function.  Cath 12/09/2022 showed normal coronary arteries, RA 8, RV 47/6, PA 46/21/31, PCWP 19, CI 2.8.  Cardiac MRI 12/12/2022 showed LVEF 31%, RVEF 50%, RV insertion site LGE. -Continue Lasix 40 mg daily -Continue losartan 50 mg daily,  Toprol-XL 50 mg daily, spironolactone 12.5 mg daily, Farxiga 10 mg daily.  Previously was on entresto and coreg but reports would not take her evening doses of any medications; regimen simplified to once daily dosing -Check BMET tomorrow  VF arrest: EP consulted, planning ICD once leg wound heals.  Recommend LifeVest in the meantime.  Shehas been wearing lifevest  Hyperlipidemia: On atorvastatin 80 mg daily  T2DM: On SSI, Farxiga  Leg wound: plastic surgery consulted, may need surgery   For questions or updates, please contact [facility] Please consult [email or web address] for contact info under Cardiology/STEMI.   Signed, Little Ishikawa, MD  12/19/2022, 10:47 AM

## 2022-12-19 NOTE — Progress Notes (Signed)
Occupational Therapy Session Note  Patient Details  Name: [name] MRN: [id number] Date of Birth: [birth year]  Today's Date: 12/19/2022 OT Individual Time: 6433-2951 OT Individual Time Calculation (min): 74 min    Short Term Goals: Week 1:  OT Short Term Goal 1 (Week 1): STGs=LTGs due to patient's length of stay.  Skilled Therapeutic Interventions/Progress Updates:   Pt seen for skilled OT session with hand off from PT for care coordination. Pt requesting sink side bathing, grooming and then toileting in bathroom with use of rollator and then seated rests intermittently. OT provided reminders for Life vest transponder, sequencing steps of routine with divided attention components. Pt requires single clear steps to assist with focus. Pt with overt thinking even with basic oral care, face washing etc. LH sponge provided for LB sponge bathing and reacher demo for slip on shoes management. Amb with rollator with CGA for toileting with ability to perform seated peri hygiene. Issued foam cube grasp trainer for grasp, pinch, press 10 reps x 3 sets. Provided listening support as pt reports s/s of anxiety and frustration with medical condition and functional level.  Left pt with LE's elevated, needs, and nurse call button and chair alarm set.   Pain: multiple pain sites L LE, back, chest and buttocks with relief with pain meds, patch, ROHO cushion and LE's elevated as well as distraction and foam cube squeezes   Therapy Documentation Precautions:  Precautions Precautions: Fall Precaution Comments: Life Vest, Falls, Chronic low back and R-knee pain Restrictions Weight Bearing Restrictions: No RUE Weight Bearing: Weight bearing as tolerated   Therapy/Group: Individual Therapy  [name] 12/19/2022, 7:54 AM

## 2022-12-19 NOTE — Progress Notes (Signed)
Speech Language Pathology Daily Session Note  Patient Details  Name: [name] MRN: [id number] Date of Birth: [birth year]  Today's Date: 12/19/2022 SLP Individual Time: 4098-1191 SLP Individual Time Calculation (min): 60 min  Short Term Goals: Week 1: SLP Short Term Goal 1 (Week 1): Patient will complete moderately complex problem solving with 90% acc with sup A SLP Short Term Goal 2 (Week 1): Patient will recall novel information given a 10- 15 minute delay with 90% acc given sup A SLP Short Term Goal 3 (Week 1): Patient will demonstrate sustained attention during functional tasks for >10 minutes given min multimodal A  Skilled Therapeutic Interventions: Skilled therapy session focused on cognitive goals. SLP facilitated session by reviewing SLP's role and results of cognitive evaluation completed prior date. Patient hesitant to participate in speech therapy and tearful regarding medical status. SLP provided encouragement as able and discussed ST goals; patient agreeable to participate this date. SLP educated regarding memory strategies and provided handout.  SLP continued to provide strategies regarding organization and use of technology to aid in memory. Patient left in [facility] with husband present. Continue POC.   Pain Patient reports pain in chest and surrounding areas. MD/RN aware.   Therapy/Group: Individual Therapy  Hildagard Sobecki M.A., CF-SLP 12/19/2022, 9:47 AM

## 2022-12-19 NOTE — Progress Notes (Signed)
Physical Therapy Session Note  Patient Details  Name: [name] MRN: [id number] Date of Birth: [birth year]  Today's Date: 12/19/2022 PT Individual Time: 1400-1415 PT Individual Time Calculation (min): 15 min   Short Term Goals: Week 1:  PT Short Term Goal 1 (Week 1): pt will ambulate 150 ft CGA PT Short Term Goal 2 (Week 1): pt will perform STS w/ supervision PT Short Term Goal 3 (Week 1): pt will perform bed mobility w/ minA PT Short Term Goal 4 (Week 1): pt will ambulate 8 stairs CGA PT Short Term Goal 5 (Week 1): pt will perform bed to chair transfer supervision  Skilled Therapeutic Interventions/Progress Updates: Pt presented in recliner with NT taking vitals. Pt appeared very overwhelmed and near distress. Pt c/o pain B ribs, LB, BLE, premedicated. Pt then spent a SIGNIFICANT amount of time expressing fears/concerns/increased anxiety. PTA provided active listening and support. Discussed pt's current LOF and what her expectations are. Pt also expressed that buttock/LB sore and noted that pt had been trying to work on repositioning/pressure relief throughout discussion. PTA noted that pt did not have a w/c cushion or any additional padding for pressure relief. PTA obtained w/c cushion and pt stood with supervision and RW and ambulated around room for pressure relief. Pt returned to recliner and handed off to OT for next session.      Therapy Documentation Precautions:  Precautions Precautions: Fall Precaution Comments: Life Vest, Falls, Chronic low back and R-knee pain Restrictions Weight Bearing Restrictions: No RUE Weight Bearing: Weight bearing as tolerated     Therapy/Group: Individual Therapy  Lenisha Lacap 12/19/2022, 3:27 PM

## 2022-12-19 NOTE — Progress Notes (Signed)
   12/18/22 2156  BiPAP/CPAP/SIPAP  Reason BIPAP/CPAP not in use Non-compliant

## 2022-12-19 NOTE — Progress Notes (Addendum)
PROGRESS NOTE   Subjective/Complaints: Patient reports she is continue to have soreness and pain in her left leg, shoulder and his back and chest wall.  ROS: Patient denies fever, chills, headache, abdominal pain, diarrhea, motor or sensory changes, rash   objective:   No results found. Recent Labs    12/18/22 0500  WBC 10.0  HGB 12.9  HCT 40.0  PLT 283   Recent Labs    12/17/22 0420 12/18/22 0500  NA 137 136  K 3.8 3.5  CL 99 99  CO2 28 30  GLUCOSE 119* 125*  BUN 17 18  CREATININE 1.01* 1.15*  CALCIUM 8.8* 8.8*    Intake/Output Summary (Last 24 hours) at 12/19/2022 1644 Last data filed at 12/19/2022 1259 Gross per 24 hour  Intake 360 ml  Output --  Net 360 ml        Physical Exam: Vital Signs Blood pressure 104/76, pulse 98, temperature 98.9 F (37.2 C), resp. rate 18, height 5\' 4"  (1.626 m), weight (!) 151 kg, SpO2 98%.    General:  No apparent distress, sitting in on edge of bed, obese HEENT: NCAT, MMM  Heart: Reg rate and rhythm. No murmurs rubs or gallops, wearing life vest  Chest: CTAB abdomen: Soft, non-tender, non-distended, bowel sounds positive. Extremities: large [name] wound covered in dressings with areas of serosanguineous drainage noted Psych: Cooperative, a little flat Skin: Clean and intact without signs of breakdown Neuro:  Alert and oriented x3, follows commands, cranial nerves II through XII intact, no speech or language deficits noted, + memory deficits Sensation intact light touch in all 4 extremities Poor safety awareness.  Gets up without calling nursing to go to the bathroom  Strength 4+ out of 5 in bilateral upper extremities Strength 4+ out of 5 right lower extremity Strength 2-3 out of 5 left lower extremity Musculoskeletal: No joint swelling noted  Assessment/Plan: 1. Functional deficits which require 3+ hours per day of interdisciplinary therapy in a comprehensive  inpatient rehab setting. Physiatrist is providing close team supervision and 24 hour management of active medical problems listed below. Physiatrist and rehab team continue to assess barriers to discharge/monitor patient progress toward functional and medical goals  Care Tool:  Bathing    Body parts bathed by patient: Right arm, Left arm, Chest, Abdomen, Front perineal area, Face   Body parts bathed by helper: Buttocks, Right lower leg, Right upper leg, Left upper leg Body parts n/a: Left lower leg   Bathing assist Assist Level: Moderate Assistance - Patient 50 - 74%     Upper Body Dressing/Undressing Upper body dressing   What is the patient wearing?: Hospital gown only    Upper body assist Assist Level: Minimal Assistance - Patient > 75%    Lower Body Dressing/Undressing Lower body dressing      What is the patient wearing?: Hospital gown only     Lower body assist Assist for lower body dressing: Minimal Assistance - Patient > 75%     Toileting Toileting    Toileting assist Assist for toileting: Moderate Assistance - Patient 50 - 74%     Transfers Chair/bed transfer  Transfers assist     Chair/bed  transfer assist level: Contact Guard/Touching assist     Locomotion Ambulation   Ambulation assist      Assist level: Contact Guard/Touching assist Assistive device: Rollator Max distance: 80   Walk 10 feet activity   Assist     Assist level: Contact Guard/Touching assist Assistive device: Rollator   Walk 50 feet activity   Assist    Assist level: Contact Guard/Touching assist Assistive device: Rollator    Walk 150 feet activity   Assist Walk 150 feet activity did not occur: Safety/medical concerns         Walk 10 feet on uneven surface  activity   Assist Walk 10 feet on uneven surfaces activity did not occur: Safety/medical concerns         Wheelchair     Assist Is the patient using a wheelchair?: Yes Type of  Wheelchair: Manual    Wheelchair assist level: Dependent - Patient 0%      Wheelchair 50 feet with 2 turns activity    Assist        Assist Level: Dependent - Patient 0%   Wheelchair 150 feet activity     Assist      Assist Level: Dependent - Patient 0%   Blood pressure 104/76, pulse 98, temperature 98.9 F (37.2 C), resp. rate 18, height 5\' 4"  (1.626 m), weight (!) 151 kg, SpO2 98%.  Medical Problem List and Plan: 1. 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 Continue life vest              -patient may shower             -ELOS/Goals: 10-14 day,  PT/OT supervision to mod I, SLP mod I              -Continue CIR  -Discussed safety with pt, call nurses to get up. 2.  Antithrombotics: -DVT/anticoagulation:  Pharmaceutical: Lovenox             -antiplatelet therapy: N/A 3. Pain Management:  Oxycodone prn for pain control.   -10/24 increase gabapentin to 200 mg 3 times daily, start lidocaine patch for chest wall 4. Mood/Behavior/Sleep: Team to provide ego support. LCSW to follow for evaluation and support.              --Melatonin prn for insomnia.              -antipsychotic agents: N/A at this time              --Neuropsych for coping skills.  5. Neuropsych/cognition: This patient is likely capable of making decisions on her own behalf. 6. Skin/Wound Care: Added proteiin supplements and Vitamin C to promote wound healing             --BID dressing changes with silvadene per plastics 7. Fluids/Electrolytes/Nutrition: Strict I/O. Low salt diet. Juven and prostat added             -- Recheck CMET tomorrow 8. VT/VF arrest: Life Vest with plans for ICD once leg wound resolves?  9. NICM w/ HFrEF:  GDMT being resumed by Cards--on Aldactone, Lasix, Cozaar, Farxiga and Lipitor             --Daily wts and monitor for signs of overload. Strict I/O   -10/24 think weight today was incorrect, patient was seen by cardiology today-appreciate  assistance   [facility] Weights   12/18/22 0900 12/19/22 0353  Weight: 133.5 kg (!) 151 kg    10.  [name] wound with necrosis: Plastic consulted 10/22-->now on silvadene bid for local care --supplements added to promote wound healing.  11.  T2DM: Hgb A1c-  Was pm metformin/jardiance combo and Monjuaro PTA             --monitor BS ac/hs. AKI has resolved-->resume low dose metformin and titrate as indicated  -10/24 CBGs stable continue current regimen for now  CBG (last 3)  Recent Labs    12/19/22 0613 12/19/22 1137 12/19/22 1630  GLUCAP 119* 116* 94    12.  Acute renal failure vs CKD IIIa: Resolving   -Recheck tomorrow 13. H/o severe recurrent depression w/anxiety: Vraylar and Lexapro discontinued due to concerns of QT prolongation             --Is being followed by behavioral health/Dr. Elsie Saas. Had [facility] urgent care visit 09/25/22 --continue Xanax prn   14. OSA: Continue CPAP 15. H/o Lumbar spinal stenosis and  R-knee pain:  Resume gabapentin (mood and pain). She was going to outpatient therapy.   16. Morbid Obesity BMI-50.5: Multifactorial. Encourage/educate on life style and diet changes to help with wt loss and improve overall activity and health. .  17. ADD: Discussed need for activating medication (mood and energy per char review)--has ritalin on home meds but just approved  --Ritalin added per secure chat with Dr. Bjorn Pippin.  18. Vaginal discomfort: Likely due to candidiasis --miconazole added for treatment.  19.  Hyperlipidemia: continue statin      LOS: 2 days A FACE TO FACE EVALUATION WAS PERFORMED  Fanny Dance 12/19/2022, 4:44 PM

## 2022-12-20 ENCOUNTER — Inpatient Hospital Stay (HOSPITAL_COMMUNITY): Payer: Medicare Other

## 2022-12-20 DIAGNOSIS — I959 Hypotension, unspecified: Secondary | ICD-10-CM

## 2022-12-20 DIAGNOSIS — R5381 Other malaise: Secondary | ICD-10-CM | POA: Diagnosis not present

## 2022-12-20 DIAGNOSIS — R6883 Chills (without fever): Secondary | ICD-10-CM

## 2022-12-20 DIAGNOSIS — I5022 Chronic systolic (congestive) heart failure: Secondary | ICD-10-CM | POA: Diagnosis not present

## 2022-12-20 DIAGNOSIS — R6884 Jaw pain: Secondary | ICD-10-CM

## 2022-12-20 DIAGNOSIS — M79601 Pain in right arm: Secondary | ICD-10-CM

## 2022-12-20 DIAGNOSIS — I469 Cardiac arrest, cause unspecified: Secondary | ICD-10-CM | POA: Diagnosis not present

## 2022-12-20 LAB — CBC WITH DIFFERENTIAL/PLATELET
Abs Immature Granulocytes: 0.13 10*3/uL — ABNORMAL HIGH (ref 0.00–0.07)
Basophils Absolute: 0.1 10*3/uL (ref 0.0–0.1)
Basophils Relative: 1 %
Eosinophils Absolute: 0.1 10*3/uL (ref 0.0–0.5)
Eosinophils Relative: 1 %
HCT: 40.3 % (ref 36.0–46.0)
Hemoglobin: 13.6 g/dL (ref 12.0–15.0)
Immature Granulocytes: 1 %
Lymphocytes Relative: 13 %
Lymphs Abs: 1.8 10*3/uL (ref 0.7–4.0)
MCH: 28.8 pg (ref 26.0–34.0)
MCHC: 33.7 g/dL (ref 30.0–36.0)
MCV: 85.2 fL (ref 80.0–100.0)
Monocytes Absolute: 1.2 10*3/uL — ABNORMAL HIGH (ref 0.1–1.0)
Monocytes Relative: 9 %
Neutro Abs: 11.1 10*3/uL — ABNORMAL HIGH (ref 1.7–7.7)
Neutrophils Relative %: 75 %
Platelets: 293 10*3/uL (ref 150–400)
RBC: 4.73 MIL/uL (ref 3.87–5.11)
RDW: 14.6 % (ref 11.5–15.5)
WBC: 14.4 10*3/uL — ABNORMAL HIGH (ref 4.0–10.5)
nRBC: 0 % (ref 0.0–0.2)

## 2022-12-20 LAB — GLUCOSE, CAPILLARY
Glucose-Capillary: 112 mg/dL — ABNORMAL HIGH (ref 70–99)
Glucose-Capillary: 194 mg/dL — ABNORMAL HIGH (ref 70–99)
Glucose-Capillary: 109 mg/dL — ABNORMAL HIGH (ref 70–99)
Glucose-Capillary: 115 mg/dL — ABNORMAL HIGH (ref 70–99)

## 2022-12-20 LAB — BASIC METABOLIC PANEL
Anion gap: 13 (ref 5–15)
BUN: 23 mg/dL — ABNORMAL HIGH (ref 6–20)
CO2: 26 mmol/L (ref 22–32)
Calcium: 9 mg/dL (ref 8.9–10.3)
Chloride: 96 mmol/L — ABNORMAL LOW (ref 98–111)
Creatinine, Ser: 1.06 mg/dL — ABNORMAL HIGH (ref 0.44–1.00)
GFR, Estimated: >60 mL/min (ref >60)
Glucose, Bld: 128 mg/dL — ABNORMAL HIGH (ref 70–99)
Potassium: 3.9 mmol/L (ref 3.5–5.1)
Sodium: 135 mmol/L (ref 135–145)

## 2022-12-20 MED ORDER — GABAPENTIN 300 MG PO CAPS
300.0000 mg | ORAL_CAPSULE | Freq: Three times a day (TID) | ORAL | Status: DC
  Administered 2022-12-20 – 2022-12-30 (×31): 300 mg via ORAL
  Filled 2022-12-20 (×31): qty 1

## 2022-12-20 MED ORDER — LOSARTAN POTASSIUM 25 MG PO TABS
25.0000 mg | ORAL_TABLET | Freq: Every day | ORAL | Status: DC
  Administered 2022-12-21: 25 mg via ORAL
  Filled 2022-12-20: qty 1

## 2022-12-20 MED ORDER — ENOXAPARIN SODIUM 80 MG/0.8ML IJ SOSY
0.5000 mg/kg | PREFILLED_SYRINGE | INTRAMUSCULAR | Status: DC
  Administered 2022-12-20 – 2022-12-29 (×10): 75 mg via SUBCUTANEOUS
  Filled 2022-12-20 (×11): qty 0.75

## 2022-12-20 NOTE — Progress Notes (Signed)
Occupational Therapy Session Note  Patient Details  Name: [name] MRN: [id number] Date of Birth: 08-18-1965  Today's Date: 12/20/2022 OT Individual Time: Missed 15 min due to am meal and nursing care, 815-900, 1301-1330 OT Individual Time Calculation (min): 45 min, 29 min    Short Term Goals: Week 1:  OT Short Term Goal 1 (Week 1): STGs=LTGs due to patient's length of stay.  Skilled Therapeutic Interventions/Progress Updates:   Session 1:  Pt seen for skilled OT session with husband present bedside. Pt speaking with [name] nursing with full plate of uneaten breakfast on tray. Allowed 15 min to complete and returned. Missed 15 min of therapy time as a result. Will attempt to make up as able. Pt open to session upon OT checking back and pt in w/c. Moved pt sink side for warm hair wash cap, oral care and sponge bathing. MD assessed c/o of new R hand weakness or soreness. MD will check labs and otherwise did not feel any s/s correlate to cardiac or neuro issues. OT rec support on pillow. Pt reports she is making progress. Left pt w/c level with husband bed side and all safety measures and needs in place.    Pain: R shoulder and back pain resolved with support and repositioning    Session 2:  Pt completing xray upon OT arrival for brief visit this pm. Pt reports she believes recliner lever exacerbate R sh pain and OT feels this could be the case. OT rec staff assists with this to protect shoulders pt in agreement. Pt has been cleared for husband to assist with bathroom transfers and assist by PT. Pt in full dress and sweater now as husband provided assist prior. Pt able to use toilet with OT for voiding with CGA for amb and transfer and completed peri hygiene post-voiding with set up. Amb to and from ADL apt with 2 standing rests and seated rest on sofa. OT discussed basic energy conservation principles. Once back in room , set up in recliner with hand off to ST.   Pain: 2/10 R sh with  relief with rest and repositioning  Therapy Documentation Precautions:  Precautions Precautions: Fall Precaution Comments: Life Vest, Falls, Chronic low back and R-knee pain Restrictions Weight Bearing Restrictions: No RUE Weight Bearing: Weight bearing as tolerated   Therapy/Group: Individual Therapy  [name] 12/20/2022, 7:45 AM

## 2022-12-20 NOTE — Progress Notes (Signed)
   12/20/22 2114  BiPAP/CPAP/SIPAP  Reason BIPAP/CPAP not in use Non-compliant (pt refused)

## 2022-12-20 NOTE — IPOC Note (Signed)
Overall Plan of Care [facility]) Patient Details Name: [name] MRN: [id number] DOB: 03/12/1965  Admitting Diagnosis: Debility  Hospital Problems: Principal Problem:   Debility Active Problems:   Chronic systolic heart failure (HCC)   Chest wall pain   Diabetes mellitus (HCC)   Chronic kidney disease     Functional Problem List: Nursing Pain, Endurance, Medication Management, Skin Integrity, Nutrition, Safety  PT Balance, Behavior, Edema, Endurance, Motor, Pain, Safety, Sensory, Skin Integrity  OT Balance, Edema, Endurance, Pain, Safety, Skin Integrity  SLP Cognition  TR         Basic ADL's: OT Bathing, Dressing, Toileting     Advanced  ADL's: OT       Transfers: PT Car, Bed to Chair, Bed Mobility  OT Toilet, Tub/Shower     Locomotion: PT Ambulation, Stairs, Wheelchair Mobility     Additional Impairments: OT    SLP Social Cognition   Problem Solving, Memory, Attention, Awareness  TR      Anticipated Outcomes Item Anticipated Outcome  Self Feeding    Swallowing      Basic self-care  Marketing executive Transfers Supervision  Bowel/Bladder  n/a  Transfers  supervision  Locomotion  supervision  Communication     Cognition  Mod I  Pain  < 4 with prns  Safety/Judgment  manage w cues   Therapy Plan: PT Intensity: Minimum of 1-2 x/day ,45 to 90 minutes PT Frequency: 5 out of 7 days PT Duration Estimated Length of Stay: 10-12 days OT Intensity: Minimum of 1-2 x/day, 45 to 90 minutes OT Frequency: 5 out of 7 days OT Duration/Estimated Length of Stay: 10-12 days SLP Intensity: Minumum of 1-2 x/day, 30 to 90 minutes SLP Frequency: 3 to 5 out of 7 days SLP Duration/Estimated Length of Stay: 10- 14 days   Team Interventions: Nursing Interventions Patient/Family Education, Medication Management, Pain Management, Skin Care/Wound Management, Disease Management/Prevention, Discharge Planning  PT interventions Ambulation/gait  training, Cognitive remediation/compensation, Discharge planning, DME/adaptive equipment instruction, Functional mobility training, Pain management, Psychosocial support, Therapeutic Activities, UE/LE Strength taining/ROM, Metallurgist training, Neuromuscular re-education, Stair training, Therapeutic Exercise, Skin care/wound management, Patient/family education, Community reintegration, UE/LE Coordination activities, Disease management/prevention  OT Interventions Warden/ranger, Cognitive remediation/compensation, Firefighter, Discharge planning, Disease mangement/prevention, Fish farm manager, Functional mobility training, Neuromuscular re-education, Pain management, Patient/family education, Functional electrical stimulation, Self Care/advanced ADL retraining, Skin care/wound managment, Splinting/orthotics, Therapeutic Activities, Therapeutic Exercise, Psychosocial support, UE/LE Strength taining/ROM, UE/LE Coordination activities, Visual/perceptual remediation/compensation, Wheelchair propulsion/positioning  SLP Interventions Cognitive remediation/compensation, Internal/external aids, Speech/Language facilitation, Financial trader, Environmental controls, Functional tasks, Patient/family education, Therapeutic Activities  TR Interventions    SW/CM Interventions Discharge Planning, Psychosocial Support, Patient/Family Education   Barriers to Discharge MD  Medical stability and Wound care  Nursing Decreased caregiver support, Home environment access/layout 2 olevel 1 ste bil rails, 1/2 ba on main, B+B up flight of steps w spouse  PT Home environment access/layout, Wound Care    OT Inaccessible home environment, Home environment access/layout, Wound Care, Weight, Pending surgery    SLP      SW       Team Discharge Planning: Destination: PT-Home ,OT- Home , SLP-Home  Projected Follow-up: PT-24 hour supervision/assistance, Outpatient PT, OT-  Home  health OT, SLP-None Projected Equipment Needs: PT-To be determined, OT- To be determined, SLP-None recommended by SLP Equipment Details: PT- , OT-  Patient/family involved in discharge planning: PT- Patient,  OT-Patient, SLP-Patient  MD ELOS: 10-14 Medical Rehab Prognosis:  Excellent Assessment: The patient has been admitted for CIR therapies with the diagnosis of debility/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 The team will be addressing functional mobility, strength, stamina, balance, safety, adaptive techniques and equipment, self-care, bowel and bladder mgt, patient and caregiver education. Goals have been set at sup to mod I. Anticipated discharge destination is home.        See Team Conference Notes for weekly updates to the plan of care

## 2022-12-20 NOTE — Progress Notes (Signed)
Physical Therapy Session Note  Patient Details  Name: [name] MRN: [id number] Date of Birth: [birth year]  Today's Date: 12/20/2022 PT Individual Time: 1002-1100 PT Individual Time Calculation (min): 58 min   Short Term Goals: Week 1:  PT Short Term Goal 1 (Week 1): pt will ambulate 150 ft CGA PT Short Term Goal 2 (Week 1): pt will perform STS w/ supervision PT Short Term Goal 3 (Week 1): pt will perform bed mobility w/ minA PT Short Term Goal 4 (Week 1): pt will ambulate 8 stairs CGA PT Short Term Goal 5 (Week 1): pt will perform bed to chair transfer supervision  Skilled Therapeutic Interventions/Progress Updates: Pt presents sitting in w/c and eventually agreeable to therapy, although long explanations to PT.  Pt transfers sit to stand to RW w/ CG to supervision w/ spouse for sign-off for training to amb to BR.  Spouse cued for hand positioning w/ gait belt.  Training Ok'd on sheet and nursing notified.  Pt amb w/ rollator up to 100' w/ CGA and cues for posture.  Pt educated on positioning rollator along wall for stability when sitting.  Pt requires seated rest breaks 2/2 c/o UE weakness/fatigue.  Pt states> 50% WB through UES, and educated on increased reliance on LES.  Pt amb up/down ramp in small gym w/ CGA.  Pt amb back to room and spouse took into BR.  Pt returned to w/c and seat alarm on and all needs in reach, spouse present.     Therapy Documentation Precautions:  Precautions Precautions: Fall Precaution Comments: Life Vest, Falls, Chronic low back and R-knee pain Restrictions Weight Bearing Restrictions: No RUE Weight Bearing: Weight bearing as tolerated General:   Vital Signs:   Pain:6/10  Pain Assessment Pain Scale: 0-10 Pain Score: 8  Pain Location: Arm Pain Orientation: Right Pain Intervention(s): Medication (See eMAR)    Therapy/Group: Individual Therapy  Lucio Edward 12/20/2022, 12:21 PM

## 2022-12-20 NOTE — Progress Notes (Signed)
Speech Language Pathology Daily Session Note  Patient Details  Name: [name] MRN: [id number] Date of Birth: 1965-03-27  Today's Date: 12/20/2022 SLP Individual Time: 1334-1420 SLP Individual Time Calculation (min): 46 min  Short Term Goals: Week 1: SLP Short Term Goal 1 (Week 1): Patient will complete moderately complex problem solving with 90% acc with sup A SLP Short Term Goal 2 (Week 1): Patient will recall novel information given a 10- 15 minute delay with 90% acc given sup A SLP Short Term Goal 3 (Week 1): Patient will demonstrate sustained attention during functional tasks for >10 minutes given min multimodal A  Skilled Therapeutic Interventions: Skilled treatment session focused on cognitive goals. SLP facilitated a conversation regarding goals of SLP treatment with patient demonstrating verbosity with intermittent dysfluencies, suspect due to decreased thought organization and increased speech rate. Patient reports baseline deficits in attention and memory and feels that she already has compensatory strategies in place to assist with deficits due to history of ADD. Patient reports that she feels overwhelmed by her current situation and would like to focus on stress management but is unaware of current strategies and/or programs that are available in the community. SLP provided support throughout and generated goals with the patient to focus on in upcoming therapy sessions with emphasis on stress management and organization in order to maximize attention and recall of functional information. Patient left upright in recliner with all needs within reach. Continue with current plan of care.       Pain Pain Assessment Pain Scale: 0-10 Pain Score: 8  Pain Location: Arm Pain Orientation: Right Pain Intervention(s): Medication (See eMAR)  Therapy/Group: Individual Therapy  Lavren Lewan 12/20/2022, 2:36 PM

## 2022-12-20 NOTE — Progress Notes (Signed)
PROGRESS NOTE   Subjective/Complaints: Patient working with occupational therapy, brushing her teeth this morning.  She reports that last night she developed some chills.  Room was cold at the time and the symptom resolved later.  Later night she developed pain in her right arm that has been limiting her mobility in this extremity.  This is still present this morning although partially improved.  Additionally she reports pain in her jaw with chewing both sides.   ROS: Patient denies fever, headache, abdominal pain, diarrhea, motor or sensory changes, rash. + chills resolved    objective:   No results found. Recent Labs    12/18/22 0500  WBC 10.0  HGB 12.9  HCT 40.0  PLT 283   Recent Labs    12/18/22 0500 12/20/22 0604  NA 136 135  K 3.5 3.9  CL 99 96*  CO2 30 26  GLUCOSE 125* 128*  BUN 18 23*  CREATININE 1.15* 1.06*  CALCIUM 8.8* 9.0    Intake/Output Summary (Last 24 hours) at 12/20/2022 1402 Last data filed at 12/19/2022 1835 Gross per 24 hour  Intake 100 ml  Output --  Net 100 ml        Physical Exam: Vital Signs Blood pressure 108/75, pulse 97, temperature 98.5 F (36.9 C), temperature source Oral, resp. rate 18, height 5\' 4"  (1.626 m), weight (!) 151 kg, SpO2 93%.    General:  No apparent distress, sitting in on edge of bed, obese HEENT: NCAT, MMM  Tenderness noted at bilateral TMJ, pain with jaw movements Heart: Reg rate and rhythm. No murmurs rubs or gallops, wearing life vest  Chest: CTAB abdomen: Soft, non-tender, non-distended, bowel sounds positive. Extremities: large [name] wound covered in dressings with areas of serosanguineous drainage noted Psych: Cooperative, a little flat Skin: Clean and intact without signs of breakdown Neuro:  Alert and oriented x3, follows commands, cranial nerves II through XII intact, no speech or language deficits noted, + memory deficits Sensation intact light  touch in all 4 extremities Poor safety awareness.   Strength 4+ out of 5 in left upper extremity Strength 4- out of 5 right upper extremity-limited by pain Strength 4+ out of 5 right lower extremity Strength 2-3 out of 5 left lower extremity Musculoskeletal: No joint swelling noted.  Diffuse tenderness throughout right upper extremity and pain with range of motion at her shoulder, elbow, wrist.  Pain with passive range of motion of the right shoulder.   Assessment/Plan: 1. Functional deficits which require 3+ hours per day of interdisciplinary therapy in a comprehensive inpatient rehab setting. Physiatrist is providing close team supervision and 24 hour management of active medical problems listed below. Physiatrist and rehab team continue to assess barriers to discharge/monitor patient progress toward functional and medical goals  Care Tool:  Bathing    Body parts bathed by patient: Right arm, Left arm, Chest, Abdomen, Front perineal area, Face   Body parts bathed by helper: Buttocks, Right lower leg, Right upper leg, Left upper leg Body parts n/a: Left lower leg   Bathing assist Assist Level: Moderate Assistance - Patient 50 - 74%     Upper Body Dressing/Undressing Upper body dressing  What is the patient wearing?: Hospital gown only    Upper body assist Assist Level: Minimal Assistance - Patient > 75%    Lower Body Dressing/Undressing Lower body dressing      What is the patient wearing?: Hospital gown only     Lower body assist Assist for lower body dressing: Minimal Assistance - Patient > 75%     Toileting Toileting    Toileting assist Assist for toileting: Moderate Assistance - Patient 50 - 74%     Transfers Chair/bed transfer  Transfers assist     Chair/bed transfer assist level: Contact Guard/Touching assist     Locomotion Ambulation   Ambulation assist      Assist level: Contact Guard/Touching assist Assistive device: Rollator Max  distance: 100   Walk 10 feet activity   Assist     Assist level: Contact Guard/Touching assist Assistive device: Rollator   Walk 50 feet activity   Assist    Assist level: Contact Guard/Touching assist Assistive device: Rollator    Walk 150 feet activity   Assist Walk 150 feet activity did not occur: Safety/medical concerns         Walk 10 feet on uneven surface  activity   Assist Walk 10 feet on uneven surfaces activity did not occur: Safety/medical concerns   Assist level: Contact Guard/Touching assist (ramp.) Assistive device: Rollator   Wheelchair     Assist Is the patient using a wheelchair?: Yes Type of Wheelchair: Manual    Wheelchair assist level: Dependent - Patient 0%      Wheelchair 50 feet with 2 turns activity    Assist        Assist Level: Dependent - Patient 0%   Wheelchair 150 feet activity     Assist      Assist Level: Dependent - Patient 0%   Blood pressure 108/75, pulse 97, temperature 98.5 F (36.9 C), temperature source Oral, resp. rate 18, height 5\' 4"  (1.626 m), weight (!) 151 kg, SpO2 93%.  Medical Problem List and Plan: 1. 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 Continue life vest              -patient may shower             -ELOS/Goals: 10-14 day,  PT/OT supervision to mod I, SLP mod I              -Continue CIR  -Discussed safety with pt, call nurses to get up. 2.  Antithrombotics: -DVT/anticoagulation:  Pharmaceutical: Lovenox             -antiplatelet therapy: N/A 3. Pain Management:  Oxycodone prn for pain control.   -10/24 increase gabapentin to 200 mg 3 times daily, start lidocaine patch for chest wall  -10/25 increase gabapentin to 300 mg 3 times daily 4. Mood/Behavior/Sleep: Team to provide ego support. LCSW to follow for evaluation and support.              --Melatonin prn for insomnia.              -antipsychotic agents: N/A at this time               --Neuropsych for coping skills.  5. Neuropsych/cognition: This patient is likely capable of making decisions on her own behalf. 6. Skin/Wound Care: Added proteiin supplements and Vitamin C to promote wound healing             --BID dressing changes with silvadene per  plastics 7. Fluids/Electrolytes/Nutrition: Strict I/O. Low salt diet. Juven and prostat added             -- Recheck CMET tomorrow 8. VT/VF arrest: Life Vest with plans for ICD once leg wound resolves?  9. NICM w/ HFrEF:  GDMT being resumed by Cards--on Aldactone, Lasix, Cozaar, Farxiga and Lipitor             --Daily wts and monitor for signs of overload. Strict I/O   -10/25 will asked nursing to recheck her weight today, increase in weight from 10/23 does not appear realistic   Filed Weights   12/18/22 0900 12/19/22 0353 12/20/22 0305  Weight: 133.5 kg (!) 151 kg (!) 151 kg    10. [name] wound with necrosis: Plastic consulted 10/22-->now on silvadene bid for local care --supplements added to promote wound healing.  11.  T2DM: Hgb A1c-  Was pm metformin/jardiance combo and Monjuaro PTA             --monitor BS ac/hs. AKI has resolved-->resume low dose metformin and titrate as indicated  -10/25 CBGs well-controlled, continue to monitor  CBG (last 3)  Recent Labs    12/19/22 2045 12/20/22 0622 12/20/22 1127  GLUCAP 127* 112* 194*    12.  Acute renal failure vs CKD IIIa: Resolving   -BUN a little higher at 23,creatinine down to 1.06.  Overall stable continue to monitor 13. H/o severe recurrent depression w/anxiety: Vraylar and Lexapro discontinued due to concerns of QT prolongation             --Is being followed by behavioral health/Dr. Elsie Saas. Had [facility] care visit 09/25/22 --continue Xanax prn   14. OSA: Continue CPAP 15. H/o Lumbar spinal stenosis and  R-knee pain:  Resume gabapentin (mood and pain). She was going to outpatient therapy.   16. Morbid Obesity BMI-50.5: Multifactorial. Encourage/educate on life style  and diet changes to help with wt loss and improve overall activity and health. .  17. ADD: Discussed need for activating medication (mood and energy per char review)--has ritalin on home meds but just approved  --Ritalin added per secure chat with Dr. Bjorn Pippin.  18. Vaginal discomfort: Likely due to candidiasis --miconazole added for treatment.  19.  Hyperlipidemia: continue statin 20.  Right upper extremity pain/right shoulder pain  -Will check x-ray of her right shoulder as this seems to be the greatest location of pain at this time.  Suspect musculoskeletal pain due to increased activity. 21.  Bilateral jaw pain.   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  She has pain with movement and tenderness to palpation of her jaw.  Will try Voltaren gel. 22.  Chills reported yesterday.  Reports this has resolved.  She has been afebrile  -Will recheck CBC 23. Hypotension  -Will decrease losartan to 25mg  daily  LOS: 3 days A FACE TO FACE EVALUATION WAS PERFORMED  Fanny Dance 12/20/2022, 2:02 PM

## 2022-12-20 NOTE — Progress Notes (Signed)
Saline nasal spray ordered for bedside   12/19/22 2230  BiPAP/CPAP/SIPAP  BiPAP/CPAP/SIPAP Pt Type Adult  Reason BIPAP/CPAP not in use Other(comment) (Refused for nasal congestion)  EPAP 11 cmH2O

## 2022-12-21 ENCOUNTER — Inpatient Hospital Stay (HOSPITAL_COMMUNITY): Payer: Medicare Other

## 2022-12-21 DIAGNOSIS — R5381 Other malaise: Secondary | ICD-10-CM | POA: Diagnosis not present

## 2022-12-21 LAB — CBC
HCT: 35 % — ABNORMAL LOW (ref 36.0–46.0)
Hemoglobin: 11.5 g/dL — ABNORMAL LOW (ref 12.0–15.0)
MCH: 28.3 pg (ref 26.0–34.0)
MCHC: 32.9 g/dL (ref 30.0–36.0)
MCV: 86 fL (ref 80.0–100.0)
Platelets: 272 10*3/uL (ref 150–400)
RBC: 4.07 MIL/uL (ref 3.87–5.11)
RDW: 14.7 % (ref 11.5–15.5)
WBC: 9.2 10*3/uL (ref 4.0–10.5)
nRBC: 0 % (ref 0.0–0.2)

## 2022-12-21 LAB — GLUCOSE, CAPILLARY
Glucose-Capillary: 123 mg/dL — ABNORMAL HIGH (ref 70–99)
Glucose-Capillary: 117 mg/dL — ABNORMAL HIGH (ref 70–99)
Glucose-Capillary: 120 mg/dL — ABNORMAL HIGH (ref 70–99)
Glucose-Capillary: 123 mg/dL — ABNORMAL HIGH (ref 70–99)

## 2022-12-21 NOTE — Progress Notes (Signed)
Speech Language Pathology Daily Session Note  Patient Details  Name: Tayzia Soble MRN: [id number] Date of Birth: 1965/07/26  Today's Date: 12/21/2022 SLP Individual Time: 1100-1203 SLP Individual Time Calculation (min): 63 min  Short Term Goals: Week 1: SLP Short Term Goal 1 (Week 1): Patient will complete moderately complex problem solving with 90% acc with sup A SLP Short Term Goal 2 (Week 1): Patient will recall novel information given a 10- 15 minute delay with 90% acc given sup A SLP Short Term Goal 3 (Week 1): Patient will demonstrate sustained attention during functional tasks for >10 minutes given min multimodal A  Skilled Therapeutic Interventions: Skilled therapy session focused on cognitive goals. Patient reported wanting to target stress management skills during session. SLP facilitated stress management by encouraging patient to name current life stressors and create an action plan. Patient and SLP hypothesized resources to utilize during heightened moments of stress including deep breathing exercises and prescribed medications. SLP reviewed different types of deep breathing and provided modiA for patient to utilize. During deep breathing and stress reduction tasks, patient able to sustain attention for ~8-10 minutes with modiA. Patient left in chair with call bell in reach. Continue POC.  Pain No reports of pain  Therapy/Group: Individual Therapy   Channah Godeaux M.A., CF-SLP 12/21/2022, 12:08 PM

## 2022-12-21 NOTE — Progress Notes (Signed)
Physical Therapy Session Note  Patient Details  Name: [name] MRN: [id number] Date of Birth: 1965/09/21  Today's Date: 12/21/2022 PT Individual Time: 0908-1008 PT Individual Time Calculation (min): 60 min   Short Term Goals: Week 1:  PT Short Term Goal 1 (Week 1): pt will ambulate 150 ft CGA PT Short Term Goal 2 (Week 1): pt will perform STS w/ supervision PT Short Term Goal 3 (Week 1): pt will perform bed mobility w/ minA PT Short Term Goal 4 (Week 1): pt will ambulate 8 stairs CGA PT Short Term Goal 5 (Week 1): pt will perform bed to chair transfer supervision  Skilled Therapeutic Interventions/Progress Updates: Patient sitting in [facility] with husband, nursing student and RN present on entrance to room. Patient alert and agreeable to PT session.   Patient reported no feeling much of any pain at beginning of PT session. Pt addressing concerns of R hand weakness that started about 2 days ago with nsg and other medical staff made aware, but pt cannot remember if she talked about it to her attending physician. PTA wrote down question on piece of paper followed by other concerns (why L LE has wounds, and what exactly happened to her to bring to current presentation) so that pt can remember as pt reported it is difficult sometimes to remember questions due to her ADHD. (PTA communicated to covering physician with physician stating that pt brought up concern after PT session). PTA built further report in asking about therapy services and how pt feels she is doing with pt stating that she feels like walking is going well, but that it feels like some days she is taking 2 steps forward and 2 steps back. PTA encouraged pt that some days will seem like no progress is being made, but everyday here has the ultimate goal of increasing tolerance to OOB activities (pt understood). Pt sit to stand from rollator in room with supervision, and ambulated to nsg station with CGA for safety. Pt required rest break at nsg  station and recalled safety parking of rollator (brakes locked in front of wall). Pt sat with supervision and brought up concerns about increasing WB on L UE due to L LE having wounds, and pt having increased fear of pain/guarding due to fear of increasing pain. PTA suggested ambulation without rollator and with L HHA to assess level of WB placed on L UE. Pt ambulated to ortho gym with supervision and sat at edge of mat with VC to maintain safe proximity of rollator. Pt assisted with L HHA at first with taking 2 steps forward and back with little to no WB on L UE. Pt then ambulated roughly 10' from edge of mat <> chair with L HHA and little to now WB on L UE. Pt then guided through ambulation with no HHA and CGA with no LOB. Pt educated that pt's primary focus is fear, which is causing increased WB on L UE. Pt understood. Pt ambulated back to room in rollator without rest break (roughly 130') with close supervision/CGA. Pt back in room and PTA provided active listening about pt's concerns of a personal matter. PTA suggested delegation to proper resources that could assist. Pt reported wanting to think about it for now. PTA encouraged pt to reach out to attending rehab team so that team is aware if pt wants to converse further on matter.   Patient sitting in WC at end of session with brakes locked, and all needs within reach.  Therapy Documentation Precautions:  Precautions Precautions: Fall Precaution Comments: Life Vest, Falls, Chronic low back and R-knee pain Restrictions Weight Bearing Restrictions: No RUE Weight Bearing: Weight bearing as tolerated   Therapy/Group: Individual Therapy  Nancye Grumbine PTA 12/21/2022, 12:27 PM

## 2022-12-21 NOTE — Progress Notes (Signed)
PROGRESS NOTE   Subjective/Complaints: Patient has many questions, she has been having right sided weakness and numbness in both hands, cervical spine XR ordered, she has been told she does not have carpal tunnel   ROS: Patient denies fever, headache, abdominal pain, diarrhea, motor or sensory changes, rash. +right sided weakness + chills resolved    objective:   DG Shoulder Right  Result Date: 12/20/2022 CLINICAL DATA:  Pain EXAM: RIGHT SHOULDER - 2+ VIEW COMPARISON:  Right shoulder x-ray 07/26/2010 FINDINGS: There is no evidence of fracture or dislocation. There is no evidence of arthropathy or other focal bone abnormality. Soft tissues are unremarkable. IMPRESSION: Negative. Electronically Signed   By: Darliss Cheney M.D.   On: 12/20/2022 17:07   Recent Labs    12/20/22 1446 12/21/22 0735  WBC 14.4* 9.2  HGB 13.6 11.5*  HCT 40.3 35.0*  PLT 293 272   Recent Labs    12/20/22 0604  NA 135  K 3.9  CL 96*  CO2 26  GLUCOSE 128*  BUN 23*  CREATININE 1.06*  CALCIUM 9.0   No intake or output data in the 24 hours ending 12/21/22 1052       Physical Exam: Vital Signs Blood pressure (!) 97/55, pulse 85, temperature 98.2 F (36.8 C), resp. rate 18, height 5\' 4"  (1.626 m), weight (!) 148.8 kg, SpO2 96%.    General:  No apparent distress, sitting in on edge of bed, obese HEENT: NCAT, MMM  Tenderness noted at bilateral TMJ, pain with jaw movements Heart: Reg rate and rhythm. No murmurs rubs or gallops, wearing life vest  Chest: CTAB abdomen: Soft, non-tender, non-distended, bowel sounds positive. Extremities: large [name] wound covered in dressings with areas of serosanguineous drainage noted Psych: Cooperative, a little flat Skin: Clean and intact without signs of breakdown Neuro:  Alert and oriented x3, follows commands, cranial nerves II through XII intact, no speech or language deficits noted, + memory  deficits Sensation intact light touch in all 4 extremities Poor safety awareness.   Strength 4+ out of 5 in left upper extremity Strength 4- out of 5 right upper extremity-limited by pain, but only 3/5 in right hand grip Strength 4+ out of 5 right lower extremity Strength 2-3 out of 5 left lower extremity Musculoskeletal: No joint swelling noted.  Diffuse tenderness throughout right upper extremity and pain with range of motion at her shoulder, elbow, wrist.  Pain with passive range of motion of the right shoulder.   Assessment/Plan: 1. Functional deficits which require 3+ hours per day of interdisciplinary therapy in a comprehensive inpatient rehab setting. Physiatrist is providing close team supervision and 24 hour management of active medical problems listed below. Physiatrist and rehab team continue to assess barriers to discharge/monitor patient progress toward functional and medical goals  Care Tool:  Bathing    Body parts bathed by patient: Right arm, Left arm, Chest, Abdomen, Front perineal area, Face   Body parts bathed by helper: Buttocks, Right lower leg, Right upper leg, Left upper leg Body parts n/a: Left lower leg   Bathing assist Assist Level: Moderate Assistance - Patient 50 - 74%     Upper Body Dressing/Undressing Upper  body dressing   What is the patient wearing?: Hospital gown only    Upper body assist Assist Level: Minimal Assistance - Patient > 75%    Lower Body Dressing/Undressing Lower body dressing      What is the patient wearing?: Hospital gown only     Lower body assist Assist for lower body dressing: Minimal Assistance - Patient > 75%     Toileting Toileting    Toileting assist Assist for toileting: Moderate Assistance - Patient 50 - 74%     Transfers Chair/bed transfer  Transfers assist     Chair/bed transfer assist level: Contact Guard/Touching assist     Locomotion Ambulation   Ambulation assist      Assist level:  Contact Guard/Touching assist Assistive device: Rollator Max distance: 100   Walk 10 feet activity   Assist     Assist level: Contact Guard/Touching assist Assistive device: Rollator   Walk 50 feet activity   Assist    Assist level: Contact Guard/Touching assist Assistive device: Rollator    Walk 150 feet activity   Assist Walk 150 feet activity did not occur: Safety/medical concerns         Walk 10 feet on uneven surface  activity   Assist Walk 10 feet on uneven surfaces activity did not occur: Safety/medical concerns   Assist level: Contact Guard/Touching assist (ramp.) Assistive device: Rollator   Wheelchair     Assist Is the patient using a wheelchair?: Yes Type of Wheelchair: Manual    Wheelchair assist level: Dependent - Patient 0%      Wheelchair 50 feet with 2 turns activity    Assist        Assist Level: Dependent - Patient 0%   Wheelchair 150 feet activity     Assist      Assist Level: Dependent - Patient 0%   Blood pressure (!) 97/55, pulse 85, temperature 98.2 F (36.8 C), resp. rate 18, height 5\' 4"  (1.626 m), weight (!) 148.8 kg, SpO2 96%.  Medical Problem List and Plan: 1. 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 Continue life vest              -patient may shower             -ELOS/Goals: 10-14 day,  PT/OT supervision to mod I, SLP mod I              -Continue CIR  -Discussed safety with pt, call nurses to get up. 2.  Antithrombotics: -DVT/anticoagulation:  Pharmaceutical: Lovenox             -antiplatelet therapy: N/A 3. Pain Management:  Oxycodone prn for pain control.   -10/24 increase gabapentin to 200 mg 3 times daily, start lidocaine patch for chest wall  -10/25 increase gabapentin to 300 mg 3 times daily 4. Mood/Behavior/Sleep: Team to provide ego support. LCSW to follow for evaluation and support.              --Melatonin prn for insomnia.               -antipsychotic agents: N/A at this time              --Neuropsych for coping skills.  5. Neuropsych/cognition: This patient is likely capable of making decisions on her own behalf. 6. Severe left lower extremity wound: wound examined with nursing and provided patient education: Added proteiin supplements and Vitamin C to promote wound healing             --  BID dressing changes with silvadene per plastics 7. Fluids/Electrolytes/Nutrition: Strict I/O. Low salt diet. Juven and prostat added             -- Recheck CMET tomorrow 8. VT/VF arrest: Life Vest with plans for ICD once leg wound resolves?  9. NICM w/ HFrEF:  GDMT being resumed by Cards--on Aldactone, Lasix, Cozaar, Farxiga and Lipitor             --Daily wts and monitor for signs of overload. Strict I/O   -10/25 will asked nursing to recheck her weight today, increase in weight from 10/23 does not appear realistic   Filed Weights   12/19/22 0353 12/20/22 0305 12/21/22 0500  Weight: (!) 151 kg (!) 151 kg (!) 148.8 kg    10. [name] wound with necrosis: Plastic consulted 10/22-->now on silvadene bid for local care --supplements added to promote wound healing.  11.  T2DM: Hgb A1c-  Was pm metformin/jardiance combo and Monjuaro PTA             --monitor BS ac/hs. AKI has resolved-->resume low dose metformin and titrate as indicated  -10/25 CBGs well-controlled, continue to monitor  CBG (last 3)  Recent Labs    12/20/22 1627 12/20/22 2111 12/21/22 0624  GLUCAP 109* 115* 123*    12.  Acute renal failure vs CKD IIIa: Resolving   -BUN a little higher at 23,creatinine down to 1.06.  Overall stable continue to monitor. D/c losartan 13. H/o severe recurrent depression w/anxiety: Vraylar and Lexapro discontinued due to concerns of QT prolongation             --Is being followed by behavioral health/Dr. Elsie Saas. Had [facility] care visit 09/25/22 --continue Xanax prn   14. OSA: Continue CPAP 15. H/o Lumbar spinal stenosis and  R-knee pain:   Resume gabapentin (mood and pain). She was going to outpatient therapy.   16. Morbid Obesity BMI-50.5: Multifactorial. Encourage/educate on life style and diet changes to help with wt loss and improve overall activity and health. .  17. ADD: Discussed need for activating medication (mood and energy per char review)--has ritalin on home meds but just approved  --Ritalin added per secure chat with Dr. Bjorn Pippin.  18. Vaginal discomfort: Likely due to candidiasis --miconazole added for treatment.  19.  Hyperlipidemia: continue statin 20.  Right upper extremity pain/right shoulder pain  -Will check x-ray of her right shoulder as this seems to be the greatest location of pain at this time.  Suspect musculoskeletal pain due to increased activity. 21.  Bilateral jaw pain.   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  She has pain with movement and tenderness to palpation of her jaw.  Will try Voltaren gel. 22.  Chills reported yesterday.  Reports this has resolved.  She has been afebrile  -Will recheck CBC  23. Hypotension  D/c losartan  24. Right arm weakness: -cervical spin XR ordered    LOS: 4 days A FACE TO FACE EVALUATION WAS PERFORMED  Drema Pry Honore Wipperfurth 12/21/2022, 10:52 AM

## 2022-12-21 NOTE — Progress Notes (Signed)
Orthopedic Tech Progress Note Patient Details:  [name] [birth year] [id number]  Bilateral wrist braces were placed on computer in pt room as she was in radiology when I arrived. Joni Reining, LPN is aware of this.  Ortho Devices Type of Ortho Device: Velcro wrist splint Ortho Device/Splint Location: for BUE Ortho Device/Splint Interventions: Ordered      Docia Furl 12/21/2022, 6:16 PM

## 2022-12-21 NOTE — Progress Notes (Signed)
Speech Language Pathology Daily Session Note  Patient Details  Name: [name] MRN: [id number] Date of Birth: [birth year]  Today's Date: 12/21/2022 SLP Individual Time: 4098-1191 SLP Individual Time Calculation (min): 34 min  Short Term Goals: Week 1: SLP Short Term Goal 1 (Week 1): Patient will complete moderately complex problem solving with 90% acc with sup A SLP Short Term Goal 2 (Week 1): Patient will recall novel information given a 10- 15 minute delay with 90% acc given sup A SLP Short Term Goal 3 (Week 1): Patient will demonstrate sustained attention during functional tasks for >10 minutes given min multimodal A  Skilled Therapeutic Interventions: Afternoon speech therapy session continued to address cognitive goals. SLP reviewed additional stress management strategies including body mediation and additional breathing techniques and patient participated through modiA. Patient able to recall activities completed in prior session with modiA. Stress management resources were provided through online videos and handouts. Patient continues to be very verbose and emotional throughout sessions, requiring re-direction to attend to tasks and encouragement. Patient left in [facility] with all bell in reach. Continue POC.  Pain Pain Assessment Pain Scale: 0-10 Pain Score: 9  Pain Type: Acute pain Pain Location: Chest Pain Orientation: Right;Left Pain Descriptors / Indicators: Discomfort;Sore Pain Frequency: Constant Pain Intervention(s): Medication (See eMAR)  Therapy/Group: Individual Therapy  Arrie Borrelli M.A., CF-SLP 12/21/2022, 3:25 PM

## 2022-12-21 NOTE — Progress Notes (Signed)
Occupational Therapy Session Note  Patient Details  Name: [name] MRN: [id number] Date of Birth: 05-12-1965  Today's Date: 12/21/2022 OT Individual Time: 4401-0272 OT Individual Time Calculation (min): 69 min    Short Term Goals: Week 1:  OT Short Term Goal 1 (Week 1): STGs=LTGs due to patient's length of stay.  Skilled Therapeutic Interventions/Progress Updates:  Pt received sitting in [facility] for skilled OT session with focus on functional mobility, standing balance, and activity tolerance. Pt agreeable to interventions, demonstrating overall pleasant mood. Pt with un-rated neuropathic pain in RUE>LUE, and chronic low back pain. OT offering intermediate rest breaks and positioning suggestions throughout session to address pain/fatigue and maximize participation/safety in session.   Pt hyper-verbose and tearful this session, sharing current frustration and anxieties in regards to medical complexities. Active listening, as well as, time/space to share feelings/emotions provided.   Pt ambulates from room<>ortho gym with use of rollator + close supervision, cuing provided for relaxing BUE. In ortho gym, pt participates in standing activities with use of BITs modality for dual challenege with inclusion of cognitive tasks.  Trail Making Test, Part B---2.36 secs with 3 interruptions Visual Scanning & Reaction Test---Reaction Time=1.86 sec  Pt stands with unilateral support on rollator + close supervision. Back in patient room, pt toilets with same level of assistance provided for transfer and total A for peri-care.   Pt remained sitting in WC, SLP present, and all other immediate needs met at end of session. Pt continues to be appropriate for skilled OT intervention to promote further functional independence.   Therapy Documentation Precautions:  Precautions Precautions: Fall Precaution Comments: Life Vest, Falls, Chronic low back and R-knee pain Restrictions Weight Bearing Restrictions:  No RUE Weight Bearing: Weight bearing as tolerated   Therapy/Group: Individual Therapy  Lou Cal, OTR/L, MSOT  12/21/2022, 2:37 PM

## 2022-12-22 ENCOUNTER — Other Ambulatory Visit: Payer: Self-pay | Admitting: Internal Medicine

## 2022-12-22 DIAGNOSIS — I5042 Chronic combined systolic (congestive) and diastolic (congestive) heart failure: Secondary | ICD-10-CM

## 2022-12-22 DIAGNOSIS — R5381 Other malaise: Secondary | ICD-10-CM | POA: Diagnosis not present

## 2022-12-22 DIAGNOSIS — E1165 Type 2 diabetes mellitus with hyperglycemia: Secondary | ICD-10-CM

## 2022-12-22 DIAGNOSIS — I469 Cardiac arrest, cause unspecified: Secondary | ICD-10-CM | POA: Diagnosis not present

## 2022-12-22 LAB — CBC WITH DIFFERENTIAL/PLATELET
Abs Immature Granulocytes: 0.07 10*3/uL (ref 0.00–0.07)
Basophils Absolute: 0.1 10*3/uL (ref 0.0–0.1)
Basophils Relative: 1 %
Eosinophils Absolute: 0.1 10*3/uL (ref 0.0–0.5)
Eosinophils Relative: 1 %
HCT: 35.5 % — ABNORMAL LOW (ref 36.0–46.0)
Hemoglobin: 11.8 g/dL — ABNORMAL LOW (ref 12.0–15.0)
Immature Granulocytes: 1 %
Lymphocytes Relative: 18 %
Lymphs Abs: 2 10*3/uL (ref 0.7–4.0)
MCH: 28.7 pg (ref 26.0–34.0)
MCHC: 33.2 g/dL (ref 30.0–36.0)
MCV: 86.4 fL (ref 80.0–100.0)
Monocytes Absolute: 0.9 10*3/uL (ref 0.1–1.0)
Monocytes Relative: 8 %
Neutro Abs: 8.2 10*3/uL — ABNORMAL HIGH (ref 1.7–7.7)
Neutrophils Relative %: 71 %
Platelets: 302 10*3/uL (ref 150–400)
RBC: 4.11 MIL/uL (ref 3.87–5.11)
RDW: 14.5 % (ref 11.5–15.5)
WBC: 11.3 10*3/uL — ABNORMAL HIGH (ref 4.0–10.5)
nRBC: 0 % (ref 0.0–0.2)

## 2022-12-22 LAB — GLUCOSE, CAPILLARY
Glucose-Capillary: 124 mg/dL — ABNORMAL HIGH (ref 70–99)
Glucose-Capillary: 163 mg/dL — ABNORMAL HIGH (ref 70–99)
Glucose-Capillary: 138 mg/dL — ABNORMAL HIGH (ref 70–99)
Glucose-Capillary: 105 mg/dL — ABNORMAL HIGH (ref 70–99)

## 2022-12-22 MED ORDER — LOSARTAN POTASSIUM 25 MG PO TABS
12.5000 mg | ORAL_TABLET | Freq: Every day | ORAL | Status: DC
  Administered 2022-12-22 – 2022-12-30 (×9): 12.5 mg via ORAL
  Filled 2022-12-22 (×9): qty 1

## 2022-12-22 MED ORDER — OXYCODONE HCL 5 MG PO TABS
10.0000 mg | ORAL_TABLET | ORAL | Status: DC | PRN
  Administered 2022-12-22 – 2022-12-30 (×20): 10 mg via ORAL
  Filled 2022-12-22 (×22): qty 2

## 2022-12-22 MED ORDER — TOPIRAMATE 25 MG PO TABS
25.0000 mg | ORAL_TABLET | Freq: Two times a day (BID) | ORAL | Status: DC
  Administered 2022-12-22 (×2): 25 mg via ORAL
  Filled 2022-12-22 (×3): qty 1

## 2022-12-22 MED ORDER — MAGNESIUM GLUCONATE 500 MG PO TABS
250.0000 mg | ORAL_TABLET | Freq: Every day | ORAL | Status: DC
  Administered 2022-12-22 – 2022-12-29 (×8): 250 mg via ORAL
  Filled 2022-12-22 (×8): qty 1

## 2022-12-22 NOTE — Progress Notes (Addendum)
PROGRESS NOTE   Subjective/Complaints: Continues to have right arm weakness and pain- nursing placed lidocaine patch on arm Discussed XR results show spondylosis, she is also in excruciating pain   ROS: Patient denies fever, headache, abdominal pain, diarrhea, motor or sensory changes, rash. +right sided weakness + chills resolved    objective:   DG Cervical Spine Complete  Result Date: 12/21/2022 CLINICAL DATA:  Right hand weakness for 2 days, finger numbness EXAM: CERVICAL SPINE - COMPLETE 4+ VIEW COMPARISON:  03/08/2011 FINDINGS: Frontal, bilateral oblique, lateral views of the cervical spine are obtained. Alignment is anatomic to the cervicothoracic junction on the lateral projection. There is moderate spondylosis at C4-5, C5-6, and C6-7. Mild diffuse facet hypertrophy. Evaluation of the inferior right neural foramina are limited by patient positioning. Otherwise the neural foramina appear patent. No evidence of acute fracture. Prevertebral soft tissues are unremarkable. IMPRESSION: 1. Multilevel cervical spondylosis and facet hypertrophy. 2. No acute bony abnormalities. Electronically Signed   By: Sharlet Salina M.D.   On: 12/21/2022 21:29   DG Shoulder Right  Result Date: 12/20/2022 CLINICAL DATA:  Pain EXAM: RIGHT SHOULDER - 2+ VIEW COMPARISON:  Right shoulder x-ray 07/26/2010 FINDINGS: There is no evidence of fracture or dislocation. There is no evidence of arthropathy or other focal bone abnormality. Soft tissues are unremarkable. IMPRESSION: Negative. Electronically Signed   By: Darliss Cheney M.D.   On: 12/20/2022 17:07   Recent Labs    12/21/22 0735 12/22/22 0519  WBC 9.2 11.3*  HGB 11.5* 11.8*  HCT 35.0* 35.5*  PLT 272 302   Recent Labs    12/20/22 0604  NA 135  K 3.9  CL 96*  CO2 26  GLUCOSE 128*  BUN 23*  CREATININE 1.06*  CALCIUM 9.0    Intake/Output Summary (Last 24 hours) at 12/22/2022 1610 Last  data filed at 12/21/2022 1830 Gross per 24 hour  Intake 720 ml  Output --  Net 720 ml         Physical Exam: Vital Signs Blood pressure 117/83, pulse (!) 101, temperature 98.8 F (37.1 C), resp. rate 19, height 5\' 4"  (1.626 m), weight (!) 153.8 kg, SpO2 94%.    General:  No apparent distress, sitting in on edge of bed, obese HEENT: NCAT, MMM  Tenderness noted at bilateral TMJ, pain with jaw movements Heart: Tachycardic, wearing life vest  Chest: CTAB abdomen: Soft, non-tender, non-distended, bowel sounds positive. Extremities: large [name] wound covered in dressings with areas of serosanguineous drainage noted Psych: Cooperative, a little flat Skin: Clean and intact without signs of breakdown Neuro:  Alert and oriented x3, follows commands, cranial nerves II through XII intact, no speech or language deficits noted, + memory deficits Sensation intact light touch in all 4 extremities Poor safety awareness.   Strength 4+ out of 5 in left upper extremity Strength 4- out of 5 right upper extremity-limited by pain, but only 3/5 in right hand grip Strength 4+ out of 5 right lower extremity Strength 2-3 out of 5 left lower extremity, exam stable 10/27 Musculoskeletal: No joint swelling noted.  Diffuse tenderness throughout right upper extremity and pain with range of motion at her shoulder, elbow, wrist.  Pain with passive range of motion of the right shoulder.   Assessment/Plan: 1. Functional deficits which require 3+ hours per day of interdisciplinary therapy in a comprehensive inpatient rehab setting. Physiatrist is providing close team supervision and 24 hour management of active medical problems listed below. Physiatrist and rehab team continue to assess barriers to discharge/monitor patient progress toward functional and medical goals  Care Tool:  Bathing    Body parts bathed by patient: Right arm, Left arm, Chest, Abdomen, Front perineal area, Face   Body parts bathed by  helper: Buttocks, Right lower leg, Right upper leg, Left upper leg Body parts n/a: Left lower leg   Bathing assist Assist Level: Moderate Assistance - Patient 50 - 74%     Upper Body Dressing/Undressing Upper body dressing   What is the patient wearing?: Hospital gown only    Upper body assist Assist Level: Minimal Assistance - Patient > 75%    Lower Body Dressing/Undressing Lower body dressing      What is the patient wearing?: Hospital gown only     Lower body assist Assist for lower body dressing: Minimal Assistance - Patient > 75%     Toileting Toileting    Toileting assist Assist for toileting: Moderate Assistance - Patient 50 - 74%     Transfers Chair/bed transfer  Transfers assist     Chair/bed transfer assist level: Contact Guard/Touching assist     Locomotion Ambulation   Ambulation assist      Assist level: Contact Guard/Touching assist Assistive device: Rollator Max distance: 100   Walk 10 feet activity   Assist     Assist level: Contact Guard/Touching assist Assistive device: Rollator   Walk 50 feet activity   Assist    Assist level: Contact Guard/Touching assist Assistive device: Rollator    Walk 150 feet activity   Assist Walk 150 feet activity did not occur: Safety/medical concerns         Walk 10 feet on uneven surface  activity   Assist Walk 10 feet on uneven surfaces activity did not occur: Safety/medical concerns   Assist level: Contact Guard/Touching assist (ramp.) Assistive device: Rollator   Wheelchair     Assist Is the patient using a wheelchair?: Yes Type of Wheelchair: Manual    Wheelchair assist level: Dependent - Patient 0%      Wheelchair 50 feet with 2 turns activity    Assist        Assist Level: Dependent - Patient 0%   Wheelchair 150 feet activity     Assist      Assist Level: Dependent - Patient 0%   Blood pressure 117/83, pulse (!) 101, temperature 98.8 F (37.1  C), resp. rate 19, height 5\' 4"  (1.626 m), weight (!) 153.8 kg, SpO2 94%.  Medical Problem List and Plan: 1. 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 Continue life vest              -patient may shower             -ELOS/Goals: 10-14 day,  PT/OT supervision to mod I, SLP mod I              -Continue CIR  -Discussed safety with pt, call nurses to get up. 2.  Antithrombotics: -DVT/anticoagulation:  Pharmaceutical: Lovenox             -antiplatelet therapy: N/A 3. Pain Management:  Oxycodone prn for pain control.   -10/24 increase gabapentin to 200  mg 3 times daily, start lidocaine patch for chest wall  -10/25 increase gabapentin to 300 mg 3 times daily 4. Mood/Behavior/Sleep: Team to provide ego support. LCSW to follow for evaluation and support.              --Melatonin prn for insomnia.              -antipsychotic agents: N/A at this time              --Neuropsych for coping skills.  5. Neuropsych/cognition: This patient is likely capable of making decisions on her own behalf. 6. Severe left lower extremity wound: wound examined with nursing and provided patient education: Added proteiin supplements and Vitamin C to promote wound healing             --BID dressing changes with silvadene per plastics 7. Fluids/Electrolytes/Nutrition: Strict I/O. Low salt diet. Juven and prostat added             -- Recheck CMET tomorrow 8. VT/VF arrest: Life Vest with plans for ICD once leg wound resolves?  9. NICM w/ HFrEF:  GDMT being resumed by Cards--on Aldactone, Lasix, Cozaar, Farxiga and Lipitor             --Daily wts and monitor for signs of overload. Strict I/O   -10/25 will asked nursing to recheck her weight today, increase in weight from 10/23 does not appear realistic   Filed Weights   12/20/22 0305 12/21/22 0500 12/22/22 0700  Weight: (!) 151 kg (!) 148.8 kg (!) 153.8 kg    10. [name] wound with necrosis: Plastic consulted 10/22-->now on silvadene  bid for local care --supplements added to promote wound healing.  11.  T2DM: Hgb A1c-  Was pm metformin/jardiance combo and Monjuaro PTA             --monitor BS ac/hs. AKI has resolved-->resume low dose metformin and titrate as indicated  -10/25 CBGs well-controlled, continue to monitor  CBG (last 3)  Recent Labs    12/21/22 1651 12/21/22 2108 12/22/22 0629  GLUCAP 120* 123* 124*    12.  Acute renal failure vs CKD IIIa: Resolving   -BUN a little higher at 23,creatinine down to 1.06.  Overall stable continue to monitor. D/c losartan 13. H/o severe recurrent depression w/anxiety: Vraylar and Lexapro discontinued due to concerns of QT prolongation             --Is being followed by behavioral health/Dr. Elsie Saas. Had [facility] care visit 09/25/22 --continue Xanax prn   14. OSA: Continue CPAP 15. H/o Lumbar spinal stenosis and  R-knee pain:  Resume gabapentin (mood and pain). She was going to outpatient therapy.   16. Morbid Obesity BMI-50.5: Multifactorial. Encourage/educate on life style and diet changes to help with wt loss and improve overall activity and health. .  17. ADD: Discussed need for activating medication (mood and energy per char review)--has ritalin on home meds but just approved  --Continue Ritalin added per secure chat with Dr. Bjorn Pippin.  18. Vaginal discomfort: Likely due to candidiasis --miconazole added for treatment.  19.  Hyperlipidemia: continue statin  20.  Right upper extremity pain/right shoulder pain  -Will check x-ray of her right shoulder as this seems to be the greatest location of pain at this time.  Suspect musculoskeletal pain due to increased activity.  21.  Bilateral jaw pain.   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  She has pain with movement and tenderness to palpation of her jaw.  Continue voltaren gel  22.  Chills reported yesterday.  Reports this has resolved.  She has been afebrile  -Will recheck CBC  23. Hypotension  D/c losartan  24. Right arm  weakness/pain: -cervical spin XR ordered and discussed that it shows multilevel spondylosis anf facet arthropathy. Attempted to order MRI cervical spine on Saturday but she is unable to have LifeVest off for more than 10 minutes, discussed steroids to reduced swelling in neck but she defers due to risks of worsening infection and weight gain, discussed increase in gabapentin or topamax and she chooses to try topamax, discussed that right arm symptoms improved temporarily with therapy, encouraged continued physical therapy to decompress spine, discussed that putting a lot of pressure while holding the walker could contribute.     LOS: 5 days A FACE TO FACE EVALUATION WAS PERFORMED  Drema Pry Jeffrie Lofstrom 12/22/2022, 8:23 AM

## 2022-12-22 NOTE — Progress Notes (Signed)
Progress Note  Patient Name: [name] Date of Encounter: 12/22/2022  Primary Cardiologist:   [name], MD   Subjective   BP 117/83 Reports chest pain with inspiration.  Denies any dyspnea. Wearing lifevest  Inpatient Medications    Scheduled Meds:  ascorbic acid  500 mg Oral Daily   atorvastatin  80 mg Oral Daily   clotrimazole  1 Applicatorful Vaginal QHS   dapagliflozin propanediol  10 mg Oral Daily   diclofenac Sodium  2 g Topical QID   enoxaparin (LOVENOX) injection  0.5 mg/kg Subcutaneous Q24H   furosemide  40 mg Oral Daily   gabapentin  300 mg Oral TID   insulin aspart  0-5 Units Subcutaneous QHS   insulin aspart  0-9 Units Subcutaneous TID WC   lidocaine  2 patch Transdermal Daily   metFORMIN  250 mg Oral BID WC   methylphenidate  5 mg Oral BID WC   metoprolol succinate  50 mg Oral Daily   nutrition supplement (JUVEN)  1 packet Oral BID BM   protein supplement  1 Scoop Oral TID WC   silver sulfADIAZINE   Topical BID   spironolactone  12.5 mg Oral Daily   topiramate  25 mg Oral BID   Continuous Infusions:   PRN Meds: acetaminophen, ALPRAZolam, alum & mag hydroxide-simeth, bisacodyl, diphenhydrAMINE, guaiFENesin-dextromethorphan, hydrOXYzine, mouth rinse, oxyCODONE, polyethylene glycol, prochlorperazine **OR** prochlorperazine **OR** prochlorperazine, sodium phosphate, traZODone   Vital Signs    Vitals:   12/21/22 1249 12/21/22 1940 12/22/22 0334 12/22/22 0700  BP: 99/77 119/85 117/83   Pulse: 91 99 (!) 101   Resp: 16 19 19    Temp: 98 F (36.7 C) 97.8 F (36.6 C) 98.8 F (37.1 C)   TempSrc: Oral     SpO2: 94% 97% 94%   Weight:    (!) 153.8 kg  Height:        Intake/Output Summary (Last 24 hours) at 12/22/2022 1038 Last data filed at 12/21/2022 1830 Gross per 24 hour  Intake 720 ml  Output --  Net 720 ml   Filed Weights   12/20/22 0305 12/21/22 0500 12/22/22 0700  Weight: (!) 151 kg (!) 148.8 kg (!) 153.8 kg    Telemetry  Not  on telemetry  Personally Reviewed  ECG    NA - Personally Reviewed  Physical Exam   GEN:     Anxious no acute distress.   Neck: No JVD Cardiac: RRR, no murmurs, rubs, or gallops.  Respiratory: Clear   to auscultation bilaterally. GI: Soft, nontender, non-distended, normal bowel sounds  MS:  No edema; bandage over left leg Neuro:   Nonfocal  Psych: Oriented and appropriate    Labs    Chemistry Recent Labs  Lab 12/17/22 0420 12/18/22 0500 12/20/22 0604  NA 137 136 135  K 3.8 3.5 3.9  CL 99 99 96*  CO2 28 30 26   GLUCOSE 119* 125* 128*  BUN 17 18 23*  CREATININE 1.01* 1.15* 1.06*  CALCIUM 8.8* 8.8* 9.0  PROT  --  6.1*  --   ALBUMIN  --  2.8*  --   AST  --  23  --   ALT  --  26  --   ALKPHOS  --  42  --   BILITOT  --  0.9  --   GFRNONAA >60 56* >60  ANIONGAP 10 7 13      Hematology Recent Labs  Lab 12/20/22 1446 12/21/22 0735 12/22/22 0519  WBC 14.4* 9.2 11.3*  RBC 4.73 4.07 4.11  HGB 13.6 11.5* 11.8*  HCT 40.3 35.0* 35.5*  MCV 85.2 86.0 86.4  MCH 28.8 28.3 28.7  MCHC 33.7 32.9 33.2  RDW 14.6 14.7 14.5  PLT 293 272 302    Cardiac EnzymesNo results for input(s): "TROPONINI" in the last 168 hours. No results for input(s): "TROPIPOC" in the last 168 hours.   BNPNo results for input(s): "BNP", "PROBNP" in the last 168 hours.   DDimer No results for input(s): "DDIMER" in the last 168 hours.   Radiology    DG Cervical Spine Complete  Result Date: 12/21/2022 CLINICAL DATA:  Right hand weakness for 2 days, finger numbness EXAM: CERVICAL SPINE - COMPLETE 4+ VIEW COMPARISON:  03/08/2011 FINDINGS: Frontal, bilateral oblique, lateral views of the cervical spine are obtained. Alignment is anatomic to the cervicothoracic junction on the lateral projection. There is moderate spondylosis at C4-5, C5-6, and C6-7. Mild diffuse facet hypertrophy. Evaluation of the inferior right neural foramina are limited by patient positioning. Otherwise the neural foramina appear  patent. No evidence of acute fracture. Prevertebral soft tissues are unremarkable. IMPRESSION: 1. Multilevel cervical spondylosis and facet hypertrophy. 2. No acute bony abnormalities. Electronically Signed   By: Sharlet Salina M.D.   On: 12/21/2022 21:29   DG Shoulder Right  Result Date: 12/20/2022 CLINICAL DATA:  Pain EXAM: RIGHT SHOULDER - 2+ VIEW COMPARISON:  Right shoulder x-ray 07/26/2010 FINDINGS: There is no evidence of fracture or dislocation. There is no evidence of arthropathy or other focal bone abnormality. Soft tissues are unremarkable. IMPRESSION: Negative. Electronically Signed   By: Darliss Cheney M.D.   On: 12/20/2022 17:07    Cardiac Studies   Cath above:  NL coronaries.   Right heart pressures OK.    Patient Profile     57 y.o. 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  Echocardiogram 12/07/2022 showed EF 30 to 35%, normal RV function.  Cath 12/09/2022 showed normal coronary arteries, RA 8, RV 47/6, PA 46/21/31, PCWP 19, CI 2.8.  Cardiac MRI 12/12/2022 showed LVEF 31%, RVEF 50%, RV insertion site LGE. -Continue Lasix 40 mg daily -Continue losartan,dose reduced to 12.5 mg daily given soft BP. Continue Toprol-XL 50 mg daily, spironolactone 12.5 mg daily, Farxiga 10 mg daily.  Previously was on entresto and coreg but reports would not take her evening doses of any medications; regimen simplified to once daily dosing -Check BMET tomorrow  VF arrest: EP consulted, planning ICD once leg wound heals.  Recommend LifeVest in the meantime.  She has been wearing lifevest  Hyperlipidemia: On atorvastatin 80 mg daily  T2DM: On SSI, Farxiga  Leg wound: plastic surgery consulted, may ultimately need surgery   [facility] will sign off.   Medication  Recommendations: Lasix 40 mg daily, losartan 12.5 mg daily, Toprol XL 50 mg daily, spironolactone 12.5 mg daily, Farxiga 10 mg daily Other recommendations (labs, testing, etc): None Follow up as an outpatient: Appointment with general cardiology scheduled 11/26.  Appointment with EP in 11/20   For questions or updates, please contact [facility] Please consult [email or web address] for contact info under Cardiology/STEMI.   Signed, Little Ishikawa, MD  12/22/2022, 10:38 AM

## 2022-12-22 NOTE — Progress Notes (Signed)
Occupational Therapy Session Note  Patient Details  Name: [name] MRN: [id number] Date of Birth: [birth year]  {CHL IP REHAB OT TIME CALCULATIONS:304400400}  {CHL IP REHAB OT TIME CALCULATIONS:304400400}  Short Term Goals: Week 1:  OT Short Term Goal 1 (Week 1): STGs=LTGs due to patient's length of stay.  Skilled Therapeutic Interventions/Progress Updates:   Session 1: Pt received *** for skilled OT session with focus on ***. Pt agreeable to interventions, demonstrating overall *** mood. Pt reported ***/10 pain, stating "***" in reference to ***. OT offering intermediate rest breaks and positioning suggestions throughout session to address pain/fatigue and maximize participation/safety in session.    Pt remained *** with all immediate needs met at end of session. Pt continues to be appropriate for skilled OT intervention to promote further functional independence.   Session 2: Pt received *** for skilled OT session with focus on ***. Pt agreeable to interventions, demonstrating overall *** mood. Pt reported ***/10 pain, stating "***" in reference to ***. OT offering intermediate rest breaks and positioning suggestions throughout session to address pain/fatigue and maximize participation/safety in session.    Pt remained *** with all immediate needs met at end of session. Pt continues to be appropriate for skilled OT intervention to promote further functional independence.    Therapy Documentation Precautions:  Precautions Precautions: Fall Precaution Comments: Life Vest, Falls, Chronic low back and R-knee pain Restrictions Weight Bearing Restrictions: No RUE Weight Bearing: Weight bearing as tolerated   Therapy/Group: Individual Therapy  Lou Cal, OTR/L, MSOT  12/22/2022, 2:59 PM

## 2022-12-22 NOTE — Progress Notes (Signed)
   12/22/22 0000  BiPAP/CPAP/SIPAP  Reason BIPAP/CPAP not in use Non-compliant (Pt refused)

## 2022-12-23 ENCOUNTER — Encounter (HOSPITAL_BASED_OUTPATIENT_CLINIC_OR_DEPARTMENT_OTHER): Payer: Medicare Other

## 2022-12-23 DIAGNOSIS — R682 Dry mouth, unspecified: Secondary | ICD-10-CM

## 2022-12-23 DIAGNOSIS — I959 Hypotension, unspecified: Secondary | ICD-10-CM | POA: Diagnosis not present

## 2022-12-23 DIAGNOSIS — K5900 Constipation, unspecified: Secondary | ICD-10-CM

## 2022-12-23 DIAGNOSIS — R5381 Other malaise: Secondary | ICD-10-CM | POA: Diagnosis not present

## 2022-12-23 DIAGNOSIS — I5022 Chronic systolic (congestive) heart failure: Secondary | ICD-10-CM | POA: Diagnosis not present

## 2022-12-23 DIAGNOSIS — E1169 Type 2 diabetes mellitus with other specified complication: Secondary | ICD-10-CM | POA: Diagnosis not present

## 2022-12-23 LAB — BASIC METABOLIC PANEL
Anion gap: 11 (ref 5–15)
BUN: 25 mg/dL — ABNORMAL HIGH (ref 6–20)
CO2: 25 mmol/L (ref 22–32)
Calcium: 8.7 mg/dL — ABNORMAL LOW (ref 8.9–10.3)
Chloride: 100 mmol/L (ref 98–111)
Creatinine, Ser: 0.88 mg/dL (ref 0.44–1.00)
GFR, Estimated: >60 mL/min (ref >60)
Glucose, Bld: 110 mg/dL — ABNORMAL HIGH (ref 70–99)
Potassium: 3.7 mmol/L (ref 3.5–5.1)
Sodium: 136 mmol/L (ref 135–145)

## 2022-12-23 LAB — CBC
HCT: 35.4 % — ABNORMAL LOW (ref 36.0–46.0)
Hemoglobin: 11.7 g/dL — ABNORMAL LOW (ref 12.0–15.0)
MCH: 28.7 pg (ref 26.0–34.0)
MCHC: 33.1 g/dL (ref 30.0–36.0)
MCV: 86.8 fL (ref 80.0–100.0)
Platelets: 291 10*3/uL (ref 150–400)
RBC: 4.08 MIL/uL (ref 3.87–5.11)
RDW: 14.6 % (ref 11.5–15.5)
WBC: 8.8 10*3/uL (ref 4.0–10.5)
nRBC: 0 % (ref 0.0–0.2)

## 2022-12-23 LAB — GLUCOSE, CAPILLARY
Glucose-Capillary: 109 mg/dL — ABNORMAL HIGH (ref 70–99)
Glucose-Capillary: 129 mg/dL — ABNORMAL HIGH (ref 70–99)
Glucose-Capillary: 115 mg/dL — ABNORMAL HIGH (ref 70–99)
Glucose-Capillary: 125 mg/dL — ABNORMAL HIGH (ref 70–99)

## 2022-12-23 MED ORDER — SORBITOL 70 % SOLN
45.0000 mL | Freq: Once | Status: AC
  Administered 2022-12-23: 45 mL via ORAL
  Filled 2022-12-23: qty 60

## 2022-12-23 MED ORDER — TOPIRAMATE 25 MG PO TABS
25.0000 mg | ORAL_TABLET | Freq: Every day | ORAL | Status: DC
  Administered 2022-12-24 – 2022-12-29 (×6): 25 mg via ORAL
  Filled 2022-12-23 (×6): qty 1

## 2022-12-23 MED ORDER — BIOTENE DRY MOUTH MT LIQD: 15.0000 mL | OROMUCOSAL | Status: DC | PRN

## 2022-12-23 NOTE — Progress Notes (Signed)
Pt requested that house tray's no longer be sent to patient's room via nutrition services. Pt has verbalized that she does not any automatic meals coming and understands that unless she calls Nutrition services herself, she will call in her food moving forward and will not receive any food unless she calls down or requests from nursing staff to call for her. Patient verbalizes understanding. Nutrition Host notified.

## 2022-12-23 NOTE — Progress Notes (Signed)
Patient declined the use of CPAP for the night  

## 2022-12-23 NOTE — Progress Notes (Signed)
Physical Therapy Session Note  Patient Details  Name: [name] MRN: [id number] Date of Birth: [birth year]   Today's Date: 12/23/2022 PT Individual Time: 0900-0957 PT Individual Time Calculation (min): 57 min   Short Term Goals: Week 1:  PT Short Term Goal 1 (Week 1): pt will ambulate 150 ft CGA PT Short Term Goal 2 (Week 1): pt will perform STS w/ supervision PT Short Term Goal 3 (Week 1): pt will perform bed mobility w/ minA PT Short Term Goal 4 (Week 1): pt will ambulate 8 stairs CGA PT Short Term Goal 5 (Week 1): pt will perform bed to chair transfer supervision  Skilled Therapeutic Interventions/Progress Updates:      Pt seated in WC brushing teeth upon arrival. Pt agreeable to therapy. Pt reports 7/10 pain in R shoulder. Pt donned deodarant with A from husband. Pt brushed hair while seated in WC with set up A.   Discussed equipment for home: pt reports she has a Museum/gallery exhibitions officer at home but unable to use it inside of the house as her halls are too narrow. Discussed home measurement sheet, pt reports she knows a Rollator will not fit. Pt reports she previously held onto walls, furniture or had assist from her husband.  Discussed pt stairs at home, pt reports curb step from parking lot to sidewalk (pt previously held onto car), and 1 step entry into house with B HR  (however pt handrails not stable). Pt has ~12-15 steps inside of house to get to shower, with 8 steps to landing and ~5 steps to 2nd landing. Recommending pt place a chair on landing for seated rest break as needed for fatigue.   Pt ambulated x~120 feet  with no AD and CGA, verbal cues provided for reciprocal gait. Pt ascended 8 3 inch steps with B HR and CGA with reciprocal gait, and descended with step to gait and CGA.   Pt ascended 2x4 4 6 inch steps with B HR and step to gait, 1x8 6 inch steps with unilateral handrail, and step to gait leading with R UE for ascending and L LE for descending with CGA, verbal cues provided for  technique.   Vitals assessed throughout RA O2 90-96, HR 101-107   Education provided on plan to continue to work on ambulation without an AD to progress strength/balance and independence. Will determine most appropriate AD closer to D/C. Pt verbalized understanding.   Pt seated in [facility] with all needs within reach and seatbelt alarm on.     Therapy Documentation Precautions:  Precautions Precautions: Fall Precaution Comments: Life Vest, Falls, Chronic low back and R-knee pain Restrictions Weight Bearing Restrictions: No RUE Weight Bearing: Weight bearing as tolerated  Therapy/Group: Individual Therapy  Ambrose Finland 12/23/2022, 7:32 AM

## 2022-12-23 NOTE — Progress Notes (Signed)
Speech Language Pathology Daily Session Note  Patient Details  Name: [name] MRN: [id number] Date of Birth: 1965/12/18  Today's Date: 12/23/2022 SLP Individual Time: 1005-1045 SLP Individual Time Calculation (min): 40 min  Short Term Goals: Week 1: SLP Short Term Goal 1 (Week 1): Patient will complete moderately complex problem solving with 90% acc with sup A SLP Short Term Goal 2 (Week 1): Patient will recall novel information given a 10- 15 minute delay with 90% acc given sup A SLP Short Term Goal 3 (Week 1): Patient will demonstrate sustained attention during functional tasks for >10 minutes given min multimodal A  Skilled Therapeutic Interventions: SLP conducted skilled therapy session targeting cognitive retraining goals. Patient highly tangential throughout session, perseverative on ADD and difficult to redirect. Despite difficulty attending to therapy session, patient very insightful to deficits and appears to have compensatory strategies in place at baseline. During discussion of current cognitive deficits, patient reports that she does think her memory has changed slightly compared to prior level of function. Patient indicates that at previous session, mindfulness meditation was helpful in aiding her with calming her mind and attending to information provided in the medical setting, but reports that she cannot remember how to get back to these guided meditations. SLP provided patient with handout outlining how to access guided meditations via Youtube. Other strategies that patient finds helpful at baseline include writing things down, using phone alarms, and recording important conversations nad listening back later. SLP encouraged patient to continue to use these strategies. Patient was left in chair with call bell in reach and chair alarm set. SLP will continue to target goals per plan of care.       Pain Pain Assessment Pain Scale: 0-10 Pain Score: 0-No pain  Therapy/Group:  Individual Therapy  Jeannie Done, M.A., CCC-SLP  Yetta Barre 12/23/2022, 10:19 AM

## 2022-12-23 NOTE — Plan of Care (Signed)
  Problem: Consults Goal: RH STROKE PATIENT EDUCATION Description: See Patient Education module for education specifics  Outcome: Progressing   Problem: RH SKIN INTEGRITY Goal: RH STG SKIN FREE OF INFECTION/BREAKDOWN Description: Manage w min assist Outcome: Progressing Goal: RH STG MAINTAIN SKIN INTEGRITY WITH ASSISTANCE Description: STG Maintain Skin Integrity With min Assistance. Outcome: Progressing Goal: RH STG ABLE TO PERFORM INCISION/WOUND CARE W/ASSISTANCE Description: STG Able To Perform Incision/Wound Care With min Assistance. Outcome: Progressing   Problem: RH SAFETY Goal: RH STG ADHERE TO SAFETY PRECAUTIONS W/ASSISTANCE/DEVICE Description: STG Adhere to Safety Precautions With cues Assistance/Device. Outcome: Progressing   Problem: RH PAIN MANAGEMENT Goal: RH STG PAIN MANAGED AT OR BELOW PT'S PAIN GOAL Description: < 4 with prns Outcome: Progressing   Problem: RH KNOWLEDGE DEFICIT Goal: RH STG INCREASE KNOWLEDGE OF DIABETES Description: Patient and spouse will be able to manage DM with medications and dietary modification using educational resources independently Outcome: Progressing Goal: RH STG INCREASE KNOWLEDGE OF HYPERTENSION Description: Patient and spouse will be able to manage HTN with medications and dietary modification using educational resources independently Outcome: Progressing Goal: RH STG INCREASE KNOWLEGDE OF HYPERLIPIDEMIA Description: Patient and spouse will be able to manage HLD with medications and dietary modification using educational resources independently Outcome: Progressing   Problem: Education: Goal: Ability to demonstrate management of disease process will improve Description: Patient and spouse will be able to manage HF with medications and dietary modification using educational resources independently Outcome: Progressing Goal: Individualized Educational Video(s) Outcome: Progressing   Problem: Education: Goal: Ability to  describe self-care measures that may prevent or decrease complications (Diabetes Survival Skills Education) will improve Outcome: Progressing Goal: Individualized Educational Video(s) Outcome: Progressing   Problem: Fluid Volume: Goal: Ability to maintain a balanced intake and output will improve Outcome: Progressing   Problem: Health Behavior/Discharge Planning: Goal: Ability to identify and utilize available resources and services will improve Outcome: Progressing Goal: Ability to manage health-related needs will improve Outcome: Progressing   Problem: Metabolic: Goal: Ability to maintain appropriate glucose levels will improve Outcome: Progressing   Problem: Nutritional: Goal: Maintenance of adequate nutrition will improve Outcome: Progressing Goal: Progress toward achieving an optimal weight will improve Outcome: Progressing   Problem: Skin Integrity: Goal: Risk for impaired skin integrity will decrease Outcome: Progressing   Problem: Tissue Perfusion: Goal: Adequacy of tissue perfusion will improve Outcome: Progressing

## 2022-12-23 NOTE — Progress Notes (Addendum)
PROGRESS NOTE   Subjective/Complaints: Patient reports that her pain and strength in her right upper extremity is a little improved today from prior days.  She does report dry mouth.   ROS: Patient denies fever, headache, abdominal pain, diarrhea, motor or sensory changes, rash. +right sided weakness + chills resolved + Constipation    objective:   DG Cervical Spine Complete  Result Date: 12/21/2022 CLINICAL DATA:  Right hand weakness for 2 days, finger numbness EXAM: CERVICAL SPINE - COMPLETE 4+ VIEW COMPARISON:  03/08/2011 FINDINGS: Frontal, bilateral oblique, lateral views of the cervical spine are obtained. Alignment is anatomic to the cervicothoracic junction on the lateral projection. There is moderate spondylosis at C4-5, C5-6, and C6-7. Mild diffuse facet hypertrophy. Evaluation of the inferior right neural foramina are limited by patient positioning. Otherwise the neural foramina appear patent. No evidence of acute fracture. Prevertebral soft tissues are unremarkable. IMPRESSION: 1. Multilevel cervical spondylosis and facet hypertrophy. 2. No acute bony abnormalities. Electronically Signed   By: Sharlet Salina M.D.   On: 12/21/2022 21:29   Recent Labs    12/22/22 0519 12/23/22 0723  WBC 11.3* 8.8  HGB 11.8* 11.7*  HCT 35.5* 35.4*  PLT 302 291   Recent Labs    12/23/22 0723  NA 136  K 3.7  CL 100  CO2 25  GLUCOSE 110*  BUN 25*  CREATININE 0.88  CALCIUM 8.7*    Intake/Output Summary (Last 24 hours) at 12/23/2022 1323 Last data filed at 12/23/2022 1200 Gross per 24 hour  Intake 396 ml  Output --  Net 396 ml         Physical Exam: Vital Signs Blood pressure (!) 113/54, pulse 92, temperature 98.1 F (36.7 C), temperature source Oral, resp. rate 18, height 5\' 4"  (1.626 m), weight (!) 153.9 kg, SpO2 90%.    General:  No apparent distress, sitting in on edge of bed, obese HEENT: NCAT, MMM  Tenderness  noted at bilateral TMJ, pain with jaw movements Heart: Tachycardic, wearing life vest  Chest: CTAB abdomen: Soft, non-tender, non-distended, bowel sounds positive. Extremities: large [name] wound covered in dry dressing Psych: Cooperative, a little flat Skin: Clean and intact without signs of breakdown Neuro:  Alert and oriented x3, follows commands, cranial nerves II through XII intact, no speech or language deficits noted, + memory deficits Sensation intact light touch in all 4 extremities Poor safety awareness.   Strength 4+ out of 5 in left upper extremity Strength 4- out of 5 right upper extremity-limited by pain, but only 3/5 in right hand grip Strength 4+ out of 5 right lower extremity Strength 2-3 out of 5 left lower extremity, exam stable 10/27 Musculoskeletal: No joint swelling noted.  Diffuse tenderness throughout right upper extremity and pain with range of motion at her shoulder, elbow, wrist.  Pain with passive range of motion of the right shoulder.   Assessment/Plan: 1. Functional deficits which require 3+ hours per day of interdisciplinary therapy in a comprehensive inpatient rehab setting. Physiatrist is providing close team supervision and 24 hour management of active medical problems listed below. Physiatrist and rehab team continue to assess barriers to discharge/monitor patient progress toward functional and medical  goals  Care Tool:  Bathing    Body parts bathed by patient: Right arm, Left arm, Chest, Abdomen, Front perineal area, Face   Body parts bathed by helper: Buttocks, Right lower leg, Right upper leg, Left upper leg Body parts n/a: Left lower leg   Bathing assist Assist Level: Moderate Assistance - Patient 50 - 74%     Upper Body Dressing/Undressing Upper body dressing   What is the patient wearing?: Hospital gown only    Upper body assist Assist Level: Minimal Assistance - Patient > 75%    Lower Body Dressing/Undressing Lower body dressing       What is the patient wearing?: Hospital gown only     Lower body assist Assist for lower body dressing: Minimal Assistance - Patient > 75%     Toileting Toileting    Toileting assist Assist for toileting: Moderate Assistance - Patient 50 - 74%     Transfers Chair/bed transfer  Transfers assist     Chair/bed transfer assist level: Contact Guard/Touching assist     Locomotion Ambulation   Ambulation assist      Assist level: Contact Guard/Touching assist Assistive device: Rollator Max distance: 100   Walk 10 feet activity   Assist     Assist level: Contact Guard/Touching assist Assistive device: Rollator   Walk 50 feet activity   Assist    Assist level: Contact Guard/Touching assist Assistive device: Rollator    Walk 150 feet activity   Assist Walk 150 feet activity did not occur: Safety/medical concerns         Walk 10 feet on uneven surface  activity   Assist Walk 10 feet on uneven surfaces activity did not occur: Safety/medical concerns   Assist level: Contact Guard/Touching assist (ramp.) Assistive device: Rollator   Wheelchair     Assist Is the patient using a wheelchair?: Yes Type of Wheelchair: Manual    Wheelchair assist level: Dependent - Patient 0%      Wheelchair 50 feet with 2 turns activity    Assist        Assist Level: Dependent - Patient 0%   Wheelchair 150 feet activity     Assist      Assist Level: Dependent - Patient 0%   Blood pressure (!) 113/54, pulse 92, temperature 98.1 F (36.7 C), temperature source Oral, resp. rate 18, height 5\' 4"  (1.626 m), weight (!) 153.9 kg, SpO2 90%.  Medical Problem List and Plan: 1. 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 Continue life vest              -patient may shower             -ELOS/Goals: 10-14 day,  PT/OT supervision to mod I, SLP mod I              -Continue CIR  -Discussed safety with pt, call nurses  to get up. 2.  Antithrombotics: -DVT/anticoagulation:  Pharmaceutical: Lovenox             -antiplatelet therapy: N/A 3. Pain Management:  Oxycodone prn for pain control.   -10/24 increase gabapentin to 200 mg 3 times daily, start lidocaine patch for chest wall  -10/25 increase gabapentin to 300 mg 3 times daily  -10/28 pain doing better today, continue current regimen 4. Mood/Behavior/Sleep: Team to provide ego support. LCSW to follow for evaluation and support.              --Melatonin prn for insomnia.              -  antipsychotic agents: N/A at this time              --Neuropsych for coping skills.  5. Neuropsych/cognition: This patient is likely capable of making decisions on her own behalf. 6. Severe left lower extremity wound: wound examined with nursing and provided patient education: Added proteiin supplements and Vitamin C to promote wound healing             --BID dressing changes with silvadene per plastics 7. Fluids/Electrolytes/Nutrition: Strict I/O. Low salt diet. Juven and prostat added             -- Recheck CMET tomorrow 8. VT/VF arrest: Life Vest with plans for ICD once leg wound resolves?  9. NICM w/ HFrEF:  GDMT being resumed by Cards--on Aldactone, Lasix, Cozaar, Farxiga and Lipitor             --Daily wts and monitor for signs of overload. Strict I/O   -10/25 will asked nursing to recheck her weight today, increase in weight from 10/23 does not appear realistic  -10/28 cardiology has signed off, follow-up appointment outpatient 11/26 and EPP 11/20   Filed Weights   12/21/22 0500 12/22/22 0700 12/23/22 0540  Weight: (!) 148.8 kg (!) 153.8 kg (!) 153.9 kg    10. [name] wound with necrosis: Plastic consulted 10/22-->now on silvadene bid for local care --supplements added to promote wound healing.  11.  T2DM: Hgb A1c-  Was pm metformin/jardiance combo and Monjuaro PTA             --monitor BS ac/hs. AKI has resolved-->resume low dose metformin and titrate as  indicated  -10/28 well-controlled  CBG (last 3)  Recent Labs    12/22/22 2145 12/23/22 0556 12/23/22 1203  GLUCAP 105* 109* 129*    12.  Acute renal failure vs CKD IIIa: Resolving   -BUN a little higher at 23,creatinine down to 1.06.  Overall stable continue to monitor.  Losartan dose was decreased to 12.5 mg daily 13. H/o severe recurrent depression w/anxiety: Vraylar and Lexapro discontinued due to concerns of QT prolongation             --Is being followed by behavioral health/Dr. Elsie Saas. Had [facility] care visit 09/25/22 --continue Xanax prn   14. OSA: Continue CPAP 15. H/o Lumbar spinal stenosis and  R-knee pain:  Resume gabapentin (mood and pain). She was going to outpatient therapy.   16. Morbid Obesity BMI-50.5: Multifactorial. Encourage/educate on life style and diet changes to help with wt loss and improve overall activity and health. .  17. ADD: Discussed need for activating medication (mood and energy per char review)--has ritalin on home meds but just approved  --Continue Ritalin added per secure chat with Dr. Bjorn Pippin.  18. Vaginal discomfort: Likely due to candidiasis --miconazole added for treatment.  19.  Hyperlipidemia: continue statin  20.  Right upper extremity pain/right shoulder pain  -Will check x-ray of her right shoulder as this seems to be the greatest location of pain at this time.  Suspect musculoskeletal pain due to increased activity.  21.  Bilateral jaw pain.   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  She has pain with movement and tenderness to palpation of her jaw.  Continue voltaren gel  22.  Chills reported yesterday.  Reports this has resolved.  She has been afebrile  -10/28 WBC within normal limits today  23. Hypotension  Losartan dose was decreased to 12.5 mg daily     12/23/2022    1:00 PM 12/23/2022  5:40 AM 12/23/2022    5:38 AM  Vitals with BMI  Weight  339 lbs 5 oz   BMI  58.21   Systolic 113  113  Diastolic 54  56  Pulse 92  85     24. Right arm  weakness/pain: -cervical spin XR ordered and discussed that it shows multilevel spondylosis anf facet arthropathy. Attempted to order MRI cervical spine on Saturday but she is unable to have LifeVest off for more than 10 minutes, discussed steroids to reduced swelling in neck but she defers due to risks of worsening infection and weight gain, discussed increase in gabapentin or topamax and she chooses to try topamax, discussed that right arm symptoms improved temporarily with therapy, encouraged continued physical therapy to decompress spine, discussed that putting a lot of pressure while holding the walker could contribute.  -10/20 improved today, continue to monitor   25. Dry Mouth  -10/28 Discussed with pharmacy, no recent medications that very likely to cause this, will start biotene  26.  Constipation.  Sorbitol ordered   LOS: 6 days A FACE TO FACE EVALUATION WAS PERFORMED  Fanny Dance 12/23/2022, 1:23 PM

## 2022-12-24 DIAGNOSIS — R5381 Other malaise: Secondary | ICD-10-CM | POA: Diagnosis not present

## 2022-12-24 DIAGNOSIS — S81802A Unspecified open wound, left lower leg, initial encounter: Secondary | ICD-10-CM | POA: Diagnosis not present

## 2022-12-24 DIAGNOSIS — I959 Hypotension, unspecified: Secondary | ICD-10-CM | POA: Diagnosis not present

## 2022-12-24 DIAGNOSIS — E1169 Type 2 diabetes mellitus with other specified complication: Secondary | ICD-10-CM | POA: Diagnosis not present

## 2022-12-24 DIAGNOSIS — I5022 Chronic systolic (congestive) heart failure: Secondary | ICD-10-CM | POA: Diagnosis not present

## 2022-12-24 LAB — GLUCOSE, CAPILLARY
Glucose-Capillary: 106 mg/dL — ABNORMAL HIGH (ref 70–99)
Glucose-Capillary: 138 mg/dL — ABNORMAL HIGH (ref 70–99)
Glucose-Capillary: 139 mg/dL — ABNORMAL HIGH (ref 70–99)
Glucose-Capillary: 97 mg/dL (ref 70–99)

## 2022-12-24 MED ORDER — POLYETHYLENE GLYCOL 3350 17 G PO PACK
17.0000 g | PACK | Freq: Every day | ORAL | Status: DC
  Administered 2022-12-24 – 2022-12-25 (×2): 17 g via ORAL
  Filled 2022-12-24 (×2): qty 1

## 2022-12-24 MED ORDER — TIRZEPATIDE 2.5 MG/0.5ML ~~LOC~~ SOAJ: 2.5000 mg | SUBCUTANEOUS | Status: DC

## 2022-12-24 MED ORDER — SORBITOL 70 % SOLN
60.0000 mL | Freq: Once | Status: AC
  Administered 2022-12-24: 60 mL via ORAL
  Filled 2022-12-24: qty 60

## 2022-12-24 MED ORDER — DOXYCYCLINE HYCLATE 100 MG PO TABS: 100.0000 mg | ORAL_TABLET | Freq: Two times a day (BID) | ORAL | Status: DC

## 2022-12-24 MED ORDER — CEPHALEXIN 250 MG PO CAPS
500.0000 mg | ORAL_CAPSULE | Freq: Three times a day (TID) | ORAL | Status: DC
  Administered 2022-12-24 – 2022-12-30 (×18): 500 mg via ORAL
  Filled 2022-12-24 (×18): qty 2

## 2022-12-24 NOTE — Progress Notes (Signed)
Occupational Therapy Session Note  Patient Details  Name: [name] MRN: [id number] Date of Birth: [birth year]  Today's Date: 12/24/2022 OT Individual Time: 1445-1530 OT Individual Time Calculation (min): 45 min    Short Term Goals: Week 1:  OT Short Term Goal 1 (Week 1): STGs=LTGs due to patient's length of stay.  Skilled Therapeutic Interventions/Progress Updates:   Pt seen for skilled OT. Pt required elevating leg rest on L LE for edema mngt and husband bedside for education with management. Pt transported to day room gym to and from for energy conservation for standing weight as requested by nursing and MD.  Pt able to use B hand hold on scale and step on and off with CGA for weight 299.9 lbs. Pt able to complete 1 lb biceps curls 2 sets of 12 reps seated. OT issued a light weight reacher and trained her and husband in use for items to avoid excess reaching.    Pain: L LE 4/10 with new elevating leg rest put in place   Therapy Documentation Precautions:  Precautions Precautions: Fall Precaution Comments: Life Vest, Falls, Chronic low back and R-knee pain Restrictions Weight Bearing Restrictions: No RUE Weight Bearing: Weight bearing as tolerated   Therapy/Group: Individual Therapy  [name] 12/24/2022, 7:50 AM

## 2022-12-24 NOTE — Progress Notes (Addendum)
PROGRESS NOTE   Subjective/Complaints: Right upper extremity strength and pain continues to improve!  She did bring her home [facility], discussed options we will hold for now as blood glucose doing well and she was not taking this regularly at home.     ROS: Patient denies fever, headache, abdominal pain, diarrhea, motor or sensory changes, rash. +right sided weakness + chills resolved + Constipation    objective:   No results found. Recent Labs    12/22/22 0519 12/23/22 0723  WBC 11.3* 8.8  HGB 11.8* 11.7*  HCT 35.5* 35.4*  PLT 302 291   Recent Labs    12/23/22 0723  NA 136  K 3.7  CL 100  CO2 25  GLUCOSE 110*  BUN 25*  CREATININE 0.88  CALCIUM 8.7*    Intake/Output Summary (Last 24 hours) at 12/24/2022 1109 Last data filed at 12/24/2022 0700 Gross per 24 hour  Intake 958 ml  Output --  Net 958 ml         Physical Exam: Vital Signs Blood pressure 103/65, pulse 85, temperature 98.1 F (36.7 C), resp. rate 18, height 5\' 4"  (1.626 m), weight (!) 155.3 kg, SpO2 95%.    General:  No apparent distress, sitting in on edge of bed, obese HEENT: NCAT, MMM  Tenderness noted at bilateral TMJ, pain with jaw movements Heart: Tachycardic, wearing life vest  Chest: CTAB abdomen: Soft, non-tender, non-distended, bowel sounds positive. Extremities: large [name] wound covered in dry dressing Psych: Cooperative, a little flat Skin: Clean and intact without signs of breakdown Neuro:  Alert and oriented x3, follows commands, cranial nerves II through XII intact, no speech or language deficits noted, + memory deficits Sensation intact light touch in all 4 extremities Poor safety awareness.   Strength 4+ out of 5 in left upper extremity Strength 4- out of 5 right upper extremity-limited by pain Strength 4+ out of 5 right lower extremity Strength 2-3 out of 5 left lower extremity, exam stable 10/27 Musculoskeletal: No  joint swelling noted.  Diffuse tenderness throughout right upper extremity improved today, she is lifting her arm to gravity at the shoulder without much difficulty today   Assessment/Plan: 1. Functional deficits which require 3+ hours per day of interdisciplinary therapy in a comprehensive inpatient rehab setting. Physiatrist is providing close team supervision and 24 hour management of active medical problems listed below. Physiatrist and rehab team continue to assess barriers to discharge/monitor patient progress toward functional and medical goals  Care Tool:  Bathing    Body parts bathed by patient: Right arm, Left arm, Chest, Abdomen, Front perineal area, Face   Body parts bathed by helper: Buttocks, Right lower leg, Right upper leg, Left upper leg Body parts n/a: Left lower leg   Bathing assist Assist Level: Moderate Assistance - Patient 50 - 74%     Upper Body Dressing/Undressing Upper body dressing   What is the patient wearing?: Hospital gown only    Upper body assist Assist Level: Minimal Assistance - Patient > 75%    Lower Body Dressing/Undressing Lower body dressing      What is the patient wearing?: Hospital gown only     Lower body assist  Assist for lower body dressing: Minimal Assistance - Patient > 75%     Toileting Toileting    Toileting assist Assist for toileting: Moderate Assistance - Patient 50 - 74%     Transfers Chair/bed transfer  Transfers assist     Chair/bed transfer assist level: Contact Guard/Touching assist     Locomotion Ambulation   Ambulation assist      Assist level: Contact Guard/Touching assist Assistive device: Rollator Max distance: 100   Walk 10 feet activity   Assist     Assist level: Contact Guard/Touching assist Assistive device: Rollator   Walk 50 feet activity   Assist    Assist level: Contact Guard/Touching assist Assistive device: Rollator    Walk 150 feet activity   Assist Walk 150 feet  activity did not occur: Safety/medical concerns         Walk 10 feet on uneven surface  activity   Assist Walk 10 feet on uneven surfaces activity did not occur: Safety/medical concerns   Assist level: Contact Guard/Touching assist (ramp.) Assistive device: Rollator   Wheelchair     Assist Is the patient using a wheelchair?: Yes Type of Wheelchair: Manual    Wheelchair assist level: Dependent - Patient 0%      Wheelchair 50 feet with 2 turns activity    Assist        Assist Level: Dependent - Patient 0%   Wheelchair 150 feet activity     Assist      Assist Level: Dependent - Patient 0%   Blood pressure 103/65, pulse 85, temperature 98.1 F (36.7 C), resp. rate 18, height 5\' 4"  (1.626 m), weight (!) 155.3 kg, SpO2 95%.  Medical Problem List and Plan: 1. 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 Continue life vest              -patient may shower             -ELOS/Goals: 10-14 day,  PT/OT supervision to mod I, SLP mod I              -Continue CIR  -Discussed safety with pt, call nurses to get up.  -Team conference tomorrow  2.  Antithrombotics: -DVT/anticoagulation:  Pharmaceutical: Lovenox             -antiplatelet therapy: N/A 3. Pain Management:  Oxycodone prn for pain control.   -10/24 increase gabapentin to 200 mg 3 times daily, start lidocaine patch for chest wall  -10/25 increase gabapentin to 300 mg 3 times daily  -10/29 Pain is improving, continue current regimen and monitor 4. Mood/Behavior/Sleep: Team to provide ego support. LCSW to follow for evaluation and support.              --Melatonin prn for insomnia.              -antipsychotic agents: N/A at this time              --Neuropsych for coping skills.  5. Neuropsych/cognition: This patient is likely capable of making decisions on her own behalf. 6. Severe left lower extremity wound: wound examined with nursing and provided patient  education: Added proteiin supplements and Vitamin C to promote wound healing             --BID dressing changes with silvadene per plastics 7. Fluids/Electrolytes/Nutrition: Strict I/O. Low salt diet. Juven and prostat added             -- Recheck CMET tomorrow  8. VT/VF arrest: Life Vest with plans for ICD once leg wound resolves?  9. NICM w/ HFrEF:  GDMT being resumed by Cards--on Aldactone, Lasix, Cozaar, Farxiga and Lipitor             --Daily wts and monitor for signs of overload. Strict I/O   -10/28 cardiology has signed off, follow-up appointment outpatient 11/26 and EPP 11/20  -10/29 will ask for standing weights as do not think current weights are accurate.  Her weight went from low 130s kilograms 10/23 and prior days to low 150 kg the following day   Filed Weights   12/22/22 0700 12/23/22 0540 12/24/22 0500  Weight: (!) 153.8 kg (!) 153.9 kg (!) 155.3 kg    10. [name] wound with necrosis: Plastic consulted 10/22-->now on silvadene bid for local care --supplements added to promote wound healing.  11.  T2DM: Hgb A1c-  Was pm metformin/jardiance combo and Monjuaro PTA             --monitor BS ac/hs. AKI has resolved-->resume low dose metformin and titrate as indicated  -10/29 well-controlled, will hold off on Mounjaro  CBG (last 3)  Recent Labs    12/23/22 1637 12/23/22 2133 12/24/22 0557  GLUCAP 115* 125* 106*    12.  Acute renal failure vs CKD IIIa: Resolving   -BUN a little higher at 23,creatinine down to 1.06.  Overall stable continue to monitor.  Losartan dose was decreased to 12.5 mg daily 13. H/o severe recurrent depression w/anxiety: Vraylar and Lexapro discontinued due to concerns of QT prolongation             --Is being followed by behavioral health/Dr. Elsie Saas. Had [facility] urgent care visit 09/25/22 --continue Xanax prn   14. OSA: Continue CPAP 15. H/o Lumbar spinal stenosis and  R-knee pain:  Resume gabapentin (mood and pain). She was going to outpatient therapy.   16.  Morbid Obesity BMI-50.5: Multifactorial. Encourage/educate on life style and diet changes to help with wt loss and improve overall activity and health. .  17. ADD: Discussed need for activating medication (mood and energy per char review)--has ritalin on home meds but just approved  --Continue Ritalin added per secure chat with Dr. Bjorn Pippin.  18. Vaginal discomfort: Likely due to candidiasis --miconazole added for treatment.  19.  Hyperlipidemia: continue statin  20.  Right upper extremity pain/right shoulder pain  -Will check x-ray of her right shoulder as this seems to be the greatest location of pain at this time.  Suspect musculoskeletal pain due to increased activity.  21.  Bilateral jaw pain.   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  She has pain with movement and tenderness to palpation of her jaw.  Continue voltaren gel  22.  Chills reported yesterday.  Reports this has resolved.  She has been afebrile  -10/28 WBC within normal limits today  23. Hypotension  Losartan dose was decreased to 12.5 mg daily  -10/29 BP stable     12/24/2022    6:04 AM 12/24/2022    5:00 AM 12/23/2022    9:31 PM  Vitals with BMI  Weight  342 lbs 6 oz   BMI  58.74   Systolic 103  93  Diastolic 65  55  Pulse 85  83     24. Right arm weakness/pain: -cervical spin XR ordered and discussed that it shows multilevel spondylosis anf facet arthropathy. Attempted to order MRI cervical spine on Saturday but she is unable to have LifeVest off for more than 10 minutes,  discussed steroids to reduced swelling in neck but she defers due to risks of worsening infection and weight gain, discussed increase in gabapentin or topamax and she chooses to try topamax, discussed that right arm symptoms improved temporarily with therapy, encouraged continued physical therapy to decompress spine, discussed that putting a lot of pressure while holding the walker could contribute.  -10/29 strength and pain continue to improve, continue to monitor daily    25. Dry Mouth  -10/28 Discussed with pharmacy, no recent medications that very likely to cause this, will start biotene   26.  Constipation.    -Schedule miralax  -Will order sorbitol 60ml 10/29  ADDENDUM: Plastic surgery recommending starting antibiotic.  Discussed with pharmacy will start Keflex 500 mg 3 times daily.  Appears she has tolerated cephalosporins before.  Appreciate assistance   LOS: 7 days A FACE TO FACE EVALUATION WAS PERFORMED  Fanny Dance 12/24/2022, 11:09 AM

## 2022-12-24 NOTE — Progress Notes (Signed)
SLP Cancellation Note  Patient Details Name: [name] MRN: [id number] DOB: 11/08/1965   Cancelled treatment:       SLP attempted to conduct skilled therapy session targeting cognitive retraining goals. Patient with neuropsychologist for evaluation with sign posted on door labeled "do not disturb." SLP returned throughout slotted session time to confirm patient remained unavailable throughout majority of scheduled time. SLP will continue to follow plan of care as indicated.                                                                                             Jeannie Done, M.A., CCC-SLP  Yetta Barre 12/24/2022, 2:44 PM

## 2022-12-24 NOTE — Progress Notes (Signed)
Occupational Therapy Session Note  Patient Details  Name: [name] MRN: [id number] Date of Birth: [birth year]  Today's Date: 12/24/2022 OT Individual Time: 4098-1191 OT Individual Time Calculation (min): 56 min    Short Term Goals: Week 1:  OT Short Term Goal 1 (Week 1): STGs=LTGs due to patient's length of stay.  Skilled Therapeutic Interventions/Progress Updates:  Pt received sitting in [facility] for skilled OT session with focus on functional mobility/transfers and AE for toileting. Pt agreeable to interventions, demonstrating overall pleasant mood. Pt with un-rated intermediate pain at chest-level. OT offering intermediate rest breaks and positioning suggestions throughout session to address pain/fatigue and maximize participation/safety in session.   Pt educated on use of quad cane, per request of PT. Pt ambulates with close supervision, but unable to process correct movement pattern, stating "I can't coordinate my legs like that with it." Pt toilets with Min A for posterior care. Pt shares she does not wear pants at home, and/or underwear due to urgency.   Time dedicated to reviewing toileting aids, including portable bidet, "bottom buddy," stationary bidet. Video shown for stationary bidet, patient sending video to husband for consideration.   Pt remained sitting in [facility] with all immediate needs met at end of session. Pt continues to be appropriate for skilled OT intervention to promote further functional independence.   Therapy Documentation Precautions:  Precautions Precautions: Fall Precaution Comments: Life Vest, Falls, Chronic low back and R-knee pain Restrictions Weight Bearing Restrictions: No RUE Weight Bearing: Weight bearing as tolerated   Therapy/Group: Individual Therapy  Lou Cal, OTR/L, MSOT  12/24/2022, 6:35 AM

## 2022-12-24 NOTE — Progress Notes (Signed)
Subjective: 57 year old female with a past medical history of postpartum cardiomyopathy, history of CHF with EF 30 to 35%, DM, morbid obesity who presented to [facility] ED on 12/05/2022 via EMS for cardiac arrest.  She was admitted to the hospital, initially in the ICU and intubated.  She is now extubated. She did receive intraosseous fluids with subsequent skin changes involving the left lower extremity and resulting left lower extremity wound and some skin necrosis.  Primary team reached out to [place] to reevaluate the wound given increased drainage.  Today, patient reports she is doing okay.  She states that some parts of her leg hurt, but she has not really looked at it.  She reports that they have been applying Silvadene to the wound.  She also reports chronic swelling.  Objective: Vital signs in last 24 hours: Temp:  [98.1 F (36.7 C)-98.2 F (36.8 C)] 98.1 F (36.7 C) (10/29 0604) Pulse Rate:  [83-85] 85 (10/29 0604) Resp:  [17-18] 18 (10/29 0604) BP: (93-103)/(55-65) 103/65 (10/29 0604) SpO2:  [95 %] 95 % (10/29 0604) Weight:  [136.1 kg-155.3 kg] 136.1 kg (10/29 1519) Last BM Date : 12/19/22  Intake/Output from previous day: 10/28 0701 - 10/29 0700 In: 958 [P.O.:958] Out: -  Intake/Output this shift: No intake/output data recorded.  General appearance: alert, cooperative, and no distress Resp: Unlabored breathing, no respiratory distress Extremities: Left lower extremity dressings were removed at bedside.  Patient has a large wound to the lower left extremity that is approximately 25 cm x 22 cm.  There is eschar noted almost throughout the entirety of the wound with the exception of a little bit of granulation tissue noted at the inferior aspect.  There is a little bit of mild erythema noted to the inferior aspect of the leg.  There is swelling throughout the lower extremity.  No tenderness to palpation to the posterior calf.  Patient does have sensation intact distally and a  palpable pedal pulse.  Patient is able to wiggle her toes.  No active drainage on exam.  Dressed wound at the bedside with Xeroform, Telfa, ABD, Kerlix and Ace wrap  Lab Results:     Latest Ref Rng & Units 12/23/2022    7:23 AM 12/22/2022    5:19 AM 12/21/2022    7:35 AM  CBC  WBC 4.0 - 10.5 K/uL 8.8  11.3  9.2   Hemoglobin 12.0 - 15.0 g/dL 40.9  81.1  91.4   Hematocrit 36.0 - 46.0 % 35.4  35.5  35.0   Platelets 150 - 400 K/uL 291  302  272     BMET Recent Labs    12/23/22 0723  NA 136  K 3.7  CL 100  CO2 25  GLUCOSE 110*  BUN 25*  CREATININE 0.88  CALCIUM 8.7*   PT/INR No results for input(s): "LABPROT", "INR" in the last 72 hours. ABG No results for input(s): "PHART", "HCO3" in the last 72 hours.  Invalid input(s): "PCO2", "PO2"  Studies/Results: No results found.  Anti-infectives: Anti-infectives (From admission, onward)    None       Assessment/Plan: Left lower extremity wound: -Recommend starting calcium alginate to the wound.  Dressing changes as follows: Calcium alginate, ABDs, Kerlix, Ace wrap daily to the wound. -Also recommend patient start an antibiotic given redness surrounding the wound. Abx selection per primary  -I did discuss the case with Dr. Ladona Ridgel.  He is in agreement with the plan. -Pain management and pain control per primary  LOS:  7 days    [name], PA-C 12/24/2022

## 2022-12-24 NOTE — Progress Notes (Signed)
Physical Therapy Session Note  Patient Details  Name: [name] MRN: [id number] Date of Birth: 10-Apr-1965  Today's Date: 12/24/2022 PT Individual Time: 0915-1001 PT Individual Time Calculation (min): 46 min   Short Term Goals: Week 1:  PT Short Term Goal 1 (Week 1): pt will ambulate 150 ft CGA PT Short Term Goal 2 (Week 1): pt will perform STS w/ supervision PT Short Term Goal 3 (Week 1): pt will perform bed mobility w/ minA PT Short Term Goal 4 (Week 1): pt will ambulate 8 stairs CGA PT Short Term Goal 5 (Week 1): pt will perform bed to chair transfer supervision  Skilled Therapeutic Interventions/Progress Updates:      Pt seated in [facility] with MD present for daily rounds. Pt reports 3/10 R lateral breast pain, and increased movement of R UE today.   Pt live vest beeping indicating poor connection of pads. Nurse present to adjust pads and ensure contact on lidocaine patch.   Pt seated in WC and brushing teeth, and washing  face with mod I.   Home measurement sheet provided: with emphasis on measuring height of bed as pt reports it is very tall, height of one step for home entry, and width of hallways and doorways as pt reports rollator does not fit.   Gait from room to nursing station with no AD and CGA for stability, verbal cues provided for reciporcal step length.   Gait from nursing station to ortho gym with Rollator and supervision. Plan to trial quad cane next session.   Pt performed ambulatory transfer to queen bed in rehab apartment with supervision, and performed sit to supine and supine to sit with supervision and increased time, verbal cues provided for technique.   Pt seated in WC at end of session with all needs within reach and chair alarm on.         Therapy Documentation Precautions:  Precautions Precautions: Fall Precaution Comments: Life Vest, Falls, Chronic low back and R-knee pain Restrictions Weight Bearing Restrictions: No RUE Weight Bearing: Weight  bearing as tolerated General:   Vital Signs: Therapy Vitals Temp: 98.1 F (36.7 C) Pulse Rate: 85 Resp: 18 BP: 103/65 Patient Position (if appropriate): Lying Oxygen Therapy SpO2: 95 % O2 Device: Room Air Pain:   Mobility:   Locomotion :    Trunk/Postural Assessment :    Balance:   Exercises:   Other Treatments:      Therapy/Group: Individual Therapy  Ambrose Finland 12/24/2022, 7:41 AM

## 2022-12-24 NOTE — Progress Notes (Incomplete Revision)
Physical Therapy Session Note  Patient Details  Name: [name] MRN: [id number] Date of Birth: May 26, 1965  Today's Date: 12/24/2022 PT Individual Time: 0915-1001 PT Individual Time Calculation (min): 46 min   Short Term Goals: Week 1:  PT Short Term Goal 1 (Week 1): pt will ambulate 150 ft CGA PT Short Term Goal 2 (Week 1): pt will perform STS w/ supervision PT Short Term Goal 3 (Week 1): pt will perform bed mobility w/ minA PT Short Term Goal 4 (Week 1): pt will ambulate 8 stairs CGA PT Short Term Goal 5 (Week 1): pt will perform bed to chair transfer supervision  Skilled Therapeutic Interventions/Progress Updates:      Pt seated in [facility] with MD present for daily rounds. Pt reports 3/10 R lateral breast pain, and increased movement of R UE today.   Pt live vest beeping indicating poor connection of pads. Nurse present to adjust pads and ensure contact on lidocaine patch.   Pt seated in WC and brushing teeth, and washing  face with mod I.   Home measurement sheet provided: with emphasis on measuring height of bed as pt reports it is very tall, height of one step for home entry, and width of hallways and doorways as pt reports rollator does not fit.   Gait from room to nursing station with no AD and CGA for stability, verbal cues provided for reciporcal step length.   Gait from nursing station to ortho gym with Rollator and supervision. Plan to trial quad cane next session.   Pt performed ambulatory transfer to queen bed in rehab apartment with supervision, and performed sit to supine and supine to sit with supervision and increased time, verbal cues provided for technique.   Pt seated in WC at end of session with all needs within reach and chair alarm on.   Treatment Session 2    Pt supine in bed upon arrival. Pt agreeable to therapy. Pt reports 5/10 pain in R hip        Therapy Documentation Precautions:  Precautions Precautions: Fall Precaution Comments: Life Vest,  Falls, Chronic low back and R-knee pain Restrictions Weight Bearing Restrictions: No RUE Weight Bearing: Weight bearing as tolerated General:   Vital Signs: Therapy Vitals Temp: 98.1 F (36.7 C) Pulse Rate: 85 Resp: 18 BP: 103/65 Patient Position (if appropriate): Lying Oxygen Therapy SpO2: 95 % O2 Device: Room Air Pain:   Mobility:   Locomotion :    Trunk/Postural Assessment :    Balance:   Exercises:   Other Treatments:      Therapy/Group: Individual Therapy  Ambrose Finland 12/24/2022, 7:41 AM

## 2022-12-25 ENCOUNTER — Inpatient Hospital Stay (HOSPITAL_COMMUNITY): Payer: Medicare Other

## 2022-12-25 DIAGNOSIS — T148XXA Other injury of unspecified body region, initial encounter: Secondary | ICD-10-CM

## 2022-12-25 DIAGNOSIS — I5022 Chronic systolic (congestive) heart failure: Secondary | ICD-10-CM | POA: Diagnosis not present

## 2022-12-25 DIAGNOSIS — R5381 Other malaise: Secondary | ICD-10-CM | POA: Diagnosis not present

## 2022-12-25 DIAGNOSIS — L089 Local infection of the skin and subcutaneous tissue, unspecified: Secondary | ICD-10-CM

## 2022-12-25 DIAGNOSIS — E1169 Type 2 diabetes mellitus with other specified complication: Secondary | ICD-10-CM | POA: Diagnosis not present

## 2022-12-25 DIAGNOSIS — I959 Hypotension, unspecified: Secondary | ICD-10-CM | POA: Diagnosis not present

## 2022-12-25 LAB — GLUCOSE, CAPILLARY
Glucose-Capillary: 101 mg/dL — ABNORMAL HIGH (ref 70–99)
Glucose-Capillary: 115 mg/dL — ABNORMAL HIGH (ref 70–99)
Glucose-Capillary: 129 mg/dL — ABNORMAL HIGH (ref 70–99)
Glucose-Capillary: 138 mg/dL — ABNORMAL HIGH (ref 70–99)

## 2022-12-25 MED ORDER — SORBITOL 70 % SOLN
60.0000 mL | Freq: Once | Status: AC
  Administered 2022-12-25: 60 mL via ORAL
  Filled 2022-12-25: qty 60

## 2022-12-25 MED ORDER — POLYETHYLENE GLYCOL 3350 17 G PO PACK
17.0000 g | PACK | Freq: Two times a day (BID) | ORAL | Status: DC
  Administered 2022-12-26 – 2022-12-28 (×4): 17 g via ORAL
  Filled 2022-12-25 (×8): qty 1

## 2022-12-25 NOTE — Patient Care Conference (Signed)
Inpatient RehabilitationTeam Conference and Plan of Care Update Date: 12/25/2022   Time: 12:02 PM    Patient Name: [name]      Medical Record Number: [id number]  Date of Birth: Jun 24, 1965 Sex: Female         Room/Bed: 4M08C/4M08C-01 Payor Info: Payor: MEDICARE / Plan: MEDICARE PART A AND B / Product Type: *No Product type* /    Admit Date/Time:  12/17/2022  6:31 PM  Primary Diagnosis:  Debility  Hospital Problems: Principal Problem:   Debility Active Problems:   Chronic combined systolic and diastolic heart failure (HCC)   Chest wall pain   Diabetes mellitus (HCC)   Chronic kidney disease    Expected Discharge Date: Expected Discharge Date: 12/30/22  Team Members Present: Physician leading conference: Dr. Fanny Dance Social Worker Present: Dossie Der, LCSW Nurse Present: Chana Bode, RN PT Present: Ambrose Finland, PT OT Present: Lou Cal, OT SLP Present: Feliberto Gottron, SLP PPS Coordinator present : Fae Pippin, SLP     Current Status/Progress Goal Weekly Team Focus  Bowel/Bladder   Patient is continent of ladder/bowel, LBM 10/   Maintain continence   Assess toileting needs Q2 HRS AND PRN    Swallow/Nutrition/ Hydration               ADL's   Setup/Supervision for UB ADLs (only wears dresses at home), Min A for toileting (education provided on toileting aids), and Min A for LB bathing (AE provided); fluctuating levels of RUE strength/FMC as dominant UE   Supervision due to Life Vest   Discharge planning    Mobility   bed mobility supervision, sit to stand and stand pivot transfer sup, gait x110 feet with no AD and CGA, with rollator and supervision, 8 6 inch steps with B HR and CGA   sup/mod I  D/C 11/4, is pt wound on L LE a hinderence to D/C? barriers to discharge: pt reports rollator unable to fit through hallways, plan to trial [facility] today. Home measurement sheet provided, front entrance step-rails unstable, anxiety. Pt has a  rollator. Follow up    Communication                Safety/Cognition/ Behavioral Observations  patient close to baseline level of functioning, though stress of admission likely exacerbating baseline ADHD symptoms   modI   memory, attention, stress management, compensatory strategies    Pain                Skin   S/P Left leg large wound and abrarion ubder right breast with new orders, see chart   Prevention of infection or additional irritation to injuied area of left leg and right breast area  Wound assessment QS and prn, perform dressing change as ordered,and education familyconcerning treatment plan      Discharge Planning:  HOme with husband who was assisting with her care prior to admission. Was attending OP at [facility]. Await team's recommendations   Team Discussion: Patient with debility post cardiac arrest with Life Vest in place and cellulitis on left leg wound; oral abx. initiated.  Dressing change /wound care updated.  Pain right upper extremity; X-ray noted cervical spondylosis. Limited by pain, anxiety and self limiting behaviors and mild ADHD symptoms exacerbated by anxiety.  Patient on target to meet rehab goals: yes, currently needs set up - supervision for ADLs. Needs supervision for sit- stand and stand pivot transfers. Able to ambulate jup to 110' without an assistive device and CGA   *See  Care Plan and progress notes for long and short-term goals.   Revisions to Treatment Plan:  Toileting aides Trial Single [employer]   Teaching Needs: Life Vest wear/care, skin care/wound care, medications, safety, transfers, toileting, etc.   Current Barriers to Discharge: Decreased caregiver support, Home enviroment access/layout, Wound care, and Behavior  Possible Resolutions to Barriers: Family education HH follow up services DME: [facility], [facility]     Medical Summary Current Status: debility, ABI, anxiety, R arm pain/weaknesss, wound, NICM, DM2  Barriers  to Discharge: Medical stability;Cardiac Complications;Morbid Obesity;Complicated Wound;Hypotension  Barriers to Discharge Comments: debility, ABI, anxiety, R arm pain/weaknesss, wound care, NICM, DM2, constipation Possible Resolutions to [employer] Focus: ABX for wound infection, Wound care, laxatives, monitor wt   Continued Need for Acute Rehabilitation Level of Care: The patient requires daily medical management by a physician with specialized training in physical medicine and rehabilitation for the following reasons: Direction of a multidisciplinary physical rehabilitation program to maximize functional independence : Yes Medical management of patient stability for increased activity during participation in an intensive rehabilitation regime.: Yes Analysis of laboratory values and/or radiology reports with any subsequent need for medication adjustment and/or medical intervention. : Yes   I attest that I was present, lead the team conference, and concur with the assessment and plan of the team.   Chana Bode B 12/25/2022, 2:51 PM

## 2022-12-25 NOTE — Plan of Care (Signed)
Pt's plan of care adjusted to 15/7 after speaking with care team and discussed with MD as pt currently unable to tolerate current therapy schedule with OT, PT, and SLP.   

## 2022-12-25 NOTE — Progress Notes (Addendum)
Physical Therapy Weekly Progress Note  Patient Details  Name: [name] MRN: [id number] Date of Birth: 1965/06/21  Beginning of progress report period: December 18, 2022 End of progress report period: December 25, 2022  Today's Date: 12/25/2022 PT Individual Time: 8413-2440 PT Individual Time Calculation (min): 65 min  and Today's Date: 12/25/2022 PT Missed Time: 10 Minutes Missed Time Reason: Wound care  Patient has met 4 of 5 short term goals.  Pt is performing bed mobility on apartment bed with supervision, sit to stand and stand pivot transfer with supervision with no AD, ambulating 120 feet with no AD and CGA/sup, ascending/descending 8 6 inch steps with unilateral handrail and CGA with reciprocal gait with ascending and step to gait with descending. Pt is ascending descending 6 inch curb step with Rollator and supervision for home entry.   Patient continues to demonstrate the following deficits muscle weakness, decreased cardiorespiratoy endurance,  , and decreased standing balance and decreased balance strategies and therefore will continue to benefit from skilled PT intervention to increase functional independence with mobility.  Patient progressing toward long term goals..  Continue plan of care.  PT Short Term Goals Week 1:  PT Short Term Goal 1 (Week 1): pt will ambulate 150 ft CGA PT Short Term Goal 1 - Progress (Week 1): Progressing toward goal PT Short Term Goal 2 (Week 1): pt will perform STS w/ supervision PT Short Term Goal 2 - Progress (Week 1): Met PT Short Term Goal 3 (Week 1): pt will perform bed mobility w/ minA PT Short Term Goal 3 - Progress (Week 1): Met PT Short Term Goal 4 (Week 1): pt will ambulate 8 stairs CGA PT Short Term Goal 4 - Progress (Week 1): Met PT Short Term Goal 5 (Week 1): pt will perform bed to chair transfer supervision PT Short Term Goal 5 - Progress (Week 1): Met Week 2:  PT Short Term Goal 1 (Week 2): STG=LTG 2/2 ELOS  Skilled  Therapeutic Interventions/Progress Updates:    Attempted to make up missed minutes at 8:15, however pt refusing as pt just woke up, pt requesting therapist to return during regular scheduled time.    Treatment Session 1   Pt seated in WC upon arrival. Pt agreeable to therapy. Pt reports 9/10 pain in L knee and spine after dressing change on L LE yesterday. Pt highly perseverating about medications. Nurse present to administer medications during session.   Discussed trailing gait with cane, pt expressed concerns about weight bearing through UE as pt "tense" and "anxious" and "gets stiff and loses function of her arm." Pt overall very anxious, therapist opted to ambulate with no AD. Pt ambulated ~20 feet with no AD with step to antalgic gait, reports unable to tolerate.   Pt performed sit to stand and stand pivot transfer throughout session with supervision.   Discussed home entry: pt has curb step from parking lot to side walk, and 8 inch curb step onto platform into home with B HR (however not functional as pt reports they are unstable.) Therapist provided demonstration of curb step navigation with Rollator. Pt reports it is not possible as pt parking lot does not have handicapped parking and the spacing between cars impedes her from being able to fit the rollator. Pt reports her plan is to hold onto the car as she did at baseline. Pt ascended/descended 5 inch curb step with no AD and L HHA, verbal cues provided for leading with R UE for ascending  and L  LE for descending. Pt reports inability to hold onto anything for home entry step, therapist reiterated technique with rollator. Pt willing to try. Pt ascended/descended 5 inch curb step with rollator and CGA progressing to close superivison with therapist providing assist for management of rollator, verbal cues provided for technique.   Pt reports L knee pain,  and nausea, and increased spinal pain. Pt requesting MD to look at her knee. Notified PA.  Nurse and PA present to change pt dressing. Pt positioned WC with supervision and performed stand pivot transfer and supine to sit with supervision/mod I.   Pt supine in bed at end of session with all needs within reach and nurse in PA in room. Pt missed 10 minutes 2/2 nursing care.        Therapy Documentation Precautions:  Precautions Precautions: Fall Precaution Comments: Life Vest, Falls, Chronic low back and R-knee pain Restrictions Weight Bearing Restrictions: No RUE Weight Bearing: Weight bearing as tolerated  Therapy/Group: Individual Therapy  [facility] Ambrose Finland, [place], DPT  12/25/2022, 7:48 AM

## 2022-12-25 NOTE — Progress Notes (Signed)
Patient refuse CPAP. 

## 2022-12-25 NOTE — Progress Notes (Signed)
   12/25/22 0008  BiPAP/CPAP/SIPAP  Reason BIPAP/CPAP not in use Non-compliant (refused)

## 2022-12-25 NOTE — Progress Notes (Signed)
Speech Language Pathology Weekly Progress and Session Note  Patient Details  Name: [name] MRN: [id number] Date of Birth: April 17, 1965  Beginning of progress report period: December 18, 2022 End of progress report period: December 25, 2022  Today's Date: 12/25/2022 SLP Individual Time: 0904-1000 SLP Individual Time Calculation (min): 56 min  Short Term Goals: Week 1: SLP Short Term Goal 1 (Week 1): Patient will complete moderately complex problem solving with 90% acc with sup A SLP Short Term Goal 1 - Progress (Week 1): Met SLP Short Term Goal 2 (Week 1): Patient will recall novel information given a 10- 15 minute delay with 90% acc given sup A SLP Short Term Goal 2 - Progress (Week 1): Met SLP Short Term Goal 3 (Week 1): Patient will demonstrate sustained attention during functional tasks for >10 minutes given min multimodal A SLP Short Term Goal 3 - Progress (Week 1): Met  New Short Term Goals: Week 2: SLP Short Term Goal 1 (Week 2): STGs=LTGs d/t ELOS  Weekly Progress Updates: Patient has made progress towards therapy goals, meeting 3/3 short term goals set this reporting period. Patient is insightful to deficits, and majority of cognitive deficits are baseline, though deficits are exacerbated by stress of current hospital admission. Patient is currently supervision for accessing guided meditation strategies and min assist for sustained attention (supervision for sustained attention to mindfulness meditation task). Patient recalls novel information after 15 minute delay with 90% accuracy given supervision assist. Patient and family education ongoing. Patient will continue to benefit from skilled therapy services during remainder of CIR stay.    Intensity: Minumum of 1-2 x/day, 30 to 90 minutes Frequency: 1 to 3 out of 7 days Duration/Length of Stay: 10- 14 days Treatment/Interventions: Cognitive remediation/compensation;Internal/external aids;Speech/Language facilitation;Cueing  hierarchy;Environmental controls;Functional tasks;Patient/family education;Therapeutic Activities  Daily Session  Skilled Therapeutic Interventions: SLP conducted skilled therapy session targeting cognitive retraining goals. Patient and SLP discussed revelations patient reports making during conversation with neuropsychologist re: her ADD/attention deficits yesterday afternoon. Patient and SLP discuss attention deficits as it relates to cognitive function/completion of ADLs as well as applicable strategies to assist with these deficits. Much of these deficits are baseline, though current stress of admission exacerbates impairments due to increased cognitive load and unfamiliar environment/daily schedule. SLP and patient discussed stress management techniques including mindfulness meditation, then completed 12 minute body scan. At previous session, SLP provided patient with handout outlining how to access meditation videos on Youtube, recommended patient complete meditation daily to promote stress reduction. Patient verbalized understanding. Patient was left in chair with call bell in reach and chair alarm set. SLP will continue to target goals per plan of care.       Pain Pain Assessment Pain Scale: Faces Faces Pain Scale: Hurts little more Pain Intervention(s): Medication (See eMAR)  Therapy/Group: Individual Therapy  Jeannie Done, M.A., CCC-SLP   Yetta Barre 12/25/2022, 9:18 AM

## 2022-12-25 NOTE — Progress Notes (Signed)
Patient ID: [name], female   DOB: 08/24/1965, 57 y.o.   MRN: [id number]  Met with pt and husband to update them regarding team conference goals of supervision-mod/I and target discharge date of 11/4. Pt feels she is not doing as well as the therapy team feels she is. She is having right sided weakness which scares here and wants to know reasons why. Encouraged her to talk with MD and asked husband to remind her to do this since her memory is poor. Addressed the issue of husband not being able to be paid to take care of her, due to [employer] does not allow this with their CAP program. Discussed if pt reached mod/I she would not ned 24/7 care. She reports her husband is her cheerleader when her depression is so bad she will not even get up to use the bathroom. Pt is agreeable to worker arranging home health and has no preference. Will continue to work on discharge needs.

## 2022-12-25 NOTE — Progress Notes (Signed)
Occupational Therapy Weekly Progress Note  Patient Details  Name: [name] MRN: [id number] Date of Birth: 12/11/1965  Beginning of progress report period: December 18, 2022 End of progress report period: December 25, 2022  Today's Date: 12/25/2022 OT Individual Time: 1914-7829 OT Individual Time Calculation (min): 67 min    Patient has made strong progress towards OT POC this reporting period. Pt currently functioning at setup/supervision for UB ADL tasks and sink-side seated grooming. Pt wears long dresses at home, at time wearing undergarments, plan to train on use of reacher for threading and sock aid for footwear management. Pt's spouse intermediately present during therapy day, official caregiver education to be planned closer to discharge.   Patient continues to demonstrate the following deficits: muscle weakness, decreased cardiorespiratoy endurance, and decreased functional reach impacting toileting independence; and therefore will continue to benefit from skilled OT intervention to enhance overall performance with BADL, iADL, and Reduce care partner burden.  Patient progressing toward long term goals..  Continue plan of care.  OT Short Term Goals Week 1:  OT Short Term Goal 1 (Week 1): STGs=LTGs due to patient's length of stay. OT Short Term Goal 1 - Progress (Week 1): Progressing toward goal Week 2:  OT Short Term Goal 1 (Week 2): STGs=LTGs due to patient's length of stay.  Skilled Therapeutic Interventions/Progress Updates:  Pt received sitting in [facility] for skilled OT session with focus on functional mobility for general conditioning and RUE strength assessment. Pt agreeable to interventions, demonstrating overall pleasant mood. Pt with intermediate pain in L-knee,  OT offering intermediate rest breaks and positioning suggestions throughout session to address pain/fatigue and maximize participation/safety in session.   Pt performs all STS transfers with supervision/Mod I,  ambulating from room<>main therapy gym requiring an average of 2 standing/1 seated break to manage fatigue and L-knee pain. Rollator used during ambulation from [employer] therapy gym, pt requesting to walk without AD on way back from main therapy gym, CGA/SUP for all.   In main therapy gym, Pt UE strength assessed as patient with increased complaints of perceived RUE weakness, details below: RUE Grip= ~9 lbs LUE Grip=~20 lbs RUE MMT=3+/5 with pain  LUE MMT= LUE 9-hole peg test: ~30 sec LUE 9-hole peg test: ~31 sec  Pt remained sitting in WC with all immediate needs met at end of session. Pt continues to be appropriate for skilled OT intervention to promote further functional independence.   Therapy Documentation Precautions:  Precautions Precautions: Fall Precaution Comments: Life Vest, Falls, Chronic low back and R-knee pain Restrictions Weight Bearing Restrictions: No RUE Weight Bearing: Weight bearing as tolerated   Therapy/Group: Individual Therapy  Lou Cal, OTR/L, MSOT  12/25/2022, 6:05 AM

## 2022-12-25 NOTE — Progress Notes (Signed)
PROGRESS NOTE   Subjective/Complaints: She was seen by plastic surgery yesterday, abx recommended and were started. Calcium alginate for wound care started.  She reports dressings were too tight yesterday but this improved after nursing loosed them.   Last Bm x2 yesterday 10/29   ROS: Patient denies fever, headache, abdominal pain, diarrhea, motor or sensory changes, rash. +right sided weakness- improved + chills resolved + Constipation-improved    objective:   No results found. Recent Labs    12/23/22 0723  WBC 8.8  HGB 11.7*  HCT 35.4*  PLT 291   Recent Labs    12/23/22 0723  NA 136  K 3.7  CL 100  CO2 25  GLUCOSE 110*  BUN 25*  CREATININE 0.88  CALCIUM 8.7*   No intake or output data in the 24 hours ending 12/25/22 0903        Physical Exam: Vital Signs Blood pressure (!) 111/59, pulse 89, temperature 98.8 F (37.1 C), resp. rate 18, height 5\' 4"  (1.626 m), weight 136.1 kg, SpO2 92%.    General:  No apparent distress, sitting in WC working with therapy HEENT: NCAT, MMM  Tenderness noted at bilateral TMJ, pain with jaw movements Heart: RRR, wearing life vest  Chest: CTAB abdomen: Soft, non-tender, non-distended, bowel sounds positive. Extremities: large [name] wound covered in dressing and ace wrap Psych: Cooperative, a little flat Skin: Clean and intact without signs of breakdown Neuro:  Alert and oriented x3, follows commands, cranial nerves II through XII gorssly intact, no speech or language deficits noted, + memory deficits Sensation intact light touch in all 4 extremities Poor safety awareness.   Strength 4+ out of 5 in left upper extremity Strength 4 out of 5 right upper extremity Strength 4+ out of 5 right lower extremity Strength 2-3 out of 5 left lower extremity, exam stable 10/27 Musculoskeletal: No joint swelling noted.  Diffuse tenderness throughout right upper extremity improved she  is lifting her arm to gravity without difficulty   Assessment/Plan: 1. Functional deficits which require 3+ hours per day of interdisciplinary therapy in a comprehensive inpatient rehab setting. Physiatrist is providing close team supervision and 24 hour management of active medical problems listed below. Physiatrist and rehab team continue to assess barriers to discharge/monitor patient progress toward functional and medical goals  Care Tool:  Bathing    Body parts bathed by patient: Right arm, Left arm, Chest, Abdomen, Front perineal area, Face   Body parts bathed by helper: Buttocks, Right lower leg, Right upper leg, Left upper leg Body parts n/a: Left lower leg   Bathing assist Assist Level: Moderate Assistance - Patient 50 - 74%     Upper Body Dressing/Undressing Upper body dressing   What is the patient wearing?: Hospital gown only    Upper body assist Assist Level: Minimal Assistance - Patient > 75%    Lower Body Dressing/Undressing Lower body dressing      What is the patient wearing?: Hospital gown only     Lower body assist Assist for lower body dressing: Minimal Assistance - Patient > 75%     Toileting Toileting    Toileting assist Assist for toileting: Moderate Assistance - Patient  50 - 74%     Transfers Chair/bed transfer  Transfers assist     Chair/bed transfer assist level: Contact Guard/Touching assist     Locomotion Ambulation   Ambulation assist      Assist level: Contact Guard/Touching assist Assistive device: Rollator Max distance: 100   Walk 10 feet activity   Assist     Assist level: Contact Guard/Touching assist Assistive device: Rollator   Walk 50 feet activity   Assist    Assist level: Contact Guard/Touching assist Assistive device: Rollator    Walk 150 feet activity   Assist Walk 150 feet activity did not occur: Safety/medical concerns         Walk 10 feet on uneven surface  activity   Assist Walk  10 feet on uneven surfaces activity did not occur: Safety/medical concerns   Assist level: Contact Guard/Touching assist (ramp.) Assistive device: Rollator   Wheelchair     Assist Is the patient using a wheelchair?: Yes Type of Wheelchair: Manual    Wheelchair assist level: Dependent - Patient 0%      Wheelchair 50 feet with 2 turns activity    Assist        Assist Level: Dependent - Patient 0%   Wheelchair 150 feet activity     Assist      Assist Level: Dependent - Patient 0%   Blood pressure (!) 111/59, pulse 89, temperature 98.8 F (37.1 C), resp. rate 18, height 5\' 4"  (1.626 m), weight 136.1 kg, SpO2 92%.  Medical Problem List and Plan: 1. 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 Continue life vest              -patient may shower             -ELOS/Goals: 10-14 day,  PT/OT supervision to mod I, SLP mod I              -Continue CIR  -Discussed safety with pt, call nurses to get up.  -Team conference today please see physician documentation under team conference tab, met with team  to discuss problems,progress, and goals. Formulized individual treatment plan based on medical history, underlying problem and comorbidities.    2.  Antithrombotics: -DVT/anticoagulation:  Pharmaceutical: Lovenox             -antiplatelet therapy: N/A 3. Pain Management:  Oxycodone prn for pain control.   -10/24 increase gabapentin to 200 mg 3 times daily, start lidocaine patch for chest wall  -10/25 increase gabapentin to 300 mg 3 times daily  -10/30 pain controlled, continue current regimen 4. Mood/Behavior/Sleep: Team to provide ego support. LCSW to follow for evaluation and support.              --Melatonin prn for insomnia.              -antipsychotic agents: N/A at this time              --Neuropsych for coping skills.  5. Neuropsych/cognition: This patient is likely capable of making decisions on her own behalf. 6. Severe  left lower extremity wound: wound examined with nursing and provided patient education: Added proteiin supplements and Vitamin C to promote wound healing             --BID dressing changes with silvadene per plastics  -10/30 seen by plastic surgery yesterday, calcium alginate for wound care, Keflex started yesterday for redness possible skin infection in skin surrounding wound- discussed with pharmacy mild  penicillin allergy,  she has tolerated cephalosporins   7. Fluids/Electrolytes/Nutrition: Strict I/O. Low salt diet. Juven and prostat added             -- Recheck CMET tomorrow 8. VT/VF arrest: Life Vest with plans for ICD once leg wound resolves?  9. NICM w/ HFrEF:  GDMT being resumed by Cards--on Aldactone, Lasix, Cozaar, Farxiga and Lipitor             --Daily wts and monitor for signs of overload. Strict I/O   -10/28 cardiology has signed off, follow-up appointment outpatient 11/26 and EPP 11/20  -10/29 will ask for standing weights as do not think current weights are accurate.  Her weight went from low 130s kilograms 10/23 and prior days to low 150 kg the following day  -10/30 new wt stable from prior, continue to monitor    Filed Weights   12/23/22 0540 12/24/22 0500 12/24/22 1519  Weight: (!) 153.9 kg (!) 155.3 kg 136.1 kg    10. [name] wound with necrosis: Plastic consulted 10/22-->now on silvadene bid for local care --supplements added to promote wound healing.  11.  T2DM: Hgb A1c-  Was pm metformin/jardiance combo and Monjuaro PTA             --monitor BS ac/hs. AKI has resolved-->resume low dose metformin and titrate as indicated  -10/29 - 30 well-controlled, will hold off on Mounjaro  CBG (last 3)  Recent Labs    12/24/22 1700 12/24/22 2144 12/25/22 0629  GLUCAP 139* 97 101*    12.  Acute renal failure vs CKD IIIa: Resolving   -BUN a little higher at 23,creatinine down to 1.06.  Overall stable continue to monitor.  Losartan dose was decreased to 12.5 mg daily 13. H/o  severe recurrent depression w/anxiety: Vraylar and Lexapro discontinued due to concerns of QT prolongation             --Is being followed by behavioral health/Dr. Elsie Saas. Had [facility] urgent care visit 09/25/22 --continue Xanax prn   14. OSA: Continue CPAP 15. H/o Lumbar spinal stenosis and  R-knee pain:  Resume gabapentin (mood and pain). She was going to outpatient therapy.   16. Morbid Obesity BMI-50.5: Multifactorial. Encourage/educate on life style and diet changes to help with wt loss and improve overall activity and health. .  17. ADD: Discussed need for activating medication (mood and energy per char review)--has ritalin on home meds but just approved  --Continue Ritalin added per secure chat with Dr. Bjorn Pippin.  18. Vaginal discomfort: Likely due to candidiasis --miconazole added for treatment.  19.  Hyperlipidemia: continue statin  20.  Right upper extremity pain/right shoulder pain  -Will check x-ray of her right shoulder as this seems to be the greatest location of pain at this time.  Suspect musculoskeletal pain due to increased activity.  21.  Bilateral jaw pain.   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  She has pain with movement and tenderness to palpation of her jaw.  Continue voltaren gel  22.  Chills reported yesterday.  Reports this has resolved.  She has been afebrile  -10/28 WBC within normal limits today  23. Hypotension  Losartan dose was decreased to 12.5 mg daily  -10/30 controlled     12/25/2022    5:22 AM 12/24/2022    8:07 PM 12/24/2022    3:19 PM  Vitals with BMI  Weight   300 lbs  BMI   51.47  Systolic 111 104   Diastolic 59 72   Pulse 89  85      24. Right arm weakness/pain: -cervical spin XR ordered and discussed that it shows multilevel spondylosis anf facet arthropathy. Attempted to order MRI cervical spine on Saturday but she is unable to have LifeVest off for more than 10 minutes, discussed steroids to reduced swelling in neck but she defers due to risks of worsening  infection and weight gain, discussed increase in gabapentin or topamax and she chooses to try topamax, discussed that right arm symptoms improved temporarily with therapy, encouraged continued physical therapy to decompress spine, discussed that putting a lot of pressure while holding the walker could contribute.  -10/30 improved, continue to monitor   25. Dry Mouth  -10/28 Discussed with pharmacy, no recent medications that very likely to cause this, will start biotene   26.  Constipation.    -Schedule miralax  -Will order sorbitol 60ml 10/29  -10/30 LBM yesterday but amount not know, continue miralax, will check xray abdomen regarding stool burden       LOS: 8 days A FACE TO FACE EVALUATION WAS PERFORMED  Fanny Dance 12/25/2022, 9:03 AM

## 2022-12-25 NOTE — Progress Notes (Signed)
Patient refused Cpap for the night

## 2022-12-26 ENCOUNTER — Ambulatory Visit (HOSPITAL_BASED_OUTPATIENT_CLINIC_OR_DEPARTMENT_OTHER): Payer: Medicare Other | Admitting: Physical Therapy

## 2022-12-26 DIAGNOSIS — M1712 Unilateral primary osteoarthritis, left knee: Secondary | ICD-10-CM

## 2022-12-26 DIAGNOSIS — E1169 Type 2 diabetes mellitus with other specified complication: Secondary | ICD-10-CM | POA: Diagnosis not present

## 2022-12-26 DIAGNOSIS — R5381 Other malaise: Secondary | ICD-10-CM | POA: Diagnosis not present

## 2022-12-26 DIAGNOSIS — I959 Hypotension, unspecified: Secondary | ICD-10-CM | POA: Diagnosis not present

## 2022-12-26 DIAGNOSIS — I5022 Chronic systolic (congestive) heart failure: Secondary | ICD-10-CM | POA: Diagnosis not present

## 2022-12-26 LAB — CBC
HCT: 36.1 % (ref 36.0–46.0)
Hemoglobin: 11.7 g/dL — ABNORMAL LOW (ref 12.0–15.0)
MCH: 28.3 pg (ref 26.0–34.0)
MCHC: 32.4 g/dL (ref 30.0–36.0)
MCV: 87.2 fL (ref 80.0–100.0)
Platelets: 303 10*3/uL (ref 150–400)
RBC: 4.14 MIL/uL (ref 3.87–5.11)
RDW: 14.3 % (ref 11.5–15.5)
WBC: 7.3 10*3/uL (ref 4.0–10.5)
nRBC: 0 % (ref 0.0–0.2)

## 2022-12-26 LAB — OCCULT BLOOD X 1 CARD TO LAB, STOOL: Fecal Occult Bld: NEGATIVE

## 2022-12-26 LAB — BASIC METABOLIC PANEL
Anion gap: 10 (ref 5–15)
BUN: 22 mg/dL — ABNORMAL HIGH (ref 6–20)
CO2: 29 mmol/L (ref 22–32)
Calcium: 8.9 mg/dL (ref 8.9–10.3)
Chloride: 97 mmol/L — ABNORMAL LOW (ref 98–111)
Creatinine, Ser: 1.07 mg/dL — ABNORMAL HIGH (ref 0.44–1.00)
GFR, Estimated: >60 mL/min (ref >60)
Glucose, Bld: 117 mg/dL — ABNORMAL HIGH (ref 70–99)
Potassium: 3.5 mmol/L (ref 3.5–5.1)
Sodium: 136 mmol/L (ref 135–145)

## 2022-12-26 LAB — GLUCOSE, CAPILLARY
Glucose-Capillary: 117 mg/dL — ABNORMAL HIGH (ref 70–99)
Glucose-Capillary: 137 mg/dL — ABNORMAL HIGH (ref 70–99)
Glucose-Capillary: 102 mg/dL — ABNORMAL HIGH (ref 70–99)
Glucose-Capillary: 132 mg/dL — ABNORMAL HIGH (ref 70–99)

## 2022-12-26 MED ORDER — SENNOSIDES-DOCUSATE SODIUM 8.6-50 MG PO TABS
2.0000 | ORAL_TABLET | Freq: Every day | ORAL | Status: DC
  Administered 2022-12-26 – 2022-12-29 (×3): 2 via ORAL
  Filled 2022-12-26 (×4): qty 2

## 2022-12-26 NOTE — Progress Notes (Signed)
Occupational Therapy Session Note  Patient Details  Name: [name] MRN: [id number] Date of Birth: 01/06/1966  Today's Date: 12/26/2022 OT Individual Time: 1345-1430 OT Individual Time Calculation (min): 45 min    Short Term Goals: Week 2:  OT Short Term Goal 1 (Week 2): STGs=LTGs due to patient's length of stay.  Skilled Therapeutic Interventions/Progress Updates:   Pt seen for skilled OT session. Pt upset at 1st part of session that she did not have rollator in room and OT worked to locate. PT then replaced at end of session as it was left in gym last session. Pt transported to ADL apt with demo of use of suction grab bar and shower seat in tub shower. Pt then self propelled to ortho gym 50 ft x 1 for standing scal weight with OT and nursing and able to step up and off scale with close s. Once back in room, stood sink side for brief hair and oral care up to 3 min and then returned w/c level for next ST session with needs and safety measures in reach. Plan for shower tomorrow if able.   Pain: L LE 4/10 with LE elevated on pillow and leg rest   Therapy Documentation Precautions:  Precautions Precautions: Fall Precaution Comments: Life Vest, Falls, Chronic low back and R-knee pain Restrictions Weight Bearing Restrictions: No RUE Weight Bearing: Weight bearing as tolerated   Therapy/Group: Individual Therapy  [name] 12/26/2022, 7:56 AM

## 2022-12-26 NOTE — Progress Notes (Signed)
Nutrition Follow-up  DOCUMENTATION CODES:   Morbid obesity  INTERVENTION:  Liberalize diet to 2 gram sodium diet to provide wider variety of menu options Juven BID to promote wound healing 30 ml ProSource Plus TID, each supplement provides 100 kcals and 15 grams protein.  Vitamin C 500mg  daily MVI with minerals daily  NUTRITION DIAGNOSIS:   Food and nutrition related knowledge deficit related to limited prior education as evidenced by per patient/family report, other (comment) (consult for diet education). - addressed; would benefit from ongoing nutrition education  GOAL:   Patient will meet greater than or equal to 90% of their needs - goal met  MONITOR:   PO intake, Skin, Supplement acceptance, Labs, Weight trends  REASON FOR ASSESSMENT:   Consult Wound healing, Assessment of nutrition requirement/status, Diet education  ASSESSMENT:   57 y/o female admitted 12/05/22 after a witnessed cardiac arrest with bystander CPR, prolonged ACLS protocol x40 minutes, intubated on arrival to ED. Required pressor support, extubated 10/12.   PMH: HTN, NICM with chronic heart failure, morbid obesity, OSA, T2DM, chronic LBP, depression, anxiety, remote smoking hx.  Checked in with pt between therapy sessions. Husband present at bedside though did not participate in dietary recall or history of recent nutritional intake. She endorses having foggy memory and difficulty remembering events throughout admission. Provided therapeutic listening as pt expressed frustrations over concerns of her current medical state and her strong dislike of hospital food. She states that she is eating breakfast later in the morning d/t early therapy sessions. Pt also mentions that she is giving meals to her husband given her dislike of the options.   Meal completions: 100% x 8 recorded meals (10/26-10/30) 10/31: 85% breakfast 11/1: 0% breakfast  Will liberalize diet in an attempt to provide wider variety of menu  options that may be better desired by pt. She reports that she plans to have her husband bring meals from home for her. Agree this would be favorable if it would encourage adequate nutritional intake but also encouraged he provide a balance of nutritional items and try to add nutrient dense foods as able. Discussed different options with pt and her husband.    Question weight documentations throughout admission as there are wide variations daily. Per flowsheet 10/31 weight of 136.4 kg was standing and weight today is bed scale.   Medications:Vitamin C 500mg  daily, farxiga, lasix 40mg  daily, SSI 0-5 units at bedtime, SSI 0-9 units TID, magonate, metformin, Juven BID, miralax, beneprotein TID, senna daily  Labs (10/31):  BUN 22, Cr 1.07, CBG's 102-156 x24 hours  Diet Order:   Diet Order             Diet heart healthy/carb modified Room service appropriate? Yes; Fluid consistency: Thin  Diet effective now                   EDUCATION NEEDS:   Education needs have been addressed  Skin:  Skin Assessment: Skin Integrity Issues: Skin Integrity Issues:: Other (Comment) Other: MARSI medial chest; L forehead laceration; open wound to L leg w/eschar  Last BM:  10/24  Height:   Ht Readings from Last 1 Encounters:  12/18/22 5\' 4"  (1.626 m)    Weight:   Wt Readings from Last 1 Encounters:  12/27/22 (!) 152.4 kg    Ideal Body Weight:  56.8 kg  BMI:  Body mass index is 57.67 kg/m.  Estimated Nutritional Needs:   Kcal:  1500-1700  Protein:  85-100g  Fluid:  >/=1.5L  Drusilla Kanner, RDN, LDN Clinical Nutrition

## 2022-12-26 NOTE — Progress Notes (Signed)
Speech Language Pathology Daily Session Note  Patient Details  Name: [name] MRN: [id number] Date of Birth: Mar 16, 1965  Today's Date: 12/26/2022 SLP Individual Time: 1445-1540 SLP Individual Time Calculation (min): 55 min  Short Term Goals: Week 2: SLP Short Term Goal 1 (Week 2): STGs=LTGs d/t ELOS  Skilled Therapeutic Interventions: SLP conducted skilled therapy session targeting cognitive retraining goals. Patient reports that every small item is overwhelming her today and became tearful several times throughout session describing all of her stressors. Patient reports that making any decisions feels extremely difficulty, with this date being the worst. SLP alerted MD to increase in anxiety, with patient stating she would like to ask MD about increasing anxiety medication. Patient highly tangential and fixated on specific anxious thoughts throughout session, very difficult to redirect. Patient did participate in holiday-themed activity, selecting her favorite group in the costume context. Removing patient from room and assisting with looping patient around the dayroom seemed to assist with calming her mind. SLP and patient discussed benefit of mindfulness meditation with patient verbalizing understanding. Patient fixated throughout session on accessing education network in room; SLP alerted LPN to desire; LPN reportedly contacted IT to submit a request. Patient was left in chair with call bell in reach and chair alarm set. SLP will continue to target goals per plan of care.      Pain Pain Assessment Pain Scale: 0-10 Pain Score: 0-No pain Pain Location: Leg Pain Orientation: Left Pain Radiating Towards: right ribs Pain Intervention(s): Medication (See eMAR)  Therapy/Group: Individual Therapy  Jeannie Done, M.A., CCC-SLP  Yetta Barre 12/26/2022, 3:48 PM

## 2022-12-26 NOTE — Progress Notes (Signed)
PROGRESS NOTE   Subjective/Complaints: Patient continues to have some pain around her left knee with activity.  X-ray knee with evidence of mild OA.  Patient has concerns about her ADHD, she has an outpatient psychiatrist and will discuss medications with this provider after discharge.   ROS: Patient denies fever, headache, abdominal pain, diarrhea, motor or sensory changes, rash. +right sided weakness- improved + chills resolved + Constipation-improved + L knee pain    objective:   DG Abd 2 Views  Result Date: 12/25/2022 CLINICAL DATA:  Constipation EXAM: ABDOMEN - 2 VIEW COMPARISON:  None Available. FINDINGS: The bowel gas pattern is normal. There is moderate stool burden. There are calcifications in the pelvis favored as phleboliths. Surgical clips are seen in the pelvis and right upper quadrant. There is scoliosis and degenerative change of the lumbar spine. IMPRESSION: Nonobstructive bowel gas pattern. Moderate stool burden. Electronically Signed   By: Darliss Cheney M.D.   On: 12/25/2022 21:54   DG Knee Complete 4 Views Left  Result Date: 12/25/2022 CLINICAL DATA:  Pain.  Constipation and knee pain. EXAM: LEFT KNEE - COMPLETE 4+ VIEW COMPARISON:  None Available. FINDINGS: Degenerative changes in the left knee with medial greater than lateral compartment narrowing and small osteophyte formation. No evidence of acute fracture or dislocation. No focal bone lesion or bone destruction. Bone cortex appears intact. No significant effusion. Soft tissues are unremarkable. IMPRESSION: Mild degenerative changes in the left knee. No acute bony abnormalities. Electronically Signed   By: Burman Nieves M.D.   On: 12/25/2022 21:47   Recent Labs    12/26/22 0649  WBC 7.3  HGB 11.7*  HCT 36.1  PLT 303   Recent Labs    12/26/22 0649  NA 136  K 3.5  CL 97*  CO2 29  GLUCOSE 117*  BUN 22*  CREATININE 1.07*  CALCIUM 8.9     Intake/Output Summary (Last 24 hours) at 12/26/2022 1218 Last data filed at 12/25/2022 1300 Gross per 24 hour  Intake 236 ml  Output --  Net 236 ml          Physical Exam: Vital Signs Blood pressure (!) 114/53, pulse 85, temperature 98.6 F (37 C), resp. rate 16, height 5\' 4"  (1.626 m), weight 136.1 kg, SpO2 90%.    General:  No apparent distress, sitting in WC working with therapy HEENT: NCAT, MMM  Tenderness noted at bilateral TMJ, pain with jaw movements Heart: RRR, wearing life vest  Chest: CTAB abdomen: Soft, non-tender, non-distended, bowel sounds positive. Extremities: large [name] wound covered in dressing and ace wrap Psych: Cooperative, a little flat Skin: Clean and intact without signs of breakdown Neuro:  Alert and oriented x3, follows commands, cranial nerves II through XII gorssly intact, no speech or language deficits noted, + memory deficits Sensation intact light touch in all 4 extremities Poor safety awareness.   Strength 4+ out of 5 in left upper extremity Strength 4 out of 5 right upper extremity other then finger flexion 4-/5 Strength 4+ out of 5 right lower extremity Strength 2-3 out of 5 left lower extremity, exam stable 10/27 Musculoskeletal: No joint swelling noted.  Diffuse tenderness throughout right upper extremity  improved she is lifting her arm to gravity without difficulty          Assessment/Plan: 1. Functional deficits which require 3+ hours per day of interdisciplinary therapy in a comprehensive inpatient rehab setting. Physiatrist is providing close team supervision and 24 hour management of active medical problems listed below. Physiatrist and rehab team continue to assess barriers to discharge/monitor patient progress toward functional and medical goals  Care Tool:  Bathing    Body parts bathed by patient: Right arm, Left arm, Chest, Abdomen, Front perineal area, Face   Body parts bathed by helper: Buttocks, Right lower  leg, Right upper leg, Left upper leg Body parts n/a: Left lower leg   Bathing assist Assist Level: Moderate Assistance - Patient 50 - 74%     Upper Body Dressing/Undressing Upper body dressing   What is the patient wearing?: Hospital gown only    Upper body assist Assist Level: Minimal Assistance - Patient > 75%    Lower Body Dressing/Undressing Lower body dressing      What is the patient wearing?: Hospital gown only     Lower body assist Assist for lower body dressing: Minimal Assistance - Patient > 75%     Toileting Toileting    Toileting assist Assist for toileting: Moderate Assistance - Patient 50 - 74%     Transfers Chair/bed transfer  Transfers assist     Chair/bed transfer assist level: Supervision/Verbal cueing     Locomotion Ambulation   Ambulation assist      Assist level: Supervision/Verbal cueing Assistive device: No Device Max distance: 120   Walk 10 feet activity   Assist     Assist level: Supervision/Verbal cueing Assistive device: No Device   Walk 50 feet activity   Assist    Assist level: Supervision/Verbal cueing Assistive device: No Device    Walk 150 feet activity   Assist Walk 150 feet activity did not occur: Safety/medical concerns         Walk 10 feet on uneven surface  activity   Assist Walk 10 feet on uneven surfaces activity did not occur: Safety/medical concerns   Assist level: Contact Guard/Touching assist Assistive device: Rollator   Wheelchair     Assist Is the patient using a wheelchair?: Yes Type of Wheelchair: Manual    Wheelchair assist level: Dependent - Patient 0%      Wheelchair 50 feet with 2 turns activity    Assist        Assist Level: Dependent - Patient 0%   Wheelchair 150 feet activity     Assist      Assist Level: Dependent - Patient 0%   Blood pressure (!) 114/53, pulse 85, temperature 98.6 F (37 C), resp. rate 16, height 5\' 4"  (1.626 m), weight  136.1 kg, SpO2 90%.  Medical Problem List and Plan: 1. 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 Continue life vest              -patient may shower             -ELOS/Goals: 10-14 day,  PT/OT supervision to mod I, SLP mod I              -Continue CIR  -Discussed safety with pt, call nurses to get up.  -11/4 expected discharge   2.  Antithrombotics: -DVT/anticoagulation:  Pharmaceutical: Lovenox             -antiplatelet therapy: N/A 3. Pain Management:  Oxycodone prn  for pain control.   -10/24 increase gabapentin to 200 mg 3 times daily, start lidocaine patch for chest wall  -10/25 increase gabapentin to 300 mg 3 times daily  -10/30 pain controlled, continue current regimen  -10/31 x-ray knee with degenerative changes noted.  Continue medications as above for pain. 4. Mood/Behavior/Sleep: Team to provide ego support. LCSW to follow for evaluation and support.              --Melatonin prn for insomnia.              -antipsychotic agents: N/A at this time              --Neuropsych for coping skills.  5. Neuropsych/cognition: This patient is likely capable of making decisions on her own behalf. 6. Severe left lower extremity wound: wound examined with nursing and provided patient education: Added proteiin supplements and Vitamin C to promote wound healing             --BID dressing changes with silvadene per plastics  -10/30 seen by plastic surgery yesterday, calcium alginate for wound care, Keflex started yesterday for redness possible skin infection in skin surrounding wound- discussed with pharmacy mild penicillin allergy,  she has tolerated cephalosporins   7. Fluids/Electrolytes/Nutrition: Strict I/O. Low salt diet. Juven and prostat added             -- Recheck CMET tomorrow 8. VT/VF arrest: Life Vest with plans for ICD once leg wound resolves?  9. NICM w/ HFrEF:  GDMT being resumed by Cards--on Aldactone, Lasix, Cozaar, Farxiga and Lipitor              --Daily wts and monitor for signs of overload. Strict I/O   -10/28 cardiology has signed off, follow-up appointment outpatient 11/26 and EPP 11/20  -10/29 will ask for standing weights as do not think current weights are accurate.  Her weight went from low 130s kilograms 10/23 and prior days to low 150 kg the following day  -10/30 new wt stable from prior, continue to monitor   -No signs of fluid overload at this time   [facility] Weights   12/23/22 0540 12/24/22 0500 12/24/22 1519  Weight: (!) 153.9 kg (!) 155.3 kg 136.1 kg    10. [name] wound with necrosis: Plastic consulted 10/22-->now on silvadene bid for local care --supplements added to promote wound healing.  11.  T2DM: Hgb A1c-  Was pm metformin/jardiance combo and Monjuaro PTA             --monitor BS ac/hs. AKI has resolved-->resume low dose metformin and titrate as indicated  -10/29 - 31 well-controlled, will hold off on Mounjaro  CBG (last 3)  Recent Labs    12/25/22 1707 12/25/22 2104 12/26/22 0556  GLUCAP 129* 138* 117*    12.  Acute renal failure vs CKD IIIa: Resolving   -BUN and creatinine stable at 22/1.07 13. H/o severe recurrent depression w/anxiety: Vraylar and Lexapro discontinued due to concerns of QT prolongation             --Is being followed by behavioral health/Dr. Elsie Saas. Had [facility] urgent care visit 09/25/22 --continue Xanax prn   14. OSA: Continue CPAP 15. H/o Lumbar spinal stenosis and  R-knee pain:  Resume gabapentin (mood and pain). She was going to outpatient therapy.   16. Morbid Obesity BMI-50.5: Multifactorial. Encourage/educate on life style and diet changes to help with wt loss and improve overall activity and health. .  17. ADD: Discussed need for  activating medication (mood and energy per char review)--has ritalin on home meds but just approved  --Continue Ritalin added per secure chat with Dr. Bjorn Pippin.  18. Vaginal discomfort: Likely due to candidiasis --miconazole added for treatment.  19.   Hyperlipidemia: continue statin  20.  Right upper extremity pain/right shoulder pain  -Will check x-ray of her right shoulder as this seems to be the greatest location of pain at this time.  Suspect musculoskeletal pain due to increased activity.  21.  Bilateral jaw pain.   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  She has pain with movement and tenderness to palpation of her jaw.  Continue voltaren gel  22.  Chills reported yesterday.  Reports this has resolved.  She has been afebrile  -10/28 WBC within normal limits today  23. Hypotension  Losartan dose was decreased to 12.5 mg daily  -10/31 BP controlled     12/26/2022    4:50 AM 12/25/2022    7:19 PM 12/25/2022   12:43 PM  Vitals with BMI  Systolic 114 98 108  Diastolic 53 73 88  Pulse 85 92 89     24. Right arm weakness/pain: -cervical spin XR ordered and discussed that it shows multilevel spondylosis anf facet arthropathy. Attempted to order MRI cervical spine on Saturday but she is unable to have LifeVest off for more than 10 minutes, discussed steroids to reduced swelling in neck but she defers due to risks of worsening infection and weight gain, discussed increase in gabapentin or topamax and she chooses to try topamax, discussed that right arm symptoms improved temporarily with therapy, encouraged continued physical therapy to decompress spine, discussed that putting a lot of pressure while holding the walker could contribute.  -10/30-31 improved, continue to monitor   25. Dry Mouth  -10/28 Discussed with pharmacy, no recent medications that very likely to cause this, will start biotene   26.  Constipation.    -Schedule miralax  -Will order sorbitol 60ml 10/29  -10/30 LBM yesterday but amount not know, continue miralax, will check xray abdomen regarding stool burden  -10/31 moderate stool burden on x-ray yesterday.  She had additional dose of sorbitol yesterday afternoon.  MiraLAX increased to twice daily.  Will add Senokot at  bedtime.       LOS: 9 days A FACE TO FACE EVALUATION WAS PERFORMED  Fanny Dance 12/26/2022, 12:18 PM

## 2022-12-26 NOTE — Progress Notes (Signed)
Stool for occult blood sent to lab

## 2022-12-26 NOTE — Progress Notes (Signed)
Physical Therapy Session Note  Patient Details  Name: [name] MRN: [id number] Date of Birth: 25-Oct-1965  Today's Date: 12/26/2022 PT Individual Time: 8657-8469 PT Individual Time Calculation (min): 57 min   Short Term Goals: Week 2:  PT Short Term Goal 1 (Week 2): STG=LTG 2/2 ELOS  Skilled Therapeutic Interventions/Progress Updates:      Pt seated in WC brushing teeth upon arrival with husband in room. Pt reports feeling as though her knee is dislocated. Upon palpation, no evidence of palpation, pt did not demo any signs and symptoms of dislocation with mobility. Eduction provided on results of x-ray indicating no acute bony abnormalities.   Discussed pt car. Pt reports she has a minivan. Pt transported dependent in WC to [employer]. Pt performed ambulatory transfer x10 feet to car simulator at approximate height of minivan with supervision/mod I, pt demos good technique. Pt life vest alerting, nurse present to adjust contact of pad and change battery.   Pt ascended/descended 12 6 inch steps with B HR and supervision, verbal cues provided initially for ascending with R UE and descending with L LE, pt demos good carryover with remaining 8 steps without cues.   Pt ambulated 70 feet with no AD with supervision, with decreased step length, no LOB noted.   Pt seated in WC at end of session with all needs within reach and L LE elevated on leg rest.         Therapy Documentation Precautions:  Precautions Precautions: Fall Precaution Comments: Life Vest, Falls, Chronic low back and R-knee pain Restrictions Weight Bearing Restrictions: No RUE Weight Bearing: Weight bearing as tolerated  Therapy/Group: Individual Therapy  [facility] Ambrose Finland, [place], DPT  12/26/2022, 7:46 AM

## 2022-12-27 ENCOUNTER — Other Ambulatory Visit: Payer: Self-pay | Admitting: Internal Medicine

## 2022-12-27 DIAGNOSIS — R5381 Other malaise: Secondary | ICD-10-CM | POA: Diagnosis not present

## 2022-12-27 DIAGNOSIS — I959 Hypotension, unspecified: Secondary | ICD-10-CM | POA: Diagnosis not present

## 2022-12-27 DIAGNOSIS — F430 Acute stress reaction: Secondary | ICD-10-CM | POA: Clinically undetermined

## 2022-12-27 DIAGNOSIS — I5022 Chronic systolic (congestive) heart failure: Secondary | ICD-10-CM | POA: Diagnosis not present

## 2022-12-27 DIAGNOSIS — F411 Generalized anxiety disorder: Secondary | ICD-10-CM

## 2022-12-27 DIAGNOSIS — E1169 Type 2 diabetes mellitus with other specified complication: Secondary | ICD-10-CM | POA: Diagnosis not present

## 2022-12-27 DIAGNOSIS — F332 Major depressive disorder, recurrent severe without psychotic features: Secondary | ICD-10-CM

## 2022-12-27 DIAGNOSIS — E785 Hyperlipidemia, unspecified: Secondary | ICD-10-CM

## 2022-12-27 DIAGNOSIS — F4323 Adjustment disorder with mixed anxiety and depressed mood: Secondary | ICD-10-CM | POA: Clinically undetermined

## 2022-12-27 LAB — GLUCOSE, CAPILLARY
Glucose-Capillary: 106 mg/dL — ABNORMAL HIGH (ref 70–99)
Glucose-Capillary: 156 mg/dL — ABNORMAL HIGH (ref 70–99)
Glucose-Capillary: 93 mg/dL (ref 70–99)
Glucose-Capillary: 133 mg/dL — ABNORMAL HIGH (ref 70–99)

## 2022-12-27 MED ORDER — LOSARTAN POTASSIUM 25 MG PO TABS
12.5000 mg | ORAL_TABLET | Freq: Every day | ORAL | 0 refills | Status: DC
  Filled 2022-12-27: qty 30, 60d supply, fill #0

## 2022-12-27 MED ORDER — POLYETHYLENE GLYCOL 3350 17 GM/SCOOP PO POWD
17.0000 g | Freq: Two times a day (BID) | ORAL | 0 refills | Status: DC
  Filled 2022-12-27: qty 238, 7d supply, fill #0

## 2022-12-27 MED ORDER — PROSOURCE PLUS PO LIQD
30.0000 mL | Freq: Three times a day (TID) | ORAL | Status: DC
  Administered 2022-12-27 – 2022-12-30 (×7): 30 mL via ORAL
  Filled 2022-12-27 (×7): qty 30

## 2022-12-27 MED ORDER — SENNOSIDES-DOCUSATE SODIUM 8.6-50 MG PO TABS
2.0000 | ORAL_TABLET | Freq: Every day | ORAL | 0 refills | Status: DC
  Filled 2022-12-27: qty 60, 30d supply, fill #0

## 2022-12-27 MED ORDER — ADULT MULTIVITAMIN W/MINERALS CH
1.0000 | ORAL_TABLET | Freq: Every day | ORAL | 0 refills | Status: DC
  Filled 2022-12-27: qty 30, 30d supply, fill #0

## 2022-12-27 MED ORDER — ACETAMINOPHEN 325 MG PO TABS
325.0000 mg | ORAL_TABLET | ORAL | 0 refills | Status: DC | PRN
  Filled 2022-12-27: qty 100, 9d supply, fill #0

## 2022-12-27 MED ORDER — MAGNESIUM OXIDE 400 MG PO TABS
400.0000 mg | ORAL_TABLET | Freq: Every day | ORAL | 0 refills | Status: DC
  Filled 2022-12-27: qty 30, 30d supply, fill #0

## 2022-12-27 MED ORDER — METHYLPHENIDATE HCL 5 MG PO TABS
5.0000 mg | ORAL_TABLET | Freq: Two times a day (BID) | ORAL | 0 refills | Status: DC
  Filled 2022-12-27: qty 60, 30d supply, fill #0

## 2022-12-27 MED ORDER — GABAPENTIN 300 MG PO CAPS
300.0000 mg | ORAL_CAPSULE | Freq: Three times a day (TID) | ORAL | 0 refills | Status: DC
  Filled 2022-12-27: qty 90, 30d supply, fill #0

## 2022-12-27 MED ORDER — METFORMIN HCL 500 MG PO TABS
250.0000 mg | ORAL_TABLET | Freq: Two times a day (BID) | ORAL | 0 refills | Status: DC
  Filled 2022-12-27: qty 30, 30d supply, fill #0

## 2022-12-27 MED ORDER — ATORVASTATIN CALCIUM 80 MG PO TABS
80.0000 mg | ORAL_TABLET | Freq: Every day | ORAL | 0 refills | Status: DC
  Filled 2022-12-27: qty 30, 30d supply, fill #0

## 2022-12-27 MED ORDER — METOPROLOL SUCCINATE ER 50 MG PO TB24
50.0000 mg | ORAL_TABLET | Freq: Every day | ORAL | 0 refills | Status: DC
  Filled 2022-12-27: qty 30, 30d supply, fill #0

## 2022-12-27 MED ORDER — LIDOCAINE 5 % EX PTCH
2.0000 | MEDICATED_PATCH | Freq: Every day | CUTANEOUS | 0 refills | Status: DC
  Filled 2022-12-27 – 2023-01-24 (×2): qty 60, 30d supply, fill #0

## 2022-12-27 MED ORDER — CEPHALEXIN 500 MG PO CAPS
500.0000 mg | ORAL_CAPSULE | Freq: Three times a day (TID) | ORAL | 0 refills | Status: DC
  Filled 2022-12-27: qty 21, 7d supply, fill #0

## 2022-12-27 MED ORDER — SPIRONOLACTONE 25 MG PO TABS
12.5000 mg | ORAL_TABLET | Freq: Every day | ORAL | 0 refills | Status: DC
  Filled 2022-12-27: qty 30, 60d supply, fill #0

## 2022-12-27 MED ORDER — DICLOFENAC SODIUM 1 % EX GEL
2.0000 g | Freq: Four times a day (QID) | CUTANEOUS | 0 refills | Status: DC
  Filled 2022-12-27: qty 100, 14d supply, fill #0
  Filled 2023-01-24: qty 100, 14d supply, fill #1

## 2022-12-27 MED ORDER — OXYCODONE HCL 10 MG PO TABS
5.0000 mg | ORAL_TABLET | Freq: Four times a day (QID) | ORAL | 0 refills | Status: DC | PRN
  Filled 2022-12-27: qty 15, 4d supply, fill #0

## 2022-12-27 MED ORDER — DAPAGLIFLOZIN PROPANEDIOL 10 MG PO TABS
10.0000 mg | ORAL_TABLET | Freq: Every day | ORAL | 0 refills | Status: DC
  Filled 2022-12-27: qty 30, 30d supply, fill #0

## 2022-12-27 MED ORDER — ALPRAZOLAM 0.5 MG PO TABS
0.5000 mg | ORAL_TABLET | Freq: Two times a day (BID) | ORAL | 0 refills | Status: DC | PRN
  Filled 2022-12-27: qty 30, 15d supply, fill #0

## 2022-12-27 MED ORDER — FUROSEMIDE 40 MG PO TABS
40.0000 mg | ORAL_TABLET | Freq: Every day | ORAL | 0 refills | Status: DC
  Filled 2022-12-27: qty 30, 30d supply, fill #0

## 2022-12-27 MED ORDER — ASCORBIC ACID 500 MG PO TABS
500.0000 mg | ORAL_TABLET | Freq: Every day | ORAL | 0 refills | Status: DC
  Filled 2022-12-27: qty 30, 30d supply, fill #0

## 2022-12-27 MED ORDER — ADULT MULTIVITAMIN W/MINERALS CH
1.0000 | ORAL_TABLET | Freq: Every day | ORAL | Status: DC
  Administered 2022-12-27 – 2022-12-30 (×4): 1 via ORAL
  Filled 2022-12-27 (×4): qty 1

## 2022-12-27 MED ORDER — TOPIRAMATE 25 MG PO TABS
25.0000 mg | ORAL_TABLET | Freq: Every day | ORAL | 0 refills | Status: DC
  Filled 2022-12-27: qty 30, 30d supply, fill #0

## 2022-12-27 NOTE — Progress Notes (Signed)
PROGRESS NOTE   Subjective/Complaints: Patient reports she is having a lot concerns regarding her anxiety and questioning her diagnosis of ADD.  She very much wants to follow-up with Dr. Kieth Brightly neuropsychology in outpatient, we discussed that this may not be possible.  Psychiatry has been consulted.  She has questions regarding timing of her left leg wound healing and ICD timing.  She would also like some cardiac education on video, nursing provided this to patient at the end of my visit.   ROS: Patient denies fever, headache, abdominal pain, diarrhea, motor or sensory changes, rash. +right sided weakness- improved + chills resolved + Constipation-improved + L knee pain +Anxiety   objective:   DG Abd 2 Views  Result Date: 12/25/2022 CLINICAL DATA:  Constipation EXAM: ABDOMEN - 2 VIEW COMPARISON:  None Available. FINDINGS: The bowel gas pattern is normal. There is moderate stool burden. There are calcifications in the pelvis favored as phleboliths. Surgical clips are seen in the pelvis and right upper quadrant. There is scoliosis and degenerative change of the lumbar spine. IMPRESSION: Nonobstructive bowel gas pattern. Moderate stool burden. Electronically Signed   By: Darliss Cheney M.D.   On: 12/25/2022 21:54   DG Knee Complete 4 Views Left  Result Date: 12/25/2022 CLINICAL DATA:  Pain.  Constipation and knee pain. EXAM: LEFT KNEE - COMPLETE 4+ VIEW COMPARISON:  None Available. FINDINGS: Degenerative changes in the left knee with medial greater than lateral compartment narrowing and small osteophyte formation. No evidence of acute fracture or dislocation. No focal bone lesion or bone destruction. Bone cortex appears intact. No significant effusion. Soft tissues are unremarkable. IMPRESSION: Mild degenerative changes in the left knee. No acute bony abnormalities. Electronically Signed   By: Burman Nieves M.D.   On: 12/25/2022 21:47    Recent Labs    12/26/22 0649  WBC 7.3  HGB 11.7*  HCT 36.1  PLT 303   Recent Labs    12/26/22 0649  NA 136  K 3.5  CL 97*  CO2 29  GLUCOSE 117*  BUN 22*  CREATININE 1.07*  CALCIUM 8.9    Intake/Output Summary (Last 24 hours) at 12/27/2022 1514 Last data filed at 12/27/2022 0916 Gross per 24 hour  Intake 0 ml  Output --  Net 0 ml          Physical Exam: Vital Signs Blood pressure 110/68, pulse 85, temperature 98.3 F (36.8 C), resp. rate 19, height 5\' 4"  (1.626 m), weight (!) 152.4 kg, SpO2 93%.    General:  No apparent distress HEENT: NCAT, MMM  Tenderness noted at bilateral TMJ-resolved Heart: RRR, wearing life vest  Chest: CTAB abdomen: Soft, non-tender, non-distended, bowel sounds positive. Extremities: large [name] wound covered in dressing and ace wrap Psych: Cooperative, a little flat Skin: Clean and intact without signs of breakdown Neuro:  Alert and oriented x3, follows commands, cranial nerves II through XII gorssly intact, no speech or language deficits noted, + memory deficits Sensation intact light touch in all 4 extremities Poor safety awareness.   Strength 4+ out of 5 in left upper extremity Strength 4 out of 5 right upper extremity other then finger flexion 4-/5 Strength 4+ out  of 5 right lower extremity Strength 2-3 out of 5 left lower extremity, exam stable 10/27 Musculoskeletal: No joint swelling noted.  Diffuse tenderness throughout right upper extremity improved she is lifting her arm to gravity without difficulty, fluctuating weakness in finger flexion strength          Assessment/Plan: 1. Functional deficits which require 3+ hours per day of interdisciplinary therapy in a comprehensive inpatient rehab setting. Physiatrist is providing close team supervision and 24 hour management of active medical problems listed below. Physiatrist and rehab team continue to assess barriers to discharge/monitor patient progress toward functional  and medical goals  Care Tool:  Bathing    Body parts bathed by patient: Right arm, Left arm, Chest, Abdomen, Front perineal area, Face   Body parts bathed by helper: Buttocks, Right lower leg, Right upper leg, Left upper leg Body parts n/a: Left lower leg   Bathing assist Assist Level: Moderate Assistance - Patient 50 - 74%     Upper Body Dressing/Undressing Upper body dressing   What is the patient wearing?: Hospital gown only    Upper body assist Assist Level: Minimal Assistance - Patient > 75%    Lower Body Dressing/Undressing Lower body dressing      What is the patient wearing?: Hospital gown only     Lower body assist Assist for lower body dressing: Minimal Assistance - Patient > 75%     Toileting Toileting    Toileting assist Assist for toileting: Moderate Assistance - Patient 50 - 74%     Transfers Chair/bed transfer  Transfers assist     Chair/bed transfer assist level: Supervision/Verbal cueing     Locomotion Ambulation   Ambulation assist      Assist level: Supervision/Verbal cueing Assistive device: No Device Max distance: 120   Walk 10 feet activity   Assist     Assist level: Supervision/Verbal cueing Assistive device: No Device   Walk 50 feet activity   Assist    Assist level: Supervision/Verbal cueing Assistive device: No Device    Walk 150 feet activity   Assist Walk 150 feet activity did not occur: Safety/medical concerns         Walk 10 feet on uneven surface  activity   Assist Walk 10 feet on uneven surfaces activity did not occur: Safety/medical concerns   Assist level: Contact Guard/Touching assist Assistive device: Rollator   Wheelchair     Assist Is the patient using a wheelchair?: Yes Type of Wheelchair: Manual    Wheelchair assist level: Dependent - Patient 0%      Wheelchair 50 feet with 2 turns activity    Assist        Assist Level: Dependent - Patient 0%   Wheelchair 150  feet activity     Assist      Assist Level: Dependent - Patient 0%   Blood pressure 110/68, pulse 85, temperature 98.3 F (36.8 C), resp. rate 19, height 5\' 4"  (1.626 m), weight (!) 152.4 kg, SpO2 93%.  Medical Problem List and Plan: 1. 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 Continue life vest              -patient may shower             -ELOS/Goals: 10-14 day,  PT/OT supervision to mod I, SLP mod I              -Continue CIR  -Discussed safety with pt, call nurses to get up.  -  11/4 expected discharge   2.  Antithrombotics: -DVT/anticoagulation:  Pharmaceutical: Lovenox             -antiplatelet therapy: N/A 3. Pain Management:  Oxycodone prn for pain control.   -10/24 increase gabapentin to 200 mg 3 times daily, start lidocaine patch for chest wall  -10/25 increase gabapentin to 300 mg 3 times daily  -10/30 pain controlled, continue current regimen  -10/31 x-ray knee with degenerative changes noted.  Continue medications as above for pain. 4. Mood/Behavior/Sleep: Team to provide ego support. LCSW to follow for evaluation and support.              --Melatonin prn for insomnia.              -antipsychotic agents: N/A at this time              --Neuropsych for coping skills.  5. Neuropsych/cognition: This patient is likely capable of making decisions on her own behalf. 6. Severe left lower extremity wound: wound examined with nursing and provided patient education: Added proteiin supplements and Vitamin C to promote wound healing             --BID dressing changes with silvadene per plastics  -10/30 seen by plastic surgery yesterday, calcium alginate for wound care, Keflex started yesterday for redness possible skin infection in skin surrounding wound- discussed with pharmacy mild penicillin allergy,  she has tolerated cephalosporins    11/1 spoke with Dr. Ladona Ridgel on the phone with plastic surgery, Dr. Ladona Ridgel saw patient yesterday.   Planning to start Santyl for wound care with calcium alginate.  Could consider surgical debridement of the eschar, however concern regarding cardiac risk.  I spoke with EP nurse wound will need to be healed for ICD placement.  She recommended using defibrillator paddles during surgery while LifeVest is off.  Called back plastic surgery who has concerns seeding with surgery without cardiology present.  Provided phone number to contact cardiology back. 7. Fluids/Electrolytes/Nutrition: Strict I/O. Low salt diet. Juven and prostat added             -- Recheck CMET tomorrow 8. VT/VF arrest: Life Vest with plans for ICD once leg wound resolves?  9. NICM w/ HFrEF:  GDMT being resumed by Cards--on Aldactone, Lasix, Cozaar, Farxiga and Lipitor             --Daily wts and monitor for signs of overload. Strict I/O   -10/28 cardiology has signed off, follow-up appointment outpatient 11/26 and EPP 11/20  -10/29 will ask for standing weights as do not think current weights are accurate.  Her weight went from low 130s kilograms 10/23 and prior days to low 150 kg the following day  -10/30 new wt stable from prior, continue to monitor   -No signs of fluid overload at this time  -11/1 weights continue to fluctuate, suspect nursing using bed weight not standing weight as prior days   Filed Weights   12/24/22 1519 12/26/22 1300 12/27/22 0500  Weight: 136.1 kg (!) 136.4 kg (!) 152.4 kg    10. [name] wound with necrosis: Plastic consulted 10/22-->now on silvadene bid for local care --supplements added to promote wound healing.  11.  T2DM: Hgb A1c-  Was pm metformin/jardiance combo and Monjuaro PTA             --monitor BS ac/hs. AKI has resolved-->resume low dose metformin and titrate as indicated  -11/1 CBGs controlled, hold off on Mounjaro  CBG (last 3)  Recent  Labs    12/26/22 2118 12/27/22 0903 12/27/22 1140  GLUCAP 132* 106* 156*    12.  Acute renal failure vs CKD IIIa: Resolving   -BUN and creatinine  stable at 22/1.07 13. H/o severe recurrent depression w/anxiety: Vraylar and Lexapro discontinued due to concerns of QT prolongation             --Is being followed by behavioral health/Dr. Elsie Saas. Had [facility] urgent care visit 09/25/22 --continue Xanax prn   -11/1 psychiatry consulted,Vraylar and Lexapro were stopped due to concerns of QT prolongation previously discussed this with patient 14. OSA: Continue CPAP 15. H/o Lumbar spinal stenosis and  R-knee pain:  Resume gabapentin (mood and pain). She was going to outpatient therapy.   16. Morbid Obesity BMI-50.5: Multifactorial. Encourage/educate on life style and diet changes to help with wt loss and improve overall activity and health. .  17. ADD: Discussed need for activating medication (mood and energy per char review)--has ritalin on home meds but just approved  --Continue Ritalin added per secure chat with Dr. Bjorn Pippin.  18. Vaginal discomfort: Likely due to candidiasis --miconazole added for treatment.  19.  Hyperlipidemia: continue statin  20.  Right upper extremity pain/right shoulder pain  -Will check x-ray of her right shoulder as this seems to be the greatest location of pain at this time.  Suspect musculoskeletal pain due to increased activity.  21.  Bilateral jaw pain.   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  She has pain with movement and tenderness to palpation of her jaw.  Continue voltaren gel  22.  Chills reported yesterday.  Reports this has resolved.  She has been afebrile  -10/28 WBC within normal limits today  23. Hypotension  Losartan dose was decreased to 12.5 mg daily  -11/1 BP control     12/27/2022    5:26 AM 12/27/2022    5:00 AM 12/26/2022    8:11 PM  Vitals with BMI  Weight  336 lbs   BMI  57.65   Systolic 110  102  Diastolic 68  69  Pulse 85  89     24. Right arm weakness/pain: -cervical spin XR ordered and discussed that it shows multilevel spondylosis anf facet arthropathy. Attempted to order MRI cervical spine on Saturday  but she is unable to have LifeVest off for more than 10 minutes, discussed steroids to reduced swelling in neck but she defers due to risks of worsening infection and weight gain, discussed increase in gabapentin or topamax and she chooses to try topamax, discussed that right arm symptoms improved temporarily with therapy, encouraged continued physical therapy to decompress spine, discussed that putting a lot of pressure while holding the walker could contribute.  -10/30-11/1improved, continue to monitor   25. Dry Mouth  -10/28 Discussed with pharmacy, no recent medications that very likely to cause this, will start biotene   26.  Constipation.    -Schedule miralax  -Will order sorbitol 60ml 10/29  -10/30 LBM yesterday but amount not know, continue miralax, will check xray abdomen regarding stool burden  -10/31 moderate stool burden on x-ray yesterday.  She had additional dose of sorbitol yesterday afternoon.  MiraLAX increased to twice daily.  Will add Senokot at bedtime.  -11/1 continue additional medication if no BM today       LOS: 10 days A FACE TO FACE EVALUATION WAS PERFORMED  Fanny Dance 12/27/2022, 3:14 PM

## 2022-12-27 NOTE — Progress Notes (Signed)
Physical Therapy Session Note  Patient Details  Name: [name] MRN: [id number] Date of Birth: Aug 07, 1965  Today's Date: 12/27/2022 PT Individual Time: 0915-1015 PT Individual Time Calculation (min): 60 min  and Today's Date: 12/27/2022 PT Missed Time: 15 Minutes Missed Time Reason: Other (Comment)  Short Term Goals: Week 2:  PT Short Term Goal 1 (Week 2): STG=LTG 2/2 ELOS  Skilled Therapeutic Interventions/Progress Updates:    Pt seated in WC upon arrival. Pt upset because she thought she would be taking a shower with OT this morning, and she intentionally didn't get dressed or brush her hair. Pt missed 15 minutes of skilled PT as pt brushing hair, teeth, and donning clothes with A from husband, and refusing therapist to be in room.   Pt life vest alarming, husband demos recall of technique for adjusting pads, and changing battery. Nurse present to administer medications.   Pt requires increased time and encouragement throughout session 2/2 anxiety.   Therapist offered to complete session outside to assist with pt anxiety. Pt [facility] dependent in [facility] for time/energy conservation.   Pt husband present throughout session for family training for stair navigation for home entry. Therapist provided demonstration of curb step navigation with rollator for home entry. Pt performed with supervision with therapist managing rollator, verbal cues provided to pt for sequencing. Pt and pt husband returned demonstration of curb step navigation with rollator, with husband, with pt reiterating cues  and managing Rollator.   Pt lift vest alarming to indicate poor contact, attempted to correct while outside, but therapist opted to return to room 2/2 pt significant increase in anxiety.   Pt stood with no AD with supervision while pads and belt were adjusted.  Pt then perserverating about not being able to access educational videos on tv. Therapist attempted to access but unable. Discussed educational  binder with educational handout specific about pt diagnosis and impairments. Pt reports needing visual educational material. Notified case Production designer, theatre/television/film. Case manager present to assist with access to educational material.            Therapy Documentation Precautions:  Precautions Precautions: Fall Precaution Comments: Life Vest, Falls, Chronic low back and R-knee pain Restrictions Weight Bearing Restrictions: No RUE Weight Bearing: Weight bearing as tolerated  Therapy/Group: Individual Therapy  [facility] [place], [place], DPT  12/27/2022, 7:53 AM

## 2022-12-27 NOTE — Progress Notes (Signed)
Occupational Therapy Session Note  Patient Details  Name: [name] MRN: [id number] Date of Birth: May 02, 1965  Today's Date: 12/27/2022 OT Individual Time: 7829-5621 OT Individual Time Calculation (min): 59 min    Short Term Goals: Week 2:  OT Short Term Goal 1 (Week 2): STGs=LTGs due to patient's length of stay.  Skilled Therapeutic Interventions/Progress Updates:    Pt seen for skilled OT sesison. Husband bedside and pt eager for shower for 1st time. OT set all space up for waterproofing and ease with Life Vest removal only allowed for 10 min. OT waterprooofed L LE to inner thigh. OT transported pt in and out of stall shower for energy conservation. PT able to perform SPT in and out of stall with shower seat with close S. Pt able to dooff Life vest and handed to husband to change batteries while bathing. OT provided min A for peri and buttocks region and pt able to bathe and wash hair with set up seated. Once out of shower pt and husband reapplied Life Vest successfully and pt dressed pullover dress with set up and waterproofing removed and L LE placed on legress with non slip sock and slipper with OT donning with dep level on L due to wound wrapping. Pt able to manage grooming with set up. Left pt with all needs, nurse call button and chair alarm with husband bedside.   Pain: L LE 4/10 with certain movement with repositioning and elevation for relief   Therapy Documentation Precautions:  Precautions Precautions: Fall Precaution Comments: Life Vest, Falls, Chronic low back and R-knee pain Restrictions Weight Bearing Restrictions: No RUE Weight Bearing: Weight bearing as tolerated General: General PT Missed Treatment Reason:  (conversing with MD and other staff)   Therapy/Group: Individual Therapy  [name] 12/27/2022, 1:36 PM

## 2022-12-27 NOTE — Progress Notes (Signed)
I spent an hour with Ms [name] and her husband yesterday evening. I had the opportunity to examine her left lower extremity wound and to discussed the management with both of them.  The wound itself has a thick eschar extending from the knee to the mid pretibial region.  On examination and palpation there is no evidence that there is any infection in the wound.  The eschar is clearly dead, nonviable skin however it is providing a biologic dressing at this time.  I discussed with [name] and her husband in any other situation I would take her to the operating room and remove the eschar and replace it with a tissue matrix.  In her situation, I am not sure that the benefits of surgery outweigh the risk of surgery.  As long as there is no infection, the eschar can be left in place and allow the wound to begin to granulate and reepithelialized on its own.  If the wound prevented her from receiving necessary care of this could of course be revisited but would require the consent and involvement of her cardiology team.  In our conversation last night we left the plan as it currently is.  When she is discharged from the hospital she will have close follow-up in my office.  She will have a follow-up appointment within the first 2 weeks of her discharge and then she will be seen every week thereafter until clear wound healing has begun.  We will change her current wound care from [facility] to [facility] which is a gentle enzymatic agent.  She will continue with the calcium alginate to help with any drainage.  Dressing should be changed twice daily and if soiled.

## 2022-12-27 NOTE — Plan of Care (Signed)
  Problem: RH Car Transfers Goal: LTG Patient will perform car transfers with assist (PT) Description: LTG: Patient will perform car transfers with assistance (PT). Flowsheets (Taken 12/27/2022 0754) LTG: Pt will perform car transfers with assist:: (upgraded 2/2 pt progressing quicker than anticipated) Supervision/Verbal cueing
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
## 2022-12-27 NOTE — Discharge Instructions (Addendum)
   Inpatient Rehab Discharge Instructions  [name] Discharge date and time:  12/30/22  Activities/Precautions/ Functional Status: Activity: no lifting, driving, or strenuous exercise till cleared by MD Diet: cardiac diet and diabetic diet Wound Care: Cleanse with normal saline, apply santyl then cover the wound with calcium alginate, cover with 4X4 and ABD pads.   Functional status:  ___ No restrictions     ___ Walk up steps independently _X__ 24/7 supervision/assistance   ___ Walk up steps with assistance ___ Intermittent supervision/assistance  ___ Bathe/dress independently ___ Walk with walker     _X__ Bathe/dress with assistance ___ Walk Independently    ___ Shower independently ___ Walk with assistance    ___ Shower with assistance _X__ No alcohol     ___ Return to work/school ________   Special Instructions: Can purchase Juven/Prostat or other protein supplements that are low carb. Need to add this to diet at least twice a day 2. Also can add Vitamin C and Zinc once a day to promote wound healing.   Hospital follow-up with General Cardiology Marjie Skiff) has been scheduduled for 01/21/2023 at 11:20am. Please arrive 15 minutes early for check-in. If this date/ time does not work for you, please call our office to reschedule.   COMMUNITY REFERRALS UPON DISCHARGE:    Home Health:   PT,  OT  RN  SP                Agency: [facility]   Phone:  [phone]  Medical Equipment/Items Ordered: BARIATRIC DROP-ARM BEDSIDE COMMODE AND BARIATRIC TUB BENCH                                                 Agency/Supplier:[facility]   [phone]    My questions have been answered and I understand these instructions. I will adhere to these goals and the provided educational materials after my discharge from the hospital.  Patient/Caregiver Signature _______________________________ Date __________  Clinician Signature _______________________________________ Date  __________  Please bring this form and your medication list with you to all your follow-up doctor's appointments.

## 2022-12-27 NOTE — Progress Notes (Signed)
Physical Therapy Session Note  Patient Details  Name: [name] MRN: [id number] Date of Birth: 1965-08-21  Today's Date: 12/27/2022 PT Individual Time: 1122-1133 PT Individual Time Calculation (min): 11 min  Today's Date: 12/27/2022 PT Missed Time:   Missed Time Reason:  (conversing with MD and other staff)  Short Term Goals: Week 2:  PT Short Term Goal 1 (Week 2): STG=LTG 2/2 ELOS  Skilled Therapeutic Interventions/Progress Updates: Patient sitting in [facility] with husband present on entrance to room. Patient alert and agreeable to PT session.   Patient conversing with MD and missed minutes do to that and other staff. Pt with reports of feeling like L knee is going to "pop out of socket" with MD being already made aware. Pt reported some pain in L knee (unrated). Pt stated that she is still increasing WB on rollator when ambulating due to fear. Small session focused on ambulation without AD. Pt ambulated from room to nsg station with L HHA and husband following in WC. Pt with little to no WB in L UE. Pt cued to breathe through nose instead of solely through mouth to decrease HR and increase oxygenation for endurance. Pt made it to nsg station with decreased cadence, step length and clearance. Pt reported that breathing through nose out exhaling out mouth helped distract from fear and pain. Pt transported back to room in [facility] and reported 7/10 pain (tolerable) in L knee. PTA encouraged pt to decrease distance next time if the pain doesn't decrease shortly after sitting to maintain tolerance. Pt understood.   Patient sitting in WC at end of session with brakes locked, husband present and all needs within reach.      Therapy Documentation Precautions:  Precautions Precautions: Fall Precaution Comments: Life Vest, Falls, Chronic low back and R-knee pain Restrictions Weight Bearing Restrictions: No RUE Weight Bearing: Weight bearing as tolerated  Therapy/Group: Individual Therapy  Flara Storti PTA 12/27/2022, 12:48 PM

## 2022-12-28 ENCOUNTER — Other Ambulatory Visit (HOSPITAL_COMMUNITY): Payer: Self-pay

## 2022-12-28 DIAGNOSIS — R5381 Other malaise: Secondary | ICD-10-CM | POA: Diagnosis not present

## 2022-12-28 LAB — GLUCOSE, CAPILLARY
Glucose-Capillary: 117 mg/dL — ABNORMAL HIGH (ref 70–99)
Glucose-Capillary: 154 mg/dL — ABNORMAL HIGH (ref 70–99)
Glucose-Capillary: 96 mg/dL (ref 70–99)
Glucose-Capillary: 132 mg/dL — ABNORMAL HIGH (ref 70–99)

## 2022-12-28 LAB — VITAMIN D 25 HYDROXY (VIT D DEFICIENCY, FRACTURES): Vit D, 25-Hydroxy: 17.23 ng/mL — ABNORMAL LOW (ref 30–100)

## 2022-12-28 MED ORDER — SORBITOL 70 % SOLN: 30.0000 mL | Freq: Every day | Status: DC | PRN

## 2022-12-28 MED ORDER — OXYCODONE HCL 5 MG PO TABS
10.0000 mg | ORAL_TABLET | Freq: Once | ORAL | Status: AC
  Administered 2022-12-28: 10 mg via ORAL
  Filled 2022-12-28: qty 2

## 2022-12-28 MED ORDER — COLLAGENASE 250 UNIT/GM EX OINT
TOPICAL_OINTMENT | Freq: Every day | CUTANEOUS | Status: DC
  Administered 2022-12-29: 1 via TOPICAL
  Filled 2022-12-28: qty 30
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
## 2022-12-28 NOTE — Progress Notes (Signed)
Patient was in therapy when rating pain.

## 2022-12-28 NOTE — Progress Notes (Signed)
Occupational Therapy Session Note  Patient Details  Name: [name] MRN: [id number] Date of Birth: 1965-06-20  Today's Date: 12/28/2022 OT Individual Time: 4098-1191 OT Individual Time Calculation (min): 42 min    Short Term Goals: Week 2:  OT Short Term Goal 1 (Week 2): STGs=LTGs due to patient's length of stay.  Skilled Therapeutic Interventions/Progress Updates:  Pt received sitting in [facility] for skilled OT session with focus on general conditioning. Pt agreeable to interventions, demonstrating overall pleasant mood. Pt with un-rated pain in [name] and low back,OT offering intermediate rest breaks and positioning suggestions throughout session to address pain/fatigue and maximize participation/safety in session.   Pt performs all STS transfers with supervision+cuing for safe AD management. Pt ambulates from room<>main therapy gym with supervision + rollator, continuing to share complaints of B-shoulder stiffness due to intensity of pushing through arms.   In main therapy gym, pt instructed in series of UE strengthening exercises to improve  -Horizontal Ab/adduction -Shoulder Flexion/Extension -Punches   Pt completes 2 set/5 reps of each exercise with yellow theraband, requiring multi-modal cuing for correct form.    Pt remained sitting in [facility] with all immediate needs met at end of session. Pt continues to be appropriate for skilled OT intervention to promote further functional independence.   Therapy Documentation Precautions:  Precautions Precautions: Fall Precaution Comments: Life Vest, Falls, Chronic low back and R-knee pain Restrictions Weight Bearing Restrictions: No RUE Weight Bearing: Weight bearing as tolerated   Therapy/Group: Individual Therapy  Lou Cal, OTR/L, MSOT  12/28/2022, 5:10 AM

## 2022-12-28 NOTE — Plan of Care (Signed)
  Problem: RH SKIN INTEGRITY Goal: RH STG SKIN FREE OF INFECTION/BREAKDOWN Description: Manage w min assist Outcome: Progressing Goal: RH STG MAINTAIN SKIN INTEGRITY WITH ASSISTANCE Description: STG Maintain Skin Integrity With min Assistance. Outcome: Progressing Goal: RH STG ABLE TO PERFORM INCISION/WOUND CARE W/ASSISTANCE Description: STG Able To Perform Incision/Wound Care With min Assistance. Outcome: Progressing

## 2022-12-28 NOTE — Progress Notes (Signed)
PROGRESS NOTE   Subjective/Complaints:  Pt had a lot of concerns- about pain issues- wants pain meds increased- breathing OK [name] was in pain because dressing too tight, but upset that was hurting-of course.  Said LBM- tiny BM;s not really going much- said had sorbitol "sweet shot"- that helped, but also got something "red" that didn't work and didn't like the taste of it.  R chest soreness- bothering her as well.  Says interferes with life vest.      ROS:   Pt denies SOB, abd pain, CP, N/V/C/D, and vision changes  +right sided weakness- improved + chills resolved + Constipation-worse + L knee pain +Anxiety   objective:   No results found. Recent Labs    12/26/22 0649  WBC 7.3  HGB 11.7*  HCT 36.1  PLT 303   Recent Labs    12/26/22 0649  NA 136  K 3.5  CL 97*  CO2 29  GLUCOSE 117*  BUN 22*  CREATININE 1.07*  CALCIUM 8.9    Intake/Output Summary (Last 24 hours) at 12/28/2022 1054 Last data filed at 12/27/2022 1500 Gross per 24 hour  Intake 180 ml  Output --  Net 180 ml          Physical Exam: Vital Signs Blood pressure (!) 91/48, pulse 80, temperature 98.5 F (36.9 C), resp. rate 16, height 5\' 4"  (1.626 m), weight (!) 152.4 kg, SpO2 94%.      General: awake, alert, appropriate, NAD HENT: conjugate gaze; oropharynx moist CV: regular rate and rhythm;  no JVD Pulmonary: CTA B/L; no W/R/R- good air movement GI: soft, NT, ND, (+)BS- protuberant-  Psychiatric: appropriate- anxiety really noted this AM- perseverative on pain this AM  Neurological: Ox3  Neuro:  Alert and oriented x3, follows commands, cranial nerves II through XII gorssly intact, no speech or language deficits noted, + memory deficits Sensation intact light touch in all 4 extremities Poor safety awareness.   Strength 4+ out of 5 in left upper extremity Strength 4 out of 5 right upper extremity other then finger flexion  4-/5 Strength 4+ out of 5 right lower extremity Strength 2-3 out of 5 left lower extremity, exam stable 10/27 Musculoskeletal: No joint swelling noted.  Diffuse tenderness throughout right upper extremity improved she is lifting her arm to gravity without difficulty, fluctuating weakness in finger flexion strength          Assessment/Plan: 1. Functional deficits which require 3+ hours per day of interdisciplinary therapy in a comprehensive inpatient rehab setting. Physiatrist is providing close team supervision and 24 hour management of active medical problems listed below. Physiatrist and rehab team continue to assess barriers to discharge/monitor patient progress toward functional and medical goals  Care Tool:  Bathing    Body parts bathed by patient: Right arm, Left arm, Chest, Abdomen, Front perineal area, Face   Body parts bathed by helper: Buttocks, Right lower leg, Right upper leg, Left upper leg Body parts n/a: Left lower leg   Bathing assist Assist Level: Moderate Assistance - Patient 50 - 74%     Upper Body Dressing/Undressing Upper body dressing   What is the patient wearing?: Hospital gown only  Upper body assist Assist Level: Minimal Assistance - Patient > 75%    Lower Body Dressing/Undressing Lower body dressing      What is the patient wearing?: Hospital gown only     Lower body assist Assist for lower body dressing: Minimal Assistance - Patient > 75%     Toileting Toileting    Toileting assist Assist for toileting: Moderate Assistance - Patient 50 - 74%     Transfers Chair/bed transfer  Transfers assist     Chair/bed transfer assist level: Supervision/Verbal cueing     Locomotion Ambulation   Ambulation assist      Assist level: Supervision/Verbal cueing Assistive device: No Device Max distance: 120   Walk 10 feet activity   Assist     Assist level: Supervision/Verbal cueing Assistive device: No Device   Walk 50 feet  activity   Assist    Assist level: Supervision/Verbal cueing Assistive device: No Device    Walk 150 feet activity   Assist Walk 150 feet activity did not occur: Safety/medical concerns         Walk 10 feet on uneven surface  activity   Assist Walk 10 feet on uneven surfaces activity did not occur: Safety/medical concerns   Assist level: Contact Guard/Touching assist Assistive device: Rollator   Wheelchair     Assist Is the patient using a wheelchair?: Yes Type of Wheelchair: Manual    Wheelchair assist level: Dependent - Patient 0%      Wheelchair 50 feet with 2 turns activity    Assist        Assist Level: Dependent - Patient 0%   Wheelchair 150 feet activity     Assist      Assist Level: Dependent - Patient 0%   Blood pressure (!) 91/48, pulse 80, temperature 98.5 F (36.9 C), resp. rate 16, height 5\' 4"  (1.626 m), weight (!) 152.4 kg, SpO2 94%.  Medical Problem List and Plan: 1. 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 Continue life vest              -patient may shower             -ELOS/Goals: 10-14 day,  PT/OT supervision to mod I, SLP mod I              -Con't CIR PT, OT and SLP  -Discussed safety with pt, call nurses to get up.  -11/4 expected discharge    2.  Antithrombotics: -DVT/anticoagulation:  Pharmaceutical: Lovenox             -antiplatelet therapy: N/A 3. Pain Management:  Oxycodone prn for pain control.   -10/24 increase gabapentin to 200 mg 3 times daily, start lidocaine patch for chest wall  -10/25 increase gabapentin to 300 mg 3 times daily  -10/30 pain controlled, continue current regimen  -10/31 x-ray knee with degenerative changes noted.  Continue medications as above for pain.  11/2- Feels like pain comes and goes, fluctuates, but wants more pain meds- d/w pt that cannot increase at this time- can let primary team address on Monday.  4. Mood/Behavior/Sleep: Team to  provide ego support. LCSW to follow for evaluation and support.              --Melatonin prn for insomnia.              -antipsychotic agents: N/A at this time              --Neuropsych for  coping skills.  5. Neuropsych/cognition: This patient is likely capable of making decisions on her own behalf. 6. Severe left lower extremity wound: wound examined with nursing and provided patient education: Added proteiin supplements and Vitamin C to promote wound healing             --BID dressing changes with silvadene per plastics  -10/30 seen by plastic surgery yesterday, calcium alginate for wound care, Keflex started yesterday for redness possible skin infection in skin surrounding wound- discussed with pharmacy mild penicillin allergy,  she has tolerated cephalosporins    11/1 spoke with Dr. Ladona Ridgel on the phone with plastic surgery, Dr. Ladona Ridgel saw patient yesterday.  Planning to start Santyl for wound care with calcium alginate.  Could consider surgical debridement of the eschar, however concern regarding cardiac risk.  I spoke with EP nurse wound will need to be healed for ICD placement.  She recommended using defibrillator paddles during surgery while LifeVest is off.  Called back plastic surgery who has concerns seeding with surgery without cardiology present.  Provided phone number to contact cardiology back. 7. Fluids/Electrolytes/Nutrition: Strict I/O. Low salt diet. Juven and prostat added             -- Recheck CMET tomorrow 8. VT/VF arrest: Life Vest with plans for ICD once leg wound resolves?  9. NICM w/ HFrEF:  GDMT being resumed by Cards--on Aldactone, Lasix, Cozaar, Farxiga and Lipitor             --Daily wts and monitor for signs of overload. Strict I/O   -10/28 cardiology has signed off, follow-up appointment outpatient 11/26 and EPP 11/20  -10/29 will ask for standing weights as do not think current weights are accurate.  Her weight went from low 130s kilograms 10/23 and prior days to low  150 kg the following day  -10/30 new wt stable from prior, continue to monitor   -No signs of fluid overload at this time  -11/1 weights continue to fluctuate, suspect nursing using bed weight not standing weight as prior days  11/2- Stable compared to yesterday Filed Weights   12/26/22 1300 12/27/22 0500 12/28/22 0500  Weight: (!) 136.4 kg (!) 152.4 kg (!) 152.4 kg    10. [name] wound with necrosis: Plastic consulted 10/22-->now on silvadene bid for local care --supplements added to promote wound healing.  11.  T2DM: Hgb A1c-  Was pm metformin/jardiance combo and Monjuaro PTA             --monitor BS ac/hs. AKI has resolved-->resume low dose metformin and titrate as indicated  -11/1 CBGs controlled, hold off on Mounjaro  CBG (last 3)  Recent Labs    12/27/22 1635 12/27/22 2106 12/28/22 0612  GLUCAP 93 133* 117*   11/2- Bgs controlled- con't regimen  12.  Acute renal failure vs CKD IIIa: Resolving   -BUN and creatinine stable at 22/1.07 13. H/o severe recurrent depression w/anxiety: Vraylar and Lexapro discontinued due to concerns of QT prolongation             --Is being followed by behavioral health/Dr. Elsie Saas. Had [facility] urgent care visit 09/25/22 --continue Xanax prn   -11/1 psychiatry consulted,Vraylar and Lexapro were stopped due to concerns of QT prolongation previously discussed this with patient 14. OSA: Continue CPAP 15. H/o Lumbar spinal stenosis and  R-knee pain:  Resume gabapentin (mood and pain). She was going to outpatient therapy.   16. Morbid Obesity BMI-50.5: Multifactorial. Encourage/educate on life style and diet changes to help  with wt loss and improve overall activity and health. .  17. ADD: Discussed need for activating medication (mood and energy per char review)--has ritalin on home meds but just approved  --Continue Ritalin added per secure chat with Dr. Bjorn Pippin.  18. Vaginal discomfort: Likely due to candidiasis --miconazole added for treatment.  19.   Hyperlipidemia: continue statin  20.  Right upper extremity pain/right shoulder pain  -Will check x-ray of her right shoulder as this seems to be the greatest location of pain at this time.  Suspect musculoskeletal pain due to increased activity.  21.  Bilateral jaw pain.   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  She has pain with movement and tenderness to palpation of her jaw.  Continue voltaren gel  22.  Chills reported yesterday.  Reports this has resolved.  She has been afebrile  -10/28 WBC within normal limits today  23. Hypotension  Losartan dose was decreased to 12.5 mg daily  -11/1 BP control  11/2- BP on low side 90s/50s- so fa,r wasn't dizzy, so will monitor.     12/28/2022    5:48 AM 12/28/2022    5:00 AM 12/27/2022    7:44 PM  Vitals with BMI  Weight  336 lbs   BMI  57.65   Systolic 91  93  Diastolic 48  64  Pulse 80  83     24. Right arm weakness/pain: -cervical spin XR ordered and discussed that it shows multilevel spondylosis anf facet arthropathy. Attempted to order MRI cervical spine on Saturday but she is unable to have LifeVest off for more than 10 minutes, discussed steroids to reduced swelling in neck but she defers due to risks of worsening infection and weight gain, discussed increase in gabapentin or topamax and she chooses to try topamax, discussed that right arm symptoms improved temporarily with therapy, encouraged continued physical therapy to decompress spine, discussed that putting a lot of pressure while holding the walker could contribute.  -10/30-11/1improved, continue to monitor   11/2- asking for more pain meds,  25. Dry Mouth  -10/28 Discussed with pharmacy, no recent medications that very likely to cause this, will start biotene   11/2- still having dry mouth, but just woke up 26.  Constipation.    -Schedule miralax  -Will order sorbitol 60ml 10/29  -10/30 LBM yesterday but amount not know, continue miralax, will check xray abdomen regarding stool burden  -10/31  moderate stool burden on x-ray yesterday.  She had additional dose of sorbitol yesterday afternoon.  MiraLAX increased to twice daily.  Will add Senokot at bedtime.  -11/1 continue additional medication if no BM today  11/2- having "tiny BM's"- will add Sorbitol as needed daily prn- so can use if need be for bowels.   I spent a total of 36   minutes on total care today- >50% coordination of care- due to in room for 25 minutes listening to pt concerns- then d/w nursing and review of chart- cannot make pain meds - will leave for primary team.       LOS: 11 days A FACE TO FACE EVALUATION WAS PERFORMED  [name] 12/28/2022, 10:54 AM

## 2022-12-28 NOTE — Progress Notes (Signed)
Physical Therapy Session Note  Patient Details  Name: Aalani Aikens MRN: [id number] Date of Birth: [birth year]  Today's Date: 12/28/2022 PT Individual Time: 1417-1505 PT Individual Time Calculation (min): 48 min   Short Term Goals: Week 2:  PT Short Term Goal 1 (Week 2): STG=LTG 2/2 ELOS  Skilled Therapeutic Interventions/Progress Updates:    Pt received sitting in w/c with her husband, [name], present throughout session for education/training and pt agreeable to therapy session. At beginning of session when discussing D/C, pt becomes emotional and tearful expressing her concerns regarding feeling "overwhelmed" and unable to remember the education/training she has been provided from many different providers due to her ADD and difficulty attending to their education as well as her overall increased anxiety levels with the amount of information. Pt specifically states the lifevest video educates on various potential medical diagnoses and it scared her because she cannot recall which diagnosis she has and she is concerned about her diagnosis and the unknown of what might happen. Therapist educated pt to follow-up with MD/PA about medical diagnosis education. Therapist provided emotional support and encouragement on prioritizing the top 3 items she needs to remember and care for at D/C to help with managing her feelings of anxiety and being overwhelmed.  Sit<>stands using rollator supervision/mod-I during session - pt often doesn't lock AD before sitting but is safe with it every time.  Gait training ~122ft x2 to/from gym using rollator with distant supervision progressing to mod-I. On the way back, pt reports increasing low back pain and therefore demos slower gait speed.  Therapist provided visual demonstration to reinforce proper sequencing of curb step navigation for home entry using rollator and husband providing dependent assist to manage AD. Pt stepped on/off 8" step using rollator x2 reps with  husband managing AD and pt safely stepping up/down with distant supervision and min verbal cuing for proper sequencing of LEs.  Stair navigation training ascending/descending 8 steps (6" height) using B HRs with distant supervision - requires min cuing to recall proper sequencing of step-to pattern ascending with R LE and descending with L LE due to increased L LE pain at this time. Pt educated that husband would manage taking AD up/down the steps.  OT requests for PT to review gait in/out of bathroom since pt's AD will not fit in her bathroom at home. In ADL apartment bathroom, therapist educated on parking rollator outside and then using wall/windowsill as support to simulate wall/sink in her bathroom at home and pt ambulated in/out with distant supervision progressing to mod-I.   Pt inquiring about grabbars in shower, but deferred to OT.  At end of session, pt left seated in w/c with needs in reach and her husband present.    Therapy Documentation Precautions:  Precautions Precautions: Fall Precaution Comments: Life Vest, Falls, Chronic low back and R-knee pain Restrictions Weight Bearing Restrictions: No RUE Weight Bearing: Weight bearing as tolerated   Pain:  Reports unrated L LE pain, requests to get medications at end of session - nurse notified - provided seated rest breaks and distraction/emotional support for pain management during session.    Therapy/Group: Individual Therapy  Ginny Forth , PT, DPT, NCS, CSRS 12/28/2022, 3:08 PM

## 2022-12-28 NOTE — Progress Notes (Signed)
Physical Therapy Session Note  Patient Details  Name: [name] MRN: [id number] Date of Birth: 1966-01-13  Today's Date: 12/28/2022 PT Individual Time: 1000-1045 PT Individual Time Calculation (min): 45 min   Short Term Goals: Week 1:  PT Short Term Goal 1 (Week 1): pt will ambulate 150 ft CGA PT Short Term Goal 1 - Progress (Week 1): Progressing toward goal PT Short Term Goal 2 (Week 1): pt will perform STS w/ supervision PT Short Term Goal 2 - Progress (Week 1): Met PT Short Term Goal 3 (Week 1): pt will perform bed mobility w/ minA PT Short Term Goal 3 - Progress (Week 1): Met PT Short Term Goal 4 (Week 1): pt will ambulate 8 stairs CGA PT Short Term Goal 4 - Progress (Week 1): Met PT Short Term Goal 5 (Week 1): pt will perform bed to chair transfer supervision PT Short Term Goal 5 - Progress (Week 1): Met  Skilled Therapeutic Interventions/Progress Updates:  Pt was seen bedside in the am. Pt transferred sit to stand and stand pivot multiple times with S and verbal cues. Pt ambulated 100, 80 x 2 and 100 feet with rollator and S. Adjusted height of rollator with improved tolerance and comfort. Pt returned to room following treatment and left sitting up in w/c with all needs within reach.   Therapy Documentation Precautions:  Precautions Precautions: Fall Precaution Comments: Life Vest, Falls, Chronic low back and R-knee pain Restrictions Weight Bearing Restrictions: No RUE Weight Bearing: Weight bearing as tolerated General:   Pain: Pt c/o pain L leg chronic knee to proximal to ankle as well as left low back pain as patient fatigued.   Therapy/Group: Individual Therapy  Rayford Halsted 12/28/2022, 12:21 PM

## 2022-12-29 DIAGNOSIS — R5381 Other malaise: Secondary | ICD-10-CM | POA: Diagnosis not present

## 2022-12-29 LAB — GLUCOSE, CAPILLARY
Glucose-Capillary: 119 mg/dL — ABNORMAL HIGH (ref 70–99)
Glucose-Capillary: 138 mg/dL — ABNORMAL HIGH (ref 70–99)
Glucose-Capillary: 90 mg/dL (ref 70–99)
Glucose-Capillary: 133 mg/dL — ABNORMAL HIGH (ref 70–99)

## 2022-12-29 MED ORDER — FLUTICASONE PROPIONATE 50 MCG/ACT NA SUSP
2.0000 | Freq: Every day | NASAL | Status: DC
  Filled 2022-12-29 (×2): qty 16

## 2022-12-29 NOTE — Progress Notes (Addendum)
Speech Language Pathology Discharge Summary  Patient Details  Name: [name] MRN: [id number] Date of Birth: August 21, 1965  Date of Discharge from SLP service:December 29, 2022  Today's Date: 12/29/2022 SLP Individual Time: 0915-0943 SLP Individual Time Calculation (min): 28 min   Skilled Therapeutic Interventions:  Skilled treatment session focused on education with the patient and her husband.  Patient reports that she realizes that she was adamant that her cognitive deficits were due to baseline anxiety and ADD but now feels as though she may be experiencing true cognitive impairments. Patient appeared overwhelmed throughout conversation resulting in tearfulness, decreased word-finding and impaired thought organization. SLP provided support and reinforced strategies that she has been working on in SLP treatment sessions that focused on memory, attention, and organization. Also discussed f/u recommendations with the patient and her husband. Both wish to have HHST services for ongoing assessment of cognitive functioning when at home. SLP verbalized understanding and agreement. Patient left upright in wheelchair with husband present.   Patient has met 2 of 4long term goals.  Patient to discharge at overall Supervision;Modified Independent level.   Reasons goals not met: Patient continues to require Min cues for recall of functional information   Clinical Impression/Discharge Summary: Patient has made functional gains and has met 2 of 4 LTGs this admission. Currently, patient requires overall supervision-Mod I for selective attention and problem solving with functional, mildly complex tasks. Increased cueing is needed at times for recall of functional information. Suspect patient is near her cognitive baseline and that her ADD and anxiety are impacting her overall functional independence. However, recommend HHST for ongoing assessment of cognitive functioning after discharge. Patient and family  education is complete and patient will discharge home with supervision from family.   Care Partner:  Caregiver Able to Provide Assistance: Yes  Type of Caregiver Assistance: Physical;Cognitive  Recommendation:  HHST Intermittent Supervision     Equipment: N/A   Reasons for discharge: Treatment goals met;Discharged from hospital   Patient/Family Agrees with Progress Made and Goals Achieved: Yes    Tajh Livsey 12/29/2022, 6:07 AM

## 2022-12-29 NOTE — Progress Notes (Cosign Needed)
Pt is 57yr old female that has been on 4 MW since 12/17/2022.  Pt is AAO X 4. Pt does not have any signs of skin breakdown on head or face. All pulses equal bilaterally. Pt is on a life vest; S1 and S2 heard, heart sounds are equal. Pt pupils are round and reactive to light, pt wear glasses on a as needed basis. Pt doesn't wear dentures nor hearing aids. The pt appears to have some dry skin within both of her ears. All Breaths heard, no signs of wheezing or blockage of the lungs. Pt isn't on any O2 therapy she is on room air. Bowel sounds are active, pt states her last BM was early this morning (not hard or runny per pt) and that it is a forceful push. No nausea, diarrhea, or vomiting. Pt states her last menses was in Feb. 2024 and is premenopausal. No pain or burning when she urinates. Pt skin was dry and intact with some moderate bruising on both arms. Wound care was provided and wound is dry and intact with no discharge and is positive for necrotic tissue. Pt has 2+ pitting edema on LE bilaterally. Capillary refill was WDL as well her UE & LE strength was WDL.

## 2022-12-29 NOTE — Plan of Care (Signed)
  Problem: RH Balance Goal: LTG Patient will maintain dynamic standing with ADLs (OT) Description: LTG:  Patient will maintain dynamic standing balance with assist during activities of daily living (OT)  Outcome: Completed/Met   Problem: RH Bathing Goal: LTG Patient will bathe all body parts with assist levels (OT) Description: LTG: Patient will bathe all body parts with assist levels (OT) Outcome: Adequate for Discharge Note: Pt continues to require Min A for posterior peri-care bathing due to decreased functional reach. Handouts for adaptive equipment provided.    Problem: RH Dressing Goal: LTG Patient will perform upper body dressing (OT) Description: LTG Patient will perform upper body dressing with assist, with/without cues (OT). Outcome: Completed/Met Goal: LTG Patient will perform lower body dressing w/assist (OT) Description: LTG: Patient will perform lower body dressing with assist, with/without cues in positioning using equipment (OT) Outcome: Adequate for Discharge Note: Pt continues to require Min A for posterior peri-area due to decreased functional reach. Handouts for adaptive equipment provided.    Problem: RH Toileting Goal: LTG Patient will perform toileting task (3/3 steps) with assistance level (OT) Description: LTG: Patient will perform toileting task (3/3 steps) with assistance level (OT)  Outcome: Adequate for Discharge Note: Pt continues to require Min A for posterior peri-care bathing due to decreased functional reach. Handouts for adaptive equipment provided.    Problem: RH Toilet Transfers Goal: LTG Patient will perform toilet transfers w/assist (OT) Description: LTG: Patient will perform toilet transfers with assist, with/without cues using equipment (OT) Outcome: Completed/Met   Problem: RH Tub/Shower Transfers Goal: LTG Patient will perform tub/shower transfers w/assist (OT) Description: LTG: Patient will perform tub/shower transfers with assist,  with/without cues using equipment (OT) Outcome: Adequate for Discharge Note: Pt requires A for [name] management over tub threshold due to continued pain/discomfort at wound site.

## 2022-12-29 NOTE — Progress Notes (Signed)
PROGRESS NOTE   Subjective/Complaints:  Pt reports no nausea;  No changes to concerns- "same old, same old".   Not wearing CPAP for OSA- so psychiatry recommended adding flonase to help- ordered   ROS:   Pt denies SOB, abd pain, CP, N/V/C/D, and vision changes  +right sided weakness- improved + chills resolved + Constipation-worse + L knee pain +Anxiety   objective:   No results found. No results for input(s): "WBC", "HGB", "HCT", "PLT" in the last 72 hours.  No results for input(s): "NA", "K", "CL", "CO2", "GLUCOSE", "BUN", "CREATININE", "CALCIUM" in the last 72 hours.   Intake/Output Summary (Last 24 hours) at 12/29/2022 1255 Last data filed at 12/29/2022 1000 Gross per 24 hour  Intake 480 ml  Output --  Net 480 ml          Physical Exam: Vital Signs Blood pressure 99/71, pulse 88, temperature 98.3 F (36.8 C), temperature source Oral, resp. rate 16, height 5\' 4"  (1.626 m), weight (!) 152.4 kg, SpO2 96%.       General: woke up to speak with me; supine in bed; NAD HENT: conjugate gaze; oropharynx moist CV: regular rate; no JVD Pulmonary: CTA B/L; no W/R/R- good air movement GI: soft, NT, ND, (+)BS Psychiatric: sleepy, but a little anxiety still noted Neurological: more sleepy this Am, but woke up and spoke with me briefly  Neuro:  Alert and oriented x3, follows commands, cranial nerves II through XII gorssly intact, no speech or language deficits noted, + memory deficits Sensation intact light touch in all 4 extremities Poor safety awareness.   Strength 4+ out of 5 in left upper extremity Strength 4 out of 5 right upper extremity other then finger flexion 4-/5 Strength 4+ out of 5 right lower extremity Strength 2-3 out of 5 left lower extremity, exam stable 10/27 Musculoskeletal: No joint swelling noted.  Diffuse tenderness throughout right upper extremity improved she is lifting her arm to  gravity without difficulty, fluctuating weakness in finger flexion strength          Assessment/Plan: 1. Functional deficits which require 3+ hours per day of interdisciplinary therapy in a comprehensive inpatient rehab setting. Physiatrist is providing close team supervision and 24 hour management of active medical problems listed below. Physiatrist and rehab team continue to assess barriers to discharge/monitor patient progress toward functional and medical goals  Care Tool:  Bathing    Body parts bathed by patient: Right arm, Left arm, Chest, Abdomen, Front perineal area, Face   Body parts bathed by helper: Buttocks, Right lower leg, Right upper leg, Left upper leg Body parts n/a: Left lower leg   Bathing assist Assist Level: Moderate Assistance - Patient 50 - 74%     Upper Body Dressing/Undressing Upper body dressing   What is the patient wearing?: Hospital gown only    Upper body assist Assist Level: Minimal Assistance - Patient > 75%    Lower Body Dressing/Undressing Lower body dressing      What is the patient wearing?: Hospital gown only     Lower body assist Assist for lower body dressing: Minimal Assistance - Patient > 75%     Toileting Toileting  Toileting assist Assist for toileting: Moderate Assistance - Patient 50 - 74%     Transfers Chair/bed transfer  Transfers assist     Chair/bed transfer assist level: Independent with assistive device     Locomotion Ambulation   Ambulation assist      Assist level: Supervision/Verbal cueing Assistive device: Rollator Max distance: 150   Walk 10 feet activity   Assist     Assist level: Supervision/Verbal cueing Assistive device: Rollator   Walk 50 feet activity   Assist    Assist level: Supervision/Verbal cueing Assistive device: Rollator    Walk 150 feet activity   Assist Walk 150 feet activity did not occur: Safety/medical concerns  Assist level: Supervision/Verbal  cueing Assistive device: Rollator    Walk 10 feet on uneven surface  activity   Assist Walk 10 feet on uneven surfaces activity did not occur: Safety/medical concerns   Assist level: Supervision/Verbal cueing Assistive device: Rollator   Wheelchair     Assist Is the patient using a wheelchair?: Yes Type of Wheelchair: Manual    Wheelchair assist level: Dependent - Patient 0%      Wheelchair 50 feet with 2 turns activity    Assist        Assist Level: Dependent - Patient 0%   Wheelchair 150 feet activity     Assist      Assist Level: Dependent - Patient 0%   Blood pressure 99/71, pulse 88, temperature 98.3 F (36.8 C), temperature source Oral, resp. rate 16, height 5\' 4"  (1.626 m), weight (!) 152.4 kg, SpO2 96%.  Medical Problem List and Plan: 1. 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 Continue life vest              -patient may shower             -ELOS/Goals: 10-14 day,  PT/OT supervision to mod I, SLP mod I              -Con't Cir PT, OT and SLP- d/c tomorrow  -Discussed safety with pt, call nurses to get up.  -11/4 expected discharge    2.  Antithrombotics: -DVT/anticoagulation:  Pharmaceutical: Lovenox             -antiplatelet therapy: N/A 3. Pain Management:  Oxycodone prn for pain control.   -10/24 increase gabapentin to 200 mg 3 times daily, start lidocaine patch for chest wall  -10/25 increase gabapentin to 300 mg 3 times daily  -10/30 pain controlled, continue current regimen  -10/31 x-ray knee with degenerative changes noted.  Continue medications as above for pain.  11/2- Feels like pain comes and goes, fluctuates, but wants more pain meds- d/w pt that cannot increase at this time- can let primary team address on Monday.  4. Mood/Behavior/Sleep: Team to provide ego support. LCSW to follow for evaluation and support.              --Melatonin prn for insomnia.              -antipsychotic  agents: N/A at this time              --Neuropsych for coping skills.  5. Neuropsych/cognition: This patient is likely capable of making decisions on her own behalf. 6. Severe left lower extremity wound: wound examined with nursing and provided patient education: Added proteiin supplements and Vitamin C to promote wound healing             --BID dressing  changes with silvadene per plastics  -10/30 seen by plastic surgery yesterday, calcium alginate for wound care, Keflex started yesterday for redness possible skin infection in skin surrounding wound- discussed with pharmacy mild penicillin allergy,  she has tolerated cephalosporins    11/1 spoke with Dr. Ladona Ridgel on the phone with plastic surgery, Dr. Ladona Ridgel saw patient yesterday.  Planning to start Santyl for wound care with calcium alginate.  Could consider surgical debridement of the eschar, however concern regarding cardiac risk.  I spoke with EP nurse wound will need to be healed for ICD placement.  She recommended using defibrillator paddles during surgery while LifeVest is off.  Called back plastic surgery who has concerns seeding with surgery without cardiology present.  Provided phone number to contact cardiology back. 7. Fluids/Electrolytes/Nutrition: Strict I/O. Low salt diet. Juven and prostat added             -- Recheck CMET tomorrow 8. VT/VF arrest: Life Vest with plans for ICD once leg wound resolves?  9. NICM w/ HFrEF:  GDMT being resumed by Cards--on Aldactone, Lasix, Cozaar, Farxiga and Lipitor             --Daily wts and monitor for signs of overload. Strict I/O   -10/28 cardiology has signed off, follow-up appointment outpatient 11/26 and EPP 11/20  -10/29 will ask for standing weights as do not think current weights are accurate.  Her weight went from low 130s kilograms 10/23 and prior days to low 150 kg the following day  -10/30 new wt stable from prior, continue to monitor   -No signs of fluid overload at this time  -11/1  weights continue to fluctuate, suspect nursing using bed weight not standing weight as prior days  11/2- Stable compared to yesterday  11/3- no weight this Am- will d/w nursing Filed Weights   12/26/22 1300 12/27/22 0500 12/28/22 0500  Weight: (!) 136.4 kg (!) 152.4 kg (!) 152.4 kg    10. [name] wound with necrosis: Plastic consulted 10/22-->now on silvadene bid for local care --supplements added to promote wound healing.  11.  T2DM: Hgb A1c-  Was pm metformin/jardiance combo and Monjuaro PTA             --monitor BS ac/hs. AKI has resolved-->resume low dose metformin and titrate as indicated  -11/1 CBGs controlled, hold off on Mounjaro  CBG (last 3)  Recent Labs    12/28/22 2112 12/29/22 0616 12/29/22 1153  GLUCAP 132* 119* 138*   11/2- Bgs controlled- con't regimen  11/3- Bgs' controlled- con't regimen 12.  Acute renal failure vs CKD IIIa: Resolving   -BUN and creatinine stable at 22/1.07 13. H/o severe recurrent depression w/anxiety: Vraylar and Lexapro discontinued due to concerns of QT prolongation             --Is being followed by behavioral health/Dr. Elsie Saas. Had [facility] care visit 09/25/22 --continue Xanax prn   -11/1 psychiatry consulted,Vraylar and Lexapro were stopped due to concerns of QT prolongation previously discussed this with patient 14. OSA: Continue CPAP 15. H/o Lumbar spinal stenosis and  R-knee pain:  Resume gabapentin (mood and pain). She was going to outpatient therapy.   16. Morbid Obesity BMI-50.5: Multifactorial. Encourage/educate on life style and diet changes to help with wt loss and improve overall activity and health. .  17. ADD: Discussed need for activating medication (mood and energy per char review)--has ritalin on home meds but just approved  --Continue Ritalin added per secure chat with Dr. Bjorn Pippin.  11/3- Psychiatry still following- saw today went over their note- she's to f/u with behavioral health after d/c.  18. Vaginal discomfort: Likely due  to candidiasis --miconazole added for treatment.  19.  Hyperlipidemia: continue statin  20.  Right upper extremity pain/right shoulder pain  -Will check x-ray of her right shoulder as this seems to be the greatest location of pain at this time.  Suspect musculoskeletal pain due to increased activity.  21.  Bilateral jaw pain.   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  She has pain with movement and tenderness to palpation of her jaw.  Continue voltaren gel  22.  Chills reported yesterday.  Reports this has resolved.  She has been afebrile  -10/28 WBC within normal limits today  23. Hypotension  Losartan dose was decreased to 12.5 mg daily  -11/1 BP control  11/2- BP on low side 90s/50s- so fa,r wasn't dizzy, so will monitor.   11/3- BP running 99-107 systolic- no dizziness- con't to monitor    12/29/2022   12:42 PM 12/29/2022    3:40 AM 12/28/2022    7:31 PM  Vitals with BMI  Systolic 99 102 101  Diastolic 71 71 64  Pulse 88 88 87     24. Right arm weakness/pain: -cervical spin XR ordered and discussed that it shows multilevel spondylosis anf facet arthropathy. Attempted to order MRI cervical spine on Saturday but she is unable to have LifeVest off for more than 10 minutes, discussed steroids to reduced swelling in neck but she defers due to risks of worsening infection and weight gain, discussed increase in gabapentin or topamax and she chooses to try topamax, discussed that right arm symptoms improved temporarily with therapy, encouraged continued physical therapy to decompress spine, discussed that putting a lot of pressure while holding the walker could contribute.  -10/30-11/1improved, continue to monitor   11/2- asking for more pain meds,  25. Dry Mouth  -10/28 Discussed with pharmacy, no recent medications that very likely to cause this, will start biotene   11/2- still having dry mouth, but just woke up 26.  Constipation.    -Schedule miralax  -Will order sorbitol 60ml 10/29  -10/30 LBM yesterday  but amount not know, continue miralax, will check xray abdomen regarding stool burden  -10/31 moderate stool burden on x-ray yesterday.  She had additional dose of sorbitol yesterday afternoon.  MiraLAX increased to twice daily.  Will add Senokot at bedtime.  -11/1 continue additional medication if no BM today  11/2- having "tiny BM's"- will add Sorbitol as needed daily prn- so can use if need be for bowels.   11/3- large BM last night- took sorbitol 27. OSA-   11/3- Psychiatry suggested flonase nightly to improve CPAP compliance- will try QHS      LOS: 12 days A FACE TO FACE EVALUATION WAS PERFORMED  [name] 12/29/2022, 12:55 PM

## 2022-12-29 NOTE — Progress Notes (Signed)
Pt is 57yr old female that has been on 4 MW since 12/17/2022.  Pt is AAO X 4. Pt does not have any signs of skin breakdown on head or face. All pulses equal bilaterally. Pt is on a life vest; S1 and S2 heard, heart sounds are equal. Pt pupils are round and reactive to light, pt wear glasses on a as needed basis. Pt doesn't wear dentures nor hearing aids. The pt appears to have some dry skin within both of her ears. All Breath sounds heard, no signs of wheezing or blockage of the lungs. Pt is on room air. Bowel sounds are active, pt states her last BM was early this morning (not hard or runny per pt) and that it is a forceful push. No nausea, diarrhea, or vomiting. Pt states her last menses was in Feb. 2024 and is perimenopausal. No pain or burning when she urinates. Pt skin was dry and intact with some moderate bruising on both arms. Wound care was provided and wound is dry and intact with no discharge and is pink, brown and yellow in color. Pt has 2+ pitting edema on LE bilaterally. Capillary refill was WDL as well her UE & LE strength was WDL.  I agree with this student's documentation.

## 2022-12-29 NOTE — Progress Notes (Addendum)
Occupational Therapy Discharge Summary  Patient Details  Name: [name] MRN: [id number] Date of Birth: [birth year]  Date of Discharge from OT service:December 29, 2022  Today's Date: 12/29/2022 OT Individual Time: 1120-1200 OT Individual Time Calculation (min): 40 min    Patient has met 3 of 7 long term goals due to improved activity tolerance, improved balance, postural control, ability to compensate for deficits, and improved awareness.  Patient to discharge at overall Supervision-Min A. Patient's care partner is independent to provide the necessary physical assistance at discharge.    Reasons goals not met: Pt continues to require Min A for posterior peri-care during bathing/toileting due to decreased functional reach. Handouts for adaptive equipment provided. Pt requires A for [name] management over tub threshold due to continued pain/discomfort at wound site.   Recommendation:  Patient will benefit from ongoing skilled OT services in home health setting to continue to advance functional skills in the area of BADL, iADL, and Reduce care partner burden.  Equipment: TTB, bariatric BSC, reacher, and long-handled sponge  Reasons for discharge: treatment goals met and discharge from hospital  Patient/family agrees with progress made and goals achieved: Yes  OT Discharge Precautions/Restrictions  Precautions Precautions: Fall Precaution Comments: Life Vest, Falls, Chronic low back and R-knee pain; Onset of RUE weakness/pain since admission Restrictions Weight Bearing Restrictions: Yes RUE Weight Bearing: Weight bearing as tolerated Pain Pain Assessment Pain Scale: 0-10 Pain Score: 8  Pain Type: Acute pain Pain Location: Leg Pain Orientation: Left Pain Descriptors / Indicators: Pressure;Jabbing Pain Onset: With Activity Patients Stated Pain Goal: 7 Pain Intervention(s): Medication (See eMAR) Multiple Pain Sites: Yes 2nd Pain Site Pain Type: Acute pain Pain Location:  Chest Pain Orientation: Left Pain Descriptors / Indicators: Sore Pain Frequency: Intermittent Pain Onset: With Activity Pain Intervention(s): Medication (See eMAR);Rest ADL ADL Equipment Provided: Reacher, Long-handled sponge Eating: Independent Where Assessed-Eating: Wheelchair Grooming: Modified independent Where Assessed-Grooming: Sitting at sink Upper Body Bathing: Supervision/safety Where Assessed-Upper Body Bathing: Sitting at sink Lower Body Bathing: Minimal assistance Where Assessed-Lower Body Bathing: Sitting at sink, Standing at sink Upper Body Dressing: Supervision/safety (Supervision for adjustement of Life Vest) Where Assessed-Upper Body Dressing: Edge of bed, Wheelchair, Sitting at sink Lower Body Dressing: Other (Comment) (Pt only wears dresses.) Where Assessed-Lower Body Dressing: Sitting at sink, Edge of bed, Wheelchair Toileting: Minimal assistance Where Assessed-Toileting: Toilet, Bedside Commode Toilet Transfer: Distant supervision Toilet Transfer Method: Proofreader: Extra wide bedside commode (No AD for ambulation) Tub/Shower Transfer: Minimal assistance Tub/Shower Transfer Method: Ship broker: Insurance underwriter: Unable to assess Vision Baseline Vision/History: 1 Wears glasses Patient Visual Report: No change from baseline Vision Assessment?: No apparent visual deficits Perception  Perception: Within Functional Limits Praxis Praxis: WFL Cognition Cognition Overall Cognitive Status: Impaired/Different from baseline Arousal/Alertness: Awake/alert Memory: Impaired Awareness: Impaired Problem Solving: Impaired Safety/Judgment: Appears intact Brief Interview for Mental Status (BIMS) Repetition of Three Words (First Attempt): 3 Temporal Orientation: Year: Correct Temporal Orientation: Month: Missed by 6 days to 1 month Temporal Orientation: Day: No answer Recall: "Sock": No, could  not recall Recall: "Blue": Yes, no cue required Recall: "Bed": No, could not recall BIMS Summary Score: 9 Sensation Sensation Light Touch: Impaired Detail Peripheral sensation comments: Deficits L > R at feet, difficult to assess lower leg d/t wound wrap Light Touch Impaired Details: Impaired [name] Hot/Cold: Appears Intact Proprioception: Appears Intact Stereognosis: Appears Intact Coordination Gross Motor Movements are Fluid and Coordinated: No Fine Motor Movements are Fluid and Coordinated: Yes  Coordination and Movement Description: Decreased due to generalized weakness/debility & [name] wound. Motor  Motor Motor: Other (comment) Motor - Skilled Clinical Observations: Weakness d/t deconditioning and pain in multiple sites (back, ribs, chest, [name]). Mobility  Bed Mobility Bed Mobility: Supine to Sit;Sit to Supine Supine to Sit: Independent with assistive device Sit to Supine: Independent with assistive device Transfers Sit to Stand: Independent with assistive device Stand to Sit: Independent with assistive device  Trunk/Postural Assessment  Cervical Assessment Cervical Assessment: Exceptions to [facility] (forward head) Thoracic Assessment Thoracic Assessment: Exceptions to [facility] (rounded shoulders) Lumbar Assessment Lumbar Assessment: Exceptions to [facility] (posterior pelvic tilt) Postural Control Postural Control: Within Functional Limits  Balance Balance Balance Assessed: Yes Static Sitting Balance Static Sitting - Balance Support: Feet supported Static Sitting - Level of Assistance: 7: Independent Dynamic Sitting Balance Dynamic Sitting - Balance Support: Feet supported;During functional activity Dynamic Sitting - Level of Assistance: 7: Independent Dynamic Sitting - Balance Activities: Lateral lean/weight shifting;Forward lean/weight shifting;Reaching for objects Static Standing Balance Static Standing - Balance Support: Bilateral upper extremity supported;During functional  activity Static Standing - Level of Assistance: 6: Modified independent (Device/Increase time) Dynamic Standing Balance Dynamic Standing - Balance Support: During functional activity;Bilateral upper extremity supported Dynamic Standing - Level of Assistance: 6: Modified independent (Device/Increase time) Dynamic Standing - Balance Activities: Lateral lean/weight shifting;Forward lean/weight shifting;Reaching for objects Extremity/Trunk Assessment RUE Assessment RUE Assessment: Exceptions to [facility] Active Range of Motion (AROM) Comments: Limited by pain, ~90 degrees flexion/abduction General Strength Comments: 3+/5 LUE Assessment LUE Assessment: Exceptions to [facility] Active Range of Motion (AROM) Comments: Limited by pain, ~90 degrees flexion/abduction General Strength Comments: 3+/5  Skilled Intervention : Pt received sitting in recliner for skilled OT session with focus on discharge planning and HEP. Pt pain noted above, OT offering intermediate rest breaks and positioning suggestions throughout session to address pain/fatigue and maximize participation/safety in session.   DME function reviewed, pt re-educated on ability to ambulate into/out of bathroom without AD, as cleared by PT, to access small bathroom environment. Recommendation for sponge-bathing until [facility] provided as patient feels she is unable to manage Life Vest and wound water-proofing, stating "it's too much for me to do." HEP reviewed.  Pt remained sitting in recliner with all immediate needs met at end of session. Pt continues to be appropriate for skilled OT intervention to promote further functional independence.   Lou Cal, OTR/L, MSOT  12/29/2022, 2:48 PM

## 2022-12-29 NOTE — Plan of Care (Signed)
  Problem: RH Balance Goal: LTG Patient will maintain dynamic sitting balance (PT) Description: LTG:  Patient will maintain dynamic sitting balance with assistance during mobility activities (PT) Outcome: Completed/Met Goal: LTG Patient will maintain dynamic standing balance (PT) Description: LTG:  Patient will maintain dynamic standing balance with assistance during mobility activities (PT) Outcome: Completed/Met   Problem: Sit to Stand Goal: LTG:  Patient will perform sit to stand with assistance level (PT) Description: LTG:  Patient will perform sit to stand with assistance level (PT) Outcome: Completed/Met   Problem: RH Bed Mobility Goal: LTG Patient will perform bed mobility with assist (PT) Description: LTG: Patient will perform bed mobility with assistance, with/without cues (PT). Outcome: Completed/Met   Problem: RH Bed to Chair Transfers Goal: LTG Patient will perform bed/chair transfers w/assist (PT) Description: LTG: Patient will perform bed to chair transfers with assistance (PT). Outcome: Completed/Met   Problem: RH Car Transfers Goal: LTG Patient will perform car transfers with assist (PT) Description: LTG: Patient will perform car transfers with assistance (PT). Outcome: Completed/Met   Problem: RH Ambulation Goal: LTG Patient will ambulate in controlled environment (PT) Description: LTG: Patient will ambulate in a controlled environment, # of feet with assistance (PT). Outcome: Completed/Met Goal: LTG Patient will ambulate in home environment (PT) Description: LTG: Patient will ambulate in home environment, # of feet with assistance (PT). Outcome: Completed/Met   Problem: RH Stairs Goal: LTG Patient will ambulate up and down stairs w/assist (PT) Description: LTG: Patient will ambulate up and down # of stairs with assistance (PT) Outcome: Completed/Met

## 2022-12-29 NOTE — Progress Notes (Signed)
Physical Therapy Discharge Summary  Patient Details  Name: [name] MRN: [id number] Date of Birth: 17-Mar-1965  Date of Discharge from PT service:December 29, 2022  Today's Date: 12/29/2022 PT Individual Time: 1300-1357 PT Individual Time Calculation (min): 57 min    Patient has met 9 of 9 long term goals due to improved activity tolerance, improved balance, increased strength, decreased pain, ability to compensate for deficits, improved attention, improved awareness, and improved coordination.  Patient to discharge at an ambulatory level Supervision.   Patient's care partner is independent to provide the necessary physical assistance at discharge.   Recommendation:  Patient will benefit from ongoing skilled PT services in home health setting to continue to advance safe functional mobility, address ongoing impairments in Gait, balance, endurance, strength, ROM, and minimize fall risk.  Equipment: RW, Rollator   Reasons for discharge: treatment goals met and discharge from hospital  Patient/family agrees with progress made and goals achieved: Yes  PT Discharge Precautions/Restrictions Precautions Precautions: Fall Precaution Comments: Life Vest, Falls, Chronic low back and R-knee pain; Onset of RUE weakness/pain since admission Restrictions Weight Bearing Restrictions: Yes RUE Weight Bearing: Weight bearing as tolerated Pain Interference Pain Interference Pain Effect on Sleep: 1. Rarely or not at all Pain Interference with Therapy Activities: 1. Rarely or not at all Pain Interference with Day-to-Day Activities: 1. Rarely or not at all Vision/Perception  Vision - History Ability to See in Adequate Light: 0 Adequate Perception Perception: Within Functional Limits Praxis Praxis: WFL  Cognition Overall Cognitive Status: Impaired/Different from baseline Arousal/Alertness: Awake/alert Orientation Level: Oriented X4 Memory: Impaired Awareness: Impaired Problem Solving:  Impaired Safety/Judgment: Appears intact Sensation Sensation Light Touch: Impaired Detail Peripheral sensation comments: Deficits L > R at feet, difficult to assess lower leg d/t wound wrap Light Touch Impaired Details: Impaired [name] Hot/Cold: Appears Intact Proprioception: Appears Intact Stereognosis: Appears Intact Coordination Gross Motor Movements are Fluid and Coordinated: No Fine Motor Movements are Fluid and Coordinated: Yes Coordination and Movement Description: Decreased due to generalized weakness/debility & [name] wound. Motor  Motor Motor: Other (comment) Motor - Skilled Clinical Observations: weakness d/t deconditioning and pain in multiple sites (back, ribs, chest, [name])  Mobility Bed Mobility Bed Mobility: Supine to Sit;Sit to Supine Supine to Sit: Independent with assistive device Sit to Supine: Independent with assistive device Transfers Transfers: Sit to Stand;Stand to Sit;Stand Pivot Transfers Sit to Stand: Independent with assistive device Stand to Sit: Independent with assistive device Stand Pivot Transfers: Independent with assistive device Transfer (Assistive device): Rollator (and RW) Locomotion  Gait Ambulation: Yes Gait Assistance: Independent with assistive device Gait Distance (Feet): 150 Feet Assistive device: Rollator;Rolling walker Gait Gait: Yes Gait Pattern: Impaired Gait Pattern: Step-through pattern;Decreased step length - right;Decreased step length - left;Poor foot clearance - left;Poor foot clearance - right;Decreased stance time - left;Decreased stance time - right;Decreased stride length;Decreased hip/knee flexion - right;Decreased hip/knee flexion - left Gait velocity: decr Stairs / Additional Locomotion Stairs: Yes Stairs Assistance: Supervision/Verbal cueing Stair Management Technique: Two rails;Step to pattern Number of Stairs: 12 Height of Stairs: 6 Curb: Supervision/Verbal cueing Pick up small object from the floor assist level:  Supervision/Verbal cueing Wheelchair Mobility Wheelchair Mobility: Yes Wheelchair Assistance: Dependent - Patient 0% Wheelchair Parts Management: Needs assistance  Trunk/Postural Assessment  Cervical Assessment Cervical Assessment: Exceptions to [facility] (forward head) Thoracic Assessment Thoracic Assessment: Exceptions to [facility] (rounded shoulders) Lumbar Assessment Lumbar Assessment: Exceptions to [facility] (posterior pelvic tilt) Postural Control Postural Control: Within Functional Limits  Balance Balance Balance Assessed: Yes Static  Sitting Balance Static Sitting - Balance Support: Feet supported Static Sitting - Level of Assistance: 7: Independent Dynamic Sitting Balance Dynamic Sitting - Balance Support: Feet supported;During functional activity Dynamic Sitting - Level of Assistance: 7: Independent Static Standing Balance Static Standing - Balance Support: Bilateral upper extremity supported;During functional activity Static Standing - Level of Assistance: 6: Modified independent (Device/Increase time) Dynamic Standing Balance Dynamic Standing - Balance Support: During functional activity;Bilateral upper extremity supported Dynamic Standing - Level of Assistance: 6: Modified independent (Device/Increase time) Extremity Assessment  RLE Assessment RLE Assessment: Exceptions to [facility] Passive Range of Motion (PROM) Comments: WFL Active Range of Motion (AROM) Comments: WFL General Strength Comments: grossly 4/5 [name] Assessment [name] Assessment: Exceptions to [facility] Passive Range of Motion (PROM) Comments: WFL Active Range of Motion (AROM) Comments: limited knee flex/ext d/t pain General Strength Comments: grossly 3+/5, did not formally test d/t high pain, during ambulation pt demo no buckling   Today's Interventions   Pt seated in WC upon arrival with nursing in room. Pt agreeable to therapy. Pt reports L LE pain however did not interfere with participation in therapy, notified nursing.   Pt  requesting RW for home, as pt reports it is easier to navigate throughout her house in comparison to Rollator. Notified Child psychotherapist via Fish farm manager, as therapist recommending use of Rollator/RW, but pt previously refusing 2/2 concerns of R UE tension and rollator wouldn't fit in narrow spaces.   Pt ambulated 150 feet with RW and mod I.   Discussed home set up and pt CLOF with pt and husband, pt denies any questions or concerns.   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   1x10 standing hip flexion   1x10 sit to stand  1x10 LAQ  1x10 ankle pumps   Pt seated in [facility] with all needs within reach and husband in room at end of session.     [facility] [place], [place], DPT  12/29/2022, 12:51 PM

## 2022-12-29 NOTE — Plan of Care (Signed)
  Problem: RH Memory Goal: LTG Patient will demonstrate ability for day to day (SLP) Description: LTG:   Patient will demonstrate ability for day to day recall/carryover during cognitive/linguistic activities with assist  (SLP) Outcome: Not Met (add Reason) Note: Patient requires overall Min-Mod A for recall of functional information  Goal: LTG Patient will use memory compensatory aids to (SLP) Description: LTG:  Patient will use memory compensatory aids to recall biographical/new, daily complex information with cues (SLP) Outcome: Not Met (add Reason) Note: Patient requires overall Min-Mod A for to efficiently utilize memory compensatory strategies    Problem: RH Problem Solving Goal: LTG Patient will demonstrate problem solving for (SLP) Description: LTG:  Patient will demonstrate problem solving for basic/complex daily situations with cues  (SLP) Outcome: Completed/Met   Problem: RH Attention Goal: LTG Patient will demonstrate this level of attention during functional activites (SLP) Description: LTG:  Patient will will demonstrate this level of attention during functional activites (SLP) Outcome: Completed/Met

## 2022-12-30 ENCOUNTER — Ambulatory Visit: Payer: Medicare Other | Admitting: Internal Medicine

## 2022-12-30 ENCOUNTER — Other Ambulatory Visit (HOSPITAL_COMMUNITY): Payer: Self-pay

## 2022-12-30 DIAGNOSIS — R5381 Other malaise: Secondary | ICD-10-CM | POA: Diagnosis not present

## 2022-12-30 DIAGNOSIS — F4323 Adjustment disorder with mixed anxiety and depressed mood: Secondary | ICD-10-CM | POA: Diagnosis not present

## 2022-12-30 DIAGNOSIS — I5022 Chronic systolic (congestive) heart failure: Secondary | ICD-10-CM | POA: Diagnosis not present

## 2022-12-30 DIAGNOSIS — I959 Hypotension, unspecified: Secondary | ICD-10-CM | POA: Diagnosis not present

## 2022-12-30 LAB — BASIC METABOLIC PANEL
Anion gap: 11 (ref 5–15)
BUN: 28 mg/dL — ABNORMAL HIGH (ref 6–20)
CO2: 27 mmol/L (ref 22–32)
Calcium: 9 mg/dL (ref 8.9–10.3)
Chloride: 99 mmol/L (ref 98–111)
Creatinine, Ser: 0.93 mg/dL (ref 0.44–1.00)
GFR, Estimated: >60 mL/min (ref >60)
Glucose, Bld: 128 mg/dL — ABNORMAL HIGH (ref 70–99)
Potassium: 3.3 mmol/L — ABNORMAL LOW (ref 3.5–5.1)
Sodium: 137 mmol/L (ref 135–145)

## 2022-12-30 LAB — CBC
HCT: 34.8 % — ABNORMAL LOW (ref 36.0–46.0)
Hemoglobin: 11.1 g/dL — ABNORMAL LOW (ref 12.0–15.0)
MCH: 27.8 pg (ref 26.0–34.0)
MCHC: 31.9 g/dL (ref 30.0–36.0)
MCV: 87 fL (ref 80.0–100.0)
Platelets: 288 10*3/uL (ref 150–400)
RBC: 4 MIL/uL (ref 3.87–5.11)
RDW: 14.4 % (ref 11.5–15.5)
WBC: 8.9 10*3/uL (ref 4.0–10.5)
nRBC: 0 % (ref 0.0–0.2)

## 2022-12-30 LAB — GLUCOSE, CAPILLARY
Glucose-Capillary: 120 mg/dL — ABNORMAL HIGH (ref 70–99)
Glucose-Capillary: 130 mg/dL — ABNORMAL HIGH (ref 70–99)

## 2022-12-30 LAB — VITAMIN D 25 HYDROXY (VIT D DEFICIENCY, FRACTURES): Vit D, 25-Hydroxy: 20.33 ng/mL — ABNORMAL LOW (ref 30–100)

## 2022-12-30 MED ORDER — SANTYL 250 UNIT/GM EX OINT
1.0000 | TOPICAL_OINTMENT | Freq: Two times a day (BID) | CUTANEOUS | 0 refills | Status: DC
  Filled 2022-12-30: qty 30, 15d supply, fill #0

## 2022-12-30 MED ORDER — POTASSIUM CHLORIDE CRYS ER 20 MEQ PO TBCR
20.0000 meq | EXTENDED_RELEASE_TABLET | Freq: Every day | ORAL | 0 refills | Status: DC
  Filled 2022-12-30: qty 30, 30d supply, fill #0

## 2022-12-30 MED ORDER — CLOTRIMAZOLE 1 % VA CREA
TOPICAL_CREAM | Freq: Once | VAGINAL | 0 refills | Status: AC
  Filled 2022-12-30: qty 45, 7d supply, fill #0
  Filled 2022-12-30: qty 1, fill #0

## 2022-12-30 MED ORDER — NALOXONE HCL 4 MG/0.1ML NA LIQD
NASAL | 1 refills | Status: DC
  Filled 2022-12-30: qty 2, 14d supply, fill #0

## 2022-12-30 MED ORDER — COLLAGENASE 250 UNIT/GM EX OINT
TOPICAL_OINTMENT | Freq: Every day | CUTANEOUS | 0 refills | Status: DC
  Filled 2022-12-30: qty 30, 30d supply, fill #0

## 2022-12-30 MED ORDER — JUVEN PO PACK: 1.0000 | PACK | Freq: Two times a day (BID) | ORAL | 0 refills | Status: DC

## 2022-12-30 MED ORDER — CLOTRIMAZOLE 1 % VA CREA
1.0000 | TOPICAL_CREAM | Freq: Every day | VAGINAL | 0 refills | Status: DC
  Filled 2022-12-30: qty 45, 7d supply, fill #0

## 2022-12-30 MED ORDER — SANTYL 250 UNIT/GM EX OINT
1.0000 | TOPICAL_OINTMENT | Freq: Two times a day (BID) | CUTANEOUS | 0 refills | Status: DC
  Filled 2022-12-30: qty 30, 15d supply, fill #0
  Filled 2023-01-06 – 2023-01-24 (×2): qty 30, 15d supply, fill #1

## 2022-12-30 MED ORDER — POTASSIUM CHLORIDE CRYS ER 20 MEQ PO TBCR
40.0000 meq | EXTENDED_RELEASE_TABLET | Freq: Once | ORAL | Status: AC
  Administered 2022-12-30: 40 meq via ORAL
  Filled 2022-12-30: qty 2

## 2022-12-30 MED ORDER — FLUTICASONE PROPIONATE 50 MCG/ACT NA SUSP
2.0000 | Freq: Every day | NASAL | 0 refills | Status: DC
  Filled 2022-12-30: qty 16, 30d supply, fill #0

## 2022-12-30 NOTE — Discharge Summary (Signed)
Physician Discharge Summary  Patient ID: [name] MRN: [id number] DOB/AGE: 1965-07-15 57 y.o.  Admit date: 12/17/2022 Discharge date: 12/30/2022  Discharge Diagnoses:  Principal Problem:   Debility Active Problems:   Chronic combined systolic and diastolic heart failure (HCC)   OSA on CPAP   Non-seasonal allergic rhinitis due to pollen   Vaginal yeast infection   Hyperglycemia   Chest wall pain   Diabetes mellitus (HCC)   Chronic kidney disease   Adjustment disorder with mixed anxiety and depressed mood   Acute stress disorder   Discharged Condition: stable  Significant Diagnostic Studies: DG Abd 2 Views  Result Date: 12/25/2022 CLINICAL DATA:  Constipation EXAM: ABDOMEN - 2 VIEW COMPARISON:  None Available. FINDINGS: The bowel gas pattern is normal. There is moderate stool burden. There are calcifications in the pelvis favored as phleboliths. Surgical clips are seen in the pelvis and right upper quadrant. There is scoliosis and degenerative change of the lumbar spine. IMPRESSION: Nonobstructive bowel gas pattern. Moderate stool burden. Electronically Signed   By: Darliss Cheney M.D.   On: 12/25/2022 21:54   DG Knee Complete 4 Views Left  Result Date: 12/25/2022 CLINICAL DATA:  Pain.  Constipation and knee pain. EXAM: LEFT KNEE - COMPLETE 4+ VIEW COMPARISON:  None Available. FINDINGS: Degenerative changes in the left knee with medial greater than lateral compartment narrowing and small osteophyte formation. No evidence of acute fracture or dislocation. No focal bone lesion or bone destruction. Bone cortex appears intact. No significant effusion. Soft tissues are unremarkable. IMPRESSION: Mild degenerative changes in the left knee. No acute bony abnormalities. Electronically Signed   By: Burman Nieves M.D.   On: 12/25/2022 21:47   DG Cervical Spine Complete  Result Date: 12/21/2022 CLINICAL DATA:  Right hand weakness for 2 days, finger numbness EXAM: CERVICAL SPINE -  COMPLETE 4+ VIEW COMPARISON:  03/08/2011 FINDINGS: Frontal, bilateral oblique, lateral views of the cervical spine are obtained. Alignment is anatomic to the cervicothoracic junction on the lateral projection. There is moderate spondylosis at C4-5, C5-6, and C6-7. Mild diffuse facet hypertrophy. Evaluation of the inferior right neural foramina are limited by patient positioning. Otherwise the neural foramina appear patent. No evidence of acute fracture. Prevertebral soft tissues are unremarkable. IMPRESSION: 1. Multilevel cervical spondylosis and facet hypertrophy. 2. No acute bony abnormalities. Electronically Signed   By: Sharlet Salina M.D.   On: 12/21/2022 21:29   DG Shoulder Right  Result Date: 12/20/2022 CLINICAL DATA:  Pain EXAM: RIGHT SHOULDER - 2+ VIEW COMPARISON:  Right shoulder x-ray 07/26/2010 FINDINGS: There is no evidence of fracture or dislocation. There is no evidence of arthropathy or other focal bone abnormality. Soft tissues are unremarkable. IMPRESSION: Negative. Electronically Signed   By: Darliss Cheney M.D.   On: 12/20/2022 17:07   MR CARDIAC MORPHOLOGY W WO CONTRAST  Result Date: 12/12/2022 CLINICAL DATA:  20F with chronic CHF p/w cardiac arrest. Cath with normal coronary arteries. Echo with EF 30-35%, normal RV function EXAM: CARDIAC MRI TECHNIQUE: The patient was scanned on a 1.5 Tesla Siemens magnet. A dedicated cardiac coil was used. Functional imaging was done using Fiesta sequences. 2,3, and 4 chamber views were done to assess for RWMA's. Modified Simpson's rule using a short axis stack was used to calculate an ejection fraction on a dedicated work [employer], [employer]. The patient received 10 cc of Gadavist. After 10 minutes inversion recovery sequences were used to assess for infiltration and scar tissue. Phase contrast velocity mapping was performed above  the aortic and pulmonic valves CONTRAST:  10 cc  of Gadavist FINDINGS: Left ventricle: -No hypertrophy -Normal  size -Moderate systolic dysfunction -Elevated ECV (31%) -Normal T2 values -RV insertion site LGE LV EF:  31% (Normal 52-79%) Absolute volumes: LV EDV: (Normal 78-167 mL) LV ESV: (Normal 21-64 mL) LV SV: 72mL (Normal 52-114 mL) CO: 5.9L/min (Normal 2.7-6.3 L/min) Indexed volumes: LV EDV: 58mL/sq-m (Normal 50-96 mL/sq-m) LV ESV: 72mL/sq-m (Normal 10-40 mL/sq-m) LV SV: 69mL/sq-m (Normal 33-64 mL/sq-m) CI: 2.4L/min/sq-m (Normal 1.9-3.9 L/min/sq-m) Right ventricle: Normal size and systolic function RV EF: 50% (Normal 52-80%) Absolute volumes: RV EDV: (Normal 79-175 mL) RV ESV: 70mL (Normal 13-75 mL) RV SV: 69mL (Normal 56-110 mL) CO: 5.6L/min (Normal 2.7-6 L/min) Indexed volumes: RV EDV: 20mL/sq-m (Normal 51-97 mL/sq-m) RV ESV: 35mL/sq-m (Normal 9-42 mL/sq-m) RV SV: 36mL/sq-m (Normal 35-61 mL/sq-m) CI: 2.2L/min/sq-m (Normal 1.8-3.8 L/min/sq-m) Left atrium: Mild enlargement Right atrium: Normal size Mitral valve: Trivial regurgitation Aortic valve: Trivial regurgitation Tricuspid valve: Trivial regurgitation Pulmonic valve: Trivial regurgitation Aorta: Normal proximal ascending aorta Pericardium: Normal IMPRESSION: 1. Normal LV size, no hypertrophy, and moderate systolic dysfunction (EF 31%) 2.  Normal RV size and systolic function (EF 50%) 3. RV insertion site late gadolinium enhancement, which is a nonspecific scar pattern often seen in setting of elevated pulmonary pressures Electronically Signed   By: Epifanio Lesches M.D.   On: 12/12/2022 22:29   MR CARDIAC VELOCITY FLOW MAP  Result Date: 12/12/2022 CLINICAL DATA:  63F with chronic CHF p/w cardiac arrest. Cath with normal coronary arteries. Echo with EF 30-35%, normal RV function EXAM: CARDIAC MRI TECHNIQUE: The patient was scanned on a 1.5 Tesla Siemens magnet. A dedicated cardiac coil was used. Functional imaging was done using Fiesta sequences. 2,3, and 4 chamber views were done to assess for RWMA's. Modified Simpson's rule using a  short axis stack was used to calculate an ejection fraction on a dedicated work [employer] officer, [employer]. The patient received 10 cc of Gadavist. After 10 minutes inversion recovery sequences were used to assess for infiltration and scar tissue. Phase contrast velocity mapping was performed above the aortic and pulmonic valves CONTRAST:  10 cc  of Gadavist FINDINGS: Left ventricle: -No hypertrophy -Normal size -Moderate systolic dysfunction -Elevated ECV (31%) -Normal T2 values -RV insertion site LGE LV EF:  31% (Normal 52-79%) Absolute volumes: LV EDV: (Normal 78-167 mL) LV ESV: (Normal 21-64 mL) LV SV: 72mL (Normal 52-114 mL) CO: 5.9L/min (Normal 2.7-6.3 L/min) Indexed volumes: LV EDV: 91mL/sq-m (Normal 50-96 mL/sq-m) LV ESV: 75mL/sq-m (Normal 10-40 mL/sq-m) LV SV: 3mL/sq-m (Normal 33-64 mL/sq-m) CI: 2.4L/min/sq-m (Normal 1.9-3.9 L/min/sq-m) Right ventricle: Normal size and systolic function RV EF: 50% (Normal 52-80%) Absolute volumes: RV EDV: (Normal 79-175 mL) RV ESV: 70mL (Normal 13-75 mL) RV SV: 69mL (Normal 56-110 mL) CO: 5.6L/min (Normal 2.7-6 L/min) Indexed volumes: RV EDV: 38mL/sq-m (Normal 51-97 mL/sq-m) RV ESV: 51mL/sq-m (Normal 9-42 mL/sq-m) RV SV: 50mL/sq-m (Normal 35-61 mL/sq-m) CI: 2.2L/min/sq-m (Normal 1.8-3.8 L/min/sq-m) Left atrium: Mild enlargement Right atrium: Normal size Mitral valve: Trivial regurgitation Aortic valve: Trivial regurgitation Tricuspid valve: Trivial regurgitation Pulmonic valve: Trivial regurgitation Aorta: Normal proximal ascending aorta Pericardium: Normal IMPRESSION: 1. Normal LV size, no hypertrophy, and moderate systolic dysfunction (EF 31%) 2.  Normal RV size and systolic function (EF 50%) 3. RV insertion site late gadolinium enhancement, which is a nonspecific scar pattern often seen in setting of elevated pulmonary pressures Electronically Signed   By: Emilie Rutter.D.  On: 12/12/2022 22:29   MR CARDIAC VELOCITY FLOW MAP  Result  Date: 12/12/2022 CLINICAL DATA:  40F with chronic CHF p/w cardiac arrest. Cath with normal coronary arteries. Echo with EF 30-35%, normal RV function EXAM: CARDIAC MRI TECHNIQUE: The patient was scanned on a 1.5 Tesla Siemens magnet. A dedicated cardiac coil was used. Functional imaging was done using Fiesta sequences. 2,3, and 4 chamber views were done to assess for RWMA's. Modified Simpson's rule using a short axis stack was used to calculate an ejection fraction on a dedicated work [employer] officer, [employer]. The patient received 10 cc of Gadavist. After 10 minutes inversion recovery sequences were used to assess for infiltration and scar tissue. Phase contrast velocity mapping was performed above the aortic and pulmonic valves CONTRAST:  10 cc  of Gadavist FINDINGS: Left ventricle: -No hypertrophy -Normal size -Moderate systolic dysfunction -Elevated ECV (31%) -Normal T2 values -RV insertion site LGE LV EF:  31% (Normal 52-79%) Absolute volumes: LV EDV: (Normal 78-167 mL) LV ESV: (Normal 21-64 mL) LV SV: 72mL (Normal 52-114 mL) CO: 5.9L/min (Normal 2.7-6.3 L/min) Indexed volumes: LV EDV: 23mL/sq-m (Normal 50-96 mL/sq-m) LV ESV: 55mL/sq-m (Normal 10-40 mL/sq-m) LV SV: 68mL/sq-m (Normal 33-64 mL/sq-m) CI: 2.4L/min/sq-m (Normal 1.9-3.9 L/min/sq-m) Right ventricle: Normal size and systolic function RV EF: 50% (Normal 52-80%) Absolute volumes: RV EDV: (Normal 79-175 mL) RV ESV: 70mL (Normal 13-75 mL) RV SV: 69mL (Normal 56-110 mL) CO: 5.6L/min (Normal 2.7-6 L/min) Indexed volumes: RV EDV: 67mL/sq-m (Normal 51-97 mL/sq-m) RV ESV: 59mL/sq-m (Normal 9-42 mL/sq-m) RV SV: 19mL/sq-m (Normal 35-61 mL/sq-m) CI: 2.2L/min/sq-m (Normal 1.8-3.8 L/min/sq-m) Left atrium: Mild enlargement Right atrium: Normal size Mitral valve: Trivial regurgitation Aortic valve: Trivial regurgitation Tricuspid valve: Trivial regurgitation Pulmonic valve: Trivial regurgitation Aorta: Normal proximal ascending aorta  Pericardium: Normal IMPRESSION: 1. Normal LV size, no hypertrophy, and moderate systolic dysfunction (EF 31%) 2.  Normal RV size and systolic function (EF 50%) 3. RV insertion site late gadolinium enhancement, which is a nonspecific scar pattern often seen in setting of elevated pulmonary pressures Electronically Signed   By: Epifanio Lesches M.D.   On: 12/12/2022 22:29   DG CHEST PORT 1 VIEW  Result Date: 12/10/2022 CLINICAL DATA:  Pneumonia EXAM: PORTABLE CHEST 1 VIEW COMPARISON:  12/06/2022 FINDINGS: Patient is rotated. Endotracheal and enteric tubes have been removed. Left subclavian central line remains in place. Cardiac silhouette remains mildly enlarged. Improving aeration of the lung bases with mild residual atelectasis. No pleural effusion or pneumothorax. IMPRESSION: Improving aeration of the lung bases with mild residual atelectasis. Electronically Signed   By: Duanne Guess D.O.   On: 12/10/2022 10:28   CARDIAC CATHETERIZATION  Result Date: 12/09/2022 Images from the original result were not included. Right & Left Heart Catheterization 12/09/22: Hemodynamic data: LV: 119/7, EDP 17 mmHg.  Ao 110/78, mean 92 mmHg.  No pressure gradient across the aortic valve. RA: 15/13, mean 8 mmHg. RV 47/6, EDP 17 mmHg. PA 46/21, mean 31 mmHg.  PA saturation 66%. PW 18/19, mean 19 mmHg.  AO saturation 92%. CO 6.69, CI 2.83, preserved and normal.  QP/QS 1.00. Angiographic data: RCA: Dominant, has anterior origin, small to normal. LM: Has superior takeoff, a 5 [place] 3.0 guide catheter was utilized to engage.  Normal left main. LAD: Large-caliber vessel giving origin to 2 large diagonals, smooth and normal. RI: Moderate caliber vessel, smooth and normal. LCx: Large vessel giving origin to large OM1 which has secondary branches and continues in the AV groove  is a moderate-sized vessel and gives origin to OM 2 from the distal end.  Smooth and normal. Impression and recommendations: Findings consistent  with nonischemic cardiomyopathy.  Mildly elevated PA pressure with elevated EDP.   ECHOCARDIOGRAM COMPLETE  Result Date: 12/07/2022    ECHOCARDIOGRAM REPORT   Patient Name:   JAYMIE MCKIDDY Date of Exam: 12/07/2022 Medical Rec #:  [id number]    Height:       64.0 in Accession #:    [id number]   Weight:       302.9 lb Date of Birth:  December 08, 1965     BSA:          2.334 m Patient Age:    57 years     BP:           133/67 mmHg Patient Gender: F            HR:           78 bpm. Exam Location:  Inpatient Procedure: 2D Echo, Color Doppler, Cardiac Doppler and Intracardiac            Opacification Agent Indications:    Shock  History:        Patient has no prior history of Echocardiogram examinations and                 Patient has prior history of Echocardiogram examinations, most                 recent 07/10/2021. Risk Factors:Diabetes, Dyslipidemia,                 Obstructive Sleep Apnea and Former Smoker.  Sonographer:    Raeford Razor Referring Phys: [phone] [facility]aren[facility]chley [facility]  Sonographer Comments: Suboptimal parasternal window, patient is obese and suboptimal subcostal window. IMPRESSIONS  1. Left ventricular ejection fraction, by estimation, is 30 to 35%. The left ventricle has moderately decreased function. The left ventricle demonstrates global hypokinesis. There is mild left ventricular hypertrophy. Indeterminate diastolic filling due  to E-A fusion.  2. Right ventricular systolic function is normal. The right ventricular size is normal.  3. The mitral valve is normal in structure. Trivial mitral valve regurgitation.  4. The aortic valve is tricuspid. Aortic valve regurgitation is not visualized.  5. The inferior vena cava IVC not well visualized. Comparison(s): No significant change from prior study. FINDINGS  Left Ventricle: Left ventricular ejection fraction, by estimation, is 30 to 35%. The left ventricle has moderately decreased function. The left ventricle demonstrates global hypokinesis. Definity contrast agent was  given IV to delineate the left ventricular endocardial borders. The left ventricular internal cavity size was normal in size. There is mild left ventricular hypertrophy. Indeterminate diastolic filling due to E-A fusion. Right Ventricle: The right ventricular size is normal. Right ventricular systolic function is normal. Left Atrium: Left atrial size was normal in size. Right Atrium: Right atrial size was normal in size. Pericardium: There is no evidence of pericardial effusion. Mitral Valve: The mitral valve is normal in structure. Trivial mitral valve regurgitation. Tricuspid Valve: Tricuspid valve regurgitation is not demonstrated. Aortic Valve: The aortic valve is tricuspid. Aortic valve regurgitation is not visualized. Aortic valve mean gradient measures 4.0 mmHg. Aortic valve peak gradient measures 7.0 mmHg. Pulmonic Valve: Pulmonic valve regurgitation is not visualized. Aorta: The aortic root and ascending aorta are structurally normal, with no evidence of dilitation. Venous: The inferior vena cava IVC not well visualized. IAS/Shunts: The interatrial septum was not well visualized.  LEFT VENTRICLE  PLAX 2D LVIDd:         4.70 cm      Diastology LVIDs:         3.90 cm      LV e' medial:    2.94 cm/s LV PW:         1.10 cm      LV E/e' medial:  14.1 LV IVS:        1.00 cm      LV e' lateral:   2.93 cm/s LVOT diam:     2.00 cm      LV E/e' lateral: 14.2 LVOT Area:     3.14 cm  LV Volumes (MOD) LV vol d, MOD A2C: 184.0 ml LV vol d, MOD A4C: 187.0 ml LV vol s, MOD A2C: 128.0 ml LV vol s, MOD A4C: 130.0 ml LV SV MOD A2C:     56.0 ml LV SV MOD A4C:     187.0 ml LV SV MOD BP:      58.1 ml RIGHT VENTRICLE RV Basal diam:  3.80 cm RV Mid diam:    3.30 cm RV S prime:     9.60 cm/s TAPSE (M-mode): 1.5 cm LEFT ATRIUM             Index        RIGHT ATRIUM           Index LA diam:        4.00 cm 1.71 cm/m   RA Area:     14.40 cm LA Vol (A2C):   19.2 ml 8.23 ml/m   RA Volume:   34.40 ml  14.74 ml/m LA Vol (A4C):   31.0 ml  13.28 ml/m LA Biplane Vol: 26.1 ml 11.18 ml/m  AORTIC VALVE AV Area (Vmax): 1.53 cm AV Vmax:        132.00 cm/s AV Vmean:       87.400 cm/s AV VTI:         0.230 m AV Peak Grad:   7.0 mmHg AV Mean Grad:   4.0 mmHg LVOT Vmax:      64.30 cm/s  AORTA Ao Root diam: 2.70 cm Ao Asc diam:  3.00 cm MITRAL VALVE MV Area (PHT): 3.37 cm    SHUNTS MV Decel Time: 225 msec    Systemic Diam: 2.00 cm MV E velocity: 41.60 cm/s MV A velocity: 59.50 cm/s MV E/A ratio:  0.70 Photographer signed by Carolan Clines Signature Date/Time: 12/07/2022/2:19:27 PM    Final    DG CHEST PORT 1 VIEW  Result Date: 12/06/2022 CLINICAL DATA:  Central line placement EXAM: PORTABLE CHEST 1 VIEW COMPARISON:  12/05/2022 radiographs FINDINGS: The patient is rotated to the left on today's radiograph, reducing diagnostic sensitivity and specificity. Endotracheal tube tip 3.2 cm above the carina. A left central line is observed with tip projecting over the right atrium. Low lung volumes are present, causing crowding of the pulmonary vasculature. No pneumothorax observed. Suspected underlying mild enlargement of the cardiopericardial silhouette IMPRESSION: 1. Left central line tip projects over the right atrium. 2. Endotracheal tube tip 3.2 cm above the carina. 3. Low lung volumes. 4. Suspected underlying mild enlargement of the cardiopericardial silhouette. Electronically Signed   By: Gaylyn Rong M.D.   On: 12/06/2022 17:32   CT Angio Chest Pulmonary Embolism (PE) W or WO Contrast  Result Date: 12/05/2022 CLINICAL DATA:  Chest pain. The patient is intubated. Pulmonary embolism suspected. EXAM: CT ANGIOGRAPHY CHEST WITH CONTRAST TECHNIQUE: Multidetector CT  imaging of the chest was performed using the standard protocol during bolus administration of intravenous contrast. Multiplanar CT image reconstructions and MIPs were obtained to evaluate the vascular anatomy. RADIATION DOSE REDUCTION: This exam was performed according to the  departmental dose-optimization program which includes automated exposure control, adjustment of the mA and/or kV according to patient size and/or use of iterative reconstruction technique. CONTRAST:  75mL OMNIPAQUE IOHEXOL 350 MG/ML SOLN COMPARISON:  Chest radiographs earlier today and CT 10/19/2016 FINDINGS: Cardiovascular: Suboptimal evaluation of the segmental and subsegmental pulmonary arteries due to motion. No evidence of pulmonary embolism. No pericardial effusion. Cardiomegaly. Mediastinum/Nodes: Endotracheal tube tip in the intrathoracic trachea 1.9 cm from the carina. Subdiaphragmatic enteric tube. No thoracic adenopathy. Lungs/Pleura: Bilateral posterior upper lobe atelectasis. Atelectasis in the superior segment of the lower lobes. No pleural effusion or pneumothorax. Upper Abdomen: Cholecystectomy.  No acute abnormality. Musculoskeletal: No acute fracture. Review of the MIP images confirms the above findings. IMPRESSION: 1. No evidence of pulmonary embolism. 2. Bilateral upper lobe atelectasis. Pneumonia is considered less likely though difficult to exclude. Electronically Signed   By: Minerva Fester M.D.   On: 12/05/2022 21:39   CT Head Wo Contrast  Result Date: 12/05/2022 CLINICAL DATA:  Mental status change, unknown cause EXAM: CT HEAD WITHOUT CONTRAST TECHNIQUE: Contiguous axial images were obtained from the base of the skull through the vertex without intravenous contrast. RADIATION DOSE REDUCTION: This exam was performed according to the departmental dose-optimization program which includes automated exposure control, adjustment of the mA and/or kV according to patient size and/or use of iterative reconstruction technique. COMPARISON:  None Available. FINDINGS: Brain: Mild streak artifact from adjacent monitoring device. Questionable area of low-density in the right frontal lobe. Gray-white differentiation, series 3, image 28. This is not confirmed on reformats. No acute intracranial  hemorrhage. No hydrocephalus. No subdural or extra-axial collection. Vascular: No evidence of hyperdense vessel, although artifact limits assessment. Skull: No fracture or focal lesion.  Frontal hyperostosis. Sinuses/Orbits: No acute finding.  Orogastric tube and enteric tube. Other: None. IMPRESSION: 1. Questionable area of low-density in the right frontal lobe. Gray-white differentiation is preserved. This is not confirmed on reformats. This may be artifactual, recommend MRI for further assessment. 2. No acute intracranial hemorrhage. Electronically Signed   By: Narda Rutherford M.D.   On: 12/05/2022 21:35   DG Abd Portable 1V  Result Date: 12/05/2022 CLINICAL DATA:  Orogastric tube placement. EXAM: PORTABLE ABDOMEN - 1 VIEW COMPARISON:  None Available. FINDINGS: Portable supine view of the abdomen focused on the left upper quadrant. Tip and side port of the enteric tube below the diaphragm in the stomach. Paucity of bowel gas in the included abdomen. IMPRESSION: Tip and side port of the enteric tube below the diaphragm in the stomach. Electronically Signed   By: Narda Rutherford M.D.   On: 12/05/2022 20:36   DG Chest Port 1 View  Result Date: 12/05/2022 CLINICAL DATA:  Chest pain.  Patient is intubated EXAM: PORTABLE CHEST 1 VIEW COMPARISON:  02/25/2022 FINDINGS: Endotracheal tube tip just below the clavicular heads, the carina is difficult to accurately delineate on the current exam. Enteric tube with tip below the diaphragm not included in the field of view. Very low lung volumes limit assessment. The heart is enlarged. Slight globular appearance of the cardiac silhouette is nonspecific due to technique and low lung volumes. No pneumothorax or large pleural effusion. Bronchovascular crowding. IMPRESSION: 1. Endotracheal tube tip just below the clavicular heads, the carina is difficult to  accurately delineate on the current exam. Enteric tube with tip below the diaphragm not included in the field of  view. 2. Very low lung volumes with bronchovascular crowding. Cardiomegaly. Electronically Signed   By: Narda Rutherford M.D.   On: 12/05/2022 20:35    Labs:  Basic Metabolic Panel: Recent Labs  Lab 12/26/22 0649 12/30/22 0608  NA 136 137  K 3.5 3.3*  CL 97* 99  CO2 29 27  GLUCOSE 117* 128*  BUN 22* 28*  CREATININE 1.07* 0.93  CALCIUM 8.9 9.0    CBC: Recent Labs  Lab 12/26/22 0649 12/30/22 0608  WBC 7.3 8.9  HGB 11.7* 11.1*  HCT 36.1 34.8*  MCV 87.2 87.0  PLT 303 288    CBG: Recent Labs  Lab 12/29/22 1153 12/29/22 1724 12/29/22 2035 12/30/22 0613 12/30/22 1216  GLUCAP 138* 90 133* 120* 130*    Brief HPI:   Okema Rollinson is a 57 y.o. female ***   Hospital Course: Cleopatra Sardo was admitted to rehab 12/17/2022 for inpatient therapies to consist of PT, ST and OT at least three hours five days a week. Past admission physiatrist, therapy team and rehab RN have worked together to provide customized collaborative inpatient rehab.   Blood pressures were monitored on TID basis and   Diabetes has been monitored with ac/hs CBG checks and SSI was use prn for tighter BS control.    Rehab course: During patient's stay in rehab weekly team conferences were held to monitor patient's progress, set goals and discuss barriers to discharge. At admission, patient required  He/She  has had improvement in activity tolerance, balance, postural control as well as ability to compensate for deficits. He/She has had improvement in functional use RUE/LUE  and RLE/[name] as well as improvement in awareness       Disposition:  Discharge disposition: 06-Home-Health Care Svc        Diet:  Special Instructions:  Discharge Instructions     Ambulatory referral to Psychiatry   Complete by: As directed    Hospital follow up discuss/adjust medications   Ambulatory referral to Psychology   Complete by: As directed       Allergies as of 12/30/2022       Reactions   Fluoxetine  Other (See Comments)   More depressed   Cymbalta [duloxetine Hcl] Other (See Comments)   depressed   Pork-derived Products    Penicillins Rash   Has patient had a PCN reaction causing immediate rash, facial/tongue/throat swelling, SOB or lightheadedness with hypotension: YES Has patient had a PCN reaction causing severe rash involving mucus membranes or skin necrosis: NO Has patient had a PCN reaction that required hospitalization: YES Has patient had a PCN reaction occurring within the last 10 years: NO If all of the above answers are "NO", then may proceed with Cephalosporin use.        Medication List     STOP taking these medications    cariprazine 3 MG capsule Commonly known as: Vraylar   escitalopram 10 MG tablet Commonly known as: LEXAPRO   Foam Cushion Therapeutic Ring Misc       TAKE these medications    acetaminophen 325 MG tablet Commonly known as: TYLENOL Take 1-2 tablets (325-650 mg total) by mouth every 4 (four) hours as needed for mild pain (pain score 1-3).   ALPRAZolam 0.5 MG tablet Commonly known as: XANAX Take 1 tablet (0.5 mg total) by mouth 2 (two) times daily as needed for anxiety (if anxiety  unrelieved by hydroxyzine).   ascorbic acid 500 MG tablet Commonly known as: VITAMIN C Take 1 tablet (500 mg total) by mouth daily.   atorvastatin 80 MG tablet Commonly known as: LIPITOR Take 1 tablet (80 mg total) by mouth daily.   Blood Glucose Monitoring Suppl Devi 1 each by Does not apply route in the morning, at noon, and at bedtime. May substitute to any manufacturer covered by patient's insurance.   BLOOD GLUCOSE TEST STRIPS Strp 1 each by In Vitro route in the morning, at noon, and at bedtime. May substitute to any manufacturer covered by patient's insurance.   cephALEXin 500 MG capsule Commonly known as: KEFLEX Take 1 capsule (500 mg total) by mouth every 8 (eight) hours.   CertaVite/Antioxidants Tabs Take 1 tablet by mouth daily.    clotrimazole 1 % vaginal cream Commonly known as: Clotrimazole-7 Place vaginally once for 1 dose.   diclofenac Sodium 1 % Gel Commonly known as: VOLTAREN Apply 2 g topically 4 (four) times daily. To jaw for TMJ symptoms   Farxiga 10 MG Tabs tablet Generic drug: dapagliflozin propanediol Take 1 tablet (10 mg total) by mouth daily.   fluticasone 50 MCG/ACT nasal spray Commonly known as: FLONASE Place 2 sprays into both nostrils at bedtime.   furosemide 40 MG tablet Commonly known as: LASIX Take 1 tablet (40 mg total) by mouth daily.   gabapentin 300 MG capsule Commonly known as: NEURONTIN Take 1 capsule (300 mg total) by mouth 3 (three) times daily. What changed:  medication strength how much to take   lidocaine 5 % Commonly known as: LIDODERM Place 2 patches onto the skin daily. Remove & Discard patch within 12 hours or as directed by MD   losartan 25 MG tablet Commonly known as: COZAAR Take 0.5 tablets (12.5 mg total) by mouth daily. What changed:  medication strength how much to take   magnesium oxide 400 MG tablet Commonly known as: MAG-OX Take 1 tablet (400 mg total) by mouth at bedtime.   metFORMIN 500 MG tablet Commonly known as: GLUCOPHAGE Take 0.5 tablets (250 mg total) by mouth 2 (two) times daily with a meal.   methylphenidate 5 MG tablet Commonly known as: RITALIN Take 1 tablet (5 mg total) by mouth 2 (two) times daily with breakfast and lunch.   metoprolol succinate 50 MG 24 hr tablet Commonly known as: TOPROL-XL Take 1 tablet (50 mg total) by mouth daily. Take with or immediately following a meal.   Mounjaro 2.5 MG/0.5ML Pen Generic drug: tirzepatide INJECT 2.5 MG SUBCUTANEOUSLY WEEKLY   naloxone 4 MG/0.1ML Liqd nasal spray kit Commonly known as: NARCAN Use in case of overdose   NON FORMULARY Pt uses cpap nightly   nutrition supplement (JUVEN) Pack Take 1 packet by mouth 2 (two) times daily between meals.   Oxycodone HCl 10 MG  Tabs Take 0.5-1 tablets (5-10 mg total) by mouth every 6 (six) hours as needed for severe pain (pain score 7-10). What changed:  medication strength how much to take when to take this reasons to take this Notes to patient: Limit to 2 pills per day as needed. Can cut pills in 1/2 to wean off as pain improves   polyethylene glycol powder 17 GM/SCOOP powder Commonly known as: GLYCOLAX/MIRALAX Take 1 capful (17 g) in water 2 (two) times daily.   potassium chloride SA 20 MEQ tablet Commonly known as: KLOR-CON M Take 1 tablet (20 mEq total) by mouth daily.   Santyl 250 UNIT/GM ointment Generic  drug: collagenase Apply 1 gram topically 2 (two) times daily to area on knee, cover with calcium alginate, 4 X 4 and kerlix   Senna-S 8.6-50 MG tablet Generic drug: senna-docusate Take 2 tablets by mouth at bedtime.   spironolactone 25 MG tablet Commonly known as: ALDACTONE Take 0.5 tablets (12.5 mg total) by mouth daily.   topiramate 25 MG tablet Commonly known as: TOPAMAX Take 1 tablet (25 mg total) by mouth at bedtime.        Follow-up Information     [name], MD Follow up.   Specialty: Internal Medicine Why: Call in 1-2 days for post hospital follow up Contact information: [address] [place] [address] [phone]         Fanny Dance, MD. Call.   Specialty: Physical Medicine and Rehabilitation Why: As needed Contact information: [address] [place] [address] [phone]         Hershal Coria, PsyD Follow up.   Specialty: Psychology Why: office will call you with follow up appointment Contact information: [address] [place] [address] [phone]                 Signed: Jacquelynn Cree 12/30/2022, 2:29 PM

## 2022-12-30 NOTE — Progress Notes (Signed)
Dressing change was done on 11/3 with husband at the bedside. He was able to instruct RN on how they've been doing it previously. RN talked through the steps while doing the dressing change with the patient and the spouse. Dressing was changed again this morning at 0930. Husband walked out of the room as soon as Char[employer] fundraiser started.

## 2022-12-30 NOTE — Progress Notes (Signed)
Inpatient Rehabilitation Discharge Medication Review by a Pharmacist  A complete drug regimen review was completed for this patient to identify any potential clinically significant medication issues.  High Risk Drug Classes Is patient taking? Indication by Medication  Antipsychotic No   Anticoagulant No   Antibiotic/Antifungal No   Opioid Yes Oxycodone - PRN pain  Antiplatelet No   Hypoglycemics/insulin Yes Dapagliflozin - CHF, DM Insulin aspart, metformin, Mounjaro - DM, weightloss  Vasoactive Medication Yes Metoprolol, Losartan, Spironolactone - CHF, HTN Furosemide - CHF  Chemotherapy No   Other Yes Vit C/mag ox - supplement Atorvastatin - HLD Diclofenac gel - topical pain relief Gabapentin - neuropathy Methylphenidate - attention/alertness Alprazolam - PRN anxiety Lidocaine patch - pain Topamax - weakness/pain     Type of Medication Issue Identified Description of Issue Recommendation(s)  Drug Interaction(s) (clinically significant)     Duplicate Therapy     Allergy     No Medication Administration End Date     Incorrect Dose     Additional Drug Therapy Needed     Significant med changes from prior encounter (inform family/care partners about these prior to discharge). Coreg > metoprolol Xigduo Dcd (individually reordered) Entresto > losartan Simvastatin > atorvastatin  Vraylar, escitalopram discontinued due to Qtc prolongation risk Communicate medication changes with patient/family at discharge  Other       Clinically significant medication issues were identified that warrant physician communication and completion of prescribed/recommended actions by midnight of the next day:  No   Pharmacist comments:  would not take her evening doses of any medications; medication regimen for discharge simplified to once daily dosing per cardiology   Time spent performing this drug regimen review (minutes): 20   Ulyses Southward, PharmD, [place], AAHIVP, CPP Infectious Disease  Pharmacist 12/30/2022 7:16 AM

## 2022-12-30 NOTE — Progress Notes (Addendum)
PROGRESS NOTE   Subjective/Complaints:  Patient says she would like to see Dr. Kieth Brightly and outpatient follow-up.  Patient reports that they are not sure how to do wound care.  Discussed with nursing who reviewed wound care again with pt and husband today.  Nursing reports patient and husband knew steps to wound care when this was reviewed again today.   ROS:   Pt denies fever, chills, SOB, abd pain, CP, N/V/C/D, and vision changes  +right sided weakness- improved + chills resolved + Constipation-worse + L knee pain +Anxiety-continued   objective:   No results found. Recent Labs    12/30/22 0608  WBC 8.9  HGB 11.1*  HCT 34.8*  PLT 288    Recent Labs    12/30/22 0608  NA 137  K 3.3*  CL 99  CO2 27  GLUCOSE 128*  BUN 28*  CREATININE 0.93  CALCIUM 9.0     Intake/Output Summary (Last 24 hours) at 12/30/2022 0844 Last data filed at 12/29/2022 1321 Gross per 24 hour  Intake 658 ml  Output --  Net 658 ml          Physical Exam: Vital Signs Blood pressure 111/72, pulse 87, temperature 99.1 F (37.3 C), resp. rate 16, height 5\' 4"  (1.626 m), weight (!) 152.4 kg, SpO2 95%.       General: Sitting in chair HENT: conjugate gaze; oropharynx moist CV: regular rate; no JVD Pulmonary: CTA B/L; no W/R/R- good air movement GI: soft, NT, ND, (+)BS Psychiatric: Awake, anxious  Neuro:  Alert and oriented x3, follows commands, cranial nerves II through XII gorssly intact, no speech or language deficits noted, + memory deficits Sensation intact light touch in all 4 extremities Poor safety awareness.   Strength 4+ out of 5 in left upper extremity Strength 4 out of 5 right upper extremity other then finger flexion 4-/5 Strength 4+ out of 5 right lower extremity Strength 2-3 out of 5 left lower extremity, exam stable 10/27 Musculoskeletal: No joint swelling noted.  She is lifting her arm to gravity without  difficulty, fluctuating weakness in finger flexion strength          Assessment/Plan: 1. Functional deficits which require 3+ hours per day of interdisciplinary therapy in a comprehensive inpatient rehab setting. Physiatrist is providing close team supervision and 24 hour management of active medical problems listed below. Physiatrist and rehab team continue to assess barriers to discharge/monitor patient progress toward functional and medical goals  Care Tool:  Bathing    Body parts bathed by patient: Right arm, Left arm, Chest, Abdomen, Front perineal area, Face, Right upper leg, Left upper leg, Right lower leg   Body parts bathed by helper: Buttocks Body parts n/a: Left lower leg   Bathing assist Assist Level: Minimal Assistance - Patient > 75%     Upper Body Dressing/Undressing Upper body dressing   What is the patient wearing?: Dress    Upper body assist Assist Level: Supervision/Verbal cueing (and Life Vest)    Lower Body Dressing/Undressing Lower body dressing      What is the patient wearing?:  (Dress and Life Vest ONLY)     Lower body assist Assist  for lower body dressing: Supervision/Verbal cueing     Toileting Toileting    Toileting assist Assist for toileting: Minimal Assistance - Patient > 75%     Transfers Chair/bed transfer  Transfers assist     Chair/bed transfer assist level: Independent with assistive device Chair/bed transfer assistive device: Walker, Other (rollator and RW)   Locomotion Ambulation   Ambulation assist      Assist level: Supervision/Verbal cueing Assistive device: Rollator (and RW) Max distance: 150   Walk 10 feet activity   Assist     Assist level: Supervision/Verbal cueing Assistive device: Rollator   Walk 50 feet activity   Assist    Assist level: Supervision/Verbal cueing Assistive device: Rollator    Walk 150 feet activity   Assist Walk 150 feet activity did not occur: Safety/medical  concerns  Assist level: Supervision/Verbal cueing Assistive device: Rollator    Walk 10 feet on uneven surface  activity   Assist Walk 10 feet on uneven surfaces activity did not occur: Safety/medical concerns   Assist level: Supervision/Verbal cueing Assistive device: Rollator   Wheelchair     Assist Is the patient using a wheelchair?: Yes Type of Wheelchair: Manual    Wheelchair assist level: Dependent - Patient 0%      Wheelchair 50 feet with 2 turns activity    Assist        Assist Level: Dependent - Patient 0%   Wheelchair 150 feet activity     Assist      Assist Level: Dependent - Patient 0%   Blood pressure 111/72, pulse 87, temperature 99.1 F (37.3 C), resp. rate 16, height 5\' 4"  (1.626 m), weight (!) 152.4 kg, SpO2 95%.  Medical Problem List and Plan: 1. 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 Continue life vest              -patient may shower             -ELOS/Goals: 10-14 day,  PT/OT supervision to mod I, SLP mod I              -Con't Cir PT, OT and SLP- d/c tomorrow  -Discussed safety with pt, call nurses to get up.  -11/4 expected discharge  -11/5 discharge today  2.  Antithrombotics: -DVT/anticoagulation:  Pharmaceutical: Lovenox             -antiplatelet therapy: N/A 3. Pain Management:  Oxycodone prn for pain control.   -10/24 increase gabapentin to 200 mg 3 times daily, start lidocaine patch for chest wall  -10/25 increase gabapentin to 300 mg 3 times daily  -10/30 pain controlled, continue current regimen  -10/31 x-ray knee with degenerative changes noted.  Continue medications as above for pain.  11/2- Feels like pain comes and goes, fluctuates, but wants more pain meds- d/w pt that cannot increase at this time- can let primary team address on Monday.   11/4 continue current pain regimen 4. Mood/Behavior/Sleep: Team to provide ego support. LCSW to follow for evaluation and support.               --Melatonin prn for insomnia.              -antipsychotic agents: N/A at this time              --Neuropsych for coping skills.  5. Neuropsych/cognition: This patient is likely capable of making decisions on her own behalf. 6. Severe left lower extremity wound: wound examined with nursing  and provided patient education: Added proteiin supplements and Vitamin C to promote wound healing             --BID dressing changes with silvadene per plastics  -10/30 seen by plastic surgery yesterday, calcium alginate for wound care, Keflex started yesterday for redness possible skin infection in skin surrounding wound- discussed with pharmacy mild penicillin allergy,  she has tolerated cephalosporins    11/1 spoke with Dr. Ladona Ridgel on the phone with plastic surgery, Dr. Ladona Ridgel saw patient yesterday.  Planning to start Santyl for wound care with calcium alginate.  Could consider surgical debridement of the eschar, however concern regarding cardiac risk.  I spoke with EP nurse wound will need to be healed for ICD placement.  She recommended using defibrillator paddles during surgery while LifeVest is off.  Called back plastic surgery who has concerns seeding with surgery without cardiology present.  Provided phone number to contact cardiology back.  -11/4 asked nursing to review wound care with patient.  Discussed follow-up with plastic surgery 7. Fluids/Electrolytes/Nutrition: Strict I/O. Low salt diet. Juven and prostat added             -- Recheck CMET tomorrow 8. VT/VF arrest: Life Vest with plans for ICD once leg wound resolves?  9. NICM w/ HFrEF:  GDMT being resumed by Cards--on Aldactone, Lasix, Cozaar, Farxiga and Lipitor             --Daily wts and monitor for signs of overload. Strict I/O   -10/28 cardiology has signed off, follow-up appointment outpatient 11/26 and EPP 11/20  -10/29 will ask for standing weights as do not think current weights are accurate.  Her weight went from low 130s  kilograms 10/23 and prior days to low 150 kg the following day  -10/30 new wt stable from prior, continue to monitor   -No signs of fluid overload at this time  -11/1 weights continue to fluctuate, suspect nursing using bed weight not standing weight as prior days  11/2- Stable compared to yesterday  11/4 weights continue to fluctuate, likely inaccurate Filed Weights   12/26/22 1300 12/27/22 0500 12/28/22 0500  Weight: (!) 136.4 kg (!) 152.4 kg (!) 152.4 kg    10. [name] wound with necrosis: Plastic consulted 10/22-->now on silvadene bid for local care --supplements added to promote wound healing.  11.  T2DM: Hgb A1c-  Was pm metformin/jardiance combo and Monjuaro PTA             --monitor BS ac/hs. AKI has resolved-->resume low dose metformin and titrate as indicated  -11/1 CBGs controlled, hold off on Mounjaro  CBG (last 3)  Recent Labs    12/29/22 1724 12/29/22 2035 12/30/22 0613  GLUCAP 90 133* 120*   11/2- Bgs controlled- con't regimen  11/4 CBGs controlled continue current regimen 12.  Acute renal failure vs CKD IIIa: Resolving   -BUN and creatinine stable at 22/1.07 13. H/o severe recurrent depression w/anxiety: Vraylar and Lexapro discontinued due to concerns of QT prolongation             --Is being followed by behavioral health/Dr. Elsie Saas. Had [facility] 09/25/22 --continue Xanax prn   -11/1 psychiatry consulted,Vraylar and Lexapro were stopped due to concerns of QT prolongation previously discussed this with patient -11/4 discussed with Dr. Kieth Brightly, will place consult for outpatient follow-up 14. OSA: Continue CPAP 15. H/o Lumbar spinal stenosis and  R-knee pain:  Resume gabapentin (mood and pain). She was going to outpatient therapy.  16. Morbid Obesity BMI-50.5: Multifactorial. Encourage/educate on life style and diet changes to help with wt loss and improve overall activity and health. .  17. ADD: Discussed need for activating medication (mood and energy per  char review)--has ritalin on home meds but just approved  --Continue Ritalin added per secure chat with Dr. Bjorn Pippin.  11/3- Psychiatry still following- saw today went over their note- she's to f/u with behavioral health after d/c.  18. Vaginal discomfort: Likely due to candidiasis --miconazole added for treatment.  19.  Hyperlipidemia: continue statin  20.  Right upper extremity pain/right shoulder pain  -Will check x-ray of her right shoulder as this seems to be the greatest location of pain at this time.  Suspect musculoskeletal pain due to increased activity.  21.  Bilateral jaw pain.   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  She has pain with movement and tenderness to palpation of her jaw.  Continue voltaren gel  22.  Chills reported yesterday.  Reports this has resolved.  She has been afebrile  -10/28 WBC within normal limits today  23. Hypotension  Losartan dose was decreased to 12.5 mg daily  -11/1 BP control  11/2- BP on low side 90s/50s- so fa,r wasn't dizzy, so will monitor.   11/4 BP stable continue to monitor    12/30/2022    5:07 AM 12/29/2022    8:01 PM 12/29/2022   12:42 PM  Vitals with BMI  Systolic 111 109 99  Diastolic 72 75 71  Pulse 87 89 88     24. Right arm weakness/pain: -cervical spin XR ordered and discussed that it shows multilevel spondylosis anf facet arthropathy. Attempted to order MRI cervical spine on Saturday but she is unable to have LifeVest off for more than 10 minutes, discussed steroids to reduced swelling in neck but she defers due to risks of worsening infection and weight gain, discussed increase in gabapentin or topamax and she chooses to try topamax, discussed that right arm symptoms improved temporarily with therapy, encouraged continued physical therapy to decompress spine, discussed that putting a lot of pressure while holding the walker could contribute.  -10/30-11/1improved, continue to monitor   11/2- asking for more pain meds,  25. Dry Mouth  -10/28  Discussed with pharmacy, no recent medications that very likely to cause this, will start biotene   11/2- still having dry mouth, but just woke up 26.  Constipation.    -Schedule miralax  -Will order sorbitol 60ml 10/29  -10/30 LBM yesterday but amount not know, continue miralax, will check xray abdomen regarding stool burden  -10/31 moderate stool burden on x-ray yesterday.  She had additional dose of sorbitol yesterday afternoon.  MiraLAX increased to twice daily.  Will add Senokot at bedtime.  -11/1 continue additional medication if no BM today  11/2- having "tiny BM's"- will add Sorbitol as needed daily prn- so can use if need be for bowels.   11/3- large BM last night- took sorbitol 27. OSA-   11/3- Psychiatry suggested flonase nightly to improve CPAP compliance- will try QHS 28. Hypokalemia  -Order one time     LOS: 13 days A FACE TO FACE EVALUATION WAS PERFORMED  Fanny Dance 12/30/2022, 8:44 AM

## 2022-12-30 NOTE — Progress Notes (Signed)
Patient's spouse was educated on dressing changes. He was able to demonstrate proper technique before patient discharged. Supplies were provided for discharge.

## 2022-12-30 NOTE — Progress Notes (Signed)
Inpatient Rehabilitation Care Coordinator Discharge Note   Patient Details  Name: [name] MRN: [id number] Date of Birth: Mar 01, 1965   Discharge location: HOME WITH HUSBAND WHO IS WITH HER 24/7 AND PROVIDED CARE PRIOR TO ADMISSION  Length of Stay: 13 DAYS  Discharge activity level: SUPERVISION LEVEL  Home/community participation: ACTIVE  Patient response QI:ONGEXB Literacy - How often do you need to have someone help you when you read instructions, pamphlets, or other written material from your doctor or pharmacy?: Never  Patient response MW:UXLKGM Isolation - How often do you feel lonely or isolated from those around you?: Sometimes  Services provided included: MD, RD, PT, OT, SLP, RN, CM, TR, Pharmacy, Neuropsych, SW  Financial Services:  Field seismologist Utilized: Medicare    Choices offered to/list presented to: PT AND HUSBAND  Follow-up services arranged:  Home Health, DME, Patient/Family has no preference for HH/DME agencies Home Health Agency: [facility]   PT  OT  SP  RN    DME : [facility]ALK, DROP-ARM BEDSIDE COMMODE AND TUB BENCH REFERRAL MADE TO FOLLOW UP WITH DR. Talmadge Chad AT OP OFFICE    Patient response to transportation need: Is the patient able to respond to transportation needs?: Yes In the past 12 months, has lack of transportation kept you from medical appointments or from getting medications?: No In the past 12 months, has lack of transportation kept you from meetings, work, or from getting things needed for daily living?: No   Patient/Family verbalized understanding of follow-up arrangements:  Yes  Individual responsible for coordination of the follow-up plan: [name]-HUSBAND [phone]  Confirmed correct DME delivered: Lucy Chris 12/30/2022    Comments (or additional information):HUSBAND AND SON WERE HERE DAILY AND ARE AWARE OF PT'S CARE NEEDS. VERY SELF LIMITING AND NEEDS MUCH ENCOURAGEMENT TO DO FOR  HERSELF  Summary of Stay    Date/Time Discharge Planning CSW  12/25/22 0858 HOme with husband who was assisting with her care prior to admission. Was attending OP at [facility]. Await team's recommendations RGD  12/18/22 0909 New evaluation home with husband need to confirm if will have 24/7 care RGD       [name], Lemar Livings

## 2022-12-30 NOTE — Progress Notes (Signed)
Patient ID: [name], female   DOB: 01/04/1966, 57 y.o.   MRN: [id number] received PT recommendation for rolling walker this am. Have placed referral to [facility] and hopefully can be delivered prior to pt's discharge today

## 2022-12-30 NOTE — Progress Notes (Signed)
Heart Failure Navigator Progress Note  Original Heart Failure TOC appointment rescheduled because patient is still in the hospital.  New appointment 01/14/23 @ 2:00 pm  Patient and family aware of appointment change an understand all.  Heart Failure Education reviewed again with patient and family.  Navigator will sign off at this time.  Roxy Horseman, RN, BSN [facility] Heart Failure Navigator Secure Chat Only

## 2022-12-31 ENCOUNTER — Ambulatory Visit (HOSPITAL_COMMUNITY): Payer: Medicare Other | Admitting: Mental Health

## 2023-01-01 ENCOUNTER — Ambulatory Visit (HOSPITAL_BASED_OUTPATIENT_CLINIC_OR_DEPARTMENT_OTHER): Payer: Medicare Other | Admitting: Physical Therapy

## 2023-01-01 ENCOUNTER — Encounter (HOSPITAL_COMMUNITY): Payer: Self-pay | Admitting: Physician Assistant

## 2023-01-01 ENCOUNTER — Ambulatory Visit (INDEPENDENT_AMBULATORY_CARE_PROVIDER_SITE_OTHER): Payer: Medicare Other | Admitting: Physician Assistant

## 2023-01-01 DIAGNOSIS — E1169 Type 2 diabetes mellitus with other specified complication: Secondary | ICD-10-CM | POA: Diagnosis not present

## 2023-01-01 DIAGNOSIS — F418 Other specified anxiety disorders: Secondary | ICD-10-CM | POA: Diagnosis not present

## 2023-01-01 DIAGNOSIS — E785 Hyperlipidemia, unspecified: Secondary | ICD-10-CM | POA: Diagnosis not present

## 2023-01-01 DIAGNOSIS — M48061 Spinal stenosis, lumbar region without neurogenic claudication: Secondary | ICD-10-CM | POA: Diagnosis not present

## 2023-01-01 DIAGNOSIS — Z7984 Long term (current) use of oral hypoglycemic drugs: Secondary | ICD-10-CM | POA: Diagnosis not present

## 2023-01-01 DIAGNOSIS — F419 Anxiety disorder, unspecified: Secondary | ICD-10-CM | POA: Diagnosis not present

## 2023-01-01 DIAGNOSIS — F988 Other specified behavioral and emotional disorders with onset usually occurring in childhood and adolescence: Secondary | ICD-10-CM | POA: Diagnosis not present

## 2023-01-01 DIAGNOSIS — I5032 Chronic diastolic (congestive) heart failure: Secondary | ICD-10-CM | POA: Diagnosis not present

## 2023-01-01 DIAGNOSIS — T8089XA Other complications following infusion, transfusion and therapeutic injection, initial encounter: Secondary | ICD-10-CM | POA: Diagnosis not present

## 2023-01-01 DIAGNOSIS — Z7985 Long-term (current) use of injectable non-insulin antidiabetic drugs: Secondary | ICD-10-CM | POA: Diagnosis not present

## 2023-01-01 DIAGNOSIS — F332 Major depressive disorder, recurrent severe without psychotic features: Secondary | ICD-10-CM | POA: Diagnosis not present

## 2023-01-01 DIAGNOSIS — E1165 Type 2 diabetes mellitus with hyperglycemia: Secondary | ICD-10-CM | POA: Diagnosis not present

## 2023-01-01 DIAGNOSIS — Z8674 Personal history of sudden cardiac arrest: Secondary | ICD-10-CM | POA: Diagnosis not present

## 2023-01-01 DIAGNOSIS — E1122 Type 2 diabetes mellitus with diabetic chronic kidney disease: Secondary | ICD-10-CM | POA: Diagnosis not present

## 2023-01-01 DIAGNOSIS — N1831 Chronic kidney disease, stage 3a: Secondary | ICD-10-CM | POA: Diagnosis not present

## 2023-01-01 DIAGNOSIS — Z6841 Body Mass Index (BMI) 40.0 and over, adult: Secondary | ICD-10-CM | POA: Diagnosis not present

## 2023-01-01 DIAGNOSIS — I959 Hypotension, unspecified: Secondary | ICD-10-CM | POA: Diagnosis not present

## 2023-01-01 DIAGNOSIS — T8189XA Other complications of procedures, not elsewhere classified, initial encounter: Secondary | ICD-10-CM | POA: Diagnosis not present

## 2023-01-01 DIAGNOSIS — Z87891 Personal history of nicotine dependence: Secondary | ICD-10-CM | POA: Diagnosis not present

## 2023-01-01 DIAGNOSIS — E1159 Type 2 diabetes mellitus with other circulatory complications: Secondary | ICD-10-CM | POA: Diagnosis not present

## 2023-01-01 DIAGNOSIS — I080 Rheumatic disorders of both mitral and aortic valves: Secondary | ICD-10-CM | POA: Diagnosis not present

## 2023-01-01 DIAGNOSIS — I152 Hypertension secondary to endocrine disorders: Secondary | ICD-10-CM | POA: Diagnosis not present

## 2023-01-01 DIAGNOSIS — I5023 Acute on chronic systolic (congestive) heart failure: Secondary | ICD-10-CM | POA: Diagnosis not present

## 2023-01-01 DIAGNOSIS — I96 Gangrene, not elsewhere classified: Secondary | ICD-10-CM | POA: Diagnosis not present

## 2023-01-01 NOTE — Progress Notes (Signed)
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  Prior to this encounter, patient was admitted to the hospital [name] ED) on 12/05/2022 after suffering cardiac arrest.  Prior to her admission to [facility], patient was being managed on the following psychiatric medications:  Ritalin 5 mg 2 times daily Vraylar 1.5 mg daily Gabapentin 100 mg 3 times daily Lexapro 10 mg daily  Per chart review, prior prior to the arrival of EMS, patient has been had attempted CPR.  Once EMS had arrived, patient was found to be in VT/VF and ACLS was administered.  It was noted that patient had several arrests totaling up to 40 minutes each noted to be VT/V-fib.  IV access was obtained with the use of a left leg intraosseous placement.  Patient has been intubated for airway protection admitted by PCCM on Levophed for shock.  An echocardiogram was performed on 10/12 noting EF to be 30 to 35% with normal RV function.  Patient was noted to have Wieting wounds with signs of skin necrosis of the left lower extremity where the interosseous cannula has been placed.  Orthopedics was consulted, but felt that there were no signs of compartment syndrome.  There were concerns of skin process involving a large area of the anterior aspect of the lower left leg to be followed up with Dr. Lajoyce Corners or plastic surgery.  Patient had a cardiac cath on 10/14 which showed normal coronaries.  A cardiac MRI was obtained on 10/17 with noted LVEF of 31% with RV EF 50%.  Patient was able to be extubated and  transitioned out of the ICU.  TRH called to evaluate for management of left leg wound and to assume care.    While in the hospital, patient noted that she felt like she has been hit by a [employer].  Since the incident, patient feels like her memory is significantly poor and she also has severe pain in her right leg.  Patient was also admitted to rehab on 12/17/2022 for inpatient therapies consisting of physical therapy, speech therapy, and occupational therapy for at least 3 hours / 5 days a week.  Blood pressures were monitored on a 3 times daily basis, starting dose was decreased to help prevent hypotension.  Ritalin was added at admission per discussion with cardiology to help with mood and her attention.  Lower extremity edema was managed with elevation as well as recommendations for depression of the lower left extremity.  Patient's diabetes was monitored with before meals/at bedtime CBG checks and SSI was used as needed for tighter he has control.  Plastics was consulted) and evaluation during her stay.  Patient was also assessed by psychiatry while in the hospital.  Patient was confirmed has a diagnosis of major depressive disorder, generalized anxiety disorder, adjustment disorder (with depressed and anxious mood).  During the time of her assessment, patient was on the following psychiatric medications: Trazodone, Ritalin, and Xanax as needed.  While assessed by psychiatry, patient stated that she was aware of the risk of psychiatric medications and believes that the risk outweigh the benefit.  Patient presents to  this encounter following discharge from the hospital.  She reports that she was discharged from the hospital the day before yesterday (Monday).  Patient reports that her depression and anxiety are at peak levels due to her past cardiac arrest incident.  She endorses elevated anxiety stating that she is overwhelmed.  She reports that she has been going through a lot since being discharged  from the hospital and states that she has several appointments to attend.  She reports that she feels like she is on overload.  She mentions engaging in mindfulness session to alleviate her current anxiousness.  Patient reports that her life is currently messed and she is in a lot of pain.  She reports that she often has to rely on her husband for help.  Patient informed provider that she was disappointed that her therapist appointments got canceled even though she never canceled the appointment herself.  She has a new appointment for therapy scheduled for January but states that she will not be able to make it to that appointment.  She describes her life as being upside down since her cardiac arrest.  Patient felt like the questions for a PHQ-9 screen and GAD-7 screen were not appropriate for her due to her current mood.  She does endorse immense depression due to her current circumstance and states that she is under immense pain.  Since being discharged from the hospital, patient is unsure of the psychotropic medications that she is supposed to be on.  Upon reviewing the psychiatric consult note that was written during patient's hospitalization, patient was taking trazodone, Ritalin, and Xanax as needed.  There were noted concerns over patient's use of certain psychotropic medications and risk for QTc prolongation.  It was noted that the following antidepressants have a lower risk of QTc prolongation at therapeutic doses: Bupropion, vortioxetine, lurasidone, trazodone, duloxetine, desvenlafaxine, paroxetine, fluoxetine, sertraline, and fluoxetine.  Patient has been on the following psychiatric medications in the past: BuSpar, Abilify, Klonopin, Zoloft, Prozac, Lamictal, and Cymbalta.  Based off the psych consult note, the following psychiatric medication recommendations were made for the patient:  - Repeat ECG prior to starting new psychotropic medications - Consider restarting Trintellix at 5 mg daily  since patient reportedly tolerated medication in the past but found the medication ineffective.  Patient does not believe that she was on the medication for long enough prior to discontinuation. - Consider Vilazodone - Consider hydroxyzine 25 mg 3 times daily as needed for anxiety/sleep - Consider Topamax 25 mg twice daily for mood  Per psych consult note, Xanax could be continued as needed for anxiety while hospitalized.  If signs of respiratory depression, hypoventilation, or worsening obstructive sleep apnea occurred while on Xanax, then it was recommended that Xanax be discontinued.  If patient should be considered for continued Xanax treatment (benzodiazepine treatment), then it was suggested that CPAP compliance was established and documented while using Xanax.  The initiation of Flonase 2 sprays each nostril before bedtime was considered to help improve CPAP compliance.  It was noted that patient had restarted Ritalin 5 mg twice daily with breakfast and lunch per cardiology.  This medication was prescribed by her outpatient psychiatric provider after approval from her outpatient cardiologist.  It should be noted that the stimulant such as methylphenidate, should be avoided due to their effects on the cardiovascular system.  These medications should also be avoided in patients with a history of arrhythmia or structural heart defects.  Patient is alert and oriented x 4, restless  yet cooperative, and engaged in conversation during the encounter.  Patient denies suicidal or homicidal ideations.  She further denies auditory or visual hallucinations and does not appear to be responding to internal/external stimuli.  Patient states that she has been sleeping more than usual.  Patient endorses decreased appetite but states that she has been eating at least 2 meals per day.  Patient denies alcohol consumption, tobacco use, or illicit drug use.  Visit Diagnosis:    ICD-10-CM   1. Major depressive disorder,  recurrent episode, severe with anxious distress (HCC)  F33.2     2. Anxiety  F41.9     3. 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   Past Medical History:  Past Medical History:  Diagnosis Date   ADD (attention deficit disorder)    Anxiety    Arthritis    both knees right worse than left   Carpal tunnel syndrome of right wrist    Depression    Essential hypertension, benign    Gallstones    History of smoking 06/22/2016   Hyperlipidemia associated with type 2 diabetes mellitus (HCC), on Zocor 04/04/2011   Hypertension associated with diabetes (HCC) 06/03/2008   Migraines    Morbid obesity (HCC)    Morbid obesity with BMI of 50.0-59.9, adult (HCC) 07/12/2006   NICM (nonischemic cardiomyopathy) (HCC) 07/12/2006   01/16/18 ECHO:    - Procedure narrative: Transthoracic echocardiography. Image   quality was suboptimal. The study was technically difficult.   Intravenous contrast (Definity) was administered. - Left ventricle: The cavity size was moderately dilated. Wall   thickness was increased in a pattern of mild LVH. Systolic   function was moderately to severely reduced. The estimated   ejection fraction w   Normal coronary arteries 04/17/2016   OSA on CPAP 10/28/2014   Spinal stenosis    back pain   Spondylosis without myelopathy or radiculopathy, lumbar region 12/04/2017   Type 2 diabetes mellitus with hyperglycemia [facility])     Past Surgical History:  Procedure Laterality Date   CHOLECYSTECTOMY     DILATATION & CURETTAGE/HYSTEROSCOPY WITH MYOSURE N/A 10/31/2017   Procedure: DILATATION & CURETTAGE/HYSTEROSCOPY WITH MYOSURE;  Surgeon: Romualdo Bolk, MD;  Location: [facility] ORS;  Service: Gynecology;  Laterality: N/A;   HYSTEROSCOPY WITH D & C N/A 05/07/2022   Procedure: DILATATION AND CURETTAGE /HYSTEROSCOPY;  Surgeon:  [name], MD;  Location: [facility] OR;  Service: Gynecology;  Laterality: N/A;   RIGHT/LEFT HEART CATH AND CORONARY ANGIOGRAPHY N/A 04/11/2016   Procedure: Right/Left Heart Cath and Coronary Angiography;  Surgeon: Kathleene Hazel, MD;  Location: [facility] INVASIVE CV LAB;  Service: Cardiovascular;  Laterality: N/A;   RIGHT/LEFT HEART CATH AND CORONARY ANGIOGRAPHY N/A 12/09/2022   Procedure: RIGHT/LEFT HEART CATH AND CORONARY ANGIOGRAPHY;  Surgeon: Yates Decamp, MD;  Location: [facility] INVASIVE CV LAB;  Service: Cardiovascular;  Laterality: N/A;   sonogram for blood clots     no blockages    Family Psychiatric History:  Patient reports that her mother had several psych issues including depression, anxiety, and possible OCD Father - OCD Son - Asperger's, ADD Grandmother (ma) - depression, anxiety. History of some form of electric/stimulation therapy 2 Uncles - depression, anxiety 2nd cousins - OCD, ADD, depression, anxiety   Family history of suicide attempt: Patient reports that her grandmother's brother's daughter attempted suicide Family  history of homicide attempt: Patient denies Family history of substance abuse: Patient denies  Family History:  Family History  Problem Relation Age of Onset   Depression Mother    Anxiety disorder Mother    Diabetes Mother    Hypertension Mother    ADD / ADHD Father    Diabetes Father    Hypertension Father    Colitis Father    Hypertension Other    Diabetes Other    Colitis Other    Alcohol abuse Other     Social History:  Social History   Socioeconomic History   Marital status: Married    Spouse name: [name]   Number of children: 1   Years of education: Not on file   Highest education level: Associate degree: academic program  Occupational History   Not on file  Tobacco Use   Smoking status: Former    Current packs/day: 0.00    Types: Cigarettes    Quit date: 02/25/1997    Years since quitting: 25.8   Smokeless tobacco: Never    Tobacco comments:    Married, lives with spouse (when he is not traveling) and son  Vaping Use   Vaping status: Never Used  Substance and Sexual Activity   Alcohol use: No   Drug use: No   Sexual activity: Not Currently    Partners: Male    Birth control/protection: None  Other Topics Concern   Not on file  Social History Narrative   Not on file   Social Determinants of Health   Financial Resource Strain: Low Risk  (12/11/2022)   Overall Financial Resource Strain (CARDIA)    Difficulty of Paying Living Expenses: Not hard at all  Food Insecurity: No Food Insecurity (12/06/2022)   Hunger Vital Sign    Worried About Running Out of Food in the Last Year: Never true    Ran Out of Food in the Last Year: Never true  Transportation Needs: No Transportation Needs (12/11/2022)   PRAPARE - Administrator, Civil Service (Medical): No    Lack of Transportation (Non-Medical): No  Physical Activity: Inactive (11/12/2022)   Exercise Vital Sign    Days of Exercise per Week: 0 days    Minutes of Exercise per Session: 0 min  Stress: Stress Concern Present (11/12/2022)   [employer] of Occupational Health - Occupational Stress Questionnaire    Feeling of Stress : Very much  Social Connections: Socially Isolated (08/08/2021)   Social Connection and Isolation Panel [NHANES]    Frequency of Communication with Friends and Family: Once a week    Frequency of Social Gatherings with Friends and Family: Once a week    Attends Religious Services: Never    Database administrator or Organizations: No    Attends Banker Meetings: Never    Marital Status: Married    Allergies:  Allergies  Allergen Reactions   Fluoxetine Other (See Comments)    More depressed   Cymbalta [Duloxetine Hcl] Other (See Comments)    depressed   Pork-Derived Products    Penicillins Rash    Has patient had a PCN reaction causing immediate rash, facial/tongue/throat swelling, SOB or  lightheadedness with hypotension: YES Has patient had a PCN reaction causing severe rash involving mucus membranes or skin necrosis: NO Has patient had a PCN reaction that required hospitalization: YES Has patient had a PCN reaction occurring within the last 10 years: NO If all of the above answers are "NO", then may  proceed with Cephalosporin use.     Metabolic Disorder Labs: Lab Results  Component Value Date   HGBA1C 6.6 (H) 12/07/2022   MPG 142.72 12/07/2022   MPG 140 05/02/2022   No results found for: "PROLACTIN" Lab Results  Component Value Date   CHOL 194 08/13/2022   TRIG 208 (H) 12/06/2022   HDL 48.90 08/13/2022   CHOLHDL 4 08/13/2022   VLDL 27.6 08/13/2022   LDLCALC 117 (H) 08/13/2022   LDLCALC 86 03/01/2021   Lab Results  Component Value Date   TSH 2.130 05/16/2022   TSH 4.88 08/09/2021    Therapeutic Level Labs: No results found for: "LITHIUM" No results found for: "VALPROATE" No results found for: "CBMZ"  Current Medications: Current Outpatient Medications  Medication Sig Dispense Refill   acetaminophen (TYLENOL) 325 MG tablet Take 1-2 tablets (325-650 mg total) by mouth every 4 (four) hours as needed for mild pain (pain score 1-3). 100 tablet 0   ALPRAZolam (XANAX) 0.5 MG tablet Take 1 tablet (0.5 mg total) by mouth 2 (two) times daily as needed for anxiety (if anxiety unrelieved by hydroxyzine). 30 tablet 0   ascorbic acid (VITAMIN C) 500 MG tablet Take 1 tablet (500 mg total) by mouth daily. 30 tablet 0   atorvastatin (LIPITOR) 80 MG tablet Take 1 tablet (80 mg total) by mouth daily. 30 tablet 0   Blood Glucose Monitoring Suppl DEVI 1 each by Does not apply route in the morning, at noon, and at bedtime. May substitute to any manufacturer covered by patient's insurance. 1 each 0   cephALEXin (KEFLEX) 500 MG capsule Take 1 capsule (500 mg total) by mouth every 8 (eight) hours. 21 capsule 0   collagenase (SANTYL) 250 UNIT/GM ointment Apply 1 gram topically 2  (two) times daily to area on knee, cover with calcium alginate, 4 X 4 and kerlix 60 g 0   dapagliflozin propanediol (FARXIGA) 10 MG TABS tablet Take 1 tablet (10 mg total) by mouth daily. 30 tablet 0   diclofenac Sodium (VOLTAREN) 1 % GEL Apply 2 g topically 4 (four) times daily. To jaw for TMJ symptoms 350 g 0   fluticasone (FLONASE) 50 MCG/ACT nasal spray Place 2 sprays into both nostrils at bedtime. 16 g 0   furosemide (LASIX) 40 MG tablet Take 1 tablet (40 mg total) by mouth daily. 30 tablet 0   gabapentin (NEURONTIN) 300 MG capsule Take 1 capsule (300 mg total) by mouth 3 (three) times daily. 90 capsule 0   Glucose Blood (BLOOD GLUCOSE TEST STRIPS) STRP 1 each by In Vitro route in the morning, at noon, and at bedtime. May substitute to any manufacturer covered by patient's insurance. 100 strip 11   lidocaine (LIDODERM) 5 % Place 2 patches onto the skin daily. Remove & Discard patch within 12 hours or as directed by MD 60 patch 0   losartan (COZAAR) 25 MG tablet Take 0.5 tablets (12.5 mg total) by mouth daily. 30 tablet 0   magnesium oxide (MAG-OX) 400 MG tablet Take 1 tablet (400 mg total) by mouth at bedtime. 30 tablet 0   metFORMIN (GLUCOPHAGE) 500 MG tablet Take 0.5 tablets (250 mg total) by mouth 2 (two) times daily with a meal. 30 tablet 0   methylphenidate (RITALIN) 5 MG tablet Take 1 tablet (5 mg total) by mouth 2 (two) times daily with breakfast and lunch. 60 tablet 0   metoprolol succinate (TOPROL-XL) 50 MG 24 hr tablet Take 1 tablet (50 mg total) by mouth daily.  Take with or immediately following a meal. 30 tablet 0   Multiple Vitamin (MULTIVITAMIN WITH MINERALS) TABS tablet Take 1 tablet by mouth daily. 30 tablet 0   naloxone (NARCAN) nasal spray 4 mg/0.1 mL Use in case of overdose 2 each 1   NON FORMULARY Pt uses cpap nightly     nutrition supplement, JUVEN, (JUVEN) PACK Take 1 packet by mouth 2 (two) times daily between meals. 60 packet 0   Oxycodone HCl 10 MG TABS Take 0.5-1  tablets (5-10 mg total) by mouth every 6 (six) hours as needed for severe pain (pain score 7-10). 15 tablet 0   polyethylene glycol powder (GLYCOLAX/MIRALAX) 17 GM/SCOOP powder Take 1 capful (17 g) in water 2 (two) times daily. 238 g 0   potassium chloride SA (KLOR-CON M) 20 MEQ tablet Take 1 tablet (20 mEq total) by mouth daily. 30 tablet 0   senna-docusate (SENOKOT-S) 8.6-50 MG tablet Take 2 tablets by mouth at bedtime. 60 tablet 0   spironolactone (ALDACTONE) 25 MG tablet Take 0.5 tablets (12.5 mg total) by mouth daily. 30 tablet 0   tirzepatide (MOUNJARO) 2.5 MG/0.5ML Pen INJECT 2.5 MG SUBCUTANEOUSLY WEEKLY (Patient not taking: Reported on 12/05/2022) 2 mL 0   topiramate (TOPAMAX) 25 MG tablet Take 1 tablet (25 mg total) by mouth at bedtime. 30 tablet 0   No current facility-administered medications for this visit.     Musculoskeletal: Strength & Muscle Tone: within normal limits Gait & Station: normal Patient leans: N/A  Psychiatric Specialty Exam: Review of Systems  Psychiatric/Behavioral:  Positive for decreased concentration, dysphoric mood and sleep disturbance. Negative for hallucinations, self-injury and suicidal ideas. The patient is nervous/anxious. The patient is not hyperactive.     There were no vitals taken for this visit.There is no height or weight on file to calculate BMI.  General Appearance: Casual  Eye Contact:  Good  Speech:  Clear and Coherent and Normal Rate  Volume:  Normal  Mood:  Anxious and Depressed  Affect:  Congruent  Thought Process:  Coherent, Goal Directed, and Descriptions of Associations: Intact  Orientation:  Full (Time, Place, and Person)  Thought Content: Tangential   Suicidal Thoughts:  No  Homicidal Thoughts:  No  Memory:  Immediate;   Fair Recent;   Fair Remote;   Fair  Judgement:  Good  Insight:  Present  Psychomotor Activity:  Restlessness due to pain  Concentration:  Concentration: Fair and Attention Span: Fair  Recall:  [employer] of Knowledge: Good  Language: Good  Akathisia:  No  Handed:  Right  AIMS (if indicated): not done  Assets:  Communication Skills Desire for Improvement Financial Resources/Insurance Housing Intimacy Social Support Transportation  ADL's:  Intact  Cognition: WNL  Sleep:  Fair, patient reports that she has been receiving more sleep   Screenings: AUDIT    Flowsheet Row [facility]  Alcohol Use Disorder Identification Test Final Score (AUDIT) 0      GAD-7    Flowsheet Row Clinical Support from 12/05/2022 in [facility] Office Visit from 10/24/2022 in [facility] Office Visit from 06/19/2022 in Center for [facility] Office Visit from 05/02/2022 in [facility] for [facility] at [facility] Office Visit from 08/01/2021 in [facility] for [facility] Healthcare at [facility]  Total GAD-7 Score 16 14 17 17  15  PHQ2-9    Flowsheet Row Clinical Support from 12/05/2022 in [facility] from 11/12/2022 in [facility] Office Visit from 10/24/2022 in [facility] Office Visit from 06/19/2022 in Center for [facility] at [facility] Office Visit from 05/02/2022 in [facility] for [facility]  PHQ-2 Total Score 6 3 6 6 6   PHQ-9 Total Score 21 10 23 21 20       Flowsheet Row Clinical Support from 01/01/2023 in [facility] Admission (Discharged) from 12/17/2022 in [facility]M [facility] CENTER B ED to Hosp-Admission (Discharged) from 12/05/2022 in [facility] 3E HF PCU  C-SSRS RISK CATEGORY Moderate Risk No Risk No Risk        Assessment and Plan:   Kenlie Seki is a  57 year old female with a past psychiatric history significant for major depressive disorder (severe, with anxious distress), anxiety, and 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  Prior to this encounter, patient was admitted to the hospital [name] ED) on 12/05/2022 after suffering cardiac arrest.  Patient's hospital course while hospitalized can be reviewed in patient's HPI.    During the assessment, patient reported being overly anxious and overwhelmed.  She endorsed worsening depression due to her current circumstance attributed to her past cardiac arrest event.  She describes herself as being on overload and is currently in a lot of pain especially in her left lower extremity.  Patient expressed disappointment in having her therapy appointments from yesterday canceled.  She reports that she is unsure if she will be able to make it to January which is when her next therapy appointment is scheduled.  Patient is unsure of what medication she is currently taking at this time stating that she has numerous appointments that she has to attend and she is on numerous medications at this time.  Provider informed patient that her chart will be reviewed to determine what psychotropic medications she should be taking.  During her hospitalization, patient was seen by psychiatry for consult. Upon reviewing the psychiatric consult note that was written during patient's hospitalization, patient was taking trazodone, Ritalin, and Xanax as needed at the time.  There were noted concerns over patient's use of certain psychotropic medications and risk for QTc prolongation.  It was noted that the following antidepressants have a lower risk of QTc prolongation at therapeutic doses: Bupropion, vortioxetine, lurasidone, trazodone, duloxetine, desvenlafaxine, paroxetine, fluoxetine, sertraline, and  fluoxetine.  Based off the psych consult note, the following psychiatric medication recommendations were made for the patient:  - Repeat ECG prior to starting new psychotropic medications - Consider restarting Trintellix at 5 mg daily since patient reportedly tolerated medication in the past but found the medication ineffective.  Patient does not believe that she was on the medication for long enough prior to discontinuation. - Consider Vilazodone - Consider hydroxyzine 25 mg 3 times daily as needed for anxiety/sleep - Consider Topamax 25 mg twice daily for mood  - Xanax could be continued as needed for anxiety while hospitalized.  If signs of respiratory depression, hypoventilation, or worsening obstructive sleep apnea occurred while on Xanax, then it was recommended that Xanax be discontinued. If patient should be considered for continued Xanax treatment (benzodiazepine treatment), then it was suggested that CPAP compliance was established and documented while using Xanax.  The initiation of Flonase 2 sprays each nostril before bedtime was considered to help improve CPAP compliance.  - Patient is currently on Ritalin 5 mg daily with breakfast and lunch per cardiology.  Provider had spoken with patient's outpatient cardiologist and was given approval for patient to use the medication. Ritalin Cardiovascular Precautions: Avoid use in patients with structural cardiac abnormalities or other serious cardiac conditions (eg, cardiomyopathy, serious heart arrhythmia, coronary artery disease); sudden death has been reported in patients with cardiac abnormalities or other serious disease    # Further work up -  -- Check Fe,TSH, B12, folate or While pt on Qtc prolonging medications, please monitor & replete K+ to 4 and Mg2+ to 2 -- will add Vitamin D level as add on to  labs from 12/26/2022.  If unable to be added, can be drawn with next labs.  If low, would recommend supplementation  Collaboration of Care:  Collaboration of Care: Medication Management AEB provider managing patient's psychiatric medications, Primary Care Provider AEB patient being seen by internal medicine, Psychiatrist AEB patient being followed by mental health provider at this facility, Other provider involved in patient's care AEB patient being seen by cardiology, podiatry, and neurology, and Referral or follow-up with counselor/therapist AEB patient is set up with a licensed clinical social worker at this facility  Patient/Guardian was advised Release of Information must be obtained prior to any record release in order to collaborate their care with an outside provider. Patient/Guardian was advised if they have not already done so to contact the registration department to sign all necessary forms in order for [place] to release information regarding their care.   Consent: Patient/Guardian gives verbal consent for treatment and assignment of benefits for services provided during this visit. Patient/Guardian expressed understanding and agreed to proceed.   1. Major depressive disorder, recurrent episode, severe with anxious distress [facility]) Provider to discuss options regarding patient's medication management  2. Anxiety Provider to discuss options regarding patient's medication management  3. ADD (attention deficit disorder) without hyperactivity Provider to discuss options regarding patient's medication management  Patient to follow up in 4 weeks Provider spent a total of 37 minutes with the patient/reviewing patient's chart  Meta Hatchet, PA 01/02/2023, 9:13 AM

## 2023-01-02 DIAGNOSIS — I5023 Acute on chronic systolic (congestive) heart failure: Secondary | ICD-10-CM | POA: Diagnosis not present

## 2023-01-02 DIAGNOSIS — T8189XA Other complications of procedures, not elsewhere classified, initial encounter: Secondary | ICD-10-CM | POA: Diagnosis not present

## 2023-01-02 DIAGNOSIS — I5032 Chronic diastolic (congestive) heart failure: Secondary | ICD-10-CM | POA: Diagnosis not present

## 2023-01-02 DIAGNOSIS — I152 Hypertension secondary to endocrine disorders: Secondary | ICD-10-CM | POA: Diagnosis not present

## 2023-01-02 DIAGNOSIS — T8089XA Other complications following infusion, transfusion and therapeutic injection, initial encounter: Secondary | ICD-10-CM | POA: Diagnosis not present

## 2023-01-02 DIAGNOSIS — I96 Gangrene, not elsewhere classified: Secondary | ICD-10-CM | POA: Diagnosis not present

## 2023-01-02 NOTE — Telephone Encounter (Signed)
Patient is out of the hospital and would like it if you can call her and go over her results. She does not have access to the MyChart and is unable to see what you've sent her.Patient is very overwhelmed and just has a lot of things she has forgotten prior to her stay at the hospital, shed like it if you'd call and give a very descriptive detail on what she is to do. She stated during her stay at the hospital they had to resuscitate her and due to her being resuscitated she's lost a lot of oxygen to the brain and is  having a very hard time remembering things. She also stated that at the hospital they punctured a hole in the leg and wants to know if the medication given to her will interfere with the wound to the left leg (Knee to foot length size) She has nurses that make round trips to her house and come check and dress her leg she just wants to be sure with the wound being down to her foot, what we do doesn't cause further infection. Please advise.    Please call her husband back at [phone]   She wants to stress that she does not have MyChart access and can't see anything.

## 2023-01-03 ENCOUNTER — Other Ambulatory Visit: Payer: Self-pay | Admitting: Internal Medicine

## 2023-01-03 ENCOUNTER — Telehealth: Payer: Self-pay | Admitting: Internal Medicine

## 2023-01-03 ENCOUNTER — Telehealth: Payer: Self-pay | Admitting: *Deleted

## 2023-01-03 ENCOUNTER — Other Ambulatory Visit: Payer: Self-pay

## 2023-01-03 DIAGNOSIS — I5032 Chronic diastolic (congestive) heart failure: Secondary | ICD-10-CM | POA: Diagnosis not present

## 2023-01-03 DIAGNOSIS — T8089XA Other complications following infusion, transfusion and therapeutic injection, initial encounter: Secondary | ICD-10-CM | POA: Diagnosis not present

## 2023-01-03 DIAGNOSIS — I428 Other cardiomyopathies: Secondary | ICD-10-CM

## 2023-01-03 DIAGNOSIS — I5023 Acute on chronic systolic (congestive) heart failure: Secondary | ICD-10-CM | POA: Diagnosis not present

## 2023-01-03 DIAGNOSIS — E1165 Type 2 diabetes mellitus with hyperglycemia: Secondary | ICD-10-CM

## 2023-01-03 DIAGNOSIS — I152 Hypertension secondary to endocrine disorders: Secondary | ICD-10-CM | POA: Diagnosis not present

## 2023-01-03 DIAGNOSIS — I96 Gangrene, not elsewhere classified: Secondary | ICD-10-CM | POA: Diagnosis not present

## 2023-01-03 DIAGNOSIS — T8189XA Other complications of procedures, not elsewhere classified, initial encounter: Secondary | ICD-10-CM | POA: Diagnosis not present

## 2023-01-03 NOTE — Telephone Encounter (Signed)
Received a call from [facility] with [facility] requesting pain medication (Oxycodone) for weekend for the patient.  She stated that the patient's PCP was out of the office.    Returned a call back to [place] with [facility] and informed her that we have not seen the patient in our office.  She has an appointment with Dr. Ladona Ridgel on (Monday-01/06/23).    Informed Synetta Fail with [facility] that I spoke with [facility], and he stated that she will need to reach out to the patient's PCP office.  She verbalized understanding and agreed.//AB/CMA

## 2023-01-03 NOTE — Telephone Encounter (Signed)
Refill has been sent to Dr. Yetta Barre

## 2023-01-03 NOTE — Telephone Encounter (Signed)
Caller & Relationship to patient: Self  Call back number: [phone]   Date of last office visit: 9.20.24  Date of next office visit: N/A  Medication(s) to be refilled:  Oxycodone HCl 10 MG TABS   Preferred Pharmacy:  [id number]   Phone: [phone]  Fax: [phone]    Pt states she was given the RX at the hospital but will run out this weekend.

## 2023-01-05 ENCOUNTER — Telehealth: Payer: Self-pay | Admitting: Podiatry

## 2023-01-05 NOTE — Telephone Encounter (Signed)
Called patient, as she requested, to go over her fungal nail culture results, as she called in stating she doesn't have access to MyChart.  Her husband noted she was sleeping and he had some questions as to "whether it was bad news".  Informed him we were just needing to review toenail fungal culture results, but that I need to speak to her directly.    If she calls back, someone from our office can review the earlier MyChart message with her and options of topical antifungal therapy to begin applying daily to the toenails for up to one year.    Dr. Burna Mortimer

## 2023-01-06 ENCOUNTER — Telehealth: Payer: Self-pay | Admitting: Internal Medicine

## 2023-01-06 ENCOUNTER — Other Ambulatory Visit: Payer: Self-pay | Admitting: Internal Medicine

## 2023-01-06 ENCOUNTER — Ambulatory Visit (INDEPENDENT_AMBULATORY_CARE_PROVIDER_SITE_OTHER): Payer: Medicare Other | Admitting: Plastic Surgery

## 2023-01-06 ENCOUNTER — Encounter (HOSPITAL_COMMUNITY): Payer: Medicare Other

## 2023-01-06 ENCOUNTER — Encounter (HOSPITAL_BASED_OUTPATIENT_CLINIC_OR_DEPARTMENT_OTHER): Payer: Medicare Other

## 2023-01-06 ENCOUNTER — Encounter: Payer: Self-pay | Admitting: Internal Medicine

## 2023-01-06 ENCOUNTER — Other Ambulatory Visit: Payer: Self-pay

## 2023-01-06 DIAGNOSIS — T8089XA Other complications following infusion, transfusion and therapeutic injection, initial encounter: Secondary | ICD-10-CM | POA: Diagnosis not present

## 2023-01-06 DIAGNOSIS — I152 Hypertension secondary to endocrine disorders: Secondary | ICD-10-CM | POA: Diagnosis not present

## 2023-01-06 DIAGNOSIS — I96 Gangrene, not elsewhere classified: Secondary | ICD-10-CM | POA: Diagnosis not present

## 2023-01-06 DIAGNOSIS — M47816 Spondylosis without myelopathy or radiculopathy, lumbar region: Secondary | ICD-10-CM

## 2023-01-06 DIAGNOSIS — T8189XA Other complications of procedures, not elsewhere classified, initial encounter: Secondary | ICD-10-CM | POA: Diagnosis not present

## 2023-01-06 DIAGNOSIS — Z515 Encounter for palliative care: Secondary | ICD-10-CM

## 2023-01-06 DIAGNOSIS — G8929 Other chronic pain: Secondary | ICD-10-CM

## 2023-01-06 DIAGNOSIS — I5032 Chronic diastolic (congestive) heart failure: Secondary | ICD-10-CM | POA: Diagnosis not present

## 2023-01-06 DIAGNOSIS — S81802D Unspecified open wound, left lower leg, subsequent encounter: Secondary | ICD-10-CM | POA: Diagnosis not present

## 2023-01-06 DIAGNOSIS — M170 Bilateral primary osteoarthritis of knee: Secondary | ICD-10-CM

## 2023-01-06 DIAGNOSIS — I5023 Acute on chronic systolic (congestive) heart failure: Secondary | ICD-10-CM | POA: Diagnosis not present

## 2023-01-06 MED ORDER — OXYCODONE HCL 10 MG PO TABS: 10.0000 mg | ORAL_TABLET | Freq: Four times a day (QID) | ORAL | 0 refills | Status: AC | PRN

## 2023-01-06 NOTE — Telephone Encounter (Signed)
Patient is aware that her medication has been refilled.

## 2023-01-06 NOTE — Telephone Encounter (Signed)
Patient called back and needs this medication

## 2023-01-06 NOTE — Telephone Encounter (Signed)
Is it okay for me to give the verbal orders?

## 2023-01-06 NOTE — Progress Notes (Signed)
[name] returns today for evaluation of her left lower extremity wound.  She states that she is still having some pain especially in the portion of the lower extremity beyond the wound.  She is still undergoing dressing changes with Xeroform and Santyl.  On examination the eschar has completely demarcated.  On palpation she has no sensation in the eschar but there is no area of fluctuance and no drainage that I can appreciate.  Given the patient's cardiac issues I am still very hesitant to return to the operating room if she does not absolutely have to go.  We will continue to treat the eschar as a biologic dressing.  As it heals under the eschar the eschar should begin to release and this will be trimmed in the office as appropriate.  Should she begin to worsen or there be evidence of infection we can revisit this plan of care.  She is likely to have discomfort in the wound for some time.  I have asked her to address chronic pain management with her primary care provider.  Follow-up with me in 7 to 10 days

## 2023-01-06 NOTE — Telephone Encounter (Signed)
Liji from [facility] called for verbal orders for this pt and would like for someone to give her a call back with those.  Pt is needing 1 PT session a week for 6 weeks to work on gate balance and strength.  [facility] [phone]- Direct Line PLEASE ADVISE.[name]

## 2023-01-06 NOTE — Telephone Encounter (Signed)
Patient called to check on the status of their request. They said they are in a lot of pain.

## 2023-01-07 ENCOUNTER — Encounter: Payer: Self-pay | Admitting: Psychology

## 2023-01-07 ENCOUNTER — Telehealth: Payer: Self-pay | Admitting: Internal Medicine

## 2023-01-07 NOTE — Telephone Encounter (Signed)
Is it okay for me to give the verbal orders?

## 2023-01-07 NOTE — Telephone Encounter (Signed)
Verbal orders has been given

## 2023-01-07 NOTE — Telephone Encounter (Signed)
Marcelino Duster calling from [employer] giving [facility] for Occupational Therapy 1 week for 5 weeks  Call back number is [phone]

## 2023-01-07 NOTE — Telephone Encounter (Signed)
Verbal Orders has been given.

## 2023-01-08 ENCOUNTER — Other Ambulatory Visit (HOSPITAL_COMMUNITY): Payer: Self-pay | Admitting: Physician Assistant

## 2023-01-08 ENCOUNTER — Ambulatory Visit (HOSPITAL_BASED_OUTPATIENT_CLINIC_OR_DEPARTMENT_OTHER): Payer: Medicare Other | Admitting: Physical Therapy

## 2023-01-08 DIAGNOSIS — I96 Gangrene, not elsewhere classified: Secondary | ICD-10-CM | POA: Diagnosis not present

## 2023-01-08 DIAGNOSIS — T8089XA Other complications following infusion, transfusion and therapeutic injection, initial encounter: Secondary | ICD-10-CM | POA: Diagnosis not present

## 2023-01-08 DIAGNOSIS — I5032 Chronic diastolic (congestive) heart failure: Secondary | ICD-10-CM | POA: Diagnosis not present

## 2023-01-08 DIAGNOSIS — T8189XA Other complications of procedures, not elsewhere classified, initial encounter: Secondary | ICD-10-CM | POA: Diagnosis not present

## 2023-01-08 DIAGNOSIS — I428 Other cardiomyopathies: Secondary | ICD-10-CM

## 2023-01-08 DIAGNOSIS — I5023 Acute on chronic systolic (congestive) heart failure: Secondary | ICD-10-CM | POA: Diagnosis not present

## 2023-01-08 DIAGNOSIS — I152 Hypertension secondary to endocrine disorders: Secondary | ICD-10-CM | POA: Diagnosis not present

## 2023-01-08 NOTE — Progress Notes (Signed)
Patient requiring EKG prior to psychotropic medication management.  Patient has a history of recent cardiac arrest occurring on 12/05/2022.

## 2023-01-09 ENCOUNTER — Ambulatory Visit: Payer: Medicare Other | Admitting: Gastroenterology

## 2023-01-09 ENCOUNTER — Other Ambulatory Visit (HOSPITAL_COMMUNITY): Payer: Medicare Other

## 2023-01-09 ENCOUNTER — Other Ambulatory Visit (HOSPITAL_COMMUNITY): Payer: Self-pay | Admitting: Physician Assistant

## 2023-01-09 DIAGNOSIS — I5023 Acute on chronic systolic (congestive) heart failure: Secondary | ICD-10-CM | POA: Diagnosis not present

## 2023-01-09 DIAGNOSIS — I96 Gangrene, not elsewhere classified: Secondary | ICD-10-CM | POA: Diagnosis not present

## 2023-01-09 DIAGNOSIS — T8189XA Other complications of procedures, not elsewhere classified, initial encounter: Secondary | ICD-10-CM | POA: Diagnosis not present

## 2023-01-09 DIAGNOSIS — I152 Hypertension secondary to endocrine disorders: Secondary | ICD-10-CM | POA: Diagnosis not present

## 2023-01-09 DIAGNOSIS — I5032 Chronic diastolic (congestive) heart failure: Secondary | ICD-10-CM | POA: Diagnosis not present

## 2023-01-09 DIAGNOSIS — I428 Other cardiomyopathies: Secondary | ICD-10-CM

## 2023-01-09 DIAGNOSIS — T8089XA Other complications following infusion, transfusion and therapeutic injection, initial encounter: Secondary | ICD-10-CM | POA: Diagnosis not present

## 2023-01-10 ENCOUNTER — Telehealth: Payer: Self-pay | Admitting: Cardiology

## 2023-01-10 ENCOUNTER — Other Ambulatory Visit (HOSPITAL_COMMUNITY): Payer: Self-pay

## 2023-01-10 ENCOUNTER — Other Ambulatory Visit: Payer: Self-pay

## 2023-01-10 DIAGNOSIS — T8089XA Other complications following infusion, transfusion and therapeutic injection, initial encounter: Secondary | ICD-10-CM | POA: Diagnosis not present

## 2023-01-10 DIAGNOSIS — T8189XA Other complications of procedures, not elsewhere classified, initial encounter: Secondary | ICD-10-CM | POA: Diagnosis not present

## 2023-01-10 DIAGNOSIS — I152 Hypertension secondary to endocrine disorders: Secondary | ICD-10-CM | POA: Diagnosis not present

## 2023-01-10 DIAGNOSIS — I5032 Chronic diastolic (congestive) heart failure: Secondary | ICD-10-CM | POA: Diagnosis not present

## 2023-01-10 DIAGNOSIS — I5023 Acute on chronic systolic (congestive) heart failure: Secondary | ICD-10-CM | POA: Diagnosis not present

## 2023-01-10 DIAGNOSIS — I96 Gangrene, not elsewhere classified: Secondary | ICD-10-CM | POA: Diagnosis not present

## 2023-01-10 NOTE — Telephone Encounter (Signed)
Pt is waiting for someone to call her to schedule an EKG Please advise

## 2023-01-10 NOTE — Telephone Encounter (Signed)
Spoke to patient who called concerning her EKG. She is asking if we knew who called her to schedule her EKG. Advised patient to call her psychiatrist office since they ordered it they can give her that information. Patient verbalized understanding and agree. No further questions at this time.

## 2023-01-10 NOTE — Telephone Encounter (Signed)
Left message to return call 

## 2023-01-13 ENCOUNTER — Other Ambulatory Visit: Payer: Self-pay

## 2023-01-13 ENCOUNTER — Ambulatory Visit (INDEPENDENT_AMBULATORY_CARE_PROVIDER_SITE_OTHER): Payer: Medicare Other | Admitting: Student

## 2023-01-13 DIAGNOSIS — S81802D Unspecified open wound, left lower leg, subsequent encounter: Secondary | ICD-10-CM

## 2023-01-13 DIAGNOSIS — I152 Hypertension secondary to endocrine disorders: Secondary | ICD-10-CM | POA: Diagnosis not present

## 2023-01-13 DIAGNOSIS — T8189XA Other complications of procedures, not elsewhere classified, initial encounter: Secondary | ICD-10-CM | POA: Diagnosis not present

## 2023-01-13 DIAGNOSIS — T8089XA Other complications following infusion, transfusion and therapeutic injection, initial encounter: Secondary | ICD-10-CM | POA: Diagnosis not present

## 2023-01-13 DIAGNOSIS — I5023 Acute on chronic systolic (congestive) heart failure: Secondary | ICD-10-CM | POA: Diagnosis not present

## 2023-01-13 DIAGNOSIS — I96 Gangrene, not elsewhere classified: Secondary | ICD-10-CM | POA: Diagnosis not present

## 2023-01-13 DIAGNOSIS — I5032 Chronic diastolic (congestive) heart failure: Secondary | ICD-10-CM | POA: Diagnosis not present

## 2023-01-13 MED ORDER — SANTYL 250 UNIT/GM EX OINT: 1.0000 | TOPICAL_OINTMENT | Freq: Every day | CUTANEOUS | 0 refills | Status: DC

## 2023-01-13 NOTE — Progress Notes (Signed)
Referring Provider [name], MD [address] [place],  [place] [address]   CC: No chief complaint on file.     [name] is an 57 y.o. female.  HPI: 57 year old female with a past medical history of postpartum cardiomyopathy, history of CHF with EF 30 to 35%, DM, morbid obesity who presented to [facility] ED on 12/05/2022 via EMS for cardiac arrest.  She was admitted to the hospital, initially in the ICU and intubated. She did receive intraosseous fluids with subsequent skin changes involving the left lower extremity and resulting left lower extremity wound and some skin necrosis.  Patient presents to our clinic today for further follow-up of her left lower extremity wound.  Patient was last seen in the clinic on 01/06/2023.  At this visit, patient was still undergoing dressing changes with Xeroform and Santyl.  On exam, the eschar had completely demarcated.  On palpation, there is no sensation in the eschar, but there was no area of fluctuance and no drainage that was appreciated.  It was discussed with the patient that given her cardiac issues we are going to try to avoid the operating room for now.  Plan was to continue dressing changes for now.  Today, patient presents with her husband at bedside.  Patient states that home health nursing has been coming and a few times a week and doing her dressing changes.  She states that she is still experiencing some pain to her left lower extremity.  She denies any fevers or chills.  Denies any other issues or complaints at this time.  Review of Systems General: Denies fevers or chills  Physical Exam    12/30/2022    5:07 AM 12/29/2022    8:01 PM 12/29/2022   12:42 PM  Vitals with BMI  Systolic 111 109 99  Diastolic 72 75 71  Pulse 87 89 88    General:  No acute distress,  Alert and oriented, Non-Toxic, Normal speech and affect On exam, patient is sitting upright in no acute distress.  To the left lower extremity, there is a very  large wound extending from the mid thigh to the mid shin.  Overlying the entirety of the wound is a black eschar.  It does appear somewhat dry today.  There is a little bit of mild erythema noted to the skin just inferior of the wound.  There is no warmth or increased tenderness to palpation in this area.  There is no active drainage on exam.  Assessment/Plan  Wound of left lower extremity, subsequent encounter   Discussed with patient that eschar should be covered and and Santyl, Vaseline, Xeroform, ABD pad, Kerlix and Ace wrap daily.  Discussed with her the importance of keeping the eschar moist.  Discussed with the husband that he will have to do the dressing changes on the days that home health care does not come in.  I wrote down the instructions for him.  He expressed understanding.  Also discussed with her that we will need the wound to declare itself once we see what is underneath the eschar once it starts to separate to see how she is healing underneath.  Patient expressed understanding.  I recommended that the patient follow-up with her primary care provider in regards to her pain.  Recommended that she call them today.  Patient expressed understanding.  Instructed the patient to call us if she has any worsening symptoms or has any concerns.  Will see the patient back in  1 to 2 weeks.  Pictures were obtained of the patient and placed in the chart with the patient's or guardian's permission.   [name] 01/13/2023, 9:19 AM

## 2023-01-14 ENCOUNTER — Telehealth: Payer: Self-pay

## 2023-01-14 ENCOUNTER — Ambulatory Visit (HOSPITAL_COMMUNITY)
Admit: 2023-01-14 | Discharge: 2023-01-14 | Disposition: A | Discharge status: Home / Self Care | Payer: Medicare Other | Attending: Adult Health | Admitting: Adult Health

## 2023-01-14 ENCOUNTER — Telehealth: Payer: Self-pay | Admitting: Internal Medicine

## 2023-01-14 ENCOUNTER — Encounter (HOSPITAL_BASED_OUTPATIENT_CLINIC_OR_DEPARTMENT_OTHER): Payer: Medicare Other

## 2023-01-14 ENCOUNTER — Other Ambulatory Visit: Payer: Self-pay | Admitting: Internal Medicine

## 2023-01-14 ENCOUNTER — Encounter (HOSPITAL_COMMUNITY): Payer: Self-pay

## 2023-01-14 VITALS — BP 130/82 | HR 92 | Wt 287.4 lb

## 2023-01-14 DIAGNOSIS — I428 Other cardiomyopathies: Secondary | ICD-10-CM | POA: Diagnosis not present

## 2023-01-14 DIAGNOSIS — F32A Depression, unspecified: Secondary | ICD-10-CM | POA: Insufficient documentation

## 2023-01-14 DIAGNOSIS — I130 Hypertensive heart and chronic kidney disease with heart failure and stage 1 through stage 4 chronic kidney disease, or unspecified chronic kidney disease: Secondary | ICD-10-CM | POA: Diagnosis not present

## 2023-01-14 DIAGNOSIS — I5022 Chronic systolic (congestive) heart failure: Secondary | ICD-10-CM

## 2023-01-14 DIAGNOSIS — F419 Anxiety disorder, unspecified: Secondary | ICD-10-CM | POA: Diagnosis not present

## 2023-01-14 DIAGNOSIS — I469 Cardiac arrest, cause unspecified: Secondary | ICD-10-CM | POA: Diagnosis not present

## 2023-01-14 DIAGNOSIS — G4733 Obstructive sleep apnea (adult) (pediatric): Secondary | ICD-10-CM | POA: Insufficient documentation

## 2023-01-14 DIAGNOSIS — E1165 Type 2 diabetes mellitus with hyperglycemia: Secondary | ICD-10-CM

## 2023-01-14 DIAGNOSIS — N189 Chronic kidney disease, unspecified: Secondary | ICD-10-CM | POA: Diagnosis not present

## 2023-01-14 DIAGNOSIS — I5040 Unspecified combined systolic (congestive) and diastolic (congestive) heart failure: Secondary | ICD-10-CM | POA: Insufficient documentation

## 2023-01-14 DIAGNOSIS — R42 Dizziness and giddiness: Secondary | ICD-10-CM | POA: Insufficient documentation

## 2023-01-14 DIAGNOSIS — E1122 Type 2 diabetes mellitus with diabetic chronic kidney disease: Secondary | ICD-10-CM | POA: Diagnosis not present

## 2023-01-14 DIAGNOSIS — F1010 Alcohol abuse, uncomplicated: Secondary | ICD-10-CM | POA: Insufficient documentation

## 2023-01-14 DIAGNOSIS — Z79899 Other long term (current) drug therapy: Secondary | ICD-10-CM | POA: Diagnosis not present

## 2023-01-14 DIAGNOSIS — E1159 Type 2 diabetes mellitus with other circulatory complications: Secondary | ICD-10-CM | POA: Insufficient documentation

## 2023-01-14 LAB — BASIC METABOLIC PANEL
Anion gap: 12 (ref 5–15)
BUN: 23 mg/dL — ABNORMAL HIGH (ref 6–20)
CO2: 24 mmol/L (ref 22–32)
Calcium: 9.5 mg/dL (ref 8.9–10.3)
Chloride: 102 mmol/L (ref 98–111)
Creatinine, Ser: 1.04 mg/dL — ABNORMAL HIGH (ref 0.44–1.00)
GFR, Estimated: >60 mL/min (ref >60)
Glucose, Bld: 118 mg/dL — ABNORMAL HIGH (ref 70–99)
Potassium: 4.2 mmol/L (ref 3.5–5.1)
Sodium: 138 mmol/L (ref 135–145)

## 2023-01-14 MED ORDER — METFORMIN HCL ER 500 MG PO TB24: 500.0000 mg | ORAL_TABLET | Freq: Two times a day (BID) | ORAL | 0 refills | Status: DC

## 2023-01-14 NOTE — Telephone Encounter (Signed)
Husband calleds back. He wants to know can you changed the strength of the metformin to 1000 MG since that's what she's taking.

## 2023-01-14 NOTE — Progress Notes (Unsigned)
Electrophysiology Office Note:   Date:  01/15/2023  ID:  [name], DOB Feb 12, 1966, MRN [id number]  Primary Cardiologist: [name], MD Electrophysiologist: None      History of Present Illness:   [name] is a 57 y.o. female with h/o HTN, VF, NICM, OSA, CKD, DMII, Anxiety, depression, HFrEF, and recent cardiac arrest seen today for post hospital follow up.    Admitted 12/05/22 after witnessed cardiac arrest.Received VF on rhythm, shock x6, CPR/ACLS x 40 minutes with ROSC. Started on Amio drip. Had cath with normal coronaries and preserved cardiac output. CMRI EF 31% and normal RV. EP consulted. Had complex lower extremity wound after Intraosseeous IV infiltrated. Developed full thickness wound. Plastic surgery consulted. ICD not placed due to lower extremity wound. Discharged to CIR 12/17/22 and later to home on 12/30/22.    She has been seen by Plastic Surgery in follow up. Topical wound care was continued. Followed on weekly basis.  Seen by HF TOC clinic 11/18. Expressed concern on seeing multiple providers.   Since discharge from hospital the patient reports doing OK. She is frustrated with multiple caregivers and feels like she is getting conflicting information. Continues to have pain and discomfort from her wound. Has some mild lightheadedness, sleeping on 2 pillows.  Wearing Lifevest, is getting some loss of contact alarms.  Review of systems complete and found to be negative unless listed in HPI.   EP Information / Studies Reviewed:    EKG is not ordered today. EKG from 01/14/2023 reviewed which showed NSR at 97 bpm        cMRI 12/12/2022 1. Normal LV size, no hypertrophy, and moderate systolic dysfunction (EF 31%) 2.  Normal RV size and systolic function (EF 50%) 3. RV insertion site late gadolinium enhancement, which is a nonspecific scar pattern often seen in setting of elevated pulmonary pressures   12/09/22: R/LHC Right & Left Heart Catheterization  12/09/22: Hemodynamic data: LV: 119/7, EDP 17 mmHg.  Ao 110/78, mean 92 mmHg.  No pressure gradient across the aortic valve. RA: 15/13, mean 8 mmHg. RV 47/6, EDP 17 mmHg. PA 46/21, mean 31 mmHg.  PA saturation 66%. PW 18/19, mean 19 mmHg.  AO saturation 92%. CO 6.69, CI 2.83, preserved and normal.  QP/QS 1.00.   Angiographic data: RCA: Dominant, has anterior origin, small to normal. LM: Has superior takeoff, a 5 [place] 3.0 guide catheter was utilized to engage.  Normal left main. LAD: Large-caliber vessel giving origin to 2 large diagonals, smooth and normal. RI: Moderate caliber vessel, smooth and normal. LCx: Large vessel giving origin to large OM1 which has secondary branches and continues in the AV groove is a moderate-sized vessel and gives origin to OM 2 from the distal end.  Smooth and normal.   01/07/23: TTE LVEF 30-35%, global HK, mild LVH, normal RV, trivial MR   07/10/21: TTE LVEF 30-35%, grade 1 DD, normal RV, trivial MR   12/30/18 EF 50-55%.   Physical Exam:   VS:  BP 108/70   Pulse 93   Ht 5\' 4"  (1.626 m)   Wt 287 lb (130.2 kg)   SpO2 93%   BMI 49.26 kg/m    Wt Readings from Last 3 Encounters:  01/15/23 287 lb (130.2 kg)  01/14/23 287 lb 6.4 oz (130.4 kg)  12/28/22 (!) 336 lb (152.4 kg)     GEN: Well nourished, well developed in no acute distress NECK: No JVD; No carotid bruits CARDIAC: Regular rate and rhythm, no murmurs, rubs,  gallops RESPIRATORY:  Clear to auscultation without rales, wheezing or rhonchi  ABDOMEN: Soft, non-tender, non-distended EXTREMITIES:  No edema; No deformity   ASSESSMENT AND PLAN:    NICM OOH arrest / VF Chronic systolic CHF GDMT limited by soft pressures and lightheadedness.  Continue Toprol, losarta, spiro, and farxiga. The risks of a transvenous device outweigh the benefits at this juncture, with significant risk or infection given ongoing leg wound.   Chronic [name] Followed by plastic surgery.  Planning Topical  enzymatic debridement; with no plans for surgery. Per note 11/18 "we will need the wound to declare itself once we see what is underneath the eschar once it starts to separate to see how she is healing underneath"  Anxiety Appreciated CSW input.   OSA  Encouraged nightly CPAP   Follow up with Dr. [name] in 3 months, unless wound is definitively managed sooner. Discussed with Dr. [name] who agrees.   Signed, Graciella Freer, PA-C

## 2023-01-14 NOTE — Progress Notes (Addendum)
HEART & VASCULAR TRANSITION OF CARE CONSULT NOTE     Referring Physician: Dr Nadara Eaton  Primary Care: Dr Yetta Barre  Primary Cardiologist: Dr [name]   HPI: Referred to clinic by Dr Nadara Eaton for heart failure consultation.   [name] is a 9 year with history of HTN, VF, NICM, OSA, CKD, DMII, anxiety, depression, HFrEF, and recent cardiac arrest.   Admitted 12/05/22 after witnessed cardiac arrest.Received VF on rhythm, shock x6, CPR/ACLS x 40 minutes with ROSC. Started on Amio drip. Had cath with normal coronaries and preserved cardiac output. CMRI EF 31% and normal RV. EP consulted. Had complex lower extremity wound after Intraosseeous IV infiltrated. Developed full thickness wound. Plastic surgery consulted. ICD not placed due to lower extremity wound. Discharged to CIR 12/17/22 and later to home on 12/30/22.   She has been seen by Plastic Surgery in follow up. Topical wound care was continued. Followed on weekly basis.   She presents today with her husband. Frustrated over multiple medical providers. Says she has been getting different information from providers and wants providers to get together and have a plan. Complaining of pain [name].Marland KitchenDenies SOB/PND/Orthopnea. Occasionally lightheaded. Sleeps on 2 pillows. Can't use CPAP because she feels congested.  Wearing Life Vest. Appetite ok. No fever or chills. Taking all medications. Taking oxycode a few times a day. Followed by home health for wound care/OT/PT.     Cardiac Testing  CMRI 12/12/22  1. Normal LV size, no hypertrophy, and moderate systolic dysfunction (EF 31%)  2.  Normal RV size and systolic function (EF 50%)  3. RV insertion site late gadolinium enhancement, which is a nonspecific scar pattern often seen in setting of elevated pulmonary Pressures  Cath 12/09/22  LV: 119/7, EDP 17 mmHg.  Ao 110/78, mean 92 mmHg.  No pressure gradient across the aortic valve. RA: 15/13, mean 8 mmHg. RV 47/6, EDP 17 mmHg. PA 46/21, mean 31 mmHg.   PA saturation 66%. PW 18/19, mean 19 mmHg.  AO saturation 92%. CO 6.69, CI 2.83, preserved and normal.  QP/QS 1.00.  Echo 12/07/22 1. Left ventricular ejection fraction, by estimation, is 30 to 35%. The  left ventricle has moderately decreased function. The left ventricle  demonstrates global hypokinesis. There is mild left ventricular  hypertrophy. Indeterminate diastolic filling due   to E-A fusion.   2. Right ventricular systolic function is normal. The right ventricular  size is normal.   3. The mitral valve is normal in structure. Trivial mitral valve  regurgitation.   4. The aortic valve is tricuspid. Aortic valve regurgitation is not  visualized.   Echo 2023 Ef 30-35%  Echo EF 2020 50-55%    Past Medical History:  Diagnosis Date   ADD (attention deficit disorder)    Anxiety    Arthritis    both knees right worse than left   Carpal tunnel syndrome of right wrist    Depression    Essential hypertension, benign    Gallstones    History of smoking 06/22/2016   Hyperlipidemia associated with type 2 diabetes mellitus (HCC), on Zocor 04/04/2011   Hypertension associated with diabetes (HCC) 06/03/2008   Migraines    Morbid obesity (HCC)    Morbid obesity with BMI of 50.0-59.9, adult (HCC) 07/12/2006   NICM (nonischemic cardiomyopathy) (HCC) 07/12/2006   01/16/18 ECHO:    - Procedure narrative: Transthoracic echocardiography. Image   quality was suboptimal. The study was technically difficult.   Intravenous contrast (Definity) was administered. - Left ventricle: The  cavity size was moderately dilated. Wall   thickness was increased in a pattern of mild LVH. Systolic   function was moderately to severely reduced. The estimated   ejection fraction w   Normal coronary arteries 04/17/2016   OSA on CPAP 10/28/2014   Spinal stenosis    back pain   Spondylosis without myelopathy or radiculopathy, lumbar region 12/04/2017   Type 2 diabetes mellitus with hyperglycemia (HCC)     Current  Outpatient Medications  Medication Sig Dispense Refill   acetaminophen (TYLENOL) 325 MG tablet Take 1-2 tablets (325-650 mg total) by mouth every 4 (four) hours as needed for mild pain (pain score 1-3). 100 tablet 0   ALPRAZolam (XANAX) 0.5 MG tablet Take 1 tablet (0.5 mg total) by mouth 2 (two) times daily as needed for anxiety (if anxiety unrelieved by hydroxyzine). 30 tablet 0   ascorbic acid (VITAMIN C) 500 MG tablet Take 1 tablet (500 mg total) by mouth daily. 30 tablet 0   atorvastatin (LIPITOR) 80 MG tablet Take 1 tablet (80 mg total) by mouth daily. 30 tablet 0   Blood Glucose Monitoring Suppl DEVI 1 each by Does not apply route in the morning, at noon, and at bedtime. May substitute to any manufacturer covered by patient's insurance. 1 each 0   cephALEXin (KEFLEX) 500 MG capsule Take 1 capsule (500 mg total) by mouth every 8 (eight) hours. 21 capsule 0   collagenase (SANTYL) 250 UNIT/GM ointment Apply 1 gram topically 2 (two) times daily to area on knee, cover with calcium alginate, 4 X 4 and kerlix 60 g 0   collagenase (SANTYL) 250 UNIT/GM ointment Apply 1 Application topically daily. 15 g 0   dapagliflozin propanediol (FARXIGA) 10 MG TABS tablet Take 1 tablet (10 mg total) by mouth daily. 30 tablet 0   diclofenac Sodium (VOLTAREN) 1 % GEL Apply 2 g topically 4 (four) times daily. To jaw for TMJ symptoms 350 g 0   fluticasone (FLONASE) 50 MCG/ACT nasal spray Place 2 sprays into both nostrils at bedtime. 16 g 0   furosemide (LASIX) 40 MG tablet Take 1 tablet (40 mg total) by mouth daily. 30 tablet 0   gabapentin (NEURONTIN) 300 MG capsule Take 1 capsule (300 mg total) by mouth 3 (three) times daily. 90 capsule 0   Glucose Blood (BLOOD GLUCOSE TEST STRIPS) STRP 1 each by In Vitro route in the morning, at noon, and at bedtime. May substitute to any manufacturer covered by patient's insurance. 100 strip 11   lidocaine (LIDODERM) 5 % Place 2 patches onto the skin daily. Remove & Discard patch  within 12 hours or as directed by MD 60 patch 0   losartan (COZAAR) 25 MG tablet Take 0.5 tablets (12.5 mg total) by mouth daily. 30 tablet 0   magnesium oxide (MAG-OX) 400 MG tablet Take 1 tablet (400 mg total) by mouth at bedtime. 30 tablet 0   methylphenidate (RITALIN) 5 MG tablet Take 1 tablet (5 mg total) by mouth 2 (two) times daily with breakfast and lunch. 60 tablet 0   metoprolol succinate (TOPROL-XL) 50 MG 24 hr tablet Take 1 tablet (50 mg total) by mouth daily. Take with or immediately following a meal. 30 tablet 0   Multiple Vitamin (MULTIVITAMIN WITH MINERALS) TABS tablet Take 1 tablet by mouth daily. 30 tablet 0   naloxone (NARCAN) nasal spray 4 mg/0.1 mL Use in case of overdose 2 each 1   NON FORMULARY Pt uses cpap nightly  nutrition supplement, JUVEN, (JUVEN) PACK Take 1 packet by mouth 2 (two) times daily between meals. 60 packet 0   Oxycodone HCl 10 MG TABS Take 1 tablet (10 mg total) by mouth every 6 (six) hours as needed for up to 9 days. 35 tablet 0   polyethylene glycol powder (GLYCOLAX/MIRALAX) 17 GM/SCOOP powder Take 1 capful (17 g) in water 2 (two) times daily. 238 g 0   potassium chloride SA (KLOR-CON M) 20 MEQ tablet Take 1 tablet (20 mEq total) by mouth daily. 30 tablet 0   senna-docusate (SENOKOT-S) 8.6-50 MG tablet Take 2 tablets by mouth at bedtime. 60 tablet 0   spironolactone (ALDACTONE) 25 MG tablet Take 0.5 tablets (12.5 mg total) by mouth daily. 30 tablet 0   topiramate (TOPAMAX) 25 MG tablet Take 1 tablet (25 mg total) by mouth at bedtime. 30 tablet 0   metFORMIN (GLUCOPHAGE-XR) 500 MG 24 hr tablet Take 1 tablet (500 mg total) by mouth 2 (two) times daily with a meal. 60 tablet 0   No current facility-administered medications for this encounter.    Allergies  Allergen Reactions   Fluoxetine Other (See Comments)    More depressed   Cymbalta [Duloxetine Hcl] Other (See Comments)    depressed   Pork-Derived Products    Penicillins Rash    Has patient  had a PCN reaction causing immediate rash, facial/tongue/throat swelling, SOB or lightheadedness with hypotension: YES Has patient had a PCN reaction causing severe rash involving mucus membranes or skin necrosis: NO Has patient had a PCN reaction that required hospitalization: YES Has patient had a PCN reaction occurring within the last 10 years: NO If all of the above answers are "NO", then may proceed with Cephalosporin use.       Social History   Socioeconomic History   Marital status: Married    Spouse name: [name]   Number of children: 1   Years of education: Not on file   Highest education level: Associate degree: academic program  Occupational History   Not on file  Tobacco Use   Smoking status: Former    Current packs/day: 0.00    Types: Cigarettes    Quit date: 02/25/1997    Years since quitting: 25.9   Smokeless tobacco: Never   Tobacco comments:    Married, lives with spouse (when he is not traveling) and son  Vaping Use   Vaping status: Never Used  Substance and Sexual Activity   Alcohol use: No   Drug use: No   Sexual activity: Not Currently    Partners: Male    Birth control/protection: None  Other Topics Concern   Not on file  Social History Narrative   Not on file   Social Determinants of Health   Financial Resource Strain: Low Risk  (12/11/2022)   Overall Financial Resource Strain (CARDIA)    Difficulty of Paying Living Expenses: Not hard at all  Food Insecurity: No Food Insecurity (12/06/2022)   Hunger Vital Sign    Worried About Running Out of Food in the Last Year: Never true    Ran Out of Food in the Last Year: Never true  Transportation Needs: No Transportation Needs (12/11/2022)   PRAPARE - Administrator, Civil Service (Medical): No    Lack of Transportation (Non-Medical): No  Physical Activity: Inactive (11/12/2022)   Exercise Vital Sign    Days of Exercise per Week: 0 days    Minutes of Exercise per Session: 0 min  Stress:  Stress Concern Present (11/12/2022)   [employer] of Occupational Health - Occupational Stress Questionnaire    Feeling of Stress : Very much  Social Connections: Socially Isolated (08/08/2021)   Social Connection and Isolation Panel [NHANES]    Frequency of Communication with Friends and Family: Once a week    Frequency of Social Gatherings with Friends and Family: Once a week    Attends Religious Services: Never    Database administrator or Organizations: No    Attends Banker Meetings: Never    Marital Status: Married  Catering manager Violence: Not At Risk (08/08/2021)   Humiliation, Afraid, Rape, and Kick questionnaire    Fear of Current or Ex-Partner: No    Emotionally Abused: No    Physically Abused: No    Sexually Abused: No      Family History  Problem Relation Age of Onset   Depression Mother    Anxiety disorder Mother    Diabetes Mother    Hypertension Mother    ADD / ADHD Father    Diabetes Father    Hypertension Father    Colitis Father    Hypertension Other    Diabetes Other    Colitis Other    Alcohol abuse Other     Vitals:   01/14/23 1415  BP: 130/82  Pulse: 92  SpO2: 95%  Weight: 130.4 kg (287 lb 6.4 oz)   Wt Readings from Last 3 Encounters:  01/14/23 130.4 kg (287 lb 6.4 oz)  12/28/22 (!) 152.4 kg (336 lb)  12/17/22 133.5 kg (294 lb 5 oz)    PHYSICAL EXAM: General:  Arrived in the wheelchair.  No respiratory difficulty. Husband present.  HEENT: normal Neck: supple. no JVD. Carotids 2+ bilat; no bruits. No lymphadenopathy or thryomegaly appreciated. Cor: PMI nondisplaced. Regular rate & rhythm. No rubs, gallops or murmurs. Wearing Life Vest.  Lungs: clear Abdomen: obese, soft, nontender, nondistended. No hepatosplenomegaly. No bruits or masses. Good bowel sounds. Extremities: no cyanosis, clubbing, rash, edema. [name] dressing Neuro: alert & oriented x 3, cranial nerves grossly intact. moves all 4 extremities w/o difficulty.  Affect pleasant.  ECG: SR 97 bpm QRS  98 Samantha QT/QT-c 370/469    ASSESSMENT & PLAN: 1. Chronic HFrEF, NICM   Echo 2023 EF 30-35%. Had witnessed cardiac arrest 12/05/22. ECHO 11/2022 EF 30-35% normal RV. Cath no coronary disease. CMRI EF 31%.  Continue Life Vest for now.  NYHA II GDMT - No med titration with occasional lightheadedness.  Diuretic- Volume status stable. Continue lasix 40 mg daily BB- Toprol XL 50 mg daily  Ace/ARB/ARNI- Continue losartan 12.5 mg daily  MRA- Continue spiro 12.5 mg daily.  SGLT2i- continue farxiga 10 mg dialy Check BMET   - Will need ECHO 3-4 months after HF meds optimized.   2. Cardiac Arrest, VF Wearing Life Vest. ICD deferred due to [name] wound.  Has follow up with EP.  I personally called EP, Otilio Saber PA to discuss todays visit.   3. Chronic [name] Followed by Plastic Surgery with topical enzymatic debridement.  No plan for surgery at this time. Has close follow up.   4. DMII Hgb A1C 6.6 11/2022  Previously on combination med. Now on farxiga + metformin.  Needs follow up with PCP. We will schedule.   5. Anxiety  Referred to SW to assist with coping strategies.   6. OSA Needs to use CPAP nightly.    Referred to HFSW (PCP, Medications, Transportation, ETOH Abuse, Drug Abuse, Insurance,  Financial ): Yes- Coping strategies Refer to Pharmacy:  No Refer to Home Health: Followed by [facility] .  Refer to Advanced [facility] Failure Clinic: No Refer to General Cardiology: Back to Dr [name] per patient request. She has a 20 year history with Dr [name]. We made an appointment with him next week given complexity of her condition.   Follow up as needed.   Samantha Kinnard NP-C  3:21 PM

## 2023-01-14 NOTE — Telephone Encounter (Signed)
Recived call from Heart Failure team as patient needs sooner appointment for diabetic medication management. Patient scheduled for 12/18 with PCP but needs sooner appointment for diabetic medication management.  Patient scheduled for 11/20 @ 220 p for hospital f/u.

## 2023-01-14 NOTE — Telephone Encounter (Signed)
Patient was prescribed a new medication by another doctor. They wanted to make sure of what they can/cannot take with the new medication. Best callback is [phone].

## 2023-01-14 NOTE — Patient Instructions (Addendum)
Medication Changes:  No Changes In Medications at this time.   Lab Work:  Labs done today, your results will be available in MyChart, we will contact you for abnormal readings.  Follow-Up in: WITH DR. HOCHREIN ON 11/27 AT 430 at [facility] OFFICE   TOMORROW WITH PCP DR. Yetta Barre AT 2:20PM   At the [facility], you and your health needs are our priority. We have a designated team specialized in the treatment of Heart Failure. This Care Team includes your primary Heart Failure Specialized Cardiologist (physician), Advanced Practice Providers (APPs- Physician Assistants and Nurse Practitioners), and Pharmacist who all work together to provide you with the care you need, when you need it.   You may see any of the following providers on your designated Care Team at your next follow up:  Dr. Arvilla Meres Dr. Marca Ancona Dr. Dorthula Nettles Dr. Theresia Bough Tonye Becket, NP Robbie Lis, [place] [facility] [place], [place] Brynda Peon, NP [place] Lee, NP Karle Plumber, PharmD   Please be sure to bring in all your medications bottles to every appointment.   Need to Contact us:  If you have any questions or concerns before your next appointment please send [place] a message through [place] or call our office at [phone].    TO LEAVE A MESSAGE FOR THE NURSE SELECT OPTION 2, PLEASE LEAVE A MESSAGE INCLUDING: YOUR NAME DATE OF BIRTH CALL BACK NUMBER REASON FOR CALL**this is important as we prioritize the call backs  YOU WILL RECEIVE A CALL BACK THE SAME DAY AS LONG AS YOU CALL BEFORE 4:00 PM

## 2023-01-14 NOTE — Telephone Encounter (Signed)
Noted  

## 2023-01-14 NOTE — Addendum Note (Signed)
Encounter addended by: Marcy Siren, LCSW on: 01/14/2023 4:18 PM  Actions taken: Clinical Note Signed

## 2023-01-14 NOTE — Addendum Note (Signed)
Encounter addended by: Baird Cancer, RN on: 01/14/2023 3:57 PM  Actions taken: Clinical Note Signed

## 2023-01-14 NOTE — Telephone Encounter (Signed)
Patients husband states that the patient was in the hospital and the prescribed her Metformin and she used to take Xigduo XR 1000 Mg and wants to know if she should continue taking the metformin.

## 2023-01-14 NOTE — Progress Notes (Signed)
CSW met with patient in the clinic to provide supporting counseling. Patient shared long history of depression, anxiety and ADD. She shared recent cardiac arrest and feels "like I should be more grateful" but I am just feeling overwhelmed. Patient states she has upcoming appointment with psych but frustrated that her original appointment was cancelled. She has multiple appointments for various medical needs and repeatedly stated she feels overwhelmed. She denies any suicidal ideation but noted she has had times in the past where she was treated for SI. CSW discussed coping mechanisms and strategies to manage her current health needs. Patient was very tearful but by the end of the appointment appeared to be improved and shared that she was feeling a bit relieved. CSW provided supportive intervention and available if needed. Lasandra Beech, LCSW, CCSW-MCS [phone]

## 2023-01-15 ENCOUNTER — Encounter: Payer: Self-pay | Admitting: Student

## 2023-01-15 ENCOUNTER — Ambulatory Visit: Payer: Medicare Other | Attending: Student | Admitting: Student

## 2023-01-15 ENCOUNTER — Other Ambulatory Visit: Payer: Self-pay | Admitting: Internal Medicine

## 2023-01-15 ENCOUNTER — Encounter: Payer: Self-pay | Admitting: Internal Medicine

## 2023-01-15 ENCOUNTER — Ambulatory Visit (INDEPENDENT_AMBULATORY_CARE_PROVIDER_SITE_OTHER): Payer: Medicare Other | Admitting: Internal Medicine

## 2023-01-15 VITALS — BP 126/86 | HR 90 | Temp 98.1°F | Ht 64.0 in | Wt 286.6 lb

## 2023-01-15 VITALS — BP 108/70 | HR 93 | Ht 64.0 in | Wt 287.0 lb

## 2023-01-15 DIAGNOSIS — I5022 Chronic systolic (congestive) heart failure: Secondary | ICD-10-CM | POA: Insufficient documentation

## 2023-01-15 DIAGNOSIS — I428 Other cardiomyopathies: Secondary | ICD-10-CM | POA: Diagnosis not present

## 2023-01-15 DIAGNOSIS — Z23 Encounter for immunization: Secondary | ICD-10-CM | POA: Diagnosis not present

## 2023-01-15 DIAGNOSIS — K5904 Chronic idiopathic constipation: Secondary | ICD-10-CM

## 2023-01-15 DIAGNOSIS — E1165 Type 2 diabetes mellitus with hyperglycemia: Secondary | ICD-10-CM

## 2023-01-15 DIAGNOSIS — I469 Cardiac arrest, cause unspecified: Secondary | ICD-10-CM | POA: Insufficient documentation

## 2023-01-15 DIAGNOSIS — G4733 Obstructive sleep apnea (adult) (pediatric): Secondary | ICD-10-CM | POA: Diagnosis not present

## 2023-01-15 DIAGNOSIS — D539 Nutritional anemia, unspecified: Secondary | ICD-10-CM | POA: Diagnosis not present

## 2023-01-15 DIAGNOSIS — I4901 Ventricular fibrillation: Secondary | ICD-10-CM | POA: Diagnosis not present

## 2023-01-15 DIAGNOSIS — R0981 Nasal congestion: Secondary | ICD-10-CM | POA: Diagnosis not present

## 2023-01-15 DIAGNOSIS — F332 Major depressive disorder, recurrent severe without psychotic features: Secondary | ICD-10-CM

## 2023-01-15 LAB — CBC WITH DIFFERENTIAL/PLATELET
Basophils Absolute: 0.1 10*3/uL (ref 0.0–0.1)
Basophils Relative: 0.6 % (ref 0.0–3.0)
Eosinophils Absolute: 0.2 10*3/uL (ref 0.0–0.7)
Eosinophils Relative: 1.5 % (ref 0.0–5.0)
HCT: 42.2 % (ref 36.0–46.0)
Hemoglobin: 13.8 g/dL (ref 12.0–15.0)
Lymphocytes Relative: 13 % (ref 12.0–46.0)
Lymphs Abs: 1.6 10*3/uL (ref 0.7–4.0)
MCHC: 32.7 g/dL (ref 30.0–36.0)
MCV: 87 fL (ref 78.0–100.0)
Monocytes Absolute: 0.9 10*3/uL (ref 0.1–1.0)
Monocytes Relative: 7 % (ref 3.0–12.0)
Neutro Abs: 9.7 10*3/uL — ABNORMAL HIGH (ref 1.4–7.7)
Neutrophils Relative %: 77.9 % — ABNORMAL HIGH (ref 43.0–77.0)
Platelets: 423 10*3/uL — ABNORMAL HIGH (ref 150.0–400.0)
RBC: 4.85 Mil/uL (ref 3.87–5.11)
RDW: 15.1 % (ref 11.5–15.5)
WBC: 12.4 10*3/uL — ABNORMAL HIGH (ref 4.0–10.5)

## 2023-01-15 LAB — IBC + FERRITIN
Ferritin: 87.1 ng/mL (ref 10.0–291.0)
Iron: 54 ug/dL (ref 42–145)
Saturation Ratios: 16.5 % — ABNORMAL LOW (ref 20.0–50.0)
TIBC: 327.6 ug/dL (ref 250.0–450.0)
Transferrin: 234 mg/dL (ref 212.0–360.0)

## 2023-01-15 LAB — VITAMIN B12: Vitamin B-12: 357 pg/mL (ref 211–911)

## 2023-01-15 LAB — TSH: TSH: 4.29 u[IU]/mL (ref 0.35–5.50)

## 2023-01-15 LAB — FOLATE: Folate: 19 ng/mL (ref >5.9)

## 2023-01-15 LAB — MAGNESIUM: Magnesium: 2.1 mg/dL (ref 1.5–2.5)

## 2023-01-15 MED ORDER — DEXCOM G7 RECEIVER DEVI: 1.0000 | Freq: Every day | 1 refills | Status: DC

## 2023-01-15 MED ORDER — DEXCOM G7 SENSOR MISC: 1.0000 | Freq: Every day | 1 refills | Status: DC

## 2023-01-15 NOTE — Progress Notes (Signed)
Subjective:  Patient ID: [name], female    DOB: October 07, 1965  Age: 57 y.o. MRN: [id number]  CC: Anemia, Congestive Heart Failure, and Diabetes   HPI [name] presents for f/up ----  Discussed the use of AI scribe software for clinical note transcription with the patient, who gave verbal consent to proceed.  History of Present Illness   The patient, with an unspecified cardiac history, experienced a sudden cardiac arrest while conversing with a neighbor. There were no preceding symptoms or warning signs on the day of the event. The patient's spouse initiated CPR and emergency services were contacted. The patient received approximately 45 chest compressions during resuscitation. Following successful resuscitation, an intravenous line was placed in the patient's leg, which subsequently developed complications. The patient was admitted to the ICU. Three days post-admission, the patient noted a significant deterioration in the condition of her leg, describing it as looking and feeling 'awful.'        Outpatient Medications Prior to Visit  Medication Sig Dispense Refill   acetaminophen (TYLENOL) 325 MG tablet Take 1-2 tablets (325-650 mg total) by mouth every 4 (four) hours as needed for mild pain (pain score 1-3). 100 tablet 0   ALPRAZolam (XANAX) 0.5 MG tablet Take 1 tablet (0.5 mg total) by mouth 2 (two) times daily as needed for anxiety (if anxiety unrelieved by hydroxyzine). 30 tablet 0   ascorbic acid (VITAMIN C) 500 MG tablet Take 1 tablet (500 mg total) by mouth daily. 30 tablet 0   atorvastatin (LIPITOR) 80 MG tablet Take 1 tablet (80 mg total) by mouth daily. 30 tablet 0   Blood Glucose Monitoring Suppl DEVI 1 each by Does not apply route in the morning, at noon, and at bedtime. May substitute to any manufacturer covered by patient's insurance. 1 each 0   cephALEXin (KEFLEX) 500 MG capsule Take 1 capsule (500 mg total) by mouth every 8 (eight) hours. 21 capsule 0   collagenase  (SANTYL) 250 UNIT/GM ointment Apply 1 gram topically 2 (two) times daily to area on knee, cover with calcium alginate, 4 X 4 and kerlix 60 g 0   collagenase (SANTYL) 250 UNIT/GM ointment Apply 1 Application topically daily. 15 g 0   dapagliflozin propanediol (FARXIGA) 10 MG TABS tablet Take 1 tablet (10 mg total) by mouth daily. 30 tablet 0   diclofenac Sodium (VOLTAREN) 1 % GEL Apply 2 g topically 4 (four) times daily. To jaw for TMJ symptoms 350 g 0   furosemide (LASIX) 40 MG tablet Take 1 tablet (40 mg total) by mouth daily. 30 tablet 0   gabapentin (NEURONTIN) 300 MG capsule Take 1 capsule (300 mg total) by mouth 3 (three) times daily. 90 capsule 0   Glucose Blood (BLOOD GLUCOSE TEST STRIPS) STRP 1 each by In Vitro route in the morning, at noon, and at bedtime. May substitute to any manufacturer covered by patient's insurance. 100 strip 11   lidocaine (LIDODERM) 5 % Place 2 patches onto the skin daily. Remove & Discard patch within 12 hours or as directed by MD 60 patch 0   losartan (COZAAR) 25 MG tablet Take 0.5 tablets (12.5 mg total) by mouth daily. 30 tablet 0   magnesium oxide (MAG-OX) 400 MG tablet Take 1 tablet (400 mg total) by mouth at bedtime. 30 tablet 0   metFORMIN (GLUCOPHAGE-XR) 500 MG 24 hr tablet Take 1 tablet (500 mg total) by mouth 2 (two) times daily with a meal. 60 tablet 0   methylphenidate (RITALIN)  5 MG tablet Take 1 tablet (5 mg total) by mouth 2 (two) times daily with breakfast and lunch. 60 tablet 0   metoprolol succinate (TOPROL-XL) 50 MG 24 hr tablet Take 1 tablet (50 mg total) by mouth daily. Take with or immediately following a meal. 30 tablet 0   Multiple Vitamin (MULTIVITAMIN WITH MINERALS) TABS tablet Take 1 tablet by mouth daily. 30 tablet 0   NON FORMULARY Pt uses cpap nightly     nutrition supplement, JUVEN, (JUVEN) PACK Take 1 packet by mouth 2 (two) times daily between meals. 60 packet 0   Oxycodone HCl 10 MG TABS Take 1 tablet (10 mg total) by mouth every 6  (six) hours as needed for up to 9 days. 35 tablet 0   polyethylene glycol powder (GLYCOLAX/MIRALAX) 17 GM/SCOOP powder Take 1 capful (17 g) in water 2 (two) times daily. 238 g 0   potassium chloride SA (KLOR-CON M) 20 MEQ tablet Take 1 tablet (20 mEq total) by mouth daily. 30 tablet 0   senna-docusate (SENOKOT-S) 8.6-50 MG tablet Take 2 tablets by mouth at bedtime. 60 tablet 0   spironolactone (ALDACTONE) 25 MG tablet Take 0.5 tablets (12.5 mg total) by mouth daily. 30 tablet 0   topiramate (TOPAMAX) 25 MG tablet Take 1 tablet (25 mg total) by mouth at bedtime. 30 tablet 0   fluticasone (FLONASE) 50 MCG/ACT nasal spray Place 2 sprays into both nostrils at bedtime. (Patient taking differently: Place 2 sprays into both nostrils at bedtime. prn) 16 g 0   naloxone (NARCAN) nasal spray 4 mg/0.1 mL Use in case of overdose 2 each 1   No facility-administered medications prior to visit.    ROS Review of Systems  Constitutional:  Positive for activity change, appetite change, fatigue and unexpected weight change. Negative for chills, diaphoresis and fever.  HENT:  Positive for congestion. Negative for postnasal drip, sinus pressure, sore throat and trouble swallowing.   Eyes:  Negative for visual disturbance.  Respiratory:  Positive for apnea. Negative for cough, chest tightness, shortness of breath and wheezing.   Cardiovascular:  Negative for chest pain, palpitations and leg swelling.  Gastrointestinal:  Positive for constipation. Negative for abdominal pain, diarrhea, nausea and vomiting.  Genitourinary: Negative.  Negative for difficulty urinating and dysuria.  Musculoskeletal:  Positive for back pain. Negative for myalgias.  Skin:  Positive for wound. Negative for color change, pallor and rash.  Neurological: Negative.  Negative for dizziness and weakness.  Hematological:  Negative for adenopathy. Does not bruise/bleed easily.  Psychiatric/Behavioral:  Positive for confusion, decreased  concentration, dysphoric mood and sleep disturbance. Negative for agitation, behavioral problems, hallucinations, self-injury and suicidal ideas. The patient is nervous/anxious. The patient is not hyperactive.     Objective:  BP 126/86 (BP Location: Right Arm, Patient Position: Sitting, Cuff Size: Large)   Pulse 90   Temp 98.1 F (36.7 C) (Oral)   Ht 5\' 4"  (1.626 m)   Wt 286 lb 9.6 oz (130 kg)   SpO2 94%   BMI 49.19 kg/m   BP Readings from Last 3 Encounters:  01/15/23 126/86  01/15/23 108/70  01/14/23 130/82    Wt Readings from Last 3 Encounters:  01/15/23 286 lb 9.6 oz (130 kg)  01/15/23 287 lb (130.2 kg)  01/14/23 287 lb 6.4 oz (130.4 kg)    Physical Exam Vitals reviewed.  Constitutional:      General: She is not in acute distress.    Appearance: She is ill-appearing. She is not  toxic-appearing or diaphoretic.  HENT:     Nose: Nose normal.     Mouth/Throat:     Mouth: Mucous membranes are moist.  Eyes:     General: No scleral icterus.    Conjunctiva/sclera: Conjunctivae normal.  Cardiovascular:     Rate and Rhythm: Normal rate and regular rhythm.     Heart sounds: No murmur heard.    No friction rub. No gallop.  Pulmonary:     Effort: Pulmonary effort is normal.     Breath sounds: No stridor. No wheezing, rhonchi or rales.  Abdominal:     General: Abdomen is protuberant. Bowel sounds are normal. There is no distension.     Palpations: Abdomen is soft. There is no hepatomegaly, splenomegaly or mass.     Tenderness: There is no abdominal tenderness. There is no guarding.  Musculoskeletal:        General: Normal range of motion.     Cervical back: Neck supple.     Right lower leg: No edema.     Left lower leg: No edema.  Lymphadenopathy:     Cervical: No cervical adenopathy.  Skin:    General: Skin is warm and dry.     Findings: No rash.  Neurological:     Mental Status: She is alert. Mental status is at baseline.  Psychiatric:        Attention and  Perception: She is inattentive. She does not perceive auditory or visual hallucinations.        Mood and Affect: Mood is anxious and depressed. Mood is not elated. Affect is labile. Affect is not blunt, angry or inappropriate.        Speech: She is communicative. Speech is rapid and pressured and tangential. Speech is not delayed.        Behavior: Behavior is agitated. Behavior is not slowed, aggressive, withdrawn, hyperactive or combative.        Thought Content: Thought content normal. Thought content is not paranoid or delusional. Thought content does not include homicidal or suicidal ideation. Thought content does not include homicidal or suicidal plan.        Cognition and Memory: Cognition is impaired. Memory is impaired.     Lab Results  Component Value Date   WBC 12.4 (H) 01/15/2023   HGB 13.8 01/15/2023   HCT 42.2 01/15/2023   PLT 423.0 (H) 01/15/2023   GLUCOSE 118 (H) 01/14/2023   CHOL 194 08/13/2022   TRIG 208 (H) 12/06/2022   HDL 48.90 08/13/2022   LDLDIRECT 143.8 04/03/2011   LDLCALC 117 (H) 08/13/2022   ALT 26 12/18/2022   AST 23 12/18/2022   NA 138 01/14/2023   K 4.2 01/14/2023   CL 102 01/14/2023   CREATININE 1.04 (H) 01/14/2023   BUN 23 (H) 01/14/2023   CO2 24 01/14/2023   TSH 4.29 01/15/2023   INR 1.2 02/25/2022   HGBA1C 6.6 (H) 12/07/2022   MICROALBUR 1.3 03/06/2022    No results found.  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 -     IBC + Ferritin; Future -     Reticulocytes; Future -     Vitamin B1; Future -     Zinc; Future -     Vitamin B12; Future -     CBC with Differential/Platelet; Future -     Folate; Future  Chronic idiopathic constipation -  Labs are negative for secondary causes. -     Magnesium; Future -     TSH; Future  Major depressive disorder, recurrent episode, severe with anxious  distress (HCC) -     AMB Referral [facility] Care Management  Type 2 diabetes mellitus with hyperglycemia, without long-term current use of insulin (HCC)- Her blood sugar is well controlled. -     Dexcom G7 Sensor; 1 Act by Does not apply route daily.  Dispense: 9 each; Refill: 1 -     Dexcom G7 Receiver; 1 Act by Does not apply route daily.  Dispense: 9 each; Refill: 1  Nasal congestion -     Levocetirizine Dihydrochloride; Take 1 tablet (5 mg total) by mouth every evening.  Dispense: 90 tablet; Refill: 0     Follow-up: Return in about 3 months (around 04/17/2023).  Sanda Linger, MD

## 2023-01-15 NOTE — Patient Instructions (Signed)
Medication Instructions:  Your physician recommends that you continue on your current medications as directed. Please refer to the Current Medication list given to you today.  *If you need a refill on your cardiac medications before your next appointment, please call your pharmacy*  Lab Work: None ordered If you have labs (blood work) drawn today and your tests are completely normal, you will receive your results only by: MyChart Message (if you have MyChart) OR A paper copy in the mail If you have any lab test that is abnormal or we need to change your treatment, we will call you to review the results.  Follow-Up: At [facility], you and your health needs are our priority.  As part of our continuing mission to provide you with exceptional heart care, we have created designated Provider Care Teams.  These Care Teams include your primary Cardiologist (physician) and Advanced Practice Providers (APPs -  Physician Assistants and Nurse Practitioners) who all work together to provide you with the care you need, when you need it.  Your next appointment:   3 month(s)  Provider:   Loman Brooklyn, MD only

## 2023-01-16 ENCOUNTER — Encounter (HOSPITAL_COMMUNITY): Payer: Medicare Other | Admitting: Physician Assistant

## 2023-01-16 ENCOUNTER — Telehealth: Payer: Self-pay

## 2023-01-16 DIAGNOSIS — R0981 Nasal congestion: Secondary | ICD-10-CM | POA: Insufficient documentation

## 2023-01-16 DIAGNOSIS — I5032 Chronic diastolic (congestive) heart failure: Secondary | ICD-10-CM | POA: Diagnosis not present

## 2023-01-16 DIAGNOSIS — I152 Hypertension secondary to endocrine disorders: Secondary | ICD-10-CM | POA: Diagnosis not present

## 2023-01-16 DIAGNOSIS — I96 Gangrene, not elsewhere classified: Secondary | ICD-10-CM | POA: Diagnosis not present

## 2023-01-16 DIAGNOSIS — I5023 Acute on chronic systolic (congestive) heart failure: Secondary | ICD-10-CM | POA: Diagnosis not present

## 2023-01-16 DIAGNOSIS — T8089XA Other complications following infusion, transfusion and therapeutic injection, initial encounter: Secondary | ICD-10-CM | POA: Diagnosis not present

## 2023-01-16 DIAGNOSIS — T8189XA Other complications of procedures, not elsewhere classified, initial encounter: Secondary | ICD-10-CM | POA: Diagnosis not present

## 2023-01-16 MED ORDER — LEVOCETIRIZINE DIHYDROCHLORIDE 5 MG PO TABS: 5.0000 mg | ORAL_TABLET | Freq: Every evening | ORAL | 0 refills | Status: DC

## 2023-01-16 NOTE — Telephone Encounter (Signed)
Faxed wound care instructions to [facility] per Alan Ripper, [place] request. Received fax success confirmation. Forwarded original document to front desk for batch scanning.

## 2023-01-17 ENCOUNTER — Other Ambulatory Visit: Payer: Self-pay | Admitting: Licensed Clinical Social Worker

## 2023-01-17 NOTE — Patient Outreach (Signed)
Medicaid Managed Care Social Work Note  01/17/2023 Name:  [name] MRN:  [id number] DOB:  05/20/1965  [name] is an 57 y.o. year old female who is a primary patient of [name], MD.  The Medicaid Managed Care Coordination team was consulted for assistance with:  Mental Health Counseling and Resources  Engaged with patient  for by telephone forinitial visit in response to referral for case management and/or care coordination services.   Assessments/Interventions:  Review of past medical history, allergies, medications, health status, including review of consultants reports, laboratory and other test data, was performed as part of comprehensive evaluation and provision of chronic care management services.  SDOH: (Social Determinant of Health) assessments and interventions performed: SDOH Interventions    Flowsheet Row Patient Outreach Telephone from 01/17/2023 in [facility] Most recent reading at 01/17/2023 12:01 PM ED to Hosp-Admission (Discharged) from 12/05/2022 in [facility] 3E HF PCU Most recent reading at 12/11/2022 10:12 AM Clinical Support from 12/05/2022 in [facility] Most recent reading at 12/05/2022  1:31 PM Care Coordination from 11/12/2022 in [facility] Coordination Most recent reading at 11/12/2022  2:29 PM Office Visit from 10/24/2022 in [facility] Most recent reading at 10/24/2022  6:10 PM Care Coordination from 08/12/2022 in [facility] Most recent reading at 08/12/2022  3:03 PM  SDOH Interventions        Housing Interventions -- Intervention Not Indicated -- -- -- Intervention Not Indicated  Transportation Interventions -- Intervention Not Indicated -- -- -- --  Alcohol Usage Interventions -- Intervention Not Indicated (Score <7) -- -- -- --  Depression Interventions/Treatment  Currently on Treatment  -- Currently on Treatment Medication, Currently on Treatment Medication, Counseling --  Financial Strain Interventions -- Intervention Not Indicated -- -- -- --  Physical Activity Interventions -- -- -- Other (Comments)  [patient has decreased activities] -- --  Stress Interventions [employer], Provide Counseling  [Patient's depression and anxiety symptoms are present] -- -- Other (Comment)  [discussed client needs. She has stress in managing mental health needs. She has stress in managing medical needs] -- --       Advanced Directives Status:  Not addressed in this encounter.  Care Plan                 Allergies  Allergen Reactions   Fluoxetine Other (See Comments)    More depressed   Cymbalta [Duloxetine Hcl] Other (See Comments)    depressed   Pork-Derived Products    Penicillins Rash    Has patient had a PCN reaction causing immediate rash, facial/tongue/throat swelling, SOB or lightheadedness with hypotension: YES Has patient had a PCN reaction causing severe rash involving mucus membranes or skin necrosis: NO Has patient had a PCN reaction that required hospitalization: YES Has patient had a PCN reaction occurring within the last 10 years: NO If all of the above answers are "NO", then may proceed with Cephalosporin use.     Medications Reviewed Today   Medications were not reviewed in this encounter     Patient Active Problem List   Diagnosis Date Noted   Nasal congestion 01/16/2023   Deficiency anemia 01/15/2023   Need for immunization against influenza 01/15/2023   Diabetes mellitus (HCC) 12/19/2022   Wound of left leg 12/10/2022   Ventricular fibrillation (HCC) 12/10/2022   Onychomycosis 11/15/2022   Anxiety 10/27/2022   Rosacea, acne  08/13/2022   Primary osteoarthritis of both knees 03/06/2022   Bilateral ovarian cysts 03/06/2022   Major depressive disorder, recurrent episode, severe with anxious distress (HCC) 08/09/2021   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   Need for varicella vaccine 08/09/2021   Urge incontinence 08/01/2021   Tinea corporis 03/14/2021   Hyperlipidemia LDL goal <100 03/01/2021   DDD (degenerative disc disease), lumbar 08/08/2020   Non-seasonal allergic rhinitis due to pollen 07/25/2020   Chronic idiopathic constipation 07/25/2020   Visit for screening mammogram 02/11/2020   Chronic bilateral low back pain without sciatica 02/10/2020   Spondylosis without myelopathy or radiculopathy, lumbar region 12/04/2017   Type 2 diabetes mellitus with hyperglycemia (HCC)    Normal coronary arteries 04/17/2016   OSA on CPAP 10/28/2014   Chronic combined systolic and diastolic heart failure (HCC) 12/06/2013   Hyperlipidemia associated with type 2 diabetes mellitus (HCC), on Zocor 04/04/2011   Hypertension associated with diabetes (HCC) 06/03/2008   Morbid obesity with BMI of 50.0-59.9, adult (HCC) 07/12/2006   NICM (nonischemic cardiomyopathy) (HCC) 07/12/2006   Migraine without status migrainosus, not intractable 07/12/2006   Patient has [employer] and was advised to contact program to get connected with their case management program and provided benefits. Patient is agreeable. Patient reports being interested in gaining specifically mental health related resources. [facility] LCSW provided patient with extensive education on behavioral health resources within her area. Patient agreeable to consider behavioral health support through [facility] as well. Patient is scheduled with [facility] for therapy next year but was advised to strongly consider going to their walk in clinic in order to get seen sooner or to contact them daily for cancellations and to be added to their wait list. Patient was also provided emotional support over the phone. She denies any SI/HI but admits she is in a stressful situation regarding her depressive symptoms that have been triggered due to her physical pain.  Patient was emailed a list of resources for mental health support in her area as well as contact numbers and information regarding her [facility] plan.  Patient will contact [name] and get connected with their case management program. Patient wrote this number physically down and agreed to contact them as soon as possible to gain these case management services. Per [facility] program policy, [facility] LCSW will close referral not to duplicate services.   Follow up: Patient will contact [facility] and get connected with their benefits and case management services. Detailed education provided on how to do this.  Plan: [facility] LCSW will sign off at this time.  Dickie La, BSW, MSW, LCSW Licensed Clinical Social Worker [employer]   [facility] [email or web address] Direct Dial: [phone]

## 2023-01-17 NOTE — Patient Instructions (Signed)
 Tailored Plan Medicaid On July 1, some people on [place] [employer] will move to a new kind of Medicaid health plan called a Tailored Plan. Tailored Plans cover your doctor visits, prescription drugs, and health care services.    If your [place] Medicaid will move to a Tailored Plan, you should have gotten a letter and welcome packet. If you're not sure, call your [place] Medicaid Enrollment Broker at [phone] and ask.  Check out these free materials, in [place] and [place], to learn more about your Tailored Plan: [email or web address]-[email or web address]  Tailored Care Management Services  TCM services are available to you now. If you are a Tailored Plan member or will be and want information about Tailored Care Management Services including rides to appointments and community and home services, call the Care Management provider for your county of residence:    [facility] ([place], [place])  Member Services: [phone] Behavioral Health Crisis Line: 9081880558, [place], [place], [place], [place])  Member Services: [phone] Behavioral Health Crisis Line: [phone]  [facility] [place] [facility]) Member Services: [phone] Behavioral Health Crisis Line: [phone]

## 2023-01-20 DIAGNOSIS — I5023 Acute on chronic systolic (congestive) heart failure: Secondary | ICD-10-CM | POA: Diagnosis not present

## 2023-01-20 DIAGNOSIS — T8189XA Other complications of procedures, not elsewhere classified, initial encounter: Secondary | ICD-10-CM | POA: Diagnosis not present

## 2023-01-20 DIAGNOSIS — T8089XA Other complications following infusion, transfusion and therapeutic injection, initial encounter: Secondary | ICD-10-CM | POA: Diagnosis not present

## 2023-01-20 DIAGNOSIS — I96 Gangrene, not elsewhere classified: Secondary | ICD-10-CM | POA: Diagnosis not present

## 2023-01-20 DIAGNOSIS — I5032 Chronic diastolic (congestive) heart failure: Secondary | ICD-10-CM | POA: Diagnosis not present

## 2023-01-20 DIAGNOSIS — I152 Hypertension secondary to endocrine disorders: Secondary | ICD-10-CM | POA: Diagnosis not present

## 2023-01-21 ENCOUNTER — Other Ambulatory Visit (HOSPITAL_COMMUNITY): Payer: Self-pay | Admitting: Physician Assistant

## 2023-01-21 ENCOUNTER — Ambulatory Visit: Payer: Medicare Other | Admitting: Student

## 2023-01-21 DIAGNOSIS — F332 Major depressive disorder, recurrent severe without psychotic features: Secondary | ICD-10-CM

## 2023-01-21 LAB — VITAMIN B1: Vitamin B1 (Thiamine): 35 nmol/L — ABNORMAL HIGH (ref 8–30)

## 2023-01-21 LAB — RETICULOCYTES
ABS Retic: 101000 {cells}/uL — ABNORMAL HIGH (ref 20000–80000)
Retic Ct Pct: 2 %

## 2023-01-21 LAB — ZINC: Zinc: 60 ug/dL (ref 60–130)

## 2023-01-21 MED ORDER — VORTIOXETINE HBR 5 MG PO TABS: 5.0000 mg | ORAL_TABLET | Freq: Every day | ORAL | 1 refills | Status: DC

## 2023-01-21 NOTE — Progress Notes (Signed)
Provider reached out to patient in regards to medication management.  Patient had a recent EKG performed on 01/14/2023. Her Qtc value was found to be 469 ms which is considered prolonged. Provider discussed with patient about putting her on an antidepressant that has a lower risk of Qtc prolongation at therapeutic doses. Provider recommended patient be placed on Trintellix since patient tolerated medication in the past. Patient to be placed on Trintellix for the management of her depressive symptoms. Patient was agreeable to recommendation.  Patient also informed provider that Ritalin does not appear to be effective in managing her symptoms of attention deficit disorder. She inquired about being taken off the medication since she did not experience any benefits. She reports that her son is currently being managed on Strattera for the management of his ADHD. She reports that the medication has worked well for him and would like to tryout the medication for the management of her symptoms. Provider informed patient that Wilhemena Durie can cause an increase in blood pressure and heart rate which may directly impact her history of cardiomyopathy. Provider to reach out to patient's Cardiologist to discuss whether or not she is stable enough to be placed on the medication.  Patient's next appointment is scheduled for December 10th at 3:00 PM.

## 2023-01-21 NOTE — Progress Notes (Signed)
Cardiology Office Note:   Date:  01/24/2023  ID:  [name], DOB 1965-05-31, MRN [id number] PCP: Samantha Grandchild, MD  [facility] Providers Cardiologist:  [name], MD {  History of Present Illness:   [name] is a 57 y.o. female with a history of a nonischemic cardiomyopathy.  She had cardiac arrest on October 10.  This was a V-fib arrest requiring ventilator support with prolonged resuscitation.  She had some short-term memory deficits but actually recovered remarkably well following this.  Unfortunately she had an intraosseous IV in the field and got cellulitis related to this with a full-thickness wound that has required plastic surgery for debridement.  Because of that she did not get an ICD and was sent home on a LifeVest while this wound is healing.  She did have a cardiac catheterization as part of her evaluation.  This demonstrated normal coronaries.  MRI demonstrated an EF of 31%.  Her echocardiogram demonstrated an EF to be 30 to 35%.  There were no other abnormal findings.  She has had follow-up in the wound clinic.  She has been followed in the [facility] and in EP.    Today we reviewed her meds.  She is seeing her psychiatrist and she is on Ritalin.  He also has prescribed Trintellix which she has not yet started.  She has an usual anxiety and depression.  Her husband has been with her throughout this hospitalization and that appointments.  He seems to be very supportive.  She is not having any palpitations, presyncope or syncope.  She is not having any chest pressure, neck or arm discomfort.  She has had no weight gain or edema.  She denies any new chest discomfort, neck or arm discomfort.  She is not having any new PND or orthopnea.  ROS: As stated in the HPI and negative for all other systems.  Studies Reviewed:    EKG:   NA  Risk Assessment/Calculations:         Physical Exam:   VS:  BP 100/62 (BP Location: Left Arm, Patient Position: Sitting,  Cuff Size: Large)   Pulse 91   Ht 5\' 4"  (1.626 m)   Wt 282 lb (127.9 kg)   SpO2 97%   BMI 48.41 kg/m    Wt Readings from Last 3 Encounters:  01/22/23 282 lb (127.9 kg)  01/15/23 286 lb 9.6 oz (130 kg)  01/15/23 287 lb (130.2 kg)     GEN: Well nourished, well developed in no acute distress NECK: No JVD; No carotid bruits CARDIAC: RRR, no murmurs, rubs, gallops RESPIRATORY:  Clear to auscultation without rales, wheezing or rhonchi  ABDOMEN: Soft, non-tender, non-distended EXTREMITIES:  No edema; No deformity   ASSESSMENT AND PLAN:   Chronic HFrEF, NICM :  She has not tolerated med titration with light headedness.    Today I am not going to try to titrate meds again because of some lower blood pressures.  She is up-to-date with blood work.  I am to see her next month to see if there is any further med titration we can obtain and I will check electrolytes at that time.  Cardiac Arrest, VF: She will continue with a LifeVest pending improvement in her wound.  She will then need an ICD for secondary prevention.  She is having people come to the house twice a week and she is going to the wounds center and is having good wound healing.  She is going to be  seen again in February in the EP clinic.   Chronic [name]:   She is having this followed by plastic surgery.    DMII: A1c was 6.6.  No change in therapy.  Anxiety: She had anxiety prior to this event and so now this is compounded.  She also has ADD and we did improve her stimulants as she is quite dependent on these.  We spent a long time talking about combination therapy.  She currently taking the Ritalin and will continue on this the current low dose.  She can also take the Trintellix which does not typically cause any QT prolongation or issues.   OSA:    She is supposed to be using CPAP but she is supposed to pick up another nasal spray and she is not able to use this right now so she is having fatigue.  She is encouraged to go get the  prescription that was just given to her for some sinus congestion and start her CPAP.  Follow up with me next week.   Signed, [name], MD

## 2023-01-22 ENCOUNTER — Ambulatory Visit: Payer: Medicare Other | Attending: Cardiology | Admitting: Cardiology

## 2023-01-22 ENCOUNTER — Encounter: Payer: Self-pay | Admitting: Cardiology

## 2023-01-22 ENCOUNTER — Ambulatory Visit: Payer: Medicare Other | Admitting: Student

## 2023-01-22 ENCOUNTER — Ambulatory Visit: Payer: Medicare Other | Admitting: Plastic Surgery

## 2023-01-22 ENCOUNTER — Ambulatory Visit: Payer: Medicare Other | Admitting: Cardiology

## 2023-01-22 ENCOUNTER — Encounter: Payer: Self-pay | Admitting: Student

## 2023-01-22 VITALS — BP 124/84 | HR 101

## 2023-01-22 VITALS — BP 100/62 | HR 91 | Ht 64.0 in | Wt 282.0 lb

## 2023-01-22 DIAGNOSIS — I5022 Chronic systolic (congestive) heart failure: Secondary | ICD-10-CM | POA: Diagnosis not present

## 2023-01-22 DIAGNOSIS — I428 Other cardiomyopathies: Secondary | ICD-10-CM | POA: Diagnosis not present

## 2023-01-22 DIAGNOSIS — I4901 Ventricular fibrillation: Secondary | ICD-10-CM | POA: Insufficient documentation

## 2023-01-22 DIAGNOSIS — S81802D Unspecified open wound, left lower leg, subsequent encounter: Secondary | ICD-10-CM

## 2023-01-22 MED ORDER — SANTYL 250 UNIT/GM EX OINT: 1.0000 | TOPICAL_OINTMENT | Freq: Every day | CUTANEOUS | 0 refills | Status: DC

## 2023-01-22 NOTE — Progress Notes (Signed)
Referring Provider [name], MD [address] [place],  [place] [address]   CC:  Chief Complaint  Patient presents with   Follow-up      [name] is an 57 y.o. female.  HPI: 57 year old female with a past medical history of postpartum cardiomyopathy, history of CHF with EF 30 to 35%, DM, morbid obesity who presented to [facility] ED on 12/05/2022 via EMS for cardiac arrest.  She was admitted to the hospital, initially in the ICU and intubated. She did receive intraosseous fluids with subsequent skin changes involving the left lower extremity and resulting left lower extremity wound and some skin necrosis.  Patient presents to our clinic today for further follow-up of her left lower extremity wound.   Patient was last seen in the clinic on 01/13/2023.  At this visit, patient reported that home health nursing was coming a few times a week and doing her dressing changes.  She stated that she was still experiencing some pain to her left lower extremity.  She denied any infectious symptoms.  On exam, there was a very large wound extending from the mid thigh to the mid shin.  Overlying the entirety of the wound was black eschar.  Recommended that patient apply Santyl, Vaseline, Xeroform, ABD pads, Kerlix and Ace wrap daily to her wound.  Today, patient presents with husband at bedside.  She states that she is still having pain to her left lower extremity.  She states that she did see her PCP, but does not feel that her pain has been improved since seeing them.  She states that she has been taking oxycodone and Tylenol sometimes.  She denies any fevers or chills.  Denies any infectious symptoms.  Patient and patient's husband are requesting more Santyl be ordered at today's visit.  Review of Systems General: Denies any fevers or chills  Physical Exam    01/15/2023    2:30 PM 01/15/2023   10:49 AM 01/14/2023    2:15 PM  Vitals with BMI  Height 5\' 4"  5\' 4"    Weight 286 lbs 10 oz  287 lbs 287 lbs 6 oz  BMI 49.17 49.24   Systolic 126 108 191  Diastolic 86 70 82  Pulse 90 93 92    General:  No acute distress,  Alert and oriented, Non-Toxic, Normal speech and affect On exam, patient is sitting upright in no acute distress.  Wound to the left lower extremity is noted from the mid thigh to the mid shin.  There is black eschar noted throughout the wound.  There is a slight odor.  There does appear to be some separation of the eschar at the periphery of the wound.  Erythema to the skin inferior of the wound does appear to be improved.  Eschar does appear to have softened since previous visit.  No signs of infection on exam.  Assessment/Plan  Wound of left lower extremity, subsequent encounter   Discussed with patient and patient's husband that they should continue with Santyl, Vaseline, Xeroform, ABDs, Kerlix and Ace wrap daily.  Discussed with them that it does appear to be slightly improved from previous exam.  Discussed with patient that she will have pain with this wound.  We did discuss referral to pain management if her pain cannot be controlled.  Will place order for pain management.  Patient to follow back up in 3 weeks.  I instructed her to call in the meantime she is any questions or concerns.  Dr. Ladona Ridgel also had the opportunity to evaluate the patient today and is in agreement the plan.  Pictures were obtained of the patient and placed in the chart with the patient's or guardian's permission.   [name] 01/22/2023, 11:49 AM

## 2023-01-22 NOTE — Patient Instructions (Addendum)
  Medication Instructions:  No changes.  *If you need a refill on your cardiac medications before your next appointment, please call your pharmacy*  Follow-Up: At [facility], you and your health needs are our priority.  As part of our continuing mission to provide you with exceptional heart care, we have created designated Provider Care Teams.  These Care Teams include your primary Cardiologist (physician) and Advanced Practice Providers (APPs -  Physician Assistants and Nurse Practitioners) who all work together to provide you with the care you need, when you need it.  Your next appointment:   1 month(s)  Provider:   [name], MD

## 2023-01-24 ENCOUNTER — Encounter: Payer: Self-pay | Admitting: Cardiology

## 2023-01-24 ENCOUNTER — Other Ambulatory Visit: Payer: Self-pay | Admitting: Physical Medicine and Rehabilitation

## 2023-01-24 ENCOUNTER — Other Ambulatory Visit: Payer: Self-pay

## 2023-01-24 DIAGNOSIS — T8089XA Other complications following infusion, transfusion and therapeutic injection, initial encounter: Secondary | ICD-10-CM | POA: Diagnosis not present

## 2023-01-24 DIAGNOSIS — F419 Anxiety disorder, unspecified: Secondary | ICD-10-CM

## 2023-01-24 DIAGNOSIS — I96 Gangrene, not elsewhere classified: Secondary | ICD-10-CM | POA: Diagnosis not present

## 2023-01-24 DIAGNOSIS — I5023 Acute on chronic systolic (congestive) heart failure: Secondary | ICD-10-CM | POA: Diagnosis not present

## 2023-01-24 DIAGNOSIS — I5032 Chronic diastolic (congestive) heart failure: Secondary | ICD-10-CM | POA: Diagnosis not present

## 2023-01-24 DIAGNOSIS — T8189XA Other complications of procedures, not elsewhere classified, initial encounter: Secondary | ICD-10-CM | POA: Diagnosis not present

## 2023-01-24 DIAGNOSIS — I152 Hypertension secondary to endocrine disorders: Secondary | ICD-10-CM | POA: Diagnosis not present

## 2023-01-26 ENCOUNTER — Other Ambulatory Visit: Payer: Self-pay

## 2023-01-27 ENCOUNTER — Encounter: Payer: Self-pay | Admitting: Internal Medicine

## 2023-01-27 ENCOUNTER — Other Ambulatory Visit: Payer: Self-pay

## 2023-01-27 ENCOUNTER — Ambulatory Visit (INDEPENDENT_AMBULATORY_CARE_PROVIDER_SITE_OTHER): Payer: Medicare Other | Admitting: Internal Medicine

## 2023-01-27 VITALS — BP 128/86 | HR 98 | Temp 97.8°F | Resp 16 | Ht 64.0 in | Wt 278.6 lb

## 2023-01-27 DIAGNOSIS — E785 Hyperlipidemia, unspecified: Secondary | ICD-10-CM | POA: Diagnosis not present

## 2023-01-27 DIAGNOSIS — T148XXA Other injury of unspecified body region, initial encounter: Secondary | ICD-10-CM

## 2023-01-27 DIAGNOSIS — R300 Dysuria: Secondary | ICD-10-CM | POA: Diagnosis not present

## 2023-01-27 DIAGNOSIS — L089 Local infection of the skin and subcutaneous tissue, unspecified: Secondary | ICD-10-CM | POA: Diagnosis not present

## 2023-01-27 DIAGNOSIS — Z515 Encounter for palliative care: Secondary | ICD-10-CM

## 2023-01-27 DIAGNOSIS — R0981 Nasal congestion: Secondary | ICD-10-CM

## 2023-01-27 MED ORDER — CEFDINIR 300 MG PO CAPS: 300.0000 mg | ORAL_CAPSULE | Freq: Two times a day (BID) | ORAL | 0 refills | Status: DC

## 2023-01-27 MED ORDER — SULFAMETHOXAZOLE-TRIMETHOPRIM 800-160 MG PO TABS: 1.0000 | ORAL_TABLET | Freq: Two times a day (BID) | ORAL | 0 refills | Status: DC

## 2023-01-27 MED ORDER — FLUTICASONE PROPIONATE 50 MCG/ACT NA SUSP: 2.0000 | Freq: Every day | NASAL | 0 refills | Status: DC

## 2023-01-27 NOTE — Progress Notes (Signed)
Subjective:  Patient ID: [name], female    DOB: [birth year]  Age: 57 y.o. MRN: [id number]  CC: Hypertension and Diabetes   HPI Drystal Nersesian presents for a f/up ---  Discussed the use of AI scribe software for clinical note transcription with the patient, who gave verbal consent to proceed.  History of Present Illness   The patient presents with increasing concern about her leg, specifically worsening pain and a perceived abnormality in the leg's appearance. She is unsure of the cause of these symptoms, expressing worry about potential infection. The patient also has a history of issues with her right knee, for which she previously received injections as part of her treatment. The current state of this knee condition is not elaborated upon further in the conversation.       Outpatient Medications Prior to Visit  Medication Sig Dispense Refill   acetaminophen (TYLENOL) 325 MG tablet Take 1-2 tablets (325-650 mg total) by mouth every 4 (four) hours as needed for mild pain (pain score 1-3). 100 tablet 0   ALPRAZolam (XANAX) 0.5 MG tablet Take 1 tablet (0.5 mg total) by mouth 2 (two) times daily as needed for anxiety (if anxiety unrelieved by hydroxyzine). 30 tablet 0   ascorbic acid (VITAMIN C) 500 MG tablet Take 1 tablet (500 mg total) by mouth daily. 30 tablet 0   collagenase (SANTYL) 250 UNIT/GM ointment Apply 1 Application topically daily. 15 g 0   diclofenac Sodium (VOLTAREN) 1 % GEL Apply 2 g topically 4 (four) times daily. To jaw for TMJ symptoms 350 g 0   gabapentin (NEURONTIN) 300 MG capsule Take 1 capsule (300 mg total) by mouth 3 (three) times daily. 90 capsule 0   Glucose Blood (BLOOD GLUCOSE TEST STRIPS) STRP 1 each by In Vitro route in the morning, at noon, and at bedtime. May substitute to any manufacturer covered by patient's insurance. 100 strip 11   lidocaine (LIDODERM) 5 % Place 2 patches onto the skin daily. Remove & Discard patch within 12 hours or as directed by  MD 60 patch 0   methylphenidate (RITALIN) 5 MG tablet Take 1 tablet (5 mg total) by mouth 2 (two) times daily with breakfast and lunch. 60 tablet 0   Multiple Vitamin (MULTIVITAMIN WITH MINERALS) TABS tablet Take 1 tablet by mouth daily. 30 tablet 0   NON FORMULARY Pt uses cpap nightly     nutrition supplement, JUVEN, (JUVEN) PACK Take 1 packet by mouth 2 (two) times daily between meals. 60 packet 0   polyethylene glycol powder (GLYCOLAX/MIRALAX) 17 GM/SCOOP powder Take 1 capful (17 g) in water 2 (two) times daily. 238 g 0   senna-docusate (SENOKOT-S) 8.6-50 MG tablet Take 2 tablets by mouth at bedtime. 60 tablet 0   topiramate (TOPAMAX) 25 MG tablet Take 1 tablet (25 mg total) by mouth at bedtime. 30 tablet 0   vortioxetine HBr (TRINTELLIX) 5 MG TABS tablet Take 1 tablet (5 mg total) by mouth daily. 30 tablet 1   atorvastatin (LIPITOR) 80 MG tablet Take 1 tablet (80 mg total) by mouth daily. 30 tablet 0   Blood Glucose Monitoring Suppl DEVI 1 each by Does not apply route in the morning, at noon, and at bedtime. May substitute to any manufacturer covered by patient's insurance. 1 each 0   collagenase (SANTYL) 250 UNIT/GM ointment Apply 1 gram topically 2 (two) times daily to area on knee, cover with calcium alginate, 4 X 4 and kerlix 60 g 0  collagenase (SANTYL) 250 UNIT/GM ointment Apply 1 Application topically daily. 15 g 0   Continuous Glucose Receiver (DEXCOM G7 RECEIVER) DEVI 1 Act by Does not apply route daily. 9 each 1   Continuous Glucose Sensor (DEXCOM G7 SENSOR) MISC 1 Act by Does not apply route daily. 9 each 1   dapagliflozin propanediol (FARXIGA) 10 MG TABS tablet Take 1 tablet (10 mg total) by mouth daily. 30 tablet 0   furosemide (LASIX) 40 MG tablet Take 1 tablet (40 mg total) by mouth daily. 30 tablet 0   levocetirizine (XYZAL) 5 MG tablet Take 1 tablet (5 mg total) by mouth every evening. 90 tablet 0   losartan (COZAAR) 25 MG tablet Take 0.5 tablets (12.5 mg total) by mouth  daily. 30 tablet 0   magnesium oxide (MAG-OX) 400 MG tablet Take 1 tablet (400 mg total) by mouth at bedtime. 30 tablet 0   metFORMIN (GLUCOPHAGE-XR) 500 MG 24 hr tablet Take 1 tablet (500 mg total) by mouth 2 (two) times daily with a meal. 60 tablet 0   metoprolol succinate (TOPROL-XL) 50 MG 24 hr tablet Take 1 tablet (50 mg total) by mouth daily. Take with or immediately following a meal. 30 tablet 0   potassium chloride SA (KLOR-CON M) 20 MEQ tablet Take 1 tablet (20 mEq total) by mouth daily. 30 tablet 0   spironolactone (ALDACTONE) 25 MG tablet Take 0.5 tablets (12.5 mg total) by mouth daily. 30 tablet 0   cephALEXin (KEFLEX) 500 MG capsule Take 1 capsule (500 mg total) by mouth every 8 (eight) hours. (Patient not taking: Reported on 01/22/2023) 21 capsule 0   No facility-administered medications prior to visit.    ROS Review of Systems  Constitutional: Negative.  Negative for chills, diaphoresis, fatigue and fever.  HENT:  Positive for congestion. Negative for nosebleeds, sinus pressure and sore throat.   Respiratory: Negative.  Negative for cough, chest tightness, shortness of breath and wheezing.   Cardiovascular:  Negative for chest pain, palpitations and leg swelling.  Gastrointestinal:  Negative for abdominal pain, constipation, diarrhea, nausea and vomiting.  Genitourinary:  Positive for dysuria. Negative for difficulty urinating and urgency.  Musculoskeletal:  Positive for arthralgias and back pain. Negative for myalgias.  Skin: Negative.   Neurological: Negative.  Negative for dizziness.  Hematological:  Negative for adenopathy. Does not bruise/bleed easily.  Psychiatric/Behavioral:  Positive for decreased concentration, dysphoric mood and sleep disturbance. Negative for confusion. The patient is not nervous/anxious.     Objective:  BP 128/86 (BP Location: Right Arm, Patient Position: Sitting, Cuff Size: Normal)   Pulse 98   Temp 97.8 F (36.6 C) (Oral)   Resp 16   Ht 5'  4" (1.626 m)   Wt 278 lb 9.6 oz (126.4 kg)   SpO2 91%   BMI 47.82 kg/m   BP Readings from Last 3 Encounters:  01/28/23 110/73  01/27/23 128/86  01/22/23 100/62    Wt Readings from Last 3 Encounters:  01/27/23 278 lb 9.6 oz (126.4 kg)  01/22/23 282 lb (127.9 kg)  01/15/23 286 lb 9.6 oz (130 kg)    Physical Exam Vitals reviewed.  Constitutional:      General: She is not in acute distress.    Appearance: She is ill-appearing. She is not toxic-appearing or diaphoretic.  HENT:     Nose: Congestion present.     Mouth/Throat:     Mouth: Mucous membranes are moist.  Eyes:     General: No scleral icterus.  Conjunctiva/sclera: Conjunctivae normal.  Cardiovascular:     Rate and Rhythm: Normal rate and regular rhythm.     Heart sounds: No murmur heard.    No friction rub. No gallop.  Pulmonary:     Effort: Pulmonary effort is normal.     Breath sounds: No stridor. No wheezing, rhonchi or rales.  Abdominal:     General: Abdomen is protuberant. There is no distension.     Palpations: There is no hepatomegaly, splenomegaly or mass.     Tenderness: There is no abdominal tenderness.  Musculoskeletal:        General: Normal range of motion.     Cervical back: Neck supple.     Right lower leg: No edema.     Left lower leg: No edema.  Lymphadenopathy:     Cervical: No cervical adenopathy.  Skin:    General: Skin is warm and dry.  Neurological:     General: No focal deficit present.     Mental Status: She is alert.     Lab Results  Component Value Date   WBC 12.4 (H) 01/15/2023   HGB 13.8 01/15/2023   HCT 42.2 01/15/2023   PLT 423.0 (H) 01/15/2023   GLUCOSE 118 (H) 01/14/2023   CHOL 194 08/13/2022   TRIG 208 (H) 12/06/2022   HDL 48.90 08/13/2022   LDLDIRECT 143.8 04/03/2011   LDLCALC 117 (H) 08/13/2022   ALT 26 12/18/2022   AST 23 12/18/2022   NA 138 01/14/2023   K 4.2 01/14/2023   CL 102 01/14/2023   CREATININE 1.04 (H) 01/14/2023   BUN 23 (H) 01/14/2023   CO2  24 01/14/2023   TSH 4.29 01/15/2023   INR 1.2 02/25/2022   HGBA1C 6.6 (H) 12/07/2022   MICROALBUR 1.3 03/06/2022    No results found.  Assessment & Plan:   Acute post-traumatic wound infection, initial encounter- Clx is positive for pseudomonas. Cipro has been prescribed. -     Cefdinir; Take 1 capsule (300 mg total) by mouth 2 (two) times daily for 7 days.  Dispense: 14 capsule; Refill: 0 -     WOUND CULTURE; Future -     Ambulatory referral to Plastic Surgery -     AMB Referral VBCI Care Management  Nasal congestion -     Fluticasone Propionate; Place 2 sprays into both nostrils daily.  Dispense: 48 g; Refill: 0  Hyperlipidemia LDL goal <100 -     Atorvastatin Calcium; Take 1 tablet (80 mg total) by mouth daily.  Dispense: 90 tablet; Refill: 0  Encounter for palliative care involving management of pain -     oxyCODONE HCl; Take 1 tablet (10 mg total) by mouth 3 (three) times daily as needed.  Dispense: 90 tablet; Refill: 0     Follow-up: Return in about 1 week (around 02/03/2023).  Sanda Linger, MD

## 2023-01-27 NOTE — Patient Instructions (Signed)
Wound Infection A wound infection happens when germs start to grow in a wound. Infection can cause the wound to break open or get worse. Wound infections need treatment. If a wound infection is not treated, serious problems can happen. These problems could include getting an infection in your blood (septicemia) or bones (osteomyelitis). What are the causes? A wound infection is most often caused by bacteria growing in a wound. Other germs, like yeast and fungi, can also cause wound infections. What increases the risk? The following factors may make you more likely to develop a wound infection: A weak body defense system (immune system). Diabetes. Taking steroid medicines for a long time (chronic use). Smoking. Being an older adult. Obesity. Taking chemotherapy medicines. Poor nutrition. What are the signs or symptoms? Symptoms of a wound infection include: More redness, swelling, or pain at the wound site. More blood or fluid at the wound site. A bad smell coming from a wound or bandage (dressing). Fever. Feeling tired (fatigued). Warmth at or around the wound. Pus at the wound site. How is this diagnosed? A wound infection is diagnosed based on your symptoms, medical history, and physical exam. You may also have a wound culture, blood tests, or both. How is this treated? This condition is usually treated with antibiotics and medicines that lower inflammation. The infection should get better in 24-48 hours after starting antibiotics. After 24-48 hours, redness around the wound should stop spreading. The wound should also be less painful. If the condition is severe, you may need to stay at the hospital and get antibiotics through an IV. Follow these instructions at home: Medicines Take or apply over-the-counter and prescription medicines only as told by your health care provider. If you were prescribed antibiotics, take or apply them as told by your provider. Do not stop using the  antibiotic even if you start to feel better. Wound care  Clean the wound each day or as told by your provider. Wash the wound with mild soap and water. Rinse the wound with water to remove all soap. Pat the wound dry with a clean towel. Do not rub it. Follow instructions from your provider about how to take care of your wound. Make sure you: Wash your hands with soap and water for at least 20 seconds before and after you change your dressing. If soap and water are not available, use hand sanitizer. Change your dressing as told by your provider. Leave stitches (sutures), skin glue, or tape strips in place. These skin closures may need to stay in place for 2 weeks or longer. If tape strip edges start to loosen and curl up, you may trim the loose edges. Do not remove tape strips completely unless your provider tells you to do that. Check your wound every day for signs of infection. Check for: More redness, swelling, or pain. More fluid or blood. Warmth. Pus or a bad smell. General instructions Drink enough fluid to keep your pee (urine) pale yellow. Do not take baths, swim, or use a hot tub until your provider approves. Ask your provider if you may take showers. You may only be allowed to take sponge baths. Raise (elevate) the wound area above the level of your heart while you are sitting or lying down. Do not scratch or pick at the wound. Keep all follow-up visits. These visits help your provider make sure a more serious infection is not developing. Contact a health care provider if: Your infection does not get better in 24-48 hours.  You have signs of infection. You have a fever. Your wound gets larger, turns dark in color, or becomes more painful. You feel generally sick (malaise) with muscle aches and weakness. Your symptoms get worse. You have vomiting or diarrhea that does not stop. Get help right away if: Your wound that was closed breaks open. You see red streaks coming from the  infected area. This information is not intended to replace advice given to you by your health care provider. Make sure you discuss any questions you have with your health care provider. Document Revised: 11/15/2021 Document Reviewed: 11/15/2021 Elsevier Patient Education  2024 [employer].

## 2023-01-28 ENCOUNTER — Ambulatory Visit (INDEPENDENT_AMBULATORY_CARE_PROVIDER_SITE_OTHER): Payer: Medicare Other | Admitting: Surgical

## 2023-01-28 ENCOUNTER — Other Ambulatory Visit: Payer: Self-pay | Admitting: Internal Medicine

## 2023-01-28 ENCOUNTER — Other Ambulatory Visit: Payer: Self-pay | Admitting: Nurse Practitioner

## 2023-01-28 ENCOUNTER — Other Ambulatory Visit (INDEPENDENT_AMBULATORY_CARE_PROVIDER_SITE_OTHER): Payer: Medicare Other | Admitting: Pharmacist

## 2023-01-28 ENCOUNTER — Encounter: Payer: Self-pay | Admitting: Surgical

## 2023-01-28 VITALS — BP 110/73 | HR 61

## 2023-01-28 DIAGNOSIS — Z515 Encounter for palliative care: Secondary | ICD-10-CM | POA: Insufficient documentation

## 2023-01-28 DIAGNOSIS — E785 Hyperlipidemia, unspecified: Secondary | ICD-10-CM

## 2023-01-28 DIAGNOSIS — E1165 Type 2 diabetes mellitus with hyperglycemia: Secondary | ICD-10-CM

## 2023-01-28 DIAGNOSIS — S81802D Unspecified open wound, left lower leg, subsequent encounter: Secondary | ICD-10-CM | POA: Diagnosis not present

## 2023-01-28 MED ORDER — OXYCODONE HCL 10 MG PO TABS: 10.0000 mg | ORAL_TABLET | Freq: Three times a day (TID) | ORAL | 0 refills | Status: DC | PRN

## 2023-01-28 MED ORDER — ATORVASTATIN CALCIUM 80 MG PO TABS: 80.0000 mg | ORAL_TABLET | Freq: Every day | ORAL | 0 refills | Status: DC

## 2023-01-28 NOTE — Progress Notes (Signed)
   01/28/2023 Name: [name] MRN: [id number] DOB: 1965/09/06  Chief Complaint  Patient presents with   Dexcom request   Spoke to patient and her husband regarding request for Dexcom device.  Informed them that insurance will not cover a CGM device (neither Dexcom nor Freestyle Libre) as they require a patient to be on at least once daily insulin or have documented severe hypoglycemia <54.   Pt's husband has been checking BG by fingerstick and notes BG have been stable in the low 100s. Highest of 140 after eating. Last A1c 6.6% in October.   Recommended reducing frequency of BG checks to about 2x per week as BG do appear stable. If he begins seeing BG >150, check more frequently and let us know.  Arbutus Leas, PharmD, BCPS, CPP Clinical Pharmacist Practitioner [facility] [phone]

## 2023-01-28 NOTE — Progress Notes (Signed)
Referring Provider [name], MD [address] [place],  [place] [address]   CC:  Chief Complaint  Patient presents with   Follow-up      [name] is an 57 y.o. female.  HPI: The patient is a 57 year old female here for evaluation of her left leg wound.  She has a past medical history of postpartum cardiomyopathy, history of CHF with a EF 30 to 35%, DM, morbid obesity who presented to the [facility], ED on 12/05/2022 via EMS for cardiac arrest.  She was in the ICU and intubated, she did receive IO fluids which resulted in left lower extremity swelling and a significant left lower extremity wound with skin necrosis.  She has been following up in our clinic for management.  She is a poor surgical candidate for debridement in the operating room at this time due to her currently having a LifeVest.  She is a poor surgical candidate from a cardiac standpoint due to her leg wound as well.  She was seen by her PCP yesterday and mentioned her left leg wound and was instructed to follow-up with our office today over concern for infection.  She reports that she is continuing to have pain, it is unchanged.  She is not having any infectious symptoms today.  Her husband is here with her and they have been continuing with Santyl and Vaseline/Xeroform dressing changes daily.  They did not change the dressing yesterday as they were scared to do so.  Review of Systems General: No fevers or chills  Physical Exam    01/27/2023    3:09 PM 01/22/2023    4:43 PM 01/22/2023   11:53 AM  Vitals with BMI  Height 5\' 4"  5\' 4"    Weight 278 lbs 10 oz 282 lbs   BMI 47.8 48.38   Systolic 128 100 604  Diastolic 86 62 84  Pulse 98 91 101    General:  No acute distress,  Alert and oriented, Non-Toxic, Normal speech and affect Breathing is unlabored.  Patient is anxious.  Husband at bedside. Left leg wound: Patient has a significant left lower extremity wound extending from her distal thigh to her mid  shin.  She does have a thickened eschar completely covering the entirety of the wound.  The eschar is beginning to lift up along the medial lateral and superior edges.  She does have some new epithelialization noted at the wound edges.  She has some granulation tissue exposed medially laterally and superiorly with some fibrinous exudate noted within the wound bed.  There is no erythema or cellulitic changes surrounding the wound bed.  There is no signs of infection on exam.  There is a wound odor on exam, this odor seems normal given the extent of her wound.  There is no active purulent drainage.  The eschar is beginning to soften with palpation       Assessment/Plan Patient is a 57 year old female here for evaluation of her left lower extremity wound.  She is very anxious today, reports that she saw her PCP yesterday and was prescribed antibiotics and a wound culture was obtained.  She reports she has taken 1 dose of the antibiotics.  She is not having any infectious symptoms.  I discussed with the patient that the wound culture is not yet available, we did discuss that it would not be surprising if she had positive cultures given the extent of her wound and the amount of exposure and has had to  environment at this time.  I did discuss with her that I do not feel as if antibiotics are necessary at this time after evaluating the wound.   We discussed ongoing dressing changes with Santyl, Vaseline, Xeroform, ABDs, Kerlix and Ace wrap daily.  We thoroughly discussed the expected ongoing wound healing plan, we discussed that the wound will get worse before it gets better as the eschar begins to lift up and she notices more exposed granulation tissue.  We discussed that this exposed granulation tissue is very normal at this point and is expected.  I do not appreciate any overt signs of infection on exam.  We did discuss that if she notices worsening symptoms to please notify our office.    She is  encouraged to call with any other questions or concerns.  Pictures were taken and placed in the patient's chart with patient's permission.  A total of 30 minutes was spent caring for the patient today with greater than 75% of this time spent in face-to-face contact with the patient and her husband providing direct patient care, education.  Kermit Balo Darlen Gledhill 01/28/2023, 1:36 PM

## 2023-01-29 ENCOUNTER — Encounter: Payer: Self-pay | Admitting: Cardiology

## 2023-01-29 ENCOUNTER — Encounter: Payer: Self-pay | Admitting: Internal Medicine

## 2023-01-29 ENCOUNTER — Other Ambulatory Visit (HOSPITAL_COMMUNITY): Payer: Self-pay

## 2023-01-29 MED ORDER — METFORMIN HCL ER 500 MG PO TB24: 500.0000 mg | ORAL_TABLET | Freq: Two times a day (BID) | ORAL | 1 refills | Status: DC

## 2023-01-30 ENCOUNTER — Other Ambulatory Visit: Payer: Self-pay | Admitting: Student

## 2023-01-30 ENCOUNTER — Telehealth: Payer: Self-pay | Admitting: Internal Medicine

## 2023-01-30 ENCOUNTER — Other Ambulatory Visit: Payer: Self-pay | Admitting: Cardiology

## 2023-01-30 DIAGNOSIS — T8189XA Other complications of procedures, not elsewhere classified, initial encounter: Secondary | ICD-10-CM | POA: Diagnosis not present

## 2023-01-30 DIAGNOSIS — I5032 Chronic diastolic (congestive) heart failure: Secondary | ICD-10-CM | POA: Diagnosis not present

## 2023-01-30 DIAGNOSIS — I96 Gangrene, not elsewhere classified: Secondary | ICD-10-CM | POA: Diagnosis not present

## 2023-01-30 DIAGNOSIS — I5023 Acute on chronic systolic (congestive) heart failure: Secondary | ICD-10-CM | POA: Diagnosis not present

## 2023-01-30 DIAGNOSIS — T8089XA Other complications following infusion, transfusion and therapeutic injection, initial encounter: Secondary | ICD-10-CM | POA: Diagnosis not present

## 2023-01-30 DIAGNOSIS — I152 Hypertension secondary to endocrine disorders: Secondary | ICD-10-CM | POA: Diagnosis not present

## 2023-01-30 LAB — WOUND CULTURE

## 2023-01-30 MED ORDER — CIPROFLOXACIN HCL 500 MG PO TABS: 500.0000 mg | ORAL_TABLET | Freq: Two times a day (BID) | ORAL | 0 refills | Status: DC

## 2023-01-30 MED ORDER — BLOOD GLUCOSE MONITORING SUPPL DEVI: 1.0000 | Freq: Three times a day (TID) | 0 refills | Status: AC

## 2023-01-30 MED ORDER — METOPROLOL SUCCINATE ER 50 MG PO TB24: 50.0000 mg | ORAL_TABLET | Freq: Every day | ORAL | 1 refills | Status: DC

## 2023-01-30 MED ORDER — SPIRONOLACTONE 25 MG PO TABS: 12.5000 mg | ORAL_TABLET | Freq: Every day | ORAL | 1 refills | Status: DC

## 2023-01-30 MED ORDER — FUROSEMIDE 40 MG PO TABS: 40.0000 mg | ORAL_TABLET | Freq: Every day | ORAL | 1 refills | Status: DC

## 2023-01-30 MED ORDER — LOSARTAN POTASSIUM 25 MG PO TABS: 12.5000 mg | ORAL_TABLET | Freq: Every day | ORAL | 1 refills | Status: DC

## 2023-01-30 MED ORDER — MAGNESIUM OXIDE 400 MG PO TABS: 400.0000 mg | ORAL_TABLET | Freq: Every day | ORAL | 1 refills | Status: AC

## 2023-01-30 MED ORDER — POTASSIUM CHLORIDE CRYS ER 20 MEQ PO TBCR: 20.0000 meq | EXTENDED_RELEASE_TABLET | Freq: Every day | ORAL | 1 refills | Status: DC

## 2023-01-30 MED ORDER — DAPAGLIFLOZIN PROPANEDIOL 10 MG PO TABS: 10.0000 mg | ORAL_TABLET | Freq: Every day | ORAL | 1 refills | Status: DC

## 2023-01-30 NOTE — Telephone Encounter (Signed)
Patient was seen by Dr. Yetta Barre on 01/27/23 and said she forgot to mention that she thinks she has a UTI. She would like to know what Dr. Yetta Barre would like for her to do. Best callback is [phone].

## 2023-01-30 NOTE — Progress Notes (Signed)
Patient has been reached out via MyChart in regards to wound culture that was taken by PCP on 01/27/2023.  Culture has grown out Pseudomonas, sensitive to ciprofloxacin.  Discussed with the patient that I am going to start her on ciprofloxacin.  Told her to hold all other antibiotics.  She states that she has been holding all other antibiotics at this time.  Discussed with her that per no from her office on Monday, wound did not look overtly infected, but we will place her on antibiotics given positive culture.  Patient expressed understanding.

## 2023-01-30 NOTE — Telephone Encounter (Signed)
Were going to need her to come in and give [place] a urine sample correct ?

## 2023-01-30 NOTE — Telephone Encounter (Signed)
Patient states that you gave her an antibiotic for her leg not the possible UTI. Then the surgeon advised her not to take the antibiotic for her leg. She is having itching burning discomfort and pain with urination. She is willing to come in and give [place] a urine sample if need be. But she's requesting something for it.

## 2023-01-30 NOTE — Telephone Encounter (Signed)
Called and spoke with patient but she wanted me to speak with her husband because he handles her medication. Husband stated that she was prescribed cardiac medications while in rehab and then they saw Dr.Hochrein and was not given any more,stated that they needed refills on all cardiac medications. Refills of medication was sent into the pharmacy.  Medications sent in: Farxiga, lasix,Cozaar, Mag-ox, Metoprolol succinate, potassium chloride and spironolactone. Patient verbalized understanding.

## 2023-01-31 ENCOUNTER — Telehealth: Payer: Self-pay

## 2023-01-31 ENCOUNTER — Other Ambulatory Visit (INDEPENDENT_AMBULATORY_CARE_PROVIDER_SITE_OTHER): Payer: Medicare Other

## 2023-01-31 DIAGNOSIS — E1159 Type 2 diabetes mellitus with other circulatory complications: Secondary | ICD-10-CM | POA: Diagnosis not present

## 2023-01-31 DIAGNOSIS — I96 Gangrene, not elsewhere classified: Secondary | ICD-10-CM | POA: Diagnosis not present

## 2023-01-31 DIAGNOSIS — R300 Dysuria: Secondary | ICD-10-CM

## 2023-01-31 DIAGNOSIS — T8189XA Other complications of procedures, not elsewhere classified, initial encounter: Secondary | ICD-10-CM | POA: Diagnosis not present

## 2023-01-31 DIAGNOSIS — T8089XA Other complications following infusion, transfusion and therapeutic injection, initial encounter: Secondary | ICD-10-CM | POA: Diagnosis not present

## 2023-01-31 DIAGNOSIS — Z7984 Long term (current) use of oral hypoglycemic drugs: Secondary | ICD-10-CM | POA: Diagnosis not present

## 2023-01-31 DIAGNOSIS — I152 Hypertension secondary to endocrine disorders: Secondary | ICD-10-CM | POA: Diagnosis not present

## 2023-01-31 DIAGNOSIS — I5023 Acute on chronic systolic (congestive) heart failure: Secondary | ICD-10-CM | POA: Diagnosis not present

## 2023-01-31 DIAGNOSIS — E785 Hyperlipidemia, unspecified: Secondary | ICD-10-CM | POA: Diagnosis not present

## 2023-01-31 DIAGNOSIS — Z8674 Personal history of sudden cardiac arrest: Secondary | ICD-10-CM | POA: Diagnosis not present

## 2023-01-31 DIAGNOSIS — I5032 Chronic diastolic (congestive) heart failure: Secondary | ICD-10-CM | POA: Diagnosis not present

## 2023-01-31 DIAGNOSIS — F418 Other specified anxiety disorders: Secondary | ICD-10-CM | POA: Diagnosis not present

## 2023-01-31 DIAGNOSIS — Z87891 Personal history of nicotine dependence: Secondary | ICD-10-CM | POA: Diagnosis not present

## 2023-01-31 DIAGNOSIS — I959 Hypotension, unspecified: Secondary | ICD-10-CM | POA: Diagnosis not present

## 2023-01-31 DIAGNOSIS — E1122 Type 2 diabetes mellitus with diabetic chronic kidney disease: Secondary | ICD-10-CM | POA: Diagnosis not present

## 2023-01-31 DIAGNOSIS — E1169 Type 2 diabetes mellitus with other specified complication: Secondary | ICD-10-CM | POA: Diagnosis not present

## 2023-01-31 DIAGNOSIS — N1831 Chronic kidney disease, stage 3a: Secondary | ICD-10-CM | POA: Diagnosis not present

## 2023-01-31 DIAGNOSIS — I080 Rheumatic disorders of both mitral and aortic valves: Secondary | ICD-10-CM | POA: Diagnosis not present

## 2023-01-31 DIAGNOSIS — E1165 Type 2 diabetes mellitus with hyperglycemia: Secondary | ICD-10-CM | POA: Diagnosis not present

## 2023-01-31 DIAGNOSIS — M48061 Spinal stenosis, lumbar region without neurogenic claudication: Secondary | ICD-10-CM | POA: Diagnosis not present

## 2023-01-31 DIAGNOSIS — Z6841 Body Mass Index (BMI) 40.0 and over, adult: Secondary | ICD-10-CM | POA: Diagnosis not present

## 2023-01-31 DIAGNOSIS — Z7985 Long-term (current) use of injectable non-insulin antidiabetic drugs: Secondary | ICD-10-CM | POA: Diagnosis not present

## 2023-01-31 LAB — URINALYSIS, ROUTINE W REFLEX MICROSCOPIC
Bilirubin Urine: NEGATIVE
Hgb urine dipstick: NEGATIVE
Ketones, ur: NEGATIVE
Leukocytes,Ua: NEGATIVE
Nitrite: NEGATIVE
Specific Gravity, Urine: 1.02 (ref 1.000–1.030)
Total Protein, Urine: NEGATIVE
Urine Glucose: >=1000 — AB
Urobilinogen, UA: 0.2 (ref 0.0–1.0)
pH: 5.5 (ref 5.0–8.0)

## 2023-01-31 NOTE — Progress Notes (Unsigned)
   Care Guide Note  01/31/2023 Name: Aamiya Mullikin MRN: [id number] DOB: April 07, 1965  Referred by: [name], MD Reason for referral : Care Coordination (Outreach to schedule with Pharm d )   Lind Jervis is a 57 y.o. year old female who is a primary care patient of [name], MD. [name] was referred to the pharmacist for assistance related to  polypharmacy  .    An unsuccessful telephone outreach was attempted today to contact the patient who was referred to the pharmacy team for assistance with medication management. Additional attempts will be made to contact the patient.   Penne Lash , RMA     [facility]  [facility], [facility] Guide  Direct Dial: [phone]  Website: [email or web address]

## 2023-01-31 NOTE — Telephone Encounter (Signed)
Patient is aware and gave a verbal understanding.

## 2023-02-03 ENCOUNTER — Telehealth: Payer: Self-pay | Admitting: Podiatry

## 2023-02-03 DIAGNOSIS — I152 Hypertension secondary to endocrine disorders: Secondary | ICD-10-CM | POA: Diagnosis not present

## 2023-02-03 DIAGNOSIS — I5032 Chronic diastolic (congestive) heart failure: Secondary | ICD-10-CM | POA: Diagnosis not present

## 2023-02-03 DIAGNOSIS — I96 Gangrene, not elsewhere classified: Secondary | ICD-10-CM | POA: Diagnosis not present

## 2023-02-03 DIAGNOSIS — I5023 Acute on chronic systolic (congestive) heart failure: Secondary | ICD-10-CM | POA: Diagnosis not present

## 2023-02-03 DIAGNOSIS — T8089XA Other complications following infusion, transfusion and therapeutic injection, initial encounter: Secondary | ICD-10-CM | POA: Diagnosis not present

## 2023-02-03 DIAGNOSIS — T8189XA Other complications of procedures, not elsewhere classified, initial encounter: Secondary | ICD-10-CM | POA: Diagnosis not present

## 2023-02-03 LAB — CULTURE, URINE COMPREHENSIVE

## 2023-02-03 MED ORDER — KETOCONAZOLE 2 % EX CREA: 1.0000 | TOPICAL_CREAM | Freq: Every day | CUTANEOUS | 2 refills | Status: DC

## 2023-02-03 NOTE — Telephone Encounter (Signed)
Patient stated she is just now being able to return the call she has missed from our office in regards to a nail culture test. She canceled her appointment that is on scheduled for tomorrow only because she wasn't able to get the medication to apply to her foot and would have no update she also has an operation tomorrow for the open wound on her leg and would be unable to make the appointment anyway. Once the medication is placed in the system and sent to the [facility] she said she would call back and make a follow up appointment.She still has some questions and concerns about her results and would like for someone to call her back.  Thanks

## 2023-02-03 NOTE — Telephone Encounter (Signed)
Finally got a hold of the patient to discuss fungal culture results from skin scrapings.  It was positive for fungus.  She has been in/out of the hospital for complex issues, so will avoid oral medication due to the other medications and antibiotics she's been on.  She understood.  Will send in Ketoconazole cream to her pharmacy now.  She needs to make a f/u with me in 2-3 months for f/u of athlete's foot.  Will forward this message to our scheduling office so that they can reach out to patient to make this appointment.

## 2023-02-03 NOTE — Progress Notes (Signed)
   Care Guide Note  02/03/2023 Name: [name] MRN: [id number] DOB: [birth year]  Referred by: [name], MD Reason for referral : Care Coordination (Outreach to schedule with Pharm d )   [name] is a 57 y.o. year old female who is a primary care patient of [name], MD. [name] was referred to the pharmacist for assistance related to  polypharmay  .    Successful contact was made with the patient to discuss pharmacy services including being ready for the pharmacist to call at least 5 minutes before the scheduled appointment time, to have medication bottles and any blood sugar or blood pressure readings ready for review. The patient agreed to meet with the pharmacist via with the pharmacist via telephone visit on (date/time).  02/14/2023  Penne Lash , RMA     [place]  [facility], [facility] Guide  Direct Dial: [phone]  Website: [email or web address]

## 2023-02-04 ENCOUNTER — Encounter: Payer: Self-pay | Admitting: Surgical

## 2023-02-04 ENCOUNTER — Ambulatory Visit (INDEPENDENT_AMBULATORY_CARE_PROVIDER_SITE_OTHER): Payer: Medicare Other | Admitting: Surgical

## 2023-02-04 ENCOUNTER — Encounter (HOSPITAL_COMMUNITY): Payer: Medicare Other | Admitting: Physician Assistant

## 2023-02-04 ENCOUNTER — Other Ambulatory Visit: Payer: Self-pay

## 2023-02-04 ENCOUNTER — Ambulatory Visit: Payer: Medicare Other | Admitting: Podiatry

## 2023-02-04 VITALS — BP 120/79 | HR 110 | Wt 273.8 lb

## 2023-02-04 DIAGNOSIS — S81802D Unspecified open wound, left lower leg, subsequent encounter: Secondary | ICD-10-CM | POA: Diagnosis not present

## 2023-02-04 NOTE — Addendum Note (Signed)
Addended byKeenan Bachelor on: 02/04/2023 11:28 AM   Modules accepted: Orders

## 2023-02-04 NOTE — H&P (View-Only) (Signed)
Referring Provider [name], MD [address] [place],  [place] [address]   CC:  Chief Complaint  Patient presents with   Follow-up      [name] is an 57 y.o. female.  HPI: Patient is a 57 year old female here for follow-up on her left lower extremity wound.  She presented to the [facility], ED on 12/05/2022 via EMS for cardiac arrest, she was in the ICU and intubated.  She developed a significant left lower extremity wound due to IO fluids in the field with resulting cellulitis and full-thickness wound.  She has had a thick eschar over this area which has been slowly unroofing itself.  Of note, she does have a history of postpartum cardiomyopathy with history of CHF with EF 30 to 35%.  She has been a poor surgical candidate for debridement in the operating room due to her currently having a LifeVest.  She is unable to get an ICD due to her full-thickness wound.  Per cardiology note: she did have a cardiac catheterization as part of her evaluation which showed normal coronary arteries, MRI with EF to 31%.  Echocardiogram with a EF 30 to 35%.  She presents today with her husband for evaluation.  She reports that she presents earlier than her previously scheduled appointment due to concerns over drainage.  She is not having any infectious symptoms today.  She is on ciprofloxacin prescribed by our office on 01/30/2023.  She reports she also has a UTI which is being managed by her PCP.  Her husband has questions today about which antibiotic they should be taking for the UTI.  He shows me a MyChart message in relation to this with questions about which antibiotic to take.  Patient reports he has been tolerating the antibiotics well without any issues.  She reports pain has been consistent.  She reports that she is very anxious about the wound. She does have difficulty ambulating due to the left leg wound, she reports that she is having back pain related to altering her gait.  She  also feels as if her right leg is beginning to bother her due to altered gait.  She reports she does have an orthopedic doctor, but reports she does not want to see any more doctors at this time.  Review of Systems General: No fevers or chills  Physical Exam    02/04/2023   10:30 AM 01/28/2023    2:15 PM 01/27/2023    3:09 PM  Vitals with BMI  Height   5\' 4"   Weight 273 lbs 13 oz  278 lbs 10 oz  BMI 46.97  47.8  Systolic 120 110 295  Diastolic 79 73 86  Pulse 110 61 98    General:  No acute distress,  Alert and oriented, Non-Toxic, Normal speech and affect Left leg wound: Left lower extremity wound extending from distal thigh to mid shin.  The entire wound is approximately 20 x 17 cm.  She does have a thick eschar present over the wound, this is beginning to unroofed.  Thick eschar was completely debrided today in the clinic, she does have an exposed patellar tendon, approximately 4 x 2.5 cm of the tendon is exposed. No surrounding erythema or cellulitic changes.  Foul odor is noted.  No active purulent drainage noted.  Majority of the wound bed is fibrinous slough.    Assessment/Plan Left lower extremity wound:  57 year old female with a significant left lower extremity wound.  We were able  to completely debride the left leg wound eschar and unroofed it.  The total area debrided was 20 x 17 cm.  Debridement was completed with sterile pickups and scissors.  Patient overall tolerated this well.  Due to the exposed patellar tendon, discussed patient's case with Dr. Ladona Ridgel.  We will plan to take her to the operating room this week for debridement of the left leg wound, application of myriad wound matrix.  Discussed patient's case with cardiology via secure chat to notify them of need for surgery given the exposed tendon.  Reached out to Dr. [name] with cardiology.  Appreciate his assistance.  Recommend continue with antibiotics.  Addendum: We were planning to switch antibiotics  from ciprofloxacin to another antibiotic regimen due to the risk for tendon rupture, however patient is scheduled to see infectious disease in 2 days on 02/06/2023.  She is also on cefdinir for a UTI.  To avoid too many changes to her antibiotic regimen at this time, will leave patient on ciprofloxacin.  Will also send patient for urgent consult to infectious disease for their opinion on antibiotic treatment.  Appreciate their assistance.  Wound care: Recommend alginate dressings to the entirety of the wound with the exception of the exposed tendon.  Recommend keeping the exposed tendon moist with Adaptic and K-Y jelly to prevent desiccation.  Recommend covering this with ABD pads, Kerlix, Ace wrap.  Recommend changing this daily.  Patient is receiving home health assistance at this time twice per week, I do think they would benefit from increased visits up to 4 times per week due to the size of the wound and complexity of the wound.   Patient's physical exam findings and case discussed with Dr. Ladona Ridgel, will proceed with debridement and application of wound matrix in operating room this week if possible.  Appreciate cardiology's assistance.  Pictures were obtained of the patient and placed in the chart with the patient's or guardian's permission.  Greater than 25 minutes was spent providing patient care, greater than 50% of this time was spent face-to-face with patient and her husband.  This also includes time spent coordinating care, placing referrals, discussing patient's case with other consulting providers.  Addendum: Spoke with patient's husband after appointment today, discussed plan moving forward.  We are waiting to hear from insurance for urgent authorization.  Discussed with patient's husband that we received the okay from cardiologist.  All of his questions were answered to his content.  We confirmed appointment with infectious disease at 1 PM on 02/06/23  Kermit Balo [name] 02/04/2023, 10:46  AM

## 2023-02-04 NOTE — Progress Notes (Cosign Needed Addendum)
Referring Provider Samantha Grandchild, MD [address] [place],  [place] [address]   CC:  Chief Complaint  Patient presents with   Follow-up      [name] is an 57 y.o. female.  HPI: Patient is a 57 year old female here for follow-up on her left lower extremity wound.  She presented to the [facility], ED on 12/05/2022 via EMS for cardiac arrest, she was in the ICU and intubated.  She developed a significant left lower extremity wound due to IO fluids in the field with resulting cellulitis and full-thickness wound.  She has had a thick eschar over this area which has been slowly unroofing itself.  Of note, she does have a history of postpartum cardiomyopathy with history of CHF with EF 30 to 35%.  She has been a poor surgical candidate for debridement in the operating room due to her currently having a LifeVest.  She is unable to get an ICD due to her full-thickness wound.  Per cardiology note: she did have a cardiac catheterization as part of her evaluation which showed normal coronary arteries, MRI with EF to 31%.  Echocardiogram with a EF 30 to 35%.  She presents today with her husband for evaluation.  She reports that she presents earlier than her previously scheduled appointment due to concerns over drainage.  She is not having any infectious symptoms today.  She is on ciprofloxacin prescribed by our office on 01/30/2023.  She reports she also has a UTI which is being managed by her PCP.  Her husband has questions today about which antibiotic they should be taking for the UTI.  He shows me a MyChart message in relation to this with questions about which antibiotic to take.  Patient reports he has been tolerating the antibiotics well without any issues.  She reports pain has been consistent.  She reports that she is very anxious about the wound. She does have difficulty ambulating due to the left leg wound, she reports that she is having back pain related to altering her gait.  She  also feels as if her right leg is beginning to bother her due to altered gait.  She reports she does have an orthopedic doctor, but reports she does not want to see any more doctors at this time.  Review of Systems General: No fevers or chills  Physical Exam    02/04/2023   10:30 AM 01/28/2023    2:15 PM 01/27/2023    3:09 PM  Vitals with BMI  Height   5\' 4"   Weight 273 lbs 13 oz  278 lbs 10 oz  BMI 46.97  47.8  Systolic 120 110 295  Diastolic 79 73 86  Pulse 110 61 98    General:  No acute distress,  Alert and oriented, Non-Toxic, Normal speech and affect Left leg wound: Left lower extremity wound extending from distal thigh to mid shin.  The entire wound is approximately 20 x 17 cm.  She does have a thick eschar present over the wound, this is beginning to unroofed.  Thick eschar was completely debrided today in the clinic, she does have an exposed patellar tendon, approximately 4 x 2.5 cm of the tendon is exposed. No surrounding erythema or cellulitic changes.  Foul odor is noted.  No active purulent drainage noted.  Majority of the wound bed is fibrinous slough.    Assessment/Plan Left lower extremity wound:  57 year old female with a significant left lower extremity wound.  We were able  to completely debride the left leg wound eschar and unroofed it.  The total area debrided was 20 x 17 cm.  Debridement was completed with sterile pickups and scissors.  Patient overall tolerated this well.  Due to the exposed patellar tendon, discussed patient's case with Dr. Ladona [name].  We will plan to take her to the operating room this week for debridement of the left leg wound, application of myriad wound matrix.  Discussed patient's case with cardiology via secure chat to notify them of need for surgery given the exposed tendon.  Reached out to Dr. Rollene [name] with cardiology.  Appreciate his assistance.  Recommend continue with antibiotics.  Addendum: We were planning to switch antibiotics  from ciprofloxacin to another antibiotic regimen due to the risk for tendon rupture, however patient is scheduled to see infectious disease in 2 days on 02/06/2023.  She is also on cefdinir for a UTI.  To avoid too many changes to her antibiotic regimen at this time, will leave patient on ciprofloxacin.  Will also send patient for urgent consult to infectious disease for their opinion on antibiotic treatment.  Appreciate their assistance.  Wound care: Recommend alginate dressings to the entirety of the wound with the exception of the exposed tendon.  Recommend keeping the exposed tendon moist with Adaptic and K-Y jelly to prevent desiccation.  Recommend covering this with ABD pads, Kerlix, Ace wrap.  Recommend changing this daily.  Patient is receiving home health assistance at this time twice per week, I do think they would benefit from increased visits up to 4 times per week due to the size of the wound and complexity of the wound.   Patient's physical exam findings and case discussed with Dr. Ladona [name], will proceed with debridement and application of wound matrix in operating room this week if possible.  Appreciate cardiology's assistance.  Pictures were obtained of the patient and placed in the chart with the patient's or guardian's permission.  Greater than 25 minutes was spent providing patient care, greater than 50% of this time was spent face-to-face with patient and her husband.  This also includes time spent coordinating care, placing referrals, discussing patient's case with other consulting providers.  Addendum: Spoke with patient's husband after appointment today, discussed plan moving forward.  We are waiting to hear from insurance for urgent authorization.  Discussed with patient's husband that we received the okay from cardiologist.  All of his questions were answered to his content.  We confirmed appointment with infectious disease at 1 PM on 02/06/23  [name] [name] 02/04/2023, 10:46  AM

## 2023-02-05 ENCOUNTER — Encounter (HOSPITAL_COMMUNITY): Payer: Self-pay | Admitting: Plastic Surgery

## 2023-02-05 ENCOUNTER — Telehealth: Payer: Self-pay | Admitting: Internal Medicine

## 2023-02-05 ENCOUNTER — Telehealth (HOSPITAL_COMMUNITY): Payer: Self-pay | Admitting: Physician Assistant

## 2023-02-05 DIAGNOSIS — I152 Hypertension secondary to endocrine disorders: Secondary | ICD-10-CM | POA: Diagnosis not present

## 2023-02-05 DIAGNOSIS — T8189XA Other complications of procedures, not elsewhere classified, initial encounter: Secondary | ICD-10-CM | POA: Diagnosis not present

## 2023-02-05 DIAGNOSIS — T8089XA Other complications following infusion, transfusion and therapeutic injection, initial encounter: Secondary | ICD-10-CM | POA: Diagnosis not present

## 2023-02-05 DIAGNOSIS — I96 Gangrene, not elsewhere classified: Secondary | ICD-10-CM | POA: Diagnosis not present

## 2023-02-05 DIAGNOSIS — I5032 Chronic diastolic (congestive) heart failure: Secondary | ICD-10-CM | POA: Diagnosis not present

## 2023-02-05 DIAGNOSIS — I5023 Acute on chronic systolic (congestive) heart failure: Secondary | ICD-10-CM | POA: Diagnosis not present

## 2023-02-05 NOTE — Anesthesia Preprocedure Evaluation (Signed)
Anesthesia Evaluation  Patient identified by MRN, date of birth, ID band Patient awake    Reviewed: Allergy & Precautions, NPO status , Patient's Chart, lab work & pertinent test results, reviewed documented beta blocker date and time   History of Anesthesia Complications Negative for: history of anesthetic complications  Airway Mallampati: III  TM Distance: >3 FB Neck ROM: Full    Dental  (+) Dental Advisory Given Denies loose teeth.:   Pulmonary neg shortness of breath, sleep apnea (unable to tolerate CPAP) , neg COPD, neg recent URI, former smoker   Pulmonary exam normal breath sounds clear to auscultation       Cardiovascular hypertension, Pt. on medications and Pt. on home beta blockers (-) angina +CHF (LVEF 30-35%)  (-) Past MI, (-) Cardiac Stents and (-) CABG  Rhythm:Regular Rate:Normal  [facility] [place] admission 12/05/22 - 12/17/22 for out of hospital witnessed cardiac arrest. Husband called EMS and started CPR. Found to be in VT/VF and ACLS initiated. ROSC after 40 minutes, total of shocks x6, epinephrine x9, 300 mg amiodarone, 2 gm of magnesium. Intubated on arrival to ED. IV access obtained with use of [name] IO. 12/07/22 echo showed unchanged LVEF 30-35%, diffuse LV hypokinesis, mild LVH, normal RV systolic function, trivial MR. RHC/LHC on 12/09/22 showed normal coronaries, mildly elevated PA pressure with elevated EDP, non-ischemic cardiomyopathy. Cardiac MRI on 12/12/22 showed EF 31%, RV insertion site late gadolinium enhancement with non-specific scar pattern that can be seen in setting of elevated pulmonary pressures. She developed [name] wound at site of IO placement.  Using LifeVest since she is not a candidate for ICD until [name] wound healed. Patient has not been shocked by the LifeVest.   RHC/LHC 12/09/22: mpression and recommendations: Findings consistent with nonischemic cardiomyopathy.  Mildly elevated PA pressure with  elevated EDP.   Echo 12/07/22: IMPRESSIONS   1. Left ventricular ejection fraction, by estimation, is 30 to 35%. The  left ventricle has moderately decreased function. The left ventricle  demonstrates global hypokinesis. There is mild left ventricular  hypertrophy. Indeterminate diastolic filling due   to E-A fusion.   2. Right ventricular systolic function is normal. The right ventricular  size is normal.   3. The mitral valve is normal in structure. Trivial mitral valve  regurgitation.   4. The aortic valve is tricuspid. Aortic valve regurgitation is not  visualized.   5. The inferior vena cava IVC not well visualized.  - Comparison(s): No significant change from prior study.     Neuro/Psych  Headaches, neg Seizures PSYCHIATRIC DISORDERS Anxiety Depression       GI/Hepatic negative GI ROS, Neg liver ROS,,,  Endo/Other  diabetes, Well Controlled, Type 2, Oral Hypoglycemic Agents  Class 3 obesityBMI 47  Renal/GU negative Renal ROS  negative genitourinary   Musculoskeletal  (+) Arthritis , Osteoarthritis,    Abdominal  (+) + obese  Peds  Hematology negative hematology ROS (+)   Anesthesia Other Findings [name] wound  Reproductive/Obstetrics negative OB ROS                             Anesthesia Physical Anesthesia Plan  ASA: 4  Anesthesia Plan: General   Post-op Pain Management: Tylenol PO (pre-op)*   Induction: Intravenous  PONV Risk Score and Plan: Ondansetron, Dexamethasone and Treatment may vary due to age or medical condition  Airway Management Planned: LMA  Additional Equipment:   Intra-op Plan:   Post-operative Plan: Extubation in  OR  Informed Consent: I have reviewed the patients History and Physical, chart, labs and discussed the procedure including the risks, benefits and alternatives for the proposed anesthesia with the patient or authorized representative who has indicated his/her understanding and acceptance.      Dental advisory given  Plan Discussed with: CRNA and Anesthesiologist  Anesthesia Plan Comments: (Risks of general anesthesia discussed including, but not limited to, sore throat, hoarse voice, chipped/damaged teeth, injury to vocal cords, nausea and vomiting, allergic reactions, lung infection, heart attack, stroke, and death. All questions answered. )       Anesthesia Quick Evaluation

## 2023-02-05 NOTE — Progress Notes (Addendum)
SDW call  Patient's husband [name] was given pre-op instructions over the phone. He verbalized understanding of instructions provided.   Patient wears a Life vest after cardiac arrest 12/05/2022.    PCP - Dr. Sanda Linger Cardiologist - Dr. [name] Pulmonary:    PPM/ICD - denies Device Orders - na Rep Notified - na   Chest x-ray - 12/10/2022 EKG -  01/14/2023 Stress Test -09/25/2017 ECHO - 12/07/2022 Cardiac Cath - 12/14/2022  Sleep Study/sleep apnea/CPAP: OSA, cannot tolerate wearing a CPAP  Type II diabetic Fasting Blood sugar range: less than 130 How often check sugars: three times a day.  Marcelline Deist, last dose 02/05/2023 Metformin, instructed to hold morning of surgery   Blood Thinner Instructions: denies Aspirin Instructions:denies   ERAS Protcol - Clears until 1230   COVID TEST- na    Anesthesia review: Yes.  HTN, DM, recent cardiac arrest, wears life vest, OSA,    Patient denies shortness of breath, fever, cough and chest pain over the phone call  Your procedure is scheduled on Thursday February 07, 2023  Report to [facility] Main Entrance "A" at 1300 PM, then check in with the Admitting office.  Call this number if you have problems the morning of surgery:  [phone]   If you have any questions prior to your surgery date call [phone]: Open Monday-Friday 8am-4pm If you experience any cold or flu symptoms such as cough, fever, chills, shortness of breath, etc. between now and your scheduled surgery, please notify us at the above number    Remember:  Do not eat after midnight the night before your surgery  You may drink clear liquids until 1230 the day of your surgery.   Clear liquids allowed are: Water, Non-Citrus Juices (without pulp), Carbonated Beverages, Clear Tea, Black Coffee ONLY (NO MILK, CREAM OR POWDERED CREAMER of any kind), and Gatorade   Take these medicines the morning of surgery with A SIP OF WATER:  Atorvastatin, metoprolol, cipro,  omnicef  As needed: Tylenol, xanax, flonase, oxycodone.   As of today, STOP taking any Aspirin (unless otherwise instructed by your surgeon) Aleve, Naproxen, Ibuprofen, Motrin, Advil, Goody's, BC's, all herbal medications, fish oil, and all vitamins.

## 2023-02-05 NOTE — Progress Notes (Signed)
Anesthesia Chart Review: [name]  Case: [id number] Date/Time: 02/06/23 1515   Procedure: debridement of left leg wound, application of myriad (Left)   Anesthesia type: Choice   Pre-op diagnosis: Wound of left lower extremity, subsequent encounter   [facility] ROOM 18 / [facility]   Surgeons: [name], MD       DISCUSSION: Patient is a 57 year old female scheduled for the above procedure.  History includes former smoker (quit 02/25/97), HTN, nonischemic cardiomyopathy (diagnosed post-partum 2003), DM2, OSA (intolerant of CPAP), HLD, fibromyalgia, morbid obesity.   Plastic surgeon Dr. Ladona Ridgel evaluated during her admission for [name] wound with thick eschar extending from the knee to the mid pretibial region. Wound was at the site where she received IO fluids by EMS during cardiac arrest resuscitation. The wound did not appear infected at that point, but given recent cardiac arrest, recommendation to keep eschar in place and allow wound to begin to granulate and reepithelialize. If worsening wound or concern for infection then surgical debridement would be reconsidered. 01/27/23 [name] wound culture + Pseudomonas aeruginosa, started on Ciprofloxacin by plastic surgery. 01/31/23 urine cultures + E. Coli, resistant to Cipro but not felt to need an antibiotic per her PCP Dr. Yetta Barre. She is currently on cefdinir.   Last follow-up with plastic surgery on 02/04/23 with Scheeler, Molli Hazard, PA-C. [name] wound debrided in the office and large eschar removed. Total area debrided was 20 x 17 cm. There was exposed patellar tendon, so was of debridement of the left leg in the operating room with application of Myriad wound Matrix recommended. Mr. Noe Gens documented he reached out to Dr. [name] and "received the okay from cardiologist "  She had EP evaluation with [name],[name], PA-C on 01/15/23 for follow-up non-ischemic CM with HFrEF and witnessed cardiac arrest 12/05/22. CPR/ACLS x 40 minutes with  shocks x6 and ROSC. ICD not placed due to extensive [name] wound. "The risks of a transvenous device outweigh the benefits at this juncture, with significant risk or infection given ongoing leg wound." Continued Life Vest planned for now. Follow-up with Dr. [name] in 3 months.  Evaluated at the [facility] by Tonye Becket, NP on 01/14/23. Volume status stable. Unable to titrate GDMT due to some low BPs and occasional lightheadedness. Contiue with Life vest. Repeat echo in 3-4 months.   Last cardiology visit with primary cardiologist Dr. [name] was on 01/22/23. Evaluate in one months to see if able to further titrate medication. Eventually will need ICD for secondary prevention, but continue Life vest for now until [name] wound improved.  [facility] [place] admission 12/05/22 - 12/17/22 for out of hospital witnessed cardiac arrest. Husband called EMS and started CPR. Found to be in VT/VF and ACLS initiated. ROSC after 40 minutes, total of shocks x6, epinephrine x9, 300 mg amiodarone, 2 gm of magnesium. Intubated on arrival to ED. IV access obtained with use of [name] IO. 12/07/22 echo showed unchanged LVEF 30-35%, diffuse LV hypokinesis, mild LVH, normal RV systolic function, trivial MR. RHC/LHC on 12/09/22 showed normal coronaries, mildly elevated PA pressure with elevated EDP, non-ischemic cardiomyopathy. Cardiac MRI on 12/12/22 showed EF 31%, RV insertion site late gadolinium enhancement with non-specific scar pattern that can be seen in setting of elevated pulmonary pressures. She developed [name] wound at site of IO placement. Orthopedics and plastic surgery consulted. As above, local wound care recommended unless worsening or infection. She was discharged to [facility] (12/17/22 - 12/30/22) for rehab therapy on GDMT and a LifeVest.  Anesthesia team to evaluate on the day of surgery. Last Fargixa 02/05/23.     VS:  Wt Readings from Last 3 Encounters:  02/04/23 124.2 kg  01/27/23 126.4 kg  01/22/23 127.9 kg   BP  Readings from Last 3 Encounters:  02/04/23 120/79  01/28/23 110/73  01/27/23 128/86   Pulse Readings from Last 3 Encounters:  02/04/23 (!) 110  01/28/23 61  01/27/23 98     PROVIDERS: [name], MD is PCP  [name], MD is primary cardiologist Tonye Becket, NP is HF Provider [name], Will, MD is EP cardiologist   LABS: Most recent lab results in [facility] include: Lab Results  Component Value Date   WBC 12.4 (H) 01/15/2023   HGB 13.8 01/15/2023   HCT 42.2 01/15/2023   PLT 423.0 (H) 01/15/2023   GLUCOSE 118 (H) 01/14/2023   ALT 26 12/18/2022   AST 23 12/18/2022   NA 138 01/14/2023   K 4.2 01/14/2023   CL 102 01/14/2023   CREATININE 1.04 (H) 01/14/2023   BUN 23 (H) 01/14/2023   CO2 24 01/14/2023   TSH 4.29 01/15/2023   INR 1.2 02/25/2022   HGBA1C 6.6 (H) 12/07/2022   MICROALBUR 1.3 03/06/2022    IMAGES: Xray C-spine 12/21/22: IMPRESSION: 1. Multilevel cervical spondylosis and facet hypertrophy. 2. No acute bony abnormalities.  1V CXR 12/10/22: FINDINGS: Patient is rotated. Endotracheal and enteric tubes have been removed. Left subclavian central line remains in place. Cardiac silhouette remains mildly enlarged. Improving aeration of the lung bases with mild residual atelectasis. No pleural effusion or pneumothorax. IMPRESSION: Improving aeration of the lung bases with mild residual atelectasis.  CTA Chest 12/05/22: IMPRESSION: 1. No evidence of pulmonary embolism. 2. Bilateral upper lobe atelectasis. Pneumonia is considered less likely though difficult to exclude.     EKG: 01/14/23: Normal sinus rhythm Low voltage QRS Confirmed by Steffanie Dunn [phone]) on 01/14/2023 9:24:06 PM   CV: Cardiac MRI 12/12/22: IMPRESSION: 1. Normal LV size, no hypertrophy, and moderate systolic dysfunction (EF 31%) 2.  Normal RV size and systolic function (EF 50%) 3. RV insertion site late gadolinium enhancement, which is a nonspecific scar pattern often  seen in setting of elevated pulmonary pressures   Right & Left Heart Catheterization 12/09/22: Hemodynamic data: LV: 119/7, EDP 17 mmHg.  Ao 110/78, mean 92 mmHg.  No pressure gradient across the aortic valve. RA: 15/13, mean 8 mmHg. RV 47/6, EDP 17 mmHg. PA 46/21, mean 31 mmHg.  PA saturation 66%. PW 18/19, mean 19 mmHg.  AO saturation 92%. CO 6.69, CI 2.83, preserved and normal.  QP/QS 1.00.   Angiographic data: RCA: Dominant, has anterior origin, small to normal. LM: Has superior takeoff, a 5 [place] 3.0 guide catheter was utilized to engage.  Normal left main. LAD: Large-caliber vessel giving origin to 2 large diagonals, smooth and normal. RI: Moderate caliber vessel, smooth and normal. LCx: Large vessel giving origin to large OM1 which has secondary branches and continues in the AV groove is a moderate-sized vessel and gives origin to OM 2 from the distal end.  Smooth and normal.  Impression and recommendations: Findings consistent with nonischemic cardiomyopathy.  Mildly elevated PA pressure with elevated EDP.     Echo 12/07/22: IMPRESSIONS   1. Left ventricular ejection fraction, by estimation, is 30 to 35%. The  left ventricle has moderately decreased function. The left ventricle  demonstrates global hypokinesis. There is mild left ventricular  hypertrophy. Indeterminate diastolic filling due   to E-A fusion.  2. Right ventricular systolic function is normal. The right ventricular  size is normal.   3. The mitral valve is normal in structure. Trivial mitral valve  regurgitation.   4. The aortic valve is tricuspid. Aortic valve regurgitation is not  visualized.   5. The inferior vena cava IVC not well visualized.  - Comparison(s): No significant change from prior study.  - Comparison LVEF: 30-35% 07/10/21; 50-55% 12/30/18; 30-35% 01/16/18; 35-40% 08/22/17; 30-35% 10/26/15, 06/08/13; 50% 11/06/11; 45-50% 06/27/11; 60% 01/13/07  Past Medical History:  Diagnosis Date   ADD  (attention deficit disorder)    Anxiety    Arthritis    both knees right worse than left   Carpal tunnel syndrome of right wrist    Depression    Essential hypertension, benign    Gallstones    History of smoking 06/22/2016   Hyperlipidemia associated with type 2 diabetes mellitus (HCC), on Zocor 04/04/2011   Hypertension associated with diabetes (HCC) 06/03/2008   Migraines    Morbid obesity (HCC)    Morbid obesity with BMI of 50.0-59.9, adult (HCC) 07/12/2006   NICM (nonischemic cardiomyopathy) (HCC) 07/12/2006   01/16/18 ECHO:    - Procedure narrative: Transthoracic echocardiography. Image   quality was suboptimal. The study was technically difficult.   Intravenous contrast (Definity) was administered. - Left ventricle: The cavity size was moderately dilated. Wall   thickness was increased in a pattern of mild LVH. Systolic   function was moderately to severely reduced. The estimated   ejection fraction w   Normal coronary arteries 04/17/2016   OSA on CPAP 10/28/2014   Spinal stenosis    back pain   Spondylosis without myelopathy or radiculopathy, lumbar region 12/04/2017   Type 2 diabetes mellitus with hyperglycemia [facility])     Past Surgical History:  Procedure Laterality Date   CHOLECYSTECTOMY     DILATATION & CURETTAGE/HYSTEROSCOPY WITH MYOSURE N/A 10/31/2017   Procedure: DILATATION & CURETTAGE/HYSTEROSCOPY WITH MYOSURE;  Surgeon: Romualdo Bolk, MD;  Location: [facility] ORS;  Service: Gynecology;  Laterality: N/A;   HYSTEROSCOPY WITH D & C N/A 05/07/2022   Procedure: DILATATION AND CURETTAGE /HYSTEROSCOPY;  Surgeon: [name], MD;  Location: [facility] OR;  Service: Gynecology;  Laterality: N/A;   RIGHT/LEFT HEART CATH AND CORONARY ANGIOGRAPHY N/A 04/11/2016   Procedure: Right/Left Heart Cath and Coronary Angiography;  Surgeon: Kathleene Hazel, MD;  Location: [facility] INVASIVE CV LAB;  Service: Cardiovascular;  Laterality: N/A;   RIGHT/LEFT HEART CATH AND CORONARY ANGIOGRAPHY N/A 12/09/2022    Procedure: RIGHT/LEFT HEART CATH AND CORONARY ANGIOGRAPHY;  Surgeon: Yates Decamp, MD;  Location: [facility] INVASIVE CV LAB;  Service: Cardiovascular;  Laterality: N/A;   sonogram for blood clots     no blockages    MEDICATIONS: No current facility-administered medications for this encounter.    acetaminophen (TYLENOL) 325 MG tablet   ALPRAZolam (XANAX) 0.5 MG tablet   atorvastatin (LIPITOR) 80 MG tablet   collagenase (SANTYL) 250 UNIT/GM ointment   dapagliflozin propanediol (FARXIGA) 10 MG TABS tablet   fluticasone (FLONASE) 50 MCG/ACT nasal spray   furosemide (LASIX) 40 MG tablet   losartan (COZAAR) 25 MG tablet   magnesium oxide (MAG-OX) 400 MG tablet   metFORMIN (GLUCOPHAGE-XR) 500 MG 24 hr tablet   methylphenidate (RITALIN) 5 MG tablet   metoprolol succinate (TOPROL-XL) 50 MG 24 hr tablet   NON FORMULARY   Oxycodone HCl 10 MG TABS   polyethylene glycol powder (GLYCOLAX/MIRALAX) 17 GM/SCOOP powder   potassium chloride SA (KLOR-CON M) 20  MEQ tablet   senna-docusate (SENOKOT-S) 8.6-50 MG tablet   ascorbic acid (VITAMIN C) 500 MG tablet   Blood Glucose Monitoring Suppl DEVI   carvedilol (COREG) 25 MG tablet   cefdinir (OMNICEF) 300 MG capsule   ciprofloxacin (CIPRO) 500 MG tablet   diclofenac Sodium (VOLTAREN) 1 % GEL   Glucose Blood (BLOOD GLUCOSE TEST STRIPS) STRP   ketoconazole (NIZORAL) 2 % cream   lidocaine (LIDODERM) 5 %   Multiple Vitamin (MULTIVITAMIN WITH MINERALS) TABS tablet   nutrition supplement, JUVEN, (JUVEN) PACK   spironolactone (ALDACTONE) 25 MG tablet   topiramate (TOPAMAX) 25 MG tablet   vortioxetine HBr (TRINTELLIX) 5 MG TABS tablet    Shonna Chock, PA-C Surgical Short Stay/Anesthesiology [facility] Phone [phone] [facility] Phone [phone] 02/05/2023 2:31 PM

## 2023-02-05 NOTE — Telephone Encounter (Signed)
Marcelino Duster from [facility] called and said patient has refused last two OT visits, including today's. Today's visit was supposed to be her dicharge visit, so they agreed on a non-visit discharge. Best callback for Marcelino Duster is [phone]).

## 2023-02-05 NOTE — Telephone Encounter (Signed)
FYI

## 2023-02-05 NOTE — Telephone Encounter (Signed)
PT had a MM f/u with Eddie at 3pm 02/04/23. PT called the day of to cancel the appointment due to having an unexpected leg operation. PT wasn't willing to do a virtual visit so we rescheduled tentatively to Jan 13th @ 3:30. PT isn't positive that date will work due to having more leg procedures and pace maker surgery. PT will be in communication with [place] about her follow ups.

## 2023-02-06 ENCOUNTER — Observation Stay (HOSPITAL_COMMUNITY)
Admission: RE | Admit: 2023-02-06 | Discharge: 2023-02-07 | Disposition: A | Discharge status: Home / Self Care | Payer: Medicare Other | Attending: Plastic Surgery | Admitting: Plastic Surgery

## 2023-02-06 ENCOUNTER — Ambulatory Visit (HOSPITAL_COMMUNITY): Payer: Medicare Other | Admitting: Vascular Surgery

## 2023-02-06 ENCOUNTER — Other Ambulatory Visit: Payer: Self-pay

## 2023-02-06 ENCOUNTER — Ambulatory Visit: Payer: Medicare Other | Admitting: Infectious Diseases

## 2023-02-06 ENCOUNTER — Encounter (HOSPITAL_COMMUNITY): Payer: Self-pay | Admitting: Plastic Surgery

## 2023-02-06 ENCOUNTER — Encounter (HOSPITAL_COMMUNITY)
Admission: RE | Disposition: A | Discharge status: Home / Self Care | Payer: Self-pay | Source: Home / Self Care | Attending: Plastic Surgery

## 2023-02-06 DIAGNOSIS — R7309 Other abnormal glucose: Secondary | ICD-10-CM | POA: Diagnosis not present

## 2023-02-06 DIAGNOSIS — S81802D Unspecified open wound, left lower leg, subsequent encounter: Secondary | ICD-10-CM

## 2023-02-06 DIAGNOSIS — Z9889 Other specified postprocedural states: Secondary | ICD-10-CM

## 2023-02-06 DIAGNOSIS — E119 Type 2 diabetes mellitus without complications: Secondary | ICD-10-CM | POA: Diagnosis not present

## 2023-02-06 DIAGNOSIS — X58XXXA Exposure to other specified factors, initial encounter: Secondary | ICD-10-CM | POA: Diagnosis not present

## 2023-02-06 DIAGNOSIS — I110 Hypertensive heart disease with heart failure: Secondary | ICD-10-CM | POA: Diagnosis not present

## 2023-02-06 DIAGNOSIS — S81802A Unspecified open wound, left lower leg, initial encounter: Principal | ICD-10-CM | POA: Diagnosis present

## 2023-02-06 DIAGNOSIS — S81801A Unspecified open wound, right lower leg, initial encounter: Principal | ICD-10-CM | POA: Insufficient documentation

## 2023-02-06 DIAGNOSIS — I5042 Chronic combined systolic (congestive) and diastolic (congestive) heart failure: Secondary | ICD-10-CM | POA: Diagnosis not present

## 2023-02-06 HISTORY — PX: INCISION AND DRAINAGE OF WOUND: SHX1803

## 2023-02-06 LAB — GLUCOSE, CAPILLARY
Glucose-Capillary: 127 mg/dL — ABNORMAL HIGH (ref 70–99)
Glucose-Capillary: 113 mg/dL — ABNORMAL HIGH (ref 70–99)
Glucose-Capillary: 113 mg/dL — ABNORMAL HIGH (ref 70–99)
Glucose-Capillary: 144 mg/dL — ABNORMAL HIGH (ref 70–99)

## 2023-02-06 SURGERY — IRRIGATION AND DEBRIDEMENT WOUND
Anesthesia: General | Laterality: Left

## 2023-02-06 MED ORDER — FENTANYL CITRATE (PF) 250 MCG/5ML IJ SOLN
INTRAMUSCULAR | Status: AC
  Filled 2023-02-06: qty 5

## 2023-02-06 MED ORDER — PROPOFOL 10 MG/ML IV BOLUS
INTRAVENOUS | Status: DC | PRN
  Administered 2023-02-06: 100 mg via INTRAVENOUS

## 2023-02-06 MED ORDER — LIDOCAINE 2% (20 MG/ML) 5 ML SYRINGE
INTRAMUSCULAR | Status: DC | PRN
  Administered 2023-02-06: 60 mg via INTRAVENOUS

## 2023-02-06 MED ORDER — ACETAMINOPHEN 325 MG PO TABS: 650.0000 mg | ORAL_TABLET | Freq: Four times a day (QID) | ORAL | Status: DC | PRN

## 2023-02-06 MED ORDER — HYDROMORPHONE HCL 1 MG/ML IJ SOLN
INTRAMUSCULAR | Status: AC
  Filled 2023-02-06: qty 1

## 2023-02-06 MED ORDER — LIDOCAINE-EPINEPHRINE 1 %-1:100000 IJ SOLN
INTRAMUSCULAR | Status: AC
  Filled 2023-02-06: qty 1

## 2023-02-06 MED ORDER — FENTANYL CITRATE (PF) 250 MCG/5ML IJ SOLN
INTRAMUSCULAR | Status: DC | PRN
  Administered 2023-02-06 (×3): 50 ug via INTRAVENOUS
  Administered 2023-02-06: 100 ug via INTRAVENOUS

## 2023-02-06 MED ORDER — ALPRAZOLAM 0.5 MG PO TABS
0.5000 mg | ORAL_TABLET | Freq: Two times a day (BID) | ORAL | Status: DC | PRN
  Administered 2023-02-06 – 2023-02-07 (×2): 0.5 mg via ORAL
  Filled 2023-02-06 (×2): qty 1

## 2023-02-06 MED ORDER — INSULIN ASPART 100 UNIT/ML IJ SOLN: 0.0000 [IU] | Freq: Every day | INTRAMUSCULAR | Status: DC

## 2023-02-06 MED ORDER — ACETAMINOPHEN 650 MG RE SUPP: 650.0000 mg | Freq: Four times a day (QID) | RECTAL | Status: DC | PRN

## 2023-02-06 MED ORDER — HYDROMORPHONE HCL 1 MG/ML IJ SOLN
0.2500 mg | INTRAMUSCULAR | Status: DC | PRN
  Administered 2023-02-06 (×4): 0.5 mg via INTRAVENOUS

## 2023-02-06 MED ORDER — CHLORHEXIDINE GLUCONATE 0.12 % MT SOLN
15.0000 mL | Freq: Once | OROMUCOSAL | Status: AC
  Administered 2023-02-06: 15 mL via OROMUCOSAL
  Filled 2023-02-06: qty 15

## 2023-02-06 MED ORDER — OXYCODONE HCL 5 MG/5ML PO SOLN: 5.0000 mg | Freq: Once | ORAL | Status: DC | PRN

## 2023-02-06 MED ORDER — PHENYLEPHRINE 80 MCG/ML (10ML) SYRINGE FOR IV PUSH (FOR BLOOD PRESSURE SUPPORT)
PREFILLED_SYRINGE | INTRAVENOUS | Status: DC | PRN
  Administered 2023-02-06: 160 ug via INTRAVENOUS

## 2023-02-06 MED ORDER — ONDANSETRON HCL 4 MG/2ML IJ SOLN: 4.0000 mg | Freq: Four times a day (QID) | INTRAMUSCULAR | Status: DC | PRN

## 2023-02-06 MED ORDER — POLYETHYLENE GLYCOL 3350 17 G PO PACK: 17.0000 g | PACK | Freq: Every day | ORAL | Status: DC | PRN

## 2023-02-06 MED ORDER — MIDAZOLAM HCL 2 MG/2ML IJ SOLN
INTRAMUSCULAR | Status: AC
  Filled 2023-02-06: qty 2

## 2023-02-06 MED ORDER — DEXAMETHASONE SODIUM PHOSPHATE 10 MG/ML IJ SOLN
INTRAMUSCULAR | Status: AC
  Filled 2023-02-06: qty 1

## 2023-02-06 MED ORDER — PROPOFOL 10 MG/ML IV BOLUS
INTRAVENOUS | Status: AC
  Filled 2023-02-06: qty 20

## 2023-02-06 MED ORDER — ONDANSETRON HCL 4 MG/2ML IJ SOLN
INTRAMUSCULAR | Status: AC
  Filled 2023-02-06: qty 2

## 2023-02-06 MED ORDER — CHLORHEXIDINE GLUCONATE CLOTH 2 % EX PADS: 6.0000 | MEDICATED_PAD | Freq: Once | CUTANEOUS | Status: DC

## 2023-02-06 MED ORDER — CIPROFLOXACIN HCL 500 MG PO TABS: 500.0000 mg | ORAL_TABLET | Freq: Two times a day (BID) | ORAL | Status: DC

## 2023-02-06 MED ORDER — ONDANSETRON HCL 4 MG/2ML IJ SOLN: 4.0000 mg | Freq: Once | INTRAMUSCULAR | Status: DC | PRN

## 2023-02-06 MED ORDER — IBUPROFEN 600 MG PO TABS: 600.0000 mg | ORAL_TABLET | Freq: Four times a day (QID) | ORAL | Status: DC | PRN

## 2023-02-06 MED ORDER — CEFDINIR 300 MG PO CAPS
300.0000 mg | ORAL_CAPSULE | Freq: Two times a day (BID) | ORAL | Status: DC
  Administered 2023-02-06 – 2023-02-07 (×2): 300 mg via ORAL
  Filled 2023-02-06 (×4): qty 1

## 2023-02-06 MED ORDER — VASHE WOUND IRRIGATION OPTIME
TOPICAL | Status: DC | PRN
  Administered 2023-02-06: 34 [oz_av]

## 2023-02-06 MED ORDER — ACETAMINOPHEN 500 MG PO TABS
1000.0000 mg | ORAL_TABLET | Freq: Once | ORAL | Status: AC
  Administered 2023-02-06: 1000 mg via ORAL
  Filled 2023-02-06: qty 2

## 2023-02-06 MED ORDER — INSULIN ASPART 100 UNIT/ML IJ SOLN: 0.0000 [IU] | INTRAMUSCULAR | Status: DC | PRN

## 2023-02-06 MED ORDER — 0.9 % SODIUM CHLORIDE (POUR BTL) OPTIME
TOPICAL | Status: DC | PRN
  Administered 2023-02-06: 3000 mL

## 2023-02-06 MED ORDER — OXYCODONE HCL 5 MG PO TABS
5.0000 mg | ORAL_TABLET | ORAL | Status: DC | PRN
  Administered 2023-02-06 – 2023-02-07 (×3): 10 mg via ORAL
  Filled 2023-02-06 (×3): qty 2

## 2023-02-06 MED ORDER — OXYCODONE HCL 5 MG PO TABS: 5.0000 mg | ORAL_TABLET | Freq: Once | ORAL | Status: DC | PRN

## 2023-02-06 MED ORDER — DEXAMETHASONE SODIUM PHOSPHATE 10 MG/ML IJ SOLN
INTRAMUSCULAR | Status: DC | PRN
  Administered 2023-02-06: 5 mg via INTRAVENOUS

## 2023-02-06 MED ORDER — SODIUM CHLORIDE 0.9 % IV SOLN: INTRAVENOUS | Status: DC

## 2023-02-06 MED ORDER — CIPROFLOXACIN HCL 500 MG PO TABS
500.0000 mg | ORAL_TABLET | Freq: Two times a day (BID) | ORAL | Status: DC
  Administered 2023-02-06 – 2023-02-07 (×2): 500 mg via ORAL
  Filled 2023-02-06 (×2): qty 1

## 2023-02-06 MED ORDER — CIPROFLOXACIN IN D5W 400 MG/200ML IV SOLN
400.0000 mg | INTRAVENOUS | Status: AC
  Administered 2023-02-06: 400 mg via INTRAVENOUS
  Filled 2023-02-06: qty 200

## 2023-02-06 MED ORDER — ONDANSETRON 4 MG PO TBDP: 4.0000 mg | ORAL_TABLET | Freq: Four times a day (QID) | ORAL | Status: DC | PRN

## 2023-02-06 MED ORDER — ORAL CARE MOUTH RINSE: 15.0000 mL | Freq: Once | OROMUCOSAL | Status: AC

## 2023-02-06 MED ORDER — ONDANSETRON HCL 4 MG/2ML IJ SOLN
INTRAMUSCULAR | Status: DC | PRN
  Administered 2023-02-06: 4 mg via INTRAVENOUS

## 2023-02-06 MED ORDER — INSULIN ASPART 100 UNIT/ML IJ SOLN
0.0000 [IU] | Freq: Three times a day (TID) | INTRAMUSCULAR | Status: DC
  Administered 2023-02-07 (×2): 3 [IU] via SUBCUTANEOUS

## 2023-02-06 MED ORDER — MIDAZOLAM HCL 2 MG/2ML IJ SOLN
INTRAMUSCULAR | Status: DC | PRN
  Administered 2023-02-06: 2 mg via INTRAVENOUS

## 2023-02-06 SURGICAL SUPPLY — 59 items
APPLICATOR COTTON TIP 6 STRL (MISCELLANEOUS) IMPLANT
APPLICATOR COTTON TIP 6IN STRL (MISCELLANEOUS)
BAG COUNTER SPONGE SURGICOUNT (BAG) ×2 IMPLANT
BAG DECANTER FOR FLEXI CONT (MISCELLANEOUS) IMPLANT
BENZOIN TINCTURE PRP APPL 2/3 (GAUZE/BANDAGES/DRESSINGS) ×2 IMPLANT
BNDG ELASTIC 6INX 5YD STR LF (GAUZE/BANDAGES/DRESSINGS) IMPLANT
BNDG GAUZE DERMACEA FLUFF 4 (GAUZE/BANDAGES/DRESSINGS) IMPLANT
CANISTER SUCT 3000ML PPV (MISCELLANEOUS) ×2 IMPLANT
CLEANSER WND VASHE 34 (WOUND CARE) IMPLANT
CNTNR URN SCR LID CUP LEK RST (MISCELLANEOUS) IMPLANT
COVER SURGICAL LIGHT HANDLE (MISCELLANEOUS) ×2 IMPLANT
DRAIN CHANNEL 19F RND (DRAIN) IMPLANT
DRAIN JP 10F RND SILICONE (MISCELLANEOUS) IMPLANT
DRAPE HALF SHEET 40X57 (DRAPES) IMPLANT
DRAPE IMP U-DRAPE 54X76 (DRAPES) ×2 IMPLANT
DRAPE INCISE IOBAN 66X45 STRL (DRAPES) IMPLANT
DRAPE LAPAROSCOPIC ABDOMINAL (DRAPES) IMPLANT
DRAPE LAPAROTOMY 100X72 PEDS (DRAPES) ×2 IMPLANT
DRSG ADAPTIC 3X8 NADH LF (GAUZE/BANDAGES/DRESSINGS) IMPLANT
DRSG CALCIUM ALGINATE 4X4 (GAUZE/BANDAGES/DRESSINGS) IMPLANT
DRSG CUTIMED SORBACT 7X9 (GAUZE/BANDAGES/DRESSINGS) IMPLANT
DRSG VAC GRANUFOAM LG (GAUZE/BANDAGES/DRESSINGS) IMPLANT
DRSG VAC GRANUFOAM MED (GAUZE/BANDAGES/DRESSINGS) IMPLANT
DRSG VAC GRANUFOAM SM (GAUZE/BANDAGES/DRESSINGS) IMPLANT
ELECT CAUTERY BLADE 6.4 (BLADE) IMPLANT
ELECT REM PT RETURN 9FT ADLT (ELECTROSURGICAL) ×1
ELECTRODE REM PT RTRN 9FT ADLT (ELECTROSURGICAL) ×2 IMPLANT
GAUZE PAD ABD 8X10 STRL (GAUZE/BANDAGES/DRESSINGS) IMPLANT
GAUZE SPONGE 4X4 12PLY STRL (GAUZE/BANDAGES/DRESSINGS) IMPLANT
GLOVE BIO SURGEON STRL SZ8 (GLOVE) ×2 IMPLANT
GLOVE BIOGEL PI IND STRL 8 (GLOVE) ×2 IMPLANT
GOWN STRL REUS W/ TWL LRG LVL3 (GOWN DISPOSABLE) ×6 IMPLANT
GOWN STRL REUS W/TWL XL LVL3 (GOWN DISPOSABLE) ×2 IMPLANT
GRAFT MYRIAD 3 LAYER 10X10 (Graft) IMPLANT
GRAFT MYRIAD 3 LAYER 10X20 (Graft) IMPLANT
IMMOBILIZER KNEE 24 THIGH 36 (MISCELLANEOUS) IMPLANT
IMMOBILIZER KNEE 24 UNIV (MISCELLANEOUS) ×1
KIT BASIN OR (CUSTOM PROCEDURE TRAY) ×2 IMPLANT
KIT TURNOVER KIT B (KITS) ×2 IMPLANT
NDL HYPO 25GX1X1/2 BEV (NEEDLE) ×2 IMPLANT
NEEDLE HYPO 25GX1X1/2 BEV (NEEDLE) ×1
NS IRRIG 1000ML POUR BTL (IV SOLUTION) ×2 IMPLANT
PACK GENERAL/GYN (CUSTOM PROCEDURE TRAY) ×2 IMPLANT
PACK UNIVERSAL I (CUSTOM PROCEDURE TRAY) ×2 IMPLANT
PAD ARMBOARD 7.5X6 YLW CONV (MISCELLANEOUS) ×4 IMPLANT
STAPLER VISISTAT 35W (STAPLE) ×2 IMPLANT
SURGILUBE 2OZ TUBE FLIPTOP (MISCELLANEOUS) IMPLANT
SUT MNCRL AB 3-0 PS2 27 (SUTURE) IMPLANT
SUT MNCRL AB 4-0 PS2 18 (SUTURE) IMPLANT
SUT MON AB 2-0 CT1 36 (SUTURE) IMPLANT
SUT MON AB 5-0 PS2 18 (SUTURE) IMPLANT
SUT PROLENE 4 0 PS 2 18 (SUTURE) IMPLANT
SUT VIC AB 5-0 PS2 18 (SUTURE) IMPLANT
SUT VICRYL 3 0 (SUTURE) IMPLANT
SWAB COLLECTION DEVICE MRSA (MISCELLANEOUS) IMPLANT
SWAB CULTURE ESWAB REG 1ML (MISCELLANEOUS) IMPLANT
SYR CONTROL 10ML LL (SYRINGE) ×2 IMPLANT
TOWEL GREEN STERILE (TOWEL DISPOSABLE) ×2 IMPLANT
UNDERPAD 30X36 HEAVY ABSORB (UNDERPADS AND DIAPERS) ×2 IMPLANT

## 2023-02-06 NOTE — Op Note (Signed)
DATE OF OPERATION: 02/06/2023  LOCATION: [facility] Main operating Room  PREOPERATIVE DIAGNOSIS: Left lower extremity wound  POSTOPERATIVE DIAGNOSIS: Same  PROCEDURE: Debridement of left lower extremity wound with placement of myriad tissue replacement matrix  SURGEON: Loren Racer, MD  ASSISTANT: Caroline More  EBL: 25 cc  CONDITION: Stable  COMPLICATIONS: None  INDICATION: The patient, [name], is a 58 y.o. female born on Aug 03, 1965, is here for treatment of the left lower extremity wound sustained after placement of an intraosseous infusion device.  The wound was managed conservatively until the eschar separated and she was taken to the operating room for debridement of deeper tissues and placement of a wound matrix.  PROCEDURE DETAILS:  The patient was seen prior to surgery and marked.   IV antibiotics were given. The patient was taken to the operating room and given a general anesthetic. A standard time out was performed and all information was confirmed by those in the room. SCDs were placed.   Left lower extremity was prepped and draped in usual sterile manner.  The entire surface of the wound was debrided sharply with scalpel and scissors.  The wound measured approximately 20 x 17 cm.  After debridement of the wound hemostasis was achieved with the electrocautery.  The wound was then copiously irrigated with 2-1/2 L of warm normal saline.  I then elected to place myriad tissue replacement matrix over the entire surface of the wound including the exposed patellar tendon 2 pieces of the matrix were used 110 x 20 cm and 110 x 10 cm.  These were trimmed appropriately to fit the contours of the wound.  The matrix was then sutured to the skin edges with interrupted and running 4-0 Monocryl sutures.  A portion of Sorbact was then placed over the wound and sutured to the skin edges with a running 4-0 Prolene suture.  The entire surface was then coated with water-based surgical lubricant and Vashe  soaked gauzes were placed over the wound.  The wound was wrapped with Kerlix and a 6 inch Ace wrap and the patient was placed in a knee immobilizer.  She was awakened from anesthesia without incident transferred to the recovery room in good condition all instrument needle and sponge counts reported as correct.  The patient will be admitted for observation overnight. The patient was allowed to wake up and taken to recovery room in stable condition at the end of the case. The family was notified at the end of the case.   The advanced practice practitioner (APP) assisted throughout the case.  The APP was essential in retraction and counter traction when needed to make the case progress smoothly.  This retraction and assistance made it possible to see the tissue plans for the procedure.  The assistance was needed for blood control, tissue re-approximation and assisted with closure of the incision site.

## 2023-02-06 NOTE — Progress Notes (Signed)
Orthopedic Tech Progress Note Patient Details:  [name] [birth year] [id number]  Pt already has knee immobilizer on per PACU RN  Patient ID: [name], female   DOB: [birth year], 57 y.o.   MRN: [id number]  [name] 02/06/2023, 7:17 PM

## 2023-02-06 NOTE — Interval H&P Note (Signed)
History and Physical Interval Note: No change in exam or indication for surgery Left leg marked All questions answered Will proceed with debridement, placement of myriad at her request  02/06/2023 3:21 PM  [name]  has presented today for surgery, with the diagnosis of Wound of left lower extremity, subsequent encounter.  The various methods of treatment have been discussed with the patient and family. After consideration of risks, benefits and other options for treatment, the patient has consented to  Procedure(s): debridement of left leg wound, application of myriad (Left) as a surgical intervention.  The patient's history has been reviewed, patient examined, no change in status, stable for surgery.  I have reviewed the patient's chart and labs.  Questions were answered to the patient's satisfaction.     [name]

## 2023-02-06 NOTE — Transfer of Care (Signed)
Immediate Anesthesia Transfer of Care Note  Patient: [name]  Procedure(s) Performed: debridement of left leg wound, application of myriad (Left)  Patient Location: PACU  Anesthesia Type:General  Level of Consciousness: awake, oriented, and patient cooperative  Airway & Oxygen Therapy: Patient Spontanous Breathing and Patient connected to nasal cannula oxygen  Post-op Assessment: Report given to RN, Post -op Vital signs reviewed and stable, and Patient moving all extremities X 4  Post vital signs: Reviewed and stable  Last Vitals:  Vitals Value Taken Time  BP 146/88 02/06/23 1732  Temp 97.6   Pulse 98 02/06/23 1735  Resp 21 02/06/23 1735  SpO2 98 % 02/06/23 1735  Vitals shown include unfiled device data.  Last Pain:  Vitals:   02/06/23 1342  TempSrc:   PainSc: 6       Patients Stated Pain Goal: 2 (02/06/23 1342)  Complications: No notable events documented.

## 2023-02-06 NOTE — Anesthesia Procedure Notes (Signed)
Procedure Name: LMA Insertion Date/Time: 02/06/2023 3:50 PM  Performed by: Gus Puma, CRNAPre-anesthesia Checklist: Patient identified, Emergency Drugs available, Suction available and Patient being monitored Patient Re-evaluated:Patient Re-evaluated prior to induction Oxygen Delivery Method: Circle System Utilized Preoxygenation: Pre-oxygenation with 100% oxygen Induction Type: IV induction Ventilation: Mask ventilation without difficulty LMA: LMA inserted LMA Size: 4.0 Number of attempts: 1 Airway Equipment and Method: Bite block Placement Confirmation: positive ETCO2 Tube secured with: Tape Dental Injury: Teeth and Oropharynx as per pre-operative assessment

## 2023-02-07 ENCOUNTER — Encounter (HOSPITAL_COMMUNITY): Payer: Self-pay | Admitting: Plastic Surgery

## 2023-02-07 DIAGNOSIS — S81801A Unspecified open wound, right lower leg, initial encounter: Secondary | ICD-10-CM | POA: Diagnosis not present

## 2023-02-07 DIAGNOSIS — R7309 Other abnormal glucose: Secondary | ICD-10-CM | POA: Diagnosis not present

## 2023-02-07 DIAGNOSIS — S81802D Unspecified open wound, left lower leg, subsequent encounter: Secondary | ICD-10-CM | POA: Diagnosis not present

## 2023-02-07 LAB — GLUCOSE, CAPILLARY
Glucose-Capillary: 154 mg/dL — ABNORMAL HIGH (ref 70–99)
Glucose-Capillary: 159 mg/dL — ABNORMAL HIGH (ref 70–99)

## 2023-02-07 MED ORDER — CIPROFLOXACIN HCL 500 MG PO TABS: 500.0000 mg | ORAL_TABLET | Freq: Two times a day (BID) | ORAL | 0 refills | Status: AC

## 2023-02-07 NOTE — Discharge Instructions (Addendum)
-  DRESSING CHANGES: Remove dressings down to the Sorbact (green mesh dressing). DO NOT REMOVE SORBACT (GREEN MESH DRESSING). Apply KY jelly over the Sorbact, then Vashe-soaked 4x4 gauze, an ABD pad and wrap with Kerlex and ACE Wrap. Change dressings daily.   -DO NOT shower or get the left lower extremity wet   -Keep knee straight at all times. Recommend wearing knee immobilizer to reinforce keeping the knee straight. You may bear weight on your left lower extremity.   -Continue to take Ciprofloxacin twice daily. Please follow up with infectious disease and make an appointment with them as soon as possible.

## 2023-02-07 NOTE — Evaluation (Signed)
Physical Therapy Evaluation Patient Details Name: [name] MRN: [id number] DOB: 07-Mar-1965 Today's Date: 02/07/2023  History of Present Illness  57 yo female admitted 12/12 for [name] debridement with tissue matrix placed. PMHx: 12/05/22 cardiac arrest  with ROSC after 40 min. Pt developed full thickness [name] wound due to cellulitis and fluid in the field. HTN, HLD, HRrEF 30-35%, DM, and morbid obesity, arthritis, ADD, OSA on CPAP, T2DM  Clinical Impression  Pt pleasant, anxious and reports baseline back and right hip pain she was performing pool therapy for prior to cardiac arrest in Oct. Since then pt with wound on [name] also impacting pain and gait further adding to RLE and back pain. Pt with KI on throughout session with education for wear and positioning. Pt educated for transfers, gait and stairs with KI maintained. Pt with decreased activity, cognition and balance who will benefit from acute therapy as well as HHPT to maximize independence and safety.  HR 83 Pt without lifevest during session, spouse arrived with it end of session         If plan is discharge home, recommend the following: Assistance with cooking/housework;Assist for transportation;Help with stairs or ramp for entrance;A little help with bathing/dressing/bathroom;A little help with walking and/or transfers   Can travel by private vehicle        Equipment Recommendations None recommended by PT  Recommendations for Other Services  OT consult    Functional Status Assessment Patient has had a recent decline in their functional status and demonstrates the ability to make significant improvements in function in a reasonable and predictable amount of time.     Precautions / Restrictions Precautions Precautions: Fall;Other (comment) Required Braces or Orthoses: Knee Immobilizer - Left Knee Immobilizer - Left: On at all times Restrictions [name] Weight Bearing Per Provider Order: Weight bearing as tolerated       Mobility  Bed Mobility               General bed mobility comments: EOB    Transfers Overall transfer level: Needs assistance   Transfers: Sit to/from Stand Sit to Stand: Min assist           General transfer comment: min assist to rise from bed with RUE hooking therapist, min assist to rise from recliner with use of bil armrests, cues for hand placement and safety    Ambulation/Gait Ambulation/Gait assistance: Supervision Gait Distance (Feet): 75 Feet Assistive device: Rolling walker (2 wheels) Gait Pattern/deviations: Step-through pattern, Decreased stride length   Gait velocity interpretation: <1.8 ft/sec, indicate of risk for recurrent falls   General Gait Details: KI [name] throughout with cues for posture, proximity to RW and safety with KI limiting swing phase and speed  Stairs Stairs: Yes       General stair comments: demonstration for spouse, son and pt with P.T. using RW to demonstrate with trash can as step since pt unable to walk to step itself. all voiced understanding of education  Wheelchair Mobility     Tilt Bed    Modified Rankin (Stroke Patients Only)       Balance Overall balance assessment: Needs assistance Sitting-balance support: Feet supported, No upper extremity supported Sitting balance-Leahy Scale: Fair     Standing balance support: Bilateral upper extremity supported, During functional activity, Reliant on assistive device for balance Standing balance-Leahy Scale: Poor Standing balance comment: RW in standing  Pertinent Vitals/Pain Pain Assessment Pain Assessment: 0-10 Pain Score: 4  Pain Location: left knee and right hip Pain Descriptors / Indicators: Aching Pain Intervention(s): Limited activity within patient's tolerance, Premedicated before session, Monitored during session, Repositioned    Home Living Family/patient expects to be discharged to:: Private residence Living  Arrangements: Spouse/significant other Available Help at Discharge: Family;Available 24 hours/day Type of Home: House Home Access: Stairs to enter   Entrance Stairs-Number of Steps: 1 Alternate Level Stairs-Number of Steps: 14 Home Layout: Two level;1/2 bath on main level;Bed/bath upstairs Home Equipment: Rollator (4 wheels);Electric scooter;BSC/3in1      Prior Function Prior Level of Function : Needs assist       Physical Assist : Mobility (physical);ADLs (physical) Mobility (physical): Transfers ADLs (physical): Bathing;Dressing Mobility Comments: pt has been sleeping on couch down stairs. spouse assists with transfers off the couch, limited walking but furniture surfs in home, Rollator in community ADLs Comments: spouse does all IADL's and assists with ADLs     Extremity/Trunk Assessment   Upper Extremity Assessment Upper Extremity Assessment: Overall WFL for tasks assessed    Lower Extremity Assessment Lower Extremity Assessment: [name] deficits/detail;Generalized weakness [name] Deficits / Details: KI maintained limiting ROM    Cervical / Trunk Assessment Cervical / Trunk Assessment: Other exceptions Cervical / Trunk Exceptions: increased body habitus  Communication   Communication Communication: Difficulty communicating thoughts/reduced clarity of speech Cueing Techniques: Verbal cues  Cognition Arousal: Alert Behavior During Therapy: Anxious Overall Cognitive Status: Impaired/Different from baseline Area of Impairment: Problem solving                             Problem Solving: Slow processing General Comments: pt with slow processing, difficulty with word finding and reports anxiety over medical complexities since Oct        General Comments      Exercises     Assessment/Plan    PT Assessment Patient needs continued PT services  PT Problem List Decreased strength;Decreased balance;Decreased range of motion;Decreased activity  tolerance;Decreased safety awareness;Decreased mobility;Decreased knowledge of use of DME;Pain       PT Treatment Interventions DME instruction;Functional mobility training;Balance training;Patient/family education;Gait training;Therapeutic activities;Therapeutic exercise;Stair training    PT Goals (Current goals can be found in the Care Plan section)  Acute Rehab PT Goals Patient Stated Goal: be able to walk PT Goal Formulation: With patient Time For Goal Achievement: 02/21/23 Potential to Achieve Goals: Good    Frequency Min 1X/week     Co-evaluation               AM-PAC PT "6 Clicks" Mobility  Outcome Measure Help needed turning from your back to your side while in a flat bed without using bedrails?: A Little Help needed moving from lying on your back to sitting on the side of a flat bed without using bedrails?: A Little Help needed moving to and from a bed to a chair (including a wheelchair)?: A Little Help needed standing up from a chair using your arms (e.g., wheelchair or bedside chair)?: A Little Help needed to walk in hospital room?: A Little Help needed climbing 3-5 steps with a railing? : A Little 6 Click Score: 18    End of Session Equipment Utilized During Treatment: Left knee immobilizer Activity Tolerance: Patient tolerated treatment well Patient left: in chair;with call bell/phone within reach;with family/visitor present Nurse Communication: Mobility status;Weight bearing status;Precautions PT Visit Diagnosis: Other abnormalities of gait and mobility (R26.89);Muscle  weakness (generalized) (M62.81)    Time: 1610-9604 PT Time Calculation (min) (ACUTE ONLY): 33 min   Charges:   PT Evaluation $PT Eval Moderate Complexity: 1 Mod PT Treatments $Therapeutic Activity: 8-22 mins PT General Charges $$ ACUTE PT VISIT: 1 Visit         Merryl Hacker, PT Acute Rehabilitation Services Office: [phone]   [name] [name] 02/07/2023, 3:45 PM

## 2023-02-07 NOTE — Plan of Care (Signed)
  Problem: Education: Goal: Knowledge of General Education information will improve Description: Including pain rating scale, medication(s)/side effects and non-pharmacologic comfort measures Outcome: Adequate for Discharge   Problem: Health Behavior/Discharge Planning: Goal: Ability to manage health-related needs will improve Outcome: Adequate for Discharge   Problem: Clinical Measurements: Goal: Ability to maintain clinical measurements within normal limits will improve Outcome: Adequate for Discharge Goal: Will remain free from infection Outcome: Adequate for Discharge Goal: Diagnostic test results will improve Outcome: Adequate for Discharge Goal: Respiratory complications will improve Outcome: Adequate for Discharge Goal: Cardiovascular complication will be avoided Outcome: Adequate for Discharge   Problem: Activity: Goal: Risk for activity intolerance will decrease Outcome: Adequate for Discharge   Problem: Nutrition: Goal: Adequate nutrition will be maintained Outcome: Adequate for Discharge   Problem: Coping: Goal: Level of anxiety will decrease Outcome: Adequate for Discharge   Problem: Elimination: Goal: Will not experience complications related to bowel motility Outcome: Adequate for Discharge Goal: Will not experience complications related to urinary retention Outcome: Adequate for Discharge   Problem: Pain Management: Goal: General experience of comfort will improve Outcome: Adequate for Discharge   Problem: Safety: Goal: Ability to remain free from injury will improve Outcome: Adequate for Discharge   Problem: Education: Goal: Ability to describe self-care measures that may prevent or decrease complications (Diabetes Survival Skills Education) will improve Outcome: Adequate for Discharge Goal: Individualized Educational Video(s) Outcome: Adequate for Discharge   Problem: Fluid Volume: Goal: Ability to maintain a balanced intake and output will  improve Outcome: Adequate for Discharge   Problem: Metabolic: Goal: Ability to maintain appropriate glucose levels will improve Outcome: Adequate for Discharge   Problem: Tissue Perfusion: Goal: Adequacy of tissue perfusion will improve Outcome: Adequate for Discharge

## 2023-02-07 NOTE — Anesthesia Postprocedure Evaluation (Signed)
Anesthesia Post Note  Patient: [name]  Procedure(s) Performed: debridement of left leg wound, application of myriad (Left)     Patient location during evaluation: PACU Anesthesia Type: General Level of consciousness: awake and alert Pain management: pain level controlled Vital Signs Assessment: post-procedure vital signs reviewed and stable Respiratory status: spontaneous breathing, nonlabored ventilation, respiratory function stable and patient connected to nasal cannula oxygen Cardiovascular status: blood pressure returned to baseline and stable Postop Assessment: no apparent nausea or vomiting Anesthetic complications: no   No notable events documented.  Last Vitals:  Vitals:   02/07/23 0509 02/07/23 0752  BP: 106/74 106/78  Pulse:    Resp: 18 18  Temp: 36.4 C 36.5 C  SpO2:      Last Pain:  Vitals:   02/07/23 0752  TempSrc: Oral  PainSc:                  Nelle Don Dominique Calvey

## 2023-02-07 NOTE — Progress Notes (Signed)
1 Day Post-Op  Subjective: Patient is a 57 year old female who underwent debridement of left lower extremity wound with placement of myriad tissue matrix with Dr. Ladona Ridgel yesterday, 02/06/2023.  She is postop day 1.  She was admitted overnight to the hospital for observation and pain control.  Today, patient reports she is doing well.  She states that she did okay overnight.  She denies any acute events or issues.  She does reports she has some pain to her left lower extremity, no new or worsening pain though.  She states that she has not gotten up out of bed yet.  Objective: Vital signs in last 24 hours: Temp:  [97.4 F (36.3 C)-98.1 F (36.7 C)] 97.7 F (36.5 C) (12/13 0752) Pulse Rate:  [76-104] 87 (12/13 0056) Resp:  [9-19] 18 (12/13 0752) BP: (95-146)/(67-92) 106/78 (12/13 0752) SpO2:  [91 %-99 %] 91 % (12/13 0056) Weight:  [124.1 kg-124.5 kg] 124.5 kg (12/12 2020) Last BM Date : 02/05/23  Intake/Output from previous day: 12/12 0701 - 12/13 0700 In: 1260 [I.V.:1060; IV Piggyback:200] Out: 450 [Urine:400; Blood:50] Intake/Output this shift: Total I/O In: -  Out: 300 [Urine:300]  General appearance: alert, cooperative, and no distress Resp: Unlabored breathing, no respiratory distress Extremities: Knee immobilizer in place to the left lower extremity.  This was opened and dressings underneath are clean dry and intact.  Lab Results:     Latest Ref Rng & Units 01/15/2023    3:18 PM 12/30/2022    6:08 AM 12/26/2022    6:49 AM  CBC  WBC 4.0 - 10.5 K/uL 12.4  8.9  7.3   Hemoglobin 12.0 - 15.0 g/dL 56.2  13.0  86.5   Hematocrit 36.0 - 46.0 % 42.2  34.8  36.1   Platelets 150.0 - 400.0 K/uL 423.0  288  303     BMET No results for input(s): "NA", "K", "CL", "CO2", "GLUCOSE", "BUN", "CREATININE", "CALCIUM" in the last 72 hours. PT/INR No results for input(s): "LABPROT", "INR" in the last 72 hours. ABG No results for input(s): "PHART", "HCO3" in the last 72 hours.  Invalid  input(s): "PCO2", "PO2"  Studies/Results: No results found.  Anti-infectives: Anti-infectives (From admission, onward)    Start     Dose/Rate Route Frequency Ordered Stop   02/07/23 0800  ciprofloxacin (CIPRO) tablet 500 mg  Status:  Discontinued        500 mg Oral 2 times daily 02/06/23 1753 02/06/23 2002   02/06/23 2315  cefdinir (OMNICEF) capsule 300 mg        300 mg Oral Every 12 hours 02/06/23 2219     02/06/23 2215  ciprofloxacin (CIPRO) tablet 500 mg        500 mg Oral 2 times daily 02/06/23 2122     02/06/23 1330  ciprofloxacin (CIPRO) IVPB 400 mg        400 mg 200 mL/hr over 60 Minutes Intravenous On call to O.R. 02/06/23 1315 02/06/23 1555       Assessment/Plan: s/p Procedure(s): debridement of left leg wound, application of myriad  -Patient doing well postop day 1.  I discussed with her in length the details of her dressing changes.  I told her that these would be also outlined in her discharge instructions and that I am also going to send these over to her home health agency.  Patient expressed understanding.  - discussed with patient the importance of ambulating to help prevent blood clots.  I did discuss with her the possibility of  Lovenox.  I discussed with her that I would like her to work with physical therapy prior to her discharge today to help her ambulate with the knee immobilizer on.  I discussed with her that she may bear weight on that left lower extremity, but I would like her to keep her knee straight as her tendon is exposed.  Patient expressed understanding.  -Discussed with patient that I will send her some more ciprofloxacin, but she is going to need to follow-up with infectious disease as soon as possible.  She states she was supposed to have an appointment with them yesterday, but she did not go.  I instructed her the importance of following up with them.  She expressed understanding.  -Recommended that patient do outpatient physical therapy as well to  help strengthen lower extremities and help her ambulate.  Will work to see if this can be done through the home health care agency.  Patient was in agreement with this plan.  -Discussed with the patient that I would like her to come into the clinic next week for evaluation.  It appears patient has an appointment next week already.  -I instructed the patient to call us if she has questions or concerns after she goes home today.  Discharge is pending PT evaluation.   LOS: 0 days    [name], PA-C 02/07/2023

## 2023-02-07 NOTE — Progress Notes (Signed)
Pt arrived to the unit, extensive MASD assessed in bilateral groin & abdominal folds; pt stated that she had a recent UTI & concerned about her antibiotics. Pt also stated that she typically takes Xanax for anxiety. Attempted to notify the MD on-call for Plastic Surgery. See new orders.  Bari Edward, RN

## 2023-02-07 NOTE — Discharge Summary (Signed)
Physician Discharge Summary  Patient ID: [name] MRN: [id number] DOB/AGE: [birth year] 57 y.o.  Admit date: 02/06/2023 Discharge date: 02/07/2023  Admission Diagnoses: Left lower extremity wound  Discharge Diagnoses:  Principal Problem:   Wound of left lower extremity Active Problems:   S/P debridement   Discharged Condition: good  Hospital Course: Patient is a 57 year old female who underwent debridement of left lower extremity wound with placement of myriad tissue matrix with Dr. Ladona Ridgel yesterday, 02/06/2023.  She is postop day 1.  She was admitted overnight to the hospital for observation and pain control.   This afternoon, patient is doing well.  She is sitting in the chair resting.  Her husband and her son are at bedside.  Per nursing staff, patient did physical therapy and was able to walk down the hallway.  Per nursing, physical therapy is recommending outpatient physical therapy or home health physical therapy.  Patient states that she is ready to go home.  Consults: None  Significant Diagnostic Studies: N/A  Treatments: surgery: Irrigation and debridement of left lower extremity wound with placement of myriad  Discharge Exam: Blood pressure 113/73, pulse 83, temperature 97.7 F (36.5 C), temperature source Oral, resp. rate 18, height 5\' 7"  (1.702 m), weight 124.5 kg, SpO2 91%. General appearance: alert, cooperative, and no distress Resp: Unlabored breathing, no respiratory distress Extremities: Knee immobilizer in place to left lower extremity.  Disposition: Discharge disposition: 01-Home or Self Care       Discharge Instructions     (HEART FAILURE PATIENTS) Call MD:  Anytime you have any of the following symptoms: 1) 3 pound weight gain in 24 hours or 5 pounds in 1 week 2) shortness of breath, with or without a dry hacking cough 3) swelling in the hands, feet or stomach 4) if you have to sleep on extra pillows at night in order to breathe.   Complete by: As  directed    Call MD for:  difficulty breathing, headache or visual disturbances   Complete by: As directed    Call MD for:  extreme fatigue   Complete by: As directed    Call MD for:  hives   Complete by: As directed    Call MD for:  persistant dizziness or light-headedness   Complete by: As directed    Call MD for:  persistant nausea and vomiting   Complete by: As directed    Call MD for:  redness, tenderness, or signs of infection (pain, swelling, redness, odor or green/yellow discharge around incision site)   Complete by: As directed    Call MD for:  severe uncontrolled pain   Complete by: As directed    Call MD for:  temperature >100.4   Complete by: As directed    Diet - low sodium heart healthy   Complete by: As directed    Increase activity slowly   Complete by: As directed       Allergies as of 02/07/2023       Reactions   Fluoxetine Other (See Comments)   More depressed   Cymbalta [duloxetine Hcl] Other (See Comments)   depressed   Pork-derived Products    Penicillins Rash   Has patient had a PCN reaction causing immediate rash, facial/tongue/throat swelling, SOB or lightheadedness with hypotension: YES Has patient had a PCN reaction causing severe rash involving mucus membranes or skin necrosis: NO Has patient had a PCN reaction that required hospitalization: YES Has patient had a PCN reaction occurring within the last 10 years:  NO If all of the above answers are "NO", then may proceed with Cephalosporin use.        Medication List     TAKE these medications    acetaminophen 325 MG tablet Commonly known as: TYLENOL Take 1-2 tablets (325-650 mg total) by mouth every 4 (four) hours as needed for mild pain (pain score 1-3).   ALPRAZolam 0.5 MG tablet Commonly known as: XANAX Take 1 tablet (0.5 mg total) by mouth 2 (two) times daily as needed for anxiety (if anxiety unrelieved by hydroxyzine).   ascorbic acid 500 MG tablet Commonly known as: VITAMIN  C Take 1 tablet (500 mg total) by mouth daily.   atorvastatin 80 MG tablet Commonly known as: LIPITOR Take 1 tablet (80 mg total) by mouth daily.   Blood Glucose Monitoring Suppl Devi 1 each by Does not apply route in the morning, at noon, and at bedtime. May substitute to any manufacturer covered by patient's insurance.   BLOOD GLUCOSE TEST STRIPS Strp 1 each by In Vitro route in the morning, at noon, and at bedtime. May substitute to any manufacturer covered by patient's insurance.   carvedilol 25 MG tablet Commonly known as: COREG Take 25 mg by mouth 2 (two) times daily with a meal.   cefdinir 300 MG capsule Commonly known as: OMNICEF Take 1 capsule (300 mg total) by mouth 2 (two) times daily for 7 days.   CertaVite/Antioxidants Tabs Take 1 tablet by mouth daily.   ciprofloxacin 500 MG tablet Commonly known as: CIPRO Take 1 tablet (500 mg total) by mouth 2 (two) times daily for 7 days.   dapagliflozin propanediol 10 MG Tabs tablet Commonly known as: FARXIGA Take 1 tablet (10 mg total) by mouth daily.   diclofenac Sodium 1 % Gel Commonly known as: VOLTAREN Apply 2 g topically 4 (four) times daily. To jaw for TMJ symptoms   fluticasone 50 MCG/ACT nasal spray Commonly known as: FLONASE Place 2 sprays into both nostrils daily. What changed:  when to take this reasons to take this   furosemide 40 MG tablet Commonly known as: LASIX Take 1 tablet (40 mg total) by mouth daily.   ketoconazole 2 % cream Commonly known as: NIZORAL Apply 1 Application topically daily. Apply 1gm to skin on plantar aspect of affected foot, and massage in well.  Apply once daily.   lidocaine 5 % Commonly known as: LIDODERM Place 2 patches onto the skin daily. Remove & Discard patch within 12 hours or as directed by MD   losartan 25 MG tablet Commonly known as: COZAAR Take 0.5 tablets (12.5 mg total) by mouth daily.   magnesium oxide 400 MG tablet Commonly known as: MAG-OX Take 1  tablet (400 mg total) by mouth at bedtime.   metFORMIN 500 MG 24 hr tablet Commonly known as: GLUCOPHAGE-XR Take 1 tablet (500 mg total) by mouth 2 (two) times daily with a meal.   methylphenidate 5 MG tablet Commonly known as: RITALIN Take 1 tablet (5 mg total) by mouth 2 (two) times daily with breakfast and lunch.   metoprolol succinate 50 MG 24 hr tablet Commonly known as: TOPROL-XL Take 1 tablet (50 mg total) by mouth daily. Take with or immediately following a meal.   NON FORMULARY Pt uses cpap nightly   nutrition supplement (JUVEN) Pack Take 1 packet by mouth 2 (two) times daily between meals.   Oxycodone HCl 10 MG Tabs Take 1 tablet (10 mg total) by mouth 3 (three) times daily as needed.  What changed:  how much to take when to take this reasons to take this   polyethylene glycol powder 17 GM/SCOOP powder Commonly known as: GLYCOLAX/MIRALAX Take 1 capful (17 g) in water 2 (two) times daily. What changed: when to take this   potassium chloride SA 20 MEQ tablet Commonly known as: KLOR-CON M Take 1 tablet (20 mEq total) by mouth daily.   Santyl 250 UNIT/GM ointment Generic drug: collagenase Apply 1 Application topically daily.   Senna-S 8.6-50 MG tablet Generic drug: senna-docusate Take 2 tablets by mouth at bedtime.   spironolactone 25 MG tablet Commonly known as: ALDACTONE Take 0.5 tablets (12.5 mg total) by mouth daily.   topiramate 25 MG tablet Commonly known as: TOPAMAX Take 1 tablet (25 mg total) by mouth at bedtime.   vortioxetine HBr 5 MG Tabs tablet Commonly known as: Trintellix Take 1 tablet (5 mg total) by mouth daily.       I had a long discussion with patient and patient's husband about dressing changes.  Discussed in length with husband what he needs to do for dressing changes.  I also discussed with them that our office did send over instructions to the patient's home health agency, a request for daily dressing changes by the home health  agency, as well as a request for home health physical therapy.  I did discuss the possibility of Lovenox with Dr. Ladona Ridgel.  We will not do Lovenox at this time.  I did discuss this with the patient.  I discussed with her the importance of ambulating.  Patient expressed understanding.  I also encouraged her to follow-up with infectious diseases soon as possible.  Patient's husband states that she has an appointment on 02/13/2023.  I will go ahead and send ciprofloxacin for another 7 days to cover her until she sees infectious disease.  They expressed understanding.  I also recommended that she follow-up with the pain management clinic that I referred her to a few weeks ago when she saw me in the office.  I instructed the patient and the patient's husband to call if they have any questions or concerns about anything before their appointment next week.    Follow-up Information     [facility] Follow up.   Specialty: Plastic Surgery Why: Please follow up with our clinic for your scheduled appointment next week Contact information: [address] [place] [address] [phone]                [facility] [address] [place], [place] [address] [phone]  Signed: [name] 02/07/2023, 3:41 PM

## 2023-02-08 DIAGNOSIS — I5032 Chronic diastolic (congestive) heart failure: Secondary | ICD-10-CM | POA: Diagnosis not present

## 2023-02-08 DIAGNOSIS — T8089XA Other complications following infusion, transfusion and therapeutic injection, initial encounter: Secondary | ICD-10-CM | POA: Diagnosis not present

## 2023-02-08 DIAGNOSIS — I152 Hypertension secondary to endocrine disorders: Secondary | ICD-10-CM | POA: Diagnosis not present

## 2023-02-08 DIAGNOSIS — I5023 Acute on chronic systolic (congestive) heart failure: Secondary | ICD-10-CM | POA: Diagnosis not present

## 2023-02-08 DIAGNOSIS — I96 Gangrene, not elsewhere classified: Secondary | ICD-10-CM | POA: Diagnosis not present

## 2023-02-08 DIAGNOSIS — T8189XA Other complications of procedures, not elsewhere classified, initial encounter: Secondary | ICD-10-CM | POA: Diagnosis not present

## 2023-02-09 DIAGNOSIS — I5023 Acute on chronic systolic (congestive) heart failure: Secondary | ICD-10-CM | POA: Diagnosis not present

## 2023-02-09 DIAGNOSIS — I152 Hypertension secondary to endocrine disorders: Secondary | ICD-10-CM | POA: Diagnosis not present

## 2023-02-09 DIAGNOSIS — I96 Gangrene, not elsewhere classified: Secondary | ICD-10-CM | POA: Diagnosis not present

## 2023-02-09 DIAGNOSIS — I5032 Chronic diastolic (congestive) heart failure: Secondary | ICD-10-CM | POA: Diagnosis not present

## 2023-02-09 DIAGNOSIS — T8089XA Other complications following infusion, transfusion and therapeutic injection, initial encounter: Secondary | ICD-10-CM | POA: Diagnosis not present

## 2023-02-09 DIAGNOSIS — T8189XA Other complications of procedures, not elsewhere classified, initial encounter: Secondary | ICD-10-CM | POA: Diagnosis not present

## 2023-02-10 ENCOUNTER — Other Ambulatory Visit: Payer: Self-pay | Admitting: Internal Medicine

## 2023-02-10 ENCOUNTER — Ambulatory Visit (INDEPENDENT_AMBULATORY_CARE_PROVIDER_SITE_OTHER): Payer: Medicare Other

## 2023-02-10 ENCOUNTER — Telehealth: Payer: Self-pay | Admitting: Internal Medicine

## 2023-02-10 VITALS — Ht 64.0 in | Wt 273.0 lb

## 2023-02-10 VITALS — Ht 64.0 in | Wt 273.8 lb

## 2023-02-10 DIAGNOSIS — I96 Gangrene, not elsewhere classified: Secondary | ICD-10-CM | POA: Diagnosis not present

## 2023-02-10 DIAGNOSIS — I5032 Chronic diastolic (congestive) heart failure: Secondary | ICD-10-CM | POA: Diagnosis not present

## 2023-02-10 DIAGNOSIS — T8089XA Other complications following infusion, transfusion and therapeutic injection, initial encounter: Secondary | ICD-10-CM | POA: Diagnosis not present

## 2023-02-10 DIAGNOSIS — I152 Hypertension secondary to endocrine disorders: Secondary | ICD-10-CM | POA: Diagnosis not present

## 2023-02-10 DIAGNOSIS — T8189XA Other complications of procedures, not elsewhere classified, initial encounter: Secondary | ICD-10-CM | POA: Diagnosis not present

## 2023-02-10 DIAGNOSIS — I5023 Acute on chronic systolic (congestive) heart failure: Secondary | ICD-10-CM | POA: Diagnosis not present

## 2023-02-10 DIAGNOSIS — Z0000 Encounter for general adult medical examination without abnormal findings: Secondary | ICD-10-CM | POA: Diagnosis not present

## 2023-02-10 MED ORDER — FLUCONAZOLE 150 MG PO TABS: 150.0000 mg | ORAL_TABLET | Freq: Once | ORAL | 3 refills | Status: AC

## 2023-02-10 NOTE — Progress Notes (Signed)
Because this visit was a virtual/telehealth visit,  certain criteria was not obtained, such a blood pressure, CBG if applicable, and timed get up and go. Any medications not marked as "taking" were not mentioned during the medication reconciliation part of the visit. Any vitals not documented were not able to be obtained due to this being a telehealth visit or patient was unable to self-report a recent blood pressure reading due to a lack of equipment at home via telehealth. Vitals that have been documented are verbally provided by the patient.   Subjective:   [name] is a 57 y.o. female who presents for Medicare Annual (Subsequent) preventive examination.  Visit Complete: Virtual I connected with  [name] on 02/10/23 by a audio enabled telemedicine application and verified that I am speaking with the correct person using two identifiers.  Patient Location: Home  Provider Location: Home Office  I discussed the limitations of evaluation and management by telemedicine. The patient expressed understanding and agreed to proceed.  Vital Signs: Because this visit was a virtual/telehealth visit, some criteria may be missing or patient reported. Any vitals not documented were not able to be obtained and vitals that have been documented are patient reported.  Patient Medicare AWV questionnaire was completed by the patient on na; I have confirmed that all information answered by patient is correct and no changes since this date.  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     02/10/2023    1:43 PM 02/10/2023    1:16 PM 02/07/2023    9:35 AM 12/17/2022    6:00 PM 12/06/2022    8:00 PM  05/10/2022    1:00 PM 05/02/2022   12:22 PM  Advanced Directives  Does Patient Have a Medical Advance Directive? No No No No No No No  Would patient like information on creating a medical advance directive? No - Patient declined No - Patient declined No - Patient declined No - Patient declined No - Guardian declined No - Patient declined Yes (MAU/Ambulatory/Procedural Areas - Information given)    Current Medications (verified) Outpatient Encounter Medications as of 02/10/2023  Medication Sig   acetaminophen (TYLENOL) 325 MG tablet Take 1-2 tablets (325-650 mg total) by mouth every 4 (four) hours as needed for mild pain (pain score 1-3).   ALPRAZolam (XANAX) 0.5 MG tablet Take 1 tablet (0.5 mg total) by mouth 2 (two) times daily as needed for anxiety (if anxiety unrelieved by hydroxyzine).   ascorbic acid (VITAMIN C) 500 MG tablet Take 1 tablet (500 mg total) by mouth daily. (Patient not taking: Reported on 02/05/2023)   atorvastatin (LIPITOR) 80 MG tablet Take 1 tablet (80 mg total) by mouth daily.   Blood Glucose Monitoring Suppl DEVI 1 each by Does not apply route in the morning, at noon, and at bedtime. May substitute to any manufacturer covered by patient's insurance.   carvedilol (COREG) 25 MG tablet Take 25 mg by mouth 2 (two) times daily with a meal. (Patient not taking: Reported on 02/05/2023)   cefdinir (OMNICEF) 300 MG capsule Take 1 capsule (300 mg total) by mouth 2 (two) times daily for 7 days.   ciprofloxacin (CIPRO) 500 MG tablet Take 1 tablet (  500 mg total) by mouth 2 (two) times daily for 7 days.   collagenase (SANTYL) 250 UNIT/GM ointment Apply 1 Application topically daily.   dapagliflozin propanediol (FARXIGA) 10 MG TABS tablet Take 1 tablet (10 mg total) by mouth daily.   diclofenac Sodium (VOLTAREN) 1 % GEL Apply 2 g topically 4 (four) times daily. To jaw for TMJ symptoms   fluticasone (FLONASE) 50 MCG/ACT nasal spray Place 2 sprays into both nostrils daily. (Patient taking  differently: Place 2 sprays into both nostrils daily as needed for rhinitis.)   furosemide (LASIX) 40 MG tablet Take 1 tablet (40 mg total) by mouth daily.   Glucose Blood (BLOOD GLUCOSE TEST STRIPS) STRP 1 each by In Vitro route in the morning, at noon, and at bedtime. May substitute to any manufacturer covered by patient's insurance.   ketoconazole (NIZORAL) 2 % cream Apply 1 Application topically daily. Apply 1gm to skin on plantar aspect of affected foot, and massage in well.  Apply once daily.   lidocaine (LIDODERM) 5 % Place 2 patches onto the skin daily. Remove & Discard patch within 12 hours or as directed by MD   losartan (COZAAR) 25 MG tablet Take 0.5 tablets (12.5 mg total) by mouth daily.   magnesium oxide (MAG-OX) 400 MG tablet Take 1 tablet (400 mg total) by mouth at bedtime.   metFORMIN (GLUCOPHAGE-XR) 500 MG 24 hr tablet Take 1 tablet (500 mg total) by mouth 2 (two) times daily with a meal.   methylphenidate (RITALIN) 5 MG tablet Take 1 tablet (5 mg total) by mouth 2 (two) times daily with breakfast and lunch.   metoprolol succinate (TOPROL-XL) 50 MG 24 hr tablet Take 1 tablet (50 mg total) by mouth daily. Take with or immediately following a meal.   Multiple Vitamin (MULTIVITAMIN WITH MINERALS) TABS tablet Take 1 tablet by mouth daily.   NON FORMULARY Pt uses cpap nightly   nutrition supplement, JUVEN, (JUVEN) PACK Take 1 packet by mouth 2 (two) times daily between meals.   Oxycodone HCl 10 MG TABS Take 1 tablet (10 mg total) by mouth 3 (three) times daily as needed. (Patient taking differently: Take 5-10 mg by mouth every 6 (six) hours as needed (pain).)   polyethylene glycol powder (GLYCOLAX/MIRALAX) 17 GM/SCOOP powder Take 1 capful (17 g) in water 2 (two) times daily. (Patient taking differently: Take 17 g by mouth daily.)   potassium chloride SA (KLOR-CON M) 20 MEQ tablet Take 1 tablet (20 mEq total) by mouth daily.   senna-docusate (SENOKOT-S) 8.6-50 MG tablet Take 2 tablets by  mouth at bedtime.   spironolactone (ALDACTONE) 25 MG tablet Take 0.5 tablets (12.5 mg total) by mouth daily.   topiramate (TOPAMAX) 25 MG tablet Take 1 tablet (25 mg total) by mouth at bedtime.   vortioxetine HBr (TRINTELLIX) 5 MG TABS tablet Take 1 tablet (5 mg total) by mouth daily.   No facility-administered encounter medications on file as of 02/10/2023.    Allergies (verified) Fluoxetine, Cymbalta [duloxetine hcl], Pork-derived products, and Penicillins   History: Past Medical History:  Diagnosis Date   ADD (attention deficit disorder)    Anxiety    Arthritis    both knees right worse than left   Carpal tunnel syndrome of right wrist    Depression    Essential hypertension, benign    Gallstones    History of smoking 06/22/2016   Hyperlipidemia associated with type 2 diabetes mellitus (HCC), on Zocor 04/04/2011   Hypertension associated with diabetes (HCC) 06/03/2008  Migraines    Morbid obesity (HCC)    Morbid obesity with BMI of 50.0-59.9, adult (HCC) 07/12/2006   NICM (nonischemic cardiomyopathy) (HCC) 07/12/2006   01/16/18 ECHO:    - Procedure narrative: Transthoracic echocardiography. Image   quality was suboptimal. The study was technically difficult.   Intravenous contrast (Definity) was administered. - Left ventricle: The cavity size was moderately dilated. Wall   thickness was increased in a pattern of mild LVH. Systolic   function was moderately to severely reduced. The estimated   ejection fraction w   Normal coronary arteries 04/17/2016   OSA on CPAP 10/28/2014   Spinal stenosis    back pain   Spondylosis without myelopathy or radiculopathy, lumbar region 12/04/2017   Type 2 diabetes mellitus with hyperglycemia [facility])    Past Surgical History:  Procedure Laterality Date   CHOLECYSTECTOMY     DILATATION & CURETTAGE/HYSTEROSCOPY WITH MYOSURE N/A 10/31/2017   Procedure: DILATATION & CURETTAGE/HYSTEROSCOPY WITH MYOSURE;  Surgeon: Romualdo Bolk, MD;   Location: [facility] ORS;  Service: Gynecology;  Laterality: N/A;   HYSTEROSCOPY WITH D & C N/A 05/07/2022   Procedure: DILATATION AND CURETTAGE /HYSTEROSCOPY;  Surgeon: [name], MD;  Location: [facility] OR;  Service: Gynecology;  Laterality: N/A;   INCISION AND DRAINAGE OF WOUND Left 02/06/2023   Procedure: debridement of left leg wound, application of myriad;  Surgeon: [name], MD;  Location: [facility] OR;  Service: Plastics;  Laterality: Left;   life vest     RIGHT/LEFT HEART CATH AND CORONARY ANGIOGRAPHY N/A 04/11/2016   Procedure: Right/Left Heart Cath and Coronary Angiography;  Surgeon: Kathleene Hazel, MD;  Location: [facility] INVASIVE CV LAB;  Service: Cardiovascular;  Laterality: N/A;   RIGHT/LEFT HEART CATH AND CORONARY ANGIOGRAPHY N/A 12/09/2022   Procedure: RIGHT/LEFT HEART CATH AND CORONARY ANGIOGRAPHY;  Surgeon: Yates Decamp, MD;  Location: [facility] INVASIVE CV LAB;  Service: Cardiovascular;  Laterality: N/A;   sonogram for blood clots     no blockages   Family History  Problem Relation Age of Onset   Depression Mother    Anxiety disorder Mother    Diabetes Mother    Hypertension Mother    ADD / ADHD Father    Diabetes Father    Hypertension Father    Colitis Father    Hypertension Other    Diabetes Other    Colitis Other    Alcohol abuse Other    Social History   Socioeconomic History   Marital status: Married    Spouse name: [name]   Number of children: 1   Years of education: Not on file   Highest education level: Associate degree: academic program  Occupational History   Not on file  Tobacco Use   Smoking status: Former    Current packs/day: 0.00    Types: Cigarettes    Quit date: 02/25/1997    Years since quitting: 25.9   Smokeless tobacco: Never   Tobacco comments:    Married, lives with spouse (when he is not traveling) and son  Vaping Use   Vaping status: Never Used  Substance and Sexual Activity   Alcohol use: No   Drug use: No   Sexual activity: Not  Currently    Partners: Male    Birth control/protection: None  Other Topics Concern   Not on file  Social History Narrative   Not on file   Social Drivers of Health   Financial Resource Strain: Low Risk  (02/10/2023)   Overall Physicist, medical Strain (  CARDIA)    Difficulty of Paying Living Expenses: Not hard at all  Food Insecurity: No Food Insecurity (02/10/2023)   Hunger Vital Sign    Worried About Running Out of Food in the Last Year: Never true    Ran Out of Food in the Last Year: Never true  Transportation Needs: No Transportation Needs (02/10/2023)   PRAPARE - Administrator, Civil Service (Medical): No    Lack of Transportation (Non-Medical): No  Physical Activity: Inactive (02/10/2023)   Exercise Vital Sign    Days of Exercise per Week: 0 days    Minutes of Exercise per Session: 0 min  Stress: Stress Concern Present (02/10/2023)   [employer] of Occupational Health - Occupational Stress Questionnaire    Feeling of Stress : Very much  Social Connections: Socially Isolated (02/10/2023)   Social Connection and Isolation Panel [NHANES]    Frequency of Communication with Friends and Family: Once a week    Frequency of Social Gatherings with Friends and Family: Once a week    Attends Religious Services: Never    Database administrator or Organizations: No    Attends Engineer, structural: Never    Marital Status: Married    Tobacco Counseling Counseling given: Not Answered Tobacco comments: Married, lives with spouse (when he is not traveling) and son   Clinical Intake:  Pre-visit preparation completed: Yes  Pain : 0-10 Pain Score: 6  Pain Type: Other (Comment) (surgical/wound pain LT leg) Pain Location: Leg Pain Orientation: Left Pain Descriptors / Indicators: Constant Pain Onset: More than a month ago Pain Frequency: Constant     BMI - recorded: 47 Nutritional Status: BMI > 30  Obese Nutritional Risks: Non-healing  wound Diabetes: Yes CBG done?: No Did pt. bring in CBG monitor from home?: No  How often do you need to have someone help you when you read instructions, pamphlets, or other written materials from your doctor or pharmacy?: 1 - Never  Interpreter Needed?: No  Information entered by :: Abby Ena Demary, CMA   Activities of Daily Living    02/10/2023    1:53 PM 02/10/2023    1:28 PM  In your present state of health, do you have any difficulty performing the following activities:  Hearing? 0 0  Vision? 0 0  Difficulty concentrating or making decisions? 0 0  Walking or climbing stairs? 1 1  Comment LT leg wound LT leg wound  Dressing or bathing? 0 0  Doing errands, shopping? 1 1  Comment LT leg wound LT leg wound  Preparing Food and eating ? Y N  Using the Toilet? Y Y  In the past six months, have you accidently leaked urine? N N  Do you have problems with loss of bowel control? N N  Managing your Medications? Y Y  Managing your Finances? Malvin Johns  Housekeeping or managing your Housekeeping? Malvin Johns    Patient Care Team: [name], MD as PCP - General (Internal Medicine) Romualdo Bolk, MD (Inactive) as PCP - OBGYN (Obstetrics and Gynecology) [name], MD as PCP - Cardiology (Cardiology) [name], MD as Consulting Physician (Cardiology) [facility], [facility] (Ophthalmology)  Indicate any recent Medical Services you may have received from other than [facility] providers in the past year (date may be approximate).     Assessment:   This is a routine wellness examination for [name].  Hearing/Vision screen Hearing Screening - Comments:: Patient denies any hearing difficulties.   Vision  Screening - Comments:: Patient is up to date with yearly eye exams    Goals Addressed             This Visit's Progress    Patient Stated   On track    To get better       Depression Screen    02/10/2023    1:25 PM 01/17/2023   12:01 PM 12/05/2022     1:31 PM 11/12/2022    2:08 PM 10/24/2022    6:10 PM 06/19/2022    4:24 PM 05/02/2022    9:06 AM  PHQ 2/9 Scores  PHQ - 2 Score 6 6  3  6 6   PHQ- 9 Score 17 15  10  21 20      Information is confidential and restricted. Go to Review Flowsheets to unlock data.    Fall Risk    02/10/2023    1:52 PM 02/10/2023    1:28 PM 08/08/2021    1:49 PM 06/29/2021    3:15 PM 05/16/2021    3:00 PM  Fall Risk   Falls in the past year? 1 1 0 -- --  Comment    denies new/ recent falls since last outreach 05/16/21; reports continues to use walker regularly denies new/ recent falls since last outreach 04/17/21; continues using rollator walker  Number falls in past yr: 0 1 0    Injury with Fall? 0 1 0    Risk for fall due to : History of fall(s);Impaired balance/gait;Orthopedic patient;Other (Comment) No Fall Risks;History of fall(s);Impaired balance/gait;Impaired mobility;Orthopedic patient;Other (Comment)   Impaired mobility;History of fall(s);Orthopedic patient;Medication side effect;Other (Comment)  Risk for fall due to: Comment non healing wound LT leg non healing wound LT leg   chronic pain  Follow up Education provided;Falls prevention discussed Falls prevention discussed Falls evaluation completed;Education provided;Falls prevention discussed Falls prevention discussed Falls prevention discussed    MEDICARE RISK AT HOME:    TIMED UP AND GO:  Was the test performed?  No    Cognitive Function:        02/10/2023    1:25 PM 08/08/2021    1:58 PM  6CIT Screen  What Year? 0 points 0 points  What month? 0 points 0 points  What time? 0 points 0 points  Count back from 20 0 points 0 points  Months in reverse 0 points 4 points  Repeat phrase 0 points 0 points  Total Score 0 points 4 points    Immunizations Immunization History  Administered Date(s) Administered   DTaP 11/02/1965, 04/28/1967, 09/05/1970, 10/09/1972   IPV 11/02/1965, 11/25/1966, 08/05/1969, 08/26/1969   Influenza Split 01/15/2011,  11/18/2011, 11/27/2013, 03/30/2015   Influenza, High Dose Seasonal PF 01/15/2011, 11/18/2011, 11/27/2013, 03/30/2015   Influenza, Seasonal, Injecte, Preservative Fre 01/15/2023   Influenza,inj,Quad PF,6+ Mos 11/27/2013, 03/30/2015, 12/29/2015, 01/13/2017, 11/04/2017, 01/08/2019, 02/10/2020, 03/01/2021, 02/07/2022   Influenza-Unspecified 01/09/2005, 11/27/2013, 03/30/2015   MMR 09/29/1997   Measles 09/12/1966   Mumps 11/01/1977   PFIZER(Purple Top)SARS-COV-2 Vaccination 09/24/2019, 10/29/2019, 07/11/2020   PNEUMOCOCCAL CONJUGATE-20 03/01/2021   Pneumococcal Polysaccharide-23 01/09/2005, 01/15/2011, 02/10/2020   Tdap 11/07/2017    TDAP status: Up to date  Flu Vaccine status: Up to date  Pneumococcal vaccine status: Not age appropriate for this patient.   Covid-19 vaccine status: Information provided on how to obtain vaccines.   Qualifies for Shingles Vaccine? Yes   Zostavax completed No   Shingrix Completed?: No.    Education has been provided regarding the importance of this vaccine. Patient has been  advised to call insurance company to determine out of pocket expense if they have not yet received this vaccine. Advised may also receive vaccine at local pharmacy or Health Dept. Verbalized acceptance and understanding.  Screening Tests Health Maintenance  Topic Date Due   COVID-19 Vaccine (4 - 2024-25 season) 10/27/2022   Zoster Vaccines- Shingrix (1 of 2) 04/18/2023 (Originally [birth year]1984)   FOOT EXAM  03/07/2023   HEMOGLOBIN A1C  06/07/2023   MAMMOGRAM  07/09/2023   OPHTHALMOLOGY EXAM  08/09/2023   Colonoscopy  02/11/2026   Cervical Cancer Screening (HPV/Pap Cotest)  08/02/2026   DTaP/Tdap/Td (6 - Td or Tdap) 11/08/2027   INFLUENZA VACCINE  Completed   Hepatitis C Screening  Completed   HIV Screening  Completed   HPV VACCINES  Aged Out    Health Maintenance  Health Maintenance Due  Topic Date Due   COVID-19 Vaccine (4 - 2024-25 season) 10/27/2022    Colorectal cancer  screening: Type of screening: Colonoscopy. Completed 02/12/2016. Repeat every 10 years  Mammogram status: Completed 07/09/2022. Repeat every year  Bone Density Screening: Not age appropriate for this patient.   Lung Cancer Screening: (Low Dose CT Chest recommended if Age 87-80 years, 20 pack-year currently smoking OR have quit w/in 15years.) does not qualify.   Lung Cancer Screening Referral: na  Additional Screening:  Hepatitis C Screening: does not qualify; Completed   Vision Screening: Recommended annual ophthalmology exams for early detection of glaucoma and other disorders of the eye. Is the patient up to date with their annual eye exam?  Yes  Who is the provider or what is the name of the office in which the patient attends annual eye exams? [facility] If pt is not established with a provider, would they like to be referred to a provider to establish care? No .   Dental Screening: Recommended annual dental exams for proper oral hygiene  Diabetic Foot Exam: Diabetic Foot Exam: Completed 03/06/2022  Community Resource Referral / Chronic Care Management: CRR required this visit?  No   CCM required this visit?  No     Plan:     I have personally reviewed and noted the following in the patient's chart:   Medical and social history Use of alcohol, tobacco or illicit drugs  Current medications and supplements including opioid prescriptions. Patient is currently taking opioid prescriptions. Information provided to patient regarding non-opioid alternatives. Patient advised to discuss non-opioid treatment plan with their provider. Functional ability and status Nutritional status Physical activity Advanced directives List of other physicians Hospitalizations, surgeries, and ER visits in previous 12 months Vitals Screenings to include cognitive, depression, and falls Referrals and appointments  In addition, I have reviewed and discussed with patient  certain preventive protocols, quality metrics, and best practice recommendations. A written personalized care plan for preventive services as well as general preventive health recommendations were provided to patient.     Jordan Hawks Rotunda Worden, CMA   02/10/2023   After Visit Summary: (MyChart) Due to this being a telephonic visit, the after visit summary with patients personalized plan was offered to patient via MyChart   Nurse Notes: none

## 2023-02-10 NOTE — Telephone Encounter (Signed)
Please advise 

## 2023-02-10 NOTE — Patient Instructions (Signed)
Ms. [name] , Thank you for taking time to come for your Medicare Wellness Visit. I appreciate your ongoing commitment to your health goals. Please review the following plan we discussed and let me know if I can assist you in the future.   Referrals/Orders/Follow-Ups/Clinician Recommendations:  Next Medicare Annual Wellness Visit: February 11, 2024 at 8:10 am virtual visit  Wishing you a speedy recovery  This is a list of the screening recommended for you and due dates:  Health Maintenance  Topic Date Due   COVID-19 Vaccine (4 - 2024-25 season) 10/27/2022   Zoster (Shingles) Vaccine (1 of 2) 04/18/2023*   Complete foot exam   03/07/2023   Hemoglobin A1C  06/07/2023   Mammogram  07/09/2023   Eye exam for diabetics  08/09/2023   Colon Cancer Screening  02/11/2026   Pap with HPV screening  08/02/2026   DTaP/Tdap/Td vaccine (6 - Td or Tdap) 11/08/2027   Flu Shot  Completed   Hepatitis C Screening  Completed   HIV Screening  Completed   HPV Vaccine  Aged Out  *Topic was postponed. The date shown is not the original due date.    Advanced directives: (Declined) Advance directive discussed with you today. Even though you declined this today, please call our office should you change your mind, and we can give you the proper paperwork for you to fill out.  Next Medicare Annual Wellness Visit scheduled for next year: Yes  Preventive Care 57-39 Years Old, Female Preventive care refers to lifestyle choices and visits with your health care provider that can promote health and wellness. Preventive care visits are also called wellness exams. What can I expect for my preventive care visit? Counseling Your health care provider may ask you questions about your: Medical history, including: Past medical problems. Family medical history. Pregnancy history. Current health, including: Menstrual cycle. Method of birth control. Emotional well-being. Home life and relationship well-being. Sexual  activity and sexual health. Lifestyle, including: Alcohol, nicotine or tobacco, and drug use. Access to firearms. Diet, exercise, and sleep habits. Work and work Astronomer. Sunscreen use. Safety issues such as seatbelt and bike helmet use. Physical exam Your health care provider will check your: Height and weight. These may be used to calculate your BMI (body mass index). BMI is a measurement that tells if you are at a healthy weight. Waist circumference. This measures the distance around your waistline. This measurement also tells if you are at a healthy weight and may help predict your risk of certain diseases, such as type 2 diabetes and high blood pressure. Heart rate and blood pressure. Body temperature. Skin for abnormal spots. What immunizations do I need?  Vaccines are usually given at various ages, according to a schedule. Your health care provider will recommend vaccines for you based on your age, medical history, and lifestyle or other factors, such as travel or where you work. What tests do I need? Screening Your health care provider may recommend screening tests for certain conditions. This may include: Lipid and cholesterol levels. Diabetes screening. This is done by checking your blood sugar (glucose) after you have not eaten for a while (fasting). Pelvic exam and Pap test. Hepatitis B test. Hepatitis C test. HIV (human immunodeficiency virus) test. STI (sexually transmitted infection) testing, if you are at risk. Lung cancer screening. Colorectal cancer screening. Mammogram. Talk with your health care provider about when you should start having regular mammograms. This may depend on whether you have a family history of breast cancer.  BRCA-related cancer screening. This may be done if you have a family history of breast, ovarian, tubal, or peritoneal cancers. Bone density scan. This is done to screen for osteoporosis. Talk with your health care provider about your  test results, treatment options, and if necessary, the need for more tests. Follow these instructions at home: Eating and drinking  Eat a diet that includes fresh fruits and vegetables, whole grains, lean protein, and low-fat dairy products. Take vitamin and mineral supplements as recommended by your health care provider. Do not drink alcohol if: Your health care provider tells you not to drink. You are pregnant, may be pregnant, or are planning to become pregnant. If you drink alcohol: Limit how much you have to 0-1 drink a day. Know how much alcohol is in your drink. In the [place] one drink equals one 12 oz bottle of beer (355 mL), one 5 oz glass of wine (148 mL), or one 1 oz glass of hard liquor (44 mL). Lifestyle Brush your teeth every morning and night with fluoride toothpaste. Floss one time each day. Exercise for at least 30 minutes 5 or more days each week. Do not use any products that contain nicotine or tobacco. These products include cigarettes, chewing tobacco, and vaping devices, such as e-cigarettes. If you need help quitting, ask your health care provider. Do not use drugs. If you are sexually active, practice safe sex. Use a condom or other form of protection to prevent STIs. If you do not wish to become pregnant, use a form of birth control. If you plan to become pregnant, see your health care provider for a prepregnancy visit. Take aspirin only as told by your health care provider. Make sure that you understand how much to take and what form to take. Work with your health care provider to find out whether it is safe and beneficial for you to take aspirin daily. Find healthy ways to manage stress, such as: Meditation, yoga, or listening to music. Journaling. Talking to a trusted person. Spending time with friends and family. Minimize exposure to UV radiation to reduce your risk of skin cancer. Safety Always wear your seat belt while driving or riding in a vehicle. Do not  drive: If you have been drinking alcohol. Do not ride with someone who has been drinking. When you are tired or distracted. While texting. If you have been using any mind-altering substances or drugs. Wear a helmet and other protective equipment during sports activities. If you have firearms in your house, make sure you follow all gun safety procedures. Seek help if you have been physically or sexually abused. What's next? Visit your health care provider once a year for an annual wellness visit. Ask your health care provider how often you should have your eyes and teeth checked. Stay up to date on all vaccines. This information is not intended to replace advice given to you by your health care provider. Make sure you discuss any questions you have with your health care provider. Document Revised: 08/09/2020 Document Reviewed: 08/09/2020 [employer] Patient Education  2024 [employer].

## 2023-02-10 NOTE — Progress Notes (Cosign Needed)
 Because this visit was a virtual/telehealth visit,  certain criteria was not obtained, such a blood pressure, CBG if applicable, and timed get up and go. Any medications not marked as "taking" were not mentioned during the medication reconciliation part of the visit. Any vitals not documented were not able to be obtained due to this being a telehealth visit or patient was unable to self-report a recent blood pressure reading due to a lack of equipment at home via telehealth. Vitals that have been documented are verbally provided by the patient.   Subjective:   [name] is a 57 y.o. female who presents for Medicare Annual (Subsequent) preventive examination.  Visit Complete: Virtual I connected with  Klynn Loge on 02/10/23 by a audio enabled telemedicine application and verified that I am speaking with the correct person using two identifiers.  Patient Location: Home  Provider Location: Home Office  I discussed the limitations of evaluation and management by telemedicine. The patient expressed understanding and agreed to proceed.  Vital Signs: Because this visit was a virtual/telehealth visit, some criteria may be missing or patient reported. Any vitals not documented were not able to be obtained and vitals that have been documented are patient reported.  Patient Medicare AWV questionnaire was completed by the patient on na; I have confirmed that all information answered by patient is correct and no changes since this date.  Cardiac Risk Factors include: advanced age (>21men, >73 women);diabetes mellitus;dyslipidemia;hypertension;sedentary lifestyle;obesity (BMI >30kg/m2);Other (see comment), Risk factor comments: hx of cardiac arrest October 2024     Objective:    Today's Vitals   02/10/23 1316 02/10/23 1322  Weight: 273 lb (123.8 kg)   Height: 5\' 4"  (1.626 m)   PainSc:  6    Body mass index is 46.86 kg/m.     02/10/2023    1:16 PM 02/07/2023    9:35 AM 12/17/2022    6:00  PM 12/06/2022    8:00 PM 05/10/2022    1:00 PM 05/02/2022   12:22 PM 03/29/2022    9:33 PM  Advanced Directives  Does Patient Have a Medical Advance Directive? No No No No No No No  Would patient like information on creating a medical advance directive? No - Patient declined No - Patient declined No - Patient declined No - Guardian declined No - Patient declined Yes (MAU/Ambulatory/Procedural Areas - Information given) No - Patient declined    Current Medications (verified) Outpatient Encounter Medications as of 02/10/2023  Medication Sig   acetaminophen (TYLENOL) 325 MG tablet Take 1-2 tablets (325-650 mg total) by mouth every 4 (four) hours as needed for mild pain (pain score 1-3).   ALPRAZolam (XANAX) 0.5 MG tablet Take 1 tablet (0.5 mg total) by mouth 2 (two) times daily as needed for anxiety (if anxiety unrelieved by hydroxyzine).   atorvastatin (LIPITOR) 80 MG tablet Take 1 tablet (80 mg total) by mouth daily.   Blood Glucose Monitoring Suppl DEVI 1 each by Does not apply route in the morning, at noon, and at bedtime. May substitute to any manufacturer covered by patient's insurance.   ciprofloxacin (CIPRO) 500 MG tablet Take 1 tablet (500 mg total) by mouth 2 (two) times daily for 7 days.   collagenase (SANTYL) 250 UNIT/GM ointment Apply 1 Application topically daily.   dapagliflozin propanediol (FARXIGA) 10 MG TABS tablet Take 1 tablet (10 mg total) by mouth daily.   diclofenac Sodium (VOLTAREN) 1 % GEL Apply 2 g topically 4 (four) times daily. To jaw for TMJ symptoms  fluticasone (FLONASE) 50 MCG/ACT nasal spray Place 2 sprays into both nostrils daily. (Patient taking differently: Place 2 sprays into both nostrils daily as needed for rhinitis.)   furosemide (LASIX) 40 MG tablet Take 1 tablet (40 mg total) by mouth daily.   Glucose Blood (BLOOD GLUCOSE TEST STRIPS) STRP 1 each by In Vitro route in the morning, at noon, and at bedtime. May substitute to any manufacturer covered by patient's  insurance.   ketoconazole (NIZORAL) 2 % cream Apply 1 Application topically daily. Apply 1gm to skin on plantar aspect of affected foot, and massage in well.  Apply once daily.   lidocaine (LIDODERM) 5 % Place 2 patches onto the skin daily. Remove & Discard patch within 12 hours or as directed by MD   losartan (COZAAR) 25 MG tablet Take 0.5 tablets (12.5 mg total) by mouth daily.   magnesium oxide (MAG-OX) 400 MG tablet Take 1 tablet (400 mg total) by mouth at bedtime.   metFORMIN (GLUCOPHAGE-XR) 500 MG 24 hr tablet Take 1 tablet (500 mg total) by mouth 2 (two) times daily with a meal.   methylphenidate (RITALIN) 5 MG tablet Take 1 tablet (5 mg total) by mouth 2 (two) times daily with breakfast and lunch.   metoprolol succinate (TOPROL-XL) 50 MG 24 hr tablet Take 1 tablet (50 mg total) by mouth daily. Take with or immediately following a meal.   Multiple Vitamin (MULTIVITAMIN WITH MINERALS) TABS tablet Take 1 tablet by mouth daily.   NON FORMULARY Pt uses cpap nightly   nutrition supplement, JUVEN, (JUVEN) PACK Take 1 packet by mouth 2 (two) times daily between meals.   Oxycodone HCl 10 MG TABS Take 1 tablet (10 mg total) by mouth 3 (three) times daily as needed. (Patient taking differently: Take 5-10 mg by mouth every 6 (six) hours as needed (pain).)   polyethylene glycol powder (GLYCOLAX/MIRALAX) 17 GM/SCOOP powder Take 1 capful (17 g) in water 2 (two) times daily. (Patient taking differently: Take 17 g by mouth daily.)   potassium chloride SA (KLOR-CON M) 20 MEQ tablet Take 1 tablet (20 mEq total) by mouth daily.   senna-docusate (SENOKOT-S) 8.6-50 MG tablet Take 2 tablets by mouth at bedtime.   spironolactone (ALDACTONE) 25 MG tablet Take 0.5 tablets (12.5 mg total) by mouth daily.   topiramate (TOPAMAX) 25 MG tablet Take 1 tablet (25 mg total) by mouth at bedtime.   vortioxetine HBr (TRINTELLIX) 5 MG TABS tablet Take 1 tablet (5 mg total) by mouth daily.   ascorbic acid (VITAMIN C) 500 MG  tablet Take 1 tablet (500 mg total) by mouth daily. (Patient not taking: Reported on 02/05/2023)   carvedilol (COREG) 25 MG tablet Take 25 mg by mouth 2 (two) times daily with a meal. (Patient not taking: Reported on 02/05/2023)   cefdinir (OMNICEF) 300 MG capsule Take 1 capsule (300 mg total) by mouth 2 (two) times daily for 7 days.   No facility-administered encounter medications on file as of 02/10/2023.    Allergies (verified) Fluoxetine, Cymbalta [duloxetine hcl], Pork-derived products, and Penicillins   History: Past Medical History:  Diagnosis Date   ADD (attention deficit disorder)    Anxiety    Arthritis    both knees right worse than left   Carpal tunnel syndrome of right wrist    Depression    Essential hypertension, benign    Gallstones    History of smoking 06/22/2016   Hyperlipidemia associated with type 2 diabetes mellitus (HCC), on Zocor 04/04/2011  Hypertension associated with diabetes (HCC) 06/03/2008   Migraines    Morbid obesity (HCC)    Morbid obesity with BMI of 50.0-59.9, adult (HCC) 07/12/2006   NICM (nonischemic cardiomyopathy) (HCC) 07/12/2006   01/16/18 ECHO:    - Procedure narrative: Transthoracic echocardiography. Image   quality was suboptimal. The study was technically difficult.   Intravenous contrast (Definity) was administered. - Left ventricle: The cavity size was moderately dilated. Wall   thickness was increased in a pattern of mild LVH. Systolic   function was moderately to severely reduced. The estimated   ejection fraction w   Normal coronary arteries 04/17/2016   OSA on CPAP 10/28/2014   Spinal stenosis    back pain   Spondylosis without myelopathy or radiculopathy, lumbar region 12/04/2017   Type 2 diabetes mellitus with hyperglycemia [facility])    Past Surgical History:  Procedure Laterality Date   CHOLECYSTECTOMY     DILATATION & CURETTAGE/HYSTEROSCOPY WITH MYOSURE N/A 10/31/2017   Procedure: DILATATION & CURETTAGE/HYSTEROSCOPY WITH  MYOSURE;  Surgeon: Romualdo Bolk, MD;  Location: [facility] ORS;  Service: Gynecology;  Laterality: N/A;   HYSTEROSCOPY WITH D & C N/A 05/07/2022   Procedure: DILATATION AND CURETTAGE /HYSTEROSCOPY;  Surgeon: [name], MD;  Location: [facility] OR;  Service: Gynecology;  Laterality: N/A;   INCISION AND DRAINAGE OF WOUND Left 02/06/2023   Procedure: debridement of left leg wound, application of myriad;  Surgeon: [name], MD;  Location[facility];  Service: Plastics;  Laterality: Left;   life vest     RIGHT/LEFT HEART CATH AND CORONARY ANGIOGRAPHY N/A 04/11/2016   Procedure: Right/Left Heart Cath and Coronary Angiography;  Surgeon: Kathleene Hazel, MD;  Location: [facility] INVASIVE CV LAB;  Service: Cardiovascular;  Laterality: N/A;   RIGHT/LEFT HEART CATH AND CORONARY ANGIOGRAPHY N/A 12/09/2022   Procedure: RIGHT/LEFT HEART CATH AND CORONARY ANGIOGRAPHY;  Surgeon: Yates Decamp, MD;  Location: [facility] INVASIVE CV LAB;  Service: Cardiovascular;  Laterality: N/A;   sonogram for blood clots     no blockages   Family History  Problem Relation Age of Onset   Depression Mother    Anxiety disorder Mother    Diabetes Mother    Hypertension Mother    ADD / ADHD Father    Diabetes Father    Hypertension Father    Colitis Father    Hypertension Other    Diabetes Other    Colitis Other    Alcohol abuse Other    Social History   Socioeconomic History   Marital status: Married    Spouse name: [name]   Number of children: 1   Years of education: Not on file   Highest education level: Associate degree: academic program  Occupational History   Not on file  Tobacco Use   Smoking status: Former    Current packs/day: 0.00    Types: Cigarettes    Quit date: 02/25/1997    Years since quitting: 25.9   Smokeless tobacco: Never   Tobacco comments:    Married, lives with spouse (when he is not traveling) and son  Vaping Use   Vaping status: Never Used  Substance and Sexual Activity   Alcohol use:  No   Drug use: No   Sexual activity: Not Currently    Partners: Male    Birth control/protection: None  Other Topics Concern   Not on file  Social History Narrative   Not on file   Social Drivers of Health   Financial Resource Strain: Low Risk  (  02/10/2023)   Overall Financial Resource Strain (CARDIA)    Difficulty of Paying Living Expenses: Not hard at all  Food Insecurity: No Food Insecurity (02/10/2023)   Hunger Vital Sign    Worried About Running Out of Food in the Last Year: Never true    Ran Out of Food in the Last Year: Never true  Transportation Needs: No Transportation Needs (02/10/2023)   PRAPARE - Administrator, Civil Service (Medical): No    Lack of Transportation (Non-Medical): No  Physical Activity: Inactive (02/10/2023)   Exercise Vital Sign    Days of Exercise per Week: 0 days    Minutes of Exercise per Session: 0 min  Stress: Stress Concern Present (02/10/2023)   [employer] of Occupational Health - Occupational Stress Questionnaire    Feeling of Stress : Very much  Social Connections: Socially Isolated (02/10/2023)   Social Connection and Isolation Panel [NHANES]    Frequency of Communication with Friends and Family: Once a week    Frequency of Social Gatherings with Friends and Family: Once a week    Attends Religious Services: Never    Database administrator or Organizations: No    Attends Engineer, structural: Never    Marital Status: Married    Tobacco Counseling Counseling given: Not Answered Tobacco comments: Married, lives with spouse (when he is not traveling) and son   Clinical Intake:  Pre-visit preparation completed: Yes  Pain : 0-10 Pain Score: 6  Pain Type: Other (Comment) (surgical pain/LT wound pain) Pain Location: Leg Pain Orientation: Left Pain Descriptors / Indicators: Constant Pain Onset: 1 to 4 weeks ago Pain Frequency: Constant     BMI - recorded: 46.86 Nutritional Status: BMI > 30   Obese Nutritional Risks: Non-healing wound Diabetes: Yes CBG done?: No Did pt. bring in CBG monitor from home?: No  How often do you need to have someone help you when you read instructions, pamphlets, or other written materials from your doctor or pharmacy?: 1 - Never  Interpreter Needed?: No  Information entered by :: Abby Ruhi Kopke, CMA   Activities of Daily Living    02/10/2023    1:28 PM 02/06/2023    1:42 PM  In your present state of health, do you have any difficulty performing the following activities:  Hearing? 0   Vision? 0   Difficulty concentrating or making decisions? 0   Walking or climbing stairs? 1   Comment LT leg wound   Dressing or bathing? 0   Doing errands, shopping? 1 0  Comment LT leg wound   Preparing Food and eating ? N   Using the Toilet? Y   In the past six months, have you accidently leaked urine? N   Do you have problems with loss of bowel control? N   Managing your Medications? Y   Managing your Finances? Y   Housekeeping or managing your Housekeeping? Y     Patient Care Team: [name], MD as PCP - General (Internal Medicine) Romualdo Bolk, MD (Inactive) as PCP - OBGYN (Obstetrics and Gynecology) [name], MD as PCP - Cardiology (Cardiology) [name], MD as Consulting Physician (Cardiology)  Indicate any recent Medical Services you may have received from other than [facility] providers in the past year (date may be approximate).     Assessment:   This is a routine wellness examination for Karema.  Hearing/Vision screen Hearing Screening - Comments:: Patient denies any hearing difficulties.     Goals  Addressed             This Visit's Progress    Patient Stated       To get better       Depression Screen    02/10/2023    1:25 PM 01/17/2023   12:01 PM 12/05/2022    1:31 PM 11/12/2022    2:08 PM 10/24/2022    6:10 PM 06/19/2022    4:24 PM 05/02/2022    9:06 AM  PHQ 2/9 Scores  PHQ - 2 Score 6 6  3  6  6   PHQ- 9 Score 17 15  10  21 20      Information is confidential and restricted. Go to Review Flowsheets to unlock data.    Fall Risk    02/10/2023    1:28 PM 08/08/2021    1:49 PM 06/29/2021    3:15 PM 05/16/2021    3:00 PM 03/13/2021   10:30 AM  Fall Risk   Falls in the past year? 1 0 -- -- 0  Comment   denies new/ recent falls since last outreach 05/16/21; reports continues to use walker regularly denies new/ recent falls since last outreach 04/17/21; continues using rollator walker   Number falls in past yr: 1 0   0  Injury with Fall? 1 0   0  Comment     N/A- no falls reported  Risk for fall due to : No Fall Risks;History of fall(s);Impaired balance/gait;Impaired mobility;Orthopedic patient;Other (Comment)   Impaired mobility;History of fall(s);Orthopedic patient;Medication side effect;Other (Comment) Other (Comment)  Risk for fall due to: Comment non healing wound LT leg   chronic pain chronic pain- back  Follow up Falls prevention discussed Falls evaluation completed;Education provided;Falls prevention discussed Falls prevention discussed Falls prevention discussed Falls prevention discussed;Education provided  Comment     does not currently use assisitve devices- requesting rollator walker- will message PCP    MEDICARE RISK AT HOME: Medicare Risk at Home Any stairs in or around the home?: Yes If so, are there any without handrails?: No Home free of loose throw rugs in walkways, pet beds, electrical cords, etc?: Yes Adequate lighting in your home to reduce risk of falls?: Yes Life alert?: No Use of a cane, walker or w/c?: Yes Grab bars in the bathroom?: Yes Shower chair or bench in shower?: Yes Elevated toilet seat or a handicapped toilet?: No  TIMED UP AND GO:  Was the test performed?  No    Cognitive Function:        02/10/2023    1:25 PM 08/08/2021    1:58 PM  6CIT Screen  What Year? 0 points 0 points  What month? 0 points 0 points  What time? 0 points 0 points   Count back from 20 0 points 0 points  Months in reverse 0 points 4 points  Repeat phrase 0 points 0 points  Total Score 0 points 4 points    Immunizations Immunization History  Administered Date(s) Administered   DTaP 11/02/1965, 04/28/1967, 09/05/1970, 10/09/1972   IPV 11/02/1965, 11/25/1966, 08/05/1969, 08/26/1969   Influenza Split 01/15/2011, 11/18/2011, 11/27/2013, 03/30/2015   Influenza, High Dose Seasonal PF 01/15/2011, 11/18/2011, 11/27/2013, 03/30/2015   Influenza, Seasonal, Injecte, Preservative Fre 01/15/2023   Influenza,inj,Quad PF,6+ Mos 11/27/2013, 03/30/2015, 12/29/2015, 01/13/2017, 11/04/2017, 01/08/2019, 02/10/2020, 03/01/2021, 02/07/2022   Influenza-Unspecified 01/09/2005, 11/27/2013, 03/30/2015   MMR 09/29/1997   Measles 09/12/1966   Mumps 11/01/1977   PFIZER(Purple Top)SARS-COV-2 Vaccination 09/24/2019, 10/29/2019, 07/11/2020   PNEUMOCOCCAL CONJUGATE-20 03/01/2021  Pneumococcal Polysaccharide-23 01/09/2005, 01/15/2011, 02/10/2020   Tdap 11/07/2017    TDAP status: Up to date  Flu Vaccine status: Up to date  Pneumococcal vaccine status: Not age appropriate for this patient.    Covid-19 vaccine status: Information provided on how to obtain vaccines.   Qualifies for Shingles Vaccine? Yes   Zostavax completed No   Shingrix Completed?: No.    Education has been provided regarding the importance of this vaccine. Patient has been advised to call insurance company to determine out of pocket expense if they have not yet received this vaccine. Advised may also receive vaccine at local pharmacy or Health Dept. Verbalized acceptance and understanding.  Screening Tests Health Maintenance  Topic Date Due   COVID-19 Vaccine (4 - 2024-25 season) 10/27/2022   Zoster Vaccines- Shingrix (1 of 2) 04/18/2023 (Originally [birth year]1984)   FOOT EXAM  03/07/2023   HEMOGLOBIN A1C  06/07/2023   OPHTHALMOLOGY EXAM  08/09/2023   MAMMOGRAM  07/08/2024   Colonoscopy  02/11/2026    Cervical Cancer Screening (HPV/Pap Cotest)  08/02/2026   DTaP/Tdap/Td (6 - Td or Tdap) 11/08/2027   INFLUENZA VACCINE  Completed   Hepatitis C Screening  Completed   HIV Screening  Completed   HPV VACCINES  Aged Out    Health Maintenance  Health Maintenance Due  Topic Date Due   COVID-19 Vaccine (4 - 2024-25 season) 10/27/2022    Colorectal cancer screening: Type of screening: Colonoscopy. Completed 02/12/2016. Repeat every 10 years  Mammogram status: Completed 07/09/2022. Repeat every year  Bone Density Screening: Not age appropriate for this patient.   Lung Cancer Screening: (Low Dose CT Chest recommended if Age 19-80 years, 20 pack-year currently smoking OR have quit w/in 15years.) does not qualify.   Lung Cancer Screening Referral: na  Additional Screening:  Hepatitis C Screening: does not qualify; Completed   Vision Screening: Recommended annual ophthalmology exams for early detection of glaucoma and other disorders of the eye. Is the patient up to date with their annual eye exam?  Yes  Who is the provider or what is the name of the office in which the patient attends annual eye exams? Patient can't recall name of provider If pt is not established with a provider, would they like to be referred to a provider to establish care? No .   Dental Screening: Recommended annual dental exams for proper oral hygiene  Diabetic Foot Exam: Diabetic Foot Exam: Completed 03/06/2022  Community Resource Referral / Chronic Care Management: CRR required this visit?  No   CCM required this visit?  No     Plan:     I have personally reviewed and noted the following in the patient's chart:   Medical and social history Use of alcohol, tobacco or illicit drugs  Current medications and supplements including opioid prescriptions. Patient is currently taking opioid prescriptions. Information provided to patient regarding non-opioid alternatives. Patient advised to discuss non-opioid  treatment plan with their provider. Functional ability and status Nutritional status Physical activity Advanced directives List of other physicians Hospitalizations, surgeries, and ER visits in previous 12 months Vitals Screenings to include cognitive, depression, and falls Referrals and appointments  In addition, I have reviewed and discussed with patient certain preventive protocols, quality metrics, and best practice recommendations. A written personalized care plan for preventive services as well as general preventive health recommendations were provided to patient.     Jordan Hawks Haddon Fyfe, CMA   02/10/2023   After Visit Summary: (MyChart) Due to this being a  telephonic visit, the after visit summary with patients personalized plan was offered to patient via MyChart   Nurse Notes: none

## 2023-02-10 NOTE — Telephone Encounter (Signed)
Pt is calling stating that she has a yeast infection and she came once before for a uti and was prescribed meds for that but now she was wondering if she could get something for the new infection if possible or do you want her to be seen again? If something can be prescribed please send to the pharmacy; [id number] [place], [facility] - [address] [place], [place] [address] Phone: [phone]  Fax: [phone]  CB # [phone]  PLEASE ADVISE,  Thanks

## 2023-02-11 ENCOUNTER — Encounter: Payer: Medicare Other | Admitting: Physician Assistant

## 2023-02-12 ENCOUNTER — Ambulatory Visit: Payer: Medicare Other | Admitting: Student

## 2023-02-12 ENCOUNTER — Ambulatory Visit: Payer: Medicare Other | Admitting: Internal Medicine

## 2023-02-12 ENCOUNTER — Telehealth: Payer: Self-pay | Admitting: *Deleted

## 2023-02-12 LAB — AEROBIC/ANAEROBIC CULTURE W GRAM STAIN (SURGICAL/DEEP WOUND): Gram Stain: NONE SEEN

## 2023-02-12 NOTE — Telephone Encounter (Addendum)
Received on (02/07/2023) via of fax Physician Orders from [facility].  Requesting signature,date, and return.  Given to provider to complete.  Physician Orders signed,dated,and faxed back to [facility].  Confirmation received and copy scanned into the chart.//AB/CMA

## 2023-02-13 ENCOUNTER — Ambulatory Visit: Payer: Medicare Other | Admitting: Infectious Diseases

## 2023-02-13 ENCOUNTER — Ambulatory Visit: Payer: Medicare Other | Admitting: Student

## 2023-02-13 ENCOUNTER — Encounter: Payer: Self-pay | Admitting: Infectious Diseases

## 2023-02-13 ENCOUNTER — Other Ambulatory Visit: Payer: Self-pay

## 2023-02-13 VITALS — BP 129/91 | HR 92 | Ht 64.0 in | Wt 272.4 lb

## 2023-02-13 VITALS — BP 130/80 | HR 94 | Resp 16 | Ht 64.0 in | Wt 272.0 lb

## 2023-02-13 DIAGNOSIS — Z880 Allergy status to penicillin: Secondary | ICD-10-CM

## 2023-02-13 DIAGNOSIS — Z79899 Other long term (current) drug therapy: Secondary | ICD-10-CM | POA: Insufficient documentation

## 2023-02-13 DIAGNOSIS — S81802D Unspecified open wound, left lower leg, subsequent encounter: Secondary | ICD-10-CM | POA: Diagnosis not present

## 2023-02-13 DIAGNOSIS — Z6841 Body Mass Index (BMI) 40.0 and over, adult: Secondary | ICD-10-CM

## 2023-02-13 DIAGNOSIS — S81802A Unspecified open wound, left lower leg, initial encounter: Secondary | ICD-10-CM | POA: Diagnosis not present

## 2023-02-13 MED ORDER — LINEZOLID 600 MG PO TABS: 600.0000 mg | ORAL_TABLET | Freq: Two times a day (BID) | ORAL | 0 refills | Status: DC

## 2023-02-13 NOTE — Progress Notes (Signed)
   Referring Provider [name], MD [address] [place],  [place] [address]   CC:  Chief Complaint  Patient presents with   Routine Post Op      [name] is an 57 y.o. female.  HPI: Patient is a 57 year old female with history of a large wound to her left lower extremity.  She recently underwent debridement of the left lower extremity wound with placement of myriad tissue matrix with Dr. Ladona Ridgel on 02/06/2023.  She is 1 week postop.  She presents to the clinic today for postoperative follow-up.  Today, patient presents with her husband at bedside.  She reports she is doing okay.  She states that the knee immobilizer is bothering her a lot.  She states that it is affecting her mobility as she feels it is too long for her leg.  She states that it is also causing discomfort.  She otherwise states she is doing fairly well and that home health care has, a few times so far.  Husband reports he has been doing dressing changes since then.  Review of Systems General: No fevers or chills  Physical Exam    02/13/2023    9:44 AM 02/10/2023    1:44 PM 02/10/2023    1:16 PM  Vitals with BMI  Height 5\' 4"  5\' 4"  5\' 4"   Weight 272 lbs 6 oz 273 lbs 13 oz 273 lbs  BMI 46.73 46.97 46.84  Systolic 129 -- --  Diastolic 91 -- --  Pulse 92      General:  No acute distress,  Alert and oriented, Non-Toxic, Normal speech and affect On exam, patient is sitting upright in no acute distress.  Sorbact is in place over the wound to the left lower extremity.  There does appear to be granulation tissue underneath the Sorbact as well as some unincorporated myriad.  It appears patella tendon is still exposed underneath the Sorbact.  There are no signs of infection on exam.  Assessment/Plan  Wound of left lower extremity, subsequent encounter   Discussed with patient that they should continue dressing changes as they have been.  Discussed with them that I will follow-up with home health care to  coordinate getting physical therapy set up for them.  They expressed understanding.  Dr. Ladona Ridgel also had the opportunity to examine the patient during today's visit.  It was discussed with the patient that she does not need to use the knee immobilizer anymore if it is inhibiting her mobility.  Discussed with her to avoid excessive bending of her knee.  Patient expressed understanding.  Patient to follow back up next week for removal of Sorbact.  Instructed them to call if they have any questions or concerns about anything.  Pictures were obtained of the patient and placed in the chart with the patient's or guardian's permission.   [name] 02/13/2023, 1:08 PM

## 2023-02-13 NOTE — Progress Notes (Unsigned)
Patient Active Problem List   Diagnosis Date Noted  . Wound of left lower extremity 02/06/2023  . S/P debridement 02/06/2023  . Dysuria 01/31/2023  . Encounter for palliative care involving management of pain 01/28/2023  . Acute post-traumatic wound infection 01/27/2023  . Nasal congestion 01/16/2023  . Deficiency anemia 01/15/2023  . Need for immunization against influenza 01/15/2023  . Diabetes mellitus (HCC) 12/19/2022  . Ventricular fibrillation (HCC) 12/10/2022  . Onychomycosis 11/15/2022  . Anxiety 10/27/2022  . Rosacea, acne 08/13/2022  . Primary osteoarthritis of both knees 03/06/2022  . Bilateral ovarian cysts 03/06/2022  . Major depressive disorder, recurrent episode, severe with anxious distress (HCC) 08/09/2021  . 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  . Need for varicella vaccine 08/09/2021  . Urge incontinence 08/01/2021  . Tinea corporis 03/14/2021  . Hyperlipidemia LDL goal <100 03/01/2021  . DDD (degenerative disc disease), lumbar 08/08/2020  . Non-seasonal allergic rhinitis due to pollen 07/25/2020  . Chronic idiopathic constipation 07/25/2020  . Visit for screening mammogram 02/11/2020  . Chronic bilateral low back pain without sciatica 02/10/2020  . Spondylosis without myelopathy or radiculopathy, lumbar region 12/04/2017  . Type 2 diabetes mellitus with hyperglycemia (HCC)   . Normal coronary arteries 04/17/2016  . OSA on CPAP 10/28/2014  . Chronic combined systolic and diastolic heart failure (HCC) 12/06/2013  . Hyperlipidemia associated with type 2 diabetes mellitus (HCC), on Zocor 04/04/2011  . Hypertension associated with diabetes (HCC) 06/03/2008  . Morbid obesity with BMI of 50.0-59.9, adult (HCC) 07/12/2006  . NICM (nonischemic cardiomyopathy) (HCC) 07/12/2006  . Migraine without status migrainosus, not intractable 07/12/2006    Patient's Medications  New Prescriptions   No medications on file  Previous  Medications   ACETAMINOPHEN (TYLENOL) 325 MG TABLET    Take 1-2 tablets (325-650 mg total) by mouth every 4 (four) hours as needed for mild pain (pain score 1-3).   ALPRAZOLAM (XANAX) 0.5 MG TABLET    Take 1 tablet (0.5 mg total) by mouth 2 (two) times daily as needed for anxiety (if anxiety unrelieved by hydroxyzine).   ASCORBIC ACID (VITAMIN C) 500 MG TABLET    Take 1 tablet (500 mg total) by mouth daily.   ATORVASTATIN (LIPITOR) 80 MG TABLET    Take 1 tablet (80 mg total) by mouth daily.   BLOOD GLUCOSE MONITORING SUPPL DEVI    1 each by Does not apply route in the morning, at noon, and at bedtime. May substitute to any manufacturer covered by patient's insurance.   CARVEDILOL (COREG) 25 MG TABLET    Take 25 mg by mouth 2 (two) times daily with a meal.   CEFDINIR (OMNICEF) 300 MG CAPSULE    Take 1 capsule (300 mg total) by mouth 2 (two) times daily for 7 days.   CIPROFLOXACIN (CIPRO) 500 MG TABLET    Take 1 tablet (500 mg total) by mouth 2 (two) times daily for 7 days.   COLLAGENASE (SANTYL) 250 UNIT/GM OINTMENT    Apply 1 Application topically daily.   DAPAGLIFLOZIN PROPANEDIOL (FARXIGA) 10 MG TABS TABLET    Take 1 tablet (10 mg total) by mouth daily.   DICLOFENAC SODIUM (VOLTAREN) 1 % GEL    Apply 2 g topically 4 (four) times daily. To jaw for TMJ symptoms   FLUTICASONE (FLONASE) 50 MCG/ACT NASAL SPRAY    Place 2 sprays into both nostrils daily.   FUROSEMIDE (LASIX) 40 MG TABLET    Take 1 tablet (40 mg total) by  mouth daily.   GLUCOSE BLOOD (BLOOD GLUCOSE TEST STRIPS) STRP    1 each by In Vitro route in the morning, at noon, and at bedtime. May substitute to any manufacturer covered by patient's insurance.   KETOCONAZOLE (NIZORAL) 2 % CREAM    Apply 1 Application topically daily. Apply 1gm to skin on plantar aspect of affected foot, and massage in well.  Apply once daily.   LIDOCAINE (LIDODERM) 5 %    Place 2 patches onto the skin daily. Remove & Discard patch within 12 hours or as directed by  MD   LOSARTAN (COZAAR) 25 MG TABLET    Take 0.5 tablets (12.5 mg total) by mouth daily.   MAGNESIUM OXIDE (MAG-OX) 400 MG TABLET    Take 1 tablet (400 mg total) by mouth at bedtime.   METFORMIN (GLUCOPHAGE-XR) 500 MG 24 HR TABLET    Take 1 tablet (500 mg total) by mouth 2 (two) times daily with a meal.   METHYLPHENIDATE (RITALIN) 5 MG TABLET    Take 1 tablet (5 mg total) by mouth 2 (two) times daily with breakfast and lunch.   METOPROLOL SUCCINATE (TOPROL-XL) 50 MG 24 HR TABLET    Take 1 tablet (50 mg total) by mouth daily. Take with or immediately following a meal.   MULTIPLE VITAMIN (MULTIVITAMIN WITH MINERALS) TABS TABLET    Take 1 tablet by mouth daily.   NON FORMULARY    Pt uses cpap nightly   NUTRITION SUPPLEMENT, JUVEN, (JUVEN) PACK    Take 1 packet by mouth 2 (two) times daily between meals.   OXYCODONE HCL 10 MG TABS    Take 1 tablet (10 mg total) by mouth 3 (three) times daily as needed.   POLYETHYLENE GLYCOL POWDER (GLYCOLAX/MIRALAX) 17 GM/SCOOP POWDER    Take 1 capful (17 g) in water 2 (two) times daily.   POTASSIUM CHLORIDE SA (KLOR-CON M) 20 MEQ TABLET    Take 1 tablet (20 mEq total) by mouth daily.   SENNA-DOCUSATE (SENOKOT-S) 8.6-50 MG TABLET    Take 2 tablets by mouth at bedtime.   SPIRONOLACTONE (ALDACTONE) 25 MG TABLET    Take 0.5 tablets (12.5 mg total) by mouth daily.   TOPIRAMATE (TOPAMAX) 25 MG TABLET    Take 1 tablet (25 mg total) by mouth at bedtime.   VORTIOXETINE HBR (TRINTELLIX) 5 MG TABS TABLET    Take 1 tablet (5 mg total) by mouth daily.  Modified Medications   No medications on file  Discontinued Medications   No medications on file    Subjective: Discussed the use of AI scribe software for clinical note transcription with the patient, who gave verbal consent to proceed.   57 Y O female with PMH of Anxiety/ADD/Depression, HTN, HLD, Type 2 DM, Morbid Obesity, CHF, post partum cardiomyopathy who is referred from Plastics due to infected left leg wound. She was  hospitalized for a prolonged duration 10/10-11/4 for prolonged VF cardiac arrest and required IV to be placed in left leg. This was complicated by skin necrosis and large wound. Seen by Orthopedics as well as plastics inpatient, recommended wound care and plan for possible surgical intervention in the future. She was discharged from rehab on 11/4. She had a wound cx drawn at PCP visit on 12/2 which grew PsA.  She has been closely following with Plastics and underwent debridement of [name] wound with placement of myriad tissue replacement matrix on 12/12. Or cx with E faecalis, Actinomyces spp, Actinomyces europaea.    12/19  Patient is accompanied by her husband who states that she is on her 2 nd round of ciprofloxacin. 1st round prescribed   She is on ciprofloxacin prescribed by our office on 01/30/2023.   Denies fevers, chills. Denies nausea, vomiting, abdominal pain and diarrhea. Denies cough, chest pain and sob.  She is 2nd course of 7 days ciprofloxacin and think she has 2-3 days of ciprofloxacin left.  She has some irritation in the left lateral breast due to life vest  Denies being on methyphenidate and vortioxetine  Review of Systems: ROS  Past Medical History:  Diagnosis Date  . ADD (attention deficit disorder)   . Anxiety   . Arthritis    both knees right worse than left  . Carpal tunnel syndrome of right wrist   . Depression   . Essential hypertension, benign   . Gallstones   . History of smoking 06/22/2016  . Hyperlipidemia associated with type 2 diabetes mellitus (HCC), on Zocor 04/04/2011  . Hypertension associated with diabetes (HCC) 06/03/2008  . Migraines   . Morbid obesity (HCC)   . Morbid obesity with BMI of 50.0-59.9, adult (HCC) 07/12/2006  . NICM (nonischemic cardiomyopathy) (HCC) 07/12/2006   01/16/18 ECHO:    - Procedure narrative: Transthoracic echocardiography. Image   quality was suboptimal. The study was technically difficult.   Intravenous contrast (Definity)  was administered. - Left ventricle: The cavity size was moderately dilated. Wall   thickness was increased in a pattern of mild LVH. Systolic   function was moderately to severely reduced. The estimated   ejection fraction w  . Normal coronary arteries 04/17/2016  . OSA on CPAP 10/28/2014  . Spinal stenosis    back pain  . Spondylosis without myelopathy or radiculopathy, lumbar region 12/04/2017  . Type 2 diabetes mellitus with hyperglycemia [facility])    Past Surgical History:  Procedure Laterality Date  . CHOLECYSTECTOMY    . DILATATION & CURETTAGE/HYSTEROSCOPY WITH MYOSURE N/A 10/31/2017   Procedure: DILATATION & CURETTAGE/HYSTEROSCOPY WITH MYOSURE;  Surgeon: Romualdo Bolk, MD;  Location: [facility] ORS;  Service: Gynecology;  Laterality: N/A;  . HYSTEROSCOPY WITH D & C N/A 05/07/2022   Procedure: DILATATION AND CURETTAGE /HYSTEROSCOPY;  Surgeon: [name], MD;  Location: [facility] OR;  Service: Gynecology;  Laterality: N/A;  . INCISION AND DRAINAGE OF WOUND Left 02/06/2023   Procedure: debridement of left leg wound, application of myriad;  Surgeon: [name], MD;  Location: [facility] OR;  Service: Plastics;  Laterality: Left;  . life vest    . RIGHT/LEFT HEART CATH AND CORONARY ANGIOGRAPHY N/A 04/11/2016   Procedure: Right/Left Heart Cath and Coronary Angiography;  Surgeon: Kathleene Hazel, MD;  Location: [facility] INVASIVE CV LAB;  Service: Cardiovascular;  Laterality: N/A;  . RIGHT/LEFT HEART CATH AND CORONARY ANGIOGRAPHY N/A 12/09/2022   Procedure: RIGHT/LEFT HEART CATH AND CORONARY ANGIOGRAPHY;  Surgeon: Yates Decamp, MD;  Location: [facility] INVASIVE CV LAB;  Service: Cardiovascular;  Laterality: N/A;  . sonogram for blood clots     no blockages     Social History   Tobacco Use  . Smoking status: Former    Current packs/day: 0.00    Types: Cigarettes    Quit date: 02/25/1997    Years since quitting: 25.9  . Smokeless tobacco: Never  . Tobacco comments:    Married, lives with spouse  (when he is not traveling) and son  Vaping Use  . Vaping status: Never Used  Substance Use Topics  . Alcohol use: No  .  Drug use: No    Family History  Problem Relation Age of Onset  . Depression Mother   . Anxiety disorder Mother   . Diabetes Mother   . Hypertension Mother   . ADD / ADHD Father   . Diabetes Father   . Hypertension Father   . Colitis Father   . Hypertension Other   . Diabetes Other   . Colitis Other   . Alcohol abuse Other     Allergies  Allergen Reactions  . Fluoxetine Other (See Comments)    More depressed  . Cymbalta [Duloxetine Hcl] Other (See Comments)    depressed  . Pork-Derived Products   . Penicillins Rash    Has patient had a PCN reaction causing immediate rash, facial/tongue/throat swelling, SOB or lightheadedness with hypotension: YES Has patient had a PCN reaction causing severe rash involving mucus membranes or skin necrosis: NO Has patient had a PCN reaction that required hospitalization: YES Has patient had a PCN reaction occurring within the last 10 years: NO If all of the above answers are "NO", then may proceed with Cephalosporin use.     Health Maintenance  Topic Date Due  . COVID-19 Vaccine (4 - 2024-25 season) 10/27/2022  . Zoster Vaccines- Shingrix (1 of 2) 04/18/2023 (Originally 05/28/1984)  . FOOT EXAM  03/07/2023  . HEMOGLOBIN A1C  06/07/2023  . MAMMOGRAM  07/09/2023  . OPHTHALMOLOGY EXAM  08/09/2023  . Colonoscopy  02/11/2026  . Cervical Cancer Screening (HPV/Pap Cotest)  08/02/2026  . DTaP/Tdap/Td (6 - Td or Tdap) 11/08/2027  . INFLUENZA VACCINE  Completed  . Hepatitis C Screening  Completed  . HIV Screening  Completed  . HPV VACCINES  Aged Out    Objective:  There were no vitals filed for this visit. There is no height or weight on file to calculate BMI.  Physical Exam Constitutional:      Appearance: Normal appearance.  HENT:     Head: Normocephalic and atraumatic.      Mouth: Mucous membranes are moist.   Eyes:    Conjunctiva/sclera: Conjunctivae normal.     Pupils: Pupils are equal, round, and reactive to light.   Cardiovascular:     Rate and Rhythm: Normal rate and regular rhythm.     Heart sounds: No murmur heard. No friction rub. No gallop.   Pulmonary:     Effort: Pulmonary effort is normal.     Breath sounds: Normal breath sounds.   Abdominal:     General: Non distended     Palpations: soft.   Musculoskeletal:        General: Normal range of motion.   Skin:    General: Skin is warm and dry.     Comments:  Neurological:     General: grossly non focal     Mental Status: awake, alert and oriented to person, place, and time.   Psychiatric:        Mood and Affect: Mood normal.   Lab Results Lab Results  Component Value Date   WBC 12.4 (H) 01/15/2023   HGB 13.8 01/15/2023   HCT 42.2 01/15/2023   MCV 87.0 01/15/2023   PLT 423.0 (H) 01/15/2023    Lab Results  Component Value Date   CREATININE 1.04 (H) 01/14/2023   BUN 23 (H) 01/14/2023   NA 138 01/14/2023   K 4.2 01/14/2023   CL 102 01/14/2023   CO2 24 01/14/2023    Lab Results  Component Value Date   ALT 26 12/18/2022  AST 23 12/18/2022   ALKPHOS 42 12/18/2022   BILITOT 0.9 12/18/2022    Lab Results  Component Value Date   CHOL 194 08/13/2022   HDL 48.90 08/13/2022   LDLCALC 117 (H) 08/13/2022   LDLDIRECT 143.8 04/03/2011   TRIG 208 (H) 12/06/2022   CHOLHDL 4 08/13/2022   No results found for: "LABRPR", "RPRTITER" No results found for: "HIV1RNAQUANT", "HIV1RNAVL", "CD4TABS"      I have personally spent at least 60 minutes involved in face-to-face and non-face-to-face activities for this patient on the day of the visit. Professional time spent includes the following activities: Preparing to see the patient (review of tests), Obtaining and/or reviewing separately obtained history (admission/discharge record), Performing a medically appropriate examination and/or evaluation , Ordering  medications/tests/procedures, referring and communicating with other health care professionals, Documenting clinical information in the EMR, Independently interpreting results (not separately reported), Communicating results to the patient/family/caregiver, Counseling and educating the patient/family/caregiver and Care coordination (not separately reported).   Of note, portions of this note may have been created with voice recognition software. While this note has been edited for accuracy, occasional wrong-word or 'sound-a-like' substitutions may have occurred due to the inherent limitations of voice recognition software.   Victoriano Lain, MD [facility] 02/13/2023, 12:47 PM

## 2023-02-14 ENCOUNTER — Telehealth: Payer: Self-pay

## 2023-02-14 ENCOUNTER — Other Ambulatory Visit: Payer: Medicare Other

## 2023-02-14 ENCOUNTER — Other Ambulatory Visit: Payer: Self-pay | Admitting: Student

## 2023-02-14 DIAGNOSIS — T8089XA Other complications following infusion, transfusion and therapeutic injection, initial encounter: Secondary | ICD-10-CM | POA: Diagnosis not present

## 2023-02-14 DIAGNOSIS — I5032 Chronic diastolic (congestive) heart failure: Secondary | ICD-10-CM | POA: Diagnosis not present

## 2023-02-14 DIAGNOSIS — I96 Gangrene, not elsewhere classified: Secondary | ICD-10-CM | POA: Diagnosis not present

## 2023-02-14 DIAGNOSIS — I152 Hypertension secondary to endocrine disorders: Secondary | ICD-10-CM | POA: Diagnosis not present

## 2023-02-14 DIAGNOSIS — T8189XA Other complications of procedures, not elsewhere classified, initial encounter: Secondary | ICD-10-CM | POA: Diagnosis not present

## 2023-02-14 DIAGNOSIS — I5023 Acute on chronic systolic (congestive) heart failure: Secondary | ICD-10-CM | POA: Diagnosis not present

## 2023-02-14 LAB — CBC
HCT: 45.2 % — ABNORMAL HIGH (ref 35.0–45.0)
Hemoglobin: 14.2 g/dL (ref 11.7–15.5)
MCH: 27.3 pg (ref 27.0–33.0)
MCHC: 31.4 g/dL — ABNORMAL LOW (ref 32.0–36.0)
MCV: 86.9 fL (ref 80.0–100.0)
MPV: 10.1 fL (ref 7.5–12.5)
Platelets: 373 10*3/uL (ref 140–400)
RBC: 5.2 10*6/uL — ABNORMAL HIGH (ref 3.80–5.10)
RDW: 13.5 % (ref 11.0–15.0)
WBC: 13.3 10*3/uL — ABNORMAL HIGH (ref 3.8–10.8)

## 2023-02-14 LAB — BASIC METABOLIC PANEL
BUN: 15 mg/dL (ref 7–25)
CO2: 28 mmol/L (ref 20–32)
Calcium: 9.7 mg/dL (ref 8.6–10.4)
Chloride: 102 mmol/L (ref 98–110)
Creat: 0.88 mg/dL (ref 0.50–1.03)
Glucose, Bld: 99 mg/dL (ref 65–99)
Potassium: 3.8 mmol/L (ref 3.5–5.3)
Sodium: 142 mmol/L (ref 135–146)

## 2023-02-14 LAB — C-REACTIVE PROTEIN: CRP: 28.6 mg/L — ABNORMAL HIGH (ref <8.0)

## 2023-02-14 LAB — SEDIMENTATION RATE: Sed Rate: 50 mm/h — ABNORMAL HIGH (ref 0–30)

## 2023-02-14 NOTE — Telephone Encounter (Signed)
Spoke to [facility]) and gave verbal orders for PT/OT 2xweek/6 weeks.

## 2023-02-15 ENCOUNTER — Encounter: Payer: Self-pay | Admitting: Infectious Diseases

## 2023-02-17 ENCOUNTER — Telehealth: Payer: Self-pay | Admitting: Cardiology

## 2023-02-17 DIAGNOSIS — I5032 Chronic diastolic (congestive) heart failure: Secondary | ICD-10-CM | POA: Diagnosis not present

## 2023-02-17 DIAGNOSIS — T8089XA Other complications following infusion, transfusion and therapeutic injection, initial encounter: Secondary | ICD-10-CM | POA: Diagnosis not present

## 2023-02-17 DIAGNOSIS — I96 Gangrene, not elsewhere classified: Secondary | ICD-10-CM | POA: Diagnosis not present

## 2023-02-17 DIAGNOSIS — I152 Hypertension secondary to endocrine disorders: Secondary | ICD-10-CM | POA: Diagnosis not present

## 2023-02-17 DIAGNOSIS — T8189XA Other complications of procedures, not elsewhere classified, initial encounter: Secondary | ICD-10-CM | POA: Diagnosis not present

## 2023-02-17 DIAGNOSIS — I5023 Acute on chronic systolic (congestive) heart failure: Secondary | ICD-10-CM | POA: Diagnosis not present

## 2023-02-17 NOTE — Telephone Encounter (Signed)
Returned call, spoke with pt's husband. He states he placed the initial call. He has questions about Spironolactone, Losartan. His states she has not  120/80, 129 sbp, 110 sbp. Pt has a would care nurse, that check her blood pressure. 80-85 dbp. Pt's husband is frustrated because at the last hospital discharged they were instructed "no medication changes". We went over the list he has and the list in the computer. They do not match. He states he was told not to give her Coreg but it's on the medication list. List in the computer states Coreg 25 mg twice daily, pt has not been taking this medication. List in computer states Spirolactone 12.5 mg once daily as well as Losartan 12.5 mg daily. He states pt has ran out of the medications and now he is not sure which medications she should be taking. List he has does not include Coreg.

## 2023-02-17 NOTE — Telephone Encounter (Signed)
Spoke with Dr. [name]. Went over pt's medication list. Coreg should not be on pt's medication list. Medication has been removed. Called pt's husband. Went over medication list again. He was thankful for the call. No further questions at this time.

## 2023-02-17 NOTE — Telephone Encounter (Signed)
New Message:      Patient says he needs to talk to Dr Hochrein's nurse, He said he is confused about two of her medicine. He would not state the names of the medicine.

## 2023-02-20 ENCOUNTER — Telehealth: Payer: Self-pay | Admitting: Internal Medicine

## 2023-02-20 ENCOUNTER — Ambulatory Visit: Payer: Medicare Other | Admitting: Physician Assistant

## 2023-02-20 ENCOUNTER — Telehealth: Payer: Self-pay

## 2023-02-20 VITALS — BP 115/83 | HR 91

## 2023-02-20 DIAGNOSIS — S81802D Unspecified open wound, left lower leg, subsequent encounter: Secondary | ICD-10-CM

## 2023-02-20 NOTE — H&P (View-Only) (Signed)
 Patient is a pleasant 57 year old female s/p debridement of chronic left lower extremity wound with placement of myriad who presents to clinic for postoperative follow-up.  She was last seen here in clinic on 02/13/2023.  At that time, she reported that the immobilizer was bothersome and inhibiting her mobility.  HHN and has been performing dressing changes.  Sorbact was in place.  Suspect likely exposure of patellar tendon but need to Sorbact.  Dr. Ladona Ridgel personally evaluated patient and discontinued use of knee immobilizer.  Follow-up in 1 week for removal of Sorbact.  Today, patient is accompanied by husband at bedside.  She states that she has had to compensate from her left leg injury which is now rendering right knee pain as well as low back pain.  She is having a tough time, understandably.  On exam, the interrupted and running Prolene sutures were removed allowing for the Sorbact to then be gently lifted.  Only minimal bleeding was provoked and hemostasis achieved with saline soaked gauze compression.  The patellar tendon remains exposed, but the remainder of the wound bed appears to be healing nicely.  There is some residual unincorporated myriad sheets, but the area appears clean.  No evidence concerning for infection.  Recommending dressing changes as follows: Remove the dressing. Apply Vashe soaked gauze x 5 minutes to wound. Apply Adaptic followed by a liberal amount of K-Y jelly. Apply ABD pad followed by Kerlix and Ace wrap. Change twice daily.  Patient understands that there may be another trip to the OR for myriad placement to assist with granulation over exposed tendon.  Discussed case with Dr. Ladona Ridgel who confirms.  Continued dressing changes in interim.  Return in 10 days, sooner if needed.  Picture(s) obtained of the patient and placed in the chart were with the patient's or guardian's permission.

## 2023-02-20 NOTE — Addendum Note (Signed)
Addended by: Weyman Croon on: 02/20/2023 01:35 PM   Modules accepted: Orders

## 2023-02-20 NOTE — Telephone Encounter (Signed)
Can I give the verbal orders ?

## 2023-02-20 NOTE — Progress Notes (Signed)
Patient is a pleasant 57 year old female s/p debridement of chronic left lower extremity wound with placement of myriad who presents to clinic for postoperative follow-up.  She was last seen here in clinic on 02/13/2023.  At that time, she reported that the immobilizer was bothersome and inhibiting her mobility.  HHN and has been performing dressing changes.  Sorbact was in place.  Suspect likely exposure of patellar tendon but need to Sorbact.  Dr. Ladona Ridgel personally evaluated patient and discontinued use of knee immobilizer.  Follow-up in 1 week for removal of Sorbact.  Today, patient is accompanied by husband at bedside.  She states that she has had to compensate from her left leg injury which is now rendering right knee pain as well as low back pain.  She is having a tough time, understandably.  On exam, the interrupted and running Prolene sutures were removed allowing for the Sorbact to then be gently lifted.  Only minimal bleeding was provoked and hemostasis achieved with saline soaked gauze compression.  The patellar tendon remains exposed, but the remainder of the wound bed appears to be healing nicely.  There is some residual unincorporated myriad sheets, but the area appears clean.  No evidence concerning for infection.  Recommending dressing changes as follows: Remove the dressing. Apply Vashe soaked gauze x 5 minutes to wound. Apply Adaptic followed by a liberal amount of K-Y jelly. Apply ABD pad followed by Kerlix and Ace wrap. Change twice daily.  Patient understands that there may be another trip to the OR for myriad placement to assist with granulation over exposed tendon.  Discussed case with Dr. Ladona Ridgel who confirms.  Continued dressing changes in interim.  Return in 10 days, sooner if needed.  Picture(s) obtained of the patient and placed in the chart were with the patient's or guardian's permission.

## 2023-02-20 NOTE — Telephone Encounter (Signed)
Verbal orders given  

## 2023-02-20 NOTE — Telephone Encounter (Signed)
Fax sent to [facility] regarding dressing changes for left lower extremity. Confirmed receipt.

## 2023-02-20 NOTE — Telephone Encounter (Signed)
Copied from CRM [id number]. Topic: Clinical - Home Health Verbal Orders >> Feb 20, 2023 11:32 AM Almira Coaster wrote: Caller/Agency:  [facility] Callback Number: [phone] Service Requested: Physical Therapy Frequency: once a week for two weeks with plans to recertify the patient. It it's possible to get order done today, they'll be able to see patient tomorrow.  Any new concerns about the patient? No

## 2023-02-21 ENCOUNTER — Telehealth: Payer: Self-pay | Admitting: *Deleted

## 2023-02-21 ENCOUNTER — Encounter (HOSPITAL_COMMUNITY): Payer: Self-pay | Admitting: Plastic Surgery

## 2023-02-21 ENCOUNTER — Other Ambulatory Visit: Payer: Self-pay

## 2023-02-21 NOTE — Telephone Encounter (Signed)
LVM with surgery date and time including arrival time of 1045 to [facility] Entrance A

## 2023-02-21 NOTE — Progress Notes (Signed)
Anesthesia Chart Review: [name]  Case: [id number] Date/Time: 02/24/23 1614   Procedures:      application of tissue replacement matrix,left patellar tendon, possible wound debridement (Left: Knee)     APPLICATION OF SKIN SUBSTITUTE (Left: Knee)   Anesthesia type: Choice   Pre-op diagnosis: knee wound   [facility]: [facility] ROOM 02 / [facility]   Surgeons: [name], MD       DISCUSSION:  Patient is a 57 year old female scheduled for the above procedure. She is s/p debridement of [name] wound with placement of Myriad tissue replacement matrix on 02/06/23. At 02/20/23 plastic surgery follow-up, patellar tendon remained exposed, but the remainder of the wound bed appeared to be healing nicely. Above procedure recommended to assist with granulation over exposed tendon.     History includes former smoker (quit 02/25/97), HTN, nonischemic cardiomyopathy (diagnosed post-partum 2003), cardiac arrest (12/05/22, see below; EF 30-35%, normal coronaries, 12/09/22), DM2, OSA (intolerant of CPAP), HLD, fibromyalgia, morbid obesity.    [facility] admission 12/05/22 - 12/17/22 for out of hospital witnessed cardiac arrest. Husband called EMS and started CPR. Found to be in VT/VF and ACLS initiated. ROSC after 40 minutes, total of shocks x6, epinephrine x9, 300 mg amiodarone, 2 gm of magnesium. Intubated on arrival to ED. IV access obtained with use of [name] IO. 12/07/22 echo showed unchanged LVEF 30-35%, diffuse LV hypokinesis, mild LVH, normal RV systolic function, trivial MR. RHC/LHC on 12/09/22 showed normal coronaries, mildly elevated PA pressure with elevated EDP, non-ischemic cardiomyopathy. Cardiac MRI on 12/12/22 showed EF 31%, RV insertion site late gadolinium enhancement with non-specific scar pattern that can be seen in setting of elevated pulmonary pressures. She developed [name] wound at site of IO placement. Orthopedics and plastic surgery consulted. As above, local wound care recommended unless  worsening or infection. She was discharged to [facility] (12/17/22 - 12/30/22) for rehab therapy on GDMT and a LifeVest.    Plastic surgeon Dr. Ladona Ridgel has been following her [name] wound. While hospitalized wound did not appear infected, so given recent cardiac arrest, recommendation to keep eschar in place and allow wound to begin to granulate and reepithelialize. Later leg wound grew Pseudomonas aeruginosa on 01/27/23 culture. Than at 02/04/23 follow-up [name] wound debrided in office and noted exposed patellar tendon. After communicating with cardiology, Dr. Ladona Ridgel took her to the OR on 02/06/23 for debridement of [name] wound with placement of Myriad tissue replacement matrix.    She had EP evaluation with [name],[name], PA-C on 01/15/23 for follow-up non-ischemic CM with HFrEF and witnessed cardiac arrest 12/05/22. CPR/ACLS x 40 minutes with shocks x6 and ROSC. ICD not placed due to extensive [name] wound. "The risks of a transvenous device outweigh the benefits at this juncture, with significant risk or infection given ongoing leg wound." Continued Life Vest planned for now. Follow-up with Dr. [name] in 3 months.   Evaluated at the [facility] by Tonye Becket, NP on 01/14/23. Volume status stable. Unable to titrate GDMT due to some low BPs and occasional lightheadedness. Contiue with Life vest. Repeat echo in 3-4 months.    Last cardiology visit with primary cardiologist Dr. [name] was on 01/22/23. Evaluate in one months to see if able to further titrate medication. Eventually will need ICD for secondary prevention, but continue Life vest for now until [name] wound improved.   Anesthesia team to evaluate on the day of surgery. Last Fargixa 02/20/23.    VS: Ht 5\' 4"  (1.626 m)   Wt 122.4  kg   LMP  (LMP Unknown)   BMI 46.31 kg/m  BP Readings from Last 3 Encounters:  02/20/23 115/83  02/13/23 130/80  02/13/23 (!) 129/91   Pulse Readings from Last 3 Encounters:  02/20/23 91  02/13/23 94  02/13/23 92     PROVIDERS: [name], MD is PCP  [name], MD is primary cardiologist Tonye Becket, NP is HF Provider [name], Will, MD is EP cardiologist   LABS: Last labs in [facility] include: Lab Results  Component Value Date   WBC 13.3 (H) 02/13/2023   HGB 14.2 02/13/2023   HCT 45.2 (H) 02/13/2023   PLT 373 02/13/2023   GLUCOSE 99 02/13/2023   ALT 26 12/18/2022   AST 23 12/18/2022   NA 142 02/13/2023   K 3.8 02/13/2023   CL 102 02/13/2023   CREATININE 0.88 02/13/2023   BUN 15 02/13/2023   CO2 28 02/13/2023   TSH 4.29 01/15/2023   INR 1.2 02/25/2022   HGBA1C 6.6 (H) 12/07/2022   MICROALBUR 1.3 03/06/2022    IMAGES: Xray C-spine 12/21/22: IMPRESSION: 1. Multilevel cervical spondylosis and facet hypertrophy. 2. No acute bony abnormalities.   1V CXR 12/10/22: FINDINGS: Patient is rotated. Endotracheal and enteric tubes have been removed. Left subclavian central line remains in place. Cardiac silhouette remains mildly enlarged. Improving aeration of the lung bases with mild residual atelectasis. No pleural effusion or pneumothorax. IMPRESSION: Improving aeration of the lung bases with mild residual atelectasis.   CTA Chest 12/05/22: IMPRESSION: 1. No evidence of pulmonary embolism. 2. Bilateral upper lobe atelectasis. Pneumonia is considered less likely though difficult to exclude.       EKG: 01/14/23: Normal sinus rhythm Low voltage QRS Confirmed by Steffanie Dunn [phone]) on 01/14/2023 9:24:06 PM     CV: Cardiac MRI 12/12/22: IMPRESSION: 1. Normal LV size, no hypertrophy, and moderate systolic dysfunction (EF 31%) 2.  Normal RV size and systolic function (EF 50%) 3. RV insertion site late gadolinium enhancement, which is a nonspecific scar pattern often seen in setting of elevated pulmonary pressures     Right & Left Heart Catheterization 12/09/22: Hemodynamic data: LV: 119/7, EDP 17 mmHg.  Ao 110/78, mean 92 mmHg.  No pressure gradient across the  aortic valve. RA: 15/13, mean 8 mmHg. RV 47/6, EDP 17 mmHg. PA 46/21, mean 31 mmHg.  PA saturation 66%. PW 18/19, mean 19 mmHg.  AO saturation 92%. CO 6.69, CI 2.83, preserved and normal.  QP/QS 1.00.   Angiographic data: RCA: Dominant, has anterior origin, small to normal. LM: Has superior takeoff, a 5 [place] 3.0 guide catheter was utilized to engage.  Normal left main. LAD: Large-caliber vessel giving origin to 2 large diagonals, smooth and normal. RI: Moderate caliber vessel, smooth and normal. LCx: Large vessel giving origin to large OM1 which has secondary branches and continues in the AV groove is a moderate-sized vessel and gives origin to OM 2 from the distal end.  Smooth and normal.   Impression and recommendations: Findings consistent with nonischemic cardiomyopathy.  Mildly elevated PA pressure with elevated EDP.       Echo 12/07/22: IMPRESSIONS   1. Left ventricular ejection fraction, by estimation, is 30 to 35%. The  left ventricle has moderately decreased function. The left ventricle  demonstrates global hypokinesis. There is mild left ventricular  hypertrophy. Indeterminate diastolic filling due   to E-A fusion.   2. Right ventricular systolic function is normal. The right ventricular  size is normal.   3. The  mitral valve is normal in structure. Trivial mitral valve  regurgitation.   4. The aortic valve is tricuspid. Aortic valve regurgitation is not  visualized.   5. The inferior vena cava IVC not well visualized.  - Comparison(s): No significant change from prior study.  - Comparison LVEF: 30-35% 07/10/21; 50-55% 12/30/18; 30-35% 01/16/18; 35-40% 08/22/17; 30-35% 10/26/15, 06/08/13; 50% 11/06/11; 45-50% 06/27/11; 60% 01/13/07   Past Medical History:  Diagnosis Date   ADD (attention deficit disorder)    Anxiety    Arthritis    both knees right worse than left   Carpal tunnel syndrome of right wrist    Depression    Essential hypertension, benign    Gallstones     History of smoking 06/22/2016   Hyperlipidemia associated with type 2 diabetes mellitus (HCC), on Zocor 04/04/2011   Hypertension associated with diabetes (HCC) 06/03/2008   Migraines    Morbid obesity (HCC)    Morbid obesity with BMI of 50.0-59.9, adult (HCC) 07/12/2006   NICM (nonischemic cardiomyopathy) (HCC) 07/12/2006   01/16/18 ECHO:    - Procedure narrative: Transthoracic echocardiography. Image   quality was suboptimal. The study was technically difficult.   Intravenous contrast (Definity) was administered. - Left ventricle: The cavity size was moderately dilated. Wall   thickness was increased in a pattern of mild LVH. Systolic   function was moderately to severely reduced. The estimated   ejection fraction w   Normal coronary arteries 04/17/2016   OSA on CPAP 10/28/2014   Spinal stenosis    back pain   Spondylosis without myelopathy or radiculopathy, lumbar region 12/04/2017   Type 2 diabetes mellitus with hyperglycemia [facility])     Past Surgical History:  Procedure Laterality Date   CHOLECYSTECTOMY     DILATATION & CURETTAGE/HYSTEROSCOPY WITH MYOSURE N/A 10/31/2017   Procedure: DILATATION & CURETTAGE/HYSTEROSCOPY WITH MYOSURE;  Surgeon: Romualdo Bolk, MD;  Location: [facility]S;  Service: Gynecology;  Laterality: N/A;   HYSTEROSCOPY WITH D & C N/A 05/07/2022   Procedure: DILATATION AND CURETTAGE /HYSTEROSCOPY;  Surgeon: [name], MD;  Location: [facility] OR;  Service: Gynecology;  Laterality: N/A;   INCISION AND DRAINAGE OF WOUND Left 02/06/2023   Procedure: debridement of left leg wound, application of myriad;  Surgeon: [name], MD;  Location: [facility] OR;  Service: Plastics;  Laterality: Left;   life vest     RIGHT/LEFT HEART CATH AND CORONARY ANGIOGRAPHY N/A 04/11/2016   Procedure: Right/Left Heart Cath and Coronary Angiography;  Surgeon: Kathleene Hazel, MD;  Location: [facility] INVASIVE CV LAB;  Service: Cardiovascular;  Laterality: N/A;   RIGHT/LEFT HEART CATH  AND CORONARY ANGIOGRAPHY N/A 12/09/2022   Procedure: RIGHT/LEFT HEART CATH AND CORONARY ANGIOGRAPHY;  Surgeon: Yates Decamp, MD;  Location: [facility] INVASIVE CV LAB;  Service: Cardiovascular;  Laterality: N/A;   sonogram for blood clots     no blockages    MEDICATIONS: No current facility-administered medications for this encounter.    acetaminophen (TYLENOL) 325 MG tablet   ALPRAZolam (XANAX) 0.5 MG tablet   ascorbic acid (VITAMIN C) 500 MG tablet   atorvastatin (LIPITOR) 80 MG tablet   Blood Glucose Monitoring Suppl DEVI   cefdinir (OMNICEF) 300 MG capsule   collagenase (SANTYL) 250 UNIT/GM ointment   dapagliflozin propanediol (FARXIGA) 10 MG TABS tablet   diclofenac Sodium (VOLTAREN) 1 % GEL   fluconazole (DIFLUCAN) 150 MG tablet   fluticasone (FLONASE) 50 MCG/ACT nasal spray   furosemide (LASIX) 40 MG tablet   Glucose Blood (BLOOD GLUCOSE  TEST STRIPS) STRP   ketoconazole (NIZORAL) 2 % cream   lidocaine (LIDODERM) 5 %   linezolid (ZYVOX) 600 MG tablet   losartan (COZAAR) 25 MG tablet   magnesium oxide (MAG-OX) 400 MG tablet   metFORMIN (GLUCOPHAGE-XR) 500 MG 24 hr tablet   metoprolol succinate (TOPROL-XL) 50 MG 24 hr tablet   Multiple Vitamin (MULTIVITAMIN WITH MINERALS) TABS tablet   NON FORMULARY   nutrition supplement, JUVEN, (JUVEN) PACK   Oxycodone HCl 10 MG TABS   polyethylene glycol powder (GLYCOLAX/MIRALAX) 17 GM/SCOOP powder   potassium chloride SA (KLOR-CON M) 20 MEQ tablet   senna-docusate (SENOKOT-S) 8.6-50 MG tablet   spironolactone (ALDACTONE) 25 MG tablet   topiramate (TOPAMAX) 25 MG tablet   She is not currently on Omnicef.   Shonna Chock, PA-C Surgical Short Stay/Anesthesiology [facility] Phone [phone] [facility] Phone [phone] 02/21/2023 10:42 AM

## 2023-02-21 NOTE — Anesthesia Preprocedure Evaluation (Addendum)
Anesthesia Evaluation  Patient identified by MRN, date of birth, ID band Patient awake    Reviewed: Allergy & Precautions, NPO status , Patient's Chart, lab work & pertinent test results, reviewed documented beta blocker date and time   History of Anesthesia Complications Negative for: history of anesthetic complications  Airway Mallampati: III  TM Distance: >3 FB Neck ROM: Full    Dental  (+) Dental Advisory Given Denies loose teeth.:   Pulmonary neg shortness of breath, sleep apnea and Continuous Positive Airway Pressure Ventilation , neg COPD, neg recent URI, former smoker   Pulmonary exam normal breath sounds clear to auscultation       Cardiovascular hypertension, Pt. on medications and Pt. on home beta blockers (-) angina +CHF  (-) Past MI, (-) Cardiac Stents and (-) CABG  Rhythm:Regular Rate:Normal  [facility] admission 12/05/22 - 12/17/22 for out of hospital witnessed cardiac arrest. Husband called EMS and started CPR. Found to be in VT/VF and ACLS initiated. ROSC after 40 minutes, total of shocks x6, epinephrine x9, 300 mg amiodarone, 2 gm of magnesium. Intubated on arrival to ED. IV access obtained with use of [name] IO. 12/07/22 echo showed unchanged LVEF 30-35%, diffuse LV hypokinesis, mild LVH, normal RV systolic function, trivial MR. RHC/LHC on 12/09/22 showed normal coronaries, mildly elevated PA pressure with elevated EDP, non-ischemic cardiomyopathy. Cardiac MRI on 12/12/22 showed EF 31%, RV insertion site late gadolinium enhancement with non-specific scar pattern that can be seen in setting of elevated pulmonary pressures. She developed [name] wound at site of IO placement.   Using LifeVest since she is not a candidate for ICD until [name] wound healed.      RHC/LHC 12/09/22: Impression and recommendations: Findings consistent with nonischemic cardiomyopathy.  Mildly elevated PA pressure with elevated EDP.     Echo  12/07/22: IMPRESSIONS   1. Left ventricular ejection fraction, by estimation, is 30 to 35%. The  left ventricle has moderately decreased function. The left ventricle  demonstrates global hypokinesis. There is mild left ventricular  hypertrophy. Indeterminate diastolic filling due   to E-A fusion.   2. Right ventricular systolic function is normal. The right ventricular  size is normal.   3. The mitral valve is normal in structure. Trivial mitral valve  regurgitation.   4. The aortic valve is tricuspid. Aortic valve regurgitation is not  visualized.   5. The inferior vena cava IVC not well visualized.  - Comparison(s): No significant change from prior study.       Neuro/Psych  Headaches, neg Seizures PSYCHIATRIC DISORDERS (ADD) Anxiety Depression       GI/Hepatic negative GI ROS, Neg liver ROS,,,  Endo/Other  diabetes, Well Controlled, Type 2, Oral Hypoglycemic Agents  Class 3 obesityBMI 47  Renal/GU negative Renal ROS  negative genitourinary   Musculoskeletal  (+) Arthritis , Osteoarthritis,    Abdominal  (+) + obese  Peds  Hematology negative hematology ROS (+)   Anesthesia Other Findings [name] wound  Reproductive/Obstetrics negative OB ROS                             Anesthesia Physical Anesthesia Plan  ASA: 4  Anesthesia Plan: General   Post-op Pain Management: Tylenol PO (pre-op)*   Induction: Intravenous  PONV Risk Score and Plan: Ondansetron, Dexamethasone and Treatment may vary due to age or medical condition  Airway Management Planned: LMA  Additional Equipment:   Intra-op Plan:   Post-operative Plan: Extubation  in OR  Informed Consent: I have reviewed the patients History and Physical, chart, labs and discussed the procedure including the risks, benefits and alternatives for the proposed anesthesia with the patient or authorized representative who has indicated his/her understanding and acceptance.     Dental advisory  given  Plan Discussed with: CRNA and Anesthesiologist  Anesthesia Plan Comments: (See PAT note written 02/21/2023 by Shonna Chock, PA-C.  Risks of general anesthesia discussed including, but not limited to, sore throat, hoarse voice, chipped/damaged teeth, injury to vocal cords, nausea and vomiting, allergic reactions, lung infection, heart attack, stroke, and death. All questions answered.  )       Anesthesia Quick Evaluation

## 2023-02-21 NOTE — Progress Notes (Signed)
Husband and pt made aware of surgery time change for Mon 02/24/23, 1300-1400, arrival 1030, stop eating solid food at midnight and stop drinking clear liquids by 0800.

## 2023-02-21 NOTE — Progress Notes (Addendum)
   SDW call   Patient's husband [name] was given pre-op instructions over the phone. He verbalized understanding of instructions provided.   Patient wears a Life vest after cardiac arrest 12/05/2022.    PCP - Dr. Sanda Linger Cardiologist - Dr. [name]    PPM/ICD - denies Device Orders - na Rep Notified - na   Chest x-ray - 12/10/2022 EKG -  01/14/2023 Stress Test -09/25/2017 ECHO - 12/07/2022 Cardiac Cath - 12/09/2022   Sleep Study/sleep apnea/CPAP: OSA, cannot tolerate wearing a CPAP   Type II diabetic Fasting Blood sugar range: less than 130 How often check sugars: three times a day.  Marcelline Deist, last dose 02/20/2023 Metformin, instructed to hold morning of surgery   Blood Thinner Instructions: denies Aspirin Instructions:denies   ERAS Protcol - Clears until 1330   COVID TEST- na    Anesthesia review: Yes.  HTN, DM, recent cardiac arrest, wears life vest, OSA,    Patient denies shortness of breath, fever, cough and chest pain over the phone call   Your procedure is scheduled on Monday December 30th.             Report to [facility] Main Entrance "A" at 2:00 PM, then check in with the Admitting office.             Call this number if you have problems the morning of surgery:             [phone]    If you have any questions prior to your surgery date call [phone]: Open Monday-Friday 8am-4pm If you experience any cold or flu symptoms such as cough, fever, chills, shortness of breath, etc. between now and your scheduled surgery, please notify us at the above number                Remember:             Do not eat after midnight the night before your surgery   You may drink clear liquids until 1330 the day of your surgery.   Clear liquids allowed are: Water, Non-Citrus Juices (without pulp), Carbonated Beverages, Clear Tea, Black Coffee ONLY (NO MILK, CREAM OR POWDERED CREAMER of any kind), and Gatorade              Take these medicines the morning of  surgery with A SIP OF WATER:  Atorvastatin, metoprolol, zyvox   As needed: Tylenol, xanax, flonase, oxycodone.    As of today, STOP taking any Aspirin (unless otherwise instructed by your surgeon) Aleve, Naproxen, Ibuprofen, Motrin, Advil, Goody's, BC's, all herbal medications, fish oil, and all vitamins.

## 2023-02-22 ENCOUNTER — Other Ambulatory Visit: Payer: Self-pay | Admitting: Physical Medicine and Rehabilitation

## 2023-02-22 DIAGNOSIS — I5023 Acute on chronic systolic (congestive) heart failure: Secondary | ICD-10-CM | POA: Diagnosis not present

## 2023-02-22 DIAGNOSIS — I5032 Chronic diastolic (congestive) heart failure: Secondary | ICD-10-CM | POA: Diagnosis not present

## 2023-02-22 DIAGNOSIS — I96 Gangrene, not elsewhere classified: Secondary | ICD-10-CM | POA: Diagnosis not present

## 2023-02-22 DIAGNOSIS — T8089XA Other complications following infusion, transfusion and therapeutic injection, initial encounter: Secondary | ICD-10-CM | POA: Diagnosis not present

## 2023-02-22 DIAGNOSIS — I152 Hypertension secondary to endocrine disorders: Secondary | ICD-10-CM | POA: Diagnosis not present

## 2023-02-22 DIAGNOSIS — T8189XA Other complications of procedures, not elsewhere classified, initial encounter: Secondary | ICD-10-CM | POA: Diagnosis not present

## 2023-02-24 ENCOUNTER — Encounter (HOSPITAL_COMMUNITY)
Admission: RE | Disposition: A | Discharge status: Home / Self Care | Payer: Self-pay | Source: Home / Self Care | Attending: Plastic Surgery

## 2023-02-24 ENCOUNTER — Telehealth (HOSPITAL_COMMUNITY): Payer: Self-pay

## 2023-02-24 ENCOUNTER — Other Ambulatory Visit: Payer: Self-pay

## 2023-02-24 ENCOUNTER — Other Ambulatory Visit (HOSPITAL_COMMUNITY): Payer: Self-pay | Admitting: Physician Assistant

## 2023-02-24 ENCOUNTER — Encounter (HOSPITAL_COMMUNITY): Payer: Self-pay | Admitting: Plastic Surgery

## 2023-02-24 ENCOUNTER — Ambulatory Visit (HOSPITAL_BASED_OUTPATIENT_CLINIC_OR_DEPARTMENT_OTHER): Payer: Medicare Other | Admitting: Vascular Surgery

## 2023-02-24 ENCOUNTER — Ambulatory Visit (HOSPITAL_COMMUNITY): Payer: Self-pay | Admitting: Vascular Surgery

## 2023-02-24 ENCOUNTER — Encounter: Payer: Self-pay | Admitting: Internal Medicine

## 2023-02-24 ENCOUNTER — Ambulatory Visit (HOSPITAL_COMMUNITY)
Admission: RE | Admit: 2023-02-24 | Discharge: 2023-02-24 | Disposition: A | Discharge status: Home / Self Care | Payer: Medicare Other | Attending: Plastic Surgery | Admitting: Plastic Surgery

## 2023-02-24 DIAGNOSIS — S81802D Unspecified open wound, left lower leg, subsequent encounter: Secondary | ICD-10-CM

## 2023-02-24 DIAGNOSIS — I509 Heart failure, unspecified: Secondary | ICD-10-CM | POA: Insufficient documentation

## 2023-02-24 DIAGNOSIS — I110 Hypertensive heart disease with heart failure: Secondary | ICD-10-CM | POA: Diagnosis not present

## 2023-02-24 DIAGNOSIS — Z6841 Body Mass Index (BMI) 40.0 and over, adult: Secondary | ICD-10-CM | POA: Insufficient documentation

## 2023-02-24 DIAGNOSIS — F32A Depression, unspecified: Secondary | ICD-10-CM | POA: Diagnosis not present

## 2023-02-24 DIAGNOSIS — R519 Headache, unspecified: Secondary | ICD-10-CM | POA: Diagnosis not present

## 2023-02-24 DIAGNOSIS — E669 Obesity, unspecified: Secondary | ICD-10-CM | POA: Diagnosis not present

## 2023-02-24 DIAGNOSIS — M1990 Unspecified osteoarthritis, unspecified site: Secondary | ICD-10-CM | POA: Insufficient documentation

## 2023-02-24 DIAGNOSIS — F419 Anxiety disorder, unspecified: Secondary | ICD-10-CM | POA: Insufficient documentation

## 2023-02-24 DIAGNOSIS — I428 Other cardiomyopathies: Secondary | ICD-10-CM | POA: Insufficient documentation

## 2023-02-24 DIAGNOSIS — L7682 Other postprocedural complications of skin and subcutaneous tissue: Secondary | ICD-10-CM | POA: Insufficient documentation

## 2023-02-24 DIAGNOSIS — E119 Type 2 diabetes mellitus without complications: Secondary | ICD-10-CM | POA: Diagnosis not present

## 2023-02-24 DIAGNOSIS — F418 Other specified anxiety disorders: Secondary | ICD-10-CM

## 2023-02-24 DIAGNOSIS — S81802A Unspecified open wound, left lower leg, initial encounter: Secondary | ICD-10-CM | POA: Diagnosis not present

## 2023-02-24 DIAGNOSIS — S81002D Unspecified open wound, left knee, subsequent encounter: Secondary | ICD-10-CM | POA: Diagnosis not present

## 2023-02-24 DIAGNOSIS — Z87891 Personal history of nicotine dependence: Secondary | ICD-10-CM | POA: Diagnosis not present

## 2023-02-24 DIAGNOSIS — I5042 Chronic combined systolic (congestive) and diastolic (congestive) heart failure: Secondary | ICD-10-CM | POA: Diagnosis not present

## 2023-02-24 DIAGNOSIS — Z7984 Long term (current) use of oral hypoglycemic drugs: Secondary | ICD-10-CM | POA: Diagnosis not present

## 2023-02-24 HISTORY — PX: IRRIGATION AND DEBRIDEMENT KNEE: SHX5185

## 2023-02-24 HISTORY — PX: INCISION AND DRAINAGE OF WOUND: SHX1803

## 2023-02-24 LAB — GLUCOSE, CAPILLARY: Glucose-Capillary: 116 mg/dL — ABNORMAL HIGH (ref 70–99)

## 2023-02-24 SURGERY — IRRIGATION AND DEBRIDEMENT WOUND
Anesthesia: General | Site: Knee | Laterality: Left

## 2023-02-24 MED ORDER — CHLORHEXIDINE GLUCONATE CLOTH 2 % EX PADS: 6.0000 | MEDICATED_PAD | Freq: Once | CUTANEOUS | Status: DC

## 2023-02-24 MED ORDER — LIDOCAINE 2% (20 MG/ML) 5 ML SYRINGE
INTRAMUSCULAR | Status: DC | PRN
  Administered 2023-02-24: 100 mg via INTRAVENOUS

## 2023-02-24 MED ORDER — PROPOFOL 10 MG/ML IV BOLUS
INTRAVENOUS | Status: DC | PRN
  Administered 2023-02-24: 100 mg via INTRAVENOUS

## 2023-02-24 MED ORDER — VASHE WOUND IRRIGATION OPTIME
TOPICAL | Status: DC | PRN
  Administered 2023-02-24: 34 [oz_av]

## 2023-02-24 MED ORDER — MIDAZOLAM HCL 2 MG/2ML IJ SOLN
INTRAMUSCULAR | Status: AC
  Filled 2023-02-24: qty 2

## 2023-02-24 MED ORDER — ACETAMINOPHEN 10 MG/ML IV SOLN
1000.0000 mg | Freq: Once | INTRAVENOUS | Status: AC
  Administered 2023-02-24: 1000 mg via INTRAVENOUS

## 2023-02-24 MED ORDER — ALPRAZOLAM 0.5 MG PO TABS: 0.5000 mg | ORAL_TABLET | Freq: Every evening | ORAL | 0 refills | Status: DC | PRN

## 2023-02-24 MED ORDER — 0.9 % SODIUM CHLORIDE (POUR BTL) OPTIME
TOPICAL | Status: DC | PRN
  Administered 2023-02-24 (×5): 1000 mL

## 2023-02-24 MED ORDER — PROPOFOL 10 MG/ML IV BOLUS
INTRAVENOUS | Status: AC
  Filled 2023-02-24: qty 20

## 2023-02-24 MED ORDER — SODIUM CHLORIDE 0.9 % IV SOLN: INTRAVENOUS | Status: DC | PRN

## 2023-02-24 MED ORDER — HYDROMORPHONE HCL 1 MG/ML IJ SOLN
INTRAMUSCULAR | Status: DC | PRN
  Administered 2023-02-24: .5 mg via INTRAVENOUS

## 2023-02-24 MED ORDER — FENTANYL CITRATE (PF) 100 MCG/2ML IJ SOLN
INTRAMUSCULAR | Status: AC
  Filled 2023-02-24: qty 2

## 2023-02-24 MED ORDER — CHLORHEXIDINE GLUCONATE 0.12 % MT SOLN
OROMUCOSAL | Status: AC
  Administered 2023-02-24: 15 mL via OROMUCOSAL
  Filled 2023-02-24: qty 15

## 2023-02-24 MED ORDER — LIDOCAINE-EPINEPHRINE 1 %-1:100000 IJ SOLN
INTRAMUSCULAR | Status: AC
  Filled 2023-02-24: qty 1

## 2023-02-24 MED ORDER — FENTANYL CITRATE (PF) 100 MCG/2ML IJ SOLN
25.0000 ug | INTRAMUSCULAR | Status: DC | PRN
  Administered 2023-02-24 (×3): 50 ug via INTRAVENOUS

## 2023-02-24 MED ORDER — CIPROFLOXACIN IN D5W 400 MG/200ML IV SOLN
INTRAVENOUS | Status: AC
  Filled 2023-02-24: qty 200

## 2023-02-24 MED ORDER — PHENYLEPHRINE HCL (PRESSORS) 10 MG/ML IV SOLN
INTRAVENOUS | Status: DC | PRN
  Administered 2023-02-24: 160 ug via INTRAVENOUS

## 2023-02-24 MED ORDER — OXYCODONE HCL 5 MG PO TABS
ORAL_TABLET | ORAL | Status: AC
  Filled 2023-02-24: qty 1

## 2023-02-24 MED ORDER — AMISULPRIDE (ANTIEMETIC) 5 MG/2ML IV SOLN: 10.0000 mg | Freq: Once | INTRAVENOUS | Status: DC | PRN

## 2023-02-24 MED ORDER — ACETAMINOPHEN 10 MG/ML IV SOLN
INTRAVENOUS | Status: AC
  Filled 2023-02-24: qty 100

## 2023-02-24 MED ORDER — OXYCODONE HCL 5 MG/5ML PO SOLN: 5.0000 mg | Freq: Once | ORAL | Status: AC | PRN

## 2023-02-24 MED ORDER — HYDROMORPHONE HCL 1 MG/ML IJ SOLN
INTRAMUSCULAR | Status: AC
  Filled 2023-02-24: qty 0.5

## 2023-02-24 MED ORDER — CIPROFLOXACIN IN D5W 400 MG/200ML IV SOLN
400.0000 mg | INTRAVENOUS | Status: AC
  Administered 2023-02-24: 400 mg via INTRAVENOUS

## 2023-02-24 MED ORDER — OXYCODONE HCL 5 MG PO TABS
5.0000 mg | ORAL_TABLET | Freq: Once | ORAL | Status: AC | PRN
  Administered 2023-02-24: 5 mg via ORAL

## 2023-02-24 MED ORDER — MIDAZOLAM HCL 2 MG/2ML IJ SOLN
INTRAMUSCULAR | Status: DC | PRN
  Administered 2023-02-24: 2 mg via INTRAVENOUS

## 2023-02-24 MED ORDER — CHLORHEXIDINE GLUCONATE 0.12 % MT SOLN
15.0000 mL | OROMUCOSAL | Status: AC
  Filled 2023-02-24: qty 15

## 2023-02-24 SURGICAL SUPPLY — 58 items
APPLICATOR COTTON TIP 6 STRL (MISCELLANEOUS) IMPLANT
APPLICATOR COTTON TIP 6IN STRL (MISCELLANEOUS)
BAG COUNTER SPONGE SURGICOUNT (BAG) ×3 IMPLANT
BAG DECANTER FOR FLEXI CONT (MISCELLANEOUS) IMPLANT
BENZOIN TINCTURE PRP APPL 2/3 (GAUZE/BANDAGES/DRESSINGS) ×3 IMPLANT
BNDG ELASTIC 6X10 VLCR STRL LF (GAUZE/BANDAGES/DRESSINGS) IMPLANT
BNDG GAUZE DERMACEA FLUFF 4 (GAUZE/BANDAGES/DRESSINGS) IMPLANT
CANISTER SUCT 3000ML PPV (MISCELLANEOUS) ×3 IMPLANT
CLEANSER WND VASHE INSTL 34OZ (WOUND CARE) IMPLANT
CNTNR URN SCR LID CUP LEK RST (MISCELLANEOUS) IMPLANT
COVER SURGICAL LIGHT HANDLE (MISCELLANEOUS) ×3 IMPLANT
DRAIN CHANNEL 19F RND (DRAIN) IMPLANT
DRAIN JP 10F RND SILICONE (MISCELLANEOUS) IMPLANT
DRAPE HALF SHEET 40X57 (DRAPES) IMPLANT
DRAPE IMP U-DRAPE 54X76 (DRAPES) ×3 IMPLANT
DRAPE INCISE IOBAN 66X45 STRL (DRAPES) IMPLANT
DRAPE LAPAROSCOPIC ABDOMINAL (DRAPES) IMPLANT
DRAPE LAPAROTOMY 100X72 PEDS (DRAPES) ×3 IMPLANT
DRSG ADAPTIC 3X8 NADH LF (GAUZE/BANDAGES/DRESSINGS) IMPLANT
DRSG CALCIUM ALGINATE 4X4 (GAUZE/BANDAGES/DRESSINGS) IMPLANT
DRSG CUTIMED SORBACT 7X9 (GAUZE/BANDAGES/DRESSINGS) IMPLANT
DRSG VAC GRANUFOAM LG (GAUZE/BANDAGES/DRESSINGS) IMPLANT
DRSG VAC GRANUFOAM MED (GAUZE/BANDAGES/DRESSINGS) IMPLANT
DRSG VAC GRANUFOAM SM (GAUZE/BANDAGES/DRESSINGS) IMPLANT
ELECT CAUTERY BLADE 6.4 (BLADE) IMPLANT
ELECT REM PT RETURN 9FT ADLT (ELECTROSURGICAL) ×2
ELECTRODE REM PT RTRN 9FT ADLT (ELECTROSURGICAL) ×3 IMPLANT
GAUZE PAD ABD 8X10 STRL (GAUZE/BANDAGES/DRESSINGS) IMPLANT
GAUZE SPONGE 4X4 12PLY STRL (GAUZE/BANDAGES/DRESSINGS) IMPLANT
GLOVE BIO SURGEON STRL SZ8 (GLOVE) ×3 IMPLANT
GLOVE BIOGEL PI IND STRL 8 (GLOVE) ×3 IMPLANT
GOWN STRL REUS W/ TWL LRG LVL3 (GOWN DISPOSABLE) ×9 IMPLANT
GOWN STRL REUS W/TWL XL LVL3 (GOWN DISPOSABLE) ×3 IMPLANT
GRAFT MYRIAD 5 LAYER 7X10 (Graft) IMPLANT
KIT BASIN OR (CUSTOM PROCEDURE TRAY) ×3 IMPLANT
KIT TURNOVER KIT B (KITS) ×3 IMPLANT
NDL HYPO 25GX1X1/2 BEV (NEEDLE) ×3 IMPLANT
NEEDLE HYPO 25GX1X1/2 BEV (NEEDLE) ×2
NS IRRIG 1000ML POUR BTL (IV SOLUTION) ×3 IMPLANT
PACK GENERAL/GYN (CUSTOM PROCEDURE TRAY) ×3 IMPLANT
PACK UNIVERSAL I (CUSTOM PROCEDURE TRAY) ×3 IMPLANT
PAD ARMBOARD 7.5X6 YLW CONV (MISCELLANEOUS) ×6 IMPLANT
STAPLER VISISTAT (STAPLE) IMPLANT
STAPLER VISISTAT 35W (STAPLE) ×3 IMPLANT
SURGILUBE 2OZ TUBE FLIPTOP (MISCELLANEOUS) IMPLANT
SUT MNCRL AB 3-0 PS2 27 (SUTURE) IMPLANT
SUT MNCRL AB 4-0 PS2 18 (SUTURE) IMPLANT
SUT MON AB 2-0 CT1 36 (SUTURE) IMPLANT
SUT MON AB 5-0 PS2 18 (SUTURE) IMPLANT
SUT PROLENE 4 0 PS 2 18 (SUTURE) IMPLANT
SUT VIC AB 5-0 PS2 18 (SUTURE) IMPLANT
SUT VICRYL 3 0 (SUTURE) IMPLANT
SWAB COLLECTION DEVICE MRSA (MISCELLANEOUS) IMPLANT
SWAB CULTURE ESWAB REG 1ML (MISCELLANEOUS) IMPLANT
SYR BULB IRRIG 60ML STRL (SYRINGE) IMPLANT
SYR CONTROL 10ML LL (SYRINGE) ×3 IMPLANT
TOWEL GREEN STERILE (TOWEL DISPOSABLE) ×3 IMPLANT
UNDERPAD 30X36 HEAVY ABSORB (UNDERPADS AND DIAPERS) ×3 IMPLANT

## 2023-02-24 NOTE — Anesthesia Postprocedure Evaluation (Signed)
Anesthesia Post Note  Patient: [name]  Procedure(s) Performed: application of tissue replacement matrix of left patellar tendon (Left: Knee) APPLICATION OF SKIN SUBSTITUTE (Left: Knee) IRRIGATION AND DEBRIDEMENT KNEE (Knee)     Anesthesia Type: General Anesthetic complications: no   No notable events documented.  Last Vitals:  Vitals:   02/24/23 1530 02/24/23 1545  BP: 126/62 131/88  Pulse: 77 74  Resp: 19 10  Temp:  36.6 C  SpO2: 94% 94%    Last Pain:  Vitals:   02/24/23 1545  TempSrc:   PainSc: Asleep                 Rashaud Ybarbo

## 2023-02-24 NOTE — Transfer of Care (Signed)
Immediate Anesthesia Transfer of Care Note  Patient: [name]  Procedure(s) Performed: application of tissue replacement matrix of left patellar tendon (Left: Knee) APPLICATION OF SKIN SUBSTITUTE (Left: Knee) IRRIGATION AND DEBRIDEMENT KNEE (Knee)  Patient Location: PACU  Anesthesia Type:General  Level of Consciousness: awake, alert , and oriented  Airway & Oxygen Therapy: Patient Spontanous Breathing  Post-op Assessment: Report given to RN and Post -op Vital signs reviewed and stable  Post vital signs: Reviewed and stable  Last Vitals:  Vitals Value Taken Time  BP 132/85 02/24/23 1453  Temp 36.4 C 02/24/23 1453  Pulse 83 02/24/23 1457  Resp 12 02/24/23 1457  SpO2 95 % 02/24/23 1457  Vitals shown include unfiled device data.  Last Pain:  Vitals:   02/24/23 1101  TempSrc:   PainSc: 6          Complications: No notable events documented.

## 2023-02-24 NOTE — Telephone Encounter (Signed)
Messgae acknowledged an reviewed. Medication sent to pharmacy of choice.

## 2023-02-24 NOTE — Progress Notes (Signed)
Anesthesia questioned if patient could take her home dose of Xanax at this time. Anesthesia ok for patient to take med now. Husband gave Xanax at 1120

## 2023-02-24 NOTE — Progress Notes (Signed)
Provider was contacted by Rosita Kea. Ladona Ridgel, RN regarding patient's request for alprazolam refill. Patient was last filled this medication on 12/27/2022 and is out. Patient's medication to be e-prescribed to pharmacy of choice. Patient's next appointment with this provider is scheduled for 03/11/2023.

## 2023-02-24 NOTE — Interval H&P Note (Signed)
History and Physical Interval Note: No change in exam or indication for surgery Left leg marked with her concurrence All questions answered Will proceed with placement of tissue matrix  02/24/2023 1:26 PM  [name]  has presented today for surgery, with the diagnosis of knee wound.  The various methods of treatment have been discussed with the patient and family. After consideration of risks, benefits and other options for treatment, the patient has consented to  Procedure(s): application of tissue replacement matrix,left patellar tendon, possible wound debridement (Left) APPLICATION OF SKIN SUBSTITUTE (Left) as a surgical intervention.  The patient's history has been reviewed, patient examined, no change in status, stable for surgery.  I have reviewed the patient's chart and labs.  Questions were answered to the patient's satisfaction.     [name]

## 2023-02-24 NOTE — Discharge Instructions (Addendum)
Avoid over-bending or overuse of your left knee.   Leave dressings in place until Thursday 02/27/2023. On Thursday 02/27/2023, you may remove the ACE wrap, kerlex, ABD pads and moist gauze. DO NOT REMOVE THE GREEN DRESSING (SORBACT). Apply KY jelly over the green sorbact, followed by vashe-soaked gauze, ABD pads, kerlex and ACE wrap. Change dressings daily.

## 2023-02-24 NOTE — Anesthesia Procedure Notes (Signed)
Procedure Name: LMA Insertion Date/Time: 02/24/2023 1:34 PM  Performed by: Hali Marry, CRNAPre-anesthesia Checklist: Patient identified, Emergency Drugs available, Suction available and Patient being monitored Patient Re-evaluated:Patient Re-evaluated prior to induction Oxygen Delivery Method: Circle system utilized Preoxygenation: Pre-oxygenation with 100% oxygen Induction Type: IV induction Ventilation: Mask ventilation without difficulty LMA: LMA flexible inserted LMA Size: 4.0 Number of attempts: 1 Placement Confirmation: ETT inserted through vocal cords under direct vision, positive ETCO2 and breath sounds checked- equal and bilateral Tube secured with: Tape Dental Injury: Teeth and Oropharynx as per pre-operative assessment

## 2023-02-24 NOTE — Telephone Encounter (Signed)
Message acknowledged and reviewed.

## 2023-02-24 NOTE — Telephone Encounter (Signed)
Medication refill - Call from patient stating she had missed her last appointment after having a medical procedure done that date on her foot and could not do the virtual appt then. Patient has rescheduled for 03/11/23 and is requesting a new Alprazolam 0.5 mg order be sent into her [facility] on [address] as soon as possible.  Patient stated she was not overtaking the medication, last prescribed 12/27/22 for up to 2 a day but is now out and in need of a new order due to ongoing problems managing anxiety and still dealing with some medical issues in her legs.  Patient stated she had called with request the previous week but had not heard anything back and agreed to reach out to [facility], [place] to see if he would send in a new order.  Informed patient this nurse could not guarantee it would be this evening as is already after 5 pm but would see how soon her request could be addressed and requested if patient had not heard anything back by noon on 02/25/23 to call and request to speak to this nurse again.

## 2023-02-24 NOTE — Op Note (Signed)
DATE OF OPERATION: 02/24/2023  LOCATION: [facility] Main operating Room  PREOPERATIVE DIAGNOSIS: Left lower extremity wound  POSTOPERATIVE DIAGNOSIS: Same  PROCEDURE: Wound debridement and placement of tissue replacement matrix  SURGEON: Loren Racer, MD  ASSISTANT: Caroline More  EBL: 20 cc  CONDITION: Stable  COMPLICATIONS: None  INDICATION: The patient, [name], is a 57 y.o. female born on 1966/01/17, is here for ongoing treatment of a left leg wound sustained during placement of an intraosseous infusion device.  Patient's wound is healing however the patellar tendon remains exposed.  The wound is to be debrided and placement of the tissue matrix over the tendon  PROCEDURE DETAILS:  The patient was seen prior to surgery and marked.   IV antibiotics were given. The patient was taken to the operating room and given a general anesthetic. A standard time out was performed and all information was confirmed by those in the room. SCDs were placed.   The left leg prepped and draped sterilely.  The wound measured 19 cm in length and 15 cm in greatest width.  An area approximately 7 x 7 cm was debrided sharply with scissors.  This was a superficial debridement as the wound depth is less than 5 mm except at the uppermost inner portion of the wound which is approximately 1 cm in depth.  After I was happy with the appearance of the wound it was irrigated copiously with warm normal saline.  There is granulation tissue throughout the entire wound except over the patellar tendon.  This area measured 4 x 5 cm.  There was an additional area at the upper inner portion of the wound which measured 3 x 2 cm and this was the area that was approximately 1 cm deep.  I elected to put myriad over both of these areas.  A 7 x 10 5 layer portion of myriad was used and trimmed to fit each of the wounds.  The the myriad was then held in place with interrupted 4-0 Monocryl sutures.  The entire surgical wound was covered with  a portion of Sorbact.  The Sorbact was held in place with interrupted 4-0 Prolene sutures.  Dressings consisting of surgical lube Vashe soaked gauze ABDs Kerlix and an Ace wrap were then placed.  The patient was awakened from anesthesia without incident transferred to the recovery room in good condition.  She will stay in the recovery room for several hours to be observed and then likely discharged home.  All instrument needle and sponge counts were reported as correct and there were no complications noted. The patient was allowed to wake up and taken to recovery room in stable condition at the end of the case. The family was notified at the end of the case.   The advanced practice practitioner (APP) assisted throughout the case.  The APP was essential in retraction and counter traction when needed to make the case progress smoothly.  This retraction and assistance made it possible to see the tissue plans for the procedure.  The assistance was needed for blood control, tissue re-approximation and assisted with closure of the incision site.

## 2023-02-25 ENCOUNTER — Encounter (HOSPITAL_COMMUNITY): Payer: Self-pay | Admitting: Plastic Surgery

## 2023-02-25 ENCOUNTER — Encounter: Payer: Medicare Other | Admitting: Surgical

## 2023-02-27 ENCOUNTER — Telehealth: Payer: Self-pay

## 2023-02-27 ENCOUNTER — Encounter: Payer: Self-pay | Admitting: Allergy

## 2023-02-27 ENCOUNTER — Ambulatory Visit: Payer: Medicare Other | Admitting: Allergy

## 2023-02-27 ENCOUNTER — Ambulatory Visit: Payer: Medicare Other

## 2023-02-27 VITALS — BP 94/68 | HR 84 | Temp 98.2°F | Resp 18 | Ht 64.0 in | Wt 266.8 lb

## 2023-02-27 DIAGNOSIS — T8089XA Other complications following infusion, transfusion and therapeutic injection, initial encounter: Secondary | ICD-10-CM | POA: Diagnosis not present

## 2023-02-27 DIAGNOSIS — Z880 Allergy status to penicillin: Secondary | ICD-10-CM

## 2023-02-27 DIAGNOSIS — S81802D Unspecified open wound, left lower leg, subsequent encounter: Secondary | ICD-10-CM | POA: Diagnosis not present

## 2023-02-27 DIAGNOSIS — I96 Gangrene, not elsewhere classified: Secondary | ICD-10-CM | POA: Diagnosis not present

## 2023-02-27 DIAGNOSIS — T8189XA Other complications of procedures, not elsewhere classified, initial encounter: Secondary | ICD-10-CM | POA: Diagnosis not present

## 2023-02-27 DIAGNOSIS — I5032 Chronic diastolic (congestive) heart failure: Secondary | ICD-10-CM | POA: Diagnosis not present

## 2023-02-27 DIAGNOSIS — I5023 Acute on chronic systolic (congestive) heart failure: Secondary | ICD-10-CM | POA: Diagnosis not present

## 2023-02-27 DIAGNOSIS — I152 Hypertension secondary to endocrine disorders: Secondary | ICD-10-CM | POA: Diagnosis not present

## 2023-02-27 NOTE — Progress Notes (Signed)
 New Patient Note  RE: [name] MRN: [id number] DOB: 14-Oct-1965 Date of Office Visit: 02/27/2023  Consult requested by: Joshua Debby CROME, MD Primary care provider: Joshua Debby CROME, MD  Chief Complaint: Other (Penicillin Allergy - has been having issues with her leg that has an infection. Has been avoiding penicillin since she was a baby. )  History of Present Illness: I had the pleasure of seeing [name] for initial evaluation at the [facility] on 02/28/2023. She is a 58 y.o. female, who is referred here by Dr. Dea (ID) for the evaluation of penicillin allergy . She is accompanied today by her spouse who provided/contributed to the history.   Discussed the use of AI scribe software for clinical note transcription with the patient, who gave verbal consent to proceed.  The patient, with a complicated medical history (hypertension, postpartum cardiomyopathy, chronic systolic CHF last EF 30-35%, diabetes mellitus type 2, depression, OSA, and morbid obesity), was referred by an infectious disease specialist due to a persistent infection in the left leg. The infection originated from an IO site following a cardiac arrest event in October 2024.  Patient had weeping wounds with skin necrosis during hospitalization.   The patient was prescribed antibiotics, including Cipro  and Linezolid , by her infectious disease specialist.  The patient underwent surgery to cover an exposed tendon in the infected area. Post-surgery, the wound was reported to be improving by the surgeon. ID would like to transition to amoxicillin , but the patient reported a penicillin allergy  since infancy.  The patient's allergy  to penicillin is based on information relayed by her mother who has passed away, and she has avoided penicillin and amoxicillin  throughout her adult life. She has no known history of reactions to other antibiotics.   The patient is currently on a regimen of heart and diabetes  medications, some of which were prescribed following the recent hospitalization. She also wears a LifeVest due to recent cardiac arrest events.     Drug reaction: Reaction to penicillin which occurred as a baby.  Patient was told all her life by her mother that she was allergic. Patient's mother passed away and she is not sure what kind of reaction she had.  02/13/2023 ID visit: # Penicillin allergy   - She reports her mother told her she was allergic as a child and she has no other details - Urgent amb referral allergy  for PO amox challenge vs skin testing. Can transition to PO amoxicillin  after allergy  r/o for above  Assessment and Plan: [name] is a 58 y.o. female with: Penicillin allergy  Reported penicillin allergy  since infancy, but history is unclear. No other reactions to antibiotics. Infectious disease team considering penicillin-based antibiotics (amoxicillin ) from her current regimen of linezolid .  Schedule for penicillin skin testing and drug challenge in our [place] office given her complicated medical history and its proximity to the hospital. Coordinate with infectious disease team regarding the dose of amoxicillin  for the challenge - reached out to Dr. Dea and awaiting response.  Explained the risks (including anaphylaxis that may stress her heart) associated with the procedure, especially considering the patient's comorbid conditions including her recent cardiac arrest.  Drug challenge instructions:  You must be off antihistamines for 3-5 days before. Must be in good health and not ill. No vaccines/injections within the past 7 days. Plan on being in the office for 2-3 hours and must bring in the drug you want to do the oral challenge for - will send in prescription to pick  up a few days before.  Wound of left lower extremity, subsequent encounter S/p IO placement following cardiac arrest. Followed by plastics and ID. Finished cipro  and currently on linezolid .    Return in about 25 days (around 03/24/2023) for Drug challenge.  No orders of the defined types were placed in this encounter.  Lab Orders  No laboratory test(s) ordered today    Other allergy  screening: Asthma: no Rhino conjunctivitis:  Some nasal congestion at night. Food allergy : no Hymenoptera allergy : no Urticaria: no Eczema:yes History of recurrent infections suggestive of immunodeficency: no  Diagnostics: None.   Past Medical History: Patient Active Problem List   Diagnosis Date Noted   Morbid obesity (HCC) 02/18/2023   Medication management 02/13/2023   Penicillin allergy  02/13/2023   Wound of left lower extremity 02/06/2023   S/P debridement 02/06/2023   Dysuria 01/31/2023   Encounter for palliative care involving management of pain 01/28/2023   Acute post-traumatic wound infection 01/27/2023   Nasal congestion 01/16/2023   Deficiency anemia 01/15/2023   Need for immunization against influenza 01/15/2023   Diabetes mellitus (HCC) 12/19/2022   Ventricular fibrillation (HCC) 12/10/2022   Onychomycosis 11/15/2022   Anxiety 10/27/2022   Rosacea, acne 08/13/2022   Primary osteoarthritis of both knees 03/06/2022   Bilateral ovarian cysts 03/06/2022   Major depressive disorder, recurrent episode, severe with anxious distress (HCC) 08/09/2021   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   Need for varicella vaccine 08/09/2021   Urge incontinence 08/01/2021   Tinea corporis 03/14/2021   Hyperlipidemia LDL goal <100 03/01/2021   DDD (degenerative disc disease), lumbar 08/08/2020   Non-seasonal allergic rhinitis due to pollen 07/25/2020   Chronic idiopathic constipation 07/25/2020   Visit for screening mammogram 02/11/2020   Chronic bilateral low back pain without sciatica 02/10/2020   Spondylosis without myelopathy or radiculopathy, lumbar region 12/04/2017   Type 2 diabetes mellitus with hyperglycemia (HCC)    Normal coronary arteries  04/17/2016   OSA on CPAP 10/28/2014   Chronic combined systolic and diastolic heart failure (HCC) 12/06/2013   Hyperlipidemia associated with type 2 diabetes mellitus (HCC), on Zocor  04/04/2011   Hypertension associated with diabetes (HCC) 06/03/2008   Morbid obesity with BMI of 50.0-59.9, adult (HCC) 07/12/2006   NICM (nonischemic cardiomyopathy) (HCC) 07/12/2006   Migraine without status migrainosus, not intractable 07/12/2006   Past Medical History:  Diagnosis Date   ADD (attention deficit disorder)    Anxiety    Arthritis    both knees right worse than left   Carpal tunnel syndrome of right wrist    Depression    Essential hypertension, benign    Gallstones    History of smoking 06/22/2016   Hyperlipidemia associated with type 2 diabetes mellitus (HCC), on Zocor  04/04/2011   Hypertension associated with diabetes (HCC) 06/03/2008   Migraines    Morbid obesity (HCC)    Morbid obesity with BMI of 50.0-59.9, adult (HCC) 07/12/2006   NICM (nonischemic cardiomyopathy) (HCC) 07/12/2006   01/16/18 ECHO:    - Procedure narrative: Transthoracic echocardiography. Image   quality was suboptimal. The study was technically difficult.   Intravenous contrast (Definity ) was administered. - Left ventricle: The cavity size was moderately dilated. Wall   thickness was increased in a pattern of mild LVH. Systolic   function was moderately to severely reduced. The estimated   ejection fraction w   Normal coronary arteries 04/17/2016   OSA on CPAP 10/28/2014   Spinal stenosis    back pain   Spondylosis  without myelopathy or radiculopathy, lumbar region 12/04/2017   Type 2 diabetes mellitus with hyperglycemia [facility])    Past Surgical History: Past Surgical History:  Procedure Laterality Date   CHOLECYSTECTOMY     DILATATION & CURETTAGE/HYSTEROSCOPY WITH MYOSURE N/A 10/31/2017   Procedure: DILATATION & CURETTAGE/HYSTEROSCOPY WITH MYOSURE;  Surgeon: Jannis Kate Norris, MD;  Location: [facility] ORS;   Service: Gynecology;  Laterality: N/A;   HYSTEROSCOPY WITH D & C N/A 05/07/2022   Procedure: DILATATION AND CURETTAGE /HYSTEROSCOPY;  Surgeon: Jeralyn Crutch, MD;  Location: [facility] OR;  Service: Gynecology;  Laterality: N/A;   INCISION AND DRAINAGE OF WOUND Left 02/06/2023   Procedure: debridement of left leg wound, application of myriad;  Surgeon: Waddell Leonce NOVAK, MD;  Location: [facility] OR;  Service: Plastics;  Laterality: Left;   INCISION AND DRAINAGE OF WOUND Left 02/24/2023   Procedure: application of tissue replacement matrix of left patellar tendon;  Surgeon: Waddell Leonce NOVAK, MD;  Location: [facility] OR;  Service: Plastics;  Laterality: Left;   IRRIGATION AND DEBRIDEMENT KNEE  02/24/2023   Procedure: IRRIGATION AND DEBRIDEMENT KNEE;  Surgeon: Waddell Leonce NOVAK, MD;  Location: [facility] OR;  Service: Plastics;;   life vest     RIGHT/LEFT HEART CATH AND CORONARY ANGIOGRAPHY N/A 04/11/2016   Procedure: Right/Left Heart Cath and Coronary Angiography;  Surgeon: Lonni JONETTA Cash, MD;  Location: [facility] INVASIVE CV LAB;  Service: Cardiovascular;  Laterality: N/A;   RIGHT/LEFT HEART CATH AND CORONARY ANGIOGRAPHY N/A 12/09/2022   Procedure: RIGHT/LEFT HEART CATH AND CORONARY ANGIOGRAPHY;  Surgeon: Ladona Heinz, MD;  Location: [facility] INVASIVE CV LAB;  Service: Cardiovascular;  Laterality: N/A;   sonogram for blood clots     no blockages   Medication List:  Current Outpatient Medications  Medication Sig Dispense Refill   acetaminophen  (TYLENOL ) 325 MG tablet Take 1-2 tablets (325-650 mg total) by mouth every 4 (four) hours as needed for mild pain (pain score 1-3). 100 tablet 0   ALPRAZolam  (XANAX ) 0.5 MG tablet Take 1 tablet (0.5 mg total) by mouth at bedtime as needed for anxiety (if anxiety unrelieved by hydroxyzine ). 60 tablet 0   atorvastatin  (LIPITOR ) 80 MG tablet Take 1 tablet (80 mg total) by mouth daily. 90 tablet 0   Blood Glucose Monitoring Suppl DEVI 1 each by Does not apply route in the morning, at noon,  and at bedtime. May substitute to any manufacturer covered by patient's insurance. 1 each 0   dapagliflozin  propanediol (FARXIGA ) 10 MG TABS tablet Take 1 tablet (10 mg total) by mouth daily. 90 tablet 1   fluticasone  (FLONASE ) 50 MCG/ACT nasal spray Place 2 sprays into both nostrils daily. (Patient taking differently: Place 2 sprays into both nostrils daily as needed for rhinitis.) 48 g 0   furosemide  (LASIX ) 40 MG tablet Take 1 tablet (40 mg total) by mouth daily. 90 tablet 1   Glucose Blood (BLOOD GLUCOSE TEST STRIPS) STRP 1 each by In Vitro route in the morning, at noon, and at bedtime. May substitute to any manufacturer covered by patient's insurance. 100 strip 11   linezolid  (ZYVOX ) 600 MG tablet Take 1 tablet (600 mg total) by mouth 2 (two) times daily. 30 tablet 0   losartan  (COZAAR ) 25 MG tablet Take 0.5 tablets (12.5 mg total) by mouth daily. 90 tablet 1   magnesium  oxide (MAG-OX) 400 MG tablet Take 1 tablet (400 mg total) by mouth at bedtime. 90 tablet 1   metFORMIN  (GLUCOPHAGE -XR) 500 MG 24 hr tablet Take 1 tablet (500 mg  total) by mouth 2 (two) times daily with a meal. 180 tablet 1   metoprolol  succinate (TOPROL -XL) 50 MG 24 hr tablet Take 1 tablet (50 mg total) by mouth daily. Take with or immediately following a meal. 90 tablet 1   Oxycodone  HCl 10 MG TABS Take 1 tablet (10 mg total) by mouth 3 (three) times daily as needed. (Patient taking differently: Take 5-10 mg by mouth every 6 (six) hours as needed (pain).) 90 tablet 0   polyethylene glycol powder (GLYCOLAX /MIRALAX ) 17 GM/SCOOP powder Take 1 capful (17 g) in water 2 (two) times daily. (Patient taking differently: Take 17 g by mouth daily.) 238 g 0   potassium chloride  SA (KLOR-CON  M) 20 MEQ tablet Take 1 tablet (20 mEq total) by mouth daily. 90 tablet 1   spironolactone  (ALDACTONE ) 25 MG tablet Take 0.5 tablets (12.5 mg total) by mouth daily. 90 tablet 1   topiramate  (TOPAMAX ) 25 MG tablet Take 1 tablet (25 mg total) by mouth at  bedtime. 30 tablet 0   carvedilol  (COREG ) 25 MG tablet Take 25 mg by mouth 2 (two) times daily. (Patient not taking: Reported on 02/27/2023)     collagenase  (SANTYL ) 250 UNIT/GM ointment Apply 1 Application topically daily. (Patient not taking: Reported on 02/27/2023) 15 g 0   ketoconazole  (NIZORAL ) 2 % cream Apply 1 Application topically daily. Apply 1gm to skin on plantar aspect of affected foot, and massage in well.  Apply once daily. (Patient not taking: Reported on 02/27/2023) 60 g 2   Multiple Vitamin (MULTIVITAMIN WITH MINERALS) TABS tablet Take 1 tablet by mouth daily. (Patient not taking: Reported on 02/27/2023) 30 tablet 0   NON FORMULARY Pt uses cpap nightly (Patient not taking: Reported on 02/27/2023)     nutrition supplement, JUVEN, (JUVEN) PACK Take 1 packet by mouth 2 (two) times daily between meals. (Patient not taking: Reported on 02/27/2023) 60 packet 0   senna-docusate (SENOKOT-S) 8.6-50 MG tablet Take 2 tablets by mouth at bedtime. (Patient not taking: Reported on 02/27/2023) 60 tablet 0   No current facility-administered medications for this visit.   Allergies: Allergies  Allergen Reactions   Fluoxetine Other (See Comments)    More depressed   Cymbalta [Duloxetine Hcl] Other (See Comments)    depressed   Pork-Derived Products    Penicillins Rash    Has patient had a PCN reaction causing immediate rash, facial/tongue/throat swelling, SOB or lightheadedness with hypotension: YES Has patient had a PCN reaction causing severe rash involving mucus membranes or skin necrosis: NO Has patient had a PCN reaction that required hospitalization: YES Has patient had a PCN reaction occurring within the last 10 years: NO If all of the above answers are NO, then may proceed with Cephalosporin use.    Social History: Social History   Socioeconomic History   Marital status: Married    Spouse name: [name]   Number of children: 1   Years of education: Not on file   Highest education level:  Associate degree: academic program  Occupational History   Not on file  Tobacco Use   Smoking status: Former    Current packs/day: 0.00    Types: Cigarettes    Quit date: 02/25/1997    Years since quitting: 26.0   Smokeless tobacco: Never   Tobacco comments:    Married, lives with spouse (when he is not traveling) and son  Vaping Use   Vaping status: Never Used  Substance and Sexual Activity   Alcohol use: No  Drug use: No   Sexual activity: Not Currently    Partners: Male    Birth control/protection: None  Other Topics Concern   Not on file  Social History Narrative   Not on file   Social Drivers of Health   Financial Resource Strain: Low Risk  (02/10/2023)   Overall Financial Resource Strain (CARDIA)    Difficulty of Paying Living Expenses: Not hard at all  Food Insecurity: No Food Insecurity (02/10/2023)   Hunger Vital Sign    Worried About Running Out of Food in the Last Year: Never true    Ran Out of Food in the Last Year: Never true  Transportation Needs: No Transportation Needs (02/10/2023)   PRAPARE - Administrator, Civil Service (Medical): No    Lack of Transportation (Non-Medical): No  Physical Activity: Inactive (02/10/2023)   Exercise Vital Sign    Days of Exercise per Week: 0 days    Minutes of Exercise per Session: 0 min  Stress: Stress Concern Present (02/10/2023)   [employer] of Occupational Health - Occupational Stress Questionnaire    Feeling of Stress : Very much  Social Connections: Socially Isolated (02/10/2023)   Social Connection and Isolation Panel [NHANES]    Frequency of Communication with Friends and Family: Once a week    Frequency of Social Gatherings with Friends and Family: Once a week    Attends Religious Services: Never    Database Administrator or Organizations: No    Attends Banker Meetings: Never    Marital Status: Married   Lives in a condo. Smoking: denies Occupation: not employed - on  disability  Environmental HistorySurveyor, Minerals in the house: no Engineer, Civil (consulting) in the family room: yes Carpet in the bedroom: yes Heating: electric Cooling: central Pet: yes 1 cat    Family History: Family History  Problem Relation Age of Onset   Depression Mother    Anxiety disorder Mother    Diabetes Mother    Hypertension Mother    ADD / ADHD Father    Diabetes Father    Hypertension Father    Colitis Father    Hypertension Other    Diabetes Other    Colitis Other    Alcohol abuse Other    Problem                               Relation Asthma                                   no Eczema                                Sister  Food allergy                           no Allergic rhino conjunctivitis     no  Review of Systems  Constitutional:  Negative for appetite change, chills, fever and unexpected weight change.  HENT:  Positive for congestion. Negative for rhinorrhea.   Eyes:  Negative for itching.  Respiratory:  Negative for cough, chest tightness, shortness of breath and wheezing.   Cardiovascular:  Negative for chest pain.  Gastrointestinal:  Negative for abdominal pain.  Genitourinary:  Negative for difficulty urinating.  Skin:  Positive for rash.   Objective: BP 94/68   Pulse 84   Temp 98.2 F (36.8 C)   Resp 18   Ht 5' 4 (1.626 m) Comment: per pt.  Wt 266 lb 12 oz (121 kg)   LMP  (LMP Unknown)   SpO2 95%   BMI 45.79 kg/m  Body mass index is 45.79 kg/m. Physical Exam Vitals and nursing note reviewed.  Constitutional:      Appearance: She is well-developed.  HENT:     Head: Normocephalic and atraumatic.     Right Ear: Tympanic membrane and external ear normal.     Left Ear: Tympanic membrane and external ear normal.     Nose: Nose normal.     Mouth/Throat:     Mouth: Mucous membranes are moist.     Pharynx: Oropharynx is clear.  Eyes:     Conjunctiva/sclera: Conjunctivae normal.  Cardiovascular:     Rate and Rhythm: Normal rate and regular  rhythm.     Heart sounds: Normal heart sounds. No murmur heard.    No friction rub. No gallop.  Pulmonary:     Effort: Pulmonary effort is normal.     Breath sounds: Normal breath sounds. No wheezing, rhonchi or rales.  Musculoskeletal:     Cervical back: Neck supple.     Comments: Left leg below the knee wrapped in ace bandage.  Skin:    General: Skin is warm.  Neurological:     Mental Status: She is alert and oriented to person, place, and time.    The plan was reviewed with the patient/family, and all questions/concerned were addressed.  It was my pleasure to see [name] today and participate in her care. Please feel free to contact me with any questions or concerns.  Sincerely,  Orlan Cramp, DO Allergy  & Immunology  [facility]  [place] office: [phone] [facility] office: [phone]

## 2023-02-27 NOTE — Telephone Encounter (Signed)
 Copied from CRM [id number]. Topic: Clinical - Home Health Verbal Orders >> Feb 27, 2023  2:10 PM Russell PARAS wrote: Caller/Agency: Rexene, nurse with [facility] Callback Number: [phone] Service Requested: Completed recertification for extension of home health services for additional 60 days. Receiving wound care, medication and disease process education 2 x week. Wanted to update office Frequency: N/A Any new concerns about the patient? No

## 2023-02-27 NOTE — Patient Instructions (Addendum)
 Penicillin allergy : Schedule for penicillin skin testing and drug challenge on 03/26/23 with Arlean (nurse practitioner) in our [place] office.   Drug challenge instructions:  You must be off antihistamines for 3-5 days before. Must be in good health and not ill. No vaccines/injections within the past 7 days. Plan on being in the office for 2-3 hours and must bring in the drug you want to do the oral challenge for - will send in prescription to pick up a few days before. You must call to schedule an appointment and specify it's for a drug challenge.

## 2023-02-28 ENCOUNTER — Encounter: Payer: Self-pay | Admitting: Allergy

## 2023-02-28 ENCOUNTER — Other Ambulatory Visit: Payer: Self-pay

## 2023-02-28 ENCOUNTER — Ambulatory Visit: Payer: Medicare Other | Admitting: Internal Medicine

## 2023-02-28 ENCOUNTER — Encounter: Payer: Self-pay | Admitting: Internal Medicine

## 2023-02-28 ENCOUNTER — Telehealth: Payer: Self-pay | Admitting: Allergy

## 2023-02-28 VITALS — BP 131/88 | HR 88 | Ht 64.0 in | Wt 266.0 lb

## 2023-02-28 DIAGNOSIS — Z880 Allergy status to penicillin: Secondary | ICD-10-CM

## 2023-02-28 DIAGNOSIS — S81802A Unspecified open wound, left lower leg, initial encounter: Secondary | ICD-10-CM

## 2023-02-28 DIAGNOSIS — I429 Cardiomyopathy, unspecified: Secondary | ICD-10-CM

## 2023-02-28 DIAGNOSIS — Z8679 Personal history of other diseases of the circulatory system: Secondary | ICD-10-CM | POA: Diagnosis not present

## 2023-02-28 DIAGNOSIS — S81802D Unspecified open wound, left lower leg, subsequent encounter: Secondary | ICD-10-CM

## 2023-02-28 MED ORDER — LINEZOLID 600 MG PO TABS: 600.0000 mg | ORAL_TABLET | Freq: Two times a day (BID) | ORAL | 0 refills | Status: DC

## 2023-02-28 NOTE — Telephone Encounter (Signed)
 Please call patient and see if she can come in for penicillin skin testing and drug challenge appointment on 1/27 (Monday) in the [place] office under my schedule at 1:30PM as she is a moderate risk for challenge and rather have her on my schedule than any other providers.  Thank you.

## 2023-02-28 NOTE — Telephone Encounter (Signed)
 Verbal orders given

## 2023-02-28 NOTE — Telephone Encounter (Signed)
 Can I give the verbal orders ?

## 2023-02-28 NOTE — Progress Notes (Signed)
 Patient Active Problem List   Diagnosis Date Noted   Morbid obesity (HCC) 02/18/2023   Medication management 02/13/2023   Penicillin allergy  02/13/2023   Wound of left lower extremity 02/06/2023   S/P debridement 02/06/2023   Dysuria 01/31/2023   Encounter for palliative care involving management of pain 01/28/2023   Acute post-traumatic wound infection 01/27/2023   Nasal congestion 01/16/2023   Deficiency anemia 01/15/2023   Need for immunization against influenza 01/15/2023   Diabetes mellitus (HCC) 12/19/2022   Ventricular fibrillation (HCC) 12/10/2022   Onychomycosis 11/15/2022   Anxiety 10/27/2022   Rosacea, acne 08/13/2022   Primary osteoarthritis of both knees 03/06/2022   Bilateral ovarian cysts 03/06/2022   Major depressive disorder, recurrent episode, severe with anxious distress (HCC) 08/09/2021   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   Need for varicella vaccine 08/09/2021   Urge incontinence 08/01/2021   Tinea corporis 03/14/2021   Hyperlipidemia LDL goal <100 03/01/2021   DDD (degenerative disc disease), lumbar 08/08/2020   Non-seasonal allergic rhinitis due to pollen 07/25/2020   Chronic idiopathic constipation 07/25/2020   Visit for screening mammogram 02/11/2020   Chronic bilateral low back pain without sciatica 02/10/2020   Spondylosis without myelopathy or radiculopathy, lumbar region 12/04/2017   Type 2 diabetes mellitus with hyperglycemia (HCC)    Normal coronary arteries 04/17/2016   OSA on CPAP 10/28/2014   Chronic combined systolic and diastolic heart failure (HCC) 12/06/2013   Hyperlipidemia associated with type 2 diabetes mellitus (HCC), on Zocor  04/04/2011   Hypertension associated with diabetes (HCC) 06/03/2008   Morbid obesity with BMI of 50.0-59.9, adult (HCC) 07/12/2006   NICM (nonischemic cardiomyopathy) (HCC) 07/12/2006   Migraine without status migrainosus, not intractable 07/12/2006    Patient's  Medications  New Prescriptions   No medications on file  Previous Medications   ACETAMINOPHEN  (TYLENOL ) 325 MG TABLET    Take 1-2 tablets (325-650 mg total) by mouth every 4 (four) hours as needed for mild pain (pain score 1-3).   ALPRAZOLAM  (XANAX ) 0.5 MG TABLET    Take 1 tablet (0.5 mg total) by mouth at bedtime as needed for anxiety (if anxiety unrelieved by hydroxyzine ).   ATORVASTATIN  (LIPITOR ) 80 MG TABLET    Take 1 tablet (80 mg total) by mouth daily.   BLOOD GLUCOSE MONITORING SUPPL DEVI    1 each by Does not apply route in the morning, at noon, and at bedtime. May substitute to any manufacturer covered by patient's insurance.   CARVEDILOL  (COREG ) 25 MG TABLET    Take 25 mg by mouth 2 (two) times daily.   COLLAGENASE  (SANTYL ) 250 UNIT/GM OINTMENT    Apply 1 Application topically daily.   DAPAGLIFLOZIN  PROPANEDIOL (FARXIGA ) 10 MG TABS TABLET    Take 1 tablet (10 mg total) by mouth daily.   FLUTICASONE  (FLONASE ) 50 MCG/ACT NASAL SPRAY    Place 2 sprays into both nostrils daily.   FUROSEMIDE  (LASIX ) 40 MG TABLET    Take 1 tablet (40 mg total) by mouth daily.   GLUCOSE BLOOD (BLOOD GLUCOSE TEST STRIPS) STRP    1 each by In Vitro route in the morning, at noon, and at bedtime. May substitute to any manufacturer covered by patient's insurance.   KETOCONAZOLE  (NIZORAL ) 2 % CREAM    Apply 1 Application topically daily. Apply 1gm to skin on plantar aspect of affected foot, and massage in well.  Apply once daily.   LINEZOLID  (ZYVOX ) 600 MG TABLET    Take 1 tablet (600  mg total) by mouth 2 (two) times daily.   LOSARTAN  (COZAAR ) 25 MG TABLET    Take 0.5 tablets (12.5 mg total) by mouth daily.   MAGNESIUM  OXIDE (MAG-OX) 400 MG TABLET    Take 1 tablet (400 mg total) by mouth at bedtime.   METFORMIN  (GLUCOPHAGE -XR) 500 MG 24 HR TABLET    Take 1 tablet (500 mg total) by mouth 2 (two) times daily with a meal.   METOPROLOL  SUCCINATE (TOPROL -XL) 50 MG 24 HR TABLET    Take 1 tablet (50 mg total) by mouth  daily. Take with or immediately following a meal.   MULTIPLE VITAMIN (MULTIVITAMIN WITH MINERALS) TABS TABLET    Take 1 tablet by mouth daily.   NON FORMULARY    Pt uses cpap nightly   NUTRITION SUPPLEMENT, JUVEN, (JUVEN) PACK    Take 1 packet by mouth 2 (two) times daily between meals.   OXYCODONE  HCL 10 MG TABS    Take 1 tablet (10 mg total) by mouth 3 (three) times daily as needed.   POLYETHYLENE GLYCOL POWDER (GLYCOLAX /MIRALAX ) 17 GM/SCOOP POWDER    Take 1 capful (17 g) in water 2 (two) times daily.   POTASSIUM CHLORIDE  SA (KLOR-CON  M) 20 MEQ TABLET    Take 1 tablet (20 mEq total) by mouth daily.   SENNA-DOCUSATE (SENOKOT-S) 8.6-50 MG TABLET    Take 2 tablets by mouth at bedtime.   SPIRONOLACTONE  (ALDACTONE ) 25 MG TABLET    Take 0.5 tablets (12.5 mg total) by mouth daily.   TOPIRAMATE  (TOPAMAX ) 25 MG TABLET    Take 1 tablet (25 mg total) by mouth at bedtime.  Modified Medications   No medications on file  Discontinued Medications   No medications on file    Subjective: 58 yo female hx advance heart failure, dm2, recent prolonged admission 12/05/22-12/30/22 for vf cardiac arrest and left knee IO catheter that complicated with chronic wound here for f/u wound infection   02/28/23 id clinic visit I reviewed note from her last visit with dr Dea Pcn allergy  -- urgent referral for allergy  immunology in setting of actinomyces species on wound cx. Saw dr Orlan 02/27/23 and planned pcn testing/desensitization soon Ortho/plastic seeing patient  01/27/23 seen by pcp and there is a ?swab wound cx that grew pseudomonas (cipro  mic 0.5 sensitive; levo 2 resistant) 12/12 I&D of the wound by plastic -- myriad tissue matrix placed over exposed tendon. Operative cx e faecalis, actinomyces species and actinomyces europea Patient had 2 round of ciprofloxacin  12/5 and 12/13; no side effect.  She had another I&D by plastic surgery 12/30.   Patient currently taking linezolid  only and had finished cipro   I  have no imaging for patient. Given the IO catheter use I worry there could be OM   I also reviewed operative note from 12/12 and 02/24/23 of dr Leonce [place] of plastic surgery 12/12: Didn't appear the debridement needs to go to the bone. Tendon exposed as mentioned above 12/30: Tissue healing but patella tendon remains exposed. I&D done and more tissue matrix placed over the tendon. No operative culture sent  She reports sharp/shooting pain in the area. No f/c. No n/v/d. No blurry vision. No numbness/tingling Will see plastic again in a few days  Doesn't have desensitization until 1/27  Also wears life vest now waiting for icd placement which await clearance of Infection   Review of Systems: all systems reviewed and negative  Past Medical History:  Diagnosis Date   ADD (attention deficit disorder)  Anxiety    Arthritis    both knees right worse than left   Carpal tunnel syndrome of right wrist    Depression    Essential hypertension, benign    Gallstones    History of smoking 06/22/2016   Hyperlipidemia associated with type 2 diabetes mellitus (HCC), on Zocor  04/04/2011   Hypertension associated with diabetes (HCC) 06/03/2008   Migraines    Morbid obesity (HCC)    Morbid obesity with BMI of 50.0-59.9, adult (HCC) 07/12/2006   NICM (nonischemic cardiomyopathy) (HCC) 07/12/2006   01/16/18 ECHO:    - Procedure narrative: Transthoracic echocardiography. Image   quality was suboptimal. The study was technically difficult.   Intravenous contrast (Definity ) was administered. - Left ventricle: The cavity size was moderately dilated. Wall   thickness was increased in a pattern of mild LVH. Systolic   function was moderately to severely reduced. The estimated   ejection fraction w   Normal coronary arteries 04/17/2016   OSA on CPAP 10/28/2014   Spinal stenosis    back pain   Spondylosis without myelopathy or radiculopathy, lumbar region 12/04/2017   Type 2 diabetes mellitus with  hyperglycemia [facility])    Past Surgical History:  Procedure Laterality Date   CHOLECYSTECTOMY     DILATATION & CURETTAGE/HYSTEROSCOPY WITH MYOSURE N/A 10/31/2017   Procedure: DILATATION & CURETTAGE/HYSTEROSCOPY WITH MYOSURE;  Surgeon: Jannis Kate Norris, MD;  Location: [facility];  Service: Gynecology;  Laterality: N/A;   HYSTEROSCOPY WITH D & C N/A 05/07/2022   Procedure: DILATATION AND CURETTAGE /HYSTEROSCOPY;  Surgeon: Jeralyn Crutch, MD;  Location: [facility] OR;  Service: Gynecology;  Laterality: N/A;   INCISION AND DRAINAGE OF WOUND Left 02/06/2023   Procedure: debridement of left leg wound, application of myriad;  Surgeon: Waddell Leonce NOVAK, MD;  Location[facility] [address] [facility];  Service: Plastics;  Laterality: Left;   INCISION AND DRAINAGE OF WOUND Left 02/24/2023   Procedure: application of tissue replacement matrix of left patellar tendon;  Surgeon: Waddell Leonce NOVAK, MD;  Location: [facility] OR;  Service: Plastics;  Laterality: Left;   IRRIGATION AND DEBRIDEMENT KNEE  02/24/2023   Procedure: IRRIGATION AND DEBRIDEMENT KNEE;  Surgeon: Waddell Leonce NOVAK, MD;  Location: [facility] OR;  Service: Plastics;;   life vest     RIGHT/LEFT HEART CATH AND CORONARY ANGIOGRAPHY N/A 04/11/2016   Procedure: Right/Left Heart Cath and Coronary Angiography;  Surgeon: Lonni JONETTA Cash, MD;  Location: [facility] INVASIVE CV LAB;  Service: Cardiovascular;  Laterality: N/A;   RIGHT/LEFT HEART CATH AND CORONARY ANGIOGRAPHY N/A 12/09/2022   Procedure: RIGHT/LEFT HEART CATH AND CORONARY ANGIOGRAPHY;  Surgeon: Ladona Heinz, MD;  Location: [facility] INVASIVE CV LAB;  Service: Cardiovascular;  Laterality: N/A;   sonogram for blood clots     no blockages     Social History   Tobacco Use   Smoking status: Former    Current packs/day: 0.00    Types: Cigarettes    Quit date: 02/25/1997    Years since quitting: 26.0   Smokeless tobacco: Never   Tobacco comments:    Married, lives with spouse (when he is not traveling) and son  Vaping Use   Vaping status:  Never Used  Substance Use Topics   Alcohol use: No   Drug use: No    Family History  Problem Relation Age of Onset   Depression Mother    Anxiety disorder Mother    Diabetes Mother    Hypertension Mother    ADD / ADHD Father    Diabetes Father  Hypertension Father    Colitis Father    Hypertension Other    Diabetes Other    Colitis Other    Alcohol abuse Other     Allergies  Allergen Reactions   Fluoxetine Other (See Comments)    More depressed   Cymbalta [Duloxetine Hcl] Other (See Comments)    depressed   Pork-Derived Products    Penicillins Rash    Has patient had a PCN reaction causing immediate rash, facial/tongue/throat swelling, SOB or lightheadedness with hypotension: YES Has patient had a PCN reaction causing severe rash involving mucus membranes or skin necrosis: NO Has patient had a PCN reaction that required hospitalization: YES Has patient had a PCN reaction occurring within the last 10 years: NO If all of the above answers are NO, then may proceed with Cephalosporin use.     Health Maintenance  Topic Date Due   COVID-19 Vaccine (4 - 2024-25 season) 10/27/2022   Zoster Vaccines- Shingrix  (1 of 2) 04/18/2023 (Originally [birth year]1984)   FOOT EXAM  03/07/2023   HEMOGLOBIN A1C  06/07/2023   MAMMOGRAM  07/09/2023   OPHTHALMOLOGY EXAM  08/09/2023   Colonoscopy  02/11/2026   Cervical Cancer Screening (HPV/Pap Cotest)  08/02/2026   DTaP/Tdap/Td (6 - Td or Tdap) 11/08/2027   INFLUENZA VACCINE  Completed   Hepatitis C Screening  Completed   HIV Screening  Completed   HPV VACCINES  Aged Out    Objective: LMP  (LMP Unknown)   Physical Exam Constitutional:      Appearance: Normal appearance. Morbidly obese  HENT:     Head: Normocephalic and atraumatic.      Mouth: Mucous membranes are moist.  Eyes:    Conjunctiva/sclera: Conjunctivae normal.     Pupils: Pupils are equal, round, and b/l symmetrical   Cardiovascular:     Rate and Rhythm: Normal rate  and regular rhythm.     Heart sounds: s1s2. She has a life vest. Skin iritation at the left lateral breast due to life vest, no cellulitis or signs of infection  Pulmonary:     Effort: Pulmonary effort is normal.     Breath sounds: Normal breath sounds.   Abdominal:     General: Non distended     Palpations: soft.   Musculoskeletal:        General: sitting in the wheelchair  Skin: 02/28/23 picture from 12/30   Lab Results Lab Results  Component Value Date   WBC 13.3 (H) 02/13/2023   HGB 14.2 02/13/2023   HCT 45.2 (H) 02/13/2023   MCV 86.9 02/13/2023   PLT 373 02/13/2023    Lab Results  Component Value Date   CREATININE 0.88 02/13/2023   BUN 15 02/13/2023   NA 142 02/13/2023   K 3.8 02/13/2023   CL 102 02/13/2023   CO2 28 02/13/2023    Lab Results  Component Value Date   ALT 26 12/18/2022   AST 23 12/18/2022   ALKPHOS 42 12/18/2022   BILITOT 0.9 12/18/2022    Lab Results  Component Value Date   CHOL 194 08/13/2022   HDL 48.90 08/13/2022   LDLCALC 117 (H) 08/13/2022   LDLDIRECT 143.8 04/03/2011   TRIG 208 (H) 12/06/2022   CHOLHDL 4 08/13/2022   No results found for: LABRPR, RPRTITER No results found for: HIV1RNAQUANT, HIV1RNAVL, CD4TABS    Imaging: Reviewed  11/2022 tte  1. Left ventricular ejection fraction, by estimation, is 30 to 35%. The  left ventricle has moderately decreased function. The left ventricle  demonstrates global hypokinesis. There is mild left ventricular  hypertrophy. Indeterminate diastolic filling due   to E-A fusion.   2. Right ventricular systolic function is normal. The right ventricular  size is normal.   3. The mitral valve is normal in structure. Trivial mitral valve  regurgitation.   4. The aortic valve is tricuspid. Aortic valve regurgitation is not  visualized.   5. The inferior vena cava IVC not well visualized.   Comparison(s): No significant change from prior study.       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  Culture: -01/27/23 is a clinic superficial swab --> pseudomonas (S cipro ; R levo) -- I do not know the significance of this superficial cx (open wound is often colonized and pseudomonas usually do not play a pathogenic role) -12/12 I&D culture E faecalis, Actinomyces spp, Actinomyces europaea.   Tx course: 12/12 I&D and matrix application to tendon 12/30 another matrix application -- wound looks healthy otherwise Abx: Finished 2 courses of cipro  7 day at a time by 12/17 Zyvox  started 12/19   -continue zyvox  for now in setting of actinomyces/e faecalis (the latter might not be pathogenic either). Doxy possible if needs bridging but want to start amoxicillin  -surgery the heavy lifting has been done but would like to treat with amoxicillin  for at least 3 months in setting actinomyces found -labs today -f/u 2 weeks -chart sent to dr Luke Needle requesting sooner pcn testing/desensitization if needed   # Penicillin allergy   Saw allergy  and planned desensitization/testing in a few weeks  -would like to see if this can be done sooner say in 1-2 weeks and I'll send message to dr Luke Needle today (so ICD can be considered and less toxic antibiotics can be used)     # Chronic HFrEF/NICM, VF Cardiac arrest  Fu with cardiology  Plan for ICD once left leg wound heals  -from id standpoint today, agree healed wound would be least risky given portal of entry. But if more urgent icd placement needed, I am ok with placing it once she is on amoxicillin  (this would make it easier for hospital admission to transition to iv penicillin, and also by the time we transition her to penicillin, will know for sure the insignificance of pseudomonas found on superficial tissue culture       Constance ONEIDA Passer, MD [facility] 02/28/2023, 11:34 AM

## 2023-02-28 NOTE — Patient Instructions (Signed)
 Continue zyvox antibiotics for now   We want to switch you to amoxicillin but this will need clearance from your allergy doctor   See [place] in 2 weeks   Hope to get the icd in soon once you are on amoxicillin

## 2023-02-28 NOTE — Progress Notes (Unsigned)
 cbc

## 2023-03-01 LAB — CBC WITH DIFFERENTIAL/PLATELET
Absolute Lymphocytes: 1762 {cells}/uL (ref 850–3900)
Absolute Monocytes: 463 {cells}/uL (ref 200–950)
Basophils Absolute: 107 {cells}/uL (ref 0–200)
Basophils Relative: 1.2 %
Eosinophils Absolute: 231 {cells}/uL (ref 15–500)
Eosinophils Relative: 2.6 %
HCT: 44.5 % (ref 35.0–45.0)
Hemoglobin: 14.1 g/dL (ref 11.7–15.5)
MCH: 27 pg (ref 27.0–33.0)
MCHC: 31.7 g/dL — ABNORMAL LOW (ref 32.0–36.0)
MCV: 85.2 fL (ref 80.0–100.0)
MPV: 9.7 fL (ref 7.5–12.5)
Monocytes Relative: 5.2 %
Neutro Abs: 6337 {cells}/uL (ref 1500–7800)
Neutrophils Relative %: 71.2 %
Platelets: 284 10*3/uL (ref 140–400)
RBC: 5.22 10*6/uL — ABNORMAL HIGH (ref 3.80–5.10)
RDW: 13.6 % (ref 11.0–15.0)
Total Lymphocyte: 19.8 %
WBC: 8.9 10*3/uL (ref 3.8–10.8)

## 2023-03-01 LAB — SEDIMENTATION RATE: Sed Rate: 53 mm/h — ABNORMAL HIGH (ref 0–30)

## 2023-03-01 LAB — COMPREHENSIVE METABOLIC PANEL
AG Ratio: 1.3 (calc) (ref 1.0–2.5)
ALT: 30 U/L — ABNORMAL HIGH (ref 6–29)
AST: 26 U/L (ref 10–35)
Albumin: 4 g/dL (ref 3.6–5.1)
Alkaline phosphatase (APISO): 48 U/L (ref 37–153)
BUN: 21 mg/dL (ref 7–25)
CO2: 24 mmol/L (ref 20–32)
Calcium: 9.9 mg/dL (ref 8.6–10.4)
Chloride: 100 mmol/L (ref 98–110)
Creat: 1.01 mg/dL (ref 0.50–1.03)
Globulin: 3 g/dL (ref 1.9–3.7)
Glucose, Bld: 173 mg/dL — ABNORMAL HIGH (ref 65–99)
Potassium: 4.1 mmol/L (ref 3.5–5.3)
Sodium: 140 mmol/L (ref 135–146)
Total Bilirubin: 1.1 mg/dL (ref 0.2–1.2)
Total Protein: 7 g/dL (ref 6.1–8.1)

## 2023-03-01 LAB — C-REACTIVE PROTEIN: CRP: 19.3 mg/L — ABNORMAL HIGH (ref <8.0)

## 2023-03-02 DIAGNOSIS — E785 Hyperlipidemia, unspecified: Secondary | ICD-10-CM | POA: Diagnosis not present

## 2023-03-02 DIAGNOSIS — E1159 Type 2 diabetes mellitus with other circulatory complications: Secondary | ICD-10-CM | POA: Diagnosis not present

## 2023-03-02 DIAGNOSIS — Z7985 Long-term (current) use of injectable non-insulin antidiabetic drugs: Secondary | ICD-10-CM | POA: Diagnosis not present

## 2023-03-02 DIAGNOSIS — I5023 Acute on chronic systolic (congestive) heart failure: Secondary | ICD-10-CM | POA: Diagnosis not present

## 2023-03-02 DIAGNOSIS — E1165 Type 2 diabetes mellitus with hyperglycemia: Secondary | ICD-10-CM | POA: Diagnosis not present

## 2023-03-02 DIAGNOSIS — N1831 Chronic kidney disease, stage 3a: Secondary | ICD-10-CM | POA: Diagnosis not present

## 2023-03-02 DIAGNOSIS — I96 Gangrene, not elsewhere classified: Secondary | ICD-10-CM | POA: Diagnosis not present

## 2023-03-02 DIAGNOSIS — E1122 Type 2 diabetes mellitus with diabetic chronic kidney disease: Secondary | ICD-10-CM | POA: Diagnosis not present

## 2023-03-02 DIAGNOSIS — F418 Other specified anxiety disorders: Secondary | ICD-10-CM | POA: Diagnosis not present

## 2023-03-02 DIAGNOSIS — I5032 Chronic diastolic (congestive) heart failure: Secondary | ICD-10-CM | POA: Diagnosis not present

## 2023-03-02 DIAGNOSIS — E1169 Type 2 diabetes mellitus with other specified complication: Secondary | ICD-10-CM | POA: Diagnosis not present

## 2023-03-02 DIAGNOSIS — I152 Hypertension secondary to endocrine disorders: Secondary | ICD-10-CM | POA: Diagnosis not present

## 2023-03-02 DIAGNOSIS — Z7984 Long term (current) use of oral hypoglycemic drugs: Secondary | ICD-10-CM | POA: Diagnosis not present

## 2023-03-02 DIAGNOSIS — T8189XA Other complications of procedures, not elsewhere classified, initial encounter: Secondary | ICD-10-CM | POA: Diagnosis not present

## 2023-03-02 DIAGNOSIS — Z8674 Personal history of sudden cardiac arrest: Secondary | ICD-10-CM | POA: Diagnosis not present

## 2023-03-02 DIAGNOSIS — Z6841 Body Mass Index (BMI) 40.0 and over, adult: Secondary | ICD-10-CM | POA: Diagnosis not present

## 2023-03-02 DIAGNOSIS — I080 Rheumatic disorders of both mitral and aortic valves: Secondary | ICD-10-CM | POA: Diagnosis not present

## 2023-03-02 DIAGNOSIS — M48061 Spinal stenosis, lumbar region without neurogenic claudication: Secondary | ICD-10-CM | POA: Diagnosis not present

## 2023-03-02 DIAGNOSIS — I959 Hypotension, unspecified: Secondary | ICD-10-CM | POA: Diagnosis not present

## 2023-03-02 DIAGNOSIS — T8089XA Other complications following infusion, transfusion and therapeutic injection, initial encounter: Secondary | ICD-10-CM | POA: Diagnosis not present

## 2023-03-02 DIAGNOSIS — Z87891 Personal history of nicotine dependence: Secondary | ICD-10-CM | POA: Diagnosis not present

## 2023-03-03 ENCOUNTER — Encounter: Payer: Self-pay | Admitting: Student

## 2023-03-03 ENCOUNTER — Ambulatory Visit (INDEPENDENT_AMBULATORY_CARE_PROVIDER_SITE_OTHER): Payer: Medicare Other | Admitting: Student

## 2023-03-03 VITALS — BP 123/93 | HR 95 | Temp 98.5°F | Wt 262.2 lb

## 2023-03-03 DIAGNOSIS — S81802D Unspecified open wound, left lower leg, subsequent encounter: Secondary | ICD-10-CM | POA: Diagnosis not present

## 2023-03-03 DIAGNOSIS — L89301 Pressure ulcer of unspecified buttock, stage 1: Secondary | ICD-10-CM

## 2023-03-03 NOTE — Progress Notes (Signed)
 Thanks Trung.

## 2023-03-03 NOTE — Progress Notes (Addendum)
 Referring Provider Joshua Debby CROME, MD [address] [place],  [place] [address]   CC:  Chief Complaint  Patient presents with   Post-op Follow-up      [name] is an 58 y.o. female.  HPI: Patient is a 59 year old female with history of a large wound to her left lower extremity.  She underwent debridement of the left lower extremity wound with placement of myriad tissue matrix with Dr. Waddell on 02/06/2023 initially.  She then underwent another wound debridement and placement of tissue replacement matrix with Dr. Waddell on 02/24/2023.  She presents to the clinic today for postoperative follow-up.  Today, patient presents with her husband at bedside.  Patient reports she has some pain from a suture pulling at the top of her wound.  She otherwise denies any specific complaints from her procedure last week.  She does report though that she has been feeling nauseous on and off and has not had much of an appetite.  She denies any fevers.  She also reports that her right knee hurts from having to compensate given the wound to her left knee.  She states she is seeing sports medicine about this tomorrow.  She also states that she feels like she has a sore on her buttocks.  Patient became tearful and states that this has been a lot for her emotionally.  Review of Systems General: Denies any fevers or chills  Physical Exam  Vitals:   03/03/23 1303  BP: (!) 123/93  Pulse: 95  Temp: 98.5 F (36.9 C)  SpO2: 96%  Patient is afebrile, vitals are stable  General:  No acute distress,  Alert and oriented, Non-Toxic, Normal speech and affect Left lower extremity: Sorbact noted over the entirety of the left lower extremity wound.  Myriad to medial extremity and over the tendon appears to be a little bit dark, could be dried blood versus some desiccation.  There does appear to be healthy appearing granulation tissue surrounding the tendon.  There are no signs of infection on exam. Buttocks: There  does appear to be some redness to the buttocks centrally  Assessment/Plan  Wound of left lower extremity, subsequent encounter   Discussed with patient and her husband that they should continue to apply K-Y jelly, Vashe soaked gauze, ABD pads, Kerlix and Ace wrap daily to her left lower extremity wound.  We will plan for the patient to come back to next week to have Sorbact removed.  Buttocks was cleaned with Vashe and Mepilex border dressing applied.  Will send orders to home health care to clean area with Vashe and apply Mepilex daily.  I will put in a referral to wound care also given it appears to be the beginning of a pressure injury.  I did discuss with the patient the importance of frequently moving to help prevent direct pressure on her buttocks/sacrum.  Also recommended that if she is lying down in bed or on the couch, to put a pillow under 1 buttocks and switch it every hour or so to the other 1 to help keep pressure directly off the same area.  Patient expressed understanding.  Recommend patient follow-up with her primary care provider in regards to her nausea.  Patient expressed understanding.  Recommended that patient start physical therapy through home health as this should help with her mobility.  Verbal orders placed several weeks ago.  Recommended that patient keep her appointment with her psych provider.  Also will reach out to PCP in  regards to helping with a pain management referral as referral sent before was denied.  I instructed the patient to call in the meantime if she has any questions or concerns about anything.  Pictures were obtained of the patient and placed in the chart with the patient's or guardian's permission.   [name] 03/03/2023, 2:26 PM

## 2023-03-04 ENCOUNTER — Ambulatory Visit (INDEPENDENT_AMBULATORY_CARE_PROVIDER_SITE_OTHER): Payer: Medicare Other

## 2023-03-04 ENCOUNTER — Ambulatory Visit: Payer: Medicare Other | Admitting: Family Medicine

## 2023-03-04 ENCOUNTER — Telehealth: Payer: Self-pay

## 2023-03-04 ENCOUNTER — Ambulatory Visit: Payer: Self-pay

## 2023-03-04 VITALS — BP 120/78 | HR 98 | Ht 64.0 in

## 2023-03-04 DIAGNOSIS — M25512 Pain in left shoulder: Secondary | ICD-10-CM | POA: Diagnosis not present

## 2023-03-04 DIAGNOSIS — M19012 Primary osteoarthritis, left shoulder: Secondary | ICD-10-CM | POA: Diagnosis not present

## 2023-03-04 DIAGNOSIS — F4323 Adjustment disorder with mixed anxiety and depressed mood: Secondary | ICD-10-CM | POA: Diagnosis not present

## 2023-03-04 DIAGNOSIS — T8089XA Other complications following infusion, transfusion and therapeutic injection, initial encounter: Secondary | ICD-10-CM | POA: Diagnosis not present

## 2023-03-04 DIAGNOSIS — M25561 Pain in right knee: Secondary | ICD-10-CM | POA: Diagnosis not present

## 2023-03-04 DIAGNOSIS — G8929 Other chronic pain: Secondary | ICD-10-CM

## 2023-03-04 DIAGNOSIS — M5416 Radiculopathy, lumbar region: Secondary | ICD-10-CM

## 2023-03-04 DIAGNOSIS — M1711 Unilateral primary osteoarthritis, right knee: Secondary | ICD-10-CM | POA: Diagnosis not present

## 2023-03-04 DIAGNOSIS — T8189XA Other complications of procedures, not elsewhere classified, initial encounter: Secondary | ICD-10-CM | POA: Diagnosis not present

## 2023-03-04 DIAGNOSIS — I5032 Chronic diastolic (congestive) heart failure: Secondary | ICD-10-CM | POA: Diagnosis not present

## 2023-03-04 DIAGNOSIS — I152 Hypertension secondary to endocrine disorders: Secondary | ICD-10-CM | POA: Diagnosis not present

## 2023-03-04 DIAGNOSIS — I96 Gangrene, not elsewhere classified: Secondary | ICD-10-CM | POA: Diagnosis not present

## 2023-03-04 DIAGNOSIS — I5023 Acute on chronic systolic (congestive) heart failure: Secondary | ICD-10-CM | POA: Diagnosis not present

## 2023-03-04 NOTE — Progress Notes (Signed)
 [name], PhD, LAT, ATC acting as a scribe for [name], MD.  [name] is a 58 y.o. female who presents to [facility] today for exacerbation of her right knee pain.  Patient was last seen by Dr. Lloyd for her right knee on 03/19/2022 and was given a right knee steroid injection.  Today, patient reports R knee pain returned 2-3 months ago. Oct 10th she had a cardiac arrest and then experienced a would from where the IV was placed in her L leg. She now also c/o bilat shoulder pain, L>R, due to using her arms more. She does not feel like the oxycodone  is helping.   She notes significant difficulty coping with all of the aches and pains and life change following her hospitalization.  Most dominantly has a wound in her left leg from an interosseous catheter as part of her resuscitation.  This is required skin graft and wound closure and is healing very slowly.  She notes great difficulty with mental coping.  Dx testing: 03/19/22 R knee XR  Pertinent review of systems: No fevers or chills  Relevant historical information: History of depression   Exam:  BP 120/78   Pulse 98   Ht 5' 4 (1.626 m)   LMP  (LMP Unknown)   SpO2 96%   BMI 45.01 kg/m  General: Well Developed, well nourished, and in no acute distress.   MSK: Right knee mild effusion normal motion with crepitation tender to palpation.  Left shoulder normal-appearing nontender to palpation decreased range of motion pain with abduction.  L-spine nontender to palpation decreased lumbar motion.    Lab and Radiology Results  Procedure: Real-time Ultrasound Guided Injection of left shoulder subacromial bursa Device: Philips Affiniti 50G/GE Logiq Images permanently stored and available for review in PACS Verbal informed consent obtained.  Discussed risks and benefits of procedure. Warned about infection, bleeding, hyperglycemia damage to structures among others. Patient expresses  understanding and agreement Time-out conducted.   Noted no overlying erythema, induration, or other signs of local infection.   Skin prepped in a sterile fashion.   Local anesthesia: Topical Ethyl chloride.   With sterile technique and under real time ultrasound guidance: 40 mg of Kenalog and 2 mL of Marcaine  injected into subacromial bursa. Fluid seen entering the bursa.   Completed without difficulty   Pain immediately resolved suggesting accurate placement of the medication.   Advised to call if fevers/chills, erythema, induration, drainage, or persistent bleeding.   Images permanently stored and available for review in the ultrasound unit.  Impression: Technically successful ultrasound guided injection.    Procedure: Real-time Ultrasound Guided Injection of right knee joint superior lateral patella space Device: Philips Affiniti 50G/GE Logiq Images permanently stored and available for review in PACS Verbal informed consent obtained.  Discussed risks and benefits of procedure. Warned about infection, bleeding, hyperglycemia damage to structures among others. Patient expresses understanding and agreement Time-out conducted.   Noted no overlying erythema, induration, or other signs of local infection.   Skin prepped in a sterile fashion.   Local anesthesia: Topical Ethyl chloride.   With sterile technique and under real time ultrasound guidance: 40 mg of Kenalog and 2 mL of Marcaine  injected into knee joint. Fluid seen entering the joint capsule.   Completed without difficulty   Pain immediately resolved suggesting accurate placement of the medication.   Advised to call if fevers/chills, erythema, induration, drainage, or persistent bleeding.   Images permanently stored and available  for review in the ultrasound unit.  Impression: Technically successful ultrasound guided injection.   X-ray images right knee and left shoulder obtained today personally and independently  interpreted.  Right knee: Moderate medial compartment DJD.  Mild patellofemoral DJD.  No acute fractures are visible.  Left shoulder: No acute fractures no severe glenohumeral arthritis.  External defibrillator device in place on x-ray.  Await formal radiology review    Assessment and Plan: 58 y.o. female with worsening chronic low back pain and chronic right knee pain and chronic left shoulder pain following a near fatal cardiac arrhythmia occurring about 2 months ago. During the resuscitation she had a left knee interosseous catheter placed which (I think) caused some skin necrosis which has been difficult to heal.  Her main issues with me today are the back pain right knee pain and left shoulder pain.  Plan to inject the right knee and the left shoulder and refer to already engaged home health for home physical therapy for the back pain and the shoulder pain.  Additionally recommend counseling for adjustment disorder and worsening depression.  Recheck in 1 month.   PDMP not reviewed this encounter. Orders Placed This Encounter  Procedures   US  LIMITED JOINT SPACE STRUCTURES LOW RIGHT(NO LINKED CHARGES)    Reason for Exam (SYMPTOM  OR DIAGNOSIS REQUIRED):   right knee pain    Preferred imaging location?:   [facility] Knee AP/LAT W/[place] Right    Standing Status:   Future    Number of Occurrences:   1    Expiration Date:   04/04/2023    Reason for Exam (SYMPTOM  OR DIAGNOSIS REQUIRED):   right knee pain    Preferred imaging location?:   [facility] [place]    Is patient pregnant?:   No   DG Shoulder Left    Standing Status:   Future    Number of Occurrences:   1    Expiration Date:   04/04/2023    Reason for Exam (SYMPTOM  OR DIAGNOSIS REQUIRED):   left shoulder pain    Preferred imaging location?:   [facility] [place]    Is patient pregnant?:   No   Ambulatory referral to Home Health    Referral Priority:   Routine    Referral Type:   Home  Health Care    Referral Reason:   Specialty Services Required    Requested Specialty:   Home Health Services    Number of Visits Requested:   1   No orders of the defined types were placed in this encounter.    Discussed warning signs or symptoms. Please see discharge instructions. Patient expresses understanding.   The above documentation has been reviewed and is accurate and complete [name], M.D.

## 2023-03-04 NOTE — Patient Instructions (Addendum)
 Thank you for coming in today.   You received an injection today. Seek immediate medical attention if the joint becomes red, extremely painful, or is oozing fluid.   The Pain Management Workbook: Powerful CBT and Mindfulness Skills to Take Control of Pain and Reclaim Your Life https://www.zoffness.com/   Please get an Xray today before you leave   I've referred you to Home Health Physical Therapy.  Let us  know if you don't hear from them in one week.   I think you have adjustment disorder. I recommend looking for therapists in [place] that treat this.  Look at psychiology today website.  Check back in 1 month

## 2023-03-04 NOTE — Telephone Encounter (Signed)
 Faxed updated wound orders to [facility] to request patients buttocks be cleaned with Vashe daily, vaseline applied to red areas and where a wound is starting and then apply sacral mepilex foam dressing (6.3 x 7.9). Received fax success confirmation.

## 2023-03-04 NOTE — Telephone Encounter (Signed)
 Called Liji @ [facility] to request PT start. No answer, left voicemail. During pt's visit yesterday she c/o BL arm pain and R knee pain from compensating for the L leg wound. Adv pt's deconditioning will be worse if PT isn't started soon.

## 2023-03-05 NOTE — Telephone Encounter (Signed)
 Can I give the orders ?

## 2023-03-05 NOTE — Telephone Encounter (Signed)
 COPIED FROM CRM:  [facility] IS REQUESTING PHYSICAL THERAPY 2 DAYS PER WEEK FOR 3 WEEKS AND THEN 1 DAY A WEEK FOR 3 WEEKS TO FOCUS ON GATE BALANCE AND STRENGTHENING. PLEASE CONTACT LIJI @ [phone]

## 2023-03-05 NOTE — Progress Notes (Signed)
 Left shoulder x-ray with a little bit of arthritis at the small joint at the top of the shoulder.  Otherwise it looks okay.

## 2023-03-06 ENCOUNTER — Other Ambulatory Visit: Payer: Self-pay | Admitting: Allergy

## 2023-03-06 ENCOUNTER — Ambulatory Visit: Payer: Medicare Other

## 2023-03-06 DIAGNOSIS — T8189XA Other complications of procedures, not elsewhere classified, initial encounter: Secondary | ICD-10-CM | POA: Diagnosis not present

## 2023-03-06 DIAGNOSIS — I5023 Acute on chronic systolic (congestive) heart failure: Secondary | ICD-10-CM | POA: Diagnosis not present

## 2023-03-06 DIAGNOSIS — I5032 Chronic diastolic (congestive) heart failure: Secondary | ICD-10-CM | POA: Diagnosis not present

## 2023-03-06 DIAGNOSIS — T8089XA Other complications following infusion, transfusion and therapeutic injection, initial encounter: Secondary | ICD-10-CM | POA: Diagnosis not present

## 2023-03-06 DIAGNOSIS — I96 Gangrene, not elsewhere classified: Secondary | ICD-10-CM | POA: Diagnosis not present

## 2023-03-06 DIAGNOSIS — I152 Hypertension secondary to endocrine disorders: Secondary | ICD-10-CM | POA: Diagnosis not present

## 2023-03-06 MED ORDER — AMOXICILLIN 250 MG/5ML PO SUSR: ORAL | 0 refills | Status: DC

## 2023-03-06 MED ORDER — AMOXICILLIN 500 MG PO CAPS: ORAL_CAPSULE | ORAL | 0 refills | Status: DC

## 2023-03-06 NOTE — Progress Notes (Signed)
 Left detailed message for pt on voicemail box

## 2023-03-07 NOTE — Telephone Encounter (Signed)
 Copied from CRM [id number]. Topic: Clinical - Home Health Verbal Orders >> Mar 07, 2023  9:30 AM Leotis ORN wrote: Caller/Agency: [facility]  Callback Number: [phone] Service Requested: Physical Therapy Frequency: 2 DAYS PER WEEK FOR 3 WEEKS AND THEN 1 DAY A WEEK FOR 3 WEEKS TO FOCUS ON GATE BALANCE AND STRENGTHENING Any new concerns about the patient? No   Stated it is okay to leave secure VM

## 2023-03-09 NOTE — Progress Notes (Cosign Needed Addendum)
Assessment/Plan:   [name] is a very pleasant 58 y.o. year old RH female with a history of hypertension, hyperlipidemia, NICM, CHF, status post cardiac arrest /VF with 40 minutes of CPR, OSA unable to tolerate CPAP, CKD, DM2, ADD, anxiety, depression, anemia, chronic LL ED with status post debridement in December 2024, chronic pain, seen today for evaluation of memory loss. MoCA today is 20/30.  There is a chance that she could have that performed better but she admits that her ADD may be playing a role in her performance.  Workup is in progress.  She is dealing with significant amount of situational depression overlapping her actual history of depression.  She is in the process of finding a new psychiatrist, has not been seen by one in a long time.  Agree that she should establish care with another mental health provider which could benefit her memory issues as well.  Patient and husband wish to wait for MRI given that she just was released from the hospital and she feels very weak and emotionally exhausted.   Memory Impairment of unclear etiology  MRI brain without contrast to assess for underlying structural abnormality and assess vascular load  Once the MRI of the brain results are available, we will proceed with further recommendations. Replenish B12 (357) Continue to control mood as per psychiatry, recommend psychotherapy as well for MDD, ADD Referral to pain clinic for pain management Recommend good control of cardiovascular risk factors Follow-up in 4 months   Subjective:   The patient is accompanied by her husband who supplements the history.   How long did patient have memory difficulties?  " Absolutely , since I know I have ADD!, maybe worse since the last hospitalization".  Reports some difficulty remembering new information, conversations and names.  Long-term memory is good.  She is not very active "I seat all the time, I am on my chair all day ".  repeats oneself?   Endorsed Disoriented when walking into a room?  Patient denies  Leaving objects in unusual places? Denies.  Wandering behavior?  Denies.  Any personality changes?  Denies.   Any history of depression?:  Denies. " A lot more depressed"."Very angry , it is too much!".  "I feel very overwhelmed".  "Not necessarily suicidal but my life su*&s, I am losing care". She has a therapist  who has been cancelled her a few times.  She is looking for new BH.  Hallucinations or paranoia?  Denies   Seizures?  Denies    Any sleep changes?   Does not sleep well, Reports vivid dreams"always did", denies REM behavior or sleepwalking   Sleep apnea? Endorsed, does not use a CPAP. "I know I should but I can't put a mast on my face now" Any hygiene concerns?  Needs assistance, "my husband has to help me for everything ".    Independent of bathing and dressing?  Endorsed  Does the patient needs help with medications? Husband is in charge   Who is in charge of the finances? Husband is in charge     Any changes in appetite?  Since hospitalization, smells bother me. I have a hard time eating, lost 50 lbs since October.     Patient have trouble swallowing? Denies.   Does the patient cook? No.  Any kitchen accidents such as leaving the stove on? Denies.   Any history of headaches? Remote, during fertile years. Not frequently.  Chronic pain ?Yes, takes oxycodone, and other pain meds.  Ambulates with difficulty?  Uses a walker and a cane at home, needs assistance to prevent falls. She also has a scooter when going to the stores Recent falls or head injuries? No head injuries.  Vision changes? Denies.   Unilateral weakness, numbness or tingling? Denies.   Any tremors?   Denies.   Any anosmia?  "Smells make me nauseous" Any incontinence of urine? Over the last few years.  Any bowel dysfunction? Constipation.  Patient lives with her husband.    History of heavy alcohol intake? Denies.   History of heavy tobacco use?  Denies.   Family history of dementia? Denies.  Does patient drive? Until October, cannot due to mobility issues  TSH 4.29, B12 357, B1 35  CT of the head on 12/05/2022 with questionable area of low density in the right frontal lobe questionable artifactual, gray-white differentiation preserved, no acute findings.  Frontal hyperostosis noted. Past Medical History:  Diagnosis Date   ADD (attention deficit disorder)    Anxiety    Arthritis    both knees right worse than left   Carpal tunnel syndrome of right wrist    Depression    Essential hypertension, benign    Gallstones    History of smoking 06/22/2016   Hyperlipidemia associated with type 2 diabetes mellitus (HCC), on Zocor 04/04/2011   Hypertension associated with diabetes (HCC) 06/03/2008   Migraines    Morbid obesity (HCC)    Morbid obesity with BMI of 50.0-59.9, adult (HCC) 07/12/2006   NICM (nonischemic cardiomyopathy) (HCC) 07/12/2006   01/16/18 ECHO:    - Procedure narrative: Transthoracic echocardiography. Image   quality was suboptimal. The study was technically difficult.   Intravenous contrast (Definity) was administered. - Left ventricle: The cavity size was moderately dilated. Wall   thickness was increased in a pattern of mild LVH. Systolic   function was moderately to severely reduced. The estimated   ejection fraction w   Normal coronary arteries 04/17/2016   OSA on CPAP 10/28/2014   Spinal stenosis    back pain   Spondylosis without myelopathy or radiculopathy, lumbar region 12/04/2017   Type 2 diabetes mellitus with hyperglycemia [facility])      Past Surgical History:  Procedure Laterality Date   CHOLECYSTECTOMY     DILATATION & CURETTAGE/HYSTEROSCOPY WITH MYOSURE N/A 10/31/2017   Procedure: DILATATION & CURETTAGE/HYSTEROSCOPY WITH MYOSURE;  Surgeon: Romualdo Bolk, MD;  Location: [facility] ORS;  Service: Gynecology;  Laterality: N/A;   HYSTEROSCOPY WITH D & C N/A 05/07/2022   Procedure: DILATATION AND CURETTAGE  /HYSTEROSCOPY;  Surgeon: [name], MD;  Location: [facility] OR;  Service: Gynecology;  Laterality: N/A;   INCISION AND DRAINAGE OF WOUND Left 02/06/2023   Procedure: debridement of left leg wound, application of myriad;  Surgeon: [name], MD;  Location: [facility] OR;  Service: Plastics;  Laterality: Left;   INCISION AND DRAINAGE OF WOUND Left 02/24/2023   Procedure: application of tissue replacement matrix of left patellar tendon;  Surgeon: [name], MD;  Location: [facility] OR;  Service: Plastics;  Laterality: Left;   IRRIGATION AND DEBRIDEMENT KNEE  02/24/2023   Procedure: IRRIGATION AND DEBRIDEMENT KNEE;  Surgeon: [name], MD;  Location: [facility] OR;  Service: Plastics;;   life vest     RIGHT/LEFT HEART CATH AND CORONARY ANGIOGRAPHY N/A 04/11/2016   Procedure: Right/Left Heart Cath and Coronary Angiography;  Surgeon: Kathleene Hazel, MD;  Location: [facility] INVASIVE CV LAB;  Service: Cardiovascular;  Laterality: N/A;   RIGHT/LEFT HEART  CATH AND CORONARY ANGIOGRAPHY N/A 12/09/2022   Procedure: RIGHT/LEFT HEART CATH AND CORONARY ANGIOGRAPHY;  Surgeon: Yates Decamp, MD;  Location: [facility] INVASIVE CV LAB;  Service: Cardiovascular;  Laterality: N/A;   sonogram for blood clots     no blockages     Allergies  Allergen Reactions   Fluoxetine Other (See Comments)    More depressed   Cymbalta [Duloxetine Hcl] Other (See Comments)    depressed   Pork-Derived Products     Current Outpatient Medications  Medication Instructions   acetaminophen (TYLENOL) 325-650 mg, Oral, Every 4 hours PRN   amoxicillin (AMOXIL) 750 mg, Oral, 3 times daily   atorvastatin (LIPITOR) 80 mg, Oral, Daily   Blood Glucose Monitoring Suppl DEVI 1 each, Does not apply, 3 times daily, May substitute to any manufacturer covered by patient's insurance.   carvedilol (COREG) 25 mg, 2 times daily   collagenase (SANTYL) 250 UNIT/GM ointment 1 Application, Topical, Daily   dapagliflozin propanediol (FARXIGA) 10 mg, Oral,  Daily   fluticasone (FLONASE) 50 MCG/ACT nasal spray 2 sprays, Each Nare, Daily   furosemide (LASIX) 40 mg, Oral, Daily   Glucose Blood (BLOOD GLUCOSE TEST STRIPS) STRP 1 each, In Vitro, 3 times daily, May substitute to any manufacturer covered by [employer].   hydrOXYzine (ATARAX) 25 mg, Oral, 3 times daily PRN   ketoconazole (NIZORAL) 2 % cream 1 Application, Topical, Daily, Apply 1gm to skin on plantar aspect of affected foot, and massage in well.  Apply once daily.   linezolid (ZYVOX) 600 mg, Oral, 2 times daily   LORazepam (ATIVAN) 0.5 mg, Oral, 2 times daily PRN   losartan (COZAAR) 12.5 mg, Oral, Daily   magnesium oxide (MAG-OX) 400 mg, Oral, Daily at bedtime   metFORMIN (GLUCOPHAGE-XR) 500 mg, Oral, 2 times daily with meals   metoprolol succinate (TOPROL-XL) 50 mg, Oral, Daily, Take with or immediately following a meal.   Multiple Vitamin (MULTIVITAMIN WITH MINERALS) TABS tablet 1 tablet, Oral, Daily   NON FORMULARY Pt uses cpap nightly   nutrition supplement, JUVEN, (JUVEN) PACK 1 packet, Oral, 2 times daily between meals   ondansetron (ZOFRAN-ODT) 4 mg, Oral, Every 8 hours PRN   Oxycodone HCl 10 mg, Oral, 3 times daily PRN   polyethylene glycol powder (GLYCOLAX/MIRALAX) 17 GM/SCOOP powder Take 1 capful (17 g) in water 2 (two) times daily.   potassium chloride SA (KLOR-CON M) 20 MEQ tablet 20 mEq, Oral, Daily   senna-docusate (SENOKOT-S) 8.6-50 MG tablet 2 tablets, Oral, Daily at bedtime   spironolactone (ALDACTONE) 12.5 mg, Oral, Daily   topiramate (TOPAMAX) 25 mg, Oral, Daily at bedtime     VITALS:   Vitals:   03/14/23 1332  BP: 123/83  Pulse: 99  Resp: 20  SpO2: 96%  Height: 5\' 4"  (1.626 m)      PHYSICAL EXAM   HEENT:  Normocephalic, atraumatic. The superficial temporal arteries are without ropiness or tenderness. Cardiovascular: Regular rate and rhythm. Lungs: Clear to auscultation bilaterally. Neck: There are no carotid bruits noted  bilaterally.  NEUROLOGICAL:    03/14/2023    2:00 PM  Montreal Cognitive Assessment   Visuospatial/ Executive (0/5) 5  Naming (0/3) 3  Attention: Read list of digits (0/2) 1  Attention: Read list of letters (0/1) 1  Attention: Serial 7 subtraction starting at 100 (0/3) 1  Language: Repeat phrase (0/2) 1  Language : Fluency (0/1) 0  Abstraction (0/2) 0  Delayed Recall (0/5) 3  Orientation (0/6) 5  Total 20  Adjusted Score (based on education) 20        No data to display           Orientation:  Alert and oriented to person, place and not to date. No aphasia or dysarthria. Fund of knowledge is appropriate. Recent memory impaired and remote memory intact.  Attention and concentration are reduced.  Able to name objects and repeat phrases. Delayed recall 3/5 Cranial nerves: There is good facial symmetry. Extraocular muscles are intact and visual fields are full to confrontational testing. Speech is fluent and clear. No tongue deviation. Hearing is intact to conversational tone. Tone: Tone is good throughout. Sensation: Sensation is intact to light touch. Vibration is intact bilaterally. Coordination: The patient has no difficulty with RAM's or FNF bilaterally. Normal finger to nose  Motor: Strength is 5/5 in the bilateral upper and lower extremities.  She has a wound in the left lower extremity which is being followed at the wound clinic.  There is no pronator drift. There are no fasciculations noted. DTR's: Deep tendon reflexes are 1/4 bilaterally. Gait and Station: Unable to test gait and stride, patient is in wheelchair right now with significant mobility issues as well as pain.   Thank you for allowing [place] the opportunity to participate in the care of this nice patient. Please do not hesitate to contact us for any questions or concerns.   Total time spent on today's visit was 60 minutes dedicated to this patient today, preparing to see patient, examining the patient, ordering  tests and/or medications and counseling the patient, documenting clinical information in the EHR or other health record, independently interpreting results and communicating results to the patient/family, discussing treatment and goals, answering patient's questions and coordinating care.  Cc:  [name], MD  Marlowe Kays 03/14/2023 2:49 PM

## 2023-03-09 NOTE — Progress Notes (Signed)
 Follow Up Note  RE: [name] MRN: [id number] DOB: 05/03/1965 Date of Office Visit: 03/10/2023  Referring provider: Joshua Debby CROME, MD Primary care provider: Joshua Debby CROME, MD  Chief Complaint: penicillin testing and challenge Assessment and Plan: [name] is a 58 y.o. 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 No other reactions to antibiotics. Infectious disease team considering penicillin-based antibiotics (amoxicillin ) from her current regimen of linezolid .  Patient took famotidine  last night and didn't realize it was an antihistamine. Fortunately she still had a good positive control and we completed the skin testing part only as patient was very uncomfortable due to her leg and back and could no longer sit/lay in the chair/bed for any additional time after the skin testing was completed. Skin prick and intradermal testing today to penicillin-G, PrePen were negative.  Please return for the second part -  in office oral drug challenge part on Wednesday 03/12/2023 at 9AM.   Drug challenge instructions:  I will keep the medications in our office until Wednesday. You must be off antihistamines (including famotidine ) for 3-5 days before. Must be in good health and not ill.  No vaccines/injections within the past 7 days.  Plan on being in the office for 2-3 hours.  Return in about 2 days (around 03/12/2023) for Drug challenge.  History of Present Illness: I had the pleasure of seeing [name] for a follow up visit at the [facility] on 03/10/2023. She is a 58 y.o. female, who is being followed for penicillin allergy . Her previous allergy  office visit was on 02/27/2023 with Dr. Luke. Today she is here for amoxicillin /penicillin drug testing and challenge.  She is accompanied today by her husband who  provided/contributed to the history.   Discussed the use of AI scribe software for clinical note transcription with the patient, who gave verbal consent to proceed.  The patient, with a history of heart disease and recent myocardial infarction, was referred for an allergy  test due to a suspected penicillin drug allergy .   She reported a recent episode of indigestion and took famotidine  last night.  The patient is on a regimen of metoprolol , which she usually takes in the morning. She did not take metoprolol  this morning.   The patient is also dealing with an ongoing leg infection, which needs to be resolved before a defibrillator can be implanted. She has an upcoming appointment with an infectious disease specialist this week.      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 No other reactions to antibiotics. Infectious disease team considering penicillin-based antibiotics (amoxicillin ) from her current regimen of linezolid .   Interval History: Patient has not been ill, she has not had any accidental exposures to the culprit medication.   Recent/Current History: Pulmonary disease: no Cardiac disease: yes Has cardiomyopathy.  Respiratory infection: no Rash: yes - unchanged Itch: no Swelling: no Cough: no Shortness of breath: no Runny/stuffy nose: no Itchy eyes: no Beta-blocker use: yes  Patient/guardian was informed of the test procedure with verbalized understanding of the risk of anaphylaxis. Consent was signed.   Last antihistamine use: famotidine  20mg  yesterday. Last beta-blocker use: yesterday in the morning  Medication List:  Current Outpatient Medications  Medication Sig Dispense Refill   acetaminophen  (TYLENOL ) 325 MG tablet Take 1-2 tablets (325-650 mg total) by mouth every 4 (four) hours as  needed for mild pain (pain score 1-3). 100 tablet 0   ALPRAZolam  (XANAX ) 0.5 MG tablet Take 1 tablet (0.5 mg total) by mouth at  bedtime as needed for anxiety (if anxiety unrelieved by hydroxyzine ). 60 tablet 0   amoxicillin  (AMOXIL ) 250 MG/5ML suspension Do NOT take at home. Bring to Dr. Mariella office for drug challenge. Keep in fridge. 80 mL 0   amoxicillin  (AMOXIL ) 500 MG capsule Do NOT take at home. Bring to Dr. Mariella office for drug challenge. 5 capsule 0   atorvastatin  (LIPITOR ) 80 MG tablet Take 1 tablet (80 mg total) by mouth daily. 90 tablet 0   Blood Glucose Monitoring Suppl DEVI 1 each by Does not apply route in the morning, at noon, and at bedtime. May substitute to any manufacturer covered by patient's insurance. 1 each 0   carvedilol  (COREG ) 25 MG tablet Take 25 mg by mouth 2 (two) times daily.     collagenase  (SANTYL ) 250 UNIT/GM ointment Apply 1 Application topically daily. 15 g 0   dapagliflozin  propanediol (FARXIGA ) 10 MG TABS tablet Take 1 tablet (10 mg total) by mouth daily. 90 tablet 1   fluticasone  (FLONASE ) 50 MCG/ACT nasal spray Place 2 sprays into both nostrils daily. (Patient taking differently: Place 2 sprays into both nostrils daily as needed for rhinitis.) 48 g 0   furosemide  (LASIX ) 40 MG tablet Take 1 tablet (40 mg total) by mouth daily. 90 tablet 1   Glucose Blood (BLOOD GLUCOSE TEST STRIPS) STRP 1 each by In Vitro route in the morning, at noon, and at bedtime. May substitute to any manufacturer covered by patient's insurance. 100 strip 11   ketoconazole  (NIZORAL ) 2 % cream Apply 1 Application topically daily. Apply 1gm to skin on plantar aspect of affected foot, and massage in well.  Apply once daily. 60 g 2   linezolid  (ZYVOX ) 600 MG tablet Take 1 tablet (600 mg total) by mouth 2 (two) times daily. 60 tablet 0   losartan  (COZAAR ) 25 MG tablet Take 0.5 tablets (12.5 mg total) by mouth daily. 90 tablet 1   magnesium  oxide (MAG-OX) 400 MG tablet Take 1 tablet (400 mg total) by mouth at bedtime. 90 tablet 1   metFORMIN  (GLUCOPHAGE -XR) 500 MG 24 hr tablet Take 1 tablet (500 mg total) by mouth 2 (two)  times daily with a meal. 180 tablet 1   metoprolol  succinate (TOPROL -XL) 50 MG 24 hr tablet Take 1 tablet (50 mg total) by mouth daily. Take with or immediately following a meal. 90 tablet 1   Multiple Vitamin (MULTIVITAMIN WITH MINERALS) TABS tablet Take 1 tablet by mouth daily. 30 tablet 0   NON FORMULARY Pt uses cpap nightly     nutrition supplement, JUVEN, (JUVEN) PACK Take 1 packet by mouth 2 (two) times daily between meals. 60 packet 0   Oxycodone  HCl 10 MG TABS Take 1 tablet (10 mg total) by mouth 3 (three) times daily as needed. (Patient taking differently: Take 5-10 mg by mouth every 6 (six) hours as needed (pain).) 90 tablet 0   polyethylene glycol powder (GLYCOLAX /MIRALAX ) 17 GM/SCOOP powder Take 1 capful (17 g) in water 2 (two) times daily. (Patient taking differently: Take 17 g by mouth daily.) 238 g 0   potassium chloride  SA (KLOR-CON  M) 20 MEQ tablet Take 1 tablet (20 mEq total) by mouth daily. 90 tablet 1   senna-docusate (SENOKOT-S) 8.6-50 MG tablet Take 2 tablets by mouth at bedtime. 60 tablet 0   spironolactone  (ALDACTONE ) 25 MG tablet  Take 0.5 tablets (12.5 mg total) by mouth daily. 90 tablet 1   topiramate  (TOPAMAX ) 25 MG tablet Take 1 tablet (25 mg total) by mouth at bedtime. 30 tablet 0   No current facility-administered medications for this visit.   Allergies: Allergies  Allergen Reactions   Fluoxetine Other (See Comments)    More depressed   Cymbalta [Duloxetine Hcl] Other (See Comments)    depressed   Pork-Derived Products    Penicillins Rash    Has patient had a PCN reaction causing immediate rash, facial/tongue/throat swelling, SOB or lightheadedness with hypotension: YES Has patient had a PCN reaction causing severe rash involving mucus membranes or skin necrosis: NO Has patient had a PCN reaction that required hospitalization: YES Has patient had a PCN reaction occurring within the last 10 years: NO If all of the above answers are NO, then may proceed with  Cephalosporin use.    I reviewed her past medical history, social history, family history, and environmental history and no significant changes have been reported from her previous visit.  Review of Systems  Constitutional:  Negative for appetite change, chills, fever and unexpected weight change.  HENT:  Negative for rhinorrhea.   Eyes:  Negative for itching.  Respiratory:  Negative for cough, chest tightness, shortness of breath and wheezing.   Cardiovascular:  Negative for chest pain.  Gastrointestinal:  Negative for abdominal pain.  Genitourinary:  Negative for difficulty urinating.  Skin:  Positive for rash.   Objective: LMP  (LMP Unknown)  There is no height or weight on file to calculate BMI. Physical Exam Vitals and nursing note reviewed.  Constitutional:      Appearance: She is well-developed.  HENT:     Head: Normocephalic and atraumatic.     Right Ear: Tympanic membrane and external ear normal.     Left Ear: Tympanic membrane and external ear normal.     Nose: Nose normal.     Mouth/Throat:     Mouth: Mucous membranes are moist.     Pharynx: Oropharynx is clear.  Eyes:     Conjunctiva/sclera: Conjunctivae normal.  Cardiovascular:     Rate and Rhythm: Normal rate and regular rhythm.     Heart sounds: Normal heart sounds. No murmur heard.    No friction rub. No gallop.  Pulmonary:     Effort: Pulmonary effort is normal.     Breath sounds: Normal breath sounds. No wheezing, rhonchi or rales.  Musculoskeletal:     Cervical back: Neck supple.     Comments: Left leg below the knee wrapped in ace bandage.  Skin:    General: Skin is warm.  Neurological:     Mental Status: She is alert and oriented to person, place, and time.     Diagnostics: Skin prick and intradermal testing today to penicillin-G, PrePen were negative. Results discussed with patient/family.  Penicillin - 03/10/23 1154     Manufacturer GREER    Location Arm;Back    Number of allergen test 7     Time Testing Placed 1042    Control SPT Negative    Histamine SPT 4+    Pre-Pen Puncture Negative    Penicillin-G 5000 u/ml SPT Negative    Time Testing Placed 1133    Control Intradermal Negative    Pre-Pen Intradermal Negative    Penicillin-G 50 u/ml Intradermal Negative    Time Testing Placed 1151    Penicillin-G 5000 u/ml Intradermal Negative  Previous notes and tests were reviewed. The plan was reviewed with the patient/family, and all questions/concerned were addressed.  It was my pleasure to see [name] today and participate in her care. Please feel free to contact me with any questions or concerns.  Sincerely,  Orlan Cramp, DO Allergy  & Immunology  [facility]  [facility] office: [phone] [facility] office: [phone]

## 2023-03-10 ENCOUNTER — Encounter: Payer: Self-pay | Admitting: Allergy

## 2023-03-10 ENCOUNTER — Ambulatory Visit (INDEPENDENT_AMBULATORY_CARE_PROVIDER_SITE_OTHER): Payer: Medicare Other | Admitting: Allergy

## 2023-03-10 ENCOUNTER — Telehealth: Payer: Self-pay

## 2023-03-10 ENCOUNTER — Ambulatory Visit (INDEPENDENT_AMBULATORY_CARE_PROVIDER_SITE_OTHER): Payer: Medicare Other | Admitting: Student

## 2023-03-10 VITALS — BP 111/86 | HR 84 | Wt 256.6 lb

## 2023-03-10 DIAGNOSIS — Z880 Allergy status to penicillin: Secondary | ICD-10-CM | POA: Diagnosis not present

## 2023-03-10 DIAGNOSIS — T50995D Adverse effect of other drugs, medicaments and biological substances, subsequent encounter: Secondary | ICD-10-CM | POA: Diagnosis not present

## 2023-03-10 DIAGNOSIS — S81802D Unspecified open wound, left lower leg, subsequent encounter: Secondary | ICD-10-CM

## 2023-03-10 NOTE — Telephone Encounter (Signed)
 Verbal orders has been given

## 2023-03-10 NOTE — Patient Instructions (Addendum)
 Skin prick and intradermal testing today to penicillin-G, PrePen were negative. Please return for the second part - drug challenge on Wednesday 03/12/2023 at 9AM.   Drug challenge instructions:  I will keep the medications in our office until Wednesday. You must be off antihistamines (including famotidine ) for 3-5 days before. Must be in good health and not ill.  No vaccines/injections within the past 7 days.  Plan on being in the office for 2-3 hours.

## 2023-03-10 NOTE — Progress Notes (Signed)
   Referring Provider Joshua Debby CROME, MD [address] [place],  [place] [address]   CC:  Chief Complaint  Patient presents with   Post-op Follow-up      [name] is an 58 y.o. female.  HPI: Patient is a 58 year old female with history of a large wound to her left lower extremity. She underwent debridement of the left lower extremity wound with placement of myriad tissue matrix with Dr. Waddell on 02/06/2023 initially. She then underwent another wound debridement and placement of tissue replacement matrix with Dr. Waddell on 02/24/2023. She presents to the clinic today for postoperative follow-up.   Today, patient is accompanied by her husband at bedside.  She reports that she still feels that some of the sutures are pulling.  She states that she did see sports medicine last week for her right knee and left shoulder for steroid injection which she feels has been helping with her pain.  She states that she has not yet followed up with her PCP, but does have an appointment next week.  She also states that she was getting allergy  tested today for antibiotics, and still plans to follow-up with infectious disease.  She denies any other changes with her wound.  Review of Systems General: Denies any changes in health  Physical Exam    03/10/2023    1:06 PM 03/04/2023    1:54 PM 03/03/2023    1:03 PM  Vitals with BMI  Height  5' 4   Weight 256 lbs 10 oz -- 262 lbs 3 oz  BMI 44.02  44.98  Systolic 111 120 876  Diastolic 86 78 93  Pulse 84 98 95    General:  No acute distress,  Alert and oriented, Non-Toxic, Normal speech and affect On exam, patient is sitting upright in no acute distress.  Prolene sutures and the green Sorbact were removed without any difficulty.  To the superior aspect of the wound, there appears to be unincorporated myriad.  There is some granulation tissue noted just superior of the tendon as well as surrounding the tendon.  Tendon itself does appear to be have some  unincorporated myriad over it as well.  The inferior aspect of the wound appears to have healthy appearing granulation tissue along with some superficial exudate versus unincorporated myriad.  Desiccated unincorporated myriad to the very superior aspect of the wound was trimmed without any difficulty.  There are no overt signs of infection on exam.  Assessment/Plan  Wound of left lower extremity, subsequent encounter   Wound was cleaned with Vashe soaked gauze today.  Adaptic, K-Y jelly, Vashe soaked gauze, ABD pad, Kerlix and Ace wrap were placed over the wound today.  Discussed with the patient and patient's husband that they should do this daily.  Patient and patient's husband expressed understanding.  Encouraged continued follow up with patient's psych provider, PCP and infectious disease.  We will plan to see the patient back next week for reevaluation.  Instructed her to call in the meantime she has any questions or concerns about anything.  Pictures were obtained of the patient and placed in the chart with the patient's or guardian's permission.   [name] 03/10/2023, 4:38 PM

## 2023-03-10 NOTE — Progress Notes (Signed)
 Complex Care Management Care Guide Note  03/10/2023 Name: [name] MRN: [id number] DOB: 1965/12/22  [name] is a 58 y.o. year old female who is a primary care patient of Joshua Debby CROME, MD and is actively engaged with the care management team. I reached out to Glade Nones by phone today to assist with re-scheduling  with the Pharmacist.  Follow up plan: Unsuccessful telephone outreach attempt made. A HIPAA compliant phone message was left for the patient providing contact information and requesting a return call.  Jeoffrey Buffalo , RMA     [facility]  [facility], [facility] Guide  Direct Dial: [phone]  Website: [email or web address]

## 2023-03-10 NOTE — Telephone Encounter (Signed)
 Caller/Agency: Liji with [facility]    Callback Number: [phone]    She called back and stated that no one has contacted her back regarding approval for verbal orders. Crm's were sent last week. She asked if someone from the clinic could call her back with any updates. Pease call and advise with Liji.

## 2023-03-11 ENCOUNTER — Telehealth: Payer: Self-pay

## 2023-03-11 ENCOUNTER — Ambulatory Visit (INDEPENDENT_AMBULATORY_CARE_PROVIDER_SITE_OTHER): Payer: Medicare Other | Admitting: Physician Assistant

## 2023-03-11 VITALS — BP 133/85 | HR 92 | Ht 64.0 in | Wt 257.4 lb

## 2023-03-11 DIAGNOSIS — F988 Other specified behavioral and emotional disorders with onset usually occurring in childhood and adolescence: Secondary | ICD-10-CM

## 2023-03-11 DIAGNOSIS — Z79899 Other long term (current) drug therapy: Secondary | ICD-10-CM | POA: Diagnosis not present

## 2023-03-11 DIAGNOSIS — T8189XA Other complications of procedures, not elsewhere classified, initial encounter: Secondary | ICD-10-CM | POA: Diagnosis not present

## 2023-03-11 DIAGNOSIS — F332 Major depressive disorder, recurrent severe without psychotic features: Secondary | ICD-10-CM | POA: Diagnosis not present

## 2023-03-11 DIAGNOSIS — T8089XA Other complications following infusion, transfusion and therapeutic injection, initial encounter: Secondary | ICD-10-CM | POA: Diagnosis not present

## 2023-03-11 DIAGNOSIS — I152 Hypertension secondary to endocrine disorders: Secondary | ICD-10-CM | POA: Diagnosis not present

## 2023-03-11 DIAGNOSIS — I5023 Acute on chronic systolic (congestive) heart failure: Secondary | ICD-10-CM | POA: Diagnosis not present

## 2023-03-11 DIAGNOSIS — I5032 Chronic diastolic (congestive) heart failure: Secondary | ICD-10-CM | POA: Diagnosis not present

## 2023-03-11 DIAGNOSIS — F419 Anxiety disorder, unspecified: Secondary | ICD-10-CM

## 2023-03-11 DIAGNOSIS — I96 Gangrene, not elsewhere classified: Secondary | ICD-10-CM | POA: Diagnosis not present

## 2023-03-11 MED ORDER — LORAZEPAM 0.5 MG PO TABS: 0.5000 mg | ORAL_TABLET | Freq: Two times a day (BID) | ORAL | 0 refills | Status: DC | PRN

## 2023-03-11 MED ORDER — HYDROXYZINE HCL 25 MG PO TABS: 25.0000 mg | ORAL_TABLET | Freq: Three times a day (TID) | ORAL | 1 refills | Status: DC | PRN

## 2023-03-11 NOTE — Progress Notes (Signed)
 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  Patient is currently being managed on the following psychiatric medications:  Trintellix  5 mg daily Alprazolam  0.5 mg at bedtime Topiramate  25 mg at bedtime Hydroxyzine  25 mg 3 times daily  Patient presents to the encounter disappointed due to the treatment she has received from the facility.  Patient reports that this facility has failed her stating that every time she turns around, she is being screwed.  She reports that her mental health is important and she is currently falling apart.  She recalls an incident where she called this service from the parking lot of her appointment she just got done with stating that she would like to do her appointment that was scheduled for her that day.  Patient reports that she was not able to attend the appointment due to this service canceling it.  Patient states that she would like to receive better treatment from this facility from here on out.  Since the last encounter, patient reports that she has not been taking her Trintellix  because her infectious disease provider told her that the antibiotics that she is currently on cross react with the antidepressant.  Patient is currently on linezolid .  Starting Trintellix  on patient's who are on linezolid  is contraindicated due to risk of serotonin syndrome.  Patient was instructed to withhold from using Trintellix  while on linezolid .  Patient reports that she is currently in the process of possibly switching her use of  linezolid  with penicillin after determining if the patient experiences an allergic reaction to the use of penicillin.  In regards to her use of alprazolam , patient reports that she has not felt any relief from stress or anxiety since using the medication.  Patient reports that her stomach is often in knots and she often clenches her teeth due to anxiety.  In addition to her anxiety, patient endorses being in pain 24/7.  Patient describes being extremely desperate due to the amount of pain she is on.  Patient rates her anxiety a 20 out of 10 with 10 being most severe.  In addition to her anxiety, patient also endorses depression.  She reports that she just came back from sports medicine and received an injection in her knee and back to alleviate the pain that she is in.  She reports that she experiences constant breathing from utilizing her life without medical equipment.  Patient endorses issues with smelling and eating and health been unable to eat regularly since her heart attack.  A PHQ-9 screen was performed with the patient scoring a 27.  A GAD-7 screen was also performed with the patient scored a 21.  Patient is alert and oriented x 4, restless yet cooperative, and engaged in conversation during the encounter.  Patient endorses anger and frustration due to the amount of pain she given.  She also endorses anxiety and hopelessness due to her current mental state.  Patient exhibits depressed and anxious mood with congruent affect.  Patient denied suicidal or homicidal ideations.  She further denies auditory or visual hallucinations and does not appear to be responding  to internal/external stimuli.  Patient endorses intermittent sleep currently is on average 8 to 9 hours of sleep per night.  Patient reports that her sleep is often interrupted by the amount of pain she is in.  Patient endorses fair appetite means on average 1-2 meals per day.  Patient denies alcohol consumption, tobacco use, or illicit drug  use.  Visit Diagnosis:    ICD-10-CM   1. Long-term current use of benzodiazepine  Z79.899 Urine Drug Panel 7    2. Anxiety  F41.9 LORazepam  (ATIVAN ) 0.5 MG tablet    hydrOXYzine  (ATARAX ) 25 MG tablet    3. Major depressive disorder, recurrent episode, severe with anxious distress (HCC)  F33.2     4. 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   Past Medical History:  Past Medical History:  Diagnosis Date   ADD (attention deficit disorder)    Anxiety    Arthritis    both knees right worse than left   Carpal tunnel syndrome of right wrist    Depression    Essential hypertension, benign    Gallstones    History of smoking 06/22/2016   Hyperlipidemia associated with type 2 diabetes mellitus (HCC), on Zocor  04/04/2011   Hypertension associated with diabetes (HCC) 06/03/2008   Migraines    Morbid obesity (HCC)    Morbid obesity with BMI of 50.0-59.9, adult (HCC) 07/12/2006   NICM (nonischemic cardiomyopathy) (HCC) 07/12/2006   01/16/18 ECHO:    - Procedure narrative: Transthoracic echocardiography. Image   quality was suboptimal. The study was technically difficult.   Intravenous contrast (Definity ) was administered. - Left ventricle: The cavity size was moderately dilated. Wall   thickness was increased in a pattern of mild LVH. Systolic   function was moderately to severely reduced. The estimated   ejection fraction w   Normal coronary arteries 04/17/2016   OSA on CPAP 10/28/2014   Spinal stenosis    back pain   Spondylosis without myelopathy or radiculopathy, lumbar region 12/04/2017   Type 2 diabetes mellitus with hyperglycemia [facility])     Past Surgical History:  Procedure Laterality Date   CHOLECYSTECTOMY     DILATATION & CURETTAGE/HYSTEROSCOPY WITH MYOSURE N/A 10/31/2017   Procedure: DILATATION &  CURETTAGE/HYSTEROSCOPY WITH MYOSURE;  Surgeon: Jannis Kate Norris, MD;  Location: [facility] ORS;  Service: Gynecology;  Laterality: N/A;   HYSTEROSCOPY WITH D & C N/A 05/07/2022   Procedure: DILATATION AND CURETTAGE /HYSTEROSCOPY;  Surgeon: Jeralyn Crutch, MD;  Location: [facility] OR;  Service: Gynecology;  Laterality: N/A;   INCISION AND DRAINAGE OF WOUND Left 02/06/2023   Procedure: debridement of left leg wound, application of myriad;  Surgeon: Waddell Leonce NOVAK, MD;  Location: [facility] OR;  Service: Plastics;  Laterality: Left;   INCISION AND DRAINAGE OF WOUND Left 02/24/2023   Procedure: application of tissue replacement matrix of left patellar tendon;  Surgeon: Waddell Leonce NOVAK, MD;  Location: [facility] OR;  Service: Plastics;  Laterality: Left;   IRRIGATION AND DEBRIDEMENT KNEE  02/24/2023   Procedure: IRRIGATION AND DEBRIDEMENT KNEE;  Surgeon: Waddell Leonce NOVAK, MD;  Location: [facility] OR;  Service: Plastics;;   life vest     RIGHT/LEFT HEART CATH AND CORONARY ANGIOGRAPHY N/A 04/11/2016   Procedure: Right/Left Heart Cath and Coronary Angiography;  Surgeon: Lonni JONETTA Cash, MD;  Location: [facility] INVASIVE CV LAB;  Service: Cardiovascular;  Laterality: N/A;   RIGHT/LEFT HEART CATH AND CORONARY ANGIOGRAPHY N/A 12/09/2022   Procedure: RIGHT/LEFT HEART CATH AND CORONARY ANGIOGRAPHY;  Surgeon: Ladona Heinz, MD;  Location: [facility] INVASIVE CV LAB;  Service: Cardiovascular;  Laterality: N/A;   sonogram for blood clots     no blockages    Family Psychiatric History:  Patient reports that her mother had several psych issues including depression, anxiety, and possible OCD Father - OCD Son - Asperger's, ADD Grandmother (ma) - depression, anxiety. History of some form of electric/stimulation therapy 2 Uncles - depression, anxiety 2nd cousins - OCD, ADD, depression, anxiety   Family history of suicide attempt: Patient reports that her grandmother's brother's daughter attempted suicide Family history of homicide attempt:  Patient denies Family history of substance abuse: Patient denies  Family History:  Family History  Problem Relation Age of Onset   Depression Mother    Anxiety disorder Mother    Diabetes Mother    Hypertension Mother    ADD / ADHD Father    Diabetes Father    Hypertension Father    Colitis Father    Hypertension Other    Diabetes Other    Colitis Other    Alcohol abuse Other     Social History:  Social History   Socioeconomic History   Marital status: Married    Spouse name: [name]   Number of children: 1   Years of education: 16   Highest education level: Associate degree: academic program  Occupational History   Not on file  Tobacco Use   Smoking status: Former    Current packs/day: 0.00    Types: Cigarettes    Quit date: 02/25/1997    Years since quitting: 26.0   Smokeless tobacco: Never   Tobacco comments:    Married, lives with spouse (when he is not traveling) and son  Vaping Use   Vaping status: Never Used  Substance and Sexual Activity   Alcohol use: No   Drug use: No   Sexual activity: Not Currently    Partners: Male    Birth control/protection: None  Other Topics Concern   Not on file  Social History Narrative   Right handed      Two story home   Lives with husband   No caffeine   Disablitiy    Social Drivers of Health   Financial Resource Strain: Low Risk  (02/10/2023)   Overall Financial Resource Strain (CARDIA)    Difficulty of Paying Living Expenses: Not hard at all  Food Insecurity: No Food Insecurity (02/10/2023)   Hunger Vital Sign    Worried About Running Out of Food in the Last Year: Never true    Ran Out of Food in the Last Year: Never true  Transportation Needs: No Transportation Needs (02/10/2023)   PRAPARE - Administrator, Civil Service (Medical): No    Lack of Transportation (Non-Medical): No  Physical Activity: Inactive (02/10/2023)   Exercise Vital Sign    Days of Exercise per Week: 0 days    Minutes of  Exercise per Session: 0 min  Stress: Stress Concern Present (02/10/2023)   [employer] of Occupational Health - Occupational Stress Questionnaire    Feeling of Stress : Very much  Social Connections: Socially Isolated (02/10/2023)   Social Connection and Isolation Panel [NHANES]    Frequency of Communication with Friends and Family: Once a week    Frequency of Social Gatherings with Friends and Family: Once a week    Attends  Religious Services: Never    Active Member of Clubs or Organizations: No    Attends Banker Meetings: Never    Marital Status: Married    Allergies:  Allergies  Allergen Reactions   Fluoxetine Other (See Comments)    More depressed   Cymbalta [Duloxetine Hcl] Other (See Comments)    depressed   Pork-Derived Products     Metabolic Disorder Labs: Lab Results  Component Value Date   HGBA1C 6.6 (H) 12/07/2022   MPG 142.72 12/07/2022   MPG 140 05/02/2022   No results found for: PROLACTIN Lab Results  Component Value Date   CHOL 194 08/13/2022   TRIG 208 (H) 12/06/2022   HDL 48.90 08/13/2022   CHOLHDL 4 08/13/2022   VLDL 27.6 08/13/2022   LDLCALC 117 (H) 08/13/2022   LDLCALC 86 03/01/2021   Lab Results  Component Value Date   TSH 4.29 01/15/2023   TSH 2.130 05/16/2022    Therapeutic Level Labs: No results found for: LITHIUM No results found for: VALPROATE No results found for: CBMZ  Current Medications: Current Outpatient Medications  Medication Sig Dispense Refill   hydrOXYzine  (ATARAX ) 25 MG tablet Take 1 tablet (25 mg total) by mouth 3 (three) times daily as needed. 90 tablet 1   LORazepam  (ATIVAN ) 0.5 MG tablet Take 1 tablet (0.5 mg total) by mouth 2 (two) times daily as needed for anxiety. (Patient not taking: Reported on 03/14/2023) 30 tablet 0   acetaminophen  (TYLENOL ) 325 MG tablet Take 1-2 tablets (325-650 mg total) by mouth every 4 (four) hours as needed for mild pain (pain score 1-3). 100 tablet 0    amoxicillin  (AMOXIL ) 250 MG/5ML suspension Take 15 mLs (750 mg total) by mouth 3 (three) times daily. 4100 mL 1   atorvastatin  (LIPITOR ) 80 MG tablet Take 1 tablet (80 mg total) by mouth daily. 90 tablet 0   Blood Glucose Monitoring Suppl DEVI 1 each by Does not apply route in the morning, at noon, and at bedtime. May substitute to any manufacturer covered by patient's insurance. 1 each 0   carvedilol  (COREG ) 25 MG tablet Take 25 mg by mouth 2 (two) times daily. (Patient not taking: Reported on 03/14/2023)     collagenase  (SANTYL ) 250 UNIT/GM ointment Apply 1 Application topically daily. 15 g 0   dapagliflozin  propanediol (FARXIGA ) 10 MG TABS tablet Take 1 tablet (10 mg total) by mouth daily. 90 tablet 1   fluticasone  (FLONASE ) 50 MCG/ACT nasal spray Place 2 sprays into both nostrils daily. (Patient taking differently: Place 2 sprays into both nostrils daily as needed for rhinitis.) 48 g 0   furosemide  (LASIX ) 40 MG tablet Take 1 tablet (40 mg total) by mouth daily. 90 tablet 1   Glucose Blood (BLOOD GLUCOSE TEST STRIPS) STRP 1 each by In Vitro route in the morning, at noon, and at bedtime. May substitute to any manufacturer covered by patient's insurance. 100 strip 11   ketoconazole  (NIZORAL ) 2 % cream Apply 1 Application topically daily. Apply 1gm to skin on plantar aspect of affected foot, and massage in well.  Apply once daily. 60 g 2   linezolid  (ZYVOX ) 600 MG tablet Take 1 tablet (600 mg total) by mouth 2 (two) times daily. 60 tablet 0   losartan  (COZAAR ) 25 MG tablet Take 0.5 tablets (12.5 mg total) by mouth daily. 90 tablet 1   magnesium  oxide (MAG-OX) 400 MG tablet Take 1 tablet (400 mg total) by mouth at bedtime. 90 tablet 1   metFORMIN  (GLUCOPHAGE -XR)  500 MG 24 hr tablet Take 1 tablet (500 mg total) by mouth 2 (two) times daily with a meal. 180 tablet 1   metoprolol  succinate (TOPROL -XL) 50 MG 24 hr tablet Take 1 tablet (50 mg total) by mouth daily. Take with or immediately following a meal.  90 tablet 1   Multiple Vitamin (MULTIVITAMIN WITH MINERALS) TABS tablet Take 1 tablet by mouth daily. 30 tablet 0   NON FORMULARY Pt uses cpap nightly     nutrition supplement, JUVEN, (JUVEN) PACK Take 1 packet by mouth 2 (two) times daily between meals. 60 packet 0   ondansetron  (ZOFRAN -ODT) 4 MG disintegrating tablet Take 1 tablet (4 mg total) by mouth every 8 (eight) hours as needed for nausea or vomiting. 60 tablet 1   Oxycodone  HCl 10 MG TABS Take 1 tablet (10 mg total) by mouth 3 (three) times daily as needed. (Patient taking differently: Take 5-10 mg by mouth every 6 (six) hours as needed (pain).) 90 tablet 0   polyethylene glycol powder (GLYCOLAX /MIRALAX ) 17 GM/SCOOP powder Take 1 capful (17 g) in water 2 (two) times daily. (Patient taking differently: Take 17 g by mouth daily.) 238 g 0   potassium chloride  SA (KLOR-CON  M) 20 MEQ tablet Take 1 tablet (20 mEq total) by mouth daily. 90 tablet 1   senna-docusate (SENOKOT-S) 8.6-50 MG tablet Take 2 tablets by mouth at bedtime. 60 tablet 0   spironolactone  (ALDACTONE ) 25 MG tablet Take 0.5 tablets (12.5 mg total) by mouth daily. 90 tablet 1   topiramate  (TOPAMAX ) 25 MG tablet Take 1 tablet (25 mg total) by mouth at bedtime. 30 tablet 0   No current facility-administered medications for this visit.     Musculoskeletal: Strength & Muscle Tone: within normal limits Gait & Station: normal Patient leans: N/A  Psychiatric Specialty Exam: Review of Systems  Psychiatric/Behavioral:  Positive for agitation, decreased concentration, dysphoric mood and sleep disturbance. Negative for hallucinations, self-injury and suicidal ideas. The patient is nervous/anxious. The patient is not hyperactive.     Blood pressure 133/85, pulse 92, height 5' 4 (1.626 m), weight 257 lb 6.4 oz (116.8 kg), SpO2 96%.Body mass index is 44.18 kg/m.  General Appearance: Casual  Eye Contact:  Good  Speech:  Clear and Coherent and Normal Rate  Volume:  Normal  Mood:   Anxious, Depressed, and Irritable  Affect:  Congruent  Thought Process:  Coherent, Goal Directed, and Descriptions of Associations: Intact  Orientation:  Full (Time, Place, and Person)  Thought Content: Tangential   Suicidal Thoughts:  No  Homicidal Thoughts:  No  Memory:  Immediate;   Fair Recent;   Fair Remote;   Fair  Judgement:  Good  Insight:  Present  Psychomotor Activity:  Restlessness due to pain  Concentration:  Concentration: Fair and Attention Span: Fair  Recall:  [employer] of Knowledge: Good  Language: Good  Akathisia:  No  Handed:  Right  AIMS (if indicated): not done  Assets:  Communication Skills Desire for Improvement Financial Resources/Insurance Housing Intimacy Social Support Transportation  ADL's:  Intact  Cognition: WNL  Sleep:  Poor   Screenings: AUDIT    Flowsheet Row [facility]  Alcohol Use Disorder Identification Test Final Score (AUDIT) 0      GAD-7    Flowsheet Row Clinical Support from 03/11/2023 in [facility] Clinical Support from 12/05/2022 in [facility] Office Visit from 10/24/2022 in [place]  [facility] Office Visit from 06/19/2022 in Center for [facility] at [facility] Office Visit from 05/02/2022 in Center for [facility] at [facility]  Total GAD-7 Score 21 16 14 17 17       PHQ2-9    Flowsheet Row Clinical Support from 03/11/2023 in [facility] Office Visit from 02/13/2023 in [facility] Infect Dis - [facility] Dept [facility] [place] Clinical Support from 02/10/2023 in [facility] at [facility] Patient Outreach Telephone from 01/17/2023 in [place] POPULATION HEALTH DEPARTMENT Clinical Support from 12/05/2022 in [facility]  PHQ-2  Total Score 6 1 6 6 6   PHQ-9 Total Score 27 -- 17 15 21       Flowsheet Row Clinical Support from 03/11/2023 in [facility] Admission (Discharged) from 02/24/2023 in [place] PERIOPERATIVE AREA Admission (Discharged) from 02/06/2023 in [place] 6E Progressive Care  C-SSRS RISK CATEGORY Moderate Risk Error: Question 6 not populated Error: Question 6 not populated        Assessment and Plan:   Itzabella Sorrels is a 58 year old female with a past psychiatric history significant for major depressive disorder (severe, with anxious distress), anxiety, and 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  Patient presents to the encounter frustrated with the service that she has received from this facility.  Patient recounts an incident where her most recent appointment was canceled without her consent.  Provider informed patient that her future appointment will not be canceled without her knowing about it.  Patient reports that she has not been taking Trintellix  due to being on linezolid .  She reports that she was instructed by her infectious disease provider not to take Trintellix  due to the contraindication with individuals that are on linezolid .  Patient informed provider that she is currently in the process of switching her use of linezolid  with penicillin.  She reports that she will be undergoing a penicillin challenge to determine if she has an adverse reaction to penicillin.  Patient continues to endorse ongoing depression and anxiety.  She reports that her use of alprazolam  has been ineffective in managing her anxiety.  Patient rates her anxiety at 20 out of 10.  Contributing factors to patient's anxiety include ongoing pain, medical issues, and stressors.  She reports that she has used clonazepam  in the past for the management of her anxiety but states that  the medication was also ineffective.  Due to patient's ongoing anxiety, provider recommended patient exchange her use of alprazolam  to lorazepam  0.5 mg 2 times daily as needed.  Patient was agreeable to recommendation.  Patient's PDMP was reviewed following the conclusion of the encounter.  In addition to patient being placed on lorazepam , provider recommended patient be placed on hydroxyzine  25 mg 3 times daily as needed for the management of her anxiety.  Patient was encouraged to use hydroxyzine  as a means to managing her anxiety first before lorazepam .  Patient vocalized understanding.  Provider informed patient that a urine drug screen would need to be completed due to her use of benzodiazepine.  Patient vocalized understanding.  Collaboration of Care: Collaboration of Care: Medication Management AEB provider managing patient's psychiatric medications, Primary Care Provider AEB patient being seen by internal medicine, Psychiatrist AEB patient being followed by mental health provider at this facility, Other provider involved in patient's  care AEB patient being seen by cardiology, podiatry, and neurology, and Referral or follow-up with counselor/therapist AEB patient is set up with a licensed clinical social worker at this facility  Patient/Guardian was advised Release of Information must be obtained prior to any record release in order to collaborate their care with an outside provider. Patient/Guardian was advised if they have not already done so to contact the registration department to sign all necessary forms in order for us  to release information regarding their care.   Consent: Patient/Guardian gives verbal consent for treatment and assignment of benefits for services provided during this visit. Patient/Guardian expressed understanding and agreed to proceed.   1. Anxiety  - LORazepam  (ATIVAN ) 0.5 MG tablet; Take 1 tablet (0.5 mg total) by mouth 2 (two) times daily as needed for anxiety.   Dispense: 30 tablet; Refill: 0 - hydrOXYzine  (ATARAX ) 25 MG tablet; Take 1 tablet (25 mg total) by mouth 3 (three) times daily as needed.  Dispense: 90 tablet; Refill: 1  2. Long-term current use of benzodiazepine (Primary)  - Urine Drug Panel 7; Future  3. Major depressive disorder, recurrent episode, severe with anxious distress (HCC) Patient unable to be started on Trintellix  due to her use of linezolid  which is contraindicated. Provider discussed with Dr. Cambronne about antidepressant options for the management of patient's depressive symptoms It was discussed that patient could be placed on a low dose of sertraline  25 mg daily given patient's past history of heart attack Sertraline  does not cause QTc prolongation Provider to contact patient to discuss placing patient on sertraline   4. ADD (attention deficit disorder) without hyperactivity Hold off on stimulant medication due to patient's history of cardiomyopathy  Patient to follow up in 4 weeks Provider spent a total of 60+ minutes with the patient/reviewing patient's chart  Reginia FORBES Bolster, PA 03/11/2023, 8:18 PM

## 2023-03-11 NOTE — Progress Notes (Signed)
 Follow Up Note  RE: [name] MRN: [id number] DOB: [birth year] Date of Office Visit: 03/12/2023  Referring provider: Arcadio Knuckles, MD Primary care provider: Arcadio Knuckles, MD  Chief Complaint: drug challenge  Assessment and Plan: [name] is a 58 y.o. 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 No other reactions to antibiotics. Infectious disease team considering penicillin-based antibiotics (amoxicillin ) from her current regimen of linezolid . 2025 Skin prick and intradermal testing today to penicillin-G, PrePen were negative.  Patient was given amoxicillin . She received a total of 505mg  in divided doses today.  She tolerated the medication with no side effects.  For next 24 hours monitor for hives, swelling, shortness of breath and dizziness. If you see these symptoms, use Benadryl  for mild symptoms and for more severe symptoms go to ER/urgent care or call 911. Your risk of developing penicillin allergy  in the future is the same as the general population's.  May take penicillin type of antibiotics in the future if needed.   Return if symptoms worsen or fail to improve.  Plan: Challenge drug: amoxicillin  Challenge as per protocol: Passed Total time: 122 min.  History of Present Illness: I had the pleasure of seeing [name] for a follow up visit at the [facility] on 03/12/2023. She is a 58 y.o. female, who is being followed for drug allergies. Her previous allergy  office visit was on 03/10/2023 with Dr. Burdette Carolin. Today she is here for amoxicillin  drug challenge.  She is accompanied today by her husband who provided/contributed to the history.   Discussed the use of AI scribe software for clinical note transcription with the patient, who gave verbal consent to proceed.  The patient expresses feelings of  nervousness but denies taking Xanax  on the day of the visit. She also reports recent episodes of diarrhea, which she attributes to her current antibiotic regimen. She denies any dizziness but does report feeling tired. She also mentions occasional drops in blood pressure, with readings sometimes as low as 96/70.  The patient has been wearing a LifeVest, which has caused some skin irritation. She also reports pressure sores. She denies any new rashes or skin changes.  The patient is scheduled to see an infectious disease specialist the following day.     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 No other reactions to antibiotics. Infectious disease team considering penicillin-based antibiotics (amoxicillin ) from her current regimen of linezolid .   Interval History: Patient has not been ill, she has not had any accidental exposures to the culprit medication.   Recent/Current History: Pulmonary disease: no  Cardiac disease: yes Has cardiomyopathy.  Respiratory infection: no Rash: yes - unchanged Itch: no Swelling: no Cough: no Shortness of breath: no Runny/stuffy nose: no Itchy eyes: no Beta-blocker use: yes  Patient/guardian was informed of the test procedure with verbalized understanding of the risk of anaphylaxis. Consent was signed.   Last antihistamine use: none in the past 3 days.  Last beta-blocker use: 1 hour ago.   Medication List:  Current Outpatient Medications  Medication Sig Dispense Refill   acetaminophen  (TYLENOL ) 325 MG tablet Take 1-2 tablets (325-650 mg total) by mouth every 4 (four) hours as needed for mild pain (pain score 1-3). 100 tablet 0   atorvastatin  (LIPITOR ) 80 MG tablet Take 1 tablet (80 mg total) by mouth daily. 90 tablet 0  Blood Glucose Monitoring Suppl DEVI 1 each by Does not apply route in the morning, at noon, and at bedtime. May substitute to any manufacturer covered by patient's insurance. 1 each 0    carvedilol  (COREG ) 25 MG tablet Take 25 mg by mouth 2 (two) times daily.     collagenase  (SANTYL ) 250 UNIT/GM ointment Apply 1 Application topically daily. 15 g 0   dapagliflozin  propanediol (FARXIGA ) 10 MG TABS tablet Take 1 tablet (10 mg total) by mouth daily. 90 tablet 1   fluticasone  (FLONASE ) 50 MCG/ACT nasal spray Place 2 sprays into both nostrils daily. (Patient taking differently: Place 2 sprays into both nostrils daily as needed for rhinitis.) 48 g 0   furosemide  (LASIX ) 40 MG tablet Take 1 tablet (40 mg total) by mouth daily. 90 tablet 1   Glucose Blood (BLOOD GLUCOSE TEST STRIPS) STRP 1 each by In Vitro route in the morning, at noon, and at bedtime. May substitute to any manufacturer covered by patient's insurance. 100 strip 11   hydrOXYzine  (ATARAX ) 25 MG tablet Take 1 tablet (25 mg total) by mouth 3 (three) times daily as needed. 90 tablet 1   ketoconazole  (NIZORAL ) 2 % cream Apply 1 Application topically daily. Apply 1gm to skin on plantar aspect of affected foot, and massage in well.  Apply once daily. 60 g 2   linezolid  (ZYVOX ) 600 MG tablet Take 1 tablet (600 mg total) by mouth 2 (two) times daily. 60 tablet 0   LORazepam  (ATIVAN ) 0.5 MG tablet Take 1 tablet (0.5 mg total) by mouth 2 (two) times daily as needed for anxiety. 30 tablet 0   losartan  (COZAAR ) 25 MG tablet Take 0.5 tablets (12.5 mg total) by mouth daily. 90 tablet 1   magnesium  oxide (MAG-OX) 400 MG tablet Take 1 tablet (400 mg total) by mouth at bedtime. 90 tablet 1   metFORMIN  (GLUCOPHAGE -XR) 500 MG 24 hr tablet Take 1 tablet (500 mg total) by mouth 2 (two) times daily with a meal. 180 tablet 1   metoprolol  succinate (TOPROL -XL) 50 MG 24 hr tablet Take 1 tablet (50 mg total) by mouth daily. Take with or immediately following a meal. 90 tablet 1   Multiple Vitamin (MULTIVITAMIN WITH MINERALS) TABS tablet Take 1 tablet by mouth daily. 30 tablet 0   NON FORMULARY Pt uses cpap nightly     nutrition supplement, JUVEN,  (JUVEN) PACK Take 1 packet by mouth 2 (two) times daily between meals. 60 packet 0   Oxycodone  HCl 10 MG TABS Take 1 tablet (10 mg total) by mouth 3 (three) times daily as needed. (Patient taking differently: Take 5-10 mg by mouth every 6 (six) hours as needed (pain).) 90 tablet 0   polyethylene glycol powder (GLYCOLAX /MIRALAX ) 17 GM/SCOOP powder Take 1 capful (17 g) in water 2 (two) times daily. (Patient taking differently: Take 17 g by mouth daily.) 238 g 0   potassium chloride  SA (KLOR-CON  M) 20 MEQ tablet Take 1 tablet (20 mEq total) by mouth daily. 90 tablet 1   senna-docusate (SENOKOT-S) 8.6-50 MG tablet Take 2 tablets by mouth at bedtime. 60 tablet 0   spironolactone  (ALDACTONE ) 25 MG tablet Take 0.5 tablets (12.5 mg total) by mouth daily. 90 tablet 1   topiramate  (TOPAMAX ) 25 MG tablet Take 1 tablet (25 mg total) by mouth at bedtime. 30 tablet 0   No current facility-administered medications for this visit.   Allergies: Allergies  Allergen Reactions   Fluoxetine Other (See Comments)    More depressed  Cymbalta [Duloxetine Hcl] Other (See Comments)    depressed   Pork-Derived Products    I reviewed her past medical history, social history, family history, and environmental history and no significant changes have been reported from her previous visit.  Review of Systems  Constitutional:  Negative for appetite change, chills, fever and unexpected weight change.  HENT:  Negative for congestion and rhinorrhea.   Eyes:  Negative for itching.  Respiratory:  Negative for cough, chest tightness, shortness of breath and wheezing.   Cardiovascular:  Negative for chest pain.  Gastrointestinal:  Negative for abdominal pain.  Genitourinary:  Negative for difficulty urinating.  Skin:  Positive for rash.  Neurological:  Negative for headaches.   Objective: BP 90/74   Pulse 90   Temp 98.1 F (36.7 C)   Resp 20   Ht 5\' 4"  (1.626 m)   Wt 257 lb (116.6 kg)   LMP  (LMP Unknown)   SpO2  96%   BMI 44.11 kg/m  Body mass index is 44.11 kg/m. Physical Exam Vitals and nursing note reviewed.  Constitutional:      Appearance: Normal appearance. She is well-developed.  HENT:     Head: Normocephalic and atraumatic.     Right Ear: Tympanic membrane and external ear normal.     Left Ear: Tympanic membrane and external ear normal.     Nose: Nose normal.     Mouth/Throat:     Mouth: Mucous membranes are moist.     Pharynx: Oropharynx is clear.  Eyes:     Conjunctiva/sclera: Conjunctivae normal.  Cardiovascular:     Rate and Rhythm: Normal rate and regular rhythm.     Heart sounds: Normal heart sounds. No murmur heard.    No friction rub. No gallop.  Pulmonary:     Effort: Pulmonary effort is normal.     Breath sounds: Normal breath sounds. No wheezing, rhonchi or rales.  Musculoskeletal:     Cervical back: Neck supple.     Comments: Left leg below the knee wrapped in ace bandage.  Skin:    General: Skin is warm.     Findings: No rash.  Neurological:     Mental Status: She is alert and oriented to person, place, and time.  Psychiatric:        Behavior: Behavior normal.     Diagnostics: Results discussed with patient/family.  Oral Challenge - 03/12/23 0900     Challenge Food/Drug Amoxicillin     Food/Drug provided by Patient    BP 108/80    Pulse 90    Respirations 20    Lungs 97    Skin Clear    Mouth Clear    Time 0932    Dose 0.56mL    Time 0957    Dose 1mL    Time 1013    Dose 2mL    Time 1030    Dose 7mL    BP 90/70    Pulse 90    Respirations 20    Lungs 96    Skin Clear    Mouth Clear             Previous notes and tests were reviewed. The plan was reviewed with the patient/family, and all questions/concerned were addressed.  It was my pleasure to see Adalyna today and participate in her care. Please feel free to contact me with any questions or concerns.  Sincerely,  Eudelia Hero, DO Allergy  & Immunology  [facility]   [facility] office: [phone] [facility] office: [phone]

## 2023-03-11 NOTE — Telephone Encounter (Signed)
 Faxed new wound care orders to [facility] with confirmed receipt. Will scan into patient's chart.

## 2023-03-12 ENCOUNTER — Encounter: Payer: Self-pay | Admitting: Allergy

## 2023-03-12 ENCOUNTER — Encounter (HOSPITAL_COMMUNITY): Payer: Self-pay | Admitting: Physician Assistant

## 2023-03-12 ENCOUNTER — Ambulatory Visit (INDEPENDENT_AMBULATORY_CARE_PROVIDER_SITE_OTHER): Payer: Medicare Other | Admitting: Allergy

## 2023-03-12 VITALS — BP 90/74 | HR 90 | Temp 98.1°F | Resp 20 | Ht 64.0 in | Wt 257.0 lb

## 2023-03-12 DIAGNOSIS — Z880 Allergy status to penicillin: Secondary | ICD-10-CM | POA: Diagnosis not present

## 2023-03-12 DIAGNOSIS — T50995D Adverse effect of other drugs, medicaments and biological substances, subsequent encounter: Secondary | ICD-10-CM

## 2023-03-12 NOTE — Progress Notes (Signed)
 Thanks Dr Burdette Carolin Negative skin test and oral challenge noted

## 2023-03-12 NOTE — Patient Instructions (Addendum)
 For next 24 hours monitor for hives, swelling, shortness of breath and dizziness. If you see these symptoms, use Benadryl  for mild symptoms and for more severe symptoms go to ER/urgent care or call 911.  You tolerated a total of 505mg  of amoxicillin  today.  Your risk of developing penicillin allergy  in the future is the same as the general population's.  May take penicillin type of antibiotics in the future if needed.   Follow up with me only if needed.

## 2023-03-13 ENCOUNTER — Other Ambulatory Visit: Payer: Self-pay

## 2023-03-13 ENCOUNTER — Ambulatory Visit: Payer: Medicare Other | Admitting: Internal Medicine

## 2023-03-13 ENCOUNTER — Ambulatory Visit: Payer: Medicare Other

## 2023-03-13 ENCOUNTER — Encounter: Payer: Self-pay | Admitting: Internal Medicine

## 2023-03-13 VITALS — BP 116/83 | HR 84 | Temp 98.0°F | Ht 64.0 in | Wt 257.0 lb

## 2023-03-13 DIAGNOSIS — A429 Actinomycosis, unspecified: Secondary | ICD-10-CM | POA: Diagnosis not present

## 2023-03-13 DIAGNOSIS — L089 Local infection of the skin and subcutaneous tissue, unspecified: Secondary | ICD-10-CM | POA: Diagnosis not present

## 2023-03-13 DIAGNOSIS — S81802D Unspecified open wound, left lower leg, subsequent encounter: Secondary | ICD-10-CM

## 2023-03-13 DIAGNOSIS — T8189XA Other complications of procedures, not elsewhere classified, initial encounter: Secondary | ICD-10-CM | POA: Diagnosis not present

## 2023-03-13 DIAGNOSIS — I96 Gangrene, not elsewhere classified: Secondary | ICD-10-CM | POA: Diagnosis not present

## 2023-03-13 DIAGNOSIS — I5023 Acute on chronic systolic (congestive) heart failure: Secondary | ICD-10-CM | POA: Diagnosis not present

## 2023-03-13 DIAGNOSIS — I152 Hypertension secondary to endocrine disorders: Secondary | ICD-10-CM | POA: Diagnosis not present

## 2023-03-13 DIAGNOSIS — B952 Enterococcus as the cause of diseases classified elsewhere: Secondary | ICD-10-CM

## 2023-03-13 DIAGNOSIS — I5032 Chronic diastolic (congestive) heart failure: Secondary | ICD-10-CM | POA: Diagnosis not present

## 2023-03-13 DIAGNOSIS — T8089XA Other complications following infusion, transfusion and therapeutic injection, initial encounter: Secondary | ICD-10-CM | POA: Diagnosis not present

## 2023-03-13 MED ORDER — AMOXICILLIN 250 MG/5ML PO SUSR: 750.0000 mg | Freq: Three times a day (TID) | ORAL | 1 refills | Status: DC

## 2023-03-13 MED ORDER — ONDANSETRON 4 MG PO TBDP: 4.0000 mg | ORAL_TABLET | Freq: Three times a day (TID) | ORAL | 1 refills | Status: DC | PRN

## 2023-03-13 NOTE — Patient Instructions (Signed)
Stop the linezolid today   Start oral liquid antibiotics amoxicillin 750 mg three times a day   At 3 months will discuss if your wound is healing very well if we can go off antibiotics. My goal is to be at 6 months   See me 4 weeks   Zofran as needed for nausea (try to take it half an hour before your first dose amoxicillin for the day to prevent nausea)

## 2023-03-13 NOTE — Progress Notes (Signed)
Right knee x-ray shows a bone spur in the inside or medial part of the knee that could have been broken off.  How is that knee feeling now after the injection?

## 2023-03-13 NOTE — Progress Notes (Signed)
Patient Active Problem List   Diagnosis Date Noted   Morbid obesity (HCC) 02/18/2023   Medication management 02/13/2023   Penicillin allergy 02/13/2023   Wound of left lower extremity 02/06/2023   S/P debridement 02/06/2023   Dysuria 01/31/2023   Encounter for palliative care involving management of pain 01/28/2023   Acute post-traumatic wound infection 01/27/2023   Nasal congestion 01/16/2023   Deficiency anemia 01/15/2023   Need for immunization against influenza 01/15/2023   Diabetes mellitus (HCC) 12/19/2022   Ventricular fibrillation (HCC) 12/10/2022   Onychomycosis 11/15/2022   Anxiety 10/27/2022   Rosacea, acne 08/13/2022   Primary osteoarthritis of both knees 03/06/2022   Bilateral ovarian cysts 03/06/2022   Major depressive disorder, recurrent episode, severe with anxious distress (HCC) 08/09/2021   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   Need for varicella vaccine 08/09/2021   Urge incontinence 08/01/2021   Tinea corporis 03/14/2021   Hyperlipidemia LDL goal <100 03/01/2021   DDD (degenerative disc disease), lumbar 08/08/2020   Non-seasonal allergic rhinitis due to pollen 07/25/2020   Chronic idiopathic constipation 07/25/2020   Visit for screening mammogram 02/11/2020   Chronic bilateral low back pain without sciatica 02/10/2020   Spondylosis without myelopathy or radiculopathy, lumbar region 12/04/2017   Type 2 diabetes mellitus with hyperglycemia (HCC)    Normal coronary arteries 04/17/2016   OSA on CPAP 10/28/2014   Chronic combined systolic and diastolic heart failure (HCC) 12/06/2013   Hyperlipidemia associated with type 2 diabetes mellitus (HCC), on Zocor 04/04/2011   Hypertension associated with diabetes (HCC) 06/03/2008   Morbid obesity with BMI of 50.0-59.9, adult (HCC) 07/12/2006   NICM (nonischemic cardiomyopathy) (HCC) 07/12/2006   Migraine without status migrainosus, not intractable 07/12/2006    Patient's  Medications  New Prescriptions   No medications on file  Previous Medications   ACETAMINOPHEN (TYLENOL) 325 MG TABLET    Take 1-2 tablets (325-650 mg total) by mouth every 4 (four) hours as needed for mild pain (pain score 1-3).   ATORVASTATIN (LIPITOR) 80 MG TABLET    Take 1 tablet (80 mg total) by mouth daily.   BLOOD GLUCOSE MONITORING SUPPL DEVI    1 each by Does not apply route in the morning, at noon, and at bedtime. May substitute to any manufacturer covered by patient's insurance.   CARVEDILOL (COREG) 25 MG TABLET    Take 25 mg by mouth 2 (two) times daily.   COLLAGENASE (SANTYL) 250 UNIT/GM OINTMENT    Apply 1 Application topically daily.   DAPAGLIFLOZIN PROPANEDIOL (FARXIGA) 10 MG TABS TABLET    Take 1 tablet (10 mg total) by mouth daily.   FLUTICASONE (FLONASE) 50 MCG/ACT NASAL SPRAY    Place 2 sprays into both nostrils daily.   FUROSEMIDE (LASIX) 40 MG TABLET    Take 1 tablet (40 mg total) by mouth daily.   GLUCOSE BLOOD (BLOOD GLUCOSE TEST STRIPS) STRP    1 each by In Vitro route in the morning, at noon, and at bedtime. May substitute to any manufacturer covered by patient's insurance.   HYDROXYZINE (ATARAX) 25 MG TABLET    Take 1 tablet (25 mg total) by mouth 3 (three) times daily as needed.   KETOCONAZOLE (NIZORAL) 2 % CREAM    Apply 1 Application topically daily. Apply 1gm to skin on plantar aspect of affected foot, and massage in well.  Apply once daily.   LINEZOLID (ZYVOX) 600 MG TABLET    Take 1 tablet (600 mg total) by mouth 2 (  two) times daily.   LORAZEPAM (ATIVAN) 0.5 MG TABLET    Take 1 tablet (0.5 mg total) by mouth 2 (two) times daily as needed for anxiety.   LOSARTAN (COZAAR) 25 MG TABLET    Take 0.5 tablets (12.5 mg total) by mouth daily.   MAGNESIUM OXIDE (MAG-OX) 400 MG TABLET    Take 1 tablet (400 mg total) by mouth at bedtime.   METFORMIN (GLUCOPHAGE-XR) 500 MG 24 HR TABLET    Take 1 tablet (500 mg total) by mouth 2 (two) times daily with a meal.   METOPROLOL  SUCCINATE (TOPROL-XL) 50 MG 24 HR TABLET    Take 1 tablet (50 mg total) by mouth daily. Take with or immediately following a meal.   MULTIPLE VITAMIN (MULTIVITAMIN WITH MINERALS) TABS TABLET    Take 1 tablet by mouth daily.   NON FORMULARY    Pt uses cpap nightly   NUTRITION SUPPLEMENT, JUVEN, (JUVEN) PACK    Take 1 packet by mouth 2 (two) times daily between meals.   OXYCODONE HCL 10 MG TABS    Take 1 tablet (10 mg total) by mouth 3 (three) times daily as needed.   POLYETHYLENE GLYCOL POWDER (GLYCOLAX/MIRALAX) 17 GM/SCOOP POWDER    Take 1 capful (17 g) in water 2 (two) times daily.   POTASSIUM CHLORIDE SA (KLOR-CON M) 20 MEQ TABLET    Take 1 tablet (20 mEq total) by mouth daily.   SENNA-DOCUSATE (SENOKOT-S) 8.6-50 MG TABLET    Take 2 tablets by mouth at bedtime.   SPIRONOLACTONE (ALDACTONE) 25 MG TABLET    Take 0.5 tablets (12.5 mg total) by mouth daily.   TOPIRAMATE (TOPAMAX) 25 MG TABLET    Take 1 tablet (25 mg total) by mouth at bedtime.  Modified Medications   No medications on file  Discontinued Medications   No medications on file    Subjective: 58 yo female hx advance heart failure, dm2, recent prolonged admission 12/05/22-12/30/22 for vf cardiac arrest and left knee IO catheter that complicated with chronic wound here for f/u wound infection  03/13/23 id clinic f/u Reviewed allergy progress note --> pcn testing shows that she doesn't have pcn allergy Complains of nausea/heart burn with abx Leg wound improving per wound care but she still has lots of pain No diarrhea/rash otherwise   ------------- 02/28/23 id initial clinic visit with me I reviewed note from her last visit with dr Elinor Parkinson Pcn allergy -- urgent referral for allergy immunology in setting of actinomyces species on wound cx. Saw dr Murrell Converse 02/27/23 and planned pcn testing/desensitization soon Ortho/plastic seeing patient  01/27/23 seen by pcp and there is a ?swab wound cx that grew pseudomonas (cipro mic 0.5 sensitive;  levo 2 resistant) 12/12 I&D of the wound by plastic -- myriad tissue matrix placed over exposed tendon. Operative cx e faecalis, actinomyces species and actinomyces europea Patient had 2 round of ciprofloxacin 12/5 and 12/13; no side effect.  She had another I&D by plastic surgery 12/30.   Patient currently taking linezolid only and had finished cipro  I have no imaging for patient. Given the IO catheter use I worry there could be OM   I also reviewed operative note from 12/12 and 02/24/23 of dr Weyman Croon of plastic surgery 12/12: Didn't appear the debridement needs to go to the bone. Tendon exposed as mentioned above 12/30: Tissue healing but patella tendon remains exposed. I&D done and more tissue matrix placed over the tendon. No operative culture sent  She reports sharp/shooting  pain in the area. No f/c. No n/v/d. No blurry vision. No numbness/tingling Will see plastic again in a few days  Doesn't have desensitization until 1/27  Also wears life vest now waiting for icd placement which await clearance of Infection   Review of Systems: all systems reviewed and negative  Past Medical History:  Diagnosis Date   ADD (attention deficit disorder)    Anxiety    Arthritis    both knees right worse than left   Carpal tunnel syndrome of right wrist    Depression    Essential hypertension, benign    Gallstones    History of smoking 06/22/2016   Hyperlipidemia associated with type 2 diabetes mellitus (HCC), on Zocor 04/04/2011   Hypertension associated with diabetes (HCC) 06/03/2008   Migraines    Morbid obesity (HCC)    Morbid obesity with BMI of 50.0-59.9, adult (HCC) 07/12/2006   NICM (nonischemic cardiomyopathy) (HCC) 07/12/2006   01/16/18 ECHO:    - Procedure narrative: Transthoracic echocardiography. Image   quality was suboptimal. The study was technically difficult.   Intravenous contrast (Definity) was administered. - Left ventricle: The cavity size was moderately  dilated. Wall   thickness was increased in a pattern of mild LVH. Systolic   function was moderately to severely reduced. The estimated   ejection fraction w   Normal coronary arteries 04/17/2016   OSA on CPAP 10/28/2014   Spinal stenosis    back pain   Spondylosis without myelopathy or radiculopathy, lumbar region 12/04/2017   Type 2 diabetes mellitus with hyperglycemia [facility])    Past Surgical History:  Procedure Laterality Date   CHOLECYSTECTOMY     DILATATION & CURETTAGE/HYSTEROSCOPY WITH MYOSURE N/A 10/31/2017   Procedure: DILATATION & CURETTAGE/HYSTEROSCOPY WITH MYOSURE;  Surgeon: Romualdo Bolk, MD;  Location: [facility] OR[facility];  Service: Gynecology;  Laterality: N/A;   HYSTEROSCOPY WITH D & C N/A 05/07/2022   Procedure: DILATATION AND CURETTAGE /HYSTEROSCOPY;  Surgeon: [name], MD;  Location: [facility] OR;  Service: Gynecology;  Laterality: N/A;   INCISION AND DRAINAGE OF WOUND Left 02/06/2023   Procedure: debridement of left leg wound, application of myriad;  Surgeon: [name], MD;  Location: [facility] OR;  Service: Plastics;  Laterality: Left;   INCISION AND DRAINAGE OF WOUND Left 02/24/2023   Procedure: application of tissue replacement matrix of left patellar tendon;  Surgeon: [name], MD;  Location: [facility] OR;  Service: Plastics;  Laterality: Left;   IRRIGATION AND DEBRIDEMENT KNEE  02/24/2023   Procedure: IRRIGATION AND DEBRIDEMENT KNEE;  Surgeon: [name], MD;  Location: [facility] OR;  Service: Plastics;;   life vest     RIGHT/LEFT HEART CATH AND CORONARY ANGIOGRAPHY N/A 04/11/2016   Procedure: Right/Left Heart Cath and Coronary Angiography;  Surgeon: Kathleene Hazel, MD;  Location: [facility] INVASIVE CV LAB;  Service: Cardiovascular;  Laterality: N/A;   RIGHT/LEFT HEART CATH AND CORONARY ANGIOGRAPHY N/A 12/09/2022   Procedure: RIGHT/LEFT HEART CATH AND CORONARY ANGIOGRAPHY;  Surgeon: Yates Decamp, MD;  Location: [facility] INVASIVE CV LAB;  Service: Cardiovascular;  Laterality:  N/A;   sonogram for blood clots     no blockages     Social History   Tobacco Use   Smoking status: Former    Current packs/day: 0.00    Types: Cigarettes    Quit date: 02/25/1997    Years since quitting: 26.0   Smokeless tobacco: Never   Tobacco comments:    Married, lives with spouse (when he is not traveling)  and son  Vaping Use   Vaping status: Never Used  Substance Use Topics   Alcohol use: No   Drug use: No    Family History  Problem Relation Age of Onset   Depression Mother    Anxiety disorder Mother    Diabetes Mother    Hypertension Mother    ADD / ADHD Father    Diabetes Father    Hypertension Father    Colitis Father    Hypertension Other    Diabetes Other    Colitis Other    Alcohol abuse Other     Allergies  Allergen Reactions   Fluoxetine Other (See Comments)    More depressed   Cymbalta [Duloxetine Hcl] Other (See Comments)    depressed   Pork-Derived Products     Health Maintenance  Topic Date Due   COVID-19 Vaccine (4 - 2024-25 season) 10/27/2022   FOOT EXAM  03/07/2023   Zoster Vaccines- Shingrix (1 of 2) 04/18/2023 (Originally [birth year]1984)   HEMOGLOBIN A1C  06/07/2023   MAMMOGRAM  07/09/2023   OPHTHALMOLOGY EXAM  08/09/2023   Colonoscopy  02/11/2026   Cervical Cancer Screening (HPV/Pap Cotest)  08/02/2026   DTaP/Tdap/Td (6 - Td or Tdap) 11/08/2027   Pneumococcal Vaccine 22-5 Years old  Completed   INFLUENZA VACCINE  Completed   Hepatitis C Screening  Completed   HIV Screening  Completed   HPV VACCINES  Aged Out    Objective: LMP  (LMP Unknown)   Physical Exam Constitutional:      Appearance: Normal appearance. Morbidly obese  HENT:     Head: Normocephalic and atraumatic.      Mouth: Mucous membranes are moist.  Eyes:    Conjunctiva/sclera: Conjunctivae normal.     Pupils: Pupils are equal, round, and b/l symmetrical   Cardiovascular:     Rate and Rhythm: Normal rate and regular rhythm.     Heart sounds: s1s2. She has a  life vest. Skin iritation at the left lateral breast due to life vest, no cellulitis or signs of infection  Pulmonary:     Effort: Pulmonary effort is normal.     Breath sounds: Normal breath sounds.   Abdominal:     General: Non distended     Palpations: soft.   Musculoskeletal:        General: sitting in the wheelchair  Skin: 02/28/23 picture from 12/30    03/10/23 photo   Lab Results Lab Results  Component Value Date   WBC 8.9 02/28/2023   HGB 14.1 02/28/2023   HCT 44.5 02/28/2023   MCV 85.2 02/28/2023   PLT 284 02/28/2023    Lab Results  Component Value Date   CREATININE 1.01 02/28/2023   BUN 21 02/28/2023   NA 140 02/28/2023   K 4.1 02/28/2023   CL 100 02/28/2023   CO2 24 02/28/2023    Lab Results  Component Value Date   ALT 30 (H) 02/28/2023   AST 26 02/28/2023   ALKPHOS 42 12/18/2022   BILITOT 1.1 02/28/2023    Lab Results  Component Value Date   CHOL 194 08/13/2022   HDL 48.90 08/13/2022   LDLCALC 117 (H) 08/13/2022   LDLDIRECT 143.8 04/03/2011   TRIG 208 (H) 12/06/2022   CHOLHDL 4 08/13/2022      Component Value Date/Time   CRP 19.3 (H) 02/28/2023 1139   CRP 28.6 (H) 02/13/2023 0257   CRP 2.8 (H) 12/16/2022 1820     Imaging: Reviewed  11/2022 tte  1.  Left ventricular ejection fraction, by estimation, is 30 to 35%. The  left ventricle has moderately decreased function. The left ventricle  demonstrates global hypokinesis. There is mild left ventricular  hypertrophy. Indeterminate diastolic filling due   to E-A fusion.   2. Right ventricular systolic function is normal. The right ventricular  size is normal.   3. The mitral valve is normal in structure. Trivial mitral valve  regurgitation.   4. The aortic valve is tricuspid. Aortic valve regurgitation is not  visualized.   5. The inferior vena cava IVC not well visualized.   Comparison(s): No significant change from prior study.       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  Culture: -01/27/23 is a clinic superficial swab --> pseudomonas (S cipro; R levo) -- I do not know the significance of this superficial cx (open wound is often colonized and pseudomonas usually do not play a pathogenic role) -12/12 I&D culture E faecalis, Actinomyces spp, Actinomyces europaea.   Tx course: 12/12 I&D and matrix application to tendon 12/30 another matrix application -- wound looks healthy otherwise Abx: Finished 2 courses of cipro 7 day at a time by 12/17 Zyvox started 12/19   -continue zyvox for now in setting of actinomyces/e faecalis (the latter might not be pathogenic either). Doxy possible if needs bridging but want to start amoxicillin -surgery the heavy lifting has been done but would like to treat with amoxicillin for at least 3 months in setting actinomyces found -labs today -f/u 2 weeks -chart sent to dr Wyline Mood requesting sooner pcn testing/desensitization if needed   # Penicillin allergy  Saw allergy and planned desensitization/testing in a few weeks  -would like to see if this can be done sooner say in 1-2 weeks and I'll send message to dr Wyline Mood today (so ICD can be considered and less toxic antibiotics can be used)     # Chronic HFrEF/NICM, VF Cardiac arrest  Fu with cardiology  Plan for ICD once left leg wound heals  -from id standpoint today, agree healed wound would be least risky given portal of entry. But if more urgent icd placement needed, I am ok with placing it once she is on amoxicillin (this would make it easier for hospital admission to transition to iv penicillin, and also by the time we transition her to penicillin, will know for sure the insignificance of pseudomonas found on superficial tissue culture   03/13/23 id clinic assessment She still wears life vest The wound looks clean from  1/13 and healthy granulating tissue No evidence pcn allergy based on oral challenge and skin prick test by allergy clinic  Stop zyvox today Start amox 750 mg tid; plan at least 3-6 months  She doesn't want iv abx  F/u 4 weeks for labs  F/u plastic surgery   Raymondo Band, MD [facility] 03/13/2023, 1:50 PM

## 2023-03-14 ENCOUNTER — Encounter: Payer: Self-pay | Admitting: Physician Assistant

## 2023-03-14 ENCOUNTER — Ambulatory Visit (INDEPENDENT_AMBULATORY_CARE_PROVIDER_SITE_OTHER): Payer: Medicare Other | Admitting: Physician Assistant

## 2023-03-14 ENCOUNTER — Encounter (HOSPITAL_COMMUNITY): Payer: Self-pay

## 2023-03-14 VITALS — BP 123/83 | HR 99 | Resp 20 | Ht 64.0 in

## 2023-03-14 DIAGNOSIS — G8929 Other chronic pain: Secondary | ICD-10-CM | POA: Diagnosis not present

## 2023-03-14 DIAGNOSIS — R413 Other amnesia: Secondary | ICD-10-CM | POA: Insufficient documentation

## 2023-03-14 NOTE — Patient Instructions (Signed)
It was a pleasure to see you today at our office.   Recommendations:   MRI of the brain, the radiology office will call you to arrange you appointment   Replenish B12  Follow up in 4-5 months    I would recommend psychiatry.  You don't need a referral.  You can contact some of these resources to see if they are accepting new patients and to see if they accept your insurance:  [email or web address]  [facility]   Dr. Ardeth Sportsman - [phone] [facility]) - [phone] [facility] [place]) - [phone] Dr. Milagros Evener [facility]) [phone] [facility]) [phone] [facility]) - [phone] [facility]) [phone] [facility], [address], [place], [place] [phone] Dr. Dionisio David (neuropsychiatry); Franchot Erichsen; [facility]; 9th Floor; [place], [place] [phone] Dr. Neysa Hotter; [facility]; [address]; [place], [place] [address]  [phone]   For psychiatric meds, mood meds: Please have your primary care physician manage these medications.  If you have any severe symptoms of a stroke, or other severe issues such as confusion,severe chills or fever, etc call 911 or go to the ER as you may need to be evaluated further   For guidance regarding [facility] and if placement were needed at the facility, contact Social Worker tel: [phone]  For assessment of decision of mental capacity and competency:  Call Dr. Erick Blinks, geriatric psychiatrist at [phone]  Counseling regarding caregiver distress, including caregiver depression, anxiety and issues regarding community resources, adult day care programs, adult living facilities, or memory care questions:  please contact your  Primary  Doctor's Social Worker   Whom to call: Memory  decline, memory medications: Call our office [phone]    [email or web address]   RECOMMENDATIONS FOR ALL PATIENTS WITH MEMORY PROBLEMS: 1. Continue to exercise (Recommend 30 minutes of walking everyday, or 3 hours every week) 2. Increase social interactions - continue going to [place] and enjoy social gatherings with friends and family 3. Eat healthy, avoid fried foods and eat more fruits and vegetables 4. Maintain adequate blood pressure, blood sugar, and blood cholesterol level. Reducing the risk of stroke and cardiovascular disease also helps promoting better memory. 5. Avoid stressful situations. Live a simple life and avoid aggravations. Organize your time and prepare for the next day in anticipation. 6. Sleep well, avoid any interruptions of sleep and avoid any distractions in the bedroom that may interfere with adequate sleep quality 7. Avoid sugar, avoid sweets as there is a strong link between excessive sugar intake, diabetes, and cognitive impairment We discussed the Mediterranean diet, which has been shown to help patients reduce the risk of progressive memory disorders and reduces cardiovascular risk. This includes eating fish, eat fruits and green leafy vegetables, nuts like almonds and hazelnuts, walnuts, and also use olive oil. Avoid fast foods and fried foods as much as possible. Avoid sweets and sugar as sugar use has been linked to worsening of memory function.  There is always a concern of gradual progression of memory problems. If this is the case, then we may need to adjust level of care according to patient needs. Support, both to the patient and caregiver, should then be put into place.      You have been referred for a neuropsychological evaluation (i.e., evaluation of memory and thinking abilities). Please bring someone with you to this appointment if possible, as it  is helpful for the doctor to hear from both you and another adult who knows you well. Please bring eyeglasses and hearing aids if you wear them.    The evaluation will take approximately 3 hours and has two parts:   The first part is a clinical interview with the neuropsychologist (Dr. Milbert Coulter or Dr. Roseanne Reno). During the interview, the neuropsychologist will speak with you and the individual you brought to the appointment.    The second part of the evaluation is testing with the doctor's technician Annabelle Harman or Selena Batten). During the testing, the technician will ask you to remember different types of material, solve problems, and answer some questionnaires. Your family member will not be present for this portion of the evaluation.   Please note: We must reserve several hours of the neuropsychologist's time and the psychometrician's time for your evaluation appointment. As such, there is a No-Show fee of $100. If you are unable to attend any of your appointments, please contact our office as soon as possible to reschedule.      DRIVING: Regarding driving, in patients with progressive memory problems, driving will be impaired. We advise to have someone else do the driving if trouble finding directions or if minor accidents are reported. Independent driving assessment is available to determine safety of driving.   If you are interested in the driving assessment, you can contact the following:  [employer] in [place] [phone]  [facility] [phone]  [facility] [phone]  [facility] [phone] or [phone]   FALL PRECAUTIONS: Be cautious when walking. Scan the area for obstacles that may increase the risk of trips and falls. When getting up in the mornings, sit up at the edge of the bed for a few minutes before getting out of bed. Consider elevating the bed at the head end to avoid drop of blood pressure when getting up. Walk always in a  well-lit room (use night lights in the walls). Avoid area rugs or power cords from appliances in the middle of the walkways. Use a walker or a cane if necessary and consider physical therapy for balance exercise. Get your eyesight checked regularly.  FINANCIAL OVERSIGHT: Supervision, especially oversight when making financial decisions or transactions is also recommended.  HOME SAFETY: Consider the safety of the kitchen when operating appliances like stoves, microwave oven, and blender. Consider having supervision and share cooking responsibilities until no longer able to participate in those. Accidents with firearms and other hazards in the house should be identified and addressed as well.   ABILITY TO BE LEFT ALONE: If patient is unable to contact 911 operator, consider using LifeLine, or when the need is there, arrange for someone to stay with patients. Smoking is a fire hazard, consider supervision or cessation. Risk of wandering should be assessed by caregiver and if detected at any point, supervision and safe proof recommendations should be instituted.  MEDICATION SUPERVISION: Inability to self-administer medication needs to be constantly addressed. Implement a mechanism to ensure safe administration of the medications.      Mediterranean Diet A Mediterranean diet refers to food and lifestyle choices that are based on the traditions of countries located on the [employer]. This way of eating has been shown to help prevent certain conditions and improve outcomes for people who have chronic diseases, like kidney disease and heart disease. What are tips for following this plan? Lifestyle  Cook and eat meals together with your family, when possible. Drink enough fluid to keep your  urine clear or pale yellow. Be physically active every day. This includes: Aerobic exercise like running or swimming. Leisure activities like gardening, walking, or housework. Get 7-8 hours of sleep each  night. If recommended by your health care provider, drink red wine in moderation. This means 1 glass a day for nonpregnant women and 2 glasses a day for men. A glass of wine equals 5 oz (150 mL). Reading food labels  Check the serving size of packaged foods. For foods such as rice and pasta, the serving size refers to the amount of cooked product, not dry. Check the total fat in packaged foods. Avoid foods that have saturated fat or trans fats. Check the ingredients list for added sugars, such as corn syrup. Shopping  At the grocery store, buy most of your food from the areas near the walls of the store. This includes: Fresh fruits and vegetables (produce). Grains, beans, nuts, and seeds. Some of these may be available in unpackaged forms or large amounts (in bulk). Fresh seafood. Poultry and eggs. Low-fat dairy products. Buy whole ingredients instead of prepackaged foods. Buy fresh fruits and vegetables in-season from local farmers markets. Buy frozen fruits and vegetables in resealable bags. If you do not have access to quality fresh seafood, buy precooked frozen shrimp or canned fish, such as tuna, salmon, or sardines. Buy small amounts of raw or cooked vegetables, salads, or olives from the deli or salad bar at your store. Stock your pantry so you always have certain foods on hand, such as olive oil, canned tuna, canned tomatoes, rice, pasta, and beans. Cooking  Cook foods with extra-virgin olive oil instead of using butter or other vegetable oils. Have meat as a side dish, and have vegetables or grains as your main dish. This means having meat in small portions or adding small amounts of meat to foods like pasta or stew. Use beans or vegetables instead of meat in common dishes like chili or lasagna. Experiment with different cooking methods. Try roasting or broiling vegetables instead of steaming or sauteing them. Add frozen vegetables to soups, stews, pasta, or rice. Add nuts or seeds  for added healthy fat at each meal. You can add these to yogurt, salads, or vegetable dishes. Marinate fish or vegetables using olive oil, lemon juice, garlic, and fresh herbs. Meal planning  Plan to eat 1 vegetarian meal one day each week. Try to work up to 2 vegetarian meals, if possible. Eat seafood 2 or more times a week. Have healthy snacks readily available, such as: Vegetable sticks with hummus. Greek yogurt. Fruit and nut trail mix. Eat balanced meals throughout the week. This includes: Fruit: 2-3 servings a day Vegetables: 4-5 servings a day Low-fat dairy: 2 servings a day Fish, poultry, or lean meat: 1 serving a day Beans and legumes: 2 or more servings a week Nuts and seeds: 1-2 servings a day Whole grains: 6-8 servings a day Extra-virgin olive oil: 3-4 servings a day Limit red meat and sweets to only a few servings a month What are my food choices? Mediterranean diet Recommended Grains: Whole-grain pasta. Brown rice. Bulgar wheat. Polenta. Couscous. Whole-wheat bread. Orpah Cobb. Vegetables: Artichokes. Beets. Broccoli. Cabbage. Carrots. Eggplant. Green beans. Chard. Kale. Spinach. Onions. Leeks. Peas. Squash. Tomatoes. Peppers. Radishes. Fruits: Apples. Apricots. Avocado. Berries. Bananas. Cherries. Dates. Figs. Grapes. Lemons. Melon. Oranges. Peaches. Plums. Pomegranate. Meats and other protein foods: Beans. Almonds. Sunflower seeds. Pine nuts. Peanuts. Cod. Salmon. Scallops. Shrimp. Tuna. Tilapia. Clams. Oysters. Eggs. Dairy: Low-fat milk. Cheese.  Greek yogurt. Beverages: Water. Red wine. Herbal tea. Fats and oils: Extra virgin olive oil. Avocado oil. Grape seed oil. Sweets and desserts: [place] yogurt with honey. Baked apples. Poached pears. Trail mix. Seasoning and other foods: Basil. Cilantro. Coriander. Cumin. Mint. Parsley. Sage. Rosemary. Tarragon. Garlic. Oregano. Thyme. Pepper. Balsalmic vinegar. Tahini. Hummus. Tomato sauce. Olives. Mushrooms. Limit  these Grains: Prepackaged pasta or rice dishes. Prepackaged cereal with added sugar. Vegetables: Deep fried potatoes (french fries). Fruits: Fruit canned in syrup. Meats and other protein foods: Beef. Pork. Lamb. Poultry with skin. Hot dogs. Tomasa Blase. Dairy: Ice cream. Sour cream. Whole milk. Beverages: Juice. Sugar-sweetened soft drinks. Beer. Liquor and spirits. Fats and oils: Butter. Canola oil. Vegetable oil. Beef fat (tallow). Lard. Sweets and desserts: Cookies. Cakes. Pies. Candy. Seasoning and other foods: Mayonnaise. Premade sauces and marinades. The items listed may not be a complete list. Talk with your dietitian about what dietary choices are right for you. Summary The Mediterranean diet includes both food and lifestyle choices. Eat a variety of fresh fruits and vegetables, beans, nuts, seeds, and whole grains. Limit the amount of red meat and sweets that you eat. Talk with your health care provider about whether it is safe for you to drink red wine in moderation. This means 1 glass a day for nonpregnant women and 2 glasses a day for men. A glass of wine equals 5 oz (150 mL). This information is not intended to replace advice given to you by your health care provider. Make sure you discuss any questions you have with your health care provider. Document Released: 10/05/2015 Document Revised: 11/07/2015 Document Reviewed: 10/05/2015 Elsevier Interactive Patient Education  2017 [employer].

## 2023-03-17 ENCOUNTER — Telehealth: Payer: Self-pay

## 2023-03-17 ENCOUNTER — Ambulatory Visit: Payer: Medicare Other | Admitting: Student

## 2023-03-17 VITALS — BP 118/88 | HR 93 | Wt 253.6 lb

## 2023-03-17 DIAGNOSIS — S81802D Unspecified open wound, left lower leg, subsequent encounter: Secondary | ICD-10-CM | POA: Diagnosis not present

## 2023-03-17 NOTE — Telephone Encounter (Signed)
Called [facility] and spoke with Everrett Coombe, RN (patient's home health nurse) to get update on her not having adaptic to change patient's dressings. She stated she has reached out to [employer] and to their rep to resolve their supply issue. She stated Magdeline is not her only patient she needs adaptic for so she is addressing the matter with urgency. She stated she would call the patient when she heard back from their rep/[employer] by the end of the day. Alan Ripper, [place] made aware of response.

## 2023-03-17 NOTE — Progress Notes (Signed)
Referring Provider [name], MD [address] [place],  [place] [address]   CC:  Chief Complaint  Patient presents with   post op      [name] is an 58 y.o. female.  HPI: Patient is a 58 year old female with history of a large wound to her left lower extremity. She underwent debridement of the left lower extremity wound with placement of myriad tissue matrix with Dr. Ladona Ridgel on 02/06/2023 initially. She then underwent another wound debridement and placement of tissue replacement matrix with Dr. Ladona Ridgel on 02/24/2023. She presents to the clinic today for postoperative follow-up.   Patient was last seen in the clinic on 03/10/2023.  At this visit, patient was doing okay.  On exam, Sorbact was removed.  There was unincorporated area to the superior aspect of the wound and some granulation tissue noted just superior of the tendon as well as surrounding the tendon.  The tendon itself appeared to have some unincorporated myriad over it.  The inferior aspect of the wound appeared to have healthy appearing granulation tissue.  Today, patient is accompanied by her husband at bedside.  She denies any new issues or concerns since I saw her in the clinic last week.  Her husband does state though that the myriad over the tendon came off over the weekend.  Patient denies any fevers.  She states that she is still having pain, still has not yet seen her PCP for pain management referral.    Patient was also inquiring about going on a trip to [place].  Review of Systems General: Denies any fevers or chills  Physical Exam    03/17/2023   12:48 PM 03/14/2023    1:32 PM 03/13/2023    1:53 PM  Vitals with BMI  Height  5\' 4"  5\' 4"   Weight 253 lbs 10 oz  257 lbs  BMI 43.51  44.09  Systolic 118 123 308  Diastolic 88 83 83  Pulse 93 99 84    General:  No acute distress,  Alert and oriented, Non-Toxic, Normal speech and affect On exam, patient is sitting upright in no acute distress.  Tendon  appears to be exposed centrally to the wound.  It appears unincorporated myriad has sloughed off and is surrounding the tendon.  To the superior aspect of the wound, there is some unincorporated myriad.  Surrounding the tendon, there is healthy appearing granulation tissue.  There is also some healthy appearing granulation tissue along with unincorporated myriad versus exudate at the inferior aspect of the wound.  There are no overt signs of infection.  No surrounding erythema, no drainage.  Assessment/Plan  Wound of left lower extremity, subsequent encounter - Plan: Ambulatory referral to Pain Clinic   Discussed with the patient and her husband it appears [name] has come off of the tendon.  Discussed with them the importance of keeping the tendon covered with Adaptic and covering with K-Y jelly daily and to the remainder of the wound.  Patient to continue dressing changes as she has been.  Patient and patient's husband expressed understanding.  Will try and send a pain management referral to [facility] to see if patient can get in for pain management that way.  I did discuss with the patient that I do not think that it is a good idea for her to go on a trip to [place] given her physical condition and the appearance of her wound.  I also did discuss with her the  risk of developing blood clots and traveling in her physical state.  Patient expressed understanding.  Plan will be for patient to come back next week for reevaluation.  I instructed her to call in the meantime she has any questions or concerns about anything.  Pictures were obtained of the patient and placed in the chart with the patient's or guardian's permission.   [name] 03/17/2023, 2:57 PM

## 2023-03-18 ENCOUNTER — Ambulatory Visit (INDEPENDENT_AMBULATORY_CARE_PROVIDER_SITE_OTHER): Payer: Medicare Other | Admitting: Mental Health

## 2023-03-18 ENCOUNTER — Ambulatory Visit: Payer: Self-pay | Admitting: Licensed Clinical Social Worker

## 2023-03-18 DIAGNOSIS — F332 Major depressive disorder, recurrent severe without psychotic features: Secondary | ICD-10-CM

## 2023-03-18 DIAGNOSIS — F419 Anxiety disorder, unspecified: Secondary | ICD-10-CM

## 2023-03-18 NOTE — Progress Notes (Signed)
Care Guide Pharmacy Note  03/18/2023 Name: Caress Giacomini MRN: [id number] DOB: [birth year]  Referred By: [name], MD Reason for referral: Care Coordination (Outreach to reschedule with Pharm d )   Tytana Singleterry is a 58 y.o. year old female who is a primary care patient of [name], MD.  [name] was referred to the pharmacist for assistance related to: HTN, HLD, and DMII  Successful contact was made with the patient to discuss pharmacy services including being ready for the pharmacist to call at least 5 minutes before the scheduled appointment time and to have medication bottles and any blood pressure readings ready for review. The patient agreed to meet with the pharmacist via telephone visit on (date/time).04/02/2023  Penne Lash , RMA       [facility] Guide  Direct Dial: [phone]  Website: [email or web address]

## 2023-03-18 NOTE — Patient Outreach (Signed)
Care Coordination   Follow Up Visit Note   03/18/2023 Name: [name] MRN: [id number] DOB: [birth year]  Camya Cerutti is a 58 y.o. year old female who sees [name], MD for primary care. I spoke with  [name] by phone today.  What matters to the patients health and wellness today?  Patient sees counselor for mental health support    Goals Addressed             This Visit's Progress    Patient sees counselor for mental health support       Interventions:  Spoke with client via phone today about client needs Client said she sees counselor for mental health support. She has next appointment with counselor in March of 2025. Counselor she sees is at [facility]  Client is taking medications as prescribed. She spoke of ongoing anxiety and stress issues. She has medical needs and is having wound care in her home 2 times weekly Client spoke of client talking with PCP about cushion for her to use. She said she has small breakdown area on buttocks. She thinks she would benefit from a wheelchair cushion or some type of cushion to use to help with skin care.  Talked with client about program services with RN, LCSW, Pharmacist Client spoke of issues with her smell. She said she can smell foods when they are cooked and sometimes smells can make her feel nauseous Discussed panic episodes or panic experiences.  She has panic symptoms like agitation, teeth clinched, nauseous. Talked about client support from her spouse Client spoke of heart attack  on October 10,2024 and treatment received for her heart attack.  Client sees cardiologist for support Client spoke of mental health issues and her trying to take medication to help with mental health issues Thanked client for phone call with LCSW today Encouraged client to call LCSW as needed for SW support. Client has phone number for LCSW Client was very appreciative of call from LCSW today           SDOH assessments  and interventions completed:  Yes  SDOH Interventions Today    Flowsheet Row Most Recent Value  SDOH Interventions   Depression Interventions/Treatment  Medication  Physical Activity Interventions Other (Comments)  [needs help with ambulation,  uses a cane to help her walk, at risk for falls,  Has a walker to use as needed]  Stress Interventions Other (Comment)  [stress in managing medical needs,  stress in managing mental health needs]        Care Coordination Interventions:  Yes, provided   Interventions Today    Flowsheet Row Most Recent Value  Chronic Disease   Chronic disease during today's visit Other  [spoke with client about client needs]  General Interventions   General Interventions Discussed/Reviewed General Interventions Discussed, Community Resources  Education Interventions   Education Provided Provided Education  Provided Verbal Education On [employer]  Mental Health Interventions   Mental Health Discussed/Reviewed Coping Strategies  [client has anxiety issues and has stress issues. she is seeing counselor  for mental health support.]  Nutrition Interventions   Nutrition Discussed/Reviewed Nutrition Discussed  Pharmacy Interventions   Pharmacy Dicussed/Reviewed Pharmacy Topics Discussed  Safety Interventions   Safety Discussed/Reviewed Fall Risk        Follow up plan: Follow up call scheduled for 05/19/23 at 3:30 PM     Encounter Outcome:  Patient Visit Completed    Lorna Few  MSW, LCSW /Value Based  [facility] Licensed Clinical Social Worker Direct Dial:  [phone] Fax:  [phone] Website:  [email or web address]
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
## 2023-03-18 NOTE — Patient Instructions (Signed)
Visit Information  Thank you for taking time to visit with me today. Please don't hesitate to contact me if I can be of assistance to you.   Following are the goals we discussed today:   Goals Addressed             This Visit's Progress    Patient sees counselor for mental health support       Interventions:  Spoke with client via phone today about client needs Client said she sees counselor for mental health support. She has next appointment with counselor in March of 2025. Counselor she sees is at [facility]  Client is taking medications as prescribed. She spoke of ongoing anxiety and stress issues. She has medical needs and is having wound care in her home 2 times weekly Client spoke of client talking with PCP about cushion for her to use. She said she has small breakdown area on buttocks. She thinks she would benefit from a wheelchair cushion or some type of cushion to use to help with skin care.  Talked with client about program services with RN, LCSW, Pharmacist Client spoke of issues with her smell. She said she can smell foods when they are cooked and sometimes smells can make her feel nauseous Discussed panic episodes or panic experiences.  She has panic symptoms like agitation, teeth clinched, nauseous. Talked about client support from her spouse Client spoke of heart attack  on October 10,2024 and treatment received for her heart attack.  Client sees cardiologist for support Client spoke of mental health issues and her trying to take medication to help with mental health issues Thanked client for phone call with LCSW today Encouraged client to call LCSW as needed for SW support. Client has phone number for LCSW Client was very appreciative of call from LCSW today           Our next appointment is by telephone on 05/19/23 at 3:30 PM   Please call the care guide team at [phone] if you need to cancel or reschedule your appointment.   If you are  experiencing a Mental Health or Behavioral Health Crisis or need someone to talk to, please go to [facility] [address], [place] [phone])   The patient verbalized understanding of instructions, educational materials, and care plan provided today and DECLINED offer to receive copy of patient instructions, educational materials, and care plan.   The patient has been provided with contact information for the care management team and has been advised to call with any health related questions or concerns.    Lorna Few  MSW, LCSW [facility] Licensed Clinical Social Worker Direct Dial:  [phone] Fax:  [phone] Website:  [email or web address]

## 2023-03-19 DIAGNOSIS — I5023 Acute on chronic systolic (congestive) heart failure: Secondary | ICD-10-CM | POA: Diagnosis not present

## 2023-03-19 DIAGNOSIS — T8089XA Other complications following infusion, transfusion and therapeutic injection, initial encounter: Secondary | ICD-10-CM | POA: Diagnosis not present

## 2023-03-19 DIAGNOSIS — I152 Hypertension secondary to endocrine disorders: Secondary | ICD-10-CM | POA: Diagnosis not present

## 2023-03-19 DIAGNOSIS — I96 Gangrene, not elsewhere classified: Secondary | ICD-10-CM | POA: Diagnosis not present

## 2023-03-19 DIAGNOSIS — I5032 Chronic diastolic (congestive) heart failure: Secondary | ICD-10-CM | POA: Diagnosis not present

## 2023-03-19 DIAGNOSIS — T8189XA Other complications of procedures, not elsewhere classified, initial encounter: Secondary | ICD-10-CM | POA: Diagnosis not present

## 2023-03-20 ENCOUNTER — Telehealth: Payer: Self-pay | Admitting: Plastic Surgery

## 2023-03-20 ENCOUNTER — Encounter: Payer: Self-pay | Admitting: Family Medicine

## 2023-03-20 NOTE — Telephone Encounter (Signed)
Pt husband called about wifes left leg wound and stated it does not look good and wanted to speak with a provider about. Clinical please advise

## 2023-03-20 NOTE — Telephone Encounter (Signed)
Called the patient but her phone went to voicemail, LVM to return call. Attempted to call husband but his voicemail has not yet been set up so I was unable to leave a message.

## 2023-03-21 ENCOUNTER — Telehealth (HOSPITAL_COMMUNITY): Payer: Self-pay | Admitting: Physician Assistant

## 2023-03-21 ENCOUNTER — Other Ambulatory Visit (HOSPITAL_COMMUNITY): Payer: Self-pay | Admitting: Physician Assistant

## 2023-03-21 DIAGNOSIS — I5032 Chronic diastolic (congestive) heart failure: Secondary | ICD-10-CM | POA: Diagnosis not present

## 2023-03-21 DIAGNOSIS — I96 Gangrene, not elsewhere classified: Secondary | ICD-10-CM | POA: Diagnosis not present

## 2023-03-21 DIAGNOSIS — F419 Anxiety disorder, unspecified: Secondary | ICD-10-CM

## 2023-03-21 DIAGNOSIS — T8089XA Other complications following infusion, transfusion and therapeutic injection, initial encounter: Secondary | ICD-10-CM | POA: Diagnosis not present

## 2023-03-21 DIAGNOSIS — T8189XA Other complications of procedures, not elsewhere classified, initial encounter: Secondary | ICD-10-CM | POA: Diagnosis not present

## 2023-03-21 DIAGNOSIS — I152 Hypertension secondary to endocrine disorders: Secondary | ICD-10-CM | POA: Diagnosis not present

## 2023-03-21 DIAGNOSIS — I5023 Acute on chronic systolic (congestive) heart failure: Secondary | ICD-10-CM | POA: Diagnosis not present

## 2023-03-24 ENCOUNTER — Ambulatory Visit (INDEPENDENT_AMBULATORY_CARE_PROVIDER_SITE_OTHER): Payer: Medicare Other | Admitting: Student

## 2023-03-24 ENCOUNTER — Encounter: Payer: Self-pay | Admitting: Student

## 2023-03-24 ENCOUNTER — Ambulatory Visit: Payer: Self-pay | Admitting: Internal Medicine

## 2023-03-24 ENCOUNTER — Encounter: Payer: Medicare Other | Admitting: Allergy

## 2023-03-24 ENCOUNTER — Encounter: Payer: Medicare Other | Admitting: Student

## 2023-03-24 VITALS — BP 136/96 | HR 97 | Wt 254.0 lb

## 2023-03-24 DIAGNOSIS — S81802D Unspecified open wound, left lower leg, subsequent encounter: Secondary | ICD-10-CM | POA: Diagnosis not present

## 2023-03-24 NOTE — Progress Notes (Signed)
Referring Provider [name], MD [address] [place],  [place] [address]   CC:  Chief Complaint  Patient presents with   Post-op Follow-up      [name] is an 58 y.o. female.  HPI: Patient is a 58 year old female with history of a large wound to her left lower extremity. She underwent debridement of the left lower extremity wound with placement of myriad tissue matrix with Dr. Ladona Ridgel on 02/06/2023 initially. She then underwent another wound debridement and placement of tissue replacement matrix with Dr. Ladona Ridgel on 02/24/2023. She presents to the clinic today for postoperative follow-up.   Patient was last seen in the clinic on 03/24/2023.  At this visit, tendon was exposed centrally to the wound.  There was some unincorporated myriad which it sloughed off.  There is some surrounding healthy appearing granulation tissue.  Today, patient presents with her husband at bedside.  Patient reports she is doing okay.  She states that she still having pain to the left knee and what sounds like neuropathic pain going down her leg.  She denies any other new issues or concerns.  She does not report any infectious symptoms.  She states that she has not heard yet from [facility] for pain management.  Review of Systems General: Does not report any fevers or chills  Physical Exam    03/24/2023   10:26 AM 03/17/2023   12:48 PM 03/14/2023    1:32 PM  Vitals with BMI  Height   5\' 4"   Weight 254 lbs 253 lbs 10 oz   BMI 43.58 43.51   Systolic 136 118 119  Diastolic 96 88 83  Pulse 97 93 99    General:  No acute distress,  Alert and oriented, Non-Toxic, Normal speech and affect Patient is sitting upright in no acute distress.  Upon removal of the dressings, there is some greenish discharge noted on top of the Adaptic.  There is no surrounding erythema to the skin.  Upon removal of the Adaptic, tendon remains exposed.  There is otherwise healthy appearing granulation tissue throughout  the majority of the wound.  There is some exudate noted to some of the wound and unincorporated myriad to the superior/lateral aspect of the wound.  There was some unincorporated myriad that was removed to the medial superior corner, which provided the patient with some relief from pulling.  There is a little bit of firmness noted to the lateral superior skin consistent with some scar tissue.  There does appear to be some improved epithelialization noted at the periphery of the wound.  Assessment/Plan  Wound of left lower extremity, subsequent encounter   Dr. Ladona Ridgel also had the opportunity to evaluate the patient today.  He recommended that patient go to the operating room later this week for irrigation and debridement with placement of Integra over the tendon.  Patient and patient's husband were agreeable to this.  Recommended continue with dressing changes as previous prescribed.  Encourage patient to follow-up with her PCP for what sounds like neuropathic pain.   Message sent to patient's ID provider to see if patient can be placed on antibiotic to cover for Pseudomonas given greenish appearing drainage at today's exam to help optimize her for surgery.  Recommended that patient call [facility] to try to get an appointment with them.  Information for the clinic was sent to patient via MyChart.  Patient expressed understanding.  I also had a long discussion with the patient about  next steps and expectations.  Discussed with the patient that next step is to apply Integra with the hopes of helping granulation tissue form over the tendon.  I discussed with the patient though that there is always the possibility that at some point she may need an amputation depending on if she heals or not.  Discussed with her that this is not the plan or intention at this moment in time, but to just know that it could be a possibility at some point.  Patient expressed understanding.  Will plan to  schedule the patient for surgery later this week, and have her follow-up in the clinic next week.  I instructed her and her husband to call in the meantime if they have any questions.  Pictures were obtained of the patient and placed in the chart with the patient's or guardian's permission.   [name] 03/24/2023, 12:45 PM

## 2023-03-24 NOTE — Telephone Encounter (Signed)
Chief Complaint: Yeast infection and exacerbation of others symptoms Symptoms: Discharge and itchiness Frequency: this week Disposition: [] ED /[] Urgent Care (no appt availability in office) / [] Appointment(In office/virtual)/ []  [facility]/ [x] Home Care/ [] Refused Recommended Disposition /[] [facility]/ []  Follow-up with PCP Additional Notes: Patient called in looking to schedule appointment with her provider for ongoing health concerns. Patient transferred to nurse triage line for symptoms of pain related to open wound on her leg. Patient states she does not want to go into full specifics because Dr. Yetta Barre is fully aware of her medical condition, but wants to report a new symptom of a yeast infection due to penicillin usage. Patient states she was cleared by an allergy test to rule out Penicillin as an allergy and has started taking it, and now has a yeast infection. Advised patient on OTC treatment but patient is requesting Dr. Yetta Barre call her in prescription yeast infection medication due to the cost association. Patient is also asking if Dr. Yetta Barre can call her in vitamin c and multivitamin, as her surgeon has suggested this will help with wound healing.    Copied from CRM [id number]. Topic: Clinical - Red Word Triage >> Mar 24, 2023 11:33 AM [name] wrote: Red Word that prompted transfer to Nurse Triage: Follow up on current situation / yeast infection / bed sores on bottom / brace for knee due and shooting pain from leg down to feet Reason for Disposition  [1] Symptoms of a yeast infection (i.e., itchy, white discharge, not bad smelling) AND [2] feels like prior vaginal yeast infections  Answer Assessment - Initial Assessment Questions 1. SYMPTOM: "What's the main symptom you're concerned about?" (e.g., pain, itching, dryness)     Due to antibiotic 3. ONSET: "When did the yeast infection  start?"     This past week 5. ITCHING: "Is there any itching?" If Yes, ask: "How  bad is it?" (Scale: 1-10; mild, moderate, severe)     moderate 6. CAUSE: "What do you think is causing the discharge?" "Have you had the same problem before? What happened then?"     Penicillin usage 7. OTHER SYMPTOMS: "Do you have any other symptoms?" (e.g., fever, itching, vaginal bleeding, pain with urination, injury to genital area, vaginal foreign body)     Foamy discharge, itchiness  Protocols used: Vaginal Symptoms-A-AH

## 2023-03-24 NOTE — Telephone Encounter (Signed)
Patient has been scheduled for an appointment.

## 2023-03-25 ENCOUNTER — Ambulatory Visit (INDEPENDENT_AMBULATORY_CARE_PROVIDER_SITE_OTHER): Payer: Medicare Other | Admitting: Student

## 2023-03-25 ENCOUNTER — Encounter: Payer: Self-pay | Admitting: Family Medicine

## 2023-03-25 ENCOUNTER — Ambulatory Visit (INDEPENDENT_AMBULATORY_CARE_PROVIDER_SITE_OTHER): Payer: Medicare Other | Admitting: Family Medicine

## 2023-03-25 ENCOUNTER — Encounter: Payer: Self-pay | Admitting: Student

## 2023-03-25 ENCOUNTER — Other Ambulatory Visit (HOSPITAL_COMMUNITY)
Admission: RE | Admit: 2023-03-25 | Discharge: 2023-03-25 | Disposition: A | Discharge status: Home / Self Care | Payer: Medicare Other | Attending: Student | Admitting: Student

## 2023-03-25 VITALS — BP 103/71 | HR 94

## 2023-03-25 VITALS — BP 112/84 | HR 105 | Ht 64.0 in

## 2023-03-25 VITALS — BP 140/86 | HR 91 | Ht 64.0 in

## 2023-03-25 DIAGNOSIS — K5909 Other constipation: Secondary | ICD-10-CM | POA: Diagnosis not present

## 2023-03-25 DIAGNOSIS — G8929 Other chronic pain: Secondary | ICD-10-CM

## 2023-03-25 DIAGNOSIS — B372 Candidiasis of skin and nail: Secondary | ICD-10-CM | POA: Diagnosis not present

## 2023-03-25 DIAGNOSIS — M47816 Spondylosis without myelopathy or radiculopathy, lumbar region: Secondary | ICD-10-CM | POA: Diagnosis not present

## 2023-03-25 DIAGNOSIS — F411 Generalized anxiety disorder: Secondary | ICD-10-CM | POA: Diagnosis not present

## 2023-03-25 DIAGNOSIS — F332 Major depressive disorder, recurrent severe without psychotic features: Secondary | ICD-10-CM | POA: Diagnosis not present

## 2023-03-25 DIAGNOSIS — S81802D Unspecified open wound, left lower leg, subsequent encounter: Secondary | ICD-10-CM | POA: Diagnosis not present

## 2023-03-25 DIAGNOSIS — M533 Sacrococcygeal disorders, not elsewhere classified: Secondary | ICD-10-CM

## 2023-03-25 DIAGNOSIS — M5416 Radiculopathy, lumbar region: Secondary | ICD-10-CM

## 2023-03-25 DIAGNOSIS — M1711 Unilateral primary osteoarthritis, right knee: Secondary | ICD-10-CM | POA: Diagnosis not present

## 2023-03-25 DIAGNOSIS — B3731 Acute candidiasis of vulva and vagina: Secondary | ICD-10-CM

## 2023-03-25 DIAGNOSIS — M25561 Pain in right knee: Secondary | ICD-10-CM

## 2023-03-25 DIAGNOSIS — M545 Low back pain, unspecified: Secondary | ICD-10-CM | POA: Diagnosis not present

## 2023-03-25 MED ORDER — GENTIAN VIOLET 1 % EX SOLN: CUTANEOUS | 0 refills | Status: DC

## 2023-03-25 MED ORDER — AMBULATORY NON FORMULARY MEDICATION: 0 refills | Status: AC

## 2023-03-25 MED ORDER — BORIC ACID VAGINAL 600 MG VA SUPP: 600.0000 mg | Freq: Every evening | VAGINAL | 1 refills | Status: AC

## 2023-03-25 MED ORDER — POLYETHYLENE GLYCOL 3350 17 GM/SCOOP PO POWD: 17.0000 g | Freq: Two times a day (BID) | ORAL | 0 refills | Status: AC

## 2023-03-25 MED ORDER — VORTIOXETINE HBR 5 MG PO TABS: 5.0000 mg | ORAL_TABLET | Freq: Every day | ORAL | 1 refills | Status: DC

## 2023-03-25 NOTE — Progress Notes (Unsigned)
   Acute Office Visit  Subjective:     Patient ID: [name], female    DOB: Nov 16, 1965, 58 y.o.   MRN: [id number]  No chief complaint on file.   HPI Patient is in today for vaginal itching, white discharge for the last few days.  Reports that she has recently been on antibiotics and usually gets yeast infections with them. She is currently  Spoke with   ROS Per HPI      Objective:    LMP  (LMP Unknown)    Physical Exam Vitals and nursing note reviewed.  Constitutional:      Appearance: Normal appearance. She is normal weight.  HENT:     Head: Normocephalic and atraumatic.     Nose: Nose normal.  Eyes:     Extraocular Movements: Extraocular movements intact.     Pupils: Pupils are equal, round, and reactive to light.  Cardiovascular:     Rate and Rhythm: Normal rate and regular rhythm.     Heart sounds: Normal heart sounds.  Pulmonary:     Effort: Pulmonary effort is normal.     Breath sounds: Normal breath sounds.  Musculoskeletal:        General: Normal range of motion.     Cervical back: Normal range of motion.  Neurological:     General: No focal deficit present.     Mental Status: She is alert and oriented to person, place, and time.  Psychiatric:        Mood and Affect: Mood normal.        Thought Content: Thought content normal.   No results found for any visits on 03/25/23.      Assessment & Plan:  ***  No orders of the defined types were placed in this encounter.   No follow-ups on file.  Moshe Cipro, FNP

## 2023-03-25 NOTE — Patient Instructions (Addendum)
Call [facility] for follow up for pain management  May use desitin on the heels as well  I have sent in boric acid for you to use vaginally to help with the yeast.  Trintellix re sent, discussed with psych.  Follow up with specialists as scheduled.   Follow-up with me for new or worsening symptoms.

## 2023-03-25 NOTE — Patient Instructions (Addendum)
Thank you for coming in today.   We will work to authorize gel shots and Zilretta for your knee  Please call [facility] ([facility]) at [phone] to schedule your spine injection.    Wheelchair cushion  You will hear from our office about scheduling your knee injection, once we get insurance approval

## 2023-03-25 NOTE — Progress Notes (Signed)
I, [name], CMA acting as a scribe for [name], MD.  [name] is a 58 y.o. female who presents to [facility] today for f/u lumbar radiculopathy w/ multiple other body pains. Pt was last seen by Dr. Denyse Amass on 03/04/23 and was given a L subacromial and R knee steroid injection. She was advised to cont home health PT and seek counseling.  Today, pt reports right knee pain, some improvement after injection but short-lived. Also continues to have left shoulder pain. Interested in compression sleeve for the right knee. Also interested in cushion for wheelchair. Concerned about sores on heels, husband doesn't see any open wounds but pt feels that there are sores.   Additionally she notes continued low back pain with pain radiating down both legs.  She has a sore on her bottom as well and has been recommended to use a wheelchair cushion.  Dx testing: 07/24/22 L-spine MRI  Pertinent review of systems: No fevers or chills  Relevant historical information: History of chronic back pain previously well treated with facet injections. Current nonhealing wound overlying left anterior knee with patellar tendon exposed.  Exam:  BP (!) 140/86   Pulse 91   Ht 5\' 4"  (1.626 m)   LMP  (LMP Unknown)   SpO2 96%   BMI 43.60 kg/m  General: Well Developed, well nourished, and in no acute distress.   MSK: Right knee normal motion with crepitation.  L-spine nontender to palpation decreased lumbar motion.     Assessment and Plan: 58 y.o. 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  We had a recent right knee injection with steroids which helped a little bit.  Will try to work on authorization for hyaluronic acid injections and/or Zilretta injection as that could help the right knee pain.  Addition for the chronic back pain will repeat facet injections lumbar spine that we did back in July that worked quite well.  Consider  epidural steroid injection in the future.  Developing pressure sore sacrum/coccyx: Written prescription for offloading wheelchair seat cushion.   PDMP not reviewed this encounter. Orders Placed This Encounter  Procedures   DG FACET JT INJ L /S SINGLE LEVEL RIGHT W/FL/CT    Standing Status:   Future    Expiration Date:   04/25/2023    Reason for Exam (SYMPTOM  OR DIAGNOSIS REQUIRED):   low back pain    Is the patient pregnant?:   No    Preferred Imaging Location?:   [facility]   DG FACET JT INJ L /S 2ND LEVEL RIGHT W/FL/CT    Standing Status:   Future    Expiration Date:   04/25/2023    Reason for Exam (SYMPTOM  OR DIAGNOSIS REQUIRED):   low back pain    Is the patient pregnant?:   No    Preferred Imaging Location?:   [facility]   Meds ordered this encounter  Medications   AMBULATORY NON FORMULARY MEDICATION    Sig: Off-loading wheel chair seat cusion Dispense 1 Dx code: M53.3 Use as needed    Dispense:  1 Units    Refill:  0     Discussed warning signs or symptoms. Please see discharge instructions. Patient expresses understanding.   The above documentation has been reviewed and is accurate and complete [name], M.D.  Total encounter time 30 minutes including face-to-face time with the patient and, reviewing past medical record, and  charting on the date of service.

## 2023-03-25 NOTE — Progress Notes (Addendum)
Referring Provider [name], MD [address] [place],  [place] [address]   CC: No chief complaint on file.     [name] is an 58 y.o. female.  HPI: Patient is a 59 year old female with history of a large wound to her left lower extremity. She underwent debridement of the left lower extremity wound with placement of myriad tissue matrix with Dr. Ladona Ridgel on 02/06/2023 initially. She then underwent another wound debridement and placement of tissue replacement matrix with Dr. Ladona Ridgel on 02/24/2023.  She presents to the clinic today to obtain culture of her left knee wound.  Patient was seen in the clinic yesterday.  At this visit, patient was doing okay.  On exam, there was some greenish discharge noted to the top of the Adaptic.  Upon removal of the Adaptic, tendon remained exposed.  There is otherwise healthy appearing granulation tissue throughout the majority of the wound.  Since patient's visit yesterday, message was sent to patient's ID Dr. In regards to greenish drainage.  He recommended cultures to be obtained to rule out Pseudomonas infection.  Patient surgery will be postponed until results of the culture are returned.  Today, patient presents with her husband at bedside.  Patient today is tearful and states that she is very frustrated with the chronicity of her left lower extremity wound.  Denies any new concerns with her wound since yesterday.  Review of Systems General: Does not report any infectious symptoms  Physical Exam    03/24/2023   10:26 AM 03/17/2023   12:48 PM 03/14/2023    1:32 PM  Vitals with BMI  Height   5\' 4"   Weight 254 lbs 253 lbs 10 oz   BMI 43.58 43.51   Systolic 136 118 604  Diastolic 96 88 83  Pulse 97 93 99    General:  No acute distress,  Alert and oriented, Non-Toxic, Normal speech and affect On exam, patient is sitting upright in no acute distress.  Dressings are noted to have a little bit of a green tinge to them.  Upon removal of the  dressings, tendon remains exposed.  There is unincorporated myriad to the superior/lateral aspect of the wound.  There appears to be granulation tissue with some superficial exudate otherwise noted throughout the wound.  Assessment/Plan  Wound of left lower extremity, subsequent encounter - Plan: Aerobic/Anaerobic Culture w Gram Stain (surgical/deep wound)   Cultures were obtained today at bedside.  Patient tolerated well.  Discussed with the patient that we we will see with the cultures grow out.  Discussed with her that surgery will be postponed until we know what the cultures grew out.  Discussed with her that this can take 3 to 5 days.  Discussed with her that surgery will not be later this week.  Discussed with her that if cultures do not grow out Pseudomonas, she may possibly have surgery in the middle of next week.  I did discuss with her though that if cultures do grow out Pseudomonas, she would need to be treated with IV antibiotics.  Patient expressed understanding.  I encouraged the patient to follow-up with her PCP and psych provider to further discuss her emotional mental health especially in the setting of having this chronic wound.  Patient's husband states that they are going to go to the psych provider's office today to discuss some medication changes.  Encourage patient to also follow-up with her PCP.  Patient expressed understanding.  We will plan to see the  patient back next week.  Surgery will be pending culture results.  Instructed the patient to call in the meantime she has any questions or concerns about anything.  Pictures were obtained of the patient and placed in the chart with the patient's or guardian's permission.   [name] 03/25/2023, 11:40 AM

## 2023-03-26 ENCOUNTER — Encounter: Payer: Medicare Other | Admitting: Family Medicine

## 2023-03-26 ENCOUNTER — Encounter: Payer: Self-pay | Admitting: Family Medicine

## 2023-03-26 MED ORDER — HYDROXYZINE HCL 25 MG PO TABS: 25.0000 mg | ORAL_TABLET | Freq: Three times a day (TID) | ORAL | 1 refills | Status: DC | PRN

## 2023-03-27 ENCOUNTER — Other Ambulatory Visit: Payer: Self-pay | Admitting: Internal Medicine

## 2023-03-27 ENCOUNTER — Telehealth: Payer: Self-pay

## 2023-03-27 DIAGNOSIS — I96 Gangrene, not elsewhere classified: Secondary | ICD-10-CM | POA: Diagnosis not present

## 2023-03-27 DIAGNOSIS — A498 Other bacterial infections of unspecified site: Secondary | ICD-10-CM

## 2023-03-27 DIAGNOSIS — I5032 Chronic diastolic (congestive) heart failure: Secondary | ICD-10-CM | POA: Diagnosis not present

## 2023-03-27 DIAGNOSIS — I5023 Acute on chronic systolic (congestive) heart failure: Secondary | ICD-10-CM | POA: Diagnosis not present

## 2023-03-27 DIAGNOSIS — T8189XA Other complications of procedures, not elsewhere classified, initial encounter: Secondary | ICD-10-CM | POA: Diagnosis not present

## 2023-03-27 DIAGNOSIS — I152 Hypertension secondary to endocrine disorders: Secondary | ICD-10-CM | POA: Diagnosis not present

## 2023-03-27 DIAGNOSIS — T8089XA Other complications following infusion, transfusion and therapeutic injection, initial encounter: Secondary | ICD-10-CM | POA: Diagnosis not present

## 2023-03-27 NOTE — Telephone Encounter (Signed)
I will also work with Elita Quick with [facility] to see if the Picc team is able to place a picc for the patient at Short Stay next week sometime, so the patient does not have to wait until next week.  Patient advised we are waiting on sensitivity report to come back before we determine what medication the patient will start.  Kaithlyn Teagle [name], CMA

## 2023-03-27 NOTE — Telephone Encounter (Signed)
-----   Message from Shubuta sent at 03/27/2023  3:05 PM EST ----- Regarding: RE: Culture OPAT note placed, if we can start treating sooner would be better ideally next week (but understand picc placement limited by ir availability)  Will defer to plastic surgery if timing wise their team wants sooner and that might require inpatient admission to start  I can't place opat yet until sensitivity is known which is likely Monday 2/3 ----- Message ----- From: Judith Part, CMA Sent: 03/27/2023   3:02 PM EST To: Raymondo Band, MD; [name], PA-C Subject: RE: Culture                                    Patient has agreed to start IV abx. If we could get a OPAT note placed and which abx she will be starting. I can pass the information to [facility]. Also the soonest appointment will be 04/07/23 @ 8 am please let me know if she needs this picc line sooner. ----- Message ----- From: Raymondo Band, MD Sent: 03/27/2023   1:45 PM EST To: [name], PA-C; [facility] Clinical Pool Subject: RE: Culture                                    Yeah so sensitivity would help  No oral option outside of quinoline which you want to avoid  I recommend if you want to decrease burden load to optimize graft then treat for at least 3 to 7 days before the graft, and continue treating for another 2 weeks after graft is placed  She will need iv abx (choice based on susceptibility) either crfepime, pip-tazo, or meropenem   Outpatient PICC arrangement can take several days as mentioned but if this is desired we can order now the picc line and wait on what abx to use  I imagine admitting her for treatment is out of the question from practicality standpoint   Please let us know how you want to proceed   --------- [facility] could we help order a PICC placement for her by next week if possible and have her follow up next week  Thanks ----- Message ----- From: Blanch Media Sent: 03/27/2023   1:38 PM  EST To: Raymondo Band, MD; [name], MD Subject: Culture                                        Good Afternoon Dr. Renold Don,   I had Ms. Livers come into the clinic on Tuesday for cultures. The preliminary results do show pseudomonas:  Specimen Description WOUND Special Requests LEFT LOWER EXTREMITY Gram Stain NO WBC SEEN FEW GRAM NEGATIVE RODS Culture MODERATE PSEUDOMONAS AERUGINOSA SUSCEPTIBILITIES TO FOLLOW Performed at [facility], [address] [place], [place] [address]  Given it has grown out pseudomonas, we appreciate any of your recommendations and assistance so we can optimize her for surgery.   Dr. Ladona Ridgel has also been included on this message.   Thank you, Caroline More

## 2023-03-27 NOTE — Progress Notes (Signed)
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

## 2023-03-28 ENCOUNTER — Other Ambulatory Visit: Payer: Self-pay

## 2023-03-28 ENCOUNTER — Other Ambulatory Visit: Payer: Self-pay | Admitting: Student

## 2023-03-28 ENCOUNTER — Telehealth: Payer: Self-pay | Admitting: Internal Medicine

## 2023-03-28 ENCOUNTER — Ambulatory Visit: Admit: 2023-03-28 | Payer: Medicare Other | Admitting: Plastic Surgery

## 2023-03-28 DIAGNOSIS — S81802D Unspecified open wound, left lower leg, subsequent encounter: Secondary | ICD-10-CM

## 2023-03-28 DIAGNOSIS — I5023 Acute on chronic systolic (congestive) heart failure: Secondary | ICD-10-CM | POA: Diagnosis not present

## 2023-03-28 DIAGNOSIS — I152 Hypertension secondary to endocrine disorders: Secondary | ICD-10-CM | POA: Diagnosis not present

## 2023-03-28 DIAGNOSIS — A498 Other bacterial infections of unspecified site: Secondary | ICD-10-CM

## 2023-03-28 DIAGNOSIS — T8189XA Other complications of procedures, not elsewhere classified, initial encounter: Secondary | ICD-10-CM | POA: Diagnosis not present

## 2023-03-28 DIAGNOSIS — L089 Local infection of the skin and subcutaneous tissue, unspecified: Secondary | ICD-10-CM

## 2023-03-28 DIAGNOSIS — I96 Gangrene, not elsewhere classified: Secondary | ICD-10-CM | POA: Diagnosis not present

## 2023-03-28 DIAGNOSIS — I5032 Chronic diastolic (congestive) heart failure: Secondary | ICD-10-CM | POA: Diagnosis not present

## 2023-03-28 DIAGNOSIS — T8089XA Other complications following infusion, transfusion and therapeutic injection, initial encounter: Secondary | ICD-10-CM | POA: Diagnosis not present

## 2023-03-28 SURGERY — IRRIGATION AND DEBRIDEMENT WOUND
Anesthesia: Choice | Laterality: Left

## 2023-03-28 NOTE — Progress Notes (Signed)
Order for surgery

## 2023-03-28 NOTE — Telephone Encounter (Signed)
Diagnosis: Wound infection    Culture Result: actinomyces species, pseudomonas   Plan of treatment: 4 weeks piptazo 13.5 gram continuous daily infusion -- after that would go back to amoxicillin 1 gram tid  After 1 week on piptazo ok to proceed with graft surgery if the wound looks healthy and clinical improvement in terms of soft tissue infection   Kidney function: Last metabolic panel Lab Results  Component Value Date   GLUCOSE 173 (H) 02/28/2023   NA 140 02/28/2023   K 4.1 02/28/2023   CL 100 02/28/2023   CO2 24 02/28/2023   BUN 21 02/28/2023   CREATININE 1.01 02/28/2023   GFRNONAA >60 01/14/2023   CALCIUM 9.9 02/28/2023   PHOS 3.2 12/10/2022   PROT 7.0 02/28/2023   ALBUMIN 2.8 (L) 12/18/2022   BILITOT 1.1 02/28/2023   ALKPHOS 42 12/18/2022   AST 26 02/28/2023   ALT 30 (H) 02/28/2023   ANIONGAP 12 01/14/2023   OPAT Orders Discharge antibiotics to be given via PICC line Discharge antibiotics: Pip-tazo 13.5 gram continuous daily infusion   Duration: 4 weeks   End Date: To determine from start date of picc line placement  Resume amoxicillin 1 gram tid after finished with piptazo   PIC Care Per Protocol:  Home health RN for IV administration and teaching; PICC line care and labs.    Labs weekly while on IV antibiotics: _x_ CBC with differential __ BMP _x_ CMP _x_ CRP __ ESR __ Vancomycin trough __ CK  __ Please pull PIC at completion of IV antibiotics _x_ Please leave PIC in place until doctor has seen patient or been notified  Fax weekly labs to [phone]

## 2023-03-28 NOTE — Telephone Encounter (Signed)
For start date please use  04/02/2023 for pip-tazo 13.5 gram continuous daily infusion   End date for pip-tazo 04/30/2023  Start amoxicillin 1 gram tid on 05/01/2023

## 2023-03-28 NOTE — Telephone Encounter (Signed)
Patient will be getting picc placed with the picc team at Short Stay. [name] has arranged the appt. 04/02/23 @ 11am. Patient's husband aware of the appointment information.  Opat orders sent to [facility] as well.  [name] [name], CMA

## 2023-03-31 ENCOUNTER — Ambulatory Visit: Payer: Medicare Other | Admitting: Student

## 2023-03-31 DIAGNOSIS — S81802D Unspecified open wound, left lower leg, subsequent encounter: Secondary | ICD-10-CM | POA: Diagnosis not present

## 2023-03-31 LAB — AEROBIC/ANAEROBIC CULTURE W GRAM STAIN (SURGICAL/DEEP WOUND): Gram Stain: NONE SEEN

## 2023-03-31 NOTE — Telephone Encounter (Signed)
Patient's husband refused to have first dose at home.  I have spoke with Nytelia with short stay to advise her patient will be there for picc team to place picc line for the patient on 04/02/23  and patient will need first dose of IV abx.  Nytelia will follow up with her charge nurse in the morning to make sure it is ok and let us know.  I will place short stay order form in Dr. Orlando Penner box for him to sign tomorrow. We will fax order to short stay tomorrow.  [name], CMA

## 2023-03-31 NOTE — H&P (View-Only) (Signed)
 Referring Provider [name], MD [address] [place],  [place] [address]   CC:  Chief Complaint  Patient presents with   Post-op Follow-up      [name] is an 58 y.o. female.  HPI: Patient is a 58 year old female with history of a large wound to her left lower extremity. She underwent debridement of the left lower extremity wound with placement of myriad tissue matrix with Dr. Ladona Ridgel on 02/06/2023 initially. She then underwent another wound debridement and placement of tissue replacement matrix with Dr. Ladona Ridgel on 02/24/2023. She presents to the clinic today for further follow up on her left lower extremity wound.   Patient was last seen in the clinic on 03/25/2023.  Cultures were obtained of her left lower extremity wound.  On exam, dressings were noted to have a little bit of a green tinge to them.  Upon removal of the dressings, tendon remained exposed.  There was unincorporated myriad noted to the superior/lateral aspect of the wound.  Cultures have since grown out moderate Pseudomonas.  Infectious disease was made aware and PICC order was placed by them.  Per chart review, appears PICC line will be placed on 04/02/2023.  Patient will need 3 to 7 days of the antibiotics prior to the surgery.  Today, patient presents with her husband at bedside.  She reports she is doing okay.  She states that her knee sometimes feels tight and and that she has nerve pain going down her leg from time to time.  She denies any fevers or chills.  Denies any significant changes since previous visit.  Patient also reports that she is planning on getting her PICC line on Wednesday.  Patient states that she has an appointment with pain management next week.  Review of Systems General: Denies fevers or chills  Physical Exam    03/25/2023    2:48 PM 03/25/2023    1:26 PM 03/25/2023   11:44 AM  Vitals with BMI  Height 5\' 4"  5\' 4"    Weight --    Systolic 112 140 604  Diastolic 84 86 71  Pulse  105 91 94    General:  No acute distress,  Alert and oriented, Non-Toxic, Normal speech and affect On exam, patient is sitting upright in no acute distress.  Tendon remains exposed.  There appears to be healthy appearing granulation tissue throughout the remainder of the wound.  There are some superficial exudate noted superiorly.  There does appear to be improved epithelialization at the borders.  There does though appear to be some firmness underneath some of the skin, consistent with some scar tissue.    Assessment/Plan  Wound of left lower extremity, subsequent encounter   Recommended that patient continue with her dressing changes as she has been doing.  Discussed with her that she may gently massage the areas of firmness to the skin at the borders of the wound where the scar tissue is.  Discussed with her this may help with some of the tightness.  Patient expressed understanding.  Discussed with patient that Dr. Ladona Ridgel will plan to take her to surgery for irrigation and debridement of her left lower extremity wound with placement of Integra over her tendon the week of 04/15/2023.  Our surgery scheduling team has been notified.  Recommended that she discuss pain going down her legs with her pain management provider.  Discussed with her this is probably neuropathic.  Patient expressed understanding.  Patient to follow back up next week.  Instructed her to call in the meantime she has any questions or concerns about anything.  Pictures were obtained of the patient and placed in the chart with the patient's or guardian's permission.   [name] 03/31/2023, 2:47 PM

## 2023-03-31 NOTE — Progress Notes (Cosign Needed)
Referring Provider [name], MD [address] [place],  [place] [address]   CC:  Chief Complaint  Patient presents with   Post-op Follow-up      [name] is an 58 y.o. female.  HPI: Patient is a 58 year old female with history of a large wound to her left lower extremity. She underwent debridement of the left lower extremity wound with placement of myriad tissue matrix with Dr. Ladona Ridgel on 02/06/2023 initially. She then underwent another wound debridement and placement of tissue replacement matrix with Dr. Ladona Ridgel on 02/24/2023. She presents to the clinic today for further follow up on her left lower extremity wound.   Patient was last seen in the clinic on 03/25/2023.  Cultures were obtained of her left lower extremity wound.  On exam, dressings were noted to have a little bit of a green tinge to them.  Upon removal of the dressings, tendon remained exposed.  There was unincorporated myriad noted to the superior/lateral aspect of the wound.  Cultures have since grown out moderate Pseudomonas.  Infectious disease was made aware and PICC order was placed by them.  Per chart review, appears PICC line will be placed on 04/02/2023.  Patient will need 3 to 7 days of the antibiotics prior to the surgery.  Today, patient presents with her husband at bedside.  She reports she is doing okay.  She states that her knee sometimes feels tight and and that she has nerve pain going down her leg from time to time.  She denies any fevers or chills.  Denies any significant changes since previous visit.  Patient also reports that she is planning on getting her PICC line on Wednesday.  Patient states that she has an appointment with pain management next week.  Review of Systems General: Denies fevers or chills  Physical Exam    03/25/2023    2:48 PM 03/25/2023    1:26 PM 03/25/2023   11:44 AM  Vitals with BMI  Height 5\' 4"  5\' 4"    Weight --    Systolic 112 140 604  Diastolic 84 86 71  Pulse  105 91 94    General:  No acute distress,  Alert and oriented, Non-Toxic, Normal speech and affect On exam, patient is sitting upright in no acute distress.  Tendon remains exposed.  There appears to be healthy appearing granulation tissue throughout the remainder of the wound.  There are some superficial exudate noted superiorly.  There does appear to be improved epithelialization at the borders.  There does though appear to be some firmness underneath some of the skin, consistent with some scar tissue.    Assessment/Plan  Wound of left lower extremity, subsequent encounter   Recommended that patient continue with her dressing changes as she has been doing.  Discussed with her that she may gently massage the areas of firmness to the skin at the borders of the wound where the scar tissue is.  Discussed with her this may help with some of the tightness.  Patient expressed understanding.  Discussed with patient that Dr. Ladona Ridgel will plan to take her to surgery for irrigation and debridement of her left lower extremity wound with placement of Integra over her tendon the week of 04/15/2023.  Our surgery scheduling team has been notified.  Recommended that she discuss pain going down her legs with her pain management provider.  Discussed with her this is probably neuropathic.  Patient expressed understanding.  Patient to follow back up next week.  Instructed her to call in the meantime she has any questions or concerns about anything.  Pictures were obtained of the patient and placed in the chart with the patient's or guardian's permission.   [name] 03/31/2023, 2:47 PM

## 2023-04-01 ENCOUNTER — Other Ambulatory Visit (HOSPITAL_COMMUNITY): Payer: Self-pay | Admitting: *Deleted

## 2023-04-01 ENCOUNTER — Ambulatory Visit: Payer: Medicare Other | Admitting: Family Medicine

## 2023-04-01 NOTE — Telephone Encounter (Signed)
Order form faxed to [facility] short stay.  Faria Casella Lesli Albee, CMA

## 2023-04-02 ENCOUNTER — Telehealth: Payer: Self-pay

## 2023-04-02 ENCOUNTER — Other Ambulatory Visit: Payer: Self-pay

## 2023-04-02 ENCOUNTER — Ambulatory Visit (HOSPITAL_COMMUNITY)
Admission: RE | Admit: 2023-04-02 | Discharge: 2023-04-02 | Disposition: A | Discharge status: Home / Self Care | Payer: Medicare Other | Source: Ambulatory Visit | Attending: Internal Medicine | Admitting: Internal Medicine

## 2023-04-02 ENCOUNTER — Other Ambulatory Visit: Payer: Medicare Other | Admitting: Pharmacist

## 2023-04-02 ENCOUNTER — Ambulatory Visit
Admission: RE | Admit: 2023-04-02 | Discharge: 2023-04-02 | Disposition: A | Discharge status: Home / Self Care | Payer: Self-pay | Source: Ambulatory Visit | Attending: Internal Medicine

## 2023-04-02 VITALS — BP 98/67 | HR 68 | Temp 98.0°F | Resp 18

## 2023-04-02 DIAGNOSIS — Z79899 Other long term (current) drug therapy: Secondary | ICD-10-CM | POA: Diagnosis present

## 2023-04-02 DIAGNOSIS — K5909 Other constipation: Secondary | ICD-10-CM

## 2023-04-02 DIAGNOSIS — E1165 Type 2 diabetes mellitus with hyperglycemia: Secondary | ICD-10-CM

## 2023-04-02 DIAGNOSIS — A498 Other bacterial infections of unspecified site: Secondary | ICD-10-CM

## 2023-04-02 DIAGNOSIS — B965 Pseudomonas (aeruginosa) (mallei) (pseudomallei) as the cause of diseases classified elsewhere: Secondary | ICD-10-CM | POA: Diagnosis not present

## 2023-04-02 DIAGNOSIS — X58XXXD Exposure to other specified factors, subsequent encounter: Secondary | ICD-10-CM | POA: Insufficient documentation

## 2023-04-02 DIAGNOSIS — S81802D Unspecified open wound, left lower leg, subsequent encounter: Secondary | ICD-10-CM | POA: Insufficient documentation

## 2023-04-02 MED ORDER — CHLORHEXIDINE GLUCONATE CLOTH 2 % EX PADS: 6.0000 | MEDICATED_PAD | Freq: Every day | CUTANEOUS | Status: DC

## 2023-04-02 MED ORDER — ACCU-CHEK SOFTCLIX LANCETS MISC: 12 refills | Status: AC

## 2023-04-02 MED ORDER — PIPERACILLIN-TAZOBACTAM 3.375 G IVPB 30 MIN
3.3750 g | Freq: Once | INTRAVENOUS | Status: AC
  Administered 2023-04-02: 3.375 g via INTRAVENOUS
  Filled 2023-04-02: qty 50

## 2023-04-02 MED ORDER — SENNOSIDES-DOCUSATE SODIUM 8.6-50 MG PO TABS: 2.0000 | ORAL_TABLET | Freq: Every day | ORAL | 0 refills | Status: DC

## 2023-04-02 MED ORDER — HEPARIN SOD (PORK) LOCK FLUSH 100 UNIT/ML IV SOLN: 250.0000 [IU] | INTRAVENOUS | Status: AC | PRN

## 2023-04-02 MED ORDER — SODIUM CHLORIDE 0.9% FLUSH: 10.0000 mL | Freq: Two times a day (BID) | INTRAVENOUS | Status: DC

## 2023-04-02 MED ORDER — SODIUM CHLORIDE 0.9% FLUSH: 10.0000 mL | INTRAVENOUS | Status: DC | PRN

## 2023-04-02 MED ORDER — HEPARIN SOD (PORK) LOCK FLUSH 100 UNIT/ML IV SOLN
INTRAVENOUS | Status: AC
  Administered 2023-04-02: 250 [IU]
  Filled 2023-04-02: qty 5

## 2023-04-02 NOTE — Telephone Encounter (Signed)
 Patient and her husband called office for clarification on antibiotics and heparin  flushes.  Would like to know if pt should continue with oral antibiotics now that she has picc line placed. Per Dr. Overton okay to stop amoxicillin .  Patient is also requesting Heparin  be sent with antibiotics and saline flushes. Was told that due to Heparin  being pork derived product would not be able to provide this to pt due to religious beliefs. Pt verbalized that she would prefer to continue with Heparin  to avoid clotting picc line.  Sent message to [employer] to reach out to pt to discuss this . Lorenda CHRISTELLA Code, RMA

## 2023-04-02 NOTE — Progress Notes (Addendum)
 When antibiotic finished pt and spouse said they were accepting of using the heparin  in the picc line despite it being derived from pork. Called and left a message with Kay Parson from [facility] to make her aware.

## 2023-04-02 NOTE — Telephone Encounter (Signed)
 Patient called office back regarding missed called. Informed patient that call was not from myself or pharmacy team who are working with [employer] regarding Heparin  order. Pt is requesting update on order. States if she does not get Heparin  with her antibiotics will not infuse until this is received. Is worried about possible blood clot.  Per [name]; .  We will need to have the Heparin  removed as an allergy  in order to send any heparin  to the home. Also, we will not be using any heparin  for this patient since she is a continuous infusion.  We typically do send 2-3 heparin  to have in the home if needed for sluggish line, etc.  Adding Cassie and Alan, I need your help to remove from her allergy  list or add a note stating basically pt does not have an allergy  but originally due to religious preference to not receive heparin  but pt is now requesting.   Spoke with Alan who will provide verbal orders to [employer]. Updated allergy  to pork products with note Religious preferences.    [name] updated. She will call patient today with update and explain how to use heparin .  Lorenda CHRISTELLA Code, RMA

## 2023-04-02 NOTE — Progress Notes (Signed)
 Spoke with Kay Parson from [facility] as well as IV team regarding pt's religious beliefs with pork.  [name] stated to just flush with normal saline flush instead of heparin  once antibiotic is completed and Gasper Karst would take it from there.

## 2023-04-02 NOTE — Progress Notes (Signed)
 Peripherally Inserted Central Catheter Placement  The IV Nurse has discussed with the patient and/or persons authorized to consent for the patient, the purpose of this procedure and the potential benefits and risks involved with this procedure.  The benefits include less needle sticks, lab draws from the catheter, and the patient may be discharged home with the catheter. Risks include, but not limited to, infection, bleeding, blood clot (thrombus formation), and puncture of an artery; nerve damage and irregular heartbeat and possibility to perform a PICC exchange if needed/ordered by physician.  Alternatives to this procedure were also discussed.  Bard Power PICC patient education guide, fact sheet on infection prevention and patient information card has been provided to patient /or left at bedside.    PICC Placement Documentation  PICC Single Lumen 04/02/23 Right Basilic 42 cm 0 cm (Active)  Indication for Insertion or Continuance of Line Home intravenous therapies (PICC only) 04/02/23 1211  Exposed Catheter (cm) 1 cm 04/02/23 1211  Site Assessment Clean, Dry, Intact 04/02/23 1211  Line Status Flushed;Saline locked;Blood return noted 04/02/23 1211  Dressing Type Transparent;Securing device 04/02/23 1211  Dressing Status Antimicrobial disc/dressing in place 04/02/23 1211  Line Care Connections checked and tightened 04/02/23 1211  Line Adjustment (NICU/IV Team Only) No 04/02/23 1211  Dressing Change Due 04/09/23 04/02/23 1211       Bonni Rock Larve 04/02/2023, 12:23 PM

## 2023-04-02 NOTE — Progress Notes (Signed)
 04/02/2023 Name: [name] MRN: [id number] DOB: Jun 24, 1965  Chief Complaint  Patient presents with   Medication Management    [name] is a 58 y.o. year old female who presented for a telephone visit.   They were referred to the pharmacist by their PCP for assistance in managing complex medication management.    Subjective:  Care Team: Primary Care Provider: Joshua Debby CROME, MD ; Next Scheduled Visit: 2/18   Medication Access/Adherence  Current Pharmacy:  [id number] [place], [place] - [address] [place] [address] Phone: [phone] Fax: [phone]  [facility] - [place], [place] - [address] [place] [address] Phone: [phone] Fax: [phone]  [facility] [address] [place] [address] Phone: [phone] Fax: [phone]   Patient reports affordability concerns with their medications: No  Patient reports access/transportation concerns to their pharmacy: No  Patient reports adherence concerns with their medications:  No    Spoke to patient's husband who manages pts medications and cares for her.  Reviewed medications in detail to ensure pt had all medications prescribed. Coreg  is on pt's med list however husband states Dr. Lavona said she should not be taking it.  Pt has not been taking topiramate  - husband states this was started in the hospital but no Rx was ever given for it after discharge Asks for sennokot prescription    Objective:  Lab Results  Component Value Date   HGBA1C 6.6 (H) 12/07/2022    Lab Results  Component Value Date   CREATININE 1.01 02/28/2023   BUN 21 02/28/2023   NA 140 02/28/2023   K 4.1 02/28/2023   CL 100 02/28/2023   CO2 24 02/28/2023    Lab Results  Component Value Date   CHOL 194 08/13/2022   HDL 48.90 08/13/2022   LDLCALC 117 (H) 08/13/2022   LDLDIRECT 143.8 04/03/2011   TRIG 208 (H)  12/06/2022   CHOLHDL 4 08/13/2022    Medications Reviewed Today     Reviewed by Merceda Lela SAUNDERS, RPH (Pharmacist) on 04/02/23 at 1513  Med List Status: <None>   Medication Order Taking? Sig Documenting Provider Last Dose Status Informant  acetaminophen  (TYLENOL ) 325 MG tablet [id number]  Take 1-2 tablets (325-650 mg total) by mouth every 4 (four) hours as needed for mild pain (pain score 1-3). Maurice Sharlet RAMAN, PA-C  Active Spouse/Significant Other  AMBULATORY NON FORMULARY MEDICATION [id number]  Off-loading wheel chair seat cusion Dispense 1 Dx code: M53.3 Use as needed Corey, Evan S, MD  Active   atorvastatin  (LIPITOR ) 80 MG tablet [id number] Yes Take 1 tablet (80 mg total) by mouth daily. Joshua Debby CROME, MD Taking Active Spouse/Significant Other  Blood Glucose Monitoring Suppl DEVI [id number]  1 each by Does not apply route in the morning, at noon, and at bedtime. May substitute to any manufacturer covered by patient's insurance. Elnor Lauraine BRAVO, NP  Active Spouse/Significant Other  Boric Acid Vaginal 600 MG SUPP [id number] No Place 600 mg vaginally at bedtime.  Patient not taking: Reported on 04/02/2023   Alvia Krabbe, FNP Not Taking Active   collagenase  (SANTYL ) 250 UNIT/GM ointment [id number]  Apply 1 Application topically daily. Andris Estefana BRAVO, PA-C  Active Spouse/Significant Other  dapagliflozin  propanediol (FARXIGA ) 10 MG TABS tablet [id number] Yes Take 1 tablet (10 mg total) by mouth daily. Lavona Agent, MD Taking Active Spouse/Significant Other  fluticasone  (FLONASE ) 50 MCG/ACT nasal spray [id number] Yes Place 2  sprays into both nostrils daily.  Patient taking differently: Place 2 sprays into both nostrils daily as needed for rhinitis.   Joshua Debby CROME, MD Taking Active Spouse/Significant Other  furosemide  (LASIX ) 40 MG tablet [id number] Yes Take 1 tablet (40 mg total) by mouth daily. Lavona Agent, MD Taking Active Spouse/Significant Other           Med Note CHRISTIE ALYSON [place] Feb 05, 2023 10:53 AM) Confirmed taking  gentian violet  (GNP GENTIAN VIOLET ) 1 % topical solution [id number] Yes Apply topically to affected area every 2-3 days as needed. May wash off in between applications. Alvia Krabbe, FNP Taking Active   Glucose Blood (BLOOD GLUCOSE TEST STRIPS) STRP [id number]  1 each by In Vitro route in the morning, at noon, and at bedtime. May substitute to any manufacturer covered by patient's insurance. Elnor Lauraine BRAVO, NP  Active Spouse/Significant Other  hydrOXYzine  (ATARAX ) 25 MG tablet [id number] Yes Take 1 tablet (25 mg total) by mouth 3 (three) times daily as needed. Alvia Krabbe, FNP Taking Active   ketoconazole  (NIZORAL ) 2 % cream [id number]  Apply 1 Application topically daily. Apply 1gm to skin on plantar aspect of affected foot, and massage in well.  Apply once daily. McCaughan, Dia D, DPM  Active Spouse/Significant Other           Med Note CHRISTIE ALYSON   Wed Feb 05, 2023 10:50 AM) Has not picked up yet  LORazepam  (ATIVAN ) 0.5 MG tablet [id number] No Take 1 tablet (0.5 mg total) by mouth 2 (two) times daily as needed for anxiety.  Patient not taking: Reported on 04/02/2023   Nwoko, Uchenna E, PA Not Taking Active   losartan  (COZAAR ) 25 MG tablet [id number] Yes Take 0.5 tablets (12.5 mg total) by mouth daily. Lavona Agent, MD Taking Active Spouse/Significant Other           Med Note VIOLETTA, Jaymes Revels R   Wed Apr 02, 2023  3:02 PM)    magnesium  oxide (MAG-OX) 400 MG tablet [id number] Yes Take 1 tablet (400 mg total) by mouth at bedtime. Lavona Agent, MD Taking Active Spouse/Significant Other  metFORMIN  (GLUCOPHAGE -XR) 500 MG 24 hr tablet [id number] Yes Take 1 tablet (500 mg total) by mouth 2 (two) times daily with a meal. Joshua Debby CROME, MD Taking Active Spouse/Significant Other  metoprolol  succinate (TOPROL -XL) 50 MG 24 hr tablet [id number] Yes Take 1 tablet (50 mg total) by mouth daily. Take with or immediately following a meal.  Lavona Agent, MD Taking Active Spouse/Significant Other  Multiple Vitamin (MULTIVITAMIN WITH MINERALS) TABS tablet [id number] No Take 1 tablet by mouth daily.  Patient not taking: Reported on 04/02/2023   Maurice Sharlet GORMAN DEVONNA Not Taking Active Spouse/Significant Other  NON FORMULARY [id number]  Pt uses cpap nightly [provider]  Active Spouse/Significant Other  nutrition supplement, JUVEN, (JUVEN) PACK [id number]  Take 1 packet by mouth 2 (two) times daily between meals. Maurice Sharlet GORMAN, PA-C  Active Spouse/Significant Other  ondansetron  (ZOFRAN -ODT) 4 MG disintegrating tablet [id number] No Take 1 tablet (4 mg total) by mouth every 8 (eight) hours as needed for nausea or vomiting.  Patient not taking: Reported on 04/02/2023   Overton Faith T, MD Not Taking Active   Oxycodone  HCl 10 MG TABS [id number] Yes Take 1 tablet (10 mg total) by mouth 3 (three) times daily as needed.  Patient taking differently: Take 5-10 mg by mouth every 6 (six) hours as needed (pain).   Joshua Debby CROME, MD Taking Active  Spouse/Significant Other  polyethylene glycol powder (GLYCOLAX /MIRALAX ) 17 GM/SCOOP powder [id number] Yes Take 1 capful (17 g) in water 2 (two) times daily. Alvia Krabbe, FNP Taking Active   potassium chloride  SA (KLOR-CON  M) 20 MEQ tablet [id number] Yes Take 1 tablet (20 mEq total) by mouth daily. Lavona Agent, MD Taking Active Spouse/Significant Other  senna-docusate (SENOKOT-S) 8.6-50 MG tablet [id number] No Take 2 tablets by mouth at bedtime.  Patient not taking: Reported on 04/02/2023   Maurice Sharlet RAMAN, [place] Not Taking Active Spouse/Significant Other  spironolactone  (ALDACTONE ) 25 MG tablet [id number] Yes Take 0.5 tablets (12.5 mg total) by mouth daily. Lavona Agent, MD Taking Active Spouse/Significant Other  topiramate  (TOPAMAX ) 25 MG tablet [id number] No Take 1 tablet (25 mg total) by mouth at bedtime.  Patient not taking: Reported on 04/02/2023   Maurice Sharlet RAMAN, [place] Not Taking Active  Spouse/Significant Other  vortioxetine  HBr (TRINTELLIX ) 5 MG TABS tablet [id number] No Take 1 tablet (5 mg total) by mouth daily.  Patient not taking: Reported on 04/02/2023   Alvia Krabbe, FNP Not Taking Active               Assessment/Plan:  Med review: - Did find 12/23 ecnounter in chart where Dr. Lavona does confirm pt is off coreg . Unsure why it was not removed from list, however I did discontinue the medication from her med list - Appears topiramate  was added during hospitalization for mood. Pt follows with psych. - Have sent rx for lancets and sennokot, however did inform husband insurance likely will not pay for sennokot due to OTC availability  Follow Up Plan: PRN  Darrelyn Drum, PharmD, BCPS, CPP Clinical Pharmacist Practitioner [facility] [phone]

## 2023-04-03 DIAGNOSIS — E1169 Type 2 diabetes mellitus with other specified complication: Secondary | ICD-10-CM | POA: Diagnosis not present

## 2023-04-03 DIAGNOSIS — G5601 Carpal tunnel syndrome, right upper limb: Secondary | ICD-10-CM | POA: Diagnosis not present

## 2023-04-03 DIAGNOSIS — I5042 Chronic combined systolic (congestive) and diastolic (congestive) heart failure: Secondary | ICD-10-CM | POA: Diagnosis not present

## 2023-04-03 DIAGNOSIS — Z6841 Body Mass Index (BMI) 40.0 and over, adult: Secondary | ICD-10-CM | POA: Diagnosis not present

## 2023-04-03 DIAGNOSIS — F988 Other specified behavioral and emotional disorders with onset usually occurring in childhood and adolescence: Secondary | ICD-10-CM | POA: Diagnosis not present

## 2023-04-03 DIAGNOSIS — T8149XA Infection following a procedure, other surgical site, initial encounter: Secondary | ICD-10-CM | POA: Diagnosis not present

## 2023-04-03 DIAGNOSIS — M170 Bilateral primary osteoarthritis of knee: Secondary | ICD-10-CM | POA: Diagnosis not present

## 2023-04-03 DIAGNOSIS — M47816 Spondylosis without myelopathy or radiculopathy, lumbar region: Secondary | ICD-10-CM | POA: Diagnosis not present

## 2023-04-03 DIAGNOSIS — I080 Rheumatic disorders of both mitral and aortic valves: Secondary | ICD-10-CM | POA: Diagnosis not present

## 2023-04-03 DIAGNOSIS — I428 Other cardiomyopathies: Secondary | ICD-10-CM | POA: Diagnosis not present

## 2023-04-03 DIAGNOSIS — I152 Hypertension secondary to endocrine disorders: Secondary | ICD-10-CM | POA: Diagnosis not present

## 2023-04-03 DIAGNOSIS — E1159 Type 2 diabetes mellitus with other circulatory complications: Secondary | ICD-10-CM | POA: Diagnosis not present

## 2023-04-03 DIAGNOSIS — I252 Old myocardial infarction: Secondary | ICD-10-CM | POA: Diagnosis not present

## 2023-04-03 DIAGNOSIS — G4733 Obstructive sleep apnea (adult) (pediatric): Secondary | ICD-10-CM | POA: Diagnosis not present

## 2023-04-03 DIAGNOSIS — G8929 Other chronic pain: Secondary | ICD-10-CM | POA: Diagnosis not present

## 2023-04-03 DIAGNOSIS — M51369 Other intervertebral disc degeneration, lumbar region without mention of lumbar back pain or lower extremity pain: Secondary | ICD-10-CM | POA: Diagnosis not present

## 2023-04-03 DIAGNOSIS — L89151 Pressure ulcer of sacral region, stage 1: Secondary | ICD-10-CM | POA: Diagnosis not present

## 2023-04-03 DIAGNOSIS — F333 Major depressive disorder, recurrent, severe with psychotic symptoms: Secondary | ICD-10-CM | POA: Diagnosis not present

## 2023-04-03 DIAGNOSIS — G43909 Migraine, unspecified, not intractable, without status migrainosus: Secondary | ICD-10-CM | POA: Diagnosis not present

## 2023-04-03 DIAGNOSIS — B9689 Other specified bacterial agents as the cause of diseases classified elsewhere: Secondary | ICD-10-CM | POA: Diagnosis not present

## 2023-04-03 DIAGNOSIS — M4800 Spinal stenosis, site unspecified: Secondary | ICD-10-CM | POA: Diagnosis not present

## 2023-04-03 DIAGNOSIS — D539 Nutritional anemia, unspecified: Secondary | ICD-10-CM | POA: Diagnosis not present

## 2023-04-03 DIAGNOSIS — E669 Obesity, unspecified: Secondary | ICD-10-CM | POA: Diagnosis not present

## 2023-04-03 DIAGNOSIS — E785 Hyperlipidemia, unspecified: Secondary | ICD-10-CM | POA: Diagnosis not present

## 2023-04-03 DIAGNOSIS — I4901 Ventricular fibrillation: Secondary | ICD-10-CM | POA: Diagnosis not present

## 2023-04-04 ENCOUNTER — Telehealth: Payer: Self-pay

## 2023-04-04 NOTE — Telephone Encounter (Signed)
 Zilretta  is denied by patients insurance for both BUY and BILL and PHARMACY benefits.   This will not be approved through BUY and BILL, but could be approved through PHARMACY benefits if patient patient tries and fails Dexamethasone  Sodium Phosphate  (solution 10mg /ml; 4mg /ml; syringe 4mg /ml) as this drug is indicated to treat the osteoarthritis condition under patients plan.   Document scanned

## 2023-04-04 NOTE — Telephone Encounter (Signed)
 MONOVISC authorized for right knee The patient is a member of the [employer] plan  which coordinates with their secondary medicaid plan to reduce or eliminate patient financial responsibility. Medicaid will coordinate benefits with medicare and cover up to the remaining 20% coinsurance and Part B deductible PA not required  Document scanned

## 2023-04-07 ENCOUNTER — Ambulatory Visit (INDEPENDENT_AMBULATORY_CARE_PROVIDER_SITE_OTHER): Payer: Medicare Other | Admitting: Student

## 2023-04-07 ENCOUNTER — Telehealth: Payer: Self-pay | Admitting: Cardiology

## 2023-04-07 ENCOUNTER — Encounter: Payer: Self-pay | Admitting: Student

## 2023-04-07 ENCOUNTER — Other Ambulatory Visit (HOSPITAL_COMMUNITY): Payer: Medicare Other

## 2023-04-07 ENCOUNTER — Telehealth: Payer: Self-pay

## 2023-04-07 ENCOUNTER — Encounter: Payer: Self-pay | Admitting: Cardiology

## 2023-04-07 VITALS — BP 99/54 | HR 91 | Temp 97.9°F

## 2023-04-07 DIAGNOSIS — F333 Major depressive disorder, recurrent, severe with psychotic symptoms: Secondary | ICD-10-CM | POA: Diagnosis not present

## 2023-04-07 DIAGNOSIS — E1159 Type 2 diabetes mellitus with other circulatory complications: Secondary | ICD-10-CM | POA: Diagnosis not present

## 2023-04-07 DIAGNOSIS — S81802D Unspecified open wound, left lower leg, subsequent encounter: Secondary | ICD-10-CM

## 2023-04-07 DIAGNOSIS — B9689 Other specified bacterial agents as the cause of diseases classified elsewhere: Secondary | ICD-10-CM | POA: Diagnosis not present

## 2023-04-07 DIAGNOSIS — I152 Hypertension secondary to endocrine disorders: Secondary | ICD-10-CM | POA: Diagnosis not present

## 2023-04-07 DIAGNOSIS — I5042 Chronic combined systolic (congestive) and diastolic (congestive) heart failure: Secondary | ICD-10-CM | POA: Diagnosis not present

## 2023-04-07 DIAGNOSIS — T8149XA Infection following a procedure, other surgical site, initial encounter: Secondary | ICD-10-CM | POA: Diagnosis not present

## 2023-04-07 NOTE — Progress Notes (Signed)
   Referring Provider [name], MD [address] [place],  [place] [address]   CC:  Chief Complaint  Patient presents with   Post-op Follow-up    1      [name] is an 58 y.o. female.  HPI: Patient is a 58 year old female with history of a large wound to her left lower extremity. She underwent debridement of the left lower extremity wound with placement of myriad tissue matrix with Dr. Ladona Ridgel on 02/06/2023 initially. She then underwent another wound debridement and placement of tissue replacement matrix with Dr. Ladona Ridgel on 02/24/2023. She presents to the clinic today for further follow up on her left lower extremity wound.   Patient was last seen in the clinic on 03/31/2023.  At this visit, patient was doing okay.  On exam, tendon was exposed, there was healthy appearing granulation tissue throughout the wound.  Plan was to take patient to surgery the week of 04/15/2023 after she received several days of IV antibiotics.  Today, patient reports with her husband at bedside.  She states that she is doing well.  She reports she still has some tightness to her left lower extremity.  She states she has not really been massaging her leg.  She denies any fevers or chills.  She reports she received the PICC line last week and has been receiving antibiotics since then.  She also states that she has an appointment with pain management and infectious disease later this week.  Review of Systems General: Denies any fevers or chills  Physical Exam    04/07/2023    1:18 PM 04/02/2023    1:00 PM 04/02/2023   11:01 AM  Vitals with BMI  Systolic 99 98 120  Diastolic 54 67 78  Pulse 91 68 77    General:  No acute distress,  Alert and oriented, Non-Toxic, Normal speech and affect On exam, patient is sitting upright in no acute distress.  Dressings to the wound are noted to have a green tinge noted but the dressings, especially noted centrally.  Upon removal of the dressings, tendon remains exposed.   There does appear to be some slough overlying the tendon.  There is healthy appearing granulation tissue throughout the wound, improved granulation tissue at the edges of the tendon.  There is also improved epithelialization at the periphery.  Desiccated myriad was noted to the superior aspect of the wound, this was cut and removed with scissors at bedside.  Patient tolerated well.  Assessment/Plan  Wound of left lower extremity, subsequent encounter   Recommended continue dressing changes as she has been.  Also recommended that she massage her scars in the tissue surrounding the wound at least once a day to help soften this up.  Patient expressed understanding.  Also encouraged to keep her follow-up with pain management and infectious disease.  Patient to go to the OR next week on 04/15/2023 with Dr. Ladona Ridgel.  Discussed with patient to not eat anything after midnight the night before her surgery.  Discussed medications to hold the morning of surgery.  Patient expressed understanding.  Instructed patient to call if she has any questions or concerns about anything.  Pictures were obtained of the patient and placed in the chart with the patient's or guardian's permission.   [name] 04/07/2023, 2:00 PM

## 2023-04-07 NOTE — Telephone Encounter (Signed)
 Called and spoke with [facility] nurse. She states that patient has increasing upper and lower extremity swelling. She states that patient is compliant with Lasix  40 mg daily for history of heart failure. Had vfib arrest couple of months ago and wears life vest. Currently getting IV antibiotic continuous home infusion of 100 ml bag over 24 hours. She is followed by Dr Shereen Dike in infectious disease as she has an infected IO wound from EMS placement with cardiac arrest. Called and spoke with patient and wife and advised to come to DOD appointment tomorrow at 10:45 am.

## 2023-04-07 NOTE — Telephone Encounter (Signed)
 Called and spoke with both patient and spouse. They stated patient has increase bil feet and lower leg swelling and some milder swelling in bil hands. C/o of tingling from neuropathy in legs and hands as well has hx of diabetes.  Also states she has wound on her leg. They wanted to see Dr Lavonne Prairie today and then I explained that he is out of the [place]. Offered her an appointment today with an APP. Then is when he told me well her home health nurse will be here soon for her iv fluids. I questioned what they were talking about and I was informed that she has a leg wound that is infected and another physician is managing her home infusions of iv fluids and iv antibiotics. I then advised that the home health nurse needs to continue her visit with patient and do a full assessment and report back to the ordering/managing md. They need to be the ones to manage her side effects from the infusions. I informed them that after the [facility] completed her visit and after the appointment they have this afternoon at the wound care clinic if they still needed assistance to please call back but contact the treating physician first. Both voiced understanding. '

## 2023-04-07 NOTE — Telephone Encounter (Signed)
 Pt c/o swelling: STAT is pt has developed SOB within 24 hours  How much weight have you gained and in what time span? Unsure   If swelling, where is the swelling located? Legs and hands   Are you currently taking a fluid pill? Yes   Are you currently SOB? No   Do you have a log of your daily weights (if so, list)?   Have you gained 3 pounds in a day or 5 pounds in a week?   Have you traveled recently? No   Home Health Nurse calling to give update on patient who has swelling. States she has no other symptoms currently, but has IV antibiotics and is being monitor for other issues. Requesting call back to see if patient should have medication adjusted.

## 2023-04-08 ENCOUNTER — Other Ambulatory Visit: Payer: Self-pay

## 2023-04-08 ENCOUNTER — Encounter: Payer: Self-pay | Admitting: Cardiovascular Disease

## 2023-04-08 ENCOUNTER — Ambulatory Visit (INDEPENDENT_AMBULATORY_CARE_PROVIDER_SITE_OTHER): Payer: Medicare Other | Admitting: Internal Medicine

## 2023-04-08 ENCOUNTER — Ambulatory Visit: Payer: Medicare Other | Attending: Cardiovascular Disease | Admitting: Cardiovascular Disease

## 2023-04-08 VITALS — BP 96/73 | HR 86 | Ht 64.0 in | Wt 256.0 lb

## 2023-04-08 VITALS — BP 127/82 | HR 86 | Temp 98.0°F | Wt 259.0 lb

## 2023-04-08 DIAGNOSIS — I428 Other cardiomyopathies: Secondary | ICD-10-CM

## 2023-04-08 DIAGNOSIS — S81802D Unspecified open wound, left lower leg, subsequent encounter: Secondary | ICD-10-CM

## 2023-04-08 DIAGNOSIS — Z87891 Personal history of nicotine dependence: Secondary | ICD-10-CM | POA: Diagnosis not present

## 2023-04-08 DIAGNOSIS — L089 Local infection of the skin and subcutaneous tissue, unspecified: Secondary | ICD-10-CM | POA: Diagnosis not present

## 2023-04-08 DIAGNOSIS — I5022 Chronic systolic (congestive) heart failure: Secondary | ICD-10-CM

## 2023-04-08 DIAGNOSIS — Z880 Allergy status to penicillin: Secondary | ICD-10-CM | POA: Diagnosis not present

## 2023-04-08 DIAGNOSIS — I509 Heart failure, unspecified: Secondary | ICD-10-CM

## 2023-04-08 MED ORDER — FUROSEMIDE 40 MG PO TABS: ORAL_TABLET | ORAL | 1 refills | Status: DC

## 2023-04-08 NOTE — Patient Instructions (Addendum)
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

## 2023-04-08 NOTE — Assessment & Plan Note (Signed)
Long history of nonischemic cardiomyopathy with an EF of 30% range.  She had VF arrest October 10 and was resuscitated.  She had IO line placed and developed infection and has been on systemic antibiotics since with wound debridement.  She is on an ARB and a beta-blocker.  She denies shortness of breath.  She is also wearing a LifeVest with intent to transition to an ICD when she is infection free.  She comes in today as an urgent add-on for what she describes as feeling bloated".  She says that her fingers feel somewhat swollen as to her feet.  Her exam is benign.  Her lungs are clear.  There is no JVD.  She has minimal edema clinically.  She is on furosemide 40 mg once a day.  I am going to increase this to twice daily for 3 days and back to once a day and we will have her see an APP back in 4 weeks in follow-up.

## 2023-04-08 NOTE — Patient Instructions (Signed)
Medication Instructions:  Your physician has recommended you make the following change in your medication:  -Take furosemide (lasix) 40mg  twice daily for 3 days, then resume 40mg  once daily.  *If you need a refill on your cardiac medications before your next appointment, please call your pharmacy*   Follow-Up: At [facility], you and your health needs are our priority.  As part of our continuing mission to provide you with exceptional heart care, we have created designated Provider Care Teams.  These Care Teams include your primary Cardiologist (physician) and Advanced Practice Providers (APPs -  Physician Assistants and Nurse Practitioners) who all work together to provide you with the care you need, when you need it.  We recommend signing up for the patient portal called "MyChart".  Sign up information is provided on this After Visit Summary.  MyChart is used to connect with patients for Virtual Visits (Telemedicine).  Patients are able to view lab/test results, encounter notes, upcoming appointments, etc.  Non-urgent messages can be sent to your provider as well.   To learn more about what you can do with MyChart, go to [email or web address].    Your next appointment:   4 week(s)  Provider:   Edd Fabian, FNP, Rito Ehrlich, or Joni Reining, DNP, ANP        Then, [name], MD will plan to see you again in 6 month(s).   Other Instructions

## 2023-04-08 NOTE — Progress Notes (Signed)
04/08/2023 [name]   1965/09/15  [id number]  Primary Physician [name], MD Primary Cardiologist: Runell Gess MD Nicholes Calamity, [place]  HPI:  [name] is a 58 y.o. severely overweight married female accompanied by her [name] today.  She is a patient of Dr. Jenene Slicker.  She has a history of a nonischemic cardiomyopathy for many years having undergone a VF arrest on October 10 of last year.  She is was resuscitated.  IV line was placed resulting in infection requiring ongoing systemic antibiotics through a PICC line.  She was placed on a LifeVest with intent to transition to a ICD once she is infection free.  She is on appropriate medications.  She comes in today because of complaints of feeling "swollen"..  She denies shortness of breath.  She walks with a walker.  Her she was getting IV antibiotics via PICC line during her office visit.   Current Meds  Medication Sig   Accu-Chek Softclix Lancets lancets Use as instructed   acetaminophen (TYLENOL) 325 MG tablet Take 1-2 tablets (325-650 mg total) by mouth every 4 (four) hours as needed for mild pain (pain score 1-3).   AMBULATORY NON FORMULARY MEDICATION Off-loading wheel chair seat cusion Dispense 1 Dx code: M53.3 Use as needed   atorvastatin (LIPITOR) 80 MG tablet Take 1 tablet (80 mg total) by mouth daily.   Blood Glucose Monitoring Suppl DEVI 1 each by Does not apply route in the morning, at noon, and at bedtime. May substitute to any manufacturer covered by patient's insurance.   Boric Acid Vaginal 600 MG SUPP Place 600 mg vaginally at bedtime.   collagenase (SANTYL) 250 UNIT/GM ointment Apply 1 Application topically daily.   dapagliflozin propanediol (FARXIGA) 10 MG TABS tablet Take 1 tablet (10 mg total) by mouth daily.   fluticasone (FLONASE) 50 MCG/ACT nasal spray Place 2 sprays into both nostrils daily. (Patient taking differently: Place 2 sprays into both nostrils daily as needed for rhinitis.)    furosemide (LASIX) 40 MG tablet Take 1 tablet (40 mg total) by mouth daily.   gentian violet (GNP GENTIAN VIOLET) 1 % topical solution Apply topically to affected area every 2-3 days as needed. May wash off in between applications.   Glucose Blood (BLOOD GLUCOSE TEST STRIPS) STRP 1 each by In Vitro route in the morning, at noon, and at bedtime. May substitute to any manufacturer covered by patient's insurance.   hydrOXYzine (ATARAX) 25 MG tablet Take 1 tablet (25 mg total) by mouth 3 (three) times daily as needed.   ketoconazole (NIZORAL) 2 % cream Apply 1 Application topically daily. Apply 1gm to skin on plantar aspect of affected foot, and massage in well.  Apply once daily. (Patient taking differently: Apply 1 Application topically daily as needed. Apply 1gm to skin on plantar aspect of affected foot, and massage in well.  Apply once daily.)   LORazepam (ATIVAN) 0.5 MG tablet Take 1 tablet (0.5 mg total) by mouth 2 (two) times daily as needed for anxiety.   losartan (COZAAR) 25 MG tablet Take 0.5 tablets (12.5 mg total) by mouth daily.   magnesium oxide (MAG-OX) 400 MG tablet Take 1 tablet (400 mg total) by mouth at bedtime.   metFORMIN (GLUCOPHAGE-XR) 500 MG 24 hr tablet Take 1 tablet (500 mg total) by mouth 2 (two) times daily with a meal.   metoprolol succinate (TOPROL-XL) 50 MG 24 hr tablet Take 1 tablet (50 mg total) by mouth daily. Take with or  immediately following a meal.   nutrition supplement, JUVEN, (JUVEN) PACK Take 1 packet by mouth 2 (two) times daily between meals.   Oxycodone HCl 10 MG TABS Take 1 tablet (10 mg total) by mouth 3 (three) times daily as needed. (Patient taking differently: Take 5-10 mg by mouth as needed (pain).)   piperacillin-tazobactam (ZOSYN) 4.5 (4-0.5) g injection Inject 10 g into the vein over 24 hr.   polyethylene glycol powder (GLYCOLAX/MIRALAX) 17 GM/SCOOP powder Take 1 capful (17 g) in water 2 (two) times daily.   potassium chloride SA (KLOR-CON M) 20 MEQ  tablet Take 1 tablet (20 mEq total) by mouth daily.   senna-docusate (SENOKOT-S) 8.6-50 MG tablet Take 2 tablets by mouth at bedtime.   spironolactone (ALDACTONE) 25 MG tablet Take 0.5 tablets (12.5 mg total) by mouth daily.   topiramate (TOPAMAX) 25 MG tablet Take 1 tablet (25 mg total) by mouth at bedtime.   vortioxetine HBr (TRINTELLIX) 5 MG TABS tablet Take 1 tablet (5 mg total) by mouth daily.     Allergies  Allergen Reactions   Fluoxetine Other (See Comments)    More depressed   Cymbalta [Duloxetine Hcl] Other (See Comments)    depressed   Pork-Derived Products Other (See Comments)    Religious beliefs     Social History   Socioeconomic History   Marital status: Married    Spouse name: [name]   Number of children: 1   Years of education: 16   Highest education level: Associate degree: academic program  Occupational History   Not on file  Tobacco Use   Smoking status: Former    Current packs/day: 0.00    Types: Cigarettes    Quit date: 02/25/1997    Years since quitting: 26.1   Smokeless tobacco: Never   Tobacco comments:    Married, lives with spouse (when he is not traveling) and son  Vaping Use   Vaping status: Never Used  Substance and Sexual Activity   Alcohol use: No   Drug use: No   Sexual activity: Not Currently    Partners: Male    Birth control/protection: None  Other Topics Concern   Not on file  Social History Narrative   Right handed      Two story home   Lives with [name]   No caffeine   Disablitiy    Social Drivers of Corporate investment banker Strain: Low Risk  (02/10/2023)   Overall Financial Resource Strain (CARDIA)    Difficulty of Paying Living Expenses: Not hard at all  Food Insecurity: No Food Insecurity (02/10/2023)   Hunger Vital Sign    Worried About Running Out of Food in the Last Year: Never true    Ran Out of Food in the Last Year: Never true  Transportation Needs: No Transportation Needs (02/10/2023)   PRAPARE -  Administrator, Civil Service (Medical): No    Lack of Transportation (Non-Medical): No  Physical Activity: Inactive (03/18/2023)   Exercise Vital Sign    Days of Exercise per Week: 0 days    Minutes of Exercise per Session: 0 min  Stress: Stress Concern Present (03/18/2023)   [employer] of Occupational Health - Occupational Stress Questionnaire    Feeling of Stress : Very much  Social Connections: Socially Isolated (02/10/2023)   Social Connection and Isolation Panel [NHANES]    Frequency of Communication with Friends and Family: Once a week    Frequency of Social Gatherings with Friends and Family:  Once a week    Attends Religious Services: Never    Active Member of Clubs or Organizations: No    Attends Banker Meetings: Never    Marital Status: Married  Catering manager Violence: Not At Risk (02/10/2023)   Humiliation, Afraid, Rape, and Kick questionnaire    Fear of Current or Ex-Partner: No    Emotionally Abused: No    Physically Abused: No    Sexually Abused: No     Review of Systems: General: negative for chills, fever, night sweats or weight changes.  Cardiovascular: negative for chest pain, dyspnea on exertion, edema, orthopnea, palpitations, paroxysmal nocturnal dyspnea or shortness of breath Dermatological: negative for rash Respiratory: negative for cough or wheezing Urologic: negative for hematuria Abdominal: negative for nausea, vomiting, diarrhea, bright red blood per rectum, melena, or hematemesis Neurologic: negative for visual changes, syncope, or dizziness All other systems reviewed and are otherwise negative except as noted above.    Blood pressure 96/73, pulse 86, height 5\' 4"  (1.626 m), weight 256 lb (116.1 kg), SpO2 95%.  General appearance: alert and no distress Neck: no adenopathy, no carotid bruit, no JVD, supple, symmetrical, trachea midline, and thyroid not enlarged, symmetric, no tenderness/mass/nodules Lungs: clear to  auscultation bilaterally Heart: regular rate and rhythm, S1, S2 normal, no murmur, click, rub or gallop Extremities: extremities normal, atraumatic, no cyanosis or edema Pulses: 2+ and symmetric Skin: Skin color, texture, turgor normal. No rashes or lesions Neurologic: Grossly normal  EKG not performed today      ASSESSMENT AND PLAN:   NICM (nonischemic cardiomyopathy) (HCC) Long history of nonischemic cardiomyopathy with an EF of 30% range.  She had VF arrest October 10 and was resuscitated.  She had IO line placed and developed infection and has been on systemic antibiotics since with wound debridement.  She is on an ARB and a beta-blocker.  She denies shortness of breath.  She is also wearing a LifeVest with intent to transition to an ICD when she is infection free.  She comes in today as an urgent add-on for what she describes as feeling bloated".  She says that her fingers feel somewhat swollen as to her feet.  Her exam is benign.  Her lungs are clear.  There is no JVD.  She has minimal edema clinically.  She is on furosemide 40 mg once a day.  I am going to increase this to twice daily for 3 days and back to once a day and we will have her see an APP back in 4 weeks in follow-up.     Runell Gess MD FACP,FACC,FAHA, [facility] 04/08/2023 12:05 PM

## 2023-04-08 NOTE — Progress Notes (Signed)
Patient Active Problem List   Diagnosis Date Noted   Memory impairment 03/14/2023   Morbid obesity (HCC) 02/18/2023   Medication management 02/13/2023   Penicillin allergy 02/13/2023   Wound of left lower extremity 02/06/2023   S/P debridement 02/06/2023   Dysuria 01/31/2023   Encounter for palliative care involving management of pain 01/28/2023   Acute post-traumatic wound infection 01/27/2023   Nasal congestion 01/16/2023   Deficiency anemia 01/15/2023   Need for immunization against influenza 01/15/2023   Diabetes mellitus (HCC) 12/19/2022   Ventricular fibrillation (HCC) 12/10/2022   Onychomycosis 11/15/2022   Anxiety 10/27/2022   Rosacea, acne 08/13/2022   Primary osteoarthritis of both knees 03/06/2022   Bilateral ovarian cysts 03/06/2022   Major depressive disorder, recurrent episode, severe with anxious distress (HCC) 08/09/2021   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   Need for varicella vaccine 08/09/2021   Urge incontinence 08/01/2021   Tinea corporis 03/14/2021   Hyperlipidemia LDL goal <100 03/01/2021   DDD (degenerative disc disease), lumbar 08/08/2020   Non-seasonal allergic rhinitis due to pollen 07/25/2020   Chronic idiopathic constipation 07/25/2020   Visit for screening mammogram 02/11/2020   Chronic bilateral low back pain without sciatica 02/10/2020   Spondylosis without myelopathy or radiculopathy, lumbar region 12/04/2017   Type 2 diabetes mellitus with hyperglycemia (HCC)    Normal coronary arteries 04/17/2016   OSA on CPAP 10/28/2014   Chronic combined systolic and diastolic heart failure (HCC) 12/06/2013   Hyperlipidemia associated with type 2 diabetes mellitus (HCC), on Zocor 04/04/2011   Hypertension associated with diabetes (HCC) 06/03/2008   Morbid obesity with BMI of 50.0-59.9, adult (HCC) 07/12/2006   NICM (nonischemic cardiomyopathy) (HCC) 07/12/2006   Migraine without status migrainosus, not intractable  07/12/2006    Patient's Medications  New Prescriptions   No medications on file  Previous Medications   ACCU-CHEK SOFTCLIX LANCETS LANCETS    Use as instructed   ACETAMINOPHEN (TYLENOL) 325 MG TABLET    Take 1-2 tablets (325-650 mg total) by mouth every 4 (four) hours as needed for mild pain (pain score 1-3).   AMBULATORY NON FORMULARY MEDICATION    Off-loading wheel chair seat cusion Dispense 1 Dx code: M53.3 Use as needed   ATORVASTATIN (LIPITOR) 80 MG TABLET    Take 1 tablet (80 mg total) by mouth daily.   BLOOD GLUCOSE MONITORING SUPPL DEVI    1 each by Does not apply route in the morning, at noon, and at bedtime. May substitute to any manufacturer covered by patient's insurance.   BORIC ACID VAGINAL 600 MG SUPP    Place 600 mg vaginally at bedtime.   COLLAGENASE (SANTYL) 250 UNIT/GM OINTMENT    Apply 1 Application topically daily.   DAPAGLIFLOZIN PROPANEDIOL (FARXIGA) 10 MG TABS TABLET    Take 1 tablet (10 mg total) by mouth daily.   FLUTICASONE (FLONASE) 50 MCG/ACT NASAL SPRAY    Place 2 sprays into both nostrils daily.   FUROSEMIDE (LASIX) 40 MG TABLET    Take 40mg  twice daily for 3 days then 40mg  daily.   GENTIAN VIOLET (GNP GENTIAN VIOLET) 1 % TOPICAL SOLUTION    Apply topically to affected area every 2-3 days as needed. May wash off in between applications.   GLUCOSE BLOOD (BLOOD GLUCOSE TEST STRIPS) STRP    1 each by In Vitro route in the morning, at noon, and at bedtime. May substitute to any manufacturer covered by patient's insurance.   HYDROXYZINE (ATARAX) 25 MG TABLET  Take 1 tablet (25 mg total) by mouth 3 (three) times daily as needed.   KETOCONAZOLE (NIZORAL) 2 % CREAM    Apply 1 Application topically daily. Apply 1gm to skin on plantar aspect of affected foot, and massage in well.  Apply once daily.   LORAZEPAM (ATIVAN) 0.5 MG TABLET    Take 1 tablet (0.5 mg total) by mouth 2 (two) times daily as needed for anxiety.   LOSARTAN (COZAAR) 25 MG TABLET    Take 0.5 tablets  (12.5 mg total) by mouth daily.   MAGNESIUM OXIDE (MAG-OX) 400 MG TABLET    Take 1 tablet (400 mg total) by mouth at bedtime.   METFORMIN (GLUCOPHAGE-XR) 500 MG 24 HR TABLET    Take 1 tablet (500 mg total) by mouth 2 (two) times daily with a meal.   METOPROLOL SUCCINATE (TOPROL-XL) 50 MG 24 HR TABLET    Take 1 tablet (50 mg total) by mouth daily. Take with or immediately following a meal.   MULTIPLE VITAMIN (MULTIVITAMIN WITH MINERALS) TABS TABLET    Take 1 tablet by mouth daily.   NON FORMULARY    Pt uses cpap nightly   NUTRITION SUPPLEMENT, JUVEN, (JUVEN) PACK    Take 1 packet by mouth 2 (two) times daily between meals.   ONDANSETRON (ZOFRAN-ODT) 4 MG DISINTEGRATING TABLET    Take 1 tablet (4 mg total) by mouth every 8 (eight) hours as needed for nausea or vomiting.   OXYCODONE HCL 10 MG TABS    Take 1 tablet (10 mg total) by mouth 3 (three) times daily as needed.   PIPERACILLIN-TAZOBACTAM (ZOSYN) 4.5 (4-0.5) G INJECTION    Inject 10 g into the vein over 24 hr.   POLYETHYLENE GLYCOL POWDER (GLYCOLAX/MIRALAX) 17 GM/SCOOP POWDER    Take 1 capful (17 g) in water 2 (two) times daily.   POTASSIUM CHLORIDE SA (KLOR-CON M) 20 MEQ TABLET    Take 1 tablet (20 mEq total) by mouth daily.   SENNA-DOCUSATE (SENOKOT-S) 8.6-50 MG TABLET    Take 2 tablets by mouth at bedtime.   SPIRONOLACTONE (ALDACTONE) 25 MG TABLET    Take 0.5 tablets (12.5 mg total) by mouth daily.   TOPIRAMATE (TOPAMAX) 25 MG TABLET    Take 1 tablet (25 mg total) by mouth at bedtime.   VORTIOXETINE HBR (TRINTELLIX) 5 MG TABS TABLET    Take 1 tablet (5 mg total) by mouth daily.  Modified Medications   No medications on file  Discontinued Medications   No medications on file    Subjective: 58 yo female hx advance heart failure, dm2, recent prolonged admission 12/05/22-12/30/22 for vf cardiac arrest and left knee IO catheter that complicated with chronic wound here for f/u wound infection  03/13/23 id clinic f/u Reviewed allergy progress  note --> pcn testing shows that she doesn't have pcn allergy Complains of nausea/heart burn with abx Leg wound improving per wound care but she still has lots of pain No diarrhea/rash otherwise   ------------- 02/28/23 id initial clinic visit with me I reviewed note from her last visit with dr Elinor Parkinson Pcn allergy -- urgent referral for allergy immunology in setting of actinomyces species on wound cx. Saw dr Murrell Converse 02/27/23 and planned pcn testing/desensitization soon Ortho/plastic seeing patient  01/27/23 seen by pcp and there is a ?swab wound cx that grew pseudomonas (cipro mic 0.5 sensitive; levo 2 resistant) 12/12 I&D of the wound by plastic -- myriad tissue matrix placed over exposed tendon. Operative cx e faecalis, actinomyces  species and actinomyces europea Patient had 2 round of ciprofloxacin 12/5 and 12/13; no side effect.  She had another I&D by plastic surgery 12/30.   Patient currently taking linezolid only and had finished cipro  I have no imaging for patient. Given the IO catheter use I worry there could be OM   I also reviewed operative note from 12/12 and 02/24/23 of dr Weyman Croon of plastic surgery 12/12: Didn't appear the debridement needs to go to the bone. Tendon exposed as mentioned above 12/30: Tissue healing but patella tendon remains exposed. I&D done and more tissue matrix placed over the tendon. No operative culture sent  She reports sharp/shooting pain in the area. No f/c. No n/v/d. No blurry vision. No numbness/tingling Will see plastic again in a few days  Doesn't have desensitization until 1/27  Also wears life vest now waiting for icd placement which await clearance of Infection    04/08/23 id clinic f/u Patient had picc placed 2/5 and started on piptazo; amoxicillin hold From previous discussion with plastic surgery would like to treat for pseudomonas colonization with abx in setting planned placement of graft over the exposed tendon.  She saw  cardiology a few days ago for increased peripheral swelling. No sign of overt heart failure (has non-ischemic cardiomyopathy); lasix increased to bid for a few days advised No fever, chill No n/v/diarrhea No rash  She does feel very constipated; she takes oxycodone. She has miralax   Review of Systems: all systems reviewed and negative  Past Medical History:  Diagnosis Date   ADD (attention deficit disorder)    Anxiety    Arthritis    both knees right worse than left   Carpal tunnel syndrome of right wrist    Depression    Essential hypertension, benign    Gallstones    History of smoking 06/22/2016   Hyperlipidemia associated with type 2 diabetes mellitus (HCC), on Zocor 04/04/2011   Hypertension associated with diabetes (HCC) 06/03/2008   Migraines    Morbid obesity (HCC)    Morbid obesity with BMI of 50.0-59.9, adult (HCC) 07/12/2006   NICM (nonischemic cardiomyopathy) (HCC) 07/12/2006   01/16/18 ECHO:    - Procedure narrative: Transthoracic echocardiography. Image   quality was suboptimal. The study was technically difficult.   Intravenous contrast (Definity) was administered. - Left ventricle: The cavity size was moderately dilated. Wall   thickness was increased in a pattern of mild LVH. Systolic   function was moderately to severely reduced. The estimated   ejection fraction w   Normal coronary arteries 04/17/2016   OSA on CPAP 10/28/2014   Spinal stenosis    back pain   Spondylosis without myelopathy or radiculopathy, lumbar region 12/04/2017   Type 2 diabetes mellitus with hyperglycemia [facility])    Past Surgical History:  Procedure Laterality Date   CHOLECYSTECTOMY     DILATATION & CURETTAGE/HYSTEROSCOPY WITH MYOSURE N/A 10/31/2017   Procedure: DILATATION & CURETTAGE/HYSTEROSCOPY WITH MYOSURE;  Surgeon: Romualdo Bolk, MD;  Location: [facility] ORS;  Service: Gynecology;  Laterality: N/A;   HYSTEROSCOPY WITH D & C N/A 05/07/2022   Procedure: DILATATION AND CURETTAGE  /HYSTEROSCOPY;  Surgeon: [name], MD;  Location: [facility] OR;  Service: Gynecology;  Laterality: N/A;   INCISION AND DRAINAGE OF WOUND Left 02/06/2023   Procedure: debridement of left leg wound, application of myriad;  Surgeon: [name], MD;  Location: [facility] OR;  Service: Plastics;  Laterality: Left;   INCISION AND DRAINAGE OF WOUND Left 02/24/2023  Procedure: application of tissue replacement matrix of left patellar tendon;  Surgeon: [name], MD;  Location: [facility] OR;  Service: Plastics;  Laterality: Left;   IRRIGATION AND DEBRIDEMENT KNEE  02/24/2023   Procedure: IRRIGATION AND DEBRIDEMENT KNEE;  Surgeon: [name], MD;  Location: [facility] OR;  Service: Plastics;;   life vest     RIGHT/LEFT HEART CATH AND CORONARY ANGIOGRAPHY N/A 04/11/2016   Procedure: Right/Left Heart Cath and Coronary Angiography;  Surgeon: Kathleene Hazel, MD;  Location: [facility] INVASIVE CV LAB;  Service: Cardiovascular;  Laterality: N/A;   RIGHT/LEFT HEART CATH AND CORONARY ANGIOGRAPHY N/A 12/09/2022   Procedure: RIGHT/LEFT HEART CATH AND CORONARY ANGIOGRAPHY;  Surgeon: Yates Decamp, MD;  Location: [facility] INVASIVE CV LAB;  Service: Cardiovascular;  Laterality: N/A;   sonogram for blood clots     no blockages     Social History   Tobacco Use   Smoking status: Former    Current packs/day: 0.00    Types: Cigarettes    Quit date: 02/25/1997    Years since quitting: 26.1   Smokeless tobacco: Never   Tobacco comments:    Married, lives with spouse (when he is not traveling) and son  Vaping Use   Vaping status: Never Used  Substance Use Topics   Alcohol use: No   Drug use: No    Family History  Problem Relation Age of Onset   Depression Mother    Anxiety disorder Mother    Diabetes Mother    Hypertension Mother    ADD / ADHD Father    Diabetes Father    Hypertension Father    Colitis Father    Hypertension Other    Diabetes Other    Colitis Other    Alcohol abuse Other     Allergies   Allergen Reactions   Fluoxetine Other (See Comments)    More depressed   Cymbalta [Duloxetine Hcl] Other (See Comments)    depressed   Pork-Derived Products Other (See Comments)    Religious beliefs     Health Maintenance  Topic Date Due   COVID-19 Vaccine (4 - 2024-25 season) 10/27/2022   FOOT EXAM  03/07/2023   Zoster Vaccines- Shingrix (1 of 2) 04/18/2023 (Originally [birth year]1984)   HEMOGLOBIN A1C  06/07/2023   MAMMOGRAM  07/09/2023   OPHTHALMOLOGY EXAM  08/09/2023   Colonoscopy  02/11/2026   Cervical Cancer Screening (HPV/Pap Cotest)  08/02/2026   DTaP/Tdap/Td (6 - Td or Tdap) 11/08/2027   Pneumococcal Vaccine 23-55 Years old  Completed   INFLUENZA VACCINE  Completed   Hepatitis C Screening  Completed   HIV Screening  Completed   HPV VACCINES  Aged Out    Objective: BP 127/82   Pulse 86   Temp 98 F (36.7 C) (Oral)   Wt 259 lb (117.5 kg)   LMP  (LMP Unknown)   SpO2 99%   BMI 44.46 kg/m   Physical Exam Constitutional:      Appearance: Normal appearance. Morbidly obese  HENT:     Head: Normocephalic and atraumatic.      Mouth: Mucous membranes are moist.  Eyes:    Conjunctiva/sclera: Conjunctivae normal.     Pupils: Pupils are equal, round, and b/l symmetrical   Cardiovascular:     Rate and Rhythm: Normal rate and regular rhythm.     Heart sounds: s1s2. She has a life vest.   Pulmonary:     Effort: Pulmonary effort is normal.     Breath sounds: Normal  breath sounds.   Abdominal:     General: Non distended     Palpations: soft.   Musculoskeletal:        General: sitting in the wheelchair  Skin: 02/28/23 picture from 12/30    03/10/23 photo    04/08/23 picture Compared to 2-3 weeks ago less area of greenish exudates. Surrounding the tendon are mostly granulating tissue; no necrotic areas seen    Rue picc site slight serosanguinous oozing   Lab Results Lab Results  Component Value Date   WBC 8.9 02/28/2023   HGB 14.1 02/28/2023   HCT 44.5  02/28/2023   MCV 85.2 02/28/2023   PLT 284 02/28/2023    Lab Results  Component Value Date   CREATININE 1.01 02/28/2023   BUN 21 02/28/2023   NA 140 02/28/2023   K 4.1 02/28/2023   CL 100 02/28/2023   CO2 24 02/28/2023    Lab Results  Component Value Date   ALT 30 (H) 02/28/2023   AST 26 02/28/2023   ALKPHOS 42 12/18/2022   BILITOT 1.1 02/28/2023    Lab Results  Component Value Date   CHOL 194 08/13/2022   HDL 48.90 08/13/2022   LDLCALC 117 (H) 08/13/2022   LDLDIRECT 143.8 04/03/2011   TRIG 208 (H) 12/06/2022   CHOLHDL 4 08/13/2022      Component Value Date/Time   CRP 19.3 (H) 02/28/2023 1139   CRP 28.6 (H) 02/13/2023 0257   CRP 2.8 (H) 12/16/2022 1820     Imaging: Reviewed  11/2022 tte  1. Left ventricular ejection fraction, by estimation, is 30 to 35%. The  left ventricle has moderately decreased function. The left ventricle  demonstrates global hypokinesis. There is mild left ventricular  hypertrophy. Indeterminate diastolic filling due   to E-A fusion.   2. Right ventricular systolic function is normal. The right ventricular  size is normal.   3. The mitral valve is normal in structure. Trivial mitral valve  regurgitation.   4. The aortic valve is tricuspid. Aortic valve regurgitation is not  visualized.   5. The inferior vena cava IVC not well visualized.   Comparison(s): No significant change from prior study.       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  Culture: -01/27/23 is a clinic superficial swab --> pseudomonas (S cipro; R levo) -- I do not know the significance of this superficial cx (open wound is often colonized and pseudomonas usually do not play a pathogenic role) -12/12 I&D culture E faecalis, Actinomyces spp, Actinomyces europaea.   Tx course: 12/12 I&D and matrix application to  tendon 12/30 another matrix application -- wound looks healthy otherwise Abx: Finished 2 courses of cipro 7 day at a time by 12/17 Zyvox started 12/19   -continue zyvox for now in setting of actinomyces/e faecalis (the latter might not be pathogenic either). Doxy possible if needs bridging but want to start amoxicillin -surgery the heavy lifting has been done but would like to treat with amoxicillin for at least 3 months in setting actinomyces found -labs today -f/u 2 weeks -chart sent to dr Wyline Mood requesting sooner pcn testing/desensitization if needed   # Penicillin allergy  Saw allergy and planned desensitization/testing in a few weeks  -would like to see if this can be done sooner say in 1-2 weeks and I'll send message to dr Wyline Mood today (  so ICD can be considered and less toxic antibiotics can be used)     # Chronic HFrEF/NICM, VF Cardiac arrest  Fu with cardiology  Plan for ICD once left leg wound heals  -from id standpoint today, agree healed wound would be least risky given portal of entry. But if more urgent icd placement needed, I am ok with placing it once she is on amoxicillin (this would make it easier for hospital admission to transition to iv penicillin, and also by the time we transition her to penicillin, will know for sure the insignificance of pseudomonas found on superficial tissue culture   03/13/23 id clinic assessment She still wears life vest The wound looks clean from 1/13 and healthy granulating tissue No evidence pcn allergy based on oral challenge and skin prick test by allergy clinic  Stop zyvox today Start amox 750 mg tid; plan at least 3-6 months  She doesn't want iv abx  F/u 4 weeks for labs  F/u plastic surgery    04/08/23 id clinic assessment Patient has 04/15/23 planned grafting of the exposed tendon Exam suggest decreased burden of pseudomonas colonization after a week of piptazo Advised increased volume retention anticipated with  her chf and piptazo F/u cardiology as needed Daily weight. Aim for stable weight (after stool catharsis) and use extra dose lasix as needed if daily weight increases by more than 1-2 pounds Continue piptazo Plan at least 2 weeks piptazo beyond surgical grafting for pseudomonas, but the actinomyces will again need several months treatment and likely will transition back to amoxicillin abx  Will await pending opat labs  It is reasonable to send deep tissue culture (aerobic/anaerobic) again periop skin grafting  Chart forwarded to plastic surgery team and chf team   F/u 1-2 weeks after grafting done    Raymondo Band, MD [facility] 04/08/2023, 1:38 PM

## 2023-04-10 DIAGNOSIS — T8149XA Infection following a procedure, other surgical site, initial encounter: Secondary | ICD-10-CM | POA: Diagnosis not present

## 2023-04-10 DIAGNOSIS — I152 Hypertension secondary to endocrine disorders: Secondary | ICD-10-CM | POA: Diagnosis not present

## 2023-04-10 DIAGNOSIS — B9689 Other specified bacterial agents as the cause of diseases classified elsewhere: Secondary | ICD-10-CM | POA: Diagnosis not present

## 2023-04-10 DIAGNOSIS — F333 Major depressive disorder, recurrent, severe with psychotic symptoms: Secondary | ICD-10-CM | POA: Diagnosis not present

## 2023-04-10 DIAGNOSIS — E1159 Type 2 diabetes mellitus with other circulatory complications: Secondary | ICD-10-CM | POA: Diagnosis not present

## 2023-04-10 DIAGNOSIS — I5042 Chronic combined systolic (congestive) and diastolic (congestive) heart failure: Secondary | ICD-10-CM | POA: Diagnosis not present

## 2023-04-11 ENCOUNTER — Telehealth: Payer: Self-pay | Admitting: Physician Assistant

## 2023-04-11 ENCOUNTER — Telehealth: Payer: Self-pay | Admitting: Cardiology

## 2023-04-11 ENCOUNTER — Encounter (HOSPITAL_BASED_OUTPATIENT_CLINIC_OR_DEPARTMENT_OTHER): Payer: Medicare Other | Admitting: Internal Medicine

## 2023-04-11 ENCOUNTER — Encounter (HOSPITAL_BASED_OUTPATIENT_CLINIC_OR_DEPARTMENT_OTHER): Payer: Self-pay

## 2023-04-11 NOTE — Telephone Encounter (Signed)
Pt wants to know if she can take Preparation H for Hemmorroids

## 2023-04-11 NOTE — Telephone Encounter (Signed)
I advised patient to contact behavioral health and be placed on waiting list if in that much pain I would suggest ER.

## 2023-04-11 NOTE — Telephone Encounter (Signed)
Pavero, Cristal Deer, RPH  You5 minutes ago (10:49 AM)    Yes, ok to use  Patient identification verified by 2 forms. Marilynn Rail, RN    Called and spoke to patients husband [employer]  [name] verbalized understanding, no questions at this time

## 2023-04-11 NOTE — Telephone Encounter (Signed)
Mix up yesterday someone called and switched her appointment for tomorrow morning and she thought it was for pain management  and when they went to the appointment was not for pain management. Patient stated that pain management has her scheduled for May and that is too far out for someone in pain.   I informed patient hat we do not treat pain management here at our office and that is why sara has referred her to pain management. Patient stated that Huntley Dec told her we treat pain here, and I informed her that Huntley Dec has referred her out for treatment of her pain. I informed patient that if she is in pain how she is explaining she needs to go to the Emergency Department.   Phone call handed over to [place] for help in speaking to patient.

## 2023-04-11 NOTE — Telephone Encounter (Signed)
Patient called and states that she is in a lot of pain and needs to speak to someone

## 2023-04-14 ENCOUNTER — Encounter (HOSPITAL_COMMUNITY): Payer: Self-pay | Admitting: Plastic Surgery

## 2023-04-14 ENCOUNTER — Other Ambulatory Visit: Payer: Self-pay

## 2023-04-14 DIAGNOSIS — I5042 Chronic combined systolic (congestive) and diastolic (congestive) heart failure: Secondary | ICD-10-CM | POA: Diagnosis not present

## 2023-04-14 DIAGNOSIS — T148XXA Other injury of unspecified body region, initial encounter: Secondary | ICD-10-CM | POA: Diagnosis not present

## 2023-04-14 DIAGNOSIS — E1159 Type 2 diabetes mellitus with other circulatory complications: Secondary | ICD-10-CM | POA: Diagnosis not present

## 2023-04-14 DIAGNOSIS — F333 Major depressive disorder, recurrent, severe with psychotic symptoms: Secondary | ICD-10-CM | POA: Diagnosis not present

## 2023-04-14 DIAGNOSIS — T8149XA Infection following a procedure, other surgical site, initial encounter: Secondary | ICD-10-CM | POA: Diagnosis not present

## 2023-04-14 DIAGNOSIS — B9689 Other specified bacterial agents as the cause of diseases classified elsewhere: Secondary | ICD-10-CM | POA: Diagnosis not present

## 2023-04-14 DIAGNOSIS — I152 Hypertension secondary to endocrine disorders: Secondary | ICD-10-CM | POA: Diagnosis not present

## 2023-04-14 NOTE — Progress Notes (Signed)
PCP/Internal Med - Dr Sanda Linger Cardiologist - Dr [name] Infectious Diseases - Dr Rutha Bouchard  Chest x-ray - 12/10/22 EKG - 01/14/23 Stress Test - 09/25/17 ECHO - 12/07/22 Cardiac Cath - 12/09/22  ICD Pacemaker/Loop - n/a  Sleep Study -  Yes  CPAP - does not use CPAP  Diabetes Type 2  Do not take Metformin on the morning of surgery.  Hold Farxiga for 72 hours prior to procedure.  Last dose was on 04/13/23.  If your blood sugar is less than 70 mg/dL, you will need to treat for low blood sugar: Treat a low blood sugar (less than 70 mg/dL) with  cup of clear juice (cranberry or apple), 4 glucose tablets, OR glucose gel. Recheck blood sugar in 15 minutes after treatment (to make sure it is greater than 70 mg/dL). If your blood sugar is not greater than 70 mg/dL on recheck, call [phone] for further instructions.  Aspirin and Blood Thinner Instructions:  n/a  ERAS -clear liquids til 6 am DOS  Anesthesia review: Yes  STOP now taking any Aspirin (unless otherwise instructed by your surgeon), Aleve, Naproxen, Ibuprofen, Motrin, Advil, Goody's, BC's, all herbal medications, fish oil, and all vitamins.   Coronavirus Screening Do you have any of the following symptoms:  Cough yes/no: No Fever (>100.11F)  yes/no: No Runny nose yes/no: No Sore throat yes/no: No Difficulty breathing/shortness of breath  yes/no: No  Have you traveled in the last 14 days and where? yes/no: No  Patient's Husband [name] verbalized understanding of instructions that were given via phone.

## 2023-04-14 NOTE — Progress Notes (Signed)
Patient was called to be informed that the surgery time for tomorrow was changed to 13:30 o'clock. Patient's husband was instructed that the patient must be at the hospital tomorrow at 11:00 o'clock and she will stop drinking clear liquids at 10:30 o'clock. Husband verbalized understanding.

## 2023-04-14 NOTE — Anesthesia Preprocedure Evaluation (Signed)
Anesthesia Evaluation  Patient identified by MRN, date of birth, ID band Patient awake    Reviewed: Allergy & Precautions, H&P , NPO status , Patient's Chart, lab work & pertinent test results, reviewed documented beta blocker date and time   Airway Mallampati: III  TM Distance: >3 FB Neck ROM: Full    Dental no notable dental hx. (+) Teeth Intact, Dental Advisory Given   Pulmonary sleep apnea and Continuous Positive Airway Pressure Ventilation , former smoker   Pulmonary exam normal breath sounds clear to auscultation       Cardiovascular hypertension, Pt. on medications and Pt. on home beta blockers +CHF   Rhythm:Regular Rate:Normal     Neuro/Psych  Headaches  Anxiety Depression     negative psych ROS   GI/Hepatic negative GI ROS, Neg liver ROS,,,  Endo/Other  diabetes, Type 2  Class 3 obesity  Renal/GU negative Renal ROS  negative genitourinary   Musculoskeletal  (+) Arthritis ,    Abdominal   Peds  Hematology  (+) Blood dyscrasia, anemia   Anesthesia Other Findings   Reproductive/Obstetrics negative OB ROS                             Anesthesia Physical Anesthesia Plan  ASA: 4  Anesthesia Plan: General   Post-op Pain Management: Tylenol PO (pre-op)*   Induction: Intravenous  PONV Risk Score and Plan: 4 or greater and Ondansetron, Dexamethasone and Midazolam  Airway Management Planned: Oral ETT  Additional Equipment:   Intra-op Plan:   Post-operative Plan: Extubation in OR  Informed Consent: I have reviewed the patients History and Physical, chart, labs and discussed the procedure including the risks, benefits and alternatives for the proposed anesthesia with the patient or authorized representative who has indicated his/her understanding and acceptance.     Dental advisory given  Plan Discussed with: CRNA  Anesthesia Plan Comments: (PAT note written 04/14/2023 by  Shonna Chock, PA-C.  )       Anesthesia Quick Evaluation

## 2023-04-14 NOTE — Progress Notes (Signed)
Incoming call from pt's husband to clarify which medications she should take prior to surgery tomorrow. Pt also questioned his surgery time stating the office told him surgery would like be at 1500 and he was told today surgery he needed to be here at 0625 for an 0840 surgery. I explained to the pt's husband that she was an add on and that was not her actual surgery time. I gave him the number for the OR main desk and asked that he call at 0800 for a surgery time as it should be scheduled by then. Husband verbalized understanding to call the OR at 0800 for a time.

## 2023-04-14 NOTE — Progress Notes (Signed)
Anesthesia Chart Review: SAME DAY WORK-UP  Case: [id number] Date/Time: 04/15/23 0840   Procedure: irrigation and debridement and placement of integra to left lower extremity wound (Left)   Anesthesia type: Choice   Pre-op diagnosis: Wound of left lower extremity,   Location: [facility] TO [facility] SCHEDULED ROOM 01 - ADD ON / [facility]   Surgeons: [name], MD       DISCUSSION:  Patient is a 58 year old [name] scheduled for the above procedure. She is s/p debridement of [name] wound with placement of Myriad tissue replacement matrix on 02/06/23 and 02/24/23. She needs further debridement.    History includes former smoker (quit 02/25/97), HTN, nonischemic cardiomyopathy (diagnosed post-partum 2003), cardiac arrest (12/05/22, see below; EF 30-35%, normal coronaries, 12/09/22), DM2, OSA (intolerant of CPAP), HLD, fibromyalgia, morbid obesity.    [facility] admission 12/05/22 - 12/17/22 for out of hospital witnessed cardiac arrest. Husband called EMS and started CPR. Found to be in VT/VF and ACLS initiated. ROSC after 40 minutes, total of shocks x6, epinephrine x9, 300 mg amiodarone, 2 gm of magnesium. Intubated on arrival to ED. IV access obtained with use of [name] IO. 12/07/22 echo showed unchanged LVEF 30-35%, diffuse LV hypokinesis, mild LVH, normal RV systolic function, trivial MR. RHC/LHC on 12/09/22 showed normal coronaries, mildly elevated PA pressure with elevated EDP, non-ischemic cardiomyopathy. Cardiac MRI on 12/12/22 showed EF 31%, RV insertion site late gadolinium enhancement with non-specific scar pattern that can be seen in setting of elevated pulmonary pressures. She developed [name] wound at site of IO placement. Orthopedics and plastic surgery consulted. As above, local wound care recommended unless worsening or infection. She was discharged to [facility] (12/17/22 - 12/30/22) for rehab therapy on GDMT and a LifeVest. She ultimately required debridement of [name] with placement of Myriad tissue replacement  matrix on 02/06/23 and 02/24/23. IT is felt to eventually need ICD for secondary prevention but not until her [name] wound has improved.     She had EP evaluation with [name],[name], PA-C on 11/Samantha/24 for follow-up non-ischemic CM with HFrEF and witnessed cardiac arrest 12/05/22. CPR/ACLS x 40 minutes with shocks x6 and ROSC. ICD not placed due to extensive [name] wound. "The risks of a transvenous device outweigh the benefits at this juncture, with significant risk or infection given ongoing leg wound." Continued Life Vest planned for now. Follow-up with Dr. [name] in 3 months, scheduled for 04/22/23.   Evaluated at the [facility] by Tonye Becket, NP on 01/14/23. Volume status stable. Unable to titrate GDMT due to some low BPs and occasional lightheadedness. Contiue with Life vest. Repeat echo in 3-4 months.    His primary cardiologist is Dr. [name]. He last saw her on 01/22/23, but more recent visit with [name], MD on 05-02-2023 for feeling "bloated" with some swelling in her fingers and feet. He wrote, "Her exam is benign. Her lungs are clear. There is no JVD. She has minimal edema clinically. She is on furosemide 40 mg once a day. I am going to increase this to twice daily for 3 days and back to once a day and we will have her see an APP back in 4 weeks in follow-up."  Life vest is planned for now instead of ICD until [name] wound improved. Meds include Lipitor, Farxiga, Lasix, losartan, Toprol, spironolactone. Her next visit with Dr. [name] is currently scheduled for 05/29/23.   Last ID follow-up with Dr. Renold Don was on 05/02/2023.  She had passed an amoxicillin challenge per allergist Selena Batten,  Murrell Converse, DO on 03/12/23. PICC placed 04/02/23 and started on piperacillin/ tazobactam (Zosyn). 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  Last Fargixa 04/13/23.    VS:  Wt Readings from Last 3 Encounters:  04/08/23 117.5 kg  04/08/23 116.1 kg  03/24/23 115.2 kg   BP Readings from Last 3 Encounters:  04/08/23 127/82  04/08/23 09/81  04/07/23 (!) 99/54   Pulse Readings from Last 3 Encounters:  04/08/23 86  04/08/23 86  04/07/23 91     PROVIDERS: [name], MD is PCP  [name], MD is primary cardiologist Tonye Becket, NP is HF Provider Loman Brooklyn, MD is EP cardiologist Rutha Bouchard, MD is ID Marlowe Kays, PA-C/Aquino, [name], MD is neurologist. Evaluated on 03/14/23 for memory impairment.    LABS: Most recent labs in [facility] include: Lab Results  Component Value Date   WBC 8.9 02/28/2023   HGB 14.1 02/28/2023   HCT 44.5 02/28/2023   PLT 284 02/28/2023   GLUCOSE 173 (H) 02/28/2023   CHOL 194 08/13/2022   TRIG 208 (H) 12/06/2022   HDL 48.90 08/13/2022   LDLDIRECT 143.8 04/03/2011   LDLCALC 117 (H) 08/13/2022   ALT 30 (H) 02/28/2023   AST 26 02/28/2023   NA 140 02/28/2023   K 4.1 02/28/2023   CL 100 02/28/2023   CREATININE 1.01 02/28/2023   BUN 21 02/28/2023   CO2 24 02/28/2023   TSH 4.29 11/Samantha/2024   INR 1.2 02/25/2022   HGBA1C 6.6 (H) 12/07/2022   MICROALBUR 1.3 03/06/2022     IMAGES: Xray C-spine 12/21/22: IMPRESSION: 1. Multilevel cervical spondylosis and facet hypertrophy. 2. No acute bony abnormalities.   1V CXR 12/10/22: FINDINGS: Patient is rotated. Endotracheal and enteric tubes have been removed. Left subclavian central line remains in place. Cardiac silhouette remains mildly enlarged. Improving aeration of the lung bases with mild residual atelectasis. No pleural effusion or pneumothorax. IMPRESSION: Improving aeration of the lung bases with mild residual atelectasis.   CTA Chest 12/05/22: IMPRESSION: 1. No evidence of pulmonary embolism. 2. Bilateral upper lobe atelectasis. Pneumonia is considered less likely though difficult to exclude.   CT Head  12/05/22: IMPRESSION: 1. Questionable area of low-density in the right frontal lobe. Gray-white differentiation is preserved. This is not confirmed on reformats. This may be artifactual, recommend MRI for further assessment. 2. No acute intracranial hemorrhage.     EKG: 01/14/23: Normal sinus rhythm Low voltage QRS Confirmed by Steffanie Dunn [phone]) on 01/14/2023 9:24:06 PM     CV: Cardiac MRI 12/12/22: IMPRESSION: 1. Normal LV size, no hypertrophy, and moderate systolic dysfunction (EF 31%) 2.  Normal RV size and systolic function (EF 50%) 3. RV insertion site late gadolinium enhancement, which is a nonspecific scar pattern often seen in setting of elevated pulmonary pressures     Right & Left Heart Catheterization 12/09/22: Hemodynamic data: LV: 119/7, EDP 17 mmHg.  Ao 110/78, mean 92 mmHg.  No pressure gradient across the aortic valve. RA: 15/13, mean 8 mmHg. RV 47/6, EDP 17 mmHg. PA 46/21, mean 31 mmHg.  PA saturation 66%. PW 18/19, mean 19 mmHg.  AO saturation 92%. CO 6.69, CI 2.83, preserved and normal.  QP/QS 1.00.   Angiographic data: RCA: Dominant, has anterior origin, small to normal. LM: Has superior takeoff, a 5 [place] 3.0 guide catheter  was utilized to engage.  Normal left main. LAD: Large-caliber vessel giving origin to 2 large diagonals, smooth and normal. RI: Moderate caliber vessel, smooth and normal. LCx: Large vessel giving origin to large OM1 which has secondary branches and continues in the AV groove is a moderate-sized vessel and gives origin to OM 2 from the distal end.  Smooth and normal.   Impression and recommendations: Findings consistent with nonischemic cardiomyopathy.  Mildly elevated PA pressure with elevated EDP.       Echo 12/07/22: IMPRESSIONS   1. Left ventricular ejection fraction, by estimation, is 30 to 35%. The  left ventricle has moderately decreased function. The left ventricle  demonstrates global hypokinesis. There is  mild left ventricular  hypertrophy. Indeterminate diastolic filling due   to E-A fusion.   2. Right ventricular systolic function is normal. The right ventricular  size is normal.   3. The mitral valve is normal in structure. Trivial mitral valve  regurgitation.   4. The aortic valve is tricuspid. Aortic valve regurgitation is not  visualized.   5. The inferior vena cava IVC not well visualized.  - Comparison(s): No significant change from prior study.  - Comparison LVEF: 30-35% 07/10/21; 50-55% 11/4/Samantha; 30-35% 01/16/18; 35-40% 08/22/17; 30-35% 10/26/15, 06/08/13; 50% 11/06/11; 45-50% 06/27/11; 60% 01/13/07      Past Medical History:  Diagnosis Date   ADD (attention deficit disorder)    Anxiety    Arthritis    both knees right worse than left   Carpal tunnel syndrome of right wrist    Chronic back pain    Essential hypertension, benign    Gallstones    History of smoking 06/22/2016   Hyperlipidemia associated with type 2 diabetes mellitus (HCC), on Zocor 04/04/2011   Hypertension associated with diabetes (HCC) 06/03/2008   MDD (major depressive disorder)    Migraines    Morbid obesity (HCC)    Morbid obesity with BMI of 50.0-59.9, adult (HCC) 07/12/2006   NICM (nonischemic cardiomyopathy) (HCC) 07/12/2006   01/16/18 ECHO:    - Procedure narrative: Transthoracic echocardiography. Image   quality was suboptimal. The study was technically difficult.   Intravenous contrast (Definity) was administered. - Left ventricle: The cavity size was moderately dilated. Wall   thickness was increased in a pattern of mild LVH. Systolic   function was moderately to severely reduced. The estimated   ejection fraction w   Normal coronary arteries 04/17/2016   OSA on CPAP 10/28/2014   does not use CPAP- pt cannot tolerate CPAP   Spinal stenosis    back pain   Spondylosis without myelopathy or radiculopathy, lumbar region 12/04/2017   Type 2 diabetes mellitus with hyperglycemia (HCC)    Walker as  ambulation aid     Past Surgical History:  Procedure Laterality Date   CHOLECYSTECTOMY     DILATATION & CURETTAGE/HYSTEROSCOPY WITH MYOSURE N/A 10/31/2017   Procedure: DILATATION & CURETTAGE/HYSTEROSCOPY WITH MYOSURE;  Surgeon: Romualdo Bolk, MD;  Location: [facility] ORS;  Service: Gynecology;  Laterality: N/A;   HYSTEROSCOPY WITH D & C N/A 05/07/2022   Procedure: DILATATION AND CURETTAGE /HYSTEROSCOPY;  Surgeon: [name], MD;  Location: [facility] OR;  Service: Gynecology;  Laterality: N/A;   INCISION AND DRAINAGE OF WOUND Left 02/06/2023   Procedure: debridement of left leg wound, application of myriad;  Surgeon: [name], MD;  Location: [facility] OR;  Service: Plastics;  Laterality: Left;   INCISION AND DRAINAGE OF WOUND Left 02/24/2023   Procedure: application of tissue replacement  matrix of left patellar tendon;  Surgeon: [name], MD;  Location: [facility] OR;  Service: Plastics;  Laterality: Left;   IRRIGATION AND DEBRIDEMENT KNEE  02/24/2023   Procedure: IRRIGATION AND DEBRIDEMENT KNEE;  Surgeon: [name], MD;  Location: [facility] OR;  Service: Plastics;;   life vest     RIGHT/LEFT HEART CATH AND CORONARY ANGIOGRAPHY N/A 04/11/2016   Procedure: Right/Left Heart Cath and Coronary Angiography;  Surgeon: Kathleene Hazel, MD;  Location: [facility] INVASIVE CV LAB;  Service: Cardiovascular;  Laterality: N/A;   RIGHT/LEFT HEART CATH AND CORONARY ANGIOGRAPHY N/A 12/09/2022   Procedure: RIGHT/LEFT HEART CATH AND CORONARY ANGIOGRAPHY;  Surgeon: Yates Decamp, MD;  Location: [facility] INVASIVE CV LAB;  Service: Cardiovascular;  Laterality: N/A;   sonogram for blood clots     no blockages    MEDICATIONS: No current facility-administered medications for this encounter.    acetaminophen (TYLENOL) 325 MG tablet   atorvastatin (LIPITOR) 80 MG tablet   dapagliflozin propanediol (FARXIGA) 10 MG TABS tablet   furosemide (LASIX) 40 MG tablet   hydrOXYzine (ATARAX) 25 MG tablet   losartan (COZAAR)  25 MG tablet   magnesium oxide (MAG-OX) 400 MG tablet   metFORMIN (GLUCOPHAGE-XR) 500 MG 24 hr tablet   metoprolol succinate (TOPROL-XL) 50 MG 24 hr tablet   Oxycodone HCl 10 MG TABS   piperacillin-tazobactam (ZOSYN) 4.5 (4-0.5) g injection   polyethylene glycol powder (GLYCOLAX/MIRALAX) 17 GM/SCOOP powder   potassium chloride SA (KLOR-CON M) Samantha MEQ tablet   senna-docusate (SENOKOT-S) 8.6-50 MG tablet   spironolactone (ALDACTONE) 25 MG tablet   Accu-Chek Softclix Lancets lancets   AMBULATORY NON FORMULARY MEDICATION   Blood Glucose Monitoring Suppl DEVI   Boric Acid Vaginal 600 MG SUPP   collagenase (SANTYL) 250 UNIT/GM ointment   fluticasone (FLONASE) 50 MCG/ACT nasal spray   gentian violet (GNP GENTIAN VIOLET) 1 % topical solution   Glucose Blood (BLOOD GLUCOSE TEST STRIPS) STRP   ketoconazole (NIZORAL) 2 % cream   LORazepam (ATIVAN) 0.5 MG tablet   NON FORMULARY   ondansetron (ZOFRAN-ODT) 4 MG disintegrating tablet   vortioxetine HBr (TRINTELLIX) 5 MG TABS tablet    Shonna Chock, PA-C Surgical Short Stay/Anesthesiology [facility] Phone [phone] [facility] Phone [phone] 04/14/2023 1:50 PM

## 2023-04-15 ENCOUNTER — Ambulatory Visit (HOSPITAL_COMMUNITY)
Admission: RE | Admit: 2023-04-15 | Discharge: 2023-04-15 | Disposition: A | Discharge status: Home / Self Care | Payer: Medicare Other | Attending: Plastic Surgery | Admitting: Plastic Surgery

## 2023-04-15 ENCOUNTER — Encounter (HOSPITAL_COMMUNITY): Payer: Self-pay | Admitting: Plastic Surgery

## 2023-04-15 ENCOUNTER — Ambulatory Visit (HOSPITAL_COMMUNITY): Payer: Self-pay | Admitting: Physician Assistant

## 2023-04-15 ENCOUNTER — Other Ambulatory Visit: Payer: Self-pay

## 2023-04-15 ENCOUNTER — Ambulatory Visit (HOSPITAL_BASED_OUTPATIENT_CLINIC_OR_DEPARTMENT_OTHER): Payer: Medicare Other | Admitting: Physician Assistant

## 2023-04-15 ENCOUNTER — Ambulatory Visit: Payer: Medicare Other | Admitting: Internal Medicine

## 2023-04-15 ENCOUNTER — Encounter (HOSPITAL_COMMUNITY)
Admission: RE | Disposition: A | Discharge status: Home / Self Care | Payer: Self-pay | Source: Home / Self Care | Attending: Plastic Surgery

## 2023-04-15 DIAGNOSIS — Z8674 Personal history of sudden cardiac arrest: Secondary | ICD-10-CM | POA: Diagnosis not present

## 2023-04-15 DIAGNOSIS — Z6841 Body Mass Index (BMI) 40.0 and over, adult: Secondary | ICD-10-CM | POA: Diagnosis not present

## 2023-04-15 DIAGNOSIS — Z87891 Personal history of nicotine dependence: Secondary | ICD-10-CM | POA: Insufficient documentation

## 2023-04-15 DIAGNOSIS — S81802D Unspecified open wound, left lower leg, subsequent encounter: Secondary | ICD-10-CM | POA: Diagnosis not present

## 2023-04-15 DIAGNOSIS — E119 Type 2 diabetes mellitus without complications: Secondary | ICD-10-CM | POA: Diagnosis not present

## 2023-04-15 DIAGNOSIS — G8929 Other chronic pain: Secondary | ICD-10-CM | POA: Diagnosis not present

## 2023-04-15 DIAGNOSIS — I509 Heart failure, unspecified: Secondary | ICD-10-CM | POA: Insufficient documentation

## 2023-04-15 DIAGNOSIS — G4733 Obstructive sleep apnea (adult) (pediatric): Secondary | ICD-10-CM | POA: Insufficient documentation

## 2023-04-15 DIAGNOSIS — I110 Hypertensive heart disease with heart failure: Secondary | ICD-10-CM

## 2023-04-15 DIAGNOSIS — I5042 Chronic combined systolic (congestive) and diastolic (congestive) heart failure: Secondary | ICD-10-CM | POA: Diagnosis not present

## 2023-04-15 DIAGNOSIS — S81802A Unspecified open wound, left lower leg, initial encounter: Secondary | ICD-10-CM | POA: Diagnosis present

## 2023-04-15 DIAGNOSIS — X58XXXD Exposure to other specified factors, subsequent encounter: Secondary | ICD-10-CM | POA: Diagnosis not present

## 2023-04-15 HISTORY — DX: Major depressive disorder, single episode, unspecified: F32.9

## 2023-04-15 HISTORY — DX: Dependence on other enabling machines and devices: Z99.89

## 2023-04-15 HISTORY — PX: INCISION AND DRAINAGE OF WOUND: SHX1803

## 2023-04-15 HISTORY — DX: Other chronic pain: G89.29

## 2023-04-15 HISTORY — DX: Other amnesia: R41.3

## 2023-04-15 HISTORY — DX: Cardiac arrhythmia, unspecified: I49.9

## 2023-04-15 LAB — GLUCOSE, CAPILLARY
Glucose-Capillary: 142 mg/dL — ABNORMAL HIGH (ref 70–99)
Glucose-Capillary: 124 mg/dL — ABNORMAL HIGH (ref 70–99)
Glucose-Capillary: 119 mg/dL — ABNORMAL HIGH (ref 70–99)

## 2023-04-15 LAB — POCT I-STAT, CHEM 8
BUN: 18 mg/dL (ref 6–20)
Calcium, Ion: 1.18 mmol/L (ref 1.15–1.40)
Chloride: 101 mmol/L (ref 98–111)
Creatinine, Ser: 1.1 mg/dL — ABNORMAL HIGH (ref 0.44–1.00)
Glucose, Bld: 135 mg/dL — ABNORMAL HIGH (ref 70–99)
HCT: 43 % (ref 36.0–46.0)
Hemoglobin: 14.6 g/dL (ref 12.0–15.0)
Potassium: 3.4 mmol/L — ABNORMAL LOW (ref 3.5–5.1)
Sodium: 141 mmol/L (ref 135–145)
TCO2: 26 mmol/L (ref 22–32)

## 2023-04-15 SURGERY — IRRIGATION AND DEBRIDEMENT WOUND
Anesthesia: General | Laterality: Left

## 2023-04-15 MED ORDER — LIDOCAINE 2% (20 MG/ML) 5 ML SYRINGE
INTRAMUSCULAR | Status: DC | PRN
  Administered 2023-04-15: 80 mg via INTRAVENOUS

## 2023-04-15 MED ORDER — HYDROMORPHONE HCL 1 MG/ML IJ SOLN: 0.2500 mg | INTRAMUSCULAR | Status: DC | PRN

## 2023-04-15 MED ORDER — SUGAMMADEX SODIUM 200 MG/2ML IV SOLN
INTRAVENOUS | Status: DC | PRN
  Administered 2023-04-15: 400 mg via INTRAVENOUS

## 2023-04-15 MED ORDER — SUGAMMADEX SODIUM 200 MG/2ML IV SOLN
INTRAVENOUS | Status: AC
  Filled 2023-04-15: qty 2

## 2023-04-15 MED ORDER — CHLORHEXIDINE GLUCONATE CLOTH 2 % EX PADS: 6.0000 | MEDICATED_PAD | Freq: Once | CUTANEOUS | Status: DC

## 2023-04-15 MED ORDER — VASHE WOUND IRRIGATION OPTIME
TOPICAL | Status: DC | PRN
Start: 2023-04-15 — End: 2023-04-15
  Administered 2023-04-15: 34 [oz_av]

## 2023-04-15 MED ORDER — LIDOCAINE-EPINEPHRINE 1 %-1:100000 IJ SOLN
INTRAMUSCULAR | Status: AC
Start: 2023-04-15 — End: ?
  Filled 2023-04-15: qty 1

## 2023-04-15 MED ORDER — CHLORHEXIDINE GLUCONATE 0.12 % MT SOLN
OROMUCOSAL | Status: AC
Start: 2023-04-15 — End: 2023-04-15
  Administered 2023-04-15: 15 mL via OROMUCOSAL
  Filled 2023-04-15: qty 15

## 2023-04-15 MED ORDER — MIDAZOLAM HCL 2 MG/2ML IJ SOLN
INTRAMUSCULAR | Status: AC
  Filled 2023-04-15: qty 2

## 2023-04-15 MED ORDER — ACETAMINOPHEN 500 MG PO TABS
1000.0000 mg | ORAL_TABLET | Freq: Once | ORAL | Status: AC
  Administered 2023-04-15: 1000 mg via ORAL

## 2023-04-15 MED ORDER — ROCURONIUM BROMIDE 10 MG/ML (PF) SYRINGE
PREFILLED_SYRINGE | INTRAVENOUS | Status: AC
  Filled 2023-04-15: qty 10

## 2023-04-15 MED ORDER — ETOMIDATE 2 MG/ML IV SOLN
INTRAVENOUS | Status: AC
  Filled 2023-04-15: qty 10

## 2023-04-15 MED ORDER — ROCURONIUM BROMIDE 10 MG/ML (PF) SYRINGE
PREFILLED_SYRINGE | INTRAVENOUS | Status: DC | PRN
  Administered 2023-04-15: 40 mg via INTRAVENOUS

## 2023-04-15 MED ORDER — ORAL CARE MOUTH RINSE: 15.0000 mL | Freq: Once | OROMUCOSAL | Status: AC

## 2023-04-15 MED ORDER — FENTANYL CITRATE (PF) 250 MCG/5ML IJ SOLN
INTRAMUSCULAR | Status: DC | PRN
Start: 2023-04-15 — End: 2023-04-15
  Administered 2023-04-15 (×3): 50 ug via INTRAVENOUS

## 2023-04-15 MED ORDER — FENTANYL CITRATE (PF) 250 MCG/5ML IJ SOLN
INTRAMUSCULAR | Status: AC
  Filled 2023-04-15: qty 5

## 2023-04-15 MED ORDER — 0.9 % SODIUM CHLORIDE (POUR BTL) OPTIME
TOPICAL | Status: DC | PRN
  Administered 2023-04-15: 1000 mL

## 2023-04-15 MED ORDER — CEFAZOLIN SODIUM-DEXTROSE 2-4 GM/100ML-% IV SOLN
2.0000 g | INTRAVENOUS | Status: AC
  Administered 2023-04-15: 2 g via INTRAVENOUS
  Filled 2023-04-15: qty 100

## 2023-04-15 MED ORDER — ACETAMINOPHEN 500 MG PO TABS
ORAL_TABLET | ORAL | Status: AC
  Filled 2023-04-15: qty 2

## 2023-04-15 MED ORDER — MIDAZOLAM HCL 2 MG/2ML IJ SOLN
INTRAMUSCULAR | Status: DC | PRN
Start: 2023-04-15 — End: 2023-04-15
  Administered 2023-04-15: 2 mg via INTRAVENOUS

## 2023-04-15 MED ORDER — ONDANSETRON HCL 4 MG/2ML IJ SOLN
INTRAMUSCULAR | Status: AC
  Filled 2023-04-15: qty 2

## 2023-04-15 MED ORDER — CHLORHEXIDINE GLUCONATE CLOTH 2 % EX PADS
6.0000 | MEDICATED_PAD | Freq: Once | CUTANEOUS | Status: DC
Start: 2023-04-15 — End: 2023-04-15

## 2023-04-15 MED ORDER — ETOMIDATE 2 MG/ML IV SOLN
INTRAVENOUS | Status: DC | PRN
  Administered 2023-04-15: 14 mg via INTRAVENOUS

## 2023-04-15 MED ORDER — INSULIN ASPART 100 UNIT/ML IJ SOLN: 0.0000 [IU] | INTRAMUSCULAR | Status: DC | PRN

## 2023-04-15 MED ORDER — CHLORHEXIDINE GLUCONATE 0.12 % MT SOLN: 15.0000 mL | Freq: Once | OROMUCOSAL | Status: AC

## 2023-04-15 MED ORDER — ONDANSETRON HCL 4 MG/2ML IJ SOLN
INTRAMUSCULAR | Status: DC | PRN
  Administered 2023-04-15: 4 mg via INTRAVENOUS

## 2023-04-15 MED ORDER — LACTATED RINGERS IV SOLN: INTRAVENOUS | Status: DC

## 2023-04-15 SURGICAL SUPPLY — 52 items
APPLICATOR COTTON TIP 6 STRL (MISCELLANEOUS) IMPLANT
APPLICATOR COTTON TIP 6IN STRL (MISCELLANEOUS) IMPLANT
BAG COUNTER SPONGE SURGICOUNT (BAG) ×2 IMPLANT
BAG DECANTER FOR FLEXI CONT (MISCELLANEOUS) IMPLANT
BENZOIN TINCTURE PRP APPL 2/3 (GAUZE/BANDAGES/DRESSINGS) ×2 IMPLANT
BNDG ELASTIC 6INX 5YD STR LF (GAUZE/BANDAGES/DRESSINGS) IMPLANT
BNDG GAUZE DERMACEA FLUFF 4 (GAUZE/BANDAGES/DRESSINGS) IMPLANT
CANISTER SUCT 3000ML PPV (MISCELLANEOUS) ×2 IMPLANT
CNTNR URN SCR LID CUP LEK RST (MISCELLANEOUS) IMPLANT
COVER SURGICAL LIGHT HANDLE (MISCELLANEOUS) ×2 IMPLANT
DRAIN CHANNEL 19F RND (DRAIN) IMPLANT
DRAIN JP 10F RND SILICONE (MISCELLANEOUS) IMPLANT
DRAPE HALF SHEET 40X57 (DRAPES) IMPLANT
DRAPE IMP U-DRAPE 54X76 (DRAPES) ×2 IMPLANT
DRAPE INCISE IOBAN 66X45 STRL (DRAPES) IMPLANT
DRAPE LAPAROSCOPIC ABDOMINAL (DRAPES) IMPLANT
DRAPE LAPAROTOMY 100X72 PEDS (DRAPES) ×2 IMPLANT
DRSG ADAPTIC 3X8 NADH LF (GAUZE/BANDAGES/DRESSINGS) IMPLANT
DRSG CALCIUM ALGINATE 4X4 (GAUZE/BANDAGES/DRESSINGS) IMPLANT
DRSG VAC GRANUFOAM LG (GAUZE/BANDAGES/DRESSINGS) IMPLANT
DRSG VAC GRANUFOAM MED (GAUZE/BANDAGES/DRESSINGS) IMPLANT
DRSG VAC GRANUFOAM SM (GAUZE/BANDAGES/DRESSINGS) IMPLANT
ELECT CAUTERY BLADE 6.4 (BLADE) IMPLANT
ELECT REM PT RETURN 9FT ADLT (ELECTROSURGICAL) ×1 IMPLANT
ELECTRODE REM PT RTRN 9FT ADLT (ELECTROSURGICAL) ×2 IMPLANT
GAUZE PAD ABD 8X10 STRL (GAUZE/BANDAGES/DRESSINGS) IMPLANT
GAUZE SPONGE 4X4 12PLY STRL (GAUZE/BANDAGES/DRESSINGS) IMPLANT
GLOVE BIO SURGEON STRL SZ8 (GLOVE) ×2 IMPLANT
GLOVE BIOGEL PI IND STRL 8 (GLOVE) ×2 IMPLANT
GOWN STRL REUS W/ TWL LRG LVL3 (GOWN DISPOSABLE) ×6 IMPLANT
GOWN STRL REUS W/TWL XL LVL3 (GOWN DISPOSABLE) ×2 IMPLANT
KIT BASIN OR (CUSTOM PROCEDURE TRAY) ×2 IMPLANT
KIT TURNOVER KIT B (KITS) ×2 IMPLANT
NDL HYPO 25GX1X1/2 BEV (NEEDLE) ×2 IMPLANT
NEEDLE HYPO 25GX1X1/2 BEV (NEEDLE) ×1 IMPLANT
NS IRRIG 1000ML POUR BTL (IV SOLUTION) ×2 IMPLANT
PACK GENERAL/GYN (CUSTOM PROCEDURE TRAY) ×2 IMPLANT
PACK UNIVERSAL I (CUSTOM PROCEDURE TRAY) ×2 IMPLANT
PAD ARMBOARD 7.5X6 YLW CONV (MISCELLANEOUS) ×4 IMPLANT
STAPLER VISISTAT 35W (STAPLE) ×2 IMPLANT
SURGILUBE 2OZ TUBE FLIPTOP (MISCELLANEOUS) IMPLANT
SUT MNCRL AB 3-0 PS2 27 (SUTURE) IMPLANT
SUT MNCRL AB 4-0 PS2 18 (SUTURE) IMPLANT
SUT MON AB 2-0 CT1 36 (SUTURE) IMPLANT
SUT MON AB 5-0 PS2 18 (SUTURE) IMPLANT
SUT VIC AB 5-0 PS2 18 (SUTURE) IMPLANT
SUT VICRYL 3 0 (SUTURE) IMPLANT
SWAB COLLECTION DEVICE MRSA (MISCELLANEOUS) IMPLANT
SWAB CULTURE ESWAB REG 1ML (MISCELLANEOUS) IMPLANT
SYR CONTROL 10ML LL (SYRINGE) ×2 IMPLANT
TOWEL GREEN STERILE (TOWEL DISPOSABLE) ×2 IMPLANT
UNDERPAD 30X36 HEAVY ABSORB (UNDERPADS AND DIAPERS) ×2 IMPLANT

## 2023-04-15 NOTE — Transfer of Care (Signed)
Immediate Anesthesia Transfer of Care Note  Patient: [name]  Procedure(s) Performed: irrigation and debridement to left lower extremity wound (Left)  Patient Location: PACU  Anesthesia Type:General  Level of Consciousness: awake, alert , and oriented  Airway & Oxygen Therapy: Patient connected to face mask oxygen  Post-op Assessment: Report given to RN and Post -op Vital signs reviewed and stable  Post vital signs: Reviewed and stable  Last Vitals:  Vitals Value Taken Time  BP 131/97 04/15/23 1526  Temp    Pulse 87 04/15/23 1529  Resp 15 04/15/23 1529  SpO2 96 % 04/15/23 1529  Vitals shown include unfiled device data.  Last Pain:  Vitals:   04/15/23 1219  TempSrc:   PainSc: 8          Complications: There were no known notable events for this encounter.

## 2023-04-15 NOTE — Anesthesia Postprocedure Evaluation (Signed)
Anesthesia Post Note  Patient: [name]  Procedure(s) Performed: irrigation and debridement to left lower extremity wound (Left)     Patient location during evaluation: PACU Anesthesia Type: General Level of consciousness: awake and alert Pain management: pain level controlled Vital Signs Assessment: post-procedure vital signs reviewed and stable Respiratory status: spontaneous breathing, nonlabored ventilation and respiratory function stable Cardiovascular status: blood pressure returned to baseline and stable Postop Assessment: no apparent nausea or vomiting Anesthetic complications: no  There were no known notable events for this encounter.  Last Vitals:  Vitals:   04/15/23 1545 04/15/23 1600  BP: 111/83 (!) 149/95  Pulse: 79 76  Resp: 15 15  Temp:  36.5 C  SpO2: 98% 92%    Last Pain:  Vitals:   04/15/23 1545  TempSrc:   PainSc: 3                  Maebry Obrien,W. EDMOND

## 2023-04-15 NOTE — Discharge Instructions (Addendum)
Diet: High protein, low sugar.  Wound care: Your tendon and surrounding wound was debrided. Cultures were obtained and sent. Recommending continued daily dressing changes with Adaptic, [place] jelly, ABD pad, and Kerlix.  Recommend continued Vashe-soaked gauze to help keep the wound clean.  Activity: As tolerated.

## 2023-04-15 NOTE — Anesthesia Procedure Notes (Addendum)
Procedure Name: Intubation Date/Time: 04/15/2023 2:39 PM  Performed by: Berniece Pap, CRNAPre-anesthesia Checklist: Patient identified, Emergency Drugs available, Suction available and Patient being monitored Patient Re-evaluated:Patient Re-evaluated prior to induction Oxygen Delivery Method: Circle system utilized Preoxygenation: Pre-oxygenation with 100% oxygen Induction Type: IV induction Ventilation: Mask ventilation without difficulty and Oral airway inserted - appropriate to patient size Laryngoscope Size: Mac and 4 Grade View: Grade I Tube type: Oral Tube size: 7.0 mm Number of attempts: 1 Airway Equipment and Method: Stylet and Oral airway Placement Confirmation: ETT inserted through vocal cords under direct vision, positive ETCO2 and breath sounds checked- equal and bilateral Secured at: 22 cm Tube secured with: Tape Dental Injury: Teeth and Oropharynx as per pre-operative assessment

## 2023-04-15 NOTE — Op Note (Signed)
DATE OF OPERATION: 04/15/2023  LOCATION: [facility] Main operating Room  PREOPERATIVE DIAGNOSIS: Wound, anterior left leg  POSTOPERATIVE DIAGNOSIS: Same  PROCEDURE: Examination under anesthesia with debridement of patellar tendon  SURGEON: Loren Racer MD  ASSISTANT: Evelena Leyden  EBL: 10 cc  CONDITION: Stable  COMPLICATIONS: None  INDICATION: The patient, [name], is a 58 y.o. female born on [birth year], is here for ongoing treatment of an anterior left leg wound sustained during treatment of cardiac arrest.   PROCEDURE DETAILS:  The patient was seen prior to surgery and marked.  The IV antibiotics were given. The patient was taken to the operating room and given a general anesthetic. A standard time out was performed and all information was confirmed by those in the room. SCDs were placed.   The leg was prepped and draped in usual sterile manner.  On inspection she has had outstanding wound healing with decrease in wound size of approximately 50%.  She does still have exposed patellar tendon.  The dressings had a green hue to them consistent with ongoing Pseudomonas infection.  For this reason I elected not to place Integra over the tendon today.  I did perform debridement of the anterior surface of the tendon.  The debridement of the patellar tendon was performed sharply with a scalpel and was 4.5 x 3 x 0.1 cm.  The necrotic portion of the tendon was sent for culture.  A second random culture was also taken of the wound.  The entire wound was irrigated with saline and then Vashe.  Dressings were applied and the patient was awakened from anesthesia without incident.  She was placed in Ace wrap and transferred to the recovery room in good condition.  All instrument needle and sponge counts reported as correct no complications were appreciated during the procedure. The patient was allowed to wake up and taken to recovery room in stable condition at the end of the case. The family was notified at  the end of the case.   The advanced practice practitioner (APP) assisted throughout the case.  The APP was essential in retraction and counter traction when needed to make the case progress smoothly.  This retraction and assistance made it possible to see the tissue plans for the procedure.  The assistance was needed for blood control, tissue re-approximation and assisted with closure of the incision site.

## 2023-04-15 NOTE — Interval H&P Note (Signed)
History and Physical Interval Note: No change in exam or indication for surgery All questions answered Site marked with her agreement Will proceed with left leg wound management at her request.  04/15/2023 2:18 PM  [name]  has presented today for surgery, with the diagnosis of Wound of left lower extremity.  The various methods of treatment have been discussed with the patient and family. After consideration of risks, benefits and other options for treatment, the patient has consented to  Procedure(s): irrigation and debridement and placement of integra to left lower extremity wound (Left) as a surgical intervention.  The patient's history has been reviewed, patient examined, no change in status, stable for surgery.  I have reviewed the patient's chart and labs.  Questions were answered to the patient's satisfaction.     [name]

## 2023-04-16 ENCOUNTER — Encounter (HOSPITAL_COMMUNITY): Payer: Self-pay | Admitting: Plastic Surgery

## 2023-04-17 ENCOUNTER — Ambulatory Visit: Payer: Medicare Other | Admitting: Internal Medicine

## 2023-04-20 LAB — AEROBIC/ANAEROBIC CULTURE W GRAM STAIN (SURGICAL/DEEP WOUND): Culture: NO GROWTH

## 2023-04-21 ENCOUNTER — Telehealth: Payer: Self-pay

## 2023-04-21 ENCOUNTER — Ambulatory Visit (INDEPENDENT_AMBULATORY_CARE_PROVIDER_SITE_OTHER): Payer: Medicare Other | Admitting: Plastic Surgery

## 2023-04-21 VITALS — BP 124/84 | HR 94 | Wt 253.6 lb

## 2023-04-21 DIAGNOSIS — T8149XA Infection following a procedure, other surgical site, initial encounter: Secondary | ICD-10-CM | POA: Diagnosis not present

## 2023-04-21 DIAGNOSIS — I5042 Chronic combined systolic (congestive) and diastolic (congestive) heart failure: Secondary | ICD-10-CM | POA: Diagnosis not present

## 2023-04-21 DIAGNOSIS — T148XXA Other injury of unspecified body region, initial encounter: Secondary | ICD-10-CM | POA: Diagnosis not present

## 2023-04-21 DIAGNOSIS — B9689 Other specified bacterial agents as the cause of diseases classified elsewhere: Secondary | ICD-10-CM | POA: Diagnosis not present

## 2023-04-21 DIAGNOSIS — E1159 Type 2 diabetes mellitus with other circulatory complications: Secondary | ICD-10-CM | POA: Diagnosis not present

## 2023-04-21 DIAGNOSIS — F333 Major depressive disorder, recurrent, severe with psychotic symptoms: Secondary | ICD-10-CM | POA: Diagnosis not present

## 2023-04-21 DIAGNOSIS — I152 Hypertension secondary to endocrine disorders: Secondary | ICD-10-CM | POA: Diagnosis not present

## 2023-04-21 DIAGNOSIS — L089 Local infection of the skin and subcutaneous tissue, unspecified: Secondary | ICD-10-CM | POA: Diagnosis not present

## 2023-04-21 DIAGNOSIS — S81802D Unspecified open wound, left lower leg, subsequent encounter: Secondary | ICD-10-CM

## 2023-04-21 NOTE — Progress Notes (Signed)
 Ms. Felker returns today for evaluation.  She went to the operating room last week for possible Integra placement over the patellar tendon.  On evaluation of her wound there is still a green hue to the drainage on the dressings over the patellar tendon.  I elected not to place Integra and instead took biopsies.  The remainder of the wound has now been cleared of Pseudomonas but there is still Pseudomonas within the patellar tendon.  The remainder of the wound is healing very nicely and very quickly.  She is reepithelialized and all the edges of the wound and the wound is contracting as expected.  Except for the small area over the patellar tendon there is good granulation tissue everywhere.  I have discussed the ongoing wound care with Ameriah and her husband.  We will increase the frequency of dressing changes to twice a day.  1 of those dressing changes can be done by the patient or her husband.  I have also asked them to start washing the wound with simple soap and water daily.  Will continue to monitor her wound and have her repeat return in 1 to 2 weeks.  I have also discussed her care with Dr. Renold Don.  Will continue her antibiotics through March.

## 2023-04-21 NOTE — Telephone Encounter (Signed)
 Per Dr. Renold Don, extend OPAT orders to 05/26/23. Orders sent to Jeri Modena, RN with [facility].  Sandie Ano, RN

## 2023-04-21 NOTE — Telephone Encounter (Signed)
 Last office visit note sent to [facility] care regarding dressing changes. Dr. Ladona Ridgel increased the frequency of once a day to twice a day. Receipt of fax confirmation.

## 2023-04-22 ENCOUNTER — Ambulatory Visit (HOSPITAL_COMMUNITY): Payer: Medicare Other | Admitting: Mental Health

## 2023-04-22 ENCOUNTER — Ambulatory Visit: Payer: Medicare Other | Admitting: Pulmonary Disease

## 2023-04-24 DIAGNOSIS — I5042 Chronic combined systolic (congestive) and diastolic (congestive) heart failure: Secondary | ICD-10-CM | POA: Diagnosis not present

## 2023-04-24 DIAGNOSIS — F333 Major depressive disorder, recurrent, severe with psychotic symptoms: Secondary | ICD-10-CM | POA: Diagnosis not present

## 2023-04-24 DIAGNOSIS — I152 Hypertension secondary to endocrine disorders: Secondary | ICD-10-CM | POA: Diagnosis not present

## 2023-04-24 DIAGNOSIS — T8149XA Infection following a procedure, other surgical site, initial encounter: Secondary | ICD-10-CM | POA: Diagnosis not present

## 2023-04-24 DIAGNOSIS — B9689 Other specified bacterial agents as the cause of diseases classified elsewhere: Secondary | ICD-10-CM | POA: Diagnosis not present

## 2023-04-24 DIAGNOSIS — E1159 Type 2 diabetes mellitus with other circulatory complications: Secondary | ICD-10-CM | POA: Diagnosis not present

## 2023-04-26 ENCOUNTER — Other Ambulatory Visit: Payer: Self-pay | Admitting: Internal Medicine

## 2023-04-26 DIAGNOSIS — R0981 Nasal congestion: Secondary | ICD-10-CM

## 2023-04-26 DIAGNOSIS — E785 Hyperlipidemia, unspecified: Secondary | ICD-10-CM

## 2023-04-28 DIAGNOSIS — T148XXA Other injury of unspecified body region, initial encounter: Secondary | ICD-10-CM | POA: Diagnosis not present

## 2023-04-28 DIAGNOSIS — T8149XA Infection following a procedure, other surgical site, initial encounter: Secondary | ICD-10-CM | POA: Diagnosis not present

## 2023-04-28 DIAGNOSIS — I5042 Chronic combined systolic (congestive) and diastolic (congestive) heart failure: Secondary | ICD-10-CM | POA: Diagnosis not present

## 2023-04-28 DIAGNOSIS — I152 Hypertension secondary to endocrine disorders: Secondary | ICD-10-CM | POA: Diagnosis not present

## 2023-04-28 DIAGNOSIS — L089 Local infection of the skin and subcutaneous tissue, unspecified: Secondary | ICD-10-CM | POA: Diagnosis not present

## 2023-04-28 DIAGNOSIS — B9689 Other specified bacterial agents as the cause of diseases classified elsewhere: Secondary | ICD-10-CM | POA: Diagnosis not present

## 2023-04-28 DIAGNOSIS — E1159 Type 2 diabetes mellitus with other circulatory complications: Secondary | ICD-10-CM | POA: Diagnosis not present

## 2023-04-28 DIAGNOSIS — F333 Major depressive disorder, recurrent, severe with psychotic symptoms: Secondary | ICD-10-CM | POA: Diagnosis not present

## 2023-04-29 ENCOUNTER — Encounter (HOSPITAL_COMMUNITY): Payer: Medicare Other | Admitting: Physician Assistant

## 2023-04-29 ENCOUNTER — Encounter: Payer: Self-pay | Admitting: Infectious Disease

## 2023-04-29 DIAGNOSIS — A498 Other bacterial infections of unspecified site: Secondary | ICD-10-CM

## 2023-04-29 DIAGNOSIS — A429 Actinomycosis, unspecified: Secondary | ICD-10-CM

## 2023-04-29 HISTORY — DX: Actinomycosis, unspecified: A42.9

## 2023-04-29 HISTORY — DX: Other bacterial infections of unspecified site: A49.8

## 2023-04-29 LAB — LAB REPORT - SCANNED: EGFR (Non-African Amer.): 76

## 2023-04-29 NOTE — Progress Notes (Unsigned)
 Subjective:  Complaint follow-up for infection after intraosseous catheter with consequent polymicrobial infection  Patient ID: [name], female    DOB: 12/22/1965, 58 y.o.   MRN: [id number]  HPI   58 yo female hx advance heart failure, dm2, recent prolonged admission 12/05/22-12/30/22 for vf cardiac arrest and left knee inter-osseous catheter that complicated with chronic wound here for f/u wound infection   She was seen by Dr. Elinor Parkinson in clinic after she had gone debridement of [name] wound with placement of myriad tissue replacement matrix on 12/12 due to concerns of infection with exposed tendon. Or cx with E faecalis, Actinomyces spp, Actinomyces europaea.   Visit she was accompanied by her husband and said that she had been on her second round of ciprofloxacin.   In that visit she was prescribed linezolid and referred to allergy immunology for amoxicillin challenge.  Well with amoxicillin challenge and had no adverse reactions.  She is then seen by Dr. Renold Don on 02/28/23   ------------- 02/28/23 id initial clinic visit with me  01/27/23 seen by pcp and there is a ?swab wound cx that grew pseudomonas (cipro mic 0.5 sensitive; levo 2 resistant) t. She had another I&D by plastic surgery 12/30.    Patient was taking linezolid only and had finished cipro   Has no imaging but given the interosseous site there was concern for osteomyelitis     I also reviewed operative note from 12/12 and 02/24/23 of dr Weyman Croon of plastic surgery 12/12: Didn't appear the debridement needed to go to the bone. Tendon exposed as mentioned above 12/30: Tissue healing but patella tendon remains exposed. I&D done and more tissue matrix placed over the tendon. No operative culture sent   She reported sharp/shooting pain in the area. No f/c. No n/v/d. No blurry vision. No numbness/tingling   Also wears life vest now waiting for icd placement which await clearance of Infection       04/08/23 id  clinic f/u Patient had picc placed 2/5 and started on piptazo; amoxicillin hold From previous discussion with plastic surgery would like to treat for pseudomonas colonization with abx in setting planned placement of graft over the exposed tendon.    The patient was taken to the operating room by Dr. Weyman Croon with plastic surgery on 18 February.  The patient had remaining outstanding wound healing with decrease in wound size approximately 50%.  She had exposed patellar tendon the dressings had a green hue which concerned him for potential Pseudomonas infection.  He therefore did not place Integra over the tendon during that surgery.  He did perform debridement of the anterior surface of the tendon which was 4.5 x 3 x 0.1 cm the necrotic portion tendon was sent for culture second culture was also taken from wound.  The entire was then irrigated saline and VAS HE.  Dressing was applied and the patient was awakened.  Intraoperative cultures have subsequent yielded Pseudomonas aeruginosa that was resistant to ceftazidime from culture within the tendon.  The other culture did not yield an organism.  She has remained on Zosyn.  When Dr. Bartholomew Crews was made aware of the culture results extended her Zosyn to 26 May 2023.  Patient arrived in clinic again 25 minutes late.  She made it appear that she was in so much pain that was difficult to walk from the clinic entrance to the exam room.  She complained quite a bit about her pain being poorly controlled and her being intolerant of the  oxycodone that she had been prescribed.  She also says she feels overwhelmed with everything with regards to her health and condition.  She complained of pain in multiple areas in the body she also was concerned about an area where a cystic lesion on her scalp where she is exquisitely tender palpation exam this area do not believe that this is infected likely represents a slight bruise.  She is wanting to have her pills in  a liquid form that we going to switch to once we get off IV antibiotics.  I told her I would look into this but that it would likely be much more complicated to do liquid forms of ciprofloxacin and amoxicillin.-or augmentin  We will likely run into problems with either prior authorization and/or need for frequent trips to the pharmacy with the fact that these forms of antibiotics are typically made for children at lower doses than what she would be given.     Past Medical History:  Diagnosis Date   ADD (attention deficit disorder)    Anxiety    Arthritis    both knees right worse than left   Carpal tunnel syndrome of right wrist    Chronic back pain    Dysrhythmia    history of Vtach   Essential hypertension, benign    Gallstones    History of smoking 06/22/2016   Hyperlipidemia associated with type 2 diabetes mellitus (HCC), on Zocor 04/04/2011   Hypertension associated with diabetes (HCC) 06/03/2008   MDD (major depressive disorder)    Memory impairment    post cardiac arrest   Migraines    Morbid obesity (HCC)    Morbid obesity with BMI of 50.0-59.9, adult (HCC) 07/12/2006   NICM (nonischemic cardiomyopathy) (HCC) 07/12/2006   01/16/18 ECHO:    - Procedure narrative: Transthoracic echocardiography. Image   quality was suboptimal. The study was technically difficult.   Intravenous contrast (Definity) was administered. - Left ventricle: The cavity size was moderately dilated. Wall   thickness was increased in a pattern of mild LVH. Systolic   function was moderately to severely reduced. The estimated   ejection fraction w   Normal coronary arteries 04/17/2016   OSA on CPAP 10/28/2014   does not use CPAP- pt cannot tolerate CPAP   Spinal stenosis    back pain   Spondylosis without myelopathy or radiculopathy, lumbar region 12/04/2017   Type 2 diabetes mellitus with hyperglycemia (HCC)    Walker as ambulation aid     Past Surgical History:  Procedure Laterality Date    CHOLECYSTECTOMY     DILATATION & CURETTAGE/HYSTEROSCOPY WITH MYOSURE N/A 10/31/2017   Procedure: DILATATION & CURETTAGE/HYSTEROSCOPY WITH MYOSURE;  Surgeon: Romualdo Bolk, MD;  Location: [facility];  Service: Gynecology;  Laterality: N/A;   HYSTEROSCOPY WITH D & C N/A 05/07/2022   Procedure: DILATATION AND CURETTAGE /HYSTEROSCOPY;  Surgeon: [name], MD;  Location: [facility] OR;  Service: Gynecology;  Laterality: N/A;   INCISION AND DRAINAGE OF WOUND Left 02/06/2023   Procedure: debridement of left leg wound, application of myriad;  Surgeon: [name], MD;  Location: [facility] OR;  Service: Plastics;  Laterality: Left;   INCISION AND DRAINAGE OF WOUND Left 02/24/2023   Procedure: application of tissue replacement matrix of left patellar tendon;  Surgeon: [name], MD;  Location: [facility] OR;  Service: Plastics;  Laterality: Left;   INCISION AND DRAINAGE OF WOUND Left 04/15/2023   Procedure: irrigation and debridement to left lower extremity wound;  Surgeon:  [name], MD;  Location: [facility] OR;  Service: Plastics;  Laterality: Left;   IRRIGATION AND DEBRIDEMENT KNEE  02/24/2023   Procedure: IRRIGATION AND DEBRIDEMENT KNEE;  Surgeon: [name], MD;  Location: [facility] OR;  Service: Plastics;;   life vest     RIGHT/LEFT HEART CATH AND CORONARY ANGIOGRAPHY N/A 04/11/2016   Procedure: Right/Left Heart Cath and Coronary Angiography;  Surgeon: Kathleene Hazel, MD;  Location: [facility] INVASIVE CV LAB;  Service: Cardiovascular;  Laterality: N/A;   RIGHT/LEFT HEART CATH AND CORONARY ANGIOGRAPHY N/A 12/09/2022   Procedure: RIGHT/LEFT HEART CATH AND CORONARY ANGIOGRAPHY;  Surgeon: Yates Decamp, MD;  Location: [facility] INVASIVE CV LAB;  Service: Cardiovascular;  Laterality: N/A;   sonogram for blood clots     no blockages    Family History  Problem Relation Age of Onset   Depression Mother    Anxiety disorder Mother    Diabetes Mother    Hypertension Mother    ADD / ADHD Father    Diabetes  Father    Hypertension Father    Colitis Father    Hypertension Other    Diabetes Other    Colitis Other    Alcohol abuse Other       Social History   Socioeconomic History   Marital status: Married    Spouse name: [name]   Number of children: 1   Years of education: 16   Highest education level: Associate degree: academic program  Occupational History   Not on file  Tobacco Use   Smoking status: Former    Current packs/day: 0.00    Types: Cigarettes    Quit date: 02/25/1997    Years since quitting: 26.1   Smokeless tobacco: Never   Tobacco comments:    Married, lives with spouse (when he is not traveling) and son  Vaping Use   Vaping status: Never Used  Substance and Sexual Activity   Alcohol use: No   Drug use: No   Sexual activity: Not Currently    Partners: Male    Birth control/protection: None  Other Topics Concern   Not on file  Social History Narrative   Right handed      Two story home   Lives with husband   No caffeine   Disablitiy    Social Drivers of Health   Financial Resource Strain: Low Risk  (02/10/2023)   Overall Financial Resource Strain (CARDIA)    Difficulty of Paying Living Expenses: Not hard at all  Food Insecurity: No Food Insecurity (02/10/2023)   Hunger Vital Sign    Worried About Running Out of Food in the Last Year: Never true    Ran Out of Food in the Last Year: Never true  Transportation Needs: No Transportation Needs (02/10/2023)   PRAPARE - Administrator, Civil Service (Medical): No    Lack of Transportation (Non-Medical): No  Physical Activity: Inactive (03/18/2023)   Exercise Vital Sign    Days of Exercise per Week: 0 days    Minutes of Exercise per Session: 0 min  Stress: Stress Concern Present (03/18/2023)   [employer] of Occupational Health - Occupational Stress Questionnaire    Feeling of Stress : Very much  Social Connections: Socially Isolated (02/10/2023)   Social Connection and Isolation Panel  [NHANES]    Frequency of Communication with Friends and Family: Once a week    Frequency of Social Gatherings with Friends and Family: Once a week    Attends Religious Services:  Never    Active Member of Clubs or Organizations: No    Attends Banker Meetings: Never    Marital Status: Married    Allergies  Allergen Reactions   Fluoxetine Other (See Comments)    More depressed   Cymbalta [Duloxetine Hcl] Other (See Comments)    depressed   Pork-Derived Products Other (See Comments)    Religious beliefs- does not eat pork  Medications ok if necessary     Current Outpatient Medications:    Accu-Chek Softclix Lancets lancets, Use as instructed, Disp: 100 each, Rfl: 12   acetaminophen (TYLENOL) 325 MG tablet, Take 1-2 tablets (325-650 mg total) by mouth every 4 (four) hours as needed for mild pain (pain score 1-3)., Disp: 100 tablet, Rfl: 0   AMBULATORY NON FORMULARY MEDICATION, Off-loading wheel chair seat cusion Dispense 1 Dx code: M53.3 Use as needed, Disp: 1 Units, Rfl: 0   atorvastatin (LIPITOR) 80 MG tablet, TAKE 1 TABLET BY MOUTH EVERY DAY, Disp: 90 tablet, Rfl: 0   Blood Glucose Monitoring Suppl DEVI, 1 each by Does not apply route in the morning, at noon, and at bedtime. May substitute to any manufacturer covered by patient's insurance., Disp: 1 each, Rfl: 0   collagenase (SANTYL) 250 UNIT/GM ointment, Apply 1 Application topically daily., Disp: 15 g, Rfl: 0   dapagliflozin propanediol (FARXIGA) 10 MG TABS tablet, Take 1 tablet (10 mg total) by mouth daily., Disp: 90 tablet, Rfl: 1   fluticasone (FLONASE) 50 MCG/ACT nasal spray, SPRAY 2 SPRAYS INTO EACH NOSTRIL EVERY DAY, Disp: 48 mL, Rfl: 0   furosemide (LASIX) 40 MG tablet, Take 40mg  twice daily for 3 days then 40mg  daily. (Patient taking differently: Take 40 mg by mouth daily.), Disp: 100 tablet, Rfl: 1   gentian violet (GNP GENTIAN VIOLET) 1 % topical solution, Apply topically to affected area every 2-3 days as  needed. May wash off in between applications., Disp: 59 mL, Rfl: 0   Glucose Blood (BLOOD GLUCOSE TEST STRIPS) STRP, 1 each by In Vitro route in the morning, at noon, and at bedtime. May substitute to any manufacturer covered by patient's insurance., Disp: 100 strip, Rfl: 11   hydrOXYzine (ATARAX) 25 MG tablet, Take 1 tablet (25 mg total) by mouth 3 (three) times daily as needed., Disp: 90 tablet, Rfl: 1   ketoconazole (NIZORAL) 2 % cream, Apply 1 Application topically daily. Apply 1gm to skin on plantar aspect of affected foot, and massage in well.  Apply once daily., Disp: 60 g, Rfl: 2   LORazepam (ATIVAN) 0.5 MG tablet, Take 1 tablet (0.5 mg total) by mouth 2 (two) times daily as needed for anxiety., Disp: 30 tablet, Rfl: 0   losartan (COZAAR) 25 MG tablet, Take 0.5 tablets (12.5 mg total) by mouth daily., Disp: 90 tablet, Rfl: 1   magnesium oxide (MAG-OX) 400 MG tablet, Take 1 tablet (400 mg total) by mouth at bedtime., Disp: 90 tablet, Rfl: 1   metFORMIN (GLUCOPHAGE-XR) 500 MG 24 hr tablet, Take 1 tablet (500 mg total) by mouth 2 (two) times daily with a meal., Disp: 180 tablet, Rfl: 1   metoprolol succinate (TOPROL-XL) 50 MG 24 hr tablet, Take 1 tablet (50 mg total) by mouth daily. Take with or immediately following a meal., Disp: 90 tablet, Rfl: 1   NON FORMULARY, Pt uses cpap nightly, Disp: , Rfl:    ondansetron (ZOFRAN-ODT) 4 MG disintegrating tablet, Take 1 tablet (4 mg total) by mouth every 8 (eight) hours as needed for  nausea or vomiting., Disp: 60 tablet, Rfl: 1   Oxycodone HCl 10 MG TABS, Take 1 tablet (10 mg total) by mouth 3 (three) times daily as needed., Disp: 90 tablet, Rfl: 0   piperacillin-tazobactam (ZOSYN) 4.5 (4-0.5) g injection, Inject 10 g into the vein over 24 hr., Disp: , Rfl:    polyethylene glycol powder (GLYCOLAX/MIRALAX) 17 GM/SCOOP powder, Take 1 capful (17 g) in water 2 (two) times daily., Disp: 765 g, Rfl: 0   potassium chloride SA (KLOR-CON M) 20 MEQ tablet, Take 1  tablet (20 mEq total) by mouth daily., Disp: 90 tablet, Rfl: 1   senna-docusate (SENOKOT-S) 8.6-50 MG tablet, Take 2 tablets by mouth at bedtime., Disp: 60 tablet, Rfl: 0   spironolactone (ALDACTONE) 25 MG tablet, Take 0.5 tablets (12.5 mg total) by mouth daily., Disp: 90 tablet, Rfl: 1   vortioxetine HBr (TRINTELLIX) 5 MG TABS tablet, Take 1 tablet (5 mg total) by mouth daily., Disp: 30 tablet, Rfl: 1    Review of Systems  Constitutional:  Negative for activity change, appetite change, chills, diaphoresis, fatigue, fever and unexpected weight change.  HENT:  Negative for congestion, rhinorrhea, sinus pressure, sneezing, sore throat and trouble swallowing.   Eyes:  Negative for photophobia and visual disturbance.  Respiratory:  Negative for cough, chest tightness, shortness of breath, wheezing and stridor.   Cardiovascular:  Negative for chest pain, palpitations and leg swelling.  Gastrointestinal:  Negative for abdominal distention, abdominal pain, anal bleeding, blood in stool, constipation, diarrhea, nausea and vomiting.  Genitourinary:  Negative for difficulty urinating, dysuria, flank pain and hematuria.  Musculoskeletal:  Positive for arthralgias and myalgias. Negative for back pain, gait problem and joint swelling.  Skin:  Positive for wound. Negative for color change, pallor and rash.  Neurological:  Negative for dizziness, tremors, weakness and light-headedness.  Hematological:  Negative for adenopathy. Does not bruise/bleed easily.  Psychiatric/Behavioral:  Positive for dysphoric mood. Negative for agitation, behavioral problems, confusion, decreased concentration and sleep disturbance. The patient is nervous/anxious.        Objective:   Physical Exam Constitutional:      General: She is not in acute distress.    Appearance: She is not diaphoretic.  HENT:     Head: Normocephalic and atraumatic.     Right Ear: External ear normal.     Left Ear: External ear normal.     Nose:  Nose normal.     Mouth/Throat:     Pharynx: No oropharyngeal exudate.  Eyes:     General: No scleral icterus.       Right eye: No discharge.        Left eye: No discharge.     Extraocular Movements: Extraocular movements intact.     Conjunctiva/sclera: Conjunctivae normal.  Cardiovascular:     Rate and Rhythm: Normal rate and regular rhythm.     Heart sounds:     No friction rub.  Pulmonary:     Effort: Pulmonary effort is normal. No respiratory distress.     Breath sounds: No wheezing or rales.  Abdominal:     General: There is no distension.     Palpations: Abdomen is soft.     Tenderness: There is no rebound.  Musculoskeletal:        General: No tenderness. Normal range of motion.     Cervical back: Normal range of motion and neck supple.  Lymphadenopathy:     Cervical: No cervical adenopathy.  Skin:    General: Skin is  warm and dry.     Coloration: Skin is not jaundiced or pale.     Findings: No erythema, lesion or rash.  Neurological:     General: No focal deficit present.     Mental Status: She is alert and oriented to person, place, and time.     Coordination: Coordination normal.  Psychiatric:        Mood and Affect: Mood is anxious and depressed. Affect is labile.        Speech: Speech normal.        Behavior: Behavior normal.        Thought Content: Thought content normal.        Cognition and Memory: Cognition and memory normal.        Judgment: Judgment normal.     Left leg bandaged though I examined a photo that the husband showed me largely granulating tissue with exception of the exposed tendon  Scalp        Assessment & Plan:   Infected site of inter-osseous catheter with necrotic tendon and concern for possible osteomyelitis  Will complete her Zosyn at the end of the month.  I would like to follow that with either amoxicillin or Augmentin to cover the actinomyces and the Enterococcus faecalis.  The former would require a year of penicillin  therapy regardless of whether she has osteomyelitis  I personally think we should err on the side of also tarting the Pseudomonas had since it was found and necrotic tendon and place her on ciprofloxacin twice daily.  In discussions with the pharmacy and I think there are several issues with trying to get liquid formulations of these drugs as mentioned they might likely require prior authorizations and the quantity of liquid would potentially mean that we difficult to procure from the pharmacy sufficient supplies for a month at a time.  Perhaps some other non-essential for time being meds could be subracted from "all of the pills I have to take  I will send cipro and amoxicillin into her pharmacy in pill form for now  Pain: appears poorly controlled though there is an emotional component of this.  I will send my note to Dr. Yetta Barre I have encouraged her to reach out to him for pain management.  Anxiety: She also seems to have poorly controlled anxiety and depression and perhaps might benefit from optimization of this as well.  Area on her scalp: I think this is a bruise  I have personally spent 42  minutes involved in face-to-face and non-face-to-face activities for this patient on the day of the visit. Professional time spent includes the following activities: Preparing to see the patient (review of tests), Obtaining and/or reviewing separately obtained history (admission/discharge record), Performing a medically appropriate examination and/or evaluation , Ordering medications/tests/procedures, referring and communicating with other health care professionals, Documenting clinical information in the EMR, Independently interpreting results (not separately reported), Communicating results to the patient/family/caregiver, Counseling and educating the patient/family/caregiver and Care coordination (not separately reported).

## 2023-04-30 ENCOUNTER — Ambulatory Visit (INDEPENDENT_AMBULATORY_CARE_PROVIDER_SITE_OTHER): Payer: Medicare Other | Admitting: Infectious Disease

## 2023-04-30 ENCOUNTER — Other Ambulatory Visit: Payer: Self-pay | Admitting: Cardiovascular Disease

## 2023-04-30 ENCOUNTER — Other Ambulatory Visit: Payer: Self-pay

## 2023-04-30 VITALS — BP 140/90 | HR 92 | Temp 98.0°F

## 2023-04-30 DIAGNOSIS — I469 Cardiac arrest, cause unspecified: Secondary | ICD-10-CM

## 2023-04-30 DIAGNOSIS — A498 Other bacterial infections of unspecified site: Secondary | ICD-10-CM

## 2023-04-30 DIAGNOSIS — Z9889 Other specified postprocedural states: Secondary | ICD-10-CM | POA: Diagnosis not present

## 2023-04-30 DIAGNOSIS — L989 Disorder of the skin and subcutaneous tissue, unspecified: Secondary | ICD-10-CM

## 2023-04-30 DIAGNOSIS — A429 Actinomycosis, unspecified: Secondary | ICD-10-CM

## 2023-04-30 DIAGNOSIS — F32A Depression, unspecified: Secondary | ICD-10-CM

## 2023-04-30 DIAGNOSIS — F419 Anxiety disorder, unspecified: Secondary | ICD-10-CM

## 2023-04-30 DIAGNOSIS — Z79899 Other long term (current) drug therapy: Secondary | ICD-10-CM

## 2023-04-30 DIAGNOSIS — R52 Pain, unspecified: Secondary | ICD-10-CM | POA: Diagnosis not present

## 2023-04-30 MED ORDER — CIPROFLOXACIN HCL 750 MG PO TABS: 750.0000 mg | ORAL_TABLET | Freq: Two times a day (BID) | ORAL | 2 refills | Status: DC

## 2023-04-30 MED ORDER — AMOXICILLIN-POT CLAVULANATE 875-125 MG PO TABS
1.0000 | ORAL_TABLET | Freq: Two times a day (BID) | ORAL | 11 refills | Status: DC
Start: 2023-04-30 — End: 2023-06-09

## 2023-05-01 ENCOUNTER — Telehealth: Payer: Self-pay

## 2023-05-01 NOTE — Telephone Encounter (Signed)
 Patient's spouse called wanting to clarify [name]'s antibiotic plan. He was unsure if she was supposed to start the oral antibiotics now or wait until she finishes IV antibiotics on 3/31.  Sandie Ano, RN

## 2023-05-01 NOTE — Telephone Encounter (Signed)
 Called patient's spouse, [name], and relayed that [name] is to wait to start the oral antibiotics until she finishes the IV Zosyn on 3/31. He verbalized understanding.   Sandie Ano, RN

## 2023-05-01 NOTE — Telephone Encounter (Signed)
 Patient's husband called back. Relayed to him that Dr. Clinton Gallant note indicates she should not start oral antibiotics until she finishes the IV Zosyn.   He states he's just confused because the pharmacy gave him the oral medications yesterday.   He would like to clarify with Dr. Daiva Eves. Notified him that office will call back as soon as Dr. Daiva Eves has a moment to respond, but that he is not in clinic right now.   Sandie Ano, RN

## 2023-05-01 NOTE — Telephone Encounter (Signed)
 Per Dr. Daiva Eves pull picc orders forwarded to [facility]. EOT 05/26/23. Juanita Laster, RMA

## 2023-05-02 DIAGNOSIS — B9689 Other specified bacterial agents as the cause of diseases classified elsewhere: Secondary | ICD-10-CM | POA: Diagnosis not present

## 2023-05-02 DIAGNOSIS — T8149XA Infection following a procedure, other surgical site, initial encounter: Secondary | ICD-10-CM | POA: Diagnosis not present

## 2023-05-02 DIAGNOSIS — I5042 Chronic combined systolic (congestive) and diastolic (congestive) heart failure: Secondary | ICD-10-CM | POA: Diagnosis not present

## 2023-05-02 DIAGNOSIS — I152 Hypertension secondary to endocrine disorders: Secondary | ICD-10-CM | POA: Diagnosis not present

## 2023-05-02 DIAGNOSIS — E1159 Type 2 diabetes mellitus with other circulatory complications: Secondary | ICD-10-CM | POA: Diagnosis not present

## 2023-05-02 DIAGNOSIS — F333 Major depressive disorder, recurrent, severe with psychotic symptoms: Secondary | ICD-10-CM | POA: Diagnosis not present

## 2023-05-03 DIAGNOSIS — F333 Major depressive disorder, recurrent, severe with psychotic symptoms: Secondary | ICD-10-CM | POA: Diagnosis not present

## 2023-05-03 DIAGNOSIS — L89151 Pressure ulcer of sacral region, stage 1: Secondary | ICD-10-CM | POA: Diagnosis not present

## 2023-05-03 DIAGNOSIS — E1159 Type 2 diabetes mellitus with other circulatory complications: Secondary | ICD-10-CM | POA: Diagnosis not present

## 2023-05-03 DIAGNOSIS — I428 Other cardiomyopathies: Secondary | ICD-10-CM | POA: Diagnosis not present

## 2023-05-03 DIAGNOSIS — F988 Other specified behavioral and emotional disorders with onset usually occurring in childhood and adolescence: Secondary | ICD-10-CM | POA: Diagnosis not present

## 2023-05-03 DIAGNOSIS — G8929 Other chronic pain: Secondary | ICD-10-CM | POA: Diagnosis not present

## 2023-05-03 DIAGNOSIS — B9689 Other specified bacterial agents as the cause of diseases classified elsewhere: Secondary | ICD-10-CM | POA: Diagnosis not present

## 2023-05-03 DIAGNOSIS — Z6841 Body Mass Index (BMI) 40.0 and over, adult: Secondary | ICD-10-CM | POA: Diagnosis not present

## 2023-05-03 DIAGNOSIS — M170 Bilateral primary osteoarthritis of knee: Secondary | ICD-10-CM | POA: Diagnosis not present

## 2023-05-03 DIAGNOSIS — E785 Hyperlipidemia, unspecified: Secondary | ICD-10-CM | POA: Diagnosis not present

## 2023-05-03 DIAGNOSIS — G43909 Migraine, unspecified, not intractable, without status migrainosus: Secondary | ICD-10-CM | POA: Diagnosis not present

## 2023-05-03 DIAGNOSIS — E669 Obesity, unspecified: Secondary | ICD-10-CM | POA: Diagnosis not present

## 2023-05-03 DIAGNOSIS — M4800 Spinal stenosis, site unspecified: Secondary | ICD-10-CM | POA: Diagnosis not present

## 2023-05-03 DIAGNOSIS — M51369 Other intervertebral disc degeneration, lumbar region without mention of lumbar back pain or lower extremity pain: Secondary | ICD-10-CM | POA: Diagnosis not present

## 2023-05-03 DIAGNOSIS — I4901 Ventricular fibrillation: Secondary | ICD-10-CM | POA: Diagnosis not present

## 2023-05-03 DIAGNOSIS — I152 Hypertension secondary to endocrine disorders: Secondary | ICD-10-CM | POA: Diagnosis not present

## 2023-05-03 DIAGNOSIS — M47816 Spondylosis without myelopathy or radiculopathy, lumbar region: Secondary | ICD-10-CM | POA: Diagnosis not present

## 2023-05-03 DIAGNOSIS — E1169 Type 2 diabetes mellitus with other specified complication: Secondary | ICD-10-CM | POA: Diagnosis not present

## 2023-05-03 DIAGNOSIS — I5042 Chronic combined systolic (congestive) and diastolic (congestive) heart failure: Secondary | ICD-10-CM | POA: Diagnosis not present

## 2023-05-03 DIAGNOSIS — G4733 Obstructive sleep apnea (adult) (pediatric): Secondary | ICD-10-CM | POA: Diagnosis not present

## 2023-05-03 DIAGNOSIS — D539 Nutritional anemia, unspecified: Secondary | ICD-10-CM | POA: Diagnosis not present

## 2023-05-03 DIAGNOSIS — G5601 Carpal tunnel syndrome, right upper limb: Secondary | ICD-10-CM | POA: Diagnosis not present

## 2023-05-03 DIAGNOSIS — I252 Old myocardial infarction: Secondary | ICD-10-CM | POA: Diagnosis not present

## 2023-05-03 DIAGNOSIS — T8149XA Infection following a procedure, other surgical site, initial encounter: Secondary | ICD-10-CM | POA: Diagnosis not present

## 2023-05-03 DIAGNOSIS — I080 Rheumatic disorders of both mitral and aortic valves: Secondary | ICD-10-CM | POA: Diagnosis not present

## 2023-05-05 NOTE — Progress Notes (Signed)
   Referring Provider [name], MD [address] [place],  [place] [address]   CC: No chief complaint on file.     [name] is an 58 y.o. female.  HPI: Patient is a 58 year old female with history of a large wound to her left lower extremity.  She has underwent several debridements, with her most recent debridement being on 04/15/2023 with Dr. Ladona Ridgel.  Intraoperatively, it was noted that patient's wound had been healing very well, but the dressings had a green hue to them consistent with ongoing Pseudomonas infection.  For this reason, Integra was not placed over the tendon.  Debridement was performed on the anterior surface of the tendon.  Cultures were taken from the wound.  Patient presents to the clinic today for further management of her wound.  Per chart review, culture of the patella tendon showed Pseudomonas.  Culture of the wound tissue had no growth.  Patient was last seen in the clinic by Dr. Ladona Ridgel on 04/21/2023.  The remainder of the wound was noted to be clear to Pseudomonas, but there was still Pseudomonas within the patellar tendon.  On exam, the remainder of the wound was healing very nicely.  Patient had reepithelialized and all of the edges around the wound and the wound was contracting as expected.  Except for a small area over the patellar tendon, there is good granulation tissue everywhere.  Frequency of dressing changes were changed to twice daily.  Also per infectious disease, antibiotics will continue through March.  Today,  Review of Systems General: ***  Physical Exam    04/30/2023    3:12 PM 04/21/2023    9:09 AM 04/15/2023    4:00 PM  Vitals with BMI  Weight  253 lbs 10 oz   BMI  43.51   Systolic 140 124 409  Diastolic 90 84 95  Pulse 92 94 76    General:  No acute distress,  Alert and oriented, Non-Toxic, Normal speech and affect ***   Assessment/Plan ***  [name] 05/05/2023, 10:34 AM

## 2023-05-06 ENCOUNTER — Ambulatory Visit (INDEPENDENT_AMBULATORY_CARE_PROVIDER_SITE_OTHER): Payer: Medicare Other | Admitting: Student

## 2023-05-06 ENCOUNTER — Encounter: Payer: Self-pay | Admitting: Student

## 2023-05-06 ENCOUNTER — Ambulatory Visit: Payer: Self-pay | Admitting: Internal Medicine

## 2023-05-06 VITALS — BP 116/78 | HR 89 | Ht 64.0 in | Wt 246.0 lb

## 2023-05-06 DIAGNOSIS — S81802D Unspecified open wound, left lower leg, subsequent encounter: Secondary | ICD-10-CM | POA: Diagnosis not present

## 2023-05-06 DIAGNOSIS — F333 Major depressive disorder, recurrent, severe with psychotic symptoms: Secondary | ICD-10-CM | POA: Diagnosis not present

## 2023-05-06 DIAGNOSIS — E1159 Type 2 diabetes mellitus with other circulatory complications: Secondary | ICD-10-CM | POA: Diagnosis not present

## 2023-05-06 DIAGNOSIS — I152 Hypertension secondary to endocrine disorders: Secondary | ICD-10-CM | POA: Diagnosis not present

## 2023-05-06 DIAGNOSIS — T8149XA Infection following a procedure, other surgical site, initial encounter: Secondary | ICD-10-CM | POA: Diagnosis not present

## 2023-05-06 DIAGNOSIS — B9689 Other specified bacterial agents as the cause of diseases classified elsewhere: Secondary | ICD-10-CM | POA: Diagnosis not present

## 2023-05-06 DIAGNOSIS — I5042 Chronic combined systolic (congestive) and diastolic (congestive) heart failure: Secondary | ICD-10-CM | POA: Diagnosis not present

## 2023-05-06 NOTE — Telephone Encounter (Signed)
 Chief Complaint: lump on back of head/worsened pain Symptoms: worsened pain, lump on head Frequency: n/a Pertinent Negatives: Patient denies n/a Disposition: [] ED /[] Urgent Care (no appt availability in office) / [x] Appointment(In office/virtual)/ []  [facility]/ [] Home Care/ [] Refused Recommended Disposition /[]  Mobile Bus/ []  Follow-up with PCP Additional Notes: Patient, with assistance from [facility] Nurse, called in to report patient requesting appt asap for worsened pain and a new cyst like bump on the back of her head (bottom left side). Patient reports unknown cause of bump on head, stating she hasn't fallen or had injury. Home Health Nurse states bump is skin colored and hard to the touch, about the size of a marble. Patient encouraged by [facility] nurse to be evaluated asap by HCP.    Copied from CRM [id number]. Topic: Appointments - Appointment Scheduling >> May 06, 2023  1:42 PM Isabelle Course C wrote: Patient/patient representative is calling to schedule an appointment. Devon from [facility] stated that Patient is in pain all over. She is having issues with her legs, shoulder and has a knot behind her head. She is in a lot of pain and can't wait to be seen Reason for Disposition  [1] Small swelling or lump AND [2] unexplained AND [3] present > 1 week  Answer Assessment - Initial Assessment Questions 1. APPEARANCE of SWELLING: "What does it look like?"     Feels hard 2. SIZE: "How large is the swelling?" (e.g., inches, cm; or compare to size of pinhead, tip of pen, eraser, coin, pea, grape, ping pong ball)      Marble 3. LOCATION: "Where is the swelling located?"     Back left side of head, behind ear at nape of neck 4. ONSET: "When did the swelling start?"     A week 5. COLOR: "What color is it?" "Is there more than one color?"     Skin tone 6. PAIN: "Is there any pain?" If Yes, ask: "How bad is the pain?" (e.g., scale 1-10; or mild, moderate, severe)     - NONE (0): no  pain   - MILD (1-3): doesn't interfere with normal activities    - MODERATE (4-7): interferes with normal activities or awakens from sleep    - SEVERE (8-10): excruciating pain, unable to do any normal activities     Severe 7. ITCH: "Does it itch?" If Yes, ask: "How bad is the itch?"      No 8. CAUSE: "What do you think caused the swelling?" Unknown, denies fall  9 OTHER SYMPTOMS: "Do you have any other symptoms?" (e.g., fever)     Sensitivity to movement  Protocols used: Skin Lump or Localized Swelling-A-AH

## 2023-05-07 ENCOUNTER — Encounter: Payer: Self-pay | Admitting: Internal Medicine

## 2023-05-07 ENCOUNTER — Telehealth: Payer: Self-pay

## 2023-05-07 ENCOUNTER — Ambulatory Visit: Admitting: Internal Medicine

## 2023-05-07 VITALS — BP 136/82 | HR 112 | Temp 98.2°F | Ht 64.0 in

## 2023-05-07 DIAGNOSIS — R221 Localized swelling, mass and lump, neck: Secondary | ICD-10-CM

## 2023-05-07 DIAGNOSIS — G629 Polyneuropathy, unspecified: Secondary | ICD-10-CM

## 2023-05-07 DIAGNOSIS — B3731 Acute candidiasis of vulva and vagina: Secondary | ICD-10-CM | POA: Diagnosis not present

## 2023-05-07 DIAGNOSIS — G894 Chronic pain syndrome: Secondary | ICD-10-CM | POA: Diagnosis not present

## 2023-05-07 MED ORDER — HYDROCODONE-ACETAMINOPHEN 10-325 MG PO TABS: 1.0000 | ORAL_TABLET | Freq: Three times a day (TID) | ORAL | 0 refills | Status: AC | PRN

## 2023-05-07 MED ORDER — GABAPENTIN 300 MG PO CAPS: 300.0000 mg | ORAL_CAPSULE | Freq: Every day | ORAL | 1 refills | Status: DC

## 2023-05-07 MED ORDER — TERCONAZOLE 80 MG VA SUPP: 80.0000 mg | Freq: Every day | VAGINAL | 2 refills | Status: DC

## 2023-05-07 NOTE — Patient Instructions (Addendum)
     If the lump in the back of your neck does not go away we can order an ultrasound to evaluate    Medications changes include :   hydrocodone-acetaminophen 10-325 mg every 8 hours as needed.  Gabapentin 300 mg at bedtime.  Vaginal suppository for yeast infection   Take colace, miralax twice daily, prune juice, benefiber for the constipation.       Return for follow up with Dr Yetta Barre.

## 2023-05-07 NOTE — Progress Notes (Signed)
 Subjective:    Patient ID: [name], female    DOB: [birth year], 57 y.o.   MRN: [id number]      HPI [name] is here for  Chief Complaint  Patient presents with   Tailbone Pain   Right arm    Right arm pain    Mass    Lump on left side of her back ear   She presents with multiple complaints.  She is upset because her pain is not controlled and she is not able to see pain management until May.  Was given oxycodone - it does not work for her and has never worked for her.  Has pain all over.  Everything hurts.  She wants something different for her pain that will help.  She is very upset that she is not able to see pain management sooner.  Has neuropathy.  She has pain, her feet get ice cold.  Thinks she was on gabapentin - she does not think it worked for her.  She thinks she may have also been on Lyrica.  She does not tolerate duloxetine.  She sleeps on couch.  Rotates side to side because of her chronic pain.  Wakes up with neck pain.  She has a knot on the left side of her upper neck - it is very painful.  Has been there about one week.  She denies any other palpable lumps in her neck.   She is hypersensitive to smells - has to go outside when he husband cooks.  Can not taste her food.    Constipation is significant, has vaginal yeast infection and candidiasis of the skin she is on prolonged antibiotics   Medications and allergies reviewed with patient and updated if appropriate.  Current Outpatient Medications on File Prior to Visit  Medication Sig Dispense Refill   Accu-Chek Softclix Lancets lancets Use as instructed 100 each 12   acetaminophen (TYLENOL) 325 MG tablet Take 1-2 tablets (325-650 mg total) by mouth every 4 (four) hours as needed for mild pain (pain score 1-3). 100 tablet 0   AMBULATORY NON FORMULARY MEDICATION Off-loading wheel chair seat cusion Dispense 1 Dx code: M53.3 Use as needed 1 Units 0   amoxicillin-clavulanate (AUGMENTIN) 875-125 MG tablet Take  1 tablet by mouth 2 (two) times daily. 60 tablet 11   atorvastatin (LIPITOR) 80 MG tablet TAKE 1 TABLET BY MOUTH EVERY DAY 90 tablet 0   Blood Glucose Monitoring Suppl DEVI 1 each by Does not apply route in the morning, at noon, and at bedtime. May substitute to any manufacturer covered by patient's insurance. 1 each 0   ciprofloxacin (CIPRO) 750 MG tablet Take 1 tablet (750 mg total) by mouth 2 (two) times daily. 60 tablet 2   collagenase (SANTYL) 250 UNIT/GM ointment Apply 1 Application topically daily. 15 g 0   dapagliflozin propanediol (FARXIGA) 10 MG TABS tablet Take 1 tablet (10 mg total) by mouth daily. 90 tablet 1   fluticasone (FLONASE) 50 MCG/ACT nasal spray SPRAY 2 SPRAYS INTO EACH NOSTRIL EVERY DAY 48 mL 0   furosemide (LASIX) 40 MG tablet TAKE 2 TAB TWICE DAILY FOR 3 DAYS THEN 2 TAB DAILY. 180 tablet 3   gentian violet (GNP GENTIAN VIOLET) 1 % topical solution Apply topically to affected area every 2-3 days as needed. May wash off in between applications. 59 mL 0   Glucose Blood (BLOOD GLUCOSE TEST STRIPS) STRP 1 each by In Vitro route in the morning, at noon, and at  bedtime. May substitute to any manufacturer covered by patient's insurance. 100 strip 11   hydrOXYzine (ATARAX) 25 MG tablet Take 1 tablet (25 mg total) by mouth 3 (three) times daily as needed. 90 tablet 1   ketoconazole (NIZORAL) 2 % cream Apply 1 Application topically daily. Apply 1gm to skin on plantar aspect of affected foot, and massage in well.  Apply once daily. 60 g 2   LORazepam (ATIVAN) 0.5 MG tablet Take 1 tablet (0.5 mg total) by mouth 2 (two) times daily as needed for anxiety. 30 tablet 0   losartan (COZAAR) 25 MG tablet Take 0.5 tablets (12.5 mg total) by mouth daily. 90 tablet 1   magnesium oxide (MAG-OX) 400 MG tablet Take 1 tablet (400 mg total) by mouth at bedtime. 90 tablet 1   metFORMIN (GLUCOPHAGE-XR) 500 MG 24 hr tablet Take 1 tablet (500 mg total) by mouth 2 (two) times daily with a meal. 180 tablet 1    metoprolol succinate (TOPROL-XL) 50 MG 24 hr tablet Take 1 tablet (50 mg total) by mouth daily. Take with or immediately following a meal. 90 tablet 1   NON FORMULARY Pt uses cpap nightly     ondansetron (ZOFRAN-ODT) 4 MG disintegrating tablet Take 1 tablet (4 mg total) by mouth every 8 (eight) hours as needed for nausea or vomiting. 60 tablet 1   piperacillin-tazobactam (ZOSYN) 4.5 (4-0.5) g injection Inject 10 g into the vein over 24 hr.     polyethylene glycol powder (GLYCOLAX/MIRALAX) 17 GM/SCOOP powder Take 1 capful (17 g) in water 2 (two) times daily. 765 g 0   potassium chloride SA (KLOR-CON M) 20 MEQ tablet Take 1 tablet (20 mEq total) by mouth daily. 90 tablet 1   senna-docusate (SENOKOT-S) 8.6-50 MG tablet Take 2 tablets by mouth at bedtime. 60 tablet 0   spironolactone (ALDACTONE) 25 MG tablet Take 0.5 tablets (12.5 mg total) by mouth daily. 90 tablet 1   vortioxetine HBr (TRINTELLIX) 5 MG TABS tablet Take 1 tablet (5 mg total) by mouth daily. 30 tablet 1   No current facility-administered medications on file prior to visit.    Review of Systems  HENT:  Negative for ear pain, sinus pressure and sore throat.        Lump posterior neck - tender  Genitourinary:  Positive for vaginal discharge (vaginal itch).  Musculoskeletal:  Positive for arthralgias, back pain, myalgias, neck pain and neck stiffness.  Neurological:  Positive for numbness.  Psychiatric/Behavioral:  The patient is nervous/anxious.        Objective:   Vitals:   05/07/23 1640  BP: 136/82  Pulse: (!) 112  Temp: 98.2 F (36.8 C)  SpO2: 97%   BP Readings from Last 3 Encounters:  05/07/23 136/82  05/06/23 116/78  04/30/23 (!) 140/90   Wt Readings from Last 3 Encounters:  05/06/23 246 lb (111.6 kg)  04/21/23 253 lb 9.6 oz (115 kg)  04/15/23 250 lb (113.4 kg)   Body mass index is 42.23 kg/m.    Physical Exam Constitutional:      Appearance: Normal appearance.  Neck:     Comments: Palpable lump  left posterior left - tender, mobile Skin:    General: Skin is warm and dry.     Findings: No erythema.  Neurological:     Mental Status: She is alert.  Psychiatric:     Comments: anxious            Assessment & Plan:    Lump posterior  neck, Acute Noticed it one month ago Tender, mobile No overlying erythema No other palpable lumps in posterior or anterior neck ? Lymph node, ? Cyst, ? Lipoma Since she just noticed it one week ago - will monitor for now If this does not go down in the next couple of weeks or gets larger will order an ultrasound  Chronic pain syndrome: Chronic She has multiple things contributing to pain Has pain medicine appt in May Not currently taking anything - oxycodone has not helped and never helped her Does not recall taking vicodin but she has taken it in the past per PDMP database - she is willing to retry this Vicodin 10-325 mg 1 tab TID prn Start gabapentin 300 mg HS -discussed this can cause drowsiness.  Discussed that we can increase the dose as needed which may be more effective  Neuropathy: Chronic Has nerve pain bilateral feet and legs Has not tolerated duloxetine in the past Has tried both gabapentin and Lyrica in the past and she does not recall but does not think either one of them help Retry gabapentin 300 mg at bedtime-can titrate if needed  Vaginal yeast infection: Subacute Trial of terconazole PV at bedtime x 3 May need to repeat this weekly Will be done with her IV antibiotics via PICC line the end of this month  She is very upset today about her pain and all of her medical problems.  She is frustrated by having to wait so long to get in with pain management  Has multiple concerns regarding her PICC line and other medical problems   Advised f/u with PCP soon and regularly

## 2023-05-07 NOTE — Telephone Encounter (Signed)
 Fax sent to [facility] [phone] requesting physical therapy 3x a week for 6 weeks. Confirmation of receipt.

## 2023-05-08 ENCOUNTER — Ambulatory Visit (INDEPENDENT_AMBULATORY_CARE_PROVIDER_SITE_OTHER): Payer: Medicare Other | Admitting: Mental Health

## 2023-05-08 ENCOUNTER — Encounter (HOSPITAL_COMMUNITY): Payer: Self-pay

## 2023-05-08 ENCOUNTER — Telehealth: Payer: Self-pay | Admitting: Pharmacist

## 2023-05-08 DIAGNOSIS — F332 Major depressive disorder, recurrent severe without psychotic features: Secondary | ICD-10-CM

## 2023-05-08 NOTE — Telephone Encounter (Signed)
 I think we can keep going for now

## 2023-05-08 NOTE — Telephone Encounter (Signed)
 Received faxed [facility] labs from 3/11. See trend of LFT elevation below. No other notable labs. Currently receiving Zosyn through end of March.   3/11 AST 104 - ALT 98 3/3   AST 48   - ALT 47 2/17 AST 36   - ALT 38  1/3   AST 26   - ALT 30  Please advise if any adjustments needed at this time.  Margarite Gouge, PharmD, CPP, BCIDP, AAHIVP Clinical Pharmacist Practitioner Infectious Diseases Clinical Pharmacist [facility]

## 2023-05-08 NOTE — Progress Notes (Signed)
   THERAPIST PROGRESS NOTE  Session Time: 3:03 pm ( 56 minutes)  Participation Level: Active  Behavioral Response: CasualAlertIrritable  Type of Therapy: Individual Therapy  Treatment Goals addressed: STG: "To cope." Reilyn will increase management of moods AEB development of x3 effective coping skills with ability to process thoughts and feelings in balanced manner within the next 90 days.   ProgressTowards Goals: Initial  Interventions: Supportive  Summary: [name] is a 58 y.o. female who presents with dx of major depression recurrent severe.  Presents for session alert and oriented; mood and affect irritable;agitated. In noticeable pain; slow gait with use of walker. Shares ongoing feelings of frustration, " I am not doing well. " Shares for husband to be her caretaker and to continue to experience high degree of pain. Reports managing health at this time is very overwhelming. Shares concern for her father to be sick and lives [place] and inability to help in her current condition. Shares in addition to pain, difficulty with nausea with smells and reduced ability to taste items. Shares frustration with medical system as well as current political climate with concern for possible reduction in resources. Shares for husband to be a support but concerned for her irritability towards him. Shares would like to work on better coping and processing feelings of frustration. Denies safety concerns.    Suicidal/Homicidal: Nowithout intent/plan  Therapist Response: Therapist engaged Sorcha in therapy session. Reviewed intake session and provided safe space to share thoughts and feelings lin regards to current stressors. Provided support and encouragement; validated feelings. Supported in processing frustrations with medication system and behavioral health system. Engaged in treatment planning and explored areas of support. Reviewed session and provided follow up.   Plan: Return again in  x 6  weeks.  Diagnosis: Major depressive disorder, recurrent episode, severe with anxious distress (HCC)  Collaboration of Care: Other None  Patient/Guardian was advised Release of Information must be obtained prior to any record release in order to collaborate their care with an outside provider. Patient/Guardian was advised if they have not already done so to contact the registration department to sign all necessary forms in order for [place] to release information regarding their care.   Consent: Patient/Guardian gives verbal consent for treatment and assignment of benefits for services provided during this visit. Patient/Guardian expressed understanding and agreed to proceed.   Stephan Minister [place], [facility] 05/12/2023

## 2023-05-09 ENCOUNTER — Other Ambulatory Visit (HOSPITAL_COMMUNITY): Payer: Self-pay

## 2023-05-09 ENCOUNTER — Telehealth: Payer: Self-pay | Admitting: Cardiology

## 2023-05-09 DIAGNOSIS — T8149XA Infection following a procedure, other surgical site, initial encounter: Secondary | ICD-10-CM | POA: Diagnosis not present

## 2023-05-09 DIAGNOSIS — B9689 Other specified bacterial agents as the cause of diseases classified elsewhere: Secondary | ICD-10-CM | POA: Diagnosis not present

## 2023-05-09 DIAGNOSIS — F333 Major depressive disorder, recurrent, severe with psychotic symptoms: Secondary | ICD-10-CM | POA: Diagnosis not present

## 2023-05-09 DIAGNOSIS — E1159 Type 2 diabetes mellitus with other circulatory complications: Secondary | ICD-10-CM | POA: Diagnosis not present

## 2023-05-09 DIAGNOSIS — I5042 Chronic combined systolic (congestive) and diastolic (congestive) heart failure: Secondary | ICD-10-CM | POA: Diagnosis not present

## 2023-05-09 DIAGNOSIS — I152 Hypertension secondary to endocrine disorders: Secondary | ICD-10-CM | POA: Diagnosis not present

## 2023-05-09 NOTE — Telephone Encounter (Signed)
 Sounds good thank you

## 2023-05-09 NOTE — Telephone Encounter (Signed)
 Spoke to [employer] with [employer].Stated she was covering for another RN today.She saw patient for the first time to check on her picc line.She noticed patient was not wearing her life vest.She ask patient about wearing it and she stated it was uncomfortable.She advised patient she should wear.She wanted Dr.Hochrein to know.Advised I will send message to him.

## 2023-05-09 NOTE — Telephone Encounter (Signed)
 Angelique Blonder is requesting a callback regarding her doing a visit to pt today and wanted to know who is over her life vest because pt hasn't been wearing it for a while now. She'd like to discuss further once called back. Please advise

## 2023-05-12 DIAGNOSIS — F333 Major depressive disorder, recurrent, severe with psychotic symptoms: Secondary | ICD-10-CM | POA: Diagnosis not present

## 2023-05-12 DIAGNOSIS — E1159 Type 2 diabetes mellitus with other circulatory complications: Secondary | ICD-10-CM | POA: Diagnosis not present

## 2023-05-12 DIAGNOSIS — T148XXA Other injury of unspecified body region, initial encounter: Secondary | ICD-10-CM | POA: Diagnosis not present

## 2023-05-12 DIAGNOSIS — B9689 Other specified bacterial agents as the cause of diseases classified elsewhere: Secondary | ICD-10-CM | POA: Diagnosis not present

## 2023-05-12 DIAGNOSIS — I152 Hypertension secondary to endocrine disorders: Secondary | ICD-10-CM | POA: Diagnosis not present

## 2023-05-12 DIAGNOSIS — L089 Local infection of the skin and subcutaneous tissue, unspecified: Secondary | ICD-10-CM | POA: Diagnosis not present

## 2023-05-12 DIAGNOSIS — I5042 Chronic combined systolic (congestive) and diastolic (congestive) heart failure: Secondary | ICD-10-CM | POA: Diagnosis not present

## 2023-05-12 DIAGNOSIS — T8149XA Infection following a procedure, other surgical site, initial encounter: Secondary | ICD-10-CM | POA: Diagnosis not present

## 2023-05-12 NOTE — Telephone Encounter (Signed)
Spoke to patient Dr.Hochrein's advice given. 

## 2023-05-14 DIAGNOSIS — I152 Hypertension secondary to endocrine disorders: Secondary | ICD-10-CM | POA: Diagnosis not present

## 2023-05-14 DIAGNOSIS — I5042 Chronic combined systolic (congestive) and diastolic (congestive) heart failure: Secondary | ICD-10-CM | POA: Diagnosis not present

## 2023-05-14 DIAGNOSIS — T8149XA Infection following a procedure, other surgical site, initial encounter: Secondary | ICD-10-CM | POA: Diagnosis not present

## 2023-05-14 DIAGNOSIS — B9689 Other specified bacterial agents as the cause of diseases classified elsewhere: Secondary | ICD-10-CM | POA: Diagnosis not present

## 2023-05-14 DIAGNOSIS — E1159 Type 2 diabetes mellitus with other circulatory complications: Secondary | ICD-10-CM | POA: Diagnosis not present

## 2023-05-14 DIAGNOSIS — F333 Major depressive disorder, recurrent, severe with psychotic symptoms: Secondary | ICD-10-CM | POA: Diagnosis not present

## 2023-05-16 ENCOUNTER — Ambulatory Visit: Payer: Self-pay | Admitting: *Deleted

## 2023-05-16 NOTE — Telephone Encounter (Signed)
  Chief Complaint: pain- uncontrolled  Symptoms: multiple complaints- mainly uncontrolled pain at multiple areas of body  Frequency: chronic since October  Disposition: [] ED /[] Urgent Care (no appt availability in office) / [] Appointment(In office/virtual)/ []  [facility]/ [] Home Care/ [] Refused Recommended Disposition /[] [facility] Mobile Bus/ [x]  Follow-up with PCP Additional Notes: Patient is requesting appointment with PCP- she only wants to see him- call to office and   first possible appointment is TH, 3/27 at 3:40. Patient has been scheduled and she is aware. Patient advised seek care if pain becomes unbearable.  Copied from CRM [id number]. Topic: Clinical - Red Word Triage >> May 16, 2023  2:40 PM Martinique E wrote: Reason for Triage: Patient experiencing pain all over her body. Patient has neuropathy and her feet feel ice cold at times. Patient also has lower back pain, neck pain, and leg pain. States the pain level is at a 7, and then during the night it can get up to a 14 (with 10 being extreme). Patient also stated she has an appetite, but food does not taste good at all, she's been drinking protein shakes. Patient is very sensitive to smells which make her nauseous and sick. Patient also noticed a lump in her neck. Also experiencing pressure sores that are on her tail bone and heels. Reason for Disposition  [1] MODERATE pain (e.g., interferes with normal activities) AND [2] present > 3 days  Answer Assessment - Initial Assessment Questions 1. ONSET: "When did the muscle aches or body pains start?"      October 10- chronic pain 2. LOCATION: "What part of your body is hurting?" (e.g., entire body, arms, legs)      Painful-patient does not have pain management- feels system is failing her. Patient states Oxycodone does not help her. 3. SEVERITY: "How bad is the pain?" (Scale 1-10; or mild, moderate, severe)   - MILD (1-3): doesn't interfere with normal activities    -  MODERATE (4-7): interferes with normal activities or awakens from sleep    - SEVERE (8-10):  excruciating pain, unable to do any normal activities      7/10 4. CAUSE: "What do you think is causing the pains?"     Pain from wounds - suffering from prolonged pain  6. OTHER SYMPTOMS: "Do you have any other symptoms?" (e.g., chest pain, weakness, rash, cold or flu symptoms, weight loss)     Shoulder, neck pain, pressure on buttock- not open wound. Patient does have home nurse for wound care. Heel pain. Lower back pain. Right leg pain- uses the leg more and she is suffering. Patient has fatigue. Patient is unable to taste foods. Patient is sensitive to food smells.  Protocols used: Muscle Aches and Body Pain-A-AH

## 2023-05-19 ENCOUNTER — Encounter (HOSPITAL_COMMUNITY): Payer: Self-pay | Admitting: Emergency Medicine

## 2023-05-19 ENCOUNTER — Emergency Department (HOSPITAL_COMMUNITY)

## 2023-05-19 ENCOUNTER — Emergency Department (HOSPITAL_COMMUNITY)
Admission: EM | Admit: 2023-05-19 | Discharge: 2023-05-19 | Discharge status: Against Medical Advice | Attending: Medical | Admitting: Medical

## 2023-05-19 ENCOUNTER — Ambulatory Visit: Payer: Self-pay | Admitting: Licensed Clinical Social Worker

## 2023-05-19 DIAGNOSIS — Y829 Unspecified medical devices associated with adverse incidents: Secondary | ICD-10-CM | POA: Insufficient documentation

## 2023-05-19 DIAGNOSIS — T8149XA Infection following a procedure, other surgical site, initial encounter: Secondary | ICD-10-CM | POA: Diagnosis not present

## 2023-05-19 DIAGNOSIS — I5042 Chronic combined systolic (congestive) and diastolic (congestive) heart failure: Secondary | ICD-10-CM | POA: Diagnosis not present

## 2023-05-19 DIAGNOSIS — T82534A Leakage of infusion catheter, initial encounter: Secondary | ICD-10-CM | POA: Diagnosis not present

## 2023-05-19 DIAGNOSIS — Z5321 Procedure and treatment not carried out due to patient leaving prior to being seen by health care provider: Secondary | ICD-10-CM | POA: Diagnosis not present

## 2023-05-19 DIAGNOSIS — E1159 Type 2 diabetes mellitus with other circulatory complications: Secondary | ICD-10-CM | POA: Diagnosis not present

## 2023-05-19 DIAGNOSIS — I152 Hypertension secondary to endocrine disorders: Secondary | ICD-10-CM | POA: Diagnosis not present

## 2023-05-19 DIAGNOSIS — B9689 Other specified bacterial agents as the cause of diseases classified elsewhere: Secondary | ICD-10-CM | POA: Diagnosis not present

## 2023-05-19 DIAGNOSIS — L089 Local infection of the skin and subcutaneous tissue, unspecified: Secondary | ICD-10-CM | POA: Diagnosis not present

## 2023-05-19 DIAGNOSIS — Z452 Encounter for adjustment and management of vascular access device: Secondary | ICD-10-CM | POA: Diagnosis not present

## 2023-05-19 DIAGNOSIS — F333 Major depressive disorder, recurrent, severe with psychotic symptoms: Secondary | ICD-10-CM | POA: Diagnosis not present

## 2023-05-19 NOTE — Progress Notes (Signed)
 VAST consult for PICC assessment. Arrived to patient room. Noted PICC drsg with moisture under the drsg. Patient reports it took about 4/5 hours for the drsg to appear this way. It was changed today by the homecare nurse. The homecare nurse instructed patient to come to the hospital d/t moisture concerns. This nurse removed the drsg and flushed the PICC. There was no drainage from site with flushing. No redness, or swelling/edema noted.Instructed patient to continue to monitor and report to homecare nurse with any further questions or concerns. Patient will be done with ATB 3/31 per patient. Patient VU. Chest x-ray also reviewed. Discussed POC with patient's RN. Tomasita Morrow, RN VAST

## 2023-05-19 NOTE — ED Triage Notes (Signed)
 Pt in POV with possible leaking PICC line to R upper arm. PICC was placed 12mo ago here at [facility] for reported Pseudomonas infection of [name] from infection after IO placement in October during resuscitation efforts. Pt currently getting IV abx at home, states her home health nurse came out today for weekly dsg changes and labwork. Pt states she is concerned that the PICC is leaking inside the tegaderm of dressing.

## 2023-05-19 NOTE — ED Notes (Signed)
 Pt roomed to triage for IV team, who is present doing an assessment on PICC line at this time

## 2023-05-19 NOTE — Patient Instructions (Signed)
 Visit Information  Thank you for taking time to visit with me today. Please don't hesitate to contact me if I can be of assistance to you.   Following are the goals we discussed today:   Goals Addressed             This Visit's Progress    Patient sees counselor for mental health support       Interventions:  Spoke with client via phone today about client needs Client spoke of pain issues faced. Client said she has been trying to get appointment at [facility] since last Fall (2024). Client has ongoing pain issues and is concerned about ongoing pain issues. Client spoke of leg pain and foot pain. Client said she sees counselor for mental health support. Client cancelled her appointment for tomorrow with psychiatrist. (She said medication prescribed is contra indicated with her current antibiotic used)  She  also has a Veterinary surgeon she sees at [facility] . She had appointment with Counselor a few weeks ago She has medical needs and is having wound care in her home 1 time weekly Talked with client about program services with RN, LCSW, Pharmacist Client spoke of issues with her smell. She said she can smell foods when they are cooked and sometimes smells can make her feel nauseous Talked about client support from her spouse. Her spouse is very supportive.  Client sees cardiologist as scheduled. Discussed sleeping issues of client. She has difficulty sleeping Client spoke of neuropathy . She spoke of pain in her feet and pain in her legs She feels as if her feet are cold. Feet feel numb all day. Feet feel as if they are burning sometimes. Client said she has shooting nerve pain on her left leg.  Client has been receiving support from Engineer, petroleum.  She has had 2 skin surgeries on her left leg.   Client spoke of support from doctor with Infectious Diseases Client said she has had appointments with counselor 2 times .  Client has visit with [facility] Nurse 1 time weekly Thanked  client for phone call with LCSW today Encouraged client to call LCSW as needed for SW support. Client has phone number for LCSW [phone]) Client was very appreciative of call from LCSW today          Our next appointment is by telephone on 06/17/23 at 2:30 PM   Please call the care guide team at [phone] if you need to cancel or reschedule your appointment.   If you are experiencing a Mental Health or Behavioral Health Crisis or need someone to talk to, please go to [facility] [address] [place] [phone])   The patient verbalized understanding of instructions, educational materials, and care plan provided today and DECLINED offer to receive copy of patient instructions, educational materials, and care plan.   The patient has been provided with contact information for the care management team and has been advised to call with any health related questions or concerns.    Lorna Few  MSW, LCSW [facility] Licensed Clinical Social Worker Direct Dial:  [phone] Fax:  [phone] Website:  [email or web address]

## 2023-05-19 NOTE — ED Notes (Signed)
 PICC nurse has come to assess pt's IV and CXR has confirmed placement. Pt declines to wait and see EDP, wishes to leave at this time

## 2023-05-19 NOTE — Patient Outreach (Signed)
 Care Coordination   Follow Up Visit Note   05/19/2023 Name: [name] MRN: [id number] DOB: 1965-07-11  [name] is a 58 y.o. year old female who sees [name], MD for primary care. I spoke with  [name] by phone today.  What matters to the patients health and wellness today?   Patient has mental health challenges.    Goals Addressed             This Visit's Progress    Patient sees counselor for mental health support       Interventions:  Spoke with client via phone today about client needs Client spoke of pain issues faced. Client said she has been trying to get appointment at [facility] since last Fall (2024). Client has ongoing pain issues and is concerned about ongoing pain issues. Client spoke of leg pain and foot pain. Client said she sees counselor for mental health support. Client cancelled her appointment for tomorrow with psychiatrist. (She said medication prescribed is contra indicated with her current antibiotic used)  She  also has a Veterinary surgeon she sees at [facility] . She had appointment with Counselor a few weeks ago She has medical needs and is having wound care in her home 1 time weekly Talked with client about program services with RN, LCSW, Pharmacist Client spoke of issues with her smell. She said she can smell foods when they are cooked and sometimes smells can make her feel nauseous Talked about client support from her spouse. Her spouse is very supportive.  Client sees cardiologist as scheduled. Discussed sleeping issues of client. She has difficulty sleeping Client spoke of neuropathy . She spoke of pain in her feet and pain in her legs She feels as if her feet are cold. Feet feel numb all day. Feet feel as if they are burning sometimes. Client said she has shooting nerve pain on her left leg.  Client has been receiving support from Engineer, [employer].  She has had 2 skin surgeries on her left leg.   Client spoke of support from  doctor with Infectious Diseases Client said she has had appointments with counselor 2 times .  Client has visit with [facility] Nurse 1 time weekly Thanked client for phone call with LCSW today Encouraged client to call LCSW as needed for SW support. Client has phone number for LCSW [phone]) Client was very appreciative of call from LCSW today          SDOH assessments and interventions completed:  Yes  SDOH Interventions Today    Flowsheet Row Most Recent Value  SDOH Interventions   Depression Interventions/Treatment  Medication  Physical Activity Interventions Other (Comments)  [mobility issues]  Stress Interventions Other (Comment)  [Stress in [place] medical issues,  stress in managing pain issues]        Care Coordination Interventions:  Yes, provided   Interventions Today    Flowsheet Row Most Recent Value  Chronic Disease   Chronic disease during today's visit Other  [spoke with client about client needs]  General Interventions   General Interventions Discussed/Reviewed General Interventions Discussed, Community Resources  Education Interventions   Education Provided Provided Education  Provided Verbal Education On [employer]  Mental Health Interventions   Mental Health Discussed/Reviewed Coping Strategies  [has stress in managing medical needs]  Nutrition Interventions   Nutrition Discussed/Reviewed Nutrition Discussed  Pharmacy Interventions   Pharmacy Dicussed/Reviewed Pharmacy Topics Discussed  Safety Interventions   Safety Discussed/Reviewed Fall Risk  Follow up plan: Follow up call scheduled for 06/17/23 at 2:30 PM     Encounter Outcome:  Patient Visit Completed    Lorna Few  MSW, LCSW [facility] Licensed Clinical Social Worker Direct Dial:  [phone] Fax:  [phone] Website:  [email or web address]

## 2023-05-20 ENCOUNTER — Encounter (HOSPITAL_COMMUNITY): Payer: Self-pay

## 2023-05-20 ENCOUNTER — Encounter (HOSPITAL_COMMUNITY): Admitting: Physician Assistant

## 2023-05-20 ENCOUNTER — Emergency Department (HOSPITAL_COMMUNITY)
Admission: EM | Admit: 2023-05-20 | Discharge: 2023-05-21 | Disposition: A | Discharge status: Home / Self Care | Attending: Emergency Medicine | Admitting: Emergency Medicine

## 2023-05-20 ENCOUNTER — Encounter: Payer: Medicare Other | Admitting: Student

## 2023-05-20 ENCOUNTER — Other Ambulatory Visit: Payer: Self-pay

## 2023-05-20 DIAGNOSIS — Z79899 Other long term (current) drug therapy: Secondary | ICD-10-CM | POA: Diagnosis not present

## 2023-05-20 DIAGNOSIS — Z95828 Presence of other vascular implants and grafts: Secondary | ICD-10-CM

## 2023-05-20 DIAGNOSIS — E119 Type 2 diabetes mellitus without complications: Secondary | ICD-10-CM | POA: Insufficient documentation

## 2023-05-20 DIAGNOSIS — Y848 Other medical procedures as the cause of abnormal reaction of the patient, or of later complication, without mention of misadventure at the time of the procedure: Secondary | ICD-10-CM | POA: Diagnosis not present

## 2023-05-20 DIAGNOSIS — Z452 Encounter for adjustment and management of vascular access device: Secondary | ICD-10-CM | POA: Diagnosis not present

## 2023-05-20 DIAGNOSIS — Z959 Presence of cardiac and vascular implant and graft, unspecified: Secondary | ICD-10-CM | POA: Insufficient documentation

## 2023-05-20 DIAGNOSIS — I10 Essential (primary) hypertension: Secondary | ICD-10-CM | POA: Insufficient documentation

## 2023-05-20 DIAGNOSIS — Z7984 Long term (current) use of oral hypoglycemic drugs: Secondary | ICD-10-CM | POA: Insufficient documentation

## 2023-05-20 DIAGNOSIS — T82534A Leakage of infusion catheter, initial encounter: Secondary | ICD-10-CM | POA: Diagnosis not present

## 2023-05-20 DIAGNOSIS — M869 Osteomyelitis, unspecified: Secondary | ICD-10-CM | POA: Diagnosis not present

## 2023-05-20 DIAGNOSIS — Z87891 Personal history of nicotine dependence: Secondary | ICD-10-CM | POA: Insufficient documentation

## 2023-05-20 DIAGNOSIS — T82898A Other specified complication of vascular prosthetic devices, implants and grafts, initial encounter: Secondary | ICD-10-CM | POA: Diagnosis not present

## 2023-05-20 NOTE — ED Triage Notes (Signed)
 Pt is coming in for a PICC line problem that she was seen for yesterday, she came in for it yesterday due to it leaking and wanted it to be removed because it is supposed to be removed on the 31st of march. She had and X-ray done for placement but the PICC has continued to leak from the site of insertion and she would like to have it removed. It was assessed by the IV team yesterday in which they directed her to comeback in if the issue persisted.

## 2023-05-20 NOTE — ED Provider Notes (Signed)
 [place] EMERGENCY DEPARTMENT AT [facility] Provider Note   CSN: [id number] Arrival date & time: 05/20/23  2044     History {Add pertinent medical, surgical, social history, OB history to HPI:1} Chief Complaint  Patient presents with   Vascular Access Problem    Emilee Market is a 58 y.o. female.  HPI     This is a 58 year old female who presents with concern for PICC line malfunction.  Was seen yesterday by the IV team but left prior to provider evaluation.  Per notes, IV team flushed the PICC line and got a chest x-ray and all was reassuring.  Patient states today she has had ongoing drainage from the site.  She has continuous infusion of antibiotics through that PICC line for infection at the site of a prior IO placement.  Questionable osteomyelitis.  She is due to have antibiotics IV through March 31 with transition to likely a penicillin and ciprofloxacin.  Home Medications Prior to Admission medications   Medication Sig Start Date End Date Taking? Authorizing Provider  Accu-Chek Softclix Lancets lancets Use as instructed 04/02/23   [name], MD  acetaminophen (TYLENOL) 325 MG tablet Take 1-2 tablets (325-650 mg total) by mouth every 4 (four) hours as needed for mild pain (pain score 1-3). 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 04/30/23   Randall Hiss, MD  atorvastatin (LIPITOR) 80 MG tablet TAKE 1 TABLET BY MOUTH EVERY DAY 04/29/23   [name], MD  Blood Glucose Monitoring Suppl DEVI 1 each by Does not apply route in the morning, at noon, and at bedtime. May substitute to any manufacturer covered by patient's insurance. 01/30/23   Elenore Paddy, NP  ciprofloxacin (CIPRO) 750 MG tablet Take 1 tablet (750 mg total) by mouth  2 (two) times daily. 04/30/23   Randall Hiss, MD  collagenase [facility]) 250 UNIT/GM ointment Apply 1 Application topically daily. 01/22/23   [name], PA-C  dapagliflozin propanediol (FARXIGA) 10 MG TABS tablet Take 1 tablet (10 mg total) by mouth daily. 01/30/23   [name], MD  fluticasone (FLONASE) 50 MCG/ACT nasal spray SPRAY 2 SPRAYS INTO EACH NOSTRIL EVERY DAY 04/29/23   [name], MD  furosemide (LASIX) 40 MG tablet TAKE 2 TAB TWICE DAILY FOR 3 DAYS THEN 2 TAB DAILY. 05/02/23   Runell Gess, MD  gabapentin (NEURONTIN) 300 MG capsule Take 1 capsule (300 mg total) by mouth at bedtime. 05/07/23   Pincus Sanes, MD  gentian violet (GNP GENTIAN VIOLET) 1 % topical solution Apply topically to affected area every 2-3 days as needed. May wash off in between applications. 03/26/23   Moshe Cipro, FNP  Glucose Blood (BLOOD GLUCOSE TEST STRIPS) STRP 1 each by In Vitro route in the morning, at noon, and at bedtime. May substitute to any manufacturer covered by patient's insurance. 11/15/22 12/20/23  Elenore Paddy, NP  hydrOXYzine (ATARAX) 25 MG tablet Take 1 tablet (25 mg total) by mouth 3 (three) times daily as needed. 03/26/23   Moshe Cipro, FNP  ketoconazole (NIZORAL) 2 % cream Apply 1 Application topically daily. Apply 1gm to skin on plantar aspect of affected foot, and massage in well.  Apply once daily. 02/03/23  McCaughan, Dia D, DPM  LORazepam (ATIVAN) 0.5 MG tablet Take 1 tablet (0.5 mg total) by mouth 2 (two) times daily as needed for anxiety. 03/11/23   Nwoko, Tommas Olp, PA  losartan (COZAAR) 25 MG tablet Take 0.5 tablets (12.5 mg total) by mouth daily. 01/30/23   [name], MD  magnesium oxide (MAG-OX) 400 MG tablet Take 1 tablet (400 mg total) by mouth at bedtime. 01/30/23   [name], MD  metFORMIN (GLUCOPHAGE-XR) 500 MG 24 hr tablet Take 1 tablet (500 mg total) by mouth 2 (two) times daily with a meal. 01/29/23   [name], MD  metoprolol  succinate (TOPROL-XL) 50 MG 24 hr tablet Take 1 tablet (50 mg total) by mouth daily. Take with or immediately following a meal. 01/30/23   [name], MD  NON FORMULARY Pt uses cpap nightly    [provider]  ondansetron (ZOFRAN-ODT) 4 MG disintegrating tablet Take 1 tablet (4 mg total) by mouth every 8 (eight) hours as needed for nausea or vomiting. 03/13/23   Vu, Gershon Mussel T, MD  piperacillin-tazobactam (ZOSYN) 4.5 (4-0.5) g injection Inject 10 g into the vein over 24 hr. 04/07/23   [provider]  polyethylene glycol powder (GLYCOLAX/MIRALAX) 17 GM/SCOOP powder Take 1 capful (17 g) in water 2 (two) times daily. 03/25/23   Moshe Cipro, FNP  potassium chloride SA (KLOR-CON M) 20 MEQ tablet Take 1 tablet (20 mEq total) by mouth daily. 01/30/23   [name], MD  senna-docusate (SENOKOT-S) 8.6-50 MG tablet Take 2 tablets by mouth at bedtime. 04/02/23   [name], MD  spironolactone (ALDACTONE) 25 MG tablet Take 0.5 tablets (12.5 mg total) by mouth daily. 01/30/23   [name], MD  terconazole (TERAZOL 3) 80 MG vaginal suppository Place 1 suppository (80 mg total) vaginally at bedtime. 05/07/23   Pincus Sanes, MD  vortioxetine HBr (TRINTELLIX) 5 MG TABS tablet Take 1 tablet (5 mg total) by mouth daily. 03/25/23   Moshe Cipro, FNP      Allergies    Fluoxetine, Cymbalta [duloxetine hcl], and Pork-derived products    Review of Systems   Review of Systems  Constitutional:  Negative for fever.  Skin:  Negative for color change.  All other systems reviewed and are negative.   Physical Exam Updated Vital Signs BP 136/67 (BP Location: Left Wrist)   Pulse (!) 111   Temp 98.2 F (36.8 C)   Resp 18   LMP  (LMP Unknown)   SpO2 98%  Physical Exam Vitals and nursing note reviewed.  Constitutional:      Appearance: She is well-developed. She is obese. She is not ill-appearing.  HENT:     Head: Normocephalic and atraumatic.  Eyes:     Pupils: Pupils are  equal, round, and reactive to light.  Cardiovascular:     Rate and Rhythm: Normal rate and regular rhythm.  Pulmonary:     Effort: Pulmonary effort is normal. No respiratory distress.     Breath sounds: No wheezing.  Abdominal:     Palpations: Abdomen is soft.     Tenderness: There is no abdominal tenderness. There is no guarding or rebound.  Musculoskeletal:     Cervical back: Neck supple.  Skin:    General: Skin is warm and dry.     Comments: Skin at PICC line site clean dry and intact, no adjacent erythema Patient would not allow me to take down the dressing from the infection site on her left lower  extremity  Neurological:     Mental Status: She is alert and oriented to person, place, and time.  Psychiatric:        Mood and Affect: Mood normal.     ED Results / Procedures / Treatments   Labs (all labs ordered are listed, but only abnormal results are displayed) Labs Reviewed - No data to display  EKG None  Radiology DG Chest 2 View Result Date: 05/19/2023 CLINICAL DATA:  PICC line EXAM: CHEST - 2 VIEW COMPARISON:  Chest x-ray 12/10/2022 FINDINGS: The right upper extremity PICC tip ends in the SVC. There is no pneumothorax. The lungs are clear. There is no pleural effusion. The cardiomediastinal silhouette is within normal limits. Osseous structures are within normal limits. IMPRESSION: Right upper extremity PICC tip ends in the SVC. Electronically Signed   By: Darliss Cheney M.D.   On: 05/19/2023 22:05    Procedures Procedures  {Document cardiac monitor, telemetry assessment procedure when appropriate:1}  Medications Ordered in ED Medications - No data to display  ED Course/ Medical Decision Making/ A&P   {   Click here for ABCD2, HEART and other calculatorsREFRESH Note before signing :1}                              Medical Decision Making  ***  {Document critical care time when appropriate:1} {Document review of labs and clinical decision tools ie heart score,  Chads2Vasc2 etc:1}  {Document your independent review of radiology images, and any outside records:1} {Document your discussion with family members, caretakers, and with consultants:1} {Document social determinants of health affecting pt's care:1} {Document your decision making why or why not admission, treatments were needed:1} Final Clinical Impression(s) / ED Diagnoses Final diagnoses:  None    Rx / DC Orders ED Discharge Orders     None

## 2023-05-21 ENCOUNTER — Encounter: Payer: Self-pay | Admitting: Plastic Surgery

## 2023-05-21 ENCOUNTER — Encounter: Admitting: Plastic Surgery

## 2023-05-21 ENCOUNTER — Telehealth: Payer: Self-pay

## 2023-05-21 ENCOUNTER — Other Ambulatory Visit: Payer: Self-pay

## 2023-05-21 ENCOUNTER — Emergency Department (HOSPITAL_COMMUNITY)
Admission: EM | Admit: 2023-05-21 | Discharge: 2023-05-21 | Disposition: A | Discharge status: Home / Self Care | Attending: Emergency Medicine | Admitting: Emergency Medicine

## 2023-05-21 ENCOUNTER — Encounter (HOSPITAL_COMMUNITY): Payer: Self-pay | Admitting: Emergency Medicine

## 2023-05-21 ENCOUNTER — Telehealth: Payer: Self-pay | Admitting: Plastic Surgery

## 2023-05-21 DIAGNOSIS — M869 Osteomyelitis, unspecified: Secondary | ICD-10-CM | POA: Insufficient documentation

## 2023-05-21 DIAGNOSIS — I10 Essential (primary) hypertension: Secondary | ICD-10-CM | POA: Insufficient documentation

## 2023-05-21 DIAGNOSIS — Z79899 Other long term (current) drug therapy: Secondary | ICD-10-CM | POA: Insufficient documentation

## 2023-05-21 DIAGNOSIS — E119 Type 2 diabetes mellitus without complications: Secondary | ICD-10-CM | POA: Insufficient documentation

## 2023-05-21 DIAGNOSIS — T82534A Leakage of infusion catheter, initial encounter: Secondary | ICD-10-CM | POA: Insufficient documentation

## 2023-05-21 DIAGNOSIS — T829XXA Unspecified complication of cardiac and vascular prosthetic device, implant and graft, initial encounter: Secondary | ICD-10-CM

## 2023-05-21 DIAGNOSIS — T82898A Other specified complication of vascular prosthetic devices, implants and grafts, initial encounter: Secondary | ICD-10-CM | POA: Diagnosis not present

## 2023-05-21 DIAGNOSIS — Z7984 Long term (current) use of oral hypoglycemic drugs: Secondary | ICD-10-CM | POA: Insufficient documentation

## 2023-05-21 DIAGNOSIS — Z452 Encounter for adjustment and management of vascular access device: Secondary | ICD-10-CM | POA: Diagnosis not present

## 2023-05-21 MED ORDER — HYDROCODONE-ACETAMINOPHEN 5-325 MG PO TABS
2.0000 | ORAL_TABLET | Freq: Once | ORAL | Status: AC
  Administered 2023-05-21: 2 via ORAL
  Filled 2023-05-21: qty 2

## 2023-05-21 MED ORDER — OXYCODONE-ACETAMINOPHEN 5-325 MG PO TABS: 2.0000 | ORAL_TABLET | Freq: Once | ORAL | Status: DC

## 2023-05-21 NOTE — Discharge Instructions (Signed)
 You were seen today for concerns regarding a PICC line.  This was removed and a midline IV was placed on the left.  Continue your infusions as instructed by infectious disease.

## 2023-05-21 NOTE — Discharge Instructions (Addendum)
 I believe that the PICC line should work well.  However, if you are having any problems with it, please do not hesitate to return to the emergency department.

## 2023-05-21 NOTE — ED Triage Notes (Signed)
 Pt in with reported leaking midline that was placed earlier tonight. Pt had R PICC that was removed last night, then access replaced with the midline. Arrives with abx infusing, dressing appears wet.

## 2023-05-21 NOTE — ED Notes (Signed)
 Biopatch and dressing change completed.

## 2023-05-21 NOTE — Telephone Encounter (Signed)
error 

## 2023-05-21 NOTE — ED Provider Notes (Signed)
 This [place] EMERGENCY DEPARTMENT AT [facility] Provider Note   CSN: [id number] Arrival date & time: 05/21/23  4098     History  Chief Complaint  Patient presents with   Midline Problem    [name] is a 58 y.o. female.  The history is provided by the patient.  She has history of hypertension, diabetes, hyperlipidemia and currently getting IV antibiotics for osteomyelitis of her left lower leg and comes in because of leaking from her PICC line.  She had problems with leaking from her PICC line earlier tonight and come in and had a new line placed in her left arm.  That line is now leaking also.   Home Medications Prior to Admission medications   Medication Sig Start Date End Date Taking? Authorizing Provider  Accu-Chek Softclix Lancets lancets Use as instructed 04/02/23   [name], MD  acetaminophen (TYLENOL) 325 MG tablet Take 1-2 tablets (325-650 mg total) by mouth every 4 (four) hours as needed for mild pain (pain score 1-3). 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 04/30/23   Randall Hiss, MD  atorvastatin (LIPITOR) 80 MG tablet TAKE 1 TABLET BY MOUTH EVERY DAY 04/29/23   [name], MD  Blood Glucose Monitoring Suppl DEVI 1 each by Does not apply route in the morning, at noon, and at bedtime. May substitute to any manufacturer covered by patient's insurance. 01/30/23   Elenore Paddy, NP  ciprofloxacin (CIPRO) 750 MG tablet Take 1 tablet (750 mg total) by mouth 2 (two) times daily. 04/30/23   Randall Hiss, MD  collagenase [facility]) 250 UNIT/GM ointment Apply 1 Application topically daily. 01/22/23   [name], PA-C  dapagliflozin propanediol (FARXIGA) 10 MG TABS tablet Take 1 tablet (10 mg total) by mouth  daily. 01/30/23   [name], MD  fluticasone (FLONASE) 50 MCG/ACT nasal spray SPRAY 2 SPRAYS INTO EACH NOSTRIL EVERY DAY 04/29/23   [name], MD  furosemide (LASIX) 40 MG tablet TAKE 2 TAB TWICE DAILY FOR 3 DAYS THEN 2 TAB DAILY. 05/02/23   Runell Gess, MD  gabapentin (NEURONTIN) 300 MG capsule Take 1 capsule (300 mg total) by mouth at bedtime. 05/07/23   Pincus Sanes, MD  gentian violet (GNP GENTIAN VIOLET) 1 % topical solution Apply topically to affected area every 2-3 days as needed. May wash off in between applications. 03/26/23   Moshe Cipro, FNP  Glucose Blood (BLOOD GLUCOSE TEST STRIPS) STRP 1 each by In Vitro route in the morning, at noon, and at bedtime. May substitute to any manufacturer covered by patient's insurance. 11/15/22 12/20/23  Elenore Paddy, NP  hydrOXYzine (ATARAX) 25 MG tablet Take 1 tablet (25 mg total) by mouth 3 (three) times daily as needed. 03/26/23   Moshe Cipro, FNP  ketoconazole (NIZORAL) 2 % cream Apply 1 Application topically daily. Apply 1gm to skin on plantar aspect of affected foot, and massage in well.  Apply once daily. 02/03/23   McCaughan, Dia D, DPM  LORazepam (ATIVAN) 0.5 MG tablet Take 1 tablet (0.5 mg total) by mouth 2 (two) times daily as needed for anxiety. 03/11/23   Nwoko, Tommas Olp, PA  losartan (COZAAR) 25 MG tablet Take 0.5 tablets (12.5 mg  total) by mouth daily. 01/30/23   [name], MD  magnesium oxide (MAG-OX) 400 MG tablet Take 1 tablet (400 mg total) by mouth at bedtime. 01/30/23   [name], MD  metFORMIN (GLUCOPHAGE-XR) 500 MG 24 hr tablet Take 1 tablet (500 mg total) by mouth 2 (two) times daily with a meal. 01/29/23   [name], MD  metoprolol succinate (TOPROL-XL) 50 MG 24 hr tablet Take 1 tablet (50 mg total) by mouth daily. Take with or immediately following a meal. 01/30/23   [name], MD  NON FORMULARY Pt uses cpap nightly    [provider]  ondansetron (ZOFRAN-ODT) 4 MG  disintegrating tablet Take 1 tablet (4 mg total) by mouth every 8 (eight) hours as needed for nausea or vomiting. 03/13/23   Vu, Gershon Mussel T, MD  piperacillin-tazobactam (ZOSYN) 4.5 (4-0.5) g injection Inject 10 g into the vein over 24 hr. 04/07/23   [provider]  polyethylene glycol powder (GLYCOLAX/MIRALAX) 17 GM/SCOOP powder Take 1 capful (17 g) in water 2 (two) times daily. 03/25/23   Moshe Cipro, FNP  potassium chloride SA (KLOR-CON M) 20 MEQ tablet Take 1 tablet (20 mEq total) by mouth daily. 01/30/23   [name], MD  senna-docusate (SENOKOT-S) 8.6-50 MG tablet Take 2 tablets by mouth at bedtime. 04/02/23   [name], MD  spironolactone (ALDACTONE) 25 MG tablet Take 0.5 tablets (12.5 mg total) by mouth daily. 01/30/23   [name], MD  terconazole (TERAZOL 3) 80 MG vaginal suppository Place 1 suppository (80 mg total) vaginally at bedtime. 05/07/23   Pincus Sanes, MD  vortioxetine HBr (TRINTELLIX) 5 MG TABS tablet Take 1 tablet (5 mg total) by mouth daily. 03/25/23   Moshe Cipro, FNP      Allergies    Fluoxetine, Cymbalta [duloxetine hcl], and Pork-derived products    Review of Systems   Review of Systems  All other systems reviewed and are negative.   Physical Exam Updated Vital Signs BP (!) 148/93 (BP Location: Left Wrist)   Pulse (!) 105   Temp 98 F (36.7 C)   Resp 16   Wt 111.6 kg   LMP  (LMP Unknown)   SpO2 93%   BMI 42.23 kg/m  Physical Exam Vitals and nursing note reviewed.   58 year old female, resting comfortably and in no acute distress. Vital signs are significant for elevated blood pressure and borderline elevated heart rate. Oxygen saturation is 93%, which is normal. Head is normocephalic and atraumatic. PERRLA, EOMI.  Lungs are clear without rales, wheezes, or rhonchi. Chest is nontender. Heart has regular rate and rhythm  Extremities: Dressing over wound on the left lower leg was not removed, but her husband showed me a  picture of the wound still having active purulent drainage.  PICC line present in the left upper arm with some leakage present.  Leakage is clear indicating it is probably her IV fluid..  ED Results / Procedures / Treatments   Labs (all labs ordered are listed, but only abnormal results are displayed) Labs Reviewed - No data to display  EKG None  Radiology DG Chest 2 View Result Date: 05/19/2023 CLINICAL DATA:  PICC line EXAM: CHEST - 2 VIEW COMPARISON:  Chest x-ray 12/10/2022 FINDINGS: The right upper extremity PICC tip ends in the SVC. There is no pneumothorax. The lungs are clear. There is no pleural effusion. The cardiomediastinal silhouette is within normal limits. Osseous structures are within normal limits. IMPRESSION: Right upper extremity PICC  tip ends in the SVC. Electronically Signed   By: Darliss Cheney M.D.   On: 05/19/2023 22:05    Procedures Procedures    Medications Ordered in ED Medications - No data to display  ED Course/ Medical Decision Making/ A&P                                 Medical Decision Making  Leaking PICC line left upper arm.  I have loosened the dressing and noted that I was able to tighten the connection by about half turn.  I am watching that now to see if there is any ongoing leakage.  I have discussed with patient the option of switching to oral antibiotics 5 days before she was scheduled to make the change.  I observed the patient for 2 hours after tightening the of the PICC line.  There was no further leakage seen.  I am discharging her with instructions to continue her outpatient IV antibiotic regimen, and follow-up with infectious disease clinic as scheduled.  Advised to return if she has any further problems with her PICC line.  Final Clinical Impression(s) / ED Diagnoses Final diagnoses:  Complication associated with peripherally inserted central catheter (PICC), initial encounter    Rx / DC Orders ED Discharge Orders     None          Dione Booze, MD 05/21/23 [phone]

## 2023-05-22 ENCOUNTER — Telehealth: Payer: Self-pay

## 2023-05-22 ENCOUNTER — Telehealth: Payer: Self-pay | Admitting: Pharmacist

## 2023-05-22 ENCOUNTER — Encounter: Payer: Self-pay | Admitting: Internal Medicine

## 2023-05-22 ENCOUNTER — Ambulatory Visit (INDEPENDENT_AMBULATORY_CARE_PROVIDER_SITE_OTHER): Admitting: Internal Medicine

## 2023-05-22 VITALS — BP 123/81 | HR 90 | Temp 98.0°F | Resp 16 | Ht 64.0 in | Wt 249.6 lb

## 2023-05-22 DIAGNOSIS — E1169 Type 2 diabetes mellitus with other specified complication: Secondary | ICD-10-CM

## 2023-05-22 DIAGNOSIS — G8929 Other chronic pain: Secondary | ICD-10-CM | POA: Diagnosis not present

## 2023-05-22 DIAGNOSIS — M545 Low back pain, unspecified: Secondary | ICD-10-CM

## 2023-05-22 DIAGNOSIS — M47816 Spondylosis without myelopathy or radiculopathy, lumbar region: Secondary | ICD-10-CM

## 2023-05-22 DIAGNOSIS — Z515 Encounter for palliative care: Secondary | ICD-10-CM | POA: Diagnosis not present

## 2023-05-22 DIAGNOSIS — M51362 Other intervertebral disc degeneration, lumbar region with discogenic back pain and lower extremity pain: Secondary | ICD-10-CM | POA: Diagnosis not present

## 2023-05-22 DIAGNOSIS — M792 Neuralgia and neuritis, unspecified: Secondary | ICD-10-CM

## 2023-05-22 MED ORDER — MORPHINE SULFATE ER 15 MG PO TBCR
15.0000 mg | EXTENDED_RELEASE_TABLET | Freq: Two times a day (BID) | ORAL | 0 refills | Status: DC
Start: 2023-05-22 — End: 2023-07-31

## 2023-05-22 MED ORDER — XTAMPZA ER 13.5 MG PO C12A: 1.0000 | EXTENDED_RELEASE_CAPSULE | Freq: Two times a day (BID) | ORAL | 0 refills | Status: DC | PRN

## 2023-05-22 NOTE — Telephone Encounter (Signed)
 Called [place] and let her know no changes. She says HH is unable to remove a midline in the home. Will need to arrange for patient to come into clinic for removal.   Sandie Ano, RN

## 2023-05-22 NOTE — Telephone Encounter (Signed)
 See previous trend - latest LFTs elevated on 3/17 as well: AST 180 and ALT 129. FYI - please advise if concerns. Last dose scheduled for 3/31.  Margarite Gouge, PharmD, CPP, BCIDP, AAHIVP Clinical Pharmacist Practitioner Infectious Diseases Clinical Pharmacist [facility]

## 2023-05-22 NOTE — Telephone Encounter (Signed)
 [name]  h

## 2023-05-22 NOTE — Telephone Encounter (Signed)
 Received voicemail from [place] with [facility] stating that patient's PICC line was leaking and has now been replaced with a midline. She went to the ED yesterday because the midline began leaking.   Reviewed ED note, provider tightened connection and did not observe any further leaking after that. Patient was discharged home to continue IV antibiotics as planned.   Cala Bradford would like to know if any changes need to be made or if she can continue with continuous pip-tazo until 3/31. Will route to provider.   Cala Bradford [phone]  Sandie Ano, RN

## 2023-05-22 NOTE — Progress Notes (Signed)
 Subjective:  Patient ID: [name], female    DOB: 03-Mar-1965  Age: 58 y.o. MRN: [id number]  CC: Diabetes, Back Pain, and Osteoarthritis   HPI [name] presents for f/up ----  Discussed the use of AI scribe software for clinical note transcription with the patient, who gave verbal consent to proceed.  History of Present Illness   [name] is a 58 year old female who presents with pain management issues related to neuropathy and leg pain.  She experiences significant neuropathic pain primarily in her feet, described as ice cold and numb, with shooting pains down her leg. The symptoms are more pronounced at night, requiring her husband's assistance to alleviate discomfort until she falls asleep. She is currently taking gabapentin for neuropathy, which she has used in the past without success, but is trying again due to desperation.  She has been prescribed an unspecified medication for pain, which she believes is less potent than oxycodone, and she is not keen on taking oxycodone. She has previously used morphine in the hospital, which was effective, but it was administered via injection. She is open to trying oral morphine if it is deemed appropriate.  She reports severe lower back pain, which is exacerbated by the need to lie on her stomach for certain procedures, making it difficult to comply with recommended imaging studies. The pain is described as debilitating, affecting her ability to perform daily activities, including using the bathroom.  She is experiencing gastrointestinal issues, including constipation despite taking Colace gummies and Miralax. She also reports a significant alteration in taste, which she attributes to her medication, making most foods taste unpleasant except for lemons and pretzel rods. This has impacted her ability to enjoy food, although she continues to consume protein shakes for nutritional needs.  She describes a history of a leaking midline  catheter, which required multiple emergency room visits for dressing changes and repositioning. Despite these interventions, the catheter continues to leak, causing frustration and distress.  She expresses significant frustration with the healthcare system, particularly regarding delays in pain management appointments and the impact of her symptoms on her quality of life. She feels weak, unable to perform simple tasks like opening a protein shake, and is concerned about her physical strength and mobility.  No current breathing difficulties, but she experiences them when anxious. She reports urinary symptoms suggestive of a urinary tract infection.       Outpatient Medications Prior to Visit  Medication Sig Dispense Refill   Accu-Chek Softclix Lancets lancets Use as instructed 100 each 12   acetaminophen (TYLENOL) 325 MG tablet Take 1-2 tablets (325-650 mg total) by mouth every 4 (four) hours as needed for mild pain (pain score 1-3). 100 tablet 0   AMBULATORY NON FORMULARY MEDICATION Off-loading wheel chair seat cusion Dispense 1 Dx code: M53.3 Use as needed 1 Units 0   amoxicillin-clavulanate (AUGMENTIN) 875-125 MG tablet Take 1 tablet by mouth 2 (two) times daily. 60 tablet 11   atorvastatin (LIPITOR) 80 MG tablet TAKE 1 TABLET BY MOUTH EVERY DAY 90 tablet 0   Blood Glucose Monitoring Suppl DEVI 1 each by Does not apply route in the morning, at noon, and at bedtime. May substitute to any manufacturer covered by patient's insurance. 1 each 0   ciprofloxacin (CIPRO) 750 MG tablet Take 1 tablet (750 mg total) by mouth 2 (two) times daily. 60 tablet 2   collagenase (SANTYL) 250 UNIT/GM ointment Apply 1 Application topically daily. 15 g 0   dapagliflozin propanediol (  FARXIGA) 10 MG TABS tablet Take 1 tablet (10 mg total) by mouth daily. 90 tablet 1   fluticasone (FLONASE) 50 MCG/ACT nasal spray SPRAY 2 SPRAYS INTO EACH NOSTRIL EVERY DAY 48 mL 0   furosemide (LASIX) 40 MG tablet TAKE 2 TAB TWICE  DAILY FOR 3 DAYS THEN 2 TAB DAILY. 180 tablet 3   gabapentin (NEURONTIN) 300 MG capsule Take 1 capsule (300 mg total) by mouth at bedtime. 30 capsule 1   gentian violet (GNP GENTIAN VIOLET) 1 % topical solution Apply topically to affected area every 2-3 days as needed. May wash off in between applications. 59 mL 0   Glucose Blood (BLOOD GLUCOSE TEST STRIPS) STRP 1 each by In Vitro route in the morning, at noon, and at bedtime. May substitute to any manufacturer covered by patient's insurance. 100 strip 11   hydrOXYzine (ATARAX) 25 MG tablet Take 1 tablet (25 mg total) by mouth 3 (three) times daily as needed. 90 tablet 1   ketoconazole (NIZORAL) 2 % cream Apply 1 Application topically daily. Apply 1gm to skin on plantar aspect of affected foot, and massage in well.  Apply once daily. 60 g 2   LORazepam (ATIVAN) 0.5 MG tablet Take 1 tablet (0.5 mg total) by mouth 2 (two) times daily as needed for anxiety. 30 tablet 0   losartan (COZAAR) 25 MG tablet Take 0.5 tablets (12.5 mg total) by mouth daily. 90 tablet 1   magnesium oxide (MAG-OX) 400 MG tablet Take 1 tablet (400 mg total) by mouth at bedtime. 90 tablet 1   metFORMIN (GLUCOPHAGE-XR) 500 MG 24 hr tablet Take 1 tablet (500 mg total) by mouth 2 (two) times daily with a meal. 180 tablet 1   metoprolol succinate (TOPROL-XL) 50 MG 24 hr tablet Take 1 tablet (50 mg total) by mouth daily. Take with or immediately following a meal. 90 tablet 1   NON FORMULARY Pt uses cpap nightly     ondansetron (ZOFRAN-ODT) 4 MG disintegrating tablet Take 1 tablet (4 mg total) by mouth every 8 (eight) hours as needed for nausea or vomiting. 60 tablet 1   piperacillin-tazobactam (ZOSYN) 4.5 (4-0.5) g injection Inject 10 g into the vein over 24 hr.     polyethylene glycol powder (GLYCOLAX/MIRALAX) 17 GM/SCOOP powder Take 1 capful (17 g) in water 2 (two) times daily. 765 g 0   potassium chloride SA (KLOR-CON M) 20 MEQ tablet Take 1 tablet (20 mEq total) by mouth daily. 90  tablet 1   senna-docusate (SENOKOT-S) 8.6-50 MG tablet Take 2 tablets by mouth at bedtime. 60 tablet 0   spironolactone (ALDACTONE) 25 MG tablet Take 0.5 tablets (12.5 mg total) by mouth daily. 90 tablet 1   terconazole (TERAZOL 3) 80 MG vaginal suppository Place 1 suppository (80 mg total) vaginally at bedtime. 3 suppository 2   vortioxetine HBr (TRINTELLIX) 5 MG TABS tablet Take 1 tablet (5 mg total) by mouth daily. 30 tablet 1   No facility-administered medications prior to visit.    ROS Review of Systems  Constitutional:  Positive for fatigue and unexpected weight change (wt gain). Negative for appetite change, chills, diaphoresis and fever.  HENT: Negative.    Eyes: Negative.   Respiratory:  Negative for cough, chest tightness, shortness of breath and wheezing.   Cardiovascular:  Negative for chest pain, palpitations and leg swelling.  Gastrointestinal:  Positive for constipation. Negative for abdominal pain, blood in stool, diarrhea and vomiting.  Endocrine: Negative.   Genitourinary:  Positive for dysuria.  Negative for difficulty urinating.  Musculoskeletal:  Positive for arthralgias, back pain and gait problem. Negative for joint swelling and myalgias.  Skin:  Positive for wound.  Neurological:  Positive for weakness.  Hematological:  Negative for adenopathy. Does not bruise/bleed easily.  Psychiatric/Behavioral:  Positive for dysphoric mood and sleep disturbance. Negative for confusion, decreased concentration and suicidal ideas. The patient is nervous/anxious.     Objective:  BP 123/81 (BP Location: Right Wrist, Patient Position: Sitting)   Pulse 90   Temp 98 F (36.7 C) (Oral)   Resp 16   Ht 5\' 4"  (1.626 m)   Wt 249 lb 9.6 oz (113.2 kg)   LMP  (LMP Unknown)   SpO2 95%   BMI 42.84 kg/m   BP Readings from Last 3 Encounters:  05/22/23 123/81  05/21/23 123/82  05/21/23 (!) 166/80    Wt Readings from Last 3 Encounters:  05/22/23 249 lb 9.6 oz (113.2 kg)  05/21/23  246 lb (111.6 kg)  05/19/23 246 lb (111.6 kg)    Physical Exam Vitals reviewed.  Constitutional:      General: She is not in acute distress.    Appearance: She is ill-appearing. She is not toxic-appearing or diaphoretic.  HENT:     Nose: Nose normal.     Mouth/Throat:     Mouth: Mucous membranes are moist.  Eyes:     General: No scleral icterus.    Conjunctiva/sclera: Conjunctivae normal.  Neck:     Thyroid: No thyroid mass or thyroid tenderness.  Cardiovascular:     Rate and Rhythm: Normal rate and regular rhythm.     Heart sounds: No murmur heard.    No friction rub. No gallop.  Pulmonary:     Effort: Pulmonary effort is normal.     Breath sounds: No stridor. No wheezing, rhonchi or rales.  Abdominal:     General: Abdomen is protuberant. Bowel sounds are normal. There is no distension.     Palpations: Abdomen is soft. There is no hepatomegaly, splenomegaly or mass.     Tenderness: There is no abdominal tenderness.  Musculoskeletal:        General: Normal range of motion.     Cervical back: Neck supple.     Right lower leg: No edema.     Left lower leg: No edema.  Lymphadenopathy:     Cervical: No cervical adenopathy.     Right cervical: No superficial, deep or posterior cervical adenopathy.    Left cervical: No superficial, deep or posterior cervical adenopathy.  Skin:    General: Skin is warm and dry.     Findings: No rash.  Neurological:     General: No focal deficit present.     Mental Status: She is alert.     Lab Results  Component Value Date   WBC 8.9 02/28/2023   HGB 14.6 04/15/2023   HCT 43.0 04/15/2023   PLT 284 02/28/2023   GLUCOSE 135 (H) 04/15/2023   CHOL 194 08/13/2022   TRIG 208 (H) 12/06/2022   HDL 48.90 08/13/2022   LDLDIRECT 143.8 04/03/2011   LDLCALC 117 (H) 08/13/2022   ALT 30 (H) 02/28/2023   AST 26 02/28/2023   NA 141 04/15/2023   K 3.4 (L) 04/15/2023   CL 101 04/15/2023   CREATININE 1.10 (H) 04/15/2023   BUN 18 04/15/2023   CO2  24 02/28/2023   TSH 4.29 01/15/2023   INR 1.2 02/25/2022   HGBA1C 5.4 05/22/2023   MICROALBUR 1.0 05/22/2023  No results found.  Assessment & Plan:  Spondylosis without myelopathy or radiculopathy, lumbar region -     Morphine Sulfate ER; Take 1 tablet (15 mg total) by mouth every 12 (twelve) hours.  Dispense: 60 tablet; Refill: 0  Chronic bilateral low back pain without sciatica -     Morphine Sulfate ER; Take 1 tablet (15 mg total) by mouth every 12 (twelve) hours.  Dispense: 60 tablet; Refill: 0  Degeneration of intervertebral disc of lumbar region with discogenic back pain and lower extremity pain -     Morphine Sulfate ER; Take 1 tablet (15 mg total) by mouth every 12 (twelve) hours.  Dispense: 60 tablet; Refill: 0  Type 2 diabetes mellitus with other specified complication, without long-term current use of insulin (HCC)- Blood sugar is very well controlled.. -     Hemoglobin A1c; Future -     Urinalysis, Routine w reflex microscopic; Future -     Microalbumin / creatinine urine ratio; Future -     HM Diabetes Foot Exam  Encounter for palliative care involving management of pain -     Morphine Sulfate ER; Take 1 tablet (15 mg total) by mouth every 12 (twelve) hours.  Dispense: 60 tablet; Refill: 0  Neuropathic pain -     Morphine Sulfate ER; Take 1 tablet (15 mg total) by mouth every 12 (twelve) hours.  Dispense: 60 tablet; Refill: 0     Follow-up: No follow-ups on file.  Sanda Linger, MD

## 2023-05-22 NOTE — Telephone Encounter (Signed)
 Received faxed results from 3/17 with slightly increased Scr ~1.34 where baseline has been ~0.9. Have not received this week's labs. Spoke with Amy ([facility]) who has also not received labs. Having her team investigate further. Patient's end date is 3/31.  Margarite Gouge, PharmD, CPP, BCIDP, AAHIVP Clinical Pharmacist Practitioner Infectious Diseases Clinical Pharmacist [facility]

## 2023-05-23 ENCOUNTER — Other Ambulatory Visit (HOSPITAL_COMMUNITY): Payer: Self-pay

## 2023-05-23 ENCOUNTER — Telehealth: Payer: Self-pay | Admitting: Pharmacy Technician

## 2023-05-23 DIAGNOSIS — E1159 Type 2 diabetes mellitus with other circulatory complications: Secondary | ICD-10-CM | POA: Diagnosis not present

## 2023-05-23 DIAGNOSIS — I5042 Chronic combined systolic (congestive) and diastolic (congestive) heart failure: Secondary | ICD-10-CM | POA: Diagnosis not present

## 2023-05-23 DIAGNOSIS — F333 Major depressive disorder, recurrent, severe with psychotic symptoms: Secondary | ICD-10-CM | POA: Diagnosis not present

## 2023-05-23 DIAGNOSIS — Z452 Encounter for adjustment and management of vascular access device: Secondary | ICD-10-CM | POA: Diagnosis not present

## 2023-05-23 DIAGNOSIS — T8149XA Infection following a procedure, other surgical site, initial encounter: Secondary | ICD-10-CM | POA: Diagnosis not present

## 2023-05-23 DIAGNOSIS — I152 Hypertension secondary to endocrine disorders: Secondary | ICD-10-CM | POA: Diagnosis not present

## 2023-05-23 DIAGNOSIS — B9689 Other specified bacterial agents as the cause of diseases classified elsewhere: Secondary | ICD-10-CM | POA: Diagnosis not present

## 2023-05-23 LAB — MICROALBUMIN / CREATININE URINE RATIO
Creatinine,U: 113.2 mg/dL
Microalb Creat Ratio: 8.8 mg/g (ref 0.0–30.0)
Microalb, Ur: 1 mg/dL (ref 0.0–1.9)

## 2023-05-23 LAB — URINALYSIS, ROUTINE W REFLEX MICROSCOPIC
Bilirubin Urine: NEGATIVE
Hgb urine dipstick: NEGATIVE
Leukocytes,Ua: NEGATIVE
Nitrite: NEGATIVE
Specific Gravity, Urine: 1.02 (ref 1.000–1.030)
Total Protein, Urine: NEGATIVE
Urine Glucose: 500 — AB
Urobilinogen, UA: 0.2 (ref 0.0–1.0)
pH: 6 (ref 5.0–8.0)

## 2023-05-23 NOTE — Telephone Encounter (Signed)
 Pharmacy Patient Advocate Encounter   Received notification from Onbase that prior authorization for  Morphine Sulfate ER 15MG  er tablets is required/requested.   Insurance verification completed.   The patient is insured through [facility] [place] .   Per test claim: The current 30 day co-pay is, $1.60.  No PA needed at this time. This test claim was processed through [facility]- copay amounts may vary at other pharmacies due to pharmacy/plan contracts, or as the patient moves through the different stages of their insurance plan.

## 2023-05-23 NOTE — Telephone Encounter (Signed)
 Let's wait till 3/31 then  I think it's ok still  Thanks Marchelle Folks

## 2023-05-23 NOTE — Telephone Encounter (Signed)
**Note De-identified  [place] Obfuscation** Please advise 

## 2023-05-24 LAB — HEMOGLOBIN A1C: Hgb A1c MFr Bld: 5.4 % (ref 4.6–6.5)

## 2023-05-26 ENCOUNTER — Telehealth: Payer: Self-pay

## 2023-05-26 ENCOUNTER — Ambulatory Visit

## 2023-05-26 ENCOUNTER — Other Ambulatory Visit (HOSPITAL_COMMUNITY)
Admission: RE | Admit: 2023-05-26 | Discharge: 2023-05-26 | Disposition: A | Discharge status: Home / Self Care | Source: Other Acute Inpatient Hospital | Attending: Plastic Surgery | Admitting: Plastic Surgery

## 2023-05-26 ENCOUNTER — Ambulatory Visit: Payer: Self-pay

## 2023-05-26 ENCOUNTER — Other Ambulatory Visit: Payer: Self-pay

## 2023-05-26 ENCOUNTER — Other Ambulatory Visit (HOSPITAL_COMMUNITY): Payer: Self-pay

## 2023-05-26 ENCOUNTER — Telehealth: Payer: Self-pay | Admitting: *Deleted

## 2023-05-26 ENCOUNTER — Ambulatory Visit (INDEPENDENT_AMBULATORY_CARE_PROVIDER_SITE_OTHER): Admitting: Plastic Surgery

## 2023-05-26 VITALS — Wt 248.6 lb

## 2023-05-26 DIAGNOSIS — T148XXA Other injury of unspecified body region, initial encounter: Secondary | ICD-10-CM | POA: Diagnosis not present

## 2023-05-26 DIAGNOSIS — L089 Local infection of the skin and subcutaneous tissue, unspecified: Secondary | ICD-10-CM | POA: Diagnosis not present

## 2023-05-26 DIAGNOSIS — S81802D Unspecified open wound, left lower leg, subsequent encounter: Secondary | ICD-10-CM

## 2023-05-26 NOTE — Telephone Encounter (Signed)
 Pt called back and explained that she has been in great pain since her visit on Thursday because she couldn't receive her medication. She's very anxious about the process because she hasn't heard any updates.   I re-assured her that the nurses have been actively working her prior auth and its currently pending. She asked for a nurse to call her with updates.

## 2023-05-26 NOTE — Telephone Encounter (Signed)
 Received on (05/21/2023) via of fax Physician Order from [facility].  Requesting signature,date,and return.  Given to provider to complete.    Physician Order signed,dated,and faxed back to [facility].  Confirmation received and copy scanned into the chart.//AB/CMA

## 2023-05-26 NOTE — Telephone Encounter (Signed)
 Pharmacy Patient Advocate Encounter   Received notification from Pt Calls Messages that prior authorization for Morphine Sulfate ER 15mg  tabs is required/requested.   Insurance verification completed.   The patient is insured through [facility]  [place] .   Per test claim: PA required; PA submitted to above mentioned insurance via CoverMyMeds Key/confirmation #/EOC [id number] Status is pending

## 2023-05-26 NOTE — Telephone Encounter (Signed)
 Since she has not been prescribed any ER tablets, she can try crushing Augmentin and cipro and taking with a small spoonful of applesauce or jello (or the like). If she were to take the liquid forms, she would be taking a significant amount of liquid every day and would require frequent filling through the pharmacy.

## 2023-05-26 NOTE — Telephone Encounter (Signed)
 Chief Complaint: Pain Symptoms: 9/10 left leg pain Frequency: Ongoing since October Pertinent Negatives: Patient denies fever Disposition: [] ED /[] Urgent Care (no appt availability in office) / [] Appointment(In office/virtual)/ []  [facility]/ [] Home Care/ [] Refused Recommended Disposition /[] [facility]/ [x]  Follow-up with PCP Additional Notes: Pt reports she was seen in the office on 03/27, she is awaiting PA for the morphine and was to check the status. Advised it is in progress, pt verbalized understanding. Pt states she was seen in office today with ID. This RN educated pt on home care, new-worsening symptoms, when to call back/seek emergent care. Pt verbalized understanding and agrees to plan.    Copied from CRM [id number]. Topic: Clinical - Red Word Triage >> May 26, 2023 12:26 PM [name] wrote: Red Word that prompted transfer to Nurse Triage: wound on leg, back pain, rated 9/10 pain. Answer Assessment - Initial Assessment Questions 1. APPEARANCE of SORES: "What do the sores look like?"     Large open wound 2. NUMBER: "How many sores are there?"     1 large open wound 4. LOCATION: "Where are the sores located?"     Front left leg 5. ONSET: "When did the sores begin?"     Began in October 6. TENDER: "Does it hurt when you touch it?"  (Scale 1-10; or mild, moderate, severe)      9/10, oxycodone is not managing pain for pt  Protocols used: Sores-A-AH

## 2023-05-26 NOTE — Telephone Encounter (Signed)
 Patient here for midline removal and has additional questions regarding her treatment plan.   She would like Dr. Renold Don to know that Dr. Jean Rosenthal collected a wound culture this morning.  She was given prescriptions for Augmentin and ciprofloxacin, but states she had discussed with Dr. Renold Don that she would be getting liquid oral antibiotics. She reports she will hold off on starting oral antibiotics until she hears back from provider.   Additionally, she reports changes to her taste and smell since starting IV antibiotics and wants to know if she should expect that to improve or stay the same on oral antibiotics. States she can only taste lemon and salt, but that her sense of smell is stronger than before and she is very sensitive to smells. Will route to provider.   Sandie Ano, RN

## 2023-05-26 NOTE — Progress Notes (Signed)
 Mr. Lynelle Doctor returns today for evaluation of the wound on the anterior portion of her left leg and knee.  She states that she is still having pain but she is now able to stand without assistance and is ambulating with a walker.  On examination her wound looks like she has had significant progress and reepithelializing the soft tissues.  There is an area approximately 1 x 1.5 cm over the medial portion of the patellar tendon which is not completely granulated but the wound has made progress since the last time I saw her.  I took a small portion of tissue for culture today but there is very little green left in the wound and I suspect the Pseudomonas burden has decreased.  Will continue dressing changes.  Patient may begin massaging and moisturizing the epithelialized surfaces of the wound.  Patient may begin physical therapy with no restrictions.  Follow-up in 2 to 3 weeks.  20 minutes spent examining patient reviewing chart coordinating care and documenting.

## 2023-05-26 NOTE — Progress Notes (Addendum)
 Midline Removal    Antibiotic infusing when patient arrived to clinic. Line clamped and antibiotic ball disconnected prior to removing line.   Midline length & location:  left cephalic 10 cm Removed per verbal order from: Dr. Daiva Eves, see documented orders from 05/01/23  Blood thinners:  none Platelet count:  243 ([facility]  05/23/23)  Site assessment: Dressing clean and dry. Extremity warm and dry. No redness, drainage, or swelling present at insertion site.   Pre-removal vital signs:  BP:  116/83 HR:  89 SpO2:  96%  Insertion site positioned below level of heart. No sutures present. Insertion site cleaned with CHG, catheter removed and petroleum dressing applied. Tip intact. Pressure held until hemostasis achieved.    Length of catheter removed:  10 cm   Provided patient with after care instructions and precautions print out (via [employer] Clinical Key). Reviewed this information with patient.   Patient verbalized understanding and agreement, all questions answered. Patient tolerated procedure well and remained in clinic under the care of RN 30 minutes post removal.  Post-observation vital signs:  BP:  118/80 HR:  85 SpO2:  96%  Notified Jeri Modena, RN with [facility] and [facility] pharmacy team of removal.  Patient has questions about PICC insertion site on right arm. Recently had PICC removed. She is concerned about redness. There is approximately 0.25 cm of redness in diameter around the healing insertion site. No streaking, swelling, or drainage present. Infection precautions provided to patient.   Sandie Ano, RN

## 2023-05-26 NOTE — Telephone Encounter (Signed)
 Patient's spouse called with additional questions and reports they received a voicemail from our office regarding lab work. Relayed that it does not appear anyone tried to reach out to them.   He is concerned about Shacoya not having liquid antibiotics. He reports she's on a lot of pills and that they cause heartburn and chest discomfort for her. Discussed pharmacy's recommendation to crush and take in jello/applesauce. He would still like provider to consider liquid formulation.   He also would like more information on why he needs to ask surgery for wound care referral. States Renate has home health that provides dressing changes.   Sandie Ano, RN

## 2023-05-27 ENCOUNTER — Other Ambulatory Visit (HOSPITAL_COMMUNITY): Payer: Self-pay

## 2023-05-27 DIAGNOSIS — I5042 Chronic combined systolic (congestive) and diastolic (congestive) heart failure: Secondary | ICD-10-CM | POA: Diagnosis not present

## 2023-05-27 DIAGNOSIS — T8149XA Infection following a procedure, other surgical site, initial encounter: Secondary | ICD-10-CM | POA: Diagnosis not present

## 2023-05-27 DIAGNOSIS — I152 Hypertension secondary to endocrine disorders: Secondary | ICD-10-CM | POA: Diagnosis not present

## 2023-05-27 DIAGNOSIS — F333 Major depressive disorder, recurrent, severe with psychotic symptoms: Secondary | ICD-10-CM | POA: Diagnosis not present

## 2023-05-27 DIAGNOSIS — E1159 Type 2 diabetes mellitus with other circulatory complications: Secondary | ICD-10-CM | POA: Diagnosis not present

## 2023-05-27 DIAGNOSIS — B9689 Other specified bacterial agents as the cause of diseases classified elsewhere: Secondary | ICD-10-CM | POA: Diagnosis not present

## 2023-05-27 NOTE — Progress Notes (Unsigned)
 Cardiology Office Note:   Date:  05/29/2023  ID:  [name], DOB 1965-10-11, MRN [id number] PCP: [name], MD  [facility] Providers Cardiologist:  [name], MD {  History of Present Illness:   [name] is a 58 y.o. female with a history of a nonischemic cardiomyopathy.  She had cardiac arrest on October 10.  This was a V-fib arrest requiring ventilator support with prolonged resuscitation.  She had some short-term memory deficits but actually recovered remarkably well following this.  Unfortunately she had an intraosseous IV in the field and got cellulitis related to this with a full-thickness wound that has required plastic surgery for debridement.  Because of that she did not get an ICD and was sent home on a LifeVest while this wound is healing.  She did have a cardiac catheterization as part of her evaluation.  This demonstrated normal coronaries.  MRI demonstrated an EF of 31%.  Her echocardiogram demonstrated an EF to be 30 to 35%.  There were no other abnormal findings.  She has had follow-up in the wound clinic.  She has been followed in the [facility] and in EP.    She has had trouble wearing her LifeVest because it is uncomfortable and she thinks she is going to start getting ulcers.  The company came out 2 days ago and tried to refill it and she says it still not working.  She says she is going to start not wearing it and she is already only wearing it a few days a week.  She is not having any new shortness of breath, PND or orthopnea.  She is not having any new palpitations, presyncope or syncope.  She has chronic leg pain related to the wound.  She has been walking with a walker.   ROS: As stated in the HPI and negative for all other systems.  Studies Reviewed:    EKG:   NA  Risk Assessment/Calculations:              Physical Exam:   VS:  BP 118/83 (BP Location: Left Arm, Patient Position: Sitting, Cuff Size: Normal)   Pulse 97   Ht 5\' 4"   (1.626 m)   Wt 249 lb (112.9 kg)   LMP  (LMP Unknown)   BMI 42.74 kg/m    Wt Readings from Last 3 Encounters:  05/29/23 249 lb (112.9 kg)  05/26/23 248 lb 9.6 oz (112.8 kg)  05/22/23 249 lb 9.6 oz (113.2 kg)     GEN: Well nourished, well developed in no acute distress NECK: No JVD; No carotid bruits CARDIAC: RRR, no murmurs, rubs, gallops RESPIRATORY:  Clear to auscultation without rales, wheezing or rhonchi  ABDOMEN: Soft, non-tender, non-distended EXTREMITIES:  Mild  edema; bandaged left lower extremity  ASSESSMENT AND PLAN:   Chronic HFrEF, NICM : We have been very slow and increasing her medications because of lightheadedness and low blood pressures.  Today I think I can increase her Cozaar to 25 mg a day.  She seems to be euvolemic.  We will see her back in a couple months to make further med titration.   Cardiac Arrest, VF: She is having trouble wearing the LifeVest.  I am going to send her back to EP.  There is still a small wound that is slowly granulating in and positive for Pseudomonas but likely low risk for bacteremia.  She has no indwelling IVs anymore.  She is on oral antibiotics.   Will need  to consider the timing of ICD.   Chronic [name] wound:   I did text her infectious disease doctor today because she is having trouble swallowing the pills and wants to be put back on liquids oral.   DMII: A1c was 5.4.  She is lost quite a bit of weight.  She will continue meds as listed.   OSA:     She has not been using her CPAP.  She is encouraged to use this.   Follow up with Azalee Course PA in 2 months.   Signed, [name], MD

## 2023-05-27 NOTE — Telephone Encounter (Signed)
 Pharmacy Patient Advocate Encounter  Received notification from [facility] Medicaid that Prior Authorization for Morphine Sulfate ER 15mg  tabs has been APPROVED from 05/26/23 to 02/24/2098   PA #/Case ID/Reference #: [id number]  Left a voice message at [facility] to notify of the approval.

## 2023-05-28 DIAGNOSIS — I152 Hypertension secondary to endocrine disorders: Secondary | ICD-10-CM | POA: Diagnosis not present

## 2023-05-28 DIAGNOSIS — E1159 Type 2 diabetes mellitus with other circulatory complications: Secondary | ICD-10-CM | POA: Diagnosis not present

## 2023-05-28 DIAGNOSIS — T8149XA Infection following a procedure, other surgical site, initial encounter: Secondary | ICD-10-CM | POA: Diagnosis not present

## 2023-05-28 DIAGNOSIS — I5042 Chronic combined systolic (congestive) and diastolic (congestive) heart failure: Secondary | ICD-10-CM | POA: Diagnosis not present

## 2023-05-28 DIAGNOSIS — F333 Major depressive disorder, recurrent, severe with psychotic symptoms: Secondary | ICD-10-CM | POA: Diagnosis not present

## 2023-05-28 DIAGNOSIS — B9689 Other specified bacterial agents as the cause of diseases classified elsewhere: Secondary | ICD-10-CM | POA: Diagnosis not present

## 2023-05-29 ENCOUNTER — Encounter: Payer: Self-pay | Admitting: Cardiology

## 2023-05-29 ENCOUNTER — Ambulatory Visit: Payer: Medicare Other | Attending: Cardiology | Admitting: Cardiology

## 2023-05-29 VITALS — BP 118/83 | HR 97 | Ht 64.0 in | Wt 249.0 lb

## 2023-05-29 DIAGNOSIS — I5022 Chronic systolic (congestive) heart failure: Secondary | ICD-10-CM | POA: Diagnosis not present

## 2023-05-29 DIAGNOSIS — I4901 Ventricular fibrillation: Secondary | ICD-10-CM | POA: Diagnosis not present

## 2023-05-29 DIAGNOSIS — G4733 Obstructive sleep apnea (adult) (pediatric): Secondary | ICD-10-CM | POA: Insufficient documentation

## 2023-05-29 DIAGNOSIS — I469 Cardiac arrest, cause unspecified: Secondary | ICD-10-CM | POA: Insufficient documentation

## 2023-05-29 DIAGNOSIS — E118 Type 2 diabetes mellitus with unspecified complications: Secondary | ICD-10-CM | POA: Insufficient documentation

## 2023-05-29 MED ORDER — FUROSEMIDE 40 MG PO TABS: 40.0000 mg | ORAL_TABLET | Freq: Every day | ORAL | 3 refills | Status: AC

## 2023-05-29 MED ORDER — LOSARTAN POTASSIUM 25 MG PO TABS: 25.0000 mg | ORAL_TABLET | Freq: Every day | ORAL | 3 refills | Status: DC

## 2023-05-29 NOTE — Telephone Encounter (Signed)
 Unable to reach patient. Left a detailed message of her medication being approved and she may pick it up from her local pharmacy.

## 2023-05-29 NOTE — Patient Instructions (Signed)
 Medication Instructions:  Lasix 40 mg daily, Losartan 25 mg new scripts sent. *If you need a refill on your cardiac medications before your next appointment, please call your pharmacy*   Follow-Up: At [facility], you and your health needs are our priority.  As part of our continuing mission to provide you with exceptional heart care, our providers are all part of one team.  This team includes your primary Cardiologist (physician) and Advanced Practice Providers or APPs (Physician Assistants and Nurse Practitioners) who all work together to provide you with the care you need, when you need it.  Your next appointment:   2 month(s)  Provider:   Azalee Course, PA-C       We recommend signing up for the patient portal called "MyChart".  Sign up information is provided on this After Visit Summary.  MyChart is used to connect with patients for Virtual Visits (Telemedicine).  Patients are able to view lab/test results, encounter notes, upcoming appointments, etc.  Non-urgent messages can be sent to your provider as well.   To learn more about what you can do with MyChart, go to [email or web address].   Other Instructions Research Benfotiamine OTC for neuropathy.      1st Floor: - Lobby - Registration  - Pharmacy  - Lab - Cafe  2nd Floor: - PV Lab - Diagnostic Testing (echo, CT, nuclear med)  3rd Floor: - Vacant  4th Floor: - TCTS (cardiothoracic surgery) - AFib Clinic - Structural Heart Clinic - Vascular Surgery  - Vascular Ultrasound  5th Floor: - [facility] (general and EP) - Clinical Pharmacy for coumadin, hypertension, lipid, weight-loss medications, and med management appointments    Valet parking services will be available as well.

## 2023-05-30 LAB — AEROBIC/ANAEROBIC CULTURE W GRAM STAIN (SURGICAL/DEEP WOUND)

## 2023-06-02 DIAGNOSIS — F988 Other specified behavioral and emotional disorders with onset usually occurring in childhood and adolescence: Secondary | ICD-10-CM | POA: Diagnosis not present

## 2023-06-02 DIAGNOSIS — I4901 Ventricular fibrillation: Secondary | ICD-10-CM | POA: Diagnosis not present

## 2023-06-02 DIAGNOSIS — I428 Other cardiomyopathies: Secondary | ICD-10-CM | POA: Diagnosis not present

## 2023-06-02 DIAGNOSIS — G43909 Migraine, unspecified, not intractable, without status migrainosus: Secondary | ICD-10-CM | POA: Diagnosis not present

## 2023-06-02 DIAGNOSIS — G5601 Carpal tunnel syndrome, right upper limb: Secondary | ICD-10-CM | POA: Diagnosis not present

## 2023-06-02 DIAGNOSIS — I5042 Chronic combined systolic (congestive) and diastolic (congestive) heart failure: Secondary | ICD-10-CM | POA: Diagnosis not present

## 2023-06-02 DIAGNOSIS — E1159 Type 2 diabetes mellitus with other circulatory complications: Secondary | ICD-10-CM | POA: Diagnosis not present

## 2023-06-02 DIAGNOSIS — G8929 Other chronic pain: Secondary | ICD-10-CM | POA: Diagnosis not present

## 2023-06-02 DIAGNOSIS — M4800 Spinal stenosis, site unspecified: Secondary | ICD-10-CM | POA: Diagnosis not present

## 2023-06-02 DIAGNOSIS — E669 Obesity, unspecified: Secondary | ICD-10-CM | POA: Diagnosis not present

## 2023-06-02 DIAGNOSIS — B9689 Other specified bacterial agents as the cause of diseases classified elsewhere: Secondary | ICD-10-CM | POA: Diagnosis not present

## 2023-06-02 DIAGNOSIS — Z6841 Body Mass Index (BMI) 40.0 and over, adult: Secondary | ICD-10-CM | POA: Diagnosis not present

## 2023-06-02 DIAGNOSIS — T8149XA Infection following a procedure, other surgical site, initial encounter: Secondary | ICD-10-CM | POA: Diagnosis not present

## 2023-06-02 DIAGNOSIS — E1169 Type 2 diabetes mellitus with other specified complication: Secondary | ICD-10-CM | POA: Diagnosis not present

## 2023-06-02 DIAGNOSIS — G4733 Obstructive sleep apnea (adult) (pediatric): Secondary | ICD-10-CM | POA: Diagnosis not present

## 2023-06-02 DIAGNOSIS — M170 Bilateral primary osteoarthritis of knee: Secondary | ICD-10-CM | POA: Diagnosis not present

## 2023-06-02 DIAGNOSIS — I252 Old myocardial infarction: Secondary | ICD-10-CM | POA: Diagnosis not present

## 2023-06-02 DIAGNOSIS — M51369 Other intervertebral disc degeneration, lumbar region without mention of lumbar back pain or lower extremity pain: Secondary | ICD-10-CM | POA: Diagnosis not present

## 2023-06-02 DIAGNOSIS — D539 Nutritional anemia, unspecified: Secondary | ICD-10-CM | POA: Diagnosis not present

## 2023-06-02 DIAGNOSIS — Z792 Long term (current) use of antibiotics: Secondary | ICD-10-CM | POA: Diagnosis not present

## 2023-06-02 DIAGNOSIS — M47816 Spondylosis without myelopathy or radiculopathy, lumbar region: Secondary | ICD-10-CM | POA: Diagnosis not present

## 2023-06-02 DIAGNOSIS — I080 Rheumatic disorders of both mitral and aortic valves: Secondary | ICD-10-CM | POA: Diagnosis not present

## 2023-06-02 DIAGNOSIS — F333 Major depressive disorder, recurrent, severe with psychotic symptoms: Secondary | ICD-10-CM | POA: Diagnosis not present

## 2023-06-02 DIAGNOSIS — E785 Hyperlipidemia, unspecified: Secondary | ICD-10-CM | POA: Diagnosis not present

## 2023-06-02 DIAGNOSIS — I152 Hypertension secondary to endocrine disorders: Secondary | ICD-10-CM | POA: Diagnosis not present

## 2023-06-03 DIAGNOSIS — I152 Hypertension secondary to endocrine disorders: Secondary | ICD-10-CM | POA: Diagnosis not present

## 2023-06-03 DIAGNOSIS — F333 Major depressive disorder, recurrent, severe with psychotic symptoms: Secondary | ICD-10-CM | POA: Diagnosis not present

## 2023-06-03 DIAGNOSIS — T8149XA Infection following a procedure, other surgical site, initial encounter: Secondary | ICD-10-CM | POA: Diagnosis not present

## 2023-06-03 DIAGNOSIS — E1159 Type 2 diabetes mellitus with other circulatory complications: Secondary | ICD-10-CM | POA: Diagnosis not present

## 2023-06-03 DIAGNOSIS — B9689 Other specified bacterial agents as the cause of diseases classified elsewhere: Secondary | ICD-10-CM | POA: Diagnosis not present

## 2023-06-03 DIAGNOSIS — I5042 Chronic combined systolic (congestive) and diastolic (congestive) heart failure: Secondary | ICD-10-CM | POA: Diagnosis not present

## 2023-06-05 DIAGNOSIS — B9689 Other specified bacterial agents as the cause of diseases classified elsewhere: Secondary | ICD-10-CM | POA: Diagnosis not present

## 2023-06-05 DIAGNOSIS — I152 Hypertension secondary to endocrine disorders: Secondary | ICD-10-CM | POA: Diagnosis not present

## 2023-06-05 DIAGNOSIS — E1159 Type 2 diabetes mellitus with other circulatory complications: Secondary | ICD-10-CM | POA: Diagnosis not present

## 2023-06-05 DIAGNOSIS — F333 Major depressive disorder, recurrent, severe with psychotic symptoms: Secondary | ICD-10-CM | POA: Diagnosis not present

## 2023-06-05 DIAGNOSIS — I5042 Chronic combined systolic (congestive) and diastolic (congestive) heart failure: Secondary | ICD-10-CM | POA: Diagnosis not present

## 2023-06-05 DIAGNOSIS — T8149XA Infection following a procedure, other surgical site, initial encounter: Secondary | ICD-10-CM | POA: Diagnosis not present

## 2023-06-09 ENCOUNTER — Encounter: Payer: Self-pay | Admitting: Internal Medicine

## 2023-06-09 ENCOUNTER — Ambulatory Visit: Payer: Self-pay | Admitting: Internal Medicine

## 2023-06-09 ENCOUNTER — Other Ambulatory Visit: Payer: Self-pay | Admitting: Internal Medicine

## 2023-06-09 ENCOUNTER — Other Ambulatory Visit (HOSPITAL_COMMUNITY): Payer: Self-pay

## 2023-06-09 ENCOUNTER — Other Ambulatory Visit: Payer: Self-pay

## 2023-06-09 VITALS — BP 109/81 | HR 66 | Wt 239.0 lb

## 2023-06-09 DIAGNOSIS — T148XXD Other injury of unspecified body region, subsequent encounter: Secondary | ICD-10-CM | POA: Diagnosis not present

## 2023-06-09 DIAGNOSIS — T148XXA Other injury of unspecified body region, initial encounter: Secondary | ICD-10-CM

## 2023-06-09 DIAGNOSIS — L089 Local infection of the skin and subcutaneous tissue, unspecified: Secondary | ICD-10-CM | POA: Diagnosis not present

## 2023-06-09 DIAGNOSIS — Z880 Allergy status to penicillin: Secondary | ICD-10-CM

## 2023-06-09 MED ORDER — CIPROFLOXACIN 500 MG/5ML (10%) PO SUSR: 750.0000 mg | Freq: Two times a day (BID) | ORAL | 2 refills | Status: DC

## 2023-06-09 MED ORDER — AMOXICILLIN 400 MG/5ML PO SUSR
1000.0000 mg | Freq: Three times a day (TID) | ORAL | 2 refills | Status: DC
Start: 2023-06-09 — End: 2023-07-29

## 2023-06-09 NOTE — Telephone Encounter (Signed)
 She will crush the cipro - no worries for now. We can keep levofloxacin open in the future!

## 2023-06-09 NOTE — Patient Instructions (Signed)
 Please discuss with dr Carolynne Citron regarding wound clinic referral for quicker resolution of the pseudomonas  Stop augmentin Start amoxicillin three times a day until end of June for the actinomyces    Continue cipro -- liquid form ordered -- plan 2 weeks beyond the skin graft   Labs today   F/u 4 weeks

## 2023-06-09 NOTE — Progress Notes (Signed)
 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   Pseudomonas infection 04/29/2023   Enterococcus faecalis infection 04/29/2023   Memory impairment 03/14/2023   Morbid obesity (HCC) 02/18/2023   Medication management 02/13/2023   Penicillin allergy 02/13/2023   Wound of left lower extremity 02/06/2023   S/P debridement 02/06/2023   Dysuria 01/31/2023   Encounter for palliative care involving management of pain 01/28/2023   Acute post-traumatic wound infection 01/27/2023   Nasal congestion 01/16/2023   Need for immunization against influenza 01/15/2023   Diabetes mellitus (HCC) 12/19/2022   Ventricular fibrillation (HCC) 12/10/2022   Onychomycosis 11/15/2022   Anxiety 10/27/2022   Rosacea, acne 08/13/2022   Primary osteoarthritis of both knees 03/06/2022   Bilateral ovarian cysts 03/06/2022   Major depressive disorder, recurrent episode, severe with anxious distress (HCC) 08/09/2021   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   Need for varicella vaccine 08/09/2021   Urge incontinence 08/01/2021   Tinea corporis 03/14/2021   Hyperlipidemia LDL goal <100 03/01/2021   DDD (degenerative disc disease), lumbar 08/08/2020   Non-seasonal allergic rhinitis due to pollen 07/25/2020   Chronic idiopathic constipation 07/25/2020   Visit for screening mammogram 02/11/2020   Chronic bilateral low back pain without sciatica 02/10/2020   Spondylosis without myelopathy or radiculopathy, lumbar region 12/04/2017   Type 2 diabetes mellitus with hyperglycemia (HCC)    OSA on CPAP 10/28/2014   Chronic combined systolic and diastolic heart failure (HCC) 12/06/2013   Hyperlipidemia associated with type 2 diabetes mellitus (HCC), on Zocor 04/04/2011   Hypertension associated with diabetes (HCC) 06/03/2008   Morbid obesity with BMI of 50.0-59.9, adult (HCC) 07/12/2006   NICM (nonischemic cardiomyopathy) (HCC) 07/12/2006   Migraine  without status migrainosus, not intractable 07/12/2006    Patient's Medications  New Prescriptions   No medications on file  Previous Medications   ACCU-CHEK SOFTCLIX LANCETS LANCETS    Use as instructed   ACETAMINOPHEN (TYLENOL) 325 MG TABLET    Take 1-2 tablets (325-650 mg total) by mouth every 4 (four) hours as needed for mild pain (pain score 1-3).   AMBULATORY NON FORMULARY MEDICATION    Off-loading wheel chair seat cusion Dispense 1 Dx code: M53.3 Use as needed   AMOXICILLIN-CLAVULANATE (AUGMENTIN) 875-125 MG TABLET    Take 1 tablet by mouth 2 (two) times daily.   ATORVASTATIN (LIPITOR) 80 MG TABLET    TAKE 1 TABLET BY MOUTH EVERY DAY   BLOOD GLUCOSE MONITORING SUPPL DEVI    1 each by Does not apply route in the morning, at noon, and at bedtime. May substitute to any manufacturer covered by patient's insurance.   CIPROFLOXACIN (CIPRO) 750 MG TABLET    Take 1 tablet (750 mg total) by mouth 2 (two) times daily.   COLLAGENASE (SANTYL) 250 UNIT/GM OINTMENT    Apply 1 Application topically daily.   DAPAGLIFLOZIN PROPANEDIOL (FARXIGA) 10 MG TABS TABLET    Take 1 tablet (10 mg total) by mouth daily.   FLUTICASONE (FLONASE) 50 MCG/ACT NASAL SPRAY    SPRAY 2 SPRAYS INTO EACH NOSTRIL EVERY DAY   FUROSEMIDE (LASIX) 40 MG TABLET    Take 1 tablet (40 mg total) by mouth daily.   GABAPENTIN (NEURONTIN) 300 MG CAPSULE    Take 1 capsule (300 mg total) by mouth at bedtime.   GENTIAN VIOLET (GNP GENTIAN VIOLET) 1 % TOPICAL SOLUTION    Apply topically to affected area every 2-3 days as needed. May wash  off in between applications.   GLUCOSE BLOOD (BLOOD GLUCOSE TEST STRIPS) STRP    1 each by In Vitro route in the morning, at noon, and at bedtime. May substitute to any manufacturer covered by patient's insurance.   HYDROXYZINE (ATARAX) 25 MG TABLET    Take 1 tablet (25 mg total) by mouth 3 (three) times daily as needed.   KETOCONAZOLE (NIZORAL) 2 % CREAM    Apply 1 Application topically daily. Apply 1gm to  skin on plantar aspect of affected foot, and massage in well.  Apply once daily.   LORAZEPAM (ATIVAN) 0.5 MG TABLET    Take 1 tablet (0.5 mg total) by mouth 2 (two) times daily as needed for anxiety.   LOSARTAN (COZAAR) 25 MG TABLET    Take 1 tablet (25 mg total) by mouth daily.   MAGNESIUM OXIDE (MAG-OX) 400 MG TABLET    Take 1 tablet (400 mg total) by mouth at bedtime.   METFORMIN (GLUCOPHAGE-XR) 500 MG 24 HR TABLET    Take 1 tablet (500 mg total) by mouth 2 (two) times daily with a meal.   METOPROLOL SUCCINATE (TOPROL-XL) 50 MG 24 HR TABLET    Take 1 tablet (50 mg total) by mouth daily. Take with or immediately following a meal.   MORPHINE (MS CONTIN) 15 MG 12 HR TABLET    Take 1 tablet (15 mg total) by mouth every 12 (twelve) hours.   NON FORMULARY    Pt uses cpap nightly   ONDANSETRON (ZOFRAN-ODT) 4 MG DISINTEGRATING TABLET    Take 1 tablet (4 mg total) by mouth every 8 (eight) hours as needed for nausea or vomiting.   PIPERACILLIN-TAZOBACTAM (ZOSYN) 4.5 (4-0.5) G INJECTION    Inject 10 g into the vein over 24 hr.   POLYETHYLENE GLYCOL POWDER (GLYCOLAX/MIRALAX) 17 GM/SCOOP POWDER    Take 1 capful (17 g) in water 2 (two) times daily.   POTASSIUM CHLORIDE SA (KLOR-CON M) 20 MEQ TABLET    Take 1 tablet (20 mEq total) by mouth daily.   SENNA-DOCUSATE (SENOKOT-S) 8.6-50 MG TABLET    Take 2 tablets by mouth at bedtime.   SPIRONOLACTONE (ALDACTONE) 25 MG TABLET    Take 0.5 tablets (12.5 mg total) by mouth daily.   TERCONAZOLE (TERAZOL 3) 80 MG VAGINAL SUPPOSITORY    Place 1 suppository (80 mg total) vaginally at bedtime.   VORTIOXETINE HBR (TRINTELLIX) 5 MG TABS TABLET    Take 1 tablet (5 mg total) by mouth daily.  Modified Medications   No medications on file  Discontinued Medications   No medications on file    Subjective: 58 yo female hx advance heart failure, dm2, recent prolonged admission 12/05/22-12/30/22 for vf cardiac arrest and left knee IO catheter that complicated with chronic wound  here for f/u wound infection  03/13/23 id clinic f/u Reviewed allergy progress note --> pcn testing shows that she doesn't have pcn allergy Complains of nausea/heart burn with abx Leg wound improving per wound care but she still has lots of pain No diarrhea/rash otherwise   ------------- 02/28/23 id initial clinic visit with me I reviewed note from her last visit with dr Elinor Parkinson Pcn allergy -- urgent referral for allergy immunology in setting of actinomyces species on wound cx. Saw dr Murrell Converse 02/27/23 and planned pcn testing/desensitization soon Ortho/plastic seeing patient  01/27/23 seen by pcp and there is a ?swab wound cx that grew pseudomonas (cipro mic 0.5 sensitive; levo 2 resistant) 12/12 I&D of the wound by plastic --  myriad tissue matrix placed over exposed tendon. Operative cx e faecalis, actinomyces species and actinomyces europea Patient had 2 round of ciprofloxacin 12/5 and 12/13; no side effect.  She had another I&D by plastic surgery 12/30.   Patient currently taking linezolid only and had finished cipro  I have no imaging for patient. Given the IO catheter use I worry there could be OM   I also reviewed operative note from 12/12 and 02/24/23 of dr Larraine Plush of plastic surgery 12/12: Didn't appear the debridement needs to go to the bone. Tendon exposed as mentioned above 12/30: Tissue healing but patella tendon remains exposed. I&D done and more tissue matrix placed over the tendon. No operative culture sent  She reports sharp/shooting pain in the area. No f/c. No n/v/d. No blurry vision. No numbness/tingling Will see plastic again in a few days  Doesn't have desensitization until 1/27  Also wears life vest now waiting for icd placement which await clearance of Infection    04/08/23 id clinic f/u Patient had picc placed 2/5 and started on piptazo; amoxicillin hold From previous discussion with plastic surgery would like to treat for pseudomonas colonization with  abx in setting planned placement of graft over the exposed tendon.  She saw cardiology a few days ago for increased peripheral swelling. No sign of overt heart failure (has non-ischemic cardiomyopathy); lasix increased to bid for a few days advised No fever, chill No n/v/diarrhea No rash  She does feel very constipated; she takes oxycodone. 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/2023   ADD (attention deficit disorder)    Anxiety    Arthritis    both knees right worse than left   Carpal tunnel syndrome of right wrist    Chronic back pain    Dysrhythmia    history of Vtach   Enterococcus faecalis infection 04/29/2023   Essential hypertension, benign    Gallstones    History of smoking 06/22/2016   Hyperlipidemia associated with type 2 diabetes mellitus (HCC), on Zocor 04/04/2011   Hypertension associated with diabetes (HCC) 06/03/2008   MDD (major depressive disorder)    Memory impairment    post cardiac arrest   Migraines    Morbid obesity (HCC)    Morbid obesity with BMI of 50.0-59.9, adult (HCC) 07/12/2006   NICM (nonischemic cardiomyopathy) (HCC) 07/12/2006   01/16/18 ECHO:    - Procedure narrative: Transthoracic echocardiography. Image   quality was suboptimal. The study was technically difficult.   Intravenous contrast (Definity) was administered. - Left ventricle: The cavity size was moderately dilated. Wall   thickness was increased in a pattern of mild LVH. Systolic   function was moderately to  severely reduced. The estimated   ejection fraction w   Normal coronary arteries 04/17/2016   OSA on CPAP 10/28/2014   does not use CPAP- pt cannot tolerate CPAP   Pseudomonas infection 04/29/2023   Spinal stenosis    back pain   Spondylosis without myelopathy or radiculopathy, lumbar region 12/04/2017   Type 2 diabetes mellitus with  hyperglycemia (HCC)    Walker as ambulation aid    Past Surgical History:  Procedure Laterality Date   CHOLECYSTECTOMY     DILATATION & CURETTAGE/HYSTEROSCOPY WITH MYOSURE N/A 10/31/2017   Procedure: DILATATION & CURETTAGE/HYSTEROSCOPY WITH MYOSURE;  Surgeon: Romualdo Bolk, MD;  Location: [facility];  Service: Gynecology;  Laterality: N/A;   HYSTEROSCOPY WITH D & C N/A 05/07/2022   Procedure: DILATATION AND CURETTAGE /HYSTEROSCOPY;  Surgeon: [name], MD;  Location: [facility] OR;  Service: Gynecology;  Laterality: N/A;   INCISION AND DRAINAGE OF WOUND Left 02/06/2023   Procedure: debridement of left leg wound, application of myriad;  Surgeon: [name], MD;  Location: [facility] OR;  Service: Plastics;  Laterality: Left;   INCISION AND DRAINAGE OF WOUND Left 02/24/2023   Procedure: application of tissue replacement matrix of left patellar tendon;  Surgeon: [name], MD;  Location: [facility] OR;  Service: Plastics;  Laterality: Left;   INCISION AND DRAINAGE OF WOUND Left 04/15/2023   Procedure: irrigation and debridement to left lower extremity wound;  Surgeon: [name], MD;  Location: [facility] OR;  Service: Plastics;  Laterality: Left;   IRRIGATION AND DEBRIDEMENT KNEE  02/24/2023   Procedure: IRRIGATION AND DEBRIDEMENT KNEE;  Surgeon: [name], MD;  Location: [facility] OR;  Service: Plastics;;   life vest     RIGHT/LEFT HEART CATH AND CORONARY ANGIOGRAPHY N/A 04/11/2016   Procedure: Right/Left Heart Cath and Coronary Angiography;  Surgeon: Kathleene Hazel, MD;  Location: [facility] INVASIVE CV LAB;  Service: Cardiovascular;  Laterality: N/A;    RIGHT/LEFT HEART CATH AND CORONARY ANGIOGRAPHY N/A 12/09/2022   Procedure: RIGHT/LEFT HEART CATH AND CORONARY ANGIOGRAPHY;  Surgeon: Yates Decamp, MD;  Location: [facility] INVASIVE CV LAB;  Service: Cardiovascular;  Laterality: N/A;   sonogram for blood clots     no blockages     Social History   Tobacco Use   Smoking status: Former    Current packs/day: 0.00    Types: Cigarettes    Quit date: 02/25/1997    Years since quitting: 26.3   Smokeless tobacco: Never   Tobacco comments:    Married, lives with spouse (when he is not traveling) and son  Vaping Use   Vaping status: Never Used  Substance Use Topics   Alcohol use: No   Drug use: No    Family History  Problem Relation Age of Onset   Depression Mother    Anxiety disorder Mother    Diabetes Mother    Hypertension Mother    ADD / ADHD Father    Diabetes Father    Hypertension Father    Colitis Father    Hypertension Other    Diabetes Other    Colitis Other    Alcohol abuse Other     Allergies  Allergen Reactions   Fluoxetine Other (See Comments)    More depressed   Cymbalta [Duloxetine Hcl] Other (See Comments)    depressed   Pork-Derived Products Other (See Comments)    Religious beliefs- does not eat pork  Medications ok if necessary    Health Maintenance  Topic Date Due   Zoster Vaccines- Shingrix (1 of 2) Never done   COVID-19 Vaccine (4 - 2024-25 season) 10/27/2022   MAMMOGRAM  07/09/2023   OPHTHALMOLOGY EXAM  08/09/2023   INFLUENZA VACCINE  09/26/2023   HEMOGLOBIN A1C  11/22/2023   FOOT EXAM  05/21/2024   Colonoscopy  02/11/2026   Cervical Cancer Screening (HPV/Pap Cotest)  08/02/2026  DTaP/Tdap/Td (6 - Td or Tdap) 11/08/2027   Pneumococcal Vaccine 36-52 Years old  Completed   Hepatitis C Screening  Completed   HIV Screening  Completed   HPV VACCINES  Aged Out   Meningococcal B Vaccine  Aged Out    Objective: BP 109/81   Pulse 66   Wt 239 lb (108.4 kg)   LMP  (LMP Unknown)   SpO2 96%   BMI  41.02 kg/m   Physical Exam Constitutional:      Appearance: Normal appearance. Morbidly obese  HENT:     Head: Normocephalic and atraumatic.      Mouth: Mucous membranes are moist.  Eyes:    Conjunctiva/sclera: Conjunctivae normal.     Pupils: Pupils are equal, round, and b/l symmetrical   Cardiovascular:     Rate and Rhythm: Normal rate and regular rhythm.     Heart sounds: s1s2. She has a life vest.   Pulmonary:     Effort: Pulmonary effort is normal.     Breath sounds: Normal breath sounds.   Abdominal:     General: Non distended     Palpations: soft.   Musculoskeletal:        General: sitting in the wheelchair  Skin: 02/28/23 picture from 12/30     06/09/23  From 3/31 no change really A small area over the tendon with still greenish discharge   Lab Results Lab Results  Component Value Date   WBC 8.9 02/28/2023   HGB 14.6 04/15/2023   HCT 43.0 04/15/2023   MCV 85.2 02/28/2023   PLT 284 02/28/2023    Lab Results  Component Value Date   CREATININE 1.10 (H) 04/15/2023   BUN 18 04/15/2023   NA 141 04/15/2023   K 3.4 (L) 04/15/2023   CL 101 04/15/2023   CO2 24 02/28/2023    Lab Results  Component Value Date   ALT 30 (H) 02/28/2023   AST 26 02/28/2023   ALKPHOS 42 12/18/2022   BILITOT 1.1 02/28/2023    Lab Results  Component Value Date   CHOL 194 08/13/2022   HDL 48.90 08/13/2022   LDLCALC 117 (H) 08/13/2022   LDLDIRECT 143.8 04/03/2011   TRIG 208 (H) 12/06/2022   CHOLHDL 4 08/13/2022      Component Value Date/Time   CRP 19.3 (H) 02/28/2023 1139   CRP 28.6 (H) 02/13/2023 0257   CRP 2.8 (H) 12/16/2022 1820     Imaging: Reviewed  11/2022 tte  1. Left ventricular ejection fraction, by estimation, is 30 to 35%. The  left ventricle has moderately decreased function. The left ventricle  demonstrates global hypokinesis. There is mild left ventricular  hypertrophy. Indeterminate diastolic filling due   to E-A fusion.   2. Right ventricular  systolic function is normal. The right ventricular  size is normal.   3. The mitral valve is normal in structure. Trivial mitral valve  regurgitation.   4. The aortic valve is tricuspid. Aortic valve regurgitation is not  visualized.   5. The inferior vena cava IVC not well visualized.   Comparison(s): No significant change from prior study.       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  Culture: -01/27/23 is a clinic superficial swab --> pseudomonas (S cipro; R levo) -- I do not know the significance of this superficial cx (open wound is often colonized and  pseudomonas usually do not play a pathogenic role) -12/12 I&D culture E faecalis, Actinomyces spp, Actinomyces europaea.   Tx course: 12/12 I&D and matrix application to tendon 12/30 another matrix application -- wound looks healthy otherwise Abx: Finished 2 courses of cipro 7 day at a time by 12/17 Zyvox started 12/19   -continue zyvox for now in setting of actinomyces/e faecalis (the latter might not be pathogenic either). Doxy possible if needs bridging but want to start amoxicillin -surgery the heavy lifting has been done but would like to treat with amoxicillin for at least 3 months in setting actinomyces found -labs today -f/u 2 weeks -chart sent to dr Wyline Mood requesting sooner pcn testing/desensitization if needed   # Penicillin allergy  Saw allergy and planned desensitization/testing in a few weeks  -would like to see if this can be done sooner say in 1-2 weeks and I'll send message to dr Wyline Mood today (so ICD can be considered and less toxic antibiotics can be used)     # Chronic HFrEF/NICM, VF Cardiac arrest  Fu with cardiology  Plan for ICD once left leg wound heals  -from id standpoint today, agree healed wound would be least risky given portal of entry.  But if more urgent icd placement needed, I am ok with placing it once she is on amoxicillin (this would make it easier for hospital admission to transition to iv penicillin, and also by the time we transition her to penicillin, will know for sure the insignificance of pseudomonas found on superficial tissue culture   03/13/23 id clinic assessment She still wears life vest The wound looks clean from 1/13 and healthy granulating tissue No evidence pcn allergy based on oral challenge and skin prick test by allergy clinic  Stop zyvox today Start amox 750 mg tid; plan at least 3-6 months  She doesn't want iv abx  F/u 4 weeks for labs  F/u plastic surgery    04/08/23 id clinic assessment Patient has 04/15/23 planned grafting of the exposed tendon Exam suggest decreased burden of pseudomonas colonization after a week of piptazo Advised increased volume retention anticipated with her chf and piptazo F/u cardiology as needed Daily weight. Aim for stable weight (after stool catharsis) and use extra dose lasix as needed if daily weight increases by more than 1-2 pounds Continue piptazo Plan at least 2 weeks piptazo beyond surgical grafting for pseudomonas, but the actinomyces will again need several months treatment and likely will transition back to amoxicillin abx    06/09/23 id assessment Based on 05/26/23 wound cx, there is still pseduomonas Concern is that the piptazo mic is hidden which usually suggest resistance   To circle back to the beginning, this is mainly a colonization process rather than infection and given that it's pseudomonas, I believe she can benefit from topical wound care to more quickly rid of the pseudomonas  If we keep on antibiotics alone without aggressive wound care I think we'll blow through antibiotics rather quickly in terms of resistance development  Will send this concern to dr Ladona Ridgel and defer wound referral to him  She request abx pill be changed to  liquid  -cipro 750 bid liquid -- duration I would like to treat 2 weeks beyond skin closure/graft -stop augmentin -continue amox high dose 3 times a day for actinomyces -- duration probably planned to stop end of 07/2023    Raymondo Band, MD [facility] 06/09/2023, 2:01 PM

## 2023-06-09 NOTE — Telephone Encounter (Signed)
 Unsure about cost/coverage with liquid cipro. But yes, it will be a significant amount of liquid. We had previously discussed with her to crush the cipro and either dissolve in a small amount of water or take with a spoonful of jello/yogurt/applesauce, etc.

## 2023-06-09 NOTE — Telephone Encounter (Signed)
 Hey Dr.Vu is there an alternative?

## 2023-06-09 NOTE — Telephone Encounter (Signed)
 Liquid not available for this patient. Would recommend dissolving tablet in water or crushing and eating with small amount of food in spoon.

## 2023-06-10 ENCOUNTER — Ambulatory Visit: Admitting: Student

## 2023-06-10 DIAGNOSIS — S81802D Unspecified open wound, left lower leg, subsequent encounter: Secondary | ICD-10-CM

## 2023-06-10 LAB — COMPLETE METABOLIC PANEL WITHOUT GFR
AG Ratio: 1.5 (calc) (ref 1.0–2.5)
ALT: 25 U/L (ref 6–29)
AST: 37 U/L — ABNORMAL HIGH (ref 10–35)
Albumin: 4.1 g/dL (ref 3.6–5.1)
Alkaline phosphatase (APISO): 53 U/L (ref 37–153)
BUN: 22 mg/dL (ref 7–25)
CO2: 25 mmol/L (ref 20–32)
Calcium: 9.7 mg/dL (ref 8.6–10.4)
Chloride: 103 mmol/L (ref 98–110)
Creat: 0.84 mg/dL (ref 0.50–1.03)
Globulin: 2.7 g/dL (ref 1.9–3.7)
Glucose, Bld: 118 mg/dL — ABNORMAL HIGH (ref 65–99)
Potassium: 4.1 mmol/L (ref 3.5–5.3)
Sodium: 140 mmol/L (ref 135–146)
Total Bilirubin: 0.8 mg/dL (ref 0.2–1.2)
Total Protein: 6.8 g/dL (ref 6.1–8.1)

## 2023-06-10 LAB — CBC
HCT: 47.8 % — ABNORMAL HIGH (ref 35.0–45.0)
Hemoglobin: 15.3 g/dL (ref 11.7–15.5)
MCH: 27.2 pg (ref 27.0–33.0)
MCHC: 32 g/dL (ref 32.0–36.0)
MCV: 84.9 fL (ref 80.0–100.0)
MPV: 10.8 fL (ref 7.5–12.5)
Platelets: 304 10*3/uL (ref 140–400)
RBC: 5.63 10*6/uL — ABNORMAL HIGH (ref 3.80–5.10)
RDW: 13.9 % (ref 11.0–15.0)
WBC: 12.2 10*3/uL — ABNORMAL HIGH (ref 3.8–10.8)

## 2023-06-10 LAB — C-REACTIVE PROTEIN: CRP: 10.7 mg/L — ABNORMAL HIGH (ref <8.0)

## 2023-06-10 NOTE — Progress Notes (Signed)
 Referring Provider [name], MD [address] [place],  [place] [address]   CC: Follow up     [name] is an 58 y.o. female.  HPI: Patient is a 58 year old female with a history of a large wound to her left lower extremity.  She has underwent several debridements and is here for further follow-up of her wound.  Patient was most recently seen in the office by Dr. Ladona Ridgel on 05/26/2023.  At this visit, patient reported she was still having pain, but was now able to stand without assistance and is ambulating with a walker.  On exam, her wound appeared to have significantly progressed and was reepithelializing the soft tissues.  There was an area approximately 1 x 1.5 cm over the medial portion of the patellar tendon which had not yet been completely granulated, but the wound had made progress since the last time she was seen.  A small portion of tissue was taken for culture at this visit as well.  It was recommended that patient massage and moisturize the epithelialized surfaces of the wound.  Patient also may begin physical therapy with no restrictions.  Per chart review, abundant Pseudomonas was noted from the culture taken on 05/26/2023.  Patient was seen by Dr. Renold Don yesterday, 06/09/2023.  Per note, patient has been transition to Augmentin and ciprofloxacin.  It was noted culture from 05/26/2023 showed Pseudomonas and was sensitive to Cipro.  Per Dr. Renold Don, he stated that he believes this is mainly a colonization process rather than an infection.  He reported that he believes she can benefit from topical wound care to more quickly get rid of the Pseudomonas.  Today, patient presents with her husband at bedside.  She states that she is doing okay overall.  She does complain of back and left shoulder pain which she has been following up with sports medicine for.  Her husband states that he has been doing dressing changes twice daily and has also been massaging the epithelialized tissue.  Has been  reports though that sometimes patient does not allow him to do dressing change due to pain.  Patient denies any fevers or chills.  She states that she saw Dr. Renold Don yesterday and that he mention patient possibly transitioning to the wound care team.  Review of Systems General: Denies any fevers or chills  Physical Exam    06/09/2023    1:56 PM 05/29/2023    3:25 PM 05/26/2023   11:58 AM  Vitals with BMI  Height  5\' 4"    Weight 239 lbs 249 lbs   BMI 41 42.72   Systolic 109 118 604  Diastolic 81 83 80  Pulse 66 97 85    General:  No acute distress,  Alert and oriented, Non-Toxic, Normal speech and affect On exam, patient is sitting upright in no acute distress.  Wound to the left lower extremity appears to have improved significantly.  Wound measures approximately 11 cm x 12 cm at its longest/widest.  There appears to be healthy granulation tissue throughout the majority of the wound, there is some superficial exudate noted to the superior aspect of the wound.  Granulation tissue appears to be starting to cover the tendon.  There still does appear to be a little bit of exposed tendon noted centrally.  There is improved epithelialization at the borders with some scar tissue noted throughout the epithelialization.  There are no obvious signs of infection on exam.  Assessment/Plan  Wound of left lower  extremity, subsequent encounter   Recommended the patient continue wound care twice daily to the wound.  Discussed with her the importance of twice daily dressing changes.  Patient expressed understanding.  Discussed with patient she can continue to massage the epithelialized skin and apply Vaseline to the skin as well.  Recommended she continue to work with physical therapy.  Patient expressed understanding.  Will discuss today's visit and Dr. Everlean Hoar recommendations with Dr. Carolynne Citron.  Recommended that patient follow-up with Dr. Carolynne Citron in 2 weeks.  Instructed patient and husband to call in the  meantime if they have any questions or concerns about anything.  Pictures were obtained of the patient and placed in the chart with the patient's or guardian's permission.   Harden Leyden 06/10/2023, 12:45 PM

## 2023-06-11 DIAGNOSIS — I5042 Chronic combined systolic (congestive) and diastolic (congestive) heart failure: Secondary | ICD-10-CM | POA: Diagnosis not present

## 2023-06-11 DIAGNOSIS — F333 Major depressive disorder, recurrent, severe with psychotic symptoms: Secondary | ICD-10-CM | POA: Diagnosis not present

## 2023-06-11 DIAGNOSIS — E1159 Type 2 diabetes mellitus with other circulatory complications: Secondary | ICD-10-CM | POA: Diagnosis not present

## 2023-06-11 DIAGNOSIS — T8149XA Infection following a procedure, other surgical site, initial encounter: Secondary | ICD-10-CM | POA: Diagnosis not present

## 2023-06-11 DIAGNOSIS — I152 Hypertension secondary to endocrine disorders: Secondary | ICD-10-CM | POA: Diagnosis not present

## 2023-06-11 DIAGNOSIS — B9689 Other specified bacterial agents as the cause of diseases classified elsewhere: Secondary | ICD-10-CM | POA: Diagnosis not present

## 2023-06-12 ENCOUNTER — Other Ambulatory Visit: Payer: Self-pay | Admitting: Family Medicine

## 2023-06-12 DIAGNOSIS — E1159 Type 2 diabetes mellitus with other circulatory complications: Secondary | ICD-10-CM | POA: Diagnosis not present

## 2023-06-12 DIAGNOSIS — F333 Major depressive disorder, recurrent, severe with psychotic symptoms: Secondary | ICD-10-CM | POA: Diagnosis not present

## 2023-06-12 DIAGNOSIS — T8149XA Infection following a procedure, other surgical site, initial encounter: Secondary | ICD-10-CM | POA: Diagnosis not present

## 2023-06-12 DIAGNOSIS — B9689 Other specified bacterial agents as the cause of diseases classified elsewhere: Secondary | ICD-10-CM | POA: Diagnosis not present

## 2023-06-12 DIAGNOSIS — I152 Hypertension secondary to endocrine disorders: Secondary | ICD-10-CM | POA: Diagnosis not present

## 2023-06-12 DIAGNOSIS — F411 Generalized anxiety disorder: Secondary | ICD-10-CM

## 2023-06-12 DIAGNOSIS — I5042 Chronic combined systolic (congestive) and diastolic (congestive) heart failure: Secondary | ICD-10-CM | POA: Diagnosis not present

## 2023-06-12 NOTE — Telephone Encounter (Signed)
 I saw her for an acute visit for something else and prescribed this with consult to her psych provider while she was in the office. I will not be the one managing her anxiety. I think she needs to follow up with psych or PCP

## 2023-06-13 ENCOUNTER — Telehealth: Payer: Self-pay

## 2023-06-13 NOTE — Telephone Encounter (Signed)
 Returned [name] with [place] call, LMVM advising I spoke with Anastacio Balm our PA who has been treating patient would like to see [name] on her next follow up visit on 04/29 to observe wound before changing any wound care instructions, or adding collagen as [name] suggested.

## 2023-06-16 ENCOUNTER — Other Ambulatory Visit: Payer: Self-pay | Admitting: Internal Medicine

## 2023-06-16 DIAGNOSIS — F411 Generalized anxiety disorder: Secondary | ICD-10-CM

## 2023-06-16 MED ORDER — HYDROXYZINE HCL 25 MG PO TABS: 25.0000 mg | ORAL_TABLET | Freq: Three times a day (TID) | ORAL | 0 refills | Status: DC | PRN

## 2023-06-17 ENCOUNTER — Telehealth: Payer: Self-pay

## 2023-06-17 ENCOUNTER — Ambulatory Visit: Payer: Self-pay | Admitting: Licensed Clinical Social Worker

## 2023-06-17 DIAGNOSIS — B9689 Other specified bacterial agents as the cause of diseases classified elsewhere: Secondary | ICD-10-CM | POA: Diagnosis not present

## 2023-06-17 DIAGNOSIS — F333 Major depressive disorder, recurrent, severe with psychotic symptoms: Secondary | ICD-10-CM | POA: Diagnosis not present

## 2023-06-17 DIAGNOSIS — E1159 Type 2 diabetes mellitus with other circulatory complications: Secondary | ICD-10-CM | POA: Diagnosis not present

## 2023-06-17 DIAGNOSIS — T8149XA Infection following a procedure, other surgical site, initial encounter: Secondary | ICD-10-CM | POA: Diagnosis not present

## 2023-06-17 DIAGNOSIS — I152 Hypertension secondary to endocrine disorders: Secondary | ICD-10-CM | POA: Diagnosis not present

## 2023-06-17 DIAGNOSIS — I5042 Chronic combined systolic (congestive) and diastolic (congestive) heart failure: Secondary | ICD-10-CM | POA: Diagnosis not present

## 2023-06-17 NOTE — Telephone Encounter (Signed)
 Spoke to Garey, Charity fundraiser and [employer], [place] wants to see patient again before switching her to collagen to see if she has anymore tendon coverage. She stated she will probably transition patient to collagen but not until she sees her on 4/29. Jullie Oiler conveyed understanding.

## 2023-06-17 NOTE — Patient Instructions (Signed)
 Visit Information  Thank you for taking time to visit with me today. Please don't hesitate to contact me if I can be of assistance to you before our next scheduled appointment.  Client to call LCSW for SW support as needed at [phone]  Please call the care guide team at [phone] if you need to cancel or reschedule your appointment.   Following is a copy of your care plan:   Goals Addressed             This Visit's Progress    VBCI Social Work Care Plan       Problems:   Mobility issues            Skin care issues             Anxiety issues             Nursing needs             Mental health  needs (sees counselor at [facility])             Depression issues             Some financial challenges  CSW Clinical Goal(s):   Over the next 30  days the Patient will  attend scheduled medical appointments and mental health appointments AEB patient report and as documented in EPIC record             Over the next 30 days, client will use coping skills to manage depression issues faced AEB client report of reduction in depression symptoms.  Interventions:  Discussed client pain issues. Takes pain medication as prescribed             Discussed client transport needs. Client receives transport help from her spouse            Discussed skin care needs of client. Discussed nurse support for client in the home.              Discussed client hobbies:  she said she watches TV a good bit. She said she has decreased energy            Discussed mental health support. She has met with mental health counselor 2 times (per patient). Counselor is at [facility]            Client has decreased activities. She said she rests on the couch a good bit             Client has sleep difficulty. She spoke of neuropathy issues.                Spoke of program support with RN, LCSW, Pharmacist              Spoke of financial challenges. Spoke of higher food costs               Client spoke of diurectic use.               Client spoke of support of her medical providers              Discussed client vision.  She said she uses reading glasses               Discussed ambulation of client.               Discussed physical therapy support for client  Client is concerned about her sense of taste.                Encouraged client to call LCSW as needed for SW support             Patient Goals/Self-Care Activities:  Client to attend scheduled medical appointments             Client to take medications as prescribed              Client to use coping skills to manage depression issues faced              Client to call LCSW as needed for SW support              Client to participate in scheduled mental health appointments              Plan:   LCSW to call client on June 9,2025 at 3:00 PM        Please go to [facility] [address] [place] [phone]) if you are experiencing a Mental Health or Behavioral Health Crisis or need someone to talk to.  The patient verbalized understanding of instructions, educational materials, and care plan provided today and DECLINED offer to receive copy of patient instructions, educational materials, and care plan.   Client was provided contact information for Care team support. LCSW gave client LCSW name and phone number for SW support for client [phone])   Alexandria Angel  MSW, LCSW [place] Licensed Clinical Social Worker Direct Dial:  [phone] Fax:  [phone] Website:  [email or web address]

## 2023-06-17 NOTE — Patient Outreach (Signed)
 Complex Care Management   Visit Note  06/17/2023  Name:  [name] MRN: [id number] DOB: [birth year]  Situation: Referral received for Complex Care Management related to  client management of depression and for management of health conditions.   I obtained verbal consent from Patient.  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   ADD (attention deficit disorder)    Anxiety    Arthritis    both knees right worse than left   Carpal tunnel syndrome of right wrist    Chronic back pain    Dysrhythmia    history of Vtach   Enterococcus faecalis infection 04/29/2023   Essential hypertension, benign    Gallstones    History of smoking 06/22/2016   Hyperlipidemia associated with type 2 diabetes mellitus (HCC), on Zocor  04/04/2011   Hypertension associated with diabetes (HCC) 06/03/2008   MDD (major depressive disorder)    Memory impairment    post cardiac arrest   Migraines    Morbid obesity (HCC)    Morbid obesity with BMI of 50.0-59.9, adult (HCC) 07/12/2006   NICM (nonischemic cardiomyopathy) (HCC) 07/12/2006   01/16/18 ECHO:    - Procedure narrative: Transthoracic echocardiography. Image   quality was suboptimal. The study was technically difficult.   Intravenous contrast (Definity ) was administered. - Left ventricle: The cavity size was moderately dilated. Wall   thickness was increased in a pattern of mild LVH. Systolic   function was moderately to severely reduced. The estimated   ejection fraction w   Normal coronary arteries 04/17/2016   OSA on CPAP 10/28/2014   does not use CPAP- pt cannot tolerate CPAP   Pseudomonas infection 04/29/2023   Spinal stenosis    back pain   Spondylosis without myelopathy or radiculopathy, lumbar region 12/04/2017   Type 2 diabetes mellitus with hyperglycemia (HCC)    Walker as ambulation aid     Assessment: Patient Reported Symptoms:  Cognitive     Memory impairment    Neurological    Anxiety issues; MDD; ADD  HEENT    Possible hearing issue    Cardiovascular  HTN Weight: 234 lb (106.1 kg)  Respiratory    Short of breath occasionally  Endocrine   Occasional blood sugar checks by client    Gastrointestinal    No issues noted    Genitourinary    Urge incontinence  Integumentary    Unable to access  Musculoskeletal    Walking challenges; pain issues; decreased activities      Psychosocial    Difficulty with taste issues Pain issues; needs transport help; needs help with ADLs occasionally; anxiety issues; stress issues, depression issues; sees counselor for mental health support          06/17/2023    3:44 PM  Depression screen PHQ 2/9  Decreased Interest 1  Down, Depressed, Hopeless 1  PHQ - 2 Score 2  Altered sleeping 1  Tired, decreased energy 1  Change in appetite 1  Feeling bad or failure about yourself  1  Trouble concentrating 1  Moving slowly or fidgety/restless 1  Suicidal thoughts 0  PHQ-9 Score 8  Difficult doing work/chores Somewhat difficult    Vitals:   BP within normal range per client information  Medications Reviewed Today     Reviewed by Afton Horse (Social Worker) on 06/17/23 at 1542  Med List Status: <None>   Medication Order Taking? Sig Documenting  Provider Last Dose Status Informant  Accu-Chek Softclix Lancets lancets [id number] No Use as instructed Arcadio Knuckles, MD Taking Active Spouse/Significant Other  acetaminophen  (TYLENOL ) 325 MG tablet [id number] No Take 1-2 tablets (325-650 mg total) by mouth every 4 (four) hours as needed for mild pain (pain score 1-3). Zelda Hickman, PA-C Taking Active Spouse/Significant Other  AMBULATORY NON FORMULARY MEDICATION [id number] No Off-loading wheel chair seat cusion Dispense 1 Dx code: M53.3 Use as needed Corey, Evan S, MD Taking Active Spouse/Significant Other  amoxicillin  (AMOXIL ) 400 MG/5ML suspension [id number]  Take 12.5 mLs  (1,000 mg total) by mouth 3 (three) times daily. Jamesetta Mcbride, MD  Active   atorvastatin  (LIPITOR ) 80 MG tablet [id number] No TAKE 1 TABLET BY MOUTH EVERY DAY Arcadio Knuckles, MD Taking Active   Blood Glucose Monitoring Suppl DEVI [id number] No 1 each by Does not apply route in the morning, at noon, and at bedtime. May substitute to any manufacturer covered by patient's insurance. Zorita Hiss, NP Taking Active Spouse/Significant Other  ciprofloxacin  (CIPRO ) 750 MG tablet [id number]  Take 1 tablet (750 mg total) by mouth 2 (two) times daily. Cephas Collier T, MD  Active   collagenase  (SANTYL ) 250 UNIT/GM ointment [id number] No Apply 1 Application topically daily. Harden Leyden, PA-C Taking Active Spouse/Significant Other  dapagliflozin  propanediol (FARXIGA ) 10 MG TABS tablet [id number] No Take 1 tablet (10 mg total) by mouth daily. Eilleen Grates, MD Taking Active Spouse/Significant Other  fluticasone  (FLONASE ) 50 MCG/ACT nasal spray [id number] No SPRAY 2 SPRAYS INTO EACH NOSTRIL EVERY DAY Arcadio Knuckles, MD Taking Active   furosemide  (LASIX ) 40 MG tablet [id number] No Take 1 tablet (40 mg total) by mouth daily. Eilleen Grates, MD Taking Active   gabapentin  (NEURONTIN ) 300 MG capsule [id number] No Take 1 capsule (300 mg total) by mouth at bedtime. Colene Dauphin, MD Taking Active   gentian violet  (GNP GENTIAN VIOLET ) 1 % topical solution [id number] No Apply topically to affected area every 2-3 days as needed. May wash off in between applications. Wellington Half, FNP Taking Active Spouse/Significant Other  Glucose Blood (BLOOD GLUCOSE TEST STRIPS) STRP [id number] No 1 each by In Vitro route in the morning, at noon, and at bedtime. May substitute to any manufacturer covered by patient's insurance. Zorita Hiss, NP Taking Active Spouse/Significant Other  hydrOXYzine  (ATARAX ) 25 MG tablet [id number]  Take 1 tablet (25 mg total) by mouth 3 (three) times daily as needed. Arcadio Knuckles, MD  Active    ketoconazole  (NIZORAL ) 2 % cream [id number] No Apply 1 Application topically daily. Apply 1gm to skin on plantar aspect of affected foot, and massage in well.  Apply once daily. McCaughan, Dia D, DPM Taking Active Spouse/Significant Other           Med Note (MENDOZA MENDEZ, CARLOS A   Thu May 29, 2023  3:25 PM)    LORazepam  (ATIVAN ) 0.5 MG tablet [id number] No Take 1 tablet (0.5 mg total) by mouth 2 (two) times daily as needed for anxiety. Nwoko, Uchenna E, PA Taking Active Spouse/Significant Other  losartan  (COZAAR ) 25 MG tablet [id number] No Take 1 tablet (25 mg total) by mouth daily. Eilleen Grates, MD Taking Active   magnesium  oxide (MAG-OX) 400 MG tablet [id number] No Take 1 tablet (400 mg total) by mouth at bedtime. Eilleen Grates, MD Taking Active Spouse/Significant Other  metFORMIN  (GLUCOPHAGE -XR) 500 MG 24 hr tablet [id number] No Take 1 tablet (500 mg total) by mouth 2 (two) times daily with  a meal. Arcadio Knuckles, MD Taking Active Spouse/Significant Other  metoprolol  succinate (TOPROL -XL) 50 MG 24 hr tablet [id number] No Take 1 tablet (50 mg total) by mouth daily. Take with or immediately following a meal. Eilleen Grates, MD Taking Active Spouse/Significant Other  morphine  (MS CONTIN ) 15 MG 12 hr tablet [id number] No Take 1 tablet (15 mg total) by mouth every 12 (twelve) hours. Arcadio Knuckles, MD Taking Active   NON FORMULARY [id number] No Pt uses cpap nightly [provider] Taking Active Spouse/Significant Other  ondansetron  (ZOFRAN -ODT) 4 MG disintegrating tablet [id number] No Take 1 tablet (4 mg total) by mouth every 8 (eight) hours as needed for nausea or vomiting. Jamesetta Mcbride, MD Taking Active Spouse/Significant Other  polyethylene glycol powder (GLYCOLAX /MIRALAX ) 17 GM/SCOOP powder [id number] No Take 1 capful (17 g) in water 2 (two) times daily. Wellington Half, FNP Taking Active Spouse/Significant Other  potassium chloride  SA (KLOR-CON  M) 20 MEQ tablet [id number] No Take 1  tablet (20 mEq total) by mouth daily. Eilleen Grates, MD Taking Active Spouse/Significant Other  senna-docusate (SENOKOT-S) 8.6-50 MG tablet [id number] No Take 2 tablets by mouth at bedtime. Arcadio Knuckles, MD Taking Active Spouse/Significant Other  spironolactone  (ALDACTONE ) 25 MG tablet [id number] No Take 0.5 tablets (12.5 mg total) by mouth daily. Eilleen Grates, MD Taking Active Spouse/Significant Other  terconazole  (TERAZOL 3 ) 80 MG vaginal suppository [id number] No Place 1 suppository (80 mg total) vaginally at bedtime. Colene Dauphin, MD Taking Active   vortioxetine  HBr (TRINTELLIX ) 5 MG TABS tablet [id number] No Take 1 tablet (5 mg total) by mouth daily. Wellington Half, FNP Taking Active Spouse/Significant Other           Med Note [facility], CARLOS A   Thu May 29, 2023  3:26 PM)              Recommendation:   Client to attend scheduled medical appointments. Client to take medications as prescribed Client to attend appointments with mental health counselor as scheduled Client to use coping skills to manage depression symptoms Client to use cane or walker as needed for support in walking Client to communicate with spouse as needed regarding client medical appointments schedule Client to attend appointments with cardiologist as scheduled Client to call LCSW as needed for SW support  Follow Up Plan:   LCSW to call client on June 9,2025 at 3:00 PM    Alexandria Angel  MSW, LCSW [place]/[facility] Licensed Clinical Social Worker Direct Dial:  [phone] Fax:  [phone] Website:  [email or web address]

## 2023-06-17 NOTE — Telephone Encounter (Signed)
 Jullie Oiler, RN from [facility] called to inquire if switching from Xeroform to Collagen would be ok due to the wound not making as much progress as it has been. She stated she would still use the KY jelly, saline moistened gauze, abd pad, kerlix and gauze as normal. Adv that I would give her a call back once Dorris, [place] (provider who has been seeing patient) came out of a room with a patient. She conveyed understanding.   Call back # [phone]

## 2023-06-19 ENCOUNTER — Ambulatory Visit (INDEPENDENT_AMBULATORY_CARE_PROVIDER_SITE_OTHER): Admitting: Mental Health

## 2023-06-19 DIAGNOSIS — F332 Major depressive disorder, recurrent severe without psychotic features: Secondary | ICD-10-CM

## 2023-06-19 NOTE — Progress Notes (Unsigned)
   THERAPIST PROGRESS NOTE  Session Time: 2:02 pm (  57 minutes)  Participation Level: Active  Behavioral Response: CasualAlertDepressed, Dysphoric, and Irritable  Type of Therapy: Individual Therapy  Treatment Goals addressed: STG: "To cope." Dealie will increase management of moods AEB development of x3 effective coping skills with ability to process thoughts and feelings in balanced manner within the next 90 days.   ProgressTowards Goals: Not Progressing  Interventions: Supportive  Summary: [name] is a 58 y.o. female who presents with dx of major depression recurrent severe.  Presents for session alert and oriented; mood and affect irritable;agitated. In noticeable pain; slow gait with use of walker. Shares ongoing feelings of frustration, " I am not doing well. " Shares for husband to be her caretaker and to continue to experience high degree of pain. Shares feelings of snapping at husband due to presence of pain and irritabiilty and shares to feel guility for this. Shares to be waiting for pain management appointment and has been told she has to 'wait her turn'. Shares additional stress related to state of the country and current president. Shares concern for son who is on the Autism spectrum and his desire to study abroad. Shares concern for husband who is from [place]. Shares event of having to go to ED several times. Notes feelings of being angry and embarassed. Notes difficulty coping and always in high degree of pain as well as unable to enjoy foods as her palate has also been altered. Denies safety concerns.   Suicidal/Homicidal: Nowithout intent/plan  Therapist Response: Therapist engaged [name] in therapy session. Assessed for current level of functioning, sxs management and current stressors. Provided safe space to share thoughts and feelings in regards to current stressors. Active empathic listening; Provided support and encouragement; validated feelings. Supported in  processing frustrations with medical health and ongoing experienced of pain. Educated on online pain support groups. Reviewed session and provided follow up  Plan: Return again in  x 3 weeks.  Diagnosis: Major depressive disorder, recurrent episode, severe with anxious distress (HCC)  Collaboration of Care: Other None  Patient/Guardian was advised Release of Information must be obtained prior to any record release in order to collaborate their care with an outside provider. Patient/Guardian was advised if they have not already done so to contact the registration department to sign all necessary forms in order for us  to release information regarding their care.   Consent: Patient/Guardian gives verbal consent for treatment and assignment of benefits for services provided during this visit. Patient/Guardian expressed understanding and agreed to proceed.   Carmel Chimes [place], [facility] 06/23/2023

## 2023-06-20 DIAGNOSIS — F333 Major depressive disorder, recurrent, severe with psychotic symptoms: Secondary | ICD-10-CM | POA: Diagnosis not present

## 2023-06-20 DIAGNOSIS — T8149XA Infection following a procedure, other surgical site, initial encounter: Secondary | ICD-10-CM | POA: Diagnosis not present

## 2023-06-20 DIAGNOSIS — I152 Hypertension secondary to endocrine disorders: Secondary | ICD-10-CM | POA: Diagnosis not present

## 2023-06-20 DIAGNOSIS — B9689 Other specified bacterial agents as the cause of diseases classified elsewhere: Secondary | ICD-10-CM | POA: Diagnosis not present

## 2023-06-20 DIAGNOSIS — I5042 Chronic combined systolic (congestive) and diastolic (congestive) heart failure: Secondary | ICD-10-CM | POA: Diagnosis not present

## 2023-06-20 DIAGNOSIS — E1159 Type 2 diabetes mellitus with other circulatory complications: Secondary | ICD-10-CM | POA: Diagnosis not present

## 2023-06-24 ENCOUNTER — Telehealth: Payer: Self-pay

## 2023-06-24 ENCOUNTER — Ambulatory Visit (INDEPENDENT_AMBULATORY_CARE_PROVIDER_SITE_OTHER): Admitting: Student

## 2023-06-24 DIAGNOSIS — S81802D Unspecified open wound, left lower leg, subsequent encounter: Secondary | ICD-10-CM

## 2023-06-24 NOTE — Telephone Encounter (Signed)
 Faxed new wound care orders to [facility] from today's visit. Confirmation of receipt received.

## 2023-06-24 NOTE — Progress Notes (Signed)
 Referring Provider Arcadio Knuckles, MD [address] [place],  [place] [address]   CC: Follow-up     [name] is an 58 y.o. female.  HPI: Patient is a 58 year old female with a history of a large wound to her left lower extremity. She has underwent several debridements and is here for further follow-up of her wound.   Patient was last seen in the clinic on 06/10/2023.  At this visit, patient was doing okay overall.  On exam, wound to the left lower extremity appeared to have significantly improved.  It measured 11 cm x 12 cm at its longest/widest.  There is healthy granulation tissue throughout the majority of the wound.  There is improved epithelialization at the border.  Recommended that patient continue with wound care twice daily.  Today, patient reports with her husband at bedside.  She reports she is overall doing well.  She states that her left leg feels tight due to the scar tissue.  She states that she does have physical therapy coming out to her house once a week.  She denies any other issues or concerns at this time.  She states that she has been showering sometimes, but her husband cleans her wound daily with soap and water.  Review of Systems General: Does not report any fevers or chills  Physical Exam    06/17/2023    3:35 PM 06/09/2023    1:56 PM 05/29/2023    3:25 PM  Vitals with BMI  Height 5\' 4"   5\' 4"   Weight 234 lbs 239 lbs 249 lbs  BMI 40.15 41 42.72  Systolic -- 109 118  Diastolic -- 81 83  Pulse  66 97    General:  No acute distress,  Alert and oriented, Non-Toxic, Normal speech and affect On exam, patient is sitting upright in no acute distress.  The wound to the left lower extremity is approximately 11 x 12 cm at its widest and longest.  There appears to be improved granulation tissue throughout the wound.  There is still little bit of exudate noted over the tendon.  The epithelialized tissue at the borders appears to be somewhat firm, consistent with  scar tissue.  There are no active signs of infection on exam.  Assessment/Plan  Wound of left lower extremity, subsequent encounter   Recommended that we transition to collagen once a day.  Recommended that she apply Vashe soaked gauze to the wound for 5 to 10 minutes daily, then remove the gauze.  Then she may apply collagen, K-Y jelly Telfa, ABD pad and Ace and Kerlix.  Discussed with her and her husband that if the collagen is not yet incorporated, they can leave it in place and apply K-Y jelly and then the other dressings.  Discussed with him though that if it is fully incorporated they may place a new piece of collagen.  Patient and her husband expressed understanding.  Will also send orders to home health.  Discussed with patient that she should make sure she is massaging the scar tissue noted at the periphery of her wound, especially the superior aspect.  I discussed with her I like her to do this whenever she can.  I also discussed with her she can do some light range of motion with her leg to help with some of the tightness.  Patient expressed understanding.  Patient to follow back up in 2 to 3 weeks.  I instructed her to call if she has any questions or  concerns in the meantime.  Pictures were obtained of the patient and placed in the chart with the patient's or guardian's permission.   Harden Leyden 06/24/2023, 12:23 PM

## 2023-06-25 DIAGNOSIS — T8149XA Infection following a procedure, other surgical site, initial encounter: Secondary | ICD-10-CM | POA: Diagnosis not present

## 2023-06-25 DIAGNOSIS — F333 Major depressive disorder, recurrent, severe with psychotic symptoms: Secondary | ICD-10-CM | POA: Diagnosis not present

## 2023-06-25 DIAGNOSIS — I152 Hypertension secondary to endocrine disorders: Secondary | ICD-10-CM | POA: Diagnosis not present

## 2023-06-25 DIAGNOSIS — B9689 Other specified bacterial agents as the cause of diseases classified elsewhere: Secondary | ICD-10-CM | POA: Diagnosis not present

## 2023-06-25 DIAGNOSIS — I5042 Chronic combined systolic (congestive) and diastolic (congestive) heart failure: Secondary | ICD-10-CM | POA: Diagnosis not present

## 2023-06-25 DIAGNOSIS — E1159 Type 2 diabetes mellitus with other circulatory complications: Secondary | ICD-10-CM | POA: Diagnosis not present

## 2023-06-26 DIAGNOSIS — B9689 Other specified bacterial agents as the cause of diseases classified elsewhere: Secondary | ICD-10-CM | POA: Diagnosis not present

## 2023-06-26 DIAGNOSIS — I152 Hypertension secondary to endocrine disorders: Secondary | ICD-10-CM | POA: Diagnosis not present

## 2023-06-26 DIAGNOSIS — T8149XA Infection following a procedure, other surgical site, initial encounter: Secondary | ICD-10-CM | POA: Diagnosis not present

## 2023-06-26 DIAGNOSIS — E1159 Type 2 diabetes mellitus with other circulatory complications: Secondary | ICD-10-CM | POA: Diagnosis not present

## 2023-06-26 DIAGNOSIS — F333 Major depressive disorder, recurrent, severe with psychotic symptoms: Secondary | ICD-10-CM | POA: Diagnosis not present

## 2023-06-26 DIAGNOSIS — I5042 Chronic combined systolic (congestive) and diastolic (congestive) heart failure: Secondary | ICD-10-CM | POA: Diagnosis not present

## 2023-07-01 ENCOUNTER — Telehealth: Payer: Self-pay

## 2023-07-01 NOTE — Telephone Encounter (Signed)
 Patients husband called and stating regarding the wound dressing and home care.  Home care has not received any information regarding dressing for the patients wound.  Please f/u

## 2023-07-01 NOTE — Telephone Encounter (Signed)
 Re-faxed new wound care orders to [facility] with confirmed receipt. Fax: [phone]

## 2023-07-02 ENCOUNTER — Telehealth: Payer: Self-pay | Admitting: Internal Medicine

## 2023-07-02 DIAGNOSIS — Z6841 Body Mass Index (BMI) 40.0 and over, adult: Secondary | ICD-10-CM | POA: Diagnosis not present

## 2023-07-02 DIAGNOSIS — E1169 Type 2 diabetes mellitus with other specified complication: Secondary | ICD-10-CM | POA: Diagnosis not present

## 2023-07-02 DIAGNOSIS — I4901 Ventricular fibrillation: Secondary | ICD-10-CM | POA: Diagnosis not present

## 2023-07-02 DIAGNOSIS — T8149XA Infection following a procedure, other surgical site, initial encounter: Secondary | ICD-10-CM | POA: Diagnosis not present

## 2023-07-02 DIAGNOSIS — F333 Major depressive disorder, recurrent, severe with psychotic symptoms: Secondary | ICD-10-CM | POA: Diagnosis not present

## 2023-07-02 DIAGNOSIS — I080 Rheumatic disorders of both mitral and aortic valves: Secondary | ICD-10-CM | POA: Diagnosis not present

## 2023-07-02 DIAGNOSIS — I152 Hypertension secondary to endocrine disorders: Secondary | ICD-10-CM | POA: Diagnosis not present

## 2023-07-02 DIAGNOSIS — E785 Hyperlipidemia, unspecified: Secondary | ICD-10-CM | POA: Diagnosis not present

## 2023-07-02 DIAGNOSIS — G4733 Obstructive sleep apnea (adult) (pediatric): Secondary | ICD-10-CM | POA: Diagnosis not present

## 2023-07-02 DIAGNOSIS — M47816 Spondylosis without myelopathy or radiculopathy, lumbar region: Secondary | ICD-10-CM | POA: Diagnosis not present

## 2023-07-02 DIAGNOSIS — F988 Other specified behavioral and emotional disorders with onset usually occurring in childhood and adolescence: Secondary | ICD-10-CM | POA: Diagnosis not present

## 2023-07-02 DIAGNOSIS — M4800 Spinal stenosis, site unspecified: Secondary | ICD-10-CM | POA: Diagnosis not present

## 2023-07-02 DIAGNOSIS — G8929 Other chronic pain: Secondary | ICD-10-CM | POA: Diagnosis not present

## 2023-07-02 DIAGNOSIS — I252 Old myocardial infarction: Secondary | ICD-10-CM | POA: Diagnosis not present

## 2023-07-02 DIAGNOSIS — I5042 Chronic combined systolic (congestive) and diastolic (congestive) heart failure: Secondary | ICD-10-CM | POA: Diagnosis not present

## 2023-07-02 DIAGNOSIS — Z792 Long term (current) use of antibiotics: Secondary | ICD-10-CM | POA: Diagnosis not present

## 2023-07-02 DIAGNOSIS — M51369 Other intervertebral disc degeneration, lumbar region without mention of lumbar back pain or lower extremity pain: Secondary | ICD-10-CM | POA: Diagnosis not present

## 2023-07-02 DIAGNOSIS — B9689 Other specified bacterial agents as the cause of diseases classified elsewhere: Secondary | ICD-10-CM | POA: Diagnosis not present

## 2023-07-02 DIAGNOSIS — E669 Obesity, unspecified: Secondary | ICD-10-CM | POA: Diagnosis not present

## 2023-07-02 DIAGNOSIS — D539 Nutritional anemia, unspecified: Secondary | ICD-10-CM | POA: Diagnosis not present

## 2023-07-02 DIAGNOSIS — G5601 Carpal tunnel syndrome, right upper limb: Secondary | ICD-10-CM | POA: Diagnosis not present

## 2023-07-02 DIAGNOSIS — G43909 Migraine, unspecified, not intractable, without status migrainosus: Secondary | ICD-10-CM | POA: Diagnosis not present

## 2023-07-02 DIAGNOSIS — I428 Other cardiomyopathies: Secondary | ICD-10-CM | POA: Diagnosis not present

## 2023-07-02 DIAGNOSIS — M170 Bilateral primary osteoarthritis of knee: Secondary | ICD-10-CM | POA: Diagnosis not present

## 2023-07-02 DIAGNOSIS — E1159 Type 2 diabetes mellitus with other circulatory complications: Secondary | ICD-10-CM | POA: Diagnosis not present

## 2023-07-02 NOTE — Telephone Encounter (Unsigned)
 Copied from CRM [id number]. Topic: Clinical - Medical Advice >> Jul 02, 2023  4:16 PM Clyde Darling P wrote: Reason for CRM: Loetta Ringer - [facility] advise pt need a referral for pain management as she is having uncontrolled pain throughout body- state oxycodone /morphine  does not help.   Callback number: [id number]

## 2023-07-02 NOTE — Telephone Encounter (Signed)
 Thanks

## 2023-07-03 ENCOUNTER — Ambulatory Visit: Attending: Cardiology | Admitting: Cardiology

## 2023-07-03 ENCOUNTER — Encounter: Payer: Self-pay | Admitting: Cardiology

## 2023-07-03 VITALS — BP 100/70 | HR 79 | Ht 64.0 in | Wt 238.0 lb

## 2023-07-03 DIAGNOSIS — I4901 Ventricular fibrillation: Secondary | ICD-10-CM | POA: Diagnosis not present

## 2023-07-03 DIAGNOSIS — I469 Cardiac arrest, cause unspecified: Secondary | ICD-10-CM | POA: Diagnosis not present

## 2023-07-03 DIAGNOSIS — I5042 Chronic combined systolic (congestive) and diastolic (congestive) heart failure: Secondary | ICD-10-CM | POA: Diagnosis not present

## 2023-07-03 DIAGNOSIS — T8149XA Infection following a procedure, other surgical site, initial encounter: Secondary | ICD-10-CM | POA: Diagnosis not present

## 2023-07-03 DIAGNOSIS — I152 Hypertension secondary to endocrine disorders: Secondary | ICD-10-CM | POA: Diagnosis not present

## 2023-07-03 DIAGNOSIS — F333 Major depressive disorder, recurrent, severe with psychotic symptoms: Secondary | ICD-10-CM | POA: Diagnosis not present

## 2023-07-03 DIAGNOSIS — I428 Other cardiomyopathies: Secondary | ICD-10-CM | POA: Insufficient documentation

## 2023-07-03 DIAGNOSIS — I5022 Chronic systolic (congestive) heart failure: Secondary | ICD-10-CM | POA: Diagnosis not present

## 2023-07-03 DIAGNOSIS — E1159 Type 2 diabetes mellitus with other circulatory complications: Secondary | ICD-10-CM | POA: Diagnosis not present

## 2023-07-03 DIAGNOSIS — B9689 Other specified bacterial agents as the cause of diseases classified elsewhere: Secondary | ICD-10-CM | POA: Diagnosis not present

## 2023-07-03 NOTE — Progress Notes (Signed)
 Electrophysiology Office Note:   Date:  07/03/2023  ID:  [name], DOB 1965-04-12, MRN [id number]  Primary Cardiologist: Eilleen Grates, MD Primary Heart Failure: None Electrophysiologist: None      History of Present Illness:   [name] is a 58 y.o. female with h/o hypertension, chronic systolic heart failure due to nonischemic cardiomyopathy, OSA, CKD, diabetes, VF arrest seen today for routine electrophysiology followup.   She was admitted to the hospital 12/05/2022 with a witnessed cardiac arrest.  She had VF and had shocked x 6.  She had ACLS for 40 minutes.  She was started on IV amiodarone .  Left heart catheterization showed normal coronary arteries.  She had a complex lower extremity wound after intraosseous IV infiltration.  She developed a full-thickness wound.  She was seen by plastic surgery and ICD was not placed due to the wound.  She was discharged with a LifeVest.  Since last being seen in our clinic the patient reports she has been recovering from her leg wound.  She is having some orthopedic issues caused by having to compensate as she is having quite a bit of discomfort at times.  She remains on antibiotics for the wound.  She is somewhat frustrated that she has not been referred to wound care.  she denies chest pain, palpitations, dyspnea, PND, orthopnea, nausea, vomiting, dizziness, syncope, edema, weight gain, or early satiety.   Review of systems complete and found to be negative unless listed in HPI.   EP Information / Studies Reviewed:    EKG is ordered today. Personal review as below.  EKG Interpretation Date/Time:  Thursday Jul 03 2023 15:42:33 EDT Ventricular Rate:  79 PR Interval:  176 QRS Duration:  98 QT Interval:  420 QTC Calculation: 481 R Axis:   -34  Text Interpretation: Sinus rhythm with occasional Premature ventricular complexes Left axis deviation When compared with ECG of 14-Jan-2023 14:25, Premature ventricular complexes are now Present  Confirmed by Jacquelynne Guedes ([id number]) on 07/03/2023 3:50:28 PM     Risk Assessment/Calculations:             Physical Exam:   VS:  BP 100/70 (BP Location: Right Arm, Patient Position: Sitting, Cuff Size: Large)   Pulse 79   Ht 5\' 4"  (1.626 m)   Wt 238 lb (108 kg)   LMP  (LMP Unknown)   SpO2 97%   BMI 40.85 kg/m    Wt Readings from Last 3 Encounters:  07/03/23 238 lb (108 kg)  06/17/23 234 lb (106.1 kg)  06/09/23 239 lb (108.4 kg)     GEN: Well nourished, well developed in no acute distress NECK: No JVD; No carotid bruits CARDIAC: Regular rate and rhythm, no murmurs, rubs, gallops RESPIRATORY:  Clear to auscultation without rales, wheezing or rhonchi  ABDOMEN: Soft, non-tender, non-distended EXTREMITIES:  No edema; No deformity   ASSESSMENT AND PLAN:    1.  Out-of-hospital cardiac arrest: Due to ventricular fibrillation.  Was discharged with a LifeVest.  She had a wound infection for minutes at infiltrated intraosseous IV.  She still has a wound on her leg.  She is on antibiotics.  She Sanaai Doane need an ICD.  We did discuss risks and benefits of ICD implant.  I Masayuki Sakai contact her plastic surgeon as well as her infectious disease physician about possible timing of a transvenous implant.  2.  Chronic systolic heart failure: On optimal medical therapy per primary cardiology.  3.  Obstructive sleep apnea: CPAP compliance encouraged  Follow up with  Dr. Lawana Pray pending discussion with ID and plastic surgery   Signed, Jawad Wiacek Cortland Ding, MD

## 2023-07-04 NOTE — Telephone Encounter (Signed)
 I spoke with the patient's husband and answered all of his questions to his satisfaction

## 2023-07-08 ENCOUNTER — Ambulatory Visit: Admitting: Student

## 2023-07-08 VITALS — HR 84

## 2023-07-08 DIAGNOSIS — S81802D Unspecified open wound, left lower leg, subsequent encounter: Secondary | ICD-10-CM | POA: Diagnosis not present

## 2023-07-08 NOTE — Progress Notes (Addendum)
 Referring Provider Arcadio Knuckles, MD [address] [place],  [place] [address]   CC:  Chief Complaint  Patient presents with   Follow-up      [name] is an 58 y.o. female.  HPI: Patient is a 58 year old female with a history of a large wound to her left lower extremity. She has undergone several debridements and is here for further follow-up of her wound.   Patient was last seen in the clinic on 06/24/2023.  At this visit, patient was doing well.  She did state that she felt her leg was tight due to the scar tissue.  On exam, the wound was 11 x 12 cm that its widest and longest.  There appeared to be improved granulation tissue throughout the wound.  Recommended that patient transition to collagen once daily.  Today, patient presents with her husband at bedside.  She reports that she tried collagen over the weekend, but reports it was expired.  Her husband reports it was Medline collagen.  She states that after she tried the collagen, she felt like she had increased pain in her wound.  She states that she switched back to her original wound care routine.  She states that the pain has improved a little bit since switching back to the original wound care routine.  Patient reports that she is still having pain on that left side.  She states that she thought she had an appointment with pain management, but reports now she does not.  Patient also states that she cannot have implantable device placed by cardiology due to the wound and the infection she had in her wound.  Patient reports that she has an appointment with infectious disease next week.  Review of Systems General: Does not report any fevers or chills  Physical Exam    07/08/2023    2:57 PM 07/03/2023    3:36 PM 06/17/2023    3:35 PM  Vitals with BMI  Height  5\' 4"  5\' 4"   Weight  238 lbs 234 lbs  BMI  40.83 40.15  Systolic -- 100 --  Diastolic -- 70 --  Pulse 84 79     General:  No acute distress,  Alert and oriented,  Non-Toxic, Normal speech and affect Chaperone present on exam.  On exam, patient is sitting upright in no acute distress.  Wound to the left lower extremity is approximately 11 x 13 cm.  There appears to be granulation tissue throughout the wound with some mild exudate.  There is no active drainage on exam.  No signs of infection.  Assessment/Plan  Wound of left lower extremity, subsequent encounter   Discussed with patient and husband different options for wound care going forward.  We discussed continuing with previous regimen including cleaning with Vashe soaked gauze, and applying Adaptic, K-Y jelly, Vashe soaked gauze, ABD pad and Ace wrap and Kerlix.  We also discussed trying a different collagen brand to see if that might work for the patient.  Patient and her husband were interested in trying a different brand.  We will reach out to the home health care company to see if they carry ColActive Plus Ag collagen sheets.  Endoform is another option we could consider if home health company carries this product.  Encouraged patient and her husband to try to get in with pain management.  Referral was sent again from our office and per front desk staff, [facility] will be giving them a call to  have the patient scheduled.  Encouraged the patient to continue to massage the surrounding tissue around her wound and to continue with physical therapy.  Discussed with patient to follow-up with ID and then follow-up with Dr. Carolynne Citron in 2 weeks.  I instructed them to call in the meantime if they have any questions or concerns about anything.  Pictures were obtained of the patient and placed in the chart with the patient's or guardian's permission.   ADDEND: We reached out to the [facility] company, our staff spoke with Shelvy Dickens. She states that they do not have ColActive Plus Ag collagen sheets or Endoform. Will plan on patient continuing with previous wound care routine  [name] 07/08/2023, 3:12 PM

## 2023-07-09 ENCOUNTER — Ambulatory Visit: Admitting: Family Medicine

## 2023-07-09 ENCOUNTER — Encounter: Payer: Self-pay | Admitting: Family Medicine

## 2023-07-09 ENCOUNTER — Telehealth: Payer: Self-pay

## 2023-07-09 ENCOUNTER — Ambulatory Visit (INDEPENDENT_AMBULATORY_CARE_PROVIDER_SITE_OTHER): Admitting: Mental Health

## 2023-07-09 ENCOUNTER — Ambulatory Visit: Payer: Self-pay

## 2023-07-09 VITALS — Ht 64.0 in

## 2023-07-09 DIAGNOSIS — G8929 Other chronic pain: Secondary | ICD-10-CM

## 2023-07-09 DIAGNOSIS — F332 Major depressive disorder, recurrent severe without psychotic features: Secondary | ICD-10-CM

## 2023-07-09 DIAGNOSIS — M25561 Pain in right knee: Secondary | ICD-10-CM | POA: Diagnosis not present

## 2023-07-09 DIAGNOSIS — M25512 Pain in left shoulder: Secondary | ICD-10-CM

## 2023-07-09 DIAGNOSIS — F419 Anxiety disorder, unspecified: Secondary | ICD-10-CM

## 2023-07-09 NOTE — Patient Instructions (Addendum)
 Thank you for coming in today.   You received an injection today. Seek immediate medical attention if the joint becomes red, extremely painful, or is oozing fluid.   [email or web address]

## 2023-07-09 NOTE — Telephone Encounter (Addendum)
 Faxed updated wound care orders to [facility], RN yesterday, 5/13 to discontinue collagen and resume using Adaptic and [place] jelly. Received fax success confirmation. Forwarded both documents to the front desk for batch scanning.

## 2023-07-09 NOTE — Progress Notes (Deleted)
   Joanna Muck, PhD, LAT, ATC acting as a scribe for Garlan Juniper, MD.  [name] is a 58 y.o. female who presents to [facility] today for bilat shoulder pain. Pt was last seen by Dr. Alease Hunter on 03/24/34 for LBP. Last L subacromial steroid injection, 03/04/23.  Today, pt c/o bilat shoulder pain, R>L x ***.   Dx testing: 03/04/23 L shoulder XR  12/20/22 R shoulder XR  Pertinent review of systems: ***  Relevant historical information: ***   Exam:  LMP  (LMP Unknown)  General: Well Developed, well nourished, and in no acute distress.   MSK: ***    Lab and Radiology Results No results found. However, due to the size of the patient record, not all encounters were searched. Please check Results Review for a complete set of results. No results found.     Assessment and Plan: 58 y.o. female with ***   PDMP not reviewed this encounter. No orders of the defined types were placed in this encounter.  No orders of the defined types were placed in this encounter.    Discussed warning signs or symptoms. Please see discharge instructions. Patient expresses understanding.   ***

## 2023-07-09 NOTE — Progress Notes (Unsigned)
   THERAPIST PROGRESS NOTE  Session Time: 2:04 PM ( 57 minutes)  Participation Level: Active  Behavioral Response: CasualAlertDysphoric and Irritable  Type of Therapy: Individual Therapy  Treatment Goals addressed:  STG: "To cope." Laurelle will increase management of moods AEB development of x3 effective coping skills with ability to process thoughts and feelings in balanced manner within the next 90 days.   ProgressTowards Goals: Progressing  Interventions: CBT and Supportive  Summary: [name] is a 58 y.o. female who presents with dx of major depression recurrent severe.  Presents for session alert and oriented; mood and affect irritable;agitated. In noticeable pain; slow gait with use of walker. Shares ongoing feelings of frustration, " I am not doing well. " Shares for husband to be her caretaker and to continue to experience high degree of pain. Shares    Suicidal/Homicidal: Nowithout intent/plan  Therapist Response: Therapist engaged Eleonore in therapy session. Assessed for current level of functioning, sxs management and current stressors. Provided safe space to share thoughts and feelings in regards to current stressors. Active empathic listening; Provided support and   Plan: Return again in  x 2 weeks.  Diagnosis: Major depressive disorder, recurrent episode, severe with anxious distress (HCC)  Anxiety  Collaboration of Care: Other None  Patient/Guardian was advised Release of Information must be obtained prior to any record release in order to collaborate their care with an outside provider. Patient/Guardian was advised if they have not already done so to contact the registration department to sign all necessary forms in order for us  to release information regarding their care.   Consent: Patient/Guardian gives verbal consent for treatment and assignment of benefits for services provided during this visit. Patient/Guardian expressed understanding and agreed to proceed.    Carmel Chimes [place], [facility] 07/09/2023

## 2023-07-09 NOTE — Progress Notes (Signed)
 I, Miquel Amen, CMA acting as a scribe for Garlan Juniper, MD.  [name] is a 59 y.o. female who presents to [facility] at [facility] today for bilat shoulder pain. Pt was last seen by Dr. Alease Hunter on 03/24/34 for LBP. Last L subacromial steroid injection, 03/04/23.  Today, pt c/o bilat shoulder pain, R>L x and right knee pain.  Pain is worse with activity.  Since her last visit a lot has happened.  Her leg wound which was at risk for not healing and required amputation is slowly healing with the efforts of wound care.  She continues to experience pain in her knee and shoulder with motion.  She is able to move more however..   Dx testing: 03/04/23 L shoulder XR  12/20/22 R shoulder XR  Pertinent review of systems: No fevers or chills.  Relevant historical information: Controlled diabetes and hypertension.  History of sudden cardiac collapse requiring resuscitation.   Exam:  Ht 5\' 4"  (1.626 m)   LMP  (LMP Unknown)   BMI 40.85 kg/m  General: Well Developed, well nourished, and in no acute distress.   MSK: Left shoulder normal appearing Limited range of motion abduction limited to 100 degrees. Strength is intact Positive Hawkins and Neer's test.  Right knee mild effusion normal motion with crepitation.  Intact strength.    Lab and Radiology Results  Procedure: Real-time Ultrasound Guided Injection of right knee joint superior lateral patella space Device: Philips Affiniti 50G/GE Logiq Images permanently stored and available for review in PACS Verbal informed consent obtained.  Discussed risks and benefits of procedure. Warned about infection, bleeding, hyperglycemia damage to structures among others. Patient expresses understanding and agreement Time-out conducted.   Noted no overlying erythema, induration, or other signs of local infection.   Skin prepped in a sterile fashion.   Local anesthesia: Topical Ethyl chloride.   With sterile technique and under  real time ultrasound guidance: 40 mg of Kenalog and 2 mL of Marcaine  injected into knee joint. Fluid seen entering the joint capsule.   Completed without difficulty   Pain immediately resolved suggesting accurate placement of the medication.   Advised to call if fevers/chills, erythema, induration, drainage, or persistent bleeding.   Images permanently stored and available for review in the ultrasound unit.  Impression: Technically successful ultrasound guided injection.   Procedure: Real-time Ultrasound Guided Injection of left shoulder subacromial bursa Device: Philips Affiniti 50G/GE Logiq Images permanently stored and available for review in PACS Verbal informed consent obtained.  Discussed risks and benefits of procedure. Warned about infection, bleeding, hyperglycemia damage to structures among others. Patient expresses understanding and agreement Time-out conducted.   Noted no overlying erythema, induration, or other signs of local infection.   Skin prepped in a sterile fashion.   Local anesthesia: Topical Ethyl chloride.   With sterile technique and under real time ultrasound guidance: 40 mg of Kenalog and 2 ml of Marcaine  injected into subacromial bursa. Fluid seen entering the bursa.   Completed without difficulty   Pain immediately resolved suggesting accurate placement of the medication.   Advised to call if fevers/chills, erythema, induration, drainage, or persistent bleeding.   Images permanently stored and available for review in the ultrasound unit.  Impression: Technically successful ultrasound guided injection.         Assessment and Plan: 58 y.o. female with chronic left shoulder and right knee pain.  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  Plan  for subacromial injection and right knee injection today.  Continue to work with home exercise program.  Check back as needed.   PDMP not reviewed this encounter. Orders  Placed This Encounter  Procedures   US  LIMITED JOINT SPACE STRUCTURES UP BILAT(NO LINKED CHARGES)    Reason for Exam (SYMPTOM  OR DIAGNOSIS REQUIRED):   bilat shoulder pain    Preferred imaging location?:   [facility]   No orders of the defined types were placed in this encounter.    Discussed warning signs or symptoms. Please see discharge instructions. Patient expresses understanding.   The above documentation has been reviewed and is accurate and complete Garlan Juniper, M.D.

## 2023-07-10 DIAGNOSIS — I5042 Chronic combined systolic (congestive) and diastolic (congestive) heart failure: Secondary | ICD-10-CM | POA: Diagnosis not present

## 2023-07-10 DIAGNOSIS — F333 Major depressive disorder, recurrent, severe with psychotic symptoms: Secondary | ICD-10-CM | POA: Diagnosis not present

## 2023-07-10 DIAGNOSIS — E1159 Type 2 diabetes mellitus with other circulatory complications: Secondary | ICD-10-CM | POA: Diagnosis not present

## 2023-07-10 DIAGNOSIS — I152 Hypertension secondary to endocrine disorders: Secondary | ICD-10-CM | POA: Diagnosis not present

## 2023-07-10 DIAGNOSIS — B9689 Other specified bacterial agents as the cause of diseases classified elsewhere: Secondary | ICD-10-CM | POA: Diagnosis not present

## 2023-07-10 DIAGNOSIS — T8149XA Infection following a procedure, other surgical site, initial encounter: Secondary | ICD-10-CM | POA: Diagnosis not present

## 2023-07-13 NOTE — Progress Notes (Signed)
 Assessment/Plan:   Memory Impairment of unclear etiology, likely multifactorial  [name] is a very pleasant 58 y.o. RH female with a history of hypertension, hyperlipidemia, NICM, CHF, status post cardiac arrest /VF with 40 minutes of CPR, OSA unable to tolerate CPAP, CKD, DM2, ADD, anxiety, major depression, anemia, chronic LL ED with status post debridement in December 2024, chronic pain presenting today in follow-up for evaluation of memory loss. Patient is currently not on anti-dementia medications at this time.Discussed with the patient importance of performing MRI of the brain to look for any structural abnormalities on to further evaluate for vascular load as well as the need for a neuropsych evaluation for diagnostic clarity, agrees to proceed .     Recommendations:   Follow up in 6  months. Recommend good control of cardiovascular risk factors Neuropsych evaluation for diagnostic clarity Replenish B12 Recommend to proceed with MRI brain prior to future ICD implant to rule out any structural abnormalities and to further evaluate vascular load.  Continue to control mood as per [facility]    Subjective:   This patient is accompanied in the office by her husband who supplements the history. Previous records as well as any outside records available were reviewed prior to todays visit.  Patient was last seen on 03/14/23 with [facility] 20/30      Any changes in memory since last visit? "I feel it is bad".   Patient continues to report difficulty with conversations, new information, names. Since the cardiac arrest I feel that my STM is absent. I cannot find words". This is very overwhelming to her.  She is also concerned that she has ADD and possibly "Tourette's"-she says. repeats oneself?  Endorsed Disoriented when walking into a room?  Patient denies    Misplacing objects?  Patient denies   Wandering behavior?   denies   Any personality changes since last visit?  I am overwhelmed right  now. My priority is treating my pain, she has an appt with pain clinic soon.   Any worsening depression?  Endorsed, she has recently found a new therapist, last visit 07/09/2023. She states that medication that she can use for depression are not compatible with other current medications, for which her mood is not well controlled at this time.She becomes tearful when talking about her current emotional status. Hallucinations or paranoia?  denies   Seizures?   denies    Any sleep changes?  Does not sleep very well,.  She always had vivid dreams, denies REM behavior or sleepwalking    Sleep apnea?  Endorsed, not very compliant with her CPAP.  Any hygiene concerns?   Needs assistance by her husband.  Does the patient needs help with medications?  Husband is in charge  Who is in charge of the finances?  Husband is in charge    Any changes in appetite?  Decreased.  My life has been upside down since Oct 10.      Patient have trouble swallowing?  denies   Does the patient cook?  Any kitchen accidents such as leaving the stove on?   Denies.   Any headaches?    Denies.   Vision changes? denies Chronic pain?  History of chronic low back pain, on steroid injections, last 02/2023 . She is to be seen at the pain clinic soon for therapeutic options, better pain control.  Ambulates with difficulty?,  Uses a scooter when she goes out, at home she uses a walker or a cane to prevent a  fall.  "She lives on the couch"-husband says Recent falls or head injuries?    denies     Stroke like symptoms?   denies   Any tremors?  denies   Any anosmia?    denies   Any incontinence of urine?  Endorsed Any bowel dysfunction?  She has issues with constipation Patient lives with her husband, she has home health  Does the patient drive?  No longer drives due to mobility issues   Initial visit 03/14/2023 How long did patient have memory difficulties?  " Absolutely , since I know I have ADD!, maybe worse since the last  hospitalization".  Reports some difficulty remembering new information, conversations and names.  Long-term memory is good.  She is not very active "I seat all the time, I am on my chair all day ".  repeats oneself?  Endorsed Disoriented when walking into a room?  Patient denies  Leaving objects in unusual places? Denies.  Wandering behavior?  Denies.  Any personality changes?  Denies.   Any history of depression?:  Denies. " A lot more depressed"."Very angry , it is too much!".  "I feel very overwhelmed".  "Not necessarily suicidal but my life su*&s, I am losing care". She has a therapist  who has cancelled her a few times.  She is looking for new BH.  Hallucinations or paranoia?  Denies   Seizures?  Denies    Any sleep changes?   Does not sleep well, Reports vivid dreams"always did", denies REM behavior or sleepwalking   Sleep apnea? Endorsed, does not use a CPAP. "I know I should but I can't put a mask on my face now" Any hygiene concerns?  Needs assistance, "my husband has to help me for everything ".    Independent of bathing and dressing?  Endorsed  Does the patient needs help with medications? Husband is in charge   Who is in charge of the finances? Husband is in charge     Any changes in appetite?  Since hospitalization, smells bother me. I have a hard time eating, lost 50 lbs since October.     Patient have trouble swallowing? Denies.   Does the patient cook? No.  Any kitchen accidents such as leaving the stove on? Denies.   Any history of headaches? Remote, during fertile years. Not frequently.  Chronic pain ?Yes, takes oxycodone , and other pain meds.  Ambulates with difficulty?  Uses a walker and a cane at home, needs assistance to prevent falls. She also has a scooter when going to the stores Recent falls or head injuries? No head injuries.  Vision changes? Denies.   Unilateral weakness, numbness or tingling? Denies.   Any tremors?   Denies.   Any anosmia?  "Smells make me  nauseous" Any incontinence of urine? Over the last few years.  Any bowel dysfunction? Constipation.  Patient lives with her husband.    History of heavy alcohol intake? Denies.   History of heavy tobacco use? Denies.   Family history of dementia? Denies.  Does patient drive? Until October, cannot due to mobility issues   TSH 4.29, B12 357, B1 35   CT of the head on 12/05/2022 with questionable area of low density in the right frontal lobe questionable artifactual, gray-white differentiation preserved, no acute findings.  Frontal hyperostosis noted.  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/2023   ADD (attention deficit disorder)    Anxiety    Arthritis    both knees  right worse than left   Carpal tunnel syndrome of right wrist    Chronic back pain    Dysrhythmia    history of Vtach   Enterococcus faecalis infection 04/29/2023   Essential hypertension, benign    Gallstones    History of smoking 06/22/2016   Hyperlipidemia associated with type 2 diabetes mellitus (HCC), on Zocor  04/04/2011   Hypertension associated with diabetes (HCC) 06/03/2008   MDD (major depressive disorder)    Memory impairment    post cardiac arrest   Migraines    Morbid obesity (HCC)    Morbid obesity with BMI of 50.0-59.9, adult (HCC) 07/12/2006   NICM (nonischemic cardiomyopathy) (HCC) 07/12/2006   01/16/18 ECHO:    - Procedure narrative: Transthoracic echocardiography. Image   quality was suboptimal. The study was technically difficult.   Intravenous contrast (Definity ) was administered. - Left ventricle: The cavity size was moderately dilated. Wall   thickness was increased in a pattern of mild LVH. Systolic   function was moderately to severely reduced. The estimated   ejection fraction w   Normal coronary arteries 04/17/2016   OSA on CPAP 10/28/2014   does not use CPAP- pt cannot tolerate CPAP   Pseudomonas infection 04/29/2023   Spinal stenosis    back pain    Spondylosis without myelopathy or radiculopathy, lumbar region 12/04/2017   Type 2 diabetes mellitus with hyperglycemia (HCC)    Walker as ambulation aid      Past Surgical History:  Procedure Laterality Date   CHOLECYSTECTOMY     DILATATION & CURETTAGE/HYSTEROSCOPY WITH MYOSURE N/A 10/31/2017   Procedure: DILATATION & CURETTAGE/HYSTEROSCOPY WITH MYOSURE;  Surgeon: Wanita Gutta, MD;  Location: [facility] ORS;  Service: Gynecology;  Laterality: N/A;   HYSTEROSCOPY WITH D & C N/A 05/07/2022   Procedure: DILATATION AND CURETTAGE /HYSTEROSCOPY;  Surgeon: Kiki Pelton, MD;  Location: [facility] OR;  Service: Gynecology;  Laterality: N/A;   INCISION AND DRAINAGE OF WOUND Left 02/06/2023   Procedure: debridement of left leg wound, application of myriad;  Surgeon: Teretha Ferguson, MD;  Location: [facility] OR;  Service: Plastics;  Laterality: Left;   INCISION AND DRAINAGE OF WOUND Left 02/24/2023   Procedure: application of tissue replacement matrix of left patellar tendon;  Surgeon: Teretha Ferguson, MD;  Location: [facility] OR;  Service: Plastics;  Laterality: Left;   INCISION AND DRAINAGE OF WOUND Left 04/15/2023   Procedure: irrigation and debridement to left lower extremity wound;  Surgeon: Teretha Ferguson, MD;  Location: [facility] OR;  Service: Plastics;  Laterality: Left;   IRRIGATION AND DEBRIDEMENT KNEE  02/24/2023   Procedure: IRRIGATION AND DEBRIDEMENT KNEE;  Surgeon: Teretha Ferguson, MD;  Location: [facility] OR;  Service: Plastics;;   life vest     RIGHT/LEFT HEART CATH AND CORONARY ANGIOGRAPHY N/A 04/11/2016   Procedure: Right/Left Heart Cath and Coronary Angiography;  Surgeon: Odie Benne, MD;  Location: [facility] INVASIVE CV LAB;  Service: Cardiovascular;  Laterality: N/A;   RIGHT/LEFT HEART CATH AND CORONARY ANGIOGRAPHY N/A 12/09/2022   Procedure: RIGHT/LEFT HEART CATH AND CORONARY ANGIOGRAPHY;  Surgeon: Knox Perl, MD;  Location: [facility] INVASIVE CV LAB;  Service: Cardiovascular;  Laterality: N/A;   sonogram  for blood clots     no blockages     PREVIOUS MEDICATIONS:   CURRENT MEDICATIONS:  Outpatient Encounter Medications as of 07/14/2023  Medication Sig   Accu-Chek Softclix Lancets lancets Use as instructed   acetaminophen  (TYLENOL ) 325 MG tablet Take 1-2 tablets (325-650 mg total) by mouth  every 4 (four) hours as needed for mild pain (pain score 1-3).   AMBULATORY NON FORMULARY MEDICATION Off-loading wheel chair seat cusion Dispense 1 Dx code: M53.3 Use as needed   amoxicillin  (AMOXIL ) 400 MG/5ML suspension Take 12.5 mLs (1,000 mg total) by mouth 3 (three) times daily.   atorvastatin  (LIPITOR ) 80 MG tablet TAKE 1 TABLET BY MOUTH EVERY DAY   Blood Glucose Monitoring Suppl DEVI 1 each by Does not apply route in the morning, at noon, and at bedtime. May substitute to any manufacturer covered by patient's insurance.   ciprofloxacin  (CIPRO ) 750 MG tablet Take 1 tablet (750 mg total) by mouth 2 (two) times daily.   collagenase  (SANTYL ) 250 UNIT/GM ointment Apply 1 Application topically daily.   dapagliflozin  propanediol (FARXIGA ) 10 MG TABS tablet Take 1 tablet (10 mg total) by mouth daily.   fluticasone  (FLONASE ) 50 MCG/ACT nasal spray SPRAY 2 SPRAYS INTO EACH NOSTRIL EVERY DAY   furosemide  (LASIX ) 40 MG tablet Take 1 tablet (40 mg total) by mouth daily.   gabapentin  (NEURONTIN ) 300 MG capsule Take 1 capsule (300 mg total) by mouth at bedtime.   gentian violet  (GNP GENTIAN VIOLET ) 1 % topical solution Apply topically to affected area every 2-3 days as needed. May wash off in between applications.   Glucose Blood (BLOOD GLUCOSE TEST STRIPS) STRP 1 each by In Vitro route in the morning, at noon, and at bedtime. May substitute to any manufacturer covered by patient's insurance.   hydrOXYzine  (ATARAX ) 25 MG tablet Take 1 tablet (25 mg total) by mouth 3 (three) times daily as needed.   ketoconazole  (NIZORAL ) 2 % cream Apply 1 Application topically daily. Apply 1gm to skin on plantar aspect of affected  foot, and massage in well.  Apply once daily.   LORazepam  (ATIVAN ) 0.5 MG tablet Take 1 tablet (0.5 mg total) by mouth 2 (two) times daily as needed for anxiety.   losartan  (COZAAR ) 25 MG tablet Take 1 tablet (25 mg total) by mouth daily.   magnesium  oxide (MAG-OX) 400 MG tablet Take 1 tablet (400 mg total) by mouth at bedtime.   metFORMIN  (GLUCOPHAGE -XR) 500 MG 24 hr tablet Take 1 tablet (500 mg total) by mouth 2 (two) times daily with a meal.   metoprolol  succinate (TOPROL -XL) 50 MG 24 hr tablet Take 1 tablet (50 mg total) by mouth daily. Take with or immediately following a meal.   morphine  (MS CONTIN ) 15 MG 12 hr tablet Take 1 tablet (15 mg total) by mouth every 12 (twelve) hours.   NON FORMULARY Pt uses cpap nightly   ondansetron  (ZOFRAN -ODT) 4 MG disintegrating tablet Take 1 tablet (4 mg total) by mouth every 8 (eight) hours as needed for nausea or vomiting.   polyethylene glycol powder (GLYCOLAX /MIRALAX ) 17 GM/SCOOP powder Take 1 capful (17 g) in water 2 (two) times daily.   potassium chloride  SA (KLOR-CON  M) 20 MEQ tablet Take 1 tablet (20 mEq total) by mouth daily.   senna-docusate (SENOKOT-S) 8.6-50 MG tablet Take 2 tablets by mouth at bedtime.   spironolactone  (ALDACTONE ) 25 MG tablet Take 0.5 tablets (12.5 mg total) by mouth daily.   terconazole  (TERAZOL 3 ) 80 MG vaginal suppository Place 1 suppository (80 mg total) vaginally at bedtime.   vortioxetine  HBr (TRINTELLIX ) 5 MG TABS tablet Take 1 tablet (5 mg total) by mouth daily.   No facility-administered encounter medications on file as of 07/14/2023.     Objective:     PHYSICAL EXAMINATION:    VITALS:  Vitals:   07/14/23 1500  BP: (!) 140/80  Pulse: 80  Resp: 20  SpO2: 96%  Weight: 238 lb (108 kg)    GEN:  The patient appears stated age and is in NAD. HEENT:  Normocephalic, atraumatic.   Neurological examination:  General: NAD, well-groomed, appears stated age. Orientation: The patient is alert. Oriented to  person, place and date Cranial nerves: There is good facial symmetry. Tearful and anxious appearing. The speech is fluent and clear. No aphasia or dysarthria. Fund of knowledge is appropriate. Recent memory impaired and remote memory is normal.  Attention and concentration are decreased.  Able to name objects and repeat phrases.  Hearing is intact to conversational tone. Sensation: Sensation is intact to light touch throughout Motor: Strength is at least antigravity x4. DTR's 2/4 in UE/LE      03/14/2023    2:00 PM  Montreal Cognitive Assessment   Visuospatial/ Executive (0/5) 5  Naming (0/3) 3  Attention: Read list of digits (0/2) 1  Attention: Read list of letters (0/1) 1  Attention: Serial 7 subtraction starting at 100 (0/3) 1  Language: Repeat phrase (0/2) 1  Language : Fluency (0/1) 0  Abstraction (0/2) 0  Delayed Recall (0/5) 3  Orientation (0/6) 5  Total 20  Adjusted Score (based on education) 20       07/14/2023    4:00 PM  MMSE - Mini Mental State Exam  Orientation to time 4  Orientation to Place 5  Registration 3  Attention/ Calculation 5  Recall 2  Language- name 2 objects 2  Language- repeat 1  Language- follow 3 step command 3  Language- read & follow direction 1  Write a sentence 1  Copy design 1  Total score 28       Movement examination: Tone: There is normal tone in the UE/LE Abnormal movements:  no tremor.  No myoclonus.  No asterixis.   Coordination:  There is no decremation with RAM's. Normal FTN Gait and Station: The patient has  difficulty arising out of a deep-seated chair without the use of the hands. The patient's stride length is short, needs a walker, Gait is cautious and narrow.   Thank you for allowing us  the opportunity to participate in the care of this nice patient. Please do not hesitate to contact us  for any questions or concerns.   Total time spent on today's visit was 34 minutes dedicated to this patient today, preparing to see  patient, examining the patient, ordering tests and/or medications and counseling the patient, documenting clinical information in the EHR or other health record, independently interpreting results and communicating results to the patient/family, discussing treatment and goals, answering patient's questions and coordinating care.  Cc:  Arcadio Knuckles, MD  Tex Filbert 07/14/2023 4:49 PM

## 2023-07-14 ENCOUNTER — Encounter: Payer: Self-pay | Admitting: Physician Assistant

## 2023-07-14 ENCOUNTER — Ambulatory Visit (INDEPENDENT_AMBULATORY_CARE_PROVIDER_SITE_OTHER): Payer: Medicare Other | Admitting: Physician Assistant

## 2023-07-14 ENCOUNTER — Ambulatory Visit: Admitting: Family Medicine

## 2023-07-14 VITALS — BP 140/80 | HR 80 | Resp 20 | Wt 238.0 lb

## 2023-07-14 DIAGNOSIS — R413 Other amnesia: Secondary | ICD-10-CM | POA: Diagnosis not present

## 2023-07-14 NOTE — Patient Instructions (Addendum)
 It was a pleasure to see you today at our office.   Recommendations:   MRI of the brain, the radiology office will call you to arrange you appointment  [phone] Replenish B12  Follow up in 6 months Continue the CPAP Neuropsych evaluation     I would recommend psychiatry.  You don't need a referral.  You can contact some of these resources to see if they are accepting new patients and to see if they accept your insurance:  [email or web address]  [facility]   Dr. Alethea Hutchinson - [phone] [facility]) - [phone] [facility] [place]) - [phone] Dr. Daphine [facility]) [phone] [facility]) [phone] [facility] [place]) - [phone] [facility]) [phone] [facility], [address] [place], [place] [phone] Dr. Ricky Charter (neuropsychiatry); Margot Sheng; [facility]; 9th Floor; [place], [place] [phone] Dr. Todd Fossa; [facility]; [address]; [place], [place] [address]  [phone]   For psychiatric meds, mood meds: Please have your primary care physician manage these medications.  If you have any severe symptoms of a stroke, or other severe issues such as confusion,severe chills or fever, etc call 911 or go to the ER as you may need to be evaluated further   For guidance regarding [facility] and if placement were needed at the facility, contact Social Worker tel: [phone]  For assessment of decision of mental capacity and competency:  Call Dr. Laverne Potter, geriatric psychiatrist at [phone]  Counseling regarding caregiver distress, including caregiver depression, anxiety and issues regarding community resources, adult day care programs, adult living facilities, or  memory care questions:  please contact your  Primary Doctor's Social Worker   Whom to call: Memory  decline, memory medications: Call our office [phone]    [email or web address]   RECOMMENDATIONS FOR ALL PATIENTS WITH MEMORY PROBLEMS: 1. Continue to exercise (Recommend 30 minutes of walking everyday, or 3 hours every week) 2. Increase social interactions - continue going to [place] and enjoy social gatherings with friends and family 3. Eat healthy, avoid fried foods and eat more fruits and vegetables 4. Maintain adequate blood pressure, blood sugar, and blood cholesterol level. Reducing the risk of stroke and cardiovascular disease also helps promoting better memory. -5. Avoid stressful situations. Live a simple life and avoid aggravations. Organize your time and prepare for the next day in anticipation. 6. Sleep well, avoid any interruptions of sleep and avoid any distractions in the bedroom that may interfere with adequate sleep quality 7. Avoid sugar, avoid sweets as there is a strong link between excessive sugar intake, diabetes, and cognitive impairment We discussed the Mediterranean diet, which has been shown to help patients reduce the risk of progressive memory disorders and reduces cardiovascular risk. This includes eating fish, eat fruits and green leafy vegetables, nuts like almonds and hazelnuts, walnuts, and also use olive oil. Avoid fast foods and fried foods as much as possible. Avoid sweets and sugar as sugar use has been linked to worsening of memory function.  There is always a concern of gradual progression of memory problems. If this is the case, then we may need to adjust level of care according to patient needs. Support, both to the patient and caregiver, should then be put into place.      You have been referred for a neuropsychological evaluation (i.e., evaluation of memory and thinking abilities). Please bring  someone with you  to this appointment if possible, as it is helpful for the doctor to hear from both you and another adult who knows you well. Please bring eyeglasses and hearing aids if you wear them.    The evaluation will take approximately 3 hours and has two parts:   The first part is a clinical interview with the neuropsychologist (Dr. Kitty Perkins or Dr. Annette Barters). During the interview, the neuropsychologist will speak with you and the individual you brought to the appointment.    The second part of the evaluation is testing with the doctor's technician Bernabe Brew or Burdette Carolin). During the testing, the technician will ask you to remember different types of material, solve problems, and answer some questionnaires. Your family member will not be present for this portion of the evaluation.   Please note: We must reserve several hours of the neuropsychologist's time and the psychometrician's time for your evaluation appointment. As such, there is a No-Show fee of $100. If you are unable to attend any of your appointments, please contact our office as soon as possible to reschedule.      DRIVING: Regarding driving, in patients with progressive memory problems, driving will be impaired. We advise to have someone else do the driving if trouble finding directions or if minor accidents are reported. Independent driving assessment is available to determine safety of driving.   If you are interested in the driving assessment, you can contact the following:  [employer] in [place] [phone]  [facility] [phone]  [facility] [phone]  [facility] [phone] or [phone]   FALL PRECAUTIONS: Be cautious when walking. Scan the area for obstacles that may increase the risk of trips and falls. When getting up in the mornings, sit up at the edge of the bed for a few minutes before getting out of bed. Consider elevating the bed at the head end to avoid drop  of blood pressure when getting up. Walk always in a well-lit room (use night lights in the walls). Avoid area rugs or power cords from appliances in the middle of the walkways. Use a walker or a cane if necessary and consider physical therapy for balance exercise. Get your eyesight checked regularly.  FINANCIAL OVERSIGHT: Supervision, especially oversight when making financial decisions or transactions is also recommended.  HOME SAFETY: Consider the safety of the kitchen when operating appliances like stoves, microwave oven, and blender. Consider having supervision and share cooking responsibilities until no longer able to participate in those. Accidents with firearms and other hazards in the house should be identified and addressed as well.   ABILITY TO BE LEFT ALONE: If patient is unable to contact 911 operator, consider using LifeLine, or when the need is there, arrange for someone to stay with patients. Smoking is a fire hazard, consider supervision or cessation. Risk of wandering should be assessed by caregiver and if detected at any point, supervision and safe proof recommendations should be instituted.  MEDICATION SUPERVISION: Inability to self-administer medication needs to be constantly addressed. Implement a mechanism to ensure safe administration of the medications.      Mediterranean Diet A Mediterranean diet refers to food and lifestyle choices that are based on the traditions of countries located on the [employer]. This way of eating has been shown to help prevent certain conditions and improve outcomes for people who have chronic diseases, like kidney disease and heart disease. What are tips for following this plan? Lifestyle  Cook and eat meals together with your family, when  possible. Drink enough fluid to keep your urine clear or pale yellow. Be physically active every day. This includes: Aerobic exercise like running or swimming. Leisure activities like gardening,  walking, or housework. Get 7-8 hours of sleep each night. If recommended by your health care provider, drink red wine in moderation. This means 1 glass a day for nonpregnant women and 2 glasses a day for men. A glass of wine equals 5 oz (150 mL). Reading food labels  Check the serving size of packaged foods. For foods such as rice and pasta, the serving size refers to the amount of cooked product, not dry. Check the total fat in packaged foods. Avoid foods that have saturated fat or trans fats. Check the ingredients list for added sugars, such as corn syrup. Shopping  At the grocery store, buy most of your food from the areas near the walls of the store. This includes: Fresh fruits and vegetables (produce). Grains, beans, nuts, and seeds. Some of these may be available in unpackaged forms or large amounts (in bulk). Fresh seafood. Poultry and eggs. Low-fat dairy products. Buy whole ingredients instead of prepackaged foods. Buy fresh fruits and vegetables in-season from local farmers markets. Buy frozen fruits and vegetables in resealable bags. If you do not have access to quality fresh seafood, buy precooked frozen shrimp or canned fish, such as tuna, salmon, or sardines. Buy small amounts of raw or cooked vegetables, salads, or olives from the deli or salad bar at your store. Stock your pantry so you always have certain foods on hand, such as olive oil, canned tuna, canned tomatoes, rice, pasta, and beans. Cooking  Cook foods with extra-virgin olive oil instead of using butter or other vegetable oils. Have meat as a side dish, and have vegetables or grains as your main dish. This means having meat in small portions or adding small amounts of meat to foods like pasta or stew. Use beans or vegetables instead of meat in common dishes like chili or lasagna. Experiment with different cooking methods. Try roasting or broiling vegetables instead of steaming or sauteing them. Add frozen vegetables  to soups, stews, pasta, or rice. Add nuts or seeds for added healthy fat at each meal. You can add these to yogurt, salads, or vegetable dishes. Marinate fish or vegetables using olive oil, lemon juice, garlic, and fresh herbs. Meal planning  Plan to eat 1 vegetarian meal one day each week. Try to work up to 2 vegetarian meals, if possible. Eat seafood 2 or more times a week. Have healthy snacks readily available, such as: Vegetable sticks with hummus. Greek yogurt. Fruit and nut trail mix. Eat balanced meals throughout the week. This includes: Fruit: 2-3 servings a day Vegetables: 4-5 servings a day Low-fat dairy: 2 servings a day Fish, poultry, or lean meat: 1 serving a day Beans and legumes: 2 or more servings a week Nuts and seeds: 1-2 servings a day Whole grains: 6-8 servings a day Extra-virgin olive oil: 3-4 servings a day Limit red meat and sweets to only a few servings a month What are my food choices? Mediterranean diet Recommended Grains: Whole-grain pasta. Brown rice. Bulgar wheat. Polenta. Couscous. Whole-wheat bread. Dwyane Glad. Vegetables: Artichokes. Beets. Broccoli. Cabbage. Carrots. Eggplant. Green beans. Chard. Kale. Spinach. Onions. Leeks. Peas. Squash. Tomatoes. Peppers. Radishes. Fruits: Apples. Apricots. Avocado. Berries. Bananas. Cherries. Dates. Figs. Grapes. Lemons. Melon. Oranges. Peaches. Plums. Pomegranate. Meats and other protein foods: Beans. Almonds. Sunflower seeds. Pine nuts. Peanuts. Cod. Salmon. Scallops. Shrimp. Tuna. Tilapia.  Clams. Oysters. Eggs. Dairy: Low-fat milk. Cheese. Greek yogurt. Beverages: Water. Red wine. Herbal tea. Fats and oils: Extra virgin olive oil. Avocado oil. Grape seed oil. Sweets and desserts: [place] yogurt with honey. Baked apples. Poached pears. Trail mix. Seasoning and other foods: Basil. Cilantro. Coriander. Cumin. Mint. Parsley. Sage. Rosemary. Tarragon. Garlic. Oregano. Thyme. Pepper. Balsalmic vinegar. Tahini.  Hummus. Tomato sauce. Olives. Mushrooms. Limit these Grains: Prepackaged pasta or rice dishes. Prepackaged cereal with added sugar. Vegetables: Deep fried potatoes (french fries). Fruits: Fruit canned in syrup. Meats and other protein foods: Beef. Pork. Lamb. Poultry with skin. Hot dogs. [name] Loader. Dairy: Ice cream. Sour cream. Whole milk. Beverages: Juice. Sugar-sweetened soft drinks. Beer. Liquor and spirits. Fats and oils: Butter. Canola oil. Vegetable oil. Beef fat (tallow). Lard. Sweets and desserts: Cookies. Cakes. Pies. Candy. Seasoning and other foods: Mayonnaise. Premade sauces and marinades. The items listed may not be a complete list. Talk with your dietitian about what dietary choices are right for you. Summary The Mediterranean diet includes both food and lifestyle choices. Eat a variety of fresh fruits and vegetables, beans, nuts, seeds, and whole grains. Limit the amount of red meat and sweets that you eat. Talk with your health care provider about whether it is safe for you to drink red wine in moderation. This means 1 glass a day for nonpregnant women and 2 glasses a day for men. A glass of wine equals 5 oz (150 mL). This information is not intended to replace advice given to you by your health care provider. Make sure you discuss any questions you have with your health care provider. Document Released: 10/05/2015 Document Revised: 11/07/2015 Document Reviewed: 10/05/2015 Elsevier Interactive Patient Education  2017 [employer].

## 2023-07-15 ENCOUNTER — Ambulatory Visit (INDEPENDENT_AMBULATORY_CARE_PROVIDER_SITE_OTHER): Admitting: Infectious Disease

## 2023-07-15 ENCOUNTER — Other Ambulatory Visit: Payer: Self-pay

## 2023-07-15 ENCOUNTER — Encounter: Payer: Self-pay | Admitting: Infectious Disease

## 2023-07-15 VITALS — BP 125/84 | HR 67 | Resp 16 | Ht 64.0 in | Wt 235.0 lb

## 2023-07-15 DIAGNOSIS — B965 Pseudomonas (aeruginosa) (mallei) (pseudomallei) as the cause of diseases classified elsewhere: Secondary | ICD-10-CM | POA: Diagnosis not present

## 2023-07-15 DIAGNOSIS — A429 Actinomycosis, unspecified: Secondary | ICD-10-CM

## 2023-07-15 DIAGNOSIS — M86369 Chronic multifocal osteomyelitis, unspecified tibia and fibula: Secondary | ICD-10-CM | POA: Diagnosis not present

## 2023-07-15 DIAGNOSIS — S81802D Unspecified open wound, left lower leg, subsequent encounter: Secondary | ICD-10-CM

## 2023-07-15 DIAGNOSIS — T80218A Other infection due to central venous catheter, initial encounter: Secondary | ICD-10-CM | POA: Diagnosis not present

## 2023-07-15 DIAGNOSIS — M25519 Pain in unspecified shoulder: Secondary | ICD-10-CM | POA: Diagnosis not present

## 2023-07-15 DIAGNOSIS — M869 Osteomyelitis, unspecified: Secondary | ICD-10-CM | POA: Insufficient documentation

## 2023-07-15 DIAGNOSIS — A498 Other bacterial infections of unspecified site: Secondary | ICD-10-CM

## 2023-07-15 DIAGNOSIS — M25561 Pain in right knee: Secondary | ICD-10-CM | POA: Diagnosis not present

## 2023-07-15 DIAGNOSIS — Z880 Allergy status to penicillin: Secondary | ICD-10-CM

## 2023-07-15 HISTORY — DX: Osteomyelitis, unspecified: M86.9

## 2023-07-15 NOTE — Progress Notes (Signed)
 Subjective:  Complaint follow-up for infection after intra-osseious catheter with consequent polymicrobial infection  follow-up for infection after intraosseous catheter with consequent polymicrobial infection  Patient ID: [name], female    DOB: 1966-01-25, 58 y.o.   MRN: [id number]  HPI   58 year old female hx advance heart failure, dm2, recent prolonged admission 12/05/22-12/30/22 for vf cardiac arrest and left knee inter-osseous catheter that complicated with chronic wound here for f/u wound infection   She was seen by Dr. Gillian Lacrosse in clinic after she had gone debridement of [name] wound with placement of myriad tissue replacement matrix on 12/12 due to concerns of infection with exposed tendon. Or cx with E faecalis, Actinomyces spp, Actinomyces europaea.   Visit she was accompanied by her husband and said that she had been on her second round of ciprofloxacin .   In that visit she was prescribed linezolid  and referred to allergy  immunology for amoxicillin  challenge.  Well with amoxicillin  challenge and had no adverse reactions.   Interim history:  She is then seen by Dr. Shereen Dike on 02/28/23   ------------- 02/28/23 id initial clinic visit with me  01/27/23 seen by pcp and there is a ?swab wound cx that grew pseudomonas (cipro  mic 0.5 sensitive; levo 2 resistant) t. She had another I&D by plastic surgery 12/30.    Patient was taking linezolid  only and had finished cipro    Has no imaging but given the interosseous site there was concern for osteomyelitis     I also reviewed operative note from 12/12 and 02/24/23 of dr Larraine Plush of plastic surgery 12/12: Didn't appear the debridement needed to go to the bone. Tendon exposed as mentioned above 12/30: Tissue healing but patella tendon remains exposed. I&D done and more tissue matrix placed over the tendon. No operative culture sent   She reported sharp/shooting pain in the area. No f/c. No n/v/d. No blurry vision. No  numbness/tingling   Also wears life vest now waiting for icd placement which await clearance of Infection       04/08/23 id clinic f/u Patient had picc placed 2/5 and started on piptazo; amoxicillin  hold From previous discussion with plastic surgery would like to treat for pseudomonas colonization with abx in setting planned placement of graft over the exposed tendon.    The patient was taken to the operating room by Dr. Larraine Plush with plastic surgery on 18 February.  The patient had remaining outstanding wound healing with decrease in wound size approximately 50%.  She had exposed patellar tendon the dressings had a green hue which concerned him for potential Pseudomonas infection.  He therefore did not place Integra over the tendon during that surgery.  He did perform debridement of the anterior surface of the tendon which was 4.5 x 3 x 0.1 cm the necrotic portion tendon was sent for culture second culture was also taken from wound.  The entire was then irrigated saline and VAS HE.  Dressing was applied and the patient was awakened.  Intraoperative cultures have subsequent yielded Pseudomonas aeruginosa that was resistant to ceftazidime from culture within the tendon.  The other culture did not yield an organism.  She has remained on Zosyn .  When Dr. Shereen Dike was made aware of the culture results extended her Zosyn  to 26 May 2023.  Patient arrived in clinic again 25 minutes late when I saw her.  She made it appear that she was in so much pain that was difficult to walk from the clinic entrance to the  exam room.  She complained quite a bit about her pain being poorly controlled and her being intolerant of the oxycodone  that she had been prescribed.  She also says she feels overwhelmed with everything with regards to her health and condition.  She complained of pain in multiple areas in the body she also was concerned about an area where a cystic lesion on her scalp where she is exquisitely  tender palpation exam this area do not believe that this is infected likely represents a slight bruise.    06/09/23 id assessment --seen by Dr. Shereen Dike again  Based on 05/26/23 wound cx, there is still pseduomonas Concern is that the piptazo mic is hidden which usually suggest resistance    To circle back to the beginning, this is mainly a colonization process rather than infection and given that it's pseudomonas, I believe she can benefit from topical wound care to more quickly rid of the pseudomonas   If we keep on antibiotics alone without aggressive wound care I think we'll blow through antibiotics rather quickly in terms of resistance development   Will send this concern to dr Carolynne Citron and defer wound referral to him   She request abx pill be changed to liquid   -cipro  750 bid liquid -- duration I would like to treat 2 weeks beyond skin closure/graft -stop augmentin  -continue amox high dose 3 times a day for actinomyces -- duration probably planned to stop end of 07/2023   Discussed the use of AI scribe software for clinical note transcription with the patient, who gave verbal consent to proceed.  History of Present Illness   [name] is a 58 year old female who presents for follow-up on her infected interosseous venous access site.    She has been managing a bacterial infection since December 05, 2022, involving actinomyces  and other organisms. More recently  Pseudomonas was isolated though not clear that this was not just a colonizer rather than deep seeded organism. She is on long-term amoxicillin  therapy and has completed a course of ciprofloxacin  for Pseudomonas. Concerns about wound care include a reaction to expired collagen applied by a visiting nurse, causing increased pain. She is scheduled for a wound care appointment on August 14, 2023, and seeks guidance on optimal wound management practices. She experiences significant fatigue and anxiety regarding the progress of her infection  treatment, desiring expedited care and confirmation of the infection's status. No new surgeries are planned by her plastic surgeon.       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/2023   ADD (attention deficit disorder)    Anxiety    Arthritis    both knees right worse than left   Carpal tunnel syndrome of right wrist    Chronic back pain    Dysrhythmia    history of Vtach   Enterococcus faecalis infection 04/29/2023   Essential hypertension, benign    Gallstones    History of smoking 06/22/2016   Hyperlipidemia associated with type 2 diabetes mellitus (HCC), on Zocor  04/04/2011   Hypertension associated with diabetes (HCC) 06/03/2008   MDD (major depressive disorder)    Memory impairment    post cardiac arrest   Migraines    Morbid obesity (HCC)    Morbid obesity with BMI of 50.0-59.9, adult (HCC) 07/12/2006   NICM (nonischemic cardiomyopathy) (HCC) 07/12/2006   01/16/18 ECHO:    - Procedure narrative: Transthoracic echocardiography. Image   quality was suboptimal. The study was technically difficult.  Intravenous contrast (Definity ) was administered. - Left ventricle: The cavity size was moderately dilated. Wall   thickness was increased in a pattern of mild LVH. Systolic   function was moderately to severely reduced. The estimated   ejection fraction w   Normal coronary arteries 04/17/2016   OSA on CPAP 10/28/2014   does not use CPAP- pt cannot tolerate CPAP   Pseudomonas infection 04/29/2023   Spinal stenosis    back pain   Spondylosis without myelopathy or radiculopathy, lumbar region 12/04/2017   Type 2 diabetes mellitus with hyperglycemia (HCC)    Walker as ambulation aid     Past Surgical History:  Procedure Laterality Date   CHOLECYSTECTOMY     DILATATION & CURETTAGE/HYSTEROSCOPY WITH MYOSURE N/A 10/31/2017   Procedure: DILATATION & CURETTAGE/HYSTEROSCOPY WITH MYOSURE;  Surgeon: Wanita Gutta, MD;  Location: [facility];  Service:  Gynecology;  Laterality: N/A;   HYSTEROSCOPY WITH D & C N/A 05/07/2022   Procedure: DILATATION AND CURETTAGE /HYSTEROSCOPY;  Surgeon: Kiki Pelton, MD;  Location: [facility] OR;  Service: Gynecology;  Laterality: N/A;   INCISION AND DRAINAGE OF WOUND Left 02/06/2023   Procedure: debridement of left leg wound, application of myriad;  Surgeon: Teretha Ferguson, MD;  Location: [facility] OR;  Service: Plastics;  Laterality: Left;   INCISION AND DRAINAGE OF WOUND Left 02/24/2023   Procedure: application of tissue replacement matrix of left patellar tendon;  Surgeon: Teretha Ferguson, MD;  Location: [facility] OR;  Service: Plastics;  Laterality: Left;   INCISION AND DRAINAGE OF WOUND Left 04/15/2023   Procedure: irrigation and debridement to left lower extremity wound;  Surgeon: Teretha Ferguson, MD;  [facility];  Service: Plastics;  Laterality: Left;   IRRIGATION AND DEBRIDEMENT KNEE  02/24/2023   Procedure: IRRIGATION AND DEBRIDEMENT KNEE;  Surgeon: Teretha Ferguson, MD;  Location: [facility] OR;  Service: Plastics;;   life vest     RIGHT/LEFT HEART CATH AND CORONARY ANGIOGRAPHY N/A 04/11/2016   Procedure: Right/Left Heart Cath and Coronary Angiography;  Surgeon: Odie Benne, MD;  Location: [facility] INVASIVE CV LAB;  Service: Cardiovascular;  Laterality: N/A;   RIGHT/LEFT HEART CATH AND CORONARY ANGIOGRAPHY N/A 12/09/2022   Procedure: RIGHT/LEFT HEART CATH AND CORONARY ANGIOGRAPHY;  Surgeon: Knox Perl, MD;  Location: [facility] INVASIVE CV LAB;  Service: Cardiovascular;  Laterality: N/A;   sonogram for blood clots     no blockages    Family History  Problem Relation Age of Onset   Depression Mother    Anxiety disorder Mother    Diabetes Mother    Hypertension Mother    ADD / ADHD Father    Diabetes Father    Hypertension Father    Colitis Father    Hypertension Other    Diabetes Other    Colitis Other    Alcohol abuse Other       Social History   Socioeconomic History   Marital status: Married     Spouse name: [name]   Number of children: 1   Years of education: 16   Highest education level: Associate degree: academic program  Occupational History   Not on file  Tobacco Use   Smoking status: Former    Current packs/day: 0.00    Types: Cigarettes    Quit date: 02/25/1997    Years since quitting: 26.4   Smokeless tobacco: Never   Tobacco comments:    Married, lives with spouse (when he is not traveling) and son  Advertising account planner  Vaping status: Never Used  Substance and Sexual Activity   Alcohol use: No   Drug use: No   Sexual activity: Not Currently    Partners: Male    Birth control/protection: None  Other Topics Concern   Not on file  Social History Narrative   Right handed      Two story home   Lives with husband   No caffeine   Disablitiy    Social Drivers of Health   Financial Resource Strain: Low Risk  (02/10/2023)   Overall Financial Resource Strain (CARDIA)    Difficulty of Paying Living Expenses: Not hard at all  Food Insecurity: Food Insecurity Present (06/17/2023)   Hunger Vital Sign    Worried About Running Out of Food in the Last Year: Sometimes true    Ran Out of Food in the Last Year: Sometimes true  Transportation Needs: No Transportation Needs (06/17/2023)   PRAPARE - Administrator, Civil Service (Medical): No    Lack of Transportation (Non-Medical): No  Physical Activity: Inactive (06/17/2023)   Exercise Vital Sign    Days of Exercise per Week: 0 days    Minutes of Exercise per Session: 0 min  Stress: Stress Concern Present (06/17/2023)   [employer] of Occupational Health - Occupational Stress Questionnaire    Feeling of Stress : Rather much  Social Connections: Socially Isolated (02/10/2023)   Social Connection and Isolation Panel [NHANES]    Frequency of Communication with Friends and Family: Once a week    Frequency of Social Gatherings with Friends and Family: Once a week    Attends Religious Services: Never    Automotive engineer or Organizations: No    Attends Engineer, structural: Never    Marital Status: Married    Allergies  Allergen Reactions   Fluoxetine Other (See Comments)    More depressed   Cymbalta [Duloxetine Hcl] Other (See Comments)    depressed   Pork-Derived Products Other (See Comments)    Religious beliefs- does not eat pork  Medications ok if necessary     Current Outpatient Medications:    Accu-Chek Softclix Lancets lancets, Use as instructed, Disp: 100 each, Rfl: 12   acetaminophen  (TYLENOL ) 325 MG tablet, Take 1-2 tablets (325-650 mg total) by mouth every 4 (four) hours as needed for mild pain (pain score 1-3)., Disp: 100 tablet, Rfl: 0   AMBULATORY NON FORMULARY MEDICATION, Off-loading wheel chair seat cusion Dispense 1 Dx code: M53.3 Use as needed, Disp: 1 Units, Rfl: 0   amoxicillin  (AMOXIL ) 400 MG/5ML suspension, Take 12.5 mLs (1,000 mg total) by mouth 3 (three) times daily., Disp: 1125 mL, Rfl: 2   atorvastatin  (LIPITOR ) 80 MG tablet, TAKE 1 TABLET BY MOUTH EVERY DAY, Disp: 90 tablet, Rfl: 0   Blood Glucose Monitoring Suppl DEVI, 1 each by Does not apply route in the morning, at noon, and at bedtime. May substitute to any manufacturer covered by patient's insurance., Disp: 1 each, Rfl: 0   ciprofloxacin  (CIPRO ) 750 MG tablet, Take 1 tablet (750 mg total) by mouth 2 (two) times daily., Disp: 60 tablet, Rfl: 1   collagenase  (SANTYL ) 250 UNIT/GM ointment, Apply 1 Application topically daily., Disp: 15 g, Rfl: 0   dapagliflozin  propanediol (FARXIGA ) 10 MG TABS tablet, Take 1 tablet (10 mg total) by mouth daily., Disp: 90 tablet, Rfl: 1   fluticasone  (FLONASE ) 50 MCG/ACT nasal spray, SPRAY 2 SPRAYS INTO EACH NOSTRIL EVERY DAY, Disp: 48 mL, Rfl: 0  furosemide  (LASIX ) 40 MG tablet, Take 1 tablet (40 mg total) by mouth daily., Disp: 90 tablet, Rfl: 3   gabapentin  (NEURONTIN ) 300 MG capsule, Take 1 capsule (300 mg total) by mouth at bedtime., Disp: 30 capsule, Rfl: 1    gentian violet  (GNP GENTIAN VIOLET ) 1 % topical solution, Apply topically to affected area every 2-3 days as needed. May wash off in between applications., Disp: 59 mL, Rfl: 0   Glucose Blood (BLOOD GLUCOSE TEST STRIPS) STRP, 1 each by In Vitro route in the morning, at noon, and at bedtime. May substitute to any manufacturer covered by patient's insurance., Disp: 100 strip, Rfl: 11   hydrOXYzine  (ATARAX ) 25 MG tablet, Take 1 tablet (25 mg total) by mouth 3 (three) times daily as needed., Disp: 270 tablet, Rfl: 0   ketoconazole  (NIZORAL ) 2 % cream, Apply 1 Application topically daily. Apply 1gm to skin on plantar aspect of affected foot, and massage in well.  Apply once daily., Disp: 60 g, Rfl: 2   LORazepam  (ATIVAN ) 0.5 MG tablet, Take 1 tablet (0.5 mg total) by mouth 2 (two) times daily as needed for anxiety., Disp: 30 tablet, Rfl: 0   losartan  (COZAAR ) 25 MG tablet, Take 1 tablet (25 mg total) by mouth daily., Disp: 90 tablet, Rfl: 3   magnesium  oxide (MAG-OX) 400 MG tablet, Take 1 tablet (400 mg total) by mouth at bedtime., Disp: 90 tablet, Rfl: 1   metFORMIN  (GLUCOPHAGE -XR) 500 MG 24 hr tablet, Take 1 tablet (500 mg total) by mouth 2 (two) times daily with a meal., Disp: 180 tablet, Rfl: 1   metoprolol  succinate (TOPROL -XL) 50 MG 24 hr tablet, Take 1 tablet (50 mg total) by mouth daily. Take with or immediately following a meal., Disp: 90 tablet, Rfl: 1   morphine  (MS CONTIN ) 15 MG 12 hr tablet, Take 1 tablet (15 mg total) by mouth every 12 (twelve) hours., Disp: 60 tablet, Rfl: 0   NON FORMULARY, Pt uses cpap nightly, Disp: , Rfl:    ondansetron  (ZOFRAN -ODT) 4 MG disintegrating tablet, Take 1 tablet (4 mg total) by mouth every 8 (eight) hours as needed for nausea or vomiting., Disp: 60 tablet, Rfl: 1   polyethylene glycol powder (GLYCOLAX /MIRALAX ) 17 GM/SCOOP powder, Take 1 capful (17 g) in water 2 (two) times daily., Disp: 765 g, Rfl: 0   potassium chloride  SA (KLOR-CON  M) 20 MEQ tablet, Take 1  tablet (20 mEq total) by mouth daily., Disp: 90 tablet, Rfl: 1   senna-docusate (SENOKOT-S) 8.6-50 MG tablet, Take 2 tablets by mouth at bedtime., Disp: 60 tablet, Rfl: 0   spironolactone  (ALDACTONE ) 25 MG tablet, Take 0.5 tablets (12.5 mg total) by mouth daily., Disp: 90 tablet, Rfl: 1   terconazole  (TERAZOL 3 ) 80 MG vaginal suppository, Place 1 suppository (80 mg total) vaginally at bedtime., Disp: 3 suppository, Rfl: 2   vortioxetine  HBr (TRINTELLIX ) 5 MG TABS tablet, Take 1 tablet (5 mg total) by mouth daily., Disp: 30 tablet, Rfl: 1    Review of Systems  Constitutional:  Negative for activity change, appetite change, chills, diaphoresis, fatigue, fever and unexpected weight change.  HENT:  Negative for congestion, rhinorrhea, sinus pressure, sneezing, sore throat and trouble swallowing.   Eyes:  Negative for photophobia and visual disturbance.  Respiratory:  Negative for cough, chest tightness, shortness of breath, wheezing and stridor.   Cardiovascular:  Negative for chest pain, palpitations and leg swelling.  Gastrointestinal:  Negative for abdominal distention, abdominal pain, anal bleeding, blood in stool, constipation, diarrhea,  nausea and vomiting.  Genitourinary:  Negative for difficulty urinating, dysuria, flank pain and hematuria.  Musculoskeletal:  Positive for arthralgias and myalgias. Negative for back pain, gait problem and joint swelling.  Skin:  Positive for wound. Negative for color change, pallor and rash.  Neurological:  Negative for dizziness, tremors, weakness and light-headedness.  Hematological:  Negative for adenopathy. Does not bruise/bleed easily.  Psychiatric/Behavioral:  Negative for agitation, behavioral problems, confusion, decreased concentration, dysphoric mood and sleep disturbance.        Objective:   Physical Exam Constitutional:      General: She is not in acute distress.    Appearance: Normal appearance. She is well-developed. She is not  ill-appearing or diaphoretic.  HENT:     Head: Normocephalic and atraumatic.     Right Ear: Hearing and external ear normal.     Left Ear: Hearing and external ear normal.     Nose: No nasal deformity or rhinorrhea.  Eyes:     General: No scleral icterus.    Conjunctiva/sclera: Conjunctivae normal.     Right eye: Right conjunctiva is not injected.     Left eye: Left conjunctiva is not injected.     Pupils: Pupils are equal, round, and reactive to light.  Neck:     Vascular: No JVD.  Cardiovascular:     Rate and Rhythm: Normal rate and regular rhythm.     Heart sounds: S1 normal and S2 normal.  Pulmonary:     Effort: Pulmonary effort is normal. No respiratory distress.     Breath sounds: No wheezing.  Abdominal:     General: Bowel sounds are normal. There is no distension.     Palpations: Abdomen is soft.     Tenderness: There is no abdominal tenderness.  Musculoskeletal:     Right shoulder: Normal.     Left shoulder: Normal.     Cervical back: Normal range of motion and neck supple.     Right hip: Normal.     Left hip: Normal.     Right knee: Normal.     Left knee: Normal.  Lymphadenopathy:     Head:     Right side of head: No submandibular, preauricular or posterior auricular adenopathy.     Left side of head: No submandibular, preauricular or posterior auricular adenopathy.     Cervical: No cervical adenopathy.     Right cervical: No superficial or deep cervical adenopathy.    Left cervical: No superficial or deep cervical adenopathy.  Skin:    General: Skin is warm and dry.     Coloration: Skin is not pale.     Findings: No abrasion, bruising, ecchymosis, erythema, lesion or rash.     Nails: There is no clubbing.  Neurological:     Mental Status: She is alert and oriented to person, place, and time.     Sensory: No sensory deficit.     Coordination: Coordination normal.     Gait: Gait normal.  Psychiatric:        Attention and Perception: She is attentive.         Mood and Affect: Mood normal.        Speech: Speech normal.        Behavior: Behavior normal. Behavior is cooperative.        Thought Content: Thought content normal.        Judgment: Judgment normal.    Wound 07/15/23:       Assessment & Plan:  Assessment and Plan    Interosseous access site that became infected  Due to presence of Actinomyces we are giving her more protracted beta lactam therapy   she has had more ciprofloxacin  that Dr. Shereen Dike had planned for so I have asked her to stop this medicine and keep it in reserve check ESR, CRP, CMP, CBC w diff  Wound care management Concerns about wound care management and inconsistent messages from healthcare providers. Increased pain after using expired collagen. - Follow up with wound care on June 19. -she and her husband had a number of questions re which topical agents (that are not antibiotics) are helpful or not helpful in context of pseudomonas infection.  I have reiterated that this is NOT my area of expertise.  Pseudomonas infection Treatment with ciprofloxacin  complete. - Discontinue ciprofloxacin  immediately.  Pain in knee and shoulder Chronic pain in shoulder and right knee. Cortisone injections may have contributed to improved comfort.      Note she did not have a steroid injection into her affected knee this was a "hallucination" from [employer] app. She globally seems much improved.

## 2023-07-16 DIAGNOSIS — I152 Hypertension secondary to endocrine disorders: Secondary | ICD-10-CM | POA: Diagnosis not present

## 2023-07-16 DIAGNOSIS — B9689 Other specified bacterial agents as the cause of diseases classified elsewhere: Secondary | ICD-10-CM | POA: Diagnosis not present

## 2023-07-16 DIAGNOSIS — F333 Major depressive disorder, recurrent, severe with psychotic symptoms: Secondary | ICD-10-CM | POA: Diagnosis not present

## 2023-07-16 DIAGNOSIS — E1159 Type 2 diabetes mellitus with other circulatory complications: Secondary | ICD-10-CM | POA: Diagnosis not present

## 2023-07-16 DIAGNOSIS — I5042 Chronic combined systolic (congestive) and diastolic (congestive) heart failure: Secondary | ICD-10-CM | POA: Diagnosis not present

## 2023-07-16 DIAGNOSIS — T8149XA Infection following a procedure, other surgical site, initial encounter: Secondary | ICD-10-CM | POA: Diagnosis not present

## 2023-07-16 LAB — CBC WITH DIFFERENTIAL/PLATELET
Absolute Lymphocytes: 1786 {cells}/uL (ref 850–3900)
Absolute Monocytes: 922 {cells}/uL (ref 200–950)
Basophils Absolute: 43 {cells}/uL (ref 0–200)
Basophils Relative: 0.3 %
Eosinophils Absolute: 101 {cells}/uL (ref 15–500)
Eosinophils Relative: 0.7 %
HCT: 47.8 % — ABNORMAL HIGH (ref 35.0–45.0)
Hemoglobin: 15.1 g/dL (ref 11.7–15.5)
MCH: 26.4 pg — ABNORMAL LOW (ref 27.0–33.0)
MCHC: 31.6 g/dL — ABNORMAL LOW (ref 32.0–36.0)
MCV: 83.7 fL (ref 80.0–100.0)
MPV: 9.6 fL (ref 7.5–12.5)
Monocytes Relative: 6.4 %
Neutro Abs: 11549 {cells}/uL — ABNORMAL HIGH (ref 1500–7800)
Neutrophils Relative %: 80.2 %
Platelets: 363 10*3/uL (ref 140–400)
RBC: 5.71 10*6/uL — ABNORMAL HIGH (ref 3.80–5.10)
RDW: 12.9 % (ref 11.0–15.0)
Total Lymphocyte: 12.4 %
WBC: 14.4 10*3/uL — ABNORMAL HIGH (ref 3.8–10.8)

## 2023-07-16 LAB — BASIC METABOLIC PANEL WITHOUT GFR
BUN/Creatinine Ratio: 42 (calc) — ABNORMAL HIGH (ref 6–22)
BUN: 35 mg/dL — ABNORMAL HIGH (ref 7–25)
CO2: 25 mmol/L (ref 20–32)
Calcium: 9.4 mg/dL (ref 8.6–10.4)
Chloride: 105 mmol/L (ref 98–110)
Creat: 0.83 mg/dL (ref 0.50–1.03)
Glucose, Bld: 112 mg/dL — ABNORMAL HIGH (ref 65–99)
Potassium: 4.1 mmol/L (ref 3.5–5.3)
Sodium: 140 mmol/L (ref 135–146)

## 2023-07-16 LAB — C-REACTIVE PROTEIN: CRP: 4.3 mg/L (ref <8.0)

## 2023-07-16 LAB — SEDIMENTATION RATE: Sed Rate: 17 mm/h (ref 0–30)

## 2023-07-17 DIAGNOSIS — I5042 Chronic combined systolic (congestive) and diastolic (congestive) heart failure: Secondary | ICD-10-CM | POA: Diagnosis not present

## 2023-07-17 DIAGNOSIS — E1159 Type 2 diabetes mellitus with other circulatory complications: Secondary | ICD-10-CM | POA: Diagnosis not present

## 2023-07-17 DIAGNOSIS — T8149XA Infection following a procedure, other surgical site, initial encounter: Secondary | ICD-10-CM | POA: Diagnosis not present

## 2023-07-17 DIAGNOSIS — I152 Hypertension secondary to endocrine disorders: Secondary | ICD-10-CM | POA: Diagnosis not present

## 2023-07-17 DIAGNOSIS — B9689 Other specified bacterial agents as the cause of diseases classified elsewhere: Secondary | ICD-10-CM | POA: Diagnosis not present

## 2023-07-17 DIAGNOSIS — F333 Major depressive disorder, recurrent, severe with psychotic symptoms: Secondary | ICD-10-CM | POA: Diagnosis not present

## 2023-07-18 ENCOUNTER — Telehealth: Payer: Self-pay | Admitting: Family Medicine

## 2023-07-18 DIAGNOSIS — G8929 Other chronic pain: Secondary | ICD-10-CM

## 2023-07-18 DIAGNOSIS — M47816 Spondylosis without myelopathy or radiculopathy, lumbar region: Secondary | ICD-10-CM

## 2023-07-18 NOTE — Telephone Encounter (Signed)
 Husband [name] called and said 2 months ago we sent imaging for lower back for cortisone shot and they did not do it because of her knee.  He says she is feeling better and wants to try and they called them and told him they did not have referral. He wants to know if it has been sent. He says they discussed at last visit.  Please advise.

## 2023-07-18 NOTE — Telephone Encounter (Signed)
 Facet back injections ordered.  [facility] should contact you.  If you wanted to call them yourself the phone number is [phone].

## 2023-07-22 DIAGNOSIS — M129 Arthropathy, unspecified: Secondary | ICD-10-CM | POA: Diagnosis not present

## 2023-07-22 DIAGNOSIS — G629 Polyneuropathy, unspecified: Secondary | ICD-10-CM | POA: Diagnosis not present

## 2023-07-22 DIAGNOSIS — T148XXA Other injury of unspecified body region, initial encounter: Secondary | ICD-10-CM | POA: Diagnosis not present

## 2023-07-22 DIAGNOSIS — M79605 Pain in left leg: Secondary | ICD-10-CM | POA: Diagnosis not present

## 2023-07-22 DIAGNOSIS — G8929 Other chronic pain: Secondary | ICD-10-CM | POA: Diagnosis not present

## 2023-07-22 DIAGNOSIS — Z79899 Other long term (current) drug therapy: Secondary | ICD-10-CM | POA: Diagnosis not present

## 2023-07-23 ENCOUNTER — Telehealth: Payer: Self-pay

## 2023-07-23 ENCOUNTER — Ambulatory Visit (INDEPENDENT_AMBULATORY_CARE_PROVIDER_SITE_OTHER): Admitting: Mental Health

## 2023-07-23 ENCOUNTER — Other Ambulatory Visit: Payer: Self-pay | Admitting: Cardiology

## 2023-07-23 DIAGNOSIS — F419 Anxiety disorder, unspecified: Secondary | ICD-10-CM

## 2023-07-23 DIAGNOSIS — F332 Major depressive disorder, recurrent severe without psychotic features: Secondary | ICD-10-CM

## 2023-07-23 NOTE — Progress Notes (Unsigned)
   THERAPIST PROGRESS NOTE  Session Time: 2:00 pm  Participation Level: Active  Behavioral Response: CasualAlert{BHH MOOD:22306}  Type of Therapy: Individual Therapy  Treatment Goals addressed: STG: "To cope." [name] will increase management of moods AEB development of x3 effective coping skills with ability to process thoughts and feelings in balanced manner within the next 90 days.   ProgressTowards Goals: {Progress Towards Goals:21014066}  Interventions: CBT and Supportive  Summary:  [name] is a 58 y.o. female who presents with dx of major depression recurrent severe.  Presents for session alert and oriented; mood and affect dysphoric. In noticeable pain; slow gait with use of walker. Shares ongoing feelings of frustration, " I am not doing well. " Shares stressors with presence of pain in shoulder, back leg. Notes concerned for government and    Suicidal/Homicidal: Nowithout intent/plan  Therapist Response:  Therapist engaged [name] in therapy session. Assessed for current level of functioning, sxs management and current stressors. Provided safe space to share thoughts and feelings in regards to current stressors. Active empathic listening; Provided support and   Plan: Return again in *** weeks.  Diagnosis: Major depressive disorder, recurrent episode, severe with anxious distress (HCC)  Anxiety  Collaboration of Care: Other None  Patient/Guardian was advised Release of Information must be obtained prior to any record release in order to collaborate their care with an outside provider. Patient/Guardian was advised if they have not already done so to contact the registration department to sign all necessary forms in order for us  to release information regarding their care.   Consent: Patient/Guardian gives verbal consent for treatment and assignment of benefits for services provided during this visit. Patient/Guardian expressed understanding and agreed to proceed.    Carmel Chimes [place], [facility] 07/23/2023

## 2023-07-23 NOTE — Telephone Encounter (Signed)
 Referral Placed to [facility], they have received referral and paper work has been sent to patient. They will follow up with Patient on appt.

## 2023-07-24 ENCOUNTER — Ambulatory Visit (INDEPENDENT_AMBULATORY_CARE_PROVIDER_SITE_OTHER): Admitting: Plastic Surgery

## 2023-07-24 DIAGNOSIS — S81802D Unspecified open wound, left lower leg, subsequent encounter: Secondary | ICD-10-CM

## 2023-07-24 NOTE — Progress Notes (Signed)
 Referring Provider Arcadio Knuckles, MD [address] [place],  [place] [address]   CC: No chief complaint on file.     [name] is an 58 y.o. female.  HPI: Patient is a 58 year old female with a history of a large wound to her left lower extremity. She has undergone several debridements and is here for further follow-up of her wound.   Patient was last seen in the clinic on 07/08/2023.  At this visit, patient and her husband had reported that they had tried collagen, which did not seem to help.  On exam, wound to the left lower extremity was approximately 11 x 13 cm.  There is healthy granulation tissue throughout the wound with some mild exudate.  It is recommended that patient continue with previous dressing changes.  Today,  Review of Systems General: Left leg wound, patient reports occasional yellowish drainage but no green.  She has generalized pain including pain in the wound and distal to the wound.  Physical Exam    07/15/2023   11:14 AM 07/14/2023    3:00 PM 07/09/2023   11:50 AM  Vitals with BMI  Height 5\' 4"   5\' 4"   Weight 235 lbs 238 lbs   BMI 40.32 40.83   Systolic 125 140   Diastolic 84 80   Pulse 67 80     General:  No acute distress,  Alert and oriented, Non-Toxic, Normal speech and affect Skin: Patient has a 9 x 11 cm superficial wound over the left knee and pretibial region.  The wound has excellent granulation tissue and there is no evidence of ongoing infection at this time.  The wound is markedly smaller than the last time I saw it.  Assessment/Plan Wound, left leg: [name] is slowly healing the left knee and pretibial wound which resulted from an intraosseous infusion.  The wound has been slow to heal but has been making progress at all visits.  There was some question about the use of collagen by the home health care team.  I am not sure what the issue was either with the expiration or with the type of product used.  [name] states that she had  increased discomfort after the product was used.  I have told both she and her husband that the use of collagen products is simply to help speed the wound healing process.  I believe that she will continue to reepithelialized the wound whether collagen is used or not.  They have agreed to try a new product that we normally use in the office but we will discontinue it if she has any discomfort.  I have encouraged her to continue with range of motion and to increase range of motion as tolerated.  From my standpoint the patient does not need antibiotics any longer.  There is no evidence of ongoing infection.  I will leave this decision up to her infectious disease team.  As far as placement of her ICD that decision lies solely with the cardiology team.  I would recommend that they coordinate this decision with infectious disease.  I have no problem with either cardiology or infectious disease referring the patient to the wound care team if they like.  I do not see what they have to offer beyond the care that we are providing but I certainly have no problem with the patient being referred if they feel that that is appropriate.  [name] will follow-up with us  in 2 to 3 weeks.

## 2023-07-25 ENCOUNTER — Other Ambulatory Visit: Payer: Self-pay | Admitting: Cardiology

## 2023-07-25 DIAGNOSIS — E1159 Type 2 diabetes mellitus with other circulatory complications: Secondary | ICD-10-CM | POA: Diagnosis not present

## 2023-07-25 DIAGNOSIS — I5042 Chronic combined systolic (congestive) and diastolic (congestive) heart failure: Secondary | ICD-10-CM | POA: Diagnosis not present

## 2023-07-25 DIAGNOSIS — T8149XA Infection following a procedure, other surgical site, initial encounter: Secondary | ICD-10-CM | POA: Diagnosis not present

## 2023-07-25 DIAGNOSIS — B9689 Other specified bacterial agents as the cause of diseases classified elsewhere: Secondary | ICD-10-CM | POA: Diagnosis not present

## 2023-07-25 DIAGNOSIS — F333 Major depressive disorder, recurrent, severe with psychotic symptoms: Secondary | ICD-10-CM | POA: Diagnosis not present

## 2023-07-25 DIAGNOSIS — I152 Hypertension secondary to endocrine disorders: Secondary | ICD-10-CM | POA: Diagnosis not present

## 2023-07-26 ENCOUNTER — Other Ambulatory Visit: Payer: Self-pay | Admitting: Internal Medicine

## 2023-07-26 DIAGNOSIS — E785 Hyperlipidemia, unspecified: Secondary | ICD-10-CM

## 2023-07-27 ENCOUNTER — Ambulatory Visit
Admission: RE | Admit: 2023-07-27 | Discharge: 2023-07-27 | Disposition: A | Discharge status: Home / Self Care | Source: Ambulatory Visit | Attending: Physician Assistant | Admitting: Physician Assistant

## 2023-07-27 DIAGNOSIS — R413 Other amnesia: Secondary | ICD-10-CM | POA: Diagnosis not present

## 2023-07-27 DIAGNOSIS — R262 Difficulty in walking, not elsewhere classified: Secondary | ICD-10-CM | POA: Diagnosis not present

## 2023-07-28 ENCOUNTER — Ambulatory Visit
Admission: RE | Admit: 2023-07-28 | Discharge: 2023-07-28 | Disposition: A | Discharge status: Home / Self Care | Source: Ambulatory Visit | Attending: Family Medicine | Admitting: Family Medicine

## 2023-07-28 ENCOUNTER — Telehealth: Payer: Self-pay | Admitting: *Deleted

## 2023-07-28 DIAGNOSIS — M47816 Spondylosis without myelopathy or radiculopathy, lumbar region: Secondary | ICD-10-CM

## 2023-07-28 DIAGNOSIS — G8929 Other chronic pain: Secondary | ICD-10-CM

## 2023-07-28 MED ORDER — IOPAMIDOL (ISOVUE-M 200) INJECTION 41%
1.0000 mL | Freq: Once | INTRAMUSCULAR | Status: AC
  Administered 2023-07-28: 1 mL via INTRA_ARTICULAR

## 2023-07-28 MED ORDER — METHYLPREDNISOLONE ACETATE 40 MG/ML INJ SUSP (RADIOLOG
80.0000 mg | Freq: Once | INTRAMUSCULAR | Status: AC
Start: 2023-07-28 — End: 2023-07-28
  Administered 2023-07-28: 80 mg via INTRA_ARTICULAR

## 2023-07-28 NOTE — Discharge Instructions (Signed)

## 2023-07-28 NOTE — Telephone Encounter (Signed)
 Received on (07/26/2023) via of fax Physician Order from [facility].  Requesting signature,date,and return.  Given to provider to complete.  Physician Order signed,dated,and faxed back to [facility].  Confirmation received and copy scanned into the chart.//AB/CMA

## 2023-07-29 ENCOUNTER — Ambulatory Visit: Attending: Physician Assistant | Admitting: Physician Assistant

## 2023-07-29 ENCOUNTER — Ambulatory Visit: Payer: Self-pay | Admitting: Physician Assistant

## 2023-07-29 ENCOUNTER — Encounter: Payer: Self-pay | Admitting: Physician Assistant

## 2023-07-29 VITALS — BP 104/64 | HR 89 | Ht 64.0 in | Wt 231.0 lb

## 2023-07-29 DIAGNOSIS — I469 Cardiac arrest, cause unspecified: Secondary | ICD-10-CM | POA: Diagnosis not present

## 2023-07-29 DIAGNOSIS — E119 Type 2 diabetes mellitus without complications: Secondary | ICD-10-CM | POA: Diagnosis not present

## 2023-07-29 DIAGNOSIS — I428 Other cardiomyopathies: Secondary | ICD-10-CM | POA: Diagnosis not present

## 2023-07-29 DIAGNOSIS — E785 Hyperlipidemia, unspecified: Secondary | ICD-10-CM | POA: Diagnosis not present

## 2023-07-29 DIAGNOSIS — I4901 Ventricular fibrillation: Secondary | ICD-10-CM | POA: Diagnosis not present

## 2023-07-29 DIAGNOSIS — I10 Essential (primary) hypertension: Secondary | ICD-10-CM | POA: Insufficient documentation

## 2023-07-29 MED ORDER — LOSARTAN POTASSIUM 25 MG PO TABS: 12.5000 mg | ORAL_TABLET | Freq: Every day | ORAL | Status: DC

## 2023-07-29 MED ORDER — BLOOD PRESSURE KIT DEVI: 1.0000 [IU] | Freq: Once | 0 refills | Status: AC

## 2023-07-29 NOTE — Patient Instructions (Signed)
 Medication Instructions:  DECREASE LOSARTAN  TO 12.5 MG DAILY *If you need a refill on your cardiac medications before your next appointment, please call your pharmacy*  Lab Work: NO LABS If you have labs (blood work) drawn today and your tests are completely normal, you will receive your results only by: MyChart Message (if you have MyChart) OR A paper copy in the mail If you have any lab test that is abnormal or we need to change your treatment, we will call you to review the results.  Testing/Procedures: NO TESTING  Follow-Up: At [facility], you and your health needs are our priority.  As part of our continuing mission to provide you with exceptional heart care, our providers are all part of one team.  This team includes your primary Cardiologist (physician) and Advanced Practice Providers or APPs (Physician Assistants and Nurse Practitioners) who all work together to provide you with the care you need, when you need it.  Your next appointment:   6 week(s)  Provider:   Hao Meng, PA-C      Then, Eilleen Grates, MD will plan to see you again in 3 month(s).

## 2023-07-29 NOTE — Progress Notes (Unsigned)
 Cardiology Office Note   Date:  07/29/2023  ID:  [name], DOB 23-Oct-1965, MRN [id number] PCP: Arcadio Knuckles, MD  [facility] Providers Cardiologist:  Eilleen Grates, MD { Click to update primary MD,subspecialty MD or APP then REFRESH:1}    History of Present Illness [name] is a 58 y.o. female with a hx of ADD, depression, HTN, HLD, DM II, morbid obesity, obstructive sleep apnea on CPAP therapy and a history of nonischemic cardiomyopathy.  Cardiac catheterization in February 2018 revealed normal coronary arteries, EF 25 to 30%, mild pulmonary hypertension, elevated LVEDP.  She had acute on chronic systolic and diastolic heart failure in August 2018.  Repeat echocardiogram in November 2019 showed EF 30 to 35%. Echocardiogram in November 2021 showed EF has improved to 50 to 55%.  Patient was seen by Dr. Lavonne Prairie on 06/19/2020, it seems she has been dealing with depression and stopped all her medication for over 2 months, the medication has been restarted at lower dose.  Venous Doppler in 2019 was negative for DVT.  Unfortunately patient was admitted in October 2024 with witnessed cardiac arrest.  She was in V-fib on arrival and was shocked 6 times and underwent CPR/ACLS for 40 minutes with ROSC 8.  She was started on amiodarone  drip.  Cardiac catheterization showed normal coronary arteries with preserved cardiac output.  Echocardiogram obtained on 12/07/2022 showed EF 30 to 35%. Cardiac MRI showed EF 31% with normal RV.  EP was consulted.  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  She developed a full-thickness wound.  Plastic surgery was consulted.  ICD was not placed due to lower extremity wound.  Patient was last seen by Dr. Lavonne Prairie in April 2025, Cozaar  was increased to 25 mg daily.Since then, she has been seen by EP service who think the patient continued to need ICD.  EP is reaching out to her plastic surgeon and the infectious disease  physician to discuss possible timing of ICD implantation.  Patient presents today for follow-up.  She denies any chest pain or shortness of breath.  She has poor memory.  On manual recheck by myself, her blood pressure is low and 96/62.  I will reduce losartan  to 12.5 mg daily.  Plastic surgery has cleared the patient to come off of antibiotic.  Plastic surgery deferred the decision to proceed with ICD to cardiology and ID service.  Patient is scheduled to see Dr. Shereen Dike of ID service in June.  She can follow-up with Dr. Lawana Pray in 6 weeks hopefully at which time patient can be considered to proceed with ICD.  She can follow-up with Dr. Lavonne Prairie in 3 months.   ROS: ***  Studies Reviewed      *** Risk Assessment/Calculations {Does this patient have ATRIAL FIBRILLATION?:[phone]}         Physical Exam VS:  BP 104/64   Pulse 89   Ht 5\' 4"  (1.626 m)   Wt 231 lb (104.8 kg)   LMP  (LMP Unknown)   SpO2 95%   BMI 39.65 kg/m    Wt Readings from Last 3 Encounters:  07/29/23 231 lb (104.8 kg)  07/15/23 235 lb (106.6 kg)  07/14/23 238 lb (108 kg)    GEN: Well nourished, well developed in no acute distress NECK: No JVD; No carotid bruits CARDIAC: ***RRR, no murmurs, rubs, gallops RESPIRATORY:  Clear to auscultation without rales, wheezing or rhonchi  ABDOMEN: Soft, non-tender, non-distended EXTREMITIES:  No edema; No deformity   ASSESSMENT  AND PLAN ***    {Are you ordering a CV Procedure (e.g. stress test, cath, DCCV, TEE, etc)?   Press F2        :[id number]}  Dispo: ***  Signed, Ervin Heath, PA

## 2023-07-31 ENCOUNTER — Telehealth (HOSPITAL_COMMUNITY): Payer: Self-pay | Admitting: Mental Health

## 2023-07-31 ENCOUNTER — Other Ambulatory Visit: Payer: Self-pay | Admitting: Internal Medicine

## 2023-07-31 DIAGNOSIS — M51362 Other intervertebral disc degeneration, lumbar region with discogenic back pain and lower extremity pain: Secondary | ICD-10-CM

## 2023-07-31 DIAGNOSIS — I152 Hypertension secondary to endocrine disorders: Secondary | ICD-10-CM | POA: Diagnosis not present

## 2023-07-31 DIAGNOSIS — G8929 Other chronic pain: Secondary | ICD-10-CM

## 2023-07-31 DIAGNOSIS — E1159 Type 2 diabetes mellitus with other circulatory complications: Secondary | ICD-10-CM | POA: Diagnosis not present

## 2023-07-31 DIAGNOSIS — Z515 Encounter for palliative care: Secondary | ICD-10-CM

## 2023-07-31 DIAGNOSIS — B9689 Other specified bacterial agents as the cause of diseases classified elsewhere: Secondary | ICD-10-CM | POA: Diagnosis not present

## 2023-07-31 DIAGNOSIS — F333 Major depressive disorder, recurrent, severe with psychotic symptoms: Secondary | ICD-10-CM | POA: Diagnosis not present

## 2023-07-31 DIAGNOSIS — M47816 Spondylosis without myelopathy or radiculopathy, lumbar region: Secondary | ICD-10-CM

## 2023-07-31 DIAGNOSIS — T8149XA Infection following a procedure, other surgical site, initial encounter: Secondary | ICD-10-CM | POA: Diagnosis not present

## 2023-07-31 DIAGNOSIS — M792 Neuralgia and neuritis, unspecified: Secondary | ICD-10-CM

## 2023-07-31 DIAGNOSIS — I5042 Chronic combined systolic (congestive) and diastolic (congestive) heart failure: Secondary | ICD-10-CM | POA: Diagnosis not present

## 2023-07-31 MED ORDER — MORPHINE SULFATE ER 15 MG PO TBCR: 15.0000 mg | EXTENDED_RELEASE_TABLET | Freq: Two times a day (BID) | ORAL | 0 refills | Status: DC

## 2023-07-31 NOTE — Telephone Encounter (Signed)
 Copied from CRM [id number]. Topic: Clinical - Medication Refill >> Jul 31, 2023  2:06 PM [place] E wrote: Medication: morphine  (MS CONTIN ) 15 MG 12 hr tablet  Has the patient contacted their pharmacy? Yes (Agent: If no, request that the patient contact the pharmacy for the refill. If patient does not wish to contact the pharmacy document the reason why and proceed with request.) (Agent: If yes, when and what did the pharmacy advise?)  This is the patient's preferred pharmacy:  [id number] - [address] [place] [address] Phone: [phone] Fax: [phone]  Is this the correct pharmacy for this prescription? Yes If no, delete pharmacy and type the correct one.   Has the prescription been filled recently? No  Is the patient out of the medication? Yes  Has the patient been seen for an appointment in the last year OR does the patient have an upcoming appointment? Yes  Can we respond through MyChart? Yes  Agent: Please be advised that Rx refills may take up to 3 business days. We ask that you follow-up with your pharmacy.

## 2023-07-31 NOTE — Telephone Encounter (Signed)
 Last Fill: 05/22/23 60 tabs/0 RF  Last OV: 05/22/23 Next OV: 02/11/24 AWV  Routing to provider for review/authorization.

## 2023-07-31 NOTE — Telephone Encounter (Signed)
 Therapist contacted pt to discuss OPT appointments

## 2023-08-01 DIAGNOSIS — G5601 Carpal tunnel syndrome, right upper limb: Secondary | ICD-10-CM | POA: Diagnosis not present

## 2023-08-01 DIAGNOSIS — E1169 Type 2 diabetes mellitus with other specified complication: Secondary | ICD-10-CM | POA: Diagnosis not present

## 2023-08-01 DIAGNOSIS — I152 Hypertension secondary to endocrine disorders: Secondary | ICD-10-CM | POA: Diagnosis not present

## 2023-08-01 DIAGNOSIS — E669 Obesity, unspecified: Secondary | ICD-10-CM | POA: Diagnosis not present

## 2023-08-01 DIAGNOSIS — I080 Rheumatic disorders of both mitral and aortic valves: Secondary | ICD-10-CM | POA: Diagnosis not present

## 2023-08-01 DIAGNOSIS — E785 Hyperlipidemia, unspecified: Secondary | ICD-10-CM | POA: Diagnosis not present

## 2023-08-01 DIAGNOSIS — M47816 Spondylosis without myelopathy or radiculopathy, lumbar region: Secondary | ICD-10-CM | POA: Diagnosis not present

## 2023-08-01 DIAGNOSIS — Z6841 Body Mass Index (BMI) 40.0 and over, adult: Secondary | ICD-10-CM | POA: Diagnosis not present

## 2023-08-01 DIAGNOSIS — D539 Nutritional anemia, unspecified: Secondary | ICD-10-CM | POA: Diagnosis not present

## 2023-08-01 DIAGNOSIS — F333 Major depressive disorder, recurrent, severe with psychotic symptoms: Secondary | ICD-10-CM | POA: Diagnosis not present

## 2023-08-01 DIAGNOSIS — E1159 Type 2 diabetes mellitus with other circulatory complications: Secondary | ICD-10-CM | POA: Diagnosis not present

## 2023-08-01 DIAGNOSIS — T8149XA Infection following a procedure, other surgical site, initial encounter: Secondary | ICD-10-CM | POA: Diagnosis not present

## 2023-08-01 DIAGNOSIS — I5042 Chronic combined systolic (congestive) and diastolic (congestive) heart failure: Secondary | ICD-10-CM | POA: Diagnosis not present

## 2023-08-01 DIAGNOSIS — I4901 Ventricular fibrillation: Secondary | ICD-10-CM | POA: Diagnosis not present

## 2023-08-01 DIAGNOSIS — G43909 Migraine, unspecified, not intractable, without status migrainosus: Secondary | ICD-10-CM | POA: Diagnosis not present

## 2023-08-01 DIAGNOSIS — Z792 Long term (current) use of antibiotics: Secondary | ICD-10-CM | POA: Diagnosis not present

## 2023-08-01 DIAGNOSIS — G4733 Obstructive sleep apnea (adult) (pediatric): Secondary | ICD-10-CM | POA: Diagnosis not present

## 2023-08-01 DIAGNOSIS — L89151 Pressure ulcer of sacral region, stage 1: Secondary | ICD-10-CM | POA: Diagnosis not present

## 2023-08-01 DIAGNOSIS — G8929 Other chronic pain: Secondary | ICD-10-CM | POA: Diagnosis not present

## 2023-08-01 DIAGNOSIS — I252 Old myocardial infarction: Secondary | ICD-10-CM | POA: Diagnosis not present

## 2023-08-01 DIAGNOSIS — M4800 Spinal stenosis, site unspecified: Secondary | ICD-10-CM | POA: Diagnosis not present

## 2023-08-01 DIAGNOSIS — B9689 Other specified bacterial agents as the cause of diseases classified elsewhere: Secondary | ICD-10-CM | POA: Diagnosis not present

## 2023-08-01 DIAGNOSIS — I428 Other cardiomyopathies: Secondary | ICD-10-CM | POA: Diagnosis not present

## 2023-08-01 DIAGNOSIS — M170 Bilateral primary osteoarthritis of knee: Secondary | ICD-10-CM | POA: Diagnosis not present

## 2023-08-01 DIAGNOSIS — F988 Other specified behavioral and emotional disorders with onset usually occurring in childhood and adolescence: Secondary | ICD-10-CM | POA: Diagnosis not present

## 2023-08-04 ENCOUNTER — Telehealth: Payer: Self-pay | Admitting: Licensed Clinical Social Worker

## 2023-08-04 ENCOUNTER — Encounter: Payer: Self-pay | Admitting: Licensed Clinical Social Worker

## 2023-08-06 ENCOUNTER — Telehealth: Payer: Self-pay

## 2023-08-06 ENCOUNTER — Other Ambulatory Visit: Payer: Self-pay | Admitting: Internal Medicine

## 2023-08-06 DIAGNOSIS — E1165 Type 2 diabetes mellitus with hyperglycemia: Secondary | ICD-10-CM

## 2023-08-06 NOTE — Telephone Encounter (Signed)
 Patient is scheduled for Dr.Renfroe at [facility]. Fro June 26/2025 at 9:00am.

## 2023-08-07 DIAGNOSIS — E1159 Type 2 diabetes mellitus with other circulatory complications: Secondary | ICD-10-CM | POA: Diagnosis not present

## 2023-08-07 DIAGNOSIS — F333 Major depressive disorder, recurrent, severe with psychotic symptoms: Secondary | ICD-10-CM | POA: Diagnosis not present

## 2023-08-07 DIAGNOSIS — B9689 Other specified bacterial agents as the cause of diseases classified elsewhere: Secondary | ICD-10-CM | POA: Diagnosis not present

## 2023-08-07 DIAGNOSIS — T8149XA Infection following a procedure, other surgical site, initial encounter: Secondary | ICD-10-CM | POA: Diagnosis not present

## 2023-08-07 DIAGNOSIS — I152 Hypertension secondary to endocrine disorders: Secondary | ICD-10-CM | POA: Diagnosis not present

## 2023-08-07 DIAGNOSIS — L89151 Pressure ulcer of sacral region, stage 1: Secondary | ICD-10-CM | POA: Diagnosis not present

## 2023-08-12 ENCOUNTER — Ambulatory Visit: Admitting: Student

## 2023-08-12 VITALS — BP 125/88 | HR 84

## 2023-08-12 DIAGNOSIS — S81802D Unspecified open wound, left lower leg, subsequent encounter: Secondary | ICD-10-CM

## 2023-08-12 NOTE — Progress Notes (Signed)
 Referring Provider Arcadio Knuckles, MD [address] [place],  [place] [address]   CC:  Chief Complaint  Patient presents with   Follow-up      [name] is an 58 y.o. female.  HPI: Patient is a 58 year old female with a history of a large wound to her left lower extremity. She has undergone several debridements and is here for further follow-up of her wound.   Patient was last seen in the clinic on 07/24/2023.  At this visit, patient reported occasional yellowish drainage from her wound.  She also reported generalized pain including pain in the wound in the distal wound.  On exam, patient had a 9 x 11 cm superficial wound over the left knee in the pretibial region.  The wound had excellent granulation tissue and there is no evidence of ongoing infection at time of exam.  Today, patient presents with her husband at bedside.  She reports she is doing okay, she states that she is still having similar pains to her leg as she was before.  She does state that she is seeing a new pain management provider this week.  Patient also states that she developed a scab to her lower shin after she scratched it.  She denies any other issues or concerns at this time.  Patient and her husband report that they have been doing the dressing changes as prescribed and have not tried collagen at this time.  Patient is also inquiring about when she can go ahead with ICD placement with cardiology.  I discussed with the patient and her husband that per Dr. Meridith Stanford last note, as far as placement of her ICD, that decision lies solely with the cardiology team.  He did recommend that they coordinate this decision with infectious disease.  They expressed understanding.  I recommended that they reach out to cardiology to further discuss ICD placement.  Review of Systems General: Does not report any infectious symptoms at this time  Physical Exam    08/12/2023   10:56 AM 07/29/2023    3:35 PM 07/28/2023   11:50 AM   Vitals with BMI  Height  5' 4   Weight  231 lbs   BMI  39.63   Systolic 125 104 914  Diastolic 88 64 109  Pulse 84 89 84    General:  No acute distress,  Alert and oriented, Non-Toxic, Normal speech and affect On exam, patient is sitting upright in no acute distress.  Wound is approximately 10 cm x 10 cm at its longest and widest.  There appears to be improved granulation tissue throughout the majority of the wound with some exudate throughout, and some exudate noted centrally.  There is no active drainage on exam.  No surrounding erythema.  Assessment/Plan  Wound of left lower extremity, subsequent encounter   Recommended that patient and her husband continue with dressing changes as prescribed.  I discussed with them that they would like to try collagen, they are more than welcome to.  I discussed with the husband that he may call me if he has questions about collagen application and dressing changes with the collagen.  He expressed understanding.  Recommended that patient follow back up in 3 weeks.  I instructed them to call if they have any questions or concerns in the meantime.  Pictures were obtained of the patient and placed in the chart with the patient's or guardian's permission.   Harden Leyden 08/13/2023, 8:22 AM

## 2023-08-13 DIAGNOSIS — M79652 Pain in left thigh: Secondary | ICD-10-CM | POA: Diagnosis not present

## 2023-08-13 DIAGNOSIS — G8929 Other chronic pain: Secondary | ICD-10-CM | POA: Diagnosis not present

## 2023-08-13 DIAGNOSIS — M545 Low back pain, unspecified: Secondary | ICD-10-CM | POA: Diagnosis not present

## 2023-08-13 DIAGNOSIS — M25561 Pain in right knee: Secondary | ICD-10-CM | POA: Diagnosis not present

## 2023-08-13 DIAGNOSIS — Z79899 Other long term (current) drug therapy: Secondary | ICD-10-CM | POA: Diagnosis not present

## 2023-08-14 ENCOUNTER — Telehealth: Payer: Self-pay | Admitting: *Deleted

## 2023-08-14 ENCOUNTER — Encounter (HOSPITAL_BASED_OUTPATIENT_CLINIC_OR_DEPARTMENT_OTHER): Attending: Internal Medicine | Admitting: Internal Medicine

## 2023-08-14 DIAGNOSIS — L97828 Non-pressure chronic ulcer of other part of left lower leg with other specified severity: Secondary | ICD-10-CM | POA: Diagnosis not present

## 2023-08-14 DIAGNOSIS — B998 Other infectious disease: Secondary | ICD-10-CM | POA: Diagnosis not present

## 2023-08-14 DIAGNOSIS — E11622 Type 2 diabetes mellitus with other skin ulcer: Secondary | ICD-10-CM | POA: Diagnosis not present

## 2023-08-14 DIAGNOSIS — T8140XA Infection following a procedure, unspecified, initial encounter: Secondary | ICD-10-CM | POA: Diagnosis not present

## 2023-08-14 DIAGNOSIS — I5022 Chronic systolic (congestive) heart failure: Secondary | ICD-10-CM

## 2023-08-14 DIAGNOSIS — I469 Cardiac arrest, cause unspecified: Secondary | ICD-10-CM

## 2023-08-14 DIAGNOSIS — Z8674 Personal history of sudden cardiac arrest: Secondary | ICD-10-CM | POA: Insufficient documentation

## 2023-08-14 DIAGNOSIS — T798XXA Other early complications of trauma, initial encounter: Secondary | ICD-10-CM | POA: Insufficient documentation

## 2023-08-14 DIAGNOSIS — Z01812 Encounter for preprocedural laboratory examination: Secondary | ICD-10-CM

## 2023-08-14 NOTE — Telephone Encounter (Signed)
-----   Message from [facility] sent at 07/30/2023  4:25 PM EDT ----- [employer] transvenous ICD.  Please alert the staff of the need for a dose of IV Cipro  during the procedure. ----- Message ----- From: Alvenia Aus, RN Sent: 07/29/2023   5:07 PM EDT To: Lei Pump, MD; Alvenia Aus, RN  What do you want to do, schedule? ----- Message ----- From: Jamesetta Mcbride, MD Sent: 07/25/2023  10:38 AM EDT To: Lei Pump, MD; Alvenia Aus, RN; #  I reviewed plastic surgery note and the picture of the knee wound from yesterday  From my standpoint if she'll need the icd regardless, there is not contraindication. She hasn't had any bacteremia. The risk for infection with this wound is small but there would be no study to quantify this.   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 We had decided to treat for actino though. 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   I would keep her on the amoxicillin  for actino. Has activity against e faecalis. I agree with plastic surgery that she won't need cipro  any longer for the pseudomonas at this point  Again in my mind, icd takes precedence and no contraindication for placement. Periop abx, per icd placement protocol and a dose of ciprofloxacin  750 mg iv once     Team rcid Please schedule her to follow up with us  within the next 2-3 weeks.   Thank you ----- Message ----- From: Lei Pump, MD Sent: 07/24/2023  11:26 AM EDT To: Alvenia Aus, RN; Jamesetta Mcbride, MD; #  Dr. Carolynne Citron,  Just touching base again to see what you think about her wound issues.  I think that she would benefit most from a transvenous device, but if infection continues to be an issue, subcutaneous ICD would be her option.  Do you have any thoughts on her wound?  Thank you very much for your help. ----- Message ----- From: Jamesetta Mcbride, MD Sent: 07/03/2023   4:36 PM EDT To: Lei Pump, MD; Teretha Ferguson, MD  She is in a pickle  Dr Carolynne Citron, what do you think about wound clinic for more aggressive topical care. This is all colonization now and extremely difficult to rid of pseudomonas without aggressive topical care    The transvenous icd does pose its own infectious risk after a few days   Can you do epicardial defibrillator. She is on antibiotics for suppression so I think seeding is mitigated. Not ideal but we have patient done hardware placement before  thanks ----- Message ----- From: Lei Pump, MD Sent: 07/03/2023   4:18 PM EDT To: Jamesetta Mcbride, MD; Teretha Ferguson, MD  I saw our mutual patient Ms. Finnell today.  She unfortunately needs a transvenous ICD.  For her recent cardiac arrest.  I am concerned based on what she is telling me about her lower extremity wound and how it is healing.  Today's do have any feeling about implanting this and the risk of infection with the status of her wound currently.  Do think it would be better to hold off until the wound is completely healed?  Thank you for your input.

## 2023-08-14 NOTE — Telephone Encounter (Signed)
 Left message with pt's husband to call back to schedule ICD  (Please alert the staff of the need for a dose of IV Cipro  during the procedure)

## 2023-08-15 ENCOUNTER — Other Ambulatory Visit: Payer: Self-pay

## 2023-08-15 ENCOUNTER — Ambulatory Visit: Payer: Self-pay | Admitting: Internal Medicine

## 2023-08-15 ENCOUNTER — Encounter: Payer: Self-pay | Admitting: Internal Medicine

## 2023-08-15 VITALS — BP 155/100 | HR 101 | Resp 16 | Ht 64.0 in | Wt 226.4 lb

## 2023-08-15 DIAGNOSIS — A429 Actinomycosis, unspecified: Secondary | ICD-10-CM

## 2023-08-15 DIAGNOSIS — S81002D Unspecified open wound, left knee, subsequent encounter: Secondary | ICD-10-CM

## 2023-08-15 DIAGNOSIS — B9689 Other specified bacterial agents as the cause of diseases classified elsewhere: Secondary | ICD-10-CM | POA: Diagnosis not present

## 2023-08-15 DIAGNOSIS — F333 Major depressive disorder, recurrent, severe with psychotic symptoms: Secondary | ICD-10-CM | POA: Diagnosis not present

## 2023-08-15 DIAGNOSIS — Z880 Allergy status to penicillin: Secondary | ICD-10-CM | POA: Diagnosis not present

## 2023-08-15 DIAGNOSIS — T8149XA Infection following a procedure, other surgical site, initial encounter: Secondary | ICD-10-CM | POA: Diagnosis not present

## 2023-08-15 DIAGNOSIS — I152 Hypertension secondary to endocrine disorders: Secondary | ICD-10-CM | POA: Diagnosis not present

## 2023-08-15 DIAGNOSIS — E1159 Type 2 diabetes mellitus with other circulatory complications: Secondary | ICD-10-CM | POA: Diagnosis not present

## 2023-08-15 DIAGNOSIS — L89151 Pressure ulcer of sacral region, stage 1: Secondary | ICD-10-CM | POA: Diagnosis not present

## 2023-08-15 DIAGNOSIS — B952 Enterococcus as the cause of diseases classified elsewhere: Secondary | ICD-10-CM

## 2023-08-15 DIAGNOSIS — T148XXA Other injury of unspecified body region, initial encounter: Secondary | ICD-10-CM

## 2023-08-15 MED ORDER — AMOXICILLIN 500 MG PO CAPS: 1000.0000 mg | ORAL_CAPSULE | Freq: Three times a day (TID) | ORAL | 2 refills | Status: DC

## 2023-08-15 NOTE — Progress Notes (Signed)
 Patient Active Problem List   Diagnosis Date Noted   Osteomyelitis of tibia (HCC) 07/15/2023   Actinomyces infection 04/29/2023   Pseudomonas infection 04/29/2023   Enterococcus faecalis infection 04/29/2023   Memory impairment 03/14/2023   Morbid obesity (HCC) 02/18/2023   Medication management 02/13/2023   Penicillin allergy  02/13/2023   Wound of left lower extremity 02/06/2023   S/P debridement 02/06/2023   Dysuria 01/31/2023   Encounter for palliative care involving management of pain 01/28/2023   Acute post-traumatic wound infection 01/27/2023   Nasal congestion 01/16/2023   Need for immunization against influenza 01/15/2023   Diabetes mellitus (HCC) 12/19/2022   Ventricular fibrillation (HCC) 12/10/2022   Onychomycosis 11/15/2022   Anxiety 10/27/2022   Rosacea, acne 08/13/2022   Primary osteoarthritis of both knees 03/06/2022   Bilateral ovarian cysts 03/06/2022   Major depressive disorder, recurrent episode, severe with anxious distress (HCC) 08/09/2021   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   Need for varicella vaccine 08/09/2021   Urge incontinence 08/01/2021   Tinea corporis 03/14/2021   Hyperlipidemia LDL goal <100 03/01/2021   DDD (degenerative disc disease), lumbar 08/08/2020   Non-seasonal allergic rhinitis due to pollen 07/25/2020   Chronic idiopathic constipation 07/25/2020   Visit for screening mammogram 02/11/2020   Chronic bilateral low back pain without sciatica 02/10/2020   Spondylosis without myelopathy or radiculopathy, lumbar region 12/04/2017   Type 2 diabetes mellitus with hyperglycemia (HCC)    OSA on CPAP 10/28/2014   Chronic combined systolic and diastolic heart failure (HCC) 12/06/2013   Hyperlipidemia associated with type 2 diabetes mellitus (HCC), on Zocor  04/04/2011   Hypertension associated with diabetes (HCC) 06/03/2008   Morbid obesity with BMI of 50.0-59.9, adult (HCC) 07/12/2006   NICM (nonischemic  cardiomyopathy) (HCC) 07/12/2006   Migraine without status migrainosus, not intractable 07/12/2006    Patient's Medications  New Prescriptions   No medications on file  Previous Medications   ACCU-CHEK SOFTCLIX LANCETS LANCETS    Use as instructed   AMBULATORY NON FORMULARY MEDICATION    Off-loading wheel chair seat cusion Dispense 1 Dx code: M53.3 Use as needed   ATORVASTATIN  (LIPITOR ) 80 MG TABLET    TAKE 1 TABLET BY MOUTH EVERY DAY   BLOOD GLUCOSE MONITORING SUPPL DEVI    1 each by Does not apply route in the morning, at noon, and at bedtime. May substitute to any manufacturer covered by patient's insurance.   DAPAGLIFLOZIN  PROPANEDIOL (FARXIGA ) 10 MG TABS TABLET    TAKE 1 TABLET BY MOUTH EVERY DAY   FUROSEMIDE  (LASIX ) 40 MG TABLET    Take 1 tablet (40 mg total) by mouth daily.   GLUCOSE BLOOD (BLOOD GLUCOSE TEST STRIPS) STRP    1 each by In Vitro route in the morning, at noon, and at bedtime. May substitute to any manufacturer covered by patient's insurance.   HYDROXYZINE  (ATARAX ) 25 MG TABLET    Take 1 tablet (25 mg total) by mouth 3 (three) times daily as needed.   LOSARTAN  (COZAAR ) 25 MG TABLET    Take 0.5 tablets (12.5 mg total) by mouth daily.   MAGNESIUM  OXIDE (MAG-OX) 400 MG TABLET    Take 1 tablet (400 mg total) by mouth at bedtime.   METFORMIN  (GLUCOPHAGE -XR) 500 MG 24 HR TABLET    TAKE 1 TABLET BY MOUTH 2 TIMES DAILY WITH A MEAL.   METOPROLOL  SUCCINATE (TOPROL -XL) 50 MG 24 HR TABLET    TAKE 1 TABLET BY MOUTH DAILY. TAKE WITH OR IMMEDIATELY FOLLOWING  A MEAL.   MORPHINE  (MS CONTIN ) 15 MG 12 HR TABLET    Take 1 tablet (15 mg total) by mouth every 12 (twelve) hours.   NON FORMULARY    Pt uses cpap nightly   POLYETHYLENE GLYCOL POWDER (GLYCOLAX /MIRALAX ) 17 GM/SCOOP POWDER    Take 1 capful (17 g) in water 2 (two) times daily.   POTASSIUM CHLORIDE  SA (KLOR-CON  M) 20 MEQ TABLET    TAKE 1 TABLET BY MOUTH EVERY DAY   PREGABALIN  (LYRICA ) 150 MG CAPSULE    Take 150 mg by mouth 2 (two)  times daily.   SPIRONOLACTONE  (ALDACTONE ) 25 MG TABLET    Take 0.5 tablets (12.5 mg total) by mouth daily.  Modified Medications   No medications on file  Discontinued Medications   No medications on file    Subjective: 58 yo female hx advance heart failure, dm2, recent prolonged admission 12/05/22-12/30/22 for vf cardiac arrest and left knee IO catheter that complicated with chronic wound here for f/u wound infection  03/13/23 id clinic f/u Reviewed allergy  progress note --> pcn testing shows that she doesn't have pcn allergy  Complains of nausea/heart burn with abx Leg wound improving per wound care but she still has lots of pain No diarrhea/rash otherwise   ------------- 02/28/23 id initial clinic visit with me I reviewed note from her last visit with dr Gillian Lacrosse Pcn allergy  -- urgent referral for allergy  immunology in setting of actinomyces species on wound cx. Saw dr Danae Duncans 02/27/23 and planned pcn testing/desensitization soon Ortho/plastic seeing patient  01/27/23 seen by pcp and there is a ?swab wound cx that grew pseudomonas (cipro  mic 0.5 sensitive; levo 2 resistant) 12/12 I&D of the wound by plastic -- myriad tissue matrix placed over exposed tendon. Operative cx e faecalis, actinomyces species and actinomyces europea Patient had 2 round of ciprofloxacin  12/5 and 12/13; no side effect.  She had another I&D by plastic surgery 12/30.   Patient currently taking linezolid  only and had finished cipro   I have no imaging for patient. Given the IO catheter use I worry there could be OM   I also reviewed operative note from 12/12 and 02/24/23 of dr Larraine Plush of plastic surgery 12/12: Didn't appear the debridement needs to go to the bone. Tendon exposed as mentioned above 12/30: Tissue healing but patella tendon remains exposed. I&D done and more tissue matrix placed over the tendon. No operative culture sent  She reports sharp/shooting pain in the area. No f/c. No n/v/d. No  blurry vision. No numbness/tingling Will see plastic again in a few days  Doesn't have desensitization until 1/27  Also wears life vest now waiting for icd placement which await clearance of Infection    04/08/23 id clinic f/u Patient had picc placed 2/5 and started on piptazo; amoxicillin  hold From previous discussion with plastic surgery would like to treat for pseudomonas colonization with abx in setting planned placement of graft over the exposed tendon.  She saw cardiology a few days ago for increased peripheral swelling. No sign of overt heart failure (has non-ischemic cardiomyopathy); lasix  increased to bid for a few days advised No fever, chill No n/v/diarrhea No rash  She does feel very constipated; she takes oxycodone . 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/2023   ADD (attention deficit disorder)    Anxiety    Arthritis    both knees right worse than left   Carpal tunnel syndrome of right wrist    Chronic back pain    Dysrhythmia    history of Vtach   Enterococcus faecalis infection 04/29/2023   Essential hypertension, benign    Gallstones    History of smoking 06/22/2016   Hyperlipidemia associated with type 2 diabetes mellitus (HCC), on Zocor  04/04/2011   Hypertension associated with diabetes (HCC) 06/03/2008   MDD (major depressive disorder)    Memory impairment    post cardiac arrest   Migraines    Morbid obesity (HCC)    Morbid obesity with BMI  of 50.0-59.9, adult (HCC) 07/12/2006   NICM (nonischemic cardiomyopathy) (HCC) 07/12/2006   01/16/18 ECHO:    - Procedure narrative: Transthoracic echocardiography. Image   quality was suboptimal. The study was technically difficult.   Intravenous contrast (Definity ) was administered. - Left ventricle: The cavity size was moderately dilated. Wall   thickness was increased in a pattern of mild LVH. Systolic   function was moderately to severely reduced. 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   Pseudomonas infection 04/29/2023   Spinal stenosis    back pain   Spondylosis without myelopathy or radiculopathy, lumbar region 12/04/2017   Type 2 diabetes mellitus with hyperglycemia (HCC)    Walker as ambulation aid    Past Surgical History:  Procedure Laterality Date   CHOLECYSTECTOMY     DILATATION & CURETTAGE/HYSTEROSCOPY WITH MYOSURE N/A 10/31/2017   Procedure: DILATATION & CURETTAGE/HYSTEROSCOPY WITH MYOSURE;  Surgeon: Wanita Gutta, MD;  Location: [facility];  Service: Gynecology;  Laterality: N/A;   HYSTEROSCOPY WITH D & C N/A 05/07/2022   Procedure: DILATATION AND CURETTAGE /HYSTEROSCOPY;  Surgeon: Kiki Pelton, MD;  Location: [facility] OR;  Service: Gynecology;  Laterality: N/A;   INCISION AND DRAINAGE OF WOUND Left 02/06/2023   Procedure: debridement of left leg wound, application of myriad;  Surgeon: Teretha Ferguson, MD;  Location: [facility] OR;  Service: Plastics;  Laterality: Left;   INCISION AND DRAINAGE OF WOUND Left 02/24/2023   Procedure: application of tissue replacement matrix of left patellar tendon;  Surgeon: Teretha Ferguson, MD;  Location: [facility] OR;  Service: Plastics;  Laterality: Left;   INCISION AND DRAINAGE OF WOUND Left 04/15/2023   Procedure: irrigation and debridement to left lower extremity wound;  Surgeon: Teretha Ferguson, MD;   Location: [facility] OR;  Service: Plastics;  Laterality: Left;   IRRIGATION AND DEBRIDEMENT KNEE  02/24/2023   Procedure: IRRIGATION AND DEBRIDEMENT KNEE;  Surgeon: Teretha Ferguson, MD;  Location: [facility] OR;  Service: Plastics;;   life vest     RIGHT/LEFT HEART CATH AND CORONARY ANGIOGRAPHY N/A 04/11/2016   Procedure: Right/Left Heart Cath and Coronary Angiography;  Surgeon: Odie Benne, MD;  Location: [facility] INVASIVE CV LAB;  Service: Cardiovascular;  Laterality: N/A;   RIGHT/LEFT HEART CATH AND CORONARY ANGIOGRAPHY N/A 12/09/2022   Procedure: RIGHT/LEFT HEART CATH AND CORONARY ANGIOGRAPHY;  Surgeon: Knox Perl,  MD;  Location: [facility] INVASIVE CV LAB;  Service: Cardiovascular;  Laterality: N/A;   sonogram for blood clots     no blockages     Social History   Tobacco Use   Smoking status: Former    Current packs/day: 0.00    Types: Cigarettes    Quit date: 02/25/1997    Years since quitting: 26.4   Smokeless tobacco: Never   Tobacco comments:    Married, lives with spouse (when he is not traveling) and son  Vaping Use   Vaping status: Never Used  Substance Use Topics   Alcohol use: No   Drug use: No    Family History  Problem Relation Age of Onset   Depression Mother    Anxiety disorder Mother    Diabetes Mother    Hypertension Mother    ADD / ADHD Father    Diabetes Father    Hypertension Father    Colitis Father    Hypertension Other    Diabetes Other    Colitis Other    Alcohol abuse Other     Allergies  Allergen Reactions   Fluoxetine Other (See Comments)    More depressed   Cymbalta [Duloxetine Hcl] Other (See Comments)    depressed   Pork-Derived Products Other (See Comments)    Religious beliefs- does not eat pork  Medications ok if necessary    Health Maintenance  Topic Date Due   Zoster Vaccines- Shingrix  (1 of 2) Never done   COVID-19 Vaccine (4 - 2024-25 season) 10/27/2022   MAMMOGRAM  07/09/2023   OPHTHALMOLOGY EXAM  08/09/2023   INFLUENZA VACCINE   09/26/2023   HEMOGLOBIN A1C  11/22/2023   FOOT EXAM  05/21/2024   Colonoscopy  02/11/2026   Cervical Cancer Screening (HPV/Pap Cotest)  08/02/2026   DTaP/Tdap/Td (6 - Td or Tdap) 11/08/2027   Pneumococcal Vaccine 86-80 Years old  Completed   Hepatitis C Screening  Completed   HIV Screening  Completed   HPV VACCINES  Aged Out   Meningococcal B Vaccine  Aged Out    Objective: BP (!) 155/100   Pulse (!) 101   Resp 16   Ht 5' 4 (1.626 m)   Wt 226 lb 6.4 oz (102.7 kg)   LMP  (LMP Unknown)   SpO2 99%   BMI 38.86 kg/m   Physical Exam Constitutional:      Appearance: Normal appearance. Morbidly obese  HENT:     Head: Normocephalic and atraumatic.      Mouth: Mucous membranes are moist.  Eyes:    Conjunctiva/sclera: Conjunctivae normal.     Pupils: Pupils are equal, round, and b/l symmetrical   Cardiovascular:     Rate and Rhythm: Normal rate and regular rhythm.     Heart sounds: s1s2. She has a life vest.   Pulmonary:     Effort: Pulmonary effort is normal.     Breath sounds: Normal breath sounds.   Abdominal:     General: Non distended     Palpations: soft.   Musculoskeletal:        General: sitting in the wheelchair  Skin: 02/28/23 picture from 12/30     06/09/23  From 3/31 no change really A small area over the tendon with still greenish discharge    08/15/23   Lab Results Lab Results  Component Value Date   WBC 14.4 (H) 07/15/2023   HGB 15.1 07/15/2023   HCT 47.8 (H) 07/15/2023   MCV 83.7 07/15/2023  PLT 363 07/15/2023    Lab Results  Component Value Date   CREATININE 0.83 07/15/2023   BUN 35 (H) 07/15/2023   NA 140 07/15/2023   K 4.1 07/15/2023   CL 105 07/15/2023   CO2 25 07/15/2023    Lab Results  Component Value Date   ALT 25 06/09/2023   AST 37 (H) 06/09/2023   ALKPHOS 42 12/18/2022   BILITOT 0.8 06/09/2023    Lab Results  Component Value Date   CHOL 194 08/13/2022   HDL 48.90 08/13/2022   LDLCALC 117 (H) 08/13/2022    LDLDIRECT 143.8 04/03/2011   TRIG 208 (H) 12/06/2022   CHOLHDL 4 08/13/2022      Component Value Date/Time   CRP 4.3 07/15/2023 1133   CRP 10.7 (H) 06/09/2023 1453   CRP 19.3 (H) 02/28/2023 1139     Imaging: Reviewed  11/2022 tte  1. Left ventricular ejection fraction, by estimation, is 30 to 35%. The  left ventricle has moderately decreased function. The left ventricle  demonstrates global hypokinesis. There is mild left ventricular  hypertrophy. Indeterminate diastolic filling due   to E-A fusion.   2. Right ventricular systolic function is normal. The right ventricular  size is normal.   3. The mitral valve is normal in structure. Trivial mitral valve  regurgitation.   4. The aortic valve is tricuspid. Aortic valve regurgitation is not  visualized.   5. The inferior vena cava IVC not well visualized.   Comparison(s): No significant change from prior study.       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  Culture: -01/27/23 is a clinic superficial swab --> pseudomonas (S cipro ; R levo) -- I do not know the significance of this superficial cx (open wound is often colonized and pseudomonas usually do not play a pathogenic role) -12/12 I&D culture E faecalis, Actinomyces spp, Actinomyces europaea.   Tx course: 12/12 I&D and matrix application to tendon 12/30 another matrix application -- wound looks healthy otherwise Abx: Finished 2 courses of cipro  7 day at a time by 12/17 Zyvox  started 12/19   -continue zyvox  for now in setting of actinomyces/e faecalis (the latter might not be pathogenic either). Doxy possible if needs bridging but want to start amoxicillin  -surgery the heavy lifting has been done but would like to treat with amoxicillin  for at least 3 months in setting actinomyces found -labs today -f/u 2  weeks -chart sent to dr Eudelia Hero requesting sooner pcn testing/desensitization if needed   # Penicillin allergy   Saw allergy  and planned desensitization/testing in a few weeks  -would like to see if this can be done sooner say in 1-2 weeks and I'll send message to dr Eudelia Hero today (so ICD can be considered and less toxic antibiotics can be used)     # Chronic HFrEF/NICM, VF Cardiac arrest  Fu with cardiology  Plan for ICD once left leg wound heals  -from id standpoint today, agree healed wound would be least risky given portal of entry. But if more urgent icd placement needed, I am ok with placing it once she is on amoxicillin  (this would make it easier for hospital admission to transition to iv penicillin, and also by the time we transition her to penicillin, will know for sure the insignificance of pseudomonas found on superficial tissue culture   03/13/23 id clinic assessment  She still wears life vest The wound looks clean from 1/13 and healthy granulating tissue No evidence pcn allergy  based on oral challenge and skin prick test by allergy  clinic  Stop zyvox  today Start amox 750 mg tid; plan at least 3-6 months  She doesn't want iv abx  F/u 4 weeks for labs  F/u plastic surgery    04/08/23 id clinic assessment Patient has 04/15/23 planned grafting of the exposed tendon Exam suggest decreased burden of pseudomonas colonization after a week of piptazo Advised increased volume retention anticipated with her chf and piptazo F/u cardiology as needed Daily weight. Aim for stable weight (after stool catharsis) and use extra dose lasix  as needed if daily weight increases by more than 1-2 pounds Continue piptazo Plan at least 2 weeks piptazo beyond surgical grafting for pseudomonas, but the actinomyces will again need several months treatment and likely will transition back to amoxicillin  abx    06/09/23 id assessment Based on 05/26/23 wound cx, there is still  pseduomonas Concern is that the piptazo mic is hidden which usually suggest resistance   To circle back to the beginning, this is mainly a colonization process rather than infection and given that it's pseudomonas, I believe she can benefit from topical wound care to more quickly rid of the pseudomonas  If we keep on antibiotics alone without aggressive wound care I think we'll blow through antibiotics rather quickly in terms of resistance development  Will send this concern to dr Carolynne Citron and defer wound referral to him  She request abx pill be changed to liquid  -cipro  750 bid liquid -- duration I would like to treat 2 weeks beyond skin closure/graft -stop augmentin  -continue amox high dose 3 times a day for actinomyces -- duration probably planned to stop end of 07/2023   08/15/23 id clinic assessment Pending icd placement and I had sent inbasket chat with cards/plastic surgery Just started to see wound care 6/19 The wound size has been stable -- no tendon further visualized as soft tissue growing over No planned skin graft Complains of constipation and I advised pcp f/u for this  She is 6 months into actinomyces treatment and had stopped cipro  for pseudomonas colonization a few weeks prior to this visit  I discuss to stop amoxicillin  but until after icd is placed  F/u 2 weeks after icd placement Continue wound care visit and amoxicillin   She asked about mri which I do not feel the need for. There could be some reactive change but wont' change my management and I wouldn't pursue any bone biopsy/diagnostics  Refill the amoxicillin  in pill form at family's request  Labs next visit (07/15/23 crp normal)  Jamesetta Mcbride, MD [facility] 08/15/2023, 10:54 AM

## 2023-08-15 NOTE — Telephone Encounter (Signed)
 Pt is calling back and ask you give her a call back

## 2023-08-15 NOTE — Telephone Encounter (Signed)
 ICD implant scheduled for 9/10, aware office will contact her closer to procedure date and send procedure instructions via mychart.  Patient verbalized understanding and agreeable to plan.

## 2023-08-15 NOTE — Patient Instructions (Signed)
 Call us  to arrange follow up once you have icd placment scheduled (we'll plan 2 weeks after icd placement to see you)   Continue amoxillin  No more ciprofloxacin  as discussed previously  Continue wound care

## 2023-08-18 ENCOUNTER — Ambulatory Visit: Payer: Self-pay

## 2023-08-18 ENCOUNTER — Encounter: Payer: Self-pay | Admitting: Internal Medicine

## 2023-08-18 ENCOUNTER — Ambulatory Visit (INDEPENDENT_AMBULATORY_CARE_PROVIDER_SITE_OTHER): Admitting: Internal Medicine

## 2023-08-18 VITALS — BP 110/68 | HR 80 | Temp 98.2°F | Ht 64.0 in | Wt 229.0 lb

## 2023-08-18 DIAGNOSIS — R300 Dysuria: Secondary | ICD-10-CM | POA: Diagnosis not present

## 2023-08-18 DIAGNOSIS — B3731 Acute candidiasis of vulva and vagina: Secondary | ICD-10-CM | POA: Insufficient documentation

## 2023-08-18 DIAGNOSIS — R42 Dizziness and giddiness: Secondary | ICD-10-CM | POA: Insufficient documentation

## 2023-08-18 MED ORDER — SULFAMETHOXAZOLE-TRIMETHOPRIM 800-160 MG PO TABS: 1.0000 | ORAL_TABLET | Freq: Two times a day (BID) | ORAL | 0 refills | Status: DC

## 2023-08-18 MED ORDER — MECLIZINE HCL 12.5 MG PO TABS: 12.5000 mg | ORAL_TABLET | Freq: Three times a day (TID) | ORAL | 1 refills | Status: AC | PRN

## 2023-08-18 MED ORDER — FLUCONAZOLE 150 MG PO TABS: ORAL_TABLET | ORAL | 2 refills | Status: DC

## 2023-08-18 NOTE — Patient Instructions (Signed)
 Please take all new medication as prescribed - the Septra  DS for the bladder infection, and meclizine  as needed for dizziness, and diflucan  as needed for yeast  Please continue all other medications as before, and refills have been done if requested.  Please have the pharmacy call with any other refills you may need.  Please keep your appointments with your specialists as you may have planned  Please go to the LAB at the blood drawing area for the tests to be done - just the urine testing today  You will be contacted by phone if any changes need to be made immediately.  Otherwise, you will receive a letter about your results with an explanation, but please check with MyChart first.

## 2023-08-18 NOTE — Assessment & Plan Note (Signed)
 Mild to mod, for antibx course diflucan  asd,  to f/u any worsening symptoms or concerns

## 2023-08-18 NOTE — Progress Notes (Signed)
 Patient ID: [name], female   DOB: 12-01-1965, 58 y.o.   MRN: [id number]        Chief Complaint: follow up dysuria, yeast vaginitis ,  vertigo,        HPI:  [name] is a 58 y.o. female here with c/o 3 days onset mild to mod dysuria and several small gross hemturia, but Denies urinary symptoms such as dysuria, frequency, urgency, flank pain, or n/v, fever, chills.  Pt denies chest pain, increased sob or doe, wheezing, orthopnea, PND, increased LE swelling, palpitations, or syncope but also with wrosening allergy  symptoms and mild recurrent vertigo, last 2 days over the past weekend, but none today  Also with worsening yeast vaginal symptoms.         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/2023   ADD (attention deficit disorder)    Anxiety    Arthritis    both knees right worse than left   Carpal tunnel syndrome of right wrist    Chronic back pain    Dysrhythmia    history of Vtach   Enterococcus faecalis infection 04/29/2023   Essential hypertension, benign    Gallstones    History of smoking 06/22/2016   Hyperlipidemia associated with type 2 diabetes mellitus (HCC), on Zocor  04/04/2011   Hypertension associated with diabetes (HCC) 06/03/2008   MDD (major depressive disorder)    Memory impairment    post cardiac arrest   Migraines    Morbid obesity (HCC)    Morbid obesity with BMI of 50.0-59.9, adult (HCC) 07/12/2006   NICM (nonischemic cardiomyopathy) (HCC) 07/12/2006   01/16/18 ECHO:    - Procedure narrative: Transthoracic echocardiography. Image   quality was suboptimal. The study was technically difficult.   Intravenous contrast (Definity ) was administered. - Left ventricle: The cavity size was moderately  dilated. Wall   thickness was increased in a pattern of mild LVH. Systolic   function was moderately to severely reduced. 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   Pseudomonas infection 04/29/2023   Spinal stenosis    back pain   Spondylosis without myelopathy or radiculopathy, lumbar region 12/04/2017   Type 2 diabetes mellitus with hyperglycemia (HCC)    Walker as ambulation aid    Past Surgical History:  Procedure Laterality Date   CHOLECYSTECTOMY     DILATATION & CURETTAGE/HYSTEROSCOPY WITH MYOSURE N/A 10/31/2017   Procedure: DILATATION & CURETTAGE/HYSTEROSCOPY WITH MYOSURE;  Surgeon: Jannis Kate Norris, MD;  Location: [facility];  Service: Gynecology;  Laterality: N/A;   HYSTEROSCOPY WITH D & C N/A 05/07/2022   Procedure: DILATATION AND CURETTAGE /HYSTEROSCOPY;  Surgeon: Jeralyn Crutch, MD;  Location: [facility] OR;  Service: Gynecology;  Laterality: N/A;   INCISION AND DRAINAGE OF WOUND Left 02/06/2023   Procedure: debridement of left leg wound, application of myriad;  Surgeon: Waddell Leonce NOVAK, MD;  Location: [facility] OR;  Service: Plastics;  Laterality: Left;   INCISION AND DRAINAGE OF WOUND Left 02/24/2023   Procedure: application of tissue replacement matrix of left  patellar tendon;  Surgeon: Waddell Leonce NOVAK, MD;  Location: [facility] OR;  Service: Plastics;  Laterality: Left;   INCISION AND DRAINAGE OF WOUND Left 04/15/2023   Procedure: irrigation and debridement to left lower extremity wound;  Surgeon: Waddell Leonce NOVAK, MD;  Location: [facility] OR;  Service: Plastics;  Laterality: Left;   IRRIGATION AND DEBRIDEMENT KNEE  02/24/2023   Procedure: IRRIGATION AND DEBRIDEMENT KNEE;  Surgeon: Waddell Leonce NOVAK, MD;  Location: [facility] OR;  Service: Plastics;;   life vest     RIGHT/LEFT HEART CATH AND CORONARY ANGIOGRAPHY N/A 04/11/2016   Procedure: Right/Left Heart  Cath and Coronary Angiography;  Surgeon: Lonni JONETTA Cash, MD;  Location: [facility] INVASIVE CV LAB;  Service: Cardiovascular;  Laterality: N/A;   RIGHT/LEFT HEART CATH AND CORONARY ANGIOGRAPHY N/A 12/09/2022   Procedure: RIGHT/LEFT HEART CATH AND CORONARY ANGIOGRAPHY;  Surgeon: Ladona Heinz, MD;  Location: [facility] INVASIVE CV LAB;  Service: Cardiovascular;  Laterality: N/A;   sonogram for blood clots     no blockages    reports that she quit smoking about 26 years ago. Her smoking use included cigarettes. She has never used smokeless tobacco. She reports that she does not drink alcohol and does not use drugs. family history includes ADD / ADHD in her father; Alcohol abuse in an other family member; Anxiety disorder in her mother; Colitis in her father and another family member; Depression in her mother; Diabetes in her father, mother, and another family member; Hypertension in her father, mother, and another family member. Allergies  Allergen Reactions   Fluoxetine Other (See Comments)    More depressed   Cymbalta [Duloxetine Hcl] Other (See Comments)    depressed   Pork-Derived Products Other (See Comments)    Religious beliefs- does not eat pork  Medications ok if necessary   Current Outpatient Medications on File Prior to Visit  Medication Sig Dispense Refill   Accu-Chek Softclix Lancets lancets Use as instructed 100 each 12   AMBULATORY NON FORMULARY MEDICATION Off-loading wheel chair seat cusion Dispense 1 Dx code: M53.3 Use as needed 1 Units 0   amoxicillin  (AMOXIL ) 500 MG capsule Take 2 capsules (1,000 mg total) by mouth 3 (three) times daily. 180 capsule 2   atorvastatin  (LIPITOR ) 80 MG tablet TAKE 1 TABLET BY MOUTH EVERY DAY 90 tablet 0   Blood Glucose Monitoring Suppl DEVI 1 each by Does not apply route in the morning, at noon, and at bedtime. May substitute to any manufacturer covered by patient's insurance. 1 each 0   dapagliflozin  propanediol (FARXIGA ) 10 MG TABS tablet TAKE 1  TABLET BY MOUTH EVERY DAY 90 tablet 3   furosemide  (LASIX ) 40 MG tablet Take 1 tablet (40 mg total) by mouth daily. 90 tablet 3   gentamicin ointment (GARAMYCIN) 0.1 % Apply topically at bedtime.     Glucose Blood (BLOOD GLUCOSE TEST STRIPS) STRP 1 each by In Vitro route in the morning, at noon, and at bedtime. May substitute to any manufacturer covered by patient's insurance. 100 strip 11   hydrOXYzine  (ATARAX ) 25 MG tablet Take 1 tablet (25 mg total) by mouth 3 (three) times daily as needed. 270 tablet 0   losartan  (COZAAR ) 25 MG tablet Take 0.5 tablets (12.5 mg total) by mouth daily.     magnesium  oxide (MAG-OX) 400 MG tablet Take 1 tablet (400 mg total) by mouth at bedtime. 90 tablet 1   metFORMIN  (GLUCOPHAGE -XR) 500 MG 24 hr tablet TAKE 1 TABLET BY MOUTH 2 TIMES DAILY WITH A  MEAL. 180 tablet 1   metoprolol  succinate (TOPROL -XL) 50 MG 24 hr tablet TAKE 1 TABLET BY MOUTH DAILY. TAKE WITH OR IMMEDIATELY FOLLOWING A MEAL. 90 tablet 3   morphine  (MS CONTIN ) 15 MG 12 hr tablet Take 1 tablet (15 mg total) by mouth every 12 (twelve) hours. 60 tablet 0   NON FORMULARY Pt uses cpap nightly     polyethylene glycol powder (GLYCOLAX /MIRALAX ) 17 GM/SCOOP powder Take 1 capful (17 g) in water 2 (two) times daily. 765 g 0   potassium chloride  SA (KLOR-CON  M) 20 MEQ tablet TAKE 1 TABLET BY MOUTH EVERY DAY 90 tablet 3   pregabalin  (LYRICA ) 150 MG capsule Take 150 mg by mouth 2 (two) times daily.     spironolactone  (ALDACTONE ) 25 MG tablet Take 0.5 tablets (12.5 mg total) by mouth daily. 90 tablet 1   No current facility-administered medications on file prior to visit.        ROS:  All others reviewed and negative.  Objective        PE:  BP 110/68 (BP Location: Left Arm, Patient Position: Sitting, Cuff Size: Normal)   Pulse 80   Temp 98.2 F (36.8 C) (Oral)   Ht 5' 4 (1.626 m)   Wt 229 lb (103.9 kg)   LMP  (LMP Unknown)   SpO2 99%   BMI 39.31 kg/m                 Constitutional: Pt appears in  NAD               HENT: Head: NCAT.                Right Ear: External ear normal.                 Left Ear: External ear normal.                Eyes: . Pupils are equal, round, and reactive to light. Conjunctivae and EOM are normal               Nose: without d/c or deformity               Neck: Neck supple. Gross normal ROM               Cardiovascular: Normal rate and regular rhythm.                 Pulmonary/Chest: Effort normal and breath sounds without rales or wheezing.                Abd:  Soft, NT, ND, + BS, no organomegaly               Neurological: Pt is alert. At baseline orientation, motor grossly intact               Skin: Skin is warm. No rashes, no other new lesions, LE edema - none               Psychiatric: Pt behavior is normal without agitation   Micro: none  Cardiac tracings I have personally interpreted today:  none  Pertinent Radiological findings (summarize): none   Lab Results  Component Value Date   WBC 14.4 (H) 07/15/2023   HGB 15.1 07/15/2023   HCT 47.8 (H) 07/15/2023   PLT 363 07/15/2023   GLUCOSE 112 (H) 07/15/2023   CHOL 194 08/13/2022   TRIG 208 (H) 12/06/2022   HDL 48.90 08/13/2022  LDLDIRECT 143.8 04/03/2011   LDLCALC 117 (H) 08/13/2022   ALT 25 06/09/2023   AST 37 (H) 06/09/2023   NA 140 07/15/2023   K 4.1 07/15/2023   CL 105 07/15/2023   CREATININE 0.83 07/15/2023   BUN 35 (H) 07/15/2023   CO2 25 07/15/2023   TSH 4.29 01/15/2023   INR 1.2 02/25/2022   HGBA1C 5.4 05/22/2023   MICROALBUR 1.0 05/22/2023   Assessment/Plan:  [name] is a 58 y.o. White or Caucasian [1] female with  has a past medical history of Actinomyces infection (04/29/2023), ADD (attention deficit disorder), Anxiety, Arthritis, Carpal tunnel syndrome of right wrist, Chronic back pain, Dysrhythmia, Enterococcus faecalis infection (04/29/2023), Essential hypertension, benign, Gallstones, History of smoking (06/22/2016), Hyperlipidemia associated with type 2  diabetes mellitus (HCC), on Zocor  (04/04/2011), Hypertension associated with diabetes (HCC) (06/03/2008), MDD (major depressive disorder), Memory impairment, Migraines, Morbid obesity (HCC), Morbid obesity with BMI of 50.0-59.9, adult (HCC) (07/12/2006), NICM (nonischemic cardiomyopathy) (HCC) (07/12/2006), Normal coronary arteries (04/17/2016), OSA on CPAP (10/28/2014), Osteomyelitis of tibia (HCC) (07/15/2023), Pseudomonas infection (04/29/2023), Spinal stenosis, Spondylosis without myelopathy or radiculopathy, lumbar region (12/04/2017), Type 2 diabetes mellitus with hyperglycemia (HCC), and Walker as ambulation aid.  Dysuria With gross hematuria, high suspcion for uti - for septra  ds bid course, ua and culture,  to f/u any worsening symptoms or concerns  Yeast vaginitis Mild to mod, for antibx course diflucan  asd,  to f/u any worsening symptoms or concerns  Vertigo Mild to mod, for meclizine  12.5 mg prn,  to f/u any worsening symptoms or concerns  Followup: Return if symptoms worsen or fail to improve.  Lynwood Rush, MD 08/18/2023 8:52 PM [facility]

## 2023-08-18 NOTE — Assessment & Plan Note (Signed)
 With gross hematuria, high suspcion for uti - for septra  ds bid course, ua and culture,  to f/u any worsening symptoms or concerns

## 2023-08-18 NOTE — Telephone Encounter (Signed)
 FYI Only or Action Required?: FYI only for provider.  Patient was last seen in primary care on 05/22/2023 by Joshua Debby CROME, MD. Called Nurse Triage reporting Hematuria. Symptoms began yesterday. Interventions attempted: Nothing. Symptoms are: Blood in urine, States she has a yeast infection, constipation gradually worsening.  Triage Disposition: No disposition on file.  Patient/caregiver understands and will follow disposition?: Yes                Copied from CRM [id number]. Topic: Clinical - Red Word Triage >> Aug 18, 2023 11:47 AM Precious C wrote: [facility] that prompted transfer to Nurse Triage: BLOOD IN URINE/ pt husband called in because pt has uti, yeast infection and has blood in urine. Reason for Disposition . Blood in urine  (Exception: Could be normal menstrual bleeding.)  Answer Assessment - Initial Assessment Questions 1. COLOR of URINE: Describe the color of the urine.  (e.g., tea-colored, pink, red, bloody) Do you have blood clots in your urine? (e.g., none, pea, grape, small coin)     Pink now 2. ONSET: When did the bleeding start?      yesterday 7. OTHER SYMPTOMS: Do you have any other symptoms? (e.g., back/flank pain, abdomen pain, vomiting)     Constipation, Yeast infection  Protocols used: Urine - Blood In-A-AH

## 2023-08-18 NOTE — Assessment & Plan Note (Signed)
 Mild to mod, for meclizine  12.5 mg prn,  to f/u any worsening symptoms or concerns

## 2023-08-19 ENCOUNTER — Ambulatory Visit: Payer: Self-pay | Admitting: Internal Medicine

## 2023-08-19 ENCOUNTER — Ambulatory Visit (INDEPENDENT_AMBULATORY_CARE_PROVIDER_SITE_OTHER): Admitting: Family Medicine

## 2023-08-19 VITALS — BP 110/68 | HR 85 | Ht 64.0 in | Wt 229.0 lb

## 2023-08-19 DIAGNOSIS — M545 Low back pain, unspecified: Secondary | ICD-10-CM

## 2023-08-19 DIAGNOSIS — G8929 Other chronic pain: Secondary | ICD-10-CM

## 2023-08-19 DIAGNOSIS — M47816 Spondylosis without myelopathy or radiculopathy, lumbar region: Secondary | ICD-10-CM | POA: Diagnosis not present

## 2023-08-19 LAB — URINALYSIS, ROUTINE W REFLEX MICROSCOPIC
Bilirubin Urine: NEGATIVE
Ketones, ur: NEGATIVE
Leukocytes,Ua: NEGATIVE
Nitrite: NEGATIVE
Specific Gravity, Urine: 1.015 (ref 1.000–1.030)
Total Protein, Urine: NEGATIVE
Urine Glucose: >=1000 — AB
Urobilinogen, UA: 0.2 (ref 0.0–1.0)
pH: 6 (ref 5.0–8.0)

## 2023-08-19 MED ORDER — LORAZEPAM 0.5 MG PO TABS: ORAL_TABLET | ORAL | 0 refills | Status: DC

## 2023-08-19 NOTE — Progress Notes (Signed)
   Samantha Ileana Collet, PhD, LAT, ATC acting as a scribe for [name], MD.  [name] is a 58 y.o. female who presents to [facility] today for back pain. Pt was last seen by Dr. Lloyd on 07/09/23 for L shoulder and R knee pain. Last visit for LBP and lumbar radiculopathy was on 03/25/23. Facet injections on 07/28/23.  Today, pt c/o LBP she is feeling pain more on the L-side of her low back. Knee and shoulder pain have improved greatly. No radicular pain.   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  History of diabetes.  History of left anterior knee skin ischemia and breakdown improving with wound care   Exam:  BP 110/68   Pulse 85   Ht 5' 4 (1.626 m)   Wt 229 lb (103.9 kg)   LMP  (LMP Unknown)   SpO2 99%   BMI 39.31 kg/m  General: Well Developed, well nourished, and in no acute distress.   MSK: L-spine nontender palpation midline tender palpation left paraspinal musculature lower portion lumbar spine    Assessment and Plan: 58 y.o. female with chronic low back pain.  Patient did have right-sided low back pain that was well treated with facet injections starting in the right L3-4 facet joint and right L4-5 facet joint.  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  This pain is progressing and not improving with physical therapy.  Plan for facet injection targeting left L4-5 and L5-S1.  Additionally her MRI is now 58 year old.  Plan for updated MRI.  She is scheduled to have a ICD implanted on September 1.  Try to get this MRI before then would be helpful as it would drastically simplify MRI.   PDMP reviewed during this encounter. Orders Placed This Encounter  Procedures   DG INJECT DIAG/THERA/INC NEEDLE/CATH/PLC EPI/LUMB/SAC W/IMG    Level and technique  per radiology    Standing Status:   Future    Expiration Date:   09/18/2023    Reason for Exam (SYMPTOM  OR DIAGNOSIS REQUIRED):   Low back pain, L4-L5, L5-S1    Preferred Imaging Location?:   [facility]    Is the patient pregnant?:   No   MR LUMBAR SPINE WO CONTRAST    Standing Status:   Future    Expiration Date:   09/18/2023    What is the patient's sedation requirement?:   No Sedation    Does the patient have a pacemaker or implanted devices?:   No    Preferred imaging location?:   [facility] (table limit-550lbs)   Meds ordered this encounter  Medications   LORazepam  (ATIVAN ) 0.5 MG tablet    Sig: 1-2 tabs 30 - 60 min prior to MRI. Do not drive with this medicine.    Dispense:  4 tablet    Refill:  0     Discussed warning signs or symptoms. Please see discharge instructions. Patient expresses understanding.   The above documentation has been reviewed and is accurate and complete [name], M.D.

## 2023-08-19 NOTE — Patient Instructions (Signed)
 Thank you for coming in today.   We've placed an order for a back injection, [facility] will reach out to assist with scheduling after obtained authorization for the procedure with your insurance company.   We are ordering an MRI for you today.  The imaging office will be calling you to schedule your appointment after we obtain authorization from your insurance company.  If you have not heard from [facility] within a week, please call [phone] to schedule your MRI.   Please be sure you have signed up for MyChart so that we can get your results to you.  We will be in touch with you as soon as we can.  Please know, it can take up to 3-4 business days for the radiologist and Dr. Joane to have time to review the results and determine the best plan of care.  If there is something that appears to be surgical or needs a referral to other specialists we will let you know through MyChart or telephone.  Otherwise we will plan to schedule a follow up appointment with Dr. Joane once we have the results.

## 2023-08-20 ENCOUNTER — Other Ambulatory Visit: Payer: Self-pay | Admitting: Internal Medicine

## 2023-08-20 LAB — URINE CULTURE

## 2023-08-20 MED ORDER — NITROFURANTOIN MACROCRYSTAL 50 MG PO CAPS: 50.0000 mg | ORAL_CAPSULE | Freq: Four times a day (QID) | ORAL | 0 refills | Status: DC

## 2023-08-21 ENCOUNTER — Encounter (HOSPITAL_BASED_OUTPATIENT_CLINIC_OR_DEPARTMENT_OTHER): Admitting: Internal Medicine

## 2023-08-21 DIAGNOSIS — E11622 Type 2 diabetes mellitus with other skin ulcer: Secondary | ICD-10-CM | POA: Diagnosis not present

## 2023-08-21 DIAGNOSIS — T798XXA Other early complications of trauma, initial encounter: Secondary | ICD-10-CM | POA: Diagnosis not present

## 2023-08-21 DIAGNOSIS — T8140XA Infection following a procedure, unspecified, initial encounter: Secondary | ICD-10-CM | POA: Diagnosis not present

## 2023-08-21 DIAGNOSIS — L97828 Non-pressure chronic ulcer of other part of left lower leg with other specified severity: Secondary | ICD-10-CM

## 2023-08-21 DIAGNOSIS — B998 Other infectious disease: Secondary | ICD-10-CM | POA: Diagnosis not present

## 2023-08-21 DIAGNOSIS — Z8674 Personal history of sudden cardiac arrest: Secondary | ICD-10-CM | POA: Diagnosis not present

## 2023-08-22 DIAGNOSIS — F333 Major depressive disorder, recurrent, severe with psychotic symptoms: Secondary | ICD-10-CM | POA: Diagnosis not present

## 2023-08-22 DIAGNOSIS — B9689 Other specified bacterial agents as the cause of diseases classified elsewhere: Secondary | ICD-10-CM | POA: Diagnosis not present

## 2023-08-22 DIAGNOSIS — L89151 Pressure ulcer of sacral region, stage 1: Secondary | ICD-10-CM | POA: Diagnosis not present

## 2023-08-22 DIAGNOSIS — T8149XA Infection following a procedure, other surgical site, initial encounter: Secondary | ICD-10-CM | POA: Diagnosis not present

## 2023-08-22 DIAGNOSIS — I152 Hypertension secondary to endocrine disorders: Secondary | ICD-10-CM | POA: Diagnosis not present

## 2023-08-22 DIAGNOSIS — E1159 Type 2 diabetes mellitus with other circulatory complications: Secondary | ICD-10-CM | POA: Diagnosis not present

## 2023-08-26 ENCOUNTER — Other Ambulatory Visit: Payer: Self-pay

## 2023-08-26 ENCOUNTER — Other Ambulatory Visit: Payer: Self-pay | Admitting: Licensed Clinical Social Worker

## 2023-08-26 DIAGNOSIS — B9689 Other specified bacterial agents as the cause of diseases classified elsewhere: Secondary | ICD-10-CM | POA: Diagnosis not present

## 2023-08-26 DIAGNOSIS — F333 Major depressive disorder, recurrent, severe with psychotic symptoms: Secondary | ICD-10-CM | POA: Diagnosis not present

## 2023-08-26 DIAGNOSIS — E1159 Type 2 diabetes mellitus with other circulatory complications: Secondary | ICD-10-CM | POA: Diagnosis not present

## 2023-08-26 DIAGNOSIS — T8149XA Infection following a procedure, other surgical site, initial encounter: Secondary | ICD-10-CM | POA: Diagnosis not present

## 2023-08-26 DIAGNOSIS — L89151 Pressure ulcer of sacral region, stage 1: Secondary | ICD-10-CM | POA: Diagnosis not present

## 2023-08-26 DIAGNOSIS — I152 Hypertension secondary to endocrine disorders: Secondary | ICD-10-CM | POA: Diagnosis not present

## 2023-08-26 NOTE — Patient Instructions (Signed)
 Visit Information  Thank you for taking time to visit with me today. Please don't hesitate to contact me if I can be of assistance to you before our next scheduled appointment.  Our next appointment is by telephone on 09/29/23 at 3:00 PM    Please call the care guide team at [phone] if you need to cancel or reschedule your appointment.   Following is a copy of your care plan:   Goals Addressed             This Visit's Progress    VBCI Social Work Care Plan       Problems:   Mobility issues            Skin care issues             Anxiety issues             Nursing needs             Mental health  needs (sees counselor at [facility].)             Depression issues             Some financial challenges  CSW Clinical Goal(s):   Over the next 30  days the Patient will attend scheduled medical appointments and mental health appointmentsAEB patient report and as documented in EPIC record             Over the next 30 days, client will use coping skills to manage depression issues faced AEB client report of reduction in depression symptoms.  Interventions:  Discussed client pain issues. Takes pain medication as prescribed             Discussed client transport needs. Client receives transport help from her spouse            Discussed skin care needs of client. Discussed nurse support for client in the home.  Nurse from [facility] visits client in home one time weekly            Discussed client hobbies:  she said she watches TV a good bit. She said she has decreased energy            Discussed mental health support. She has met with mental health counselor 2 times (per patient). Counselor is at [facility]            Client has decreased activities. She said she rests on the couch a good bit             Client has sleep difficulty. She spoke of neuropathy issues.                Spoke of program support with RN, LCSW, Pharmacist              Spoke of  financial challenges. Spoke of higher food costs. She receives Food Stamps benefit              Client spoke of diurectic use.                Discussed client vision.  She said she uses reading glasses               Discussed ambulation of client. She uses a cane or walker as needed              Discussed physical therapy support for client in the home.  Client sees Infectious Disease provider as scheduled               Encouraged client to call LCSW as needed for SW support              Discussed memory issues of client. She is concerned about some memory loss                           Patient Goals/Self-Care Activities:  Client to attend scheduled medical appointments             Client to take medications as prescribed              Client to use coping skills to manage depression issues faced              Client to call LCSW as needed for SW support              Client to participate in scheduled mental health appointments              Plan:   LCSW to call client on September 29, 2023 at 3:00 PM         Please go to [facility] [address] [place] [phone]) if you are experiencing a Mental Health or Behavioral Health Crisis or need someone to talk to.  The patient verbalized understanding of instructions, educational materials, and care plan provided today and DECLINED offer to receive copy of patient instructions, educational materials, and care plan.    [name]  MSW, LCSW [place] Licensed Clinical Social Worker Direct Dial:  [phone] Fax:  [phone] Website:  [email or web address]

## 2023-08-26 NOTE — Patient Outreach (Signed)
 Complex Care Management   Visit Note  08/26/2023  Name:  [name] MRN: [id number] DOB: 1965-09-16  Situation: Referral received for Complex Care Management related to depression and management of health needs I obtained verbal consent from Patient.  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/2023   ADD (attention deficit disorder)    Anxiety    Arthritis    both knees right worse than left   Carpal tunnel syndrome of right wrist    Chronic back pain    Dysrhythmia    history of Vtach   Enterococcus faecalis infection 04/29/2023   Essential hypertension, benign    Gallstones    History of smoking 06/22/2016   Hyperlipidemia associated with type 2 diabetes mellitus (HCC), on Zocor  04/04/2011   Hypertension associated with diabetes (HCC) 06/03/2008   MDD (major depressive disorder)    Memory impairment    post cardiac arrest   Migraines    Morbid obesity (HCC)    Morbid obesity with BMI of 50.0-59.9, adult (HCC) 07/12/2006   NICM (nonischemic cardiomyopathy) (HCC) 07/12/2006   01/16/18 ECHO:    - Procedure narrative: Transthoracic echocardiography. Image   quality was suboptimal. The study was technically difficult.   Intravenous contrast (Definity ) was administered. - Left ventricle: The cavity size was moderately dilated. Wall   thickness was increased in a pattern of mild LVH. Systolic   function was moderately to severely reduced. 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   Pseudomonas infection 04/29/2023   Spinal stenosis    back pain   Spondylosis without myelopathy or radiculopathy, lumbar region 12/04/2017   Type 2 diabetes mellitus with hyperglycemia (HCC)    Walker as ambulation aid     Assessment: Patient Reported  Symptoms:  Cognitive Cognitive Status: Alert and oriented to person, place, and time   Health Maintenance Behaviors: Stress management Health Facilitated by: Rest, Stress management  Neurological Neurological Review of Symptoms: Weakness, Dizziness Neurological Management Strategies: Coping strategies  HEENT HEENT Symptoms Reported:  (she said she had experienced vertigo) HEENT Management Strategies: Coping strategies, Adequate rest    Cardiovascular Cardiovascular Symptoms Reported: Dizziness, Fatigue Cardiovascular Management Strategies: Coping strategies, Adequate rest  Respiratory Respiratory Symptoms Reported: No symptoms reported Respiratory Management Strategies: Adequate rest  Endocrine Endocrine Symptoms Reported: Blurry vision, Weakness or fatigue Is patient diabetic?: Yes    Gastrointestinal Gastrointestinal Symptoms Reported: Nausea, Vomiting Additional Gastrointestinal Details: she spoke of problems with constipation Gastrointestinal Management Strategies: Adequate rest, Coping strategies Gastrointestinal Comment: she spoke of issues with constipation    Genitourinary Genitourinary Symptoms Reported: No symptoms reported Genitourinary Management Strategies: Adequate rest, Medication therapy  Integumentary Integumentary Symptoms Reported: Wound Additional Integumentary Details: wound area on left leg (slow healing) Skin Management Strategies: Adequate rest, Coping strategies, Medication therapy  Musculoskeletal Musculoskelatal Symptoms Reviewed: Difficulty walking, Muscle pain, Unsteady gait, Weakness Musculoskeletal Management Strategies: Coping strategies, Adequate rest      Psychosocial Psychosocial Symptoms Reported: Anxiety - if selected complete GAD, Sadness - if selected complete PHQ 2-9, Depression - if selected complete PHQ 2-9 Behavioral Management Strategies: Coping strategies, Adequate rest Major Change/Loss/Stressor/Fears (CP): Medical condition,  self Techniques to Cope with Loss/Stress/Change: Counseling, Medication, Diversional activities Quality of Family Relationships: supportive Do you feel physically  threatened by others?: No      08/26/2023    1:54 PM  Depression screen PHQ 2/9  Decreased Interest 1  Down, Depressed, Hopeless 1  PHQ - 2 Score 2  Altered sleeping 1  Tired, decreased energy 1  Change in appetite 1  Feeling bad or failure about yourself  1  Trouble concentrating 1  Moving slowly or fidgety/restless 1  Suicidal thoughts 0  PHQ-9 Score 8  Difficult doing work/chores Somewhat difficult    Vitals:   BP in normal range per client Medications Reviewed Today     Reviewed by Frances Ozell GORMAN KEN (Social Worker) on 08/26/23 at 1342  Med List Status: <None>   Medication Order Taking? Sig Documenting Provider Last Dose Status Informant  Accu-Chek Softclix Lancets lancets [id number] unknown Use as instructed Joshua Debby CROME, MD  Active Spouse/Significant Other  AMBULATORY NON FORMULARY MEDICATION [id number] unknown Off-loading wheel chair seat cusion Dispense 1 Dx code: M53.3 Use as needed Corey, Evan S, MD  Active Spouse/Significant Other  amoxicillin  (AMOXIL ) 500 MG capsule [id number] Yes Take 2 capsules (1,000 mg total) by mouth 3 (three) times daily. Overton Faith T, MD  Active   atorvastatin  (LIPITOR ) 80 MG tablet [id number] Yes TAKE 1 TABLET BY MOUTH EVERY DAY Joshua Debby CROME, MD  Active   Blood Glucose Monitoring Suppl DEVI [id number] unknown 1 each by Does not apply route in the morning, at noon, and at bedtime. May substitute to any manufacturer covered by patient's insurance. Elnor Lauraine BRAVO, NP  Active Spouse/Significant Other  dapagliflozin  propanediol (FARXIGA ) 10 MG TABS tablet [id number] Yes TAKE 1 TABLET BY MOUTH EVERY DAY Lavona Agent, MD  Active   fluconazole  (DIFLUCAN ) 150 MG tablet [id number] Not taking 1 tab by mouth every 3 days as needed  Patient not taking: Reported on 08/26/2023   Norleen Agent ORN, MD  Active   furosemide  (LASIX ) 40 MG tablet [id number] Yes Take 1 tablet (40 mg total) by mouth daily. Lavona Agent, MD  Active   gentamicin ointment (GARAMYCIN) 0.1 % [id number] Yes Apply topically at bedtime. [provider]  Active   Glucose Blood (BLOOD GLUCOSE TEST STRIPS) STRP [id number] unknown 1 each by In Vitro route in the morning, at noon, and at bedtime. May substitute to any manufacturer covered by patient's insurance. Elnor Lauraine BRAVO, NP  Active Spouse/Significant Other  hydrOXYzine  (ATARAX ) 25 MG tablet [id number] taking Take 1 tablet (25 mg total) by mouth 3 (three) times daily as needed. Joshua Debby CROME, MD  Active   LORazepam  (ATIVAN ) 0.5 MG tablet [id number] Yes 1-2 tabs 30 - 60 min prior to MRI. Do not drive with this medicine. Joane Artist GORMAN, MD  Active   losartan  (COZAAR ) 25 MG tablet [id number] Yes Take 0.5 tablets (12.5 mg total) by mouth daily. Meng, Hao, [place]  Active   magnesium  oxide (MAG-OX) 400 MG tablet [id number] Yes Take 1 tablet (400 mg total) by mouth at bedtime. Lavona Agent, MD  Active Spouse/Significant Other  meclizine  (ANTIVERT ) 12.5 MG tablet [id number] unknown Take 1 tablet (12.5 mg total) by mouth 3 (three) times daily as needed. Norleen Agent ORN, MD  Active   metFORMIN  (GLUCOPHAGE -XR) 500 MG 24 hr tablet [id number] Yes TAKE 1 TABLET BY MOUTH 2 TIMES DAILY WITH A MEAL. Joshua Debby CROME, MD  Active   metoprolol  succinate (TOPROL -XL) 50 MG 24 hr tablet [id number] Yes TAKE 1 TABLET BY MOUTH DAILY. TAKE WITH OR IMMEDIATELY FOLLOWING A MEAL. Lavona Agent, MD  Active  morphine  (MS CONTIN ) 15 MG 12 hr tablet [id number] Yes Take 1 tablet (15 mg total) by mouth every 12 (twelve) hours. Joshua Debby CROME, MD  Active   nitrofurantoin  (MACRODANTIN ) 50 MG capsule [id number] Yes Take 1 capsule (50 mg total) by mouth 4 (four) times daily. Norleen Lynwood ORN, MD  Active   JESSE SCHLOSSMAN [id number] unknown Pt uses cpap nightly [provider]  Active Spouse/Significant Other   polyethylene glycol powder (GLYCOLAX /MIRALAX ) 17 GM/SCOOP powder [id number] Yes Take 1 capful (17 g) in water 2 (two) times daily. Alvia Corean CROME, FNP  Active Spouse/Significant Other  potassium chloride  SA (KLOR-CON  M) 20 MEQ tablet [id number] Yes TAKE 1 TABLET BY MOUTH EVERY DAY Lavona Lynwood, MD  Active   pregabalin  (LYRICA ) 150 MG capsule [id number] Not taking Take 150 mg by mouth 2 (two) times daily.  Patient not taking: Reported on 08/26/2023   [provider]  Active   spironolactone  (ALDACTONE ) 25 MG tablet [id number] unknown Take 0.5 tablets (12.5 mg total) by mouth daily. Lavona Lynwood, MD  Active Spouse/Significant Other  sulfamethoxazole -trimethoprim  (BACTRIM  DS) 800-160 MG tablet [id number] unknown Take 1 tablet by mouth 2 (two) times daily. Norleen Lynwood ORN, MD  Active             Recommendation:   PCP Follow-up Continue Current Plan of Care Take medications as prescribed Attend medical appointments as scheduled. Attend mental health appointments as scheduled Call LCSW as needed for SW support Use coping skills to help manage depression and anxiety issues faced   Follow Up Plan:   Telephone follow up appointment date/time:  09/29/23 at 3;00 PM    [name]  MSW, LCSW [place] Licensed Clinical Social Worker Direct Dial:  [phone] Fax:  [phone] Website:  [email or web address]

## 2023-08-27 ENCOUNTER — Ambulatory Visit
Admission: RE | Admit: 2023-08-27 | Discharge: 2023-08-27 | Disposition: A | Discharge status: Home / Self Care | Source: Ambulatory Visit | Attending: Family Medicine | Admitting: Family Medicine

## 2023-08-27 DIAGNOSIS — M4726 Other spondylosis with radiculopathy, lumbar region: Secondary | ICD-10-CM | POA: Diagnosis not present

## 2023-08-27 DIAGNOSIS — M47816 Spondylosis without myelopathy or radiculopathy, lumbar region: Secondary | ICD-10-CM

## 2023-08-27 DIAGNOSIS — M545 Low back pain, unspecified: Secondary | ICD-10-CM

## 2023-08-27 DIAGNOSIS — M5127 Other intervertebral disc displacement, lumbosacral region: Secondary | ICD-10-CM | POA: Diagnosis not present

## 2023-08-28 ENCOUNTER — Ambulatory Visit: Payer: Self-pay | Admitting: Family Medicine

## 2023-08-28 DIAGNOSIS — E1159 Type 2 diabetes mellitus with other circulatory complications: Secondary | ICD-10-CM | POA: Diagnosis not present

## 2023-08-28 DIAGNOSIS — L89151 Pressure ulcer of sacral region, stage 1: Secondary | ICD-10-CM | POA: Diagnosis not present

## 2023-08-28 DIAGNOSIS — B9689 Other specified bacterial agents as the cause of diseases classified elsewhere: Secondary | ICD-10-CM | POA: Diagnosis not present

## 2023-08-28 DIAGNOSIS — F333 Major depressive disorder, recurrent, severe with psychotic symptoms: Secondary | ICD-10-CM | POA: Diagnosis not present

## 2023-08-28 DIAGNOSIS — T8149XA Infection following a procedure, other surgical site, initial encounter: Secondary | ICD-10-CM | POA: Diagnosis not present

## 2023-08-28 DIAGNOSIS — I152 Hypertension secondary to endocrine disorders: Secondary | ICD-10-CM | POA: Diagnosis not present

## 2023-08-28 NOTE — Progress Notes (Signed)
 Lumbar spine MRI shows a pinched nerve causing left leg pain.  There is some facet arthritis as well that could cause back pain.  You have a back injection scheduled for July 9.  Please let me know how this goes.  If not better recommend return to clinic to go over the results of full detail and discuss treatment plan and options.

## 2023-08-30 ENCOUNTER — Other Ambulatory Visit

## 2023-08-31 DIAGNOSIS — Z6841 Body Mass Index (BMI) 40.0 and over, adult: Secondary | ICD-10-CM | POA: Diagnosis not present

## 2023-08-31 DIAGNOSIS — I152 Hypertension secondary to endocrine disorders: Secondary | ICD-10-CM | POA: Diagnosis not present

## 2023-08-31 DIAGNOSIS — M4800 Spinal stenosis, site unspecified: Secondary | ICD-10-CM | POA: Diagnosis not present

## 2023-08-31 DIAGNOSIS — G4733 Obstructive sleep apnea (adult) (pediatric): Secondary | ICD-10-CM | POA: Diagnosis not present

## 2023-08-31 DIAGNOSIS — M170 Bilateral primary osteoarthritis of knee: Secondary | ICD-10-CM | POA: Diagnosis not present

## 2023-08-31 DIAGNOSIS — I5042 Chronic combined systolic (congestive) and diastolic (congestive) heart failure: Secondary | ICD-10-CM | POA: Diagnosis not present

## 2023-08-31 DIAGNOSIS — L89151 Pressure ulcer of sacral region, stage 1: Secondary | ICD-10-CM | POA: Diagnosis not present

## 2023-08-31 DIAGNOSIS — D539 Nutritional anemia, unspecified: Secondary | ICD-10-CM | POA: Diagnosis not present

## 2023-08-31 DIAGNOSIS — F988 Other specified behavioral and emotional disorders with onset usually occurring in childhood and adolescence: Secondary | ICD-10-CM | POA: Diagnosis not present

## 2023-08-31 DIAGNOSIS — M47816 Spondylosis without myelopathy or radiculopathy, lumbar region: Secondary | ICD-10-CM | POA: Diagnosis not present

## 2023-08-31 DIAGNOSIS — E1169 Type 2 diabetes mellitus with other specified complication: Secondary | ICD-10-CM | POA: Diagnosis not present

## 2023-08-31 DIAGNOSIS — I4901 Ventricular fibrillation: Secondary | ICD-10-CM | POA: Diagnosis not present

## 2023-08-31 DIAGNOSIS — G5601 Carpal tunnel syndrome, right upper limb: Secondary | ICD-10-CM | POA: Diagnosis not present

## 2023-08-31 DIAGNOSIS — G43909 Migraine, unspecified, not intractable, without status migrainosus: Secondary | ICD-10-CM | POA: Diagnosis not present

## 2023-08-31 DIAGNOSIS — I428 Other cardiomyopathies: Secondary | ICD-10-CM | POA: Diagnosis not present

## 2023-08-31 DIAGNOSIS — I080 Rheumatic disorders of both mitral and aortic valves: Secondary | ICD-10-CM | POA: Diagnosis not present

## 2023-08-31 DIAGNOSIS — I252 Old myocardial infarction: Secondary | ICD-10-CM | POA: Diagnosis not present

## 2023-08-31 DIAGNOSIS — E669 Obesity, unspecified: Secondary | ICD-10-CM | POA: Diagnosis not present

## 2023-08-31 DIAGNOSIS — T8149XA Infection following a procedure, other surgical site, initial encounter: Secondary | ICD-10-CM | POA: Diagnosis not present

## 2023-08-31 DIAGNOSIS — E785 Hyperlipidemia, unspecified: Secondary | ICD-10-CM | POA: Diagnosis not present

## 2023-08-31 DIAGNOSIS — G8929 Other chronic pain: Secondary | ICD-10-CM | POA: Diagnosis not present

## 2023-08-31 DIAGNOSIS — Z792 Long term (current) use of antibiotics: Secondary | ICD-10-CM | POA: Diagnosis not present

## 2023-08-31 DIAGNOSIS — E1159 Type 2 diabetes mellitus with other circulatory complications: Secondary | ICD-10-CM | POA: Diagnosis not present

## 2023-08-31 DIAGNOSIS — B9689 Other specified bacterial agents as the cause of diseases classified elsewhere: Secondary | ICD-10-CM | POA: Diagnosis not present

## 2023-08-31 DIAGNOSIS — F333 Major depressive disorder, recurrent, severe with psychotic symptoms: Secondary | ICD-10-CM | POA: Diagnosis not present

## 2023-09-01 ENCOUNTER — Telehealth: Payer: Self-pay | Admitting: *Deleted

## 2023-09-01 NOTE — Telephone Encounter (Signed)
 Received on (07/23/2023) via of fax Physician Order from [facility].  Requesting signature,date,and return.  Given to provider to complete.    Physician Order signed,dated,and faxed back to [facility].  Confirmation received and copy scanned into the chart.//AB/CMA

## 2023-09-02 ENCOUNTER — Other Ambulatory Visit

## 2023-09-02 DIAGNOSIS — T8149XA Infection following a procedure, other surgical site, initial encounter: Secondary | ICD-10-CM | POA: Diagnosis not present

## 2023-09-02 DIAGNOSIS — I152 Hypertension secondary to endocrine disorders: Secondary | ICD-10-CM | POA: Diagnosis not present

## 2023-09-02 DIAGNOSIS — B9689 Other specified bacterial agents as the cause of diseases classified elsewhere: Secondary | ICD-10-CM | POA: Diagnosis not present

## 2023-09-02 DIAGNOSIS — L89151 Pressure ulcer of sacral region, stage 1: Secondary | ICD-10-CM | POA: Diagnosis not present

## 2023-09-02 DIAGNOSIS — F333 Major depressive disorder, recurrent, severe with psychotic symptoms: Secondary | ICD-10-CM | POA: Diagnosis not present

## 2023-09-02 DIAGNOSIS — E1159 Type 2 diabetes mellitus with other circulatory complications: Secondary | ICD-10-CM | POA: Diagnosis not present

## 2023-09-02 NOTE — Discharge Instructions (Signed)

## 2023-09-02 NOTE — Addendum Note (Signed)
 Addended by: JOANE ARTIST RAMAN on: 09/02/2023 10:03 AM   Modules accepted: Orders

## 2023-09-03 ENCOUNTER — Inpatient Hospital Stay
Admission: RE | Admit: 2023-09-03 | Discharge: 2023-09-03 | Disposition: A | Discharge status: Home / Self Care | Source: Ambulatory Visit | Attending: Family Medicine | Admitting: Family Medicine

## 2023-09-03 ENCOUNTER — Ambulatory Visit
Admission: RE | Admit: 2023-09-03 | Discharge: 2023-09-03 | Disposition: A | Discharge status: Home / Self Care | Source: Ambulatory Visit | Attending: Family Medicine | Admitting: Family Medicine

## 2023-09-03 ENCOUNTER — Ambulatory Visit: Admitting: Student

## 2023-09-03 ENCOUNTER — Encounter: Payer: Self-pay | Admitting: Student

## 2023-09-03 VITALS — BP 117/82 | HR 98

## 2023-09-03 DIAGNOSIS — S81802D Unspecified open wound, left lower leg, subsequent encounter: Secondary | ICD-10-CM

## 2023-09-03 DIAGNOSIS — M545 Low back pain, unspecified: Secondary | ICD-10-CM

## 2023-09-03 DIAGNOSIS — M47816 Spondylosis without myelopathy or radiculopathy, lumbar region: Secondary | ICD-10-CM | POA: Diagnosis not present

## 2023-09-03 MED ORDER — IOPAMIDOL (ISOVUE-M 200) INJECTION 41%
1.0000 mL | Freq: Once | INTRAMUSCULAR | Status: AC
  Administered 2023-09-03: 1 mL via INTRA_ARTICULAR

## 2023-09-03 MED ORDER — METHYLPREDNISOLONE ACETATE 40 MG/ML INJ SUSP (RADIOLOG
80.0000 mg | Freq: Once | INTRAMUSCULAR | Status: AC
  Administered 2023-09-03: 80 mg via INTRA_ARTICULAR

## 2023-09-03 NOTE — Progress Notes (Addendum)
   Referring Provider Joshua Debby CROME, MD [address] [place],  [place] [address]   CC:  Chief Complaint  Patient presents with   Follow-up      Danylle Ouk is an 58 y.o. female.  HPI: Patient is a 58 year old female with a history of a large wound to her left lower extremity. She has undergone several debridements and is here for further follow-up of her wound.   Patient was last seen in the clinic on 08/12/2023.  At this visit, patient was doing well.  On exam, the wound was approximately 10 cm x 10 cm at its longest and widest.  There is recommended that they continue with dressing changes as prescribed.  Today, patient presents with her husband at bedside.  She reports she is overall doing well.  She states that she saw wound care and they recommended Polymer wound dressing daily.  She states that she is still having some pain and tightness.  She states that she has been trying to stretch her leg.  She does report physical therapy and home health has been coming out.   Review of Systems General: Does not report any infectious symptoms  Physical Exam    09/03/2023    1:21 PM 09/03/2023   11:34 AM 08/26/2023    1:22 PM  Vitals with BMI  Systolic 117 149 --  Diastolic 82 98 --  Pulse 98 81     General:  No acute distress,  Alert and oriented, Non-Toxic, Normal speech and affect On exam, patient is sitting upright in no acute distress.  Wound to the left lower extremity is approximately 8 cm x 10 cm.  There still is a little bit of exudate over the wound centrally, but granulation tissue was noted throughout the majority of the wound.  There is improved epithelialization at the edges.  There are no signs of infection on exam.  Assessment/Plan  Wound of left lower extremity, subsequent encounter   I discussed with the patient that she may continue with wound care as recommended by the wound care team.  I discussed with her that she does not have to follow-up with us  anymore and  can continue to follow-up with wound care if she would like.  I did discuss with her though that I am happy to see her back if she would prefer that.  I discussed with the patient that if she would like to return to our clinic, she should return in about a month or so for reevaluation.  I instructed her and her husband to call if they have any questions or concerns about anything.  Pictures were obtained of the patient and placed in the chart with the patient's or guardian's permission.   [name] 09/03/2023, 3:57 PM   As noted by Ms Peck. Ms Sadowski has had remarkable improvement on her wound. At this point she really should limit her care to one team. I have no problem if she would like to transfer to the wound care center.

## 2023-09-03 NOTE — Discharge Instructions (Signed)

## 2023-09-04 ENCOUNTER — Encounter (HOSPITAL_BASED_OUTPATIENT_CLINIC_OR_DEPARTMENT_OTHER): Attending: Internal Medicine | Admitting: Internal Medicine

## 2023-09-04 DIAGNOSIS — I469 Cardiac arrest, cause unspecified: Secondary | ICD-10-CM | POA: Insufficient documentation

## 2023-09-04 DIAGNOSIS — T798XXA Other early complications of trauma, initial encounter: Secondary | ICD-10-CM | POA: Diagnosis not present

## 2023-09-04 DIAGNOSIS — L97828 Non-pressure chronic ulcer of other part of left lower leg with other specified severity: Secondary | ICD-10-CM | POA: Diagnosis not present

## 2023-09-04 DIAGNOSIS — T8140XA Infection following a procedure, unspecified, initial encounter: Secondary | ICD-10-CM | POA: Insufficient documentation

## 2023-09-04 NOTE — Progress Notes (Signed)
  Electrophysiology Office Note:   Date:  09/05/2023  ID:  [name], DOB [birth year], MRN [id number]  Primary Cardiologist: Lynwood Schilling, MD Primary Heart Failure: None Electrophysiologist: Gustin Zobrist [name], MD      History of Present Illness:   [name] is a 58 y.o. 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   Since last being seen in our clinic the patient reports doing overall well.  She is continue to heal from her left lower extremity wound.  She has had no further arrhythmias.  She is ready for ICD implantation.  she denies chest pain, palpitations, dyspnea, PND, orthopnea, nausea, vomiting, dizziness, syncope, edema, weight gain, or early satiety.   Review of systems complete and found to be negative unless listed in HPI.   EP Information / Studies Reviewed:    EKG is not ordered today. EKG from 07/03/2023 reviewed which showed sinus rhythm, PVCs        Risk Assessment/Calculations:              Physical Exam:   VS:  BP 128/68 (BP Location: Left Arm, Patient Position: Sitting, Cuff Size: Large)   Pulse 74   Ht 5' 4 (1.626 m)   Wt 212 lb (96.2 kg)   LMP  (LMP Unknown)   SpO2 95%   BMI 36.39 kg/m    Wt Readings from Last 3 Encounters:  09/05/23 212 lb (96.2 kg)  08/19/23 229 lb (103.9 kg)  08/18/23 229 lb (103.9 kg)     GEN: Well nourished, well developed in no acute distress NECK: No JVD; No carotid bruits CARDIAC: Regular rate and rhythm, no murmurs, rubs, gallops RESPIRATORY:  Clear to auscultation without rales, wheezing or rhonchi  ABDOMEN: Soft, non-tender, non-distended EXTREMITIES:  No edema; No deformity   ASSESSMENT AND PLAN:    1.  Out-of-hospital cardiac arrest: Due to ventricular fibrillation.  Was discharged with a LifeVest, which she is no longer wearing.  Had a wound infection due to an  infiltrated osseous IV.  Per infectious disease and wound care, can proceed with ICD implant.  Risk and benefits have been discussed.  She understands the risk and is agreed to the procedure.  [name] give her a dose of IV ciprofloxacin  at the time of the procedure.  Explained risks, benefits, and alternatives to ICD implantation, including but not limited to bleeding, infection, pneumothorax, pericardial effusion, lead dislodgement, heart attack, stroke, or death.  Pt verbalized understanding and agrees to proceed.  2.  Chronic systolic heart failure: On [employer] medical therapy per primary cardiology  3.  Obstructive sleep apnea: CPAP compliance encouraged  Follow up with Dr. Norton as usual post procedure  Signed, Kynsley Whitehouse [name], MD

## 2023-09-05 ENCOUNTER — Ambulatory Visit: Attending: Cardiology | Admitting: Cardiology

## 2023-09-05 ENCOUNTER — Encounter: Payer: Self-pay | Admitting: Cardiology

## 2023-09-05 VITALS — BP 128/68 | HR 74 | Ht 64.0 in | Wt 212.0 lb

## 2023-09-05 DIAGNOSIS — I5022 Chronic systolic (congestive) heart failure: Secondary | ICD-10-CM | POA: Insufficient documentation

## 2023-09-05 DIAGNOSIS — I469 Cardiac arrest, cause unspecified: Secondary | ICD-10-CM | POA: Insufficient documentation

## 2023-09-05 DIAGNOSIS — G4733 Obstructive sleep apnea (adult) (pediatric): Secondary | ICD-10-CM | POA: Insufficient documentation

## 2023-09-10 ENCOUNTER — Ambulatory Visit (HOSPITAL_COMMUNITY): Admitting: Mental Health

## 2023-09-10 DIAGNOSIS — E1159 Type 2 diabetes mellitus with other circulatory complications: Secondary | ICD-10-CM | POA: Diagnosis not present

## 2023-09-10 DIAGNOSIS — F332 Major depressive disorder, recurrent severe without psychotic features: Secondary | ICD-10-CM

## 2023-09-10 DIAGNOSIS — L89151 Pressure ulcer of sacral region, stage 1: Secondary | ICD-10-CM | POA: Diagnosis not present

## 2023-09-10 DIAGNOSIS — B9689 Other specified bacterial agents as the cause of diseases classified elsewhere: Secondary | ICD-10-CM | POA: Diagnosis not present

## 2023-09-10 DIAGNOSIS — T8149XA Infection following a procedure, other surgical site, initial encounter: Secondary | ICD-10-CM | POA: Diagnosis not present

## 2023-09-10 DIAGNOSIS — F419 Anxiety disorder, unspecified: Secondary | ICD-10-CM

## 2023-09-10 DIAGNOSIS — F333 Major depressive disorder, recurrent, severe with psychotic symptoms: Secondary | ICD-10-CM | POA: Diagnosis not present

## 2023-09-10 DIAGNOSIS — I152 Hypertension secondary to endocrine disorders: Secondary | ICD-10-CM | POA: Diagnosis not present

## 2023-09-10 NOTE — Progress Notes (Signed)
   THERAPIST PROGRESS NOTE  Session Time: 2:07 pm ( 54 minutes)   Participation Level: Active  Behavioral Response: Neat and Well GroomedAlertEuthymic  Type of Therapy: Individual Therapy  Treatment Goals addressed:  STG: To cope. [name] will increase management of moods AEB development of x3 effective coping skills with ability to process thoughts and feelings in balanced manner within the next 90 days.   ProgressTowards Goals: Progressing  Interventions: Supportive  Summary: [name] is a 58 y.o. female who presents with dx of major depression recurrent severe.  Presents for session alert and oriented; mood and affect adequate; slightly irritable. In noticeable pain; slow gait with use of cane. Presents with husband to session per her own request to discuss concerns. Shares some improvement with her leg but notes frustration in regard to interaction with pain management. Shares constant feeling of pain and thus high degree of irritability as a result. Shares hx of use of THC and feels as if this may help the severity of pain. Notes concern as husband who is of Muslim religion, as is she does not approve. Husband shares thoughts on her use of THC for pain, noting he sees degree of pain she is in and would allow and support if it was for pain. Shares concern for her use recreationally as he believes use has been normalized for her. [name] shares feelings in regard to hx of infidelity of husband in [place] and his upcoming visit; husband expresses his thoughts. [name] agrees to explore additional means of support and coping outside of [facility] use however would still like to try. Denies safety concerns. Ongoing work towards goals.   Suicidal/Homicidal: Nowithout intent/plan  Therapist Response:  Therapist engaged [name] in therapy session. Assessed for current level of functioning, sxs management and current stressors. Provided safe space to share thoughts and feelings in regards to current  stressors. Active empathic listening; Provided support and encouragement; validated feelings. Supported Insurance risk surveyor in husband in processing concerns for her desire for [facility] use. Provided support in communicating towards each other and facilitated understanding between the two. Supported in working through concerns for hx of infidelity and explored working towards compromise and progress. Shares with [name] therapist lack of condoning use of THC but is aware of it's medicinal use in other states. Reviewed session and provided follow up Plan: Return again in  x 2 weeks.  Diagnosis: Major depressive disorder, recurrent episode, severe with anxious distress (HCC)  Anxiety  Collaboration of Care: Other None  Patient/Guardian was advised Release of Information must be obtained prior to any record release in order to collaborate their care with an outside provider. Patient/Guardian was advised if they have not already done so to contact the registration department to sign all necessary forms in order for us  to release information regarding their care.   Consent: Patient/Guardian gives verbal consent for treatment and assignment of benefits for services provided during this visit. Patient/Guardian expressed understanding and agreed to proceed.   Ty Asal [place], [facility] 09/10/2023

## 2023-09-11 DIAGNOSIS — T8149XA Infection following a procedure, other surgical site, initial encounter: Secondary | ICD-10-CM | POA: Diagnosis not present

## 2023-09-11 DIAGNOSIS — E1159 Type 2 diabetes mellitus with other circulatory complications: Secondary | ICD-10-CM | POA: Diagnosis not present

## 2023-09-11 DIAGNOSIS — B9689 Other specified bacterial agents as the cause of diseases classified elsewhere: Secondary | ICD-10-CM | POA: Diagnosis not present

## 2023-09-11 DIAGNOSIS — I152 Hypertension secondary to endocrine disorders: Secondary | ICD-10-CM | POA: Diagnosis not present

## 2023-09-11 DIAGNOSIS — L89151 Pressure ulcer of sacral region, stage 1: Secondary | ICD-10-CM | POA: Diagnosis not present

## 2023-09-11 DIAGNOSIS — F333 Major depressive disorder, recurrent, severe with psychotic symptoms: Secondary | ICD-10-CM | POA: Diagnosis not present

## 2023-09-15 ENCOUNTER — Other Ambulatory Visit (HOSPITAL_COMMUNITY): Payer: Self-pay | Admitting: Internal Medicine

## 2023-09-15 DIAGNOSIS — T798XXA Other early complications of trauma, initial encounter: Secondary | ICD-10-CM

## 2023-09-16 ENCOUNTER — Telehealth: Payer: Self-pay

## 2023-09-16 DIAGNOSIS — E1159 Type 2 diabetes mellitus with other circulatory complications: Secondary | ICD-10-CM | POA: Diagnosis not present

## 2023-09-16 DIAGNOSIS — I152 Hypertension secondary to endocrine disorders: Secondary | ICD-10-CM | POA: Diagnosis not present

## 2023-09-16 DIAGNOSIS — L89151 Pressure ulcer of sacral region, stage 1: Secondary | ICD-10-CM | POA: Diagnosis not present

## 2023-09-16 DIAGNOSIS — B9689 Other specified bacterial agents as the cause of diseases classified elsewhere: Secondary | ICD-10-CM | POA: Diagnosis not present

## 2023-09-16 DIAGNOSIS — T8149XA Infection following a procedure, other surgical site, initial encounter: Secondary | ICD-10-CM | POA: Diagnosis not present

## 2023-09-16 DIAGNOSIS — F333 Major depressive disorder, recurrent, severe with psychotic symptoms: Secondary | ICD-10-CM | POA: Diagnosis not present

## 2023-09-16 NOTE — Telephone Encounter (Signed)
 Received voicemail from patient wanting to know how long she should be on the amoxicillin , states she only has 4 capsules left and her ICD placement is scheduled for 9/10.  if she wants to stop I wouldn't fault her but if she wants to continue and tolerate it can stop the day after placement  Will notify patient via MyChart.   Analys Ryden, BSN, RN

## 2023-09-17 ENCOUNTER — Encounter: Payer: Self-pay | Admitting: Physician Assistant

## 2023-09-17 ENCOUNTER — Ambulatory Visit: Attending: Cardiovascular Disease | Admitting: Physician Assistant

## 2023-09-17 VITALS — BP 119/81 | HR 93 | Ht 64.0 in | Wt 225.2 lb

## 2023-09-17 DIAGNOSIS — E785 Hyperlipidemia, unspecified: Secondary | ICD-10-CM | POA: Insufficient documentation

## 2023-09-17 DIAGNOSIS — I469 Cardiac arrest, cause unspecified: Secondary | ICD-10-CM | POA: Insufficient documentation

## 2023-09-17 DIAGNOSIS — I428 Other cardiomyopathies: Secondary | ICD-10-CM | POA: Insufficient documentation

## 2023-09-17 DIAGNOSIS — I10 Essential (primary) hypertension: Secondary | ICD-10-CM | POA: Diagnosis not present

## 2023-09-17 DIAGNOSIS — E119 Type 2 diabetes mellitus without complications: Secondary | ICD-10-CM | POA: Insufficient documentation

## 2023-09-17 DIAGNOSIS — I4901 Ventricular fibrillation: Secondary | ICD-10-CM | POA: Insufficient documentation

## 2023-09-17 NOTE — Patient Instructions (Signed)
 Medication Instructions:  NO CHANGES *If you need a refill on your cardiac medications before your next appointment, please call your pharmacy*  Lab Work: NO LABS If you have labs (blood work) drawn today and your tests are completely normal, you will receive your results only by: MyChart Message (if you have MyChart) OR A paper copy in the mail If you have any lab test that is abnormal or we need to change your treatment, we will call you to review the results.  Testing/Procedures:[address] - IN DECEMBER 2025 Your physician has requested that you have an echocardiogram. Echocardiography is a painless test that uses sound waves to create images of your heart. It provides your doctor with information about the size and shape of your heart and how well your heart's chambers and valves are working. This procedure takes approximately one hour. There are no restrictions for this procedure. Please do NOT wear cologne, perfume, aftershave, or lotions (deodorant is allowed). Please arrive 15 minutes prior to your appointment time.  Please note: We ask at that you not bring children with you during ultrasound (echo/ vascular) testing. Due to room size and safety concerns, children are not allowed in the ultrasound rooms during exams. Our front office staff cannot provide observation of children in our lobby area while testing is being conducted. An adult accompanying a patient to their appointment will only be allowed in the ultrasound room at the discretion of the ultrasound technician under special circumstances. We apologize for any inconvenience.   Follow-Up: At [facility], you and your health needs are our priority.  As part of our continuing mission to provide you with exceptional heart care, our providers are all part of one team.  This team includes your primary Cardiologist (physician) and Advanced Practice Providers or APPs (Physician Assistants and Nurse Practitioners) who all  work together to provide you with the care you need, when you need it.  Your next appointment:   5-6 month(s) AFTER ECHOCARDIOGRAM  Provider:   Lynwood Schilling, MD

## 2023-09-17 NOTE — Progress Notes (Unsigned)
 Cardiology Office Note   Date:  09/19/2023  ID:  Tashunda Vandezande, DOB 17-Oct-1965, MRN [id number] PCP: Joshua Debby CROME, MD  [facility] Providers Cardiologist:  Lynwood Schilling, MD Electrophysiologist:  Soyla [name], MD     History of Present Illness Meyer Dockery is a 58 y.o. female with a hx of ADD, depression, HTN, HLD, DM II, morbid obesity, obstructive sleep apnea on CPAP therapy and a history of nonischemic cardiomyopathy.  Cardiac catheterization in February 2018 revealed normal coronary arteries, EF 25 to 30%, mild pulmonary hypertension, elevated LVEDP.  She had acute on chronic systolic and diastolic heart failure in August 2018.  Repeat echocardiogram in November 2019 showed EF 30 to 35%. Echocardiogram in November 2021 showed EF has improved to 50 to 55%.  Patient was seen by Dr. Schilling on 06/19/2020, it seems she has been dealing with depression and stopped all her medication for over 2 months, the medication has been restarted at lower dose.  Venous Doppler in 2019 was negative for DVT.  Unfortunately patient was admitted in October 2024 with witnessed cardiac arrest.  She was in V-fib on arrival and was shocked 6 times and underwent CPR/ACLS for 40 minutes with ROSC.  She was started on amiodarone  drip.  Cardiac catheterization showed normal coronary arteries with preserved cardiac output.  Echocardiogram obtained on 12/07/2022 showed EF 30 to 35%. Cardiac MRI showed EF 31% with normal RV.  EP was consulted.  Hospital course complicated by complex lower extremity wound after IO infiltration.  She developed a full-thickness wound.  Plastic surgery was consulted.  ICD was not placed due to lower extremity wound.  Patient was last seen by Dr. Schilling in April 2025, Cozaar  was increased to 25 mg daily. Since then, she has been seen by EP service who think the patient continued to need ICD.  EP is reaching out to her plastic surgeon and the infectious disease physician to discuss  possible timing of ICD implantation.  I last saw the patient on 07/29/2023, blood pressure was low at the time, I reduced her losartan  to 12.5 mg daily.  Plastic surgery had cleared the patient to come off antibiotic at the time.  She has been followed by Dr. Overton of ID service.  Dr. Norton discussed the case with Dr. Overton and ultimately decided to proceed with ICD implantation in September.   Patient presents today for follow-up.  Blood pressure has been stable after losartan  was cut back.  She is feeling well.  She is very worried about her upcoming procedure which is very understandable as patient has suffered a lot of trauma after the previous V-fib arrest.  I reassured her that the complication associated with ICD implantation is generally very rare.  She  does mention that she has received a call from Dr. Chapman office told her that she has a choice of either continuing on the amoxicillin  or discontinue it.  She is wondering if she should continue the antibiotics through the ICD implantation.  She has the prescription for the antibiotic and had just recently picked up a course, she has no problem continue on the antibiotic for the time being.  She is under the impression that she has been told to continue on antibiotic for at least 2 weeks after the ICD implantation.  She has no lower extremity edema, orthopnea or PND.  She does have occasional PVCs.  She can follow-up with Dr. Schilling in 5 to 6 months.  She will need a repeat  echocardiogram prior to her next follow-up with Dr. Lavona.   ROS:   She denies chest pain, palpitations, dyspnea, pnd, orthopnea, n, v, dizziness, syncope, edema, weight gain, or early satiety. All other systems reviewed and are otherwise negative except as noted above.    Studies Reviewed      Cardiac Studies & Procedures   ______________________________________________________________________________________________ CARDIAC CATHETERIZATION  CARDIAC CATHETERIZATION  12/09/2022  Conclusion Images from the original result were not included. Right & Left Heart Catheterization 12/09/22: Hemodynamic data: LV: 119/7, EDP 17 mmHg.  Ao 110/78, mean 92 mmHg.  No pressure gradient across the aortic valve. RA: 15/13, mean 8 mmHg. RV 47/6, EDP 17 mmHg. PA 46/21, mean 31 mmHg.  PA saturation 66%. PW 18/19, mean 19 mmHg.  AO saturation 92%. CO 6.69, CI 2.83, preserved and normal.  QP/QS 1.00.  Angiographic data: RCA: Dominant, has anterior origin, small to normal. LM: Has superior takeoff, a 5 [place] 3.0 guide catheter was utilized to engage.  Normal left main. LAD: Large-caliber vessel giving origin to 2 large diagonals, smooth and normal. RI: Moderate caliber vessel, smooth and normal. LCx: Large vessel giving origin to large OM1 which has secondary branches and continues in the AV groove is a moderate-sized vessel and gives origin to OM 2 from the distal end.  Smooth and normal.    Impression and recommendations: Findings consistent with nonischemic cardiomyopathy.  Mildly elevated PA pressure with elevated EDP.  Findings Coronary Findings Diagnostic  Dominance: Right  Left Main Vessel was injected. Vessel is normal in caliber. Vessel is angiographically normal.  Left Anterior Descending Vessel was injected. Vessel is large. Vessel is angiographically normal.  First Diagonal Branch Vessel was injected. Vessel is small in size. Vessel is angiographically normal.  First Septal Branch Vessel was injected. Vessel is small in size. Vessel is angiographically normal.  Second Diagonal Branch Vessel was injected. Vessel is small in size. Vessel is angiographically normal.  Second Septal Branch Vessel was injected. Vessel is small in size. Vessel is angiographically normal.  Third Diagonal Branch Vessel was injected. Vessel is small in size. Vessel is angiographically normal.  Ramus Intermedius Vessel was injected. Vessel is moderate in size.  Vessel is angiographically normal.  Left Circumflex Vessel was injected. Vessel is large. Vessel is angiographically normal.  First Obtuse Marginal Branch Vessel was injected. Vessel is small in size. Vessel is angiographically normal.  Third Obtuse Marginal Branch Vessel was injected. Vessel is moderate in size. Vessel is angiographically normal.  Right Coronary Artery Vessel was injected. Vessel is small. Vessel is angiographically normal.  Right Posterior Descending Artery Vessel was injected. Vessel is moderate in size. Vessel is angiographically normal.  Intervention  No interventions have been documented.     ECHOCARDIOGRAM  ECHOCARDIOGRAM COMPLETE 12/07/2022  Narrative ECHOCARDIOGRAM REPORT    Patient Name:   JAVAEH MUSCATELLO Date of Exam: 12/07/2022 Medical Rec #:  [id number]    Height:       64.0 in Accession #:    [id number]   Weight:       302.9 lb Date of Birth:  [birth year]     BSA:          2.334 m Patient Age:    57 years     BP:           133/67 mmHg Patient Gender: F            HR:           78 bpm. Exam Location:  Inpatient  Procedure: 2D Echo, Color Doppler, Cardiac Doppler and Intracardiac Opacification Agent  Indications:    Shock  History:        Patient has no prior history of Echocardiogram examinations and Patient has prior history of Echocardiogram examinations, most recent 07/10/2021. Risk Factors:Diabetes, Dyslipidemia, Obstructive Sleep Apnea and Former Smoker.  Sonographer:    Logan Shove Referring Phys: [phone] DEWARD ORN [facility]   Sonographer Comments: Suboptimal parasternal window, patient is obese and suboptimal subcostal window. IMPRESSIONS   1. Left ventricular ejection fraction, by estimation, is 30 to 35%. The left ventricle has moderately decreased function. The left ventricle demonstrates global hypokinesis. There is mild left ventricular hypertrophy. Indeterminate diastolic filling due to E-A fusion. 2. Right ventricular systolic  function is normal. The right ventricular size is normal. 3. The mitral valve is normal in structure. Trivial mitral valve regurgitation. 4. The aortic valve is tricuspid. Aortic valve regurgitation is not visualized. 5. The inferior vena cava IVC not well visualized.  Comparison(s): No significant change from prior study.  FINDINGS Left Ventricle: Left ventricular ejection fraction, by estimation, is 30 to 35%. The left ventricle has moderately decreased function. The left ventricle demonstrates global hypokinesis. Definity  contrast agent was given IV to delineate the left ventricular endocardial borders. The left ventricular internal cavity size was normal in size. There is mild left ventricular hypertrophy. Indeterminate diastolic filling due to E-A fusion.  Right Ventricle: The right ventricular size is normal. Right ventricular systolic function is normal.  Left Atrium: Left atrial size was normal in size.  Right Atrium: Right atrial size was normal in size.  Pericardium: There is no evidence of pericardial effusion.  Mitral Valve: The mitral valve is normal in structure. Trivial mitral valve regurgitation.  Tricuspid Valve: Tricuspid valve regurgitation is not demonstrated.  Aortic Valve: The aortic valve is tricuspid. Aortic valve regurgitation is not visualized. Aortic valve mean gradient measures 4.0 mmHg. Aortic valve peak gradient measures 7.0 mmHg.  Pulmonic Valve: Pulmonic valve regurgitation is not visualized.  Aorta: The aortic root and ascending aorta are structurally normal, with no evidence of dilitation.  Venous: The inferior vena cava IVC not well visualized.  IAS/Shunts: The interatrial septum was not well visualized.   LEFT VENTRICLE PLAX 2D LVIDd:         4.70 cm      Diastology LVIDs:         3.90 cm      LV e' medial:    2.94 cm/s LV PW:         1.10 cm      LV E/e' medial:  14.1 LV IVS:        1.00 cm      LV e' lateral:   2.93 cm/s LVOT diam:      2.00 cm      LV E/e' lateral: 14.2 LVOT Area:     3.14 cm  LV Volumes (MOD) LV vol d, MOD A2C: 184.0 ml LV vol d, MOD A4C: 187.0 ml LV vol s, MOD A2C: 128.0 ml LV vol s, MOD A4C: 130.0 ml LV SV MOD A2C:     56.0 ml LV SV MOD A4C:     187.0 ml LV SV MOD BP:      58.1 ml  RIGHT VENTRICLE RV Basal diam:  3.80 cm RV Mid diam:    3.30 cm RV S prime:     9.60 cm/s TAPSE (M-mode): 1.5 cm  LEFT ATRIUM  Index        RIGHT ATRIUM           Index LA diam:        4.00 cm 1.71 cm/m   RA Area:     14.40 cm LA Vol (A2C):   19.2 ml 8.23 ml/m   RA Volume:   34.40 ml  14.74 ml/m LA Vol (A4C):   31.0 ml 13.28 ml/m LA Biplane Vol: 26.1 ml 11.18 ml/m AORTIC VALVE AV Area (Vmax): 1.53 cm AV Vmax:        132.00 cm/s AV Vmean:       87.400 cm/s AV VTI:         0.230 m AV Peak Grad:   7.0 mmHg AV Mean Grad:   4.0 mmHg LVOT Vmax:      64.30 cm/s  AORTA Ao Root diam: 2.70 cm Ao Asc diam:  3.00 cm  MITRAL VALVE MV Area (PHT): 3.37 cm    SHUNTS MV Decel Time: 225 msec    Systemic Diam: 2.00 cm MV E velocity: 41.60 cm/s MV A velocity: 59.50 cm/s MV E/A ratio:  0.70  Photographer signed by Ronal Ross Signature Date/Time: 12/07/2022/2:19:27 PM    Final        CARDIAC MRI  MR CARDIAC MORPHOLOGY W WO CONTRAST 12/12/2022  Narrative CLINICAL DATA:  65F with chronic CHF p/w cardiac arrest. Cath with normal coronary arteries. Echo with EF 30-35%, normal RV function  EXAM: CARDIAC MRI  TECHNIQUE: The patient was scanned on a 1.5 Tesla Siemens magnet. A dedicated cardiac coil was used. Functional imaging was done using Fiesta sequences. 2,3, and 4 chamber views were done to assess for RWMA's. Modified Simpson's rule using a short axis stack was used to calculate an ejection fraction on a dedicated work [employer]. The patient received 10 cc of Gadavist . After 10 minutes inversion recovery sequences were used to assess  for infiltration and scar tissue. Phase contrast velocity mapping was performed above the aortic and pulmonic valves  CONTRAST:  10 cc  of Gadavist   FINDINGS: Left ventricle:  -No hypertrophy  -Normal size  -Moderate systolic dysfunction  -Elevated ECV (31%)  -Normal T2 values  -RV insertion site LGE  LV EF:  31% (Normal 52-79%)  Absolute volumes:  LV EDV: (Normal 78-167 mL)  LV ESV: (Normal 21-64 mL)  LV SV: 72mL (Normal 52-114 mL)  CO: 5.9L/min (Normal 2.7-6.3 L/min)  Indexed volumes:  LV EDV: 44mL/sq-m (Normal 50-96 mL/sq-m)  LV ESV: 4mL/sq-m (Normal 10-40 mL/sq-m)  LV SV: 25mL/sq-m (Normal 33-64 mL/sq-m)  CI: 2.4L/min/sq-m (Normal 1.9-3.9 L/min/sq-m)  Right ventricle: Normal size and systolic function  RV EF: 50% (Normal 52-80%)  Absolute volumes:  RV EDV: (Normal 79-175 mL)  RV ESV: 70mL (Normal 13-75 mL)  RV SV: 69mL (Normal 56-110 mL)  CO: 5.6L/min (Normal 2.7-6 L/min)  Indexed volumes:  RV EDV: 2mL/sq-m (Normal 51-97 mL/sq-m)  RV ESV: 57mL/sq-m (Normal 9-42 mL/sq-m)  RV SV: 56mL/sq-m (Normal 35-61 mL/sq-m)  CI: 2.2L/min/sq-m (Normal 1.8-3.8 L/min/sq-m)  Left atrium: Mild enlargement  Right atrium: Normal size  Mitral valve: Trivial regurgitation  Aortic valve: Trivial regurgitation  Tricuspid valve: Trivial regurgitation  Pulmonic valve: Trivial regurgitation  Aorta: Normal proximal ascending aorta  Pericardium: Normal  IMPRESSION: 1. Normal LV size, no hypertrophy, and moderate systolic dysfunction (EF 31%)  2.  Normal RV size and systolic function (EF 50%)  3. RV insertion site late gadolinium enhancement, which is  a nonspecific scar pattern often seen in setting of elevated pulmonary pressures   Electronically Signed By: Lonni Nanas M.D. On: 12/12/2022 22:29   ______________________________________________________________________________________________      Risk  Assessment/Calculations          Physical Exam VS:  BP 119/81 (BP Location: Right Arm, Patient Position: Sitting, Cuff Size: Large)   Pulse 93   Ht 5' 4 (1.626 m)   Wt 225 lb 3.2 oz (102.2 kg)   LMP  (LMP Unknown)   SpO2 95%   BMI 38.66 kg/m        Wt Readings from Last 3 Encounters:  09/17/23 225 lb 3.2 oz (102.2 kg)  09/05/23 212 lb (96.2 kg)  08/19/23 229 lb (103.9 kg)    GEN: Well nourished, well developed in no acute distress NECK: No JVD; No carotid bruits CARDIAC: RRR, no murmurs, rubs, gallops RESPIRATORY:  Clear to auscultation without rales, wheezing or rhonchi  ABDOMEN: Soft, non-tender, non-distended EXTREMITIES:  No edema; No deformity   ASSESSMENT AND PLAN  History of cardiac arrest: ICD implantation has been delayed due to extravasation of IO site and lower extremity wound.  ICD implantation has been scheduled for September.  Per patient, ID office gave her the option of either continue on the amoxicillin  or stop it now.  From the cardiac perspective, I think would be reasonable to continue on the amoxicillin  at least through the ICD implantation.  Will check with Dr. Inocencio.  Nonischemic cardiomyopathy: Continue Farxiga , losartan , metoprolol  succinate and spironolactone .  Plan to repeat echocardiogram prior to the next follow-up with Dr. Lavona.  Hypertension: Blood pressure stable  Hyperlipidemia: On atorvastatin   DM2: Managed by primary care provider.       Dispo: Follow-up with Dr. Lavona in 6 months  Signed, Tamecia Mcdougald, [place]

## 2023-09-18 ENCOUNTER — Encounter (HOSPITAL_BASED_OUTPATIENT_CLINIC_OR_DEPARTMENT_OTHER): Admitting: Internal Medicine

## 2023-09-18 DIAGNOSIS — T8140XA Infection following a procedure, unspecified, initial encounter: Secondary | ICD-10-CM | POA: Diagnosis not present

## 2023-09-18 DIAGNOSIS — T798XXA Other early complications of trauma, initial encounter: Secondary | ICD-10-CM | POA: Diagnosis not present

## 2023-09-18 DIAGNOSIS — L97828 Non-pressure chronic ulcer of other part of left lower leg with other specified severity: Secondary | ICD-10-CM

## 2023-09-18 DIAGNOSIS — I469 Cardiac arrest, cause unspecified: Secondary | ICD-10-CM | POA: Diagnosis not present

## 2023-09-21 ENCOUNTER — Other Ambulatory Visit: Payer: Self-pay | Admitting: Internal Medicine

## 2023-09-21 DIAGNOSIS — F411 Generalized anxiety disorder: Secondary | ICD-10-CM

## 2023-09-22 ENCOUNTER — Telehealth: Payer: Self-pay | Admitting: Cardiology

## 2023-09-22 ENCOUNTER — Other Ambulatory Visit: Payer: Self-pay | Admitting: Internal Medicine

## 2023-09-22 ENCOUNTER — Telehealth: Payer: Self-pay | Admitting: Physician Assistant

## 2023-09-22 DIAGNOSIS — Z1231 Encounter for screening mammogram for malignant neoplasm of breast: Secondary | ICD-10-CM

## 2023-09-22 NOTE — Telephone Encounter (Signed)
 Patient stated she will be having an MRI on her left leg with and without contrast and an upcoming Mammogram and patient wants to know if it is OK for her to have these tests.

## 2023-09-22 NOTE — Telephone Encounter (Signed)
 Pt made aware of MD response/approval to move forward. She appreciates the return call.

## 2023-09-22 NOTE — Telephone Encounter (Signed)
 Discussed with Dr. Inocencio who prefers the patient continue abx through the upcoming ICD implantation. Patient notified.

## 2023-09-22 NOTE — Telephone Encounter (Signed)
 Spoke with pt. Pt wanted to make sure there would be no problems with getting a Mammogram on July 30 or Leg MRI August 17.  Pt is scheduled for ICD-I on Sept 10. Told pt I did not think either would be a problem but I would send it over to Dr Inocencio to review. Pt would like a call back either way.

## 2023-09-24 ENCOUNTER — Ambulatory Visit (HOSPITAL_COMMUNITY): Admitting: Mental Health

## 2023-09-24 ENCOUNTER — Inpatient Hospital Stay
Admission: RE | Admit: 2023-09-24 | Discharge: 2023-09-24 | Discharge status: Home / Self Care | Source: Ambulatory Visit | Attending: Internal Medicine | Admitting: Internal Medicine

## 2023-09-24 DIAGNOSIS — Z1231 Encounter for screening mammogram for malignant neoplasm of breast: Secondary | ICD-10-CM

## 2023-09-25 ENCOUNTER — Ambulatory Visit: Admitting: Student

## 2023-09-25 DIAGNOSIS — E1159 Type 2 diabetes mellitus with other circulatory complications: Secondary | ICD-10-CM | POA: Diagnosis not present

## 2023-09-25 DIAGNOSIS — B9689 Other specified bacterial agents as the cause of diseases classified elsewhere: Secondary | ICD-10-CM | POA: Diagnosis not present

## 2023-09-25 DIAGNOSIS — L89151 Pressure ulcer of sacral region, stage 1: Secondary | ICD-10-CM | POA: Diagnosis not present

## 2023-09-25 DIAGNOSIS — I152 Hypertension secondary to endocrine disorders: Secondary | ICD-10-CM | POA: Diagnosis not present

## 2023-09-25 DIAGNOSIS — F333 Major depressive disorder, recurrent, severe with psychotic symptoms: Secondary | ICD-10-CM | POA: Diagnosis not present

## 2023-09-25 DIAGNOSIS — T8149XA Infection following a procedure, other surgical site, initial encounter: Secondary | ICD-10-CM | POA: Diagnosis not present

## 2023-09-29 ENCOUNTER — Other Ambulatory Visit: Payer: Self-pay | Admitting: Licensed Clinical Social Worker

## 2023-09-29 ENCOUNTER — Other Ambulatory Visit: Payer: Self-pay

## 2023-09-29 NOTE — Patient Outreach (Signed)
 Complex Care Management   Visit Note  09/29/2023  Name:  [name] MRN: [id number] DOB: 01-29-1966  Situation: Referral received for Complex Care Management related to mental health needs and SDOH needs I obtained verbal consent from Patient.  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/2023   ADD (attention deficit disorder)    Anxiety    Arthritis    both knees right worse than left   Carpal tunnel syndrome of right wrist    Chronic back pain    Dysrhythmia    history of Vtach   Enterococcus faecalis infection 04/29/2023   Essential hypertension, benign    Gallstones    History of smoking 06/22/2016   Hyperlipidemia associated with type 2 diabetes mellitus (HCC), on Zocor  04/04/2011   Hypertension associated with diabetes (HCC) 06/03/2008   MDD (major depressive disorder)    Memory impairment    post cardiac arrest   Migraines    Morbid obesity (HCC)    Morbid obesity with BMI of 50.0-59.9, adult (HCC) 07/12/2006   NICM (nonischemic cardiomyopathy) (HCC) 07/12/2006   01/16/18 ECHO:    - Procedure narrative: Transthoracic echocardiography. Image   quality was suboptimal. The study was technically difficult.   Intravenous contrast (Definity ) was administered. - Left ventricle: The cavity size was moderately dilated. Wall   thickness was increased in a pattern of mild LVH. Systolic   function was moderately to severely reduced. 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   Pseudomonas infection 04/29/2023   Spinal stenosis    back pain   Spondylosis without myelopathy or radiculopathy, lumbar region 12/04/2017   Type 2 diabetes mellitus with hyperglycemia (HCC)    Walker as ambulation aid     Assessment: Patient Reported Symptoms:  Cognitive  Cognitive Status: Alert and oriented to person, place, and time Cognitive/Intellectual Conditions Management [RPT]: None reported or documented in medical history or problem list   Health Facilitated by: Prayer/meditation, Stress management  Neurological Neurological Review of Symptoms: Weakness, Dizziness Neurological Management Strategies: Coping strategies  HEENT HEENT Symptoms Reported: No symptoms reported HEENT Management Strategies: Coping strategies, Adequate rest    Cardiovascular Cardiovascular Symptoms Reported: Dizziness, Fatigue Cardiovascular Management Strategies: Coping strategies, Adequate rest  Respiratory Respiratory Symptoms Reported: Shortness of breath Other Respiratory Symptoms: fatigues when walking Additional Respiratory Details: fatigues easily Respiratory Management Strategies: Adequate rest, Coping strategies  Endocrine Endocrine Symptoms Reported: Blurry vision, Weakness or fatigue Is patient diabetic?: Yes    Gastrointestinal Gastrointestinal Symptoms Reported: No symptoms reported Gastrointestinal Management Strategies: Adequate rest, Coping strategies    Genitourinary Genitourinary Symptoms Reported: Frequency Additional Genitourinary Details: frequent urination ; difficult with bowel movement Genitourinary Management Strategies: Adequate rest, Medication therapy  Integumentary Integumentary Symptoms Reported: Wound Additional Integumentary Details: wound area on left leg (slow healing). Daily dressing change Skin Management Strategies: Adequate rest, Coping strategies, Medication therapy  Musculoskeletal Musculoskelatal Symptoms Reviewed: Difficulty walking, Muscle pain, Unsteady gait, Weakness Musculoskeletal Management Strategies: Coping strategies, Adequate rest    Uses a cane or walker as needed to help her walk   Psychosocial Psychosocial Symptoms Reported: Anxiety - if selected complete GAD, Sadness - if selected complete PHQ 2-9, Depression - if  selected complete PHQ 2-9 Additional Psychological Details: feels overwhelmed with  current stressors; her father has health issues and he lives in [place]  Behavioral Management Strategies: Coping strategies, Adequate rest, Counseling Major Change/Loss/Stressor/Fears (CP): Medical condition, self Techniques to Cope with Loss/Stress/Change: Counseling, Medication, Diversional activities Quality of Family Relationships: supportive Do you feel physically threatened by others?: No Sees counselor at [facility] as scheduled       09/29/2023    3:30 PM  Depression screen PHQ 2/9  Decreased Interest 1  Down, Depressed, Hopeless 1  PHQ - 2 Score 2  Altered sleeping 1  Tired, decreased energy 2  Change in appetite 1  Feeling bad or failure about yourself  1  Trouble concentrating 1  Moving slowly or fidgety/restless 1  Suicidal thoughts 0  PHQ-9 Score 9  Difficult doing work/chores Somewhat difficult    Vitals:  BP within normal range, per client information   Medications Reviewed Today   Medications were not reviewed in this encounter     Recommendation:   PCP Follow-up Continue Current Plan of Care Take medications as prescribed Attend mental health appointments as scheduled Call LCSW as needed for SW support  Follow Up Plan:   Telephone follow up appointment date/time:  11/04/23 at 3:00 PM    [name]  MSW, LCSW [place] Licensed Clinical Social Worker Direct Dial:  [phone] Fax:  [phone] Website:  [email or web address]

## 2023-09-29 NOTE — Patient Instructions (Signed)
 Visit Information  Thank you for taking time to visit with me today. Please don't hesitate to contact me if I can be of assistance to you before our next scheduled appointment.  Our next appointment is by telephone on 11/04/23 at 3:00 PM   Please call the care guide team at [phone] if you need to cancel or reschedule your appointment.   Following is a copy of your care plan:   Goals Addressed             This Visit's Progress    VBCI Social Work Care Plan       Problems:   Mobility issues  Uses a walker or a cane as needed to help her walk. Physical therapy sessions for client have ended            Skin care issues             Anxiety issues             Nursing needs (has support of [facility])              Mental health  needs (sees counselor at [facility]             Depression issues             Some financial challenges              Memory issues              ADD                          CSW Clinical Goal(s):      Over the next 30  days the Patient will attend all scheduled Behavioral Health Counseling Appointments as scheduled to help with stress and anxiety issues AEB patient report of attending Behavioral Health appointments scheduled.              Over the next 30 days, client will use coping skills to manage depression issues faced AEB client report of reduction in depression symptoms.  Interventions:  Discussed client pain issues. Takes pain medication as prescribed             Discussed client transport needs. Client receives transport help from her spouse            Discussed skin care needs of client. Discussed nurse support for client in the home.  Nurse from [place] visits client in home one time weekly            Discussed client hobbies:  she said she watches TV a good bit. She said she has decreased energy            Discussed mental health support. She sees counselor at [facility] She has her next counseling  appointment scheduled.             She plans to stay in [place]  until October 10, 2023 visiting with her father; and then, she plans to  return to [place] .              Client has sleep difficulty. She spoke of neuropathy issues.                Spoke of financial challenges. Spoke of higher food costs. She receives Food Stamps benefit (small amount)              Client spoke of diurectic use.  She spoke of frequency of bathroom visits               Discussed client vision.  She said she uses reading glasses               Discussed ambulation of client. She uses a cane or walker as needed. She has pain sometimes when walking               Discussed physical therapy support for client in the home. She said she has now completed HHPT services              Discussed wound care of client. She has wound care for wound site on left leg (slow healing wound). She said she changes dressing daily on wound site               Encouraged client to call LCSW as needed for SW support              Discussed memory issues of client. She is concerned about some memory loss.She said that her spouse helps her with memory issues and staying on schedule.  Her son is helping her currently with client needs.                           Patient Goals/Self-Care Activities:  Client to attend scheduled medical appointments             Client to take medications as prescribed              Client to use coping skills to manage depression issues faced              Client to call LCSW as needed for SW support              Client to participate in scheduled mental health appointments              Plan:   LCSW to call client on 11/04/23 at 3:00 PM         Please go to [facility] [address], [place] [phone]) if you are experiencing a Mental Health or Behavioral Health Crisis or need someone to talk to.  The patient verbalized understanding of instructions, educational  materials, and care plan provided today and DECLINED offer to receive copy of patient instructions, educational materials, and care plan.    [name]  MSW, LCSW [place]/[facility] Licensed Clinical Social Worker Direct Dial:  [phone] Fax:  [phone] Website:  [email or web address]

## 2023-09-30 DIAGNOSIS — B9689 Other specified bacterial agents as the cause of diseases classified elsewhere: Secondary | ICD-10-CM | POA: Diagnosis not present

## 2023-09-30 DIAGNOSIS — T8149XA Infection following a procedure, other surgical site, initial encounter: Secondary | ICD-10-CM | POA: Diagnosis not present

## 2023-10-09 ENCOUNTER — Other Ambulatory Visit: Payer: Self-pay

## 2023-10-09 ENCOUNTER — Telehealth: Payer: Self-pay

## 2023-10-09 DIAGNOSIS — Z01812 Encounter for preprocedural laboratory examination: Secondary | ICD-10-CM

## 2023-10-09 DIAGNOSIS — I5022 Chronic systolic (congestive) heart failure: Secondary | ICD-10-CM

## 2023-10-09 DIAGNOSIS — I469 Cardiac arrest, cause unspecified: Secondary | ICD-10-CM

## 2023-10-09 NOTE — Telephone Encounter (Signed)
 LM on pt's VM informing her that I was sending her Instruction letter for her ICD Implant with Dr. Inocencio to her via MyChart and mailing her a copy. She will need updated labs and to pick up surgical scrub while here. I left my direct number for her to call back.   Pt will need a dose of IV Cipro  during procedure.   ICD Implant on 9/10 at 3:30 pm w/Dr. Inocencio Labs - week of 8/25-29

## 2023-10-12 ENCOUNTER — Ambulatory Visit
Admission: RE | Admit: 2023-10-12 | Discharge: 2023-10-12 | Disposition: A | Discharge status: Home / Self Care | Source: Ambulatory Visit | Attending: Internal Medicine

## 2023-10-12 DIAGNOSIS — T798XXA Other early complications of trauma, initial encounter: Secondary | ICD-10-CM

## 2023-10-12 MED ORDER — GADOPICLENOL 0.5 MMOL/ML IV SOLN
10.0000 mL | Freq: Once | INTRAVENOUS | Status: AC | PRN
  Administered 2023-10-12: 10 mL via INTRAVENOUS

## 2023-10-14 DIAGNOSIS — B9689 Other specified bacterial agents as the cause of diseases classified elsewhere: Secondary | ICD-10-CM | POA: Diagnosis not present

## 2023-10-14 DIAGNOSIS — M1712 Unilateral primary osteoarthritis, left knee: Secondary | ICD-10-CM | POA: Diagnosis not present

## 2023-10-14 DIAGNOSIS — T8149XA Infection following a procedure, other surgical site, initial encounter: Secondary | ICD-10-CM | POA: Diagnosis not present

## 2023-10-16 ENCOUNTER — Telehealth: Payer: Self-pay

## 2023-10-16 ENCOUNTER — Encounter (HOSPITAL_BASED_OUTPATIENT_CLINIC_OR_DEPARTMENT_OTHER): Attending: Internal Medicine | Admitting: Internal Medicine

## 2023-10-16 DIAGNOSIS — T798XXA Other early complications of trauma, initial encounter: Secondary | ICD-10-CM | POA: Diagnosis not present

## 2023-10-16 DIAGNOSIS — L97828 Non-pressure chronic ulcer of other part of left lower leg with other specified severity: Secondary | ICD-10-CM | POA: Diagnosis not present

## 2023-10-16 DIAGNOSIS — T8140XA Infection following a procedure, unspecified, initial encounter: Secondary | ICD-10-CM | POA: Diagnosis not present

## 2023-10-16 DIAGNOSIS — Y838 Other surgical procedures as the cause of abnormal reaction of the patient, or of later complication, without mention of misadventure at the time of the procedure: Secondary | ICD-10-CM | POA: Insufficient documentation

## 2023-10-16 DIAGNOSIS — Z8674 Personal history of sudden cardiac arrest: Secondary | ICD-10-CM | POA: Insufficient documentation

## 2023-10-16 NOTE — Telephone Encounter (Signed)
 [facility] called and patient cancelled her appt for Neuropsych testing. She refused to complete there paperwork. FYI.

## 2023-10-16 NOTE — Telephone Encounter (Signed)
 Noted, thanks!

## 2023-10-23 ENCOUNTER — Other Ambulatory Visit: Payer: Self-pay | Admitting: Internal Medicine

## 2023-10-23 ENCOUNTER — Encounter (HOSPITAL_BASED_OUTPATIENT_CLINIC_OR_DEPARTMENT_OTHER): Admitting: Internal Medicine

## 2023-10-23 ENCOUNTER — Other Ambulatory Visit: Payer: Self-pay | Admitting: Cardiology

## 2023-10-23 DIAGNOSIS — T798XXA Other early complications of trauma, initial encounter: Secondary | ICD-10-CM

## 2023-10-23 DIAGNOSIS — Y838 Other surgical procedures as the cause of abnormal reaction of the patient, or of later complication, without mention of misadventure at the time of the procedure: Secondary | ICD-10-CM | POA: Diagnosis not present

## 2023-10-23 DIAGNOSIS — T8140XA Infection following a procedure, unspecified, initial encounter: Secondary | ICD-10-CM | POA: Diagnosis not present

## 2023-10-23 DIAGNOSIS — L97828 Non-pressure chronic ulcer of other part of left lower leg with other specified severity: Secondary | ICD-10-CM | POA: Diagnosis not present

## 2023-10-23 DIAGNOSIS — E785 Hyperlipidemia, unspecified: Secondary | ICD-10-CM

## 2023-10-23 DIAGNOSIS — Z8674 Personal history of sudden cardiac arrest: Secondary | ICD-10-CM | POA: Diagnosis not present

## 2023-10-24 DIAGNOSIS — T8149XA Infection following a procedure, other surgical site, initial encounter: Secondary | ICD-10-CM | POA: Diagnosis not present

## 2023-10-24 DIAGNOSIS — B9689 Other specified bacterial agents as the cause of diseases classified elsewhere: Secondary | ICD-10-CM | POA: Diagnosis not present

## 2023-10-28 DIAGNOSIS — T8149XA Infection following a procedure, other surgical site, initial encounter: Secondary | ICD-10-CM | POA: Diagnosis not present

## 2023-10-28 DIAGNOSIS — B9689 Other specified bacterial agents as the cause of diseases classified elsewhere: Secondary | ICD-10-CM | POA: Diagnosis not present

## 2023-10-30 DIAGNOSIS — Z01812 Encounter for preprocedural laboratory examination: Secondary | ICD-10-CM | POA: Diagnosis not present

## 2023-10-30 DIAGNOSIS — I469 Cardiac arrest, cause unspecified: Secondary | ICD-10-CM | POA: Diagnosis not present

## 2023-10-30 DIAGNOSIS — T8149XA Infection following a procedure, other surgical site, initial encounter: Secondary | ICD-10-CM | POA: Diagnosis not present

## 2023-10-30 DIAGNOSIS — I5022 Chronic systolic (congestive) heart failure: Secondary | ICD-10-CM | POA: Diagnosis not present

## 2023-10-30 DIAGNOSIS — B9689 Other specified bacterial agents as the cause of diseases classified elsewhere: Secondary | ICD-10-CM | POA: Diagnosis not present

## 2023-10-30 DIAGNOSIS — I4901 Ventricular fibrillation: Secondary | ICD-10-CM | POA: Diagnosis not present

## 2023-10-31 ENCOUNTER — Ambulatory Visit: Admitting: Cardiology

## 2023-10-31 LAB — CBC
Hematocrit: 48.6 % — ABNORMAL HIGH (ref 34.0–46.6)
Hemoglobin: 15.7 g/dL (ref 11.1–15.9)
MCH: 29.3 pg (ref 26.6–33.0)
MCHC: 32.3 g/dL (ref 31.5–35.7)
MCV: 91 fL (ref 79–97)
Platelets: 322 x10E3/uL (ref 150–450)
RBC: 5.35 x10E6/uL — ABNORMAL HIGH (ref 3.77–5.28)
RDW: 15.2 % (ref 11.7–15.4)
WBC: 10.8 x10E3/uL (ref 3.4–10.8)

## 2023-10-31 LAB — BASIC METABOLIC PANEL WITH GFR
BUN/Creatinine Ratio: 24 — AB (ref 9–23)
BUN: 22 mg/dL (ref 6–24)
CO2: 21 mmol/L (ref 20–29)
Calcium: 9.9 mg/dL (ref 8.7–10.2)
Chloride: 102 mmol/L (ref 96–106)
Creatinine, Ser: 0.92 mg/dL (ref 0.57–1.00)
Glucose: 110 mg/dL — AB (ref 70–99)
Potassium: 4.7 mmol/L (ref 3.5–5.2)
Sodium: 144 mmol/L (ref 134–144)
eGFR: 72 mL/min/1.73 (ref >59)

## 2023-11-03 ENCOUNTER — Telehealth: Payer: Self-pay | Admitting: Cardiology

## 2023-11-03 NOTE — Telephone Encounter (Signed)
 Spoke to pt and moved her ICD Implant from 9/10 to 9/29 at 8:30 am at [facility]. Pt will not need updated labs.   I will send updated instruction letter via MyChart.

## 2023-11-03 NOTE — Telephone Encounter (Signed)
 Patient is sick and have a fever unsure if she should have her procedure on Wednesday. Please advise

## 2023-11-03 NOTE — Telephone Encounter (Signed)
 Spoke with the patient who states that she has been running a fever along with a sore throat and congestion since Saturday. Advised patient that her procedure will need to be rescheduled. She states that she does not want to push it out too far. Advised that I will talk with our procedure scheduler to see when we would be able to move it to.

## 2023-11-04 ENCOUNTER — Telehealth: Payer: Self-pay

## 2023-11-04 ENCOUNTER — Other Ambulatory Visit: Payer: Self-pay | Admitting: Licensed Clinical Social Worker

## 2023-11-04 ENCOUNTER — Ambulatory Visit: Payer: Self-pay

## 2023-11-04 NOTE — Telephone Encounter (Signed)
 Copied from CRM [id number]. Topic: Appointments - Scheduling Inquiry for Clinic >> Nov 04, 2023  1:23 PM Precious C wrote: Reason for CRM: Patient called reporting that she is currently sick and believes she may have COVID. She is requesting guidance from her provider, Dr. Joshua, on what steps to take moving forward, including whether she should come into the office or follow other instructions. Callback requested -[phone]

## 2023-11-04 NOTE — Patient Outreach (Signed)
 Complex Care Management   Visit Note  11/04/2023  Name:  [name] MRN: [id number] DOB: 05-29-1965  Situation: Referral received for Complex Care Management related to stress and anxiety issues; SDOH needs I obtained verbal consent from Patient.  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   ADD (attention deficit disorder)    Anxiety    Arthritis    both knees right worse than left   Carpal tunnel syndrome of right wrist    Chronic back pain    Dysrhythmia    history of Vtach   Enterococcus faecalis infection 04/29/2023   Essential hypertension, benign    Gallstones    History of smoking 06/22/2016   Hyperlipidemia associated with type 2 diabetes mellitus (HCC), on Zocor  04/04/2011   Hypertension associated with diabetes (HCC) 06/03/2008   MDD (major depressive disorder)    Memory impairment    post cardiac arrest   Migraines    Morbid obesity (HCC)    Morbid obesity with BMI of 50.0-59.9, adult (HCC) 07/12/2006   NICM (nonischemic cardiomyopathy) (HCC) 07/12/2006   01/16/18 ECHO:    - Procedure narrative: Transthoracic echocardiography. Image   quality was suboptimal. The study was technically difficult.   Intravenous contrast (Definity ) was administered. - Left ventricle: The cavity size was moderately dilated. Wall   thickness was increased in a pattern of mild LVH. Systolic   function was moderately to severely reduced. 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   Pseudomonas infection 04/29/2023   Spinal stenosis    back pain   Spondylosis without myelopathy or radiculopathy, lumbar region 12/04/2017   Type 2 diabetes mellitus with hyperglycemia (HCC)    Walker as ambulation aid     Assessment: Patient Reported Symptoms:  Cognitive  Cognitive Status: Alert and oriented to person, place, and time, Difficulties with attention and concentration Cognitive/Intellectual Conditions Management [RPT]: None reported or documented in medical history or problem list   Health Maintenance Behaviors: Stress management Health Facilitated by: Stress management  Neurological Neurological Review of Symptoms: Dizziness, Weakness, Headaches Neurological Management Strategies: Coping strategies  HEENT HEENT Symptoms Reported: No symptoms reported HEENT Management Strategies: Coping strategies    Cardiovascular Cardiovascular Symptoms Reported: Dizziness, Fatigue Does patient have uncontrolled Hypertension?: No Cardiovascular Management Strategies: Coping strategies  Respiratory Respiratory Symptoms Reported: Shortness of breath Other Respiratory Symptoms: fatigues when walking Respiratory Management Strategies: Adequate rest, Coping strategies  Endocrine Endocrine Symptoms Reported: Blurry vision, Weakness or fatigue    Gastrointestinal Gastrointestinal Symptoms Reported: No symptoms reported Additional Gastrointestinal Details: constipation issues Gastrointestinal Management Strategies: Coping strategies    Genitourinary Genitourinary Symptoms Reported: Frequency Additional Genitourinary Details: frequent urination Genitourinary Management Strategies: Adequate rest, Medication therapy  Integumentary Integumentary Symptoms Reported: Wound Additional Integumentary Details: wound area on left leg (slow healing) Daily dressing change Skin Management Strategies: Adequate rest, Coping strategies  Musculoskeletal Musculoskelatal Symptoms Reviewed: Limited mobility, Unsteady gait, Weakness, Muscle pain Musculoskeletal Management Strategies: Coping strategies    Risk for falls  Psychosocial Psychosocial Symptoms Reported: Anxiety - if selected complete GAD, Sadness - if selected complete PHQ 2-9, Depression - if selected complete PHQ  2-9 Additional Psychological Details: stress issues faced related to managing health needs Behavioral Management Strategies: Coping strategies Major Change/Loss/Stressor/Fears (CP):  Medical condition, self Techniques to Cope with Loss/Stress/Change: Counseling, Medication, Diversional activities Quality of Family Relationships: supportive Do you feel physically threatened by others?: No    11/04/2023    PHQ2-9 Depression Screening   Little interest or pleasure in doing things Several days  Feeling down, depressed, or hopeless Several days  PHQ-2 - Total Score 2  Trouble falling or staying asleep, or sleeping too much Several days  Feeling tired or having little energy More than half the days  Poor appetite or overeating  Several days  Feeling bad about yourself - or that you are a failure or have let yourself or your family down Several days  Trouble concentrating on things, such as reading the newspaper or watching television More than half the days  Moving or speaking so slowly that other people could have noticed.  Or the opposite - being so fidgety or restless that you have been moving around a lot more than usual Several days  Thoughts that you would be better off dead, or hurting yourself in some way Not at all  PHQ2-9 Total Score 10  If you checked off any problems, how difficult have these problems made it for you to do your work, take care of things at home, or get along with other people Somewhat difficult  Depression Interventions/Treatment Medication, Counseling, Currently on Treatment    Vitals:   BP is being monitored through PCP office   Medications Reviewed Today     Reviewed by Frances Ozell GORMAN KEN (Social Worker) on 11/04/23 at 1445  Med List Status: <None>   Medication Order Taking? Sig Documenting Provider Last Dose Status Informant  Accu-Chek Softclix Lancets lancets [id number] unknown Use as instructed Joshua Debby CROME, MD  Active Spouse/Significant Other   AMBULATORY NON FORMULARY MEDICATION [id number] unknown Off-loading wheel chair seat cusion Dispense 1 Dx code: M53.3 Use as needed Corey, Evan S, MD  Active Spouse/Significant Other  atorvastatin  (LIPITOR ) 80 MG tablet [id number] Yes TAKE 1 TABLET BY MOUTH EVERY DAY Joshua Debby CROME, MD  Active Spouse/Significant Other  Blood Glucose Monitoring Suppl DEVI [id number] unknown 1 each by Does not apply route in the morning, at noon, and at bedtime. May substitute to any manufacturer covered by patient's insurance. Elnor Lauraine BRAVO, NP  Active Spouse/Significant Other  collagenase  (SANTYL ) 250 UNIT/GM ointment [id number] unknown Apply 1 Application topically daily. [provider]  Active Spouse/Significant Other  dapagliflozin  propanediol (FARXIGA ) 10 MG TABS tablet [id number] Yes TAKE 1 TABLET BY MOUTH EVERY DAY Lavona Agent, MD  Active Spouse/Significant Other  furosemide  (LASIX ) 40 MG tablet [id number] Yes Take 1 tablet (40 mg total) by mouth daily. Lavona Agent, MD  Active Spouse/Significant Other  gentamicin ointment (GARAMYCIN) 0.1 % [id number] Unknown  Apply 1 Application topically at bedtime. [provider]  Active Spouse/Significant Other  Glucose Blood (BLOOD GLUCOSE TEST STRIPS) STRP [id number] unknown 1 each by In Vitro route in the morning, at noon, and at bedtime. May substitute to any manufacturer covered by patient's insurance. Elnor Lauraine BRAVO, NP  Active Spouse/Significant Other  hydrOXYzine  (ATARAX ) 25 MG tablet [id number] Yes TAKE 1 TABLET BY MOUTH THREE TIMES A DAY AS NEEDED Joshua Debby CROME, MD  Active Spouse/Significant Other  losartan  (COZAAR ) 25 MG tablet [id number] Yes Take 0.5 tablets (12.5 mg total) by mouth daily. Janene Boer, [place]  Active Spouse/Significant Other  magnesium  oxide (MAG-OX) 400 MG tablet [id number] Yes Take 1 tablet (400 mg total) by mouth at bedtime. Lavona Agent, MD  Active Spouse/Significant  Other  meclizine  (ANTIVERT ) 12.5 MG tablet [id number] unknown  Take 1 tablet (12.5 mg total) by mouth 3 (three) times daily as needed.  Patient not taking: Reported on 11/04/2023   Norleen Lynwood ORN, MD  Active Spouse/Significant Other  metFORMIN  (GLUCOPHAGE -XR) 500 MG 24 hr tablet [id number] Yes TAKE 1 TABLET BY MOUTH 2 TIMES DAILY WITH A MEAL. Joshua Debby CROME, MD  Active Spouse/Significant Other  metoprolol  succinate (TOPROL -XL) 50 MG 24 hr tablet [id number] yes TAKE 1 TABLET BY MOUTH DAILY. TAKE WITH OR IMMEDIATELY FOLLOWING A MEAL. Lavona Lynwood, MD  Active Spouse/Significant Other  morphine  (MS CONTIN ) 15 MG 12 hr tablet [id number] Yes Take 1 tablet (15 mg total) by mouth every 12 (twelve) hours.  Patient taking differently: Take 15 mg by mouth every 12 (twelve) hours as needed for pain.   Joshua Debby CROME, MD  Active Spouse/Significant Other  NON FORMULARY [id number] unknown Pt uses cpap nightly [provider]  Active Spouse/Significant Other  polyethylene glycol powder (GLYCOLAX /MIRALAX ) 17 GM/SCOOP powder [id number] unknown Take 1 capful (17 g) in water 2 (two) times daily.  Patient not taking: Reported on 11/04/2023   Alvia Corean CROME, FNP  Active Spouse/Significant Other  potassium chloride  SA (KLOR-CON  M) 20 MEQ tablet [id number] Yes TAKE 1 TABLET BY MOUTH EVERY DAY Lavona Lynwood, MD  Active Spouse/Significant Other  spironolactone  (ALDACTONE ) 25 MG tablet [id number] Yes TAKE 1/2 TABLET BY MOUTH EVERY DAY Lavona Lynwood, MD  Active Spouse/Significant Other            Recommendation:   PCP Follow-up Continue Current Plan of Care Take medications as prescribed Call LCSW as needed for SW support  Follow Up Plan:   Telephone follow up appointment date/time:  12/22/23 at 2:30 PM    [name]  MSW, LCSW [place]/[facility] Licensed Clinical Social Worker Direct Dial:  [phone] Fax:  [phone] Website:  [email or web address]

## 2023-11-04 NOTE — Patient Instructions (Signed)
 Visit Information  Thank you for taking time to visit with me today. Please don't hesitate to contact me if I can be of assistance to you before our next scheduled appointment.  Our next appointment is by telephone on 12/22/2023 at 2:30 PM   Please call the care guide team at [phone] if you need to cancel or reschedule your appointment.   Following is a copy of your care plan:   Goals Addressed             This Visit's Progress    VBCI Social Work Care Plan       Problems:   Mobility issues  Uses a walker or a cane as needed to help her walk. Physical therapy sessions for client have ended            Skin care issues (wound care issue)             Anxiety issues             Mental health  needs (sees counselor at [facility]             Depression issues             Some financial challenges              Memory issues              ADD                          CSW Clinical Goal(s):   Over the next 30  days the Patient will attend all scheduled Behavioral Health Counseling Appointments as scheduled to help with stress and anxiety issues AEB patient report of attending Behavioral Health appointments scheduled.              Over the next 30 days, client will use coping skills to manage depression issues faced AEB client report of reduction in depression symptoms.  Interventions:  Discussed client pain issues. Takes pain medication as prescribed             Discussed client transport needs. Client receives transport help from her spouse             Client said she has been ill since last Thursday,  She called PCP office earlier today and is waiting on call from nurse at PCP office             Client said she has had a fever and has aches and pains. She has other health issues faced. LCSW called office of PCP today and requested RN please call client today to talk with her about options for her care. Client has been wanting to talk with RN from PCP office about  client symptoms             Discussed skin care needs of client. Discussed nurse support for client in the home.  Nurse from [place] visits client in home one time weekly             Client has sleep difficulty. She spoke of neuropathy issues.                Client spoke of diurectic use. She spoke of frequency of bathroom visits               Discussed ambulation of client. She uses a cane or walker as needed. She has pain sometimes when walking  Discussed wound care of client. She has wound care for wound site on left leg (slow healing wound). She said she changes dressing daily on wound site               Encouraged client to call LCSW as needed for SW support               Encouraged client to talk with RN at PCP office about her current symptoms faced              Discussed memory issues of client. She is concerned about some memory loss.She said that her spouse helps her with memory issues and staying on schedule.  Her son is helping her currently with client needs. Client said that her spouse helps her with medication administration                            Patient Goals/Self-Care Activities:  Client to attend scheduled medical appointments             Client to take medications as prescribed              Client to use coping skills to manage depression issues faced              Client to call LCSW as needed for SW support              Client to participate in scheduled mental health appointments              Client to communicate with RN at PCP office about current symptoms of client              Plan:   LCSW to call client on 12/22/23 at 2:30 PM         Please go to [facility] [address] [place] [phone]) if you are experiencing a Mental Health or Behavioral Health Crisis or need someone to talk to.  The patient verbalized understanding of instructions, educational materials, and care plan provided today and  DECLINED offer to receive copy of patient instructions, educational materials, and care plan.     [name]  MSW, LCSW [place]/[facility] Licensed Clinical Social Worker Direct Dial:  [phone] Fax:  [phone] Website:  [email or web address]

## 2023-11-04 NOTE — Telephone Encounter (Signed)
 FYI Only or Action Required?: Action required by provider: Refusing ED, requesting call back asap for next steps, alerted CAL to ED refusal.  Patient was last seen in primary care on 08/18/2023 by Norleen Lynwood ORN, MD.  Called Nurse Triage reporting Sore Throat, Nasal Congestion, chest pain with cough, Shortness of Breath, Fever, and Generalized Body Aches.  Symptoms began several days ago.  Interventions attempted: OTC medications: tylenol  and Rest, hydration, or home remedies.  Symptoms are: rapidly worsening.  Triage Disposition: Go to ED Now (or PCP Triage)  Patient/caregiver understands and will follow disposition?: No, refuses disposition     Copied from CRM [id number]. Topic: Clinical - Red Word Triage >> Nov 04, 2023  3:00 PM Viola F wrote: Red Word that prompted transfer to Nurse Triage: Patient has been feeling sick since Thursday evening - sore throat, congestion, chest hurts when cough, shortness of breath, fever of 101.7 at one point, and body aches Reason for Disposition  Patient sounds very sick or weak to the triager  Answer Assessment - Initial Assessment Questions 1. ONSET: When did the muscle aches or body pains start?      Thursday evening 2. LOCATION: What part of your body is hurting? (e.g., entire body, arms, legs)      Chest hurts when coughing, feels heavy otherwise, feel like it's constant maybe but don't feel heart related, don't know Have some other body issues Always in lot of pain for chronic pain but lot more exaggerated lately because being sick when I died 3. SEVERITY: How bad is the pain? (Scale 1-10; or mild, moderate, severe)     When coughing 9/10, very significant and when finish cough takes couple of seconds then feel like a little sore then it goes away, more have to clear my chest, chest feels liquidy, unable to determine sputum color 6. OTHER SYMPTOMS: Do you have any other symptoms? (e.g., chest pain, cold or flu symptoms, rash,  weakness, weight loss)      Very very very congested, getting good oxygen through mouth, no SOB Sore throat, nasal congestion Don't want this to get worse Fever goes up and down throughout the day on its own, not taking anything in particular OTC Have taken tylenol , don't like to use it Always trying to clear throat Really bad burning feeling in throat Just feel like shit Feel out of breath talking to you Speaking in phrases Want to know if have covid/flu and what do to fix it Really not happy about this, was supposed to have ICD tomorrow Would like to do test or meds Don't want to go be around sick people Ow ow body is killing me, body aches Been waiting on call all day, called few hours ago, person talked to was total nutjob, she's on another planet, said would put it in as an emergency and turns out she didn't, feel like talking to myself Been waiting hours and hours for call back from Dr. Joshua office Significant cardiac hx   Advised ED, pt refusing at this time, requesting next steps from PCP office. Sending message to PCP office for call back asap. Advised hospital if any worsening or new symptoms. Alerted CAL to ED refusal.  Protocols used: Muscle Aches and Body Pain-A-AH

## 2023-11-05 ENCOUNTER — Ambulatory Visit (HOSPITAL_COMMUNITY): Admission: RE | Admit: 2023-11-05 | Source: Home / Self Care | Admitting: Cardiology

## 2023-11-05 ENCOUNTER — Ambulatory Visit: Payer: Self-pay

## 2023-11-05 ENCOUNTER — Encounter (HOSPITAL_COMMUNITY): Admission: RE | Payer: Self-pay | Source: Home / Self Care

## 2023-11-05 ENCOUNTER — Other Ambulatory Visit

## 2023-11-05 SURGERY — ICD IMPLANT

## 2023-11-05 NOTE — Telephone Encounter (Signed)
  FYI Only or Action Required?: Action required by provider: request for appointment and update on patient condition.  Patient was last seen in primary care on 08/18/2023 by Norleen Lynwood ORN, MD.  Called Nurse Triage reporting Nasal Congestion, Sore Throat, Fever, and Cough.  Symptoms began a week ago.  Interventions attempted: OTC medications: tylenol .  Symptoms are: gradually worsening.  Triage Disposition: Go to ED Now (or PCP Triage)  Patient/caregiver understands and will follow disposition?: No, wishes to speak with PCP Copied from CRM #[id number]. Topic: Clinical - Red Word Triage >> Nov 05, 2023  9:18 AM Aleatha C wrote: Red Word that prompted transfer to Nurse Triage: Patient says she is very sick, high fever, extreme congestion, and due to her other health conditions she is afraid that she can have a heart attack with how sick she's been for the last four days Reason for Disposition  Patient sounds very sick or weak to the triager  Answer Assessment - Initial Assessment Questions CAL at [facility], [place], notified. Patient refuses ED or to go to any other practice  1. SYMPTOMS: What is your main symptom or concern? (e.g., cough, fever, shortness of breath, muscle aches)     Fever, body aches, sore throat,  cough, congestion 2. ONSET: When did the symptoms start?      One week ago 3. COUGH: Do you have a cough? If Yes, ask: How bad is the cough?       Minor cough 4. FEVER: Do you have a fever? If Yes, ask: What is your temperature, how was it measured, and when did it start?     Fever present.  101.7 one day ago, today chills, no measured temp today 5. BREATHING DIFFICULTY: Are you having any difficulty breathing? (e.g., normal; shortness of breath, wheezing, unable to speak)      denies 6. BETTER-SAME-WORSE: Are you getting better, staying the same or getting worse compared to yesterday?  If getting worse, ask, In what way?     worse 7. OTHER SYMPTOMS:  Do you have any other symptoms?  (e.g., chills, fatigue, headache, loss of smell or taste, muscle pain, sore throat)     Chils, head ache fatigue 8. INFLUENZA EXPOSURE: Was there any known exposure to influenza (flu) before the symptoms began?      denies 9. INFLUENZA SUSPECTED: Why do you think you have influenza? (e.g., positive flu self-test at home, symptoms after exposure).     N/A 10. INFLUENZA VACCINE: Have you had the flu vaccine? If Yes, ask: When did you last get it?        11. HIGH RISK FOR COMPLICATIONS: Do you have any chronic medical problems? (e.g., asthma, heart or lung disease, obesity, weak immune system)       Obesity, cardiomyopathy, arrythimia 12. PREGNANCY: Is there any chance you are pregnant? When was your last menstrual period?       N/A 13. O2 SATURATION MONITOR:  Do you use an oxygen saturation monitor (pulse oximeter) at home? If Yes, ask What is your reading (oxygen level) today? What is your usual oxygen saturation reading? (e.g., 95%)  Protocols used: Influenza (Flu) Suspected-A-AH

## 2023-11-06 ENCOUNTER — Encounter: Payer: Self-pay | Admitting: Internal Medicine

## 2023-11-06 ENCOUNTER — Ambulatory Visit (HOSPITAL_BASED_OUTPATIENT_CLINIC_OR_DEPARTMENT_OTHER): Admitting: Internal Medicine

## 2023-11-06 ENCOUNTER — Ambulatory Visit (INDEPENDENT_AMBULATORY_CARE_PROVIDER_SITE_OTHER): Admitting: Internal Medicine

## 2023-11-06 VITALS — BP 110/80 | HR 73 | Temp 98.3°F | Ht 64.0 in | Wt 228.0 lb

## 2023-11-06 DIAGNOSIS — R42 Dizziness and giddiness: Secondary | ICD-10-CM | POA: Diagnosis not present

## 2023-11-06 DIAGNOSIS — U071 COVID-19: Secondary | ICD-10-CM

## 2023-11-06 DIAGNOSIS — R52 Pain, unspecified: Secondary | ICD-10-CM | POA: Diagnosis not present

## 2023-11-06 DIAGNOSIS — I5042 Chronic combined systolic (congestive) and diastolic (congestive) heart failure: Secondary | ICD-10-CM

## 2023-11-06 DIAGNOSIS — B9689 Other specified bacterial agents as the cause of diseases classified elsewhere: Secondary | ICD-10-CM | POA: Diagnosis not present

## 2023-11-06 DIAGNOSIS — E1165 Type 2 diabetes mellitus with hyperglycemia: Secondary | ICD-10-CM | POA: Diagnosis not present

## 2023-11-06 DIAGNOSIS — R509 Fever, unspecified: Secondary | ICD-10-CM

## 2023-11-06 DIAGNOSIS — J029 Acute pharyngitis, unspecified: Secondary | ICD-10-CM | POA: Diagnosis not present

## 2023-11-06 DIAGNOSIS — T8149XA Infection following a procedure, other surgical site, initial encounter: Secondary | ICD-10-CM | POA: Diagnosis not present

## 2023-11-06 LAB — COMPREHENSIVE METABOLIC PANEL WITH GFR
ALT: 28 U/L (ref 0–35)
AST: 34 U/L (ref 0–37)
Albumin: 3.9 g/dL (ref 3.5–5.2)
Alkaline Phosphatase: 39 U/L (ref 39–117)
BUN: 28 mg/dL — ABNORMAL HIGH (ref 6–23)
CO2: 28 meq/L (ref 19–32)
Calcium: 9.4 mg/dL (ref 8.4–10.5)
Chloride: 100 meq/L (ref 96–112)
Creatinine, Ser: 1.02 mg/dL (ref 0.40–1.20)
GFR: 60.67 mL/min (ref >60.00)
Glucose, Bld: 128 mg/dL — ABNORMAL HIGH (ref 70–99)
Potassium: 4.1 meq/L (ref 3.5–5.1)
Sodium: 138 meq/L (ref 135–145)
Total Bilirubin: 1.6 mg/dL — ABNORMAL HIGH (ref 0.2–1.2)
Total Protein: 7.1 g/dL (ref 6.0–8.3)

## 2023-11-06 LAB — POC COVID19 BINAXNOW: SARS Coronavirus 2 Ag: POSITIVE — AB

## 2023-11-06 LAB — POCT INFLUENZA A/B
Influenza A, POC: NEGATIVE
Influenza B, POC: NEGATIVE

## 2023-11-06 LAB — HEMOGLOBIN A1C: Hgb A1c MFr Bld: 6.3 % (ref 4.6–6.5)

## 2023-11-06 MED ORDER — NIRMATRELVIR/RITONAVIR (PAXLOVID)TABLET: 3.0000 | ORAL_TABLET | Freq: Two times a day (BID) | ORAL | 0 refills | Status: AC

## 2023-11-06 NOTE — Telephone Encounter (Signed)
 Patient tested positive for Covid on today. It is recommended to move procedure out 4 weeks, if possible (10/9 or after).

## 2023-11-06 NOTE — Telephone Encounter (Signed)
 Called pt to inform her that we would need to move her procedure out 4 weeks due to her testing positive for Covid today. She was very upset and angry about this. She said she has waited a very long time for this procedure and it does not need to keep getting put off.  She said that her symptoms started on 9/4 and even though she tested positive today, the MD told her she was no long contagious. I explained to her that it was for her safety was the reason we recommended moving it out. She does not have a fever, she does sound a little congested and said the only time she coughs is when she needs to clear her throat.  I told her I would speak with Dr. Inocencio about this and get his opinion. Her procedure is scheduled on 9/29 which is 3 weeks and 4 days after the day her symptoms started.  Is it ok to leave her on 9/29 and call her the week before to reassess her symptoms?

## 2023-11-06 NOTE — Telephone Encounter (Signed)
 Pt made aware Dr. Inocencio ok to proceed with scheduled procedure at [facility] on 9/29.  She understands if improvement is not achieved before the 29th, we may still have to move her out to October, worst case.   (I also let pt know that there is a chance 9/29 might be cancelled.  Aware if this does happen we will move her to the next day in [place]).

## 2023-11-06 NOTE — Assessment & Plan Note (Signed)
 The patient will contact ICD implant team to inform them of her current situation with acute COVID-19 infection.  ICD implant procedure was planned for 11/24/2023

## 2023-11-06 NOTE — Patient Instructions (Signed)
 Take zinc  50 mg a day for 1 week, vitamin C  1000 mg daily for 1 week, vitamin D2 50,000 units weekly for 2 months (unless  taking vitamin D  daily already), an antioxidant Quercetin 500 mg twice a day for 1 week (if you can get it quick enough). Take Allegra or Benadryl .  Maintain good oral hydration and take Tylenol  for high fever.  Call if problems. Isolate for 5 days, then wear a mask for 5 days per CDC.

## 2023-11-06 NOTE — Progress Notes (Signed)
 Subjective:  Patient ID: [name], female    DOB: 10-27-1965  Age: 58 y.o. MRN: [id number]  CC: Fever (Fever, sore throat, congestion, aches and pains. Started around Thursday. Tylenol  is not helping. )   HPI [name] presents for Fever (Fever, sore throat, congestion, aches and pains. Started around Thursday. Tylenol  is not helping. ) Feeling worse   Outpatient Medications Prior to Visit  Medication Sig Dispense Refill   Accu-Chek Softclix Lancets lancets Use as instructed 100 each 12   AMBULATORY NON FORMULARY MEDICATION Off-loading wheel chair seat cusion Dispense 1 Dx code: M53.3 Use as needed 1 Units 0   atorvastatin  (LIPITOR ) 80 MG tablet TAKE 1 TABLET BY MOUTH EVERY DAY 90 tablet 0   Blood Glucose Monitoring Suppl DEVI 1 each by Does not apply route in the morning, at noon, and at bedtime. May substitute to any manufacturer covered by patient's insurance. 1 each 0   collagenase  (SANTYL ) 250 UNIT/GM ointment Apply 1 Application topically daily.     dapagliflozin  propanediol (FARXIGA ) 10 MG TABS tablet TAKE 1 TABLET BY MOUTH EVERY DAY 90 tablet 3   furosemide  (LASIX ) 40 MG tablet Take 1 tablet (40 mg total) by mouth daily. 90 tablet 3   gentamicin ointment (GARAMYCIN) 0.1 % Apply 1 Application topically at bedtime.     Glucose Blood (BLOOD GLUCOSE TEST STRIPS) STRP 1 each by In Vitro route in the morning, at noon, and at bedtime. May substitute to any manufacturer covered by patient's insurance. 100 strip 11   hydrOXYzine  (ATARAX ) 25 MG tablet TAKE 1 TABLET BY MOUTH THREE TIMES A DAY AS NEEDED 270 tablet 0   losartan  (COZAAR ) 25 MG tablet Take 0.5 tablets (12.5 mg total) by mouth daily.     magnesium  oxide (MAG-OX) 400 MG tablet Take 1 tablet (400 mg total) by mouth at bedtime. 90 tablet 1   meclizine  (ANTIVERT ) 12.5 MG tablet Take 1 tablet (12.5 mg total) by mouth 3 (three) times daily as needed. 40 tablet 1   metFORMIN  (GLUCOPHAGE -XR) 500 MG 24 hr tablet TAKE 1 TABLET  BY MOUTH 2 TIMES DAILY WITH A MEAL. 180 tablet 1   metoprolol  succinate (TOPROL -XL) 50 MG 24 hr tablet TAKE 1 TABLET BY MOUTH DAILY. TAKE WITH OR IMMEDIATELY FOLLOWING A MEAL. 90 tablet 3   morphine  (MS CONTIN ) 15 MG 12 hr tablet Take 1 tablet (15 mg total) by mouth every 12 (twelve) hours. (Patient taking differently: Take 15 mg by mouth every 12 (twelve) hours as needed for pain.) 60 tablet 0   NON FORMULARY Pt uses cpap nightly     polyethylene glycol powder (GLYCOLAX /MIRALAX ) 17 GM/SCOOP powder Take 1 capful (17 g) in water 2 (two) times daily. 765 g 0   potassium chloride  SA (KLOR-CON  M) 20 MEQ tablet TAKE 1 TABLET BY MOUTH EVERY DAY 90 tablet 3   spironolactone  (ALDACTONE ) 25 MG tablet TAKE 1/2 TABLET BY MOUTH EVERY DAY 45 tablet 2   No facility-administered medications prior to visit.    ROS: Review of Systems  Constitutional:  Positive for appetite change, chills and fatigue. Negative for activity change and unexpected weight change.  HENT:  Positive for congestion, postnasal drip, rhinorrhea and sore throat. Negative for mouth sores and sinus pressure.   Eyes:  Negative for visual disturbance.  Respiratory:  Negative for cough, chest tightness, shortness of breath and wheezing.   Cardiovascular:  Negative for chest pain and leg swelling.  Gastrointestinal:  Negative for abdominal pain and  nausea.  Genitourinary:  Negative for difficulty urinating, frequency and vaginal pain.  Musculoskeletal:  Positive for arthralgias, back pain and gait problem.  Skin:  Positive for wound. Negative for pallor and rash.  Neurological:  Negative for dizziness, tremors, weakness, numbness and headaches.  Psychiatric/Behavioral:  Negative for confusion and sleep disturbance.     Objective:  BP 110/80   Pulse 73   Temp 98.3 F (36.8 C) (Oral)   Ht 5' 4 (1.626 m)   Wt 228 lb (103.4 kg)   LMP  (LMP Unknown)   SpO2 96%   BMI 39.14 kg/m   BP Readings from Last 3 Encounters:  11/06/23 110/80   09/17/23 119/81  09/05/23 128/68    Wt Readings from Last 3 Encounters:  11/06/23 228 lb (103.4 kg)  09/17/23 225 lb 3.2 oz (102.2 kg)  09/05/23 212 lb (96.2 kg)    Physical Exam Constitutional:      General: She is not in acute distress.    Appearance: She is well-developed. She is obese. She is ill-appearing. She is not toxic-appearing.  HENT:     Head: Normocephalic.     Right Ear: External ear normal.     Left Ear: External ear normal.     Nose: Nose normal.  Eyes:     General:        Right eye: No discharge.        Left eye: No discharge.     Conjunctiva/sclera: Conjunctivae normal.     Pupils: Pupils are equal, round, and reactive to light.  Neck:     Thyroid : No thyromegaly.     Vascular: No JVD.     Trachea: No tracheal deviation.  Cardiovascular:     Rate and Rhythm: Normal rate and regular rhythm.     Heart sounds: Normal heart sounds.  Pulmonary:     Effort: No respiratory distress.     Breath sounds: No stridor. No wheezing or rales.  Abdominal:     General: Bowel sounds are normal. There is no distension.     Palpations: Abdomen is soft. There is no mass.     Tenderness: There is no abdominal tenderness. There is no guarding or rebound.  Musculoskeletal:        General: Tenderness present.     Cervical back: Normal range of motion and neck supple. No rigidity.  Lymphadenopathy:     Cervical: No cervical adenopathy.  Skin:    Findings: No erythema or rash.  Neurological:     Cranial Nerves: No cranial nerve deficit.     Motor: No abnormal muscle tone.     Coordination: Coordination normal.     Gait: Gait abnormal.     Deep Tendon Reflexes: Reflexes normal.  Psychiatric:        Behavior: Behavior normal.        Thought Content: Thought content normal.        Judgment: Judgment normal.   Using cane Acutely ill appearing - mild  Lab Results  Component Value Date   WBC 10.8 10/30/2023   HGB 15.7 10/30/2023   HCT 48.6 (H) 10/30/2023   PLT 322  10/30/2023   GLUCOSE 128 (H) 11/06/2023   CHOL 194 08/13/2022   TRIG 208 (H) 12/06/2022   HDL 48.90 08/13/2022   LDLDIRECT 143.8 04/03/2011   LDLCALC 117 (H) 08/13/2022   ALT 28 11/06/2023   AST 34 11/06/2023   NA 138 11/06/2023   K 4.1 11/06/2023   CL 100 11/06/2023  CREATININE 1.02 11/06/2023   BUN 28 (H) 11/06/2023   CO2 28 11/06/2023   TSH 4.29 01/15/2023   INR 1.2 02/25/2022   HGBA1C 6.3 11/06/2023   MICROALBUR 1.0 05/22/2023    MR KNEE LEFT W WO CONTRAST Result Date: 10/14/2023 MR KNEE WITHOUT THEN WITH IV CONTRAST LEFT COMPARISON: None. CLINICAL HISTORY: Knee pain. 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 There is no focal full-thickness cartilage defect or subchondral reactive edema identified. There are marginal osteophytes. There is a small reactive joint effusion and small Baker's cyst. There is severe thickening at the mid and lower patellar tendon consistent with severe tendinitis. There is likely an overlying wound. Correlation for patellar tendon symptoms. There is no focal enhancing collection. There is likely moderate granulation tissue surrounding the patellar tendon. Ligaments: The ACL, PCL, MCL and fibular collateral ligament are intact. Menisci: There is a degenerative tear of the posterior horn and body of the medial meniscus. No displaced meniscal flap is identified. There is also horizontal tear of the body and posterior horn of the lateral meniscus. 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 There is a markedly abnormal patellar tendon with a probable prepatellar wound and thickening of the patellar tendon. There are intact fibers. Correlation for severe patellar tendinitis. There is likely surrounding enhancing granulation tissue. No focal collection is  appreciated. Clinical correlation. Findings suggesting a horizontal nondisplaced tear as the posterior horn and body of the medial and lateral meniscus. No displaced meniscal tear is appreciated. Electronically signed by: Norleen Satchel MD 10/14/2023 03:29 PM EDT RP Workstation: [id number]    Assessment & Plan:   Problem List Items Addressed This Visit     Chronic combined systolic and diastolic heart failure (HCC)   The patient will contact ICD implant team to inform them of her current situation with acute COVID-19 infection.  ICD implant procedure was planned for 11/24/2023      COVID-19   New Prescribed Paxlovid .  Her creatinine was normal.  Obtain c-Met with GFR. OTC meds -see my suggestions.  Patient declined prescription cough syrup Check CMET/GFR Go to ER if worse      Relevant Medications   nirmatrelvir /ritonavir  (PAXLOVID ) 20 x 150 MG & 10 x 100MG  TABS   Type 2 diabetes mellitus with hyperglycemia (HCC)   Obtain CMET, hemoglobin A1c. The patient states her sugars been good.      Relevant Orders   Comprehensive metabolic panel with GFR (Completed)   Hemoglobin A1c (Completed)   Other Visit Diagnoses       Fever, unspecified fever cause    -  Primary   Relevant Orders   POCT Influenza A/B (Completed)   POC COVID-19 BinaxNow (Completed)     Sore throat       Relevant Orders   POCT Influenza A/B (Completed)   POC COVID-19 BinaxNow (Completed)     Body aches       Relevant Orders   POCT Influenza A/B (Completed)   POC COVID-19 BinaxNow (Completed)     Dizzy       Relevant Orders   POCT Influenza A/B (Completed)   POC COVID-19 BinaxNow (Completed)         Meds ordered this encounter  Medications   nirmatrelvir /ritonavir  (PAXLOVID ) 20 x 150 MG & 10 x 100MG  TABS  Sig: Take 3 tablets by mouth 2 (two) times daily for 5 days. Creat 1.01 GFR pending    Dispense:  30 tablet    Refill:  0      Follow-up: Return in about 2 weeks (around 11/20/2023) for f/u with  PCP.  Marolyn Noel, MD

## 2023-11-06 NOTE — Assessment & Plan Note (Signed)
 Obtain CMET, hemoglobin A1c. The patient states her sugars been good.

## 2023-11-06 NOTE — Assessment & Plan Note (Addendum)
 New Prescribed Paxlovid .  Her creatinine was normal.  Obtain c-Met with GFR. OTC meds -see my suggestions.  Patient declined prescription cough syrup Check CMET/GFR Go to ER if worse

## 2023-11-07 NOTE — Telephone Encounter (Signed)
 Will follow up with patient prior to procedure to reassess symptoms.

## 2023-11-07 NOTE — Telephone Encounter (Signed)
 Patient was seen in office 9/11 by Dr. Harley

## 2023-11-07 NOTE — Telephone Encounter (Signed)
 Patient has been seen in the office by Dr. Harley

## 2023-11-09 ENCOUNTER — Other Ambulatory Visit: Payer: Self-pay | Admitting: Internal Medicine

## 2023-11-10 ENCOUNTER — Ambulatory Visit: Payer: Medicare Other | Admitting: Psychology

## 2023-11-10 NOTE — Telephone Encounter (Signed)
 Front desk to reach out to schedule follow up, you wanted her to stop after her ICD placement right?

## 2023-11-12 DIAGNOSIS — T8149XA Infection following a procedure, other surgical site, initial encounter: Secondary | ICD-10-CM | POA: Diagnosis not present

## 2023-11-12 DIAGNOSIS — B9689 Other specified bacterial agents as the cause of diseases classified elsewhere: Secondary | ICD-10-CM | POA: Diagnosis not present

## 2023-11-17 ENCOUNTER — Ambulatory Visit: Admitting: Student

## 2023-11-17 ENCOUNTER — Telehealth: Payer: Self-pay | Admitting: Student

## 2023-11-17 ENCOUNTER — Ambulatory Visit (HOSPITAL_COMMUNITY): Admitting: Mental Health

## 2023-11-17 NOTE — Progress Notes (Deleted)
   Referring Provider Joshua Debby CROME, MD [address] [place],  [place] [address]   CC: No chief complaint on file.     [name] is an 58 y.o. female.  HPI: Patient is a 58 year old female with a history of a large wound to her left lower extremity. She has undergone several debridements and is here for further follow-up of her wound.   Patient was last seen in the clinic on 09/03/2023.  At this visit, patient was overall doing well.  Wound to the left lower extremity was approximately 8 cm x 10 cm.  There is still little bit of exudate over the wound centrally, but granulation tissue throughout the majority of the wound.  Patient had been seeing wound care at the time as well.  Plan was for patient to continue with dressing changes as prescribed by wound care.  Today,  Review of Systems General: ***  Physical Exam    11/06/2023   11:51 AM 11/04/2023    2:52 PM 09/29/2023    3:21 PM  Vitals with BMI  Height 5' 4    Weight 228 lbs    BMI 39.12    Systolic 110 -- --  Diastolic 80 -- --  Pulse 73      General:  No acute distress,  Alert and oriented, Non-Toxic, Normal speech and affect ***   Assessment/Plan ***  [name] 11/17/2023, 9:03 AM

## 2023-11-17 NOTE — Telephone Encounter (Signed)
 Spoke with patient to discuss upcoming procedure.   Confirmed patient is scheduled for a Implantable cardioverter defibrilator (ICD) on Monday, September 29 with Dr. Soyla Norton. Instructed patient to arrive at the [facility] of [facility], [address], [place] [address] and check in at 7:30 AM.   Labs completed  Any recent signs of acute illness or been started on antibiotics? Pt reports she has recovered from Covid and denies any active symptoms.   Any new medications started? No Any medications to hold? HOLD: Dapagliflozin  (Farxiga ) for 3 days prior to the procedure. Last dose on Thursday, September 25. Medication instructions:   On the morning of your procedure DO NOT take any medication. No eating or drinking after midnight prior to procedure.   The night before your procedure and the morning of your procedure, wash thoroughly with the CHG surgical soap from the neck down, paying special attention to the area where your procedure will be performed.  Advised of plan to go home the same day and will only stay overnight if medically necessary. You MUST have a responsible adult to drive you home and MUST be with you the first 24 hours after you arrive home.  Patient verbalized understanding to all instructions provided and agreed to proceed with procedure.

## 2023-11-17 NOTE — Telephone Encounter (Signed)
 Pt called 18 mins after her 11am apt, to let us  know she was going to be late, the PA CE has back to back pts, CE said she needed to be r/s, pt did not want to r/s at this time due to an upcoming sx and she will have to figure out her sx after that. I let Elententy PA know, and she said pt can f/u at anytime in the future

## 2023-11-18 ENCOUNTER — Telehealth: Payer: Self-pay

## 2023-11-18 ENCOUNTER — Telehealth (HOSPITAL_COMMUNITY): Payer: Self-pay | Admitting: Mental Health

## 2023-11-18 ENCOUNTER — Ambulatory Visit (HOSPITAL_COMMUNITY): Admitting: Mental Health

## 2023-11-18 ENCOUNTER — Ambulatory Visit

## 2023-11-18 DIAGNOSIS — B9689 Other specified bacterial agents as the cause of diseases classified elsewhere: Secondary | ICD-10-CM | POA: Diagnosis not present

## 2023-11-18 DIAGNOSIS — T8149XA Infection following a procedure, other surgical site, initial encounter: Secondary | ICD-10-CM | POA: Diagnosis not present

## 2023-11-18 NOTE — Telephone Encounter (Signed)
 Therapist contacted patient after failing to present to scheduled in person appointment 9/23 @ 2pm. No answer, left HIPAA message to remind of upcoming appointment for therapy 11/27/23.

## 2023-11-18 NOTE — Telephone Encounter (Signed)
 I called pt to inform her that her ICD Implant would be moved from [facility] on 9/29 to [facility] in [place] on 9/30 at 5:00 pm with Dr. Inocencio.  I have notified [place] with [employer]. I have cancelled procedure with [facility].  I will send updated instruction letter to pt via MyChart per pt's request.   I spent 35 minutes on the phone with the patient answering questions that she had concerning her procedure. I messaged Sherri (Dr. Inocencio RN) and had her come to the phone. She spoke to the patient and told her that she would need to come in to speak with Dr. Inocencio since she had so many questions and didn't understand why she was getting the ICD. Pt agreed to this appt.   She is concerned that after she gets the ICD she will lose her Medicare/Medicaid and will not be able to afford the monitoring fee for her device.  She wanted to know how long she needs to wait to have dental work done - per [employer], she should wait at least 6 weeks.  Pt is scheduled to see Dr. Inocencio on 9/25.

## 2023-11-19 ENCOUNTER — Encounter (HOSPITAL_BASED_OUTPATIENT_CLINIC_OR_DEPARTMENT_OTHER): Attending: Internal Medicine | Admitting: Internal Medicine

## 2023-11-19 DIAGNOSIS — Y838 Other surgical procedures as the cause of abnormal reaction of the patient, or of later complication, without mention of misadventure at the time of the procedure: Secondary | ICD-10-CM | POA: Insufficient documentation

## 2023-11-19 DIAGNOSIS — L97828 Non-pressure chronic ulcer of other part of left lower leg with other specified severity: Secondary | ICD-10-CM | POA: Diagnosis not present

## 2023-11-19 DIAGNOSIS — T8140XA Infection following a procedure, unspecified, initial encounter: Secondary | ICD-10-CM | POA: Diagnosis not present

## 2023-11-19 DIAGNOSIS — I469 Cardiac arrest, cause unspecified: Secondary | ICD-10-CM | POA: Diagnosis not present

## 2023-11-19 DIAGNOSIS — I5042 Chronic combined systolic (congestive) and diastolic (congestive) heart failure: Secondary | ICD-10-CM | POA: Insufficient documentation

## 2023-11-19 DIAGNOSIS — E119 Type 2 diabetes mellitus without complications: Secondary | ICD-10-CM | POA: Diagnosis not present

## 2023-11-19 DIAGNOSIS — Z8674 Personal history of sudden cardiac arrest: Secondary | ICD-10-CM | POA: Diagnosis not present

## 2023-11-19 DIAGNOSIS — T798XXA Other early complications of trauma, initial encounter: Secondary | ICD-10-CM | POA: Diagnosis not present

## 2023-11-20 ENCOUNTER — Ambulatory Visit: Attending: Cardiology | Admitting: Cardiology

## 2023-11-20 ENCOUNTER — Encounter: Payer: Self-pay | Admitting: Cardiology

## 2023-11-20 VITALS — BP 132/85 | HR 84 | Ht 64.0 in | Wt 226.0 lb

## 2023-11-20 DIAGNOSIS — I5022 Chronic systolic (congestive) heart failure: Secondary | ICD-10-CM | POA: Insufficient documentation

## 2023-11-20 DIAGNOSIS — I469 Cardiac arrest, cause unspecified: Secondary | ICD-10-CM | POA: Diagnosis not present

## 2023-11-20 NOTE — Progress Notes (Signed)
  Electrophysiology Office Note:   Date:  11/20/2023  ID:  [name], DOB Jul 06, 1965, MRN [id number]  Primary Cardiologist: Lynwood Schilling, MD Primary Heart Failure: None Electrophysiologist: Javen Ridings [name], MD      History of Present Illness:   [name] is a 58 y.o. 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   Since last being seen in our clinic the patient reports doing well.  She presents today as she is having questions on her ICD implant upcoming next week.  She has no acute cardiac complaints at this time.  she denies chest pain, palpitations, dyspnea, PND, orthopnea, nausea, vomiting, dizziness, syncope, edema, weight gain, or early satiety.   Review of systems complete and found to be negative unless listed in HPI.   EP Information / Studies Reviewed:    EKG is not ordered today. EKG from 07/03/2023 reviewed which showed sinus rhythm, PVC        Risk Assessment/Calculations:             Physical Exam:   VS:  BP 132/85 (BP Location: Right Arm, Patient Position: Sitting, Cuff Size: Large)   Pulse 84   Ht 5' 4 (1.626 m)   Wt 226 lb (102.5 kg)   LMP  (LMP Unknown)   SpO2 98%   BMI 38.79 kg/m    Wt Readings from Last 3 Encounters:  11/20/23 226 lb (102.5 kg)  11/06/23 228 lb (103.4 kg)  09/17/23 225 lb 3.2 oz (102.2 kg)     GEN: Well nourished, well developed in no acute distress NECK: No JVD; No carotid bruits CARDIAC: Regular rate and rhythm, no murmurs, rubs, gallops RESPIRATORY:  Clear to auscultation without rales, wheezing or rhonchi  ABDOMEN: Soft, non-tender, non-distended EXTREMITIES:  No edema; No deformity   ASSESSMENT AND PLAN:    1.  Out-of-hospital cardiac arrest: Due to ventricular fibrillation.  Was discharged with a LifeVest but is no longer wearing.  Has plans for ICD implant next week.  All  questions were answered.  2.  Chronic systolic heart failure: On optimal medical therapy.  No obvious volume overload.  3.  Obstructive sleep apnea: CPAP compliance encouraged  Follow up with EP Team as usual post procedure  Signed, Tery Hoeger [name], MD

## 2023-11-20 NOTE — Patient Instructions (Addendum)
 Medication Instructions:  Your physician recommends that you continue on your current medications as directed. Please refer to the Current Medication list given to you today.  *If you need a refill on your cardiac medications before your next appointment, please call your pharmacy*   Testing/Procedures: ICD Implant - 9/30  Follow-Up: At [facility], you and your health needs are our priority.  As part of our continuing mission to provide you with exceptional heart care, our providers are all part of one team.  This team includes your primary Cardiologist (physician) and Advanced Practice Providers or APPs (Physician Assistants and Nurse Practitioners) who all work together to provide you with the care you need, when you need it.  Your next appointment:   We will contact you to arrange your post-procedure appointments

## 2023-11-20 NOTE — H&P (View-Only) (Signed)
  Electrophysiology Office Note:   Date:  11/20/2023  ID:  [name], DOB Jul 06, 1965, MRN [id number]  Primary Cardiologist: Lynwood Schilling, MD Primary Heart Failure: None Electrophysiologist: Javen Ridings [name], MD      History of Present Illness:   [name] is a 58 y.o. 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   Since last being seen in our clinic the patient reports doing well.  She presents today as she is having questions on her ICD implant upcoming next week.  She has no acute cardiac complaints at this time.  she denies chest pain, palpitations, dyspnea, PND, orthopnea, nausea, vomiting, dizziness, syncope, edema, weight gain, or early satiety.   Review of systems complete and found to be negative unless listed in HPI.   EP Information / Studies Reviewed:    EKG is not ordered today. EKG from 07/03/2023 reviewed which showed sinus rhythm, PVC        Risk Assessment/Calculations:             Physical Exam:   VS:  BP 132/85 (BP Location: Right Arm, Patient Position: Sitting, Cuff Size: Large)   Pulse 84   Ht 5' 4 (1.626 m)   Wt 226 lb (102.5 kg)   LMP  (LMP Unknown)   SpO2 98%   BMI 38.79 kg/m    Wt Readings from Last 3 Encounters:  11/20/23 226 lb (102.5 kg)  11/06/23 228 lb (103.4 kg)  09/17/23 225 lb 3.2 oz (102.2 kg)     GEN: Well nourished, well developed in no acute distress NECK: No JVD; No carotid bruits CARDIAC: Regular rate and rhythm, no murmurs, rubs, gallops RESPIRATORY:  Clear to auscultation without rales, wheezing or rhonchi  ABDOMEN: Soft, non-tender, non-distended EXTREMITIES:  No edema; No deformity   ASSESSMENT AND PLAN:    1.  Out-of-hospital cardiac arrest: Due to ventricular fibrillation.  Was discharged with a LifeVest but is no longer wearing.  Has plans for ICD implant next week.  All  questions were answered.  2.  Chronic systolic heart failure: On optimal medical therapy.  No obvious volume overload.  3.  Obstructive sleep apnea: CPAP compliance encouraged  Follow up with EP Team as usual post procedure  Signed, Tery Hoeger [name], MD

## 2023-11-24 ENCOUNTER — Telehealth (HOSPITAL_COMMUNITY): Payer: Self-pay

## 2023-11-24 ENCOUNTER — Encounter: Admission: RE | Payer: Self-pay | Source: Home / Self Care

## 2023-11-24 ENCOUNTER — Ambulatory Visit: Admission: RE | Admit: 2023-11-24 | Source: Home / Self Care | Admitting: Cardiology

## 2023-11-24 DIAGNOSIS — I5022 Chronic systolic (congestive) heart failure: Secondary | ICD-10-CM

## 2023-11-24 SURGERY — ICD IMPLANT
Anesthesia: Moderate Sedation

## 2023-11-24 NOTE — Telephone Encounter (Signed)
 Received a call from patient/spouse with multiple questions regarding procedure that was rescheduled from today at [facility] to 9/30 at [facility] and requesting an updated instructions letter sent via MyChart. All questions answered, instructions reviewed and will be sent to MyChart as requested. Patient/spouse verbalized understanding.

## 2023-11-24 NOTE — Pre-Procedure Instructions (Signed)
 Spoke with patient and husband regarding procedure for tomorrow.  Instructed patient on the following items: Arrival time 1:00, new arrival time procedure ehas been moved up. Nothing to eat or drink after midnight No meds AM of procedure Responsible person to drive you home and stay with you for 24 hrs Wash with special soap night before and morning of procedure

## 2023-11-25 ENCOUNTER — Encounter (HOSPITAL_COMMUNITY): Payer: Self-pay | Admitting: Cardiology

## 2023-11-25 ENCOUNTER — Ambulatory Visit (HOSPITAL_COMMUNITY)
Admission: RE | Admit: 2023-11-25 | Discharge: 2023-11-25 | Disposition: A | Discharge status: Home / Self Care | Attending: Cardiology | Admitting: Cardiology

## 2023-11-25 ENCOUNTER — Ambulatory Visit (HOSPITAL_COMMUNITY)

## 2023-11-25 ENCOUNTER — Other Ambulatory Visit: Payer: Self-pay

## 2023-11-25 ENCOUNTER — Encounter (HOSPITAL_COMMUNITY)
Admission: RE | Disposition: A | Discharge status: Home / Self Care | Payer: Self-pay | Source: Home / Self Care | Attending: Cardiology

## 2023-11-25 DIAGNOSIS — N189 Chronic kidney disease, unspecified: Secondary | ICD-10-CM | POA: Diagnosis not present

## 2023-11-25 DIAGNOSIS — I5042 Chronic combined systolic (congestive) and diastolic (congestive) heart failure: Secondary | ICD-10-CM | POA: Diagnosis not present

## 2023-11-25 DIAGNOSIS — E1122 Type 2 diabetes mellitus with diabetic chronic kidney disease: Secondary | ICD-10-CM | POA: Diagnosis not present

## 2023-11-25 DIAGNOSIS — Z8674 Personal history of sudden cardiac arrest: Secondary | ICD-10-CM | POA: Diagnosis not present

## 2023-11-25 DIAGNOSIS — I5022 Chronic systolic (congestive) heart failure: Secondary | ICD-10-CM | POA: Diagnosis not present

## 2023-11-25 DIAGNOSIS — I130 Hypertensive heart and chronic kidney disease with heart failure and stage 1 through stage 4 chronic kidney disease, or unspecified chronic kidney disease: Secondary | ICD-10-CM | POA: Insufficient documentation

## 2023-11-25 DIAGNOSIS — I4901 Ventricular fibrillation: Secondary | ICD-10-CM | POA: Diagnosis not present

## 2023-11-25 DIAGNOSIS — I428 Other cardiomyopathies: Secondary | ICD-10-CM | POA: Insufficient documentation

## 2023-11-25 DIAGNOSIS — I429 Cardiomyopathy, unspecified: Secondary | ICD-10-CM | POA: Diagnosis not present

## 2023-11-25 DIAGNOSIS — Z95810 Presence of automatic (implantable) cardiac defibrillator: Secondary | ICD-10-CM | POA: Diagnosis not present

## 2023-11-25 DIAGNOSIS — G4733 Obstructive sleep apnea (adult) (pediatric): Secondary | ICD-10-CM | POA: Diagnosis not present

## 2023-11-25 HISTORY — PX: ICD IMPLANT: EP1208

## 2023-11-25 LAB — GLUCOSE, CAPILLARY: Glucose-Capillary: 108 mg/dL — ABNORMAL HIGH (ref 70–99)

## 2023-11-25 SURGERY — ICD IMPLANT
Anesthesia: LOCAL

## 2023-11-25 MED ORDER — CHLORHEXIDINE GLUCONATE 4 % EX SOLN: 4.0000 | Freq: Once | CUTANEOUS | Status: DC

## 2023-11-25 MED ORDER — FENTANYL CITRATE (PF) 100 MCG/2ML IJ SOLN
INTRAMUSCULAR | Status: AC
  Filled 2023-11-25: qty 2

## 2023-11-25 MED ORDER — ONDANSETRON HCL 4 MG/2ML IJ SOLN: 4.0000 mg | Freq: Four times a day (QID) | INTRAMUSCULAR | Status: DC | PRN

## 2023-11-25 MED ORDER — FENTANYL CITRATE (PF) 100 MCG/2ML IJ SOLN
INTRAMUSCULAR | Status: DC | PRN
  Administered 2023-11-25 (×2): 25 ug via INTRAVENOUS

## 2023-11-25 MED ORDER — SODIUM CHLORIDE 0.9 % IV SOLN
INTRAVENOUS | Status: DC | PRN
  Administered 2023-11-25: 1000 mL via INTRAVENOUS

## 2023-11-25 MED ORDER — SODIUM CHLORIDE 0.9 % IV SOLN
80.0000 mg | INTRAVENOUS | Status: AC
  Administered 2023-11-25: 80 mg

## 2023-11-25 MED ORDER — SODIUM CHLORIDE 0.9 % IV SOLN
INTRAVENOUS | Status: AC
  Filled 2023-11-25: qty 2

## 2023-11-25 MED ORDER — MIDAZOLAM HCL 2 MG/2ML IJ SOLN
INTRAMUSCULAR | Status: AC
  Filled 2023-11-25: qty 2

## 2023-11-25 MED ORDER — MIDAZOLAM HCL 5 MG/5ML IJ SOLN
INTRAMUSCULAR | Status: DC | PRN
  Administered 2023-11-25 (×2): 1 mg via INTRAVENOUS

## 2023-11-25 MED ORDER — LIDOCAINE HCL (PF) 1 % IJ SOLN
INTRAMUSCULAR | Status: AC
  Filled 2023-11-25: qty 60

## 2023-11-25 MED ORDER — CEFAZOLIN SODIUM-DEXTROSE 2-4 GM/100ML-% IV SOLN
2.0000 g | INTRAVENOUS | Status: AC
  Administered 2023-11-25: 2 g via INTRAVENOUS

## 2023-11-25 MED ORDER — CEFAZOLIN SODIUM-DEXTROSE 2-4 GM/100ML-% IV SOLN
INTRAVENOUS | Status: AC
  Filled 2023-11-25: qty 100

## 2023-11-25 MED ORDER — ACETAMINOPHEN 325 MG PO TABS
325.0000 mg | ORAL_TABLET | ORAL | Status: DC | PRN
  Administered 2023-11-25: 650 mg via ORAL
  Filled 2023-11-25: qty 2

## 2023-11-25 MED ORDER — SODIUM CHLORIDE 0.9 % IR SOLN: Status: DC | PRN

## 2023-11-25 MED ORDER — DIAZEPAM 5 MG PO TABS
5.0000 mg | ORAL_TABLET | Freq: Once | ORAL | Status: AC
Start: 2023-11-25 — End: 2023-11-25

## 2023-11-25 MED ORDER — SODIUM CHLORIDE 0.9 % IV SOLN: INTRAVENOUS | Status: DC

## 2023-11-25 MED ORDER — LIDOCAINE HCL (PF) 1 % IJ SOLN
INTRAMUSCULAR | Status: AC
  Filled 2023-11-25: qty 30

## 2023-11-25 MED ORDER — DIAZEPAM 5 MG PO TABS
ORAL_TABLET | ORAL | Status: AC
  Administered 2023-11-25: 5 mg via ORAL
  Filled 2023-11-25: qty 1

## 2023-11-25 MED ORDER — POVIDONE-IODINE 10 % EX SWAB: 2.0000 | Freq: Once | CUTANEOUS | Status: DC

## 2023-11-25 MED ORDER — LIDOCAINE HCL (PF) 1 % IJ SOLN
INTRAMUSCULAR | Status: DC | PRN
  Administered 2023-11-25: 40 mL

## 2023-11-25 SURGICAL SUPPLY — 7 items
CABLE SURGICAL S-101-97-12 (CABLE) ×2 IMPLANT
ICD COBALT XT VR DVPA2D4 (ICD Generator) IMPLANT
KIT INSTRUMENT PACEMAKER INSER (INSTRUMENTS) IMPLANT
LEAD SPRINT QUAT SEC 6935M-62 (Lead) IMPLANT
PAD DEFIB RADIO PHYSIO CONN (PAD) ×2 IMPLANT
SHEATH 9FR PRELUDE SNAP 13 (SHEATH) IMPLANT
TRAY PACEMAKER INSERTION (PACKS) ×2 IMPLANT

## 2023-11-25 NOTE — Interval H&P Note (Signed)
 History and Physical Interval Note:  11/25/2023 2:52 PM  [name]  has presented today for surgery, with the diagnosis of CARDIOMYO.  The various methods of treatment have been discussed with the patient and family. After consideration of risks, benefits and other options for treatment, the patient has consented to  Procedure(s): ICD IMPLANT (N/A) as a surgical intervention.  The patient's history has been reviewed, patient examined, no change in status, stable for surgery.  I have reviewed the patient's chart and labs.  Questions were answered to the patient's satisfaction.     Jaycob Mcclenton [employer]

## 2023-11-25 NOTE — Discharge Instructions (Addendum)
 After Your ICD (Implantable Cardiac Defibrillator)   You have a Medtronic ICD  If you have a Medtronic or Biotronik device, plug in your home monitor once you get home, and no manual interaction is required.   If you have an [employer] or [employer] device, plug your home monitor once you get home, sit near the device, and press the large activation button. Sit nearby until the process is complete, usually notated by lights on the monitor.   If you were set up for monitoring using an app on your phone, make sure the app remains open in the background and the Bluetooth remains on.  ACTIVITY Do not lift your arm above shoulder height for 1 week after your procedure. After 7 days, you may progress as below.  You should remove your sling 24 hours after your procedure, unless otherwise instructed by your provider.     Tuesday December 02, 2023  Wednesday December 03, 2023 Thursday December 04, 2023 Friday December 05, 2023   Do not lift, push, pull, or carry anything over 10 pounds with the affected arm until 6 weeks (Tuesday January 06, 2024 ) after your procedure.   You may drive AFTER your wound check, unless you have been told otherwise by your provider.   Ask your healthcare provider when you can go back to work   INCISION/Dressing If you are on a blood thinner such as Coumadin, Xarelto, Eliquis, Plavix, or Pradaxa please confirm with your provider when this should be resumed.   If large square, outer bandage is left in place, this can be removed after 24 hours from your procedure. Do not remove steri-strips or glue as below.   Monitor your defibrillator site for redness, swelling, and drainage. Call the device clinic at [phone] if you experience these symptoms or fever/chills.  If your incision is sealed with Steri-strips or staples, you may shower 7 days after your procedure or when told by your provider. Do not remove the steri-strips or let the shower hit directly on your site.  You may wash around your site with soap and water.    If you were discharged in a sling, please do not wear this during the day more than 48 hours after your surgery unless otherwise instructed. This may increase the risk of stiffness and soreness in your shoulder.   Avoid lotions, ointments, or perfumes over your incision until it is well-healed.  You may use a hot tub or a pool AFTER your wound check appointment if the incision is completely closed.  Your ICD is designed to protect you from life threatening heart rhythms. Because of this, you may receive a shock.   1 shock with no symptoms:  Call the office during business hours. 1 shock with symptoms (chest pain, chest pressure, dizziness, lightheadedness, shortness of breath, overall feeling unwell):  Call 911. If you experience 2 or more shocks in 24 hours:  Call 911. If you receive a shock, you should not drive for 6 months per the [place] DMV IF you receive appropriate therapy from your ICD.   ICD Alerts:  Some alerts are vibratory and others beep. These are NOT emergencies. Please call our office to let us  know. If this occurs at night or on weekends, it can wait until the next business day. Send a remote transmission.  If your device is capable of reading fluid status (for heart failure), you will be offered monthly monitoring to review this with you.   DEVICE MANAGEMENT Remote monitoring  is used to monitor your ICD from home. This monitoring is scheduled every 91 days by our office. It allows us  to keep an eye on the functioning of your device to ensure it is working properly. You will routinely see your Electrophysiologist annually (more often if necessary). This will appear as a REMOTE check on your MyChart schedule. These are automatic and there is nothing for you to manually do unless otherwise instructed.  You should receive your ID card for your new device in 4-8 weeks. Keep this card with you at all times once received. Consider  wearing a medical alert bracelet or necklace.  Your ICD  may be MRI compatible. This will be discussed at your next office visit/wound check.  You should avoid contact with strong electric or magnetic fields.   Do not use amateur (ham) radio equipment or electric (arc) welding torches. MP3 player headphones with magnets should not be used. Some devices are safe to use if held at least 12 inches (30 cm) from your defibrillator. These include power tools, lawn mowers, and speakers. If you are unsure if something is safe to use, ask your health care provider.  When using your cell phone, hold it to the ear that is on the opposite side from the defibrillator. Do not leave your cell phone in a pocket over the defibrillator.  You may safely use electric blankets, heating pads, computers, and microwave ovens.  Call the office right away if: You have chest pain. You feel more than one shock. You feel more short of breath than you have felt before. You feel more light-headed than you have felt before. Your incision starts to open up.  This information is not intended to replace advice given to you by your health care provider. Make sure you discuss any questions you have with your health care provider.

## 2023-11-26 ENCOUNTER — Telehealth: Payer: Self-pay | Admitting: Cardiology

## 2023-11-26 DIAGNOSIS — T8149XA Infection following a procedure, other surgical site, initial encounter: Secondary | ICD-10-CM | POA: Diagnosis not present

## 2023-11-26 DIAGNOSIS — B9689 Other specified bacterial agents as the cause of diseases classified elsewhere: Secondary | ICD-10-CM | POA: Diagnosis not present

## 2023-11-26 NOTE — Telephone Encounter (Signed)
 Pts husband calling about his wifes ICD implant performed 11/25/23. He states pt is having some severe pain, and wondering if there is anything we can prescribe her for this. Requesting call back, please advise .

## 2023-11-26 NOTE — Telephone Encounter (Signed)
 Spoke to patient to clarify patient removed the large transparent dressing with 4x4 in the middle. Patient does report of blister where clear dressing was applied. Advised patient to Keep area clean and dry. Pt voiced understanding and appreciative of call back.

## 2023-11-26 NOTE — Telephone Encounter (Signed)
 Pt reports severe pain at her implant site.  Says she has taken Tylenol  extra strength but it has not helped at all.  Asked about the Morphine  PRN on her MAR but pt denies that she is still taking it, has not taken it in 8 months  and also says that medication does not work for me.  Aware will forward to MD for advisement.  After review, pt made aware to take Ibuprofen  600 mg TID x 3 days. Pt agreeable to plan

## 2023-11-27 ENCOUNTER — Ambulatory Visit (HOSPITAL_COMMUNITY): Admitting: Mental Health

## 2023-11-27 DIAGNOSIS — F332 Major depressive disorder, recurrent severe without psychotic features: Secondary | ICD-10-CM

## 2023-11-27 DIAGNOSIS — F419 Anxiety disorder, unspecified: Secondary | ICD-10-CM

## 2023-11-27 NOTE — Progress Notes (Signed)
   THERAPIST PROGRESS NOTE  Session Time: 2:00pm   Participation Level: Active  Behavioral Response: CasualAlertDepressed and Dysphoric  Type of Therapy: Individual Therapy  Treatment Goals addressed: STG: To cope. [name] will increase management of moods AEB development of x3 effective coping skills with ability to process thoughts and feelings in balanced manner within the next 90 days.   ProgressTowards Goals: Progressing  Interventions: CBT and Supportive  Summary: [name] is a 58 y.o. female who presents with dx of major depression recurrent severe.  Presents for session alert and oriented; mood and affect adequate; slightly irritable. In noticeable pain; slow gait with use of wheel chair. Presents wearing a hospital gown and shares to have recently had in defibrillator implanted. Shares thoughts of regret with increase in pain in that area with already experiencing previous pain from previous medical issues. Notes thoughts and concerns being irritable and angry with husband and thoughts of behaving like her father. Shares with therapist hx of relationship with mother and father and feeling as if her emotions, needs have not been valid.: I am not valued.   I was the good child. My sister was not. Shares things in which her sister received. Shares thoughts on father currently being sick and thoughts of wanting to set boundaries with his treatment of her however thoughts on would not forgive her self if father passed and she did not speak with him. Notes thoughts of building frustrations and for this to then be placed on husband. Notes concerns for level of sensitive and concern for her to hold Asperger's,  I feel so overwhelmed Denies safety concerns. Ongoing work towards goals.  Suicidal/Homicidal: Nowithout intent/plan  Therapist Response: Therapist engaged [name] in therapy session. Assessed for current level of functioning, sxs management and current stressors. Provided safe  space to share thoughts and feelings in regards to current stressors. Active empathic listening; Provided support and encouragement; validated feelings. Supported Insurance risk surveyor in processing childhood, relationships with parents and sister and thought of not being valued by family. Reviewed session and provided follow up.  Plan: Return again in  x 5 weeks.  Diagnosis: Major depressive disorder, recurrent episode, severe with anxious distress (HCC)  Anxiety  Collaboration of Care: Other None  Patient/Guardian was advised Release of Information must be obtained prior to any record release in order to collaborate their care with an outside provider. Patient/Guardian was advised if they have not already done so to contact the registration department to sign all necessary forms in order for us  to release information regarding their care.   Consent: Patient/Guardian gives verbal consent for treatment and assignment of benefits for services provided during this visit. Patient/Guardian expressed understanding and agreed to proceed.   Ty Asal [place], [facility] 11/27/2023

## 2023-11-29 DIAGNOSIS — E1159 Type 2 diabetes mellitus with other circulatory complications: Secondary | ICD-10-CM | POA: Diagnosis not present

## 2023-11-29 DIAGNOSIS — G8929 Other chronic pain: Secondary | ICD-10-CM | POA: Diagnosis not present

## 2023-11-29 DIAGNOSIS — E1169 Type 2 diabetes mellitus with other specified complication: Secondary | ICD-10-CM | POA: Diagnosis not present

## 2023-11-29 DIAGNOSIS — M47816 Spondylosis without myelopathy or radiculopathy, lumbar region: Secondary | ICD-10-CM | POA: Diagnosis not present

## 2023-11-29 DIAGNOSIS — E785 Hyperlipidemia, unspecified: Secondary | ICD-10-CM | POA: Diagnosis not present

## 2023-11-29 DIAGNOSIS — I428 Other cardiomyopathies: Secondary | ICD-10-CM | POA: Diagnosis not present

## 2023-11-29 DIAGNOSIS — M170 Bilateral primary osteoarthritis of knee: Secondary | ICD-10-CM | POA: Diagnosis not present

## 2023-11-29 DIAGNOSIS — Z9181 History of falling: Secondary | ICD-10-CM | POA: Diagnosis not present

## 2023-11-29 DIAGNOSIS — F333 Major depressive disorder, recurrent, severe with psychotic symptoms: Secondary | ICD-10-CM | POA: Diagnosis not present

## 2023-11-29 DIAGNOSIS — I5042 Chronic combined systolic (congestive) and diastolic (congestive) heart failure: Secondary | ICD-10-CM | POA: Diagnosis not present

## 2023-11-29 DIAGNOSIS — G4733 Obstructive sleep apnea (adult) (pediatric): Secondary | ICD-10-CM | POA: Diagnosis not present

## 2023-11-29 DIAGNOSIS — Z7984 Long term (current) use of oral hypoglycemic drugs: Secondary | ICD-10-CM | POA: Diagnosis not present

## 2023-11-29 DIAGNOSIS — F988 Other specified behavioral and emotional disorders with onset usually occurring in childhood and adolescence: Secondary | ICD-10-CM | POA: Diagnosis not present

## 2023-11-29 DIAGNOSIS — I252 Old myocardial infarction: Secondary | ICD-10-CM | POA: Diagnosis not present

## 2023-11-29 DIAGNOSIS — T8149XD Infection following a procedure, other surgical site, subsequent encounter: Secondary | ICD-10-CM | POA: Diagnosis not present

## 2023-11-29 DIAGNOSIS — I080 Rheumatic disorders of both mitral and aortic valves: Secondary | ICD-10-CM | POA: Diagnosis not present

## 2023-11-29 DIAGNOSIS — G5601 Carpal tunnel syndrome, right upper limb: Secondary | ICD-10-CM | POA: Diagnosis not present

## 2023-11-29 DIAGNOSIS — M4800 Spinal stenosis, site unspecified: Secondary | ICD-10-CM | POA: Diagnosis not present

## 2023-11-29 DIAGNOSIS — Z6841 Body Mass Index (BMI) 40.0 and over, adult: Secondary | ICD-10-CM | POA: Diagnosis not present

## 2023-11-29 DIAGNOSIS — Z87891 Personal history of nicotine dependence: Secondary | ICD-10-CM | POA: Diagnosis not present

## 2023-11-29 DIAGNOSIS — I152 Hypertension secondary to endocrine disorders: Secondary | ICD-10-CM | POA: Diagnosis not present

## 2023-11-29 DIAGNOSIS — G43909 Migraine, unspecified, not intractable, without status migrainosus: Secondary | ICD-10-CM | POA: Diagnosis not present

## 2023-11-29 DIAGNOSIS — D539 Nutritional anemia, unspecified: Secondary | ICD-10-CM | POA: Diagnosis not present

## 2023-11-29 DIAGNOSIS — I4901 Ventricular fibrillation: Secondary | ICD-10-CM | POA: Diagnosis not present

## 2023-11-29 DIAGNOSIS — E669 Obesity, unspecified: Secondary | ICD-10-CM | POA: Diagnosis not present

## 2023-12-02 DIAGNOSIS — F333 Major depressive disorder, recurrent, severe with psychotic symptoms: Secondary | ICD-10-CM | POA: Diagnosis not present

## 2023-12-02 DIAGNOSIS — I152 Hypertension secondary to endocrine disorders: Secondary | ICD-10-CM | POA: Diagnosis not present

## 2023-12-02 DIAGNOSIS — I5042 Chronic combined systolic (congestive) and diastolic (congestive) heart failure: Secondary | ICD-10-CM | POA: Diagnosis not present

## 2023-12-02 DIAGNOSIS — E1159 Type 2 diabetes mellitus with other circulatory complications: Secondary | ICD-10-CM | POA: Diagnosis not present

## 2023-12-02 DIAGNOSIS — T8149XD Infection following a procedure, other surgical site, subsequent encounter: Secondary | ICD-10-CM | POA: Diagnosis not present

## 2023-12-02 DIAGNOSIS — I4901 Ventricular fibrillation: Secondary | ICD-10-CM | POA: Diagnosis not present

## 2023-12-08 ENCOUNTER — Encounter (HOSPITAL_BASED_OUTPATIENT_CLINIC_OR_DEPARTMENT_OTHER): Attending: Internal Medicine | Admitting: Internal Medicine

## 2023-12-08 DIAGNOSIS — T8140XA Infection following a procedure, unspecified, initial encounter: Secondary | ICD-10-CM | POA: Insufficient documentation

## 2023-12-08 DIAGNOSIS — X58XXXA Exposure to other specified factors, initial encounter: Secondary | ICD-10-CM | POA: Insufficient documentation

## 2023-12-08 DIAGNOSIS — Z8674 Personal history of sudden cardiac arrest: Secondary | ICD-10-CM | POA: Diagnosis not present

## 2023-12-08 DIAGNOSIS — L97828 Non-pressure chronic ulcer of other part of left lower leg with other specified severity: Secondary | ICD-10-CM | POA: Insufficient documentation

## 2023-12-08 DIAGNOSIS — T798XXA Other early complications of trauma, initial encounter: Secondary | ICD-10-CM | POA: Insufficient documentation

## 2023-12-09 ENCOUNTER — Ambulatory Visit: Attending: Cardiology

## 2023-12-09 DIAGNOSIS — E1159 Type 2 diabetes mellitus with other circulatory complications: Secondary | ICD-10-CM | POA: Diagnosis not present

## 2023-12-09 DIAGNOSIS — I4901 Ventricular fibrillation: Secondary | ICD-10-CM | POA: Insufficient documentation

## 2023-12-09 DIAGNOSIS — T8149XD Infection following a procedure, other surgical site, subsequent encounter: Secondary | ICD-10-CM | POA: Diagnosis not present

## 2023-12-09 DIAGNOSIS — I5042 Chronic combined systolic (congestive) and diastolic (congestive) heart failure: Secondary | ICD-10-CM | POA: Diagnosis not present

## 2023-12-09 DIAGNOSIS — F333 Major depressive disorder, recurrent, severe with psychotic symptoms: Secondary | ICD-10-CM | POA: Diagnosis not present

## 2023-12-09 DIAGNOSIS — I152 Hypertension secondary to endocrine disorders: Secondary | ICD-10-CM | POA: Diagnosis not present

## 2023-12-09 NOTE — Progress Notes (Signed)
 Normal single chamber ICD wound check. Wound well healed. Presenting rhythm: VS 100. Routine testing performed. Thresholds, sensing, and impedance consistent with implant measurements with 3.5V safety margin/auto capture until 3 month visit. No treated arrhythmias. Reviewed arm restrictions to continue for 6 weeks total post op. Reviewed shock plan.  Pt enrolled in remote follow-up.

## 2023-12-09 NOTE — Patient Instructions (Signed)
   After Your ICD (Implantable Cardiac Defibrillator)    Monitor your defibrillator site for redness, swelling, and drainage. Call the device clinic at [phone] if you experience these symptoms or fever/chills.  Your incision was closed with Steri-strips or staples:  You may shower 7 days after your procedure and wash your incision with soap and water. Avoid lotions, ointments, or perfumes over your incision until it is well-healed.  You may use a hot tub or a pool after your wound check appointment if the incision is completely closed.  Do not lift, push or pull greater than 10 pounds with the affected arm until 6 weeks after your procedure. There are no other restrictions in arm movement after your wound check appointment.  Your ICD is designed to protect you from life threatening heart rhythms. Because of this, you may receive a shock.   1 shock with no symptoms:  Call the office during business hours. 1 shock with symptoms (chest pain, chest pressure, dizziness, lightheadedness, shortness of breath, overall feeling unwell):  Call 911. If you experience 2 or more shocks in 24 hours:  Call 911. If you receive a shock, you should not drive.  [place] DMV - no driving for 6 months if you receive appropriate therapy from your ICD.   ICD Alerts:  Some alerts are vibratory and others beep. These are NOT emergencies. Please call our office to let us  know. If this occurs at night or on weekends, it can wait until the next business day. Send a remote transmission.  If your device is capable of reading fluid status (for heart failure), you will be offered monthly monitoring to review this with you.   Remote monitoring is used to monitor your ICD from home. This monitoring is scheduled every 91 days by our office. It allows us  to keep an eye on the functioning of your device to ensure it is working properly. You will routinely see your Electrophysiologist annually (more often if necessary).

## 2023-12-10 LAB — CUP PACEART INCLINIC DEVICE CHECK
Date Time Interrogation Session: 20251014165815
Implantable Lead Connection Status: 753985
Implantable Lead Implant Date: 20250930
Implantable Lead Location: 753860
Implantable Pulse Generator Implant Date: 20250930

## 2023-12-11 ENCOUNTER — Encounter: Payer: Self-pay | Admitting: Psychology

## 2023-12-11 ENCOUNTER — Encounter: Attending: Psychology | Admitting: Psychology

## 2023-12-11 DIAGNOSIS — F332 Major depressive disorder, recurrent severe without psychotic features: Secondary | ICD-10-CM | POA: Diagnosis not present

## 2023-12-11 DIAGNOSIS — I5042 Chronic combined systolic (congestive) and diastolic (congestive) heart failure: Secondary | ICD-10-CM | POA: Insufficient documentation

## 2023-12-11 DIAGNOSIS — F09 Unspecified mental disorder due to known physiological condition: Secondary | ICD-10-CM | POA: Insufficient documentation
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
## 2023-12-15 ENCOUNTER — Ambulatory Visit: Payer: Self-pay | Admitting: Cardiology

## 2023-12-17 DIAGNOSIS — T8149XD Infection following a procedure, other surgical site, subsequent encounter: Secondary | ICD-10-CM | POA: Diagnosis not present

## 2023-12-17 DIAGNOSIS — I152 Hypertension secondary to endocrine disorders: Secondary | ICD-10-CM | POA: Diagnosis not present

## 2023-12-17 DIAGNOSIS — E1159 Type 2 diabetes mellitus with other circulatory complications: Secondary | ICD-10-CM | POA: Diagnosis not present

## 2023-12-17 DIAGNOSIS — I4901 Ventricular fibrillation: Secondary | ICD-10-CM | POA: Diagnosis not present

## 2023-12-17 DIAGNOSIS — F333 Major depressive disorder, recurrent, severe with psychotic symptoms: Secondary | ICD-10-CM | POA: Diagnosis not present

## 2023-12-17 DIAGNOSIS — I5042 Chronic combined systolic (congestive) and diastolic (congestive) heart failure: Secondary | ICD-10-CM | POA: Diagnosis not present

## 2023-12-18 ENCOUNTER — Ambulatory Visit: Payer: Self-pay

## 2023-12-18 NOTE — Telephone Encounter (Signed)
 FYI Only or Action Required?: Action required by provider: clinical question for provider.  Patient was last seen in primary care on 11/06/2023 by Plotnikov, Karlynn GAILS, MD.  Called Nurse Triage reporting Vaginitis.  Symptoms began x couple weeks and wosening.  Interventions attempted: Nothing.  Symptoms are: gradually worsening.  Triage Disposition: See Physician Within 24 Hours  Patient/caregiver understands and will follow disposition?: Yes    Copied from CRM [id number]. Topic: Clinical - Red Word Triage >> Dec 18, 2023 10:20 AM Pinkey ORN wrote: Red Word that prompted transfer to Nurse Triage: Worsening Yeast Infection Reason for Disposition  Health information question, no triage required and triager able to answer question  MODERATE-SEVERE itching (i.e., interferes with school, work, or sleep)  Answer Assessment - Initial Assessment Questions 1. SYMPTOM: What's the main symptom you're concerned about? (e.g., pain, itching, dryness)     Burning, itching, blood on tissue from rubbing/itching, vaginal discharge 2. LOCATION: Where is the   located? (e.g., inside/outside, left/right)     vaginal 3. ONSET: When did the    start?     X couple of weeks 4. PAIN: Is there any pain? If Yes, ask: How bad is it? (Scale: 1-10; mild, moderate, severe)     burning 5. ITCHING: Is there any itching? If Yes, ask: How bad is it? (Scale: 1-10; mild, moderate, severe)     moderate 6. CAUSE: What do you think is causing the discharge? Have you had the same problem before? What happened then?     Long term ABX 7. OTHER SYMPTOMS: Do you have any other symptoms? (e.g., fever, itching, vaginal bleeding, pain with urination, injury to genital area, vaginal foreign body)     Vaginal bleeding, pain/burning with urination 8. PREGNANCY: Is there any chance you are pregnant? When was your last menstrual period?     Na  Answer Assessment - Initial Assessment Questions 1.  REASON FOR CALL: What is the main reason for your call? or How can I best help you?  Pt is in need of a letter stating pt husband [name] Vezina needs to take of patient on a regular basis.  Husband is the full time caregiver for patent.  This letter is needed for Food Stamps and letter is needed asap but has to be in their office no later than 1st of November.  Please call as soon letter is completed.  Pt/pt's husband can pick letter as soon as its ready.      Pt states also would like to talk with you able right knee buckling/painful, neuropathy bilateral feet & nerve pain.  Pt would like appt with PCP to discuss these items.  Please call appt for possibility of being worked into schedule.  Protocols used: Vaginal Symptoms-A-AH, Information Only Call - No Triage-A-AH

## 2023-12-19 ENCOUNTER — Encounter: Payer: Self-pay | Admitting: Internal Medicine

## 2023-12-19 ENCOUNTER — Ambulatory Visit: Admitting: Internal Medicine

## 2023-12-19 VITALS — BP 130/84 | HR 84 | Temp 98.4°F | Ht 64.0 in | Wt 233.8 lb

## 2023-12-19 DIAGNOSIS — M545 Low back pain, unspecified: Secondary | ICD-10-CM | POA: Diagnosis not present

## 2023-12-19 DIAGNOSIS — B3731 Acute candidiasis of vulva and vagina: Secondary | ICD-10-CM | POA: Diagnosis not present

## 2023-12-19 DIAGNOSIS — M25561 Pain in right knee: Secondary | ICD-10-CM

## 2023-12-19 DIAGNOSIS — G8929 Other chronic pain: Secondary | ICD-10-CM

## 2023-12-19 DIAGNOSIS — M25569 Pain in unspecified knee: Secondary | ICD-10-CM | POA: Insufficient documentation

## 2023-12-19 MED ORDER — B COMPLEX-C PO TABS: 1.0000 | ORAL_TABLET | Freq: Every day | ORAL | Status: AC

## 2023-12-19 MED ORDER — FLUCONAZOLE 100 MG PO TABS: ORAL_TABLET | ORAL | 1 refills | Status: AC

## 2023-12-19 MED ORDER — VITAMIN D3 50 MCG (2000 UT) PO CAPS: 2000.0000 [IU] | ORAL_CAPSULE | Freq: Every day | ORAL | Status: AC

## 2023-12-19 MED ORDER — CLOTRIMAZOLE-BETAMETHASONE 1-0.05 % EX CREA: 1.0000 | TOPICAL_CREAM | Freq: Two times a day (BID) | CUTANEOUS | 0 refills | Status: AC

## 2023-12-19 NOTE — Patient Instructions (Addendum)
 USEFUL THINGS FOR ARTHRITIS and musculoskeletal pains:    A rice sock heating pad refers to a homemade heating pad created by filling a sock with uncooked rice, which can be heated in a microwave to provide a warm compress for sore muscles, pain relief, or other applications; essentially, it's a simple way to generate heat using readily available materials.  Key points about rice sock heat: How to make it: Fill a clean sock (preferably a tube sock) about 2/3 full with uncooked rice, tie a knot at the top to secure the rice inside.  Heating it up: Place the rice sock in the microwave and heat in short intervals (usually around 30 seconds at a time) until it reaches the desired warmth.  Important considerations: Check temperature before applying: Always test the temperature of the rice sock before applying it to your skin to avoid burns.  Use a towel to protect skin: Wrap the rice sock in a thin towel to distribute the heat evenly and protect your skin.  Uses: Muscle aches and pains  Menstrual cramps  Neck pain  Arthritis discomfort     BLUE EMU CREAM: Use it 2-3 times a day on painful areas    Epsom salt soaks for feet Vitamin B complex with C Vit D3 2000 international units a day

## 2023-12-19 NOTE — Progress Notes (Signed)
 Subjective:  Patient ID: [name], female    DOB: 11/07/1965  Age: 58 y.o. MRN: [id number]  CC: Vaginitis (Yeast infection. Patient was on long term antibiotics until last month. Constipation. Chronic right knee pain)   HPI [name] presents to the office with her husband.  They have multiple complaints.  Complaining of yeast vaginitis C/o L knee wound -chronic and severe C/o R knee pain without swelling.  Worse with walking/weightbearing C/o B feet neuropathy  Outpatient Medications Prior to Visit  Medication Sig Dispense Refill   Accu-Chek Softclix Lancets lancets Use as instructed 100 each 12   AMBULATORY NON FORMULARY MEDICATION Off-loading wheel chair seat cusion Dispense 1 Dx code: M53.3 Use as needed 1 Units 0   atorvastatin  (LIPITOR ) 80 MG tablet TAKE 1 TABLET BY MOUTH EVERY DAY 90 tablet 0   Blood Glucose Monitoring Suppl DEVI 1 each by Does not apply route in the morning, at noon, and at bedtime. May substitute to any manufacturer covered by patient's insurance. 1 each 0   collagenase  (SANTYL ) 250 UNIT/GM ointment Apply 1 Application topically daily.     dapagliflozin  propanediol (FARXIGA ) 10 MG TABS tablet TAKE 1 TABLET BY MOUTH EVERY DAY 90 tablet 3   furosemide  (LASIX ) 40 MG tablet Take 1 tablet (40 mg total) by mouth daily. 90 tablet 3   gentamicin ointment (GARAMYCIN) 0.1 % Apply 1 Application topically at bedtime.     Glucose Blood (BLOOD GLUCOSE TEST STRIPS) STRP 1 each by In Vitro route in the morning, at noon, and at bedtime. May substitute to any manufacturer covered by patient's insurance. 100 strip 11   hydrOXYzine  (ATARAX ) 25 MG tablet TAKE 1 TABLET BY MOUTH THREE TIMES A DAY AS NEEDED 270 tablet 0   losartan  (COZAAR ) 25 MG tablet Take 0.5 tablets (12.5 mg total) by mouth daily.     magnesium  oxide (MAG-OX) 400 MG tablet Take 1 tablet (400 mg total) by mouth at bedtime. 90 tablet 1   meclizine  (ANTIVERT ) 12.5 MG tablet Take 1 tablet (12.5 mg total) by  mouth 3 (three) times daily as needed. 40 tablet 1   metFORMIN  (GLUCOPHAGE -XR) 500 MG 24 hr tablet TAKE 1 TABLET BY MOUTH 2 TIMES DAILY WITH A MEAL. 180 tablet 1   metoprolol  succinate (TOPROL -XL) 50 MG 24 hr tablet TAKE 1 TABLET BY MOUTH DAILY. TAKE WITH OR IMMEDIATELY FOLLOWING A MEAL. 90 tablet 3   NON FORMULARY Pt uses cpap nightly     polyethylene glycol powder (GLYCOLAX /MIRALAX ) 17 GM/SCOOP powder Take 1 capful (17 g) in water 2 (two) times daily. 765 g 0   potassium chloride  SA (KLOR-CON  M) 20 MEQ tablet TAKE 1 TABLET BY MOUTH EVERY DAY 90 tablet 3   spironolactone  (ALDACTONE ) 25 MG tablet TAKE 1/2 TABLET BY MOUTH EVERY DAY 45 tablet 2   morphine  (MS CONTIN ) 15 MG 12 hr tablet Take 1 tablet (15 mg total) by mouth every 12 (twelve) hours. (Patient taking differently: Take 15 mg by mouth every 12 (twelve) hours as needed for pain.) 60 tablet 0   No facility-administered medications prior to visit.    ROS: Review of Systems  Constitutional:  Positive for fatigue and unexpected weight change. Negative for activity change, appetite change and chills.  HENT:  Negative for congestion, mouth sores and sinus pressure.   Eyes:  Negative for visual disturbance.  Respiratory:  Positive for shortness of breath. Negative for cough and chest tightness.   Gastrointestinal:  Negative for abdominal pain  and nausea.  Genitourinary:  Positive for vaginal discharge and vaginal pain. Negative for difficulty urinating, frequency, urgency and vaginal bleeding.  Musculoskeletal:  Positive for arthralgias, back pain and neck stiffness. Negative for gait problem.  Skin:  Negative for pallor and rash.  Neurological:  Positive for dizziness, weakness and light-headedness. Negative for tremors, syncope, numbness and headaches.  Psychiatric/Behavioral:  Positive for decreased concentration, dysphoric mood and sleep disturbance. Negative for confusion and suicidal ideas.     Objective:  BP 130/84   Pulse 84    Temp 98.4 F (36.9 C)   Ht 5' 4 (1.626 m)   Wt 233 lb 12.8 oz (106.1 kg)   LMP  (LMP Unknown)   SpO2 98%   BMI 40.13 kg/m   BP Readings from Last 3 Encounters:  12/19/23 130/84  11/25/23 131/79  11/20/23 132/85    Wt Readings from Last 3 Encounters:  12/19/23 233 lb 12.8 oz (106.1 kg)  11/25/23 228 lb (103.4 kg)  11/20/23 226 lb (102.5 kg)    Physical Exam Constitutional:      General: She is not in acute distress.    Appearance: She is well-developed. She is obese.  HENT:     Head: Normocephalic.     Right Ear: External ear normal.     Left Ear: External ear normal.     Nose: Nose normal.  Eyes:     General:        Right eye: No discharge.        Left eye: No discharge.     Conjunctiva/sclera: Conjunctivae normal.     Pupils: Pupils are equal, round, and reactive to light.  Neck:     Thyroid : No thyromegaly.     Vascular: No JVD.     Trachea: No tracheal deviation.  Cardiovascular:     Rate and Rhythm: Normal rate and regular rhythm.     Heart sounds: Normal heart sounds.  Pulmonary:     Effort: No respiratory distress.     Breath sounds: No stridor. No wheezing.  Abdominal:     General: Bowel sounds are normal. There is no distension.     Palpations: Abdomen is soft. There is no mass.     Tenderness: There is no abdominal tenderness. There is no guarding or rebound.  Musculoskeletal:        General: Tenderness present.     Cervical back: Normal range of motion and neck supple. No rigidity.     Right lower leg: No edema.     Left lower leg: No edema.  Lymphadenopathy:     Cervical: No cervical adenopathy.  Skin:    Findings: No erythema or rash.  Neurological:     Cranial Nerves: No cranial nerve deficit.     Motor: No abnormal muscle tone.     Coordination: Coordination normal.     Deep Tendon Reflexes: Reflexes normal.  Psychiatric:        Behavior: Behavior normal.        Thought Content: Thought content normal.        Judgment: Judgment normal.     The patient is overweight.  Arthritic gait.  Using a cane.  Left knee wound is dressed.  Right knee is not swollen slightly sensitive to palpation and with range of motion  Lab Results  Component Value Date   WBC 10.8 10/30/2023   HGB 15.7 10/30/2023   HCT 48.6 (H) 10/30/2023   PLT 322 10/30/2023   GLUCOSE 128 (H)  11/06/2023   CHOL 194 08/13/2022   TRIG 208 (H) 12/06/2022   HDL 48.90 08/13/2022   LDLDIRECT 143.8 04/03/2011   LDLCALC 117 (H) 08/13/2022   ALT 28 11/06/2023   AST 34 11/06/2023   NA 138 11/06/2023   K 4.1 11/06/2023   CL 100 11/06/2023   CREATININE 1.02 11/06/2023   BUN 28 (H) 11/06/2023   CO2 28 11/06/2023   TSH 4.29 01/15/2023   INR 1.2 02/25/2022   HGBA1C 6.3 11/06/2023   MICROALBUR 1.0 05/22/2023    DG Chest 2 View Result Date: 11/25/2023 CLINICAL DATA:  Status post ICD implant. EXAM: CHEST - 2 VIEW COMPARISON:  May 19, 2023 FINDINGS: The lateral views limited in evaluation secondary to positioning of the patient's upper extremity. A properly positioned single lead ventricular pacer is noted. The heart size and mediastinal contours are within normal limits. Both lungs are clear. There is mild to moderate severity dextroscoliosis of the mid to lower thoracic spine. IMPRESSION: No active cardiopulmonary disease. Electronically Signed   By: Suzen Dials M.D.   On: 11/25/2023 19:34   EP PPM/ICD IMPLANT Result Date: 11/25/2023 SURGEON:  Soyla Norton, MD    PREPROCEDURE DIAGNOSES:  1. Nonischemic cardiomyopathy.  2. [place]  Heart Association class II, heart failure chronically.  3. Cardiac arrest  POSTPROCEDURE DIAGNOSES:  1. Nonischemic cardiomyopathy.  2. [place]  Heart Association class II heart failure chronically.  3. Cardiac arrest  PROCEDURES:   1. ICD implantation.  INTRODUCTION:  JNIYA MADARA is a 58 y.o. female with an ischemic CM (EF 30%), NYHA Class II CHF, and CAD. She also presented to the hospital with VF arrest.  The patient therefore   presents today for ICD implantation.    DESCRIPTION OF PROCEDURE:  Informed written consent was obtained and the patient was brought to the electrophysiology lab in the fasting state. The patient was adequately sedated with intravenous Versed , and fentanyl  as outlined in the nursing report.  The patient's left chest was prepped and draped in the usual sterile fashion by the EP lab staff.  The skin overlying the left deltopectoral region was infiltrated with lidocaine  for local analgesia.  A 5-cm incision was made over the left deltopectoral region.  A left subcutaneous defibrillator pocket was fashioned using a combination of sharp and blunt dissection.  Electrocautery was used to assure hemostasis. RA/RV Lead Placement: The left axillary vein was cannulated with fluoroscopic visualization.  Through the left axillary vein, a Medtronic Sprint Quattro Secure [id number] (serial number [id number]) right ventricular defibrillator lead were advanced with fluoroscopic visualization into the right ventricular apex.  Initial right ventricular lead R-wave measured 8.6 mV with impedance of 551 ohms and a threshold of 0.75 volts at 0.5 milliseconds. The leads were secured to the pectoralis  fascia using #2 silk suture over the suture sleeves.  The pocket then  irrigated with copious gentamicin solution.  The leads were then  connected to a Medtronic Cobalt XT VR [id number] (serial  Number [id number]) ICD.  The defibrillator was placed into the  pocket.  The pocket was then closed in 3 layers with 2.0 Vicryl suture for the subcutaneous and 3.0 Vicryl suture subcuticular layers. EBL<54ml.  Steri-Strips and a  sterile dressing were then applied.  CONCLUSIONS:  1. Nonischemic cardiomyopathy with chronic [place]  Heart Association class II heart failure post cardiac arrest.  2. Successful ICD implantation.  3. No early apparent complications. Will [name], MD 11/25/2023 4:07 PM    Assessment & Plan:  Problem List Items  Addressed This Visit     Chronic back pain   Chronic pain.  Weight loss should help Continue with current pain regimen      Knee pain - Primary   R knee pain Use heat/topical anti-inflammatory creams, i.e. blue emu Consider using a brace Follow-up with sports medicine/orthopedic surgery      Yeast vaginitis   Prescribed Diflucan  by mouth.  Lotrisone  topical cream externally.  She was advised to see a gynecologist.      Relevant Medications   clotrimazole -betamethasone  (LOTRISONE ) cream   fluconazole  (DIFLUCAN ) 100 MG tablet      Meds ordered this encounter  Medications   clotrimazole -betamethasone  (LOTRISONE ) cream    Sig: Apply 1 Application topically 2 (two) times daily.    Dispense:  60 g    Refill:  0    Yeast rash   fluconazole  (DIFLUCAN ) 100 MG tablet    Sig: Take 2 tabs on day#1, then 1 tab daily on Days #2-7    Dispense:  8 tablet    Refill:  1   B Complex-C (B-COMPLEX WITH VITAMIN C ) tablet    Sig: Take 1 tablet by mouth daily.   Cholecalciferol (VITAMIN D3) 50 MCG (2000 UT) capsule    Sig: Take 1 capsule (2,000 Units total) by mouth daily.      Follow-up: Return in about 2 months (around 02/18/2024) for f/u with PCP.  Marolyn Noel, MD

## 2023-12-19 NOTE — Assessment & Plan Note (Addendum)
 R knee pain Use heat/topical anti-inflammatory creams, i.e. blue emu Consider using a brace Follow-up with sports medicine/orthopedic surgery

## 2023-12-20 ENCOUNTER — Other Ambulatory Visit: Payer: Self-pay | Admitting: Internal Medicine

## 2023-12-20 DIAGNOSIS — F411 Generalized anxiety disorder: Secondary | ICD-10-CM

## 2023-12-22 ENCOUNTER — Encounter (HOSPITAL_BASED_OUTPATIENT_CLINIC_OR_DEPARTMENT_OTHER): Admitting: Internal Medicine

## 2023-12-22 ENCOUNTER — Telehealth: Payer: Self-pay

## 2023-12-22 ENCOUNTER — Other Ambulatory Visit: Payer: Self-pay | Admitting: Licensed Clinical Social Worker

## 2023-12-22 DIAGNOSIS — L97828 Non-pressure chronic ulcer of other part of left lower leg with other specified severity: Secondary | ICD-10-CM | POA: Diagnosis not present

## 2023-12-22 DIAGNOSIS — G8929 Other chronic pain: Secondary | ICD-10-CM | POA: Insufficient documentation

## 2023-12-22 DIAGNOSIS — T8140XA Infection following a procedure, unspecified, initial encounter: Secondary | ICD-10-CM | POA: Diagnosis not present

## 2023-12-22 DIAGNOSIS — T798XXA Other early complications of trauma, initial encounter: Secondary | ICD-10-CM

## 2023-12-22 DIAGNOSIS — Z8674 Personal history of sudden cardiac arrest: Secondary | ICD-10-CM | POA: Diagnosis not present

## 2023-12-22 NOTE — Assessment & Plan Note (Signed)
 Prescribed Diflucan  by mouth.  Lotrisone  topical cream externally.  She was advised to see a gynecologist.

## 2023-12-22 NOTE — Telephone Encounter (Signed)
 Copied from CRM [id number]. Topic: General - Other >> Dec 22, 2023  2:38 PM Viola F wrote: Patient is calling to follow up on letter that was requested 12/18/23 - she's in need of a letter stating that her husband [name] Gallaga, takes care of her on a regular basis. Husband is the full time caregiver for patent.  This letter is needed for Food Stamps/Medicaid and letter is needed asap but has to be in their office no later than 1st of November. Patient would like a call back today with an update - if she doesn't answer she requested for office to leave detailed message.  [phone] BENNIE)

## 2023-12-22 NOTE — Assessment & Plan Note (Signed)
 Chronic pain.  Weight loss should help Continue with current pain regimen

## 2023-12-23 ENCOUNTER — Other Ambulatory Visit: Payer: Self-pay | Admitting: Licensed Clinical Social Worker

## 2023-12-23 ENCOUNTER — Ambulatory Visit: Payer: Self-pay

## 2023-12-23 DIAGNOSIS — I5042 Chronic combined systolic (congestive) and diastolic (congestive) heart failure: Secondary | ICD-10-CM | POA: Diagnosis not present

## 2023-12-23 DIAGNOSIS — I4901 Ventricular fibrillation: Secondary | ICD-10-CM | POA: Diagnosis not present

## 2023-12-23 DIAGNOSIS — F333 Major depressive disorder, recurrent, severe with psychotic symptoms: Secondary | ICD-10-CM | POA: Diagnosis not present

## 2023-12-23 DIAGNOSIS — E1159 Type 2 diabetes mellitus with other circulatory complications: Secondary | ICD-10-CM | POA: Diagnosis not present

## 2023-12-23 DIAGNOSIS — I152 Hypertension secondary to endocrine disorders: Secondary | ICD-10-CM | POA: Diagnosis not present

## 2023-12-23 DIAGNOSIS — T8149XD Infection following a procedure, other surgical site, subsequent encounter: Secondary | ICD-10-CM | POA: Diagnosis not present

## 2023-12-23 NOTE — Patient Instructions (Signed)
 Visit Information  Thank you for taking time to visit with me today. Please don't hesitate to contact me if I can be of assistance to you before our next scheduled appointment.  Our next appointment is by telephone on 02/02/24 at 10:00 AM   Please call the care guide team at [phone] if you need to cancel or reschedule your appointment.   Following is a copy of your care plan:   Goals Addressed             This Visit's Progress    VBCI Social Work Care Plan       Problems:   Mobility issues  Uses a walker or a cane as needed to help her walk. Physical therapy sessions for client have ended            Skin care issues (wound care issue)             Anxiety issues             Mental health  needs (sees counselor at [facility]             Depression issues             Some financial challenges              Memory issues              ADD                          CSW Clinical Goal(s):   Over the next 30  days the Patient will attend all scheduled Behavioral Health Counseling Appointments as scheduled to help with stress and anxiety issues AEB patient report of attending Behavioral Health appointments scheduled.              Over the next 30 days, client will use coping skills to manage depression issues faced AEB client report of reduction in depression symptoms.  Interventions:  Discussed client pain issues. Takes pain medication as prescribed Client said she had procedure for ICD implant in September of 2025             Discussed client transport needs. Client receives transport help from her spouse           Client is concerned about memory loss. She feels she needs more soupport medically.  Spouse of client is supportive.            Discussed skin care needs of client. Discussed nurse support for client in the home.  Nurse from [facility] visits client in home one time weekly. Client has slow healing wound on left leg             Client has sleep difficulty.  She spoke of neuropathy issues.                Client spoke of diurectic use. She spoke of frequency of bathroom visits               Discussed ambulation of client. She uses a cane or walker as needed. She has pain sometimes when walking                Discussed wound care of client. She has wound care for wound site on left leg (slow healing wound). She said she changes dressing daily on wound site  Discussed counseling support of client at mental health agency. She sees counselor one time per month.                Client was anxious and speaking very fast. She is worried about Food stamps application. She is communicating with PCP office to seek support for letter from PCP regarding Food stamps needs.            LCSW contacted PCP office today and asked for nurse from PCP office to call client to discuss her request for letter from PCP regarding Food stamps eligibility for client . Informed office rep that client was very anxious .                   Encouraged client to call LCSW as needed for SW support                  Discussed memory issues of client. She is concerned about some memory loss.She said that her spouse helps her with memory issues and staying on schedule.  Her son is helping her currently with client needs. Client said that her spouse helps her with medication administration                Client said she likes to sit in corner in restaurant. She is concerned about Asperger's like symptoms . Unsure of diagnosis needs of herself.                            Patient Goals/Self-Care Activities:  Client to attend scheduled medical appointments             Client to take medications as prescribed              Client to use coping skills to manage depression issues faced              Client to call LCSW as needed for SW support              Client to participate in scheduled mental health appointments              Client to communicate with RN at PCP office about current  symptoms of client              Plan:   LCSW to call client on 02/02/24 at 10:00 AM         Please go to [facility] [address], [place] [phone]) if you are experiencing a Mental Health or Behavioral Health Crisis or need someone to talk to.  Patient verbalized understanding of Care plan and visit instructions communicated this visit   [name]  MSW, LCSW /Value Based Care [facility] Licensed Clinical Social Worker Direct Dial:  [phone] Fax:  [phone] Website:  [email or web address]

## 2023-12-23 NOTE — Patient Outreach (Signed)
 Complex Care Management   Visit Note  12/23/2023  Name:  [name] MRN: [id number] DOB: 1965/05/26  Situation: Referral received for Complex Care Management related to mental health needs; medical needs management I obtained verbal consent from Patient.  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   ADD (attention deficit disorder)    Anxiety    Arthritis    both knees right worse than left   Carpal tunnel syndrome of right wrist    Chronic back pain    Dysrhythmia    history of Vtach   Enterococcus faecalis infection 04/29/2023   Essential hypertension, benign    Gallstones    History of smoking 06/22/2016   Hyperlipidemia associated with type 2 diabetes mellitus (HCC), on Zocor  04/04/2011   Hypertension associated with diabetes (HCC) 06/03/2008   MDD (major depressive disorder)    Memory impairment    post cardiac arrest   Migraines    Morbid obesity (HCC)    Morbid obesity with BMI of 50.0-59.9, adult (HCC) 07/12/2006   NICM (nonischemic cardiomyopathy) (HCC) 07/12/2006   01/16/18 ECHO:    - Procedure narrative: Transthoracic echocardiography. Image   quality was suboptimal. The study was technically difficult.   Intravenous contrast (Definity ) was administered. - Left ventricle: The cavity size was moderately dilated. Wall   thickness was increased in a pattern of mild LVH. Systolic   function was moderately to severely reduced. 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   Pseudomonas infection 04/29/2023   Spinal stenosis    back pain   Spondylosis without myelopathy or radiculopathy, lumbar region 12/04/2017   Type 2 diabetes mellitus with hyperglycemia (HCC)    Walker as ambulation aid     Assessment: Patient Reported  Symptoms:  Cognitive Cognitive Status: Alert and oriented to person, place, and time, Difficulties with attention and concentration Cognitive/Intellectual Conditions Management [RPT]: None reported or documented in medical history or problem list   Health Maintenance Behaviors: Stress management Health Facilitated by: Stress management  Neurological Neurological Review of Symptoms: Dizziness, Weakness, Headaches Neurological Management Strategies: Coping strategies  HEENT HEENT Symptoms Reported: No symptoms reported HEENT Management Strategies: Coping strategies    Cardiovascular Cardiovascular Symptoms Reported: Dizziness, Fatigue Cardiovascular Management Strategies: Coping strategies  Respiratory Respiratory Symptoms Reported: Shortness of breath Other Respiratory Symptoms: fatigues when walking Respiratory Management Strategies: Coping strategies  Endocrine Endocrine Symptoms Reported: Blurry vision, Weakness or fatigue Is patient diabetic?: Yes    Gastrointestinal Gastrointestinal Symptoms Reported: No symptoms reported Gastrointestinal Management Strategies: Coping strategies    Genitourinary Genitourinary Symptoms Reported: Frequency Genitourinary Management Strategies: Adequate rest, Medication therapy  Integumentary Integumentary Symptoms Reported: Wound Additional Integumentary Details: wound area on left leg Skin Management Strategies: Coping strategies  Musculoskeletal Musculoskelatal Symptoms Reviewed: Limited mobility, Unsteady gait, Weakness Musculoskeletal Management Strategies: Coping strategies      Psychosocial Psychosocial Symptoms Reported: Sadness - if selected complete PHQ 2-9, Anxiety - if selected complete GAD, Depression - if selected complete PHQ 2-9 Behavioral Management Strategies: Coping strategies Major Change/Loss/Stressor/Fears (CP): Medical condition, self Techniques to Cope with Loss/Stress/Change: Counseling, Diversional activities       12/23/2023    PHQ2-9 Depression Screening   Little interest or pleasure in doing things Nearly every day  Feeling down, depressed, or hopeless Nearly every day  PHQ-2 - Total Score 6  Trouble falling or staying asleep, or sleeping too much Several days  Feeling tired or having little energy More than half the days  Poor appetite or overeating  Several days  Feeling bad about yourself - or that you are a failure or have let yourself or your family down Several days  Trouble concentrating on things, such as reading the newspaper or watching television Several days  Moving or speaking so slowly that other people could have noticed.  Or the opposite - being so fidgety or restless that you have been moving around a lot more than usual Several days  Thoughts that you would be better off dead, or hurting yourself in some way Not at all  PHQ2-9 Total Score 13  If you checked off any problems, how difficult have these problems made it for you to do your work, take care of things at home, or get along with other people Somewhat difficult  Depression Interventions/Treatment Currently on Treatment    Vitals:  BP information not obtained during visit.Client did not mention any BP issues.   Medications Reviewed Today   Medications were not reviewed in this encounter     Recommendation:   PCP Follow-up Continue Current Plan of Care Attend scheduled mental health appointments Call LCSW as needed for SW support Take medications as prescribed  Follow Up Plan:   Telephone follow up appointment date/time:  02/02/24 at 10:00 AM   [name]  MSW, LCSW [place]/Value Based Care [facility] Licensed Clinical Social Worker Direct Dial:  [phone] Fax:  [phone] Website:  [email or web address]

## 2023-12-23 NOTE — Telephone Encounter (Signed)
 FYI Only or Action Required?: Action required by provider: request for documentation or forms.  Patient was last seen in primary care on 12/19/2023 by Plotnikov, Karlynn GAILS, MD.  Called Nurse Triage reporting Anxiety.  Symptoms began chronic.  Interventions attempted: Prescription medications: as prescribed  and Rest, hydration, or home remedies.  Symptoms are: unchanged.  Triage Disposition: See PCP Within 2 Weeks  Patient/caregiver understands and will follow disposition?: Yes, but will wait   Message from Mercedes MATSU sent at 12/23/2023 10:00 AM EDT  Summary: Anxious Patient   Reason for Triage: Patients social works Dietitian Forrest ; [facility]) states that the patient is anxious, she said she has been crying because she is losing her food stamps. She said she is trying to get a letter from Dr. Joshua in order for her to keep her food stamps. Her social worker said she has been talking really fast, super anxious, and over he is concerned. He is requesting a nurse give her a call because because she is also having crying episodes. She is also concerned about her slow healing leg wounds and other issues she has going on. Patient can be reached at [phone] with a phone call.   [name] Social Worker [facility]) Phone: [phone]      Reason for Disposition  [1] Symptoms of anxiety or panic attack AND [2] is a chronic symptom (recurrent or ongoing AND present > 4 weeks)  Answer Assessment - Initial Assessment Questions Additional info: 1) Patient insists on receiving call back today regarding a letter needed asap. She states she has called several times and never receiving a call back. Please anyone from care team return call to [facility] [phone]    1. CONCERN: Did anything happen that prompted you to call today?      Increased anxiety due to needing a letter to continue food stamps. Letter needs to state that her husband is her  caregiver and is required for her care.  2. ANXIETY SYMPTOMS: Can you describe how you (your loved one; patient) have been feeling? (e.g., tense, restless, panicky, anxious, keyed up, overwhelmed, sense of impending doom).      Anxiety, overwhelmed 3. ONSET: How long have you been feeling this way? (e.g., hours, days, weeks)     chronic 4. SEVERITY: How would you rate the level of anxiety? (e.g., 0 - 10; or mild, moderate, severe).     Worsening  5. FUNCTIONAL IMPAIRMENT: How have these feelings affected your ability to do daily activities? Have you had more difficulty than usual doing your normal daily activities? (e.g., getting better, same, worse; self-care, school, work, interactions)     yes 6. HISTORY: Have you felt this way before? Have you ever been diagnosed with an anxiety problem in the past? (e.g., generalized anxiety disorder, panic attacks, PTSD). If Yes, ask: How was this problem treated? (e.g., medicines, counseling, etc.)     yes 7. RISK OF HARM - SUICIDAL IDEATION: Do you ever have thoughts of hurting or killing yourself? If Yes, ask:  Do you have these feelings now? Do you have a plan on how you would do this?      8. TREATMENT:  What has been done so far to treat this anxiety? (e.g., medicines, relaxation strategies). What has helped?     Followed by psych  9. THERAPIST: Do you have a counselor or therapist? If Yes, ask: What is their name?     yes 10. POTENTIAL TRIGGERS: Do  you drink caffeinated beverages (e.g., coffee, colas, teas), and how much daily? Do you drink alcohol or use any drugs? Have you started any new medicines recently?       Changes to government assistance programs.  11. PATIENT SUPPORT: Who is with you now? Who do you live with? Do you have family or friends who you can talk to?        Yes, spouse 72. OTHER SYMPTOMS: Do you have any other symptoms? (e.g., feeling depressed, trouble concentrating, trouble  sleeping, trouble breathing, palpitations or fast heartbeat, chest pain, sweating, nausea, or diarrhea)       Knee pain, leg pain  Protocols used: Anxiety and Panic Attack-A-AH

## 2023-12-24 ENCOUNTER — Encounter (HOSPITAL_BASED_OUTPATIENT_CLINIC_OR_DEPARTMENT_OTHER)

## 2023-12-24 ENCOUNTER — Encounter

## 2023-12-24 DIAGNOSIS — F332 Major depressive disorder, recurrent severe without psychotic features: Secondary | ICD-10-CM

## 2023-12-24 DIAGNOSIS — F09 Unspecified mental disorder due to known physiological condition: Secondary | ICD-10-CM | POA: Diagnosis not present

## 2023-12-24 DIAGNOSIS — I5042 Chronic combined systolic (congestive) and diastolic (congestive) heart failure: Secondary | ICD-10-CM | POA: Diagnosis not present

## 2023-12-24 NOTE — Progress Notes (Addendum)
   Behavioral Observations:  The patient appeared well-groomed and appropriately dressed. She ambulated with the assistance of a cane. Her manners were polite and appropriate to the situation. The patient's attitude towards testing was positive and she demonstrated a good effort. She showed mild difficulties with understanding task instructions. Mood appeared generally anxious/overwhelmed, affect was consistent with mood. The patient displayed frequent tearfulness throughout the testing session. Breaks were offered when needed. There were some indications of perfectionism throughout the test.  Neuropsychology Note  [name] completed 100 minutes of neuropsychological testing with technician, Josue Ned, BA, under the supervision of Norleen Asa, PsyD., Clinical Neuropsychologist. The patient did not appear overtly distressed by the testing session, per behavioral observation or via self-report to the technician. Rest breaks were offered.   Clinical Decision Making: In considering the patient's current level of functioning, level of presumed impairment, nature of symptoms, emotional and behavioral responses during clinical interview, level of literacy, and observed level of motivation/effort, a battery of tests was selected by Dr. Asa during initial consultation on 12/11/2023. This was communicated to the technician. Communication between the neuropsychologist and technician was ongoing throughout the testing session and changes were made as deemed necessary based on patient performance on testing, technician observations and additional pertinent factors such as those listed above.  Tests Administered: Comprehensive Attention Battery (CAB) Continuous Performance Test (CPT) RBQ-2A Questionnaire  Results: Will be included in final report   Feedback to Patient: [name] will return on 04/19/2024 for an interactive feedback session with Dr. Asa at which time her test  performances, clinical impressions and treatment recommendations will be reviewed in detail. The patient understands she can contact our office should she require our assistance before this time.  100 minutes spent face-to-face with patient administering standardized tests, 20 minutes spent scoring radiographer, therapeutic). [CPT [id number], [id number]]  Full report to follow.

## 2023-12-25 ENCOUNTER — Telehealth: Payer: Self-pay | Admitting: Cardiology

## 2023-12-25 ENCOUNTER — Telehealth: Payer: Self-pay

## 2023-12-25 NOTE — Telephone Encounter (Signed)
 Patient has been made aware and later has been placed upfront.

## 2023-12-25 NOTE — Telephone Encounter (Signed)
 A message has been sent to Dr. Joshua in regards to this. This is a duplicate message and we are waiting on a response from him. Patient has been made aware and gave a verbal understanding. I have advised the patient I will follow up with her today in regards to this.

## 2023-12-25 NOTE — Telephone Encounter (Signed)
 Letter written

## 2023-12-25 NOTE — Telephone Encounter (Signed)
Please advise the letter

## 2023-12-25 NOTE — Telephone Encounter (Signed)
 Call to patient to address her concerns about letter needed from MD stating that her spouse is her caregiver so she can keep her food stamps/medicaid. Epic encounter notes indicate that she has already approached PCP Dr. Joshua about this and letter has been written, just needs signature. Patient calling our office stating she is concerned she will run out of time and asking Dr. Lavona to write a letter today that she can pick up no later than tomorrow. Forwarded to Dr. Lavona for advice.

## 2023-12-25 NOTE — Telephone Encounter (Signed)
 Patient is requesting a letter stating that her husband is her caregiver. She says this letter is a requirement and she needs it as soon as possible. She says the food stamps place called and informed her that this is needed. Please advise.

## 2023-12-25 NOTE — Telephone Encounter (Signed)
 Copied from CRM #[id number]. Topic: General - Other >> Dec 25, 2023 10:36 AM Berneda FALCON wrote: Reason for CRM: Pt states she has been calling since last week and feels her concerns are falling on deaf ears at this point. She states if she does not get a letter stating that her husband is her caretaker, that she is going to lose her food stamps. She is very worried about this and states she gets told someone would call her back but they never do.  Called the [employer] to see if there was any way we can help her with this and spoke with Rocky and she states she will talk to Dr. Tamala nurse and see if there is anything we can do to help. States this is an existing letter that needs to be updated from last year to this year. She wants to come by and pick it up today.  I let the patient know this. Please be sure to call her back today if at all possible to help ease her fears and concerns.  Patient callback is [phone]

## 2023-12-25 NOTE — Telephone Encounter (Signed)
 This is being handled in another telephone encounter that's been sent to Dr. Joshua

## 2023-12-25 NOTE — Telephone Encounter (Signed)
 Letter printed and signed by Dr. Lavona. Patient notified that it is waiting at front desk via telephone call.

## 2023-12-29 DIAGNOSIS — F988 Other specified behavioral and emotional disorders with onset usually occurring in childhood and adolescence: Secondary | ICD-10-CM | POA: Diagnosis not present

## 2023-12-29 DIAGNOSIS — I4901 Ventricular fibrillation: Secondary | ICD-10-CM | POA: Diagnosis not present

## 2023-12-29 DIAGNOSIS — I252 Old myocardial infarction: Secondary | ICD-10-CM | POA: Diagnosis not present

## 2023-12-29 DIAGNOSIS — G43909 Migraine, unspecified, not intractable, without status migrainosus: Secondary | ICD-10-CM | POA: Diagnosis not present

## 2023-12-29 DIAGNOSIS — D539 Nutritional anemia, unspecified: Secondary | ICD-10-CM | POA: Diagnosis not present

## 2023-12-29 DIAGNOSIS — E669 Obesity, unspecified: Secondary | ICD-10-CM | POA: Diagnosis not present

## 2023-12-29 DIAGNOSIS — I5042 Chronic combined systolic (congestive) and diastolic (congestive) heart failure: Secondary | ICD-10-CM | POA: Diagnosis not present

## 2023-12-29 DIAGNOSIS — I152 Hypertension secondary to endocrine disorders: Secondary | ICD-10-CM | POA: Diagnosis not present

## 2023-12-29 DIAGNOSIS — E1169 Type 2 diabetes mellitus with other specified complication: Secondary | ICD-10-CM | POA: Diagnosis not present

## 2023-12-29 DIAGNOSIS — T8149XD Infection following a procedure, other surgical site, subsequent encounter: Secondary | ICD-10-CM | POA: Diagnosis not present

## 2023-12-29 DIAGNOSIS — M47816 Spondylosis without myelopathy or radiculopathy, lumbar region: Secondary | ICD-10-CM | POA: Diagnosis not present

## 2023-12-29 DIAGNOSIS — E785 Hyperlipidemia, unspecified: Secondary | ICD-10-CM | POA: Diagnosis not present

## 2023-12-29 DIAGNOSIS — G8929 Other chronic pain: Secondary | ICD-10-CM | POA: Diagnosis not present

## 2023-12-29 DIAGNOSIS — G4733 Obstructive sleep apnea (adult) (pediatric): Secondary | ICD-10-CM | POA: Diagnosis not present

## 2023-12-29 DIAGNOSIS — E1159 Type 2 diabetes mellitus with other circulatory complications: Secondary | ICD-10-CM | POA: Diagnosis not present

## 2023-12-29 DIAGNOSIS — Z7984 Long term (current) use of oral hypoglycemic drugs: Secondary | ICD-10-CM | POA: Diagnosis not present

## 2023-12-29 DIAGNOSIS — I428 Other cardiomyopathies: Secondary | ICD-10-CM | POA: Diagnosis not present

## 2023-12-29 DIAGNOSIS — M4800 Spinal stenosis, site unspecified: Secondary | ICD-10-CM | POA: Diagnosis not present

## 2023-12-29 DIAGNOSIS — F333 Major depressive disorder, recurrent, severe with psychotic symptoms: Secondary | ICD-10-CM | POA: Diagnosis not present

## 2023-12-29 DIAGNOSIS — Z6841 Body Mass Index (BMI) 40.0 and over, adult: Secondary | ICD-10-CM | POA: Diagnosis not present

## 2023-12-29 DIAGNOSIS — G5601 Carpal tunnel syndrome, right upper limb: Secondary | ICD-10-CM | POA: Diagnosis not present

## 2023-12-29 DIAGNOSIS — Z87891 Personal history of nicotine dependence: Secondary | ICD-10-CM | POA: Diagnosis not present

## 2023-12-29 DIAGNOSIS — M170 Bilateral primary osteoarthritis of knee: Secondary | ICD-10-CM | POA: Diagnosis not present

## 2023-12-29 DIAGNOSIS — I080 Rheumatic disorders of both mitral and aortic valves: Secondary | ICD-10-CM | POA: Diagnosis not present

## 2023-12-29 DIAGNOSIS — Z9181 History of falling: Secondary | ICD-10-CM | POA: Diagnosis not present

## 2023-12-30 ENCOUNTER — Other Ambulatory Visit: Payer: Self-pay

## 2023-12-30 ENCOUNTER — Ambulatory Visit (INDEPENDENT_AMBULATORY_CARE_PROVIDER_SITE_OTHER): Admitting: Family Medicine

## 2023-12-30 VITALS — BP 128/86 | HR 87 | Ht 64.0 in

## 2023-12-30 DIAGNOSIS — M25511 Pain in right shoulder: Secondary | ICD-10-CM | POA: Diagnosis not present

## 2023-12-30 DIAGNOSIS — G8929 Other chronic pain: Secondary | ICD-10-CM | POA: Diagnosis not present

## 2023-12-30 DIAGNOSIS — R202 Paresthesia of skin: Secondary | ICD-10-CM

## 2023-12-30 DIAGNOSIS — M25561 Pain in right knee: Secondary | ICD-10-CM | POA: Diagnosis not present

## 2023-12-30 NOTE — Patient Instructions (Addendum)
 Thank you for coming in today.   You received an injection today. Seek immediate medical attention if the joint becomes red, extremely painful, or is oozing fluid.   Check back next week and we can talking about your left shoulder and neuropathy

## 2023-12-30 NOTE — Progress Notes (Signed)
 [name], PhD, LAT, ATC acting as a scribe for [name], MD.  [name] is a 58 y.o. female who presents to [facility] today for R leg and bilat shoulder pain. Pt was last seen by Dr. Lloyd on 08/19/23 for for LBP.   Last R knee and L subacromial steroid injection 07/09/23.  Today, pt reports she is favoring her R leg do to her healing wound on her L leg. She doesn't feel the steroid injections are lasting as long as they used to. She is wondering about potential for gel shots and knee brace. She notes R knee is feeling like it's going to buckle. Both shoulders are also painful w/ mechanical symptoms. She also note LBP continues.  Additionally she notes bothersome neuropathy/paresthesia both feet.  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   Exam:  BP 128/86   Pulse 87   Ht 5' 4 (1.626 m)   LMP  (LMP Unknown)   SpO2 96%   BMI 40.13 kg/m  General: Well Developed, well nourished, and in no acute distress.   MSK: Right shoulder normal-appearing decreased range of motion pain with abduction.  Left shoulder normal-appearing decreased range of motion pain with abduction.  Right knee mild effusion normal.  Normal motion.  Tender palpation.    Lab and Radiology Results  Procedure: Real-time Ultrasound Guided Injection of right knee joint superior lateral patella space Device: Philips Affiniti 50G/GE Logiq Images permanently stored and available for review in PACS Verbal informed consent obtained.  Discussed risks and benefits of procedure. Warned about infection, bleeding, hyperglycemia damage to structures among others. Patient expresses understanding and  agreement Time-out conducted.   Noted no overlying erythema, induration, or other signs of local infection.   Skin prepped in a sterile fashion.   Local anesthesia: Topical Ethyl chloride.   With sterile technique and under real time ultrasound guidance: 40 mg of Kenalog and 2 mL of Marcaine  injected into knee joint. Fluid seen entering the joint capsule.   Completed without difficulty   Pain immediately resolved suggesting accurate placement of the medication.   Advised to call if fevers/chills, erythema, induration, drainage, or persistent bleeding.   Images permanently stored and available for review in the ultrasound unit.  Impression: Technically successful ultrasound guided injection.   Procedure: Real-time Ultrasound Guided Injection of right shoulder subacromial bursa Device: Philips Affiniti 50G/GE Logiq Images permanently stored and available for review in PACS Verbal informed consent obtained.  Discussed risks and benefits of procedure. Warned about infection, bleeding, hyperglycemia damage to structures among others. Patient expresses understanding and agreement Time-out conducted.   Noted no overlying erythema, induration, or other signs of local infection.   Skin prepped in a sterile fashion.   Local anesthesia: Topical Ethyl chloride.   With sterile technique and under real time ultrasound guidance: 40 mg of Kenalog and 2 mL of Marcaine  injected into subacromial bursa. Fluid seen entering the bursa.   Completed without difficulty   Pain immediately resolved suggesting accurate placement of the medication.   Advised to call if fevers/chills, erythema, induration, drainage, or persistent bleeding.   Images permanently stored and available for review in the ultrasound unit.  Impression: Technically successful ultrasound  guided injection.         Assessment and Plan: 58 y.o. female with right knee and bilateral shoulder pain.  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  Plan for right knee injection and right shoulder injection.  Return in 1 week anticipate left shoulder injection during that visit.  Additionally we will spend more time talking about neuropathy and treatment for paresthesia.  May require nerve conduction study.   PDMP not reviewed this encounter. Orders Placed This Encounter  Procedures   US  LIMITED JOINT SPACE STRUCTURES LOW RIGHT(NO LINKED CHARGES)    Reason for Exam (SYMPTOM  OR DIAGNOSIS REQUIRED):   right knee pain    Preferred imaging location?:   [facility]   No orders of the defined types were placed in this encounter.    Discussed warning signs or symptoms. Please see discharge instructions. Patient expresses understanding.   The above documentation has been reviewed and is accurate and complete [name], M.D.

## 2023-12-31 DIAGNOSIS — E1159 Type 2 diabetes mellitus with other circulatory complications: Secondary | ICD-10-CM | POA: Diagnosis not present

## 2023-12-31 DIAGNOSIS — I5042 Chronic combined systolic (congestive) and diastolic (congestive) heart failure: Secondary | ICD-10-CM | POA: Diagnosis not present

## 2023-12-31 DIAGNOSIS — I4901 Ventricular fibrillation: Secondary | ICD-10-CM | POA: Diagnosis not present

## 2023-12-31 DIAGNOSIS — I152 Hypertension secondary to endocrine disorders: Secondary | ICD-10-CM | POA: Diagnosis not present

## 2023-12-31 DIAGNOSIS — F333 Major depressive disorder, recurrent, severe with psychotic symptoms: Secondary | ICD-10-CM | POA: Diagnosis not present

## 2023-12-31 DIAGNOSIS — T8149XD Infection following a procedure, other surgical site, subsequent encounter: Secondary | ICD-10-CM | POA: Diagnosis not present

## 2024-01-05 ENCOUNTER — Encounter (HOSPITAL_BASED_OUTPATIENT_CLINIC_OR_DEPARTMENT_OTHER): Attending: Internal Medicine | Admitting: Internal Medicine

## 2024-01-05 DIAGNOSIS — I469 Cardiac arrest, cause unspecified: Secondary | ICD-10-CM | POA: Diagnosis not present

## 2024-01-05 DIAGNOSIS — T8140XA Infection following a procedure, unspecified, initial encounter: Secondary | ICD-10-CM | POA: Diagnosis not present

## 2024-01-05 DIAGNOSIS — L97828 Non-pressure chronic ulcer of other part of left lower leg with other specified severity: Secondary | ICD-10-CM | POA: Insufficient documentation

## 2024-01-05 DIAGNOSIS — T798XXA Other early complications of trauma, initial encounter: Secondary | ICD-10-CM | POA: Insufficient documentation

## 2024-01-06 ENCOUNTER — Encounter

## 2024-01-06 ENCOUNTER — Ambulatory Visit

## 2024-01-06 DIAGNOSIS — T8149XD Infection following a procedure, other surgical site, subsequent encounter: Secondary | ICD-10-CM | POA: Diagnosis not present

## 2024-01-06 DIAGNOSIS — I4901 Ventricular fibrillation: Secondary | ICD-10-CM | POA: Diagnosis not present

## 2024-01-06 DIAGNOSIS — I152 Hypertension secondary to endocrine disorders: Secondary | ICD-10-CM | POA: Diagnosis not present

## 2024-01-06 DIAGNOSIS — I5042 Chronic combined systolic (congestive) and diastolic (congestive) heart failure: Secondary | ICD-10-CM | POA: Diagnosis not present

## 2024-01-06 DIAGNOSIS — F333 Major depressive disorder, recurrent, severe with psychotic symptoms: Secondary | ICD-10-CM | POA: Diagnosis not present

## 2024-01-06 DIAGNOSIS — E1159 Type 2 diabetes mellitus with other circulatory complications: Secondary | ICD-10-CM | POA: Diagnosis not present

## 2024-01-06 LAB — CUP PACEART REMOTE DEVICE CHECK
Battery Remaining Longevity: 170 mo
Battery Voltage: 3.16 V
Brady Statistic RA Percent Paced: INVALID
Brady Statistic RV Percent Paced: 0 %
Date Time Interrogation Session: 20251110212137
HighPow Impedance: 71 Ohm
Implantable Lead Connection Status: 753985
Implantable Lead Implant Date: 20250930
Implantable Lead Location: 753860
Implantable Pulse Generator Implant Date: 20250930
Lead Channel Impedance Value: 342 Ohm
Lead Channel Impedance Value: 437 Ohm
Lead Channel Pacing Threshold Amplitude: 0.5 V
Lead Channel Pacing Threshold Pulse Width: 0.4 ms
Lead Channel Sensing Intrinsic Amplitude: 13 mV
Lead Channel Setting Pacing Amplitude: 3.5 V
Lead Channel Setting Pacing Pulse Width: 0.4 ms
Lead Channel Setting Sensing Sensitivity: 0.3 mV
Zone Setting Status: 755011

## 2024-01-08 ENCOUNTER — Encounter: Payer: Self-pay | Admitting: Internal Medicine

## 2024-01-08 ENCOUNTER — Ambulatory Visit: Admitting: Internal Medicine

## 2024-01-08 VITALS — BP 134/78 | HR 99 | Temp 98.4°F | Resp 16 | Ht 64.0 in | Wt 235.8 lb

## 2024-01-08 DIAGNOSIS — F419 Anxiety disorder, unspecified: Secondary | ICD-10-CM

## 2024-01-08 DIAGNOSIS — E1169 Type 2 diabetes mellitus with other specified complication: Secondary | ICD-10-CM

## 2024-01-08 DIAGNOSIS — Z23 Encounter for immunization: Secondary | ICD-10-CM

## 2024-01-08 DIAGNOSIS — E785 Hyperlipidemia, unspecified: Secondary | ICD-10-CM

## 2024-01-08 DIAGNOSIS — N898 Other specified noninflammatory disorders of vagina: Secondary | ICD-10-CM

## 2024-01-08 DIAGNOSIS — E1140 Type 2 diabetes mellitus with diabetic neuropathy, unspecified: Secondary | ICD-10-CM

## 2024-01-08 DIAGNOSIS — K5904 Chronic idiopathic constipation: Secondary | ICD-10-CM | POA: Diagnosis not present

## 2024-01-08 DIAGNOSIS — D539 Nutritional anemia, unspecified: Secondary | ICD-10-CM

## 2024-01-08 LAB — FOLATE: Folate: 10.9 ng/mL (ref >5.9)

## 2024-01-08 LAB — VITAMIN B12: Vitamin B-12: 272 pg/mL (ref 211–911)

## 2024-01-08 LAB — LIPID PANEL
Cholesterol: 156 mg/dL (ref 0–200)
HDL: 61.7 mg/dL (ref >39.00)
LDL Cholesterol: 81 mg/dL (ref 0–99)
NonHDL: 94.55
Total CHOL/HDL Ratio: 3
Triglycerides: 69 mg/dL (ref 0.0–149.0)
VLDL: 13.8 mg/dL (ref 0.0–40.0)

## 2024-01-08 LAB — CBC WITH DIFFERENTIAL/PLATELET
Basophils Absolute: 0 K/uL (ref 0.0–0.1)
Basophils Relative: 0.3 % (ref 0.0–3.0)
Eosinophils Absolute: 0 K/uL (ref 0.0–0.7)
Eosinophils Relative: 0.3 % (ref 0.0–5.0)
HCT: 44.5 % (ref 36.0–46.0)
Hemoglobin: 14.7 g/dL (ref 12.0–15.0)
Lymphocytes Relative: 15.2 % (ref 12.0–46.0)
Lymphs Abs: 2.1 K/uL (ref 0.7–4.0)
MCHC: 33.2 g/dL (ref 30.0–36.0)
MCV: 83.6 fl (ref 78.0–100.0)
Monocytes Absolute: 0.9 K/uL (ref 0.1–1.0)
Monocytes Relative: 6.7 % (ref 3.0–12.0)
Neutro Abs: 10.9 K/uL — ABNORMAL HIGH (ref 1.4–7.7)
Neutrophils Relative %: 77.5 % — ABNORMAL HIGH (ref 43.0–77.0)
Platelets: 306 K/uL (ref 150.0–400.0)
RBC: 5.32 Mil/uL — ABNORMAL HIGH (ref 3.87–5.11)
RDW: 13.6 % (ref 11.5–15.5)
WBC: 14.1 K/uL — ABNORMAL HIGH (ref 4.0–10.5)

## 2024-01-08 LAB — MAGNESIUM: Magnesium: 2.2 mg/dL (ref 1.5–2.5)

## 2024-01-08 LAB — TSH: TSH: 2.26 u[IU]/mL (ref 0.35–5.50)

## 2024-01-08 MED ORDER — COVID-19 MRNA VAC-TRIS(PFIZER) 30 MCG/0.3ML IM SUSY: 0.3000 mL | PREFILLED_SYRINGE | Freq: Once | INTRAMUSCULAR | 0 refills | Status: AC

## 2024-01-08 MED ORDER — PREGABALIN 25 MG PO CAPS: 25.0000 mg | ORAL_CAPSULE | Freq: Three times a day (TID) | ORAL | 0 refills | Status: DC

## 2024-01-08 MED ORDER — PHENTERMINE HCL 37.5 MG PO TABS: 37.5000 mg | ORAL_TABLET | Freq: Every day | ORAL | 0 refills | Status: AC

## 2024-01-08 NOTE — Progress Notes (Unsigned)
 Subjective:  Patient ID: [name], female    DOB: [birth year]  Age: 58 y.o. MRN: [id number]  CC: Colonoscopy (Patient states that she's have very long periods of times when she's not using the bathroom. ), Numbness (Tinging in her feet that she wants to discuss. The medication that she is currently on isn't helping. ), and Pain Medication (Patient states that she wants you to go through her chart and make sure nobody thinks that she's a drug addict when it comes to pain medicine. Pain management wouldn't give her any medication at all)   HPI [name] presents for f/up ---  Discussed the use of AI scribe software for clinical note transcription with the patient, who gave verbal consent to proceed.  History of Present Illness [name] is a 58 year old female who presents with concerns about pain management and constipation.  She has a history of ineffective pain management, noting that medications such as morphine  and oxycodone  do not alleviate her pain. She is frustrated with being perceived as a drug addict when seeking pain relief and emphasizes that she is not currently taking any pain medications, except possibly Tylenol . She describes having a 'weird chemistry' where many medications, including those for depression and anxiety, do not work for her.  She is experiencing significant constipation, which she attributes to a recent course of antibiotics. Despite using Miralax , prune juice, prunes, and occasionally Dulcolax, she reports minimal relief. She describes having to manually assist bowel movements and occasionally notices blood, which she attributes to scratching during this process. She has a history of irregular bowel movements since childhood and expresses concern about her current condition. Her father had colitis, prompting her to have regular colonoscopies in the past.  She mentions a history of weight loss but notes recent weight gain due to emotional eating  triggered by stress, anxiety, and depression. She previously used Wegovy successfully for weight management but discontinued it when she felt stable. She now seeks assistance to manage her weight again.  She reports a persistent yeast infection following antibiotic use, which has improved with treatment but not completely resolved. She is seeking a recommendation for a gynecologist as she has lost contact with her previous provider.  She mentions feeling overwhelmed and having memory issues, which she attributes to her current stress and emotional state. She has not worked since October 2022 and is seeking assistance with paperwork related to her inability to work. She reports anxiety-related breathing difficulties but no other respiratory issues. She is unsure if she has received a flu shot this year and expresses interest in receiving a COVID-19 vaccine.     Outpatient Medications Prior to Visit  Medication Sig Dispense Refill   Accu-Chek Softclix Lancets lancets Use as instructed 100 each 12   AMBULATORY NON FORMULARY MEDICATION Off-loading wheel chair seat cusion Dispense 1 Dx code: M53.3 Use as needed 1 Units 0   atorvastatin  (LIPITOR ) 80 MG tablet TAKE 1 TABLET BY MOUTH EVERY DAY 90 tablet 0   B Complex-C (B-COMPLEX WITH VITAMIN C ) tablet Take 1 tablet by mouth daily.     Blood Glucose Monitoring Suppl DEVI 1 each by Does not apply route in the morning, at noon, and at bedtime. May substitute to any manufacturer covered by patient's insurance. 1 each 0   Cholecalciferol (VITAMIN D3) 50 MCG (2000 UT) capsule Take 1 capsule (2,000 Units total) by mouth daily.     clotrimazole -betamethasone  (LOTRISONE ) cream Apply 1 Application topically 2 (  two) times daily. 60 g 0   collagenase  (SANTYL ) 250 UNIT/GM ointment Apply 1 Application topically daily.     dapagliflozin  propanediol (FARXIGA ) 10 MG TABS tablet TAKE 1 TABLET BY MOUTH EVERY DAY 90 tablet 3   fluconazole  (DIFLUCAN ) 100 MG tablet Take 2  tabs on day#1, then 1 tab daily on Days #2-7 8 tablet 1   furosemide  (LASIX ) 40 MG tablet Take 1 tablet (40 mg total) by mouth daily. 90 tablet 3   gentamicin ointment (GARAMYCIN) 0.1 % Apply 1 Application topically at bedtime.     hydrOXYzine  (ATARAX ) 25 MG tablet TAKE 1 TABLET BY MOUTH THREE TIMES A DAY AS NEEDED 270 tablet 0   losartan  (COZAAR ) 25 MG tablet Take 0.5 tablets (12.5 mg total) by mouth daily.     magnesium  oxide (MAG-OX) 400 MG tablet Take 1 tablet (400 mg total) by mouth at bedtime. 90 tablet 1   meclizine  (ANTIVERT ) 12.5 MG tablet Take 1 tablet (12.5 mg total) by mouth 3 (three) times daily as needed. 40 tablet 1   metFORMIN  (GLUCOPHAGE -XR) 500 MG 24 hr tablet TAKE 1 TABLET BY MOUTH 2 TIMES DAILY WITH A MEAL. 180 tablet 1   metoprolol  succinate (TOPROL -XL) 50 MG 24 hr tablet TAKE 1 TABLET BY MOUTH DAILY. TAKE WITH OR IMMEDIATELY FOLLOWING A MEAL. 90 tablet 3   NON FORMULARY Pt uses cpap nightly     polyethylene glycol powder (GLYCOLAX /MIRALAX ) 17 GM/SCOOP powder Take 1 capful (17 g) in water 2 (two) times daily. 765 g 0   potassium chloride  SA (KLOR-CON  M) 20 MEQ tablet TAKE 1 TABLET BY MOUTH EVERY DAY 90 tablet 3   spironolactone  (ALDACTONE ) 25 MG tablet TAKE 1/2 TABLET BY MOUTH EVERY DAY 45 tablet 2   No facility-administered medications prior to visit.    ROS Review of Systems  Constitutional:  Positive for unexpected weight change (wt gain). Negative for appetite change, chills, diaphoresis and fatigue.  HENT: Negative.    Eyes: Negative.   Respiratory: Negative.  Negative for cough, chest tightness, shortness of breath and wheezing.   Cardiovascular:  Negative for chest pain, palpitations and leg swelling.  Gastrointestinal:  Positive for blood in stool and constipation. Negative for abdominal distention, abdominal pain, anal bleeding, diarrhea, nausea and vomiting.  Genitourinary:  Positive for vaginal discharge. Negative for difficulty urinating, dysuria, flank pain,  hematuria, pelvic pain and vaginal bleeding.  Musculoskeletal:  Positive for arthralgias, back pain and gait problem. Negative for joint swelling and myalgias.       Numbness, stinging/stabbling pain in her feet that interferes with her sleep  Skin: Negative.  Negative for color change.  Neurological:  Positive for numbness. Negative for dizziness, weakness and light-headedness.  Hematological:  Negative for adenopathy. Does not bruise/bleed easily.  Psychiatric/Behavioral:  Positive for decreased concentration, dysphoric mood and sleep disturbance. Negative for behavioral problems, confusion and suicidal ideas. The patient is nervous/anxious.     Objective:  BP 134/78 (BP Location: Left Arm, Patient Position: Sitting, Cuff Size: Normal)   Pulse 99   Temp 98.4 F (36.9 C) (Oral)   Resp 16   Ht 5' 4 (1.626 m)   Wt 235 lb 12.8 oz (107 kg)   LMP  (LMP Unknown)   SpO2 99%   BMI 40.47 kg/m   BP Readings from Last 3 Encounters:  01/08/24 134/78  12/30/23 128/86  12/19/23 130/84    Wt Readings from Last 3 Encounters:  01/08/24 235 lb 12.8 oz (107 kg)  12/19/23 233  lb 12.8 oz (106.1 kg)  11/25/23 228 lb (103.4 kg)    Physical Exam Vitals reviewed.  Constitutional:      General: She is not in acute distress.    Appearance: She is obese. She is ill-appearing. She is not toxic-appearing or diaphoretic.  HENT:     Nose: Nose normal.     Mouth/Throat:     Mouth: Mucous membranes are moist.  Eyes:     General: No scleral icterus.    Conjunctiva/sclera: Conjunctivae normal.  Cardiovascular:     Rate and Rhythm: Normal rate and regular rhythm.     Heart sounds: No murmur heard.    No friction rub. No gallop.  Pulmonary:     Effort: Pulmonary effort is normal.     Breath sounds: No stridor. No wheezing, rhonchi or rales.  Abdominal:     General: Abdomen is protuberant. Bowel sounds are normal. There is no distension.     Palpations: Abdomen is soft. There is no hepatomegaly,  splenomegaly or mass.     Tenderness: There is no abdominal tenderness. There is no guarding.  Musculoskeletal:        General: Normal range of motion.     Cervical back: Neck supple.     Right lower leg: No edema.     Left lower leg: No edema.  Lymphadenopathy:     Cervical: No cervical adenopathy.  Skin:    General: Skin is warm and dry.     Findings: No rash.  Neurological:     General: No focal deficit present.  Psychiatric:        Attention and Perception: Perception normal. She is inattentive.        Mood and Affect: Mood is anxious and depressed. Affect is tearful. Affect is not labile or flat.        Speech: Speech is tangential. Speech is not delayed.        Behavior: Behavior normal. Behavior is cooperative.        Thought Content: Thought content normal. Thought content is not paranoid or delusional. Thought content does not include homicidal or suicidal ideation.        Cognition and Memory: Cognition normal.     Lab Results  Component Value Date   WBC 14.1 (H) 01/08/2024   HGB 14.7 01/08/2024   HCT 44.5 01/08/2024   PLT 306.0 01/08/2024   GLUCOSE 128 (H) 11/06/2023   CHOL 156 01/08/2024   TRIG 69.0 01/08/2024   HDL 61.70 01/08/2024   LDLDIRECT 143.8 04/03/2011   LDLCALC 81 01/08/2024   ALT 28 11/06/2023   AST 34 11/06/2023   NA 138 11/06/2023   K 4.1 11/06/2023   CL 100 11/06/2023   CREATININE 1.02 11/06/2023   BUN 28 (H) 11/06/2023   CO2 28 11/06/2023   TSH 2.26 01/08/2024   INR 1.2 02/25/2022   HGBA1C 6.3 11/06/2023   MICROALBUR 1.0 05/22/2023    DG Chest 2 View Result Date: 11/25/2023 CLINICAL DATA:  Status post ICD implant. EXAM: CHEST - 2 VIEW COMPARISON:  May 19, 2023 FINDINGS: The lateral views limited in evaluation secondary to positioning of the patient's upper extremity. A properly positioned single lead ventricular pacer is noted. The heart size and mediastinal contours are within normal limits. Both lungs are clear. There is mild to  moderate severity dextroscoliosis of the mid to lower thoracic spine. IMPRESSION: No active cardiopulmonary disease. Electronically Signed   By: Suzen Dials M.D.   On: 11/25/2023 19:34  EP PPM/ICD IMPLANT Result Date: 11/25/2023 SURGEON:  Soyla Norton, MD    PREPROCEDURE DIAGNOSES:  1. Nonischemic cardiomyopathy.  2. [place]  Heart Association class II, heart failure chronically.  3. Cardiac arrest  POSTPROCEDURE DIAGNOSES:  1. Nonischemic cardiomyopathy.  2. [place]  Heart Association class II heart failure chronically.  3. Cardiac arrest  PROCEDURES:   1. ICD implantation.  INTRODUCTION:  DESHANTI ADCOX is a 58 y.o. female with an ischemic CM (EF 30%), NYHA Class II CHF, and CAD. She also presented to the hospital with VF arrest.  The patient therefore  presents today for ICD implantation.    DESCRIPTION OF PROCEDURE:  Informed written consent was obtained and the patient was brought to the electrophysiology lab in the fasting state. The patient was adequately sedated with intravenous Versed , and fentanyl  as outlined in the nursing report.  The patient's left chest was prepped and draped in the usual sterile fashion by the EP lab staff.  The skin overlying the left deltopectoral region was infiltrated with lidocaine  for local analgesia.  A 5-cm incision was made over the left deltopectoral region.  A left subcutaneous defibrillator pocket was fashioned using a combination of sharp and blunt dissection.  Electrocautery was used to assure hemostasis. RA/RV Lead Placement: The left axillary vein was cannulated with fluoroscopic visualization.  Through the left axillary vein, a Medtronic Sprint Quattro Secure [id number] (serial number [id number]) right ventricular defibrillator lead were advanced with fluoroscopic visualization into the right ventricular apex.  Initial right ventricular lead R-wave measured 8.6 mV with impedance of 551 ohms and a threshold of 0.75 volts at 0.5 milliseconds. The leads were  secured to the pectoralis  fascia using #2 silk suture over the suture sleeves.  The pocket then  irrigated with copious gentamicin solution.  The leads were then  connected to a Medtronic Cobalt XT VR [id number] (serial  Number [id number]) ICD.  The defibrillator was placed into the  pocket.  The pocket was then closed in 3 layers with 2.0 Vicryl suture for the subcutaneous and 3.0 Vicryl suture subcuticular layers. EBL<77ml.  Steri-Strips and a  sterile dressing were then applied.  CONCLUSIONS:  1. Nonischemic cardiomyopathy with chronic [place]  Heart Association class II heart failure post cardiac arrest.  2. Successful ICD implantation.  3. No early apparent complications. Will [name], MD 11/25/2023 4:07 PM    Assessment & Plan:  Hyperlipidemia associated with type 2 diabetes mellitus (HCC), on Zocor - LDL goal achieved. Doing well on the statin  -     Lipid panel; Future -     TSH; Future  Need for immunization against influenza -     Flu vaccine trivalent PF, 6mos and older(Flulaval,Afluria,Fluarix,Fluzone)  Morbid obesity (HCC) -     Phentermine HCl; Take 1 tablet (37.5 mg total) by mouth daily before breakfast.  Dispense: 90 tablet; Refill: 0 -     COVID-19 mRNA Vac-TriS(Pfizer); Inject 0.3 mLs into the muscle once for 1 dose.  Dispense: 0.3 mL; Refill: 0  Deficiency anemia -     Reticulocytes; Future -     Vitamin B1; Future -     Zinc ; Future -     Methylmalonic acid, serum; Future -     CBC with Differential/Platelet; Future -     Vitamin B12; Future -     Folate; Future  Chronic idiopathic constipation -     Magnesium ; Future -     Ambulatory referral to Gastroenterology -     Lubiprostone;  Take 1 capsule (24 mcg total) by mouth 2 (two) times daily with a meal.  Dispense: 180 capsule; Refill: 0  Anxiety -     AMB Referral [facility]BC[facility] Care Management  Vaginal discharge -     Ambulatory referral to Gynecology  Painful diabetic neuropathy (HCC) -     Pregabalin ; Take 1  capsule (25 mg total) by mouth 3 (three) times daily.  Dispense: 270 capsule; Refill: 0     Follow-up: Return in about 3 months (around 04/09/2024).  Debby Molt, MD

## 2024-01-08 NOTE — Patient Instructions (Signed)

## 2024-01-09 ENCOUNTER — Other Ambulatory Visit: Payer: Self-pay | Admitting: Nurse Practitioner

## 2024-01-09 ENCOUNTER — Other Ambulatory Visit: Payer: Self-pay | Admitting: Internal Medicine

## 2024-01-09 DIAGNOSIS — K5904 Chronic idiopathic constipation: Secondary | ICD-10-CM

## 2024-01-09 DIAGNOSIS — E1165 Type 2 diabetes mellitus with hyperglycemia: Secondary | ICD-10-CM

## 2024-01-09 MED ORDER — LUBIPROSTONE 24 MCG PO CAPS: 24.0000 ug | ORAL_CAPSULE | Freq: Two times a day (BID) | ORAL | 0 refills | Status: DC

## 2024-01-09 NOTE — Progress Notes (Signed)
 Remote ICD Transmission

## 2024-01-12 ENCOUNTER — Telehealth: Payer: Self-pay | Admitting: *Deleted

## 2024-01-12 ENCOUNTER — Other Ambulatory Visit: Payer: Self-pay

## 2024-01-12 ENCOUNTER — Ambulatory Visit (INDEPENDENT_AMBULATORY_CARE_PROVIDER_SITE_OTHER): Admitting: Family Medicine

## 2024-01-12 ENCOUNTER — Encounter: Payer: Self-pay | Admitting: Family Medicine

## 2024-01-12 VITALS — BP 122/60 | HR 99 | Ht 64.0 in | Wt 238.0 lb

## 2024-01-12 DIAGNOSIS — M25561 Pain in right knee: Secondary | ICD-10-CM | POA: Diagnosis not present

## 2024-01-12 DIAGNOSIS — G8929 Other chronic pain: Secondary | ICD-10-CM

## 2024-01-12 DIAGNOSIS — M25512 Pain in left shoulder: Secondary | ICD-10-CM

## 2024-01-12 DIAGNOSIS — M25511 Pain in right shoulder: Secondary | ICD-10-CM

## 2024-01-12 DIAGNOSIS — E1140 Type 2 diabetes mellitus with diabetic neuropathy, unspecified: Secondary | ICD-10-CM

## 2024-01-12 MED ORDER — PREGABALIN 75 MG PO CAPS: 75.0000 mg | ORAL_CAPSULE | Freq: Every evening | ORAL | 3 refills | Status: AC | PRN

## 2024-01-12 NOTE — Progress Notes (Unsigned)
   I, Claretha Schimke am a scribe for Dr. [name], MD.  [name] is a 58 y.o. female who presents to [facility] at [facility] today for f/u R knee and R shoulder pain. Pt was last seen by Dr. Lloyd on 12/30/23 and was given a R knee and R subacromial steroid injections.  Today, pt reports that she is back to discuss the gel for the knee. Neuropathy in feet and her options. Returning her left shoulder injection but she wants to hold off on getting it because it isn't hurting that bad.   Dx testing: 03/04/23 L shoulder XR              12/20/22 R shoulder XR  07/24/22 L-spine MRI  Pertinent review of systems: No fevers or chills  Relevant historical information: Hypertension and diabetes.  Painful diabetic neuropathy.  Sudden cardiac collapse   Exam:  BP 122/60   Pulse 99   Ht 5' 4 (1.626 m)   Wt 238 lb (108 kg)   LMP  (LMP Unknown)   SpO2 97%   BMI 40.85 kg/m  General: Well Developed, well nourished, and in no acute distress.   MSK: Right knee intact strength pain with extension.  Bilateral shoulders decreased range of motion.    Lab and Radiology Results No results found. However, due to the size of the patient record, not all encounters were searched. Please check Results Review for a complete set of results. No results found.     Assessment and Plan: 58 y.o. female with shoulder and knee pain.  Status post injection.  She will benefit from physical therapy.  Plan to refer to PT.  She has used the [facility] location in the past. We also spent time talking about neuropathy.  Will try Lyrica  at bedtime.  Next up would be a nerve conduction study but she wants to wait on that for now.   PDMP not reviewed this encounter. Orders Placed This Encounter  Procedures   US  LIMITED JOINT SPACE STRUCTURES UP BILAT(NO LINKED CHARGES)    Reason for Exam (SYMPTOM  OR DIAGNOSIS REQUIRED):   shoulder pain    Preferred imaging location?:   [place]  [facility]   Ambulatory referral to Physical Therapy    Referral Priority:   Routine    Referral Type:   Physical Medicine    Referral Reason:   Specialty Services Required    Requested Specialty:   Physical Therapy    Number of Visits Requested:   1   Meds ordered this encounter  Medications   pregabalin  (LYRICA ) 75 MG capsule    Sig: Take 1 capsule (75 mg total) by mouth at bedtime as needed (nerve pain).    Dispense:  30 capsule    Refill:  3     Discussed warning signs or symptoms. Please see discharge instructions. Patient expresses understanding.   The above documentation has been reviewed and is accurate and complete [name], M.D.

## 2024-01-12 NOTE — Patient Instructions (Addendum)
 Thank you for coming in today.   I've referred you to Physical Therapy.  Let us  know if you don't hear from them in one week.   Let me know if you would like the gel shots authorize for your knee  Let me know if you would like me to order a Nerve Conduction Study

## 2024-01-12 NOTE — Progress Notes (Signed)
 Care Guide Pharmacy Note  01/12/2024 Name: [name] MRN: [id number] DOB: [birth year]  Referred By: Joshua Debby CROME, MD Reason for referral: Complex Care Management (Outreach to schedule referral with pharmacist )   [name] is a 58 y.o. year old female who is a primary care patient of Joshua Debby CROME, MD.  [name] was referred to the pharmacist for assistance related to: medication management   Successful contact was made with the patient to discuss pharmacy services including being ready for the pharmacist to call at least 5 minutes before the scheduled appointment time and to have medication bottles and any blood pressure readings ready for review. The patient agreed to meet with the pharmacist via telephone visit on 01/19/2024  Thedford Franks, CMA [place]  [facility], [facility] Guide Direct Dial: [phone]  Fax: [phone] Website: [email or web address]

## 2024-01-13 ENCOUNTER — Telehealth: Payer: Self-pay | Admitting: Family Medicine

## 2024-01-13 ENCOUNTER — Ambulatory Visit: Payer: Self-pay | Admitting: Cardiology

## 2024-01-13 DIAGNOSIS — G8929 Other chronic pain: Secondary | ICD-10-CM

## 2024-01-13 LAB — VITAMIN B1: Vitamin B1 (Thiamine): 21 nmol/L (ref 8–30)

## 2024-01-13 NOTE — Addendum Note (Signed)
 Addended by: GEROME ILEANA RAMAN on: 01/13/2024 12:48 PM   Modules accepted: Orders

## 2024-01-13 NOTE — Progress Notes (Incomplete)
 Assessment/Plan:    Memory impairment of unclear etiology, likely multifactorial   [name] is a very pleasant 58 y.o. RH female with a history ofhypertension, hyperlipidemia, NICM status post ICD implant, CHF, status post cardiac arrest /VF with 40 minutes of CPR, OSA unable to tolerate CPAP, CKD, DM2, ADD, anxiety, major depression, anemia, chronic pain  presenting today in follow-up for evaluation of memory loss.  In the past, patient had been referred to neuropsych evaluation at [facility] but she refused and completed the paperwork therefore she did not proceed with the testing, to date, there is no formal diagnosis***.     Recommendations:   Follow up in   months. Replenish B12, follow-up with PCP Recommend to proceed with neuropsych evaluation at an outside facility since patient is less than 22 years old) Recommend good control of cardiovascular risk factors Continue to control mood as per [facility]    Subjective:   This patient is accompanied in the office by her husband who supplements the history. Previous records as well as any outside records available were reviewed prior to todays visit.   Patient was last seen on 07/14/2023, last MoCA was 20/30***.    Any changes in memory since last visit? .  She continues to have difficulty with short-term memory including conversations, new information, some names.  She has been feeling overwhelmed after her cardiac arrest to which she attributes to her major cognitive changes.  She also states that may have ADD and possibly Tourette's. repeats oneself?  Endorsed Disoriented when walking into a room?  Patient denies ***  Misplacing objects?  Patient denies   Wandering behavior?   Denies. Any personality changes since last visit?  Reports a feeling of overwhelm, she states that many medications including those for depression and anxiety do not work for her.*** Any worsening depression?:  History of depression, follows with  psychotherapist since May 2025*** Hallucinations or paranoia?  Denies.   Seizures?   Denies.    Any sleep changes? Sleeps well***. Does not sleep very well***.   She reports always having had vivid dreams, denies REM behavior or sleepwalking   Sleep apnea?  Endorsed, not very compliant with CPAP***  Any hygiene concerns?   Denies.   Independent of bathing and dressing?  Needs assistance by her husband due to mobility issues and chronic pain Does the patient needs help with medications?  Husband is in charge *** Who is in charge of the finances?  Husband is in charge   *** Any changes in appetite?  Increase, attributed to emotional eating triggered by stress, anxiety and depression ***   Patient have trouble swallowing?  Denies.   Does the patient cook?  Any kitchen accidents such as leaving the stove on?   Denies.   Any headaches?    Denies.   Vision changes? Denies. Chronic pain?  Endorsed, has a history of chronic low back pain on steroid injections follows with pain clinic. Ambulates with difficulty?    Uses a scooter when she goes out at home she uses a walker or a cane to prevent fall but of the day she is on the couch according to her***  Recent falls or head injuries?    Denies.      Unilateral weakness, numbness or tingling?  Chronic neuropathic pain, recently started pregabalin  by PCP Any tremors?  Denies.   Any anosmia?    Denies.   Any incontinence of urine?  Denies.   Any bowel dysfunction?  She has issues with constant patient    Patient lives with her husband and has some HHN.*** Does the patient drive?  No longer drives due to mobility issues   MRI of the brain without contrast July 28, 2023 personally reviewed without age advanced or lobar predominant cerebral atrophy, and a possible remote punctate focus of chronic microhemorrhage or calcification, no evidence of acute intracranial abnormality   Initial visit 03/14/2023 How long did patient have memory difficulties?    Absolutely , since I know I have ADD!, maybe worse since the last hospitalization.  Reports some difficulty remembering new information, conversations and names.  Long-term memory is good.  She is not very active I seat all the time, I am on my chair all day .  repeats oneself?  Endorsed Disoriented when walking into a room?  Patient denies  Leaving objects in unusual places? Denies.  Wandering behavior?  Denies.  Any personality changes?  Denies.   Any history of depression?:  Denies.  A lot more depressed.Very angry , it is too much!.  I feel very overwhelmed.  Not necessarily suicidal but my life su*&s, I am losing care. She has a therapist  who has cancelled her a few times.  She is looking for new BH.  Hallucinations or paranoia?  Denies   Seizures?  Denies    Any sleep changes?   Does not sleep well, Reports vivid dreamsalways did, denies REM behavior or sleepwalking   Sleep apnea? Endorsed, does not use a CPAP. I know I should but I can't put a mask on my face now Any hygiene concerns?  Needs assistance, my husband has to help me for everything .    Independent of bathing and dressing?  Endorsed  Does the patient needs help with medications? Husband is in charge   Who is in charge of the finances? Husband is in charge     Any changes in appetite?  Since hospitalization, smells bother me. I have a hard time eating, lost 50 lbs since October.     Patient have trouble swallowing? Denies.   Does the patient cook? No.  Any kitchen accidents such as leaving the stove on? Denies.   Any history of headaches? Remote, during fertile years. Not frequently.  Chronic pain ?Yes, takes oxycodone , and other pain meds.  Ambulates with difficulty?  Uses a walker and a cane at home, needs assistance to prevent falls. She also has a scooter when going to the stores Recent falls or head injuries? No head injuries.  Vision changes? Denies.   Unilateral weakness, numbness or tingling?  Denies.   Any tremors?   Denies.   Any anosmia?  Smells make me nauseous Any incontinence of urine? Over the last few years.  Any bowel dysfunction? Constipation.  Patient lives with her husband.    History of heavy alcohol intake? Denies.   History of heavy tobacco use? Denies.   Family history of dementia? Denies.  Does patient drive? Until October, cannot due to mobility issues    CT of the head on 12/05/2022 with questionable area of low density in the right frontal lobe questionable artifactual, gray-white differentiation preserved, no acute findings.  Frontal hyperostosis noted.   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/2023   ADD (attention deficit disorder)    Anxiety    Arthritis    both knees right worse than left   Carpal tunnel syndrome of right wrist    Chronic back pain  Dysrhythmia    history of Vtach   Enterococcus faecalis infection 04/29/2023   Essential hypertension, benign    Gallstones    History of smoking 06/22/2016   Hyperlipidemia associated with type 2 diabetes mellitus (HCC), on Zocor  04/04/2011   Hypertension associated with diabetes (HCC) 06/03/2008   MDD (major depressive disorder)    Memory impairment    post cardiac arrest   Migraines    Morbid obesity (HCC)    Morbid obesity with BMI of 50.0-59.9, adult (HCC) 07/12/2006   NICM (nonischemic cardiomyopathy) (HCC) 07/12/2006   01/16/18 ECHO:    - Procedure narrative: Transthoracic echocardiography. Image   quality was suboptimal. The study was technically difficult.   Intravenous contrast (Definity ) was administered. - Left ventricle: The cavity size was moderately dilated. Wall   thickness was increased in a pattern of mild LVH. Systolic   function was moderately to severely reduced. The estimated   ejection fraction w   Normal coronary arteries 04/17/2016   OSA on CPAP 10/28/2014   does not use CPAP- pt cannot tolerate CPAP   Osteomyelitis of tibia (HCC)  07/15/2023   Pseudomonas infection 04/29/2023   Spinal stenosis    back pain   Spondylosis without myelopathy or radiculopathy, lumbar region 12/04/2017   Type 2 diabetes mellitus with hyperglycemia (HCC)    Walker as ambulation aid      Past Surgical History:  Procedure Laterality Date   CHOLECYSTECTOMY     DILATATION & CURETTAGE/HYSTEROSCOPY WITH MYOSURE N/A 10/31/2017   Procedure: DILATATION & CURETTAGE/HYSTEROSCOPY WITH MYOSURE;  Surgeon: Jannis Kate Norris, MD;  Location: [facility] ORS;  Service: Gynecology;  Laterality: N/A;   HYSTEROSCOPY WITH D & C N/A 05/07/2022   Procedure: DILATATION AND CURETTAGE /HYSTEROSCOPY;  Surgeon: Jeralyn Crutch, MD;  Location: [facility] OR;  Service: Gynecology;  Laterality: N/A;   ICD IMPLANT N/A 11/25/2023   Procedure: ICD IMPLANT;  Surgeon: Inocencio Soyla Lunger, MD;  Location: [facility] INVASIVE CV LAB;  Service: Cardiovascular;  Laterality: N/A;   INCISION AND DRAINAGE OF WOUND Left 02/06/2023   Procedure: debridement of left leg wound, application of myriad;  Surgeon: Waddell Leonce NOVAK, MD;  Location: [facility];  Service: Plastics;  Laterality: Left;   INCISION AND DRAINAGE OF WOUND Left 02/24/2023   Procedure: application of tissue replacement matrix of left patellar tendon;  Surgeon: Waddell Leonce NOVAK, MD;  Location: [facility] OR;  Service: Plastics;  Laterality: Left;   INCISION AND DRAINAGE OF WOUND Left 04/15/2023   Procedure: irrigation and debridement to left lower extremity wound;  Surgeon: Waddell Leonce NOVAK, MD;  Location: [facility] OR;  Service: Plastics;  Laterality: Left;   IRRIGATION AND DEBRIDEMENT KNEE  02/24/2023   Procedure: IRRIGATION AND DEBRIDEMENT KNEE;  Surgeon: Waddell Leonce NOVAK, MD;  Location: [facility] OR;  Service: Plastics;;   life vest     RIGHT/LEFT HEART CATH AND CORONARY ANGIOGRAPHY N/A 04/11/2016   Procedure: Right/Left Heart Cath and Coronary Angiography;  Surgeon: Lonni JONETTA Cash, MD;  Location: [facility] INVASIVE CV LAB;  Service: Cardiovascular;   Laterality: N/A;   RIGHT/LEFT HEART CATH AND CORONARY ANGIOGRAPHY N/A 12/09/2022   Procedure: RIGHT/LEFT HEART CATH AND CORONARY ANGIOGRAPHY;  Surgeon: Ladona Heinz, MD;  Location: [facility] INVASIVE CV LAB;  Service: Cardiovascular;  Laterality: N/A;   sonogram for blood clots     no blockages     PREVIOUS MEDICATIONS:   CURRENT MEDICATIONS:  Outpatient Encounter Medications as of 01/14/2024  Medication Sig   ACCU-CHEK GUIDE TEST test strip TEST BLOOD  SUGARS MORNING, NOON, AND AT BEDTIME   Accu-Chek Softclix Lancets lancets Use as instructed   AMBULATORY NON FORMULARY MEDICATION Off-loading wheel chair seat cusion Dispense 1 Dx code: M53.3 Use as needed   atorvastatin  (LIPITOR ) 80 MG tablet TAKE 1 TABLET BY MOUTH EVERY DAY   B Complex-C (B-COMPLEX WITH VITAMIN C ) tablet Take 1 tablet by mouth daily.   Blood Glucose Monitoring Suppl DEVI 1 each by Does not apply route in the morning, at noon, and at bedtime. May substitute to any manufacturer covered by patient's insurance.   Cholecalciferol (VITAMIN D3) 50 MCG (2000 UT) capsule Take 1 capsule (2,000 Units total) by mouth daily.   clotrimazole -betamethasone  (LOTRISONE ) cream Apply 1 Application topically 2 (two) times daily.   collagenase  (SANTYL ) 250 UNIT/GM ointment Apply 1 Application topically daily.   dapagliflozin  propanediol (FARXIGA ) 10 MG TABS tablet TAKE 1 TABLET BY MOUTH EVERY DAY   fluconazole  (DIFLUCAN ) 100 MG tablet Take 2 tabs on day#1, then 1 tab daily on Days #2-7   furosemide  (LASIX ) 40 MG tablet Take 1 tablet (40 mg total) by mouth daily.   gentamicin ointment (GARAMYCIN) 0.1 % Apply 1 Application topically at bedtime.   hydrOXYzine  (ATARAX ) 25 MG tablet TAKE 1 TABLET BY MOUTH THREE TIMES A DAY AS NEEDED   losartan  (COZAAR ) 25 MG tablet Take 0.5 tablets (12.5 mg total) by mouth daily.   lubiprostone (AMITIZA) 24 MCG capsule Take 1 capsule (24 mcg total) by mouth 2 (two) times daily with a meal.   magnesium  oxide (MAG-OX) 400  MG tablet Take 1 tablet (400 mg total) by mouth at bedtime.   meclizine  (ANTIVERT ) 12.5 MG tablet Take 1 tablet (12.5 mg total) by mouth 3 (three) times daily as needed.   metFORMIN  (GLUCOPHAGE -XR) 500 MG 24 hr tablet TAKE 1 TABLET BY MOUTH 2 TIMES DAILY WITH A MEAL.   metoprolol  succinate (TOPROL -XL) 50 MG 24 hr tablet TAKE 1 TABLET BY MOUTH DAILY. TAKE WITH OR IMMEDIATELY FOLLOWING A MEAL.   NON FORMULARY Pt uses cpap nightly   phentermine (ADIPEX-P) 37.5 MG tablet Take 1 tablet (37.5 mg total) by mouth daily before breakfast.   polyethylene glycol powder (GLYCOLAX /MIRALAX ) 17 GM/SCOOP powder Take 1 capful (17 g) in water 2 (two) times daily.   potassium chloride  SA (KLOR-CON  M) 20 MEQ tablet TAKE 1 TABLET BY MOUTH EVERY DAY   pregabalin  (LYRICA ) 75 MG capsule Take 1 capsule (75 mg total) by mouth at bedtime as needed (nerve pain).   spironolactone  (ALDACTONE ) 25 MG tablet TAKE 1/2 TABLET BY MOUTH EVERY DAY   No facility-administered encounter medications on file as of 01/14/2024.     Objective:     PHYSICAL EXAMINATION:    VITALS:  There were no vitals filed for this visit.  GEN:  The patient appears stated age and is in NAD. HEENT:  Normocephalic, atraumatic.   Neurological examination:  General: NAD, well-groomed, appears stated age. Orientation: The patient is alert. Oriented to person, place and to date.*** Cranial nerves: There is good facial symmetry.anxious appearing.  The speech is fluent and clear tangential. No aphasia or dysarthria. Fund of knowledge is appropriate. Recent memory impaired and remote memory is normal.  Attention and concentration are normal.  Able to name objects and repeat phrases.  Hearing is intact to conversational tone ***.   Delayed recall *** Sensation: Sensation is intact to light touch throughout Motor: Strength is at least antigravity x4. DTR's 2/4 in UE/LE      03/14/2023  2:00 PM  Montreal Cognitive Assessment   Visuospatial/ Executive  (0/5) 5  Naming (0/3) 3  Attention: Read list of digits (0/2) 1  Attention: Read list of letters (0/1) 1  Attention: Serial 7 subtraction starting at 100 (0/3) 1  Language: Repeat phrase (0/2) 1  Language : Fluency (0/1) 0  Abstraction (0/2) 0  Delayed Recall (0/5) 3  Orientation (0/6) 5  Total 20  Adjusted Score (based on education) 20       07/14/2023    4:00 PM  MMSE - Mini Mental State Exam  Orientation to time 4  Orientation to Place 5  Registration 3  Attention/ Calculation 5  Recall 2  Language- name 2 objects 2  Language- repeat 1  Language- follow 3 step command 3  Language- read & follow direction 1  Write a sentence 1  Copy design 1  Total score 28       Movement examination: Tone: There is normal tone in the UE/LE Abnormal movements:  no tremor.  No myoclonus.  No asterixis.   Coordination:  There is no decremation with RAM's. Normal FTN  gait and Station: The patient has difficulty arising out of a deep-seated chair without the use of the hands. The patient's stride length is short, needs a walker.  Gait is cautious and narrow.   Thank you for allowing us  the opportunity to participate in the care of this nice patient. Please do not hesitate to contact us  for any questions or concerns.   Total time spent on today's visit was *** minutes dedicated to this patient today, preparing to see patient, examining the patient, ordering tests and/or medications and counseling the patient, documenting clinical information in the EHR or other health record, independently interpreting results and communicating results to the patient/family, discussing treatment and goals, answering patient's questions and coordinating care.  Cc:  Joshua Debby CROME, MD  Camie Sevin 01/13/2024 6:02 AM

## 2024-01-13 NOTE — Telephone Encounter (Signed)
 Patient called and stated that [facility] called and stated to her that they only received a referral for her shoulders and not for her legs or her knee which she states was the main concern. They need another referral sent and she would like us  to call when this is done. Please advise.

## 2024-01-14 ENCOUNTER — Ambulatory Visit: Admitting: Physician Assistant

## 2024-01-14 ENCOUNTER — Ambulatory Visit: Admitting: Internal Medicine

## 2024-01-14 DIAGNOSIS — I152 Hypertension secondary to endocrine disorders: Secondary | ICD-10-CM | POA: Diagnosis not present

## 2024-01-14 DIAGNOSIS — I4901 Ventricular fibrillation: Secondary | ICD-10-CM | POA: Diagnosis not present

## 2024-01-14 DIAGNOSIS — F333 Major depressive disorder, recurrent, severe with psychotic symptoms: Secondary | ICD-10-CM | POA: Diagnosis not present

## 2024-01-14 DIAGNOSIS — I5042 Chronic combined systolic (congestive) and diastolic (congestive) heart failure: Secondary | ICD-10-CM | POA: Diagnosis not present

## 2024-01-14 DIAGNOSIS — E1159 Type 2 diabetes mellitus with other circulatory complications: Secondary | ICD-10-CM | POA: Diagnosis not present

## 2024-01-14 DIAGNOSIS — T8149XD Infection following a procedure, other surgical site, subsequent encounter: Secondary | ICD-10-CM | POA: Diagnosis not present

## 2024-01-14 NOTE — Telephone Encounter (Signed)
 Amitiza was prescribed

## 2024-01-15 ENCOUNTER — Ambulatory Visit (INDEPENDENT_AMBULATORY_CARE_PROVIDER_SITE_OTHER): Admitting: Mental Health

## 2024-01-15 ENCOUNTER — Ambulatory Visit: Payer: Self-pay | Admitting: Internal Medicine

## 2024-01-15 ENCOUNTER — Other Ambulatory Visit: Payer: Self-pay | Admitting: Internal Medicine

## 2024-01-15 ENCOUNTER — Encounter: Payer: Self-pay | Admitting: Internal Medicine

## 2024-01-15 DIAGNOSIS — F332 Major depressive disorder, recurrent severe without psychotic features: Secondary | ICD-10-CM

## 2024-01-15 DIAGNOSIS — E60 Dietary zinc deficiency: Secondary | ICD-10-CM | POA: Insufficient documentation

## 2024-01-15 DIAGNOSIS — K5904 Chronic idiopathic constipation: Secondary | ICD-10-CM

## 2024-01-15 LAB — RETICULOCYTES
ABS Retic: 91290 {cells}/uL — ABNORMAL HIGH (ref 20000–80000)
Retic Ct Pct: 1.7 %

## 2024-01-15 LAB — ZINC: Zinc: 54 ug/dL — ABNORMAL LOW (ref 60–130)

## 2024-01-15 LAB — METHYLMALONIC ACID, SERUM: Methylmalonic Acid, Quant: 186 nmol/L (ref 55–335)

## 2024-01-15 MED ORDER — ZINC GLUCONATE 50 MG PO TABS: 50.0000 mg | ORAL_TABLET | Freq: Every day | ORAL | 1 refills | Status: AC

## 2024-01-15 MED ORDER — LINACLOTIDE 145 MCG PO CAPS
145.0000 ug | ORAL_CAPSULE | Freq: Every day | ORAL | 1 refills | Status: AC
Start: 2024-01-15 — End: ?

## 2024-01-15 NOTE — Progress Notes (Signed)
   THERAPIST PROGRESS NOTE  Session Time: 2:00pm ( 55 minutes)  Participation Level: Active  Behavioral Response: Casual and NeatAlertIrritable  Type of Therapy: Individual Therapy  Treatment Goals addressed:  STG: To cope. [name] will increase management of moods AEB development of x3 effective coping skills with ability to process thoughts and feelings in balanced manner within the next 90 days.   ProgressTowards Goals: Progressing  Interventions: CBT and Supportive  Summary: [name] is a 58 y.o. female who presents with dx of major depression recurrent severe.  Presents for session alert and oriented; mood and affect adequate; slightly irritable. Shares chief complaint  I am not doing so well. Shares thoughts on current state of political environment to be a factor, shares things are getting worse Shares concern for immigrants and with feelings of overwhelm. Shares difficulty in coping but reports enjoyment of pets and explored ability to obtain a pet and shares hx of fostering animals until she was able to find owner. Notes additional stressor with online use of social platforms and challenges experienced. Explores with therapist working to monitor online usage as well as things in which she can control.   Suicidal/Homicidal: Nowithout intent/plan  Therapist Response:  Therapist engaged [name] in therapy session. Assessed for current level of functioning, sxs management and current stressors. Provided safe space to share thoughts and feelings in regards to current stressors. Active empathic listening. Explores current concerns with Penda and factors contributing to low irritable moods. Provided support and encouragement; validated feelings. Supported in processing current challenges experiences and explored ability to set boundaries with self and exploring what is and is not within her control. Reviewed session and provided follow up   Plan: Return again in 3 weeks.  Diagnosis:  Major depressive disorder, recurrent episode, severe with anxious distress (HCC)  Collaboration of Care: Other None  Patient/Guardian was advised Release of Information must be obtained prior to any record release in order to collaborate their care with an outside provider. Patient/Guardian was advised if they have not already done so to contact the registration department to sign all necessary forms in order for us  to release information regarding their care.   Consent: Patient/Guardian gives verbal consent for treatment and assignment of benefits for services provided during this visit. Patient/Guardian expressed understanding and agreed to proceed.   Ty Asal [place], [facility] 01/15/2024

## 2024-01-16 ENCOUNTER — Encounter (HOSPITAL_COMMUNITY): Payer: Self-pay | Admitting: Physician Assistant

## 2024-01-16 ENCOUNTER — Ambulatory Visit (INDEPENDENT_AMBULATORY_CARE_PROVIDER_SITE_OTHER): Admitting: Physician Assistant

## 2024-01-16 VITALS — BP 114/82 | HR 85 | Temp 98.4°F | Ht 64.0 in | Wt 234.0 lb

## 2024-01-16 DIAGNOSIS — F332 Major depressive disorder, recurrent severe without psychotic features: Secondary | ICD-10-CM

## 2024-01-16 DIAGNOSIS — F419 Anxiety disorder, unspecified: Secondary | ICD-10-CM

## 2024-01-16 DIAGNOSIS — F988 Other specified behavioral and emotional disorders with onset usually occurring in childhood and adolescence: Secondary | ICD-10-CM

## 2024-01-16 NOTE — Progress Notes (Unsigned)
 [facility] MD/PA/NP OP Progress Note  01/16/2024 7:30 PM [name]  MRN:  [id number]  Chief Complaint:  Chief Complaint  Patient presents with   Establish Care   HPI: ***  [name] ***  Visit Diagnosis:    ICD-10-CM   1. Major depressive disorder, recurrent episode, severe with anxious distress (HCC)  F33.2     2. Anxiety  F41.9     3. 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   Past Medical History:  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/2023   ADD (attention deficit disorder)    Anxiety    Arthritis    both knees right worse than left   Carpal tunnel syndrome of right wrist    Chronic back pain    Dysrhythmia    history of Vtach   Enterococcus faecalis infection 04/29/2023   Essential hypertension, benign    Gallstones    History of smoking 06/22/2016   Hyperlipidemia associated with type 2 diabetes mellitus (HCC), on Zocor  04/04/2011   Hypertension associated with diabetes (HCC) 06/03/2008   MDD (major depressive disorder)    Memory impairment    post cardiac arrest   Migraines    Morbid obesity (HCC)    Morbid obesity with BMI of 50.0-59.9, adult (HCC) 07/12/2006   NICM (nonischemic cardiomyopathy) (HCC) 07/12/2006   01/16/18 ECHO:    - Procedure narrative: Transthoracic echocardiography. Image   quality was suboptimal. The study was technically difficult.   Intravenous contrast (Definity ) was administered. - Left ventricle: The cavity size was moderately dilated. Wall   thickness was increased in a pattern of mild LVH. Systolic   function was moderately to severely reduced. 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    Pseudomonas infection 04/29/2023   Spinal stenosis    back pain   Spondylosis without myelopathy or radiculopathy, lumbar region 12/04/2017   Type 2 diabetes mellitus with hyperglycemia (HCC)    Walker as ambulation aid     Past Surgical History:  Procedure Laterality Date   CHOLECYSTECTOMY     DILATATION & CURETTAGE/HYSTEROSCOPY WITH MYOSURE N/A 10/31/2017   Procedure: DILATATION & CURETTAGE/HYSTEROSCOPY WITH MYOSURE;  Surgeon: Jannis Kate Norris, MD;  Location: [facility];  Service: Gynecology;  Laterality: N/A;   HYSTEROSCOPY WITH D & C N/A 05/07/2022   Procedure: DILATATION AND CURETTAGE /HYSTEROSCOPY;  Surgeon: Jeralyn Crutch, MD;  Location: [facility] OR;  Service: Gynecology;  Laterality: N/A;   ICD IMPLANT N/A 11/25/2023   Procedure: ICD IMPLANT;  Surgeon: Inocencio Soyla Lunger, MD;  Location: [facility] INVASIVE CV LAB;  Service: Cardiovascular;  Laterality: N/A;   INCISION AND DRAINAGE OF WOUND Left 02/06/2023   Procedure: debridement of left leg wound, application of myriad;  Surgeon: Waddell Leonce NOVAK, MD;  Location: [facility] OR;  Service: Plastics;  Laterality: Left;   INCISION AND DRAINAGE OF WOUND Left 02/24/2023   Procedure: application of tissue replacement matrix of left patellar tendon;  Surgeon: Waddell Leonce NOVAK, MD;  Location: [facility] OR;  Service: Plastics;  Laterality: Left;   INCISION AND DRAINAGE OF WOUND Left 04/15/2023   Procedure: irrigation and debridement to left lower extremity wound;  Surgeon: Waddell Leonce NOVAK, MD;  Location: [facility] OR;  Service: Plastics;  Laterality: Left;   IRRIGATION AND DEBRIDEMENT KNEE  02/24/2023   Procedure: IRRIGATION AND DEBRIDEMENT KNEE;  Surgeon: Waddell Leonce NOVAK, MD;  Location: [facility] OR;  Service: Plastics;;   life vest     RIGHT/LEFT HEART CATH AND CORONARY ANGIOGRAPHY N/A 04/11/2016   Procedure: Right/Left Heart Cath and Coronary Angiography;  Surgeon: Lonni JONETTA Cash, MD;  Location: [facility] INVASIVE CV LAB;  Service: Cardiovascular;  Laterality: N/A;    RIGHT/LEFT HEART CATH AND CORONARY ANGIOGRAPHY N/A 12/09/2022   Procedure: RIGHT/LEFT HEART CATH AND CORONARY ANGIOGRAPHY;  Surgeon: Ladona Heinz, MD;  Location: [facility] INVASIVE CV LAB;  Service: Cardiovascular;  Laterality: N/A;   sonogram for blood clots     no blockages    Family Psychiatric History:  Patient reports that her mother had several psych issues including depression, anxiety, and possible OCD Father - OCD Son - Asperger's, ADD Grandmother (ma) - depression, anxiety. History of some form of electric/stimulation therapy 2 Uncles - depression, anxiety 2nd cousins - OCD, ADD, depression, anxiety  Family History:  Family History  Problem Relation Age of Onset   Depression Mother    Anxiety disorder Mother    Diabetes Mother    Hypertension Mother    ADD / ADHD Father    Diabetes Father    Hypertension Father    Colitis Father    Hypertension Other    Diabetes Other    Colitis Other    Alcohol abuse Other    Breast cancer Neg Hx    BRCA 1/2 Neg Hx     Social History:  Social History   Socioeconomic History   Marital status: Married    Spouse name: [name]   Number of children: 1   Years of education: 16   Highest education level: Associate degree: academic program  Occupational History   Not on file  Tobacco Use   Smoking status: Former    Current packs/day: 0.00    Types: Cigarettes    Quit date: 02/25/1997    Years since quitting: 26.9   Smokeless tobacco: Never   Tobacco comments:    Married, lives with spouse (when he is not traveling) and son  Vaping Use   Vaping status: Never Used  Substance and Sexual Activity   Alcohol use: No   Drug use: No   Sexual activity: Not Currently    Partners: Male    Birth control/protection: None  Other Topics Concern   Not on file  Social History Narrative   Right handed      Two story home   Lives with husband   No caffeine   Disablitiy    Social Drivers of Health   Financial Resource Strain: Low Risk   (02/10/2023)   Overall Financial Resource Strain (CARDIA)    Difficulty of Paying Living Expenses: Not hard at all  Food Insecurity: Food Insecurity Present (12/23/2023)   Hunger Vital Sign    Worried About Running Out of Food in the Last Year: Sometimes true    Ran Out of Food in the Last Year: Sometimes true  Transportation Needs: No Transportation Needs (08/26/2023)   PRAPARE - Administrator, Civil Service (Medical): No    Lack of Transportation (Non-Medical): No  Physical Activity: Inactive (11/04/2023)   Exercise Vital Sign    Days of Exercise per Week: 0 days    Minutes of Exercise per Session: 0 min  Stress: Stress  Concern Present (12/23/2023)   [employer] of Occupational Health - Occupational Stress Questionnaire    Feeling of Stress: Very much  Social Connections: Socially Isolated (02/10/2023)   Social Connection and Isolation Panel    Frequency of Communication with Friends and Family: Once a week    Frequency of Social Gatherings with Friends and Family: Once a week    Attends Religious Services: Never    Database Administrator or Organizations: No    Attends Banker Meetings: Never    Marital Status: Married    Allergies:  Allergies  Allergen Reactions   Fluoxetine Other (See Comments)    More depressed   Cymbalta [Duloxetine Hcl] Other (See Comments)    depressed   Porcine (Pork) Protein-Containing Drug Products Other (See Comments)    Religious beliefs- does not eat pork  Medications ok if necessary    Metabolic Disorder Labs: Lab Results  Component Value Date   HGBA1C 6.3 11/06/2023   MPG 142.72 12/07/2022   MPG 140 05/02/2022   No results found for: PROLACTIN Lab Results  Component Value Date   CHOL 156 01/08/2024   TRIG 69.0 01/08/2024   HDL 61.70 01/08/2024   CHOLHDL 3 01/08/2024   VLDL 13.8 01/08/2024   LDLCALC 81 01/08/2024   LDLCALC 117 (H) 08/13/2022   Lab Results  Component Value Date   TSH 2.26  01/08/2024   TSH 4.29 01/15/2023    Therapeutic Level Labs: No results found for: LITHIUM No results found for: VALPROATE No results found for: CBMZ  Current Medications: Current Outpatient Medications  Medication Sig Dispense Refill   ACCU-CHEK GUIDE TEST test strip TEST BLOOD SUGARS MORNING, NOON, AND AT BEDTIME 100 strip 11   Accu-Chek Softclix Lancets lancets Use as instructed 100 each 12   AMBULATORY NON FORMULARY MEDICATION Off-loading wheel chair seat cusion Dispense 1 Dx code: M53.3 Use as needed 1 Units 0   atorvastatin  (LIPITOR ) 80 MG tablet TAKE 1 TABLET BY MOUTH EVERY DAY 90 tablet 0   B Complex-C (B-COMPLEX WITH VITAMIN C ) tablet Take 1 tablet by mouth daily.     Blood Glucose Monitoring Suppl DEVI 1 each by Does not apply route in the morning, at noon, and at bedtime. May substitute to any manufacturer covered by patient's insurance. 1 each 0   Cholecalciferol (VITAMIN D3) 50 MCG (2000 UT) capsule Take 1 capsule (2,000 Units total) by mouth daily.     clotrimazole -betamethasone  (LOTRISONE ) cream Apply 1 Application topically 2 (two) times daily. 60 g 0   collagenase  (SANTYL ) 250 UNIT/GM ointment Apply 1 Application topically daily.     dapagliflozin  propanediol (FARXIGA ) 10 MG TABS tablet TAKE 1 TABLET BY MOUTH EVERY DAY 90 tablet 3   fluconazole  (DIFLUCAN ) 100 MG tablet Take 2 tabs on day#1, then 1 tab daily on Days #2-7 8 tablet 1   furosemide  (LASIX ) 40 MG tablet Take 1 tablet (40 mg total) by mouth daily. 90 tablet 3   gentamicin  ointment (GARAMYCIN ) 0.1 % Apply 1 Application topically at bedtime.     hydrOXYzine  (ATARAX ) 25 MG tablet TAKE 1 TABLET BY MOUTH THREE TIMES A DAY AS NEEDED 270 tablet 0   linaclotide  (LINZESS ) 145 MCG CAPS capsule Take 1 capsule (145 mcg total) by mouth daily before breakfast. 90 capsule 1   losartan  (COZAAR ) 25 MG tablet Take 0.5 tablets (12.5 mg total) by mouth daily.     magnesium  oxide (MAG-OX) 400 MG tablet Take 1 tablet (400 mg  total) by  mouth at bedtime. 90 tablet 1   meclizine  (ANTIVERT ) 12.5 MG tablet Take 1 tablet (12.5 mg total) by mouth 3 (three) times daily as needed. 40 tablet 1   metFORMIN  (GLUCOPHAGE -XR) 500 MG 24 hr tablet TAKE 1 TABLET BY MOUTH 2 TIMES DAILY WITH A MEAL. 180 tablet 1   metoprolol  succinate (TOPROL -XL) 50 MG 24 hr tablet TAKE 1 TABLET BY MOUTH DAILY. TAKE WITH OR IMMEDIATELY FOLLOWING A MEAL. 90 tablet 3   NON FORMULARY Pt uses cpap nightly     phentermine  (ADIPEX-P ) 37.5 MG tablet Take 1 tablet (37.5 mg total) by mouth daily before breakfast. 90 tablet 0   polyethylene glycol powder (GLYCOLAX /MIRALAX ) 17 GM/SCOOP powder Take 1 capful (17 g) in water 2 (two) times daily. 765 g 0   potassium chloride  SA (KLOR-CON  M) 20 MEQ tablet TAKE 1 TABLET BY MOUTH EVERY DAY 90 tablet 3   pregabalin  (LYRICA ) 75 MG capsule Take 1 capsule (75 mg total) by mouth at bedtime as needed (nerve pain). 30 capsule 3   spironolactone  (ALDACTONE ) 25 MG tablet TAKE 1/2 TABLET BY MOUTH EVERY DAY 45 tablet 2   zinc  gluconate 50 MG tablet Take 1 tablet (50 mg total) by mouth daily. 90 tablet 1   No current facility-administered medications for this visit.     Musculoskeletal: Strength & Muscle Tone: within normal limits Gait & Station: unsteady, patient ambulates with a cane Patient leans: Left  Psychiatric Specialty Exam: Review of Systems  Psychiatric/Behavioral:  Positive for decreased concentration, dysphoric mood and sleep disturbance. Negative for hallucinations, self-injury and suicidal ideas. The patient is nervous/anxious. The patient is not hyperactive.     Blood pressure 114/82, pulse 85, temperature 98.4 F (36.9 C), temperature source Oral, height 5' 4 (1.626 m), weight 234 lb (106.1 kg), SpO2 97%.Body mass index is 40.17 kg/m.  General Appearance: Casual  Eye Contact:  Good  Speech:  Clear and Coherent and Normal Rate  Volume:  Normal  Mood:  Anxious and Depressed  Affect:  Congruent  Thought  Process:  Coherent, Goal Directed, and Descriptions of Associations: Tangential  Orientation:  Full (Time, Place, and Person)  Thought Content: WDL   Suicidal Thoughts:  No  Homicidal Thoughts:  No  Memory:  Immediate;   Fair Recent;   Fair Remote;   Fair  Judgement:  Fair  Insight:  Lacking  Psychomotor Activity:  Normal  Concentration:  Concentration: Fair and Attention Span: Fair  Recall:  [employer] of Knowledge: Good  Language: Good  Akathisia:  No  Handed:  Right  AIMS (if indicated): not done  Assets:  Communication Skills Desire for Improvement Financial Resources/Insurance Housing Intimacy Social Support Transportation  ADL's:  Intact  Cognition: WNL  Sleep:  Fair   Screenings: AUDIT    Flowsheet Row [facility]  Alcohol Use Disorder Identification Test Final Score (AUDIT) 0   GAD-7    Flowsheet Row Clinical Support from 01/16/2024 in [facility] Patient Outreach Telephone from 12/23/2023 in [facility] POPULATION [facility]ALTH DEPART[facility] Office Visit from 12/19/2023 in [facility] Patient Outreach Telephone from 11/04/2023 in [place] POPULATION HEALTH DEPARTMENT Patient Outreach Telephone from 09/29/2023 in [place] POPULATION HEALTH DEPARTMENT  Total GAD-7 Score 18 17 18 8 8    Mini-Mental    Flowsheet Row Office Visit from 07/14/2023 in [facility]  Total Score (max 30 points ) 28  PHQ2-9    Flowsheet Row Clinical Support from 01/16/2024 in [facility] Patient Outreach Telephone from 12/23/2023 in [place] POPULATION HEALTH DEPARTMENT Office Visit from 12/19/2023 in [facility] [place] [facility] Patient Outreach Telephone from 11/04/2023 in [place] POPULATION HEALTH DEPARTMENT Patient Outreach Telephone from 09/29/2023 in [place] POPULATION HEALTH  DEPARTMENT  PHQ-2 Total Score 6 6 6 2 2   PHQ-9 Total Score 21 13 19 10 9    Flowsheet Row Clinical Support from 01/16/2024 in [facility] Admission (Discharged) from 11/25/2023 in [facility] CARDIAC CATH LAB ED from 05/21/2023 in [facility] Emergency Department at [facility]  C-SSRS RISK CATEGORY Moderate Risk No Risk No Risk     Assessment and Plan: ***  ***  Collaboration of Care: Collaboration of Care: Primary Care Provider AEB provider being seen by a primary care provider (internal medicine), Psychiatrist AEB patient being seen by a mental health provider at this facility, Other provider involved in patient's care AEB patient being seen by cardiology and rehabilitation, and Referral or follow-up with counselor/therapist AEB patient being seen by a licensed clinical social worker at this facility  Patient/Guardian was advised Release of Information must be obtained prior to any record release in order to collaborate their care with an outside provider. Patient/Guardian was advised if they have not already done so to contact the registration department to sign all necessary forms in order for us  to release information regarding their care.   Consent: Patient/Guardian gives verbal consent for treatment and assignment of benefits for services provided during this visit. Patient/Guardian expressed understanding and agreed to proceed.   1. Major depressive disorder, recurrent episode, severe with anxious distress (HCC) (Primary) ***  2. Anxiety ***  3. ADD (attention deficit disorder) without hyperactivity ***  Patient to follow-up in 7 weeks Provider spent a total of 60+ minutes with the patient/reviewing patient's chart  Reginia FORBES Bolster, PA 01/16/2024, 7:30 PM

## 2024-01-19 ENCOUNTER — Other Ambulatory Visit

## 2024-01-19 DIAGNOSIS — E1165 Type 2 diabetes mellitus with hyperglycemia: Secondary | ICD-10-CM

## 2024-01-19 MED ORDER — TIRZEPATIDE 2.5 MG/0.5ML ~~LOC~~ SOAJ: 2.5000 mg | SUBCUTANEOUS | 0 refills | Status: DC

## 2024-01-19 NOTE — Progress Notes (Signed)
 01/19/2024 Name: [name] MRN: [id number] DOB: Jan 03, 1966  Chief Complaint  Patient presents with   Medication Management    [name] is a 58 y.o. year old female who presented for a telephone visit.   They were referred to the pharmacist by their PCP for assistance in managing complex medication management.   Subjective:  Care Team: Primary Care Provider: Joshua Debby CROME, MD ; Next Scheduled Visit: none scheduled   Medication Access/Adherence  Current Pharmacy:  [id number] [address], [place] [phone] [place] [address] Phone: [phone] Fax: [phone]   Patient reports affordability concerns with their medications: Yes  Patient reports access/transportation concerns to their pharmacy: No  Patient reports adherence concerns with their medications:  No    Referral was for constipation med access but pt has several concerns including weight loss medication,   Diabetes:  Current medications: metformin  XR 500 mg BID, Farxiga  10 mg daily (for HF) Medications tried in the past: Mounjaro   Pt is wanting to go back on GLP-1 because she had success on it before with helping her lose weight. She reports food noise that was improved with Mounjaro  and notes that is currently her issue.  She has lost over 100 lbs in the past year since 11/2022 cardiac arrest hospitalization  Macrovascular and Microvascular Risk Reduction:  Statin? yes (atorvastatin  80 mg); ACEi/ARB? yes (losartan ) Last urinary albumin/creatinine ratio:  Lab Results  Component Value Date   MICRALBCREAT 8.8 05/22/2023   Last eye exam:  Lab Results  Component Value Date   HMDIABEYEEXA No Retinopathy 08/09/2022   Last foot exam: 05/22/2023 Tobacco Use:  Tobacco Use: Medium Risk (01/16/2024)   Patient History    Smoking Tobacco Use: Former    Smokeless Tobacco Use: Never    Passive Exposure: Not on file   Constipation: Amitiza  was >$30 with her  insurance Linzess  was sent after, but likely is more expensive or not covered  Pain: Previously saw pain management at [facility] but reports the provider called her a drug addict and did not go back   Objective:  Lab Results  Component Value Date   HGBA1C 6.3 11/06/2023    Lab Results  Component Value Date   CREATININE 1.02 11/06/2023   BUN 28 (H) 11/06/2023   NA 138 11/06/2023   K 4.1 11/06/2023   CL 100 11/06/2023   CO2 28 11/06/2023    Lab Results  Component Value Date   CHOL 156 01/08/2024   HDL 61.70 01/08/2024   LDLCALC 81 01/08/2024   LDLDIRECT 143.8 04/03/2011   TRIG 69.0 01/08/2024   CHOLHDL 3 01/08/2024    Medications Reviewed Today   Medications were not reviewed in this encounter       Assessment/Plan:   Diabetes: - Currently controlled; goal A1c <7%. Cardiorenal risk reduction is optimized.. Blood pressure is at goal <130/80. LDL is at goal.  - Recommend stop metformin  and start Mounjaro  2.5 mg weekly  Constipation: Contact pharmacy regarding Amitiza  price - [facility]. Amitiza  was denied by insurance due to being nonformulary. They prefer Linzess , which was sent already and went through for $0 copay. Pt informed via MyChart message.  Pain: Try APAP 8 hour 650 mg 1-2 tablets TID, max 4 tablets per day Will see if referral to [facility] pain management is an option   Follow Up Plan: 3-4 weeks  Darrelyn Drum, PharmD, BCPS, CPP Clinical Pharmacist Practitioner [facility] [phone]

## 2024-01-20 ENCOUNTER — Other Ambulatory Visit: Payer: Self-pay | Admitting: Internal Medicine

## 2024-01-20 DIAGNOSIS — I4901 Ventricular fibrillation: Secondary | ICD-10-CM | POA: Diagnosis not present

## 2024-01-20 DIAGNOSIS — F333 Major depressive disorder, recurrent, severe with psychotic symptoms: Secondary | ICD-10-CM | POA: Diagnosis not present

## 2024-01-20 DIAGNOSIS — I152 Hypertension secondary to endocrine disorders: Secondary | ICD-10-CM | POA: Diagnosis not present

## 2024-01-20 DIAGNOSIS — T8149XD Infection following a procedure, other surgical site, subsequent encounter: Secondary | ICD-10-CM | POA: Diagnosis not present

## 2024-01-20 DIAGNOSIS — I5042 Chronic combined systolic (congestive) and diastolic (congestive) heart failure: Secondary | ICD-10-CM | POA: Diagnosis not present

## 2024-01-20 DIAGNOSIS — E1159 Type 2 diabetes mellitus with other circulatory complications: Secondary | ICD-10-CM | POA: Diagnosis not present

## 2024-01-23 ENCOUNTER — Other Ambulatory Visit: Payer: Self-pay | Admitting: Internal Medicine

## 2024-01-23 DIAGNOSIS — E1165 Type 2 diabetes mellitus with hyperglycemia: Secondary | ICD-10-CM

## 2024-01-26 ENCOUNTER — Ambulatory Visit (HOSPITAL_COMMUNITY)
Admission: RE | Admit: 2024-01-26 | Discharge: 2024-01-26 | Disposition: A | Discharge status: Home / Self Care | Source: Ambulatory Visit | Attending: Cardiovascular Disease | Admitting: Cardiovascular Disease

## 2024-01-26 DIAGNOSIS — I428 Other cardiomyopathies: Secondary | ICD-10-CM | POA: Diagnosis not present

## 2024-01-26 LAB — ECHOCARDIOGRAM COMPLETE
Area-P 1/2: 5.63 cm2
S' Lateral: 4.4 cm

## 2024-01-27 ENCOUNTER — Encounter (HOSPITAL_BASED_OUTPATIENT_CLINIC_OR_DEPARTMENT_OTHER): Admitting: Internal Medicine

## 2024-01-27 DIAGNOSIS — T8149XD Infection following a procedure, other surgical site, subsequent encounter: Secondary | ICD-10-CM | POA: Diagnosis not present

## 2024-01-27 DIAGNOSIS — F333 Major depressive disorder, recurrent, severe with psychotic symptoms: Secondary | ICD-10-CM | POA: Diagnosis not present

## 2024-01-27 DIAGNOSIS — I152 Hypertension secondary to endocrine disorders: Secondary | ICD-10-CM | POA: Diagnosis not present

## 2024-01-27 DIAGNOSIS — E1159 Type 2 diabetes mellitus with other circulatory complications: Secondary | ICD-10-CM | POA: Diagnosis not present

## 2024-01-27 DIAGNOSIS — I5042 Chronic combined systolic (congestive) and diastolic (congestive) heart failure: Secondary | ICD-10-CM | POA: Diagnosis not present

## 2024-01-27 DIAGNOSIS — I4901 Ventricular fibrillation: Secondary | ICD-10-CM | POA: Diagnosis not present

## 2024-01-28 ENCOUNTER — Encounter (HOSPITAL_BASED_OUTPATIENT_CLINIC_OR_DEPARTMENT_OTHER): Attending: Internal Medicine | Admitting: Internal Medicine

## 2024-01-28 DIAGNOSIS — Y838 Other surgical procedures as the cause of abnormal reaction of the patient, or of later complication, without mention of misadventure at the time of the procedure: Secondary | ICD-10-CM | POA: Diagnosis not present

## 2024-01-28 DIAGNOSIS — T8140XA Infection following a procedure, unspecified, initial encounter: Secondary | ICD-10-CM | POA: Insufficient documentation

## 2024-01-28 DIAGNOSIS — Z8674 Personal history of sudden cardiac arrest: Secondary | ICD-10-CM | POA: Diagnosis not present

## 2024-01-28 DIAGNOSIS — L97828 Non-pressure chronic ulcer of other part of left lower leg with other specified severity: Secondary | ICD-10-CM | POA: Diagnosis not present

## 2024-01-28 DIAGNOSIS — T798XXA Other early complications of trauma, initial encounter: Secondary | ICD-10-CM | POA: Insufficient documentation

## 2024-01-29 ENCOUNTER — Other Ambulatory Visit: Payer: Self-pay | Admitting: Internal Medicine

## 2024-01-29 ENCOUNTER — Ambulatory Visit (HOSPITAL_COMMUNITY): Admitting: Mental Health

## 2024-01-29 DIAGNOSIS — F332 Major depressive disorder, recurrent severe without psychotic features: Secondary | ICD-10-CM

## 2024-01-29 DIAGNOSIS — E785 Hyperlipidemia, unspecified: Secondary | ICD-10-CM

## 2024-01-29 NOTE — Progress Notes (Unsigned)
   THERAPIST PROGRESS NOTE  Session Time: 2:03 am ( 57 minutes)  Participation Level: Active  Behavioral Response: CasualAlertIrritable  Type of Therapy: Individual Therapy  Treatment Goals addressed:  STG: To cope. [name] will increase management of moods AEB development of x3 effective coping skills with ability to process thoughts and feelings in balanced manner within the next 90 days. ProgressTowards Goals: Progressing  Interventions: Supportive  Summary: [name] is a 58 y.o. female who presents with dx of major depression recurrent severe.  Presents for session alert and oriented; mood and affect adequate; slightly irritable. Shares chief complaint  I am not doing so well. Shares feelings of overwhelm and shares for house to be in disarray alike to a home of a hoarding but denies hoarding behavior. Shares to have accumulation of items for sale but difficulty in maintaining organization. Notes barriers in place and high feelings of frustration with husband at times. Shares presence of emotional eating and will continue to look for food after feelings of fullness and reports to lack additional coping for emotions and frustrations. Shares ongoing feelings of guilt with relationship with father however states need to set boundaries to protect emotional and mental state. Notes thoughts on belief of ADHD medication to make a great difference for her but understands the concern for heart issues.   Suicidal/Homicidal: Nowithout intent/plan  Therapist Response: Therapist engaged [name] in therapy session. Assessed for current level of functioning, sxs management and current stressors. Provided safe space to share thoughts and feelings in regards to current stressors. Active empathic listening. Explores current concerns with [name] and factors contributing irritable agitated mood. Provided support and encouraged; validated feelings. Engaged [name] in exploring behavioral changes to support in  increase management of moods and reduction in strong emotions. Explored identification of coping skills. Reviewed concern of ADHD medication and heart concerns and encouraged to explore alternatives as well with provider. Reviewed session and provided follow up  Plan: Return again in x6 weeks.  Diagnosis: Major depressive disorder, recurrent episode, severe with anxious distress (HCC)  Collaboration of Care: Other None  Patient/Guardian was advised Release of Information must be obtained prior to any record release in order to collaborate their care with an outside provider. Patient/Guardian was advised if they have not already done so to contact the registration department to sign all necessary forms in order for us  to release information regarding their care.   Consent: Patient/Guardian gives verbal consent for treatment and assignment of benefits for services provided during this visit. Patient/Guardian expressed understanding and agreed to proceed.   Ty Asal [place], [facility] 01/29/2024

## 2024-01-30 ENCOUNTER — Ambulatory Visit: Payer: Self-pay | Admitting: Physician Assistant

## 2024-02-02 ENCOUNTER — Other Ambulatory Visit: Payer: Self-pay | Admitting: Licensed Clinical Social Worker

## 2024-02-02 ENCOUNTER — Ambulatory Visit: Payer: Self-pay

## 2024-02-02 ENCOUNTER — Ambulatory Visit: Attending: Internal Medicine

## 2024-02-02 ENCOUNTER — Telehealth: Payer: Self-pay | Admitting: Family Medicine

## 2024-02-02 NOTE — Telephone Encounter (Signed)
 Patient called and recently she was here and you discussed that a request was being put in for PT. She has that set up and she also has a nurse that comes to house once a week and she mentioned she would start going to [facility] and she said if she starts going there that she will have to terminate her weekly nurse due to insurance. She just got off the phone with social worker and maybe Dr. Joane needs to send a letter to medicaid to get some sort of approval to have both. Otherwise she will have to make a choice between the two. She is losing a lot of muscle and does not know what to do because she still has the open wound that the nurse works with her on at home. She would like to talk with someone. Please advise.

## 2024-02-02 NOTE — Telephone Encounter (Signed)
 Unable to reach Social worker [place] at [phone]; routing to the clinic for Follow-up            Message from Franky GRADE sent at 02/02/2024 11:49 AM EST  Reason for Triage: [facility] a child psychotherapist with population health at [facility] is calling to speak with a nurse regarding concerns he discussed with patient today, Did not disclose as it is very complicated but did mention memory concerns. He states patient is very talkative and would like to speak to the nurse before we reach out to the patient.

## 2024-02-02 NOTE — Telephone Encounter (Signed)
 Forwarding to Dr. Denyse Amass to review and advise.

## 2024-02-02 NOTE — Patient Outreach (Signed)
 Complex Care Management   Visit Note  02/02/2024  Name:  [name] MRN: [id number] DOB: September 25, 1965  Situation: Referral received for Complex Care Management related to mental health needs I obtained verbal consent from Patient.  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   ADD (attention deficit disorder)    Anxiety    Arthritis    both knees right worse than left   Carpal tunnel syndrome of right wrist    Chronic back pain    Dysrhythmia    history of Vtach   Enterococcus faecalis infection 04/29/2023   Essential hypertension, benign    Gallstones    History of smoking 06/22/2016   Hyperlipidemia associated with type 2 diabetes mellitus (HCC), on Zocor  04/04/2011   Hypertension associated with diabetes (HCC) 06/03/2008   MDD (major depressive disorder)    Memory impairment    post cardiac arrest   Migraines    Morbid obesity (HCC)    Morbid obesity with BMI of 50.0-59.9, adult (HCC) 07/12/2006   NICM (nonischemic cardiomyopathy) (HCC) 07/12/2006   01/16/18 ECHO:    - Procedure narrative: Transthoracic echocardiography. Image   quality was suboptimal. The study was technically difficult.   Intravenous contrast (Definity ) was administered. - Left ventricle: The cavity size was moderately dilated. Wall   thickness was increased in a pattern of mild LVH. Systolic   function was moderately to severely reduced. 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   Pseudomonas infection 04/29/2023   Spinal stenosis    back pain   Spondylosis without myelopathy or radiculopathy, lumbar region 12/04/2017   Type 2 diabetes mellitus with hyperglycemia (HCC)    Walker as ambulation aid     Assessment: Patient Reported Symptoms:  Cognitive Cognitive Status:  Alert and oriented to person, place, and time, Difficulties with attention and concentration Cognitive/Intellectual Conditions Management [RPT]: None reported or documented in medical history or problem list   Health Maintenance Behaviors: Stress management Health Facilitated by: Stress management  Neurological Neurological Review of Symptoms: Dizziness, Headaches, Weakness Neurological Management Strategies: Coping strategies  HEENT HEENT Symptoms Reported: No symptoms reported HEENT Management Strategies: Coping strategies    Cardiovascular Cardiovascular Symptoms Reported: Dizziness, Fatigue Cardiovascular Management Strategies: Coping strategies  Respiratory Respiratory Symptoms Reported: Shortness of breath Other Respiratory Symptoms: fatiques when walking Respiratory Management Strategies: Coping strategies  Endocrine Endocrine Symptoms Reported: Blurry vision, Weakness or fatigue    Gastrointestinal Gastrointestinal Symptoms Reported: No symptoms reported Gastrointestinal Management Strategies: Coping strategies    Genitourinary Genitourinary Symptoms Reported: Frequency Additional Genitourinary Details: frequent urination Genitourinary Management Strategies: Adequate rest, Coping strategies  Integumentary Integumentary Symptoms Reported: Skin changes Additional Integumentary Details: needs to monitor skin condition ; has weekly nurse visit in the home Skin Management Strategies: Coping strategies  Musculoskeletal Musculoskelatal Symptoms Reviewed: Limited mobility, Unsteady gait, Weakness Musculoskeletal Management Strategies: Coping strategies      Psychosocial Psychosocial Symptoms Reported: Sadness - if selected complete PHQ 2-9, Anxiety - if selected complete GAD, Depression - if selected complete PHQ 2-9 Behavioral Management Strategies: Coping strategies Major Change/Loss/Stressor/Fears (CP): Medical condition, self Techniques to Cope with Loss/Stress/Change:  Counseling, Diversional activities Quality of Family Relationships: supportive Do you feel physically threatened by others?: No    02/02/2024  PHQ2-9 Depression Screening   Little interest or pleasure in doing things More than half the days  Feeling down, depressed, or hopeless More than half the days  PHQ-2 - Total Score 4  Trouble falling or staying asleep, or sleeping too much More than half the days  Feeling tired or having little energy More than half the days  Poor appetite or overeating  More than half the days  Feeling bad about yourself - or that you are a failure or have let yourself or your family down More than half the days  Trouble concentrating on things, such as reading the newspaper or watching television More than half the days  Moving or speaking so slowly that other people could have noticed.  Or the opposite - being so fidgety or restless that you have been moving around a lot more than usual Several days  Thoughts that you would be better off dead, or hurting yourself in some way Not at all  PHQ2-9 Total Score 15  If you checked off any problems, how difficult have these problems made it for you to do your work, take care of things at home, or get along with other people Somewhat difficult  Depression Interventions/Treatment Medication, Counseling    There were no vitals filed for this visit. Pain Scale: 0-10 Pain Score: 7  Pain Type: Chronic pain Pain Location: Generalized Pain Descriptors / Indicators: Aching Patients Stated Pain Goal: 1 Pain Intervention(s): Relaxation  Medications Reviewed Today   Medications were not reviewed in this encounter     Recommendation:   PCP Follow-up Continue Current Plan of Care Take medications as prescribed Attend scheduled mental health appointments Call LCSW as needed for SW support   Follow Up Plan:   Telephone follow up appointment date/time:  03/15/2024 at 10:00 AM   [name]  MSW, LCSW [facility] Licensed Clinical Social Worker Direct Dial:  [phone] Fax:  [phone] Website:  [email or web address]

## 2024-02-02 NOTE — Patient Instructions (Signed)
 Visit Information  Thank you for taking time to visit with me today. Please don't hesitate to contact me if I can be of assistance to you before our next scheduled appointment.  Our next appointment is by telephone on 03/15/2024 at 10:00 AM   Please call the care guide team at [phone] if you need to cancel or reschedule your appointment.   Following is a copy of your care plan:   Goals Addressed             This Visit's Progress    VBCI Social Work Care Plan       Problems:   Mobility issues  Uses a walker or a cane as needed to help her walk. Physical therapy sessions for client have ended            Skin care issues (wound care issue)             Anxiety issues             Mental health  needs (sees counselor at [facility]             Depression issues             Some financial challenges              Memory issues              ADD              Functional challenges                          CSW Clinical Goal(s):   Over the next 30  days the Patient will attend all scheduled Behavioral Health Counseling Appointments as scheduled to help with stress and anxiety issues AEB patient report of attending Behavioral Health appointments scheduled.              Over the next 30 days, client will use coping skills to manage depression issues faced AEB client report of reduction in depression symptoms.  Interventions:  Discussed client pain issues. Takes pain medication as prescribed Client said she had procedure for ICD implant in September of 2025             Discussed client transport needs. Client receives transport help from her spouse           Client is concerned about memory loss. She feels she needs more soupport medically.  Spouse of client is supportive.            Discussed skin care needs of client. Discussed nurse support for client in the home.  Nurse from [place] visits client in home one time weekly.              Client has sleep difficulty. She  spoke of neuropathy issues.                Client spoke of diurectic use. She spoke of frequency of bathroom visits               Discussed ambulation of client. She uses a cane or walker as needed. She has pain sometimes when walking                Discussed wound care of client. She has wound care for wound site on left leg (slow healing wound). She said she changes dressing daily on wound site  Discussed counseling support of client at mental health agency. She sees counselor one time per month.at [facility]                Client was anxious and speaking very fast. She is worried about Food stamps application. She is communicating with PCP office to seek support  as needed.                   Encouraged client to call LCSW as needed for SW support               Encouraged client to talk with RN at PCP office about her current symptoms faced               Client spoke in rapid pace, She jumped from topic to topic. She spoke of ADHD. She spoke of past use of Adderal. She is concerned about her memory, her recall. She said she has to write down information since she has memory challenges She said that her spouse helps her with memory issues and staying on schedule.  Her son is helping her currently with client needs. Client said that her spouse helps her with medication administration                Client said she likes to sit in corner in restaurant. She is concerned about Asperger's like symptoms . Unsure of diagnosis needs of herself.             Client spoke almost non  stop for minutes at a time, jumping from topic to topic.                LCSW used Active Listening techniques to hear client needs and allow her to share her feelings.                 Discussed client work with veterinary surgeon.  Discussed suggestions from counselor for client. Client said she sees counselor one time per month               Client was tearful during call and emotional about her current  needs. She feels that her body is changing and she needs medical guidance on her current plan of care                Client said she has had some support from neurologist. Client said she thinks she has follow up appointment with neurologist February 23 of 2026.                 Client is concerned about neuropathy issues.                Encouraged client to call LCSW as needed for SW support.                            Patient Goals/Self-Care Activities:  Client to attend scheduled medical appointments             Client to take medications as prescribed              Client to use coping skills to manage depression issues faced              Client to call LCSW as needed for SW support              Client to participate in scheduled mental health appointments  Client to communicate with RN at PCP office about current symptoms of client              Plan:   LCSW to call client on 03/15/2024 at 10:00 AM          Please go to [facility] [address], [place] [phone]) if you are experiencing a Mental Health or Behavioral Health Crisis or need someone to talk to.  Patient verbalized understanding of Care plan and visit instructions communicated this visit   [name]  MSW, LCSW [place]/Value Based Care [facility] Licensed Clinical Social Worker Direct Dial:  [phone] Fax:  [phone] Website:  [email or web address]

## 2024-02-03 ENCOUNTER — Ambulatory Visit: Payer: Self-pay

## 2024-02-03 ENCOUNTER — Telehealth: Payer: Self-pay | Admitting: *Deleted

## 2024-02-03 NOTE — Telephone Encounter (Signed)
 Georjean will worsen her constipation

## 2024-02-03 NOTE — Telephone Encounter (Signed)
 Please advise

## 2024-02-03 NOTE — Telephone Encounter (Signed)
 I don't think a stimulant is safe

## 2024-02-03 NOTE — Telephone Encounter (Signed)
 I think that is correct.  Typically if you can get to physical therapy or not can get home health services.  Which do you think is the biggest priority for now?

## 2024-02-03 NOTE — Telephone Encounter (Signed)
 Pt reports that her device site is still very sore/sensitive.  Aware we can further assess at her upcoming 90 day post implant follow up later this month.  She told me that Dr. Lavona called her a couple of days ago and told her she could restart her Adderall.

## 2024-02-03 NOTE — Telephone Encounter (Signed)
 Spoke with Glendia and he stated that he spoke with a nurse name Nat today about Elena for about 15 mins this morning and she would reach out to Maelani. He stated that Everything has been taken care of.

## 2024-02-03 NOTE — Telephone Encounter (Signed)
-----   Message -----  From: Leander Keen D  Sent: 01/26/2024   2:25 PM EST  To: Lynwood Schilling, MD; Will [name], MD  Subject: Patient Concerns                               Patient has a couple questions and requests Phone call to discuss.  She has been having continued pain/soreness at ICD incision site and is anxious if this is normal.  Also, she has been off of her ADD medication due to antibiotics- following surgery (for about the last year).  She is hoping to go back on her adderall, but has some questions about this.    Please Advise ASAP.   Thanks,  Nike, RDCS

## 2024-02-03 NOTE — Telephone Encounter (Signed)
 FYI Only or Action Required?: Action required by provider: clinical question for provider, update on patient condition, and requests for information for [facility].See protocol initial assessment questions for details of multiple concerns   Patient was last seen in primary care on 01/08/2024 by Joshua Debby CROME, MD.  Called Nurse Triage reporting No chief complaint on file..Memory loss, add, neuropathy, requesting info  Symptoms began chronic.  Interventions attempted: Other: see protocol details.  Symptoms are: gradually worsening.  Triage Disposition: Call PCP Within 24 Hours  Patient/caregiver understands and will follow disposition?: No, wishes to speak with PCP   Copied from CRM #[id number]. Topic: Clinical - Pink Word Triage >> Feb 02, 2024 11:46 AM Franky GRADE wrote: [name] Word triggered transfer to Nurse Triage. See Triage Message for details. >> Feb 03, 2024  8:45 AM Adelita E wrote: [name], CSW with [facility], is returning a call from nurse triage. Will reach out to NT to get Scott connected with a nurse. >> Feb 02, 2024 11:49 AM Franky GRADE wrote: Reason for Triage: [facility] a social worker with population health at [facility] is calling to speak with a nurse regarding concerns he discussed with patient today, Did not disclose as it is very complicated but did mention memory concerns. He states patient is very talkative and would like to speak to the nurse before we reach out to the patient. Reason for Disposition  [1] Follow-up call from patient regarding patient's clinical status AND [2] information NON-URGENT  Answer Assessment - Initial Assessment Questions REASON FOR CALL or QUESTION: What is your reason for calling today? or How can I best     Concern for mental health-social worker requesting call to patient for triage of mental health and multiple concerns she reported during their routine check in call yesterday.                                                                      CALLER: Document the source of call. (e.g., laboratory staff, caregiver or patient).     Scott Forrest, LCSW-[place]. Working with patient for one year.   Information provided by LCSW: 1) [name], LCSW-[place]. Routine call yesterday with patient, patient has multiple concerns to discuss. Glendia reports she talks a lot at baseline but  is concerned with her hyperverbal rapid speech pattern and flight of thought during call yesterday. She seems to be declining in mentation.   2) She reported to [place] that she is concerned with declining memory issues, she is needing to write everything down after phone calls or she will forget the conversation. She has longstanding memory issue but Glendia feels her memory is declining. [name] LCSW call back number [phone]  3) Patient Concern Neuropathy-see report from patient below  4) [facility]-skin checks. They told her if she has PT at [facility] she would not receive in home care for skin checks and vital.  Fed up with in home PT.  5) Patient not happy with counselor feels she doesn't they don't provide assistance that is needed. See report from patient below.   6) Patient concern ADD. she is concerned and would like to discuss adderall. See report from patient below.   7) Patient requesting information:  [place] contract was [employer] she wants to know the name who took over [place] [facility] for [place].  Would like husband to be her home care giver.    Information provided by patient: Neuropathy: Difficulty sleeping last night due to neuropathy. She used to be on Gabapentin  for neuropathy but was not effective and longer taking medication. Management at this time is husband is messaging her feet which distracts her from the pain. Chart reviewed noted pregabalin  was rx on 01/12/24 by Dr. Joane. She has not been taking this as she was not sure what it was for. She will start this today. Will call back if no  improvement  Memory-worsening. ADD makes this worse. Last year 10/16/25she died for 40 minutes. She feels that situation made her memory problems worse. Effecting her life. Feels she sometimes has word finding difficulties. Wondering if something can help. She acts ok but internally not doing well Therapist seems very nice seeing her for awhile once every month or every other month-[facility]-she would like increased sessions. She received one session for 40 minutes per month. GOAL: Increase therapy sessions. Would like weekly sessions and greater 40 minutes.   ADD-used to be on Adderall about 10-20 years ago and feels that was effective but it stopped working. She has cardiomyopathy and was unable to adjust dose. Has not been on medication for ADD for several years. Interested in treatment. She has seen cardiologist recently, she spoke with cardiologist about ADD treatment and she reports cardiologist would support this decision to seek treatment for ADD. GOAL: revisit medication for control of ADD symptoms.  Patient #1 Goal: Would like husband to be her caregiver via [facility]. She needs contact information.   Declined offered office visit, would like follow up call from clinic.  Protocols used: PCP Call - No Triage-A-AH

## 2024-02-05 ENCOUNTER — Telehealth (HOSPITAL_COMMUNITY): Payer: Self-pay | Admitting: Physician Assistant

## 2024-02-05 NOTE — Telephone Encounter (Signed)
 Patient called to follow up on whether you have received a call from her cardiologist. Her number is [phone]. Please advise. Thank you

## 2024-02-06 NOTE — Telephone Encounter (Signed)
 Unable to reach [name] [facility]

## 2024-02-10 ENCOUNTER — Encounter: Payer: Self-pay | Admitting: Cardiology

## 2024-02-11 ENCOUNTER — Ambulatory Visit: Payer: Medicare Other

## 2024-02-11 ENCOUNTER — Encounter: Admitting: Cardiology

## 2024-02-11 VITALS — Ht 64.0 in | Wt 234.0 lb

## 2024-02-11 DIAGNOSIS — Z0000 Encounter for general adult medical examination without abnormal findings: Secondary | ICD-10-CM

## 2024-02-11 NOTE — Patient Instructions (Signed)
 [name],  Thank you for taking the time for your Medicare Wellness Visit. I appreciate your continued commitment to your health goals. Please review the care plan we discussed, and feel free to reach out if I can assist you further.  Please note that Annual Wellness Visits do not include a physical exam. Some assessments may be limited, especially if the visit was conducted virtually. If needed, we may recommend an in-person follow-up with your provider.  Ongoing Care Seeing your primary care provider every 3 to 6 months helps us  monitor your health and provide consistent, personalized care.   Referrals If a referral was made during today's visit and you haven't received any updates within two weeks, please contact the referred provider directly to check on the status.  Recommended Screenings:  Health Maintenance  Topic Date Due   Hepatitis B Vaccine (1 of 3 - 19+ 3-dose series) Never done   Zoster (Shingles) Vaccine (1 of 2) Never done   Eye exam for diabetics  08/09/2023   COVID-19 Vaccine (4 - 2025-26 season) 10/27/2023   Hemoglobin A1C  05/05/2024   Complete foot exam   05/21/2024   Breast Cancer Screening  09/23/2024   Colon Cancer Screening  02/11/2026   Pap with HPV screening  08/02/2026   DTaP/Tdap/Td vaccine (6 - Td or Tdap) 11/08/2027   Pneumococcal Vaccine for age over 73  Completed   Flu Shot  Completed   Hepatitis C Screening  Completed   HIV Screening  Completed   HPV Vaccine  Aged Out   Meningitis B Vaccine  Aged Out       02/11/2024    8:28 AM  Advanced Directives  Does Patient Have a Medical Advance Directive? No  Would patient like information on creating a medical advance directive? No - Patient declined    Vision: Annual vision screenings are recommended for early detection of glaucoma, cataracts, and diabetic retinopathy. These exams can also reveal signs of chronic conditions such as diabetes and high blood pressure.  Dental: Annual dental  screenings help detect early signs of oral cancer, gum disease, and other conditions linked to overall health, including heart disease and diabetes.

## 2024-02-11 NOTE — Telephone Encounter (Signed)
 Unable to reach [name]. 2nd attmept.

## 2024-02-11 NOTE — Progress Notes (Signed)
 Chief Complaint  Patient presents with   Medicare Wellness     Subjective:   [name] is a 58 y.o. female who presents for a Medicare Annual Wellness Visit.  Visit info / Clinical Intake: Medicare Wellness Visit Type:: Subsequent Annual Wellness Visit Persons participating in visit and providing information:: patient & caregiver (with Spouse, [name] Rondinelli) Medicare Wellness Visit Mode:: Telephone If telephone:: video declined Since this visit was completed virtually, some vitals may be partially provided or unavailable. Missing vitals are due to the limitations of the virtual format.: Documented vitals are patient reported If Telephone or Video please confirm:: I connected with patient using audio/video enable telemedicine. I verified patient identity with two identifiers, discussed telehealth limitations, and patient agreed to proceed. Patient Location:: Home Provider Location:: Office Interpreter Needed?: No Pre-visit prep was completed: yes AWV questionnaire completed by patient prior to visit?: no Living arrangements:: lives with spouse/significant other Patient's Overall Health Status Rating: (!) fair Typical amount of pain: none Does pain affect daily life?: no Are you currently prescribed opioids?: no  Dietary Habits and Nutritional Risks How many meals a day?: 2 Eats fruit and vegetables daily?: yes Most meals are obtained by: having others provide food (Spouse handles) In the last 2 weeks, have you had any of the following?: none Diabetic:: (!) yes Any non-healing wounds?: no How often do you check your BS?: as needed Would you like to be referred to a Nutritionist or for Diabetic Management? : no  Functional Status Activities of Daily Living (to include ambulation/medication): (!) Needs Assist Feeding: Independent Dressing/Grooming: Needs assistance (Spouse helps) Bathing: Needs assistance (Spouse helps) Toileting: Independent Transfer: Independent with  device- listed below (Spouse helps) Ambulation: Independent with device- listed below Home Assistive Devices/Equipment: Eyeglasses; Walker (specify Type); Cane; Shower/tub chair Medication Administration: Dependent (Spouse helps) Is this a change from baseline?: Change from baseline, expected to last >3 days Home Management (perform basic housework or laundry): Dependent (Spouse helps) Manage your own finances?: (!) no (Spouse handles) Primary transportation is: family / friends (Spouse drives) Concerns about vision?: no *vision screening is required for WTM* Concerns about hearing?: no  Fall Screening Falls in the past year?: 0 Number of falls in past year: 0 Was there an injury with Fall?: 0 Fall Risk Category Calculator: 0 Patient Fall Risk Level: Low Fall Risk  Fall Risk Patient at Risk for Falls Due to: No Fall Risks; Impaired balance/gait; Impaired mobility Fall risk Follow up: Falls evaluation completed; Falls prevention discussed  Home and Transportation Safety: All rugs have non-skid backing?: N/A, no rugs All stairs or steps have railings?: yes Grab bars in the bathtub or shower?: (!) no (shower chair) Have non-skid surface in bathtub or shower?: yes Good home lighting?: yes Regular seat belt use?: yes Hospital stays in the last year:: no  Cognitive Assessment Difficulty concentrating, remembering, or making decisions? : no Will 6CIT or Mini Cog be Completed: yes What year is it?: 0 points What month is it?: 0 points Give patient an address phrase to remember (5 components): [address] [place], [place] About what time is it?: 0 points Count backwards from 20 to 1: 0 points Say the months of the year in reverse: 0 points Repeat the address phrase from earlier: 4 points (27; [place]) 6 CIT Score: 4 points  Advance Directives (For Healthcare) Does Patient Have a Medical Advance Directive?: No Would patient like information on creating a medical advance directive?:  No - Patient declined  Reviewed/Updated  Reviewed/Updated: Reviewed All (Medical, Surgical, Family, Medications, Allergies, Care Teams, Patient Goals)    Allergies (verified) Fluoxetine, Cymbalta [duloxetine hcl], and Porcine (pork) protein-containing drug products   Current Medications (verified) Outpatient Encounter Medications as of 02/11/2024  Medication Sig   ACCU-CHEK GUIDE TEST test strip TEST BLOOD SUGARS MORNING, NOON, AND AT BEDTIME   Accu-Chek Softclix Lancets lancets Use as instructed   AMBULATORY NON FORMULARY MEDICATION Off-loading wheel chair seat cusion Dispense 1 Dx code: M53.3 Use as needed   atorvastatin  (LIPITOR ) 80 MG tablet TAKE 1 TABLET BY MOUTH EVERY DAY   B Complex-C (B-COMPLEX WITH VITAMIN C ) tablet Take 1 tablet by mouth daily.   Blood Glucose Monitoring Suppl DEVI 1 each by Does not apply route in the morning, at noon, and at bedtime. May substitute to any manufacturer covered by patient's insurance.   Cholecalciferol (VITAMIN D3) 50 MCG (2000 UT) capsule Take 1 capsule (2,000 Units total) by mouth daily.   clotrimazole -betamethasone  (LOTRISONE ) cream Apply 1 Application topically 2 (two) times daily.   collagenase  (SANTYL ) 250 UNIT/GM ointment Apply 1 Application topically daily.   dapagliflozin  propanediol (FARXIGA ) 10 MG TABS tablet TAKE 1 TABLET BY MOUTH EVERY DAY   fluconazole  (DIFLUCAN ) 100 MG tablet Take 2 tabs on day#1, then 1 tab daily on Days #2-7   furosemide  (LASIX ) 40 MG tablet Take 1 tablet (40 mg total) by mouth daily.   gentamicin  ointment (GARAMYCIN ) 0.1 % Apply 1 Application topically at bedtime.   hydrOXYzine  (ATARAX ) 25 MG tablet TAKE 1 TABLET BY MOUTH THREE TIMES A DAY AS NEEDED   linaclotide  (LINZESS ) 145 MCG CAPS capsule Take 1 capsule (145 mcg total) by mouth daily before breakfast.   losartan  (COZAAR ) 25 MG tablet Take 0.5 tablets (12.5 mg total) by mouth daily.   magnesium  oxide (MAG-OX) 400 MG tablet Take 1 tablet (400 mg total)  by mouth at bedtime.   meclizine  (ANTIVERT ) 12.5 MG tablet Take 1 tablet (12.5 mg total) by mouth 3 (three) times daily as needed.   metoprolol  succinate (TOPROL -XL) 50 MG 24 hr tablet TAKE 1 TABLET BY MOUTH DAILY. TAKE WITH OR IMMEDIATELY FOLLOWING A MEAL.   NON FORMULARY Pt uses cpap nightly   polyethylene glycol powder (GLYCOLAX /MIRALAX ) 17 GM/SCOOP powder Take 1 capful (17 g) in water 2 (two) times daily.   potassium chloride  SA (KLOR-CON  M) 20 MEQ tablet TAKE 1 TABLET BY MOUTH EVERY DAY   pregabalin  (LYRICA ) 75 MG capsule Take 1 capsule (75 mg total) by mouth at bedtime as needed (nerve pain).   spironolactone  (ALDACTONE ) 25 MG tablet TAKE 1/2 TABLET BY MOUTH EVERY DAY   tirzepatide  (MOUNJARO ) 2.5 MG/0.5ML Pen Inject 2.5 mg into the skin once a week. For 4 weeks, then will increase to 5 mg weekly   zinc  gluconate 50 MG tablet Take 1 tablet (50 mg total) by mouth daily.   phentermine  (ADIPEX-P ) 37.5 MG tablet Take 1 tablet (37.5 mg total) by mouth daily before breakfast. (Patient not taking: Reported on 01/19/2024)   No facility-administered encounter medications on file as of 02/11/2024.    History: 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/2023   ADD (attention deficit disorder)    Anxiety    Arthritis    both knees right worse than left   Carpal tunnel syndrome of right wrist    Chronic back pain    Dysrhythmia    history of Vtach   Enterococcus faecalis infection 04/29/2023   Essential hypertension, benign  Gallstones    History of smoking 06/22/2016   Hyperlipidemia associated with type 2 diabetes mellitus (HCC), on Zocor  04/04/2011   Hypertension associated with diabetes (HCC) 06/03/2008   MDD (major depressive disorder)    Memory impairment    post cardiac arrest   Migraines    Morbid obesity (HCC)    Morbid obesity with BMI of 50.0-59.9, adult (HCC) 07/12/2006   NICM (nonischemic cardiomyopathy) (HCC) 07/12/2006   01/16/18 ECHO:    -  Procedure narrative: Transthoracic echocardiography. Image   quality was suboptimal. The study was technically difficult.   Intravenous contrast (Definity ) was administered. - Left ventricle: The cavity size was moderately dilated. Wall   thickness was increased in a pattern of mild LVH. Systolic   function was moderately to severely reduced. 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   Pseudomonas infection 04/29/2023   Spinal stenosis    back pain   Spondylosis without myelopathy or radiculopathy, lumbar region 12/04/2017   Type 2 diabetes mellitus with hyperglycemia (HCC)    Walker as ambulation aid    Past Surgical History:  Procedure Laterality Date   CHOLECYSTECTOMY     DILATATION & CURETTAGE/HYSTEROSCOPY WITH MYOSURE N/A 10/31/2017   Procedure: DILATATION & CURETTAGE/HYSTEROSCOPY WITH MYOSURE;  Surgeon: Jannis Kate Norris, MD;  Location: [facility] OR[facility];  Service: Gynecology;  Laterality: N/A;   HYSTEROSCOPY WITH D & C N/A 05/07/2022   Procedure: DILATATION AND CURETTAGE /HYSTEROSCOPY;  Surgeon: Jeralyn Crutch, MD;  Location: [facility] OR;  Service: Gynecology;  Laterality: N/A;   ICD IMPLANT N/A 11/25/2023   Procedure: ICD IMPLANT;  Surgeon: Inocencio Soyla Lunger, MD;  Location[facility] INVASIVE CV LAB;  Service: Cardiovascular;  Laterality: N/A;   INCISION AND DRAINAGE OF WOUND Left 02/06/2023   Procedure: debridement of left leg wound, application of myriad;  Surgeon: Waddell Leonce NOVAK, MD;  Location: [facility] OR;  Service: Plastics;  Laterality: Left;   INCISION AND DRAINAGE OF WOUND Left 02/24/2023   Procedure: application of tissue replacement matrix of left patellar tendon;  Surgeon: Waddell Leonce NOVAK, MD;  Location: [facility] OR;  Service: Plastics;  Laterality: Left;   INCISION AND DRAINAGE OF WOUND Left 04/15/2023   Procedure: irrigation and debridement to left  lower extremity wound;  Surgeon: Waddell Leonce NOVAK, MD;  Location: [facility] OR;  Service: Plastics;  Laterality: Left;   IRRIGATION AND DEBRIDEMENT KNEE  02/24/2023   Procedure: IRRIGATION AND DEBRIDEMENT KNEE;  Surgeon: Waddell Leonce NOVAK, MD;  Location: [facility] OR;  Service: Plastics;;   life vest     RIGHT/LEFT HEART CATH AND CORONARY ANGIOGRAPHY N/A 04/11/2016   Procedure: Right/Left Heart Cath and Coronary Angiography;  Surgeon: Lonni JONETTA Cash, MD;  Location: [facility] INVASIVE CV LAB;  Service: Cardiovascular;  Laterality: N/A;   RIGHT/LEFT HEART CATH AND CORONARY ANGIOGRAPHY N/A 12/09/2022   Procedure: RIGHT/LEFT HEART CATH AND CORONARY ANGIOGRAPHY;  Surgeon: Ladona Heinz, MD;  Location: [facility] INVASIVE CV LAB;  Service: Cardiovascular;  Laterality: N/A;   sonogram for blood clots     no blockages   Family History  Problem Relation Age of Onset   Depression Mother    Anxiety disorder Mother    Diabetes Mother    Hypertension Mother    ADD / ADHD Father    Diabetes Father    Hypertension Father    Colitis Father  Hypertension Other    Diabetes Other    Colitis Other    Alcohol abuse Other    Breast cancer Neg Hx    BRCA 1/2 Neg Hx    Social History   Occupational History   Not on file  Tobacco Use   Smoking status: Former    Current packs/day: 0.00    Types: Cigarettes    Quit date: 02/25/1997    Years since quitting: 26.9   Smokeless tobacco: Never   Tobacco comments:    Married, lives with spouse (when he is not traveling) and son  Vaping Use   Vaping status: Never Used  Substance and Sexual Activity   Alcohol use: No   Drug use: No   Sexual activity: Not Currently    Partners: Male    Birth control/protection: None   Tobacco Counseling Counseling given: Yes Tobacco comments: Married, lives with spouse (when he is not traveling) and son  SDOH Screenings   Food Insecurity: No Food Insecurity (02/11/2024)  Recent Concern: Food Insecurity - Food Insecurity Present  (12/23/2023)  Housing: Unknown (02/11/2024)  Transportation Needs: No Transportation Needs (02/11/2024)  Utilities: Not At Risk (02/11/2024)  Alcohol Screen: Low Risk (02/10/2023)  Depression (PHQ2-9): Medium Risk (02/11/2024)  Financial Resource Strain: Low Risk (02/10/2023)  Physical Activity: Inactive (02/11/2024)  Social Connections: Socially Isolated (02/11/2024)  Stress: No Stress Concern Present (02/11/2024)  Recent Concern: Stress - Stress Concern Present (12/23/2023)  Tobacco Use: Medium Risk (02/11/2024)  Health Literacy: Inadequate Health Literacy (02/11/2024)   See flowsheets for full screening details  Depression Screen PHQ 2 & 9 Depression Scale- Over the past 2 weeks, how often have you been bothered by any of the following problems? Little interest or pleasure in doing things: 0 Feeling down, depressed, or hopeless (PHQ Adolescent also includes...irritable): 0 PHQ-2 Total Score: 0 Trouble falling or staying asleep, or sleeping too much: 0 Feeling tired or having little energy: 0 Poor appetite or overeating (PHQ Adolescent also includes...weight loss): 0 Feeling bad about yourself - or that you are a failure or have let yourself or your family down: 0 Trouble concentrating on things, such as reading the newspaper or watching television (PHQ Adolescent also includes...like school work): 2 Moving or speaking so slowly that other people could have noticed. Or the opposite - being so fidgety or restless that you have been moving around a lot more than usual: 3 Thoughts that you would be better off dead, or of hurting yourself in some way: 0 PHQ-9 Total Score: 5 If you checked off any problems, how difficult have these problems made it for you to do your work, take care of things at home, or get along with other people?: Very difficult  Depression Treatment Depression Interventions/Treatment : Medication; Currently on Treatment     Goals Addressed                This Visit's Progress     Patient Stated (pt-stated)        Patient stated she plans to continue taking meds daily and needs to walk/exercise more.             Objective:    Today's Vitals   02/11/24 0824  Weight: 234 lb (106.1 kg)  Height: 5' 4 (1.626 m)   Body mass index is 40.17 kg/m.  Hearing/Vision screen Hearing Screening - Comments:: Denies hearing difficulties   Vision Screening - Comments:: Wears rx glasses - up to date with routine eye exams with  Immunizations and Health Maintenance Health Maintenance  Topic Date Due   Hepatitis B Vaccines 19-59 Average Risk (1 of 3 - 19+ 3-dose series) Never done   Zoster Vaccines- Shingrix  (1 of 2) Never done   OPHTHALMOLOGY EXAM  08/09/2023   COVID-19 Vaccine (4 - 2025-26 season) 10/27/2023   HEMOGLOBIN A1C  05/05/2024   FOOT EXAM  05/21/2024   Mammogram  09/23/2024   Colonoscopy  02/11/2026   Cervical Cancer Screening (HPV/Pap Cotest)  08/02/2026   DTaP/Tdap/Td (6 - Td or Tdap) 11/08/2027   Pneumococcal Vaccine: 50+ Years  Completed   Influenza Vaccine  Completed   Hepatitis C Screening  Completed   HIV Screening  Completed   HPV VACCINES  Aged Out   Meningococcal B Vaccine  Aged Out        Assessment/Plan:  This is a routine wellness examination for Zhane.  Patient Care Team: Joshua Debby CROME, MD as PCP - General (Internal Medicine) Jannis Kate Norris, MD as PCP - OBGYN (Obstetrics and Gynecology) Lynwood Schilling, MD as PCP - Cardiology (Cardiology) Inocencio Soyla Lunger, MD as PCP - Electrophysiology (Cardiology) Lynwood Schilling, MD as Consulting Physician (Cardiology) Frances Ozell RAMAN, LCSW as [facility] Care Management (Licensed Clinical Social Worker) Robinson Mayo, [place] as Referring Physician (Optometry)  I have personally reviewed and noted the following in the patients chart:   Medical and social history Use of alcohol, tobacco or illicit drugs  Current medications and supplements including opioid  prescriptions. Functional ability and status Nutritional status Physical activity Advanced directives List of other physicians Hospitalizations, surgeries, and ER visits in previous 12 months Vitals Screenings to include cognitive, depression, and falls Referrals and appointments  No orders of the defined types were placed in this encounter.  In addition, I have reviewed and discussed with patient certain preventive protocols, quality metrics, and best practice recommendations. A written personalized care plan for preventive services as well as general preventive health recommendations were provided to patient.   Verdie CHRISTELLA Saba, CMA   02/11/2024   Return in 1 year (on 02/10/2025).  After Visit Summary: (MyChart) Due to this being a telephonic visit, the after visit summary with patients personalized plan was offered to patient via MyChart   Nurse Notes: scheduled 6-mth f/u w/PCP; scheduled 2026 AWV/CPE appts

## 2024-02-15 ENCOUNTER — Other Ambulatory Visit: Payer: Self-pay | Admitting: Internal Medicine

## 2024-02-15 DIAGNOSIS — E1165 Type 2 diabetes mellitus with hyperglycemia: Secondary | ICD-10-CM

## 2024-02-16 NOTE — Telephone Encounter (Signed)
5 mg sent

## 2024-02-16 NOTE — Telephone Encounter (Signed)
 Called pt and advised of what clinical manager told me.   Pt verbalized understanding, will proceed with PT at [facility].

## 2024-02-16 NOTE — Telephone Encounter (Signed)
 [name] and spoke with development worker, international aid. She let me know that insurance will not cover home health services once pt starts outpatient services. She is not aware of any work around for this with Medicare/Medicaid.

## 2024-02-17 ENCOUNTER — Telehealth: Payer: Self-pay

## 2024-02-17 ENCOUNTER — Encounter: Payer: Self-pay | Admitting: Cardiology

## 2024-02-17 NOTE — Telephone Encounter (Signed)
 Call received to device clinic.  Per Pt she received a shock from her defibrillator.  Transmission received and reviewed.  Appears Pt went into atrial fibrillation today 02/17/2024 at 9:28 am.    Pt with rapid ventricular response.  At 12:34 pm Pt's Afib with RVR entered VF zone where she received 2 rounds of ATP and then a shock.  Spoke with Pt.  She is upset but is not having any cardiac symptoms.  Denies chest pain, shortness of breath or dizziness.  No cardiac awareness of heart racing.  Reviewed strip with Dr. Nancey.  Confirmed inappropriate shock for atrial fibrillation.  Discussed with DOD.  Advised ok to increase Toprol  50 mg to BID if blood pressure allows.  Spoke with Pt.  Blood pressure 107/85.  Advised Pt to go ahead and take an additional Toprol  50 mg.  She took her regular dose this AM.  Advised would follow up with Pt by end of day today with long term plan.

## 2024-02-23 NOTE — Telephone Encounter (Signed)
 Please advise all these concerns from the patient that she brought up to the social worker. Do she need an appointment again ?

## 2024-02-23 NOTE — Telephone Encounter (Signed)
 Sleep difficulties and the patient states that she goes to the bathroom a lot. Still using a cane or walker. Slow healing wound on her L leg. [place]. Worried about her health and food stamps. Needed care in the home. Memory issues. Jumping from topic to topic and can't stay focused. Husband helped with medication scheduled.

## 2024-02-24 ENCOUNTER — Ambulatory Visit: Attending: Pulmonary Disease | Admitting: Cardiology

## 2024-02-24 ENCOUNTER — Encounter: Payer: Self-pay | Admitting: Pulmonary Disease

## 2024-02-24 VITALS — BP 112/84 | HR 78 | Ht 64.0 in | Wt 237.0 lb

## 2024-02-24 DIAGNOSIS — E1159 Type 2 diabetes mellitus with other circulatory complications: Secondary | ICD-10-CM | POA: Insufficient documentation

## 2024-02-24 DIAGNOSIS — I428 Other cardiomyopathies: Secondary | ICD-10-CM | POA: Diagnosis not present

## 2024-02-24 DIAGNOSIS — Z95810 Presence of automatic (implantable) cardiac defibrillator: Secondary | ICD-10-CM | POA: Insufficient documentation

## 2024-02-24 DIAGNOSIS — I4901 Ventricular fibrillation: Secondary | ICD-10-CM | POA: Insufficient documentation

## 2024-02-24 DIAGNOSIS — I480 Paroxysmal atrial fibrillation: Secondary | ICD-10-CM | POA: Insufficient documentation

## 2024-02-24 DIAGNOSIS — G4733 Obstructive sleep apnea (adult) (pediatric): Secondary | ICD-10-CM | POA: Diagnosis not present

## 2024-02-24 DIAGNOSIS — I152 Hypertension secondary to endocrine disorders: Secondary | ICD-10-CM | POA: Insufficient documentation

## 2024-02-24 LAB — CUP PACEART INCLINIC DEVICE CHECK
Battery Remaining Longevity: 167 mo
Battery Voltage: 3.07 V
Brady Statistic RA Percent Paced: INVALID
Brady Statistic RV Percent Paced: 0 %
Date Time Interrogation Session: 20251230171044
HighPow Impedance: 73 Ohm
Implantable Lead Connection Status: 753985
Implantable Lead Implant Date: 20250930
Implantable Lead Location: 753860
Implantable Pulse Generator Implant Date: 20250930
Lead Channel Impedance Value: 380 Ohm
Lead Channel Impedance Value: 475 Ohm
Lead Channel Pacing Threshold Amplitude: 0.625 V
Lead Channel Pacing Threshold Amplitude: 0.75 V
Lead Channel Pacing Threshold Pulse Width: 0.4 ms
Lead Channel Pacing Threshold Pulse Width: 0.4 ms
Lead Channel Sensing Intrinsic Amplitude: 15.6 mV
Lead Channel Setting Pacing Amplitude: 2.5 V
Lead Channel Setting Pacing Pulse Width: 0.4 ms
Lead Channel Setting Sensing Sensitivity: 0.3 mV
Zone Setting Status: 755011

## 2024-02-24 MED ORDER — METOPROLOL SUCCINATE ER 50 MG PO TB24: 50.0000 mg | ORAL_TABLET | Freq: Two times a day (BID) | ORAL | Status: AC

## 2024-02-24 MED ORDER — AMIODARONE HCL 200 MG PO TABS: 200.0000 mg | ORAL_TABLET | Freq: Every day | ORAL | 3 refills | Status: AC

## 2024-02-24 MED ORDER — APIXABAN 5 MG PO TABS: 5.0000 mg | ORAL_TABLET | Freq: Two times a day (BID) | ORAL | 3 refills | Status: AC

## 2024-02-24 NOTE — Progress Notes (Signed)
 " Electrophysiology Office Note:   ID:  Samantha, [name] [birth year], MRN [id number]  Primary Cardiologist: Lynwood Schilling, MD Electrophysiologist: Soyla [name], MD      History of Present Illness:   [name] is a 58 y.o. female with h/o  hypertension, chronic systolic heart failure due to nonischemic cardiomyopathy, sleep apnea, CKD, diabetes, VF arrest seen today for routine electrophysiology follow-up s/p Defibrillator implant.  On 02/17/24 we received call to device clinic regarding ICD shock. Transmission reviewed and appeared to show atrial fibrillation with RVR. Afib entered VF zone and she received 2 rounds of ATP and then 1 shock. Patient understandably traumatized by the shock but otherwise was feeling okay when she spoke with device clinic. Dr. Mealor increased her metoprolol  succinate to 50mg  BID. Arrhythmia episodes prior to ICD shock show multiple episodes of afib, up to 5 hrs . She confirms today that she has not noted symptoms related to afib, has not had palpitations, chest discomfort, dyspnea.   Her left lower leg continues to be painful following infiltration last year from the IO vascular access established during her cardiac arrest. She also expresses frustration related to management of her anxiety and ADHD. Is not currently taking Adderall but wishes to restart as she feels this is the only medication that's ever worked.   Review of systems complete and found to be negative unless listed in HPI.   EP Information / Studies Reviewed:    EKG is not ordered today. EKG from 11/25/23 reviewed which showed sinus rhythm with isolated PVC.       ICD Interrogation-  reviewed in detail today,  See PACEART report.  Arrhythmia/Device History ICD MDT WC   Implant Name Type Inv. Item Serial No. Manufacturer Lot No. LRB No. Used Action  LEAD SPRINT QUAT SEC [id number] - [id number] V Lead LEAD SPRINT QUAT SEC [id number] V [employer]IC RHY[employer]M [employer]NA[employer]  N/A 1  Implanted  ICD COBALT XT VR [id number] - [id number] S ICD Generator ICD COBALT XT VR [id number] S [employer]HY[employer]M [employer]  N/A 1 Implanted     Physical Exam:   VS:  LMP  (LMP Unknown)    Wt Readings from Last 3 Encounters:  02/11/24 234 lb (106.1 kg)  01/12/24 238 lb (108 kg)  01/08/24 235 lb 12.8 oz (107 kg)     GEN: No acute distress NECK: No JVD; No carotid bruits CARDIAC: Regular rate and rhythm, no murmurs, rubs, gallops RESPIRATORY:  Clear to auscultation without rales, wheezing or rhonchi  ABDOMEN: Soft, non-tender, non-distended EXTREMITIES:  Non-pitting edema and erythema left lower extremity.   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  Impedence, sensing, threshold stable. Inappropriate shock for afib (see below).  See Elisabeth Art report for additional details. RV output reprogrammed from acute phase to chronic.   Paroxysmal atrial fibrillation Secondary hypercoagulable state Inappropriate ICD shock Patient with documented episodes of afib starting 01/06/24. Longest episodes on 11/23 and 11/26 (4 hrs 18 min and 5 hrs respectively). On 12/23 had rapid afib in VF zone resulting in inappropriate ICD shock. Metoprolol  succinate increased to 50mg  BID and on device interrogation today, no further afib reported. Discussed patient with Dr. Norton today, will start her on Eliquis 5mg  BID with CHADS2VASC score 4. Will also start Amiodarone  200mg  daily as a bridge to possible ablation (may not be candidate due to body  habitus), scheduling appointment with Dr. Inocencio in late January to discuss. Patient admits to some difficulty with medication compliance and I think ablation would be a better option for her than long-term AAD. Will have her continue Metoprolol  Succinate 50mg  BID.  Obstructive sleep  apnea Patient with prior diagnosis of OSA, not currently using CPAP citing discomfort. Discussed that OSA is a risk factor for afib. Will refer patient to pulmonology to discuss optimized management of her OSA.  Hypertension Blood pressure is well-controlled on current antihypertensive regimen. Continue current medications and dosing.   ADD Patient recently requesting to restart Adderall. Discussed that with newly noted afib with RVR, from EP perspective, would recommend avoiding stimulant based treatment.   Disposition:   Follow up with Dr. Inocencio in 12 months   Signed, Artist Pouch, PA-C  "

## 2024-02-24 NOTE — Patient Instructions (Signed)
 Thank you for choosing [facility]!     Medication Instructions:  Start Amiodarone  200mg . Take one tablet daily.  Start Eliquis 5mg . Take one tablet two times a day.  A referral has been placed for you for Pulmonology.   *If you need a refill on your cardiac medications before your next appointment, please call your pharmacy*   Lab Work: No labs were ordered during today's visit.  If you have labs (blood work) drawn today and your tests are completely normal, you will receive your results only by: MyChart Message (if you have MyChart) OR A paper copy in the mail If you have any lab test that is abnormal or we need to change your treatment, we will call you to review the results.   Testing/Procedures: No procedures were ordered during today's visit.   Your next appointment:  first available appointment      Provider:   Will Camnitz    Follow-Up: At [facility], you and your health needs are our priority.  As part of our continuing mission to provide you with exceptional heart care, we have created designated Provider Care Teams.  These Care Teams include your primary Cardiologist (physician) and Advanced Practice Providers (APPs -  Physician Assistants and Nurse Practitioners) who all work together to provide you with the care you need, when you need it. We recommend signing up for the patient portal called MyChart.  Sign up information is provided on this After Visit Summary.  MyChart is used to connect with patients for Virtual Visits (Telemedicine).  Patients are able to view lab/test results, encounter notes, upcoming appointments, etc.  Non-urgent messages can be sent to your provider as well.   To learn more about what you can do with MyChart, go to [email or web address].

## 2024-02-25 ENCOUNTER — Encounter (HOSPITAL_BASED_OUTPATIENT_CLINIC_OR_DEPARTMENT_OTHER): Admitting: Internal Medicine

## 2024-02-25 DIAGNOSIS — T798XXA Other early complications of trauma, initial encounter: Secondary | ICD-10-CM

## 2024-02-25 DIAGNOSIS — L97828 Non-pressure chronic ulcer of other part of left lower leg with other specified severity: Secondary | ICD-10-CM

## 2024-02-25 DIAGNOSIS — T8140XA Infection following a procedure, unspecified, initial encounter: Secondary | ICD-10-CM | POA: Diagnosis not present

## 2024-02-27 ENCOUNTER — Ambulatory Visit: Payer: Self-pay | Admitting: Cardiology

## 2024-03-02 ENCOUNTER — Other Ambulatory Visit: Payer: Self-pay | Admitting: Cardiology

## 2024-03-03 ENCOUNTER — Ambulatory Visit (INDEPENDENT_AMBULATORY_CARE_PROVIDER_SITE_OTHER): Admitting: Physician Assistant

## 2024-03-03 VITALS — BP 134/87 | HR 86 | Temp 98.1°F | Ht 63.0 in | Wt 237.8 lb

## 2024-03-03 DIAGNOSIS — F419 Anxiety disorder, unspecified: Secondary | ICD-10-CM | POA: Diagnosis not present

## 2024-03-03 DIAGNOSIS — F988 Other specified behavioral and emotional disorders with onset usually occurring in childhood and adolescence: Secondary | ICD-10-CM

## 2024-03-03 DIAGNOSIS — F332 Major depressive disorder, recurrent severe without psychotic features: Secondary | ICD-10-CM | POA: Diagnosis not present

## 2024-03-03 MED ORDER — AMPHETAMINE-DEXTROAMPHETAMINE 15 MG PO TABS: 15.0000 mg | ORAL_TABLET | Freq: Every day | ORAL | 0 refills | Status: DC

## 2024-03-03 MED ORDER — AMPHETAMINE-DEXTROAMPHETAMINE 10 MG PO TABS: 10.0000 mg | ORAL_TABLET | Freq: Every day | ORAL | 0 refills | Status: DC

## 2024-03-08 ENCOUNTER — Telehealth: Payer: Self-pay | Admitting: Internal Medicine

## 2024-03-08 NOTE — Telephone Encounter (Signed)
 Patient dropped off document Home Health documentation to be filled out by provider. Patient requested to send it back via Call Patient to pick up within 7-days. Document is located in providers tray at front office.Please advise at Mobile [phone] (mobile)

## 2024-03-08 NOTE — Telephone Encounter (Unsigned)
 Copied from CRM [id number]. Topic: General - Other >> Mar 08, 2024 11:52 AM Tiffini S wrote: Reason for CRM: Patient spouse will drop off documents for [employer] ([employer] under [place] [employer] for the provider- patient is asking for the original forms back once filled out- please complete by 03/18/24     Please call when the documents are ready for pick up at [phone]

## 2024-03-10 ENCOUNTER — Ambulatory Visit (HOSPITAL_BASED_OUTPATIENT_CLINIC_OR_DEPARTMENT_OTHER): Attending: Family Medicine | Admitting: Physical Therapy

## 2024-03-10 ENCOUNTER — Encounter (HOSPITAL_BASED_OUTPATIENT_CLINIC_OR_DEPARTMENT_OTHER): Payer: Self-pay | Admitting: Physical Therapy

## 2024-03-10 ENCOUNTER — Other Ambulatory Visit: Payer: Self-pay

## 2024-03-10 DIAGNOSIS — R2689 Other abnormalities of gait and mobility: Secondary | ICD-10-CM

## 2024-03-10 DIAGNOSIS — G8929 Other chronic pain: Secondary | ICD-10-CM | POA: Diagnosis not present

## 2024-03-10 DIAGNOSIS — M25512 Pain in left shoulder: Secondary | ICD-10-CM | POA: Diagnosis present

## 2024-03-10 DIAGNOSIS — M542 Cervicalgia: Secondary | ICD-10-CM

## 2024-03-10 DIAGNOSIS — M6281 Muscle weakness (generalized): Secondary | ICD-10-CM

## 2024-03-10 DIAGNOSIS — M5459 Other low back pain: Secondary | ICD-10-CM

## 2024-03-10 NOTE — Telephone Encounter (Signed)
 Paperwork has been received. I have placed the paperwork on Dr. Joshua desk.

## 2024-03-10 NOTE — Therapy (Signed)
 " OUTPATIENT PHYSICAL THERAPY LOWER EXTREMITY EVALUATION   Patient Name: [name] MRN: [id number] DOB:14-Apr-1965, 59 y.o., female Today's Date: 03/11/2024  END OF SESSION:  PT End of Session - 03/11/24 1354     Visit Number 1    Number of Visits 16    Date for Recertification  05/06/24    PT Start Time 1345    PT Stop Time 1428    PT Time Calculation (min) 43 min    Activity Tolerance Patient tolerated treatment well    Behavior During Therapy [facility] for tasks assessed/performed          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/2023   ADD (attention deficit disorder)    Anxiety    Arthritis    both knees right worse than left   Carpal tunnel syndrome of right wrist    Chronic back pain    Dysrhythmia    history of Vtach   Enterococcus faecalis infection 04/29/2023   Essential hypertension, benign    Gallstones    History of smoking 06/22/2016   Hyperlipidemia associated with type 2 diabetes mellitus (HCC), on Zocor  04/04/2011   Hypertension associated with diabetes (HCC) 06/03/2008   ICD (implantable cardioverter-defibrillator) in place 02/24/2024   MDD (major depressive disorder)    Memory impairment    post cardiac arrest   Migraines    Morbid obesity (HCC)    Morbid obesity with BMI of 50.0-59.9, adult (HCC) 07/12/2006   NICM (nonischemic cardiomyopathy) (HCC) 07/12/2006   01/16/18 ECHO:    - Procedure narrative: Transthoracic echocardiography. Image   quality was suboptimal. The study was technically difficult.   Intravenous contrast (Definity ) was administered. - Left ventricle: The cavity size was moderately dilated. Wall   thickness was increased in a pattern of mild LVH. Systolic   function was moderately to severely reduced. 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   Pseudomonas  infection 04/29/2023   Spinal stenosis    back pain   Spondylosis without myelopathy or radiculopathy, lumbar region 12/04/2017   Type 2 diabetes mellitus with hyperglycemia (HCC)    Walker as ambulation aid    Past Surgical History:  Procedure Laterality Date   CHOLECYSTECTOMY     DILATATION & CURETTAGE/HYSTEROSCOPY WITH MYOSURE N/A 10/31/2017   Procedure: DILATATION & CURETTAGE/HYSTEROSCOPY WITH MYOSURE;  Surgeon: Jannis Kate Norris, MD;  Location: [facility] ORS;  Service: Gynecology;  Laterality: N/A;   HYSTEROSCOPY WITH D & C N/A 05/07/2022   Procedure: DILATATION AND CURETTAGE /HYSTEROSCOPY;  Surgeon: Jeralyn Crutch, MD;  Location: [facility] OR;  Service: Gynecology;  Laterality: N/A;   ICD IMPLANT N/A 11/25/2023   Procedure: ICD IMPLANT;  Surgeon: Inocencio Soyla Lunger, MD;  Location: [facility] INVASIVE CV LAB;  Service: Cardiovascular;  Laterality: N/A;   INCISION AND DRAINAGE OF WOUND Left 02/06/2023   Procedure: debridement of left leg wound, application of myriad;  Surgeon: Waddell Leonce NOVAK, MD;  Location: [facility] OR;  Service: Plastics;  Laterality: Left;   INCISION AND DRAINAGE OF WOUND Left 02/24/2023   Procedure: application of tissue replacement matrix of left patellar tendon;  Surgeon: Waddell Leonce NOVAK, MD;  Location: [facility] OR;  Service: Plastics;  Laterality: Left;   INCISION AND DRAINAGE OF WOUND Left 04/15/2023   Procedure: irrigation and debridement to left lower extremity wound;  Surgeon: Waddell Leonce NOVAK, MD;  Location: [facility] OR;  Service: Plastics;  Laterality: Left;   IRRIGATION AND DEBRIDEMENT KNEE  02/24/2023   Procedure: IRRIGATION AND DEBRIDEMENT KNEE;  Surgeon: Waddell Leonce NOVAK, MD;  Location: [facility] OR;  Service: Plastics;;   life vest     RIGHT/LEFT HEART CATH AND CORONARY ANGIOGRAPHY N/A 04/11/2016   Procedure: Right/Left Heart Cath and Coronary Angiography;  Surgeon: Lonni JONETTA Cash, MD;  Location: [facility] INVASIVE CV LAB;  Service: Cardiovascular;  Laterality: N/A;   RIGHT/LEFT  HEART CATH AND CORONARY ANGIOGRAPHY N/A 12/09/2022   Procedure: RIGHT/LEFT HEART CATH AND CORONARY ANGIOGRAPHY;  Surgeon: Ladona Heinz, MD;  Location: [facility] INVASIVE CV LAB;  Service: Cardiovascular;  Laterality: N/A;   sonogram for blood clots     no blockages   Patient Active Problem List   Diagnosis Date Noted   Paroxysmal atrial fibrillation (HCC) 02/24/2024   ICD (implantable cardioverter-defibrillator) in place 02/24/2024   Acquired zinc  deficiency 01/15/2024   Deficiency anemia 01/08/2024   Vaginal discharge 01/08/2024   Painful diabetic neuropathy (HCC) 01/08/2024   Chronic back pain    Knee pain 12/19/2023   COVID-19 11/06/2023   Yeast vaginitis 08/18/2023   Vertigo 08/18/2023   Osteomyelitis of tibia (HCC) 07/15/2023   Actinomyces infection 04/29/2023   Pseudomonas infection 04/29/2023   Enterococcus faecalis infection 04/29/2023   Memory impairment 03/14/2023   Morbid obesity (HCC) 02/18/2023   Penicillin allergy  02/13/2023   Encounter for palliative care involving management of pain 01/28/2023   Need for immunization against influenza 01/15/2023   Diabetes mellitus (HCC) 12/19/2022   Ventricular fibrillation (HCC) 12/10/2022   Onychomycosis 11/15/2022   Anxiety 10/27/2022   Rosacea, acne 08/13/2022   Primary osteoarthritis of both knees 03/06/2022   Bilateral ovarian cysts 03/06/2022   Major depressive disorder, recurrent episode, severe with anxious distress (HCC) 08/09/2021   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   Need for varicella vaccine 08/09/2021   Urge incontinence 08/01/2021   Tinea corporis 03/14/2021   Hyperlipidemia LDL goal <100 03/01/2021   DDD (degenerative disc disease), lumbar 08/08/2020   Non-seasonal allergic rhinitis due to pollen 07/25/2020   Chronic idiopathic constipation 07/25/2020   Visit for screening mammogram 02/11/2020   Chronic bilateral low back pain without sciatica 02/10/2020   Spondylosis without  myelopathy or radiculopathy, lumbar region 12/04/2017   Type 2 diabetes mellitus with hyperglycemia (HCC)    OSA on CPAP 10/28/2014   Chronic combined systolic and diastolic heart failure (HCC) 12/06/2013   Hyperlipidemia associated with type 2 diabetes mellitus (HCC), on Zocor  04/04/2011   Hypertension associated with diabetes (HCC) 06/03/2008   Morbid obesity with BMI of 50.0-59.9, adult (HCC) 07/12/2006   NICM (nonischemic cardiomyopathy) (HCC) 07/12/2006   Migraine without status migrainosus, not intractable 07/12/2006    PCP: Debby Molt MD   REFERRING PROVIDER: [name] MD  REFERRING DIAG: \  Diagnosis  M25.561,G89.29 (ICD-10-CM) - Chronic pain of right knee   Diagnosis  M25.512,G89.29 (ICD-10-CM) - Chronic left shoulder pain  M25.511,G89.29 (ICD-10-CM) - Chronic right shoulder pain    THERAPY DIAG:  Other low back pain  Muscle weakness (generalized)  Chronic pain of left knee  Chronic left shoulder pain  Neck pain  Rationale for Evaluation and Treatment: Rehabilitation  ONSET DATE: Began PT treatment 03/10/2024. Original event happened in October of 2024. See subjective.   SUBJECTIVE:   SUBJECTIVE STATEMENT: Patient was attending physical therapy in the past for back issues back issues and was getting better  but than in October suffered a cardiac event  During the emergency response, providers gave her a shunt for her knee however it didn't go in correctly. She developed a wound that caused osteomyelitis and a bacterial infection. She received anti-biotics.  While receiving antibiotics had to be taken off a lot of medication - PT gave pt medication list to review and bring back next session. Pt gets injections for her right knee (OA) and for her feet (hx of plantar fascitis) but doesn't feel benefits last as long as they should. She currently still has an open wound. She is receiving home health at this time weekly and is being seen 1x a month by an MD for her  wound. She was using a scooter for primary mobility but its now using a cane. Her general mobility has declined. She has difficulty doing her hair and showering 2nd to shoulder pain. She developed shoulder pain over the last few months. She feels as though she has a significant amount of muscle atrophy at this time. She feels like she has had a decline in balance since her injury.   * PERTINENT HISTORY: ICD, OA in R knee, LBP, ADD, scoliosis, stenosis, plantar fascitis. Osteomyelitis, major depressive disorder, anxiety, obesity, DMII ( with neuropathy bilateral) anemia PAIN:  Are you having pain? Yes: NPRS scale: na Pain location: multiple regions - shoulder, knee, LBP Pain description: In location of wound - pt compares pain to a boa constrictor squeezing leg due to tightness of new skin. No description of pain given for LBP or shoulder but visible signs and audible ow that hurts when pt reproduced motions.  Aggravating factors: Lifting left shoulder above 85 degrees, kicking left LE out for leg extension. Bending over reproduces LBP.  Relieving factors: None stated.   PRECAUTIONS: Knee, Shoulder, and ICD/Pacemaker  RED FLAGS: Open wound  Yellow flags: depression and chronic pain.  WEIGHT BEARING RESTRICTIONS: No  FALLS:  Has patient fallen in last 6 months? No  LIVING ENVIRONMENT: Lives with: lives with their family and lives with their spouse Has following equipment at home: Quad cane small base  OCCUPATION: None  PLOF: Needs assistance with ADLs, Needs assistance with homemaking, Needs assistance with gait, and Needs assistance with transfers  PATIENT GOALS: To decrease pain, inc strength, and inc balance.    OBJECTIVE:  Note: Objective measures were completed at Evaluation unless otherwise noted.  DIAGNOSTIC FINDINGS:  See imaging section  PATIENT SURVEYS:  Deferred to next visit due to time constraints  COGNITION: Overall cognitive status: Impaired: Areas of  impairment:  Attention: Impaired: Focused, Alternating, Divided Memory: Impaired: Short term     SENSATION: Bilateral peripheral neuropathy   UPPER EXTREMITY ROM:  Active ROM Right eval Left eval  Shoulder flexion 100 80  Shoulder extension    Shoulder abduction    Shoulder adduction    Shoulder extension    Shoulder internal rotation  Cannot reach behind back  Shoulder external rotation  Cannot reach behind head  Elbow flexion    Elbow extension    Wrist flexion    Wrist extension    Wrist ulnar deviation    Wrist radial deviation    Wrist pronation    Wrist supination     (Blank rows = not tested) UE MMT: official MMT not performed due to time constraint however pt stated they can barely lift a gallon of milk  LOWER EXTREMITY ROM: Deferred due to time and pain  Active ROM Right eval Left eval  Hip flexion    Hip extension    Hip abduction    Hip adduction    Hip internal rotation    Hip external rotation    Knee flexion  Painful and limited   Knee extension    Ankle dorsiflexion    Ankle plantarflexion    Ankle inversion    Ankle eversion     (Blank rows = not tested)  LOWER EXTREMITY MMT:  MMT Right eval Left eval  Hip flexion 11.5 8.5  Hip extension    Hip abduction 19.2 12.8  Hip adduction    Hip internal rotation    Hip external rotation    Knee flexion    Knee extension 13 deferred  Ankle dorsiflexion    Ankle plantarflexion    Ankle inversion    Ankle eversion     (Blank rows = not tested)  GAIT: Distance walked: Lobby to tx room ~20 ft Assistive device utilized: Quad cane small base Level of assistance: Complete Independence Comments: Abnormal gait with leaning towards right side and use of cane. Shifted weight off of left leg.                                                                                                                                 TREATMENT DATE: 1/14  Manual: Introduced self-STM to patient and showed pt  thera-can for upper trap and shoulders   Selfcare: discussed shoulder relaxation techniques and movement patterns under the pain threshold to reduce central nervous system sensitivity.   There-ex: Provided pt ed re: shoulder rolls and upright posture with core engagement.   PATIENT EDUCATION:  Education details: self-STM, calm down activities Person educated: Patient Education method: Medical Illustrator Education comprehension: verbalized understanding and needs further education  HOME EXERCISE PROGRAM: Insert code  ASSESSMENT:  CLINICAL IMPRESSION: Patient is a 59 y.o. woman who was seen today for physical therapy evaluation and treatment for left knee pain and bilateral shoulder pain.   Pt is complex due to a variety of conditions and has generalized weakness due to sustained inactivity. Pt will benefit from skilled PT to assist with overall strengthening to the LB, knee, and shoulder. Pt will benefit from therapeutic education regarding chronic pain management. Pt will benefit from balance training. Pt will need weekly checks/management re: wound care.   OBJECTIVE IMPAIRMENTS: Abnormal gait, cardiopulmonary status limiting activity, decreased activity tolerance, decreased balance, decreased cognition, decreased coordination, decreased endurance, decreased mobility, difficulty walking, decreased ROM, decreased strength, impaired perceived functional ability, impaired flexibility, impaired UE functional use, and pain.   ACTIVITY LIMITATIONS: carrying, lifting, bending, sitting, standing, sleeping, transfers, bathing, toileting, dressing, reach over head, hygiene/grooming, and locomotion level  PARTICIPATION LIMITATIONS: cleaning, medication management, interpersonal relationship, driving, community activity, and occupation  PERSONAL FACTORS: Behavior pattern, Past/current experiences, and Time since onset of injury/illness/exacerbation are also affecting patient's functional  outcome. Hip and back pain   REHAB POTENTIAL: Fair Pt has  a complex case due to variety of conditions/activity limitations.   CLINICAL DECISION MAKING: Unstable 2nd to wound and multiple symptoms effecting mobility   EVALUATION COMPLEXITY:  High    GOALS: Goals reviewed with patient? Yes  SHORT TERM GOALS: Target date: 04/08/2024   Patient will increase gross bilateral LE  strength by 5 lbs  Goal Status: Initial    2.  Therapy will assess BERG balance next visit  Baseline:  Goal status: INITIAL  3.  Patient will ambulate > 500' without significant pain  Baseline:  Goal status: INITIAL  4.  Patient will demonstrate 90 degrees of shoulder flexion without pain  Baseline:  Goal status: INITIAL    LONG TERM GOALS: Target date: 05/06/2024    Patient will demonstrate 130 degrees of shoulder flexion bilateral in order to perform daily tasks with <3/10 pain  Baseline:  Goal status: INITIAL  2.  Patient will ambulate community distances with limited pain  Baseline:  Goal status: INITIAL  3.  Patient will go up/down 3 steps with reciprocal gait pattern without pain  Baseline:  Goal status: INITIAL  4.  Wound goals to be assessed  Baseline:  Goal status: INITIAL    PLAN:  PT FREQUENCY: 2x/week  PT DURATION: 8 weeks  PLANNED INTERVENTIONS: 97110-Therapeutic exercises, 97530- Therapeutic activity, [id number]- Neuromuscular re-education, 97535- Self Care, [id number]- Manual therapy, 97116- Gait training, [phone]- Wound care (first 20 sq cm), 97598- Wound care (each additional 20 sq cm), Patient/Family education, Balance training, Stair training, Joint mobilization, Spinal mobilization, and Scar mobilization  PLAN FOR NEXT SESSION: Check on wound and change dressings. Provide pt ed regarding chronic pain management and introduce dynamic activities that will help strengthen multiple regions. Begin gait and balance training. Assess BERG. Wound to be assessed if approved by MD.     Alm JINNY Don, PT 03/11/2024, 1:54 PM   Koryn Lissa Rowles SPT  I have reviewed and concur with this student's documentation.     I have reviewed and concur with this student's documentation.     During this treatment session, the therapist was present, participating in and directing the treatment.  "

## 2024-03-11 ENCOUNTER — Encounter (HOSPITAL_BASED_OUTPATIENT_CLINIC_OR_DEPARTMENT_OTHER): Payer: Self-pay | Admitting: Physical Therapy

## 2024-03-14 ENCOUNTER — Other Ambulatory Visit: Payer: Self-pay | Admitting: Internal Medicine

## 2024-03-15 ENCOUNTER — Other Ambulatory Visit: Payer: Self-pay | Admitting: Licensed Clinical Social Worker

## 2024-03-15 NOTE — Patient Outreach (Signed)
 Complex Care Management   Visit Note  03/15/2024  Name:  [name] MRN: [id number] DOB: Mar 14, 1965  Situation: Referral received for Complex Care Management related to mental health needs I obtained verbal consent from Patient.  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 Image   quality was suboptimal. The study was technically difficult.   Intravenous contrast (Definity ) was administered. - Left ventricle: The cavity size was moderately dilated. Wall   thickness was increased in a pattern of mild LVH. Systolic   function was moderately to severely reduced. 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   Pseudomonas infection 04/29/2023   Spinal stenosis    back pain   Spondylosis without myelopathy or radiculopathy, lumbar region 12/04/2017   Type 2 diabetes mellitus with hyperglycemia (HCC)    Walker as ambulation aid      Assessment: Patient Reported Symptoms:  Cognitive Cognitive Status: Alert and oriented to person, place, and time, Difficulties with attention and concentration   Health Maintenance Behaviors: Stress management Health Facilitated by: Stress management  Neurological Neurological Review of Symptoms: Dizziness, Headaches, Weakness Neurological Management Strategies: Coping strategies  HEENT HEENT Symptoms Reported: No symptoms reported HEENT Management Strategies: Coping strategies    Cardiovascular Cardiovascular Symptoms Reported: Dizziness, Fatigue Cardiovascular Management Strategies: Coping strategies  Respiratory Respiratory Symptoms Reported: Shortness of breath Other Respiratory Symptoms: fatigues when walking Respiratory Management Strategies: Coping strategies  Endocrine Endocrine Symptoms Reported: Blurry vision, Weakness or fatigue, Shakiness Is patient diabetic?: Yes    Gastrointestinal Gastrointestinal Symptoms Reported: Reflux/heartburn Gastrointestinal Management Strategies: Coping strategies    Genitourinary Genitourinary Symptoms Reported: Urgency, Frequency Additional Genitourinary Details: constipation issues; decreased trips to bathroom. Has appointment set up with Gastroenterologist Genitourinary Management Strategies: Coping strategies, Adequate rest  Integumentary Integumentary Symptoms Reported: Skin changes Additional Integumentary Details: monitors skin needs Skin Management Strategies: Coping strategies, Adequate rest  Musculoskeletal Musculoskelatal Symptoms Reviewed: Limited mobility, Unsteady gait, Weakness Musculoskeletal Management Strategies: Coping strategies      Psychosocial Psychosocial Symptoms Reported: Anxiety - if selected complete GAD, Sadness - if selected complete PHQ 2-9 Additional Psychological Details: stress issues related to managing health needs Behavioral Management Strategies: Coping strategies Major  Change/Loss/Stressor/Fears (CP): Medical condition, self Techniques to Cope with Loss/Stress/Change: Diversional activities Quality of Family Relationships: supportive Do you feel physically threatened by others?: No  03/15/2024    PHQ2-9 Depression Screening   Little interest or pleasure in doing things More than half the days  Feeling down, depressed, or hopeless More than half the days  PHQ-2 - Total Score 4  Trouble falling or staying asleep, or sleeping too much More than half the days  Feeling tired or having little energy Several days  Poor appetite or overeating  Several days  Feeling bad about yourself - or that you are a failure or have let yourself or your family down Several days  Trouble concentrating on things, such as reading the newspaper or watching television Several days  Moving or speaking so slowly that other people could have noticed.  Or the opposite - being so fidgety or restless that you have been moving around a lot more than usual Several days  Thoughts that you would be better off dead, or hurting yourself in some way Not at all  PHQ2-9 Total Score 11  If you checked off any problems, how difficult have these problems made it for you to do your work, take care of things at home, or get along with other people Somewhat difficult  Depression Interventions/Treatment Medication, Counseling    Today's Vitals  BP in normal range per client  Pain Scale: 0-10 Pain Score: 8  Pain Type: Chronic pain Pain Location: Generalized Pain Onset: On-going Patients Stated Pain Goal: 1 Pain Intervention(s): Relaxation  Medications Reviewed Today   Medications were not reviewed in this encounter     Recommendation:   PCP Follow-up Continue Current Plan of Care Client to call LCSW as needed for SW support Client to take medications as prescribed Client to attend scheduled mental health appointments  Follow Up Plan:   Telephone follow up appointment date/time:   02/25//2026 at 10:00 AM   [name]  MSW, LCSW [place] Licensed Clinical Social Worker Direct Dial:  [phone] Fax:  [phone] Website:  [email or web address]

## 2024-03-15 NOTE — Patient Instructions (Addendum)
 Visit Information  Thank you for taking time to visit with me today. Please don't hesitate to contact me if I can be of assistance to you before our next scheduled appointment.  Our next appointment is by telephone on 04/21/2024 at 10:00 AM   Please call the care guide team at [phone] if you need to cancel or reschedule your appointment.   Following is a copy of your care plan:   Goals Addressed             This Visit's Progress    VBCI Social Work Care Plan   No change    Problems:   Mobility issues  Uses a cane now to help her walk.              Skin care issues (wound care issue)             Anxiety issues             Mental health  needs (sees counselor at [facility]             Depression issues             Some financial challenges              Memory issues              ADD              Functional challenges                          CSW Clinical Goal(s):   Over the next 30  days the Patient will attend all scheduled Behavioral Health Counseling Appointments as scheduled to help with stress and anxiety issues AEB patient report of attending Behavioral Health appointments scheduled.              Over the next 30 days, client will use coping skills to manage depression issues faced AEB client report of reduction in depression symptoms.(Likes to listen to music; likes to watch favorite TV shows, movies)  Interventions:  Discussed client pain issues. Takes pain medication as prescribed Client said she had procedure for ICD implant in September of 2025             Discussed client transport needs. Client receives transport help from her spouse           Client is concerned about memory loss. She feels she needs more support medically.  Spouse of client is supportive.            Discussed skin care needs of client. Discussed nurse support for client in the home.  Nurse from [facility] visits client in home one time weekly. Client said she will complete  nurse in home visits tomorrow.                Client has sleep difficulty. She spoke of neuropathy issues.                 Spoke of memory challenges. She is concerned about memory issues               Client spoke of diurectic use. She spoke of frequency of bathroom visits               Discussed ambulation of client. She uses a cane as needed to help her walk  Discussed wound care of client. She has wound care for wound site on left leg (slow healing wound). She said she changes dressing daily on wound site                Discussed counseling support of client at mental health agency. She sees counselor one time per month.at [facility]               [name] is hoping to soon begin Outpatient Physical Therapy sessions.               Client spoke of ADHD symptoms.  Client said she is taking Alderall as prescribed. Client spoke of heart issues faced. She has support with cardiologist               Client spoke of ICD implant.  She spoke of ICD shock recently which had been painful. Client is anxious about possible shocks from ICD implant.  She sees cardiologist as scheduled. She said she now has two cardiologists now                Discussed sleep issues of client.                  Client spoke in normal cadence ; she did not speak rapidly .               LCSW used Active Listening techniques to hear client needs and allow her to share her feelings.                 Discussed client work with veterinary surgeon.  Discussed suggestions from counselor for client. Client said she sees counselor one time per month                Client likes to listen to music to relax. She likes watching movies, TV shows, She does have racing thoughts (ADD).                 Client spoke of stress related to current [place]               Client spoke of allowing diversity in [place].                             Encouraged client to call LCSW as needed for SW support.                             Patient Goals/Self-Care Activities:  Client to attend scheduled medical appointments             Client to take medications as prescribed              Client to use coping skills to manage depression issues faced              Client to call LCSW as needed for SW support              Client to participate in scheduled mental health appointments                           Plan:   LCSW to call client on 04/21/2024  at 10:00 AM          Please go to [facility] [address] [place] [employer] [place] (  [phone]) if you are experiencing a Mental Health or Behavioral Health Crisis or need someone to talk to.  Patient verbalized understanding of Care plan and visit instructions communicated this visit   [name]  MSW, LCSW [place] Licensed Clinical Social Worker Direct Dial:  [phone] Fax:  [phone] Website:  [email or web address]

## 2024-03-16 ENCOUNTER — Encounter (HOSPITAL_BASED_OUTPATIENT_CLINIC_OR_DEPARTMENT_OTHER): Payer: Self-pay

## 2024-03-16 ENCOUNTER — Ambulatory Visit (HOSPITAL_BASED_OUTPATIENT_CLINIC_OR_DEPARTMENT_OTHER)

## 2024-03-16 DIAGNOSIS — G8929 Other chronic pain: Secondary | ICD-10-CM

## 2024-03-16 DIAGNOSIS — M6281 Muscle weakness (generalized): Secondary | ICD-10-CM

## 2024-03-16 DIAGNOSIS — M5459 Other low back pain: Secondary | ICD-10-CM

## 2024-03-16 NOTE — Therapy (Signed)
 " OUTPATIENT PHYSICAL THERAPY LOWER EXTREMITY EVALUATION   Patient Name: [name] MRN: [id number] DOB:August 10, 1965, 59 y.o., female Today's Date: 03/16/2024  END OF SESSION:  PT End of Session - 03/16/24 1403     Visit Number 2    Number of Visits 16    Date for Recertification  05/06/24    PT Start Time 1350    PT Stop Time 1430    PT Time Calculation (min) 40 min    Activity Tolerance Patient tolerated treatment well    Behavior During Therapy [facility] for tasks assessed/performed           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/2023   ADD (attention deficit disorder)    Anxiety    Arthritis    both knees right worse than left   Carpal tunnel syndrome of right wrist    Chronic back pain    Dysrhythmia    history of Vtach   Enterococcus faecalis infection 04/29/2023   Essential hypertension, benign    Gallstones    History of smoking 06/22/2016   Hyperlipidemia associated with type 2 diabetes mellitus (HCC), on Zocor  04/04/2011   Hypertension associated with diabetes (HCC) 06/03/2008   ICD (implantable cardioverter-defibrillator) in place 02/24/2024   MDD (major depressive disorder)    Memory impairment    post cardiac arrest   Migraines    Morbid obesity (HCC)    Morbid obesity with BMI of 50.0-59.9, adult (HCC) 07/12/2006   NICM (nonischemic cardiomyopathy) (HCC) 07/12/2006   01/16/18 ECHO:    - Procedure narrative: Transthoracic echocardiography. Image   quality was suboptimal. The study was technically difficult.   Intravenous contrast (Definity ) was administered. - Left ventricle: The cavity size was moderately dilated. Wall   thickness was increased in a pattern of mild LVH. Systolic   function was moderately to severely reduced. 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   Pseudomonas  infection 04/29/2023   Spinal stenosis    back pain   Spondylosis without myelopathy or radiculopathy, lumbar region 12/04/2017   Type 2 diabetes mellitus with hyperglycemia (HCC)    Walker as ambulation aid    Past Surgical History:  Procedure Laterality Date   CHOLECYSTECTOMY     DILATATION & CURETTAGE/HYSTEROSCOPY WITH MYOSURE N/A 10/31/2017   Procedure: DILATATION & CURETTAGE/HYSTEROSCOPY WITH MYOSURE;  Surgeon: Jannis Kate Norris, MD;  Location: [facility];  Service: Gynecology;  Laterality: N/A;   HYSTEROSCOPY WITH D & C N/A 05/07/2022   Procedure: DILATATION AND CURETTAGE /HYSTEROSCOPY;  Surgeon: Jeralyn Crutch, MD;  Location: [facility] OR;  Service: Gynecology;  Laterality: N/A;   ICD IMPLANT N/A 11/25/2023   Procedure: ICD IMPLANT;  Surgeon: Inocencio Soyla Lunger, MD;  Location: [facility] INVASIVE CV LAB;  Service: Cardiovascular;  Laterality: N/A;   INCISION AND DRAINAGE OF WOUND Left 02/06/2023   Procedure: debridement of left leg wound, application of myriad;  Surgeon: Waddell Leonce NOVAK, MD;  Location: [facility] OR;  Service: Plastics;  Laterality: Left;   INCISION AND DRAINAGE OF WOUND Left 02/24/2023   Procedure: application of tissue replacement matrix of left patellar tendon;  Surgeon: Waddell Leonce NOVAK, MD;  Location: [facility] OR;  Service: Plastics;  Laterality: Left;   INCISION AND DRAINAGE OF WOUND Left 04/15/2023   Procedure: irrigation and debridement to left lower extremity  wound;  Surgeon: Waddell Leonce NOVAK, MD;  Location: [facility] OR;  Service: Plastics;  Laterality: Left;   IRRIGATION AND DEBRIDEMENT KNEE  02/24/2023   Procedure: IRRIGATION AND DEBRIDEMENT KNEE;  Surgeon: Waddell Leonce NOVAK, MD;  Location: [facility] OR;  Service: Plastics;;   life vest     RIGHT/LEFT HEART CATH AND CORONARY ANGIOGRAPHY N/A 04/11/2016   Procedure: Right/Left Heart Cath and Coronary Angiography;  Surgeon: Lonni JONETTA Cash, MD;  Location: [facility] INVASIVE CV LAB;  Service: Cardiovascular;  Laterality: N/A;   RIGHT/LEFT  HEART CATH AND CORONARY ANGIOGRAPHY N/A 12/09/2022   Procedure: RIGHT/LEFT HEART CATH AND CORONARY ANGIOGRAPHY;  Surgeon: Ladona Heinz, MD;  Location: [facility] INVASIVE CV LAB;  Service: Cardiovascular;  Laterality: N/A;   sonogram for blood clots     no blockages   Patient Active Problem List   Diagnosis Date Noted   Paroxysmal atrial fibrillation (HCC) 02/24/2024   ICD (implantable cardioverter-defibrillator) in place 02/24/2024   Acquired zinc  deficiency 01/15/2024   Deficiency anemia 01/08/2024   Vaginal discharge 01/08/2024   Painful diabetic neuropathy (HCC) 01/08/2024   Chronic back pain    Knee pain 12/19/2023   COVID-19 11/06/2023   Yeast vaginitis 08/18/2023   Vertigo 08/18/2023   Osteomyelitis of tibia (HCC) 07/15/2023   Actinomyces infection 04/29/2023   Pseudomonas infection 04/29/2023   Enterococcus faecalis infection 04/29/2023   Memory impairment 03/14/2023   Morbid obesity (HCC) 02/18/2023   Penicillin allergy  02/13/2023   Encounter for palliative care involving management of pain 01/28/2023   Need for immunization against influenza 01/15/2023   Diabetes mellitus (HCC) 12/19/2022   Ventricular fibrillation (HCC) 12/10/2022   Onychomycosis 11/15/2022   Anxiety 10/27/2022   Rosacea, acne 08/13/2022   Primary osteoarthritis of both knees 03/06/2022   Bilateral ovarian cysts 03/06/2022   Major depressive disorder, recurrent episode, severe with anxious distress (HCC) 08/09/2021   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   Urge incontinence 08/01/2021   Tinea corporis 03/14/2021   Hyperlipidemia LDL goal <100 03/01/2021   DDD (degenerative disc disease), lumbar 08/08/2020   Non-seasonal allergic rhinitis due to pollen 07/25/2020   Chronic idiopathic constipation 07/25/2020   Visit for screening mammogram 02/11/2020   Chronic bilateral low back pain without sciatica 02/10/2020   Spondylosis without myelopathy or radiculopathy, lumbar region  12/04/2017   Type 2 diabetes mellitus with hyperglycemia (HCC)    OSA on CPAP 10/28/2014   Chronic combined systolic and diastolic heart failure (HCC) 12/06/2013   Hyperlipidemia associated with type 2 diabetes mellitus (HCC), on Zocor  04/04/2011   Hypertension associated with diabetes (HCC) 06/03/2008   Morbid obesity with BMI of 50.0-59.9, adult (HCC) 07/12/2006   NICM (nonischemic cardiomyopathy) (HCC) 07/12/2006   Migraine without status migrainosus, not intractable 07/12/2006    PCP: Debby Molt MD   REFERRING PROVIDER: [name] MD  REFERRING DIAG: \  Diagnosis  M25.561,G89.29 (ICD-10-CM) - Chronic pain of right knee   Diagnosis  M25.512,G89.29 (ICD-10-CM) - Chronic left shoulder pain  M25.511,G89.29 (ICD-10-CM) - Chronic right shoulder pain    THERAPY DIAG:  Chronic pain of left knee  Other low back pain  Muscle weakness (generalized)  Chronic left shoulder pain  Chronic right shoulder pain  Rationale for Evaluation and Treatment: Rehabilitation  ONSET DATE: Began PT treatment 03/10/2024. Original event happened in October of 2024. See subjective.   SUBJECTIVE:   SUBJECTIVE STATEMENT:  I've seen better days. They said the nurse couldn't come to my house and do wound care because I  was coming here.  Eval: Patient was attending physical therapy in the past for back issues back issues and was getting better but than in October suffered a cardiac event  During the emergency response, providers gave her a shunt for her knee however it didn't go in correctly. She developed a wound that caused osteomyelitis and a bacterial infection. She received anti-biotics.  While receiving antibiotics had to be taken off a lot of medication - PT gave pt medication list to review and bring back next session. Pt gets injections for her right knee (OA) and for her feet (hx of plantar fascitis) but doesn't feel benefits last as long as they should. She currently still has an open  wound. She is receiving home health at this time weekly and is being seen 1x a month by an MD for her wound. She was using a scooter for primary mobility but its now using a cane. Her general mobility has declined. She has difficulty doing her hair and showering 2nd to shoulder pain. She developed shoulder pain over the last few months. She feels as though she has a significant amount of muscle atrophy at this time. She feels like she has had a decline in balance since her injury.   * PERTINENT HISTORY: ICD, OA in R knee, LBP, ADD, scoliosis, stenosis, plantar fascitis. Osteomyelitis, major depressive disorder, anxiety, obesity, DMII ( with neuropathy bilateral) anemia PAIN:  Are you having pain? Yes: NPRS scale: na Pain location: multiple regions - shoulder, knee, LBP Pain description: In location of wound - pt compares pain to a boa constrictor squeezing leg due to tightness of new skin. No description of pain given for LBP or shoulder but visible signs and audible ow that hurts when pt reproduced motions.  Aggravating factors: Lifting left shoulder above 85 degrees, kicking left LE out for leg extension. Bending over reproduces LBP.  Relieving factors: None stated.   PRECAUTIONS: Knee, Shoulder, and ICD/Pacemaker  RED FLAGS: Open wound  Yellow flags: depression and chronic pain.  WEIGHT BEARING RESTRICTIONS: No  FALLS:  Has patient fallen in last 6 months? No  LIVING ENVIRONMENT: Lives with: lives with their family and lives with their spouse Has following equipment at home: Quad cane small base  OCCUPATION: None  PLOF: Needs assistance with ADLs, Needs assistance with homemaking, Needs assistance with gait, and Needs assistance with transfers  PATIENT GOALS: To decrease pain, inc strength, and inc balance.    OBJECTIVE:  Note: Objective measures were completed at Evaluation unless otherwise noted.  DIAGNOSTIC FINDINGS:  See imaging section  PATIENT SURVEYS:  Deferred to  next visit due to time constraints  COGNITION: Overall cognitive status: Impaired: Areas of impairment:  Attention: Impaired: Focused, Alternating, Divided Memory: Impaired: Short term     SENSATION: Bilateral peripheral neuropathy   UPPER EXTREMITY ROM:  Active ROM Right eval Left eval  Shoulder flexion 100 80  Shoulder extension    Shoulder abduction    Shoulder adduction    Shoulder extension    Shoulder internal rotation  Cannot reach behind back  Shoulder external rotation  Cannot reach behind head  Elbow flexion    Elbow extension    Wrist flexion    Wrist extension    Wrist ulnar deviation    Wrist radial deviation    Wrist pronation    Wrist supination     (Blank rows = not tested) UE MMT: official MMT not performed due to time constraint however pt stated they can barely  lift a gallon of milk  LOWER EXTREMITY ROM: Deferred due to time and pain  Active ROM Right eval Left eval  Hip flexion    Hip extension    Hip abduction    Hip adduction    Hip internal rotation    Hip external rotation    Knee flexion  Painful and limited   Knee extension    Ankle dorsiflexion    Ankle plantarflexion    Ankle inversion    Ankle eversion     (Blank rows = not tested)  LOWER EXTREMITY MMT:  MMT Right eval Left eval  Hip flexion 11.5 8.5  Hip extension    Hip abduction 19.2 12.8  Hip adduction    Hip internal rotation    Hip external rotation    Knee flexion    Knee extension 13 deferred  Ankle dorsiflexion    Ankle plantarflexion    Ankle inversion    Ankle eversion     (Blank rows = not tested)  GAIT: Distance walked: Lobby to tx room ~20 ft Assistive device utilized: Quad cane small base Level of assistance: Complete Independence Comments: Abnormal gait with leaning towards right side and use of cane. Shifted weight off of left leg.                                                                                                                                  TREATMENT DATE:   1/20  Supine cane flexion x10 LTR 5 2x10 Seated lumbar flexion 3 direction Seated marching (monitored L knee)  HEP update and review    1/14  Manual: Introduced self-STM to patient and showed pt thera-can for upper trap and shoulders   Selfcare: discussed shoulder relaxation techniques and movement patterns under the pain threshold to reduce central nervous system sensitivity.   There-ex: Provided pt ed re: shoulder rolls and upright posture with core engagement.   PATIENT EDUCATION:  Education details: self-STM, calm down activities Person educated: Patient Education method: Medical Illustrator Education comprehension: verbalized understanding and needs further education  HOME EXERCISE PROGRAM: Access Code: URL: [email or web address] Date: 03/16/2024 Prepared by: Asberry Rodes  Exercises - Seated Lumbar Flexion Stretch  - 1-2 x daily - 7 x weekly - 3 sets - 10 reps - 15 seconds hold - Supine Lower Trunk Rotation  - 1-2 x daily - 7 x weekly - 1 sets - 10 reps - Seated March  - 1-2 x daily - 7 x weekly - 2 sets - 10 reps  ASSESSMENT:  CLINICAL IMPRESSION:  Spent time gathering patient information and hx regarding cardiac issues and wound care updates. Pt limited in L shoulder ROM with supine cane flexion. With seated marching, monitored L knee flexion/pain in knee due to ongoing wound. She tolerated partial fwd lumbar flexion well for lumbar extensor stretching. Pt requires increased allotted time for exercise completion to allow for cuing and instruction. Provided pt  with HEP to begin working on with instructions not to push past pain limits. Will monitor wound healing progress. Will continue to progress exercises as tolerated.   Eval: Patient is a 59 y.o. woman who was seen today for physical therapy evaluation and treatment for left knee pain and bilateral shoulder pain.   Pt is complex due to a  variety of conditions and has generalized weakness due to sustained inactivity. Pt will benefit from skilled PT to assist with overall strengthening to the LB, knee, and shoulder. Pt will benefit from therapeutic education regarding chronic pain management. Pt will benefit from balance training. Pt will need weekly checks/management re: wound care.   OBJECTIVE IMPAIRMENTS: Abnormal gait, cardiopulmonary status limiting activity, decreased activity tolerance, decreased balance, decreased cognition, decreased coordination, decreased endurance, decreased mobility, difficulty walking, decreased ROM, decreased strength, impaired perceived functional ability, impaired flexibility, impaired UE functional use, and pain.   ACTIVITY LIMITATIONS: carrying, lifting, bending, sitting, standing, sleeping, transfers, bathing, toileting, dressing, reach over head, hygiene/grooming, and locomotion level  PARTICIPATION LIMITATIONS: cleaning, medication management, interpersonal relationship, driving, community activity, and occupation  PERSONAL FACTORS: Behavior pattern, Past/current experiences, and Time since onset of injury/illness/exacerbation are also affecting patient's functional outcome. Hip and back pain   REHAB POTENTIAL: Fair Pt has a complex case due to variety of conditions/activity limitations.   CLINICAL DECISION MAKING: Unstable 2nd to wound and multiple symptoms effecting mobility   EVALUATION COMPLEXITY:  High    GOALS: Goals reviewed with patient? Yes  SHORT TERM GOALS: Target date: 04/08/2024   Patient will increase gross bilateral LE  strength by 5 lbs  Goal Status: Initial    2.  Therapy will assess BERG balance next visit  Baseline:  Goal status: INITIAL  3.  Patient will ambulate > 500' without significant pain  Baseline:  Goal status: INITIAL  4.  Patient will demonstrate 90 degrees of shoulder flexion without pain  Baseline:  Goal status: INITIAL    LONG TERM GOALS:  Target date: 05/06/2024    Patient will demonstrate 130 degrees of shoulder flexion bilateral in order to perform daily tasks with <3/10 pain  Baseline:  Goal status: INITIAL  2.  Patient will ambulate community distances with limited pain  Baseline:  Goal status: INITIAL  3.  Patient will go up/down 3 steps with reciprocal gait pattern without pain  Baseline:  Goal status: INITIAL  4.  Wound goals to be assessed  Baseline:  Goal status: INITIAL    PLAN:  PT FREQUENCY: 2x/week  PT DURATION: 8 weeks  PLANNED INTERVENTIONS: 97110-Therapeutic exercises, 97530- Therapeutic activity, [id number]- Neuromuscular re-education, 97535- Self Care, [id number]- Manual therapy, [id number]- Gait training, [phone]- Wound care (first 20 sq cm), 97598- Wound care (each additional 20 sq cm), Patient/Family education, Balance training, Stair training, Joint mobilization, Spinal mobilization, and Scar mobilization  PLAN FOR NEXT SESSION: Check on wound and change dressings. Provide pt ed regarding chronic pain management and introduce dynamic activities that will help strengthen multiple regions. Begin gait and balance training. Assess BERG. Wound to be assessed if approved by MD.    Asberry FORBES Rodes, PTA 03/16/2024, 5:18 PM    "

## 2024-03-17 ENCOUNTER — Ambulatory Visit (INDEPENDENT_AMBULATORY_CARE_PROVIDER_SITE_OTHER): Admitting: Mental Health

## 2024-03-17 DIAGNOSIS — F419 Anxiety disorder, unspecified: Secondary | ICD-10-CM

## 2024-03-17 DIAGNOSIS — F332 Major depressive disorder, recurrent severe without psychotic features: Secondary | ICD-10-CM | POA: Diagnosis not present

## 2024-03-17 NOTE — Progress Notes (Unsigned)
" ° °  THERAPIST PROGRESS NOTE  Session Time: 1:04 pm ( 56 minutes)  Participation Level: Active  Behavioral Response: Casual, Neat, and Well GroomedAlertDysphoric and Irritable  Type of Therapy: Individual Therapy  Treatment Goals addressed: STG: To cope. Tyreshia will increase management of moods AEB development of x3 effective coping skills with ability to process thoughts and feelings in balanced manner within the next 90 days.   ProgressTowards Goals: Progressing  Interventions: CBT and Supportive  Summary: Nikoleta Dady is a 59 y.o. female who presents with dx of major depression recurrent severe.  Presents for session alert and oriented; mood and affect adequate; slightly irritable. Shares chief complaint  I am not doing so well. Shares feelings of being overwhelmed secondary to presence of pain and shares presence of medical concerns. Shares thoughts on anxiousness with current county affirms with immigration. Expresses thoughts on feeling things deeply and does not want to be irritable however feels she has difficulty with not being vocal on frustrations. Difficulty identifying any thing that may be going well for her but shares hopeful of ability to be prescribed medication for ADHD. Able to identify means of distraction from pain and state of world affairs with music, watching old t.v shows. Difficulty in working to focus in things that are in her control vs things that are not. Expresses desire for increase session. Denies safety concerns.    Suicidal/Homicidal: Nowithout intent/plan  Therapist Response: Therapist engaged Naliah in therapy session. Assessed for current level of functioning, sxs management and current stressors. Provided safe space to share thoughts and feelings in regards to current stressors. Active empathic listening. Explores current concerns with Shaniece and factors contributing irritable agitated mood. Supported in exploring ability to identify things they may be  going well for her and working to focus on things in which she can control and her reaction to things in which she can not control. Provided supportive feedback; validated feelings. Assessed for means of coping with pain and things in which she can not control and working to do personal good into the world.  Reviewed session and provided follow up.   Plan: Return again in  x 5 weeks.  Diagnosis: Major depressive disorder, recurrent episode, severe with anxious distress (HCC)  Anxiety  Collaboration of Care: Other [facility]  Patient/Guardian was advised Release of Information must be obtained prior to any record release in order to collaborate their care with an outside provider. Patient/Guardian was advised if they have not already done so to contact the registration department to sign all necessary forms in order for us  to release information regarding their care.   Consent: Patient/Guardian gives verbal consent for treatment and assignment of benefits for services provided during this visit. Patient/Guardian expressed understanding and agreed to proceed.   Ty Asal [place], [facility] 03/17/2024  "

## 2024-03-17 NOTE — Telephone Encounter (Signed)
 Paperwork has been completed and faxed

## 2024-03-22 ENCOUNTER — Ambulatory Visit: Admitting: Cardiology

## 2024-03-23 ENCOUNTER — Encounter (HOSPITAL_BASED_OUTPATIENT_CLINIC_OR_DEPARTMENT_OTHER): Payer: Self-pay | Admitting: Physical Therapy

## 2024-03-23 ENCOUNTER — Ambulatory Visit: Admitting: Cardiology

## 2024-03-23 ENCOUNTER — Ambulatory Visit (HOSPITAL_BASED_OUTPATIENT_CLINIC_OR_DEPARTMENT_OTHER): Admitting: Physical Therapy

## 2024-03-23 ENCOUNTER — Encounter: Payer: Self-pay | Admitting: Cardiology

## 2024-03-23 VITALS — BP 118/70 | HR 90 | Ht 64.0 in | Wt 237.0 lb

## 2024-03-23 DIAGNOSIS — R2689 Other abnormalities of gait and mobility: Secondary | ICD-10-CM

## 2024-03-23 DIAGNOSIS — G8929 Other chronic pain: Secondary | ICD-10-CM | POA: Diagnosis not present

## 2024-03-23 DIAGNOSIS — I480 Paroxysmal atrial fibrillation: Secondary | ICD-10-CM | POA: Insufficient documentation

## 2024-03-23 DIAGNOSIS — I4901 Ventricular fibrillation: Secondary | ICD-10-CM | POA: Insufficient documentation

## 2024-03-23 DIAGNOSIS — I428 Other cardiomyopathies: Secondary | ICD-10-CM | POA: Insufficient documentation

## 2024-03-23 DIAGNOSIS — M6281 Muscle weakness (generalized): Secondary | ICD-10-CM

## 2024-03-23 DIAGNOSIS — I5022 Chronic systolic (congestive) heart failure: Secondary | ICD-10-CM | POA: Insufficient documentation

## 2024-03-23 DIAGNOSIS — Z95810 Presence of automatic (implantable) cardiac defibrillator: Secondary | ICD-10-CM | POA: Insufficient documentation

## 2024-03-23 DIAGNOSIS — M5459 Other low back pain: Secondary | ICD-10-CM

## 2024-03-23 LAB — CUP PACEART INCLINIC DEVICE CHECK
Date Time Interrogation Session: 20260127201815
Implantable Lead Connection Status: 753985
Implantable Lead Implant Date: 20250930
Implantable Lead Location: 753860
Implantable Pulse Generator Implant Date: 20250930

## 2024-03-23 NOTE — Progress Notes (Signed)
 " Electrophysiology Office Note:   Date:  03/23/2024  ID:  [name], DOB December 26, 1965, MRN [id number]  Primary Cardiologist: Lynwood Schilling, MD Primary Heart Failure: None Electrophysiologist: Adriauna Campton [name], MD      History of Present Illness:   [name] is a 59 y.o. female with h/o chronic systolic heart failure due to nonischemic cardiomyopathy, sleep apnea, CKD, diabetes, VF arrest seen today for routine electrophysiology follow-up s/p Defibrillator implant.  Discussed the use of AI scribe software for clinical note transcription with the patient, who gave verbal consent to proceed.  History of Present Illness [name] is a 59 year old female with atrial fibrillation who presents with concerns about her heart rhythm and device function.  She experiences episodes of heart fluttering, sometimes accompanied by nausea and visual disturbances, such as seeing 'little dots' in front of her eyes. These sensations are familiar and confusing to her, as she previously thought they were related to anxiety or depression.  She has a history of arrhythmias, including atrial fibrillation and ventricular fibrillation, which led to the implantation of a defibrillator. She experienced a shock from the defibrillator due to a heart rate of 230 beats per minute, triggered by atrial fibrillation. Since then, adjustments have been made to her medications, including the addition of amiodarone  and metoprolol , to prevent atrial fibrillation and manage her heart rhythm.  She is currently taking amiodarone  to maintain normal heart rhythm and metoprolol  at a moderate dose. She is also taking Adderall for ADD, which she has been on since before the defibrillator shock incident. She is concerned about the potential impact of Adderall on her heart condition but notes that it is the only medication that has effectively managed her ADD symptoms.  She describes herself as highly sensitive to pain and  empathetic, feeling overwhelmed by her current condition. She is concerned about the tenderness and soreness she experiences, hoping for improvement over time.  She inquires about the safety of using a massager with a magnet, expressing fear of potential interference with her defibrillator.  she denies chest pain, palpitations, dyspnea, PND, orthopnea, nausea, vomiting, dizziness, syncope, edema, weight gain, or early satiety.    Review of systems complete and found to be negative unless listed in HPI.      EP Information / Studies Reviewed:    EKG is ordered today. Personal review as below.  EKG Interpretation Date/Time:  Tuesday March 23 2024 15:35:53 EST Ventricular Rate:  90 PR Interval:  198 QRS Duration:  102 QT Interval:  388 QTC Calculation: 474 R Axis:   -44  Text Interpretation: Normal sinus rhythm Left axis deviation Low voltage QRS When compared with ECG of 25-Nov-2023 18:35, Premature ventricular complexes are no longer Present Nonspecific T wave abnormality, improved in Lateral leads Confirmed by Kathlee Barnhardt ([id number]) on 03/23/2024 3:53:35 PM   ICD Interrogation-  reviewed in detail today,  See PACEART report.  Device History: Medtronic Single Chamber ICD implanted 11/25/2023 for chronic systolic heart failure and a VF arrest History of appropriate therapy: No History of AAD therapy: Yes; currently on amiodarone    Risk Assessment/Calculations:           Physical Exam:   VS:  BP 118/70 (BP Location: Left Arm, Patient Position: Sitting, Cuff Size: Large)   Pulse 90   Ht 5' 4 (1.626 m)   Wt 237 lb (107.5 kg)   LMP  (LMP Unknown)   SpO2 95%   BMI 40.68 kg/m  Wt Readings from Last 3 Encounters:  03/23/24 237 lb (107.5 kg)  02/24/24 237 lb (107.5 kg)  02/11/24 234 lb (106.1 kg)     GEN: Well nourished, well developed in no acute distress NECK: No JVD; No carotid bruits CARDIAC: Regular rate and rhythm, no murmurs, rubs, gallops RESPIRATORY:  Clear to  auscultation without rales, wheezing or rhonchi  ABDOMEN: Soft, non-tender, non-distended EXTREMITIES:  No edema; No deformity   ASSESSMENT AND PLAN:    Chronic systolic dysfunction with out-of-hospital VF arrest s/p Medtronic single chamber ICD  euvolemic today Stable on an appropriate medical regimen Normal ICD function See Pace Art report No changes today  2.  High risk medication monitoring: On amiodarone .  TSH and LFTs stable.  3.  Obstructive sleep apnea: CPAP compliance encouraged  4.  Paroxysmal atrial fibrillation: Received inappropriate ICD shock.  Her metoprolol  dose was increased and she was started on amiodarone .  She has had no further episodes since starting these medications.  [name] continue with current management.  5.  Secondary hypercoagulable state: On Eliquis   Disposition:   Follow up with EP Team in 6 months   Signed, Rosealyn Little [name], MD  "

## 2024-03-23 NOTE — Patient Instructions (Signed)
 Medication Instructions:  Your physician recommends that you continue on your current medications as directed. Please refer to the Current Medication list given to you today.  *If you need a refill on your cardiac medications before your next appointment, please call your pharmacy*  Follow-Up: At [facility], you and your health needs are our priority.  As part of our continuing mission to provide you with exceptional heart care, our providers are all part of one team.  This team includes your primary Cardiologist (physician) and Advanced Practice Providers or APPs (Physician Assistants and Nurse Practitioners) who all work together to provide you with the care you need, when you need it.  Your next appointment:   6 months   Provider:   You will see one of the following Advanced Practice Providers on your designated Care Team:   Charlies Arthur, [place] Ozell Jodie Passey, PA-C Suzann Riddle, NP Daphne Barrack, NP Artist Pouch, PA-C  Odis Phoenix, PA-C

## 2024-03-23 NOTE — Therapy (Unsigned)
 " OUTPATIENT PHYSICAL THERAPY LOWER EXTREMITY EVALUATION   Patient Name: [name] MRN: [id number] DOB:Mar 14, 1965, 59 y.o., female Today's Date: 03/24/2024  END OF SESSION:  PT End of Session - 03/23/24 1357     Visit Number 3    Number of Visits 16    Date for Recertification  05/06/24    PT Start Time 1356   Pt was late due to weather conditions   PT Stop Time 1430    PT Time Calculation (min) 34 min    Activity Tolerance Patient tolerated treatment well    Behavior During Therapy [facility] for tasks assessed/performed            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/2023   ADD (attention deficit disorder)    Anxiety    Arthritis    both knees right worse than left   Carpal tunnel syndrome of right wrist    Chronic back pain    Dysrhythmia    history of Vtach   Enterococcus faecalis infection 04/29/2023   Essential hypertension, benign    Gallstones    History of smoking 06/22/2016   Hyperlipidemia associated with type 2 diabetes mellitus (HCC), on Zocor  04/04/2011   Hypertension associated with diabetes (HCC) 06/03/2008   ICD (implantable cardioverter-defibrillator) in place 02/24/2024   MDD (major depressive disorder)    Memory impairment    post cardiac arrest   Migraines    Morbid obesity (HCC)    Morbid obesity with BMI of 50.0-59.9, adult (HCC) 07/12/2006   NICM (nonischemic cardiomyopathy) (HCC) 07/12/2006   01/16/18 ECHO:    - Procedure narrative: Transthoracic echocardiography. Image   quality was suboptimal. The study was technically difficult.   Intravenous contrast (Definity ) was administered. - Left ventricle: The cavity size was moderately dilated. Wall   thickness was increased in a pattern of mild LVH. Systolic   function was moderately to severely reduced. The estimated   ejection fraction w   Normal coronary arteries 04/17/2016   OSA on CPAP 10/28/2014   does not use CPAP- pt cannot tolerate CPAP   Osteomyelitis of  tibia (HCC) 07/15/2023   Pseudomonas infection 04/29/2023   Spinal stenosis    back pain   Spondylosis without myelopathy or radiculopathy, lumbar region 12/04/2017   Type 2 diabetes mellitus with hyperglycemia (HCC)    Walker as ambulation aid    Past Surgical History:  Procedure Laterality Date   CHOLECYSTECTOMY     DILATATION & CURETTAGE/HYSTEROSCOPY WITH MYOSURE N/A 10/31/2017   Procedure: DILATATION & CURETTAGE/HYSTEROSCOPY WITH MYOSURE;  Surgeon: Jannis Kate Norris, MD;  Location: [facility] ORS;  Service: Gynecology;  Laterality: N/A;   HYSTEROSCOPY WITH D & C N/A 05/07/2022   Procedure: DILATATION AND CURETTAGE /HYSTEROSCOPY;  Surgeon: Jeralyn Crutch, MD;  Location: [facility] OR;  Service: Gynecology;  Laterality: N/A;   ICD IMPLANT N/A 11/25/2023   Procedure: ICD IMPLANT;  Surgeon: Inocencio Soyla Lunger, MD;  Location: [facility] INVASIVE CV LAB;  Service: Cardiovascular;  Laterality: N/A;   INCISION AND DRAINAGE OF WOUND Left 02/06/2023   Procedure: debridement of left leg wound, application of myriad;  Surgeon: Waddell Leonce NOVAK, MD;  Location: [facility] OR;  Service: Plastics;  Laterality: Left;   INCISION AND DRAINAGE OF WOUND Left 02/24/2023   Procedure: application of tissue replacement matrix of left patellar tendon;  Surgeon: Waddell Leonce NOVAK, MD;  Location: [facility] OR;  Service: Plastics;  Laterality: Left;   INCISION AND DRAINAGE OF WOUND Left 04/15/2023  Procedure: irrigation and debridement to left lower extremity wound;  Surgeon: Waddell Leonce NOVAK, MD;  Location: [facility] OR;  Service: Plastics;  Laterality: Left;   IRRIGATION AND DEBRIDEMENT KNEE  02/24/2023   Procedure: IRRIGATION AND DEBRIDEMENT KNEE;  Surgeon: Waddell Leonce NOVAK, MD;  Location: [facility] OR;  Service: Plastics;;   life vest     RIGHT/LEFT HEART CATH AND CORONARY ANGIOGRAPHY N/A 04/11/2016   Procedure: Right/Left Heart Cath and Coronary Angiography;  Surgeon: Lonni JONETTA Cash, MD;  Location: [facility] INVASIVE CV LAB;  Service:  Cardiovascular;  Laterality: N/A;   RIGHT/LEFT HEART CATH AND CORONARY ANGIOGRAPHY N/A 12/09/2022   Procedure: RIGHT/LEFT HEART CATH AND CORONARY ANGIOGRAPHY;  Surgeon: Ladona Heinz, MD;  Location: [facility] INVASIVE CV LAB;  Service: Cardiovascular;  Laterality: N/A;   sonogram for blood clots     no blockages   Patient Active Problem List   Diagnosis Date Noted   Paroxysmal atrial fibrillation (HCC) 02/24/2024   ICD (implantable cardioverter-defibrillator) in place 02/24/2024   Acquired zinc  deficiency 01/15/2024   Deficiency anemia 01/08/2024   Vaginal discharge 01/08/2024   Painful diabetic neuropathy (HCC) 01/08/2024   Chronic back pain    Knee pain 12/19/2023   COVID-19 11/06/2023   Yeast vaginitis 08/18/2023   Vertigo 08/18/2023   Osteomyelitis of tibia (HCC) 07/15/2023   Actinomyces infection 04/29/2023   Pseudomonas infection 04/29/2023   Enterococcus faecalis infection 04/29/2023   Memory impairment 03/14/2023   Morbid obesity (HCC) 02/18/2023   Penicillin allergy  02/13/2023   Encounter for palliative care involving management of pain 01/28/2023   Need for immunization against influenza 01/15/2023   Diabetes mellitus (HCC) 12/19/2022   Ventricular fibrillation (HCC) 12/10/2022   Onychomycosis 11/15/2022   Anxiety 10/27/2022   Rosacea, acne 08/13/2022   Primary osteoarthritis of both knees 03/06/2022   Bilateral ovarian cysts 03/06/2022   Major depressive disorder, recurrent episode, severe with anxious distress (HCC) 08/09/2021   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   Urge incontinence 08/01/2021   Tinea corporis 03/14/2021   Hyperlipidemia LDL goal <100 03/01/2021   DDD (degenerative disc disease), lumbar 08/08/2020   Non-seasonal allergic rhinitis due to pollen 07/25/2020   Chronic idiopathic constipation 07/25/2020   Visit for screening mammogram 02/11/2020   Chronic bilateral low back pain without sciatica 02/10/2020   Spondylosis without  myelopathy or radiculopathy, lumbar region 12/04/2017   Type 2 diabetes mellitus with hyperglycemia (HCC)    OSA on CPAP 10/28/2014   Chronic combined systolic and diastolic heart failure (HCC) 12/06/2013   Hyperlipidemia associated with type 2 diabetes mellitus (HCC), on Zocor  04/04/2011   Hypertension associated with diabetes (HCC) 06/03/2008   Morbid obesity with BMI of 50.0-59.9, adult (HCC) 07/12/2006   NICM (nonischemic cardiomyopathy) (HCC) 07/12/2006   Migraine without status migrainosus, not intractable 07/12/2006    PCP: Debby Molt MD   REFERRING PROVIDER: [name] MD  REFERRING DIAG: \  Diagnosis  M25.561,G89.29 (ICD-10-CM) - Chronic pain of right knee   Diagnosis  M25.512,G89.29 (ICD-10-CM) - Chronic left shoulder pain  M25.511,G89.29 (ICD-10-CM) - Chronic right shoulder pain    THERAPY DIAG:  Other low back pain  Muscle weakness (generalized)  Chronic left shoulder pain  Other abnormalities of gait and mobility  Rationale for Evaluation and Treatment: Rehabilitation  ONSET DATE: Began PT treatment 03/10/2024. Original event happened in October of 2024. See subjective.   SUBJECTIVE:   SUBJECTIVE STATEMENT:  Pt states that she's in pain everywhere today. Primarily her left shoulder is bothering her  and her neuropathy is really bothering her. She ses her wound care MD next week.   Eval: Patient was attending physical therapy in the past for back issues back issues and was getting better but than in October suffered a cardiac event  During the emergency response, providers gave her a shunt for her knee however it didn't go in correctly. She developed a wound that caused osteomyelitis and a bacterial infection. She received anti-biotics.  While receiving antibiotics had to be taken off a lot of medication - PT gave pt medication list to review and bring back next session. Pt gets injections for her right knee (OA) and for her feet (hx of plantar fascitis) but  doesn't feel benefits last as long as they should. She currently still has an open wound. She is receiving home health at this time weekly and is being seen 1x a month by an MD for her wound. She was using a scooter for primary mobility but its now using a cane. Her general mobility has declined. She has difficulty doing her hair and showering 2nd to shoulder pain. She developed shoulder pain over the last few months. She feels as though she has a significant amount of muscle atrophy at this time. She feels like she has had a decline in balance since her injury.   * PERTINENT HISTORY: ICD, OA in R knee, LBP, ADD, scoliosis, stenosis, plantar fascitis. Osteomyelitis, major depressive disorder, anxiety, obesity, DMII ( with neuropathy bilateral) anemia PAIN:  Are you having pain? Yes: NPRS scale: na Pain location: multiple regions - shoulder, knee, LBP Pain description: In location of wound - pt compares pain to a boa constrictor squeezing leg due to tightness of new skin. No description of pain given for LBP or shoulder but visible signs and audible ow that hurts when pt reproduced motions.  Aggravating factors: Lifting left shoulder above 85 degrees, kicking left LE out for leg extension. Bending over reproduces LBP.  Relieving factors: None stated.   PRECAUTIONS: Knee, Shoulder, and ICD/Pacemaker  RED FLAGS: Open wound  Yellow flags: depression and chronic pain.  WEIGHT BEARING RESTRICTIONS: No  FALLS:  Has patient fallen in last 6 months? No  LIVING ENVIRONMENT: Lives with: lives with their family and lives with their spouse Has following equipment at home: Quad cane small base  OCCUPATION: None  PLOF: Needs assistance with ADLs, Needs assistance with homemaking, Needs assistance with gait, and Needs assistance with transfers  PATIENT GOALS: To decrease pain, inc strength, and inc balance.    OBJECTIVE:  Note: Objective measures were completed at Evaluation unless otherwise  noted.  DIAGNOSTIC FINDINGS:  See imaging section  PATIENT SURVEYS:  Deferred to next visit due to time constraints  COGNITION: Overall cognitive status: Impaired: Areas of impairment:  Attention: Impaired: Focused, Alternating, Divided Memory: Impaired: Short term     SENSATION: Bilateral peripheral neuropathy   UPPER EXTREMITY ROM:  Active ROM Right eval Left eval  Shoulder flexion 100 80  Shoulder extension    Shoulder abduction    Shoulder adduction    Shoulder extension    Shoulder internal rotation  Cannot reach behind back  Shoulder external rotation  Cannot reach behind head  Elbow flexion    Elbow extension    Wrist flexion    Wrist extension    Wrist ulnar deviation    Wrist radial deviation    Wrist pronation    Wrist supination     (Blank rows = not tested) UE MMT: official  MMT not performed due to time constraint however pt stated they can barely lift a gallon of milk  LOWER EXTREMITY ROM: Deferred due to time and pain  Active ROM Right eval Left eval  Hip flexion    Hip extension    Hip abduction    Hip adduction    Hip internal rotation    Hip external rotation    Knee flexion  Painful and limited  ~90  Knee extension    Ankle dorsiflexion    Ankle plantarflexion    Ankle inversion    Ankle eversion     (Blank rows = not tested)  LOWER EXTREMITY MMT:  MMT Right eval Left eval  Hip flexion 11.5 8.5  Hip extension    Hip abduction 19.2 12.8  Hip adduction    Hip internal rotation    Hip external rotation    Knee flexion    Knee extension 13 deferred  Ankle dorsiflexion    Ankle plantarflexion    Ankle inversion    Ankle eversion     (Blank rows = not tested)  GAIT: Distance walked: Lobby to tx room ~20 ft Assistive device utilized: Quad cane small base Level of assistance: Complete Independence Comments: Abnormal gait with leaning towards right side and use of cane. Shifted weight off of left leg.                                                                                                                                  TREATMENT DATE:  1/27 Manual: PROM into knee flexion  TP massage to left shoulder - subscap   There-ex: Wand pushes into ER 3x10 Wand press limited range (1/2 straighten) 2x10 Seated ABD RTB 2x10  Discussed movement in pain free ranges   Neuro-re-ed: Weight bearing/shifting with scales required mod cuing for equal weaight bearing.   Pt ed: How back pain is related to weakness in left leg and gait imbalance   1/20  Supine cane flexion x10 LTR 5 2x10 Seated lumbar flexion 3 direction Seated marching (monitored L knee)  HEP update and review    1/14  Manual: Introduced self-STM to patient and showed pt thera-can for upper trap and shoulders   Selfcare: discussed shoulder relaxation techniques and movement patterns under the pain threshold to reduce central nervous system sensitivity.   There-ex: Provided pt ed re: shoulder rolls and upright posture with core engagement.   PATIENT EDUCATION:  Education details: self-STM, calm down activities Person educated: Patient Education method: Medical Illustrator Education comprehension: verbalized understanding and needs further education  HOME EXERCISE PROGRAM: Access Code: URL: [email or web address] Date: 03/16/2024 Prepared by: Asberry Rodes  Exercises - Seated Lumbar Flexion Stretch  - 1-2 x daily - 7 x weekly - 3 sets - 10 reps - 15 seconds hold - Supine Lower Trunk Rotation  - 1-2 x daily - 7 x weekly - 1 sets - 10 reps - Seated  March  - 1-2 x daily - 7 x weekly - 2 sets - 10 reps  ASSESSMENT:  CLINICAL IMPRESSION:  Pt limited in L shoulder ROM and pain. PT focused on reducing inflammation in shoulder and providing TP massage to help manage tightness. Pt required moderate cueing during exercises to not push herself into a painful range or do too many reps. PT also worked on [employer]  shifting with the scales to give them visual feedback on how much their leg is compensating. Pt tolerated WB shifting well. Pt will benefit from continued PT and pt ed on pain and activity modification.   Eval: Patient is a 59 y.o. woman who was seen today for physical therapy evaluation and treatment for left knee pain and bilateral shoulder pain.   Pt is complex due to a variety of conditions and has generalized weakness due to sustained inactivity. Pt will benefit from skilled PT to assist with overall strengthening to the LB, knee, and shoulder. Pt will benefit from therapeutic education regarding chronic pain management. Pt will benefit from balance training. Pt will need weekly checks/management re: wound care.   OBJECTIVE IMPAIRMENTS: Abnormal gait, cardiopulmonary status limiting activity, decreased activity tolerance, decreased balance, decreased cognition, decreased coordination, decreased endurance, decreased mobility, difficulty walking, decreased ROM, decreased strength, impaired perceived functional ability, impaired flexibility, impaired UE functional use, and pain.   ACTIVITY LIMITATIONS: carrying, lifting, bending, sitting, standing, sleeping, transfers, bathing, toileting, dressing, reach over head, hygiene/grooming, and locomotion level  PARTICIPATION LIMITATIONS: cleaning, medication management, interpersonal relationship, driving, community activity, and occupation  PERSONAL FACTORS: Behavior pattern, Past/current experiences, and Time since onset of injury/illness/exacerbation are also affecting patient's functional outcome. Hip and back pain   REHAB POTENTIAL: Fair Pt has a complex case due to variety of conditions/activity limitations.   CLINICAL DECISION MAKING: Unstable 2nd to wound and multiple symptoms effecting mobility   EVALUATION COMPLEXITY:  High    GOALS: Goals reviewed with patient? Yes  SHORT TERM GOALS: Target date: 04/08/2024   Patient will increase  gross bilateral LE  strength by 5 lbs  Goal Status:added in strengthening exercises 1/27   2.  Therapy will assess BERG balance next visit  Baseline:  Goal status: INITIAL  3.  Patient will ambulate > 500' without significant pain  Baseline:  Goal status: INITIAL  4.  Patient will demonstrate 90 degrees of shoulder flexion without pain  Baseline:  Goal status: INITIAL    LONG TERM GOALS: Target date: 05/06/2024    Patient will demonstrate 130 degrees of shoulder flexion bilateral in order to perform daily tasks with <3/10 pain  Baseline:  Goal status: INITIAL  2.  Patient will ambulate community distances with limited pain  Baseline:  Goal status: INITIAL  3.  Patient will go up/down 3 steps with reciprocal gait pattern without pain  Baseline:  Goal status: INITIAL  4.  Wound goals to be assessed  Baseline:  Goal status: INITIAL    PLAN:  PT FREQUENCY: 2x/week  PT DURATION: 8 weeks  PLANNED INTERVENTIONS: 97110-Therapeutic exercises, 97530- Therapeutic activity, [id number]- Neuromuscular re-education, 97535- Self Care, [id number]- Manual therapy, [id number]- Gait training, [phone]- Wound care (first 20 sq cm), 97598- Wound care (each additional 20 sq cm), Patient/Family education, Balance training, Stair training, Joint mobilization, Spinal mobilization, and Scar mobilization  PLAN FOR NEXT SESSION: Check on wound and change dressings. Provide pt ed regarding chronic pain management and introduce dynamic activities that will help strengthen multiple regions. Begin gait and balance training. Assess BERG.  Wound to be assessed if approved by MD.    Alm JINNY Don, PT 03/24/2024, 8:33 AM    "

## 2024-03-24 ENCOUNTER — Encounter (HOSPITAL_BASED_OUTPATIENT_CLINIC_OR_DEPARTMENT_OTHER): Admitting: Internal Medicine

## 2024-03-24 ENCOUNTER — Encounter (HOSPITAL_BASED_OUTPATIENT_CLINIC_OR_DEPARTMENT_OTHER): Payer: Self-pay | Admitting: Physical Therapy

## 2024-03-25 ENCOUNTER — Encounter (HOSPITAL_BASED_OUTPATIENT_CLINIC_OR_DEPARTMENT_OTHER): Attending: Internal Medicine | Admitting: Internal Medicine

## 2024-03-25 ENCOUNTER — Ambulatory Visit: Payer: Self-pay | Admitting: Cardiology

## 2024-03-25 DIAGNOSIS — T798XXA Other early complications of trauma, initial encounter: Secondary | ICD-10-CM | POA: Insufficient documentation

## 2024-03-25 DIAGNOSIS — I252 Old myocardial infarction: Secondary | ICD-10-CM | POA: Diagnosis not present

## 2024-03-25 DIAGNOSIS — T8140XA Infection following a procedure, unspecified, initial encounter: Secondary | ICD-10-CM | POA: Insufficient documentation

## 2024-03-25 DIAGNOSIS — L97828 Non-pressure chronic ulcer of other part of left lower leg with other specified severity: Secondary | ICD-10-CM | POA: Insufficient documentation

## 2024-03-25 NOTE — Progress Notes (Signed)
 " Cardiology Office Note:   Date:  03/26/2024  ID:  [name], DOB Feb 08, 1966, MRN [id number] PCP: Joshua Debby CROME, MD  [facility] Providers Cardiologist:  Lynwood Schilling, MD Electrophysiologist:  Will [name], MD {  History of Present Illness:   [name] is a 59 y.o. female with a history of a nonischemic cardiomyopathy.  She had cardiac arrest on October 10.  This was a V-fib arrest requiring ventilator support with prolonged resuscitation.  She had some short-term memory deficits but actually recovered remarkably well following this.  Unfortunately she had an intraosseous IV in the field and got cellulitis related to this with a full-thickness wound that has required plastic surgery for debridement.  Because of that she did not get an ICD and was sent home on a LifeVest while this wound is healing.  She did have a cardiac catheterization as part of her evaluation.  This demonstrated normal coronaries.  MRI demonstrated an EF of 31%.  Her echocardiogram demonstrated an EF to be 30 to 35%.  There were no other abnormal findings.  She has had follow-up in the wound clinic.  She has been followed in the [facility] and in EP.   She is now status post ICD.  Unfortunately she had a firing of her ICD because of atrial fib with rapid rate.  She had her beta blocker dose increased.  She is also on amiodarone .     She has no acute cardiac complaints.  The patient denies any new symptoms such as chest discomfort, neck or arm discomfort. There has been no new shortness of breath, PND or orthopnea. There have been no reported palpitations, presyncope or syncope.  Her biggest complaint still centered around anxiety, depression.  She is also having to get around a little bit in a wheelchair because of back pain that she is going to have addressed.  She complains of neuropathy.  ROS: As stated in the HPI and negative for all other systems.  Studies Reviewed:    EKG:     NA  Risk  Assessment/Calculations:    CHA2DS2-VASc Score = 4   This indicates a 4.8% annual risk of stroke. The patient's score is based upon: CHF History: 1 HTN History: 1 Diabetes History: 1 Stroke History: 0 Vascular Disease History: 0 Age Score: 0 Gender Score: 1  Physical Exam:   VS:  BP 115/80 (BP Location: Right Arm, Patient Position: Sitting, Cuff Size: Large)   Pulse 85   Ht 5' 3 (1.6 m)   Wt 235 lb 12.8 oz (107 kg)   LMP  (LMP Unknown)   SpO2 98%   BMI 41.77 kg/m    Wt Readings from Last 3 Encounters:  03/26/24 235 lb 12.8 oz (107 kg)  03/23/24 237 lb (107.5 kg)  02/24/24 237 lb (107.5 kg)     GEN: Well nourished, well developed in no acute distress NECK: No JVD; No carotid bruits CARDIAC: RRR, no murmurs, rubs, gallops RESPIRATORY:  Clear to auscultation without rales, wheezing or rhonchi  ABDOMEN: Soft, non-tender, non-distended EXTREMITIES:  No edema; No deformity   ASSESSMENT AND PLAN:   Aborted Cardiac Arrest  s/p Medtronic single chamber ICD : She has had her device interrogated and saw EP the other day.  No change in therapy.  Nonischemic cardiomyopathy: She seems to be euvolemic.  She is on optimal medical therapy.  No change in therapy.  Paroxysmal atrial fibrillation: She tolerates anticoagulation.  I will talk  to EP about whether ablation is possible in her future.  For now she will continue on the amiodarone  though I would like to eventually try to get her off of this.   Obstructive sleep apnea: She is not tolerating her CPAP.  Her weight would not allow for Inspire.  I will consider repeat sleep apnea referral and possible oral appliance in the future.  Hypertension:   This is being managed in the context of treating his CHF Blood pressure is well-controlled on current antihypertensive regimen. Continue current medications and dosing.    ADD: We had a long discussion in the past about continuing the stimulant.  She really has a poor quality of life without  this.  It certainly may well be contributing to tach arrhythmia but I think this is controllable with therapy as above and I would still be okay with the use of this per patient activation conversation.  Follow up with me in 3 months.    Signed, Lynwood Schilling, MD   "

## 2024-03-26 ENCOUNTER — Encounter: Payer: Self-pay | Admitting: Cardiology

## 2024-03-26 ENCOUNTER — Ambulatory Visit: Attending: Cardiology | Admitting: Cardiology

## 2024-03-26 ENCOUNTER — Encounter (HOSPITAL_COMMUNITY): Payer: Self-pay | Admitting: Physician Assistant

## 2024-03-26 VITALS — BP 115/80 | HR 85 | Ht 63.0 in | Wt 235.8 lb

## 2024-03-26 DIAGNOSIS — I480 Paroxysmal atrial fibrillation: Secondary | ICD-10-CM | POA: Insufficient documentation

## 2024-03-26 DIAGNOSIS — I428 Other cardiomyopathies: Secondary | ICD-10-CM | POA: Insufficient documentation

## 2024-03-26 DIAGNOSIS — Z95810 Presence of automatic (implantable) cardiac defibrillator: Secondary | ICD-10-CM | POA: Insufficient documentation

## 2024-03-26 NOTE — Patient Instructions (Signed)
 Medication Instructions:  Your physician recommends that you continue on your current medications as directed. Please refer to the Current Medication list given to you today.  *If you need a refill on your cardiac medications before your next appointment, please call your pharmacy*  Lab Work: NONE If you have labs (blood work) drawn today and your tests are completely normal, you will receive your results only by: MyChart Message (if you have MyChart) OR A paper copy in the mail If you have any lab test that is abnormal or we need to change your treatment, we will call you to review the results.  Testing/Procedures: NONE  Follow-Up: At [facility], you and your health needs are our priority.  As part of our continuing mission to provide you with exceptional heart care, our providers are all part of one team.  This team includes your primary Cardiologist (physician) and Advanced Practice Providers or APPs (Physician Assistants and Nurse Practitioners) who all work together to provide you with the care you need, when you need it.  Your next appointment:   3 month(s)  Provider:   Eilleen Grates, MD    We recommend signing up for the patient portal called "MyChart".  Sign up information is provided on this After Visit Summary.  MyChart is used to connect with patients for Virtual Visits (Telemedicine).  Patients are able to view lab/test results, encounter notes, upcoming appointments, etc.  Non-urgent messages can be sent to your provider as well.   To learn more about what you can do with MyChart, go to [email or web address].

## 2024-03-26 NOTE — Progress Notes (Cosign Needed)
 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  Patient is not currently being managed on any psychiatric medications at this time.  Patient presents to the encounter continuing to show interest in being placed on a stimulant for the management of her attention deficit disorder.  Provider reminded patient of the dangers of stimulant use within individuals with a history of cardiomyopathy.  Patient reports that her heart is monitored daily and she gets her blood pressure monitored at least once a week by her nurse.  Prior to the encounter, provider reached out to patient's cardiologist (Dr. Lavona) about whether or not patient should be placed on Adderall given her past history of cardiomyopathy.  Patient's cardiologist gave permission for the patient to take Adderall for the management of her attention deficit disorder.  Patient endorses being very angry and anxious.  She states that she still continues to be overly distracted.  Patient endorses stressors related to politics and the social political landscape.  She reports that she tries to avoid politics so that she is not overly stressed.  Patient reports that she is unsure of her current issues but believes that what ever is wrong with her affects her whole body and heart.  Patient endorses not wanting to wake up in the morning but denies suicidal ideations.  Patient reports that she does not understand why she is not being heard or being taken care of.  Patient endorses depression and rates  her depression an 8-9 out of 10 with 10 being most severe.  Patient endorses depressive episodes every day.  Patient endorses the following depressive symptoms: feelings of sadness, crying spells, lack of motivation, decreased concentration, decreased energy, irritability, feelings of guilt/worthlessness, and hopelessness.  Patient also endorses anxiety and rates her anxiety a 9-1/2 out of 10.  Patient's current stressors revolve around her ongoing pain and undergoing wound care regularly.  [place]  A PHQ-9 screen was performed with the patient scoring a 23.  A GAD-7 screen was also performed with the patient scoring a 17.  Patient is alert and oriented x 4, calm, cooperative, and fully engaged in conversation during the encounter.  Patient describes her mood as annoyed, pissed, angry, and hopeless.  Patient exhibits depressed mood with congruent affect.  Patient denies suicidal or homicidal ideations.  She further denies auditory or visual hallucinations and does not appear to be responding to internal/external stimuli.  Patient endorses poor sleep and receives on average 2 to 3 hours of sleep per night.  Patient states that her sleep is sporadic and often wakes up throughout the night.  Patient endorses good appetite and eats on average 2-3 meals per day.  Patient denies alcohol consumption, tobacco use, or illicit drug use.  Visit Diagnosis:    ICD-10-CM   1. ADD (attention deficit disorder) without hyperactivity  F98.8 amphetamine -dextroamphetamine  (ADDERALL) 10 MG tablet    amphetamine -dextroamphetamine  (ADDERALL) 15 MG tablet    2. Major depressive disorder, recurrent episode, severe with anxious distress (HCC)  F33.2     3. Anxiety  F41.9       Past Psychiatric History:  Patient has a past psychiatric history significant for Major depressive disorder, anxiety, possible OCD, and ADD.   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 Image   quality was suboptimal. The study was technically difficult.   Intravenous contrast (Definity ) was administered. - Left ventricle: The cavity size was moderately dilated. Wall   thickness was increased in a pattern of mild LVH. Systolic   function was moderately to severely reduced. 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   Pseudomonas infection 04/29/2023   Spinal stenosis    back pain   Spondylosis without myelopathy or radiculopathy, lumbar region 12/04/2017   Type 2 diabetes mellitus with hyperglycemia (HCC)    Walker as ambulation aid     Past Surgical History:  Procedure Laterality Date   CHOLECYSTECTOMY     DILATATION & CURETTAGE/HYSTEROSCOPY WITH MYOSURE N/A 10/31/2017   Procedure: DILATATION & CURETTAGE/HYSTEROSCOPY WITH MYOSURE;  Surgeon: Jannis Kate Norris, MD;  Location: [facility];  Service: Gynecology;  Laterality: N/A;   HYSTEROSCOPY WITH D & C N/A  05/07/2022   Procedure: DILATATION AND CURETTAGE /HYSTEROSCOPY;  Surgeon: Jeralyn Crutch, MD;  Location: [facility] OR;  Service: Gynecology;  Laterality: N/A;   ICD IMPLANT N/A 11/25/2023   Procedure: ICD IMPLANT;  Surgeon: Inocencio Soyla Lunger, MD;  Location: [facility] INVASIVE CV LAB;  Service: Cardiovascular;  Laterality: N/A;   INCISION AND DRAINAGE OF WOUND Left 02/06/2023   Procedure: debridement of left leg wound, application of myriad;  Surgeon: Waddell Leonce NOVAK, MD;  Location: [facility] OR;  Service: Plastics;  Laterality: Left;   INCISION AND DRAINAGE OF WOUND Left 02/24/2023   Procedure: application of tissue replacement matrix of left patellar tendon;  Surgeon: Waddell Leonce NOVAK, MD;  Location: [facility] OR;  Service: Plastics;  Laterality: Left;   INCISION AND DRAINAGE OF WOUND Left 04/15/2023   Procedure: irrigation and debridement to left lower extremity wound;  Surgeon: Waddell Leonce NOVAK, MD;  Location: [facility] OR;  Service: Plastics;  Laterality: Left;   IRRIGATION AND DEBRIDEMENT KNEE  02/24/2023   Procedure: IRRIGATION AND DEBRIDEMENT KNEE;  Surgeon: Waddell Leonce NOVAK, MD;  Location: [facility] OR;  Service: Plastics;;   life vest     RIGHT/LEFT HEART CATH AND CORONARY ANGIOGRAPHY N/A 04/11/2016   Procedure: Right/Left Heart Cath and Coronary Angiography;  Surgeon: Lonni BIRCH  Verlin, MD;  Location: [facility] INVASIVE CV LAB;  Service: Cardiovascular;  Laterality: N/A;   RIGHT/LEFT HEART CATH AND CORONARY ANGIOGRAPHY N/A 12/09/2022   Procedure: RIGHT/LEFT HEART CATH AND CORONARY ANGIOGRAPHY;  Surgeon: Ladona Heinz, MD;  Location: [facility] INVASIVE CV LAB;  Service: Cardiovascular;  Laterality: N/A;   sonogram for blood clots     no blockages    Family Psychiatric History:  Patient reports that her mother had several psych issues including depression, anxiety, and possible OCD Father - OCD Son - Asperger's, ADD Grandmother (ma) - depression, anxiety. History of some form of electric/stimulation therapy 2 Uncles -  depression, anxiety 2nd cousins - OCD, ADD, depression, anxiety  Family History:  Family History  Problem Relation Age of Onset   Depression Mother    Anxiety disorder Mother    Diabetes Mother    Hypertension Mother    ADD / ADHD Father    Diabetes Father    Hypertension Father    Colitis Father    Hypertension Other    Diabetes Other    Colitis Other    Alcohol abuse Other    Breast cancer Neg Hx    BRCA 1/2 Neg Hx     Social History:  Social History   Socioeconomic History   Marital status: Married    Spouse name: [name]   Number of children: 1   Years of education: 16   Highest education level: Associate degree: academic program  Occupational History   Not on file  Tobacco Use   Smoking status: Former    Current packs/day: 0.00    Types: Cigarettes    Quit date: 02/25/1997    Years since quitting: 27.0   Smokeless tobacco: Never   Tobacco comments:    Married, lives with spouse (when he is not traveling) and son  Vaping Use   Vaping status: Never Used  Substance and Sexual Activity   Alcohol use: No   Drug use: No   Sexual activity: Not Currently    Partners: Male    Birth control/protection: None  Other Topics Concern   Not on file  Social History Narrative   Right handed      Two story home   Lives with husband   No caffeine   Disablitiy    Social Drivers of Health   Tobacco Use: Medium Risk (03/26/2024)   Patient History    Smoking Tobacco Use: Former    Smokeless Tobacco Use: Never    Passive Exposure: Not on Actuary Strain: Low Risk (02/10/2023)   Overall Financial Resource Strain (CARDIA)    Difficulty of Paying Living Expenses: Not hard at all  Food Insecurity: No Food Insecurity (02/11/2024)   Epic    Worried About Programme Researcher, Broadcasting/film/video in the Last Year: Never true    Ran Out of Food in the Last Year: Never true  Recent Concern: Food Insecurity - Food Insecurity Present (12/23/2023)   Epic    Worried About Patent Examiner in the Last Year: Sometimes true    Ran Out of Food in the Last Year: Sometimes true  Transportation Needs: No Transportation Needs (02/11/2024)   Epic    Lack of Transportation (Medical): No    Lack of Transportation (Non-Medical): No  Physical Activity: Inactive (02/11/2024)   Exercise Vital Sign    Days of Exercise per Week: 0 days    Minutes of Exercise per Session: 0 min  Stress: No Stress Concern Present (02/11/2024)  [employer] of Occupational Health - Occupational Stress Questionnaire    Feeling of Stress: Only a little  Recent Concern: Stress - Stress Concern Present (12/23/2023)   [employer] of Occupational Health - Occupational Stress Questionnaire    Feeling of Stress: Very much  Social Connections: Socially Isolated (02/11/2024)   Social Connection and Isolation Panel    Frequency of Communication with Friends and Family: Once a week    Frequency of Social Gatherings with Friends and Family: Once a week    Attends Religious Services: Never    Database Administrator or Organizations: No    Attends Banker Meetings: Never    Marital Status: Married  Depression (PHQ2-9): High Risk (03/15/2024)   Depression (PHQ2-9)    PHQ-2 Score: 11  Alcohol Screen: Low Risk (02/10/2023)   Alcohol Screen    Last Alcohol Screening Score (AUDIT): 0  Housing: Low Risk (03/17/2024)   Epic    Unable to Pay for Housing in the Last Year: No    Number of Times Moved in the Last Year: 0    Homeless in the Last Year: No  Utilities: Not At Risk (02/11/2024)   Epic    Threatened with loss of utilities: No  Health Literacy: Inadequate Health Literacy (02/11/2024)   B1300 Health Literacy    Frequency of need for help with medical instructions: Often    Allergies:  Allergies  Allergen Reactions   Fluoxetine Other (See Comments)    More depressed   Cymbalta [Duloxetine Hcl] Other (See Comments)    depressed   Porcine (Pork) Protein-Containing Drug  Products Other (See Comments)    Religious beliefs- does not eat pork  Medications ok if necessary    Metabolic Disorder Labs: Lab Results  Component Value Date   HGBA1C 6.3 11/06/2023   MPG 142.72 12/07/2022   MPG 140 05/02/2022   No results found for: PROLACTIN Lab Results  Component Value Date   CHOL 156 01/08/2024   TRIG 69.0 01/08/2024   HDL 61.70 01/08/2024   CHOLHDL 3 01/08/2024   VLDL 13.8 01/08/2024   LDLCALC 81 01/08/2024   LDLCALC 117 (H) 08/13/2022   Lab Results  Component Value Date   TSH 2.26 01/08/2024   TSH 4.29 01/15/2023    Therapeutic Level Labs: No results found for: LITHIUM No results found for: VALPROATE No results found for: CBMZ  Current Medications: Current Outpatient Medications  Medication Sig Dispense Refill   amphetamine -dextroamphetamine  (ADDERALL) 10 MG tablet Take 1 tablet (10 mg total) by mouth daily with breakfast. 14 tablet 0   amphetamine -dextroamphetamine  (ADDERALL) 15 MG tablet Take 1 tablet by mouth daily. 30 tablet 0   ACCU-CHEK GUIDE TEST test strip TEST BLOOD SUGARS MORNING, NOON, AND AT BEDTIME 100 strip 11   Accu-Chek Softclix Lancets lancets Use as instructed 100 each 12   AMBULATORY NON FORMULARY MEDICATION Off-loading wheel chair seat cusion Dispense 1 Dx code: M53.3 Use as needed 1 Units 0   amiodarone  (PACERONE ) 200 MG tablet Take 1 tablet (200 mg total) by mouth daily. 30 tablet 3   apixaban  (ELIQUIS ) 5 MG TABS tablet Take 1 tablet (5 mg total) by mouth 2 (two) times daily. 60 tablet 3   atorvastatin  (LIPITOR ) 80 MG tablet TAKE 1 TABLET BY MOUTH EVERY DAY 90 tablet 0   B Complex-C (B-COMPLEX WITH VITAMIN C ) tablet Take 1 tablet by mouth daily.     Blood Glucose Monitoring Suppl DEVI 1 each by Does not apply route in  the morning, at noon, and at bedtime. May substitute to any manufacturer covered by patient's insurance. 1 each 0   Cholecalciferol (VITAMIN D3) 50 MCG (2000 UT) capsule Take 1 capsule (2,000  Units total) by mouth daily.     clotrimazole -betamethasone  (LOTRISONE ) cream Apply 1 Application topically 2 (two) times daily. 60 g 0   collagenase  (SANTYL ) 250 UNIT/GM ointment Apply 1 Application topically daily.     dapagliflozin  propanediol (FARXIGA ) 10 MG TABS tablet TAKE 1 TABLET BY MOUTH EVERY DAY 90 tablet 3   fluconazole  (DIFLUCAN ) 100 MG tablet Take 2 tabs on day#1, then 1 tab daily on Days #2-7 8 tablet 1   furosemide  (LASIX ) 40 MG tablet Take 1 tablet (40 mg total) by mouth daily. 90 tablet 3   gentamicin  ointment (GARAMYCIN ) 0.1 % Apply 1 Application topically at bedtime.     hydrOXYzine  (ATARAX ) 25 MG tablet TAKE 1 TABLET BY MOUTH THREE TIMES A DAY AS NEEDED 270 tablet 0   linaclotide  (LINZESS ) 145 MCG CAPS capsule Take 1 capsule (145 mcg total) by mouth daily before breakfast. 90 capsule 1   losartan  (COZAAR ) 25 MG tablet TAKE 1/2 TABLET BY MOUTH EVERY DAY 90 tablet 1   magnesium  oxide (MAG-OX) 400 MG tablet Take 1 tablet (400 mg total) by mouth at bedtime. 90 tablet 1   meclizine  (ANTIVERT ) 12.5 MG tablet Take 1 tablet (12.5 mg total) by mouth 3 (three) times daily as needed. 40 tablet 1   metoprolol  succinate (TOPROL -XL) 50 MG 24 hr tablet Take 1 tablet (50 mg total) by mouth 2 (two) times daily. TAKE WITH OR IMMEDIATELY FOLLOWING A MEAL.     NON FORMULARY Pt uses cpap nightly     phentermine  (ADIPEX-P ) 37.5 MG tablet Take 1 tablet (37.5 mg total) by mouth daily before breakfast. 90 tablet 0   polyethylene glycol powder (GLYCOLAX /MIRALAX ) 17 GM/SCOOP powder Take 1 capful (17 g) in water 2 (two) times daily. 765 g 0   potassium chloride  SA (KLOR-CON  M) 20 MEQ tablet TAKE 1 TABLET BY MOUTH EVERY DAY 90 tablet 3   pregabalin  (LYRICA ) 75 MG capsule Take 1 capsule (75 mg total) by mouth at bedtime as needed (nerve pain). 30 capsule 3   spironolactone  (ALDACTONE ) 25 MG tablet TAKE 1/2 TABLET BY MOUTH EVERY DAY 45 tablet 2   tirzepatide  (MOUNJARO ) 5 MG/0.5ML Pen Inject 5 mg into the  skin once a week. 2 mL 1   zinc  gluconate 50 MG tablet Take 1 tablet (50 mg total) by mouth daily. 90 tablet 1   No current facility-administered medications for this visit.     Musculoskeletal: Strength & Muscle Tone: within normal limits Gait & Station: unsteady, patient ambulates with a cane Patient leans: Left  Psychiatric Specialty Exam: Review of Systems  Psychiatric/Behavioral:  Positive for decreased concentration, dysphoric mood and sleep disturbance. Negative for hallucinations, self-injury and suicidal ideas. The patient is nervous/anxious. The patient is not hyperactive.     Blood pressure 134/87, pulse 86, temperature 98.1 F (36.7 C), temperature source Oral, height 5' 3 (1.6 m), weight 237 lb 12.8 oz (107.9 kg), SpO2 95%.Body mass index is 42.12 kg/m.  General Appearance: Casual  Eye Contact:  Good  Speech:  Clear and Coherent and Normal Rate  Volume:  Normal  Mood:  Anxious and Depressed  Affect:  Congruent  Thought Process:  Coherent, Goal Directed, and Descriptions of Associations: Tangential  Orientation:  Full (Time, Place, and Person)  Thought Content: WDL   Suicidal Thoughts:  No  Homicidal Thoughts:  No  Memory:  Immediate;   Fair Recent;   Fair Remote;   Fair  Judgement:  Fair  Insight:  Lacking  Psychomotor Activity:  Normal  Concentration:  Concentration: Fair and Attention Span: Fair  Recall:  [employer] of Knowledge: Good  Language: Good  Akathisia:  No  Handed:  Right  AIMS (if indicated): not done  Assets:  Communication Skills Desire for Improvement Financial Resources/Insurance Housing Intimacy Social Support Transportation  ADL's:  Intact  Cognition: WNL  Sleep:  Fair   Screenings: AUDIT    Flowsheet Row [facility]  Alcohol Use Disorder Identification Test Final Score (AUDIT) 0   GAD-7    Flowsheet Row Clinical Support from 03/03/2024 in [facility] Patient Outreach Telephone from 02/02/2024 in [place] [facility]PART[facility] Clinical Support from 01/16/2024 in [facility] Patient Outreach Telephone from 12/23/2023 in [place] [facility] Office Visit from 12/19/2023 in [facility]  Total GAD-7 Score 17 13 18 17 18    Mini-Mental    Flowsheet Row Office Visit from 07/14/2023 in [facility] Neurology  Total Score (max 30 points ) 28   PHQ2-9    Flowsheet Row Clinical Support from 03/03/2024 in [facility] Clinical Support from 02/11/2024 in [facility] Patient Outreach Telephone from 02/02/2024 in [place] POPULATION [facility]ALTH DEPARTMENT Clinical Support from 01/16/2024 in [facility] Patient Outreach Telephone from 12/23/2023 in [place] POPULATION HEALTH DEPARTMENT  PHQ-2 Total Score 6 0 4 6 6   PHQ-9 Total Score 23 5 15 21 13    Flowsheet Row Clinical Support from 03/03/2024 in [facility] Clinical Support from 01/16/2024 in [facility] Admission (Discharged) from 11/25/2023 in [facility] CARDIAC CATH LAB  C-SSRS RISK CATEGORY Moderate Risk Moderate Risk No Risk     Assessment and Plan:   [name] is a 59 year old female with a past psychiatric history significant for 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  Patient is not currently being managed on any medications at this time.  Prior to the encounter, provider reached out to patient's cardiologist (Dr. Lavona) about whether or not patient should be placed on Adderall given her past history of  cardiomyopathy.  Patient's cardiologist gave permission for the patient to take Adderall for the management of her attention deficit disorder.  Patient presents to the encounter requesting to be placed on a stimulant for the management of her attention deficit disorder.  She informed provider that she sees her cardiologist (Dr. Lavona) regularly and has her heart monitor daily as well as blood pressure checked at least once a week.  Provider informed patient of the risk associated with individuals who are on stimulants with a history of cardiomyopathy.  Patient verbalized understanding.  In addition to struggling with being easily distracted, patient reports that she is overly stressed due to the social political climate.  She reports that she tries to avoid all politics to avoid becoming even more stressed.  She continues to endorse ongoing depression and elevated anxiety attributed to being on ongoing pain and having to undergo wound care regularly.  Despite her worsening depression and  anxiety, patient denies suicidal ideations.  A PHQ-9 screen was performed with the patient scoring a 23.  A GAD-7 screen was also performed with the patient scoring a 17.  After a lengthy discussion with the patient's cardiologist and the patient, patient to be placed on Adderall 10 mg daily for 14 days continue taking 15 mg daily for the management of her attention deficit disorder.  Patient vocalized understanding.  Patient's medication to be e-prescribed to pharmacy of choice.  Patient is currently being seen by cardiology by Dr. Lavona.  It should be noted that patient has a history of an ICD (implantable cardioverter-defibrillator) which was placed by Dr. Soyla Bones.  Patient was explained the risk of taking a stimulants while being treated for cardiomyopathy.  Patient verbalized understanding.  A [place] Suicide Severity Rating Scale was performed with the patient being considered moderate risk.  Patient  denies suicidal ideations and is able to contract for safety following the conclusion of the encounter.  Safety planning was discussed with the patient following the conclusion of the encounter:  - Patient was instructed to contact 911 in case of a mental health crisis - Patient was instructed to contact [facility] in the event of a mental health crisis - Patient was instructed to present to [facility] in the event of a mental health crisis  Collaboration of Care: Collaboration of Care: Primary Care Provider AEB provider being seen by a primary care provider (internal medicine), Psychiatrist AEB patient being seen by a mental health provider at this facility, Other provider involved in patient's care AEB patient being seen by cardiology and rehabilitation, and Referral or follow-up with counselor/therapist AEB patient being seen by a licensed clinical social worker at this facility  Patient/Guardian was advised Release of Information must be obtained prior to any record release in order to collaborate their care with an outside provider. Patient/Guardian was advised if they have not already done so to contact the registration department to sign all necessary forms in order for us  to release information regarding their care.   Consent: Patient/Guardian gives verbal consent for treatment and assignment of benefits for services provided during this visit. Patient/Guardian expressed understanding and agreed to proceed.   1. ADD (attention deficit disorder) without hyperactivity (Primary)  - amphetamine -dextroamphetamine  (ADDERALL) 10 MG tablet; Take 1 tablet (10 mg total) by mouth daily with breakfast.  Dispense: 14 tablet; Refill: 0 - amphetamine -dextroamphetamine  (ADDERALL) 15 MG tablet; Take 1 tablet by mouth daily.  Dispense: 30 tablet; Refill: 0  2. Major depressive disorder, recurrent episode, severe with anxious distress (HCC)  3.  Anxiety  Patient to follow-up in 4 weeks Provider spent a total of 60+ minutes with the patient/reviewing patient's chart  Reginia FORBES Bolster, PA 03/03/2024, 4:00 PM

## 2024-03-30 ENCOUNTER — Encounter (HOSPITAL_BASED_OUTPATIENT_CLINIC_OR_DEPARTMENT_OTHER): Payer: Self-pay

## 2024-03-30 ENCOUNTER — Ambulatory Visit (HOSPITAL_BASED_OUTPATIENT_CLINIC_OR_DEPARTMENT_OTHER)

## 2024-03-30 DIAGNOSIS — G8929 Other chronic pain: Secondary | ICD-10-CM

## 2024-03-30 DIAGNOSIS — M5459 Other low back pain: Secondary | ICD-10-CM

## 2024-03-30 DIAGNOSIS — R2689 Other abnormalities of gait and mobility: Secondary | ICD-10-CM

## 2024-03-30 DIAGNOSIS — M6281 Muscle weakness (generalized): Secondary | ICD-10-CM

## 2024-03-31 ENCOUNTER — Ambulatory Visit (HOSPITAL_COMMUNITY): Admitting: Mental Health

## 2024-03-31 NOTE — Progress Notes (Unsigned)
" ° °  THERAPIST PROGRESS NOTE  Session Time: ***  Participation Level: {BHH PARTICIPATION LEVEL:22264}  Behavioral Response: {Appearance:22683}{BHH LEVEL OF CONSCIOUSNESS:22305}{BHH MOOD:22306}  Type of Therapy: {CHL AMB BH Type of Therapy:21022741}  Treatment Goals addressed: ***  ProgressTowards Goals: {Progress Towards Goals:21014066}  Interventions: {CHL AMB BH Type of Intervention:21022753}  Summary: [name] is a 59 y.o. female who presents with ***.   Suicidal/Homicidal: {BHH YES OR NO:22294}{yes/no/with/without intent/plan:22693}  Therapist Response: ***  Plan: Return again in *** weeks.  Diagnosis: No diagnosis found.  Collaboration of Care: {BH OP Collaboration of Care:21014065}  Patient/Guardian was advised Release of Information must be obtained prior to any record release in order to collaborate their care with an outside provider. Patient/Guardian was advised if they have not already done so to contact the registration department to sign all necessary forms in order for us  to release information regarding their care.   Consent: Patient/Guardian gives verbal consent for treatment and assignment of benefits for services provided during this visit. Patient/Guardian expressed understanding and agreed to proceed.   Ty Asal Marshall, [facility] 03/31/2024  "

## 2024-04-01 ENCOUNTER — Ambulatory Visit (HOSPITAL_COMMUNITY): Admitting: Physician Assistant

## 2024-04-01 ENCOUNTER — Encounter (HOSPITAL_COMMUNITY): Payer: Self-pay | Admitting: Physician Assistant

## 2024-04-01 VITALS — BP 127/93 | HR 88 | Temp 98.1°F | Ht 63.0 in | Wt 237.4 lb

## 2024-04-01 DIAGNOSIS — F332 Major depressive disorder, recurrent severe without psychotic features: Secondary | ICD-10-CM

## 2024-04-01 DIAGNOSIS — F419 Anxiety disorder, unspecified: Secondary | ICD-10-CM

## 2024-04-01 DIAGNOSIS — F988 Other specified behavioral and emotional disorders with onset usually occurring in childhood and adolescence: Secondary | ICD-10-CM

## 2024-04-01 MED ORDER — AMPHETAMINE-DEXTROAMPHET ER 20 MG PO CP24: 20.0000 mg | ORAL_CAPSULE | Freq: Every day | ORAL | 0 refills | Status: AC

## 2024-04-01 MED ORDER — AMPHETAMINE-DEXTROAMPHET ER 25 MG PO CP24: 25.0000 mg | ORAL_CAPSULE | ORAL | 0 refills | Status: AC

## 2024-04-01 NOTE — Progress Notes (Unsigned)
 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  Patient is not currently being managed on any psychiatric medications at this time.  Patient presents to the encounter stating that she does not feel any better since being placed on Adderall.  She reports that when she was last on Adderall, the medication was not truly effective until she was taking 25 mg daily.  She reports that she continues to feel stressed and anxious and rates her anxiety a 9 out of 10.  Contributing factors to her anxiety include her unmanaged ADD, ongoing pain, and the social political landscape of the country.  Patient expresses having some doubts regarding her ADD diagnosis.  She reports that her use of Adderall has worked well for her in the past.  She reports that her life has been a struggle as of late.  She reports that her memory loss is so much more significant since her health has taken a decline.  She reports that the tools that she was previously used to help with her ADD symptoms are difficult for her to use currently.  In the past, patient reports that she would use calendars (to organize her schedule), phones (to use as a reminder), and her daily routines to keep her ADD at bay.  She reports that her lack of memory has rendered these tools useless.  She reports that she has difficulty recalling specific words and states that she has become bad with names even though she used to be able to recall names from her past.  She reports that her ADD is holding her back and  limits her.  She reports that her ADD symptoms affect her 24 hours of the day.  She describes herself as having racing thoughts.  She also feels like a burden to her husband and son and feels like they would be so much better without her.  In addition to her ADD symptoms, patient reports that her empathetic nature greatly hinders her.  She reports that she is often sad due to being surrounded by so many evil people in the world.  Patient finds herself procrastinating and waiting to the last minute to make her appointments.  She reports that she feels stagnant and stuck in the same place.  She reports that she is tired of feeling like a bitch towards her husband and states that she feels guilty for the way she is.  Patient informed provider that she used to be on Adderall extended release instead of immediate release.  Patient also inquired if provider had access to her previous charts from her previous psychiatric provider to determine what other psychiatric medications she was placed on in the past.  Patient is hopeful that her use of Adderall will help to maintain her current symptoms, but appears open to being placed on other psychiatric medications that may be able to manage her mood.  A PHQ-9 screen was performed with the patient scoring a 21.  A GAD-7 screen was also performed with the patient scoring an 18.  Patient is alert and oriented x 4, calm, cooperative, and fully  engaged in conversation during the encounter.  Patient describes her mood as hopeless.  Patient exhibits depressed mood with tearful affect.  Patient denies suicidal or homicidal ideations.  She further denies auditory or visual hallucinations and does not appear to be responding to internal/external stimuli.  Patient endorses poor sleep due to ongoing pain.  She reports that she always gets up throughout the night to go to the bathroom.  Patient endorses fair appetite and eats on average 2 meals per day.  Patient denies alcohol  consumption, tobacco use, or illicit drug use.  Visit Diagnosis:    ICD-10-CM   1. ADD (attention deficit disorder) without hyperactivity  F98.8 amphetamine -dextroamphetamine  (ADDERALL XR) 20 MG 24 hr capsule    amphetamine -dextroamphetamine  (ADDERALL XR) 25 MG 24 hr capsule    2. Major depressive disorder, recurrent episode, severe with anxious distress (HCC)  F33.2     3. Anxiety  F41.9       Past Psychiatric History:  Patient has a past psychiatric history significant for Major depressive disorder, anxiety, possible OCD, and ADD.   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 Image   quality was suboptimal. The study was technically difficult.   Intravenous contrast (Definity ) was administered. - Left ventricle: The cavity size was moderately dilated. Wall   thickness was increased in a pattern of mild LVH. Systolic   function was moderately to severely reduced. The estimated   ejection fraction w   Normal coronary arteries 04/17/2016   OSA on CPAP 10/28/2014   does not use CPAP- pt cannot tolerate CPAP    Osteomyelitis of tibia (HCC) 07/15/2023   Pseudomonas infection 04/29/2023   Spinal stenosis    back pain   Spondylosis without myelopathy or radiculopathy, lumbar region 12/04/2017   Type 2 diabetes mellitus with hyperglycemia (HCC)    Walker as ambulation aid     Past Surgical History:  Procedure Laterality Date   CHOLECYSTECTOMY     DILATATION & CURETTAGE/HYSTEROSCOPY WITH MYOSURE N/A 10/31/2017   Procedure: DILATATION & CURETTAGE/HYSTEROSCOPY WITH MYOSURE;  Surgeon: Jannis Kate Norris, MD;  Location: [facility] OR[facility];  Service: Gynecology;  Laterality: N/A;   HYSTEROSCOPY WITH D & C N/A 05/07/2022   Procedure: DILATATION AND CURETTAGE /HYSTEROSCOPY;  Surgeon: Jeralyn Crutch, MD;  Location: [facility] OR;  Service: Gynecology;  Laterality: N/A;   ICD IMPLANT N/A 11/25/2023   Procedure: ICD IMPLANT;  Surgeon: Inocencio Soyla Lunger, MD;  Location: [facility] INVASIVE CV LAB;  Service: Cardiovascular;  Laterality: N/A;   INCISION AND DRAINAGE OF WOUND Left 02/06/2023  Procedure: debridement of left leg wound, application of myriad;  Surgeon: Waddell Leonce NOVAK, MD;  Location: [facility] OR;  Service: Plastics;  Laterality: Left;   INCISION AND DRAINAGE OF WOUND Left 02/24/2023   Procedure: application of tissue replacement matrix of left patellar tendon;  Surgeon: Waddell Leonce NOVAK, MD;  Location: [facility] OR;  Service: Plastics;  Laterality: Left;   INCISION AND DRAINAGE OF WOUND Left 04/15/2023   Procedure: irrigation and debridement to left lower extremity wound;  Surgeon: Waddell Leonce NOVAK, MD;  Location: [facility] OR;  Service: Plastics;  Laterality: Left;   IRRIGATION AND DEBRIDEMENT KNEE  02/24/2023   Procedure: IRRIGATION AND DEBRIDEMENT KNEE;  Surgeon: Waddell Leonce NOVAK, MD;  Location: [facility] OR;  Service: Plastics;;   life vest     RIGHT/LEFT HEART CATH AND CORONARY ANGIOGRAPHY N/A 04/11/2016   Procedure: Right/Left Heart Cath and Coronary Angiography;  Surgeon: Lonni JONETTA Cash, MD;  Location: [facility] INVASIVE CV LAB;   Service: Cardiovascular;  Laterality: N/A;   RIGHT/LEFT HEART CATH AND CORONARY ANGIOGRAPHY N/A 12/09/2022   Procedure: RIGHT/LEFT HEART CATH AND CORONARY ANGIOGRAPHY;  Surgeon: Ladona Heinz, MD;  Location: [facility] INVASIVE CV LAB;  Service: Cardiovascular;  Laterality: N/A;   sonogram for blood clots     no blockages    Family Psychiatric History:  Patient reports that her mother had several psych issues including depression, anxiety, and possible OCD Father - OCD Son - Asperger's, ADD Grandmother (ma) - depression, anxiety. History of some form of electric/stimulation therapy 2 Uncles - depression, anxiety 2nd cousins - OCD, ADD, depression, anxiety  Family History:  Family History  Problem Relation Age of Onset   Depression Mother    Anxiety disorder Mother    Diabetes Mother    Hypertension Mother    ADD / ADHD Father    Diabetes Father    Hypertension Father    Colitis Father    Hypertension Other    Diabetes Other    Colitis Other    Alcohol abuse Other    Breast cancer Neg Hx    BRCA 1/2 Neg Hx     Social History:  Social History   Socioeconomic History   Marital status: Married    Spouse name: [name]   Number of children: 1   Years of education: 16   Highest education level: Associate degree: academic program  Occupational History   Not on file  Tobacco Use   Smoking status: Former    Current packs/day: 0.00    Types: Cigarettes    Quit date: 02/25/1997    Years since quitting: 27.1   Smokeless tobacco: Never   Tobacco comments:    Married, lives with spouse (when he is not traveling) and son  Vaping Use   Vaping status: Never Used  Substance and Sexual Activity   Alcohol use: No   Drug use: No   Sexual activity: Not Currently    Partners: Male    Birth control/protection: None  Other Topics Concern   Not on file  Social History Narrative   Right handed      Two story home   Lives with husband   No caffeine   Disablitiy    Social Drivers of  Health   Tobacco Use: Medium Risk (04/01/2024)   Patient History    Smoking Tobacco Use: Former    Smokeless Tobacco Use: Never    Passive Exposure: Not on file  Financial Resource Strain: Low Risk (02/10/2023)   Overall Physicist, Medical Strain (  CARDIA)    Difficulty of Paying Living Expenses: Not hard at all  Food Insecurity: No Food Insecurity (02/11/2024)   Epic    Worried About Programme Researcher, Broadcasting/film/video in the Last Year: Never true    Ran Out of Food in the Last Year: Never true  Recent Concern: Food Insecurity - Food Insecurity Present (12/23/2023)   Epic    Worried About Programme Researcher, Broadcasting/film/video in the Last Year: Sometimes true    [employer] of Food in the Last Year: Sometimes true  Transportation Needs: No Transportation Needs (02/11/2024)   Epic    Lack of Transportation (Medical): No    Lack of Transportation (Non-Medical): No  Physical Activity: Inactive (02/11/2024)   Exercise Vital Sign    Days of Exercise per Week: 0 days    Minutes of Exercise per Session: 0 min  Stress: No Stress Concern Present (02/11/2024)   [employer] of Occupational Health - Occupational Stress Questionnaire    Feeling of Stress: Only a little  Recent Concern: Stress - Stress Concern Present (12/23/2023)   [employer] of Occupational Health - Occupational Stress Questionnaire    Feeling of Stress: Very much  Social Connections: Socially Isolated (02/11/2024)   Social Connection and Isolation Panel    Frequency of Communication with Friends and Family: Once a week    Frequency of Social Gatherings with Friends and Family: Once a week    Attends Religious Services: Never    Database Administrator or Organizations: No    Attends Banker Meetings: Never    Marital Status: Married  Depression (PHQ2-9): High Risk (04/01/2024)   Depression (PHQ2-9)    PHQ-2 Score: 21  Alcohol Screen: Low Risk (02/10/2023)   Alcohol Screen    Last Alcohol Screening Score (AUDIT): 0  Housing: Low Risk  (03/17/2024)   Epic    Unable to Pay for Housing in the Last Year: No    Number of Times Moved in the Last Year: 0    Homeless in the Last Year: No  Utilities: Not At Risk (02/11/2024)   Epic    Threatened with loss of utilities: No  Health Literacy: Inadequate Health Literacy (02/11/2024)   B1300 Health Literacy    Frequency of need for help with medical instructions: Often    Allergies:  Allergies  Allergen Reactions   Fluoxetine Other (See Comments)    More depressed   Cymbalta [Duloxetine Hcl] Other (See Comments)    depressed   Porcine (Pork) Protein-Containing Drug Products Other (See Comments)    Religious beliefs- does not eat pork  Medications ok if necessary    Metabolic Disorder Labs: Lab Results  Component Value Date   HGBA1C 6.3 11/06/2023   MPG 142.72 12/07/2022   MPG 140 05/02/2022   No results found for: PROLACTIN Lab Results  Component Value Date   CHOL 156 01/08/2024   TRIG 69.0 01/08/2024   HDL 61.70 01/08/2024   CHOLHDL 3 01/08/2024   VLDL 13.8 01/08/2024   LDLCALC 81 01/08/2024   LDLCALC 117 (H) 08/13/2022   Lab Results  Component Value Date   TSH 2.26 01/08/2024   TSH 4.29 01/15/2023    Therapeutic Level Labs: No results found for: LITHIUM No results found for: VALPROATE No results found for: CBMZ  Current Medications: Current Outpatient Medications  Medication Sig Dispense Refill   amphetamine -dextroamphetamine  (ADDERALL XR) 20 MG 24 hr capsule Take 1 capsule (20 mg total) by mouth daily. 7 capsule 0   [  START ON 04/08/2024] amphetamine -dextroamphetamine  (ADDERALL XR) 25 MG 24 hr capsule Take 1 capsule by mouth every morning. 30 capsule 0   ACCU-CHEK GUIDE TEST test strip TEST BLOOD SUGARS MORNING, NOON, AND AT BEDTIME 100 strip 11   Accu-Chek Softclix Lancets lancets Use as instructed 100 each 12   AMBULATORY NON FORMULARY MEDICATION Off-loading wheel chair seat cusion Dispense 1 Dx code: M53.3 Use as needed 1 Units 0    amiodarone  (PACERONE ) 200 MG tablet Take 1 tablet (200 mg total) by mouth daily. 30 tablet 3   apixaban  (ELIQUIS ) 5 MG TABS tablet Take 1 tablet (5 mg total) by mouth 2 (two) times daily. 60 tablet 3   atorvastatin  (LIPITOR ) 80 MG tablet TAKE 1 TABLET BY MOUTH EVERY DAY 90 tablet 0   B Complex-C (B-COMPLEX WITH VITAMIN C ) tablet Take 1 tablet by mouth daily.     Blood Glucose Monitoring Suppl DEVI 1 each by Does not apply route in the morning, at noon, and at bedtime. May substitute to any manufacturer covered by patient's insurance. 1 each 0   Cholecalciferol (VITAMIN D3) 50 MCG (2000 UT) capsule Take 1 capsule (2,000 Units total) by mouth daily.     clotrimazole -betamethasone  (LOTRISONE ) cream Apply 1 Application topically 2 (two) times daily. 60 g 0   collagenase  (SANTYL ) 250 UNIT/GM ointment Apply 1 Application topically daily.     dapagliflozin  propanediol (FARXIGA ) 10 MG TABS tablet TAKE 1 TABLET BY MOUTH EVERY DAY 90 tablet 3   fluconazole  (DIFLUCAN ) 100 MG tablet Take 2 tabs on day#1, then 1 tab daily on Days #2-7 8 tablet 1   furosemide  (LASIX ) 40 MG tablet Take 1 tablet (40 mg total) by mouth daily. 90 tablet 3   gentamicin  ointment (GARAMYCIN ) 0.1 % Apply 1 Application topically at bedtime.     hydrOXYzine  (ATARAX ) 25 MG tablet TAKE 1 TABLET BY MOUTH THREE TIMES A DAY AS NEEDED 270 tablet 0   linaclotide  (LINZESS ) 145 MCG CAPS capsule Take 1 capsule (145 mcg total) by mouth daily before breakfast. 90 capsule 1   losartan  (COZAAR ) 25 MG tablet TAKE 1/2 TABLET BY MOUTH EVERY DAY 90 tablet 1   magnesium  oxide (MAG-OX) 400 MG tablet Take 1 tablet (400 mg total) by mouth at bedtime. 90 tablet 1   meclizine  (ANTIVERT ) 12.5 MG tablet Take 1 tablet (12.5 mg total) by mouth 3 (three) times daily as needed. 40 tablet 1   metoprolol  succinate (TOPROL -XL) 50 MG 24 hr tablet Take 1 tablet (50 mg total) by mouth 2 (two) times daily. TAKE WITH OR IMMEDIATELY FOLLOWING A MEAL.     NON FORMULARY Pt uses  cpap nightly     phentermine  (ADIPEX-P ) 37.5 MG tablet Take 1 tablet (37.5 mg total) by mouth daily before breakfast. 90 tablet 0   polyethylene glycol powder (GLYCOLAX /MIRALAX ) 17 GM/SCOOP powder Take 1 capful (17 g) in water 2 (two) times daily. 765 g 0   potassium chloride  SA (KLOR-CON  M) 20 MEQ tablet TAKE 1 TABLET BY MOUTH EVERY DAY 90 tablet 3   pregabalin  (LYRICA ) 75 MG capsule Take 1 capsule (75 mg total) by mouth at bedtime as needed (nerve pain). 30 capsule 3   spironolactone  (ALDACTONE ) 25 MG tablet TAKE 1/2 TABLET BY MOUTH EVERY DAY 45 tablet 2   tirzepatide  (MOUNJARO ) 5 MG/0.5ML Pen Inject 5 mg into the skin once a week. 2 mL 1   zinc  gluconate 50 MG tablet Take 1 tablet (50 mg total) by mouth daily. 90 tablet 1  No current facility-administered medications for this visit.     Musculoskeletal: Strength & Muscle Tone: within normal limits Gait & Station: unsteady, patient ambulates with a cane Patient leans: Left  Psychiatric Specialty Exam: Review of Systems  Psychiatric/Behavioral:  Positive for decreased concentration, dysphoric mood and sleep disturbance. Negative for hallucinations, self-injury and suicidal ideas. The patient is nervous/anxious. The patient is not hyperactive.     Blood pressure (!) 127/93, pulse 88, temperature 98.1 F (36.7 C), temperature source Oral, height 5' 3 (1.6 m), weight 237 lb 6.4 oz (107.7 kg), SpO2 98%.Body mass index is 42.05 kg/m.  General Appearance: Casual  Eye Contact:  Good  Speech:  Clear and Coherent and Normal Rate  Volume:  Normal  Mood:  Anxious and Depressed  Affect:  Congruent and Tearful  Thought Process:  Coherent, Goal Directed, and Descriptions of Associations: Tangential  Orientation:  Full (Time, Place, and Person)  Thought Content: WDL   Suicidal Thoughts:  No  Homicidal Thoughts:  No  Memory:  Immediate;   Fair Recent;   Fair Remote;   Fair  Judgement:  Fair  Insight:  Lacking  Psychomotor Activity:   Normal  Concentration:  Concentration: Fair and Attention Span: Fair  Recall:  [employer] of Knowledge: Good  Language: Good  Akathisia:  No  Handed:  Right  AIMS (if indicated): not done  Assets:  Communication Skills Desire for Improvement Financial Resources/Insurance Housing Intimacy Social Support Transportation  ADL's:  Intact  Cognition: WNL  Sleep:  Fair   Screenings: AUDIT    Flowsheet Row [facility]  Alcohol Use Disorder Identification Test Final Score (AUDIT) 0   GAD-7    Flowsheet Row Clinical Support from 04/01/2024 in [facility] Patient Outreach Telephone from 03/15/2024 in [place] POPULATION HEALTH DEPARTMENT Clinical Support from 03/03/2024 in [facility] Patient Outreach Telephone from 02/02/2024 in [place] POPULATION HEALTH DEPARTMENT Clinical Support from 01/16/2024 in [facility]  Total GAD-7 Score 18 11 17 13 18    Mini-Mental    Flowsheet Row Office Visit from 07/14/2023 in [facility] Neurology  Total Score (max 30 points ) 28   PHQ2-9    Flowsheet Row Clinical Support from 04/01/2024 in [facility] Patient Outreach Telephone from 03/15/2024 in [place] POPULATION HEALTH DEPARTMENT Clinical Support from 03/03/2024 in [facility] Clinical Support from 02/11/2024 in [facility] Patient Outreach Telephone from 02/02/2024 in [place] POPULATION HEALTH DEPARTMENT  PHQ-2 Total Score 6 4 6  0 4  PHQ-9 Total Score 21 11 23 5 15    Flowsheet Row Clinical Support from 04/01/2024 in [facility] Clinical Support from 03/03/2024 in [facility] Clinical Support from 01/16/2024 in [facility]  C-SSRS RISK CATEGORY Moderate  Risk Moderate Risk Moderate Risk     Assessment and Plan:   [name] is a 59 year old female with a past psychiatric history significant for 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  Patient presents to the encounter stating that she has been taking her Adderall regularly.  She reports the medication has been ineffective in managing her symptoms of ADD.  She denies experiencing any adverse side effects associated with  the use of the medication.  She informed provider that back when she used to be on Adderall, she was taking the extended release formulation of the medication.  As previously mentioned, patient presented to the encounter continuing to struggle with her symptoms of ADD.  She endorses difficulty with concentration, racing thoughts, procrastinating, and forgetfulness.  She reports that tools that she used to use in the past (calendars, or phone, daily routines) have become difficult to use due to the worsening of her symptoms.  Patient continues to exhibit depressive symptoms and was tearful throughout the encounter.  Patient also exhibits anxiety due to her current life stressors.  A PHQ-9 screen was performed with the patient scoring a 21.  A GAD-7 screen was also performed with the patient scoring an 18.  Patient informed provider that her use of Adderall was mostly effective at 25 mg daily.  She also informed provider that she was previously on the extended release formulation of the medication.  She reports that she has discussed the use of Adderall with her cardiologist (Dr. Lavona) even though she has a history of cardiomyopathy that he is currently overseeing.  She reports that her cardiologist gave her the approval to continue using Adderall as it may be beneficial for her mental  stability.  Patient informed provider that she used to be seen by Dr. Rudy for psychiatry.  She reports that she is interested in potentially using any medication that she may have used in the past to help with the current symptoms. - Per chart review, patient has been on Zoloft , BuSpar , Abilify , Lamictal , Ativan , Trintellix , and Cymbalta.  For the patient's medication management, provider to change patient's Adderall from immediate release to extended release.  Patient to be placed on Adderall XR 20 mg for 7 days, then continue taking Adderall XR 25 mg daily.  Patient vocalized understanding.  Due to patient's ongoing depression and anxiety, Adderall XR 25 mg daily may not prove effective in managing her ongoing symptoms.  Provider to look into other options for managing patient's depression and anxiety.  A [place] Suicide Severity Rating Scale was performed with the patient being considered moderate risk.  Patient denies suicidal ideations and is able to contract for safety following the conclusion of the encounter.  Safety planning was discussed with the patient following the conclusion of the encounter:  - Patient was instructed to contact 911 in case of a mental health crisis - Patient was instructed to contact [facility] [facility] [employer] in the event of a mental health crisis - Patient was instructed to present to [facility] in the event of a mental health crisis  Collaboration of Care: Collaboration of Care: Primary Care Provider AEB provider being seen by a primary care provider (internal medicine), Psychiatrist AEB patient being seen by a mental health provider at this facility, Other provider involved in patient's care AEB patient being seen by cardiology and rehabilitation, and Referral or follow-up with counselor/therapist AEB patient being seen by a licensed clinical social worker at this facility  Patient/Guardian was advised Release of Information  must be obtained prior to any record release in order to collaborate their care with an outside provider. Patient/Guardian was advised if they have not already done so to contact the registration department to sign all necessary forms in order for us  to release information regarding their care.   Consent: Patient/Guardian gives verbal consent for treatment and assignment of benefits for services provided during this visit.  Patient/Guardian expressed understanding and agreed to proceed.   1. ADD (attention deficit disorder) without hyperactivity (Primary)  - amphetamine -dextroamphetamine  (ADDERALL XR) 20 MG 24 hr capsule; Take 1 capsule (20 mg total) by mouth daily.  Dispense: 7 capsule; Refill: 0 - amphetamine -dextroamphetamine  (ADDERALL XR) 25 MG 24 hr capsule; Take 1 capsule by mouth every morning.  Dispense: 30 capsule; Refill: 0  2. Major depressive disorder, recurrent episode, severe with anxious distress (HCC)  3. Anxiety  Patient to follow-up in 4 weeks Provider spent a total of 60+ minutes with the patient/reviewing patient's chart  Reginia FORBES Bolster, PA 04/01/2024, 3:00 PM

## 2024-04-02 ENCOUNTER — Other Ambulatory Visit: Payer: Self-pay | Admitting: Internal Medicine

## 2024-04-02 DIAGNOSIS — F411 Generalized anxiety disorder: Secondary | ICD-10-CM

## 2024-04-05 ENCOUNTER — Ambulatory Visit (HOSPITAL_BASED_OUTPATIENT_CLINIC_OR_DEPARTMENT_OTHER)

## 2024-04-06 ENCOUNTER — Encounter

## 2024-04-08 ENCOUNTER — Encounter (HOSPITAL_BASED_OUTPATIENT_CLINIC_OR_DEPARTMENT_OTHER): Admitting: Internal Medicine

## 2024-04-08 ENCOUNTER — Encounter: Admitting: Psychology

## 2024-04-08 ENCOUNTER — Ambulatory Visit: Admitting: Gastroenterology

## 2024-04-09 ENCOUNTER — Encounter (HOSPITAL_BASED_OUTPATIENT_CLINIC_OR_DEPARTMENT_OTHER): Admitting: Physical Therapy

## 2024-04-13 ENCOUNTER — Encounter (HOSPITAL_BASED_OUTPATIENT_CLINIC_OR_DEPARTMENT_OTHER)

## 2024-04-15 ENCOUNTER — Encounter (HOSPITAL_BASED_OUTPATIENT_CLINIC_OR_DEPARTMENT_OTHER): Admitting: Physical Therapy

## 2024-04-19 ENCOUNTER — Encounter: Admitting: Psychology

## 2024-04-20 ENCOUNTER — Encounter (HOSPITAL_BASED_OUTPATIENT_CLINIC_OR_DEPARTMENT_OTHER)

## 2024-04-21 ENCOUNTER — Telehealth: Admitting: Licensed Clinical Social Worker

## 2024-04-23 ENCOUNTER — Encounter (HOSPITAL_BASED_OUTPATIENT_CLINIC_OR_DEPARTMENT_OTHER): Admitting: Physical Therapy

## 2024-04-23 ENCOUNTER — Ambulatory Visit: Admitting: Cardiology

## 2024-04-27 ENCOUNTER — Encounter (HOSPITAL_BASED_OUTPATIENT_CLINIC_OR_DEPARTMENT_OTHER): Admitting: Physical Therapy

## 2024-04-28 ENCOUNTER — Ambulatory Visit (HOSPITAL_COMMUNITY): Admitting: Mental Health

## 2024-04-29 ENCOUNTER — Encounter (HOSPITAL_BASED_OUTPATIENT_CLINIC_OR_DEPARTMENT_OTHER)

## 2024-05-03 ENCOUNTER — Ambulatory Visit: Admitting: Cardiology

## 2024-05-04 ENCOUNTER — Encounter (HOSPITAL_BASED_OUTPATIENT_CLINIC_OR_DEPARTMENT_OTHER): Admitting: Physical Therapy

## 2024-05-06 ENCOUNTER — Encounter (HOSPITAL_BASED_OUTPATIENT_CLINIC_OR_DEPARTMENT_OTHER)

## 2024-05-11 ENCOUNTER — Encounter (HOSPITAL_BASED_OUTPATIENT_CLINIC_OR_DEPARTMENT_OTHER): Admitting: Physical Therapy

## 2024-05-12 ENCOUNTER — Ambulatory Visit (HOSPITAL_COMMUNITY): Admitting: Mental Health

## 2024-05-13 ENCOUNTER — Encounter (HOSPITAL_BASED_OUTPATIENT_CLINIC_OR_DEPARTMENT_OTHER)

## 2024-05-14 ENCOUNTER — Encounter (HOSPITAL_COMMUNITY): Admitting: Physician Assistant

## 2024-05-18 ENCOUNTER — Encounter (HOSPITAL_BASED_OUTPATIENT_CLINIC_OR_DEPARTMENT_OTHER): Admitting: Physical Therapy

## 2024-05-20 ENCOUNTER — Encounter (HOSPITAL_BASED_OUTPATIENT_CLINIC_OR_DEPARTMENT_OTHER)

## 2024-05-25 ENCOUNTER — Encounter (HOSPITAL_BASED_OUTPATIENT_CLINIC_OR_DEPARTMENT_OTHER)

## 2024-05-26 ENCOUNTER — Ambulatory Visit (HOSPITAL_COMMUNITY): Admitting: Mental Health

## 2024-05-27 ENCOUNTER — Encounter (HOSPITAL_BASED_OUTPATIENT_CLINIC_OR_DEPARTMENT_OTHER): Admitting: Physical Therapy

## 2024-06-01 ENCOUNTER — Encounter (HOSPITAL_BASED_OUTPATIENT_CLINIC_OR_DEPARTMENT_OTHER)

## 2024-06-03 ENCOUNTER — Encounter (HOSPITAL_BASED_OUTPATIENT_CLINIC_OR_DEPARTMENT_OTHER): Admitting: Physical Therapy

## 2024-06-16 ENCOUNTER — Ambulatory Visit (HOSPITAL_COMMUNITY): Admitting: Mental Health

## 2024-06-24 ENCOUNTER — Ambulatory Visit: Admitting: Cardiology

## 2024-07-06 ENCOUNTER — Encounter

## 2024-07-14 ENCOUNTER — Ambulatory Visit: Admitting: Internal Medicine

## 2024-10-05 ENCOUNTER — Encounter

## 2025-01-04 ENCOUNTER — Encounter

## 2025-02-21 ENCOUNTER — Ambulatory Visit

## 2025-02-21 ENCOUNTER — Encounter: Admitting: Internal Medicine

## 2025-04-05 ENCOUNTER — Encounter
